# Patient Record
Sex: Female | Born: 1945 | Race: Black or African American | Hispanic: No | Smoking: Former smoker
Health system: Southern US, Community
[De-identification: ages and names within clinical notes are randomized; demographics above are authoritative.]

## PROBLEM LIST (undated history)

## (undated) DIAGNOSIS — R011 Cardiac murmur, unspecified: Secondary | ICD-10-CM

## (undated) DIAGNOSIS — C50919 Malignant neoplasm of unspecified site of unspecified female breast: Secondary | ICD-10-CM

## (undated) DIAGNOSIS — I82409 Acute embolism and thrombosis of unspecified deep veins of unspecified lower extremity: Secondary | ICD-10-CM

## (undated) DIAGNOSIS — K219 Gastro-esophageal reflux disease without esophagitis: Secondary | ICD-10-CM

## (undated) DIAGNOSIS — R51 Headache: Secondary | ICD-10-CM

## (undated) DIAGNOSIS — R6889 Other general symptoms and signs: Secondary | ICD-10-CM

## (undated) DIAGNOSIS — G4730 Sleep apnea, unspecified: Secondary | ICD-10-CM

## (undated) DIAGNOSIS — J189 Pneumonia, unspecified organism: Secondary | ICD-10-CM

## (undated) DIAGNOSIS — I509 Heart failure, unspecified: Secondary | ICD-10-CM

## (undated) DIAGNOSIS — I5020 Unspecified systolic (congestive) heart failure: Secondary | ICD-10-CM

## (undated) DIAGNOSIS — I10 Essential (primary) hypertension: Secondary | ICD-10-CM

## (undated) DIAGNOSIS — E669 Obesity, unspecified: Secondary | ICD-10-CM

## (undated) DIAGNOSIS — B353 Tinea pedis: Secondary | ICD-10-CM

## (undated) DIAGNOSIS — Z9221 Personal history of antineoplastic chemotherapy: Secondary | ICD-10-CM

## (undated) DIAGNOSIS — M2550 Pain in unspecified joint: Secondary | ICD-10-CM

## (undated) DIAGNOSIS — I5032 Chronic diastolic (congestive) heart failure: Secondary | ICD-10-CM

## (undated) DIAGNOSIS — T4145XA Adverse effect of unspecified anesthetic, initial encounter: Secondary | ICD-10-CM

## (undated) DIAGNOSIS — G629 Polyneuropathy, unspecified: Secondary | ICD-10-CM

## (undated) DIAGNOSIS — Z923 Personal history of irradiation: Secondary | ICD-10-CM

## (undated) DIAGNOSIS — R269 Unspecified abnormalities of gait and mobility: Secondary | ICD-10-CM

## (undated) DIAGNOSIS — R609 Edema, unspecified: Secondary | ICD-10-CM

## (undated) DIAGNOSIS — N189 Chronic kidney disease, unspecified: Secondary | ICD-10-CM

## (undated) DIAGNOSIS — H9190 Unspecified hearing loss, unspecified ear: Secondary | ICD-10-CM

## (undated) DIAGNOSIS — R519 Headache, unspecified: Secondary | ICD-10-CM

## (undated) HISTORY — PX: CATARACT EXTRACTION: SUR2

## (undated) HISTORY — DX: Acute embolism and thrombosis of unspecified deep veins of unspecified lower extremity: I82.409

## (undated) HISTORY — DX: Chronic kidney disease, unspecified: N18.9

## (undated) HISTORY — DX: Unspecified hearing loss, unspecified ear: H91.90

## (undated) HISTORY — DX: Essential (primary) hypertension: I10

## (undated) HISTORY — DX: Other general symptoms and signs: R68.89

## (undated) HISTORY — DX: Edema, unspecified: R60.9

## (undated) HISTORY — DX: Unspecified systolic (congestive) heart failure: I50.20

## (undated) HISTORY — DX: Tinea pedis: B35.3

## (undated) HISTORY — DX: Pain in unspecified joint: M25.50

## (undated) HISTORY — PX: HERNIA REPAIR: SHX51

## (undated) HISTORY — DX: Obesity, unspecified: E66.9

## (undated) HISTORY — DX: Sleep apnea, unspecified: G47.30

## (undated) HISTORY — DX: Polyneuropathy, unspecified: G62.9

## (undated) HISTORY — DX: Chronic diastolic (congestive) heart failure: I50.32

## (undated) HISTORY — DX: Malignant neoplasm of unspecified site of unspecified female breast: C50.919

## (undated) HISTORY — DX: Heart failure, unspecified: I50.9

## (undated) HISTORY — DX: Unspecified abnormalities of gait and mobility: R26.9

## (undated) HISTORY — PX: TUBAL LIGATION: SHX77

## (undated) HISTORY — DX: Gastro-esophageal reflux disease without esophagitis: K21.9

---
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
## 2004-09-08 DIAGNOSIS — C50919 Malignant neoplasm of unspecified site of unspecified female breast: Secondary | ICD-10-CM

## 2004-09-08 DIAGNOSIS — T8859XA Other complications of anesthesia, initial encounter: Secondary | ICD-10-CM

## 2004-09-08 DIAGNOSIS — Z9221 Personal history of antineoplastic chemotherapy: Secondary | ICD-10-CM

## 2004-09-08 DIAGNOSIS — Z923 Personal history of irradiation: Secondary | ICD-10-CM

## 2004-09-08 HISTORY — DX: Malignant neoplasm of unspecified site of unspecified female breast: C50.919

## 2004-09-08 HISTORY — DX: Personal history of antineoplastic chemotherapy: Z92.21

## 2004-09-08 HISTORY — DX: Other complications of anesthesia, initial encounter: T88.59XA

## 2004-09-08 HISTORY — PX: BREAST LUMPECTOMY: SHX2

## 2004-09-08 HISTORY — DX: Personal history of irradiation: Z92.3

## 2004-11-06 ENCOUNTER — Other Ambulatory Visit: Payer: Self-pay

## 2004-11-06 ENCOUNTER — Emergency Department: Payer: Self-pay | Admitting: Emergency Medicine

## 2004-12-25 ENCOUNTER — Ambulatory Visit: Payer: Self-pay | Admitting: Unknown Physician Specialty

## 2005-01-14 ENCOUNTER — Ambulatory Visit: Payer: Self-pay | Admitting: General Surgery

## 2005-01-31 ENCOUNTER — Ambulatory Visit: Payer: Self-pay | Admitting: General Surgery

## 2005-02-05 ENCOUNTER — Ambulatory Visit: Payer: Self-pay | Admitting: General Surgery

## 2005-02-21 ENCOUNTER — Ambulatory Visit: Payer: Self-pay | Admitting: Oncology

## 2005-02-25 ENCOUNTER — Other Ambulatory Visit: Payer: Self-pay

## 2005-02-28 ENCOUNTER — Ambulatory Visit: Payer: Self-pay | Admitting: General Surgery

## 2005-03-08 ENCOUNTER — Ambulatory Visit: Payer: Self-pay | Admitting: Oncology

## 2005-04-08 ENCOUNTER — Ambulatory Visit: Payer: Self-pay | Admitting: Oncology

## 2005-05-09 ENCOUNTER — Ambulatory Visit: Payer: Self-pay | Admitting: Oncology

## 2005-06-08 ENCOUNTER — Ambulatory Visit: Payer: Self-pay | Admitting: Oncology

## 2005-07-09 ENCOUNTER — Ambulatory Visit: Payer: Self-pay | Admitting: Oncology

## 2005-08-08 ENCOUNTER — Ambulatory Visit: Payer: Self-pay | Admitting: Oncology

## 2005-09-08 ENCOUNTER — Ambulatory Visit: Payer: Self-pay | Admitting: Oncology

## 2005-10-09 ENCOUNTER — Ambulatory Visit: Payer: Self-pay | Admitting: Oncology

## 2005-11-06 ENCOUNTER — Ambulatory Visit: Payer: Self-pay | Admitting: Oncology

## 2005-12-07 ENCOUNTER — Ambulatory Visit: Payer: Self-pay | Admitting: Oncology

## 2006-01-06 ENCOUNTER — Ambulatory Visit: Payer: Self-pay | Admitting: Oncology

## 2006-02-06 ENCOUNTER — Ambulatory Visit: Payer: Self-pay | Admitting: Oncology

## 2006-03-12 ENCOUNTER — Ambulatory Visit: Payer: Self-pay | Admitting: Oncology

## 2006-03-17 ENCOUNTER — Ambulatory Visit: Payer: Self-pay | Admitting: General Surgery

## 2006-04-01 ENCOUNTER — Ambulatory Visit: Payer: Self-pay | Admitting: Gastroenterology

## 2006-04-08 ENCOUNTER — Ambulatory Visit: Payer: Self-pay | Admitting: Oncology

## 2006-05-19 ENCOUNTER — Ambulatory Visit: Payer: Self-pay | Admitting: General Surgery

## 2006-05-19 ENCOUNTER — Other Ambulatory Visit: Payer: Self-pay

## 2006-05-25 ENCOUNTER — Ambulatory Visit: Payer: Self-pay | Admitting: General Surgery

## 2006-05-25 HISTORY — PX: PORT-A-CATH REMOVAL: SHX5289

## 2006-05-25 HISTORY — PX: HERNIA REPAIR: SHX51

## 2006-07-06 ENCOUNTER — Ambulatory Visit: Payer: Self-pay | Admitting: Oncology

## 2006-07-09 ENCOUNTER — Ambulatory Visit: Payer: Self-pay | Admitting: Oncology

## 2006-11-25 ENCOUNTER — Ambulatory Visit: Payer: Self-pay | Admitting: Oncology

## 2006-12-08 ENCOUNTER — Ambulatory Visit: Payer: Self-pay | Admitting: Internal Medicine

## 2006-12-08 ENCOUNTER — Ambulatory Visit: Payer: Self-pay | Admitting: Oncology

## 2007-01-07 ENCOUNTER — Ambulatory Visit: Payer: Self-pay | Admitting: Oncology

## 2007-01-07 ENCOUNTER — Ambulatory Visit: Payer: Self-pay | Admitting: Internal Medicine

## 2007-02-07 ENCOUNTER — Ambulatory Visit: Payer: Self-pay | Admitting: Internal Medicine

## 2007-02-07 ENCOUNTER — Ambulatory Visit: Payer: Self-pay | Admitting: Oncology

## 2007-03-09 ENCOUNTER — Ambulatory Visit: Payer: Self-pay | Admitting: Internal Medicine

## 2007-03-10 ENCOUNTER — Ambulatory Visit: Payer: Self-pay | Admitting: Oncology

## 2007-03-24 ENCOUNTER — Ambulatory Visit: Payer: Self-pay | Admitting: Family Medicine

## 2007-04-09 ENCOUNTER — Ambulatory Visit: Payer: Self-pay | Admitting: Internal Medicine

## 2007-04-09 ENCOUNTER — Ambulatory Visit: Payer: Self-pay | Admitting: Oncology

## 2007-05-10 ENCOUNTER — Ambulatory Visit: Payer: Self-pay | Admitting: Oncology

## 2007-06-04 ENCOUNTER — Ambulatory Visit: Payer: Self-pay | Admitting: Oncology

## 2007-06-09 ENCOUNTER — Ambulatory Visit: Payer: Self-pay | Admitting: Oncology

## 2007-09-09 ENCOUNTER — Ambulatory Visit: Payer: Self-pay | Admitting: Oncology

## 2007-10-07 ENCOUNTER — Ambulatory Visit: Payer: Self-pay | Admitting: Oncology

## 2007-10-10 ENCOUNTER — Ambulatory Visit: Payer: Self-pay | Admitting: Oncology

## 2007-12-08 ENCOUNTER — Ambulatory Visit: Payer: Self-pay | Admitting: Oncology

## 2007-12-30 ENCOUNTER — Ambulatory Visit: Payer: Self-pay | Admitting: Oncology

## 2008-01-07 ENCOUNTER — Ambulatory Visit: Payer: Self-pay | Admitting: Oncology

## 2008-03-08 ENCOUNTER — Ambulatory Visit: Payer: Self-pay | Admitting: Oncology

## 2008-03-30 ENCOUNTER — Ambulatory Visit: Payer: Self-pay | Admitting: Oncology

## 2008-04-08 ENCOUNTER — Ambulatory Visit: Payer: Self-pay | Admitting: Oncology

## 2008-06-15 ENCOUNTER — Ambulatory Visit: Payer: Self-pay | Admitting: Ophthalmology

## 2008-06-15 ENCOUNTER — Other Ambulatory Visit: Payer: Self-pay

## 2008-07-03 ENCOUNTER — Ambulatory Visit: Payer: Self-pay | Admitting: Ophthalmology

## 2008-10-19 ENCOUNTER — Ambulatory Visit: Payer: Self-pay | Admitting: Oncology

## 2008-11-06 ENCOUNTER — Ambulatory Visit: Payer: Self-pay | Admitting: Oncology

## 2008-11-06 ENCOUNTER — Ambulatory Visit: Payer: Self-pay

## 2008-11-20 ENCOUNTER — Inpatient Hospital Stay: Payer: Self-pay | Admitting: Internal Medicine

## 2009-04-09 ENCOUNTER — Ambulatory Visit: Payer: Self-pay | Admitting: Oncology

## 2009-04-19 ENCOUNTER — Ambulatory Visit: Payer: Self-pay | Admitting: Oncology

## 2009-05-09 ENCOUNTER — Ambulatory Visit: Payer: Self-pay | Admitting: Oncology

## 2010-01-06 ENCOUNTER — Ambulatory Visit: Payer: Self-pay | Admitting: Oncology

## 2010-01-23 ENCOUNTER — Ambulatory Visit: Payer: Self-pay | Admitting: Oncology

## 2010-02-06 ENCOUNTER — Ambulatory Visit: Payer: Self-pay | Admitting: Oncology

## 2010-05-30 ENCOUNTER — Ambulatory Visit: Payer: Self-pay | Admitting: Oncology

## 2010-08-06 ENCOUNTER — Encounter: Payer: Self-pay | Admitting: Family Medicine

## 2010-08-22 ENCOUNTER — Ambulatory Visit: Payer: Self-pay | Admitting: Oncology

## 2010-08-23 LAB — CANCER ANTIGEN 27.29: CA 27.29: 12.3 U/mL (ref 0.0–38.6)

## 2010-08-29 ENCOUNTER — Inpatient Hospital Stay: Payer: Self-pay | Admitting: *Deleted

## 2010-09-08 ENCOUNTER — Ambulatory Visit: Payer: Self-pay | Admitting: Oncology

## 2011-02-04 ENCOUNTER — Ambulatory Visit: Payer: Self-pay | Admitting: Oncology

## 2011-02-05 LAB — CANCER ANTIGEN 27.29: CA 27.29: 18 U/mL (ref 0.0–38.6)

## 2011-02-07 ENCOUNTER — Ambulatory Visit: Payer: Self-pay | Admitting: Oncology

## 2011-06-03 ENCOUNTER — Ambulatory Visit: Payer: Self-pay | Admitting: Oncology

## 2011-06-09 ENCOUNTER — Ambulatory Visit: Payer: Self-pay | Admitting: Oncology

## 2011-07-01 ENCOUNTER — Ambulatory Visit: Payer: Self-pay | Admitting: Cardiovascular Disease

## 2011-07-15 ENCOUNTER — Encounter: Payer: Self-pay | Admitting: Cardiovascular Disease

## 2011-07-15 ENCOUNTER — Other Ambulatory Visit: Payer: Medicare PPO | Admitting: *Deleted

## 2011-07-15 ENCOUNTER — Ambulatory Visit (INDEPENDENT_AMBULATORY_CARE_PROVIDER_SITE_OTHER): Payer: Medicare PPO | Admitting: Cardiovascular Disease

## 2011-07-15 DIAGNOSIS — R0602 Shortness of breath: Secondary | ICD-10-CM | POA: Insufficient documentation

## 2011-07-15 DIAGNOSIS — R609 Edema, unspecified: Secondary | ICD-10-CM | POA: Insufficient documentation

## 2011-07-15 DIAGNOSIS — I10 Essential (primary) hypertension: Secondary | ICD-10-CM

## 2011-07-15 DIAGNOSIS — I509 Heart failure, unspecified: Secondary | ICD-10-CM | POA: Insufficient documentation

## 2011-07-15 MED ORDER — ISOSORBIDE MONONITRATE ER 60 MG PO TB24: 60.0000 mg | ORAL_TABLET | Freq: Two times a day (BID) | ORAL | Status: DC

## 2011-07-15 MED ORDER — HYDRALAZINE HCL 100 MG PO TABS: 100.0000 mg | ORAL_TABLET | Freq: Four times a day (QID) | ORAL | Status: DC

## 2011-07-15 NOTE — Patient Instructions (Addendum)
Please increase the isosorbide to 60 mg twice a day (2 in the Am and PM of the 30 mg pills) Take only one of the isosorbide 60 mg twice a day  Please increase the hydralazine to 100 mg four times a day (breakfast, lunch and dinner, before bed)  Please call us if you have new issues that need to be addressed before your next appt.  The office will contact you for a follow up Appt. In 1 months

## 2011-07-16 DIAGNOSIS — I10 Essential (primary) hypertension: Secondary | ICD-10-CM | POA: Insufficient documentation

## 2011-07-16 NOTE — Progress Notes (Signed)
Patient ID: [name], female    DOB: Apr 01, 1946, 65 y.o.   MRN: [id number]  HPI Comments: [name] is a very pleasant 65 year old woman with morbid obesity, severe lower extremity edema, notes indicating a history of CHF, sleep apnea, pulmonary disease, moderately depressed ejection fraction with global hypokinesis, severe hypertension who presents by referral from Dr. Allena Katz for evaluation of CHF and hypertension.  [name] reports that she has difficulty lying flat. She has to use several pillows. Her leg edema has been severe tear she was recently started on Lasix, 80 mg in the morning and 40 mg in the afternoon. She has not noticed a tremendous difference in her lower extremity edema. She has not been tracking her blood pressure at home. She reports that her sister has even worsened lower extremity edema. She has had a history of pneumonia in the past. Also has history of many strokes. She reports that she was seen previously outside [facility] headache clinic though she does not remember the name  She is unable to walk her car secondary to shortness of breath.. She denies any cough, lightheadedness, no chest pain.   Lab work from earlier in March of this year shows creatinine 0.68, normal LFTs, BNP 420  EKG shows normal sinus rhythm with rate 80 beats per minute with frequent PVCs, nonspecific ST changes in V4 through V6, one and aVL   Outpatient Encounter Prescriptions as of 07/15/2011  Medication Sig Dispense Refill  . aspirin EC 81 MG tablet Take 81 mg by mouth daily.        . enalapril (VASOTEC) 20 MG tablet Take 20 mg by mouth daily.        . Fluticasone-Salmeterol (ADVAIR) 250-50 MCG/DOSE AEPB Inhale 1 puff into the lungs every 12 (twelve) hours.        . furosemide (LASIX) 40 MG tablet Take two in the am & one tablet in the pm       . hydrALAZINE (APRESOLINE) 100 MG tablet Take 1 tablet (100 mg total) by mouth 4 (four) times daily.  120 tablet  6  . potassium chloride SA  (K-DUR,KLOR-CON) 20 MEQ tablet Take 20 mEq by mouth daily.        .  hydrALAZINE (APRESOLINE) 50 MG tablet Take 50 mg by mouth 3 (three) times daily.        .  isosorbide mononitrate (IMDUR) 30 MG 24 hr tablet Take 30 mg by mouth daily.          Review of Systems  Constitutional: Negative.   HENT: Negative.   Eyes: Negative.   Respiratory: Positive for cough, choking and shortness of breath.   Cardiovascular: Positive for leg swelling.  Gastrointestinal: Negative.   Musculoskeletal: Negative.   Skin: Negative.   Neurological: Negative.   Hematological: Negative.   Psychiatric/Behavioral: Negative.   All other systems reviewed and are negative.    BP 160/100  Pulse 80  Ht 5\' 10"  (1.778 m)  Wt 313 lb (141.976 kg)  BMI 44.91 kg/m2  Physical Exam  Nursing note and vitals reviewed. Constitutional: She is oriented to person, place, and time. She appears well-developed and well-nourished.       Morbidly obese  HENT:  Head: Normocephalic.  Nose: Nose normal.  Mouth/Throat: Oropharynx is clear and moist.  Eyes: Conjunctivae are normal. Pupils are equal, round, and reactive to light.  Neck: Normal range of motion. Neck supple. JVD present.  Cardiovascular: Normal rate, regular rhythm, S1 normal, S2 normal and intact  distal pulses.  Exam reveals no gallop and no friction rub.   Murmur heard.  Crescendo systolic murmur is present with a grade of 2/6       2+ pitting edema to below the knees bilaterally  Pulmonary/Chest: Effort normal and breath sounds normal. No respiratory distress. She has no wheezes. She has no rales. She exhibits no tenderness.  Abdominal: Soft. Bowel sounds are normal. She exhibits no distension. There is no tenderness.  Musculoskeletal: Normal range of motion. She exhibits edema. She exhibits no tenderness.  Lymphadenopathy:    She has no cervical adenopathy.  Neurological: She is alert and oriented to person, place, and time. Coordination normal.  Skin:  Skin is warm and dry. No rash noted. No erythema.  Psychiatric: She has a normal mood and affect. Her behavior is normal. Judgment and thought content normal.         Assessment and Plan

## 2011-07-16 NOTE — Assessment & Plan Note (Addendum)
We will double her isosorbide to 30 mg b.i.d., increase her hydralazine to 100 mg q.i.d.. Continue the furosemide and ACE inhibitor. On her next visit, we will add low dose carvedilol.

## 2011-07-16 NOTE — Assessment & Plan Note (Signed)
Her shortness of breath is quite debilitating. This is likely multifactorial from systolic heart failure, severe hypertension, obesity and deconditioning

## 2011-07-16 NOTE — Assessment & Plan Note (Addendum)
Echocardiogram was done in the office today that showed severely depressed LV function appeared global. Ejection fraction estimated less than 25%. Left ventricle was dilated. Also with severely elevated right ventricular systolic pressures estimated at 70 mmHg.   We work on improving her blood pressure as she could have a prior myopathy secondary to severe chronic hypertension. We will need to continue ACE inhibitor and diuretic. I've instructed her on salt intake and fluid intake.  We will see her back in several weeks time for further medication titration.

## 2011-07-16 NOTE — Assessment & Plan Note (Signed)
Edema is likely secondary to severe pulmonary hypertension. We have talked about leg elevation, TED hose, Ace wrapping and diuresis.

## 2011-07-30 ENCOUNTER — Ambulatory Visit: Payer: Self-pay | Admitting: Oncology

## 2011-08-07 ENCOUNTER — Telehealth: Payer: Self-pay | Admitting: *Deleted

## 2011-08-07 NOTE — Telephone Encounter (Signed)
[facility] unable to reach pt after several phone call attempts and messages left. Pt has f/u with you again on 12/10, we can ask her if she still wants visits? This was for polypharmacy. Thanks.

## 2011-08-10 NOTE — Telephone Encounter (Signed)
Yes, lets follow up. thx

## 2011-08-18 ENCOUNTER — Ambulatory Visit: Payer: Medicare PPO | Admitting: Cardiovascular Disease

## 2011-09-08 ENCOUNTER — Ambulatory Visit: Payer: Medicare PPO | Admitting: Cardiovascular Disease

## 2011-09-19 ENCOUNTER — Ambulatory Visit: Payer: Medicare PPO | Admitting: Cardiovascular Disease

## 2011-10-17 ENCOUNTER — Ambulatory Visit: Payer: Self-pay | Admitting: Family Medicine

## 2011-11-11 ENCOUNTER — Ambulatory Visit: Payer: Medicare PPO | Admitting: Cardiovascular Disease

## 2011-12-01 ENCOUNTER — Ambulatory Visit: Payer: Self-pay | Admitting: Oncology

## 2011-12-01 LAB — CBC CANCER CENTER
Basophil %: 0.3 %
Eosinophil #: 0 x10 3/mm (ref 0.0–0.7)
HCT: 44.1 % (ref 35.0–47.0)
HGB: 14.6 g/dL (ref 12.0–16.0)
Lymphocyte %: 19.9 %
MCH: 28.2 pg (ref 26.0–34.0)
MCHC: 33 g/dL (ref 32.0–36.0)
Monocyte #: 0.5 x10 3/mm (ref 0.0–0.7)
Neutrophil #: 4.8 x10 3/mm (ref 1.4–6.5)
Neutrophil %: 71.7 %
Platelet: 275 x10 3/mm (ref 150–440)
WBC: 6.7 x10 3/mm (ref 3.6–11.0)

## 2011-12-01 LAB — COMPREHENSIVE METABOLIC PANEL
Albumin: 3.7 g/dL (ref 3.4–5.0)
Calcium, Total: 8.8 mg/dL (ref 8.5–10.1)
Co2: 28 mmol/L (ref 21–32)
Creatinine: 0.77 mg/dL (ref 0.60–1.30)
EGFR (African American): >60
Glucose: 91 mg/dL (ref 65–99)
Potassium: 3.9 mmol/L (ref 3.5–5.1)
Total Protein: 9.1 g/dL — ABNORMAL HIGH (ref 6.4–8.2)

## 2011-12-02 LAB — CANCER ANTIGEN 27.29: CA 27.29: 26.8 U/mL (ref 0.0–38.6)

## 2011-12-08 ENCOUNTER — Ambulatory Visit: Payer: Self-pay | Admitting: Oncology

## 2012-01-21 ENCOUNTER — Ambulatory Visit: Payer: Medicare PPO | Admitting: Cardiovascular Disease

## 2012-08-22 ENCOUNTER — Emergency Department: Payer: Self-pay | Admitting: Emergency Medicine

## 2012-08-22 LAB — CBC WITH DIFFERENTIAL/PLATELET
Basophil %: 1.5 %
Eosinophil #: 0 10*3/uL (ref 0.0–0.7)
Eosinophil %: 0.7 %
HCT: 39.3 % (ref 35.0–47.0)
Lymphocyte #: 1.4 10*3/uL (ref 1.0–3.6)
MCH: 28 pg (ref 26.0–34.0)
MCHC: 33 g/dL (ref 32.0–36.0)
MCV: 85 fL (ref 80–100)
Monocyte #: 0.5 x10 3/mm (ref 0.2–0.9)
Monocyte %: 7.5 %
Neutrophil #: 4.3 10*3/uL (ref 1.4–6.5)
Platelet: 235 10*3/uL (ref 150–440)
RBC: 4.62 10*6/uL (ref 3.80–5.20)

## 2012-08-22 LAB — COMPREHENSIVE METABOLIC PANEL
Anion Gap: 3 — ABNORMAL LOW (ref 7–16)
Bilirubin,Total: 0.5 mg/dL (ref 0.2–1.0)
Calcium, Total: 8.8 mg/dL (ref 8.5–10.1)
Chloride: 106 mmol/L (ref 98–107)
EGFR (African American): >60
Glucose: 101 mg/dL — ABNORMAL HIGH (ref 65–99)
Osmolality: 278 (ref 275–301)
Potassium: 3.9 mmol/L (ref 3.5–5.1)
SGOT(AST): 22 U/L (ref 15–37)

## 2012-11-29 ENCOUNTER — Ambulatory Visit: Payer: Self-pay | Admitting: Oncology

## 2012-12-06 ENCOUNTER — Other Ambulatory Visit: Payer: Self-pay | Admitting: Cardiovascular Disease

## 2012-12-06 NOTE — Telephone Encounter (Signed)
Refilled Hydralazine with no refills. Pt has not been seen since 2012 pt was scheduled for future appointment on 12/30/12.

## 2012-12-09 ENCOUNTER — Ambulatory Visit: Payer: Self-pay | Admitting: Oncology

## 2012-12-30 ENCOUNTER — Ambulatory Visit: Payer: Medicare PPO | Admitting: Cardiovascular Disease

## 2013-02-03 ENCOUNTER — Ambulatory Visit: Payer: Medicare PPO | Admitting: Cardiovascular Disease

## 2013-02-09 ENCOUNTER — Other Ambulatory Visit: Payer: Self-pay | Admitting: Cardiovascular Disease

## 2013-02-09 NOTE — Telephone Encounter (Signed)
[facility] patient needs to make a follow up appointment with Dr. Mariah Milling to refill this medication.

## 2013-03-12 ENCOUNTER — Other Ambulatory Visit: Payer: Self-pay | Admitting: Cardiovascular Disease

## 2013-03-14 ENCOUNTER — Other Ambulatory Visit: Payer: Self-pay | Admitting: *Deleted

## 2013-03-14 MED ORDER — HYDRALAZINE HCL 100 MG PO TABS: ORAL_TABLET | ORAL | Status: DC

## 2013-03-14 NOTE — Telephone Encounter (Signed)
Pt has not been seen since 2012 pt is aware that pt must be seen atleast once a year to continue refill. Pt has scheduled appointment for 03/24/13. Refilled Hydralazine no refills.

## 2013-03-24 ENCOUNTER — Ambulatory Visit: Payer: Medicare PPO | Admitting: Cardiovascular Disease

## 2013-04-04 ENCOUNTER — Ambulatory Visit: Payer: Medicare PPO | Admitting: Cardiovascular Disease

## 2013-04-20 ENCOUNTER — Encounter: Payer: Self-pay | Admitting: Cardiovascular Disease

## 2013-04-20 ENCOUNTER — Ambulatory Visit (INDEPENDENT_AMBULATORY_CARE_PROVIDER_SITE_OTHER): Payer: Medicare PPO | Admitting: Cardiovascular Disease

## 2013-04-20 VITALS — BP 180/82 | HR 70 | Ht 70.0 in | Wt 265.5 lb

## 2013-04-20 DIAGNOSIS — I10 Essential (primary) hypertension: Secondary | ICD-10-CM

## 2013-04-20 DIAGNOSIS — I5022 Chronic systolic (congestive) heart failure: Secondary | ICD-10-CM

## 2013-04-20 DIAGNOSIS — R0602 Shortness of breath: Secondary | ICD-10-CM

## 2013-04-20 DIAGNOSIS — R609 Edema, unspecified: Secondary | ICD-10-CM

## 2013-04-20 DIAGNOSIS — I509 Heart failure, unspecified: Secondary | ICD-10-CM

## 2013-04-20 MED ORDER — FUROSEMIDE 40 MG PO TABS: 80.0000 mg | ORAL_TABLET | Freq: Two times a day (BID) | ORAL | Status: DC

## 2013-04-20 MED ORDER — CLONIDINE HCL 0.1 MG PO TABS: 0.1000 mg | ORAL_TABLET | Freq: Two times a day (BID) | ORAL | Status: DC

## 2013-04-20 MED ORDER — HYDRALAZINE HCL 100 MG PO TABS: ORAL_TABLET | ORAL | Status: DC

## 2013-04-20 MED ORDER — CARVEDILOL 3.125 MG PO TABS: 3.1250 mg | ORAL_TABLET | Freq: Two times a day (BID) | ORAL | Status: DC

## 2013-04-20 MED ORDER — ENALAPRIL MALEATE 20 MG PO TABS: 20.0000 mg | ORAL_TABLET | Freq: Every day | ORAL | Status: DC

## 2013-04-20 MED ORDER — ISOSORBIDE MONONITRATE ER 60 MG PO TB24: 60.0000 mg | ORAL_TABLET | Freq: Two times a day (BID) | ORAL | Status: DC

## 2013-04-20 NOTE — Assessment & Plan Note (Signed)
Appears to be doing much better. No orthopnea or PND. We'll continue her on her current Lasix regimen 80 mg in the morning and 40 mg in the p.m. We'll check basic metabolic panel today. 

## 2013-04-20 NOTE — Progress Notes (Signed)
Patient ID: [name], female    DOB: [birth year], 67 y.o.   MRN: [id number]  HPI Comments: [name] is a very pleasant 67 year old woman with morbid obesity, severe lower extremity edema, systolic CHF, sleep apnea, pulmonary disease, moderately depressed ejection fraction with global hypokinesis, severe hypertension patient of Dr.  Allena Katz who presents for routine followup of CHF.  She's not been seen since November 2012. Since that time, she has remained on her medications as previously adjusted. Her weight is down approximately 50 pounds. She reports continued lower extremity edema, improved from the past. She does continue to have occasional skin breakdown,  weeping sores, worse after spending time on her feet she denies any significant shortness of breath. No orthopnea or PND. No recent blood work in the past 2 years. Legs are getting weaker. No recent falls.   She denies any cough, lightheadedness, no chest pain.   EKG shows normal sinus rhythm with rate 70 beats per minute with frequent PVCs, nonspecific ST changes in V4 through V6, one and aVL   Outpatient Encounter Prescriptions as of 04/20/2013  Medication Sig Dispense Refill  . aspirin EC 81 MG tablet Take 81 mg by mouth daily.        . enalapril (VASOTEC) 20 MG tablet Take 1 tablet (20 mg total) by mouth daily.  90 tablet  3  . Fluticasone-Salmeterol (ADVAIR) 250-50 MCG/DOSE AEPB Inhale 1 puff into the lungs every 12 (twelve) hours.        . furosemide (LASIX) 40 MG tablet Take 2 tablets (80 mg total) by mouth in the a.m., 40 mg in the p.m.   240 tablet  6  . hydrALAZINE (APRESOLINE) 100 MG tablet TAKE 1 TABLET (100 MG TOTAL) BY MOUTH 4 (FOUR) TIMES DAILY.  240 tablet  6  . isosorbide mononitrate (IMDUR) 60 MG 24 hr tablet Take 1 tablet (60 mg total) by mouth 2 (two) times daily.  180 tablet  3  . potassium chloride SA (K-DUR,KLOR-CON) 20 MEQ tablet Take 20 mEq by mouth daily.          Review of Systems  Constitutional:  Negative.   HENT: Negative.   Eyes: Negative.   Cardiovascular: Positive for leg swelling.  Gastrointestinal: Negative.   Musculoskeletal: Positive for gait problem.  Skin: Negative.   Neurological: Negative.   Psychiatric/Behavioral: Negative.   All other systems reviewed and are negative.    BP 180/82  Pulse 70  Ht 5\' 10"  (1.778 m)  Wt 265 lb 8 oz (120.43 kg)  BMI 38.1 kg/m2  Physical Exam  Nursing note and vitals reviewed. Constitutional: She is oriented to person, place, and time. She appears well-developed and well-nourished.  Morbidly obese  HENT:  Head: Normocephalic.  Nose: Nose normal.  Mouth/Throat: Oropharynx is clear and moist.  Eyes: Conjunctivae are normal. Pupils are equal, round, and reactive to light.  Neck: Normal range of motion. Neck supple.  Cardiovascular: Normal rate, regular rhythm, S1 normal, S2 normal and intact distal pulses.  Exam reveals no gallop and no friction rub.   Murmur heard.  Crescendo systolic murmur is present with a grade of 2/6  1+ pitting edema to below the knees bilaterally  Pulmonary/Chest: Effort normal and breath sounds normal. No respiratory distress. She has no wheezes. She has no rales. She exhibits no tenderness.  Abdominal: Soft. Bowel sounds are normal. She exhibits no distension. There is no tenderness.  Musculoskeletal: Normal range of motion. She exhibits edema. She exhibits no tenderness.  Lymphadenopathy:    She has no cervical adenopathy.  Neurological: She is alert and oriented to person, place, and time. Coordination normal.  Skin: Skin is warm and dry. No rash noted. No erythema.  Psychiatric: She has a normal mood and affect. Her behavior is normal. Judgment and thought content normal.    Assessment and Plan

## 2013-04-20 NOTE — Assessment & Plan Note (Deleted)
Appears to be doing much better. No orthopnea or PND. We'll continue her on her current Lasix regimen 80 mg in the morning and 40 mg in the p.m. We'll check basic metabolic panel today.

## 2013-04-20 NOTE — Assessment & Plan Note (Signed)
Shortness of breath has improved. Likely from weight loss, improved diuresis, improved heart failure regimen. Still with some shortness of breath likely from poor control blood pressure.

## 2013-04-20 NOTE — Assessment & Plan Note (Signed)
Blood pressure is very elevated today. We'll have Coreg 3.125 mg twice a day, clonidine 0.1 mg twice a day. Carvedilol could be slowly titrated upwards, avoiding bradycardia.

## 2013-04-20 NOTE — Patient Instructions (Addendum)
Blood pressure is elevated today Please start coreg 3.125 mg twice a day for extra beats and  Blood pressure Also start clonidine 0.1 mg twice a day for high blood pressure  For leg swelling, try ACE wrap, compression hose, leg elevation Ok to take extra lasix in the afternoon if needed for worsening leg swelling  Please call us if you have new issues that need to be addressed before your next appt.  Your physician wants you to follow-up in: 6 months.  You will receive a reminder letter in the mail two months in advance. If you don't receive a letter, please call our office to schedule the follow-up appointment.

## 2013-04-20 NOTE — Assessment & Plan Note (Signed)
Suspect there is a strong component of dependent edema. Unable to exclude edema from systolic CHF. We'll continue Lasix 80 mg in the morning, 40 mg in the p.m. Extra Lasix for weeping sores or worsening leg edema.

## 2013-04-21 LAB — BASIC METABOLIC PANEL
BUN/Creatinine Ratio: 25 (ref 11–26)
BUN: 13 mg/dL (ref 8–27)
Chloride: 105 mmol/L (ref 97–108)
Creatinine, Ser: 0.51 mg/dL — ABNORMAL LOW (ref 0.57–1.00)
GFR calc Af Amer: 115 mL/min/{1.73_m2} (ref >59)
GFR calc non Af Amer: 100 mL/min/{1.73_m2} (ref >59)
Glucose: 84 mg/dL (ref 65–99)

## 2013-04-26 ENCOUNTER — Telehealth: Payer: Self-pay

## 2013-04-26 NOTE — Telephone Encounter (Signed)
See lab result note in EPIC.

## 2013-04-26 NOTE — Telephone Encounter (Signed)
Pt would like lab results.  

## 2013-05-11 ENCOUNTER — Ambulatory Visit: Payer: Self-pay | Admitting: Oncology

## 2013-11-11 ENCOUNTER — Ambulatory Visit: Payer: Medicare PPO | Admitting: Cardiovascular Disease

## 2013-12-21 ENCOUNTER — Emergency Department: Payer: Self-pay | Admitting: Emergency Medicine

## 2014-03-21 ENCOUNTER — Inpatient Hospital Stay: Payer: Self-pay | Admitting: Internal Medicine

## 2014-03-21 LAB — BASIC METABOLIC PANEL
ANION GAP: 8 (ref 7–16)
BUN: 14 mg/dL (ref 7–18)
Calcium, Total: 8.8 mg/dL (ref 8.5–10.1)
Chloride: 104 mmol/L (ref 98–107)
Co2: 28 mmol/L (ref 21–32)
Creatinine: 0.96 mg/dL (ref 0.60–1.30)
EGFR (African American): >60
Glucose: 136 mg/dL — ABNORMAL HIGH (ref 65–99)
Osmolality: 282 (ref 275–301)
Potassium: 3.5 mmol/L (ref 3.5–5.1)
Sodium: 140 mmol/L (ref 136–145)

## 2014-03-21 LAB — CBC
HCT: 37.4 % (ref 35.0–47.0)
HGB: 11.9 g/dL — ABNORMAL LOW (ref 12.0–16.0)
MCH: 28.2 pg (ref 26.0–34.0)
MCHC: 31.9 g/dL — ABNORMAL LOW (ref 32.0–36.0)
MCV: 88 fL (ref 80–100)
PLATELETS: 246 10*3/uL (ref 150–440)
RBC: 4.24 10*6/uL (ref 3.80–5.20)
RDW: 14.9 % — AB (ref 11.5–14.5)
WBC: 6.9 10*3/uL (ref 3.6–11.0)

## 2014-03-21 LAB — TROPONIN I: TROPONIN-I: 0.06 ng/mL — AB

## 2014-03-21 LAB — CK-MB: CK-MB: 1.2 ng/mL (ref 0.5–3.6)

## 2014-03-22 DIAGNOSIS — I359 Nonrheumatic aortic valve disorder, unspecified: Secondary | ICD-10-CM

## 2014-03-22 DIAGNOSIS — R55 Syncope and collapse: Secondary | ICD-10-CM

## 2014-03-22 DIAGNOSIS — I209 Angina pectoris, unspecified: Secondary | ICD-10-CM

## 2014-03-22 DIAGNOSIS — I10 Essential (primary) hypertension: Secondary | ICD-10-CM

## 2014-03-22 DIAGNOSIS — I5022 Chronic systolic (congestive) heart failure: Secondary | ICD-10-CM

## 2014-03-22 LAB — URINALYSIS, COMPLETE
BILIRUBIN, UR: NEGATIVE
BLOOD: NEGATIVE
GLUCOSE, UR: NEGATIVE mg/dL (ref 0–75)
Ketone: NEGATIVE
Leukocyte Esterase: NEGATIVE
NITRITE: NEGATIVE
PH: 5 (ref 4.5–8.0)
PROTEIN: NEGATIVE
RBC,UR: <1 /HPF (ref 0–5)
SPECIFIC GRAVITY: 1.02 (ref 1.003–1.030)
Squamous Epithelial: NONE SEEN
WBC UR: 3 /HPF (ref 0–5)

## 2014-03-22 LAB — BASIC METABOLIC PANEL
Anion Gap: 4 — ABNORMAL LOW (ref 7–16)
BUN: 17 mg/dL (ref 7–18)
Calcium, Total: 8.2 mg/dL — ABNORMAL LOW (ref 8.5–10.1)
Chloride: 107 mmol/L (ref 98–107)
Co2: 32 mmol/L (ref 21–32)
Creatinine: 0.99 mg/dL (ref 0.60–1.30)
EGFR (African American): >60
EGFR (Non-African Amer.): 59 — ABNORMAL LOW
GLUCOSE: 88 mg/dL (ref 65–99)
Osmolality: 286 (ref 275–301)
POTASSIUM: 3.4 mmol/L — AB (ref 3.5–5.1)
SODIUM: 143 mmol/L (ref 136–145)

## 2014-03-22 LAB — CBC WITH DIFFERENTIAL/PLATELET
BASOS ABS: 0 10*3/uL (ref 0.0–0.1)
BASOS PCT: 0.5 %
EOS ABS: 0 10*3/uL (ref 0.0–0.7)
EOS PCT: 0.4 %
HCT: 32.8 % — AB (ref 35.0–47.0)
HGB: 10.6 g/dL — AB (ref 12.0–16.0)
Lymphocyte #: 1.1 10*3/uL (ref 1.0–3.6)
Lymphocyte %: 18.8 %
MCH: 28.5 pg (ref 26.0–34.0)
MCHC: 32.3 g/dL (ref 32.0–36.0)
MCV: 88 fL (ref 80–100)
MONOS PCT: 11.2 %
Monocyte #: 0.7 x10 3/mm (ref 0.2–0.9)
Neutrophil #: 4.2 10*3/uL (ref 1.4–6.5)
Neutrophil %: 69.1 %
Platelet: 214 10*3/uL (ref 150–440)
RBC: 3.72 10*6/uL — AB (ref 3.80–5.20)
RDW: 15 % — ABNORMAL HIGH (ref 11.5–14.5)
WBC: 6.1 10*3/uL (ref 3.6–11.0)

## 2014-03-22 LAB — PROTIME-INR
INR: 1.1
Prothrombin Time: 13.9 secs (ref 11.5–14.7)

## 2014-03-22 LAB — CK-MB
CK-MB: 0.5 ng/mL (ref 0.5–3.6)
CK-MB: 0.9 ng/mL (ref 0.5–3.6)

## 2014-03-22 LAB — MAGNESIUM: Magnesium: 1.9 mg/dL

## 2014-03-22 LAB — HEPARIN LEVEL (UNFRACTIONATED): Anti-Xa(Unfractionated): <0.1 IU/mL — ABNORMAL LOW (ref 0.30–0.70)

## 2014-03-22 LAB — TROPONIN I
Troponin-I: 0.09 ng/mL — ABNORMAL HIGH
Troponin-I: 0.06 ng/mL — ABNORMAL HIGH

## 2014-03-22 LAB — APTT: ACTIVATED PTT: 28.6 s (ref 23.6–35.9)

## 2014-03-22 LAB — PRO B NATRIURETIC PEPTIDE: B-TYPE NATIURETIC PEPTID: 2863 pg/mL — AB (ref 0–125)

## 2014-03-23 DIAGNOSIS — I5043 Acute on chronic combined systolic (congestive) and diastolic (congestive) heart failure: Secondary | ICD-10-CM

## 2014-03-23 DIAGNOSIS — I498 Other specified cardiac arrhythmias: Secondary | ICD-10-CM

## 2014-03-23 DIAGNOSIS — R079 Chest pain, unspecified: Secondary | ICD-10-CM

## 2014-03-23 LAB — PLATELET COUNT: Platelet: 252 10*3/uL (ref 150–440)

## 2014-03-23 LAB — HEMOGLOBIN: HGB: 11.8 g/dL — ABNORMAL LOW (ref 12.0–16.0)

## 2014-03-23 LAB — HEPARIN LEVEL (UNFRACTIONATED): Anti-Xa(Unfractionated): 0.51 IU/mL (ref 0.30–0.70)

## 2014-03-24 ENCOUNTER — Telehealth: Payer: Self-pay

## 2014-03-24 DIAGNOSIS — R001 Bradycardia, unspecified: Secondary | ICD-10-CM

## 2014-03-24 NOTE — Telephone Encounter (Signed)
Per [place], [place], pt recently d/c'd from [facility]. Placed order for 30-day monitor thru [employer] for bradycardia.

## 2014-03-27 ENCOUNTER — Telehealth: Payer: Self-pay

## 2014-03-27 NOTE — Telephone Encounter (Signed)
Patient contacted regarding discharge from [facility] on 03/27/14.  Patient understands to follow up with Christell Faith, PA on 03/31/14 at 2:15 at [facility]. Patient understands discharge instructions? yes Patient understands medications and regiment? yes Patient understands to bring all medications to this visit? yes

## 2014-03-31 ENCOUNTER — Encounter: Payer: Self-pay | Admitting: Physician Assistant

## 2014-03-31 ENCOUNTER — Encounter: Payer: Medicare PPO | Admitting: Physician Assistant

## 2014-03-31 DIAGNOSIS — I498 Other specified cardiac arrhythmias: Secondary | ICD-10-CM

## 2014-03-31 NOTE — Progress Notes (Signed)
This encounter was created in error - please disregard.

## 2014-04-12 ENCOUNTER — Encounter: Payer: Self-pay | Admitting: *Deleted

## 2014-04-12 ENCOUNTER — Encounter: Payer: Medicare PPO | Admitting: Cardiovascular Disease

## 2014-05-08 ENCOUNTER — Telehealth: Payer: Self-pay

## 2014-05-08 NOTE — Telephone Encounter (Signed)
Attempted to contact pt regarding results of 30 day event monitor:  "NSR w/ frequent PVCs"  Left message for pt to call back.

## 2014-05-11 NOTE — Telephone Encounter (Signed)
Left message for pt to call back  °

## 2014-05-12 NOTE — Telephone Encounter (Signed)
Left message on pt's vm w/ results.  Asked her to call back w/ any questions or concerns.

## 2014-05-17 ENCOUNTER — Other Ambulatory Visit: Payer: Self-pay

## 2014-05-17 ENCOUNTER — Ambulatory Visit (INDEPENDENT_AMBULATORY_CARE_PROVIDER_SITE_OTHER): Payer: Medicare PPO

## 2014-05-17 DIAGNOSIS — I498 Other specified cardiac arrhythmias: Secondary | ICD-10-CM

## 2014-05-17 DIAGNOSIS — R001 Bradycardia, unspecified: Secondary | ICD-10-CM

## 2014-07-18 ENCOUNTER — Other Ambulatory Visit: Payer: Self-pay | Admitting: Cardiovascular Disease

## 2014-12-14 ENCOUNTER — Other Ambulatory Visit: Payer: Self-pay | Admitting: Cardiovascular Disease

## 2014-12-30 NOTE — Consult Note (Signed)
General Aspect 69 y/o F with h/o chronic systolic CHF (echo 37/8588: EF 20-25%, mild concentric hypertrophy, diffuse HK, regional WMA cannot be excluded, grade 1 DD, mitral valve with mild regurg, LA mildly dilated), CAD, reported h/o MI per previous notes (no ischemic w/u previously), CVA, HTN, obesity, breast CA s/p lumpectomy, chemo and radiation many years presented to [facility] 03/21/2014 with presyncopal episode followed by substernal chest pain with associated diaphoresis lasting 1 hour. Found to have elevated troponin of 0.06->0.09->0.06. ____________________________________________   Present Illness 69 y/o F with the above complex history presented to [facility] on 03/21/2014 with presyncopal episode while standing up after using the restroom. She suddenly felt dizzy and lightheaded forcing her to sit back down on the toilet. State she blacked out, but did not hit her head. Upon coming to she developed substernal chest pain with associated diaphoresis lasting 1 hour before self resolving. No associated nausea or vomiting. She denied any associated numbness, tingling, or weakness. She presented to the [facility] ED and was found to have a troponin of 0.06->0.09->06. She was given 4 aspirin 81 mg in the ED. She has remained chest pain free since that initial episode. Remaining labs are significant of a pro BNP of 2863, K+ 3.4, and hgb 10.6.   At baseline she is SOB and has DOE. She must take breaks from ambulating just short distances. Weight is down 20 pounds over the past years. Chronic lower extremity edema. She denies ever having had an ischemic work up done in the past.   She was last seen by Dr. Rockey Situ in clinic August 2014, previously patient of [facility]. At that time it had been since November 2012 since she had been seen. She was still taking her chronic medications including: aspirin 81 mg, advair 250/50, lasix 40 mg bid, hydralazine 100 mg qid, imdur 60 mg bid, k-dur 20 meq daily. Weight was down approx 50 pounds.  Reported continued lower extremity edema, improved from the past.   She did present to [facility] ED back in 2011 and was evaluated for SOB with sats at 89. Results does not go that far back. Notes indicate for patient to have an echo and to be ruled out for an MI. Patient very clearly states that she has never had a heart cath for stress test done.  Walks with a cane.   Physical Exam:  GEN well developed, well nourished, no acute distress   HEENT hearing intact to voice   NECK supple   RESP normal resp effort  clear BS   CARD Murmur   Murmur Systolic  2/6   ABD denies tenderness  soft  normal BS   LYMPH negative neck   EXTR negative cyanosis/clubbing, positive edema, Trace bilateral non-pitting edema along to the mid shin.   SKIN normal to palpation   NEURO cranial nerves intact   PSYCH alert, A+O to time, place, person, good insight   Review of Systems:  General: No Complaints  Weight loss or gain  Fatigue  Weakness  Chronic weight loss, see HPI.   Skin: No Complaints   ENT: No Complaints   Eyes: No Complaints   Neck: No Complaints   Respiratory: Short of breath   Cardiovascular: Chest pain or discomfort  Tightness  Dyspnea  Edema  Dizziness and lightheadedness   Gastrointestinal: No Complaints   Genitourinary: No Complaints   Vascular: No Complaints   Musculoskeletal: No Complaints   Neurologic: Dizzness  Fainting  Headache   Hematologic: No Complaints  Endocrine: No Complaints   Psychiatric: No Complaints   Review of Systems: All other systems were reviewed and found to be negative   Medications/Allergies Reviewed Medications/Allergies reviewed   Family & Social History:  Family and Social History:  Family History Hypertension  Diabetes Mellitus  Cancer   Social History negative ETOH, negative Illicit drugs   + Tobacco Prior (greater than 1 year)  Smoked 1/3 ppd for 6 years. Quit 1995.   Place of Living Home     CHF:    CVA/Stroke:     breast CA left:    MI - Myocardial Infarct:    irregular heartbeat:    Sleep Apnea:    Hypertension:    Tubal Ligation:   Home Medications: Medication Instructions Status  potassium chloride 20 mEq oral tablet, extended release    po daily Active  Lasix 40 mg oral tablet    po twice daily Active  hydrALAZINE 50 mg oral tablet    po every 6 hours (4 times daily) Active  Imdur 30 mg oral tablet, extended release    po daily Active  Vasotec 20 mg oral tablet    po twice daily Active  Norvasc 10 mg oral tablet    po daily Active  Metoprolol Succinate ER 50 mg oral tablet, extended release    po twice daily Active  aspirin 81 mg oral tablet    po daily Active   Lab Results:  Routine Chem:  14-Jul-15 19:36   Result Comment TROPONIN - RESULTS VERIFIED BY REPEAT TESTING.  - ELEVATED RESULT CALLED TO BRYAN GRAY  - 2021 03/21/14 SAL.  - READ-BACK PROCESS PERFORMED.  Result(s) reported on 21 Mar 2014 at 08:29PM.  Glucose, Serum  136  BUN 14  Creatinine (comp) 0.96  Sodium, Serum 140  Potassium, Serum 3.5  Chloride, Serum 104  CO2, Serum 28  Calcium (Total), Serum 8.8  Anion Gap 8  Osmolality (calc) 282  eGFR (African American) >60  eGFR (Non-African American) >60 (eGFR values <38m/min/1.73 m2 may be an indication of chronic kidney disease (CKD). Calculated eGFR is useful in patients with stable renal function. The eGFR calculation will not be reliable in acutely ill patients when serum creatinine is changing rapidly. It is not useful in  patients on dialysis. The eGFR calculation may not be applicable to patients at the low and high extremes of body sizes, pregnant women, and vegetarians.)  15-Jul-15 04:03   Result Comment TROPONIN - RESULTS VERIFIED BY REPEAT TESTING.  - PREVIOUS CALL:03/21/2014 AT 2021..Marland KitchenMarland KitchenPL  Result(s) reported on 22 Mar 2014 at 04:51AM.  Glucose, Serum 88  BUN 17  Creatinine (comp) 0.99  Sodium, Serum 143  Potassium, Serum  3.4  Chloride, Serum 107   CO2, Serum 32  Calcium (Total), Serum  8.2  Anion Gap  4  Osmolality (calc) 286  eGFR (African American) >60  eGFR (Non-African American)  59 (eGFR values <678mmin/1.73 m2 may be an indication of chronic kidney disease (CKD). Calculated eGFR is useful in patients with stable renal function. The eGFR calculation will not be reliable in acutely ill patients when serum creatinine is changing rapidly. It is not useful in  patients on dialysis. The eGFR calculation may not be applicable to patients at the low and high extremes of body sizes, pregnant women, and vegetarians.)  Magnesium, Serum 1.9 (1.8-2.4 THERAPEUTIC RANGE: 4-7 mg/dL TOXIC: > 10 mg/dL  -----------------------)  Cardiac:  14-Jul-15 19:36   CPK-MB, Serum 1.2 (Result(s) reported on 21 Mar 2014  at 11:19PM.)  Troponin I  0.06 (0.00-0.05 0.05 ng/mL or less: NEGATIVE  Repeat testing in 3-6 hrs  if clinically indicated. >0.05 ng/mL: POTENTIAL  MYOCARDIAL INJURY. Repeat  testing in 3-6 hrs if  clinically indicated. NOTE: An increase or decrease  of 30% or more on serial  testing suggests a  clinically important change)  15-Jul-15 04:03   CPK-MB, Serum 0.5 (Result(s) reported on 22 Mar 2014 at 04:38AM.)  Troponin I  0.06 (0.00-0.05 0.05 ng/mL or less: NEGATIVE  Repeat testing in 3-6 hrs  if clinically indicated. >0.05 ng/mL: POTENTIAL  MYOCARDIAL INJURY. Repeat  testing in 3-6 hrs if  clinically indicated. NOTE: An increase or decrease  of 30% or more on serial  testing suggests a  clinically important change)  Routine Hem:  14-Jul-15 19:36   WBC (CBC) 6.9  RBC (CBC) 4.24  Hemoglobin (CBC)  11.9  Hematocrit (CBC) 37.4  Platelet Count (CBC) 246 (Result(s) reported on 21 Mar 2014 at 07:54PM.)  MCV 88  MCH 28.2  MCHC  31.9  RDW  14.9  15-Jul-15 04:03   WBC (CBC) 6.1  RBC (CBC)  3.72  Hemoglobin (CBC)  10.6  Hematocrit (CBC)  32.8  Platelet Count (CBC) 214  MCV 88  MCH 28.5  MCHC 32.3  RDW  15.0   Neutrophil % 69.1  Lymphocyte % 18.8  Monocyte % 11.2  Eosinophil % 0.4  Basophil % 0.5  Neutrophil # 4.2  Lymphocyte # 1.1  Monocyte # 0.7  Eosinophil # 0.0  Basophil # 0.0 (Result(s) reported on 22 Mar 2014 at 04:17AM.)   EKG:  EKG Interp. by me   Interpretation NSR, 79, TMI V4-V6, PVC   Radiology Results: XRay:    14-Jul-15 19:59, Chest PA and Lateral  Chest PA and Lateral   REASON FOR EXAM:    chest pain with syncope  COMMENTS:   May transport without cardiac monitor    PROCEDURE: DXR - DXR CHEST PA (OR AP) AND LATERAL  - Mar 21 2014  7:59PM     CLINICAL DATA:  Syncopal episode with chest pain    EXAM:  CHEST  2 VIEW    COMPARISON:  08/22/2010    FINDINGS:  Cardiac shadow is enlarged. Mild vascular congestion is seen. No  focal infiltrate or sizable effusion is noted. No acute bony  abnormality is seen.     IMPRESSION:  Mild CHF.      Electronically Signed    By: Inez Catalina M.D.    On: 03/21/2014 20:01         Verified By: Everlene Farrier, M.D.,  CT:    15-Jul-15 10:47, CT Head Without Contrast  CT Head Without Contrast   REASON FOR EXAM:    pre syncope  COMMENTS:       PROCEDURE: CT  - CT HEAD WITHOUT CONTRAST  - Mar 22 2014 10:47AM     CLINICAL DATA:  History of left breast carcinoma, syncopal episodes    EXAM:  CT HEAD WITHOUT CONTRAST    TECHNIQUE:  Contiguous axial images were obtained from the base of the skull  through the vertex without intravenous contrast.    COMPARISON:  08/22/2012  FINDINGS:  The bony calvarium is intact. No gross soft tissue abnormality is.  The ventricles are normal in size and configuration. No findings to  suggest acute hemorrhage, acute infarction or space-occupying mass  lesion are noted.     IMPRESSION:  No acute abnormality seen.  Electronically Signed    By: Inez Catalina M.D.    On: 03/22/2014 10:50       Verified By: Everlene Farrier, M.D.,    No Known Allergies:   Vital Signs/Nurse's  Notes: **Vital Signs.:   15-Jul-15 04:18  Vital Signs Type Routine  Temperature Temperature (F) 98.8  Celsius 37.1  Temperature Source oral  Pulse Pulse 61  Respirations Respirations 18  Systolic BP Systolic BP 004  Diastolic BP (mmHg) Diastolic BP (mmHg) 71  Mean BP 90  Pulse Ox % Pulse Ox % 98  Pulse Ox Activity Level  At rest  Oxygen Delivery Room Air/ 21 %  *Intake and Output.:   Shift 15-Jul-15 15:00  Grand Totals Intake:  240 Output:      Net:  240 24 Hr.:  240  Oral Intake      In:  240  Length of Stay Totals Intake:  480 Output:      Net:  480    Impression 69 y/o F with chronic systolic CHF (EF 59-97% by echo 07/2011), reported history of CAD/ MI, HTN, hypokalemia, obesity, and h/o breast cancer s/p lumpectomy, chemo, and radiation who presented to [facility] ED on 03/21/2014 with presyncope and generalized weakness followed by an episode of chest pain lasting 1 hour with associated diaphoresis found to have mildly elevated troponins in the ED (0.06->0.09->0.06).  1. Angina: It does not appear that she has ever had an ischemic evaluation to date. Given her decreased EF of 74-14% she certainly warrants this. She has been lost to follow up for extended periods of time as an outpatient and we may need to strike while we can. Will discuss with the patient about possible right and left heart cath on 03/23/2014. She received aspirin 324 mg in the ED. Will go ahead and start her on a heparin drip per pharmacy and stop DVT heparin dosing. Echo pending. See below.   2. Chronic systolic CHF: Echo 23/9532 indicates EF 20-25% with diffuse HK. Regional WMA cannot be excluded. Pro BNP elevated at 2863, no prior on record to compare. She does not appear to be greatly volume overloaded at this time. Do not want to over diurese. Will obtain echo today. Will continue to hold IV fluid. She is on lasix 40 mg bid at home. Currently lasix 20 mg IV q 12 hour push, stop after 4 doses. Creatinine 0.99.    --Case reviewed with Dr. Ellyn Hack since note placed. a)Echo reviewed with Dr. Ellyn Hack. EF seems to be improved up to 45-50% from prior of 20-25% in 2012.   b) Mild to moderate decreased global LV systolic function c) Impaired relaxation pattern of LV diastoic filling d) Moderate concentric LV hypertrophy e) Moderately increased LV septal thickness f) Moderately dilated right atrium g) Mild thickening of the anterior and posterior mitral valve leaflets h) Mild aortic valve stenosis i) PFO vs ASD: no evidence of shunt on bubble study   Plan --Given the above echo Dr. Ellyn Hack feels it is reasonable to continue her cardiac work up at this time with a [employer] 03/23/2014. He will be by to see her and place a note after clinic this afternoon.   3. Hypokalemia: K+ 3.4 upon admission. Being replaced. Monitor.   4. HTN: On acei, bb, hydralazine, imdur, and lasix.   Electronic Signatures for Addendum Section:  Leonie Man (MD) (Signed Addendum 15-Jul-15 17:39)  I have seen & evaluated the patient this PM after Mr. Idolina Primer., PA.  I  reviewed her echocardiogram prior to seeing her -- notable improvement of EF. She presented with post BM syncope.  No SSx to suggest arrythmia.  She has had signficant constipation & had a large BM followed by syncope & near syncope.  She is on a significant amount of BP meds as well.  This is consistent with post-cathartic syncope.  thankfully her EF has notably improved (but has does have diastolic dysfunction with elevated LV filling pressures) -- we can probably reduce her antihypertensive regimen to allow for mild permissive HTN which will make these "vasovagal" type episodes less pronounced. -- would cut back hydralazine to at least TID & consider decreasing CCB dose  The other concerning situation is the prolonged CP spell with mild Troponin elevation -- not sure if this is true ACS or simply diastolic HF related myonecrosis. -- with previously undiagnosed  etiology of cardiomyopathy, she warrants ischemic evaluation; agree with Lexiscan Myoview in AM.    Will f/u tomorrow.   Electronic Signatures: Rise Mu (PA-C)  (Signed 15-Jul-15 13:32)  Authored: General Aspect/Present Illness, History and Physical Exam, Review of System, Family & Social History, Past Medical History, Home Medications, Labs, EKG , Radiology, Allergies, Vital Signs/Nurse's Notes, Impression/Plan Leonie Man (MD)  (Signed 15-Jul-15 17:39)  Co-Signer: General Aspect/Present Illness, Home Medications, Allergies   Last Updated: 15-Jul-15 17:39 by Leonie Man (MD)

## 2014-12-30 NOTE — H&P (Signed)
PATIENT NAME:  [name], [name] MR#:  [id number] DATE OF BIRTH:  11/07/1945  DATE OF ADMISSION:  03/21/2014  REFERRING PHYSICIAN:  Dr. Conni Slipper.  PRIMARY CARE PHYSICIAN:  Dr. Lodema Hong.   CARDIOLOGY:  Dr. Rockey Situ.   CHIEF COMPLAINT:  Presyncope, dizziness, lightheadedness.   HISTORY OF PRESENT ILLNESS:  This is a 69 year old female with known history of hypertension, congestive heart failure, and CVA in the past, that presents with a presyncopal episode, the patient reports she has been feeling weak over the last two days, reports she went to the bathroom, when she stood up she felt dizzy, lightheaded, then reports she did sit back on the commode, where she reports she felt everything turning black and she thinks she did lose consciousness, but she denies any fall, reports she did hold to the rails in the bathroom where she did not fall, she denies any focal deficits, any tingling, any numbness, any stool incontinence, urine incontinence, any fever, any chills.  Reports baseline shortness of breath which remained stable, reports an episode of chest heaviness as well, which resolved without any intervention, upon presentation to ED, the patient was found not to be orthostatic, the patient had basic work-up done in the ED where her labs were only significant for mildly elevated troponin at 0.06, where she was given four baby aspirin, beside that she had no leukocytosis, was afebrile, hospitalist requested to admit the patient for further evaluation.   PAST MEDICAL HISTORY: 1.  History of obstructive sleep apnea, noncompliant with CPAP.  2.  History of hypertension.  3.  Congestive heart failure.  4.  Coronary artery disease.  5.  Morbid obesity.  6.  History of breast cancer, status post lumpectomy, radiation and chemotherapy many years ago.   PAST SURGICAL HISTORY:  1.  Herniorrhaphy.  2.  Lumpectomy.   SOCIAL HISTORY:  The patient lives at home.  No alcohol.  No tobacco.  No illicit  drug use.   FAMILY HISTORY:  Significant for hypertension and diabetes in the family.   ALLERGIES:  No known drug allergies.   HOME MEDICATIONS: 1.  Aspirin 81 mg oral daily.  2.  Metoprolol extended release 50 mg oral daily.  3.  Hydralazine 50 mg 4 times a day.  4.  Imdur 30 mg oral daily.  5.  Lasix 40 mg oral 2 times a day.  6.  Norvasc 10 mg oral daily.  7.  Potassium 20 mEq oral daily.  8.  Vasotec 20 mg oral 2 times a day.   REVIEW OF SYSTEMS: CONSTITUTIONAL:  The patient denies fever, chills.  Reports fatigue, weakness.  EYES:  Denies blurry vision, double vision, inflammation.  EARS, NOSE, THROAT:  Denies tinnitus, ear pain, hearing loss.  RESPIRATORY:  Denies cough, wheezing, hemoptysis.  Reports chronic shortness of breath.  CARDIOVASCULAR:  Reports one episode of chest heaviness resolved without intervention.  Denies any palpitation.  Reports presyncope and dizziness.  GASTROINTESTINAL:  Denies nausea, vomiting, diarrhea, constipation.  GENITOURINARY:  Denies dysuria, hematuria, renal colic.  ENDOCRINE:  Denies polyuria, polydipsia, heat or cold intolerance.  HEMATOLOGY:  Denies anemia, easy bruising, bleeding diathesis. INTEGUMENTARY:  Denies acne, rash or skin lesion.  MUSCULOSKELETAL:  Denies any gout, cramps, arthritis.  NEUROLOGIC:  Denies any focal deficits, tingling, numbness.  Reports episodes of dizziness, presyncope.  PSYCHIATRIC:  Denies any anxiety, insomnia, or depression.   PHYSICAL EXAMINATION: VITAL SIGNS:  Temperature 97.4, pulse 68, respiratory rate 20, pulse ox 98% on 2 liters nasal  cannula.  GENERAL:  Frail, elderly female, looks comfortable in bed, in no apparent distress.  HEENT:  Head atraumatic, normocephalic.  Pupils equal, reactive to light.  Pink conjunctivae.  Anicteric sclerae.  Moist oral mucosa.  NECK:  Supple.  No thyromegaly.  No JVD.  CHEST:  Good air entry bilaterally.  No wheezing, rales, or rhonchi.  Has bibasilar crackles.   CARDIOVASCULAR:  S1, S2 heard.  No rubs, murmur or gallops.  ABDOMEN:  Soft, nontender, nondistended.  Bowel sounds present.  EXTREMITIES:  +1 edema bilaterally.  No clubbing.  No cyanosis.  PSYCHIATRIC:  Appropriate affect.  Awake, alert x 3.  Intact judgment and insight.  NEUROLOGIC:  Cranial nerves grossly intact.  Motor five out of five.  No focal deficits.  SKIN:  Normal skin turgor.  Warm and dry.  MUSCULOSKELETAL:  No joint effusion or erythema.   PERTINENT LABORATORY DATA:  Glucose 136, BUN 14, creatinine 0.96, sodium 140, potassium 3.5, chloride 104, CO2 28.  Troponin 0.06.  White blood cells 6.9, hemoglobin 11.9, hematocrit 37.4, platelets 246.  EKG showing T wave inversion in the lateral leads, EKG showing a normal sinus rhythm at 79 beats per minute with T wave inversion in the lateral leads which appears to be new changes when compared to EKG from [facility] records in 2014.   ASSESSMENT AND PLAN: 1.  Presyncope, generalized weakness, the patient will be admitted for further evaluation.  We will obtain CT head, carotid ultrasound, and 2-D echocardiogram, we will cycle her cardiac enzymes, follow the trend.  We will check urinalysis.  2.  Elevated troponins.  The patient's troponins are borderline.  The patient was given 324 mg of aspirin, she has some new EKG changes with T wave inversion in the lateral leads, the patient currently denies any chest pain, we will continue to cycle her cardiac enzymes and follow the trend, and if there is significant elevation with the second one we will start her on anticoagulation.  We will consult cardiology, Dr. Rockey Situ.  3.  Congestive heart failure, the patient to be in mild congestive heart failure, we will hold on intravenous fluids.  We will continue with Lasix.  4.  Hypokalemia.  We will replace.  5.  Hypertension.  Blood pressure acceptable.  We will continue with home medication.  6.  Deep vein thrombosis prophylaxis.  SubQ heparin.  7.  CODE  STATUS:  FULL CODE.   Total time spent on admission and patient care 55 minutes.    ____________________________ Albertine Patricia, MD dse:ea D: 03/22/2014 00:13:58 ET T: 03/22/2014 00:46:12 ET JOB#: [id number]  cc: Albertine Patricia, MD, <Dictator> Shikita Vaillancourt Graciela Husbands MD ELECTRONICALLY SIGNED 03/22/2014 1:47

## 2014-12-30 NOTE — Discharge Summary (Signed)
PATIENT NAME:  [name], [name] MR#:  [id number] DATE OF BIRTH:  05/08/1946  DATE OF ADMISSION:  03/21/2014 DATE OF DISCHARGE:  03/24/2014  PRESENTING COMPLAINT: Dizziness.   DISCHARGE DIAGNOSES:  1. Nonischemic cardiomyopathy, ejection fraction around 35% to 40%.  2. Chronic systolic congestive heart failure.  3. Hypertension.  4. Bradycardia, transient, intermittent.  5. Morbid obesity.   CODE STATUS: Full code.   MEDICATIONS:  1. Aspirin 81 mg daily.  2. Vasotec 20 mg p.o. b.i.d.  3. Lasix 40 mg p.o. b.i.d.  4. K-Dur 20 mEq p.o. daily.  5. Hydralazine 50 mg t.i.d.  6. Carvedilol 3.125 b.i.d.  7. Imdur 30 mg extended release p.o. daily.  8. Tylenol 325 mg 2 tablets every 4 hours as needed.   The patient advised to stop taking metoprolol, Norvasc.   Home health, PT, RN.  FOLLOWUP:  1. Follow up with Dr. Fletcher Anon in 1 to 2 weeks.  2. Follow up with Dr. Denton Lank at [facility] in 1 to 2 weeks.  3. Event monitor to be arranged by Dr. Tyrell Antonio office.   CARDIOLOGY CONSULTATION: Dr. Rockey Situ, Dr. Fletcher Anon.   LABORATORY DATA: At discharge.  PROCEDURES: Myoview stress test, which showed no wall motion abnormality. EF around 34%, likely due to nonischemic cardiomyopathy. Hemoglobin is 11.8. UA negative for UTI. Ultrasound carotid Doppler essentially very minimal amount of bilateral atherosclerotic plaque right more than left.   CT of the head negative for acute stroke.   White count is 6.1, creatinine is 0.99. Troponin was 0.06, 0.09, 0.06.   BRIEF SUMMARY OF HOSPITAL COURSE: [name] is an obese 69 year old African American female with history of hypertension and chronic congestive heart failure comes in with:  1. Presyncope/generalized weakness. She was admitted, placed on the telemetry monitor, which showed intermittent bradycardia mainly during the nighttime. She stayed otherwise hemodynamically stable and remained asymptomatic from her bradycardia. Her ultrasound was  negative for so she was continued on her aspirin. Her metoprolol was changed to p.o. Coreg. CT head remained negative. She tolerated physical therapy. Recommended home PT which has been arranged.  2. Elevated troponin with syncope. Cardiology recommended IV heparin drip, which was discontinued once the patient remained stable. Myoview was essentially negative for any wall motion abnormality. Ejection fraction was around 35%, appears nonischemic cardiomyopathy. The patient did not appear to have congestive heart failure. Event monitor will be set up from Dr. Tyrell Antonio office.  3. Congestive heart failure, chronic, systolic. Continued Lasix. Not presently in heart failure. Sats were more than 92% on room air.  4. Asymptomatic bradycardia mainly during the nighttime. Event monitor to be set up by Dr. Tyrell Antonio office.  5. Hypertension. Continued her home medication, adjustment in dosage were made.   CODE STATUS: Full code.   The above was discussed with the patient and patient's daughter.   TIME SPENT: 40 minutes.   ____________________________ Hart Rochester Posey Pronto, MD sap:lt D: 03/25/2014 07:06:40 ET T: 03/25/2014 07:46:32 ET JOB#: [id number]  cc: Mael Delap A. Posey Pronto, MD, <Dictator> Minna Merritts, MD Mertie Clause Fletcher Anon, MD Sarah "Baltazar Apo, MD  Ilda Basset MD ELECTRONICALLY SIGNED 04/04/2014 15:42

## 2015-05-21 ENCOUNTER — Other Ambulatory Visit: Payer: Self-pay | Admitting: Family Medicine

## 2015-05-21 DIAGNOSIS — C50912 Malignant neoplasm of unspecified site of left female breast: Secondary | ICD-10-CM

## 2015-05-21 DIAGNOSIS — Z0000 Encounter for general adult medical examination without abnormal findings: Secondary | ICD-10-CM

## 2015-05-21 DIAGNOSIS — Z1231 Encounter for screening mammogram for malignant neoplasm of breast: Secondary | ICD-10-CM

## 2015-05-31 ENCOUNTER — Ambulatory Visit: Payer: Medicare PPO | Attending: Family Medicine

## 2016-02-11 ENCOUNTER — Ambulatory Visit
Admission: RE | Admit: 2016-02-11 | Discharge: 2016-02-11 | Disposition: A | Discharge status: Home / Self Care | Payer: Medicare PPO | Source: Ambulatory Visit | Attending: Family Medicine | Admitting: Family Medicine

## 2016-02-11 ENCOUNTER — Other Ambulatory Visit: Payer: Self-pay | Admitting: Family Medicine

## 2016-02-11 DIAGNOSIS — Z1231 Encounter for screening mammogram for malignant neoplasm of breast: Secondary | ICD-10-CM

## 2016-02-14 ENCOUNTER — Other Ambulatory Visit: Payer: Self-pay | Admitting: Family Medicine

## 2016-02-14 DIAGNOSIS — R928 Other abnormal and inconclusive findings on diagnostic imaging of breast: Secondary | ICD-10-CM

## 2016-02-15 ENCOUNTER — Ambulatory Visit
Admission: RE | Admit: 2016-02-15 | Discharge: 2016-02-15 | Disposition: A | Discharge status: Home / Self Care | Payer: Medicare PPO | Source: Ambulatory Visit | Attending: Family Medicine | Admitting: Family Medicine

## 2016-02-15 DIAGNOSIS — N63 Unspecified lump in breast: Secondary | ICD-10-CM | POA: Insufficient documentation

## 2016-02-15 DIAGNOSIS — R928 Other abnormal and inconclusive findings on diagnostic imaging of breast: Secondary | ICD-10-CM

## 2016-02-18 ENCOUNTER — Telehealth: Payer: Self-pay | Admitting: Cardiovascular Disease

## 2016-02-18 NOTE — Telephone Encounter (Signed)
S/w pt who confirms she has not seen another cardiologist since Dr. Rockey Situ Aug 2014. She is agreeable to OV for cardiac clearance. Pt has OV August 4, 2:20pm

## 2016-02-18 NOTE — Telephone Encounter (Signed)
Received request for clearance from [facility] for 04/21/16 colonoscopy/EGD w/Dr. Vira Agar. Pt last since in office 2014. Forwarded to scheduling for pt appt. Clearance form placed on Mandi's desk.

## 2016-02-19 ENCOUNTER — Telehealth: Payer: Self-pay | Admitting: Cardiovascular Disease

## 2016-02-19 ENCOUNTER — Other Ambulatory Visit: Payer: Self-pay | Admitting: Gastroenterology

## 2016-02-19 DIAGNOSIS — R1310 Dysphagia, unspecified: Secondary | ICD-10-CM

## 2016-02-19 NOTE — Telephone Encounter (Signed)
Notified Luria at [facility] by fax, [phone], of pt August appt w/Dr. Rockey Situ

## 2016-03-12 ENCOUNTER — Ambulatory Visit
Admission: RE | Admit: 2016-03-12 | Discharge: 2016-03-12 | Disposition: A | Discharge status: Home / Self Care | Payer: Medicare PPO | Source: Ambulatory Visit | Attending: Gastroenterology | Admitting: Gastroenterology

## 2016-03-12 DIAGNOSIS — K228 Other specified diseases of esophagus: Secondary | ICD-10-CM | POA: Diagnosis not present

## 2016-03-12 DIAGNOSIS — K449 Diaphragmatic hernia without obstruction or gangrene: Secondary | ICD-10-CM | POA: Diagnosis not present

## 2016-03-12 DIAGNOSIS — R1310 Dysphagia, unspecified: Secondary | ICD-10-CM | POA: Diagnosis present

## 2016-04-11 ENCOUNTER — Encounter: Payer: Self-pay | Admitting: *Deleted

## 2016-04-11 ENCOUNTER — Ambulatory Visit: Payer: Medicare PPO | Admitting: Cardiovascular Disease

## 2016-04-21 ENCOUNTER — Ambulatory Visit
Admission: RE | Admit: 2016-04-21 | Payer: Medicare PPO | Source: Ambulatory Visit | Admitting: Unknown Physician Specialty

## 2016-04-21 ENCOUNTER — Encounter: Admission: RE | Payer: Self-pay | Source: Ambulatory Visit

## 2016-04-21 SURGERY — COLONOSCOPY WITH PROPOFOL
Anesthesia: General

## 2016-05-16 ENCOUNTER — Ambulatory Visit: Payer: Medicare PPO | Admitting: Internal Medicine

## 2016-05-28 ENCOUNTER — Ambulatory Visit: Payer: Medicare PPO | Admitting: Internal Medicine

## 2016-06-27 ENCOUNTER — Encounter: Payer: Self-pay | Admitting: Cardiovascular Disease

## 2016-06-27 ENCOUNTER — Ambulatory Visit (INDEPENDENT_AMBULATORY_CARE_PROVIDER_SITE_OTHER): Payer: Medicare PPO | Admitting: Cardiovascular Disease

## 2016-06-27 VITALS — BP 186/92 | HR 56 | Ht 70.0 in | Wt 261.8 lb

## 2016-06-27 DIAGNOSIS — R600 Localized edema: Secondary | ICD-10-CM

## 2016-06-27 DIAGNOSIS — I5022 Chronic systolic (congestive) heart failure: Secondary | ICD-10-CM | POA: Diagnosis not present

## 2016-06-27 DIAGNOSIS — I509 Heart failure, unspecified: Secondary | ICD-10-CM

## 2016-06-27 DIAGNOSIS — R0602 Shortness of breath: Secondary | ICD-10-CM

## 2016-06-27 DIAGNOSIS — R32 Unspecified urinary incontinence: Secondary | ICD-10-CM | POA: Insufficient documentation

## 2016-06-27 DIAGNOSIS — I10 Essential (primary) hypertension: Secondary | ICD-10-CM

## 2016-06-27 MED ORDER — CLONIDINE HCL 0.2 MG PO TABS: 0.2000 mg | ORAL_TABLET | Freq: Two times a day (BID) | ORAL | 11 refills | Status: DC

## 2016-06-27 MED ORDER — HYDRALAZINE HCL 50 MG PO TABS: 50.0000 mg | ORAL_TABLET | Freq: Three times a day (TID) | ORAL | 11 refills | Status: DC

## 2016-06-27 MED ORDER — POTASSIUM CHLORIDE CRYS ER 20 MEQ PO TBCR: 20.0000 meq | EXTENDED_RELEASE_TABLET | Freq: Every day | ORAL | 6 refills | Status: DC

## 2016-06-27 NOTE — Patient Instructions (Addendum)
Medication Instructions:   Please restart hydralazine 50 mg three times a day Please restart clonidine one pill twice a day Stay on enalapril Stay on imdur/isosorbide one pill twice a day  Labwork:  No new labs needed  Testing/Procedures:  No further testing at this time   Follow-Up: It was a pleasure seeing you in the office today. Please call us if you have new issues that need to be addressed before your next appt.  [phone]  Your physician wants you to follow-up in:  3 to 4 week.    If you need a refill on your cardiac medications before your next appointment, please call your pharmacy.

## 2016-06-27 NOTE — Progress Notes (Signed)
Cardiology Office Note  Date:  06/27/2016   ID:  Billyjo Mccuskey, DOB [birth year], MRN [id number]  PCP:  Baltazar Apo, MD   Chief Complaint  Patient presents with  . other    C/o chest pain, sob and stomach problems. Pt needs cardiac clearance colonoscopy. Meds reviewed verbally with pt.    HPI:  Ms. Zogg is a very pleasant 70--year-old woman with morbid obesity, severe lower extremity edema, systolic CHF, sleep apnea, pulmonary disease, moderately depressed ejection fraction with global hypokinesis, severe hypertension,  patient of Dr.  Posey Pronto who presents For consultation for preoperative evaluation prior to EGD and colonoscopy and for central hypertension  Patient was last seen by myself 04/20/2013, prior to that seen November 2012 She presents as blood pressure is elevated, needs clearance for EGD and colonoscopy She brings her medications with her today Some Med confusion/noncompliance  Off hydralazine? 100 mg headache, sometimes takes 50 mg enalapril lower dose at 10 mg, was previously on 20 ng daily Off clonidine, ran out of script? Reports having no refill .Was previously on 0.1 mg twice a day Out of her potassium, reports she has no refill  Currently taking enalapril 10 mg daily, in her back today imdur 60 BID, in her back today Lasix 40 mg twice a day  Chronic leg swelling, tender to palpation Walking less, legs are getting weaker, gait instability Drove herself to the office today  CPAP broken, was supposed to get new machine to primary care, has not received this yet Not sleeping well without her machine  No recent lab work available She denies any cough, lightheadedness, no chest pain.   EKG shows normal sinus rhythm with rate 56 beats per minute, nonspecific ST changes in  one and aVL  Other past medical history reviewed On previous office visit in 2014, she reported having occasional skin breakdown,  weeping sores, worse after spending time on her feet she  denies any significant shortness of breath.    PMH:   has a past medical history of Abnormality of gait; Breast cancer ([place]); CHF (congestive heart failure) ([place]); Edema; GERD (gastroesophageal reflux disease); Hypertension; Hypokinesis; Malignant neoplasm of breast (female), unspecified site; Neuropathy ([place]); Obesity; Pain in joint, site unspecified; Sleep apnea; Tinea pedis; and Unspecified hearing loss.  PSH:    Past Surgical History:  Procedure Laterality Date  . BREAST LUMPECTOMY     left breast   . CATARACT EXTRACTION     left eye  . HERNIA REPAIR    . TUBAL LIGATION      Current Outpatient Prescriptions  Medication Sig Dispense Refill  . aspirin EC 81 MG tablet Take 81 mg by mouth daily.      . citalopram (CELEXA) 20 MG tablet Take 20 mg by mouth daily.    . enalapril (VASOTEC) 10 MG tablet Take 10 mg by mouth daily.    . furosemide (LASIX) 40 MG tablet Take 2 tablets (80 mg total) by mouth 2 (two) times daily. 240 tablet 6  . isosorbide mononitrate (IMDUR) 60 MG 24 hr tablet Take 1 tablet (60 mg total) by mouth 2 (two) times daily. 180 tablet 3  . omeprazole (PRILOSEC) 20 MG capsule Take 20 mg by mouth daily.    Marland Kitchen oxybutynin (DITROPAN) 5 MG tablet Take 5 mg by mouth 2 (two) times daily.    . potassium chloride SA (K-DUR,KLOR-CON) 20 MEQ tablet Take 1 tablet (20 mEq total) by mouth daily. 30 tablet 6  . sertraline (ZOLOFT) 50 MG tablet  Take 50 mg by mouth daily.    . cloNIDine (CATAPRES) 0.2 MG tablet Take 1 tablet (0.2 mg total) by mouth 2 (two) times daily. 60 tablet 11  . hydrALAZINE (APRESOLINE) 50 MG tablet Take 1 tablet (50 mg total) by mouth 3 (three) times daily. 90 tablet 11   No current facility-administered medications for this visit.      Allergies:   Review of patient's allergies indicates no known allergies.   Social History:  The patient  reports that she quit smoking about 15 years ago. Her smoking use included Cigarettes. She has a 30.00 pack-year smoking  history. She has never used smokeless tobacco. She reports that she does not drink alcohol or use drugs.   Family History:   family history includes Breast cancer in her sister; Hypertension in her brother.    Review of Systems: Review of Systems  Constitutional: Negative.   Respiratory: Positive for shortness of breath.   Cardiovascular: Positive for leg swelling.  Gastrointestinal: Negative.   Musculoskeletal: Negative.        Gait instability  Neurological: Negative.   Psychiatric/Behavioral: Negative.   All other systems reviewed and are negative.    PHYSICAL EXAM: VS:  BP (!) 186/92 (BP Location: Left Arm, Patient Position: Sitting, Cuff Size: Large)   Pulse (!) 56   Ht 5\' 10"  (1.778 m)   Wt 261 lb 12 oz (118.7 kg)   BMI 37.56 kg/m  , BMI Body mass index is 37.56 kg/m. GEN: Well nourished, well developed, in no acute distress, obese, presents in a wheelchair  HEENT: normal  Neck: no JVD, carotid bruits, or masses Cardiac: RRR; no murmurs, rubs, or gallops, trace edema bilaterally below the knees, tender to palpation Respiratory:  clear to auscultation bilaterally, expiratory wheezes, normal work of breathing GI: soft, nontender, nondistended, + BS MS: no deformity or atrophy  Skin: warm and dry, no rash Neuro:  Strength and sensation are intact Psych: euthymic mood, full affect    Recent Labs: No results found for requested labs within last 8760 hours.    Lipid Panel No results found for: CHOL, HDL, LDLCALC, TRIG    Wt Readings from Last 3 Encounters:  06/27/16 261 lb 12 oz (118.7 kg)  04/20/13 265 lb 8 oz (120.4 kg)  07/15/11 (!) 313 lb (142 kg)       ASSESSMENT AND PLAN:  Congestive heart failure, unspecified congestive heart failure chronicity, unspecified congestive heart failure type ([place]) - Plan: EKG 12-Lead  Localized edema Trace tender edema, likely secondary to venous insufficiency We'll avoid calcium channel blockers  Chronic systolic CHF  (congestive heart failure) ([place]) Reports her breathing is relatively stable, she has Been compliant with her Lasix 40 twice a day despite incontinence  Essential hypertension, malignant Poorly controlled, as seen on her last 2 visits Lots of medication confusion, missing several of her medications We have requested notes from primary care though they are closed today We will restart her clonidine but at 0.2 mg twice a day She'll need to be monitored closely for worsening bradycardia with this medication Also recommended she restart hydralazine 50 mg 3 times a day. She reports that higher doses give her a headache Encouraged her to continue Imdur 60 twice a day, enalapril 10 mg daily We will look into primary care notes to see if she can tolerate higher dose enalapril. We'll need to see lab work She does not have blood pressure cuff at home  Shortness of breath Reports symptoms are  stable, Would work on blood pressure management  Incontinence Feels she has a bladder problem Recommended she discuss this with primary care, may need referral to urology. Reports she was previously seen by urology [facility]  Preop colonoscopy Not ready at this time for colonoscopy given poorly controlled hypertension We'll see her back in close follow-up   Disposition:   F/U  1 month   Orders Placed This Encounter  Procedures  . EKG 12-Lead     Signed, Esmond Plants, M.D., Ph.D. 06/27/2016  [place], [place]

## 2016-07-06 ENCOUNTER — Emergency Department
Admission: EM | Admit: 2016-07-06 | Discharge: 2016-07-06 | Disposition: A | Discharge status: Home / Self Care | Payer: Medicare PPO | Attending: Emergency Medicine | Admitting: Emergency Medicine

## 2016-07-06 ENCOUNTER — Emergency Department: Payer: Medicare PPO

## 2016-07-06 ENCOUNTER — Encounter: Payer: Self-pay | Admitting: Emergency Medicine

## 2016-07-06 DIAGNOSIS — G8929 Other chronic pain: Secondary | ICD-10-CM | POA: Insufficient documentation

## 2016-07-06 DIAGNOSIS — Z853 Personal history of malignant neoplasm of breast: Secondary | ICD-10-CM | POA: Insufficient documentation

## 2016-07-06 DIAGNOSIS — Z79899 Other long term (current) drug therapy: Secondary | ICD-10-CM | POA: Insufficient documentation

## 2016-07-06 DIAGNOSIS — Z87891 Personal history of nicotine dependence: Secondary | ICD-10-CM | POA: Diagnosis not present

## 2016-07-06 DIAGNOSIS — R51 Headache: Secondary | ICD-10-CM | POA: Diagnosis present

## 2016-07-06 DIAGNOSIS — Z7982 Long term (current) use of aspirin: Secondary | ICD-10-CM | POA: Insufficient documentation

## 2016-07-06 DIAGNOSIS — R1084 Generalized abdominal pain: Secondary | ICD-10-CM | POA: Insufficient documentation

## 2016-07-06 DIAGNOSIS — R519 Headache, unspecified: Secondary | ICD-10-CM

## 2016-07-06 DIAGNOSIS — I110 Hypertensive heart disease with heart failure: Secondary | ICD-10-CM | POA: Diagnosis not present

## 2016-07-06 DIAGNOSIS — I5022 Chronic systolic (congestive) heart failure: Secondary | ICD-10-CM | POA: Insufficient documentation

## 2016-07-06 NOTE — ED Provider Notes (Signed)
[facility] Emergency Department Provider Note  ____________________________________________  Time seen: Approximately 4:30 PM  I have reviewed the triage vital signs and the nursing notes.   HISTORY  Chief Complaint Headache (x 3 months) and Abdominal Pain (x 3 months)    HPI [name] is a 70 y.o. female who complains of chronic intermittent abdominal pain and headache for at least the past 3 months. The patient states it's more like 4 or 5 months. She is awaiting a colonoscopy but has to get her blood pressure control with her cardiologist first. She started taking clonidine 2 times a day which seems to be helping. She did not take it this morning yet. Denies any new symptoms. No chest pain or shortness of breath. Headache is right-sided, right facial. Not worse with chewing. No vision changes. No neck pain or stiffness or fevers or chills. No numbness tingling or weakness. No trauma.     Past Medical History:  Diagnosis Date  . Abnormality of gait   . Breast cancer ([place])    LT LUMPECTOMY  . CHF (congestive heart failure) ([place])    1) echo 07/2011: EF 20-25%, mild concentric hypertrophy, diffuse HK, regional WMA cannot be excluded, grade 1 DD, mitral valve with mild regurg, LA mildly dilated). 2) EF 40-45%, mild AS,                    . Edema   . GERD (gastroesophageal reflux disease)   . Hypertension   . Hypokinesis    global, EF 35-45 % from Echo.  . Malignant neoplasm of breast (female), unspecified site    left  . Neuropathy ([place])   . Obesity   . Pain in joint, site unspecified   . Sleep apnea   . Tinea pedis   . Unspecified hearing loss    bilateral     Patient Active Problem List   Diagnosis Date Noted  . Urinary incontinence 06/27/2016  . Chronic systolic CHF (congestive heart failure) ([place]) 04/20/2013  . HTN (hypertension) 07/16/2011  . CHF (congestive heart failure) ([place]) 07/15/2011  . Shortness of breath 07/15/2011  .  Edema 07/15/2011     Past Surgical History:  Procedure Laterality Date  . BREAST LUMPECTOMY     left breast   . CATARACT EXTRACTION     left eye  . HERNIA REPAIR    . TUBAL LIGATION       Prior to Admission medications   Medication Sig Start Date End Date Taking? Authorizing Provider  aspirin EC 81 MG tablet Take 81 mg by mouth daily.      Historical Provider, MD  citalopram (CELEXA) 20 MG tablet Take 20 mg by mouth daily.    Historical Provider, MD  cloNIDine (CATAPRES) 0.2 MG tablet Take 1 tablet (0.2 mg total) by mouth 2 (two) times daily. 06/27/16   Minna Merritts, MD  enalapril (VASOTEC) 10 MG tablet Take 10 mg by mouth daily.    Historical Provider, MD  furosemide (LASIX) 40 MG tablet Take 2 tablets (80 mg total) by mouth 2 (two) times daily. 04/20/13   Minna Merritts, MD  hydrALAZINE (APRESOLINE) 50 MG tablet Take 1 tablet (50 mg total) by mouth 3 (three) times daily. 06/27/16 06/27/17  Minna Merritts, MD  isosorbide mononitrate (IMDUR) 60 MG 24 hr tablet Take 1 tablet (60 mg total) by mouth 2 (two) times daily. 04/20/13 06/27/16  Minna Merritts, MD  omeprazole (PRILOSEC) 20 MG capsule Take  20 mg by mouth daily.    Historical Provider, MD  oxybutynin (DITROPAN) 5 MG tablet Take 5 mg by mouth 2 (two) times daily.    Historical Provider, MD  potassium chloride SA (K-DUR,KLOR-CON) 20 MEQ tablet Take 1 tablet (20 mEq total) by mouth daily. 06/27/16   Minna Merritts, MD  sertraline (ZOLOFT) 50 MG tablet Take 50 mg by mouth daily.    Historical Provider, MD     Allergies Review of patient's allergies indicates no known allergies.   Family History  Problem Relation Age of Onset  . Hypertension Brother   . Breast cancer Sister     Social History Social History  Substance Use Topics  . Smoking status: Former Smoker    Packs/day: 1.00    Years: 30.00    Types: Cigarettes    Quit date: 07/14/2000  . Smokeless tobacco: Never Used  . Alcohol use No    Review of  Systems  Constitutional:   No fever or chills.  ENT:   No sore throat. No rhinorrhea. Cardiovascular:   No chest pain. Respiratory:   No dyspnea or cough. Gastrointestinal:   Positive for abdominal pain, without vomiting and diarrhea.  Genitourinary:   Negative for dysuria or difficulty urinating. Positive urinary incontinence with a history of bladder suspension Musculoskeletal:   Negative for focal pain or swelling Neurological:   Positive for headaches 10-point ROS otherwise negative.  ____________________________________________   PHYSICAL EXAM:  VITAL SIGNS: ED Triage Vitals  Enc Vitals Group     BP 07/06/16 1402 (!) 153/74     Pulse Rate 07/06/16 1402 73     Resp 07/06/16 1402 18     Temp 07/06/16 1402 98.2 F (36.8 C)     Temp Source 07/06/16 1402 Oral     SpO2 07/06/16 1402 96 %     Weight 07/06/16 1402 261 lb (118.4 kg)     Height 07/06/16 1402 5\' 10"  (1.778 m)     Head Circumference --      Peak Flow --      Pain Score 07/06/16 1416 8     Pain Loc --      Pain Edu? --      Excl. in [place]? --     Vital signs reviewed, nursing assessments reviewed.   Constitutional:   Alert and oriented. Well appearing and in no distress. Eyes:   No scleral icterus. No conjunctival pallor. PERRL. EOMI.  No nystagmus. ENT   Head:   Normocephalic and atraumatic.Temporal arteries nontender. Symmetric pulsations. No asymmetric findings of skin changes or lacrimation.   Nose:   No congestion/rhinnorhea. No septal hematoma   Mouth/Throat:   MMM, no pharyngeal erythema. No peritonsillar mass.    Neck:   No stridor. No SubQ emphysema. No meningismus. Hematological/Lymphatic/Immunilogical:   No cervical lymphadenopathy. Cardiovascular:   RRR. Symmetric bilateral radial and DP pulses.  No murmurs.  Respiratory:   Normal respiratory effort without tachypnea nor retractions. Breath sounds are clear and equal bilaterally. No wheezes/rales/rhonchi. Gastrointestinal:   SoftObese  abdomen with generalized tenderness. There are scars from old surgeries.. Non distended. There is no CVA tenderness.  No rebound, rigidity, or guarding. Genitourinary:   deferred Musculoskeletal:   Nontender with normal range of motion in all extremities. No joint effusions.  No lower extremity tenderness.  No edema. Neurologic:   Normal speech and language.  CN 2-10 normal. Motor grossly intact. No gross focal neurologic deficits are appreciated.  Skin:    Skin  is warm, dry and intact. No rash noted.  No petechiae, purpura, or bullae.  ____________________________________________    LABS (pertinent positives/negatives) (all labs ordered are listed, but only abnormal results are displayed) Labs Reviewed - No data to display ____________________________________________   EKG    ____________________________________________    RADIOLOGY  CT head unremarkable X-ray acute abdomen series unremarkable  ____________________________________________   PROCEDURES Procedures  ____________________________________________   INITIAL IMPRESSION / ASSESSMENT AND PLAN / ED COURSE  Pertinent labs & imaging results that were available during my care of the patient were reviewed by me and considered in my medical decision making (see chart for details).  Patient presents with chronic headache and abdominal pain. She is undergoing outpatient workup for these with her cardiologist and primary care and eventually gastroenterology. They do not appear to be any acute worrisome pathology at this time.Considering the patient's symptoms, medical history, and physical examination today, I have low suspicion for ischemic stroke, intracranial hemorrhage, meningitis, encephalitis, carotid or vertebral dissection, venous sinus thrombosis, MS, intracranial hypertension, glaucoma, CRAO, CRVO, or temporal arteritis. I have low suspicion for cholecystitis or biliary pathology, pancreatitis, perforation or  bowel obstruction, hernia, intra-abdominal abscess, AAA or dissection, volvulus or intussusception, mesenteric ischemia, or appendicitis.  However, because of the chronicity of the symptoms and the association with elevated blood pressure, I will get a x-ray of the abdomen to evaluate for obstruction and a CT of the head to evaluate for structural lesion. If these are unremarkable, think the patient is suitable for outpatient follow-up with her doctors and I did encourage her to continue taking her blood pressure medications for long-term management of hypertension and facilitate her evaluation of other issues. She is agreeable with this plan.     Clinical Course   ____________________________________________   FINAL CLINICAL IMPRESSION(S) / ED DIAGNOSES  Final diagnoses:  None  Chronic generalized abdominal pain Chronic right-sided headache     Portions of this note were generated with dragon dictation software. Dictation errors may occur despite best attempts at proofreading.    Carrie Mew, MD 07/06/16 [phone]

## 2016-07-06 NOTE — ED Notes (Signed)
Triage completed by this nurse - placed under medic's sign in.

## 2016-07-06 NOTE — ED Triage Notes (Addendum)
Was suppose to have a colonoscopy for this pain x 3 months. appt was in august - was cancelled d/t her high bp and that she needed to see her cardiologist. Clarnce Flock the cardiologist and her bp meds restarted 06/27/16. States knows she needs to reschedule the colonoscopy but wants to be seen for the headaches x 3 months. Was told by cardiologist that it is d/t her htn. Did not take tylenol or motrin

## 2016-07-06 NOTE — ED Notes (Signed)
Pt reports not taking b/p medicine this AM. When asked why she stated "I wanted to sit down and not have to pee because it makes me pee a lot" this RN educated patient on taking medicine twice a day and not skipping medicine

## 2016-07-06 NOTE — Discharge Instructions (Signed)
Your CT scan of the head and xrays of the abdomen were unremarkable today.  Continue taking all your medications and follow up with your doctor this week.

## 2016-07-22 ENCOUNTER — Ambulatory Visit (INDEPENDENT_AMBULATORY_CARE_PROVIDER_SITE_OTHER): Payer: Medicare PPO | Admitting: Cardiovascular Disease

## 2016-07-22 ENCOUNTER — Encounter: Payer: Self-pay | Admitting: Cardiovascular Disease

## 2016-07-22 VITALS — BP 160/88 | HR 67 | Ht 70.0 in | Wt 259.0 lb

## 2016-07-22 DIAGNOSIS — I159 Secondary hypertension, unspecified: Secondary | ICD-10-CM

## 2016-07-22 DIAGNOSIS — Z23 Encounter for immunization: Secondary | ICD-10-CM

## 2016-07-22 DIAGNOSIS — R0602 Shortness of breath: Secondary | ICD-10-CM

## 2016-07-22 DIAGNOSIS — I509 Heart failure, unspecified: Secondary | ICD-10-CM | POA: Diagnosis not present

## 2016-07-22 NOTE — Patient Instructions (Addendum)
Medication Instructions:   No medication changes made  We will  Schedule a visiting nurse for medication management, CHF, HTN control, medication adjustment to help Dr. Rockey Situ   Number for Dr. Marry Guan (orthopedics):  Labwork:  No new labs needed  Testing/Procedures:  No further testing at this time   I recommend watching educational videos on topics of interest to you at:       [email or web address].com  Enter code: [email or web address]    Follow-Up: It was a pleasure seeing you in the office today. Please call us if you have new issues that need to be addressed before your next appt.  [phone]  Your physician wants you to follow-up in: 6 months.  You will receive a reminder letter in the mail two months in advance. If you don't receive a letter, please call our office to schedule the follow-up appointment.  If you need a refill on your cardiac medications before your next appointment, please call your pharmacy.

## 2016-07-22 NOTE — Progress Notes (Signed)
Cardiology Office Note  Date:  07/22/2016   ID:  [name], DOB 09-May-1946, MRN [id number]  PCP:  [name] Apo, MD   Chief Complaint  Patient presents with  . other    3-4 week follow up. Pt. c/o shortness of breath and chest pain. Meds reviewed by the pt. verbally.     HPI:  [name] is a very pleasant 70--year-old woman with morbid obesity, severe lower extremity edema, systolic CHF, sleep apnea, pulmonary disease, moderately depressed ejection fraction with global hypokinesis, severe hypertension,  patient of Dr.  Posey [name] who presents For Follow-up of her hypertension  In follow-up today, she presents in a wheelchair She did not bring her medications with her today Reports that she feels okay, no new complaints apart from her chronic knee pain  In terms of her blood pressure medication regiment, She Takes clonidine once a day. Previously recommended to take this twice a day Takes isosorbide once a day. Previously recommended to take this twice a day Still with GERD in the evening, takes omeprazole 20 mg in the morning  Severe knee pain, has had cortisone Has not seen orthopedics in years Son in town, Daughter with health issues Reports that she does a pillbox but misses her medications frequently for example unclear if she took her blood pressure pills this morning  EKG on today's visit shows normal sinus rhythm with rate 67 bpm, T-wave abnormality in 1 and aVL  Other past medical history reviewed On last clinic visit was medication confusion  Chronic leg swelling, tender to palpation Walking less, legs are getting weaker, gait instability  CPAP broken, was supposed to get new machine to primary care, has not received this yet Not sleeping well without her machine  On previous office visit in 2014, she reported having occasional skin breakdown,  weeping sores, worse after spending time on her feet she denies any significant shortness of breath.    PMH:   has a  past medical history of Abnormality of gait; Breast cancer ([place]); CHF (congestive heart failure) ([place]); Edema; GERD (gastroesophageal reflux disease); Hypertension; Hypokinesis; Malignant neoplasm of breast (female), unspecified site; Neuropathy ([place]); Obesity; Pain in joint, site unspecified; Sleep apnea; Tinea pedis; and Unspecified hearing loss.  PSH:    Past Surgical History:  Procedure Laterality Date  . BREAST LUMPECTOMY     left breast   . CATARACT EXTRACTION     left eye  . HERNIA REPAIR    . TUBAL LIGATION      Current Outpatient Prescriptions  Medication Sig Dispense Refill  . aspirin EC 81 MG tablet Take 81 mg by mouth daily.      . citalopram (CELEXA) 20 MG tablet Take 20 mg by mouth daily.    . cloNIDine (CATAPRES) 0.2 MG tablet Take 1 tablet (0.2 mg total) by mouth 2 (two) times daily. 60 tablet 11  . enalapril (VASOTEC) 10 MG tablet Take 10 mg by mouth daily.    . furosemide (LASIX) 40 MG tablet Take 2 tablets (80 mg total) by mouth 2 (two) times daily. 240 tablet 6  . hydrALAZINE (APRESOLINE) 50 MG tablet Take 1 tablet (50 mg total) by mouth 3 (three) times daily. 90 tablet 11  . isosorbide mononitrate (IMDUR) 60 MG 24 hr tablet Take 1 tablet (60 mg total) by mouth 2 (two) times daily. 180 tablet 3  . omeprazole (PRILOSEC) 20 MG capsule Take 20 mg by mouth daily.    Marland Kitchen oxybutynin (DITROPAN) 5 MG tablet Take 5  mg by mouth 2 (two) times daily.    . potassium chloride SA (K-DUR,KLOR-CON) 20 MEQ tablet Take 1 tablet (20 mEq total) by mouth daily. 30 tablet 6  . sertraline (ZOLOFT) 50 MG tablet Take 50 mg by mouth daily.     No current facility-administered medications for this visit.      Allergies:   Patient has no known allergies.   Social History:  The patient  reports that she quit smoking about 16 years ago. Her smoking use included Cigarettes. She has a 30.00 pack-year smoking history. She has never used smokeless tobacco. She reports that she does not drink alcohol  or use drugs.   Family History:   family history includes Breast cancer in her sister; Hypertension in her brother.    Review of Systems: Review of Systems  Constitutional: Negative.   Respiratory: Positive for shortness of breath.   Cardiovascular: Positive for leg swelling.  Gastrointestinal: Negative.   Musculoskeletal: Negative.        Gait instability  Neurological: Negative.   Psychiatric/Behavioral: Negative.   All other systems reviewed and are negative.    PHYSICAL EXAM: VS:  BP (!) 160/88 (BP Location: Right Arm, Patient Position: Sitting, Cuff Size: Large)   Pulse 67   Ht 5\' 10"  (1.778 m)   Wt 259 lb (117.5 kg)   BMI 37.16 kg/m  , BMI Body mass index is 37.16 kg/m. GEN: Well nourished, well developed, in no acute distress, obese, presents in a wheelchair  HEENT: normal  Neck: no JVD, carotid bruits, or masses Cardiac: RRR; no murmurs, rubs, or gallops, trace edema bilaterally below the knees, tender to palpation Respiratory:  clear to auscultation bilaterally, expiratory wheezes, normal work of breathing GI: soft, nontender, nondistended, + BS MS: no deformity or atrophy  Skin: warm and dry, no rash Neuro:  Strength and sensation are intact Psych: euthymic mood, full affect    Recent Labs: No results found for requested labs within last 8760 hours.    Lipid Panel No results found for: CHOL, HDL, LDLCALC, TRIG    Wt Readings from Last 3 Encounters:  07/22/16 259 lb (117.5 kg)  07/06/16 261 lb (118.4 kg)  06/27/16 261 lb 12 oz (118.7 kg)       ASSESSMENT AND PLAN:  Congestive heart failure, unspecified congestive heart failure chronicity, unspecified congestive heart failure type ([place]) - Plan: EKG 12-Lead Appears euvolemic on today's visit Currently taking Lasix twice a day with potassium  secondary to venous insufficiency, Will avoid calcium channel blockers  Chronic systolic CHF (congestive heart failure) ([place]) Reports her breathing is  relatively stable,  No changes to her regimen  Essential hypertension, malignant She needs help at home, With her pillbox and medications. Significant medication noncompliance/confusion. Did not take medications today, hard to adjust her medications on her visits.  She is taking pills once a day when they are prescribed twice a day Visiting nurse has been requested persistence with medication compliance, CHF management  Shortness of breath Reports symptoms are stable,  Incontinence Recommended she discuss this with primary care, may need referral to urology.Seen  previously seen by urology [facility]   Total encounter time more than 25 minutes  Greater than 50% was spent in counseling and coordination of care with the patient  Disposition:   F/U  6 month   Orders Placed This Encounter  Procedures  . Flu Vaccine QUAD 36+ mos IM  . EKG 12-Lead     Signed, Esmond Plants, M.D.,  Ph.D. 07/22/2016  [place], [place]

## 2016-08-18 ENCOUNTER — Telehealth: Payer: Self-pay | Admitting: Cardiovascular Disease

## 2016-08-18 NOTE — Telephone Encounter (Signed)
Patient being admitted to [facility] and ahc wants to know if they can add physical Therapy and social work.  Please call to give verbal orders  Ditropan is also giving a med interaction alert with K+ that she is currently taking .    Please call Mendel Ryder [phone]

## 2016-08-18 NOTE — Telephone Encounter (Signed)
Spoke w/ Mendel Ryder.  She reports that pt went to the ED since her last ov and is having a hard time getting around.  Gave verbal order her to add PT and social work to pt's [facility] visits. She reports that pt has been on oxybutynin & K+ for some time, she just wanted to make [place] aware of interaction flag. Advised her that interactions flag when an rx is sent in and Dr. Rockey Situ is aware and has overridden this. She is appreciative and will call back w/ any further questions or concerns.

## 2016-08-26 ENCOUNTER — Telehealth: Payer: Self-pay | Admitting: *Deleted

## 2016-08-26 NOTE — Telephone Encounter (Signed)
Patient called wanting to make an appointment for follow up. She was a patient of Dr. Oliva Bustard, last seen in 2014 for right breast cancer. She had a mammogram 02/2016 which recommended follow up in one year. Her PCP ordered the mammogram. I advised her to contact her PCP for follow up and referral with [place] if needed. She verbalized understanding of the paln.

## 2016-10-02 ENCOUNTER — Telehealth: Payer: Self-pay | Admitting: Cardiovascular Disease

## 2016-10-02 NOTE — Telephone Encounter (Signed)
Left voicemail message that was returning call and that I would contact patient directly regarding these concerns.

## 2016-10-02 NOTE — Telephone Encounter (Signed)
Mr. [name] states that the cpap machine is not working correctly and there are some issues with it. He states that she has had lack of sleep and felt this may be causing some of these issues. Concerns that she is not eating proper diet as well. Physical therapist will go back on Monday and instructed him to call us back if blood pressures remain elevated. He thinks that patient would benefit from nursing referral and let him know that PCP may be willing to order that for blood pressure monitoring. He verbalized understanding of our conversation and had no further questions at this time.

## 2016-10-02 NOTE — Telephone Encounter (Signed)
PTA with [facility] called, states he was unable to treat pt yesterday due to elevated BP. States her readings both times he checked it was 202/108, and 10 min later 196/102. States pt was not in any distress. States pt has been complaining she does not sleep. States she is to be getting a new CPAP. Please call.

## 2016-10-02 NOTE — Telephone Encounter (Signed)
Spoke with patient about her elevated blood pressures and asked her if she was able to monitor and record some readings. She states that she does not have a machine or anything to do this and that she feels fine and is not having any problems other than shortness of breath which has been persistent. Let her know that I left message with [facility] with instructions to call back. She verbalized understanding of our conversation and had no further questions at this time.

## 2016-11-11 ENCOUNTER — Telehealth: Payer: Self-pay | Admitting: Cardiovascular Disease

## 2016-11-11 DIAGNOSIS — Z9989 Dependence on other enabling machines and devices: Principal | ICD-10-CM

## 2016-11-11 DIAGNOSIS — G4733 Obstructive sleep apnea (adult) (pediatric): Secondary | ICD-10-CM

## 2016-11-11 NOTE — Telephone Encounter (Signed)
Pt states Dr. Rockey Situ stated he would help her get a CPAP machine. Please call.

## 2016-11-11 NOTE — Telephone Encounter (Signed)
Spoke w/ pt.  Advised her that PCP or pulmonologist will need to order this for her.  Pt is coughing on the phone and states that she does not have a pulmonologist and would like an appt. Call transferred to scheduling to set this up.

## 2016-11-17 ENCOUNTER — Other Ambulatory Visit: Payer: Self-pay

## 2016-11-17 MED ORDER — HYDRALAZINE HCL 50 MG PO TABS: 50.0000 mg | ORAL_TABLET | Freq: Three times a day (TID) | ORAL | 0 refills | Status: DC

## 2016-11-17 MED ORDER — POTASSIUM CHLORIDE CRYS ER 20 MEQ PO TBCR: 20.0000 meq | EXTENDED_RELEASE_TABLET | Freq: Every day | ORAL | 0 refills | Status: DC

## 2016-11-18 ENCOUNTER — Other Ambulatory Visit: Payer: Self-pay

## 2016-11-18 ENCOUNTER — Other Ambulatory Visit: Payer: Self-pay | Admitting: *Deleted

## 2016-11-18 MED ORDER — HYDRALAZINE HCL 50 MG PO TABS: 50.0000 mg | ORAL_TABLET | Freq: Three times a day (TID) | ORAL | 3 refills | Status: DC

## 2016-11-18 MED ORDER — POTASSIUM CHLORIDE CRYS ER 20 MEQ PO TBCR: 20.0000 meq | EXTENDED_RELEASE_TABLET | Freq: Every day | ORAL | 1 refills | Status: DC

## 2016-11-18 NOTE — Telephone Encounter (Signed)
Requested Prescriptions   Signed Prescriptions Disp Refills  . potassium chloride SA (K-DUR,KLOR-CON) 20 MEQ tablet 90 tablet 1    Sig: Take 1 tablet (20 mEq total) by mouth daily.    Authorizing Provider: Minna Merritts    Ordering User: Janan Ridge

## 2016-12-22 ENCOUNTER — Ambulatory Visit: Payer: Medicare PPO | Admitting: Internal Medicine

## 2017-01-26 ENCOUNTER — Ambulatory Visit: Payer: Medicare PPO | Admitting: Internal Medicine

## 2017-01-31 ENCOUNTER — Emergency Department: Payer: Medicare PPO

## 2017-01-31 ENCOUNTER — Encounter: Payer: Self-pay | Admitting: Emergency Medicine

## 2017-01-31 ENCOUNTER — Emergency Department
Admission: EM | Admit: 2017-01-31 | Discharge: 2017-01-31 | Disposition: A | Discharge status: Home / Self Care | Payer: Medicare PPO | Attending: Emergency Medicine | Admitting: Emergency Medicine

## 2017-01-31 DIAGNOSIS — I5022 Chronic systolic (congestive) heart failure: Secondary | ICD-10-CM | POA: Insufficient documentation

## 2017-01-31 DIAGNOSIS — I110 Hypertensive heart disease with heart failure: Secondary | ICD-10-CM | POA: Insufficient documentation

## 2017-01-31 DIAGNOSIS — Z7982 Long term (current) use of aspirin: Secondary | ICD-10-CM | POA: Diagnosis not present

## 2017-01-31 DIAGNOSIS — Z7902 Long term (current) use of antithrombotics/antiplatelets: Secondary | ICD-10-CM | POA: Insufficient documentation

## 2017-01-31 DIAGNOSIS — Z853 Personal history of malignant neoplasm of breast: Secondary | ICD-10-CM | POA: Diagnosis not present

## 2017-01-31 DIAGNOSIS — Z79899 Other long term (current) drug therapy: Secondary | ICD-10-CM | POA: Diagnosis not present

## 2017-01-31 DIAGNOSIS — R0602 Shortness of breath: Secondary | ICD-10-CM

## 2017-01-31 DIAGNOSIS — Z87891 Personal history of nicotine dependence: Secondary | ICD-10-CM | POA: Diagnosis not present

## 2017-01-31 DIAGNOSIS — R0600 Dyspnea, unspecified: Secondary | ICD-10-CM | POA: Diagnosis not present

## 2017-01-31 LAB — BASIC METABOLIC PANEL
Anion gap: 8 (ref 5–15)
BUN: 18 mg/dL (ref 6–20)
CHLORIDE: 102 mmol/L (ref 101–111)
CO2: 31 mmol/L (ref 22–32)
Calcium: 8.9 mg/dL (ref 8.9–10.3)
Creatinine, Ser: 0.7 mg/dL (ref 0.44–1.00)
GFR calc Af Amer: >60 mL/min (ref >60)
GFR calc non Af Amer: >60 mL/min (ref >60)
GLUCOSE: 101 mg/dL — AB (ref 65–99)
Potassium: 3 mmol/L — ABNORMAL LOW (ref 3.5–5.1)
SODIUM: 141 mmol/L (ref 135–145)

## 2017-01-31 LAB — CBC
HEMATOCRIT: 36.1 % (ref 35.0–47.0)
Hemoglobin: 12.2 g/dL (ref 12.0–16.0)
MCH: 27.6 pg (ref 26.0–34.0)
MCHC: 33.7 g/dL (ref 32.0–36.0)
MCV: 82.1 fL (ref 80.0–100.0)
PLATELETS: 257 10*3/uL (ref 150–440)
RBC: 4.4 MIL/uL (ref 3.80–5.20)
RDW: 14.8 % — AB (ref 11.5–14.5)
WBC: 6.6 10*3/uL (ref 3.6–11.0)

## 2017-01-31 LAB — TROPONIN I: Troponin I: <0.03 ng/mL (ref <0.03)

## 2017-01-31 LAB — BRAIN NATRIURETIC PEPTIDE: B Natriuretic Peptide: 100 pg/mL (ref 0.0–100.0)

## 2017-01-31 LAB — FIBRIN DERIVATIVES D-DIMER (ARMC ONLY): Fibrin derivatives D-dimer (ARMC): 651.84 — ABNORMAL HIGH (ref 0.00–499.00)

## 2017-01-31 MED ORDER — IOPAMIDOL (ISOVUE-370) INJECTION 76%
75.0000 mL | Freq: Once | INTRAVENOUS | Status: AC | PRN
  Administered 2017-01-31: 75 mL via INTRAVENOUS

## 2017-01-31 MED ORDER — FUROSEMIDE 10 MG/ML IJ SOLN
80.0000 mg | Freq: Once | INTRAMUSCULAR | Status: AC
  Administered 2017-01-31: 80 mg via INTRAVENOUS
  Filled 2017-01-31: qty 8

## 2017-01-31 NOTE — ED Triage Notes (Signed)
Pt states she has been feeling more short of breath at rest and with exertion, states she has gain over 20 lbs in the past month as well.  Pt c/o pain to lower extremities and increased swelling. States she is having pain to her feet as well.

## 2017-01-31 NOTE — ED Provider Notes (Signed)
[facility] Emergency Department Provider Note   ____________________________________________   First MD Initiated Contact with Patient 01/31/17 1333     (approximate)  I have reviewed the triage vital signs and the nursing notes.   HISTORY  Chief Complaint Shortness of Breath and Leg Swelling   HPI [name] is a 71 y.o. female patient reports she is feeling more short of breath at rest and also with exertion. She is not having any chest pain. She says she's gained about 20 pounds in the last month. She insists she is taking her medicines especially her Lasix but pharmacy is not refill them for many months per our pharmacist. Patient says her legs are swelling a lot more and there somewhat red. She says the ache moderately. His been going on for about a month. Gradual worsening   Past Medical History:  Diagnosis Date  . Abnormality of gait   . Breast cancer ([place])    LT LUMPECTOMY  . CHF (congestive heart failure) ([place])    1) echo 07/2011: EF 20-25%, mild concentric hypertrophy, diffuse HK, regional WMA cannot be excluded, grade 1 DD, mitral valve with mild regurg, LA mildly dilated). 2) EF 40-45%, mild AS,                    . Edema   . GERD (gastroesophageal reflux disease)   . Hypertension   . Hypokinesis    global, EF 35-45 % from Echo.  . Malignant neoplasm of breast (female), unspecified site    left  . Neuropathy   . Obesity   . Pain in joint, site unspecified   . Sleep apnea   . Tinea pedis   . Unspecified hearing loss    bilateral    Patient Active Problem List   Diagnosis Date Noted  . Urinary incontinence 06/27/2016  . Chronic systolic CHF (congestive heart failure) ([place]) 04/20/2013  . HTN (hypertension) 07/16/2011  . CHF (congestive heart failure) ([place]) 07/15/2011  . Shortness of breath 07/15/2011  . Edema 07/15/2011    Past Surgical History:  Procedure Laterality Date  . BREAST LUMPECTOMY     left breast   .  CATARACT EXTRACTION     left eye  . HERNIA REPAIR    . TUBAL LIGATION      Prior to Admission medications   Medication Sig Start Date End Date Taking? Authorizing Provider  aspirin EC 81 MG tablet Take 81 mg by mouth daily.     Yes [provider]  cloNIDine (CATAPRES) 0.2 MG tablet Take 1 tablet (0.2 mg total) by mouth 2 (two) times daily. 06/27/16  Yes Minna Merritts, MD  enalapril (VASOTEC) 10 MG tablet Take 10 mg by mouth daily.   Yes [provider]  furosemide (LASIX) 40 MG tablet Take 2 tablets (80 mg total) by mouth 2 (two) times daily. 04/20/13  Yes Minna Merritts, MD  hydrALAZINE (APRESOLINE) 50 MG tablet Take 1 tablet (50 mg total) by mouth 3 (three) times daily. 11/18/16 11/18/17 Yes Gollan, Kathlene November, MD  omeprazole (PRILOSEC) 20 MG capsule Take 20 mg by mouth daily.   Yes [provider]  oxybutynin (DITROPAN) 5 MG tablet Take 5 mg by mouth 2 (two) times daily.   Yes [provider]  isosorbide mononitrate (IMDUR) 60 MG 24 hr tablet Take 1 tablet (60 mg total) by mouth 2 (two) times daily. 04/20/13 07/22/16  Minna Merritts, MD  potassium chloride SA (K-DUR,KLOR-CON) 20  MEQ tablet Take 1 tablet (20 mEq total) by mouth daily. Patient not taking: Reported on 01/31/2017 11/18/16   Minna Merritts, MD    Allergies Patient has no known allergies.  Family History  Problem Relation Age of Onset  . Hypertension Brother   . Breast cancer Sister     Social History Social History  Substance Use Topics  . Smoking status: Former Smoker    Packs/day: 1.00    Years: 30.00    Types: Cigarettes    Quit date: 07/14/2000  . Smokeless tobacco: Never Used  . Alcohol use No    Review of Systems  Constitutional: No fever/chills Eyes: No visual changes. ENT: No sore throat. Cardiovascular: Denies chest pain. Respiratory: shortness of breath. Gastrointestinal: No abdominal pain.  No nausea, no vomiting.  No diarrhea.  No  constipation. Genitourinary: Negative for dysuria. Musculoskeletal: Negative for back pain. Skin: Negative for rash. Neurological: Negative for headaches, focal weakness or numbness.   ____________________________________________   PHYSICAL EXAM:  VITAL SIGNS: ED Triage Vitals  Enc Vitals Group     BP 01/31/17 1301 (!) 141/63     Pulse Rate 01/31/17 1249 75     Resp 01/31/17 1249 20     Temp 01/31/17 1249 99 F (37.2 C)     Temp src --      SpO2 01/31/17 1249 97 %     Weight 01/31/17 1249 272 lb (123.4 kg)     Height 01/31/17 1249 5\' 10"  (1.778 m)     Head Circumference --      Peak Flow --      Pain Score 01/31/17 1249 8     Pain Loc --      Pain Edu? --      Excl. in [place]? --     Constitutional: Alert and oriented. Well appearing and in no acute distress. Eyes: Conjunctivae are normal.  Head: Atraumatic. Nose: No congestion/rhinnorhea. Mouth/Throat: Mucous membranes are moist.  Oropharynx non-erythematous. Neck: No stridor.  { Cardiovascular: Normal rate, regular rhythm. Grossly normal heart sounds.  Good peripheral circulation. Respiratory: Normal respiratory effort.  No retractions. Lungs CTAB. Gastrointestinal: Soft and nontender. No distention. No abdominal bruits. No CVA tenderness. Musculoskeletal: No lower extremity tenderness Bilateral edema, both legs have some erythema and slight warmth especially more warmth in the left arm and the right one from the just above the ankles to about 8 inches below the knees..  No joint effusions. Neurologic:  Normal speech and language. No gross focal neurologic deficits are appreciated. No gait instability. Skin:  Skin is warm, dry and intact.  Psychiatric: Mood and affect are normal. Speech and behavior are normal.  ____________________________________________   LABS (all labs ordered are listed, but only abnormal results are displayed)  Labs Reviewed  BASIC METABOLIC PANEL - Abnormal; Notable for the following:        Result Value   Potassium 3.0 (*)    Glucose, Bld 101 (*)    All other components within normal limits  CBC - Abnormal; Notable for the following:    RDW 14.8 (*)    All other components within normal limits  TROPONIN I  BRAIN NATRIURETIC PEPTIDE  FIBRIN DERIVATIVES D-DIMER ([facility] ONLY)   ____________________________________________  EKG  EKG read and interpreted by me shows normal sinus rhythm rate of 78 flipped T's in 1 and L flattening in V5 and 6 perhaps slightly worse in November of last year. ____________________________________________  RADIOLOGY Chest x-ray IMPRESSION: No active cardiopulmonary  disease. Patient does have cardiomegaly and to me looks like there is a little bit of cephalization of the vessels  ____________________________________________   PROCEDURES  Procedure(s) performed:  Procedures  Critical Care performed:  ____________________________________________   INITIAL IMPRESSION / ASSESSMENT AND PLAN / ED COURSE  Pertinent labs & imaging results that were available during my care of the patient were reviewed by me and considered in my medical decision making (see chart for details).  Dr. Corky Downs will follow-up T d-dimer and the ultrasound of the legs dispel was appropriate     ____________________________________________   FINAL CLINICAL IMPRESSION(S) / ED DIAGNOSES  Final diagnoses:  Dyspnea, unspecified type      NEW MEDICATIONS STARTED DURING THIS VISIT:  New Prescriptions   No medications on file     Note:  This document was prepared using Dragon voice recognition software and may include unintentional dictation errors.    Nena Polio, MD 01/31/17 [phone]

## 2017-01-31 NOTE — Discharge Instructions (Signed)
Please follow up with your doctor regarding the mass on your thyroid that was seen on your CT scan. Return to the ED if you feel your breathing is worsening. Please use your cpap if you can

## 2017-01-31 NOTE — ED Provider Notes (Signed)
I was asked to follow up on CT scan by Dr. Cinda Quest. Discussed CT results with the patient including thyroid mass that needs follow-up by her PCP, she will do so. She reports she feels significantly better and reports her breathing has improved greatly and she feels at her baseline and she would like to go home. I did offer admission but she refused. She states she will return immediately if any change in her condition. I feel this is reasonable, strict return precautions discussed.   Lavonia Drafts, MD 01/31/17 Darlin Drop

## 2017-01-31 NOTE — ED Notes (Signed)
Bedside commode in place.

## 2017-02-09 ENCOUNTER — Other Ambulatory Visit: Payer: Self-pay | Admitting: Family Medicine

## 2017-02-09 DIAGNOSIS — Z1231 Encounter for screening mammogram for malignant neoplasm of breast: Secondary | ICD-10-CM

## 2017-02-10 ENCOUNTER — Other Ambulatory Visit: Payer: Self-pay | Admitting: Family Medicine

## 2017-02-10 DIAGNOSIS — E079 Disorder of thyroid, unspecified: Secondary | ICD-10-CM

## 2017-02-16 ENCOUNTER — Ambulatory Visit: Payer: Medicare PPO

## 2017-02-23 ENCOUNTER — Ambulatory Visit: Payer: Medicare PPO | Admitting: Internal Medicine

## 2017-02-26 ENCOUNTER — Other Ambulatory Visit: Payer: Self-pay | Admitting: *Deleted

## 2017-02-27 ENCOUNTER — Ambulatory Visit (INDEPENDENT_AMBULATORY_CARE_PROVIDER_SITE_OTHER): Payer: Medicare PPO | Admitting: Internal Medicine

## 2017-02-27 ENCOUNTER — Encounter: Payer: Self-pay | Admitting: Internal Medicine

## 2017-02-27 VITALS — BP 177/90 | HR 72 | Resp 16 | Ht 70.0 in | Wt 271.0 lb

## 2017-02-27 DIAGNOSIS — G4719 Other hypersomnia: Secondary | ICD-10-CM | POA: Diagnosis not present

## 2017-02-27 NOTE — Progress Notes (Signed)
Name: [name] MRN: [id number] DOB: 08/02/1946     CONSULTATION DATE: 02/27/2017  REFERRING MD :  Dr. Rockey Situ   CHIEF COMPLAINT: Excessive daytime sleepiness    HISTORY OF PRESENT ILLNESS:   71 year old Afro-American female seen today for excessive daytime sleepiness Patient was diagnosed with sleep apnea approximately 10 years ago Patient has excessive daytime sleepiness for the last several years Patient gets tired very easily at the end of the day   Patient has been having extreme fatigue and tiredness, lack of energy Patient has been told that she has very  Loud snoring every night Patient has been told that she struggles to breathe at night and gasps for air  Patient has significant cardiomyopathy with ejection fraction of 20% Patient has lower extremity swelling No signs of infection at this time Patient has chronic shortness of breath and chronic dyspnea on exertion Patient denies cough patient denies wheezing  Patient quit tobacco abuse 1999 Patient smoked for approximately 10 years 2 packs per day  Patient has a history of breast cancer 15 years ago Patient has a history of severe CHF and sees Dr. Candis Musa with cardiology  No signs of infection at this time Patient showing signs of chronic CHF with lower extremity edema  No acute respirator distress at this time   PAST MEDICAL HISTORY :   has a past medical history of Abnormality of gait; Breast cancer ([place]); CHF (congestive heart failure) ([place]); Edema; GERD (gastroesophageal reflux disease); Hypertension; Hypokinesis; Malignant neoplasm of breast (female), unspecified site; Neuropathy; Obesity; Pain in joint, site unspecified; Sleep apnea; Tinea pedis; and Unspecified hearing loss.  has a past surgical history that includes Hernia repair; Breast lumpectomy; Tubal ligation; and Cataract extraction. Prior to Admission medications   Medication Sig Start Date End Date Taking? Authorizing Provider  aspirin EC  81 MG tablet Take 81 mg by mouth daily.     Yes [provider]  cloNIDine (CATAPRES) 0.2 MG tablet Take 1 tablet (0.2 mg total) by mouth 2 (two) times daily. 06/27/16  Yes Minna Merritts, MD  enalapril (VASOTEC) 10 MG tablet Take 10 mg by mouth daily.   Yes [provider]  furosemide (LASIX) 40 MG tablet Take 2 tablets (80 mg total) by mouth 2 (two) times daily. 04/20/13  Yes Minna Merritts, MD  hydrALAZINE (APRESOLINE) 50 MG tablet Take 1 tablet (50 mg total) by mouth 3 (three) times daily. 11/18/16 11/18/17 Yes Gollan, Kathlene November, MD  isosorbide mononitrate (IMDUR) 60 MG 24 hr tablet Take 1 tablet (60 mg total) by mouth 2 (two) times daily. 04/20/13 02/27/17 Yes Gollan, Kathlene November, MD  omeprazole (PRILOSEC) 20 MG capsule Take 20 mg by mouth daily.   Yes [provider]  oxybutynin (DITROPAN) 5 MG tablet Take 5 mg by mouth 2 (two) times daily.   Yes [provider]  pantoprazole (PROTONIX) 40 MG tablet Take 40 mg by mouth daily. 02/18/16  Yes [provider]  potassium chloride SA (K-DUR,KLOR-CON) 20 MEQ tablet Take 1 tablet (20 mEq total) by mouth daily. 11/18/16  Yes Gollan, Kathlene November, MD   No Known Allergies  FAMILY HISTORY:  family history includes Breast cancer in her sister; Hypertension in her brother. SOCIAL HISTORY:  reports that she quit smoking about 16 years ago. Her smoking use included Cigarettes. She has a 30.00 pack-year smoking history. She has never used smokeless tobacco. She reports that she does not drink alcohol or use drugs.  REVIEW OF SYSTEMS:  Constitutional: Negative for fever, chills, weight loss, + malaise/fatigue and diaphoresis.  HENT: Negative for hearing loss, ear pain, nosebleeds, congestion, sore throat, neck pain, tinnitus and ear discharge.   Eyes: Negative for blurred vision, double vision, photophobia, pain, discharge and redness.  Respiratory: Negative for cough, hemoptysis, sputum production, + shortness of  breath, wheezing and stridor.   Cardiovascular: Negative for chest pain, palpitations, orthopnea, claudication, leg swelling and PND.  Gastrointestinal: Negative for heartburn, nausea, vomiting, abdominal pain, diarrhea, constipation, blood in stool and melena.  Genitourinary: Negative for dysuria, urgency, frequency, hematuria and flank pain.  Musculoskeletal: Negative for myalgias, back pain, joint pain and falls.  Skin: Negative for itching and rash.  Neurological: Negative for dizziness, tingling, tremors, sensory change, speech change, focal weakness, seizures, loss of consciousness, weakness and headaches.  Endo/Heme/Allergies: Negative for environmental allergies and polydipsia. Does not bruise/bleed easily.  ALL OTHER ROS ARE NEGATIVE  BP (!) 177/90 (BP Location: Left Arm, Patient Position: Sitting, Cuff Size: Large)   Pulse 72   Resp 16   Ht 5\' 10"  (1.778 m)   Wt 271 lb (122.9 kg)   SpO2 95%   BMI 38.88 kg/m    Physical Examination:   GENERAL:NAD, no fevers, chills, no weakness no fatigue HEAD: Normocephalic, atraumatic.  EYES: Pupils equal, round, reactive to light. Extraocular muscles intact. No scleral icterus.  MOUTH: Moist mucosal membrane.   EAR, NOSE, THROAT: Clear without exudates. No external lesions.  NECK: Supple. No thyromegaly. No nodules. No JVD.  PULMONARY:CTA B/L no wheezes, no crackles, no rhonchi CARDIOVASCULAR: S1 and S2. Regular rate and rhythm. No murmurs, rubs, or gallops. + edema.  GASTROINTESTINAL: Soft, nontender, nondistended. No masses. Positive bowel sounds.  MUSCULOSKELETAL: No swelling, clubbing, or edema. Range of motion full in all extremities.  NEUROLOGIC: Cranial nerves II through XII are intact. No gross focal neurological deficits.  SKIN: No ulceration, lesions, rashes, or cyanosis. Skin warm and dry. Turgor intact.  PSYCHIATRIC: Mood, affect within normal limits. The patient is awake, alert and oriented x 3. Insight, judgment intact.     Chest x-ray 01/31/2017 I have Independently reviewed images of  CXR   on 02/27/2017 Interpretation: No opacities no effusions no active disease noted  Echocardiogram November 2012 Ejection fraction of 20%  ASSESSMENT / PLAN: 71 year old pleasant African-American female with a previous history and diagnosis of sleep apnea with excessive daytime sleepiness loud snoring and gasping for air in the setting of severe cardiomyopathy with EF of 20% along with morbid obesity and deconditioned state  #1 excessive daytime sleepiness with previous diagnosis of sleep apnea Patient will need split night study with bleed in oxygen as needed for further assessment and diagnosis Follow-up after test completed  #2 shortness of breath and dyspnea on exertion most likely related to severe cardiopathy The last EF noted on echocardiogram was 20% Continue Lasix as prescribed Continue to follow-up with cardiology as scheduled  #3 Obesity -recommend significant weight loss -recommend changing diet  #4 Deconditioned state -increased daily activity    Patient satisfied with Plan of action and management. All questions answered  Corrin Parker, M.D.  [facility]  Medical Director [place] Director [facility] Cardio-Pulmonary Department

## 2017-02-27 NOTE — Patient Instructions (Signed)
Patient will need sleep study to assess for sleep apnea Follow-up after test completed

## 2017-03-11 NOTE — Progress Notes (Signed)
Cardiology Office Note  Date:  03/12/2017   ID:  [name], DOB 22-Jul-1946, MRN [id number]  PCP:  [name] Lank, MD   Chief Complaint  Patient presents with  . other    6 month follow up. Pt. c/o shortness of breath with LE edema; pt. has not taken her fluid pill today. Meds reviewed by the pt. verbally.     HPI:  [name] is a very pleasant 71--year-old woman with  Medication confusion/Medication noncompliance morbid obesity,  severe lower extremity edema,  systolic CHF, ejection fraction 25%  (repeat echocardiogram pending) sleep apnea,  pulmonary disease,  severe hypertension,  likely secondary to medication noncompliance who presents For Follow-up of her hypertension, cardiomyopathy, edema  In follow-up today, she presents in a wheelchair She reports that she is compliant with her Lasix 40 mg twice a day Recently in the emergency room May 2018 for leg swelling and shortness of breath She has had persistent cough Now with a scale at home, reports weight is proximally 270 pounds chronic knee pain Blood pressure also elevated on today's visit  We've previously had increased doses of clonidine and isosorbide We have recommended she take these twice a day, she was only taking this once a day on her last clinic visit  she does a pillbox but misses her medications frequently  EKG on today's visit shows normal sinus rhythm with rate 52 bpm, T-wave abnormality in 1 and aVL. Heart rate slower on today's visit  Other past medical history reviewed Walking less, legs are getting weaker, gait instability  CPAP broken, was supposed to get new machine to primary care, has not received this yet Not sleeping well without her machine  On previous office visit in 2014, she reported having occasional skin breakdown,  weeping sores, worse after spending time on her feet she denies any significant shortness of breath.    PMH:   has a past medical history of Abnormality of gait;  Breast cancer ([place]); CHF (congestive heart failure) ([place]); Edema; GERD (gastroesophageal reflux disease); Hypertension; Hypokinesis; Malignant neoplasm of breast (female), unspecified site; Neuropathy; Obesity; Pain in joint, site unspecified; Sleep apnea; Tinea pedis; and Unspecified hearing loss.  PSH:    Past Surgical History:  Procedure Laterality Date  . BREAST LUMPECTOMY     left breast   . CATARACT EXTRACTION     left eye  . HERNIA REPAIR    . TUBAL LIGATION      Current Outpatient Prescriptions  Medication Sig Dispense Refill  . aspirin EC 81 MG tablet Take 81 mg by mouth daily.      . cloNIDine (CATAPRES) 0.2 MG tablet Take 1 tablet (0.2 mg total) by mouth 2 (two) times daily. 60 tablet 11  . enalapril (VASOTEC) 10 MG tablet Take 10 mg by mouth daily.    . furosemide (LASIX) 40 MG tablet Take 2 tablets (80 mg total) by mouth 2 (two) times daily. 240 tablet 6  . hydrALAZINE (APRESOLINE) 50 MG tablet Take 1 tablet (50 mg total) by mouth 3 (three) times daily. 90 tablet 3  . isosorbide mononitrate (IMDUR) 60 MG 24 hr tablet Take 1 tablet (60 mg total) by mouth 2 (two) times daily. 180 tablet 3  . omeprazole (PRILOSEC) 20 MG capsule Take 20 mg by mouth daily.    Marland Kitchen oxybutynin (DITROPAN) 5 MG tablet Take 5 mg by mouth 2 (two) times daily.    . pantoprazole (PROTONIX) 40 MG tablet Take 40 mg by mouth daily.    Marland Kitchen  potassium chloride SA (K-DUR,KLOR-CON) 20 MEQ tablet Take 1 tablet (20 mEq total) by mouth daily. 90 tablet 1   No current facility-administered medications for this visit.      Allergies:   Patient has no known allergies.   Social History:  The patient  reports that she quit smoking about 16 years ago. Her smoking use included Cigarettes. She has a 30.00 pack-year smoking history. She has never used smokeless tobacco. She reports that she does not drink alcohol or use drugs.   Family History:   family history includes Breast cancer in her sister; Hypertension in her  brother.    Review of Systems: Review of Systems  Constitutional: Negative.   Respiratory: Positive for shortness of breath.   Cardiovascular: Positive for leg swelling.  Gastrointestinal: Negative.   Musculoskeletal: Negative.        Gait instability  Neurological: Negative.   Psychiatric/Behavioral: Negative.   All other systems reviewed and are negative.    PHYSICAL EXAM: VS:  BP (!) 170/80 (BP Location: Right Arm, Patient Position: Sitting, Cuff Size: Large)   Pulse (!) 55   Ht 5\' 10"  (1.778 m)   Wt 268 lb (121.6 kg)   BMI 38.45 kg/m  , BMI Body mass index is 38.45 kg/m. GEN: Well nourished, well developed, in no acute distress, obese, presents in a wheelchair  HEENT: normal  Neck: no JVD, carotid bruits, or masses Cardiac: RRR; no murmurs, rubs, or gallops, 1+ edema bilaterally below the knees, tender to palpation Respiratory:  clear to auscultation bilaterally, expiratory wheezes, normal work of breathing GI: soft, nontender, nondistended, + BS MS: no deformity or atrophy  Skin: warm and dry, no rash Neuro:  Strength and sensation are intact Psych: euthymic mood, full affect    Recent Labs: 01/31/2017: B Natriuretic Peptide 100.0; BUN 18; Creatinine, Ser 0.70; Hemoglobin 12.2; Platelets 257; Potassium 3.0; Sodium 141    Lipid Panel No results found for: CHOL, HDL, LDLCALC, TRIG    Wt Readings from Last 3 Encounters:  03/12/17 268 lb (121.6 kg)  02/27/17 271 lb (122.9 kg)  01/31/17 272 lb (123.4 kg)       ASSESSMENT AND PLAN:  Congestive heart failure, unspecified congestive heart failure chronicity, unspecified congestive heart failure type ([place]) - Plan: EKG 12-Lead Significant leg swelling on today's visit Recent visit to the emergency room for shortness of breath, leg swelling Recommended she stop the Lasix, start torsemide 40 mg twice a day We will increase potassium up to 20 mEq twice a day Recommended we order visiting nurses to help with CHF  management and medication compliance  Chronic systolic CHF (congestive heart failure) ([place]) Changes as above with increased potassium, change to diuretic, Needs daily weights echocardiogram ordered to assess LV function me last was 6 years ago  Essential hypertension, malignant She needs help at home, With her pillbox and medications. Significant medication noncompliance/confusion. We will request visiting nurses  Shortness of breath worsening symptoms of shortness of breath, cough, leg edema consistent with acute on chronic systolic CHF   Incontinence chronic issue    Total encounter time more than 25 minutes  Greater than 50% was spent in counseling and coordination of care with the patient  Disposition:   F/U  1 month   Orders Placed This Encounter  Procedures  . EKG 12-Lead     Signed, Esmond Plants, M.D., Ph.D. 03/12/2017  [place], [place]

## 2017-03-12 ENCOUNTER — Ambulatory Visit (INDEPENDENT_AMBULATORY_CARE_PROVIDER_SITE_OTHER): Payer: Medicare PPO | Admitting: Cardiovascular Disease

## 2017-03-12 ENCOUNTER — Encounter: Payer: Self-pay | Admitting: Cardiovascular Disease

## 2017-03-12 VITALS — BP 170/80 | HR 55 | Ht 70.0 in | Wt 268.0 lb

## 2017-03-12 DIAGNOSIS — R0602 Shortness of breath: Secondary | ICD-10-CM | POA: Diagnosis not present

## 2017-03-12 DIAGNOSIS — I10 Essential (primary) hypertension: Secondary | ICD-10-CM

## 2017-03-12 DIAGNOSIS — I5022 Chronic systolic (congestive) heart failure: Secondary | ICD-10-CM

## 2017-03-12 DIAGNOSIS — R600 Localized edema: Secondary | ICD-10-CM | POA: Diagnosis not present

## 2017-03-12 MED ORDER — HYDRALAZINE HCL 50 MG PO TABS: 100.0000 mg | ORAL_TABLET | Freq: Three times a day (TID) | ORAL | 3 refills | Status: DC

## 2017-03-12 MED ORDER — POTASSIUM CHLORIDE CRYS ER 20 MEQ PO TBCR: 20.0000 meq | EXTENDED_RELEASE_TABLET | Freq: Two times a day (BID) | ORAL | 6 refills | Status: DC

## 2017-03-12 MED ORDER — TORSEMIDE 20 MG PO TABS: 40.0000 mg | ORAL_TABLET | Freq: Two times a day (BID) | ORAL | 6 refills | Status: DC

## 2017-03-12 NOTE — Patient Instructions (Addendum)
Medication Instructions:   Please stop the lasix Start torsemide 40 mg twice a day (for leg swelling)  Please increase the potassium up to 2 a day (for cramping)  Please Increase the hydralazine up to 100 mg three times a day For high blood pressure  Labwork:  No new labs needed  Testing/Procedures:  We will order an echocardiogram for cardiomyopathy, leg edema Echocardiography is a painless test that uses sound waves to create images of your heart. It provides your doctor with information about the size and shape of your heart and how well your heart's chambers and valves are working. This procedure takes approximately one hour. There are no restrictions for this procedure.  We have placed on order for home health to come out and evaluate you. You will receive a call from [facility].  Follow-Up: It was a pleasure seeing you in the office today. Please call us if you have new issues that need to be addressed before your next appt.  [phone]  Your physician wants you to follow-up in: 1 month.    If you need a refill on your cardiac medications before your next appointment, please call your pharmacy.    Echocardiogram An echocardiogram, or echocardiography, uses sound waves (ultrasound) to produce an image of your heart. The echocardiogram is simple, painless, obtained within a short period of time, and offers valuable information to your health care provider. The images from an echocardiogram can provide information such as:  Evidence of coronary artery disease (CAD).  Heart size.  Heart muscle function.  Heart valve function.  Aneurysm detection.  Evidence of a past heart attack.  Fluid buildup around the heart.  Heart muscle thickening.  Assess heart valve function.  Tell a health care provider about:  Any allergies you have.  All medicines you are taking, including vitamins, herbs, eye drops, creams, and over-the-counter medicines.  Any  problems you or family members have had with anesthetic medicines.  Any blood disorders you have.  Any surgeries you have had.  Any medical conditions you have.  Whether you are pregnant or may be pregnant. What happens before the procedure? No special preparation is needed. Eat and drink normally. What happens during the procedure?  In order to produce an image of your heart, gel will be applied to your chest and a wand-like tool (transducer) will be moved over your chest. The gel will help transmit the sound waves from the transducer. The sound waves will harmlessly bounce off your heart to allow the heart images to be captured in real-time motion. These images will then be recorded.  You may need an IV to receive a medicine that improves the quality of the pictures. What happens after the procedure? You may return to your normal schedule including diet, activities, and medicines, unless your health care provider tells you otherwise. This information is not intended to replace advice given to you by your health care provider. Make sure you discuss any questions you have with your health care provider. Document Released: 08/22/2000 Document Revised: 04/12/2016 Document Reviewed: 05/02/2013 Elsevier Interactive Patient Education  2017 [employer].

## 2017-03-16 ENCOUNTER — Telehealth: Payer: Self-pay | Admitting: Cardiovascular Disease

## 2017-03-16 NOTE — Telephone Encounter (Signed)
Spoke w/ [name]. Advised her that we will sign off of this for pt.  She will upload to [facility] website for review and electronic signature.

## 2017-03-16 NOTE — Telephone Encounter (Signed)
[name] from [facility] calling stating they admitted her on saturday for physical therapy   Would like to make sure we will sign the orders   Please advise.

## 2017-03-17 ENCOUNTER — Telehealth: Payer: Self-pay | Admitting: Cardiovascular Disease

## 2017-03-17 NOTE — Telephone Encounter (Signed)
Number of visits detailed should be fine

## 2017-03-17 NOTE — Telephone Encounter (Signed)
Dr Rockey Situ, are you agreeable to these home health orders?

## 2017-03-17 NOTE — Telephone Encounter (Signed)
Physical therapist from [place] calling stating he just saw patient for evaluation  He thinks she will benefit from it but will need it twice a week for two weeks and then once a week for one week With a total of 6 visits   He would like orders on this Please advise.

## 2017-03-17 NOTE — Telephone Encounter (Signed)
S/w Skeet Simmer, physical therapist, that Dr. Rockey Situ is agreeable to proposed PT schedule.

## 2017-03-19 ENCOUNTER — Ambulatory Visit
Admission: RE | Admit: 2017-03-19 | Discharge: 2017-03-19 | Disposition: A | Discharge status: Home / Self Care | Payer: Medicare PPO | Source: Ambulatory Visit | Attending: Family Medicine | Admitting: Family Medicine

## 2017-03-19 DIAGNOSIS — Z1231 Encounter for screening mammogram for malignant neoplasm of breast: Secondary | ICD-10-CM | POA: Insufficient documentation

## 2017-03-25 ENCOUNTER — Ambulatory Visit: Payer: Medicare PPO | Attending: Internal Medicine

## 2017-03-25 ENCOUNTER — Other Ambulatory Visit: Payer: Self-pay | Admitting: Cardiovascular Disease

## 2017-03-25 DIAGNOSIS — G4761 Periodic limb movement disorder: Secondary | ICD-10-CM | POA: Diagnosis not present

## 2017-03-25 DIAGNOSIS — R600 Localized edema: Secondary | ICD-10-CM

## 2017-03-25 DIAGNOSIS — G4733 Obstructive sleep apnea (adult) (pediatric): Secondary | ICD-10-CM | POA: Diagnosis not present

## 2017-03-25 DIAGNOSIS — I5022 Chronic systolic (congestive) heart failure: Secondary | ICD-10-CM

## 2017-03-25 DIAGNOSIS — I110 Hypertensive heart disease with heart failure: Secondary | ICD-10-CM | POA: Insufficient documentation

## 2017-03-25 DIAGNOSIS — R0602 Shortness of breath: Secondary | ICD-10-CM

## 2017-03-25 DIAGNOSIS — R400 Somnolence: Secondary | ICD-10-CM | POA: Diagnosis present

## 2017-03-27 ENCOUNTER — Telehealth: Payer: Self-pay | Admitting: Cardiovascular Disease

## 2017-03-27 NOTE — Telephone Encounter (Signed)
[facility] calling stating he just saw patient for final visit He states he doesn't think she needs anymore visit with nurse But has a few more visits with physical therapy BP was a little high 134/92  Weight down about 10 pounds, eating better.  She does say here and there she's having muscle cramps in both her legs They have been going on, even from when she was in hospital Patient did have a fall last night, she was getting into bed at sleep study clinic, she feel on the floor. They called EMS but she denies any injury from that  He states if we have any concerns or questions please feel free to call back

## 2017-03-27 NOTE — Telephone Encounter (Signed)
Evelena Peat RN just calling to make [place] aware that he is discharging from their care. Blood pressure was 134/92 and she has been having leg cramps to both legs since discharge from the hospital. He reports her compliance with medications has been very good and that her children are enforcing the diet restrictions that he recommended which has her weight down by 10 pounds. He states that she did fall last night when she went to the sleep study clinic and they had to call EMS to get her up from the floor. Overall he states that she has improved and doing better and that PT is going to continue for a few more visits. Let him know that I would pass this information over to Dr. Rockey Situ and to give [place] a call back if there are any further concerns.

## 2017-03-28 ENCOUNTER — Observation Stay (HOSPITAL_BASED_OUTPATIENT_CLINIC_OR_DEPARTMENT_OTHER)
Admit: 2017-03-28 | Discharge: 2017-03-28 | Disposition: A | Discharge status: Home / Self Care | Payer: Medicare PPO | Attending: Internal Medicine | Admitting: Internal Medicine

## 2017-03-28 ENCOUNTER — Observation Stay
Admission: EM | Admit: 2017-03-28 | Discharge: 2017-03-28 | Disposition: A | Discharge status: Home / Self Care | Payer: Medicare PPO | Attending: Internal Medicine | Admitting: Internal Medicine

## 2017-03-28 ENCOUNTER — Emergency Department: Payer: Medicare PPO

## 2017-03-28 ENCOUNTER — Observation Stay: Payer: Medicare PPO

## 2017-03-28 ENCOUNTER — Encounter: Payer: Self-pay | Admitting: Emergency Medicine

## 2017-03-28 DIAGNOSIS — H9193 Unspecified hearing loss, bilateral: Secondary | ICD-10-CM | POA: Diagnosis not present

## 2017-03-28 DIAGNOSIS — G629 Polyneuropathy, unspecified: Secondary | ICD-10-CM | POA: Insufficient documentation

## 2017-03-28 DIAGNOSIS — I110 Hypertensive heart disease with heart failure: Secondary | ICD-10-CM | POA: Insufficient documentation

## 2017-03-28 DIAGNOSIS — Z803 Family history of malignant neoplasm of breast: Secondary | ICD-10-CM | POA: Diagnosis not present

## 2017-03-28 DIAGNOSIS — Z79899 Other long term (current) drug therapy: Secondary | ICD-10-CM | POA: Insufficient documentation

## 2017-03-28 DIAGNOSIS — Z9851 Tubal ligation status: Secondary | ICD-10-CM | POA: Insufficient documentation

## 2017-03-28 DIAGNOSIS — Z853 Personal history of malignant neoplasm of breast: Secondary | ICD-10-CM | POA: Diagnosis not present

## 2017-03-28 DIAGNOSIS — Z9889 Other specified postprocedural states: Secondary | ICD-10-CM | POA: Insufficient documentation

## 2017-03-28 DIAGNOSIS — Z7982 Long term (current) use of aspirin: Secondary | ICD-10-CM | POA: Diagnosis not present

## 2017-03-28 DIAGNOSIS — G459 Transient cerebral ischemic attack, unspecified: Secondary | ICD-10-CM | POA: Diagnosis not present

## 2017-03-28 DIAGNOSIS — I5022 Chronic systolic (congestive) heart failure: Secondary | ICD-10-CM | POA: Diagnosis not present

## 2017-03-28 DIAGNOSIS — K219 Gastro-esophageal reflux disease without esophagitis: Secondary | ICD-10-CM | POA: Insufficient documentation

## 2017-03-28 DIAGNOSIS — E669 Obesity, unspecified: Secondary | ICD-10-CM | POA: Diagnosis not present

## 2017-03-28 DIAGNOSIS — Z6837 Body mass index (BMI) 37.0-37.9, adult: Secondary | ICD-10-CM | POA: Diagnosis not present

## 2017-03-28 DIAGNOSIS — Z87891 Personal history of nicotine dependence: Secondary | ICD-10-CM | POA: Diagnosis not present

## 2017-03-28 DIAGNOSIS — G4730 Sleep apnea, unspecified: Secondary | ICD-10-CM | POA: Diagnosis not present

## 2017-03-28 DIAGNOSIS — M2550 Pain in unspecified joint: Secondary | ICD-10-CM | POA: Diagnosis not present

## 2017-03-28 DIAGNOSIS — Z9842 Cataract extraction status, left eye: Secondary | ICD-10-CM | POA: Diagnosis not present

## 2017-03-28 DIAGNOSIS — I361 Nonrheumatic tricuspid (valve) insufficiency: Secondary | ICD-10-CM

## 2017-03-28 DIAGNOSIS — R079 Chest pain, unspecified: Secondary | ICD-10-CM | POA: Insufficient documentation

## 2017-03-28 DIAGNOSIS — R32 Unspecified urinary incontinence: Secondary | ICD-10-CM | POA: Diagnosis not present

## 2017-03-28 DIAGNOSIS — Z8249 Family history of ischemic heart disease and other diseases of the circulatory system: Secondary | ICD-10-CM | POA: Insufficient documentation

## 2017-03-28 LAB — COMPREHENSIVE METABOLIC PANEL
ALBUMIN: 3.7 g/dL (ref 3.5–5.0)
ALK PHOS: 55 U/L (ref 38–126)
ALT: 10 U/L — ABNORMAL LOW (ref 14–54)
ANION GAP: 7 (ref 5–15)
AST: 17 U/L (ref 15–41)
BILIRUBIN TOTAL: 0.6 mg/dL (ref 0.3–1.2)
BUN: 38 mg/dL — AB (ref 6–20)
CALCIUM: 8.9 mg/dL (ref 8.9–10.3)
CO2: 31 mmol/L (ref 22–32)
Chloride: 100 mmol/L — ABNORMAL LOW (ref 101–111)
Creatinine, Ser: 1.58 mg/dL — ABNORMAL HIGH (ref 0.44–1.00)
GFR calc Af Amer: 37 mL/min — ABNORMAL LOW (ref >60)
GFR, EST NON AFRICAN AMERICAN: 32 mL/min — AB (ref >60)
GLUCOSE: 123 mg/dL — AB (ref 65–99)
Potassium: 4.1 mmol/L (ref 3.5–5.1)
Sodium: 138 mmol/L (ref 135–145)
TOTAL PROTEIN: 7.5 g/dL (ref 6.5–8.1)

## 2017-03-28 LAB — CBC
HCT: 35.3 % (ref 35.0–47.0)
HEMATOCRIT: 36.9 % (ref 35.0–47.0)
HEMOGLOBIN: 12.5 g/dL (ref 12.0–16.0)
HEMOGLOBIN: 11.9 g/dL — AB (ref 12.0–16.0)
MCH: 27.8 pg (ref 26.0–34.0)
MCH: 27.9 pg (ref 26.0–34.0)
MCHC: 33.9 g/dL (ref 32.0–36.0)
MCHC: 33.6 g/dL (ref 32.0–36.0)
MCV: 82 fL (ref 80.0–100.0)
MCV: 83 fL (ref 80.0–100.0)
Platelets: 275 10*3/uL (ref 150–440)
Platelets: 238 10*3/uL (ref 150–440)
RBC: 4.5 MIL/uL (ref 3.80–5.20)
RBC: 4.25 MIL/uL (ref 3.80–5.20)
RDW: 14.4 % (ref 11.5–14.5)
RDW: 14.1 % (ref 11.5–14.5)
WBC: 9.2 10*3/uL (ref 3.6–11.0)
WBC: 7.2 10*3/uL (ref 3.6–11.0)

## 2017-03-28 LAB — DIFFERENTIAL
Basophils Absolute: 0.1 10*3/uL (ref 0–0.1)
Basophils Relative: 1 %
EOS ABS: 0.1 10*3/uL (ref 0–0.7)
EOS PCT: 1 %
LYMPHS ABS: 1.9 10*3/uL (ref 1.0–3.6)
LYMPHS PCT: 21 %
MONOS PCT: 12 %
Monocytes Absolute: 1.1 10*3/uL — ABNORMAL HIGH (ref 0.2–0.9)
NEUTROS PCT: 65 %
Neutro Abs: 6 10*3/uL (ref 1.4–6.5)

## 2017-03-28 LAB — ECHOCARDIOGRAM COMPLETE
HEIGHTINCHES: 69 in
WEIGHTICAEL: 4027.2 [oz_av]

## 2017-03-28 LAB — CREATININE, SERUM
CREATININE: 1.33 mg/dL — AB (ref 0.44–1.00)
GFR calc Af Amer: 45 mL/min — ABNORMAL LOW (ref >60)
GFR calc non Af Amer: 39 mL/min — ABNORMAL LOW (ref >60)

## 2017-03-28 LAB — APTT: aPTT: 25 seconds (ref 24–36)

## 2017-03-28 LAB — LIPID PANEL
CHOL/HDL RATIO: 3.4 ratio
CHOLESTEROL: 141 mg/dL (ref 0–200)
HDL: 42 mg/dL (ref >40)
LDL Cholesterol: 91 mg/dL (ref 0–99)
TRIGLYCERIDES: 40 mg/dL (ref <150)
VLDL: 8 mg/dL (ref 0–40)

## 2017-03-28 LAB — TROPONIN I: Troponin I: <0.03 ng/mL (ref <0.03)

## 2017-03-28 LAB — PROTIME-INR
INR: 1.07
Prothrombin Time: 13.9 seconds (ref 11.4–15.2)

## 2017-03-28 MED ORDER — ACETAMINOPHEN 325 MG PO TABS: 650.0000 mg | ORAL_TABLET | ORAL | Status: DC | PRN

## 2017-03-28 MED ORDER — SODIUM CHLORIDE 0.9% FLUSH
3.0000 mL | Freq: Two times a day (BID) | INTRAVENOUS | Status: DC
  Administered 2017-03-28: 3 mL via INTRAVENOUS

## 2017-03-28 MED ORDER — STROKE: EARLY STAGES OF RECOVERY BOOK
Freq: Once | Status: AC
  Administered 2017-03-28: 05:00:00

## 2017-03-28 MED ORDER — PANTOPRAZOLE SODIUM 40 MG PO TBEC
40.0000 mg | DELAYED_RELEASE_TABLET | Freq: Every day | ORAL | Status: DC
  Administered 2017-03-28: 40 mg via ORAL
  Filled 2017-03-28: qty 1

## 2017-03-28 MED ORDER — ACETAMINOPHEN 160 MG/5ML PO SOLN
650.0000 mg | ORAL | Status: DC | PRN
  Filled 2017-03-28: qty 20.3

## 2017-03-28 MED ORDER — SENNOSIDES-DOCUSATE SODIUM 8.6-50 MG PO TABS: 1.0000 | ORAL_TABLET | Freq: Every evening | ORAL | Status: DC | PRN

## 2017-03-28 MED ORDER — ACETAMINOPHEN 650 MG RE SUPP: 650.0000 mg | RECTAL | Status: DC | PRN

## 2017-03-28 MED ORDER — ENOXAPARIN SODIUM 40 MG/0.4ML ~~LOC~~ SOLN
40.0000 mg | SUBCUTANEOUS | Status: DC
  Administered 2017-03-28: 40 mg via SUBCUTANEOUS
  Filled 2017-03-28: qty 0.4

## 2017-03-28 MED ORDER — SODIUM CHLORIDE 0.9 % IV SOLN
INTRAVENOUS | Status: DC
  Administered 2017-03-28: 05:00:00 via INTRAVENOUS

## 2017-03-28 MED ORDER — ASPIRIN 300 MG RE SUPP: 300.0000 mg | Freq: Every day | RECTAL | Status: DC

## 2017-03-28 MED ORDER — ASPIRIN 325 MG PO TABS
325.0000 mg | ORAL_TABLET | Freq: Every day | ORAL | Status: DC
  Administered 2017-03-28: 325 mg via ORAL
  Filled 2017-03-28: qty 1

## 2017-03-28 MED ORDER — SODIUM CHLORIDE 0.9 % IV BOLUS (SEPSIS)
500.0000 mL | INTRAVENOUS | Status: AC
  Administered 2017-03-28: 500 mL via INTRAVENOUS

## 2017-03-28 MED ORDER — TORSEMIDE 20 MG PO TABS: 20.0000 mg | ORAL_TABLET | Freq: Every day | ORAL | 6 refills | Status: DC

## 2017-03-28 MED ORDER — OXYBUTYNIN CHLORIDE 5 MG PO TABS
5.0000 mg | ORAL_TABLET | Freq: Two times a day (BID) | ORAL | Status: DC
  Administered 2017-03-28: 5 mg via ORAL
  Filled 2017-03-28: qty 1

## 2017-03-28 MED ORDER — ISOSORBIDE MONONITRATE ER 30 MG PO TB24
60.0000 mg | ORAL_TABLET | Freq: Two times a day (BID) | ORAL | Status: DC
  Administered 2017-03-28: 60 mg via ORAL
  Filled 2017-03-28: qty 2

## 2017-03-28 NOTE — ED Notes (Signed)
Pt transport to 109 

## 2017-03-28 NOTE — H&P (Signed)
[facility] [place] NAME: [name]    MR#:  [id number]  DATE OF BIRTH:  Aug 20, 1946  DATE OF ADMISSION:  03/28/2017  PRIMARY CARE PHYSICIAN: Denton Lank, MD   REQUESTING/REFERRING PHYSICIAN:   CHIEF COMPLAINT:   Chief Complaint  Patient presents with  . Chest Pain  . Weakness    HISTORY OF PRESENT ILLNESS: [name]  is a 71 y.o. female with a known history of Breast cancer, congestive heart failure, GERD, hypertension, neuropathy, sleep apnea presented to the emergency room with chest discomfort and slurred speech. Patient had probable speaking and expressing words 6 hours prior to presentation to the emergency room. The speech improved after presentation to the emergency room .She also had some numbness in the lower extremities. No weakness in any part of the body. Also complains of chest pain which started yesterday sharp in nature 5 out of 10 on a scale of 1-10. Pain is located left-sided chest. Patient was evaluated in the emergency room she was out of the window period for thrombolytic therapy. First set of troponin was negative. CT head showed no acute intracranial abnormality. No history of prior stroke. Hospitalist service was consulted for further care of the patient.  PAST MEDICAL HISTORY:   Past Medical History:  Diagnosis Date  . Abnormality of gait   . Breast cancer ([place]) 2006   LT LUMPECTOMY  . CHF (congestive heart failure) ([place])    1) echo 07/2011: EF 20-25%, mild concentric hypertrophy, diffuse HK, regional WMA cannot be excluded, grade 1 DD, mitral valve with mild regurg, LA mildly dilated). 2) EF 40-45%, mild AS,                    . Edema   . GERD (gastroesophageal reflux disease)   . Hypertension   . Hypokinesis    global, EF 35-45 % from Echo.  . Malignant neoplasm of breast (female), unspecified site    left  . Neuropathy   . Obesity   . Pain in joint, site unspecified   . Sleep apnea   .  Tinea pedis   . Unspecified hearing loss    bilateral    PAST SURGICAL HISTORY: Past Surgical History:  Procedure Laterality Date  . BREAST LUMPECTOMY  2006   left breast   . CATARACT EXTRACTION     left eye  . HERNIA REPAIR    . TUBAL LIGATION      SOCIAL HISTORY:  Social History  Substance Use Topics  . Smoking status: Former Smoker    Packs/day: 1.00    Years: 30.00    Types: Cigarettes    Quit date: 07/14/2000  . Smokeless tobacco: Never Used  . Alcohol use No    FAMILY HISTORY:  Family History  Problem Relation Age of Onset  . Hypertension Brother   . Breast cancer Sister     DRUG ALLERGIES: No Known Allergies  REVIEW OF SYSTEMS:   CONSTITUTIONAL: No fever, fatigue or weakness.  EYES: No blurred or double vision.  EARS, NOSE, AND THROAT: No tinnitus or ear pain.  RESPIRATORY: No cough, shortness of breath, wheezing or hemoptysis.  CARDIOVASCULAR: No chest pain, orthopnea, edema.  GASTROINTESTINAL: No nausea, vomiting, diarrhea or abdominal pain.  GENITOURINARY: No dysuria, hematuria.  ENDOCRINE: No polyuria, nocturia,  HEMATOLOGY: No anemia, easy bruising or bleeding SKIN: No rash or lesion. MUSCULOSKELETAL: No joint pain or arthritis.   NEUROLOGIC: No tingling, weakness. Had slurred  speech Has numbness in lower extremities.  PSYCHIATRY: No anxiety or depression.   MEDICATIONS AT HOME:  Prior to Admission medications   Medication Sig Start Date End Date Taking? Authorizing Provider  aspirin EC 81 MG tablet Take 81 mg by mouth daily.     Yes [provider]  cloNIDine (CATAPRES) 0.2 MG tablet Take 1 tablet (0.2 mg total) by mouth 2 (two) times daily. 06/27/16  Yes Minna Merritts, MD  enalapril (VASOTEC) 10 MG tablet Take 10 mg by mouth daily.   Yes [provider]  hydrALAZINE (APRESOLINE) 50 MG tablet Take 2 tablets (100 mg total) by mouth 3 (three) times daily. 03/12/17 03/12/18 Yes Gollan, Kathlene November, MD  isosorbide mononitrate  (IMDUR) 60 MG 24 hr tablet Take 1 tablet (60 mg total) by mouth 2 (two) times daily. 04/20/13 03/28/17 Yes Gollan, Kathlene November, MD  omeprazole (PRILOSEC) 20 MG capsule Take 20 mg by mouth daily.   Yes [provider]  oxybutynin (DITROPAN) 5 MG tablet Take 5 mg by mouth 2 (two) times daily.   Yes [provider]  pantoprazole (PROTONIX) 40 MG tablet Take 40 mg by mouth daily. 02/18/16  Yes [provider]  potassium chloride SA (K-DUR,KLOR-CON) 20 MEQ tablet Take 1 tablet (20 mEq total) by mouth 2 (two) times daily. 03/12/17  Yes Minna Merritts, MD  torsemide (DEMADEX) 20 MG tablet Take 2 tablets (40 mg total) by mouth 2 (two) times daily. 03/12/17 07/10/17 Yes Gollan, Kathlene November, MD      PHYSICAL EXAMINATION:   VITAL SIGNS: Blood pressure 110/67, pulse 66, temperature 98 F (36.7 C), temperature source Oral, resp. rate 14, height 5\' 9"  (1.753 m), weight 113.4 kg (249 lb 14.4 oz), SpO2 98 %.  GENERAL:  71 y.o.-year-old patient lying in the bed with no acute distress.  EYES: Pupils equal, round, reactive to light and accommodation. No scleral icterus. Extraocular muscles intact.  HEENT: Head atraumatic, normocephalic. Oropharynx and nasopharynx clear.  NECK:  Supple, no jugular venous distention. No thyroid enlargement, no tenderness.  LUNGS: Normal breath sounds bilaterally, no wheezing, rales,rhonchi or crepitation. No use of accessory muscles of respiration.  CARDIOVASCULAR: S1, S2 normal. No murmurs, rubs, or gallops.  ABDOMEN: Soft, nontender, nondistended. Bowel sounds present. No organomegaly or mass.  EXTREMITIES: No pedal edema, cyanosis, or clubbing.  NEUROLOGIC: Cranial nerves II through XII are intact. Muscle strength 5/5 in all extremities. Sensation intact. Gait not checked.  PSYCHIATRIC: The patient is alert and oriented x 3.  SKIN: No obvious rash, lesion, or ulcer.   LABORATORY PANEL:   CBC  Recent Labs Lab 03/28/17 0112  WBC 9.2  HGB 12.5  HCT  36.9  PLT 275  MCV 82.0  MCH 27.8  MCHC 33.9  RDW 14.4  LYMPHSABS 1.9  MONOABS 1.1*  EOSABS 0.1  BASOSABS 0.1   ------------------------------------------------------------------------------------------------------------------  Chemistries   Recent Labs Lab 03/28/17 0112  NA 138  K 4.1  CL 100*  CO2 31  GLUCOSE 123*  BUN 38*  CREATININE 1.58*  CALCIUM 8.9  AST 17  ALT 10*  ALKPHOS 55  BILITOT 0.6   ------------------------------------------------------------------------------------------------------------------ estimated creatinine clearance is 43.9 mL/min (A) (by C-G formula based on SCr of 1.58 mg/dL (H)). ------------------------------------------------------------------------------------------------------------------ No results for input(s): TSH, T4TOTAL, T3FREE, THYROIDAB in the last 72 hours.  Invalid input(s): FREET3   Coagulation profile  Recent Labs Lab 03/28/17 0112  INR 1.07   ------------------------------------------------------------------------------------------------------------------- No results for input(s): DDIMER in the last 72  hours. -------------------------------------------------------------------------------------------------------------------  Cardiac Enzymes  Recent Labs Lab 03/28/17 0112  TROPONINI <0.03   ------------------------------------------------------------------------------------------------------------------ Invalid input(s): POCBNP  ---------------------------------------------------------------------------------------------------------------  Urinalysis    Component Value Date/Time   COLORURINE Yellow 03/22/2014 1620   APPEARANCEUR Clear 03/22/2014 1620   LABSPEC 1.020 03/22/2014 1620   PHURINE 5.0 03/22/2014 1620   GLUCOSEU Negative 03/22/2014 1620   HGBUR Negative 03/22/2014 1620   BILIRUBINUR Negative 03/22/2014 1620   KETONESUR Negative 03/22/2014 1620   PROTEINUR Negative 03/22/2014 1620   NITRITE  Negative 03/22/2014 1620   LEUKOCYTESUR Negative 03/22/2014 1620     RADIOLOGY: Ct Head Wo Contrast  Result Date: 03/28/2017 CLINICAL DATA:  Chest pain and weakness with difficulty speaking and swallowing. EXAM: CT HEAD WITHOUT CONTRAST TECHNIQUE: Contiguous axial images were obtained from the base of the skull through the vertex without intravenous contrast. COMPARISON:  07/06/2016 FINDINGS: Brain: No evidence of acute infarction, hemorrhage, hydrocephalus, extra-axial collection or mass lesion/mass effect. Minimal small vessel ischemic disease of periventricular white matter. Vascular: Bilateral carotid siphon calcifications. No hyperdense vessels. Skull: No calvarial fracture or focal lesions. Sinuses/Orbits: Secretions in the left sphenoid. No acute sinusitis. Clear mastoids. Intact orbits and globes. Other: None IMPRESSION: No acute intracranial abnormality. 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 Secretions again noted in the left sphenoid sinus. Electronically Signed   By: Ashley Royalty M.D.   On: 03/28/2017 02:20    EKG: Orders placed or performed during the hospital encounter of 03/28/17  . ED EKG  . ED EKG    IMPRESSION AND PLAN: 71 year old female patient with history of breast cancer, congestive heart failure, neuropathy, hypertension, GERD presented to the emergency room with slurred speech and chest discomfort. Admitting diagnosis 1. Transient ischemic attack 2. Chest pain 3. Hypertension 4. Congestive heart failure 5. History of breast cancer Treatment plan Admit patient to telemetry observation bed Aspirin 325 MG daily Cycle troponin Check echocardiogram Carotid ultrasound and MRI and MRA brain Neurology consultation Telemetry monitoring  All the records are reviewed and case discussed with ED provider. Management plans discussed with the patient, family and they are in agreement.  CODE STATUS:FULL  CODE Surrogate decision maker : Daughter Code Status History    This patient does not have a recorded code status. Please follow your organizational policy for patients in this situation.    Advance Directive Documentation     Most Recent Value  Type of Advance Directive  Healthcare Power of Attorney  Pre-existing out of facility DNR order (yellow form or pink MOST form)  -  "MOST" Form in Place?  -       TOTAL TIME TAKING CARE OF THIS PATIENT: 52 minutes.    Saundra Shelling M.D on 03/28/2017 at 3:37 AM  Between 7am to 6pm - Pager - [phone]  After 6pm go to [email or web address] - password [email or web address] [facility]  [facility]  Office  [phone]  CC: Primary care physician; Denton Lank, MD

## 2017-03-28 NOTE — Evaluation (Signed)
Occupational Therapy Evaluation Patient Details Name: [name] MRN: [id number] DOB: 07-27-1946 Today's Date: 03/28/2017    History of Present Illness 71 y.o. female presented to ED on 7/21 compaining of chest discomfort and slurred speech. PMHX significant for breast cancer, congestive heart failure, GERD, hypertension, neuropathy, and sleep apnea. Speech improved after presentation to the ED. Some numbness noted in the lower extremities, no weakness in any part of the body. Also complains of chest pain which started yesterday sharp in nature 5 out of 10 on a scale of 1-10. Pain is located left-sided chest. CT head showed no acute intracranial abnormality. No history of prior stroke. MRI negative for acute infarct.   Clinical Impression   Pt seen for brief OT evaluation this date, as pt needed to be taken down for echo testing prior to assessing functional mobility. Pt reports using rollator or SPC at baseline, living with daughter (who also uses a rollator after recent procedure) and grandaughter (granddaughter prepares meals and does cleaning tasks). Pt endorses 1 fall in past 12 months, typically able to drive, and enjoys books, puzzles, collecting. Pt presents with discomfort in both knees due to existing arthritis, occasional pain in R 5th toe that shoots up RLE, and denies any dizziness, double vision, or nausea. Pt generally weak bilaterally, R>L. Rapid alternating movement, finger to nose and sensation testing intact. Pt alert and oriented. Difficult to fully assess vision in functional context due to brief session, will continue to assess. Pt reports cataracts at baseline. Pt notes that she feels her speech is still slightly slurred, and not back to baseline. Difficult to determine discharge recommendations at this time due to unable to assess functional mobility at this time. Will continue to assess next session.     Follow Up Recommendations  Other (comment) (to assess next session)     Equipment Recommendations  Other (comment) (to assess next session)    Recommendations for Other Services       Precautions / Restrictions Precautions Precautions: Fall Restrictions Weight Bearing Restrictions: No      Mobility Bed Mobility               General bed mobility comments: deferred due to staff coming to take pt for echo testing  Transfers                 General transfer comment: deferred due to staff coming to take pt for echo testing    Balance Overall balance assessment:  (deferred due to staff coming to take pt for echo testing)                                         ADL either performed or assessed with clinical judgement   ADL Overall ADL's : Needs assistance/impaired Eating/Feeding: Bed level;Set up   Grooming: Bed level;Set up   Upper Body Bathing: Minimal assistance;Bed level   Lower Body Bathing: Bed level;Maximal assistance   Upper Body Dressing : Bed level;Minimal assistance   Lower Body Dressing: Bed level;Maximal assistance     Toilet Transfer Details (indicate cue type and reason): deferred due to staff coming to take pt for echo testing           General ADL Comments: pt generally max assist for LB ADL, R side slightly weaker than L     Vision Baseline Vision/History: Cataracts (pt notes she was told to get  inexpensive reading glasses from any store) Patient Visual Report: No change from baseline Vision Assessment?: Yes Eye Alignment: Within Functional Limits Ocular Range of Motion: Within Functional Limits Alignment/Gaze Preference: Within Defined Limits Tracking/Visual Pursuits: Able to track stimulus in all quads without difficulty Convergence: Impaired - to be further tested in functional context Visual Fields: No apparent deficits Additional Comments: continue to assess within functional context; visual testing disrupted by need to take pt to echo testing     Perception     Praxis       Pertinent Vitals/Pain Pain Assessment: Faces Faces Pain Scale: Hurts even more Pain Location: bilateral knees 2:2 arthritis (not new) Pain Descriptors / Indicators: Aching Pain Intervention(s): Limited activity within patient's tolerance;Monitored during session     Hand Dominance Right   Extremity/Trunk Assessment Upper Extremity Assessment Upper Extremity Assessment: Generalized weakness (ROM WFL bilat; LUE grossly 4-/5, RUE grossly 3+/5, R grip strength weaker than L; decreased speed with rapid alternativing movements, finger to nose testing normal, intact sensation)   Lower Extremity Assessment Lower Extremity Assessment: Defer to PT evaluation;Generalized weakness (at least 2+/5, RLE weaker than LLE; pt notes some neuropathy in R 5th toe that shoots up leg randomly)       Communication Communication Communication: Other (comment) (poor dentition, very slight slurred speech)   Cognition Arousal/Alertness: Awake/alert Behavior During Therapy: WFL for tasks assessed/performed Overall Cognitive Status: Within Functional Limits for tasks assessed                                 General Comments: alert and oriented, follows simple commands consistently   General Comments       Exercises     Shoulder Instructions      Home Living Family/patient expects to be discharged to:: Private residence Living Arrangements: Children;Other relatives (daughter and 41yo granddaughter) Available Help at Discharge: Family;Available 24 hours/day;Available PRN/intermittently (daughter uses rollator) Type of Home: Apartment (1st floor apt) Home Access: Level entry     Home Layout: One level     Bathroom Shower/Tub: Teacher, early years/pre: Standard Bathroom Accessibility: Yes How Accessible: Accessible via walker Home Equipment: [place] - 4 wheels;Cane - single point          Prior Functioning/Environment Level of Independence: Needs assistance  Gait  / Transfers Assistance Needed: pt ambulating with SPC or rollator at baseline  ADL's / Homemaking Assistance Needed: indep with basic self care; grandaughter assists with all cooking/cleaning tasks; endorses sometimes forgetting to take medications "but my daughter gives them to me."   Comments: endorses 1 fall in past 12 months        OT Problem List: Decreased strength;Decreased range of motion;Decreased activity tolerance;Decreased safety awareness;Impaired vision/perception      OT Treatment/Interventions: Self-care/ADL training;Therapeutic exercise;Therapeutic activities;Energy conservation;DME and/or AE instruction;Patient/family education;Visual/perceptual remediation/compensation    OT Goals(Current goals can be found in the care plan section) Acute Rehab OT Goals Patient Stated Goal: get stronger OT Goal Formulation: With patient Time For Goal Achievement: 04/11/17 Potential to Achieve Goals: Good  OT Frequency: Min 2X/week   Barriers to D/C:            Co-evaluation              AM-PAC PT "6 Clicks" Daily Activity     Outcome Measure Help from another person eating meals?: None Help from another person taking care of personal grooming?: None Help from another person  toileting, which includes using toliet, bedpan, or urinal?: A Lot Help from another person bathing (including washing, rinsing, drying)?: A Lot Help from another person to put on and taking off regular upper body clothing?: A Little Help from another person to put on and taking off regular lower body clothing?: A Lot 6 Click Score: 17   End of Session    Activity Tolerance: Patient tolerated treatment well Patient left: in bed;with call bell/phone within reach;with bed alarm set;with nursing/sitter in room (nursing in room to take pt for echo testing)  OT Visit Diagnosis: Other abnormalities of gait and mobility (R26.89);History of falling (Z91.81);Muscle weakness (generalized) (M62.81)                 Time: 3220-2542 OT Time Calculation (min): 16 min Charges:  OT General Charges $OT Visit: 1 Procedure OT Evaluation $OT Eval Low Complexity: 1 Procedure G-Codes: OT G-codes **NOT FOR INPATIENT CLASS** Functional Assessment Tool Used: AM-PAC 6 Clicks Daily Activity;Clinical judgement Functional Limitation: Self care Self Care Current Status (H0623): At least 40 percent but less than 60 percent impaired, limited or restricted Self Care Goal Status (J6283): At least 20 percent but less than 40 percent impaired, limited or restricted   Jeni Salles, MPH, MS, OTR/L ascom [phone] 03/28/17, 11:13 AM

## 2017-03-28 NOTE — Progress Notes (Signed)
OT Cancellation Note  Patient Details Name: [name] MRN: [id number] DOB: 06-17-1946   Cancelled Treatment:    Reason Eval/Treat Not Completed: Patient at procedure or test/ unavailable. Order received, chart reviewed. Pt out of room for testing at this time. Will re-attempt OT evaluation at later date/time as pt is available.  Jeni Salles, MPH, MS, OTR/L ascom [phone] 03/28/17, 8:43 AM

## 2017-03-28 NOTE — Evaluation (Signed)
Clinical/Bedside Swallow Evaluation Patient Details  Name: [name] MRN: [id number] Date of Birth: 12/28/1945  Today's Date: 03/28/2017 Time: SLP Start Time (ACUTE ONLY): 1225 SLP Stop Time (ACUTE ONLY): 1300 SLP Time Calculation (min) (ACUTE ONLY): 35 min  Past Medical History:  Past Medical History:  Diagnosis Date  . Abnormality of gait   . Breast cancer ([facility]) 2006   LT LUMPECTOMY  . CHF (congestive heart failure) ([facility])    1) echo 07/2011: EF 20-25%, mild concentric hypertrophy, diffuse HK, regional WMA cannot be excluded, grade 1 DD, mitral valve with mild regurg, LA mildly dilated). 2) EF 40-45%, mild AS,                    . Edema   . GERD (gastroesophageal reflux disease)   . Hypertension   . Hypokinesis    global, EF 35-45 % from Echo.  . Malignant neoplasm of breast (female), unspecified site    left  . Neuropathy   . Obesity   . Pain in joint, site unspecified   . Sleep apnea   . Tinea pedis   . Unspecified hearing loss    bilateral   Past Surgical History:  Past Surgical History:  Procedure Laterality Date  . BREAST LUMPECTOMY  2006   left breast   . CATARACT EXTRACTION     left eye  . HERNIA REPAIR    . TUBAL LIGATION     HPI:  [name]  is a 71 y.o. female with a known history of Breast cancer, congestive heart failure, GERD, hypertension, neuropathy, sleep apnea presented to the emergency room with chest discomfort and slurred speech. Patient had problems speaking and expressing words 6 hours prior to presentation to the emergency room. The speech improved after presentation to the emergency room .She also had some numbness in the lower extremities. No weakness in any part of the body. Also complains of chest pain which started yesterday sharp in nature 5 out of 10 on a scale of 1-10. Pain is located left-sided chest. Patient was evaluated in the emergency room she was out of the window period for thrombolytic therapy. First set of troponin was  negative. CT head showed no acute intracranial abnormality. No history of prior stroke.  Pt states that her speech is almost baseline but that she has been having difficulty swallowing.    Assessment / Plan / Recommendation Clinical Impression  This 71 y/o female presents with moderate oral dysphagia with solid consistencies. Pt given trials of thin, puree, mechanical soft and solid consistencies. Pt demsonstrated no overt s/s of aspiration w/any tested consistencies. Vocal quality remained clear throughout trials. Pt did c/o globus sensation with solid consistencies which decreased with a f/u sip of thin. Pt has a h/o GERD and noted decrased mastication of solids which could also contribute to a globus sensation. Pt demonstrated impaired mastication of solid consistency d/t pt is edentulous and needed f/u sip of thin to clear. Pt also given a trial of mechanical soft which she demonstrated improved mastication of, but still expectorated a small piece that she was unable to chew. Oral mech exam revealed oral motor abilities to be mostly [facility], although pt is edentulous. Pt is judged to be a mild-moderate risk of aspiration d/t oral dysphagia, however modifying diet will decrease aspiration risks. Recommend Dysphagia II diet w/thin liquids and aspiration precautions. Will f/u re: toleration of diet in 1-2 days.  SLP Visit Diagnosis: Dysphagia, oral phase (R13.11)    Aspiration  Risk  Mild aspiration risk    Diet Recommendation Dysphagia 2 (Fine chop);Thin liquid   Liquid Administration via: Cup Medication Administration: Whole meds with puree Supervision: Patient able to self feed Compensations: Minimize environmental distractions;Slow rate;Small sips/bites Postural Changes: Seated upright at 90 degrees;Remain upright for at least 30 minutes after po intake    Other  Recommendations Oral Care Recommendations: Oral care BID;Patient independent with oral care   Follow up Recommendations None       Frequency and Duration min 2x/week  1 week       Prognosis Prognosis for Safe Diet Advancement: Good      Swallow Study   General Date of Onset: 03/28/17 HPI: [name]  is a 71 y.o. female with a known history of Breast cancer, congestive heart failure, GERD, hypertension, neuropathy, sleep apnea presented to the emergency room with chest discomfort and slurred speech. Patient had problems speaking and expressing words 6 hours prior to presentation to the emergency room. The speech improved after presentation to the emergency room .She also had some numbness in the lower extremities. No weakness in any part of the body. Also complains of chest pain which started yesterday sharp in nature 5 out of 10 on a scale of 1-10. Pain is located left-sided chest. Patient was evaluated in the emergency room she was out of the window period for thrombolytic therapy. First set of troponin was negative. CT head showed no acute intracranial abnormality. No history of prior stroke.  Pt states that her speech is almost baseline but that she has been having difficulty swallowing.  Type of Study: Bedside Swallow Evaluation Previous Swallow Assessment: none reported Diet Prior to this Study: Regular;Thin liquids Temperature Spikes Noted: No Respiratory Status: Room air History of Recent Intubation: No Behavior/Cognition: Alert;Cooperative Oral Cavity Assessment: Within Functional Limits Oral Care Completed by SLP: No Oral Cavity - Dentition: Edentulous;Dentures, not available Vision: Functional for self-feeding Self-Feeding Abilities: Able to feed self Patient Positioning: Upright in bed Baseline Vocal Quality: Normal Volitional Swallow: Able to elicit    Oral/Motor/Sensory Function Overall Oral Motor/Sensory Function: Within functional limits   Ice Chips Ice chips: Not tested   Thin Liquid Thin Liquid: Within functional limits Presentation: Cup;Self Fed    Nectar Thick Nectar Thick Liquid: Not  tested   Honey Thick Honey Thick Liquid: Not tested   Puree Puree: Within functional limits Presentation: Spoon   Solid   GO   Solid: Impaired Oral Phase Impairments: Impaired mastication Oral Phase Functional Implications: Impaired mastication Pharyngeal Phase Impairments: Multiple swallows    Functional Assessment Tool Used: Clincial judgement Functional Limitations: Swallowing Swallow Current Status (W4315): At least 1 percent but less than 20 percent impaired, limited or restricted Swallow Goal Status [id number]): At least 1 percent but less than 20 percent impaired, limited or restricted   Dayton,Bonny Vanleeuwen 03/28/2017,1:31 PM

## 2017-03-28 NOTE — Consult Note (Signed)
Reason for Consult: slurry speech  Referring Physician: Dr. Anselm Jungling   CC: slurry speech.    HPI: [name] is an 71 y.o. female   with a known history of Breast cancer, congestive heart failure, GERD, hypertension, neuropathy, sleep apnea presented to the emergency room with chest discomfort and slurred speech. Pt has also been complaining of L chest pain which resolved.  States close to baseline right now.    Past Medical History:  Diagnosis Date  . Abnormality of gait   . Breast cancer ([place]) 2006   LT LUMPECTOMY  . CHF (congestive heart failure) ([place])    1) echo 07/2011: EF 20-25%, mild concentric hypertrophy, diffuse HK, regional WMA cannot be excluded, grade 1 DD, mitral valve with mild regurg, LA mildly dilated). 2) EF 40-45%, mild AS,                    . Edema   . GERD (gastroesophageal reflux disease)   . Hypertension   . Hypokinesis    global, EF 35-45 % from Echo.  . Malignant neoplasm of breast (female), unspecified site    left  . Neuropathy   . Obesity   . Pain in joint, site unspecified   . Sleep apnea   . Tinea pedis   . Unspecified hearing loss    bilateral    Past Surgical History:  Procedure Laterality Date  . BREAST LUMPECTOMY  2006   left breast   . CATARACT EXTRACTION     left eye  . HERNIA REPAIR    . TUBAL LIGATION      Family History  Problem Relation Age of Onset  . Hypertension Brother   . Breast cancer Sister     Social History:  reports that she quit smoking about 16 years ago. Her smoking use included Cigarettes. She has a 30.00 pack-year smoking history. She has never used smokeless tobacco. She reports that she does not drink alcohol or use drugs.  No Known Allergies  Medications: I have reviewed the patient's current medications.  ROS: History obtained from the patient  General ROS: negative for - chills, fatigue, fever, night sweats, weight gain or weight loss Psychological ROS: negative for - behavioral disorder,  hallucinations, memory difficulties, mood swings or suicidal ideation Ophthalmic ROS: negative for - blurry vision, double vision, eye pain or loss of vision ENT ROS: negative for - epistaxis, nasal discharge, oral lesions, sore throat, tinnitus or vertigo Allergy and Immunology ROS: negative for - hives or itchy/watery eyes Hematological and Lymphatic ROS: negative for - bleeding problems, bruising or swollen lymph nodes Endocrine ROS: negative for - galactorrhea, hair pattern changes, polydipsia/polyuria or temperature intolerance Respiratory ROS: negative for - cough, hemoptysis, shortness of breath or wheezing Cardiovascular ROS: negative for - chest pain, dyspnea on exertion, edema or irregular heartbeat Gastrointestinal ROS: negative for - abdominal pain, diarrhea, hematemesis, nausea/vomiting or stool incontinence Genito-Urinary ROS: negative for - dysuria, hematuria, incontinence or urinary frequency/urgency Musculoskeletal ROS: negative for - joint swelling or muscular weakness Neurological ROS: as noted in HPI Dermatological ROS: negative for rash and skin lesion changes  Physical Examination: Blood pressure (!) 120/51, pulse (!) 54, temperature 97.8 F (36.6 C), temperature source Oral, resp. rate 20, height 5\' 9"  (1.753 m), weight 114.2 kg (251 lb 11.2 oz), SpO2 100 %.  HEENT-  Normocephalic, no lesions, without obvious abnormality.  Normal external eye and conjunctiva.  Normal TM's bilaterally.  Normal auditory canals and external ears. Normal external nose,  mucus membranes and septum.  Normal pharynx. Cardiovascular- regular rate and rhythm, S1, S2 normal, no murmur, click, rub or gallop, pulses palpable throughout   Lungs- chest clear, no wheezing, rales, normal symmetric air entry, Heart exam - S1, S2 normal, no murmur, no gallop, rate regular Abdomen- soft, non-tender; bowel sounds normal; no masses,  no organomegaly Extremities- less then 2 second capillary refill Lymph-no  adenopathy palpable Musculoskeletal-no joint tenderness, deformity or swelling Skin-warm and dry, no hyperpigmentation, vitiligo, or suspicious lesions  Neurological Examination   Mental Status: Alert, oriented, thought content appropriate.  Speech fluent with some paraphrasing errors Cranial Nerves: II: Discs flat bilaterally; Visual fields grossly normal, pupils equal, round, reactive to light and accommodation III,IV, VI: ptosis not present, extra-ocular motions intact bilaterally V,VII: smile symmetric, facial light touch sensation normal bilaterally VIII: hearing normal bilaterally IX,X: gag reflex present XI: bilateral shoulder shrug XII: midline tongue extension Motor: Right : Upper extremity   4/5    Left:     Upper extremity   4/5  Lower extremity   3/5     Lower extremity   3/5 Tone and bulk:normal tone throughout; no atrophy noted Sensory: Pinprick and light touch intact throughout, bilaterally Deep Tendon Reflexes: 2+ and symmetric throughout Plantars: Right: downgoing   Left: downgoing Cerebellar: normal finger-to-nose, normal rapid alternating movements and normal heel-to-shin test Gait: not tested       Laboratory Studies:   Basic Metabolic Panel:  Recent Labs Lab 03/28/17 0112 03/28/17 0639  NA 138  --   K 4.1  --   CL 100*  --   CO2 31  --   GLUCOSE 123*  --   BUN 38*  --   CREATININE 1.58* 1.33*  CALCIUM 8.9  --     Liver Function Tests:  Recent Labs Lab 03/28/17 0112  AST 17  ALT 10*  ALKPHOS 55  BILITOT 0.6  PROT 7.5  ALBUMIN 3.7   No results for input(s): LIPASE, AMYLASE in the last 168 hours. No results for input(s): AMMONIA in the last 168 hours.  CBC:  Recent Labs Lab 03/28/17 0112 03/28/17 0639  WBC 9.2 7.2  NEUTROABS 6.0  --   HGB 12.5 11.9*  HCT 36.9 35.3  MCV 82.0 83.0  PLT 275 238    Cardiac Enzymes:  Recent Labs Lab 03/28/17 0112 03/28/17 0639  TROPONINI <0.03 <0.03    BNP: Invalid input(s):  POCBNP  CBG: No results for input(s): GLUCAP in the last 168 hours.  Microbiology: No results found for this or any previous visit.  Coagulation Studies:  Recent Labs  03/28/17 0112  LABPROT 13.9  INR 1.07    Urinalysis: No results for input(s): COLORURINE, LABSPEC, PHURINE, GLUCOSEU, HGBUR, BILIRUBINUR, KETONESUR, PROTEINUR, UROBILINOGEN, NITRITE, LEUKOCYTESUR in the last 168 hours.  Invalid input(s): APPERANCEUR  Lipid Panel:     Component Value Date/Time   CHOL 141 03/28/2017 0639   TRIG 40 03/28/2017 0639   HDL 42 03/28/2017 0639   CHOLHDL 3.4 03/28/2017 0639   VLDL 8 03/28/2017 0639   LDLCALC 91 03/28/2017 0639    HgbA1C: No results found for: HGBA1C  Urine Drug Screen:  No results found for: LABOPIA, COCAINSCRNUR, LABBENZ, AMPHETMU, THCU, LABBARB  Alcohol Level: No results for input(s): ETH in the last 168 hours.  Other results: EKG: normal EKG, normal sinus rhythm, unchanged from previous tracings.  Imaging: Ct Head Wo Contrast  Result Date: 03/28/2017 CLINICAL DATA:  Chest pain and weakness with difficulty speaking and  swallowing. EXAM: CT HEAD WITHOUT CONTRAST TECHNIQUE: Contiguous axial images were obtained from the base of the skull through the vertex without intravenous contrast. COMPARISON:  07/06/2016 FINDINGS: Brain: No evidence of acute infarction, hemorrhage, hydrocephalus, extra-axial collection or mass lesion/mass effect. Minimal small vessel ischemic disease of periventricular white matter. Vascular: Bilateral carotid siphon calcifications. No hyperdense vessels. Skull: No calvarial fracture or focal lesions. Sinuses/Orbits: Secretions in the left sphenoid. No acute sinusitis. Clear mastoids. Intact orbits and globes. Other: None IMPRESSION: No acute intracranial abnormality. 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 Secretions again noted in the left sphenoid sinus. Electronically Signed   By:  Ashley Royalty M.D.   On: 03/28/2017 02:20   Mr Brain Wo Contrast  Result Date: 03/28/2017 CLINICAL DATA:  TIA.  History breast cancer EXAM: MRI HEAD WITHOUT CONTRAST MRA HEAD WITHOUT CONTRAST TECHNIQUE: Multiplanar, multiecho pulse sequences of the brain and surrounding structures were obtained without intravenous contrast. Angiographic images of the head were obtained using MRA technique without contrast. COMPARISON:  CT head 03/28/2017 FINDINGS: MRI HEAD FINDINGS Brain: Cerebral volume and ventricle size normal. Negative for acute infarct. Mild chronic white matter changes bilaterally. Brainstem and cerebellum normal. Negative for hemorrhage or mass. Image quality degraded by patient motion. Vascular: Normal arterial flow void Skull and upper cervical spine: Negative Sinuses/Orbits: Air-fluid level in the sphenoid sinus. Remaining sinuses clear Other: None MRA HEAD FINDINGS Both vertebral arteries patent to the basilar without stenosis. Left PICA patent. Right PICA not visualized. AICA patent bilaterally. Superior cerebellar artery patent bilaterally. Irregularity of the posterior cerebral arteries bilaterally left greater than right due to atherosclerotic disease. Mild to moderate disease in the left posterior cerebral artery. Internal carotid artery patent bilaterally with mild atherosclerotic disease in the cavernous segment. Decreased signal in the left M1 segment which may be due to tortuosity versus atherosclerotic disease. This extends into the left middle cerebral artery branches. Right middle cerebral artery patent. Azygos variant of the A2 segment. Hypoplastic or stenotic right A1 segment. Negative for aneurysm IMPRESSION: Negative for acute infarct.  Mild chronic microvascular ischemia Mild irregularity of the posterior cerebral artery bilaterally and of the left M1 segment. Some of this may be due to vessel tortuosity versus atherosclerotic disease. No large vessel occlusion. Electronically Signed    By: Franchot Gallo M.D.   On: 03/28/2017 09:34   US Carotid Bilateral (at [facility] And Ap Only)  Result Date: 03/28/2017 CLINICAL DATA:  71 year old female with symptoms of transient ischemic attack EXAM: BILATERAL CAROTID DUPLEX ULTRASOUND TECHNIQUE: Pearline Cables scale imaging, color Doppler and duplex ultrasound were performed of bilateral carotid and vertebral arteries in the neck. COMPARISON:  Prior carotid duplex ultrasound 03/22/2014 FINDINGS: Criteria: Quantification of carotid stenosis is based on velocity parameters that correlate the residual internal carotid diameter with NASCET-based stenosis levels, using the diameter of the distal internal carotid lumen as the denominator for stenosis measurement. The following velocity measurements were obtained: RIGHT ICA:  71/11 cm/sec CCA:  025/85 cm/sec SYSTOLIC ICA/CCA RATIO:  0.6 DIASTOLIC ICA/CCA RATIO:  0.9 ECA:  114 cm/sec LEFT ICA:  114/14 cm/sec CCA:  277/82 cm/sec SYSTOLIC ICA/CCA RATIO:  0.9 DIASTOLIC ICA/CCA RATIO:  0.8 ECA:  82 cm/sec RIGHT CAROTID ARTERY: Trace heterogeneous atherosclerotic plaque in the carotid bifurcation. No evidence of internal carotid artery stenosis. RIGHT VERTEBRAL ARTERY:  Patent with normal antegrade flow. LEFT CAROTID ARTERY: Mild heterogeneous atherosclerotic plaque. No evidence of stenosis. The internal carotid artery is tortuous. LEFT VERTEBRAL ARTERY:  Patent with normal antegrade flow. IMPRESSION: Similar appearance of trace bilateral atherosclerotic plaque resulting in less than 50% diameter stenoses bilaterally compared to 03/22/2014. No interval progression. Vertebral arteries remain patent with antegrade flow. Signed, Criselda Peaches, MD [facility] Electronically Signed   By: Jacqulynn Cadet M.D.   On: 03/28/2017 09:29   Mr Jodene Nam Head/brain AV Cm  Result Date: 03/28/2017 CLINICAL DATA:  TIA.  History breast cancer EXAM: MRI HEAD WITHOUT CONTRAST MRA HEAD  WITHOUT CONTRAST TECHNIQUE: Multiplanar, multiecho pulse sequences of the brain and surrounding structures were obtained without intravenous contrast. Angiographic images of the head were obtained using MRA technique without contrast. COMPARISON:  CT head 03/28/2017 FINDINGS: MRI HEAD FINDINGS Brain: Cerebral volume and ventricle size normal. Negative for acute infarct. Mild chronic white matter changes bilaterally. Brainstem and cerebellum normal. Negative for hemorrhage or mass. Image quality degraded by patient motion. Vascular: Normal arterial flow void Skull and upper cervical spine: Negative Sinuses/Orbits: Air-fluid level in the sphenoid sinus. Remaining sinuses clear Other: None MRA HEAD FINDINGS Both vertebral arteries patent to the basilar without stenosis. Left PICA patent. Right PICA not visualized. AICA patent bilaterally. Superior cerebellar artery patent bilaterally. Irregularity of the posterior cerebral arteries bilaterally left greater than right due to atherosclerotic disease. Mild to moderate disease in the left posterior cerebral artery. Internal carotid artery patent bilaterally with mild atherosclerotic disease in the cavernous segment. Decreased signal in the left M1 segment which may be due to tortuosity versus atherosclerotic disease. This extends into the left middle cerebral artery branches. Right middle cerebral artery patent. Azygos variant of the A2 segment. Hypoplastic or stenotic right A1 segment. Negative for aneurysm IMPRESSION: Negative for acute infarct.  Mild chronic microvascular ischemia Mild irregularity of the posterior cerebral artery bilaterally and of the left M1 segment. Some of this may be due to vessel tortuosity versus atherosclerotic disease. No large vessel occlusion. Electronically Signed   By: Franchot Gallo M.D.   On: 03/28/2017 09:34     Assessment/Plan:  71 y.o. female   with a known history of Breast cancer, congestive heart failure, GERD, hypertension,  neuropathy, sleep apnea presented to the emergency room with chest discomfort and slurred speech. Pt has also been complaining of L chest pain which resolved.  States close to baseline right now.   - No abnormalities on MRI - anti platelet therapy - appreciate pt/ot - dc planning from Neuro stand point 03/28/2017, 12:12 PM

## 2017-03-28 NOTE — Discharge Summary (Addendum)
[facility] [place] NAME: [name]    MR#:  [id number]  DATE OF BIRTH:  [birth year]  DATE OF ADMISSION:  03/28/2017 ADMITTING PHYSICIAN: Saundra Shelling, MD  DATE OF DISCHARGE: 03/28/2017  PRIMARY CARE PHYSICIAN: Denton Lank, MD    ADMISSION DIAGNOSIS:  Transient cerebral ischemia, unspecified type [G45.9] Chest pain, unspecified type [R07.9]  DISCHARGE DIAGNOSIS:  Active Problems:   TIA (transient ischemic attack)    Chest pain.  SECONDARY DIAGNOSIS:   Past Medical History:  Diagnosis Date  . Abnormality of gait   . Breast cancer ([facility]) 2006   LT LUMPECTOMY  . CHF (congestive heart failure) ([facility])    1) echo 07/2011: EF 20-25%, mild concentric hypertrophy, diffuse HK, regional WMA cannot be excluded, grade 1 DD, mitral valve with mild regurg, LA mildly dilated). 2) EF 40-45%, mild AS,                    . Edema   . GERD (gastroesophageal reflux disease)   . Hypertension   . Hypokinesis    global, EF 35-45 % from Echo.  . Malignant neoplasm of breast (female), unspecified site    left  . Neuropathy   . Obesity   . Pain in joint, site unspecified   . Sleep apnea   . Tinea pedis   . Unspecified hearing loss    bilateral    HOSPITAL COURSE:   71 year old female patient with history of breast cancer, congestive heart failure, neuropathy, hypertension, GERD presented to the emergency room with slurred speech and chest discomfort.  1. Transient ischemic attack 2. Chest pain 3. Hypertension 4. Congestive heart failure 5. History of breast cancer Treatment plan Admitted patient to telemetry observation bed Aspirin 325 MG daily Cycle troponin- remained negative Checked echocardiogram done, no acute finding. Carotid ultrasound and MRI and MRA brain- without acute finding. Neurology consultation done, no new recommendations. Chest pain resolved. PT suggest SNF, pt have chronic numbness and weakness on legs,  walks with walker. I spoke to her daughter, she wants to take her home with PT.  DISCHARGE CONDITIONS:   Stable.  CONSULTS OBTAINED:  Treatment Team:  Leotis Pain, MD  DRUG ALLERGIES:  No Known Allergies  DISCHARGE MEDICATIONS:   Current Discharge Medication List    CONTINUE these medications which have CHANGED   Details  torsemide (DEMADEX) 20 MG tablet Take 1 tablet (20 mg total) by mouth daily. Qty: 120 tablet, Refills: 6      CONTINUE these medications which have NOT CHANGED   Details  aspirin EC 81 MG tablet Take 81 mg by mouth daily.      cloNIDine (CATAPRES) 0.2 MG tablet Take 1 tablet (0.2 mg total) by mouth 2 (two) times daily. Qty: 60 tablet, Refills: 11    isosorbide mononitrate (IMDUR) 60 MG 24 hr tablet Take 1 tablet (60 mg total) by mouth 2 (two) times daily. Qty: 180 tablet, Refills: 3    omeprazole (PRILOSEC) 20 MG capsule Take 20 mg by mouth daily.    oxybutynin (DITROPAN) 5 MG tablet Take 5 mg by mouth 2 (two) times daily.    potassium chloride SA (K-DUR,KLOR-CON) 20 MEQ tablet Take 1 tablet (20 mEq total) by mouth 2 (two) times daily. Qty: 60 tablet, Refills: 6      STOP taking these medications     enalapril (VASOTEC) 10 MG tablet      hydrALAZINE (APRESOLINE) 50 MG tablet  pantoprazole (PROTONIX) 40 MG tablet          DISCHARGE INSTRUCTIONS:    Follow with PMD in 1-2 weeks.  If you experience worsening of your admission symptoms, develop shortness of breath, life threatening emergency, suicidal or homicidal thoughts you must seek medical attention immediately by calling 911 or calling your MD immediately  if symptoms less severe.  You Must read complete instructions/literature along with all the possible adverse reactions/side effects for all the Medicines you take and that have been prescribed to you. Take any new Medicines after you have completely understood and accept all the possible adverse reactions/side effects.    Please note  You were cared for by a hospitalist during your hospital stay. If you have any questions about your discharge medications or the care you received while you were in the hospital after you are discharged, you can call the unit and asked to speak with the hospitalist on call if the hospitalist that took care of you is not available. Once you are discharged, your primary care physician will handle any further medical issues. Please note that NO REFILLS for any discharge medications will be authorized once you are discharged, as it is imperative that you return to your primary care physician (or establish a relationship with a primary care physician if you do not have one) for your aftercare needs so that they can reassess your need for medications and monitor your lab values.    Today   CHIEF COMPLAINT:   Chief Complaint  Patient presents with  . Chest Pain  . Weakness    HISTORY OF PRESENT ILLNESS:  [name]  is a 71 y.o. female with a known history of Breast cancer, congestive heart failure, GERD, hypertension, neuropathy, sleep apnea presented to the emergency room with chest discomfort and slurred speech. Patient had probable speaking and expressing words 6 hours prior to presentation to the emergency room. The speech improved after presentation to the emergency room .She also had some numbness in the lower extremities. No weakness in any part of the body. Also complains of chest pain which started yesterday sharp in nature 5 out of 10 on a scale of 1-10. Pain is located left-sided chest. Patient was evaluated in the emergency room she was out of the window period for thrombolytic therapy. First set of troponin was negative. CT head showed no acute intracranial abnormality. No history of prior stroke. Hospitalist service was consulted for further care of the patient.  VITAL SIGNS:  Blood pressure (!) 117/48, pulse 70, temperature 98.6 F (37 C), temperature source Oral,  resp. rate 20, height 5\' 9"  (1.753 m), weight 114.2 kg (251 lb 11.2 oz), SpO2 97 %.  I/O:    Intake/Output Summary (Last 24 hours) at 03/28/17 1624 Last data filed at 03/28/17 1403  Gross per 24 hour  Intake           321.83 ml  Output                0 ml  Net           321.83 ml    PHYSICAL EXAMINATION:   GENERAL:  71 y.o.-year-old patient lying in the bed with no acute distress.  EYES: Pupils equal, round, reactive to light and accommodation. No scleral icterus. Extraocular muscles intact.  HEENT: Head atraumatic, normocephalic. Oropharynx and nasopharynx clear.  NECK:  Supple, no jugular venous distention. No thyroid enlargement, no tenderness.  LUNGS: Normal breath sounds bilaterally, no wheezing,  rales,rhonchi or crepitation. No use of accessory muscles of respiration.  CARDIOVASCULAR: S1, S2 normal. No murmurs, rubs, or gallops.  ABDOMEN: Soft, nontender, nondistended. Bowel sounds present. No organomegaly or mass.  EXTREMITIES: No pedal edema, cyanosis, or clubbing.  NEUROLOGIC: Cranial nerves II through XII are intact. Muscle strength 5/5 in all extremities. Sensation intact. Gait not checked.  PSYCHIATRIC: The patient is alert and oriented x 3.  SKIN: No obvious rash, lesion, or ulcer.   DATA REVIEW:   CBC  Recent Labs Lab 03/28/17 0639  WBC 7.2  HGB 11.9*  HCT 35.3  PLT 238    Chemistries   Recent Labs Lab 03/28/17 0112 03/28/17 0639  NA 138  --   K 4.1  --   CL 100*  --   CO2 31  --   GLUCOSE 123*  --   BUN 38*  --   CREATININE 1.58* 1.33*  CALCIUM 8.9  --   AST 17  --   ALT 10*  --   ALKPHOS 55  --   BILITOT 0.6  --     Cardiac Enzymes  Recent Labs Lab 03/28/17 1345  TROPONINI <0.03    Microbiology Results  No results found for this or any previous visit.  RADIOLOGY:  Ct Head Wo Contrast  Result Date: 03/28/2017 CLINICAL DATA:  Chest pain and weakness with difficulty speaking and swallowing. EXAM: CT HEAD WITHOUT CONTRAST  TECHNIQUE: Contiguous axial images were obtained from the base of the skull through the vertex without intravenous contrast. COMPARISON:  07/06/2016 FINDINGS: Brain: No evidence of acute infarction, hemorrhage, hydrocephalus, extra-axial collection or mass lesion/mass effect. Minimal small vessel ischemic disease of periventricular white matter. Vascular: Bilateral carotid siphon calcifications. No hyperdense vessels. Skull: No calvarial fracture or focal lesions. Sinuses/Orbits: Secretions in the left sphenoid. No acute sinusitis. Clear mastoids. Intact orbits and globes. Other: None IMPRESSION: No acute intracranial abnormality. 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 Secretions again noted in the left sphenoid sinus. Electronically Signed   By: Ashley Royalty M.D.   On: 03/28/2017 02:20   Mr Brain Wo Contrast  Result Date: 03/28/2017 CLINICAL DATA:  TIA.  History breast cancer EXAM: MRI HEAD WITHOUT CONTRAST MRA HEAD WITHOUT CONTRAST TECHNIQUE: Multiplanar, multiecho pulse sequences of the brain and surrounding structures were obtained without intravenous contrast. Angiographic images of the head were obtained using MRA technique without contrast. COMPARISON:  CT head 03/28/2017 FINDINGS: MRI HEAD FINDINGS Brain: Cerebral volume and ventricle size normal. Negative for acute infarct. Mild chronic white matter changes bilaterally. Brainstem and cerebellum normal. Negative for hemorrhage or mass. Image quality degraded by patient motion. Vascular: Normal arterial flow void Skull and upper cervical spine: Negative Sinuses/Orbits: Air-fluid level in the sphenoid sinus. Remaining sinuses clear Other: None MRA HEAD FINDINGS Both vertebral arteries patent to the basilar without stenosis. Left PICA patent. Right PICA not visualized. AICA patent bilaterally. Superior cerebellar artery patent bilaterally. Irregularity of the posterior cerebral arteries  bilaterally left greater than right due to atherosclerotic disease. Mild to moderate disease in the left posterior cerebral artery. Internal carotid artery patent bilaterally with mild atherosclerotic disease in the cavernous segment. Decreased signal in the left M1 segment which may be due to tortuosity versus atherosclerotic disease. This extends into the left middle cerebral artery branches. Right middle cerebral artery patent. Azygos variant of the A2 segment. Hypoplastic or stenotic right A1 segment. Negative for aneurysm IMPRESSION: Negative for acute infarct.  Mild chronic microvascular ischemia  Mild irregularity of the posterior cerebral artery bilaterally and of the left M1 segment. Some of this may be due to vessel tortuosity versus atherosclerotic disease. No large vessel occlusion. Electronically Signed   By: Franchot Gallo M.D.   On: 03/28/2017 09:34   US Carotid Bilateral (at [facility] Only)  Result Date: 03/28/2017 CLINICAL DATA:  71 year old female with symptoms of transient ischemic attack EXAM: BILATERAL CAROTID DUPLEX ULTRASOUND TECHNIQUE: Pearline Cables scale imaging, color Doppler and duplex ultrasound were performed of bilateral carotid and vertebral arteries in the neck. COMPARISON:  Prior carotid duplex ultrasound 03/22/2014 FINDINGS: Criteria: Quantification of carotid stenosis is based on velocity parameters that correlate the residual internal carotid diameter with NASCET-based stenosis levels, using the diameter of the distal internal carotid lumen as the denominator for stenosis measurement. The following velocity measurements were obtained: RIGHT ICA:  71/11 cm/sec CCA:  782/95 cm/sec SYSTOLIC ICA/CCA RATIO:  0.6 DIASTOLIC ICA/CCA RATIO:  0.9 ECA:  114 cm/sec LEFT ICA:  114/14 cm/sec CCA:  621/30 cm/sec SYSTOLIC ICA/CCA RATIO:  0.9 DIASTOLIC ICA/CCA RATIO:  0.8 ECA:  82 cm/sec RIGHT CAROTID ARTERY: Trace heterogeneous atherosclerotic plaque in the carotid bifurcation. No evidence of  internal carotid artery stenosis. RIGHT VERTEBRAL ARTERY:  Patent with normal antegrade flow. LEFT CAROTID ARTERY: Mild heterogeneous atherosclerotic plaque. No evidence of stenosis. The internal carotid artery is tortuous. LEFT VERTEBRAL ARTERY:  Patent with normal antegrade flow. IMPRESSION: Similar appearance of trace bilateral atherosclerotic plaque resulting in less than 50% diameter stenoses bilaterally compared to 03/22/2014. No interval progression. Vertebral arteries remain patent with antegrade flow. Signed, Criselda Peaches, MD [facility] Electronically Signed   By: Jacqulynn Cadet M.D.   On: 03/28/2017 09:29   Mr Jodene Nam Head/brain QM Cm  Result Date: 03/28/2017 CLINICAL DATA:  TIA.  History breast cancer EXAM: MRI HEAD WITHOUT CONTRAST MRA HEAD WITHOUT CONTRAST TECHNIQUE: Multiplanar, multiecho pulse sequences of the brain and surrounding structures were obtained without intravenous contrast. Angiographic images of the head were obtained using MRA technique without contrast. COMPARISON:  CT head 03/28/2017 FINDINGS: MRI HEAD FINDINGS Brain: Cerebral volume and ventricle size normal. Negative for acute infarct. Mild chronic white matter changes bilaterally. Brainstem and cerebellum normal. Negative for hemorrhage or mass. Image quality degraded by patient motion. Vascular: Normal arterial flow void Skull and upper cervical spine: Negative Sinuses/Orbits: Air-fluid level in the sphenoid sinus. Remaining sinuses clear Other: None MRA HEAD FINDINGS Both vertebral arteries patent to the basilar without stenosis. Left PICA patent. Right PICA not visualized. AICA patent bilaterally. Superior cerebellar artery patent bilaterally. Irregularity of the posterior cerebral arteries bilaterally left greater than right due to atherosclerotic disease. Mild to moderate disease in the left posterior cerebral artery. Internal carotid artery patent  bilaterally with mild atherosclerotic disease in the cavernous segment. Decreased signal in the left M1 segment which may be due to tortuosity versus atherosclerotic disease. This extends into the left middle cerebral artery branches. Right middle cerebral artery patent. Azygos variant of the A2 segment. Hypoplastic or stenotic right A1 segment. Negative for aneurysm IMPRESSION: Negative for acute infarct.  Mild chronic microvascular ischemia Mild irregularity of the posterior cerebral artery bilaterally and of the left M1 segment. Some of this may be due to vessel tortuosity versus atherosclerotic disease. No large vessel occlusion. Electronically Signed   By: Franchot Gallo M.D.   On: 03/28/2017 09:34    EKG:   Orders placed or performed during the hospital encounter of 03/28/17  .  ED EKG  . ED EKG      Management plans discussed with the patient, family and they are in agreement.  CODE STATUS:     Code Status Orders        Start     Ordered   03/28/17 0418  Full code  Continuous     03/28/17 0418    Code Status History    Date Active Date Inactive Code Status Order ID Comments User Context   This patient has a current code status but no historical code status.    Advance Directive Documentation     Most Recent Value  Type of Advance Directive  Healthcare Power of Attorney  Pre-existing out of facility DNR order (yellow form or pink MOST form)  -  "MOST" Form in Place?  -      TOTAL TIME TAKING CARE OF THIS PATIENT: 35 minutes.    Vaughan Basta M.D on 03/28/2017 at 4:24 PM  Between 7am to 6pm - Pager - [phone]  After 6pm go to [email or web address] - password [email or web address] [facility]  Office  [phone]  CC: Primary care physician; Denton Lank, MD   Note: This dictation was prepared with Dragon dictation along with smaller phrase technology. Any transcriptional errors that result from this process are unintentional.

## 2017-03-28 NOTE — Progress Notes (Signed)
Stroke workup negative. Pt reports chronic right leg pain/weakness unchanged from baseline. Pt desires to go home with home PT services.

## 2017-03-28 NOTE — ED Notes (Signed)
MD Pyreddy at bedside.

## 2017-03-28 NOTE — Care Management Obs Status (Signed)
[facility] NOTIFICATION   Patient Details  Name: [name] MRN: [id number] Date of Birth: 1946-08-01   Medicare Observation Status Notification Given:  No [facility] letter not given per D/C within 24 hours)    Attallah Ontko A, RN 03/28/2017, 4:20 PM

## 2017-03-28 NOTE — ED Notes (Signed)
MD Pyreddy reported he did not want initial troponin drawn until 6 hours after last draw.  First troponin draw should therefore be at 0712. RN verified time with MD Pyreddy. MD ordered blood draw at 0712.

## 2017-03-28 NOTE — Progress Notes (Signed)
Discharge home with family/self care with [facility] referral. Pt transported to private vehicle in transport chair.

## 2017-03-28 NOTE — ED Triage Notes (Signed)
Patient presents to Emergency Department via  [facility] with complaints of CP and weakness with difficulty speaking and swallowing    CP: per EMS started 0040 with "spongy" tightness in chest, pt was given 4 x 81 ASA, and 2 x SL nitro spray, CP resolved after second spray  Weakness with difficulty speaking and swallowing noticed by daughter (per pt at approx 2030.    Pt rapid NIH reveals difficulty forming words and right side of smile slightly lower, symmetry at other facets

## 2017-03-28 NOTE — Progress Notes (Signed)
CH received an OR for PT wanting to have information on an AD. CH took information and explained it to PT. [place] prayed for PT. PT is being discharged today, [place] informed her to bring AD back and we can complete the process for her.   03/28/17 1630  Clinical Encounter Type  Visited With Patient and family together  Visit Type Initial  Referral From Nurse  Consult/Referral To Chaplain  Spiritual Encounters  Spiritual Needs Literature;Prayer

## 2017-03-28 NOTE — Progress Notes (Signed)
Red bracelet and yellow bracelet on.

## 2017-03-28 NOTE — Care Management Note (Signed)
Case Management Note  Patient Details  Name: [name] MRN: [id number] Date of Birth: [birth year]  Subjective/Objective:          A referral for home health PT was called to [facility].           Action/Plan:   Expected Discharge Date:  03/28/17               Expected Discharge Plan:     In-House Referral:     Discharge planning Services     Post Acute Care Choice:    Choice offered to:     DME Arranged:    DME Agency:     HH Arranged:    HH Agency:     Status of Service:     If discussed at [employer], dates discussed:    Additional Comments:  Cindra Austad A, RN 03/28/2017, 4:33 PM

## 2017-03-28 NOTE — Progress Notes (Signed)
SLP Cancellation Note  Patient Details Name: [name] MRN: [id number] DOB: 1946/07/15   Cancelled treatment:       Reason Eval/Treat Not Completed: Patient at procedure or test/unavailable Received orders and reviewed chart. Pt is currently out of the room for a test. Will re-attempt ST evaluation at a later time/date when pt is available.    [place],[name] 03/28/2017, 10:53 AM

## 2017-03-28 NOTE — Progress Notes (Signed)
Oral and written AVS instructions done with pt and son with stated understanding. Pt has spoken to case mgt for [facility] services. Desires to go home to self/care with [facility] services.

## 2017-03-28 NOTE — Progress Notes (Signed)
Physical Therapy Evaluation Patient Details Name: [name] MRN: [id number] DOB: 1946/05/05 Today's Date: 03/28/2017   History of Present Illness  Patient is a 71 y.o. female admitted on 27 JUL after experiencing increasing chest pain and slurred speech, diagnosed with TIA. PMH includes breast CA, CHF, GERD, HTN, neuropathy, and sleep apnea.  Clinical Impression  Patient is a pleasant female admitted for above listed reasons. Patient previously ambulating with SPC on R, or occasionally rollator. Patient admits to recently finishing HHPT on Friday but notes that she is still very limited by knee pain, R>L. On evaluation, patient demonstrates equal sensation in bilateral LEs; however, she is significantly weaker on her R LE which impacts her in all aspects of mobility. Patient ambulates at decreased cadence, showing decreased foot clearance, increased knee flexion, and increased trunk flexion. Patient admits to falls in past, secondary to impairments. Patient will benefit from skilled and progressive PT to improve muscle strength, ROM, balance, and functional mobility to prevent hospital readmissions in the future. Patient will benefit from f/u at [facility] upon discharge when medically ready.  Follow Up Recommendations SNF    Equipment Recommendations  Rolling walker with 5" wheels    Recommendations for Other Services       Precautions / Restrictions Precautions Precautions: Fall Restrictions Weight Bearing Restrictions: No      Mobility  Bed Mobility Overal bed mobility: Needs Assistance Bed Mobility: Supine to Sit;Sit to Supine     Supine to sit: Min assist Sit to supine: Min assist   General bed mobility comments: Patient moves from supine to sit and sit to supine with minimal assistance. Uses UEs to lift R LE.  Transfers Overall transfer level: Needs assistance Equipment used: Rolling walker (2 wheeled) Transfers: Sit to/from Stand Sit to Stand: Min assist          General transfer comment: Patient moves from sit to stand and stand to sit with decreased eccentric control. Keeps right knee flexed and demonstrates forward trunk lean.  Ambulation/Gait Ambulation/Gait assistance: Min assist Ambulation Distance (Feet): 30 Feet Assistive device: Rolling walker (2 wheeled)       General Gait Details: Patient ambulates 15' to bathroom and 15' back to bed, demonstrating decreased foot clearance on R, keeping R knee flexed, and trunk flexed.  Stairs            Wheelchair Mobility    Modified Rankin (Stroke Patients Only)       Balance Overall balance assessment: History of Falls;Needs assistance Sitting-balance support: Feet supported Sitting balance-Leahy Scale: Good     Standing balance support: Bilateral upper extremity supported Standing balance-Leahy Scale: Fair                               Pertinent Vitals/Pain Pain Assessment: Faces Faces Pain Scale: Hurts a little bit Pain Location: R hip Pain Descriptors / Indicators: Aching Pain Intervention(s): Limited activity within patient's tolerance;Monitored during session    Home Living Family/patient expects to be discharged to:: Private residence Living Arrangements: Children;Other relatives;Other (Comment) (Granddaughter) Available Help at Discharge: Family;Available 24 hours/day;Available PRN/intermittently Type of Home: Apartment Home Access: Level entry     Home Layout: One level Home Equipment: Walker - 4 wheels;Cane - single point      Prior Function Level of Independence: Needs assistance   Gait / Transfers Assistance Needed: Patient ambulating with SPC on R or rollator for short distances  ADL's / Homemaking Assistance Needed: Independent  with basic self care; granddaughter assists with all cooking/cleaning tasks  Comments: Patient utilizes power chair in store and doesn't transfer if feels like she can't. Admits to history of falling.     Hand  Dominance   Dominant Hand: Right    Extremity/Trunk Assessment   Upper Extremity Assessment Upper Extremity Assessment: Generalized weakness    Lower Extremity Assessment Lower Extremity Assessment: Generalized weakness;RLE deficits/detail RLE Deficits / Details: 3-/5 strength hip flexion, knee flexion; 2+/5 dorsiflexion/plantarflexion RLE: Unable to fully assess due to pain       Communication   Communication: Other (comment) (poor dentition, very slight slurred speech)  Cognition Arousal/Alertness: Lethargic Behavior During Therapy: WFL for tasks assessed/performed Overall Cognitive Status: Within Functional Limits for tasks assessed                                 General Comments: alert and oriented, follows simple commands consistently      General Comments      Exercises     Assessment/Plan    PT Assessment Patient needs continued PT services  PT Problem List Decreased range of motion;Decreased strength;Decreased activity tolerance;Decreased balance;Decreased mobility;Decreased knowledge of use of DME;Decreased safety awareness;Pain       PT Treatment Interventions DME instruction;Gait training;Functional mobility training;Therapeutic activities;Therapeutic exercise;Patient/family education;Balance training    PT Goals (Current goals can be found in the Care Plan section)  Acute Rehab PT Goals Patient Stated Goal: Get stronger PT Goal Formulation: With patient Time For Goal Achievement: 04/11/17 Potential to Achieve Goals: Good    Frequency Min 2X/week   Barriers to discharge Decreased caregiver support;Inaccessible home environment      Co-evaluation               AM-PAC PT "6 Clicks" Daily Activity  Outcome Measure Difficulty turning over in bed (including adjusting bedclothes, sheets and blankets)?: A Little Difficulty moving from lying on back to sitting on the side of the bed? : A Little Difficulty sitting down on and standing  up from a chair with arms (e.g., wheelchair, bedside commode, etc,.)?: A Lot Help needed moving to and from a bed to chair (including a wheelchair)?: A Lot Help needed walking in hospital room?: A Lot Help needed climbing 3-5 steps with a railing? : Total 6 Click Score: 13    End of Session Equipment Utilized During Treatment: Gait belt Activity Tolerance: Patient limited by fatigue;Patient limited by pain Patient left: in bed;with call bell/phone within reach;with bed alarm set   PT Visit Diagnosis: Muscle weakness (generalized) (M62.81);History of falling (Z91.81);Difficulty in walking, not elsewhere classified (R26.2);Pain Pain - Right/Left: Right Pain - part of body: Knee    Time: 1300-1330 PT Time Calculation (min) (ACUTE ONLY): 30 min   Charges:   PT Evaluation $PT Eval Low Complexity: 1 Procedure     PT G Codes:   PT G-Codes **NOT FOR INPATIENT CLASS** Functional Assessment Tool Used: AM-PAC 6 Clicks Basic Mobility;Clinical judgement Functional Limitation: Mobility: Walking and moving around Mobility: Walking and Moving Around Current Status (Q2297): At least 40 percent but less than 60 percent impaired, limited or restricted Mobility: Walking and Moving Around Goal Status [phone]): At least 20 percent but less than 40 percent impaired, limited or restricted      Dorice Lamas, PT, DPT 03/28/2017, 1:42 PM

## 2017-03-28 NOTE — ED Provider Notes (Signed)
[facility] Emergency Department Provider Note  ____________________________________________   First MD Initiated Contact with Patient 03/28/17 0122     (approximate)  I have reviewed the triage vital signs and the nursing notes.   HISTORY  Chief Complaint Chest Pain and Weakness    HPI [name] is a 71 y.o. female with extensive PMH as listed below who presents by EMS for evaluation of chest pain and difficulty speaking and finding words.  Reportedly the onset of these symptoms was acute and occurred approximately 5-5 and half hours prior to arrival.  States that she was sitting and not doing anything in particular when she felt some chest pressureand felt a right-sided headache.  She went outside to get some air and also felt some shortness of breath.  She states that she felt weird and her daughter checked on her and she was having difficulty finding words and speaking.  She also reports that it was difficult for her to swallow.  She states that all of these symptoms have improved but she still does not feel normal.  Her daughter noted to EMS that she had some right-sided facial droop, but that also seems to have improved.  She currently does not feel any of the chest pressure; it resolved after the second nitroglycerin spray by EMS.  She has received a full dose of aspirin (81 mg 4 tablets)  PTA by EMS.  Nothing in particular made the neuro symptoms feel better or worse but they did improve on their own.  She denies any focal weakness such as in her arms or legs but does state she feels weak all over  Denies recent illnesses.  She denies fever/chills, chest pain, shortness of breath, nausea or vomiting, abdominal pain.   Past Medical History:  Diagnosis Date  . Abnormality of gait   . Breast cancer ([place]) 2006   LT LUMPECTOMY  . CHF (congestive heart failure) ([place])    1) echo 07/2011: EF 20-25%, mild concentric hypertrophy, diffuse HK, regional WMA  cannot be excluded, grade 1 DD, mitral valve with mild regurg, LA mildly dilated). 2) EF 40-45%, mild AS,                    . Edema   . GERD (gastroesophageal reflux disease)   . Hypertension   . Hypokinesis    global, EF 35-45 % from Echo.  . Malignant neoplasm of breast (female), unspecified site    left  . Neuropathy   . Obesity   . Pain in joint, site unspecified   . Sleep apnea   . Tinea pedis   . Unspecified hearing loss    bilateral    Patient Active Problem List   Diagnosis Date Noted  . Urinary incontinence 06/27/2016  . Chronic systolic CHF (congestive heart failure) ([place]) 04/20/2013  . HTN (hypertension) 07/16/2011  . CHF (congestive heart failure) ([place]) 07/15/2011  . Shortness of breath 07/15/2011  . Edema 07/15/2011    Past Surgical History:  Procedure Laterality Date  . BREAST LUMPECTOMY  2006   left breast   . CATARACT EXTRACTION     left eye  . HERNIA REPAIR    . TUBAL LIGATION      Prior to Admission medications   Medication Sig Start Date End Date Taking? Authorizing Provider  aspirin EC 81 MG tablet Take 81 mg by mouth daily.     Yes [provider]  cloNIDine (CATAPRES) 0.2 MG tablet Take 1  tablet (0.2 mg total) by mouth 2 (two) times daily. 06/27/16  Yes Minna Merritts, MD  enalapril (VASOTEC) 10 MG tablet Take 10 mg by mouth daily.   Yes [provider]  hydrALAZINE (APRESOLINE) 50 MG tablet Take 2 tablets (100 mg total) by mouth 3 (three) times daily. 03/12/17 03/12/18 Yes Gollan, Kathlene November, MD  isosorbide mononitrate (IMDUR) 60 MG 24 hr tablet Take 1 tablet (60 mg total) by mouth 2 (two) times daily. 04/20/13 03/28/17 Yes Gollan, Kathlene November, MD  omeprazole (PRILOSEC) 20 MG capsule Take 20 mg by mouth daily.   Yes [provider]  oxybutynin (DITROPAN) 5 MG tablet Take 5 mg by mouth 2 (two) times daily.   Yes [provider]  pantoprazole (PROTONIX) 40 MG tablet Take 40 mg by mouth daily. 02/18/16  Yes [provider]  potassium chloride SA (K-DUR,KLOR-CON) 20 MEQ tablet Take 1 tablet (20 mEq total) by mouth 2 (two) times daily. 03/12/17  Yes Minna Merritts, MD  torsemide (DEMADEX) 20 MG tablet Take 2 tablets (40 mg total) by mouth 2 (two) times daily. 03/12/17 07/10/17 Yes Minna Merritts, MD    Allergies Patient has no known allergies.  Family History  Problem Relation Age of Onset  . Hypertension Brother   . Breast cancer Sister     Social History Social History  Substance Use Topics  . Smoking status: Former Smoker    Packs/day: 1.00    Years: 30.00    Types: Cigarettes    Quit date: 07/14/2000  . Smokeless tobacco: Never Used  . Alcohol use No    Review of Systems Constitutional: No fever/chills Eyes: No visual changes. ENT: No sore throat. Cardiovascular: + Chest pressure Respiratory: +shortness of breath. Gastrointestinal: No abdominal pain.  No nausea, no vomiting.  No diarrhea.  No constipation. Genitourinary: Negative for dysuria. Musculoskeletal: Negative for neck pain.  Negative for back pain. Integumentary: Negative for rash. Neurological: Generalized weakness with acute onset of difficulty finding words, speaking, and swallowing with probable facial droop.  All the symptoms have improved but the patient still feels weak.   ____________________________________________   PHYSICAL EXAM:  VITAL SIGNS: ED Triage Vitals  Enc Vitals Group     BP 03/28/17 0111 (!) 96/56     Pulse Rate 03/28/17 0111 81     Resp 03/28/17 0111 17     Temp 03/28/17 0111 97.7 F (36.5 C)     Temp Source 03/28/17 0111 Oral     SpO2 03/28/17 0102 97 %     Weight 03/28/17 0110 113.4 kg (249 lb 14.4 oz)     Height 03/28/17 0110 1.753 m (5\' 9" )     Head Circumference --      Peak Flow --      Pain Score --      Pain Loc --      Pain Edu? --      Excl. in [place]? --     Constitutional: Alert and oriented. No acute distress at the moment, nontoxic appearance Eyes: Conjunctivae  are normal. PERRL. EOMI. Head: Atraumatic. Nose: No congestion/rhinnorhea. Mouth/Throat: Edentulous.  Mucous membranes are moist. Neck: No stridor.  No meningeal signs.   Cardiovascular: Normal rate, regular rhythm. Good peripheral circulation. Grossly normal heart sounds. Respiratory: Normal respiratory effort.  No retractions. Lungs CTAB. Gastrointestinal: Soft and nontender. No distention.  Musculoskeletal: No lower extremity tenderness nor edema. No gross deformities of extremities. Neurologic:  See below for NIHSS.  Patient has  slightly asymmetrical smile with a slight droop on the right side.  No sensory deficits are appreciated.  She has normal grip strength bilaterally and normal major muscle strength in her arms and legs although her effort is minimal.  She is having some apparent word finding difficulties but no aphasia and no dysarthria although dysarthria was witnessed by her nurse initially upon arrival in the ED. Skin:  Skin is warm, dry and intact. No rash noted. Psychiatric: Mood and affect are normal. Speech and behavior are normal.  ____________________________________________   LABS (all labs ordered are listed, but only abnormal results are displayed)  Labs Reviewed  DIFFERENTIAL - Abnormal; Notable for the following:       Result Value   Monocytes Absolute 1.1 (*)    All other components within normal limits  COMPREHENSIVE METABOLIC PANEL - Abnormal; Notable for the following:    Chloride 100 (*)    Glucose, Bld 123 (*)    BUN 38 (*)    Creatinine, Ser 1.58 (*)    ALT 10 (*)    GFR calc non Af Amer 32 (*)    GFR calc Af Amer 37 (*)    All other components within normal limits  PROTIME-INR  APTT  CBC  TROPONIN I  CBG MONITORING, ED   ____________________________________________  EKG  ED ECG REPORT I, Tarhonda Hollenberg, the attending physician, personally viewed and interpreted this ECG.  Date: 03/28/2017 EKG Time: 01:08 Rate: 78 Rhythm: normal sinus  rhythm QRS Axis: RAD Intervals: normal w/ LVH ST/T Wave abnormalities: Non-specific ST segment / T-wave changes, but no evidence of acute ischemia.   ____________________________________________  RADIOLOGY   Ct Head Wo Contrast  Result Date: 03/28/2017 CLINICAL DATA:  Chest pain and weakness with difficulty speaking and swallowing. EXAM: CT HEAD WITHOUT CONTRAST TECHNIQUE: Contiguous axial images were obtained from the base of the skull through the vertex without intravenous contrast. COMPARISON:  07/06/2016 FINDINGS: Brain: No evidence of acute infarction, hemorrhage, hydrocephalus, extra-axial collection or mass lesion/mass effect. Minimal small vessel ischemic disease of periventricular white matter. Vascular: Bilateral carotid siphon calcifications. No hyperdense vessels. Skull: No calvarial fracture or focal lesions. Sinuses/Orbits: Secretions in the left sphenoid. No acute sinusitis. Clear mastoids. Intact orbits and globes. Other: None IMPRESSION: No acute intracranial abnormality. 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 Secretions again noted in the left sphenoid sinus. Electronically Signed   By: Ashley Royalty M.D.   On: 03/28/2017 02:20    ____________________________________________   PROCEDURES  Critical Care performed: No   Procedure(s) performed:   Procedures   ____________________________________________   INITIAL IMPRESSION / ASSESSMENT AND PLAN / ED COURSE  Pertinent labs & imaging results that were available during my care of the patient were reviewed by me and considered in my medical decision making (see chart for details).  NIH Stroke Scale  Interval: Baseline Time: 2:30 AM Person Administering Scale: Dyshaun Bonzo  Administer stroke scale items in the order listed. Record performance in each category after each subscale exam. Do not go back and change scores. Follow directions provided for  each exam technique. Scores should reflect what the patient does, not what the clinician thinks the patient can do. The clinician should record answers while administering the exam and work quickly. Except where indicated, the patient should not be coached (i.e., repeated requests to patient to make a special effort).   1a  Level of consciousness: 0=alert; keenly responsive  1b. LOC questions:  0=Performs  both tasks correctly  1c. LOC commands: 0=Performs both tasks correctly  2.  Best Gaze: 0=normal  3.  Visual: 0=No visual loss  4. Facial Palsy: 1=Minor paralysis (flattened nasolabial fold, asymmetric on smiling)  5a.  Motor left arm: 0=No drift, limb holds 90 (or 45) degrees for full 10 seconds  5b.  Motor right arm: 0=No drift, limb holds 90 (or 45) degrees for full 10 seconds  6a. motor left leg: 0=No drift, limb holds 90 (or 45) degrees for full 10 seconds  6b  Motor right leg:  0=No drift, limb holds 90 (or 45) degrees for full 10 seconds  7. Limb Ataxia: 0=Absent  8.  Sensory: 0=Normal; no sensory loss  9. Best Language:  1=Mild to moderate aphasia; some obvious loss of fluency or facility of comprehension without significant limitation on ideas expressed or form of expression.  10. Dysarthria: 0=Normal  11. Extinction and Inattention: 0=No abnormality  12. Distal motor function: 0=Normal   Total:   2   The patient is not a candidate for TPA given the onset of symptoms being outside the window as well as the patient's relatively rapid improvement and low NIH stroke scale.  The patient's history of present illness is concerning for the acuity with which she developed difficulty swallowing, word finding, dysarthria, and some facial droop.  The symptoms of improved even since she came to the emergency department with her triage nurse noting significant dysarthria that was not present when I spoke with her.  We are proceeding with stroke workup including swallow screen.  Her CT scan  without contrast did not show any acute abnormalities but I feel that she would benefit from inpatient stroke workup given her multiple risk factors and the probability she did have a TIA/CVA.  She also needs to have repeat enzymes performed for the chest pressure.  There is no evidence of ACS at this point although her symptoms may represent unstable angina.  She has received a full dose aspirin and I think cycling her enzymes now that she is chest pain-free is appropriate.  I will discuss all of this with the hospitalist.  The patient agrees with the plan and does not feel like she would be able to go home now regardless.   Clinical Course as of Mar 28 305  Sat Mar 28, 2017  0247 Paged hospitalist.  [CF]    Clinical Course User Index [CF] Hinda Kehr, MD    ____________________________________________  FINAL CLINICAL IMPRESSION(S) / ED DIAGNOSES  Final diagnoses:  Transient cerebral ischemia, unspecified type  Chest pain, unspecified type     MEDICATIONS GIVEN DURING THIS VISIT:  Medications  sodium chloride 0.9 % bolus 500 mL (not administered)     NEW OUTPATIENT MEDICATIONS STARTED DURING THIS VISIT:  New Prescriptions   No medications on file    Modified Medications   No medications on file    Discontinued Medications   No medications on file     Note:  This document was prepared using Dragon voice recognition software and may include unintentional dictation errors.    Hinda Kehr, MD 03/28/17 [phone]

## 2017-03-30 ENCOUNTER — Telehealth: Payer: Self-pay | Admitting: Cardiovascular Disease

## 2017-03-30 NOTE — Telephone Encounter (Signed)
Pt calling, she just recently was in the hospital and she had an echo done She is wondering if she still needs to come to her 7/26 echo appt, she needs to make transportaion plans if so Please give her a call and advise

## 2017-03-30 NOTE — Telephone Encounter (Signed)
She just had on ECHO on 03/28/17. No need to repeat, ok to cancel.

## 2017-04-02 ENCOUNTER — Other Ambulatory Visit: Payer: Medicare PPO

## 2017-04-03 DIAGNOSIS — G4733 Obstructive sleep apnea (adult) (pediatric): Secondary | ICD-10-CM | POA: Diagnosis not present

## 2017-04-03 LAB — HEMOGLOBIN A1C
HEMOGLOBIN A1C: 5.8 % — AB (ref 4.8–5.6)
Mean Plasma Glucose: 120 mg/dL

## 2017-04-06 ENCOUNTER — Telehealth: Payer: Self-pay | Admitting: *Deleted

## 2017-04-06 ENCOUNTER — Telehealth: Payer: Self-pay | Admitting: Cardiovascular Disease

## 2017-04-06 DIAGNOSIS — G4733 Obstructive sleep apnea (adult) (pediatric): Secondary | ICD-10-CM

## 2017-04-06 NOTE — Telephone Encounter (Signed)
Spoke with patient's daughter who is listed on [place]. Results of sleep study given. Orders for titration study entered. Nothing further needed at this time.

## 2017-04-06 NOTE — Telephone Encounter (Signed)
-----   Message from Laverle Hobby, MD sent at 04/03/2017  5:14 PM EDT ----- Regarding: sleep study result Positive for moderate OSA with AHI of 22. Recommend cpap titration study.

## 2017-04-06 NOTE — Telephone Encounter (Signed)
Lemuel calling from home health to report patient has refused PT mon-Friday b/c of leg pain.  He has one more visit ordered that he will attempt to do with patient and if needing more orders will call back to request.

## 2017-04-07 ENCOUNTER — Telehealth: Payer: Self-pay | Admitting: Cardiovascular Disease

## 2017-04-07 NOTE — Telephone Encounter (Signed)
[name] from [facility] left notes from h h . Please call to discuss. [phone]

## 2017-04-07 NOTE — Telephone Encounter (Signed)
Left message for Christie to call back.

## 2017-04-07 NOTE — Telephone Encounter (Signed)
Spoke w/ [name].  She wanted to review that pt met her goals and is being d/c'd.

## 2017-04-13 NOTE — Telephone Encounter (Signed)
Home Health is calling to let us know is has still been unable to contact patient about therapy.  While on the phone he saw patient in driveway of residence .  He will talk to patient and fu with office tomorrow.

## 2017-04-16 NOTE — Telephone Encounter (Signed)
Home health would now like orders to continue PT   Please call to discuss  Pt 1 x a wk for 1 week then 2 x a week for 2 weeks then 1 x week for 1 week

## 2017-04-16 NOTE — Telephone Encounter (Signed)
They were unable to reach patient for PT visits so they want to get further approval for [facility] PT visits. Spoke with physical therapist and he just wants verbal approval for further visits. Reviewed all medications and he states that they still have a couple that are still at the pharmacy. Gave him verbal approval since patient has not used up previous visits and they are the same number as previously ordered. He was appreciative for the call and had no further questions.

## 2017-04-19 DIAGNOSIS — R600 Localized edema: Secondary | ICD-10-CM | POA: Insufficient documentation

## 2017-04-19 DIAGNOSIS — I10 Essential (primary) hypertension: Secondary | ICD-10-CM | POA: Insufficient documentation

## 2017-04-19 DIAGNOSIS — I890 Lymphedema, not elsewhere classified: Secondary | ICD-10-CM | POA: Insufficient documentation

## 2017-04-19 DIAGNOSIS — G4733 Obstructive sleep apnea (adult) (pediatric): Secondary | ICD-10-CM | POA: Insufficient documentation

## 2017-04-19 NOTE — Telephone Encounter (Signed)
[place] for PT thx TG

## 2017-04-19 NOTE — Progress Notes (Signed)
Cardiology Office Note  Date:  04/21/2017   ID:  [name], DOB 05-12-1946, MRN [id number]  PCP:  Denton Lank, MD   Chief Complaint  Patient presents with  . other    1 month f/u no complaints today. Meds reviewed verbally with pt.    HPI:  [name] is a very pleasant 71--year-old woman with  Medication confusion/Medication noncompliance morbid obesity,  severe lower extremity edema,  systolic CHF, ejection fraction 25%  , Up to EF 50% to 55% in 03/2017 sleep apnea, scheduled for repeat testing. Previous machine broke pulmonary disease,  severe hypertension,  likely secondary to medication noncompliance who presents For Follow-up of her hypertension, cardiomyopathy, edema  Recent hospitalization July 2018 for TIA and chest pain Slurring, head hurt, H/A, felt hot Hospital records reviewed with the patient in detail She had MRI brain without acute finding Neurology consult with no clear recommendations Chest pain resolved without intervention Carotid ultrasound without significant stenosis SNF was recommended   presents in a wheelchair Weight is Down 18 pounds Doing exercise, with physical therapy, has several weeks left Only eating 2 meals a day Drinks ensure in the morning Compliant with her medications Previously on Lasix, now taking torsemide 20 mrem daily Reports that she decreased her fluid intake Leg swelling has been stable Denies having any significant cough Legs getting stronger but slowly  Lab work reviewed showing hemoglobin A1c 5.7, creatinine 0.65  EKG on today's visit shows normal sinus rhythm with rate 83 bpm, T-wave abnormality in 1 and aVL. PVCs  Other past medical history reviewed Walking less, legs are getting weaker, gait instability  CPAP broken, was supposed to get new machine to primary care, has not received this yet Not sleeping well without her machine  On previous office visit in 2014, she reported having occasional skin  breakdown,  weeping sores, worse after spending time on her feet she denies any significant shortness of breath.    PMH:   has a past medical history of Abnormality of gait; Breast cancer ([place]) (2006); CHF (congestive heart failure) ([place]); Edema; GERD (gastroesophageal reflux disease); Hypertension; Hypokinesis; Malignant neoplasm of breast (female), unspecified site; Neuropathy; Obesity; Pain in joint, site unspecified; Sleep apnea; Tinea pedis; and Unspecified hearing loss.  PSH:    Past Surgical History:  Procedure Laterality Date  . BREAST LUMPECTOMY  2006   left breast   . CATARACT EXTRACTION     left eye  . HERNIA REPAIR    . TUBAL LIGATION      Current Outpatient Prescriptions  Medication Sig Dispense Refill  . aspirin EC 81 MG tablet Take 81 mg by mouth daily.      . cloNIDine (CATAPRES) 0.2 MG tablet Take 1 tablet (0.2 mg total) by mouth 2 (two) times daily. 60 tablet 11  . isosorbide mononitrate (IMDUR) 60 MG 24 hr tablet Take 1 tablet (60 mg total) by mouth 2 (two) times daily. 180 tablet 3  . omeprazole (PRILOSEC) 20 MG capsule Take 20 mg by mouth daily.    Marland Kitchen oxybutynin (DITROPAN) 5 MG tablet Take 5 mg by mouth 2 (two) times daily.    . potassium chloride SA (K-DUR,KLOR-CON) 20 MEQ tablet Take 1 tablet (20 mEq total) by mouth 2 (two) times daily. 60 tablet 6  . torsemide (DEMADEX) 20 MG tablet Take 1 tablet (20 mg total) by mouth daily. 120 tablet 6   No current facility-administered medications for this visit.      Allergies:   Patient has  no known allergies.   Social History:  The patient  reports that she quit smoking about 16 years ago. Her smoking use included Cigarettes. She has a 30.00 pack-year smoking history. She has never used smokeless tobacco. She reports that she does not drink alcohol or use drugs.   Family History:   family history includes Breast cancer in her sister; Hypertension in her brother.    Review of Systems: Review of Systems   Constitutional: Negative.   Respiratory: Positive for shortness of breath.   Cardiovascular: Positive for leg swelling.  Gastrointestinal: Negative.   Musculoskeletal: Negative.        Gait instability  Neurological: Negative.   Psychiatric/Behavioral: Negative.   All other systems reviewed and are negative.    PHYSICAL EXAM: VS:  BP 111/72 (BP Location: Left Arm, Patient Position: Sitting, Cuff Size: Large)   Pulse 83   Ht 5\' 9"  (1.753 m)   Wt 250 lb (113.4 kg)   BMI 36.92 kg/m  , BMI Body mass index is 36.92 kg/m. GEN: Well nourished, well developed, in no acute distress, obese, presents in a wheelchair  HEENT: normal  Neck: no JVD, carotid bruits, or masses Cardiac: RRR; no murmurs, rubs, or gallops, 1+ edema bilaterally below the knees, tender to palpation Respiratory:  clear to auscultation bilaterally, expiratory wheezes, normal work of breathing GI: soft, nontender, nondistended, + BS MS: no deformity or atrophy  Skin: warm and dry, no rash Neuro:  Strength and sensation are intact Psych: euthymic mood, full affect    Recent Labs: 01/31/2017: B Natriuretic Peptide 100.0 03/28/2017: ALT 10; BUN 38; Creatinine, Ser 1.33; Hemoglobin 11.9; Platelets 238; Potassium 4.1; Sodium 138    Lipid Panel Lab Results  Component Value Date   CHOL 141 03/28/2017   HDL 42 03/28/2017   LDLCALC 91 03/28/2017   TRIG 40 03/28/2017      Wt Readings from Last 3 Encounters:  04/21/17 250 lb (113.4 kg)  03/28/17 251 lb 11.2 oz (114.2 kg)  03/12/17 268 lb (121.6 kg)       ASSESSMENT AND PLAN:  Congestive heart failure, unspecified congestive heart failure chronicity, unspecified congestive heart failure type (HCC) - Weight is down dramatically from previous clinic visit Recommended she continue on torsemide 20 g daily Appears to be more medication compliant, blood pressure stable Recommended extra torsemide for worsening leg swelling or weight gain  Chronic diastolic CHF  (congestive heart failure) (HCC) Normal LV function on recent echocardiogram Results discussed with her  Essential hypertension, malignant Has a pillbox, compliant with her medications  Shortness of breath worsening symptoms of shortness of breath, cough, leg edema consistent with acute on chronic systolic CHF   Incontinence chronic issue   Now drinking less, tolerating torsemide 20 mg daily   Total encounter time more than 25 minutes  Greater than 50% was spent in counseling and coordination of care with the patient  Disposition:   F/U  6 month   Orders Placed This Encounter  Procedures  . EKG 12-Lead     Signed, Esmond Plants, M.D., Ph.D. 04/21/2017  [place], [place]

## 2017-04-21 ENCOUNTER — Ambulatory Visit (INDEPENDENT_AMBULATORY_CARE_PROVIDER_SITE_OTHER): Payer: Medicare PPO | Admitting: Cardiovascular Disease

## 2017-04-21 ENCOUNTER — Encounter: Payer: Self-pay | Admitting: Cardiovascular Disease

## 2017-04-21 VITALS — BP 111/72 | HR 83 | Ht 69.0 in | Wt 250.0 lb

## 2017-04-21 DIAGNOSIS — R0602 Shortness of breath: Secondary | ICD-10-CM | POA: Diagnosis not present

## 2017-04-21 DIAGNOSIS — R600 Localized edema: Secondary | ICD-10-CM | POA: Diagnosis not present

## 2017-04-21 DIAGNOSIS — I10 Essential (primary) hypertension: Secondary | ICD-10-CM

## 2017-04-21 DIAGNOSIS — I5032 Chronic diastolic (congestive) heart failure: Secondary | ICD-10-CM

## 2017-04-21 DIAGNOSIS — G4733 Obstructive sleep apnea (adult) (pediatric): Secondary | ICD-10-CM

## 2017-04-21 NOTE — Patient Instructions (Signed)
Go to [email or web address] Buy some compression hose, knee high  Medication Instructions:   Please stay on one torsemide daily BUT take extra torsemide after lunch for fluid retention/ worsening leg swelling, or shortness of breath  Labwork:  No new labs needed  Testing/Procedures:  No further testing at this time   Follow-Up: It was a pleasure seeing you in the office today. Please call us if you have new issues that need to be addressed before your next appt.  [phone]  Your physician wants you to follow-up in: 6 months.  You will receive a reminder letter in the mail two months in advance. If you don't receive a letter, please call our office to schedule the follow-up appointment.  If you need a refill on your cardiac medications before your next appointment, please call your pharmacy.

## 2017-04-29 ENCOUNTER — Telehealth: Payer: Self-pay | Admitting: Cardiovascular Disease

## 2017-04-29 ENCOUNTER — Encounter: Payer: Self-pay | Admitting: Internal Medicine

## 2017-04-29 ENCOUNTER — Ambulatory Visit: Payer: Medicare PPO | Attending: Internal Medicine

## 2017-04-29 DIAGNOSIS — G4761 Periodic limb movement disorder: Secondary | ICD-10-CM | POA: Insufficient documentation

## 2017-04-29 DIAGNOSIS — G4733 Obstructive sleep apnea (adult) (pediatric): Secondary | ICD-10-CM | POA: Insufficient documentation

## 2017-04-29 DIAGNOSIS — G4710 Hypersomnia, unspecified: Secondary | ICD-10-CM | POA: Diagnosis present

## 2017-04-29 NOTE — Telephone Encounter (Signed)
Lamuel with [facility] calling to let us know he is with patient She is stating she is not wanting to do therapy today.  Would like to report a miss visit  due to not feeling well.  He is going to try again on Friday   Just FYI

## 2017-04-29 NOTE — Telephone Encounter (Signed)
Just a FYI: [facility] calling to let us know that patient did not want to do therapy today and will retry on Friday.

## 2017-04-30 ENCOUNTER — Telehealth: Payer: Self-pay | Admitting: *Deleted

## 2017-04-30 DIAGNOSIS — G4733 Obstructive sleep apnea (adult) (pediatric): Secondary | ICD-10-CM

## 2017-04-30 NOTE — Telephone Encounter (Signed)
-----   Message from Laverle Hobby, MD sent at 04/30/2017  4:11 PM EDT ----- Regarding: CPAP titration results.   AutoCPAP with pressures range of 10-14 cmH2O  .Resmed Airfit F10 Full Face, Medium

## 2017-05-01 NOTE — Telephone Encounter (Signed)
LMTCB

## 2017-05-14 ENCOUNTER — Encounter: Payer: Self-pay | Admitting: *Deleted

## 2017-05-14 NOTE — Telephone Encounter (Signed)
Another attempt made to contact patient with results of cpap. A letter will be mailed to patient to contact office.

## 2017-05-22 NOTE — Telephone Encounter (Signed)
Patient returning call.  Please call Monday when [place] office is open.

## 2017-05-25 NOTE — Telephone Encounter (Signed)
Pt informed of results. Order placed. Nothing further needed. 

## 2017-05-25 NOTE — Addendum Note (Signed)
Addended by: Oscar La R on: 05/25/2017 09:07 AM   Modules accepted: Orders

## 2017-06-05 ENCOUNTER — Ambulatory Visit: Payer: Medicare PPO | Admitting: Internal Medicine

## 2017-06-12 ENCOUNTER — Ambulatory Visit (INDEPENDENT_AMBULATORY_CARE_PROVIDER_SITE_OTHER): Payer: Medicare PPO | Admitting: Internal Medicine

## 2017-06-12 ENCOUNTER — Encounter: Payer: Self-pay | Admitting: Internal Medicine

## 2017-06-12 VITALS — BP 126/80 | HR 77 | Resp 16 | Ht 69.0 in | Wt 258.0 lb

## 2017-06-12 DIAGNOSIS — G4733 Obstructive sleep apnea (adult) (pediatric): Secondary | ICD-10-CM | POA: Diagnosis not present

## 2017-06-12 NOTE — Progress Notes (Signed)
Name: [name] MRN: [id number] DOB: 1946/01/29     CONSULTATION DATE: 06/12/2017  REFERRING MD :  Dr. Rockey Situ   CHIEF COMPLAINT: Excessive daytime sleepiness    PATIENT PROFILE 71 year old Afro-American female seen today for excessive daytime sleepiness Patient was diagnosed with sleep apnea approximately 10 years ago Patient has excessive daytime sleepiness for the last several years Patient gets tired very easily at the end of the day  Patient has been having extreme fatigue and tiredness, lack of energy Patient has been told that she has very  Loud snoring every night Patient has been told that she struggles to breathe at night and gasps for air  Patient has significant cardiomyopathy with ejection fraction of 20% Patient has lower extremity swelling No signs of infection at this time Patient has chronic shortness of breath and chronic dyspnea on exertion Patient denies cough patient denies wheezing  Patient quit tobacco abuse 1999 Patient smoked for approximately 10 years 2 packs per day  Patient has a history of breast cancer 15 years ago Patient has a history of severe CHF and sees Dr. Candis Musa with cardiology   HPI No signs of infection at this time Patient showing signs of chronic CHF with lower extremity edema  No acute respirator distress at this time Patient states that she need to pay $46 dollars to get CPAP machine Still with fatigue untreated OSA  CPAP titration studies and results discussed with patient Recommendation auto CPAP with a pressure range of 10-14 cm of water pressure  REVIEW OF SYSTEMS:   Constitutional: Negative for fever, chills, weight loss, + malaise/fatigue and diaphoresis.  HENT: Negative for hearing loss, ear pain, nosebleeds, congestion, sore throat, neck pain, tinnitus and ear discharge.   Eyes: Negative for blurred vision, double vision, photophobia, pain, discharge and redness.  Respiratory: Negative for cough, hemoptysis,  sputum production, + shortness of breath, wheezing and stridor.   Cardiovascular: Negative for chest pain, palpitations, orthopnea, claudication, leg swelling and PND.  ALL OTHER ROS ARE NEGATIVE  Resp 16   Ht 5\' 9"  (1.753 m)   Wt 258 lb (117 kg)   BMI 38.10 kg/m    Physical Examination:   GENERAL:NAD, no fevers, chills, no weakness no fatigue HEAD: Normocephalic, atraumatic.  EYES: Pupils equal, round, reactive to light. Extraocular muscles intact. No scleral icterus.  MOUTH: Moist mucosal membrane.   EAR, NOSE, THROAT: Clear without exudates. No external lesions.  NECK: Supple. No thyromegaly. No nodules. No JVD.  PULMONARY:CTA B/L no wheezes, no crackles, no rhonchi CARDIOVASCULAR: S1 and S2. Regular rate and rhythm. No murmurs, rubs, or gallops. + edema.  GASTROINTESTINAL: Soft, nontender, nondistended. No masses. Positive bowel sounds.  MUSCULOSKELETAL: No swelling, clubbing, or edema. Range of motion full in all extremities.  NEUROLOGIC: Cranial nerves II through XII are intact. No gross focal neurological deficits.  SKIN: No ulceration, lesions, rashes, or cyanosis. Skin warm and dry. Turgor intact.  PSYCHIATRIC: Mood, affect within normal limits. The patient is awake, alert and oriented x 3. Insight, judgment intact.    Chest x-ray 01/31/2017 I have Independently reviewed images of  CXR   on 06/12/2017 Interpretation: No opacities no effusions no active disease noted  Echocardiogram November 2012 Ejection fraction of 20%  ASSESSMENT / PLAN: 71 year old pleasant African-American female with a previous history and diagnosis of sleep apnea with excessive daytime sleepiness loud snoring and gasping for air in the setting of severe cardiomyopathy with EF of 20% along with morbid obesity and deconditioned state, patient  underwent CPAP titration and she will need auto CPAP with a pressure range of 10-14 cm of water pressure at this time patient cannot afford her CPAP therapy and I  have advised to obtain her CPAP therapy as soon as possible  #1 excessive daytime sleepiness with previous diagnosis of sleep apnea Now with dx of OSA Will need to Obtain autoCPAP therapy ASAP-follow up after therapy started  #2 shortness of breath and dyspnea on exertion most likely related to severe cardiopathy The last EF noted on echocardiogram was 20% Continue Lasix as prescribed Continue to follow-up with cardiology as scheduled  #3 Obesity -recommend significant weight loss -recommend changing diet  #4 Deconditioned state -increased daily activity    Patient satisfied with Plan of action and management. All questions answered  Corrin Parker, M.D.  [facility]  Medical Director [place] Director [facility] Cardio-Pulmonary Department

## 2017-06-12 NOTE — Patient Instructions (Signed)
Please follow up after CPAP has been started

## 2017-07-27 ENCOUNTER — Telehealth: Payer: Self-pay | Admitting: Cardiovascular Disease

## 2017-07-27 NOTE — Telephone Encounter (Signed)
Pt states, for her apartment complex, she needs a letter stating she is unable to climb steps.

## 2017-07-27 NOTE — Telephone Encounter (Signed)
Yes we can write a letter Would prefer her on the first floor

## 2017-07-27 NOTE — Telephone Encounter (Signed)
S/w patient. She is in the process of getting an apartment. In order to get a ground level apartment, she needs a letter from Dr Rockey Situ saying she cannot climb stairs due to her lower extremity swelling and shortness of breath.  Advised I would ask Dr Rockey Situ and call her if we are able to generate a letter for her. She was appreciative and verbalized understanding.

## 2017-07-28 ENCOUNTER — Encounter: Payer: Self-pay | Admitting: *Deleted

## 2017-07-28 NOTE — Telephone Encounter (Signed)
Patient called back. I informed her it was ok for [place] to generate a letter for her purposes. She was very Patent attorney. She stated she will come next Monday to pick the letter up here in the office. Letter generated and placed at front desk for patient to pick up.

## 2017-07-28 NOTE — Telephone Encounter (Signed)
No answer. Left message to call back.   

## 2017-09-08 HISTORY — PX: BREAST BIOPSY: SHX20

## 2017-10-20 ENCOUNTER — Ambulatory Visit: Payer: Medicare PPO | Admitting: Cardiovascular Disease

## 2017-11-20 ENCOUNTER — Encounter: Payer: Self-pay | Admitting: Cardiovascular Disease

## 2017-11-20 ENCOUNTER — Ambulatory Visit: Payer: Medicare HMO | Admitting: Cardiovascular Disease

## 2017-11-20 VITALS — BP 160/90 | HR 66 | Ht 69.0 in | Wt 274.5 lb

## 2017-11-20 DIAGNOSIS — I5033 Acute on chronic diastolic (congestive) heart failure: Secondary | ICD-10-CM | POA: Diagnosis not present

## 2017-11-20 DIAGNOSIS — R635 Abnormal weight gain: Secondary | ICD-10-CM | POA: Diagnosis not present

## 2017-11-20 DIAGNOSIS — I10 Essential (primary) hypertension: Secondary | ICD-10-CM

## 2017-11-20 DIAGNOSIS — Z9989 Dependence on other enabling machines and devices: Secondary | ICD-10-CM

## 2017-11-20 DIAGNOSIS — R0602 Shortness of breath: Secondary | ICD-10-CM

## 2017-11-20 DIAGNOSIS — I5032 Chronic diastolic (congestive) heart failure: Secondary | ICD-10-CM

## 2017-11-20 DIAGNOSIS — G4733 Obstructive sleep apnea (adult) (pediatric): Secondary | ICD-10-CM | POA: Diagnosis not present

## 2017-11-20 DIAGNOSIS — R600 Localized edema: Secondary | ICD-10-CM | POA: Diagnosis not present

## 2017-11-20 DIAGNOSIS — I872 Venous insufficiency (chronic) (peripheral): Secondary | ICD-10-CM

## 2017-11-20 HISTORY — DX: Acute on chronic diastolic (congestive) heart failure: I50.33

## 2017-11-20 MED ORDER — TORSEMIDE 20 MG PO TABS: 20.0000 mg | ORAL_TABLET | Freq: Two times a day (BID) | ORAL | 6 refills | Status: DC

## 2017-11-20 NOTE — Patient Instructions (Addendum)
Needs home PT for falls and leg weakness  Medication Instructions:   Please increase the torsemide up to 20 mg in the Am and PM (after lunch) Potassium twice a day  Labwork:  BMP today  Testing/Procedures:  No further testing at this time   Follow-Up: It was a pleasure seeing you in the office today. Please call us if you have new issues that need to be addressed before your next appt.  [phone]  Your physician wants you to follow-up in: 1 month.  You will receive a reminder letter in the mail two months in advance. If you don't receive a letter, please call our office to schedule the follow-up appointment.  If you need a refill on your cardiac medications before your next appointment, please call your pharmacy.  For educational health videos Log in to : [email or web address] Or : [email or web address], password : [email or web address]

## 2017-11-20 NOTE — Progress Notes (Signed)
Cardiology Office Note  Date:  11/20/2017   ID:  Daila Elbert, DOB [birth year], MRN [id number]  PCP:  Denton Lank, MD   Chief Complaint  Patient presents with  . other    6 month follow up. Meds reviewed by the pt. verbally. Pt. c/o LE edema, shortness of breath and chest pain.     HPI:  Ms. Tun is a very pleasant 72--year-old woman with  Medication confusion/Medication noncompliance No family to help, uses [employer] for transportation morbid obesity,  severe lower extremity edema,  systolic CHF, ejection fraction 25% in 2012  , Up to EF 50% to 55% in 03/2017 sleep apnea, scheduled for repeat testing. Previous machine broke pulmonary disease,  severe hypertension,  likely secondary to medication noncompliance who presents For follow-up of her hypertension, cardiomyopathy, edema  Last clinic visit August 2018 Weight up 24 pounds Does not know if she is taking torsemide Did not take meds today BP elevated on today's visit Eats out at [employer] once a month Takes the bus with a neighbor to go get her groceries, uses a scooter  [place] 08/31/2017, Other falls, legs are weak Last year worked with PT  Just picked up torsemide 2 days ago, per pharmacy Does not known her pills  She does not know why her legs are swollen, tight, shiny, weight up 24 pounds Denies excessive fluid intake   CPAP not working  owes money to [facility], "$41" Reports she should be getting a new machine after 5 years  No recent lab work Daughter would like her to change primary care as they never do lab work at [facility]   presents in a wheelchair, no family with her Previously doing exercises with PT, none recently, now getting weaker  EKG personally reviewed by myself on todays visit Shows normal sinus rhythm rate 66 bpm T wave abnormality 1 and aVL V5 and V6  Other past medical history reviewed hospitalization July 2018 for TIA and chest pain Slurring, head hurt, H/A, felt hot She had MRI  brain without acute finding Neurology consult with no clear recommendations Chest pain resolved without intervention Carotid ultrasound without significant stenosis SNF was recommended  Walking less, legs are getting weaker, gait instability  CPAP broken, was supposed to get new machine to primary care, has not received this yet Not sleeping well without her machine  On previous office visit in 2014, she reported having occasional skin breakdown,  weeping sores, worse after spending time on her feet she denies any significant shortness of breath.    PMH:   has a past medical history of Abnormality of gait, Breast cancer ([place]) (2006), CHF (congestive heart failure) ([place]), Edema, GERD (gastroesophageal reflux disease), Hypertension, Hypokinesis, Malignant neoplasm of breast (female), unspecified site, Neuropathy, Obesity, Pain in joint, site unspecified, Sleep apnea, Tinea pedis, and Unspecified hearing loss.  PSH:    Past Surgical History:  Procedure Laterality Date  . BREAST LUMPECTOMY  2006   left breast   . CATARACT EXTRACTION     left eye  . HERNIA REPAIR    . TUBAL LIGATION      Current Outpatient Medications  Medication Sig Dispense Refill  . aspirin EC 81 MG tablet Take 81 mg by mouth daily.      . cloNIDine (CATAPRES) 0.2 MG tablet Take 1 tablet (0.2 mg total) by mouth 2 (two) times daily. 60 tablet 11  . isosorbide mononitrate (IMDUR) 60 MG 24 hr tablet Take 1 tablet (60 mg total) by mouth  2 (two) times daily. 180 tablet 3  . omeprazole (PRILOSEC) 20 MG capsule Take 20 mg by mouth daily.    Marland Kitchen oxybutynin (DITROPAN) 5 MG tablet Take 5 mg by mouth 2 (two) times daily.    . potassium chloride SA (K-DUR,KLOR-CON) 20 MEQ tablet Take 1 tablet (20 mEq total) by mouth 2 (two) times daily. 60 tablet 6  . torsemide (DEMADEX) 20 MG tablet Take 1 tablet (20 mg total) by mouth daily. 120 tablet 6   No current facility-administered medications for this visit.      Allergies:    Patient has no known allergies.   Social History:  The patient  reports that she quit smoking about 17 years ago. Her smoking use included cigarettes. She has a 30.00 pack-year smoking history. she has never used smokeless tobacco. She reports that she does not drink alcohol or use drugs.   Family History:   family history includes Breast cancer in her sister; Hypertension in her brother.    Review of Systems: Review of Systems  Constitutional: Negative.        Weight gain  Respiratory: Positive for shortness of breath.   Cardiovascular: Positive for leg swelling.  Gastrointestinal: Negative.   Musculoskeletal: Positive for falls.       Gait instability  Neurological: Negative.   Psychiatric/Behavioral: Negative.   All other systems reviewed and are negative.    PHYSICAL EXAM: VS:  BP (!) 160/90 (BP Location: Right Arm, Patient Position: Sitting, Cuff Size: Large)   Pulse 66   Ht 5\' 9"  (1.753 m)   Wt 274 lb 8 oz (124.5 kg)   BMI 40.54 kg/m  , BMI Body mass index is 40.54 kg/m. Constitutional:  oriented to person, place, and time. No distress.  HENT:  Head: Normocephalic and atraumatic.  Eyes:  no discharge. No scleral icterus.  Neck: Normal range of motion. Neck supple. No JVD present.  Cardiovascular: Normal rate, regular rhythm, normal heart sounds and intact distal pulses. Exam reveals no gallop and no friction rub.  1-2+ tight pitting edema, very tender to palpation, to below the knee No murmur heard. Pulmonary/Chest: Effort normal and breath sounds normal. No stridor. No respiratory distress.  no wheezes.  no rales.  no tenderness.  Abdominal: Soft.  no distension.  no tenderness.  Musculoskeletal: Normal range of motion.  no  tenderness or deformity.  Neurological:  normal muscle tone. Coordination normal. No atrophy Skin: Skin is warm and dry. No rash noted. not diaphoretic.  Psychiatric:  normal mood and affect. behavior is normal. Thought content normal.     Recent Labs: 01/31/2017: B Natriuretic Peptide 100.0 03/28/2017: ALT 10; BUN 38; Creatinine, Ser 1.33; Hemoglobin 11.9; Platelets 238; Potassium 4.1; Sodium 138    Lipid Panel Lab Results  Component Value Date   CHOL 141 03/28/2017   HDL 42 03/28/2017   LDLCALC 91 03/28/2017   TRIG 40 03/28/2017      Wt Readings from Last 3 Encounters:  11/20/17 274 lb 8 oz (124.5 kg)  06/12/17 258 lb (117 kg)  04/21/17 250 lb (113.4 kg)       ASSESSMENT AND PLAN:  Congestive heart failure, unspecified congestive heart failure chronicity, unspecified congestive heart failure type (HCC) - Weight is up dramatically from August 2018 now 24 pounds Legs are tender, tight Likely has venous insufficiency Unable to exclude component of diastolic CHF Recommend to increase torsemide up to 20 twice daily home Continue potassium twice a day BMP ordered   Essential  hypertension, malignant Blood pressure elevated Did not take her medications today  Shortness of breath worsening symptoms of shortness of breath, leg edema weight gain 24 pounds consistent with acute on chronic diastolic CHF  Will increase torsemide as above, basic metabolic panel  Lower extremity edema Unable to wear compression hose, unable to reach her feet secondary to body habitus Unable to apply Ace wraps 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, hyperkeratosis possible fibrosis This is a chronic issue, we have previously recommended elevation, exercise with recommendation for compression stockings class I She has been unable to wear the compression stockings, unable to apply them herself and has no help at home , limited by body habitus  Despite conservative treatments in the past symptoms have persisted still with stage II lymphedema,  Incontinence chronic issue   Obstructive sleep apnea We have called [facility] to inquire about her broken CPAP Perhaps  charity services could be used to help find her broken machine  Leg weakness/falls Frequent falls at home, legs are getting weaker Lives alone, independent threatened by her deconditioning  Recommend we have PT evaluation in the home with therapy to decrease risk of falls and injury    Total encounter time more than 45 minutes  Greater than 50% was spent in counseling and coordination of care with the patient  Disposition:   F/U  1 month   No orders of the defined types were placed in this encounter.    Signed, Esmond Plants, M.D., Ph.D. 11/20/2017  [place], [place]

## 2017-11-21 LAB — BASIC METABOLIC PANEL
BUN/Creatinine Ratio: 20 (ref 12–28)
BUN: 14 mg/dL (ref 8–27)
CALCIUM: 9.2 mg/dL (ref 8.7–10.3)
CHLORIDE: 103 mmol/L (ref 96–106)
CO2: 29 mmol/L (ref 20–29)
Creatinine, Ser: 0.7 mg/dL (ref 0.57–1.00)
GFR calc Af Amer: 100 mL/min/{1.73_m2} (ref >59)
GFR calc non Af Amer: 87 mL/min/{1.73_m2} (ref >59)
GLUCOSE: 88 mg/dL (ref 65–99)
POTASSIUM: 3.7 mmol/L (ref 3.5–5.2)
SODIUM: 147 mmol/L — AB (ref 134–144)

## 2017-11-25 ENCOUNTER — Telehealth: Payer: Self-pay | Admitting: Cardiovascular Disease

## 2017-11-25 NOTE — Telephone Encounter (Signed)
Left voicemail message to call back. Just checking with patient regarding home health visits because they have not been able to reach patient to schedule.

## 2017-11-25 NOTE — Telephone Encounter (Signed)
Spoke with patient and reviewed lab results with her. Also made her aware that home health agency has been trying to reach her to schedule her first visit. She requested that I give them her cell number to reach her. She was appreciative for the call and had no further questions at this time. Message sent to [facility] making them aware of her number.

## 2017-12-19 NOTE — Progress Notes (Signed)
Cardiology Office Note  Date:  12/21/2017   ID:  Marili Vader, DOB Jul 18, 1946, MRN [id number]  PCP:  Denton Lank, MD   Chief Complaint  Patient presents with  . Other    1 month follow up. Patient c/o swelling in legs, SOB, and Chest pain. Meds reviewed verbally with patient.     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 Previous machine broke pulmonary disease,  severe hypertension,  likely secondary to medication noncompliance who presents For follow-up of her hypertension, cardiomyopathy, edema  No family with her on today's visit Recently seen last month for weight gain, leg swelling We had recommended lymphedema compression pumps, order was placed Recommended PT with Theodis Blaze We were going to look into assistance to help her get a CPAP  None of the above has happened in the past month Still sleeping poorly Has not received a phone call from the physical therapist  On today's visit, the transportation service [employer] dropped her off in wrong part of the hospital.  Had to walk a long way No pills taken this AM, not even her blood pressure pills Blood pressure running high Blood pressure was elevated on her last clinic visit as well  Reports having some abdominal cramping at times  Reports she is taking her torsemide twice a day We did confirm with pharmacy on her last clinic visit she does have her torsemide  Eats out at sals once a month Denies excessive fluid intake Takes the bus with a neighbor to go get her groceries, uses a scooter  [place] 08/31/2017, Other falls, legs are weak Last year worked with PT  Per my notes on my last clinic visit  CPAP not working  owes [facility] to [facility], "$41" Reports she should be getting a new machine after 5 years  EKG personally reviewed by myself on todays visit Shows normal sinus rhythm rate 68 bpm T wave abnormality 1 and aVL  APCs noted  Other past medical history reviewed hospitalization July 2018 for TIA and chest pain Slurring, head hurt, H/A, felt hot She had MRI brain without acute finding Neurology consult with no clear recommendations Chest pain resolved without intervention Carotid ultrasound without significant stenosis SNF was recommended  Walking less, legs are getting weaker, gait instability  CPAP broken, was supposed to get new machine to primary care, has not received this yet Not sleeping well without her machine  On previous office visit in 2014, she reported having occasional skin breakdown,  weeping sores, worse after spending time on her feet she denies any significant shortness of breath.    PMH:   has a past medical history of Abnormality of gait, Breast cancer ([place]) (2006), CHF (congestive heart failure) ([place]), Edema, GERD (gastroesophageal reflux disease), Hypertension, Hypokinesis, Malignant neoplasm of breast (female), unspecified site, Neuropathy, Obesity, Pain in joint, site unspecified, Sleep apnea, Tinea pedis, and Unspecified hearing loss.  PSH:    Past Surgical History:  Procedure Laterality Date  . BREAST LUMPECTOMY  2006   left breast   . CATARACT EXTRACTION     left eye  . HERNIA REPAIR    . TUBAL LIGATION      Current Outpatient Medications  Medication Sig Dispense Refill  .  aspirin EC 81 MG tablet Take 81 mg by mouth daily.      . cloNIDine (CATAPRES) 0.2 MG tablet Take 1 tablet (0.2 mg total) by mouth 2 (two) times daily. 60 tablet 11  . omeprazole (PRILOSEC) 20 MG capsule Take 20 mg by mouth daily.    Marland Kitchen oxybutynin (DITROPAN) 5 MG tablet Take 5 mg by mouth 2 (two) times daily.    . potassium chloride SA (K-DUR,KLOR-CON) 20 MEQ tablet Take 1 tablet (20 mEq total) by mouth  2 (two) times daily. 60 tablet 6  . torsemide (DEMADEX) 20 MG tablet Take 1 tablet (20 mg total) by mouth 2 (two) times daily. 60 tablet 6  . isosorbide mononitrate (IMDUR) 60 MG 24 hr tablet Take 1 tablet (60 mg total) by mouth 2 (two) times daily. 180 tablet 3   No current facility-administered medications for this visit.      Allergies:   Patient has no known allergies.   Social History:  The patient  reports that she quit smoking about 17 years ago. Her smoking use included cigarettes. She has a 30.00 pack-year smoking history. She has never used smokeless tobacco. She reports that she does not drink alcohol or use drugs.   Family History:   family history includes Breast cancer in her sister; Hypertension in her brother.    Review of Systems: Review of Systems  Constitutional: Positive for malaise/fatigue.       Weight gain  Respiratory: Positive for shortness of breath.   Cardiovascular: Positive for leg swelling.  Gastrointestinal: Negative.   Musculoskeletal: Positive for falls.       Gait instability  Neurological: Negative.   Psychiatric/Behavioral: Negative.   All other systems reviewed and are negative.    PHYSICAL EXAM: VS:  BP (!) 174/78 (BP Location: Left Arm, Patient Position: Sitting, Cuff Size: Normal)   Pulse 68   Ht 5\' 9"  (1.753 m)   Wt 274 lb (124.3 kg) Comment: Patient did not weight at office today very unsteady on legs  BMI 40.46 kg/m  , BMI Body mass index is 40.46 kg/m.  No significant change compared to prior office visit Constitutional:  oriented to person, place, and time. No distress.  HENT:  Head: Normocephalic and atraumatic.  Eyes:  no discharge. No scleral icterus.  Neck: Normal range of motion. Neck supple. No JVD present.  Cardiovascular: Normal rate, regular rhythm, normal heart sounds and intact distal pulses. Exam reveals no gallop and no friction rub.  1- tight pitting edema,  tender to palpation, mid shin down, slight improvement  compared to last month No murmur heard. Pulmonary/Chest: Effort normal and breath sounds normal. No stridor. No respiratory distress.  no wheezes.  no rales.  no tenderness.  Abdominal: Soft.  no distension.  no tenderness.  Musculoskeletal: Normal range of motion.  no  tenderness or deformity.  Neurological:  normal muscle tone. Coordination normal. No atrophy Skin: Skin is warm and dry. No rash noted. not diaphoretic.  Psychiatric:  normal mood and affect. behavior is normal. Thought content normal.    Recent Labs: 01/31/2017: B Natriuretic Peptide 100.0 03/28/2017: ALT 10; Hemoglobin 11.9; Platelets 238 11/20/2017: BUN 14; Creatinine, Ser 0.70; Potassium 3.7; Sodium 147    Lipid Panel Lab Results  Component Value Date   CHOL 141 03/28/2017   HDL 42 03/28/2017   LDLCALC 91 03/28/2017   TRIG 40 03/28/2017      Wt Readings from Last 3 Encounters:  12/21/17 274 lb (124.3 kg)  11/20/17 274 lb 8 oz (124.5 kg)  06/12/17 258 lb (117 kg)       ASSESSMENT AND PLAN:  Congestive heart failure, unspecified congestive heart failure chronicity, unspecified congestive heart failure type ([place]) - Weight is up dramatically from August 2018, 24 pounds Reports she is back on her torsemide but weight has not changed Leg still swollen, tight tender Likely has venous insufficiency Previous office visit we ordered lymphedema compression pumps, they have not arrived We will look into this again Recommended she stay on torsemide 20 twice daily  Continue potassium twice a day Recent BMP normal, potassium low normal  Essential hypertension, malignant Blood pressure elevated Did not take her medications today  Shortness of breath worsening symptoms of shortness of breath, leg edema weight gain 24 pounds consistent with acute on chronic diastolic CHF  Will increase torsemide as above, basic metabolic panel  Lower extremity edema Unable to wear compression hose, unable to reach her feet  secondary to body habitus Unable to apply Ace wraps 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, hyperkeratosis possible fibrosis This is a chronic issue, we have previously recommended elevation, exercise with recommendation for compression stockings class I She has been unable to wear the compression stockings, unable to apply them herself and has no help at home , limited by body habitus  Despite conservative treatments in the past symptoms have persisted still with stage II lymphedema, We will look into again ordering lymphedema compression pumps Minimal change compared to prior office visit 1 month ago, still swollen, tender  Incontinence chronic issue  Uncertain if she is missing doses of torsemide  Obstructive sleep apnea We previously called [facility] to inquire about her broken CPAP Perhaps charity services could be used to help find her broken machine Will require into her situation again, she still does not have CPAP  Leg weakness/falls We ordered PT last visit 1 month ago but she is not receiving any therapy We will look into this again  Long discussion with patient and nursing concerning all of the details above such as setting up PT, lymphedema compression pumps and getting her back on CPAP  Total encounter time more than 45 minutes  Greater than 50% was spent in counseling and coordination of care with the patient  Disposition:   F/U  6 month   Orders Placed This Encounter  Procedures  . EKG 12-Lead     Signed, Esmond Plants, M.D., Ph.D. 12/21/2017  [place], [place]

## 2017-12-21 ENCOUNTER — Ambulatory Visit (INDEPENDENT_AMBULATORY_CARE_PROVIDER_SITE_OTHER): Payer: Medicare HMO | Admitting: Cardiovascular Disease

## 2017-12-21 ENCOUNTER — Encounter: Payer: Self-pay | Admitting: Cardiovascular Disease

## 2017-12-21 VITALS — BP 174/78 | HR 68 | Ht 69.0 in | Wt 274.0 lb

## 2017-12-21 DIAGNOSIS — I10 Essential (primary) hypertension: Secondary | ICD-10-CM

## 2017-12-21 DIAGNOSIS — G459 Transient cerebral ischemic attack, unspecified: Secondary | ICD-10-CM | POA: Diagnosis not present

## 2017-12-21 DIAGNOSIS — I5022 Chronic systolic (congestive) heart failure: Secondary | ICD-10-CM

## 2017-12-21 NOTE — Patient Instructions (Addendum)
Can we call [facility] and see how much she owes She needs a new CPAP Old machine does not work well ( a message was sent to the Pulmonary nurses for Dr. Mortimer Fries to please call and follow up with this and touch base with you- you are due to see him back).  Can we send another message to [place], Needs home PT   Medication Instructions:   No medication changes made  Labwork:  No new labs needed  Testing/Procedures:  No further testing at this time   Follow-Up: It was a pleasure seeing you in the office today. Please call us if you have new issues that need to be addressed before your next appt.  [phone]  Your physician wants you to follow-up in: 6 months.  You will receive a reminder letter in the mail two months in advance. If you don't receive a letter, please call our office to schedule the follow-up appointment.  If you need a refill on your cardiac medications before your next appointment, please call your pharmacy.  For educational health videos Log in to : [email or web address] Or : [email or web address], password : [email or web address]

## 2018-01-13 ENCOUNTER — Other Ambulatory Visit: Payer: Self-pay | Admitting: Family Medicine

## 2018-01-13 DIAGNOSIS — Z1231 Encounter for screening mammogram for malignant neoplasm of breast: Secondary | ICD-10-CM

## 2018-01-19 ENCOUNTER — Encounter: Payer: Self-pay | Admitting: Internal Medicine

## 2018-01-19 ENCOUNTER — Ambulatory Visit: Payer: Medicare HMO | Admitting: Internal Medicine

## 2018-01-19 VITALS — BP 128/80 | HR 74 | Ht 69.0 in | Wt 270.0 lb

## 2018-01-19 DIAGNOSIS — G4733 Obstructive sleep apnea (adult) (pediatric): Secondary | ICD-10-CM

## 2018-01-19 NOTE — Patient Instructions (Signed)
Recommend obtaining Auto CPAP titration ASAP

## 2018-01-19 NOTE — Progress Notes (Signed)
Name: [name] MRN: [id number] DOB: 26-Nov-1945     CONSULTATION DATE: 01/19/2018 REFERRING MD :  Dr. Rockey Situ  CHIEF COMPLAINT: Excessive daytime sleepiness    PATIENT PROFILE 72 year old Afro-American female seen today for excessive daytime sleepiness Patient was diagnosed with sleep apnea approximately 10 years ago Patient has excessive daytime sleepiness for the last several years Patient gets tired very easily at the end of the day  Patient has been having extreme fatigue and tiredness, lack of energy Patient has been told that she has very  Loud snoring every night Patient has been told that she struggles to breathe at night and gasps for air  Patient has significant cardiomyopathy with ejection fraction of 20% Patient has lower extremity swelling No signs of infection at this time Patient has chronic shortness of breath and chronic dyspnea on exertion Patient denies cough patient denies wheezing  Patient quit tobacco abuse 1999 Patient smoked for approximately 10 years 2 packs per day  Patient has a history of breast cancer 15 years ago Patient has a history of severe CHF and sees Dr. Candis Musa with cardiology   HPI No signs of infection at this time Patient showing signs of chronic CHF with lower extremity edema She still has not received autocpap titration yet  I have advised her to do this ASAP This will help her CHF as well  No acute respirator distress at this time Patient states that she need to pay $47 dollars to get CPAP machine Still with fatigue untreated OSA  CPAP titration studies and results discussed with patient Recommendation auto CPAP with a pressure range of 10-14 cm of water pressure  REVIEW OF SYSTEMS:   Constitutional: Negative for fever, chills, weight loss, + malaise/fatigue and diaphoresis.  HENT: Negative for hearing loss, ear pain, nosebleeds, congestion, sore throat, neck pain, tinnitus and ear discharge.   Eyes: Negative for  blurred vision, double vision, photophobia, pain, discharge and redness.  Respiratory: Negative for cough, hemoptysis, sputum production, + shortness of breath, wheezing and stridor.   Cardiovascular: Negative for chest pain, palpitations, orthopnea, claudication, leg swelling and PND.  ALL OTHER ROS ARE NEGATIVE  Ht 5\' 9"  (1.753 m)   Wt 270 lb (122.5 kg)   BMI 39.87 kg/m  BP 128/80 (BP Location: Left Arm, Cuff Size: Normal)   Pulse 74   Ht 5\' 9"  (1.753 m)   Wt 270 lb (122.5 kg)   SpO2 98%   BMI 39.87 kg/m    Physical Examination:   GENERAL:NAD, no fevers, chills, no weakness no fatigue HEAD: Normocephalic, atraumatic.  EYES: Pupils equal, round, reactive to light. Extraocular muscles intact. No scleral icterus.  MOUTH: Moist mucosal membrane.   EAR, NOSE, THROAT: Clear without exudates. No external lesions.  NECK: Supple. No thyromegaly. No nodules. No JVD.  PULMONARY:CTA B/L no wheezes, no crackles, no rhonchi CARDIOVASCULAR: S1 and S2. Regular rate and rhythm. No murmurs, rubs, or gallops. + edema.  GASTROINTESTINAL: Soft, nontender, nondistended. No masses. Positive bowel sounds.  MUSCULOSKELETAL: No swelling, clubbing, or edema. Range of motion full in all extremities.  NEUROLOGIC: Cranial nerves II through XII are intact. No gross focal neurological deficits.  SKIN: No ulceration, lesions, rashes, or cyanosis. Skin warm and dry. Turgor intact.  PSYCHIATRIC: Mood, affect within normal limits. The patient is awake, alert and oriented x 3. Insight, judgment intact.    Chest x-ray 01/31/2017 I have Independently reviewed images of  CXR    Interpretation: No opacities no effusions no active disease  noted  Echocardiogram November 2012 Ejection fraction of 20%  ASSESSMENT / PLAN: 72 year old pleasant African-American female with a previous history and diagnosis of sleep apnea with excessive daytime sleepiness loud snoring and gasping for air in the setting of severe  cardiomyopathy with EF of 20% along with morbid obesity and deconditioned state, patient underwent CPAP titration and she will need auto CPAP with a pressure range of 10-14 cm of water pressure at this time patient cannot afford her CPAP therapy and I have advised to obtain her CPAP therapy as soon as possible  #1 excessive daytime sleepiness with previous diagnosis of sleep apnea Now with dx of OSA Will need to Obtain autoCPAP therapy ASAP-follow up after therapy started  #2 shortness of breath and dyspnea on exertion most likely related to severe cardiopathy The last EF noted on echocardiogram was 20% Continue Lasix as prescribed Continue to follow-up with cardiology as scheduled  #3 Obesity -recommend significant weight loss -recommend changing diet  #4 Deconditioned state -increased daily activity    Patient satisfied with Plan of action and management. All questions answered Follow up after titration study completed   Corrin Parker, M.D.  [facility]  Medical Director [place] Director [facility] Cardio-Pulmonary Department

## 2018-01-22 ENCOUNTER — Telehealth: Payer: Self-pay | Admitting: Internal Medicine

## 2018-01-22 NOTE — Telephone Encounter (Signed)
[facility] is unable to complete referral for the CPAP, due to pt issues, states pt is in collections.

## 2018-01-27 ENCOUNTER — Telehealth: Payer: Self-pay | Admitting: Cardiovascular Disease

## 2018-01-27 NOTE — Telephone Encounter (Signed)
Spoke with patient and recommended that she check with her PCP for that letter. She verbalized understanding with no further questions at this time.

## 2018-01-27 NOTE — Telephone Encounter (Signed)
Patient calling Patient is trying to get a handicap apartment where she can be on the bottom floor She would like to know if Dr. Rockey Situ can write a letter that she can submit with application stating what her condition is  Please call to discuss

## 2018-02-10 NOTE — Telephone Encounter (Signed)
Patient calling to let us know she has made a payment and is now able to obtain cpap.  Please contact [facility] on behalf of patient .

## 2018-02-11 NOTE — Telephone Encounter (Signed)
I just called [name] with [facility] even though the patient has now paid her bill the sleep study is more then 75 months old, almost 72 year old and the insurance requires that she have a new diagnostic sleep study.

## 2018-02-12 NOTE — Telephone Encounter (Signed)
Order has been sent to [facility] per [facility]

## 2018-02-12 NOTE — Telephone Encounter (Signed)
Send the order to [facility] please. Pt has never been set up with a CPAP. I have spoken with Maudie Mercury and she will check into it and make sure they can take her for her CPAP.

## 2018-02-15 ENCOUNTER — Telehealth: Payer: Self-pay | Admitting: Internal Medicine

## 2018-02-15 NOTE — Telephone Encounter (Signed)
Rec'd confirmation from [place] with [facility] that she has this order Dorann Ou, [name]        Got the order

## 2018-02-15 NOTE — Telephone Encounter (Signed)
Returned call to patient to make aware [employer] will be her new DME company. Awaiting order to be signed by MD, then we will fax back. Nothing further needed.

## 2018-02-15 NOTE — Telephone Encounter (Signed)
Pt calling stating she's now paid of her bill with [place] and would was told we would call her Home health nurse to let her know if patient would need another sleep study or not She is calling to get an update on this  Please call back

## 2018-04-02 ENCOUNTER — Ambulatory Visit
Admission: RE | Admit: 2018-04-02 | Discharge: 2018-04-02 | Disposition: A | Discharge status: Home / Self Care | Payer: Medicare HMO | Source: Ambulatory Visit | Attending: Family Medicine | Admitting: Family Medicine

## 2018-04-02 DIAGNOSIS — Z1231 Encounter for screening mammogram for malignant neoplasm of breast: Secondary | ICD-10-CM | POA: Insufficient documentation

## 2018-04-08 ENCOUNTER — Other Ambulatory Visit: Payer: Self-pay | Admitting: Family Medicine

## 2018-04-08 DIAGNOSIS — N6310 Unspecified lump in the right breast, unspecified quadrant: Secondary | ICD-10-CM

## 2018-04-08 DIAGNOSIS — R928 Other abnormal and inconclusive findings on diagnostic imaging of breast: Secondary | ICD-10-CM

## 2018-04-15 ENCOUNTER — Ambulatory Visit
Admission: RE | Admit: 2018-04-15 | Discharge: 2018-04-15 | Disposition: A | Discharge status: Home / Self Care | Payer: Medicare HMO | Source: Ambulatory Visit | Attending: Family Medicine | Admitting: Family Medicine

## 2018-04-15 DIAGNOSIS — N6310 Unspecified lump in the right breast, unspecified quadrant: Secondary | ICD-10-CM

## 2018-04-15 DIAGNOSIS — R928 Other abnormal and inconclusive findings on diagnostic imaging of breast: Secondary | ICD-10-CM

## 2018-04-20 ENCOUNTER — Ambulatory Visit
Admission: RE | Admit: 2018-04-20 | Discharge: 2018-04-20 | Disposition: A | Discharge status: Home / Self Care | Payer: 59 | Source: Ambulatory Visit | Attending: Family Medicine | Admitting: Family Medicine

## 2018-04-20 DIAGNOSIS — R928 Other abnormal and inconclusive findings on diagnostic imaging of breast: Secondary | ICD-10-CM | POA: Insufficient documentation

## 2018-04-20 DIAGNOSIS — N6310 Unspecified lump in the right breast, unspecified quadrant: Secondary | ICD-10-CM | POA: Diagnosis present

## 2018-04-22 ENCOUNTER — Other Ambulatory Visit: Payer: Self-pay | Admitting: Family Medicine

## 2018-04-22 DIAGNOSIS — N6310 Unspecified lump in the right breast, unspecified quadrant: Secondary | ICD-10-CM

## 2018-04-22 DIAGNOSIS — R928 Other abnormal and inconclusive findings on diagnostic imaging of breast: Secondary | ICD-10-CM

## 2018-04-28 ENCOUNTER — Ambulatory Visit
Admission: RE | Admit: 2018-04-28 | Discharge: 2018-04-28 | Disposition: A | Discharge status: Home / Self Care | Payer: Medicare HMO | Source: Ambulatory Visit | Attending: Family Medicine | Admitting: Family Medicine

## 2018-04-28 DIAGNOSIS — N6310 Unspecified lump in the right breast, unspecified quadrant: Secondary | ICD-10-CM | POA: Insufficient documentation

## 2018-04-28 DIAGNOSIS — R928 Other abnormal and inconclusive findings on diagnostic imaging of breast: Secondary | ICD-10-CM

## 2018-04-29 ENCOUNTER — Other Ambulatory Visit: Payer: Self-pay | Admitting: Anatomic Pathology & Clinical Pathology

## 2018-05-02 ENCOUNTER — Other Ambulatory Visit: Payer: Self-pay | Admitting: Anatomic Pathology & Clinical Pathology

## 2018-05-03 ENCOUNTER — Encounter: Payer: Self-pay | Admitting: *Deleted

## 2018-05-03 LAB — SURGICAL PATHOLOGY

## 2018-05-03 NOTE — Progress Notes (Signed)
  Oncology Nurse Navigator Documentation  Navigator Location: [facility] Onc (05/03/18 1600)   )Navigator Encounter Type: Telephone (05/03/18 1600) Telephone: Outgoing Call (05/03/18 1600) Abnormal Finding Date: 04/20/18 (05/03/18 1600) Confirmed Diagnosis Date: 04/29/18 (05/03/18 1600)                   Barriers/Navigation Needs: Coordination of Care (05/03/18 1600)   Interventions: Coordination of Care;Education (05/03/18 1600)                      Time Spent with Patient: 15 (05/03/18 1600)   Left patient a message to return my call.  I would like to assist with coordination of care for surgical and medical oncology consult.

## 2018-05-04 ENCOUNTER — Other Ambulatory Visit: Payer: Self-pay | Admitting: *Deleted

## 2018-05-04 DIAGNOSIS — C50911 Malignant neoplasm of unspecified site of right female breast: Secondary | ICD-10-CM

## 2018-05-05 ENCOUNTER — Encounter: Payer: Self-pay | Admitting: *Deleted

## 2018-05-05 NOTE — Progress Notes (Signed)
  Oncology Nurse Navigator Documentation      )                          Barriers/Navigation Needs: Coordination of Care (05/05/18 0900)   Interventions: Coordination of Care (05/05/18 0900)   Coordination of Care: Appts (05/05/18 0900)                  Time Spent with Patient: 30 (05/05/18 0900)   Spoke to patient in regards to her new diagnosis of invasive right breast cancer.  She has a history of left breast cancer 14 years ago.  She was followed by Dr. Oliva Bustard and requested to see him again.  Explained that he had retired, but we have some other really good physicians.  She also requested Dr. Bary Castilla, but does not want an appointment until after 05/11/18.  I have scheduled her to see Dr. Janese Banks on 05/13/18 @ 11:00 and Dr. Bary Castilla on 05/18/18 @ 4:30.  Explained I would give her her educational literature when she see's Dr. Janese Banks.  She is agreeable.  She is to call with any questions or needs.

## 2018-05-07 ENCOUNTER — Ambulatory Visit: Payer: 59 | Admitting: Oncology

## 2018-05-13 ENCOUNTER — Encounter: Payer: Self-pay | Admitting: *Deleted

## 2018-05-13 ENCOUNTER — Encounter: Payer: Self-pay | Admitting: Oncology

## 2018-05-13 ENCOUNTER — Other Ambulatory Visit: Payer: Self-pay

## 2018-05-13 ENCOUNTER — Other Ambulatory Visit: Payer: Self-pay | Admitting: *Deleted

## 2018-05-13 ENCOUNTER — Inpatient Hospital Stay: Payer: Medicare HMO | Attending: Oncology | Admitting: Oncology

## 2018-05-13 VITALS — BP 173/80 | HR 56 | Temp 96.4°F | Resp 18 | Ht 64.17 in | Wt 276.8 lb

## 2018-05-13 DIAGNOSIS — I10 Essential (primary) hypertension: Secondary | ICD-10-CM | POA: Insufficient documentation

## 2018-05-13 DIAGNOSIS — Z87891 Personal history of nicotine dependence: Secondary | ICD-10-CM | POA: Diagnosis not present

## 2018-05-13 DIAGNOSIS — Z170 Estrogen receptor positive status [ER+]: Secondary | ICD-10-CM | POA: Diagnosis not present

## 2018-05-13 DIAGNOSIS — Z803 Family history of malignant neoplasm of breast: Secondary | ICD-10-CM

## 2018-05-13 DIAGNOSIS — C50911 Malignant neoplasm of unspecified site of right female breast: Secondary | ICD-10-CM | POA: Insufficient documentation

## 2018-05-13 DIAGNOSIS — C50411 Malignant neoplasm of upper-outer quadrant of right female breast: Secondary | ICD-10-CM | POA: Diagnosis not present

## 2018-05-13 DIAGNOSIS — Z7189 Other specified counseling: Secondary | ICD-10-CM

## 2018-05-13 NOTE — Progress Notes (Signed)
  Oncology Nurse Navigator Documentation  Navigator Location: [facility] On[facility] (05/13/18 1200) Referral date to RadOnc/MedOnc: 05/13/18 (05/13/18 1200) )Navigator Encounter Type: Initial MedOnc (05/13/18 1200)         Genetic Counseling Date: 05/13/18 (05/13/18 1200) Genetic Counseling Type: Non-Urgent (05/13/18 1200)         Patient Visit Type: MedOnc (05/13/18 1200) Treatment Phase: Pre-Tx/Tx Discussion (05/13/18 1200) Barriers/Navigation Needs: Education (05/13/18 1200) Education: Newly Diagnosed Cancer Education (05/13/18 1200) Interventions: Education (05/13/18 1200)     Education Method: Verbal;Written (05/13/18 1200)                Time Spent with Patient: 60 (05/13/18 1200)   Met patient and her family member today during her initial medical oncology consult with Dr. Janese Banks.  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  States she had chemo and radiation therapy at that time.  New diagnosis of ER/PR + Her2- right breast cancer. Family history of breast cancer in her sister age 21-26.  Dr. Janese Banks has recommended genetic testing and the patient is agreeable.  Gave patient breast cancer educational literature, "My Breast Cancer Treatment Handbook" by Josephine Igo, RN.  Patient states she received a book years ago, but she lost it.  She is to call with any questions or needs.

## 2018-05-13 NOTE — Progress Notes (Signed)
Hematology/Oncology Consult note [facility], [place] [phone] [phone]  Patient Care Team: Donnie Coffin, MD as PCP - General (Family Medicine) Minna Merritts, MD as Consulting Physician (Cardiology)   Name of the patient: [name]  [id number]  06-Oct-1945    Reason for referral- right breast cancer   Referring physician- Dr. Clide Deutscher  Date of visit: 05/13/18   History of presenting illness-patient is a 72 year old female with a prior history of left breast cancer in 2006.  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  She has been in remission since then.  She has been getting periodic mammograms and the most recent mammogram and ultrasound in August 2019 showed 0.9 x 0.9 x 0.8 cm irregular spiculated mass in the 11 o'clock position of the right breast.  There was a concern for an abnormal intramammary lymph node as well.  No other abnormal axillary adenopathy noted.  Patient underwent ultrasound-guided biopsy of both the right breast mass as well as the axillary lymph node lymph node biopsy was negative for malignancy.  Biopsy of the right breast mass showed 6 mm, grade 1 ER PR positive and HER-2/neu negative tumor.  Patient is morbidly obese and has difficulty with ambulation.  She uses a cane or a walker to ambulate around the house.  She sees Dr. Rockey Situ for her cardiology issues.  Reports that her appetite is good and she denies any unintentional weight loss or any new aches or pains anywhere.  Family history significant for breast cancer in her sister who died from breast cancer in her 20s.  Patient does not remember getting genetic testing done.  ECOG PS- 2-3  Pain scale- 7  Review of systems- Review of Systems  Constitutional: Positive for malaise/fatigue. Negative for chills, fever and weight loss.  HENT: Negative for congestion, ear discharge and nosebleeds.   Eyes: Negative  for blurred vision.  Respiratory: Positive for shortness of breath. Negative for cough, hemoptysis, sputum production and wheezing.   Cardiovascular: Negative for chest pain, palpitations, orthopnea and claudication.  Gastrointestinal: Negative for abdominal pain, blood in stool, constipation, diarrhea, heartburn, melena, nausea and vomiting.  Genitourinary: Negative for dysuria, flank pain, frequency, hematuria and urgency.  Musculoskeletal: Negative for back pain, joint pain and myalgias.  Skin: Negative for rash.  Neurological: Negative for dizziness, tingling, focal weakness, seizures, weakness and headaches.       Difficulty with ambulation  Endo/Heme/Allergies: Does not bruise/bleed easily.  Psychiatric/Behavioral: Negative for depression and suicidal ideas. The patient does not have insomnia.     No Known Allergies  Patient Active Problem List   Diagnosis Date Noted  . Malignant neoplasm of right female breast ([place]) 05/13/2018  . Venous insufficiency of both lower extremities 11/20/2017  . Acute on chronic diastolic CHF (congestive heart failure) ([place]) 11/20/2017  . Weight gain 11/20/2017  . Localized edema 04/19/2017  . Benign essential HTN 04/19/2017  . OSA (obstructive sleep apnea) 04/19/2017  . Morbid obesity ([place]) 04/19/2017  . TIA (transient ischemic attack) 03/28/2017  . Urinary incontinence 06/27/2016  . Chronic systolic CHF (congestive heart failure) ([place]) 04/20/2013  . HTN (hypertension) 07/16/2011  . CHF (congestive heart failure) ([place]) 07/15/2011  . Shortness of breath 07/15/2011  . Edema 07/15/2011  . Breast cancer ([place]) 09/08/2004     Past Medical History:  Diagnosis Date  . Abnormality of gait   . Breast cancer ([place]) 2006   LT LUMPECTOMY  . CHF (  congestive heart failure) ([place])    1) echo 07/2011: EF 20-25%, mild concentric hypertrophy, diffuse HK, regional WMA cannot be excluded, grade 1 DD, mitral valve with mild regurg, LA mildly dilated). 2) EF  40-45%, mild AS,                    . Edema   . GERD (gastroesophageal reflux disease)   . Hypertension   . Hypokinesis    global, EF 35-45 % from Echo.  . Malignant neoplasm of breast (female), unspecified site    left  . Neuropathy   . Obesity   . Pain in joint, site unspecified   . Sleep apnea   . Tinea pedis   . Unspecified hearing loss    bilateral     Past Surgical History:  Procedure Laterality Date  . BREAST LUMPECTOMY  2006   left breast   . CATARACT EXTRACTION     left eye  . HERNIA REPAIR    . TUBAL LIGATION      Social History   Socioeconomic History  . Marital status: Divorced    Spouse name: Not on file  . Number of children: Not on file  . Years of education: Not on file  . Highest education level: Not on file  Occupational History  . Occupation: retired  Scientific laboratory technician  . Financial resource strain: Not on file  . Food insecurity:    Worry: Not on file    Inability: Not on file  . Transportation needs:    Medical: Not on file    Non-medical: Not on file  Tobacco Use  . Smoking status: Former Smoker    Packs/day: 0.50    Years: 30.00    Pack years: 15.00    Types: Cigarettes    Last attempt to quit: 07/14/2000    Years since quitting: 17.8  . Smokeless tobacco: Never Used  Substance and Sexual Activity  . Alcohol use: No  . Drug use: No  . Sexual activity: Never  Lifestyle  . Physical activity:    Days per week: Not on file    Minutes per session: Not on file  . Stress: Not on file  Relationships  . Social connections:    Talks on phone: Not on file    Gets together: Not on file    Attends religious service: Not on file    Active member of club or organization: Not on file    Attends meetings of clubs or organizations: Not on file    Relationship status: Not on file  . Intimate partner violence:    Fear of current or ex partner: Not on file    Emotionally abused: Not on file    Physically abused: Not on file    Forced sexual  activity: Not on file  Other Topics Concern  . Not on file  Social History Narrative  . Not on file     Family History  Problem Relation Age of Onset  . Hypertension Brother   . Coronary artery disease Brother   . Hypertension Mother   . Cataracts Mother   . Breast cancer Sister 51     Current Outpatient Medications:  .  aspirin EC 81 MG tablet, Take 81 mg by mouth daily.  , Disp: , Rfl:  .  cloNIDine (CATAPRES) 0.2 MG tablet, Take 1 tablet (0.2 mg total) by mouth 2 (two) times daily., Disp: 60 tablet, Rfl: 11 .  clotrimazole (LOTRIMIN) 1 % cream,  APPLY TO AFFECTED AREA TWICE DAILY FOR FUNGAL INFECTION, Disp: , Rfl: 1 .  omeprazole (PRILOSEC) 20 MG capsule, Take 20 mg by mouth daily., Disp: , Rfl:  .  oxybutynin (DITROPAN) 5 MG tablet, Take 5 mg by mouth 2 (two) times daily., Disp: , Rfl:  .  potassium chloride SA (K-DUR,KLOR-CON) 20 MEQ tablet, Take 1 tablet (20 mEq total) by mouth 2 (two) times daily., Disp: 60 tablet, Rfl: 6 .  isosorbide mononitrate (IMDUR) 60 MG 24 hr tablet, Take 1 tablet (60 mg total) by mouth 2 (two) times daily., Disp: 180 tablet, Rfl: 3 .  losartan (COZAAR) 25 MG tablet, Take 25 mg by mouth daily. , Disp: , Rfl:  .  torsemide (DEMADEX) 20 MG tablet, Take 1 tablet (20 mg total) by mouth 2 (two) times daily., Disp: 60 tablet, Rfl: 6   Physical exam:  Vitals:   05/13/18 1114  BP: (!) 173/80  Pulse: (!) 56  Resp: 18  Temp: (!) 96.4 F (35.8 C)  Weight: 276 lb 12.8 oz (125.6 kg)  Height: 5' 4.17" (1.63 m)   Physical Exam  Constitutional: She is oriented to person, place, and time.  She is obese. Sitting ina wheelchair. Had difficulty and needed assistance to sit up on examination table  HENT:  Head: Normocephalic and atraumatic.  Eyes: Pupils are equal, round, and reactive to light. EOM are normal.  Neck: Normal range of motion.  Cardiovascular: Normal rate, regular rhythm and normal heart sounds.  Pulmonary/Chest: Effort normal and breath sounds  normal.  Abdominal: Soft. Bowel sounds are normal.  Musculoskeletal: She exhibits edema (b/l +2).  Neurological: She is alert and oriented to person, place, and time.  Skin: Skin is warm and dry.   Breast exam was performed in seated and lying down position. Patient is status post left lumpectomy with a well-healed surgical scar. No evidence of any palpable masses. No evidence of axillary adenopathy. No evidence of any palpable masses or lumps in the right breast. No evidence of right axillary adenopathy     CMP Latest Ref Rng & Units 11/20/2017  Glucose 65 - 99 mg/dL 88  BUN 8 - 27 mg/dL 14  Creatinine 0.57 - 1.00 mg/dL 0.70  Sodium 134 - 144 mmol/L 147(H)  Potassium 3.5 - 5.2 mmol/L 3.7  Chloride 96 - 106 mmol/L 103  CO2 20 - 29 mmol/L 29  Calcium 8.7 - 10.3 mg/dL 9.2  Total Protein 6.5 - 8.1 g/dL -  Total Bilirubin 0.3 - 1.2 mg/dL -  Alkaline Phos 38 - 126 U/L -  AST 15 - 41 U/L -  ALT 14 - 54 U/L -   CBC Latest Ref Rng & Units 03/28/2017  WBC 3.6 - 11.0 K/uL 7.2  Hemoglobin 12.0 - 16.0 g/dL 11.9(L)  Hematocrit 35.0 - 47.0 % 35.3  Platelets 150 - 440 K/uL 238    No images are attached to the encounter.  US Breast Ltd Uni Right Inc Axilla  Result Date: 04/20/2018 CLINICAL DATA:  72 year old female recalled from a screening mammogram dated 03/19/2017 for a possible right breast mass. The patient is wheelchair bound with difficulty raising the right arm. EXAM: DIGITAL DIAGNOSTIC RIGHT MAMMOGRAM WITH CAD AND TOMO ULTRASOUND RIGHT BREAST COMPARISON:  Previous exam(s). ACR Breast Density Category b: There are scattered areas of fibroglandular density. FINDINGS: A mass in the upper outer right breast at posterior depth persists and demonstrates spiculated margins. Intramammary lymph nodes in the upper outer quadrant at middle and posterior depth  demonstrate fatty hila on tomosynthesis views and are mammographically stable compared to remote prior mammograms. Further evaluation with  ultrasound was performed. Mammographic images were processed with CAD. Targeted ultrasound is performed, showing an irregular, spiculated mass at the 11 o'clock position 7 cm from the nipple with a hyperechoic rim. It measures 0.9 x 0.9 x 0 point 8 cm. Note is made of internal vascularity. This correlates well with the mammographic finding. An intramammary lymph node at the 10:30 position 7 cm from the nipple demonstrates borderline diffuse cortical thickening and mild hilar effacement. Evaluation of the right axilla severely limited due to the patient's mobility limitations. Lymph nodes with borderline cortical of thickening are identified. IMPRESSION: 1. Suspicious right breast mass corresponding with the screening mammographic findings. Recommendation is for ultrasound-guided biopsy. 2. Indeterminate intramammary lymph node at the 10:30 position with borderline cortical thickening and hilar effacement. Recommendation is for ultrasound-guided biopsy. 3. Indeterminate right axillary lymph node. Ultrasound guided biopsy can be attempted at the discretion of the performing radiologist. However, this may be difficult due to the patient's mobility limitations. RECOMMENDATION: Ultrasound-guided biopsies of a right breast mass and right intramammary/axillary lymph nodes. I have discussed the findings and recommendations with the patient. Results were also provided in writing at the conclusion of the visit. If applicable, a reminder letter will be sent to the patient regarding the next appointment. BI-RADS CATEGORY  4: Suspicious. Electronically Signed   By: Kristopher Oppenheim M.D.   On: 04/20/2018 15:37   Mm Diag Breast Tomo Uni Right  Result Date: 04/20/2018 CLINICAL DATA:  72 year old female recalled from a screening mammogram dated 03/19/2017 for a possible right breast mass. The patient is wheelchair bound with difficulty raising the right arm. EXAM: DIGITAL DIAGNOSTIC RIGHT MAMMOGRAM WITH CAD AND TOMO ULTRASOUND  RIGHT BREAST COMPARISON:  Previous exam(s). ACR Breast Density Category b: There are scattered areas of fibroglandular density. FINDINGS: A mass in the upper outer right breast at posterior depth persists and demonstrates spiculated margins. Intramammary lymph nodes in the upper outer quadrant at middle and posterior depth demonstrate fatty hila on tomosynthesis views and are mammographically stable compared to remote prior mammograms. Further evaluation with ultrasound was performed. Mammographic images were processed with CAD. Targeted ultrasound is performed, showing an irregular, spiculated mass at the 11 o'clock position 7 cm from the nipple with a hyperechoic rim. It measures 0.9 x 0.9 x 0 point 8 cm. Note is made of internal vascularity. This correlates well with the mammographic finding. An intramammary lymph node at the 10:30 position 7 cm from the nipple demonstrates borderline diffuse cortical thickening and mild hilar effacement. Evaluation of the right axilla severely limited due to the patient's mobility limitations. Lymph nodes with borderline cortical of thickening are identified. IMPRESSION: 1. Suspicious right breast mass corresponding with the screening mammographic findings. Recommendation is for ultrasound-guided biopsy. 2. Indeterminate intramammary lymph node at the 10:30 position with borderline cortical thickening and hilar effacement. Recommendation is for ultrasound-guided biopsy. 3. Indeterminate right axillary lymph node. Ultrasound guided biopsy can be attempted at the discretion of the performing radiologist. However, this may be difficult due to the patient's mobility limitations. RECOMMENDATION: Ultrasound-guided biopsies of a right breast mass and right intramammary/axillary lymph nodes. I have discussed the findings and recommendations with the patient. Results were also provided in writing at the conclusion of the visit. If applicable, a reminder letter will be sent to the patient  regarding the next appointment. BI-RADS CATEGORY  4: Suspicious. Electronically Signed  By: Kristopher Oppenheim M.D.   On: 04/20/2018 15:37   Mm Clip Placement Right  Result Date: 04/28/2018 CLINICAL DATA:  Two ultrasound-guided biopsies of the right breast were performed today. EXAM: DIAGNOSTIC RIGHT MAMMOGRAM POST ULTRASOUND BIOPSY COMPARISON:  Previous exam(s). FINDINGS: Mammographic images were obtained following ultrasound guided biopsy of a right breast mass 11 o'clock position 7 cm from nipple and an intramammary lymph node at 10:30 position 7 cm from the nipple. A ribbon shaped biopsy clip is satisfactorily positioned within or immediately adjacent to the suspicious mass at 11 o'clock position. A heart shaped biopsy clip is satisfactorily positioned within an intramammary lymph node in the 10:30 position right breast. IMPRESSION: Satisfactory position of ribbon shaped (mass at 11:00) and heart shaped biopsy clip (intramammary lymph node at 10:30) as described above. Final Assessment: Post Procedure Mammograms for Marker Placement Electronically Signed   By: Curlene Dolphin M.D.   On: 04/28/2018 11:42   [place] Rt Breast Bx W Loc Dev 1st Lesion Img Bx Spec US Guide  Addendum Date: 05/05/2018   ADDENDUM REPORT: 05/05/2018 15:53 ADDENDUM: Pathology of the right breast biopsy, 11:00 o'clock 7 cm from nipple revealed A. BREAST, RIGHT, 11:00 7 CM FROM NIPPLE; ULTRASOUND-GUIDED CORE BIOPSY: INVASIVE MAMMARY CARCINOMA, NO SPECIAL TYPE. Size of invasive carcinoma: 6 mm in this sample. Histologic grade of invasive carcinoma: Grade 1. Ductal carcinoma in situ: Not identified. Lymphovascular invasion: Not identified. ER/PR/HER2: Immunohistochemistry will be performed on block A1, with reflex to [place] for HER2 2+. The results will be reported in an addendum. Pathology of the right breast biopsy, 10:30 o'clock 7 cm from nipple revealed B. BREAST, RIGHT, 10:30 7 CM FROM NIPPLE; ULTRASOUND-GUIDED CORE BIOPSY: INTRAMAMMARY LYMPH  NODE, NEGATIVE FOR MALIGNANCY. This was found to be concordant with Dr. Theodosia Blender impression and notes. Recommendation: Surgical and oncology referrals. At the patient's request, results and recommendations were relayed to the patient and her daughter Rojelio Brenner by phone by Dr. Enriqueta Shutter. The patient did well following the biopsy. Post biopsy instructions were reviewed with the patient and daughter and all of their questions were answered. Referrals will be arranged by the nurse navigators for [facility]. Jetta Lout, RRA notified the nurse navigators of the request for referrals. At the patient's request, a surgical referral was made with Dr. Bary Castilla for 05/18/18 and Dr. Janese Banks for 05/13/18 by Tanya Nones, RN, nurse navigator as the patient requested the referrals be made after 05/11/18. The patient has been notified of the referrals. Addendum by Jetta Lout, RRA on 05/05/18. Electronically Signed   By: Franki Cabot M.D.   On: 05/05/2018 15:53   Addendum Date: 04/28/2018   ADDENDUM REPORT: 04/28/2018 14:36 ADDENDUM: Today, the right axilla was assessed in preparation for possible axillary node biopsy. Cine clip ultrasound images are obtained, demonstrating prominent vessels surrounding the lymph node which was recommended for possible biopsy on recent diagnostic report of 04/20/2018. Given the proximity of prominent vessels to this lymph node, a right axillary lymph node biopsy was not performed. Electronically Signed   By: Curlene Dolphin M.D.   On: 04/28/2018 14:36   Result Date: 05/05/2018 CLINICAL DATA:  Ultrasound-guided core needle biopsy is recommended of a suspicious mass in the 11 o'clock position of the right breast 7 cm from the nipple. EXAM: ULTRASOUND GUIDED RIGHT BREAST CORE NEEDLE BIOPSY COMPARISON:  Previous exam(s). FINDINGS: I met with the patient and we discussed the procedure of ultrasound-guided biopsy, including benefits and alternatives. We discussed the high likelihood of  a  successful procedure. We discussed the risks of the procedure, including infection, bleeding, tissue injury, clip migration, and inadequate sampling. Informed written consent was given. The usual time-out protocol was performed immediately prior to the procedure. Lesion quadrant: Upper outer quadrant Using sterile technique and 1% Lidocaine as local anesthetic, under direct ultrasound visualization, a 14 gauge spring-loaded device was used to perform biopsy of 0.9 cm irregular mass at 11:30 position 7 cm from the nipple using a lateral approach. At the conclusion of the procedure a ribbon tissue marker clip was deployed into the biopsy cavity. Follow up 2 view mammogram was performed and dictated separately. IMPRESSION: Ultrasound guided biopsy of the right breast at 11 o'clock position. No apparent complications. Electronically Signed: By: Curlene Dolphin M.D. On: 04/28/2018 11:36   [place] Rt Breast Bx W Loc Dev Ea Add Lesion Img Bx Spec US Guide  Result Date: 04/28/2018 CLINICAL DATA:  Ultrasound-guided biopsy was recommended of an intramammary lymph node in the right breast at 10:30 position. EXAM: ULTRASOUND GUIDED RIGHT BREAST CORE NEEDLE BIOPSY COMPARISON:  Previous exam(s). FINDINGS: I met with the patient and we discussed the procedure of ultrasound-guided biopsy, including benefits and alternatives. We discussed the high likelihood of a successful procedure. We discussed the risks of the procedure, including infection, bleeding, tissue injury, clip migration, and inadequate sampling. Informed written consent was given. The usual time-out protocol was performed immediately prior to the procedure. Lesion quadrant: Upper outer quadrant Using sterile technique and 1% Lidocaine as local anesthetic, under direct ultrasound visualization, a 14 gauge spring-loaded device was used to perform biopsy of an intramammary lymph node with possible cortical thickening and hilar effacement at 10:30 position 7 cm from the  nipple using a lateral approach. At the conclusion of the procedure a heart shaped tissue marker clip was deployed into the biopsy cavity. Follow up 2 view mammogram was performed and dictated separately. IMPRESSION: Ultrasound guided biopsy of the right breast at 10:30 position 7 cm from the nipple. No apparent complications. Electronically Signed   By: Curlene Dolphin M.D.   On: 04/28/2018 11:37    Assessment and plan- Patient is a 72 y.o. female with prior history of triple negative left breast cancer in 2006 now presenting with new right breast primary stage I A c T1c c N0 c MX invasive mammary carcinoma, grade 1 ER PR positive and HER-2/neu negative  I discussed the results of the mammogram and pathology with the patient and her friend who is with her today.  Overall she had a 0.9 cm grade 1 ER PR positive HER-2/neu negative invasive mammary carcinoma noted in her right breast.  This is a second primary as her first breast cancer was 13 years ago and was a triple negative breast cancer grade 3 requiring chemotherapy and radiation treatment.  There was a suspicious intra-mammary lymph node noted on mammogram but biopsy did not reveal any malignancy.  At this time I would recommend that patient should see Dr. Bary Castilla for surgical consultation.  She is a poor chemotherapy candidate and will likely not require chemotherapy regardless given that her tumor is small, grade 1 and hormone positive which is a favorable biology.  I will see her back in about 2-1/2 weeks time tentatively after her surgery to discuss final pathology and hormone therapy.  I will obtain a baseline bone density scan in the interim.  I will also have her seen by Dr. Donella Stade at that time.  Treatment will be given with a  curative intent  Given that she has had 2 breast cancers as well as family history of breast cancer in her sister at the age of 84 she would qualify for genetic testing.  We will work on getting that approved by her  insurance at this time  Cancer Staging Malignant neoplasm of right female breast [facility]) Staging form: Breast, AJCC 8th Edition - Clinical stage from 05/13/2018: Stage IA (cT1b, cN0, cM0, G1, ER+, PR+, HER2-) - Signed by Sindy Guadeloupe, MD on 05/13/2018   Thank you for this kind referral and the opportunity to participate in the care of this patient   Visit Diagnosis 1. Malignant neoplasm of right female breast, unspecified estrogen receptor status, unspecified site of breast ([place])     Dr. Randa Evens, MD, MPH [facility] [phone] 05/13/2018  11:09 AM

## 2018-05-13 NOTE — Progress Notes (Signed)
Here  for new pt eval  

## 2018-05-14 ENCOUNTER — Encounter: Payer: Self-pay | Admitting: Radiology

## 2018-05-14 ENCOUNTER — Inpatient Hospital Stay: Payer: Medicare HMO

## 2018-05-14 ENCOUNTER — Telehealth: Payer: Self-pay | Admitting: Genetic Counselor

## 2018-05-14 NOTE — Telephone Encounter (Signed)
Dr. Janese Banks is referring Ms. Newhouse for genetic counseling due to a personal and family history of cancer.  Per her request, I called her daughter who wanted to schedule this telegenetics visit to be done by phone on Monday, 05/17/18 at 12pm.   Steele Berg, [place], [place] Genetic Counselor Phone: [phone]

## 2018-05-17 ENCOUNTER — Encounter: Payer: Self-pay | Admitting: Genetic Counselor

## 2018-05-17 ENCOUNTER — Telehealth: Payer: Self-pay | Admitting: Genetic Counselor

## 2018-05-17 NOTE — Telephone Encounter (Signed)
           Cancer Genetics            Telegenetics Initial Visit    Patient Name: [name] Patient DOB: 02/26/1946 Patient Age: 72 y.o. Phone Call Date: 05/17/2018  Referring Provider: Archana Rao, MD  Reason for Visit: Evaluate for hereditary susceptibility to cancer    Assessment and Plan:  . Ms. 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 Her mother died fairly young (age 57) and had only one sister.  . Testing is recommended to determine whether she has a pathogenic mutation that will impact her screening and risk-reduction for cancer. Results will also be very impactful to her son and daughter. A negative result will be generally reassuring.  . Ms. Whitmire already had her blood drawn for genetic testing when she saw Dr. Rao. It was sent to [facility] on 05/14/2018. The office ordered analysis of the 47 genes on [facility]'s Common Cancers panel (APC, ATM, AXIN2, BARD1, BMPR1A, BRCA1, BRCA2, BRIP1, CDH1, CDK4, CDKN2A, CHEK2, CTNNA1, DICER1, EPCAM, GREM1, HOXB13, KIT, MEN1, MLH1, MSH2, MSH3, MSH6, MUTYH, NBN, NF1, NTHL1, PALB2, PDGFRA, PMS2, POLD1, POLE, PTEN, RAD50, RAD51C, RAD51D, SDHA, SDHB, SDHC, SDHD, SMAD4, SMARCA4, STK11, TP53, TSC1, TSC2, VHL).  . Once the lab receives her specimen, results should be available in approximately 2-3 weeks, at which point we will contact her and address implications for her as well as address genetic testing for at-risk family members, if needed.    . Ms. Venezia already had her blood drawn wished to pursue genetic testing and a blood sample will be sent to [facility] for analysis. [facility]'s STAT breast panel was requested as it will impact surgical decisions. Results should be available in about 7-12 days. The 9 genes on this panel are ATM, BRCA1,  BRCA2, CDH1, CHEK2, PALB2, PTEN, STK11, TP53. Once this test is complete, analysis of additional genes on a larger hereditary cancer panel will proceed. She will be called after each result is obtained.   Dr. Finnegan was available for questions concerning this case. Total time spent by counseling by phone was approximately 25 minutes.   _____________________________________________________________________   History of Present Illness: Ms. Tiannah Meckel, a 72 y.o. female, was referred for genetic counseling to discuss the possibility of a hereditary predisposition to cancer and discuss whether genetic testing is warranted. This was a telegenetics visit via phone. She was joined by her daughter, Misty, for the duration of the call.  Ms. Fellner was recently diagnosed with right breast cancer at the age of 72. She will be meeting with Dr. Byrnette to discuss surgical options on 05/18/18. She has a history of left breast cancer at age 59 that was ER negative, PR negative, and HER2 negative. She had a lumpectomy and received radiation and chemotherapy.    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 Abnormality of gait   . Breast cancer (HCC) 2006   LT LUMPECTOMY  .   CHF (congestive heart failure) ([place])    1) echo 07/2011: EF 20-25%, mild concentric hypertrophy, diffuse HK, regional WMA cannot be excluded, grade 1 DD, mitral valve with mild regurg, LA mildly dilated). 2) EF 40-45%, mild AS,                    . Edema   . GERD (gastroesophageal reflux disease)   . Hypertension   . Hypokinesis    global, EF 35-45 % from Echo.  . Malignant neoplasm of breast (female),  unspecified site    left  . Neuropathy   . Obesity   . Pain in joint, site unspecified   . Sleep apnea   . Tinea pedis   . Unspecified hearing loss    bilateral    Past Surgical History:  Procedure Laterality Date  . BREAST LUMPECTOMY  2006   left breast   . CATARACT EXTRACTION     left eye  . HERNIA REPAIR    . TUBAL LIGATION      Family History: Significant diagnoses include the following:  Family History  Problem Relation Age of Onset  . Hypertension Mother        deceased 37  . Cataracts Mother   . Breast cancer Other 71       maternal half-sister; deceased 70  . Coronary artery disease Other     Additionally, Ms. Laperle has a son (age 55) and a daughter (age 21). She has no full siblings. She has 3 maternal half-sisters and a half-brother. She has no information about her father or any of his family members.  Ms. Jehle ancestry is African American. There is no known Jewish ancestry and no consanguinity.  Discussion: We reviewed the characteristics, features and inheritance patterns of hereditary cancer syndromes. We discussed her risk of harboring a mutation in the context of her personal and family history. We discussed that her unknown paternal family makes risk assessment challenging. We discussed the process of genetic testing, insurance coverage and implications of results: positive, negative and variant of uncertain significance (VUS).   Ms. Dewalt and her daughter's questions were answered to her satisfaction today and they are welcome to call with any additional questions or concerns. Thank you for the referral and allowing [place] to share in the care of your patient.    Steele Berg, MS, [place] Certified Genetic Counselor phone: [phone]

## 2018-05-18 ENCOUNTER — Ambulatory Visit (INDEPENDENT_AMBULATORY_CARE_PROVIDER_SITE_OTHER): Payer: Medicare HMO

## 2018-05-18 ENCOUNTER — Ambulatory Visit (INDEPENDENT_AMBULATORY_CARE_PROVIDER_SITE_OTHER): Payer: Medicare HMO | Admitting: General Surgery

## 2018-05-18 ENCOUNTER — Encounter: Payer: Self-pay | Admitting: General Surgery

## 2018-05-18 VITALS — BP 162/74 | HR 74 | Temp 97.5°F | Resp 18 | Ht 69.0 in | Wt 286.2 lb

## 2018-05-18 DIAGNOSIS — Z170 Estrogen receptor positive status [ER+]: Secondary | ICD-10-CM

## 2018-05-18 DIAGNOSIS — C50411 Malignant neoplasm of upper-outer quadrant of right female breast: Secondary | ICD-10-CM

## 2018-05-18 NOTE — Progress Notes (Signed)
Patient ID: [name], female   DOB: 1945/12/23, 72 y.o.   MRN: [id number]  Chief Complaint  Patient presents with  . New Patient (Initial Visit)    Invasive mammary carcinoma    HPI [name] is a 72 y.o. female. Here today for right breast invasive mammary carcinoma.The most recent mammogram was done on 04/02/2018 added vies on 04/20/2018 Right breast biopsy was on 04/28/2018.    Patient complains of nipple soreness, itching. No skin changes or dimpling. No nipple drainage. Lump found by yearly mammogram.  Patient doesn't perform regular self breast checks and gets regular mammograms done.  HPI  Past Medical History:  Diagnosis Date  . Abnormality of gait   . Breast cancer ([place]) 02/05/2005   3.5 cm invasive mammary carcinoma, T2, N0,; triple negative, whole breast radiation, cytoxan, taxotere chemotherapy.   . CHF (congestive heart failure) ([place])    1) echo 07/2011: EF 20-25%, mild concentric hypertrophy, diffuse HK, regional WMA cannot be excluded, grade 1 DD, mitral valve with mild regurg, LA mildly dilated). 2) EF 40-45%, mild AS,                    . Edema   . GERD (gastroesophageal reflux disease)   . Hypertension   . Hypokinesis    global, EF 35-45 % from Echo.  . Malignant neoplasm of breast (female), unspecified site    left  . Neuropathy   . Obesity   . Pain in joint, site unspecified   . Sleep apnea   . Tinea pedis   . Unspecified hearing loss    bilateral    Past Surgical History:  Procedure Laterality Date  . BREAST LUMPECTOMY  2006   left breast   . CATARACT EXTRACTION     left eye  . HERNIA REPAIR    . TUBAL LIGATION      Family History  Problem Relation Age of Onset  . Hypertension Mother        deceased 62  . Cataracts Mother   . Breast cancer Other 18       maternal half-sister; deceased 11  . Coronary artery disease Other     Social History Social History   Tobacco Use  . Smoking status: Former Smoker    Packs/day: 0.50     Years: 30.00    Pack years: 15.00    Types: Cigarettes    Last attempt to quit: 07/14/2000    Years since quitting: 17.8  . Smokeless tobacco: Never Used  Substance Use Topics  . Alcohol use: No  . Drug use: No    No Known Allergies  Current Outpatient Medications  Medication Sig Dispense Refill  . aspirin EC 81 MG tablet Take 81 mg by mouth daily.      . cloNIDine (CATAPRES) 0.2 MG tablet Take 1 tablet (0.2 mg total) by mouth 2 (two) times daily. 60 tablet 11  . clotrimazole (LOTRIMIN) 1 % cream APPLY TO AFFECTED AREA TWICE DAILY FOR FUNGAL INFECTION  1  . losartan (COZAAR) 25 MG tablet Take 25 mg by mouth daily.     Marland Kitchen omeprazole (PRILOSEC) 20 MG capsule Take 20 mg by mouth daily.    Marland Kitchen oxybutynin (DITROPAN) 5 MG tablet Take 5 mg by mouth 2 (two) times daily.    . potassium chloride SA (K-DUR,KLOR-CON) 20 MEQ tablet Take 1 tablet (20 mEq total) by mouth 2 (two) times daily. 60 tablet 6  . isosorbide mononitrate (IMDUR) 60 MG 24  hr tablet Take 1 tablet (60 mg total) by mouth 2 (two) times daily. 180 tablet 3  . torsemide (DEMADEX) 20 MG tablet Take 1 tablet (20 mg total) by mouth 2 (two) times daily. 60 tablet 6   No current facility-administered medications for this visit.     Review of Systems Review of Systems  Constitutional: Negative.   HENT: Positive for hearing loss.   Respiratory: Negative.   Cardiovascular: Negative.   Neurological: Negative.     Blood pressure (!) 162/74, pulse 74, temperature (!) 97.5 F (36.4 C), temperature source Temporal, resp. rate 18, height 5' 9"  (1.753 m), weight 286 lb 3.2 oz (129.8 kg), SpO2 96 %.  Physical Exam Physical Exam  Constitutional: She is oriented to person, place, and time. She appears well-developed and well-nourished.  Eyes: Conjunctivae are normal. No scleral icterus.  Neck: Normal range of motion.  Cardiovascular: Regular rhythm.  Murmur heard.  Systolic murmur is present with a grade of 2/6. Pulmonary/Chest: Effort  normal and breath sounds normal. She exhibits no mass and no tenderness. Right breast exhibits no inverted nipple, no mass, no nipple discharge, no skin change and no tenderness. Left breast exhibits no inverted nipple, no mass, no nipple discharge, no skin change and no tenderness.    Lymphadenopathy:    She has no cervical adenopathy.    She has no axillary adenopathy.       Right: No supraclavicular adenopathy present.       Left: No supraclavicular adenopathy present.  Neurological: She is alert and oriented to person, place, and time.  Skin: Skin is warm and dry.  Psychiatric: She has a normal mood and affect.    Data Reviewed Mammograms of July 2018 through August 2019 reviewed.  Diagnostic right breast mammogram of April 20, 2018 showed a spiculated mass.  9 mm on ultrasound, 11 o'clock position, 7 cm from the nipple.  Ultrasound-guided biopsy completed April 28, 2018:  A. BREAST, RIGHT, 11:00 7 CM FROM NIPPLE; ULTRASOUND-GUIDED CORE BIOPSY:  - INVASIVE MAMMARY CARCINOMA, NO SPECIAL TYPE.  Size of invasive carcinoma: 6 mm in this sample  Histologic grade of invasive carcinoma: Grade 1            Glandular/tubular differentiation score: 3            Nuclear pleomorphism score: 1            Mitotic rate score: 1            Total score: 5  Ductal carcinoma in situ: Not identified  Lymphovascular invasion: Not identified   BREAST BIOMARKER TESTS  Estrogen Receptor (ER) Status: POSITIVE  Percentage of cells with nuclear positivity: >90%  Average intensity of staining: Strong   Progesterone Receptor (PgR) Status: POSITIVE  Percentage of cells with nuclear positivity: >90%  Average intensity of staining: Strong   HER2 (by immunohistochemistry): NEGATIVE (score 1+)   B. BREAST, RIGHT, 10:30 7 CM FROM NIPPLE; ULTRASOUND-GUIDED CORE BIOPSY:  - INTRAMAMMARY LYMPH NODE, NEGATIVE FOR MALIGNANCY.   2006 pathology reviewed: 3.5 cm  triple negative tumor.  Cytoxan/Taxotere administered.  Test radiation.  Ultrasound examination of the right breast was completed to determine if preoperative wire localization would be required. In the right breast at the 10 o'clock position, 10 cm from the nipple a hypoechoic mass measuring 0.5 x 0.55 x 0.57 cm was noted with an enclosed clip.  This is just 1 cm below the skin surface.  Based on the superficial location and the  distance from the nipple (after reviewing post biopsy mammograms) I believe this represents the primary site.  The central lymph node was far deeper within the breast parenchyma.  BI-RADS-6  Assessment    Clinical stage I carcinoma of the right breast.  History of congestive failure with normal cardiac echo July 2018.  Cardiology evaluation appropriate.  Patient did well with breast conservation in 2006 and is interested in proceeding with same on the contralateral side.    Plan    The patient has significant mobility issues, but I think would do well with breast conservation.  As she uses a walker and requires significant upper body strength, mastectomy would be a less ideal choice.  We spent about 40 minutes reviewing options for management.  I anticipate her upcoming cardiac clearance will be okay, but it will be important to clear this based on her past history of congestive failure in 2012.   Cardiac Clearance Dr.Gollan. We will schedule your surgery after Clearance is received.  We will call Dr.Gollan for an appointment.      HPI, Physical Exam, Assessment and Plan have been scribed under the direction and in the presence of Robert Bellow, MD. Jonnie Finner, CMA I have completed the exam and reviewed the above documentation for accuracy and completeness.  I agree with the above.  Haematologist has been used and any errors in dictation or transcription are unintentional.  Hervey Ard, M.D., F.A.C.S.   Forest Gleason Byrnett 05/19/2018, 7:00  AM

## 2018-05-18 NOTE — Patient Instructions (Signed)
The patient is aware to call back for any questions or concerns.  

## 2018-05-18 NOTE — Progress Notes (Deleted)
Patient ID: [name], female   DOB: 1946-06-26, 72 y.o.   MRN: [id number]  Chief Complaint  Patient presents with  . Breast Cancer    HPI [name] is a 72 y.o. female who presents for a breast evaluation. The most recent mammogram was done on 04/02/2018 added vies on 04/20/2018 Right breast biopsy was on 04/28/2018.  Patient does perform regular self breast checks and gets regular mammograms done.    HPI  Past Medical History:  Diagnosis Date  . Abnormality of gait   . Breast cancer ([place]) 2006   LT LUMPECTOMY  . CHF (congestive heart failure) ([place])    1) echo 07/2011: EF 20-25%, mild concentric hypertrophy, diffuse HK, regional WMA cannot be excluded, grade 1 DD, mitral valve with mild regurg, LA mildly dilated). 2) EF 40-45%, mild AS,                    . Edema   . GERD (gastroesophageal reflux disease)   . Hypertension   . Hypokinesis    global, EF 35-45 % from Echo.  . Malignant neoplasm of breast (female), unspecified site    left  . Neuropathy   . Obesity   . Pain in joint, site unspecified   . Sleep apnea   . Tinea pedis   . Unspecified hearing loss    bilateral    Past Surgical History:  Procedure Laterality Date  . BREAST LUMPECTOMY  2006   left breast   . CATARACT EXTRACTION     left eye  . HERNIA REPAIR    . TUBAL LIGATION      Family History  Problem Relation Age of Onset  . Hypertension Mother        deceased 75  . Cataracts Mother   . Breast cancer Other 33       maternal half-sister; deceased 18  . Coronary artery disease Other     Social History Social History   Tobacco Use  . Smoking status: Former Smoker    Packs/day: 0.50    Years: 30.00    Pack years: 15.00    Types: Cigarettes    Last attempt to quit: 07/14/2000    Years since quitting: 17.8  . Smokeless tobacco: Never Used  Substance Use Topics  . Alcohol use: No  . Drug use: No    No Known Allergies  Current Outpatient Medications  Medication Sig Dispense  Refill  . aspirin EC 81 MG tablet Take 81 mg by mouth daily.      . cloNIDine (CATAPRES) 0.2 MG tablet Take 1 tablet (0.2 mg total) by mouth 2 (two) times daily. 60 tablet 11  . clotrimazole (LOTRIMIN) 1 % cream APPLY TO AFFECTED AREA TWICE DAILY FOR FUNGAL INFECTION  1  . losartan (COZAAR) 25 MG tablet Take 25 mg by mouth daily.     Marland Kitchen omeprazole (PRILOSEC) 20 MG capsule Take 20 mg by mouth daily.    Marland Kitchen oxybutynin (DITROPAN) 5 MG tablet Take 5 mg by mouth 2 (two) times daily.    . potassium chloride SA (K-DUR,KLOR-CON) 20 MEQ tablet Take 1 tablet (20 mEq total) by mouth 2 (two) times daily. 60 tablet 6  . isosorbide mononitrate (IMDUR) 60 MG 24 hr tablet Take 1 tablet (60 mg total) by mouth 2 (two) times daily. 180 tablet 3  . torsemide (DEMADEX) 20 MG tablet Take 1 tablet (20 mg total) by mouth 2 (two) times daily. 60 tablet 6   No current  facility-administered medications for this visit.     Review of Systems Review of Systems  Constitutional: Negative.   Respiratory: Negative.   Cardiovascular: Negative.     There were no vitals taken for this visit.  Physical Exam Physical Exam  Constitutional: She is oriented to person, place, and time. She appears well-developed and well-nourished.  Eyes: Conjunctivae are normal. No scleral icterus.  Neck: Neck supple.  Cardiovascular: Normal rate, regular rhythm and normal heart sounds.  Pulmonary/Chest: Effort normal and breath sounds normal. Right breast exhibits no inverted nipple, no mass, no nipple discharge, no skin change and no tenderness. Left breast exhibits no inverted nipple, no mass, no nipple discharge, no skin change and no tenderness.  Lymphadenopathy:    She has no cervical adenopathy.    She has no axillary adenopathy.  Neurological: She is alert and oriented to person, place, and time.  Skin: Skin is warm and dry.    Data Reviewed ***  Assessment    ***    Plan     Gaspar Cola 05/18/2018, 4:43 PM

## 2018-05-19 ENCOUNTER — Telehealth: Payer: Self-pay | Admitting: Cardiovascular Disease

## 2018-05-19 ENCOUNTER — Telehealth: Payer: Self-pay

## 2018-05-19 ENCOUNTER — Encounter: Payer: Self-pay | Admitting: General Surgery

## 2018-05-19 NOTE — Telephone Encounter (Signed)
Cardiac clearance faxed to Dr.Gollan at this time per Dr.Gollan.   Also sent referral to the above.

## 2018-05-19 NOTE — Telephone Encounter (Signed)
° °  [facility] Pre-operative Risk Assessment    Request for surgical clearance:  1. What type of surgery is being performed? Right Breast Lumpectomy   2. When is this surgery scheduled?   3. What type of clearance is required (medical clearance vs. Pharmacy clearance to hold med vs. Both)? Medical   4. Are there any medications that need to be held prior to surgery and how long?  5. Practice name and name of physician performing surgery? [facility] Dr Bary Castilla   6. What is your office phone number [phone]   7.   What is your office fax number [phone]   8.   Anesthesia type (None, local, MAC, general) ?     Attempted to also call patient to schedule follow up appointment Her VM was full and could not take calls  Will try again to schedule appointment

## 2018-05-20 ENCOUNTER — Encounter: Payer: Self-pay | Admitting: General Surgery

## 2018-05-20 ENCOUNTER — Telehealth: Payer: Self-pay

## 2018-05-20 NOTE — Telephone Encounter (Signed)
Scheduled

## 2018-05-20 NOTE — Telephone Encounter (Signed)
Checked on Cardiac clearance at this time. I noticed a note in her chart that a nurse from [facility] office had tried reaching the patient and was unable to leave a message.  I spoke with patient and asked her to return call to [facility] office to schedule an appointment, his number was provided to the patient and she stated she would call the office this morning.

## 2018-05-23 NOTE — Telephone Encounter (Signed)
I sent a message to byrnette Acceptable risk for surgery Needs to take her BP meds morning of procedure

## 2018-05-24 ENCOUNTER — Telehealth: Payer: Self-pay

## 2018-05-24 ENCOUNTER — Telehealth: Payer: Self-pay | Admitting: *Deleted

## 2018-05-24 DIAGNOSIS — C50411 Malignant neoplasm of upper-outer quadrant of right female breast: Secondary | ICD-10-CM

## 2018-05-24 DIAGNOSIS — Z170 Estrogen receptor positive status [ER+]: Principal | ICD-10-CM

## 2018-05-24 NOTE — Telephone Encounter (Signed)
Tried to reach patient on her cell phone this evening but no answer and not able to leave a message.   We can go ahead and arrange for right breast wide excision. Patient will not require a pre-op visit with Dr. Bary Castilla. History and physical will be updated the morning of procedure. It is okay for patient to continue an 81 mg aspirin once daily.   The patient will need to have an in-office Pre-admit appointment arranged.

## 2018-05-24 NOTE — Telephone Encounter (Signed)
Cardiac Clearance received from [facility] at this time. Per Dr.Gollan - Acceptable risk for surgery. Needs to take BP medication the morning of surgery.

## 2018-05-25 ENCOUNTER — Ambulatory Visit: Payer: Medicare HMO | Admitting: Nurse Practitioner

## 2018-05-25 DIAGNOSIS — R0989 Other specified symptoms and signs involving the circulatory and respiratory systems: Secondary | ICD-10-CM

## 2018-05-25 NOTE — Telephone Encounter (Signed)
Spoke with the patient and her daughter about scheduling surgery. She is scheduled for surgery at [facility] with Dr Bary Castilla on 06/14/18. She will pre admit at the hospital on 06/07/18. We will call the patient with her pre admit date and time and her surgery arrival time and location. She is aware of dates and instructions.

## 2018-05-26 ENCOUNTER — Encounter: Payer: Self-pay | Admitting: Nurse Practitioner

## 2018-05-27 ENCOUNTER — Telehealth: Payer: Self-pay

## 2018-05-27 ENCOUNTER — Other Ambulatory Visit: Payer: Self-pay | Admitting: General Surgery

## 2018-05-27 DIAGNOSIS — C50411 Malignant neoplasm of upper-outer quadrant of right female breast: Secondary | ICD-10-CM

## 2018-05-27 DIAGNOSIS — Z170 Estrogen receptor positive status [ER+]: Principal | ICD-10-CM

## 2018-05-27 NOTE — Telephone Encounter (Signed)
Call to patient to notify of surgery day arrival time and location and also for her pre admit date and time. Unable to leave a message due to no voicemail.   The patient is to report to the [facility] the morning of her surgery on 06/14/18 at 7:45 am. She will pre admit at the hospital on 06/07/18 at 9:45 am.

## 2018-05-28 NOTE — Telephone Encounter (Signed)
I spoke with the patient and notified her of pre admit date and time and surgery location and time. She may have her daughter call back later to verify this information.

## 2018-05-31 ENCOUNTER — Telehealth: Payer: Self-pay | Admitting: Genetic Counselor

## 2018-05-31 NOTE — Telephone Encounter (Signed)
Cancer Genetics             Telegenetics Results Disclosure   Patient Name: Aashna Matson Patient DOB: 24-Feb-1946 Patient Age: 72 y.o. Phone Call Date: 05/31/2018  Referring Provider: Randa Evens, MD    Ms. Ospina was called today to discuss genetic test results. Please see the Genetics telephone note from 05/17/2018 for a detailed discussion of her personal and family histories and the recommendations provided. The information below was discussed with her and her daughter, Rojelio Brenner.  Genetic Testing: At the time of Ms. Corum telegenetics visit, she decided to pursue genetic testing of multiple genes associated with hereditary susceptibility to cancer. Testing included sequencing and deletion/duplication analysis. Testing did not reveal a pathogenic mutation in any of the genes analyzed.  A copy of the genetic test report will be scanned into Epic under the Media tab.  Interpretation: These results suggest that Ms. Snader's cancer was most likely not due to an inherited predisposition. Most cancers happen by chance and this test, along with details of her family history, suggests that her cancer falls into this category.   Since the current test is not perfect, it is possible that there may be a gene mutation that current testing cannot detect, but that chance is small. It is possible that a different genetic factor, which has not yet been discovered or is not on this panel, is responsible for the cancer diagnoses in the family. Again, the likelihood of this is low. Lastly, it may be that there is a detectable mutation in her family that she did not inherit. No additional testing is recommended at this time for Ms. Odom.  Genes Analyzed: The genes analyzed were the 47 genes on [facility]'s Common Cancers panel (APC, ATM, AXIN2, BARD1, BMPR1A, BRCA1, BRCA2, BRIP1, CDH1, CDK4, CDKN2A, CHEK2, CTNNA1, DICER1, EPCAM, GREM1, HOXB13, KIT, MEN1, MLH1, MSH2, MSH3, MSH6,  MUTYH, NBN, NF1, NTHL1, PALB2, PDGFRA, PMS2, POLD1, POLE, PTEN, RAD50, RAD51C, RAD51D, SDHA, SDHB, SDHC, SDHD, SMAD4, SMARCA4, STK11, TP53, TSC1, TSC2, VHL).  Cancer Screening: Ms. Hackert is recommended to follow the cancer screening guidelines provided by her physicians.   Family Members: Family members are at some increased risk of developing cancer, over the general population risk, simply due to the family history. They are recommended to speak with their own providers about appropriate cancer screenings.   Any relative who had cancer at a young age or had a particularly rare cancer may also wish to pursue genetic testing. Genetic counselors can be located in other cities, by visiting the website of the [employer] (ArtistMovie.se) and Field seismologist for a Dietitian by zip code.     Lastly, cancer genetics is a rapidly advancing field and it is possible that new genetic tests will be appropriate for Ms. Kaffenberger in the future. Ms. Friedl is encouraged to remain in contact with Genetics on an annual basis so we can update her personal and family histories, and let her know of advances in cancer genetics that may benefit the family. Ms. Schlick questions were answered to her satisfaction today, and she knows she is welcome to call anytime with additional questions.    Steele Berg, MS, [place] Certified Genetic Counselor phone: [phone]

## 2018-06-02 ENCOUNTER — Ambulatory Visit
Admission: RE | Admit: 2018-06-02 | Discharge: 2018-06-02 | Disposition: A | Discharge status: Home / Self Care | Payer: Medicare HMO | Source: Ambulatory Visit | Attending: Oncology | Admitting: Oncology

## 2018-06-02 DIAGNOSIS — J449 Chronic obstructive pulmonary disease, unspecified: Secondary | ICD-10-CM | POA: Diagnosis not present

## 2018-06-02 DIAGNOSIS — Z13820 Encounter for screening for osteoporosis: Secondary | ICD-10-CM | POA: Insufficient documentation

## 2018-06-02 DIAGNOSIS — C50911 Malignant neoplasm of unspecified site of right female breast: Secondary | ICD-10-CM | POA: Diagnosis present

## 2018-06-02 DIAGNOSIS — Z780 Asymptomatic menopausal state: Secondary | ICD-10-CM | POA: Insufficient documentation

## 2018-06-03 ENCOUNTER — Inpatient Hospital Stay (HOSPITAL_BASED_OUTPATIENT_CLINIC_OR_DEPARTMENT_OTHER): Payer: Medicare HMO | Admitting: Oncology

## 2018-06-03 ENCOUNTER — Ambulatory Visit: Admission: RE | Admit: 2018-06-03 | Payer: Medicare HMO | Source: Ambulatory Visit | Admitting: Radiation Oncology

## 2018-06-03 ENCOUNTER — Encounter: Payer: Self-pay | Admitting: Oncology

## 2018-06-03 VITALS — BP 118/73 | HR 62 | Temp 97.7°F | Resp 18 | Ht 69.0 in | Wt 266.6 lb

## 2018-06-03 DIAGNOSIS — Z170 Estrogen receptor positive status [ER+]: Secondary | ICD-10-CM

## 2018-06-03 DIAGNOSIS — C50411 Malignant neoplasm of upper-outer quadrant of right female breast: Secondary | ICD-10-CM | POA: Diagnosis not present

## 2018-06-03 DIAGNOSIS — Z87891 Personal history of nicotine dependence: Secondary | ICD-10-CM | POA: Diagnosis not present

## 2018-06-03 DIAGNOSIS — I10 Essential (primary) hypertension: Secondary | ICD-10-CM | POA: Diagnosis not present

## 2018-06-03 DIAGNOSIS — Z803 Family history of malignant neoplasm of breast: Secondary | ICD-10-CM

## 2018-06-03 DIAGNOSIS — C50911 Malignant neoplasm of unspecified site of right female breast: Secondary | ICD-10-CM

## 2018-06-03 NOTE — Progress Notes (Signed)
Hematology/Oncology Consult note [facility]  [phone] [phone]  Patient Care Team: Donnie Coffin, MD as PCP - General (Family Medicine) Minna Merritts, MD as Consulting Physician (Cardiology)   Name of the patient: [name]  [id number]  July 09, 1946   Date of visit: 06/03/18  Diagnosis- stage I A c T1c c N0 c MX invasive mammary carcinoma, grade 1 ER PR positive and HER-2/neu negative. Surgery pending  Chief complaint/ Reason for visit- routine f/u of breast cancer  Heme/Onc history: patient is a 72 year old female with a prior history of left breast cancer in 2006.  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  She has been in remission since then.  She has been getting periodic mammograms and the most recent mammogram and ultrasound in August 2019 showed 0.9 x 0.9 x 0.8 cm irregular spiculated mass in the 11 o'clock position of the right breast.  There was a concern for an abnormal intramammary lymph node as well.  No other abnormal axillary adenopathy noted.  Patient underwent ultrasound-guided biopsy of both the right breast mass as well as the axillary lymph node lymph node biopsy was negative for malignancy.  Biopsy of the right breast mass showed 6 mm, grade 1 ER PR positive and HER-2/neu negative tumor.  Patient is morbidly obese and has difficulty with ambulation.  She uses a cane or a walker to ambulate around the house.  She sees Dr. Rockey Situ for her cardiology issues.  Reports that her appetite is good and she denies any unintentional weight loss or any new aches or pains anywhere.  Family history significant for breast cancer in her sister who died from breast cancer in her 64s.  Patient does not remember getting genetic testing done.   Interval history- no new changes since last visit. Feels fatigued and has chronic leg swelling  ECOG PS- 2 Pain scale- 4 Opioid  associated constipation- no  Review of systems- Review of Systems  Constitutional: Positive for malaise/fatigue. Negative for chills, fever and weight loss.  HENT: Negative for congestion, ear discharge and nosebleeds.   Eyes: Negative for blurred vision.  Respiratory: Negative for cough, hemoptysis, sputum production, shortness of breath and wheezing.   Cardiovascular: Positive for leg swelling. Negative for chest pain, palpitations, orthopnea and claudication.  Gastrointestinal: Negative for abdominal pain, blood in stool, constipation, diarrhea, heartburn, melena, nausea and vomiting.  Genitourinary: Negative for dysuria, flank pain, frequency, hematuria and urgency.  Musculoskeletal: Positive for back pain. Negative for joint pain and myalgias.  Skin: Negative for rash.  Neurological: Negative for dizziness, tingling, focal weakness, seizures, weakness and headaches.  Endo/Heme/Allergies: Does not bruise/bleed easily.  Psychiatric/Behavioral: Negative for depression and suicidal ideas. The patient does not have insomnia.       No Known Allergies   Past Medical History:  Diagnosis Date  . Abnormality of gait   . Breast cancer ([place]) 02/05/2005   3.5 cm invasive mammary carcinoma, T2, N0,; triple negative, whole breast radiation, cytoxan, taxotere chemotherapy.   . CHF (congestive heart failure) ([place])    March 28, 2017 echo: EF 50-55%; 1) echo 07/2011: EF 20-25%, mild concentric hypertrophy, diffuse HK, regional WMA cannot be excluded, grade 1 DD, mitral valve with mild regurg, LA mildly dilated). 2) EF 40-45%, mild AS,                    . Edema   . GERD (gastroesophageal  reflux disease)   . Hypertension   . Hypokinesis    global, EF 35-45 % from Echo.  . Malignant neoplasm of breast (female), unspecified site    left  . Neuropathy   . Obesity   . Pain in joint, site unspecified   . Sleep apnea   . Tinea pedis   . Unspecified hearing loss    bilateral     Past Surgical  History:  Procedure Laterality Date  . BREAST LUMPECTOMY  2006   left breast   . CATARACT EXTRACTION     left eye  . HERNIA REPAIR    . TUBAL LIGATION      Social History   Socioeconomic History  . Marital status: Divorced    Spouse name: Not on file  . Number of children: Not on file  . Years of education: Not on file  . Highest education level: Not on file  Occupational History  . Occupation: retired  Scientific laboratory technician  . Financial resource strain: Not on file  . Food insecurity:    Worry: Not on file    Inability: Not on file  . Transportation needs:    Medical: Not on file    Non-medical: Not on file  Tobacco Use  . Smoking status: Former Smoker    Packs/day: 0.50    Years: 30.00    Pack years: 15.00    Types: Cigarettes    Last attempt to quit: 07/14/2000    Years since quitting: 17.8  . Smokeless tobacco: Never Used  Substance and Sexual Activity  . Alcohol use: No  . Drug use: No  . Sexual activity: Never  Lifestyle  . Physical activity:    Days per week: Not on file    Minutes per session: Not on file  . Stress: Not on file  Relationships  . Social connections:    Talks on phone: Not on file    Gets together: Not on file    Attends religious service: Not on file    Active member of club or organization: Not on file    Attends meetings of clubs or organizations: Not on file    Relationship status: Not on file  . Intimate partner violence:    Fear of current or ex partner: Not on file    Emotionally abused: Not on file    Physically abused: Not on file    Forced sexual activity: Not on file  Other Topics Concern  . Not on file  Social History Narrative  . Not on file    Family History  Problem Relation Age of Onset  . Hypertension Mother        deceased 71  . Cataracts Mother   . Breast cancer Other 55       maternal half-sister; deceased 32  . Coronary artery disease Other      Current Outpatient Medications:  .  aspirin EC 81 MG tablet,  Take 81 mg by mouth daily.  , Disp: , Rfl:  .  clotrimazole (LOTRIMIN) 1 % cream, Apply 1 application topically 2 (two) times daily. , Disp: , Rfl: 1 .  isosorbide mononitrate (IMDUR) 60 MG 24 hr tablet, Take 1 tablet (60 mg total) by mouth 2 (two) times daily., Disp: 180 tablet, Rfl: 3 .  losartan (COZAAR) 25 MG tablet, Take 25 mg by mouth daily. , Disp: , Rfl:  .  omeprazole (PRILOSEC) 20 MG capsule, Take 20 mg by mouth daily., Disp: , Rfl:  .  oxybutynin (DITROPAN) 5 MG tablet, Take 5 mg by mouth 2 (two) times daily., Disp: , Rfl:  .  potassium chloride SA (K-DUR,KLOR-CON) 20 MEQ tablet, Take 1 tablet (20 mEq total) by mouth 2 (two) times daily., Disp: 60 tablet, Rfl: 6 .  torsemide (DEMADEX) 20 MG tablet, Take 1 tablet (20 mg total) by mouth 2 (two) times daily., Disp: 60 tablet, Rfl: 6 .  cloNIDine (CATAPRES) 0.2 MG tablet, Take 1 tablet (0.2 mg total) by mouth 2 (two) times daily. (Patient not taking: Reported on 06/02/2018), Disp: 60 tablet, Rfl: 11  Physical exam:  Vitals:   06/03/18 0959  BP: 118/73  Pulse: 62  Resp: 18  Temp: 97.7 F (36.5 C)  TempSrc: Oral  SpO2: 96%  Weight: 266 lb 9.6 oz (120.9 kg)  Height: 5' 9"  (1.753 m)   Physical Exam  Constitutional: She is oriented to person, place, and time.  Elderly obese woman in no acute distress  HENT:  Head: Normocephalic and atraumatic.  Eyes: Pupils are equal, round, and reactive to light. EOM are normal.  Neck: Normal range of motion.  Cardiovascular: Normal rate, regular rhythm and normal heart sounds.  Pulmonary/Chest: Effort normal and breath sounds normal.  Abdominal: Soft. Bowel sounds are normal.  Musculoskeletal: She exhibits edema.  Neurological: She is alert and oriented to person, place, and time.  Skin: Skin is warm and dry.     CMP Latest Ref Rng & Units 11/20/2017  Glucose 65 - 99 mg/dL 88  BUN 8 - 27 mg/dL 14  Creatinine 0.57 - 1.00 mg/dL 0.70  Sodium 134 - 144 mmol/L 147(H)  Potassium 3.5 - 5.2  mmol/L 3.7  Chloride 96 - 106 mmol/L 103  CO2 20 - 29 mmol/L 29  Calcium 8.7 - 10.3 mg/dL 9.2  Total Protein 6.5 - 8.1 g/dL -  Total Bilirubin 0.3 - 1.2 mg/dL -  Alkaline Phos 38 - 126 U/L -  AST 15 - 41 U/L -  ALT 14 - 54 U/L -   CBC Latest Ref Rng & Units 03/28/2017  WBC 3.6 - 11.0 K/uL 7.2  Hemoglobin 12.0 - 16.0 g/dL 11.9(L)  Hematocrit 35.0 - 47.0 % 35.3  Platelets 150 - 440 K/uL 238    No images are attached to the encounter.  Dg Bone Density  Result Date: 06/02/2018 EXAM: DUAL X-RAY ABSORPTIOMETRY (DXA) FOR BONE MINERAL DENSITY IMPRESSION: Dear Dr Janese Banks, Your patient Shirelle Tootle completed a BMD test on 06/02/2018 using the [employer] (analysis version: 14.10) manufactured by [employer]. The following summarizes the results of our evaluation. PATIENT BIOGRAPHICAL: Name: Nahlia, Hellmann Patient ID: [id number] Birth Date: 1946/06/08 Height: 69.0 in. Gender: Female Exam Date: 06/02/2018 Weight: 286.2 lbs. Indications: Advanced Age, Breast CA, COPD, History of Chemo, Postmenopausal Fractures: Treatments: Prilosec ASSESSMENT: The BMD measured at Femur Neck Left is 0.966 g/cm2 with a T-score of -0.5. This patient is considered normal according to [facility]) criteria. L-2 was excluded due to degenerative changes. Site Region Measured Measured WHO Young Adult BMD Date       Age      Classification T-score AP Spine L1-L4 (L2) 6/02/2018 72.5 Normal 0.2 1.212 g/cm2 DualFemur Neck Left 06/02/2018 72.5 Normal -0.5 0.966 g/cm2 DualFemur Total Mean 06/02/2018 72.5 Normal -0.1 1.001 g/cm2 World Health Organization [facility]) criteria for post-menopausal, Caucasian Women: Normal:       T-score at or above -1 SD Osteopenia:   T-score between -1 and -2.5 SD Osteoporosis: T-score at or  below -2.5 SD RECOMMENDATIONS: 1. All patients should optimize calcium and vitamin D intake. 2. Consider FDA-approved medical therapies in postmenopausal women and men aged 51 years and older,  based on the following: a. A hip or vertebral(clinical or morphometric) fracture b. T-score < -2.5 at the femoral neck or spine after appropriate evaluation to exclude secondary causes c. Low bone mass (T-score between -1.0 and -2.5 at the femoral neck or spine) and a 10-year probability of a hip fracture > 3% or a 10-year probability of a major osteoporosis-related fracture > 20% based on the [place]-adapted WHO algorithm d. Clinician judgment and/or patient preferences may indicate treatment for people with 10-year fracture probabilities above or below these levels FOLLOW-UP: Patients with diagnosis of osteoporosis or at high risk for fracture should have regular bone mineral density tests. For patients eligible for Medicare, routine testing is allowed once every 2 years. The testing frequency can be increased to one year for patients who have rapidly progressing disease, those who are receiving or discontinuing medical therapy to restore bone mass, or have additional risk factors. I have reviewed this report, and agree with the above findings. [facility] Radiology Electronically Signed   By: Lowella Grip III M.D.   On: 06/02/2018 14:52   US Breast Complete Uni Right Inc Axilla  Result Date: 05/19/2018 Ultrasound examination of the right breast was completed to determine if preoperative wire localization would be required. In the right breast at the 10 o'clock position, 10 cm from the nipple a hypoechoic mass measuring 0.5 x 0.55 x 0.57 cm was noted with an enclosed clip.  This is just 1 cm below the skin surface.  Based on the superficial location and the distance from the nipple (after reviewing post biopsy mammograms) I believe this represents the primary site.  The central lymph node was far deeper within the breast parenchyma.  BI-RADS-6    Assessment and plan- Patient is a 72 y.o. female with prior history of triple negative left breast cancer in 2006 now presenting with new right breast primary  stage I A c T1c c N0 c MX invasive mammary carcinoma, grade 1 ER PR positive and HER-2/neu negative. She is here to discuss bone density and genetic testing results  Genetic testing reveals no inherited mutations. She can therefore proceed for upfront lumpectomy and slnb which is scheduled for on 06/14/18. I will see her along with rad onc a week from her surgery to discuss adjuvant hormone therapy  Her baseline bone density scan is normal and she would be a good candidate for AI.    Visit Diagnosis No diagnosis found.   Dr. Randa Evens, MD, MPH [facility] at [facility] [phone] 06/03/2018 2:28 PM

## 2018-06-03 NOTE — Progress Notes (Signed)
Muscle spasms under bilateral breast / all the time

## 2018-06-06 ENCOUNTER — Other Ambulatory Visit: Payer: Self-pay | Admitting: General Surgery

## 2018-06-07 ENCOUNTER — Encounter
Admission: RE | Admit: 2018-06-07 | Discharge: 2018-06-07 | Disposition: A | Discharge status: Home / Self Care | Payer: Medicare HMO | Source: Ambulatory Visit | Attending: General Surgery | Admitting: General Surgery

## 2018-06-07 ENCOUNTER — Telehealth: Payer: Self-pay | Admitting: *Deleted

## 2018-06-07 ENCOUNTER — Other Ambulatory Visit: Payer: Self-pay

## 2018-06-07 DIAGNOSIS — Z01812 Encounter for preprocedural laboratory examination: Secondary | ICD-10-CM | POA: Insufficient documentation

## 2018-06-07 HISTORY — DX: Adverse effect of unspecified anesthetic, initial encounter: T41.45XA

## 2018-06-07 HISTORY — DX: Headache, unspecified: R51.9

## 2018-06-07 HISTORY — DX: Pneumonia, unspecified organism: J18.9

## 2018-06-07 HISTORY — DX: Cardiac murmur, unspecified: R01.1

## 2018-06-07 HISTORY — DX: Headache: R51

## 2018-06-07 LAB — BASIC METABOLIC PANEL
Anion gap: 9 (ref 5–15)
BUN: 27 mg/dL — AB (ref 8–23)
CO2: 29 mmol/L (ref 22–32)
CREATININE: 0.9 mg/dL (ref 0.44–1.00)
Calcium: 9.1 mg/dL (ref 8.9–10.3)
Chloride: 103 mmol/L (ref 98–111)
GFR calc Af Amer: >60 mL/min (ref >60)
Glucose, Bld: 105 mg/dL — ABNORMAL HIGH (ref 70–99)
Potassium: 5.1 mmol/L (ref 3.5–5.1)
SODIUM: 141 mmol/L (ref 135–145)

## 2018-06-07 LAB — CBC
HCT: 39.9 % (ref 35.0–47.0)
Hemoglobin: 13.2 g/dL (ref 12.0–16.0)
MCH: 27.9 pg (ref 26.0–34.0)
MCHC: 33 g/dL (ref 32.0–36.0)
MCV: 84.5 fL (ref 80.0–100.0)
PLATELETS: 258 10*3/uL (ref 150–440)
RBC: 4.72 MIL/uL (ref 3.80–5.20)
RDW: 15.1 % — ABNORMAL HIGH (ref 11.5–14.5)
WBC: 7.4 10*3/uL (ref 3.6–11.0)

## 2018-06-07 MED ORDER — LIDOCAINE-PRILOCAINE 2.5-2.5 % EX CREA: TOPICAL_CREAM | CUTANEOUS | 0 refills | Status: DC

## 2018-06-07 NOTE — Patient Instructions (Addendum)
  Your procedure is scheduled on: Monday June 14, 2018 @ 7:45 am. Report to [facility].  Remember: Instructions that are not followed completely may result in serious medical risk, up to and including death, or upon the discretion of your surgeon and anesthesiologist your surgery may need to be rescheduled.    _x___ 1. Do not eat food (including mints, candies, chewing gum) after midnight the night before your procedure. You may drink water up to 2 hours before you are scheduled to arrive at the hospital for your procedure.  Do not drink anything within 2 hours of your scheduled arrival to the hospital.    __x__ 2. No Alcohol for 24 hours before or after surgery.   __x__ 3. No Smoking or e-cigarettes for 24 prior to surgery.  Do not use any chewable tobacco products for at least 6 hour prior to surgery   __x__ 4. Notify your doctor if there is any change in your medical condition (cold, fever, infections).   __x__ 5. On the morning of surgery brush your teeth with toothpaste and water.  You may rinse your mouth with mouth wash if you wish.  Do not swallow any toothpaste or mouthwash.  Please read over the following fact sheets that you were given:   [facility] Preparing for Surgery and or MRSA Information    __x__ Use CHG Soap or sage wipes as directed on instruction sheet    Do not wear jewelry, make-up, hairpins, clips or nail polish.  Do not wear lotions, powders, deodorant, or perfumes.   Do not shave below the face/neck 48 hours prior to surgery.   Do not bring valuables to the hospital.    [facility] is not responsible for any belongings or valuables.               Contacts, dentures or bridgework may not be worn into surgery.  Leave your suitcase in the car. After surgery it may be brought to your room.  For patients admitted to the hospital, discharge time is determined by your treatment team.  For patients discharged on the day of surgery,  you will NOT be permitted to drive yourself home.   _x___ Take anti-hypertensive listed below, cardiac, seizure, asthma, anti-reflux and psychiatric medicines. These include:  1. Clonidine/Catapres  2. Isosorbide/Imdur  3. Omeprazole/Prilosec  4. Oxybutynin/Ditropan   Do NOT take your Losartan (Cozaar) to Torsemide (Demadex)  _x___ Bring C-Pap to the hospital.   _x___ Follow recommendations from Cardiologist, Pulmonologist or PCP regarding stopping Aspirin, Coumadin, Plavix ,Eliquis, Effient, or Pradaxa, and Pletal.  _x___ Stop Anti-inflammatories such as Advil, Aleve, Ibuprofen, Motrin, Naproxen, Naprosyn, Goodies powders or aspirin products. OK to take Tylenol and Celebrex.   _x___ NOW: Stop supplements until after surgery.  But may continue Vitamin D, Vitamin B, and multivitamin.

## 2018-06-07 NOTE — Telephone Encounter (Signed)
Message left for patient to call the office.   We need to see if patient has prescription for EMLA cream. If not, we need to instruct patient on how to use and verify which pharmacy patient wants prescription sent in to.

## 2018-06-07 NOTE — Telephone Encounter (Signed)
-----   Message from Robert Bellow, MD sent at 06/06/2018  4:40 PM EDT ----- Be sure she has RX for EMLA cream. Thanks.  ----- Message ----- From: Dominga Ferry, CMA Sent: 06/03/2018   4:02 PM EDT To: Robert Bellow, MD  Please complete orders on this patient. Caryl-Lyn has her scheduled for pre-admit on 06-07-18 at 9:45 am. Thanks.

## 2018-06-07 NOTE — Telephone Encounter (Signed)
Patient called the office back and verbalizes understanding of how to use EMLA cream day of surgery.   The patient states that a prescription has not been sent in yet but wishes for [place] to send this in to [facility] [place].   Prescription was sent today.   Patient instructed to call the office should she have further questions.

## 2018-06-09 ENCOUNTER — Telehealth: Payer: Self-pay

## 2018-06-09 NOTE — Telephone Encounter (Signed)
Claudi from [facility] office called stating Dr.Ali is requesting a peer to peer call with Dr.Byrnett regarding patients breat lesion.

## 2018-06-10 NOTE — Telephone Encounter (Signed)
Error

## 2018-06-14 ENCOUNTER — Other Ambulatory Visit: Payer: Self-pay

## 2018-06-14 ENCOUNTER — Ambulatory Visit: Payer: Medicare HMO | Admitting: Anesthesiology

## 2018-06-14 ENCOUNTER — Ambulatory Visit
Admission: RE | Admit: 2018-06-14 | Discharge: 2018-06-14 | Disposition: A | Discharge status: Home / Self Care | Payer: Medicare HMO | Source: Ambulatory Visit | Attending: General Surgery | Admitting: General Surgery

## 2018-06-14 ENCOUNTER — Encounter
Admission: RE | Disposition: A | Discharge status: Home / Self Care | Payer: Self-pay | Source: Ambulatory Visit | Attending: General Surgery

## 2018-06-14 ENCOUNTER — Encounter: Payer: Self-pay | Admitting: Certified Registered Nurse Anesthetist

## 2018-06-14 DIAGNOSIS — E669 Obesity, unspecified: Secondary | ICD-10-CM | POA: Diagnosis not present

## 2018-06-14 DIAGNOSIS — Z6839 Body mass index (BMI) 39.0-39.9, adult: Secondary | ICD-10-CM | POA: Diagnosis not present

## 2018-06-14 DIAGNOSIS — Z87891 Personal history of nicotine dependence: Secondary | ICD-10-CM | POA: Diagnosis not present

## 2018-06-14 DIAGNOSIS — Z170 Estrogen receptor positive status [ER+]: Principal | ICD-10-CM

## 2018-06-14 DIAGNOSIS — Z853 Personal history of malignant neoplasm of breast: Secondary | ICD-10-CM | POA: Insufficient documentation

## 2018-06-14 DIAGNOSIS — C50411 Malignant neoplasm of upper-outer quadrant of right female breast: Secondary | ICD-10-CM

## 2018-06-14 DIAGNOSIS — Z79899 Other long term (current) drug therapy: Secondary | ICD-10-CM | POA: Insufficient documentation

## 2018-06-14 DIAGNOSIS — I110 Hypertensive heart disease with heart failure: Secondary | ICD-10-CM | POA: Insufficient documentation

## 2018-06-14 DIAGNOSIS — Z7982 Long term (current) use of aspirin: Secondary | ICD-10-CM | POA: Diagnosis not present

## 2018-06-14 DIAGNOSIS — K219 Gastro-esophageal reflux disease without esophagitis: Secondary | ICD-10-CM | POA: Insufficient documentation

## 2018-06-14 DIAGNOSIS — C50919 Malignant neoplasm of unspecified site of unspecified female breast: Secondary | ICD-10-CM

## 2018-06-14 DIAGNOSIS — Z923 Personal history of irradiation: Secondary | ICD-10-CM | POA: Diagnosis not present

## 2018-06-14 DIAGNOSIS — I509 Heart failure, unspecified: Secondary | ICD-10-CM | POA: Diagnosis not present

## 2018-06-14 DIAGNOSIS — G4730 Sleep apnea, unspecified: Secondary | ICD-10-CM | POA: Diagnosis not present

## 2018-06-14 DIAGNOSIS — Z9221 Personal history of antineoplastic chemotherapy: Secondary | ICD-10-CM | POA: Insufficient documentation

## 2018-06-14 HISTORY — PX: PARTIAL MASTECTOMY WITH NEEDLE LOCALIZATION: SHX6008

## 2018-06-14 HISTORY — DX: Malignant neoplasm of unspecified site of unspecified female breast: C50.919

## 2018-06-14 HISTORY — PX: SENTINEL NODE BIOPSY: SHX6608

## 2018-06-14 LAB — URINE DRUG SCREEN, QUALITATIVE (ARMC ONLY)
Amphetamines, Ur Screen: NOT DETECTED
BARBITURATES, UR SCREEN: NOT DETECTED
BENZODIAZEPINE, UR SCRN: NOT DETECTED
COCAINE METABOLITE, UR ~~LOC~~: NOT DETECTED
Cannabinoid 50 Ng, Ur ~~LOC~~: NOT DETECTED
MDMA (Ecstasy)Ur Screen: NOT DETECTED
METHADONE SCREEN, URINE: NOT DETECTED
OPIATE, UR SCREEN: NOT DETECTED
Phencyclidine (PCP) Ur S: NOT DETECTED
Tricyclic, Ur Screen: NOT DETECTED

## 2018-06-14 SURGERY — PARTIAL MASTECTOMY WITH NEEDLE LOCALIZATION
Anesthesia: General | Site: Breast | Laterality: Right

## 2018-06-14 MED ORDER — METHYLENE BLUE 0.5 % INJ SOLN
INTRAVENOUS | Status: DC | PRN
  Administered 2018-06-14: 5 mL via SUBMUCOSAL

## 2018-06-14 MED ORDER — HYDROCODONE-ACETAMINOPHEN 5-325 MG PO TABS: 1.0000 | ORAL_TABLET | ORAL | 0 refills | Status: DC | PRN

## 2018-06-14 MED ORDER — ACETAMINOPHEN NICU IV SYRINGE 10 MG/ML
INTRAVENOUS | Status: AC
  Filled 2018-06-14: qty 1

## 2018-06-14 MED ORDER — ACETAMINOPHEN 10 MG/ML IV SOLN
INTRAVENOUS | Status: DC | PRN
  Administered 2018-06-14: 1000 mg via INTRAVENOUS

## 2018-06-14 MED ORDER — DEXAMETHASONE SODIUM PHOSPHATE 10 MG/ML IJ SOLN
INTRAMUSCULAR | Status: AC
  Filled 2018-06-14: qty 1

## 2018-06-14 MED ORDER — MIDAZOLAM HCL 2 MG/2ML IJ SOLN
INTRAMUSCULAR | Status: AC
  Filled 2018-06-14: qty 2

## 2018-06-14 MED ORDER — FENTANYL CITRATE (PF) 100 MCG/2ML IJ SOLN
INTRAMUSCULAR | Status: AC
  Administered 2018-06-14: 50 ug via INTRAVENOUS
  Filled 2018-06-14: qty 2

## 2018-06-14 MED ORDER — ONDANSETRON HCL 4 MG/2ML IJ SOLN
INTRAMUSCULAR | Status: AC
  Filled 2018-06-14: qty 2

## 2018-06-14 MED ORDER — PROPOFOL 10 MG/ML IV BOLUS
INTRAVENOUS | Status: AC
  Filled 2018-06-14: qty 20

## 2018-06-14 MED ORDER — OXYCODONE HCL 5 MG/5ML PO SOLN: 5.0000 mg | Freq: Once | ORAL | Status: DC | PRN

## 2018-06-14 MED ORDER — LIDOCAINE HCL (CARDIAC) PF 100 MG/5ML IV SOSY
PREFILLED_SYRINGE | INTRAVENOUS | Status: DC | PRN
  Administered 2018-06-14: 100 mg via INTRAVENOUS

## 2018-06-14 MED ORDER — ONDANSETRON HCL 4 MG/2ML IJ SOLN
INTRAMUSCULAR | Status: DC | PRN
  Administered 2018-06-14: 4 mg via INTRAVENOUS

## 2018-06-14 MED ORDER — LACTATED RINGERS IV SOLN
INTRAVENOUS | Status: DC
  Administered 2018-06-14: 09:00:00 via INTRAVENOUS

## 2018-06-14 MED ORDER — METHYLENE BLUE 0.5 % INJ SOLN
INTRAVENOUS | Status: AC
  Filled 2018-06-14: qty 10

## 2018-06-14 MED ORDER — FENTANYL CITRATE (PF) 100 MCG/2ML IJ SOLN
25.0000 ug | INTRAMUSCULAR | Status: DC | PRN
  Administered 2018-06-14: 50 ug via INTRAVENOUS
  Administered 2018-06-14 (×2): 25 ug via INTRAVENOUS

## 2018-06-14 MED ORDER — PROPOFOL 10 MG/ML IV BOLUS
INTRAVENOUS | Status: DC | PRN
  Administered 2018-06-14: 140 mg via INTRAVENOUS

## 2018-06-14 MED ORDER — MEPERIDINE HCL 50 MG/ML IJ SOLN: 6.2500 mg | INTRAMUSCULAR | Status: DC | PRN

## 2018-06-14 MED ORDER — FENTANYL CITRATE (PF) 100 MCG/2ML IJ SOLN
INTRAMUSCULAR | Status: DC | PRN
  Administered 2018-06-14 (×3): 25 ug via INTRAVENOUS
  Administered 2018-06-14 (×2): 50 ug via INTRAVENOUS
  Administered 2018-06-14 (×3): 25 ug via INTRAVENOUS

## 2018-06-14 MED ORDER — FENTANYL CITRATE (PF) 250 MCG/5ML IJ SOLN
INTRAMUSCULAR | Status: AC
  Filled 2018-06-14: qty 5

## 2018-06-14 MED ORDER — OXYCODONE HCL 5 MG PO TABS: 5.0000 mg | ORAL_TABLET | Freq: Once | ORAL | Status: DC | PRN

## 2018-06-14 MED ORDER — TECHNETIUM TC 99M SULFUR COLLOID FILTERED
1.0000 | Freq: Once | INTRAVENOUS | Status: AC | PRN
  Administered 2018-06-14: 0.885 via INTRADERMAL

## 2018-06-14 MED ORDER — LIDOCAINE HCL (PF) 2 % IJ SOLN
INTRAMUSCULAR | Status: AC
  Filled 2018-06-14: qty 10

## 2018-06-14 MED ORDER — PROMETHAZINE HCL 25 MG/ML IJ SOLN: 6.2500 mg | INTRAMUSCULAR | Status: DC | PRN

## 2018-06-14 MED ORDER — MIDAZOLAM HCL 2 MG/2ML IJ SOLN
INTRAMUSCULAR | Status: DC | PRN
  Administered 2018-06-14: 2 mg via INTRAVENOUS

## 2018-06-14 MED ORDER — DEXAMETHASONE SODIUM PHOSPHATE 10 MG/ML IJ SOLN
INTRAMUSCULAR | Status: DC | PRN
  Administered 2018-06-14: 10 mg via INTRAVENOUS

## 2018-06-14 MED ORDER — BUPIVACAINE-EPINEPHRINE (PF) 0.5% -1:200000 IJ SOLN
INTRAMUSCULAR | Status: DC | PRN
  Administered 2018-06-14: 20 mL

## 2018-06-14 MED ORDER — KETAMINE HCL 50 MG/ML IJ SOLN
INTRAMUSCULAR | Status: DC | PRN
  Administered 2018-06-14: 50 mg via INTRAMUSCULAR

## 2018-06-14 MED ORDER — BUPIVACAINE-EPINEPHRINE (PF) 0.5% -1:200000 IJ SOLN
INTRAMUSCULAR | Status: AC
  Filled 2018-06-14: qty 30

## 2018-06-14 SURGICAL SUPPLY — 63 items
APPLIER CLIP 11 MED OPEN (CLIP)
APPLIER CLIP 13 LRG OPEN (CLIP)
BINDER BREAST LRG (GAUZE/BANDAGES/DRESSINGS) IMPLANT
BINDER BREAST MEDIUM (GAUZE/BANDAGES/DRESSINGS) IMPLANT
BINDER BREAST XLRG (GAUZE/BANDAGES/DRESSINGS) IMPLANT
BINDER BREAST XXLRG (GAUZE/BANDAGES/DRESSINGS) ×3 IMPLANT
BLADE PHOTON ILLUMINATED (MISCELLANEOUS) IMPLANT
BLADE SURG 15 STRL SS SAFETY (BLADE) ×3 IMPLANT
BULB RESERV EVAC DRAIN JP 100C (MISCELLANEOUS) ×3 IMPLANT
CANISTER SUCT 1200ML W/VALVE (MISCELLANEOUS) ×3 IMPLANT
CHLORAPREP W/TINT 26ML (MISCELLANEOUS) ×3 IMPLANT
CLIP APPLIE 11 MED OPEN (CLIP) IMPLANT
CLIP APPLIE 13 LRG OPEN (CLIP) IMPLANT
CLOSURE WOUND 1/2 X4 (GAUZE/BANDAGES/DRESSINGS) ×2
CNTNR SPEC 2.5X3XGRAD LEK (MISCELLANEOUS) ×3
CONT SPEC 4OZ STER OR WHT (MISCELLANEOUS) ×6
CONTAINER SPEC 2.5X3XGRAD LEK (MISCELLANEOUS) ×3 IMPLANT
COVER PROBE FLX POLY STRL (MISCELLANEOUS) ×3 IMPLANT
DEVICE DUBIN SPECIMEN MAMMOGRA (MISCELLANEOUS) IMPLANT
DRAIN CHANNEL JP 15F RND 16 (MISCELLANEOUS) ×3 IMPLANT
DRAPE LAPAROTOMY TRNSV 106X77 (MISCELLANEOUS) ×3 IMPLANT
DRSG GAUZE FLUFF 36X18 (GAUZE/BANDAGES/DRESSINGS) ×3 IMPLANT
DRSG TELFA 3X8 NADH (GAUZE/BANDAGES/DRESSINGS) ×3 IMPLANT
ELECT CAUTERY BLADE TIP 2.5 (TIP) ×3
ELECT REM PT RETURN 9FT ADLT (ELECTROSURGICAL) ×3
ELECTRODE CAUTERY BLDE TIP 2.5 (TIP) ×1 IMPLANT
ELECTRODE REM PT RTRN 9FT ADLT (ELECTROSURGICAL) ×1 IMPLANT
GAUZE 4X4 16PLY RFD (DISPOSABLE) ×3 IMPLANT
GLOVE BIO SURGEON STRL SZ 6.5 (GLOVE) ×2 IMPLANT
GLOVE BIO SURGEON STRL SZ7.5 (GLOVE) ×3 IMPLANT
GLOVE BIO SURGEONS STRL SZ 6.5 (GLOVE) ×1
GLOVE INDICATOR 8.0 STRL GRN (GLOVE) ×3 IMPLANT
GOWN STRL REUS W/ TWL LRG LVL3 (GOWN DISPOSABLE) ×2 IMPLANT
GOWN STRL REUS W/TWL LRG LVL3 (GOWN DISPOSABLE) ×4
HANDLE YANKAUER SUCT BULB TIP (MISCELLANEOUS) ×3 IMPLANT
KIT TURNOVER KIT A (KITS) ×3 IMPLANT
LABEL OR SOLS (LABEL) ×3 IMPLANT
MARGIN MAP 10MM (MISCELLANEOUS) ×3 IMPLANT
NDL SAFETY ECLIPSE 18X1.5 (NEEDLE) ×1 IMPLANT
NEEDLE HYPO 18GX1.5 SHARP (NEEDLE) ×2
NEEDLE HYPO 22GX1.5 SAFETY (NEEDLE) ×3 IMPLANT
NEEDLE HYPO 25X1 1.5 SAFETY (NEEDLE) ×3 IMPLANT
PACK BASIN MINOR ARMC (MISCELLANEOUS) ×3 IMPLANT
PENCIL ELECTRO HAND CTR (MISCELLANEOUS) ×3 IMPLANT
PIN SAFETY STRL (MISCELLANEOUS) ×3 IMPLANT
RETRACTOR RING XSMALL (MISCELLANEOUS) ×1 IMPLANT
RTRCTR WOUND ALEXIS 13CM XS SH (MISCELLANEOUS) ×3
SHEARS FOC LG CVD HARMONIC 17C (MISCELLANEOUS) IMPLANT
SLEVE PROBE SENORX GAMMA FIND (MISCELLANEOUS) ×3 IMPLANT
SPONGE LAP 18X18 RF (DISPOSABLE) ×3 IMPLANT
STRIP CLOSURE SKIN 1/2X4 (GAUZE/BANDAGES/DRESSINGS) ×4 IMPLANT
SUT ETHILON 3-0 FS-10 30 BLK (SUTURE) ×3
SUT SILK 2 0 (SUTURE) ×2
SUT SILK 2-0 30XBRD TIE 12 (SUTURE) ×1 IMPLANT
SUT VIC AB 2-0 CT1 27 (SUTURE) ×6
SUT VIC AB 2-0 CT1 TAPERPNT 27 (SUTURE) ×3 IMPLANT
SUT VIC AB 3-0 SH 27 (SUTURE) ×2
SUT VIC AB 3-0 SH 27X BRD (SUTURE) ×1 IMPLANT
SUT VICRYL+ 3-0 144IN (SUTURE) ×3 IMPLANT
SUTURE EHLN 3-0 FS-10 30 BLK (SUTURE) ×1 IMPLANT
SWABSTK COMLB BENZOIN TINCTURE (MISCELLANEOUS) ×3 IMPLANT
SYR 10ML LL (SYRINGE) ×3 IMPLANT
TAPE TRANSPORE STRL 2 31045 (GAUZE/BANDAGES/DRESSINGS) ×6 IMPLANT

## 2018-06-14 NOTE — Anesthesia Postprocedure Evaluation (Signed)
Anesthesia Post Note  Patient: [name]  Procedure(s) Performed: PARTIAL MASTECTOMY WITH NEEDLE LOCALIZATION (Right Breast) SENTINEL NODE BIOPSY (Right Breast)  Patient location during evaluation: PACU Anesthesia Type: General Level of consciousness: awake and alert and oriented Pain management: pain level controlled Vital Signs Assessment: post-procedure vital signs reviewed and stable Respiratory status: spontaneous breathing, nonlabored ventilation and respiratory function stable Cardiovascular status: blood pressure returned to baseline and stable Postop Assessment: no signs of nausea or vomiting Anesthetic complications: no     Last Vitals:  Vitals:   06/14/18 1430 06/14/18 1445  BP: (!) 150/79 (!) 151/85  Pulse: 65 69  Resp: (!) 5 10  Temp:    SpO2: 94% 92%    Last Pain:  Vitals:   06/14/18 1445  TempSrc:   PainSc: 0-No pain                 Rayman Petrosian

## 2018-06-14 NOTE — Discharge Instructions (Signed)

## 2018-06-14 NOTE — Anesthesia Preprocedure Evaluation (Signed)
Anesthesia Evaluation  Patient identified by MRN, date of birth, ID band Patient awake    Reviewed: Allergy & Precautions, NPO status , Patient's Chart, lab work & pertinent test results  History of Anesthesia Complications (+) PROLONGED EMERGENCE and history of anesthetic complications  Airway Mallampati: III  TM Distance: >3 FB Neck ROM: Full    Dental  (+) Edentulous Upper, Edentulous Lower   Pulmonary sleep apnea and Continuous Positive Airway Pressure Ventilation , neg COPD, former smoker,    breath sounds clear to auscultation- rhonchi (-) wheezing      Cardiovascular hypertension, Pt. on medications +CHF (diastolic)  (-) CAD, (-) Past MI, (-) Cardiac Stents and (-) CABG  Rhythm:Regular Rate:Normal - Systolic murmurs and - Diastolic murmurs    Neuro/Psych  Headaches, TIAnegative psych ROS   GI/Hepatic Neg liver ROS, GERD  ,  Endo/Other  negative endocrine ROSneg diabetes  Renal/GU negative Renal ROS     Musculoskeletal negative musculoskeletal ROS (+)   Abdominal (+) + obese,   Peds  Hematology negative hematology ROS (+)   Anesthesia Other Findings Past Medical History: No date: Abnormality of gait 02/05/2005: Breast cancer ([facility])     Comment:  3.5 cm invasive mammary carcinoma, T2, N0,; triple               negative, whole breast radiation, cytoxan, taxotere               chemotherapy.  No date: CHF (congestive heart failure) ([place])     Comment:  March 28, 2017 echo: EF 50-55%; 1) echo 07/2011: EF               20-25%, mild concentric hypertrophy, diffuse HK, regional              WMA cannot be excluded, grade 1 DD, mitral valve with               mild regurg, LA mildly dilated). 2) EF 40-45%, mild AS,   7425: Complication of anesthesia     Comment:  pt states "hard to wake up" after L breast surgery  No date: Edema No date: GERD (gastroesophageal reflux disease) No date: Headache No date: Heart  murmur No date: Hypertension No date: Hypokinesis     Comment:  global, EF 35-45 % from Echo. No date: Malignant neoplasm of breast (female), unspecified site     Comment:  left No date: Neuropathy No date: Obesity No date: Pain in joint, site unspecified No date: Pneumonia No date: Sleep apnea No date: Tinea pedis No date: Unspecified hearing loss     Comment:  bilateral   Reproductive/Obstetrics                             Anesthesia Physical Anesthesia Plan  ASA: III  Anesthesia Plan: General   Post-op Pain Management:    Induction: Intravenous  PONV Risk Score and Plan: 2 and Ondansetron, Dexamethasone and Midazolam  Airway Management Planned: LMA  Additional Equipment:   Intra-op Plan:   Post-operative Plan:   Informed Consent: I have reviewed the patients History and Physical, chart, labs and discussed the procedure including the risks, benefits and alternatives for the proposed anesthesia with the patient or authorized representative who has indicated his/her understanding and acceptance.   Dental advisory given  Plan Discussed with: CRNA and Anesthesiologist  Anesthesia Plan Comments:         Anesthesia Quick Evaluation

## 2018-06-14 NOTE — Anesthesia Post-op Follow-up Note (Signed)
Anesthesia QCDR form completed.        

## 2018-06-14 NOTE — H&P (Signed)
No change in clinical history or exam Pain w/ SLN injection. She did not use EMLA cream as prescribed. For right breast wide excision and SLN biopsy.

## 2018-06-14 NOTE — Transfer of Care (Signed)
Immediate Anesthesia Transfer of Care Note  Patient: [name]  Procedure(s) Performed: PARTIAL MASTECTOMY WITH NEEDLE LOCALIZATION (Right Breast) SENTINEL NODE BIOPSY (Right Breast)  Patient Location: PACU  Anesthesia Type:General  Level of Consciousness: awake, alert , oriented and patient cooperative  Airway & Oxygen Therapy: Patient Spontanous Breathing and Patient connected to face mask oxygen  Post-op Assessment: Report given to RN and Post -op Vital signs reviewed and stable  Post vital signs: Reviewed and stable  Last Vitals:  Vitals Value Taken Time  BP 180/80 06/14/2018  1:29 PM  Temp    Pulse 77 06/14/2018  1:31 PM  Resp 18 06/14/2018  1:31 PM  SpO2 98 % 06/14/2018  1:31 PM  Vitals shown include unvalidated device data.  Last Pain:  Vitals:   06/14/18 0722  TempSrc: Temporal  PainSc: 0-No pain         Complications: No apparent anesthesia complications

## 2018-06-14 NOTE — OR Nursing (Signed)
Spoke with Mindy in [place], advises ok to discharge pt to home without MD visit to postop. [place]

## 2018-06-14 NOTE — Op Note (Signed)
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  Postoperative diagnosis: Same.  Operative procedure: Right breast wide excision with wire localization and ultrasound guidance, sentinel node biopsy.  Operating Surgeon: Hervey Ard, MD.  Anesthesia: General by LMA, Marcaine 0.5% with 1 to 200,000 units of epinephrine, 30 cc.  Clinical note: This 72 year old woman was recent identified with a new mammographic abnormality and biopsy showed evidence of invasive mammary carcinoma.  She desired to proceed to breast conservation as she had done in the contralateral side in 2006.  The patient was injected with technetium sulfur colloid prior to presentation of the operating theater.  As she had 2 biopsies near adjacent to each other and only one was malignant (the second to benign intramammary lymph node) she underwent wire localization.  Operative note: With the patient under adequate general anesthesia the area of the breast chest and axilla was cleansed with ChloraPrep and draped.  Ultrasound was used to identify the path of the localizing wire in the upper outer quadrant of the right breast.  Local anesthesia was infiltrated about 3 cm away from the planned incision.  A curvilinear incision from the 10 to 12 o'clock position was made carried out the skin and subcutaneous tissue with hemostasis achieved by electrocautery.  An [place] wound protector was placed to provide exposure.  The localizing wire was brought in of the wound.  A 3 x 3 x 4 cm block of tissue was excised and orientated.  Specimen radiograph showed the biopsy site within the center of the specimen.  Phone follow-up with pathology to include the information of the localizing wire was coming and superiorly was provided.  While the breast specimen was being processed attention was turned to the axilla.  The node seeker device showed the area of increased uptake at the very base of the  hairline.  Local anesthesia was infiltrated.  A transverse incision was made and carried down through approximately 4 cm of adipose tissue.  The axillary envelope was opened.  3 hot, non-blue nodes were identified the largest about 1.4 cm in diameter.  Counts ranged between 312 100.  After removal of these nodes counts were 0.  A small amount of axillary fat attached to the nodes that was dissected free was sent for routine histology.  Bleeding in the axilla was the most troublesome spot with 2 small arterial bleeders on the superior edge.  These were controlled with 3-0 Vicryl suture ligatures and 3-0 Vicryl ties.  After getting good hemostasis the axillary envelope was closed with interrupted 2-0 Vicryl figure-of-eight sutures.  The adipose layer was approximated in similar fashion.  The skin was then closed with a running 4-0 Vicryl subcuticular suture.  Attention was turned back to the wide excision site.  Pathology report showed the closest margin to be deep at about 4 mm.  This was felt adequate.  The deep tissue was approximated with interrupted 2-0 Vicryl sutures.  These adipose layer was then approximated in a similar fashion.  The skin was closed with a running 4-0 Vicryl subcuticular suture.  Benzoin Steri-Strips applied to both wounds.  Telfa, fluff gauze and a compressive wrap were applied.  The patient tolerated the procedure well and was taken to recovery room in stable condition.

## 2018-06-14 NOTE — Anesthesia Procedure Notes (Signed)
Procedure Name: LMA Insertion Date/Time: 06/14/2018 11:48 AM Performed by: Eben Burow, CRNA Pre-anesthesia Checklist: Patient identified, Emergency Drugs available, Suction available, Patient being monitored and Timeout performed Patient Re-evaluated:Patient Re-evaluated prior to induction Oxygen Delivery Method: Circle system utilized Preoxygenation: Pre-oxygenation with 100% oxygen Induction Type: IV induction LMA: LMA inserted LMA Size: 4.0 Number of attempts: 1 Placement Confirmation: positive ETCO2 and breath sounds checked- equal and bilateral Tube secured with: Tape Dental Injury: Teeth and Oropharynx as per pre-operative assessment

## 2018-06-16 LAB — SURGICAL PATHOLOGY

## 2018-06-17 ENCOUNTER — Other Ambulatory Visit: Payer: Self-pay

## 2018-06-18 ENCOUNTER — Telehealth: Payer: Self-pay

## 2018-06-18 NOTE — Telephone Encounter (Signed)
-----   Message from Robert Bellow, MD sent at 06/18/2018  9:53 AM EDT ----- Please notify the patient all the margins were clear.  Lymph glands were free of disease.  Follow-up as scheduled. ----- Message ----- From: Interface, Lab In Three Zero One Sent: 06/16/2018   3:01 PM EDT To: Robert Bellow, MD

## 2018-06-18 NOTE — Telephone Encounter (Signed)
Notified patient as instructed, patient pleased. Discussed follow-up appointments, patient agrees  

## 2018-06-22 ENCOUNTER — Ambulatory Visit (INDEPENDENT_AMBULATORY_CARE_PROVIDER_SITE_OTHER): Payer: Medicare HMO | Admitting: General Surgery

## 2018-06-22 ENCOUNTER — Other Ambulatory Visit: Payer: Self-pay

## 2018-06-22 ENCOUNTER — Encounter: Payer: Self-pay | Admitting: General Surgery

## 2018-06-22 ENCOUNTER — Ambulatory Visit: Payer: Self-pay

## 2018-06-22 VITALS — BP 161/79 | HR 70 | Temp 97.7°F | Resp 22 | Ht 69.0 in | Wt 282.2 lb

## 2018-06-22 DIAGNOSIS — Z170 Estrogen receptor positive status [ER+]: Principal | ICD-10-CM

## 2018-06-22 DIAGNOSIS — C50411 Malignant neoplasm of upper-outer quadrant of right female breast: Secondary | ICD-10-CM

## 2018-06-22 NOTE — Progress Notes (Signed)
Patient ID: [name], female   DOB: Jul 11, 1946, 72 y.o.   MRN: [id number]  Chief Complaint  Patient presents with  . Routine Post Op     post op-partial right mastectomy-06/14/18    HPI [name] is a 72 y.o. female is here today for post op-partial right mastectomy-06/14/18-[name]-1 wk po. Patient is here alone today states that she is having some discomfort.  HPI  Past Medical History:  Diagnosis Date  . Abnormality of gait   . Breast cancer ([place]) 02/05/2005   3.5 cm invasive mammary carcinoma, T2, N0,; triple negative, whole breast radiation, cytoxan, taxotere chemotherapy.   . CHF (congestive heart failure) ([place])    March 28, 2017 echo: EF 50-55%; 1) echo 07/2011: EF 20-25%, mild concentric hypertrophy, diffuse HK, regional WMA cannot be excluded, grade 1 DD, mitral valve with mild regurg, LA mildly dilated). 2) EF 40-45%, mild AS,                    . Complication of anesthesia 2006   pt states "hard to wake up" after L breast surgery   . Edema   . GERD (gastroesophageal reflux disease)   . Headache   . Heart murmur   . Hypertension   . Hypokinesis    global, EF 35-45 % from Echo.  . Malignant neoplasm of breast (female), unspecified site 06/14/2018   T1b, N0  . Neuropathy   . Obesity   . Pain in joint, site unspecified   . Pneumonia   . Sleep apnea   . Tinea pedis   . Unspecified hearing loss    bilateral    Past Surgical History:  Procedure Laterality Date  . BREAST LUMPECTOMY  2006   left breast   . CATARACT EXTRACTION     left eye  . HERNIA REPAIR    . PARTIAL MASTECTOMY WITH NEEDLE LOCALIZATION Right 06/14/2018   Procedure: PARTIAL MASTECTOMY WITH NEEDLE LOCALIZATION;  Surgeon: [name] Bellow, MD;  Location: [facility] ORS;  Service: General;  Laterality: Right;  . SENTINEL NODE BIOPSY Right 06/14/2018   Procedure: SENTINEL NODE BIOPSY;  Surgeon: [name] Bellow, MD;  Location: [facility] ORS;  Service: General;  Laterality: Right;  . TUBAL LIGATION       Family History  Problem Relation Age of Onset  . Hypertension Mother        deceased 26  . Cataracts Mother   . Breast cancer Other 52       maternal half-sister; deceased 110  . Coronary artery disease Other     Social History Social History   Tobacco Use  . Smoking status: Former Smoker    Packs/day: 0.50    Years: 30.00    Pack years: 15.00    Types: Cigarettes    Last attempt to quit: 07/14/2000    Years since quitting: 17.9  . Smokeless tobacco: Never Used  Substance Use Topics  . Alcohol use: No  . Drug use: Not Currently    Types: Marijuana    Comment: endorses prior marijuana use 1999    No Known Allergies  Current Outpatient Medications  Medication Sig Dispense Refill  . aspirin EC 81 MG tablet Take 81 mg by mouth daily.      . cloNIDine (CATAPRES) 0.2 MG tablet Take 1 tablet (0.2 mg total) by mouth 2 (two) times daily. 60 tablet 11  . clotrimazole (LOTRIMIN) 1 % cream Apply 1 application topically 2 (two) times daily.   1  .  HYDROcodone-acetaminophen (NORCO/VICODIN) 5-325 MG tablet Take 1 tablet by mouth every 4 (four) hours as needed for moderate pain. 20 tablet 0  . Incontinence Supply Disposable (BLADDER CONTROL PADS EX ABSORB) MISC     . Incontinence Supply Disposable (BLADDER CONTROL PADS EX ABSORB) MISC     . lidocaine-prilocaine (EMLA) cream Apply cream to areola one hour prior to procedure and cover with plastic wrap. 30 g 0  . losartan (COZAAR) 25 MG tablet Take 25 mg by mouth daily.     Marland Kitchen omeprazole (PRILOSEC) 20 MG capsule Take 20 mg by mouth daily.    Marland Kitchen oxybutynin (DITROPAN) 5 MG tablet Take 5 mg by mouth 2 (two) times daily.    . potassium chloride SA (K-DUR,KLOR-CON) 20 MEQ tablet Take 1 tablet (20 mEq total) by mouth 2 (two) times daily. 60 tablet 6  . isosorbide mononitrate (IMDUR) 60 MG 24 hr tablet Take 1 tablet (60 mg total) by mouth 2 (two) times daily. 180 tablet 3  . torsemide (DEMADEX) 20 MG tablet Take 1 tablet (20 mg total) by mouth  2 (two) times daily. 60 tablet 6   No current facility-administered medications for this visit.     Review of Systems Review of Systems  Constitutional: Negative.   Respiratory: Negative.   Cardiovascular: Negative.     Blood pressure (!) 161/79, pulse 70, temperature 97.7 F (36.5 C), temperature source Temporal, resp. rate (!) 22, height 5\' 9"  (1.753 m), weight 282 lb 3.2 oz (128 kg), SpO2 94 %.  Physical Exam Physical Exam  Constitutional: She is oriented to person, place, and time. She appears well-developed and well-nourished.  Pulmonary/Chest: Effort normal and breath sounds normal. Right breast exhibits tenderness. Right breast exhibits no inverted nipple, no mass, no nipple discharge and no skin change. Left breast exhibits no inverted nipple, no mass, no nipple discharge, no skin change and no tenderness. Breasts are symmetrical.    Lymphadenopathy:    She has no cervical adenopathy.  Neurological: She is alert and oriented to person, place, and time.  Skin: Skin is warm and dry.    Data Reviewed Ultrasound examination of the wide excision site was undertaken to determine if the patient would be a candidate for accelerated partial breast radiation.  A non-clinically palpable seroma cavity measuring 1.26 x 2.16 x 3.2 cm is noted.  Minimal distance the overlying skin is 1.13 cm.  Potential candidate for accelerated partial breast radiation.  Ultrasound examination of the axilla showed a seroma cavity measuring 2.1 x 2.6 x 2.7 cm.  The patient was amenable to aspiration.  Skin cleansing with alcohol followed by 1 cc of 1% plain Xylocaine.  A 20-gauge needle was used to decompress the area with approximately 50-70% reduction after 10 cc of clear serous fluid was aspirated.  The procedure was well-tolerated.  Assessment    Symptomatic axillary seroma, partial resolution with aspiration.    Plan Patient is to follow up with Radiation Oncology.  Potential candidate for  accelerated partial breast radiation.  Oncotype DX testing is being requested by medical oncology.  HPI, Physical Exam, Assessment and Plan have been scribed under the direction and in the presence of Hervey Ard, Md.  Eudelia Bunch R. Bobette Mo, CMA  I have completed the exam and reviewed the above documentation for accuracy and completeness.  I agree with the above.  Haematologist has been used and any errors in dictation or transcription are unintentional.  Hervey Ard, M.D., F.A.C.S.   [name] 06/22/2018, 7:48  PM   

## 2018-06-22 NOTE — Patient Instructions (Addendum)
Patient is to follow up with Radiation Oncology on 06/24/18.

## 2018-06-24 ENCOUNTER — Encounter: Payer: Self-pay | Admitting: Oncology

## 2018-06-24 ENCOUNTER — Telehealth: Payer: Self-pay | Admitting: *Deleted

## 2018-06-24 ENCOUNTER — Ambulatory Visit
Admission: RE | Admit: 2018-06-24 | Discharge: 2018-06-24 | Disposition: A | Discharge status: Home / Self Care | Payer: Medicare HMO | Source: Ambulatory Visit | Attending: Radiation Oncology | Admitting: Radiation Oncology

## 2018-06-24 ENCOUNTER — Inpatient Hospital Stay: Payer: Medicare HMO | Attending: Oncology | Admitting: Oncology

## 2018-06-24 VITALS — BP 126/83 | HR 70 | Temp 97.3°F | Resp 18 | Ht 69.0 in | Wt 284.0 lb

## 2018-06-24 DIAGNOSIS — Z7189 Other specified counseling: Secondary | ICD-10-CM

## 2018-06-24 DIAGNOSIS — C50411 Malignant neoplasm of upper-outer quadrant of right female breast: Secondary | ICD-10-CM

## 2018-06-24 DIAGNOSIS — Z170 Estrogen receptor positive status [ER+]: Principal | ICD-10-CM

## 2018-06-24 DIAGNOSIS — C50911 Malignant neoplasm of unspecified site of right female breast: Secondary | ICD-10-CM

## 2018-06-24 MED ORDER — CEFADROXIL 500 MG PO CAPS: 500.0000 mg | ORAL_CAPSULE | Freq: Two times a day (BID) | ORAL | 0 refills | Status: DC

## 2018-06-24 NOTE — Telephone Encounter (Signed)
Pt is mammosite candidate per Dr Baruch Gouty. Mammosite schedule. Placement  06-30-18  at [facility] at 3:00 Scan 07-02-18 Treat  28-31 and Nov 1 Aware the [facility] will be calling her for more details Aware of ATB and directions reviewed. Aware no showers and to wear her bra while mammosite in place. Call [facility] details [phone]

## 2018-06-24 NOTE — Telephone Encounter (Signed)
Notified Missy pt daughter, of dates, instructions reviewed aware of RX, agrees.

## 2018-06-24 NOTE — Progress Notes (Signed)
Fluid drainage from right breast 2 days ago but the fluid keep coming back pain level to right breast 8. The patient has also been coughing x

## 2018-06-24 NOTE — Consult Note (Signed)
NEW PATIENT EVALUATION  Name: [name]  MRN: [id number]  Date:   06/24/2018     DOB: 08-11-1946   This 72 y.o. female patient presents to the clinic for initial evaluation of stage I (T1 BN 0 M0) invasive mammary carcinoma the right breast.ER/PR positive HER-2/neu not overexpressed status post wide local excision and sentinel node biopsy  REFERRING PHYSICIAN: Aycock, Ngwe A, MD  CHIEF COMPLAINT: No chief complaint on file.   DIAGNOSIS: The encounter diagnosis was Malignant neoplasm of upper-outer quadrant of right breast in female, estrogen receptor positive ([place]).   PREVIOUS INVESTIGATIONS:  Mammogram and ultrasound reviewed Pathology reports reviewed Clinical notes [id number]  HPI: patient is a 72 year old female well known to our department having been treated to her left breast back in 2008.she was found to have an abnormal mammogram of the right breast showinga mass in the upper outer quadrant of the right breastwith spiculated margins. This was confirmed on ultrasound and she underwent targeted ultrasound of the spiculated mass 11:00 position 7 cm from the nipple which was positive for invasive mammary carcinoma.tumor was strongly ER/PR positive HER-2/neu not overexpressed. Inframammary lymph node was also biopsy was negative for malignancy.she went on to have a wide local excision and sentinel node biopsy. Tumor measured 7 mm with margins clear at 3 mm. Tumor was overall grade 2.7 lymph nodes including sentinel nodes were all negative for metastatic disease. She is seen today and is doing well. She's quite fatigued. She had an ultrasound performed by Dr. Tollie Pizza showing a seroma cavity suitable for MammoSite balloon placement. She specifically denies breast tenderness cough or bone pain.  PLANNED TREATMENT REGIMEN: accelerated partial breast radiation to right breast.  PAST MEDICAL HISTORY:  has a past medical history of Abnormality of gait, Breast cancer ([place])  (02/05/2005), CHF (congestive heart failure) ([place]), Complication of anesthesia (2006), Edema, GERD (gastroesophageal reflux disease), Headache, Heart murmur, Hypertension, Hypokinesis, Malignant neoplasm of breast (female), unspecified site (06/14/2018), Neuropathy, Obesity, Pain in joint, site unspecified, Pneumonia, Sleep apnea, Tinea pedis, and Unspecified hearing loss.    PAST SURGICAL HISTORY:  Past Surgical History:  Procedure Laterality Date  . BREAST LUMPECTOMY  2006   left breast   . CATARACT EXTRACTION     left eye  . HERNIA REPAIR    . PARTIAL MASTECTOMY WITH NEEDLE LOCALIZATION Right 06/14/2018   Procedure: PARTIAL MASTECTOMY WITH NEEDLE LOCALIZATION;  Surgeon: Robert Bellow, MD;  Location: [facility] ORS;  Service: General;  Laterality: Right;  . SENTINEL NODE BIOPSY Right 06/14/2018   Procedure: SENTINEL NODE BIOPSY;  Surgeon: Robert Bellow, MD;  Location: [facility] ORS;  Service: General;  Laterality: Right;  . TUBAL LIGATION      FAMILY HISTORY: family history includes Breast cancer (age of onset: 49) in her other; Cataracts in her mother; Coronary artery disease in her other; Hypertension in her mother.  SOCIAL HISTORY:  reports that she quit smoking about 17 years ago. Her smoking use included cigarettes. She has a 15.00 pack-year smoking history. She has never used smokeless tobacco. She reports that she has current or past drug history. Drug: Marijuana. She reports that she does not drink alcohol.  ALLERGIES: Patient has no known allergies.  MEDICATIONS:  Current Outpatient Medications  Medication Sig Dispense Refill  . aspirin EC 81 MG tablet Take 81 mg by mouth daily.      . cloNIDine (CATAPRES) 0.2 MG tablet Take 1 tablet (0.2 mg total) by mouth 2 (two) times daily. 60 tablet 11  .  clotrimazole (LOTRIMIN) 1 % cream Apply 1 application topically 2 (two) times daily.   1  . HYDROcodone-acetaminophen (NORCO/VICODIN) 5-325 MG tablet Take 1 tablet by mouth every 4 (four)  hours as needed for moderate pain. 20 tablet 0  . Incontinence Supply Disposable (BLADDER CONTROL PADS EX ABSORB) MISC     . Incontinence Supply Disposable (BLADDER CONTROL PADS EX ABSORB) MISC     . isosorbide mononitrate (IMDUR) 60 MG 24 hr tablet Take 1 tablet (60 mg total) by mouth 2 (two) times daily. 180 tablet 3  . lidocaine-prilocaine (EMLA) cream Apply cream to areola one hour prior to procedure and cover with plastic wrap. (Patient not taking: Reported on 06/24/2018) 30 g 0  . losartan (COZAAR) 25 MG tablet Take 25 mg by mouth daily.     Marland Kitchen omeprazole (PRILOSEC) 20 MG capsule Take 20 mg by mouth daily.    Marland Kitchen oxybutynin (DITROPAN) 5 MG tablet Take 5 mg by mouth 2 (two) times daily.    . potassium chloride SA (K-DUR,KLOR-CON) 20 MEQ tablet Take 1 tablet (20 mEq total) by mouth 2 (two) times daily. 60 tablet 6  . torsemide (DEMADEX) 20 MG tablet Take 1 tablet (20 mg total) by mouth 2 (two) times daily. 60 tablet 6   No current facility-administered medications for this encounter.     ECOG PERFORMANCE STATUS:  0 - Asymptomatic  REVIEW OF SYSTEMS:  Patient denies any weight loss, fatigue, weakness, fever, chills or night sweats. Patient denies any loss of vision, blurred vision. Patient denies any ringing  of the ears or hearing loss. No irregular heartbeat. Patient denies heart murmur or history of fainting. Patient denies any chest pain or pain radiating to her upper extremities. Patient denies any shortness of breath, difficulty breathing at night, cough or hemoptysis. Patient denies any swelling in the lower legs. Patient denies any nausea vomiting, vomiting of blood, or coffee ground material in the vomitus. Patient denies any stomach pain. Patient states has had normal bowel movements no significant constipation or diarrhea. Patient denies any dysuria, hematuria or significant nocturia. Patient denies any problems walking, swelling in the joints or loss of balance. Patient denies any skin  changes, loss of hair or loss of weight. Patient denies any excessive worrying or anxiety or significant depression. Patient denies any problems with insomnia. Patient denies excessive thirst, polyuria, polydipsia. Patient denies any swollen glands, patient denies easy bruising or easy bleeding. Patient denies any recent infections, allergies or URI. Patient "s visual fields have not changed significantly in recent time.    PHYSICAL EXAM: There were no vitals taken for this visit. Sedentary morbidly obese female in NAD. Right breast is wide local excision site as well as sentinel lymph node site both healing well no dominant mass or nodularity is noted in either breast in 2 positions examined. No axillary or supraclavicular adenopathy is identified.Well-developed well-nourished patient in NAD. HEENT reveals PERLA, EOMI, discs not visualized.  Oral cavity is clear. No oral mucosal lesions are identified. Neck is clear without evidence of cervical or supraclavicular adenopathy. Lungs are clear to A&P. Cardiac examination is essentially unremarkable with regular rate and rhythm without murmur rub or thrill. Abdomen is benign with no organomegaly or masses noted. Motor sensory and DTR levels are equal and symmetric in the upper and lower extremities. Cranial nerves II through XII are grossly intact. Proprioception is intact. No peripheral adenopathy or edema is identified. No motor or sensory levels are noted. Crude visual fields are within normal  range.  LABORATORY DATA: pathology reports reviewed    RADIOLOGY RESULTS:mammogram and ultrasound reviewed   IMPRESSION: stage I invasive mammary carcinoma of the right breast status post wide local excision and sentinel node biopsy in 72 year old female with previous left breast treatment back in 2008 for invasive mammary carcinoma  PLAN: this time I believe she would be a good candidate for accelerated partial breast radiation to her right breast. I discussed  the risks and benefits of treatment including placement of the balloon catheter as well as possibility of small postage stamp area of erythematous skin, fatigue, persistent thickness in the lumpectomy site all were described in detail to the patient and her daughter. They both seem to comprehend my treatment plan well. I have discussed the case with medical oncology MammaPrint will be ordered although I believe will be finished with her treatments by the time that his results are returned. I will coordinate with Dr. Barbette Hair office placement of MammoSite balloon and simulation and treatment planning for her accelerated partial breast radiation.patient also will be candidate for antiestrogen therapy after completion of radiation.  I would like to take this opportunity to thank you for allowing me to participate in the care of your patient.[name], MD

## 2018-06-26 NOTE — Progress Notes (Signed)
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  Chief complaint/ Reason for visit- discuss final pathology results and further management  Heme/Onc history: patient is a 72 year old female with a prior history of left breast cancer in 2006. 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 She has been in remission since then. She has been getting periodic mammograms and the most recent mammogram and ultrasound in August 2019 showed 0.9 x 0.9 x 0.8 cm irregular spiculated mass in the 11 o'clock position of the right breast. There was a concern for an abnormal intramammary lymph node as well. No other abnormal axillary adenopathy noted. Patient underwent ultrasound-guided biopsy of both the right breast mass as well as the axillary lymph node lymph node biopsy was negative for malignancy. Biopsy of the right breast mass showed 6 mm, grade 1 ER PR positive and HER-2/neu negative tumor.  Patient is morbidly obese and has difficulty with ambulation. She uses a cane or a walker to ambulate around the house. She sees Dr. Rockey Situ for her cardiology issues. Reports that her appetite is good and she denies any unintentional weight loss or any new aches or pains anywhere. Family history significant for breast cancer in her sister who died from breast cancer in her 48s. genetic testing was negative  Interval history- she reports pain at the lumpectomy site. She had a small seroma that was aspirated.    ECOG PS- 2 Pain scale- 4 Opioid  associated constipation- no  Review of systems- Review of Systems  Constitutional: Positive for malaise/fatigue. Negative for chills, fever and weight loss.  HENT: Negative for congestion, ear discharge and nosebleeds.   Eyes: Negative for blurred vision.  Respiratory: Negative for cough, hemoptysis, sputum production, shortness of breath and wheezing.   Cardiovascular: Positive for leg swelling. Negative for chest pain, palpitations, orthopnea and claudication.  Gastrointestinal: Negative for abdominal pain, blood in stool, constipation, diarrhea, heartburn, melena, nausea and vomiting.  Genitourinary: Negative for dysuria, flank pain, frequency, hematuria and urgency.  Musculoskeletal: Negative for back pain, joint pain and myalgias.  Skin: Negative for rash.  Neurological: Negative for dizziness, tingling, focal weakness, seizures, weakness and headaches.  Endo/Heme/Allergies: Does not bruise/bleed easily.  Psychiatric/Behavioral: Negative for depression and suicidal ideas. The patient does not have insomnia.       No Known Allergies   Past Medical History:  Diagnosis Date  . Abnormality of gait   . Breast cancer ([place]) 02/05/2005   3.5 cm invasive mammary carcinoma, T2, N0,; triple negative, whole breast radiation, cytoxan, taxotere chemotherapy.   . CHF (congestive heart failure) ([place])    March 28, 2017 echo: EF 50-55%; 1) echo 07/2011: EF 20-25%, mild concentric hypertrophy, diffuse HK, regional WMA cannot be excluded, grade 1 DD, mitral valve with mild regurg, LA mildly dilated). 2) EF 40-45%, mild AS,                    . Complication of anesthesia 2006   pt states "hard to wake up" after L breast surgery   . Edema   . GERD (gastroesophageal  reflux disease)   . Headache   . Heart murmur   . Hypertension   . Hypokinesis    global, EF 35-45 % from Echo.  . Malignant neoplasm of breast (female), unspecified site 06/14/2018   T1b, N0; ER/PR positive, HER-2 negative.  .  Neuropathy   . Obesity   . Pain in joint, site unspecified   . Pneumonia   . Sleep apnea   . Tinea pedis   . Unspecified hearing loss    bilateral     Past Surgical History:  Procedure Laterality Date  . BREAST LUMPECTOMY  2006   left breast   . CATARACT EXTRACTION     left eye  . HERNIA REPAIR    . PARTIAL MASTECTOMY WITH NEEDLE LOCALIZATION Right 06/14/2018   Procedure: PARTIAL MASTECTOMY WITH NEEDLE LOCALIZATION;  Surgeon: Robert Bellow, MD;  Location: [facility] ORS;  Service: General;  Laterality: Right;  . SENTINEL NODE BIOPSY Right 06/14/2018   Procedure: SENTINEL NODE BIOPSY;  Surgeon: Robert Bellow, MD;  Location: [facility] ORS;  Service: General;  Laterality: Right;  . TUBAL LIGATION      Social History   Socioeconomic History  . Marital status: Divorced    Spouse name: Not on file  . Number of children: Not on file  . Years of education: Not on file  . Highest education level: Not on file  Occupational History  . Occupation: retired  Scientific laboratory technician  . Financial resource strain: Not on file  . Food insecurity:    Worry: Not on file    Inability: Not on file  . Transportation needs:    Medical: Not on file    Non-medical: Not on file  Tobacco Use  . Smoking status: Former Smoker    Packs/day: 0.50    Years: 30.00    Pack years: 15.00    Types: Cigarettes    Last attempt to quit: 07/14/2000    Years since quitting: 17.9  . Smokeless tobacco: Never Used  Substance and Sexual Activity  . Alcohol use: No  . Drug use: Not Currently    Types: Marijuana    Comment: endorses prior marijuana use 1999  . Sexual activity: Never  Lifestyle  . Physical activity:    Days per week: Not on file    Minutes per session: Not on file  . Stress: Not on file  Relationships  . Social connections:    Talks on phone: Not on file    Gets together: Not on file    Attends religious service: Not on file    Active member of club or organization: Not on file    Attends  meetings of clubs or organizations: Not on file    Relationship status: Not on file  . Intimate partner violence:    Fear of current or ex partner: Not on file    Emotionally abused: Not on file    Physically abused: Not on file    Forced sexual activity: Not on file  Other Topics Concern  . Not on file  Social History Narrative  . Not on file    Family History  Problem Relation Age of Onset  . Hypertension Mother        deceased 7  . Cataracts Mother   . Breast cancer Other 19       maternal half-sister; deceased 41  . Coronary artery disease Other      Current Outpatient Medications:  .  aspirin EC 81 MG tablet,  Take 81 mg by mouth daily.  , Disp: , Rfl:  .  cloNIDine (CATAPRES) 0.2 MG tablet, Take 1 tablet (0.2 mg total) by mouth 2 (two) times daily., Disp: 60 tablet, Rfl: 11 .  HYDROcodone-acetaminophen (NORCO/VICODIN) 5-325 MG tablet, Take 1 tablet by mouth every 4 (four) hours as needed for moderate pain., Disp: 20 tablet, Rfl: 0 .  Incontinence Supply Disposable (BLADDER CONTROL PADS EX ABSORB) MISC, , Disp: , Rfl:  .  Incontinence Supply Disposable (BLADDER CONTROL PADS EX ABSORB) MISC, , Disp: , Rfl:  .  losartan (COZAAR) 25 MG tablet, Take 25 mg by mouth daily. , Disp: , Rfl:  .  omeprazole (PRILOSEC) 20 MG capsule, Take 20 mg by mouth daily., Disp: , Rfl:  .  oxybutynin (DITROPAN) 5 MG tablet, Take 5 mg by mouth 2 (two) times daily., Disp: , Rfl:  .  potassium chloride SA (K-DUR,KLOR-CON) 20 MEQ tablet, Take 1 tablet (20 mEq total) by mouth 2 (two) times daily., Disp: 60 tablet, Rfl: 6 .  torsemide (DEMADEX) 20 MG tablet, Take 1 tablet (20 mg total) by mouth 2 (two) times daily., Disp: 60 tablet, Rfl: 6 .  cefadroxil (DURICEF) 500 MG capsule, Take 1 capsule (500 mg total) by mouth 2 (two) times daily. Start this medication one hour before office procedure on 06-30-18, Disp: 20 capsule, Rfl: 0 .  clotrimazole (LOTRIMIN) 1 % cream, Apply 1 application topically 2 (two)  times daily. , Disp: , Rfl: 1 .  isosorbide mononitrate (IMDUR) 60 MG 24 hr tablet, Take 1 tablet (60 mg total) by mouth 2 (two) times daily., Disp: 180 tablet, Rfl: 3 .  lidocaine-prilocaine (EMLA) cream, Apply cream to areola one hour prior to procedure and cover with plastic wrap. (Patient not taking: Reported on 06/24/2018), Disp: 30 g, Rfl: 0  Physical exam:  Vitals:   06/24/18 1014  BP: 126/83  Pulse: 70  Resp: 18  Temp: (!) 97.3 F (36.3 C)  TempSrc: Tympanic  Weight: 284 lb (128.8 kg)  Height: 5' 9"  (1.753 m)   Physical Exam  Constitutional: She is oriented to person, place, and time.  Patient is obese. She ambulates with a wheelchair.   HENT:  Head: Normocephalic and atraumatic.  Eyes: Pupils are equal, round, and reactive to light. EOM are normal.  Neck: Normal range of motion.  Cardiovascular: Normal rate, regular rhythm and normal heart sounds.  Pulmonary/Chest: Effort normal and breath sounds normal.  Abdominal: Soft. Bowel sounds are normal.  Musculoskeletal: She exhibits edema.  Neurological: She is alert and oriented to person, place, and time.  Skin: Skin is warm and dry.   she is s/p left lumpectomy with healing surgical scar. No evidence of inflammation/ swelling noted around the incision or chest wall  CMP Latest Ref Rng & Units 06/07/2018  Glucose 70 - 99 mg/dL 105(H)  BUN 8 - 23 mg/dL 27(H)  Creatinine 0.44 - 1.00 mg/dL 0.90  Sodium 135 - 145 mmol/L 141  Potassium 3.5 - 5.1 mmol/L 5.1  Chloride 98 - 111 mmol/L 103  CO2 22 - 32 mmol/L 29  Calcium 8.9 - 10.3 mg/dL 9.1  Total Protein 6.5 - 8.1 g/dL -  Total Bilirubin 0.3 - 1.2 mg/dL -  Alkaline Phos 38 - 126 U/L -  AST 15 - 41 U/L -  ALT 14 - 54 U/L -   CBC Latest Ref Rng & Units 06/07/2018  WBC 3.6 - 11.0 K/uL 7.4  Hemoglobin 12.0 - 16.0 g/dL 13.2  Hematocrit  35.0 - 47.0 % 39.9  Platelets 150 - 440 K/uL 258    No images are attached to the encounter.  Nm Sentinel Node Injection  Result Date:  06/14/2018 CLINICAL DATA:  Right breast cancer. EXAM: NUCLEAR MEDICINE BREAST LYMPHOSCINTIGRAPHY TECHNIQUE: Intradermal injection of radiopharmaceutical was performed at the 12 o'clock, 3 o'clock, 6 o'clock, and 9 o'clock positions around the right nipple. The patient was then sent to the operating room where the sentinel node(s) were identified and removed by the surgeon. RADIOPHARMACEUTICALS:  Total of 1 mCi Millipore-filtered Technetium-25msulfur colloid, injected in four aliquots of 0.25 mCi each. IMPRESSION: Uncomplicated intradermal injection of a total of 1 mCi Technetium-954mulfur colloid for purposes of sentinel node identification. Electronically Signed   By: HeKathreen Devoid On: 06/14/2018 10:08   Mm Breast Surgical Specimen  Result Date: 06/14/2018 CLINICAL DATA:  Post right breast lumpectomy. EXAM: SPECIMEN RADIOGRAPH OF THE RIGHT BREAST COMPARISON:  Previous exam(s). FINDINGS: Status post excision of the right breast. The wire tip and biopsy marker clip are present and are marked for pathology. IMPRESSION: Specimen radiograph of the right breast. Electronically Signed   By: DoFidela Salisbury.D.   On: 06/14/2018 12:35   Dg Bone Density  Result Date: 06/02/2018 EXAM: DUAL X-RAY ABSORPTIOMETRY (DXA) FOR BONE MINERAL DENSITY IMPRESSION: Dear Dr RaJanese BanksYour patient BeJacqueleen Pulverompleted a BMD test on 06/02/2018 using the LuArabanalysis version: 14.10) manufactured by GEEM[employer]The following summarizes the results of our evaluation. PATIENT BIOGRAPHICAL: Name: JoPatriciann, Bechtatient ID: [id number] Date: 02November 24, 1947eight: 69.0 in. Gender: Female Exam Date: 06/02/2018 Weight: 286.2 lbs. Indications: Advanced Age, Breast CA, COPD, History of Chemo, Postmenopausal Fractures: Treatments: Prilosec ASSESSMENT: The BMD measured at Femur Neck Left is 0.966 g/cm2 with a T-score of -0.5. This patient is considered normal according to [facility] criteria. L-2 was excluded due to degenerative changes. Site Region Measured Measured WHO Young Adult BMD Date       Age      Classification T-score AP Spine L1-L4 (L2) 06/02/[age] 72.5 Normal 0.2 1.212 g/cm2 DualFemur Neck Left 06/02/2018 72.5 Normal -0.5 0.966 g/cm2 DualFemur Total Mean 06/02/2018 72.5 Normal -0.1 1.001 g/cm2 World Health Organization ([facility] LLCcriteria for post-menopausal, Caucasian Women: Normal:       T-score at or above -1 SD Osteopenia:   T-score between -1 and -2.5 SD Osteoporosis: T-score at or below -2.5 SD RECOMMENDATIONS: 1. All patients should optimize calcium and vitamin D intake. 2. Consider FDA-approved medical therapies in postmenopausal women and men aged 5068ears and older, based on the following: a. A hip or vertebral(clinical or morphometric) fracture b. T-score < -2.5 at the femoral neck or spine after appropriate evaluation to exclude secondary causes c. Low bone mass (T-score between -1.0 and -2.5 at the femoral neck or spine) and a 10-year probability of a hip fracture > 3% or a 10-year probability of a major osteoporosis-related fracture > 20% based on the [place]-adapted WHO algorithm d. Clinician judgment and/or patient preferences may indicate treatment for people with 10-year fracture probabilities above or below these levels FOLLOW-UP: Patients with diagnosis of osteoporosis or at high risk for fracture should have regular bone mineral density tests. For patients eligible for Medicare, routine testing is allowed once every 2 years. The testing frequency can be increased to one year for patients who have rapidly progressing disease, those who are receiving or discontinuing medical therapy to restore bone mass, or have additional  risk factors. I have reviewed this report, and agree with the above findings. [facility] Radiology Electronically Signed   By: Lowella Grip III M.D.   On: 06/02/2018 14:52   US Breast Complete Uni Right Inc Axilla  Result Date:  06/22/2018 Ultrasound examination of the wide excision site was undertaken to determine if the patient would be a candidate for accelerated partial breast radiation.  A non-clinically palpable seroma cavity measuring 1.26 x 2.16 x 3.2 cm is noted.  Minimal distance the overlying skin is 1.13 cm.  Potential candidate for accelerated partial breast radiation.  Ultrasound examination of the axilla showed a seroma cavity measuring 2.1 x 2.6 x 2.7 cm.  The patient was amenable to aspiration.  Skin cleansing with alcohol followed by 1 cc of 1% plain Xylocaine.  A 20-gauge needle was used to decompress the area with approximately 50-70% reduction after 10 cc of clear serous fluid was aspirated.  The procedure was well-tolerated.  Mm Right Plc Breast Loc Dev   1st Lesion  Inc Mammo Guide  Result Date: 06/14/2018 CLINICAL DATA:  Preoperative needle localization, prior to lumpectomy of right breast 11 o'clock mass. EXAM: NEEDLE LOCALIZATION OF THE RIGHT BREAST WITH MAMMO GUIDANCE COMPARISON:  Previous exams. FINDINGS: Patient presents for needle localization prior to right breast lumpectomy. I met with the patient and we discussed the procedure of needle localization including benefits and alternatives. We discussed the high likelihood of a successful procedure. We discussed the risks of the procedure, including infection, bleeding, tissue injury, and further surgery. Informed, written consent was given. The usual time-out protocol was performed immediately prior to the procedure. Using mammographic guidance, sterile technique, 1% lidocaine and a 5 modified Kopans needle, a ribbon shaped containing mass in the 11 o'clock right breast localized using superior approach. The images were marked for Dr. Bary Castilla. IMPRESSION: Needle localization right breast. No apparent complications. Electronically Signed   By: Fidela Salisbury M.D.   On: 06/14/2018 08:43     Assessment and plan- Patient is a 72 y.o. female with  invasive mammary carcinoma of the right breast Stage I A T1bN0M0 ER PR positive her 2 negative grade 2 s/p lumpectomy.   I discussed the results of pathology with the patient in detail. She had a 7 mm grade 2 invasive mammary carcinoma ER PR positive her 2 negative. Lumpectomy has achieved clear margins. Discussed oncotype, how results are interpreted. Since she is >50 if she had a low or intermediate score she would not need adjuvant chemotherapy. Chemotherapy would only be warranted for high risk oncotype score of greater than /= 26. I asked her if would accept chemotherapy if that were to be an option. She said she would. Her PS is 2 but we could consider TC chemotherapy for her if warranted. Oncotype testing will therefore be sent at this time  Patient has met with Rad onc today and they plan to do accelerated partial breast RT which she can potentially complete by the time oncotype is back.  I will see her back in 2 weeks time to discuss oncotype results and further management    Visit Diagnosis 1. Malignant neoplasm of right breast in female, estrogen receptor positive, unspecified site of breast ([place])   2. Goals of care, counseling/discussion      Dr. Randa Evens, MD, MPH [facility] at [address] [facility] [phone] 06/26/2018 7:49 AM

## 2018-06-30 ENCOUNTER — Other Ambulatory Visit: Payer: Self-pay

## 2018-06-30 ENCOUNTER — Ambulatory Visit (INDEPENDENT_AMBULATORY_CARE_PROVIDER_SITE_OTHER): Payer: Medicare HMO

## 2018-06-30 ENCOUNTER — Encounter: Payer: Self-pay | Admitting: General Surgery

## 2018-06-30 ENCOUNTER — Ambulatory Visit: Payer: Medicare HMO | Admitting: General Surgery

## 2018-06-30 VITALS — BP 146/84 | HR 80 | Temp 97.5°F | Resp 22 | Ht 69.0 in | Wt 284.0 lb

## 2018-06-30 DIAGNOSIS — Z170 Estrogen receptor positive status [ER+]: Secondary | ICD-10-CM | POA: Diagnosis not present

## 2018-06-30 DIAGNOSIS — C50411 Malignant neoplasm of upper-outer quadrant of right female breast: Secondary | ICD-10-CM

## 2018-06-30 NOTE — Progress Notes (Addendum)
Patient ID: [name], female   DOB: 07/22/1946, 72 y.o.   MRN: [id number]  Chief Complaint  Patient presents with  . Procedure    HPI [name] is a 72 y.o. female.  Here for right mammosite.   HPI  Past Medical History:  Diagnosis Date  . Abnormality of gait   . Breast cancer ([place]) 02/05/2005   3.5 cm invasive mammary carcinoma, T2, N0,; triple negative, whole breast radiation, cytoxan, taxotere chemotherapy.   . CHF (congestive heart failure) ([place])    March 28, 2017 echo: EF 50-55%; 1) echo 07/2011: EF 20-25%, mild concentric hypertrophy, diffuse HK, regional WMA cannot be excluded, grade 1 DD, mitral valve with mild regurg, LA mildly dilated). 2) EF 40-45%, mild AS,                    . Complication of anesthesia 2006   pt states "hard to wake up" after L breast surgery   . Edema   . GERD (gastroesophageal reflux disease)   . Headache   . Heart murmur   . Hypertension   . Hypokinesis    global, EF 35-45 % from Echo.  . Malignant neoplasm of breast (female), unspecified site 06/14/2018   T1b, N0; ER/PR positive, HER-2 negative.  . Neuropathy   . Obesity   . Pain in joint, site unspecified   . Pneumonia   . Sleep apnea   . Tinea pedis   . Unspecified hearing loss    bilateral    Past Surgical History:  Procedure Laterality Date  . BREAST LUMPECTOMY  2006   left breast   . CATARACT EXTRACTION     left eye  . HERNIA REPAIR    . PARTIAL MASTECTOMY WITH NEEDLE LOCALIZATION Right 06/14/2018   Procedure: PARTIAL MASTECTOMY WITH NEEDLE LOCALIZATION;  Surgeon: Robert Bellow, MD;  Location: [facility] ORS;  Service: General;  Laterality: Right;  . SENTINEL NODE BIOPSY Right 06/14/2018   Procedure: SENTINEL NODE BIOPSY;  Surgeon: Robert Bellow, MD;  Location: [facility] ORS;  Service: General;  Laterality: Right;  . TUBAL LIGATION      Family History  Problem Relation Age of Onset  . Hypertension Mother        deceased 82  . Cataracts Mother   . Breast  cancer Other 73       maternal half-sister; deceased 82  . Coronary artery disease Other     Social History Social History   Tobacco Use  . Smoking status: Former Smoker    Packs/day: 0.50    Years: 30.00    Pack years: 15.00    Types: Cigarettes    Last attempt to quit: 07/14/2000    Years since quitting: 17.9  . Smokeless tobacco: Never Used  Substance Use Topics  . Alcohol use: No  . Drug use: Not Currently    Types: Marijuana    Comment: endorses prior marijuana use 1999    No Known Allergies  Current Outpatient Medications  Medication Sig Dispense Refill  . aspirin EC 81 MG tablet Take 81 mg by mouth daily.      . cefadroxil (DURICEF) 500 MG capsule Take 1 capsule (500 mg total) by mouth 2 (two) times daily. Start this medication one hour before office procedure on 06-30-18 20 capsule 0  . cloNIDine (CATAPRES) 0.2 MG tablet Take 1 tablet (0.2 mg total) by mouth 2 (two) times daily. 60 tablet 11  . clotrimazole (LOTRIMIN) 1 % cream Apply 1 application  topically 2 (two) times daily.   1  . Incontinence Supply Disposable (BLADDER CONTROL PADS EX ABSORB) MISC     . Incontinence Supply Disposable (BLADDER CONTROL PADS EX ABSORB) MISC     . lidocaine-prilocaine (EMLA) cream Apply cream to areola one hour prior to procedure and cover with plastic wrap. 30 g 0  . losartan (COZAAR) 25 MG tablet Take 25 mg by mouth daily.     Marland Kitchen omeprazole (PRILOSEC) 20 MG capsule Take 20 mg by mouth daily.    Marland Kitchen oxybutynin (DITROPAN) 5 MG tablet Take 5 mg by mouth 2 (two) times daily.    . potassium chloride SA (K-DUR,KLOR-CON) 20 MEQ tablet Take 1 tablet (20 mEq total) by mouth 2 (two) times daily. 60 tablet 6  . isosorbide mononitrate (IMDUR) 60 MG 24 hr tablet Take 1 tablet (60 mg total) by mouth 2 (two) times daily. 180 tablet 3  . torsemide (DEMADEX) 20 MG tablet Take 1 tablet (20 mg total) by mouth 2 (two) times daily. 60 tablet 6   No current facility-administered medications for this visit.      Review of Systems Review of Systems  Constitutional: Positive for fatigue.  Respiratory: Positive for shortness of breath.   Cardiovascular: Negative.     Blood pressure (!) 146/84, pulse 80, temperature (!) 97.5 F (36.4 C), temperature source Skin, resp. rate (!) 22, height _0  (1.753 m), weight 284 lb (128.8 kg), SpO2 94 %.  Physical Exam Physical Exam  Constitutional: She is oriented to person, place, and time. She appears well-developed and well-nourished.  Pulmonary/Chest:    Neurological: She is alert and oriented to person, place, and time.  Skin: Skin is warm and dry.  Psychiatric: Her behavior is normal.    Data Reviewed Ultrasound examination again showed cavity within about 1.2 cm of the skin.  ChloraPrep was applied followed by 10 cc of 0.5% Xylocaine with 0.25% Marcaine with 1 to 200,000 units of epinephrine.  The area was recleansed with ChloraPrep and draped.  An 11-gauge blade was used to make the incision followed by the 8 mm trocar.  The cavity evaluation device was inserted.  As it inflated felt a single suture pop.  Subsequent ultrasound examination showed one area where the balloon came to within 0.68 cm of the skin.  An adequate spacing for safe treatment without skin damage.  Multiple attempts to reposition the balloon were unsuccessful.  The catheter was removed, benzoin and Steri-Strips followed by Telfa and Tegaderm dressing to the catheter site completed.  The patient was informed that whole breast radiation as she experienced in 2006 will be necessary.  Assessment    Unsuccessful placement of a MammoSite balloon.    Plan    Arrangements have been made for follow-up with Dr. Baruch Gouty on July 08, 2018 at 11 AM for whole breast simulation.    The patient will complete her present course of antibiotics, Duricef 500 mg p.o. twice daily based on the procedure and the evidence of skin thickening.  HPI, Physical Exam, Assessment and Plan have been  scribed under the direction and in the presence of Robert Bellow, MD. Karie Fetch, RN  I have completed the exam and reviewed the above documentation for accuracy and completeness.  I agree with the above.  Haematologist has been used and any errors in dictation or transcription are unintentional.  Hervey Ard, M.D., F.A.C.S.  [name] 06/30/2018, 3:30 PM

## 2018-06-30 NOTE — Patient Instructions (Addendum)
Appointment with Dr Baruch Gouty Oct 31 at 11:00 [place]. All previous appointments will be cancelled.  Continue to take all the antibiotics

## 2018-07-02 ENCOUNTER — Ambulatory Visit: Payer: Medicare HMO

## 2018-07-02 ENCOUNTER — Encounter: Payer: Self-pay | Admitting: Oncology

## 2018-07-05 ENCOUNTER — Ambulatory Visit: Payer: Medicare HMO

## 2018-07-06 ENCOUNTER — Ambulatory Visit: Payer: Medicare HMO

## 2018-07-07 ENCOUNTER — Ambulatory Visit: Payer: Medicare HMO

## 2018-07-08 ENCOUNTER — Ambulatory Visit: Payer: Medicare HMO

## 2018-07-08 ENCOUNTER — Ambulatory Visit
Admission: RE | Admit: 2018-07-08 | Discharge: 2018-07-08 | Disposition: A | Discharge status: Home / Self Care | Payer: Medicare HMO | Source: Ambulatory Visit | Attending: Radiation Oncology | Admitting: Radiation Oncology

## 2018-07-08 DIAGNOSIS — C50411 Malignant neoplasm of upper-outer quadrant of right female breast: Secondary | ICD-10-CM | POA: Diagnosis not present

## 2018-07-08 DIAGNOSIS — Z170 Estrogen receptor positive status [ER+]: Secondary | ICD-10-CM | POA: Insufficient documentation

## 2018-07-09 ENCOUNTER — Ambulatory Visit: Payer: Medicare HMO

## 2018-07-09 ENCOUNTER — Other Ambulatory Visit: Payer: Self-pay | Admitting: *Deleted

## 2018-07-09 DIAGNOSIS — C50411 Malignant neoplasm of upper-outer quadrant of right female breast: Secondary | ICD-10-CM

## 2018-07-09 DIAGNOSIS — Z170 Estrogen receptor positive status [ER+]: Secondary | ICD-10-CM | POA: Diagnosis not present

## 2018-07-13 ENCOUNTER — Encounter: Payer: Self-pay | Admitting: Oncology

## 2018-07-13 ENCOUNTER — Inpatient Hospital Stay: Payer: Medicare HMO | Attending: Oncology | Admitting: Oncology

## 2018-07-13 VITALS — BP 125/74 | HR 64 | Temp 97.9°F | Resp 18 | Ht 69.0 in | Wt 277.0 lb

## 2018-07-13 DIAGNOSIS — Z7982 Long term (current) use of aspirin: Secondary | ICD-10-CM | POA: Insufficient documentation

## 2018-07-13 DIAGNOSIS — Z170 Estrogen receptor positive status [ER+]: Secondary | ICD-10-CM | POA: Insufficient documentation

## 2018-07-13 DIAGNOSIS — Z79899 Other long term (current) drug therapy: Secondary | ICD-10-CM | POA: Diagnosis not present

## 2018-07-13 DIAGNOSIS — C50912 Malignant neoplasm of unspecified site of left female breast: Secondary | ICD-10-CM | POA: Diagnosis not present

## 2018-07-13 DIAGNOSIS — C50911 Malignant neoplasm of unspecified site of right female breast: Secondary | ICD-10-CM | POA: Insufficient documentation

## 2018-07-13 DIAGNOSIS — Z87891 Personal history of nicotine dependence: Secondary | ICD-10-CM | POA: Insufficient documentation

## 2018-07-13 DIAGNOSIS — I10 Essential (primary) hypertension: Secondary | ICD-10-CM | POA: Insufficient documentation

## 2018-07-13 DIAGNOSIS — Z7189 Other specified counseling: Secondary | ICD-10-CM

## 2018-07-14 NOTE — Progress Notes (Signed)
Radiation Oncology Follow up Note  Name: [name]   Date:   06/24/2018 MRN:  [id number] DOB: 1945/12/30    This 72 y.o. female Who had attempted placement of MammoSite balloon for etc. Partial breast radiation although could not be performed REFERRING PROVIDER: Donnie Coffin, MD  HPI: . patient is a 72 year old female well known to our department having been treated to her left breast back in 2008.she was found to have an abnormal mammogram of the right breast showinga mass in the upper outer quadrant of the right breastwith spiculated margins. This was confirmed on ultrasound and she underwent targeted ultrasound of the spiculated mass 11:00 position 7 cm from the nipple which was positive for invasive mammary carcinoma.tumor was strongly ER/PR positive HER-2/neu not overexpressed. Inframammary lymph node was also biopsy was negative for malignancy.she went on to have a wide local excision and sentinel node biopsy. Tumor measured 7 mm with margins clear at 3 mm. Tumor was overall grade 2.7 lymph nodes including sentinel nodes were all negative for metastatic disease. Patient had attempted placement of MammoSite balloon catheter although based on its close proximity to the skin surface could not be performed safely. She was referred back to [facility] where anticipation of whole breast radiation therapy was made.  COMPLICATIONS OF TREATMENT: none  FOLLOW UP COMPLIANCE: keeps appointments   PHYSICAL EXAM:  There were no vitals taken for this visit. Well-developed well-nourished patient in NAD. HEENT reveals PERLA, EOMI, discs not visualized.  Oral cavity is clear. No oral mucosal lesions are identified. Neck is clear without evidence of cervical or supraclavicular adenopathy. Lungs are clear to A&P. Cardiac examination is essentially unremarkable with regular rate and rhythm without murmur rub or thrill. Abdomen is benign with no organomegaly or masses noted. Motor sensory and DTR  levels are equal and symmetric in the upper and lower extremities. Cranial nerves II through XII are grossly intact. Proprioception is intact. No peripheral adenopathy or edema is identified. No motor or sensory levels are noted. Crude visual fields are within normal range.  RADIOLOGY RESULTS: no current films for review  PLAN: since the patient cannot have MammoSite balloon placed for accelerated partial breast radiation we have decided to go ahead with hypofractionated radiation therapy to her right breast. Will treat her over 4 weeks. See no reason for scar boost. Risks and benefits of external beam I per fraction whole breast radiation including skin reaction fatigue alteration of blood counts possible inclusion of superficial lung all were discussed with the patient. She seems to comprehend my treatment plan well. I have personally set up and ordered CT simulation for hyperfractionated course of treatment.  I would like to take this opportunity to thank you for allowing me to participate in the care of your patient.[name], MD

## 2018-07-15 ENCOUNTER — Ambulatory Visit
Admission: RE | Admit: 2018-07-15 | Discharge: 2018-07-15 | Disposition: A | Discharge status: Home / Self Care | Payer: Medicare HMO | Source: Ambulatory Visit | Attending: Radiation Oncology | Admitting: Radiation Oncology

## 2018-07-15 DIAGNOSIS — C50411 Malignant neoplasm of upper-outer quadrant of right female breast: Secondary | ICD-10-CM | POA: Diagnosis not present

## 2018-07-15 NOTE — Progress Notes (Signed)
Hematology/Oncology Consult note 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   Chief complaint/ Reason for visit- discuss oncotype results and further management  Heme/Onc history: patient is a 72 year old female with a prior history of left breast cancer in 2006. 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 She has been in remission since then. She has been getting periodic mammograms and the most recent mammogram and ultrasound in August 2019 showed 0.9 x 0.9 x 0.8 cm irregular spiculated mass in the 11 o'clock position of the right breast. There was a concern for an abnormal intramammary lymph node as well. No other abnormal axillary adenopathy noted. Patient underwent ultrasound-guided biopsy of both the right breast mass as well as the axillary lymph node lymph node biopsy was negative for malignancy. Biopsy of the right breast mass showed 6 mm, grade 1 ER PR positive and HER-2/neu negative tumor.  Patient is morbidly obese and has difficulty with ambulation. She uses a cane or a walker to ambulate around the house. She sees Dr. Rockey Situ for her cardiology issues. Reports that her appetite is good and she denies any unintentional weight loss or any new aches or pains anywhere. Family history significant for breast cancer in her sister who died from breast cancer in her 96s. genetic testing was negative  Final pathology showed 7 mm grade 2 invasive mammary carcinoma ER PR positive her 2 negative. Negative nodes. Clear margins  Oncotype score came as low  risk 8 and she does not require adjuvant chemotherapy  Interval history-initially plan was to do accelerated partial breast radiation but later on she was not deemed a candidate for the same and hence will be undergoing conventional radiation treatment which will be starting next week.  She feels at her baseline today.  She ambulates with a walker.  ECOG PS- 2 Pain scale- 0   Review of systems- Review of Systems  Constitutional: Positive for malaise/fatigue. Negative for chills, fever and weight loss.  HENT: Negative for congestion, ear discharge and nosebleeds.   Eyes: Negative for blurred vision.  Respiratory: Negative for cough, hemoptysis, sputum production, shortness of breath and wheezing.   Cardiovascular: Negative for chest pain, palpitations, orthopnea and claudication.  Gastrointestinal: Negative for abdominal pain, blood in stool, constipation, diarrhea, heartburn, melena, nausea and vomiting.  Genitourinary: Negative for dysuria, flank pain, frequency, hematuria and urgency.  Musculoskeletal: Positive for back pain. Negative for joint pain and myalgias.  Skin: Negative for rash.  Neurological: Negative for dizziness, tingling, focal weakness, seizures, weakness and headaches.  Endo/Heme/Allergies: Does not bruise/bleed easily.  Psychiatric/Behavioral: Negative for depression and suicidal ideas. The patient does not have insomnia.       No Known Allergies   Past Medical History:  Diagnosis Date  . Abnormality of gait   . Breast cancer ([place]) 02/05/2005   3.5 cm invasive mammary carcinoma, T2, N0,; triple negative, whole breast radiation, cytoxan, taxotere chemotherapy.   . CHF (congestive heart failure) ([place])    March 28, 2017 echo: EF 50-55%; 1) echo 07/2011: EF 20-25%, mild concentric hypertrophy, diffuse HK, regional WMA cannot be excluded,  grade 1 DD, mitral valve with mild regurg, LA mildly dilated). 2) EF 40-45%, mild AS,                    . Complication of  anesthesia 2006   pt states "hard to wake up" after L breast surgery   . Edema   . GERD (gastroesophageal reflux disease)   . Headache   . Heart murmur   . Hypertension   . Hypokinesis    global, EF 35-45 % from Echo.  . Malignant neoplasm of breast (female), unspecified site 06/14/2018   T1b, N0; ER/PR positive, HER-2 negative.  . Neuropathy   . Obesity   . Pain in joint, site unspecified   . Pneumonia   . Sleep apnea   . Tinea pedis   . Unspecified hearing loss    bilateral     Past Surgical History:  Procedure Laterality Date  . BREAST LUMPECTOMY  2006   left breast   . CATARACT EXTRACTION     left eye  . HERNIA REPAIR    . PARTIAL MASTECTOMY WITH NEEDLE LOCALIZATION Right 06/14/2018   Procedure: PARTIAL MASTECTOMY WITH NEEDLE LOCALIZATION;  Surgeon: Robert Bellow, MD;  Location: [facility] ORS;  Service: General;  Laterality: Right;  . SENTINEL NODE BIOPSY Right 06/14/2018   Procedure: SENTINEL NODE BIOPSY;  Surgeon: Robert Bellow, MD;  Location: [facility] ORS;  Service: General;  Laterality: Right;  . TUBAL LIGATION      Social History   Socioeconomic History  . Marital status: Divorced    Spouse name: Not on file  . Number of children: Not on file  . Years of education: Not on file  . Highest education level: Not on file  Occupational History  . Occupation: retired  Scientific laboratory technician  . Financial resource strain: Not on file  . Food insecurity:    Worry: Not on file    Inability: Not on file  . Transportation needs:    Medical: Not on file    Non-medical: Not on file  Tobacco Use  . Smoking status: Former Smoker    Packs/day: 0.50    Years: 30.00    Pack years: 15.00    Types: Cigarettes    Last attempt to quit: 07/14/2000    Years since quitting: 18.0  . Smokeless tobacco: Never Used  Substance and Sexual Activity  . Alcohol use: No  . Drug use: Not Currently    Types: Marijuana    Comment: endorses prior marijuana use 1999  . Sexual activity: Never    Lifestyle  . Physical activity:    Days per week: Not on file    Minutes per session: Not on file  . Stress: Not on file  Relationships  . Social connections:    Talks on phone: Not on file    Gets together: Not on file    Attends religious service: Not on file    Active member of club or organization: Not on file    Attends meetings of clubs or organizations: Not on file    Relationship status: Not on file  . Intimate partner violence:    Fear of current or ex partner: Not on file    Emotionally abused: Not on file    Physically abused: Not on file    Forced sexual activity: Not on file  Other Topics Concern  . Not on file  Social History Narrative  . Not on file    Family History  Problem Relation Age of Onset  . Hypertension Mother        deceased 42  . Cataracts Mother   . Breast cancer Other 18       maternal half-sister; deceased 5  . Coronary artery disease Other      Current Outpatient Medications:  .  aspirin EC 81 MG tablet, Take 81 mg by mouth daily.  , Disp: , Rfl:  .  cloNIDine (CATAPRES) 0.2 MG tablet, Take 1 tablet (0.2 mg total) by mouth 2 (two) times daily., Disp: 60 tablet, Rfl: 11 .  Incontinence Supply Disposable (BLADDER CONTROL PADS EX ABSORB) MISC, , Disp: , Rfl:  .  Incontinence Supply Disposable (BLADDER CONTROL PADS EX ABSORB) MISC, , Disp: , Rfl:  .  isosorbide mononitrate (IMDUR) 60 MG 24 hr tablet, Take 1 tablet (60 mg total) by mouth 2 (two) times daily., Disp: 180 tablet, Rfl: 3 .  losartan (COZAAR) 25 MG tablet, Take 25 mg by mouth daily. , Disp: , Rfl:  .  omeprazole (PRILOSEC) 20 MG capsule, Take 20 mg by mouth daily., Disp: , Rfl:  .  oxybutynin (DITROPAN) 5 MG tablet, Take 5 mg by mouth 2 (two) times daily., Disp: , Rfl:  .  potassium chloride SA (K-DUR,KLOR-CON) 20 MEQ tablet, Take 1 tablet (20 mEq total) by mouth 2 (two) times daily., Disp: 60 tablet, Rfl: 6 .  torsemide (DEMADEX) 20 MG tablet, Take 1 tablet (20 mg total) by mouth  2 (two) times daily., Disp: 60 tablet, Rfl: 6 .  cefadroxil (DURICEF) 500 MG capsule, Take 1 capsule (500 mg total) by mouth 2 (two) times daily. Start this medication one hour before office procedure on 06-30-18 (Patient not taking: Reported on 07/13/2018), Disp: 20 capsule, Rfl: 0 .  clotrimazole (LOTRIMIN) 1 % cream, Apply 1 application topically 2 (two) times daily. , Disp: , Rfl: 1 .  lidocaine-prilocaine (EMLA) cream, Apply cream to areola one hour prior to procedure and cover with plastic wrap. (Patient not taking: Reported on 07/13/2018), Disp: 30 g, Rfl: 0  Physical exam:  Vitals:   07/13/18 1423  BP: 125/74  Pulse: 64  Resp: 18  Temp: 97.9 F (36.6 C)  TempSrc: Tympanic  SpO2: 98%  Weight: 277 lb (125.6 kg)  Height: _0  (1.753 m)   Physical Exam  Constitutional: She is oriented to person, place, and time.  Patient is obese and walks with a stooped gait with the help of a walker  HENT:  Head: Normocephalic and atraumatic.  Eyes: Pupils are equal, round, and reactive to light. EOM are normal.  Neck: Normal range of motion.  Cardiovascular: Normal rate, regular rhythm and normal heart sounds.  Pulmonary/Chest: Effort normal and breath sounds normal.  Abdominal: Soft. Bowel sounds are normal.  Musculoskeletal: She exhibits edema (Bilateral +1).  Neurological: She is alert and oriented to person, place, and time.  Skin: Skin is warm and dry.     CMP Latest Ref Rng & Units 06/07/2018  Glucose 70 - 99 mg/dL 105(H)  BUN 8 - 23 mg/dL 27(H)  Creatinine 0.44 - 1.00 mg/dL 0.90  Sodium 135 - 145 mmol/L 141  Potassium 3.5 - 5.1 mmol/L 5.1  Chloride 98 - 111 mmol/L 103  CO2 22 - 32 mmol/L 29  Calcium 8.9 - 10.3 mg/dL 9.1  Total Protein 6.5 - 8.1 g/dL -  Total Bilirubin 0.3 - 1.2 mg/dL -  Alkaline Phos 38 - 126 U/L -  AST 15 - 41 U/L -  ALT 14 - 54 U/L -   CBC Latest Ref Rng & Units 06/07/2018  WBC 3.6 - 11.0 K/uL 7.4  Hemoglobin 12.0 - 16.0 g/dL 13.2  Hematocrit 35.0 -  47.0 % 39.9  Platelets 150 - 440 K/uL 258    No images are attached to the encounter.  Mm Breast Surgical Specimen  Result Date: 06/14/2018 CLINICAL DATA:  Post right breast lumpectomy. EXAM: SPECIMEN RADIOGRAPH OF THE RIGHT BREAST COMPARISON:  Previous exam(s). FINDINGS: Status post excision of the right breast. The wire tip and biopsy marker clip are present and are marked for pathology. IMPRESSION: Specimen radiograph of the right breast. Electronically Signed   By: Fidela Salisbury M.D.   On: 06/14/2018 12:35   US Breast Complete Uni Right Inc Axilla  Result Date: 06/30/2018 Ultrasound examination again showed cavity within about 1.2 cm of the skin.  ChloraPrep was applied followed by 10 cc of 0.5% Xylocaine with 0.25% Marcaine with 1 to 200,000 units of epinephrine.  The area was recleansed with ChloraPrep and draped.  An 11-gauge blade was used to make the incision followed by the 8 mm trocar.  The cavity evaluation device was inserted.  As it inflated felt a single suture pop.  Subsequent ultrasound examination showed one area where the balloon came to within 0.68 cm of the skin.  An adequate spacing for safe treatment without skin damage.  Multiple attempts to reposition the balloon were unsuccessful.  The catheter was removed, benzoin and Steri-Strips followed by Telfa and Tegaderm dressing to the catheter site completed.  US Breast Complete Uni Right Inc Axilla  Result Date: 06/22/2018 Ultrasound examination of the wide excision site was undertaken to determine if the patient would be a candidate for accelerated partial breast radiation.  A non-clinically palpable seroma cavity measuring 1.26 x 2.16 x 3.2 cm is noted.  Minimal distance the overlying skin is 1.13 cm.  Potential candidate for accelerated partial breast radiation.  Ultrasound examination of the axilla showed a seroma cavity measuring 2.1 x 2.6 x 2.7 cm.  The patient was amenable to aspiration.  Skin cleansing with  alcohol followed by 1 cc of 1% plain Xylocaine.  A 20-gauge needle was used to decompress the area with approximately 50-70% reduction after 10 cc of clear serous fluid was aspirated.  The procedure was well-tolerated.    Assessment and plan- Patient is a 72 y.o. female with invasive mammary carcinoma of the right breast Stage I A T1bN0M0 ER PR positive her 2 negative grade 2 s/p lumpectomy.  She is here to discuss Oncotype results and further management  Oncotype score came back as low risk with a score of 8.  She therefore does not require any adjuvant chemotherapy at this time.  Patient starts adjuvant radiation therapy in 2 days time which is likely to continue for 5 weeks.  I discussed that given her tumor is ER PR positive there would be a role for hormone therapy.  Her baseline bone density scan is normal.  I would recommend Arimidex 1 mg p.o. daily for 5 years along with calcium 1200 mg and vitamin D 800 international units.Given that her tumor was ER PR positive hormone therapy is indicated at this time.  I discussed the risks and benefits of Arimidex including all but not limited to fatigue, hypercholesterolemia, hot flashes, arthralgias and worsening bone health.  Patient will also need to be on calcium 1200 mg along with vitamin D 800 international units.  Written information about Arimidex given  to the patient. I would like her to finish radiation therapy and start hormone therapy thereafter. Patient verbalized understanding and agrees to proceed  We will tentatively plan to start her on Arimidex after New Year's and I will see her back 6 weeks thereafter with a CMP.  Treatment will be given with a curative intent    Visit Diagnosis 1. Goals of care, counseling/discussion   2. Malignant neoplasm of right female breast, unspecified estrogen receptor status, unspecified site of breast ([place])      Dr. Randa Evens, MD, MPH [facility] [phone] 07/15/2018 8:09 AM

## 2018-07-19 ENCOUNTER — Ambulatory Visit
Admission: RE | Admit: 2018-07-19 | Discharge: 2018-07-19 | Disposition: A | Discharge status: Home / Self Care | Payer: Medicare HMO | Source: Ambulatory Visit | Attending: Radiation Oncology | Admitting: Radiation Oncology

## 2018-07-19 DIAGNOSIS — C50411 Malignant neoplasm of upper-outer quadrant of right female breast: Secondary | ICD-10-CM | POA: Diagnosis not present

## 2018-07-20 ENCOUNTER — Ambulatory Visit: Payer: Medicare HMO

## 2018-07-20 ENCOUNTER — Ambulatory Visit
Admission: RE | Admit: 2018-07-20 | Discharge: 2018-07-20 | Disposition: A | Discharge status: Home / Self Care | Payer: Medicare HMO | Source: Ambulatory Visit | Attending: Radiation Oncology | Admitting: Radiation Oncology

## 2018-07-20 DIAGNOSIS — C50411 Malignant neoplasm of upper-outer quadrant of right female breast: Secondary | ICD-10-CM | POA: Diagnosis not present

## 2018-07-21 ENCOUNTER — Ambulatory Visit: Payer: Medicare HMO

## 2018-07-21 ENCOUNTER — Ambulatory Visit
Admission: RE | Admit: 2018-07-21 | Discharge: 2018-07-21 | Disposition: A | Discharge status: Home / Self Care | Payer: Medicare HMO | Source: Ambulatory Visit | Attending: Radiation Oncology | Admitting: Radiation Oncology

## 2018-07-21 DIAGNOSIS — C50411 Malignant neoplasm of upper-outer quadrant of right female breast: Secondary | ICD-10-CM | POA: Diagnosis not present

## 2018-07-22 ENCOUNTER — Ambulatory Visit: Payer: Medicare HMO

## 2018-07-23 ENCOUNTER — Ambulatory Visit: Payer: Medicare HMO

## 2018-07-23 ENCOUNTER — Ambulatory Visit
Admission: RE | Admit: 2018-07-23 | Discharge: 2018-07-23 | Disposition: A | Discharge status: Home / Self Care | Payer: Medicare HMO | Source: Ambulatory Visit | Attending: Radiation Oncology | Admitting: Radiation Oncology

## 2018-07-23 DIAGNOSIS — C50411 Malignant neoplasm of upper-outer quadrant of right female breast: Secondary | ICD-10-CM | POA: Diagnosis not present

## 2018-07-26 ENCOUNTER — Ambulatory Visit: Payer: Medicare HMO

## 2018-07-26 ENCOUNTER — Ambulatory Visit
Admission: RE | Admit: 2018-07-26 | Discharge: 2018-07-26 | Disposition: A | Discharge status: Home / Self Care | Payer: Medicare HMO | Source: Ambulatory Visit | Attending: Radiation Oncology | Admitting: Radiation Oncology

## 2018-07-26 DIAGNOSIS — C50411 Malignant neoplasm of upper-outer quadrant of right female breast: Secondary | ICD-10-CM | POA: Diagnosis not present

## 2018-07-27 ENCOUNTER — Ambulatory Visit: Payer: Medicare HMO

## 2018-07-27 ENCOUNTER — Ambulatory Visit
Admission: RE | Admit: 2018-07-27 | Discharge: 2018-07-27 | Disposition: A | Discharge status: Home / Self Care | Payer: Medicare HMO | Source: Ambulatory Visit | Attending: Radiation Oncology | Admitting: Radiation Oncology

## 2018-07-27 DIAGNOSIS — C50411 Malignant neoplasm of upper-outer quadrant of right female breast: Secondary | ICD-10-CM | POA: Diagnosis not present

## 2018-07-28 ENCOUNTER — Ambulatory Visit
Admission: RE | Admit: 2018-07-28 | Discharge: 2018-07-28 | Disposition: A | Discharge status: Home / Self Care | Payer: Medicare HMO | Source: Ambulatory Visit | Attending: Radiation Oncology | Admitting: Radiation Oncology

## 2018-07-28 ENCOUNTER — Ambulatory Visit: Payer: Medicare HMO

## 2018-07-28 DIAGNOSIS — C50411 Malignant neoplasm of upper-outer quadrant of right female breast: Secondary | ICD-10-CM | POA: Diagnosis not present

## 2018-07-29 ENCOUNTER — Encounter: Payer: Self-pay | Admitting: General Surgery

## 2018-07-29 ENCOUNTER — Ambulatory Visit: Payer: Medicare HMO

## 2018-07-29 ENCOUNTER — Other Ambulatory Visit: Payer: Self-pay

## 2018-07-29 ENCOUNTER — Ambulatory Visit (INDEPENDENT_AMBULATORY_CARE_PROVIDER_SITE_OTHER): Payer: Medicare HMO | Admitting: General Surgery

## 2018-07-29 ENCOUNTER — Ambulatory Visit
Admission: RE | Admit: 2018-07-29 | Discharge: 2018-07-29 | Disposition: A | Discharge status: Home / Self Care | Payer: Medicare HMO | Source: Ambulatory Visit | Attending: Radiation Oncology | Admitting: Radiation Oncology

## 2018-07-29 VITALS — BP 146/77 | HR 60 | Temp 97.2°F | Resp 18 | Ht 69.0 in | Wt 278.0 lb

## 2018-07-29 DIAGNOSIS — Z170 Estrogen receptor positive status [ER+]: Secondary | ICD-10-CM

## 2018-07-29 DIAGNOSIS — C50411 Malignant neoplasm of upper-outer quadrant of right female breast: Secondary | ICD-10-CM | POA: Diagnosis not present

## 2018-07-29 NOTE — Patient Instructions (Addendum)
Patient is to return to the office in 2 months to see Dr.Byrnett. She needs transportation paperwork completed she will have it mailed to the office for completion.  Call the office with any questions or concerns.

## 2018-07-29 NOTE — Progress Notes (Signed)
Patient ID: [name], female   DOB: [birth year], 72 y.o.   MRN: [id number]  Chief Complaint  Patient presents with  . Follow-up    Right mastectomy     HPI [name] is a 72 y.o. female.  Here today for follow up Right Mastectomy 06/14/18. Throbbing pain intermittently.  She states her breast felt heavy, lumpy a few days ago. Nipple itching, no drainage.No fever or chills. Drinks 4 ensures daily. Decreased appetite.  Symptoms developed since the initiation of radiation therapy. HPI  Past Medical History:  Diagnosis Date  . Abnormality of gait   . Breast cancer ([place]) 02/05/2005   3.5 cm invasive mammary carcinoma, T2, N0,; triple negative, whole breast radiation, cytoxan, taxotere chemotherapy.   . CHF (congestive heart failure) ([place])    March 28, 2017 echo: EF 50-55%; 1) echo 07/2011: EF 20-25%, mild concentric hypertrophy, diffuse HK, regional WMA cannot be excluded, grade 1 DD, mitral valve with mild regurg, LA mildly dilated). 2) EF 40-45%, mild AS,                    . Complication of anesthesia 2006   pt states "hard to wake up" after L breast surgery   . Edema   . GERD (gastroesophageal reflux disease)   . Headache   . Heart murmur   . Hypertension   . Hypokinesis    global, EF 35-45 % from Echo.  . Malignant neoplasm of breast (female), unspecified site 06/14/2018   T1b, N0; ER/PR positive, HER-2 negative.  . Neuropathy   . Obesity   . Pain in joint, site unspecified   . Pneumonia   . Sleep apnea   . Tinea pedis   . Unspecified hearing loss    bilateral    Past Surgical History:  Procedure Laterality Date  . BREAST LUMPECTOMY  2006   left breast   . CATARACT EXTRACTION     left eye  . HERNIA REPAIR    . PARTIAL MASTECTOMY WITH NEEDLE LOCALIZATION Right 06/14/2018   Procedure: PARTIAL MASTECTOMY WITH NEEDLE LOCALIZATION;  Surgeon: Robert Bellow, MD;  Location: [facility] ORS;  Service: General;  Laterality: Right;  . SENTINEL NODE BIOPSY Right 06/14/2018    Procedure: SENTINEL NODE BIOPSY;  Surgeon: Robert Bellow, MD;  Location: [facility] ORS;  Service: General;  Laterality: Right;  . TUBAL LIGATION      Family History  Problem Relation Age of Onset  . Hypertension Mother        deceased 47  . Cataracts Mother   . Breast cancer Other 38       maternal half-sister; deceased 75  . Coronary artery disease Other     Social History Social History   Tobacco Use  . Smoking status: Former Smoker    Packs/day: 0.50    Years: 30.00    Pack years: 15.00    Types: Cigarettes    Last attempt to quit: 07/14/2000    Years since quitting: 18.0  . Smokeless tobacco: Never Used  Substance Use Topics  . Alcohol use: No  . Drug use: Not Currently    Types: Marijuana    Comment: endorses prior marijuana use 1999    No Known Allergies  Current Outpatient Medications  Medication Sig Dispense Refill  . aspirin EC 81 MG tablet Take 81 mg by mouth daily.      . cefadroxil (DURICEF) 500 MG capsule Take 1 capsule (500 mg total) by mouth 2 (two) times daily.  Start this medication one hour before office procedure on 06-30-18 20 capsule 0  . cloNIDine (CATAPRES) 0.2 MG tablet Take 1 tablet (0.2 mg total) by mouth 2 (two) times daily. 60 tablet 11  . clotrimazole (LOTRIMIN) 1 % cream Apply 1 application topically 2 (two) times daily.   1  . Incontinence Supply Disposable (BLADDER CONTROL PADS EX ABSORB) MISC     . Incontinence Supply Disposable (BLADDER CONTROL PADS EX ABSORB) MISC     . lidocaine-prilocaine (EMLA) cream Apply cream to areola one hour prior to procedure and cover with plastic wrap. 30 g 0  . losartan (COZAAR) 25 MG tablet Take 25 mg by mouth daily.     Marland Kitchen omeprazole (PRILOSEC) 20 MG capsule Take 20 mg by mouth daily.    Marland Kitchen oxybutynin (DITROPAN) 5 MG tablet Take 5 mg by mouth 2 (two) times daily.    . potassium chloride SA (K-DUR,KLOR-CON) 20 MEQ tablet Take 1 tablet (20 mEq total) by mouth 2 (two) times daily. 60 tablet 6  . isosorbide  mononitrate (IMDUR) 60 MG 24 hr tablet Take 1 tablet (60 mg total) by mouth 2 (two) times daily. 180 tablet 3  . torsemide (DEMADEX) 20 MG tablet Take 1 tablet (20 mg total) by mouth 2 (two) times daily. 60 tablet 6   No current facility-administered medications for this visit.     Review of Systems Review of Systems  Constitutional: Negative.   Respiratory: Negative.   Skin: Negative.     Blood pressure (!) 146/77, pulse 60, temperature (!) 97.2 F (36.2 C), temperature source Temporal, resp. rate 18, height 5' 9"  (1.753 m), weight 278 lb (126.1 kg), SpO2 96 %.  Physical Exam Physical Exam  Constitutional: She is oriented to person, place, and time. She appears well-developed and well-nourished.  Eyes: Conjunctivae are normal. No scleral icterus.  Neck: Normal range of motion.  Cardiovascular: Normal rate, regular rhythm and normal heart sounds.  Pulmonary/Chest: Effort normal and breath sounds normal. Right breast exhibits no inverted nipple, no mass, no nipple discharge, no skin change and no tenderness. Left breast exhibits no inverted nipple, no mass, no nipple discharge, no skin change and no tenderness.    Lymphadenopathy:    She has no cervical adenopathy.  Neurological: She is alert and oriented to person, place, and time.  Skin: Skin is warm and dry.    Data Reviewed No new data.  Assessment    Doing well post wide excision.  Fair tolerance of radiation therapy.    Plan The patient was reassured that the fatigue she is experiencing will improve after radiation therapy is completed.  Patient is to return to the office in 2 months to see Dr.Leslee Suire. She needs transportation paperwork completed she will have it mailed to the office for completion. HPI, Physical Exam, Assessment and Plan have been scribed under the direction and in the presence of Hervey Ard, Md.  Eudelia Bunch R. Bobette Mo, CMA  HPI, Physical Exam, Assessment and Plan have been scribed under the  direction and in the presence of Robert Bellow, MD. Jonnie Finner, CMA  I have completed the exam and reviewed the above documentation for accuracy and completeness.  I agree with the above.  Haematologist has been used and any errors in dictation or transcription are unintentional.  Hervey Ard, M.D., F.A.C.S.  Forest Gleason Keyaria Lawson 07/29/2018, 9:41 PM

## 2018-07-30 ENCOUNTER — Ambulatory Visit
Admission: RE | Admit: 2018-07-30 | Discharge: 2018-07-30 | Disposition: A | Discharge status: Home / Self Care | Payer: Medicare HMO | Source: Ambulatory Visit | Attending: Radiation Oncology | Admitting: Radiation Oncology

## 2018-07-30 ENCOUNTER — Ambulatory Visit: Payer: Medicare HMO

## 2018-07-30 DIAGNOSIS — C50411 Malignant neoplasm of upper-outer quadrant of right female breast: Secondary | ICD-10-CM | POA: Diagnosis not present

## 2018-08-02 ENCOUNTER — Ambulatory Visit
Admission: RE | Admit: 2018-08-02 | Discharge: 2018-08-02 | Disposition: A | Discharge status: Home / Self Care | Payer: Medicare HMO | Source: Ambulatory Visit | Attending: Radiation Oncology | Admitting: Radiation Oncology

## 2018-08-02 ENCOUNTER — Ambulatory Visit: Payer: Medicare HMO

## 2018-08-02 DIAGNOSIS — C50411 Malignant neoplasm of upper-outer quadrant of right female breast: Secondary | ICD-10-CM | POA: Diagnosis not present

## 2018-08-03 ENCOUNTER — Ambulatory Visit
Admission: RE | Admit: 2018-08-03 | Discharge: 2018-08-03 | Disposition: A | Discharge status: Home / Self Care | Payer: Medicare HMO | Source: Ambulatory Visit | Attending: Radiation Oncology | Admitting: Radiation Oncology

## 2018-08-03 ENCOUNTER — Inpatient Hospital Stay: Payer: Medicare HMO

## 2018-08-03 ENCOUNTER — Ambulatory Visit: Payer: Medicare HMO

## 2018-08-03 DIAGNOSIS — C50411 Malignant neoplasm of upper-outer quadrant of right female breast: Secondary | ICD-10-CM | POA: Diagnosis not present

## 2018-08-04 ENCOUNTER — Ambulatory Visit: Payer: Medicare HMO

## 2018-08-04 ENCOUNTER — Ambulatory Visit
Admission: RE | Admit: 2018-08-04 | Discharge: 2018-08-04 | Disposition: A | Discharge status: Home / Self Care | Payer: Medicare HMO | Source: Ambulatory Visit | Attending: Radiation Oncology | Admitting: Radiation Oncology

## 2018-08-04 DIAGNOSIS — C50411 Malignant neoplasm of upper-outer quadrant of right female breast: Secondary | ICD-10-CM | POA: Diagnosis not present

## 2018-08-09 ENCOUNTER — Ambulatory Visit
Admission: RE | Admit: 2018-08-09 | Discharge: 2018-08-09 | Disposition: A | Discharge status: Home / Self Care | Payer: Medicare HMO | Source: Ambulatory Visit | Attending: Radiation Oncology | Admitting: Radiation Oncology

## 2018-08-09 ENCOUNTER — Ambulatory Visit: Payer: Medicare HMO

## 2018-08-09 DIAGNOSIS — C50411 Malignant neoplasm of upper-outer quadrant of right female breast: Secondary | ICD-10-CM | POA: Insufficient documentation

## 2018-08-09 DIAGNOSIS — Z170 Estrogen receptor positive status [ER+]: Secondary | ICD-10-CM | POA: Diagnosis not present

## 2018-08-10 ENCOUNTER — Ambulatory Visit
Admission: RE | Admit: 2018-08-10 | Discharge: 2018-08-10 | Disposition: A | Discharge status: Home / Self Care | Payer: Medicare HMO | Source: Ambulatory Visit | Attending: Radiation Oncology | Admitting: Radiation Oncology

## 2018-08-10 ENCOUNTER — Ambulatory Visit: Payer: Medicare HMO

## 2018-08-10 ENCOUNTER — Other Ambulatory Visit: Payer: Self-pay | Admitting: *Deleted

## 2018-08-10 DIAGNOSIS — C50411 Malignant neoplasm of upper-outer quadrant of right female breast: Secondary | ICD-10-CM | POA: Diagnosis not present

## 2018-08-11 ENCOUNTER — Other Ambulatory Visit: Payer: Self-pay | Admitting: *Deleted

## 2018-08-11 ENCOUNTER — Ambulatory Visit
Admission: RE | Admit: 2018-08-11 | Discharge: 2018-08-11 | Disposition: A | Discharge status: Home / Self Care | Payer: Medicare HMO | Source: Ambulatory Visit | Attending: Radiation Oncology | Admitting: Radiation Oncology

## 2018-08-11 ENCOUNTER — Ambulatory Visit: Payer: Medicare HMO

## 2018-08-11 DIAGNOSIS — C50411 Malignant neoplasm of upper-outer quadrant of right female breast: Secondary | ICD-10-CM | POA: Diagnosis not present

## 2018-08-11 MED ORDER — ANASTROZOLE 1 MG PO TABS: 1.0000 mg | ORAL_TABLET | Freq: Every day | ORAL | 1 refills | Status: DC

## 2018-08-12 ENCOUNTER — Ambulatory Visit
Admission: RE | Admit: 2018-08-12 | Discharge: 2018-08-12 | Disposition: A | Discharge status: Home / Self Care | Payer: Medicare HMO | Source: Ambulatory Visit | Attending: Radiation Oncology | Admitting: Radiation Oncology

## 2018-08-12 ENCOUNTER — Ambulatory Visit: Payer: Medicare HMO

## 2018-08-12 DIAGNOSIS — C50411 Malignant neoplasm of upper-outer quadrant of right female breast: Secondary | ICD-10-CM | POA: Diagnosis not present

## 2018-08-13 ENCOUNTER — Ambulatory Visit: Payer: Medicare HMO

## 2018-08-16 ENCOUNTER — Ambulatory Visit: Payer: Medicare HMO

## 2018-08-17 ENCOUNTER — Other Ambulatory Visit: Payer: Self-pay | Admitting: *Deleted

## 2018-08-17 ENCOUNTER — Ambulatory Visit: Payer: Medicare HMO

## 2018-08-17 ENCOUNTER — Inpatient Hospital Stay: Payer: Medicare HMO

## 2018-08-17 MED ORDER — SILVER SULFADIAZINE 1 % EX CREA: 1.0000 "application " | TOPICAL_CREAM | Freq: Two times a day (BID) | CUTANEOUS | 0 refills | Status: DC

## 2018-08-18 ENCOUNTER — Ambulatory Visit: Payer: Medicare HMO

## 2018-08-19 ENCOUNTER — Ambulatory Visit: Payer: Medicare HMO

## 2018-08-20 ENCOUNTER — Telehealth: Payer: Self-pay | Admitting: *Deleted

## 2018-08-20 ENCOUNTER — Other Ambulatory Visit: Payer: Self-pay | Admitting: *Deleted

## 2018-08-20 ENCOUNTER — Ambulatory Visit: Payer: Medicare HMO

## 2018-08-20 MED ORDER — CALCIUM 600+D PLUS MINERALS 600-400 MG-UNIT PO CHEW: 1.0000 | CHEWABLE_TABLET | Freq: Two times a day (BID) | ORAL | 6 refills | Status: DC

## 2018-08-20 NOTE — Telephone Encounter (Signed)
-----   Message from Daiva Huge, RN sent at 08/20/2018  1:58 PM EST ----- Regarding: Question [name] called wanting to know what vitamins she's supposed to take.  "She got her cancer pill, but doesn't know what vitamins".   I wasn't sure, I assumed calcium but told her you'd give her a call back.

## 2018-08-20 NOTE — Telephone Encounter (Signed)
Called patient on her cell phone and left her a message that the vitamin she needs to take his calcium 600 mg plus vitamin D 400 IU which is international units.  She can find them on the vitamin aisle in any store and it is a combination pill that includes calcium with vitamin D.  And what she gets those pills she would take 1 tablet twice a day.  And also left my phone number in case she had questions or concerns or needs more information

## 2018-08-23 ENCOUNTER — Ambulatory Visit: Payer: Medicare HMO

## 2018-08-24 ENCOUNTER — Ambulatory Visit: Payer: Medicare HMO

## 2018-08-25 ENCOUNTER — Ambulatory Visit: Payer: Medicare HMO

## 2018-08-26 ENCOUNTER — Ambulatory Visit: Payer: Medicare HMO

## 2018-08-27 ENCOUNTER — Ambulatory Visit: Payer: Medicare HMO

## 2018-08-30 ENCOUNTER — Ambulatory Visit: Payer: Medicare HMO

## 2018-08-31 ENCOUNTER — Ambulatory Visit: Payer: Medicare HMO

## 2018-09-02 ENCOUNTER — Ambulatory Visit: Payer: Medicare HMO

## 2018-09-03 ENCOUNTER — Ambulatory Visit: Payer: Medicare HMO

## 2018-09-05 ENCOUNTER — Other Ambulatory Visit: Payer: Self-pay | Admitting: Oncology

## 2018-09-06 ENCOUNTER — Ambulatory Visit: Payer: Medicare HMO

## 2018-09-07 ENCOUNTER — Ambulatory Visit: Payer: Medicare HMO

## 2018-09-09 ENCOUNTER — Ambulatory Visit: Payer: Medicare HMO

## 2018-09-10 ENCOUNTER — Ambulatory Visit: Payer: Medicare HMO

## 2018-09-15 ENCOUNTER — Other Ambulatory Visit: Payer: Self-pay

## 2018-09-15 ENCOUNTER — Encounter: Payer: Self-pay | Admitting: Radiation Oncology

## 2018-09-15 ENCOUNTER — Ambulatory Visit
Admission: RE | Admit: 2018-09-15 | Discharge: 2018-09-15 | Disposition: A | Discharge status: Home / Self Care | Payer: Medicare HMO | Source: Ambulatory Visit | Attending: Radiation Oncology | Admitting: Radiation Oncology

## 2018-09-15 DIAGNOSIS — Z853 Personal history of malignant neoplasm of breast: Secondary | ICD-10-CM | POA: Diagnosis not present

## 2018-09-15 DIAGNOSIS — Z923 Personal history of irradiation: Secondary | ICD-10-CM | POA: Insufficient documentation

## 2018-09-15 DIAGNOSIS — C50911 Malignant neoplasm of unspecified site of right female breast: Secondary | ICD-10-CM | POA: Diagnosis present

## 2018-09-15 DIAGNOSIS — C50411 Malignant neoplasm of upper-outer quadrant of right female breast: Secondary | ICD-10-CM

## 2018-09-15 DIAGNOSIS — Z170 Estrogen receptor positive status [ER+]: Secondary | ICD-10-CM | POA: Diagnosis not present

## 2018-09-15 DIAGNOSIS — Z79811 Long term (current) use of aromatase inhibitors: Secondary | ICD-10-CM | POA: Diagnosis not present

## 2018-09-15 NOTE — Progress Notes (Signed)
Radiation Oncology Follow up Note  Name: [name]   Date:   09/15/2018 MRN:  [id number] DOB: [birth year]    This 73 y.o. 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  REFERRING PROVIDER: Donnie Coffin, MD  HPI: patient is a 73 year old female now out 1 month having completed hypofractionated course of radiation therapy to her right breast for stage I ER/PR positive invasive mammary carcinoma. We tried MammoSite balloon placement although that cannot perform performed she underwent hypofractionated course of treatment. She also has a prior history of radiation therapy to her left breast again for stage I carcinoma seen today in routine follow-up she is doing well. She specifically denies breast tenderness cough or bone pain..she's currently on arimadex tolerate that well without side effect.  COMPLICATIONS OF TREATMENT: none  FOLLOW UP COMPLIANCE: keeps appointments   PHYSICAL EXAM:  There were no vitals taken for this visit. Lungs are clear to A&P cardiac examination essentially unremarkable with regular rate and rhythm. No dominant mass or nodularity is noted in either breast in 2 positions examined. Incision is well-healed. No axillary or supraclavicular adenopathy is appreciated. Cosmetic result is excellent.still some slight hyperpigmentation of the skin no areas skin breakdown noted. Well-developed well-nourished patient in NAD. HEENT reveals PERLA, EOMI, discs not visualized.  Oral cavity is clear. No oral mucosal lesions are identified. Neck is clear without evidence of cervical or supraclavicular adenopathy. Lungs are clear to A&P. Cardiac examination is essentially unremarkable with regular rate and rhythm without murmur rub or thrill. Abdomen is benign with no organomegaly or masses noted. Motor sensory and DTR  levels are equal and symmetric in the upper and lower extremities. Cranial nerves II through XII are grossly intact. Proprioception is intact. No peripheral adenopathy or edema is identified. No motor or sensory levels are noted. Crude visual fields are within normal range. RADIOLOGY RESULTS: no current films for review  PLAN: at the present time patient is doing well 1 month out from whole breast radiation to her right breast. I'm please were overall progress. I've asked to see her back in 4-5 months for follow-up. Patient is to call sooner with any concerns. She continues on arimadex without side effect.  I would like to take this opportunity to thank you for allowing me to participate in the care of your patient.[name], MD

## 2018-09-20 ENCOUNTER — Telehealth: Payer: Self-pay | Admitting: General Surgery

## 2018-09-20 NOTE — Telephone Encounter (Signed)
Spoke with the patients letting her know we would need to r/s her appointment with Dr. Bary Castilla on 09/28/2018. Patient stated she did not want to see another provider and would like to see Dr. Bary Castilla I did let the patient know we were not sure of a return date as of right now and would call her back to let her know.

## 2018-09-28 ENCOUNTER — Ambulatory Visit: Payer: Medicare HMO | Admitting: General Surgery

## 2018-10-05 ENCOUNTER — Ambulatory Visit (INDEPENDENT_AMBULATORY_CARE_PROVIDER_SITE_OTHER): Payer: Medicare HMO | Admitting: General Surgery

## 2018-10-05 ENCOUNTER — Other Ambulatory Visit: Payer: Self-pay

## 2018-10-05 ENCOUNTER — Encounter: Payer: Self-pay | Admitting: General Surgery

## 2018-10-05 VITALS — BP 165/84 | HR 72 | Temp 97.7°F | Ht 69.0 in | Wt 283.6 lb

## 2018-10-05 DIAGNOSIS — C50411 Malignant neoplasm of upper-outer quadrant of right female breast: Secondary | ICD-10-CM

## 2018-10-05 DIAGNOSIS — Z170 Estrogen receptor positive status [ER+]: Secondary | ICD-10-CM | POA: Diagnosis not present

## 2018-10-05 NOTE — Patient Instructions (Addendum)
The patient is aware to call back for any questions or concerns.  May try Claritin or Benadryl at bedtime for the itching  May use Viactiv Calcium Chews instead of tablet    Continue the Arimidex.  Follow up in April

## 2018-10-05 NOTE — Progress Notes (Signed)
Patient ID: [name], female   DOB: [birth year], 73 y.o.   MRN: [id number]  Chief Complaint  Patient presents with  . Follow-up    ne month f/u breast ca no mammos    HPI [name] is a 73 y.o. female.  who presents for her right breast cancer follow up and a breast evaluation.   No new breast issues. She states the right breast itches, occasionally, and she uses A & D ointment to the area. She completed whole breast radiation in December. Tolerating the Arimidex.   HPI  Past Medical History:  Diagnosis Date  . Abnormality of gait   . Breast cancer ([place]) 02/05/2005   3.5 cm invasive mammary carcinoma, T2, N0,; triple negative, whole breast radiation, cytoxan, taxotere chemotherapy.   . CHF (congestive heart failure) ([place])    March 28, 2017 echo: EF 50-55%; 1) echo 07/2011: EF 20-25%, mild concentric hypertrophy, diffuse HK, regional WMA cannot be excluded, grade 1 DD, mitral valve with mild regurg, LA mildly dilated). 2) EF 40-45%, mild AS,                    . Complication of anesthesia 2006   pt states "hard to wake up" after L breast surgery   . Edema   . GERD (gastroesophageal reflux disease)   . Headache   . Heart murmur   . Hypertension   . Hypokinesis    global, EF 35-45 % from Echo.  . Malignant neoplasm of breast (female), unspecified site 06/14/2018   T1b, N0; ER/PR positive, HER-2 negative.  . Neuropathy   . Obesity   . Pain in joint, site unspecified   . Pneumonia   . Sleep apnea   . Tinea pedis   . Unspecified hearing loss    bilateral    Past Surgical History:  Procedure Laterality Date  . BREAST LUMPECTOMY  2006   left breast   . CATARACT EXTRACTION     left eye  . HERNIA REPAIR    . PARTIAL MASTECTOMY WITH NEEDLE LOCALIZATION Right 06/14/2018   Procedure: PARTIAL MASTECTOMY WITH NEEDLE LOCALIZATION;  Surgeon: Robert Bellow, MD;  Location: [facility] ORS;  Service: General;  Laterality: Right;  . SENTINEL NODE BIOPSY Right 06/14/2018    Procedure: SENTINEL NODE BIOPSY;  Surgeon: Robert Bellow, MD;  Location: [facility] ORS;  Service: General;  Laterality: Right;  . TUBAL LIGATION      Family History  Problem Relation Age of Onset  . Hypertension Mother        deceased 32  . Cataracts Mother   . Breast cancer Other 32       maternal half-sister; deceased 65  . Coronary artery disease Other     Social History Social History   Tobacco Use  . Smoking status: Former Smoker    Packs/day: 0.50    Years: 30.00    Pack years: 15.00    Types: Cigarettes    Last attempt to quit: 07/14/2000    Years since quitting: 18.2  . Smokeless tobacco: Never Used  Substance Use Topics  . Alcohol use: No  . Drug use: Not Currently    Types: Marijuana    Comment: endorses prior marijuana use 1999    No Known Allergies  Current Outpatient Medications  Medication Sig Dispense Refill  . anastrozole (ARIMIDEX) 1 MG tablet TAKE 1 TABLET BY MOUTH EVERY DAY 30 tablet 1  . aspirin EC 81 MG tablet Take 81 mg by mouth  daily.      . Calcium Carbonate-Vit D-Min (CALCIUM 600+D PLUS MINERALS) 600-400 MG-UNIT CHEW Chew 1 tablet by mouth 2 (two) times daily. 60 each 6  . cloNIDine (CATAPRES) 0.2 MG tablet Take 1 tablet (0.2 mg total) by mouth 2 (two) times daily. 60 tablet 11  . clotrimazole (LOTRIMIN) 1 % cream Apply 1 application topically 2 (two) times daily.   1  . Incontinence Supply Disposable (BLADDER CONTROL PADS EX ABSORB) MISC     . Incontinence Supply Disposable (BLADDER CONTROL PADS EX ABSORB) MISC     . losartan (COZAAR) 25 MG tablet Take 25 mg by mouth daily.     Marland Kitchen omeprazole (PRILOSEC) 20 MG capsule Take 20 mg by mouth daily.    Marland Kitchen oxybutynin (DITROPAN) 5 MG tablet Take 5 mg by mouth 2 (two) times daily.    . potassium chloride SA (K-DUR,KLOR-CON) 20 MEQ tablet Take 1 tablet (20 mEq total) by mouth 2 (two) times daily. 60 tablet 6  . silver sulfADIAZINE (SILVADENE) 1 % cream Apply 1 application topically 2 (two) times daily. 50 g  0  . isosorbide mononitrate (IMDUR) 60 MG 24 hr tablet Take 1 tablet (60 mg total) by mouth 2 (two) times daily. 180 tablet 3  . lidocaine-prilocaine (EMLA) cream Apply cream to areola one hour prior to procedure and cover with plastic wrap. (Patient not taking: Reported on 10/05/2018) 30 g 0  . torsemide (DEMADEX) 20 MG tablet Take 1 tablet (20 mg total) by mouth 2 (two) times daily. 60 tablet 6   No current facility-administered medications for this visit.     Review of Systems Review of Systems  Constitutional: Negative.   Respiratory: Positive for cough.   Cardiovascular: Negative.     Blood pressure (!) 165/84, pulse 72, temperature 97.7 F (36.5 C), temperature source Temporal, height 5' 9" (1.753 m), weight 283 lb 9.6 oz (128.6 kg), SpO2 90 %.  Physical Exam Physical Exam Chest:       Data Reviewed September 15, 2018 radiation oncology notes reviewed.  Assessment    Doing well status post breast conservation surgery for a T1b N0 carcinoma.  ER/PR positive, HER-2 negative.  Presently being managed with a REM index by medical oncology.    Local skin discomfort secondary to radiation.  Plan    Follow-up examination monthly.  The patient was encouraged to make use of a trial of OTC Claritin or Benadryl for itching at night in the radiation field.  Potential sedating effects of Benadryl reviewed.  The patient was convinced that calcium supplements were causing foot swelling.  Unlikely, but she has been asked to make use of a trial of Viactiv calcium and vitamin D supplements to see if these are better tolerated.       HPI, Physical Exam, Assessment and Plan have been scribed under the direction and in the presence of Robert Bellow, MD. Karie Fetch, RN  I have completed the exam and reviewed the above documentation for accuracy and completeness.  I agree with the above.  Haematologist has been used and any errors in dictation or transcription are  unintentional.  Hervey Ard, M.D., F.A.C.S.   [name] [name] 10/05/2018, 2:23 PM

## 2018-10-17 IMAGING — MG MM DIGITAL SCREENING BILAT W/ TOMO W/ CAD
8 of 17 series · 8 of 37 positions shown · non-contrast
Comparison: Previous exam(s).

ACR Breast Density Category a: The breast tissue is almost entirely
fatty.

CLINICAL DATA: Screening.

EXAM:
2D DIGITAL SCREENING BILATERAL MAMMOGRAM WITH CAD AND ADJUNCT TOMO

[R CC (1 of 2)]
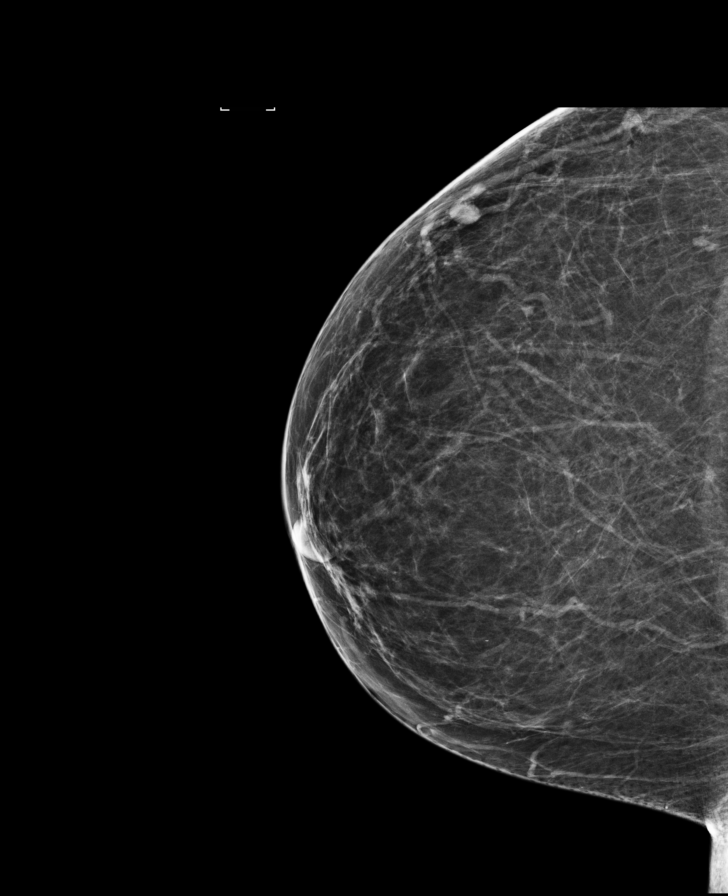

[R MLO (1 of 2)]
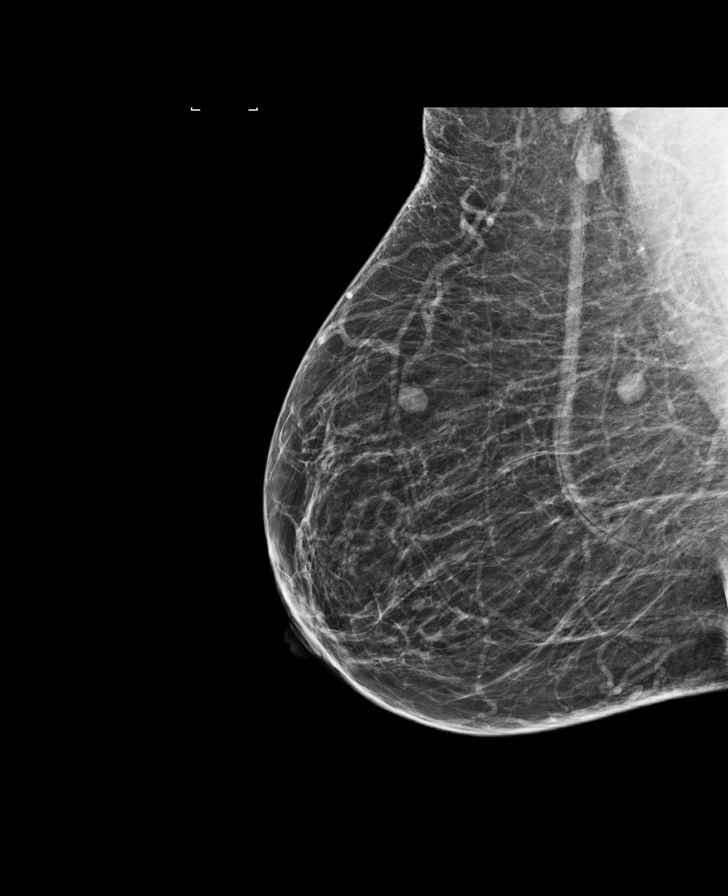

[L MLO (1 of 2)]
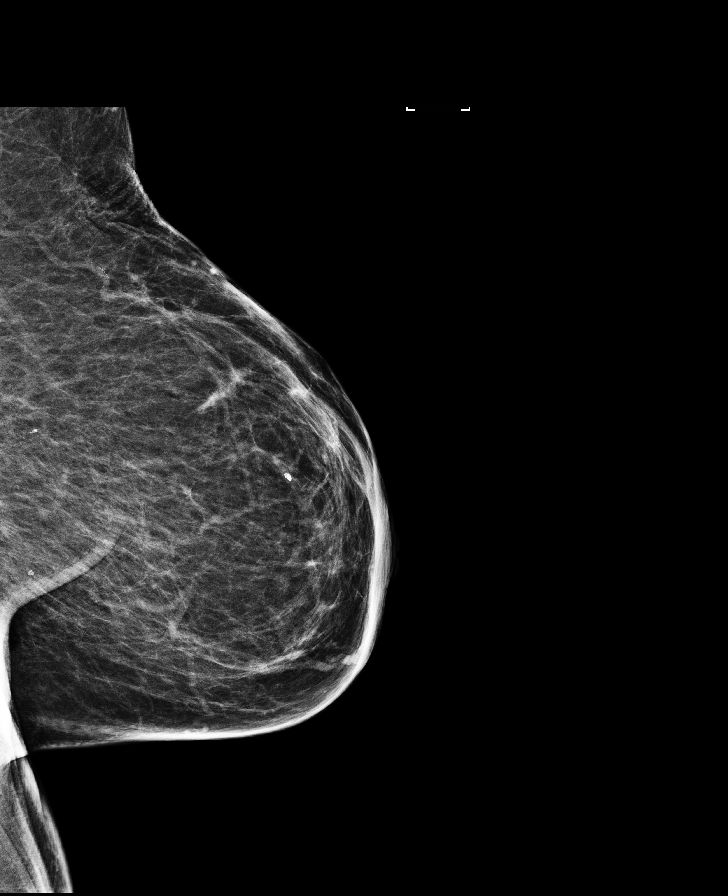

[R MLO (2 of 2)]
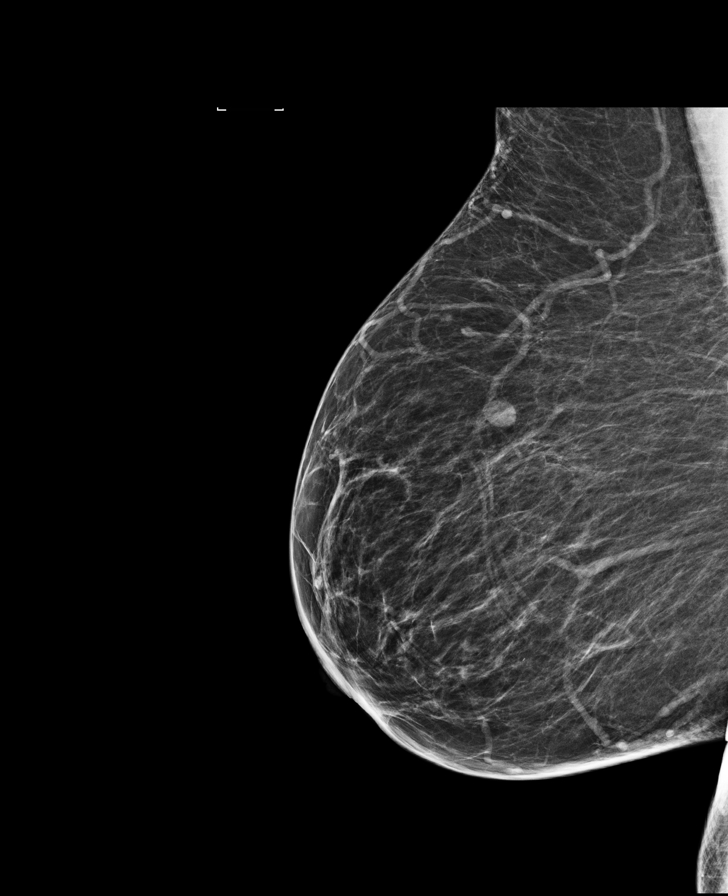

[L CC]
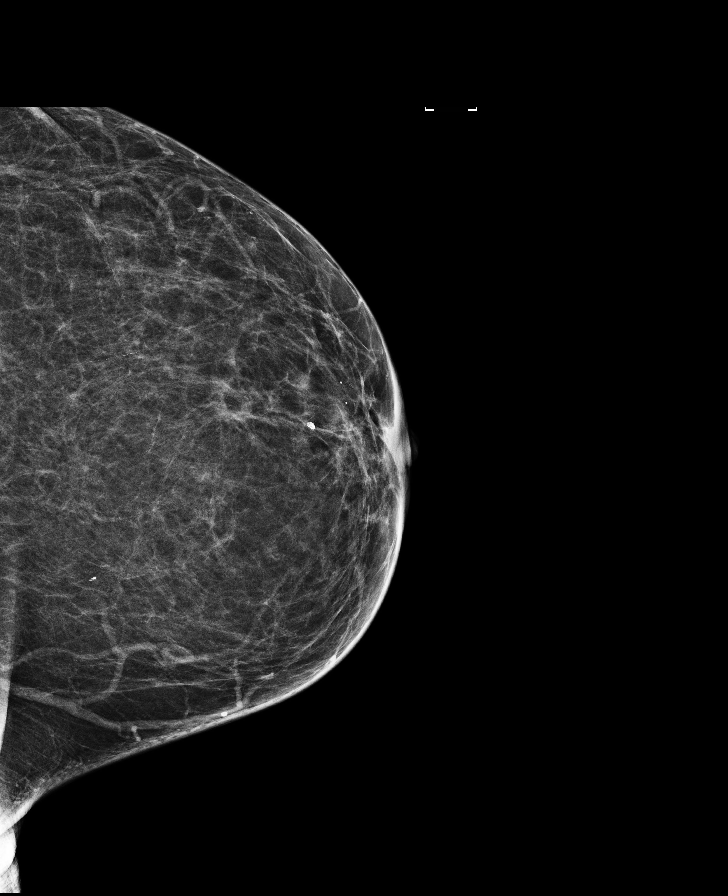

[R CC synth-2D]
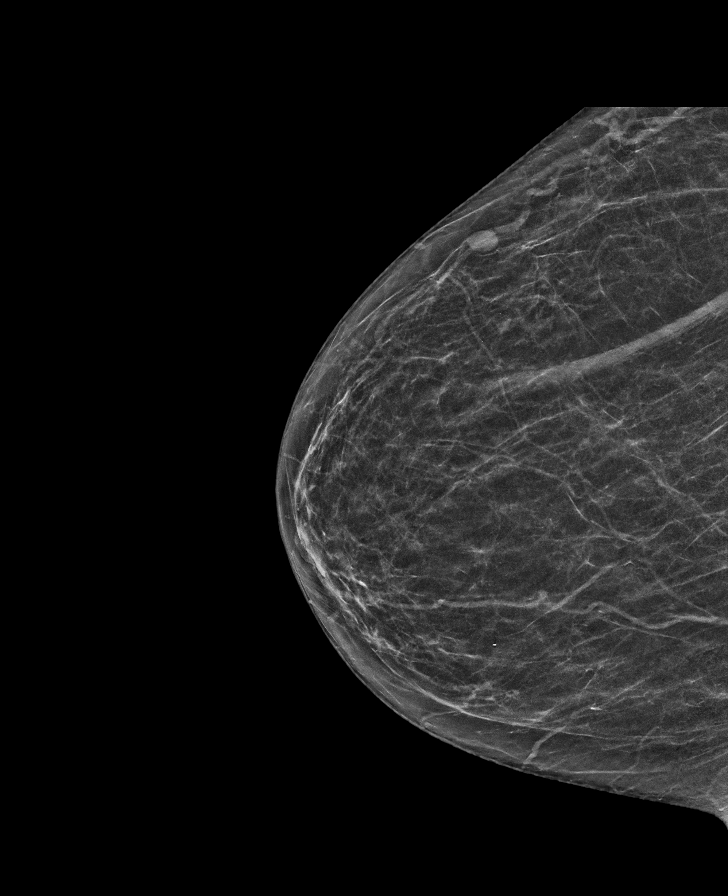

[L MLO (2 of 2)]
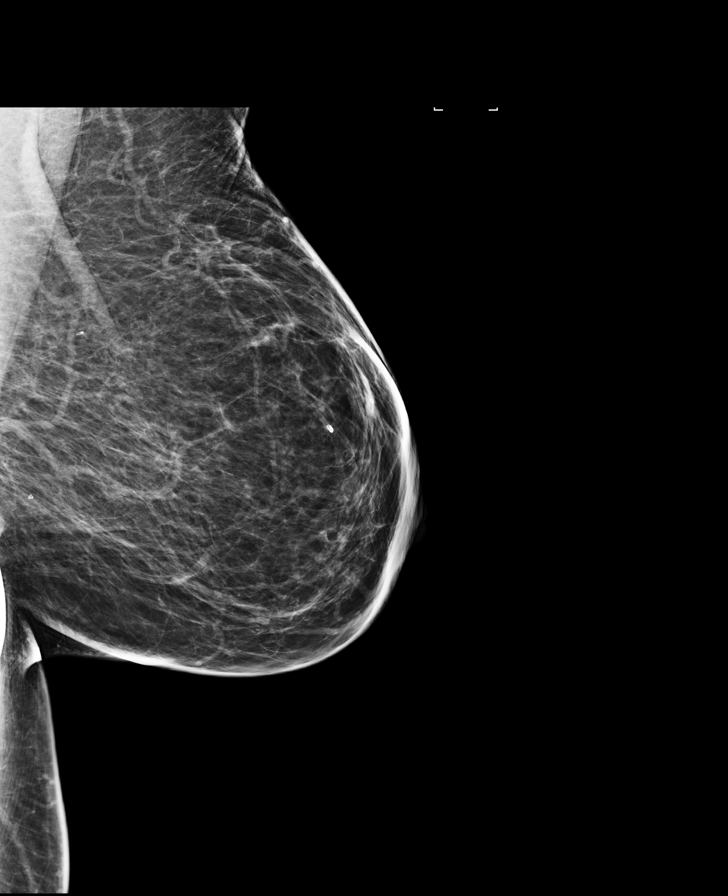

[R CC (2 of 2)]
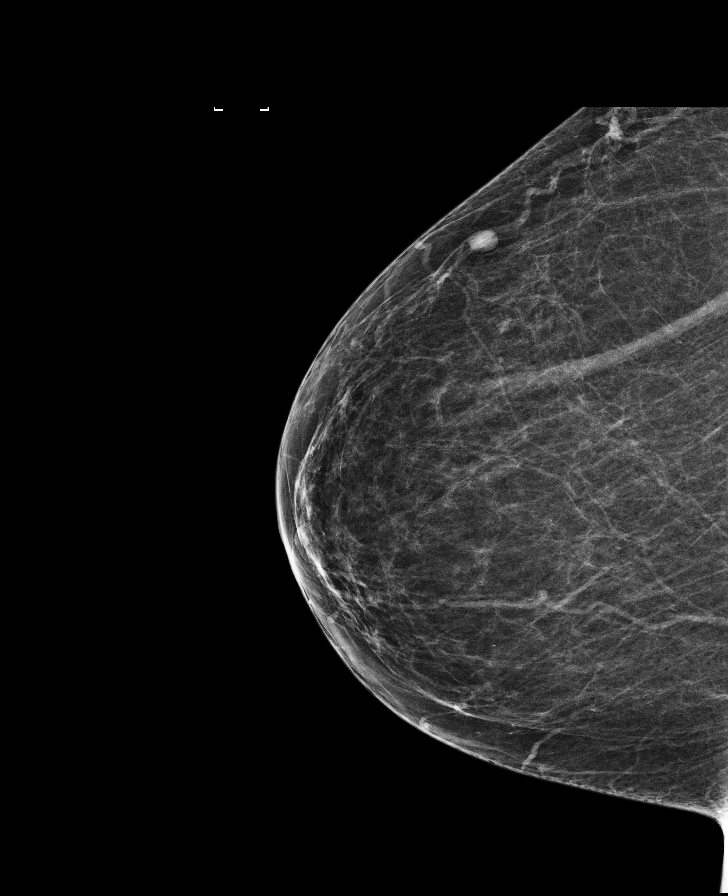

[8 of 37 positions shown; findings below may reference images not displayed]

FINDINGS: There are no findings suspicious for malignancy. Images were
processed with CAD.
IMPRESSION: No mammographic evidence of malignancy. A result letter of this
screening mammogram will be mailed directly to the patient.

RECOMMENDATION:
Screening mammogram in one year. (Code:1B-X-8P0)

BI-RADS CATEGORY  1: Negative.

## 2018-10-25 ENCOUNTER — Other Ambulatory Visit: Payer: Self-pay

## 2018-10-25 DIAGNOSIS — C50911 Malignant neoplasm of unspecified site of right female breast: Secondary | ICD-10-CM

## 2018-10-26 ENCOUNTER — Inpatient Hospital Stay (HOSPITAL_BASED_OUTPATIENT_CLINIC_OR_DEPARTMENT_OTHER): Payer: Medicare HMO | Admitting: Oncology

## 2018-10-26 ENCOUNTER — Inpatient Hospital Stay: Payer: Medicare HMO | Attending: Oncology

## 2018-10-26 VITALS — BP 138/83 | HR 61 | Resp 18 | Wt 278.1 lb

## 2018-10-26 DIAGNOSIS — I10 Essential (primary) hypertension: Secondary | ICD-10-CM

## 2018-10-26 DIAGNOSIS — C50911 Malignant neoplasm of unspecified site of right female breast: Secondary | ICD-10-CM | POA: Insufficient documentation

## 2018-10-26 DIAGNOSIS — C50411 Malignant neoplasm of upper-outer quadrant of right female breast: Secondary | ICD-10-CM

## 2018-10-26 DIAGNOSIS — Z923 Personal history of irradiation: Secondary | ICD-10-CM

## 2018-10-26 DIAGNOSIS — Z9221 Personal history of antineoplastic chemotherapy: Secondary | ICD-10-CM

## 2018-10-26 DIAGNOSIS — Z79899 Other long term (current) drug therapy: Secondary | ICD-10-CM

## 2018-10-26 DIAGNOSIS — Z79811 Long term (current) use of aromatase inhibitors: Secondary | ICD-10-CM | POA: Diagnosis not present

## 2018-10-26 DIAGNOSIS — Z87891 Personal history of nicotine dependence: Secondary | ICD-10-CM | POA: Diagnosis not present

## 2018-10-26 DIAGNOSIS — Z5181 Encounter for therapeutic drug level monitoring: Secondary | ICD-10-CM

## 2018-10-26 DIAGNOSIS — Z853 Personal history of malignant neoplasm of breast: Secondary | ICD-10-CM

## 2018-10-26 DIAGNOSIS — Z170 Estrogen receptor positive status [ER+]: Secondary | ICD-10-CM | POA: Diagnosis not present

## 2018-10-26 DIAGNOSIS — Z08 Encounter for follow-up examination after completed treatment for malignant neoplasm: Secondary | ICD-10-CM

## 2018-10-26 DIAGNOSIS — Z7982 Long term (current) use of aspirin: Secondary | ICD-10-CM | POA: Insufficient documentation

## 2018-10-26 LAB — COMPREHENSIVE METABOLIC PANEL
ALT: 14 U/L (ref 0–44)
AST: 18 U/L (ref 15–41)
Albumin: 3.7 g/dL (ref 3.5–5.0)
Alkaline Phosphatase: 58 U/L (ref 38–126)
Anion gap: 10 (ref 5–15)
BILIRUBIN TOTAL: 0.5 mg/dL (ref 0.3–1.2)
BUN: 25 mg/dL — AB (ref 8–23)
CO2: 29 mmol/L (ref 22–32)
Calcium: 9.1 mg/dL (ref 8.9–10.3)
Chloride: 101 mmol/L (ref 98–111)
Creatinine, Ser: 1 mg/dL (ref 0.44–1.00)
GFR, EST NON AFRICAN AMERICAN: 56 mL/min — AB (ref >60)
Glucose, Bld: 98 mg/dL (ref 70–99)
Potassium: 4.5 mmol/L (ref 3.5–5.1)
Sodium: 140 mmol/L (ref 135–145)
TOTAL PROTEIN: 8.3 g/dL — AB (ref 6.5–8.1)

## 2018-10-26 NOTE — Progress Notes (Signed)
Patient states she has a headache all the time.  Appetite is decreased.  Patient states she eats one meal a day.  Drinks Ensure.

## 2018-10-27 ENCOUNTER — Encounter: Payer: Self-pay | Admitting: Oncology

## 2018-10-27 NOTE — Progress Notes (Signed)
Hematology/Oncology Consult note [facility]  [phone] [phone]  Patient Care Team: Donnie Coffin, MD as PCP - General (Family Medicine) Minna Merritts, MD as Consulting Physician (Cardiology)   Name of the patient: [name]  [id number]  [birth year]   Date of visit: 10/27/18  Diagnosis- stage I AcT1c cN0 cMX invasive mammary carcinoma, grade 1 ER PR positive and HER-2/neu negative.  Chief complaint/ Reason for visit-follow-up of breast cancer on Arimidex  Heme/Onc history: : patient is a 73 year old female with a prior history of left breast cancer in 2006. 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 She has been in remission since then. She has been getting periodic mammograms and the most recent mammogram and ultrasound in August 2019 showed 0.9 x 0.9 x 0.8 cm irregular spiculated mass in the 11 o'clock position of the right breast. There was a concern for an abnormal intramammary lymph node as well. No other abnormal axillary adenopathy noted. Patient underwent ultrasound-guided biopsy of both the right breast mass as well as the axillary lymph node lymph node biopsy was negative for malignancy. Biopsy of the right breast mass showed 6 mm, grade 1 ER PR positive and HER-2/neu negative tumor.  Patient is morbidly obese and has difficulty with ambulation. She uses a cane or a walker to ambulate around the house. She sees Dr. Rockey Situ for her cardiology issues. Reports that her appetite is good and she denies any unintentional weight loss or any new aches or pains anywhere. Family history significant for breast cancer in her sister who died from breast cancer in her 54s. genetic testing was negative  Final pathology showed 7 mm grade 2 invasive mammary carcinoma ER PR positive her 2 negative. Negative nodes. Clear margins  Oncotype score came as low risk 8 and  she does not require adjuvant chemotherapy She completed adjuvant radiation and started Arimidex in December 2019  Interval history-tolerating Arimidex well without any significant hot flashes.  She has chronic back pain even before starting Arimidex her appetite is good and she denies any unintentional weight loss  ECOG PS- 2 Pain scale- 7  Review of systems- Review of Systems  Constitutional: Positive for malaise/fatigue. Negative for chills, fever and weight loss.  HENT: Negative for congestion, ear discharge and nosebleeds.   Eyes: Negative for blurred vision.  Respiratory: Negative for cough, hemoptysis, sputum production, shortness of breath and wheezing.   Cardiovascular: Negative for chest pain, palpitations, orthopnea and claudication.  Gastrointestinal: Negative for abdominal pain, blood in stool, constipation, diarrhea, heartburn, melena, nausea and vomiting.  Genitourinary: Negative for dysuria, flank pain, frequency, hematuria and urgency.  Musculoskeletal: Positive for back pain and joint pain. Negative for myalgias.  Skin: Negative for rash.  Neurological: Negative for dizziness, tingling, focal weakness, seizures, weakness and headaches.  Endo/Heme/Allergies: Does not bruise/bleed easily.  Psychiatric/Behavioral: Negative for depression and suicidal ideas. The patient does not have insomnia.      No Known Allergies   Past Medical History:  Diagnosis Date  . Abnormality of gait   . Breast cancer ([place]) 02/05/2005   3.5 cm invasive mammary carcinoma, T2, N0,; triple negative, whole breast radiation, cytoxan, taxotere chemotherapy.   . CHF (congestive heart failure) ([place])    March 28, 2017 echo: EF 50-55%; 1) echo 07/2011: EF 20-25%, mild concentric hypertrophy, diffuse HK, regional WMA cannot be excluded, grade 1 DD, mitral valve with mild regurg, LA mildly dilated). 2)  EF 40-45%, mild AS,                    . Complication of anesthesia 2006   pt states "hard to wake up"  after L breast surgery   . Edema   . GERD (gastroesophageal reflux disease)   . Headache   . Heart murmur   . Hypertension   . Hypokinesis    global, EF 35-45 % from Echo.  . Malignant neoplasm of breast (female), unspecified site 06/14/2018   T1b, N0; ER/PR positive, HER-2 negative.  . Neuropathy   . Obesity   . Pain in joint, site unspecified   . Pneumonia   . Sleep apnea   . Tinea pedis   . Unspecified hearing loss    bilateral     Past Surgical History:  Procedure Laterality Date  . BREAST LUMPECTOMY  2006   left breast   . CATARACT EXTRACTION     left eye  . HERNIA REPAIR    . PARTIAL MASTECTOMY WITH NEEDLE LOCALIZATION Right 06/14/2018   Procedure: PARTIAL MASTECTOMY WITH NEEDLE LOCALIZATION;  Surgeon: Robert Bellow, MD;  Location: [facility] ORS;  Service: General;  Laterality: Right;  . SENTINEL NODE BIOPSY Right 06/14/2018   Procedure: SENTINEL NODE BIOPSY;  Surgeon: Robert Bellow, MD;  Location: [facility] ORS;  Service: General;  Laterality: Right;  . TUBAL LIGATION      Social History   Socioeconomic History  . Marital status: Divorced    Spouse name: Not on file  . Number of children: Not on file  . Years of education: Not on file  . Highest education level: Not on file  Occupational History  . Occupation: retired  Scientific laboratory technician  . Financial resource strain: Not on file  . Food insecurity:    Worry: Not on file    Inability: Not on file  . Transportation needs:    Medical: Not on file    Non-medical: Not on file  Tobacco Use  . Smoking status: Former Smoker    Packs/day: 0.50    Years: 30.00    Pack years: 15.00    Types: Cigarettes    Last attempt to quit: 07/14/2000    Years since quitting: 18.2  . Smokeless tobacco: Never Used  Substance and Sexual Activity  . Alcohol use: No  . Drug use: Not Currently    Types: Marijuana    Comment: endorses prior marijuana use 1999  . Sexual activity: Never  Lifestyle  . Physical activity:    Days per  week: Not on file    Minutes per session: Not on file  . Stress: Not on file  Relationships  . Social connections:    Talks on phone: Not on file    Gets together: Not on file    Attends religious service: Not on file    Active member of club or organization: Not on file    Attends meetings of clubs or organizations: Not on file    Relationship status: Not on file  . Intimate partner violence:    Fear of current or ex partner: Not on file    Emotionally abused: Not on file    Physically abused: Not on file    Forced sexual activity: Not on file  Other Topics Concern  . Not on file  Social History Narrative  . Not on file    Family History  Problem Relation Age of Onset  . Hypertension Mother  deceased 73  . Cataracts Mother   . Breast cancer Other 57       maternal half-sister; deceased 3  . Coronary artery disease Other      Current Outpatient Medications:  .  anastrozole (ARIMIDEX) 1 MG tablet, TAKE 1 TABLET BY MOUTH EVERY DAY, Disp: 30 tablet, Rfl: 1 .  aspirin EC 81 MG tablet, Take 81 mg by mouth daily.  , Disp: , Rfl:  .  Calcium Carbonate-Vit D-Min (CALCIUM 600+D PLUS MINERALS) 600-400 MG-UNIT CHEW, Chew 1 tablet by mouth 2 (two) times daily., Disp: 60 each, Rfl: 6 .  cloNIDine (CATAPRES) 0.2 MG tablet, Take 1 tablet (0.2 mg total) by mouth 2 (two) times daily., Disp: 60 tablet, Rfl: 11 .  clotrimazole (LOTRIMIN) 1 % cream, Apply 1 application topically 2 (two) times daily. , Disp: , Rfl: 1 .  Incontinence Supply Disposable (BLADDER CONTROL PADS EX ABSORB) MISC, , Disp: , Rfl:  .  Incontinence Supply Disposable (BLADDER CONTROL PADS EX ABSORB) MISC, , Disp: , Rfl:  .  isosorbide mononitrate (IMDUR) 60 MG 24 hr tablet, Take 1 tablet (60 mg total) by mouth 2 (two) times daily., Disp: 180 tablet, Rfl: 3 .  lidocaine-prilocaine (EMLA) cream, Apply cream to areola one hour prior to procedure and cover with plastic wrap., Disp: 30 g, Rfl: 0 .  losartan (COZAAR) 25 MG  tablet, Take 25 mg by mouth daily. , Disp: , Rfl:  .  omeprazole (PRILOSEC) 20 MG capsule, Take 20 mg by mouth daily., Disp: , Rfl:  .  oxybutynin (DITROPAN) 5 MG tablet, Take 5 mg by mouth 2 (two) times daily., Disp: , Rfl:  .  potassium chloride SA (K-DUR,KLOR-CON) 20 MEQ tablet, Take 1 tablet (20 mEq total) by mouth 2 (two) times daily., Disp: 60 tablet, Rfl: 6 .  torsemide (DEMADEX) 20 MG tablet, Take 1 tablet (20 mg total) by mouth 2 (two) times daily., Disp: 60 tablet, Rfl: 6 .  silver sulfADIAZINE (SILVADENE) 1 % cream, Apply 1 application topically 2 (two) times daily. (Patient not taking: Reported on 10/26/2018), Disp: 50 g, Rfl: 0  Physical exam:  Vitals:   10/26/18 1112  BP: 138/83  Pulse: 61  Resp: 18  TempSrc: Tympanic  SpO2: 98%  Weight: 278 lb 1 oz (126.1 kg)   Physical Exam Constitutional:      Appearance: She is obese.     Comments: She ambulates with a wheelchair  HENT:     Head: Normocephalic and atraumatic.  Eyes:     Pupils: Pupils are equal, round, and reactive to light.  Neck:     Musculoskeletal: Normal range of motion.  Cardiovascular:     Rate and Rhythm: Normal rate and regular rhythm.     Heart sounds: Normal heart sounds.  Pulmonary:     Effort: Pulmonary effort is normal.     Breath sounds: Normal breath sounds.  Abdominal:     General: Bowel sounds are normal.     Palpations: Abdomen is soft.  Musculoskeletal:     Comments: Bilateral lower extremity edema  Skin:    General: Skin is warm and dry.  Neurological:     Mental Status: She is alert and oriented to person, place, and time.      CMP Latest Ref Rng & Units 10/26/2018  Glucose 70 - 99 mg/dL 98  BUN 8 - 23 mg/dL 25(H)  Creatinine 0.44 - 1.00 mg/dL 1.00  Sodium 135 - 145 mmol/L 140  Potassium 3.5 -  5.1 mmol/L 4.5  Chloride 98 - 111 mmol/L 101  CO2 22 - 32 mmol/L 29  Calcium 8.9 - 10.3 mg/dL 9.1  Total Protein 6.5 - 8.1 g/dL 8.3(H)  Total Bilirubin 0.3 - 1.2 mg/dL 0.5  Alkaline  Phos 38 - 126 U/L 58  AST 15 - 41 U/L 18  ALT 0 - 44 U/L 14   CBC Latest Ref Rng & Units 06/07/2018  WBC 3.6 - 11.0 K/uL 7.4  Hemoglobin 12.0 - 16.0 g/dL 13.2  Hematocrit 35.0 - 47.0 % 39.9  Platelets 150 - 440 K/uL 258    No images are attached to the encounter.  No results found.   Assessment and plan- Patient is a 73 y.o. female with invasive mammary carcinoma of the right breast Stage I A T1bN0M0 ER PR positive her 2 negative grade 2 s/p lumpectomy.  Oncotype score came back as low risk continue to require adjuvant chemotherapy.  She is status post adjuvant radiation treatment and currently on Arimidex  Overall she is tolerating Arimidex well she does have chronic back pain and joint pain which is unchanged and existed even prior to starting Arimidex.  She has not been taking her calcium and vitamin D as she was concerned empirically I doubt that the calcium would make her leg swelling worse and we have given her information on calcium vitamin D dosing which she will pick up over-the-counter.  I will see her back in 3 months time with no labs   Visit Diagnosis 1. Visit for monitoring Arimidex therapy   2. Encounter for follow-up surveillance of breast cancer      Dr. Randa Evens, MD, MPH [facility] [phone] 10/27/2018 11:08 AM

## 2018-11-02 ENCOUNTER — Encounter: Payer: Self-pay | Admitting: Oncology

## 2018-11-03 ENCOUNTER — Other Ambulatory Visit: Payer: Self-pay | Admitting: Oncology

## 2018-12-21 ENCOUNTER — Telehealth: Payer: Self-pay

## 2018-12-21 ENCOUNTER — Telehealth: Payer: Self-pay | Admitting: Cardiovascular Disease

## 2018-12-21 NOTE — Telephone Encounter (Signed)
Called patient from recall list.  No answer. [place] Patient is past due for 6 month follow up.  Need to schedule a telehealth visit.

## 2018-12-21 NOTE — Telephone Encounter (Signed)
Virtual Visit Pre-Appointment Phone Call  Steps For Call:  1. Confirm consent - "In the setting of the current Covid19 crisis, you are scheduled for a (phone or video) visit with your provider on (date) at (time).  Just as we do with many in-office visits, in order for you to participate in this visit, we must obtain consent.  If you'd like, I can send this to your mychart (if signed up) or email for you to review.  Otherwise, I can obtain your verbal consent now.  All virtual visits are billed to your insurance company just like a normal visit would be.  By agreeing to a virtual visit, we'd like you to understand that the technology does not allow for your provider to perform an examination, and thus may limit your provider's ability to fully assess your condition.  Finally, though the technology is pretty good, we cannot assure that it will always work on either your or our end, and in the setting of a video visit, we may have to convert it to a phone-only visit.  In either situation, we cannot ensure that we have a secure connection.  Are you willing to proceed?" STAFF: Did the patient verbally acknowledge consent to telehealth visit? Document YES/NO here: Yes  2. Confirm the BEST phone number to call the day of the visit by including in appointment notes  3. Give patient instructions for WebEx/MyChart download to smartphone as below or Doximity/Doxy.me if video visit (depending on what platform provider is using)  4. Advise patient to be prepared with their blood pressure, heart rate, weight, any heart rhythm information, their current medicines, and a piece of paper and pen handy for any instructions they may receive the day of their visit  5. Inform patient they will receive a phone call 15 minutes prior to their appointment time (may be from unknown caller ID) so they should be prepared to answer  6. Confirm that appointment type is correct in Epic appointment notes (VIDEO vs  PHONE)     TELEPHONE CALL NOTE  [name] has been deemed a candidate for a follow-up tele-health visit to limit community exposure during the Covid-19 pandemic. I spoke with the patient via phone to ensure availability of phone/video source, confirm preferred email & phone number, and discuss instructions and expectations.  I reminded [name] to be prepared with any vital sign and/or heart rhythm information that could potentially be obtained via home monitoring, at the time of her visit. I reminded [name] to expect a phone call at the time of her visit if her visit.  Ace Gins 12/21/2018 3:50 PM   INSTRUCTIONS FOR DOWNLOADING THE [place] APP TO SMARTPHONE  - If Apple, ask patient to go to [employer] and type in WebEx in the search bar. [place] [employer], the blue/green circle. If Android, go to [employer] and type in [employer] in the search bar. The app is free but as with any other app downloads, their phone may require them to verify saved payment information or Apple/Android password.  - The patient does NOT have to create an account. - On the day of the visit, the assist will walk the patient through joining the meeting with the meeting number/password.  INSTRUCTIONS FOR DOWNLOADING THE MYCHART APP TO SMARTPHONE  - The patient must first make sure to have activated MyChart and know their login information - If Apple, go to [employer] and type in MyChart in the search  bar and download the app. If Android, ask patient to go to [employer] and type in [place] in the search bar and download the app. The app is free but as with any other app downloads, their phone may require them to verify saved payment information or Apple/Android password.  - The patient will need to then log into the app with their MyChart username and password, and select [facility] as their healthcare provider to link the account. When it is time for your visit, go to  the MyChart app, find appointments, and click Begin Video Visit. Be sure to Select Allow for your device to access the Microphone and Camera for your visit. You will then be connected, and your provider will be with you shortly.  **If they have any issues connecting, or need assistance please contact MyChart service desk [phone])**  **If using a computer, in order to ensure the best quality for their visit they will need to use either of the following Internet Browsers: [employer], or Google Chrome**  IF USING DOXIMITY or DOXY.ME - The patient will receive a link just prior to their visit, either by text or email (to be determined day of appointment depending on if it's doxy.me or Doximity).     FULL LENGTH CONSENT FOR TELE-HEALTH VISIT   I hereby voluntarily request, consent and authorize [place] and its employed or contracted physicians, physician assistants, nurse practitioners or other licensed health care professionals (the Practitioner), to provide me with telemedicine health care services (the Services") as deemed necessary by the treating Practitioner. I acknowledge and consent to receive the Services by the Practitioner via telemedicine. I understand that the telemedicine visit will involve communicating with the Practitioner through live audiovisual communication technology and the disclosure of certain medical information by electronic transmission. I acknowledge that I have been given the opportunity to request an in-person assessment or other available alternative prior to the telemedicine visit and am voluntarily participating in the telemedicine visit.  I understand that I have the right to withhold or withdraw my consent to the use of telemedicine in the course of my care at any time, without affecting my right to future care or treatment, and that the Practitioner or I may terminate the telemedicine visit at any time. I understand that I have the right to  inspect all information obtained and/or recorded in the course of the telemedicine visit and may receive copies of available information for a reasonable fee.  I understand that some of the potential risks of receiving the Services via telemedicine include:   Delay or interruption in medical evaluation due to technological equipment failure or disruption;  Information transmitted may not be sufficient (e.g. poor resolution of images) to allow for appropriate medical decision making by the Practitioner; and/or   In rare instances, security protocols could fail, causing a breach of personal health information.  Furthermore, I acknowledge that it is my responsibility to provide information about my medical history, conditions and care that is complete and accurate to the best of my ability. I acknowledge that Practitioner's advice, recommendations, and/or decision may be based on factors not within their control, such as incomplete or inaccurate data provided by me or distortions of diagnostic images or specimens that may result from electronic transmissions. I understand that the practice of medicine is not an exact science and that Practitioner makes no warranties or guarantees regarding treatment outcomes. I acknowledge that I will receive a copy of this consent concurrently  upon execution via email to the email address I last provided but may also request a printed copy by calling the office of [facility].    I understand that my insurance will be billed for this visit.   I have read or had this consent read to me.  I understand the contents of this consent, which adequately explains the benefits and risks of the Services being provided via telemedicine.   I have been provided ample opportunity to ask questions regarding this consent and the Services and have had my questions answered to my satisfaction.  I give my informed consent for the services to be provided through the use of  telemedicine in my medical care  By participating in this telemedicine visit I agree to the above.

## 2018-12-26 NOTE — Progress Notes (Signed)
Virtual Visit via Telephone Note   This visit type was conducted due to national recommendations for restrictions regarding the COVID-19 Pandemic (e.g. social distancing) in an effort to limit this patient's exposure and mitigate transmission in our community.  Due to her co-morbid illnesses, this patient is at least at moderate risk for complications without adequate follow up.  This format is felt to be most appropriate for this patient at this time.  The patient did not have access to video technology/had technical difficulties with video requiring transitioning to audio format only (telephone).  All issues noted in this document were discussed and addressed.  No physical exam could be performed with this format.  Please refer to the patient's chart for her  consent to telehealth for [facility].   I connected with  [name] on 12/26/18 by a video enabled telemedicine application and verified that I am speaking with the correct person using two identifiers. I discussed the limitations of evaluation and management by telemedicine. The patient expressed understanding and agreed to proceed.   Evaluation Performed:  Follow-up visit  Date:  12/26/2018   ID:  [name], DOB November 10, 1945, MRN [id number]  Patient Location:  [address] [place] [address]   Provider location:   [facility], [place] office  PCP:  Donnie Coffin, MD  Cardiologist:  Patsy Baltimore   Chief Complaint: Cough, leg swelling    History of Present Illness:    [name] is a 73 y.o. female who presents via audio/video conferencing for a telehealth visit today.   The patient does not symptoms concerning for COVID-19 infection (fever, chills, cough, or new SHORTNESS OF BREATH).   Patient has a past medical history of Medication confusion/Medication noncompliance No family to help, uses [employer] for transportation morbid obesity,  severe lower extremity edema,   systolic CHF, ejection fraction 25% in 2012  , Up to EF 50% to 55% in 03/2017 sleep apnea, scheduled for repeat testing. Previous machine broke pulmonary disease,  severe hypertension,  likely secondary to medication noncompliance who presents For follow-up of her hypertension, cardiomyopathy, edema  Granddaughter, son, neighbor help with groceries Cut back on breads, sweets, Try to get her weight down  Has a CPAP but unable to use it  Received a new machine from [place] mask is too small (she said it was never fitted to her, just dropped off by the company) Has to pay off to get a new mask She has to pay this off slowly so currently machine is unusable Prior machine mask was broken or machine was broken and had to upgrade to new machine  Having muscle cramps, chronic issue Working with PT, leg weakness, no recent falls  Lots of coughing  torsemide twice a day Denies excessive fluid intake  Legs continue to be swollen Was never seen for lymphedema Does not have lymphedema compression pumps  Drinks a lot of water, family brought her bottle water   Other past medical history reviewed hospitalization July 2018 for TIA and chest pain Slurring, head hurt, H/A, felt hot She had MRI brain without acute finding Neurology consult with no clear recommendations Chest pain resolved without intervention Carotid ultrasound without significant stenosis SNF was recommended  Walking less, legs are getting weaker, gait instability  On previous office visit in 2014, she reported having occasional skin breakdown, weeping sores, worse after spending time on her feet she denies any significant shortness of breath.  Prior CV studies:   The following studies were reviewed today:    Past Medical History:  Diagnosis Date  . Abnormality of gait   . 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   . CHF (congestive heart failure) ([place])    March 28, 2017 echo: EF 50-55%; 1) echo 07/2011: EF 20-25%, mild concentric hypertrophy, diffuse HK, regional WMA cannot be excluded, grade 1 DD, mitral valve with mild regurg, LA mildly dilated). 2) EF 40-45%, mild AS,                    . Complication of anesthesia 2006   pt states "hard to wake up" after L breast surgery   . Edema   . GERD (gastroesophageal reflux disease)   . Headache   . Heart murmur   . Hypertension   . Hypokinesis    global, EF 35-45 % from Echo.  . Malignant neoplasm of breast (female), unspecified site 06/14/2018   T1b, N0; ER/PR positive, HER-2 negative.  . Neuropathy   . Obesity   . Pain in joint, site unspecified   . Pneumonia   . Sleep apnea   . Tinea pedis   . Unspecified hearing loss    bilateral   Past Surgical History:  Procedure Laterality Date  . BREAST LUMPECTOMY  2006   left breast   . CATARACT EXTRACTION     left eye  . HERNIA REPAIR    . PARTIAL MASTECTOMY WITH NEEDLE LOCALIZATION Right 06/14/2018   Procedure: PARTIAL MASTECTOMY WITH NEEDLE LOCALIZATION;  Surgeon: Robert Bellow, MD;  Location: [facility] ORS;  Service: General;  Laterality: Right;  . SENTINEL NODE BIOPSY Right 06/14/2018   Procedure: SENTINEL NODE BIOPSY;  Surgeon: Robert Bellow, MD;  Location: [facility] ORS;  Service: General;  Laterality: Right;  . TUBAL LIGATION       No outpatient medications have been marked as taking for the 12/27/18 encounter (Appointment) with Minna Merritts, MD.     Allergies:   Patient has no known allergies.   Social History   Tobacco Use  . Smoking status: Former Smoker    Packs/day: 0.50    Years: 30.00    Pack years: 15.00    Types: Cigarettes    Last attempt to quit: 07/14/2000    Years since quitting: 18.4  . Smokeless tobacco: Never Used  Substance Use Topics  . Alcohol use: No  . Drug use: Not Currently    Types: Marijuana    Comment: endorses prior marijuana use  1999     Current Outpatient Medications on File Prior to Visit  Medication Sig Dispense Refill  . anastrozole (ARIMIDEX) 1 MG tablet TAKE 1 TABLET BY MOUTH EVERY DAY 30 tablet 1  . aspirin EC 81 MG tablet Take 81 mg by mouth daily.      . Calcium Carbonate-Vit D-Min (CALCIUM 600+D PLUS MINERALS) 600-400 MG-UNIT CHEW Chew 1 tablet by mouth 2 (two) times daily. 60 each 6  . cloNIDine (CATAPRES) 0.2 MG tablet Take 1 tablet (0.2 mg total) by mouth 2 (two) times daily. 60 tablet 11  . clotrimazole (LOTRIMIN) 1 % cream Apply 1 application topically 2 (two) times daily.   1  . Incontinence Supply Disposable (BLADDER CONTROL PADS EX ABSORB) MISC     . Incontinence Supply Disposable (BLADDER CONTROL PADS EX ABSORB) MISC     . isosorbide mononitrate (IMDUR)  60 MG 24 hr tablet Take 1 tablet (60 mg total) by mouth 2 (two) times daily. 180 tablet 3  . lidocaine-prilocaine (EMLA) cream Apply cream to areola one hour prior to procedure and cover with plastic wrap. 30 g 0  . losartan (COZAAR) 25 MG tablet Take 25 mg by mouth daily.     Marland Kitchen omeprazole (PRILOSEC) 20 MG capsule Take 20 mg by mouth daily.    Marland Kitchen oxybutynin (DITROPAN) 5 MG tablet Take 5 mg by mouth 2 (two) times daily.    . potassium chloride SA (K-DUR,KLOR-CON) 20 MEQ tablet Take 1 tablet (20 mEq total) by mouth 2 (two) times daily. 60 tablet 6  . silver sulfADIAZINE (SILVADENE) 1 % cream Apply 1 application topically 2 (two) times daily. (Patient not taking: Reported on 10/26/2018) 50 g 0  . torsemide (DEMADEX) 20 MG tablet Take 1 tablet (20 mg total) by mouth 2 (two) times daily. 60 tablet 6   No current facility-administered medications on file prior to visit.      Family Hx: The patient's family history includes Breast cancer (age of onset: 7) in an other family member; Cataracts in her mother; Coronary artery disease in an other family member; Hypertension in her mother.  ROS:   Please see the history of present illness.    Review of  Systems  Constitutional: Negative.   Respiratory: Positive for cough and shortness of breath.   Cardiovascular: Positive for leg swelling.  Gastrointestinal: Negative.   Musculoskeletal: Negative.   Neurological: Negative.   Psychiatric/Behavioral: Negative.   All other systems reviewed and are negative.    Labs/Other Tests and Data Reviewed:    Recent Labs: 06/07/2018: Hemoglobin 13.2; Platelets 258 10/26/2018: ALT 14; BUN 25; Creatinine, Ser 1.00; Potassium 4.5; Sodium 140   Recent Lipid Panel Lab Results  Component Value Date/Time   CHOL 141 03/28/2017 06:39 AM   TRIG 40 03/28/2017 06:39 AM   HDL 42 03/28/2017 06:39 AM   CHOLHDL 3.4 03/28/2017 06:39 AM   LDLCALC 91 03/28/2017 06:39 AM    Wt Readings from Last 3 Encounters:  10/26/18 278 lb 1 oz (126.1 kg)  10/05/18 283 lb 9.6 oz (128.6 kg)  07/29/18 278 lb (126.1 kg)     Exam:    Vital Signs: Vital signs may also be detailed in the HPI There were no vitals taken for this visit.  Wt Readings from Last 3 Encounters:  10/26/18 278 lb 1 oz (126.1 kg)  10/05/18 283 lb 9.6 oz (128.6 kg)  07/29/18 278 lb (126.1 kg)   Temp Readings from Last 3 Encounters:  10/05/18 97.7 F (36.5 C) (Temporal)  07/29/18 (!) 97.2 F (36.2 C) (Temporal)   BP Readings from Last 3 Encounters:  10/26/18 138/83  10/05/18 (!) 165/84  07/29/18 (!) 146/77   Pulse Readings from Last 3 Encounters:  10/26/18 61  10/05/18 72  07/29/18 60     Well nourished, well developed female in no acute distress. Constitutional:  oriented to person, place, and time. No distress.     ASSESSMENT & PLAN:    Chronic systolic CHF (congestive heart failure) (HCC) Now with chronic diastolic CHF Recommend she stay on potassium daily torsemide 20 twice daily Suggested she cut back on her bottle water If she has weight gain, abdominal tightness worsening leg swelling suggested she try torsemide 40 twice daily for 1 day then back down to 20 twice daily   Morbid obesity ([place]) We have encouraged continued exercise, careful diet management in  an effort to lose weight.  SOB (shortness of breath) Chronic issue secondary to morbid obesity deconditioning, heart failure  Essential hypertension Blood pressure is well controlled on today's visit. No changes made to the medications.  OSA on CPAP Long discussion with her Suggested that she try to take the old mask offer previous CPAP inputted on her new machine Current mask on new machine does not fit her face  Lymphedema Will recommend she follow-up with vein and vascular Consideration of lymphedema compression pumps  COVID-19 Education: The signs and symptoms of COVID-19 were discussed with the patient and how to seek care for testing (follow up with PCP or arrange E-visit).  The importance of social distancing was discussed today.  Patient Risk:   After full review of this patients clinical status, I feel that they are at least moderate risk at this time.  Time:   Today, I have spent 25 minutes with the patient with telehealth technology discussing the cardiac and medical problems/diagnoses detailed above   10 min spent reviewing the chart prior to patient visit today   Medication Adjustments/Labs and Tests Ordered: Current medicines are reviewed at length with the patient today.  Concerns regarding medicines are outlined above.   Tests Ordered: No tests ordered   Medication Changes: No changes made   Disposition: Follow-up in 6 months   Signed, Ida Rogue, MD  12/26/2018 10:26 AM    [facility] [address] [place] [address], [place], [place] [address]

## 2018-12-27 ENCOUNTER — Telehealth (INDEPENDENT_AMBULATORY_CARE_PROVIDER_SITE_OTHER): Payer: Medicare HMO | Admitting: Cardiovascular Disease

## 2018-12-27 ENCOUNTER — Other Ambulatory Visit: Payer: Self-pay

## 2018-12-27 DIAGNOSIS — I10 Essential (primary) hypertension: Secondary | ICD-10-CM

## 2018-12-27 DIAGNOSIS — I5022 Chronic systolic (congestive) heart failure: Secondary | ICD-10-CM | POA: Diagnosis not present

## 2018-12-27 DIAGNOSIS — G4733 Obstructive sleep apnea (adult) (pediatric): Secondary | ICD-10-CM

## 2018-12-27 DIAGNOSIS — R0602 Shortness of breath: Secondary | ICD-10-CM

## 2018-12-27 DIAGNOSIS — I110 Hypertensive heart disease with heart failure: Secondary | ICD-10-CM | POA: Diagnosis not present

## 2018-12-27 DIAGNOSIS — Z9989 Dependence on other enabling machines and devices: Secondary | ICD-10-CM

## 2018-12-27 DIAGNOSIS — I872 Venous insufficiency (chronic) (peripheral): Secondary | ICD-10-CM

## 2018-12-27 MED ORDER — TORSEMIDE 20 MG PO TABS: 20.0000 mg | ORAL_TABLET | Freq: Two times a day (BID) | ORAL | 3 refills | Status: DC

## 2018-12-27 NOTE — Patient Instructions (Addendum)
Medication Instructions:  Your physician recommends that you continue on your current medications as directed. Please refer to the Current Medication list given to you today.  - torsemide has been refilled   If you need a refill on your cardiac medications before your next appointment, please call your pharmacy.    Lab work: No new labs needed   If you have labs (blood work) drawn today and your tests are completely normal, you will receive your results only by: Marland Kitchen MyChart Message (if you have MyChart) OR . A paper copy in the mail If you have any lab test that is abnormal or we need to change your treatment, we will call you to review the results.   Testing/Procedures: You have been referred to: Vein and Vascular ([facility]) for  lymphedema compression pumps (Once virus pandemic has resolved   Follow-Up: At [facility], you and your health needs are our priority.  As part of our continuing mission to provide you with exceptional heart care, we have created designated Provider Care Teams.  These Care Teams include your primary Cardiologist (physician) and Advanced Practice Providers (APPs -  Physician Assistants and Nurse Practitioners) who all work together to provide you with the care you need, when you need it.  . You will need a follow up appointment in 6 months   Please call our office 2 months in advance to schedule this appointment.  (Call in early August to schedule).  . Providers on your designated Care Team:   . Murray Hodgkins, NP . Christell Faith, PA-C . Marrianne Mood, PA-C  Any Other Special Instructions Will Be Listed Below (If Applicable).  For educational health videos Log in to : [email or web address] Or : [email or web address], password : [email or web address]

## 2018-12-30 ENCOUNTER — Other Ambulatory Visit: Payer: Self-pay | Admitting: Oncology

## 2019-01-04 ENCOUNTER — Ambulatory Visit: Payer: Medicare HMO | Admitting: General Surgery

## 2019-01-21 ENCOUNTER — Other Ambulatory Visit: Payer: Self-pay

## 2019-01-24 ENCOUNTER — Inpatient Hospital Stay: Payer: Medicare HMO | Attending: Oncology | Admitting: Oncology

## 2019-01-24 ENCOUNTER — Encounter: Payer: Self-pay | Admitting: Oncology

## 2019-01-24 DIAGNOSIS — C50911 Malignant neoplasm of unspecified site of right female breast: Secondary | ICD-10-CM

## 2019-01-24 DIAGNOSIS — Z08 Encounter for follow-up examination after completed treatment for malignant neoplasm: Secondary | ICD-10-CM

## 2019-01-24 DIAGNOSIS — Z853 Personal history of malignant neoplasm of breast: Secondary | ICD-10-CM

## 2019-01-24 DIAGNOSIS — Z79899 Other long term (current) drug therapy: Secondary | ICD-10-CM

## 2019-01-24 DIAGNOSIS — Z87891 Personal history of nicotine dependence: Secondary | ICD-10-CM

## 2019-01-24 DIAGNOSIS — Z79811 Long term (current) use of aromatase inhibitors: Secondary | ICD-10-CM

## 2019-01-24 DIAGNOSIS — Z170 Estrogen receptor positive status [ER+]: Secondary | ICD-10-CM

## 2019-01-24 DIAGNOSIS — Z5181 Encounter for therapeutic drug level monitoring: Secondary | ICD-10-CM

## 2019-01-24 DIAGNOSIS — Z7982 Long term (current) use of aspirin: Secondary | ICD-10-CM

## 2019-01-24 NOTE — Progress Notes (Signed)
I connected with [name] on 01/24/19 at 10:30 AM EDT by telephone visit and verified that I am speaking with the correct person using two identifiers.   I discussed the limitations, risks, security and privacy concerns of performing an evaluation and management service by telemedicine and the availability of in-person appointments. I also discussed with the patient that there may be a patient responsible charge related to this service. The patient expressed understanding and agreed to proceed.  Other persons participating in the visit and their role in the encounter:  none  Patient's location:  home Provider's location:  home  Chief Complaint: Routine follow-up of breast cancer on Arimidex  History of present illness: patient is a 73 year old female with a prior history of left breast cancer in 2006. 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 She has been in remission since then. She has been getting periodic mammograms and the most recent mammogram and ultrasound in August 2019 showed 0.9 x 0.9 x 0.8 cm irregular spiculated mass in the 11 o'clock position of the right breast. There was a concern for an abnormal intramammary lymph node as well. No other abnormal axillary adenopathy noted. Patient underwent ultrasound-guided biopsy of both the right breast mass as well as the axillary lymph node lymph node biopsy was negative for malignancy. Biopsy of the right breast mass showed 6 mm, grade 1 ER PR positive and HER-2/neu negative tumor.  Patient is morbidly obese and has difficulty with ambulation. She uses a cane or a walker to ambulate around the house. She sees Dr. Rockey Situ for her cardiology issues. Reports that her appetite is good and she denies any unintentional weight loss or any new aches or pains anywhere. Family history significant for breast cancer in her sister who died from breast cancer in her 6s. genetic  testing was negative  Final pathology showed7 mm grade 2 invasive mammary carcinoma ER PR positive her 2 negative. Negative nodes. Clear margins  Oncotype score came as low risk 8 and she does not require adjuvant chemotherapy She completed adjuvant radiation and started Arimidex in December 2019   Interval history she reports tolerating Arimidex well.  She has had chronic joint pain and reports that her right leg pain has been worse recently.  At baseline she ambulates with the help of a walker.  She has been using some over-the-counter muscle relaxers and IcyHot cream which has been helping.   Review of Systems  Constitutional: Positive for malaise/fatigue. Negative for chills, fever and weight loss.  HENT: Negative for congestion, ear discharge and nosebleeds.   Eyes: Negative for blurred vision.  Respiratory: Negative for cough, hemoptysis, sputum production, shortness of breath and wheezing.   Cardiovascular: Negative for chest pain, palpitations, orthopnea and claudication.  Gastrointestinal: Negative for abdominal pain, blood in stool, constipation, diarrhea, heartburn, melena, nausea and vomiting.  Genitourinary: Negative for dysuria, flank pain, frequency, hematuria and urgency.  Musculoskeletal: Negative for back pain, joint pain and myalgias.       Right leg pain  Skin: Negative for rash.  Neurological: Negative for dizziness, tingling, focal weakness, seizures, weakness and headaches.  Endo/Heme/Allergies: Does not bruise/bleed easily.  Psychiatric/Behavioral: Negative for depression and suicidal ideas. The patient does not have insomnia.     No Known Allergies  Past Medical History:  Diagnosis Date  . Abnormality of gait   . Breast cancer ([place]) 02/05/2005   3.5 cm invasive mammary carcinoma, T2, N0,; triple negative, whole breast  radiation, cytoxan, taxotere chemotherapy.   . CHF (congestive heart failure) ([place])    March 28, 2017 echo: EF 50-55%; 1) echo 07/2011: EF  20-25%, mild concentric hypertrophy, diffuse HK, regional WMA cannot be excluded, grade 1 DD, mitral valve with mild regurg, LA mildly dilated). 2) EF 40-45%, mild AS,                    . Complication of anesthesia 2006   pt states "hard to wake up" after L breast surgery   . Edema   . GERD (gastroesophageal reflux disease)   . Headache   . Heart murmur   . Hypertension   . Hypokinesis    global, EF 35-45 % from Echo.  . Malignant neoplasm of breast (female), unspecified site 06/14/2018   T1b, N0; ER/PR positive, HER-2 negative.  . Neuropathy   . Obesity   . Pain in joint, site unspecified   . Pneumonia   . Sleep apnea   . Tinea pedis   . Unspecified hearing loss    bilateral    Past Surgical History:  Procedure Laterality Date  . BREAST LUMPECTOMY  2006   left breast   . CATARACT EXTRACTION     left eye  . HERNIA REPAIR    . PARTIAL MASTECTOMY WITH NEEDLE LOCALIZATION Right 06/14/2018   Procedure: PARTIAL MASTECTOMY WITH NEEDLE LOCALIZATION;  Surgeon: Robert Bellow, MD;  Location: [facility] ORS;  Service: General;  Laterality: Right;  . SENTINEL NODE BIOPSY Right 06/14/2018   Procedure: SENTINEL NODE BIOPSY;  Surgeon: Robert Bellow, MD;  Location: [facility] ORS;  Service: General;  Laterality: Right;  . TUBAL LIGATION      Social History   Socioeconomic History  . Marital status: Divorced    Spouse name: Not on file  . Number of children: Not on file  . Years of education: Not on file  . Highest education level: Not on file  Occupational History  . Occupation: retired  Scientific laboratory technician  . Financial resource strain: Not on file  . Food insecurity:    Worry: Not on file    Inability: Not on file  . Transportation needs:    Medical: Not on file    Non-medical: Not on file  Tobacco Use  . Smoking status: Former Smoker    Packs/day: 0.50    Years: 30.00    Pack years: 15.00    Types: Cigarettes    Last attempt to quit: 07/14/2000    Years since quitting: 18.5  .  Smokeless tobacco: Never Used  Substance and Sexual Activity  . Alcohol use: No  . Drug use: Not Currently    Types: Marijuana    Comment: endorses prior marijuana use 1999  . Sexual activity: Never  Lifestyle  . Physical activity:    Days per week: Not on file    Minutes per session: Not on file  . Stress: Not on file  Relationships  . Social connections:    Talks on phone: Not on file    Gets together: Not on file    Attends religious service: Not on file    Active member of club or organization: Not on file    Attends meetings of clubs or organizations: Not on file    Relationship status: Not on file  . Intimate partner violence:    Fear of current or ex partner: Not on file    Emotionally abused: Not on file    Physically abused: Not  on file    Forced sexual activity: Not on file  Other Topics Concern  . Not on file  Social History Narrative  . Not on file    Family History  Problem Relation Age of Onset  . Hypertension Mother        deceased 62  . Cataracts Mother   . Breast cancer Other 76       maternal half-sister; deceased 68  . Coronary artery disease Other      Current Outpatient Medications:  .  anastrozole (ARIMIDEX) 1 MG tablet, TAKE 1 TABLET BY MOUTH EVERY DAY, Disp: 30 tablet, Rfl: 1 .  aspirin EC 81 MG tablet, Take 81 mg by mouth daily.  , Disp: , Rfl:  .  Calcium Carbonate-Vit D-Min (CALCIUM 600+D PLUS MINERALS) 600-400 MG-UNIT CHEW, Chew 1 tablet by mouth 2 (two) times daily., Disp: 60 each, Rfl: 6 .  cloNIDine (CATAPRES) 0.2 MG tablet, Take 1 tablet (0.2 mg total) by mouth 2 (two) times daily., Disp: 60 tablet, Rfl: 11 .  clotrimazole (LOTRIMIN) 1 % cream, Apply 1 application topically 2 (two) times daily. , Disp: , Rfl: 1 .  Incontinence Supply Disposable (BLADDER CONTROL PADS EX ABSORB) MISC, , Disp: , Rfl:  .  Incontinence Supply Disposable (BLADDER CONTROL PADS EX ABSORB) MISC, , Disp: , Rfl:  .  isosorbide mononitrate (IMDUR) 60 MG 24 hr  tablet, Take 1 tablet (60 mg total) by mouth 2 (two) times daily., Disp: 180 tablet, Rfl: 3 .  losartan (COZAAR) 25 MG tablet, Take 25 mg by mouth daily. , Disp: , Rfl:  .  omeprazole (PRILOSEC) 20 MG capsule, Take 20 mg by mouth daily., Disp: , Rfl:  .  oxybutynin (DITROPAN) 5 MG tablet, Take 5 mg by mouth 2 (two) times daily., Disp: , Rfl:  .  potassium chloride SA (K-DUR) 20 MEQ tablet, Take 1 tablet (20 meq) by mouth once daily, Disp: , Rfl:  .  silver sulfADIAZINE (SILVADENE) 1 % cream, Apply 1 application topically 2 (two) times daily., Disp: 50 g, Rfl: 0 .  torsemide (DEMADEX) 20 MG tablet, Take 1 tablet (20 mg total) by mouth 2 (two) times daily., Disp: 180 tablet, Rfl: 3  No results found.  No images are attached to the encounter.   CMP Latest Ref Rng & Units 10/26/2018  Glucose 70 - 99 mg/dL 98  BUN 8 - 23 mg/dL 25(H)  Creatinine 0.44 - 1.00 mg/dL 1.00  Sodium 135 - 145 mmol/L 140  Potassium 3.5 - 5.1 mmol/L 4.5  Chloride 98 - 111 mmol/L 101  CO2 22 - 32 mmol/L 29  Calcium 8.9 - 10.3 mg/dL 9.1  Total Protein 6.5 - 8.1 g/dL 8.3(H)  Total Bilirubin 0.3 - 1.2 mg/dL 0.5  Alkaline Phos 38 - 126 U/L 58  AST 15 - 41 U/L 18  ALT 0 - 44 U/L 14   CBC Latest Ref Rng & Units 06/07/2018  WBC 3.6 - 11.0 K/uL 7.4  Hemoglobin 12.0 - 16.0 g/dL 13.2  Hematocrit 35.0 - 47.0 % 39.9  Platelets 150 - 440 K/uL 258    Assessment and plan: Patient is a 73 year old female with a history of invasive mammary, of the right breast in 2018 [id number] ER PR positive HER-2/neu negative status post lumpectomy and adjuvant radiation therapy currently on Arimidex  Patient is tolerating Arimidex well overall.  She has had some chronic back pain and joint pain which is chronic even prior to starting Arimidex.  Clinically musculoskeletal and  do not suspect breast cancer recurrence at this time.  Follow-up instructions: I will see her back in 3 months in my clinic.  She will call us if her leg pain is getting  worse.  She will be due for a repeat mammogram in October 2020  I discussed the assessment and treatment plan with the patient. The patient was provided an opportunity to ask questions and all were answered. The patient agreed with the plan and demonstrated an understanding of the instructions.   The patient was advised to call back or seek an in-person evaluation if the symptoms worsen or if the condition fails to improve as anticipated.  I provided 16 minutes of non face-to-face telephone visit time during this encounter, and > 50% was spent counseling as documented under my assessment & plan.  Visit Diagnosis: 1. Encounter for follow-up surveillance of breast cancer   2. Visit for monitoring Arimidex therapy     Dr. Randa Evens, MD, MPH [facility] at [facility] Pager- [phone] 01/24/2019 11:03 AM

## 2019-01-24 NOTE — Progress Notes (Signed)
Pt having pain in leg that she had had for a while but it has got worse. She states that pain *8. She feels that it is hurting and burnsform inside. She called PCP and they gave her muscle relaxers and it did not work and her son got her  Icy hot cream and it helped a little. She has decreased appetite and she eats 2 meals a day and drinks ensure once a day. Her right breast nipple at times gets hard. She is having good Bowel movements-pt states her stomach bothers her at times

## 2019-01-30 ENCOUNTER — Other Ambulatory Visit: Payer: Self-pay | Admitting: Radiation Oncology

## 2019-02-01 ENCOUNTER — Encounter: Payer: Self-pay | Admitting: General Surgery

## 2019-02-01 ENCOUNTER — Other Ambulatory Visit: Payer: Self-pay

## 2019-02-01 ENCOUNTER — Ambulatory Visit (INDEPENDENT_AMBULATORY_CARE_PROVIDER_SITE_OTHER): Payer: Medicare HMO | Admitting: General Surgery

## 2019-02-01 VITALS — BP 158/82 | HR 61 | Temp 97.5°F | Ht 64.0 in | Wt 284.0 lb

## 2019-02-01 DIAGNOSIS — C50411 Malignant neoplasm of upper-outer quadrant of right female breast: Secondary | ICD-10-CM | POA: Diagnosis not present

## 2019-02-01 DIAGNOSIS — Z170 Estrogen receptor positive status [ER+]: Secondary | ICD-10-CM

## 2019-02-01 NOTE — Progress Notes (Addendum)
Patient ID: [name], female   DOB: [birth year], 73 y.o.   MRN: [id number]  Chief Complaint  Patient presents with  . Breast Cancer    HPI [name] is a 73 y.o. female here today for her follow up breast check up. Patient is short of breath, been going on for a while. Patient states she sleeps with 6 pillows at night to minimize shortness of breath. HPI  Past Medical History:  Diagnosis Date  . Abnormality of gait   . Breast cancer ([place]) 02/05/2005   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   . CHF (congestive heart failure) ([place])    March 28, 2017 echo: EF 50-55%; 1) echo 07/2011: EF 20-25%, mild concentric hypertrophy, diffuse HK, regional WMA cannot be excluded, grade 1 DD, mitral valve with mild regurg, LA mildly dilated). 2) EF 40-45%, mild AS,                    . Complication of anesthesia 2006   pt states "hard to wake up" after L breast surgery   . Edema   . GERD (gastroesophageal reflux disease)   . Headache   . Heart murmur   . Hypertension   . Hypokinesis    global, EF 35-45 % from Echo.  . Malignant neoplasm of breast (female), unspecified site 06/14/2018   RIGHT T1b, N0; ER/PR positive, HER-2 negative.  . Neuropathy   . Obesity   . Pain in joint, site unspecified   . Pneumonia   . Sleep apnea   . Tinea pedis   . Unspecified hearing loss    bilateral    Past Surgical History:  Procedure Laterality Date  . BREAST LUMPECTOMY  2006   left breast   . CATARACT EXTRACTION     left eye  . HERNIA REPAIR  05/25/2006   Incarcerated omentum in ventral hernia, Ventralight mesh, combined lap/ open with omental resection.   Marland Kitchen PARTIAL MASTECTOMY WITH NEEDLE LOCALIZATION Right 06/14/2018   Procedure: PARTIAL MASTECTOMY WITH NEEDLE LOCALIZATION;  Surgeon: Robert Bellow, MD;  Location: [facility] ORS;  Service: General;  Laterality: Right;  . SENTINEL NODE BIOPSY Right 06/14/2018    Procedure: SENTINEL NODE BIOPSY;  Surgeon: Robert Bellow, MD;  Location: [facility] ORS;  Service: General;  Laterality: Right;  . TUBAL LIGATION      Family History  Problem Relation Age of Onset  . Hypertension Mother        deceased 36  . Cataracts Mother   . Breast cancer Other 41       maternal half-sister; deceased 36  . Coronary artery disease Other     Social History Social History   Tobacco Use  . Smoking status: Former Smoker    Packs/day: 0.50    Years: 30.00    Pack years: 15.00    Types: Cigarettes    Last attempt to quit: 07/14/2000    Years since quitting: 18.5  . Smokeless tobacco: Never Used  Substance Use Topics  . Alcohol use: No  . Drug use: Not Currently    Types: Marijuana    Comment: endorses prior marijuana use 1999    No Known Allergies  Current Outpatient Medications  Medication Sig Dispense Refill  . anastrozole (ARIMIDEX) 1 MG tablet TAKE 1 TABLET BY MOUTH EVERY DAY 30 tablet 1  . aspirin EC 81 MG tablet Take 81 mg by mouth daily.      Marland Kitchen  Calcium Carbonate-Vit D-Min (CALCIUM 600+D PLUS MINERALS) 600-400 MG-UNIT CHEW Chew 1 tablet by mouth 2 (two) times daily. 60 each 6  . cloNIDine (CATAPRES) 0.2 MG tablet Take 1 tablet (0.2 mg total) by mouth 2 (two) times daily. 60 tablet 11  . clotrimazole (LOTRIMIN) 1 % cream Apply 1 application topically 2 (two) times daily.   1  . Incontinence Supply Disposable (BLADDER CONTROL PADS EX ABSORB) MISC     . Incontinence Supply Disposable (BLADDER CONTROL PADS EX ABSORB) MISC     . losartan (COZAAR) 25 MG tablet Take 25 mg by mouth daily.     Marland Kitchen omeprazole (PRILOSEC) 20 MG capsule Take 20 mg by mouth daily.    Marland Kitchen oxybutynin (DITROPAN) 5 MG tablet Take 5 mg by mouth 2 (two) times daily.    . potassium chloride SA (K-DUR) 20 MEQ tablet Take 1 tablet (20 meq) by mouth once daily    . silver sulfADIAZINE (SILVADENE) 1 % cream Apply 1 application topically 2 (two) times daily. 50 g 0  . torsemide (DEMADEX) 20 MG  tablet Take 1 tablet (20 mg total) by mouth 2 (two) times daily. 180 tablet 3  . isosorbide mononitrate (IMDUR) 60 MG 24 hr tablet Take 1 tablet (60 mg total) by mouth 2 (two) times daily. 180 tablet 3   No current facility-administered medications for this visit.     Review of Systems Review of Systems  Constitutional: Negative.   Respiratory: Negative.   Cardiovascular: Negative.     Blood pressure (!) 158/82, pulse 61, temperature (!) 97.5 F (36.4 C), temperature source Skin, height 5' 4"  (1.626 m), weight 284 lb (128.8 kg), SpO2 90 %.  Physical Exam Physical Exam Exam conducted with a chaperone present.  Constitutional:      Appearance: She is well-developed.  Eyes:     General: No scleral icterus.    Conjunctiva/sclera: Conjunctivae normal.  Neck:     Musculoskeletal: Normal range of motion.     Vascular: JVD (5 cm above clavicle in seated position. ) present.  Cardiovascular:     Rate and Rhythm: Normal rate and regular rhythm.     Heart sounds: Normal heart sounds.  Pulmonary:     Effort: Pulmonary effort is normal.     Breath sounds: Normal breath sounds.  Chest:     Breasts:        Right: No inverted nipple, mass, nipple discharge, skin change or tenderness.        Left: No inverted nipple, mass, nipple discharge, skin change or tenderness.    Musculoskeletal:     Right lower leg: 2+ Edema present.     Left lower leg: 2+ Edema present.  Lymphadenopathy:     Cervical: No cervical adenopathy.  Skin:    General: Skin is warm and dry.  Neurological:     Mental Status: She is alert and oriented to person, place, and time.     Data Reviewed December 27, 2018 telephone visit with Dr. Rockey Situ from cardiology reviewed.  Assessment No evidence of recurrent breast cancer.  Clinical exam and history suggestive of worsening  right sided heart failure.  Plan  The patient has been asked to return to the office in 5 months with a bilateral diagnostic mammogram.  A  message will be sent to Dr. Rockey Situ regarding today's clinical exam.  HPI, Physical Exam, Assessment and Plan have been scribed under the direction and in the presence of Hervey Ard, MD.  Gaspar Cola, CMA  I have completed the exam and reviewed the above documentation for accuracy and completeness.  I agree with the above.  Haematologist has been used and any errors in dictation or transcription are unintentional.  Hervey Ard, M.D., F.A.C.S.  Forest Gleason Jerron Niblack 02/02/2019, 7:10 AM

## 2019-02-01 NOTE — Patient Instructions (Signed)
The patient has been asked to return to the office in 5 months with a bilateral diagnostic mammogram.

## 2019-02-02 ENCOUNTER — Encounter: Payer: Self-pay | Admitting: General Surgery

## 2019-02-07 ENCOUNTER — Telehealth: Payer: Self-pay | Admitting: *Deleted

## 2019-02-07 NOTE — Telephone Encounter (Signed)
Left voicemail message to call back for follow up on how she is doing.

## 2019-02-07 NOTE — Telephone Encounter (Signed)
-----   Message from Minna Merritts, MD sent at 02/01/2019  9:36 PM EDT ----- Patient was seen by Dr. Bary Castilla  He noted CHF sx, Can we check with patient on her weight and symptoms She is supposed to take torsemide 20 BID Up to 40 BID for one day for symptoms concerning for CHF Thx TG

## 2019-02-08 NOTE — Telephone Encounter (Signed)
I spoke with the patient regarding her symptoms and Dr. Donivan Scull recommendations.  The patient states she mostly has muscle spasms at night. She is having some swelling to her lower extremities. Her weight is 283 lbs, which she thinks is a little high for her. Breathing is stable per her report.  She asked that I called her daughter, Governor Specking with Dr. Donivan Scull recommendations as she is managing her medications.  I spoke with Governor Specking and she confirms the patient is having lower extremity edema. However, Governor Specking advises that the patient is consuming a lot of salt and drinking a lot of sodas. She is trying to educate her against this, but states her brother will buy her mother whatever she wants from the grocery store against Ekmona's advise.  She will increase the patient's torsemide to 40 mg BID x 1 day, then resume 20 mg BID. She will continue to monitor the patient with her sodium intake and educate her on this and her fluid intake.

## 2019-02-13 ENCOUNTER — Other Ambulatory Visit: Payer: Self-pay | Admitting: Oncology

## 2019-02-14 ENCOUNTER — Encounter: Payer: Self-pay | Admitting: General Surgery

## 2019-03-28 ENCOUNTER — Ambulatory Visit
Admission: RE | Admit: 2019-03-28 | Discharge: 2019-03-28 | Disposition: A | Discharge status: Home / Self Care | Payer: Medicare HMO | Source: Ambulatory Visit | Attending: Radiation Oncology | Admitting: Radiation Oncology

## 2019-03-28 ENCOUNTER — Other Ambulatory Visit: Payer: Self-pay

## 2019-03-28 ENCOUNTER — Encounter: Payer: Self-pay | Admitting: Radiation Oncology

## 2019-03-28 VITALS — BP 116/69 | HR 63 | Temp 97.4°F | Wt 280.4 lb

## 2019-03-28 DIAGNOSIS — Z923 Personal history of irradiation: Secondary | ICD-10-CM | POA: Diagnosis not present

## 2019-03-28 DIAGNOSIS — Z170 Estrogen receptor positive status [ER+]: Secondary | ICD-10-CM | POA: Insufficient documentation

## 2019-03-28 DIAGNOSIS — C50411 Malignant neoplasm of upper-outer quadrant of right female breast: Secondary | ICD-10-CM | POA: Diagnosis present

## 2019-03-28 DIAGNOSIS — Z79811 Long term (current) use of aromatase inhibitors: Secondary | ICD-10-CM | POA: Diagnosis not present

## 2019-03-28 DIAGNOSIS — Z853 Personal history of malignant neoplasm of breast: Secondary | ICD-10-CM | POA: Diagnosis not present

## 2019-03-28 NOTE — Progress Notes (Signed)
Radiation Oncology Follow up Note  Name: [name]   Date:   03/28/2019 MRN:  [id number] DOB: [birth year]    This 73 y.o. female presents to the clinic today for 40-month follow-up status post hypofractionated radiation therapy to her right breast for stage I ER PR positive invasive mammary carcinoma.  REFERRING PROVIDER: Donnie Coffin, MD  HPI: Patient is a 73 year old female now about 6 months having completed hypofractionated course of radiation therapy to her right breast for stage I ER PR positive invasive mammary carcinoma.  She has had a prior history of left breast cancer with radiation to her left breast remotely.  Seen today in routine follow-up she is doing well she specifically denies breast tenderness cough or bone pain..  She is not had a recent mammogram.  She is currently on M arimadex tolerating that well without side effect.  COMPLICATIONS OF TREATMENT: none  FOLLOW UP COMPLIANCE: keeps appointments   PHYSICAL EXAM:  BP 116/69 (BP Location: Left Arm, Patient Position: Sitting)   Pulse 63   Temp (!) 97.4 F (36.3 C) (Tympanic)   Wt 280 lb 6.8 oz (127.2 kg)   BMI 48.13 kg/m  Lungs are clear to A&P cardiac examination essentially unremarkable with regular rate and rhythm. No dominant mass or nodularity is noted in either breast in 2 positions examined. Incision is well-healed. No axillary or supraclavicular adenopathy is appreciated. Cosmetic result is excellent.  Well-developed well-nourished patient in NAD. HEENT reveals PERLA, EOMI, discs not visualized.  Oral cavity is clear. No oral mucosal lesions are identified. Neck is clear without evidence of cervical or supraclavicular adenopathy. Lungs are clear to A&P. Cardiac examination is essentially unremarkable with regular rate and rhythm without murmur rub or thrill. Abdomen is benign with no organomegaly or masses noted. Motor sensory and DTR levels are equal and symmetric in the upper and lower extremities. Cranial  nerves II through XII are grossly intact. Proprioception is intact. No peripheral adenopathy or edema is identified. No motor or sensory levels are noted. Crude visual fields are within normal range.  RADIOLOGY RESULTS: No current films for review  PLAN: Present time patient is doing well with no evidence of disease 6 months out.  I am pleased with her overall progress.  I have asked to see her back in 6 months for follow-up and will start once a year follow-up visits.  She continues on arimadex.  She will be having her mammograms ordered by medical oncology.  Patient knows to call at anytime with any concerns.  I would like to take this opportunity to thank you for allowing me to participate in the care of your patient.[name] Filbert, MD

## 2019-03-29 ENCOUNTER — Encounter: Payer: Self-pay | Admitting: General Surgery

## 2019-03-31 ENCOUNTER — Other Ambulatory Visit: Payer: Self-pay | Admitting: Oncology

## 2019-04-26 ENCOUNTER — Telehealth: Payer: Self-pay | Admitting: *Deleted

## 2019-04-26 NOTE — Telephone Encounter (Signed)
Patient called in requesting help changing her healthcare POA documents. I spoke with Josh and as of right now that's not something that can be completed in clinic or the hospital due to lack of witness availability with Covid. He recommends patient pick up information at next clinic visit and have notary in the community such as the bank notarize documents. Left vm for patient.

## 2019-05-03 ENCOUNTER — Other Ambulatory Visit: Payer: Self-pay

## 2019-05-03 DIAGNOSIS — C50411 Malignant neoplasm of upper-outer quadrant of right female breast: Secondary | ICD-10-CM

## 2019-05-06 ENCOUNTER — Inpatient Hospital Stay: Payer: Medicare HMO | Attending: Oncology | Admitting: Oncology

## 2019-05-21 ENCOUNTER — Other Ambulatory Visit: Payer: Self-pay | Admitting: Oncology

## 2019-05-23 ENCOUNTER — Ambulatory Visit
Admission: RE | Admit: 2019-05-23 | Discharge: 2019-05-23 | Disposition: A | Discharge status: Home / Self Care | Payer: Medicare HMO | Source: Ambulatory Visit | Attending: Surgery | Admitting: Surgery

## 2019-05-23 DIAGNOSIS — Z170 Estrogen receptor positive status [ER+]: Secondary | ICD-10-CM

## 2019-05-23 DIAGNOSIS — C50411 Malignant neoplasm of upper-outer quadrant of right female breast: Secondary | ICD-10-CM

## 2019-05-31 ENCOUNTER — Other Ambulatory Visit: Payer: Self-pay | Admitting: Oncology

## 2019-06-01 ENCOUNTER — Ambulatory Visit: Payer: Medicare HMO | Admitting: Surgery

## 2019-06-14 ENCOUNTER — Ambulatory Visit: Payer: Medicare HMO | Admitting: Surgery

## 2019-06-21 ENCOUNTER — Encounter: Payer: Self-pay | Admitting: Surgery

## 2019-06-21 ENCOUNTER — Other Ambulatory Visit: Payer: Self-pay

## 2019-06-21 ENCOUNTER — Other Ambulatory Visit: Payer: Self-pay | Admitting: Oncology

## 2019-06-21 ENCOUNTER — Ambulatory Visit (INDEPENDENT_AMBULATORY_CARE_PROVIDER_SITE_OTHER): Payer: Medicare HMO | Admitting: Surgery

## 2019-06-21 VITALS — BP 122/79 | HR 79 | Temp 97.7°F | Ht 69.0 in | Wt 292.0 lb

## 2019-06-21 DIAGNOSIS — C50411 Malignant neoplasm of upper-outer quadrant of right female breast: Secondary | ICD-10-CM | POA: Diagnosis not present

## 2019-06-21 DIAGNOSIS — Z170 Estrogen receptor positive status [ER+]: Secondary | ICD-10-CM

## 2019-06-21 NOTE — Patient Instructions (Addendum)
You may apply a moisturizer lotion to the skin. Wear a supportive bra.    Follow up here in 6 months for your exam with Dr Hampton Abbot. We will send you a letter with your appointment at that time.   Continue self breast exams. Call office for any new breast issues or concerns.

## 2019-06-21 NOTE — Progress Notes (Signed)
06/21/2019  History of Present Illness: [name] is a 73 y.o. female with hx of bilateral breast cancer presenting for follow up.  She has history of left breast cancer, s/p lumpectomy and SLNBx in 01/2005.  For this she underwent chemotherapy and radiation.  She is also more recently with history of right breast cancer, s/p lumpectomy and SLNBx in 06/2018.  For this, she only required radiation.  She denies any new findings or symptoms.  She does have some pain and tightness at the right breast, with feeling of heavyness.  Otherwise denies any new masses, other skin changes, or nipple changes.  Past Medical History: Past Medical History:  Diagnosis Date  . Abnormality of gait   . Breast cancer ([place]) 02/05/2005   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   . Breast cancer ([place]) 2019   right breast  . CHF (congestive heart failure) ([place])    March 28, 2017 echo: EF 50-55%; 1) echo 07/2011: EF 20-25%, mild concentric hypertrophy, diffuse HK, regional WMA cannot be excluded, grade 1 DD, mitral valve with mild regurg, LA mildly dilated). 2) EF 40-45%, mild AS,                    . Complication of anesthesia 2006   pt states "hard to wake up" after L breast surgery   . Edema   . GERD (gastroesophageal reflux disease)   . Headache   . Heart murmur   . Hypertension   . Hypokinesis    global, EF 35-45 % from Echo.  . Malignant neoplasm of breast (female), unspecified site 06/14/2018   RIGHT T1b, N0; ER/PR positive, HER-2 negative.  . Neuropathy   . Obesity   . Pain in joint, site unspecified   . Personal history of chemotherapy 2006   left breast  . Personal history of radiation therapy 2019   right breast  . Personal history of radiation therapy 2006   left breast  . Pneumonia   . Sleep apnea   . Tinea pedis   . Unspecified hearing loss    bilateral     Past Surgical History: Past Surgical  History:  Procedure Laterality Date  . BREAST BIOPSY Right 2019   11 oc-INVASIVE MAMMARY CARCINOMA, NO SPECIAL TYPE  . BREAST BIOPSY Right 2019   1030 oc- neg  . BREAST LUMPECTOMY  2006   left breast   . CATARACT EXTRACTION     left eye  . HERNIA REPAIR  05/25/2006   Incarcerated omentum in ventral hernia, Ventralight mesh, combined lap/ open with omental resection.   Marland Kitchen PARTIAL MASTECTOMY WITH NEEDLE LOCALIZATION Right 06/14/2018   Procedure: PARTIAL MASTECTOMY WITH NEEDLE LOCALIZATION;  Surgeon: Robert Bellow, MD;  Location: [facility] ORS;  Service: General;  Laterality: Right;  . PORT-A-CATH REMOVAL  05/25/2006  . SENTINEL NODE BIOPSY Right 06/14/2018   Procedure: SENTINEL NODE BIOPSY;  Surgeon: Robert Bellow, MD;  Location: [facility] ORS;  Service: General;  Laterality: Right;  . TUBAL LIGATION      Home Medications: Prior to Admission medications   Medication Sig Start Date End Date Taking? Authorizing Provider  anastrozole (ARIMIDEX) 1 MG tablet TAKE 1 TABLET BY MOUTH EVERY DAY 05/22/19  Yes Sindy Guadeloupe, MD  aspirin EC 81 MG tablet Take 81 mg by mouth daily.     Yes [provider]  Calcium Carbonate-Vit D-Min (CALCIUM 600+D PLUS MINERALS) 600-400 MG-UNIT CHEW Chew  1 tablet by mouth 2 (two) times daily. 08/20/18  Yes Sindy Guadeloupe, MD  cloNIDine (CATAPRES) 0.2 MG tablet Take 1 tablet (0.2 mg total) by mouth 2 (two) times daily. 06/27/16  Yes Gollan, Kathlene November, MD  clotrimazole (LOTRIMIN) 1 % cream Apply 1 application topically 2 (two) times daily.  02/18/18  Yes [provider]  Incontinence Supply Disposable (BLADDER CONTROL PADS EX ABSORB) [place]  03/22/18  Yes [provider]  losartan (COZAAR) 25 MG tablet Take 25 mg by mouth daily.  05/11/18  Yes [provider]  omeprazole (PRILOSEC) 20 MG capsule Take 20 mg by mouth daily.   Yes [provider]  oxybutynin (DITROPAN) 5 MG tablet Take 5 mg by mouth 2 (two) times daily.   Yes [provider]  potassium chloride SA (K-DUR) 20 MEQ tablet Take 1 tablet (20 meq) by mouth once daily   Yes [provider]  silver sulfADIAZINE (SILVADENE) 1 % cream Apply 1 application topically 2 (two) times daily. 08/17/18  Yes Chrystal, Eulas Post, MD  tiZANidine (ZANAFLEX) 2 MG tablet 1 TAB BY MOUTH 3 TIMES DAILY AS NEEDED MUSCLE SPASM 02/22/19  Yes [provider]  triamcinolone cream (KENALOG) 0.1 %  03/21/19  Yes [provider]  [employer] CALCIUM 600 & VITAMIN D3 600-800 MG-UNIT TABS TAKE 1 TABLET BY MOUTH TWICE A DAY 06/21/19   Sindy Guadeloupe, MD  isosorbide mononitrate (IMDUR) 60 MG 24 hr tablet Take 1 tablet (60 mg total) by mouth 2 (two) times daily. 04/20/13 01/24/19  Minna Merritts, MD  torsemide (DEMADEX) 20 MG tablet Take 1 tablet (20 mg total) by mouth 2 (two) times daily. 12/27/18 04/26/19  Minna Merritts, MD    Allergies: No Known Allergies  Review of Systems: Review of Systems  Constitutional: Negative for chills and fever.  Respiratory: Negative for shortness of breath.   Cardiovascular: Negative for chest pain.  Gastrointestinal: Negative for abdominal pain, nausea and vomiting.  Skin: Negative for rash.    Physical Exam BP 122/79   Pulse 79   Temp 97.7 F (36.5 C)   Ht _0  (1.753 m)   Wt 292 lb (132.5 kg)   SpO2 94%   BMI 43.12 kg/m  CONSTITUTIONAL: No acute distress, but frail with walker. HEENT:  Normocephalic, atraumatic, extraocular motion intact. RESPIRATORY:  Lungs are clear, and breath sounds are equal bilaterally. Normal respiratory effort without pathologic use of accessory muscles. CARDIOVASCULAR: Heart is regular without murmurs, gallops, or rubs. BREAST:  Right breast s/p lumpectomy and with whole breast radiation changes to the skin.  However, no palpable masses, other skin changes, or nipple changes.  No right axillary or supraclavicular lymphadenopathy.  On the left, prior lumpectomy site well healed without any skin  changes, palpable masses, or nipple changes.  No left axillary or supraclavicular lymphadenopathy. GI: The abdomen is soft, non-distended, non-tender. PSYCH:  Alert and oriented to person, place and time. Affect is normal.  Labs/Imaging: Mammogram 05/23/19: FINDINGS: There are lumpectomy changes in the outer right breast and radiation changes of the right breast. Subtle lumpectomy scarring in the upper outer left breast. No mass suspicious microcalcification or nonsurgical distortion is identified in either breast to suggest malignancy.  Mammographic images were processed with CAD.  IMPRESSION: No evidence of malignancy bilaterally.  RECOMMENDATION: Diagnostic mammogram is suggested in 1 year. (Code:DM-B-01Y)  BI-RADS CATEGORY  2: Benign.   Assessment and Plan: This is a 73 y.o. female s/p left breast lumpectomy and  SLNBx in 2006 and right breast lumpectomy and SLNBx in 06/2018.  Patient healing well, without any evidence of recurrence or complications.  Her mammogram was reviewed and discussed with the patient.  She will follow up in 6 months for exam only.  Face-to-face time spent with the patient and care providers was 15 minutes, with more than 50% of the time spent counseling, educating, and coordinating care of the patient.     Melvyn Neth, [facility]

## 2019-06-22 ENCOUNTER — Encounter: Payer: Self-pay | Admitting: Emergency Medicine

## 2019-06-22 ENCOUNTER — Emergency Department: Payer: Medicare HMO

## 2019-06-22 ENCOUNTER — Other Ambulatory Visit: Payer: Self-pay

## 2019-06-22 ENCOUNTER — Emergency Department
Admission: EM | Admit: 2019-06-22 | Discharge: 2019-06-22 | Disposition: A | Discharge status: Home / Self Care | Payer: Medicare HMO | Attending: Emergency Medicine | Admitting: Emergency Medicine

## 2019-06-22 DIAGNOSIS — M25562 Pain in left knee: Secondary | ICD-10-CM | POA: Insufficient documentation

## 2019-06-22 DIAGNOSIS — M7989 Other specified soft tissue disorders: Secondary | ICD-10-CM

## 2019-06-22 DIAGNOSIS — I110 Hypertensive heart disease with heart failure: Secondary | ICD-10-CM | POA: Insufficient documentation

## 2019-06-22 DIAGNOSIS — G8929 Other chronic pain: Secondary | ICD-10-CM

## 2019-06-22 DIAGNOSIS — F17200 Nicotine dependence, unspecified, uncomplicated: Secondary | ICD-10-CM | POA: Insufficient documentation

## 2019-06-22 DIAGNOSIS — Z79899 Other long term (current) drug therapy: Secondary | ICD-10-CM | POA: Insufficient documentation

## 2019-06-22 DIAGNOSIS — I5022 Chronic systolic (congestive) heart failure: Secondary | ICD-10-CM | POA: Insufficient documentation

## 2019-06-22 DIAGNOSIS — Z853 Personal history of malignant neoplasm of breast: Secondary | ICD-10-CM | POA: Diagnosis not present

## 2019-06-22 DIAGNOSIS — M79605 Pain in left leg: Secondary | ICD-10-CM | POA: Diagnosis present

## 2019-06-22 DIAGNOSIS — M79662 Pain in left lower leg: Secondary | ICD-10-CM

## 2019-06-22 MED ORDER — DICLOFENAC SODIUM 1 % TD GEL: 4.0000 g | Freq: Four times a day (QID) | TRANSDERMAL | 0 refills | Status: DC

## 2019-06-22 NOTE — ED Triage Notes (Signed)
Pt here with pain in her left leg that started "a few months ago." Nurse from [place] looked at her leg today and told pt to come here for possible dvt. Denies hx of blood clot. BLE edema noted.

## 2019-06-22 NOTE — ED Notes (Signed)
Patient transported to Ultrasound 

## 2019-06-22 NOTE — ED Provider Notes (Signed)
[facility] Emergency Department Provider Note   ____________________________________________   First MD Initiated Contact with Patient 06/22/19 1532     (approximate)  I have reviewed the triage vital signs and the nursing notes.   HISTORY  Chief Complaint Leg Pain    HPI [name] is a 73 y.o. female history of congestive heart failure, heart murmur hypertension edema   Patient reports she comes for evaluation to make sure she does not have a blood clot in her left leg.  Her home health team evaluated her, and she reports they noticed her leg was a bit swollen on the left and recommended she make sure she did not have a blood clot.  She does report a history of leg swelling and chronic pain requiring injections for arthritis and no disks as she describes it in the left knee.  Denies any injury.  She reports the leg does not appear any more swollen or different than normal for her.  Denies any recent illness.  No redness or pain out of the ordinary left knee pain.  She has had falls in the past but none for about a month and uses a walker at baseline.  No numbness tingling weakness or cold or blue foot.  Past Medical History:  Diagnosis Date  . Abnormality of gait   . Breast cancer ([place]) 02/05/2005   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   . Breast cancer ([place]) 2019   right breast  . CHF (congestive heart failure) ([place])    March 28, 2017 echo: EF 50-55%; 1) echo 07/2011: EF 20-25%, mild concentric hypertrophy, diffuse HK, regional WMA cannot be excluded, grade 1 DD, mitral valve with mild regurg, LA mildly dilated). 2) EF 40-45%, mild AS,                    . Complication of anesthesia 2006   pt states "hard to wake up" after L breast surgery   . Edema   . GERD (gastroesophageal reflux disease)   . Headache   . Heart murmur   . Hypertension   . Hypokinesis    global, EF 35-45 % from Echo.  . Malignant neoplasm of breast (female), unspecified site 06/14/2018   RIGHT T1b, N0; ER/PR positive, HER-2 negative.  . Neuropathy   . Obesity   . Pain in joint, site unspecified   . Personal history of chemotherapy 2006   left breast  . Personal history of radiation therapy 2019   right breast  . Personal history of radiation therapy 2006   left breast  . Pneumonia   . Sleep apnea   . Tinea pedis   . Unspecified hearing loss    bilateral    Patient Active Problem List   Diagnosis Date Noted  . Malignant neoplasm of right female breast ([place]) 05/13/2018  . Venous insufficiency of both lower extremities 11/20/2017  . Acute on chronic diastolic CHF (congestive heart failure) ([place]) 11/20/2017  . Weight gain 11/20/2017  . Localized edema 04/19/2017  . Benign essential HTN 04/19/2017  . OSA (obstructive sleep apnea) 04/19/2017  . Morbid obesity ([place]) 04/19/2017  . TIA (transient ischemic attack) 03/28/2017  . Urinary incontinence 06/27/2016  . Chronic systolic CHF (congestive heart failure) ([place]) 04/20/2013  . HTN (hypertension) 07/16/2011  . CHF (congestive heart failure) ([place]) 07/15/2011  . Shortness of breath 07/15/2011  . Edema 07/15/2011  . Breast cancer ([place])  09/08/2004    Past Surgical History:  Procedure Laterality Date  . BREAST BIOPSY Right 2019   11 oc-INVASIVE MAMMARY CARCINOMA, NO SPECIAL TYPE  . BREAST BIOPSY Right 2019   1030 oc- neg  . BREAST LUMPECTOMY  2006   left breast   . CATARACT EXTRACTION     left eye  . HERNIA REPAIR  05/25/2006   Incarcerated omentum in ventral hernia, Ventralight mesh, combined lap/ open with omental resection.   Marland Kitchen PARTIAL MASTECTOMY WITH NEEDLE LOCALIZATION Right 06/14/2018   Procedure: PARTIAL MASTECTOMY WITH NEEDLE LOCALIZATION;  Surgeon: Robert Bellow, MD;  Location: [facility] ORS;  Service: General;  Laterality: Right;  . PORT-A-CATH REMOVAL  05/25/2006  . SENTINEL NODE BIOPSY Right  06/14/2018   Procedure: SENTINEL NODE BIOPSY;  Surgeon: Robert Bellow, MD;  Location: [facility] ORS;  Service: General;  Laterality: Right;  . TUBAL LIGATION      Prior to Admission medications   Medication Sig Start Date End Date Taking? Authorizing Provider  anastrozole (ARIMIDEX) 1 MG tablet TAKE 1 TABLET BY MOUTH EVERY DAY 05/22/19   Sindy Guadeloupe, MD  aspirin EC 81 MG tablet Take 81 mg by mouth daily.      [provider]  Calcium Carbonate-Vit D-Min (CALCIUM 600+D PLUS MINERALS) 600-400 MG-UNIT CHEW Chew 1 tablet by mouth 2 (two) times daily. 08/20/18   Sindy Guadeloupe, MD  cloNIDine (CATAPRES) 0.2 MG tablet Take 1 tablet (0.2 mg total) by mouth 2 (two) times daily. 06/27/16   Minna Merritts, MD  clotrimazole (LOTRIMIN) 1 % cream Apply 1 application topically 2 (two) times daily.  02/18/18   [provider]  [employer] CALCIUM 600 & VITAMIN D3 600-800 MG-UNIT TABS TAKE 1 TABLET BY MOUTH TWICE A DAY 06/21/19   Sindy Guadeloupe, MD  Incontinence Supply Disposable (BLADDER CONTROL PADS EX ABSORB) [place]  03/22/18   [provider]  isosorbide mononitrate (IMDUR) 60 MG 24 hr tablet Take 1 tablet (60 mg total) by mouth 2 (two) times daily. 04/20/13 01/24/19  Minna Merritts, MD  losartan (COZAAR) 25 MG tablet Take 25 mg by mouth daily.  05/11/18   [provider]  omeprazole (PRILOSEC) 20 MG capsule Take 20 mg by mouth daily.    [provider]  oxybutynin (DITROPAN) 5 MG tablet Take 5 mg by mouth 2 (two) times daily.    [provider]  potassium chloride SA (K-DUR) 20 MEQ tablet Take 1 tablet (20 meq) by mouth once daily    [provider]  silver sulfADIAZINE (SILVADENE) 1 % cream Apply 1 application topically 2 (two) times daily. 08/17/18   [name], MD  tiZANidine (ZANAFLEX) 2 MG tablet 1 TAB BY MOUTH 3 TIMES DAILY AS NEEDED MUSCLE SPASM 02/22/19   [provider]  torsemide (DEMADEX) 20 MG tablet Take 1 tablet (20 mg total)  by mouth 2 (two) times daily. 12/27/18 04/26/19  Minna Merritts, MD  triamcinolone cream (KENALOG) 0.1 %  03/21/19   [provider]    Allergies Patient has no known allergies.  Family History  Problem Relation Age of Onset  . Hypertension Mother        deceased 41  . Cataracts Mother   . Breast cancer Other 68       maternal half-sister; deceased 61  . Coronary artery disease Other     Social History Social History   Tobacco Use  . Smoking status: Former Smoker    Packs/day:  0.50    Years: 30.00    Pack years: 15.00    Types: Cigarettes    Quit date: 07/14/2000    Years since quitting: 18.9  . Smokeless tobacco: Never Used  Substance Use Topics  . Alcohol use: No  . Drug use: Not Currently    Types: Marijuana    Comment: endorses prior marijuana use 1999    Review of Systems Constitutional: No fever/chills or recent illness Eyes: No visual changes. ENT: No sore throat. Cardiovascular: Denies chest pain. Respiratory: Denies shortness of breath. Gastrointestinal: No abdominal pain.   Genitourinary: Negative for dysuria. Musculoskeletal: Negative for back pain. Skin: Negative for rash. Neurological: Negative for headaches, areas of focal weakness or numbness.    ____________________________________________   PHYSICAL EXAM:  VITAL SIGNS: ED Triage Vitals [06/22/19 1447]  Enc Vitals Group     BP 128/73     Pulse Rate 73     Resp 18     Temp 98.8 F (37.1 C)     Temp Source Oral     SpO2 98 %     Weight 292 lb (132.5 kg)     Height _0  (1.753 m)     Head Circumference      Peak Flow      Pain Score 9     Pain Loc      Pain Edu?      Excl. in [place]?     Constitutional: Alert and oriented. Well appearing and in no acute distress.  She is very pleasant, seated at the side of the bed. Eyes: Conjunctivae are normal. Head: Atraumatic. Nose: No congestion/rhinnorhea. Mouth/Throat: Mucous membranes are moist. Neck: No stridor.   Cardiovascular: Normal rate, regular rhythm. Grossly normal heart sounds.  Good peripheral circulation. Respiratory: Normal respiratory effort.  No retractions. Lungs CTAB.  Palpable and well perfused lower extremities bilateral with palpable dorsalis pedis pulses bilateral. Gastrointestinal: Soft and nontender. No distention.  Morbid obesity Musculoskeletal: Somewhat limited mobility of both lower extremities, no deficits noted with good use of muscle groups, but appears generally deconditioned.  She is morbidly obese and demonstrates 1-2+ pitting edema in the lower extremities bilateral, slightly worse in the left leg than the right.  No venous cords or congestion in either extremity.  Normal sensation lower extremities bilateral.  Good toe wiggle and use of the extremities except again she does appear deconditioned but without acute deficit Neurologic:  Normal speech and language. No gross focal neurologic deficits are appreciated.  Skin:  Skin is warm, dry and intact. No rash noted. Psychiatric: Mood and affect are normal. Speech and behavior are normal.  ____________________________________________   LABS (all labs ordered are listed, but only abnormal results are displayed)  Labs Reviewed - No data to display ____________________________________________  EKG   ____________________________________________  RADIOLOGY  US Venous Img Lower Unilateral Left  Result Date: 06/22/2019 CLINICAL DATA:  73 year old female with left lower extremity pain and swelling EXAM: LEFT LOWER EXTREMITY VENOUS DOPPLER ULTRASOUND TECHNIQUE: Gray-scale sonography with graded compression, as well as color Doppler and duplex ultrasound were performed to evaluate the lower extremity deep venous systems from the level of the common femoral vein and including the common femoral, femoral, profunda femoral, popliteal and calf veins including the posterior tibial, peroneal and gastrocnemius veins when visible. The  superficial great saphenous vein was also interrogated. Spectral Doppler was utilized to evaluate flow at rest and with distal augmentation maneuvers in the common femoral, femoral and popliteal veins. COMPARISON:  None. FINDINGS: Contralateral Common Femoral Vein: Respiratory phasicity is normal and symmetric with the symptomatic side. No evidence of thrombus. Normal compressibility. Common Femoral Vein: No evidence of thrombus. Normal compressibility, respiratory phasicity and response to augmentation. Saphenofemoral Junction: No evidence of thrombus. Normal compressibility and flow on color Doppler imaging. Profunda Femoral Vein: No evidence of thrombus. Normal compressibility and flow on color Doppler imaging. Femoral Vein: No evidence of thrombus. Normal compressibility, respiratory phasicity and response to augmentation. Popliteal Vein: No evidence of thrombus. Normal compressibility, respiratory phasicity and response to augmentation. Calf Veins: No evidence of thrombus. Normal compressibility and flow on color Doppler imaging. Superficial Great Saphenous Vein: No evidence of thrombus. Normal compressibility. Venous Reflux:  None. Other Findings:  None. IMPRESSION: No evidence of deep venous thrombosis. Electronically Signed   By: Jacqulynn Cadet M.D.   On: 06/22/2019 16:44     Ultrasound imaging reviewed, negative for DVT. ____________________________________________   PROCEDURES  Procedure(s) performed: None  Procedures  Critical Care performed: No  ____________________________________________   INITIAL IMPRESSION / ASSESSMENT AND PLAN / ED COURSE  Pertinent labs & imaging results that were available during my care of the patient were reviewed by me and considered in my medical decision making (see chart for details).   Patient denies acute change, but reports home health team recommended she come to be sure that she does not have a left leg DVT.  Clinical examination does not  reveal obvious DVT, but with her morbid obesity it certainly would be hard to detect.  He is does appear slightly edematous, but reports this is chronic and denies any acute infectious symptoms have no infectious or acute neurovascular abnormalities on examination except for what appears to be chronic venous stasis changes in both lower legs.  ----------------------------------------- 4:53 PM on 06/22/2019 -----------------------------------------  Ultrasound results reviewed with patient.  Return precautions and treatment recommendations and follow-up discussed with the patient who is agreeable with the plan.  Patient reports she was told to trial by her care team a Voltaren prescription, we discussed use and I will send prescription to pharmacy for her.       ____________________________________________   FINAL CLINICAL IMPRESSION(S) / ED DIAGNOSES  Final diagnoses:  Chronic pain of left knee        Note:  This document was prepared using Dragon voice recognition software and may include unintentional dictation errors       Delman Kitten, MD 06/22/19 1656

## 2019-07-01 ENCOUNTER — Telehealth: Payer: Self-pay | Admitting: Oncology

## 2019-07-01 NOTE — Telephone Encounter (Signed)
LM to try and R/S NS appt-ltg °

## 2019-07-05 ENCOUNTER — Other Ambulatory Visit: Payer: Self-pay | Admitting: Oncology

## 2019-07-23 ENCOUNTER — Other Ambulatory Visit: Payer: Self-pay

## 2019-07-23 ENCOUNTER — Encounter: Payer: Self-pay | Admitting: Emergency Medicine

## 2019-07-23 ENCOUNTER — Emergency Department
Admission: EM | Admit: 2019-07-23 | Discharge: 2019-07-23 | Disposition: A | Discharge status: Home / Self Care | Payer: Medicare HMO | Attending: Emergency Medicine | Admitting: Emergency Medicine

## 2019-07-23 DIAGNOSIS — Z5321 Procedure and treatment not carried out due to patient leaving prior to being seen by health care provider: Secondary | ICD-10-CM | POA: Insufficient documentation

## 2019-07-23 DIAGNOSIS — M79605 Pain in left leg: Secondary | ICD-10-CM | POA: Diagnosis present

## 2019-07-23 NOTE — ED Triage Notes (Signed)
Patient presents to the ED with left leg pain.  Patient states, "I was walking through my bedroom door and my leg gave out".  Patient states this has happened before.  Patient is able to stand and bear weight on leg but only for a few seconds.  Patient states she fell today but did not hit her head or lose consciouness.  Patient states she is having pain in her whole left leg, from her "toes all the way up".  Patient is alert and oriented x 4.

## 2019-07-23 NOTE — ED Triage Notes (Signed)
Pt is here with a fall and past history of falls. Chronic knee pain which pt states she doesn't want knee surgery and the shots don't help. Lives at home with daughter and receives p.t. but is currently unable to ambulate. Pt needs sw consult.

## 2019-07-23 NOTE — ED Notes (Signed)
Patient states her granddaughter is here and she is not waiting to be seen. Patient able to get in wheelchair and it taken to car by granddaughter who takes her home via private car.

## 2019-07-27 ENCOUNTER — Telehealth: Payer: Self-pay | Admitting: *Deleted

## 2019-07-27 NOTE — Telephone Encounter (Signed)
[facility] sent a secure chat that pt missed her appt and needs to be r/s due to she is running out of her AI for breast cancer.  I called pt. And asked if she can come this Friday and she can't. She has to give notice for transportation. I asked pt if she had enough pills to last til after thanksgiving and she said yes. She got 15 pills from surgery office and it has refill of 1 time also. I have made her an appt for 12/3 at 3 pm.pt agreeable. syv

## 2019-08-12 ENCOUNTER — Encounter: Payer: Self-pay | Admitting: Oncology

## 2019-08-12 ENCOUNTER — Other Ambulatory Visit: Payer: Self-pay

## 2019-08-12 ENCOUNTER — Inpatient Hospital Stay: Payer: Medicare HMO | Attending: Oncology | Admitting: Oncology

## 2019-08-12 ENCOUNTER — Ambulatory Visit: Payer: Medicare HMO | Admitting: Oncology

## 2019-08-12 VITALS — BP 147/63 | HR 58 | Temp 97.1°F | Resp 16 | Wt 289.0 lb

## 2019-08-12 DIAGNOSIS — Z08 Encounter for follow-up examination after completed treatment for malignant neoplasm: Secondary | ICD-10-CM | POA: Diagnosis not present

## 2019-08-12 DIAGNOSIS — Z5181 Encounter for therapeutic drug level monitoring: Secondary | ICD-10-CM

## 2019-08-12 DIAGNOSIS — Z170 Estrogen receptor positive status [ER+]: Secondary | ICD-10-CM | POA: Insufficient documentation

## 2019-08-12 DIAGNOSIS — C50411 Malignant neoplasm of upper-outer quadrant of right female breast: Secondary | ICD-10-CM | POA: Diagnosis present

## 2019-08-12 DIAGNOSIS — Z923 Personal history of irradiation: Secondary | ICD-10-CM | POA: Insufficient documentation

## 2019-08-12 DIAGNOSIS — Z79811 Long term (current) use of aromatase inhibitors: Secondary | ICD-10-CM | POA: Insufficient documentation

## 2019-08-12 DIAGNOSIS — Z853 Personal history of malignant neoplasm of breast: Secondary | ICD-10-CM | POA: Diagnosis not present

## 2019-08-12 DIAGNOSIS — I10 Essential (primary) hypertension: Secondary | ICD-10-CM | POA: Diagnosis not present

## 2019-08-12 NOTE — Progress Notes (Signed)
Patient stated that there are times that she gets a pinching sensation on her left breast that lasts seconds and it occurs maybe once a month. Patient denied pain, skin discoloration or nipple discharge.

## 2019-08-14 ENCOUNTER — Other Ambulatory Visit: Payer: Self-pay | Admitting: Oncology

## 2019-08-15 NOTE — Progress Notes (Signed)
Hematology/Oncology Consult note [facility]  Telephone:([phone] [phone]  Patient Care Team: Donnie Coffin, MD as PCP - General (Family Medicine) Minna Merritts, MD as Consulting Physician (Cardiology)   Name of the patient: [name]  [id number]  November 27, 1945   Date of visit: 08/15/19  Diagnosis-stage I right breast cancer  Chief complaint/ Reason for visit-routine follow-up of breast cancer on Arimidex  Heme/Onc history: patient is a 73 year old female with a prior history of left breast cancer in 2006. 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 She has been in remission since then. She has been getting periodic mammograms and the most recent mammogram and ultrasound in August 2019 showed 0.9 x 0.9 x 0.8 cm irregular spiculated mass in the 11 o'clock position of the right breast. There was a concern for an abnormal intramammary lymph node as well. No other abnormal axillary adenopathy noted. Patient underwent ultrasound-guided biopsy of both the right breast mass as well as the axillary lymph node lymph node biopsy was negative for malignancy. Biopsy of the right breast mass showed 6 mm, grade 1 ER PR positive and HER-2/neu negative tumor.  Patient is morbidly obese and has difficulty with ambulation. She uses a cane or a walker to ambulate around the house. She sees Dr. Rockey Situ for her cardiology issues. Reports that her appetite is good and she denies any unintentional weight loss or any new aches or pains anywhere. Family history significant for breast cancer in her sister who died from breast cancer in her 72s. genetic testing was negative  Final pathology showed7 mm grade 2 invasive mammary carcinoma ER PR positive her 2 negative. Negative nodes. Clear margins  Oncotype score came as low risk 8 and she does not require adjuvant chemotherapy She completed adjuvant  radiation and started Arimidex in December 2019    Interval history-patient reports having chronic right leg pain which makes it difficult for her to walk at times.  She is being evaluated by orthopedics for possible back injections.  ECOG PS- 1 Pain scale- 4 Opioid associated constipation- no  Review of systems- Review of Systems  Constitutional: Negative for chills, fever, malaise/fatigue and weight loss.  HENT: Negative for congestion, ear discharge and nosebleeds.   Eyes: Negative for blurred vision.  Respiratory: Negative for cough, hemoptysis, sputum production, shortness of breath and wheezing.   Cardiovascular: Negative for chest pain, palpitations, orthopnea and claudication.  Gastrointestinal: Negative for abdominal pain, blood in stool, constipation, diarrhea, heartburn, melena, nausea and vomiting.  Genitourinary: Negative for dysuria, flank pain, frequency, hematuria and urgency.  Musculoskeletal: Negative for back pain, joint pain and myalgias.       Right leg pain  Skin: Negative for rash.  Neurological: Negative for dizziness, tingling, focal weakness, seizures, weakness and headaches.  Endo/Heme/Allergies: Does not bruise/bleed easily.  Psychiatric/Behavioral: Negative for depression and suicidal ideas. The patient does not have insomnia.        No Known Allergies   Past Medical History:  Diagnosis Date  . Abnormality of gait   . Breast cancer ([place]) 02/05/2005   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   . Breast cancer ([place]) 2019   right breast  . CHF (congestive heart failure) ([place])    March 28, 2017 echo: EF 50-55%; 1) echo 07/2011: EF 20-25%, mild concentric hypertrophy, diffuse HK, regional WMA cannot be excluded, grade 1  DD, mitral valve with mild regurg, LA mildly dilated). 2) EF 40-45%, mild AS,                    . Complication of anesthesia 2006   pt states "hard to  wake up" after L breast surgery   . Edema   . GERD (gastroesophageal reflux disease)   . Headache   . Heart murmur   . Hypertension   . Hypokinesis    global, EF 35-45 % from Echo.  . Malignant neoplasm of breast (female), unspecified site 06/14/2018   RIGHT T1b, N0; ER/PR positive, HER-2 negative.  . Neuropathy   . Obesity   . Pain in joint, site unspecified   . Personal history of chemotherapy 2006   left breast  . Personal history of radiation therapy 2019   right breast  . Personal history of radiation therapy 2006   left breast  . Pneumonia   . Sleep apnea   . Tinea pedis   . Unspecified hearing loss    bilateral     Past Surgical History:  Procedure Laterality Date  . BREAST BIOPSY Right 2019   11 oc-INVASIVE MAMMARY CARCINOMA, NO SPECIAL TYPE  . BREAST BIOPSY Right 2019   1030 oc- neg  . BREAST LUMPECTOMY  2006   left breast   . CATARACT EXTRACTION     left eye  . HERNIA REPAIR  05/25/2006   Incarcerated omentum in ventral hernia, Ventralight mesh, combined lap/ open with omental resection.   Marland Kitchen PARTIAL MASTECTOMY WITH NEEDLE LOCALIZATION Right 06/14/2018   Procedure: PARTIAL MASTECTOMY WITH NEEDLE LOCALIZATION;  Surgeon: Robert Bellow, MD;  Location: [facility] ORS;  Service: General;  Laterality: Right;  . PORT-A-CATH REMOVAL  05/25/2006  . SENTINEL NODE BIOPSY Right 06/14/2018   Procedure: SENTINEL NODE BIOPSY;  Surgeon: Robert Bellow, MD;  Location: [facility] ORS;  Service: General;  Laterality: Right;  . TUBAL LIGATION      Social History   Socioeconomic History  . Marital status: Divorced    Spouse name: Not on file  . Number of children: Not on file  . Years of education: Not on file  . Highest education level: Not on file  Occupational History  . Occupation: retired  Scientific laboratory technician  . Financial resource strain: Not on file  . Food insecurity    Worry: Not on file    Inability: Not on file  . Transportation needs    Medical: Not on file     Non-medical: Not on file  Tobacco Use  . Smoking status: Former Smoker    Packs/day: 0.50    Years: 30.00    Pack years: 15.00    Types: Cigarettes    Quit date: 07/14/2000    Years since quitting: 19.0  . Smokeless tobacco: Never Used  Substance and Sexual Activity  . Alcohol use: No  . Drug use: Not Currently    Types: Marijuana    Comment: endorses prior marijuana use 1999  . Sexual activity: Never  Lifestyle  . Physical activity    Days per week: Not on file    Minutes per session: Not on file  . Stress: Not on file  Relationships  . Social Herbalist on phone: Not on file    Gets together: Not on file    Attends religious service: Not on file    Active member of club or organization: Not on file    Attends meetings of  clubs or organizations: Not on file    Relationship status: Not on file  . Intimate partner violence    Fear of current or ex partner: Not on file    Emotionally abused: Not on file    Physically abused: Not on file    Forced sexual activity: Not on file  Other Topics Concern  . Not on file  Social History Narrative  . Not on file    Family History  Problem Relation Age of Onset  . Hypertension Mother        deceased 3  . Cataracts Mother   . Breast cancer Other 36       maternal half-sister; deceased 52  . Coronary artery disease Other      Current Outpatient Medications:  .  aspirin EC 81 MG tablet, Take 81 mg by mouth daily.  , Disp: , Rfl:  .  Calcium Carbonate-Vit D-Min (CALCIUM 600+D PLUS MINERALS) 600-400 MG-UNIT CHEW, Chew 1 tablet by mouth 2 (two) times daily., Disp: 60 each, Rfl: 6 .  cloNIDine (CATAPRES) 0.2 MG tablet, Take 1 tablet (0.2 mg total) by mouth 2 (two) times daily., Disp: 60 tablet, Rfl: 11 .  clotrimazole (LOTRIMIN) 1 % cream, Apply 1 application topically 2 (two) times daily. , Disp: , Rfl: 1 .  [employer] CALCIUM 600 & VITAMIN D3 600-800 MG-UNIT TABS, TAKE 1 TABLET BY MOUTH TWICE A DAY, Disp: 180 tablet, Rfl: 2 .   diclofenac sodium (VOLTAREN) 1 % GEL, Apply 4 g topically 4 (four) times daily., Disp: 100 g, Rfl: 0 .  Incontinence Supply Disposable (BLADDER CONTROL PADS EX ABSORB) MISC, , Disp: , Rfl:  .  isosorbide mononitrate (IMDUR) 60 MG 24 hr tablet, Take 1 tablet (60 mg total) by mouth 2 (two) times daily., Disp: 180 tablet, Rfl: 3 .  loratadine (CLARITIN) 10 MG tablet, Take 10 mg by mouth daily., Disp: , Rfl:  .  losartan (COZAAR) 25 MG tablet, Take 25 mg by mouth daily. , Disp: , Rfl:  .  omeprazole (PRILOSEC) 20 MG capsule, Take 20 mg by mouth daily., Disp: , Rfl:  .  oxybutynin (DITROPAN) 5 MG tablet, Take 5 mg by mouth 2 (two) times daily., Disp: , Rfl:  .  potassium chloride SA (K-DUR) 20 MEQ tablet, Take 1 tablet (20 meq) by mouth once daily, Disp: , Rfl:  .  silver sulfADIAZINE (SILVADENE) 1 % cream, Apply 1 application topically 2 (two) times daily., Disp: 50 g, Rfl: 0 .  tiZANidine (ZANAFLEX) 2 MG tablet, 1 TAB BY MOUTH 3 TIMES DAILY AS NEEDED MUSCLE SPASM, Disp: , Rfl:  .  torsemide (DEMADEX) 20 MG tablet, Take 1 tablet (20 mg total) by mouth 2 (two) times daily., Disp: 180 tablet, Rfl: 3 .  triamcinolone cream (KENALOG) 0.1 %, , Disp: , Rfl:  .  anastrozole (ARIMIDEX) 1 MG tablet, TK 1 TAB BY MOUTH DAILY. PATIENT MUST MAKE AN APPT TO SEE DR RAO, Disp: 15 tablet, Rfl: 0  Physical exam:  Vitals:   08/12/19 1536  BP: (!) 147/63  Pulse: (!) 58  Resp: 16  Temp: (!) 97.1 F (36.2 C)  TempSrc: Tympanic  SpO2: 100%  Weight: 289 lb (131.1 kg)   Physical Exam Constitutional:      General: She is not in acute distress. HENT:     Head: Normocephalic and atraumatic.  Eyes:     Pupils: Pupils are equal, round, and reactive to light.  Neck:     Musculoskeletal: Normal  range of motion.  Cardiovascular:     Rate and Rhythm: Normal rate and regular rhythm.     Heart sounds: Normal heart sounds.  Pulmonary:     Effort: Pulmonary effort is normal.     Breath sounds: Normal breath sounds.   Abdominal:     General: Bowel sounds are normal.     Palpations: Abdomen is soft.  Skin:    General: Skin is warm and dry.  Neurological:     Mental Status: She is alert and oriented to person, place, and time.      CMP Latest Ref Rng & Units 10/26/2018  Glucose 70 - 99 mg/dL 98  BUN 8 - 23 mg/dL 25(H)  Creatinine 0.44 - 1.00 mg/dL 1.00  Sodium 135 - 145 mmol/L 140  Potassium 3.5 - 5.1 mmol/L 4.5  Chloride 98 - 111 mmol/L 101  CO2 22 - 32 mmol/L 29  Calcium 8.9 - 10.3 mg/dL 9.1  Total Protein 6.5 - 8.1 g/dL 8.3(H)  Total Bilirubin 0.3 - 1.2 mg/dL 0.5  Alkaline Phos 38 - 126 U/L 58  AST 15 - 41 U/L 18  ALT 0 - 44 U/L 14   CBC Latest Ref Rng & Units 06/07/2018  WBC 3.6 - 11.0 K/uL 7.4  Hemoglobin 12.0 - 16.0 g/dL 13.2  Hematocrit 35.0 - 47.0 % 39.9  Platelets 150 - 440 K/uL 258      Assessment and plan- Patient is a 73 y.o. female with a history of invasive mammary, of the right breast in 2018 [id number] ER PR positive HER-2/neu negative status post lumpectomy and adjuvant radiation.  She is currently on Arimidex and here for routine follow-up  Patient continues to tolerate Arimidex well without any significant side effects.  She is also taking calcium and vitamin D.  Breast exam could not be performed today as patient was unable to sit up on the examination table.  Her recent mammogram from September 2020 did not show any evidence of malignancy.  She will need another bone density scan in September 2021.  I will see her back in 6 months with a CBC with differential, CMP   Visit Diagnosis 1. Encounter for follow-up surveillance of breast cancer   2. Visit for monitoring Arimidex therapy      Dr. Randa Evens, MD, MPH [facility] [phone] 08/15/2019 2:07 PM

## 2019-09-03 ENCOUNTER — Other Ambulatory Visit: Payer: Self-pay | Admitting: Oncology

## 2019-09-30 ENCOUNTER — Encounter: Payer: Self-pay | Admitting: Radiation Oncology

## 2019-09-30 ENCOUNTER — Other Ambulatory Visit: Payer: Self-pay

## 2019-09-30 NOTE — Progress Notes (Signed)
Patient pre screened for office appointment, no questions or concerns today. Patient reminded of upcoming appointment time and date. 

## 2019-10-03 ENCOUNTER — Ambulatory Visit
Admission: RE | Admit: 2019-10-03 | Discharge: 2019-10-03 | Disposition: A | Discharge status: Home / Self Care | Payer: Medicare HMO | Source: Ambulatory Visit | Attending: Radiation Oncology | Admitting: Radiation Oncology

## 2019-10-05 ENCOUNTER — Other Ambulatory Visit: Payer: Self-pay | Admitting: Oncology

## 2019-10-13 ENCOUNTER — Encounter: Payer: Self-pay | Admitting: Radiation Oncology

## 2019-10-13 ENCOUNTER — Other Ambulatory Visit: Payer: Self-pay

## 2019-10-13 ENCOUNTER — Ambulatory Visit
Admission: RE | Admit: 2019-10-13 | Discharge: 2019-10-13 | Disposition: A | Discharge status: Home / Self Care | Payer: Medicare HMO | Source: Ambulatory Visit | Attending: Radiation Oncology | Admitting: Radiation Oncology

## 2019-10-13 VITALS — BP 140/75 | HR 57 | Resp 18

## 2019-10-13 DIAGNOSIS — Z923 Personal history of irradiation: Secondary | ICD-10-CM | POA: Insufficient documentation

## 2019-10-13 DIAGNOSIS — C50411 Malignant neoplasm of upper-outer quadrant of right female breast: Secondary | ICD-10-CM

## 2019-10-13 DIAGNOSIS — Z170 Estrogen receptor positive status [ER+]: Secondary | ICD-10-CM | POA: Diagnosis not present

## 2019-10-13 DIAGNOSIS — Z79811 Long term (current) use of aromatase inhibitors: Secondary | ICD-10-CM | POA: Insufficient documentation

## 2019-10-13 NOTE — Progress Notes (Signed)
Radiation Oncology Follow up Note  Name: [name]   Date:   10/13/2019 MRN:  [id number] DOB: May 02, 1946    This 74 y.o. female presents to the clinic today for 1 year follow-up status post hyper fraction radiation therapy to right breast for stage I ER/PR positive invasive mammary carcinoma.  REFERRING PROVIDER: Donnie Coffin, MD  HPI: Patient is a 74 year old female now at 1 year having completed whole breast radiation in a hypofractionated fashion to her right breast for stage I ER/PR positive invasive mammary carcinoma.  Seen today in routine follow-up she is doing well.  She specifically denies breast tenderness cough or bone pain..  She had mammograms last September which I have reviewed were BI-RADS 2 benign.  She is currently on Arimidex tolerating that well without side effect.  COMPLICATIONS OF TREATMENT: none  FOLLOW UP COMPLIANCE: keeps appointments   PHYSICAL EXAM:  BP 140/75 (BP Location: Left Arm, Patient Position: Sitting)   Pulse (!) 57   Resp 18  Lungs are clear to A&P cardiac examination essentially unremarkable with regular rate and rhythm. No dominant mass or nodularity is noted in either breast in 2 positions examined. Incision is well-healed. No axillary or supraclavicular adenopathy is appreciated. Cosmetic result is excellent.  Well-developed well-nourished patient in NAD. HEENT reveals PERLA, EOMI, discs not visualized.  Oral cavity is clear. No oral mucosal lesions are identified. Neck is clear without evidence of cervical or supraclavicular adenopathy. Lungs are clear to A&P. Cardiac examination is essentially unremarkable with regular rate and rhythm without murmur rub or thrill. Abdomen is benign with no organomegaly or masses noted. Motor sensory and DTR levels are equal and symmetric in the upper and lower extremities. Cranial nerves II through XII are grossly intact. Proprioception is intact. No peripheral adenopathy or edema is identified. No motor or  sensory levels are noted. Crude visual fields are within normal range.  RADIOLOGY RESULTS: Mammograms reviewed compatible with above-stated findings  PLAN: Present time she is now 1 year out with no evidence of disease.  I am pleased with her overall progress.  I have asked to see her back in 1 year for follow-up.  She continues on Arimidex without side effect.  Patient knows to call with any concerns.  I would like to take this opportunity to thank you for allowing me to participate in the care of your patient.[name], MD

## 2019-10-17 ENCOUNTER — Ambulatory Visit
Admission: RE | Admit: 2019-10-17 | Discharge: 2019-10-17 | Disposition: A | Discharge status: Home / Self Care | Payer: Medicare HMO | Source: Ambulatory Visit | Attending: Family Medicine | Admitting: Family Medicine

## 2019-10-17 ENCOUNTER — Other Ambulatory Visit: Payer: Self-pay

## 2019-10-17 ENCOUNTER — Other Ambulatory Visit: Payer: Self-pay | Admitting: Family Medicine

## 2019-10-17 DIAGNOSIS — R600 Localized edema: Secondary | ICD-10-CM

## 2019-10-20 ENCOUNTER — Ambulatory Visit: Payer: Medicare HMO

## 2019-11-13 NOTE — Progress Notes (Signed)
Date:  11/14/2019   ID:  [name], DOB 06/10/1946, MRN [id number]  Patient Location:  [address] [place] [address]   Provider location:   [facility], [place] office  PCP:  Donnie Coffin, MD  Cardiologist:  Patsy Baltimore   Chief Complaint  Patient presents with  . OTHER    6 month f/u c/o chest/leg pain and weight gain/fluid retention. Meds reviewed verbally with pt.    History of Present Illness:    [name] is a 74 y.o. female   Patient has a past medical history of Medication confusion/Medication noncompliance No family to help, uses [employer] for transportation morbid obesity,  severe lower extremity edema, dating back to 1914 systolic CHF, ejection fraction 25% in 2012  , Up to EF 50% to 55% in 03/2017 sleep apnea, scheduled for repeat testing. Previous machine broke pulmonary disease,  severe hypertension,  likely secondary to medication noncompliance who presents For follow-up of her hypertension, cardiomyopathy, edema  In follow-up today she presents in wheelchair Reports having some Muscle spasms in her legs , flank on left, chronic issue per previous notes Weight up 10 pounds per year Eats at [facility] brings home food for her, typically high carbohydrates No regular exercise program  Not using her CPAP Previously received a new machine from [place] mask is too small   Continues to take torsemide 20 twice daily Legs continue to be swollen, sits down, legs hanging down  Was never seen for lymphedema Does not have lymphedema compression pumps  EKG personally reviewed by myself on todays visit Shows sinus bradycardia rate 53 bpm nonspecific T wave abnormality 1, aVL, V6  Lab work reviewed creatinine 1.07 BUN 18 potassium 4.3 normal LFTs  Other past medical history reviewed hospitalization July 2018 for TIA and chest pain Slurring, head hurt, H/A, felt hot She had MRI brain without acute  finding Neurology consult with no clear recommendations Chest pain resolved without intervention Carotid ultrasound without significant stenosis SNF was recommended   Prior CV studies:   The following studies were reviewed today:    Past Medical History:  Diagnosis Date  . Abnormality of gait   . 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   . Breast cancer ([place]) 2019   right breast  . CHF (congestive heart failure) ([place])    March 28, 2017 echo: EF 50-55%; 1) echo 07/2011: EF 20-25%, mild concentric hypertrophy, diffuse HK, regional WMA cannot be excluded, grade 1 DD, mitral valve with mild regurg, LA mildly dilated). 2) EF 40-45%, mild AS,                    . Complication of anesthesia 2006   pt states "hard to wake up" after L breast surgery   . Edema   . GERD (gastroesophageal reflux disease)   . Headache   . Heart murmur   . Hypertension   . Hypokinesis    global, EF 35-45 % from Echo.  . Malignant neoplasm of breast (female), unspecified site 06/14/2018   RIGHT T1b, N0; ER/PR positive, HER-2 negative.  . Neuropathy   . Obesity   . Pain in joint, site unspecified   . Personal history of chemotherapy 2006   left breast  . Personal history of radiation therapy 2019   right breast  . Personal history of radiation  therapy 2006   left breast  . Pneumonia   . Sleep apnea   . Tinea pedis   . Unspecified hearing loss    bilateral   Past Surgical History:  Procedure Laterality Date  . BREAST BIOPSY Right 2019   11 oc-INVASIVE MAMMARY CARCINOMA, NO SPECIAL TYPE  . BREAST BIOPSY Right 2019   1030 oc- neg  . BREAST LUMPECTOMY  2006   left breast   . CATARACT EXTRACTION     left eye  . HERNIA REPAIR  05/25/2006   Incarcerated omentum in ventral hernia, Ventralight mesh, combined lap/ open with omental resection.   Marland Kitchen PARTIAL MASTECTOMY WITH NEEDLE  LOCALIZATION Right 06/14/2018   Procedure: PARTIAL MASTECTOMY WITH NEEDLE LOCALIZATION;  Surgeon: Robert Bellow, MD;  Location: [facility] ORS;  Service: General;  Laterality: Right;  . PORT-A-CATH REMOVAL  05/25/2006  . SENTINEL NODE BIOPSY Right 06/14/2018   Procedure: SENTINEL NODE BIOPSY;  Surgeon: Robert Bellow, MD;  Location: [facility] ORS;  Service: General;  Laterality: Right;  . TUBAL LIGATION       Current Meds  Medication Sig  . anastrozole (ARIMIDEX) 1 MG tablet TAKE 1 TABLET BY MOUTH DAILY. PATIENT MUST MAKE AN APPT TO SEE DR RAO  . aspirin EC 81 MG tablet Take 81 mg by mouth daily.    . Calcium Carbonate-Vit D-Min (CALCIUM 600+D PLUS MINERALS) 600-400 MG-UNIT CHEW Chew 1 tablet by mouth 2 (two) times daily.  . cloNIDine (CATAPRES) 0.2 MG tablet Take 1 tablet (0.2 mg total) by mouth 2 (two) times daily.  . clotrimazole (LOTRIMIN) 1 % cream Apply 1 application topically 2 (two) times daily.   . [employer] CALCIUM 600 & VITAMIN D3 600-800 MG-UNIT TABS TAKE 1 TABLET BY MOUTH TWICE A DAY  . diclofenac sodium (VOLTAREN) 1 % GEL Apply 4 g topically 4 (four) times daily.  . Incontinence Supply Disposable (BLADDER CONTROL PADS EX ABSORB) MISC   . isosorbide mononitrate (IMDUR) 60 MG 24 hr tablet Take 1 tablet (60 mg total) by mouth 2 (two) times daily.  Marland Kitchen loratadine (CLARITIN) 10 MG tablet Take 10 mg by mouth daily.  Marland Kitchen losartan (COZAAR) 25 MG tablet Take 25 mg by mouth daily.   Marland Kitchen omeprazole (PRILOSEC) 20 MG capsule Take 20 mg by mouth daily.  Marland Kitchen oxybutynin (DITROPAN) 5 MG tablet Take 5 mg by mouth 2 (two) times daily.  . potassium chloride SA (K-DUR) 20 MEQ tablet Take 1 tablet (20 meq) by mouth once daily  . silver sulfADIAZINE (SILVADENE) 1 % cream Apply 1 application topically 2 (two) times daily.  Marland Kitchen tiZANidine (ZANAFLEX) 2 MG tablet 1 TAB BY MOUTH 3 TIMES DAILY AS NEEDED MUSCLE SPASM  . torsemide (DEMADEX) 20 MG tablet Take 1 tablet (20 mg total) by mouth 2 (two) times daily.  Marland Kitchen  triamcinolone cream (KENALOG) 0.1 %      Allergies:   Patient has no known allergies.   Social History   Tobacco Use  . Smoking status: Former Smoker    Packs/day: 0.50    Years: 30.00    Pack years: 15.00    Types: Cigarettes    Quit date: 07/14/2000    Years since quitting: 19.3  . Smokeless tobacco: Never Used  Substance Use Topics  . Alcohol use: No  . Drug use: Not Currently    Types: Marijuana    Comment: endorses prior marijuana use 1999     Current Outpatient Medications on File Prior to Visit  Medication Sig Dispense  Refill  . anastrozole (ARIMIDEX) 1 MG tablet TAKE 1 TABLET BY MOUTH DAILY. PATIENT MUST MAKE AN APPT TO SEE DR RAO 90 tablet 0  . aspirin EC 81 MG tablet Take 81 mg by mouth daily.      . Calcium Carbonate-Vit D-Min (CALCIUM 600+D PLUS MINERALS) 600-400 MG-UNIT CHEW Chew 1 tablet by mouth 2 (two) times daily. 60 each 6  . cloNIDine (CATAPRES) 0.2 MG tablet Take 1 tablet (0.2 mg total) by mouth 2 (two) times daily. 60 tablet 11  . clotrimazole (LOTRIMIN) 1 % cream Apply 1 application topically 2 (two) times daily.   1  . CVS CALCIUM 600 & VITAMIN D3 600-800 MG-UNIT TABS TAKE 1 TABLET BY MOUTH TWICE A DAY 180 tablet 2  . diclofenac sodium (VOLTAREN) 1 % GEL Apply 4 g topically 4 (four) times daily. 100 g 0  . Incontinence Supply Disposable (BLADDER CONTROL PADS EX ABSORB) MISC     . isosorbide mononitrate (IMDUR) 60 MG 24 hr tablet Take 1 tablet (60 mg total) by mouth 2 (two) times daily. 180 tablet 3  . loratadine (CLARITIN) 10 MG tablet Take 10 mg by mouth daily.    Marland Kitchen losartan (COZAAR) 25 MG tablet Take 25 mg by mouth daily.     Marland Kitchen omeprazole (PRILOSEC) 20 MG capsule Take 20 mg by mouth daily.    Marland Kitchen oxybutynin (DITROPAN) 5 MG tablet Take 5 mg by mouth 2 (two) times daily.    . potassium chloride SA (K-DUR) 20 MEQ tablet Take 1 tablet (20 meq) by mouth once daily    . silver sulfADIAZINE (SILVADENE) 1 % cream Apply 1 application topically 2 (two) times daily.  50 g 0  . tiZANidine (ZANAFLEX) 2 MG tablet 1 TAB BY MOUTH 3 TIMES DAILY AS NEEDED MUSCLE SPASM    . torsemide (DEMADEX) 20 MG tablet Take 1 tablet (20 mg total) by mouth 2 (two) times daily. 180 tablet 3  . triamcinolone cream (KENALOG) 0.1 %     . [DISCONTINUED] anastrozole (ARIMIDEX) 1 MG tablet Take 1 tablet (1 mg total) by mouth daily. Patient MUST make an appointment to see Dr Janese Banks before prescription will be filled again 15 tablet 0   No current facility-administered medications on file prior to visit.     Family Hx: The patient's family history includes Breast cancer (age of onset: 63) in an other family member; Cataracts in her mother; Coronary artery disease in an other family member; Hypertension in her mother.  ROS:   Please see the history of present illness.    Review of Systems  Constitutional: Negative.   HENT: Negative.   Respiratory: Positive for shortness of breath.   Cardiovascular: Positive for leg swelling.  Gastrointestinal: Negative.   Musculoskeletal: Negative.   Neurological: Negative.   Psychiatric/Behavioral: Negative.   All other systems reviewed and are negative.    Labs/Other Tests and Data Reviewed:    Recent Labs: No results found for requested labs within last 8760 hours.   Recent Lipid Panel Lab Results  Component Value Date/Time   CHOL 141 03/28/2017 06:39 AM   TRIG 40 03/28/2017 06:39 AM   HDL 42 03/28/2017 06:39 AM   CHOLHDL 3.4 03/28/2017 06:39 AM   LDLCALC 91 03/28/2017 06:39 AM    Wt Readings from Last 3 Encounters:  11/14/19 (!) 300 lb 4 oz (136.2 kg)  08/12/19 289 lb (131.1 kg)  07/23/19 290 lb (131.5 kg)     Exam:    Vital Signs: Vital  signs may also be detailed in the HPI BP (!) 150/80 (BP Location: Right Arm, Patient Position: Sitting, Cuff Size: Large)   Pulse (!) 53   Ht 5' 9"  (1.753 m)   Wt (!) 300 lb 4 oz (136.2 kg)   SpO2 97%   BMI 44.34 kg/m   Wt Readings from Last 3 Encounters:  11/14/19 (!) 300 lb 4 oz  (136.2 kg)  08/12/19 289 lb (131.1 kg)  07/23/19 290 lb (131.5 kg)   Temp Readings from Last 3 Encounters:  08/12/19 (!) 97.1 F (36.2 C) (Tympanic)  07/23/19 98.4 F (36.9 C) (Oral)   BP Readings from Last 3 Encounters:  11/14/19 (!) 150/80  10/13/19 140/75  08/12/19 (!) 147/63   Pulse Readings from Last 3 Encounters:  11/14/19 (!) 53  10/13/19 (!) 57  08/12/19 (!) 58    Constitutional:  oriented to person, place, and time. No distress.  Obese, presenting in a wheelchair HENT:  Head: Grossly normal Eyes:  no discharge. No scleral icterus.  Neck: No JVD, no carotid bruits  Cardiovascular: Regular rate and rhythm, no murmurs appreciated 1+ lower extremity pitting edema to the mid shins bilaterally Pulmonary/Chest: Clear to auscultation bilaterally, no wheezes or rails Abdominal: Soft.  no distension.  no tenderness.  Musculoskeletal: Normal range of motion Neurological:  normal muscle tone. Coordination normal. No atrophy Skin: Skin warm and dry Psychiatric: normal affect, pleasant   ASSESSMENT & PLAN:    Chronic systolic CHF (congestive heart failure) (H[facility]) Now with chronic diastolic CHF Recommend she stay on torsemide 20 twice daily with potassium, Likely needs extra torsemide 40 twice daily as needed sparingly for worsening leg edema such as today This was discussed with her Recommend she moderate her fluid intake  Morbid obesity ([place]) Weight continues to run high, now 300 pounds Discussed with her, lots of fast food from family Recommend she avoid the fries, sodas  SOB (shortness of breath) Chronic issue secondary to morbid obesity,heart failure , deconditioning Recommend extra torsemide given her leg edema on today's visit  Essential hypertension Blood pressure elevated, suggested she monitor pressure at home, extra torsemide as detailed above  OSA on CPAP Current mask on new machine does not fit her face Has not been using it  Lymphedema We have  previously recommended follow-up with vein and vascular for lymphedema compression pumps  Bedbugs Checking nurse reports seeing bedbugs on her clothing   Total encounter time more than 25 minutes  Greater than 50% was spent in counseling and coordination of care with the patient   Disposition: Follow-up in 6 months   Signed, Ida Rogue, MD  11/14/2019 11:34 AM    [facility] [address], [place], [place] [address]

## 2019-11-14 ENCOUNTER — Encounter: Payer: Self-pay | Admitting: Cardiovascular Disease

## 2019-11-14 ENCOUNTER — Other Ambulatory Visit: Payer: Self-pay

## 2019-11-14 ENCOUNTER — Ambulatory Visit (INDEPENDENT_AMBULATORY_CARE_PROVIDER_SITE_OTHER): Payer: Medicare HMO | Admitting: Cardiovascular Disease

## 2019-11-14 VITALS — BP 150/80 | HR 53 | Ht 69.0 in | Wt 300.2 lb

## 2019-11-14 DIAGNOSIS — I5032 Chronic diastolic (congestive) heart failure: Secondary | ICD-10-CM

## 2019-11-14 DIAGNOSIS — R0602 Shortness of breath: Secondary | ICD-10-CM

## 2019-11-14 DIAGNOSIS — I10 Essential (primary) hypertension: Secondary | ICD-10-CM

## 2019-11-14 DIAGNOSIS — G459 Transient cerebral ischemic attack, unspecified: Secondary | ICD-10-CM | POA: Diagnosis not present

## 2019-11-14 NOTE — Patient Instructions (Signed)

## 2019-11-25 ENCOUNTER — Other Ambulatory Visit: Payer: Self-pay | Admitting: Oncology

## 2019-12-06 ENCOUNTER — Other Ambulatory Visit: Payer: Self-pay | Admitting: Cardiovascular Disease

## 2019-12-23 ENCOUNTER — Ambulatory Visit: Payer: Medicare HMO | Admitting: Surgery

## 2020-01-02 ENCOUNTER — Ambulatory Visit: Payer: Medicare HMO | Admitting: Surgery

## 2020-01-09 ENCOUNTER — Ambulatory Visit: Payer: Medicare HMO | Admitting: Surgery

## 2020-01-13 ENCOUNTER — Ambulatory Visit: Payer: Medicare HMO | Admitting: Surgery

## 2020-01-16 ENCOUNTER — Encounter: Payer: Self-pay | Admitting: Surgery

## 2020-01-16 ENCOUNTER — Ambulatory Visit (INDEPENDENT_AMBULATORY_CARE_PROVIDER_SITE_OTHER): Payer: Medicare HMO | Admitting: Surgery

## 2020-01-16 ENCOUNTER — Other Ambulatory Visit: Payer: Self-pay

## 2020-01-16 VITALS — BP 162/86 | HR 77 | Temp 98.1°F | Resp 15 | Ht 69.0 in | Wt 291.0 lb

## 2020-01-16 DIAGNOSIS — C50411 Malignant neoplasm of upper-outer quadrant of right female breast: Secondary | ICD-10-CM

## 2020-01-16 DIAGNOSIS — Z170 Estrogen receptor positive status [ER+]: Secondary | ICD-10-CM | POA: Diagnosis not present

## 2020-01-16 NOTE — Patient Instructions (Addendum)
Follow up here in 6 months with a bilateral diagnostic mammogram first.  We will schedule this for you and send you a letter with your appointment date and time.   May use heat to the sore muscle area several times a day for comfort. May also use muscle rub like Asper Cream to the area. Try to sit up to alleviate the stress on your muscles.   Continue self breast exams. Call office for any new breast issues or concerns.

## 2020-01-16 NOTE — Progress Notes (Signed)
01/16/2020  History of Present Illness: [name] is a 74 y.o. female status post right breast lumpectomy with sentinel lymph node biopsy in 06/2018 for right breast cancer.  She had radiation but did not require chemotherapy for this.  However she is also status post left breast lumpectomy and sentinel lymph node biopsy in 01/2005 for which she did require chemotherapy and radiation.  Patient presents today for follow-up.  She was last seen on 06/21/2019 and a 11-monthfollow-up was scheduled by she had to reschedule a few times.  Today she reports that she is doing well from the breast standpoint however she does feel soreness over the left pectoralis muscle.  Denies any palpable masses, skin changes, nipple changes.  Past Medical History: Past Medical History:  Diagnosis Date  . Abnormality of gait   . 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   . [place]   right breast  . CHF (congestive heart failure) ([place]    March 28, 2017 echo: EF 50-55%; 1) echo 07/2011: EF 20-25%, mild concentric hypertrophy, diffuse HK, regional WMA cannot be excluded, grade 1 DD, mitral valve with mild regurg, LA mildly dilated). 2) EF 40-45%, mild AS,                    . Complication of anesthesia 2006   pt states "hard to wake up" after L breast surgery   . Edema   . GERD (gastroesophageal reflux disease)   . Headache   . Heart murmur   . Hypertension   . Hypokinesis    global, EF 35-45 % from Echo.  . Malignant neoplasm of breast (female), unspecified site 06/14/2018   RIGHT T1b, N0; ER/PR positive, HER-2 negative.  . Neuropathy   . Obesity   . Pain in joint, site unspecified   . Personal history of chemotherapy 2006   left breast  . Personal history of radiation therapy 2019   right breast  . Personal history of radiation therapy 2006   left breast  . Pneumonia   .  Sleep apnea   . Tinea pedis   . Unspecified hearing loss    bilateral     Past Surgical History: Past Surgical History:  Procedure Laterality Date  . BREAST BIOPSY Right 2019   11 oc-INVASIVE MAMMARY CARCINOMA, NO SPECIAL TYPE  . BREAST BIOPSY Right 2019   1030 oc- neg  . BREAST LUMPECTOMY  2006   left breast   . CATARACT EXTRACTION     left eye  . HERNIA REPAIR  05/25/2006   Incarcerated omentum in ventral hernia, Ventralight mesh, combined lap/ open with omental resection.   .Marland KitchenPARTIAL MASTECTOMY WITH NEEDLE LOCALIZATION Right 06/14/2018   Procedure: PARTIAL MASTECTOMY WITH NEEDLE LOCALIZATION;  Surgeon: BRobert Bellow MD;  Location: [facility] ORS;  Service: General;  Laterality: Right;  . PORT-A-CATH REMOVAL  05/25/2006  . SENTINEL NODE BIOPSY Right 06/14/2018   Procedure: SENTINEL NODE BIOPSY;  Surgeon: BRobert Bellow MD;  Location: [facility] ORS;  Service: General;  Laterality: Right;  . TUBAL LIGATION      Home Medications: Prior to Admission medications   Medication Sig Start Date End Date Taking? Authorizing Provider  anastrozole (ARIMIDEX) 1 MG tablet TAKE 1 TABLET BY MOUTH DAILY. PATIENT MUST MAKE AN APPT TO SEE DR RAO 11/25/19  Yes RSindy Guadeloupe MD  aspirin EC 81 MG tablet  Take 81 mg by mouth daily.     Yes [provider]  cloNIDine (CATAPRES) 0.2 MG tablet Take 1 tablet (0.2 mg total) by mouth 2 (two) times daily. 06/27/16  Yes Gollan, Kathlene November, MD  clotrimazole (LOTRIMIN) 1 % cream Apply 1 application topically 2 (two) times daily.  02/18/18  Yes [provider]  [employer]S CALCIUM 600 & VITAMIN D3 600-800 MG-UNIT TABS TAKE 1 TABLET BY MOUTH TWICE A DAY 06/21/19  Yes Sindy Guadeloupe, MD  diclofenac sodium (VOLTAREN) 1 % GEL Apply 4 g topically 4 (four) times daily. 06/22/19  Yes Delman Kitten, MD  Incontinence Supply Disposable (BLADDER CONTROL PADS EX ABSORB) MISC  03/22/18  Yes [provider]  loratadine (CLARITIN) 10 MG tablet Take 10 mg by mouth  daily. 07/20/19  Yes [provider]  losartan (COZAAR) 25 MG tablet Take 25 mg by mouth daily.  05/11/18  Yes [provider]  omeprazole (PRILOSEC) 20 MG capsule Take 20 mg by mouth daily.   Yes [provider]  oxybutynin (DITROPAN) 5 MG tablet Take 5 mg by mouth 2 (two) times daily.   Yes [provider]  potassium chloride SA (K-DUR) 20 MEQ tablet Take 1 tablet (20 meq) by mouth once daily   Yes [provider]  silver sulfADIAZINE (SILVADENE) 1 % cream Apply 1 application topically 2 (two) times daily. 08/17/18  Yes Chrystal, Eulas Post, MD  tiZANidine (ZANAFLEX) 2 MG tablet 1 TAB BY MOUTH 3 TIMES DAILY AS NEEDED MUSCLE SPASM 02/22/19  Yes [provider]  torsemide (DEMADEX) 20 MG tablet TAKE 1 TABLET BY MOUTH TWICE A DAY 12/06/19  Yes Gollan, Kathlene November, MD  triamcinolone cream (KENALOG) 0.1 %  03/21/19  Yes [provider]  isosorbide mononitrate (IMDUR) 60 MG 24 hr tablet Take 1 tablet (60 mg total) by mouth 2 (two) times daily. 04/20/13 11/14/19  Minna Merritts, MD  anastrozole (ARIMIDEX) 1 MG tablet Take 1 tablet (1 mg total) by mouth daily. Patient MUST make an appointment to see Dr Janese Banks before prescription will be filled again 07/06/19   Sindy Guadeloupe, MD    Allergies: No Known Allergies  Review of Systems: Review of Systems  Constitutional: Negative for chills and fever.  Respiratory: Negative for shortness of breath.   Cardiovascular: Negative for chest pain.  Gastrointestinal: Negative for abdominal pain, nausea and vomiting.  Musculoskeletal: Positive for myalgias.  Skin: Negative for rash.    Physical Exam BP (!) 162/86   Pulse 77   Temp 98.1 F (36.7 C)   Resp 15   Ht _0  (1.753 m)   Wt 291 lb (132 kg)   SpO2 95%   BMI 42.97 kg/m  CONSTITUTIONAL: No acute distress HEENT:  Normocephalic, atraumatic, extraocular motion intact. RESPIRATORY:  Lungs are clear, and breath sounds are equal bilaterally. Normal  respiratory effort without pathologic use of accessory muscles. CARDIOVASCULAR: Heart is regular without murmurs, gallops, or rubs. BREAST: Right breast status post lumpectomy in the upper outer quadrant as well as sentinel lymph node biopsy.  Radiation changes to the right breast skin are still visible.  Incisions are well-healed.  There are no palpable masses, other skin changes, or nipple changes/drainage.  There is no right axillary lymphadenopathy.  Left breast is status post lumpectomy and sentinel lymph node biopsy.  Incisions are well-healed.  There are no palpable masses, skin changes, or nipple changes/drainage.  There is reproducible soreness at the corner of the pectoralis muscle as  it approaches the shoulder.  There are no palpable masses in this area. NEUROLOGIC:  Motor and sensation is grossly normal.  Cranial nerves are grossly intact. PSYCH:  Alert and oriented to person, place and time. Affect is normal.  Labs/Imaging: None recently  Assessment and Plan: This is a 74 y.o. female status post right breast lumpectomy and sentinel lymph node biopsy in 06/2018.  -Discussed with her again her mammography findings from September 2020 which did not show any suspicious findings.  Today's exam is reassuring and I think the soreness of the left pectoralis is more related to posture as the patient is very hunched over during the exam today.  Discussed with her about trying to sit up better with a straighter posture as well as using heating pads over the pectoralis muscle to help relax it. -Patient will follow-up towards the end of September with a repeat bilateral diagnostic mammogram and physical exam.  Face-to-face time spent with the patient and care providers was 15 minutes, with more than 50% of the time spent counseling, educating, and coordinating care of the patient.     Melvyn Neth, [facility]

## 2020-02-09 ENCOUNTER — Other Ambulatory Visit: Payer: Medicare HMO

## 2020-02-10 ENCOUNTER — Inpatient Hospital Stay: Payer: Medicare HMO | Attending: Oncology | Admitting: Oncology

## 2020-02-10 ENCOUNTER — Encounter: Payer: Self-pay | Admitting: Oncology

## 2020-02-10 ENCOUNTER — Other Ambulatory Visit: Payer: Self-pay

## 2020-02-10 ENCOUNTER — Inpatient Hospital Stay: Payer: Medicare HMO

## 2020-02-10 DIAGNOSIS — Z79899 Other long term (current) drug therapy: Secondary | ICD-10-CM | POA: Insufficient documentation

## 2020-02-10 DIAGNOSIS — C50911 Malignant neoplasm of unspecified site of right female breast: Secondary | ICD-10-CM | POA: Insufficient documentation

## 2020-02-10 DIAGNOSIS — Z5181 Encounter for therapeutic drug level monitoring: Secondary | ICD-10-CM | POA: Diagnosis not present

## 2020-02-10 DIAGNOSIS — I110 Hypertensive heart disease with heart failure: Secondary | ICD-10-CM | POA: Diagnosis not present

## 2020-02-10 DIAGNOSIS — Z853 Personal history of malignant neoplasm of breast: Secondary | ICD-10-CM | POA: Diagnosis not present

## 2020-02-10 DIAGNOSIS — Z08 Encounter for follow-up examination after completed treatment for malignant neoplasm: Secondary | ICD-10-CM

## 2020-02-10 DIAGNOSIS — Z87891 Personal history of nicotine dependence: Secondary | ICD-10-CM | POA: Diagnosis not present

## 2020-02-10 DIAGNOSIS — Z79811 Long term (current) use of aromatase inhibitors: Secondary | ICD-10-CM

## 2020-02-10 DIAGNOSIS — Z170 Estrogen receptor positive status [ER+]: Secondary | ICD-10-CM | POA: Insufficient documentation

## 2020-02-10 DIAGNOSIS — I509 Heart failure, unspecified: Secondary | ICD-10-CM | POA: Diagnosis not present

## 2020-02-10 DIAGNOSIS — Z923 Personal history of irradiation: Secondary | ICD-10-CM | POA: Insufficient documentation

## 2020-02-10 LAB — CBC WITH DIFFERENTIAL/PLATELET
Abs Immature Granulocytes: 0.04 10*3/uL (ref 0.00–0.07)
Basophils Absolute: 0 10*3/uL (ref 0.0–0.1)
Basophils Relative: 0 %
Eosinophils Absolute: 0.1 10*3/uL (ref 0.0–0.5)
Eosinophils Relative: 1 %
HCT: 37.7 % (ref 36.0–46.0)
Hemoglobin: 12.4 g/dL (ref 12.0–15.0)
Immature Granulocytes: 1 %
Lymphocytes Relative: 17 %
Lymphs Abs: 1.3 10*3/uL (ref 0.7–4.0)
MCH: 27.7 pg (ref 26.0–34.0)
MCHC: 32.9 g/dL (ref 30.0–36.0)
MCV: 84.3 fL (ref 80.0–100.0)
Monocytes Absolute: 0.6 10*3/uL (ref 0.1–1.0)
Monocytes Relative: 8 %
Neutro Abs: 5.4 10*3/uL (ref 1.7–7.7)
Neutrophils Relative %: 73 %
Platelets: 327 10*3/uL (ref 150–400)
RBC: 4.47 MIL/uL (ref 3.87–5.11)
RDW: 13.1 % (ref 11.5–15.5)
WBC: 7.4 10*3/uL (ref 4.0–10.5)
nRBC: 0 % (ref 0.0–0.2)

## 2020-02-10 LAB — COMPREHENSIVE METABOLIC PANEL
ALT: 12 U/L (ref 0–44)
AST: 17 U/L (ref 15–41)
Albumin: 3.8 g/dL (ref 3.5–5.0)
Alkaline Phosphatase: 63 U/L (ref 38–126)
Anion gap: 12 (ref 5–15)
BUN: 24 mg/dL — ABNORMAL HIGH (ref 8–23)
CO2: 27 mmol/L (ref 22–32)
Calcium: 9 mg/dL (ref 8.9–10.3)
Chloride: 100 mmol/L (ref 98–111)
Creatinine, Ser: 1.41 mg/dL — ABNORMAL HIGH (ref 0.44–1.00)
GFR calc Af Amer: 42 mL/min — ABNORMAL LOW (ref >60)
GFR calc non Af Amer: 37 mL/min — ABNORMAL LOW (ref >60)
Glucose, Bld: 114 mg/dL — ABNORMAL HIGH (ref 70–99)
Potassium: 4.4 mmol/L (ref 3.5–5.1)
Sodium: 139 mmol/L (ref 135–145)
Total Bilirubin: 0.6 mg/dL (ref 0.3–1.2)
Total Protein: 8.2 g/dL — ABNORMAL HIGH (ref 6.5–8.1)

## 2020-02-10 NOTE — Progress Notes (Signed)
Pt states she has a hole in her skin on left leg and went to PCP and they gave her a cream but it needs more attention than that. I will call PCP office and check. She did have issues in thepast and had to go to wound care.  She also states that she has pain in right side at upper belly and radiates to her pelvis and rest of abdomen. No breast concerns

## 2020-02-13 NOTE — Progress Notes (Signed)
Hematology/Oncology Consult note [facility]  [phone] [phone]  Patient Care Team: Donnie Coffin, MD as PCP - General (Family Medicine) Minna Merritts, MD as Consulting Physician (Cardiology)   Name of the patient: [name]  [id number]  07/07/1946   Date of visit: 02/13/20  Diagnosis-stage I right breast cancer  Chief complaint/ Reason for visit-routine follow-up of breast cancer  Heme/Onc history: patient is a 74 year old female with a prior history of left breast cancer in 2006. 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 She has been in remission since then. She has been getting periodic mammograms and the most recent mammogram and ultrasound in August 2019 showed 0.9 x 0.9 x 0.8 cm irregular spiculated mass in the 11 o'clock position of the right breast. There was a concern for an abnormal intramammary lymph node as well. No other abnormal axillary adenopathy noted. Patient underwent ultrasound-guided biopsy of both the right breast mass as well as the axillary lymph node lymph node biopsy was negative for malignancy. Biopsy of the right breast mass showed 6 mm, grade 1 ER PR positive and HER-2/neu negative tumor.  Patient is morbidly obese and has difficulty with ambulation. She uses a cane or a walker to ambulate around the house. She sees Dr. Rockey Situ for her cardiology issues. Reports that her appetite is good and she denies any unintentional weight loss or any new aches or pains anywhere. Family history significant for breast cancer in her sister who died from breast cancer in her 69s. genetic testing was negative  Final pathology showed7 mm grade 2 invasive mammary carcinoma ER PR positive her 2 negative. Negative nodes. Clear margins  Oncotype score came as low risk 8 and she does not require adjuvant chemotherapy She completed adjuvant radiation  and started Arimidex in December 2019   Interval history- she has chronic health issues including b/l LE swelling and ongoing fatigue. She has been complaint with arimidex  ECOG PS- 2 Pain scale- 0   Review of systems- Review of Systems  Constitutional: Positive for malaise/fatigue. Negative for chills, fever and weight loss.  HENT: Negative for congestion, ear discharge and nosebleeds.   Eyes: Negative for blurred vision.  Respiratory: Negative for cough, hemoptysis, sputum production, shortness of breath and wheezing.   Cardiovascular: Positive for leg swelling. Negative for chest pain, palpitations, orthopnea and claudication.  Gastrointestinal: Negative for abdominal pain, blood in stool, constipation, diarrhea, heartburn, melena, nausea and vomiting.  Genitourinary: Negative for dysuria, flank pain, frequency, hematuria and urgency.  Musculoskeletal: Negative for back pain, joint pain and myalgias.  Skin: Negative for rash.  Neurological: Negative for dizziness, tingling, focal weakness, seizures, weakness and headaches.  Endo/Heme/Allergies: Does not bruise/bleed easily.  Psychiatric/Behavioral: Negative for depression and suicidal ideas. The patient does not have insomnia.       No Known Allergies   Past Medical History:  Diagnosis Date  . Abnormality of gait   . Breast cancer ([place]) 02/05/2005   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   . Breast cancer ([place]) 2019   right breast  . CHF (congestive heart failure) ([place])    March 28, 2017 echo: EF 50-55%; 1) echo 07/2011: EF 20-25%, mild concentric hypertrophy, diffuse HK, regional WMA cannot be excluded, grade 1 DD, mitral valve with mild regurg, LA mildly dilated). 2) EF 40-45%, mild AS,                    .  Complication of anesthesia 2006   pt states "hard to wake up" after L breast surgery   . Edema   . GERD (gastroesophageal reflux  disease)   . Headache   . Heart murmur   . Hypertension   . Hypokinesis    global, EF 35-45 % from Echo.  . Malignant neoplasm of breast (female), unspecified site 06/14/2018   RIGHT T1b, N0; ER/PR positive, HER-2 negative.  . Neuropathy   . Obesity   . Pain in joint, site unspecified   . Personal history of chemotherapy 2006   left breast  . Personal history of radiation therapy 2019   right breast  . Personal history of radiation therapy 2006   left breast  . Pneumonia   . Sleep apnea   . Tinea pedis   . Unspecified hearing loss    bilateral     Past Surgical History:  Procedure Laterality Date  . BREAST BIOPSY Right 2019   11 oc-INVASIVE MAMMARY CARCINOMA, NO SPECIAL TYPE  . BREAST BIOPSY Right 2019   1030 oc- neg  . BREAST LUMPECTOMY  2006   left breast   . CATARACT EXTRACTION     left eye  . HERNIA REPAIR  05/25/2006   Incarcerated omentum in ventral hernia, Ventralight mesh, combined lap/ open with omental resection.   Marland Kitchen PARTIAL MASTECTOMY WITH NEEDLE LOCALIZATION Right 06/14/2018   Procedure: PARTIAL MASTECTOMY WITH NEEDLE LOCALIZATION;  Surgeon: Robert Bellow, MD;  Location: [facility] ORS;  Service: General;  Laterality: Right;  . PORT-A-CATH REMOVAL  05/25/2006  . SENTINEL NODE BIOPSY Right 06/14/2018   Procedure: SENTINEL NODE BIOPSY;  Surgeon: Robert Bellow, MD;  Location: [facility] ORS;  Service: General;  Laterality: Right;  . TUBAL LIGATION      Social History   Socioeconomic History  . Marital status: Divorced    Spouse name: Not on file  . Number of children: Not on file  . Years of education: Not on file  . Highest education level: Not on file  Occupational History  . Occupation: retired  Tobacco Use  . Smoking status: Former Smoker    Packs/day: 0.50    Years: 30.00    Pack years: 15.00    Types: Cigarettes    Quit date: 07/14/2000    Years since quitting: 19.5  . Smokeless tobacco: Never Used  Substance and Sexual Activity  . Alcohol  use: No  . Drug use: Not Currently    Types: Marijuana    Comment: endorses prior marijuana use 1999  . Sexual activity: Never  Other Topics Concern  . Not on file  Social History Narrative  . Not on file   Social Determinants of Health   Financial Resource Strain:   . Difficulty of Paying Living Expenses:   Food Insecurity:   . Worried About Charity fundraiser in the Last Year:   . Arboriculturist in the Last Year:   Transportation Needs:   . Film/video editor (Medical):   Marland Kitchen Lack of Transportation (Non-Medical):   Physical Activity:   . Days of Exercise per Week:   . Minutes of Exercise per Session:   Stress:   . Feeling of Stress :   Social Connections:   . Frequency of Communication with Friends and Family:   . Frequency of Social Gatherings with Friends and Family:   . Attends Religious Services:   . Active Member of Clubs or Organizations:   . Attends Archivist Meetings:   .  Marital Status:   Intimate Partner Violence:   . Fear of Current or Ex-Partner:   . Emotionally Abused:   Marland Kitchen Physically Abused:   . Sexually Abused:     Family History  Problem Relation Age of Onset  . Hypertension Mother        deceased 6  . Cataracts Mother   . Breast cancer Other 33       maternal half-sister; deceased 79  . Coronary artery disease Other      Current Outpatient Medications:  .  anastrozole (ARIMIDEX) 1 MG tablet, TAKE 1 TABLET BY MOUTH DAILY. PATIENT MUST MAKE AN APPT TO SEE DR Taitum Alms, Disp: 90 tablet, Rfl: 0 .  cloNIDine (CATAPRES) 0.2 MG tablet, Take 1 tablet (0.2 mg total) by mouth 2 (two) times daily., Disp: 60 tablet, Rfl: 11 .  clotrimazole (LOTRIMIN) 1 % cream, Apply 1 application topically 2 (two) times daily. , Disp: , Rfl: 1 .  [employer] CALCIUM 600 & VITAMIN D3 600-800 MG-UNIT TABS, TAKE 1 TABLET BY MOUTH TWICE A DAY, Disp: 180 tablet, Rfl: 2 .  diclofenac sodium (VOLTAREN) 1 % GEL, Apply 4 g topically 4 (four) times daily., Disp: 100 g, Rfl: 0 .   Incontinence Supply Disposable (BLADDER CONTROL PADS EX ABSORB) MISC, , Disp: , Rfl:  .  isosorbide mononitrate (IMDUR) 60 MG 24 hr tablet, Take 1 tablet (60 mg total) by mouth 2 (two) times daily., Disp: 180 tablet, Rfl: 3 .  loratadine (CLARITIN) 10 MG tablet, Take 10 mg by mouth daily., Disp: , Rfl:  .  losartan (COZAAR) 25 MG tablet, Take 25 mg by mouth daily. , Disp: , Rfl:  .  omeprazole (PRILOSEC) 20 MG capsule, Take 20 mg by mouth daily., Disp: , Rfl:  .  oxybutynin (DITROPAN) 5 MG tablet, Take 5 mg by mouth 2 (two) times daily., Disp: , Rfl:  .  potassium chloride SA (K-DUR) 20 MEQ tablet, Take 1 tablet (20 meq) by mouth once daily, Disp: , Rfl:  .  torsemide (DEMADEX) 20 MG tablet, TAKE 1 TABLET BY MOUTH TWICE A DAY, Disp: 180 tablet, Rfl: 3 .  triamcinolone cream (KENALOG) 0.1 %, Apply 1 application topically 2 (two) times daily. Back of legs, Disp: , Rfl:  .  aspirin EC 81 MG tablet, Take 81 mg by mouth daily.  , Disp: , Rfl:  .  tiZANidine (ZANAFLEX) 2 MG tablet, 1 TAB BY MOUTH 3 TIMES DAILY AS NEEDED MUSCLE SPASM, Disp: , Rfl:   Physical exam: There were no vitals filed for this visit. Physical Exam Constitutional:      Comments: Ambulates with a walker  Cardiovascular:     Rate and Rhythm: Normal rate and regular rhythm.     Heart sounds: Normal heart sounds.  Pulmonary:     Effort: Pulmonary effort is normal.     Breath sounds: Normal breath sounds.  Abdominal:     General: Bowel sounds are normal.     Palpations: Abdomen is soft.  Musculoskeletal:     Right lower leg: Edema present.     Left lower leg: Edema present.  Skin:    General: Skin is warm and dry.  Neurological:     Mental Status: She is alert and oriented to person, place, and time.      CMP Latest Ref Rng & Units 02/10/2020  Glucose 70 - 99 mg/dL 114(H)  BUN 8 - 23 mg/dL 24(H)  Creatinine 0.44 - 1.00 mg/dL 1.41(H)  Sodium 135 -  145 mmol/L 139  Potassium 3.5 - 5.1 mmol/L 4.4  Chloride 98 - 111 mmol/L  100  CO2 22 - 32 mmol/L 27  Calcium 8.9 - 10.3 mg/dL 9.0  Total Protein 6.5 - 8.1 g/dL 8.2(H)  Total Bilirubin 0.3 - 1.2 mg/dL 0.6  Alkaline Phos 38 - 126 U/L 63  AST 15 - 41 U/L 17  ALT 0 - 44 U/L 12   CBC Latest Ref Rng & Units 02/10/2020  WBC 4.0 - 10.5 K/uL 7.4  Hemoglobin 12.0 - 15.0 g/dL 12.4  Hematocrit 36.0 - 46.0 % 37.7  Platelets 150 - 400 K/uL 327     Assessment and plan- Patient is a 74 y.o. female with a history of invasive mammary, of the right breast in [id number] ER PR positive HER-2/neu negative status post lumpectomy and adjuvant radiation  Patient has multiple comorbidities but overall tolerating Arimidex well without any significant side effects.  I am still unable to perform a proper breast exam because she cannot sit up on the examination table.  Limited breast exam performed and did not reveal any palpable masses.  She will proceed with her diagnostic mammogram as well as bone density in September 2021.  Her baseline bone density scan was normal.    Visit Diagnosis 1. Encounter for follow-up surveillance of breast cancer   2. Visit for monitoring Arimidex therapy   3. High risk medication use      Dr. Randa Evens, MD, MPH [facility] [phone] 02/13/2020 3:07 PM

## 2020-02-15 ENCOUNTER — Other Ambulatory Visit: Payer: Self-pay | Admitting: Oncology

## 2020-03-06 ENCOUNTER — Encounter: Payer: Medicare HMO | Attending: Physician Assistant | Admitting: Physician Assistant

## 2020-03-06 ENCOUNTER — Other Ambulatory Visit: Payer: Self-pay

## 2020-03-06 DIAGNOSIS — I10 Essential (primary) hypertension: Secondary | ICD-10-CM | POA: Insufficient documentation

## 2020-03-06 DIAGNOSIS — M1990 Unspecified osteoarthritis, unspecified site: Secondary | ICD-10-CM | POA: Diagnosis not present

## 2020-03-06 DIAGNOSIS — L98499 Non-pressure chronic ulcer of skin of other sites with unspecified severity: Secondary | ICD-10-CM | POA: Insufficient documentation

## 2020-03-06 DIAGNOSIS — E11622 Type 2 diabetes mellitus with other skin ulcer: Secondary | ICD-10-CM | POA: Diagnosis not present

## 2020-03-06 DIAGNOSIS — E1140 Type 2 diabetes mellitus with diabetic neuropathy, unspecified: Secondary | ICD-10-CM | POA: Insufficient documentation

## 2020-03-07 NOTE — Progress Notes (Signed)
[name], [name] ([id number]) Visit Report for 03/06/2020 Allergy List Details Patient Name: [name], [name] Date of Service: 03/06/2020 12:45 PM Medical Record Number: [id number] Patient Account Number: [id number] Date of Birth/Sex: March 03, 1946 (74 y.o. F) Treating RN: Cornell Barman Primary Care Jonie Burdell: [name] Other Clinician: Referring Yenty Bloch: [name] Treating Gareth Fitzner/Extender: Melburn Hake, HOYT Weeks in Treatment: 0 Allergies Active Allergies No Known Drug Allergies Allergy Notes Electronic Signature(s) Signed: 03/06/2020 5:48:04 PM By: Gretta Cool, BSN, RN, CWS, Kim RN, BSN Entered By: Gretta Cool, BSN, RN, CWS, Kim on 03/06/2020 13:32:54 [name] ([id number]) -------------------------------------------------------------------------------- Arrival Information Details Patient Name: [name] Date of Service: 03/06/2020 12:45 PM Medical Record Number: [id number] Patient Account Number: [id number] Date of Birth/Sex: [birth year] (74 y.o. F) Treating RN: Cornell Barman Primary Care Oluwademilade Mckiver: [name] Other Clinician: Referring Eternity Dexter: [name] Treating Shirle Provencal/Extender: Melburn Hake, HOYT Weeks in Treatment: 0 Visit Information Patient Arrived: Walker Arrival Time: 13:13 Accompanied By: self Transfer Assistance: Manual Patient Identification Verified: Yes Secondary Verification Process Completed: Yes Electronic Signature(s) Signed: 03/06/2020 4:50:25 PM By: Lorine Bears RCP, RRT, CHT Entered By: Lorine Bears on 03/06/2020 13:14:15 [name] ([id number]) -------------------------------------------------------------------------------- Clinic Level of Care Assessment Details Patient Name: [name] Date of Service: 03/06/2020 12:45 PM Medical Record Number: [id number] Patient Account Number: [id number] Date of Birth/Sex: [birth year] (74 y.o. F) Treating RN: Cornell Barman Primary Care Miah Boye: [name] Other  Clinician: Referring Marquavion Venhuizen: [name] Treating Nollan Muldrow/Extender: Melburn Hake, HOYT Weeks in Treatment: 0 Clinic Level of Care Assessment Items TOOL 1 Quantity Score []  - Use when EandM and Procedure is performed on INITIAL visit 0 ASSESSMENTS - Nursing Assessment / Reassessment X - General Physical Exam (combine w/ comprehensive assessment (listed just below) when performed on new 1 20 pt. evals) X- 1 25 Comprehensive Assessment (HX, ROS, Risk Assessments, Wounds Hx, etc.) ASSESSMENTS - Wound and Skin Assessment / Reassessment []  - Dermatologic / Skin Assessment (not related to wound area) 0 ASSESSMENTS - Ostomy and/or Continence Assessment and Care []  - Incontinence Assessment and Management 0 []  - 0 Ostomy Care Assessment and Management (repouching, etc.) PROCESS - Coordination of Care X - Simple Patient / Family Education for ongoing care 1 15 []  - 0 Complex (extensive) Patient / Family Education for ongoing care X- 1 10 Staff obtains Programmer, systems, Records, Test Results / Process Orders []  - 0 Staff telephones HHA, Nursing Homes / Clarify orders / etc []  - 0 Routine Transfer to another Facility (non-emergent condition) []  - 0 Routine Hospital Admission (non-emergent condition) X- 1 15 New Admissions / Biomedical engineer / Ordering NPWT, Apligraf, etc. []  - 0 Emergency Hospital Admission (emergent condition) PROCESS - Special Needs []  - Pediatric / Minor Patient Management 0 []  - 0 Isolation Patient Management []  - 0 Hearing / Language / Visual special needs []  - 0 Assessment of Community assistance (transportation, D/C planning, etc.) []  - 0 Additional assistance / Altered mentation []  - 0 Support Surface(s) Assessment (bed, cushion, seat, etc.) INTERVENTIONS - Miscellaneous []  - External ear exam 0 []  - 0 Patient Transfer (multiple staff / Civil Service fast streamer / Similar devices) []  - 0 Simple Staple / Suture removal (25 or less) []  - 0 Complex Staple / Suture  removal (26 or more) []  - 0 Hypo/Hyperglycemic Management (do not check if billed separately) X- 1 15 Ankle / Brachial Index (ABI) - do not check if billed separately Has the patient been seen at the hospital within the last three years: Yes Total Score: 100 Level Of Care: New/Established -  Level 3 [name], [name] ([id number]) Electronic Signature(s) Signed: 03/06/2020 5:48:04 PM By: Gretta Cool, BSN, RN, CWS, Kim RN, BSN Entered By: Gretta Cool, BSN, RN, CWS, Kim on 03/06/2020 16:53:26 [name] ([id number]) -------------------------------------------------------------------------------- Encounter Discharge Information Details Patient Name: [name] Date of Service: 03/06/2020 12:45 PM Medical Record Number: [id number] Patient Account Number: [id number] Date of Birth/Sex: 1946-04-16 (74 y.o. F) Treating RN: Cornell Barman Primary Care Maelyn Berrey: [name] Other Clinician: Referring Delynn Pursley: [name] Treating Janeshia Ciliberto/Extender: Melburn Hake, HOYT Weeks in Treatment: 0 Encounter Discharge Information Items Post Procedure Vitals Discharge Condition: Stable Temperature (F): 98.2 Ambulatory Status: Walker Pulse (bpm): 53 Discharge Destination: Home Respiratory Rate (breaths/min): 18 Transportation: Private Auto Blood Pressure (mmHg): 135/45 Accompanied By: self Schedule Follow-up Appointment: Yes Clinical Summary of Care: Electronic Signature(s) Signed: 03/06/2020 4:56:54 PM By: Gretta Cool, BSN, RN, CWS, Kim RN, BSN Entered By: Gretta Cool, BSN, RN, CWS, Kim on 03/06/2020 16:56:54 [name] ([id number]) -------------------------------------------------------------------------------- Lower Extremity Assessment Details Patient Name: [name] Date of Service: 03/06/2020 12:45 PM Medical Record Number: [id number] Patient Account Number: [id number] Date of Birth/Sex: [birth year] (74 y.o. F) Treating RN: Cornell Barman Primary Care Passion Lavin: [name] Other  Clinician: Referring Blondine Hottel: [name] Treating Cinzia Devos/Extender: Melburn Hake, HOYT Weeks in Treatment: 0 Edema Assessment Assessed: [Left: No] [Right: No] [Left: Edema] [Right: :] Calf Left: Right: Point of Measurement: 34 cm From Medial Instep 43.8 cm cm Ankle Left: Right: Point of Measurement: 10 cm From Medial Instep 27.1 cm cm Vascular Assessment Pulses: Dorsalis Pedis Palpable: [Left:Yes] Posterior Tibial Palpable: [Left:Yes] Blood Pressure: Brachial: [Left:118] Ankle: [Left:Dorsalis Pedis: 142 1.20] Electronic Signature(s) Signed: 03/06/2020 5:48:04 PM By: Gretta Cool, BSN, RN, CWS, Kim RN, BSN Entered By: Gretta Cool, BSN, RN, CWS, Kim on 03/06/2020 13:52:09 [name] ([id number]) -------------------------------------------------------------------------------- Multi Wound Chart Details Patient Name: [name] Date of Service: 03/06/2020 12:45 PM Medical Record Number: [id number] Patient Account Number: [id number] Date of Birth/Sex: 01-Mar-1946 (74 y.o. F) Treating RN: Cornell Barman Primary Care Floreen Teegarden: [name] Other Clinician: Referring Reathel Turi: [name] Treating Arly Salminen/Extender: STONE III, HOYT Weeks in Treatment: 0 Vital Signs Height(in): 5 Pulse(bpm): 21 Weight(lbs): 263 Blood Pressure(mmHg): 135/45 Body Mass Index(BMI): 39 Temperature(F): 98.2 Respiratory Rate(breaths/min): 18 Photos: [N/A:N/A] Wound Location: Left, Posterior Lower Leg N/A N/A Wounding Event: Gradually Appeared N/A N/A Primary Etiology: Venous Leg Ulcer N/A N/A Comorbid History: Cataracts, Congestive Heart Failure, N/A N/A Hypertension, Type II Diabetes, Osteoarthritis, Neuropathy, Received Chemotherapy, Received Radiation Date Acquired: 03/06/2020 N/A N/A Weeks of Treatment: 0 N/A N/A Wound Status: Open N/A N/A Measurements L x W x D (cm) 1.3x1.2x0.1 N/A N/A Area (cm) : 1.225 N/A N/A Volume (cm) : 0.123 N/A N/A % Reduction in Area: 0.00% N/A N/A % Reduction  in Volume: 0.00% N/A N/A Classification: Full Thickness Without Exposed N/A N/A Support Structures Exudate Amount: Medium N/A N/A Exudate Type: Serous N/A N/A Exudate Color: amber N/A N/A Wound Margin: Flat and Intact N/A N/A Granulation Amount: None Present (0%) N/A N/A Necrotic Amount: Large (67-100%) N/A N/A Exposed Structures: Fat Layer (Subcutaneous Tissue) N/A N/A Exposed: Yes Fascia: No Tendon: No Muscle: No Joint: No Bone: No Epithelialization: None N/A N/A Debridement: Debridement - Excisional N/A N/A Pre-procedure Verification/Time 14:00 N/A N/A Out Taken: Pain Control: Lidocaine 4% Topical Solution N/A N/A Tissue Debrided: Subcutaneous, Slough N/A N/A Level: Skin/Subcutaneous Tissue N/A N/A Debridement Area (sq cm): 1.56 N/A N/A Instrument: Curette N/A N/A Bleeding: Moderate N/A N/A Hemostasis Achieved: Pressure N/A N/A Procedural Pain: 0 N/A N/A [name], [name] ([id number]) Post Procedural Pain: 0 N/A N/A  Debridement Treatment Procedure was tolerated well N/A N/A Response: Post Debridement 1.3x1.2x0.2 N/A N/A Measurements L x W x D (cm) Post Debridement Volume: 0.245 N/A N/A (cm) Procedures Performed: Debridement N/A N/A Treatment Notes Electronic Signature(s) Signed: 03/06/2020 4:53:00 PM By: Gretta Cool, BSN, RN, CWS, Kim RN, BSN Entered By: Gretta Cool, BSN, RN, CWS, Kim on 03/06/2020 16:53:00 [name] ([id number]) -------------------------------------------------------------------------------- Multi-Disciplinary Care Plan Details Patient Name: [name] Date of Service: 03/06/2020 12:45 PM Medical Record Number: [id number] Patient Account Number: [id number] Date of Birth/Sex: 1945-09-13 (74 y.o. F) Treating RN: Cornell Barman Primary Care Hendrik Donath: [name] Other Clinician: Referring Azarah Dacy: [name] Treating Leiana Rund/Extender: Melburn Hake, HOYT Weeks in Treatment: 0 Active Inactive Necrotic Tissue Nursing Diagnoses: Impaired tissue  integrity related to necrotic/devitalized tissue Knowledge deficit related to management of necrotic/devitalized tissue Goals: Necrotic/devitalized tissue will be minimized in the wound bed Date Initiated: 03/06/2020 Target Resolution Date: 04/05/2020 Goal Status: Active Patient/caregiver will verbalize understanding of reason and process for debridement of necrotic tissue Date Initiated: 03/06/2020 Target Resolution Date: 03/06/2020 Goal Status: Active Interventions: Assess patient pain level pre-, during and post procedure and prior to discharge Provide education on necrotic tissue and debridement process Treatment Activities: Apply topical anesthetic as ordered : 03/06/2020 Notes: Nutrition Nursing Diagnoses: Impaired glucose control: actual or potential Goals: Patient/caregiver verbalizes understanding of need to maintain therapeutic glucose control per primary care physician Date Initiated: 03/06/2020 Target Resolution Date: 04/05/2020 Goal Status: Active Interventions: Provide education on elevated blood sugars and impact on wound healing Treatment Activities: Patient referred to Primary Care Physician for further nutritional evaluation : 03/06/2020 Notes: Orientation to the Wound Care Program Nursing Diagnoses: Knowledge deficit related to the wound healing center program Goals: Patient/caregiver will verbalize understanding of the [place] Program Date Initiated: 03/06/2020 Target Resolution Date: 04/05/2020 Goal Status: Active Interventions: Provide education on orientation to the wound center Notes: [name], [name] ([id number]) Pain, Acute or Chronic Nursing Diagnoses: Pain Management - Non-cyclic Acute (Procedural) Pain, acute or chronic: actual or potential Goals: Patient will verbalize adequate pain control and receive pain control interventions during procedures as needed Date Initiated: 03/06/2020 Target Resolution Date: 04/05/2020 Goal Status:  Active Interventions: Reposition patient for comfort Notes: Venous Leg Ulcer Nursing Diagnoses: Potential for venous Insuffiency (use before diagnosis confirmed) Goals: Patient will maintain optimal edema control Date Initiated: 03/06/2020 Target Resolution Date: 04/05/2020 Goal Status: Active Interventions: Assess peripheral edema status every visit. Compression as ordered Treatment Activities: Therapeutic compression applied : 03/06/2020 Notes: Wound/Skin Impairment Nursing Diagnoses: Impaired tissue integrity Goals: Patient/caregiver will verbalize understanding of skin care regimen Date Initiated: 03/06/2020 Target Resolution Date: 04/05/2020 Goal Status: Active Ulcer/skin breakdown will have a volume reduction of 30% by week 4 Date Initiated: 03/06/2020 Target Resolution Date: 04/05/2020 Goal Status: Active Interventions: Assess ulceration(s) every visit Treatment Activities: Skin care regimen initiated : 03/06/2020 Notes: Electronic Signature(s) Signed: 03/06/2020 4:52:38 PM By: Gretta Cool, BSN, RN, CWS, Kim RN, BSN Entered By: Gretta Cool, BSN, RN, CWS, Kim on 03/06/2020 16:52:38 [name] ([id number]) -------------------------------------------------------------------------------- Pain Assessment Details Patient Name: [name] Date of Service: 03/06/2020 12:45 PM Medical Record Number: [id number] Patient Account Number: [id number] Date of Birth/Sex: [birth year] (74 y.o. F) Treating RN: Cornell Barman Primary Care Keandria Berrocal: [name] Other Clinician: Referring Nasiir Monts: [name] Treating Marsha Hillman/Extender: STONE III, HOYT Weeks in Treatment: 0 Active Problems Location of Pain Severity and Description of Pain Patient Has Paino Yes Site Locations Rate the pain. Current Pain Level: 9 Pain Management and Medication Current Pain Management: Electronic Signature(s) Signed:  03/06/2020 4:50:25 PM By: Paulla Fore, RRT, CHT Signed: 03/06/2020  5:48:04 PM By: Gretta Cool BSN, RN, CWS, Kim RN, BSN Entered By: Lorine Bears on 03/06/2020 13:14:30 [name] ([id number]) -------------------------------------------------------------------------------- Patient/Caregiver Education Details Patient Name: [name] Date of Service: 03/06/2020 12:45 PM Medical Record Number: [id number] Patient Account Number: [id number] Date of Birth/Gender: 1946-06-18 (74 y.o. F) Treating RN: Cornell Barman Primary Care Physician: [name] Other Clinician: Referring Physician: [name] Treating Physician/Extender: Sharalyn Ink in Treatment: 0 Education Assessment Education Provided To: Patient Education Topics Provided Elevated Blood Sugar/ Impact on Healing: Handouts: Elevated Blood Sugars: How Do They Affect Wound Healing, Other: Borderline DM Methods: Demonstration Welcome To [facility] [place]: Handouts: Welcome To [facility] [place] Methods: Explain/Verbal Wound Debridement: Handouts: Wound Debridement Methods: Explain/Verbal Electronic Signature(s) Signed: 03/06/2020 5:48:04 PM By: Gretta Cool, BSN, RN, CWS, Kim RN, BSN Entered By: Gretta Cool, BSN, RN, CWS, Kim on 03/06/2020 16:54:29 [name] ([id number]) -------------------------------------------------------------------------------- Wound Assessment Details Patient Name: [name] Date of Service: 03/06/2020 12:45 PM Medical Record Number: [id number] Patient Account Number: [id number] Date of Birth/Sex: Mar 24, 1946 (74 y.o. F) Treating RN: Cornell Barman Primary Care Nitin Mckowen: [name] Other Clinician: Referring Brittnei Jagiello: [name] Treating Manaal Mandala/Extender: STONE III, HOYT Weeks in Treatment: 0 Wound Status Wound Number: 1 Primary Venous Leg Ulcer Etiology: Wound Location: Left, Posterior Lower Leg Wound Open Wounding Event: Gradually Appeared Status: Date Acquired: 03/06/2020 Comorbid Cataracts, Congestive Heart Failure,  Hypertension, Type II Weeks Of Treatment: 0 History: Diabetes, Osteoarthritis, Neuropathy, Received Clustered Wound: No Chemotherapy, Received Radiation Photos Wound Measurements Length: (cm) 1.3 Width: (cm) 1.2 Depth: (cm) 0.1 Area: (cm) 1.225 Volume: (cm) 0.123 % Reduction in Area: 0% % Reduction in Volume: 0% Epithelialization: None Tunneling: No Undermining: No Wound Description Classification: Full Thickness Without Exposed Support Structu Wound Margin: Flat and Intact Exudate Amount: Medium Exudate Type: Serous Exudate Color: amber res Foul Odor After Cleansing: No Slough/Fibrino Yes Wound Bed Granulation Amount: None Present (0%) Exposed Structure Necrotic Amount: Large (67-100%) Fascia Exposed: No Necrotic Quality: Adherent Slough Fat Layer (Subcutaneous Tissue) Exposed: Yes Tendon Exposed: No Muscle Exposed: No Joint Exposed: No Bone Exposed: No Treatment Notes Wound #1 (Left, Posterior Lower Leg) 1. Cleansed with: Clean wound with Normal Saline 2. Anesthetic Topical Lidocaine 4% cream to wound bed prior to debridement [name], [name] ([id number]) 3. Peri-wound Care: Other peri-wound care (specify in notes) 4. Dressing Applied: Iodoflex 5. Secondary Dressing Applied ABD Pad 7. Secured with 4-Layer Compression System - Left Lower Extremity Notes TCA applied to leg for itching Electronic Signature(s) Signed: 03/06/2020 4:29:39 PM By: Sandre Kitty Signed: 03/06/2020 5:48:04 PM By: Gretta Cool, BSN, RN, CWS, Kim RN, BSN Entered By: Sandre Kitty on 03/06/2020 13:58:13 [name], [name] ([id number]) -------------------------------------------------------------------------------- [place] Details Patient Name: [name] Date of Service: 03/06/2020 12:45 PM Medical Record Number: [id number] Patient Account Number: [id number] Date of Birth/Sex: 02-21-1946 (74 y.o. F) Treating RN: Cornell Barman Primary Care Juston Goheen: [name] Other  Clinician: Referring Serenitee Fuertes: [name] Treating Sarha Bartelt/Extender: STONE III, HOYT Weeks in Treatment: 0 Vital Signs Time Taken: 13:10 Temperature (F): 98.2 Height (in): 69 Pulse (bpm): 53 Source: Stated Respiratory Rate (breaths/min): 18 Weight (lbs): 263 Blood Pressure (mmHg): 135/45 Source: Stated Reference Range: 80 - 120 mg / dl Body Mass Index (BMI): 38.8 Electronic Signature(s) Signed: 03/06/2020 4:50:25 PM By: Lorine Bears RCP, RRT, CHT Entered By: Lorine Bears on 03/06/2020 13:15:28

## 2020-03-07 NOTE — Progress Notes (Signed)
[name], [name] ([id number]) Visit Report for 03/06/2020 Abuse/Suicide Risk Screen Details Patient Name: [name], [name] Date of Service: 03/06/2020 12:45 PM Medical Record Number: [id number] Patient Account Number: [id number] Date of Birth/Sex: 1946-06-29 (74 y.o. F) Treating RN: Cornell Barman Primary Care Edem Tiegs: [name] Other Clinician: Referring Bani Gianfrancesco: [name] Treating Niambi Smoak/Extender: STONE III, HOYT Weeks in Treatment: 0 Abuse/Suicide Risk Screen Items Answer ABUSE RISK SCREEN: Has anyone close to you tried to hurt or harm you recentlyo No Do you feel uncomfortable with anyone in your familyo No Has anyone forced you do things that you didnot want to doo No Electronic Signature(s) Signed: 03/06/2020 5:48:04 PM By: Gretta Cool, BSN, RN, CWS, Kim RN, BSN Entered By: Gretta Cool, BSN, RN, CWS, Kim on 03/06/2020 13:40:46 [name] ([id number]) -------------------------------------------------------------------------------- Activities of Daily Living Details Patient Name: [name] Date of Service: 03/06/2020 12:45 PM Medical Record Number: [id number] Patient Account Number: [id number] Date of Birth/Sex: [birth year] (74 y.o. F) Treating RN: Cornell Barman Primary Care Jarely Juncaj: [name] Other Clinician: Referring Kabella Cassidy: [name] Treating Aniyia Rane/Extender: Melburn Hake, HOYT Weeks in Treatment: 0 Activities of Daily Living Items Answer Activities of Daily Living (Please select one for each item) Drive Automobile Not Able Take Medications Completely Able Use Telephone Completely Able Care for Appearance Completely Able Use Toilet Completely Able Bath / Shower Completely Able Dress Self Completely Able Feed Self Completely Able Walk Completely Able Get In / Out Bed Completely Able Housework Need Assistance Prepare Meals Completely [place] for Self Need Assistance Electronic Signature(s) Signed: 03/06/2020 5:48:04 PM By:  Gretta Cool, BSN, RN, CWS, Kim RN, BSN Entered By: Gretta Cool, BSN, RN, CWS, Kim on 03/06/2020 13:41:15 [name] ([id number]) -------------------------------------------------------------------------------- Education Screening Details Patient Name: [name] Date of Service: 03/06/2020 12:45 PM Medical Record Number: [id number] Patient Account Number: [id number] Date of Birth/Sex: [birth year] (74 y.o. F) Treating RN: Cornell Barman Primary Care Shandie Bertz: [name] Other Clinician: Referring Amiria Orrison: [name] Treating Tarrance Januszewski/Extender: Melburn Hake, HOYT Weeks in Treatment: 0 Primary Learner Assessed: Patient Learning Preferences/Education Level/Primary Language Learning Preference: Explanation, Demonstration Highest Education Level: High School Preferred Language: English Cognitive Barrier Language Barrier: No Translator Needed: No Memory Deficit: No Emotional Barrier: No Cultural/Religious Beliefs Affecting Medical Care: No Physical Barrier Impaired Vision: Yes Glasses Impaired Hearing: No Decreased Hand dexterity: No Knowledge/Comprehension Knowledge Level: High Comprehension Level: High Ability to understand written instructions: High Ability to understand verbal instructions: High Motivation Anxiety Level: Calm Cooperation: Cooperative Education Importance: Acknowledges Need Interest in Health Problems: Asks Questions Perception: Coherent Willingness to Engage in Self-Management High Activities: Readiness to Engage in Self-Management High Activities: Engineer, maintenance) Signed: 03/06/2020 5:48:04 PM By: Gretta Cool, BSN, RN, CWS, Kim RN, BSN Entered By: Gretta Cool, BSN, RN, CWS, Kim on 03/06/2020 13:41:40 [name] ([id number]) -------------------------------------------------------------------------------- Fall Risk Assessment Details Patient Name: [name] Date of Service: 03/06/2020 12:45 PM Medical Record Number: [id number] Patient Account  Number: [id number] Date of Birth/Sex: Oct 26, 1945 (74 y.o. F) Treating RN: Cornell Barman Primary Care Sahiba Granholm: [name] Other Clinician: Referring Claudius Mich: [name] Treating Veneda Kirksey/Extender: Melburn Hake, HOYT Weeks in Treatment: 0 Fall Risk Assessment Items Have you had 2 or more falls in the last 12 monthso 0 No Have you had any fall that resulted in injury in the last 12 monthso 0 No FALLS RISK SCREEN History of falling - immediate or within 3 months 0 No Secondary diagnosis (Do you have 2 or more medical diagnoseso) 0 No Ambulatory aid None/bed rest/wheelchair/nurse 0 No Crutches/cane/walker 15 Yes Furniture 0  No Intravenous therapy Access/Saline/Heparin Lock 0 No Gait/Transferring Normal/ bed rest/ wheelchair 0 No Weak (short steps with or without shuffle, stooped but able to lift head while walking, may 10 Yes seek support from furniture) Impaired (short steps with shuffle, may have difficulty arising from chair, head down, impaired 0 No balance) Mental Status Oriented to own ability 0 Yes Electronic Signature(s) Signed: 03/06/2020 5:48:04 PM By: Gretta Cool, BSN, RN, CWS, Kim RN, BSN Entered By: Gretta Cool, BSN, RN, CWS, Kim on 03/06/2020 13:41:59 [name] ([id number]) -------------------------------------------------------------------------------- Foot Assessment Details Patient Name: [name] Date of Service: 03/06/2020 12:45 PM Medical Record Number: [id number] Patient Account Number: [id number] Date of Birth/Sex: 06-30-1946 (74 y.o. F) Treating RN: Cornell Barman Primary Care Jene Oravec: [name] Other Clinician: Referring Braulio Kiedrowski: [name] Treating Demetre Monaco/Extender: Melburn Hake, HOYT Weeks in Treatment: 0 Foot Assessment Items Site Locations + = Sensation present, - = Sensation absent, C = Callus, U = Ulcer R = Redness, W = Warmth, M = Maceration, PU = Pre-ulcerative lesion F = Fissure, S = Swelling, D = Dryness Assessment Right: Left: Other  Deformity: No No Prior Foot Ulcer: No No Prior Amputation: No No Charcot Joint: No No Ambulatory Status: Ambulatory With Help Assistance Device: Walker GaitEnergy manager) Signed: 03/06/2020 5:48:04 PM By: Gretta Cool, BSN, RN, CWS, Kim RN, BSN Entered By: Gretta Cool, BSN, RN, CWS, Kim on 03/06/2020 13:43:40 [name] ([id number]) -------------------------------------------------------------------------------- Nutrition Risk Screening Details Patient Name: [name] Date of Service: 03/06/2020 12:45 PM Medical Record Number: [id number] Patient Account Number: [id number] Date of Birth/Sex: 23-Feb-1946 (74 y.o. F) Treating RN: Cornell Barman Primary Care Ashante Snelling: [name] Other Clinician: Referring Heloise Gordan: [name] Treating Jonathyn Carothers/Extender: STONE III, HOYT Weeks in Treatment: 0 Height (in): 69 Weight (lbs): 263 Body Mass Index (BMI): 38.8 Nutrition Risk Screening Items Score Screening NUTRITION RISK SCREEN: I have an illness or condition that made me change the kind and/or amount of food I eat 0 No I eat fewer than two meals per day 3 Yes I eat few fruits and vegetables, or milk products 0 No I have three or more drinks of beer, liquor or wine almost every day 0 No I have tooth or mouth problems that make it hard for me to eat 0 No I don't always have enough money to buy the food I need 0 No I eat alone most of the time 0 No I take three or more different prescribed or over-the-counter drugs a day 1 Yes Without wanting to, I have lost or gained 10 pounds in the last six months 0 No I am not always physically able to shop, cook and/or feed myself 0 No Nutrition Protocols Good Risk Protocol Provide education on elevated Moderate Risk Protocol 0 blood sugars and impact on wound healing, as applicable High Risk Proctocol Risk Level: Moderate Risk Score: 4 Electronic Signature(s) Signed: 03/06/2020 5:48:04 PM By: Gretta Cool, BSN, RN, CWS, Kim RN,  BSN Entered By: Gretta Cool, BSN, RN, CWS, Kim on 03/06/2020 13:42:36

## 2020-03-15 ENCOUNTER — Encounter: Payer: Medicare HMO | Attending: Physician Assistant | Admitting: Physician Assistant

## 2020-03-15 ENCOUNTER — Other Ambulatory Visit: Payer: Self-pay

## 2020-03-15 DIAGNOSIS — L97922 Non-pressure chronic ulcer of unspecified part of left lower leg with fat layer exposed: Secondary | ICD-10-CM | POA: Diagnosis not present

## 2020-03-15 DIAGNOSIS — Z853 Personal history of malignant neoplasm of breast: Secondary | ICD-10-CM | POA: Insufficient documentation

## 2020-03-15 DIAGNOSIS — L97822 Non-pressure chronic ulcer of other part of left lower leg with fat layer exposed: Secondary | ICD-10-CM | POA: Diagnosis present

## 2020-03-15 DIAGNOSIS — I5042 Chronic combined systolic (congestive) and diastolic (congestive) heart failure: Secondary | ICD-10-CM | POA: Insufficient documentation

## 2020-03-15 DIAGNOSIS — E11622 Type 2 diabetes mellitus with other skin ulcer: Secondary | ICD-10-CM | POA: Diagnosis not present

## 2020-03-15 DIAGNOSIS — M1990 Unspecified osteoarthritis, unspecified site: Secondary | ICD-10-CM | POA: Diagnosis not present

## 2020-03-15 DIAGNOSIS — I872 Venous insufficiency (chronic) (peripheral): Secondary | ICD-10-CM | POA: Diagnosis not present

## 2020-03-15 DIAGNOSIS — E1151 Type 2 diabetes mellitus with diabetic peripheral angiopathy without gangrene: Secondary | ICD-10-CM | POA: Diagnosis not present

## 2020-03-15 DIAGNOSIS — I110 Hypertensive heart disease with heart failure: Secondary | ICD-10-CM | POA: Insufficient documentation

## 2020-03-15 DIAGNOSIS — E1140 Type 2 diabetes mellitus with diabetic neuropathy, unspecified: Secondary | ICD-10-CM | POA: Insufficient documentation

## 2020-03-15 NOTE — Progress Notes (Addendum)
LAURNA, SHETLEY ([id number]) Visit Report for 03/15/2020 Chief Complaint Document Details Patient Name: [name], [name] Date of Service: 03/15/2020 3:15 PM Medical Record Number: [id number] Patient Account Number: [id number] Date of Birth/Sex: August 13, 1946 (74 y.o. F) Treating RN: Army Melia Primary Care Provider: [name] Other Clinician: Referring Provider: [name] Treating Provider/Extender: Melburn Hake, Samin Milke Weeks in Treatment: 1 Information Obtained from: Patient Chief Complaint Left LE Ulcer Electronic Signature(s) Signed: 03/15/2020 4:09:56 PM By: Worthy Keeler PA-C Entered By: Worthy Keeler on 03/15/2020 16:09:56 [name] ([id number]) -------------------------------------------------------------------------------- Debridement Details Patient Name: [name] Date of Service: 03/15/2020 3:15 PM Medical Record Number: [id number] Patient Account Number: [id number] Date of Birth/Sex: [birth year] (74 y.o. F) Treating RN: Army Melia Primary Care Provider: [name] Other Clinician: Referring Provider: [name] Treating Provider/Extender: STONE III, Analiz Tvedt Weeks in Treatment: 1 Debridement Performed for Wound #1 Left,Posterior Lower Leg Assessment: Performed By: Physician STONE III, Nishan Ovens E., PA-C Debridement Type: Debridement Severity of Tissue Pre Debridement: Fat layer exposed Level of Consciousness (Pre- Awake and Alert procedure): Pre-procedure Verification/Time Out Yes - 16:15 Taken: Start Time: 16:15 Pain Control: Other : 20% benzocaine Total Area Debrided (L x W): 1.5 (cm) x 1.4 (cm) = 2.1 (cm) Tissue and other material Viable, Non-Viable, Slough, Subcutaneous, Slough debrided: Level: Skin/Subcutaneous Tissue Debridement Description: Excisional Instrument: Curette Bleeding: Moderate Hemostasis Achieved: Pressure End Time: 16:20 Procedural Pain: 4 Post Procedural Pain: 1 Response to Treatment: Procedure was tolerated well Level  of Consciousness (Post- Awake and Alert procedure): Post Debridement Measurements of Total Wound Length: (cm) 1.5 Width: (cm) 1.4 Depth: (cm) 0.1 Volume: (cm) 0.165 Character of Wound/Ulcer Post Debridement: Improved Severity of Tissue Post Debridement: Fat layer exposed Post Procedure Diagnosis Same as Pre-procedure Electronic Signature(s) Signed: 03/15/2020 4:55:37 PM By: Worthy Keeler PA-C Signed: 03/21/2020 4:08:07 PM By: Army Melia Entered By: Worthy Keeler on 03/15/2020 16:20:44 [name] ([id number]) -------------------------------------------------------------------------------- HPI Details Patient Name: [name] Date of Service: 03/15/2020 3:15 PM Medical Record Number: [id number] Patient Account Number: [id number] Date of Birth/Sex: 1945-11-17 (74 y.o. F) Treating RN: Army Melia Primary Care Provider: [name] Other Clinician: Referring Provider: [name] Treating Provider/Extender: Melburn Hake, Tiombe Tomeo Weeks in Treatment: 1 History of Present Illness HPI Description: 03/15/2020 upon evaluation today patient presents today for initial evaluation here in our clinic concerning a wound that is on the left posterior lower extremity. Unfortunately this has been giving the patient some discomfort at this point which she notes has been affected her pretty much daily. The patient does have a history of venous insufficiency, hypertension, congestive heart failure, and a history of breast cancer. Fortunately there does not appear to be evidence of active infection at this time which is great news. No fevers, chills, nausea, vomiting, or diarrhea. Wound is not extremely large but does have some slough covering the surface of the wound. This is going require sharp debridement today. 03/15/2020 upon evaluation today patient appears to be doing fairly well in regard to her wound. She does have some slough noted on the surface of the wound currently but I do believe  the Iodoflex has been beneficial over the past week. There is no signs of active infection at this time which is great news and overall very pleased with the progress. No fevers, chills, nausea, vomiting, or diarrhea. Electronic Signature(s) Signed: 03/15/2020 4:33:47 PM By: Worthy Keeler PA-C Entered By: Worthy Keeler on 03/15/2020 16:33:47 [name] ([id number]) -------------------------------------------------------------------------------- Physical Exam Details Patient Name: [name] Date of  Service: 03/15/2020 3:15 PM Medical Record Number: [id number] Patient Account Number: [id number] Date of Birth/Sex: 12-Aug-1946 (74 y.o. F) Treating RN: Army Melia Primary Care Provider: [name] Other Clinician: Referring Provider: [name] Treating Provider/Extender: STONE III, [name] [name] Weeks in Treatment: 1 Constitutional Well-nourished and well-hydrated in no acute distress. Respiratory normal breathing without difficulty. Psychiatric this patient is able to make decisions and demonstrates good insight into disease process. Alert and Oriented x 3. pleasant and cooperative. Notes Upon inspection patient's wound bed actually showed signs of good granulation and overall I am extremely pleased with how this seems to be progressing once I removed the slough from the surface of the wound. She did have some pain with debridement but fortunately post debridement we were able to apply some benzocaine and she felt improved greatly. Overall the wound bed appears to be doing much better which is great news. Electronic Signature(s) Signed: 03/15/2020 4:34:13 PM By: Worthy Keeler PA-C Entered By: Worthy Keeler on 03/15/2020 16:34:12 [name] ([id number]) -------------------------------------------------------------------------------- Physician Orders Details Patient Name: [name] Date of Service: 03/15/2020 3:15 PM Medical Record Number: [id number] Patient  Account Number: [id number] Date of Birth/Sex: 01/27/1946 (74 y.o. F) Treating RN: Army Melia Primary Care Provider: [name] Other Clinician: Referring Provider: [name] Treating Provider/Extender: Melburn Hake, Valery Chance Weeks in Treatment: 1 Verbal / Phone Orders: No Diagnosis Coding ICD-10 Coding Code Description I87.2 Venous insufficiency (chronic) (peripheral) L97.822 Non-pressure chronic ulcer of other part of left lower leg with fat layer exposed I10 Essential (primary) hypertension I50.42 Chronic combined systolic (congestive) and diastolic (congestive) heart failure C50.919 Malignant neoplasm of unspecified site of unspecified female breast Wound Cleansing o Cleanse wound with mild soap and water Anesthetic (add to Medication List) Wound #1 Left,Posterior Lower Leg o Topical Lidocaine 4% cream applied to wound bed prior to debridement (In Clinic Only). Primary Wound Dressing Wound #1 Left,Posterior Lower Leg o Silver Collagen Secondary Dressing Wound #1 Left,Posterior Lower Leg o ABD pad Dressing Change Frequency Wound #1 Left,Posterior Lower Leg o Change dressing every week - in clinic and nurse visit as needed Follow-up Appointments Wound #1 Left,Posterior Lower Leg o Return Appointment in 2 weeks. - Nurse visit for wrap change in 1 week Edema Control Wound #1 Left,Posterior Lower Leg o 3 Layer Compression System - Left Lower Extremity - TCA to leg to help with itching o Elevate legs to the level of the heart and pump ankles as often as possible Additional Orders / Instructions Wound #1 Left,Posterior Lower Leg o Vitamin A; Vitamin C, Zinc o Increase protein intake. Electronic Signature(s) Signed: 03/15/2020 4:55:37 PM By: Worthy Keeler PA-C Entered By: Worthy Keeler on 03/15/2020 16:21:48 [name] ([id number]) -------------------------------------------------------------------------------- Problem List Details Patient Name:  [name] Date of Service: 03/15/2020 3:15 PM Medical Record Number: [id number] Patient Account Number: [id number] Date of Birth/Sex: 01-24-1946 (74 y.o. F) Treating RN: Army Melia Primary Care Provider: [name] Other Clinician: Referring Provider: [name] Treating Provider/Extender: Melburn Hake, Sheva Mcdougle Weeks in Treatment: 1 Active Problems ICD-10 Encounter Code Description Active Date MDM Diagnosis I87.2 Venous insufficiency (chronic) (peripheral) 03/06/2020 No Yes L97.822 Non-pressure chronic ulcer of other part of left lower leg with fat layer 03/06/2020 No Yes exposed [place] (primary) hypertension 03/06/2020 No Yes I50.42 Chronic combined systolic (congestive) and diastolic (congestive) heart 03/06/2020 No Yes failure C50.919 Malignant neoplasm of unspecified site of unspecified female breast 03/06/2020 No Yes Inactive Problems Resolved Problems Electronic Signature(s) Signed: 03/15/2020 4:09:51 PM By: Worthy Keeler  PA-C Entered By: Worthy Keeler on 03/15/2020 16:09:50 [name] ([id number]) -------------------------------------------------------------------------------- Progress Note Details Patient Name: MIDORI, DADO Date of Service: 03/15/2020 3:15 PM Medical Record Number: [id number] Patient Account Number: [id number] Date of Birth/Sex: July 10, 1946 (74 y.o. F) Treating RN: Army Melia Primary Care Provider: [name] Other Clinician: Referring Provider: [name] Treating Provider/Extender: Melburn Hake, Dezra Mandella Weeks in Treatment: 1 Subjective Chief Complaint Information obtained from Patient Left LE Ulcer History of Present Illness (HPI) 03/15/2020 upon evaluation today patient presents today for initial evaluation here in our clinic concerning a wound that is on the left posterior lower extremity. Unfortunately this has been giving the patient some discomfort at this point which she notes has been affected her pretty much daily. The  patient does have a history of venous insufficiency, hypertension, congestive heart failure, and a history of breast cancer. Fortunately there does not appear to be evidence of active infection at this time which is great news. No fevers, chills, nausea, vomiting, or diarrhea. Wound is not extremely large but does have some slough covering the surface of the wound. This is going require sharp debridement today. 03/15/2020 upon evaluation today patient appears to be doing fairly well in regard to her wound. She does have some slough noted on the surface of the wound currently but I do believe the Iodoflex has been beneficial over the past week. There is no signs of active infection at this time which is great news and overall very pleased with the progress. No fevers, chills, nausea, vomiting, or diarrhea. Objective Constitutional Well-nourished and well-hydrated in no acute distress. Vitals Time Taken: 4:05 PM, Height: 69 in, Weight: 263 lbs, BMI: 38.8, Temperature: 98.2 F, Pulse: 90 bpm, Respiratory Rate: 20 breaths/min, Blood Pressure: 123/91 mmHg. Respiratory normal breathing without difficulty. Psychiatric this patient is able to make decisions and demonstrates good insight into disease process. Alert and Oriented x 3. pleasant and cooperative. General Notes: Upon inspection patient's wound bed actually showed signs of good granulation and overall I am extremely pleased with how this seems to be progressing once I removed the slough from the surface of the wound. She did have some pain with debridement but fortunately post debridement we were able to apply some benzocaine and she felt improved greatly. Overall the wound bed appears to be doing much better which is great news. Integumentary (Hair, Skin) Wound #1 status is Open. Original cause of wound was Gradually Appeared. The wound is located on the Left,Posterior Lower Leg. The wound measures 1.5cm length x 1.4cm width x 0.1cm depth;  1.649cm^2 area and 0.165cm^3 volume. There is Fat Layer (Subcutaneous Tissue) Exposed exposed. There is a medium amount of serous drainage noted. The wound margin is flat and intact. There is no granulation within the wound bed. There is a large (67-100%) amount of necrotic tissue within the wound bed including Adherent Slough. Assessment Active Problems ICD-10 Venous insufficiency (chronic) (peripheral) Non-pressure chronic ulcer of other part of left lower leg with fat layer exposed Essential (primary) hypertension Chronic combined systolic (congestive) and diastolic (congestive) heart failure JEILANI, GRUPE ([id number]) Malignant neoplasm of unspecified site of unspecified female breast Procedures Wound #1 Pre-procedure diagnosis of Wound #1 is a Venous Leg Ulcer located on the Left,Posterior Lower Leg .Severity of Tissue Pre Debridement is: Fat layer exposed. There was a Excisional Skin/Subcutaneous Tissue Debridement with a total area of 2.1 sq cm performed by STONE III, Trystian Crisanto E., PA-C. With the following instrument(s): Curette to remove Viable and Non-Viable tissue/material. Material  removed includes Subcutaneous Tissue and Slough and after achieving pain control using Other (20% benzocaine). No specimens were taken. A time out was conducted at 16:15, prior to the start of the procedure. A Moderate amount of bleeding was controlled with Pressure. The procedure was tolerated well with a pain level of 4 throughout and a pain level of 1 following the procedure. Post Debridement Measurements: 1.5cm length x 1.4cm width x 0.1cm depth; 0.165cm^3 volume. Character of Wound/Ulcer Post Debridement is improved. Severity of Tissue Post Debridement is: Fat layer exposed. Post procedure Diagnosis Wound #1: Same as Pre-Procedure Plan Wound Cleansing: Cleanse wound with mild soap and water Anesthetic (add to Medication List): Wound #1 Left,Posterior Lower Leg: Topical Lidocaine 4% cream applied  to wound bed prior to debridement (In Clinic Only). Primary Wound Dressing: Wound #1 Left,Posterior Lower Leg: Silver Collagen Secondary Dressing: Wound #1 Left,Posterior Lower Leg: ABD pad Dressing Change Frequency: Wound #1 Left,Posterior Lower Leg: Change dressing every week - in clinic and nurse visit as needed Follow-up Appointments: Wound #1 Left,Posterior Lower Leg: Return Appointment in 2 weeks. - Nurse visit for wrap change in 1 week Edema Control: Wound #1 Left,Posterior Lower Leg: 3 Layer Compression System - Left Lower Extremity - TCA to leg to help with itching Elevate legs to the level of the heart and pump ankles as often as possible Additional Orders / Instructions: Wound #1 Left,Posterior Lower Leg: Vitamin A; Vitamin C, Zinc Increase protein intake. 1. I would suggest currently that we go ahead and initiate treatment with a continuation of the 3 layer compression wrap which I do feel like has been beneficial for the patient. 2. I am also can recommend that we go ahead and have the patient continue with the silver collagen to the wound bed which I think would be better than the Iodoflex at this point considering how well everything looks. 3. I am also going to suggest that we have her continue to be monitored for any signs of infection should have a nurse visit next week and I will see her following. We will see patient back for reevaluation in 1 week here in the clinic. If anything worsens or changes patient will contact our office for additional recommendations. Electronic Signature(s) Signed: 03/15/2020 4:36:03 PM By: Worthy Keeler PA-C Entered By: Worthy Keeler on 03/15/2020 16:36:02 [name] ([id number]) -------------------------------------------------------------------------------- SuperBill Details Patient Name: [name] Date of Service: 03/15/2020 Medical Record Number: [id number] Patient Account Number: [id number] Date of Birth/Sex:  10/12/1945 (74 y.o. F) Treating RN: Army Melia Primary Care Provider: [name] Other Clinician: Referring Provider: [name] Treating Provider/Extender: Melburn Hake, Quintavius Niebuhr Weeks in Treatment: 1 Diagnosis Coding ICD-10 Codes Code Description I87.2 Venous insufficiency (chronic) (peripheral) L97.822 Non-pressure chronic ulcer of other part of left lower leg with fat layer exposed I10 Essential (primary) hypertension I50.42 Chronic combined systolic (congestive) and diastolic (congestive) heart failure C50.919 Malignant neoplasm of unspecified site of unspecified female breast Facility Procedures CPT4 Code: [id number] Description: 11042 - DEB SUBQ TISSUE 20 SQ CM/< Modifier: Quantity: 1 CPT4 Code: Description: ICD-10 Diagnosis Description L97.822 Non-pressure chronic ulcer of other part of left lower leg with fat layer Modifier: exposed Quantity: Physician Procedures CPT4 Code: [id number] Description: 11042 - WC PHYS SUBQ TISS 20 SQ CM Modifier: Quantity: 1 CPT4 Code: Description: ICD-10 Diagnosis Description L97.822 Non-pressure chronic ulcer of other part of left lower leg with fat layer Modifier: exposed Quantity: Electronic Signature(s) Signed: 03/15/2020 4:40:25 PM By: Worthy Keeler PA-C Entered By: Joaquim Lai  IIIMargarita Grizzle on 03/15/2020 16:40:25

## 2020-03-19 ENCOUNTER — Other Ambulatory Visit: Payer: Self-pay | Admitting: Oncology

## 2020-03-20 ENCOUNTER — Other Ambulatory Visit: Payer: Self-pay

## 2020-03-21 NOTE — Progress Notes (Signed)
ADDYSIN, PORCO ([id number]) Visit Report for 03/15/2020 Arrival Information Details Patient Name: [name], [name] Date of Service: 03/15/2020 3:15 PM Medical Record Number: [id number] Patient Account Number: [id number] Date of Birth/Sex: 08-23-1946 (74 y.o. F) Treating RN: Army Melia Primary Care Waleska Buttery: [name] Other Clinician: Referring Isobel Eisenhuth: [name] Treating Vista Sawatzky/Extender: Melburn Hake, HOYT Weeks in Treatment: 1 Visit Information History Since Last Visit Added or deleted any medications: No Patient Arrived: Walker Any new allergies or adverse reactions: No Arrival Time: 16:05 Had a fall or experienced change in No Accompanied By: self activities of daily living that may affect Transfer Assistance: None risk of falls: Patient Identification Verified: Yes Signs or symptoms of abuse/neglect since last visito No Secondary Verification Process Completed: Yes Hospitalized since last visit: No Implantable device outside of the clinic excluding No cellular tissue based products placed in the center since last visit: Has Dressing in Place as Prescribed: Yes Has Compression in Place as Prescribed: Yes Pain Present Now: Yes Electronic Signature(s) Signed: 03/15/2020 4:42:13 PM By: Lorine Bears RCP, RRT, CHT Entered By: Lorine Bears on 03/15/2020 16:05:47 [name] ([id number]) -------------------------------------------------------------------------------- Compression Therapy Details Patient Name: [name] Date of Service: 03/15/2020 3:15 PM Medical Record Number: [id number] Patient Account Number: [id number] Date of Birth/Sex: October 12, 1945 (74 y.o. F) Treating RN: Cornell Barman Primary Care Lorelie Biermann: [name] Other Clinician: Referring Lorelei Heikkila: [name] Treating Kadar Chance/Extender: Melburn Hake, HOYT Weeks in Treatment: 1 Compression Therapy Performed for Wound Assessment: Wound #1 Left,Posterior Lower Leg Performed  By: Clinician Cornell Barman, RN Compression Type: Three Layer Pre Treatment ABI: 1.2 Post Procedure Diagnosis Same as Pre-procedure Electronic Signature(s) Signed: 03/15/2020 5:01:58 PM By: Gretta Cool, BSN, RN, CWS, Kim RN, BSN Entered By: Gretta Cool, BSN, RN, CWS, Kim on 03/15/2020 17:01:57 [name] ([id number]) -------------------------------------------------------------------------------- Encounter Discharge Information Details Patient Name: [name] Date of Service: 03/15/2020 3:15 PM Medical Record Number: [id number] Patient Account Number: [id number] Date of Birth/Sex: 1945-10-17 (74 y.o. F) Treating RN: Cornell Barman Primary Care Kanyia Heaslip: [name] Other Clinician: Referring Temesha Queener: [name] Treating Nishaan Stanke/Extender: Melburn Hake, HOYT Weeks in Treatment: 1 Encounter Discharge Information Items Post Procedure Vitals Discharge Condition: Stable Temperature (F): 98 Ambulatory Status: Ambulatory Pulse (bpm): 90 Discharge Destination: Home Respiratory Rate (breaths/min): 18 Transportation: Private Auto Blood Pressure (mmHg): 123/91 Accompanied By: self Schedule Follow-up Appointment: Yes Clinical Summary of Care: Electronic Signature(s) Signed: 03/15/2020 5:04:59 PM By: Gretta Cool, BSN, RN, CWS, Kim RN, BSN Entered By: Gretta Cool, BSN, RN, CWS, Kim on 03/15/2020 17:04:59 [name] ([id number]) -------------------------------------------------------------------------------- Lower Extremity Assessment Details Patient Name: [name] Date of Service: 03/15/2020 3:15 PM Medical Record Number: [id number] Patient Account Number: [id number] Date of Birth/Sex: 1945-12-03 (74 y.o. F) Treating RN: Army Melia Primary Care Joanette Silveria: [name] Other Clinician: Referring Galileo Colello: [name] Treating Cash Meadow/Extender: STONE III, HOYT Weeks in Treatment: 1 Edema Assessment Assessed: [Left: No] [Right: No] Edema: [Left: Ye] [Right: s] Calf Left: Right: Point of  Measurement: 34 cm From Medial Instep 43 cm cm Ankle Left: Right: Point of Measurement: 10 cm From Medial Instep 26 cm cm Vascular Assessment Pulses: Dorsalis Pedis Palpable: [Left:Yes] Posterior Tibial Palpable: [Left:Yes] Electronic Signature(s) Signed: 03/15/2020 4:55:37 PM By: Worthy Keeler PA-C Signed: 03/21/2020 4:08:07 PM By: Army Melia Entered By: Worthy Keeler on 03/15/2020 16:13:34 [name] ([id number]) -------------------------------------------------------------------------------- Multi Wound Chart Details Patient Name: [name] Date of Service: 03/15/2020 3:15 PM Medical Record Number: [id number] Patient Account Number: [id number] Date of Birth/Sex: 1946/05/29 (74 y.o. F) Treating RN: Cornell Barman Primary Care  Taisley Mordan: [name] Other Clinician: Referring Tiawana Forgy: [name] Treating Kyl Givler/Extender: STONE III, HOYT Weeks in Treatment: 1 Vital Signs Height(in): 69 Pulse(bpm): 90 Weight(lbs): 188 Blood Pressure(mmHg): 123/91 Body Mass Index(BMI): 39 Temperature(F): 98.2 Respiratory Rate(breaths/min): 20 Photos: [1:No Photos] [N/A:N/A] Wound Location: [1:Left, Posterior Lower Leg] [N/A:N/A] Wounding Event: [1:Gradually Appeared] [N/A:N/A] Primary Etiology: [1:Venous Leg Ulcer] [N/A:N/A] Comorbid History: [1:Cataracts, Congestive Heart Failure, N/A Hypertension, Type II Diabetes, Osteoarthritis, Neuropathy, Received Chemotherapy, Received Radiation] Date Acquired: [1:03/06/2020] [N/A:N/A] Weeks of Treatment: [1:1] [N/A:N/A] Wound Status: [1:Open] [N/A:N/A] Measurements L x W x D (cm) [1:1.5x1.4x0.1] [N/A:N/A] Area (cm) : [1:1.649] [N/A:N/A] Volume (cm) : [1:0.165] [N/A:N/A] % Reduction in Area: [1:-34.60%] [N/A:N/A] % Reduction in Volume: [1:-34.10%] [N/A:N/A] Classification: [1:Full Thickness Without Exposed Support Structures] [N/A:N/A] Exudate Amount: [1:Medium] [N/A:N/A] Exudate Type: [1:Serous] [N/A:N/A] Exudate Color:  [1:amber] [N/A:N/A] Wound Margin: [1:Flat and Intact] [N/A:N/A] Granulation Amount: [1:None Present (0%)] [N/A:N/A] Necrotic Amount: [1:Large (67-100%)] [N/A:N/A] Exposed Structures: [1:Fat Layer (Subcutaneous Tissue) Exposed: Yes Fascia: No Tendon: No Muscle: No Joint: No Bone: No] [N/A:N/A] Epithelialization: [1:None] [N/A:N/A] Debridement: [1:Debridement - Excisional] [N/A:N/A] Pre-procedure Verification/Time 16:15 [N/A:N/A] Out Taken: Pain Control: [1:Other] [N/A:N/A] Tissue Debrided: [1:Subcutaneous, Slough] [N/A:N/A] Level: [1:Skin/Subcutaneous Tissue] [N/A:N/A] Debridement Area (sq cm): [1:2.1] [N/A:N/A] Instrument: [1:Curette] [N/A:N/A] Bleeding: [1:Moderate] [N/A:N/A] Hemostasis Achieved: [1:Pressure] [N/A:N/A] Procedural Pain: [1:4] [N/A:N/A] Post Procedural Pain: [1:1] [N/A:N/A] Debridement Treatment [1:Procedure was tolerated well] [N/A:N/A] Response: Post Debridement [1:1.5x1.4x0.1] [N/A:N/A] Measurements L x W x D (cm) Post Debridement Volume: [1:0.165] [N/A:N/A] (cm) Procedures Performed: [1:Debridement] [N/A:N/A] Treatment Notes Electronic Signature(s) Signed: 03/15/2020 4:58:47 PM By: Gretta Cool, BSN, RN, CWS, Kim RN, BSN Entered By: Gretta Cool, BSN, RN, CWS, Kim on 03/15/2020 16:58:46 [name] ([id number]) -------------------------------------------------------------------------------- [place] Details Patient Name: [name] Date of Service: 03/15/2020 3:15 PM Medical Record Number: [id number] Patient Account Number: [id number] Date of Birth/Sex: [birth year] (74 y.o. F) Treating RN: Cornell Barman Primary Care Breeana Sawtelle: [name] Other Clinician: Referring Amyre Segundo: [name] Treating Reianna Batdorf/Extender: Melburn Hake, HOYT Weeks in Treatment: 1 Active Inactive Necrotic Tissue Nursing Diagnoses: Impaired tissue integrity related to necrotic/devitalized tissue Knowledge deficit related to management of necrotic/devitalized  tissue Goals: Necrotic/devitalized tissue will be minimized in the wound bed Date Initiated: 03/06/2020 Target Resolution Date: 04/05/2020 Goal Status: Active Patient/caregiver will verbalize understanding of reason and process for debridement of necrotic tissue Date Initiated: 03/06/2020 Target Resolution Date: 03/06/2020 Goal Status: Active Interventions: Assess patient pain level pre-, during and post procedure and prior to discharge Provide education on necrotic tissue and debridement process Treatment Activities: Apply topical anesthetic as ordered : 03/06/2020 Notes: Nutrition Nursing Diagnoses: Impaired glucose control: actual or potential Goals: Patient/caregiver verbalizes understanding of need to maintain therapeutic glucose control per primary care physician Date Initiated: 03/06/2020 Target Resolution Date: 04/05/2020 Goal Status: Active Interventions: Provide education on elevated blood sugars and impact on wound healing Treatment Activities: Patient referred to Primary Care Physician for further nutritional evaluation : 03/06/2020 Notes: Orientation to the Wound Care Program Nursing Diagnoses: Knowledge deficit related to the wound healing center program Goals: Patient/caregiver will verbalize understanding of the [facility] Date Initiated: 03/06/2020 Target Resolution Date: 04/05/2020 Goal Status: Active Interventions: Provide education on orientation to the wound center Notes: [name], [name] ([id number]) Pain, Acute or Chronic Nursing Diagnoses: Pain Management - Non-cyclic Acute (Procedural) Pain, acute or chronic: actual or potential Goals: Patient will verbalize adequate pain control and receive pain control interventions during procedures as needed Date Initiated: 03/06/2020 Target Resolution Date: 04/05/2020 Goal Status: Active Interventions: Reposition patient for  comfort Notes: Venous Leg Ulcer Nursing Diagnoses: Potential for  venous Insuffiency (use before diagnosis confirmed) Goals: Patient will maintain optimal edema control Date Initiated: 03/06/2020 Target Resolution Date: 04/05/2020 Goal Status: Active Interventions: Assess peripheral edema status every visit. Compression as ordered Treatment Activities: Therapeutic compression applied : 03/06/2020 Notes: Wound/Skin Impairment Nursing Diagnoses: Impaired tissue integrity Goals: Patient/caregiver will verbalize understanding of skin care regimen Date Initiated: 03/06/2020 Target Resolution Date: 04/05/2020 Goal Status: Active Ulcer/skin breakdown will have a volume reduction of 30% by week 4 Date Initiated: 03/06/2020 Target Resolution Date: 04/05/2020 Goal Status: Active Interventions: Assess ulceration(s) every visit Treatment Activities: Skin care regimen initiated : 03/06/2020 Notes: Electronic Signature(s) Signed: 03/15/2020 4:58:35 PM By: Gretta Cool, BSN, RN, CWS, Kim RN, BSN Previous Signature: 03/15/2020 4:31:09 PM Version By: Gretta Cool, BSN, RN, CWS, Kim RN, BSN Entered By: Gretta Cool, BSN, RN, CWS, Kim on 03/15/2020 16:58:34 [name] ([id number]) -------------------------------------------------------------------------------- Pain Assessment Details Patient Name: [name] Date of Service: 03/15/2020 3:15 PM Medical Record Number: [id number] Patient Account Number: [id number] Date of Birth/Sex: 1946-05-21 (74 y.o. F) Treating RN: Cornell Barman Primary Care Kilan Banfill: [name] Other Clinician: Referring Hanley Rispoli: [name] Treating Mackensey Bolte/Extender: Melburn Hake, HOYT Weeks in Treatment: 1 Active Problems Location of Pain Severity and Description of Pain Patient Has Paino Yes Site Locations Rate the pain. Current Pain Level: 8 Pain Management and Medication Current Pain Management: Notes Patient states it burns and stings and keeps her awake at night. Electronic Signature(s) Signed: 03/15/2020 4:30:58 PM By: Gretta Cool, BSN, RN, CWS, Kim  RN, BSN Entered By: Gretta Cool, BSN, RN, CWS, Kim on 03/15/2020 16:30:57 [name] ([id number]) -------------------------------------------------------------------------------- Patient/Caregiver Education Details Patient Name: [name] Date of Service: 03/15/2020 3:15 PM Medical Record Number: [id number] Patient Account Number: [id number] Date of Birth/Gender: 1946/06/15 (74 y.o. F) Treating RN: Cornell Barman Primary Care Physician: [name] Other Clinician: Referring Physician: [name] Treating Physician/Extender: Sharalyn Ink in Treatment: 1 Education Assessment Education Provided To: Patient Education Topics Provided Wound Debridement: Handouts: Wound Debridement Methods: Demonstration Responses: State content correctly Electronic Signature(s) Signed: 03/15/2020 5:43:28 PM By: Gretta Cool, BSN, RN, CWS, Kim RN, BSN Entered By: Gretta Cool, BSN, RN, CWS, Kim on 03/15/2020 17:02:27 [name] ([id number]) -------------------------------------------------------------------------------- Wound Assessment Details Patient Name: [name] Date of Service: 03/15/2020 3:15 PM Medical Record Number: [id number] Patient Account Number: [id number] Date of Birth/Sex: 09-07-1946 (74 y.o. F) Treating RN: Army Melia Primary Care Jolyssa Oplinger: [name] Other Clinician: Referring Devlyn Retter: [name] Treating Ashni Lonzo/Extender: STONE III, HOYT Weeks in Treatment: 1 Wound Status Wound Number: 1 Primary Venous Leg Ulcer Etiology: Wound Location: Left, Posterior Lower Leg Wound Open Wounding Event: Gradually Appeared Status: Date Acquired: 03/06/2020 Comorbid Cataracts, Congestive Heart Failure, Hypertension, Type II Weeks Of Treatment: 1 History: Diabetes, Osteoarthritis, Neuropathy, Received Clustered Wound: No Chemotherapy, Received Radiation Wound Measurements Length: (cm) 1.5 Width: (cm) 1.4 Depth: (cm) 0.1 Area: (cm) 1.649 Volume: (cm) 0.165 %  Reduction in Area: -34.6% % Reduction in Volume: -34.1% Epithelialization: None Wound Description Classification: Full Thickness Without Exposed Support Structu Wound Margin: Flat and Intact Exudate Amount: Medium Exudate Type: Serous Exudate Color: amber res Foul Odor After Cleansing: No Slough/Fibrino Yes Wound Bed Granulation Amount: None Present (0%) Exposed Structure Necrotic Amount: Large (67-100%) Fascia Exposed: No Necrotic Quality: Adherent Slough Fat Layer (Subcutaneous Tissue) Exposed: Yes Tendon Exposed: No Muscle Exposed: No Joint Exposed: No Bone Exposed: No Electronic Signature(s) Signed: 03/15/2020 4:55:37 PM By: Worthy Keeler PA-C Signed: 03/21/2020 4:08:07 PM By: Army Melia Entered By: Joaquim Lai  IIIMargarita Grizzle on 03/15/2020 16:12:00 [name], [name] ([id number]) -------------------------------------------------------------------------------- Vitals Details Patient Name: [name], [name] Date of Service: 03/15/2020 3:15 PM Medical Record Number: [id number] Patient Account Number: [id number] Date of Birth/Sex: [birth year] (74 y.o. F) Treating RN: Army Melia Primary Care Joanna Borawski: [name] Other Clinician: Referring Angeletta Goelz: [name] Treating Demita Tobia/Extender: STONE III, HOYT Weeks in Treatment: 1 Vital Signs Time Taken: 16:05 Temperature (F): 98.2 Height (in): 69 Pulse (bpm): 90 Weight (lbs): 263 Respiratory Rate (breaths/min): 20 Body Mass Index (BMI): 38.8 Blood Pressure (mmHg): 123/91 Reference Range: 80 - 120 mg / dl Electronic Signature(s) Signed: 03/15/2020 4:42:13 PM By: Lorine Bears RCP, RRT, CHT Entered By: Lorine Bears on 03/15/2020 16:06:56

## 2020-03-22 NOTE — Progress Notes (Signed)
[name], [name] ([id number]) Visit Report for 03/20/2020 Arrival Information Details Patient Name: [name], [name] Date of Service: 03/20/2020 1:30 PM Medical Record Number: [id number] Patient Account Number: [id number] Date of Birth/Sex: 16-Apr-1946 (74 y.o. F) Treating RN: Cornell Barman Primary Care Rodneisha Bonnet: [name] Other Clinician: Referring Mykiah Schmuck: [name] Treating Aaliah Jorgenson/Extender: Melburn Hake, HOYT Weeks in Treatment: 2 Visit Information History Since Last Visit Added or deleted any medications: No Patient Arrived: [name] Had a fall or experienced change in No Arrival Time: 13:00 activities of daily living that may affect Accompanied By: self risk of falls: Transfer Assistance: None Hospitalized since last visit: No Has Compression in Place as Prescribed: Yes Pain Present Now: Yes Electronic Signature(s) Signed: 03/21/2020 6:25:04 PM By: Gretta Cool, BSN, RN, CWS, Kim RN, BSN Entered By: Gretta Cool, BSN, RN, CWS, Kim on 03/20/2020 13:12:02 [name] ([id number]) -------------------------------------------------------------------------------- Compression Therapy Details Patient Name: [name] Date of Service: 03/20/2020 1:30 PM Medical Record Number: [id number] Patient Account Number: [id number] Date of Birth/Sex: November 04, 1945 (74 y.o. F) Treating RN: Cornell Barman Primary Care Lyrah Bradt: [name] Other Clinician: Referring Alfard Cochrane: [name] Treating Kivon Aprea/Extender: Melburn Hake, HOYT Weeks in Treatment: 2 Compression Therapy Performed for Wound Assessment: Wound #1 Left,Posterior Lower Leg Performed By: Clinician Cornell Barman, RN Compression Type: Three Layer Pre Treatment ABI: 1.2 Electronic Signature(s) Signed: 03/21/2020 6:25:04 PM By: Gretta Cool, BSN, RN, CWS, Kim RN, BSN Entered By: Gretta Cool, BSN, RN, CWS, Kim on 03/20/2020 13:38:04 [name] ([id number]) -------------------------------------------------------------------------------- Encounter  Discharge Information Details Patient Name: [name] Date of Service: 03/20/2020 1:30 PM Medical Record Number: [id number] Patient Account Number: [id number] Date of Birth/Sex: Sep 16, 1945 (74 y.o. F) Treating RN: Cornell Barman Primary Care Per Beagley: [name] Other Clinician: Referring Davarious Tumbleson: [name] Treating Amberley Hamler/Extender: Melburn Hake, HOYT Weeks in Treatment: 2 Encounter Discharge Information Items Discharge Condition: Stable Ambulatory Status: Walker Discharge Destination: Home Transportation: Other Accompanied By: self Schedule Follow-up Appointment: Yes Clinical Summary of Care: Electronic Signature(s) Signed: 03/21/2020 6:25:04 PM By: Gretta Cool, BSN, RN, CWS, Kim RN, BSN Entered By: Gretta Cool, BSN, RN, CWS, Kim on 03/20/2020 13:38:44 [name] ([id number]) -------------------------------------------------------------------------------- Pain Assessment Details Patient Name: [name] Date of Service: 03/20/2020 1:30 PM Medical Record Number: [id number] Patient Account Number: [id number] Date of Birth/Sex: [birth year] (74 y.o. F) Treating RN: Cornell Barman Primary Care Ceceilia Cephus: [name] Other Clinician: Referring Tykiera Raven: [name] Treating Kham Zuckerman/Extender: Melburn Hake, HOYT Weeks in Treatment: 2 Active Problems Location of Pain Severity and Description of Pain Patient Has Paino Yes Site Locations Pain Location: Pain in Ulcers With Dressing Change: Yes Rate the pain. Current Pain Level: 9 Character of Pain Describe the Pain: Burning, Throbbing Pain Management and Medication Current Pain Management: Rest: Yes Electronic Signature(s) Signed: 03/21/2020 6:25:04 PM By: Gretta Cool, BSN, RN, CWS, Kim RN, BSN Entered By: Gretta Cool, BSN, RN, CWS, Kim on 03/20/2020 13:11:54 [name] ([id number]) -------------------------------------------------------------------------------- Wound Assessment Details Patient Name: [name] Date of  Service: 03/20/2020 1:30 PM Medical Record Number: [id number] Patient Account Number: [id number] Date of Birth/Sex: 16-Jul-1946 (74 y.o. F) Treating RN: Cornell Barman Primary Care Darwyn Ponzo: [name] Other Clinician: Referring Leelynn Whetsel: [name] Treating Mariaelena Cade/Extender: STONE III, HOYT Weeks in Treatment: 2 Wound Status Wound Number: 1 Primary Venous Leg Ulcer Etiology: Wound Location: Left, Posterior Lower Leg Wound Open Wounding Event: Gradually Appeared Status: Date Acquired: 03/06/2020 Comorbid Cataracts, Congestive Heart Failure, Hypertension, Type Weeks Of Treatment: 2 History: II Diabetes, Osteoarthritis, Neuropathy, Received Clustered Wound: No Chemotherapy, Received Radiation Photos Wound Measurements Length: (cm) 1.5 Width: (cm) 1.5 Depth: (cm)  0.1 Area: (cm) 1.767 Volume: (cm) 0.177 % Reduction in Area: -44.2% % Reduction in Volume: -43.9% Epithelialization: None Tunneling: No Undermining: No Wound Description Classification: Full Thickness Without Exposed Support Structu Wound Margin: Flat and Intact Exudate Amount: Medium Exudate Type: Serous Exudate Color: amber res Foul Odor After Cleansing: No Slough/Fibrino Yes Wound Bed Granulation Amount: Small (1-33%) Exposed Structure Granulation Quality: Red Fascia Exposed: No Necrotic Amount: Medium (34-66%) Fat Layer (Subcutaneous Tissue) Exposed: Yes Necrotic Quality: Adherent Slough Tendon Exposed: No Muscle Exposed: No Joint Exposed: No Bone Exposed: No Treatment Notes Wound #1 (Left, Posterior Lower Leg) Notes Prisma, abd, 3 layer left with unna to [employer]) [name], [name] ([id number]) Signed: 03/21/2020 6:25:04 PM By: Gretta Cool, BSN, RN, CWS, Kim RN, BSN Entered By: Gretta Cool, BSN, RN, CWS, Kim on 03/20/2020 13:24:16

## 2020-03-26 NOTE — Progress Notes (Signed)
[name] ([id number]) Visit Report for 03/06/2020 Chief Complaint Document Details Patient Name: [name], [name] Date of Service: 03/06/2020 12:45 PM Medical Record Number: [id number] Patient Account Number: [id number] Date of Birth/Sex: 03/27/1946 (74 y.o. F) Treating RN: Cornell Barman Primary Care Provider: [name] Other Clinician: Referring Provider: [name] Treating Provider/Extender: Melburn Hake, Maryana Pittmon Weeks in Treatment: 0 Information Obtained from: Patient Chief Complaint Left LE Ulcer Electronic Signature(s) Signed: 03/06/2020 1:59:48 PM By: Worthy Keeler PA-C Entered By: Worthy Keeler on 03/06/2020 13:59:48 [name] ([id number]) -------------------------------------------------------------------------------- Debridement Details Patient Name: [name] Date of Service: 03/06/2020 12:45 PM Medical Record Number: [id number] Patient Account Number: [id number] Date of Birth/Sex: Sep 23, 1945 (74 y.o. F) Treating RN: Cornell Barman Primary Care Provider: [name] Other Clinician: Referring Provider: [name] Treating Provider/Extender: STONE III, Graesyn Schreifels Weeks in Treatment: 0 Debridement Performed for Wound #1 Left,Posterior Lower Leg Assessment: Performed By: Physician STONE III, Ravenna Legore E., PA-C Debridement Type: Debridement Severity of Tissue Pre Debridement: Fat layer exposed Level of Consciousness (Pre- Awake and Alert procedure): Pre-procedure Verification/Time Out Yes - 14:00 Taken: Start Time: 14:00 Pain Control: Lidocaine 4% Topical Solution Total Area Debrided (L x W): 1.3 (cm) x 1.2 (cm) = 1.56 (cm) Tissue and other material Viable, Non-Viable, Slough, Subcutaneous, Slough debrided: Level: Skin/Subcutaneous Tissue Debridement Description: Excisional Instrument: Curette Bleeding: Moderate Hemostasis Achieved: Pressure End Time: 14:05 Procedural Pain: 0 Post Procedural Pain: 0 Response to Treatment: Procedure was tolerated  well Level of Consciousness (Post- Awake and Alert procedure): Post Debridement Measurements of Total Wound Length: (cm) 1.3 Width: (cm) 1.2 Depth: (cm) 0.2 Volume: (cm) 0.245 Character of Wound/Ulcer Post Debridement: Improved Severity of Tissue Post Debridement: Fat layer exposed Post Procedure Diagnosis Same as Pre-procedure Electronic Signature(s) Signed: 03/06/2020 5:48:04 PM By: Gretta Cool, BSN, RN, CWS, Kim RN, BSN Signed: 03/26/2020 4:14:28 PM By: Worthy Keeler PA-C Entered By: Worthy Keeler on 03/06/2020 14:10:48 [name] ([id number]) -------------------------------------------------------------------------------- HPI Details Patient Name: [name] Date of Service: 03/06/2020 12:45 PM Medical Record Number: [id number] Patient Account Number: [id number] Date of Birth/Sex: [birth year] (74 y.o. F) Treating RN: Cornell Barman Primary Care Provider: [name] Other Clinician: Referring Provider: [name] Treating Provider/Extender: Melburn Hake, Arieonna Medine Weeks in Treatment: 0 History of Present Illness HPI Description: 03/15/2020 upon evaluation today patient presents today for initial evaluation here in our clinic concerning a wound that is on the left posterior lower extremity. Unfortunately this has been giving the patient some discomfort at this point which she notes has been affected her pretty much daily. The patient does have a history of venous insufficiency, hypertension, congestive heart failure, and a history of breast cancer. Fortunately there does not appear to be evidence of active infection at this time which is great news. No fevers, chills, nausea, vomiting, or diarrhea. Wound is not extremely large but does have some slough covering the surface of the wound. This is going require sharp debridement today. Electronic Signature(s) Signed: 03/15/2020 3:27:08 PM By: Worthy Keeler PA-C Entered By: Worthy Keeler on 03/15/2020 15:27:08 MALENY, CANDY  ([id number]) -------------------------------------------------------------------------------- Physical Exam Details Patient Name: [name] Date of Service: 03/06/2020 12:45 PM Medical Record Number: [id number] Patient Account Number: [id number] Date of Birth/Sex: [birth year] (74 y.o. F) Treating RN: Cornell Barman Primary Care Provider: [name] Other Clinician: Referring Provider: [name] Treating Provider/Extender: STONE III, Laraya Pestka Weeks in Treatment: 0 Constitutional sitting or standing blood pressure is within target range for patient.. pulse regular and within target range for patient.Marland Kitchen respirations regular,  non- labored and within target range for patient.Marland Kitchen temperature within target range for patient.. Well-nourished and well-hydrated in no acute distress. Eyes conjunctiva clear no eyelid edema noted. pupils equal round and reactive to light and accommodation. Ears, Nose, Mouth, and Throat no gross abnormality of ear auricles or external auditory canals. normal hearing noted during conversation. mucus membranes moist. Respiratory normal breathing without difficulty. Cardiovascular 2+ dorsalis pedis/posterior tibialis pulses. 1+ pitting edema of the bilateral lower extremities. Musculoskeletal normal gait and posture. no significant deformity or arthritic changes, no loss or range of motion, no clubbing. Psychiatric this patient is able to make decisions and demonstrates good insight into disease process. Alert and Oriented x 3. pleasant and cooperative. Notes Upon inspection patient's wound bed actually showed signs of some necrotic tissue on the surface of the wound which did require sharp debridement at this point. The patient actually tolerated the sharp debridement without complication post debridement the wound bed appears to be doing better which is excellent news. Electronic Signature(s) Signed: 03/15/2020 3:27:38 PM By: Worthy Keeler PA-C Entered By: Worthy Keeler on 03/15/2020 15:27:37 [name] ([id number]) -------------------------------------------------------------------------------- Physician Orders Details Patient Name: [name] Date of Service: 03/06/2020 12:45 PM Medical Record Number: [id number] Patient Account Number: [id number] Date of Birth/Sex: [birth year] (74 y.o. F) Treating RN: Cornell Barman Primary Care Provider: [name] Other Clinician: Referring Provider: [name] Treating Provider/Extender: Melburn Hake, Lynzy Rawles Weeks in Treatment: 0 Verbal / Phone Orders: No Diagnosis Coding ICD-10 Coding Code Description I87.2 Venous insufficiency (chronic) (peripheral) L97.822 Non-pressure chronic ulcer of other part of left lower leg with fat layer exposed I10 Essential (primary) hypertension I50.42 Chronic combined systolic (congestive) and diastolic (congestive) heart failure C50.919 Malignant neoplasm of unspecified site of unspecified female breast Wound Cleansing o Cleanse wound with mild soap and water Anesthetic (add to Medication List) Wound #1 Left,Posterior Lower Leg o Topical Lidocaine 4% cream applied to wound bed prior to debridement (In Clinic Only). Primary Wound Dressing Wound #1 Left,Posterior Lower Leg o Iodoflex Secondary Dressing Wound #1 Left,Posterior Lower Leg o ABD pad Dressing Change Frequency Wound #1 Left,Posterior Lower Leg o Change dressing every week - in clinic and nurse visit as needed Follow-up Appointments Wound #1 Left,Posterior Lower Leg o Return Appointment in 1 week. Edema Control Wound #1 Left,Posterior Lower Leg o 3 Layer Compression System - Left Lower Extremity - TCA to leg to help with itching o Elevate legs to the level of the heart and pump ankles as often as possible Additional Orders / Instructions Wound #1 Left,Posterior Lower Leg o Vitamin A; Vitamin C, Zinc o Increase protein intake. Electronic Signature(s) Signed: 03/26/2020 4:14:28 PM  By: Worthy Keeler PA-C Entered By: Worthy Keeler on 03/06/2020 14:09:31 [name] ([id number]) -------------------------------------------------------------------------------- Problem List Details Patient Name: [name] Date of Service: 03/06/2020 12:45 PM Medical Record Number: [id number] Patient Account Number: [id number] Date of Birth/Sex: April 06, 1946 (74 y.o. F) Treating RN: Cornell Barman Primary Care Provider: [name] Other Clinician: Referring Provider: [name] Treating Provider/Extender: Melburn Hake, Evamaria Detore Weeks in Treatment: 0 Active Problems ICD-10 Encounter Code Description Active Date MDM Diagnosis I87.2 Venous insufficiency (chronic) (peripheral) 03/06/2020 No Yes L97.822 Non-pressure chronic ulcer of other part of left lower leg with fat layer 03/06/2020 No Yes exposed I10 Essential (primary) hypertension 03/06/2020 No Yes I50.42 Chronic combined systolic (congestive) and diastolic (congestive) heart 03/06/2020 No Yes failure C50.919 Malignant neoplasm of unspecified site of unspecified female breast 03/06/2020 No Yes Inactive Problems Resolved Problems Electronic  Signature(s) Signed: 03/06/2020 1:59:31 PM By: Worthy Keeler PA-C Entered By: Worthy Keeler on 03/06/2020 13:59:31 [name] ([id number]) -------------------------------------------------------------------------------- Progress Note Details Patient Name: [name] Date of Service: 03/06/2020 12:45 PM Medical Record Number: [id number] Patient Account Number: [id number] Date of Birth/Sex: Jun 27, 1946 (74 y.o. F) Treating RN: Cornell Barman Primary Care Provider: [name] Other Clinician: Referring Provider: [name] Treating Provider/Extender: Melburn Hake, Moxon Messler Weeks in Treatment: 0 Subjective Chief Complaint Information obtained from Patient Left LE Ulcer History of Present Illness (HPI) 03/15/2020 upon evaluation today patient presents today for initial evaluation  here in our clinic concerning a wound that is on the left posterior lower extremity. Unfortunately this has been giving the patient some discomfort at this point which she notes has been affected her pretty much daily. The patient does have a history of venous insufficiency, hypertension, congestive heart failure, and a history of breast cancer. Fortunately there does not appear to be evidence of active infection at this time which is great news. No fevers, chills, nausea, vomiting, or diarrhea. Wound is not extremely large but does have some slough covering the surface of the wound. This is going require sharp debridement today. Patient History Allergies No Known Drug Allergies Family History Cancer - Siblings, No family history of Stroke. Social History Former smoker, Marital Status - Widowed, Alcohol Use - Never, Drug Use - No History, Caffeine Use - Never. Medical History Eyes Patient has history of Cataracts - left Cardiovascular Patient has history of Congestive Heart Failure, Hypertension Endocrine Patient has history of Type II Diabetes - Borderline 02/17/2020 A1c 6.3 Musculoskeletal Patient has history of Osteoarthritis Neurologic Patient has history of Neuropathy - hands Oncologic Patient has history of Received Chemotherapy - Chemo pills for right breast, Received Radiation Review of Systems (ROS) Eyes Complains or has symptoms of Glasses / Contacts. Denies complaints or symptoms of Vision Changes. Hematologic/Lymphatic Denies complaints or symptoms of Bleeding / Clotting Disorders, Human Immunodeficiency Virus. Respiratory Denies complaints or symptoms of Chronic or frequent coughs, Shortness of Breath. Cardiovascular Complains or has symptoms of LE edema. Denies complaints or symptoms of Chest pain. Gastrointestinal Denies complaints or symptoms of Frequent diarrhea, Nausea, Vomiting. Endocrine Denies complaints or symptoms of Hepatitis, Thyroid disease, Polydypsia  (Excessive Thirst). Genitourinary Complains or has symptoms of Incontinence/dribbling. Immunological Denies complaints or symptoms of Hives, Itching. Integumentary (Skin) Complains or has symptoms of Wounds. Musculoskeletal Denies complaints or symptoms of Muscle Pain, Muscle Weakness. Oncologic Breast Psychiatric Denies complaints or symptoms of Anxiety, Claustrophobia. Juntura, Latesa ([id number]) Objective Constitutional sitting or standing blood pressure is within target range for patient.. pulse regular and within target range for patient.Marland Kitchen respirations regular, non- labored and within target range for patient.Marland Kitchen temperature within target range for patient.. Well-nourished and well-hydrated in no acute distress. Vitals Time Taken: 1:10 PM, Height: 69 in, Source: Stated, Weight: 263 lbs, Source: Stated, BMI: 38.8, Temperature: 98.2 F, Pulse: 53 bpm, Respiratory Rate: 18 breaths/min, Blood Pressure: 135/45 mmHg. Eyes conjunctiva clear no eyelid edema noted. pupils equal round and reactive to light and accommodation. Ears, Nose, Mouth, and Throat no gross abnormality of ear auricles or external auditory canals. normal hearing noted during conversation. mucus membranes moist. Respiratory normal breathing without difficulty. Cardiovascular 2+ dorsalis pedis/posterior tibialis pulses. 1+ pitting edema of the bilateral lower extremities. Musculoskeletal normal gait and posture. no significant deformity or arthritic changes, no loss or range of motion, no clubbing. Psychiatric this patient is able to make decisions and demonstrates good insight into disease process.  Alert and Oriented x 3. pleasant and cooperative. General Notes: Upon inspection patient's wound bed actually showed signs of some necrotic tissue on the surface of the wound which did require sharp debridement at this point. The patient actually tolerated the sharp debridement without complication post debridement the  wound bed appears to be doing better which is excellent news. Integumentary (Hair, Skin) Wound #1 status is Open. Original cause of wound was Gradually Appeared. The wound is located on the Left,Posterior Lower Leg. The wound measures 1.3cm length x 1.2cm width x 0.1cm depth; 1.225cm^2 area and 0.123cm^3 volume. There is Fat Layer (Subcutaneous Tissue) Exposed exposed. There is no tunneling or undermining noted. There is a medium amount of serous drainage noted. The wound margin is flat and intact. There is no granulation within the wound bed. There is a large (67-100%) amount of necrotic tissue within the wound bed including Adherent Slough. Assessment Active Problems ICD-10 Venous insufficiency (chronic) (peripheral) Non-pressure chronic ulcer of other part of left lower leg with fat layer exposed Essential (primary) hypertension Chronic combined systolic (congestive) and diastolic (congestive) heart failure Malignant neoplasm of unspecified site of unspecified female breast Procedures Wound #1 Pre-procedure diagnosis of Wound #1 is a Venous Leg Ulcer located on the Left,Posterior Lower Leg .Severity of Tissue Pre Debridement is: Fat layer exposed. There was a Excisional Skin/Subcutaneous Tissue Debridement with a total area of 1.56 sq cm performed by STONE III, Dawanna Grauberger E., PA-C. With the following instrument(s): Curette to remove Viable and Non-Viable tissue/material. Material removed includes Subcutaneous Tissue and Slough and after achieving pain control using Lidocaine 4% Topical Solution. No specimens were taken. A time out was conducted at 14:00, [place], CLYDELL ([id number]) prior to the start of the procedure. A Moderate amount of bleeding was controlled with Pressure. The procedure was tolerated well with a pain level of 0 throughout and a pain level of 0 following the procedure. Post Debridement Measurements: 1.3cm length x 1.2cm width x 0.2cm depth; 0.245cm^3 volume. Character of  Wound/Ulcer Post Debridement is improved. Severity of Tissue Post Debridement is: Fat layer exposed. Post procedure Diagnosis Wound #1: Same as Pre-Procedure Plan Wound Cleansing: Cleanse wound with mild soap and water Anesthetic (add to Medication List): Wound #1 Left,Posterior Lower Leg: Topical Lidocaine 4% cream applied to wound bed prior to debridement (In Clinic Only). Primary Wound Dressing: Wound #1 Left,Posterior Lower Leg: Iodoflex Secondary Dressing: Wound #1 Left,Posterior Lower Leg: ABD pad Dressing Change Frequency: Wound #1 Left,Posterior Lower Leg: Change dressing every week - in clinic and nurse visit as needed Follow-up Appointments: Wound #1 Left,Posterior Lower Leg: Return Appointment in 1 week. Edema Control: Wound #1 Left,Posterior Lower Leg: 3 Layer Compression System - Left Lower Extremity - TCA to leg to help with itching Elevate legs to the level of the heart and pump ankles as often as possible Additional Orders / Instructions: Wound #1 Left,Posterior Lower Leg: Vitamin A; Vitamin C, Zinc Increase protein intake. 1. I would recommend currently that we go ahead and initiate treatment with a Iodoflex dressing to try to help clean up the wound I think that is good to be the ideal situation here 2. I am also can recommend that we actually initiate treatment with a 3 layer compression wrap to left lower extremity. We will use some triamcinolone to help with the itching on the leg hopefully this will be beneficial. 3. I am also can recommend that we have the patient elevate her legs is much as possible try to keep edema  under good control. Obviously I think this is still of utmost importance even with the rapid everything else. We will see patient back for reevaluation in 1 week here in the clinic. If anything worsens or changes patient will contact our office for additional recommendations. Electronic Signature(s) Signed: 03/15/2020 3:28:53 PM By: Worthy Keeler PA-C Entered By: Worthy Keeler on 03/15/2020 15:28:53 CLETIS, MUMA ([id number]) -------------------------------------------------------------------------------- ROS/PFSH Details Patient Name: [name] Date of Service: 03/06/2020 12:45 PM Medical Record Number: [id number] Patient Account Number: [id number] Date of Birth/Sex: 23-May-1946 (74 y.o. F) Treating RN: Cornell Barman Primary Care Provider: [name] Other Clinician: Referring Provider: [name] Treating Provider/Extender: STONE III, Nathin Saran Weeks in Treatment: 0 Eyes Complaints and Symptoms: Positive for: Glasses / Contacts Negative for: Vision Changes Medical History: Positive for: Cataracts - left Hematologic/Lymphatic Complaints and Symptoms: Negative for: Bleeding / Clotting Disorders; Human Immunodeficiency Virus Respiratory Complaints and Symptoms: Negative for: Chronic or frequent coughs; Shortness of Breath Cardiovascular Complaints and Symptoms: Positive for: LE edema Negative for: Chest pain Medical History: Positive for: Congestive Heart Failure; Hypertension Gastrointestinal Complaints and Symptoms: Negative for: Frequent diarrhea; Nausea; Vomiting Endocrine Complaints and Symptoms: Negative for: Hepatitis; Thyroid disease; Polydypsia (Excessive Thirst) Medical History: Positive for: Type II Diabetes - Borderline 02/17/2020 A1c 6.3 Genitourinary Complaints and Symptoms: Positive for: Incontinence/dribbling Immunological Complaints and Symptoms: Negative for: Hives; Itching Integumentary (Skin) Complaints and Symptoms: Positive for: Wounds Musculoskeletal EMILLY, LAVEY ([id number]) Complaints and Symptoms: Negative for: Muscle Pain; Muscle Weakness Medical History: Positive for: Osteoarthritis Psychiatric Complaints and Symptoms: Negative for: Anxiety; Claustrophobia Ear/Nose/Mouth/Throat Neurologic Medical History: Positive for: Neuropathy -  hands Oncologic Complaints and Symptoms: Review of System Notes: Breast Medical History: Positive for: Received Chemotherapy - Chemo pills for right breast; Received Radiation HBO Extended History Items Eyes: Cataracts Immunizations Pneumococcal Vaccine: Received Pneumococcal Vaccination: No Implantable Devices None Family and Social History Cancer: Yes - Siblings; Stroke: No; Former smoker; Marital Status - Widowed; Alcohol Use: Never; Drug Use: No History; Caffeine Use: Never Electronic Signature(s) Signed: 03/06/2020 5:48:04 PM By: Gretta Cool, BSN, RN, CWS, Kim RN, BSN Signed: 03/26/2020 4:14:28 PM By: Worthy Keeler PA-C Entered By: Gretta Cool BSN, RN, CWS, Kim on 03/06/2020 13:40:39 [name] ([id number]) -------------------------------------------------------------------------------- [place] Details Patient Name: [name] Date of Service: 03/06/2020 Medical Record Number: [id number] Patient Account Number: [id number] Date of Birth/Sex: 1946-04-03 (74 y.o. F) Treating RN: Cornell Barman Primary Care Provider: [name] Other Clinician: Referring Provider: [name] Treating Provider/Extender: Melburn Hake, Athalia Setterlund Weeks in Treatment: 0 Diagnosis Coding ICD-10 Codes Code Description I87.2 Venous insufficiency (chronic) (peripheral) L97.822 Non-pressure chronic ulcer of other part of left lower leg with fat layer exposed I10 Essential (primary) hypertension I50.42 Chronic combined systolic (congestive) and diastolic (congestive) heart failure C50.919 Malignant neoplasm of unspecified site of unspecified female breast Facility Procedures CPT4 Code: 24401027 Description: 99213 - WOUND CARE VISIT-LEV 3 EST PT Modifier: Quantity: 1 CPT4 Code: 25366440 Description: 34742 - DEB SUBQ TISSUE 20 SQ CM/< Modifier: Quantity: 1 CPT4 Code: Description: ICD-10 Diagnosis Description L97.822 Non-pressure chronic ulcer of other part of left lower leg with fat layer Modifier:  exposed Quantity: Physician Procedures CPT4 Code: 5956387 Description: WC PHYS LEVEL 3 o NEW PT Modifier: 25 Quantity: 1 CPT4 Code: Description: ICD-10 Diagnosis Description I87.2 Venous insufficiency (chronic) (peripheral) L97.822 Non-pressure chronic ulcer of other part of left lower leg with fat layer I10 Essential (primary) hypertension I50.42 Chronic combined systolic  (congestive) and diastolic (congestive) heart f Modifier: exposed ailure Quantity: CPT4 Code: 5643329  Description: 11042 - WC PHYS SUBQ TISS 20 SQ CM Modifier: Quantity: 1 CPT4 Code: Description: ICD-10 Diagnosis Description L97.822 Non-pressure chronic ulcer of other part of left lower leg with fat layer Modifier: exposed Quantity: Electronic Signature(s) Signed: 03/06/2020 5:21:49 PM By: Worthy Keeler PA-C Entered By: Worthy Keeler on 03/06/2020 17:21:49

## 2020-03-30 ENCOUNTER — Ambulatory Visit: Payer: Medicare HMO | Admitting: Physician Assistant

## 2020-04-02 ENCOUNTER — Other Ambulatory Visit: Payer: Self-pay

## 2020-04-02 ENCOUNTER — Encounter: Payer: Medicare HMO | Admitting: Physician Assistant

## 2020-04-02 DIAGNOSIS — E11622 Type 2 diabetes mellitus with other skin ulcer: Secondary | ICD-10-CM | POA: Diagnosis not present

## 2020-04-02 NOTE — Progress Notes (Addendum)
MYRTLE, HALLER ([id number]) Visit Report for 04/02/2020 Chief Complaint Document Details Patient Name: [name], [name] Date of Service: 04/02/2020 1:00 PM Medical Record Number: [id number] Patient Account Number: [id number] Date of Birth/Sex: 1946/07/19 (74 y.o. F) Treating RN: Cornell Barman Primary Care Provider: [name] Other Clinician: Referring Provider: [name] Treating Provider/Extender: Melburn Hake, Jayon Matton Weeks in Treatment: 3 Information Obtained from: Patient Chief Complaint Left LE Ulcer Electronic Signature(s) Signed: 04/02/2020 1:29:15 PM By: Worthy Keeler PA-C Entered By: Worthy Keeler on 04/02/2020 13:29:15 [name] ([id number]) -------------------------------------------------------------------------------- Debridement Details Patient Name: [name] Date of Service: 04/02/2020 1:00 PM Medical Record Number: [id number] Patient Account Number: [id number] Date of Birth/Sex: [birth year] (74 y.o. F) Treating RN: Cornell Barman Primary Care Provider: [name] Other Clinician: Referring Provider: [name] Treating Provider/Extender: Melburn Hake, Oscar Hank Weeks in Treatment: 3 Debridement Performed for Wound #1 Left,Posterior Lower Leg Assessment: Performed By: Physician STONE III, Cresencia Asmus E., PA-C Debridement Type: Debridement Severity of Tissue Pre Debridement: Fat layer exposed Level of Consciousness (Pre- Awake and Alert procedure): Pre-procedure Verification/Time Out Yes - 13:36 Taken: Pain Control: Lidocaine Total Area Debrided (L x W): 1.5 (cm) x 1.2 (cm) = 1.8 (cm) Tissue and other material Viable, Non-Viable, Slough, Subcutaneous, Slough debrided: Level: Skin/Subcutaneous Tissue Debridement Description: Excisional Instrument: Curette Bleeding: Minimum Hemostasis Achieved: Pressure Response to Treatment: Procedure was tolerated well Level of Consciousness (Post- Awake and Alert procedure): Post Debridement Measurements of Total  Wound Length: (cm) 1.5 Width: (cm) 1.2 Depth: (cm) 0.2 Volume: (cm) 0.283 Character of Wound/Ulcer Post Debridement: Stable Severity of Tissue Post Debridement: Fat layer exposed Post Procedure Diagnosis Same as Pre-procedure Electronic Signature(s) Signed: 04/02/2020 3:12:32 PM By: Worthy Keeler PA-C Signed: 04/02/2020 5:17:32 PM By: Gretta Cool, BSN, RN, CWS, Kim RN, BSN Entered By: Gretta Cool, BSN, RN, CWS, Kim on 04/02/2020 13:37:44 [name] ([id number]) -------------------------------------------------------------------------------- HPI Details Patient Name: [name] Date of Service: 04/02/2020 1:00 PM Medical Record Number: [id number] Patient Account Number: [id number] Date of Birth/Sex: 1945/09/12 (74 y.o. F) Treating RN: Cornell Barman Primary Care Provider: [name] Other Clinician: Referring Provider: [name] Treating Provider/Extender: Melburn Hake, Ramonte Mena Weeks in Treatment: 3 History of Present Illness HPI Description: 03/15/2020 upon evaluation today patient presents today for initial evaluation here in our clinic concerning a wound that is on the left posterior lower extremity. Unfortunately this has been giving the patient some discomfort at this point which she notes has been affected her pretty much daily. The patient does have a history of venous insufficiency, hypertension, congestive heart failure, and a history of breast cancer. Fortunately there does not appear to be evidence of active infection at this time which is great news. No fevers, chills, nausea, vomiting, or diarrhea. Wound is not extremely large but does have some slough covering the surface of the wound. This is going require sharp debridement today. 03/15/2020 upon evaluation today patient appears to be doing fairly well in regard to her wound. She does have some slough noted on the surface of the wound currently but I do believe the Iodoflex has been beneficial over the past week. There is no  signs of active infection at this time which is great news and overall very pleased with the progress. No fevers, chills, nausea, vomiting, or diarrhea. 04/02/2020 upon evaluation today patient appears to be doing a little better in regard to her wound size wise this is not tremendously smaller she does have some slough buildup on the surface of the wound. With that being said this  is going require some sharp debridement today to clear away some of the necrotic debris. Fortunately there is no evidence of active infection at this time. No fevers, chills, nausea, vomiting, or diarrhea. Electronic Signature(s) Signed: 04/02/2020 1:42:53 PM By: Worthy Keeler PA-C Entered By: Worthy Keeler on 04/02/2020 13:42:52 [name], [name] ([id number]) -------------------------------------------------------------------------------- Physical Exam Details Patient Name: [name] Date of Service: 04/02/2020 1:00 PM Medical Record Number: [id number] Patient Account Number: [id number] Date of Birth/Sex: August 18, 1946 (74 y.o. F) Treating RN: Cornell Barman Primary Care Provider: [name] Other Clinician: Referring Provider: [name] Treating Provider/Extender: STONE III, Avry Roedl Weeks in Treatment: 3 Constitutional Well-nourished and well-hydrated in no acute distress. Respiratory normal breathing without difficulty. Psychiatric this patient is able to make decisions and demonstrates good insight into disease process. Alert and Oriented x 3. pleasant and cooperative. Notes Upon inspection patient's wound bed actually showed signs of good granulation at this point fortunately there does not appear to be any evidence that this is in general worsening which is great news and overall I feel like the patient is tolerating the dressing changes without complication. Electronic Signature(s) Signed: 04/02/2020 1:43:32 PM By: Worthy Keeler PA-C Entered By: Worthy Keeler on 04/02/2020 13:43:31 [name] ([id number]) -------------------------------------------------------------------------------- Physician Orders Details Patient Name: [name] Date of Service: 04/02/2020 1:00 PM Medical Record Number: [id number] Patient Account Number: [id number] Date of Birth/Sex: 15-Dec-1945 (74 y.o. F) Treating RN: Cornell Barman Primary Care Provider: [name] Other Clinician: Referring Provider: [name] Treating Provider/Extender: Melburn Hake, Jerusalem Brownstein Weeks in Treatment: 3 Verbal / Phone Orders: No Diagnosis Coding ICD-10 Coding Code Description I87.2 Venous insufficiency (chronic) (peripheral) L97.822 Non-pressure chronic ulcer of other part of left lower leg with fat layer exposed I10 Essential (primary) hypertension I50.42 Chronic combined systolic (congestive) and diastolic (congestive) heart failure C50.919 Malignant neoplasm of unspecified site of unspecified female breast Wound Cleansing o Cleanse wound with mild soap and water Anesthetic (add to Medication List) Wound #1 Left,Posterior Lower Leg o Topical Lidocaine 4% cream applied to wound bed prior to debridement (In Clinic Only). Primary Wound Dressing Wound #1 Left,Posterior Lower Leg o Silver Collagen Secondary Dressing Wound #1 Left,Posterior Lower Leg o ABD pad Dressing Change Frequency Wound #1 Left,Posterior Lower Leg o Change dressing every week - in clinic and nurse visit as needed Follow-up Appointments Wound #1 Left,Posterior Lower Leg o Return Appointment in 1 week. Edema Control Wound #1 Left,Posterior Lower Leg o 3 Layer Compression System - Left Lower Extremity - TCA to leg to help with itching o Elevate legs to the level of the heart and pump ankles as often as possible Additional Orders / Instructions Wound #1 Left,Posterior Lower Leg o Vitamin A; Vitamin C, Zinc o Increase protein intake. Notes Gave measurements for stockings. Electronic Signature(s) Signed: 04/02/2020  3:12:32 PM By: Worthy Keeler PA-C Signed: 04/02/2020 5:17:32 PM By: Gretta Cool BSN, RN, CWS, Kim RN, BSN [place], [place] ([id number]) Entered By: Gretta Cool, BSN, RN, CWS, Kim on 04/02/2020 13:38:59 [name], [name] ([id number]) -------------------------------------------------------------------------------- Problem List Details Patient Name: [name] Date of Service: 04/02/2020 1:00 PM Medical Record Number: [id number] Patient Account Number: [id number] Date of Birth/Sex: 1945-12-10 (74 y.o. F) Treating RN: Cornell Barman Primary Care Provider: [name] Other Clinician: Referring Provider: [name] Treating Provider/Extender: Melburn Hake, Pranit Owensby Weeks in Treatment: 3 Active Problems ICD-10 Encounter Code Description Active Date MDM Diagnosis I87.2 Venous insufficiency (chronic) (peripheral) 03/06/2020 No Yes L97.822 Non-pressure chronic ulcer of other part of left lower leg with  fat layer 03/06/2020 No Yes exposed [place] (primary) hypertension 03/06/2020 No Yes I50.42 Chronic combined systolic (congestive) and diastolic (congestive) heart 03/06/2020 No Yes failure C50.919 Malignant neoplasm of unspecified site of unspecified female breast 03/06/2020 No Yes Inactive Problems Resolved Problems Electronic Signature(s) Signed: 04/02/2020 1:29:08 PM By: Worthy Keeler PA-C Entered By: Worthy Keeler on 04/02/2020 13:29:07 [name] ([id number]) -------------------------------------------------------------------------------- Progress Note Details Patient Name: [name] Date of Service: 04/02/2020 1:00 PM Medical Record Number: [id number] Patient Account Number: [id number] Date of Birth/Sex: Oct 30, 1945 (74 y.o. F) Treating RN: Cornell Barman Primary Care Provider: [name] Other Clinician: Referring Provider: [name] Treating Provider/Extender: Melburn Hake, Ladarion Munyon Weeks in Treatment: 3 Subjective Chief Complaint Information obtained from Patient Left LE  Ulcer History of Present Illness (HPI) 03/15/2020 upon evaluation today patient presents today for initial evaluation here in our clinic concerning a wound that is on the left posterior lower extremity. Unfortunately this has been giving the patient some discomfort at this point which she notes has been affected her pretty much daily. The patient does have a history of venous insufficiency, hypertension, congestive heart failure, and a history of breast cancer. Fortunately there does not appear to be evidence of active infection at this time which is great news. No fevers, chills, nausea, vomiting, or diarrhea. Wound is not extremely large but does have some slough covering the surface of the wound. This is going require sharp debridement today. 03/15/2020 upon evaluation today patient appears to be doing fairly well in regard to her wound. She does have some slough noted on the surface of the wound currently but I do believe the Iodoflex has been beneficial over the past week. There is no signs of active infection at this time which is great news and overall very pleased with the progress. No fevers, chills, nausea, vomiting, or diarrhea. 04/02/2020 upon evaluation today patient appears to be doing a little better in regard to her wound size wise this is not tremendously smaller she does have some slough buildup on the surface of the wound. With that being said this is going require some sharp debridement today to clear away some of the necrotic debris. Fortunately there is no evidence of active infection at this time. No fevers, chills, nausea, vomiting, or diarrhea. Objective Constitutional Well-nourished and well-hydrated in no acute distress. Vitals Time Taken: 1:15 AM, Height: 69 in, Weight: 263 lbs, BMI: 38.8, Temperature: 98.5 F, Pulse: 64 bpm, Respiratory Rate: 16 breaths/min, Blood Pressure: 138/55 mmHg. Respiratory normal breathing without difficulty. Psychiatric this patient is able to  make decisions and demonstrates good insight into disease process. Alert and Oriented x 3. pleasant and cooperative. General Notes: Upon inspection patient's wound bed actually showed signs of good granulation at this point fortunately there does not appear to be any evidence that this is in general worsening which is great news and overall I feel like the patient is tolerating the dressing changes without complication. Integumentary (Hair, Skin) Wound #1 status is Open. Original cause of wound was Gradually Appeared. The wound is located on the Left,Posterior Lower Leg. The wound measures 1.5cm length x 1.2cm width x 0.1cm depth; 1.414cm^2 area and 0.141cm^3 volume. There is Fat Layer (Subcutaneous Tissue) Exposed exposed. There is a medium amount of serous drainage noted. The wound margin is flat and intact. There is small (1-33%) red granulation within the wound bed. There is a medium (34-66%) amount of necrotic tissue within the wound bed including Adherent Slough. Assessment Active Problems ICD-10  Venous insufficiency (chronic) (peripheral) [name], [name] ([id number]) Non-pressure chronic ulcer of other part of left lower leg with fat layer exposed Essential (primary) hypertension Chronic combined systolic (congestive) and diastolic (congestive) heart failure Malignant neoplasm of unspecified site of unspecified female breast Procedures Wound #1 Pre-procedure diagnosis of Wound #1 is a Venous Leg Ulcer located on the Left,Posterior Lower Leg .Severity of Tissue Pre Debridement is: Fat layer exposed. There was a Excisional Skin/Subcutaneous Tissue Debridement with a total area of 1.8 sq cm performed by STONE III, Caius Silbernagel E., PA-C. With the following instrument(s): Curette to remove Viable and Non-Viable tissue/material. Material removed includes Subcutaneous Tissue and Slough and after achieving pain control using Lidocaine. No specimens were taken. A time out was conducted at 13:36, prior  to the start of the procedure. A Minimum amount of bleeding was controlled with Pressure. The procedure was tolerated well. Post Debridement Measurements: 1.5cm length x 1.2cm width x 0.2cm depth; 0.283cm^3 volume. Character of Wound/Ulcer Post Debridement is stable. Severity of Tissue Post Debridement is: Fat layer exposed. Post procedure Diagnosis Wound #1: Same as Pre-Procedure Pre-procedure diagnosis of Wound #1 is a Venous Leg Ulcer located on the Left,Posterior Lower Leg . There was a Three Layer Compression Therapy Procedure with a pre-treatment ABI of 1.2 by Cornell Barman, RN. Post procedure Diagnosis Wound #1: Same as Pre-Procedure Plan Wound Cleansing: Cleanse wound with mild soap and water Anesthetic (add to Medication List): Wound #1 Left,Posterior Lower Leg: Topical Lidocaine 4% cream applied to wound bed prior to debridement (In Clinic Only). Primary Wound Dressing: Wound #1 Left,Posterior Lower Leg: Silver Collagen Secondary Dressing: Wound #1 Left,Posterior Lower Leg: ABD pad Dressing Change Frequency: Wound #1 Left,Posterior Lower Leg: Change dressing every week - in clinic and nurse visit as needed Follow-up Appointments: Wound #1 Left,Posterior Lower Leg: Return Appointment in 1 week. Edema Control: Wound #1 Left,Posterior Lower Leg: 3 Layer Compression System - Left Lower Extremity - TCA to leg to help with itching Elevate legs to the level of the heart and pump ankles as often as possible Additional Orders / Instructions: Wound #1 Left,Posterior Lower Leg: Vitamin A; Vitamin C, Zinc Increase protein intake. General Notes: Gave measurements for stockings. 1. I am going to recommend at this point that we go ahead and initiate a continuation of the wound care measures as before with the silver collagen dressing I think that still a good option for the patient currently. 2. I am also can recommend at this point that we go ahead and continue with the 3 layer  compression wrap which I think is still going to be of utmost importance at this time. 3. I would also recommend that the patient continue to monitor for any signs of increased pain or worsening infection though I think right now the biggest issue is the swelling I did also suggest that she may need to talk to her primary care provider to see if there is anything else I can do to help out with her edema in the lower extremities. Possibly switching her Lasix to something else to bolster getting the fluid buildup down. We will see patient back for reevaluation in 1 week here in the clinic. If anything worsens or changes patient will contact our office for additional recommendations. [name], [name] ([id number]) Electronic Signature(s) Signed: 04/02/2020 1:45:30 PM By: Worthy Keeler PA-C Entered By: Worthy Keeler on 04/02/2020 13:45:30 [name] ([id number]) -------------------------------------------------------------------------------- SuperBill Details Patient Name: [name] Date of Service: 04/02/2020 Medical Record Number: [id number] Patient  Account Number: [id number] Date of Birth/Sex: 06-19-1946 (74 y.o. F) Treating RN: Cornell Barman Primary Care Provider: [name] Other Clinician: Referring Provider: [name] Treating Provider/Extender: Melburn Hake, Ginelle Bays Weeks in Treatment: 3 Diagnosis Coding ICD-10 Codes Code Description I87.2 Venous insufficiency (chronic) (peripheral) L97.822 Non-pressure chronic ulcer of other part of left lower leg with fat layer exposed I10 Essential (primary) hypertension I50.42 Chronic combined systolic (congestive) and diastolic (congestive) heart failure C50.919 Malignant neoplasm of unspecified site of unspecified female breast Facility Procedures CPT4 Code: [id number] Description: 48185 - DEB SUBQ TISSUE 20 SQ CM/< Modifier: Quantity: 1 CPT4 Code: Description: ICD-10 Diagnosis Description L97.822 Non-pressure chronic ulcer of  other part of left lower leg with fat layer Modifier: exposed Quantity: Physician Procedures CPT4 Code: [id number] Description: 26378 - WC PHYS SUBQ TISS 20 SQ CM Modifier: Quantity: 1 CPT4 Code: Description: ICD-10 Diagnosis Description L97.822 Non-pressure chronic ulcer of other part of left lower leg with fat layer Modifier: exposed Quantity: Electronic Signature(s) Signed: 04/02/2020 1:46:36 PM By: Worthy Keeler PA-C Entered By: Worthy Keeler on 04/02/2020 13:46:35

## 2020-04-03 NOTE — Progress Notes (Signed)
DANNIEL, GRENZ ([id number]) Visit Report for 04/02/2020 Arrival Information Details Patient Name: [name], [name] Date of Service: 04/02/2020 1:00 PM Medical Record Number: [id number] Patient Account Number: [id number] Date of Birth/Sex: 1946/05/30 (74 y.o. F) Treating RN: Cornell Barman Primary Care Alexxander Kurt: [name] Other Clinician: Referring British Moyd: [name] Treating Dorrie Cocuzza/Extender: Melburn Hake, HOYT Weeks in Treatment: 3 Visit Information History Since Last Visit Has Dressing in Place as Prescribed: Yes Patient Arrived: Ambulatory Has Compression in Place as Prescribed: Yes Arrival Time: 13:18 Pain Present Now: No Accompanied By: self Transfer Assistance: None Patient Identification Verified: Yes Secondary Verification Process Completed: Yes Patient Requires Transmission-Based Precautions: No Patient Has Alerts: No Electronic Signature(s) Signed: 04/03/2020 9:37:01 AM By: Darci Needle Entered By: Darci Needle on 04/02/2020 13:20:25 [name] ([id number]) -------------------------------------------------------------------------------- Compression Therapy Details Patient Name: [name] Date of Service: 04/02/2020 1:00 PM Medical Record Number: [id number] Patient Account Number: [id number] Date of Birth/Sex: 1946/01/12 (74 y.o. F) Treating RN: Cornell Barman Primary Care Taelar Gronewold: [name] Other Clinician: Referring Grover Woodfield: [name] Treating Nichoel Digiulio/Extender: Melburn Hake, HOYT Weeks in Treatment: 3 Compression Therapy Performed for Wound Assessment: Wound #1 Left,Posterior Lower Leg Performed By: Clinician Cornell Barman, RN Compression Type: Three Layer Pre Treatment ABI: 1.2 Post Procedure Diagnosis Same as Pre-procedure Electronic Signature(s) Signed: 04/02/2020 5:17:32 PM By: Gretta Cool, BSN, RN, CWS, Kim RN, BSN Entered By: Gretta Cool, BSN, RN, CWS, Kim on 04/02/2020 13:41:03 [name]  ([id number]) -------------------------------------------------------------------------------- Lower Extremity Assessment Details Patient Name: [name] Date of Service: 04/02/2020 1:00 PM Medical Record Number: [id number] Patient Account Number: [id number] Date of Birth/Sex: 30-Apr-1946 (74 y.o. F) Treating RN: Cornell Barman Primary Care Benjamim Harnish: [name] Other Clinician: Referring Shenell Rogalski: [name] Treating Bardia Wangerin/Extender: Melburn Hake, HOYT Weeks in Treatment: 3 Edema Assessment Assessed: [Left: No] [Right: No] [Left: Edema] [Right: :] Calf Left: Right: Point of Measurement: cm From Medial Instep 41 cm cm Ankle Left: Right: Point of Measurement: cm From Medial Instep 26 cm cm Vascular Assessment Pulses: Dorsalis Pedis Palpable: [Left:Yes] Posterior Tibial Palpable: [Left:Yes] Electronic Signature(s) Signed: 04/02/2020 5:17:32 PM By: Gretta Cool, BSN, RN, CWS, Kim RN, BSN Signed: 04/03/2020 9:37:01 AM By: Darci Needle Entered By: Darci Needle on 04/02/2020 13:25:42 [name] ([id number]) -------------------------------------------------------------------------------- Multi Wound Chart Details Patient Name: [name] Date of Service: 04/02/2020 1:00 PM Medical Record Number: [id number] Patient Account Number: [id number] Date of Birth/Sex: Jan 14, 1946 (74 y.o. F) Treating RN: Cornell Barman Primary Care Renalda Locklin: [name] Other Clinician: Referring Delona Clasby: [name] Treating Maryori Weide/Extender: STONE III, HOYT Weeks in Treatment: 3 Vital Signs Height(in): 69 Pulse(bpm): 81 Weight(lbs): 263 Blood Pressure(mmHg): 138/55 Body Mass Index(BMI): 39 Temperature(F): 98.5 Respiratory Rate(breaths/min): 16 Photos: [N/A:N/A] Wound Location: Left, Posterior Lower Leg N/A N/A Wounding Event: Gradually Appeared N/A N/A Primary Etiology: Venous Leg Ulcer N/A N/A Comorbid History: Cataracts, Congestive Heart Failure, N/A N/A Hypertension, Type II  Diabetes, Osteoarthritis, Neuropathy, Received Chemotherapy, Received Radiation Date Acquired: 03/06/2020 N/A N/A Weeks of Treatment: 3 N/A N/A Wound Status: Open N/A N/A Measurements L x W x D (cm) 1.5x1.2x0.1 N/A N/A Area (cm) : 1.414 N/A N/A Volume (cm) : 0.141 N/A N/A % Reduction in Area: -15.40% N/A N/A % Reduction in Volume: -14.60% N/A N/A Classification: Full Thickness Without Exposed N/A N/A Support Structures Exudate Amount: Medium N/A N/A Exudate Type: Serous N/A N/A Exudate Color: amber N/A N/A Wound Margin: Flat and Intact N/A N/A Granulation Amount: Small (1-33%) N/A N/A Granulation Quality: Red N/A N/A Necrotic Amount: Medium (34-66%) N/A N/A Exposed Structures: Fat Layer (Subcutaneous Tissue) N/A  N/A Exposed: Yes Fascia: No Tendon: No Muscle: No Joint: No Bone: No Epithelialization: None N/A N/A Treatment Notes Electronic Signature(s) Signed: 04/02/2020 5:17:32 PM By: Gretta Cool, BSN, RN, CWS, Kim RN, BSN Entered By: Gretta Cool, BSN, RN, CWS, Kim on 04/02/2020 13:36:14 IXEL, BOEHNING ([id number]) NICOLASA, MILBRATH ([id number]) -------------------------------------------------------------------------------- Multi-Disciplinary Care Plan Details Patient Name: [name] Date of Service: 04/02/2020 1:00 PM Medical Record Number: [id number] Patient Account Number: [id number] Date of Birth/Sex: [birth year] (74 y.o. F) Treating RN: Cornell Barman Primary Care Dariana Garbett: [name] Other Clinician: Referring Taheera Thomann: [name] Treating Jalisia Puchalski/Extender: Melburn Hake, HOYT Weeks in Treatment: 3 Active Inactive Necrotic Tissue Nursing Diagnoses: Impaired tissue integrity related to necrotic/devitalized tissue Knowledge deficit related to management of necrotic/devitalized tissue Goals: Necrotic/devitalized tissue will be minimized in the wound bed Date Initiated: 03/06/2020 Target Resolution Date: 04/05/2020 Goal Status: Active Patient/caregiver will verbalize  understanding of reason and process for debridement of necrotic tissue Date Initiated: 03/06/2020 Target Resolution Date: 03/06/2020 Goal Status: Active Interventions: Assess patient pain level pre-, during and post procedure and prior to discharge Provide education on necrotic tissue and debridement process Treatment Activities: Apply topical anesthetic as ordered : 03/06/2020 Notes: Nutrition Nursing Diagnoses: Impaired glucose control: actual or potential Goals: Patient/caregiver verbalizes understanding of need to maintain therapeutic glucose control per primary care physician Date Initiated: 03/06/2020 Target Resolution Date: 04/05/2020 Goal Status: Active Interventions: Provide education on elevated blood sugars and impact on wound healing Treatment Activities: Patient referred to Primary Care Physician for further nutritional evaluation : 03/06/2020 Notes: Orientation to the Wound Care Program Nursing Diagnoses: Knowledge deficit related to the wound healing center program Goals: Patient/caregiver will verbalize understanding of the [place] Program Date Initiated: 03/06/2020 Target Resolution Date: 04/05/2020 Goal Status: Active Interventions: Provide education on orientation to the wound center Notes: MILAN, CLARE ([id number]) Pain, Acute or Chronic Nursing Diagnoses: Pain Management - Non-cyclic Acute (Procedural) Pain, acute or chronic: actual or potential Goals: Patient will verbalize adequate pain control and receive pain control interventions during procedures as needed Date Initiated: 03/06/2020 Target Resolution Date: 04/05/2020 Goal Status: Active Interventions: Reposition patient for comfort Notes: Venous Leg Ulcer Nursing Diagnoses: Potential for venous Insuffiency (use before diagnosis confirmed) Goals: Patient will maintain optimal edema control Date Initiated: 03/06/2020 Target Resolution Date: 04/05/2020 Goal Status:  Active Interventions: Assess peripheral edema status every visit. Compression as ordered Treatment Activities: Therapeutic compression applied : 03/06/2020 Notes: Wound/Skin Impairment Nursing Diagnoses: Impaired tissue integrity Goals: Patient/caregiver will verbalize understanding of skin care regimen Date Initiated: 03/06/2020 Target Resolution Date: 04/05/2020 Goal Status: Active Ulcer/skin breakdown will have a volume reduction of 30% by week 4 Date Initiated: 03/06/2020 Target Resolution Date: 04/05/2020 Goal Status: Active Interventions: Assess ulceration(s) every visit Treatment Activities: Skin care regimen initiated : 03/06/2020 Notes: Electronic Signature(s) Signed: 04/02/2020 5:17:32 PM By: Gretta Cool, BSN, RN, CWS, Kim RN, BSN Entered By: Gretta Cool, BSN, RN, CWS, Kim on 04/02/2020 13:36:05 [name] ([id number]) -------------------------------------------------------------------------------- Pain Assessment Details Patient Name: [name] Date of Service: 04/02/2020 1:00 PM Medical Record Number: [id number] Patient Account Number: [id number] Date of Birth/Sex: 22-May-1946 (74 y.o. F) Treating RN: Cornell Barman Primary Care Andria Head: [name] Other Clinician: Referring Davie Sagona: [name] Treating Elber Galyean/Extender: Melburn Hake, HOYT Weeks in Treatment: 3 Active Problems Location of Pain Severity and Description of Pain Patient Has Paino No Site Locations With Dressing Change: No Pain Management and Medication Current Pain Management: Electronic Signature(s) Signed: 04/02/2020 5:17:32 PM By: Gretta Cool, BSN, RN, CWS, Kim RN, BSN Signed: 04/03/2020 9:37:01 AM By:  Darci Needle Entered By: Darci Needle on 04/02/2020 13:22:06 [name] ([id number]) -------------------------------------------------------------------------------- Patient/Caregiver Education Details Patient Name: [name] Date of Service: 04/02/2020 1:00 PM Medical Record  Number: [id number] Patient Account Number: [id number] Date of Birth/Gender: 1946-07-25 (74 y.o. F) Treating RN: Cornell Barman Primary Care Physician: [name] Other Clinician: Referring Physician: [name] Treating Physician/Extender: Sharalyn Ink in Treatment: 3 Education Assessment Education Provided To: Patient Education Topics Provided Wound Debridement: Handouts: Wound Debridement, Other: Pain reliever every 4 hours if needed (over the counter) Methods: Demonstration, Explain/Verbal Responses: State content correctly Electronic Signature(s) Signed: 04/02/2020 5:17:32 PM By: Gretta Cool, BSN, RN, CWS, Kim RN, BSN Entered By: Gretta Cool, BSN, RN, CWS, Kim on 04/02/2020 13:41:46 [name] ([id number]) -------------------------------------------------------------------------------- Wound Assessment Details Patient Name: [name] Date of Service: 04/02/2020 1:00 PM Medical Record Number: [id number] Patient Account Number: [id number] Date of Birth/Sex: [birth year] (74 y.o. F) Treating RN: Cornell Barman Primary Care Nizar Cutler: [name] Other Clinician: Referring Dominik Lauricella: [name] Treating Shaasia Odle/Extender: Melburn Hake, HOYT Weeks in Treatment: 3 Wound Status Wound Number: 1 Primary Venous Leg Ulcer Etiology: Wound Location: Left, Posterior Lower Leg Wound Open Wounding Event: Gradually Appeared Status: Date Acquired: 03/06/2020 Comorbid Cataracts, Congestive Heart Failure, Hypertension, Type II Weeks Of Treatment: 3 History: Diabetes, Osteoarthritis, Neuropathy, Received Clustered Wound: No Chemotherapy, Received Radiation Photos Wound Measurements Length: (cm) 1.5 Width: (cm) 1.2 Depth: (cm) 0.1 Area: (cm) 1.414 Volume: (cm) 0.141 % Reduction in Area: -15.4% % Reduction in Volume: -14.6% Epithelialization: None Wound Description Classification: Full Thickness Without Exposed Support Structu Wound Margin: Flat and Intact Exudate Amount:  Medium Exudate Type: Serous Exudate Color: amber res Foul Odor After Cleansing: No Slough/Fibrino Yes Wound Bed Granulation Amount: Small (1-33%) Exposed Structure Granulation Quality: Red Fascia Exposed: No Necrotic Amount: Medium (34-66%) Fat Layer (Subcutaneous Tissue) Exposed: Yes Necrotic Quality: Adherent Slough Tendon Exposed: No Muscle Exposed: No Joint Exposed: No Bone Exposed: No Electronic Signature(s) Signed: 04/02/2020 5:17:32 PM By: Gretta Cool, BSN, RN, CWS, Kim RN, BSN Signed: 04/03/2020 9:37:01 AM By: Darci Needle Entered By: Darci Needle on 04/02/2020 13:27:34 [name] ([id number]) -------------------------------------------------------------------------------- [place] Details Patient Name: [name] Date of Service: 04/02/2020 1:00 PM Medical Record Number: [id number] Patient Account Number: [id number] Date of Birth/Sex: Jun 22, 1946 (74 y.o. F) Treating RN: Cornell Barman Primary Care Anajulia Leyendecker: [name] Other Clinician: Referring Rachna Schonberger: [name] Treating Phinneas Shakoor/Extender: Melburn Hake, HOYT Weeks in Treatment: 3 Vital Signs Time Taken: 01:15 Temperature (F): 98.5 Height (in): 69 Pulse (bpm): 64 Weight (lbs): 263 Respiratory Rate (breaths/min): 16 Body Mass Index (BMI): 38.8 Blood Pressure (mmHg): 138/55 Reference Range: 80 - 120 mg / dl Electronic Signature(s) Signed: 04/03/2020 9:37:01 AM By: Darci Needle Entered By: Darci Needle on 04/02/2020 13:21:43

## 2020-04-10 ENCOUNTER — Other Ambulatory Visit: Payer: Self-pay

## 2020-04-10 ENCOUNTER — Encounter: Payer: Medicare HMO | Attending: Physician Assistant | Admitting: Physician Assistant

## 2020-04-10 DIAGNOSIS — I5042 Chronic combined systolic (congestive) and diastolic (congestive) heart failure: Secondary | ICD-10-CM | POA: Diagnosis not present

## 2020-04-10 DIAGNOSIS — L97822 Non-pressure chronic ulcer of other part of left lower leg with fat layer exposed: Secondary | ICD-10-CM | POA: Insufficient documentation

## 2020-04-10 DIAGNOSIS — I872 Venous insufficiency (chronic) (peripheral): Secondary | ICD-10-CM | POA: Insufficient documentation

## 2020-04-10 DIAGNOSIS — I110 Hypertensive heart disease with heart failure: Secondary | ICD-10-CM | POA: Insufficient documentation

## 2020-04-10 NOTE — Progress Notes (Addendum)
[name] ([id number]) Visit Report for 04/10/2020 Chief Complaint Document Details Patient Name: [name] Date of Service: 04/10/2020 1:00 PM Medical Record Number: [id number] Patient Account Number: [id number] Date of Birth/Sex: Jan 26, 1946 (74 y.o. F) Treating RN: Cornell Barman Primary Care Provider: [name] Other Clinician: Referring Provider: [name] Treating Provider/Extender: [name], [name] in Treatment: 5 Information Obtained from: Patient Chief Complaint Left LE Ulcer Electronic Signature(s) Signed: 04/10/2020 1:46:44 PM By: Worthy Keeler PA-C Entered By: Worthy Keeler on 04/10/2020 13:46:43 [name] ([id number]) -------------------------------------------------------------------------------- HPI Details Patient Name: [name] Date of Service: 04/10/2020 1:00 PM Medical Record Number: [id number] Patient Account Number: [id number] Date of Birth/Sex: May 04, 1946 (74 y.o. F) Treating RN: Cornell Barman Primary Care Provider: [name] Other Clinician: Referring Provider: [name] Treating Provider/Extender: [name], Nicolaos Mitrano [name] in Treatment: 5 History of Present Illness HPI Description: 03/15/2020 upon evaluation today patient presents today for initial evaluation here in our clinic concerning a wound that is on the left posterior lower extremity. Unfortunately this has been giving the patient some discomfort at this point which she notes has been affected her pretty much daily. The patient does have a history of venous insufficiency, hypertension, congestive heart failure, and a history of breast cancer. Fortunately there does not appear to be evidence of active infection at this time which is great news. No fevers, chills, nausea, vomiting, or diarrhea. Wound is not extremely large but does have some slough covering the surface of the wound. This is going require sharp debridement today. 03/15/2020 upon evaluation today patient appears  to be doing fairly well in regard to her wound. She does have some slough noted on the surface of the wound currently but I do believe the Iodoflex has been beneficial over the past week. There is no signs of active infection at this time which is great news and overall very pleased with the progress. No fevers, chills, nausea, vomiting, or diarrhea. 04/02/2020 upon evaluation today patient appears to be doing a little better in regard to her wound size wise this is not tremendously smaller she does have some slough buildup on the surface of the wound. With that being said this is going require some sharp debridement today to clear away some of the necrotic debris. Fortunately there is no evidence of active infection at this time. No fevers, chills, nausea, vomiting, or diarrhea. 04/10/2020 on evaluation today patient appears to be doing well with regard to her ulcer which is measuring somewhat smaller today. She actually has more granulation tissue noted which is also good news is no need for sharp debridement today. Fortunately there is no evidence of infection either which is also excellent. No fevers, chills, nausea, vomiting, or diarrhea. Electronic Signature(s) Signed: 04/10/2020 1:52:09 PM By: Worthy Keeler PA-C Entered By: Worthy Keeler on 04/10/2020 13:52:09 [name] ([id number]) -------------------------------------------------------------------------------- Physical Exam Details Patient Name: [name] Date of Service: 04/10/2020 1:00 PM Medical Record Number: [id number] Patient Account Number: [id number] Date of Birth/Sex: 10-Nov-1945 (74 y.o. F) Treating RN: Cornell Barman Primary Care Provider: [name] Other Clinician: Referring Provider: [name] Treating Provider/Extender: STONE III, Nataliee Shurtz [name] in Treatment: 5 Constitutional Well-nourished and well-hydrated in no acute distress. Respiratory normal breathing without difficulty. Psychiatric this patient is  able to make decisions and demonstrates good insight into disease process. Alert and Oriented x 3. pleasant and cooperative. Notes Upon inspection today patient's wound bed actually showed signs of good granulation at this time there does not appear to  be any evidence of active infection and overall very pleased in that regard. I do believe that the edema is still somewhat poorly controlled she does have an appointment however with her primary care provider on August 17 due to the extent of the swelling that she has been experiencing. Electronic Signature(s) Signed: 04/10/2020 1:54:18 PM By: Worthy Keeler PA-C Entered By: Worthy Keeler on 04/10/2020 13:54:17 [name] ([id number]) -------------------------------------------------------------------------------- Physician Orders Details Patient Name: [name] Date of Service: 04/10/2020 1:00 PM Medical Record Number: [id number] Patient Account Number: [id number] Date of Birth/Sex: 1945-10-01 (74 y.o. F) Treating RN: Grover Canavan Primary Care Provider: [name] Other Clinician: Referring Provider: [name] Treating Provider/Extender: [name], Nadie Fiumara [name] in Treatment: 5 Verbal / Phone Orders: No Diagnosis Coding ICD-10 Coding Code Description I87.2 Venous insufficiency (chronic) (peripheral) L97.822 Non-pressure chronic ulcer of other part of left lower leg with fat layer exposed I10 Essential (primary) hypertension I50.42 Chronic combined systolic (congestive) and diastolic (congestive) heart failure C50.919 Malignant neoplasm of unspecified site of unspecified female breast Wound Cleansing o Cleanse wound with mild soap and water Anesthetic (add to Medication List) Wound #1 Left,Posterior Lower Leg o Topical Lidocaine 4% cream applied to wound bed prior to debridement (In Clinic Only). Primary Wound Dressing Wound #1 Left,Posterior Lower Leg o Silver Collagen Secondary Dressing Wound #1  Left,Posterior Lower Leg o ABD pad Dressing Change Frequency Wound #1 Left,Posterior Lower Leg o Change dressing every week - in clinic and nurse visit as needed Follow-up Appointments Wound #1 Left,Posterior Lower Leg o Return Appointment in 1 week. Edema Control Wound #1 Left,Posterior Lower Leg o 3 Layer Compression System - Left Lower Extremity - TCA to leg to help with itching o Elevate legs to the level of the heart and pump ankles as often as possible Additional Orders / Instructions Wound #1 Left,Posterior Lower Leg o Vitamin A; Vitamin C, Zinc o Increase protein intake. Notes [facility] appointment Apr 24, 2020 Electronic Signature(s) Signed: 04/10/2020 5:29:55 PM By: Grover Canavan Signed: 04/10/2020 6:15:25 PM By: Waynard Reeds, Anadalay ([id number]) Entered By: Grover Canavan on 04/10/2020 13:51:30 DIMPLE, BASTYR ([id number]) -------------------------------------------------------------------------------- Problem List Details Patient Name: [name] Date of Service: 04/10/2020 1:00 PM Medical Record Number: [id number] Patient Account Number: [id number] Date of Birth/Sex: 06/19/1946 (74 y.o. F) Treating RN: Cornell Barman Primary Care Provider: [name] Other Clinician: Referring Provider: [name] Treating Provider/Extender: [name], Trentin Knappenberger [name] in Treatment: 5 Active Problems ICD-10 Encounter Code Description Active Date MDM Diagnosis I87.2 Venous insufficiency (chronic) (peripheral) 03/06/2020 No Yes L97.822 Non-pressure chronic ulcer of other part of left lower leg with fat layer 03/06/2020 No Yes exposed I10 Essential (primary) hypertension 03/06/2020 No Yes I50.42 Chronic combined systolic (congestive) and diastolic (congestive) heart 03/06/2020 No Yes failure C50.919 Malignant neoplasm of unspecified site of unspecified female breast 03/06/2020 No Yes Inactive Problems Resolved Problems Electronic  Signature(s) Signed: 04/10/2020 1:46:38 PM By: Worthy Keeler PA-C Entered By: Worthy Keeler on 04/10/2020 13:46:38 [name] ([id number]) -------------------------------------------------------------------------------- Progress Note Details Patient Name: [name] Date of Service: 04/10/2020 1:00 PM Medical Record Number: [id number] Patient Account Number: [id number] Date of Birth/Sex: 09-13-1945 (74 y.o. F) Treating RN: Cornell Barman Primary Care Provider: [name] Other Clinician: Referring Provider: [name] Treating Provider/Extender: [name], Evelyne Makepeace [name] in Treatment: 5 Subjective Chief Complaint Information obtained from Patient Left LE Ulcer History of Present Illness (HPI) 03/15/2020 upon evaluation today patient presents today for initial evaluation here in our clinic  concerning a wound that is on the left posterior lower extremity. Unfortunately this has been giving the patient some discomfort at this point which she notes has been affected her pretty much daily. The patient does have a history of venous insufficiency, hypertension, congestive heart failure, and a history of breast cancer. Fortunately there does not appear to be evidence of active infection at this time which is great news. No fevers, chills, nausea, vomiting, or diarrhea. Wound is not extremely large but does have some slough covering the surface of the wound. This is going require sharp debridement today. 03/15/2020 upon evaluation today patient appears to be doing fairly well in regard to her wound. She does have some slough noted on the surface of the wound currently but I do believe the Iodoflex has been beneficial over the past week. There is no signs of active infection at this time which is great news and overall very pleased with the progress. No fevers, chills, nausea, vomiting, or diarrhea. 04/02/2020 upon evaluation today patient appears to be doing a little better in regard to her  wound size wise this is not tremendously smaller she does have some slough buildup on the surface of the wound. With that being said this is going require some sharp debridement today to clear away some of the necrotic debris. Fortunately there is no evidence of active infection at this time. No fevers, chills, nausea, vomiting, or diarrhea. 04/10/2020 on evaluation today patient appears to be doing well with regard to her ulcer which is measuring somewhat smaller today. She actually has more granulation tissue noted which is also good news is no need for sharp debridement today. Fortunately there is no evidence of infection either which is also excellent. No fevers, chills, nausea, vomiting, or diarrhea. Objective Constitutional Well-nourished and well-hydrated in no acute distress. Vitals Time Taken: 1:12 PM, Height: 69 in, Weight: 263 lbs, BMI: 38.8, Temperature: 98.3 F, Pulse: 58 bpm, Respiratory Rate: 18 breaths/min, Blood Pressure: 119/89 mmHg. Respiratory normal breathing without difficulty. Psychiatric this patient is able to make decisions and demonstrates good insight into disease process. Alert and Oriented x 3. pleasant and cooperative. General Notes: Upon inspection today patient's wound bed actually showed signs of good granulation at this time there does not appear to be any evidence of active infection and overall very pleased in that regard. I do believe that the edema is still somewhat poorly controlled she does have an appointment however with her primary care provider on August 17 due to the extent of the swelling that she has been experiencing. Integumentary (Hair, Skin) Wound #1 status is Open. Original cause of wound was Gradually Appeared. The wound is located on the Left,Posterior Lower Leg. The wound measures 1.2cm length x 1.2cm width x 0.1cm depth; 1.131cm^2 area and 0.113cm^3 volume. There is Fat Layer (Subcutaneous Tissue) Exposed exposed. There is no tunneling or  undermining noted. There is a medium amount of serous drainage noted. The wound margin is flat and intact. There is medium (34-66%) red granulation within the wound bed. There is a small (1-33%) amount of necrotic tissue within the wound bed including Adherent Slough. BISMA, KLETT ([id number]) Assessment Active Problems ICD-10 Venous insufficiency (chronic) (peripheral) Non-pressure chronic ulcer of other part of left lower leg with fat layer exposed Essential (primary) hypertension Chronic combined systolic (congestive) and diastolic (congestive) heart failure Malignant neoplasm of unspecified site of unspecified female breast Procedures Wound #1 Pre-procedure diagnosis of Wound #1 is a Venous Leg Ulcer located on the  Left,Posterior Lower Leg . There was a Three Layer Compression Therapy Procedure with a pre-treatment ABI of 1.2 by Grover Canavan, RN. Post procedure Diagnosis Wound #1: Same as Pre-Procedure Plan Wound Cleansing: Cleanse wound with mild soap and water Anesthetic (add to Medication List): Wound #1 Left,Posterior Lower Leg: Topical Lidocaine 4% cream applied to wound bed prior to debridement (In Clinic Only). Primary Wound Dressing: Wound #1 Left,Posterior Lower Leg: Silver Collagen Secondary Dressing: Wound #1 Left,Posterior Lower Leg: ABD pad Dressing Change Frequency: Wound #1 Left,Posterior Lower Leg: Change dressing every week - in clinic and nurse visit as needed Follow-up Appointments: Wound #1 Left,Posterior Lower Leg: Return Appointment in 1 week. Edema Control: Wound #1 Left,Posterior Lower Leg: 3 Layer Compression System - Left Lower Extremity - TCA to leg to help with itching Elevate legs to the level of the heart and pump ankles as often as possible Additional Orders / Instructions: Wound #1 Left,Posterior Lower Leg: Vitamin A; Vitamin C, Zinc Increase protein intake. General Notes: [facility] appointment Apr 24, 2020 1. I would  recommend currently that we go ahead and initiate a continuation of treatment with the silver collagen dressing I do believe this is helping her. 2. I am also can recommend the patient continue with elevation of her legs as well as the 3 layer compression wrap to try to keep her edema under control for the time being. 3. She does have an appointment for follow-up with her primary care provider in April 24, 2020 in order to discuss management of her fluid and see if there is anything they can do to help her out. 4. I am also can recommend that the patient continue as much as possible and ambulate more she can walk I think this will help pump some of the edema out of her legs although obviously I think she also needs to elevate her legs when she is at rest. We will see patient back for reevaluation in 1 week here in the clinic. If anything worsens or changes patient will contact our office for additional recommendations. HILDRETH, ORSAK ([id number]) Electronic Signature(s) Signed: 04/10/2020 1:55:11 PM By: Worthy Keeler PA-C Entered By: Worthy Keeler on 04/10/2020 13:55:10 [name] ([id number]) -------------------------------------------------------------------------------- SuperBill Details Patient Name: [name] Date of Service: 04/10/2020 Medical Record Number: [id number] Patient Account Number: [id number] Date of Birth/Sex: 06/26/1946 (74 y.o. F) Treating RN: Cornell Barman Primary Care Provider: [name] Other Clinician: Referring Provider: [name] Treating Provider/Extender: [name], Damiah Mcdonald [name] in Treatment: 5 Diagnosis Coding ICD-10 Codes Code Description I87.2 Venous insufficiency (chronic) (peripheral) L97.822 Non-pressure chronic ulcer of other part of left lower leg with fat layer exposed I10 Essential (primary) hypertension I50.42 Chronic combined systolic (congestive) and diastolic (congestive) heart failure C50.919 Malignant neoplasm of unspecified  site of unspecified female breast Facility Procedures CPT4 Code: [id number] Description: (Facility Use Only) [phone] - [place] [facility] LT LEG Modifier: Quantity: 1 Physician Procedures CPT4 Code: [id number] Description: 01749 - WC PHYS LEVEL 3 - EST PT Modifier: Quantity: 1 CPT4 Code: Description: ICD-10 Diagnosis Description I87.2 Venous insufficiency (chronic) (peripheral) L97.822 Non-pressure chronic ulcer of other part of left lower leg with fat lay I10 Essential (primary) hypertension I50.42 Chronic combined systolic (congestive)  and diastolic (congestive) heart Modifier: er exposed failure Quantity: Electronic Signature(s) Signed: 04/10/2020 7:39:38 PM By: Gretta Cool, BSN, RN, CWS, Kim RN, BSN Previous Signature: 04/10/2020 1:57:09 PM Version By: Worthy Keeler PA-C Entered By: Gretta Cool, BSN, RN, CWS, Kim on 04/10/2020 19:39:38

## 2020-04-10 NOTE — Progress Notes (Signed)
LAAIBAH, WARTMAN ([id number]) Visit Report for 04/10/2020 Arrival Information Details Patient Name: [name], [name] Date of Service: 04/10/2020 1:00 PM Medical Record Number: [id number] Patient Account Number: [id number] Date of Birth/Sex: Dec 19, 1945 (74 y.o. F) Treating RN: Grover Canavan Primary Care Provider: [name] Other Clinician: Referring Provider: [name] Treating Provider/Extender: Melburn Hake, HOYT Weeks in Treatment: 5 Visit Information History Since Last Visit Added or deleted any medications: No Patient Arrived: [name] Had a fall or experienced change in Yes Arrival Time: 13:10 activities of daily living that may affect Accompanied By: self risk of falls: Transfer Assistance: Manual Hospitalized since last visit: No Patient Identification Verified: Yes Has Dressing in Place as Prescribed: Yes Secondary Verification Process Completed: Yes Pain Present Now: Yes Patient Requires Transmission-Based Precautions: No Patient Has Alerts: No Electronic Signature(s) Signed: 04/10/2020 5:29:55 PM By: Grover Canavan Entered By: Grover Canavan on 04/10/2020 13:29:16 [name] ([id number]) -------------------------------------------------------------------------------- Compression Therapy Details Patient Name: [name] Date of Service: 04/10/2020 1:00 PM Medical Record Number: [id number] Patient Account Number: [id number] Date of Birth/Sex: July 29, 1946 (74 y.o. F) Treating RN: Grover Canavan Primary Care Provider: [name] Other Clinician: Referring Provider: [name] Treating Provider/Extender: Melburn Hake, HOYT Weeks in Treatment: 5 Compression Therapy Performed for Wound Assessment: Wound #1 Left,Posterior Lower Leg Performed By: Clinician Grover Canavan, RN Compression Type: Three Layer Pre Treatment ABI: 1.2 Post Procedure Diagnosis Same as Pre-procedure Electronic Signature(s) Signed: 04/10/2020 5:29:55 PM By: Grover Canavan Entered By: Grover Canavan on 04/10/2020 13:50:24 [name] ([id number]) -------------------------------------------------------------------------------- Encounter Discharge Information Details Patient Name: [name] Date of Service: 04/10/2020 1:00 PM Medical Record Number: [id number] Patient Account Number: [id number] Date of Birth/Sex: 09-10-1945 (74 y.o. F) Treating RN: Grover Canavan Primary Care Provider: [name] Other Clinician: Referring Provider: [name] Treating Provider/Extender: Melburn Hake, HOYT Weeks in Treatment: 5 Encounter Discharge Information Items Discharge Condition: Stable Ambulatory Status: Ambulatory Discharge Destination: Home Transportation: Private Auto Accompanied By: self Schedule Follow-up Appointment: Yes Clinical Summary of Care: Electronic Signature(s) Signed: 04/10/2020 5:29:55 PM By: Grover Canavan Entered By: Grover Canavan on 04/10/2020 13:52:20 [name] ([id number]) -------------------------------------------------------------------------------- Lower Extremity Assessment Details Patient Name: [name] Date of Service: 04/10/2020 1:00 PM Medical Record Number: [id number] Patient Account Number: [id number] Date of Birth/Sex: 10-24-1945 (74 y.o. F) Treating RN: Grover Canavan Primary Care Provider: [name] Other Clinician: Referring Provider: [name] Treating Provider/Extender: STONE III, HOYT Weeks in Treatment: 5 Edema Assessment Assessed: [Left: Yes] [Right: No] Edema: [Left: Ye] [Right: s] Calf Left: Right: Point of Measurement: cm From Medial Instep 49 cm cm Ankle Left: Right: Point of Measurement: cm From Medial Instep 26 cm cm Vascular Assessment Pulses: Dorsalis Pedis Palpable: [Left:Yes] Posterior Tibial Palpable: [Left:Yes] Electronic Signature(s) Signed: 04/10/2020 5:29:55 PM By: Grover Canavan Entered By: Grover Canavan on 04/10/2020 13:35:57 [name] ([id number]) -------------------------------------------------------------------------------- Multi Wound Chart Details Patient Name: [name] Date of Service: 04/10/2020 1:00 PM Medical Record Number: [id number] Patient Account Number: [id number] Date of Birth/Sex: 10-Nov-1945 (74 y.o. F) Treating RN: Grover Canavan Primary Care Provider: [name] Other Clinician: Referring Provider: [name] Treating Provider/Extender: STONE III, HOYT Weeks in Treatment: 5 Vital Signs Height(in): 35 Pulse(bpm): 63 Weight(lbs): 585 Blood Pressure(mmHg): 119/89 Body Mass Index(BMI): 39 Temperature(F): 98.3 Respiratory Rate(breaths/min): 18 Photos: [N/A:N/A] Wound Location: Left, Posterior Lower Leg N/A N/A Wounding Event: Gradually Appeared N/A N/A Primary Etiology: Venous Leg Ulcer N/A N/A Comorbid History: Cataracts, Congestive Heart Failure, N/A N/A Hypertension, Type II Diabetes, Osteoarthritis, Neuropathy, Received Chemotherapy, Received Radiation Date Acquired: 03/06/2020 N/A  N/A Weeks of Treatment: 5 N/A N/A Wound Status: Open N/A N/A Measurements L x W x D (cm) 1.2x1.2x0.1 N/A N/A Area (cm) : 1.131 N/A N/A Volume (cm) : 0.113 N/A N/A % Reduction in Area: 7.70% N/A N/A % Reduction in Volume: 8.10% N/A N/A Classification: Full Thickness Without Exposed N/A N/A Support Structures Exudate Amount: Medium N/A N/A Exudate Type: Serous N/A N/A Exudate Color: amber N/A N/A Wound Margin: Flat and Intact N/A N/A Granulation Amount: Medium (34-66%) N/A N/A Granulation Quality: Red N/A N/A Necrotic Amount: Small (1-33%) N/A N/A Exposed Structures: Fat Layer (Subcutaneous Tissue) N/A N/A Exposed: Yes Fascia: No Tendon: No Muscle: No Joint: No Bone: No Epithelialization: Small (1-33%) N/A N/A Treatment Notes Electronic Signature(s) Signed: 04/10/2020 5:29:55 PM By: Grover Canavan Entered By: Grover Canavan on 04/10/2020 13:50:09 [name]  ([id number]) [name], [name] ([id number]) -------------------------------------------------------------------------------- Multi-Disciplinary Care Plan Details Patient Name: [name] Date of Service: 04/10/2020 1:00 PM Medical Record Number: [id number] Patient Account Number: [id number] Date of Birth/Sex: Aug 19, 1946 (74 y.o. F) Treating RN: Grover Canavan Primary Care Provider: [name] Other Clinician: Referring Provider: [name] Treating Provider/Extender: Melburn Hake, HOYT Weeks in Treatment: 5 Active Inactive Necrotic Tissue Nursing Diagnoses: Impaired tissue integrity related to necrotic/devitalized tissue Knowledge deficit related to management of necrotic/devitalized tissue Goals: Necrotic/devitalized tissue will be minimized in the wound bed Date Initiated: 03/06/2020 Target Resolution Date: 05/11/2020 Goal Status: Active Patient/caregiver will verbalize understanding of reason and process for debridement of necrotic tissue Date Initiated: 03/06/2020 Date Inactivated: 04/10/2020 Target Resolution Date: 03/06/2020 Goal Status: Met Interventions: Assess patient pain level pre-, during and post procedure and prior to discharge Provide education on necrotic tissue and debridement process Treatment Activities: Apply topical anesthetic as ordered : 03/06/2020 Notes: Pain, Acute or Chronic Nursing Diagnoses: Pain Management - Non-cyclic Acute (Procedural) Pain, acute or chronic: actual or potential Goals: Patient will verbalize adequate pain control and receive pain control interventions during procedures as needed Date Initiated: 03/06/2020 Target Resolution Date: 05/11/2020 Goal Status: Active Interventions: Reposition patient for comfort Notes: Venous Leg Ulcer Nursing Diagnoses: Potential for venous Insuffiency (use before diagnosis confirmed) Goals: Patient will maintain optimal edema control Date Initiated: 03/06/2020 Target Resolution Date: 05/11/2020 Goal  Status: Active Interventions: Assess peripheral edema status every visit. Compression as ordered Treatment Activities: [name], [name] ([id number]) Therapeutic compression applied : 03/06/2020 Notes: Electronic Signature(s) Signed: 04/10/2020 5:29:55 PM By: Grover Canavan Entered By: Grover Canavan on 04/10/2020 13:50:01 [name] ([id number]) -------------------------------------------------------------------------------- Pain Assessment Details Patient Name: [name] Date of Service: 04/10/2020 1:00 PM Medical Record Number: [id number] Patient Account Number: [id number] Date of Birth/Sex: August 24, 1946 (74 y.o. F) Treating RN: Grover Canavan Primary Care Provider: [name] Other Clinician: Referring Provider: [name] Treating Provider/Extender: Melburn Hake, HOYT Weeks in Treatment: 5 Active Problems Location of Pain Severity and Description of Pain Patient Has Paino Yes Site Locations Pain Location: Pain in Ulcers Duration of the Pain. Constant / Intermittento Constant Rate the pain. Current Pain Level: 8 Pain Management and Medication Current Pain Management: Electronic Signature(s) Signed: 04/10/2020 5:29:55 PM By: Grover Canavan Entered By: Grover Canavan on 04/10/2020 13:29:26 [name] ([id number]) -------------------------------------------------------------------------------- Patient/Caregiver Education Details Patient Name: [name] Date of Service: 04/10/2020 1:00 PM Medical Record Number: [id number] Patient Account Number: [id number] Date of Birth/Gender: 10/18/1945 (74 y.o. F) Treating RN: Grover Canavan Primary Care Physician: [name] Other Clinician: Referring Physician: [name] Treating Physician/Extender: Sharalyn Ink in Treatment: 5 Education Assessment Education Provided To: Patient Education Topics Provided Wound/Skin Impairment:  Handouts: Caring for Your Ulcer Methods: Demonstration,  Explain/Verbal Responses: State content correctly Electronic Signature(s) Signed: 04/10/2020 5:29:55 PM By: Grover Canavan Entered By: Grover Canavan on 04/10/2020 13:51:46 [name] ([id number]) -------------------------------------------------------------------------------- Wound Assessment Details Patient Name: [name] Date of Service: 04/10/2020 1:00 PM Medical Record Number: [id number] Patient Account Number: [id number] Date of Birth/Sex: Mar 07, 1946 (74 y.o. F) Treating RN: Grover Canavan Primary Care Provider: [name] Other Clinician: Referring Provider: [name] Treating Provider/Extender: Melburn Hake, HOYT Weeks in Treatment: 5 Wound Status Wound Number: 1 Primary Venous Leg Ulcer Etiology: Wound Location: Left, Posterior Lower Leg Wound Open Wounding Event: Gradually Appeared Status: Date Acquired: 03/06/2020 Comorbid Cataracts, Congestive Heart Failure, Hypertension, Type II Weeks Of Treatment: 5 History: Diabetes, Osteoarthritis, Neuropathy, Received Clustered Wound: No Chemotherapy, Received Radiation Photos Wound Measurements Length: (cm) 1.2 Width: (cm) 1.2 Depth: (cm) 0.1 Area: (cm) 1.131 Volume: (cm) 0.113 % Reduction in Area: 7.7% % Reduction in Volume: 8.1% Epithelialization: Small (1-33%) Tunneling: No Undermining: No Wound Description Classification: Full Thickness Without Exposed Support Structu Wound Margin: Flat and Intact Exudate Amount: Medium Exudate Type: Serous Exudate Color: amber res Foul Odor After Cleansing: No Slough/Fibrino Yes Wound Bed Granulation Amount: Medium (34-66%) Exposed Structure Granulation Quality: Red Fascia Exposed: No Necrotic Amount: Small (1-33%) Fat Layer (Subcutaneous Tissue) Exposed: Yes Necrotic Quality: Adherent Slough Tendon Exposed: No Muscle Exposed: No Joint Exposed: No Bone Exposed: No Treatment Notes Wound #1 (Left, Posterior Lower Leg) Notes Prisma, abd, 3 layer  left with unna to anchor Electronic Signature(s) [name], [name] ([id number]) Signed: 04/10/2020 5:29:55 PM By: Grover Canavan Entered By: Grover Canavan on 04/10/2020 13:33:07 [name] ([id number]) -------------------------------------------------------------------------------- [place] Details Patient Name: [name] Date of Service: 04/10/2020 1:00 PM Medical Record Number: [id number] Patient Account Number: [id number] Date of Birth/Sex: 24-Mar-1946 (74 y.o. F) Treating RN: Cornell Barman Primary Care Provider: [name] Other Clinician: Referring Provider: [name] Treating Provider/Extender: STONE III, HOYT Weeks in Treatment: 5 Vital Signs Time Taken: 13:12 Temperature (F): 98.3 Height (in): 69 Pulse (bpm): 58 Weight (lbs): 263 Respiratory Rate (breaths/min): 18 Body Mass Index (BMI): 38.8 Blood Pressure (mmHg): 119/89 Reference Range: 80 - 120 mg / dl Electronic Signature(s) Signed: 04/10/2020 5:29:55 PM By: Grover Canavan Entered By: Grover Canavan on 04/10/2020 13:29:20

## 2020-04-17 ENCOUNTER — Ambulatory Visit: Payer: Medicare HMO | Admitting: Physician Assistant

## 2020-04-23 ENCOUNTER — Ambulatory Visit: Payer: Medicare HMO | Admitting: Physician Assistant

## 2020-04-26 ENCOUNTER — Other Ambulatory Visit: Payer: Self-pay

## 2020-04-26 ENCOUNTER — Encounter: Payer: Medicare HMO | Admitting: Physician Assistant

## 2020-04-26 DIAGNOSIS — I872 Venous insufficiency (chronic) (peripheral): Secondary | ICD-10-CM | POA: Diagnosis not present

## 2020-04-26 NOTE — Progress Notes (Addendum)
[name], [name] ([id number]) Visit Report for 04/26/2020 Chief Complaint Document Details Patient Name: [name], [name] Date of Service: 04/26/2020 2:15 PM Medical Record Number: [id number] Patient Account Number: [id number] Date of Birth/Sex: 1945-10-22 (74 y.o. F) Treating RN: Army Melia Primary Care Provider: [name] Other Clinician: Referring Provider: [name] Treating Provider/Extender: Melburn Hake, Anayiah Howden Weeks in Treatment: 7 Information Obtained from: Patient Chief Complaint Left LE Ulcer Electronic Signature(s) Signed: 04/26/2020 2:50:24 PM By: Worthy Keeler PA-C Entered By: Worthy Keeler on 04/26/2020 14:50:23 [name] ([id number]) -------------------------------------------------------------------------------- Debridement Details Patient Name: [name] Date of Service: 04/26/2020 2:15 PM Medical Record Number: [id number] Patient Account Number: [id number] Date of Birth/Sex: [birth year] (74 y.o. F) Treating RN: Cornell Barman Primary Care Provider: [name] Other Clinician: Referring Provider: [name] Treating Provider/Extender: Melburn Hake, Cedrik Heindl Weeks in Treatment: 7 Debridement Performed for Wound #1 Left,Posterior Lower Leg Assessment: Performed By: Physician STONE III, Fredia Chittenden E., PA-C Debridement Type: Debridement Severity of Tissue Pre Debridement: Fat layer exposed Level of Consciousness (Pre- Awake and Alert procedure): Pre-procedure Verification/Time Out Yes - 14:59 Taken: Total Area Debrided (L x W): 1.1 (cm) x 0.9 (cm) = 0.99 (cm) Tissue and other material Viable, Non-Viable, Slough, Subcutaneous, Skin: Epidermis, Slough debrided: Level: Skin/Subcutaneous Tissue Debridement Description: Excisional Instrument: Curette Bleeding: Minimum Hemostasis Achieved: Pressure Response to Treatment: Procedure was tolerated well Level of Consciousness (Post- Awake and Alert procedure): Post Debridement Measurements of Total  Wound Length: (cm) 1.1 Width: (cm) 0.9 Depth: (cm) 0.2 Volume: (cm) 0.156 Character of Wound/Ulcer Post Debridement: Requires Further Debridement Severity of Tissue Post Debridement: Fat layer exposed Post Procedure Diagnosis Same as Pre-procedure Electronic Signature(s) Signed: 04/26/2020 5:00:57 PM By: Worthy Keeler PA-C Signed: 04/26/2020 6:39:29 PM By: Gretta Cool, BSN, RN, CWS, Kim RN, BSN Entered By: Gretta Cool, BSN, RN, CWS, Kim on 04/26/2020 15:00:20 [name] ([id number]) -------------------------------------------------------------------------------- HPI Details Patient Name: [name] Date of Service: 04/26/2020 2:15 PM Medical Record Number: [id number] Patient Account Number: [id number] Date of Birth/Sex: [birth year] (74 y.o. F) Treating RN: Army Melia Primary Care Provider: [name] Other Clinician: Referring Provider: [name] Treating Provider/Extender: Melburn Hake, Mckennon Zwart Weeks in Treatment: 7 History of Present Illness HPI Description: 03/15/2020 upon evaluation today patient presents today for initial evaluation here in our clinic concerning a wound that is on the left posterior lower extremity. Unfortunately this has been giving the patient some discomfort at this point which she notes has been affected her pretty much daily. The patient does have a history of venous insufficiency, hypertension, congestive heart failure, and a history of breast cancer. Fortunately there does not appear to be evidence of active infection at this time which is great news. No fevers, chills, nausea, vomiting, or diarrhea. Wound is not extremely large but does have some slough covering the surface of the wound. This is going require sharp debridement today. 03/15/2020 upon evaluation today patient appears to be doing fairly well in regard to her wound. She does have some slough noted on the surface of the wound currently but I do believe the Iodoflex has been beneficial over the  past week. There is no signs of active infection at this time which is great news and overall very pleased with the progress. No fevers, chills, nausea, vomiting, or diarrhea. 04/02/2020 upon evaluation today patient appears to be doing a little better in regard to her wound size wise this is not tremendously smaller she does have some slough buildup on the surface of the wound. With that being said  this is going require some sharp debridement today to clear away some of the necrotic debris. Fortunately there is no evidence of active infection at this time. No fevers, chills, nausea, vomiting, or diarrhea. 04/10/2020 on evaluation today patient appears to be doing well with regard to her ulcer which is measuring somewhat smaller today. She actually has more granulation tissue noted which is also good news is no need for sharp debridement today. Fortunately there is no evidence of infection either which is also excellent. No fevers, chills, nausea, vomiting, or diarrhea. 04/26/2020 on evaluation today patient's wound actually is showing signs of good improvement which is great news. There does not appear to be any evidence of active infection. I do believe she is tolerating the collagen at this point. Electronic Signature(s) Signed: 04/26/2020 3:05:43 PM By: Worthy Keeler PA-C Entered By: Worthy Keeler on 04/26/2020 15:05:43 [name] ([id number]) -------------------------------------------------------------------------------- Physical Exam Details Patient Name: [name] Date of Service: 04/26/2020 2:15 PM Medical Record Number: [id number] Patient Account Number: [id number] Date of Birth/Sex: 02-Sep-1946 (74 y.o. F) Treating RN: Army Melia Primary Care Provider: [name] Other Clinician: Referring Provider: [name] Treating Provider/Extender: STONE III, Bertine Schlottman Weeks in Treatment: 7 Constitutional Obese and well-hydrated in no acute distress. Respiratory normal breathing  without difficulty. Psychiatric this patient is able to make decisions and demonstrates good insight into disease process. Alert and Oriented x 3. pleasant and cooperative. Notes Patient's wound bed actually showed signs of again good granulation there was some slough noted that is can require some sharp debridement but in general she seems to be managing quite nicely. I am very pleased with the overall appearance of the wound bed otherwise. Post debridement this appears to be doing very well. Electronic Signature(s) Signed: 04/26/2020 3:06:01 PM By: Worthy Keeler PA-C Entered By: Worthy Keeler on 04/26/2020 15:06:00 [name] ([id number]) -------------------------------------------------------------------------------- Physician Orders Details Patient Name: [name] Date of Service: 04/26/2020 2:15 PM Medical Record Number: [id number] Patient Account Number: [id number] Date of Birth/Sex: [birth year] (74 y.o. F) Treating RN: Cornell Barman Primary Care Provider: [name] Other Clinician: Referring Provider: [name] Treating Provider/Extender: Melburn Hake, Alisen Marsiglia Weeks in Treatment: 7 Verbal / Phone Orders: No Diagnosis Coding ICD-10 Coding Code Description I87.2 Venous insufficiency (chronic) (peripheral) L97.822 Non-pressure chronic ulcer of other part of left lower leg with fat layer exposed I10 Essential (primary) hypertension I50.42 Chronic combined systolic (congestive) and diastolic (congestive) heart failure C50.919 Malignant neoplasm of unspecified site of unspecified female breast Wound Cleansing o Cleanse wound with mild soap and water Anesthetic (add to Medication List) Wound #1 Left,Posterior Lower Leg o Topical Lidocaine 4% cream applied to wound bed prior to debridement (In Clinic Only). Primary Wound Dressing Wound #1 Left,Posterior Lower Leg o Silver Collagen Secondary Dressing Wound #1 Left,Posterior Lower Leg o ABD pad Dressing Change  Frequency Wound #1 Left,Posterior Lower Leg o Change dressing every week - in clinic and nurse visit as needed Follow-up Appointments Wound #1 Left,Posterior Lower Leg o Return Appointment in 1 week. Edema Control Wound #1 Left,Posterior Lower Leg o 3 Layer Compression System - Left Lower Extremity - TCA to leg to help with itching o Elevate legs to the level of the heart and pump ankles as often as possible Additional Orders / Instructions Wound #1 Left,Posterior Lower Leg o Vitamin A; Vitamin C, Zinc o Increase protein intake. Electronic Signature(s) Signed: 04/26/2020 5:00:57 PM By: Worthy Keeler PA-C Signed: 04/26/2020 6:39:29 PM By: Gretta Cool, BSN, RN, CWS, Kim  RN, BSN Entered By: Gretta Cool, BSN, RN, CWS, Kim on 04/26/2020 15:01:07 [name] ([id number]) -------------------------------------------------------------------------------- Problem List Details Patient Name: [name] Date of Service: 04/26/2020 2:15 PM Medical Record Number: [id number] Patient Account Number: [id number] Date of Birth/Sex: April 03, 1946 (74 y.o. F) Treating RN: Army Melia Primary Care Provider: [name] Other Clinician: Referring Provider: [name] Treating Provider/Extender: Melburn Hake, Karl Knarr Weeks in Treatment: 7 Active Problems ICD-10 Encounter Code Description Active Date MDM Diagnosis I87.2 Venous insufficiency (chronic) (peripheral) 03/06/2020 No Yes L97.822 Non-pressure chronic ulcer of other part of left lower leg with fat layer 03/06/2020 No Yes exposed I10 Essential (primary) hypertension 03/06/2020 No Yes I50.42 Chronic combined systolic (congestive) and diastolic (congestive) heart 03/06/2020 No Yes failure C50.919 Malignant neoplasm of unspecified site of unspecified female breast 03/06/2020 No Yes Inactive Problems Resolved Problems Electronic Signature(s) Signed: 04/26/2020 2:50:17 PM By: Worthy Keeler PA-C Entered By: Worthy Keeler on 04/26/2020  14:50:17 [name] ([id number]) -------------------------------------------------------------------------------- Progress Note Details Patient Name: [name] Date of Service: 04/26/2020 2:15 PM Medical Record Number: [id number] Patient Account Number: [id number] Date of Birth/Sex: 01-Mar-1946 (74 y.o. F) Treating RN: Army Melia Primary Care Provider: [name] Other Clinician: Referring Provider: [name] Treating Provider/Extender: Melburn Hake, Adalyn Pennock Weeks in Treatment: 7 Subjective Chief Complaint Information obtained from Patient Left LE Ulcer History of Present Illness (HPI) 03/15/2020 upon evaluation today patient presents today for initial evaluation here in our clinic concerning a wound that is on the left posterior lower extremity. Unfortunately this has been giving the patient some discomfort at this point which she notes has been affected her pretty much daily. The patient does have a history of venous insufficiency, hypertension, congestive heart failure, and a history of breast cancer. Fortunately there does not appear to be evidence of active infection at this time which is great news. No fevers, chills, nausea, vomiting, or diarrhea. Wound is not extremely large but does have some slough covering the surface of the wound. This is going require sharp debridement today. 03/15/2020 upon evaluation today patient appears to be doing fairly well in regard to her wound. She does have some slough noted on the surface of the wound currently but I do believe the Iodoflex has been beneficial over the past week. There is no signs of active infection at this time which is great news and overall very pleased with the progress. No fevers, chills, nausea, vomiting, or diarrhea. 04/02/2020 upon evaluation today patient appears to be doing a little better in regard to her wound size wise this is not tremendously smaller she does have some slough buildup on the surface of the  wound. With that being said this is going require some sharp debridement today to clear away some of the necrotic debris. Fortunately there is no evidence of active infection at this time. No fevers, chills, nausea, vomiting, or diarrhea. 04/10/2020 on evaluation today patient appears to be doing well with regard to her ulcer which is measuring somewhat smaller today. She actually has more granulation tissue noted which is also good news is no need for sharp debridement today. Fortunately there is no evidence of infection either which is also excellent. No fevers, chills, nausea, vomiting, or diarrhea. 04/26/2020 on evaluation today patient's wound actually is showing signs of good improvement which is great news. There does not appear to be any evidence of active infection. I do believe she is tolerating the collagen at this point. Objective Constitutional Obese and well-hydrated in no acute distress. Vitals Time  Taken: 2:00 AM, Height: 69 in, Weight: 263 lbs, BMI: 38.8, Temperature: 98.8 F, Pulse: 64 bpm, Respiratory Rate: 20 breaths/min, Blood Pressure: 144/71 mmHg. Respiratory normal breathing without difficulty. Psychiatric this patient is able to make decisions and demonstrates good insight into disease process. Alert and Oriented x 3. pleasant and cooperative. General Notes: Patient's wound bed actually showed signs of again good granulation there was some slough noted that is can require some sharp debridement but in general she seems to be managing quite nicely. I am very pleased with the overall appearance of the wound bed otherwise. Post debridement this appears to be doing very well. Integumentary (Hair, Skin) Wound #1 status is Open. Original cause of wound was Gradually Appeared. The wound is located on the Left,Posterior Lower Leg. The wound measures 1.1cm length x 0.9cm width x 0.1cm depth; 0.778cm^2 area and 0.078cm^3 volume. There is Fat Layer (Subcutaneous Tissue) Exposed  exposed. There is a medium amount of serous drainage noted. The wound margin is flat and intact. There is medium (34-66%) red granulation within the wound bed. There is a small (1-33%) amount of necrotic tissue within the wound bed including Adherent Slough. [name], [name] ([id number]) Assessment Active Problems ICD-10 Venous insufficiency (chronic) (peripheral) Non-pressure chronic ulcer of other part of left lower leg with fat layer exposed Essential (primary) hypertension Chronic combined systolic (congestive) and diastolic (congestive) heart failure Malignant neoplasm of unspecified site of unspecified female breast Procedures Wound #1 Pre-procedure diagnosis of Wound #1 is a Venous Leg Ulcer located on the Left,Posterior Lower Leg .Severity of Tissue Pre Debridement is: Fat layer exposed. There was a Excisional Skin/Subcutaneous Tissue Debridement with a total area of 0.99 sq cm performed by STONE III, Jodye Scali E., PA-C. With the following instrument(s): Curette to remove Viable and Non-Viable tissue/material. Material removed includes Subcutaneous Tissue, Slough, and Skin: Epidermis. No specimens were taken. A time out was conducted at 14:59, prior to the start of the procedure. A Minimum amount of bleeding was controlled with Pressure. The procedure was tolerated well. Post Debridement Measurements: 1.1cm length x 0.9cm width x 0.2cm depth; 0.156cm^3 volume. Character of Wound/Ulcer Post Debridement requires further debridement. Severity of Tissue Post Debridement is: Fat layer exposed. Post procedure Diagnosis Wound #1: Same as Pre-Procedure Plan Wound Cleansing: Cleanse wound with mild soap and water Anesthetic (add to Medication List): Wound #1 Left,Posterior Lower Leg: Topical Lidocaine 4% cream applied to wound bed prior to debridement (In Clinic Only). Primary Wound Dressing: Wound #1 Left,Posterior Lower Leg: Silver Collagen Secondary Dressing: Wound #1 Left,Posterior Lower  Leg: ABD pad Dressing Change Frequency: Wound #1 Left,Posterior Lower Leg: Change dressing every week - in clinic and nurse visit as needed Follow-up Appointments: Wound #1 Left,Posterior Lower Leg: Return Appointment in 1 week. Edema Control: Wound #1 Left,Posterior Lower Leg: 3 Layer Compression System - Left Lower Extremity - TCA to leg to help with itching Elevate legs to the level of the heart and pump ankles as often as possible Additional Orders / Instructions: Wound #1 Left,Posterior Lower Leg: Vitamin A; Vitamin C, Zinc Increase protein intake. 1. I would recommend since she is continuing to show signs of improvement that we continue with the collagen dressing at this time I think that still an optimal way to go. Each time I see her she is measuring smaller over the past 4-5 visits. 2. I am also can recommend at this time that we will continue with a 3 layer compression wrap that seems to be keeping the edema under  good control. We will see patient back for reevaluation in 1 week here in the clinic. If anything worsens or changes patient will contact our office for additional recommendations. Electronic Signature(s) [name], [name] ([id number]) Signed: 04/26/2020 3:06:35 PM By: Worthy Keeler PA-C Entered By: Worthy Keeler on 04/26/2020 15:06:34 [name] ([id number]) -------------------------------------------------------------------------------- SuperBill Details Patient Name: [name] Date of Service: 04/26/2020 Medical Record Number: [id number] Patient Account Number: [id number] Date of Birth/Sex: [birth year] (74 y.o. F) Treating RN: Army Melia Primary Care Provider: [name] Other Clinician: Referring Provider: [name] Treating Provider/Extender: Melburn Hake, Jahara Dail Weeks in Treatment: 7 Diagnosis Coding ICD-10 Codes Code Description I87.2 Venous insufficiency (chronic) (peripheral) L97.822 Non-pressure chronic ulcer of other part of left  lower leg with fat layer exposed I10 Essential (primary) hypertension I50.42 Chronic combined systolic (congestive) and diastolic (congestive) heart failure C50.919 Malignant neoplasm of unspecified site of unspecified female breast Facility Procedures CPT4 Code: [id number] Description: 59292 - DEB SUBQ TISSUE 20 SQ CM/< Modifier: Quantity: 1 CPT4 Code: Description: ICD-10 Diagnosis Description L97.822 Non-pressure chronic ulcer of other part of left lower leg with fat layer Modifier: exposed Quantity: Physician Procedures CPT4 Code: [id number] Description: 81771 - WC PHYS SUBQ TISS 20 SQ CM Modifier: Quantity: 1 CPT4 Code: Description: ICD-10 Diagnosis Description L97.822 Non-pressure chronic ulcer of other part of left lower leg with fat layer Modifier: exposed Quantity: Electronic Signature(s) Signed: 04/26/2020 3:06:51 PM By: Worthy Keeler PA-C Entered By: Worthy Keeler on 04/26/2020 15:06:51

## 2020-04-27 NOTE — Progress Notes (Signed)
YVONNA, BRUN ([id number]) Visit Report for 04/26/2020 Arrival Information Details Patient Name: [name], [name] Date of Service: 04/26/2020 2:15 PM Medical Record Number: [id number] Patient Account Number: [id number] Date of Birth/Sex: [birth year] (74 y.o. F) Treating RN: Cornell Barman Primary Care Clayson Riling: [name] Other Clinician: Referring Armya Westerhoff: [name] Treating Amanpreet Delmont/Extender: Melburn Hake, HOYT Weeks in Treatment: 7 Visit Information History Since Last Visit All ordered tests and consults were completed: No Patient Arrived: Gilford Rile Added or deleted any medications: No Arrival Time: 14:33 Any new allergies or adverse reactions: No Accompanied By: self Had a fall or experienced change in No Transfer Assistance: None activities of daily living that may affect Patient Identification Verified: Yes risk of falls: Secondary Verification Process Completed: Yes Signs or symptoms of abuse/neglect since last visito No Patient Requires Transmission-Based Precautions: No Hospitalized since last visit: No Patient Has Alerts: No Implantable device outside of the clinic excluding No cellular tissue based products placed in the center since last visit: Has Dressing in Place as Prescribed: Yes Has Compression in Place as Prescribed: Yes Pain Present Now: Yes Notes staes leg hurts all the time. Minimal relief with OTCs. Uses "Tiger balm" for short term pain relief. Electronic Signature(s) Signed: 04/27/2020 8:16:00 AM By: Gretta Cool, BSN, RN, CWS, Kim RN, BSN Previous Signature: 04/27/2020 8:00:18 AM Version By: Darci Needle Entered By: Gretta Cool BSN, RN, CWS, Kim on 04/27/2020 08:16:00 [name] ([id number]) -------------------------------------------------------------------------------- Encounter Discharge Information Details Patient Name: [name] Date of Service: 04/26/2020 2:15 PM Medical Record Number: [id number] Patient Account Number: [id number] Date of  Birth/Sex: 1946-05-22 (74 y.o. F) Treating RN: Cornell Barman Primary Care Marily Konczal: [name] Other Clinician: Referring Mckay Brandt: [name] Treating Vaishnav Demartin/Extender: Melburn Hake, HOYT Weeks in Treatment: 7 Encounter Discharge Information Items Post Procedure Vitals Discharge Condition: Stable Temperature (F): 98.8 Ambulatory Status: Wheelchair Pulse (bpm): 64 Discharge Destination: Home Respiratory Rate (breaths/min): 20 Transportation: Private Auto Blood Pressure (mmHg): 144/71 Accompanied By: self Schedule Follow-up Appointment: No Clinical Summary of Care: Electronic Signature(s) Signed: 04/26/2020 6:39:29 PM By: Gretta Cool, BSN, RN, CWS, Kim RN, BSN Entered By: Gretta Cool, BSN, RN, CWS, Kim on 04/26/2020 15:03:12 [name] ([id number]) -------------------------------------------------------------------------------- Lower Extremity Assessment Details Patient Name: [name] Date of Service: 04/26/2020 2:15 PM Medical Record Number: [id number] Patient Account Number: [id number] Date of Birth/Sex: [birth year] (74 y.o. F) Treating RN: Cornell Barman Primary Care Dreshawn Hendershott: [name] Other Clinician: Referring Verdia Bolt: [name] Treating Kaedon Fanelli/Extender: Melburn Hake, HOYT Weeks in Treatment: 7 Edema Assessment Assessed: [Left: Yes] [Right: No] Edema: [Left: Ye] [Right: s] Calf Left: Right: Point of Measurement: 32 cm From Medial Instep 47 cm cm Ankle Left: Right: Point of Measurement: 12 cm From Medial Instep 26.5 cm cm Vascular Assessment Pulses: Dorsalis Pedis Palpable: [Left:Yes] Posterior Tibial Palpable: [Left:Yes] Electronic Signature(s) Signed: 04/27/2020 8:16:44 AM By: Gretta Cool, BSN, RN, CWS, Kim RN, BSN Previous Signature: 04/27/2020 8:00:18 AM Version By: Darci Needle Entered By: Gretta Cool BSN, RN, CWS, Kim on 04/27/2020 08:16:44 [name] ([id number]) -------------------------------------------------------------------------------- Multi Wound  Chart Details Patient Name: [name] Date of Service: 04/26/2020 2:15 PM Medical Record Number: [id number] Patient Account Number: [id number] Date of Birth/Sex: October 16, 1945 (74 y.o. F) Treating RN: Cornell Barman Primary Care Kinzleigh Kandler: [name] Other Clinician: Referring Flora Parks: [name] Treating Aniya Jolicoeur/Extender: STONE III, HOYT Weeks in Treatment: 7 Vital Signs Height(in): 69 Pulse(bpm): 64 Weight(lbs): 263 Blood Pressure(mmHg): 144/71 Body Mass Index(BMI): 39 Temperature(F): 98.8 Respiratory Rate(breaths/min): 20 Photos: [N/A:N/A] Wound Location: Left, Posterior Lower Leg N/A N/A Wounding Event: Gradually Appeared N/A N/A Primary Etiology:  Venous Leg Ulcer N/A N/A Comorbid History: Cataracts, Congestive Heart Failure, N/A N/A Hypertension, Type II Diabetes, Osteoarthritis, Neuropathy, Received Chemotherapy, Received Radiation Date Acquired: 03/06/2020 N/A N/A Weeks of Treatment: 7 N/A N/A Wound Status: Open N/A N/A Measurements L x W x D (cm) 1.1x0.9x0.1 N/A N/A Area (cm) : 0.778 N/A N/A Volume (cm) : 0.078 N/A N/A % Reduction in Area: 36.50% N/A N/A % Reduction in Volume: 36.60% N/A N/A Classification: Full Thickness Without Exposed N/A N/A Support Structures Exudate Amount: Medium N/A N/A Exudate Type: Serous N/A N/A Exudate Color: amber N/A N/A Wound Margin: Flat and Intact N/A N/A Granulation Amount: Medium (34-66%) N/A N/A Granulation Quality: Red N/A N/A Necrotic Amount: Small (1-33%) N/A N/A Exposed Structures: Fat Layer (Subcutaneous Tissue) N/A N/A Exposed: Yes Fascia: No Tendon: No Muscle: No Joint: No Bone: No Epithelialization: Small (1-33%) N/A N/A Treatment Notes Electronic Signature(s) Signed: 04/26/2020 6:39:29 PM By: Gretta Cool, BSN, RN, CWS, Kim RN, BSN Entered By: Gretta Cool, BSN, RN, CWS, Kim on 04/26/2020 14:58:32 [name], [name] ([id number]) [name], [name]  ([id number]) -------------------------------------------------------------------------------- Multi-Disciplinary Care Plan Details Patient Name: [name] Date of Service: 04/26/2020 2:15 PM Medical Record Number: [id number] Patient Account Number: [id number] Date of Birth/Sex: [birth year] (74 y.o. F) Treating RN: Cornell Barman Primary Care Katoria Yetman: [name] Other Clinician: Referring Jaysen Wey: [name] Treating Shakendra Griffeth/Extender: Melburn Hake, HOYT Weeks in Treatment: 7 Active Inactive Necrotic Tissue Nursing Diagnoses: Impaired tissue integrity related to necrotic/devitalized tissue Knowledge deficit related to management of necrotic/devitalized tissue Goals: Necrotic/devitalized tissue will be minimized in the wound bed Date Initiated: 03/06/2020 Target Resolution Date: 05/11/2020 Goal Status: Active Patient/caregiver will verbalize understanding of reason and process for debridement of necrotic tissue Date Initiated: 03/06/2020 Date Inactivated: 04/10/2020 Target Resolution Date: 03/06/2020 Goal Status: Met Interventions: Assess patient pain level pre-, during and post procedure and prior to discharge Provide education on necrotic tissue and debridement process Treatment Activities: Apply topical anesthetic as ordered : 03/06/2020 Notes: Pain, Acute or Chronic Nursing Diagnoses: Pain Management - Non-cyclic Acute (Procedural) Pain, acute or chronic: actual or potential Goals: Patient will verbalize adequate pain control and receive pain control interventions during procedures as needed Date Initiated: 03/06/2020 Target Resolution Date: 05/11/2020 Goal Status: Active Interventions: Reposition patient for comfort Notes: Venous Leg Ulcer Nursing Diagnoses: Potential for venous Insuffiency (use before diagnosis confirmed) Goals: Patient will maintain optimal edema control Date Initiated: 03/06/2020 Target Resolution Date: 05/11/2020 Goal Status:  Active Interventions: Assess peripheral edema status every visit. Compression as ordered Treatment Activities: [name], [name] ([id number]) Therapeutic compression applied : 03/06/2020 Notes: Electronic Signature(s) Signed: 04/26/2020 6:39:29 PM By: Gretta Cool, BSN, RN, CWS, Kim RN, BSN Entered By: Gretta Cool, BSN, RN, CWS, Kim on 04/26/2020 14:58:22 [name] ([id number]) -------------------------------------------------------------------------------- Pain Assessment Details Patient Name: [name] Date of Service: 04/26/2020 2:15 PM Medical Record Number: [id number] Patient Account Number: [id number] Date of Birth/Sex: May 30, 1946 (74 y.o. F) Treating RN: Cornell Barman Primary Care Zaiah Eckerson: [name] Other Clinician: Referring Tycho Cheramie: [name] Treating Ajay Strubel/Extender: Melburn Hake, HOYT Weeks in Treatment: 7 Active Problems Location of Pain Severity and Description of Pain Patient Has Paino Yes Site Locations Pain Location: Pain in Ulcers With Dressing Change: Yes Duration of the Pain. Constant / Intermittento Constant Rate the pain. Current Pain Level: 10 Worst Pain Level: 10 Least Pain Level: 5 Tolerable Pain Level: 8 Character of Pain Describe the Pain: Burning, Throbbing Pain Management and Medication Current Pain Management: Medication: Yes Rest: Yes How does your wound impact your activities of daily livingo Sleep: Yes  Notes states pain is 10 today and constant. Affects her sleep. short term relief with OTC pain meds and uses "tiger balm." Electronic Signature(s) Signed: 04/27/2020 8:16:16 AM By: Gretta Cool, BSN, RN, CWS, Kim RN, BSN Previous Signature: 04/27/2020 8:00:18 AM Version By: Darci Needle Entered By: Gretta Cool BSN, RN, CWS, Kim on 04/27/2020 08:16:16 [name] ([id number]) -------------------------------------------------------------------------------- Patient/Caregiver Education Details Patient Name: [name] Date of Service:  04/26/2020 2:15 PM Medical Record Number: [id number] Patient Account Number: [id number] Date of Birth/Gender: 1946-05-06 (74 y.o. F) Treating RN: Cornell Barman Primary Care Physician: [name] Other Clinician: Referring Physician: [name] Treating Physician/Extender: Sharalyn Ink in Treatment: 7 Education Assessment Education Provided To: Patient Education Topics Provided Venous: Handouts: Controlling Swelling with Multilayered Compression Wraps Methods: Demonstration Responses: State content correctly Electronic Signature(s) Signed: 04/26/2020 6:39:29 PM By: Gretta Cool, BSN, RN, CWS, Kim RN, BSN Entered By: Gretta Cool, BSN, RN, CWS, Kim on 04/26/2020 15:02:18 [name] ([id number]) -------------------------------------------------------------------------------- Wound Assessment Details Patient Name: [name] Date of Service: 04/26/2020 2:15 PM Medical Record Number: [id number] Patient Account Number: [id number] Date of Birth/Sex: 11/02/1945 (74 y.o. F) Treating RN: Cornell Barman Primary Care Makira Holleman: [name] Other Clinician: Referring Keni Elison: [name] Treating Marik Sedore/Extender: Melburn Hake, HOYT Weeks in Treatment: 7 Wound Status Wound Number: 1 Primary Venous Leg Ulcer Etiology: Wound Location: Left, Posterior Lower Leg Wound Open Wounding Event: Gradually Appeared Status: Date Acquired: 03/06/2020 Comorbid Cataracts, Congestive Heart Failure, Hypertension, Type II Weeks Of Treatment: 7 History: Diabetes, Osteoarthritis, Neuropathy, Received Clustered Wound: No Chemotherapy, Received Radiation Photos Wound Measurements Length: (cm) 1.1 Width: (cm) 0.9 Depth: (cm) 0.1 Area: (cm) 0.778 Volume: (cm) 0.078 % Reduction in Area: 36.5% % Reduction in Volume: 36.6% Epithelialization: Small (1-33%) Wound Description Classification: Full Thickness Without Exposed Support Structu Wound Margin: Flat and Intact Exudate Amount: Medium Exudate Type:  Serous Exudate Color: amber res Foul Odor After Cleansing: No Slough/Fibrino Yes Wound Bed Granulation Amount: Medium (34-66%) Exposed Structure Granulation Quality: Red Fascia Exposed: No Necrotic Amount: Small (1-33%) Fat Layer (Subcutaneous Tissue) Exposed: Yes Necrotic Quality: Adherent Slough Tendon Exposed: No Muscle Exposed: No Joint Exposed: No Bone Exposed: No Treatment Notes Wound #1 (Left, Posterior Lower Leg) Notes Prisma, abd, 3 layer left with unna to anchor Electronic Signature(s) [name], [name] ([id number]) Signed: 04/27/2020 8:16:27 AM By: Gretta Cool, BSN, RN, CWS, Kim RN, BSN Previous Signature: 04/27/2020 8:00:18 AM Version By: Darci Needle Entered By: Gretta Cool BSN, RN, CWS, Kim on 04/27/2020 08:16:27 [name] ([id number]) -------------------------------------------------------------------------------- [place] Details Patient Name: [name] Date of Service: 04/26/2020 2:15 PM Medical Record Number: [id number] Patient Account Number: [id number] Date of Birth/Sex: Jul 25, 1946 (74 y.o. F) Treating RN: Cornell Barman Primary Care Nevea Spiewak: [name] Other Clinician: Referring Arnesia Vincelette: [name] Treating Salley Boxley/Extender: STONE III, HOYT Weeks in Treatment: 7 Vital Signs Time Taken: 02:00 Temperature (F): 98.8 Height (in): 69 Pulse (bpm): 64 Weight (lbs): 263 Respiratory Rate (breaths/min): 20 Body Mass Index (BMI): 38.8 Blood Pressure (mmHg): 144/71 Reference Range: 80 - 120 mg / dl Electronic Signature(s) Signed: 04/27/2020 8:16:07 AM By: Gretta Cool, BSN, RN, CWS, Kim RN, BSN Previous Signature: 04/27/2020 8:00:18 AM Version By: Darci Needle Entered By: Gretta Cool, BSN, RN, CWS, Kim on 04/27/2020 08:16:07

## 2020-05-03 ENCOUNTER — Encounter: Payer: Medicare HMO | Admitting: Physician Assistant

## 2020-05-03 DIAGNOSIS — I872 Venous insufficiency (chronic) (peripheral): Secondary | ICD-10-CM | POA: Diagnosis not present

## 2020-05-03 NOTE — Progress Notes (Addendum)
TYE, JUAREZ ([id number]) Visit Report for 05/03/2020 Chief Complaint Document Details Patient Name: [name] Date of Service: 05/03/2020 3:30 PM Medical Record Number: [id number] Patient Account Number: [id number] Date of Birth/Sex: November 03, 1945 (74 y.o. F) Treating RN: Cornell Barman Primary Care Provider: [name] Other Clinician: Referring Provider: [name] Treating Provider/Extender: Melburn Hake, Cherrish Vitali Weeks in Treatment: 8 Information Obtained from: Patient Chief Complaint Left LE Ulcer Electronic Signature(s) Signed: 05/03/2020 3:39:43 PM By: Worthy Keeler PA-C Entered By: Worthy Keeler on 05/03/2020 15:39:43 [name] ([id number]) -------------------------------------------------------------------------------- Debridement Details Patient Name: [name] Date of Service: 05/03/2020 3:30 PM Medical Record Number: [id number] Patient Account Number: [id number] Date of Birth/Sex: 1946-07-14 (74 y.o. F) Treating RN: Grover Canavan Primary Care Provider: [name] Other Clinician: Referring Provider: [name] Treating Provider/Extender: Melburn Hake, Jaquel Coomer Weeks in Treatment: 8 Debridement Performed for Wound #1 Left,Posterior Lower Leg Assessment: Performed By: Physician STONE III, Tyechia Allmendinger E., PA-C Debridement Type: Debridement Severity of Tissue Pre Debridement: Fat layer exposed Level of Consciousness (Pre- Awake and Alert procedure): Pre-procedure Verification/Time Out Yes - 16:15 Taken: Start Time: 16:16 Pain Control: Lidocaine Total Area Debrided (L x W): 1 (cm) x 1 (cm) = 1 (cm) Tissue and other material Slough, Subcutaneous, Slough debrided: Level: Skin/Subcutaneous Tissue Debridement Description: Excisional Instrument: Curette Bleeding: Minimum Hemostasis Achieved: Pressure Procedural Pain: 0 Post Procedural Pain: 0 Response to Treatment: Procedure was tolerated well Level of Consciousness (Post- Awake and  Alert procedure): Post Debridement Measurements of Total Wound Length: (cm) 1 Width: (cm) 1 Depth: (cm) 0.2 Volume: (cm) 0.157 Character of Wound/Ulcer Post Debridement: Improved Severity of Tissue Post Debridement: Fat layer exposed Post Procedure Diagnosis Same as Pre-procedure Electronic Signature(s) Signed: 05/03/2020 4:54:16 PM By: Grover Canavan Signed: 05/03/2020 5:28:41 PM By: Worthy Keeler PA-C Entered By: Grover Canavan on 05/03/2020 16:18:12 [name] ([id number]) -------------------------------------------------------------------------------- HPI Details Patient Name: [name] Date of Service: 05/03/2020 3:30 PM Medical Record Number: [id number] Patient Account Number: [id number] Date of Birth/Sex: 12-06-1945 (74 y.o. F) Treating RN: Cornell Barman Primary Care Provider: [name] Other Clinician: Referring Provider: [name] Treating Provider/Extender: Melburn Hake, Ethelean Colla Weeks in Treatment: 8 History of Present Illness HPI Description: 03/15/2020 upon evaluation today patient presents today for initial evaluation here in our clinic concerning a wound that is on the left posterior lower extremity. Unfortunately this has been giving the patient some discomfort at this point which she notes has been affected her pretty much daily. The patient does have a history of venous insufficiency, hypertension, congestive heart failure, and a history of breast cancer. Fortunately there does not appear to be evidence of active infection at this time which is great news. No fevers, chills, nausea, vomiting, or diarrhea. Wound is not extremely large but does have some slough covering the surface of the wound. This is going require sharp debridement today. 03/15/2020 upon evaluation today patient appears to be doing fairly well in regard to her wound. She does have some slough noted on the surface of the wound currently but I do believe the Iodoflex has been beneficial  over the past week. There is no signs of active infection at this time which is great news and overall very pleased with the progress. No fevers, chills, nausea, vomiting, or diarrhea. 04/02/2020 upon evaluation today patient appears to be doing a little better in regard to her wound size wise this is not tremendously smaller she does have some slough buildup on the surface of the wound. With that being said this  is going require some sharp debridement today to clear away some of the necrotic debris. Fortunately there is no evidence of active infection at this time. No fevers, chills, nausea, vomiting, or diarrhea. 04/10/2020 on evaluation today patient appears to be doing well with regard to her ulcer which is measuring somewhat smaller today. She actually has more granulation tissue noted which is also good news is no need for sharp debridement today. Fortunately there is no evidence of infection either which is also excellent. No fevers, chills, nausea, vomiting, or diarrhea. 04/26/2020 on evaluation today patient's wound actually is showing signs of good improvement which is great news. There does not appear to be any evidence of active infection. I do believe she is tolerating the collagen at this point. 05/03/2020 upon evaluation today patient's wound actually showed signs of good granulation at this time there does not appear to be any evidence of active infection which is great news and overall very pleased with where things stand. With that being said I do believe that the patient is can require some sharp debridement today but fortunately nothing too significant. Electronic Signature(s) Signed: 05/03/2020 4:59:53 PM By: Worthy Keeler PA-C Entered By: Worthy Keeler on 05/03/2020 16:59:53 [name], [name] ([id number]) -------------------------------------------------------------------------------- Physical Exam Details Patient Name: [name] Date of Service: 05/03/2020 3:30  PM Medical Record Number: [id number] Patient Account Number: [id number] Date of Birth/Sex: Apr 20, 1946 (74 y.o. F) Treating RN: Cornell Barman Primary Care Provider: [name] Other Clinician: Referring Provider: [name] Treating Provider/Extender: Melburn Hake, Natasia Sanko Weeks in Treatment: 8 Notes Patient's wound bed showed signs again of minimal slough buildup on the surface of the wound I did perform sharp debridement to remove some of the dry skin around the edge as well as slough down to good subcutaneous tissue which he tolerated without complication post debridement the wound bed appears to be doing much better which is great news. Electronic Signature(s) Signed: 05/03/2020 5:00:15 PM By: Worthy Keeler PA-C Entered By: Worthy Keeler on 05/03/2020 17:00:15 [name] ([id number]) -------------------------------------------------------------------------------- Physician Orders Details Patient Name: [name] Date of Service: 05/03/2020 3:30 PM Medical Record Number: [id number] Patient Account Number: [id number] Date of Birth/Sex: 06-20-1946 (74 y.o. F) Treating RN: Grover Canavan Primary Care Provider: [name] Other Clinician: Referring Provider: [name] Treating Provider/Extender: Melburn Hake, Aneliese Beaudry Weeks in Treatment: 8 Verbal / Phone Orders: No Diagnosis Coding ICD-10 Coding Code Description I87.2 Venous insufficiency (chronic) (peripheral) L97.822 Non-pressure chronic ulcer of other part of left lower leg with fat layer exposed I10 Essential (primary) hypertension I50.42 Chronic combined systolic (congestive) and diastolic (congestive) heart failure C50.919 Malignant neoplasm of unspecified site of unspecified female breast Wound Cleansing o Cleanse wound with mild soap and water Anesthetic (add to Medication List) Wound #1 Left,Posterior Lower Leg o Topical Lidocaine 4% cream applied to wound bed prior to debridement (In Clinic Only). Primary  Wound Dressing Wound #1 Left,Posterior Lower Leg o Silver Collagen Secondary Dressing Wound #1 Left,Posterior Lower Leg o ABD pad Dressing Change Frequency Wound #1 Left,Posterior Lower Leg o Change dressing every week - in clinic and nurse visit as needed Follow-up Appointments Wound #1 Left,Posterior Lower Leg o Return Appointment in 1 week. Edema Control Wound #1 Left,Posterior Lower Leg o 3 Layer Compression System - Left Lower Extremity - TCA to leg to help with itching o Elevate legs to the level of the heart and pump ankles as often as possible Additional Orders / Instructions Wound #1 Left,Posterior Lower Leg o Vitamin  A; Vitamin C, Zinc o Increase protein intake. Electronic Signature(s) Signed: 05/03/2020 4:54:16 PM By: Grover Canavan Signed: 05/03/2020 5:28:41 PM By: Worthy Keeler PA-C Entered By: Grover Canavan on 05/03/2020 16:18:56 [name] ([id number]) -------------------------------------------------------------------------------- Problem List Details Patient Name: [name] Date of Service: 05/03/2020 3:30 PM Medical Record Number: [id number] Patient Account Number: [id number] Date of Birth/Sex: 1945-12-09 (74 y.o. F) Treating RN: Cornell Barman Primary Care Provider: [name] Other Clinician: Referring Provider: [name] Treating Provider/Extender: Melburn Hake, Latausha Flamm Weeks in Treatment: 8 Active Problems ICD-10 Encounter Code Description Active Date MDM Diagnosis I87.2 Venous insufficiency (chronic) (peripheral) 03/06/2020 No Yes L97.822 Non-pressure chronic ulcer of other part of left lower leg with fat layer 03/06/2020 No Yes exposed I10 Essential (primary) hypertension 03/06/2020 No Yes I50.42 Chronic combined systolic (congestive) and diastolic (congestive) heart 03/06/2020 No Yes failure C50.919 Malignant neoplasm of unspecified site of unspecified female breast 03/06/2020 No Yes Inactive Problems Resolved  Problems Electronic Signature(s) Signed: 05/03/2020 3:39:34 PM By: Worthy Keeler PA-C Entered By: Worthy Keeler on 05/03/2020 15:39:34 [name] ([id number]) -------------------------------------------------------------------------------- Progress Note Details Patient Name: [name] Date of Service: 05/03/2020 3:30 PM Medical Record Number: [id number] Patient Account Number: [id number] Date of Birth/Sex: [birth year] (74 y.o. F) Treating RN: Cornell Barman Primary Care Provider: [name] Other Clinician: Referring Provider: [name] Treating Provider/Extender: Melburn Hake, Haedyn Ancrum Weeks in Treatment: 8 Subjective Chief Complaint Information obtained from Patient Left LE Ulcer History of Present Illness (HPI) 03/15/2020 upon evaluation today patient presents today for initial evaluation here in our clinic concerning a wound that is on the left posterior lower extremity. Unfortunately this has been giving the patient some discomfort at this point which she notes has been affected her pretty much daily. The patient does have a history of venous insufficiency, hypertension, congestive heart failure, and a history of breast cancer. Fortunately there does not appear to be evidence of active infection at this time which is great news. No fevers, chills, nausea, vomiting, or diarrhea. Wound is not extremely large but does have some slough covering the surface of the wound. This is going require sharp debridement today. 03/15/2020 upon evaluation today patient appears to be doing fairly well in regard to her wound. She does have some slough noted on the surface of the wound currently but I do believe the Iodoflex has been beneficial over the past week. There is no signs of active infection at this time which is great news and overall very pleased with the progress. No fevers, chills, nausea, vomiting, or diarrhea. 04/02/2020 upon evaluation today patient appears to be doing a little  better in regard to her wound size wise this is not tremendously smaller she does have some slough buildup on the surface of the wound. With that being said this is going require some sharp debridement today to clear away some of the necrotic debris. Fortunately there is no evidence of active infection at this time. No fevers, chills, nausea, vomiting, or diarrhea. 04/10/2020 on evaluation today patient appears to be doing well with regard to her ulcer which is measuring somewhat smaller today. She actually has more granulation tissue noted which is also good news is no need for sharp debridement today. Fortunately there is no evidence of infection either which is also excellent. No fevers, chills, nausea, vomiting, or diarrhea. 04/26/2020 on evaluation today patient's wound actually is showing signs of good improvement which is great news. There does not appear to be any evidence of active infection. I  do believe she is tolerating the collagen at this point. 05/03/2020 upon evaluation today patient's wound actually showed signs of good granulation at this time there does not appear to be any evidence of active infection which is great news and overall very pleased with where things stand. With that being said I do believe that the patient is can require some sharp debridement today but fortunately nothing too significant. Objective Constitutional Vitals Time Taken: 3:45 PM, Height: 69 in, Weight: 263 lbs, BMI: 38.8, Temperature: 98.5 F, Pulse: 71 bpm, Respiratory Rate: 16 breaths/min, Blood Pressure: 149/67 mmHg. Integumentary (Hair, Skin) Wound #1 status is Open. Original cause of wound was Gradually Appeared. The wound is located on the Left,Posterior Lower Leg. The wound measures 1cm length x 1cm width x 0.1cm depth; 0.785cm^2 area and 0.079cm^3 volume. There is Fat Layer (Subcutaneous Tissue) exposed. There is no tunneling or undermining noted. There is a large amount of serous drainage noted.  The wound margin is flat and intact. There is medium (34-66%) red granulation within the wound bed. There is a medium (34-66%) amount of necrotic tissue within the wound bed including Adherent Slough. Assessment Active Problems ICD-10 Venous insufficiency (chronic) (peripheral) Non-pressure chronic ulcer of other part of left lower leg with fat layer exposed [name], [name] ([id number]) Essential (primary) hypertension Chronic combined systolic (congestive) and diastolic (congestive) heart failure Malignant neoplasm of unspecified site of unspecified female breast Procedures Wound #1 Pre-procedure diagnosis of Wound #1 is a Venous Leg Ulcer located on the Left,Posterior Lower Leg .Severity of Tissue Pre Debridement is: Fat layer exposed. There was a Excisional Skin/Subcutaneous Tissue Debridement with a total area of 1 sq cm performed by STONE III, Viviane Semidey E., PA-C. With the following instrument(s): Curette Material removed includes Subcutaneous Tissue and Slough and after achieving pain control using Lidocaine. A time out was conducted at 16:15, prior to the start of the procedure. A Minimum amount of bleeding was controlled with Pressure. The procedure was tolerated well with a pain level of 0 throughout and a pain level of 0 following the procedure. Post Debridement Measurements: 1cm length x 1cm width x 0.2cm depth; 0.157cm^3 volume. Character of Wound/Ulcer Post Debridement is improved. Severity of Tissue Post Debridement is: Fat layer exposed. Post procedure Diagnosis Wound #1: Same as Pre-Procedure Plan Wound Cleansing: Cleanse wound with mild soap and water Anesthetic (add to Medication List): Wound #1 Left,Posterior Lower Leg: Topical Lidocaine 4% cream applied to wound bed prior to debridement (In Clinic Only). Primary Wound Dressing: Wound #1 Left,Posterior Lower Leg: Silver Collagen Secondary Dressing: Wound #1 Left,Posterior Lower Leg: ABD pad Dressing Change  Frequency: Wound #1 Left,Posterior Lower Leg: Change dressing every week - in clinic and nurse visit as needed Follow-up Appointments: Wound #1 Left,Posterior Lower Leg: Return Appointment in 1 week. Edema Control: Wound #1 Left,Posterior Lower Leg: 3 Layer Compression System - Left Lower Extremity - TCA to leg to help with itching Elevate legs to the level of the heart and pump ankles as often as possible Additional Orders / Instructions: Wound #1 Left,Posterior Lower Leg: Vitamin A; Vitamin C, Zinc Increase protein intake. 1. I am going to recommend that we continue with the silver collagen dressing I think that seems to be doing a great job. 2. I am also can recommend that we continue with the ABD pad to cover as well as a 3 layer compression wrap that seems to be doing an excellent job for her. 3. I would also recommend the patient continue to elevate  her legs is much as possible to try to help keep edema under good control. We will see patient back for reevaluation in 1 week here in the clinic. If anything worsens or changes patient will contact our office for additional recommendations. Electronic Signature(s) Signed: 05/03/2020 5:01:16 PM By: Worthy Keeler PA-C Entered By: Worthy Keeler on 05/03/2020 17:01:16 [name] ([id number]) -------------------------------------------------------------------------------- SuperBill Details Patient Name: [name] Date of Service: 05/03/2020 Medical Record Number: [id number] Patient Account Number: [id number] Date of Birth/Sex: 1946-06-11 (74 y.o. F) Treating RN: Cornell Barman Primary Care Provider: [name] Other Clinician: Referring Provider: [name] Treating Provider/Extender: Melburn Hake, Cari Burgo Weeks in Treatment: 8 Diagnosis Coding ICD-10 Codes Code Description I87.2 Venous insufficiency (chronic) (peripheral) L97.822 Non-pressure chronic ulcer of other part of left lower leg with fat layer exposed I10  Essential (primary) hypertension I50.42 Chronic combined systolic (congestive) and diastolic (congestive) heart failure C50.919 Malignant neoplasm of unspecified site of unspecified female breast Facility Procedures CPT4 Code: [id number] Description: 02637 - DEB SUBQ TISSUE 20 SQ CM/< Modifier: Quantity: 1 CPT4 Code: Description: ICD-10 Diagnosis Description L97.822 Non-pressure chronic ulcer of other part of left lower leg with fat layer Modifier: exposed Quantity: Physician Procedures CPT4 Code: [id number] Description: 77412 - WC PHYS SUBQ TISS 20 SQ CM Modifier: Quantity: 1 CPT4 Code: Description: ICD-10 Diagnosis Description L97.822 Non-pressure chronic ulcer of other part of left lower leg with fat layer Modifier: exposed Quantity: Electronic Signature(s) Signed: 05/03/2020 5:01:40 PM By: Worthy Keeler PA-C Entered By: Worthy Keeler on 05/03/2020 17:01:40

## 2020-05-03 NOTE — Progress Notes (Addendum)
[name], [name] ([id number]) Visit Report for 05/03/2020 Arrival Information Details Patient Name: [name], [name] Date of Service: 05/03/2020 3:30 PM Medical Record Number: [id number] Patient Account Number: [id number] Date of Birth/Sex: February 16, 1946 (74 y.o. F) Treating RN: Grover Canavan Primary Care Oliva Montecalvo: [name] Other Clinician: Referring Haifa Hatton: [name] Treating Guneet Delpino/Extender: Melburn Hake, HOYT Weeks in Treatment: 8 Visit Information History Since Last Visit Added or deleted any medications: No Patient Arrived: Wheel Chair Had a fall or experienced change in No Arrival Time: 15:44 activities of daily living that may affect Accompanied By: friend risk of falls: Transfer Assistance: Manual Hospitalized since last visit: No Patient Requires Transmission-Based Precautions: No Has Compression in Place as Prescribed: Yes Patient Has Alerts: No Pain Present Now: Yes Electronic Signature(s) Signed: 05/03/2020 4:54:16 PM By: Grover Canavan Entered By: Grover Canavan on 05/03/2020 16:14:14 [name] ([id number]) -------------------------------------------------------------------------------- Encounter Discharge Information Details Patient Name: [name] Date of Service: 05/03/2020 3:30 PM Medical Record Number: [id number] Patient Account Number: [id number] Date of Birth/Sex: 13-May-1946 (74 y.o. F) Treating RN: Grover Canavan Primary Care [place] Perra: [name] Other Clinician: Referring Nyala Kirchner: [name] Treating Ritu Gagliardo/Extender: Melburn Hake, HOYT Weeks in Treatment: 8 Encounter Discharge Information Items Post Procedure Vitals Discharge Condition: Stable Temperature (F): 98.5 Ambulatory Status: Wheelchair Pulse (bpm): 71 Discharge Destination: Home Respiratory Rate (breaths/min): 16 Transportation: Private Auto Blood Pressure (mmHg): 149/67 Accompanied By: friend Schedule Follow-up Appointment: Yes Clinical Summary of  Care: Electronic Signature(s) Signed: 05/03/2020 4:54:16 PM By: Grover Canavan Entered By: Grover Canavan on 05/03/2020 16:20:57 [name] ([id number]) -------------------------------------------------------------------------------- Lower Extremity Assessment Details Patient Name: [name] Date of Service: 05/03/2020 3:30 PM Medical Record Number: [id number] Patient Account Number: [id number] Date of Birth/Sex: Jan 18, 1946 (74 y.o. F) Treating RN: Grover Canavan Primary Care Charly Holcomb: [name] Other Clinician: Referring Blanchard Willhite: [name] Treating Efrat Zuidema/Extender: Melburn Hake, HOYT Weeks in Treatment: 8 Edema Assessment Assessed: [Left: Yes] [Right: No] [Left: Edema] [Right: :] Calf Left: Right: Point of Measurement: 32 cm From Medial Instep 41 cm cm Ankle Left: Right: Point of Measurement: 12 cm From Medial Instep 25 cm cm Vascular Assessment Pulses: Dorsalis Pedis Palpable: [Left:Yes] Posterior Tibial Palpable: [Left:Yes] Electronic Signature(s) Signed: 05/03/2020 4:54:16 PM By: Grover Canavan Entered By: Grover Canavan on 05/03/2020 15:55:18 [name] ([id number]) -------------------------------------------------------------------------------- Multi Wound Chart Details Patient Name: [name] Date of Service: 05/03/2020 3:30 PM Medical Record Number: [id number] Patient Account Number: [id number] Date of Birth/Sex: [birth year] (74 y.o. F) Treating RN: Grover Canavan Primary Care Eunice Winecoff: [name] Other Clinician: Referring Shirely Toren: [name] Treating Aneliese Beaudry/Extender: STONE III, HOYT Weeks in Treatment: 8 Vital Signs Height(in): 69 Pulse(bpm): 11 Weight(lbs): 263 Blood Pressure(mmHg): 149/67 Body Mass Index(BMI): 39 Temperature(F): 98.5 Respiratory Rate(breaths/min): 16 Photos: [N/A:N/A] Wound Location: Left, Posterior Lower Leg N/A N/A Wounding Event: Gradually Appeared N/A N/A Primary Etiology: Venous  Leg Ulcer N/A N/A Comorbid History: Cataracts, Congestive Heart Failure, N/A N/A Hypertension, Type II Diabetes, Osteoarthritis, Neuropathy, Received Chemotherapy, Received Radiation Date Acquired: 03/06/2020 N/A N/A Weeks of Treatment: 8 N/A N/A Wound Status: Open N/A N/A Measurements L x W x D (cm) 1x1x0.1 N/A N/A Area (cm) : 0.785 N/A N/A Volume (cm) : 0.079 N/A N/A % Reduction in Area: 35.90% N/A N/A % Reduction in Volume: 35.80% N/A N/A Classification: Full Thickness Without Exposed N/A N/A Support Structures Exudate Amount: Large N/A N/A Exudate Type: Serous N/A N/A Exudate Color: amber N/A N/A Wound Margin: Flat and Intact N/A N/A Granulation Amount: Medium (34-66%) N/A N/A Granulation Quality: Red N/A N/A Necrotic Amount: Medium (  34-66%) N/A N/A Exposed Structures: Fat Layer (Subcutaneous Tissue): N/A N/A Yes Fascia: No Tendon: No Muscle: No Joint: No Bone: No Epithelialization: Small (1-33%) N/A N/A Treatment Notes Electronic Signature(s) Signed: 05/03/2020 4:54:16 PM By: Grover Canavan Entered By: Grover Canavan on 05/03/2020 16:14:41 [name], [name] ([id number]) [name], [name] ([id number]) -------------------------------------------------------------------------------- Multi-Disciplinary Care Plan Details Patient Name: [name] Date of Service: 05/03/2020 3:30 PM Medical Record Number: [id number] Patient Account Number: [id number] Date of Birth/Sex: 16-Sep-1945 (74 y.o. F) Treating RN: Grover Canavan Primary Care Vishruth Seoane: [name] Other Clinician: Referring Trenia Tennyson: [name] Treating Stormy Sabol/Extender: Melburn Hake, HOYT Weeks in Treatment: 8 Active Inactive Necrotic Tissue Nursing Diagnoses: Impaired tissue integrity related to necrotic/devitalized tissue Knowledge deficit related to management of necrotic/devitalized tissue Goals: Necrotic/devitalized tissue will be minimized in the wound bed Date Initiated: 03/06/2020 Target  Resolution Date: 05/11/2020 Goal Status: Active Patient/caregiver will verbalize understanding of reason and process for debridement of necrotic tissue Date Initiated: 03/06/2020 Date Inactivated: 04/10/2020 Target Resolution Date: 03/06/2020 Goal Status: Met Interventions: Assess patient pain level pre-, during and post procedure and prior to discharge Provide education on necrotic tissue and debridement process Treatment Activities: Apply topical anesthetic as ordered : 03/06/2020 Notes: Pain, Acute or Chronic Nursing Diagnoses: Pain Management - Non-cyclic Acute (Procedural) Pain, acute or chronic: actual or potential Goals: Patient will verbalize adequate pain control and receive pain control interventions during procedures as needed Date Initiated: 03/06/2020 Target Resolution Date: 05/11/2020 Goal Status: Active Interventions: Reposition patient for comfort Notes: Venous Leg Ulcer Nursing Diagnoses: Potential for venous Insuffiency (use before diagnosis confirmed) Goals: Patient will maintain optimal edema control Date Initiated: 03/06/2020 Target Resolution Date: 05/11/2020 Goal Status: Active Interventions: Assess peripheral edema status every visit. Compression as ordered Treatment Activities: [name], [name] ([id number]) Therapeutic compression applied : 03/06/2020 Notes: Electronic Signature(s) Signed: 05/03/2020 4:54:16 PM By: Grover Canavan Entered By: Grover Canavan on 05/03/2020 16:14:27 [name], [name] ([id number]) -------------------------------------------------------------------------------- Pain Assessment Details Patient Name: [name] Date of Service: 05/03/2020 3:30 PM Medical Record Number: [id number] Patient Account Number: [id number] Date of Birth/Sex: 23-Sep-1945 (74 y.o. F) Treating RN: Grover Canavan Primary Care Marna Weniger: [name] Other Clinician: Referring Daleon Willinger: [name] Treating Oluwatomisin Hustead/Extender: Melburn Hake, HOYT Weeks in  Treatment: 8 Active Problems Location of Pain Severity and Description of Pain Patient Has Paino Yes Site Locations Duration of the Pain. Constant / Intermittento Constant Rate the pain. Current Pain Level: 10 Pain Management and Medication Current Pain Management: Electronic Signature(s) Signed: 05/03/2020 4:54:16 PM By: Grover Canavan Entered By: Grover Canavan on 05/03/2020 15:46:29 [name] ([id number]) -------------------------------------------------------------------------------- Patient/Caregiver Education Details Patient Name: [name] Date of Service: 05/03/2020 3:30 PM Medical Record Number: [id number] Patient Account Number: [id number] Date of Birth/Gender: [birth year] (74 y.o. F) Treating RN: Grover Canavan Primary Care Physician: [name] Other Clinician: Referring Physician: [name] Treating Physician/Extender: Sharalyn Ink in Treatment: 8 Education Assessment Education Provided To: Patient Education Topics Provided Wound/Skin Impairment: Handouts: Caring for Your Ulcer Methods: Explain/Verbal Responses: State content correctly Electronic Signature(s) Signed: 05/03/2020 4:54:16 PM By: Grover Canavan Entered By: Grover Canavan on 05/03/2020 16:19:20 [name] ([id number]) -------------------------------------------------------------------------------- Wound Assessment Details Patient Name: [name] Date of Service: 05/03/2020 3:30 PM Medical Record Number: [id number] Patient Account Number: [id number] Date of Birth/Sex: [birth year] (74 y.o. F) Treating RN: Grover Canavan Primary Care Derreon Consalvo: [name] Other Clinician: Referring Ajani Schnieders: [name] Treating Nyala Kirchner/Extender: STONE III, HOYT Weeks in Treatment: 8 Wound Status Wound Number: 1 Primary Venous Leg Ulcer Etiology: Wound Location: Left, Posterior Lower Leg  Wound Open Wounding Event: Gradually Appeared Status: Date Acquired:  03/06/2020 Comorbid Cataracts, Congestive Heart Failure, Hypertension, Type II Weeks Of Treatment: 8 History: Diabetes, Osteoarthritis, Neuropathy, Received Clustered Wound: No Chemotherapy, Received Radiation Photos Wound Measurements Length: (cm) 1 Width: (cm) 1 Depth: (cm) 0.1 Area: (cm) 0.785 Volume: (cm) 0.079 % Reduction in Area: 35.9% % Reduction in Volume: 35.8% Epithelialization: Small (1-33%) Tunneling: No Undermining: No Wound Description Classification: Full Thickness Without Exposed Support Structu Wound Margin: Flat and Intact Exudate Amount: Large Exudate Type: Serous Exudate Color: amber res Foul Odor After Cleansing: No Slough/Fibrino Yes Wound Bed Granulation Amount: Medium (34-66%) Exposed Structure Granulation Quality: Red Fascia Exposed: No Necrotic Amount: Medium (34-66%) Fat Layer (Subcutaneous Tissue) Exposed: Yes Necrotic Quality: Adherent Slough Tendon Exposed: No Muscle Exposed: No Joint Exposed: No Bone Exposed: No Treatment Notes Wound #1 (Left, Posterior Lower Leg) Notes Prisma, abd, 3 layer left with unna to anchor Electronic Signature(s) [name], [name] ([id number]) Signed: 05/03/2020 4:54:16 PM By: Grover Canavan Entered By: Grover Canavan on 05/03/2020 15:53:54 [name] ([id number]) -------------------------------------------------------------------------------- [place] Details Patient Name: [name] Date of Service: 05/03/2020 3:30 PM Medical Record Number: [id number] Patient Account Number: [id number] Date of Birth/Sex: Nov 29, 1945 (74 y.o. F) Treating RN: Grover Canavan Primary Care Kijana Cromie: [name] Other Clinician: Referring Charmaine Placido: [name] Treating Dacoda Spallone/Extender: Melburn Hake, HOYT Weeks in Treatment: 8 Vital Signs Time Taken: 15:45 Temperature (F): 98.5 Height (in): 69 Pulse (bpm): 71 Weight (lbs): 263 Respiratory Rate (breaths/min): 16 Body Mass Index (BMI): 38.8 Blood Pressure  (mmHg): 149/67 Reference Range: 80 - 120 mg / dl Electronic Signature(s) Signed: 05/03/2020 4:54:16 PM By: Grover Canavan Entered By: Grover Canavan on 05/03/2020 15:46:12

## 2020-05-08 NOTE — Progress Notes (Signed)
Date:  05/09/2020   ID:  [name], DOB Dec 04, 1945, MRN [id number]  Patient Location:  [name] [address]   Provider location:   [facility], [place] office  PCP:  Donnie Coffin, MD  Cardiologist:  Patsy Baltimore   Chief Complaint  Patient presents with  . office visit    6 month F/U; Meds verbally reviewed with patient.    History of Present Illness:    [name] is a 74 y.o. female past medical history of Medication confusion/Medication noncompliance No family to help, uses [employer] for transportation morbid obesity,  severe lower extremity edema, dating back to 5631 systolic CHF, ejection fraction 25% in 2012  , Up to EF 50% to 55% in 03/2017 sleep apnea, scheduled for repeat testing. Previous machine broke pulmonary disease,  severe hypertension,  likely secondary to medication noncompliance who presents For follow-up of her hypertension, cardiomyopathy, edema  Lots of ice tea, high fluid intake Taking torsemide 40 daily Weight down 40 pounds in the past year, previously up to 300 pounds now up to 60 Doing exercise in the Am Cut out bread, Cut out [employer]  presents in wheelchair Still with difficulty walking  Reports having some Blistering on legs, followed by wound clinic Has leg wraps on the left Was never seen for lymphedema, previous referral to vascular Does not have lymphedema compression pumps She is very interested as she is feeling more debilitated from her leg swelling and now with wounds and leg wraps followed in the wound clinic  Not using her CPAP Previously received a new machine from [facility] mask  too small   Lab work reviewed Renal function high end of the range concerning for prerenal state  EKG personally reviewed by myself on todays visit Shows sinus bradycardia rate 56 bpm nonspecific T wave abnormality 1, aVL, V6   Other past medical history reviewed hospitalization July 2018 for  TIA and chest pain Slurring, head hurt, H/A, felt hot She had MRI brain without acute finding Neurology consult with no clear recommendations Chest pain resolved without intervention Carotid ultrasound without significant stenosis SNF was recommended   Prior CV studies:   The following studies were reviewed today:    Past Medical History:  Diagnosis Date  . Abnormality of gait   . 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   . Breast cancer ([place]) 2019   right breast  . CHF (congestive heart failure) ([place])    March 28, 2017 echo: EF 50-55%; 1) echo 07/2011: EF 20-25%, mild concentric hypertrophy, diffuse HK, regional WMA cannot be excluded, grade 1 DD, mitral valve with mild regurg, LA mildly dilated). 2) EF 40-45%, mild AS,                    . Complication of anesthesia 2006   pt states "hard to wake up" after L breast surgery   . Edema   . GERD (gastroesophageal reflux disease)   . Headache   . Heart murmur   . Hypertension   . Hypokinesis    global, EF 35-45 % from Echo.  . Malignant neoplasm of breast (female), unspecified site 06/14/2018   RIGHT T1b, N0; ER/PR positive, HER-2 negative.  . Neuropathy   . Obesity   . Pain in joint, site unspecified   . Personal history of chemotherapy 2006  left breast  . Personal history of radiation therapy 2019   right breast  . Personal history of radiation therapy 2006   left breast  . Pneumonia   . Sleep apnea   . Tinea pedis   . Unspecified hearing loss    bilateral   Past Surgical History:  Procedure Laterality Date  . BREAST BIOPSY Right 2019   11 oc-INVASIVE MAMMARY CARCINOMA, NO SPECIAL TYPE  . BREAST BIOPSY Right 2019   1030 oc- neg  . BREAST LUMPECTOMY  2006   left breast   . CATARACT EXTRACTION     left eye  . HERNIA REPAIR  05/25/2006   Incarcerated omentum in ventral hernia, Ventralight mesh,  combined lap/ open with omental resection.   . PARTIAL MASTECTOMY WITH NEEDLE LOCALIZATION Right 06/14/2018   Procedure: PARTIAL MASTECTOMY WITH NEEDLE LOCALIZATION;  Surgeon: Byrnett, Jeffrey W, MD;  Location: [facility] ORS;  Service: General;  Laterality: Right;  . PORT-A-CATH REMOVAL  05/25/2006  . SENTINEL NODE BIOPSY Right 06/14/2018   Procedure: SENTINEL NODE BIOPSY;  Surgeon: Byrnett, Jeffrey W, MD;  Location: [facility] ORS;  Service: General;  Laterality: Right;  . TUBAL LIGATION       Current Meds  Medication Sig  . anastrozole (ARIMIDEX) 1 MG tablet TAKE 1 TABLET BY MOUTH DAILY. PATIENT MUST MAKE AN APPT TO SEE DR RAO  . aspirin EC 81 MG tablet Take 81 mg by mouth daily.    . cloNIDine (CATAPRES) 0.2 MG tablet Take 1 tablet (0.2 mg total) by mouth 2 (two) times daily.  . clotrimazole (LOTRIMIN) 1 % cream Apply 1 application topically 2 (two) times daily.   . [employer] CALCIUM 600 & VITAMIN D3 600-800 MG-UNIT TABS TAKE 1 TABLET BY MOUTH TWICE A DAY  . diclofenac Sodium (VOLTAREN) 1 % GEL Apply 1 application topically as needed.  . Incontinence Supply Disposable (BLADDER CONTROL PADS EX ABSORB) MISC   . loratadine (CLARITIN) 10 MG tablet Take 10 mg by mouth daily.  . losartan (COZAAR) 25 MG tablet Take 25 mg by mouth daily.   . methocarbamol (ROBAXIN) 500 MG tablet Take 500 mg by mouth 2 (two) times daily as needed.  . omeprazole (PRILOSEC) 20 MG capsule Take 20 mg by mouth daily.  . oxybutynin (DITROPAN) 5 MG tablet Take 5 mg by mouth 2 (two) times daily.  . potassium chloride SA (K-DUR) 20 MEQ tablet Take 1 tablet (20 meq) by mouth once daily  . tiZANidine (ZANAFLEX) 2 MG tablet 1 TAB BY MOUTH 3 TIMES DAILY AS NEEDED MUSCLE SPASM  . torsemide (DEMADEX) 20 MG tablet TAKE 1 TABLET BY MOUTH TWICE A DAY  . triamcinolone cream (KENALOG) 0.1 % Apply 1 application topically 2 (two) times daily. Back of legs     Allergies:   Patient has no known allergies.   Social History   Tobacco Use  . Smoking  status: Former Smoker    Packs/day: 0.50    Years: 30.00    Pack years: 15.00    Types: Cigarettes    Quit date: 07/14/2000    Years since quitting: 19.8  . Smokeless tobacco: Never Used  Vaping Use  . Vaping Use: Never used  Substance Use Topics  . Alcohol use: No  . Drug use: Not Currently    Types: Marijuana    Comment: endorses prior marijuana use 1999     Family Hx: The patient's family history includes Breast cancer (age of onset: 27) in an other family member; Cataracts in   her mother; Coronary artery disease in an other family member; Hypertension in her mother.  ROS:   Please see the history of present illness.    Review of Systems  Constitutional: Negative.   HENT: Negative.   Respiratory: Positive for shortness of breath.   Cardiovascular: Positive for leg swelling.  Gastrointestinal: Negative.   Musculoskeletal: Negative.   Neurological: Negative.   Psychiatric/Behavioral: Negative.   All other systems reviewed and are negative.    Labs/Other Tests and Data Reviewed:    Recent Labs: 02/10/2020: ALT 12; BUN 24; Creatinine, Ser 1.41; Hemoglobin 12.4; Platelets 327; Potassium 4.4; Sodium 139   Recent Lipid Panel Lab Results  Component Value Date/Time   CHOL 141 03/28/2017 06:39 AM   TRIG 40 03/28/2017 06:39 AM   HDL 42 03/28/2017 06:39 AM   CHOLHDL 3.4 03/28/2017 06:39 AM   LDLCALC 91 03/28/2017 06:39 AM    Wt Readings from Last 3 Encounters:  05/09/20 261 lb (118.4 kg)  01/16/20 291 lb (132 kg)  11/14/19 (!) 300 lb 4 oz (136.2 kg)     Exam:    Vital Signs: Vital signs may also be detailed in the HPI BP 120/78 (BP Location: Right Arm, Patient Position: Sitting, Cuff Size: Large)   Pulse (!) 56   Ht 5' 9" (1.753 m)   Wt 261 lb (118.4 kg) Comment: Unable to stand on scale-wt reported by pt  SpO2 98%   BMI 38.54 kg/m   Constitutional:  oriented to person, place, and time. No distress.  Presents in a wheelchair HENT:  Head: Grossly normal Eyes:   no discharge. No scleral icterus.  Neck: No JVD, no carotid bruits  Cardiovascular: Regular rate and rhythm, no murmurs appreciated 1+ pitting edema bilaterally, left leg in a wrap up to the knee Pulmonary/Chest: Clear to auscultation bilaterally, no wheezes or rails Abdominal: Soft.  no distension.  no tenderness.  Musculoskeletal: Normal range of motion Neurological:  normal muscle tone. Coordination normal. No atrophy Skin: Skin warm and dry Psychiatric: normal affect, pleasant   ASSESSMENT & PLAN:    Chronic diastolic CHF Given renal function high end of the range, continue torsemide 40 daily, we will not escalate dosing Leg swelling likely component of lymphedema Sits for much of the day, has right wraps in place on the left, followed by the wound clinic Recommend leg elevation, She is unable to place TED hose Recommend referral to vein and vascular for consideration of lymphedema compression pumps  Morbid obesity (HCC) Prior weight 300 pounds, she is more active, does exercise in the morning, changed her diet, weight up 260 pounds She reports that she would like to get down to 200 pounds Losing several pounds per month at this rate  SOB (shortness of breath) Deconditioned, Recommend regular walking program  Essential hypertension Blood pressure elevated, suggested she monitor pressure at home, extra torsemide as detailed above  OSA on CPAP Current mask on new machine does not fit her face Has not been using it May need referral to pulmonary  Lymphedema Referral placed to vein and vascular for consideration of lymphedema compression pumps     Total encounter time more than 25 minutes  Greater than 50% was spent in counseling and coordination of care with the patient   Signed, Timothy Gollan, MD  05/09/2020 10:34 AM    [facility] [address], [place], [place] [address]     

## 2020-05-09 ENCOUNTER — Ambulatory Visit (INDEPENDENT_AMBULATORY_CARE_PROVIDER_SITE_OTHER): Payer: Medicare HMO | Admitting: Cardiovascular Disease

## 2020-05-09 ENCOUNTER — Encounter: Payer: Self-pay | Admitting: Cardiovascular Disease

## 2020-05-09 ENCOUNTER — Other Ambulatory Visit: Payer: Self-pay

## 2020-05-09 VITALS — BP 120/78 | HR 56 | Ht 69.0 in | Wt 261.0 lb

## 2020-05-09 DIAGNOSIS — I890 Lymphedema, not elsewhere classified: Secondary | ICD-10-CM

## 2020-05-09 DIAGNOSIS — G459 Transient cerebral ischemic attack, unspecified: Secondary | ICD-10-CM

## 2020-05-09 DIAGNOSIS — I5032 Chronic diastolic (congestive) heart failure: Secondary | ICD-10-CM | POA: Diagnosis not present

## 2020-05-09 DIAGNOSIS — I10 Essential (primary) hypertension: Secondary | ICD-10-CM

## 2020-05-09 DIAGNOSIS — M7989 Other specified soft tissue disorders: Secondary | ICD-10-CM

## 2020-05-09 DIAGNOSIS — R0602 Shortness of breath: Secondary | ICD-10-CM

## 2020-05-09 NOTE — Patient Instructions (Addendum)
Medication Instructions:  No changes  If you need a refill on your cardiac medications before your next appointment, please call your pharmacy.    Lab work: No new labs needed   If you have labs (blood work) drawn today and your tests are completely normal, you will receive your results only by: Marland Kitchen MyChart Message (if you have MyChart) OR . A paper copy in the mail If you have any lab test that is abnormal or we need to change your treatment, we will call you to review the results.   Testing/Procedures: - You have been referred to [facility],( Dr. Lucky Cowboy Ronalee Belts) for leg swelling, lymphedema,  lymphedema compression pumps - their office should reach out to you directly to schedule - if you do not hear anything from them in the next 2 weeks, please call to let us know and we will follow up on this for you    Follow-Up: At [facility], you and your health needs are our priority.  As part of our continuing mission to provide you with exceptional heart care, we have created designated Provider Care Teams.  These Care Teams include your primary Cardiologist (physician) and Advanced Practice Providers (APPs -  Physician Assistants and Nurse Practitioners) who all work together to provide you with the care you need, when you need it.  . You will need a follow up appointment in 6 months  . Providers on your designated Care Team:   . Murray Hodgkins, NP . Christell Faith, PA-C . Marrianne Mood, PA-C  Any Other Special Instructions Will Be Listed Below (If Applicable).  COVID-19 Vaccine Information can be found at: [email or web address].uk For questions related to vaccine distribution or appointments, please email [email or web address] or call [phone].

## 2020-05-11 ENCOUNTER — Other Ambulatory Visit: Payer: Self-pay

## 2020-05-11 ENCOUNTER — Encounter: Payer: Medicare HMO | Attending: Physician Assistant | Admitting: Physician Assistant

## 2020-05-11 DIAGNOSIS — I5042 Chronic combined systolic (congestive) and diastolic (congestive) heart failure: Secondary | ICD-10-CM | POA: Diagnosis not present

## 2020-05-11 DIAGNOSIS — Z853 Personal history of malignant neoplasm of breast: Secondary | ICD-10-CM | POA: Diagnosis not present

## 2020-05-11 DIAGNOSIS — I110 Hypertensive heart disease with heart failure: Secondary | ICD-10-CM | POA: Diagnosis not present

## 2020-05-11 DIAGNOSIS — L97822 Non-pressure chronic ulcer of other part of left lower leg with fat layer exposed: Secondary | ICD-10-CM | POA: Insufficient documentation

## 2020-05-11 DIAGNOSIS — I872 Venous insufficiency (chronic) (peripheral): Secondary | ICD-10-CM | POA: Insufficient documentation

## 2020-05-11 NOTE — Progress Notes (Addendum)
ARDEL, [name] ([id number]) Visit Report for 05/11/2020 Chief Complaint Document Details Patient Name: [name], [name] Date of Service: 05/11/2020 2:30 PM Medical Record Number: [id number] Patient Account Number: [id number] Date of Birth/Sex: 08/05/1946 (74 y.o. F) Treating RN: Grover Canavan Primary Care Provider: [name] Other Clinician: Referring Provider: [name] Treating Provider/Extender: Melburn Hake, Alric Geise Weeks in Treatment: 9 Information Obtained from: Patient Chief Complaint Left LE Ulcer Electronic Signature(s) Signed: 05/11/2020 2:57:03 PM By: Worthy Keeler PA-C Entered By: Worthy Keeler on 05/11/2020 14:57:03 [name] ([id number]) -------------------------------------------------------------------------------- HPI Details Patient Name: [name] Date of Service: 05/11/2020 2:30 PM Medical Record Number: [id number] Patient Account Number: [id number] Date of Birth/Sex: 03-02-1946 (74 y.o. F) Treating RN: Grover Canavan Primary Care Provider: [name] Other Clinician: Referring Provider: [name] Treating Provider/Extender: Melburn Hake, Lajuana Patchell Weeks in Treatment: 9 History of Present Illness HPI Description: 03/15/2020 upon evaluation today patient presents today for initial evaluation here in our clinic concerning a wound that is on the left posterior lower extremity. Unfortunately this has been giving the patient some discomfort at this point which she notes has been affected her pretty much daily. The patient does have a history of venous insufficiency, hypertension, congestive heart failure, and a history of breast cancer. Fortunately there does not appear to be evidence of active infection at this time which is great news. No fevers, chills, nausea, vomiting, or diarrhea. Wound is not extremely large but does have some slough covering the surface of the wound. This is going require sharp debridement today. 03/15/2020 upon evaluation today  patient appears to be doing fairly well in regard to her wound. She does have some slough noted on the surface of the wound currently but I do believe the Iodoflex has been beneficial over the past week. There is no signs of active infection at this time which is great news and overall very pleased with the progress. No fevers, chills, nausea, vomiting, or diarrhea. 04/02/2020 upon evaluation today patient appears to be doing a little better in regard to her wound size wise this is not tremendously smaller she does have some slough buildup on the surface of the wound. With that being said this is going require some sharp debridement today to clear away some of the necrotic debris. Fortunately there is no evidence of active infection at this time. No fevers, chills, nausea, vomiting, or diarrhea. 04/10/2020 on evaluation today patient appears to be doing well with regard to her ulcer which is measuring somewhat smaller today. She actually has more granulation tissue noted which is also good news is no need for sharp debridement today. Fortunately there is no evidence of infection either which is also excellent. No fevers, chills, nausea, vomiting, or diarrhea. 04/26/2020 on evaluation today patient's wound actually is showing signs of good improvement which is great news. There does not appear to be any evidence of active infection. I do believe she is tolerating the collagen at this point. 05/03/2020 upon evaluation today patient's wound actually showed signs of good granulation at this time there does not appear to be any evidence of active infection which is great news and overall very pleased with where things stand. With that being said I do believe that the patient is can require some sharp debridement today but fortunately nothing too significant. 05/11/20 on evaluation today patient appears to be doing well in regard to her leg ulcer. She's been tolerating the dressing changes without complication.  Fortunately there is no signs of active infection at  this time. Overall I'm very pleased with where things stand. Electronic Signature(s) Signed: 05/12/2020 10:53:30 AM By: Worthy Keeler PA-C Entered By: Worthy Keeler on 05/12/2020 10:53:29 [name], [name] ([id number]) -------------------------------------------------------------------------------- Physical Exam Details Patient Name: [name] Date of Service: 05/11/2020 2:30 PM Medical Record Number: [id number] Patient Account Number: [id number] Date of Birth/Sex: 09-26-1945 (74 y.o. F) Treating RN: Grover Canavan Primary Care Provider: [name] Other Clinician: Referring Provider: [name] Treating Provider/Extender: STONE III, Lieutenant Abarca Weeks in Treatment: 9 Constitutional Obese and well-hydrated in no acute distress. Respiratory normal breathing without difficulty. Psychiatric this patient is able to make decisions and demonstrates good insight into disease process. Alert and Oriented x 3. pleasant and cooperative. Notes Upon inspection patient's wound that action showed signs of good granulation with minimal slough buildup of Zabel to mechanically debride this away no sharp debridement was necessary today. Electronic Signature(s) Signed: 05/12/2020 10:54:32 AM By: Worthy Keeler PA-C Entered By: Worthy Keeler on 05/12/2020 10:54:31 [name] ([id number]) -------------------------------------------------------------------------------- Physician Orders Details Patient Name: [name] Date of Service: 05/11/2020 2:30 PM Medical Record Number: [id number] Patient Account Number: [id number] Date of Birth/Sex: 11/12/1945 (74 y.o. F) Treating RN: Grover Canavan Primary Care Provider: [name] Other Clinician: Referring Provider: [name] Treating Provider/Extender: Melburn Hake, Kaisyn Millea Weeks in Treatment: 9 Verbal / Phone Orders: No Diagnosis Coding ICD-10 Coding Code Description I87.2 Venous  insufficiency (chronic) (peripheral) L97.822 Non-pressure chronic ulcer of other part of left lower leg with fat layer exposed I10 Essential (primary) hypertension I50.42 Chronic combined systolic (congestive) and diastolic (congestive) heart failure C50.919 Malignant neoplasm of unspecified site of unspecified female breast Wound Cleansing o Cleanse wound with mild soap and water Anesthetic (add to Medication List) Wound #1 Left,Posterior Lower Leg o Topical Lidocaine 4% cream applied to wound bed prior to debridement (In Clinic Only). Primary Wound Dressing Wound #1 Left,Posterior Lower Leg o Silver Collagen Secondary Dressing Wound #1 Left,Posterior Lower Leg o ABD pad Dressing Change Frequency Wound #1 Left,Posterior Lower Leg o Other: - tuesday and friday (HH to change on Tuesdays) Follow-up Appointments Wound #1 Left,Posterior Lower Leg o Return Appointment in 1 week. Edema Control Wound #1 Left,Posterior Lower Leg o 3 Layer Compression System - Left Lower Extremity - TCA to leg to help with itching o Elevate legs to the level of the heart and pump ankles as often as possible Additional Orders / Instructions Wound #1 Left,Posterior Lower Leg o Vitamin A; Vitamin C, Zinc o Increase protein intake. Home Health Wound #1 [place] for [place] Nurse may visit PRN to address patientos wound care needs. o FACE TO FACE ENCOUNTER: MEDICARE and MEDICAID PATIENTS: I certify that this patient is under my care and that I had a face-to-face encounter that meets the physician face-to-face encounter requirements with this patient on this date. The encounter with the patient was in whole or in part for the following MEDICAL CONDITION: (primary reason for [place]) MEDICAL NECESSITY: I certify, that based on my findings, NURSING services are a medically necessary home [place], LEANN  ([id number]) health service. HOME BOUND STATUS: I certify that my clinical findings support that this patient is homebound (i.e., Due to illness or injury, pt requires aid of supportive devices such as crutches, cane, wheelchairs, walkers, the use of special transportation or the assistance of another person to leave their place of residence. There is a normal inability to leave the home and doing so requires considerable  and taxing effort. Other absences are for medical reasons / religious services and are infrequent or of short duration when for other reasons). o If current dressing causes regression in wound condition, may D/C ordered dressing product/s and apply Normal Saline Moist Dressing daily until next [place] / Other MD appointment. [place] of regression in wound condition at [phone]. o Please direct any NON-WOUND related issues/requests for orders to patient's Primary Care Physician Electronic Signature(s) Signed: 05/11/2020 5:11:36 PM By: Grover Canavan Signed: 05/12/2020 11:00:59 AM By: Worthy Keeler PA-C Entered By: Grover Canavan on 05/11/2020 16:03:23 CAROLL, WEINHEIMER ([id number]) -------------------------------------------------------------------------------- Problem List Details Patient Name: [name] Date of Service: 05/11/2020 2:30 PM Medical Record Number: [id number] Patient Account Number: [id number] Date of Birth/Sex: 26-Sep-1945 (74 y.o. F) Treating RN: Grover Canavan Primary Care Provider: [name] Other Clinician: Referring Provider: [name] Treating Provider/Extender: Melburn Hake, Tylea Hise Weeks in Treatment: 9 Active Problems ICD-10 Encounter Code Description Active Date MDM Diagnosis I87.2 Venous insufficiency (chronic) (peripheral) 03/06/2020 No Yes L97.822 Non-pressure chronic ulcer of other part of left lower leg with fat layer 03/06/2020 No Yes exposed [place] (primary) hypertension 03/06/2020 No  Yes I50.42 Chronic combined systolic (congestive) and diastolic (congestive) heart 03/06/2020 No Yes failure C50.919 Malignant neoplasm of unspecified site of unspecified female breast 03/06/2020 No Yes Inactive Problems Resolved Problems Electronic Signature(s) Signed: 05/11/2020 2:56:57 PM By: Worthy Keeler PA-C Entered By: Worthy Keeler on 05/11/2020 14:56:57 [name] ([id number]) -------------------------------------------------------------------------------- Progress Note Details Patient Name: [name] Date of Service: 05/11/2020 2:30 PM Medical Record Number: [id number] Patient Account Number: [id number] Date of Birth/Sex: 1946/02/23 (74 y.o. F) Treating RN: Grover Canavan Primary Care Provider: [name] Other Clinician: Referring Provider: [name] Treating Provider/Extender: Melburn Hake, Sherrye Puga Weeks in Treatment: 9 Subjective Chief Complaint Information obtained from Patient Left LE Ulcer History of Present Illness (HPI) 03/15/2020 upon evaluation today patient presents today for initial evaluation here in our clinic concerning a wound that is on the left posterior lower extremity. Unfortunately this has been giving the patient some discomfort at this point which she notes has been affected her pretty much daily. The patient does have a history of venous insufficiency, hypertension, congestive heart failure, and a history of breast cancer. Fortunately there does not appear to be evidence of active infection at this time which is great news. No fevers, chills, nausea, vomiting, or diarrhea. Wound is not extremely large but does have some slough covering the surface of the wound. This is going require sharp debridement today. 03/15/2020 upon evaluation today patient appears to be doing fairly well in regard to her wound. She does have some slough noted on the surface of the wound currently but I do believe the Iodoflex has been beneficial over the past week.  There is no signs of active infection at this time which is great news and overall very pleased with the progress. No fevers, chills, nausea, vomiting, or diarrhea. 04/02/2020 upon evaluation today patient appears to be doing a little better in regard to her wound size wise this is not tremendously smaller she does have some slough buildup on the surface of the wound. With that being said this is going require some sharp debridement today to clear away some of the necrotic debris. Fortunately there is no evidence of active infection at this time. No fevers, chills, nausea, vomiting, or diarrhea. 04/10/2020 on evaluation today patient appears to be doing well with regard to her ulcer which is measuring  somewhat smaller today. She actually has more granulation tissue noted which is also good news is no need for sharp debridement today. Fortunately there is no evidence of infection either which is also excellent. No fevers, chills, nausea, vomiting, or diarrhea. 04/26/2020 on evaluation today patient's wound actually is showing signs of good improvement which is great news. There does not appear to be any evidence of active infection. I do believe she is tolerating the collagen at this point. 05/03/2020 upon evaluation today patient's wound actually showed signs of good granulation at this time there does not appear to be any evidence of active infection which is great news and overall very pleased with where things stand. With that being said I do believe that the patient is can require some sharp debridement today but fortunately nothing too significant. 05/11/20 on evaluation today patient appears to be doing well in regard to her leg ulcer. She's been tolerating the dressing changes without complication. Fortunately there is no signs of active infection at this time. Overall I'm very pleased with where things stand. Objective Constitutional Obese and well-hydrated in no acute distress. Vitals Time Taken:  3:05 PM, Height: 69 in, Weight: 263 lbs, BMI: 38.8, Temperature: 98.3 F, Pulse: 66 bpm, Respiratory Rate: 18 breaths/min, Blood Pressure: 142/50 mmHg. Respiratory normal breathing without difficulty. Psychiatric this patient is able to make decisions and demonstrates good insight into disease process. Alert and Oriented x 3. pleasant and cooperative. General Notes: Upon inspection patient's wound that action showed signs of good granulation with minimal slough buildup of Zabel to mechanically debride this away no sharp debridement was necessary today. Integumentary (Hair, Skin) Wound #1 status is Open. Original cause of wound was Gradually Appeared. The wound is located on the Left,Posterior Lower Leg. The wound measures 1cm length x 0.9cm width x 0.1cm depth; 0.707cm^2 area and 0.071cm^3 volume. There is Fat Layer (Subcutaneous Tissue) exposed. [place], Michelyn ([id number]) There is a large amount of serous drainage noted. The wound margin is flat and intact. There is medium (34-66%) red granulation within the wound bed. There is a medium (34-66%) amount of necrotic tissue within the wound bed including Adherent Slough. Assessment Active Problems ICD-10 Venous insufficiency (chronic) (peripheral) Non-pressure chronic ulcer of other part of left lower leg with fat layer exposed Essential (primary) hypertension Chronic combined systolic (congestive) and diastolic (congestive) heart failure Malignant neoplasm of unspecified site of unspecified female breast Procedures Wound #1 Pre-procedure diagnosis of Wound #1 is a Venous Leg Ulcer located on the Left,Posterior Lower Leg . There was a Three Layer Compression Therapy Procedure by Grover Canavan, RN. Post procedure Diagnosis Wound #1: Same as Pre-Procedure Plan Wound Cleansing: Cleanse wound with mild soap and water Anesthetic (add to Medication List): Wound #1 Left,Posterior Lower Leg: Topical Lidocaine 4% cream applied to wound  bed prior to debridement (In Clinic Only). Primary Wound Dressing: Wound #1 Left,Posterior Lower Leg: Silver Collagen Secondary Dressing: Wound #1 Left,Posterior Lower Leg: ABD pad Dressing Change Frequency: Wound #1 Left,Posterior Lower Leg: Other: - tuesday and friday (HH to change on Tuesdays) Follow-up Appointments: Wound #1 Left,Posterior Lower Leg: Return Appointment in 1 week. Edema Control: Wound #1 Left,Posterior Lower Leg: 3 Layer Compression System - Left Lower Extremity - TCA to leg to help with itching Elevate legs to the level of the heart and pump ankles as often as possible Additional Orders / Instructions: Wound #1 Left,Posterior Lower Leg: Vitamin A; Vitamin C, Zinc Increase protein intake. Home Health: Wound #1 Left,Posterior Lower Leg: Initiate  Home Health for [place] Nurse may visit PRN to address patient s wound care needs. FACE TO FACE ENCOUNTER: MEDICARE and MEDICAID PATIENTS: I certify that this patient is under my care and that I had a face-to-face encounter that meets the physician face-to-face encounter requirements with this patient on this date. The encounter with the patient was in whole or in part for the following MEDICAL CONDITION: (primary reason for [place]) MEDICAL NECESSITY: I certify, that based on my findings, NURSING services are a medically necessary home health service. HOME BOUND STATUS: I certify that my clinical findings support that this patient is homebound (i.e., Due to illness or injury, pt requires aid of supportive devices such as crutches, cane, wheelchairs, walkers, the use of special transportation or the assistance of another person to leave their place of residence. There is a normal inability to leave the home and doing so requires considerable and taxing effort. Other absences are for medical reasons / religious services and are infrequent or of short duration when for other reasons). If current  dressing causes regression in wound condition, may D/C ordered dressing product/s and apply Normal Saline Moist Dressing daily until MELANI, BRISBANE ([id number]) next [place] / Other MD appointment. [place] of regression in wound condition at [phone]. Please direct any NON-WOUND related issues/requests for orders to patient's Primary Care Physician 1. Him and I suggest that we continueCurrently with a silver collagen I think best the best way to go. 2. I'm also the recommended AVD pad over top of this followed by a 3 layer compression wrap to the left leg to keep edema under control. 3. Home health will continue to come out to change her dressings as well on a semi-regular basis. That is if we can obtain a home help for her. I think that would be helpful but will have to give this a shot. Next We will see the patient for follow-up visit in one week at this point. If we're able to get home health may extend this out we will see how things go. Electronic Signature(s) Signed: 05/12/2020 10:56:12 AM By: Worthy Keeler PA-C Entered By: Worthy Keeler on 05/12/2020 10:56:12 [name] ([id number]) -------------------------------------------------------------------------------- SuperBill Details Patient Name: [name] Date of Service: 05/11/2020 Medical Record Number: [id number] Patient Account Number: [id number] Date of Birth/Sex: Apr 09, 1946 (74 y.o. F) Treating RN: Grover Canavan Primary Care Provider: [name] Other Clinician: Referring Provider: [name] Treating Provider/Extender: Melburn Hake, Orhan Mayorga Weeks in Treatment: 9 Diagnosis Coding ICD-10 Codes Code Description I87.2 Venous insufficiency (chronic) (peripheral) L97.822 Non-pressure chronic ulcer of other part of left lower leg with fat layer exposed I10 Essential (primary) hypertension I50.42 Chronic combined systolic (congestive) and diastolic (congestive) heart  failure C50.919 Malignant neoplasm of unspecified site of unspecified female breast Facility Procedures CPT4 Code: [id number] Description: (Facility Use Only) [phone] - [place] LWR LT LEG Modifier: Quantity: 1 Physician Procedures CPT4 Code: [id number] Description: 87564 - WC PHYS LEVEL 3 - EST PT Modifier: Quantity: 1 CPT4 Code: Description: ICD-10 Diagnosis Description I87.2 Venous insufficiency (chronic) (peripheral) L97.822 Non-pressure chronic ulcer of other part of left lower leg with fat lay I10 Essential (primary) hypertension I50.42 Chronic combined systolic (congestive)  and diastolic (congestive) heart Modifier: er exposed failure Quantity: Electronic Signature(s) Signed: 05/12/2020 10:57:30 AM By: Worthy Keeler PA-C Previous Signature: 05/11/2020 5:11:36 PM Version By: Grover Canavan Entered By: Worthy Keeler on 05/12/2020 10:57:29

## 2020-05-11 NOTE — Progress Notes (Addendum)
DREAMA, KUNA ([id number]) Visit Report for 05/11/2020 Arrival Information Details Patient Name: [name], [name] Date of Service: 05/11/2020 2:30 PM Medical Record Number: [id number] Patient Account Number: [id number] Date of Birth/Sex: February 17, 1946 (75 y.o. F) Treating RN: Grover Canavan Primary Care Agnes Brightbill: [name] Other Clinician: Referring Croix Presley: [name] Treating Braxtin Bamba/Extender: Melburn Hake, HOYT Weeks in Treatment: 9 Visit Information History Since Last Visit Added or deleted any medications: No Patient Arrived: Wheel Chair Any new allergies or adverse reactions: No Arrival Time: 15:08 Had a fall or experienced change in No Accompanied By: self activities of daily living that may affect Transfer Assistance: None risk of falls: Patient Identification Verified: Yes Signs or symptoms of abuse/neglect since last visito No Secondary Verification Process Completed: Yes Hospitalized since last visit: No Patient Requires Transmission-Based Precautions: No Implantable device outside of the clinic excluding No Patient Has Alerts: No cellular tissue based products placed in the center since last visit: Has Dressing in Place as Prescribed: Yes Has Compression in Place as Prescribed: Yes Pain Present Now: Yes Electronic Signature(s) Signed: 05/11/2020 4:18:21 PM By: Lorine Bears RCP, RRT, CHT Entered By: Lorine Bears on 05/11/2020 15:09:05 [name] ([id number]) -------------------------------------------------------------------------------- Compression Therapy Details Patient Name: [name] Date of Service: 05/11/2020 2:30 PM Medical Record Number: [id number] Patient Account Number: [id number] Date of Birth/Sex: Feb 01, 1946 (74 y.o. F) Treating RN: Grover Canavan Primary Care Lilie Vezina: [name] Other Clinician: Referring Nhi Butrum: [name] Treating Avanni Turnbaugh/Extender: Melburn Hake, HOYT Weeks in Treatment:  9 Compression Therapy Performed for Wound Assessment: Wound #1 Left,Posterior Lower Leg Performed By: Clinician Grover Canavan, RN Compression Type: Three Layer Post Procedure Diagnosis Same as Pre-procedure Electronic Signature(s) Signed: 05/11/2020 5:11:36 PM By: Grover Canavan Entered By: Grover Canavan on 05/11/2020 15:38:57 [name], [name] ([id number]) -------------------------------------------------------------------------------- Encounter Discharge Information Details Patient Name: [name] Date of Service: 05/11/2020 2:30 PM Medical Record Number: [id number] Patient Account Number: [id number] Date of Birth/Sex: December 21, 1945 (74 y.o. F) Treating RN: Grover Canavan Primary Care Kimmarie Pascale: [name] Other Clinician: Referring Cavion Faiola: [name] Treating Tulip Meharg/Extender: Melburn Hake, HOYT Weeks in Treatment: 9 Encounter Discharge Information Items Discharge Condition: Stable Ambulatory Status: Wheelchair Discharge Destination: Home Transportation: Private Auto Accompanied By: daughter Schedule Follow-up Appointment: Yes Clinical Summary of Care: Electronic Signature(s) Signed: 05/11/2020 5:11:36 PM By: Grover Canavan Entered By: Grover Canavan on 05/11/2020 15:44:03 [name] ([id number]) -------------------------------------------------------------------------------- Lower Extremity Assessment Details Patient Name: [name] Date of Service: 05/11/2020 2:30 PM Medical Record Number: [id number] Patient Account Number: [id number] Date of Birth/Sex: 05-Feb-1946 (74 y.o. F) Treating RN: Grover Canavan Primary Care Hildagard Sobecki: [name] Other Clinician: Referring Konnie Noffsinger: [name] Treating Kieanna Rollo/Extender: STONE III, HOYT Weeks in Treatment: 9 Edema Assessment Assessed: [Left: Yes] [Right: No] Edema: [Left: Ye] [Right: s] Calf Left: Right: Point of Measurement: 32 cm From Medial Instep 39.5 cm cm Ankle Left: Right: Point of  Measurement: 12 cm From Medial Instep 26 cm cm Vascular Assessment Pulses: Dorsalis Pedis Palpable: [Left:Yes] Posterior Tibial Palpable: [Left:Yes] Electronic Signature(s) Signed: 05/11/2020 5:11:36 PM By: Grover Canavan Signed: 05/11/2020 5:24:12 PM By: Darci Needle Entered By: Darci Needle on 05/11/2020 15:20:26 [name] ([id number]) -------------------------------------------------------------------------------- Multi Wound Chart Details Patient Name: [name] Date of Service: 05/11/2020 2:30 PM Medical Record Number: [id number] Patient Account Number: [id number] Date of Birth/Sex: [birth year] (74 y.o. F) Treating RN: Grover Canavan Primary Care Dontarious Schaum: [name] Other Clinician: Referring Hodari Chuba: [name] Treating Payal Stanforth/Extender: STONE III, HOYT Weeks in Treatment: 9 Vital Signs Height(in): 69 Pulse(bpm): 66 Weight(lbs): 597 Blood Pressure(mmHg): 142/50 Body  Mass Index(BMI): 39 Temperature(F): 98.3 Respiratory Rate(breaths/min): 18 Photos: [N/A:N/A] Wound Location: Left, Posterior Lower Leg N/A N/A Wounding Event: Gradually Appeared N/A N/A Primary Etiology: Venous Leg Ulcer N/A N/A Comorbid History: Cataracts, Congestive Heart Failure, N/A N/A Hypertension, Type II Diabetes, Osteoarthritis, Neuropathy, Received Chemotherapy, Received Radiation Date Acquired: 03/06/2020 N/A N/A Weeks of Treatment: 9 N/A N/A Wound Status: Open N/A N/A Measurements L x W x D (cm) 1x0.9x0.1 N/A N/A Area (cm) : 0.707 N/A N/A Volume (cm) : 0.071 N/A N/A % Reduction in Area: 42.30% N/A N/A % Reduction in Volume: 42.30% N/A N/A Classification: Full Thickness Without Exposed N/A N/A Support Structures Exudate Amount: Large N/A N/A Exudate Type: Serous N/A N/A Exudate Color: amber N/A N/A Wound Margin: Flat and Intact N/A N/A Granulation Amount: Medium (34-66%) N/A N/A Granulation Quality: Red N/A N/A Necrotic Amount: Medium (34-66%) N/A  N/A Exposed Structures: Fat Layer (Subcutaneous Tissue): N/A N/A Yes Fascia: No Tendon: No Muscle: No Joint: No Bone: No Epithelialization: Small (1-33%) N/A N/A Treatment Notes Electronic Signature(s) Signed: 05/11/2020 5:11:36 PM By: Grover Canavan Entered By: Grover Canavan on 05/11/2020 15:37:27 [name], [name] ([id number]) [name], [name] ([id number]) -------------------------------------------------------------------------------- Multi-Disciplinary Care Plan Details Patient Name: [name] Date of Service: 05/11/2020 2:30 PM Medical Record Number: [id number] Patient Account Number: [id number] Date of Birth/Sex: 1946-07-07 (74 y.o. F) Treating RN: Grover Canavan Primary Care Tamarion Haymond: [name] Other Clinician: Referring Lael Wetherbee: [name] Treating Seema Blum/Extender: Melburn Hake, HOYT Weeks in Treatment: 9 Active Inactive Necrotic Tissue Nursing Diagnoses: Impaired tissue integrity related to necrotic/devitalized tissue Knowledge deficit related to management of necrotic/devitalized tissue Goals: Necrotic/devitalized tissue will be minimized in the wound bed Date Initiated: 03/06/2020 Target Resolution Date: 05/11/2020 Goal Status: Active Patient/caregiver will verbalize understanding of reason and process for debridement of necrotic tissue Date Initiated: 03/06/2020 Date Inactivated: 04/10/2020 Target Resolution Date: 03/06/2020 Goal Status: Met Interventions: Assess patient pain level pre-, during and post procedure and prior to discharge Provide education on necrotic tissue and debridement process Treatment Activities: Apply topical anesthetic as ordered : 03/06/2020 Notes: Pain, Acute or Chronic Nursing Diagnoses: Pain Management - Non-cyclic Acute (Procedural) Pain, acute or chronic: actual or potential Goals: Patient will verbalize adequate pain control and receive pain control interventions during procedures as needed Date Initiated:  03/06/2020 Target Resolution Date: 05/11/2020 Goal Status: Active Interventions: Reposition patient for comfort Notes: Venous Leg Ulcer Nursing Diagnoses: Potential for venous Insuffiency (use before diagnosis confirmed) Goals: Patient will maintain optimal edema control Date Initiated: 03/06/2020 Target Resolution Date: 05/11/2020 Goal Status: Active Interventions: Assess peripheral edema status every visit. Compression as ordered Treatment Activities: [name], [name] ([id number]) Therapeutic compression applied : 03/06/2020 Notes: Electronic Signature(s) Signed: 05/11/2020 5:11:36 PM By: Grover Canavan Entered By: Grover Canavan on 05/11/2020 15:37:20 [name], [name] ([id number]) -------------------------------------------------------------------------------- Pain Assessment Details Patient Name: [name] Date of Service: 05/11/2020 2:30 PM Medical Record Number: [id number] Patient Account Number: [id number] Date of Birth/Sex: 1945-11-21 (74 y.o. F) Treating RN: Grover Canavan Primary Care Mayuri Staples: [name] Other Clinician: Referring Chelsa Stout: [name] Treating Ad Guttman/Extender: Melburn Hake, HOYT Weeks in Treatment: 9 Active Problems Location of Pain Severity and Description of Pain Patient Has Paino No Site Locations With Dressing Change: No Pain Management and Medication Current Pain Management: Electronic Signature(s) Signed: 05/11/2020 5:11:36 PM By: Grover Canavan Signed: 05/11/2020 5:24:12 PM By: Darci Needle Entered By: Darci Needle on 05/11/2020 15:20:15 [name] ([id number]) -------------------------------------------------------------------------------- Patient/Caregiver Education Details Patient Name: [name] Date of Service: 05/11/2020 2:30 PM Medical Record Number: [id number] Patient Account Number:  [id number] Date of Birth/Gender: July 18, 1946 (74 y.o. F) Treating RN: Grover Canavan Primary Care Physician: [name] Other Clinician: Referring Physician: [name] Treating Physician/Extender: Sharalyn Ink in Treatment: 9 Education Assessment Education Provided To: Patient Education Topics Provided Wound/Skin Impairment: Handouts: Caring for Your Ulcer Methods: Explain/Verbal Responses: State content correctly Electronic Signature(s) Signed: 05/11/2020 5:11:36 PM By: Grover Canavan Entered By: Grover Canavan on 05/11/2020 15:43:12 [name] ([id number]) -------------------------------------------------------------------------------- Wound Assessment Details Patient Name: [name] Date of Service: 05/11/2020 2:30 PM Medical Record Number: [id number] Patient Account Number: [id number] Date of Birth/Sex: 1946-04-14 (74 y.o. F) Treating RN: Grover Canavan Primary Care Davante Gerke: [name] Other Clinician: Referring Lucee Brissett: [name] Treating Emilija Bohman/Extender: Melburn Hake, HOYT Weeks in Treatment: 9 Wound Status Wound Number: 1 Primary Venous Leg Ulcer Etiology: Wound Location: Left, Posterior Lower Leg Wound Open Wounding Event: Gradually Appeared Status: Date Acquired: 03/06/2020 Comorbid Cataracts, Congestive Heart Failure, Hypertension, Type II Weeks Of Treatment: 9 History: Diabetes, Osteoarthritis, Neuropathy, Received Clustered Wound: No Chemotherapy, Received Radiation Photos Wound Measurements Length: (cm) 1 Width: (cm) 0.9 Depth: (cm) 0.1 Area: (cm) 0.707 Volume: (cm) 0.071 % Reduction in Area: 42.3% % Reduction in Volume: 42.3% Epithelialization: Small (1-33%) Wound Description Classification: Full Thickness Without Exposed Support Structu Wound Margin: Flat and Intact Exudate Amount: Large Exudate Type: Serous Exudate Color: amber res Foul Odor After Cleansing: No Slough/Fibrino Yes Wound Bed Granulation Amount: Medium (34-66%) Exposed Structure Granulation Quality: Red Fascia Exposed: No Necrotic Amount: Medium  (34-66%) Fat Layer (Subcutaneous Tissue) Exposed: Yes Necrotic Quality: Adherent Slough Tendon Exposed: No Muscle Exposed: No Joint Exposed: No Bone Exposed: No Treatment Notes Wound #1 (Left, Posterior Lower Leg) Notes Prisma, abd, 3 layer left with unna to anchor Electronic Signature(s) [name], [name] ([id number]) Signed: 05/11/2020 5:11:36 PM By: Grover Canavan Signed: 05/11/2020 5:24:12 PM By: Darci Needle Entered By: Darci Needle on 05/11/2020 15:19:21 [name] ([id number]) -------------------------------------------------------------------------------- [name] Details Patient Name: [name] Date of Service: 05/11/2020 2:30 PM Medical Record Number: [id number] Patient Account Number: [id number] Date of Birth/Sex: [birth year] (74 y.o. F) Treating RN: Grover Canavan Primary Care Cinda Hara: [name] Other Clinician: Referring Mio Schellinger: [name] Treating Kaeson Kleinert/Extender: STONE III, HOYT Weeks in Treatment: 9 Vital Signs Time Taken: 15:05 Temperature (F): 98.3 Height (in): 69 Pulse (bpm): 66 Weight (lbs): 263 Respiratory Rate (breaths/min): 18 Body Mass Index (BMI): 38.8 Blood Pressure (mmHg): 142/50 Reference Range: 80 - 120 mg / dl Electronic Signature(s) Signed: 05/11/2020 4:18:21 PM By: Lorine Bears RCP, RRT, CHT Entered By: Lorine Bears on 05/11/2020 15:09:45

## 2020-05-17 ENCOUNTER — Other Ambulatory Visit: Payer: Self-pay | Admitting: Oncology

## 2020-05-18 ENCOUNTER — Other Ambulatory Visit: Payer: Self-pay

## 2020-05-18 ENCOUNTER — Encounter: Payer: Medicare HMO | Admitting: Physician Assistant

## 2020-05-18 DIAGNOSIS — I872 Venous insufficiency (chronic) (peripheral): Secondary | ICD-10-CM | POA: Diagnosis not present

## 2020-05-18 NOTE — Progress Notes (Addendum)
FRENCHIE, PRIBYL ([id number]) Visit Report for 05/18/2020 Chief Complaint Document Details Patient Name: [name], [name] Date of Service: 05/18/2020 10:30 AM Medical Record Number: [id number] Patient Account Number: [id number] Date of Birth/Sex: 1946/06/23 (74 y.o. F) Treating RN: Cornell Barman Primary Care Provider: [name] Other Clinician: Referring Provider: [name] Treating Provider/Extender: Melburn Hake, Jaskirat Schwieger Weeks in Treatment: 10 Information Obtained from: Patient Chief Complaint Left LE Ulcer Electronic Signature(s) Signed: 05/18/2020 11:05:22 AM By: Worthy Keeler PA-C Entered By: Worthy Keeler on 05/18/2020 11:05:22 [name] ([id number]) -------------------------------------------------------------------------------- Debridement Details Patient Name: [name] Date of Service: 05/18/2020 10:30 AM Medical Record Number: [id number] Patient Account Number: [id number] Date of Birth/Sex: [birth year] (74 y.o. F) Treating RN: Grover Canavan Primary Care Provider: [name] Other Clinician: Referring Provider: [name] Treating Provider/Extender: Melburn Hake, Lamaj Metoyer Weeks in Treatment: 10 Debridement Performed for Wound #1 Left,Posterior Lower Leg Assessment: Performed By: Physician STONE III, March Steyer E., PA-C Debridement Type: Debridement Severity of Tissue Pre Debridement: Fat layer exposed Level of Consciousness (Pre- Awake and Alert procedure): Pre-procedure Verification/Time Out Yes - 11:29 Taken: Start Time: 11:30 Pain Control: Lidocaine Total Area Debrided (L x W): 0.5 (cm) x 0.5 (cm) = 0.25 (cm) Tissue and other material Viable, Non-Viable, Slough, Subcutaneous, Slough debrided: Level: Skin/Subcutaneous Tissue Debridement Description: Excisional Instrument: Curette Bleeding: Minimum Hemostasis Achieved: Pressure End Time: 11:31 Procedural Pain: 0 Post Procedural Pain: 0 Response to Treatment: Procedure was tolerated well Level of  Consciousness (Post- Awake and Alert procedure): Post Debridement Measurements of Total Wound Length: (cm) 0.5 Width: (cm) 0.5 Depth: (cm) 0.2 Volume: (cm) 0.039 Character of Wound/Ulcer Post Debridement: Improved Severity of Tissue Post Debridement: Fat layer exposed Post Procedure Diagnosis Same as Pre-procedure Electronic Signature(s) Signed: 05/18/2020 3:44:42 PM By: Grover Canavan Signed: 05/18/2020 4:38:18 PM By: Worthy Keeler PA-C Entered By: Grover Canavan on 05/18/2020 11:32:55 [name] ([id number]) -------------------------------------------------------------------------------- HPI Details Patient Name: [name] Date of Service: 05/18/2020 10:30 AM Medical Record Number: [id number] Patient Account Number: [id number] Date of Birth/Sex: [birth year] (74 y.o. F) Treating RN: Cornell Barman Primary Care Provider: [name] Other Clinician: Referring Provider: [name] Treating Provider/Extender: Melburn Hake, Ryota Treece Weeks in Treatment: 10 History of Present Illness HPI Description: 03/15/2020 upon evaluation today patient presents today for initial evaluation here in our clinic concerning a wound that is on the left posterior lower extremity. Unfortunately this has been giving the patient some discomfort at this point which she notes has been affected her pretty much daily. The patient does have a history of venous insufficiency, hypertension, congestive heart failure, and a history of breast cancer. Fortunately there does not appear to be evidence of active infection at this time which is great news. No fevers, chills, nausea, vomiting, or diarrhea. Wound is not extremely large but does have some slough covering the surface of the wound. This is going require sharp debridement today. 03/15/2020 upon evaluation today patient appears to be doing fairly well in regard to her wound. She does have some slough noted on the surface of the wound currently but I do  believe the Iodoflex has been beneficial over the past week. There is no signs of active infection at this time which is great news and overall very pleased with the progress. No fevers, chills, nausea, vomiting, or diarrhea. 04/02/2020 upon evaluation today patient appears to be doing a little better in regard to her wound size wise this is not tremendously smaller she does have some slough buildup on the surface of the wound.  With that being said this is going require some sharp debridement today to clear away some of the necrotic debris. Fortunately there is no evidence of active infection at this time. No fevers, chills, nausea, vomiting, or diarrhea. 04/10/2020 on evaluation today patient appears to be doing well with regard to her ulcer which is measuring somewhat smaller today. She actually has more granulation tissue noted which is also good news is no need for sharp debridement today. Fortunately there is no evidence of infection either which is also excellent. No fevers, chills, nausea, vomiting, or diarrhea. 04/26/2020 on evaluation today patient's wound actually is showing signs of good improvement which is great news. There does not appear to be any evidence of active infection. I do believe she is tolerating the collagen at this point. 05/03/2020 upon evaluation today patient's wound actually showed signs of good granulation at this time there does not appear to be any evidence of active infection which is great news and overall very pleased with where things stand. With that being said I do believe that the patient is can require some sharp debridement today but fortunately nothing too significant. 05/11/20 on evaluation today patient appears to be doing well in regard to her leg ulcer. She's been tolerating the dressing changes without complication. Fortunately there is no signs of active infection at this time. Overall I'm very pleased with where things stand. 05/18/2020 upon evaluation today  patient's wound is showing signs of improvement is very dry and I think the collagen for the most part just dried to the wound bed. I am going to clean this away with sharp debridement in order to get this under control in my opinion. Electronic Signature(s) Signed: 05/18/2020 2:44:46 PM By: Worthy Keeler PA-C Entered By: Worthy Keeler on 05/18/2020 14:44:46 [name] ([id number]) -------------------------------------------------------------------------------- Physical Exam Details Patient Name: [name] Date of Service: 05/18/2020 10:30 AM Medical Record Number: [id number] Patient Account Number: [id number] Date of Birth/Sex: 1946/08/18 (74 y.o. F) Treating RN: Cornell Barman Primary Care Provider: [name] Other Clinician: Referring Provider: [name] Treating Provider/Extender: STONE III, Clayton Jarmon Weeks in Treatment: 10 Constitutional Well-nourished and well-hydrated in no acute distress. Respiratory normal breathing without difficulty. Psychiatric this patient is able to make decisions and demonstrates good insight into disease process. Alert and Oriented x 3. pleasant and cooperative. Notes Patient's wound bed did require sharp debridement I clear away the dry dressing as well as necrotic material on the surface of the wound down to what appeared to be good subcutaneous tissue and the patient actually had a lot of new epithelial tissue as well. I really think tissues and a small amount of oil emulsion dressing followed by an alginate just to catch anything the drains would be the ideal thing to do at this point based on what I am seeing. Electronic Signature(s) Signed: 05/18/2020 2:45:12 PM By: Worthy Keeler PA-C Entered By: Worthy Keeler on 05/18/2020 14:45:12 [name] ([id number]) -------------------------------------------------------------------------------- Physician Orders Details Patient Name: [name] Date of Service: 05/18/2020 10:30  AM Medical Record Number: [id number] Patient Account Number: [id number] Date of Birth/Sex: [birth year] (74 y.o. 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 Primary Wound Dressing Wound #1 Left,Posterior Lower Leg o Silver Collagen o Other: - oil emulsion, then Scell Secondary Dressing Wound #1 Left,Posterior Lower Leg o ABD pad Dressing Change Frequency Wound #1 Left,Posterior Lower Leg o Change dressing every week Follow-up Appointments Wound #1 Left,Posterior Lower Leg o Return Appointment in 1 week. Edema Control Wound #1 Left,Posterior Lower Leg o 3 Layer Compression System - Left Lower Extremity - TCA to leg to help with itching o Elevate legs to the level of the heart and pump ankles as often as possible Additional Orders / Instructions Wound #1 Left,Posterior Lower Leg o Vitamin A; Vitamin C, Zinc o Increase protein intake. Electronic Signature(s) Signed: 05/18/2020 3:44:42 PM By: Grover Canavan Signed: 05/18/2020 4:38:18 PM By: Worthy Keeler PA-C Entered By: Grover Canavan on 05/18/2020 11:30:37 [name]  ([id number]) -------------------------------------------------------------------------------- Problem List Details Patient Name: [name] Date of Service: 05/18/2020 10:30 AM Medical Record Number: [id number] Patient Account Number: [id number] Date of Birth/Sex: 06-14-1946 (74 y.o. F) Treating RN: Cornell Barman Primary Care Provider: [name] Other Clinician: Referring Provider: [name] Treating Provider/Extender: Melburn Hake, Hobson Lax Weeks in Treatment: 10 Active Problems ICD-10 Encounter Code Description Active Date MDM Diagnosis I87.2 Venous insufficiency (chronic) (peripheral) 03/06/2020 No Yes L97.822 Non-pressure chronic ulcer of other part of left lower leg with fat layer 03/06/2020 No Yes exposed I10 Essential (primary) hypertension 03/06/2020 No Yes I50.42 Chronic combined systolic (congestive) and diastolic (congestive) heart 03/06/2020 No Yes failure C50.919 Malignant neoplasm of unspecified site of unspecified female breast 03/06/2020 No Yes Inactive Problems Resolved Problems Electronic Signature(s) Signed: 05/18/2020 11:05:15 AM By: Worthy Keeler PA-C Entered By: Worthy Keeler on 05/18/2020 11:05:15 [name] ([id number]) -------------------------------------------------------------------------------- Progress Note Details Patient Name: [name] Date of Service: 05/18/2020 10:30 AM Medical Record Number: [id number] Patient Account Number: [id number] Date of Birth/Sex: [birth year] (74 y.o. F) Treating RN: Cornell Barman Primary Care Provider: [name] Other Clinician: Referring Provider: [name] Treating Provider/Extender: Melburn Hake, Rajni Holsworth Weeks in Treatment: 10 Subjective Chief Complaint Information obtained from Patient Left LE Ulcer History of Present Illness (HPI) 03/15/2020 upon evaluation today patient presents today for initial evaluation here in our clinic concerning a wound that is on the left posterior lower extremity.  Unfortunately this has been giving the patient some discomfort at this point which she notes has been affected her pretty much daily. The patient does have a history of venous insufficiency, hypertension, congestive heart failure, and a history of breast cancer. Fortunately there does not appear to be evidence of active infection at this time which is great news. No fevers, chills, nausea, vomiting, or diarrhea. Wound is not extremely large but does have some slough covering the surface of the wound. This is going require sharp debridement today. 03/15/2020 upon evaluation today patient appears to be doing fairly well in regard to her wound. She does have some slough noted on the surface of the wound currently but I do believe the Iodoflex has been beneficial over the past week. There is no signs of active infection at this time which is great news and overall very pleased with the progress. No fevers, chills, nausea, vomiting, or diarrhea. 04/02/2020 upon evaluation today patient appears  to be doing a little better in regard to her wound size wise this is not tremendously smaller she does have some slough buildup on the surface of the wound. With that being said this is going require some sharp debridement today to clear away some of the necrotic debris. Fortunately there is no evidence of active infection at this time. No fevers, chills, nausea, vomiting, or diarrhea. 04/10/2020 on evaluation today patient appears to be doing well with regard to her ulcer which is measuring somewhat smaller today. She actually has more granulation tissue noted which is also good news is no need for sharp debridement today. Fortunately there is no evidence of infection either which is also excellent. No fevers, chills, nausea, vomiting, or diarrhea. 04/26/2020 on evaluation today patient's wound actually is showing signs of good improvement which is great news. There does not appear to be any evidence of active infection. I  do believe she is tolerating the collagen at this point. 05/03/2020 upon evaluation today patient's wound actually showed signs of good granulation at this time there does not appear to be any evidence of active infection which is great news and overall very pleased with where things stand. With that being said I do believe that the patient is can require some sharp debridement today but fortunately nothing too significant. 05/11/20 on evaluation today patient appears to be doing well in regard to her leg ulcer. She's been tolerating the dressing changes without complication. Fortunately there is no signs of active infection at this time. Overall I'm very pleased with where things stand. 05/18/2020 upon evaluation today patient's wound is showing signs of improvement is very dry and I think the collagen for the most part just dried to the wound bed. I am going to clean this away with sharp debridement in order to get this under control in my opinion. Objective Constitutional Well-nourished and well-hydrated in no acute distress. Vitals Time Taken: 11:05 AM, Height: 69 in, Weight: 263 lbs, BMI: 38.8, Temperature: 97.8 F, Pulse: 61 bpm, Respiratory Rate: 20 breaths/min, Blood Pressure: 132/66 mmHg. Respiratory normal breathing without difficulty. Psychiatric this patient is able to make decisions and demonstrates good insight into disease process. Alert and Oriented x 3. pleasant and cooperative. General Notes: Patient's wound bed did require sharp debridement I clear away the dry dressing as well as necrotic material on the surface of the wound down to what appeared to be good subcutaneous tissue and the patient actually had a lot of new epithelial tissue as well. I really think tissues and a small amount of oil emulsion dressing followed by an alginate just to catch anything the drains would be the ideal thing to do at this LUCILIA, YANNI ([id number]) point based on what I am  seeing. Integumentary (Hair, Skin) Wound #1 status is Open. Original cause of wound was Gradually Appeared. The wound is located on the Left,Posterior Lower Leg. The wound measures 0.5cm length x 0.5cm width x 0.1cm depth; 0.196cm^2 area and 0.02cm^3 volume. There is Fat Layer (Subcutaneous Tissue) exposed. There is a large amount of serous drainage noted. The wound margin is flat and intact. There is medium (34-66%) red granulation within the wound bed. There is a medium (34-66%) amount of necrotic tissue within the wound bed including Adherent Slough. Assessment Active Problems ICD-10 Venous insufficiency (chronic) (peripheral) Non-pressure chronic ulcer of other part of left lower leg with fat layer exposed Essential (primary) hypertension Chronic combined systolic (congestive) and diastolic (congestive) heart failure Malignant neoplasm of unspecified  site of unspecified female breast Procedures Wound #1 Pre-procedure diagnosis of Wound #1 is a Venous Leg Ulcer located on the Left,Posterior Lower Leg .Severity of Tissue Pre Debridement is: Fat layer exposed. There was a Excisional Skin/Subcutaneous Tissue Debridement with a total area of 0.25 sq cm performed by STONE III, Harish Bram E., PA-C. With the following instrument(s): Curette to remove Viable and Non-Viable tissue/material. Material removed includes Subcutaneous Tissue and Slough and after achieving pain control using Lidocaine. No specimens were taken. A time out was conducted at 11:29, prior to the start of the procedure. A Minimum amount of bleeding was controlled with Pressure. The procedure was tolerated well with a pain level of 0 throughout and a pain level of 0 following the procedure. Post Debridement Measurements: 0.5cm length x 0.5cm width x 0.2cm depth; 0.039cm^3 volume. Character of Wound/Ulcer Post Debridement is improved. Severity of Tissue Post Debridement is: Fat layer exposed. Post procedure Diagnosis Wound #1: Same as  Pre-Procedure Pre-procedure diagnosis of Wound #1 is a Venous Leg Ulcer located on the Left,Posterior Lower Leg . There was a Three Layer Compression Therapy Procedure by Grover Canavan, RN. Post procedure Diagnosis Wound #1: Same as Pre-Procedure Plan Wound Cleansing: Cleanse wound with mild soap and water Anesthetic (add to Medication List): Wound #1 Left,Posterior Lower Leg: Topical Lidocaine 4% cream applied to wound bed prior to debridement (In Clinic Only). Primary Wound Dressing: Wound #1 Left,Posterior Lower Leg: Silver Collagen Other: - oil emulsion, then Scell Secondary Dressing: Wound #1 Left,Posterior Lower Leg: ABD pad Dressing Change Frequency: Wound #1 Left,Posterior Lower Leg: Change dressing every week Follow-up Appointments: Wound #1 Left,Posterior Lower Leg: Return Appointment in 1 week. Edema Control: Wound #1 Left,Posterior Lower Leg: 3 Layer Compression System - Left Lower Extremity - TCA to leg to help with itching Elevate legs to the level of the heart and pump ankles as often as possible Additional Orders / Instructions: Wound #1 Left,Posterior Lower Leg: LONA, SIX ([id number]) Vitamin A; Vitamin C, Zinc Increase protein intake. 1. I would recommend currently that we go ahead and initiate treatment with a oil emulsion dressing followed by silver alginate over top. 2. We will also use an ABD pad and the 3 layer wrap to provide appropriate compression done well without at this point. 3. I would also recommend the patient continue to elevate her legs is much as possible try to keep edema under good control. We will see patient back for reevaluation in 1 week here in the clinic. If anything worsens or changes patient will contact our office for additional recommendations. Electronic Signature(s) Signed: 05/18/2020 2:45:46 PM By: Worthy Keeler PA-C Entered By: Worthy Keeler on 05/18/2020 14:45:45 [name]  ([id number]) -------------------------------------------------------------------------------- SuperBill Details Patient Name: [name] Date of Service: 05/18/2020 Medical Record Number: [id number] Patient Account Number: [id number] Date of Birth/Sex: [birth year] (74 y.o. F) Treating RN: Cornell Barman Primary Care Provider: [name] Other Clinician: Referring Provider: [name] Treating Provider/Extender: Melburn Hake, Cayley Pester Weeks in Treatment: 10 Diagnosis Coding ICD-10 Codes Code Description I87.2 Venous insufficiency (chronic) (peripheral) L97.822 Non-pressure chronic ulcer of other part of left lower leg with fat layer exposed I10 Essential (primary) hypertension I50.42 Chronic combined systolic (congestive) and diastolic (congestive) heart failure C50.919 Malignant neoplasm of unspecified site of unspecified female breast Facility Procedures CPT4 Code: 28413244 Description: 01027 - DEB SUBQ TISSUE 20 SQ CM/< Modifier: Quantity: 1 CPT4 Code: Description: ICD-10 Diagnosis Description L97.822 Non-pressure chronic ulcer of other part of left lower leg with fat layer  Modifier: exposed Quantity: Physician Procedures CPT4 Code: 2919166 Description: 06004 - WC PHYS SUBQ TISS 20 SQ CM Modifier: Quantity: 1 CPT4 Code: Description: ICD-10 Diagnosis Description L97.822 Non-pressure chronic ulcer of other part of left lower leg with fat layer Modifier: exposed Quantity: Electronic Signature(s) Signed: 05/18/2020 2:45:58 PM By: Worthy Keeler PA-C Entered By: Worthy Keeler on 05/18/2020 14:45:57

## 2020-05-22 NOTE — Progress Notes (Signed)
[name], [name] ([id number]) Visit Report for 05/18/2020 Arrival Information Details Patient Name: [name], [name] Date of Service: 05/18/2020 10:30 AM Medical Record Number: [id number] Patient Account Number: [id number] Date of Birth/Sex: 1945/10/16 (74 y.o. F) Treating RN: Cornell Barman Primary Care Kiyoshi Schaab: [name] Other Clinician: Referring Eldrige Pitkin: [name] Treating Arlana Canizales/Extender: Melburn Hake, HOYT Weeks in Treatment: 10 Visit Information History Since Last Visit Added or deleted any medications: No Patient Arrived: Wheel Chair Any new allergies or adverse reactions: No Arrival Time: 11:02 Had a fall or experienced change in No Accompanied By: self activities of daily living that may affect Transfer Assistance: Manual risk of falls: Patient Identification Verified: Yes Signs or symptoms of abuse/neglect since last visito No Secondary Verification Process Completed: Yes Hospitalized since last visit: No Patient Requires Transmission-Based Precautions: No Implantable device outside of the clinic excluding No Patient Has Alerts: No cellular tissue based products placed in the center since last visit: Has Dressing in Place as Prescribed: Yes Has Compression in Place as Prescribed: Yes Pain Present Now: Yes Electronic Signature(s) Signed: 05/18/2020 3:00:51 PM By: Lorine Bears RCP, RRT, CHT Entered By: Lorine Bears on 05/18/2020 11:03:29 [name] ([id number]) -------------------------------------------------------------------------------- Compression Therapy Details Patient Name: [name] Date of Service: 05/18/2020 10:30 AM Medical Record Number: [id number] Patient Account Number: [id number] Date of Birth/Sex: 1946-07-02 (74 y.o. F) Treating RN: Grover Canavan Primary Care Tericka Devincenzi: [name] Other Clinician: Referring Bergen Melle: [name] Treating Monique Hefty/Extender: Melburn Hake, HOYT Weeks in Treatment:  10 Compression Therapy Performed for Wound Assessment: Wound #1 Left,Posterior Lower Leg Performed By: Clinician Grover Canavan, RN Compression Type: Three Layer Post Procedure Diagnosis Same as Pre-procedure Electronic Signature(s) Signed: 05/18/2020 3:44:42 PM By: Grover Canavan Entered By: Grover Canavan on 05/18/2020 11:25:30 [name] ([id number]) -------------------------------------------------------------------------------- Encounter Discharge Information Details Patient Name: [name] Date of Service: 05/18/2020 10:30 AM Medical Record Number: [id number] Patient Account Number: [id number] Date of Birth/Sex: [birth year] (74 y.o. F) Treating RN: Grover Canavan Primary Care Windle Huebert: [name] Other Clinician: Referring Diyan Dave: [name] Treating Aspasia Rude/Extender: Melburn Hake, HOYT Weeks in Treatment: 10 Encounter Discharge Information Items Post Procedure Vitals Discharge Condition: Stable Temperature (F): 97.8 Ambulatory Status: Ambulatory Pulse (bpm): 61 Discharge Destination: Home Respiratory Rate (breaths/min): 20 Transportation: Other Blood Pressure (mmHg): 132/66 Accompanied By: self Schedule Follow-up Appointment: Yes Clinical Summary of Care: Electronic Signature(s) Signed: 05/18/2020 3:44:42 PM By: Grover Canavan Entered By: Grover Canavan on 05/18/2020 11:34:06 [name] ([id number]) -------------------------------------------------------------------------------- Lower Extremity Assessment Details Patient Name: [name] Date of Service: 05/18/2020 10:30 AM Medical Record Number: [id number] Patient Account Number: [id number] Date of Birth/Sex: 07/15/1946 (74 y.o. F) Treating RN: Cornell Barman Primary Care Marae Cottrell: [name] Other Clinician: Referring Bernece Gall: [name] Treating Taegen Delker/Extender: STONE III, HOYT Weeks in Treatment: 10 Edema Assessment Assessed: [Left: Yes] [Right: No] Edema: [Left: Ye]  [Right: s] Calf Left: Right: Point of Measurement: 32 cm From Medial Instep 38 cm cm Ankle Left: Right: Point of Measurement: 12 cm From Medial Instep 25.5 cm cm Vascular Assessment Pulses: Dorsalis Pedis Palpable: [Left:Yes] Posterior Tibial Palpable: [Left:Yes] Electronic Signature(s) Signed: 05/18/2020 11:55:23 AM By: Darci Needle Signed: 05/22/2020 4:28:55 PM By: Gretta Cool, BSN, RN, CWS, Kim RN, BSN Entered By: Darci Needle on 05/18/2020 11:18:10 [name] ([id number]) -------------------------------------------------------------------------------- Multi Wound Chart Details Patient Name: [name] Date of Service: 05/18/2020 10:30 AM Medical Record Number: [id number] Patient Account Number: [id number] Date of Birth/Sex: [birth year] (74 y.o. F) Treating RN: Grover Canavan Primary Care Fatimah Sundquist: [name] Other Clinician: Referring Rainy Rothman: [name]  Treating Mostafa Yuan/Extender: STONE III, HOYT Weeks in Treatment: 10 Vital Signs Height(in): 69 Pulse(bpm): 61 Weight(lbs): 263 Blood Pressure(mmHg): 132/66 Body Mass Index(BMI): 39 Temperature(F): 97.8 Respiratory Rate(breaths/min): 20 Photos: [N/A:N/A] Wound Location: Left, Posterior Lower Leg N/A N/A Wounding Event: Gradually Appeared N/A N/A Primary Etiology: Venous Leg Ulcer N/A N/A Comorbid History: Cataracts, Congestive Heart Failure, N/A N/A Hypertension, Type II Diabetes, Osteoarthritis, Neuropathy, Received Chemotherapy, Received Radiation Date Acquired: 03/06/2020 N/A N/A Weeks of Treatment: 10 N/A N/A Wound Status: Open N/A N/A Measurements L x W x D (cm) 0.5x0.5x0.1 N/A N/A Area (cm) : 0.196 N/A N/A Volume (cm) : 0.02 N/A N/A % Reduction in Area: 84.00% N/A N/A % Reduction in Volume: 83.70% N/A N/A Classification: Full Thickness Without Exposed N/A N/A Support Structures Exudate Amount: Large N/A N/A Exudate Type: Serous N/A N/A Exudate Color: amber N/A N/A Wound Margin:  Flat and Intact N/A N/A Granulation Amount: Medium (34-66%) N/A N/A Granulation Quality: Red N/A N/A Necrotic Amount: Medium (34-66%) N/A N/A Exposed Structures: Fat Layer (Subcutaneous Tissue): N/A N/A Yes Fascia: No Tendon: No Muscle: No Joint: No Bone: No Epithelialization: Small (1-33%) N/A N/A Treatment Notes Electronic Signature(s) Signed: 05/18/2020 3:44:42 PM By: Grover Canavan Entered By: Grover Canavan on 05/18/2020 11:23:40 [name] ([id number]) [name], [name] ([id number]) -------------------------------------------------------------------------------- Multi-Disciplinary Care Plan Details Patient Name: [name] Date of Service: 05/18/2020 10:30 AM Medical Record Number: [id number] Patient Account Number: [id number] Date of Birth/Sex: 16-Oct-1945 (74 y.o. F) Treating RN: Grover Canavan Primary Care Ikaika Showers: [name] Other Clinician: Referring Analayah Brooke: [name] Treating Lakeria Starkman/Extender: Melburn Hake, HOYT Weeks in Treatment: 10 Active Inactive Necrotic Tissue Nursing Diagnoses: Impaired tissue integrity related to necrotic/devitalized tissue Knowledge deficit related to management of necrotic/devitalized tissue Goals: Necrotic/devitalized tissue will be minimized in the wound bed Date Initiated: 03/06/2020 Target Resolution Date: 05/11/2020 Goal Status: Active Patient/caregiver will verbalize understanding of reason and process for debridement of necrotic tissue Date Initiated: 03/06/2020 Date Inactivated: 04/10/2020 Target Resolution Date: 03/06/2020 Goal Status: Met Interventions: Assess patient pain level pre-, during and post procedure and prior to discharge Provide education on necrotic tissue and debridement process Treatment Activities: Apply topical anesthetic as ordered : 03/06/2020 Notes: Pain, Acute or Chronic Nursing Diagnoses: Pain Management - Non-cyclic Acute (Procedural) Pain, acute or chronic: actual or  potential Goals: Patient will verbalize adequate pain control and receive pain control interventions during procedures as needed Date Initiated: 03/06/2020 Target Resolution Date: 05/11/2020 Goal Status: Active Interventions: Reposition patient for comfort Notes: Venous Leg Ulcer Nursing Diagnoses: Potential for venous Insuffiency (use before diagnosis confirmed) Goals: Patient will maintain optimal edema control Date Initiated: 03/06/2020 Target Resolution Date: 05/11/2020 Goal Status: Active Interventions: Assess peripheral edema status every visit. Compression as ordered Treatment Activities: [name], [name] ([id number]) Therapeutic compression applied : 03/06/2020 Notes: Electronic Signature(s) Signed: 05/18/2020 3:44:42 PM By: Grover Canavan Entered By: Grover Canavan on 05/18/2020 11:23:33 [name], [name] ([id number]) -------------------------------------------------------------------------------- Pain Assessment Details Patient Name: [name] Date of Service: 05/18/2020 10:30 AM Medical Record Number: [id number] Patient Account Number: [id number] Date of Birth/Sex: [birth year] (74 y.o. F) Treating RN: Cornell Barman Primary Care Gabriell Daigneault: [name] Other Clinician: Referring Pantera Winterrowd: [name] Treating Laurella Tull/Extender: Melburn Hake, HOYT Weeks in Treatment: 10 Active Problems Location of Pain Severity and Description of Pain Patient Has Paino Yes Site Locations Pain Location: Pain in Ulcers With Dressing Change: No Duration of the Pain. Constant / Intermittento Intermittent Rate the pain. Current Pain Level: 9 Worst Pain Level: 9 Least Pain Level: 0 Tolerable Pain Level: 3 Character  of Pain Describe the Pain: Aching, Throbbing Pain Management and Medication Current Pain Management: How does your wound impact your activities of daily livingo Sleep: Yes Notes Takes Excedrin for pain. Electronic Signature(s) Signed: 05/18/2020 11:55:23 AM By:  Darci Needle Signed: 05/22/2020 4:28:55 PM By: Gretta Cool, BSN, RN, CWS, Kim RN, BSN Entered By: Darci Needle on 05/18/2020 11:15:49 [name] ([id number]) -------------------------------------------------------------------------------- Patient/Caregiver Education Details Patient Name: [name] Date of Service: 05/18/2020 10:30 AM Medical Record Number: [id number] Patient Account Number: [id number] Date of Birth/Gender: 30-Sep-1945 (74 y.o. F) Treating RN: Grover Canavan Primary Care Physician: [name] Other Clinician: Referring Physician: [name] Treating Physician/Extender: Sharalyn Ink in Treatment: 10 Education Assessment Education Provided To: Patient Education Topics Provided Wound/Skin Impairment: Handouts: Caring for Your Ulcer Methods: Explain/Verbal Responses: State content correctly Electronic Signature(s) Signed: 05/18/2020 3:44:42 PM By: Grover Canavan Entered By: Grover Canavan on 05/18/2020 11:24:34 [name] ([id number]) -------------------------------------------------------------------------------- Wound Assessment Details Patient Name: [name] Date of Service: 05/18/2020 10:30 AM Medical Record Number: [id number] Patient Account Number: [id number] Date of Birth/Sex: 24-Aug-1946 (74 y.o. F) Treating RN: Cornell Barman Primary Care Damisha Wolff: [name] Other Clinician: Referring Tamra Koos: [name] Treating Bertin Inabinet/Extender: STONE III, HOYT Weeks in Treatment: 10 Wound Status Wound Number: 1 Primary Venous Leg Ulcer Etiology: Wound Location: Left, Posterior Lower Leg Wound Open Wounding Event: Gradually Appeared Status: Date Acquired: 03/06/2020 Comorbid Cataracts, Congestive Heart Failure, Hypertension, Type II Weeks Of Treatment: 10 History: Diabetes, Osteoarthritis, Neuropathy, Received Clustered Wound: No Chemotherapy, Received Radiation Photos Wound Measurements Length: (cm) 0.5 Width: (cm)  0.5 Depth: (cm) 0.1 Area: (cm) 0.196 Volume: (cm) 0.02 % Reduction in Area: 84% % Reduction in Volume: 83.7% Epithelialization: Small (1-33%) Wound Description Classification: Full Thickness Without Exposed Support Structures Wound Margin: Flat and Intact Exudate Amount: Large Exudate Type: Serous Exudate Color: amber Foul Odor After Cleansing: No Slough/Fibrino Yes Wound Bed Granulation Amount: Medium (34-66%) Exposed Structure Granulation Quality: Red Fascia Exposed: No Necrotic Amount: Medium (34-66%) Fat Layer (Subcutaneous Tissue) Exposed: Yes Necrotic Quality: Adherent Slough Tendon Exposed: No Muscle Exposed: No Joint Exposed: No Bone Exposed: No Treatment Notes Wound #1 (Left, Posterior Lower Leg) Notes tca cream to periwound area, scell, abd, 3 layer left with unna to anchor Electronic Signature(s) [name], [name] ([id number]) Signed: 05/18/2020 11:55:23 AM By: Darci Needle Signed: 05/22/2020 4:28:55 PM By: Gretta Cool, BSN, RN, CWS, Kim RN, BSN Entered By: Darci Needle on 05/18/2020 11:17:07 [name] ([id number]) -------------------------------------------------------------------------------- [place] Details Patient Name: [name] Date of Service: 05/18/2020 10:30 AM Medical Record Number: [id number] Patient Account Number: [id number] Date of Birth/Sex: [birth year] (74 y.o. F) Treating RN: Cornell Barman Primary Care Abrea Henle: [name] Other Clinician: Referring Cavion Faiola: [name] Treating Wilene Pharo/Extender: STONE III, HOYT Weeks in Treatment: 10 Vital Signs Time Taken: 11:05 Temperature (F): 97.8 Height (in): 69 Pulse (bpm): 61 Weight (lbs): 263 Respiratory Rate (breaths/min): 20 Body Mass Index (BMI): 38.8 Blood Pressure (mmHg): 132/66 Reference Range: 80 - 120 mg / dl Electronic Signature(s) Signed: 05/18/2020 3:00:51 PM By: Lorine Bears RCP, RRT, CHT Entered By: Lorine Bears on 05/18/2020  11:06:12

## 2020-05-23 ENCOUNTER — Ambulatory Visit
Admission: RE | Admit: 2020-05-23 | Discharge: 2020-05-23 | Disposition: A | Discharge status: Home / Self Care | Payer: Medicare HMO | Source: Ambulatory Visit | Attending: Oncology | Admitting: Oncology

## 2020-05-23 ENCOUNTER — Other Ambulatory Visit: Payer: Self-pay

## 2020-05-23 DIAGNOSIS — Z79811 Long term (current) use of aromatase inhibitors: Secondary | ICD-10-CM

## 2020-05-23 DIAGNOSIS — Z853 Personal history of malignant neoplasm of breast: Secondary | ICD-10-CM | POA: Insufficient documentation

## 2020-05-23 DIAGNOSIS — Z79899 Other long term (current) drug therapy: Secondary | ICD-10-CM | POA: Diagnosis present

## 2020-05-23 DIAGNOSIS — Z08 Encounter for follow-up examination after completed treatment for malignant neoplasm: Secondary | ICD-10-CM | POA: Insufficient documentation

## 2020-05-23 DIAGNOSIS — Z5181 Encounter for therapeutic drug level monitoring: Secondary | ICD-10-CM

## 2020-05-25 ENCOUNTER — Encounter: Payer: Medicare HMO | Admitting: Physician Assistant

## 2020-05-25 ENCOUNTER — Other Ambulatory Visit: Payer: Self-pay

## 2020-05-25 DIAGNOSIS — I872 Venous insufficiency (chronic) (peripheral): Secondary | ICD-10-CM | POA: Diagnosis not present

## 2020-05-25 NOTE — Progress Notes (Addendum)
[name] ([id number]) Visit Report for 05/25/2020 Chief Complaint Document Details Patient Name: [name], [name] Date of Service: 05/25/2020 3:00 PM Medical Record Number: [id number] Patient Account Number: [id number] Date of Birth/Sex: 12-07-1945 (74 y.o. F) Treating RN: Grover Canavan Primary Care Provider: [name] Other Clinician: Referring Provider: [name] Treating Provider/Extender: Melburn Hake, Lynora Dymond Weeks in Treatment: 11 Information Obtained from: Patient Chief Complaint Left LE Ulcer Electronic Signature(s) Signed: 05/25/2020 3:40:26 PM By: Worthy Keeler PA-C Entered By: Worthy Keeler on 05/25/2020 15:40:26 [name] ([id number]) -------------------------------------------------------------------------------- HPI Details Patient Name: [name] Date of Service: 05/25/2020 3:00 PM Medical Record Number: [id number] Patient Account Number: [id number] Date of Birth/Sex: 1945/09/24 (74 y.o. F) Treating RN: Grover Canavan Primary Care Provider: [name] Other Clinician: Referring Provider: [name] Treating Provider/Extender: Melburn Hake, Elric Tirado Weeks in Treatment: 11 History of Present Illness HPI Description: 03/15/2020 upon evaluation today patient presents today for initial evaluation here in our clinic concerning a wound that is on the left posterior lower extremity. Unfortunately this has been giving the patient some discomfort at this point which she notes has been affected her pretty much daily. The patient does have a history of venous insufficiency, hypertension, congestive heart failure, and a history of breast cancer. Fortunately there does not appear to be evidence of active infection at this time which is great news. No fevers, chills, nausea, vomiting, or diarrhea. Wound is not extremely large but does have some slough covering the surface of the wound. This is going require sharp debridement today. 03/15/2020 upon evaluation  today patient appears to be doing fairly well in regard to her wound. She does have some slough noted on the surface of the wound currently but I do believe the Iodoflex has been beneficial over the past week. There is no signs of active infection at this time which is great news and overall very pleased with the progress. No fevers, chills, nausea, vomiting, or diarrhea. 04/02/2020 upon evaluation today patient appears to be doing a little better in regard to her wound size wise this is not tremendously smaller she does have some slough buildup on the surface of the wound. With that being said this is going require some sharp debridement today to clear away some of the necrotic debris. Fortunately there is no evidence of active infection at this time. No fevers, chills, nausea, vomiting, or diarrhea. 04/10/2020 on evaluation today patient appears to be doing well with regard to her ulcer which is measuring somewhat smaller today. She actually has more granulation tissue noted which is also good news is no need for sharp debridement today. Fortunately there is no evidence of infection either which is also excellent. No fevers, chills, nausea, vomiting, or diarrhea. 04/26/2020 on evaluation today patient's wound actually is showing signs of good improvement which is great news. There does not appear to be any evidence of active infection. I do believe she is tolerating the collagen at this point. 05/03/2020 upon evaluation today patient's wound actually showed signs of good granulation at this time there does not appear to be any evidence of active infection which is great news and overall very pleased with where things stand. With that being said I do believe that the patient is can require some sharp debridement today but fortunately nothing too significant. 05/11/20 on evaluation today patient appears to be doing well in regard to her leg ulcer. She's been tolerating the dressing changes  without complication. Fortunately there is no signs of active infection at  this time. Overall I'm very pleased with where things stand. 05/18/2020 upon evaluation today patient's wound is showing signs of improvement is very dry and I think the collagen for the most part just dried to the wound bed. I am going to clean this away with sharp debridement in order to get this under control in my opinion. 05/25/2020 upon evaluation today patient actually appears to be doing well in regard to her leg ulcer. In fact upon inspection it appears she could potentially be completely healed but that is not guaranteed based on what we are seeing. There does not appear to be any signs of active infection which is great news. Electronic Signature(s) Signed: 05/25/2020 6:01:20 PM By: Worthy Keeler PA-C Entered By: Worthy Keeler on 05/25/2020 18:01:19 RAVEN, FURNAS ([id number]) -------------------------------------------------------------------------------- Physical Exam Details Patient Name: [name] Date of Service: 05/25/2020 3:00 PM Medical Record Number: [id number] Patient Account Number: [id number] Date of Birth/Sex: 01-04-1946 (74 y.o. F) Treating RN: Grover Canavan Primary Care Provider: [name] Other Clinician: Referring Provider: [name] Treating Provider/Extender: STONE III, Hanaan Gancarz Weeks in Treatment: 11 Constitutional Obese and well-hydrated in no acute distress. Respiratory normal breathing without difficulty. Psychiatric this patient is able to make decisions and demonstrates good insight into disease process. Alert and Oriented x 3. pleasant and cooperative. Notes Upon inspection patient's wound bed actually showed signs of good epithelization in fact this may be completely closed but fortunately there is no signs of active infection. I do believe we likely need to give her 1 more week with a wrap in order to ensure that this completely closes and the patient is in  agreement with that plan. Electronic Signature(s) Signed: 05/25/2020 6:02:05 PM By: Worthy Keeler PA-C Entered By: Worthy Keeler on 05/25/2020 18:02:05 [name] ([id number]) -------------------------------------------------------------------------------- Physician Orders Details Patient Name: [name] Date of Service: 05/25/2020 3:00 PM Medical Record Number: [id number] Patient Account Number: [id number] Date of Birth/Sex: [birth year] (74 y.o. F) Treating RN: Grover Canavan Primary Care Provider: [name] Other Clinician: Referring Provider: [name] Treating Provider/Extender: Melburn Hake, Eusebia Grulke Weeks in Treatment: 11 Verbal / Phone Orders: No Diagnosis Coding ICD-10 Coding Code Description I87.2 Venous insufficiency (chronic) (peripheral) L97.822 Non-pressure chronic ulcer of other part of left lower leg with fat layer exposed I10 Essential (primary) hypertension I50.42 Chronic combined systolic (congestive) and diastolic (congestive) heart failure C50.919 Malignant neoplasm of unspecified site of unspecified female breast Wound Cleansing o Cleanse wound with mild soap and water Anesthetic (add to Medication List) Wound #1 Left,Posterior Lower Leg o Topical Lidocaine 4% cream applied to wound bed prior to debridement (In Clinic Only). Secondary Dressing Wound #1 Left,Posterior Lower Leg o ABD pad Dressing Change Frequency Wound #1 Left,Posterior Lower Leg o Change dressing every week Follow-up Appointments Wound #1 Left,Posterior Lower Leg o Return Appointment in 1 week. Edema Control Wound #1 Left,Posterior Lower Leg o 3 Layer Compression System - Left Lower Extremity - TCA to leg to help with itching o Elevate legs to the level of the heart and pump ankles as often as possible Additional Orders / Instructions Wound #1 Left,Posterior Lower Leg o Vitamin A; Vitamin C, Zinc o Increase protein intake. Electronic  Signature(s) Signed: 05/25/2020 4:35:13 PM By: Grover Canavan Signed: 05/25/2020 6:21:17 PM By: Worthy Keeler PA-C Entered By: Grover Canavan on 05/25/2020 15:49:43 [name] ([id number]) -------------------------------------------------------------------------------- Problem List Details Patient Name: [name] Date of Service: 05/25/2020 3:00 PM Medical Record Number: [id number] Patient Account Number: [id number] Date of Birth/Sex:  1945-09-14 (74 y.o. F) Treating RN: Grover Canavan Primary Care Provider: [name] Other Clinician: Referring Provider: [name] Treating Provider/Extender: Melburn Hake, Eliezer Khawaja Weeks in Treatment: 11 Active Problems ICD-10 Encounter Code Description Active Date MDM Diagnosis I87.2 Venous insufficiency (chronic) (peripheral) 03/06/2020 No Yes L97.822 Non-pressure chronic ulcer of other part of left lower leg with fat layer 03/06/2020 No Yes exposed [place] (primary) hypertension 03/06/2020 No Yes I50.42 Chronic combined systolic (congestive) and diastolic (congestive) heart 03/06/2020 No Yes failure C50.919 Malignant neoplasm of unspecified site of unspecified female breast 03/06/2020 No Yes Inactive Problems Resolved Problems Electronic Signature(s) Signed: 05/25/2020 3:38:00 PM By: Worthy Keeler PA-C Entered By: Worthy Keeler on 05/25/2020 15:38:00 [name] ([id number]) -------------------------------------------------------------------------------- Progress Note Details Patient Name: [name] Date of Service: 05/25/2020 3:00 PM Medical Record Number: [id number] Patient Account Number: [id number] Date of Birth/Sex: 01/06/1946 (74 y.o. F) Treating RN: Grover Canavan Primary Care Provider: [name] Other Clinician: Referring Provider: [name] Treating Provider/Extender: Melburn Hake, Zaylyn Bergdoll Weeks in Treatment: 11 Subjective Chief Complaint Information obtained from Patient Left LE Ulcer History  of Present Illness (HPI) 03/15/2020 upon evaluation today patient presents today for initial evaluation here in our clinic concerning a wound that is on the left posterior lower extremity. Unfortunately this has been giving the patient some discomfort at this point which she notes has been affected her pretty much daily. The patient does have a history of venous insufficiency, hypertension, congestive heart failure, and a history of breast cancer. Fortunately there does not appear to be evidence of active infection at this time which is great news. No fevers, chills, nausea, vomiting, or diarrhea. Wound is not extremely large but does have some slough covering the surface of the wound. This is going require sharp debridement today. 03/15/2020 upon evaluation today patient appears to be doing fairly well in regard to her wound. She does have some slough noted on the surface of the wound currently but I do believe the Iodoflex has been beneficial over the past week. There is no signs of active infection at this time which is great news and overall very pleased with the progress. No fevers, chills, nausea, vomiting, or diarrhea. 04/02/2020 upon evaluation today patient appears to be doing a little better in regard to her wound size wise this is not tremendously smaller she does have some slough buildup on the surface of the wound. With that being said this is going require some sharp debridement today to clear away some of the necrotic debris. Fortunately there is no evidence of active infection at this time. No fevers, chills, nausea, vomiting, or diarrhea. 04/10/2020 on evaluation today patient appears to be doing well with regard to her ulcer which is measuring somewhat smaller today. She actually has more granulation tissue noted which is also good news is no need for sharp debridement today. Fortunately there is no evidence of infection either which is also excellent. No fevers, chills, nausea, vomiting, or  diarrhea. 04/26/2020 on evaluation today patient's wound actually is showing signs of good improvement which is great news. There does not appear to be any evidence of active infection. I do believe she is tolerating the collagen at this point. 05/03/2020 upon evaluation today patient's wound actually showed signs of good granulation at this time there does not appear to be any evidence of active infection which is great news and overall very pleased with where things stand. With that being said I do believe that the patient is can require  some sharp debridement today but fortunately nothing too significant. 05/11/20 on evaluation today patient appears to be doing well in regard to her leg ulcer. She's been tolerating the dressing changes without complication. Fortunately there is no signs of active infection at this time. Overall I'm very pleased with where things stand. 05/18/2020 upon evaluation today patient's wound is showing signs of improvement is very dry and I think the collagen for the most part just dried to the wound bed. I am going to clean this away with sharp debridement in order to get this under control in my opinion. 05/25/2020 upon evaluation today patient actually appears to be doing well in regard to her leg ulcer. In fact upon inspection it appears she could potentially be completely healed but that is not guaranteed based on what we are seeing. There does not appear to be any signs of active infection which is great news. Objective Constitutional Obese and well-hydrated in no acute distress. Vitals Time Taken: 3:30 PM, Height: 69 in, Weight: 263 lbs, BMI: 38.8, Temperature: 98.2 F, Pulse: 60 bpm, Respiratory Rate: 18 breaths/min, Blood Pressure: 152/64 mmHg. Respiratory normal breathing without difficulty. Psychiatric this patient is able to make decisions and demonstrates good insight into disease process. Alert and Oriented x 3. pleasant and cooperative. PAYSEN, GOZA  ([id number]) General Notes: Upon inspection patient's wound bed actually showed signs of good epithelization in fact this may be completely closed but fortunately there is no signs of active infection. I do believe we likely need to give her 1 more week with a wrap in order to ensure that this completely closes and the patient is in agreement with that plan. Integumentary (Hair, Skin) Wound #1 status is Open. Original cause of wound was Gradually Appeared. The wound is located on the Left,Posterior Lower Leg. The wound measures 0.4cm length x 0.4cm width x 0.1cm depth; 0.126cm^2 area and 0.013cm^3 volume. There is Fat Layer (Subcutaneous Tissue) exposed. There is a large amount of serous drainage noted. The wound margin is flat and intact. There is medium (34-66%) red granulation within the wound bed. There is a medium (34-66%) amount of necrotic tissue within the wound bed including Adherent Slough. Assessment Active Problems ICD-10 Venous insufficiency (chronic) (peripheral) Non-pressure chronic ulcer of other part of left lower leg with fat layer exposed Essential (primary) hypertension Chronic combined systolic (congestive) and diastolic (congestive) heart failure Malignant neoplasm of unspecified site of unspecified female breast Procedures Wound #1 Pre-procedure diagnosis of Wound #1 is a Venous Leg Ulcer located on the Left,Posterior Lower Leg . There was a Three Layer Compression Therapy Procedure by Grover Canavan, RN. Post procedure Diagnosis Wound #1: Same as Pre-Procedure Plan Wound Cleansing: Cleanse wound with mild soap and water Anesthetic (add to Medication List): Wound #1 Left,Posterior Lower Leg: Topical Lidocaine 4% cream applied to wound bed prior to debridement (In Clinic Only). Secondary Dressing: Wound #1 Left,Posterior Lower Leg: ABD pad Dressing Change Frequency: Wound #1 Left,Posterior Lower Leg: Change dressing every week Follow-up Appointments: Wound #1  Left,Posterior Lower Leg: Return Appointment in 1 week. Edema Control: Wound #1 Left,Posterior Lower Leg: 3 Layer Compression System - Left Lower Extremity - TCA to leg to help with itching Elevate legs to the level of the heart and pump ankles as often as possible Additional Orders / Instructions: Wound #1 Left,Posterior Lower Leg: Vitamin A; Vitamin C, Zinc Increase protein intake. [place] suggest currently that we continue with the compression wrap and the patient agrees that this is the  best option hopefully next week will be her discharge date. 2. We will put the ABD pad over the area just in case there is any drainage followed by a 3 layer compression wrap and triamcinolone to help with the itching. ERNIE, SAGRERO ([id number]) We will see patient back for reevaluation in 1 week here in the clinic. If anything worsens or changes patient will contact our office for additional recommendations. Electronic Signature(s) Signed: 05/25/2020 6:02:29 PM By: Worthy Keeler PA-C Entered By: Worthy Keeler on 05/25/2020 18:02:29 [name] ([id number]) -------------------------------------------------------------------------------- SuperBill Details Patient Name: [name] Date of Service: 05/25/2020 Medical Record Number: [id number] Patient Account Number: [id number] Date of Birth/Sex: Dec 17, 1945 (74 y.o. F) Treating RN: Grover Canavan Primary Care Provider: [name] Other Clinician: Referring Provider: [name] Treating Provider/Extender: Melburn Hake, Rosalio Catterton Weeks in Treatment: 11 Diagnosis Coding ICD-10 Codes Code Description I87.2 Venous insufficiency (chronic) (peripheral) L97.822 Non-pressure chronic ulcer of other part of left lower leg with fat layer exposed I10 Essential (primary) hypertension I50.42 Chronic combined systolic (congestive) and diastolic (congestive) heart failure C50.919 Malignant neoplasm of unspecified site of unspecified female  breast Facility Procedures CPT4 Code: 09470962 Description: (Facility Use Only) [id number] - [place] LWR LT LEG Modifier: Quantity: 1 Physician Procedures CPT4 Code: 7654650 Description: [id number] - WC PHYS LEVEL 3 - EST PT Modifier: Quantity: 1 CPT4 Code: Description: ICD-10 Diagnosis Description I87.2 Venous insufficiency (chronic) (peripheral) L97.822 Non-pressure chronic ulcer of other part of left lower leg with fat lay [place] (primary) hypertension Modifier: er exposed Quantity: Electronic Signature(s) Signed: 05/25/2020 6:02:48 PM By: Worthy Keeler PA-C Previous Signature: 05/25/2020 4:35:13 PM Version By: Grover Canavan Entered By: Worthy Keeler on 05/25/2020 18:02:47

## 2020-05-25 NOTE — Progress Notes (Signed)
[name], [name] ([id number]) Visit Report for 05/25/2020 Arrival Information Details Patient Name: [name], [name] Date of Service: 05/25/2020 3:00 PM Medical Record Number: [id number] Patient Account Number: [id number] Date of Birth/Sex: [birth year] (74 y.o. F) Treating RN: Grover Canavan Primary Care Yurianna Tusing: [name] Other Clinician: Referring Neeva Trew: [name] Treating Chavis Tessler/Extender: Melburn Hake, HOYT Weeks in Treatment: 11 Visit Information History Since Last Visit Added or deleted any medications: No Patient Arrived: Wheel Chair Any new allergies or adverse reactions: No Arrival Time: 15:29 Had a fall or experienced change in No Accompanied By: self activities of daily living that may affect Transfer Assistance: None risk of falls: Patient Identification Verified: Yes Signs or symptoms of abuse/neglect since last visito No Secondary Verification Process Completed: Yes Hospitalized since last visit: No Patient Requires Transmission-Based Precautions: No Implantable device outside of the clinic excluding No Patient Has Alerts: No cellular tissue based products placed in the center since last visit: Has Dressing in Place as Prescribed: Yes Has Compression in Place as Prescribed: Yes Pain Present Now: No Electronic Signature(s) Signed: 05/25/2020 4:13:12 PM By: Lorine Bears RCP, RRT, CHT Entered By: Lorine Bears on 05/25/2020 15:30:36 [name] ([id number]) -------------------------------------------------------------------------------- Compression Therapy Details Patient Name: [name] Date of Service: 05/25/2020 3:00 PM Medical Record Number: [id number] Patient Account Number: [id number] Date of Birth/Sex: 09-05-1946 (74 y.o. F) Treating RN: Grover Canavan Primary Care Skarleth Delmonico: [name] Other Clinician: Referring Yanilen Adamik: [name] Treating Miguelangel Korn/Extender: Melburn Hake, HOYT Weeks in Treatment:  11 Compression Therapy Performed for Wound Assessment: Wound #1 Left,Posterior Lower Leg Performed By: Clinician Grover Canavan, RN Compression Type: Three Layer Post Procedure Diagnosis Same as Pre-procedure Electronic Signature(s) Signed: 05/25/2020 4:35:13 PM By: Grover Canavan Entered By: Grover Canavan on 05/25/2020 15:48:16 [name] ([id number]) -------------------------------------------------------------------------------- Encounter Discharge Information Details Patient Name: [name] Date of Service: 05/25/2020 3:00 PM Medical Record Number: [id number] Patient Account Number: [id number] Date of Birth/Sex: [birth year] (74 y.o. F) Treating RN: Grover Canavan Primary Care Cori Henningsen: [name] Other Clinician: Referring Abbagayle Zaragoza: [name] Treating Heydi Swango/Extender: Melburn Hake, HOYT Weeks in Treatment: 11 Encounter Discharge Information Items Discharge Condition: Stable Ambulatory Status: Wheelchair Discharge Destination: Home Transportation: Private Auto Accompanied By: self Schedule Follow-up Appointment: Yes Clinical Summary of Care: Electronic Signature(s) Signed: 05/25/2020 4:35:13 PM By: Grover Canavan Entered By: Grover Canavan on 05/25/2020 15:50:55 [name] ([id number]) -------------------------------------------------------------------------------- Lower Extremity Assessment Details Patient Name: [name] Date of Service: 05/25/2020 3:00 PM Medical Record Number: [id number] Patient Account Number: [id number] Date of Birth/Sex: 03/11/1946 (74 y.o. F) Treating RN: Grover Canavan Primary Care Royce Sciara: [name] Other Clinician: Referring Dalonte Hardage: [name] Treating Ezrael Sam/Extender: STONE III, HOYT Weeks in Treatment: 11 Edema Assessment Assessed: [Left: Yes] [Right: No] Edema: [Left: Ye] [Right: s] Calf Left: Right: Point of Measurement: 32 cm From Medial Instep 35.5 cm cm Ankle Left: Right: Point of  Measurement: 12 cm From Medial Instep 26 cm cm Vascular Assessment Pulses: Dorsalis Pedis Palpable: [Left:Yes] Posterior Tibial Palpable: [Left:Yes] Electronic Signature(s) Signed: 05/25/2020 4:13:25 PM By: Darci Needle Signed: 05/25/2020 4:35:13 PM By: Grover Canavan Entered By: Darci Needle on 05/25/2020 15:43:24 [name] ([id number]) -------------------------------------------------------------------------------- Multi Wound Chart Details Patient Name: [name] Date of Service: 05/25/2020 3:00 PM Medical Record Number: [id number] Patient Account Number: [id number] Date of Birth/Sex: 06/24/1946 (74 y.o. F) Treating RN: Grover Canavan Primary Care Kongmeng Santoro: [name] Other Clinician: Referring Drishti Pepperman: [name] Treating Kamen Hanken/Extender: STONE III, HOYT Weeks in Treatment: 11 Vital Signs Height(in): 69 Pulse(bpm): 60 Weight(lbs): 638 Blood Pressure(mmHg): 152/64 Body  Mass Index(BMI): 39 Temperature(F): 98.2 Respiratory Rate(breaths/min): 18 Photos: [N/A:N/A] Wound Location: Left, Posterior Lower Leg N/A N/A Wounding Event: Gradually Appeared N/A N/A Primary Etiology: Venous Leg Ulcer N/A N/A Comorbid History: Cataracts, Congestive Heart Failure, N/A N/A Hypertension, Type II Diabetes, Osteoarthritis, Neuropathy, Received Chemotherapy, Received Radiation Date Acquired: 03/06/2020 N/A N/A Weeks of Treatment: 11 N/A N/A Wound Status: Open N/A N/A Measurements L x W x D (cm) 0.4x0.4x0.1 N/A N/A Area (cm) : 0.126 N/A N/A Volume (cm) : 0.013 N/A N/A % Reduction in Area: 89.70% N/A N/A % Reduction in Volume: 89.40% N/A N/A Classification: Full Thickness Without Exposed N/A N/A Support Structures Exudate Amount: Large N/A N/A Exudate Type: Serous N/A N/A Exudate Color: amber N/A N/A Wound Margin: Flat and Intact N/A N/A Granulation Amount: Medium (34-66%) N/A N/A Granulation Quality: Red N/A N/A Necrotic Amount: Medium (34-66%) N/A  N/A Exposed Structures: Fat Layer (Subcutaneous Tissue): N/A N/A Yes Fascia: No Tendon: No Muscle: No Joint: No Bone: No Epithelialization: Small (1-33%) N/A N/A Treatment Notes Electronic Signature(s) Signed: 05/25/2020 4:35:13 PM By: Grover Canavan Entered By: Grover Canavan on 05/25/2020 15:47:57 [name] ([id number]) [name], [name] ([id number]) -------------------------------------------------------------------------------- Multi-Disciplinary Care Plan Details Patient Name: [name] Date of Service: 05/25/2020 3:00 PM Medical Record Number: [id number] Patient Account Number: [id number] Date of Birth/Sex: 02/13/1946 (74 y.o. F) Treating RN: Grover Canavan Primary Care Alvino Lechuga: [name] Other Clinician: Referring Emmelia Holdsworth: [name] Treating Rhyli Depaula/Extender: Melburn Hake, HOYT Weeks in Treatment: 11 Active Inactive Necrotic Tissue Nursing Diagnoses: Impaired tissue integrity related to necrotic/devitalized tissue Knowledge deficit related to management of necrotic/devitalized tissue Goals: Necrotic/devitalized tissue will be minimized in the wound bed Date Initiated: 03/06/2020 Target Resolution Date: 05/11/2020 Goal Status: Active Patient/caregiver will verbalize understanding of reason and process for debridement of necrotic tissue Date Initiated: 03/06/2020 Date Inactivated: 04/10/2020 Target Resolution Date: 03/06/2020 Goal Status: Met Interventions: Assess patient pain level pre-, during and post procedure and prior to discharge Provide education on necrotic tissue and debridement process Treatment Activities: Apply topical anesthetic as ordered : 03/06/2020 Notes: Pain, Acute or Chronic Nursing Diagnoses: Pain Management - Non-cyclic Acute (Procedural) Pain, acute or chronic: actual or potential Goals: Patient will verbalize adequate pain control and receive pain control interventions during procedures as needed Date Initiated:  03/06/2020 Target Resolution Date: 05/11/2020 Goal Status: Active Interventions: Reposition patient for comfort Notes: Venous Leg Ulcer Nursing Diagnoses: Potential for venous Insuffiency (use before diagnosis confirmed) Goals: Patient will maintain optimal edema control Date Initiated: 03/06/2020 Target Resolution Date: 05/11/2020 Goal Status: Active Interventions: Assess peripheral edema status every visit. Compression as ordered Treatment Activities: [name], [name] ([id number]) Therapeutic compression applied : 03/06/2020 Notes: Electronic Signature(s) Signed: 05/25/2020 4:35:13 PM By: Grover Canavan Entered By: Grover Canavan on 05/25/2020 15:47:51 [name] ([id number]) -------------------------------------------------------------------------------- Pain Assessment Details Patient Name: [name] Date of Service: 05/25/2020 3:00 PM Medical Record Number: [id number] Patient Account Number: [id number] Date of Birth/Sex: 26-Mar-1946 (74 y.o. F) Treating RN: Grover Canavan Primary Care Nevin Grizzle: [name] Other Clinician: Referring Slade Pierpoint: [name] Treating Linh Hedberg/Extender: Melburn Hake, HOYT Weeks in Treatment: 11 Active Problems Location of Pain Severity and Description of Pain Patient Has Paino No Site Locations With Dressing Change: No Pain Management and Medication Current Pain Management: Electronic Signature(s) Signed: 05/25/2020 4:13:25 PM By: Darci Needle Signed: 05/25/2020 4:35:13 PM By: Grover Canavan Entered By: Darci Needle on 05/25/2020 15:40:58 [name] ([id number]) -------------------------------------------------------------------------------- Patient/Caregiver Education Details Patient Name: [name] Date of Service: 05/25/2020 3:00 PM Medical Record Number: [id number] Patient Account Number:  [id number] Date of Birth/Gender: 07/27/1946 (74 y.o. F) Treating RN: Grover Canavan Primary Care Physician:  [name] Other Clinician: Referring Physician: [name] Treating Physician/Extender: Sharalyn Ink in Treatment: 11 Education Assessment Education Provided To: Patient Education Topics Provided Wound/Skin Impairment: Handouts: Caring for Your Ulcer Methods: Explain/Verbal Responses: State content correctly Electronic Signature(s) Signed: 05/25/2020 4:35:13 PM By: Grover Canavan Entered By: Grover Canavan on 05/25/2020 15:47:41 [name] ([id number]) -------------------------------------------------------------------------------- Wound Assessment Details Patient Name: [name] Date of Service: 05/25/2020 3:00 PM Medical Record Number: [id number] Patient Account Number: [id number] Date of Birth/Sex: [birth year] (74 y.o. F) Treating RN: Grover Canavan Primary Care Roberta Angell: [name] Other Clinician: Referring Rosebud Koenen: [name] Treating Callin Ashe/Extender: Melburn Hake, HOYT Weeks in Treatment: 11 Wound Status Wound Number: 1 Primary Venous Leg Ulcer Etiology: Wound Location: Left, Posterior Lower Leg Wound Open Wounding Event: Gradually Appeared Status: Date Acquired: 03/06/2020 Comorbid Cataracts, Congestive Heart Failure, Hypertension, Type II Weeks Of Treatment: 11 History: Diabetes, Osteoarthritis, Neuropathy, Received Clustered Wound: No Chemotherapy, Received Radiation Photos Wound Measurements Length: (cm) 0.4 Width: (cm) 0.4 Depth: (cm) 0.1 Area: (cm) 0.126 Volume: (cm) 0.013 % Reduction in Area: 89.7% % Reduction in Volume: 89.4% Epithelialization: Small (1-33%) Wound Description Classification: Full Thickness Without Exposed Support Structu Wound Margin: Flat and Intact Exudate Amount: Large Exudate Type: Serous Exudate Color: amber res Foul Odor After Cleansing: No Slough/Fibrino Yes Wound Bed Granulation Amount: Medium (34-66%) Exposed Structure Granulation Quality: Red Fascia Exposed: No Necrotic Amount:  Medium (34-66%) Fat Layer (Subcutaneous Tissue) Exposed: Yes Necrotic Quality: Adherent Slough Tendon Exposed: No Muscle Exposed: No Joint Exposed: No Bone Exposed: No Treatment Notes Wound #1 (Left, Posterior Lower Leg) Notes tca cream to periwound area, abd, 3 layer left with unna to anchor Electronic Signature(s) [name], [name] ([id number]) Signed: 05/25/2020 4:13:25 PM By: Darci Needle Signed: 05/25/2020 4:35:13 PM By: Grover Canavan Entered By: Darci Needle on 05/25/2020 15:42:28 [name] ([id number]) -------------------------------------------------------------------------------- [place] Details Patient Name: [name] Date of Service: 05/25/2020 3:00 PM Medical Record Number: [id number] Patient Account Number: [id number] Date of Birth/Sex: [birth year] (74 y.o. F) Treating RN: Grover Canavan Primary Care Evanthia Maund: [name] Other Clinician: Referring Connar Keating: [name] Treating Gergory Biello/Extender: STONE III, HOYT Weeks in Treatment: 11 Vital Signs Time Taken: 15:30 Temperature (F): 98.2 Height (in): 69 Pulse (bpm): 60 Weight (lbs): 263 Respiratory Rate (breaths/min): 18 Body Mass Index (BMI): 38.8 Blood Pressure (mmHg): 152/64 Reference Range: 80 - 120 mg / dl Electronic Signature(s) Signed: 05/25/2020 4:13:12 PM By: Lorine Bears RCP, RRT, CHT Entered By: Lorine Bears on 05/25/2020 15:34:07

## 2020-05-28 ENCOUNTER — Other Ambulatory Visit: Payer: Self-pay

## 2020-05-28 ENCOUNTER — Encounter (INDEPENDENT_AMBULATORY_CARE_PROVIDER_SITE_OTHER): Payer: Self-pay | Admitting: Vascular Surgery

## 2020-05-28 ENCOUNTER — Ambulatory Visit (INDEPENDENT_AMBULATORY_CARE_PROVIDER_SITE_OTHER): Payer: Medicare HMO | Admitting: Vascular Surgery

## 2020-05-28 VITALS — BP 110/69 | HR 70 | Ht 69.0 in | Wt 261.0 lb

## 2020-05-28 DIAGNOSIS — I890 Lymphedema, not elsewhere classified: Secondary | ICD-10-CM

## 2020-05-28 DIAGNOSIS — I872 Venous insufficiency (chronic) (peripheral): Secondary | ICD-10-CM | POA: Diagnosis not present

## 2020-05-28 DIAGNOSIS — I739 Peripheral vascular disease, unspecified: Secondary | ICD-10-CM | POA: Diagnosis not present

## 2020-05-28 DIAGNOSIS — I10 Essential (primary) hypertension: Secondary | ICD-10-CM

## 2020-05-28 DIAGNOSIS — I509 Heart failure, unspecified: Secondary | ICD-10-CM

## 2020-05-30 ENCOUNTER — Encounter (INDEPENDENT_AMBULATORY_CARE_PROVIDER_SITE_OTHER): Payer: Self-pay | Admitting: Vascular Surgery

## 2020-05-30 DIAGNOSIS — I739 Peripheral vascular disease, unspecified: Secondary | ICD-10-CM | POA: Insufficient documentation

## 2020-05-30 NOTE — Progress Notes (Signed)
MRN : [id number]  [name] is a 74 y.o. (1946/07/22) female who presents with chief complaint of  Chief Complaint  Patient presents with  . New Patient (Initial Visit)    lyemphedema  .  History of Present Illness:   Patient is seen for evaluation of leg pain and leg swelling. The patient first noticed the swelling remotely. The swelling is associated with pain and discoloration. The pain and swelling worsens with prolonged dependency and improves with elevation. The pain is unrelated to activity.  The patient notes that in the morning the legs are significantly improved but they steadily worsened throughout the course of the day. The patient also notes a steady worsening of the discoloration in the ankle and shin area.   The patient denies claudication symptoms.  The patient denies symptoms consistent with rest pain.  The patient denies and extensive history of DJD and LS spine disease.  The patient has no had any past angiography, interventions or vascular surgery.  Elevation makes the leg symptoms better, dependency makes them much worse. There is no history of ulcerations. The patient denies any recent changes in medications.  The patient has not been wearing graduated compression.  The patient denies a history of DVT or PE. There is no prior history of phlebitis. There is no history of primary lymphedema.  No history of malignancies. No history of trauma or groin or pelvic surgery. There is no history of radiation treatment to the groin or pelvis  The patient denies amaurosis fugax or recent TIA symptoms. There are no recent neurological changes noted. The patient denies recent episodes of angina or shortness of breath  Current Meds  Medication Sig  . anastrozole (ARIMIDEX) 1 MG tablet TAKE 1 TABLET BY MOUTH DAILY. PATIENT MUST MAKE AN APPT TO SEE DR RAO  . aspirin EC 81 MG tablet Take 81 mg by mouth daily.    . cloNIDine (CATAPRES) 0.2 MG tablet Take 1 tablet (0.2  mg total) by mouth 2 (two) times daily.  . clotrimazole (LOTRIMIN) 1 % cream Apply 1 application topically 2 (two) times daily.   . [employer] CALCIUM 600 & VITAMIN D3 600-800 MG-UNIT TABS TAKE 1 TABLET BY MOUTH TWICE A DAY  . diclofenac sodium (VOLTAREN) 1 % GEL Apply 4 g topically 4 (four) times daily.  . diclofenac Sodium (VOLTAREN) 1 % GEL Apply 1 application topically as needed.  . Incontinence Supply Disposable (BLADDER CONTROL PADS EX ABSORB) MISC   . loratadine (CLARITIN) 10 MG tablet Take 10 mg by mouth daily.  Marland Kitchen losartan (COZAAR) 25 MG tablet Take 25 mg by mouth daily.   . methocarbamol (ROBAXIN) 500 MG tablet Take 500 mg by mouth 2 (two) times daily as needed.  Marland Kitchen omeprazole (PRILOSEC) 20 MG capsule Take 20 mg by mouth daily.  Marland Kitchen oxybutynin (DITROPAN) 5 MG tablet Take 5 mg by mouth 2 (two) times daily.  . potassium chloride SA (K-DUR) 20 MEQ tablet Take 1 tablet (20 meq) by mouth once daily  . tiZANidine (ZANAFLEX) 2 MG tablet 1 TAB BY MOUTH 3 TIMES DAILY AS NEEDED MUSCLE SPASM  . torsemide (DEMADEX) 20 MG tablet TAKE 1 TABLET BY MOUTH TWICE A DAY  . triamcinolone cream (KENALOG) 0.1 % Apply 1 application topically 2 (two) times daily. Back of legs    Past Medical History:  Diagnosis Date  . Abnormality of gait   . Breast cancer ([place]) 02/05/2005   LEFT 3.5 cm invasive mammary carcinoma, T2, N0,; triple negative,  whole breast radiation, cytoxan, taxotere chemotherapy.   . Breast cancer ([place]) 2019   right breast  . CHF (congestive heart failure) ([place])    March 28, 2017 echo: EF 50-55%; 1) echo 07/2011: EF 20-25%, mild concentric hypertrophy, diffuse HK, regional WMA cannot be excluded, grade 1 DD, mitral valve with mild regurg, LA mildly dilated). 2) EF 40-45%, mild AS,                    . Complication of anesthesia 2006   pt states "hard to wake up" after L breast surgery   . Edema   . GERD (gastroesophageal reflux disease)   . Headache   . Heart murmur   . Hypertension   .  Hypokinesis    global, EF 35-45 % from Echo.  . Malignant neoplasm of breast (female), unspecified site 06/14/2018   RIGHT T1b, N0; ER/PR positive, HER-2 negative.  . Neuropathy   . Obesity   . Pain in joint, site unspecified   . Personal history of chemotherapy 2006   left breast  . Personal history of radiation therapy 2019   right breast  . Personal history of radiation therapy 2006   left breast  . Pneumonia   . Sleep apnea   . Tinea pedis   . Unspecified hearing loss    bilateral    Past Surgical History:  Procedure Laterality Date  . BREAST BIOPSY Right 2019   11 oc-INVASIVE MAMMARY CARCINOMA, NO SPECIAL TYPE  . BREAST BIOPSY Right 2019   1030 oc- neg  . BREAST LUMPECTOMY  2006   left breast   . CATARACT EXTRACTION     left eye  . HERNIA REPAIR  05/25/2006   Incarcerated omentum in ventral hernia, Ventralight mesh, combined lap/ open with omental resection.   Marland Kitchen PARTIAL MASTECTOMY WITH NEEDLE LOCALIZATION Right 06/14/2018   Procedure: PARTIAL MASTECTOMY WITH NEEDLE LOCALIZATION;  Surgeon: Robert Bellow, MD;  Location: [facility] ORS;  Service: General;  Laterality: Right;  . PORT-A-CATH REMOVAL  05/25/2006  . SENTINEL NODE BIOPSY Right 06/14/2018   Procedure: SENTINEL NODE BIOPSY;  Surgeon: Robert Bellow, MD;  Location: [facility] ORS;  Service: General;  Laterality: Right;  . TUBAL LIGATION      Social History Social History   Tobacco Use  . Smoking status: Former Smoker    Packs/day: 0.50    Years: 30.00    Pack years: 15.00    Types: Cigarettes    Quit date: 07/14/2000    Years since quitting: 19.8  . Smokeless tobacco: Never Used  Vaping Use  . Vaping Use: Never used  Substance Use Topics  . Alcohol use: No  . Drug use: Not Currently    Types: Marijuana    Comment: endorses prior marijuana use 1999    Family History Family History  Problem Relation Age of Onset  . Hypertension Mother        deceased 62  . Cataracts Mother   . Breast cancer Other  21       maternal half-sister; deceased 36  . Coronary artery disease Other   No family history of bleeding/clotting disorders, porphyria or autoimmune disease   No Known Allergies   REVIEW OF SYSTEMS (Negative unless checked)  Constitutional: _0 Weight loss  _1 Fever  _2 Chills Cardiac: _3 Chest pain   _4 Chest pressure   _5 Palpitations   _6 Shortness of breath when laying flat   _7 Shortness of breath with exertion. Vascular:  _8 Pain in legs with walking   [  x]Pain in legs at rest  _0 History of DVT   _1 Phlebitis   _2 Swelling in legs   _3 Varicose veins   _4 Non-healing ulcers Pulmonary:   _5 Uses home oxygen   _6 Productive cough   _7 Hemoptysis   _8 Wheeze  _9 COPD   _10 Asthma Neurologic:  _11 Dizziness   _12 Seizures   _13 History of stroke   _14 History of TIA  _15 Aphasia   _16 Vissual changes   _17 Weakness or numbness in arm   _18 Weakness or numbness in leg Musculoskeletal:   _19 Joint swelling   _20 Joint pain   _21 Low back pain Hematologic:  _22 Easy bruising  _23 Easy bleeding   _24 Hypercoagulable state   _25 Anemic Gastrointestinal:  _26 Diarrhea   _27 Vomiting  _28 Gastroesophageal reflux/heartburn   _29 Difficulty swallowing. Genitourinary:  _30 Chronic kidney disease   _31 Difficult urination  _32 Frequent urination   _33 Blood in urine Skin:  _34 Rashes   _35 Ulcers  Psychological:  _36 History of anxiety   _37  History of major depression.  Physical Examination  Vitals:   05/28/20 1405  BP: 110/69  Pulse: 70  Weight: 261 lb (118.4 kg)  Height: _38  (1.753 m)   Body mass index is 38.54 kg/m. Gen: WD/WN, NAD Head: /AT, No temporalis wasting.  Ear/Nose/Throat: Hearing grossly intact, nares w/o erythema or drainage, poor dentition Eyes: PER, EOMI, sclera nonicteric.  Neck: Supple, no masses.  No bruit or JVD.  Pulmonary:  Good air movement, clear to auscultation bilaterally, no use of accessory muscles.  Cardiac: RRR, normal S1, S2, no Murmurs. Vascular: scattered varicosities present bilaterally.  Moderate venous stasis  changes to the legs bilaterally.  3-4+ soft pitting edema bilaterally Vessel Right Left  Radial Palpable Palpable  PT Not Palpable Not Palpable  DP Not Palpable Not Palpable  Gastrointestinal: soft, non-distended. No guarding/no peritoneal signs.  Musculoskeletal: M/S 5/5 throughout.  No deformity or atrophy.  Neurologic: CN 2-12 intact. Pain and light touch intact in extremities.  Symmetrical.  Speech is fluent. Motor exam as listed above. Psychiatric: Judgment intact, Mood & affect appropriate for pt's clinical situation. Dermatologic: Venous rashes no ulcers noted.  No changes consistent with cellulitis. Lymph : Mild to moderate lichenification and skin changes of chronic lymphedema.  CBC Lab Results  Component Value Date   WBC 7.4 02/10/2020   HGB 12.4 02/10/2020   HCT 37.7 02/10/2020   MCV 84.3 02/10/2020   PLT 327 02/10/2020    BMET    Component Value Date/Time   NA 139 02/10/2020 1016   NA 147 (H) 11/20/2017 1424   NA 143 03/22/2014 0403   K 4.4 02/10/2020 1016   K 3.4 (L) 03/22/2014 0403   CL 100 02/10/2020 1016   CL 107 03/22/2014 0403   CO2 27 02/10/2020 1016   CO2 32 03/22/2014 0403   GLUCOSE 114 (H) 02/10/2020 1016   GLUCOSE 88 03/22/2014 0403   BUN 24 (H) 02/10/2020 1016   BUN 14 11/20/2017 1424   BUN 17 03/22/2014 0403   CREATININE 1.41 (H) 02/10/2020 1016   CREATININE 0.99 03/22/2014 0403   CALCIUM 9.0 02/10/2020 1016   CALCIUM 8.2 (L) 03/22/2014 0403   GFRNONAA 37 (L) 02/10/2020 1016   GFRNONAA 59 (L) 03/22/2014 0403   GFRAA 42 (L) 02/10/2020 1016   GFRAA >60 03/22/2014 0403   CrCl cannot be calculated (Patient's most recent lab result is older than the maximum 21 days allowed.).  COAG Lab Results  Component Value Date   INR 1.07 03/28/2017   INR 1.1 03/22/2014    Radiology DG Bone Density  Result Date: 05/23/2020  EXAM: DUAL X-RAY ABSORPTIOMETRY (DXA) FOR BONE MINERAL DENSITY IMPRESSION: Your patient Sherah Lund completed a BMD test on  05/23/2020 using the [place] (software version: 14.10) manufactured by [employer]. The following summarizes the results of our evaluation. Technologist:VLM PATIENT BIOGRAPHICAL: Name: Hailie, Searight Patient ID: [id number] Birth Date: 12-12-1945 Height: 69.0 in. Gender: Female Exam Date: 05/23/2020 Weight: 261.0 lbs. Indications: History of Chemo, Postmenopausal, Breast CA, Advanced Age, COPD Fractures: Treatments: Arimidex, Calcium DENSITOMETRY RESULTS: Site         Region     Measured Date Measured Age WHO Classification Young Adult T-score BMD         %Change vs. Previous Significant Change (*) AP Spine L1-L4 05/23/[age].5 Normal 0.2 1.221 g/cm2 - - DualFemur Neck Right 05/23/2020 74.5 Osteopenia -1.9 0.777 g/cm2 -29.7% Yes DualFemur Neck Right 06/02/2018 72.5 Normal 0.5 1.106 g/cm2 - - DualFemur Total Mean 05/23/2020 74.5 Normal -1.0 0.878 g/cm2 -12.3% Yes DualFemur Total Mean 06/02/2018 72.5 Normal -0.1 1.001 g/cm2 - - Left Forearm Radius 33% 05/23/2020 74.5 Normal 2.1 1.063 g/cm2 -1.8% - Left Forearm Radius 33% 06/02/2018 72.5 Normal 2.4 1.083 g/cm2 - - ASSESSMENT: The BMD measured at Femur Neck Right is 0.777 g/cm2 with a T-score of -1.9. This patient is considered osteopenic according to [facility]) criteria. Compared with prior study, there has been no significant change in the spine. Compared with prior study, there has been no significant change in the total hip. The scan quality was limited due to patient mobility. World Pharmacologist [facility]) criteria for post-menopausal, Caucasian Women: Normal:                   T-score at or above -1 SD Osteopenia/low bone mass: T-score between -1 and -2.5 SD Osteoporosis:             T-score at or below -2.5 SD RECOMMENDATIONS: 1. All patients should optimize calcium and vitamin D intake. 2. Consider FDA-approved medical therapies in postmenopausal women and men aged 15 years and older, based on the following: a.  A hip or vertebral(clinical or morphometric) fracture b. T-score < -2.5 at the femoral neck or spine after appropriate evaluation to exclude secondary causes c. Low bone mass (T-score between -1.0 and -2.5 at the femoral neck or spine) and a 10-year probability of a hip fracture > 3% or a 10-year probability of a major osteoporosis-related fracture > 20% based on the [place]-adapted WHO algorithm 3. Clinician judgment and/or patient preferences may indicate treatment for people with 10-year fracture probabilities above or below these levels FOLLOW-UP: People with diagnosed cases of osteoporosis or at high risk for fracture should have regular bone mineral density tests. For patients eligible for Medicare, routine testing is allowed once every 2 years. The testing frequency can be increased to one year for patients who have rapidly progressing disease, those who are receiving or discontinuing medical therapy to restore bone mass, or have additional risk factors. I have reviewed this report, and agree with the above findings. [facility] Dear Weston Anna RAO, Your patient Venita Seng completed a FRAX assessment on 05/23/2020 using the [place] (analysis version: 14.10) manufactured by [employer]. The following summarizes the results of our evaluation. PATIENT BIOGRAPHICAL: Name: Neetu, Carrozza Patient ID: [id number] Birth Date: [birth year] Height:    69.0 in. Gender:     Female    [age]  Weight:    261.0 lbs. Ethnicity:  Black                            Exam Date: 05/23/2020 FRAX* RESULTS:  (version: 3.5) 10-year Probability of Fracture1 Major Osteoporotic Fracture2 Hip Fracture 5.0% 1.1% Population: [place] (Black) Risk Factors: None Based on Femur (Right) Neck BMD 1 -The 10-year probability of fracture may be lower than reported if the patient has received treatment. 2 -Major Osteoporotic Fracture: Clinical Spine, Forearm, Hip or Shoulder *FRAX is a Materials engineer of the  [employer] for Metabolic Bone Disease, a Ruth (WHO) [facility]. ASSESSMENT: The probability of a major osteoporotic fracture is 5.0%within the next ten years. The probability of a hip fracture is 1.1% within the next ten years. . Electronically Signed   By: Rolm Baptise M.D.   On: 05/23/2020 11:54   MM DIAG BREAST TOMO BILATERAL  Result Date: 05/23/2020 CLINICAL DATA:  History of a RIGHT lumpectomy in 2019 and LEFT lumpectomy in 2006. Patient could not safely be positioned to do magnification views of the lumpectomy bed EXAM: DIGITAL DIAGNOSTIC BILATERAL MAMMOGRAM WITH TOMO AND CAD COMPARISON:  Previous exam(s). ACR Breast Density Category b: There are scattered areas of fibroglandular density. FINDINGS: There is density and architectural distortion within the RIGHT breast, consistent with postsurgical changes. These are stable in comparison to prior. There is density and architectural distortion within the LEFT breast, consistent with postsurgical changes. These are stable in comparison to prior. No new suspicious finding bilaterally. Mammographic images were processed with CAD. IMPRESSION: No mammographic evidence of malignancy. RECOMMENDATION: Diagnostic mammogram is suggested in 1 year. (Code:DM-B-01Y) I have discussed the findings and recommendations with the patient. If applicable, a reminder letter will be sent to the patient regarding the next appointment. BI-RADS CATEGORY  2: Benign. Electronically Signed   By: Valentino Saxon MD   On: 05/23/2020 11:23      Assessment/Plan 1. Lymphedema I have had a long discussion with the patient regarding swelling and why it  causes symptoms.  Patient will begin wearing graduated compression stockings class 1 (20-30 mmHg) on a daily basis a prescription was given. The patient will  beginning wearing the stockings first thing in the morning and removing them in the evening. The patient is  instructed specifically not to sleep in the stockings.   In addition, behavioral modification will be initiated.  This will include frequent elevation, use of over the counter pain medications and exercise such as walking.  I have reviewed systemic causes for chronic edema such as liver, kidney and cardiac etiologies.  The patient denies problems with these organ systems.    Consideration for a lymph pump will also be made based upon the effectiveness of conservative therapy.  This would help to improve the edema control and prevent sequela such as ulcers and infections   Patient should undergo duplex ultrasound of the venous system to ensure that DVT or reflux is not present.  The patient will follow-up with me after the ultrasound.   - VAS [place] LOWER EXTREMITY VENOUS REFLUX; Future  2. Venous insufficiency of both lower extremities I have had a long discussion with the patient regarding swelling and why it  causes symptoms.  Patient will begin wearing graduated compression stockings class 1 (20-30 mmHg) on a daily basis a prescription was given. The patient will  beginning wearing the stockings first thing in the morning  and removing them in the evening. The patient is instructed specifically not to sleep in the stockings.   In addition, behavioral modification will be initiated.  This will include frequent elevation, use of over the counter pain medications and exercise such as walking.  I have reviewed systemic causes for chronic edema such as liver, kidney and cardiac etiologies.  The patient denies problems with these organ systems.    Consideration for a lymph pump will also be made based upon the effectiveness of conservative therapy.  This would help to improve the edema control and prevent sequela such as ulcers and infections   Patient should undergo duplex ultrasound of the venous system to ensure that DVT or reflux is not present.  The patient will follow-up with me after the  ultrasound.   - VAS [place] LOWER EXTREMITY VENOUS REFLUX; Future  3. PAD (peripheral artery disease) ([place]) Recommend:  Patient should undergo ABI's of the lower extremity because of the patient's lower extremity symptoms.  The patient states they are having increased pain and a marked decrease in the distance that they can walk.  The risks and benefits as well as the alternatives were discussed in detail with the patient.  All questions were answered.  Patient agrees to proceed and understands this could be a prelude to angiography and intervention.  The patient will follow up with me in the office to review the studies.   - VAS [place] ABI WITH/WO TBI; Future  4. Essential hypertension Continue antihypertensive medications as already ordered, these medications have been reviewed and there are no changes at this time.   5. Congestive heart failure, unspecified HF chronicity, unspecified heart failure type ([place]) Continue cardiac and antihypertensive medications as already ordered and reviewed, no changes at this time.  Continue statin as ordered and reviewed, no changes at this time  Nitrates PRN for chest pain     Hortencia Pilar, MD  05/30/2020 11:28 AM

## 2020-05-31 ENCOUNTER — Encounter: Payer: Medicare HMO | Admitting: Physician Assistant

## 2020-05-31 ENCOUNTER — Telehealth (INDEPENDENT_AMBULATORY_CARE_PROVIDER_SITE_OTHER): Payer: Self-pay

## 2020-05-31 ENCOUNTER — Other Ambulatory Visit: Payer: Self-pay

## 2020-05-31 DIAGNOSIS — I872 Venous insufficiency (chronic) (peripheral): Secondary | ICD-10-CM | POA: Diagnosis not present

## 2020-05-31 NOTE — Telephone Encounter (Signed)
I returned call to pt from message left on the nurses line and made her aware of appointment on 9/27.

## 2020-05-31 NOTE — Progress Notes (Addendum)
XIA, STOHR ([id number]) Visit Report for 05/31/2020 Chief Complaint Document Details Patient Name: [name], [name] Date of Service: 05/31/2020 12:30 PM Medical Record Number: [id number] Patient Account Number: [id number] Date of Birth/Sex: 01/31/1946 (74 y.o. F) Treating RN: Cornell Barman Primary Care Provider: [name] Other Clinician: Referring Provider: [name] Treating Provider/Extender: Melburn Hake, Naasir Carreira Weeks in Treatment: 12 Information Obtained from: Patient Chief Complaint Left LE Ulcer Electronic Signature(s) Signed: 05/31/2020 1:35:23 PM By: Worthy Keeler PA-C Entered By: Worthy Keeler on 05/31/2020 13:35:23 [name] ([id number]) -------------------------------------------------------------------------------- HPI Details Patient Name: [name] Date of Service: 05/31/2020 12:30 PM Medical Record Number: [id number] Patient Account Number: [id number] Date of Birth/Sex: 13-Dec-1945 (74 y.o. F) Treating RN: Cornell Barman Primary Care Provider: [name] Other Clinician: Referring Provider: [name] Treating Provider/Extender: Melburn Hake, Brylan Seubert Weeks in Treatment: 12 History of Present Illness HPI Description: 03/15/2020 upon evaluation today patient presents today for initial evaluation here in our clinic concerning a wound that is on the left posterior lower extremity. Unfortunately this has been giving the patient some discomfort at this point which she notes has been affected her pretty much daily. The patient does have a history of venous insufficiency, hypertension, congestive heart failure, and a history of breast cancer. Fortunately there does not appear to be evidence of active infection at this time which is great news. No fevers, chills, nausea, vomiting, or diarrhea. Wound is not extremely large but does have some slough covering the surface of the wound. This is going require sharp debridement today. 03/15/2020 upon evaluation today patient  appears to be doing fairly well in regard to her wound. She does have some slough noted on the surface of the wound currently but I do believe the Iodoflex has been beneficial over the past week. There is no signs of active infection at this time which is great news and overall very pleased with the progress. No fevers, chills, nausea, vomiting, or diarrhea. 04/02/2020 upon evaluation today patient appears to be doing a little better in regard to her wound size wise this is not tremendously smaller she does have some slough buildup on the surface of the wound. With that being said this is going require some sharp debridement today to clear away some of the necrotic debris. Fortunately there is no evidence of active infection at this time. No fevers, chills, nausea, vomiting, or diarrhea. 04/10/2020 on evaluation today patient appears to be doing well with regard to her ulcer which is measuring somewhat smaller today. She actually has more granulation tissue noted which is also good news is no need for sharp debridement today. Fortunately there is no evidence of infection either which is also excellent. No fevers, chills, nausea, vomiting, or diarrhea. 04/26/2020 on evaluation today patient's wound actually is showing signs of good improvement which is great news. There does not appear to be any evidence of active infection. I do believe she is tolerating the collagen at this point. 05/03/2020 upon evaluation today patient's wound actually showed signs of good granulation at this time there does not appear to be any evidence of active infection which is great news and overall very pleased with where things stand. With that being said I do believe that the patient is can require some sharp debridement today but fortunately nothing too significant. 05/11/20 on evaluation today patient appears to be doing well in regard to her leg ulcer. She's been tolerating the dressing changes without complication.  Fortunately there is no signs of active infection at  this time. Overall I'm very pleased with where things stand. 05/18/2020 upon evaluation today patient's wound is showing signs of improvement is very dry and I think the collagen for the most part just dried to the wound bed. I am going to clean this away with sharp debridement in order to get this under control in my opinion. 05/25/2020 upon evaluation today patient actually appears to be doing well in regard to her leg ulcer. In fact upon inspection it appears she could potentially be completely healed but that is not guaranteed based on what we are seeing. There does not appear to be any signs of active infection which is great news. 05/31/2020 on evaluation today patient appears to be doing well with regard to her wounds. She in fact appears to be almost completely healed on the left leg there is just a very small opening remaining point 1 Electronic Signature(s) Signed: 05/31/2020 5:19:29 PM By: Worthy Keeler PA-C Entered By: Worthy Keeler on 05/31/2020 17:19:29 [name], [name] ([id number]) -------------------------------------------------------------------------------- Physical Exam Details Patient Name: [name] Date of Service: 05/31/2020 12:30 PM Medical Record Number: [id number] Patient Account Number: [id number] Date of Birth/Sex: 10/19/1945 (74 y.o. F) Treating RN: Cornell Barman Primary Care Provider: [name] Other Clinician: Referring Provider: [name] Treating Provider/Extender: STONE III, Camdon Saetern Weeks in Treatment: 12 Constitutional Obese and well-hydrated in no acute distress. Respiratory normal breathing without difficulty. Psychiatric this patient is able to make decisions and demonstrates good insight into disease process. Alert and Oriented x 3. pleasant and cooperative. Notes Upon inspection patient's wound bed actually showed signs of good epithelization I barely see anything that is still remaining  open on the lateral leg on the left I think she will probably be ready for discharge next week with some type of compression stockings. I think a Velcro compression wraps would be of benefit as I do not believe she can get just a standard stocking on. Electronic Signature(s) Signed: 05/31/2020 5:19:51 PM By: Worthy Keeler PA-C Entered By: Worthy Keeler on 05/31/2020 17:19:51 [name] ([id number]) -------------------------------------------------------------------------------- Physician Orders Details Patient Name: [name] Date of Service: 05/31/2020 12:30 PM Medical Record Number: [id number] Patient Account Number: [id number] Date of Birth/Sex: [birth year] (74 y.o. F) Treating RN: Grover Canavan Primary Care Provider: [name] Other Clinician: Referring Provider: [name] Treating Provider/Extender: Melburn Hake, Tyrian Peart Weeks in Treatment: 12 Verbal / Phone Orders: No Diagnosis Coding ICD-10 Coding Code Description I87.2 Venous insufficiency (chronic) (peripheral) L97.822 Non-pressure chronic ulcer of other part of left lower leg with fat layer exposed I10 Essential (primary) hypertension I50.42 Chronic combined systolic (congestive) and diastolic (congestive) heart failure C50.919 Malignant neoplasm of unspecified site of unspecified female breast Wound Cleansing o Cleanse wound with mild soap and water Anesthetic (add to Medication List) Wound #1 Left,Posterior Lower Leg o Topical Lidocaine 4% cream applied to wound bed prior to debridement (In Clinic Only). Secondary Dressing Wound #1 Left,Posterior Lower Leg o ABD pad Dressing Change Frequency Wound #1 Left,Posterior Lower Leg o Change dressing every week Follow-up Appointments Wound #1 Left,Posterior Lower Leg o Return Appointment in 1 week. Edema Control Wound #1 Left,Posterior Lower Leg o 3 Layer Compression System - Left Lower Extremity - TCA to leg to help with itching o Elevate  legs to the level of the heart and pump ankles as often as possible o Other: - Tubi F to right leg Additional Orders / Instructions Wound #1 Left,Posterior Lower Leg o Vitamin A; Vitamin C, Zinc o Increase protein  intake. Electronic Signature(s) Signed: 05/31/2020 5:09:50 PM By: Grover Canavan Signed: 05/31/2020 5:29:43 PM By: Worthy Keeler PA-C Entered By: Grover Canavan on 05/31/2020 13:47:13 [name] ([id number]) -------------------------------------------------------------------------------- Problem List Details Patient Name: [name] Date of Service: 05/31/2020 12:30 PM Medical Record Number: [id number] Patient Account Number: [id number] Date of Birth/Sex: Oct 29, 1945 (74 y.o. F) Treating RN: Cornell Barman Primary Care Provider: [name] Other Clinician: Referring Provider: [name] Treating Provider/Extender: Melburn Hake, Obed Samek Weeks in Treatment: 12 Active Problems ICD-10 Encounter Code Description Active Date MDM Diagnosis I87.2 Venous insufficiency (chronic) (peripheral) 03/06/2020 No Yes L97.822 Non-pressure chronic ulcer of other part of left lower leg with fat layer 03/06/2020 No Yes exposed I10 Essential (primary) hypertension 03/06/2020 No Yes I50.42 Chronic combined systolic (congestive) and diastolic (congestive) heart 03/06/2020 No Yes failure C50.919 Malignant neoplasm of unspecified site of unspecified female breast 03/06/2020 No Yes Inactive Problems Resolved Problems Electronic Signature(s) Signed: 05/31/2020 1:35:17 PM By: Worthy Keeler PA-C Entered By: Worthy Keeler on 05/31/2020 13:35:16 [name] ([id number]) -------------------------------------------------------------------------------- Progress Note Details Patient Name: [name] Date of Service: 05/31/2020 12:30 PM Medical Record Number: [id number] Patient Account Number: [id number] Date of Birth/Sex: August 12, 1946 (74 y.o. F) Treating RN: Cornell Barman Primary  Care Provider: [name] Other Clinician: Referring Provider: [name] Treating Provider/Extender: Melburn Hake, Rayn Enderson Weeks in Treatment: 12 Subjective Chief Complaint Information obtained from Patient Left LE Ulcer History of Present Illness (HPI) 03/15/2020 upon evaluation today patient presents today for initial evaluation here in our clinic concerning a wound that is on the left posterior lower extremity. Unfortunately this has been giving the patient some discomfort at this point which she notes has been affected her pretty much daily. The patient does have a history of venous insufficiency, hypertension, congestive heart failure, and a history of breast cancer. Fortunately there does not appear to be evidence of active infection at this time which is great news. No fevers, chills, nausea, vomiting, or diarrhea. Wound is not extremely large but does have some slough covering the surface of the wound. This is going require sharp debridement today. 03/15/2020 upon evaluation today patient appears to be doing fairly well in regard to her wound. She does have some slough noted on the surface of the wound currently but I do believe the Iodoflex has been beneficial over the past week. There is no signs of active infection at this time which is great news and overall very pleased with the progress. No fevers, chills, nausea, vomiting, or diarrhea. 04/02/2020 upon evaluation today patient appears to be doing a little better in regard to her wound size wise this is not tremendously smaller she does have some slough buildup on the surface of the wound. With that being said this is going require some sharp debridement today to clear away some of the necrotic debris. Fortunately there is no evidence of active infection at this time. No fevers, chills, nausea, vomiting, or diarrhea. 04/10/2020 on evaluation today patient appears to be doing well with regard to her ulcer which is measuring somewhat smaller  today. She actually has more granulation tissue noted which is also good news is no need for sharp debridement today. Fortunately there is no evidence of infection either which is also excellent. No fevers, chills, nausea, vomiting, or diarrhea. 04/26/2020 on evaluation today patient's wound actually is showing signs of good improvement which is great news. There does not appear to be any evidence of active infection. I do believe she is tolerating the collagen  at this point. 05/03/2020 upon evaluation today patient's wound actually showed signs of good granulation at this time there does not appear to be any evidence of active infection which is great news and overall very pleased with where things stand. With that being said I do believe that the patient is can require some sharp debridement today but fortunately nothing too significant. 05/11/20 on evaluation today patient appears to be doing well in regard to her leg ulcer. She's been tolerating the dressing changes without complication. Fortunately there is no signs of active infection at this time. Overall I'm very pleased with where things stand. 05/18/2020 upon evaluation today patient's wound is showing signs of improvement is very dry and I think the collagen for the most part just dried to the wound bed. I am going to clean this away with sharp debridement in order to get this under control in my opinion. 05/25/2020 upon evaluation today patient actually appears to be doing well in regard to her leg ulcer. In fact upon inspection it appears she could potentially be completely healed but that is not guaranteed based on what we are seeing. There does not appear to be any signs of active infection which is great news. 05/31/2020 on evaluation today patient appears to be doing well with regard to her wounds. She in fact appears to be almost completely healed on the left leg there is just a very small opening remaining point  1 Objective Constitutional Obese and well-hydrated in no acute distress. Vitals Time Taken: 1:10 AM, Height: 69 in, Weight: 263 lbs, BMI: 38.8, Temperature: 98 F, Pulse: 67 bpm, Respiratory Rate: 24 breaths/min, Blood Pressure: 143/64 mmHg. Respiratory normal breathing without difficulty. [name], [name] ([id number]) Psychiatric this patient is able to make decisions and demonstrates good insight into disease process. Alert and Oriented x 3. pleasant and cooperative. General Notes: Upon inspection patient's wound bed actually showed signs of good epithelization I barely see anything that is still remaining open on the lateral leg on the left I think she will probably be ready for discharge next week with some type of compression stockings. I think a Velcro compression wraps would be of benefit as I do not believe she can get just a standard stocking on. Integumentary (Hair, Skin) Wound #1 status is Open. Original cause of wound was Gradually Appeared. The wound is located on the Left,Posterior Lower Leg. The wound measures 0.3cm length x 0.3cm width x 0.2cm depth; 0.071cm^2 area and 0.014cm^3 volume. There is Fat Layer (Subcutaneous Tissue) exposed. There is a large amount of serous drainage noted. The wound margin is flat and intact. There is medium (34-66%) red granulation within the wound bed. There is a medium (34-66%) amount of necrotic tissue within the wound bed including Adherent Slough. Assessment Active Problems ICD-10 Venous insufficiency (chronic) (peripheral) Non-pressure chronic ulcer of other part of left lower leg with fat layer exposed Essential (primary) hypertension Chronic combined systolic (congestive) and diastolic (congestive) heart failure Malignant neoplasm of unspecified site of unspecified female breast Procedures Wound #1 Pre-procedure diagnosis of Wound #1 is a Venous Leg Ulcer located on the Left,Posterior Lower Leg . There was a Three Layer  Compression Therapy Procedure by Grover Canavan, RN. Post procedure Diagnosis Wound #1: Same as Pre-Procedure Plan Wound Cleansing: Cleanse wound with mild soap and water Anesthetic (add to Medication List): Wound #1 Left,Posterior Lower Leg: Topical Lidocaine 4% cream applied to wound bed prior to debridement (In Clinic Only). Secondary Dressing: Wound #1 Left,Posterior Lower Leg: ABD  pad Dressing Change Frequency: Wound #1 Left,Posterior Lower Leg: Change dressing every week Follow-up Appointments: Wound #1 Left,Posterior Lower Leg: Return Appointment in 1 week. Edema Control: Wound #1 Left,Posterior Lower Leg: 3 Layer Compression System - Left Lower Extremity - TCA to leg to help with itching Elevate legs to the level of the heart and pump ankles as often as possible Other: - Tubi F to right leg Additional Orders / Instructions: Wound #1 Left,Posterior Lower Leg: Vitamin A; Vitamin C, Zinc Increase protein intake. [name], [name] ([id number]) 1. I would recommend currently that we go ahead and initiate treatment with the continuation of the compression wraps which does seem to be doing well for the patient today. 2. I am also can recommend at this time that we continue with the ABD pad to catch anything that may drain but really I think it is fairly healed as far as the left leg is concerned. 3. The wrap we use will be a 3 layer compression wrap to the left and Tubigrip to the right leg. 3. We can order Velcro compression wraps for the patient as well. We will see patient back for reevaluation in 1 week here in the clinic. If anything worsens or changes patient will contact our office for additional recommendations. Electronic Signature(s) Signed: 05/31/2020 5:20:45 PM By: Worthy Keeler PA-C Entered By: Worthy Keeler on 05/31/2020 17:20:44 [name] ([id number]) -------------------------------------------------------------------------------- SuperBill  Details Patient Name: [name] Date of Service: 05/31/2020 Medical Record Number: [id number] Patient Account Number: [id number] Date of Birth/Sex: Nov 22, 1945 (74 y.o. F) Treating RN: Grover Canavan Primary Care Provider: [name] Other Clinician: Referring Provider: [name] Treating Provider/Extender: Melburn Hake, Cana Mignano Weeks in Treatment: 12 Diagnosis Coding ICD-10 Codes Code Description I87.2 Venous insufficiency (chronic) (peripheral) L97.822 Non-pressure chronic ulcer of other part of left lower leg with fat layer exposed I10 Essential (primary) hypertension I50.42 Chronic combined systolic (congestive) and diastolic (congestive) heart failure C50.919 Malignant neoplasm of unspecified site of unspecified female breast Facility Procedures CPT4 Code: [id number] Description: (Facility Use Only) [id number] - [facility] LWR LT LEG Modifier: Quantity: 1 Physician Procedures CPT4 Code: 5643329 Description: 51884 - WC PHYS LEVEL 3 - EST PT Modifier: Quantity: 1 CPT4 Code: Description: ICD-10 Diagnosis Description I87.2 Venous insufficiency (chronic) (peripheral) L97.822 Non-pressure chronic ulcer of other part of left lower leg with fat lay I10 Essential (primary) hypertension I50.42 Chronic combined systolic (congestive)  and diastolic (congestive) heart Modifier: er exposed failure Quantity: Electronic Signature(s) Signed: 05/31/2020 5:21:01 PM By: Worthy Keeler PA-C Previous Signature: 05/31/2020 5:09:50 PM Version By: Grover Canavan Entered By: Worthy Keeler on 05/31/2020 17:21:01

## 2020-06-01 NOTE — Progress Notes (Signed)
MAEBY, VANKLEECK ([id number]) Visit Report for 05/31/2020 Arrival Information Details Patient Name: [name], [name] Date of Service: 05/31/2020 12:30 PM Medical Record Number: [id number] Patient Account Number: [id number] Date of Birth/Sex: 1946/08/31 (74 y.o. F) Treating RN: Grover Canavan Primary Care Renesme Kerrigan: [name] Other Clinician: Referring Kanon Novosel: [name] Treating Vinal Rosengrant/Extender: Melburn Hake, HOYT Weeks in Treatment: 12 Visit Information History Since Last Visit All ordered tests and consults were completed: No Patient Arrived: Wheel Chair Added or deleted any medications: No Arrival Time: 13:26 Any new allergies or adverse reactions: No Accompanied By: grand daughter Had a fall or experienced change in No Transfer Assistance: EasyPivot Patient activities of daily living that may affect Lift risk of falls: Patient Identification Verified: Yes Signs or symptoms of abuse/neglect since last visito No Secondary Verification Process Completed: Yes Hospitalized since last visit: No Patient Requires Transmission-Based No Implantable device outside of the clinic excluding No Precautions: cellular tissue based products placed in the center Patient Has Alerts: No since last visit: Pain Present Now: No Electronic Signature(s) Signed: 05/31/2020 5:16:53 PM By: Lorine Bears RCP, RRT, CHT Previous Signature: 05/31/2020 1:58:13 PM Version By: Darci Needle Entered By: Lorine Bears on 05/31/2020 16:45:14 [name] ([id number]) -------------------------------------------------------------------------------- Compression Therapy Details Patient Name: [name] Date of Service: 05/31/2020 12:30 PM Medical Record Number: [id number] Patient Account Number: [id number] Date of Birth/Sex: [birth year] (74 y.o. F) Treating RN: Grover Canavan Primary Care Reshanda Lewey: [name] Other Clinician: Referring Meridee Branum: [name] Treating Makesha Belitz/Extender: Melburn Hake, HOYT Weeks in Treatment: 12 Compression Therapy Performed for Wound Assessment: Wound #1 Left,Posterior Lower Leg Performed By: Clinician Grover Canavan, RN Compression Type: Three Layer Post Procedure Diagnosis Same as Pre-procedure Electronic Signature(s) Signed: 05/31/2020 5:09:50 PM By: Grover Canavan Entered By: Grover Canavan on 05/31/2020 13:45:12 SADI, ARAVE ([id number]) -------------------------------------------------------------------------------- Encounter Discharge Information Details Patient Name: [name] Date of Service: 05/31/2020 12:30 PM Medical Record Number: [id number] Patient Account Number: [id number] Date of Birth/Sex: 1945/09/25 (74 y.o. F) Treating RN: Grover Canavan Primary Care Elajah Kunsman: [name] Other Clinician: Referring Adrijana Haros: [name] Treating Dalana Pfahler/Extender: Melburn Hake, HOYT Weeks in Treatment: 12 Encounter Discharge Information Items Discharge Condition: Stable Ambulatory Status: Wheelchair Discharge Destination: Home Transportation: Private Auto Accompanied By: self Schedule Follow-up Appointment: Yes Clinical Summary of Care: Electronic Signature(s) Signed: 05/31/2020 5:09:50 PM By: Grover Canavan Entered By: Grover Canavan on 05/31/2020 13:48:02 [name] ([id number]) -------------------------------------------------------------------------------- Lower Extremity Assessment Details Patient Name: [name] Date of Service: 05/31/2020 12:30 PM Medical Record Number: [id number] Patient Account Number: [id number] Date of Birth/Sex: [birth year] (74 y.o. F) Treating RN: Grover Canavan Primary Care Stevey Stapleton: [name] Other Clinician: Referring Garielle Mroz: [name] Treating Amaris Garrette/Extender: Melburn Hake, HOYT Weeks in Treatment: 12 Edema Assessment Assessed: [Left: Yes] [Right: Yes] Edema: [Left: Yes] [Right: Yes] Calf Left: Right: Point of  Measurement: 32 cm From Medial Instep 34 cm 34.2 cm Ankle Left: Right: Point of Measurement: 12 cm From Medial Instep 26 cm 26.5 cm Vascular Assessment Pulses: Dorsalis Pedis Palpable: [Left:Yes] [Right:Yes] Posterior Tibial Palpable: [Left:Yes] [Right:Yes] Electronic Signature(s) Signed: 05/31/2020 5:09:50 PM By: Grover Canavan Signed: 05/31/2020 5:16:53 PM By: Lorine Bears RCP, RRT, CHT Previous Signature: 05/31/2020 1:58:13 PM Version By: Darci Needle Entered By: Lorine Bears on 05/31/2020 16:45:54 [name] ([id number]) -------------------------------------------------------------------------------- Multi Wound Chart Details Patient Name: [name] Date of Service: 05/31/2020 12:30 PM Medical Record Number: [id number] Patient Account Number: [id number] Date of Birth/Sex: [birth year] (74 y.o. F) Treating RN: Grover Canavan Primary Care Coraline Talwar: [name] Other Clinician: Referring  Kynley Metzger: [name] Treating Urijah Arko/Extender: STONE III, HOYT Weeks in Treatment: 12 Vital Signs Height(in): 69 Pulse(bpm): 67 Weight(lbs): 010 Blood Pressure(mmHg): 143/64 Body Mass Index(BMI): 39 Temperature(F): 98 Respiratory Rate(breaths/min): 24 Photos: [N/A:N/A] Wound Location: Left, Posterior Lower Leg N/A N/A Wounding Event: Gradually Appeared N/A N/A Primary Etiology: Venous Leg Ulcer N/A N/A Comorbid History: Cataracts, Congestive Heart Failure, N/A N/A Hypertension, Type II Diabetes, Osteoarthritis, Neuropathy, Received Chemotherapy, Received Radiation Date Acquired: 03/06/2020 N/A N/A Weeks of Treatment: 12 N/A N/A Wound Status: Open N/A N/A Measurements L x W x D (cm) 0.3x0.3x0.2 N/A N/A Area (cm) : 0.071 N/A N/A Volume (cm) : 0.014 N/A N/A % Reduction in Area: 94.20% N/A N/A % Reduction in Volume: 88.60% N/A N/A Classification: Full Thickness Without Exposed N/A N/A Support Structures Exudate Amount: Large N/A  N/A Exudate Type: Serous N/A N/A Exudate Color: amber N/A N/A Wound Margin: Flat and Intact N/A N/A Granulation Amount: Medium (34-66%) N/A N/A Granulation Quality: Red N/A N/A Necrotic Amount: Medium (34-66%) N/A N/A Exposed Structures: Fat Layer (Subcutaneous Tissue): N/A N/A Yes Fascia: No Tendon: No Muscle: No Joint: No Bone: No Epithelialization: Small (1-33%) N/A N/A Treatment Notes Electronic Signature(s) Signed: 05/31/2020 5:09:50 PM By: Grover Canavan Entered By: Grover Canavan on 05/31/2020 13:43:40 [name] ([id number]) FATUMATA, KASHANI ([id number]) -------------------------------------------------------------------------------- Multi-Disciplinary Care Plan Details Patient Name: [name] Date of Service: 05/31/2020 12:30 PM Medical Record Number: [id number] Patient Account Number: [id number] Date of Birth/Sex: 1946-08-27 (74 y.o. F) Treating RN: Grover Canavan Primary Care Labarron Durnin: [name] Other Clinician: Referring Stormey Wilborn: [name] Treating Lavoris Sparling/Extender: Melburn Hake, HOYT Weeks in Treatment: 12 Active Inactive Necrotic Tissue Nursing Diagnoses: Impaired tissue integrity related to necrotic/devitalized tissue Knowledge deficit related to management of necrotic/devitalized tissue Goals: Necrotic/devitalized tissue will be minimized in the wound bed Date Initiated: 03/06/2020 Target Resolution Date: 05/11/2020 Goal Status: Active Patient/caregiver will verbalize understanding of reason and process for debridement of necrotic tissue Date Initiated: 03/06/2020 Date Inactivated: 04/10/2020 Target Resolution Date: 03/06/2020 Goal Status: Met Interventions: Assess patient pain level pre-, during and post procedure and prior to discharge Provide education on necrotic tissue and debridement process Treatment Activities: Apply topical anesthetic as ordered : 03/06/2020 Notes: Pain, Acute or Chronic Nursing Diagnoses: Pain Management -  Non-cyclic Acute (Procedural) Pain, acute or chronic: actual or potential Goals: Patient will verbalize adequate pain control and receive pain control interventions during procedures as needed Date Initiated: 03/06/2020 Target Resolution Date: 05/11/2020 Goal Status: Active Interventions: Reposition patient for comfort Notes: Venous Leg Ulcer Nursing Diagnoses: Potential for venous Insuffiency (use before diagnosis confirmed) Goals: Patient will maintain optimal edema control Date Initiated: 03/06/2020 Target Resolution Date: 05/11/2020 Goal Status: Active Interventions: Assess peripheral edema status every visit. Compression as ordered Treatment Activities: SHAQUANTA, HARKLESS ([id number]) Therapeutic compression applied : 03/06/2020 Notes: Electronic Signature(s) Signed: 05/31/2020 5:09:50 PM By: Grover Canavan Entered By: Grover Canavan on 05/31/2020 13:43:31 [name] ([id number]) -------------------------------------------------------------------------------- Pain Assessment Details Patient Name: [name] Date of Service: 05/31/2020 12:30 PM Medical Record Number: [id number] Patient Account Number: [id number] Date of Birth/Sex: 10-17-1945 (74 y.o. F) Treating RN: Grover Canavan Primary Care Oreatha Fabry: [name] Other Clinician: Referring Syble Picco: [name] Treating Tymeka Privette/Extender: Melburn Hake, HOYT Weeks in Treatment: 12 Active Problems Location of Pain Severity and Description of Pain Patient Has Paino No Site Locations With Dressing Change: No Pain Management and Medication Current Pain Management: Electronic Signature(s) Signed: 05/31/2020 5:09:50 PM By: Grover Canavan Signed: 05/31/2020 5:16:53 PM By: Lorine Bears RCP, RRT, CHT Previous Signature: 05/31/2020  1:58:13 PM Version By: Darci Needle Entered By: Lorine Bears on 05/31/2020 16:45:38 [name]  ([id number]) -------------------------------------------------------------------------------- Patient/Caregiver Education Details Patient Name: [name] Date of Service: 05/31/2020 12:30 PM Medical Record Number: [id number] Patient Account Number: [id number] Date of Birth/Gender: 03-Nov-1945 (74 y.o. F) Treating RN: Grover Canavan Primary Care Physician: [name] Other Clinician: Referring Physician: [name] Treating Physician/Extender: Sharalyn Ink in Treatment: 12 Education Assessment Education Provided To: Patient Education Topics Provided Wound/Skin Impairment: Methods: Explain/Verbal Responses: State content correctly Electronic Signature(s) Signed: 05/31/2020 5:09:50 PM By: Grover Canavan Entered By: Grover Canavan on 05/31/2020 13:43:56 [name] ([id number]) -------------------------------------------------------------------------------- Wound Assessment Details Patient Name: [name] Date of Service: 05/31/2020 12:30 PM Medical Record Number: [id number] Patient Account Number: [id number] Date of Birth/Sex: 01-Nov-1945 (74 y.o. F) Treating RN: Grover Canavan Primary Care Jack Bolio: [name] Other Clinician: Referring Giovanie Lefebre: [name] Treating Asherah Lavoy/Extender: Melburn Hake, HOYT Weeks in Treatment: 12 Wound Status Wound Number: 1 Primary Venous Leg Ulcer Etiology: Wound Location: Left, Posterior Lower Leg Wound Open Wounding Event: Gradually Appeared Status: Date Acquired: 03/06/2020 Comorbid Cataracts, Congestive Heart Failure, Hypertension, Type II Weeks Of Treatment: 12 History: Diabetes, Osteoarthritis, Neuropathy, Received Clustered Wound: No Chemotherapy, Received Radiation Photos Wound Measurements Length: (cm) 0.3 Width: (cm) 0.3 Depth: (cm) 0.2 Area: (cm) 0.071 Volume: (cm) 0.014 % Reduction in Area: 94.2% % Reduction in Volume: 88.6% Epithelialization: Small (1-33%) Wound  Description Classification: Full Thickness Without Exposed Support Structu Wound Margin: Flat and Intact Exudate Amount: Large Exudate Type: Serous Exudate Color: amber res Foul Odor After Cleansing: No Slough/Fibrino Yes Wound Bed Granulation Amount: Medium (34-66%) Exposed Structure Granulation Quality: Red Fascia Exposed: No Necrotic Amount: Medium (34-66%) Fat Layer (Subcutaneous Tissue) Exposed: Yes Necrotic Quality: Adherent Slough Tendon Exposed: No Muscle Exposed: No Joint Exposed: No Bone Exposed: No Treatment Notes Wound #1 (Left, Posterior Lower Leg) Notes tca cream to periwound area, abd, 3 layer left with unna to anchor Electronic Signature(s) ALICEN, DONALSON ([id number]) Signed: 05/31/2020 5:09:50 PM By: Grover Canavan Signed: 05/31/2020 5:16:53 PM By: Lorine Bears RCP, RRT, CHT Previous Signature: 05/31/2020 1:58:13 PM Version By: Darci Needle Entered By: Lorine Bears on 05/31/2020 16:46:13 [name] ([id number]) -------------------------------------------------------------------------------- Vitals Details Patient Name: [name] Date of Service: 05/31/2020 12:30 PM Medical Record Number: [id number] Patient Account Number: [id number] Date of Birth/Sex: 01-01-1946 (74 y.o. F) Treating RN: Grover Canavan Primary Care Oneda Duffett: [name] Other Clinician: Referring Abenezer Odonell: [name] Treating Saroya Riccobono/Extender: Melburn Hake, HOYT Weeks in Treatment: 12 Vital Signs Time Taken: 01:10 Temperature (F): 98 Height (in): 69 Pulse (bpm): 67 Weight (lbs): 263 Respiratory Rate (breaths/min): 24 Body Mass Index (BMI): 38.8 Blood Pressure (mmHg): 143/64 Reference Range: 80 - 120 mg / dl Electronic Signature(s) Signed: 05/31/2020 5:16:53 PM By: Lorine Bears RCP, RRT, CHT Previous Signature: 05/31/2020 1:58:13 PM Version By: Darci Needle Entered By: Lorine Bears on 05/31/2020  16:45:24

## 2020-06-04 ENCOUNTER — Other Ambulatory Visit: Payer: Self-pay

## 2020-06-04 ENCOUNTER — Ambulatory Visit (INDEPENDENT_AMBULATORY_CARE_PROVIDER_SITE_OTHER): Payer: Medicare HMO | Admitting: Nurse Practitioner

## 2020-06-04 DIAGNOSIS — I890 Lymphedema, not elsewhere classified: Secondary | ICD-10-CM

## 2020-06-04 NOTE — Progress Notes (Signed)
History of Present Illness  There is no documented history at this time  Assessments & Plan   There are no diagnoses linked to this encounter.    Additional instructions  Subjective:  Patient presents with venous ulcer of the Bilateral lower extremity.    Procedure:  3 layer unna wrap was placed Bilateral lower extremity.   Plan:   Follow up in one week.  

## 2020-06-05 ENCOUNTER — Encounter (INDEPENDENT_AMBULATORY_CARE_PROVIDER_SITE_OTHER): Payer: Self-pay | Admitting: Nurse Practitioner

## 2020-06-08 ENCOUNTER — Other Ambulatory Visit: Payer: Self-pay

## 2020-06-08 ENCOUNTER — Encounter: Payer: Medicare HMO | Attending: Physician Assistant | Admitting: Physician Assistant

## 2020-06-08 DIAGNOSIS — I5042 Chronic combined systolic (congestive) and diastolic (congestive) heart failure: Secondary | ICD-10-CM | POA: Insufficient documentation

## 2020-06-08 DIAGNOSIS — L97829 Non-pressure chronic ulcer of other part of left lower leg with unspecified severity: Secondary | ICD-10-CM | POA: Diagnosis present

## 2020-06-08 DIAGNOSIS — Z6841 Body Mass Index (BMI) 40.0 and over, adult: Secondary | ICD-10-CM | POA: Diagnosis not present

## 2020-06-08 DIAGNOSIS — L97822 Non-pressure chronic ulcer of other part of left lower leg with fat layer exposed: Secondary | ICD-10-CM | POA: Insufficient documentation

## 2020-06-08 DIAGNOSIS — I872 Venous insufficiency (chronic) (peripheral): Secondary | ICD-10-CM | POA: Insufficient documentation

## 2020-06-08 DIAGNOSIS — I110 Hypertensive heart disease with heart failure: Secondary | ICD-10-CM | POA: Diagnosis not present

## 2020-06-08 DIAGNOSIS — E669 Obesity, unspecified: Secondary | ICD-10-CM | POA: Insufficient documentation

## 2020-06-08 DIAGNOSIS — Z853 Personal history of malignant neoplasm of breast: Secondary | ICD-10-CM | POA: Insufficient documentation

## 2020-06-09 NOTE — Progress Notes (Signed)
[name], [name] ([id number]) Visit Report for 06/08/2020 Chief Complaint Document Details Patient Name: SATINA, JERRELL Date of Service: 06/08/2020 2:30 PM Medical Record Number: [id number] Patient Account Number: [id number] Date of Birth/Sex: Nov 16, 1945 (74 y.o. F) Treating RN: Grover Canavan Primary Care Provider: [name] Other Clinician: Referring Provider: [name] Treating Provider/Extender: Worthy Keeler Weeks in Treatment: 13 Information Obtained from: Patient Chief Complaint Left LE Ulcer Electronic Signature(s) Signed: 06/08/2020 5:30:29 PM By: Worthy Keeler PA-C Entered By: Worthy Keeler on 06/08/2020 17:30:29 [name] ([id number]) -------------------------------------------------------------------------------- HPI Details Patient Name: [name] Date of Service: 06/08/2020 2:30 PM Medical Record Number: [id number] Patient Account Number: [id number] Date of Birth/Sex: [birth year] (74 y.o. F) Treating RN: Grover Canavan Primary Care Provider: [name] Other Clinician: Referring Provider: [name] Treating Provider/Extender: Melburn Hake, Nikeya Maxim Weeks in Treatment: 13 History of Present Illness HPI Description: 03/15/2020 upon evaluation today patient presents today for initial evaluation here in our clinic concerning a wound that is on the left posterior lower extremity. Unfortunately this has been giving the patient some discomfort at this point which she notes has been affected her pretty much daily. The patient does have a history of venous insufficiency, hypertension, congestive heart failure, and a history of breast cancer. Fortunately there does not appear to be evidence of active infection at this time which is great news. No fevers, chills, nausea, vomiting, or diarrhea. Wound is not extremely large but does have some slough covering the surface of the wound. This is going require sharp debridement today. 03/15/2020 upon evaluation  today patient appears to be doing fairly well in regard to her wound. She does have some slough noted on the surface of the wound currently but I do believe the Iodoflex has been beneficial over the past week. There is no signs of active infection at this time which is great news and overall very pleased with the progress. No fevers, chills, nausea, vomiting, or diarrhea. 04/02/2020 upon evaluation today patient appears to be doing a little better in regard to her wound size wise this is not tremendously smaller she does have some slough buildup on the surface of the wound. With that being said this is going require some sharp debridement today to clear away some of the necrotic debris. Fortunately there is no evidence of active infection at this time. No fevers, chills, nausea, vomiting, or diarrhea. 04/10/2020 on evaluation today patient appears to be doing well with regard to her ulcer which is measuring somewhat smaller today. She actually has more granulation tissue noted which is also good news is no need for sharp debridement today. Fortunately there is no evidence of infection either which is also excellent. No fevers, chills, nausea, vomiting, or diarrhea. 04/26/2020 on evaluation today patient's wound actually is showing signs of good improvement which is great news. There does not appear to be any evidence of active infection. I do believe she is tolerating the collagen at this point. 05/03/2020 upon evaluation today patient's wound actually showed signs of good granulation at this time there does not appear to be any evidence of active infection which is great news and overall very pleased with where things stand. With that being said I do believe that the patient is can require some sharp debridement today but fortunately nothing too significant. 05/11/20 on evaluation today patient appears to be doing well in regard to her leg ulcer. She's been tolerating the dressing changes  without complication. Fortunately there is no signs of active infection at  this time. Overall I'm very pleased with where things stand. 05/18/2020 upon evaluation today patient's wound is showing signs of improvement is very dry and I think the collagen for the most part just dried to the wound bed. I am going to clean this away with sharp debridement in order to get this under control in my opinion. 05/25/2020 upon evaluation today patient actually appears to be doing well in regard to her leg ulcer. In fact upon inspection it appears she could potentially be completely healed but that is not guaranteed based on what we are seeing. There does not appear to be any signs of active infection which is great news. 05/31/2020 on evaluation today patient appears to be doing well with regard to her wounds. She in fact appears to be almost completely healed on the left leg there is just a very small opening remaining point 1 06/08/2020 upon evaluation today patient appears to be doing excellent in regard to her leg ulcer on the left in fact it appears to be that she is completely healed as of today. I see no signs of active infection at this time which is great news. No fevers, chills, nausea, vomiting, or diarrhea. Electronic Signature(s) Signed: 06/08/2020 5:30:37 PM By: Worthy Keeler PA-C Entered By: Worthy Keeler on 06/08/2020 17:30:37 [name] ([id number]) -------------------------------------------------------------------------------- Physical Exam Details Patient Name: [name] Date of Service: 06/08/2020 2:30 PM Medical Record Number: [id number] Patient Account Number: [id number] Date of Birth/Sex: 07-Nov-1945 (74 y.o. F) Treating RN: Grover Canavan Primary Care Provider: [name] Other Clinician: Referring Provider: [name] Treating Provider/Extender: Melburn Hake, Abbey Veith Weeks in Treatment: 13 Constitutional Obese and well-hydrated in no acute  distress. Respiratory normal breathing without difficulty. Psychiatric this patient is able to make decisions and demonstrates good insight into disease process. Alert and Oriented x 3. pleasant and cooperative. Notes Upon inspection patient's wound actually showed signs of good granulation and epithelization at this time in fact I do not see anything that seems to be remaining open which is great news. Overall I think she is ready for discharge she does need to have ongoing compression we have ordered Velcro compression wraps but she is going to need to get these sent to her that means she is going to pay for it unfortunately her insurance would not. Electronic Signature(s) Signed: 06/08/2020 5:36:54 PM By: Worthy Keeler PA-C Previous Signature: 06/08/2020 5:31:09 PM Version By: Worthy Keeler PA-C Entered By: Worthy Keeler on 06/08/2020 17:36:54 [name] ([id number]) -------------------------------------------------------------------------------- Physician Orders Details Patient Name: [name] Date of Service: 06/08/2020 2:30 PM Medical Record Number: [id number] Patient Account Number: [id number] Date of Birth/Sex: 1946-02-14 (74 y.o. F) Treating RN: Grover Canavan Primary Care Provider: [name] Other Clinician: Referring Provider: [name] Treating Provider/Extender: Melburn Hake, Lenox Bink Weeks in Treatment: 13 Verbal / Phone Orders: No Diagnosis Coding Discharge From [facility] o Discharge from [place] Notes double tubi F each leg. get velco wraps for both legs Electronic Signature(s) Signed: 06/08/2020 4:08:44 PM By: Grover Canavan Signed: 06/08/2020 6:04:02 PM By: Worthy Keeler PA-C Entered By: Grover Canavan on 06/08/2020 15:02:18 [name] ([id number]) -------------------------------------------------------------------------------- Problem List Details Patient Name: [name] Date of Service: 06/08/2020 2:30  PM Medical Record Number: [id number] Patient Account Number: [id number] Date of Birth/Sex: 01-Sep-1946 (74 y.o. F) Treating RN: Grover Canavan Primary Care Provider: [name] Other Clinician: Referring Provider: [name] Treating Provider/Extender: Melburn Hake, Dedra Matsuo Weeks in Treatment: 13 Active Problems ICD-10 Encounter Code  Description Active Date MDM Diagnosis I87.2 Venous insufficiency (chronic) (peripheral) 03/06/2020 No Yes L97.822 Non-pressure chronic ulcer of other part of left lower leg with fat layer 03/06/2020 No Yes exposed I10 Essential (primary) hypertension 03/06/2020 No Yes I50.42 Chronic combined systolic (congestive) and diastolic (congestive) heart 03/06/2020 No Yes failure C50.919 Malignant neoplasm of unspecified site of unspecified female breast 03/06/2020 No Yes Inactive Problems Resolved Problems Electronic Signature(s) Signed: 06/08/2020 5:28:12 PM By: Worthy Keeler PA-C Entered By: Worthy Keeler on 06/08/2020 17:28:11 [name] ([id number]) -------------------------------------------------------------------------------- Progress Note Details Patient Name: [name] Date of Service: 06/08/2020 2:30 PM Medical Record Number: [id number] Patient Account Number: [id number] Date of Birth/Sex: August 17, 1946 (74 y.o. F) Treating RN: Grover Canavan Primary Care Provider: [name] Other Clinician: Referring Provider: [name] Treating Provider/Extender: Melburn Hake, Kainan Patty Weeks in Treatment: 13 Subjective Chief Complaint Information obtained from Patient Left LE Ulcer History of Present Illness (HPI) 03/15/2020 upon evaluation today patient presents today for initial evaluation here in our clinic concerning a wound that is on the left posterior lower extremity. Unfortunately this has been giving the patient some discomfort at this point which she notes has been affected her pretty much daily. The patient does have a history of venous  insufficiency, hypertension, congestive heart failure, and a history of breast cancer. Fortunately there does not appear to be evidence of active infection at this time which is great news. No fevers, chills, nausea, vomiting, or diarrhea. Wound is not extremely large but does have some slough covering the surface of the wound. This is going require sharp debridement today. 03/15/2020 upon evaluation today patient appears to be doing fairly well in regard to her wound. She does have some slough noted on the surface of the wound currently but I do believe the Iodoflex has been beneficial over the past week. There is no signs of active infection at this time which is great news and overall very pleased with the progress. No fevers, chills, nausea, vomiting, or diarrhea. 04/02/2020 upon evaluation today patient appears to be doing a little better in regard to her wound size wise this is not tremendously smaller she does have some slough buildup on the surface of the wound. With that being said this is going require some sharp debridement today to clear away some of the necrotic debris. Fortunately there is no evidence of active infection at this time. No fevers, chills, nausea, vomiting, or diarrhea. 04/10/2020 on evaluation today patient appears to be doing well with regard to her ulcer which is measuring somewhat smaller today. She actually has more granulation tissue noted which is also good news is no need for sharp debridement today. Fortunately there is no evidence of infection either which is also excellent. No fevers, chills, nausea, vomiting, or diarrhea. 04/26/2020 on evaluation today patient's wound actually is showing signs of good improvement which is great news. There does not appear to be any evidence of active infection. I do believe she is tolerating the collagen at this point. 05/03/2020 upon evaluation today patient's wound actually showed signs of good granulation at this time there does not  appear to be any evidence of active infection which is great news and overall very pleased with where things stand. With that being said I do believe that the patient is can require some sharp debridement today but fortunately nothing too significant. 05/11/20 on evaluation today patient appears to be doing well in regard to her leg ulcer. She's been tolerating the dressing changes without complication.  Fortunately there is no signs of active infection at this time. Overall I'm very pleased with where things stand. 05/18/2020 upon evaluation today patient's wound is showing signs of improvement is very dry and I think the collagen for the most part just dried to the wound bed. I am going to clean this away with sharp debridement in order to get this under control in my opinion. 05/25/2020 upon evaluation today patient actually appears to be doing well in regard to her leg ulcer. In fact upon inspection it appears she could potentially be completely healed but that is not guaranteed based on what we are seeing. There does not appear to be any signs of active infection which is great news. 05/31/2020 on evaluation today patient appears to be doing well with regard to her wounds. She in fact appears to be almost completely healed on the left leg there is just a very small opening remaining point 1 06/08/2020 upon evaluation today patient appears to be doing excellent in regard to her leg ulcer on the left in fact it appears to be that she is completely healed as of today. I see no signs of active infection at this time which is great news. No fevers, chills, nausea, vomiting, or diarrhea. Objective Constitutional Obese and well-hydrated in no acute distress. Vitals Time Taken: 2:37 PM, Height: 69 in, Weight: 263 lbs, BMI: 38.8, Temperature: 98.5 F, Pulse: 60 bpm, Respiratory Rate: 18 breaths/min, Blood Pressure: 149/73 mmHg. [place], [place] ([id number]) Respiratory normal breathing without  difficulty. Psychiatric this patient is able to make decisions and demonstrates good insight into disease process. Alert and Oriented x 3. pleasant and cooperative. General Notes: Upon inspection patient's wound actually showed signs of good granulation and epithelization at this time in fact I do not see anything that seems to be remaining open which is great news. Overall I think she is ready for discharge she does need to have ongoing compression we have ordered Velcro compression wraps but she is going to need to get these sent to her that means she is going to pay for it unfortunately her insurance would not. Integumentary (Hair, Skin) Wound #1 status is Healed - Epithelialized. Original cause of wound was Gradually Appeared. The wound is located on the Left,Posterior Lower Leg. The wound measures 0cm length x 0cm width x 0cm depth; 0cm^2 area and 0cm^3 volume. There is Fat Layer (Subcutaneous Tissue) exposed. There is a large amount of serous drainage noted. The wound margin is flat and intact. There is medium (34-66%) red granulation within the wound bed. There is a medium (34-66%) amount of necrotic tissue within the wound bed including Adherent Slough. Assessment Active Problems ICD-10 Venous insufficiency (chronic) (peripheral) Non-pressure chronic ulcer of other part of left lower leg with fat layer exposed Essential (primary) hypertension Chronic combined systolic (congestive) and diastolic (congestive) heart failure Malignant neoplasm of unspecified site of unspecified female breast Plan Discharge From [facility]: Discharge from [place] [facility] Notes: double tubi F each leg. get velco wraps for both legs 1. Would recommend that we discontinue wound care services at this point as the patient no longer seems to have anything open at this time. Therefore I think she is ready for discharge she just needs a compression for use at home and again for now we will get a bit  Tubigrip on her but she really needs to get her Velcro compression wraps ASAP. 2. Also recommend the patient needs to elevate her legs much as possible  to keep edema under control. I think that still of utmost importance. At this point we will see the patient back for follow-up visit as needed. Electronic Signature(s) Signed: 06/08/2020 5:41:00 PM By: Worthy Keeler PA-C Entered By: Worthy Keeler on 06/08/2020 17:41:00 [name] ([id number]) -------------------------------------------------------------------------------- SuperBill Details Patient Name: [name] Date of Service: 06/08/2020 Medical Record Number: [id number] Patient Account Number: [id number] Date of Birth/Sex: September 02, 1946 (74 y.o. F) Treating RN: Grover Canavan Primary Care Provider: [name] Other Clinician: Referring Provider: [name] Treating Provider/Extender: Melburn Hake, Winnona Wargo Weeks in Treatment: 13 Diagnosis Coding ICD-10 Codes Code Description I87.2 Venous insufficiency (chronic) (peripheral) L97.822 Non-pressure chronic ulcer of other part of left lower leg with fat layer exposed I10 Essential (primary) hypertension I50.42 Chronic combined systolic (congestive) and diastolic (congestive) heart failure C50.919 Malignant neoplasm of unspecified site of unspecified female breast Physician Procedures CPT4 Code: [id number] Description: 99213 - WC PHYS LEVEL 3 - EST PT Modifier: Quantity: 1 CPT4 Code: Description: ICD-10 Diagnosis Description I87.2 Venous insufficiency (chronic) (peripheral) L97.822 Non-pressure chronic ulcer of other part of left lower leg with fat lay I10 Essential (primary) hypertension I50.42 Chronic combined systolic (congestive)  and diastolic (congestive) heart Modifier: er exposed failure Quantity: Electronic Signature(s) Signed: 06/08/2020 5:48:10 PM By: Worthy Keeler PA-C Entered By: Worthy Keeler on 06/08/2020 17:48:10

## 2020-06-11 ENCOUNTER — Encounter (INDEPENDENT_AMBULATORY_CARE_PROVIDER_SITE_OTHER): Payer: Medicare HMO

## 2020-06-11 ENCOUNTER — Telehealth (INDEPENDENT_AMBULATORY_CARE_PROVIDER_SITE_OTHER): Payer: Self-pay

## 2020-06-11 NOTE — Telephone Encounter (Signed)
[name] with [facility] home health left a message wanted to make Dr Delana Meyer aware that the patient and daughter requested for in home nursing care to start on October 12. The nurse did ask for earlier dates but the daughter wanted to get all the doctor appointments out the way before in home health started.

## 2020-06-12 ENCOUNTER — Other Ambulatory Visit: Payer: Self-pay

## 2020-06-12 ENCOUNTER — Ambulatory Visit (INDEPENDENT_AMBULATORY_CARE_PROVIDER_SITE_OTHER): Payer: Medicare HMO | Admitting: Nurse Practitioner

## 2020-06-12 ENCOUNTER — Encounter (INDEPENDENT_AMBULATORY_CARE_PROVIDER_SITE_OTHER): Payer: Self-pay | Admitting: Nurse Practitioner

## 2020-06-12 DIAGNOSIS — I890 Lymphedema, not elsewhere classified: Secondary | ICD-10-CM | POA: Diagnosis not present

## 2020-06-12 NOTE — Progress Notes (Signed)
History of Present Illness  There is no documented history at this time  Assessments & Plan   There are no diagnoses linked to this encounter.    Additional instructions  Subjective:  Patient presents with venous ulcer of the Bilateral lower extremity.    Procedure:  3 layer unna wrap was placed Bilateral lower extremity.   Plan:   Follow up in one week.  

## 2020-06-18 ENCOUNTER — Encounter: Payer: Self-pay | Admitting: Surgery

## 2020-06-18 ENCOUNTER — Ambulatory Visit (INDEPENDENT_AMBULATORY_CARE_PROVIDER_SITE_OTHER): Payer: Medicare HMO | Admitting: Surgery

## 2020-06-18 ENCOUNTER — Other Ambulatory Visit: Payer: Self-pay

## 2020-06-18 ENCOUNTER — Encounter (INDEPENDENT_AMBULATORY_CARE_PROVIDER_SITE_OTHER): Payer: Medicare HMO

## 2020-06-18 VITALS — BP 140/80 | HR 89 | Temp 98.0°F

## 2020-06-18 DIAGNOSIS — C50411 Malignant neoplasm of upper-outer quadrant of right female breast: Secondary | ICD-10-CM

## 2020-06-18 DIAGNOSIS — Z170 Estrogen receptor positive status [ER+]: Secondary | ICD-10-CM | POA: Diagnosis not present

## 2020-06-18 NOTE — Progress Notes (Signed)
06/18/2020  History of Present Illness: [name] is a 74 y.o. female with a history of left breast cancer s/p lumpectomy and SLNBx in 2006.  She's also more recently s/p right breast lumpectomy and SLNBx in 06/2018 for right breast cancer.  She completed adjuvant radiation and is currently on Arimidex.  She had a mammogram on 05/23/20 which showed stable bilateral surgical changes with no new findings.  She denies any issues with either breast.  However, she has been dealing with a leg ulcer that's not healing and had Unna boots placed recently.  Past Medical History: Past Medical History:  Diagnosis Date  . Abnormality of gait   . 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   . Breast cancer ([place]) 2019   right breast  . CHF (congestive heart failure) ([place])    March 28, 2017 echo: EF 50-55%; 1) echo 07/2011: EF 20-25%, mild concentric hypertrophy, diffuse HK, regional WMA cannot be excluded, grade 1 DD, mitral valve with mild regurg, LA mildly dilated). 2) EF 40-45%, mild AS,                    . Complication of anesthesia 2006   pt states "hard to wake up" after L breast surgery   . Edema   . GERD (gastroesophageal reflux disease)   . Headache   . Heart murmur   . Hypertension   . Hypokinesis    global, EF 35-45 % from Echo.  . Malignant neoplasm of breast (female), unspecified site 06/14/2018   RIGHT T1b, N0; ER/PR positive, HER-2 negative.  . Neuropathy   . Obesity   . Pain in joint, site unspecified   . Personal history of chemotherapy 2006   left breast  . Personal history of radiation therapy 2019   right breast  . Personal history of radiation therapy 2006   left breast  . Pneumonia   . Sleep apnea   . Tinea pedis   . Unspecified hearing loss    bilateral     Past Surgical History: Past Surgical History:  Procedure Laterality Date  . BREAST  BIOPSY Right 2019   11 oc-INVASIVE MAMMARY CARCINOMA, NO SPECIAL TYPE  . BREAST BIOPSY Right 2019   1030 oc- neg  . BREAST LUMPECTOMY  2006   left breast   . CATARACT EXTRACTION     left eye  . HERNIA REPAIR  05/25/2006   Incarcerated omentum in ventral hernia, Ventralight mesh, combined lap/ open with omental resection.   Marland Kitchen PARTIAL MASTECTOMY WITH NEEDLE LOCALIZATION Right 06/14/2018   Procedure: PARTIAL MASTECTOMY WITH NEEDLE LOCALIZATION;  Surgeon: Robert Bellow, MD;  Location: [facility] ORS;  Service: General;  Laterality: Right;  . PORT-A-CATH REMOVAL  05/25/2006  . SENTINEL NODE BIOPSY Right 06/14/2018   Procedure: SENTINEL NODE BIOPSY;  Surgeon: Robert Bellow, MD;  Location: [facility] ORS;  Service: General;  Laterality: Right;  . TUBAL LIGATION      Home Medications: Prior to Admission medications   Medication Sig Start Date End Date Taking? Authorizing Provider  anastrozole (ARIMIDEX) 1 MG tablet TAKE 1 TABLET BY MOUTH DAILY. PATIENT MUST MAKE AN APPT TO SEE DR RAO 05/17/20  Yes Sindy Guadeloupe, MD  aspirin EC 81 MG tablet Take 81 mg by mouth daily.     Yes [provider]  cloNIDine (CATAPRES) 0.2 MG tablet Take 1 tablet (0.2 mg total) by  mouth 2 (two) times daily. 06/27/16  Yes Gollan, Kathlene November, MD  clotrimazole (LOTRIMIN) 1 % cream Apply 1 application topically 2 (two) times daily.  02/18/18  Yes [provider]  [employer] CALCIUM 600 & VITAMIN D3 600-800 MG-UNIT TABS TAKE 1 TABLET BY MOUTH TWICE A DAY 06/21/19  Yes Sindy Guadeloupe, MD  diclofenac sodium (VOLTAREN) 1 % GEL Apply 4 g topically 4 (four) times daily. 06/22/19  Yes Delman Kitten, MD  diclofenac Sodium (VOLTAREN) 1 % GEL Apply 1 application topically as needed. 04/27/20  Yes [provider]  Incontinence Supply Disposable (BLADDER CONTROL PADS EX ABSORB) [place]  03/22/18  Yes [provider]  loratadine (CLARITIN) 10 MG tablet Take 10 mg by mouth daily. 07/20/19  Yes [provider]   losartan (COZAAR) 25 MG tablet Take 25 mg by mouth daily.  05/11/18  Yes [provider]  methocarbamol (ROBAXIN) 500 MG tablet Take 500 mg by mouth 2 (two) times daily as needed. 05/02/20  Yes [provider]  omeprazole (PRILOSEC) 20 MG capsule Take 20 mg by mouth daily.   Yes [provider]  oxybutynin (DITROPAN) 5 MG tablet Take 5 mg by mouth 2 (two) times daily.   Yes [provider]  potassium chloride SA (K-DUR) 20 MEQ tablet Take 1 tablet (20 meq) by mouth once daily   Yes [provider]  tiZANidine (ZANAFLEX) 2 MG tablet 1 TAB BY MOUTH 3 TIMES DAILY AS NEEDED MUSCLE SPASM 02/22/19  Yes [provider]  torsemide (DEMADEX) 20 MG tablet TAKE 1 TABLET BY MOUTH TWICE A DAY 12/06/19  Yes Gollan, Kathlene November, MD  triamcinolone cream (KENALOG) 0.1 % Apply 1 application topically 2 (two) times daily. Back of legs 03/21/19  Yes [provider]  isosorbide mononitrate (IMDUR) 60 MG 24 hr tablet Take 1 tablet (60 mg total) by mouth 2 (two) times daily. 04/20/13 02/10/20  Minna Merritts, MD  anastrozole (ARIMIDEX) 1 MG tablet Take 1 tablet (1 mg total) by mouth daily. Patient MUST make an appointment to see Dr Janese Banks before prescription will be filled again 07/06/19   Sindy Guadeloupe, MD    Allergies: No Known Allergies  Review of Systems: Review of Systems  Constitutional: Negative for chills and fever.  Respiratory: Negative for shortness of breath.   Cardiovascular: Negative for chest pain.  Gastrointestinal: Negative for abdominal pain, nausea and vomiting.  Skin:       Leg ulcer    Physical Exam BP 140/80   Pulse 89   Temp 98 F (36.7 C)  CONSTITUTIONAL: No acute distress HEENT:  Normocephalic, atraumatic, extraocular motion intact. RESPIRATORY:  Lungs are clear, and breath sounds are equal bilaterally. Normal respiratory effort without pathologic use of accessory muscles. CARDIOVASCULAR: Heart is regular without murmurs,  gallops, or rubs. BREAST:  Left breast s/p lumpectomy.  Scar well healed.  No palpable new masses, skin changes, or nipple changes.  Left axilla s/p SLNBx, with scar well healed.  No left axillary or supraclavicular lymphadenopathy.  Right breast s/p lumpectomy with scar well healed.  No palpable new masses, skin changes, or nipple changes.  Right axilla s/p SLNBx with scar well healed.  No right axillary or supraclavicular lymphadenopathy. NEUROLOGIC:  Motor and sensation is grossly normal.  Cranial nerves are grossly intact. PSYCH:  Alert and oriented to person, place and time. Affect is normal.  Labs/Imaging: Mammogram 05/23/20: FINDINGS: There is density and architectural distortion within the RIGHT breast, consistent with postsurgical  changes. These are stable in comparison to prior. There is density and architectural distortion within the LEFT breast, consistent with postsurgical changes. These are stable in comparison to prior. No new suspicious finding bilaterally.  Mammographic images were processed with CAD.  IMPRESSION: No mammographic evidence of malignancy.  RECOMMENDATION: Diagnostic mammogram is suggested in 1 year.  Assessment and Plan: This is a 74 y.o. female s/p left breast lumpectomy in 2006 and right breast lumpectomy in 06/2018.   --Discussed with the patient that she's now two years out from her last surgery, and doing well from breast standpoint.  Mammogram was negative for new findings and her exam is reassuring.   --Follow up 1 year with bilateral diagnostic mammogram.  Face-to-face time spent with the patient and care providers was 15 minutes, with more than 50% of the time spent counseling, educating, and coordinating care of the patient.     Melvyn Neth, [facility]

## 2020-06-18 NOTE — Patient Instructions (Addendum)
The patient has been asked to return to the office in one year with a bilateral diagnostic mammogram. Continue self breast exams. Call office for any new breast issues or concerns.    Breast Self-Awareness Breast self-awareness is knowing how your breasts look and feel. Doing breast self-awareness is important. It allows you to catch a breast problem early while it is still small and can be treated. All women should do breast self-awareness, including women who have had breast implants. Tell your doctor if you notice a change in your breasts. What you need:  A mirror.  A well-lit room. How to do a breast self-exam A breast self-exam is one way to learn what is normal for your breasts and to check for changes. To do a breast self-exam: Look for changes  1. Take off all the clothes above your waist. 2. Stand in front of a mirror in a room with good lighting. 3. Put your hands on your hips. 4. Push your hands down. 5. Look at your breasts and nipples in the mirror to see if one breast or nipple looks different from the other. Check to see if: ? The shape of one breast is different. ? The size of one breast is different. ? There are wrinkles, dips, and bumps in one breast and not the other. 6. Look at each breast for changes in the skin, such as: ? Redness. ? Scaly areas. 7. Look for changes in your nipples, such as: ? Liquid around the nipples. ? Bleeding. ? Dimpling. ? Redness. ? A change in where the nipples are. Feel for changes  1. Lie on your back on the floor. 2. Feel each breast. To do this, follow these steps: ? Pick a breast to feel. ? Put the arm closest to that breast above your head. ? Use your other arm to feel the nipple area of your breast. Feel the area with the pads of your three middle fingers by making small circles with your fingers. For the first circle, press lightly. For the second circle, press harder. For the third circle, press even harder. ? Keep making  circles with your fingers at the different pressures as you move down your breast. Stop when you feel your ribs. ? Move your fingers a little toward the center of your body. ? Start making circles with your fingers again, this time going up until you reach your collarbone. ? Keep making up-and-down circles until you reach your armpit. Remember to keep using the three pressures. ? Feel the other breast in the same way. 3. Sit or stand in the tub or shower. 4. With soapy water on your skin, feel each breast the same way you did in step 2 when you were lying on the floor. Write down what you find Writing down what you find can help you remember what to tell your doctor. Write down:  What is normal for each breast.  Any changes you find in each breast, including: ? The kind of changes you find. ? Whether you have pain. ? Size and location of any lumps.  When you last had your menstrual period. General tips  Check your breasts every month.  If you are breastfeeding, the best time to check your breasts is after you feed your baby or after you use a breast pump.  If you get menstrual periods, the best time to check your breasts is 5-7 days after your menstrual period is over.  With time, you will become comfortable  with the self-exam, and you will begin to know if there are changes in your breasts. Contact a doctor if you:  See a change in the shape or size of your breasts or nipples.  See a change in the skin of your breast or nipples, such as red or scaly skin.  Have fluid coming from your nipples that is not normal.  Find a lump or thick area that was not there before.  Have pain in your breasts.  Have any concerns about your breast health. Summary  Breast self-awareness includes looking for changes in your breasts, as well as feeling for changes within your breasts.  Breast self-awareness should be done in front of a mirror in a well-lit room.  You should check your breasts  every month. If you get menstrual periods, the best time to check your breasts is 5-7 days after your menstrual period is over.  Let your doctor know of any changes you see in your breasts, including changes in size, changes on the skin, pain or tenderness, or fluid from your nipples that is not normal. This information is not intended to replace advice given to you by your health care provider. Make sure you discuss any questions you have with your health care provider. Document Revised: 04/13/2018 Document Reviewed: 04/13/2018 Elsevier Patient Education  [place].

## 2020-06-20 ENCOUNTER — Telehealth (INDEPENDENT_AMBULATORY_CARE_PROVIDER_SITE_OTHER): Payer: Self-pay

## 2020-06-20 NOTE — Telephone Encounter (Signed)
Pt  Called and left a message on the nurses line saying home health came out but was unable to wrap her legs because they needed they orders I returned the pt's call after speaking with the clinical lead and made her aware that we will fax over the orders.

## 2020-06-25 ENCOUNTER — Telehealth (INDEPENDENT_AMBULATORY_CARE_PROVIDER_SITE_OTHER): Payer: Self-pay

## 2020-06-25 ENCOUNTER — Other Ambulatory Visit: Payer: Self-pay

## 2020-06-25 ENCOUNTER — Ambulatory Visit (INDEPENDENT_AMBULATORY_CARE_PROVIDER_SITE_OTHER): Payer: Medicare HMO

## 2020-06-25 ENCOUNTER — Encounter (INDEPENDENT_AMBULATORY_CARE_PROVIDER_SITE_OTHER): Payer: Self-pay | Admitting: Nurse Practitioner

## 2020-06-25 ENCOUNTER — Ambulatory Visit (INDEPENDENT_AMBULATORY_CARE_PROVIDER_SITE_OTHER): Payer: Medicare HMO | Admitting: Nurse Practitioner

## 2020-06-25 VITALS — BP 198/97 | HR 57 | Ht 69.0 in | Wt 250.0 lb

## 2020-06-25 DIAGNOSIS — I890 Lymphedema, not elsewhere classified: Secondary | ICD-10-CM

## 2020-06-25 DIAGNOSIS — I872 Venous insufficiency (chronic) (peripheral): Secondary | ICD-10-CM

## 2020-06-25 DIAGNOSIS — I739 Peripheral vascular disease, unspecified: Secondary | ICD-10-CM | POA: Diagnosis not present

## 2020-06-25 DIAGNOSIS — I10 Essential (primary) hypertension: Secondary | ICD-10-CM | POA: Diagnosis not present

## 2020-06-25 NOTE — Telephone Encounter (Signed)
[name] with [facility] left a voicemail stating that the patient requested for home health to start on Wednesday due to having appointments on Monday and Tuesday.

## 2020-07-01 ENCOUNTER — Encounter (INDEPENDENT_AMBULATORY_CARE_PROVIDER_SITE_OTHER): Payer: Self-pay | Admitting: Nurse Practitioner

## 2020-07-01 NOTE — Progress Notes (Signed)
Subjective:    Patient ID: [name], female    DOB: 30-Dec-1945, 74 y.o.   MRN: [id number] Chief Complaint  Patient presents with  . Follow-up    4 week bil unna boot check an U/S    The patient returns to the office for followup evaluation regarding leg swelling.  The swelling has improved quite a bit and the pain associated with swelling has decreased substantially.  Previous wounds and ulcerations have healed at this time.  The patient was previously on oral wraps and tolerated these well.  Since the previous visit the patient has been wearing graduated compression stockings and has noted  improvement in the lymphedema. The patient has been using compression routinely morning until night.  The patient also states elevation during the day and exercise is being done too.   Today noninvasive studies show noncompressible ABIs bilaterally however she has strong triphasic tibial artery waveforms with good toe waveforms bilaterally.  The patient also underwent a bilateral lower extremity venous reflux study which revealed no evidence of DVT or superficial thrombophlebitis.  No evidence of deep venous insufficiency or superficial venous reflux seen bilaterally.   Review of Systems     Objective:   Physical Exam  BP (!) 198/97   Pulse (!) 57   Ht 5' 9"  (1.753 m)   Wt 250 lb (113.4 kg)   BMI 36.92 kg/m   Past Medical History:  Diagnosis Date  . Abnormality of gait   . Breast cancer ([place]) 02/05/2005   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   . Breast cancer ([place]) 2019   right breast  . CHF (congestive heart failure) ([place])    March 28, 2017 echo: EF 50-55%; 1) echo 07/2011: EF 20-25%, mild concentric hypertrophy, diffuse HK, regional WMA cannot be excluded, grade 1 DD, mitral valve with mild regurg, LA mildly dilated). 2) EF 40-45%, mild AS,                    . Complication of anesthesia 2006    pt states "hard to wake up" after L breast surgery   . Edema   . GERD (gastroesophageal reflux disease)   . Headache   . Heart murmur   . Hypertension   . Hypokinesis    global, EF 35-45 % from Echo.  . Malignant neoplasm of breast (female), unspecified site 06/14/2018   RIGHT T1b, N0; ER/PR positive, HER-2 negative.  . Neuropathy   . Obesity   . Pain in joint, site unspecified   . Personal history of chemotherapy 2006   left breast  . Personal history of radiation therapy 2019   right breast  . Personal history of radiation therapy 2006   left breast  . Pneumonia   . Sleep apnea   . Tinea pedis   . Unspecified hearing loss    bilateral    Social History   Socioeconomic History  . Marital status: Divorced    Spouse name: Not on file  . Number of children: Not on file  . Years of education: Not on file  . Highest education level: Not on file  Occupational History  . Occupation: retired  Tobacco Use  . Smoking status: Former Smoker    Packs/day: 0.50    Years: 30.00    Pack years: 15.00    Types: Cigarettes    Quit date: 07/14/2000    Years since quitting: 19.9  .  Smokeless tobacco: Never Used  Vaping Use  . Vaping Use: Never used  Substance and Sexual Activity  . Alcohol use: No  . Drug use: Not Currently    Types: Marijuana    Comment: endorses prior marijuana use 1999  . Sexual activity: Never  Other Topics Concern  . Not on file  Social History Narrative  . Not on file   Social Determinants of Health   Financial Resource Strain:   . Difficulty of Paying Living Expenses: Not on file  Food Insecurity:   . Worried About Charity fundraiser in the Last Year: Not on file  . Ran Out of Food in the Last Year: Not on file  Transportation Needs:   . Lack of Transportation (Medical): Not on file  . Lack of Transportation (Non-Medical): Not on file  Physical Activity:   . Days of Exercise per Week: Not on file  . Minutes of Exercise per Session: Not on file   Stress:   . Feeling of Stress : Not on file  Social Connections:   . Frequency of Communication with Friends and Family: Not on file  . Frequency of Social Gatherings with Friends and Family: Not on file  . Attends Religious Services: Not on file  . Active Member of Clubs or Organizations: Not on file  . Attends Archivist Meetings: Not on file  . Marital Status: Not on file  Intimate Partner Violence:   . Fear of Current or Ex-Partner: Not on file  . Emotionally Abused: Not on file  . Physically Abused: Not on file  . Sexually Abused: Not on file    Past Surgical History:  Procedure Laterality Date  . BREAST BIOPSY Right 2019   11 oc-INVASIVE MAMMARY CARCINOMA, NO SPECIAL TYPE  . BREAST BIOPSY Right 2019   1030 oc- neg  . BREAST LUMPECTOMY  2006   left breast   . CATARACT EXTRACTION     left eye  . HERNIA REPAIR  05/25/2006   Incarcerated omentum in ventral hernia, Ventralight mesh, combined lap/ open with omental resection.   Marland Kitchen PARTIAL MASTECTOMY WITH NEEDLE LOCALIZATION Right 06/14/2018   Procedure: PARTIAL MASTECTOMY WITH NEEDLE LOCALIZATION;  Surgeon: Robert Bellow, MD;  Location: [facility] ORS;  Service: General;  Laterality: Right;  . PORT-A-CATH REMOVAL  05/25/2006  . SENTINEL NODE BIOPSY Right 06/14/2018   Procedure: SENTINEL NODE BIOPSY;  Surgeon: Robert Bellow, MD;  Location: [facility] ORS;  Service: General;  Laterality: Right;  . TUBAL LIGATION      Family History  Problem Relation Age of Onset  . Hypertension Mother        deceased 32  . Cataracts Mother   . Breast cancer Other 12       maternal half-sister; deceased 65  . Coronary artery disease Other     No Known Allergies     Assessment & Plan:   1. Lymphedema  No surgery or intervention at this point in time.    I have reviewed my discussion with the patient regarding lymphedema and why it  causes symptoms.  Patient will continue wearing graduated compression stockings class 1 (20-30  mmHg) on a daily basis a prescription was given. The patient is reminded to put the stockings on first thing in the morning and removing them in the evening. The patient is instructed specifically not to sleep in the stockings.   In addition, behavioral modification throughout the day will be continued.  This will include frequent  elevation (such as in a recliner), use of over the counter pain medications as needed and exercise such as walking.  I have reviewed systemic causes for chronic edema such as liver, kidney and cardiac etiologies and there does not appear to be any significant changes in these organ systems over the past year.  The patient is under the impression that these organ systems are all stable and unchanged.     The patient will follow-up with me in one year   2. Benign essential HTN Continue antihypertensive medications as already ordered, these medications have been reviewed and there are no changes at this time.    Current Outpatient Medications on File Prior to Visit  Medication Sig Dispense Refill  . anastrozole (ARIMIDEX) 1 MG tablet TAKE 1 TABLET BY MOUTH DAILY. PATIENT MUST MAKE AN APPT TO SEE DR RAO 90 tablet 0  . aspirin EC 81 MG tablet Take 81 mg by mouth daily.      . cloNIDine (CATAPRES) 0.2 MG tablet Take 1 tablet (0.2 mg total) by mouth 2 (two) times daily. 60 tablet 11  . clotrimazole (LOTRIMIN) 1 % cream Apply 1 application topically 2 (two) times daily.   1  . [facility] CALCIUM 600 & VITAMIN D3 600-800 MG-UNIT TABS TAKE 1 TABLET BY MOUTH TWICE A DAY 180 tablet 2  . diclofenac sodium (VOLTAREN) 1 % GEL Apply 4 g topically 4 (four) times daily. 100 g 0  . diclofenac Sodium (VOLTAREN) 1 % GEL Apply 1 application topically as needed.    . Incontinence Supply Disposable (BLADDER CONTROL PADS EX ABSORB) [place]     . loratadine (CLARITIN) 10 MG tablet Take 10 mg by mouth daily.    Marland Kitchen losartan (COZAAR) 25 MG tablet Take 25 mg by mouth daily.     . methocarbamol (ROBAXIN)  500 MG tablet Take 500 mg by mouth 2 (two) times daily as needed.    Marland Kitchen omeprazole (PRILOSEC) 20 MG capsule Take 20 mg by mouth daily.    Marland Kitchen oxybutynin (DITROPAN) 5 MG tablet Take 5 mg by mouth 2 (two) times daily.    . potassium chloride SA (K-DUR) 20 MEQ tablet Take 1 tablet (20 meq) by mouth once daily    . tiZANidine (ZANAFLEX) 2 MG tablet 1 TAB BY MOUTH 3 TIMES DAILY AS NEEDED MUSCLE SPASM    . torsemide (DEMADEX) 20 MG tablet TAKE 1 TABLET BY MOUTH TWICE A DAY 180 tablet 3  . triamcinolone cream (KENALOG) 0.1 % Apply 1 application topically 2 (two) times daily. Back of legs    . isosorbide mononitrate (IMDUR) 60 MG 24 hr tablet Take 1 tablet (60 mg total) by mouth 2 (two) times daily. 180 tablet 3  . [DISCONTINUED] anastrozole (ARIMIDEX) 1 MG tablet Take 1 tablet (1 mg total) by mouth daily. Patient MUST make an appointment to see Dr Janese Banks before prescription will be filled again 15 tablet 0   No current facility-administered medications on file prior to visit.    There are no Patient Instructions on file for this visit. No follow-ups on file.   Kris Hartmann, NP

## 2020-08-09 ENCOUNTER — Inpatient Hospital Stay: Payer: Medicare HMO | Attending: Oncology | Admitting: Oncology

## 2020-08-09 ENCOUNTER — Encounter: Payer: Self-pay | Admitting: Oncology

## 2020-08-09 ENCOUNTER — Other Ambulatory Visit: Payer: Self-pay

## 2020-08-09 VITALS — BP 127/73 | HR 55 | Temp 98.3°F | Resp 16 | Ht 69.0 in | Wt 253.7 lb

## 2020-08-09 DIAGNOSIS — Z79811 Long term (current) use of aromatase inhibitors: Secondary | ICD-10-CM | POA: Diagnosis not present

## 2020-08-09 DIAGNOSIS — Z5181 Encounter for therapeutic drug level monitoring: Secondary | ICD-10-CM | POA: Diagnosis not present

## 2020-08-09 DIAGNOSIS — Z87891 Personal history of nicotine dependence: Secondary | ICD-10-CM | POA: Insufficient documentation

## 2020-08-09 DIAGNOSIS — Z08 Encounter for follow-up examination after completed treatment for malignant neoplasm: Secondary | ICD-10-CM

## 2020-08-09 DIAGNOSIS — Z9221 Personal history of antineoplastic chemotherapy: Secondary | ICD-10-CM | POA: Insufficient documentation

## 2020-08-09 DIAGNOSIS — Z79899 Other long term (current) drug therapy: Secondary | ICD-10-CM | POA: Diagnosis not present

## 2020-08-09 DIAGNOSIS — Z923 Personal history of irradiation: Secondary | ICD-10-CM | POA: Insufficient documentation

## 2020-08-09 DIAGNOSIS — Z853 Personal history of malignant neoplasm of breast: Secondary | ICD-10-CM

## 2020-08-09 DIAGNOSIS — C50411 Malignant neoplasm of upper-outer quadrant of right female breast: Secondary | ICD-10-CM | POA: Insufficient documentation

## 2020-08-09 NOTE — Progress Notes (Signed)
Pt said she woke up one day and felt a lump on her chest bone in center of chest but near her throat area. She also felt for few days that her collar bone was sticking up more than usual on right side. Her eating is not as good. She does not have a good appetite and her family does not let her eat pig products of any kind because it is not good for her.she does feel depressed but it is not new and she has tried zoloft from her PCP and it did not make her feel good- made her feeling worse and does not want another med for it. She says the depression comes and goes

## 2020-08-14 ENCOUNTER — Encounter: Payer: Self-pay | Admitting: Oncology

## 2020-08-14 NOTE — Progress Notes (Signed)
Hematology/Oncology Consult note [facility]  [phone] [phone]  Patient Care Team: Donnie Coffin, MD as PCP - General (Family Medicine) Minna Merritts, MD as Consulting Physician (Cardiology)   Name of the patient: [name]  [id number]  12-Oct-1945   Date of visit: 08/14/20  Diagnosis- stage I right breast cancer  Chief complaint/ Reason for visit-routine follow-up of breast cancer  Heme/Onc history:  patient is a 74 year old female with a prior history of left breast cancer in 2006. 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 She has been in remission since then. She has been getting periodic mammograms and the most recent mammogram and ultrasound in August 2019 showed 0.9 x 0.9 x 0.8 cm irregular spiculated mass in the 11 o'clock position of the right breast. There was a concern for an abnormal intramammary lymph node as well. No other abnormal axillary adenopathy noted. Patient underwent ultrasound-guided biopsy of both the right breast mass as well as the axillary lymph node lymph node biopsy was negative for malignancy. Biopsy of the right breast mass showed 6 mm, grade 1 ER PR positive and HER-2/neu negative tumor.  Patient is morbidly obese and has difficulty with ambulation. She uses a cane or a walker to ambulate around the house. She sees Dr. Rockey Situ for her cardiology issues. Reports that her appetite is good and she denies any unintentional weight loss or any new aches or pains anywhere. Family history significant for breast cancer in her sister who died from breast cancer in her 50s. genetic testing was negative  Final pathology showed7 mm grade 2 invasive mammary carcinoma ER PR positive her 2 negative. Negative nodes. Clear margins  Oncotype score came as low risk 8 and she does not require adjuvant chemotherapy She completed adjuvant radiation  and started Arimidex in December 2019  Interval history-she remains compliant with Arimidex. She continues to struggle with chronic health issues as well as leg swelling and impaired ambulation. She uses a rolling walker to ambulate  ECOG PS- 2 Pain scale- 3  Review of systems- Review of Systems  Constitutional: Positive for malaise/fatigue. Negative for chills, fever and weight loss.  HENT: Negative for congestion, ear discharge and nosebleeds.   Eyes: Negative for blurred vision.  Respiratory: Negative for cough, hemoptysis, sputum production, shortness of breath and wheezing.   Cardiovascular: Positive for leg swelling. Negative for chest pain, palpitations, orthopnea and claudication.  Gastrointestinal: Negative for abdominal pain, blood in stool, constipation, diarrhea, heartburn, melena, nausea and vomiting.  Genitourinary: Negative for dysuria, flank pain, frequency, hematuria and urgency.  Musculoskeletal: Negative for back pain, joint pain and myalgias.  Skin: Negative for rash.  Neurological: Negative for dizziness, tingling, focal weakness, seizures, weakness and headaches.  Endo/Heme/Allergies: Does not bruise/bleed easily.  Psychiatric/Behavioral: Negative for depression and suicidal ideas. The patient does not have insomnia.       No Known Allergies   Past Medical History:  Diagnosis Date  . Abnormality of gait   . Breast cancer ([place]) 02/05/2005   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   . Breast cancer ([place]) 2019   right breast  . CHF (congestive heart failure) ([place])    March 28, 2017 echo: EF 50-55%; 1) echo 07/2011: EF 20-25%, mild concentric hypertrophy, diffuse HK, regional WMA cannot be excluded, grade 1 DD, mitral valve with mild regurg, LA mildly dilated). 2)  EF 40-45%, mild AS,                    . Complication of anesthesia 2006   pt states "hard to wake up" after L breast  surgery   . Edema   . GERD (gastroesophageal reflux disease)   . Headache   . Heart murmur   . Hypertension   . Hypokinesis    global, EF 35-45 % from Echo.  . Malignant neoplasm of breast (female), unspecified site 06/14/2018   RIGHT T1b, N0; ER/PR positive, HER-2 negative.  . Neuropathy   . Obesity   . Pain in joint, site unspecified   . Personal history of chemotherapy 2006   left breast  . Personal history of radiation therapy 2019   right breast  . Personal history of radiation therapy 2006   left breast  . Pneumonia   . Sleep apnea   . Tinea pedis   . Unspecified hearing loss    bilateral     Past Surgical History:  Procedure Laterality Date  . BREAST BIOPSY Right 2019   11 oc-INVASIVE MAMMARY CARCINOMA, NO SPECIAL TYPE  . BREAST BIOPSY Right 2019   1030 oc- neg  . BREAST LUMPECTOMY  2006   left breast   . CATARACT EXTRACTION     left eye  . HERNIA REPAIR  05/25/2006   Incarcerated omentum in ventral hernia, Ventralight mesh, combined lap/ open with omental resection.   Marland Kitchen PARTIAL MASTECTOMY WITH NEEDLE LOCALIZATION Right 06/14/2018   Procedure: PARTIAL MASTECTOMY WITH NEEDLE LOCALIZATION;  Surgeon: Robert Bellow, MD;  Location: [facility] ORS;  Service: General;  Laterality: Right;  . PORT-A-CATH REMOVAL  05/25/2006  . SENTINEL NODE BIOPSY Right 06/14/2018   Procedure: SENTINEL NODE BIOPSY;  Surgeon: Robert Bellow, MD;  Location: [facility] ORS;  Service: General;  Laterality: Right;  . TUBAL LIGATION      Social History   Socioeconomic History  . Marital status: Divorced    Spouse name: Not on file  . Number of children: Not on file  . Years of education: Not on file  . Highest education level: Not on file  Occupational History  . Occupation: retired  Tobacco Use  . Smoking status: Former Smoker    Packs/day: 0.50    Years: 30.00    Pack years: 15.00    Types: Cigarettes    Quit date: 07/14/2000    Years since quitting: 20.0  . Smokeless tobacco:  Never Used  Vaping Use  . Vaping Use: Never used  Substance and Sexual Activity  . Alcohol use: No  . Drug use: Not Currently    Types: Marijuana    Comment: endorses prior marijuana use 1999  . Sexual activity: Never  Other Topics Concern  . Not on file  Social History Narrative  . Not on file   Social Determinants of Health   Financial Resource Strain:   . Difficulty of Paying Living Expenses: Not on file  Food Insecurity:   . Worried About Charity fundraiser in the Last Year: Not on file  . Ran Out of Food in the Last Year: Not on file  Transportation Needs:   . Lack of Transportation (Medical): Not on file  . Lack of Transportation (Non-Medical): Not on file  Physical Activity:   . Days of Exercise per Week: Not on file  . Minutes of Exercise per Session: Not on file  Stress:   . Feeling of Stress : Not  on file  Social Connections:   . Frequency of Communication with Friends and Family: Not on file  . Frequency of Social Gatherings with Friends and Family: Not on file  . Attends Religious Services: Not on file  . Active Member of Clubs or Organizations: Not on file  . Attends Archivist Meetings: Not on file  . Marital Status: Not on file  Intimate Partner Violence:   . Fear of Current or Ex-Partner: Not on file  . Emotionally Abused: Not on file  . Physically Abused: Not on file  . Sexually Abused: Not on file    Family History  Problem Relation Age of Onset  . Hypertension Mother        deceased 29  . Cataracts Mother   . Breast cancer Other 55       maternal half-sister; deceased 70  . Coronary artery disease Other      Current Outpatient Medications:  .  anastrozole (ARIMIDEX) 1 MG tablet, TAKE 1 TABLET BY MOUTH DAILY. PATIENT MUST MAKE AN APPT TO SEE DR Blimy Napoleon, Disp: 90 tablet, Rfl: 0 .  aspirin EC 81 MG tablet, Take 81 mg by mouth daily.  , Disp: , Rfl:  .  cloNIDine (CATAPRES) 0.2 MG tablet, Take 1 tablet (0.2 mg total) by mouth 2 (two)  times daily., Disp: 60 tablet, Rfl: 11 .  clotrimazole (LOTRIMIN) 1 % cream, Apply 1 application topically 2 (two) times daily. , Disp: , Rfl: 1 .  [employer] CALCIUM 600 & VITAMIN D3 600-800 MG-UNIT TABS, TAKE 1 TABLET BY MOUTH TWICE A DAY, Disp: 180 tablet, Rfl: 2 .  diclofenac Sodium (VOLTAREN) 1 % GEL, Apply 1 application topically as needed., Disp: , Rfl:  .  Incontinence Supply Disposable (BLADDER CONTROL PADS EX ABSORB) MISC, Apply 1 Dose topically as needed. , Disp: , Rfl:  .  isosorbide mononitrate (IMDUR) 60 MG 24 hr tablet, Take 1 tablet (60 mg total) by mouth 2 (two) times daily., Disp: 180 tablet, Rfl: 3 .  loratadine (CLARITIN) 10 MG tablet, Take 10 mg by mouth daily., Disp: , Rfl:  .  losartan (COZAAR) 25 MG tablet, Take 25 mg by mouth daily. , Disp: , Rfl:  .  methocarbamol (ROBAXIN) 500 MG tablet, Take 500 mg by mouth 2 (two) times daily as needed., Disp: , Rfl:  .  omeprazole (PRILOSEC) 20 MG capsule, Take 20 mg by mouth daily., Disp: , Rfl:  .  oxybutynin (DITROPAN) 5 MG tablet, Take 5 mg by mouth 2 (two) times daily., Disp: , Rfl:  .  potassium chloride SA (K-DUR) 20 MEQ tablet, Take 1 tablet (20 meq) by mouth once daily, Disp: , Rfl:  .  torsemide (DEMADEX) 20 MG tablet, TAKE 1 TABLET BY MOUTH TWICE A DAY, Disp: 180 tablet, Rfl: 3 .  triamcinolone cream (KENALOG) 0.1 %, Apply 1 application topically 2 (two) times daily. Back of legs, Disp: , Rfl:   Physical exam:  Vitals:   08/09/20 1100  BP: 127/73  Pulse: (!) 55  Resp: 16  Temp: 98.3 F (36.8 C)  TempSrc: Oral  Weight: 253 lb 11.2 oz (115.1 kg)  Height: 5' 9"  (1.753 m)   Physical Exam Constitutional:      General: She is not in acute distress.    Comments: Ambulates with a walker  Cardiovascular:     Rate and Rhythm: Normal rate and regular rhythm.     Heart sounds: Normal heart sounds.  Pulmonary:     Effort:  Pulmonary effort is normal.     Breath sounds: Normal breath sounds.  Abdominal:     General: Bowel  sounds are normal.     Palpations: Abdomen is soft.  Skin:    General: Skin is warm and dry.  Neurological:     Mental Status: She is alert and oriented to person, place, and time.   Breast exam is limited as patient cannot sit up on the examination table. Exam performed in sitting position. Overtly no palpable masses in either breast there are no palpable bilateral axillary adenopathy  CMP Latest Ref Rng & Units 02/10/2020  Glucose 70 - 99 mg/dL 114(H)  BUN 8 - 23 mg/dL 24(H)  Creatinine 0.44 - 1.00 mg/dL 1.41(H)  Sodium 135 - 145 mmol/L 139  Potassium 3.5 - 5.1 mmol/L 4.4  Chloride 98 - 111 mmol/L 100  CO2 22 - 32 mmol/L 27  Calcium 8.9 - 10.3 mg/dL 9.0  Total Protein 6.5 - 8.1 g/dL 8.2(H)  Total Bilirubin 0.3 - 1.2 mg/dL 0.6  Alkaline Phos 38 - 126 U/L 63  AST 15 - 41 U/L 17  ALT 0 - 44 U/L 12   CBC Latest Ref Rng & Units 02/10/2020  WBC 4.0 - 10.5 K/uL 7.4  Hemoglobin 12.0 - 15.0 g/dL 12.4  Hematocrit 36 - 46 % 37.7  Platelets 150 - 400 K/uL 327     Assessment and plan- Patient is a 74 y.o. female with a history of invasive mammary, of the right breast in [id number] ER PR positive HER-2/neu negative status post lumpectomy and adjuvant radiation. This is a routine follow-up visit for breast cancer  Clinically patient is doing well with no concerning signs and symptoms of recurrence based on today's exam. September 2021 mammogram was unremarkable and her bone density is normal as well. She will continue to take Arimidex along with calcium and vitamin D for 5 years and I will see her back in 6 months with CBC with differential and CMP   Visit Diagnosis 1. High risk medication use   2. Encounter for follow-up surveillance of breast cancer   3. Visit for monitoring Arimidex therapy      Dr. Randa Evens, MD, MPH [facility] [phone] 08/14/2020 8:50 AM

## 2020-08-22 ENCOUNTER — Other Ambulatory Visit: Payer: Self-pay | Admitting: Oncology

## 2020-09-24 ENCOUNTER — Ambulatory Visit (INDEPENDENT_AMBULATORY_CARE_PROVIDER_SITE_OTHER): Payer: Medicare HMO | Admitting: Nurse Practitioner

## 2020-10-08 ENCOUNTER — Other Ambulatory Visit: Payer: Self-pay | Admitting: Oncology

## 2020-10-11 ENCOUNTER — Encounter: Payer: Self-pay | Admitting: Radiation Oncology

## 2020-10-11 ENCOUNTER — Ambulatory Visit
Admission: RE | Admit: 2020-10-11 | Discharge: 2020-10-11 | Disposition: A | Discharge status: Home / Self Care | Payer: Medicare HMO | Source: Ambulatory Visit | Attending: Radiation Oncology | Admitting: Radiation Oncology

## 2020-10-11 VITALS — BP 95/63 | HR 66 | Temp 98.7°F | Resp 20

## 2020-10-11 DIAGNOSIS — Z170 Estrogen receptor positive status [ER+]: Secondary | ICD-10-CM | POA: Insufficient documentation

## 2020-10-11 DIAGNOSIS — Z79811 Long term (current) use of aromatase inhibitors: Secondary | ICD-10-CM | POA: Insufficient documentation

## 2020-10-11 DIAGNOSIS — C50411 Malignant neoplasm of upper-outer quadrant of right female breast: Secondary | ICD-10-CM | POA: Diagnosis not present

## 2020-10-11 DIAGNOSIS — Z923 Personal history of irradiation: Secondary | ICD-10-CM | POA: Insufficient documentation

## 2020-10-11 NOTE — Progress Notes (Signed)
Radiation Oncology Follow up Note  Name: [name]   Date:   10/11/2020 MRN:  [id number] DOB: Feb 17, 1946    This 75 y.o. female presents to the clinic today for 2-year follow-up status post hypofractionated radiation therapy to her right breast for stage I ER/PR positive invasive mammary carcinoma.  REFERRING PROVIDER: Donnie Coffin, MD  HPI: Patient is a 75 year old female now out 2 years having completed hypofractionated radiation therapy to her right breast for stage I ER/PR positive invasive mammary carcinoma seen today in routine follow-up she is doing well from a breast standpoint she specifically denies breast tenderness cough or bone pain..  She is currently on Arimidex tolerant well without side effect.  She had mammograms back in September which I have reviewed were BI-RADS 2 benign.  COMPLICATIONS OF TREATMENT: none  FOLLOW UP COMPLIANCE: keeps appointments   PHYSICAL EXAM:  BP 95/63   Pulse 66   Temp 98.7 F (37.1 C)   Resp 20   SpO2 100%  Lungs are clear to A&P cardiac examination essentially unremarkable with regular rate and rhythm. No dominant mass or nodularity is noted in either breast in 2 positions examined. Incision is well-healed. No axillary or supraclavicular adenopathy is appreciated. Cosmetic result is excellent.  Well-developed well-nourished patient in NAD. HEENT reveals PERLA, EOMI, discs not visualized.  Oral cavity is clear. No oral mucosal lesions are identified. Neck is clear without evidence of cervical or supraclavicular adenopathy. Lungs are clear to A&P. Cardiac examination is essentially unremarkable with regular rate and rhythm without murmur rub or thrill. Abdomen is benign with no organomegaly or masses noted. Motor sensory and DTR levels are equal and symmetric in the upper and lower extremities. Cranial nerves II through XII are grossly intact. Proprioception is intact. No peripheral adenopathy or edema is identified. No motor or sensory levels  are noted. Crude visual fields are within normal range.  RADIOLOGY RESULTS: Mammograms reviewed compatible with above-stated findings  PLAN: Present time patient is now out 2 years with no evidence of disease.  I am pleased with her overall progress.  I have asked to see her back in 1 year for follow-up.  She continues close follow-up care with medical oncology.  Patient knows to call with any concerns.  I would like to take this opportunity to thank you for allowing me to participate in the care of your patient.[name], MD

## 2020-10-29 ENCOUNTER — Other Ambulatory Visit: Payer: Self-pay | Admitting: Oncology

## 2020-11-05 NOTE — Progress Notes (Signed)
Date:  11/06/2020   ID:  [name], DOB 1946/01/08, MRN [id number]  Patient Location:  [address] [place] [address]   Provider location:   [facility], [place] office  PCP:  Donnie Coffin, MD  Cardiologist:  Patsy Baltimore   Chief Complaint  Patient presents with  . Other    6 month f/u pt would like to discuss mobility. Meds reviewed verbally with pt.    History of Present Illness:    [name] is a 75 y.o. female past medical history of Medication confusion/Medication noncompliance No family to help, uses [employer] for transportation morbid obesity,  severe lower extremity edema, dating back to 8341 systolic CHF, ejection fraction 25% in 2012  , Up to EF 50% to 55% in 03/2017 sleep apnea, scheduled for repeat testing. Previous machine broke pulmonary disease,  severe hypertension,  likely secondary to medication noncompliance who presents For follow-up of her hypertension, cardiomyopathy, edema  LOV 05/2020  Taking torsemide 40 daily  presents in wheelchair  difficulty walking Weight down, still with significant leg swelling, lymph Unable to put compression hose on  Labs 10/2020 reviewed CR 1.4, BUN 28 CBC normal  Prior  Blistering on legs, previously followed by wound clinic previous leg wraps  Was never seen for lymphedema,  Does not have pumps  Not using her CPAP Previously received a new machine from [employer] mask  too small   EKG personally reviewed by myself on todays visit Shows sinus bradycardia rate 55 bpm nonspecific T wave abnormality   Other past medical history reviewed hospitalization July 2018 for TIA and chest pain Slurring, head hurt, H/A, felt hot She had MRI brain without acute finding Neurology consult with no clear recommendations Chest pain resolved without intervention Carotid ultrasound without significant stenosis SNF was recommended   Prior CV studies:   The following studies were  reviewed today:    Past Medical History:  Diagnosis Date  . Abnormality of gait   . Breast cancer ([place]) 02/05/2005   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   . Breast cancer ([place]) 2019   right breast  . CHF (congestive heart failure) ([place])    March 28, 2017 echo: EF 50-55%; 1) echo 07/2011: EF 20-25%, mild concentric hypertrophy, diffuse HK, regional WMA cannot be excluded, grade 1 DD, mitral valve with mild regurg, LA mildly dilated). 2) EF 40-45%, mild AS,                    . Complication of anesthesia 2006   pt states "hard to wake up" after L breast surgery   . Edema   . GERD (gastroesophageal reflux disease)   . Headache   . Heart murmur   . Hypertension   . Hypokinesis    global, EF 35-45 % from Echo.  . Malignant neoplasm of breast (female), unspecified site 06/14/2018   RIGHT T1b, N0; ER/PR positive, HER-2 negative.  . Neuropathy   . Obesity   . Pain in joint, site unspecified   . Personal history of chemotherapy 2006   left breast  . Personal history of radiation therapy 2019   right breast  . Personal history of radiation therapy 2006   left breast  . Pneumonia   . Sleep apnea   . Tinea pedis   . Unspecified hearing loss    bilateral   Past Surgical  History:  Procedure Laterality Date  . BREAST BIOPSY Right 2019   11 oc-INVASIVE MAMMARY CARCINOMA, NO SPECIAL TYPE  . BREAST BIOPSY Right 2019   1030 oc- neg  . BREAST LUMPECTOMY  2006   left breast   . CATARACT EXTRACTION     left eye  . HERNIA REPAIR  05/25/2006   Incarcerated omentum in ventral hernia, Ventralight mesh, combined lap/ open with omental resection.   Marland Kitchen PARTIAL MASTECTOMY WITH NEEDLE LOCALIZATION Right 06/14/2018   Procedure: PARTIAL MASTECTOMY WITH NEEDLE LOCALIZATION;  Surgeon: Earline Mayotte, MD;  Location: [facility] ORS;  Service: General;  Laterality: Right;  . PORT-A-CATH REMOVAL  05/25/2006  . SENTINEL  NODE BIOPSY Right 06/14/2018   Procedure: SENTINEL NODE BIOPSY;  Surgeon: Earline Mayotte, MD;  Location: [facility] ORS;  Service: General;  Laterality: Right;  . TUBAL LIGATION       Current Meds  Medication Sig  . anastrozole (ARIMIDEX) 1 MG tablet TAKE 1 TABLET BY MOUTH DAILY. PATIENT MUST MAKE AN APPT TO SEE DR RAO  . aspirin EC 81 MG tablet Take 81 mg by mouth daily.  . cloNIDine (CATAPRES) 0.2 MG tablet Take 1 tablet (0.2 mg total) by mouth 2 (two) times daily.  . clotrimazole (LOTRIMIN) 1 % cream Apply 1 application topically 2 (two) times daily.   . [employer]S CALCIUM 600 & VITAMIN D3 600-800 MG-UNIT TABS TAKE 1 TABLET BY MOUTH TWICE A DAY  . diclofenac Sodium (VOLTAREN) 1 % GEL Apply 1 application topically as needed.  . Incontinence Supply Disposable (BLADDER CONTROL PADS EX ABSORB) MISC Apply 1 Dose topically as needed.   . isosorbide mononitrate (IMDUR) 60 MG 24 hr tablet Take 1 tablet (60 mg total) by mouth 2 (two) times daily.  Marland Kitchen loratadine (CLARITIN) 10 MG tablet Take 10 mg by mouth daily.  Marland Kitchen losartan (COZAAR) 25 MG tablet Take 25 mg by mouth daily.   . methocarbamol (ROBAXIN) 500 MG tablet Take 500 mg by mouth 2 (two) times daily as needed.  Marland Kitchen omeprazole (PRILOSEC) 20 MG capsule Take 20 mg by mouth daily.  Marland Kitchen oxybutynin (DITROPAN) 5 MG tablet Take 5 mg by mouth 2 (two) times daily.  . potassium chloride SA (K-DUR) 20 MEQ tablet Take 1 tablet (20 meq) by mouth once daily  . torsemide (DEMADEX) 20 MG tablet TAKE 1 TABLET BY MOUTH TWICE A DAY  . triamcinolone cream (KENALOG) 0.1 % Apply 1 application topically 2 (two) times daily. Back of legs     Allergies:   Patient has no known allergies.   Social History   Tobacco Use  . Smoking status: Former Smoker    Packs/day: 0.50    Years: 30.00    Pack years: 15.00    Types: Cigarettes    Quit date: 07/14/2000    Years since quitting: 20.3  . Smokeless tobacco: Never Used  Vaping Use  . Vaping Use: Never used  Substance Use Topics   . Alcohol use: No  . Drug use: Not Currently    Types: Marijuana    Comment: endorses prior marijuana use 1999     Family Hx: The patient's family history includes Breast cancer (age of onset: 29) in an other family member; Cataracts in her mother; Coronary artery disease in an other family member; Hypertension in her mother.  ROS:   Please see the history of present illness.    Review of Systems  Constitutional: Negative.   HENT: Negative.   Respiratory: Negative.   Cardiovascular:  Positive for leg swelling.  Gastrointestinal: Negative.   Musculoskeletal: Negative.   Neurological: Negative.   Psychiatric/Behavioral: Negative.   All other systems reviewed and are negative.    Labs/Other Tests and Data Reviewed:    Recent Labs: 02/10/2020: ALT 12; BUN 24; Creatinine, Ser 1.41; Hemoglobin 12.4; Platelets 327; Potassium 4.4; Sodium 139   Recent Lipid Panel Lab Results  Component Value Date/Time   CHOL 141 03/28/2017 06:39 AM   TRIG 40 03/28/2017 06:39 AM   HDL 42 03/28/2017 06:39 AM   CHOLHDL 3.4 03/28/2017 06:39 AM   LDLCALC 91 03/28/2017 06:39 AM    Wt Readings from Last 3 Encounters:  11/06/20 225 lb (102.1 kg)  08/09/20 253 lb 11.2 oz (115.1 kg)  06/25/20 250 lb (113.4 kg)     Exam:    Vital Signs: Vital signs may also be detailed in the HPI BP 118/60 (BP Location: Right Arm, Patient Position: Sitting, Cuff Size: Normal)   Pulse (!) 55   Ht _0  (1.753 m)   Wt 225 lb (102.1 kg)   SpO2 98%   BMI 33.23 kg/m   Constitutional:  oriented to person, place, and time. No distress. In wheelchair HENT:  Head: Grossly normal Eyes:  no discharge. No scleral icterus.  Neck: No JVD, no carotid bruits  Cardiovascular: Regular rate and rhythm, no murmurs appreciated Pulmonary/Chest: Clear to auscultation bilaterally, no wheezes or rails Abdominal: Soft.  no distension.  no tenderness.  Musculoskeletal: Normal range of motion Neurological:  normal muscle tone.  Coordination normal. No atrophy Skin: Skin warm and dry Psychiatric: normal affect, pleasant   ASSESSMENT & PLAN:    Chronic diastolic CHF continue torsemide 40 daily, Euvolemic With lymphedema Unable to place TEDS or  wraps Previously  followed by the wound clinic Recommend leg elevation, Recommend referral to vein and vascular for consideration of lymphedema compression pumps  Morbid obesity (HCC) weight from 300 to 225 today Dramatic improvement Legs swelling still severe Goal 200 pounds  SOB (shortness of breath) Improved, stay on torsemide  Essential hypertension Blood pressure is well controlled on today's visit. No changes made to the medications.  OSA on CPAP Current mask on new machine does not fit her face Has not been using it  Lymphedema Referral placed to vein and vascular for consideration of lymphedema compression pumps     Total encounter time more than 25 minutes  Greater than 50% was spent in counseling and coordination of care with the patient   Signed, Ida Rogue, MD  11/06/2020 3:45 PM    [place] Office [address], [place], [place] [address]

## 2020-11-06 ENCOUNTER — Ambulatory Visit (INDEPENDENT_AMBULATORY_CARE_PROVIDER_SITE_OTHER): Payer: Medicare HMO | Admitting: Cardiovascular Disease

## 2020-11-06 ENCOUNTER — Encounter: Payer: Self-pay | Admitting: Cardiovascular Disease

## 2020-11-06 ENCOUNTER — Other Ambulatory Visit: Payer: Self-pay

## 2020-11-06 VITALS — BP 118/60 | HR 55 | Ht 69.0 in | Wt 225.0 lb

## 2020-11-06 DIAGNOSIS — I5032 Chronic diastolic (congestive) heart failure: Secondary | ICD-10-CM

## 2020-11-06 DIAGNOSIS — R0602 Shortness of breath: Secondary | ICD-10-CM | POA: Diagnosis not present

## 2020-11-06 DIAGNOSIS — I890 Lymphedema, not elsewhere classified: Secondary | ICD-10-CM

## 2020-11-06 DIAGNOSIS — I10 Essential (primary) hypertension: Secondary | ICD-10-CM

## 2020-11-06 DIAGNOSIS — G459 Transient cerebral ischemic attack, unspecified: Secondary | ICD-10-CM

## 2020-11-06 NOTE — Patient Instructions (Addendum)
Referral to vein and vasculat has been sent in, expect a call from Dr. Bunnie Domino office (lymphedema pumps for vascular)  Medication Instructions:  No changes  Lab work: No new labs needed  Testing/Procedures: No new testing needed   Follow-Up:  . You will need a follow up appointment in 6 months, APP ok  . Providers on your designated Care Team:   . Murray Hodgkins, NP . Christell Faith, PA-C . Marrianne Mood, PA-C  .  COVID-19 Vaccine Information can be found at: ShippingScam.co.uk For questions related to vaccine distribution or appointments, please email [email or web address] .[email or web address] or call [phone].

## 2020-11-09 ENCOUNTER — Other Ambulatory Visit: Payer: Medicare HMO

## 2020-11-12 ENCOUNTER — Other Ambulatory Visit: Admission: RE | Admit: 2020-11-12 | Payer: Medicare HMO | Source: Ambulatory Visit

## 2020-11-12 ENCOUNTER — Other Ambulatory Visit: Payer: Medicare HMO

## 2020-11-14 ENCOUNTER — Encounter: Admission: RE | Payer: Self-pay | Source: Ambulatory Visit

## 2020-11-14 ENCOUNTER — Ambulatory Visit: Admission: RE | Admit: 2020-11-14 | Payer: Medicare HMO | Source: Ambulatory Visit | Admitting: Internal Medicine

## 2020-11-14 SURGERY — ESOPHAGOGASTRODUODENOSCOPY (EGD) WITH PROPOFOL
Anesthesia: General

## 2020-12-07 ENCOUNTER — Encounter (INDEPENDENT_AMBULATORY_CARE_PROVIDER_SITE_OTHER): Payer: Medicare HMO | Admitting: Vascular Surgery

## 2020-12-11 ENCOUNTER — Encounter (INDEPENDENT_AMBULATORY_CARE_PROVIDER_SITE_OTHER): Payer: Self-pay | Admitting: Vascular Surgery

## 2020-12-11 ENCOUNTER — Ambulatory Visit (INDEPENDENT_AMBULATORY_CARE_PROVIDER_SITE_OTHER): Payer: Medicare HMO | Admitting: Vascular Surgery

## 2020-12-11 ENCOUNTER — Other Ambulatory Visit: Payer: Self-pay

## 2020-12-11 VITALS — BP 139/77 | HR 61 | Ht 69.0 in | Wt 225.0 lb

## 2020-12-11 DIAGNOSIS — L97221 Non-pressure chronic ulcer of left calf limited to breakdown of skin: Secondary | ICD-10-CM

## 2020-12-11 DIAGNOSIS — I10 Essential (primary) hypertension: Secondary | ICD-10-CM

## 2020-12-11 DIAGNOSIS — I890 Lymphedema, not elsewhere classified: Secondary | ICD-10-CM

## 2020-12-11 HISTORY — DX: Non-pressure chronic ulcer of left calf limited to breakdown of skin: L97.221

## 2020-12-11 NOTE — Assessment & Plan Note (Signed)
The patient has an ulceration on the left lower leg from severe swelling.  She has previously had a negative reflux study and normal ABIs.  Were going to wrap her in a 3 layer Unna boot today.  This will need to be changed weekly.  We will reassess this in 4 weeks to see if she can come out of Unna boots and going to compression socks.

## 2020-12-11 NOTE — Progress Notes (Signed)
Patient ID: [name], female   DOB: Aug 02, 1946, 75 y.o.   MRN: [id number]  Chief Complaint  Patient presents with  . Follow-up    [place]. Lymphedema of both lower extremites    HPI [name] is a 75 y.o. female.  I am asked to see the patient by Dr. Rockey Situ for evaluation of worsening leg swelling and now ulceration of the left lower extremity.  She was seen in our office last fall and was scheduled to come back later this year for follow-up.  She originally had improvement in her leg swelling after her visit here, but over the past couple of months it has gotten worse particularly in the left leg.  Over the past several weeks, she has developed severe pain and now weeping from the wound on the lateral aspect of the left lower leg.  No fevers or chills.  No chest pain or shortness of breath.  The right leg swelling is relatively stable and moderate.  She did not get the lymphedema pump we had discussed last fall.  The area is very painful.  She says it just burns and stings most of the day.     Past Medical History:  Diagnosis Date  . Abnormality of gait   . 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   . Breast cancer ([place]) 2019   right breast  . CHF (congestive heart failure) ([place])    March 28, 2017 echo: EF 50-55%; 1) echo 07/2011: EF 20-25%, mild concentric hypertrophy, diffuse HK, regional WMA cannot be excluded, grade 1 DD, mitral valve with mild regurg, LA mildly dilated). 2) EF 40-45%, mild AS,                    . Complication of anesthesia 2006   pt states "hard to wake up" after L breast surgery   . Edema   . GERD (gastroesophageal reflux disease)   . Headache   . Heart murmur   . Hypertension   . Hypokinesis    global, EF 35-45 % from Echo.  . Malignant neoplasm of breast (female), unspecified site 06/14/2018   RIGHT T1b, N0; ER/PR positive, HER-2  negative.  . Neuropathy   . Obesity   . Pain in joint, site unspecified   . Personal history of chemotherapy 2006   left breast  . Personal history of radiation therapy 2019   right breast  . Personal history of radiation therapy 2006   left breast  . Pneumonia   . Sleep apnea   . Tinea pedis   . Unspecified hearing loss    bilateral    Past Surgical History:  Procedure Laterality Date  . BREAST BIOPSY Right 2019   11 oc-INVASIVE MAMMARY CARCINOMA, NO SPECIAL TYPE  . BREAST BIOPSY Right 2019   1030 oc- neg  . BREAST LUMPECTOMY  2006   left breast   . CATARACT EXTRACTION     left eye  . HERNIA REPAIR  05/25/2006   Incarcerated omentum in ventral hernia, Ventralight mesh, combined lap/ open with omental resection.   Marland Kitchen PARTIAL MASTECTOMY WITH NEEDLE LOCALIZATION Right 06/14/2018   Procedure: PARTIAL MASTECTOMY WITH NEEDLE LOCALIZATION;  Surgeon: Robert Bellow, MD;  Location: [facility] ORS;  Service: General;  Laterality: Right;  . PORT-A-CATH REMOVAL  05/25/2006  . SENTINEL NODE BIOPSY Right 06/14/2018   Procedure: SENTINEL NODE BIOPSY;  Surgeon: Bary Castilla,  Forest Gleason, MD;  Location: [facility] ORS;  Service: General;  Laterality: Right;  . TUBAL LIGATION       Family History  Problem Relation Age of Onset  . Hypertension Mother        deceased 38  . Cataracts Mother   . Breast cancer Other 53       maternal half-sister; deceased 14  . Coronary artery disease Other      Social History   Tobacco Use  . Smoking status: Former Smoker    Packs/day: 0.50    Years: 30.00    Pack years: 15.00    Types: Cigarettes    Quit date: 07/14/2000    Years since quitting: 20.4  . Smokeless tobacco: Never Used  Vaping Use  . Vaping Use: Never used  Substance Use Topics  . Alcohol use: No  . Drug use: Not Currently    Types: Marijuana    Comment: endorses prior marijuana use 1999     No Known Allergies  Current Outpatient Medications  Medication Sig Dispense Refill  .  anastrozole (ARIMIDEX) 1 MG tablet TAKE 1 TABLET BY MOUTH DAILY. PATIENT MUST MAKE AN APPT TO SEE DR RAO 90 tablet 0  . aspirin EC 81 MG tablet Take 81 mg by mouth daily.    . cloNIDine (CATAPRES) 0.2 MG tablet Take 1 tablet (0.2 mg total) by mouth 2 (two) times daily. 60 tablet 11  . clotrimazole (LOTRIMIN) 1 % cream Apply 1 application topically 2 (two) times daily.   1  . [employer] CALCIUM 600 & VITAMIN D3 600-800 MG-UNIT TABS TAKE 1 TABLET BY MOUTH TWICE A DAY 180 tablet 2  . diclofenac Sodium (VOLTAREN) 1 % GEL Apply 1 application topically as needed.    . Incontinence Supply Disposable (BLADDER CONTROL PADS EX ABSORB) MISC Apply 1 Dose topically as needed.     . loratadine (CLARITIN) 10 MG tablet Take 10 mg by mouth daily.    Marland Kitchen losartan (COZAAR) 25 MG tablet Take 25 mg by mouth daily.     . methocarbamol (ROBAXIN) 500 MG tablet Take 500 mg by mouth 2 (two) times daily as needed.    Marland Kitchen omeprazole (PRILOSEC) 20 MG capsule Take 20 mg by mouth daily.    Marland Kitchen oxybutynin (DITROPAN) 5 MG tablet Take 5 mg by mouth 2 (two) times daily.    . potassium chloride SA (K-DUR) 20 MEQ tablet Take 1 tablet (20 meq) by mouth once daily    . torsemide (DEMADEX) 20 MG tablet TAKE 1 TABLET BY MOUTH TWICE A DAY 180 tablet 3  . triamcinolone cream (KENALOG) 0.1 % Apply 1 application topically 2 (two) times daily. Back of legs    . isosorbide mononitrate (IMDUR) 60 MG 24 hr tablet Take 1 tablet (60 mg total) by mouth 2 (two) times daily. 180 tablet 3   No current facility-administered medications for this visit.      REVIEW OF SYSTEMS (Negative unless checked)  Constitutional: _0 Weight loss  _1 Fever  _2 Chills Cardiac: _3 Chest pain   _4 Chest pressure   _5 Palpitations   _6 Shortness of breath when laying flat   _7 Shortness of breath at rest   _8 Shortness of breath with exertion. Vascular:  _9 Pain in legs with walking   _10 Pain in legs at rest   _11 Pain in legs when laying flat   _12 Claudication   _13 Pain in feet when  walking  _14 Pain in feet at rest  _15 Pain in feet when laying flat   _16 History of DVT   _17   Phlebitis   _0 Swelling in legs   _1 Varicose veins   _2 Non-healing ulcers Pulmonary:   _3 Uses home oxygen   _4 Productive cough   _5 Hemoptysis   _6 Wheeze  _7 COPD   _8 Asthma Neurologic:  _9 Dizziness  _10 Blackouts   _11 Seizures   _12 History of stroke   _13 History of TIA  _14 Aphasia   _15 Temporary blindness   _16 Dysphagia   _17 Weakness or numbness in arms   _18 Weakness or numbness in legs Musculoskeletal:  _19 Arthritis   _20 Joint swelling   _21 Joint pain   _22 Low back pain Hematologic:  _23 Easy bruising  _24 Easy bleeding   _25 Hypercoagulable state   _26 Anemic  _27 Hepatitis Gastrointestinal:  _28 Blood in stool   _29 Vomiting blood  _30 Gastroesophageal reflux/heartburn   _31 Abdominal pain Genitourinary:  _32 Chronic kidney disease   _33 Difficult urination  _34 Frequent urination  _35 Burning with urination   _36 Hematuria Skin:  _37 Rashes   _38 Ulcers   _39 Wounds Psychological:  _40 History of anxiety   _41  History of major depression.    Physical Exam BP 139/77   Pulse 61   Ht _42  (1.753 m)   Wt 225 lb (102.1 kg)   BMI 33.23 kg/m  Gen:  WD/WN, NAD Head: [place]/AT, No temporalis wasting.  Ear/Nose/Throat: Hearing grossly intact, nares w/o erythema or drainage, oropharynx w/o Erythema/Exudate Eyes: Conjunctiva clear, sclera non-icteric  Neck: trachea midline.  No JVD.  Pulmonary:  Good air movement, respirations not labored, no use of accessory muscles  Cardiac: RRR, no JVD Vascular:  Vessel Right Left  Radial Palpable Palpable                          DP  1+  not palpable  PT  not palpable  not palpable    Musculoskeletal: M/S 5/5 throughout.  Extremities without ischemic changes.  No deformity or atrophy.  In a wheelchair.  1-2+ right lower extremity edema.  3+ left lower extremity edema. Neurologic: Sensation grossly intact in extremities.  Symmetrical.  Speech is fluent. Motor exam as listed above. Psychiatric: Judgment  intact, Mood & affect appropriate for pt's clinical situation. Dermatologic: Quarter sized ulceration on the left lower lateral leg without erythema and with some serous drainage.    Radiology No results found.  Labs No results found for this or any previous visit (from the past 2160 hour(s)).  Assessment/Plan:  Lower limb ulcer, calf, left, limited to breakdown of skin ([place]) The patient has an ulceration on the left lower leg from severe swelling.  She has previously had a negative reflux study and normal ABIs.  Were going to wrap her in a 3 layer Unna boot today.  This will need to be changed weekly.  We will reassess this in 4 weeks to see if she can come out of Unna boots and going to compression socks.  HTN (hypertension) blood pressure control important in reducing the progression of atherosclerotic disease. On appropriate oral medications.   Lymphedema The patient has at least stage II lymphedema and may have now progressed to stage III lymphedema.  Were going to put her in an Unna boot on the left leg now to get the skin breakdown under control and hopefully improve her swelling.  She would clearly benefit from a lymphedema pump as an adjuvant therapy for her severe and refractory swelling.      Leotis Pain 12/11/2020, 1:39 PM   This note was created with Dragon medical transcription system.  Any errors from dictation are unintentional.

## 2020-12-11 NOTE — Assessment & Plan Note (Signed)
blood pressure control important in reducing the progression of atherosclerotic disease. On appropriate oral medications.  

## 2020-12-11 NOTE — Assessment & Plan Note (Signed)
The patient has at least stage II lymphedema and may have now progressed to stage III lymphedema.  Were going to put her in an Unna boot on the left leg now to get the skin breakdown under control and hopefully improve her swelling.  She would clearly benefit from a lymphedema pump as an adjuvant therapy for her severe and refractory swelling.

## 2020-12-14 ENCOUNTER — Other Ambulatory Visit: Payer: Self-pay | Admitting: Cardiovascular Disease

## 2020-12-18 ENCOUNTER — Ambulatory Visit (INDEPENDENT_AMBULATORY_CARE_PROVIDER_SITE_OTHER): Payer: Medicare HMO | Admitting: Vascular Surgery

## 2020-12-18 ENCOUNTER — Other Ambulatory Visit: Payer: Self-pay

## 2020-12-18 ENCOUNTER — Encounter (INDEPENDENT_AMBULATORY_CARE_PROVIDER_SITE_OTHER): Payer: Self-pay

## 2020-12-18 VITALS — BP 126/72 | HR 63 | Resp 16

## 2020-12-18 DIAGNOSIS — I890 Lymphedema, not elsewhere classified: Secondary | ICD-10-CM

## 2020-12-18 DIAGNOSIS — I872 Venous insufficiency (chronic) (peripheral): Secondary | ICD-10-CM | POA: Diagnosis not present

## 2020-12-18 DIAGNOSIS — L97221 Non-pressure chronic ulcer of left calf limited to breakdown of skin: Secondary | ICD-10-CM

## 2020-12-18 NOTE — Progress Notes (Signed)
History of Present Illness  There is no documented history at this time  Assessments & Plan   1. Venous insufficiency of both lower extremities   2. Lymphedema      Additional instructions  Subjective:  Patient presents with venous ulcer of the Left lower extremity.    Procedure:  3 layer unna wrap was placed Left lower extremity.   Plan:   Follow up in one week.

## 2020-12-25 ENCOUNTER — Ambulatory Visit (INDEPENDENT_AMBULATORY_CARE_PROVIDER_SITE_OTHER): Payer: Medicare HMO | Admitting: Nurse Practitioner

## 2020-12-25 ENCOUNTER — Other Ambulatory Visit: Payer: Self-pay

## 2020-12-25 DIAGNOSIS — L97221 Non-pressure chronic ulcer of left calf limited to breakdown of skin: Secondary | ICD-10-CM | POA: Diagnosis not present

## 2020-12-25 NOTE — Progress Notes (Signed)
History of Present Illness  There is no documented history at this time  Assessments & Plan   There are no diagnoses linked to this encounter.    Additional instructions  Subjective:  Patient presents with venous ulcer of the Left lower extremity.    Procedure:  3 layer unna wrap was placed Left lower extremity.   Plan:   Follow up in one week. Pt refused to have BIL wraps done do to previous pain in Rt leg

## 2020-12-26 ENCOUNTER — Encounter (INDEPENDENT_AMBULATORY_CARE_PROVIDER_SITE_OTHER): Payer: Self-pay | Admitting: Nurse Practitioner

## 2021-01-01 ENCOUNTER — Encounter (INDEPENDENT_AMBULATORY_CARE_PROVIDER_SITE_OTHER): Payer: Self-pay | Admitting: Nurse Practitioner

## 2021-01-01 ENCOUNTER — Ambulatory Visit (INDEPENDENT_AMBULATORY_CARE_PROVIDER_SITE_OTHER): Payer: Medicare HMO | Admitting: Nurse Practitioner

## 2021-01-01 ENCOUNTER — Other Ambulatory Visit: Payer: Self-pay

## 2021-01-01 VITALS — BP 118/68 | HR 68 | Ht 69.0 in | Wt 244.0 lb

## 2021-01-01 DIAGNOSIS — M79605 Pain in left leg: Secondary | ICD-10-CM

## 2021-01-01 DIAGNOSIS — I890 Lymphedema, not elsewhere classified: Secondary | ICD-10-CM

## 2021-01-01 DIAGNOSIS — L97221 Non-pressure chronic ulcer of left calf limited to breakdown of skin: Secondary | ICD-10-CM

## 2021-01-01 DIAGNOSIS — I10 Essential (primary) hypertension: Secondary | ICD-10-CM | POA: Diagnosis not present
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
## 2021-01-08 ENCOUNTER — Other Ambulatory Visit: Payer: Self-pay

## 2021-01-08 ENCOUNTER — Ambulatory Visit (INDEPENDENT_AMBULATORY_CARE_PROVIDER_SITE_OTHER): Payer: Medicare HMO | Admitting: Nurse Practitioner

## 2021-01-08 DIAGNOSIS — L97221 Non-pressure chronic ulcer of left calf limited to breakdown of skin: Secondary | ICD-10-CM

## 2021-01-08 NOTE — Progress Notes (Signed)
History of Present Illness  There is no documented history at this time  Assessments & Plan   There are no diagnoses linked to this encounter.    Additional instructions  Subjective:  Patient presents with venous ulcer of the Left lower extremity.    Procedure:  3 layer unna wrap was placed Left lower extremity.   Plan:   Follow up in one week.  

## 2021-01-13 ENCOUNTER — Encounter (INDEPENDENT_AMBULATORY_CARE_PROVIDER_SITE_OTHER): Payer: Self-pay | Admitting: Nurse Practitioner

## 2021-01-15 ENCOUNTER — Ambulatory Visit (INDEPENDENT_AMBULATORY_CARE_PROVIDER_SITE_OTHER): Payer: Medicare HMO | Admitting: Nurse Practitioner

## 2021-01-15 ENCOUNTER — Other Ambulatory Visit: Payer: Self-pay

## 2021-01-15 ENCOUNTER — Encounter (INDEPENDENT_AMBULATORY_CARE_PROVIDER_SITE_OTHER): Payer: Self-pay

## 2021-01-15 VITALS — BP 133/80 | HR 64 | Resp 16

## 2021-01-15 DIAGNOSIS — L97221 Non-pressure chronic ulcer of left calf limited to breakdown of skin: Secondary | ICD-10-CM

## 2021-01-15 NOTE — Progress Notes (Signed)
History of Present Illness  There is no documented history at this time  Assessments & Plan   There are no diagnoses linked to this encounter.    Additional instructions  Subjective:  Patient presents with venous ulcer of the Left lower extremity.    Procedure:  3 layer unna wrap was placed Left lower extremity.   Plan:   Follow up in one week.  Aquacel was applied to left lower extremity wound

## 2021-01-16 ENCOUNTER — Encounter: Payer: Medicare HMO | Attending: Internal Medicine | Admitting: Internal Medicine

## 2021-01-16 DIAGNOSIS — G629 Polyneuropathy, unspecified: Secondary | ICD-10-CM | POA: Diagnosis not present

## 2021-01-16 DIAGNOSIS — Z853 Personal history of malignant neoplasm of breast: Secondary | ICD-10-CM | POA: Insufficient documentation

## 2021-01-16 DIAGNOSIS — I110 Hypertensive heart disease with heart failure: Secondary | ICD-10-CM | POA: Diagnosis not present

## 2021-01-16 DIAGNOSIS — I87332 Chronic venous hypertension (idiopathic) with ulcer and inflammation of left lower extremity: Secondary | ICD-10-CM | POA: Diagnosis not present

## 2021-01-16 DIAGNOSIS — L97828 Non-pressure chronic ulcer of other part of left lower leg with other specified severity: Secondary | ICD-10-CM | POA: Diagnosis not present

## 2021-01-16 DIAGNOSIS — I509 Heart failure, unspecified: Secondary | ICD-10-CM | POA: Diagnosis not present

## 2021-01-16 DIAGNOSIS — I890 Lymphedema, not elsewhere classified: Secondary | ICD-10-CM | POA: Insufficient documentation

## 2021-01-18 ENCOUNTER — Telehealth (INDEPENDENT_AMBULATORY_CARE_PROVIDER_SITE_OTHER): Payer: Self-pay

## 2021-01-18 ENCOUNTER — Other Ambulatory Visit: Payer: Self-pay | Admitting: Oncology

## 2021-01-18 NOTE — Telephone Encounter (Signed)
Patient has being coming in the office for unna wraps and has started receiving care with [facility] wound care. I spoke with Eulogio Ditch NP and she recommended for the patient to continue care with wound care. When the patient wound has completely healed she can follow up with our office for her leg swelling. Patient upcoming appointments has been cancel and patient was made aware.

## 2021-01-18 NOTE — Progress Notes (Signed)
SHLANDA, CHOJNACKI ([id number]) Visit Report for 01/16/2021 Allergy List Details Patient Name: [name], [name] Date of Service: 01/16/2021 2:30 PM Medical Record Number: [id number] Patient Account Number: [id number] Date of Birth/Sex: 1945-11-11 (75 y.o. Female) Treating RN: Dolan Amen Primary Care Breane Grunwald: [name] Other Clinician: Referring Sophia Cubero: [name] Treating Tahisha Hakim/Extender: Ricard Dillon Weeks in Treatment: 0 Allergies Active Allergies No Known Drug Allergies Allergy Notes Electronic Signature(s) Signed: 01/17/2021 5:06:40 PM By: Georges Mouse, Minus Breeding RN Entered By: Georges Mouse, Minus Breeding on 01/16/2021 14:49:25 [name] ([id number]) -------------------------------------------------------------------------------- Arrival Information Details Patient Name: [name] Date of Service: 01/16/2021 2:30 PM Medical Record Number: [id number] Patient Account Number: [id number] Date of Birth/Sex: 08/07/1946 (75 y.o. Female) Treating RN: Dolan Amen Primary Care Makih Stefanko: [name] Other Clinician: Referring Millie Forde: [name] Treating Teja Costen/Extender: Tito Dine in Treatment: 0 Visit Information Patient Arrived: Walker Arrival Time: 14:44 Accompanied By: self Transfer Assistance: None Patient Identification Verified: Yes Secondary Verification Process Completed: Yes Patient Has Alerts: Yes Patient Alerts: NOT DIABETIC History Since Last Visit Has Dressing in Place as Prescribed: Yes Electronic Signature(s) Signed: 01/17/2021 5:06:40 PM By: Georges Mouse, Minus Breeding RN Entered By: Georges Mouse, Minus Breeding on 01/16/2021 15:14:44 [name] ([id number]) -------------------------------------------------------------------------------- Clinic Level of Care Assessment Details Patient Name: [name] Date of Service: 01/16/2021 2:30 PM Medical Record Number: [id number] Patient Account Number: [id number] Date of  Birth/Sex: 23-Feb-1946 (75 y.o. Female) Treating RN: Cornell Barman Primary Care Feven Alderfer: [name] Other Clinician: Referring Faithann Natal: [name] Treating Acelyn Basham/Extender: Tito Dine in Treatment: 0 Clinic Level of Care Assessment Items TOOL 1 Quantity Score []  - Use when EandM and Procedure is performed on INITIAL visit 0 ASSESSMENTS - Nursing Assessment / Reassessment X - General Physical Exam (combine w/ comprehensive assessment (listed just below) when performed on new 1 20 pt. evals) X- 1 25 Comprehensive Assessment (HX, ROS, Risk Assessments, Wounds Hx, etc.) ASSESSMENTS - Wound and Skin Assessment / Reassessment []  - Dermatologic / Skin Assessment (not related to wound area) 0 ASSESSMENTS - Ostomy and/or Continence Assessment and Care []  - Incontinence Assessment and Management 0 []  - 0 Ostomy Care Assessment and Management (repouching, etc.) PROCESS - Coordination of Care X - Simple Patient / Family Education for ongoing care 1 15 []  - 0 Complex (extensive) Patient / Family Education for ongoing care X- 1 10 Staff obtains Consents, Records, Test Results / Process Orders []  - 0 Staff telephones HHA, Nursing Homes / Clarify orders / etc []  - 0 Routine Transfer to another Facility (non-emergent condition) []  - 0 Routine Hospital Admission (non-emergent condition) X- 1 15 New Admissions / Biomedical engineer / Ordering NPWT, Apligraf, etc. []  - 0 Emergency Hospital Admission (emergent condition) PROCESS - Special Needs []  - Pediatric / Minor Patient Management 0 []  - 0 Isolation Patient Management []  - 0 Hearing / Language / Visual special needs []  - 0 Assessment of Community assistance (transportation, D/C planning, etc.) []  - 0 Additional assistance / Altered mentation []  - 0 Support Surface(s) Assessment (bed, cushion, seat, etc.) INTERVENTIONS - Miscellaneous []  - External ear exam 0 []  - 0 Patient Transfer (multiple staff / Civil Service fast streamer  / Similar devices) []  - 0 Simple Staple / Suture removal (25 or less) []  - 0 Complex Staple / Suture removal (26 or more) []  - 0 Hypo/Hyperglycemic Management (do not check if billed separately) []  - 0 Ankle / Brachial Index (ABI) - do not check if billed separately Has the patient been seen at the hospital within  the last three years: Yes Total Score: 85 Level Of Care: New/Established - Level 3 ESMA, KILTS ([id number]) Electronic Signature(s) Signed: 01/16/2021 6:06:01 PM By: Gretta Cool, BSN, RN, CWS, Kim RN, BSN Entered By: Gretta Cool, BSN, RN, CWS, Kim on 01/16/2021 15:35:09 [name] ([id number]) -------------------------------------------------------------------------------- Encounter Discharge Information Details Patient Name: [name] Date of Service: 01/16/2021 2:30 PM Medical Record Number: [id number] Patient Account Number: [id number] Date of Birth/Sex: 09-09-1945 (75 y.o. Female) Treating RN: Cornell Barman Primary Care Antoine Vandermeulen: [name] Other Clinician: Referring Ocean Schildt: [name] Treating Luismario Coston/Extender: Tito Dine in Treatment: 0 Encounter Discharge Information Items Post Procedure Vitals Discharge Condition: Stable Temperature (F): 98.3 Ambulatory Status: Walker Pulse (bpm): 56 Discharge Destination: Home Respiratory Rate (breaths/min): 18 Transportation: Other Blood Pressure (mmHg): 115/70 Accompanied By: self Schedule Follow-up Appointment: Yes Clinical Summary of Care: Electronic Signature(s) Signed: 01/16/2021 6:06:01 PM By: Gretta Cool, BSN, RN, CWS, Kim RN, BSN Entered By: Gretta Cool, BSN, RN, CWS, Kim on 01/16/2021 15:55:22 [name] ([id number]) -------------------------------------------------------------------------------- Lower Extremity Assessment Details Patient Name: [name] Date of Service: 01/16/2021 2:30 PM Medical Record Number: [id number] Patient Account Number: [id number] Date of Birth/Sex: Jun 25, 1946  (75 y.o. Female) Treating RN: Dolan Amen Primary Care Rhianon Zabawa: [name] Other Clinician: Referring Karagan Lehr: [name] Treating Milessa Hogan/Extender: Ricard Dillon Weeks in Treatment: 0 Edema Assessment Assessed: [Left: Yes] [Right: Yes] Edema: [Left: Yes] [Right: Yes] Calf Left: Right: Point of Measurement: 31 cm From Medial Instep 37.2 cm 41 cm Ankle Left: Right: Point of Measurement: 10 cm From Medial Instep 26.5 cm 27.7 cm Knee To Floor Left: Right: From Medial Instep 39 cm 39 cm Vascular Assessment Pulses: Dorsalis Pedis Palpable: [Left:Yes] [Right:Yes] Doppler Audible: [Left:Yes] [Right:Yes] Posterior Tibial Palpable: [Left:No Yes] [Right:No Yes] Electronic Signature(s) Signed: 01/17/2021 5:06:40 PM By: Georges Mouse, Minus Breeding RN Entered By: Georges Mouse, Minus Breeding on 01/16/2021 15:13:27 [name] ([id number]) -------------------------------------------------------------------------------- Multi Wound Chart Details Patient Name: [name] Date of Service: 01/16/2021 2:30 PM Medical Record Number: [id number] Patient Account Number: [id number] Date of Birth/Sex: 1946/04/08 (75 y.o. Female) Treating RN: Cornell Barman Primary Care Bunnie Rehberg: [name] Other Clinician: Referring Oletta Buehring: [name] Treating Verdie Wilms/Extender: Tito Dine in Treatment: 0 Vital Signs Height(in): 69 Pulse(bpm): 1 Weight(lbs): 244 Blood Pressure(mmHg): 115/70 Body Mass Index(BMI): 36 Temperature(F): 98.3 Respiratory Rate(breaths/min): 18 Photos: [N/A:N/A] Wound Location: Left, Lateral Lower Leg N/A N/A Wounding Event: Gradually Appeared N/A N/A Primary Etiology: Venous Leg Ulcer N/A N/A Comorbid History: Cataracts, Lymphedema, Sleep N/A N/A Apnea, Congestive Heart Failure, Hypertension, Osteoarthritis, Neuropathy, Received Chemotherapy, Received Radiation Date Acquired: 09/08/2020 N/A N/A Weeks of Treatment: 0 N/A N/A Wound Status: Open N/A  N/A Measurements L x W x D (cm) 2x3x0.1 N/A N/A Area (cm) : 4.712 N/A N/A Volume (cm) : 0.471 N/A N/A Classification: Full Thickness Without Exposed N/A N/A Support Structures Exudate Amount: Medium N/A N/A Exudate Type: Serosanguineous N/A N/A Exudate Color: red, brown N/A N/A Granulation Amount: Small (1-33%) N/A N/A Granulation Quality: Pink N/A N/A Necrotic Amount: Large (67-100%) N/A N/A Exposed Structures: Fat Layer (Subcutaneous Tissue): N/A N/A Yes Fascia: No Tendon: No Muscle: No Joint: No Bone: No Epithelialization: None N/A N/A Debridement: Debridement - Excisional N/A N/A Pre-procedure Verification/Time 15:31 N/A N/A Out Taken: Tissue Debrided: Subcutaneous, Slough N/A N/A Level: Skin/Subcutaneous Tissue N/A N/A Debridement Area (sq cm): 6 N/A N/A Instrument: Curette N/A N/A Bleeding: Minimum N/A N/A Hemostasis Achieved: Pressure N/A N/A Debridement Treatment Procedure was tolerated well N/A N/A Response: Post Debridement 3x2x0.2 N/A N/A Measurements L x W  x D (cm) MARINA, BOERNER ([id number]) Post Debridement Volume: 0.942 N/A N/A (cm) Procedures Performed: Debridement N/A N/A Treatment Notes Electronic Signature(s) Signed: 01/16/2021 4:52:48 PM By: Linton Ham MD Entered By: Linton Ham on 01/16/2021 15:44:17 [name] ([id number]) -------------------------------------------------------------------------------- Multi-Disciplinary Care Plan Details Patient Name: [name] Date of Service: 01/16/2021 2:30 PM Medical Record Number: [id number] Patient Account Number: [id number] Date of Birth/Sex: May 11, 1946 (75 y.o. Female) Treating RN: Cornell Barman Primary Care Kavari Parrillo: [name] Other Clinician: Referring Persephonie Hegwood: [name] Treating Kel Senn/Extender: Tito Dine in Treatment: 0 Active Inactive Medication Nursing Diagnoses: Knowledge deficit related to medication safety: actual or  potential Goals: Patient/caregiver will demonstrate understanding of all current medications Date Initiated: 01/16/2021 Target Resolution Date: 01/16/2021 Goal Status: Active Patient/caregiver will demonstrate understanding of new oral/IV medications prescribed at the [facility] (topical prescriptions are covered under the skin breakdown problem) Date Initiated: 01/16/2021 Target Resolution Date: 01/16/2021 Goal Status: Active Interventions: Assess for medication contraindications each visit where new medications are prescribed Notes: Orientation to the Wound Care Program Nursing Diagnoses: Knowledge deficit related to the wound healing center program Goals: Patient/caregiver will verbalize understanding of the [place] Date Initiated: 01/16/2021 Target Resolution Date: 01/16/2021 Goal Status: Active Interventions: Provide education on orientation to the wound center Notes: Venous Leg Ulcer Nursing Diagnoses: Actual venous Insuffiency (use after diagnosis is confirmed) Goals: Patient will maintain optimal edema control Date Initiated: 01/16/2021 Target Resolution Date: 01/30/2021 Goal Status: Active Patient/caregiver will verbalize understanding of disease process and disease management Date Initiated: 01/16/2021 Target Resolution Date: 01/30/2021 Goal Status: Active Verify adequate tissue perfusion prior to therapeutic compression application Date Initiated: 01/16/2021 Target Resolution Date: 01/16/2021 Goal Status: Active Interventions: Assess peripheral edema status every visit. Treatment Activities: Therapeutic compression applied : 01/16/2021 TATUM, CORL ([id number]) Notes: Wound/Skin Impairment Nursing Diagnoses: Impaired tissue integrity Goals: Patient/caregiver will verbalize understanding of skin care regimen Date Initiated: 01/16/2021 Target Resolution Date: 01/16/2021 Goal Status: Active Ulcer/skin breakdown will have a volume reduction of 30% by  week 4 Date Initiated: 01/16/2021 Target Resolution Date: 02/16/2021 Goal Status: Active Ulcer/skin breakdown will have a volume reduction of 50% by week 8 Date Initiated: 01/16/2021 Target Resolution Date: 03/18/2021 Goal Status: Active Interventions: Assess ulceration(s) every visit Treatment Activities: Topical wound management initiated : 01/16/2021 Notes: Electronic Signature(s) Signed: 01/16/2021 6:06:01 PM By: Gretta Cool, BSN, RN, CWS, Kim RN, BSN Entered By: Gretta Cool, BSN, RN, CWS, Kim on 01/16/2021 15:31:07 [name] ([id number]) -------------------------------------------------------------------------------- Non-Wound Condition Assessment Details Patient Name: [name] Date of Service: 01/16/2021 2:30 PM Medical Record Number: [id number] Patient Account Number: [id number] Date of Birth/Sex: 1946-06-05 (75 y.o. Female) Treating RN: Cornell Barman Primary Care Sharmel Ballantine: [name] Other Clinician: Referring Adrain Butrick: [name] Treating Connie Hilgert/Extender: Ricard Dillon Weeks in Treatment: 0 Non-Wound Condition: Condition: Lymphedema Location: Leg Side: Bilateral Electronic Signature(s) Signed: 01/16/2021 6:06:01 PM By: Gretta Cool, BSN, RN, CWS, Kim RN, BSN Entered By: Gretta Cool, BSN, RN, CWS, Kim on 01/16/2021 15:38:36 [name] ([id number]) -------------------------------------------------------------------------------- Pain Assessment Details Patient Name: [name] Date of Service: 01/16/2021 2:30 PM Medical Record Number: [id number] Patient Account Number: [id number] Date of Birth/Sex: 07-03-1946 (75 y.o. Female) Treating RN: Dolan Amen Primary Care Ivey Nembhard: [name] Other Clinician: Referring Coriana Angello: [name] Treating Lanaiya Lantry/Extender: Tito Dine in Treatment: 0 Active Problems Location of Pain Severity and Description of Pain Patient Has Paino Yes Site Locations Pain Location: Pain in Ulcers Duration of the  Pain. Constant / Intermittento Constant Rate the pain. Current Pain Level: 9 Pain Management  and Medication Current Pain Management: Electronic Signature(s) Signed: 01/17/2021 5:06:40 PM By: Georges Mouse, Minus Breeding RN Entered By: Georges Mouse, Minus Breeding on 01/16/2021 14:45:07 [name] ([id number]) -------------------------------------------------------------------------------- Patient/Caregiver Education Details Patient Name: [name] Date of Service: 01/16/2021 2:30 PM Medical Record Number: [id number] Patient Account Number: [id number] Date of Birth/Gender: 1946-02-11 (75 y.o. Female) Treating RN: Cornell Barman Primary Care Physician: [name] Other Clinician: Referring Physician: [name] Treating Physician/Extender: Tito Dine in Treatment: 0 Education Assessment Education Provided To: Patient Education Topics Provided Venous: Handouts: Controlling Swelling with Multilayered Compression Wraps Methods: Demonstration, Explain/Verbal Responses: State content correctly Welcome To [facility] [place]: Handouts: Welcome To [facility] Methods: Demonstration, Explain/Verbal Responses: State content correctly Wound Debridement: Handouts: Wound Debridement Methods: Demonstration, Explain/Verbal Responses: State content correctly Wound/Skin Impairment: Handouts: Caring for Your Ulcer Methods: Demonstration, Explain/Verbal Responses: State content correctly Electronic Signature(s) Signed: 01/16/2021 6:06:01 PM By: Gretta Cool, BSN, RN, CWS, Kim RN, BSN Entered By: Gretta Cool, BSN, RN, CWS, Kim on 01/16/2021 15:36:10 [name] ([id number]) -------------------------------------------------------------------------------- Wound Assessment Details Patient Name: [name] Date of Service: 01/16/2021 2:30 PM Medical Record Number: [id number] Patient Account Number: [id number] Date of Birth/Sex: 1945/09/24 (75 y.o. Female) Treating RN:  Dolan Amen Primary Care Maryjayne Kleven: [name] Other Clinician: Referring Hazelgrace Bonham: [name] Treating Tilda Samudio/Extender: Ricard Dillon Weeks in Treatment: 0 Wound Status Wound Number: 2 Primary Venous Leg Ulcer Etiology: Wound Location: Left, Lateral Lower Leg Wound Open Wounding Event: Gradually Appeared Status: Date Acquired: 09/08/2020 Comorbid Cataracts, Lymphedema, Sleep Apnea, Congestive Heart Weeks Of Treatment: 0 History: Failure, Hypertension, Osteoarthritis, Neuropathy, Clustered Wound: No Received Chemotherapy, Received Radiation Photos Wound Measurements Length: (cm) 2 Width: (cm) 3 Depth: (cm) 0.1 Area: (cm) 4.712 Volume: (cm) 0.471 % Reduction in Area: % Reduction in Volume: Epithelialization: None Tunneling: No Undermining: No Wound Description Classification: Full Thickness Without Exposed Support Structu Exudate Amount: Medium Exudate Type: Serosanguineous Exudate Color: red, brown res Foul Odor After Cleansing: No Slough/Fibrino Yes Wound Bed Granulation Amount: Small (1-33%) Exposed Structure Granulation Quality: Pink Fascia Exposed: No Necrotic Amount: Large (67-100%) Fat Layer (Subcutaneous Tissue) Exposed: Yes Necrotic Quality: Adherent Slough Tendon Exposed: No Muscle Exposed: No Joint Exposed: No Bone Exposed: No Treatment Notes Wound #2 (Lower Leg) Wound Laterality: Left, Lateral Cleanser Peri-Wound Care Topical Primary Dressing SHAINDEL, SWEETEN ([id number]) IODOFLEX 0.9% Cadexomer Iodine Pad Discharge Instruction: Apply Iodoflex to wound bed only as directed. Secondary Dressing Xtrasorb Medium 4x5 (in/in) Discharge Instruction: Apply to wound as directed. Do not cut. Secured With Compression Wrap Profore Lite LF 3 Multilayer Compression Bandaging System Discharge Instruction: Apply 3 multi-layer wrap as prescribed. Compression Stockings Add-Ons Electronic Signature(s) Signed: 01/17/2021 5:06:40 PM By: Georges Mouse, Minus Breeding RN Entered By: Georges Mouse, Minus Breeding on 01/16/2021 15:09:03 [name] ([id number]) -------------------------------------------------------------------------------- Vitals Details Patient Name: [name] Date of Service: 01/16/2021 2:30 PM Medical Record Number: [id number] Patient Account Number: [id number] Date of Birth/Sex: 10-18-1945 (75 y.o. Female) Treating RN: Dolan Amen Primary Care Kylee Nardozzi: [name] Other Clinician: Referring Val Farnam: [name] Treating Dawn Convery/Extender: Tito Dine in Treatment: 0 Vital Signs Time Taken: 14:45 Temperature (F): 98.3 Height (in): 69 Pulse (bpm): 56 Source: Stated Respiratory Rate (breaths/min): 18 Weight (lbs): 244 Blood Pressure (mmHg): 115/70 Source: Stated Reference Range: 80 - 120 mg / dl Body Mass Index (BMI): 36 Notes pt unsteady, unable to obtain wt/ht in office d/t this Electronic Signature(s) Signed: 01/17/2021 5:06:40 PM By: Georges Mouse, Minus Breeding RN Entered By: Georges Mouse, Minus Breeding on 01/16/2021 14:49:13

## 2021-01-18 NOTE — Progress Notes (Signed)
[name], [name] ([id number]) Visit Report for 01/16/2021 Abuse/Suicide Risk Screen Details Patient Name: [name], [name] Date of Service: 01/16/2021 2:30 PM Medical Record Number: [id number] Patient Account Number: [id number] Date of Birth/Sex: Dec 20, 1945 (75 y.o. Female) Treating RN: [name] Primary Care [name]: [name] Other Clinician: Referring [name]: [name] Treating [name]/Extender: [name] Weeks in Treatment: 0 Abuse/Suicide Risk Screen Items Answer ABUSE RISK SCREEN: Has anyone close to you tried to hurt or harm you recentlyo No Do you feel uncomfortable with anyone in your familyo No Has anyone forced you do things that you didnot want to doo No Electronic Signature(s) Signed: 01/17/2021 5:06:40 PM By: [name], [name] Breeding RN Entered By: [name], [name] on 01/16/2021 14:54:45 [name] ([id number]) -------------------------------------------------------------------------------- Activities of Daily Living Details Patient Name: [name] Date of Service: 01/16/2021 2:30 PM Medical Record Number: [id number] Patient Account Number: [id number] Date of Birth/Sex: Apr 10, 1946 (75 y.o. Female) Treating RN: [name] Primary Care [name]: [name] Other Clinician: Referring [name]: [name] Treating [name]/Extender: [name] in Treatment: 0 Activities of Daily Living Items Answer Activities of Daily Living (Please select one for each item) Drive Automobile Not Able Take Medications Completely Able Use Telephone Completely Able Care for Appearance Not Able Use Toilet Not Able Bath / Shower Need Assistance Dress Self Need Assistance Feed Self Completely Able Walk Need Assistance Get In / Out Bed Need Assistance Housework Need Assistance Prepare Meals Need Assistance Handle Money Need Assistance Shop for Self Need Assistance Electronic Signature(s) Signed: 01/17/2021 5:06:40 PM By:  [name], [name] Breeding RN Entered By: [name], [name] on 01/16/2021 14:55:32 [name] ([id number]) -------------------------------------------------------------------------------- Education Screening Details Patient Name: [name] Date of Service: 01/16/2021 2:30 PM Medical Record Number: [id number] Patient Account Number: [id number] Date of Birth/Sex: May 16, 1946 (75 y.o. Female) Treating RN: [name] Primary Care [name]: [name] Other Clinician: Referring [name]: [name] Treating [name]/Extender: [name] in Treatment: 0 Primary Learner Assessed: Patient Learning Preferences/Education Level/Primary Language Learning Preference: Explanation, Demonstration Highest Education Level: High School Preferred Language: English Cognitive Barrier Language Barrier: No Translator Needed: No Memory Deficit: No Emotional Barrier: No Cultural/Religious Beliefs Affecting Medical Care: No Physical Barrier Impaired Vision: Yes Glasses Impaired Hearing: No Decreased Hand dexterity: No Knowledge/Comprehension Knowledge Level: Medium Comprehension Level: Medium Ability to understand written instructions: Medium Ability to understand verbal instructions: Medium Motivation Anxiety Level: Calm Cooperation: Cooperative Education Importance: Acknowledges Need Interest in Health Problems: Asks Questions Perception: Coherent Willingness to Engage in Self-Management High Activities: Readiness to Engage in Self-Management High Activities: Electronic Signature(s) Signed: 01/17/2021 5:06:40 PM By: [name], [name] Breeding RN Entered By: [name], [name] on 01/16/2021 14:56:03 [name] ([id number]) -------------------------------------------------------------------------------- Fall Risk Assessment Details Patient Name: [name] Date of Service: 01/16/2021 2:30 PM Medical Record Number: [id number] Patient Account  Number: [id number] Date of Birth/Sex: [birth year] (75 y.o. Female) Treating RN: [name] Primary Care [name]: [name] Other Clinician: Referring [name]: [name] Treating [name]/Extender: [name] in Treatment: 0 Fall Risk Assessment Items Have you had 2 or more falls in the last 12 monthso 0 Yes Have you had any fall that resulted in injury in the last 12 monthso 0 Yes FALLS RISK SCREEN History of falling - immediate or within 3 months 25 Yes Secondary diagnosis (Do you have 2 or more medical diagnoseso) 15 Yes Ambulatory aid None/bed rest/wheelchair/nurse 0 No Crutches/cane/walker 15 Yes Furniture 0 No Intravenous therapy Access/Saline/Heparin Lock 0 No Gait/Transferring Normal/ bed rest/ wheelchair 0 No Weak (  short steps with or without shuffle, stooped but able to lift head while walking, may 10 Yes seek support from furniture) Impaired (short steps with shuffle, may have difficulty arising from chair, head down, impaired 0 No balance) Mental Status Oriented to own ability 0 Yes Electronic Signature(s) Signed: 01/17/2021 5:06:40 PM By: [name], [name] Breeding RN Entered By: [name], Kenia on 01/16/2021 14:56:17 [name] ([id number]) -------------------------------------------------------------------------------- Foot Assessment Details Patient Name: [name] Date of Service: 01/16/2021 2:30 PM Medical Record Number: [id number] Patient Account Number: [id number] Date of Birth/Sex: April 21, 1946 (75 y.o. Female) Treating RN: [name] Primary Care [name]: [name] Other Clinician: Referring [name]: [name] Treating [name]/Extender: [name] in Treatment: 0 Foot Assessment Items Site Locations + = Sensation present, - = Sensation absent, C = Callus, U = Ulcer R = Redness, W = Warmth, M = Maceration, PU = Pre-ulcerative lesion F = Fissure, S = Swelling, D = Dryness Assessment Right:  Left: Other Deformity: No No Prior Foot Ulcer: No No Prior Amputation: No No Charcot Joint: No No Ambulatory Status: Ambulatory With Help Assistance Device: Walker Gait: Administrator, arts) Signed: 01/17/2021 5:06:40 PM By: [name], [name] Breeding RN Entered By: [name], [name] on 01/16/2021 15:05:24 [name] ([id number]) -------------------------------------------------------------------------------- Nutrition Risk Screening Details Patient Name: [name] Date of Service: 01/16/2021 2:30 PM Medical Record Number: [id number] Patient Account Number: [id number] Date of Birth/Sex: 07-19-1946 (75 y.o. Female) Treating RN: [name] Primary Care Ilamae Geng: [name] Other Clinician: Referring Hessie Varone: [name] Treating Dilynn Munroe/Extender: [name] Weeks in Treatment: 0 Height (in): 69 Weight (lbs): 244 Body Mass Index (BMI): 36 Nutrition Risk Screening Items Score Screening NUTRITION RISK SCREEN: I have an illness or condition that made me change the kind and/or amount of food I eat 2 Yes I eat fewer than two meals per day 0 No I eat few fruits and vegetables, or milk products 0 No I have three or more drinks of beer, liquor or wine almost every day 0 No I have tooth or mouth problems that make it hard for me to eat 0 No I don't always have enough money to buy the food I need 0 No I eat alone most of the time 0 No I take three or more different prescribed or over-the-counter drugs a day 1 Yes Without wanting to, I have lost or gained 10 pounds in the last six months 0 No I am not always physically able to shop, cook and/or feed myself 0 No Nutrition Protocols Good Risk Protocol Moderate Risk Protocol 0 Provide education on nutrition High Risk Proctocol Risk Level: Moderate Risk Score: 3 Electronic Signature(s) Signed: 01/17/2021 5:06:40 PM By: [name], [name] Breeding RN Entered By: [name], [name] on 01/16/2021  14:56:51

## 2021-01-18 NOTE — Progress Notes (Signed)
[name], [name] ([id number]) Visit Report for 01/16/2021 Debridement Details Patient Name: [name], [name] Date of Service: 01/16/2021 2:30 PM Medical Record Number: [id number] Patient Account Number: [id number] Date of Birth/Sex: 08-05-1946 (75 y.o. Female) Treating RN: Cornell Barman Primary Care Provider: [name] Other Clinician: Referring Provider: [name] Treating Provider/Extender: Tito Dine in Treatment: 0 Debridement Performed for Wound #2 Left,Lateral Lower Leg Assessment: Performed By: Physician Ricard Dillon, MD Debridement Type: Debridement Severity of Tissue Pre Debridement: Fat layer exposed Level of Consciousness (Pre- Awake and Alert procedure): Pre-procedure Verification/Time Out Yes - 15:31 Taken: Total Area Debrided (L x W): 2 (cm) x 3 (cm) = 6 (cm) Tissue and other material Viable, Non-Viable, Slough, Subcutaneous, Slough debrided: Level: Skin/Subcutaneous Tissue Debridement Description: Excisional Instrument: Curette Bleeding: Minimum Hemostasis Achieved: Pressure Response to Treatment: Procedure was tolerated well Level of Consciousness (Post- Awake and Alert procedure): Post Debridement Measurements of Total Wound Length: (cm) 3 Width: (cm) 2 Depth: (cm) 0.2 Volume: (cm) 0.942 Character of Wound/Ulcer Post Debridement: Stable Severity of Tissue Post Debridement: Fat layer exposed Post Procedure Diagnosis Same as Pre-procedure Electronic Signature(s) Signed: 01/16/2021 4:52:48 PM By: Linton Ham MD Signed: 01/16/2021 6:06:01 PM By: Gretta Cool, BSN, RN, CWS, Kim RN, BSN Entered By: Linton Ham on 01/16/2021 15:44:28 [name] ([id number]) -------------------------------------------------------------------------------- HPI Details Patient Name: [name] Date of Service: 01/16/2021 2:30 PM Medical Record Number: [id number] Patient Account Number: [id number] Date of Birth/Sex: 04-03-1946 (75 y.o.  Female) Treating RN: Cornell Barman Primary Care Provider: [name] Other Clinician: Referring Provider: [name] Treating Provider/Extender: Tito Dine in Treatment: 0 History of Present Illness HPI Description: 03/15/2020 upon evaluation today patient presents today for initial evaluation here in our clinic concerning a wound that is on the left posterior lower extremity. Unfortunately this has been giving the patient some discomfort at this point which she notes has been affected her pretty much daily. The patient does have a history of venous insufficiency, hypertension, congestive heart failure, and a history of breast cancer. Fortunately there does not appear to be evidence of active infection at this time which is great news. No fevers, chills, nausea, vomiting, or diarrhea. Wound is not extremely large but does have some slough covering the surface of the wound. This is going require sharp debridement today. 03/15/2020 upon evaluation today patient appears to be doing fairly well in regard to her wound. She does have some slough noted on the surface of the wound currently but I do believe the Iodoflex has been beneficial over the past week. There is no signs of active infection at this time which is great news and overall very pleased with the progress. No fevers, chills, nausea, vomiting, or diarrhea. 04/02/2020 upon evaluation today patient appears to be doing a little better in regard to her wound size wise this is not tremendously smaller she does have some slough buildup on the surface of the wound. With that being said this is going require some sharp debridement today to clear away some of the necrotic debris. Fortunately there is no evidence of active infection at this time. No fevers, chills, nausea, vomiting, or diarrhea. 04/10/2020 on evaluation today patient appears to be doing well with regard to her ulcer which is measuring somewhat smaller today. She actually has  more granulation tissue noted which is also good news is no need for sharp debridement today. Fortunately there is no evidence of infection either which is also excellent. No fevers, chills, nausea, vomiting, or diarrhea. 04/26/2020  on evaluation today patient's wound actually is showing signs of good improvement which is great news. There does not appear to be any evidence of active infection. I do believe she is tolerating the collagen at this point. 05/03/2020 upon evaluation today patient's wound actually showed signs of good granulation at this time there does not appear to be any evidence of active infection which is great news and overall very pleased with where things stand. With that being said I do believe that the patient is can require some sharp debridement today but fortunately nothing too significant. 05/11/20 on evaluation today patient appears to be doing well in regard to her leg ulcer. She's been tolerating the dressing changes without complication. Fortunately there is no signs of active infection at this time. Overall I'm very pleased with where things stand. 05/18/2020 upon evaluation today patient's wound is showing signs of improvement is very dry and I think the collagen for the most part just dried to the wound bed. I am going to clean this away with sharp debridement in order to get this under control in my opinion. 05/25/2020 upon evaluation today patient actually appears to be doing well in regard to her leg ulcer. In fact upon inspection it appears she could potentially be completely healed but that is not guaranteed based on what we are seeing. There does not appear to be any signs of active infection which is great news. 05/31/2020 on evaluation today patient appears to be doing well with regard to her wounds. She in fact appears to be almost completely healed on the left leg there is just a very small opening remaining point 1 06/08/2020 upon evaluation today patient appears  to be doing excellent in regard to her leg ulcer on the left in fact it appears to be that she is completely healed as of today. I see no signs of active infection at this time which is great news. No fevers, chills, nausea, vomiting, or diarrhea. READMISSION 01/16/2021 Patient that we have had in this clinic with a wound on the left posterior calf in the summer 2021 extending into October. She was felt to have chronic venous insufficiency. Per the patient's description she was discharged with what sounds like external compression stockings although she could not afford the "$75 per leg". She therefore did not get anything. Apparently her wound that she has currently is been in existence since January. She has been followed at vein and vascular with Unna boots and I think calcium alginate. She had ABIs done on 06/25/2020 that showed an noncompressible ABI on the right leg and the left leg but triphasic waveforms with good great toe pressures and a TBI of 0.90 on the right and 0.91 on the left Surprisingly the patient also had venous reflux studies that were really unremarkable. This included no evidence of DVTs or SVTs but there was no evidence of deep venous insufficiency or superficial venous insufficiency in the greater saphenous or short saphenous veins bilaterally. I would wonder about more central venous issues or perhaps this is all lymphedema Electronic Signature(s) Signed: 01/16/2021 4:52:48 PM By: Linton Ham MD Entered By: Linton Ham on 01/16/2021 15:58:37 [name] ([id number]) -------------------------------------------------------------------------------- Physical Exam Details Patient Name: [name] Date of Service: 01/16/2021 2:30 PM Medical Record Number: [id number] Patient Account Number: [id number] Date of Birth/Sex: 05-19-1946 (75 y.o. Female) Treating RN: Cornell Barman Primary Care Provider: [name] Other Clinician: Referring Provider: [name] Treating Provider/Extender: Ricard Dillon Weeks in Treatment: 0 Constitutional  Sitting or standing Blood Pressure is within target range for patient.. Pulse regular and within target range for patient.Marland Kitchen Respirations regular, non- labored and within target range.. Temperature is normal and within the target range for the patient.Marland Kitchen appears in no distress. Respiratory Respiratory effort is easy and symmetric bilaterally. Rate is normal at rest and on room air.. Cardiovascular Pedal pulses are palpable. Severe bilateral edema.. Notes Wound exam; the area in question is on the left lateral lower leg under illumination tightly adherent fibrinous debris which I debrided with a #3 curette. Unfortunately this did not come easily off and she is going to require further debridements both in terms of the dressings we choose and probably ongoing mechanical debridements. She also did not have very good edema control although her edema was slightly better on the left than the right Electronic Signature(s) Signed: 01/16/2021 4:52:48 PM By: Linton Ham MD Entered By: Linton Ham on 01/16/2021 16:00:01 [name] ([id number]) -------------------------------------------------------------------------------- Physician Orders Details Patient Name: [name] Date of Service: 01/16/2021 2:30 PM Medical Record Number: [id number] Patient Account Number: [id number] Date of Birth/Sex: May 03, 1946 (75 y.o. Female) Treating RN: Cornell Barman Primary Care Provider: [name] Other Clinician: Referring Provider: [name] Treating Provider/Extender: Tito Dine in Treatment: 0 Verbal / Phone Orders: No Diagnosis Coding Follow-up Appointments Wound #2 Left,Lateral Lower Leg o Return Appointment in 1 week. Bathing/ Shower/ Hygiene Wound #2 Left,Lateral Lower Leg o May shower with wound dressing protected with water repellent cover or cast protector. Anesthetic (Use  'Patient Medications' Section for Anesthetic Order Entry) Wound #2 Left,Lateral Lower Leg o Lidocaine applied to wound bed Edema Control - Lymphedema / Segmental Compressive Device / Other Bilateral Lower Extremities o Optional: One layer of unna paste to top of compression wrap (to act as an anchor). o 3 Layer Compression System for Lymphedema. - right o Elevate, Exercise Daily and Avoid Standing for Long Periods of Time. o Elevate legs to the level of the heart and pump ankles as often as possible o Elevate leg(s) parallel to the floor when sitting. o Compression Pump: Use compression pump on left lower extremity for 60 minutes, twice daily. - You may pump over wraps o Compression Pump: Use compression pump on right lower extremity for 60 minutes, twice daily. - You may pump over wraps Additional Orders / Instructions Wound #2 Left,Lateral Lower Leg o Follow Nutritious Diet and Increase Protein Intake - Increase protein intake. Wound Treatment Wound #2 - Lower Leg Wound Laterality: Left, Lateral Primary Dressing: IODOFLEX 0.9% Cadexomer Iodine Pad Discharge Instructions: Apply Iodoflex to wound bed only as directed. Secondary Dressing: Xtrasorb Medium 4x5 (in/in) Discharge Instructions: Apply to wound as directed. Do not cut. Compression Wrap: Profore Lite LF 3 Multilayer Compression Bandaging System Discharge Instructions: Apply 3 multi-layer wrap as prescribed. Electronic Signature(s) Signed: 01/16/2021 4:52:48 PM By: Linton Ham MD Signed: 01/16/2021 6:06:01 PM By: Gretta Cool, BSN, RN, CWS, Kim RN, BSN Entered By: Gretta Cool, BSN, RN, CWS, Kim on 01/16/2021 15:40:50 [name], [name] ([id number]) -------------------------------------------------------------------------------- Problem List Details Patient Name: [name] Date of Service: 01/16/2021 2:30 PM Medical Record Number: [id number] Patient Account Number: [id number] Date of Birth/Sex: [birth year] (75 y.o.  Female) Treating RN: Cornell Barman Primary Care Provider: [name] Other Clinician: Referring Provider: [name] Treating Provider/Extender: Tito Dine in Treatment: 0 Active Problems ICD-10 Encounter Code Description Active Date MDM Diagnosis I89.0 Lymphedema, not elsewhere classified 01/16/2021 No Yes I87.332 Chronic venous hypertension (idiopathic) with ulcer and inflammation of 01/16/2021 No Yes  left lower extremity L97.828 Non-pressure chronic ulcer of other part of left lower leg with other 01/16/2021 No Yes specified severity Inactive Problems Resolved Problems Electronic Signature(s) Signed: 01/16/2021 4:52:48 PM By: Linton Ham MD Entered By: Linton Ham on 01/16/2021 15:48:14 [name] ([id number]) -------------------------------------------------------------------------------- Progress Note Details Patient Name: [name] Date of Service: 01/16/2021 2:30 PM Medical Record Number: [id number] Patient Account Number: [id number] Date of Birth/Sex: 1945/12/24 (75 y.o. Female) Treating RN: Cornell Barman Primary Care Provider: [name] Other Clinician: Referring Provider: [name] Treating Provider/Extender: Ricard Dillon Weeks in Treatment: 0 Subjective History of Present Illness (HPI) 03/15/2020 upon evaluation today patient presents today for initial evaluation here in our clinic concerning a wound that is on the left posterior lower extremity. Unfortunately this has been giving the patient some discomfort at this point which she notes has been affected her pretty much daily. The patient does have a history of venous insufficiency, hypertension, congestive heart failure, and a history of breast cancer. Fortunately there does not appear to be evidence of active infection at this time which is great news. No fevers, chills, nausea, vomiting, or diarrhea. Wound is not extremely large but does have some slough covering the surface of  the wound. This is going require sharp debridement today. 03/15/2020 upon evaluation today patient appears to be doing fairly well in regard to her wound. She does have some slough noted on the surface of the wound currently but I do believe the Iodoflex has been beneficial over the past week. There is no signs of active infection at this time which is great news and overall very pleased with the progress. No fevers, chills, nausea, vomiting, or diarrhea. 04/02/2020 upon evaluation today patient appears to be doing a little better in regard to her wound size wise this is not tremendously smaller she does have some slough buildup on the surface of the wound. With that being said this is going require some sharp debridement today to clear away some of the necrotic debris. Fortunately there is no evidence of active infection at this time. No fevers, chills, nausea, vomiting, or diarrhea. 04/10/2020 on evaluation today patient appears to be doing well with regard to her ulcer which is measuring somewhat smaller today. She actually has more granulation tissue noted which is also good news is no need for sharp debridement today. Fortunately there is no evidence of infection either which is also excellent. No fevers, chills, nausea, vomiting, or diarrhea. 04/26/2020 on evaluation today patient's wound actually is showing signs of good improvement which is great news. There does not appear to be any evidence of active infection. I do believe she is tolerating the collagen at this point. 05/03/2020 upon evaluation today patient's wound actually showed signs of good granulation at this time there does not appear to be any evidence of active infection which is great news and overall very pleased with where things stand. With that being said I do believe that the patient is can require some sharp debridement today but fortunately nothing too significant. 05/11/20 on evaluation today patient appears to be doing well in  regard to her leg ulcer. She's been tolerating the dressing changes without complication. Fortunately there is no signs of active infection at this time. Overall I'm very pleased with where things stand. 05/18/2020 upon evaluation today patient's wound is showing signs of improvement is very dry and I think the collagen for the most part just dried to the wound bed. I am going to clean this  away with sharp debridement in order to get this under control in my opinion. 05/25/2020 upon evaluation today patient actually appears to be doing well in regard to her leg ulcer. In fact upon inspection it appears she could potentially be completely healed but that is not guaranteed based on what we are seeing. There does not appear to be any signs of active infection which is great news. 05/31/2020 on evaluation today patient appears to be doing well with regard to her wounds. She in fact appears to be almost completely healed on the left leg there is just a very small opening remaining point 1 06/08/2020 upon evaluation today patient appears to be doing excellent in regard to her leg ulcer on the left in fact it appears to be that she is completely healed as of today. I see no signs of active infection at this time which is great news. No fevers, chills, nausea, vomiting, or diarrhea. READMISSION 01/16/2021 Patient that we have had in this clinic with a wound on the left posterior calf in the summer 2021 extending into October. She was felt to have chronic venous insufficiency. Per the patient's description she was discharged with what sounds like external compression stockings although she could not afford the "$75 per leg". She therefore did not get anything. Apparently her wound that she has currently is been in existence since January. She has been followed at vein and vascular with Unna boots and I think calcium alginate. She had ABIs done on 06/25/2020 that showed an noncompressible ABI on the right leg and  the left leg but triphasic waveforms with good great toe pressures and a TBI of 0.90 on the right and 0.91 on the left Surprisingly the patient also had venous reflux studies that were really unremarkable. This included no evidence of DVTs or SVTs but there was no evidence of deep venous insufficiency or superficial venous insufficiency in the greater saphenous or short saphenous veins bilaterally. I would wonder about more central venous issues or perhaps this is all lymphedema Patient History Information obtained from Patient. Allergies No Known Drug Allergies Family History Cancer - Siblings, ZIHAN, GARST ([id number]) No family history of Stroke. Social History Former smoker, Marital Status - Widowed, Alcohol Use - Never, Drug Use - No History, Caffeine Use - Never. Medical History Eyes Patient has history of Cataracts - left Denies history of Glaucoma, Optic Neuritis Ear/Nose/Mouth/Throat Denies history of Chronic sinus problems/congestion, Middle ear problems Hematologic/Lymphatic Patient has history of Lymphedema Denies history of Anemia, Hemophilia, Human Immunodeficiency Virus, Sickle Cell Disease Respiratory Patient has history of Sleep Apnea - CPAP Denies history of Aspiration, Asthma, Chronic Obstructive Pulmonary Disease (COPD), Pneumothorax, Tuberculosis Cardiovascular Patient has history of Congestive Heart Failure, Hypertension Denies history of Angina, Arrhythmia, Coronary Artery Disease, Deep Vein Thrombosis, Hypotension, Myocardial Infarction, Peripheral Arterial Disease, Peripheral Venous Disease, Phlebitis, Vasculitis Gastrointestinal Denies history of Cirrhosis , Colitis, Crohn s, Hepatitis A, Hepatitis B, Hepatitis C Endocrine Denies history of Type I Diabetes, Type II Diabetes Genitourinary Denies history of End Stage Renal Disease Immunological Denies history of Lupus Erythematosus, Raynaud s, Scleroderma Integumentary (Skin) Denies history of  History of Burn, History of pressure wounds Musculoskeletal Patient has history of Osteoarthritis Denies history of Gout, Rheumatoid Arthritis, Osteomyelitis Neurologic Patient has history of Neuropathy - hands Denies history of Dementia, Quadriplegia, Paraplegia, Seizure Disorder Oncologic Patient has history of Received Chemotherapy - Chemo pills for right breast, Received Radiation Psychiatric Denies history of Anorexia/bulimia, Confinement Anxiety Review of Systems (ROS) Constitutional  Symptoms (General Health) Complains or has symptoms of Fatigue. Denies complaints or symptoms of Fever, Chills, Marked Weight Change. Eyes Denies complaints or symptoms of Dry Eyes, Vision Changes, Glasses / Contacts. Ear/Nose/Mouth/Throat Denies complaints or symptoms of Difficult clearing ears, Sinusitis. Hematologic/Lymphatic Denies complaints or symptoms of Bleeding / Clotting Disorders, Human Immunodeficiency Virus. Respiratory Denies complaints or symptoms of Chronic or frequent coughs, Shortness of Breath. Cardiovascular Complains or has symptoms of LE edema. Denies complaints or symptoms of Chest pain. Gastrointestinal Denies complaints or symptoms of Frequent diarrhea, Nausea, Vomiting. Endocrine Denies complaints or symptoms of Hepatitis, Thyroid disease, Polydypsia (Excessive Thirst). Genitourinary Complains or has symptoms of Incontinence/dribbling. Denies complaints or symptoms of Kidney failure/ Dialysis. Immunological Denies complaints or symptoms of Hives, Itching. Integumentary (Skin) Complains or has symptoms of Wounds, Swelling. Denies complaints or symptoms of Bleeding or bruising tendency, Breakdown. Musculoskeletal Complains or has symptoms of Muscle Weakness. Denies complaints or symptoms of Muscle Pain. Neurologic Denies complaints or symptoms of Numbness/parasthesias, Focal/Weakness. Psychiatric Denies complaints or symptoms of Anxiety, Claustrophobia. [name],  [name] ([id number]) Objective Constitutional Sitting or standing Blood Pressure is within target range for patient.. Pulse regular and within target range for patient.Marland Kitchen Respirations regular, non- labored and within target range.. Temperature is normal and within the target range for the patient.Marland Kitchen appears in no distress. Vitals Time Taken: 2:45 PM, Height: 69 in, Source: Stated, Weight: 244 lbs, Source: Stated, BMI: 36, Temperature: 98.3 F, Pulse: 56 bpm, Respiratory Rate: 18 breaths/min, Blood Pressure: 115/70 mmHg. General Notes: pt unsteady, unable to obtain wt/ht in office d/t this Respiratory Respiratory effort is easy and symmetric bilaterally. Rate is normal at rest and on room air.. Cardiovascular Pedal pulses are palpable. Severe bilateral edema.. General Notes: Wound exam; the area in question is on the left lateral lower leg under illumination tightly adherent fibrinous debris which I debrided with a #3 curette. Unfortunately this did not come easily off and she is going to require further debridements both in terms of the dressings we choose and probably ongoing mechanical debridements. She also did not have very good edema control although her edema was slightly better on the left than the right Integumentary (Hair, Skin) Wound #2 status is Open. Original cause of wound was Gradually Appeared. The date acquired was: 09/08/2020. The wound is located on the Left,Lateral Lower Leg. The wound measures 2cm length x 3cm width x 0.1cm depth; 4.712cm^2 area and 0.471cm^3 volume. There is Fat Layer (Subcutaneous Tissue) exposed. There is no tunneling or undermining noted. There is a medium amount of serosanguineous drainage noted. There is small (1-33%) pink granulation within the wound bed. There is a large (67-100%) amount of necrotic tissue within the wound bed including Adherent Slough. 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 There was a Excisional Skin/Subcutaneous Tissue Debridement with a total area of 6 sq cm performed by Ricard Dillon, MD. With the following instrument(s): Curette to remove Viable and Non-Viable tissue/material. Material removed includes Subcutaneous Tissue and Slough and. No specimens were taken. A time out was conducted at 15:31, prior to the start of the procedure. A Minimum amount of bleeding was controlled with Pressure. The procedure was tolerated well.  Post Debridement Measurements: 3cm length x 2cm width x 0.2cm depth; 0.942cm^3 volume. Character of Wound/Ulcer Post Debridement is stable. Severity of Tissue Post Debridement is: Fat layer exposed. Post procedure Diagnosis Wound #2: Same as Pre-Procedure Plan Follow-up Appointments: Wound #2 Left,Lateral Lower Leg: [name], [name] ([id number]) Return Appointment in 1 week. Bathing/ Shower/ Hygiene: Wound #2 Left,Lateral Lower Leg: May shower with wound dressing protected with water repellent cover or cast protector. Anesthetic (Use 'Patient Medications' Section for Anesthetic Order Entry): Wound #2 Left,Lateral Lower Leg: Lidocaine applied to wound bed Edema Control - Lymphedema / Segmental Compressive Device / Other: Optional: One layer of unna paste to top of compression wrap (to act as an anchor). 3 Layer Compression System for Lymphedema. - right Elevate, Exercise Daily and Avoid Standing for Long Periods of Time. Elevate legs to the level of the heart and pump ankles as often as possible Elevate leg(s) parallel to the floor when sitting. Compression Pump: Use compression pump on left  lower extremity for 60 minutes, twice daily. - You may pump over wraps Compression Pump: Use compression pump on right lower extremity for 60 minutes, twice daily. - You may pump over wraps Additional Orders / Instructions: Wound #2 Left,Lateral Lower Leg: Follow Nutritious Diet and Increase Protein Intake - Increase protein intake. WOUND #2: - Lower Leg Wound Laterality: Left, Lateral Primary Dressing: IODOFLEX 0.9% Cadexomer Iodine Pad Discharge Instructions: Apply Iodoflex to wound bed only as directed. Secondary Dressing: Xtrasorb Medium 4x5 (in/in) Discharge Instructions: Apply to wound as directed. Do not cut. Compression Wrap: Profore Lite LF 3 Multilayer Compression Bandaging System Discharge Instructions: Apply 3 multi-layer wrap as prescribed. 1. We put Iodoflex on the wounds and put her in 3 layer compression on the left. I think she could tolerate 4-layer compression 2. Because the amount of edema in the right leg we put that in compression as well 3. I think she could tolerate 4-layer compression based on her arterial studies she does not have an arterial issue and pressure in her great toes is really quite high 4. The patient could not afford what ever external compression garments we ordered last time we will have to keep that in mind going forward Electronic Signature(s) Signed: 01/16/2021 4:52:48 PM By: Linton Ham MD Entered By: Linton Ham on 01/16/2021 16:01:05 [name] ([id number]) -------------------------------------------------------------------------------- ROS/PFSH Details Patient Name: [name] Date of Service: 01/16/2021 2:30 PM Medical Record Number: [id number] Patient Account Number: [id number] Date of Birth/Sex: 1945/12/30 (75 y.o. Female) Treating RN: Dolan Amen Primary Care Provider: [name] Other Clinician: Referring Provider: [name] Treating Provider/Extender: Tito Dine in Treatment: 0 Information  Obtained From Patient Constitutional Symptoms (General Health) Complaints and Symptoms: Positive for: Fatigue Negative for: Fever; Chills; Marked Weight Change Eyes Complaints and Symptoms: Negative for: Dry Eyes; Vision Changes; Glasses / Contacts Medical History: Positive for: Cataracts - left Negative for: Glaucoma; Optic Neuritis Ear/Nose/Mouth/Throat Complaints and Symptoms: Negative for: Difficult clearing ears; Sinusitis Medical History: Negative for: Chronic sinus problems/congestion; Middle ear problems Hematologic/Lymphatic Complaints and Symptoms: Negative for: Bleeding / Clotting Disorders; Human Immunodeficiency Virus Medical History: Positive for: Lymphedema Negative for: Anemia; Hemophilia; Human Immunodeficiency Virus; Sickle Cell Disease Respiratory Complaints and Symptoms: Negative for: Chronic or frequent coughs; Shortness of Breath Medical History: Positive for: Sleep Apnea - CPAP Negative for: Aspiration; Asthma; Chronic Obstructive Pulmonary Disease (COPD); Pneumothorax; Tuberculosis Cardiovascular Complaints and Symptoms: Positive for: LE edema Negative for: Chest pain Medical History: Positive for: Congestive Heart Failure; Hypertension Negative for: Angina;  Arrhythmia; Coronary Artery Disease; Deep Vein Thrombosis; Hypotension; Myocardial Infarction; Peripheral Arterial Disease; Peripheral Venous Disease; Phlebitis; Vasculitis Gastrointestinal Complaints and Symptoms: Negative for: Frequent diarrhea; Nausea; Vomiting Medical HistoryCASSIDEY, [name] ([id number]) Negative for: Cirrhosis ; Colitis; Crohnos; Hepatitis A; Hepatitis B; Hepatitis C Endocrine Complaints and Symptoms: Negative for: Hepatitis; Thyroid disease; Polydypsia (Excessive Thirst) Medical History: Negative for: Type I Diabetes; Type II Diabetes Genitourinary Complaints and Symptoms: Positive for: Incontinence/dribbling Negative for: Kidney failure/ Dialysis Medical  History: Negative for: End Stage Renal Disease Immunological Complaints and Symptoms: Negative for: Hives; Itching Medical History: Negative for: Lupus Erythematosus; Raynaudos; Scleroderma Integumentary (Skin) Complaints and Symptoms: Positive for: Wounds; Swelling Negative for: Bleeding or bruising tendency; Breakdown Medical History: Negative for: History of Burn; History of pressure wounds Musculoskeletal Complaints and Symptoms: Positive for: Muscle Weakness Negative for: Muscle Pain Medical History: Positive for: Osteoarthritis Negative for: Gout; Rheumatoid Arthritis; Osteomyelitis Neurologic Complaints and Symptoms: Negative for: Numbness/parasthesias; Focal/Weakness Medical History: Positive for: Neuropathy - hands Negative for: Dementia; Quadriplegia; Paraplegia; Seizure Disorder Psychiatric Complaints and Symptoms: Negative for: Anxiety; Claustrophobia Medical History: Negative for: Anorexia/bulimia; Confinement Anxiety Oncologic Medical History: Positive for: Received Chemotherapy - Chemo pills for right breast; Received Radiation HBO Extended History Items [name], [name] ([id number]) Eyes: Cataracts Immunizations Pneumococcal Vaccine: Received Pneumococcal Vaccination: No Implantable Devices None Family and Social History Cancer: Yes - Siblings; Stroke: No; Former smoker; Marital Status - Widowed; Alcohol Use: Never; Drug Use: No History; Caffeine Use: Never Electronic Signature(s) Signed: 01/16/2021 4:52:48 PM By: Linton Ham MD Signed: 01/17/2021 5:06:40 PM By: Georges Mouse, Minus Breeding RN Entered By: Georges Mouse, Minus Breeding on 01/16/2021 14:54:40 [name] ([id number]) -------------------------------------------------------------------------------- [place] Details Patient Name: [name] Date of Service: 01/16/2021 Medical Record Number: [id number] Patient Account Number: [id number] Date of Birth/Sex: [birth year] (75 y.o.  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 Facility Procedures CPT4 Code: [id number] Description: [id number] - WOUND CARE VISIT-LEV 3 EST PT Modifier: Quantity: 1 CPT4 Code: [id number] Description: [id number] - DEB SUBQ TISSUE 20 SQ CM/< Modifier: Quantity: 1 CPT4 Code: Description: ICD-10 Diagnosis Description L97.828 Non-pressure chronic ulcer of other part of left lower leg with other speci Modifier: fied severity Quantity: Physician Procedures CPT4 Code Description: [id number] - WC PHYS LEVEL 4 - EST PT Modifier: 25 Quantity: 1 CPT4 Code Description: ICD-10 Diagnosis Description I87.332 Chronic venous hypertension (idiopathic) with ulcer and inflammation of lef I89.0 Lymphedema, not elsewhere classified Modifier: t lower extremity Quantity: CPT4 Code Description: [id number] - WC PHYS SUBQ TISS 20 SQ CM Modifier: Quantity: 1 CPT4 Code Description: ICD-10 Diagnosis Description L97.828 Non-pressure chronic ulcer of other part of left lower leg with other speci Modifier: fied severity Quantity: Electronic Signature(s) Signed: 01/16/2021 4:52:48 PM By: Linton Ham MD Entered By: Linton Ham on 01/16/2021 16:01:40

## 2021-01-20 ENCOUNTER — Encounter (INDEPENDENT_AMBULATORY_CARE_PROVIDER_SITE_OTHER): Payer: Self-pay | Admitting: Nurse Practitioner

## 2021-01-22 ENCOUNTER — Encounter (INDEPENDENT_AMBULATORY_CARE_PROVIDER_SITE_OTHER): Payer: Medicare HMO

## 2021-01-25 ENCOUNTER — Other Ambulatory Visit: Payer: Self-pay

## 2021-01-25 ENCOUNTER — Encounter: Payer: Medicare HMO | Admitting: Physician Assistant

## 2021-01-25 DIAGNOSIS — L97828 Non-pressure chronic ulcer of other part of left lower leg with other specified severity: Secondary | ICD-10-CM | POA: Diagnosis not present

## 2021-01-25 NOTE — Progress Notes (Addendum)
[name], MARCHESE ([id number]) Visit Report for 01/25/2021 Arrival Information Details Patient Name: [name], [name] Date of Service: 01/25/2021 1:30 PM Medical Record Number: [id number] Patient Account Number: [id number] Date of Birth/Sex: 29-Nov-1945 (75 y.o. F) Treating RN: Carlene Coria Primary Care Elka Satterfield: [name] Other Clinician: Referring Josephus Harriger: [name] Treating Jeury Mcnab/Extender: Skipper Cliche in Treatment: 1 Visit Information History Since Last Visit Added or deleted any medications: No Patient Arrived: Wheel Chair Had a fall or experienced change in No Arrival Time: 13:44 activities of daily living that may affect Accompanied By: self risk of falls: Transfer Assistance: None Hospitalized since last visit: No Patient Identification Verified: Yes Pain Present Now: No Secondary Verification Process Completed: Yes Patient Has Alerts: Yes Patient Alerts: NOT DIABETIC Electronic Signature(s) Signed: 01/31/2021 4:37:07 PM By: Jeanine Luz Entered By: Jeanine Luz on 01/25/2021 13:44:17 [name] ([id number]) -------------------------------------------------------------------------------- Clinic Level of Care Assessment Details Patient Name: [name] Date of Service: 01/25/2021 1:30 PM Medical Record Number: [id number] Patient Account Number: [id number] Date of Birth/Sex: [birth year] (75 y.o. F) Treating RN: Carlene Coria Primary Care Eduard Penkala: [name] Other Clinician: Referring Kimani Hovis: [name] Treating Kenji Mapel/Extender: Skipper Cliche in Treatment: 1 Clinic Level of Care Assessment Items TOOL 1 Quantity Score []  - Use when EandM and Procedure is performed on INITIAL visit 0 ASSESSMENTS - Nursing Assessment / Reassessment []  - General Physical Exam (combine w/ comprehensive assessment (listed just below) when performed on new 0 pt. evals) []  - 0 Comprehensive Assessment (HX, ROS, Risk Assessments, Wounds Hx,  etc.) ASSESSMENTS - Wound and Skin Assessment / Reassessment []  - Dermatologic / Skin Assessment (not related to wound area) 0 ASSESSMENTS - Ostomy and/or Continence Assessment and Care []  - Incontinence Assessment and Management 0 []  - 0 Ostomy Care Assessment and Management (repouching, etc.) PROCESS - Coordination of Care []  - Simple Patient / Family Education for ongoing care 0 []  - 0 Complex (extensive) Patient / Family Education for ongoing care []  - 0 Staff obtains Programmer, systems, Records, Test Results / Process Orders []  - 0 Staff telephones HHA, Nursing Homes / Clarify orders / etc []  - 0 Routine Transfer to another Facility (non-emergent condition) []  - 0 Routine Hospital Admission (non-emergent condition) []  - 0 New Admissions / Biomedical engineer / Ordering NPWT, Apligraf, etc. []  - 0 Emergency Hospital Admission (emergent condition) PROCESS - Special Needs []  - Pediatric / Minor Patient Management 0 []  - 0 Isolation Patient Management []  - 0 Hearing / Language / Visual special needs []  - 0 Assessment of Community assistance (transportation, D/C planning, etc.) []  - 0 Additional assistance / Altered mentation []  - 0 Support Surface(s) Assessment (bed, cushion, seat, etc.) INTERVENTIONS - Miscellaneous []  - External ear exam 0 []  - 0 Patient Transfer (multiple staff / Civil Service fast streamer / Similar devices) []  - 0 Simple Staple / Suture removal (25 or less) []  - 0 Complex Staple / Suture removal (26 or more) []  - 0 Hypo/Hyperglycemic Management (do not check if billed separately) []  - 0 Ankle / Brachial Index (ABI) - do not check if billed separately Has the patient been seen at the hospital within the last three years: Yes Total Score: 0 Level Of Care: ____ [name] ([id number]) Electronic Signature(s) Signed: 01/28/2021 2:18:56 PM By: Carlene Coria RN Entered By: Carlene Coria on 01/25/2021 14:08:50 [name]  ([id number]) -------------------------------------------------------------------------------- Encounter Discharge Information Details Patient Name: [name] Date of Service: 01/25/2021 1:30 PM Medical Record Number: [id number] Patient Account Number: [id number] Date of Birth/Sex: 1946-06-21 (75 y.o.  F) Treating RN: Donnamarie Poag Primary Care Rosetta Rupnow: [name] Other Clinician: Referring Meryle Pugmire: [name] Treating Ilhan Madan/Extender: Skipper Cliche in Treatment: 1 Encounter Discharge Information Items Post Procedure Vitals Discharge Condition: Stable Temperature (F): 99.1 Ambulatory Status: Wheelchair Pulse (bpm): 71 Discharge Destination: Home Respiratory Rate (breaths/min): 18 Transportation: Other Blood Pressure (mmHg): 152/80 Accompanied By: self Schedule Follow-up Appointment: Yes Clinical Summary of Care: Electronic Signature(s) Signed: 01/25/2021 3:58:37 PM By: Donnamarie Poag Entered By: Donnamarie Poag on 01/25/2021 14:24:44 [name] ([id number]) -------------------------------------------------------------------------------- Lower Extremity Assessment Details Patient Name: [name] Date of Service: 01/25/2021 1:30 PM Medical Record Number: [id number] Patient Account Number: [id number] Date of Birth/Sex: [birth year] (75 y.o. F) Treating RN: Carlene Coria Primary Care Zephyr Ridley: [name] Other Clinician: Referring Lennard Capek: [name] Treating Noela Brothers/Extender: Skipper Cliche in Treatment: 1 Edema Assessment Assessed: [Left: No] [Right: No] [Left: Edema] [Right: :] Calf Left: Right: Point of Measurement: From Medial Instep 40 cm 43.5 cm Ankle Left: Right: Point of Measurement: From Medial Instep 29 cm 26 cm Vascular Assessment Pulses: Dorsalis Pedis Palpable: [Left:Yes] [Right:Yes] Electronic Signature(s) Signed: 01/28/2021 2:18:56 PM By: Carlene Coria RN Signed: 01/31/2021 4:37:07 PM By: Jeanine Luz Entered By: Jeanine Luz  on 01/25/2021 13:53:18 [name] ([id number]) -------------------------------------------------------------------------------- Multi Wound Chart Details Patient Name: [name] Date of Service: 01/25/2021 1:30 PM Medical Record Number: [id number] Patient Account Number: [id number] Date of Birth/Sex: 1946-02-02 (75 y.o. F) Treating RN: Carlene Coria Primary Care Kaileigh Viswanathan: [name] Other Clinician: Referring Cathi Hazan: [name] Treating Soua Caltagirone/Extender: Skipper Cliche in Treatment: 1 Vital Signs Height(in): 69 Pulse(bpm): 71 Weight(lbs): 244 Blood Pressure(mmHg): 152/80 Body Mass Index(BMI): 36 Temperature(F): 99.1 Respiratory Rate(breaths/min): 18 Photos: [N/A:N/A] Wound Location: Left, Lateral Lower Leg N/A N/A Wounding Event: Gradually Appeared N/A N/A Primary Etiology: Venous Leg Ulcer N/A N/A Comorbid History: Cataracts, Lymphedema, Sleep N/A N/A Apnea, Congestive Heart Failure, Hypertension, Osteoarthritis, Neuropathy, Received Chemotherapy, Received Radiation Date Acquired: 09/08/2020 N/A N/A Weeks of Treatment: 1 N/A N/A Wound Status: Open N/A N/A Measurements L x W x D (cm) 2x3.5x0.1 N/A N/A Area (cm) : 5.498 N/A N/A Volume (cm) : 0.55 N/A N/A % Reduction in Area: -16.70% N/A N/A % Reduction in Volume: -16.80% N/A N/A Classification: Full Thickness Without Exposed N/A N/A Support Structures Exudate Amount: Medium N/A N/A Exudate Type: Serosanguineous N/A N/A Exudate Color: red, brown N/A N/A Granulation Amount: Small (1-33%) N/A N/A Granulation Quality: Pink N/A N/A Necrotic Amount: Large (67-100%) N/A N/A Exposed Structures: Fat Layer (Subcutaneous Tissue): N/A N/A Yes Fascia: No Tendon: No Muscle: No Joint: No Bone: No Epithelialization: None N/A N/A Treatment Notes Electronic Signature(s) Signed: 01/28/2021 2:18:56 PM By: Carlene Coria RN Entered By: Carlene Coria on 01/25/2021 14:05:11 [name]  ([id number]) -------------------------------------------------------------------------------- Multi-Disciplinary Care Plan Details Patient Name: [name] Date of Service: 01/25/2021 1:30 PM Medical Record Number: [id number] Patient Account Number: [id number] Date of Birth/Sex: 08-28-1946 (75 y.o. F) Treating RN: Carlene Coria Primary Care Levan Aloia: [name] Other Clinician: Referring Sohil Timko: [name] Treating Suheily Birks/Extender: Skipper Cliche in Treatment: 1 Active Inactive Medication Nursing Diagnoses: Knowledge deficit related to medication safety: actual or potential Goals: Patient/caregiver will demonstrate understanding of all current medications Date Initiated: 01/16/2021 Target Resolution Date: 01/16/2021 Goal Status: Active Patient/caregiver will demonstrate understanding of new oral/IV medications prescribed at the [facility] (topical prescriptions are covered under the skin breakdown problem) Date Initiated: 01/16/2021 Target Resolution Date: 01/16/2021 Goal Status: Active Interventions: Assess for medication contraindications each visit where new medications are prescribed Notes: Orientation to the Wound  Care Program Nursing Diagnoses: Knowledge deficit related to the wound healing center program Goals: Patient/caregiver will verbalize understanding of the [place] Date Initiated: 01/16/2021 Target Resolution Date: 01/16/2021 Goal Status: Active Interventions: Provide education on orientation to the wound center Notes: Venous Leg Ulcer Nursing Diagnoses: Actual venous Insuffiency (use after diagnosis is confirmed) Goals: Patient will maintain optimal edema control Date Initiated: 01/16/2021 Target Resolution Date: 01/30/2021 Goal Status: Active Patient/caregiver will verbalize understanding of disease process and disease management Date Initiated: 01/16/2021 Target Resolution Date: 01/30/2021 Goal Status: Active Verify adequate tissue  perfusion prior to therapeutic compression application Date Initiated: 01/16/2021 Date Inactivated: 01/25/2021 Target Resolution Date: 01/16/2021 Goal Status: Met Interventions: Assess peripheral edema status every visit. Treatment Activities: Therapeutic compression applied : 01/16/2021 TALINA, PLEITEZ ([id number]) Notes: Wound/Skin Impairment Nursing Diagnoses: Impaired tissue integrity Goals: Patient/caregiver will verbalize understanding of skin care regimen Date Initiated: 01/16/2021 Target Resolution Date: 02/16/2021 Goal Status: Active Ulcer/skin breakdown will have a volume reduction of 30% by week 4 Date Initiated: 01/16/2021 Target Resolution Date: 02/16/2021 Goal Status: Active Ulcer/skin breakdown will have a volume reduction of 50% by week 8 Date Initiated: 01/16/2021 Target Resolution Date: 03/18/2021 Goal Status: Active Interventions: Assess ulceration(s) every visit Treatment Activities: Topical wound management initiated : 01/16/2021 Notes: Electronic Signature(s) Signed: 01/28/2021 2:18:56 PM By: Carlene Coria RN Entered By: Carlene Coria on 01/25/2021 14:05:02 [name] ([id number]) -------------------------------------------------------------------------------- Pain Assessment Details Patient Name: [name] Date of Service: 01/25/2021 1:30 PM Medical Record Number: [id number] Patient Account Number: [id number] Date of Birth/Sex: 22-Jul-1946 (75 y.o. F) Treating RN: Carlene Coria Primary Care Alexavier Tsutsui: [name] Other Clinician: Referring Sheranda Seabrooks: [name] Treating Caiden Arteaga/Extender: Skipper Cliche in Treatment: 1 Active Problems Location of Pain Severity and Description of Pain Patient Has Paino No Site Locations Rate the pain. Current Pain Level: 0 Pain Management and Medication Current Pain Management: Electronic Signature(s) Signed: 01/28/2021 2:18:56 PM By: Carlene Coria RN Signed: 01/31/2021 4:37:07 PM By: Jeanine Luz Entered By: Jeanine Luz on 01/25/2021 13:44:44 [name] ([id number]) -------------------------------------------------------------------------------- Patient/Caregiver Education Details Patient Name: [name] Date of Service: 01/25/2021 1:30 PM Medical Record Number: [id number] Patient Account Number: [id number] Date of Birth/Gender: May 03, 1946 (75 y.o. F) Treating RN: Carlene Coria Primary Care Physician: [name] Other Clinician: Referring Physician: [name] Treating Physician/Extender: Skipper Cliche in Treatment: 1 Education Assessment Education Provided To: Patient Education Topics Provided Welcome To [facility] [place]: Methods: Explain/Verbal Responses: State content correctly Electronic Signature(s) Signed: 01/28/2021 2:18:56 PM By: Carlene Coria RN Entered By: Carlene Coria on 01/25/2021 14:09:19 [name] ([id number]) -------------------------------------------------------------------------------- Wound Assessment Details Patient Name: [name] Date of Service: 01/25/2021 1:30 PM Medical Record Number: [id number] Patient Account Number: [id number] Date of Birth/Sex: 09/30/1945 (75 y.o. F) Treating RN: Carlene Coria Primary Care Tuwanda Vokes: [name] Other Clinician: Referring Mandy Fitzwater: [name] Treating Sharmaine Bain/Extender: Skipper Cliche in Treatment: 1 Wound Status Wound Number: 2 Primary Venous Leg Ulcer Etiology: Wound Location: Left, Lateral Lower Leg Wound Open Wounding Event: Gradually Appeared Status: Date Acquired: 09/08/2020 Comorbid Cataracts, Lymphedema, Sleep Apnea, Congestive Heart Weeks Of Treatment: 1 History: Failure, Hypertension, Osteoarthritis, Neuropathy, Clustered Wound: No Received Chemotherapy, Received Radiation Photos Wound Measurements Length: (cm) 2 Width: (cm) 3.5 Depth: (cm) 0.1 Area: (cm) 5.498 Volume: (cm) 0.55 % Reduction in Area: -16.7% % Reduction in Volume:  -16.8% Epithelialization: None Tunneling: No Undermining: No Wound Description Classification: Full Thickness Without Exposed Support Structu Exudate Amount: Medium Exudate Type: Serosanguineous Exudate Color: red, brown res Foul Odor  After Cleansing: No Slough/Fibrino Yes Wound Bed Granulation Amount: Small (1-33%) Exposed Structure Granulation Quality: Pink Fascia Exposed: No Necrotic Amount: Large (67-100%) Fat Layer (Subcutaneous Tissue) Exposed: Yes Necrotic Quality: Adherent Slough Tendon Exposed: No Muscle Exposed: No Joint Exposed: No Bone Exposed: No Treatment Notes Wound #2 (Lower Leg) Wound Laterality: Left, Lateral Cleanser Peri-Wound Care Topical Primary Dressing Horine, Rosamund ([id number]) Prisma 4.34 (in) Discharge Instruction: Moisten w/normal saline or sterile water; Cover wound as directed. Do not remove from wound bed. Secondary Dressing Xtrasorb Medium 4x5 (in/in) Discharge Instruction: Apply to wound as directed. Do not cut. Secured With Compression Wrap Profore Lite LF 3 Multilayer Compression Bandaging System Discharge Instruction: Apply 3 multi-layer wrap as prescribed. Compression Stockings Add-Ons Electronic Signature(s) Signed: 01/28/2021 2:18:56 PM By: Carlene Coria RN Signed: 01/31/2021 4:37:07 PM By: Jeanine Luz Entered By: Jeanine Luz on 01/25/2021 13:50:11 [name] ([id number]) -------------------------------------------------------------------------------- Vitals Details Patient Name: [name] Date of Service: 01/25/2021 1:30 PM Medical Record Number: [id number] Patient Account Number: [id number] Date of Birth/Sex: 1945-11-20 (75 y.o. F) Treating RN: Carlene Coria Primary Care Judyth Demarais: [name] Other Clinician: Referring Savvy Peeters: [name] Treating Collin Rengel/Extender: Skipper Cliche in Treatment: 1 Vital Signs Time Taken: 13:44 Temperature (F): 99.1 Height (in): 69 Pulse (bpm): 71 Weight  (lbs): 244 Respiratory Rate (breaths/min): 18 Body Mass Index (BMI): 36 Blood Pressure (mmHg): 152/80 Reference Range: 80 - 120 mg / dl Electronic Signature(s) Signed: 01/31/2021 4:37:07 PM By: Jeanine Luz Entered By: Jeanine Luz on 01/25/2021 13:44:37

## 2021-01-25 NOTE — Progress Notes (Addendum)
[name], [name] ([id number]) Visit Report for 01/25/2021 Chief Complaint Document Details Patient Name: [name], [name] Date of Service: 01/25/2021 1:30 PM Medical Record Number: [id number] Patient Account Number: [id number] Date of Birth/Sex: [birth year] (75 y.o. F) Treating RN: Carlene Coria Primary Care Provider: [name] Other Clinician: Referring Provider: [name] Treating Provider/Extender: Skipper Cliche in Treatment: 1 Information Obtained from: Patient Chief Complaint Left LE Ulcer Electronic Signature(s) Signed: 01/25/2021 1:42:27 PM By: Worthy Keeler PA-C Entered By: Worthy Keeler on 01/25/2021 13:42:27 [name] ([id number]) -------------------------------------------------------------------------------- Debridement Details Patient Name: [name] Date of Service: 01/25/2021 1:30 PM Medical Record Number: [id number] Patient Account Number: [id number] Date of Birth/Sex: 1946/02/07 (75 y.o. F) Treating RN: Carlene Coria Primary Care Provider: [name] Other Clinician: Referring Provider: [name] Treating Provider/Extender: Skipper Cliche in Treatment: 1 Debridement Performed for Wound #2 Left,Lateral Lower Leg Assessment: Performed By: Physician Tommie Sams., PA-C Debridement Type: Debridement Severity of Tissue Pre Debridement: Fat layer exposed Level of Consciousness (Pre- Awake and Alert procedure): Pre-procedure Verification/Time Out Yes - 14:05 Taken: Start Time: 14:05 Pain Control: Lidocaine 4% Topical Solution Total Area Debrided (L x W): 2 (cm) x 3.5 (cm) = 7 (cm) Tissue and other material Viable, Non-Viable, Slough, Subcutaneous, Skin: Dermis , Skin: Epidermis, Slough debrided: Level: Skin/Subcutaneous Tissue Debridement Description: Excisional Instrument: Curette Bleeding: Minimum Hemostasis Achieved: Pressure End Time: 14:06 Procedural Pain: 0 Post Procedural Pain: 0 Response to Treatment: Procedure was  tolerated well Level of Consciousness (Post- Awake and Alert procedure): Post Debridement Measurements of Total Wound Length: (cm) 2 Width: (cm) 3.5 Depth: (cm) 0.1 Volume: (cm) 0.55 Character of Wound/Ulcer Post Debridement: Improved Severity of Tissue Post Debridement: Fat layer exposed Post Procedure Diagnosis Same as Pre-procedure Electronic Signature(s) Signed: 01/25/2021 4:32:16 PM By: Worthy Keeler PA-C Signed: 01/28/2021 2:18:56 PM By: Carlene Coria RN Entered By: Carlene Coria on 01/25/2021 14:06:32 [name] ([id number]) -------------------------------------------------------------------------------- HPI Details Patient Name: [name] Date of Service: 01/25/2021 1:30 PM Medical Record Number: [id number] Patient Account Number: [id number] Date of Birth/Sex: 1946/05/29 (75 y.o. F) Treating RN: Carlene Coria Primary Care Provider: [name] Other Clinician: Referring Provider: [name] Treating Provider/Extender: Skipper Cliche in Treatment: 1 History of Present Illness HPI Description: 03/15/2020 upon evaluation today patient presents today for initial evaluation here in our clinic concerning a wound that is on the left posterior lower extremity. Unfortunately this has been giving the patient some discomfort at this point which she notes has been affected her pretty much daily. The patient does have a history of venous insufficiency, hypertension, congestive heart failure, and a history of breast cancer. Fortunately there does not appear to be evidence of active infection at this time which is great news. No fevers, chills, nausea, vomiting, or diarrhea. Wound is not extremely large but does have some slough covering the surface of the wound. This is going require sharp debridement today. 03/15/2020 upon evaluation today patient appears to be doing fairly well in regard to her wound. She does have some slough noted on the surface of the wound currently  but I do believe the Iodoflex has been beneficial over the past week. There is no signs of active infection at this time which is great news and overall very pleased with the progress. No fevers, chills, nausea, vomiting, or diarrhea. 04/02/2020 upon evaluation today patient appears to be doing a little better in regard to her wound size wise this is not tremendously smaller she does have some slough buildup on  the surface of the wound. With that being said this is going require some sharp debridement today to clear away some of the necrotic debris. Fortunately there is no evidence of active infection at this time. No fevers, chills, nausea, vomiting, or diarrhea. 04/10/2020 on evaluation today patient appears to be doing well with regard to her ulcer which is measuring somewhat smaller today. She actually has more granulation tissue noted which is also good news is no need for sharp debridement today. Fortunately there is no evidence of infection either which is also excellent. No fevers, chills, nausea, vomiting, or diarrhea. 04/26/2020 on evaluation today patient's wound actually is showing signs of good improvement which is great news. There does not appear to be any evidence of active infection. I do believe she is tolerating the collagen at this point. 05/03/2020 upon evaluation today patient's wound actually showed signs of good granulation at this time there does not appear to be any evidence of active infection which is great news and overall very pleased with where things stand. With that being said I do believe that the patient is can require some sharp debridement today but fortunately nothing too significant. 05/11/20 on evaluation today patient appears to be doing well in regard to her leg ulcer. She's been tolerating the dressing changes without complication. Fortunately there is no signs of active infection at this time. Overall I'm very pleased with where things stand. 05/18/2020 upon  evaluation today patient's wound is showing signs of improvement is very dry and I think the collagen for the most part just dried to the wound bed. I am going to clean this away with sharp debridement in order to get this under control in my opinion. 05/25/2020 upon evaluation today patient actually appears to be doing well in regard to her leg ulcer. In fact upon inspection it appears she could potentially be completely healed but that is not guaranteed based on what we are seeing. There does not appear to be any signs of active infection which is great news. 05/31/2020 on evaluation today patient appears to be doing well with regard to her wounds. She in fact appears to be almost completely healed on the left leg there is just a very small opening remaining point 1 06/08/2020 upon evaluation today patient appears to be doing excellent in regard to her leg ulcer on the left in fact it appears to be that she is completely healed as of today. I see no signs of active infection at this time which is great news. No fevers, chills, nausea, vomiting, or diarrhea. READMISSION 01/16/2021 Patient that we have had in this clinic with a wound on the left posterior calf in the summer 2021 extending into October. She was felt to have chronic venous insufficiency. Per the patient's description she was discharged with what sounds like external compression stockings although she could not afford the "$75 per leg". She therefore did not get anything. Apparently her wound that she has currently is been in existence since January. She has been followed at vein and vascular with Unna boots and I think calcium alginate. She had ABIs done on 06/25/2020 that showed an noncompressible ABI on the right leg and the left leg but triphasic waveforms with good great toe pressures and a TBI of 0.90 on the right and 0.91 on the left Surprisingly the patient also had venous reflux studies that were really unremarkable. This included  no evidence of DVTs or SVTs but there was no evidence of deep  venous insufficiency or superficial venous insufficiency in the greater saphenous or short saphenous veins bilaterally. I would wonder about more central venous issues or perhaps this is all lymphedema 01/25/2021 upon evaluation today patient appears to be doing decently well and guard to her wound. The good news is the Iodoflex that is job she saw Dr. Dellia Nims last week for readmission and to be honest I think that this has done extremely well over that week. I do believe that she can tolerate the sharp debridement today which will be good. I think that cleaning off the wound will likely allow [place] to be able to use a different type of dressing I do not think the Iodoflex will even be necessary based on how clean the wound looks. Fortunately there is no sign of active infection at this time which is great news. No fevers, chills, nausea, vomiting, or diarrhea. Electronic Signature(s) ERMINIA, MCNEW ([id number]) Signed: 01/25/2021 2:12:50 PM By: Worthy Keeler PA-C Entered By: Worthy Keeler on 01/25/2021 14:12:50 KIERYN, BURTIS ([id number]) -------------------------------------------------------------------------------- Physical Exam Details Patient Name: [name] Date of Service: 01/25/2021 1:30 PM Medical Record Number: [id number] Patient Account Number: [id number] Date of Birth/Sex: [birth year] (75 y.o. F) Treating RN: Carlene Coria Primary Care Provider: [name] Other Clinician: Referring Provider: [name] Treating Provider/Extender: Skipper Cliche in Treatment: 1 Constitutional Well-nourished and well-hydrated in no acute distress. Respiratory normal breathing without difficulty. Psychiatric this patient is able to make decisions and demonstrates good insight into disease process. Alert and Oriented x 3. pleasant and cooperative. Notes Patient's wound bed required some sharp debridement to clear away  necrotic debris from the surface of the wound she tolerated that today without complication postdebridement the wound bed appears to be doing excellent the surface is nice and clean I do not see any need for Iodoflex to be perfectly honest I think that she would probably do quite well with collagen based on what I am seeing. She is extremely swollen she does have a history of congestive heart failure. I did encourage her to get in touch with her cardiologist. She also did get her lymphedema pumps. Electronic Signature(s) Signed: 01/25/2021 2:13:27 PM By: Worthy Keeler PA-C Entered By: Worthy Keeler on 01/25/2021 14:13:27 [name] ([id number]) -------------------------------------------------------------------------------- Physician Orders Details Patient Name: [name] Date of Service: 01/25/2021 1:30 PM Medical Record Number: [id number] Patient Account Number: [id number] Date of Birth/Sex: Jan 21, 1946 (75 y.o. F) Treating RN: Carlene Coria Primary Care Provider: [name] Other Clinician: Referring Provider: [name] Treating Provider/Extender: Skipper Cliche in Treatment: 1 Verbal / Phone Orders: No Diagnosis Coding ICD-10 Coding Code Description I89.0 Lymphedema, not elsewhere classified I87.332 Chronic venous hypertension (idiopathic) with ulcer and inflammation of left lower extremity L97.828 Non-pressure chronic ulcer of other part of left lower leg with other specified severity Follow-up Appointments Wound #2 Left,Lateral Lower Leg o Return Appointment in 1 week. Bathing/ Shower/ Hygiene Wound #2 Left,Lateral Lower Leg o May shower with wound dressing protected with water repellent cover or cast protector. Anesthetic (Use 'Patient Medications' Section for Anesthetic Order Entry) Wound #2 Left,Lateral Lower Leg o Lidocaine applied to wound bed Edema Control - Lymphedema / Segmental Compressive Device / Other Bilateral Lower Extremities o  Optional: One layer of unna paste to top of compression wrap (to act as an anchor). o 3 Layer Compression System for Lymphedema. - right o Elevate, Exercise Daily and Avoid Standing for Long Periods of Time. o Elevate legs to the level of the heart  and pump ankles as often as possible o Elevate leg(s) parallel to the floor when sitting. o Compression Pump: Use compression pump on left lower extremity for 60 minutes, twice daily. - You may pump over wraps o Compression Pump: Use compression pump on right lower extremity for 60 minutes, twice daily. - You may pump over wraps Additional Orders / Instructions Wound #2 Left,Lateral Lower Leg o Follow Nutritious Diet and Increase Protein Intake - Increase protein intake. Wound Treatment Wound #2 - Lower Leg Wound Laterality: Left, Lateral Primary Dressing: Prisma 4.34 (in) Discharge Instructions: Moisten w/normal saline or sterile water; Cover wound as directed. Do not remove from wound bed. Secondary Dressing: Xtrasorb Medium 4x5 (in/in) Discharge Instructions: Apply to wound as directed. Do not cut. Compression Wrap: Profore Lite LF 3 Multilayer Compression Bandaging System Discharge Instructions: Apply 3 multi-layer wrap as prescribed. Electronic Signature(s) Signed: 01/25/2021 4:32:16 PM By: Worthy Keeler PA-C Signed: 01/28/2021 2:18:56 PM By: Carlene Coria RN Entered By: Carlene Coria on 01/25/2021 14:07:48 [name] ([id number]) -------------------------------------------------------------------------------- Problem List Details Patient Name: [name] Date of Service: 01/25/2021 1:30 PM Medical Record Number: [id number] Patient Account Number: [id number] Date of Birth/Sex: [birth year] (75 y.o. F) Treating RN: Carlene Coria Primary Care Provider: [name] Other Clinician: Referring Provider: [name] Treating Provider/Extender: Skipper Cliche in Treatment: 1 Active Problems ICD-10 Encounter Code  Description Active Date MDM Diagnosis I89.0 Lymphedema, not elsewhere classified 01/16/2021 No Yes I87.332 Chronic venous hypertension (idiopathic) with ulcer and inflammation of 01/16/2021 No Yes left lower extremity L97.828 Non-pressure chronic ulcer of other part of left lower leg with other 01/16/2021 No Yes specified severity Inactive Problems Resolved Problems Electronic Signature(s) Signed: 01/25/2021 1:42:22 PM By: Worthy Keeler PA-C Entered By: Worthy Keeler on 01/25/2021 13:42:22 [name] ([id number]) -------------------------------------------------------------------------------- Progress Note Details Patient Name: [name] Date of Service: 01/25/2021 1:30 PM Medical Record Number: [id number] Patient Account Number: [id number] Date of Birth/Sex: 1945-11-25 (75 y.o. F) Treating RN: Carlene Coria Primary Care Provider: [name] Other Clinician: Referring Provider: [name] Treating Provider/Extender: Skipper Cliche in Treatment: 1 Subjective Chief Complaint Information obtained from Patient Left LE Ulcer History of Present Illness (HPI) 03/15/2020 upon evaluation today patient presents today for initial evaluation here in our clinic concerning a wound that is on the left posterior lower extremity. Unfortunately this has been giving the patient some discomfort at this point which she notes has been affected her pretty much daily. The patient does have a history of venous insufficiency, hypertension, congestive heart failure, and a history of breast cancer. Fortunately there does not appear to be evidence of active infection at this time which is great news. No fevers, chills, nausea, vomiting, or diarrhea. Wound is not extremely large but does have some slough covering the surface of the wound. This is going require sharp debridement today. 03/15/2020 upon evaluation today patient appears to be doing fairly well in regard to her wound. She does have some  slough noted on the surface of the wound currently but I do believe the Iodoflex has been beneficial over the past week. There is no signs of active infection at this time which is great news and overall very pleased with the progress. No fevers, chills, nausea, vomiting, or diarrhea. 04/02/2020 upon evaluation today patient appears to be doing a little better in regard to her wound size wise this is not tremendously smaller she does have some slough buildup on the surface of the wound. With that being said this  is going require some sharp debridement today to clear away some of the necrotic debris. Fortunately there is no evidence of active infection at this time. No fevers, chills, nausea, vomiting, or diarrhea. 04/10/2020 on evaluation today patient appears to be doing well with regard to her ulcer which is measuring somewhat smaller today. She actually has more granulation tissue noted which is also good news is no need for sharp debridement today. Fortunately there is no evidence of infection either which is also excellent. No fevers, chills, nausea, vomiting, or diarrhea. 04/26/2020 on evaluation today patient's wound actually is showing signs of good improvement which is great news. There does not appear to be any evidence of active infection. I do believe she is tolerating the collagen at this point. 05/03/2020 upon evaluation today patient's wound actually showed signs of good granulation at this time there does not appear to be any evidence of active infection which is great news and overall very pleased with where things stand. With that being said I do believe that the patient is can require some sharp debridement today but fortunately nothing too significant. 05/11/20 on evaluation today patient appears to be doing well in regard to her leg ulcer. She's been tolerating the dressing changes without complication. Fortunately there is no signs of active infection at this time. Overall I'm very  pleased with where things stand. 05/18/2020 upon evaluation today patient's wound is showing signs of improvement is very dry and I think the collagen for the most part just dried to the wound bed. I am going to clean this away with sharp debridement in order to get this under control in my opinion. 05/25/2020 upon evaluation today patient actually appears to be doing well in regard to her leg ulcer. In fact upon inspection it appears she could potentially be completely healed but that is not guaranteed based on what we are seeing. There does not appear to be any signs of active infection which is great news. 05/31/2020 on evaluation today patient appears to be doing well with regard to her wounds. She in fact appears to be almost completely healed on the left leg there is just a very small opening remaining point 1 06/08/2020 upon evaluation today patient appears to be doing excellent in regard to her leg ulcer on the left in fact it appears to be that she is completely healed as of today. I see no signs of active infection at this time which is great news. No fevers, chills, nausea, vomiting, or diarrhea. READMISSION 01/16/2021 Patient that we have had in this clinic with a wound on the left posterior calf in the summer 2021 extending into October. She was felt to have chronic venous insufficiency. Per the patient's description she was discharged with what sounds like external compression stockings although she could not afford the "$75 per leg". She therefore did not get anything. Apparently her wound that she has currently is been in existence since January. She has been followed at vein and vascular with Unna boots and I think calcium alginate. She had ABIs done on 06/25/2020 that showed an noncompressible ABI on the right leg and the left leg but triphasic waveforms with good great toe pressures and a TBI of 0.90 on the right and 0.91 on the left Surprisingly the patient also had venous reflux  studies that were really unremarkable. This included no evidence of DVTs or SVTs but there was no evidence of deep venous insufficiency or superficial venous insufficiency in the greater saphenous  or short saphenous veins bilaterally. I would wonder about more central venous issues or perhaps this is all lymphedema 01/25/2021 upon evaluation today patient appears to be doing decently well and guard to her wound. The good news is the Iodoflex that is job she saw Dr. Dellia Nims last week for readmission and to be honest I think that this has done extremely well over that week. I do believe that she can tolerate the sharp debridement today which will be good. I think that cleaning off the wound will likely allow [place] to be able to use a different type of dressing I do not think the Iodoflex will even be necessary based on how clean the wound looks. Fortunately there is no sign of active infection at this time which is great news. No fevers, chills, nausea, vomiting, or diarrhea. RAELENA, BLANDO ([id number]) Objective Constitutional Well-nourished and well-hydrated in no acute distress. Vitals Time Taken: 1:44 PM, Height: 69 in, Weight: 244 lbs, BMI: 36, Temperature: 99.1 F, Pulse: 71 bpm, Respiratory Rate: 18 breaths/min, Blood Pressure: 152/80 mmHg. Respiratory normal breathing without difficulty. Psychiatric this patient is able to make decisions and demonstrates good insight into disease process. Alert and Oriented x 3. pleasant and cooperative. General Notes: Patient's wound bed required some sharp debridement to clear away necrotic debris from the surface of the wound she tolerated that today without complication postdebridement the wound bed appears to be doing excellent the surface is nice and clean I do not see any need for Iodoflex to be perfectly honest I think that she would probably do quite well with collagen based on what I am seeing. She is extremely swollen she does have a history of  congestive heart failure. I did encourage her to get in touch with her cardiologist. She also did get her lymphedema pumps. Integumentary (Hair, Skin) Wound #2 status is Open. Original cause of wound was Gradually Appeared. The date acquired was: 09/08/2020. The wound has been in treatment 1 weeks. The wound is located on the Left,Lateral Lower Leg. The wound measures 2cm length x 3.5cm width x 0.1cm depth; 5.498cm^2 area and 0.55cm^3 volume. There is Fat Layer (Subcutaneous Tissue) exposed. There is no tunneling or undermining noted. There is a medium amount of serosanguineous drainage noted. There is small (1-33%) pink granulation within the wound bed. There is a large (67-100%) amount of necrotic tissue within the wound bed including Adherent Slough. 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 There was a Excisional Skin/Subcutaneous Tissue Debridement with a total area of 7 sq cm performed by Tommie Sams., PA-C. With the following instrument(s): Curette to remove Viable and Non-Viable tissue/material. Material removed includes Subcutaneous Tissue, Slough, Skin: Dermis, and Skin: Epidermis after achieving pain control using Lidocaine 4% Topical Solution. No specimens were taken. A time out was conducted at 14:05, prior to the start of the procedure. A Minimum amount of bleeding was controlled with Pressure. The procedure was tolerated well with a pain level of 0 throughout and a pain level of 0 following the procedure. Post Debridement Measurements: 2cm length x 3.5cm width x 0.1cm depth; 0.55cm^3  volume. Character of Wound/Ulcer Post Debridement is improved. Severity of Tissue Post  Debridement is: Fat layer exposed. Post procedure Diagnosis Wound #2: Same as Pre-Procedure Plan REYNALDO, VOLTAIRE ([id number]) Follow-up Appointments: Wound #2 Left,Lateral Lower Leg: Return Appointment in 1 week. Bathing/ Shower/ Hygiene: Wound #2 Left,Lateral Lower Leg: May shower with wound dressing protected with water repellent cover or cast protector. Anesthetic (Use 'Patient Medications' Section for Anesthetic Order Entry): Wound #2 Left,Lateral Lower Leg: Lidocaine applied to wound bed Edema Control - Lymphedema / Segmental Compressive Device / Other: Optional: One layer of unna paste to top of compression wrap (to act as an anchor). 3 Layer Compression System for Lymphedema. - right Elevate, Exercise Daily and Avoid Standing for Long Periods of Time. Elevate legs to the level of the heart and pump ankles as often as possible Elevate leg(s) parallel to the floor when sitting. Compression Pump: Use compression pump on left lower extremity for 60 minutes, twice daily. - You may pump over wraps Compression Pump: Use compression pump on right lower extremity for 60 minutes, twice daily. - You may pump over wraps Additional Orders / Instructions: Wound #2 Left,Lateral Lower Leg: Follow Nutritious Diet and Increase Protein Intake - Increase protein intake. WOUND #2: - Lower Leg Wound Laterality: Left, Lateral Primary Dressing: Prisma 4.34 (in) Discharge Instructions: Moisten w/normal saline or sterile water; Cover wound as directed. Do not remove from wound bed. Secondary Dressing: Xtrasorb Medium 4x5 (in/in) Discharge Instructions: Apply to wound as directed. Do not cut. Compression Wrap: Profore Lite LF 3 Multilayer Compression Bandaging System Discharge Instructions: Apply 3 multi-layer wrap as prescribed. 1. I am going to recommend currently that we go ahead and switch over to the silver  collagen I think based on the cleanliness of the wound I do not believe the Iodoflex is necessary any longer. 2. Also can recommend that we have the patient go ahead and continue with a 3 layer compression wrap for the leg. I think think this is doing a great job. 3. I am going to suggest as well that we have the patient continue with lymphedema pumps. She did get those that is also on hopefully they will be helpful for her as well. We will see patient back for reevaluation in 1 week here in the clinic. If anything worsens or changes patient will contact our office for additional recommendations. Electronic Signature(s) Signed: 01/25/2021 2:14:06 PM By: Worthy Keeler PA-C Entered By: Worthy Keeler on 01/25/2021 14:14:06 [name] ([id number]) -------------------------------------------------------------------------------- SuperBill Details Patient Name: [name] Date of Service: 01/25/2021 Medical Record Number: [id number] Patient Account Number: [id number] Date of Birth/Sex: 1946/08/21 (75 y.o. 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 Facility Procedures CPT4 Code: [id number] Description: 11042 - DEB SUBQ TISSUE 20 SQ CM/< Modifier: Quantity: 1 CPT4 Code: Description: ICD-10 Diagnosis Description L97.828 Non-pressure chronic ulcer of other part of left lower leg with other spec Modifier: ified severity Quantity: Physician Procedures CPT4 Code: [id number] Description: 11042 - WC PHYS SUBQ TISS 20 SQ CM Modifier: Quantity: 1 CPT4  Code: Description: ICD-10 Diagnosis Description L97.828 Non-pressure chronic ulcer of other part of left lower leg with other spec Modifier: ified severity Quantity: Electronic Signature(s) Signed: 01/25/2021 2:14:19 PM By: Worthy Keeler PA-C Entered By:  Worthy Keeler on 01/25/2021 14:14:19

## 2021-01-29 ENCOUNTER — Ambulatory Visit (INDEPENDENT_AMBULATORY_CARE_PROVIDER_SITE_OTHER): Payer: Medicare HMO | Admitting: Nurse Practitioner

## 2021-02-01 ENCOUNTER — Ambulatory Visit: Payer: Medicare HMO | Admitting: Internal Medicine

## 2021-02-05 ENCOUNTER — Other Ambulatory Visit: Payer: Self-pay

## 2021-02-05 DIAGNOSIS — L97828 Non-pressure chronic ulcer of other part of left lower leg with other specified severity: Secondary | ICD-10-CM | POA: Diagnosis not present

## 2021-02-05 NOTE — Progress Notes (Signed)
[name], [name] ([id number]) Visit Report for 02/05/2021 Arrival Information Details Patient Name: [name] Date of Service: 02/05/2021 12:30 PM Medical Record Number: [id number] Patient Account Number: [id number] Date of Birth/Sex: Nov 19, 1945 (75 y.o. F) Treating RN: Dolan Amen Primary Care Emiyah Spraggins: [name] Other Clinician: Referring Chavie Kolinski: [name] Treating Milton Sagona/Extender: Skipper Cliche in Treatment: 2 Visit Information History Since Last Visit Pain Present Now: Yes Patient Arrived: Walker Arrival Time: 13:00 Accompanied By: self Transfer Assistance: None Patient Identification Verified: Yes Secondary Verification Process Completed: Yes Patient Has Alerts: Yes Patient Alerts: NOT DIABETIC Electronic Signature(s) Signed: 02/05/2021 4:43:03 PM By: Georges Mouse, Minus Breeding RN Entered By: Georges Mouse, Minus Breeding on 02/05/2021 13:01:11 [name] ([id number]) -------------------------------------------------------------------------------- Clinic Level of Care Assessment Details Patient Name: [name] Date of Service: 02/05/2021 12:30 PM Medical Record Number: [id number] Patient Account Number: [id number] Date of Birth/Sex: Apr 23, 1946 (75 y.o. F) Treating RN: Dolan Amen Primary Care Caisen Mangas: [name] Other Clinician: Referring Kaion Tisdale: [name] Treating Heaven Wandell/Extender: Skipper Cliche in Treatment: 2 Clinic Level of Care Assessment Items TOOL 1 Quantity Score []  - Use when EandM and Procedure is performed on INITIAL visit 0 ASSESSMENTS - Nursing Assessment / Reassessment []  - General Physical Exam (combine w/ comprehensive assessment (listed just below) when performed on new 0 pt. evals) []  - 0 Comprehensive Assessment (HX, ROS, Risk Assessments, Wounds Hx, etc.) ASSESSMENTS - Wound and Skin Assessment / Reassessment []  - Dermatologic / Skin Assessment (not related to wound area) 0 ASSESSMENTS - Ostomy and/or  Continence Assessment and Care []  - Incontinence Assessment and Management 0 []  - 0 Ostomy Care Assessment and Management (repouching, etc.) PROCESS - Coordination of Care []  - Simple Patient / Family Education for ongoing care 0 []  - 0 Complex (extensive) Patient / Family Education for ongoing care []  - 0 Staff obtains Programmer, systems, Records, Test Results / Process Orders []  - 0 Staff telephones HHA, Nursing Homes / Clarify orders / etc []  - 0 Routine Transfer to another Facility (non-emergent condition) []  - 0 Routine Hospital Admission (non-emergent condition) []  - 0 New Admissions / Biomedical engineer / Ordering NPWT, Apligraf, etc. []  - 0 Emergency Hospital Admission (emergent condition) PROCESS - Special Needs []  - Pediatric / Minor Patient Management 0 []  - 0 Isolation Patient Management []  - 0 Hearing / Language / Visual special needs []  - 0 Assessment of Community assistance (transportation, D/C planning, etc.) []  - 0 Additional assistance / Altered mentation []  - 0 Support Surface(s) Assessment (bed, cushion, seat, etc.) INTERVENTIONS - Miscellaneous []  - External ear exam 0 []  - 0 Patient Transfer (multiple staff / Civil Service fast streamer / Similar devices) []  - 0 Simple Staple / Suture removal (25 or less) []  - 0 Complex Staple / Suture removal (26 or more) []  - 0 Hypo/Hyperglycemic Management (do not check if billed separately) []  - 0 Ankle / Brachial Index (ABI) - do not check if billed separately Has the patient been seen at the hospital within the last three years: Yes Total Score: 0 Level Of Care: ____ [name] ([id number]) Electronic Signature(s) Signed: 02/05/2021 4:43:03 PM By: Georges Mouse, Minus Breeding RN Entered By: Georges Mouse, Minus Breeding on 02/05/2021 13:16:03 [name] ([id number]) -------------------------------------------------------------------------------- Compression Therapy Details Patient Name: [name] Date of Service:  02/05/2021 12:30 PM Medical Record Number: [id number] Patient Account Number: [id number] Date of Birth/Sex: Apr 10, 1946 (75 y.o. F) Treating RN: Dolan Amen Primary Care Austyn Seier: [name] Other Clinician: Referring Freida Nebel: [name] Treating Jaydan Chretien/Extender: Skipper Cliche in Treatment: 2 Compression Therapy  Performed for Wound Assessment: Wound #2 Left,Lateral Lower Leg Performed By: Clinician Dolan Amen, RN Compression Type: Three Layer Electronic Signature(s) Signed: 02/05/2021 4:43:03 PM By: Georges Mouse, Minus Breeding RN Entered By: Georges Mouse, Minus Breeding on 02/05/2021 13:15:30 [name] ([id number]) -------------------------------------------------------------------------------- Encounter Discharge Information Details Patient Name: [name] Date of Service: 02/05/2021 12:30 PM Medical Record Number: [id number] Patient Account Number: [id number] Date of Birth/Sex: Mar 30, 1946 (75 y.o. F) Treating RN: Dolan Amen Primary Care Aerianna Losey: [name] Other Clinician: Referring Yailen Zemaitis: [name] Treating Oluwasemilore Bahl/Extender: Skipper Cliche in Treatment: 2 Encounter Discharge Information Items Discharge Condition: Stable Ambulatory Status: Walker Discharge Destination: Home Transportation: Private Auto Accompanied By: self Schedule Follow-up Appointment: Yes Clinical Summary of Care: Electronic Signature(s) Signed: 02/05/2021 4:43:03 PM By: Georges Mouse, Minus Breeding RN Entered By: Georges Mouse, Minus Breeding on 02/05/2021 13:15:57 [name] ([id number]) -------------------------------------------------------------------------------- Wound Assessment Details Patient Name: [name] Date of Service: 02/05/2021 12:30 PM Medical Record Number: [id number] Patient Account Number: [id number] Date of Birth/Sex: 22-Oct-1945 (75 y.o. F) Treating RN: Dolan Amen Primary Care Kimberlyann Hollar: [name] Other Clinician: Referring Kristopher Attwood: [name] Treating Theoden Mauch/Extender: Skipper Cliche in Treatment: 2 Wound Status Wound Number: 2 Primary Etiology: Venous Leg Ulcer Wound Location: Left, Lateral Lower Leg Wound Status: Open Wounding Event: Gradually Appeared Date Acquired: 09/08/2020 Weeks Of Treatment: 2 Clustered Wound: No Wound Measurements Length: (cm) 2 Width: (cm) 3.5 Depth: (cm) 0.1 Area: (cm) 5.498 Volume: (cm) 0.55 % Reduction in Area: -16.7% % Reduction in Volume: -16.8% Wound Description Classification: Full Thickness Without Exposed Support Structure s Treatment Notes Wound #2 (Lower Leg) Wound Laterality: Left, Lateral Cleanser Peri-Wound Care Topical Primary Dressing Silvercel Small 2x2 (in/in) Discharge Instruction: Apply Silvercel Small 2x2 (in/in) as instructed Secondary Dressing Xtrasorb Medium 4x5 (in/in) Discharge Instruction: Apply to wound as directed. Do not cut. Secured With Compression Wrap Profore Lite LF 3 Multilayer Compression Bandaging System Discharge Instruction: Apply 3 multi-layer wrap as prescribed. Compression Stockings Add-Ons Electronic Signature(s) Signed: 02/05/2021 4:43:03 PM By: Georges Mouse, Minus Breeding RN Entered By: Georges Mouse, Minus Breeding on 02/05/2021 13:01:28

## 2021-02-07 ENCOUNTER — Ambulatory Visit: Payer: Medicare HMO | Admitting: Oncology

## 2021-02-07 ENCOUNTER — Other Ambulatory Visit: Payer: Medicare HMO

## 2021-02-12 ENCOUNTER — Other Ambulatory Visit: Payer: Self-pay

## 2021-02-12 ENCOUNTER — Encounter: Payer: Medicare HMO | Attending: Internal Medicine | Admitting: Physician Assistant

## 2021-02-12 DIAGNOSIS — L97828 Non-pressure chronic ulcer of other part of left lower leg with other specified severity: Secondary | ICD-10-CM | POA: Diagnosis present

## 2021-02-12 DIAGNOSIS — Z853 Personal history of malignant neoplasm of breast: Secondary | ICD-10-CM | POA: Diagnosis not present

## 2021-02-12 DIAGNOSIS — I87332 Chronic venous hypertension (idiopathic) with ulcer and inflammation of left lower extremity: Secondary | ICD-10-CM | POA: Insufficient documentation

## 2021-02-12 DIAGNOSIS — I509 Heart failure, unspecified: Secondary | ICD-10-CM | POA: Diagnosis not present

## 2021-02-12 DIAGNOSIS — G629 Polyneuropathy, unspecified: Secondary | ICD-10-CM | POA: Diagnosis not present

## 2021-02-12 DIAGNOSIS — I890 Lymphedema, not elsewhere classified: Secondary | ICD-10-CM | POA: Diagnosis not present

## 2021-02-12 DIAGNOSIS — I110 Hypertensive heart disease with heart failure: Secondary | ICD-10-CM | POA: Diagnosis not present

## 2021-02-12 NOTE — Progress Notes (Signed)
[name], WITTING ([id number]) Visit Report for 02/12/2021 Arrival Information Details Patient Name: [name], [name] Date of Service: 02/12/2021 2:00 PM Medical Record Number: [id number] Patient Account Number: [id number] Date of Birth/Sex: 1945-12-28 (75 y.o. F) Treating RN: Carlene Coria Primary Care Carney Saxton: [name] Other Clinician: Referring Treyden Hakim: [name] Treating Gervis Gaba/Extender: Skipper Cliche in Treatment: 3 Visit Information History Since Last Visit All ordered tests and consults were completed: No Patient Arrived: Gilford Rile Added or deleted any medications: No Arrival Time: 14:01 Any new allergies or adverse reactions: No Accompanied By: self Had a fall or experienced change in No Transfer Assistance: None activities of daily living that may affect Patient Identification Verified: Yes risk of falls: Secondary Verification Process Completed: Yes Signs or symptoms of abuse/neglect since last visito No Patient Requires Transmission-Based Precautions: No Hospitalized since last visit: No Patient Has Alerts: Yes Implantable device outside of the clinic excluding No Patient Alerts: NOT DIABETIC cellular tissue based products placed in the center since last visit: Has Dressing in Place as Prescribed: Yes Has Compression in Place as Prescribed: Yes Pain Present Now: No Electronic Signature(s) Signed: 02/12/2021 4:33:14 PM By: Carlene Coria RN Entered By: Carlene Coria on 02/12/2021 14:06:29 [name] ([id number]) -------------------------------------------------------------------------------- Clinic Level of Care Assessment Details Patient Name: [name] Date of Service: 02/12/2021 2:00 PM Medical Record Number: [id number] Patient Account Number: [id number] Date of Birth/Sex: [birth year] (75 y.o. F) Treating RN: Dolan Amen Primary Care Abigial Newville: [name] Other Clinician: Referring Delpha Perko: [name] Treating Emmalou Hunger/Extender: Skipper Cliche in Treatment: 3 Clinic Level of Care Assessment Items TOOL 1 Quantity Score _0  - Use when EandM and Procedure is performed on INITIAL visit 0 ASSESSMENTS - Nursing Assessment / Reassessment _1  - General Physical Exam (combine w/ comprehensive assessment (listed just below) when performed on new 0 pt. evals) _2  - 0 Comprehensive Assessment (HX, ROS, Risk Assessments, Wounds Hx, etc.) ASSESSMENTS - Wound and Skin Assessment / Reassessment _3  - Dermatologic / Skin Assessment (not related to wound area) 0 ASSESSMENTS - Ostomy and/or Continence Assessment and Care _4  - Incontinence Assessment and Management 0 _5  - 0 Ostomy Care Assessment and Management (repouching, etc.) PROCESS - Coordination of Care _6  - Simple Patient / Family Education for ongoing care 0 _7  - 0 Complex (extensive) Patient / Family Education for ongoing care _8  - 0 Staff obtains Programmer, systems, Records, Test Results / Process Orders _9  - 0 Staff telephones HHA, Nursing Homes / Clarify orders / etc _10  - 0 Routine Transfer to another Facility (non-emergent condition) _11  - 0 Routine Hospital Admission (non-emergent condition) _12  - 0 New Admissions / Biomedical engineer / Ordering NPWT, Apligraf, etc. _13  - 0 Emergency Hospital Admission (emergent condition) PROCESS - Special Needs _14  - Pediatric / Minor Patient Management 0 _15  - 0 Isolation Patient Management _16  - 0 Hearing / Language / Visual special needs _17  - 0 Assessment of Community assistance (transportation, D/C planning, etc.) _18  - 0 Additional assistance / Altered mentation _19  - 0 Support Surface(s) Assessment (bed, cushion, seat, etc.) INTERVENTIONS - Miscellaneous _20  - External ear exam 0 _21  - 0 Patient Transfer (multiple staff / Civil Service fast streamer / Similar devices) _22  - 0 Simple Staple / Suture removal (25 or less) _23  - 0 Complex Staple / Suture removal (26 or more) _24  - 0 Hypo/Hyperglycemic Management (do not check if billed  separately) _25  - 0 Ankle / Brachial Index (ABI) - do not check if billed separately Has the patient been seen at the hospital within the last  three years: Yes Total Score: 0 Level Of Care: ____ [name] ([id number]) Electronic Signature(s) Signed: 02/12/2021 4:19:01 PM By: Georges Mouse, Minus Breeding RN Entered By: Georges Mouse, Minus Breeding on 02/12/2021 14:41:45 [name] ([id number]) -------------------------------------------------------------------------------- Compression Therapy Details Patient Name: [name] Date of Service: 02/12/2021 2:00 PM Medical Record Number: [id number] Patient Account Number: [id number] Date of Birth/Sex: 20-Jul-1946 (75 y.o. F) Treating RN: Dolan Amen Primary Care Annisha Baar: [name] Other Clinician: Referring Nikie [name]: [name] Treating Laylaa Guevarra/Extender: Skipper Cliche in Treatment: 3 Compression Therapy Performed for Wound Assessment: Wound #2 Left,Lateral Lower Leg Performed By: Clinician Dolan Amen, RN Compression Type: Three Layer Post Procedure Diagnosis Same as Pre-procedure Notes pt tolerating wrap well Electronic Signature(s) Signed: 02/12/2021 4:19:01 PM By: Georges Mouse, Minus Breeding RN Entered By: Georges Mouse, Minus Breeding on 02/12/2021 14:39:44 [name] ([id number]) -------------------------------------------------------------------------------- Encounter Discharge Information Details Patient Name: [name] Date of Service: 02/12/2021 2:00 PM Medical Record Number: [id number] Patient Account Number: [id number] Date of Birth/Sex: 11-14-1945 (75 y.o. F) Treating RN: Donnamarie Poag Primary Care Memory Heinrichs: [name] Other Clinician: Referring Teandra Harlan: [name] Treating Erika Hussar/Extender: Skipper Cliche in Treatment: 3 Encounter Discharge Information Items Discharge Condition: Stable Ambulatory Status: Walker Discharge Destination: Home Transportation: Private Auto Accompanied By:  self Schedule Follow-up Appointment: Yes Clinical Summary of Care: Electronic Signature(s) Signed: 02/12/2021 4:32:41 PM By: Donnamarie Poag Entered By: Donnamarie Poag on 02/12/2021 14:53:33 [name] ([id number]) -------------------------------------------------------------------------------- Lower Extremity Assessment Details Patient Name: [name] Date of Service: 02/12/2021 2:00 PM Medical Record Number: [id number] Patient Account Number: [id number] Date of Birth/Sex: 1946-07-24 (75 y.o. F) Treating RN: Carlene Coria Primary Care Murl Zogg: [name] Other Clinician: Referring Kelli Egolf: [name] Treating Jany Buckwalter/Extender: Skipper Cliche in Treatment: 3 Edema Assessment Assessed: [Left: No] [Right: No] Edema: [Left: Ye] [Right: s] Calf Left: Right: Point of Measurement: 45 cm From Medial Instep 47 cm Ankle Left: Right: Point of Measurement: 10 cm From Medial Instep 29 cm Vascular Assessment Pulses: Dorsalis Pedis Palpable: [Left:Yes] Electronic Signature(s) Signed: 02/12/2021 4:33:14 PM By: Carlene Coria RN Entered By: Carlene Coria on 02/12/2021 14:16:28 [name] ([id number]) -------------------------------------------------------------------------------- Multi Wound Chart Details Patient Name: [name] Date of Service: 02/12/2021 2:00 PM Medical Record Number: [id number] Patient Account Number: [id number] Date of Birth/Sex: 05/27/1946 (75 y.o. F) Treating RN: Dolan Amen Primary Care Ranyia Witting: [name] Other Clinician: Referring Ritaj Dullea: [name] Treating Zenovia Justman/Extender: Skipper Cliche in Treatment: 3 Vital Signs Height(in): 69 Pulse(bpm): 64 Weight(lbs): 244 Blood Pressure(mmHg): 144/69 Body Mass Index(BMI): 36 Temperature(F): 98.6 Respiratory Rate(breaths/min): 18 Photos: [N/A:N/A] Wound Location: Left, Lateral Lower Leg N/A N/A Wounding Event: Gradually Appeared N/A N/A Primary Etiology: Venous Leg Ulcer N/A  N/A Comorbid History: Cataracts, Lymphedema, Sleep N/A N/A Apnea, Congestive Heart Failure, Hypertension, Osteoarthritis, Neuropathy, Received Chemotherapy, Received Radiation Date Acquired: 09/08/2020 N/A N/A Weeks of Treatment: 3 N/A N/A Wound Status: Open N/A N/A Measurements L x W x D (cm) 3x4.2x1 N/A N/A Area (cm) : 9.896 N/A N/A Volume (cm) : 9.896 N/A N/A % Reduction in Area: -110.00% N/A N/A % Reduction in Volume: -2001.10% N/A N/A Classification: Full Thickness Without Exposed N/A N/A Support Structures Exudate Amount: Medium N/A N/A Exudate Type: Serosanguineous N/A N/A Exudate Color: red, brown N/A N/A Granulation Amount: Small (1-33%) N/A N/A Granulation Quality: Red N/A N/A Necrotic Amount: Large (67-100%) N/A N/A Exposed Structures: Fat Layer (Subcutaneous Tissue): N/A N/A Yes Fascia: No Tendon: No Muscle: No Joint: No Bone: No Epithelialization: Medium (34-66%) N/A N/A Treatment Notes Electronic Signature(s) Signed: 02/12/2021 4:19:01 PM  By: Georges Mouse, Minus Breeding RN Entered By: Georges Mouse, Minus Breeding on 02/12/2021 14:38:44 [name] ([id number]) -------------------------------------------------------------------------------- Multi-Disciplinary Care Plan Details Patient Name: [name] Date of Service: 02/12/2021 2:00 PM Medical Record Number: [id number] Patient Account Number: [id number] Date of Birth/Sex: 1946/08/31 (75 y.o. F) Treating RN: Dolan Amen Primary Care Hason Ofarrell: [name] Other Clinician: Referring Chlora Mcbain: [name] Treating Mykala Mccready/Extender: Skipper Cliche in Treatment: 3 Active Inactive Medication Nursing Diagnoses: Knowledge deficit related to medication safety: actual or potential Goals: Patient/caregiver will demonstrate understanding of all current medications Date Initiated: 01/16/2021 Target Resolution Date: 01/16/2021 Goal Status: Active Patient/caregiver will demonstrate understanding of new oral/IV  medications prescribed at the [facility] (topical prescriptions are covered under the skin breakdown problem) Date Initiated: 01/16/2021 Target Resolution Date: 01/16/2021 Goal Status: Active Interventions: Assess for medication contraindications each visit where new medications are prescribed Notes: Venous Leg Ulcer Nursing Diagnoses: Actual venous Insuffiency (use after diagnosis is confirmed) Goals: Patient will maintain optimal edema control Date Initiated: 01/16/2021 Target Resolution Date: 01/30/2021 Goal Status: Active Patient/caregiver will verbalize understanding of disease process and disease management Date Initiated: 01/16/2021 Target Resolution Date: 01/30/2021 Goal Status: Active Verify adequate tissue perfusion prior to therapeutic compression application Date Initiated: 01/16/2021 Date Inactivated: 01/25/2021 Target Resolution Date: 01/16/2021 Goal Status: Met Interventions: Assess peripheral edema status every visit. Treatment Activities: Therapeutic compression applied : 01/16/2021 Notes: Wound/Skin Impairment Nursing Diagnoses: Impaired tissue integrity Goals: Patient/caregiver will verbalize understanding of skin care regimen Date Initiated: 01/16/2021 Target Resolution Date: 02/16/2021 Goal Status: Active Ulcer/skin breakdown will have a volume reduction of 30% by week 4 Date Initiated: 01/16/2021 Target Resolution Date: 02/16/2021 [name], [name] ([id number]) Goal Status: Active Ulcer/skin breakdown will have a volume reduction of 50% by week 8 Date Initiated: 01/16/2021 Target Resolution Date: 03/18/2021 Goal Status: Active Interventions: Assess ulceration(s) every visit Treatment Activities: Topical wound management initiated : 01/16/2021 Notes: Electronic Signature(s) Signed: 02/12/2021 4:19:01 PM By: Georges Mouse, Minus Breeding RN Entered By: Georges Mouse, Minus Breeding on 02/12/2021 14:38:07 [name]  ([id number]) -------------------------------------------------------------------------------- Pain Assessment Details Patient Name: [name] Date of Service: 02/12/2021 2:00 PM Medical Record Number: [id number] Patient Account Number: [id number] Date of Birth/Sex: 1945-10-10 (75 y.o. F) Treating RN: Carlene Coria Primary Care Shylyn Younce: [name] Other Clinician: Referring Chike Farrington: [name] Treating Lyrique Hakim/Extender: Skipper Cliche in Treatment: 3 Active Problems Location of Pain Severity and Description of Pain Patient Has Paino No Site Locations Pain Management and Medication Current Pain Management: Electronic Signature(s) Signed: 02/12/2021 4:33:14 PM By: Carlene Coria RN Entered By: Carlene Coria on 02/12/2021 14:07:04 [name] ([id number]) -------------------------------------------------------------------------------- Patient/Caregiver Education Details Patient Name: [name] Date of Service: 02/12/2021 2:00 PM Medical Record Number: [id number] Patient Account Number: [id number] Date of Birth/Gender: 03/22/1946 (75 y.o. F) Treating RN: Dolan Amen Primary Care Physician: [name] Other Clinician: Referring Physician: [name] Treating Physician/Extender: Skipper Cliche in Treatment: 3 Education Assessment Education Provided To: Patient Education Topics Provided Wound/Skin Impairment: Methods: Explain/Verbal Responses: State content correctly Electronic Signature(s) Signed: 02/12/2021 4:19:01 PM By: Georges Mouse, Minus Breeding RN Entered By: Georges Mouse, Minus Breeding on 02/12/2021 14:42:03 [name] ([id number]) -------------------------------------------------------------------------------- Wound Assessment Details Patient Name: [name] Date of Service: 02/12/2021 2:00 PM Medical Record Number: [id number] Patient Account Number: [id number] Date of Birth/Sex: [birth year] (75 y.o. F) Treating RN: Carlene Coria Primary Care  Spike Desilets: [name] Other Clinician: Referring Quantavius Humm: [name] Treating Laurina Fischl/Extender: Skipper Cliche in Treatment: 3 Wound Status Wound Number: 2 Primary Venous Leg Ulcer Etiology: Wound Location: Left, Lateral Lower Leg Wound  Open Wounding Event: Gradually Appeared Status: Date Acquired: 09/08/2020 Comorbid Cataracts, Lymphedema, Sleep Apnea, Congestive Heart Weeks Of Treatment: 3 History: Failure, Hypertension, Osteoarthritis, Neuropathy, Clustered Wound: No Received Chemotherapy, Received Radiation Photos Wound Measurements Length: (cm) 3 Width: (cm) 4.2 Depth: (cm) 1 Area: (cm) 9.896 Volume: (cm) 9.896 % Reduction in Area: -110% % Reduction in Volume: -2001.1% Epithelialization: Medium (34-66%) Tunneling: No Undermining: No Wound Description Classification: Full Thickness Without Exposed Support Structu Exudate Amount: Medium Exudate Type: Serosanguineous Exudate Color: red, brown res Foul Odor After Cleansing: No Slough/Fibrino Yes Wound Bed Granulation Amount: Small (1-33%) Exposed Structure Granulation Quality: Red Fascia Exposed: No Necrotic Amount: Large (67-100%) Fat Layer (Subcutaneous Tissue) Exposed: Yes Necrotic Quality: Adherent Slough Tendon Exposed: No Muscle Exposed: No Joint Exposed: No Bone Exposed: No Treatment Notes Wound #2 (Lower Leg) Wound Laterality: Left, Lateral Cleanser Soap and Water Discharge Instruction: Gently cleanse wound with antibacterial soap, rinse and pat dry prior to dressing wounds Peri-Wound Care [place], Jaiona ([id number]) Topical Triamcinolone Acetonide Cream, 0.1%, 15 (g) tube Discharge Instruction: Apply as directed by Jedi Catalfamo. Primary Dressing Silvercel Small 2x2 (in/in) Discharge Instruction: Apply Silvercel Small 2x2 (in/in) as instructed Secondary Dressing Xtrasorb Medium 4x5 (in/in) Discharge Instruction: Apply to wound as directed. Do not cut. Secured With Compression  Wrap Profore Lite LF 3 Multilayer Compression Bandaging System Discharge Instruction: Apply 3 multi-layer wrap as prescribed. Compression Stockings Add-Ons Electronic Signature(s) Signed: 02/12/2021 4:33:14 PM By: Carlene Coria RN Entered By: Carlene Coria on 02/12/2021 14:15:12 [name] ([id number]) -------------------------------------------------------------------------------- Vitals Details Patient Name: [name] Date of Service: 02/12/2021 2:00 PM Medical Record Number: [id number] Patient Account Number: [id number] Date of Birth/Sex: 09/22/1945 (75 y.o. F) Treating RN: Carlene Coria Primary Care Jalayla Chrismer: [name] Other Clinician: Referring Novak Stgermaine: [name] Treating Azai Gaffin/Extender: Skipper Cliche in Treatment: 3 Vital Signs Time Taken: 14:06 Temperature (F): 98.6 Height (in): 69 Pulse (bpm): 59 Weight (lbs): 244 Respiratory Rate (breaths/min): 18 Body Mass Index (BMI): 36 Blood Pressure (mmHg): 144/69 Reference Range: 80 - 120 mg / dl Electronic Signature(s) Signed: 02/12/2021 4:33:14 PM By: Carlene Coria RN Entered By: Carlene Coria on 02/12/2021 14:06:53

## 2021-02-12 NOTE — Progress Notes (Addendum)
[name] ([id number]) Visit Report for 02/12/2021 Chief Complaint Document Details Patient Name: [name], [name] Date of Service: 02/12/2021 2:00 PM Medical Record Number: [id number] Patient Account Number: [id number] Date of Birth/Sex: [birth year] (75 y.o. F) Treating RN: Dolan Amen Primary Care Provider: [name] Other Clinician: Referring Provider: [name] Treating Provider/Extender: Skipper Cliche in Treatment: 3 Information Obtained from: Patient Chief Complaint Left LE Ulcer Electronic Signature(s) Signed: 02/12/2021 2:22:21 PM By: Worthy Keeler PA-C Entered By: Worthy Keeler on 02/12/2021 14:22:21 [name] ([id number]) -------------------------------------------------------------------------------- HPI Details Patient Name: [name] Date of Service: 02/12/2021 2:00 PM Medical Record Number: [id number] Patient Account Number: [id number] Date of Birth/Sex: 05-16-1946 (75 y.o. F) Treating RN: Dolan Amen Primary Care Provider: [name] Other Clinician: Referring Provider: [name] Treating Provider/Extender: Skipper Cliche in Treatment: 3 History of Present Illness HPI Description: 03/15/2020 upon evaluation today patient presents today for initial evaluation here in our clinic concerning a wound that is on the left posterior lower extremity. Unfortunately this has been giving the patient some discomfort at this point which she notes has been affected her pretty much daily. The patient does have a history of venous insufficiency, hypertension, congestive heart failure, and a history of breast cancer. Fortunately there does not appear to be evidence of active infection at this time which is great news. No fevers, chills, nausea, vomiting, or diarrhea. Wound is not extremely large but does have some slough covering the surface of the wound. This is going require sharp debridement today. 03/15/2020 upon evaluation today patient appears  to be doing fairly well in regard to her wound. She does have some slough noted on the surface of the wound currently but I do believe the Iodoflex has been beneficial over the past week. There is no signs of active infection at this time which is great news and overall very pleased with the progress. No fevers, chills, nausea, vomiting, or diarrhea. 04/02/2020 upon evaluation today patient appears to be doing a little better in regard to her wound size wise this is not tremendously smaller she does have some slough buildup on the surface of the wound. With that being said this is going require some sharp debridement today to clear away some of the necrotic debris. Fortunately there is no evidence of active infection at this time. No fevers, chills, nausea, vomiting, or diarrhea. 04/10/2020 on evaluation today patient appears to be doing well with regard to her ulcer which is measuring somewhat smaller today. She actually has more granulation tissue noted which is also good news is no need for sharp debridement today. Fortunately there is no evidence of infection either which is also excellent. No fevers, chills, nausea, vomiting, or diarrhea. 04/26/2020 on evaluation today patient's wound actually is showing signs of good improvement which is great news. There does not appear to be any evidence of active infection. I do believe she is tolerating the collagen at this point. 05/03/2020 upon evaluation today patient's wound actually showed signs of good granulation at this time there does not appear to be any evidence of active infection which is great news and overall very pleased with where things stand. With that being said I do believe that the patient is can require some sharp debridement today but fortunately nothing too significant. 05/11/20 on evaluation today patient appears to be doing well in regard to her leg ulcer. She's been tolerating the dressing changes without complication. Fortunately there  is no signs of active infection at this time.  Overall I'm very pleased with where things stand. 05/18/2020 upon evaluation today patient's wound is showing signs of improvement is very dry and I think the collagen for the most part just dried to the wound bed. I am going to clean this away with sharp debridement in order to get this under control in my opinion. 05/25/2020 upon evaluation today patient actually appears to be doing well in regard to her leg ulcer. In fact upon inspection it appears she could potentially be completely healed but that is not guaranteed based on what we are seeing. There does not appear to be any signs of active infection which is great news. 05/31/2020 on evaluation today patient appears to be doing well with regard to her wounds. She in fact appears to be almost completely healed on the left leg there is just a very small opening remaining point 1 06/08/2020 upon evaluation today patient appears to be doing excellent in regard to her leg ulcer on the left in fact it appears to be that she is completely healed as of today. I see no signs of active infection at this time which is great news. No fevers, chills, nausea, vomiting, or diarrhea. READMISSION 01/16/2021 Patient that we have had in this clinic with a wound on the left posterior calf in the summer 2021 extending into October. She was felt to have chronic venous insufficiency. Per the patient's description she was discharged with what sounds like external compression stockings although she could not afford the "$75 per leg". She therefore did not get anything. Apparently her wound that she has currently is been in existence since January. She has been followed at vein and vascular with Unna boots and I think calcium alginate. She had ABIs done on 06/25/2020 that showed an noncompressible ABI on the right leg and the left leg but triphasic waveforms with good great toe pressures and a TBI of 0.90 on the right and 0.91  on the left Surprisingly the patient also had venous reflux studies that were really unremarkable. This included no evidence of DVTs or SVTs but there was no evidence of deep venous insufficiency or superficial venous insufficiency in the greater saphenous or short saphenous veins bilaterally. I would wonder about more central venous issues or perhaps this is all lymphedema 01/25/2021 upon evaluation today patient appears to be doing decently well and guard to her wound. The good news is the Iodoflex that is job she saw Dr. Dellia Nims last week for readmission and to be honest I think that this has done extremely well over that week. I do believe that she can tolerate the sharp debridement today which will be good. I think that cleaning off the wound will likely allow [place] to be able to use a different type of dressing I do not think the Iodoflex will even be necessary based on how clean the wound looks. Fortunately there is no sign of active infection at this time which is great news. No fevers, chills, nausea, vomiting, or diarrhea. 02/12/2021 upon evaluation today patient appears to be doing well with regard to her leg ulcer. We did switch to alginate when she came for nurse visit I think is doing much better for her. Fortunately there does not appear to be any signs of active infection at this time which is great news. No fevers, chills, nausea, vomiting, or diarrhea. ADELFA, LOZITO ([id number]) Electronic Signature(s) Signed: 02/12/2021 2:49:58 PM By: Worthy Keeler PA-C Entered By: Worthy Keeler on 02/12/2021 14:49:58 Wynetta Emery, Cheryel (  [id number]) -------------------------------------------------------------------------------- Physical Exam Details Patient Name: ARISSA, FAGIN Date of Service: 02/12/2021 2:00 PM Medical Record Number: [id number] Patient Account Number: [id number] Date of Birth/Sex: 10/28/1945 (75 y.o. F) Treating RN: Dolan Amen Primary Care Provider: [name] Other  Clinician: Referring Provider: [name] Treating Provider/Extender: Skipper Cliche in Treatment: 3 Constitutional Obese and well-hydrated in no acute distress. Respiratory normal breathing without difficulty. Psychiatric this patient is able to make decisions and demonstrates good insight into disease process. Alert and Oriented x 3. pleasant and cooperative. Notes Upon inspection patient's wound bed actually showed signs of good granulation epithelization at this point there was some slough noted mechanically abraded with saline and gauze no sharp debridement necessary at this point. Electronic Signature(s) Signed: 02/12/2021 2:50:35 PM By: Worthy Keeler PA-C Entered By: Worthy Keeler on 02/12/2021 14:50:35 [name] ([id number]) -------------------------------------------------------------------------------- Physician Orders Details Patient Name: [name] Date of Service: 02/12/2021 2:00 PM Medical Record Number: [id number] Patient Account Number: [id number] Date of Birth/Sex: June 27, 1946 (75 y.o. F) Treating RN: Dolan Amen Primary Care Provider: [name] Other Clinician: Referring Provider: [name] Treating Provider/Extender: Skipper Cliche in Treatment: 3 Verbal / Phone Orders: No Diagnosis Coding ICD-10 Coding Code Description I89.0 Lymphedema, not elsewhere classified I87.332 Chronic venous hypertension (idiopathic) with ulcer and inflammation of left lower extremity L97.828 Non-pressure chronic ulcer of other part of left lower leg with other specified severity Follow-up Appointments o Return Appointment in 1 week. Bathing/ Shower/ Hygiene o May shower with wound dressing protected with water repellent cover or cast protector. Anesthetic (Use 'Patient Medications' Section for Anesthetic Order Entry) o Lidocaine applied to wound bed Edema Control - Lymphedema / Segmental Compressive Device / Other Left Lower Extremity o  Optional: One layer of unna paste to top of compression wrap (to act as an anchor). o Elevate, Exercise Daily and Avoid Standing for Long Periods of Time. o Elevate legs to the level of the heart and pump ankles as often as possible o Elevate leg(s) parallel to the floor when sitting. o Compression Pump: Use compression pump on left lower extremity for 60 minutes, twice daily. - You may pump over wraps o Compression Pump: Use compression pump on right lower extremity for 60 minutes, twice daily. - You may pump over wraps Additional Orders / Instructions Wound #2 Left,Lateral Lower Leg o Follow Nutritious Diet and Increase Protein Intake - Increase protein intake. Wound Treatment Wound #2 - Lower Leg Wound Laterality: Left, Lateral Cleanser: Soap and Water 1 x Per Week/30 Days Discharge Instructions: Gently cleanse wound with antibacterial soap, rinse and pat dry prior to dressing wounds Topical: Triamcinolone Acetonide Cream, 0.1%, 15 (g) tube 1 x Per Week/30 Days Discharge Instructions: Apply as directed by provider. Primary Dressing: Silvercel Small 2x2 (in/in) 1 x Per Week/30 Days Discharge Instructions: Apply Silvercel Small 2x2 (in/in) as instructed Secondary Dressing: Xtrasorb Medium 4x5 (in/in) 1 x Per Week/30 Days Discharge Instructions: Apply to wound as directed. Do not cut. Compression Wrap: Profore Lite LF 3 Multilayer Compression Bandaging System 1 x Per Week/30 Days Discharge Instructions: Apply 3 multi-layer wrap as prescribed. Electronic Signature(s) Signed: 02/12/2021 4:19:01 PM By: Georges Mouse, Minus Breeding RN Signed: 02/12/2021 6:18:59 PM By: Worthy Keeler PA-C Entered By: Georges Mouse, Minus Breeding on 02/12/2021 14:41:40 [name] ([id number]) -------------------------------------------------------------------------------- Problem List Details Patient Name: [name] Date of Service: 02/12/2021 2:00 PM Medical Record Number: [id number] Patient Account  Number: [id number] Date of Birth/Sex: [birth year] (75 y.o. F) Treating RN: Dolan Amen Primary Care  Provider: [name] Other Clinician: Referring Provider: [name] Treating Provider/Extender: Skipper Cliche in Treatment: 3 Active Problems ICD-10 Encounter Code Description Active Date MDM Diagnosis I89.0 Lymphedema, not elsewhere classified 01/16/2021 No Yes I87.332 Chronic venous hypertension (idiopathic) with ulcer and inflammation of 01/16/2021 No Yes left lower extremity L97.828 Non-pressure chronic ulcer of other part of left lower leg with other 01/16/2021 No Yes specified severity Inactive Problems Resolved Problems Electronic Signature(s) Signed: 02/12/2021 2:22:04 PM By: Worthy Keeler PA-C Entered By: Worthy Keeler on 02/12/2021 14:22:03 [name] ([id number]) -------------------------------------------------------------------------------- Progress Note Details Patient Name: [name] Date of Service: 02/12/2021 2:00 PM Medical Record Number: [id number] Patient Account Number: [id number] Date of Birth/Sex: 1946/07/28 (75 y.o. F) Treating RN: Dolan Amen Primary Care Provider: [name] Other Clinician: Referring Provider: [name] Treating Provider/Extender: Skipper Cliche in Treatment: 3 Subjective Chief Complaint Information obtained from Patient Left LE Ulcer History of Present Illness (HPI) 03/15/2020 upon evaluation today patient presents today for initial evaluation here in our clinic concerning a wound that is on the left posterior lower extremity. Unfortunately this has been giving the patient some discomfort at this point which she notes has been affected her pretty much daily. The patient does have a history of venous insufficiency, hypertension, congestive heart failure, and a history of breast cancer. Fortunately there does not appear to be evidence of active infection at this time which is great news. No fevers, chills,  nausea, vomiting, or diarrhea. Wound is not extremely large but does have some slough covering the surface of the wound. This is going require sharp debridement today. 03/15/2020 upon evaluation today patient appears to be doing fairly well in regard to her wound. She does have some slough noted on the surface of the wound currently but I do believe the Iodoflex has been beneficial over the past week. There is no signs of active infection at this time which is great news and overall very pleased with the progress. No fevers, chills, nausea, vomiting, or diarrhea. 04/02/2020 upon evaluation today patient appears to be doing a little better in regard to her wound size wise this is not tremendously smaller she does have some slough buildup on the surface of the wound. With that being said this is going require some sharp debridement today to clear away some of the necrotic debris. Fortunately there is no evidence of active infection at this time. No fevers, chills, nausea, vomiting, or diarrhea. 04/10/2020 on evaluation today patient appears to be doing well with regard to her ulcer which is measuring somewhat smaller today. She actually has more granulation tissue noted which is also good news is no need for sharp debridement today. Fortunately there is no evidence of infection either which is also excellent. No fevers, chills, nausea, vomiting, or diarrhea. 04/26/2020 on evaluation today patient's wound actually is showing signs of good improvement which is great news. There does not appear to be any evidence of active infection. I do believe she is tolerating the collagen at this point. 05/03/2020 upon evaluation today patient's wound actually showed signs of good granulation at this time there does not appear to be any evidence of active infection which is great news and overall very pleased with where things stand. With that being said I do believe that the patient is can require some sharp debridement  today but fortunately nothing too significant. 05/11/20 on evaluation today patient appears to be doing well in regard to her leg ulcer. She's been tolerating the  dressing changes without complication. Fortunately there is no signs of active infection at this time. Overall I'm very pleased with where things stand. 05/18/2020 upon evaluation today patient's wound is showing signs of improvement is very dry and I think the collagen for the most part just dried to the wound bed. I am going to clean this away with sharp debridement in order to get this under control in my opinion. 05/25/2020 upon evaluation today patient actually appears to be doing well in regard to her leg ulcer. In fact upon inspection it appears she could potentially be completely healed but that is not guaranteed based on what we are seeing. There does not appear to be any signs of active infection which is great news. 05/31/2020 on evaluation today patient appears to be doing well with regard to her wounds. She in fact appears to be almost completely healed on the left leg there is just a very small opening remaining point 1 06/08/2020 upon evaluation today patient appears to be doing excellent in regard to her leg ulcer on the left in fact it appears to be that she is completely healed as of today. I see no signs of active infection at this time which is great news. No fevers, chills, nausea, vomiting, or diarrhea. READMISSION 01/16/2021 Patient that we have had in this clinic with a wound on the left posterior calf in the summer 2021 extending into October. She was felt to have chronic venous insufficiency. Per the patient's description she was discharged with what sounds like external compression stockings although she could not afford the "$75 per leg". She therefore did not get anything. Apparently her wound that she has currently is been in existence since January. She has been followed at vein and vascular with Unna boots and I  think calcium alginate. She had ABIs done on 06/25/2020 that showed an noncompressible ABI on the right leg and the left leg but triphasic waveforms with good great toe pressures and a TBI of 0.90 on the right and 0.91 on the left Surprisingly the patient also had venous reflux studies that were really unremarkable. This included no evidence of DVTs or SVTs but there was no evidence of deep venous insufficiency or superficial venous insufficiency in the greater saphenous or short saphenous veins bilaterally. I would wonder about more central venous issues or perhaps this is all lymphedema 01/25/2021 upon evaluation today patient appears to be doing decently well and guard to her wound. The good news is the Iodoflex that is job she saw Dr. Dellia Nims last week for readmission and to be honest I think that this has done extremely well over that week. I do believe that she can tolerate the sharp debridement today which will be good. I think that cleaning off the wound will likely allow [place] to be able to use a different type of dressing I do not think the Iodoflex will even be necessary based on how clean the wound looks. Fortunately there is no sign of active infection at this time which is great news. No fevers, chills, nausea, vomiting, or diarrhea. PAULYNE, MOOTY ([id number]) 02/12/2021 upon evaluation today patient appears to be doing well with regard to her leg ulcer. We did switch to alginate when she came for nurse visit I think is doing much better for her. Fortunately there does not appear to be any signs of active infection at this time which is great news. No fevers, chills, nausea, vomiting, or diarrhea. Objective Constitutional Obese and well-hydrated in  no acute distress. Vitals Time Taken: 2:06 PM, Height: 69 in, Weight: 244 lbs, BMI: 36, Temperature: 98.6 F, Pulse: 59 bpm, Respiratory Rate: 18 breaths/min, Blood Pressure: 144/69 mmHg. Respiratory normal breathing without  difficulty. Psychiatric this patient is able to make decisions and demonstrates good insight into disease process. Alert and Oriented x 3. pleasant and cooperative. General Notes: Upon inspection patient's wound bed actually showed signs of good granulation epithelization at this point there was some slough noted mechanically abraded with saline and gauze no sharp debridement necessary at this point. Integumentary (Hair, Skin) Wound #2 status is Open. Original cause of wound was Gradually Appeared. The date acquired was: 09/08/2020. The wound has been in treatment 3 weeks. The wound is located on the Left,Lateral Lower Leg. The wound measures 3cm length x 4.2cm width x 1cm depth; 9.896cm^2 area and 9.896cm^3 volume. There is Fat Layer (Subcutaneous Tissue) exposed. There is no tunneling or undermining noted. There is a medium amount of serosanguineous drainage noted. There is small (1-33%) red granulation within the wound bed. There is a large (67-100%) amount of necrotic tissue within the wound bed including Adherent Slough. 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 There was a Three Layer Compression Therapy Procedure by Dolan Amen, RN. Post procedure Diagnosis Wound #2: Same as Pre-Procedure Notes: pt tolerating wrap well. Plan Follow-up Appointments: Return Appointment in 1 week. Bathing/ Shower/ Hygiene: May shower with wound dressing protected with water repellent cover or cast protector. JELISA, Redmond ([id number]) Anesthetic (Use 'Patient Medications' Section for Anesthetic Order Entry): Lidocaine applied to wound bed Edema Control - Lymphedema /  Segmental Compressive Device / Other: Optional: One layer of unna paste to top of compression wrap (to act as an anchor). Elevate, Exercise Daily and Avoid Standing for Long Periods of Time. Elevate legs to the level of the heart and pump ankles as often as possible Elevate leg(s) parallel to the floor when sitting. Compression Pump: Use compression pump on left lower extremity for 60 minutes, twice daily. - You may pump over wraps Compression Pump: Use compression pump on right lower extremity for 60 minutes, twice daily. - You may pump over wraps Additional Orders / Instructions: Wound #2 Left,Lateral Lower Leg: Follow Nutritious Diet and Increase Protein Intake - Increase protein intake. WOUND #2: - Lower Leg Wound Laterality: Left, Lateral Cleanser: Soap and Water 1 x Per Week/30 Days Discharge Instructions: Gently cleanse wound with antibacterial soap, rinse and pat dry prior to dressing wounds Topical: Triamcinolone Acetonide Cream, 0.1%, 15 (g) tube 1 x Per Week/30 Days Discharge Instructions: Apply as directed by provider. Primary Dressing: Silvercel Small 2x2 (in/in) 1 x Per Week/30 Days Discharge Instructions: Apply Silvercel Small 2x2 (in/in) as instructed Secondary Dressing: Xtrasorb Medium 4x5 (in/in) 1 x Per Week/30 Days Discharge Instructions: Apply to wound as directed. Do not cut. Compression Wrap: Profore Lite LF 3 Multilayer Compression Bandaging System 1 x Per Week/30 Days Discharge Instructions: Apply 3 multi-layer wrap as prescribed. 1. Would recommend currently that we going continue with wound care measures as before and the patient is in agreement with the plan. This includes the use of the silver alginate dressing which I think is still the best way to go currently. 2. I  am also can recommend at this point that we have the patient continue to monitor for any signs of worsening infection. This would include increased pain. Overall I do not see any signs of this  currently. 3. I am also can recommend that we continue to use a 3 layer compression wrap to help control edema I think that is doing quite well for the patient at this point. We will see patient back for reevaluation in 1 week here in the clinic. If anything worsens or changes patient will contact our office for additional recommendations. Electronic Signature(s) Signed: 02/12/2021 2:55:35 PM By: Worthy Keeler PA-C Entered By: Worthy Keeler on 02/12/2021 14:55:35 [name] ([id number]) -------------------------------------------------------------------------------- SuperBill Details Patient Name: [name] Date of Service: 02/12/2021 Medical Record Number: [id number] Patient Account Number: [id number] Date of Birth/Sex: 1946/02/11 (75 y.o. 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 Facility Procedures CPT4 Code: [id number] Description: (Facility Use Only) [phone] - [place] [facility] Modifier: Quantity: 1 Physician Procedures CPT4 Code Description: [id number] - WC PHYS LEVEL 3 - EST PT Modifier: Quantity: 1 CPT4 Code Description: ICD-10 Diagnosis Description I89.0 Lymphedema, not elsewhere classified I87.332 Chronic venous hypertension (idiopathic) with ulcer and inflammation of le L97.828 Non-pressure chronic ulcer of other part of left lower leg with  other spec Modifier: ft lower extremity ified severity Quantity: Electronic Signature(s) Signed: 02/12/2021 2:55:48 PM By: Worthy Keeler PA-C Entered By: Worthy Keeler on 02/12/2021  14:55:47

## 2021-02-18 ENCOUNTER — Other Ambulatory Visit: Payer: Self-pay

## 2021-02-18 ENCOUNTER — Encounter: Payer: Medicare HMO | Admitting: Physician Assistant

## 2021-02-18 DIAGNOSIS — L97828 Non-pressure chronic ulcer of other part of left lower leg with other specified severity: Secondary | ICD-10-CM | POA: Diagnosis not present

## 2021-02-18 NOTE — Progress Notes (Addendum)
[name], [name] ([id number]) Visit Report for 02/18/2021 Chief Complaint Document Details Patient Name: [name], [name] Date of Service: 02/18/2021 2:00 PM Medical Record Number: [id number] Patient Account Number: [id number] Date of Birth/Sex: 21-Jul-1946 (75 y.o. F) Treating RN: Donnamarie Poag Primary Care Provider: [name] Other Clinician: Referring Provider: [name] Treating Provider/Extender: Skipper Cliche in Treatment: 4 Information Obtained from: Patient Chief Complaint Left LE Ulcer Electronic Signature(s) Signed: 02/18/2021 2:51:41 PM By: Worthy Keeler PA-C Entered By: Worthy Keeler on 02/18/2021 14:51:41 [name] ([id number]) -------------------------------------------------------------------------------- HPI Details Patient Name: [name] Date of Service: 02/18/2021 2:00 PM Medical Record Number: [id number] Patient Account Number: [id number] Date of Birth/Sex: 08-11-1946 (75 y.o. F) Treating RN: Donnamarie Poag Primary Care Provider: [name] Other Clinician: Referring Provider: [name] Treating Provider/Extender: Skipper Cliche in Treatment: 4 History of Present Illness HPI Description: 03/15/2020 upon evaluation today patient presents today for initial evaluation here in our clinic concerning a wound that is on the left posterior lower extremity. Unfortunately this has been giving the patient some discomfort at this point which she notes has been affected her pretty much daily. The patient does have a history of venous insufficiency, hypertension, congestive heart failure, and a history of breast cancer. Fortunately there does not appear to be evidence of active infection at this time which is great news. No fevers, chills, nausea, vomiting, or diarrhea. Wound is not extremely large but does have some slough covering the surface of the wound. This is going require sharp debridement today. 03/15/2020 upon evaluation today patient appears  to be doing fairly well in regard to her wound. She does have some slough noted on the surface of the wound currently but I do believe the Iodoflex has been beneficial over the past week. There is no signs of active infection at this time which is great news and overall very pleased with the progress. No fevers, chills, nausea, vomiting, or diarrhea. 04/02/2020 upon evaluation today patient appears to be doing a little better in regard to her wound size wise this is not tremendously smaller she does have some slough buildup on the surface of the wound. With that being said this is going require some sharp debridement today to clear away some of the necrotic debris. Fortunately there is no evidence of active infection at this time. No fevers, chills, nausea, vomiting, or diarrhea. 04/10/2020 on evaluation today patient appears to be doing well with regard to her ulcer which is measuring somewhat smaller today. She actually has more granulation tissue noted which is also good news is no need for sharp debridement today. Fortunately there is no evidence of infection either which is also excellent. No fevers, chills, nausea, vomiting, or diarrhea. 04/26/2020 on evaluation today patient's wound actually is showing signs of good improvement which is great news. There does not appear to be any evidence of active infection. I do believe she is tolerating the collagen at this point. 05/03/2020 upon evaluation today patient's wound actually showed signs of good granulation at this time there does not appear to be any evidence of active infection which is great news and overall very pleased with where things stand. With that being said I do believe that the patient is can require some sharp debridement today but fortunately nothing too significant. 05/11/20 on evaluation today patient appears to be doing well in regard to her leg ulcer. She's been tolerating the dressing changes without complication. Fortunately there  is no signs of active infection at this time.  Overall I'm very pleased with where things stand. 05/18/2020 upon evaluation today patient's wound is showing signs of improvement is very dry and I think the collagen for the most part just dried to the wound bed. I am going to clean this away with sharp debridement in order to get this under control in my opinion. 05/25/2020 upon evaluation today patient actually appears to be doing well in regard to her leg ulcer. In fact upon inspection it appears she could potentially be completely healed but that is not guaranteed based on what we are seeing. There does not appear to be any signs of active infection which is great news. 05/31/2020 on evaluation today patient appears to be doing well with regard to her wounds. She in fact appears to be almost completely healed on the left leg there is just a very small opening remaining point 1 06/08/2020 upon evaluation today patient appears to be doing excellent in regard to her leg ulcer on the left in fact it appears to be that she is completely healed as of today. I see no signs of active infection at this time which is great news. No fevers, chills, nausea, vomiting, or diarrhea. READMISSION 01/16/2021 Patient that we have had in this clinic with a wound on the left posterior calf in the summer 2021 extending into October. She was felt to have chronic venous insufficiency. Per the patient's description she was discharged with what sounds like external compression stockings although she could not afford the "$75 per leg". She therefore did not get anything. Apparently her wound that she has currently is been in existence since January. She has been followed at vein and vascular with Unna boots and I think calcium alginate. She had ABIs done on 06/25/2020 that showed an noncompressible ABI on the right leg and the left leg but triphasic waveforms with good great toe pressures and a TBI of 0.90 on the right and 0.91  on the left Surprisingly the patient also had venous reflux studies that were really unremarkable. This included no evidence of DVTs or SVTs but there was no evidence of deep venous insufficiency or superficial venous insufficiency in the greater saphenous or short saphenous veins bilaterally. I would wonder about more central venous issues or perhaps this is all lymphedema 01/25/2021 upon evaluation today patient appears to be doing decently well and guard to her wound. The good news is the Iodoflex that is job she saw Dr. Dellia Nims last week for readmission and to be honest I think that this has done extremely well over that week. I do believe that she can tolerate the sharp debridement today which will be good. I think that cleaning off the wound will likely allow [place] to be able to use a different type of dressing I do not think the Iodoflex will even be necessary based on how clean the wound looks. Fortunately there is no sign of active infection at this time which is great news. No fevers, chills, nausea, vomiting, or diarrhea. 02/12/2021 upon evaluation today patient appears to be doing well with regard to her leg ulcer. We did switch to alginate when she came for nurse visit I think is doing much better for her. Fortunately there does not appear to be any signs of active infection at this time which is great news. No fevers, chills, nausea, vomiting, or diarrhea. ANALISIA, KINGSFORD ([id number]) 02/18/2021 upon evaluation today patient's wound is actually showing signs of good improvement. I am very pleased with where things stand  currently. I do not see any signs of active infection which is great news and overall I feel like she is making good progress. The patient likewise is happy that things are doing so well and that this is measuring somewhat smaller. Electronic Signature(s) Signed: 02/18/2021 5:39:10 PM By: Worthy Keeler PA-C Entered By: Worthy Keeler on 02/18/2021 17:39:09 FAELYNN, WYNDER ([id number]) -------------------------------------------------------------------------------- Physical Exam Details Patient Name: [name] Date of Service: 02/18/2021 2:00 PM Medical Record Number: [id number] Patient Account Number: [id number] Date of Birth/Sex: 11-21-1945 (75 y.o. F) Treating RN: Donnamarie Poag Primary Care Provider: [name] Other Clinician: Referring Provider: [name] Treating Provider/Extender: Skipper Cliche in Treatment: 4 Constitutional Well-nourished and well-hydrated in no acute distress. Respiratory normal breathing without difficulty. Psychiatric this patient is able to make decisions and demonstrates good insight into disease process. Alert and Oriented x 3. pleasant and cooperative. Notes Upon inspection patient's wound did not require any sharp debridement I cleared this away with saline and gauze as far as the necrotic debris on the surface of the wound was concerned she tolerated that without complication postdebridement wound bed appears to be doing much better. Electronic Signature(s) Signed: 02/18/2021 5:39:35 PM By: Worthy Keeler PA-C Entered By: Worthy Keeler on 02/18/2021 17:39:34 [name] ([id number]) -------------------------------------------------------------------------------- Physician Orders Details Patient Name: [name] Date of Service: 02/18/2021 2:00 PM Medical Record Number: [id number] Patient Account Number: [id number] Date of Birth/Sex: June 19, 1946 (75 y.o. F) Treating RN: Donnamarie Poag Primary Care Provider: [name] Other Clinician: Referring Provider: [name] Treating Provider/Extender: Skipper Cliche in Treatment: 4 Verbal / Phone Orders: No Diagnosis Coding ICD-10 Coding Code Description I89.0 Lymphedema, not elsewhere classified I87.332 Chronic venous hypertension (idiopathic) with ulcer and inflammation of left lower extremity L97.828 Non-pressure chronic ulcer of  other part of left lower leg with other specified severity Follow-up Appointments o Return Appointment in 1 week. Bathing/ Shower/ Hygiene o May shower with wound dressing protected with water repellent cover or cast protector. Anesthetic (Use 'Patient Medications' Section for Anesthetic Order Entry) o Lidocaine applied to wound bed Edema Control - Lymphedema / Segmental Compressive Device / Other Left Lower Extremity o Optional: One layer of unna paste to top of compression wrap (to act as an anchor). o Elevate, Exercise Daily and Avoid Standing for Long Periods of Time. o Elevate legs to the level of the heart and pump ankles as often as possible o Elevate leg(s) parallel to the floor when sitting. o Compression Pump: Use compression pump on left lower extremity for 60 minutes, twice daily. - You may pump over wraps o Compression Pump: Use compression pump on right lower extremity for 60 minutes, twice daily. - You may pump over wraps Additional Orders / Instructions Wound #2 Left,Lateral Lower Leg o Follow Nutritious Diet and Increase Protein Intake - Increase protein intake. Wound Treatment Wound #2 - Lower Leg Wound Laterality: Left, Lateral Cleanser: Soap and Water 1 x Per Week/30 Days Discharge Instructions: Gently cleanse wound with antibacterial soap, rinse and pat dry prior to dressing wounds Topical: Triamcinolone Acetonide Cream, 0.1%, 15 (g) tube 1 x Per Week/30 Days Discharge Instructions: Apply as directed by provider. Primary Dressing: Silvercel Small 2x2 (in/in) 1 x Per Week/30 Days Discharge Instructions: Apply Silvercel Small 2x2 (in/in) as instructed Secondary Dressing: Xtrasorb Medium 4x5 (in/in) 1 x Per Week/30 Days Discharge Instructions: Apply to wound as directed. Do not cut. Compression Wrap: Profore Lite LF 3 Multilayer Compression Bandaging System 1 x Per Week/30  Days Discharge Instructions: Apply 3 multi-layer wrap as  prescribed. Electronic Signature(s) Signed: 02/18/2021 4:11:38 PM By: Donnamarie Poag Signed: 02/18/2021 5:46:17 PM By: Worthy Keeler PA-C Entered By: Donnamarie Poag on 02/18/2021 14:55:13 [name] ([id number]) -------------------------------------------------------------------------------- Problem List Details Patient Name: [name] Date of Service: 02/18/2021 2:00 PM Medical Record Number: [id number] Patient Account Number: [id number] Date of Birth/Sex: 04-04-1946 (75 y.o. F) Treating RN: Donnamarie Poag Primary Care Provider: [name] Other Clinician: Referring Provider: [name] Treating Provider/Extender: Skipper Cliche in Treatment: 4 Active Problems ICD-10 Encounter Code Description Active Date MDM Diagnosis I89.0 Lymphedema, not elsewhere classified 01/16/2021 No Yes I87.332 Chronic venous hypertension (idiopathic) with ulcer and inflammation of 01/16/2021 No Yes left lower extremity L97.828 Non-pressure chronic ulcer of other part of left lower leg with other 01/16/2021 No Yes specified severity Inactive Problems Resolved Problems Electronic Signature(s) Signed: 02/18/2021 2:51:34 PM By: Worthy Keeler PA-C Entered By: Worthy Keeler on 02/18/2021 14:51:33 [name] ([id number]) -------------------------------------------------------------------------------- Progress Note Details Patient Name: [name] Date of Service: 02/18/2021 2:00 PM Medical Record Number: [id number] Patient Account Number: [id number] Date of Birth/Sex: Feb 05, 1946 (75 y.o. F) Treating RN: Donnamarie Poag Primary Care Provider: [name] Other Clinician: Referring Provider: [name] Treating Provider/Extender: Skipper Cliche in Treatment: 4 Subjective Chief Complaint Information obtained from Patient Left LE Ulcer History of Present Illness (HPI) 03/15/2020 upon evaluation today patient presents today for initial evaluation here in our clinic concerning a wound  that is on the left posterior lower extremity. Unfortunately this has been giving the patient some discomfort at this point which she notes has been affected her pretty much daily. The patient does have a history of venous insufficiency, hypertension, congestive heart failure, and a history of breast cancer. Fortunately there does not appear to be evidence of active infection at this time which is great news. No fevers, chills, nausea, vomiting, or diarrhea. Wound is not extremely large but does have some slough covering the surface of the wound. This is going require sharp debridement today. 03/15/2020 upon evaluation today patient appears to be doing fairly well in regard to her wound. She does have some slough noted on the surface of the wound currently but I do believe the Iodoflex has been beneficial over the past week. There is no signs of active infection at this time which is great news and overall very pleased with the progress. No fevers, chills, nausea, vomiting, or diarrhea. 04/02/2020 upon evaluation today patient appears to be doing a little better in regard to her wound size wise this is not tremendously smaller she does have some slough buildup on the surface of the wound. With that being said this is going require some sharp debridement today to clear away some of the necrotic debris. Fortunately there is no evidence of active infection at this time. No fevers, chills, nausea, vomiting, or diarrhea. 04/10/2020 on evaluation today patient appears to be doing well with regard to her ulcer which is measuring somewhat smaller today. She actually has more granulation tissue noted which is also good news is no need for sharp debridement today. Fortunately there is no evidence of infection either which is also excellent. No fevers, chills, nausea, vomiting, or diarrhea. 04/26/2020 on evaluation today patient's wound actually is showing signs of good improvement which is great news. There does not  appear to be any evidence of active infection. I do believe she is tolerating the collagen at this point. 05/03/2020 upon evaluation today patient's wound actually  showed signs of good granulation at this time there does not appear to be any evidence of active infection which is great news and overall very pleased with where things stand. With that being said I do believe that the patient is can require some sharp debridement today but fortunately nothing too significant. 05/11/20 on evaluation today patient appears to be doing well in regard to her leg ulcer. She's been tolerating the dressing changes without complication. Fortunately there is no signs of active infection at this time. Overall I'm very pleased with where things stand. 05/18/2020 upon evaluation today patient's wound is showing signs of improvement is very dry and I think the collagen for the most part just dried to the wound bed. I am going to clean this away with sharp debridement in order to get this under control in my opinion. 05/25/2020 upon evaluation today patient actually appears to be doing well in regard to her leg ulcer. In fact upon inspection it appears she could potentially be completely healed but that is not guaranteed based on what we are seeing. There does not appear to be any signs of active infection which is great news. 05/31/2020 on evaluation today patient appears to be doing well with regard to her wounds. She in fact appears to be almost completely healed on the left leg there is just a very small opening remaining point 1 06/08/2020 upon evaluation today patient appears to be doing excellent in regard to her leg ulcer on the left in fact it appears to be that she is completely healed as of today. I see no signs of active infection at this time which is great news. No fevers, chills, nausea, vomiting, or diarrhea. READMISSION 01/16/2021 Patient that we have had in this clinic with a wound on the left posterior  calf in the summer 2021 extending into October. She was felt to have chronic venous insufficiency. Per the patient's description she was discharged with what sounds like external compression stockings although she could not afford the "$75 per leg". She therefore did not get anything. Apparently her wound that she has currently is been in existence since January. She has been followed at vein and vascular with Unna boots and I think calcium alginate. She had ABIs done on 06/25/2020 that showed an noncompressible ABI on the right leg and the left leg but triphasic waveforms with good great toe pressures and a TBI of 0.90 on the right and 0.91 on the left Surprisingly the patient also had venous reflux studies that were really unremarkable. This included no evidence of DVTs or SVTs but there was no evidence of deep venous insufficiency or superficial venous insufficiency in the greater saphenous or short saphenous veins bilaterally. I would wonder about more central venous issues or perhaps this is all lymphedema 01/25/2021 upon evaluation today patient appears to be doing decently well and guard to her wound. The good news is the Iodoflex that is job she saw Dr. Dellia Nims last week for readmission and to be honest I think that this has done extremely well over that week. I do believe that she can tolerate the sharp debridement today which will be good. I think that cleaning off the wound will likely allow [place] to be able to use a different type of dressing I do not think the Iodoflex will even be necessary based on how clean the wound looks. Fortunately there is no sign of active infection at this time which is great news. No fevers, chills, nausea,  vomiting, or diarrhea. MARISAL, SWAREY ([id number]) 02/12/2021 upon evaluation today patient appears to be doing well with regard to her leg ulcer. We did switch to alginate when she came for nurse visit I think is doing much better for her. Fortunately there  does not appear to be any signs of active infection at this time which is great news. No fevers, chills, nausea, vomiting, or diarrhea. 02/18/2021 upon evaluation today patient's wound is actually showing signs of good improvement. I am very pleased with where things stand currently. I do not see any signs of active infection which is great news and overall I feel like she is making good progress. The patient likewise is happy that things are doing so well and that this is measuring somewhat smaller. Objective Constitutional Well-nourished and well-hydrated in no acute distress. Vitals Time Taken: 2:33 PM, Height: 69 in, Weight: 244 lbs, BMI: 36, Temperature: 99.9 F, Pulse: 65 bpm, Respiratory Rate: 18 breaths/min, Blood Pressure: 144/77 mmHg. General Notes: Temp 97.9 on retake at 1450 Respiratory normal breathing without difficulty. Psychiatric this patient is able to make decisions and demonstrates good insight into disease process. Alert and Oriented x 3. pleasant and cooperative. General Notes: Upon inspection patient's wound did not require any sharp debridement I cleared this away with saline and gauze as far as the necrotic debris on the surface of the wound was concerned she tolerated that without complication postdebridement wound bed appears to be doing much better. Integumentary (Hair, Skin) Wound #2 status is Open. Original cause of wound was Gradually Appeared. The date acquired was: 09/08/2020. The wound has been in treatment 4 weeks. The wound is located on the Left,Lateral Lower Leg. The wound measures 2.1cm length x 4cm width x 0.2cm depth; 6.597cm^2 area and 1.319cm^3 volume. There is Fat Layer (Subcutaneous Tissue) exposed. There is no tunneling or undermining noted. There is a large amount of serosanguineous drainage noted. There is medium (34-66%) granulation within the wound bed. There is a medium (34-66%) amount of necrotic tissue within the wound bed including Adherent  Slough. 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 There was a Three Layer Compression Therapy Procedure by Donnamarie Poag, RN. Post procedure Diagnosis Wound #2: Same as Pre-Procedure SHEQUITA, PEPLINSKI ([id number]) Plan Follow-up Appointments: Return Appointment in 1 week. Bathing/ Shower/ Hygiene: May shower with wound dressing protected with water repellent cover or cast protector. Anesthetic (Use 'Patient Medications' Section for Anesthetic Order Entry): Lidocaine applied to wound bed Edema Control - Lymphedema / Segmental Compressive Device / Other: Optional: One layer of unna paste to top of compression wrap (to act as an anchor). Elevate, Exercise Daily and Avoid Standing for Long Periods of Time. Elevate legs to the level of the heart and pump ankles as often as possible Elevate leg(s) parallel to the floor when sitting. Compression Pump: Use compression pump on left lower extremity for 60 minutes, twice daily. - You may pump over wraps Compression Pump: Use compression pump on right lower extremity for 60 minutes, twice daily. - You may pump over wraps Additional Orders / Instructions: Wound #2 Left,Lateral Lower Leg: Follow Nutritious Diet and Increase Protein Intake - Increase protein intake. WOUND #2: - Lower Leg Wound Laterality: Left, Lateral Cleanser: Soap and  Water 1 x Per Week/30 Days Discharge Instructions: Gently cleanse wound with antibacterial soap, rinse and pat dry prior to dressing wounds Topical: Triamcinolone Acetonide Cream, 0.1%, 15 (g) tube 1 x Per Week/30 Days Discharge Instructions: Apply as directed by provider. Primary Dressing:  Silvercel Small 2x2 (in/in) 1 x Per Week/30 Days Discharge Instructions: Apply Silvercel Small 2x2 (in/in) as instructed Secondary Dressing: Xtrasorb Medium 4x5 (in/in) 1 x Per Week/30 Days Discharge Instructions: Apply to wound as directed. Do not cut. Compression Wrap: Profore Lite LF 3 Multilayer Compression Bandaging System 1 x Per Week/30 Days Discharge Instructions: Apply 3 multi-layer wrap as prescribed. 1. Would recommend currently that we go ahead and continue with the wound care measures as before and the patient is in agreement the plan this includes the use of the triamcinolone around the leg followed by silver cell. 2. We will cover this with XtraSorb and then a 3 layer compression wrap. 3. She should continue to elevate her legs much as possible to try to help with edema control. We will see patient back for reevaluation in 1 week here in the clinic. If anything worsens or changes patient will contact our office for additional recommendations. Electronic Signature(s) Signed: 02/18/2021 5:40:04 PM By: Worthy Keeler PA-C Entered By: Worthy Keeler on 02/18/2021 17:40:03 [name] ([id number]) -------------------------------------------------------------------------------- SuperBill Details Patient Name: [name] Date of Service: 02/18/2021 Medical Record Number: [id number] Patient Account Number: [id number] Date of Birth/Sex: 1946-01-10 (75 y.o. 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 Facility Procedures CPT4 Code: 01093235 Description:  (Facility Use Only) [phone] - [facility] LT LEG Modifier: Quantity: 1 Physician Procedures CPT4 Code Description: [id number] - WC PHYS LEVEL 3 - EST PT Modifier: Quantity: 1 CPT4 Code Description: ICD-10 Diagnosis Description I89.0 Lymphedema, not elsewhere classified I87.332 Chronic venous hypertension (idiopathic) with ulcer and inflammation of le L97.828 Non-pressure chronic ulcer of other part of left lower leg with  other spec Modifier: ft lower extremity ified severity Quantity: Electronic Signature(s) Signed: 02/18/2021 5:40:16 PM By: Worthy Keeler PA-C Previous Signature: 02/18/2021 4:11:38 PM Version By: Donnamarie Poag Entered By: Worthy Keeler on 02/18/2021 17:40:16

## 2021-02-21 NOTE — Progress Notes (Signed)
[name], [name] ([id number]) Visit Report for 02/18/2021 Arrival Information Details Patient Name: [name], [name] Date of Service: 02/18/2021 2:00 PM Medical Record Number: [id number] Patient Account Number: [id number] Date of Birth/Sex: 1946-04-02 (75 y.o. F) Treating RN: Donnamarie Poag Primary Care Maliha Outten: [name] Other Clinician: Referring T[place]ia Harral: [name] Treating Shelda Truby/Extender: Skipper Cliche in Treatment: 4 Visit Information History Since Last Visit Added or deleted any medications: Yes Patient Arrived: Wheel Chair Had a fall or experienced change in Yes Arrival Time: 14:32 activities of daily living that may affect Accompanied By: self risk of falls: Transfer Assistance: None Hospitalized since last visit: No Patient Identification Verified: Yes Pain Present Now: No Secondary Verification Process Completed: Yes Patient Requires Transmission-Based Precautions: No Patient Has Alerts: Yes Patient Alerts: NOT DIABETIC Electronic Signature(s) Signed: 02/21/2021 3:50:18 PM By: Jeanine Luz Entered By: Jeanine Luz on 02/18/2021 14:34:43 [name] ([id number]) -------------------------------------------------------------------------------- Clinic Level of Care Assessment Details Patient Name: [name] Date of Service: 02/18/2021 2:00 PM Medical Record Number: [id number] Patient Account Number: [id number] Date of Birth/Sex: 1946-03-01 (75 y.o. F) Treating RN: Donnamarie Poag Primary Care Jenniferlynn Saad: [name] Other Clinician: Referring Dion Sibal: [name] Treating Lenus Trauger/Extender: Skipper Cliche in Treatment: 4 Clinic Level of Care Assessment Items TOOL 1 Quantity Score []  - Use when EandM and Procedure is performed on INITIAL visit 0 ASSESSMENTS - Nursing Assessment / Reassessment []  - General Physical Exam (combine w/ comprehensive assessment (listed just below) when performed on new 0 pt. evals) []  - 0 Comprehensive  Assessment (HX, ROS, Risk Assessments, Wounds Hx, etc.) ASSESSMENTS - Wound and Skin Assessment / Reassessment []  - Dermatologic / Skin Assessment (not related to wound area) 0 ASSESSMENTS - Ostomy and/or Continence Assessment and Care []  - Incontinence Assessment and Management 0 []  - 0 Ostomy Care Assessment and Management (repouching, etc.) PROCESS - Coordination of Care []  - Simple Patient / Family Education for ongoing care 0 []  - 0 Complex (extensive) Patient / Family Education for ongoing care []  - 0 Staff obtains Programmer, systems, Records, Test Results / Process Orders []  - 0 Staff telephones HHA, Nursing Homes / Clarify orders / etc []  - 0 Routine Transfer to another Facility (non-emergent condition) []  - 0 Routine Hospital Admission (non-emergent condition) []  - 0 New Admissions / Biomedical engineer / Ordering NPWT, Apligraf, etc. []  - 0 Emergency Hospital Admission (emergent condition) PROCESS - Special Needs []  - Pediatric / Minor Patient Management 0 []  - 0 Isolation Patient Management []  - 0 Hearing / Language / Visual special needs []  - 0 Assessment of Community assistance (transportation, D/C planning, etc.) []  - 0 Additional assistance / Altered mentation []  - 0 Support Surface(s) Assessment (bed, cushion, seat, etc.) INTERVENTIONS - Miscellaneous []  - External ear exam 0 []  - 0 Patient Transfer (multiple staff / Civil Service fast streamer / Similar devices) []  - 0 Simple Staple / Suture removal (25 or less) []  - 0 Complex Staple / Suture removal (26 or more) []  - 0 Hypo/Hyperglycemic Management (do not check if billed separately) []  - 0 Ankle / Brachial Index (ABI) - do not check if billed separately Has the patient been seen at the hospital within the last three years: Yes Total Score: 0 Level Of Care: ____ [name] ([id number]) Electronic Signature(s) Signed: 02/18/2021 4:11:38 PM By: Donnamarie Poag Entered By: Donnamarie Poag on 02/18/2021 14:55:38 [name] ([id number]) -------------------------------------------------------------------------------- Compression Therapy Details Patient Name: [name] Date of Service: 02/18/2021 2:00 PM Medical Record Number: [id number] Patient Account Number: [id number] Date of Birth/Sex:  1946-07-24 (75 y.o. F) Treating RN: Donnamarie Poag Primary Care Ashwika Freels: [name] Other Clinician: Referring Jarrod Mcenery: [name] Treating Matisse Salais/Extender: Skipper Cliche in Treatment: 4 Compression Therapy Performed for Wound Assessment: Wound #2 Left,Lateral Lower Leg Performed By: Clinician Donnamarie Poag, RN Compression Type: Three Layer Post Procedure Diagnosis Same as Pre-procedure Electronic Signature(s) Signed: 02/18/2021 4:11:38 PM By: Donnamarie Poag Entered By: Donnamarie Poag on 02/18/2021 14:55:30 [name] ([id number]) -------------------------------------------------------------------------------- Encounter Discharge Information Details Patient Name: [name] Date of Service: 02/18/2021 2:00 PM Medical Record Number: [id number] Patient Account Number: [id number] Date of Birth/Sex: 07-24-1946 (75 y.o. F) Treating RN: Carlene Coria Primary Care Westyn Keatley: [name] Other Clinician: Referring Catharine Kettlewell: [name] Treating Herman Mell/Extender: Skipper Cliche in Treatment: 4 Encounter Discharge Information Items Discharge Condition: Stable Ambulatory Status: Wheelchair Discharge Destination: Home Transportation: Private Auto Accompanied By: self Schedule Follow-up Appointment: Yes Clinical Summary of Care: Patient Declined Electronic Signature(s) Signed: 02/20/2021 3:34:38 PM By: Carlene Coria RN Entered By: Carlene Coria on 02/18/2021 15:15:15 [name] ([id number]) -------------------------------------------------------------------------------- Lower Extremity Assessment Details Patient Name: [name] Date of Service: 02/18/2021 2:00 PM Medical Record  Number: [id number] Patient Account Number: [id number] Date of Birth/Sex: [birth year] (75 y.o. F) Treating RN: Donnamarie Poag Primary Care Sharonda Llamas: [name] Other Clinician: Referring Mareena Cavan: [name] Treating Keven Soucy/Extender: Skipper Cliche in Treatment: 4 Edema Assessment Assessed: [Left: Yes] [Right: No] Edema: [Left: Ye] [Right: s] Calf Left: Right: Point of Measurement: 35 cm From Medial Instep 45.4 cm Ankle Left: Right: Point of Measurement: 10 cm From Medial Instep 26 cm Vascular Assessment Pulses: Dorsalis Pedis Palpable: [Left:Yes] Electronic Signature(s) Signed: 02/18/2021 4:11:38 PM By: Donnamarie Poag Signed: 02/21/2021 3:50:18 PM By: Jeanine Luz Entered By: Jeanine Luz on 02/18/2021 14:47:33 [name] ([id number]) -------------------------------------------------------------------------------- Multi Wound Chart Details Patient Name: [name] Date of Service: 02/18/2021 2:00 PM Medical Record Number: [id number] Patient Account Number: [id number] Date of Birth/Sex: [birth year] (75 y.o. F) Treating RN: Donnamarie Poag Primary Care Jaeanna Mccomber: [name] Other Clinician: Referring Chareese Sergent: [name] Treating Kanoa Phillippi/Extender: Skipper Cliche in Treatment: 4 Vital Signs Height(in): 69 Pulse(bpm): 65 Weight(lbs): 244 Blood Pressure(mmHg): 144/77 Body Mass Index(BMI): 36 Temperature(F): 99.9 Respiratory Rate(breaths/min): 18 Photos: [N/A:N/A] Wound Location: Left, Lateral Lower Leg N/A N/A Wounding Event: Gradually Appeared N/A N/A Primary Etiology: Venous Leg Ulcer N/A N/A Comorbid History: Cataracts, Lymphedema, Sleep N/A N/A Apnea, Congestive Heart Failure, Hypertension, Osteoarthritis, Neuropathy, Received Chemotherapy, Received Radiation Date Acquired: 09/08/2020 N/A N/A Weeks of Treatment: 4 N/A N/A Wound Status: Open N/A N/A Measurements L x W x D (cm) 2.1x4x0.2 N/A N/A Area (cm) : 6.597 N/A N/A Volume (cm) : 1.319 N/A  N/A % Reduction in Area: -40.00% N/A N/A % Reduction in Volume: -180.00% N/A N/A Classification: Full Thickness Without Exposed N/A N/A Support Structures Exudate Amount: Large N/A N/A Exudate Type: Serosanguineous N/A N/A Exudate Color: red, brown N/A N/A Granulation Amount: Medium (34-66%) N/A N/A Necrotic Amount: Medium (34-66%) N/A N/A Exposed Structures: Fat Layer (Subcutaneous Tissue): N/A N/A Yes Fascia: No Tendon: No Muscle: No Joint: No Bone: No Epithelialization: Medium (34-66%) N/A N/A Treatment Notes Electronic Signature(s) Signed: 02/18/2021 4:11:38 PM By: Donnamarie Poag Entered By: Donnamarie Poag on 02/18/2021 14:51:50 [name] ([id number]) -------------------------------------------------------------------------------- [place] Details Patient Name: [name] Date of Service: 02/18/2021 2:00 PM Medical Record Number: [id number] Patient Account Number: [id number] Date of Birth/Sex: [birth year] (75 y.o. F) Treating RN: Donnamarie Poag Primary Care Neela Zecca: [name] Other Clinician: Referring Erico Stan: [name] Treating Melony Tenpas/Extender: Skipper Cliche in  Treatment: 4 Active Inactive Wound/Skin Impairment Nursing Diagnoses: Impaired tissue integrity Goals: Patient/caregiver will verbalize understanding of skin care regimen Date Initiated: 01/16/2021 Date Inactivated: 02/18/2021 Target Resolution Date: 02/16/2021 Goal Status: Met Ulcer/skin breakdown will have a volume reduction of 30% by week 4 Date Initiated: 01/16/2021 Date Inactivated: 02/18/2021 Target Resolution Date: 02/16/2021 Goal Status: Met Ulcer/skin breakdown will have a volume reduction of 50% by week 8 Date Initiated: 01/16/2021 Target Resolution Date: 03/18/2021 Goal Status: Active Interventions: Assess ulceration(s) every visit Treatment Activities: Topical wound management initiated : 01/16/2021 Notes: Electronic Signature(s) Signed: 02/18/2021 4:11:38 PM  By: Donnamarie Poag Entered By: Donnamarie Poag on 02/18/2021 14:52:21 [name] ([id number]) -------------------------------------------------------------------------------- Pain Assessment Details Patient Name: [name] Date of Service: 02/18/2021 2:00 PM Medical Record Number: [id number] Patient Account Number: [id number] Date of Birth/Sex: 1945/11/10 (75 y.o. F) Treating RN: Donnamarie Poag Primary Care Keonta Alsip: [name] Other Clinician: Referring Brownie Nehme: [name] Treating Darya Bigler/Extender: Skipper Cliche in Treatment: 4 Active Problems Location of Pain Severity and Description of Pain Patient Has Paino No Site Locations Rate the pain. Current Pain Level: 0 Pain Management and Medication Current Pain Management: Electronic Signature(s) Signed: 02/18/2021 4:11:38 PM By: Donnamarie Poag Signed: 02/21/2021 3:50:18 PM By: Jeanine Luz Entered By: Jeanine Luz on 02/18/2021 14:36:50 [name] ([id number]) -------------------------------------------------------------------------------- Patient/Caregiver Education Details Patient Name: [name] Date of Service: 02/18/2021 2:00 PM Medical Record Number: [id number] Patient Account Number: [id number] Date of Birth/Gender: May 16, 1946 (75 y.o. F) Treating RN: Donnamarie Poag Primary Care Physician: [name] Other Clinician: Referring Physician: [name] Treating Physician/Extender: Skipper Cliche in Treatment: 4 Education Assessment Education Provided To: Patient Education Topics Provided Nutrition: Venous: Wound/Skin Impairment: Electronic Signature(s) Signed: 02/18/2021 4:11:38 PM By: Donnamarie Poag Entered By: Donnamarie Poag on 02/18/2021 14:53:31 [name] ([id number]) -------------------------------------------------------------------------------- Wound Assessment Details Patient Name: [name] Date of Service: 02/18/2021 2:00 PM Medical Record Number: [id number] Patient  Account Number: [id number] Date of Birth/Sex: 1945-10-14 (75 y.o. F) Treating RN: Donnamarie Poag Primary Care Kyomi Hector: [name] Other Clinician: Referring Heavenleigh Petruzzi: [name] Treating Davonda Ausley/Extender: Skipper Cliche in Treatment: 4 Wound Status Wound Number: 2 Primary Venous Leg Ulcer Etiology: Wound Location: Left, Lateral Lower Leg Wound Open Wounding Event: Gradually Appeared Status: Date Acquired: 09/08/2020 Comorbid Cataracts, Lymphedema, Sleep Apnea, Congestive Heart Weeks Of Treatment: 4 History: Failure, Hypertension, Osteoarthritis, Neuropathy, Clustered Wound: No Received Chemotherapy, Received Radiation Photos Wound Measurements Length: (cm) 2.1 Width: (cm) 4 Depth: (cm) 0.2 Area: (cm) 6.597 Volume: (cm) 1.319 % Reduction in Area: -40% % Reduction in Volume: -180% Epithelialization: Medium (34-66%) Tunneling: No Undermining: No Wound Description Classification: Full Thickness Without Exposed Support Structu Exudate Amount: Large Exudate Type: Serosanguineous Exudate Color: red, brown res Foul Odor After Cleansing: No Slough/Fibrino Yes Wound Bed Granulation Amount: Medium (34-66%) Exposed Structure Necrotic Amount: Medium (34-66%) Fascia Exposed: No Necrotic Quality: Adherent Slough Fat Layer (Subcutaneous Tissue) Exposed: Yes Tendon Exposed: No Muscle Exposed: No Joint Exposed: No Bone Exposed: No Treatment Notes Wound #2 (Lower Leg) Wound Laterality: Left, Lateral Cleanser Soap and Water Discharge Instruction: Gently cleanse wound with antibacterial soap, rinse and pat dry prior to dressing wounds Peri-Wound Care [place], Octivia ([id number]) Topical Triamcinolone Acetonide Cream, 0.1%, 15 (g) tube Discharge Instruction: Apply as directed by Jens Siems. Primary Dressing Silvercel Small 2x2 (in/in) Discharge Instruction: Apply Silvercel Small 2x2 (in/in) as instructed Secondary Dressing Xtrasorb Medium 4x5 (in/in) Discharge  Instruction: Apply to wound as directed. Do not cut. Secured With Compression Wrap Profore Lite LF 3 Multilayer Compression Bandaging  System Discharge Instruction: Apply 3 multi-layer wrap as prescribed. Compression Stockings Add-Ons Electronic Signature(s) Signed: 02/18/2021 4:11:38 PM By: Donnamarie Poag Signed: 02/21/2021 3:50:18 PM By: Jeanine Luz Entered By: Jeanine Luz on 02/18/2021 14:45:28 [name] ([id number]) -------------------------------------------------------------------------------- Vitals Details Patient Name: [name] Date of Service: 02/18/2021 2:00 PM Medical Record Number: [id number] Patient Account Number: [id number] Date of Birth/Sex: 11-17-1945 (75 y.o. F) Treating RN: Donnamarie Poag Primary Care Cobi Delph: [name] Other Clinician: Referring Brenae Lasecki: [name] Treating Tamina Cyphers/Extender: Skipper Cliche in Treatment: 4 Vital Signs Time Taken: 14:33 Temperature (F): 99.9 Height (in): 69 Pulse (bpm): 65 Weight (lbs): 244 Respiratory Rate (breaths/min): 18 Body Mass Index (BMI): 36 Blood Pressure (mmHg): 144/77 Reference Range: 80 - 120 mg / dl Notes Temp 97.9 on retake at 1450 Electronic Signature(s) Signed: 02/18/2021 4:11:38 PM By: Donnamarie Poag Entered ByDonnamarie Poag on 02/18/2021 14:52:52

## 2021-02-22 ENCOUNTER — Inpatient Hospital Stay: Payer: Medicare HMO | Attending: Oncology

## 2021-02-22 ENCOUNTER — Encounter: Payer: Self-pay | Admitting: Oncology

## 2021-02-22 ENCOUNTER — Inpatient Hospital Stay (HOSPITAL_BASED_OUTPATIENT_CLINIC_OR_DEPARTMENT_OTHER): Payer: Medicare HMO | Admitting: Oncology

## 2021-02-22 VITALS — BP 146/77 | HR 54 | Temp 97.0°F

## 2021-02-22 DIAGNOSIS — Z83518 Family history of other specified eye disorder: Secondary | ICD-10-CM | POA: Diagnosis not present

## 2021-02-22 DIAGNOSIS — Z8249 Family history of ischemic heart disease and other diseases of the circulatory system: Secondary | ICD-10-CM | POA: Insufficient documentation

## 2021-02-22 DIAGNOSIS — C50411 Malignant neoplasm of upper-outer quadrant of right female breast: Secondary | ICD-10-CM | POA: Insufficient documentation

## 2021-02-22 DIAGNOSIS — C50911 Malignant neoplasm of unspecified site of right female breast: Secondary | ICD-10-CM | POA: Diagnosis not present

## 2021-02-22 DIAGNOSIS — Z1231 Encounter for screening mammogram for malignant neoplasm of breast: Secondary | ICD-10-CM | POA: Diagnosis not present

## 2021-02-22 DIAGNOSIS — Z803 Family history of malignant neoplasm of breast: Secondary | ICD-10-CM | POA: Insufficient documentation

## 2021-02-22 DIAGNOSIS — F1290 Cannabis use, unspecified, uncomplicated: Secondary | ICD-10-CM | POA: Diagnosis not present

## 2021-02-22 DIAGNOSIS — Z9221 Personal history of antineoplastic chemotherapy: Secondary | ICD-10-CM | POA: Insufficient documentation

## 2021-02-22 DIAGNOSIS — Z79899 Other long term (current) drug therapy: Secondary | ICD-10-CM | POA: Diagnosis not present

## 2021-02-22 DIAGNOSIS — R5383 Other fatigue: Secondary | ICD-10-CM | POA: Insufficient documentation

## 2021-02-22 DIAGNOSIS — Z79811 Long term (current) use of aromatase inhibitors: Secondary | ICD-10-CM | POA: Diagnosis not present

## 2021-02-22 DIAGNOSIS — Z171 Estrogen receptor negative status [ER-]: Secondary | ICD-10-CM | POA: Insufficient documentation

## 2021-02-22 DIAGNOSIS — Z923 Personal history of irradiation: Secondary | ICD-10-CM | POA: Diagnosis not present

## 2021-02-22 DIAGNOSIS — M7989 Other specified soft tissue disorders: Secondary | ICD-10-CM | POA: Insufficient documentation

## 2021-02-22 DIAGNOSIS — L97929 Non-pressure chronic ulcer of unspecified part of left lower leg with unspecified severity: Secondary | ICD-10-CM | POA: Diagnosis not present

## 2021-02-22 DIAGNOSIS — I872 Venous insufficiency (chronic) (peripheral): Secondary | ICD-10-CM | POA: Diagnosis not present

## 2021-02-22 DIAGNOSIS — I10 Essential (primary) hypertension: Secondary | ICD-10-CM | POA: Insufficient documentation

## 2021-02-22 LAB — COMPREHENSIVE METABOLIC PANEL
ALT: 15 U/L (ref 0–44)
AST: 18 U/L (ref 15–41)
Albumin: 3.7 g/dL (ref 3.5–5.0)
Alkaline Phosphatase: 55 U/L (ref 38–126)
Anion gap: 9 (ref 5–15)
BUN: 29 mg/dL — ABNORMAL HIGH (ref 8–23)
CO2: 29 mmol/L (ref 22–32)
Calcium: 9 mg/dL (ref 8.9–10.3)
Chloride: 101 mmol/L (ref 98–111)
Creatinine, Ser: 1.2 mg/dL — ABNORMAL HIGH (ref 0.44–1.00)
GFR, Estimated: 47 mL/min — ABNORMAL LOW (ref >60)
Glucose, Bld: 90 mg/dL (ref 70–99)
Potassium: 4 mmol/L (ref 3.5–5.1)
Sodium: 139 mmol/L (ref 135–145)
Total Bilirubin: 0.6 mg/dL (ref 0.3–1.2)
Total Protein: 7.7 g/dL (ref 6.5–8.1)

## 2021-02-22 LAB — CBC WITH DIFFERENTIAL/PLATELET
Abs Immature Granulocytes: 0.01 10*3/uL (ref 0.00–0.07)
Basophils Absolute: 0 10*3/uL (ref 0.0–0.1)
Basophils Relative: 1 %
Eosinophils Absolute: 0.2 10*3/uL (ref 0.0–0.5)
Eosinophils Relative: 3 %
HCT: 35.2 % — ABNORMAL LOW (ref 36.0–46.0)
Hemoglobin: 11.2 g/dL — ABNORMAL LOW (ref 12.0–15.0)
Immature Granulocytes: 0 %
Lymphocytes Relative: 23 %
Lymphs Abs: 1.3 10*3/uL (ref 0.7–4.0)
MCH: 27.9 pg (ref 26.0–34.0)
MCHC: 31.8 g/dL (ref 30.0–36.0)
MCV: 87.8 fL (ref 80.0–100.0)
Monocytes Absolute: 0.5 10*3/uL (ref 0.1–1.0)
Monocytes Relative: 8 %
Neutro Abs: 3.7 10*3/uL (ref 1.7–7.7)
Neutrophils Relative %: 65 %
Platelets: 273 10*3/uL (ref 150–400)
RBC: 4.01 MIL/uL (ref 3.87–5.11)
RDW: 13.2 % (ref 11.5–15.5)
WBC: 5.7 10*3/uL (ref 4.0–10.5)
nRBC: 0 % (ref 0.0–0.2)

## 2021-02-22 NOTE — Progress Notes (Signed)
Patient here today for follow up regarding breast cancer. Patient reports she is going to wound care center for wound to left lower leg.

## 2021-02-22 NOTE — Progress Notes (Signed)
Hematology/Oncology Consult note [facility]  [phone] [phone]  Patient Care Team: Donnie Coffin, MD as PCP - General (Family Medicine) Minna Merritts, MD as Consulting Physician (Cardiology)   Name of the patient: [name]  [id number]  October 21, 1945   Date of visit: 02/26/21  Diagnosis- stage I right breast cancer  Chief complaint/ Reason for visit-routine follow-up of breast cancer  Heme/Onc history:  patient is a 75 year old female with a prior history of left breast cancer in 2006.  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  She has been in remission since then.  She has been getting periodic mammograms and the most recent mammogram and ultrasound in August 2019 showed 0.9 x 0.9 x 0.8 cm irregular spiculated mass in the 11 o'clock position of the right breast.  There was a concern for an abnormal intramammary lymph node as well.  No other abnormal axillary adenopathy noted.  Patient underwent ultrasound-guided biopsy of both the right breast mass as well as the axillary lymph node lymph node biopsy was negative for malignancy.  Biopsy of the right breast mass showed 6 mm, grade 1 ER PR positive and HER-2/neu negative tumor.   Patient is morbidly obese and has difficulty with ambulation.  She uses a cane or a walker to ambulate around the house.  She sees Dr. Rockey Situ for her cardiology issues.  Reports that her appetite is good and she denies any unintentional weight loss or any new aches or pains anywhere.  Family history significant for breast cancer in her sister who died from breast cancer in her 46s.  genetic testing was negative   Final pathology showed 7 mm grade 2 invasive mammary carcinoma ER PR positive her 2 negative. Negative nodes. Clear margins   Oncotype score came as low risk 8 and she does not require adjuvant chemotherapy She completed adjuvant radiation  and started Arimidex in December 2019   Interval history-she remains compliant with Arimidex. She lives with her daughter and granddaughters and is hoping to move out on her own. She has chronic lower extremity swelling and venous insufficiency and sees wound care weekly for an ulcer on her left leg. Has concerns of possible injury to her right leg. She has una boots at home that she wears inconsistently.   ECOG PS- 2 Pain scale- 3  Review of systems- Review of Systems  Constitutional:  Positive for malaise/fatigue. Negative for chills, fever and weight loss.  HENT:  Negative for congestion, ear discharge and nosebleeds.   Eyes:  Negative for blurred vision.  Respiratory:  Negative for cough, hemoptysis, sputum production, shortness of breath and wheezing.   Cardiovascular:  Positive for leg swelling. Negative for chest pain, palpitations, orthopnea and claudication.  Gastrointestinal:  Negative for abdominal pain, blood in stool, constipation, diarrhea, heartburn, melena, nausea and vomiting.  Genitourinary:  Negative for dysuria, flank pain, frequency, hematuria and urgency.  Musculoskeletal:  Negative for back pain, joint pain and myalgias.  Skin:  Negative for rash.       Leg leg wrap and DTI to right leg   Neurological:  Negative for dizziness, tingling, focal weakness, seizures, weakness and headaches.  Endo/Heme/Allergies:  Does not bruise/bleed easily.  Psychiatric/Behavioral:  Negative for depression and suicidal ideas. The patient does not have insomnia.      No Known Allergies   Past Medical History:  Diagnosis Date   Abnormality of gait  Breast cancer ([place]) 02/05/2005   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    Breast cancer ([place]) 2019   right breast   CHF (congestive heart failure) ([place])    March 28, 2017 echo: EF 50-55%; 1) echo 07/2011: EF 20-25%, mild concentric hypertrophy, diffuse  HK, regional WMA cannot be excluded, grade 1 DD, mitral valve with mild regurg, LA mildly dilated). 2) EF 40-45%, mild AS,                     Complication of anesthesia 2006   pt states "hard to wake up" after L breast surgery    Edema    GERD (gastroesophageal reflux disease)    Headache    Heart murmur    Hypertension    Hypokinesis    global, EF 35-45 % from Echo.   Malignant neoplasm of breast (female), unspecified site 06/14/2018   RIGHT T1b, N0; ER/PR positive, HER-2 negative.   Neuropathy    Obesity    Pain in joint, site unspecified    Personal history of chemotherapy 2006   left breast   Personal history of radiation therapy 2019   right breast   Personal history of radiation therapy 2006   left breast   Pneumonia    Sleep apnea    Tinea pedis    Unspecified hearing loss    bilateral     Past Surgical History:  Procedure Laterality Date   BREAST BIOPSY Right 2019   11 oc-INVASIVE MAMMARY CARCINOMA, NO SPECIAL TYPE   BREAST BIOPSY Right 2019   1030 oc- neg   BREAST LUMPECTOMY  2006   left breast    CATARACT EXTRACTION     left eye   HERNIA REPAIR  05/25/2006   Incarcerated omentum in ventral hernia, Ventralight mesh, combined lap/ open with omental resection.    PARTIAL MASTECTOMY WITH NEEDLE LOCALIZATION Right 06/14/2018   Procedure: PARTIAL MASTECTOMY WITH NEEDLE LOCALIZATION;  Surgeon: Robert Bellow, MD;  Location: [facility] ORS;  Service: General;  Laterality: Right;   PORT-A-CATH REMOVAL  05/25/2006   SENTINEL NODE BIOPSY Right 06/14/2018   Procedure: SENTINEL NODE BIOPSY;  Surgeon: Robert Bellow, MD;  Location: [facility] ORS;  Service: General;  Laterality: Right;   TUBAL LIGATION      Social History   Socioeconomic History   Marital status: Divorced    Spouse name: Not on file   Number of children: Not on file   Years of education: Not on file   Highest education level: Not on file  Occupational History   Occupation: retired  Tobacco Use    Smoking status: Former    Packs/day: 0.50    Years: 30.00    Pack years: 15.00    Types: Cigarettes    Quit date: 07/14/2000    Years since quitting: 20.6   Smokeless tobacco: Never  Vaping Use   Vaping Use: Never used  Substance and Sexual Activity   Alcohol use: No   Drug use: Not Currently    Types: Marijuana    Comment: endorses prior marijuana use 1999   Sexual activity: Never  Other Topics Concern   Not on file  Social History Narrative   Not on file   Social Determinants of Health   Financial Resource Strain: Not on file  Food Insecurity: Not on file  Transportation Needs: Not on file  Physical Activity: Not on file  Stress: Not on  file  Social Connections: Not on file  Intimate Partner Violence: Not on file    Family History  Problem Relation Age of Onset   Hypertension Mother        deceased 54   Cataracts Mother    Breast cancer Other 61       maternal half-sister; deceased 67   Coronary artery disease Other      Current Outpatient Medications:    anastrozole (ARIMIDEX) 1 MG tablet, TAKE 1 TABLET BY MOUTH DAILY. PATIENT MUST MAKE AN APPT TO SEE DR RAO, Disp: 90 tablet, Rfl: 0   aspirin EC 81 MG tablet, Take 81 mg by mouth daily., Disp: , Rfl:    cloNIDine (CATAPRES) 0.2 MG tablet, Take 1 tablet (0.2 mg total) by mouth 2 (two) times daily., Disp: 60 tablet, Rfl: 11   clotrimazole (LOTRIMIN) 1 % cream, Apply 1 application topically 2 (two) times daily. , Disp: , Rfl: 1   [employer] CALCIUM 600 & VITAMIN D3 600-800 MG-UNIT TABS, TAKE 1 TABLET BY MOUTH TWICE A DAY, Disp: 180 tablet, Rfl: 2   diclofenac Sodium (VOLTAREN) 1 % GEL, Apply 1 application topically as needed., Disp: , Rfl:    Incontinence Supply Disposable (BLADDER CONTROL PADS EX ABSORB) MISC, Apply 1 Dose topically as needed. , Disp: , Rfl:    loratadine (CLARITIN) 10 MG tablet, Take 10 mg by mouth daily., Disp: , Rfl:    losartan (COZAAR) 25 MG tablet, Take 25 mg by mouth daily. , Disp: , Rfl:     methocarbamol (ROBAXIN) 500 MG tablet, Take 500 mg by mouth 2 (two) times daily as needed., Disp: , Rfl:    omeprazole (PRILOSEC) 20 MG capsule, Take 20 mg by mouth daily., Disp: , Rfl:    oxybutynin (DITROPAN) 5 MG tablet, Take 5 mg by mouth 2 (two) times daily., Disp: , Rfl:    potassium chloride SA (K-DUR) 20 MEQ tablet, Take 1 tablet (20 meq) by mouth once daily, Disp: , Rfl:    torsemide (DEMADEX) 20 MG tablet, TAKE 1 TABLET BY MOUTH TWICE A DAY, Disp: 180 tablet, Rfl: 1   triamcinolone cream (KENALOG) 0.1 %, Apply 1 application topically 2 (two) times daily. Back of legs, Disp: , Rfl:    isosorbide mononitrate (IMDUR) 60 MG 24 hr tablet, Take 1 tablet (60 mg total) by mouth 2 (two) times daily., Disp: 180 tablet, Rfl: 3  Physical exam:  Vitals:   02/22/21 1025 02/22/21 1029  BP:  (!) 146/77  Pulse:  (!) 54  Temp: (!) 97 F (36.1 C)   TempSrc: Tympanic    Physical Exam Constitutional:      General: She is not in acute distress.    Comments: Ambulates with a walker  Cardiovascular:     Rate and Rhythm: Normal rate and regular rhythm.     Heart sounds: Normal heart sounds.  Pulmonary:     Effort: Pulmonary effort is normal.     Breath sounds: Normal breath sounds.  Abdominal:     General: Bowel sounds are normal.     Palpations: Abdomen is soft.  Skin:    General: Skin is warm and dry.  Neurological:     Mental Status: She is alert and oriented to person, place, and time.   CMP Latest Ref Rng & Units 02/22/2021  Glucose 70 - 99 mg/dL 90  BUN 8 - 23 mg/dL 29(H)  Creatinine 0.44 - 1.00 mg/dL 1.20(H)  Sodium 135 - 145 mmol/L 139  Potassium  3.5 - 5.1 mmol/L 4.0  Chloride 98 - 111 mmol/L 101  CO2 22 - 32 mmol/L 29  Calcium 8.9 - 10.3 mg/dL 9.0  Total Protein 6.5 - 8.1 g/dL 7.7  Total Bilirubin 0.3 - 1.2 mg/dL 0.6  Alkaline Phos 38 - 126 U/L 55  AST 15 - 41 U/L 18  ALT 0 - 44 U/L 15   CBC Latest Ref Rng & Units 02/22/2021  WBC 4.0 - 10.5 K/uL 5.7  Hemoglobin 12.0 - 15.0  g/dL 11.2(L)  Hematocrit 36.0 - 46.0 % 35.2(L)  Platelets 150 - 400 K/uL 273     Assessment and plan- Patient is a 75 y.o. female with a history of invasive mammary, of the right breast in 2018 [id number] ER PR positive HER-2/neu negative status post lumpectomy and adjuvant radiation. This is a routine follow-up visit for breast cancer.   Clinically patient is doing well with no concerning signs and symptoms of recurrence based on today's exam. September 2021 mammogram was unremarkable and her bone density is normal as well. She will continue to take Arimidex along with calcium and vitamin D for 5 years.   She will rtc in 6 months to see Dr. Janese Banks with repeat mammogram in September. Repeat bone density in September 2023.   Right leg ulcer- followed by wound care weekly. Has Una boots that she wears inconsistently.    Visit Diagnosis 1. Malignant neoplasm of right female breast, unspecified estrogen receptor status, unspecified site of breast ([place])   2. Screening mammogram, encounter for    Greater than 50% was spent in counseling and coordination of care with this patient including but not limited to discussion of the relevant topics above (See A&P) including, but not limited to diagnosis and management of acute and chronic medical conditions.   Faythe Casa, NP 02/26/2021 2:06 PM

## 2021-02-25 ENCOUNTER — Other Ambulatory Visit: Payer: Self-pay

## 2021-02-25 ENCOUNTER — Encounter: Payer: Medicare HMO | Admitting: Physician Assistant

## 2021-02-25 DIAGNOSIS — L97828 Non-pressure chronic ulcer of other part of left lower leg with other specified severity: Secondary | ICD-10-CM | POA: Diagnosis not present

## 2021-02-25 NOTE — Progress Notes (Addendum)
BRIARROSE, SHOR ([id number]) Visit Report for 02/25/2021 Chief Complaint Document Details Patient Name: [name], [name] Date of Service: 02/25/2021 2:00 PM Medical Record Number: [id number] Patient Account Number: [id number] Date of Birth/Sex: 04/20/1946 (75 y.o. F) Treating RN: Donnamarie Poag Primary Care Provider: [name] Other Clinician: Referring Provider: [name] Treating Provider/Extender: Skipper Cliche in Treatment: 5 Information Obtained from: Patient Chief Complaint Left LE Ulcer Electronic Signature(s) Signed: 02/25/2021 2:28:26 PM By: Worthy Keeler PA-C Entered By: Worthy Keeler on 02/25/2021 14:28:26 [name] ([id number]) -------------------------------------------------------------------------------- HPI Details Patient Name: [name] Date of Service: 02/25/2021 2:00 PM Medical Record Number: [id number] Patient Account Number: [id number] Date of Birth/Sex: July 01, 1946 (75 y.o. F) Treating RN: Donnamarie Poag Primary Care Provider: [name] Other Clinician: Referring Provider: [name] Treating Provider/Extender: Skipper Cliche in Treatment: 5 History of Present Illness HPI Description: 03/15/2020 upon evaluation today patient presents today for initial evaluation here in our clinic concerning a wound that is on the left posterior lower extremity. Unfortunately this has been giving the patient some discomfort at this point which she notes has been affected her pretty much daily. The patient does have a history of venous insufficiency, hypertension, congestive heart failure, and a history of breast cancer. Fortunately there does not appear to be evidence of active infection at this time which is great news. No fevers, chills, nausea, vomiting, or diarrhea. Wound is not extremely large but does have some slough covering the surface of the wound. This is going require sharp debridement today. 03/15/2020 upon evaluation today patient appears  to be doing fairly well in regard to her wound. She does have some slough noted on the surface of the wound currently but I do believe the Iodoflex has been beneficial over the past week. There is no signs of active infection at this time which is great news and overall very pleased with the progress. No fevers, chills, nausea, vomiting, or diarrhea. 04/02/2020 upon evaluation today patient appears to be doing a little better in regard to her wound size wise this is not tremendously smaller she does have some slough buildup on the surface of the wound. With that being said this is going require some sharp debridement today to clear away some of the necrotic debris. Fortunately there is no evidence of active infection at this time. No fevers, chills, nausea, vomiting, or diarrhea. 04/10/2020 on evaluation today patient appears to be doing well with regard to her ulcer which is measuring somewhat smaller today. She actually has more granulation tissue noted which is also good news is no need for sharp debridement today. Fortunately there is no evidence of infection either which is also excellent. No fevers, chills, nausea, vomiting, or diarrhea. 04/26/2020 on evaluation today patient's wound actually is showing signs of good improvement which is great news. There does not appear to be any evidence of active infection. I do believe she is tolerating the collagen at this point. 05/03/2020 upon evaluation today patient's wound actually showed signs of good granulation at this time there does not appear to be any evidence of active infection which is great news and overall very pleased with where things stand. With that being said I do believe that the patient is can require some sharp debridement today but fortunately nothing too significant. 05/11/20 on evaluation today patient appears to be doing well in regard to her leg ulcer. She's been tolerating the dressing changes without complication. Fortunately there  is no signs of active infection at this time.  Overall I'm very pleased with where things stand. 05/18/2020 upon evaluation today patient's wound is showing signs of improvement is very dry and I think the collagen for the most part just dried to the wound bed. I am going to clean this away with sharp debridement in order to get this under control in my opinion. 05/25/2020 upon evaluation today patient actually appears to be doing well in regard to her leg ulcer. In fact upon inspection it appears she could potentially be completely healed but that is not guaranteed based on what we are seeing. There does not appear to be any signs of active infection which is great news. 05/31/2020 on evaluation today patient appears to be doing well with regard to her wounds. She in fact appears to be almost completely healed on the left leg there is just a very small opening remaining point 1 06/08/2020 upon evaluation today patient appears to be doing excellent in regard to her leg ulcer on the left in fact it appears to be that she is completely healed as of today. I see no signs of active infection at this time which is great news. No fevers, chills, nausea, vomiting, or diarrhea. READMISSION 01/16/2021 Patient that we have had in this clinic with a wound on the left posterior calf in the summer 2021 extending into October. She was felt to have chronic venous insufficiency. Per the patient's description she was discharged with what sounds like external compression stockings although she could not afford the "$75 per leg". She therefore did not get anything. Apparently her wound that she has currently is been in existence since January. She has been followed at vein and vascular with Unna boots and I think calcium alginate. She had ABIs done on 06/25/2020 that showed an noncompressible ABI on the right leg and the left leg but triphasic waveforms with good great toe pressures and a TBI of 0.90 on the right and 0.91  on the left Surprisingly the patient also had venous reflux studies that were really unremarkable. This included no evidence of DVTs or SVTs but there was no evidence of deep venous insufficiency or superficial venous insufficiency in the greater saphenous or short saphenous veins bilaterally. I would wonder about more central venous issues or perhaps this is all lymphedema 01/25/2021 upon evaluation today patient appears to be doing decently well and guard to her wound. The good news is the Iodoflex that is job she saw Dr. Dellia Nims last week for readmission and to be honest I think that this has done extremely well over that week. I do believe that she can tolerate the sharp debridement today which will be good. I think that cleaning off the wound will likely allow [place] to be able to use a different type of dressing I do not think the Iodoflex will even be necessary based on how clean the wound looks. Fortunately there is no sign of active infection at this time which is great news. No fevers, chills, nausea, vomiting, or diarrhea. 02/12/2021 upon evaluation today patient appears to be doing well with regard to her leg ulcer. We did switch to alginate when she came for nurse visit I think is doing much better for her. Fortunately there does not appear to be any signs of active infection at this time which is great news. No fevers, chills, nausea, vomiting, or diarrhea. [name], [name] ([id number]) 02/18/2021 upon evaluation today patient's wound is actually showing signs of good improvement. I am very pleased with where things stand  currently. I do not see any signs of active infection which is great news and overall I feel like she is making good progress. The patient likewise is happy that things are doing so well and that this is measuring somewhat smaller. 02/25/2021 upon evaluation today patient actually appears to be doing quite well in regard to her wounds. She has been tolerating the  dressing changes without complication. Fortunately there does not appear to be any signs of active infection which is great news and overall I am extremely pleased with where things stand. No fevers, chills, nausea, vomiting, or diarrhea. She does have a lot of edema I really do think she needs compression socks to be worn in order to prevent things from causing additional issues for her currently. Especially on the right leg she should be wearing compression socks all the time to be honest. Electronic Signature(s) Signed: 02/25/2021 2:49:50 PM By: Worthy Keeler PA-C Previous Signature: 02/25/2021 2:49:22 PM Version By: Worthy Keeler PA-C Entered By: Worthy Keeler on 02/25/2021 14:49:49 [name], [name] ([id number]) -------------------------------------------------------------------------------- Physical Exam Details Patient Name: [name] Date of Service: 02/25/2021 2:00 PM Medical Record Number: [id number] Patient Account Number: [id number] Date of Birth/Sex: Nov 28, 1945 (75 y.o. F) Treating RN: Donnamarie Poag Primary Care Provider: [name] Other Clinician: Referring Provider: [name] Treating Provider/Extender: Skipper Cliche in Treatment: 5 Constitutional Obese and well-hydrated in no acute distress. Respiratory normal breathing without difficulty. Psychiatric this patient is able to make decisions and demonstrates good insight into disease process. Alert and Oriented x 3. pleasant and cooperative. Notes With regard to the patient's wounds on her left leg I do feel like she has a small opening but fortunately nothing to significant here this is great news. With regard to her right leg this is just swollen there is no open wound although she did want me to have a look at this currently. Nonetheless I think that the biggest issue here simply is just that she needs compression socks in order to help keep the edema under control. I discussed this with her and  hopefully she will be able to get some compression socks to get things started in that regard. I gave her the information for elastic therapy Electronic Signature(s) Signed: 02/25/2021 2:54:06 PM By: Worthy Keeler PA-C Entered By: Worthy Keeler on 02/25/2021 14:54:06 [name] ([id number]) -------------------------------------------------------------------------------- Physician Orders Details Patient Name: [name] Date of Service: 02/25/2021 2:00 PM Medical Record Number: [id number] Patient Account Number: [id number] Date of Birth/Sex: [birth year] (75 y.o. F) Treating RN: Donnamarie Poag Primary Care Provider: [name] Other Clinician: Referring Provider: [name] Treating Provider/Extender: Skipper Cliche in Treatment: 5 Verbal / Phone Orders: No Diagnosis Coding ICD-10 Coding Code Description I89.0 Lymphedema, not elsewhere classified I87.332 Chronic venous hypertension (idiopathic) with ulcer and inflammation of left lower extremity L97.828 Non-pressure chronic ulcer of other part of left lower leg with other specified severity Follow-up Appointments o Return Appointment in 1 week. Bathing/ Shower/ Hygiene o May shower with wound dressing protected with water repellent cover or cast protector. Edema Control - Lymphedema / Segmental Compressive Device / Other Left Lower Extremity o Optional: One layer of unna paste to top of compression wrap (to act as an anchor). o Tubigrip single layer applied. - RIGHT LEG tubi grip applied "E" until you get your compression sock use this and it's hand washable o Patient to wear own compression stockings. Remove compression stockings every night before going to bed and put on  every morning when getting up. - on RIGHT LEG call Elastic Therapy for compression sock 15-22 mm/Hg, measurements and phone number on blue sheet o Elevate, Exercise Daily and Avoid Standing for Long Periods of Time. o Elevate legs to  the level of the heart and pump ankles as often as possible o Elevate leg(s) parallel to the floor when sitting. o Compression Pump: Use compression pump on left lower extremity for 60 minutes, twice daily. - You may pump over wraps o Compression Pump: Use compression pump on right lower extremity for 60 minutes, twice daily. - You may pump over wraps Additional Orders / Instructions Wound #2 Left,Lateral Lower Leg o Follow Nutritious Diet and Increase Protein Intake - Increase protein intake. Wound Treatment Wound #2 - Lower Leg Wound Laterality: Left, Lateral Cleanser: Soap and Water 1 x Per Week/30 Days Discharge Instructions: Gently cleanse wound with antibacterial soap, rinse and pat dry prior to dressing wounds Topical: Triamcinolone Acetonide Cream, 0.1%, 15 (g) tube 1 x Per Week/30 Days Discharge Instructions: Apply as directed by provider. Primary Dressing: Silvercel Small 2x2 (in/in) 1 x Per Week/30 Days Discharge Instructions: Apply Silvercel Small 2x2 (in/in) as instructed Secondary Dressing: Xtrasorb Medium 4x5 (in/in) 1 x Per Week/30 Days Discharge Instructions: Apply to wound as directed. Do not cut. Compression Wrap: Profore Lite LF 3 Multilayer Compression Bandaging System 1 x Per Week/30 Days Discharge Instructions: Apply 3 multi-layer wrap as prescribed. Electronic Signature(s) Signed: 02/25/2021 3:06:19 PM By: Donnamarie Poag Signed: 02/25/2021 6:32:22 PM By: Worthy Keeler PA-C Entered By: Donnamarie Poag on 02/25/2021 14:44:44 [name] ([id number]) -------------------------------------------------------------------------------- Problem List Details Patient Name: [name] Date of Service: 02/25/2021 2:00 PM Medical Record Number: [id number] Patient Account Number: [id number] Date of Birth/Sex: 10-07-1945 (75 y.o. F) Treating RN: Donnamarie Poag Primary Care Provider: [name] Other Clinician: Referring Provider: [name] Treating  Provider/Extender: Skipper Cliche in Treatment: 5 Active Problems ICD-10 Encounter Code Description Active Date MDM Diagnosis I89.0 Lymphedema, not elsewhere classified 01/16/2021 No Yes I87.332 Chronic venous hypertension (idiopathic) with ulcer and inflammation of 01/16/2021 No Yes left lower extremity L97.828 Non-pressure chronic ulcer of other part of left lower leg with other 01/16/2021 No Yes specified severity Inactive Problems Resolved Problems Electronic Signature(s) Signed: 02/25/2021 2:28:20 PM By: Worthy Keeler PA-C Entered By: Worthy Keeler on 02/25/2021 14:28:20 [name] ([id number]) -------------------------------------------------------------------------------- Progress Note Details Patient Name: [name] Date of Service: 02/25/2021 2:00 PM Medical Record Number: [id number] Patient Account Number: [id number] Date of Birth/Sex: Oct 10, 1945 (75 y.o. F) Treating RN: Donnamarie Poag Primary Care Provider: [name] Other Clinician: Referring Provider: [name] Treating Provider/Extender: Skipper Cliche in Treatment: 5 Subjective Chief Complaint Information obtained from Patient Left LE Ulcer History of Present Illness (HPI) 03/15/2020 upon evaluation today patient presents today for initial evaluation here in our clinic concerning a wound that is on the left posterior lower extremity. Unfortunately this has been giving the patient some discomfort at this point which she notes has been affected her pretty much daily. The patient does have a history of venous insufficiency, hypertension, congestive heart failure, and a history of breast cancer. Fortunately there does not appear to be evidence of active infection at this time which is great news. No fevers, chills, nausea, vomiting, or diarrhea. Wound is not extremely large but does have some slough covering the surface of the wound. This is going require sharp debridement today. 03/15/2020 upon  evaluation today patient appears to be doing fairly well in regard to  her wound. She does have some slough noted on the surface of the wound currently but I do believe the Iodoflex has been beneficial over the past week. There is no signs of active infection at this time which is great news and overall very pleased with the progress. No fevers, chills, nausea, vomiting, or diarrhea. 04/02/2020 upon evaluation today patient appears to be doing a little better in regard to her wound size wise this is not tremendously smaller she does have some slough buildup on the surface of the wound. With that being said this is going require some sharp debridement today to clear away some of the necrotic debris. Fortunately there is no evidence of active infection at this time. No fevers, chills, nausea, vomiting, or diarrhea. 04/10/2020 on evaluation today patient appears to be doing well with regard to her ulcer which is measuring somewhat smaller today. She actually has more granulation tissue noted which is also good news is no need for sharp debridement today. Fortunately there is no evidence of infection either which is also excellent. No fevers, chills, nausea, vomiting, or diarrhea. 04/26/2020 on evaluation today patient's wound actually is showing signs of good improvement which is great news. There does not appear to be any evidence of active infection. I do believe she is tolerating the collagen at this point. 05/03/2020 upon evaluation today patient's wound actually showed signs of good granulation at this time there does not appear to be any evidence of active infection which is great news and overall very pleased with where things stand. With that being said I do believe that the patient is can require some sharp debridement today but fortunately nothing too significant. 05/11/20 on evaluation today patient appears to be doing well in regard to her leg ulcer. She's been tolerating the dressing changes  without complication. Fortunately there is no signs of active infection at this time. Overall I'm very pleased with where things stand. 05/18/2020 upon evaluation today patient's wound is showing signs of improvement is very dry and I think the collagen for the most part just dried to the wound bed. I am going to clean this away with sharp debridement in order to get this under control in my opinion. 05/25/2020 upon evaluation today patient actually appears to be doing well in regard to her leg ulcer. In fact upon inspection it appears she could potentially be completely healed but that is not guaranteed based on what we are seeing. There does not appear to be any signs of active infection which is great news. 05/31/2020 on evaluation today patient appears to be doing well with regard to her wounds. She in fact appears to be almost completely healed on the left leg there is just a very small opening remaining point 1 06/08/2020 upon evaluation today patient appears to be doing excellent in regard to her leg ulcer on the left in fact it appears to be that she is completely healed as of today. I see no signs of active infection at this time which is great news. No fevers, chills, nausea, vomiting, or diarrhea. READMISSION 01/16/2021 Patient that we have had in this clinic with a wound on the left posterior calf in the summer 2021 extending into October. She was felt to have chronic venous insufficiency. Per the patient's description she was discharged with what sounds like external compression stockings although she could not afford the "$75 per leg". She therefore did not get anything. Apparently her wound that she has currently is been in  existence since January. She has been followed at vein and vascular with Unna boots and I think calcium alginate. She had ABIs done on 06/25/2020 that showed an noncompressible ABI on the right leg and the left leg but triphasic waveforms with good great toe pressures  and a TBI of 0.90 on the right and 0.91 on the left Surprisingly the patient also had venous reflux studies that were really unremarkable. This included no evidence of DVTs or SVTs but there was no evidence of deep venous insufficiency or superficial venous insufficiency in the greater saphenous or short saphenous veins bilaterally. I would wonder about more central venous issues or perhaps this is all lymphedema 01/25/2021 upon evaluation today patient appears to be doing decently well and guard to her wound. The good news is the Iodoflex that is job she saw Dr. Dellia Nims last week for readmission and to be honest I think that this has done extremely well over that week. I do believe that she can tolerate the sharp debridement today which will be good. I think that cleaning off the wound will likely allow [place] to be able to use a different type of dressing I do not think the Iodoflex will even be necessary based on how clean the wound looks. Fortunately there is no sign of active infection at this time which is great news. No fevers, chills, nausea, vomiting, or diarrhea. [name], [name] ([id number]) 02/12/2021 upon evaluation today patient appears to be doing well with regard to her leg ulcer. We did switch to alginate when she came for nurse visit I think is doing much better for her. Fortunately there does not appear to be any signs of active infection at this time which is great news. No fevers, chills, nausea, vomiting, or diarrhea. 02/18/2021 upon evaluation today patient's wound is actually showing signs of good improvement. I am very pleased with where things stand currently. I do not see any signs of active infection which is great news and overall I feel like she is making good progress. The patient likewise is happy that things are doing so well and that this is measuring somewhat smaller. 02/25/2021 upon evaluation today patient actually appears to be doing quite well in regard to her wounds.  She has been tolerating the dressing changes without complication. Fortunately there does not appear to be any signs of active infection which is great news and overall I am extremely pleased with where things stand. No fevers, chills, nausea, vomiting, or diarrhea. She does have a lot of edema I really do think she needs compression socks to be worn in order to prevent things from causing additional issues for her currently. Especially on the right leg she should be wearing compression socks all the time to be honest. Objective Constitutional Obese and well-hydrated in no acute distress. Vitals Time Taken: 2:23 PM, Height: 69 in, Weight: 244 lbs, BMI: 36, Temperature: 98.2 F, Pulse: 65 bpm, Respiratory Rate: 18 breaths/min, Blood Pressure: 185/84 mmHg. Respiratory normal breathing without difficulty. Psychiatric this patient is able to make decisions and demonstrates good insight into disease process. Alert and Oriented x 3. pleasant and cooperative. General Notes: With regard to the patient's wounds on her left leg I do feel like she has a small opening but fortunately nothing to significant here this is great news. With regard to her right leg this is just swollen there is no open wound although she did want me to have a look at this currently. Nonetheless I think that the  biggest issue here simply is just that she needs compression socks in order to help keep the edema under control. I discussed this with her and hopefully she will be able to get some compression socks to get things started in that regard. I gave her the information for elastic therapy Integumentary (Hair, Skin) Wound #2 status is Open. Original cause of wound was Gradually Appeared. The date acquired was: 09/08/2020. The wound has been in treatment 5 weeks. The wound is located on the Left,Lateral Lower Leg. The wound measures 2.7cm length x 3.7cm width x 0.2cm depth; 7.846cm^2 area and 1.569cm^3 volume. There is Fat Layer  (Subcutaneous Tissue) exposed. There is no tunneling or undermining noted. There is a medium amount of serosanguineous drainage noted. There is medium (34-66%) red granulation within the wound bed. There is a medium (34-66%) amount of necrotic tissue within the wound bed including Adherent Slough. 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 There was a Three Layer Compression Therapy Procedure by Donnamarie Poag, RN. Post procedure Diagnosis Wound #2: Same as Pre-Procedure [name], [name] ([id number]) Plan Follow-up Appointments: Return Appointment in 1 week. Bathing/ Shower/ Hygiene: May shower with wound dressing protected with water repellent cover or cast protector. Edema Control - Lymphedema / Segmental Compressive Device / Other: Optional: One layer of unna paste to top of compression wrap (to act as an anchor). Tubigrip single layer applied. - RIGHT LEG tubi grip applied "E" until you get your compression sock use this and it's hand washable Patient to wear own compression stockings. Remove compression stockings every night before going to bed and put on every morning when getting up. - on RIGHT LEG call El[facility] Therapy for compression sock 15-22 mm/Hg, measurements and phone number on blue sheet Elevate, Exercise Daily and Avoid Standing for Long Periods of Time. Elevate legs to the level of the heart and pump ankles as often as possible Elevate leg(s) parallel to the floor when sitting. Compression Pump: Use compression pump on left lower extremity for 60 minutes, twice daily. - You may pump over wraps Compression Pump: Use compression pump on right lower  extremity for 60 minutes, twice daily. - You may pump over wraps Additional Orders / Instructions: Wound #2 Left,Lateral Lower Leg: Follow Nutritious Diet and Increase Protein Intake - Increase protein intake. WOUND #2: - Lower Leg Wound Laterality: Left, Lateral Cleanser: Soap and Water 1 x Per Week/30 Days Discharge Instructions: Gently cleanse wound with antibacterial soap, rinse and pat dry prior to dressing wounds Topical: Triamcinolone Acetonide Cream, 0.1%, 15 (g) tube 1 x Per Week/30 Days Discharge Instructions: Apply as directed by provider. Primary Dressing: Silvercel Small 2x2 (in/in) 1 x Per Week/30 Days Discharge Instructions: Apply Silvercel Small 2x2 (in/in) as instructed Secondary Dressing: Xtrasorb Medium 4x5 (in/in) 1 x Per Week/30 Days Discharge Instructions: Apply to wound as directed. Do not cut. Compression Wrap: Profore Lite LF 3 Multilayer Compression Bandaging System 1 x Per Week/30 Days Discharge Instructions: Apply 3 multi-layer wrap as prescribed. 1. Would recommend at this time that we have the patient go ahead and continue with the compression wrap on the left leg I think this is still the best way to go. 2. I am also can recommend that we  have the patient continue to monitor for any signs of worsening infection. This includes the use of the silver cell and XtraSorb to try to help with controlling the drainage. I also believe that the 3 layer compression wrap is doing a good job in this regard. 3. I am can go ahead and have the patient proceed with getting some compression wraps from elastic therapy I think this would be a good option for her. We will see patient back for reevaluation in 1 week here in the clinic. If anything worsens or changes patient will contact our office for additional recommendations. Electronic Signature(s) Signed: 02/25/2021 2:58:16 PM By: Worthy Keeler PA-C Entered By: Worthy Keeler on 02/25/2021 14:58:16 [name]  ([id number]) -------------------------------------------------------------------------------- SuperBill Details Patient Name: [name] Date of Service: 02/25/2021 Medical Record Number: [id number] Patient Account Number: [id number] Date of Birth/Sex: [birth year] (75 y.o. 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 Facility Procedures CPT4 Code: [id number] Description: (Facility Use Only) [phone] - [place] [facility]G Modifier: Quantity: 1 Physician Procedures CPT4 Code Description: [id number] - WC PHYS LEVEL 3 - EST PT Modifier: Quantity: 1 CPT4 Code Description: ICD-10 Diagnosis Description I89.0 Lymphedema, not elsewhere classified I87.332 Chronic venous hypertension (idiopathic) with ulcer and inflammation of le L97.828 Non-pressure chronic ulcer of other part of left lower leg with  other spec Modifier: ft lower extremity ified severity Quantity: Electronic Signature(s) Signed: 02/25/2021 2:58:32 PM By: Worthy Keeler PA-C Entered By: Worthy Keeler on 02/25/2021 14:58:32

## 2021-02-25 NOTE — Progress Notes (Signed)
JILDA, KRESS ([id number]) Visit Report for 02/25/2021 Arrival Information Details Patient Name: [name], [name] Date of Service: 02/25/2021 2:00 PM Medical Record Number: [id number] Patient Account Number: [id number] Date of Birth/Sex: August 26, 1946 (75 y.o. F) Treating RN: Dolan Amen Primary Care Stasha Naraine: [name] Other Clinician: Referring Damika Harmon: [name] Treating Yulissa Needham/Extender: Skipper Cliche in Treatment: 5 Visit Information History Since Last Visit Has Compression in Place as Prescribed: Yes Patient Arrived: Walker Pain Present Now: Yes Arrival Time: 14:22 Accompanied By: self Transfer Assistance: None Patient Identification Verified: Yes Secondary Verification Process Completed: Yes Patient Requires Transmission-Based Precautions: No Patient Has Alerts: Yes Patient Alerts: NOT DIABETIC Electronic Signature(s) Signed: 02/25/2021 4:29:35 PM By: Georges Mouse, Minus Breeding RN Entered By: Georges Mouse, Minus Breeding on 02/25/2021 14:23:04 [name] ([id number]) -------------------------------------------------------------------------------- Clinic Level of Care Assessment Details Patient Name: [name] Date of Service: 02/25/2021 2:00 PM Medical Record Number: [id number] Patient Account Number: [id number] Date of Birth/Sex: 04-21-1946 (75 y.o. F) Treating RN: Donnamarie Poag Primary Care Janat Tabbert: [name] Other Clinician: Referring Tyesha Joffe: [name] Treating Roselinda Bahena/Extender: Skipper Cliche in Treatment: 5 Clinic Level of Care Assessment Items TOOL 1 Quantity Score []  - Use when EandM and Procedure is performed on INITIAL visit 0 ASSESSMENTS - Nursing Assessment / Reassessment []  - General Physical Exam (combine w/ comprehensive assessment (listed just below) when performed on new 0 pt. evals) []  - 0 Comprehensive Assessment (HX, ROS, Risk Assessments, Wounds Hx, etc.) ASSESSMENTS - Wound and Skin Assessment / Reassessment []  -  Dermatologic / Skin Assessment (not related to wound area) 0 ASSESSMENTS - Ostomy and/or Continence Assessment and Care []  - Incontinence Assessment and Management 0 []  - 0 Ostomy Care Assessment and Management (repouching, etc.) PROCESS - Coordination of Care []  - Simple Patient / Family Education for ongoing care 0 []  - 0 Complex (extensive) Patient / Family Education for ongoing care []  - 0 Staff obtains Programmer, systems, Records, Test Results / Process Orders []  - 0 Staff telephones HHA, Nursing Homes / Clarify orders / etc []  - 0 Routine Transfer to another Facility (non-emergent condition) []  - 0 Routine Hospital Admission (non-emergent condition) []  - 0 New Admissions / Biomedical engineer / Ordering NPWT, Apligraf, etc. []  - 0 Emergency Hospital Admission (emergent condition) PROCESS - Special Needs []  - Pediatric / Minor Patient Management 0 []  - 0 Isolation Patient Management []  - 0 Hearing / Language / Visual special needs []  - 0 Assessment of Community assistance (transportation, D/C planning, etc.) []  - 0 Additional assistance / Altered mentation []  - 0 Support Surface(s) Assessment (bed, cushion, seat, etc.) INTERVENTIONS - Miscellaneous []  - External ear exam 0 []  - 0 Patient Transfer (multiple staff / Civil Service fast streamer / Similar devices) []  - 0 Simple Staple / Suture removal (25 or less) []  - 0 Complex Staple / Suture removal (26 or more) []  - 0 Hypo/Hyperglycemic Management (do not check if billed separately) []  - 0 Ankle / Brachial Index (ABI) - do not check if billed separately Has the patient been seen at the hospital within the last three years: Yes Total Score: 0 Level Of Care: ____ [name] ([id number]) Electronic Signature(s) Signed: 02/25/2021 3:06:19 PM By: Donnamarie Poag Entered By: Donnamarie Poag on 02/25/2021 14:44:51 [name] ([id number]) -------------------------------------------------------------------------------- Compression  Therapy Details Patient Name: [name] Date of Service: 02/25/2021 2:00 PM Medical Record Number: [id number] Patient Account Number: [id number] Date of Birth/Sex: 12-24-1945 (75 y.o. F) Treating RN: Donnamarie Poag Primary Care Tranesha Lessner: [name] Other Clinician: Referring Guenevere Roorda: [name] Treating  Jamason Peckham/Extender: [name] Weeks in Treatment: 5 Compression Therapy Performed for Wound Assessment: Wound #2 Left,Lateral Lower Leg Performed By: Clinician Donnamarie Poag, RN Compression Type: Three Layer Post Procedure Diagnosis Same as Pre-procedure Electronic Signature(s) Signed: 02/25/2021 3:06:19 PM By: Donnamarie Poag Entered By: Donnamarie Poag on 02/25/2021 14:42:41 [name] ([id number]) -------------------------------------------------------------------------------- Encounter Discharge Information Details Patient Name: [name] Date of Service: 02/25/2021 2:00 PM Medical Record Number: [id number] Patient Account Number: [id number] Date of Birth/Sex: Aug 25, 1946 (75 y.o. F) Treating RN: Donnamarie Poag Primary Care Shawnette Augello: [name] Other Clinician: Referring Jayanna Kroeger: [name] Treating Finbar Nippert/Extender: Skipper Cliche in Treatment: 5 Encounter Discharge Information Items Discharge Condition: Stable Ambulatory Status: Walker Discharge Destination: Home Transportation: Other Accompanied By: self Schedule Follow-up Appointment: Yes Clinical Summary of Care: Electronic Signature(s) Signed: 02/25/2021 3:06:19 PM By: Donnamarie Poag Entered By: Donnamarie Poag on 02/25/2021 14:58:53 [name] ([id number]) -------------------------------------------------------------------------------- Lower Extremity Assessment Details Patient Name: [name] Date of Service: 02/25/2021 2:00 PM Medical Record Number: [id number] Patient Account Number: [id number] Date of Birth/Sex: April 04, 1946 (75 y.o. F) Treating RN: Dolan Amen Primary Care Dhillon Comun[place]:  [name] Other Clinician: Referring Kailana Benninger: [name] Treating Shivank Pinedo/Extender: Skipper Cliche in Treatment: 5 Edema Assessment Assessed: [Left: Yes] [Right: Yes] Edema: [Left: Yes] [Right: Yes] Calf Left: Right: Point of Measurement: 27 cm From Medial Instep 35.5 cm 44.5 cm Ankle Left: Right: Point of Measurement: 10 cm From Medial Instep 26.2 cm 28.5 cm Knee To Floor Left: Right: From Medial Instep 38 cm Vascular Assessment Pulses: Dorsalis Pedis Palpable: [Left:Yes] Electronic Signature(s) Signed: 02/25/2021 3:06:19 PM By: Donnamarie Poag Signed: 02/25/2021 4:29:35 PM By: Georges Mouse, Minus Breeding RN Entered By: Donnamarie Poag on 02/25/2021 14:46:29 [name] ([id number]) -------------------------------------------------------------------------------- Multi Wound Chart Details Patient Name: [name] Date of Service: 02/25/2021 2:00 PM Medical Record Number: [id number] Patient Account Number: [id number] Date of Birth/Sex: 09-19-1945 (75 y.o. F) Treating RN: Donnamarie Poag Primary Care Tenzin Edelman: [name] Other Clinician: Referring Donasia Wimes: [name] Treating Khamani Fairley/Extender: Skipper Cliche in Treatment: 5 Vital Signs Height(in): 69 Pulse(bpm): 33 Weight(lbs): 244 Blood Pressure(mmHg): 185/84 Body Mass Index(BMI): 36 Temperature(F): 98.2 Respiratory Rate(breaths/min): 18 Photos: [N/A:N/A] Wound Location: Left, Lateral Lower Leg N/A N/A Wounding Event: Gradually Appeared N/A N/A Primary Etiology: Venous Leg Ulcer N/A N/A Comorbid History: Cataracts, Lymphedema, Sleep N/A N/A Apnea, Congestive Heart Failure, Hypertension, Osteoarthritis, Neuropathy, Received Chemotherapy, Received Radiation Date Acquired: 09/08/2020 N/A N/A Weeks of Treatment: 5 N/A N/A Wound Status: Open N/A N/A Measurements L x W x D (cm) 2.7x3.7x0.2 N/A N/A Area (cm) : 7.846 N/A N/A Volume (cm) : 1.569 N/A N/A % Reduction in Area: -66.50% N/A N/A % Reduction in  Volume: -233.10% N/A N/A Classification: Full Thickness Without Exposed N/A N/A Support Structures Exudate Amount: Medium N/A N/A Exudate Type: Serosanguineous N/A N/A Exudate Color: red, brown N/A N/A Granulation Amount: Medium (34-66%) N/A N/A Granulation Quality: Red N/A N/A Necrotic Amount: Medium (34-66%) N/A N/A Exposed Structures: Fat Layer (Subcutaneous Tissue): N/A N/A Yes Fascia: No Tendon: No Muscle: No Joint: No Bone: No Epithelialization: Medium (34-66%) N/A N/A Treatment Notes Electronic Signature(s) Signed: 02/25/2021 3:06:19 PM By: Donnamarie Poag Entered By: Donnamarie Poag on 02/25/2021 14:41:48 [name] ([id number]) -------------------------------------------------------------------------------- [place] Details Patient Name: [name] Date of Service: 02/25/2021 2:00 PM Medical Record Number: [id number] Patient Account Number: [id number] Date of Birth/Sex: 07-22-1946 (75 y.o. F) Treating RN: Donnamarie Poag Primary Care Taevion Sikora: [name] Other Clinician: Referring Daquann Merriott: [name] Treating Luka Stohr/Extender: Skipper Cliche in Treatment: 5 Active  Inactive Wound/Skin Impairment Nursing Diagnoses: Impaired tissue integrity Goals: Patient/caregiver will verbalize understanding of skin care regimen Date Initiated: 01/16/2021 Date Inactivated: 02/18/2021 Target Resolution Date: 02/16/2021 Goal Status: Met Ulcer/skin breakdown will have a volume reduction of 30% by week 4 Date Initiated: 01/16/2021 Date Inactivated: 02/18/2021 Target Resolution Date: 02/16/2021 Goal Status: Met Ulcer/skin breakdown will have a volume reduction of 50% by week 8 Date Initiated: 01/16/2021 Target Resolution Date: 03/18/2021 Goal Status: Active Interventions: Assess ulceration(s) every visit Treatment Activities: Topical wound management initiated : 01/16/2021 Notes: Electronic Signature(s) Signed: 02/25/2021 3:06:19 PM By: Donnamarie Poag Entered By: Donnamarie Poag on 02/25/2021 14:40:54 [name] ([id number]) -------------------------------------------------------------------------------- Pain Assessment Details Patient Name: [name] Date of Service: 02/25/2021 2:00 PM Medical Record Number: [id number] Patient Account Number: [id number] Date of Birth/Sex: 18-Feb-1946 (75 y.o. F) Treating RN: Dolan Amen Primary Care Naketa Daddario: [name] Other Clinician: Referring Cypher Paule: [name] Treating Therman Hughlett/Extender: Skipper Cliche in Treatment: 5 Active Problems Location of Pain Severity and Description of Pain Patient Has Paino No Site Locations Rate the pain. Current Pain Level: 0 Pain Management and Medication Current Pain Management: Electronic Signature(s) Signed: 02/25/2021 4:29:35 PM By: Georges Mouse, Minus Breeding RN Entered By: Georges Mouse, Minus Breeding on 02/25/2021 14:25:44 [name] ([id number]) -------------------------------------------------------------------------------- Patient/Caregiver Education Details Patient Name: [name] Date of Service: 02/25/2021 2:00 PM Medical Record Number: [id number] Patient Account Number: [id number] Date of Birth/Gender: July 07, 1946 (75 y.o. F) Treating RN: Donnamarie Poag Primary Care Physician: [name] Other Clinician: Referring Physician: [name] Treating Physician/Extender: Skipper Cliche in Treatment: 5 Education Assessment Education Provided To: Patient Education Topics Provided Basic Hygiene: Venous: Methods: Explain/Verbal Responses: State content correctly Wound/Skin Impairment: Electronic Signature(s) Signed: 02/25/2021 3:06:19 PM By: Donnamarie Poag Entered By: Donnamarie Poag on 02/25/2021 14:45:16 [name] ([id number]) -------------------------------------------------------------------------------- Wound Assessment Details Patient Name: [name] Date of Service: 02/25/2021 2:00 PM Medical Record  Number: [id number] Patient Account Number: [id number] Date of Birth/Sex: 1946/04/05 (75 y.o. F) Treating RN: Dolan Amen Primary Care Darly Massi: [name] Other Clinician: Referring Lilyth Lawyer: [name] Treating Al Gagen/Extender: Skipper Cliche in Treatment: 5 Wound Status Wound Number: 2 Primary Venous Leg Ulcer Etiology: Wound Location: Left, Lateral Lower Leg Wound Open Wounding Event: Gradually Appeared Status: Date Acquired: 09/08/2020 Comorbid Cataracts, Lymphedema, Sleep Apnea, Congestive Heart Weeks Of Treatment: 5 History: Failure, Hypertension, Osteoarthritis, Neuropathy, Clustered Wound: No Received Chemotherapy, Received Radiation Photos Wound Measurements Length: (cm) 2.7 Width: (cm) 3.7 Depth: (cm) 0.2 Area: (cm) 7.846 Volume: (cm) 1.569 % Reduction in Area: -66.5% % Reduction in Volume: -233.1% Epithelialization: Medium (34-66%) Tunneling: No Undermining: No Wound Description Classification: Full Thickness Without Exposed Support Structu Exudate Amount: Medium Exudate Type: Serosanguineous Exudate Color: red, brown res Foul Odor After Cleansing: No Slough/Fibrino Yes Wound Bed Granulation Amount: Medium (34-66%) Exposed Structure Granulation Quality: Red Fascia Exposed: No Necrotic Amount: Medium (34-66%) Fat Layer (Subcutaneous Tissue) Exposed: Yes Necrotic Quality: Adherent Slough Tendon Exposed: No Muscle Exposed: No Joint Exposed: No Bone Exposed: No Treatment Notes Wound #2 (Lower Leg) Wound Laterality: Left, Lateral Cleanser Soap and Water Discharge Instruction: Gently cleanse wound with antibacterial soap, rinse and pat dry prior to dressing wounds Peri-Wound Care [place], Juliyah ([id number]) Topical Triamcinolone Acetonide Cream, 0.1%, 15 (g) tube Discharge Instruction: Apply as directed by Perel Hauschild. Primary Dressing Silvercel Small 2x2 (in/in) Discharge Instruction: Apply Silvercel Small 2x2 (in/in) as  instructed Secondary Dressing Xtrasorb Medium 4x5 (in/in) Discharge Instruction: Apply to wound as directed. Do not cut. Secured With Compression Wrap Profore Lite LF 3  Multilayer Compression Bandaging System Discharge Instruction: Apply 3 multi-layer wrap as prescribed. Compression Stockings Add-Ons Electronic Signature(s) Signed: 02/25/2021 4:29:35 PM By: Georges Mouse, Minus Breeding RN Entered By: Georges Mouse, Minus Breeding on 02/25/2021 14:31:42 [name] ([id number]) -------------------------------------------------------------------------------- [place] Details Patient Name: [name] Date of Service: 02/25/2021 2:00 PM Medical Record Number: [id number] Patient Account Number: [id number] Date of Birth/Sex: [birth year] (75 y.o. F) Treating RN: Dolan Amen Primary Care Hicks Feick: [name] Other Clinician: Referring Carri Spillers: [name] Treating Shakeya Kerkman/Extender: Skipper Cliche in Treatment: 5 Vital Signs Time Taken: 14:23 Temperature (F): 98.2 Height (in): 69 Pulse (bpm): 65 Weight (lbs): 244 Respiratory Rate (breaths/min): 18 Body Mass Index (BMI): 36 Blood Pressure (mmHg): 185/84 Reference Range: 80 - 120 mg / dl Electronic Signature(s) Signed: 02/25/2021 4:29:35 PM By: Georges Mouse, Minus Breeding RN Entered By: Georges Mouse, Minus Breeding on 02/25/2021 14:25:32

## 2021-03-04 ENCOUNTER — Encounter: Payer: Medicare HMO | Admitting: Physician Assistant

## 2021-03-04 ENCOUNTER — Other Ambulatory Visit: Payer: Self-pay

## 2021-03-04 DIAGNOSIS — L97828 Non-pressure chronic ulcer of other part of left lower leg with other specified severity: Secondary | ICD-10-CM | POA: Diagnosis not present

## 2021-03-04 NOTE — Progress Notes (Addendum)
[name], [name] ([id number]) Visit Report for 03/04/2021 Chief Complaint Document Details Patient Name: [name], [name] Date of Service: 03/04/2021 2:00 PM Medical Record Number: [id number] Patient Account Number: [id number] Date of Birth/Sex: 03/22/1946 (75 y.o. F) Treating RN: Primary Care Provider: [name] Other Clinician: Referring Provider: [name] Treating Provider/Extender: Skipper Cliche in Treatment: 6 Information Obtained from: Patient Chief Complaint Left LE Ulcer Electronic Signature(s) Signed: 03/04/2021 2:25:13 PM By: Worthy Keeler PA-C Entered By: Worthy Keeler on 03/04/2021 14:25:13 [name] ([id number]) -------------------------------------------------------------------------------- HPI Details Patient Name: [name] Date of Service: 03/04/2021 2:00 PM Medical Record Number: [id number] Patient Account Number: [id number] Date of Birth/Sex: Mar 17, 1946 (75 y.o. F) Treating RN: Primary Care Provider: [name] Other Clinician: Referring Provider: [name] Treating Provider/Extender: Skipper Cliche in Treatment: 6 History of Present Illness HPI Description: 03/15/2020 upon evaluation today patient presents today for initial evaluation here in our clinic concerning a wound that is on the left posterior lower extremity. Unfortunately this has been giving the patient some discomfort at this point which she notes has been affected her pretty much daily. The patient does have a history of venous insufficiency, hypertension, congestive heart failure, and a history of breast cancer. Fortunately there does not appear to be evidence of active infection at this time which is great news. No fevers, chills, nausea, vomiting, or diarrhea. Wound is not extremely large but does have some slough covering the surface of the wound. This is going require sharp debridement today. 03/15/2020 upon evaluation today patient appears to be doing fairly well in  regard to her wound. She does have some slough noted on the surface of the wound currently but I do believe the Iodoflex has been beneficial over the past week. There is no signs of active infection at this time which is great news and overall very pleased with the progress. No fevers, chills, nausea, vomiting, or diarrhea. 04/02/2020 upon evaluation today patient appears to be doing a little better in regard to her wound size wise this is not tremendously smaller she does have some slough buildup on the surface of the wound. With that being said this is going require some sharp debridement today to clear away some of the necrotic debris. Fortunately there is no evidence of active infection at this time. No fevers, chills, nausea, vomiting, or diarrhea. 04/10/2020 on evaluation today patient appears to be doing well with regard to her ulcer which is measuring somewhat smaller today. She actually has more granulation tissue noted which is also good news is no need for sharp debridement today. Fortunately there is no evidence of infection either which is also excellent. No fevers, chills, nausea, vomiting, or diarrhea. 04/26/2020 on evaluation today patient's wound actually is showing signs of good improvement which is great news. There does not appear to be any evidence of active infection. I do believe she is tolerating the collagen at this point. 05/03/2020 upon evaluation today patient's wound actually showed signs of good granulation at this time there does not appear to be any evidence of active infection which is great news and overall very pleased with where things stand. With that being said I do believe that the patient is can require some sharp debridement today but fortunately nothing too significant. 05/11/20 on evaluation today patient appears to be doing well in regard to her leg ulcer. She's been tolerating the dressing changes without complication. Fortunately there is no signs of active  infection at this time. Overall I'm very pleased  with where things stand. 05/18/2020 upon evaluation today patient's wound is showing signs of improvement is very dry and I think the collagen for the most part just dried to the wound bed. I am going to clean this away with sharp debridement in order to get this under control in my opinion. 05/25/2020 upon evaluation today patient actually appears to be doing well in regard to her leg ulcer. In fact upon inspection it appears she could potentially be completely healed but that is not guaranteed based on what we are seeing. There does not appear to be any signs of active infection which is great news. 05/31/2020 on evaluation today patient appears to be doing well with regard to her wounds. She in fact appears to be almost completely healed on the left leg there is just a very small opening remaining point 1 06/08/2020 upon evaluation today patient appears to be doing excellent in regard to her leg ulcer on the left in fact it appears to be that she is completely healed as of today. I see no signs of active infection at this time which is great news. No fevers, chills, nausea, vomiting, or diarrhea. READMISSION 01/16/2021 Patient that we have had in this clinic with a wound on the left posterior calf in the summer 2021 extending into October. She was felt to have chronic venous insufficiency. Per the patient's description she was discharged with what sounds like external compression stockings although she could not afford the "$75 per leg". She therefore did not get anything. Apparently her wound that she has currently is been in existence since January. She has been followed at vein and vascular with Unna boots and I think calcium alginate. She had ABIs done on 06/25/2020 that showed an noncompressible ABI on the right leg and the left leg but triphasic waveforms with good great toe pressures and a TBI of 0.90 on the right and 0.91 on the  left Surprisingly the patient also had venous reflux studies that were really unremarkable. This included no evidence of DVTs or SVTs but there was no evidence of deep venous insufficiency or superficial venous insufficiency in the greater saphenous or short saphenous veins bilaterally. I would wonder about more central venous issues or perhaps this is all lymphedema 01/25/2021 upon evaluation today patient appears to be doing decently well and guard to her wound. The good news is the Iodoflex that is job she saw Dr. Dellia Nims last week for readmission and to be honest I think that this has done extremely well over that week. I do believe that she can tolerate the sharp debridement today which will be good. I think that cleaning off the wound will likely allow [place] to be able to use a different type of dressing I do not think the Iodoflex will even be necessary based on how clean the wound looks. Fortunately there is no sign of active infection at this time which is great news. No fevers, chills, nausea, vomiting, or diarrhea. 02/12/2021 upon evaluation today patient appears to be doing well with regard to her leg ulcer. We did switch to alginate when she came for nurse visit I think is doing much better for her. Fortunately there does not appear to be any signs of active infection at this time which is great news. No fevers, chills, nausea, vomiting, or diarrhea. SATSUKI, ZILLMER ([id number]) 02/18/2021 upon evaluation today patient's wound is actually showing signs of good improvement. I am very pleased with where things stand currently. I do not  see any signs of active infection which is great news and overall I feel like she is making good progress. The patient likewise is happy that things are doing so well and that this is measuring somewhat smaller. 02/25/2021 upon evaluation today patient actually appears to be doing quite well in regard to her wounds. She has been tolerating the dressing changes  without complication. Fortunately there does not appear to be any signs of active infection which is great news and overall I am extremely pleased with where things stand. No fevers, chills, nausea, vomiting, or diarrhea. She does have a lot of edema I really do think she needs compression socks to be worn in order to prevent things from causing additional issues for her currently. Especially on the right leg she should be wearing compression socks all the time to be honest. 03/04/2021 upon evaluation today patient's leg ulcer actually appears to be doing pretty well which is great news. There does not appear to be any significant change but overall the appearance is better even though the size is not necessarily reflecting a great improvement. Fortunately there does not appear to be any signs of active infection. No fevers, chills, nausea, vomiting, or diarrhea. Electronic Signature(s) Signed: 03/04/2021 5:37:39 PM By: Worthy Keeler PA-C Entered By: Worthy Keeler on 03/04/2021 17:37:39 TRICA, USERY ([id number]) -------------------------------------------------------------------------------- Physical Exam Details Patient Name: [name] Date of Service: 03/04/2021 2:00 PM Medical Record Number: [id number] Patient Account Number: [id number] Date of Birth/Sex: May 16, 1946 (75 y.o. F) Treating RN: Primary Care Provider: [name] Other Clinician: Referring Provider: [name] Treating Provider/Extender: Skipper Cliche in Treatment: 6 Constitutional Well-nourished and well-hydrated in no acute distress. Respiratory normal breathing without difficulty. Psychiatric this patient is able to make decisions and demonstrates good insight into disease process. Alert and Oriented x 3. pleasant and cooperative. Notes Upon inspection patient's wound bed showed signs of good granulation epithelization at this point. There does not appear to be any evidence of active infection which is  great news and overall very pleased with where we stand today. No fevers, chills, nausea, vomiting, or diarrhea. Electronic Signature(s) Signed: 03/04/2021 5:39:01 PM By: Worthy Keeler PA-C Entered By: Worthy Keeler on 03/04/2021 17:39:01 [name] ([id number]) -------------------------------------------------------------------------------- Physician Orders Details Patient Name: [name] Date of Service: 03/04/2021 2:00 PM Medical Record Number: [id number] Patient Account Number: [id number] Date of Birth/Sex: Dec 06, 1945 (75 y.o. F) Treating RN: Donnamarie Poag Primary Care Provider: [name] Other Clinician: Referring Provider: [name] Treating Provider/Extender: Skipper Cliche in Treatment: 6 Verbal / Phone Orders: No Diagnosis Coding ICD-10 Coding Code Description I89.0 Lymphedema, not elsewhere classified I87.332 Chronic venous hypertension (idiopathic) with ulcer and inflammation of left lower extremity L97.828 Non-pressure chronic ulcer of other part of left lower leg with other specified severity Follow-up Appointments o Return Appointment in 1 week. Bathing/ Shower/ Hygiene o May shower with wound dressing protected with water repellent cover or cast protector. Edema Control - Lymphedema / Segmental Compressive Device / Other Left Lower Extremity o Optional: One layer of unna paste to top of compression wrap (to act as an anchor). o Tubigrip single layer applied. - RIGHT LEG tubi grip applied "E" until you get your compression sock use this and it's hand washable o Patient to wear own compression stockings. Remove compression stockings every night before going to bed and put on every morning when getting up. - on RIGHT LEG call [employer] for compression sock 15-22 mm/Hg, measurements and phone number  on blue sheet [employer] on [employer] in [place] also has the compression socks o Elevate, Exercise Daily and Avoid Standing  for Long Periods of Time. o Elevate legs to the level of the heart and pump ankles as often as possible o Elevate leg(s) parallel to the floor when sitting. o Compression Pump: Use compression pump on left lower extremity for 60 minutes, twice daily. - You may pump over wraps o Compression Pump: Use compression pump on right lower extremity for 60 minutes, twice daily. - You may pump over wraps Additional Orders / Instructions Wound #2 Left,Lateral Lower Leg o Follow Nutritious Diet and Increase Protein Intake - Increase protein intake. Wound Treatment Wound #2 - Lower Leg Wound Laterality: Left, Lateral Cleanser: Soap and Water 1 x Per Week/30 Days Discharge Instructions: Gently cleanse wound with antibacterial soap, rinse and pat dry prior to dressing wounds Topical: Triamcinolone Acetonide Cream, 0.1%, 15 (g) tube 1 x Per Week/30 Days Discharge Instructions: Apply as directed by provider. Primary Dressing: Silvercel Small 2x2 (in/in) 1 x Per Week/30 Days Discharge Instructions: Apply Silvercel Small 2x2 (in/in) as instructed Secondary Dressing: Xtrasorb Medium 4x5 (in/in) 1 x Per Week/30 Days Discharge Instructions: Apply to wound as directed. Do not cut. Compression Wrap: Profore Lite LF 3 Multilayer Compression Bandaging System 1 x Per Week/30 Days Discharge Instructions: Apply 3 multi-layer wrap as prescribed. Electronic Signature(s) Signed: 03/04/2021 4:19:43 PM By: Donnamarie Poag Signed: 03/04/2021 5:58:21 PM By: Worthy Keeler PA-C Entered By: Donnamarie Poag on 03/04/2021 14:28:40 CHIANA, WAMSER ([id number]) CARYNN, FELLING ([id number]) -------------------------------------------------------------------------------- Problem List Details Patient Name: [name] Date of Service: 03/04/2021 2:00 PM Medical Record Number: [id number] Patient Account Number: [id number] Date of Birth/Sex: [birth year] (75 y.o. F) Treating RN: Primary Care Provider: [name] Other  Clinician: Referring Provider: [name] Treating Provider/Extender: Skipper Cliche in Treatment: 6 Active Problems ICD-10 Encounter Code Description Active Date MDM Diagnosis I89.0 Lymphedema, not elsewhere classified 01/16/2021 No Yes I87.332 Chronic venous hypertension (idiopathic) with ulcer and inflammation of 01/16/2021 No Yes left lower extremity L97.828 Non-pressure chronic ulcer of other part of left lower leg with other 01/16/2021 No Yes specified severity Inactive Problems Resolved Problems Electronic Signature(s) Signed: 03/04/2021 2:07:44 PM By: Worthy Keeler PA-C Entered By: Worthy Keeler on 03/04/2021 14:07:43 [name] ([id number]) -------------------------------------------------------------------------------- Progress Note Details Patient Name: [name] Date of Service: 03/04/2021 2:00 PM Medical Record Number: [id number] Patient Account Number: [id number] Date of Birth/Sex: 06-27-1946 (75 y.o. F) Treating RN: Primary Care Provider: [name] Other Clinician: Referring Provider: [name] Treating Provider/Extender: Skipper Cliche in Treatment: 6 Subjective Chief Complaint Information obtained from Patient Left LE Ulcer History of Present Illness (HPI) 03/15/2020 upon evaluation today patient presents today for initial evaluation here in our clinic concerning a wound that is on the left posterior lower extremity. Unfortunately this has been giving the patient some discomfort at this point which she notes has been affected her pretty much daily. The patient does have a history of venous insufficiency, hypertension, congestive heart failure, and a history of breast cancer. Fortunately there does not appear to be evidence of active infection at this time which is great news. No fevers, chills, nausea, vomiting, or diarrhea. Wound is not extremely large but does have some slough covering the surface of the wound. This is going require  sharp debridement today. 03/15/2020 upon evaluation today patient appears to be doing fairly well in regard to her wound. She does have some slough noted on the  surface of the wound currently but I do believe the Iodoflex has been beneficial over the past week. There is no signs of active infection at this time which is great news and overall very pleased with the progress. No fevers, chills, nausea, vomiting, or diarrhea. 04/02/2020 upon evaluation today patient appears to be doing a little better in regard to her wound size wise this is not tremendously smaller she does have some slough buildup on the surface of the wound. With that being said this is going require some sharp debridement today to clear away some of the necrotic debris. Fortunately there is no evidence of active infection at this time. No fevers, chills, nausea, vomiting, or diarrhea. 04/10/2020 on evaluation today patient appears to be doing well with regard to her ulcer which is measuring somewhat smaller today. She actually has more granulation tissue noted which is also good news is no need for sharp debridement today. Fortunately there is no evidence of infection either which is also excellent. No fevers, chills, nausea, vomiting, or diarrhea. 04/26/2020 on evaluation today patient's wound actually is showing signs of good improvement which is great news. There does not appear to be any evidence of active infection. I do believe she is tolerating the collagen at this point. 05/03/2020 upon evaluation today patient's wound actually showed signs of good granulation at this time there does not appear to be any evidence of active infection which is great news and overall very pleased with where things stand. With that being said I do believe that the patient is can require some sharp debridement today but fortunately nothing too significant. 05/11/20 on evaluation today patient appears to be doing well in regard to her leg ulcer. She's been  tolerating the dressing changes without complication. Fortunately there is no signs of active infection at this time. Overall I'm very pleased with where things stand. 05/18/2020 upon evaluation today patient's wound is showing signs of improvement is very dry and I think the collagen for the most part just dried to the wound bed. I am going to clean this away with sharp debridement in order to get this under control in my opinion. 05/25/2020 upon evaluation today patient actually appears to be doing well in regard to her leg ulcer. In fact upon inspection it appears she could potentially be completely healed but that is not guaranteed based on what we are seeing. There does not appear to be any signs of active infection which is great news. 05/31/2020 on evaluation today patient appears to be doing well with regard to her wounds. She in fact appears to be almost completely healed on the left leg there is just a very small opening remaining point 1 06/08/2020 upon evaluation today patient appears to be doing excellent in regard to her leg ulcer on the left in fact it appears to be that she is completely healed as of today. I see no signs of active infection at this time which is great news. No fevers, chills, nausea, vomiting, or diarrhea. READMISSION 01/16/2021 Patient that we have had in this clinic with a wound on the left posterior calf in the summer 2021 extending into October. She was felt to have chronic venous insufficiency. Per the patient's description she was discharged with what sounds like external compression stockings although she could not afford the "$75 per leg". She therefore did not get anything. Apparently her wound that she has currently is been in existence since January. She has been followed at vein and  vascular with Unna boots and I think calcium alginate. She had ABIs done on 06/25/2020 that showed an noncompressible ABI on the right leg and the left leg but triphasic waveforms  with good great toe pressures and a TBI of 0.90 on the right and 0.91 on the left Surprisingly the patient also had venous reflux studies that were really unremarkable. This included no evidence of DVTs or SVTs but there was no evidence of deep venous insufficiency or superficial venous insufficiency in the greater saphenous or short saphenous veins bilaterally. I would wonder about more central venous issues or perhaps this is all lymphedema 01/25/2021 upon evaluation today patient appears to be doing decently well and guard to her wound. The good news is the Iodoflex that is job she saw Dr. Dellia Nims last week for readmission and to be honest I think that this has done extremely well over that week. I do believe that she can tolerate the sharp debridement today which will be good. I think that cleaning off the wound will likely allow [place] to be able to use a different type of dressing I do not think the Iodoflex will even be necessary based on how clean the wound looks. Fortunately there is no sign of active infection at this time which is great news. No fevers, chills, nausea, vomiting, or diarrhea. FRANCINA, BEERY ([id number]) 02/12/2021 upon evaluation today patient appears to be doing well with regard to her leg ulcer. We did switch to alginate when she came for nurse visit I think is doing much better for her. Fortunately there does not appear to be any signs of active infection at this time which is great news. No fevers, chills, nausea, vomiting, or diarrhea. 02/18/2021 upon evaluation today patient's wound is actually showing signs of good improvement. I am very pleased with where things stand currently. I do not see any signs of active infection which is great news and overall I feel like she is making good progress. The patient likewise is happy that things are doing so well and that this is measuring somewhat smaller. 02/25/2021 upon evaluation today patient actually appears to be doing quite  well in regard to her wounds. She has been tolerating the dressing changes without complication. Fortunately there does not appear to be any signs of active infection which is great news and overall I am extremely pleased with where things stand. No fevers, chills, nausea, vomiting, or diarrhea. She does have a lot of edema I really do think she needs compression socks to be worn in order to prevent things from causing additional issues for her currently. Especially on the right leg she should be wearing compression socks all the time to be honest. 03/04/2021 upon evaluation today patient's leg ulcer actually appears to be doing pretty well which is great news. There does not appear to be any significant change but overall the appearance is better even though the size is not necessarily reflecting a great improvement. Fortunately there does not appear to be any signs of active infection. No fevers, chills, nausea, vomiting, or diarrhea. Objective Constitutional Well-nourished and well-hydrated in no acute distress. Vitals Time Taken: 2:03 PM, Height: 69 in, Weight: 244 lbs, BMI: 36, Temperature: 99.6 F, Pulse: 71 bpm, Respiratory Rate: 18 breaths/min, Blood Pressure: 154/78 mmHg. Respiratory normal breathing without difficulty. Psychiatric this patient is able to make decisions and demonstrates good insight into disease process. Alert and Oriented x 3. pleasant and cooperative. General Notes: Upon inspection patient's wound bed showed signs of  good granulation epithelization at this point. There does not appear to be any evidence of active infection which is great news and overall very pleased with where we stand today. No fevers, chills, nausea, vomiting, or diarrhea. Integumentary (Hair, Skin) Wound #2 status is Open. Original cause of wound was Gradually Appeared. The date acquired was: 09/08/2020. The wound has been in treatment 6 weeks. The wound is located on the Left,Lateral Lower Leg.  The wound measures 3cm length x 3.4cm width x 0.2cm depth; 8.011cm^2 area and 1.602cm^3 volume. There is Fat Layer (Subcutaneous Tissue) exposed. There is no tunneling or undermining noted. There is a medium amount of serosanguineous drainage noted. There is medium (34-66%) red granulation within the wound bed. There is a medium (34-66%) amount of necrotic tissue within the wound bed including Adherent Slough. 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 There was a Three Layer Compression Therapy JAYSA, KISE ([id number]) Procedure by Donnamarie Poag, RN. Post procedure Diagnosis Wound #2: Same as Pre-Procedure Plan Follow-up Appointments: Return Appointment in 1 week. Bathing/ Shower/ Hygiene: May shower with wound dressing protected with water repellent cover or cast protector. Edema Control - Lymphedema / Segmental Compressive Device / Other: Optional: One layer of unna paste to top of compression wrap (to act as an anchor). Tubigrip single layer applied. - RIGHT LEG tubi grip applied "E" until you get your compression sock use this and it's hand washable Patient to wear own compression stockings. Remove compression stockings every night before going to bed and put on every morning when getting up. - on RIGHT LEG call [employer] for compression sock 15-22 mm/Hg, measurements and phone number on blue sheet [employer] on [address] in [place] also has the compression socks Elevate, Exercise Daily and Avoid Standing for Long Periods of Time. Elevate legs to the level of the heart and pump ankles as often as possible Elevate leg(s) parallel to the floor  when sitting. Compression Pump: Use compression pump on left lower extremity for 60 minutes, twice daily. - You may pump over wraps Compression Pump: Use compression pump on right lower extremity for 60 minutes, twice daily. - You may pump over wraps Additional Orders / Instructions: Wound #2 Left,Lateral Lower Leg: Follow Nutritious Diet and Increase Protein Intake - Increase protein intake. WOUND #2: - Lower Leg Wound Laterality: Left, Lateral Cleanser: Soap and Water 1 x Per Week/30 Days Discharge Instructions: Gently cleanse wound with antibacterial soap, rinse and pat dry prior to dressing wounds Topical: Triamcinolone Acetonide Cream, 0.1%, 15 (g) tube 1 x Per Week/30 Days Discharge Instructions: Apply as directed by provider. Primary Dressing: Silvercel Small 2x2 (in/in) 1 x Per Week/30 Days Discharge Instructions: Apply Silvercel Small 2x2 (in/in) as instructed Secondary Dressing: Xtrasorb Medium 4x5 (in/in) 1 x Per Week/30 Days Discharge Instructions: Apply to wound as directed. Do not cut. Compression Wrap: Profore Lite LF 3 Multilayer Compression Bandaging System 1 x Per Week/30 Days Discharge Instructions: Apply 3 multi-layer wrap as prescribed. 1. I would recommend that we going continue with wound care measures as before and the patient is in agreement with the plan. This includes the use of the silver alginate dressing which I think has done a good job. 2. We will  continue with XtraSorb. 3. We will also continue with the 3 layer compression wrap which I think is doing an excellent job as well. We will see patient back for reevaluation in 1 week here in the clinic. If anything worsens or changes patient will contact our office for additional recommendations. Electronic Signature(s) Signed: 03/04/2021 5:39:21 PM By: Worthy Keeler PA-C Entered By: Worthy Keeler on 03/04/2021 17:39:21 [name]  ([id number]) -------------------------------------------------------------------------------- SuperBill Details Patient Name: [name] Date of Service: 03/04/2021 Medical Record Number: [id number] Patient Account Number: [id number] Date of Birth/Sex: 1946/01/30 (75 y.o. 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 Facility Procedures CPT4 Code: [id number] Description: (Facility Use Only) [phone] - [place] LWR LT LEG Modifier: Quantity: 1 Physician Procedures CPT4 Code Description: [id number] - WC PHYS LEVEL 3 - EST PT Modifier: Quantity: 1 CPT4 Code Description: ICD-10 Diagnosis Description I89.0 Lymphedema, not elsewhere classified I87.332 Chronic venous hypertension (idiopathic) with ulcer and inflammation of le L97.828 Non-pressure chronic ulcer of other part of left lower leg with  other spec Modifier: ft lower extremity ified severity Quantity: Electronic Signature(s) Signed: 03/04/2021 5:39:46 PM By: Worthy Keeler PA-C Previous Signature: 03/04/2021 4:19:43 PM Version By: Donnamarie Poag Entered By: Worthy Keeler on 03/04/2021 17:39:46

## 2021-03-05 NOTE — Progress Notes (Signed)
CLOVA, MORLOCK ([id number]) Visit Report for 03/04/2021 Arrival Information Details Patient Name: [name] Date of Service: 03/04/2021 2:00 PM Medical Record Number: [id number] Patient Account Number: [id number] Date of Birth/Sex: [birth year] (75 y.o. F) Treating RN: Dolan Amen Primary Care Madyn Ivins: [name] Other Clinician: Referring Cadyn Fann: [name] Treating Angeliyah Kirkey/Extender: Skipper Cliche in Treatment: 6 Visit Information History Since Last Visit Pain Present Now: Yes Patient Arrived: Walker Arrival Time: 14:02 Accompanied By: self Transfer Assistance: None Patient Identification Verified: Yes Secondary Verification Process Completed: Yes Patient Requires Transmission-Based Precautions: No Patient Has Alerts: Yes Patient Alerts: NOT DIABETIC Electronic Signature(s) Signed: 03/04/2021 4:30:50 PM By: Georges Mouse, Minus Breeding RN Entered By: Georges Mouse, Minus Breeding on 03/04/2021 14:03:02 [name] ([id number]) -------------------------------------------------------------------------------- Clinic Level of Care Assessment Details Patient Name: [name] Date of Service: 03/04/2021 2:00 PM Medical Record Number: [id number] Patient Account Number: [id number] Date of Birth/Sex: November 02, 1945 (75 y.o. F) Treating RN: Donnamarie Poag Primary Care Mazal Ebey: [name] Other Clinician: Referring Daltyn Degroat: [name] Treating Makaria Poarch/Extender: Skipper Cliche in Treatment: 6 Clinic Level of Care Assessment Items TOOL 1 Quantity Score []  - Use when EandM and Procedure is performed on INITIAL visit 0 ASSESSMENTS - Nursing Assessment / Reassessment []  - General Physical Exam (combine w/ comprehensive assessment (listed just below) when performed on new 0 pt. evals) []  - 0 Comprehensive Assessment (HX, ROS, Risk Assessments, Wounds Hx, etc.) ASSESSMENTS - Wound and Skin Assessment / Reassessment []  - Dermatologic / Skin Assessment (not related to  wound area) 0 ASSESSMENTS - Ostomy and/or Continence Assessment and Care []  - Incontinence Assessment and Management 0 []  - 0 Ostomy Care Assessment and Management (repouching, etc.) PROCESS - Coordination of Care []  - Simple Patient / Family Education for ongoing care 0 []  - 0 Complex (extensive) Patient / Family Education for ongoing care []  - 0 Staff obtains Programmer, systems, Records, Test Results / Process Orders []  - 0 Staff telephones HHA, Nursing Homes / Clarify orders / etc []  - 0 Routine Transfer to another Facility (non-emergent condition) []  - 0 Routine Hospital Admission (non-emergent condition) []  - 0 New Admissions / Biomedical engineer / Ordering NPWT, Apligraf, etc. []  - 0 Emergency Hospital Admission (emergent condition) PROCESS - Special Needs []  - Pediatric / Minor Patient Management 0 []  - 0 Isolation Patient Management []  - 0 Hearing / Language / Visual special needs []  - 0 Assessment of Community assistance (transportation, D/C planning, etc.) []  - 0 Additional assistance / Altered mentation []  - 0 Support Surface(s) Assessment (bed, cushion, seat, etc.) INTERVENTIONS - Miscellaneous []  - External ear exam 0 []  - 0 Patient Transfer (multiple staff / Civil Service fast streamer / Similar devices) []  - 0 Simple Staple / Suture removal (25 or less) []  - 0 Complex Staple / Suture removal (26 or more) []  - 0 Hypo/Hyperglycemic Management (do not check if billed separately) []  - 0 Ankle / Brachial Index (ABI) - do not check if billed separately Has the patient been seen at the hospital within the last three years: Yes Total Score: 0 Level Of Care: ____ [name] ([id number]) Electronic Signature(s) Signed: 03/04/2021 4:19:43 PM By: Donnamarie Poag Entered By: Donnamarie Poag on 03/04/2021 14:28:47 [name] ([id number]) -------------------------------------------------------------------------------- Compression Therapy Details Patient Name: [name] Date of Service: 03/04/2021 2:00 PM Medical Record Number: [id number] Patient Account Number: [id number] Date of Birth/Sex: 1946/08/11 (75 y.o. F) Treating RN: Donnamarie Poag Primary Care Khale Nigh: [name] Other Clinician: Referring Dillen Belmontes: [name] Treating Aneri Slagel/Extender: Skipper Cliche in Treatment: 6  Compression Therapy Performed for Wound Assessment: Wound #2 Left,Lateral Lower Leg Performed By: Clinician Donnamarie Poag, RN Compression Type: Three Layer Post Procedure Diagnosis Same as Pre-procedure Electronic Signature(s) Signed: 03/04/2021 4:19:43 PM By: Donnamarie Poag Entered By: Donnamarie Poag on 03/04/2021 14:27:29 [name] ([id number]) -------------------------------------------------------------------------------- Encounter Discharge Information Details Patient Name: [name] Date of Service: 03/04/2021 2:00 PM Medical Record Number: [id number] Patient Account Number: [id number] Date of Birth/Sex: 1945/09/25 (74 y.o. F) Treating RN: Carlene Coria Primary Care Shirly Bartosiewicz: [name] Other Clinician: Referring Suezette Lafave: [name] Treating Kylan Veach/Extender: Skipper Cliche in Treatment: 6 Encounter Discharge Information Items Discharge Condition: Stable Ambulatory Status: Walker Discharge Destination: Home Transportation: Private Auto Accompanied By: self Schedule Follow-up Appointment: Yes Clinical Summary of Care: Patient Declined Electronic Signature(s) Signed: 03/05/2021 8:03:47 AM By: Carlene Coria RN Entered By: Carlene Coria on 03/04/2021 14:43:57 [name] ([id number]) -------------------------------------------------------------------------------- Lower Extremity Assessment Details Patient Name: [name] Date of Service: 03/04/2021 2:00 PM Medical Record Number: [id number] Patient Account Number: [id number] Date of Birth/Sex: 13-Jan-1946 (75 y.o. F) Treating RN: Dolan Amen Primary Care Wally Behan: [name]  Other Clinician: Referring Quaron Delacruz: [name] Treating Besnik Febus/Extender: Skipper Cliche in Treatment: 6 Edema Assessment Assessed: [Left: Yes] [Right: No] Edema: [Left: Ye] [Right: s] Calf Left: Right: Point of Measurement: 27 cm From Medial Instep 36 cm Ankle Left: Right: Point of Measurement: 10 cm From Medial Instep 26.3 cm Vascular Assessment Pulses: Dorsalis Pedis Palpable: [Left:Yes] Electronic Signature(s) Signed: 03/04/2021 4:30:50 PM By: Georges Mouse, Minus Breeding RN Entered By: Georges Mouse, Minus Breeding on 03/04/2021 14:11:38 [name] ([id number]) -------------------------------------------------------------------------------- Multi Wound Chart Details Patient Name: [name] Date of Service: 03/04/2021 2:00 PM Medical Record Number: [id number] Patient Account Number: [id number] Date of Birth/Sex: [birth year] (75 y.o. F) Treating RN: Donnamarie Poag Primary Care Noam Karaffa: [name] Other Clinician: Referring Kincaid Tiger: [name] Treating Sahian Kerney/Extender: Skipper Cliche in Treatment: 6 Vital Signs Height(in): 69 Pulse(bpm): 83 Weight(lbs): 244 Blood Pressure(mmHg): 154/78 Body Mass Index(BMI): 36 Temperature(F): 99.6 Respiratory Rate(breaths/min): 18 Photos: [N/A:N/A] Wound Location: Left, Lateral Lower Leg N/A N/A Wounding Event: Gradually Appeared N/A N/A Primary Etiology: Venous Leg Ulcer N/A N/A Comorbid History: Cataracts, Lymphedema, Sleep N/A N/A Apnea, Congestive Heart Failure, Hypertension, Osteoarthritis, Neuropathy, Received Chemotherapy, Received Radiation Date Acquired: 09/08/2020 N/A N/A Weeks of Treatment: 6 N/A N/A Wound Status: Open N/A N/A Measurements L x W x D (cm) 3x3.4x0.2 N/A N/A Area (cm) : 8.011 N/A N/A Volume (cm) : 1.602 N/A N/A % Reduction in Area: -70.00% N/A N/A % Reduction in Volume: -240.10% N/A N/A Classification: Full Thickness Without Exposed N/A N/A Support Structures Exudate Amount: Medium N/A  N/A Exudate Type: Serosanguineous N/A N/A Exudate Color: red, brown N/A N/A Granulation Amount: Medium (34-66%) N/A N/A Granulation Quality: Red N/A N/A Necrotic Amount: Medium (34-66%) N/A N/A Exposed Structures: Fat Layer (Subcutaneous Tissue): N/A N/A Yes Fascia: No Tendon: No Muscle: No Joint: No Bone: No Epithelialization: Medium (34-66%) N/A N/A Treatment Notes Electronic Signature(s) Signed: 03/04/2021 4:19:43 PM By: Donnamarie Poag Entered By: Donnamarie Poag on 03/04/2021 14:26:11 [name] ([id number]) -------------------------------------------------------------------------------- [place] Details Patient Name: [name] Date of Service: 03/04/2021 2:00 PM Medical Record Number: [id number] Patient Account Number: [id number] Date of Birth/Sex: 04/05/1946 (75 y.o. F) Treating RN: Donnamarie Poag Primary Care Clark Cuff: [name] Other Clinician: Referring Donnita Farina: [name] Treating Gaelyn Tukes/Extender: Skipper Cliche in Treatment: 6 Active Inactive Wound/Skin Impairment Nursing Diagnoses: Impaired tissue integrity Goals: Patient/caregiver will verbalize understanding of skin care regimen Date Initiated: 01/16/2021 Date Inactivated: 02/18/2021  Target Resolution Date: 02/16/2021 Goal Status: Met Ulcer/skin breakdown will have a volume reduction of 30% by week 4 Date Initiated: 01/16/2021 Date Inactivated: 02/18/2021 Target Resolution Date: 02/16/2021 Goal Status: Met Ulcer/skin breakdown will have a volume reduction of 50% by week 8 Date Initiated: 01/16/2021 Target Resolution Date: 03/18/2021 Goal Status: Active Interventions: Assess ulceration(s) every visit Treatment Activities: Topical wound management initiated : 01/16/2021 Notes: Electronic Signature(s) Signed: 03/04/2021 4:19:43 PM By: Donnamarie Poag Entered By: Donnamarie Poag on 03/04/2021 14:26:02 [name]  ([id number]) -------------------------------------------------------------------------------- Pain Assessment Details Patient Name: [name] Date of Service: 03/04/2021 2:00 PM Medical Record Number: [id number] Patient Account Number: [id number] Date of Birth/Sex: [birth year] (75 y.o. F) Treating RN: Dolan Amen Primary Care Chalet Kerwin: [name] Other Clinician: Referring Loyd Marhefka: [name] Treating Brando Taves/Extender: Skipper Cliche in Treatment: 6 Active Problems Location of Pain Severity and Description of Pain Patient Has Paino Yes Site Locations Pain Location: Pain in Ulcers Rate the pain. Current Pain Level: 8 Pain Management and Medication Current Pain Management: Electronic Signature(s) Signed: 03/04/2021 4:30:50 PM By: Georges Mouse, Minus Breeding RN Entered By: Georges Mouse, Minus Breeding on 03/04/2021 14:04:39 [name] ([id number]) -------------------------------------------------------------------------------- Patient/Caregiver Education Details Patient Name: [name] Date of Service: 03/04/2021 2:00 PM Medical Record Number: [id number] Patient Account Number: [id number] Date of Birth/Gender: Jan 24, 1946 (75 y.o. F) Treating RN: Donnamarie Poag Primary Care Physician: [name] Other Clinician: Referring Physician: [name] Treating Physician/Extender: Skipper Cliche in Treatment: 6 Education Assessment Education Provided To: Patient Education Topics Provided Basic Hygiene: Wound/Skin Impairment: Electronic Signature(s) Signed: 03/04/2021 4:19:43 PM By: Donnamarie Poag Entered By: Donnamarie Poag on 03/04/2021 14:29:11 [name] ([id number]) -------------------------------------------------------------------------------- Wound Assessment Details Patient Name: [name] Date of Service: 03/04/2021 2:00 PM Medical Record Number: [id number] Patient Account Number: [id number] Date of Birth/Sex: 06/28/1946 (75 y.o. F) Treating RN:  Dolan Amen Primary Care Nakiesha Rumsey: [name] Other Clinician: Referring Keigan Girten: [name] Treating Solace Wendorff/Extender: Skipper Cliche in Treatment: 6 Wound Status Wound Number: 2 Primary Venous Leg Ulcer Etiology: Wound Location: Left, Lateral Lower Leg Wound Open Wounding Event: Gradually Appeared Status: Date Acquired: 09/08/2020 Comorbid Cataracts, Lymphedema, Sleep Apnea, Congestive Heart Weeks Of Treatment: 6 History: Failure, Hypertension, Osteoarthritis, Neuropathy, Clustered Wound: No Received Chemotherapy, Received Radiation Photos Wound Measurements Length: (cm) 3 Width: (cm) 3.4 Depth: (cm) 0.2 Area: (cm) 8.011 Volume: (cm) 1.602 % Reduction in Area: -70% % Reduction in Volume: -240.1% Epithelialization: Medium (34-66%) Tunneling: No Undermining: No Wound Description Classification: Full Thickness Without Exposed Support Structu Exudate Amount: Medium Exudate Type: Serosanguineous Exudate Color: red, brown res Foul Odor After Cleansing: No Slough/Fibrino Yes Wound Bed Granulation Amount: Medium (34-66%) Exposed Structure Granulation Quality: Red Fascia Exposed: No Necrotic Amount: Medium (34-66%) Fat Layer (Subcutaneous Tissue) Exposed: Yes Necrotic Quality: Adherent Slough Tendon Exposed: No Muscle Exposed: No Joint Exposed: No Bone Exposed: No Treatment Notes Wound #2 (Lower Leg) Wound Laterality: Left, Lateral Cleanser Soap and Water Discharge Instruction: Gently cleanse wound with antibacterial soap, rinse and pat dry prior to dressing wounds Peri-Wound Care Nicholls, Hattie ([id number]) Topical Triamcinolone Acetonide Cream, 0.1%, 15 (g) tube Discharge Instruction: Apply as directed by Bonnie Overdorf. Primary Dressing Silvercel Small 2x2 (in/in) Discharge Instruction: Apply Silvercel Small 2x2 (in/in) as instructed Secondary Dressing Xtrasorb Medium 4x5 (in/in) Discharge Instruction: Apply to wound as directed. Do not cut. Secured  With Compression Wrap Profore Lite LF 3 Multilayer Compression Bandaging System Discharge Instruction: Apply 3 multi-layer wrap as prescribed. Compression Stockings Add-Ons Electronic Signature(s) Signed: 03/04/2021 4:30:50 PM By: Georges Mouse, Minus Breeding  RN Entered By: Georges Mouse, Minus Breeding on 03/04/2021 14:09:48 [name] ([id number]) -------------------------------------------------------------------------------- [place] Details Patient Name: [name] Date of Service: 03/04/2021 2:00 PM Medical Record Number: [id number] Patient Account Number: [id number] Date of Birth/Sex: 1946-03-14 (75 y.o. F) Treating RN: Dolan Amen Primary Care Jade Burright: [name] Other Clinician: Referring Luian Schumpert: [name] Treating Fitzgerald Dunne/Extender: Skipper Cliche in Treatment: 6 Vital Signs Time Taken: 14:03 Temperature (F): 99.6 Height (in): 69 Pulse (bpm): 71 Weight (lbs): 244 Respiratory Rate (breaths/min): 18 Body Mass Index (BMI): 36 Blood Pressure (mmHg): 154/78 Reference Range: 80 - 120 mg / dl Electronic Signature(s) Signed: 03/04/2021 4:30:50 PM By: Georges Mouse, Minus Breeding RN Entered By: Georges Mouse, Minus Breeding on 03/04/2021 14:04:24

## 2021-03-12 ENCOUNTER — Encounter: Payer: Medicare Other | Attending: Physician Assistant | Admitting: Physician Assistant

## 2021-03-12 ENCOUNTER — Other Ambulatory Visit: Payer: Self-pay

## 2021-03-12 DIAGNOSIS — L97822 Non-pressure chronic ulcer of other part of left lower leg with fat layer exposed: Secondary | ICD-10-CM | POA: Diagnosis not present

## 2021-03-12 DIAGNOSIS — Z853 Personal history of malignant neoplasm of breast: Secondary | ICD-10-CM | POA: Diagnosis not present

## 2021-03-12 DIAGNOSIS — I890 Lymphedema, not elsewhere classified: Secondary | ICD-10-CM | POA: Diagnosis present

## 2021-03-12 DIAGNOSIS — I872 Venous insufficiency (chronic) (peripheral): Secondary | ICD-10-CM | POA: Insufficient documentation

## 2021-03-12 NOTE — Progress Notes (Addendum)
[name], [name] ([id number]) Visit Report for 03/12/2021 Arrival Information Details Patient Name: [name], [name] Date of Service: 03/12/2021 2:30 PM Medical Record Number: [id number] Patient Account Number: [id number] Date of Birth/Sex: September 05, 1946 (75 y.o. F) Treating RN: Carlene Coria Primary Care Shine Mikes: [name] Other Clinician: Referring Gurbani Figge: [name] Treating Ramar Nobrega/Extender: Skipper Cliche in Treatment: 7 Visit Information History Since Last Visit All ordered tests and consults were completed: No Patient Arrived: Gilford Rile Added or deleted any medications: No Arrival Time: 14:55 Any new allergies or adverse reactions: No Accompanied By: self Had a fall or experienced change in No Transfer Assistance: None activities of daily living that may affect Patient Identification Verified: Yes risk of falls: Secondary Verification Process Completed: Yes Signs or symptoms of abuse/neglect since last visito No Patient Requires Transmission-Based Precautions: No Hospitalized since last visit: No Patient Has Alerts: Yes Implantable device outside of the clinic excluding No Patient Alerts: NOT DIABETIC cellular tissue based products placed in the center since last visit: Has Dressing in Place as Prescribed: Yes Has Compression in Place as Prescribed: Yes Pain Present Now: No Electronic Signature(s) Signed: 03/12/2021 5:06:20 PM By: Carlene Coria RN Entered By: Carlene Coria on 03/12/2021 14:58:53 [name] ([id number]) -------------------------------------------------------------------------------- Clinic Level of Care Assessment Details Patient Name: [name] Date of Service: 03/12/2021 2:30 PM Medical Record Number: [id number] Patient Account Number: [id number] Date of Birth/Sex: 05/06/1946 (75 y.o. F) Treating RN: Carlene Coria Primary Care Tawan Corkern: [name] Other Clinician: Referring Jirah Rider: [name] Treating Aidyn Sportsman/Extender: Skipper Cliche in Treatment: 7 Clinic Level of Care Assessment Items TOOL 1 Quantity Score []  - Use when EandM and Procedure is performed on INITIAL visit 0 ASSESSMENTS - Nursing Assessment / Reassessment []  - General Physical Exam (combine w/ comprehensive assessment (listed just below) when performed on new 0 pt. evals) []  - 0 Comprehensive Assessment (HX, ROS, Risk Assessments, Wounds Hx, etc.) ASSESSMENTS - Wound and Skin Assessment / Reassessment []  - Dermatologic / Skin Assessment (not related to wound area) 0 ASSESSMENTS - Ostomy and/or Continence Assessment and Care []  - Incontinence Assessment and Management 0 []  - 0 Ostomy Care Assessment and Management (repouching, etc.) PROCESS - Coordination of Care []  - Simple Patient / Family Education for ongoing care 0 []  - 0 Complex (extensive) Patient / Family Education for ongoing care []  - 0 Staff obtains Programmer, systems, Records, Test Results / Process Orders []  - 0 Staff telephones HHA, Nursing Homes / Clarify orders / etc []  - 0 Routine Transfer to another Facility (non-emergent condition) []  - 0 Routine Hospital Admission (non-emergent condition) []  - 0 New Admissions / Biomedical engineer / Ordering NPWT, Apligraf, etc. []  - 0 Emergency Hospital Admission (emergent condition) PROCESS - Special Needs []  - Pediatric / Minor Patient Management 0 []  - 0 Isolation Patient Management []  - 0 Hearing / Language / Visual special needs []  - 0 Assessment of Community assistance (transportation, D/C planning, etc.) []  - 0 Additional assistance / Altered mentation []  - 0 Support Surface(s) Assessment (bed, cushion, seat, etc.) INTERVENTIONS - Miscellaneous []  - External ear exam 0 []  - 0 Patient Transfer (multiple staff / Civil Service fast streamer / Similar devices) []  - 0 Simple Staple / Suture removal (25 or less) []  - 0 Complex Staple / Suture removal (26 or more) []  - 0 Hypo/Hyperglycemic Management (do not check if billed  separately) []  - 0 Ankle / Brachial Index (ABI) - do not check if billed separately Has the patient been seen at the hospital within the last  three years: Yes Total Score: 0 Level Of Care: ____ [name] ([id number]) Electronic Signature(s) Signed: 03/12/2021 5:06:20 PM By: Carlene Coria RN Entered By: Carlene Coria on 03/12/2021 15:13:54 [name] ([id number]) -------------------------------------------------------------------------------- Compression Therapy Details Patient Name: [name] Date of Service: 03/12/2021 2:30 PM Medical Record Number: [id number] Patient Account Number: [id number] Date of Birth/Sex: 25-Feb-1946 (75 y.o. F) Treating RN: Carlene Coria Primary Care Tecia Cinnamon: [name] Other Clinician: Referring Mahmud Keithly: [name] Treating Cleone Hulick/Extender: Skipper Cliche in Treatment: 7 Compression Therapy Performed for Wound Assessment: Wound #2 Left,Lateral Lower Leg Performed By: Clinician Carlene Coria, RN Compression Type: Three Layer Post Procedure Diagnosis Same as Pre-procedure Electronic Signature(s) Signed: 03/12/2021 5:06:20 PM By: Carlene Coria RN Entered By: Carlene Coria on 03/12/2021 15:12:36 [name] ([id number]) -------------------------------------------------------------------------------- Encounter Discharge Information Details Patient Name: [name] Date of Service: 03/12/2021 2:30 PM Medical Record Number: [id number] Patient Account Number: [id number] Date of Birth/Sex: August 02, 1946 (75 y.o. F) Treating RN: Carlene Coria Primary Care Shakeema Lippman: [name] Other Clinician: Referring Elisheva Fallas: [name] Treating Tezra Mahr/Extender: Skipper Cliche in Treatment: 7 Encounter Discharge Information Items Discharge Condition: Stable Ambulatory Status: Walker Discharge Destination: Home Transportation: Private Auto Accompanied By: self Schedule Follow-up Appointment: Yes Clinical Summary of Care: Patient  Declined Electronic Signature(s) Signed: 03/12/2021 5:06:20 PM By: Carlene Coria RN Entered By: Carlene Coria on 03/12/2021 15:24:19 [name] ([id number]) -------------------------------------------------------------------------------- Lower Extremity Assessment Details Patient Name: [name] Date of Service: 03/12/2021 2:30 PM Medical Record Number: [id number] Patient Account Number: [id number] Date of Birth/Sex: 07/22/1946 (75 y.o. F) Treating RN: Carlene Coria Primary Care Gelsey Amyx: [name] Other Clinician: Referring Lithzy Bernard: [name] Treating Draydon Clairmont/Extender: Skipper Cliche in Treatment: 7 Edema Assessment Assessed: [Left: No] [Right: No] Edema: [Left: Ye] [Right: s] Calf Left: Right: Point of Measurement: 27 cm From Medial Instep 38 cm Ankle Left: Right: Point of Measurement: 10 cm From Medial Instep 26 cm Vascular Assessment Pulses: Dorsalis Pedis Palpable: [Left:Yes] Electronic Signature(s) Signed: 03/12/2021 5:06:20 PM By: Carlene Coria RN Entered By: Carlene Coria on 03/12/2021 15:04:14 [name] ([id number]) -------------------------------------------------------------------------------- Multi Wound Chart Details Patient Name: [name] Date of Service: 03/12/2021 2:30 PM Medical Record Number: [id number] Patient Account Number: [id number] Date of Birth/Sex: 1945/10/26 (75 y.o. F) Treating RN: Carlene Coria Primary Care Shandria Clinch: [name] Other Clinician: Referring Chauncy Mangiaracina: [name] Treating Adisa Litt/Extender: Skipper Cliche in Treatment: 7 Vital Signs Height(in): 32 Pulse(bpm): 75 Weight(lbs): 244 Blood Pressure(mmHg): 130/77 Body Mass Index(BMI): 36 Temperature(F): 98.9 Respiratory Rate(breaths/min): 18 Photos: [N/A:N/A] Wound Location: Left, Lateral Lower Leg N/A N/A Wounding Event: Gradually Appeared N/A N/A Primary Etiology: Venous Leg Ulcer N/A N/A Comorbid History: Cataracts, Lymphedema, Sleep N/A  N/A Apnea, Congestive Heart Failure, Hypertension, Osteoarthritis, Neuropathy, Received Chemotherapy, Received Radiation Date Acquired: 09/08/2020 N/A N/A Weeks of Treatment: 7 N/A N/A Wound Status: Open N/A N/A Measurements L x W x D (cm) 3x3x0.2 N/A N/A Area (cm) : 7.069 N/A N/A Volume (cm) : 1.414 N/A N/A % Reduction in Area: -50.00% N/A N/A % Reduction in Volume: -200.20% N/A N/A Classification: Full Thickness Without Exposed N/A N/A Support Structures Exudate Amount: Medium N/A N/A Exudate Type: Serosanguineous N/A N/A Exudate Color: red, brown N/A N/A Granulation Amount: Medium (34-66%) N/A N/A Granulation Quality: Red N/A N/A Necrotic Amount: Medium (34-66%) N/A N/A Exposed Structures: Fat Layer (Subcutaneous Tissue): N/A N/A Yes Fascia: No Tendon: No Muscle: No Joint: No Bone: No Epithelialization: Medium (34-66%) N/A N/A Treatment Notes Electronic Signature(s) Signed: 03/12/2021 5:06:20 PM By: Carlene Coria RN Entered By:  Carlene Coria on 03/12/2021 15:12:16 GRACEANN, BOILEAU ([id number]) -------------------------------------------------------------------------------- Multi-Disciplinary Care Plan Details Patient Name: LENE, MCKAY Date of Service: 03/12/2021 2:30 PM Medical Record Number: [id number] Patient Account Number: [id number] Date of Birth/Sex: 08/12/1946 (75 y.o. F) Treating RN: Carlene Coria Primary Care Cina Klumpp: [name] Other Clinician: Referring Sanvi Ehler: [name] Treating Dhiren Azimi/Extender: Skipper Cliche in Treatment: 7 Active Inactive Wound/Skin Impairment Nursing Diagnoses: Impaired tissue integrity Goals: Patient/caregiver will verbalize understanding of skin care regimen Date Initiated: 01/16/2021 Date Inactivated: 02/18/2021 Target Resolution Date: 02/16/2021 Goal Status: Met Ulcer/skin breakdown will have a volume reduction of 30% by week 4 Date Initiated: 01/16/2021 Date Inactivated: 02/18/2021 Target Resolution Date:  02/16/2021 Goal Status: Met Ulcer/skin breakdown will have a volume reduction of 50% by week 8 Date Initiated: 01/16/2021 Target Resolution Date: 03/18/2021 Goal Status: Active Interventions: Assess ulceration(s) every visit Treatment Activities: Topical wound management initiated : 01/16/2021 Notes: Electronic Signature(s) Signed: 03/12/2021 5:06:20 PM By: Carlene Coria RN Entered By: Carlene Coria on 03/12/2021 15:12:05 [name] ([id number]) -------------------------------------------------------------------------------- Pain Assessment Details Patient Name: [name] Date of Service: 03/12/2021 2:30 PM Medical Record Number: [id number] Patient Account Number: [id number] Date of Birth/Sex: 1946-01-04 (75 y.o. F) Treating RN: Carlene Coria Primary Care Kurstin Dimarzo: [name] Other Clinician: Referring Ariam Mol: [name] Treating Vidal Lampkins/Extender: Skipper Cliche in Treatment: 7 Active Problems Location of Pain Severity and Description of Pain Patient Has Paino No Site Locations Pain Management and Medication Current Pain Management: Electronic Signature(s) Signed: 03/12/2021 5:06:20 PM By: Carlene Coria RN Entered By: Carlene Coria on 03/12/2021 14:59:21 [name] ([id number]) -------------------------------------------------------------------------------- Patient/Caregiver Education Details Patient Name: [name] Date of Service: 03/12/2021 2:30 PM Medical Record Number: [id number] Patient Account Number: [id number] Date of Birth/Gender: 09-22-1945 (75 y.o. F) Treating RN: Carlene Coria Primary Care Physician: [name] Other Clinician: Referring Physician: [name] Treating Physician/Extender: Skipper Cliche in Treatment: 7 Education Assessment Education Provided To: Patient Education Topics Provided Wound/Skin Impairment: Methods: Explain/Verbal Responses: State content correctly Electronic Signature(s) Signed: 03/12/2021 5:06:20 PM  By: Carlene Coria RN Entered By: Carlene Coria on 03/12/2021 15:14:19 [name] ([id number]) -------------------------------------------------------------------------------- Wound Assessment Details Patient Name: [name] Date of Service: 03/12/2021 2:30 PM Medical Record Number: [id number] Patient Account Number: [id number] Date of Birth/Sex: 02-28-1946 (75 y.o. F) Treating RN: Carlene Coria Primary Care Trishia Cuthrell: [name] Other Clinician: Referring Mae Cianci: [name] Treating Loray Akard/Extender: Skipper Cliche in Treatment: 7 Wound Status Wound Number: 2 Primary Venous Leg Ulcer Etiology: Wound Location: Left, Lateral Lower Leg Wound Open Wounding Event: Gradually Appeared Status: Date Acquired: 09/08/2020 Comorbid Cataracts, Lymphedema, Sleep Apnea, Congestive Heart Weeks Of Treatment: 7 History: Failure, Hypertension, Osteoarthritis, Neuropathy, Clustered Wound: No Received Chemotherapy, Received Radiation Photos Wound Measurements Length: (cm) 3 Width: (cm) 3 Depth: (cm) 0.2 Area: (cm) 7.069 Volume: (cm) 1.414 % Reduction in Area: -50% % Reduction in Volume: -200.2% Epithelialization: Medium (34-66%) Tunneling: No Undermining: No Wound Description Classification: Full Thickness Without Exposed Support Structu Exudate Amount: Medium Exudate Type: Serosanguineous Exudate Color: red, brown res Foul Odor After Cleansing: No Slough/Fibrino Yes Wound Bed Granulation Amount: Medium (34-66%) Exposed Structure Granulation Quality: Red Fascia Exposed: No Necrotic Amount: Medium (34-66%) Fat Layer (Subcutaneous Tissue) Exposed: Yes Necrotic Quality: Adherent Slough Tendon Exposed: No Muscle Exposed: No Joint Exposed: No Bone Exposed: No Treatment Notes Wound #2 (Lower Leg) Wound Laterality: Left, Lateral Cleanser Soap and Water Discharge Instruction: Gently cleanse wound with antibacterial soap, rinse and pat dry prior to dressing  wounds Peri-Wound Care [place], [place] ([id number]) Topical Triamcinolone  Acetonide Cream, 0.1%, 15 (g) tube Discharge Instruction: Apply as directed by Wadie Mattie. Primary Dressing Silvercel Small 2x2 (in/in) Discharge Instruction: Apply Silvercel Small 2x2 (in/in) as instructed Secondary Dressing Xtrasorb Medium 4x5 (in/in) Discharge Instruction: Apply to wound as directed. Do not cut. Secured With Compression Wrap Profore Lite LF 3 Multilayer Compression Bandaging System Discharge Instruction: Apply 3 multi-layer wrap as prescribed. Compression Stockings Add-Ons Electronic Signature(s) Signed: 03/12/2021 5:06:20 PM By: Carlene Coria RN Entered By: Carlene Coria on 03/12/2021 15:03:33 [name] ([id number]) -------------------------------------------------------------------------------- [place] Details Patient Name: [name] Date of Service: 03/12/2021 2:30 PM Medical Record Number: [id number] Patient Account Number: [id number] Date of Birth/Sex: 1945-10-04 (75 y.o. F) Treating RN: Carlene Coria Primary Care Miyo Aina: [name] Other Clinician: Referring Dawson Albers: [name] Treating Lailyn Appelbaum/Extender: Skipper Cliche in Treatment: 7 Vital Signs Time Taken: 14:58 Temperature (F): 98.9 Height (in): 69 Pulse (bpm): 66 Weight (lbs): 244 Respiratory Rate (breaths/min): 18 Body Mass Index (BMI): 36 Blood Pressure (mmHg): 130/77 Reference Range: 80 - 120 mg / dl Electronic Signature(s) Signed: 03/12/2021 5:06:20 PM By: Carlene Coria RN Entered By: Carlene Coria on 03/12/2021 14:59:15

## 2021-03-12 NOTE — Progress Notes (Addendum)
[name], [name] ([id number]) Visit Report for 03/12/2021 Chief Complaint Document Details Patient Name: [name], [name] Date of Service: 03/12/2021 2:30 PM Medical Record Number: [id number] Patient Account Number: [id number] Date of Birth/Sex: 1946-03-02 (75 y.o. F) Treating RN: Carlene Coria Primary Care Provider: [name] Other Clinician: Referring Provider: [name] Treating Provider/Extender: Skipper Cliche in Treatment: 7 Information Obtained from: Patient Chief Complaint Left LE Ulcer Electronic Signature(s) Signed: 03/12/2021 2:52:22 PM By: Worthy Keeler PA-C Entered By: Worthy Keeler on 03/12/2021 14:52:22 [name] ([id number]) -------------------------------------------------------------------------------- HPI Details Patient Name: [name] Date of Service: 03/12/2021 2:30 PM Medical Record Number: [id number] Patient Account Number: [id number] Date of Birth/Sex: 09/05/1946 (75 y.o. F) Treating RN: Carlene Coria Primary Care Provider: [name] Other Clinician: Referring Provider: [name] Treating Provider/Extender: Skipper Cliche in Treatment: 7 History of Present Illness HPI Description: 03/15/2020 upon evaluation today patient presents today for initial evaluation here in our clinic concerning a wound that is on the left posterior lower extremity. Unfortunately this has been giving the patient some discomfort at this point which she notes has been affected her pretty much daily. The patient does have a history of venous insufficiency, hypertension, congestive heart failure, and a history of breast cancer. Fortunately there does not appear to be evidence of active infection at this time which is great news. No fevers, chills, nausea, vomiting, or diarrhea. Wound is not extremely large but does have some slough covering the surface of the wound. This is going require sharp debridement today. 03/15/2020 upon evaluation today patient appears to  be doing fairly well in regard to her wound. She does have some slough noted on the surface of the wound currently but I do believe the Iodoflex has been beneficial over the past week. There is no signs of active infection at this time which is great news and overall very pleased with the progress. No fevers, chills, nausea, vomiting, or diarrhea. 04/02/2020 upon evaluation today patient appears to be doing a little better in regard to her wound size wise this is not tremendously smaller she does have some slough buildup on the surface of the wound. With that being said this is going require some sharp debridement today to clear away some of the necrotic debris. Fortunately there is no evidence of active infection at this time. No fevers, chills, nausea, vomiting, or diarrhea. 04/10/2020 on evaluation today patient appears to be doing well with regard to her ulcer which is measuring somewhat smaller today. She actually has more granulation tissue noted which is also good news is no need for sharp debridement today. Fortunately there is no evidence of infection either which is also excellent. No fevers, chills, nausea, vomiting, or diarrhea. 04/26/2020 on evaluation today patient's wound actually is showing signs of good improvement which is great news. There does not appear to be any evidence of active infection. I do believe she is tolerating the collagen at this point. 05/03/2020 upon evaluation today patient's wound actually showed signs of good granulation at this time there does not appear to be any evidence of active infection which is great news and overall very pleased with where things stand. With that being said I do believe that the patient is can require some sharp debridement today but fortunately nothing too significant. 05/11/20 on evaluation today patient appears to be doing well in regard to her leg ulcer. She's been tolerating the dressing changes without complication. Fortunately there is  no signs of active infection at this time.  Overall I'm very pleased with where things stand. 05/18/2020 upon evaluation today patient's wound is showing signs of improvement is very dry and I think the collagen for the most part just dried to the wound bed. I am going to clean this away with sharp debridement in order to get this under control in my opinion. 05/25/2020 upon evaluation today patient actually appears to be doing well in regard to her leg ulcer. In fact upon inspection it appears she could potentially be completely healed but that is not guaranteed based on what we are seeing. There does not appear to be any signs of active infection which is great news. 05/31/2020 on evaluation today patient appears to be doing well with regard to her wounds. She in fact appears to be almost completely healed on the left leg there is just a very small opening remaining point 1 06/08/2020 upon evaluation today patient appears to be doing excellent in regard to her leg ulcer on the left in fact it appears to be that she is completely healed as of today. I see no signs of active infection at this time which is great news. No fevers, chills, nausea, vomiting, or diarrhea. READMISSION 01/16/2021 Patient that we have had in this clinic with a wound on the left posterior calf in the summer 2021 extending into October. She was felt to have chronic venous insufficiency. Per the patient's description she was discharged with what sounds like external compression stockings although she could not afford the "$75 per leg". She therefore did not get anything. Apparently her wound that she has currently is been in existence since January. She has been followed at vein and vascular with Unna boots and I think calcium alginate. She had ABIs done on 06/25/2020 that showed an noncompressible ABI on the right leg and the left leg but triphasic waveforms with good great toe pressures and a TBI of 0.90 on the right and 0.91 on  the left Surprisingly the patient also had venous reflux studies that were really unremarkable. This included no evidence of DVTs or SVTs but there was no evidence of deep venous insufficiency or superficial venous insufficiency in the greater saphenous or short saphenous veins bilaterally. I would wonder about more central venous issues or perhaps this is all lymphedema 01/25/2021 upon evaluation today patient appears to be doing decently well and guard to her wound. The good news is the Iodoflex that is job she saw Dr. Dellia Nims last week for readmission and to be honest I think that this has done extremely well over that week. I do believe that she can tolerate the sharp debridement today which will be good. I think that cleaning off the wound will likely allow [place] to be able to use a different type of dressing I do not think the Iodoflex will even be necessary based on how clean the wound looks. Fortunately there is no sign of active infection at this time which is great news. No fevers, chills, nausea, vomiting, or diarrhea. 02/12/2021 upon evaluation today patient appears to be doing well with regard to her leg ulcer. We did switch to alginate when she came for nurse visit I think is doing much better for her. Fortunately there does not appear to be any signs of active infection at this time which is great news. No fevers, chills, nausea, vomiting, or diarrhea. MARLEAN, MORTELL ([id number]) 02/18/2021 upon evaluation today patient's wound is actually showing signs of good improvement. I am very pleased with where things stand  currently. I do not see any signs of active infection which is great news and overall I feel like she is making good progress. The patient likewise is happy that things are doing so well and that this is measuring somewhat smaller. 02/25/2021 upon evaluation today patient actually appears to be doing quite well in regard to her wounds. She has been tolerating the dressing changes  without complication. Fortunately there does not appear to be any signs of active infection which is great news and overall I am extremely pleased with where things stand. No fevers, chills, nausea, vomiting, or diarrhea. She does have a lot of edema I really do think she needs compression socks to be worn in order to prevent things from causing additional issues for her currently. Especially on the right leg she should be wearing compression socks all the time to be honest. 03/04/2021 upon evaluation today patient's leg ulcer actually appears to be doing pretty well which is great news. There does not appear to be any significant change but overall the appearance is better even though the size is not necessarily reflecting a great improvement. Fortunately there does not appear to be any signs of active infection. No fevers, chills, nausea, vomiting, or diarrhea. 03/12/2021 upon evaluation today patient appears to be doing well with regard to her wounds. She has been tolerating the dressing changes without complication. The good news is that the wound on her left lateral leg is doing much better and is showing signs of good improvement. Overall her swelling is controlled well as well. She does need some compression socks she plans to go Jefferey socks next week. Electronic Signature(s) Signed: 03/12/2021 3:15:49 PM By: Worthy Keeler PA-C Entered By: Worthy Keeler on 03/12/2021 15:15:49 [name] ([id number]) -------------------------------------------------------------------------------- Physical Exam Details Patient Name: [name] Date of Service: 03/12/2021 2:30 PM Medical Record Number: [id number] Patient Account Number: [id number] Date of Birth/Sex: [birth year] (75 y.o. F) Treating RN: Carlene Coria Primary Care Provider: [name] Other Clinician: Referring Provider: [name] Treating Provider/Extender: Skipper Cliche in Treatment: 7 Constitutional Obese and  well-hydrated in no acute distress. Respiratory normal breathing without difficulty. Psychiatric this patient is able to make decisions and demonstrates good insight into disease process. Alert and Oriented x 3. pleasant and cooperative. Notes Upon inspection patient's wound bed actually showed signs of good granulation and epithelization at this point. Fortunately there does not appear to be any signs of active infection which is great and overall I am extremely pleased with where things stand today. Electronic Signature(s) Signed: 03/12/2021 3:25:04 PM By: Worthy Keeler PA-C Entered By: Worthy Keeler on 03/12/2021 15:25:04 [name] ([id number]) -------------------------------------------------------------------------------- Physician Orders Details Patient Name: [name] Date of Service: 03/12/2021 2:30 PM Medical Record Number: [id number] Patient Account Number: [id number] Date of Birth/Sex: 03-17-1946 (75 y.o. 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 Bathing/ Shower/ Hygiene o May shower with wound dressing protected with water repellent cover or cast protector. Edema Control - Lymphedema / Segmental Compressive Device / Other Left Lower Extremity o Optional: One layer of unna paste to top  of compression wrap (to act as an anchor). o Tubigrip single layer applied. - RIGHT LEG tubi grip applied "E" until you get your compression sock use this and it's hand  washable o Patient to wear own compression stockings. Remove compression stockings every night before going to bed and put on every morning when getting up. - on RIGHT LEG call [employer] for compression sock 15-22 mm/Hg, measurements and phone number on blue sheet [employer] on [employer] in [place] also has the compression socks o Elevate, Exercise Daily and Avoid Standing for Long Periods of Time. o Elevate legs to the level of the heart and pump ankles as often as possible o Elevate leg(s) parallel to the floor when sitting. o Compression Pump: Use compression pump on left lower extremity for 60 minutes, twice daily. - You may pump over wraps o Compression Pump: Use compression pump on right lower extremity for 60 minutes, twice daily. - You may pump over wraps Additional Orders / Instructions Wound #2 Left,Lateral Lower Leg o Follow Nutritious Diet and Increase Protein Intake - Increase protein intake. Wound Treatment Wound #2 - Lower Leg Wound Laterality: Left, Lateral Cleanser: Soap and Water 1 x Per Week/30 Days Discharge Instructions: Gently cleanse wound with antibacterial soap, rinse and pat dry prior to dressing wounds Topical: Triamcinolone Acetonide Cream, 0.1%, 15 (g) tube 1 x Per Week/30 Days Discharge Instructions: Apply as directed by provider. Primary Dressing: Silvercel Small 2x2 (in/in) 1 x Per Week/30 Days Discharge Instructions: Apply Silvercel Small 2x2 (in/in) as instructed Secondary Dressing: Xtrasorb Medium 4x5 (in/in) 1 x Per Week/30 Days Discharge Instructions: Apply to wound as directed. Do not cut. Compression Wrap: Profore Lite LF 3 Multilayer Compression Bandaging System 1 x Per Week/30 Days Discharge Instructions: Apply 3 multi-layer wrap as prescribed. Electronic Signature(s) Signed: 03/12/2021 4:57:45 PM By: Worthy Keeler PA-C Signed: 03/12/2021 5:06:20 PM By: Carlene Coria RN Entered By: Carlene Coria on 03/12/2021  15:13:42 KAMAIYA, ANTILLA ([id number]) SHAKERRIA, PARRAN ([id number]) -------------------------------------------------------------------------------- Problem List Details Patient Name: [name] Date of Service: 03/12/2021 2:30 PM Medical Record Number: [id number] Patient Account Number: [id number] Date of Birth/Sex: October 28, 1945 (75 y.o. F) Treating RN: Carlene Coria Primary Care Provider: [name] Other Clinician: Referring Provider: [name] Treating Provider/Extender: Skipper Cliche in Treatment: 7 Active Problems ICD-10 Encounter Code Description Active Date MDM Diagnosis I89.0 Lymphedema, not elsewhere classified 01/16/2021 No Yes I87.332 Chronic venous hypertension (idiopathic) with ulcer and inflammation of 01/16/2021 No Yes left lower extremity L97.828 Non-pressure chronic ulcer of other part of left lower leg with other 01/16/2021 No Yes specified severity Inactive Problems Resolved Problems Electronic Signature(s) Signed: 03/12/2021 2:33:54 PM By: Worthy Keeler PA-C Entered By: Worthy Keeler on 03/12/2021 14:33:54 [name] ([id number]) -------------------------------------------------------------------------------- Progress Note Details Patient Name: [name] Date of Service: 03/12/2021 2:30 PM Medical Record Number: [id number] Patient Account Number: [id number] Date of Birth/Sex: 30-May-1946 (75 y.o. F) Treating RN: Carlene Coria Primary Care Provider: [name] Other Clinician: Referring Provider: [name] Treating Provider/Extender: Skipper Cliche in Treatment: 7 Subjective Chief Complaint Information obtained from Patient Left LE Ulcer History of Present Illness (HPI) 03/15/2020 upon evaluation today patient presents today for initial evaluation here in our clinic concerning a wound that is on the left posterior lower extremity. Unfortunately this has been giving the patient some discomfort at this point which she notes has  been affected her pretty much daily. The patient does have a history of venous insufficiency, hypertension, congestive heart failure, and  a history of breast cancer. Fortunately there does not appear to be evidence of active infection at this time which is great news. No fevers, chills, nausea, vomiting, or diarrhea. Wound is not extremely large but does have some slough covering the surface of the wound. This is going require sharp debridement today. 03/15/2020 upon evaluation today patient appears to be doing fairly well in regard to her wound. She does have some slough noted on the surface of the wound currently but I do believe the Iodoflex has been beneficial over the past week. There is no signs of active infection at this time which is great news and overall very pleased with the progress. No fevers, chills, nausea, vomiting, or diarrhea. 04/02/2020 upon evaluation today patient appears to be doing a little better in regard to her wound size wise this is not tremendously smaller she does have some slough buildup on the surface of the wound. With that being said this is going require some sharp debridement today to clear away some of the necrotic debris. Fortunately there is no evidence of active infection at this time. No fevers, chills, nausea, vomiting, or diarrhea. 04/10/2020 on evaluation today patient appears to be doing well with regard to her ulcer which is measuring somewhat smaller today. She actually has more granulation tissue noted which is also good news is no need for sharp debridement today. Fortunately there is no evidence of infection either which is also excellent. No fevers, chills, nausea, vomiting, or diarrhea. 04/26/2020 on evaluation today patient's wound actually is showing signs of good improvement which is great news. There does not appear to be any evidence of active infection. I do believe she is tolerating the collagen at this point. 05/03/2020 upon evaluation today  patient's wound actually showed signs of good granulation at this time there does not appear to be any evidence of active infection which is great news and overall very pleased with where things stand. With that being said I do believe that the patient is can require some sharp debridement today but fortunately nothing too significant. 05/11/20 on evaluation today patient appears to be doing well in regard to her leg ulcer. She's been tolerating the dressing changes without complication. Fortunately there is no signs of active infection at this time. Overall I'm very pleased with where things stand. 05/18/2020 upon evaluation today patient's wound is showing signs of improvement is very dry and I think the collagen for the most part just dried to the wound bed. I am going to clean this away with sharp debridement in order to get this under control in my opinion. 05/25/2020 upon evaluation today patient actually appears to be doing well in regard to her leg ulcer. In fact upon inspection it appears she could potentially be completely healed but that is not guaranteed based on what we are seeing. There does not appear to be any signs of active infection which is great news. 05/31/2020 on evaluation today patient appears to be doing well with regard to her wounds. She in fact appears to be almost completely healed on the left leg there is just a very small opening remaining point 1 06/08/2020 upon evaluation today patient appears to be doing excellent in regard to her leg ulcer on the left in fact it appears to be that she is completely healed as of today. I see no signs of active infection at this time which is great news. No fevers, chills, nausea, vomiting, or diarrhea. READMISSION 01/16/2021 Patient that we  have had in this clinic with a wound on the left posterior calf in the summer 2021 extending into October. She was felt to have chronic venous insufficiency. Per the patient's description she was  discharged with what sounds like external compression stockings although she could not afford the "$75 per leg". She therefore did not get anything. Apparently her wound that she has currently is been in existence since January. She has been followed at vein and vascular with Unna boots and I think calcium alginate. She had ABIs done on 06/25/2020 that showed an noncompressible ABI on the right leg and the left leg but triphasic waveforms with good great toe pressures and a TBI of 0.90 on the right and 0.91 on the left Surprisingly the patient also had venous reflux studies that were really unremarkable. This included no evidence of DVTs or SVTs but there was no evidence of deep venous insufficiency or superficial venous insufficiency in the greater saphenous or short saphenous veins bilaterally. I would wonder about more central venous issues or perhaps this is all lymphedema 01/25/2021 upon evaluation today patient appears to be doing decently well and guard to her wound. The good news is the Iodoflex that is job she saw Dr. Dellia Nims last week for readmission and to be honest I think that this has done extremely well over that week. I do believe that she can tolerate the sharp debridement today which will be good. I think that cleaning off the wound will likely allow [place] to be able to use a different type of dressing I do not think the Iodoflex will even be necessary based on how clean the wound looks. Fortunately there is no sign of active infection at this time which is great news. No fevers, chills, nausea, vomiting, or diarrhea. BOBBE, QUILTER ([id number]) 02/12/2021 upon evaluation today patient appears to be doing well with regard to her leg ulcer. We did switch to alginate when she came for nurse visit I think is doing much better for her. Fortunately there does not appear to be any signs of active infection at this time which is great news. No fevers, chills, nausea, vomiting, or  diarrhea. 02/18/2021 upon evaluation today patient's wound is actually showing signs of good improvement. I am very pleased with where things stand currently. I do not see any signs of active infection which is great news and overall I feel like she is making good progress. The patient likewise is happy that things are doing so well and that this is measuring somewhat smaller. 02/25/2021 upon evaluation today patient actually appears to be doing quite well in regard to her wounds. She has been tolerating the dressing changes without complication. Fortunately there does not appear to be any signs of active infection which is great news and overall I am extremely pleased with where things stand. No fevers, chills, nausea, vomiting, or diarrhea. She does have a lot of edema I really do think she needs compression socks to be worn in order to prevent things from causing additional issues for her currently. Especially on the right leg she should be wearing compression socks all the time to be honest. 03/04/2021 upon evaluation today patient's leg ulcer actually appears to be doing pretty well which is great news. There does not appear to be any significant change but overall the appearance is better even though the size is not necessarily reflecting a great improvement. Fortunately there does not appear to be any signs of active infection. No fevers, chills, nausea,  vomiting, or diarrhea. 03/12/2021 upon evaluation today patient appears to be doing well with regard to her wounds. She has been tolerating the dressing changes without complication. The good news is that the wound on her left lateral leg is doing much better and is showing signs of good improvement. Overall her swelling is controlled well as well. She does need some compression socks she plans to go Jefferey socks next week. Objective Constitutional Obese and well-hydrated in no acute distress. Vitals Time Taken: 2:58 PM, Height: 69 in, Weight:  244 lbs, BMI: 36, Temperature: 98.9 F, Pulse: 66 bpm, Respiratory Rate: 18 breaths/min, Blood Pressure: 130/77 mmHg. Respiratory normal breathing without difficulty. Psychiatric this patient is able to make decisions and demonstrates good insight into disease process. Alert and Oriented x 3. pleasant and cooperative. General Notes: Upon inspection patient's wound bed actually showed signs of good granulation and epithelization at this point. Fortunately there does not appear to be any signs of active infection which is great and overall I am extremely pleased with where things stand today. Integumentary (Hair, Skin) Wound #2 status is Open. Original cause of wound was Gradually Appeared. The date acquired was: 09/08/2020. The wound has been in treatment 7 weeks. The wound is located on the Left,Lateral Lower Leg. The wound measures 3cm length x 3cm width x 0.2cm depth; 7.069cm^2 area and 1.414cm^3 volume. There is Fat Layer (Subcutaneous Tissue) exposed. There is no tunneling or undermining noted. There is a medium amount of serosanguineous drainage noted. There is medium (34-66%) red granulation within the wound bed. There is a medium (34-66%) amount of necrotic tissue within the wound bed including Adherent Slough. 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 There was a Three Layer Compression Therapy Procedure by Carlene Coria, RN. Post procedure Diagnosis Wound #2: Same as Pre-Procedure Plan Follow-up Appointments: Return Appointment in 1 week. Bathing/ Shower/ Hygiene: May shower with wound dressing protected with water repellent  cover or cast protector. Edema Control - Lymphedema / Segmental Compressive Device / Other: Optional: One layer of unna paste to top of compression wrap (to act as an anchor). Tubigrip single layer applied. - RIGHT LEG tubi grip applied "E" until you get your compression sock use this and it's hand washable Patient to wear own compression stockings. Remove compression stockings every night before going to bed and put on every morning when getting up. - on RIGHT LEG call [employer] for compression sock 15-22 mm/Hg, measurements and phone number on blue sheet [employer] on [employer] in [place] also has the compression socks Elevate, Exercise Daily and Avoid Standing for Long Periods of Time. Elevate legs to the level of the heart and pump ankles as often as possible Elevate leg(s) parallel to the floor when sitting. Compression Pump: Use compression pump on left lower extremity for 60 minutes, twice daily. - You may pump over wraps Compression Pump: Use compression pump on right lower extremity for 60 minutes, twice daily. - You may pump over wraps Additional Orders / Instructions: Wound #2 Left,Lateral Lower Leg: Follow Nutritious Diet and Increase Protein Intake - Increase protein intake. WOUND #2: - Lower Leg Wound Laterality: Left, Lateral Cleanser: Soap and Water 1 x Per Week/30 Days  Discharge Instructions: Gently cleanse wound with antibacterial soap, rinse and pat dry prior to dressing wounds Topical: Triamcinolone Acetonide Cream, 0.1%, 15 (g) tube 1 x Per Week/30 Days Discharge Instructions: Apply as directed by provider. Primary Dressing: Silvercel Small 2x2 (in/in) 1 x Per Week/30 Days Discharge Instructions: Apply Silvercel Small 2x2 (in/in) as instructed Secondary Dressing: Xtrasorb Medium 4x5 (in/in) 1 x Per Week/30 Days Discharge Instructions: Apply to wound as directed. Do not cut. Compression Wrap: Profore Lite LF 3 Multilayer Compression Bandaging System 1 x  Per Week/30 Days Discharge Instructions: Apply 3 multi-layer wrap as prescribed. 1. Would recommend currently that we go ahead and continue with the wound care measures as before using the triamcinolone followed by the silver alginate dressing which I think is doing a great job. 2. I am also going to continue with XtraSorb. 3. We will continue with 3 layer compression wrap that seems to be doing well for her. We will see patient back for reevaluation in 1 week here in the clinic. If anything worsens or changes patient will contact our office for additional recommendations. Electronic Signature(s) Signed: 03/12/2021 3:25:53 PM By: Worthy Keeler PA-C Entered By: Worthy Keeler on 03/12/2021 15:25:52 [name] ([id number]) -------------------------------------------------------------------------------- SuperBill Details Patient Name: [name] Date of Service: 03/12/2021 Medical Record Number: [id number] Patient Account Number: [id number] Date of Birth/Sex: 28-May-1946 (75 y.o. F) Treating RN: Carlene Coria Primary Care Provider: [name] Other Clinician: Referring Provider: [name] Treating Provider/Extender: Skipper Cliche in Treatment: 7 Diagnosis Coding ICD-10 Codes Code Description I89.0 Lymphedema, not elsewhere classified I87.332 Chronic venous hypertension (idiopathic) with ulcer and inflammation of left lower extremity L97.828 Non-pressure chronic ulcer of other part of left lower leg with other specified severity Facility Procedures CPT4 Code: [id number] Description: (Facility Use Only) [phone] - [facility] LWR LT LEG Modifier: Quantity: 1 Physician Procedures CPT4 Code Description: [id number] - WC PHYS LEVEL 3 - EST PT Modifier: Quantity: 1 CPT4 Code Description: ICD-10 Diagnosis Description I89.0 Lymphedema, not elsewhere classified I87.332 Chronic venous hypertension (idiopathic) with ulcer and inflammation of le L97.828 Non-pressure  chronic ulcer of other part of left lower leg with  other spec Modifier: ft lower extremity ified severity Quantity: Electronic Signature(s) Signed: 03/12/2021 3:26:06 PM By: Worthy Keeler PA-C Entered By: Worthy Keeler on 03/12/2021 15:26:05

## 2021-03-18 ENCOUNTER — Other Ambulatory Visit: Payer: Self-pay

## 2021-03-18 ENCOUNTER — Encounter: Payer: Medicare Other | Admitting: Physician Assistant

## 2021-03-18 DIAGNOSIS — I890 Lymphedema, not elsewhere classified: Secondary | ICD-10-CM | POA: Diagnosis not present

## 2021-03-18 NOTE — Progress Notes (Addendum)
[name], [name] ([id number]) Visit Report for 03/18/2021 Arrival Information Details Patient Name: [name], [name] Date of Service: 03/18/2021 2:15 PM Medical Record Number: [id number] Patient Account Number: [id number] Date of Birth/Sex: Jan 17, 1946 (75 y.o. F) Treating RN: Carlene Coria Primary Care Glennis Borger: [name] Other Clinician: Referring Hafsah Hendler: [name] Treating Jasani [name]/Extender: Skipper Cliche in Treatment: 8 Visit Information History Since Last Visit All ordered tests and consults were completed: No Patient Arrived: Kasandra Knudsen Added or deleted any medications: No Arrival Time: 14:17 Any new allergies or adverse reactions: No Accompanied By: self Had a fall or experienced change in No Transfer Assistance: None activities of daily living that may affect Patient Identification Verified: Yes risk of falls: Secondary Verification Process Completed: Yes Signs or symptoms of abuse/neglect since last visito No Patient Requires Transmission-Based Precautions: No Hospitalized since last visit: No Patient Has Alerts: Yes Implantable device outside of the clinic excluding No Patient Alerts: NOT DIABETIC cellular tissue based products placed in the center since last visit: Has Dressing in Place as Prescribed: Yes Has Compression in Place as Prescribed: Yes Pain Present Now: No Electronic Signature(s) Signed: 03/18/2021 7:11:39 PM By: Carlene Coria RN Entered By: Carlene Coria on 03/18/2021 14:22:53 [name] ([id number]) -------------------------------------------------------------------------------- Clinic Level of Care Assessment Details Patient Name: [name] Date of Service: 03/18/2021 2:15 PM Medical Record Number: [id number] Patient Account Number: [id number] Date of Birth/Sex: 1946/07/31 (75 y.o. F) Treating RN: Carlene Coria Primary Care Ardenia Stiner: [name] Other Clinician: Referring Kamani Lewter: [name] Treating Aliveah Gallant/Extender: Skipper Cliche in Treatment: 8 Clinic Level of Care Assessment Items TOOL 1 Quantity Score _0  - Use when EandM and Procedure is performed on INITIAL visit 0 ASSESSMENTS - Nursing Assessment / Reassessment _1  - General Physical Exam (combine w/ comprehensive assessment (listed just below) when performed on new 0 pt. evals) _2  - 0 Comprehensive Assessment (HX, ROS, Risk Assessments, Wounds Hx, etc.) ASSESSMENTS - Wound and Skin Assessment / Reassessment _3  - Dermatologic / Skin Assessment (not related to wound area) 0 ASSESSMENTS - Ostomy and/or Continence Assessment and Care _4  - Incontinence Assessment and Management 0 _5  - 0 Ostomy Care Assessment and Management (repouching, etc.) PROCESS - Coordination of Care _6  - Simple Patient / Family Education for ongoing care 0 _7  - 0 Complex (extensive) Patient / Family Education for ongoing care _8  - 0 Staff obtains Programmer, systems, Records, Test Results / Process Orders _9  - 0 Staff telephones HHA, Nursing Homes / Clarify orders / etc _10  - 0 Routine Transfer to another Facility (non-emergent condition) _11  - 0 Routine Hospital Admission (non-emergent condition) _12  - 0 New Admissions / Biomedical engineer / Ordering NPWT, Apligraf, etc. _13  - 0 Emergency Hospital Admission (emergent condition) PROCESS - Special Needs _14  - Pediatric / Minor Patient Management 0 _15  - 0 Isolation Patient Management _16  - 0 Hearing / Language / Visual special needs _17  - 0 Assessment of Community assistance (transportation, D/C planning, etc.) _18  - 0 Additional assistance / Altered mentation _19  - 0 Support Surface(s) Assessment (bed, cushion, seat, etc.) INTERVENTIONS - Miscellaneous _20  - External ear exam 0 _21  - 0 Patient Transfer (multiple staff / Civil Service fast streamer / Similar devices) _22  - 0 Simple Staple / Suture removal (25 or less) _23  - 0 Complex Staple / Suture removal (26 or more) _24  - 0 Hypo/Hyperglycemic Management (do not check if billed  separately) _25  - 0 Ankle / Brachial Index (ABI) - do not check if billed separately Has the patient been seen at the hospital within the last  three years: Yes Total Score: 0 Level Of Care: ____ [name] ([id number]) Electronic Signature(s) Signed: 03/18/2021 7:11:39 PM By: Carlene Coria RN Entered By: Carlene Coria on 03/18/2021 14:42:03 [name] ([id number]) -------------------------------------------------------------------------------- Compression Therapy Details Patient Name: [name] Date of Service: 03/18/2021 2:15 PM Medical Record Number: [id number] Patient Account Number: [id number] Date of Birth/Sex: May 01, 1946 (75 y.o. F) Treating RN: Carlene Coria Primary Care Treanna Dumler: [name] Other Clinician: Referring Dorina Ribaudo: [name] Treating Anjelique Makar/Extender: Skipper Cliche in Treatment: 8 Compression Therapy Performed for Wound Assessment: Wound #2 Left,Lateral Lower Leg Performed By: Clinician Carlene Coria, RN Compression Type: Three Layer Post Procedure Diagnosis Same as Pre-procedure Electronic Signature(s) Signed: 03/18/2021 7:11:39 PM By: Carlene Coria RN Entered By: Carlene Coria on 03/18/2021 14:41:16 [name] ([id number]) -------------------------------------------------------------------------------- Encounter Discharge Information Details Patient Name: [name] Date of Service: 03/18/2021 2:15 PM Medical Record Number: [id number] Patient Account Number: [id number] Date of Birth/Sex: 1946-02-10 (75 y.o. F) Treating RN: Carlene Coria Primary Care Zola Runion: [name] Other Clinician: Referring Tierre Gerard: [name] Treating Kyriaki Moder/Extender: Skipper Cliche in Treatment: 8 Encounter Discharge Information Items Post Procedure Vitals Discharge Condition: Stable Temperature (F): 98.7 Ambulatory Status: Walker Pulse (bpm): 67 Discharge Destination: Home Respiratory Rate (breaths/min): 18 Transportation: Private  Auto Blood Pressure (mmHg): 168/78 Accompanied By: self Schedule Follow-up Appointment: Yes Clinical Summary of Care: Patient Declined Electronic Signature(s) Signed: 03/18/2021 7:11:39 PM By: Carlene Coria RN Entered By: Carlene Coria on 03/18/2021 14:50:50 [name] ([id number]) -------------------------------------------------------------------------------- Lower Extremity Assessment Details Patient Name: [name] Date of Service: 03/18/2021 2:15 PM Medical Record Number: [id number] Patient Account Number: [id number] Date of Birth/Sex: March 10, 1946 (75 y.o. F) Treating RN: Carlene Coria Primary Care Mithran Strike: [name] Other Clinician: Referring Trinika Cortese: [name] Treating Landy Dunnavant/Extender: Skipper Cliche in Treatment: 8 Edema Assessment Assessed: [Left: No] [Right: No] Edema: [Left: Ye] [Right: s] Calf Left: Right: Point of Measurement: 27 cm From Medial Instep 37 cm Ankle Left: Right: Point of Measurement: 10 cm From Medial Instep 25 cm Vascular Assessment Pulses: Dorsalis Pedis Palpable: [Left:Yes] Electronic Signature(s) Signed: 03/18/2021 7:11:39 PM By: Carlene Coria RN Entered By: Carlene Coria on 03/18/2021 14:31:04 [name] ([id number]) -------------------------------------------------------------------------------- Multi Wound Chart Details Patient Name: [name] Date of Service: 03/18/2021 2:15 PM Medical Record Number: [id number] Patient Account Number: [id number] Date of Birth/Sex: 1946/05/19 (75 y.o. F) Treating RN: Carlene Coria Primary Care Harel Repetto: [name] Other Clinician: Referring Elisama Thissen: [name] Treating Lota Leamer/Extender: Skipper Cliche in Treatment: 8 Vital Signs Height(in): 69 Pulse(bpm): 14 Weight(lbs): 244 Blood Pressure(mmHg): 168/78 Body Mass Index(BMI): 36 Temperature(F): 98.7 Respiratory Rate(breaths/min): 18 Photos: [N/A:N/A] Wound Location: Left, Lateral Lower Leg N/A N/A Wounding  Event: Gradually Appeared N/A N/A Primary Etiology: Venous Leg Ulcer N/A N/A Comorbid History: Cataracts, Lymphedema, Sleep N/A N/A Apnea, Congestive Heart Failure, Hypertension, Osteoarthritis, Neuropathy, Received Chemotherapy, Received Radiation Date Acquired: 09/08/2020 N/A N/A Weeks of Treatment: 8 N/A N/A Wound Status: Open N/A N/A Measurements L x W x D (cm) 2.5x3x0.2 N/A N/A Area (cm) : 5.89 N/A N/A Volume (cm) : 1.178 N/A N/A % Reduction in Area: -25.00% N/A N/A % Reduction in Volume: -150.10% N/A N/A Classification: Full Thickness Without Exposed N/A N/A Support Structures Exudate Amount: Medium N/A N/A Exudate Type: Serosanguineous N/A N/A Exudate Color: red, brown N/A N/A Granulation Amount: Medium (34-66%) N/A N/A Granulation Quality: Red N/A N/A Necrotic Amount: Medium (34-66%) N/A N/A Exposed Structures: Fat Layer (Subcutaneous Tissue): N/A N/A Yes Fascia: No Tendon: No Muscle: No Joint: No Bone: No Epithelialization: Medium (  34-66%) N/A N/A Treatment Notes Electronic Signature(s) Signed: 03/18/2021 7:11:39 PM By: Carlene Coria RN Entered By: Carlene Coria on 03/18/2021 14:39:37 [name] ([id number]) -------------------------------------------------------------------------------- [place] Details Patient Name: [name] Date of Service: 03/18/2021 2:15 PM Medical Record Number: [id number] Patient Account Number: [id number] Date of Birth/Sex: Jan 25, 1946 (75 y.o. F) Treating RN: Carlene Coria Primary Care Nairi Oswald: [name] Other Clinician: Referring Magnus Crescenzo: [name] Treating Cailah Reach/Extender: Skipper Cliche in Treatment: 8 Active Inactive Wound/Skin Impairment Nursing Diagnoses: Impaired tissue integrity Goals: Patient/caregiver will verbalize understanding of skin care regimen Date Initiated: 01/16/2021 Date Inactivated: 02/18/2021 Target Resolution Date: 02/16/2021 Goal Status: Met Ulcer/skin breakdown  will have a volume reduction of 30% by week 4 Date Initiated: 01/16/2021 Date Inactivated: 02/18/2021 Target Resolution Date: 02/16/2021 Goal Status: Met Ulcer/skin breakdown will have a volume reduction of 50% by week 8 Date Initiated: 01/16/2021 Target Resolution Date: 03/18/2021 Goal Status: Active Interventions: Assess ulceration(s) every visit Treatment Activities: Topical wound management initiated : 01/16/2021 Notes: Electronic Signature(s) Signed: 03/18/2021 7:11:39 PM By: Carlene Coria RN Entered By: Carlene Coria on 03/18/2021 14:39:18 [name] ([id number]) -------------------------------------------------------------------------------- Pain Assessment Details Patient Name: [name] Date of Service: 03/18/2021 2:15 PM Medical Record Number: [id number] Patient Account Number: [id number] Date of Birth/Sex: 11-May-1946 (75 y.o. F) Treating RN: Carlene Coria Primary Care Lancelot Alyea: [name] Other Clinician: Referring Truxton Stupka: [name] Treating Jacqueline Spofford/Extender: Skipper Cliche in Treatment: 8 Active Problems Location of Pain Severity and Description of Pain Patient Has Paino No Site Locations Pain Management and Medication Current Pain Management: Electronic Signature(s) Signed: 03/18/2021 7:11:39 PM By: Carlene Coria RN Entered By: Carlene Coria on 03/18/2021 14:23:27 [name] ([id number]) -------------------------------------------------------------------------------- Patient/Caregiver Education Details Patient Name: [name] Date of Service: 03/18/2021 2:15 PM Medical Record Number: [id number] Patient Account Number: [id number] Date of Birth/Gender: October 29, 1945 (75 y.o. F) Treating RN: Carlene Coria Primary Care Physician: [name] Other Clinician: Referring Physician: [name] Treating Physician/Extender: Skipper Cliche in Treatment: 8 Education Assessment Education Provided To: Patient Education Topics  Provided Wound/Skin Impairment: Methods: Explain/Verbal Responses: State content correctly Electronic Signature(s) Signed: 03/18/2021 7:11:39 PM By: Carlene Coria RN Entered By: Carlene Coria on 03/18/2021 14:42:18 [name] ([id number]) -------------------------------------------------------------------------------- Wound Assessment Details Patient Name: [name] Date of Service: 03/18/2021 2:15 PM Medical Record Number: [id number] Patient Account Number: [id number] Date of Birth/Sex: 1945-10-28 (75 y.o. F) Treating RN: Carlene Coria Primary Care Makyia Erxleben: [name] Other Clinician: Referring Tasha Jindra: [name] Treating Elesa Garman/Extender: Skipper Cliche in Treatment: 8 Wound Status Wound Number: 2 Primary Venous Leg Ulcer Etiology: Wound Location: Left, Lateral Lower Leg Wound Open Wounding Event: Gradually Appeared Status: Date Acquired: 09/08/2020 Comorbid Cataracts, Lymphedema, Sleep Apnea, Congestive Heart Weeks Of Treatment: 8 History: Failure, Hypertension, Osteoarthritis, Neuropathy, Clustered Wound: No Received Chemotherapy, Received Radiation Photos Wound Measurements Length: (cm) 2.5 Width: (cm) 3 Depth: (cm) 0.2 Area: (cm) 5.89 Volume: (cm) 1.178 % Reduction in Area: -25% % Reduction in Volume: -150.1% Epithelialization: Medium (34-66%) Tunneling: No Undermining: No Wound Description Classification: Full Thickness Without Exposed Support Structu Exudate Amount: Medium Exudate Type: Serosanguineous Exudate Color: red, brown res Foul Odor After Cleansing: No Slough/Fibrino Yes Wound Bed Granulation Amount: Medium (34-66%) Exposed Structure Granulation Quality: Red Fascia Exposed: No Necrotic Amount: Medium (34-66%) Fat Layer (Subcutaneous Tissue) Exposed: Yes Necrotic Quality: Adherent Slough Tendon Exposed: No Muscle Exposed: No Joint Exposed: No Bone Exposed: No Treatment Notes Wound #2 (Lower Leg) Wound Laterality: Left,  Lateral Cleanser Soap and Water Discharge Instruction: Gently cleanse wound with  antibacterial soap, rinse and pat dry prior to dressing wounds Peri-Wound Care AMALI, [facility] ([id number]) Topical Triamcinolone Acetonide Cream, 0.1%, 15 (g) tube Discharge Instruction: Apply as directed by Petros Ahart. Primary Dressing Silvercel Small 2x2 (in/in) Discharge Instruction: Apply Silvercel Small 2x2 (in/in) as instructed Secondary Dressing Xtrasorb Medium 4x5 (in/in) Discharge Instruction: Apply to wound as directed. Do not cut. Secured With Compression Wrap Profore Lite LF 3 Multilayer Compression Bandaging System Discharge Instruction: Apply 3 multi-layer wrap as prescribed. Compression Stockings Add-Ons Electronic Signature(s) Signed: 03/18/2021 7:11:39 PM By: Carlene Coria RN Entered By: Carlene Coria on 03/18/2021 14:30:15 [name] ([id number]) -------------------------------------------------------------------------------- Vitals Details Patient Name: [name] Date of Service: 03/18/2021 2:15 PM Medical Record Number: [id number] Patient Account Number: [id number] Date of Birth/Sex: 1946-08-02 (75 y.o. F) Treating RN: Carlene Coria Primary Care Dorsie Sethi: [name] Other Clinician: Referring Cordale Manera: [name] Treating Kaydyn Sayas/Extender: Skipper Cliche in Treatment: 8 Vital Signs Time Taken: 14:22 Temperature (F): 98.7 Height (in): 69 Pulse (bpm): 67 Weight (lbs): 244 Respiratory Rate (breaths/min): 18 Body Mass Index (BMI): 36 Blood Pressure (mmHg): 168/78 Reference Range: 80 - 120 mg / dl Electronic Signature(s) Signed: 03/18/2021 7:11:39 PM By: Carlene Coria RN Entered By: Carlene Coria on 03/18/2021 14:23:15

## 2021-03-18 NOTE — Progress Notes (Addendum)
DAILYN, [name] ([id number]) Visit Report for 03/18/2021 Chief Complaint Document Details Patient Name: [name], [name] Date of Service: 03/18/2021 2:15 PM Medical Record Number: [id number] Patient Account Number: [id number] Date of Birth/Sex: July 27, 1946 (75 y.o. F) Treating RN: Primary Care Provider: [name] Other Clinician: Referring Provider: [name] Treating Provider/Extender: Skipper Cliche in Treatment: 8 Information Obtained from: Patient Chief Complaint Left LE Ulcer Electronic Signature(s) Signed: 03/18/2021 2:16:39 PM By: Worthy Keeler PA-C Entered By: Worthy Keeler on 03/18/2021 14:16:39 [name] ([id number]) -------------------------------------------------------------------------------- Debridement Details Patient Name: [name] Date of Service: 03/18/2021 2:15 PM Medical Record Number: [id number] Patient Account Number: [id number] Date of Birth/Sex: 13-Apr-1946 (75 y.o. F) Treating RN: Carlene Coria Primary Care Provider: [name] Other Clinician: Referring Provider: [name] Treating Provider/Extender: Skipper Cliche in Treatment: 8 Debridement Performed for Wound #2 Left,Lateral Lower Leg Assessment: Performed By: Physician Tommie Sams., PA-C Debridement Type: Debridement Severity of Tissue Pre Debridement: Fat layer exposed Level of Consciousness (Pre- Awake and Alert procedure): Pre-procedure Verification/Time Out Yes - 14:39 Taken: Start Time: 14:39 Pain Control: Lidocaine 4% Topical Solution Total Area Debrided (L x W): 2.5 (cm) x 3 (cm) = 7.5 (cm) Tissue and other material Viable, Non-Viable, Slough, Subcutaneous, Skin: Dermis , Skin: Epidermis, Biofilm, Slough debrided: Level: Skin/Subcutaneous Tissue Debridement Description: Excisional Instrument: Curette Bleeding: Minimum Hemostasis Achieved: Pressure End Time: 14:41 Procedural Pain: 0 Post Procedural Pain: 0 Response to Treatment: Procedure was  tolerated well Level of Consciousness (Post- Awake and Alert procedure): Post Debridement Measurements of Total Wound Length: (cm) 2.5 Width: (cm) 3 Depth: (cm) 0.2 Volume: (cm) 1.178 Character of Wound/Ulcer Post Debridement: Improved Severity of Tissue Post Debridement: Fat layer exposed Post Procedure Diagnosis Same as Pre-procedure Electronic Signature(s) Signed: 03/18/2021 3:56:57 PM By: Worthy Keeler PA-C Signed: 03/18/2021 7:11:39 PM By: Carlene Coria RN Entered By: Carlene Coria on 03/18/2021 14:40:50 [name] ([id number]) -------------------------------------------------------------------------------- HPI Details Patient Name: [name] Date of Service: 03/18/2021 2:15 PM Medical Record Number: [id number] Patient Account Number: [id number] Date of Birth/Sex: December 28, 1945 (75 y.o. F) Treating RN: Primary Care Provider: [name] Other Clinician: Referring Provider: [name] Treating Provider/Extender: Skipper Cliche in Treatment: 8 History of Present Illness HPI Description: 03/15/2020 upon evaluation today patient presents today for initial evaluation here in our clinic concerning a wound that is on the left posterior lower extremity. Unfortunately this has been giving the patient some discomfort at this point which she notes has been affected her pretty much [name]. The patient does have a history of venous insufficiency, hypertension, congestive heart failure, and a history of breast cancer. Fortunately there does not appear to be evidence of active infection at this time which is great news. No fevers, chills, nausea, vomiting, or diarrhea. Wound is not extremely large but does have some slough covering the surface of the wound. This is going require sharp debridement today. 03/15/2020 upon evaluation today patient appears to be doing fairly well in regard to her wound. She does have some slough noted on the surface of the wound currently but I do  believe the Iodoflex has been beneficial over the past week. There is no signs of active infection at this time which is great news and overall very pleased with the progress. No fevers, chills, nausea, vomiting, or diarrhea. 04/02/2020 upon evaluation today patient appears to be doing a little better in regard to her wound size wise this is not tremendously smaller she does have some slough buildup on the surface of  the wound. With that being said this is going require some sharp debridement today to clear away some of the necrotic debris. Fortunately there is no evidence of active infection at this time. No fevers, chills, nausea, vomiting, or diarrhea. 04/10/2020 on evaluation today patient appears to be doing well with regard to her ulcer which is measuring somewhat smaller today. She actually has more granulation tissue noted which is also good news is no need for sharp debridement today. Fortunately there is no evidence of infection either which is also excellent. No fevers, chills, nausea, vomiting, or diarrhea. 04/26/2020 on evaluation today patient's wound actually is showing signs of good improvement which is great news. There does not appear to be any evidence of active infection. I do believe she is tolerating the collagen at this point. 05/03/2020 upon evaluation today patient's wound actually showed signs of good granulation at this time there does not appear to be any evidence of active infection which is great news and overall very pleased with where things stand. With that being said I do believe that the patient is can require some sharp debridement today but fortunately nothing too significant. 05/11/20 on evaluation today patient appears to be doing well in regard to her leg ulcer. She's been tolerating the dressing changes without complication. Fortunately there is no signs of active infection at this time. Overall I'm very pleased with where things stand. 05/18/2020 upon evaluation today  patient's wound is showing signs of improvement is very dry and I think the collagen for the most part just dried to the wound bed. I am going to clean this away with sharp debridement in order to get this under control in my opinion. 05/25/2020 upon evaluation today patient actually appears to be doing well in regard to her leg ulcer. In fact upon inspection it appears she could potentially be completely healed but that is not guaranteed based on what we are seeing. There does not appear to be any signs of active infection which is great news. 05/31/2020 on evaluation today patient appears to be doing well with regard to her wounds. She in fact appears to be almost completely healed on the left leg there is just a very small opening remaining point 1 06/08/2020 upon evaluation today patient appears to be doing excellent in regard to her leg ulcer on the left in fact it appears to be that she is completely healed as of today. I see no signs of active infection at this time which is great news. No fevers, chills, nausea, vomiting, or diarrhea. READMISSION 01/16/2021 Patient that we have had in this clinic with a wound on the left posterior calf in the summer 2021 extending into October. She was felt to have chronic venous insufficiency. Per the patient's description she was discharged with what sounds like external compression stockings although she could not afford the "$75 per leg". She therefore did not get anything. Apparently her wound that she has currently is been in existence since January. She has been followed at vein and vascular with Unna boots and I think calcium alginate. She had ABIs done on 06/25/2020 that showed an noncompressible ABI on the right leg and the left leg but triphasic waveforms with good great toe pressures and a TBI of 0.90 on the right and 0.91 on the left Surprisingly the patient also had venous reflux studies that were really unremarkable. This included no evidence of  DVTs or SVTs but there was no evidence of deep venous insufficiency or  superficial venous insufficiency in the greater saphenous or short saphenous veins bilaterally. I would wonder about more central venous issues or perhaps this is all lymphedema 01/25/2021 upon evaluation today patient appears to be doing decently well and guard to her wound. The good news is the Iodoflex that is job she saw Dr. Dellia Nims last week for readmission and to be honest I think that this has done extremely well over that week. I do believe that she can tolerate the sharp debridement today which will be good. I think that cleaning off the wound will likely allow [place] to be able to use a different type of dressing I do not think the Iodoflex will even be necessary based on how clean the wound looks. Fortunately there is no sign of active infection at this time which is great news. No fevers, chills, nausea, vomiting, or diarrhea. 02/12/2021 upon evaluation today patient appears to be doing well with regard to her leg ulcer. We did switch to alginate when she came for nurse visit I think is doing much better for her. Fortunately there does not appear to be any signs of active infection at this time which is great news. No fevers, chills, nausea, vomiting, or diarrhea. TAMAIYA, BUMP ([id number]) 02/18/2021 upon evaluation today patient's wound is actually showing signs of good improvement. I am very pleased with where things stand currently. I do not see any signs of active infection which is great news and overall I feel like she is making good progress. The patient likewise is happy that things are doing so well and that this is measuring somewhat smaller. 02/25/2021 upon evaluation today patient actually appears to be doing quite well in regard to her wounds. She has been tolerating the dressing changes without complication. Fortunately there does not appear to be any signs of active infection which is great news and overall I  am extremely pleased with where things stand. No fevers, chills, nausea, vomiting, or diarrhea. She does have a lot of edema I really do think she needs compression socks to be worn in order to prevent things from causing additional issues for her currently. Especially on the right leg she should be wearing compression socks all the time to be honest. 03/04/2021 upon evaluation today patient's leg ulcer actually appears to be doing pretty well which is great news. There does not appear to be any significant change but overall the appearance is better even though the size is not necessarily reflecting a great improvement. Fortunately there does not appear to be any signs of active infection. No fevers, chills, nausea, vomiting, or diarrhea. 03/12/2021 upon evaluation today patient appears to be doing well with regard to her wounds. She has been tolerating the dressing changes without complication. The good news is that the wound on her left lateral leg is doing much better and is showing signs of good improvement. Overall her swelling is controlled well as well. She does need some compression socks she plans to go Jefferey socks next week. 03/18/2021 on evaluation today patient's wound actually is showing signs of good improvement. She does have a little bit of slough this can require some sharp debridement today. Electronic Signature(s) Signed: 03/18/2021 2:43:15 PM By: Worthy Keeler PA-C Entered By: Worthy Keeler on 03/18/2021 14:43:15 [name] ([id number]) -------------------------------------------------------------------------------- Physical Exam Details Patient Name: [name] Date of Service: 03/18/2021 2:15 PM Medical Record Number: [id number] Patient Account Number: [id number] Date of Birth/Sex: February 21, 1946 (75 y.o. F) Treating RN: Primary Care Provider: Clide Deutscher,  Ngwe Other Clinician: Referring Provider: [name] Treating Provider/Extender: Skipper Cliche in  Treatment: 8 Constitutional Well-nourished and well-hydrated in no acute distress. Respiratory normal breathing without difficulty. Psychiatric this patient is able to make decisions and demonstrates good insight into disease process. Alert and Oriented x 3. pleasant and cooperative. Notes I did perform sharp debridement clear away some of the slough on the surface of the wound and she tolerated that today without complication. Post debridement wound bed appears to be doing significantly better. Patient's wound bed showed signs of good granulation epithelization at this point. I do not see any evidence of infection. Electronic Signature(s) Signed: 03/18/2021 2:43:44 PM By: Worthy Keeler PA-C Entered By: Worthy Keeler on 03/18/2021 14:43:44 [name] ([id number]) -------------------------------------------------------------------------------- Physician Orders Details Patient Name: [name] Date of Service: 03/18/2021 2:15 PM Medical Record Number: [id number] Patient Account Number: [id number] Date of Birth/Sex: 10-03-1945 (75 y.o. 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 Bathing/ Shower/ Hygiene o May shower with wound dressing protected with water repellent cover or cast protector. Edema Control - Lymphedema / Segmental Compressive Device / Other Left Lower Extremity o Optional: One layer of unna paste to top of compression wrap (to act as an anchor). o  Tubigrip single layer applied. - RIGHT LEG tubi grip applied "E" until you get your compression sock use this and it's hand washable o Patient to wear own compression stockings. Remove compression stockings every night before going to bed and put on every morning when getting up. - on RIGHT LEG call [facility] for compression sock 15-22 mm/Hg, measurements and phone number on blue sheet [employer] on [employer] in [place] also has the compression socks o Elevate, Exercise [name] and Avoid Standing for Long Periods of Time. o Elevate legs to the level of the heart and pump ankles as often as possible o Elevate leg(s) parallel to the floor when sitting. o Compression Pump: Use compression pump on left lower extremity for 60 minutes, twice [name]. - You may pump over wraps o Compression Pump: Use compression pump on right lower extremity for 60 minutes, twice [name]. - You may pump over wraps Additional Orders / Instructions Wound #2 Left,Lateral Lower Leg o Follow Nutritious Diet and Increase Protein Intake - Increase protein intake. Wound Treatment Wound #2 - Lower Leg Wound Laterality: Left, Lateral Cleanser: Soap and Water 1 x Per Week/30 Days Discharge Instructions: Gently cleanse wound with antibacterial soap, rinse and pat dry prior to dressing wounds Topical: Triamcinolone Acetonide Cream, 0.1%, 15 (g) tube 1 x Per Week/30 Days Discharge Instructions: Apply as directed by provider. Primary Dressing: Silvercel Small 2x2 (in/in) 1 x Per Week/30 Days Discharge Instructions: Apply Silvercel Small 2x2 (in/in) as instructed Secondary Dressing: Xtrasorb Medium 4x5 (in/in) 1 x Per Week/30 Days Discharge Instructions: Apply to wound as directed. Do not cut. Compression Wrap: Profore Lite LF 3 Multilayer Compression Bandaging System 1 x Per Week/30 Days Discharge Instructions: Apply 3 multi-layer wrap as prescribed. Electronic Signature(s) Signed: 03/18/2021 3:56:57 PM  By: Worthy Keeler PA-C Signed: 03/18/2021 7:11:39 PM By: Carlene Coria RN  Entered By: Carlene Coria on 03/18/2021 14:41:55 [name] ([id number]) Wynetta Emery, [name] ([id number]) -------------------------------------------------------------------------------- Problem List Details Patient Name: [name] Date of Service: 03/18/2021 2:15 PM Medical Record Number: [id number] Patient Account Number: [id number] Date of Birth/Sex: 1945-10-03 (75 y.o. F) Treating RN: Primary Care Provider: [name] Other Clinician: Referring Provider: [name] Treating Provider/Extender: Skipper Cliche in Treatment: 8 Active Problems ICD-10 Encounter Code Description Active Date MDM Diagnosis I89.0 Lymphedema, not elsewhere classified 01/16/2021 No Yes I87.332 Chronic venous hypertension (idiopathic) with ulcer and inflammation of 01/16/2021 No Yes left lower extremity L97.828 Non-pressure chronic ulcer of other part of left lower leg with other 01/16/2021 No Yes specified severity Inactive Problems Resolved Problems Electronic Signature(s) Signed: 03/18/2021 2:16:34 PM By: Worthy Keeler PA-C Entered By: Worthy Keeler on 03/18/2021 14:16:34 [name] ([id number]) -------------------------------------------------------------------------------- Progress Note Details Patient Name: [name] Date of Service: 03/18/2021 2:15 PM Medical Record Number: [id number] Patient Account Number: [id number] Date of Birth/Sex: [birth year] (75 y.o. F) Treating RN: Primary Care Provider: [name] Other Clinician: Referring Provider: [name] Treating Provider/Extender: Skipper Cliche in Treatment: 8 Subjective Chief Complaint Information obtained from Patient Left LE Ulcer History of Present Illness (HPI) 03/15/2020 upon evaluation today patient presents today for initial evaluation here in our clinic concerning a wound that is on the left posterior lower extremity.  Unfortunately this has been giving the patient some discomfort at this point which she notes has been affected her pretty much [name]. The patient does have a history of venous insufficiency, hypertension, congestive heart failure, and a history of breast cancer. Fortunately there does not appear to be evidence of active infection at this time which is great news. No fevers, chills, nausea, vomiting, or diarrhea. Wound is not extremely large but does have some slough covering the surface of the wound. This is going require sharp debridement today. 03/15/2020 upon evaluation today patient appears to be doing fairly well in regard to her wound. She does have some slough noted on the surface of the wound currently but I do believe the Iodoflex has been beneficial over the past week. There is no signs of active infection at this time which is great news and overall very pleased with the progress. No fevers, chills, nausea, vomiting, or diarrhea. 04/02/2020 upon evaluation today patient appears to be doing a little better in regard to her wound size wise this is not tremendously smaller she does have some slough buildup on the surface of the wound. With that being said this is going require some sharp debridement today to clear away some of the necrotic debris. Fortunately there is no evidence of active infection at this time. No fevers, chills, nausea, vomiting, or diarrhea. 04/10/2020 on evaluation today patient appears to be doing well with regard to her ulcer which is measuring somewhat smaller today. She actually has more granulation tissue noted which is also good news is no need for sharp debridement today. Fortunately there is no evidence of infection either which is also excellent. No fevers, chills, nausea, vomiting, or diarrhea. 04/26/2020 on evaluation today patient's wound actually is showing signs of good improvement which is great news. There does not appear to be any evidence of active infection. I  do believe she is tolerating the collagen at this point. 05/03/2020 upon evaluation today patient's wound actually showed signs of good granulation at this time there does not appear to be any evidence of active infection which is great news and overall very pleased with where  things stand. With that being said I do believe that the patient is can require some sharp debridement today but fortunately nothing too significant. 05/11/20 on evaluation today patient appears to be doing well in regard to her leg ulcer. She's been tolerating the dressing changes without complication. Fortunately there is no signs of active infection at this time. Overall I'm very pleased with where things stand. 05/18/2020 upon evaluation today patient's wound is showing signs of improvement is very dry and I think the collagen for the most part just dried to the wound bed. I am going to clean this away with sharp debridement in order to get this under control in my opinion. 05/25/2020 upon evaluation today patient actually appears to be doing well in regard to her leg ulcer. In fact upon inspection it appears she could potentially be completely healed but that is not guaranteed based on what we are seeing. There does not appear to be any signs of active infection which is great news. 05/31/2020 on evaluation today patient appears to be doing well with regard to her wounds. She in fact appears to be almost completely healed on the left leg there is just a very small opening remaining point 1 06/08/2020 upon evaluation today patient appears to be doing excellent in regard to her leg ulcer on the left in fact it appears to be that she is completely healed as of today. I see no signs of active infection at this time which is great news. No fevers, chills, nausea, vomiting, or diarrhea. READMISSION 01/16/2021 Patient that we have had in this clinic with a wound on the left posterior calf in the summer 2021 extending into October. She  was felt to have chronic venous insufficiency. Per the patient's description she was discharged with what sounds like external compression stockings although she could not afford the "$75 per leg". She therefore did not get anything. Apparently her wound that she has currently is been in existence since January. She has been followed at vein and vascular with Unna boots and I think calcium alginate. She had ABIs done on 06/25/2020 that showed an noncompressible ABI on the right leg and the left leg but triphasic waveforms with good great toe pressures and a TBI of 0.90 on the right and 0.91 on the left Surprisingly the patient also had venous reflux studies that were really unremarkable. This included no evidence of DVTs or SVTs but there was no evidence of deep venous insufficiency or superficial venous insufficiency in the greater saphenous or short saphenous veins bilaterally. I would wonder about more central venous issues or perhaps this is all lymphedema 01/25/2021 upon evaluation today patient appears to be doing decently well and guard to her wound. The good news is the Iodoflex that is job she saw Dr. Dellia Nims last week for readmission and to be honest I think that this has done extremely well over that week. I do believe that she can tolerate the sharp debridement today which will be good. I think that cleaning off the wound will likely allow [place] to be able to use a different type of dressing I do not think the Iodoflex will even be necessary based on how clean the wound looks. Fortunately there is no sign of active infection at this time which is great news. No fevers, chills, nausea, vomiting, or diarrhea. RIANA, TESSMER ([id number]) 02/12/2021 upon evaluation today patient appears to be doing well with regard to her leg ulcer. We did switch to alginate when she  came for nurse visit I think is doing much better for her. Fortunately there does not appear to be any signs of active infection at  this time which is great news. No fevers, chills, nausea, vomiting, or diarrhea. 02/18/2021 upon evaluation today patient's wound is actually showing signs of good improvement. I am very pleased with where things stand currently. I do not see any signs of active infection which is great news and overall I feel like she is making good progress. The patient likewise is happy that things are doing so well and that this is measuring somewhat smaller. 02/25/2021 upon evaluation today patient actually appears to be doing quite well in regard to her wounds. She has been tolerating the dressing changes without complication. Fortunately there does not appear to be any signs of active infection which is great news and overall I am extremely pleased with where things stand. No fevers, chills, nausea, vomiting, or diarrhea. She does have a lot of edema I really do think she needs compression socks to be worn in order to prevent things from causing additional issues for her currently. Especially on the right leg she should be wearing compression socks all the time to be honest. 03/04/2021 upon evaluation today patient's leg ulcer actually appears to be doing pretty well which is great news. There does not appear to be any significant change but overall the appearance is better even though the size is not necessarily reflecting a great improvement. Fortunately there does not appear to be any signs of active infection. No fevers, chills, nausea, vomiting, or diarrhea. 03/12/2021 upon evaluation today patient appears to be doing well with regard to her wounds. She has been tolerating the dressing changes without complication. The good news is that the wound on her left lateral leg is doing much better and is showing signs of good improvement. Overall her swelling is controlled well as well. She does need some compression socks she plans to go Jefferey socks next week. 03/18/2021 on evaluation today patient's wound  actually is showing signs of good improvement. She does have a little bit of slough this can require some sharp debridement today. Objective Constitutional Well-nourished and well-hydrated in no acute distress. Vitals Time Taken: 2:22 PM, Height: 69 in, Weight: 244 lbs, BMI: 36, Temperature: 98.7 F, Pulse: 67 bpm, Respiratory Rate: 18 breaths/min, Blood Pressure: 168/78 mmHg. Respiratory normal breathing without difficulty. Psychiatric this patient is able to make decisions and demonstrates good insight into disease process. Alert and Oriented x 3. pleasant and cooperative. General Notes: I did perform sharp debridement clear away some of the slough on the surface of the wound and she tolerated that today without complication. Post debridement wound bed appears to be doing significantly better. Patient's wound bed showed signs of good granulation epithelization at this point. I do not see any evidence of infection. Integumentary (Hair, Skin) Wound #2 status is Open. Original cause of wound was Gradually Appeared. The date acquired was: 09/08/2020. The wound has been in treatment 8 weeks. The wound is located on the Left,Lateral Lower Leg. The wound measures 2.5cm length x 3cm width x 0.2cm depth; 5.89cm^2 area and 1.178cm^3 volume. There is Fat Layer (Subcutaneous Tissue) exposed. There is no tunneling or undermining noted. There is a medium amount of serosanguineous drainage noted. There is medium (34-66%) red granulation within the wound bed. There is a medium (34-66%) amount of necrotic tissue within the wound bed including Adherent Slough. 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 There was a Excisional Skin/Subcutaneous Tissue Debridement with a total area of 7.5 sq cm performed by Tommie Sams., PA-C. With the following instrument(s): Curette to remove Viable and Non-Viable tissue/material. Material removed includes Subcutaneous Tissue, Slough, Skin: Dermis, Skin: Epidermis, and Biofilm after achieving pain control using Lidocaine 4% Topical Solution. No specimens were taken. A time out was conducted at 14:39, prior to the start of the procedure. A Minimum amount of bleeding was controlled with Pressure. The procedure was tolerated well with a pain level of 0 throughout and a pain level of 0 following the procedure. Post Debridement Measurements: 2.5cm length x 3cm width x 0.2cm depth; 1.178cm^3 volume. Character of Wound/Ulcer Post Debridement is improved. Severity of Tissue Post Debridement is: Fat layer exposed. Post procedure Diagnosis Wound #2: Same as Pre-Procedure Pre-procedure diagnosis of Wound #2 is a Venous Leg Ulcer located on the Left,Lateral Lower Leg . There was a Three Layer Compression Therapy Procedure by Carlene Coria, RN. Post procedure Diagnosis Wound #2: Same as Pre-Procedure Plan Follow-up Appointments: Return Appointment in 1 week. Bathing/ Shower/ Hygiene: May shower with wound dressing protected with water repellent cover or cast protector. Edema Control - Lymphedema / Segmental Compressive Device / Other: Optional: One layer of unna paste to top of compression wrap (to act as an anchor). Tubigrip single layer applied. - RIGHT LEG tubi grip applied "E" until you get your compression sock use this and it's hand washable Patient to wear own compression stockings. Remove compression stockings every night before going to bed and put on every morning when getting up. - on RIGHT LEG call [facility] for compression sock 15-22 mm/Hg,  measurements and phone number on blue sheet [employer] on [employer] in [place] also has the compression socks Elevate, Exercise [name] and Avoid Standing for Long Periods of Time. Elevate legs to the level of the heart and pump ankles as often as possible Elevate leg(s) parallel to the floor when sitting. Compression Pump: Use compression pump on left lower extremity for 60 minutes, twice [name]. - You may pump over wraps Compression Pump: Use compression pump on right lower extremity for 60 minutes, twice [name]. - You may pump over wraps Additional Orders / Instructions: Wound #2 Left,Lateral Lower Leg: Follow Nutritious Diet and Increase Protein Intake - Increase protein intake. WOUND #2: - Lower Leg Wound Laterality: Left, Lateral Cleanser: Soap and Water 1 x Per Week/30 Days Discharge Instructions: Gently cleanse wound with antibacterial soap, rinse and pat dry prior to dressing wounds Topical: Triamcinolone Acetonide Cream, 0.1%, 15 (g) tube 1 x Per Week/30 Days Discharge Instructions: Apply as directed by provider. Primary Dressing: Silvercel Small 2x2 (in/in) 1 x Per Week/30 Days Discharge Instructions: Apply Silvercel Small 2x2 (in/in) as instructed Secondary Dressing: Xtrasorb Medium 4x5 (in/in) 1 x Per Week/30 Days Discharge Instructions: Apply to wound as directed. Do not cut. Compression Wrap: Profore Lite LF 3 Multilayer Compression Bandaging System 1 x Per Week/30 Days Discharge Instructions: Apply 3 multi-layer wrap as prescribed. 1. Would recommend that we going continue with wound care measures as before and the patient is in agreement with the plan. I do not see any evidence  of active infection at this time which is great and overall I think that the triamcinolone followed by silver alginate has done well for her. 2. I am also can recommend to continue with XtraSorb which I feel like is doing the job as well. With. Also continue with the 3 layer wrap. We will see  patient back for reevaluation in 1 week here in the clinic. If anything worsens or changes patient will contact our office for additional recommendations. Electronic Signature(s) Signed: 03/18/2021 2:44:20 PM By: Worthy Keeler PA-C Entered By: Worthy Keeler on 03/18/2021 14:44:19 [name] ([id number]) -------------------------------------------------------------------------------- SuperBill Details Patient Name: [name] Date of Service: 03/18/2021 Medical Record Number: [id number] Patient Account Number: [id number] Date of Birth/Sex: 1945-11-16 (75 y.o. F) Treating RN: Primary Care Provider: [name] Other Clinician: Referring Provider: [name] Treating Provider/Extender: Skipper Cliche in Treatment: 8 Diagnosis Coding ICD-10 Codes Code Description I89.0 Lymphedema, not elsewhere classified I87.332 Chronic venous hypertension (idiopathic) with ulcer and inflammation of left lower extremity L97.828 Non-pressure chronic ulcer of other part of left lower leg with other specified severity Facility Procedures CPT4 Code: 03491791 Description: 50569 - DEB SUBQ TISSUE 20 SQ CM/< Modifier: Quantity: 1 CPT4 Code: Description: ICD-10 Diagnosis Description L97.828 Non-pressure chronic ulcer of other part of left lower leg with other spec Modifier: ified severity Quantity: Physician Procedures CPT4 Code: 7948016 Description: 55374 - WC PHYS SUBQ TISS 20 SQ CM Modifier: Quantity: 1 CPT4 Code: Description: ICD-10 Diagnosis Description L97.828 Non-pressure chronic ulcer of other part of left lower leg with other spec Modifier: ified severity Quantity: Electronic Signature(s) Signed: 03/18/2021 2:44:28 PM By: Worthy Keeler PA-C Entered By: Worthy Keeler on 03/18/2021 14:44:27

## 2021-03-21 ENCOUNTER — Other Ambulatory Visit: Payer: Self-pay | Admitting: Oncology

## 2021-03-25 ENCOUNTER — Other Ambulatory Visit: Payer: Self-pay

## 2021-03-25 ENCOUNTER — Encounter: Payer: Medicare Other | Admitting: Physician Assistant

## 2021-03-25 DIAGNOSIS — I890 Lymphedema, not elsewhere classified: Secondary | ICD-10-CM | POA: Diagnosis not present

## 2021-03-25 NOTE — Progress Notes (Signed)
DUCHESS, ARMENDAREZ ([id number]) Visit Report for 03/25/2021 Arrival Information Details Patient Name: [name], [name] Date of Service: 03/25/2021 10:00 AM Medical Record Number: [id number] Patient Account Number: [id number] Date of Birth/Sex: 1946/07/06 (75 y.o. F) Treating RN: Carlene Coria Primary Care Jaymason Ledesma: [name] Other Clinician: Referring Yuepheng Schaller: [name] Treating Cledith Kamiya/Extender: Skipper Cliche in Treatment: 9 Visit Information History Since Last Visit All ordered tests and consults were completed: No Patient Arrived: [name] Added or deleted any medications: No Arrival Time: 09:58 Any new allergies or adverse reactions: No Accompanied By: self Had a fall or experienced change in No Transfer Assistance: None activities of daily living that may affect Patient Identification Verified: Yes risk of falls: Secondary Verification Process Completed: Yes Signs or symptoms of abuse/neglect since last visito No Patient Requires Transmission-Based Precautions: No Hospitalized since last visit: No Patient Has Alerts: Yes Implantable device outside of the clinic excluding No Patient Alerts: NOT DIABETIC cellular tissue based products placed in the center since last visit: Has Dressing in Place as Prescribed: Yes Has Compression in Place as Prescribed: Yes Pain Present Now: No Electronic Signature(s) Signed: 03/25/2021 2:11:15 PM By: Carlene Coria RN Entered By: Carlene Coria on 03/25/2021 10:02:11 [name] ([id number]) -------------------------------------------------------------------------------- Clinic Level of Care Assessment Details Patient Name: [name] Date of Service: 03/25/2021 10:00 AM Medical Record Number: [id number] Patient Account Number: [id number] Date of Birth/Sex: Feb 15, 1946 (75 y.o. F) Treating RN: Carlene Coria Primary Care Nautica Hotz: [name] Other Clinician: Referring Frankye Schwegel: [name] Treating Jaken Fregia/Extender: Skipper Cliche in Treatment: 9 Clinic Level of Care Assessment Items TOOL 1 Quantity Score []  - Use when EandM and Procedure is performed on INITIAL visit 0 ASSESSMENTS - Nursing Assessment / Reassessment []  - General Physical Exam (combine w/ comprehensive assessment (listed just below) when performed on new 0 pt. evals) []  - 0 Comprehensive Assessment (HX, ROS, Risk Assessments, Wounds Hx, etc.) ASSESSMENTS - Wound and Skin Assessment / Reassessment []  - Dermatologic / Skin Assessment (not related to wound area) 0 ASSESSMENTS - Ostomy and/or Continence Assessment and Care []  - Incontinence Assessment and Management 0 []  - 0 Ostomy Care Assessment and Management (repouching, etc.) PROCESS - Coordination of Care []  - Simple Patient / Family Education for ongoing care 0 []  - 0 Complex (extensive) Patient / Family Education for ongoing care []  - 0 Staff obtains Programmer, systems, Records, Test Results / Process Orders []  - 0 Staff telephones HHA, Nursing Homes / Clarify orders / etc []  - 0 Routine Transfer to another Facility (non-emergent condition) []  - 0 Routine Hospital Admission (non-emergent condition) []  - 0 New Admissions / Biomedical engineer / Ordering NPWT, Apligraf, etc. []  - 0 Emergency Hospital Admission (emergent condition) PROCESS - Special Needs []  - Pediatric / Minor Patient Management 0 []  - 0 Isolation Patient Management []  - 0 Hearing / Language / Visual special needs []  - 0 Assessment of Community assistance (transportation, D/C planning, etc.) []  - 0 Additional assistance / Altered mentation []  - 0 Support Surface(s) Assessment (bed, cushion, seat, etc.) INTERVENTIONS - Miscellaneous []  - External ear exam 0 []  - 0 Patient Transfer (multiple staff / Civil Service fast streamer / Similar devices) []  - 0 Simple Staple / Suture removal (25 or less) []  - 0 Complex Staple / Suture removal (26 or more) []  - 0 Hypo/Hyperglycemic Management (do not check if billed  separately) []  - 0 Ankle / Brachial Index (ABI) - do not check if billed separately Has the patient been seen at the hospital within the last  three years: Yes Total Score: 0 Level Of Care: ____ [name] ([id number]) Electronic Signature(s) Signed: 03/25/2021 2:11:15 PM By: Carlene Coria RN Entered By: Carlene Coria on 03/25/2021 10:28:01 [name] ([id number]) -------------------------------------------------------------------------------- Compression Therapy Details Patient Name: [name] Date of Service: 03/25/2021 10:00 AM Medical Record Number: [id number] Patient Account Number: [id number] Date of Birth/Sex: December 25, 1945 (75 y.o. F) Treating RN: Carlene Coria Primary Care Janneth Krasner: [name] Other Clinician: Referring Antwuan Eckley: [name] Treating Champayne Kocian/Extender: Skipper Cliche in Treatment: 9 Compression Therapy Performed for Wound Assessment: Wound #2 Left,Lateral Lower Leg Performed By: Clinician Carlene Coria, RN Compression Type: Three Layer Post Procedure Diagnosis Same as Pre-procedure Electronic Signature(s) Signed: 03/25/2021 2:11:15 PM By: Carlene Coria RN Entered By: Carlene Coria on 03/25/2021 10:27:29 [name] ([id number]) -------------------------------------------------------------------------------- Encounter Discharge Information Details Patient Name: [name] Date of Service: 03/25/2021 10:00 AM Medical Record Number: [id number] Patient Account Number: [id number] Date of Birth/Sex: [birth year] (75 y.o. F) Treating RN: Carlene Coria Primary Care Chriss Redel: [name] Other Clinician: Referring Rube Sanchez: [name] Treating Drako Maese/Extender: Skipper Cliche in Treatment: 9 Encounter Discharge Information Items Discharge Condition: Stable Ambulatory Status: Walker Discharge Destination: Home Transportation: Private Auto Accompanied By: self Schedule Follow-up Appointment: Yes Clinical Summary of Care: Patient  Declined Electronic Signature(s) Signed: 03/25/2021 2:11:15 PM By: Carlene Coria RN Entered By: Carlene Coria on 03/25/2021 10:37:49 [name] ([id number]) -------------------------------------------------------------------------------- Lower Extremity Assessment Details Patient Name: [name] Date of Service: 03/25/2021 10:00 AM Medical Record Number: [id number] Patient Account Number: [id number] Date of Birth/Sex: 1946-03-17 (75 y.o. F) Treating RN: Carlene Coria Primary Care Izea Livolsi: [name] Other Clinician: Referring Luwana Butrick: [name] Treating Kimberley Speece/Extender: Skipper Cliche in Treatment: 9 Edema Assessment Assessed: [Left: No] [Right: No] Edema: [Left: Ye] [Right: s] Calf Left: Right: Point of Measurement: 27 cm From Medial Instep 37 cm Ankle Left: Right: Point of Measurement: 10 cm From Medial Instep 25 cm Vascular Assessment Pulses: Dorsalis Pedis Palpable: [Left:Yes] Electronic Signature(s) Signed: 03/25/2021 2:11:15 PM By: Carlene Coria RN Entered By: Carlene Coria on 03/25/2021 10:11:09 [name] ([id number]) -------------------------------------------------------------------------------- Multi Wound Chart Details Patient Name: [name] Date of Service: 03/25/2021 10:00 AM Medical Record Number: [id number] Patient Account Number: [id number] Date of Birth/Sex: [birth year] (75 y.o. F) Treating RN: Carlene Coria Primary Care Zade Falkner: [name] Other Clinician: Referring Shariq Puig: [name] Treating Mikell Kazlauskas/Extender: Skipper Cliche in Treatment: 9 Vital Signs Height(in): 69 Pulse(bpm): 67 Weight(lbs): 244 Blood Pressure(mmHg): 131/71 Body Mass Index(BMI): 36 Temperature(F): 98.3 Respiratory Rate(breaths/min): 18 Photos: [N/A:N/A] Wound Location: Left, Lateral Lower Leg N/A N/A Wounding Event: Gradually Appeared N/A N/A Primary Etiology: Venous Leg Ulcer N/A N/A Comorbid History: Cataracts, Lymphedema, Sleep  N/A N/A Apnea, Congestive Heart Failure, Hypertension, Osteoarthritis, Neuropathy, Received Chemotherapy, Received Radiation Date Acquired: 09/08/2020 N/A N/A Weeks of Treatment: 9 N/A N/A Wound Status: Open N/A N/A Measurements L x W x D (cm) 3x3.5x0.2 N/A N/A Area (cm) : 8.247 N/A N/A Volume (cm) : 1.649 N/A N/A % Reduction in Area: -75.00% N/A N/A % Reduction in Volume: -250.10% N/A N/A Classification: Full Thickness Without Exposed N/A N/A Support Structures Exudate Amount: Medium N/A N/A Exudate Type: Serosanguineous N/A N/A Exudate Color: red, brown N/A N/A Granulation Amount: Medium (34-66%) N/A N/A Granulation Quality: Red N/A N/A Necrotic Amount: Medium (34-66%) N/A N/A Exposed Structures: Fat Layer (Subcutaneous Tissue): N/A N/A Yes Fascia: No Tendon: No Muscle: No Joint: No Bone: No Epithelialization: Medium (34-66%) N/A N/A Treatment Notes Electronic Signature(s) Signed: 03/25/2021 2:11:15 PM By: Carlene Coria RN Entered By:  Carlene Coria on 03/25/2021 10:27:03 [name], [name] ([id number]) -------------------------------------------------------------------------------- [place] Details Patient Name: [name], [name] Date of Service: 03/25/2021 10:00 AM Medical Record Number: [id number] Patient Account Number: [id number] Date of Birth/Sex: [birth year] (75 y.o. F) Treating RN: Carlene Coria Primary Care Terrius Gentile: [name] Other Clinician: Referring Orlyn Odonoghue: [name] Treating Mariadelcarmen Corella/Extender: Skipper Cliche in Treatment: 9 Active Inactive Wound/Skin Impairment Nursing Diagnoses: Impaired tissue integrity Goals: Patient/caregiver will verbalize understanding of skin care regimen Date Initiated: 01/16/2021 Date Inactivated: 02/18/2021 Target Resolution Date: 02/16/2021 Goal Status: Met Ulcer/skin breakdown will have a volume reduction of 30% by week 4 Date Initiated: 01/16/2021 Date Inactivated: 02/18/2021 Target Resolution  Date: 02/16/2021 Goal Status: Met Ulcer/skin breakdown will have a volume reduction of 50% by week 8 Date Initiated: 01/16/2021 Target Resolution Date: 03/18/2021 Goal Status: Active Interventions: Assess ulceration(s) every visit Treatment Activities: Topical wound management initiated : 01/16/2021 Notes: Electronic Signature(s) Signed: 03/25/2021 2:11:15 PM By: Carlene Coria RN Entered By: Carlene Coria on 03/25/2021 10:26:48 [name] ([id number]) -------------------------------------------------------------------------------- Pain Assessment Details Patient Name: [name] Date of Service: 03/25/2021 10:00 AM Medical Record Number: [id number] Patient Account Number: [id number] Date of Birth/Sex: 1945-11-30 (75 y.o. F) Treating RN: Carlene Coria Primary Care Thatcher Doberstein: [name] Other Clinician: Referring Kenyonna Micek: [name] Treating Shaquanda Graves/Extender: Skipper Cliche in Treatment: 9 Active Problems Location of Pain Severity and Description of Pain Patient Has Paino No Site Locations Pain Management and Medication Current Pain Management: Electronic Signature(s) Signed: 03/25/2021 2:11:15 PM By: Carlene Coria RN Entered By: Carlene Coria on 03/25/2021 10:02:43 [name] ([id number]) -------------------------------------------------------------------------------- Patient/Caregiver Education Details Patient Name: [name] Date of Service: 03/25/2021 10:00 AM Medical Record Number: [id number] Patient Account Number: [id number] Date of Birth/Gender: 06-13-1946 (75 y.o. F) Treating RN: Carlene Coria Primary Care Physician: [name] Other Clinician: Referring Physician: [name] Treating Physician/Extender: Skipper Cliche in Treatment: 9 Education Assessment Education Provided To: Patient Education Topics Provided Wound/Skin Impairment: Methods: Explain/Verbal Responses: State content correctly Electronic Signature(s) Signed:  03/25/2021 2:11:15 PM By: Carlene Coria RN Entered By: Carlene Coria on 03/25/2021 10:28:36 [name] ([id number]) -------------------------------------------------------------------------------- Wound Assessment Details Patient Name: [name] Date of Service: 03/25/2021 10:00 AM Medical Record Number: [id number] Patient Account Number: [id number] Date of Birth/Sex: 07-07-1946 (75 y.o. F) Treating RN: Carlene Coria Primary Care Oneida Mckamey: [name] Other Clinician: Referring Ariyanah Aguado: [name] Treating Deona Novitski/Extender: Skipper Cliche in Treatment: 9 Wound Status Wound Number: 2 Primary Venous Leg Ulcer Etiology: Wound Location: Left, Lateral Lower Leg Wound Open Wounding Event: Gradually Appeared Status: Date Acquired: 09/08/2020 Comorbid Cataracts, Lymphedema, Sleep Apnea, Congestive Heart Weeks Of Treatment: 9 History: Failure, Hypertension, Osteoarthritis, Neuropathy, Clustered Wound: No Received Chemotherapy, Received Radiation Photos Wound Measurements Length: (cm) 3 Width: (cm) 3.5 Depth: (cm) 0.2 Area: (cm) 8.247 Volume: (cm) 1.649 % Reduction in Area: -75% % Reduction in Volume: -250.1% Epithelialization: Medium (34-66%) Tunneling: No Undermining: No Wound Description Classification: Full Thickness Without Exposed Support Structu Exudate Amount: Medium Exudate Type: Serosanguineous Exudate Color: red, brown res Foul Odor After Cleansing: No Slough/Fibrino Yes Wound Bed Granulation Amount: Medium (34-66%) Exposed Structure Granulation Quality: Red Fascia Exposed: No Necrotic Amount: Medium (34-66%) Fat Layer (Subcutaneous Tissue) Exposed: Yes Necrotic Quality: Adherent Slough Tendon Exposed: No Muscle Exposed: No Joint Exposed: No Bone Exposed: No Treatment Notes Wound #2 (Lower Leg) Wound Laterality: Left, Lateral Cleanser Soap and Water Discharge Instruction: Gently cleanse wound with antibacterial soap, rinse and pat dry prior  to dressing wounds Peri-Wound Care [place], Katasha ([id number]) Topical Triamcinolone  Acetonide Cream, 0.1%, 15 (g) tube Discharge Instruction: Apply as directed by Dalessandro Baldyga. Primary Dressing Silvercel Small 2x2 (in/in) Discharge Instruction: Apply Silvercel Small 2x2 (in/in) as instructed Secondary Dressing Xtrasorb Medium 4x5 (in/in) Discharge Instruction: Apply to wound as directed. Do not cut. Secured With Compression Wrap Profore Lite LF 3 Multilayer Compression Bandaging System Discharge Instruction: Apply 3 multi-layer wrap as prescribed. Compression Stockings Add-Ons Electronic Signature(s) Signed: 03/25/2021 2:11:15 PM By: Carlene Coria RN Entered By: Carlene Coria on 03/25/2021 10:10:12 [name] ([id number]) -------------------------------------------------------------------------------- Vitals Details Patient Name: [name] Date of Service: 03/25/2021 10:00 AM Medical Record Number: [id number] Patient Account Number: [id number] Date of Birth/Sex: February 19, 1946 (75 y.o. F) Treating RN: Carlene Coria Primary Care Jaidynn Balster: [name] Other Clinician: Referring Mingo Siegert: [name] Treating Crystallee Werden/Extender: Skipper Cliche in Treatment: 9 Vital Signs Time Taken: 10:02 Temperature (F): 98.3 Height (in): 69 Pulse (bpm): 65 Weight (lbs): 244 Respiratory Rate (breaths/min): 18 Body Mass Index (BMI): 36 Blood Pressure (mmHg): 131/71 Reference Range: 80 - 120 mg / dl Electronic Signature(s) Signed: 03/25/2021 2:11:15 PM By: Carlene Coria RN Entered By: Carlene Coria on 03/25/2021 10:02:30

## 2021-03-25 NOTE — Progress Notes (Addendum)
[name], [name] ([id number]) Visit Report for 03/25/2021 Chief Complaint Document Details Patient Name: [name], [name] Date of Service: 03/25/2021 10:00 AM Medical Record Number: [id number] Patient Account Number: [id number] Date of Birth/Sex: Aug 02, 1946 (75 y.o. F) Treating RN: [name] Primary Care Provider: [name] Other Clinician: Referring Provider: [name] Treating Provider/Extender: Skipper Cliche in Treatment: 9 Information Obtained from: Patient Chief Complaint Left LE Ulcer Electronic Signature(s) Signed: 03/25/2021 10:12:18 AM By: Worthy Keeler PA-C Entered By: Worthy Keeler on 03/25/2021 10:12:18 [name] ([id number]) -------------------------------------------------------------------------------- HPI Details Patient Name: [name] Date of Service: 03/25/2021 10:00 AM Medical Record Number: [id number] Patient Account Number: [id number] Date of Birth/Sex: [birth year] (75 y.o. F) Treating RN: [name] Primary Care Provider: [name] Other Clinician: Referring Provider: [name] Treating Provider/Extender: Skipper Cliche in Treatment: 9 History of Present Illness HPI Description: 03/15/2020 upon evaluation today patient presents today for initial evaluation here in our clinic concerning a wound that is on the left posterior lower extremity. Unfortunately this has been giving the patient some discomfort at this point which she notes has been affected her pretty much daily. The patient does have a history of venous insufficiency, hypertension, congestive heart failure, and a history of breast cancer. Fortunately there does not appear to be evidence of active infection at this time which is great news. No fevers, chills, nausea, vomiting, or diarrhea. Wound is not extremely large but does have some slough covering the surface of the wound. This is going require sharp debridement today. 03/15/2020 upon evaluation today patient  appears to be doing fairly well in regard to her wound. She does have some slough noted on the surface of the wound currently but I do believe the Iodoflex has been beneficial over the past week. There is no signs of active infection at this time which is great news and overall very pleased with the progress. No fevers, chills, nausea, vomiting, or diarrhea. 04/02/2020 upon evaluation today patient appears to be doing a little better in regard to her wound size wise this is not tremendously smaller she does have some slough buildup on the surface of the wound. With that being said this is going require some sharp debridement today to clear away some of the necrotic debris. Fortunately there is no evidence of active infection at this time. No fevers, chills, nausea, vomiting, or diarrhea. 04/10/2020 on evaluation today patient appears to be doing well with regard to her ulcer which is measuring somewhat smaller today. She actually has more granulation tissue noted which is also good news is no need for sharp debridement today. Fortunately there is no evidence of infection either which is also excellent. No fevers, chills, nausea, vomiting, or diarrhea. 04/26/2020 on evaluation today patient's wound actually is showing signs of good improvement which is great news. There does not appear to be any evidence of active infection. I do believe she is tolerating the collagen at this point. 05/03/2020 upon evaluation today patient's wound actually showed signs of good granulation at this time there does not appear to be any evidence of active infection which is great news and overall very pleased with where things stand. With that being said I do believe that the patient is can require some sharp debridement today but fortunately nothing too significant. 05/11/20 on evaluation today patient appears to be doing well in regard to her leg ulcer. She's been tolerating the dressing changes without complication.  Fortunately there is no signs of active infection at this time.  Overall I'm very pleased with where things stand. 05/18/2020 upon evaluation today patient's wound is showing signs of improvement is very dry and I think the collagen for the most part just dried to the wound bed. I am going to clean this away with sharp debridement in order to get this under control in my opinion. 05/25/2020 upon evaluation today patient actually appears to be doing well in regard to her leg ulcer. In fact upon inspection it appears she could potentially be completely healed but that is not guaranteed based on what we are seeing. There does not appear to be any signs of active infection which is great news. 05/31/2020 on evaluation today patient appears to be doing well with regard to her wounds. She in fact appears to be almost completely healed on the left leg there is just a very small opening remaining point 1 06/08/2020 upon evaluation today patient appears to be doing excellent in regard to her leg ulcer on the left in fact it appears to be that she is completely healed as of today. I see no signs of active infection at this time which is great news. No fevers, chills, nausea, vomiting, or diarrhea. READMISSION 01/16/2021 Patient that we have had in this clinic with a wound on the left posterior calf in the summer 2021 extending into October. She was felt to have chronic venous insufficiency. Per the patient's description she was discharged with what sounds like external compression stockings although she could not afford the "$75 per leg". She therefore did not get anything. Apparently her wound that she has currently is been in existence since January. She has been followed at vein and vascular with Unna boots and I think calcium alginate. She had ABIs done on 06/25/2020 that showed an noncompressible ABI on the right leg and the left leg but triphasic waveforms with good great toe pressures and a TBI of 0.90 on the  right and 0.91 on the left Surprisingly the patient also had venous reflux studies that were really unremarkable. This included no evidence of DVTs or SVTs but there was no evidence of deep venous insufficiency or superficial venous insufficiency in the greater saphenous or short saphenous veins bilaterally. I would wonder about more central venous issues or perhaps this is all lymphedema 01/25/2021 upon evaluation today patient appears to be doing decently well and guard to her wound. The good news is the Iodoflex that is job she saw Dr. Dellia Nims last week for readmission and to be honest I think that this has done extremely well over that week. I do believe that she can tolerate the sharp debridement today which will be good. I think that cleaning off the wound will likely allow [place] to be able to use a different type of dressing I do not think the Iodoflex will even be necessary based on how clean the wound looks. Fortunately there is no sign of active infection at this time which is great news. No fevers, chills, nausea, vomiting, or diarrhea. 02/12/2021 upon evaluation today patient appears to be doing well with regard to her leg ulcer. We did switch to alginate when she came for nurse visit I think is doing much better for her. Fortunately there does not appear to be any signs of active infection at this time which is great news. No fevers, chills, nausea, vomiting, or diarrhea. [name], [name] ([id number]) 02/18/2021 upon evaluation today patient's wound is actually showing signs of good improvement. I am very pleased with where things stand  currently. I do not see any signs of active infection which is great news and overall I feel like she is making good progress. The patient likewise is happy that things are doing so well and that this is measuring somewhat smaller. 02/25/2021 upon evaluation today patient actually appears to be doing quite well in regard to her wounds. She has been tolerating the  dressing changes without complication. Fortunately there does not appear to be any signs of active infection which is great news and overall I am extremely pleased with where things stand. No fevers, chills, nausea, vomiting, or diarrhea. She does have a lot of edema I really do think she needs compression socks to be worn in order to prevent things from causing additional issues for her currently. Especially on the right leg she should be wearing compression socks all the time to be honest. 03/04/2021 upon evaluation today patient's leg ulcer actually appears to be doing pretty well which is great news. There does not appear to be any significant change but overall the appearance is better even though the size is not necessarily reflecting a great improvement. Fortunately there does not appear to be any signs of active infection. No fevers, chills, nausea, vomiting, or diarrhea. 03/12/2021 upon evaluation today patient appears to be doing well with regard to her wounds. She has been tolerating the dressing changes without complication. The good news is that the wound on her left lateral leg is doing much better and is showing signs of good improvement. Overall her swelling is controlled well as well. She does need some compression socks she plans to go Jefferey socks next week. 03/18/2021 on evaluation today patient's wound actually is showing signs of good improvement. She does have a little bit of slough this can require some sharp debridement today. 03/25/2021 upon evaluation today patient appears to be doing well with regard to her wound on the left lateral leg. She has been tolerating the dressing changes without complication. Fortunately there does not appear to be any signs of active infection at this time. No fever chills no Electronic Signature(s) Signed: 03/25/2021 1:29:40 PM By: Worthy Keeler PA-C Entered By: Worthy Keeler on 03/25/2021 13:29:40 [name]  ([id number]) -------------------------------------------------------------------------------- Physical Exam Details Patient Name: [name] Date of Service: 03/25/2021 10:00 AM Medical Record Number: [id number] Patient Account Number: [id number] Date of Birth/Sex: September 11, 1945 (75 y.o. F) Treating RN: [name] Primary Care Provider: [name] Other Clinician: Referring Provider: [name] Treating Provider/Extender: Skipper Cliche in Treatment: 9 Constitutional Obese and well-hydrated in no acute distress. Respiratory normal breathing without difficulty. Psychiatric this patient is able to make decisions and demonstrates good insight into disease process. Alert and Oriented x 3. pleasant and cooperative. Notes Upon inspection patient's wound bed actually showed signs of good granulation epithelization at this point. There does not appear to be any evidence of infection I think that she is making some good progress here. I do believe that this is going slowly but nonetheless seems to be headed in the correct direction. Electronic Signature(s) Signed: 03/25/2021 1:32:36 PM By: Worthy Keeler PA-C Entered By: Worthy Keeler on 03/25/2021 13:32:36 [name] ([id number]) -------------------------------------------------------------------------------- Physician Orders Details Patient Name: [name] Date of Service: 03/25/2021 10:00 AM Medical Record Number: [id number] Patient Account Number: [id number] Date of Birth/Sex: 11-01-1945 (75 y.o. 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 Bathing/ Shower/ Hygiene o May shower with wound dressing protected with water repellent cover or cast protector. Edema Control - Lymphedema / Segmental Compressive Device / Other Left Lower Extremity o Optional: One layer of unna paste to top of compression wrap (to act as an anchor). o Tubigrip single layer applied. - RIGHT LEG tubi grip applied "E" until you get your compression sock use this and it's hand washable o Patient to wear own compression stockings. Remove compression stockings every night before going to bed and put on every morning when getting up. - on RIGHT LEG call [facility] for compression sock 15-22 mm/Hg, measurements and phone number on blue sheet [employer] on [employer] in [place] also has the compression socks o Elevate, Exercise Daily and Avoid Standing for Long Periods of Time. o Elevate legs to the level of the heart and pump ankles as often as possible o Elevate leg(s) parallel to the floor when sitting. o Compression Pump: Use compression pump on left lower extremity for 60 minutes, twice daily. - You may pump over wraps o Compression Pump: Use compression pump on right lower extremity for 60 minutes, twice daily. - You may pump over wraps Additional Orders / Instructions Wound #2 Left,Lateral Lower Leg o Follow Nutritious Diet and Increase Protein Intake - Increase protein intake. Wound Treatment Wound #2 - Lower Leg Wound Laterality: Left, Lateral Cleanser: Soap and Water 1 x Per Week/30 Days Discharge Instructions: Gently cleanse wound with antibacterial soap, rinse and pat dry prior to dressing wounds Topical: Triamcinolone Acetonide Cream, 0.1%, 15 (g) tube 1 x Per Week/30 Days Discharge Instructions: Apply as directed by provider. Primary Dressing: Silvercel Small 2x2 (in/in) 1 x Per  Week/30 Days Discharge Instructions: Apply Silvercel Small 2x2 (in/in) as instructed Secondary Dressing: Xtrasorb Medium 4x5 (in/in) 1 x Per Week/30 Days Discharge Instructions: Apply to wound as directed. Do not cut. Compression Wrap: Profore Lite LF 3 Multilayer Compression Bandaging System 1 x Per Week/30 Days Discharge Instructions: Apply 3 multi-layer wrap as prescribed. Electronic Signature(s) Signed: 03/25/2021 2:11:15 PM By: [name] Coria RN Signed: 03/25/2021 5:02:09 PM By: Worthy Keeler PA-C Entered By: [name] on 03/25/2021 10:27:53 [name], [name] ([id number]) [name], [name] ([id number]) -------------------------------------------------------------------------------- Problem List Details Patient Name: [name] Date of Service: 03/25/2021 10:00 AM Medical Record Number: [id number] Patient Account Number: [id number] Date of Birth/Sex: Oct 02, 1945 (75 y.o. F) Treating RN: [name] Primary Care Provider: [name] Other Clinician: Referring Provider: [name] Treating Provider/Extender: Skipper Cliche in Treatment: 9 Active Problems ICD-10 Encounter Code Description Active Date MDM Diagnosis I89.0 Lymphedema, not elsewhere classified 01/16/2021 No Yes I87.332 Chronic venous hypertension (idiopathic) with ulcer and inflammation of 01/16/2021 No Yes left lower extremity L97.828 Non-pressure chronic ulcer of other part of left lower leg with other 01/16/2021 No Yes specified severity Inactive Problems Resolved Problems Electronic Signature(s) Signed: 03/25/2021 10:12:13 AM By: Worthy Keeler PA-C Entered By: Worthy Keeler on 03/25/2021 10:12:12 [name] ([id number]) -------------------------------------------------------------------------------- Progress Note Details Patient Name: [name] Date of Service: 03/25/2021 10:00 AM Medical Record Number: [id number] Patient Account Number: [id number] Date of Birth/Sex: Jun 07, 1946 (75  y.o. F) Treating RN: [name] Primary Care Provider: [name] Other Clinician: Referring  Provider: [name] Treating Provider/Extender: Skipper Cliche in Treatment: 9 Subjective Chief Complaint Information obtained from Patient Left LE Ulcer History of Present Illness (HPI) 03/15/2020 upon evaluation today patient presents today for initial evaluation here in our clinic concerning a wound that is on the left posterior lower extremity. Unfortunately this has been giving the patient some discomfort at this point which she notes has been affected her pretty much daily. The patient does have a history of venous insufficiency, hypertension, congestive heart failure, and a history of breast cancer. Fortunately there does not appear to be evidence of active infection at this time which is great news. No fevers, chills, nausea, vomiting, or diarrhea. Wound is not extremely large but does have some slough covering the surface of the wound. This is going require sharp debridement today. 03/15/2020 upon evaluation today patient appears to be doing fairly well in regard to her wound. She does have some slough noted on the surface of the wound currently but I do believe the Iodoflex has been beneficial over the past week. There is no signs of active infection at this time which is great news and overall very pleased with the progress. No fevers, chills, nausea, vomiting, or diarrhea. 04/02/2020 upon evaluation today patient appears to be doing a little better in regard to her wound size wise this is not tremendously smaller she does have some slough buildup on the surface of the wound. With that being said this is going require some sharp debridement today to clear away some of the necrotic debris. Fortunately there is no evidence of active infection at this time. No fevers, chills, nausea, vomiting, or diarrhea. 04/10/2020 on evaluation today patient appears to be doing well with regard to her  ulcer which is measuring somewhat smaller today. She actually has more granulation tissue noted which is also good news is no need for sharp debridement today. Fortunately there is no evidence of infection either which is also excellent. No fevers, chills, nausea, vomiting, or diarrhea. 04/26/2020 on evaluation today patient's wound actually is showing signs of good improvement which is great news. There does not appear to be any evidence of active infection. I do believe she is tolerating the collagen at this point. 05/03/2020 upon evaluation today patient's wound actually showed signs of good granulation at this time there does not appear to be any evidence of active infection which is great news and overall very pleased with where things stand. With that being said I do believe that the patient is can require some sharp debridement today but fortunately nothing too significant. 05/11/20 on evaluation today patient appears to be doing well in regard to her leg ulcer. She's been tolerating the dressing changes without complication. Fortunately there is no signs of active infection at this time. Overall I'm very pleased with where things stand. 05/18/2020 upon evaluation today patient's wound is showing signs of improvement is very dry and I think the collagen for the most part just dried to the wound bed. I am going to clean this away with sharp debridement in order to get this under control in my opinion. 05/25/2020 upon evaluation today patient actually appears to be doing well in regard to her leg ulcer. In fact upon inspection it appears she could potentially be completely healed but that is not guaranteed based on what we are seeing. There does not appear to be any signs of active infection which is great news. 05/31/2020 on evaluation today patient appears to be doing well  with regard to her wounds. She in fact appears to be almost completely healed on the left leg there is just a very small opening  remaining point 1 06/08/2020 upon evaluation today patient appears to be doing excellent in regard to her leg ulcer on the left in fact it appears to be that she is completely healed as of today. I see no signs of active infection at this time which is great news. No fevers, chills, nausea, vomiting, or diarrhea. READMISSION 01/16/2021 Patient that we have had in this clinic with a wound on the left posterior calf in the summer 2021 extending into October. She was felt to have chronic venous insufficiency. Per the patient's description she was discharged with what sounds like external compression stockings although she could not afford the "$75 per leg". She therefore did not get anything. Apparently her wound that she has currently is been in existence since January. She has been followed at vein and vascular with Unna boots and I think calcium alginate. She had ABIs done on 06/25/2020 that showed an noncompressible ABI on the right leg and the left leg but triphasic waveforms with good great toe pressures and a TBI of 0.90 on the right and 0.91 on the left Surprisingly the patient also had venous reflux studies that were really unremarkable. This included no evidence of DVTs or SVTs but there was no evidence of deep venous insufficiency or superficial venous insufficiency in the greater saphenous or short saphenous veins bilaterally. I would wonder about more central venous issues or perhaps this is all lymphedema 01/25/2021 upon evaluation today patient appears to be doing decently well and guard to her wound. The good news is the Iodoflex that is job she saw Dr. Dellia Nims last week for readmission and to be honest I think that this has done extremely well over that week. I do believe that she can tolerate the sharp debridement today which will be good. I think that cleaning off the wound will likely allow [place] to be able to use a different type of dressing I do not think the Iodoflex will even be  necessary based on how clean the wound looks. Fortunately there is no sign of active infection at this time which is great news. No fevers, chills, nausea, vomiting, or diarrhea. [name], [name] ([id number]) 02/12/2021 upon evaluation today patient appears to be doing well with regard to her leg ulcer. We did switch to alginate when she came for nurse visit I think is doing much better for her. Fortunately there does not appear to be any signs of active infection at this time which is great news. No fevers, chills, nausea, vomiting, or diarrhea. 02/18/2021 upon evaluation today patient's wound is actually showing signs of good improvement. I am very pleased with where things stand currently. I do not see any signs of active infection which is great news and overall I feel like she is making good progress. The patient likewise is happy that things are doing so well and that this is measuring somewhat smaller. 02/25/2021 upon evaluation today patient actually appears to be doing quite well in regard to her wounds. She has been tolerating the dressing changes without complication. Fortunately there does not appear to be any signs of active infection which is great news and overall I am extremely pleased with where things stand. No fevers, chills, nausea, vomiting, or diarrhea. She does have a lot of edema I really do think she needs compression socks to be worn in order  to prevent things from causing additional issues for her currently. Especially on the right leg she should be wearing compression socks all the time to be honest. 03/04/2021 upon evaluation today patient's leg ulcer actually appears to be doing pretty well which is great news. There does not appear to be any significant change but overall the appearance is better even though the size is not necessarily reflecting a great improvement. Fortunately there does not appear to be any signs of active infection. No fevers, chills, nausea, vomiting,  or diarrhea. 03/12/2021 upon evaluation today patient appears to be doing well with regard to her wounds. She has been tolerating the dressing changes without complication. The good news is that the wound on her left lateral leg is doing much better and is showing signs of good improvement. Overall her swelling is controlled well as well. She does need some compression socks she plans to go Jefferey socks next week. 03/18/2021 on evaluation today patient's wound actually is showing signs of good improvement. She does have a little bit of slough this can require some sharp debridement today. 03/25/2021 upon evaluation today patient appears to be doing well with regard to her wound on the left lateral leg. She has been tolerating the dressing changes without complication. Fortunately there does not appear to be any signs of active infection at this time. No fever chills no Objective Constitutional Obese and well-hydrated in no acute distress. Vitals Time Taken: 10:02 AM, Height: 69 in, Weight: 244 lbs, BMI: 36, Temperature: 98.3 F, Pulse: 65 bpm, Respiratory Rate: 18 breaths/min, Blood Pressure: 131/71 mmHg. Respiratory normal breathing without difficulty. Psychiatric this patient is able to make decisions and demonstrates good insight into disease process. Alert and Oriented x 3. pleasant and cooperative. General Notes: Upon inspection patient's wound bed actually showed signs of good granulation epithelization at this point. There does not appear to be any evidence of infection I think that she is making some good progress here. I do believe that this is going slowly but nonetheless seems to be headed in the correct direction. Integumentary (Hair, Skin) Wound #2 status is Open. Original cause of wound was Gradually Appeared. The date acquired was: 09/08/2020. The wound has been in treatment 9 weeks. The wound is located on the Left,Lateral Lower Leg. The wound measures 3cm length x 3.5cm width x  0.2cm depth; 8.247cm^2 area and 1.649cm^3 volume. There is Fat Layer (Subcutaneous Tissue) exposed. There is no tunneling or undermining noted. There is a medium amount of serosanguineous drainage noted. There is medium (34-66%) red granulation within the wound bed. There is a medium (34-66%) amount of necrotic tissue within the wound bed including Adherent Slough. 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 There was a Three Layer Compression Therapy Procedure by [name] Coria, RN. Post procedure Diagnosis Wound #2: Same as Pre-Procedure Plan Follow-up Appointments: Return Appointment in 1 week. Bathing/ Shower/ Hygiene: May shower with wound dressing protected with water repellent cover or cast protector. Edema Control - Lymphedema / Segmental Compressive Device / Other: Optional: One layer of unna paste to top of compression wrap (to act as an anchor). Tubigrip single layer applied. - RIGHT LEG tubi grip applied "E" until you get your  compression sock use this and it's hand washable Patient to wear own compression stockings. Remove compression stockings every night before going to bed and put on every morning when getting up. - on RIGHT LEG call [employer] for compression sock 15-22 mm/Hg, measurements and phone number on blue sheet [employer] on [employer] in [place] also has the compression socks Elevate, Exercise Daily and Avoid Standing for Long Periods of Time. Elevate legs to the level of the heart and pump ankles as often as possible Elevate leg(s) parallel to the floor when sitting. Compression Pump: Use  compression pump on left lower extremity for 60 minutes, twice daily. - You may pump over wraps Compression Pump: Use compression pump on right lower extremity for 60 minutes, twice daily. - You may pump over wraps Additional Orders / Instructions: Wound #2 Left,Lateral Lower Leg: Follow Nutritious Diet and Increase Protein Intake - Increase protein intake. WOUND #2: - Lower Leg Wound Laterality: Left, Lateral Cleanser: Soap and Water 1 x Per Week/30 Days Discharge Instructions: Gently cleanse wound with antibacterial soap, rinse and pat dry prior to dressing wounds Topical: Triamcinolone Acetonide Cream, 0.1%, 15 (g) tube 1 x Per Week/30 Days Discharge Instructions: Apply as directed by provider. Primary Dressing: Silvercel Small 2x2 (in/in) 1 x Per Week/30 Days Discharge Instructions: Apply Silvercel Small 2x2 (in/in) as instructed Secondary Dressing: Xtrasorb Medium 4x5 (in/in) 1 x Per Week/30 Days Discharge Instructions: Apply to wound as directed. Do not cut. Compression Wrap: Profore Lite LF 3 Multilayer Compression Bandaging System 1 x Per Week/30 Days Discharge Instructions: Apply 3 multi-layer wrap as prescribed. 1. Would recommend currently that we going continue with the wound care measures as before and the patient is in agreement with plan. This includes the use of the silver alginate dressing followed by Lauraine Rinne. 2. I am also going to recommend we continue with 3 layer compression wrap. 3. I am also can recommend that we have the patient continue to elevate her legs much as possible to try to keep edema under good control. We will see patient back for reevaluation in 1 week here in the clinic. If anything worsens or changes patient will contact our office for additional recommendations. Electronic Signature(s) Signed: 03/25/2021 1:33:19 PM By: Worthy Keeler PA-C Entered By: Worthy Keeler on 03/25/2021 13:33:19 [name]  ([id number]) -------------------------------------------------------------------------------- SuperBill Details Patient Name: [name] Date of Service: 03/25/2021 Medical Record Number: [id number] Patient Account Number: [id number] Date of Birth/Sex: August 12, 1946 (75 y.o. F) Treating RN: [name] Primary Care Provider: [name] Other Clinician: Referring Provider: [name] Treating Provider/Extender: Skipper Cliche in Treatment: 9 Diagnosis Coding ICD-10 Codes Code Description I89.0 Lymphedema, not elsewhere classified I87.332 Chronic venous hypertension (idiopathic) with ulcer and inflammation of left lower extremity L97.828 Non-pressure chronic ulcer of other part of left lower leg with other specified severity Facility Procedures CPT4 Code: [id number] Description: (Facility Use Only) [phone] - [place] LWR LT LEG Modifier: Quantity: 1 Physician Procedures CPT4 Code Description: [id number] - WC PHYS LEVEL 3 - EST PT Modifier: Quantity: 1 CPT4 Code Description: ICD-10 Diagnosis Description I89.0 Lymphedema, not elsewhere classified I87.332 Chronic venous hypertension (idiopathic) with ulcer and inflammation of le L97.828 Non-pressure chronic ulcer of other part of left lower leg with  other spec Modifier: ft lower extremity ified severity Quantity: Electronic Signature(s) Signed: 03/25/2021 1:34:15 PM By: Worthy Keeler PA-C Entered By: Worthy Keeler on 03/25/2021 13:34:14

## 2021-04-01 ENCOUNTER — Other Ambulatory Visit: Payer: Self-pay

## 2021-04-01 ENCOUNTER — Encounter: Payer: Medicare Other | Admitting: Physician Assistant

## 2021-04-01 DIAGNOSIS — I890 Lymphedema, not elsewhere classified: Secondary | ICD-10-CM | POA: Diagnosis not present

## 2021-04-01 NOTE — Progress Notes (Addendum)
ANAB, GUILFOYLE ([id number]) Visit Report for 04/01/2021 Chief Complaint Document Details Patient Name: [name], [name] Date of Service: 04/01/2021 3:30 PM Medical Record Number: [id number] Patient Account Number: [id number] Date of Birth/Sex: 1946/03/05 (75 y.o. F) Treating RN: Carlene Coria Primary Care Provider: [name] Other Clinician: Referring Provider: [name] Treating Provider/Extender: Skipper Cliche in Treatment: 10 Information Obtained from: Patient Chief Complaint Left LE Ulcer Electronic Signature(s) Signed: 04/01/2021 3:42:37 PM By: Worthy Keeler PA-C Entered By: Worthy Keeler on 04/01/2021 15:42:37 [name] ([id number]) -------------------------------------------------------------------------------- HPI Details Patient Name: [name] Date of Service: 04/01/2021 3:30 PM Medical Record Number: [id number] Patient Account Number: [id number] Date of Birth/Sex: 10-Mar-1946 (75 y.o. F) Treating RN: Carlene Coria Primary Care Provider: [name] Other Clinician: Referring Provider: [name] Treating Provider/Extender: Skipper Cliche in Treatment: 10 History of Present Illness HPI Description: 03/15/2020 upon evaluation today patient presents today for initial evaluation here in our clinic concerning a wound that is on the left posterior lower extremity. Unfortunately this has been giving the patient some discomfort at this point which she notes has been affected her pretty much daily. The patient does have a history of venous insufficiency, hypertension, congestive heart failure, and a history of breast cancer. Fortunately there does not appear to be evidence of active infection at this time which is great news. No fevers, chills, nausea, vomiting, or diarrhea. Wound is not extremely large but does have some slough covering the surface of the wound. This is going require sharp debridement today. 03/15/2020 upon evaluation today patient  appears to be doing fairly well in regard to her wound. She does have some slough noted on the surface of the wound currently but I do believe the Iodoflex has been beneficial over the past week. There is no signs of active infection at this time which is great news and overall very pleased with the progress. No fevers, chills, nausea, vomiting, or diarrhea. 04/02/2020 upon evaluation today patient appears to be doing a little better in regard to her wound size wise this is not tremendously smaller she does have some slough buildup on the surface of the wound. With that being said this is going require some sharp debridement today to clear away some of the necrotic debris. Fortunately there is no evidence of active infection at this time. No fevers, chills, nausea, vomiting, or diarrhea. 04/10/2020 on evaluation today patient appears to be doing well with regard to her ulcer which is measuring somewhat smaller today. She actually has more granulation tissue noted which is also good news is no need for sharp debridement today. Fortunately there is no evidence of infection either which is also excellent. No fevers, chills, nausea, vomiting, or diarrhea. 04/26/2020 on evaluation today patient's wound actually is showing signs of good improvement which is great news. There does not appear to be any evidence of active infection. I do believe she is tolerating the collagen at this point. 05/03/2020 upon evaluation today patient's wound actually showed signs of good granulation at this time there does not appear to be any evidence of active infection which is great news and overall very pleased with where things stand. With that being said I do believe that the patient is can require some sharp debridement today but fortunately nothing too significant. 05/11/20 on evaluation today patient appears to be doing well in regard to her leg ulcer. She's been tolerating the dressing changes without complication.  Fortunately there is no signs of active infection at this time.  Overall I'm very pleased with where things stand. 05/18/2020 upon evaluation today patient's wound is showing signs of improvement is very dry and I think the collagen for the most part just dried to the wound bed. I am going to clean this away with sharp debridement in order to get this under control in my opinion. 05/25/2020 upon evaluation today patient actually appears to be doing well in regard to her leg ulcer. In fact upon inspection it appears she could potentially be completely healed but that is not guaranteed based on what we are seeing. There does not appear to be any signs of active infection which is great news. 05/31/2020 on evaluation today patient appears to be doing well with regard to her wounds. She in fact appears to be almost completely healed on the left leg there is just a very small opening remaining point 1 06/08/2020 upon evaluation today patient appears to be doing excellent in regard to her leg ulcer on the left in fact it appears to be that she is completely healed as of today. I see no signs of active infection at this time which is great news. No fevers, chills, nausea, vomiting, or diarrhea. READMISSION 01/16/2021 Patient that we have had in this clinic with a wound on the left posterior calf in the summer 2021 extending into October. She was felt to have chronic venous insufficiency. Per the patient's description she was discharged with what sounds like external compression stockings although she could not afford the "$75 per leg". She therefore did not get anything. Apparently her wound that she has currently is been in existence since January. She has been followed at vein and vascular with Unna boots and I think calcium alginate. She had ABIs done on 06/25/2020 that showed an noncompressible ABI on the right leg and the left leg but triphasic waveforms with good great toe pressures and a TBI of 0.90 on the  right and 0.91 on the left Surprisingly the patient also had venous reflux studies that were really unremarkable. This included no evidence of DVTs or SVTs but there was no evidence of deep venous insufficiency or superficial venous insufficiency in the greater saphenous or short saphenous veins bilaterally. I would wonder about more central venous issues or perhaps this is all lymphedema 01/25/2021 upon evaluation today patient appears to be doing decently well and guard to her wound. The good news is the Iodoflex that is job she saw Dr. Dellia Nims last week for readmission and to be honest I think that this has done extremely well over that week. I do believe that she can tolerate the sharp debridement today which will be good. I think that cleaning off the wound will likely allow [place] to be able to use a different type of dressing I do not think the Iodoflex will even be necessary based on how clean the wound looks. Fortunately there is no sign of active infection at this time which is great news. No fevers, chills, nausea, vomiting, or diarrhea. 02/12/2021 upon evaluation today patient appears to be doing well with regard to her leg ulcer. We did switch to alginate when she came for nurse visit I think is doing much better for her. Fortunately there does not appear to be any signs of active infection at this time which is great news. No fevers, chills, nausea, vomiting, or diarrhea. [name], [name] ([id number]) 02/18/2021 upon evaluation today patient's wound is actually showing signs of good improvement. I am very pleased with where things stand  currently. I do not see any signs of active infection which is great news and overall I feel like she is making good progress. The patient likewise is happy that things are doing so well and that this is measuring somewhat smaller. 02/25/2021 upon evaluation today patient actually appears to be doing quite well in regard to her wounds. She has been tolerating the  dressing changes without complication. Fortunately there does not appear to be any signs of active infection which is great news and overall I am extremely pleased with where things stand. No fevers, chills, nausea, vomiting, or diarrhea. She does have a lot of edema I really do think she needs compression socks to be worn in order to prevent things from causing additional issues for her currently. Especially on the right leg she should be wearing compression socks all the time to be honest. 03/04/2021 upon evaluation today patient's leg ulcer actually appears to be doing pretty well which is great news. There does not appear to be any significant change but overall the appearance is better even though the size is not necessarily reflecting a great improvement. Fortunately there does not appear to be any signs of active infection. No fevers, chills, nausea, vomiting, or diarrhea. 03/12/2021 upon evaluation today patient appears to be doing well with regard to her wounds. She has been tolerating the dressing changes without complication. The good news is that the wound on her left lateral leg is doing much better and is showing signs of good improvement. Overall her swelling is controlled well as well. She does need some compression socks she plans to go Jefferey socks next week. 03/18/2021 on evaluation today patient's wound actually is showing signs of good improvement. She does have a little bit of slough this can require some sharp debridement today. 03/25/2021 upon evaluation today patient appears to be doing well with regard to her wound on the left lateral leg. She has been tolerating the dressing changes without complication. Fortunately there does not appear to be any signs of active infection at this time. No fever chills no 04/01/2021 upon evaluation today patient's wound is showing signs of improvement and overall very pleased with where things stand at this point. There is no evidence of active  infection which is great news as well and in general I think that she is making great progress. Electronic Signature(s) Signed: 04/01/2021 3:58:54 PM By: Worthy Keeler PA-C Entered By: Worthy Keeler on 04/01/2021 15:58:54 [name], [name] ([id number]) -------------------------------------------------------------------------------- Physical Exam Details Patient Name: [name] Date of Service: 04/01/2021 3:30 PM Medical Record Number: [id number] Patient Account Number: [id number] Date of Birth/Sex: 11/18/1945 (75 y.o. F) Treating RN: Carlene Coria Primary Care Provider: [name] Other Clinician: Referring Provider: [name] Treating Provider/Extender: Skipper Cliche in Treatment: 10 Constitutional Well-nourished and well-hydrated in no acute distress. Respiratory normal breathing without difficulty. Psychiatric this patient is able to make decisions and demonstrates good insight into disease process. Alert and Oriented x 3. pleasant and cooperative. Notes Patient's wound bed showed signs of good granulation epithelization at this point. I do not see any evidence of active infection which is great please with where things stand at this point. No fevers, chills, nausea, vomiting, or diarrhea. Electronic Signature(s) Signed: 04/01/2021 3:59:24 PM By: Worthy Keeler PA-C Entered By: Worthy Keeler on 04/01/2021 15:59:24 [name], [name] ([id number]) -------------------------------------------------------------------------------- Physician Orders Details Patient Name: [name] Date of Service: 04/01/2021 3:30 PM Medical Record Number: [id number] Patient Account Number: [id number] Date of  Birth/Sex: 1946-06-27 (75 y.o. 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 Bathing/ Shower/ Hygiene o May shower with wound dressing protected with water repellent cover or cast protector. Edema Control - Lymphedema / Segmental Compressive Device / Other Left Lower Extremity o Optional: One layer of unna paste to top of compression wrap (to act as an anchor). o Tubigrip single layer applied. - RIGHT LEG tubi grip applied "E" until you get your compression sock use this and it's hand washable o Patient to wear own compression stockings. Remove compression stockings every night before going to bed and put on every morning when getting up. - on RIGHT LEG call [employer] for compression sock 15-22 mm/Hg, measurements and phone number on blue sheet [employer] on [employer] in [place] also has the compression socks o Elevate, Exercise Daily and Avoid Standing for Long Periods of Time. o Elevate legs to the level of the heart and pump ankles as often as possible o Elevate leg(s) parallel to the floor when sitting. o Compression Pump: Use compression pump on left lower extremity for 60 minutes, twice daily. - You may pump over wraps o Compression Pump: Use compression pump on right lower extremity for 60 minutes, twice daily. - You may pump over wraps Additional Orders / Instructions Wound #2 Left,Lateral Lower Leg o Follow Nutritious Diet and Increase Protein Intake - Increase protein intake. Wound Treatment Wound #2 - Lower Leg Wound Laterality: Left, Lateral Cleanser: Soap and Water 1 x Per Week/30 Days Discharge Instructions: Gently cleanse wound with antibacterial soap, rinse and pat dry prior to dressing wounds Topical:  Triamcinolone Acetonide Cream, 0.1%, 15 (g) tube 1 x Per Week/30 Days Discharge Instructions: Apply as directed by provider. Primary Dressing: Silvercel Small 2x2 (in/in) 1 x Per Week/30 Days Discharge Instructions: Apply Silvercel Small 2x2 (in/in) as instructed Secondary Dressing: Xtrasorb Medium 4x5 (in/in) 1 x Per Week/30 Days Discharge Instructions: Apply to wound as directed. Do not cut. Compression Wrap: Profore Lite LF 3 Multilayer Compression Bandaging System 1 x Per Week/30 Days Discharge Instructions: Apply 3 multi-layer wrap as prescribed. Electronic Signature(s) Signed: 04/01/2021 4:44:39 PM By: Worthy Keeler PA-C Signed: 04/05/2021 4:32:50 PM By: Carlene Coria RN Entered By: Carlene Coria on 04/01/2021 15:55:59 [name], [name] ([id number]) [name], [name] ([id number]) -------------------------------------------------------------------------------- Problem List Details Patient Name: [name] Date of Service: 04/01/2021 3:30 PM Medical Record Number: [id number] Patient Account Number: [id number] Date of Birth/Sex: February 08, 1946 (75 y.o. F) Treating RN: Carlene Coria Primary Care Provider: [name] Other Clinician: Referring Provider: [name] Treating Provider/Extender: Skipper Cliche in Treatment: 10 Active Problems ICD-10 Encounter Code Description Active Date MDM Diagnosis I89.0 Lymphedema, not elsewhere classified 01/16/2021 No Yes I87.332 Chronic venous hypertension (idiopathic) with ulcer and inflammation of 01/16/2021 No Yes left lower extremity L97.828 Non-pressure chronic ulcer of other part of left lower leg with other 01/16/2021 No Yes specified severity Inactive Problems Resolved Problems Electronic Signature(s) Signed: 04/01/2021 3:42:28 PM By: Worthy Keeler PA-C Entered By: Worthy Keeler on 04/01/2021 15:42:27 [name] ([id number]) -------------------------------------------------------------------------------- Progress Note  Details Patient Name: [name] Date  of Service: 04/01/2021 3:30 PM Medical Record Number: [id number] Patient Account Number: [id number] Date of Birth/Sex: 06/19/1946 (75 y.o. F) Treating RN: Carlene Coria Primary Care Provider: [name] Other Clinician: Referring Provider: [name] Treating Provider/Extender: Skipper Cliche in Treatment: 10 Subjective Chief Complaint Information obtained from Patient Left LE Ulcer History of Present Illness (HPI) 03/15/2020 upon evaluation today patient presents today for initial evaluation here in our clinic concerning a wound that is on the left posterior lower extremity. Unfortunately this has been giving the patient some discomfort at this point which she notes has been affected her pretty much daily. The patient does have a history of venous insufficiency, hypertension, congestive heart failure, and a history of breast cancer. Fortunately there does not appear to be evidence of active infection at this time which is great news. No fevers, chills, nausea, vomiting, or diarrhea. Wound is not extremely large but does have some slough covering the surface of the wound. This is going require sharp debridement today. 03/15/2020 upon evaluation today patient appears to be doing fairly well in regard to her wound. She does have some slough noted on the surface of the wound currently but I do believe the Iodoflex has been beneficial over the past week. There is no signs of active infection at this time which is great news and overall very pleased with the progress. No fevers, chills, nausea, vomiting, or diarrhea. 04/02/2020 upon evaluation today patient appears to be doing a little better in regard to her wound size wise this is not tremendously smaller she does have some slough buildup on the surface of the wound. With that being said this is going require some sharp debridement today to clear away some of the necrotic debris. Fortunately there is  no evidence of active infection at this time. No fevers, chills, nausea, vomiting, or diarrhea. 04/10/2020 on evaluation today patient appears to be doing well with regard to her ulcer which is measuring somewhat smaller today. She actually has more granulation tissue noted which is also good news is no need for sharp debridement today. Fortunately there is no evidence of infection either which is also excellent. No fevers, chills, nausea, vomiting, or diarrhea. 04/26/2020 on evaluation today patient's wound actually is showing signs of good improvement which is great news. There does not appear to be any evidence of active infection. I do believe she is tolerating the collagen at this point. 05/03/2020 upon evaluation today patient's wound actually showed signs of good granulation at this time there does not appear to be any evidence of active infection which is great news and overall very pleased with where things stand. With that being said I do believe that the patient is can require some sharp debridement today but fortunately nothing too significant. 05/11/20 on evaluation today patient appears to be doing well in regard to her leg ulcer. She's been tolerating the dressing changes without complication. Fortunately there is no signs of active infection at this time. Overall I'm very pleased with where things stand. 05/18/2020 upon evaluation today patient's wound is showing signs of improvement is very dry and I think the collagen for the most part just dried to the wound bed. I am going to clean this away with sharp debridement in order to get this under control in my opinion. 05/25/2020 upon evaluation today patient actually appears to be doing well in regard to her leg ulcer. In fact upon inspection it appears she could potentially be completely healed but that is not  guaranteed based on what we are seeing. There does not appear to be any signs of active infection which is great news. 05/31/2020 on  evaluation today patient appears to be doing well with regard to her wounds. She in fact appears to be almost completely healed on the left leg there is just a very small opening remaining point 1 06/08/2020 upon evaluation today patient appears to be doing excellent in regard to her leg ulcer on the left in fact it appears to be that she is completely healed as of today. I see no signs of active infection at this time which is great news. No fevers, chills, nausea, vomiting, or diarrhea. READMISSION 01/16/2021 Patient that we have had in this clinic with a wound on the left posterior calf in the summer 2021 extending into October. She was felt to have chronic venous insufficiency. Per the patient's description she was discharged with what sounds like external compression stockings although she could not afford the "$75 per leg". She therefore did not get anything. Apparently her wound that she has currently is been in existence since January. She has been followed at vein and vascular with Unna boots and I think calcium alginate. She had ABIs done on 06/25/2020 that showed an noncompressible ABI on the right leg and the left leg but triphasic waveforms with good great toe pressures and a TBI of 0.90 on the right and 0.91 on the left Surprisingly the patient also had venous reflux studies that were really unremarkable. This included no evidence of DVTs or SVTs but there was no evidence of deep venous insufficiency or superficial venous insufficiency in the greater saphenous or short saphenous veins bilaterally. I would wonder about more central venous issues or perhaps this is all lymphedema 01/25/2021 upon evaluation today patient appears to be doing decently well and guard to her wound. The good news is the Iodoflex that is job she saw Dr. Dellia Nims last week for readmission and to be honest I think that this has done extremely well over that week. I do believe that she can tolerate the sharp  debridement today which will be good. I think that cleaning off the wound will likely allow [place] to be able to use a different type of dressing I do not think the Iodoflex will even be necessary based on how clean the wound looks. Fortunately there is no sign of active infection at this time which is great news. No fevers, chills, nausea, vomiting, or diarrhea. [name], [name] ([id number]) 02/12/2021 upon evaluation today patient appears to be doing well with regard to her leg ulcer. We did switch to alginate when she came for nurse visit I think is doing much better for her. Fortunately there does not appear to be any signs of active infection at this time which is great news. No fevers, chills, nausea, vomiting, or diarrhea. 02/18/2021 upon evaluation today patient's wound is actually showing signs of good improvement. I am very pleased with where things stand currently. I do not see any signs of active infection which is great news and overall I feel like she is making good progress. The patient likewise is happy that things are doing so well and that this is measuring somewhat smaller. 02/25/2021 upon evaluation today patient actually appears to be doing quite well in regard to her wounds. She has been tolerating the dressing changes without complication. Fortunately there does not appear to be any signs of active infection which is great news and overall I am extremely  pleased with where things stand. No fevers, chills, nausea, vomiting, or diarrhea. She does have a lot of edema I really do think she needs compression socks to be worn in order to prevent things from causing additional issues for her currently. Especially on the right leg she should be wearing compression socks all the time to be honest. 03/04/2021 upon evaluation today patient's leg ulcer actually appears to be doing pretty well which is great news. There does not appear to be any significant change but overall the appearance is  better even though the size is not necessarily reflecting a great improvement. Fortunately there does not appear to be any signs of active infection. No fevers, chills, nausea, vomiting, or diarrhea. 03/12/2021 upon evaluation today patient appears to be doing well with regard to her wounds. She has been tolerating the dressing changes without complication. The good news is that the wound on her left lateral leg is doing much better and is showing signs of good improvement. Overall her swelling is controlled well as well. She does need some compression socks she plans to go Jefferey socks next week. 03/18/2021 on evaluation today patient's wound actually is showing signs of good improvement. She does have a little bit of slough this can require some sharp debridement today. 03/25/2021 upon evaluation today patient appears to be doing well with regard to her wound on the left lateral leg. She has been tolerating the dressing changes without complication. Fortunately there does not appear to be any signs of active infection at this time. No fever chills no 04/01/2021 upon evaluation today patient's wound is showing signs of improvement and overall very pleased with where things stand at this point. There is no evidence of active infection which is great news as well and in general I think that she is making great progress. Objective Constitutional Well-nourished and well-hydrated in no acute distress. Vitals Time Taken: 3:37 PM, Height: 69 in, Weight: 244 lbs, BMI: 36, Temperature: 98.8 F, Pulse: 76 bpm, Respiratory Rate: 18 breaths/min, Blood Pressure: 127/70 mmHg. Respiratory normal breathing without difficulty. Psychiatric this patient is able to make decisions and demonstrates good insight into disease process. Alert and Oriented x 3. pleasant and cooperative. General Notes: Patient's wound bed showed signs of good granulation epithelization at this point. I do not see any evidence of active  infection which is great please with where things stand at this point. No fevers, chills, nausea, vomiting, or diarrhea. Integumentary (Hair, Skin) Wound #2 status is Open. Original cause of wound was Gradually Appeared. The date acquired was: 09/08/2020. The wound has been in treatment 10 weeks. The wound is located on the Left,Lateral Lower Leg. The wound measures 3cm length x 3cm width x 0.2cm depth; 7.069cm^2 area and 1.414cm^3 volume. There is Fat Layer (Subcutaneous Tissue) exposed. There is no tunneling or undermining noted. There is a medium amount of serosanguineous drainage noted. There is medium (34-66%) red granulation within the wound bed. There is a medium (34-66%) amount of necrotic tissue within the wound bed including Adherent Slough. 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 There was a Three Layer Compression Therapy Procedure by Carlene Coria, RN. Post procedure Diagnosis Wound #2: Same as Pre-Procedure Plan  Follow-up Appointments: Return Appointment in 1 week. Bathing/ Shower/ Hygiene: May shower with wound dressing protected with water repellent cover or cast protector. Edema Control - Lymphedema / Segmental Compressive Device / Other: Optional: One layer of unna paste to top of compression wrap (to act as an anchor). Tubigrip single layer applied. - RIGHT LEG tubi grip applied "E" until you get your compression sock use this and it's hand washable Patient to wear own compression stockings. Remove compression stockings every night before going to bed and put on every morning when getting up. - on RIGHT LEG call  [employer] for compression sock 15-22 mm/Hg, measurements and phone number on blue sheet [employer] on [employer] in [place] also has the compression socks Elevate, Exercise Daily and Avoid Standing for Long Periods of Time. Elevate legs to the level of the heart and pump ankles as often as possible Elevate leg(s) parallel to the floor when sitting. Compression Pump: Use compression pump on left lower extremity for 60 minutes, twice daily. - You may pump over wraps Compression Pump: Use compression pump on right lower extremity for 60 minutes, twice daily. - You may pump over wraps Additional Orders / Instructions: Wound #2 Left,Lateral Lower Leg: Follow Nutritious Diet and Increase Protein Intake - Increase protein intake. WOUND #2: - Lower Leg Wound Laterality: Left, Lateral Cleanser: Soap and Water 1 x Per Week/30 Days Discharge Instructions: Gently cleanse wound with antibacterial soap, rinse and pat dry prior to dressing wounds Topical: Triamcinolone Acetonide Cream, 0.1%, 15 (g) tube 1 x Per Week/30 Days Discharge Instructions: Apply as directed by provider. Primary Dressing: Silvercel Small 2x2 (in/in) 1 x Per Week/30 Days Discharge Instructions: Apply Silvercel Small 2x2 (in/in) as instructed Secondary Dressing: Xtrasorb Medium 4x5 (in/in) 1 x Per Week/30 Days Discharge Instructions: Apply to wound as directed. Do not cut. Compression Wrap: Profore Lite LF 3 Multilayer Compression Bandaging System 1 x Per Week/30 Days Discharge Instructions: Apply 3 multi-layer wrap as prescribed. 1. I am going to recommend that we go ahead and continue with the wound care measures as before and the patient is in agreement with the plan. This includes the use of the silver alginate dressing which I think is doing a great job. This includes the use of silver cell followed by XtraSorb. Also using a 3 layer compression wrap. 2. I am also can recommend the patient should continue to use  her lymphedema pumps. She is to try to keep her edema under control is much as possible to help this heal as quickly as possible. 3. I am also can recommend that she continue to monitor for anything such as increased pain which may indicate infection right now I have not seen anything that seems to be an issue in that regard. We will see patient back for reevaluation in 1 week here in the clinic. If anything worsens or changes patient will contact our office for additional recommendations. Electronic Signature(s) Signed: 04/01/2021 4:00:22 PM By: Worthy Keeler PA-C Entered By: Worthy Keeler on 04/01/2021 16:00:22 [name], [name] ([id number]) -------------------------------------------------------------------------------- SuperBill Details Patient Name: [name] Date of Service: 04/01/2021 Medical Record Number: [id number] Patient Account Number: [id number] Date of Birth/Sex: June 14, 1946 (75 y.o. F) Treating RN: Carlene Coria Primary Care Provider: [name] Other Clinician: Referring Provider: [name] Treating Provider/Extender: Skipper Cliche in Treatment: 10 Diagnosis Coding ICD-10 Codes Code Description I89.0 Lymphedema, not elsewhere classified I87.332 Chronic venous hypertension (idiopathic) with ulcer and inflammation of left lower extremity L97.828 Non-pressure  chronic ulcer of other part of left lower leg with other specified severity Facility Procedures CPT4 Code: [id number] Description: (Facility Use Only) [phone] - [place] LWR LT LEG Modifier: Quantity: 1 Physician Procedures CPT4 Code Description: [id number] - WC PHYS LEVEL 3 - EST PT Modifier: Quantity: 1 CPT4 Code Description: ICD-10 Diagnosis Description I89.0 Lymphedema, not elsewhere classified I87.332 Chronic venous hypertension (idiopathic) with ulcer and inflammation of le L97.828 Non-pressure chronic ulcer of other part of left lower leg with  other spec Modifier: ft lower  extremity ified severity Quantity: Electronic Signature(s) Signed: 04/01/2021 4:44:39 PM By: Worthy Keeler PA-C Signed: 04/05/2021 4:32:50 PM By: Carlene Coria RN Previous Signature: 04/01/2021 4:02:21 PM Version By: Worthy Keeler PA-C Entered By: Carlene Coria on 04/01/2021 16:06:06

## 2021-04-06 NOTE — Progress Notes (Signed)
ZIANNA, DERCOLE ([id number]) Visit Report for 04/01/2021 Arrival Information Details Patient Name: [name], [name] Date of Service: 04/01/2021 3:30 PM Medical Record Number: [id number] Patient Account Number: [id number] Date of Birth/Sex: December 04, 1945 (75 y.o. F) Treating RN: Carlene Coria Primary Care Gurnie Duris: [name] Other Clinician: Referring Dashay Giesler: [name] Treating Talin Rozeboom/Extender: Skipper Cliche in Treatment: 10 Visit Information History Since Last Visit All ordered tests and consults were completed: No Patient Arrived: Ambulatory Added or deleted any medications: No Arrival Time: 15:20 Any new allergies or adverse reactions: No Accompanied By: self Had a fall or experienced change in No Transfer Assistance: None activities of daily living that may affect Patient Identification Verified: Yes risk of falls: Secondary Verification Process Completed: Yes Signs or symptoms of abuse/neglect since last visito No Patient Requires Transmission-Based Precautions: No Hospitalized since last visit: No Patient Has Alerts: Yes Implantable device outside of the clinic excluding No Patient Alerts: NOT DIABETIC cellular tissue based products placed in the center since last visit: Has Dressing in Place as Prescribed: Yes Has Compression in Place as Prescribed: Yes Pain Present Now: Yes Electronic Signature(s) Signed: 04/05/2021 4:32:50 PM By: Carlene Coria RN Entered By: Carlene Coria on 04/01/2021 15:27:50 [name] ([id number]) -------------------------------------------------------------------------------- Clinic Level of Care Assessment Details Patient Name: [name] Date of Service: 04/01/2021 3:30 PM Medical Record Number: [id number] Patient Account Number: [id number] Date of Birth/Sex: 09-Nov-1945 (75 y.o. F) Treating RN: Carlene Coria Primary Care Daesha Insco: [name] Other Clinician: Referring Clarene Curran: [name] Treating Emrys Mceachron/Extender:  Skipper Cliche in Treatment: 10 Clinic Level of Care Assessment Items TOOL 1 Quantity Score []  - Use when EandM and Procedure is performed on INITIAL visit 0 ASSESSMENTS - Nursing Assessment / Reassessment []  - General Physical Exam (combine w/ comprehensive assessment (listed just below) when performed on new 0 pt. evals) []  - 0 Comprehensive Assessment (HX, ROS, Risk Assessments, Wounds Hx, etc.) ASSESSMENTS - Wound and Skin Assessment / Reassessment []  - Dermatologic / Skin Assessment (not related to wound area) 0 ASSESSMENTS - Ostomy and/or Continence Assessment and Care []  - Incontinence Assessment and Management 0 []  - 0 Ostomy Care Assessment and Management (repouching, etc.) PROCESS - Coordination of Care []  - Simple Patient / Family Education for ongoing care 0 []  - 0 Complex (extensive) Patient / Family Education for ongoing care []  - 0 Staff obtains Programmer, systems, Records, Test Results / Process Orders []  - 0 Staff telephones HHA, Nursing Homes / Clarify orders / etc []  - 0 Routine Transfer to another Facility (non-emergent condition) []  - 0 Routine Hospital Admission (non-emergent condition) []  - 0 New Admissions / Biomedical engineer / Ordering NPWT, Apligraf, etc. []  - 0 Emergency Hospital Admission (emergent condition) PROCESS - Special Needs []  - Pediatric / Minor Patient Management 0 []  - 0 Isolation Patient Management []  - 0 Hearing / Language / Visual special needs []  - 0 Assessment of Community assistance (transportation, D/C planning, etc.) []  - 0 Additional assistance / Altered mentation []  - 0 Support Surface(s) Assessment (bed, cushion, seat, etc.) INTERVENTIONS - Miscellaneous []  - External ear exam 0 []  - 0 Patient Transfer (multiple staff / Civil Service fast streamer / Similar devices) []  - 0 Simple Staple / Suture removal (25 or less) []  - 0 Complex Staple / Suture removal (26 or more) []  - 0 Hypo/Hyperglycemic Management (do not check if billed  separately) []  - 0 Ankle / Brachial Index (ABI) - do not check if billed separately Has the patient been seen at the hospital within the last  three years: Yes Total Score: 0 Level Of Care: ____ [name] ([id number]) Electronic Signature(s) Signed: 04/05/2021 4:32:50 PM By: Carlene Coria RN Entered By: Carlene Coria on 04/01/2021 16:05:43 [name] ([id number]) -------------------------------------------------------------------------------- Compression Therapy Details Patient Name: [name] Date of Service: 04/01/2021 3:30 PM Medical Record Number: [id number] Patient Account Number: [id number] Date of Birth/Sex: [birth year] (75 y.o. F) Treating RN: Carlene Coria Primary Care Diania Co: [name] Other Clinician: Referring Elivia Robotham: [name] Treating Antwian Santaana/Extender: Skipper Cliche in Treatment: 10 Compression Therapy Performed for Wound Assessment: Wound #2 Left,Lateral Lower Leg Performed By: Clinician Carlene Coria, RN Compression Type: Three Layer Post Procedure Diagnosis Same as Pre-procedure Electronic Signature(s) Signed: 04/05/2021 4:32:50 PM By: Carlene Coria RN Entered By: Carlene Coria on 04/01/2021 15:55:36 [name] ([id number]) -------------------------------------------------------------------------------- Encounter Discharge Information Details Patient Name: [name] Date of Service: 04/01/2021 3:30 PM Medical Record Number: [id number] Patient Account Number: [id number] Date of Birth/Sex: August 31, 1946 (75 y.o. F) Treating RN: Carlene Coria Primary Care Nyzir Dubois: [name] Other Clinician: Referring Audi Wettstein: [name] Treating Anavi Branscum/Extender: Skipper Cliche in Treatment: 10 Encounter Discharge Information Items Discharge Condition: Stable Ambulatory Status: Walker Discharge Destination: Home Transportation: Private Auto Accompanied By: self Schedule Follow-up Appointment: Yes Clinical Summary of Care: Patient  Declined Electronic Signature(s) Signed: 04/05/2021 4:32:50 PM By: Carlene Coria RN Entered By: Carlene Coria on 04/01/2021 16:07:08 [name] ([id number]) -------------------------------------------------------------------------------- Lower Extremity Assessment Details Patient Name: [name] Date of Service: 04/01/2021 3:30 PM Medical Record Number: [id number] Patient Account Number: [id number] Date of Birth/Sex: December 03, 1945 (75 y.o. F) Treating RN: Carlene Coria Primary Care Kayton Dunaj: [name] Other Clinician: Referring Tarra Pence: [name] Treating Chelsey Kimberley/Extender: Skipper Cliche in Treatment: 10 Edema Assessment Assessed: [Left: No] [Right: No] Edema: [Left: Ye] [Right: s] Calf Left: Right: Point of Measurement: 27 cm From Medial Instep 40 cm Ankle Left: Right: Point of Measurement: 10 cm From Medial Instep 25 cm Vascular Assessment Pulses: Dorsalis Pedis Palpable: [Left:Yes] Electronic Signature(s) Signed: 04/05/2021 4:32:50 PM By: Carlene Coria RN Entered By: Carlene Coria on 04/01/2021 15:40:01 [name] ([id number]) -------------------------------------------------------------------------------- Multi Wound Chart Details Patient Name: [name] Date of Service: 04/01/2021 3:30 PM Medical Record Number: [id number] Patient Account Number: [id number] Date of Birth/Sex: [birth year] (75 y.o. F) Treating RN: Carlene Coria Primary Care Tyeshia Cornforth: [name] Other Clinician: Referring Aidan Moten: [name] Treating Alyshia Kernan/Extender: Skipper Cliche in Treatment: 10 Vital Signs Height(in): 63 Pulse(bpm): 69 Weight(lbs): 244 Blood Pressure(mmHg): 127/70 Body Mass Index(BMI): 36 Temperature(F): 98.8 Respiratory Rate(breaths/min): 18 Photos: [N/A:N/A] Wound Location: Left, Lateral Lower Leg N/A N/A Wounding Event: Gradually Appeared N/A N/A Primary Etiology: Venous Leg Ulcer N/A N/A Comorbid History: Cataracts, Lymphedema, Sleep  N/A N/A Apnea, Congestive Heart Failure, Hypertension, Osteoarthritis, Neuropathy, Received Chemotherapy, Received Radiation Date Acquired: 09/08/2020 N/A N/A Weeks of Treatment: 10 N/A N/A Wound Status: Open N/A N/A Measurements L x W x D (cm) 3x3x0.2 N/A N/A Area (cm) : 7.069 N/A N/A Volume (cm) : 1.414 N/A N/A % Reduction in Area: -50.00% N/A N/A % Reduction in Volume: -200.20% N/A N/A Classification: Full Thickness Without Exposed N/A N/A Support Structures Exudate Amount: Medium N/A N/A Exudate Type: Serosanguineous N/A N/A Exudate Color: red, brown N/A N/A Granulation Amount: Medium (34-66%) N/A N/A Granulation Quality: Red N/A N/A Necrotic Amount: Medium (34-66%) N/A N/A Exposed Structures: Fat Layer (Subcutaneous Tissue): N/A N/A Yes Fascia: No Tendon: No Muscle: No Joint: No Bone: No Epithelialization: Medium (34-66%) N/A N/A Treatment Notes Electronic Signature(s) Signed: 04/05/2021 4:32:50 PM By: Carlene Coria RN Entered By:  Carlene Coria on 04/01/2021 15:55:04 CORI, JUSTUS ([id number]) -------------------------------------------------------------------------------- [place] Details Patient Name: AMIJAH, TIMOTHY Date of Service: 04/01/2021 3:30 PM Medical Record Number: [id number] Patient Account Number: [id number] Date of Birth/Sex: 09-21-1945 (75 y.o. F) Treating RN: Carlene Coria Primary Care Wilmarie Sparlin: [name] Other Clinician: Referring Tilda Samudio: [name] Treating Doralene Glanz/Extender: Skipper Cliche in Treatment: 10 Active Inactive Wound/Skin Impairment Nursing Diagnoses: Impaired tissue integrity Goals: Patient/caregiver will verbalize understanding of skin care regimen Date Initiated: 01/16/2021 Date Inactivated: 02/18/2021 Target Resolution Date: 02/16/2021 Goal Status: Met Ulcer/skin breakdown will have a volume reduction of 30% by week 4 Date Initiated: 01/16/2021 Date Inactivated: 02/18/2021 Target Resolution Date:  02/16/2021 Goal Status: Met Ulcer/skin breakdown will have a volume reduction of 50% by week 8 Date Initiated: 01/16/2021 Date Inactivated: 04/01/2021 Target Resolution Date: 03/18/2021 Goal Status: Unmet Unmet Reason: comorbities Ulcer/skin breakdown will have a volume reduction of 80% by week 12 Date Initiated: 04/01/2021 Target Resolution Date: 04/18/2021 Goal Status: Active Ulcer/skin breakdown will heal within 14 weeks Date Initiated: 04/01/2021 Target Resolution Date: 05/19/2021 Goal Status: Active Interventions: Assess ulceration(s) every visit Treatment Activities: Topical wound management initiated : 01/16/2021 Notes: Electronic Signature(s) Signed: 04/05/2021 4:32:50 PM By: Carlene Coria RN Entered By: Carlene Coria on 04/01/2021 15:54:45 [name] ([id number]) -------------------------------------------------------------------------------- Pain Assessment Details Patient Name: [name] Date of Service: 04/01/2021 3:30 PM Medical Record Number: [id number] Patient Account Number: [id number] Date of Birth/Sex: Oct 23, 1945 (75 y.o. F) Treating RN: Carlene Coria Primary Care Yesenia Fontenette: [name] Other Clinician: Referring Keltin Baird: [name] Treating Coreon Simkins/Extender: Skipper Cliche in Treatment: 10 Active Problems Location of Pain Severity and Description of Pain Patient Has Paino Yes Site Locations With Dressing Change: No Duration of the Pain. Constant / Intermittento Intermittent How Long Does it Lasto Hours: Minutes: 15 Rate the pain. Current Pain Level: 5 Worst Pain Level: 9 Least Pain Level: 0 Tolerable Pain Level: 5 Character of Pain Describe the Pain: Aching Pain Management and Medication Current Pain Management: Medication: Yes Cold Application: No Rest: Yes Massage: No Activity: No T.E.N.S.: No Heat Application: No Leg drop or elevation: No Is the Current Pain Management Adequate: Inadequate How does your wound impact your  activities of daily livingo Sleep: No Bathing: No Appetite: No Relationship With Others: No Bladder Continence: No Emotions: No Bowel Continence: No Work: No Toileting: No Drive: No Dressing: No Hobbies: No Electronic Signature(s) Signed: 04/05/2021 4:32:50 PM By: Carlene Coria RN Entered By: Carlene Coria on 04/01/2021 15:32:44 [name] ([id number]) -------------------------------------------------------------------------------- Patient/Caregiver Education Details Patient Name: [name] Date of Service: 04/01/2021 3:30 PM Medical Record Number: [id number] Patient Account Number: [id number] Date of Birth/Gender: [birth year] (75 y.o. F) Treating RN: Carlene Coria Primary Care Physician: [name] Other Clinician: Referring Physician: [name] Treating Physician/Extender: Skipper Cliche in Treatment: 10 Education Assessment Education Provided To: Patient Education Topics Provided Wound/Skin Impairment: Methods: Explain/Verbal Responses: State content correctly Electronic Signature(s) Signed: 04/05/2021 4:32:50 PM By: Carlene Coria RN Entered By: Carlene Coria on 04/01/2021 16:06:17 [name] ([id number]) -------------------------------------------------------------------------------- Wound Assessment Details Patient Name: [name] Date of Service: 04/01/2021 3:30 PM Medical Record Number: [id number] Patient Account Number: [id number] Date of Birth/Sex: [birth year] (75 y.o. F) Treating RN: Carlene Coria Primary Care Emry Tobin: [name] Other Clinician: Referring Jaeleigh Monaco: [name] Treating Dajae Kizer/Extender: Skipper Cliche in Treatment: 10 Wound Status Wound Number: 2 Primary Venous Leg Ulcer Etiology: Wound Location: Left, Lateral Lower Leg Wound Open Wounding Event: Gradually Appeared Status: Date Acquired: 09/08/2020 Comorbid Cataracts, Lymphedema, Sleep Apnea, Congestive  Heart Weeks Of Treatment: 10 History: Failure,  Hypertension, Osteoarthritis, Neuropathy, Clustered Wound: No Received Chemotherapy, Received Radiation Photos Wound Measurements Length: (cm) 3 Width: (cm) 3 Depth: (cm) 0.2 Area: (cm) 7.069 Volume: (cm) 1.414 % Reduction in Area: -50% % Reduction in Volume: -200.2% Epithelialization: Medium (34-66%) Tunneling: No Undermining: No Wound Description Classification: Full Thickness Without Exposed Support Structu Exudate Amount: Medium Exudate Type: Serosanguineous Exudate Color: red, brown res Foul Odor After Cleansing: No Slough/Fibrino Yes Wound Bed Granulation Amount: Medium (34-66%) Exposed Structure Granulation Quality: Red Fascia Exposed: No Necrotic Amount: Medium (34-66%) Fat Layer (Subcutaneous Tissue) Exposed: Yes Necrotic Quality: Adherent Slough Tendon Exposed: No Muscle Exposed: No Joint Exposed: No Bone Exposed: No Treatment Notes Wound #2 (Lower Leg) Wound Laterality: Left, Lateral Cleanser Soap and Water Discharge Instruction: Gently cleanse wound with antibacterial soap, rinse and pat dry prior to dressing wounds Peri-Wound Care [place], Rabecca ([id number]) Topical Triamcinolone Acetonide Cream, 0.1%, 15 (g) tube Discharge Instruction: Apply as directed by [place]. Primary Dressing Silvercel Small 2x2 (in/in) Discharge Instruction: Apply Silvercel Small 2x2 (in/in) as instructed Secondary Dressing Xtrasorb Medium 4x5 (in/in) Discharge Instruction: Apply to wound as directed. Do not cut. Secured With Compression Wrap Profore Lite LF 3 Multilayer Compression Bandaging System Discharge Instruction: Apply 3 multi-layer wrap as prescribed. Compression Stockings Add-Ons Electronic Signature(s) Signed: 04/05/2021 4:32:50 PM By: Carlene Coria RN Entered By: Carlene Coria on 04/01/2021 15:39:22 HENRINE, HAYTER ([id number]) -------------------------------------------------------------------------------- Vitals Details Patient Name: [name] Date of Service: 04/01/2021 3:30 PM Medical Record Number: [id number] Patient Account Number: [id number] Date of Birth/Sex: [birth year] (75 y.o. F) Treating RN: Carlene Coria Primary Care Lanissa Cashen: [name] Other Clinician: Referring [place]: [name] Treating Jamar Casagrande/Extender: Skipper Cliche in Treatment: 10 Vital Signs Time Taken: 15:37 Temperature (F): 98.8 Height (in): 69 Pulse (bpm): 76 Weight (lbs): 244 Respiratory Rate (breaths/min): 18 Body Mass Index (BMI): 36 Blood Pressure (mmHg): 127/70 Reference Range: 80 - 120 mg / dl Electronic Signature(s) Signed: 04/05/2021 4:32:50 PM By: Carlene Coria RN Entered By: Carlene Coria on 04/01/2021 15:30:36

## 2021-04-09 ENCOUNTER — Other Ambulatory Visit: Payer: Self-pay

## 2021-04-09 ENCOUNTER — Encounter: Payer: Medicare Other | Attending: Physician Assistant | Admitting: Physician Assistant

## 2021-04-09 DIAGNOSIS — E669 Obesity, unspecified: Secondary | ICD-10-CM | POA: Insufficient documentation

## 2021-04-09 DIAGNOSIS — I509 Heart failure, unspecified: Secondary | ICD-10-CM | POA: Diagnosis not present

## 2021-04-09 DIAGNOSIS — Z6836 Body mass index (BMI) 36.0-36.9, adult: Secondary | ICD-10-CM | POA: Insufficient documentation

## 2021-04-09 DIAGNOSIS — I890 Lymphedema, not elsewhere classified: Secondary | ICD-10-CM | POA: Insufficient documentation

## 2021-04-09 DIAGNOSIS — I872 Venous insufficiency (chronic) (peripheral): Secondary | ICD-10-CM | POA: Diagnosis not present

## 2021-04-09 DIAGNOSIS — I87332 Chronic venous hypertension (idiopathic) with ulcer and inflammation of left lower extremity: Secondary | ICD-10-CM | POA: Diagnosis not present

## 2021-04-09 DIAGNOSIS — G629 Polyneuropathy, unspecified: Secondary | ICD-10-CM | POA: Insufficient documentation

## 2021-04-09 DIAGNOSIS — I110 Hypertensive heart disease with heart failure: Secondary | ICD-10-CM | POA: Insufficient documentation

## 2021-04-09 DIAGNOSIS — L97828 Non-pressure chronic ulcer of other part of left lower leg with other specified severity: Secondary | ICD-10-CM | POA: Insufficient documentation

## 2021-04-09 DIAGNOSIS — Z9221 Personal history of antineoplastic chemotherapy: Secondary | ICD-10-CM | POA: Insufficient documentation

## 2021-04-09 DIAGNOSIS — Z853 Personal history of malignant neoplasm of breast: Secondary | ICD-10-CM | POA: Diagnosis not present

## 2021-04-09 DIAGNOSIS — G4730 Sleep apnea, unspecified: Secondary | ICD-10-CM | POA: Insufficient documentation

## 2021-04-09 DIAGNOSIS — Z923 Personal history of irradiation: Secondary | ICD-10-CM | POA: Insufficient documentation

## 2021-04-09 NOTE — Progress Notes (Addendum)
[name], [name] ([id number]) Visit Report for 04/09/2021 Arrival Information Details Patient Name: [name], [name] Date of Service: 04/09/2021 10:15 AM Medical Record Number: [id number] Patient Account Number: [id number] Date of Birth/Sex: Apr 23, 1946 (75 y.o. F) Treating RN: Carlene Coria Primary Care Dunya Meiners: [name] Other Clinician: Referring Laurene Melendrez: [name] Treating Coden Franchi/Extender: Skipper Cliche in Treatment: 11 Visit Information History Since Last Visit All ordered tests and consults were completed: No Patient Arrived: [name] Added or deleted any medications: No Arrival Time: 10:26 Any new allergies or adverse reactions: No Accompanied By: self Had a fall or experienced change in No Transfer Assistance: None activities of daily living that may affect Patient Identification Verified: Yes risk of falls: Secondary Verification Process Completed: Yes Signs or symptoms of abuse/neglect since last visito No Patient Requires Transmission-Based Precautions: No Hospitalized since last visit: No Patient Has Alerts: Yes Implantable device outside of the clinic excluding No Patient Alerts: NOT DIABETIC cellular tissue based products placed in the center since last visit: Has Dressing in Place as Prescribed: Yes Has Compression in Place as Prescribed: Yes Pain Present Now: No Electronic Signature(s) Signed: 04/10/2021 2:51:00 PM By: Carlene Coria RN Entered By: Carlene Coria on 04/09/2021 10:44:28 [name] ([id number]) -------------------------------------------------------------------------------- Clinic Level of Care Assessment Details Patient Name: [name] Date of Service: 04/09/2021 10:15 AM Medical Record Number: [id number] Patient Account Number: [id number] Date of Birth/Sex: 1946-03-25 (75 y.o. F) Treating RN: Carlene Coria Primary Care Benoit Meech: [name] Other Clinician: Referring Leopoldo Mazzie: [name] Treating Talullah Abate/Extender: Skipper Cliche in Treatment: 11 Clinic Level of Care Assessment Items TOOL 1 Quantity Score []  - Use when EandM and Procedure is performed on INITIAL visit 0 ASSESSMENTS - Nursing Assessment / Reassessment []  - General Physical Exam (combine w/ comprehensive assessment (listed just below) when performed on new 0 pt. evals) []  - 0 Comprehensive Assessment (HX, ROS, Risk Assessments, Wounds Hx, etc.) ASSESSMENTS - Wound and Skin Assessment / Reassessment []  - Dermatologic / Skin Assessment (not related to wound area) 0 ASSESSMENTS - Ostomy and/or Continence Assessment and Care []  - Incontinence Assessment and Management 0 []  - 0 Ostomy Care Assessment and Management (repouching, etc.) PROCESS - Coordination of Care []  - Simple Patient / Family Education for ongoing care 0 []  - 0 Complex (extensive) Patient / Family Education for ongoing care []  - 0 Staff obtains Programmer, systems, Records, Test Results / Process Orders []  - 0 Staff telephones HHA, Nursing Homes / Clarify orders / etc []  - 0 Routine Transfer to another Facility (non-emergent condition) []  - 0 Routine Hospital Admission (non-emergent condition) []  - 0 New Admissions / Biomedical engineer / Ordering NPWT, Apligraf, etc. []  - 0 Emergency Hospital Admission (emergent condition) PROCESS - Special Needs []  - Pediatric / Minor Patient Management 0 []  - 0 Isolation Patient Management []  - 0 Hearing / Language / Visual special needs []  - 0 Assessment of Community assistance (transportation, D/C planning, etc.) []  - 0 Additional assistance / Altered mentation []  - 0 Support Surface(s) Assessment (bed, cushion, seat, etc.) INTERVENTIONS - Miscellaneous []  - External ear exam 0 []  - 0 Patient Transfer (multiple staff / Civil Service fast streamer / Similar devices) []  - 0 Simple Staple / Suture removal (25 or less) []  - 0 Complex Staple / Suture removal (26 or more) []  - 0 Hypo/Hyperglycemic Management (do not check if billed  separately) []  - 0 Ankle / Brachial Index (ABI) - do not check if billed separately Has the patient been seen at the hospital within the last  three years: Yes Total Score: 0 Level Of Care: ____ [name] ([id number]) Electronic Signature(s) Signed: 04/10/2021 2:51:00 PM By: Carlene Coria RN Entered By: Carlene Coria on 04/09/2021 11:07:55 [name] ([id number]) -------------------------------------------------------------------------------- Compression Therapy Details Patient Name: [name] Date of Service: 04/09/2021 10:15 AM Medical Record Number: [id number] Patient Account Number: [id number] Date of Birth/Sex: 12-31-1945 (75 y.o. F) Treating RN: Carlene Coria Primary Care Falyn Rubel: [name] Other Clinician: Referring Fayette Gasner: [name] Treating Shawntay Prest/Extender: Skipper Cliche in Treatment: 11 Compression Therapy Performed for Wound Assessment: Wound #2 Left,Lateral Lower Leg Performed By: Clinician Carlene Coria, RN Compression Type: Three Layer Post Procedure Diagnosis Same as Pre-procedure Electronic Signature(s) Signed: 04/10/2021 2:51:00 PM By: Carlene Coria RN Entered By: Carlene Coria on 04/09/2021 11:07:04 [name] ([id number]) -------------------------------------------------------------------------------- Encounter Discharge Information Details Patient Name: [name] Date of Service: 04/09/2021 10:15 AM Medical Record Number: [id number] Patient Account Number: [id number] Date of Birth/Sex: [birth year] (75 y.o. F) Treating RN: Carlene Coria Primary Care Latrina Guttman: [name] Other Clinician: Referring Pria Klosinski: [name] Treating Jyair Kiraly/Extender: Skipper Cliche in Treatment: 11 Encounter Discharge Information Items Discharge Condition: Stable Ambulatory Status: Walker Discharge Destination: Home Transportation: Private Auto Accompanied By: self Schedule Follow-up Appointment: Yes Clinical Summary of Care: Patient  Declined Electronic Signature(s) Signed: 04/10/2021 2:51:00 PM By: Carlene Coria RN Entered By: Carlene Coria on 04/09/2021 11:08:58 [name] ([id number]) -------------------------------------------------------------------------------- Lower Extremity Assessment Details Patient Name: [name] Date of Service: 04/09/2021 10:15 AM Medical Record Number: [id number] Patient Account Number: [id number] Date of Birth/Sex: [birth year] (75 y.o. F) Treating RN: Carlene Coria Primary Care Chistina Roston: [name] Other Clinician: Referring Venus Ruhe: [name] Treating Nozomi Mettler/Extender: Skipper Cliche in Treatment: 11 Edema Assessment Assessed: [Left: No] [Right: No] Edema: [Left: Ye] [Right: s] Calf Left: Right: Point of Measurement: 27 cm From Medial Instep 36 cm Ankle Left: Right: Point of Measurement: 10 cm From Medial Instep 25 cm Vascular Assessment Pulses: Dorsalis Pedis Palpable: [Left:Yes] Electronic Signature(s) Signed: 04/10/2021 2:51:00 PM By: Carlene Coria RN Entered By: Carlene Coria on 04/09/2021 10:46:19 [name] ([id number]) -------------------------------------------------------------------------------- Multi Wound Chart Details Patient Name: [name] Date of Service: 04/09/2021 10:15 AM Medical Record Number: [id number] Patient Account Number: [id number] Date of Birth/Sex: 04/15/1946 (75 y.o. F) Treating RN: Carlene Coria Primary Care Kirin Brandenburger: [name] Other Clinician: Referring Reagen Goates: [name] Treating Jaylyn Booher/Extender: Skipper Cliche in Treatment: 11 Vital Signs Height(in): 63 Pulse(bpm): 98 Weight(lbs): 244 Blood Pressure(mmHg): 134/74 Body Mass Index(BMI): 36 Temperature(F): 98.3 Respiratory Rate(breaths/min): 18 Photos: [N/A:N/A] Wound Location: Left, Lateral Lower Leg N/A N/A Wounding Event: Gradually Appeared N/A N/A Primary Etiology: Venous Leg Ulcer N/A N/A Comorbid History: Cataracts, Lymphedema, Sleep N/A  N/A Apnea, Congestive Heart Failure, Hypertension, Osteoarthritis, Neuropathy, Received Chemotherapy, Received Radiation Date Acquired: 09/08/2020 N/A N/A Weeks of Treatment: 11 N/A N/A Wound Status: Open N/A N/A Measurements L x W x D (cm) 2.5x3x0.2 N/A N/A Area (cm) : 5.89 N/A N/A Volume (cm) : 1.178 N/A N/A % Reduction in Area: -25.00% N/A N/A % Reduction in Volume: -150.10% N/A N/A Classification: Full Thickness Without Exposed N/A N/A Support Structures Exudate Amount: Medium N/A N/A Exudate Type: Serosanguineous N/A N/A Exudate Color: red, brown N/A N/A Granulation Amount: Medium (34-66%) N/A N/A Granulation Quality: Red N/A N/A Necrotic Amount: Medium (34-66%) N/A N/A Exposed Structures: Fat Layer (Subcutaneous Tissue): N/A N/A Yes Fascia: No Tendon: No Muscle: No Joint: No Bone: No Epithelialization: Medium (34-66%) N/A N/A Treatment Notes Electronic Signature(s) Signed: 04/10/2021 2:51:00 PM By: Carlene Coria RN Entered By:  Carlene Coria on 04/09/2021 11:06:44 [name], [name] ([id number]) -------------------------------------------------------------------------------- Multi-Disciplinary Care Plan Details Patient Name: [name], [name] Date of Service: 04/09/2021 10:15 AM Medical Record Number: [id number] Patient Account Number: [id number] Date of Birth/Sex: Apr 16, 1946 (75 y.o. F) Treating RN: Carlene Coria Primary Care Cruzita Lipa: [name] Other Clinician: Referring Galen Russman: [name] Treating Loel Betancur/Extender: Skipper Cliche in Treatment: 11 Active Inactive Wound/Skin Impairment Nursing Diagnoses: Impaired tissue integrity Goals: Patient/caregiver will verbalize understanding of skin care regimen Date Initiated: 01/16/2021 Date Inactivated: 02/18/2021 Target Resolution Date: 02/16/2021 Goal Status: Met Ulcer/skin breakdown will have a volume reduction of 30% by week 4 Date Initiated: 01/16/2021 Date Inactivated: 02/18/2021 Target Resolution Date:  02/16/2021 Goal Status: Met Ulcer/skin breakdown will have a volume reduction of 50% by week 8 Date Initiated: 01/16/2021 Date Inactivated: 04/01/2021 Target Resolution Date: 03/18/2021 Goal Status: Unmet Unmet Reason: comorbities Ulcer/skin breakdown will have a volume reduction of 80% by week 12 Date Initiated: 04/01/2021 Target Resolution Date: 04/18/2021 Goal Status: Active Ulcer/skin breakdown will heal within 14 weeks Date Initiated: 04/01/2021 Target Resolution Date: 05/19/2021 Goal Status: Active Interventions: Assess ulceration(s) every visit Treatment Activities: Topical wound management initiated : 01/16/2021 Notes: Electronic Signature(s) Signed: 04/10/2021 2:51:00 PM By: Carlene Coria RN Entered By: Carlene Coria on 04/09/2021 11:06:35 [name] ([id number]) -------------------------------------------------------------------------------- Pain Assessment Details Patient Name: [name] Date of Service: 04/09/2021 10:15 AM Medical Record Number: [id number] Patient Account Number: [id number] Date of Birth/Sex: 06/16/1946 (75 y.o. F) Treating RN: Carlene Coria Primary Care Numan Zylstra: [name] Other Clinician: Referring Marlayna Bannister: [name] Treating Aliveah Gallant/Extender: Skipper Cliche in Treatment: 11 Active Problems Location of Pain Severity and Description of Pain Patient Has Paino No Site Locations Pain Management and Medication Current Pain Management: Electronic Signature(s) Signed: 04/10/2021 2:51:00 PM By: Carlene Coria RN Entered By: Carlene Coria on 04/09/2021 10:44:49 [name] ([id number]) -------------------------------------------------------------------------------- Patient/Caregiver Education Details Patient Name: [name] Date of Service: 04/09/2021 10:15 AM Medical Record Number: [id number] Patient Account Number: [id number] Date of Birth/Gender: Jun 30, 1946 (75 y.o. F) Treating RN: Carlene Coria Primary Care Physician: [name] Other Clinician: Referring Physician: [name] Treating Physician/Extender: Skipper Cliche in Treatment: 11 Education Assessment Education Provided To: Patient Education Topics Provided Wound/Skin Impairment: Methods: Explain/Verbal Responses: State content correctly Electronic Signature(s) Signed: 04/10/2021 2:51:00 PM By: Carlene Coria RN Entered By: Carlene Coria on 04/09/2021 11:08:18 [name] ([id number]) -------------------------------------------------------------------------------- Wound Assessment Details Patient Name: [name] Date of Service: 04/09/2021 10:15 AM Medical Record Number: [id number] Patient Account Number: [id number] Date of Birth/Sex: 01-22-1946 (75 y.o. F) Treating RN: Carlene Coria Primary Care Yazlynn Birkeland: [name] Other Clinician: Referring Carsten Carstarphen: [name] Treating Makylah Bossard/Extender: Skipper Cliche in Treatment: 11 Wound Status Wound Number: 2 Primary Venous Leg Ulcer Etiology: Wound Location: Left, Lateral Lower Leg Wound Open Wounding Event: Gradually Appeared Status: Date Acquired: 09/08/2020 Comorbid Cataracts, Lymphedema, Sleep Apnea, Congestive Heart Weeks Of Treatment: 11 History: Failure, Hypertension, Osteoarthritis, Neuropathy, Clustered Wound: No Received Chemotherapy, Received Radiation Photos Wound Measurements Length: (cm) 2.5 Width: (cm) 3 Depth: (cm) 0.2 Area: (cm) 5.89 Volume: (cm) 1.178 % Reduction in Area: -25% % Reduction in Volume: -150.1% Epithelialization: Medium (34-66%) Tunneling: No Undermining: No Wound Description Classification: Full Thickness Without Exposed Support Structu Exudate Amount: Medium Exudate Type: Serosanguineous Exudate Color: red, brown res Foul Odor After Cleansing: No Slough/Fibrino Yes Wound Bed Granulation Amount: Medium (34-66%) Exposed Structure Granulation Quality: Red Fascia Exposed: No Necrotic Amount: Medium (34-66%) Fat Layer (Subcutaneous  Tissue) Exposed: Yes Necrotic Quality: Adherent Slough Tendon Exposed: No Muscle  Exposed: No Joint Exposed: No Bone Exposed: No Treatment Notes Wound #2 (Lower Leg) Wound Laterality: Left, Lateral Cleanser Soap and Water Discharge Instruction: Gently cleanse wound with antibacterial soap, rinse and pat dry prior to dressing wounds Peri-Wound Care [name], [name] ([id number]) Topical Triamcinolone Acetonide Cream, 0.1%, 15 (g) tube Discharge Instruction: Apply as directed by Camyra Vaeth. Primary Dressing Silvercel Small 2x2 (in/in) Discharge Instruction: Apply Silvercel Small 2x2 (in/in) as instructed Secondary Dressing Xtrasorb Medium 4x5 (in/in) Discharge Instruction: Apply to wound as directed. Do not cut. Secured With Compression Wrap Profore Lite LF 3 Multilayer Compression Bandaging System Discharge Instruction: Apply 3 multi-layer wrap as prescribed. Compression Stockings Add-Ons Electronic Signature(s) Signed: 04/10/2021 2:51:00 PM By: Carlene Coria RN Entered By: Carlene Coria on 04/09/2021 10:45:35 [name] ([id number]) -------------------------------------------------------------------------------- Vitals Details Patient Name: [name] Date of Service: 04/09/2021 10:15 AM Medical Record Number: [id number] Patient Account Number: [id number] Date of Birth/Sex: Feb 21, 1946 (75 y.o. F) Treating RN: Carlene Coria Primary Care Danthony Kendrix: [name] Other Clinician: Referring Darletta Noblett: [name] Treating Aria Pickrell/Extender: Skipper Cliche in Treatment: 11 Vital Signs Time Taken: 10:44 Temperature (F): 98.3 Height (in): 69 Pulse (bpm): 62 Weight (lbs): 244 Respiratory Rate (breaths/min): 18 Body Mass Index (BMI): 36 Blood Pressure (mmHg): 134/74 Reference Range: 80 - 120 mg / dl Electronic Signature(s) Signed: 04/10/2021 2:51:00 PM By: Carlene Coria RN Entered By: Carlene Coria on 04/09/2021 10:44:43

## 2021-04-09 NOTE — Progress Notes (Addendum)
[name], [name] ([id number]) Visit Report for 04/09/2021 Chief Complaint Document Details Patient Name: [name], [name] Date of Service: 04/09/2021 10:15 AM Medical Record Number: [id number] Patient Account Number: [id number] Date of Birth/Sex: 01/15/1946 (75 y.o. F) Treating RN: Carlene Coria Primary Care Provider: [name] Other Clinician: Referring Provider: [name] Treating Provider/Extender: Skipper Cliche in Treatment: 11 Information Obtained from: Patient Chief Complaint Left LE Ulcer Electronic Signature(s) Signed: 04/09/2021 10:38:18 AM By: Worthy Keeler PA-C Entered By: Worthy Keeler on 04/09/2021 10:38:18 [name] ([id number]) -------------------------------------------------------------------------------- HPI Details Patient Name: [name] Date of Service: 04/09/2021 10:15 AM Medical Record Number: [id number] Patient Account Number: [id number] Date of Birth/Sex: Dec 15, 1945 (75 y.o. F) Treating RN: Carlene Coria Primary Care Provider: [name] Other Clinician: Referring Provider: [name] Treating Provider/Extender: Skipper Cliche in Treatment: 11 History of Present Illness HPI Description: 03/15/2020 upon evaluation today patient presents today for initial evaluation here in our clinic concerning a wound that is on the left posterior lower extremity. Unfortunately this has been giving the patient some discomfort at this point which she notes has been affected her pretty much daily. The patient does have a history of venous insufficiency, hypertension, congestive heart failure, and a history of breast cancer. Fortunately there does not appear to be evidence of active infection at this time which is great news. No fevers, chills, nausea, vomiting, or diarrhea. Wound is not extremely large but does have some slough covering the surface of the wound. This is going require sharp debridement today. 03/15/2020 upon evaluation today patient  appears to be doing fairly well in regard to her wound. She does have some slough noted on the surface of the wound currently but I do believe the Iodoflex has been beneficial over the past week. There is no signs of active infection at this time which is great news and overall very pleased with the progress. No fevers, chills, nausea, vomiting, or diarrhea. 04/02/2020 upon evaluation today patient appears to be doing a little better in regard to her wound size wise this is not tremendously smaller she does have some slough buildup on the surface of the wound. With that being said this is going require some sharp debridement today to clear away some of the necrotic debris. Fortunately there is no evidence of active infection at this time. No fevers, chills, nausea, vomiting, or diarrhea. 04/10/2020 on evaluation today patient appears to be doing well with regard to her ulcer which is measuring somewhat smaller today. She actually has more granulation tissue noted which is also good news is no need for sharp debridement today. Fortunately there is no evidence of infection either which is also excellent. No fevers, chills, nausea, vomiting, or diarrhea. 04/26/2020 on evaluation today patient's wound actually is showing signs of good improvement which is great news. There does not appear to be any evidence of active infection. I do believe she is tolerating the collagen at this point. 05/03/2020 upon evaluation today patient's wound actually showed signs of good granulation at this time there does not appear to be any evidence of active infection which is great news and overall very pleased with where things stand. With that being said I do believe that the patient is can require some sharp debridement today but fortunately nothing too significant. 05/11/20 on evaluation today patient appears to be doing well in regard to her leg ulcer. She's been tolerating the dressing changes without complication.  Fortunately there is no signs of active infection at this time.  Overall I'm very pleased with where things stand. 05/18/2020 upon evaluation today patient's wound is showing signs of improvement is very dry and I think the collagen for the most part just dried to the wound bed. I am going to clean this away with sharp debridement in order to get this under control in my opinion. 05/25/2020 upon evaluation today patient actually appears to be doing well in regard to her leg ulcer. In fact upon inspection it appears she could potentially be completely healed but that is not guaranteed based on what we are seeing. There does not appear to be any signs of active infection which is great news. 05/31/2020 on evaluation today patient appears to be doing well with regard to her wounds. She in fact appears to be almost completely healed on the left leg there is just a very small opening remaining point 1 06/08/2020 upon evaluation today patient appears to be doing excellent in regard to her leg ulcer on the left in fact it appears to be that she is completely healed as of today. I see no signs of active infection at this time which is great news. No fevers, chills, nausea, vomiting, or diarrhea. READMISSION 01/16/2021 Patient that we have had in this clinic with a wound on the left posterior calf in the summer 2021 extending into October. She was felt to have chronic venous insufficiency. Per the patient's description she was discharged with what sounds like external compression stockings although she could not afford the "$75 per leg". She therefore did not get anything. Apparently her wound that she has currently is been in existence since January. She has been followed at vein and vascular with Unna boots and I think calcium alginate. She had ABIs done on 06/25/2020 that showed an noncompressible ABI on the right leg and the left leg but triphasic waveforms with good great toe pressures and a TBI of 0.90 on the  right and 0.91 on the left Surprisingly the patient also had venous reflux studies that were really unremarkable. This included no evidence of DVTs or SVTs but there was no evidence of deep venous insufficiency or superficial venous insufficiency in the greater saphenous or short saphenous veins bilaterally. I would wonder about more central venous issues or perhaps this is all lymphedema 01/25/2021 upon evaluation today patient appears to be doing decently well and guard to her wound. The good news is the Iodoflex that is job she saw Dr. Dellia Nims last week for readmission and to be honest I think that this has done extremely well over that week. I do believe that she can tolerate the sharp debridement today which will be good. I think that cleaning off the wound will likely allow [place] to be able to use a different type of dressing I do not think the Iodoflex will even be necessary based on how clean the wound looks. Fortunately there is no sign of active infection at this time which is great news. No fevers, chills, nausea, vomiting, or diarrhea. 02/12/2021 upon evaluation today patient appears to be doing well with regard to her leg ulcer. We did switch to alginate when she came for nurse visit I think is doing much better for her. Fortunately there does not appear to be any signs of active infection at this time which is great news. No fevers, chills, nausea, vomiting, or diarrhea. [name], [name] ([id number]) 02/18/2021 upon evaluation today patient's wound is actually showing signs of good improvement. I am very pleased with where things stand  currently. I do not see any signs of active infection which is great news and overall I feel like she is making good progress. The patient likewise is happy that things are doing so well and that this is measuring somewhat smaller. 02/25/2021 upon evaluation today patient actually appears to be doing quite well in regard to her wounds. She has been tolerating the  dressing changes without complication. Fortunately there does not appear to be any signs of active infection which is great news and overall I am extremely pleased with where things stand. No fevers, chills, nausea, vomiting, or diarrhea. She does have a lot of edema I really do think she needs compression socks to be worn in order to prevent things from causing additional issues for her currently. Especially on the right leg she should be wearing compression socks all the time to be honest. 03/04/2021 upon evaluation today patient's leg ulcer actually appears to be doing pretty well which is great news. There does not appear to be any significant change but overall the appearance is better even though the size is not necessarily reflecting a great improvement. Fortunately there does not appear to be any signs of active infection. No fevers, chills, nausea, vomiting, or diarrhea. 03/12/2021 upon evaluation today patient appears to be doing well with regard to her wounds. She has been tolerating the dressing changes without complication. The good news is that the wound on her left lateral leg is doing much better and is showing signs of good improvement. Overall her swelling is controlled well as well. She does need some compression socks she plans to go Jefferey socks next week. 03/18/2021 on evaluation today patient's wound actually is showing signs of good improvement. She does have a little bit of slough this can require some sharp debridement today. 03/25/2021 upon evaluation today patient appears to be doing well with regard to her wound on the left lateral leg. She has been tolerating the dressing changes without complication. Fortunately there does not appear to be any signs of active infection at this time. No fever chills no 04/01/2021 upon evaluation today patient's wound is showing signs of improvement and overall very pleased with where things stand at this point. There is no evidence of active  infection which is great news as well and in general I think that she is making great progress. 04/09/2021 upon evaluation today patient appears to be doing well with regard to her ankle ulcer. She is making good progress currently which is great news. There does not appear to be any signs of infection also excellent news. In general I am extremely pleased with where things stand today. No fevers, chills, nausea, vomiting, or diarrhea. Electronic Signature(s) Signed: 04/09/2021 6:11:36 PM By: Worthy Keeler PA-C Entered By: Worthy Keeler on 04/09/2021 18:11:36 [name] ([id number]) -------------------------------------------------------------------------------- Physical Exam Details Patient Name: [name] Date of Service: 04/09/2021 10:15 AM Medical Record Number: [id number] Patient Account Number: [id number] Date of Birth/Sex: [birth year] (75 y.o. F) Treating RN: Carlene Coria Primary Care Provider: [name] Other Clinician: Referring Provider: [name] Treating Provider/Extender: Skipper Cliche in Treatment: 11 Constitutional Obese and well-hydrated in no acute distress. Respiratory normal breathing without difficulty. Psychiatric this patient is able to make decisions and demonstrates good insight into disease process. Alert and Oriented x 3. pleasant and cooperative. Notes Upon inspection patient's wound bed actually showed signs of good granulation epithelization at this point. There does not appear to be any signs of active infection which is  great news and overall I am extremely pleased with where we stand today. No fevers, chills, nausea, vomiting, or diarrhea. Electronic Signature(s) Signed: 04/09/2021 6:12:17 PM By: Worthy Keeler PA-C Entered By: Worthy Keeler on 04/09/2021 18:12:17 [name] ([id number]) -------------------------------------------------------------------------------- Physician Orders Details Patient Name: [name] Date of Service: 04/09/2021 10:15 AM Medical Record Number: [id number] Patient Account Number: [id number] Date of Birth/Sex: Jan 04, 1946 (75 y.o. 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 Bathing/ Shower/ Hygiene o May shower with wound dressing protected with water repellent cover or cast protector. Edema Control - Lymphedema / Segmental Compressive Device / Other Left Lower Extremity o Optional: One layer of unna paste to top of compression wrap (to act as an anchor). o Tubigrip single layer applied. - RIGHT LEG tubi grip applied "E" until you get your compression sock use this and it's hand washable o Patient to wear own compression stockings. Remove compression stockings every night before going to bed and put on every morning when getting up. - on RIGHT LEG call [employer] for compression sock 15-22 mm/Hg, measurements and phone number on blue sheet [employer] on [employer] in [place] also has the compression socks o Elevate, Exercise Daily and Avoid Standing for Long Periods of Time. o Elevate legs to the level of the heart and pump ankles as often as possible o Elevate leg(s) parallel to the floor when sitting. o Compression Pump: Use compression pump on left lower extremity for 60 minutes, twice daily. - You may pump over wraps o Compression Pump: Use compression pump on right lower extremity for 60 minutes, twice daily. - You may pump over  wraps Additional Orders / Instructions Wound #2 Left,Lateral Lower Leg o Follow Nutritious Diet and Increase Protein Intake - Increase protein intake. Wound Treatment Wound #2 - Lower Leg Wound Laterality: Left, Lateral Cleanser: Soap and Water 1 x Per Week/30 Days Discharge Instructions: Gently cleanse wound with antibacterial soap, rinse and pat dry prior to dressing wounds Topical: Triamcinolone Acetonide Cream, 0.1%, 15 (g) tube 1 x Per Week/30 Days Discharge Instructions: Apply as directed by provider. Primary Dressing: Silvercel Small 2x2 (in/in) 1 x Per Week/30 Days Discharge Instructions: Apply Silvercel Small 2x2 (in/in) as instructed Secondary Dressing: Xtrasorb Medium 4x5 (in/in) 1 x Per Week/30 Days Discharge Instructions: Apply to wound as directed. Do not cut. Compression Wrap: Profore Lite LF 3 Multilayer Compression Bandaging System 1 x Per Week/30 Days Discharge Instructions: Apply 3 multi-layer wrap as prescribed. Electronic Signature(s) Signed: 04/09/2021 6:20:17 PM By: Worthy Keeler PA-C Signed: 04/10/2021 2:51:00 PM By: Carlene Coria RN Entered By: Carlene Coria on 04/09/2021 11:07:40 [name], [name] ([id number]) [name], [name] ([id number]) -------------------------------------------------------------------------------- Problem List Details Patient Name: [name] Date of Service: 04/09/2021 10:15 AM Medical Record Number: [id number] Patient Account Number: [id number] Date of Birth/Sex: [birth year] (75 y.o. F) Treating RN: Carlene Coria Primary Care Provider: [name] Other Clinician: Referring Provider: [name] Treating Provider/Extender: Skipper Cliche in Treatment: 11 Active Problems ICD-10 Encounter Code Description Active Date MDM Diagnosis I89.0 Lymphedema, not elsewhere classified 01/16/2021 No Yes I87.332 Chronic venous hypertension (  idiopathic) with ulcer and inflammation of 01/16/2021 No Yes left lower extremity L97.828 Non-pressure  chronic ulcer of other part of left lower leg with other 01/16/2021 No Yes specified severity Inactive Problems Resolved Problems Electronic Signature(s) Signed: 04/09/2021 10:38:09 AM By: Worthy Keeler PA-C Entered By: Worthy Keeler on 04/09/2021 10:38:09 [name] ([id number]) -------------------------------------------------------------------------------- Progress Note Details Patient Name: [name] Date of Service: 04/09/2021 10:15 AM Medical Record Number: [id number] Patient Account Number: [id number] Date of Birth/Sex: 24-Nov-1945 (75 y.o. F) Treating RN: Carlene Coria Primary Care Provider: [name] Other Clinician: Referring Provider: [name] Treating Provider/Extender: Skipper Cliche in Treatment: 11 Subjective Chief Complaint Information obtained from Patient Left LE Ulcer History of Present Illness (HPI) 03/15/2020 upon evaluation today patient presents today for initial evaluation here in our clinic concerning a wound that is on the left posterior lower extremity. Unfortunately this has been giving the patient some discomfort at this point which she notes has been affected her pretty much daily. The patient does have a history of venous insufficiency, hypertension, congestive heart failure, and a history of breast cancer. Fortunately there does not appear to be evidence of active infection at this time which is great news. No fevers, chills, nausea, vomiting, or diarrhea. Wound is not extremely large but does have some slough covering the surface of the wound. This is going require sharp debridement today. 03/15/2020 upon evaluation today patient appears to be doing fairly well in regard to her wound. She does have some slough noted on the surface of the wound currently but I do believe the Iodoflex has been beneficial over the past week. There is no signs of active infection at this time which is great news and overall very pleased with the progress. No  fevers, chills, nausea, vomiting, or diarrhea. 04/02/2020 upon evaluation today patient appears to be doing a little better in regard to her wound size wise this is not tremendously smaller she does have some slough buildup on the surface of the wound. With that being said this is going require some sharp debridement today to clear away some of the necrotic debris. Fortunately there is no evidence of active infection at this time. No fevers, chills, nausea, vomiting, or diarrhea. 04/10/2020 on evaluation today patient appears to be doing well with regard to her ulcer which is measuring somewhat smaller today. She actually has more granulation tissue noted which is also good news is no need for sharp debridement today. Fortunately there is no evidence of infection either which is also excellent. No fevers, chills, nausea, vomiting, or diarrhea. 04/26/2020 on evaluation today patient's wound actually is showing signs of good improvement which is great news. There does not appear to be any evidence of active infection. I do believe she is tolerating the collagen at this point. 05/03/2020 upon evaluation today patient's wound actually showed signs of good granulation at this time there does not appear to be any evidence of active infection which is great news and overall very pleased with where things stand. With that being said I do believe that the patient is can require some sharp debridement today but fortunately nothing too significant. 05/11/20 on evaluation today patient appears to be doing well in regard to her leg ulcer. She's been tolerating the dressing changes without complication. Fortunately there is no signs of active infection at this time. Overall I'm very pleased with where things stand. 05/18/2020 upon evaluation today patient's wound is showing signs of improvement is very dry and I  think the collagen for the most part just dried to the wound bed. I am going to clean this away with sharp  debridement in order to get this under control in my opinion. 05/25/2020 upon evaluation today patient actually appears to be doing well in regard to her leg ulcer. In fact upon inspection it appears she could potentially be completely healed but that is not guaranteed based on what we are seeing. There does not appear to be any signs of active infection which is great news. 05/31/2020 on evaluation today patient appears to be doing well with regard to her wounds. She in fact appears to be almost completely healed on the left leg there is just a very small opening remaining point 1 06/08/2020 upon evaluation today patient appears to be doing excellent in regard to her leg ulcer on the left in fact it appears to be that she is completely healed as of today. I see no signs of active infection at this time which is great news. No fevers, chills, nausea, vomiting, or diarrhea. READMISSION 01/16/2021 Patient that we have had in this clinic with a wound on the left posterior calf in the summer 2021 extending into October. She was felt to have chronic venous insufficiency. Per the patient's description she was discharged with what sounds like external compression stockings although she could not afford the "$75 per leg". She therefore did not get anything. Apparently her wound that she has currently is been in existence since January. She has been followed at vein and vascular with Unna boots and I think calcium alginate. She had ABIs done on 06/25/2020 that showed an noncompressible ABI on the right leg and the left leg but triphasic waveforms with good great toe pressures and a TBI of 0.90 on the right and 0.91 on the left Surprisingly the patient also had venous reflux studies that were really unremarkable. This included no evidence of DVTs or SVTs but there was no evidence of deep venous insufficiency or superficial venous insufficiency in the greater saphenous or short saphenous veins bilaterally. I  would wonder about more central venous issues or perhaps this is all lymphedema 01/25/2021 upon evaluation today patient appears to be doing decently well and guard to her wound. The good news is the Iodoflex that is job she saw Dr. Dellia Nims last week for readmission and to be honest I think that this has done extremely well over that week. I do believe that she can tolerate the sharp debridement today which will be good. I think that cleaning off the wound will likely allow [place] to be able to use a different type of dressing I do not think the Iodoflex will even be necessary based on how clean the wound looks. Fortunately there is no sign of active infection at this time which is great news. No fevers, chills, nausea, vomiting, or diarrhea. [name], [name] ([id number]) 02/12/2021 upon evaluation today patient appears to be doing well with regard to her leg ulcer. We did switch to alginate when she came for nurse visit I think is doing much better for her. Fortunately there does not appear to be any signs of active infection at this time which is great news. No fevers, chills, nausea, vomiting, or diarrhea. 02/18/2021 upon evaluation today patient's wound is actually showing signs of good improvement. I am very pleased with where things stand currently. I do not see any signs of active infection which is great news and overall I feel like she is making good  progress. The patient likewise is happy that things are doing so well and that this is measuring somewhat smaller. 02/25/2021 upon evaluation today patient actually appears to be doing quite well in regard to her wounds. She has been tolerating the dressing changes without complication. Fortunately there does not appear to be any signs of active infection which is great news and overall I am extremely pleased with where things stand. No fevers, chills, nausea, vomiting, or diarrhea. She does have a lot of edema I really do think she needs compression  socks to be worn in order to prevent things from causing additional issues for her currently. Especially on the right leg she should be wearing compression socks all the time to be honest. 03/04/2021 upon evaluation today patient's leg ulcer actually appears to be doing pretty well which is great news. There does not appear to be any significant change but overall the appearance is better even though the size is not necessarily reflecting a great improvement. Fortunately there does not appear to be any signs of active infection. No fevers, chills, nausea, vomiting, or diarrhea. 03/12/2021 upon evaluation today patient appears to be doing well with regard to her wounds. She has been tolerating the dressing changes without complication. The good news is that the wound on her left lateral leg is doing much better and is showing signs of good improvement. Overall her swelling is controlled well as well. She does need some compression socks she plans to go Jefferey socks next week. 03/18/2021 on evaluation today patient's wound actually is showing signs of good improvement. She does have a little bit of slough this can require some sharp debridement today. 03/25/2021 upon evaluation today patient appears to be doing well with regard to her wound on the left lateral leg. She has been tolerating the dressing changes without complication. Fortunately there does not appear to be any signs of active infection at this time. No fever chills no 04/01/2021 upon evaluation today patient's wound is showing signs of improvement and overall very pleased with where things stand at this point. There is no evidence of active infection which is great news as well and in general I think that she is making great progress. 04/09/2021 upon evaluation today patient appears to be doing well with regard to her ankle ulcer. She is making good progress currently which is great news. There does not appear to be any signs of infection also  excellent news. In general I am extremely pleased with where things stand today. No fevers, chills, nausea, vomiting, or diarrhea. Objective Constitutional Obese and well-hydrated in no acute distress. Vitals Time Taken: 10:44 AM, Height: 69 in, Weight: 244 lbs, BMI: 36, Temperature: 98.3 F, Pulse: 62 bpm, Respiratory Rate: 18 breaths/min, Blood Pressure: 134/74 mmHg. Respiratory normal breathing without difficulty. Psychiatric this patient is able to make decisions and demonstrates good insight into disease process. Alert and Oriented x 3. pleasant and cooperative. General Notes: Upon inspection patient's wound bed actually showed signs of good granulation epithelization at this point. There does not appear to be any signs of active infection which is great news and overall I am extremely pleased with where we stand today. No fevers, chills, nausea, vomiting, or diarrhea. Integumentary (Hair, Skin) Wound #2 status is Open. Original cause of wound was Gradually Appeared. The date acquired was: 09/08/2020. The wound has been in treatment 11 weeks. The wound is located on the Left,Lateral Lower Leg. The wound measures 2.5cm length x 3cm width x 0.2cm depth;  5.89cm^2 area and 1.178cm^3 volume. There is Fat Layer (Subcutaneous Tissue) exposed. There is no tunneling or undermining noted. There is a medium amount of serosanguineous drainage noted. There is medium (34-66%) red granulation within the wound bed. There is a medium (34-66%) amount of necrotic tissue within the wound bed including Adherent Slough. 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 There was a Three Layer Compression Therapy Procedure by Carlene Coria, RN. Post procedure Diagnosis Wound #2: Same as Pre-Procedure Plan Follow-up Appointments: Return Appointment in 1 week. Bathing/ Shower/ Hygiene: May shower with wound dressing protected with water repellent cover or cast protector. Edema Control - Lymphedema / Segmental Compressive Device / Other: Optional: One layer of unna paste to top of compression wrap (to act as an anchor). Tubigrip single layer applied. - RIGHT LEG tubi grip applied "E" until you get your compression sock use this and it's hand washable Patient to wear own compression stockings. Remove compression stockings every night before going to bed and put on every morning when getting up. - on RIGHT LEG call [facility] for compression sock 15-22 mm/Hg, measurements and phone number on blue sheet [employer] on [employer] in [place] also has the compression socks Elevate, Exercise Daily and Avoid Standing for Long Periods of Time. Elevate legs to the level of the heart and pump ankles as often as possible Elevate leg(s) parallel to the floor when sitting. Compression Pump: Use compression pump on left lower extremity for 60 minutes, twice daily. - You may pump over wraps Compression Pump: Use compression pump on right lower extremity for 60 minutes, twice daily. - You may pump over wraps Additional Orders / Instructions: Wound #2 Left,Lateral Lower Leg: Follow Nutritious Diet and Increase Protein Intake - Increase protein intake. WOUND #2: - Lower Leg Wound Laterality: Left, Lateral Cleanser: Soap and Water 1 x Per Week/30 Days Discharge Instructions: Gently cleanse wound with antibacterial soap, rinse and pat dry prior to dressing wounds Topical: Triamcinolone Acetonide Cream, 0.1%, 15 (g) tube 1 x Per Week/30 Days Discharge Instructions: Apply as directed by provider. Primary Dressing: Silvercel Small 2x2 (in/in) 1 x Per  Week/30 Days Discharge Instructions: Apply Silvercel Small 2x2 (in/in) as instructed Secondary Dressing: Xtrasorb Medium 4x5 (in/in) 1 x Per Week/30 Days Discharge Instructions: Apply to wound as directed. Do not cut. Compression Wrap: Profore Lite LF 3 Multilayer Compression Bandaging System 1 x Per Week/30 Days Discharge Instructions: Apply 3 multi-layer wrap as prescribed. 1. Would recommend that we going continue with wound care measures as before as the patient does seem to be doing well this is taking its time but nonetheless does seem to be showing signs of improvement each time I see her I am pleased in that regard. 2. I am also can recommend the patient continue with the triamcinolone to the leg followed by silver cell and XtraSorb as the specific directions. 3. I am also can recommend the 3 layer compression wrap be continued. We will see patient back for reevaluation in 1 week here in the clinic. If anything worsens or changes patient will contact our office for additional recommendations. Electronic Signature(s) Signed: 04/09/2021 6:12:46 PM By: Worthy Keeler PA-C Entered By: Melburn Hake,  Toney Difatta on 04/09/2021 18:12:46 [name], [name] ([id number]) -------------------------------------------------------------------------------- SuperBill Details Patient Name: [name], [name] Date of Service: 04/09/2021 Medical Record Number: [id number] Patient Account Number: [id number] Date of Birth/Sex: 12/01/1945 (75 y.o. F) Treating RN: Carlene Coria Primary Care Provider: [name] Other Clinician: Referring Provider: [name] Treating Provider/Extender: Skipper Cliche in Treatment: 11 Diagnosis Coding ICD-10 Codes Code Description I89.0 Lymphedema, not elsewhere classified I87.332 Chronic venous hypertension (idiopathic) with ulcer and inflammation of left lower extremity L97.828 Non-pressure chronic ulcer of other part of left lower leg with other specified severity Facility  Procedures CPT4 Code: [id number] Description: (Facility Use Only) [phone] - [place] LWR LT LEG Modifier: Quantity: 1 Physician Procedures CPT4 Code Description: [id number] - WC PHYS LEVEL 3 - EST PT Modifier: Quantity: 1 CPT4 Code Description: ICD-10 Diagnosis Description I89.0 Lymphedema, not elsewhere classified I87.332 Chronic venous hypertension (idiopathic) with ulcer and inflammation of le L97.828 Non-pressure chronic ulcer of other part of left lower leg with  other spec Modifier: ft lower extremity ified severity Quantity: Electronic Signature(s) Signed: 04/09/2021 6:13:14 PM By: Worthy Keeler PA-C Entered By: Worthy Keeler on 04/09/2021 18:13:14

## 2021-04-16 ENCOUNTER — Other Ambulatory Visit: Payer: Self-pay

## 2021-04-16 DIAGNOSIS — I87332 Chronic venous hypertension (idiopathic) with ulcer and inflammation of left lower extremity: Secondary | ICD-10-CM | POA: Diagnosis not present

## 2021-04-16 NOTE — Progress Notes (Signed)
[name], [name] ([id number]) Visit Report for 04/16/2021 Arrival Information Details Patient Name: [name], [name] Date of Service: 04/16/2021 11:30 AM Medical Record Number: [id number] Patient Account Number: [id number] Date of Birth/Sex: 1946/01/14 (75 y.o. F) Treating RN: Carlene Coria Primary Care Mikaeel Petrow: [name] Other Clinician: Referring Zenaya Ulatowski: [name] Treating Ason Heslin/Extender: Skipper Cliche in Treatment: 12 Visit Information History Since Last Visit All ordered tests and consults were completed: No Patient Arrived: Wheel Chair Added or deleted any medications: No Arrival Time: 11:54 Any new allergies or adverse reactions: No Accompanied By: self Had a fall or experienced change in No Transfer Assistance: None activities of daily living that may affect Patient Identification Verified: Yes risk of falls: Secondary Verification Process Completed: Yes Signs or symptoms of abuse/neglect since last visito No Patient Requires Transmission-Based Precautions: No Hospitalized since last visit: No Patient Has Alerts: Yes Implantable device outside of the clinic excluding No Patient Alerts: NOT DIABETIC cellular tissue based products placed in the center since last visit: Has Dressing in Place as Prescribed: Yes Has Compression in Place as Prescribed: Yes Pain Present Now: No Electronic Signature(s) Signed: 04/16/2021 11:55:00 AM By: Carlene Coria RN Entered By: Carlene Coria on 04/16/2021 11:55:00 [name] ([id number]) -------------------------------------------------------------------------------- Clinic Level of Care Assessment Details Patient Name: [name] Date of Service: 04/16/2021 11:30 AM Medical Record Number: [id number] Patient Account Number: [id number] Date of Birth/Sex: 1946-01-05 (75 y.o. F) Treating RN: Carlene Coria Primary Care Aidynn Polendo: [name] Other Clinician: Referring Laketra Bowdish: [name] Treating Bary Limbach/Extender:  Skipper Cliche in Treatment: 12 Clinic Level of Care Assessment Items TOOL 1 Quantity Score '[]'$  - Use when EandM and Procedure is performed on INITIAL visit 0 ASSESSMENTS - Nursing Assessment / Reassessment '[]'$  - General Physical Exam (combine w/ comprehensive assessment (listed just below) when performed on new 0 pt. evals) '[]'$  - 0 Comprehensive Assessment (HX, ROS, Risk Assessments, Wounds Hx, etc.) ASSESSMENTS - Wound and Skin Assessment / Reassessment '[]'$  - Dermatologic / Skin Assessment (not related to wound area) 0 ASSESSMENTS - Ostomy and/or Continence Assessment and Care '[]'$  - Incontinence Assessment and Management 0 '[]'$  - 0 Ostomy Care Assessment and Management (repouching, etc.) PROCESS - Coordination of Care '[]'$  - Simple Patient / Family Education for ongoing care 0 '[]'$  - 0 Complex (extensive) Patient / Family Education for ongoing care '[]'$  - 0 Staff obtains Programmer, systems, Records, Test Results / Process Orders '[]'$  - 0 Staff telephones HHA, Nursing Homes / Clarify orders / etc '[]'$  - 0 Routine Transfer to another Facility (non-emergent condition) '[]'$  - 0 Routine Hospital Admission (non-emergent condition) '[]'$  - 0 New Admissions / Biomedical engineer / Ordering NPWT, Apligraf, etc. '[]'$  - 0 Emergency Hospital Admission (emergent condition) PROCESS - Special Needs '[]'$  - Pediatric / Minor Patient Management 0 '[]'$  - 0 Isolation Patient Management '[]'$  - 0 Hearing / Language / Visual special needs '[]'$  - 0 Assessment of Community assistance (transportation, D/C planning, etc.) '[]'$  - 0 Additional assistance / Altered mentation '[]'$  - 0 Support Surface(s) Assessment (bed, cushion, seat, etc.) INTERVENTIONS - Miscellaneous '[]'$  - External ear exam 0 '[]'$  - 0 Patient Transfer (multiple staff / Civil Service fast streamer / Similar devices) '[]'$  - 0 Simple Staple / Suture removal (25 or less) '[]'$  - 0 Complex Staple / Suture removal (26 or more) '[]'$  - 0 Hypo/Hyperglycemic Management (do not check if billed  separately) '[]'$  - 0 Ankle / Brachial Index (ABI) - do not check if billed separately Has the patient been seen at the hospital within the  last three years: Yes Total Score: 0 Level Of Care: ____ [name] ([id number]) Electronic Signature(s) Unsigned Entered By: Carlene Coria on 04/16/2021 11:56:34 Signature(s): Date(s): [name] ([id number]) -------------------------------------------------------------------------------- Compression Therapy Details Patient Name: JAYLEENE, THORSON Date of Service: 04/16/2021 11:30 AM Medical Record Number: [id number] Patient Account Number: [id number] Date of Birth/Sex: 09/27/1945 (75 y.o. F) Treating RN: Carlene Coria Primary Care Yarisbel Miranda: [name] Other Clinician: Referring Andreka Stucki: [name] Treating Lakeasha Petion/Extender: Skipper Cliche in Treatment: 12 Compression Therapy Performed for Wound Assessment: Wound #2 Left,Lateral Lower Leg Performed By: Clinician Carlene Coria, RN Compression Type: Three Layer Electronic Signature(s) Signed: 04/16/2021 11:55:41 AM By: Carlene Coria RN Entered By: Carlene Coria on 04/16/2021 11:55:40 [name] ([id number]) -------------------------------------------------------------------------------- Encounter Discharge Information Details Patient Name: [name] Date of Service: 04/16/2021 11:30 AM Medical Record Number: [id number] Patient Account Number: [id number] Date of Birth/Sex: [birth year] (75 y.o. F) Treating RN: Carlene Coria Primary Care Rella Egelston: [name] Other Clinician: Referring Renleigh Ouellet: [name] Treating Lavita Pontius/Extender: Skipper Cliche in Treatment: 12 Encounter Discharge Information Items Discharge Condition: Stable Ambulatory Status: Wheelchair Discharge Destination: Home Transportation: Private Auto Accompanied By: self Schedule Follow-up Appointment: Yes Clinical Summary of Care: Patient Declined Electronic Signature(s) Signed: 04/16/2021 11:56:23  AM By: Carlene Coria RN Entered By: Carlene Coria on 04/16/2021 11:56:23 [name] ([id number]) -------------------------------------------------------------------------------- Wound Assessment Details Patient Name: [name] Date of Service: 04/16/2021 11:30 AM Medical Record Number: [id number] Patient Account Number: [id number] Date of Birth/Sex: 17-Sep-1945 (75 y.o. F) Treating RN: Carlene Coria Primary Care Donnis Pecha: [name] Other Clinician: Referring Denvil Canning: [name] Treating Lang Zingg/Extender: Skipper Cliche in Treatment: 12 Wound Status Wound Number: 2 Primary Venous Leg Ulcer Etiology: Wound Location: Left, Lateral Lower Leg Wound Open Wounding Event: Gradually Appeared Status: Date Acquired: 09/08/2020 Comorbid Cataracts, Lymphedema, Sleep Apnea, Congestive Heart Weeks Of Treatment: 12 History: Failure, Hypertension, Osteoarthritis, Neuropathy, Clustered Wound: No Received Chemotherapy, Received Radiation Wound Measurements Length: (cm) 2.6 Width: (cm) 3 Depth: (cm) 0.2 Area: (cm) 6.126 Volume: (cm) 1.225 % Reduction in Area: -30% % Reduction in Volume: -160.1% Epithelialization: Medium (34-66%) Tunneling: No Undermining: No Wound Description Classification: Full Thickness Without Exposed Support Structu Exudate Amount: Medium Exudate Type: Serosanguineous Exudate Color: red, brown res Foul Odor After Cleansing: No Slough/Fibrino Yes Wound Bed Granulation Amount: Medium (34-66%) Exposed Structure Granulation Quality: Red Fascia Exposed: No Necrotic Amount: Medium (34-66%) Fat Layer (Subcutaneous Tissue) Exposed: Yes Necrotic Quality: Adherent Slough Tendon Exposed: No Muscle Exposed: No Joint Exposed: No Bone Exposed: No Treatment Notes Wound #2 (Lower Leg) Wound Laterality: Left, Lateral Cleanser Soap and Water Discharge Instruction: Gently cleanse wound with antibacterial soap, rinse and pat dry prior to dressing  wounds Peri-Wound Care Topical Triamcinolone Acetonide Cream, 0.1%, 15 (g) tube Discharge Instruction: Apply as directed by Keya Wynes. Primary Dressing Silvercel Small 2x2 (in/in) Discharge Instruction: Apply Silvercel Small 2x2 (in/in) as instructed Secondary Dressing Xtrasorb Medium 4x5 (in/in) Discharge Instruction: Apply to wound as directed. Do not cut. Secured With [place], [name] ([id number]) Compression Wrap Profore Lite LF 3 Multilayer Compression Bandaging System Discharge Instruction: Apply 3 multi-layer wrap as prescribed. Compression Stockings Add-Ons Electronic Signature(s) Signed: 04/16/2021 11:55:21 AM By: Carlene Coria RN Entered By: Carlene Coria on 04/16/2021 11:55:21

## 2021-04-23 ENCOUNTER — Other Ambulatory Visit: Payer: Self-pay

## 2021-04-23 ENCOUNTER — Encounter: Payer: Medicare Other | Admitting: Physician Assistant

## 2021-04-23 DIAGNOSIS — I87332 Chronic venous hypertension (idiopathic) with ulcer and inflammation of left lower extremity: Secondary | ICD-10-CM | POA: Diagnosis not present

## 2021-04-23 NOTE — Progress Notes (Signed)
NUSRAT, ENCARNACION ([id number]) Visit Report for 04/23/2021 Arrival Information Details Patient Name: SHAKERIA, [name] Date of Service: 04/23/2021 12:30 PM Medical Record Number: [id number] Patient Account Number: [id number] Date of Birth/Sex: 03/23/1946 (75 y.o. F) Treating RN: Carlene Coria Primary Care Ayomide Purdy: [name] Other Clinician: Referring Damond Borchers: [name] Treating Jodeen Mclin/Extender: Skipper Cliche in Treatment: 13 Visit Information History Since Last Visit All ordered tests and consults were completed: No Patient Arrived: Wheel Chair Added or deleted any medications: No Arrival Time: 12:37 Any new allergies or adverse reactions: No Accompanied By: self Had a fall or experienced change in No Transfer Assistance: None activities of daily living that may affect Patient Identification Verified: Yes risk of falls: Secondary Verification Process Completed: Yes Signs or symptoms of abuse/neglect since last visito No Patient Requires Transmission-Based Precautions: No Hospitalized since last visit: No Patient Has Alerts: Yes Implantable device outside of the clinic excluding No Patient Alerts: NOT DIABETIC cellular tissue based products placed in the center since last visit: Has Dressing in Place as Prescribed: Yes Has Compression in Place as Prescribed: Yes Pain Present Now: No Electronic Signature(s) Signed: 04/23/2021 5:27:33 PM By: Carlene Coria RN Entered By: Carlene Coria on 04/23/2021 12:42:39 [name] ([id number]) -------------------------------------------------------------------------------- Clinic Level of Care Assessment Details Patient Name: [name] Date of Service: 04/23/2021 12:30 PM Medical Record Number: [id number] Patient Account Number: [id number] Date of Birth/Sex: 12/02/1945 (75 y.o. F) Treating RN: Carlene Coria Primary Care Giara Mcgaughey: [name] Other Clinician: Referring Presly Steinruck: [name] Treating Greysin Medlen/Extender:  Skipper Cliche in Treatment: 13 Clinic Level of Care Assessment Items TOOL 1 Quantity Score _0  - Use when EandM and Procedure is performed on INITIAL visit 0 ASSESSMENTS - Nursing Assessment / Reassessment _1  - General Physical Exam (combine w/ comprehensive assessment (listed just below) when performed on new 0 pt. evals) _2  - 0 Comprehensive Assessment (HX, ROS, Risk Assessments, Wounds Hx, etc.) ASSESSMENTS - Wound and Skin Assessment / Reassessment _3  - Dermatologic / Skin Assessment (not related to wound area) 0 ASSESSMENTS - Ostomy and/or Continence Assessment and Care _4  - Incontinence Assessment and Management 0 _5  - 0 Ostomy Care Assessment and Management (repouching, etc.) PROCESS - Coordination of Care _6  - Simple Patient / Family Education for ongoing care 0 _7  - 0 Complex (extensive) Patient / Family Education for ongoing care _8  - 0 Staff obtains Programmer, systems, Records, Test Results / Process Orders _9  - 0 Staff telephones HHA, Nursing Homes / Clarify orders / etc _10  - 0 Routine Transfer to another Facility (non-emergent condition) _11  - 0 Routine Hospital Admission (non-emergent condition) _12  - 0 New Admissions / Biomedical engineer / Ordering NPWT, Apligraf, etc. _13  - 0 Emergency Hospital Admission (emergent condition) PROCESS - Special Needs _14  - Pediatric / Minor Patient Management 0 _15  - 0 Isolation Patient Management _16  - 0 Hearing / Language / Visual special needs _17  - 0 Assessment of Community assistance (transportation, D/C planning, etc.) _18  - 0 Additional assistance / Altered mentation _19  - 0 Support Surface(s) Assessment (bed, cushion, seat, etc.) INTERVENTIONS - Miscellaneous _20  - External ear exam 0 _21  - 0 Patient Transfer (multiple staff / Civil Service fast streamer / Similar devices) _22  - 0 Simple Staple / Suture removal (25 or less) _23  - 0 Complex Staple / Suture removal (26 or more) _24  - 0 Hypo/Hyperglycemic Management (do not check if billed  separately) _25  - 0 Ankle / Brachial Index (ABI) - do not check if billed separately Has the patient been seen at the hospital within the  last three years: Yes Total Score: 0 Level Of Care: ____ [name] ([id number]) Electronic Signature(s) Signed: 04/23/2021 5:27:33 PM By: Carlene Coria RN Entered By: Carlene Coria on 04/23/2021 13:07:25 [name] ([id number]) -------------------------------------------------------------------------------- Compression Therapy Details Patient Name: [name] Date of Service: 04/23/2021 12:30 PM Medical Record Number: [id number] Patient Account Number: [id number] Date of Birth/Sex: 1946/07/14 (75 y.o. F) Treating RN: Carlene Coria Primary Care Reeve Turnley: [name] Other Clinician: Referring Levon Penning: [name] Treating Lael Wetherbee/Extender: Skipper Cliche in Treatment: 13 Compression Therapy Performed for Wound Assessment: Wound #2 Left,Lateral Lower Leg Performed By: Clinician Carlene Coria, RN Compression Type: Three Layer Post Procedure Diagnosis Same as Pre-procedure Electronic Signature(s) Signed: 04/23/2021 5:27:33 PM By: Carlene Coria RN Entered By: Carlene Coria on 04/23/2021 13:19:52 [name] ([id number]) -------------------------------------------------------------------------------- Encounter Discharge Information Details Patient Name: [name] Date of Service: 04/23/2021 12:30 PM Medical Record Number: [id number] Patient Account Number: [id number] Date of Birth/Sex: 1946-07-28 (75 y.o. F) Treating RN: Carlene Coria Primary Care Gwenda Heiner: [name] Other Clinician: Referring Nayquan Evinger: [name] Treating [place] Pettengill/Extender: Skipper Cliche in Treatment: 13 Encounter Discharge Information Items Post Procedure Vitals Discharge Condition: Stable Temperature (F): 98.3 Ambulatory Status: Wheelchair Pulse (bpm): 62 Discharge Destination: Home Respiratory Rate (breaths/min): 18 Transportation:  Private Auto Blood Pressure (mmHg): 134/74 Accompanied By: self Schedule Follow-up Appointment: Yes Clinical Summary of Care: Patient Declined Electronic Signature(s) Signed: 04/23/2021 5:27:33 PM By: Carlene Coria RN Entered By: Carlene Coria on 04/23/2021 13:19:27 [name] ([id number]) -------------------------------------------------------------------------------- Lower Extremity Assessment Details Patient Name: [name] Date of Service: 04/23/2021 12:30 PM Medical Record Number: [id number] Patient Account Number: [id number] Date of Birth/Sex: 1946-01-27 (75 y.o. F) Treating RN: Carlene Coria Primary Care Rowan Blaker: [name] Other Clinician: Referring Liron Eissler: [name] Treating Shandra Szymborski/Extender: Skipper Cliche in Treatment: 13 Edema Assessment Assessed: [Left: No] [Right: No] Edema: [Left: Ye] [Right: s] Calf Left: Right: Point of Measurement: 27 cm From Medial Instep 38 cm Ankle Left: Right: Point of Measurement: 10 cm From Medial Instep 25 cm Vascular Assessment Pulses: Dorsalis Pedis Palpable: [Left:Yes] Electronic Signature(s) Signed: 04/23/2021 5:27:33 PM By: Carlene Coria RN Entered By: Carlene Coria on 04/23/2021 12:49:12 [name] ([id number]) -------------------------------------------------------------------------------- Multi Wound Chart Details Patient Name: [name] Date of Service: 04/23/2021 12:30 PM Medical Record Number: [id number] Patient Account Number: [id number] Date of Birth/Sex: [birth year] (75 y.o. F) Treating RN: Carlene Coria Primary Care Brookie Wayment: [name] Other Clinician: Referring Carime Dinkel: [name] Treating Annalea Alguire/Extender: Skipper Cliche in Treatment: 13 Vital Signs Height(in): 69 Pulse(bpm): 80 Weight(lbs): 244 Blood Pressure(mmHg): 130/80 Body Mass Index(BMI): 36 Temperature(F): 98.2 Respiratory Rate(breaths/min): 18 Photos: [N/A:N/A] Wound Location: Left, Lateral Lower Leg N/A  N/A Wounding Event: Gradually Appeared N/A N/A Primary Etiology: Venous Leg Ulcer N/A N/A Comorbid History: Cataracts, Lymphedema, Sleep N/A N/A Apnea, Congestive Heart Failure, Hypertension, Osteoarthritis, Neuropathy, Received Chemotherapy, Received Radiation Date Acquired: 09/08/2020 N/A N/A Weeks of Treatment: 13 N/A N/A Wound Status: Open N/A N/A Measurements L x W x D (cm) 2.3x3x0.2 N/A N/A Area (cm) : 5.419 N/A N/A Volume (cm) : 1.084 N/A N/A % Reduction in Area: -15.00% N/A N/A % Reduction in Volume: -130.10% N/A N/A Classification: Full Thickness Without Exposed N/A N/A Support Structures Exudate Amount: Medium N/A N/A Exudate Type: Serosanguineous N/A N/A Exudate Color: red, brown N/A N/A Granulation Amount: Medium (34-66%) N/A N/A Granulation Quality: Red N/A N/A Necrotic Amount: Medium (34-66%) N/A N/A Exposed Structures: Fat Layer (Subcutaneous Tissue): N/A N/A Yes Fascia: No Tendon: No Muscle: No Joint: No Bone: No Epithelialization:  Medium (34-66%) N/A N/A Treatment Notes Electronic Signature(s) Signed: 04/23/2021 5:27:33 PM By: Carlene Coria RN Entered By: Carlene Coria on 04/23/2021 12:50:37 [name] ([id number]) -------------------------------------------------------------------------------- Multi-Disciplinary Care Plan Details Patient Name: [name] Date of Service: 04/23/2021 12:30 PM Medical Record Number: [id number] Patient Account Number: [id number] Date of Birth/Sex: 07/08/1946 (75 y.o. F) Treating RN: Carlene Coria Primary Care Eliz Nigg: [name] Other Clinician: Referring Adena Sima: [name] Treating Phyillis Dascoli/Extender: Skipper Cliche in Treatment: 13 Active Inactive Wound/Skin Impairment Nursing Diagnoses: Impaired tissue integrity Goals: Patient/caregiver will verbalize understanding of skin care regimen Date Initiated: 01/16/2021 Date Inactivated: 02/18/2021 Target Resolution Date: 02/16/2021 Goal Status:  Met Ulcer/skin breakdown will have a volume reduction of 30% by week 4 Date Initiated: 01/16/2021 Date Inactivated: 02/18/2021 Target Resolution Date: 02/16/2021 Goal Status: Met Ulcer/skin breakdown will have a volume reduction of 50% by week 8 Date Initiated: 01/16/2021 Date Inactivated: 04/01/2021 Target Resolution Date: 03/18/2021 Goal Status: Unmet Unmet Reason: comorbities Ulcer/skin breakdown will have a volume reduction of 80% by week 12 Date Initiated: 04/01/2021 Date Inactivated: 04/23/2021 Target Resolution Date: 04/18/2021 Goal Status: Unmet Unmet Reason: comorbities Ulcer/skin breakdown will heal within 14 weeks Date Initiated: 04/01/2021 Target Resolution Date: 05/19/2021 Goal Status: Active Interventions: Assess ulceration(s) every visit Treatment Activities: Topical wound management initiated : 01/16/2021 Notes: Electronic Signature(s) Signed: 04/23/2021 5:27:33 PM By: Carlene Coria RN Entered By: Carlene Coria on 04/23/2021 12:50:25 [name] ([id number]) -------------------------------------------------------------------------------- Pain Assessment Details Patient Name: [name] Date of Service: 04/23/2021 12:30 PM Medical Record Number: [id number] Patient Account Number: [id number] Date of Birth/Sex: 1946-06-25 (75 y.o. F) Treating RN: Carlene Coria Primary Care Natasha Paulson: [name] Other Clinician: Referring Jassica Zazueta: [name] Treating Darrion Macaulay/Extender: Skipper Cliche in Treatment: 13 Active Problems Location of Pain Severity and Description of Pain Patient Has Paino No Site Locations Pain Management and Medication Current Pain Management: Electronic Signature(s) Signed: 04/23/2021 5:27:33 PM By: Carlene Coria RN Entered By: Carlene Coria on 04/23/2021 12:43:22 [name] ([id number]) -------------------------------------------------------------------------------- Patient/Caregiver Education Details Patient Name: [name] Date of Service: 04/23/2021 12:30 PM Medical Record Number: [id number] Patient Account Number: [id number] Date of Birth/Gender: [birth year] (75 y.o. F) Treating RN: Carlene Coria Primary Care Physician: [name] Other Clinician: Referring Physician: [name] Treating Physician/Extender: Skipper Cliche in Treatment: 13 Education Assessment Education Provided To: Patient Education Topics Provided Wound/Skin Impairment: Methods: Explain/Verbal Responses: State content correctly Electronic Signature(s) Signed: 04/23/2021 5:27:33 PM By: Carlene Coria RN Entered By: Carlene Coria on 04/23/2021 13:18:41 [name] ([id number]) -------------------------------------------------------------------------------- Wound Assessment Details Patient Name: [name] Date of Service: 04/23/2021 12:30 PM Medical Record Number: [id number] Patient Account Number: [id number] Date of Birth/Sex: [birth year] (75 y.o. F) Treating RN: Carlene Coria Primary Care Drey Shaff: [name] Other Clinician: Referring Marla Pouliot: [name] Treating Burnadette Baskett/Extender: Skipper Cliche in Treatment: 13 Wound Status Wound Number: 2 Primary Venous Leg Ulcer Etiology: Wound Location: Left, Lateral Lower Leg Wound Open Wounding Event: Gradually Appeared Status: Date Acquired: 09/08/2020 Comorbid Cataracts, Lymphedema, Sleep Apnea, Congestive Heart Weeks Of Treatment: 13 History: Failure, Hypertension, Osteoarthritis, Neuropathy, Clustered Wound: No Received Chemotherapy, Received Radiation Photos Wound Measurements Length: (cm) 2.3 Width: (cm) 3 Depth: (cm) 0.2 Area: (cm) 5.419 Volume: (cm) 1.084 % Reduction in Area: -15% % Reduction in Volume: -130.1% Epithelialization: Medium (34-66%) Tunneling: No Undermining: No Wound Description Classification: Full Thickness Without Exposed Support Structu Exudate Amount: Medium Exudate Type: Serosanguineous Exudate Color: red,  brown res Foul Odor After Cleansing: No Slough/Fibrino Yes Wound Bed Granulation Amount: Medium (34-66%) Exposed Structure  Granulation Quality: Red Fascia Exposed: No Necrotic Amount: Medium (34-66%) Fat Layer (Subcutaneous Tissue) Exposed: Yes Necrotic Quality: Adherent Slough Tendon Exposed: No Muscle Exposed: No Joint Exposed: No Bone Exposed: No Treatment Notes Wound #2 (Lower Leg) Wound Laterality: Left, Lateral Cleanser Soap and Water Discharge Instruction: Gently cleanse wound with antibacterial soap, rinse and pat dry prior to dressing wounds Peri-Wound Care VONNETTA, AKEY ([id number]) Topical Triamcinolone Acetonide Cream, 0.1%, 15 (g) tube Discharge Instruction: Apply as directed by Alexxus Sobh. Primary Dressing Silvercel Small 2x2 (in/in) Discharge Instruction: Apply Silvercel Small 2x2 (in/in) as instructed Secondary Dressing Xtrasorb Medium 4x5 (in/in) Discharge Instruction: Apply to wound as directed. Do not cut. Secured With Compression Wrap Profore Lite LF 3 Multilayer Compression Bandaging System Discharge Instruction: Apply 3 multi-layer wrap as prescribed. Compression Stockings Add-Ons Electronic Signature(s) Signed: 04/23/2021 5:27:33 PM By: Carlene Coria RN Entered By: Carlene Coria on 04/23/2021 12:48:29 [name] ([id number]) -------------------------------------------------------------------------------- [place] Details Patient Name: [name] Date of Service: 04/23/2021 12:30 PM Medical Record Number: [id number] Patient Account Number: [id number] Date of Birth/Sex: [birth year] (75 y.o. F) Treating RN: Carlene Coria Primary Care Bruna Dills: [name] Other Clinician: Referring Jaymison Luber: [name] Treating Tijuan Dantes/Extender: Skipper Cliche in Treatment: 13 Vital Signs Time Taken: 12:42 Temperature (F): 98.2 Height (in): 69 Pulse (bpm): 80 Weight (lbs): 244 Respiratory Rate (breaths/min): 18 Body Mass Index (BMI): 36 Blood  Pressure (mmHg): 130/80 Reference Range: 80 - 120 mg / dl Electronic Signature(s) Signed: 04/23/2021 5:27:33 PM By: Carlene Coria RN Entered By: Carlene Coria on 04/23/2021 12:43:13

## 2021-04-23 NOTE — Progress Notes (Addendum)
[name], [name] ([id number]) Visit Report for 04/23/2021 Chief Complaint Document Details Patient Name: [name], [name] Date of Service: 04/23/2021 12:30 PM Medical Record Number: [id number] Patient Account Number: [id number] Date of Birth/Sex: 10/20/1945 (75 y.o. F) Treating RN: Carlene Coria Primary Care Provider: [name] Other Clinician: Referring Provider: [name] Treating Provider/Extender: Skipper Cliche in Treatment: 13 Information Obtained from: Patient Chief Complaint Left LE Ulcer Electronic Signature(s) Signed: 04/23/2021 12:51:38 PM By: Worthy Keeler PA-C Entered By: Worthy Keeler on 04/23/2021 12:51:38 [name] ([id number]) -------------------------------------------------------------------------------- Debridement Details Patient Name: [name] Date of Service: 04/23/2021 12:30 PM Medical Record Number: [id number] Patient Account Number: [id number] Date of Birth/Sex: 22-Oct-1945 (75 y.o. F) Treating RN: Carlene Coria Primary Care Provider: [name] Other Clinician: Referring Provider: [name] Treating Provider/Extender: Skipper Cliche in Treatment: 13 Debridement Performed for Wound #2 Left,Lateral Lower Leg Assessment: Performed By: Physician Tommie Sams., PA-C Debridement Type: Debridement Severity of Tissue Pre Debridement: Fat layer exposed Level of Consciousness (Pre- Awake and Alert procedure): Pre-procedure Verification/Time Out Yes - 13:04 Taken: Start Time: 13:04 Pain Control: Lidocaine 4% Topical Solution Total Area Debrided (L x W): 2.3 (cm) x 3 (cm) = 6.9 (cm) Tissue and other material Viable, Non-Viable, Slough, Subcutaneous, Skin: Dermis , Skin: Epidermis, Slough debrided: Level: Skin/Subcutaneous Tissue Debridement Description: Excisional Instrument: Curette Bleeding: Moderate Hemostasis Achieved: Pressure End Time: 13:06 Procedural Pain: 0 Post Procedural Pain: 0 Response to Treatment:  Procedure was tolerated well Level of Consciousness (Post- Awake and Alert procedure): Post Debridement Measurements of Total Wound Length: (cm) 2.3 Width: (cm) 3 Depth: (cm) 0.2 Volume: (cm) 1.084 Character of Wound/Ulcer Post Debridement: Improved Severity of Tissue Post Debridement: Fat layer exposed Post Procedure Diagnosis Same as Pre-procedure Electronic Signature(s) Signed: 04/23/2021 5:27:33 PM By: Carlene Coria RN Signed: 04/25/2021 4:48:07 PM By: Worthy Keeler PA-C Entered By: Carlene Coria on 04/23/2021 13:07:14 [name] ([id number]) -------------------------------------------------------------------------------- HPI Details Patient Name: [name] Date of Service: 04/23/2021 12:30 PM Medical Record Number: [id number] Patient Account Number: [id number] Date of Birth/Sex: [birth year] (75 y.o. F) Treating RN: Carlene Coria Primary Care Provider: [name] Other Clinician: Referring Provider: [name] Treating Provider/Extender: Skipper Cliche in Treatment: 13 History of Present Illness HPI Description: 03/15/2020 upon evaluation today patient presents today for initial evaluation here in our clinic concerning a wound that is on the left posterior lower extremity. Unfortunately this has been giving the patient some discomfort at this point which she notes has been affected her pretty much daily. The patient does have a history of venous insufficiency, hypertension, congestive heart failure, and a history of breast cancer. Fortunately there does not appear to be evidence of active infection at this time which is great news. No fevers, chills, nausea, vomiting, or diarrhea. Wound is not extremely large but does have some slough covering the surface of the wound. This is going require sharp debridement today. 03/15/2020 upon evaluation today patient appears to be doing fairly well in regard to her wound. She does have some slough noted on the surface of the  wound currently but I do believe the Iodoflex has been beneficial over the past week. There is no signs of active infection at this time which is great news and overall very pleased with the progress. No fevers, chills, nausea, vomiting, or diarrhea. 04/02/2020 upon evaluation today patient appears to be doing a little better in regard to her wound size wise this is not tremendously smaller she does have some slough buildup on  the surface of the wound. With that being said this is going require some sharp debridement today to clear away some of the necrotic debris. Fortunately there is no evidence of active infection at this time. No fevers, chills, nausea, vomiting, or diarrhea. 04/10/2020 on evaluation today patient appears to be doing well with regard to her ulcer which is measuring somewhat smaller today. She actually has more granulation tissue noted which is also good news is no need for sharp debridement today. Fortunately there is no evidence of infection either which is also excellent. No fevers, chills, nausea, vomiting, or diarrhea. 04/26/2020 on evaluation today patient's wound actually is showing signs of good improvement which is great news. There does not appear to be any evidence of active infection. I do believe she is tolerating the collagen at this point. 05/03/2020 upon evaluation today patient's wound actually showed signs of good granulation at this time there does not appear to be any evidence of active infection which is great news and overall very pleased with where things stand. With that being said I do believe that the patient is can require some sharp debridement today but fortunately nothing too significant. 05/11/20 on evaluation today patient appears to be doing well in regard to her leg ulcer. She's been tolerating the dressing changes without complication. Fortunately there is no signs of active infection at this time. Overall I'm very pleased with where things  stand. 05/18/2020 upon evaluation today patient's wound is showing signs of improvement is very dry and I think the collagen for the most part just dried to the wound bed. I am going to clean this away with sharp debridement in order to get this under control in my opinion. 05/25/2020 upon evaluation today patient actually appears to be doing well in regard to her leg ulcer. In fact upon inspection it appears she could potentially be completely healed but that is not guaranteed based on what we are seeing. There does not appear to be any signs of active infection which is great news. 05/31/2020 on evaluation today patient appears to be doing well with regard to her wounds. She in fact appears to be almost completely healed on the left leg there is just a very small opening remaining point 1 06/08/2020 upon evaluation today patient appears to be doing excellent in regard to her leg ulcer on the left in fact it appears to be that she is completely healed as of today. I see no signs of active infection at this time which is great news. No fevers, chills, nausea, vomiting, or diarrhea. READMISSION 01/16/2021 Patient that we have had in this clinic with a wound on the left posterior calf in the summer 2021 extending into October. She was felt to have chronic venous insufficiency. Per the patient's description she was discharged with what sounds like external compression stockings although she could not afford the "$75 per leg". She therefore did not get anything. Apparently her wound that she has currently is been in existence since January. She has been followed at vein and vascular with Unna boots and I think calcium alginate. She had ABIs done on 06/25/2020 that showed an noncompressible ABI on the right leg and the left leg but triphasic waveforms with good great toe pressures and a TBI of 0.90 on the right and 0.91 on the left Surprisingly the patient also had venous reflux studies that were really  unremarkable. This included no evidence of DVTs or SVTs but there was no evidence of deep  venous insufficiency or superficial venous insufficiency in the greater saphenous or short saphenous veins bilaterally. I would wonder about more central venous issues or perhaps this is all lymphedema 01/25/2021 upon evaluation today patient appears to be doing decently well and guard to her wound. The good news is the Iodoflex that is job she saw Dr. Dellia Nims last week for readmission and to be honest I think that this has done extremely well over that week. I do believe that she can tolerate the sharp debridement today which will be good. I think that cleaning off the wound will likely allow [place] to be able to use a different type of dressing I do not think the Iodoflex will even be necessary based on how clean the wound looks. Fortunately there is no sign of active infection at this time which is great news. No fevers, chills, nausea, vomiting, or diarrhea. 02/12/2021 upon evaluation today patient appears to be doing well with regard to her leg ulcer. We did switch to alginate when she came for nurse visit I think is doing much better for her. Fortunately there does not appear to be any signs of active infection at this time which is great news. No fevers, chills, nausea, vomiting, or diarrhea. [name], [name] ([id number]) 02/18/2021 upon evaluation today patient's wound is actually showing signs of good improvement. I am very pleased with where things stand currently. I do not see any signs of active infection which is great news and overall I feel like she is making good progress. The patient likewise is happy that things are doing so well and that this is measuring somewhat smaller. 02/25/2021 upon evaluation today patient actually appears to be doing quite well in regard to her wounds. She has been tolerating the dressing changes without complication. Fortunately there does not appear to be any signs of active  infection which is great news and overall I am extremely pleased with where things stand. No fevers, chills, nausea, vomiting, or diarrhea. She does have a lot of edema I really do think she needs compression socks to be worn in order to prevent things from causing additional issues for her currently. Especially on the right leg she should be wearing compression socks all the time to be honest. 03/04/2021 upon evaluation today patient's leg ulcer actually appears to be doing pretty well which is great news. There does not appear to be any significant change but overall the appearance is better even though the size is not necessarily reflecting a great improvement. Fortunately there does not appear to be any signs of active infection. No fevers, chills, nausea, vomiting, or diarrhea. 03/12/2021 upon evaluation today patient appears to be doing well with regard to her wounds. She has been tolerating the dressing changes without complication. The good news is that the wound on her left lateral leg is doing much better and is showing signs of good improvement. Overall her swelling is controlled well as well. She does need some compression socks she plans to go Jefferey socks next week. 03/18/2021 on evaluation today patient's wound actually is showing signs of good improvement. She does have a little bit of slough this can require some sharp debridement today. 03/25/2021 upon evaluation today patient appears to be doing well with regard to her wound on the left lateral leg. She has been tolerating the dressing changes without complication. Fortunately there does not appear to be any signs of active infection at this time. No fever chills no 04/01/2021 upon evaluation today patient's wound  is showing signs of improvement and overall very pleased with where things stand at this point. There is no evidence of active infection which is great news as well and in general I think that she is making great  progress. 04/09/2021 upon evaluation today patient appears to be doing well with regard to her ankle ulcer. She is making good progress currently which is great news. There does not appear to be any signs of infection also excellent news. In general I am extremely pleased with where things stand today. No fevers, chills, nausea, vomiting, or diarrhea. 04/23/2021 upon inspection today patient appears to be doing quite well with regard to her leg ulcer. She has been tolerating the dressing changes without complication. She is can require some sharp debridement today but overall seems to be doing excellent. Electronic Signature(s) Signed: 04/23/2021 5:26:24 PM By: Worthy Keeler PA-C Entered By: Worthy Keeler on 04/23/2021 17:26:24 [name], [name] ([id number]) -------------------------------------------------------------------------------- Physical Exam Details Patient Name: [name] Date of Service: 04/23/2021 12:30 PM Medical Record Number: [id number] Patient Account Number: [id number] Date of Birth/Sex: 09-13-1945 (75 y.o. F) Treating RN: Carlene Coria Primary Care Provider: [name] Other Clinician: Referring Provider: [name] Treating Provider/Extender: Skipper Cliche in Treatment: 70 Constitutional Well-nourished and well-hydrated in no acute distress. Respiratory normal breathing without difficulty. Psychiatric this patient is able to make decisions and demonstrates good insight into disease process. Alert and Oriented x 3. pleasant and cooperative. Notes Patient's wound bed did require sharp debridement clear away some of the necrotic debris on the surface of the wound. With that being said she tolerated this debridement today without complication and postdebridement wound bed appears to be doing significantly better which is great news. The wound overall is smaller in size. Electronic Signature(s) Signed: 04/23/2021 5:26:44 PM By: Worthy Keeler PA-C Entered  By: Worthy Keeler on 04/23/2021 17:26:44 [name] ([id number]) -------------------------------------------------------------------------------- Physician Orders Details Patient Name: [name] Date of Service: 04/23/2021 12:30 PM Medical Record Number: [id number] Patient Account Number: [id number] Date of Birth/Sex: 10/07/1945 (75 y.o. F) Treating RN: Carlene Coria Primary Care Provider: [name] Other Clinician: Referring Provider: [name] Treating Provider/Extender: Skipper Cliche in Treatment: 27 Verbal / Phone Orders: No Diagnosis Coding ICD-10 Coding Code Description I89.0 Lymphedema, not elsewhere classified I87.332 Chronic venous hypertension (idiopathic) with ulcer and inflammation of left lower extremity L97.828 Non-pressure chronic ulcer of other part of left lower leg with other specified severity Follow-up Appointments o Return Appointment in 1 week. Bathing/ Shower/ Hygiene o May shower with wound dressing protected with water repellent cover or cast protector. Edema Control - Lymphedema / Segmental Compressive Device / Other Left Lower Extremity o Optional: One layer of unna paste to top of compression wrap (to act as an anchor). o Tubigrip single layer applied. - RIGHT LEG tubi grip applied "E" until you get your compression sock use this and it's hand washable o Patient to wear own compression stockings. Remove compression stockings every night before going to bed and put on every morning when getting up. - on RIGHT LEG call [facility] for compression sock 15-22 mm/Hg, measurements and phone number on blue sheet [employer] on [employer] in [place] also has the compression socks o Elevate, Exercise Daily and Avoid Standing for Long Periods of Time. o Elevate legs to the level of the heart and pump ankles as often as possible o Elevate leg(s) parallel to the floor when sitting. o Compression Pump: Use  compression pump on  left lower extremity for 60 minutes, twice daily. - You may pump over wraps o Compression Pump: Use compression pump on right lower extremity for 60 minutes, twice daily. - You may pump over wraps Additional Orders / Instructions Wound #2 Left,Lateral Lower Leg o Follow Nutritious Diet and Increase Protein Intake - Increase protein intake. Wound Treatment Wound #2 - Lower Leg Wound Laterality: Left, Lateral Cleanser: Soap and Water 1 x Per Week/30 Days Discharge Instructions: Gently cleanse wound with antibacterial soap, rinse and pat dry prior to dressing wounds Topical: Triamcinolone Acetonide Cream, 0.1%, 15 (g) tube 1 x Per Week/30 Days Discharge Instructions: Apply as directed by provider. Primary Dressing: Silvercel Small 2x2 (in/in) 1 x Per Week/30 Days Discharge Instructions: Apply Silvercel Small 2x2 (in/in) as instructed Secondary Dressing: Xtrasorb Medium 4x5 (in/in) 1 x Per Week/30 Days Discharge Instructions: Apply to wound as directed. Do not cut. Compression Wrap: Profore Lite LF 3 Multilayer Compression Bandaging System 1 x Per Week/30 Days Discharge Instructions: Apply 3 multi-layer wrap as prescribed. Electronic Signature(s) Signed: 04/23/2021 5:27:33 PM By: Carlene Coria RN Signed: 04/25/2021 4:48:07 PM By: Worthy Keeler PA-C Entered By: Carlene Coria on 04/23/2021 13:05:42 [name], [name] ([id number]) [name], [name] ([id number]) -------------------------------------------------------------------------------- Problem List Details Patient Name: [name] Date of Service: 04/23/2021 12:30 PM Medical Record Number: [id number] Patient Account Number: [id number] Date of Birth/Sex: 05/22/1946 (75 y.o. F) Treating RN: Carlene Coria Primary Care Provider: [name] Other Clinician: Referring Provider: [name] Treating Provider/Extender: Skipper Cliche in Treatment: 13 Active Problems ICD-10 Encounter Code Description Active Date  MDM Diagnosis I89.0 Lymphedema, not elsewhere classified 01/16/2021 No Yes I87.332 Chronic venous hypertension (idiopathic) with ulcer and inflammation of 01/16/2021 No Yes left lower extremity L97.828 Non-pressure chronic ulcer of other part of left lower leg with other 01/16/2021 No Yes specified severity Inactive Problems Resolved Problems Electronic Signature(s) Signed: 04/23/2021 12:51:23 PM By: Worthy Keeler PA-C Entered By: Worthy Keeler on 04/23/2021 12:51:22 [name] ([id number]) -------------------------------------------------------------------------------- Progress Note Details Patient Name: [name] Date of Service: 04/23/2021 12:30 PM Medical Record Number: [id number] Patient Account Number: [id number] Date of Birth/Sex: 08/14/1946 (75 y.o. F) Treating RN: Carlene Coria Primary Care Provider: [name] Other Clinician: Referring Provider: [name] Treating Provider/Extender: Skipper Cliche in Treatment: 13 Subjective Chief Complaint Information obtained from Patient Left LE Ulcer History of Present Illness (HPI) 03/15/2020 upon evaluation today patient presents today for initial evaluation here in our clinic concerning a wound that is on the left posterior lower extremity. Unfortunately this has been giving the patient some discomfort at this point which she notes has been affected her pretty much daily. The patient does have a history of venous insufficiency, hypertension, congestive heart failure, and a history of breast cancer. Fortunately there does not appear to be evidence of active infection at this time which is great news. No fevers, chills, nausea, vomiting, or diarrhea. Wound is not extremely large but does have some slough covering the surface of the wound. This is going require sharp debridement today. 03/15/2020 upon evaluation today patient appears to be doing fairly well in regard to her wound. She does have some slough noted on the  surface of the wound currently but I do believe the Iodoflex has been beneficial over the past week. There is no signs of active infection at this time which is great news and overall very pleased with the progress. No fevers, chills, nausea, vomiting, or diarrhea. 04/02/2020 upon evaluation today patient appears to be doing  a little better in regard to her wound size wise this is not tremendously smaller she does have some slough buildup on the surface of the wound. With that being said this is going require some sharp debridement today to clear away some of the necrotic debris. Fortunately there is no evidence of active infection at this time. No fevers, chills, nausea, vomiting, or diarrhea. 04/10/2020 on evaluation today patient appears to be doing well with regard to her ulcer which is measuring somewhat smaller today. She actually has more granulation tissue noted which is also good news is no need for sharp debridement today. Fortunately there is no evidence of infection either which is also excellent. No fevers, chills, nausea, vomiting, or diarrhea. 04/26/2020 on evaluation today patient's wound actually is showing signs of good improvement which is great news. There does not appear to be any evidence of active infection. I do believe she is tolerating the collagen at this point. 05/03/2020 upon evaluation today patient's wound actually showed signs of good granulation at this time there does not appear to be any evidence of active infection which is great news and overall very pleased with where things stand. With that being said I do believe that the patient is can require some sharp debridement today but fortunately nothing too significant. 05/11/20 on evaluation today patient appears to be doing well in regard to her leg ulcer. She's been tolerating the dressing changes without complication. Fortunately there is no signs of active infection at this time. Overall I'm very pleased with where  things stand. 05/18/2020 upon evaluation today patient's wound is showing signs of improvement is very dry and I think the collagen for the most part just dried to the wound bed. I am going to clean this away with sharp debridement in order to get this under control in my opinion. 05/25/2020 upon evaluation today patient actually appears to be doing well in regard to her leg ulcer. In fact upon inspection it appears she could potentially be completely healed but that is not guaranteed based on what we are seeing. There does not appear to be any signs of active infection which is great news. 05/31/2020 on evaluation today patient appears to be doing well with regard to her wounds. She in fact appears to be almost completely healed on the left leg there is just a very small opening remaining point 1 06/08/2020 upon evaluation today patient appears to be doing excellent in regard to her leg ulcer on the left in fact it appears to be that she is completely healed as of today. I see no signs of active infection at this time which is great news. No fevers, chills, nausea, vomiting, or diarrhea. READMISSION 01/16/2021 Patient that we have had in this clinic with a wound on the left posterior calf in the summer 2021 extending into October. She was felt to have chronic venous insufficiency. Per the patient's description she was discharged with what sounds like external compression stockings although she could not afford the "$75 per leg". She therefore did not get anything. Apparently her wound that she has currently is been in existence since January. She has been followed at vein and vascular with Unna boots and I think calcium alginate. She had ABIs done on 06/25/2020 that showed an noncompressible ABI on the right leg and the left leg but triphasic waveforms with good great toe pressures and a TBI of 0.90 on the right and 0.91 on the left Surprisingly the patient also had venous  reflux studies that were  really unremarkable. This included no evidence of DVTs or SVTs but there was no evidence of deep venous insufficiency or superficial venous insufficiency in the greater saphenous or short saphenous veins bilaterally. I would wonder about more central venous issues or perhaps this is all lymphedema 01/25/2021 upon evaluation today patient appears to be doing decently well and guard to her wound. The good news is the Iodoflex that is job she saw Dr. Dellia Nims last week for readmission and to be honest I think that this has done extremely well over that week. I do believe that she can tolerate the sharp debridement today which will be good. I think that cleaning off the wound will likely allow [place] to be able to use a different type of dressing I do not think the Iodoflex will even be necessary based on how clean the wound looks. Fortunately there is no sign of active infection at this time which is great news. No fevers, chills, nausea, vomiting, or diarrhea. [name], [name] ([id number]) 02/12/2021 upon evaluation today patient appears to be doing well with regard to her leg ulcer. We did switch to alginate when she came for nurse visit I think is doing much better for her. Fortunately there does not appear to be any signs of active infection at this time which is great news. No fevers, chills, nausea, vomiting, or diarrhea. 02/18/2021 upon evaluation today patient's wound is actually showing signs of good improvement. I am very pleased with where things stand currently. I do not see any signs of active infection which is great news and overall I feel like she is making good progress. The patient likewise is happy that things are doing so well and that this is measuring somewhat smaller. 02/25/2021 upon evaluation today patient actually appears to be doing quite well in regard to her wounds. She has been tolerating the dressing changes without complication. Fortunately there does not appear to be any signs of  active infection which is great news and overall I am extremely pleased with where things stand. No fevers, chills, nausea, vomiting, or diarrhea. She does have a lot of edema I really do think she needs compression socks to be worn in order to prevent things from causing additional issues for her currently. Especially on the right leg she should be wearing compression socks all the time to be honest. 03/04/2021 upon evaluation today patient's leg ulcer actually appears to be doing pretty well which is great news. There does not appear to be any significant change but overall the appearance is better even though the size is not necessarily reflecting a great improvement. Fortunately there does not appear to be any signs of active infection. No fevers, chills, nausea, vomiting, or diarrhea. 03/12/2021 upon evaluation today patient appears to be doing well with regard to her wounds. She has been tolerating the dressing changes without complication. The good news is that the wound on her left lateral leg is doing much better and is showing signs of good improvement. Overall her swelling is controlled well as well. She does need some compression socks she plans to go Jefferey socks next week. 03/18/2021 on evaluation today patient's wound actually is showing signs of good improvement. She does have a little bit of slough this can require some sharp debridement today. 03/25/2021 upon evaluation today patient appears to be doing well with regard to her wound on the left lateral leg. She has been tolerating the dressing changes without complication. Fortunately there does  not appear to be any signs of active infection at this time. No fever chills no 04/01/2021 upon evaluation today patient's wound is showing signs of improvement and overall very pleased with where things stand at this point. There is no evidence of active infection which is great news as well and in general I think that she is making great  progress. 04/09/2021 upon evaluation today patient appears to be doing well with regard to her ankle ulcer. She is making good progress currently which is great news. There does not appear to be any signs of infection also excellent news. In general I am extremely pleased with where things stand today. No fevers, chills, nausea, vomiting, or diarrhea. 04/23/2021 upon inspection today patient appears to be doing quite well with regard to her leg ulcer. She has been tolerating the dressing changes without complication. She is can require some sharp debridement today but overall seems to be doing excellent. Objective Constitutional Well-nourished and well-hydrated in no acute distress. Vitals Time Taken: 12:42 PM, Height: 69 in, Weight: 244 lbs, BMI: 36, Temperature: 98.2 F, Pulse: 80 bpm, Respiratory Rate: 18 breaths/min, Blood Pressure: 130/80 mmHg. Respiratory normal breathing without difficulty. Psychiatric this patient is able to make decisions and demonstrates good insight into disease process. Alert and Oriented x 3. pleasant and cooperative. General Notes: Patient's wound bed did require sharp debridement clear away some of the necrotic debris on the surface of the wound. With that being said she tolerated this debridement today without complication and postdebridement wound bed appears to be doing significantly better which is great news. The wound overall is smaller in size. Integumentary (Hair, Skin) Wound #2 status is Open. Original cause of wound was Gradually Appeared. The date acquired was: 09/08/2020. The wound has been in treatment 13 weeks. The wound is located on the Left,Lateral Lower Leg. The wound measures 2.3cm length x 3cm width x 0.2cm depth; 5.419cm^2 area and 1.084cm^3 volume. There is Fat Layer (Subcutaneous Tissue) exposed. There is no tunneling or undermining noted. There is a medium amount of serosanguineous drainage noted. There is medium (34-66%) red granulation  within the wound bed. There is a medium (34-66%) amount of necrotic tissue within the wound bed including Adherent Slough. 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 There was a Excisional Skin/Subcutaneous Tissue Debridement with a total area of 6.9 sq cm performed by Tommie Sams., PA-C. With the following instrument(s): Curette to remove Viable and Non-Viable tissue/material. Material removed includes Subcutaneous Tissue, Slough, Skin: Dermis, and Skin: Epidermis after achieving pain control using Lidocaine 4% Topical Solution. No specimens were taken. A time out was conducted at 13:04, prior to the start of the procedure. A Moderate amount of bleeding was controlled with Pressure. The procedure was tolerated well with a pain level of 0 throughout and a pain level of 0 following the procedure. Post Debridement Measurements: 2.3cm length x 3cm width x 0.2cm depth; 1.084cm^3 volume. Character of Wound/Ulcer Post Debridement is improved. Severity of Tissue Post Debridement is: Fat layer exposed. Post procedure Diagnosis Wound #2: Same as Pre-Procedure Pre-procedure diagnosis of Wound #2 is a Venous Leg Ulcer located on the Left,Lateral Lower Leg .  There was a Three Layer Compression Therapy Procedure by Carlene Coria, RN. Post procedure Diagnosis Wound #2: Same as Pre-Procedure Plan Follow-up Appointments: Return Appointment in 1 week. Bathing/ Shower/ Hygiene: May shower with wound dressing protected with water repellent cover or cast protector. Edema Control - Lymphedema /  Segmental Compressive Device / Other: Optional: One layer of unna paste to top of compression wrap (to act as an anchor). Tubigrip single layer applied. - RIGHT LEG tubi grip applied "E" until you get your compression sock use this and it's hand washable Patient to wear own compression stockings. Remove compression stockings every night before going to bed and put on every morning when getting up. - on RIGHT LEG call [employer] for compression sock 15-22 mm/Hg, measurements and phone number on blue sheet [employer] on [employer] in [place] also has the compression socks Elevate, Exercise Daily and Avoid Standing for Long Periods of Time. Elevate legs to the level of the heart and pump ankles as often as possible Elevate leg(s) parallel to the floor when sitting. Compression Pump: Use compression pump on left lower extremity for 60 minutes, twice daily. - You may pump over wraps Compression Pump: Use compression pump on right lower extremity for 60 minutes, twice daily. - You may pump over wraps Additional Orders / Instructions: Wound #2 Left,Lateral Lower Leg: Follow Nutritious Diet and Increase Protein Intake - Increase protein intake. WOUND #2: - Lower Leg Wound Laterality: Left, Lateral Cleanser: Soap and Water 1 x Per Week/30 Days Discharge Instructions: Gently cleanse wound with antibacterial soap, rinse and pat dry prior to dressing wounds Topical: Triamcinolone Acetonide Cream, 0.1%, 15 (g) tube 1 x Per Week/30 Days Discharge Instructions: Apply as directed by provider. Primary Dressing: Silvercel Small 2x2 (in/in) 1 x Per Week/30 Days Discharge Instructions: Apply Silvercel Small 2x2 (in/in) as instructed Secondary Dressing: Xtrasorb Medium 4x5 (in/in) 1 x Per Week/30 Days Discharge Instructions: Apply to wound as directed. Do not cut. Compression Wrap: Profore Lite LF 3 Multilayer Compression Bandaging System 1 x Per Week/30 Days Discharge Instructions: Apply 3  multi-layer wrap as prescribed. 1. Would recommend currently that we actually go ahead and continue with the wound care measures as before and the patient is in agreement with that plan. This includes the use of the silver cell followed by XtraSorb. 2. I am also can recommend that we continue with the 3 layer compression wrap. 3. I am also can recommend the patient continue to elevate her legs as much as possible to help with edema control. [name], [name] ([id number]) We will see patient back for reevaluation in 1 week here in the clinic. If anything worsens or changes patient will contact our office for additional recommendations. Electronic Signature(s) Signed: 04/23/2021 5:27:25 PM By: Worthy Keeler PA-C Entered By: Worthy Keeler on 04/23/2021 17:27:25 [name] ([id number]) -------------------------------------------------------------------------------- SuperBill Details Patient Name: [name] Date of Service: 04/23/2021 Medical Record Number: [id number] Patient Account Number: [id number] Date of Birth/Sex: 09-27-1945 (75 y.o. F) Treating RN: Carlene Coria Primary Care Provider: [name] Other Clinician: Referring Provider: [name] Treating Provider/Extender: Skipper Cliche in Treatment: 13 Diagnosis Coding ICD-10 Codes Code Description I89.0 Lymphedema, not elsewhere classified I87.332 Chronic venous hypertension (idiopathic) with ulcer and inflammation of left lower extremity L97.828 Non-pressure chronic ulcer of other part of left lower leg with other specified severity Facility Procedures CPT4 Code: [id number] Description: 11042 - DEB SUBQ TISSUE 20 SQ CM/< Modifier: Quantity: 1 CPT4 Code: Description: ICD-10 Diagnosis Description L97.828  Non-pressure chronic ulcer of other part of left lower leg with other spec Modifier: ified severity Quantity: Physician Procedures CPT4 Code: [id number] Description: 11042 - WC PHYS SUBQ TISS 20 SQ  CM Modifier: Quantity: 1 CPT4 Code: Description: ICD-10 Diagnosis Description L97.828 Non-pressure chronic ulcer of other part of left lower leg with other spec Modifier: ified severity Quantity: Electronic Signature(s) Signed: 04/23/2021 5:28:10 PM By: Worthy Keeler PA-C Entered By: Worthy Keeler on 04/23/2021 17:28:10

## 2021-04-29 ENCOUNTER — Other Ambulatory Visit: Payer: Self-pay

## 2021-04-29 DIAGNOSIS — I87332 Chronic venous hypertension (idiopathic) with ulcer and inflammation of left lower extremity: Secondary | ICD-10-CM | POA: Diagnosis not present

## 2021-05-01 NOTE — Progress Notes (Signed)
GOLDIA, BOECK ([id number]) Visit Report for 04/29/2021 Arrival Information Details Patient Name: [name], [name] Date of Service: 04/29/2021 2:15 PM Medical Record Number: [id number] Patient Account Number: [id number] Date of Birth/Sex: 12/18/1945 (75 y.o. F) Treating RN: Carlene Coria Primary Care Misk Galentine: [name] Other Clinician: Referring Rayquon Uselman: [name] Treating Shawonda Kerce/Extender: Skipper Cliche in Treatment: 14 Visit Information History Since Last Visit All ordered tests and consults were completed: No Patient Arrived: Wheel Chair Added or deleted any medications: No Arrival Time: 15:01 Any new allergies or adverse reactions: No Accompanied By: self Had a fall or experienced change in No Transfer Assistance: None activities of daily living that may affect Patient Identification Verified: Yes risk of falls: Secondary Verification Process Completed: Yes Signs or symptoms of abuse/neglect since last visito No Patient Requires Transmission-Based Precautions: No Hospitalized since last visit: No Patient Has Alerts: Yes Implantable device outside of the clinic excluding No Patient Alerts: NOT DIABETIC cellular tissue based products placed in the center since last visit: Has Dressing in Place as Prescribed: Yes Has Compression in Place as Prescribed: Yes Pain Present Now: No Electronic Signature(s) Signed: 05/01/2021 9:51:49 AM By: Carlene Coria RN Entered By: Carlene Coria on 04/29/2021 15:02:04 [name] ([id number]) -------------------------------------------------------------------------------- Clinic Level of Care Assessment Details Patient Name: [name] Date of Service: 04/29/2021 2:15 PM Medical Record Number: [id number] Patient Account Number: [id number] Date of Birth/Sex: 03-30-1946 (75 y.o. F) Treating RN: Carlene Coria Primary Care Riyansh Gerstner: [name] Other Clinician: Referring Wardell Pokorski: [name] Treating Yousif Edelson/Extender:  Skipper Cliche in Treatment: 14 Clinic Level of Care Assessment Items TOOL 1 Quantity Score '[]'$  - Use when EandM and Procedure is performed on INITIAL visit 0 ASSESSMENTS - Nursing Assessment / Reassessment '[]'$  - General Physical Exam (combine w/ comprehensive assessment (listed just below) when performed on new 0 pt. evals) '[]'$  - 0 Comprehensive Assessment (HX, ROS, Risk Assessments, Wounds Hx, etc.) ASSESSMENTS - Wound and Skin Assessment / Reassessment '[]'$  - Dermatologic / Skin Assessment (not related to wound area) 0 ASSESSMENTS - Ostomy and/or Continence Assessment and Care '[]'$  - Incontinence Assessment and Management 0 '[]'$  - 0 Ostomy Care Assessment and Management (repouching, etc.) PROCESS - Coordination of Care '[]'$  - Simple Patient / Family Education for ongoing care 0 '[]'$  - 0 Complex (extensive) Patient / Family Education for ongoing care '[]'$  - 0 Staff obtains Programmer, systems, Records, Test Results / Process Orders '[]'$  - 0 Staff telephones HHA, Nursing Homes / Clarify orders / etc '[]'$  - 0 Routine Transfer to another Facility (non-emergent condition) '[]'$  - 0 Routine Hospital Admission (non-emergent condition) '[]'$  - 0 New Admissions / Biomedical engineer / Ordering NPWT, Apligraf, etc. '[]'$  - 0 Emergency Hospital Admission (emergent condition) PROCESS - Special Needs '[]'$  - Pediatric / Minor Patient Management 0 '[]'$  - 0 Isolation Patient Management '[]'$  - 0 Hearing / Language / Visual special needs '[]'$  - 0 Assessment of Community assistance (transportation, D/C planning, etc.) '[]'$  - 0 Additional assistance / Altered mentation '[]'$  - 0 Support Surface(s) Assessment (bed, cushion, seat, etc.) INTERVENTIONS - Miscellaneous '[]'$  - External ear exam 0 '[]'$  - 0 Patient Transfer (multiple staff / Civil Service fast streamer / Similar devices) '[]'$  - 0 Simple Staple / Suture removal (25 or less) '[]'$  - 0 Complex Staple / Suture removal (26 or more) '[]'$  - 0 Hypo/Hyperglycemic Management (do not check if billed  separately) '[]'$  - 0 Ankle / Brachial Index (ABI) - do not check if billed separately Has the patient been seen at the hospital within the  last three years: Yes Total Score: 0 Level Of Care: ____ [name] ([id number]) Electronic Signature(s) Signed: 05/01/2021 9:51:49 AM By: Carlene Coria RN Entered By: Carlene Coria on 04/29/2021 15:03:25 [name] ([id number]) -------------------------------------------------------------------------------- Compression Therapy Details Patient Name: [name] Date of Service: 04/29/2021 2:15 PM Medical Record Number: [id number] Patient Account Number: [id number] Date of Birth/Sex: [birth year] (75 y.o. F) Treating RN: Carlene Coria Primary Care Tiberius Loftus: [name] Other Clinician: Referring Tajee Savant: [name] Treating Jervon Ream/Extender: Skipper Cliche in Treatment: 14 Compression Therapy Performed for Wound Assessment: Wound #2 Left,Lateral Lower Leg Performed By: Clinician Carlene Coria, RN Compression Type: Three Layer Electronic Signature(s) Signed: 05/01/2021 9:51:49 AM By: Carlene Coria RN Entered By: Carlene Coria on 04/29/2021 15:02:50 [name] ([id number]) -------------------------------------------------------------------------------- Encounter Discharge Information Details Patient Name: [name] Date of Service: 04/29/2021 2:15 PM Medical Record Number: [id number] Patient Account Number: [id number] Date of Birth/Sex: April 09, 1946 (75 y.o. F) Treating RN: Carlene Coria Primary Care Kolette Vey: [name] Other Clinician: Referring Hildegard Hlavac: [name] Treating Nazifa Trinka/Extender: Skipper Cliche in Treatment: 14 Encounter Discharge Information Items Discharge Condition: Stable Ambulatory Status: Wheelchair Discharge Destination: Home Transportation: Private Auto Accompanied By: self Schedule Follow-up Appointment: Yes Clinical Summary of Care: Patient Declined Electronic Signature(s) Signed:  05/01/2021 9:51:49 AM By: Carlene Coria RN Entered By: Carlene Coria on 04/29/2021 15:03:18 [name] ([id number]) -------------------------------------------------------------------------------- Wound Assessment Details Patient Name: [name] Date of Service: 04/29/2021 2:15 PM Medical Record Number: [id number] Patient Account Number: [id number] Date of Birth/Sex: 1946-02-20 (75 y.o. F) Treating RN: Carlene Coria Primary Care Kenshawn Maciolek: [name] Other Clinician: Referring Jaymes Revels: [name] Treating Sugar Vanzandt/Extender: Skipper Cliche in Treatment: 14 Wound Status Wound Number: 2 Primary Venous Leg Ulcer Etiology: Wound Location: Left, Lateral Lower Leg Wound Open Wounding Event: Gradually Appeared Status: Date Acquired: 09/08/2020 Comorbid Cataracts, Lymphedema, Sleep Apnea, Congestive Heart Weeks Of Treatment: 14 History: Failure, Hypertension, Osteoarthritis, Neuropathy, Clustered Wound: No Received Chemotherapy, Received Radiation Wound Measurements Length: (cm) 2.3 Width: (cm) 3 Depth: (cm) 0.2 Area: (cm) 5.419 Volume: (cm) 1.084 % Reduction in Area: -15% % Reduction in Volume: -130.1% Epithelialization: Medium (34-66%) Tunneling: No Undermining: No Wound Description Classification: Full Thickness Without Exposed Support Structu Exudate Amount: Medium Exudate Type: Serosanguineous Exudate Color: red, brown res Foul Odor After Cleansing: No Slough/Fibrino Yes Wound Bed Granulation Amount: Medium (34-66%) Exposed Structure Granulation Quality: Red Fascia Exposed: No Necrotic Amount: Medium (34-66%) Fat Layer (Subcutaneous Tissue) Exposed: Yes Necrotic Quality: Adherent Slough Tendon Exposed: No Muscle Exposed: No Joint Exposed: No Bone Exposed: No Treatment Notes Wound #2 (Lower Leg) Wound Laterality: Left, Lateral Cleanser Soap and Water Discharge Instruction: Gently cleanse wound with antibacterial soap, rinse and pat dry prior to  dressing wounds Peri-Wound Care Topical Triamcinolone Acetonide Cream, 0.1%, 15 (g) tube Discharge Instruction: Apply as directed by Phi Avans. Primary Dressing Silvercel Small 2x2 (in/in) Discharge Instruction: Apply Silvercel Small 2x2 (in/in) as instructed Secondary Dressing Xtrasorb Medium 4x5 (in/in) Discharge Instruction: Apply to wound as directed. Do not cut. Secured With [place], [name] ([id number]) Compression Wrap Profore Lite LF 3 Multilayer Compression Bandaging System Discharge Instruction: Apply 3 multi-layer wrap as prescribed. Compression Stockings Add-Ons Electronic Signature(s) Signed: 05/01/2021 9:51:49 AM By: Carlene Coria RN Entered By: Carlene Coria on 04/29/2021 15:02:21

## 2021-05-02 ENCOUNTER — Ambulatory Visit: Payer: Medicare Other | Admitting: Physician Assistant

## 2021-05-06 ENCOUNTER — Telehealth: Payer: Self-pay | Admitting: Cardiovascular Disease

## 2021-05-06 ENCOUNTER — Encounter: Payer: Medicare Other | Admitting: Physician Assistant

## 2021-05-06 ENCOUNTER — Other Ambulatory Visit: Payer: Self-pay

## 2021-05-06 DIAGNOSIS — I87332 Chronic venous hypertension (idiopathic) with ulcer and inflammation of left lower extremity: Secondary | ICD-10-CM | POA: Diagnosis not present

## 2021-05-06 NOTE — Telephone Encounter (Signed)
Pt c/o swelling: STAT is pt has developed SOB within 24 hours  If swelling, where is the swelling located? Bilateral feet/toes up to bilateral knees  How much weight have you gained and in what time span? 20 in 2 months  Have you gained 3 pounds in a day or 5 pounds in a week? Not sure  Do you have a log of your daily weights (if so, list)? 240 to 290  Are you currently taking a fluid pill? yes  Are you currently SOB? Someitmes, but not all the time  Have you traveled recently? no

## 2021-05-06 NOTE — Progress Notes (Addendum)
VENISA, ESTUPINAN ([id number]) Visit Report for 05/06/2021 Chief Complaint Document Details Patient Name: ADUT, AEGERTER Date of Service: 05/06/2021 12:30 PM Medical Record Number: [id number] Patient Account Number: [id number] Date of Birth/Sex: 25-Sep-1945 (75 y.o. F) Treating RN: Carlene Coria Primary Care Provider: [name] Other Clinician: Referring Provider: [name] Treating Provider/Extender: Skipper Cliche in Treatment: 15 Information Obtained from: Patient Chief Complaint Left LE Ulcer Electronic Signature(s) Signed: 05/06/2021 12:53:48 PM By: Worthy Keeler PA-C Entered By: Worthy Keeler on 05/06/2021 12:53:48 [name] ([id number]) -------------------------------------------------------------------------------- HPI Details Patient Name: [name] Date of Service: 05/06/2021 12:30 PM Medical Record Number: [id number] Patient Account Number: [id number] Date of Birth/Sex: 10-May-1946 (75 y.o. F) Treating RN: Carlene Coria Primary Care Provider: [name] Other Clinician: Referring Provider: [name] Treating Provider/Extender: Skipper Cliche in Treatment: 15 History of Present Illness HPI Description: 03/15/2020 upon evaluation today patient presents today for initial evaluation here in our clinic concerning a wound that is on the left posterior lower extremity. Unfortunately this has been giving the patient some discomfort at this point which she notes has been affected her pretty much daily. The patient does have a history of venous insufficiency, hypertension, congestive heart failure, and a history of breast cancer. Fortunately there does not appear to be evidence of active infection at this time which is great news. No fevers, chills, nausea, vomiting, or diarrhea. Wound is not extremely large but does have some slough covering the surface of the wound. This is going require sharp debridement today. 03/15/2020 upon evaluation today patient  appears to be doing fairly well in regard to her wound. She does have some slough noted on the surface of the wound currently but I do believe the Iodoflex has been beneficial over the past week. There is no signs of active infection at this time which is great news and overall very pleased with the progress. No fevers, chills, nausea, vomiting, or diarrhea. 04/02/2020 upon evaluation today patient appears to be doing a little better in regard to her wound size wise this is not tremendously smaller she does have some slough buildup on the surface of the wound. With that being said this is going require some sharp debridement today to clear away some of the necrotic debris. Fortunately there is no evidence of active infection at this time. No fevers, chills, nausea, vomiting, or diarrhea. 04/10/2020 on evaluation today patient appears to be doing well with regard to her ulcer which is measuring somewhat smaller today. She actually has more granulation tissue noted which is also good news is no need for sharp debridement today. Fortunately there is no evidence of infection either which is also excellent. No fevers, chills, nausea, vomiting, or diarrhea. 04/26/2020 on evaluation today patient's wound actually is showing signs of good improvement which is great news. There does not appear to be any evidence of active infection. I do believe she is tolerating the collagen at this point. 05/03/2020 upon evaluation today patient's wound actually showed signs of good granulation at this time there does not appear to be any evidence of active infection which is great news and overall very pleased with where things stand. With that being said I do believe that the patient is can require some sharp debridement today but fortunately nothing too significant. 05/11/20 on evaluation today patient appears to be doing well in regard to her leg ulcer. She's been tolerating the dressing changes without complication.  Fortunately there is no signs of active infection at this time.  Overall I'm very pleased with where things stand. 05/18/2020 upon evaluation today patient's wound is showing signs of improvement is very dry and I think the collagen for the most part just dried to the wound bed. I am going to clean this away with sharp debridement in order to get this under control in my opinion. 05/25/2020 upon evaluation today patient actually appears to be doing well in regard to her leg ulcer. In fact upon inspection it appears she could potentially be completely healed but that is not guaranteed based on what we are seeing. There does not appear to be any signs of active infection which is great news. 05/31/2020 on evaluation today patient appears to be doing well with regard to her wounds. She in fact appears to be almost completely healed on the left leg there is just a very small opening remaining point 1 06/08/2020 upon evaluation today patient appears to be doing excellent in regard to her leg ulcer on the left in fact it appears to be that she is completely healed as of today. I see no signs of active infection at this time which is great news. No fevers, chills, nausea, vomiting, or diarrhea. READMISSION 01/16/2021 Patient that we have had in this clinic with a wound on the left posterior calf in the summer 2021 extending into October. She was felt to have chronic venous insufficiency. Per the patient's description she was discharged with what sounds like external compression stockings although she could not afford the "$75 per leg". She therefore did not get anything. Apparently her wound that she has currently is been in existence since January. She has been followed at vein and vascular with Unna boots and I think calcium alginate. She had ABIs done on 06/25/2020 that showed an noncompressible ABI on the right leg and the left leg but triphasic waveforms with good great toe pressures and a TBI of 0.90 on the  right and 0.91 on the left Surprisingly the patient also had venous reflux studies that were really unremarkable. This included no evidence of DVTs or SVTs but there was no evidence of deep venous insufficiency or superficial venous insufficiency in the greater saphenous or short saphenous veins bilaterally. I would wonder about more central venous issues or perhaps this is all lymphedema 01/25/2021 upon evaluation today patient appears to be doing decently well and guard to her wound. The good news is the Iodoflex that is job she saw Dr. Dellia Nims last week for readmission and to be honest I think that this has done extremely well over that week. I do believe that she can tolerate the sharp debridement today which will be good. I think that cleaning off the wound will likely allow [place] to be able to use a different type of dressing I do not think the Iodoflex will even be necessary based on how clean the wound looks. Fortunately there is no sign of active infection at this time which is great news. No fevers, chills, nausea, vomiting, or diarrhea. 02/12/2021 upon evaluation today patient appears to be doing well with regard to her leg ulcer. We did switch to alginate when she came for nurse visit I think is doing much better for her. Fortunately there does not appear to be any signs of active infection at this time which is great news. No fevers, chills, nausea, vomiting, or diarrhea. CENA, BRUHN ([id number]) 02/18/2021 upon evaluation today patient's wound is actually showing signs of good improvement. I am very pleased with where things stand  currently. I do not see any signs of active infection which is great news and overall I feel like she is making good progress. The patient likewise is happy that things are doing so well and that this is measuring somewhat smaller. 02/25/2021 upon evaluation today patient actually appears to be doing quite well in regard to her wounds. She has been tolerating the  dressing changes without complication. Fortunately there does not appear to be any signs of active infection which is great news and overall I am extremely pleased with where things stand. No fevers, chills, nausea, vomiting, or diarrhea. She does have a lot of edema I really do think she needs compression socks to be worn in order to prevent things from causing additional issues for her currently. Especially on the right leg she should be wearing compression socks all the time to be honest. 03/04/2021 upon evaluation today patient's leg ulcer actually appears to be doing pretty well which is great news. There does not appear to be any significant change but overall the appearance is better even though the size is not necessarily reflecting a great improvement. Fortunately there does not appear to be any signs of active infection. No fevers, chills, nausea, vomiting, or diarrhea. 03/12/2021 upon evaluation today patient appears to be doing well with regard to her wounds. She has been tolerating the dressing changes without complication. The good news is that the wound on her left lateral leg is doing much better and is showing signs of good improvement. Overall her swelling is controlled well as well. She does need some compression socks she plans to go Jefferey socks next week. 03/18/2021 on evaluation today patient's wound actually is showing signs of good improvement. She does have a little bit of slough this can require some sharp debridement today. 03/25/2021 upon evaluation today patient appears to be doing well with regard to her wound on the left lateral leg. She has been tolerating the dressing changes without complication. Fortunately there does not appear to be any signs of active infection at this time. No fever chills no 04/01/2021 upon evaluation today patient's wound is showing signs of improvement and overall very pleased with where things stand at this point. There is no evidence of active  infection which is great news as well and in general I think that she is making great progress. 04/09/2021 upon evaluation today patient appears to be doing well with regard to her ankle ulcer. She is making good progress currently which is great news. There does not appear to be any signs of infection also excellent news. In general I am extremely pleased with where things stand today. No fevers, chills, nausea, vomiting, or diarrhea. 04/23/2021 upon inspection today patient appears to be doing quite well with regard to her leg ulcer. She has been tolerating the dressing changes without complication. She is can require some sharp debridement today but overall seems to be doing excellent. 05/06/2021 upon evaluation today patient unfortunately appears to be doing poorly overall in regard to her swelling. She is actually significantly more swollen than last week despite the fact that her wound is doing better this is not a good trend. I think that she probably needs to see her cardiologist ASAP and I discussed that with her today. This includes the fact that honestly I think she probably is getting need an increase in her diuretic or something to try to clear away some of the excess fluid that she is experiencing at the moment. She is  not been doing her pumps even twice a day but again right now more concerned that if she were to do the pump she would fluid overload her lungs causing other issues as far as her breathing is concerned. Obviously I think she needs to contact them and move up the appointment for the 12th of something sooner 2 weeks is a bit too long to wait with how swollen she has. Electronic Signature(s) Signed: 05/06/2021 1:22:19 PM By: Worthy Keeler PA-C Entered By: Worthy Keeler on 05/06/2021 13:22:19 MILIKA, SHERIDAN ([id number]) -------------------------------------------------------------------------------- Physical Exam Details Patient Name: [name] Date of Service:  05/06/2021 12:30 PM Medical Record Number: [id number] Patient Account Number: [id number] Date of Birth/Sex: 1945/09/27 (75 y.o. F) Treating RN: Carlene Coria Primary Care Provider: [name] Other Clinician: Referring Provider: [name] Treating Provider/Extender: Skipper Cliche in Treatment: 15 Constitutional Obese and well-hydrated in no acute distress. Respiratory normal breathing without difficulty. Psychiatric this patient is able to make decisions and demonstrates good insight into disease process. Alert and Oriented x 3. pleasant and cooperative. Notes Upon inspection patient's wound again showed signs of improvement this is good but nonetheless the swelling is tremendous compared to what it was previous. I do believe that she needs to contact her cardiologist ASAP. Electronic Signature(s) Signed: 05/06/2021 1:22:34 PM By: Worthy Keeler PA-C Entered By: Worthy Keeler on 05/06/2021 13:22:34 RICHARDEAN, AYDIN ([id number]) -------------------------------------------------------------------------------- Physician Orders Details Patient Name: [name] Date of Service: 05/06/2021 12:30 PM Medical Record Number: [id number] Patient Account Number: [id number] Date of Birth/Sex: August 04, 1946 (75 y.o. F) Treating RN: Carlene Coria Primary Care Provider: [name] Other Clinician: Referring Provider: [name] Treating Provider/Extender: Skipper Cliche in Treatment: 15 Verbal / Phone Orders: No Diagnosis Coding ICD-10 Coding Code Description I89.0 Lymphedema, not elsewhere classified I87.332 Chronic venous hypertension (idiopathic) with ulcer and inflammation of left lower extremity L97.828 Non-pressure chronic ulcer of other part of left lower leg with other specified severity Follow-up Appointments o Return Appointment in 1 week. Bathing/ Shower/ Hygiene o May shower with wound dressing protected with water repellent cover or cast protector. Edema  Control - Lymphedema / Segmental Compressive Device / Other Left Lower Extremity o Optional: One layer of unna paste to top of compression wrap (to act as an anchor). o Tubigrip single layer applied. - RIGHT LEG tubi grip applied "E" until you get your compression sock use this and it's hand washable o Patient to wear own compression stockings. Remove compression stockings every night before going to bed and put on every morning when getting up. - on RIGHT LEG call [facility] [facility] for compression sock 15-22 mm/Hg, measurements and phone number on blue sheet [employer] on [employer] in [place] also has the compression socks o Elevate, Exercise Daily and Avoid Standing for Long Periods of Time. o Elevate legs to the level of the heart and pump ankles as often as possible o Elevate leg(s) parallel to the floor when sitting. o Compression Pump: Use compression pump on left lower extremity for 60 minutes, twice daily. - You may pump over wraps o Compression Pump: Use compression pump on right lower extremity for 60 minutes, twice daily. - You may pump over wraps Additional Orders / Instructions Wound #2 Left,Lateral Lower Leg o Follow Nutritious Diet and Increase Protein Intake - Increase protein intake. Wound Treatment Wound #2 - Lower Leg Wound Laterality: Left, Lateral Cleanser: Soap and Water 1 x Per Week/30 Days Discharge Instructions: Gently cleanse wound with antibacterial soap,  rinse and pat dry prior to dressing wounds Topical: Triamcinolone Acetonide Cream, 0.1%, 15 (g) tube 1 x Per Week/30 Days Discharge Instructions: Apply as directed by provider. Primary Dressing: Silvercel Small 2x2 (in/in) 1 x Per Week/30 Days Discharge Instructions: Apply Silvercel Small 2x2 (in/in) as instructed Secondary Dressing: Xtrasorb Medium 4x5 (in/in) 1 x Per Week/30 Days Discharge Instructions: Apply to wound as directed. Do not cut. Compression Wrap: Profore Lite LF 3  Multilayer Compression Bandaging System 1 x Per Week/30 Days Discharge Instructions: Apply 3 multi-layer wrap as prescribed. Electronic Signature(s) Signed: 05/06/2021 1:47:40 PM By: Carlene Coria RN Signed: 05/06/2021 5:05:16 PM By: Worthy Keeler PA-C Entered By: Carlene Coria on 05/06/2021 13:18:23 JEANNE, HARSHMAN ([id number]) LAKESA, HARKLESS ([id number]) -------------------------------------------------------------------------------- Problem List Details Patient Name: [name] Date of Service: 05/06/2021 12:30 PM Medical Record Number: [id number] Patient Account Number: [id number] Date of Birth/Sex: October 28, 1945 (75 y.o. F) Treating RN: Carlene Coria Primary Care Provider: [name] Other Clinician: Referring Provider: [name] Treating Provider/Extender: Skipper Cliche in Treatment: 15 Active Problems ICD-10 Encounter Code Description Active Date MDM Diagnosis I89.0 Lymphedema, not elsewhere classified 01/16/2021 No Yes I87.332 Chronic venous hypertension (idiopathic) with ulcer and inflammation of 01/16/2021 No Yes left lower extremity L97.828 Non-pressure chronic ulcer of other part of left lower leg with other 01/16/2021 No Yes specified severity Inactive Problems Resolved Problems Electronic Signature(s) Signed: 05/06/2021 12:53:42 PM By: Worthy Keeler PA-C Entered By: Worthy Keeler on 05/06/2021 12:53:42 [name] ([id number]) -------------------------------------------------------------------------------- Progress Note Details Patient Name: [name] Date of Service: 05/06/2021 12:30 PM Medical Record Number: [id number] Patient Account Number: [id number] Date of Birth/Sex: 1946-02-16 (75 y.o. F) Treating RN: Carlene Coria Primary Care Provider: [name] Other Clinician: Referring Provider: [name] Treating Provider/Extender: Skipper Cliche in Treatment: 15 Subjective Chief Complaint Information obtained from Patient Left  LE Ulcer History of Present Illness (HPI) 03/15/2020 upon evaluation today patient presents today for initial evaluation here in our clinic concerning a wound that is on the left posterior lower extremity. Unfortunately this has been giving the patient some discomfort at this point which she notes has been affected her pretty much daily. The patient does have a history of venous insufficiency, hypertension, congestive heart failure, and a history of breast cancer. Fortunately there does not appear to be evidence of active infection at this time which is great news. No fevers, chills, nausea, vomiting, or diarrhea. Wound is not extremely large but does have some slough covering the surface of the wound. This is going require sharp debridement today. 03/15/2020 upon evaluation today patient appears to be doing fairly well in regard to her wound. She does have some slough noted on the surface of the wound currently but I do believe the Iodoflex has been beneficial over the past week. There is no signs of active infection at this time which is great news and overall very pleased with the progress. No fevers, chills, nausea, vomiting, or diarrhea. 04/02/2020 upon evaluation today patient appears to be doing a little better in regard to her wound size wise this is not tremendously smaller she does have some slough buildup on the surface of the wound. With that being said this is going require some sharp debridement today to clear away some of the necrotic debris. Fortunately there is no evidence of active infection at this time. No fevers, chills, nausea, vomiting, or diarrhea. 04/10/2020 on evaluation today patient appears to be doing well with regard to her ulcer which is measuring somewhat  smaller today. She actually has more granulation tissue noted which is also good news is no need for sharp debridement today. Fortunately there is no evidence of infection either which is also excellent. No fevers, chills,  nausea, vomiting, or diarrhea. 04/26/2020 on evaluation today patient's wound actually is showing signs of good improvement which is great news. There does not appear to be any evidence of active infection. I do believe she is tolerating the collagen at this point. 05/03/2020 upon evaluation today patient's wound actually showed signs of good granulation at this time there does not appear to be any evidence of active infection which is great news and overall very pleased with where things stand. With that being said I do believe that the patient is can require some sharp debridement today but fortunately nothing too significant. 05/11/20 on evaluation today patient appears to be doing well in regard to her leg ulcer. She's been tolerating the dressing changes without complication. Fortunately there is no signs of active infection at this time. Overall I'm very pleased with where things stand. 05/18/2020 upon evaluation today patient's wound is showing signs of improvement is very dry and I think the collagen for the most part just dried to the wound bed. I am going to clean this away with sharp debridement in order to get this under control in my opinion. 05/25/2020 upon evaluation today patient actually appears to be doing well in regard to her leg ulcer. In fact upon inspection it appears she could potentially be completely healed but that is not guaranteed based on what we are seeing. There does not appear to be any signs of active infection which is great news. 05/31/2020 on evaluation today patient appears to be doing well with regard to her wounds. She in fact appears to be almost completely healed on the left leg there is just a very small opening remaining point 1 06/08/2020 upon evaluation today patient appears to be doing excellent in regard to her leg ulcer on the left in fact it appears to be that she is completely healed as of today. I see no signs of active infection at this time which is great  news. No fevers, chills, nausea, vomiting, or diarrhea. READMISSION 01/16/2021 Patient that we have had in this clinic with a wound on the left posterior calf in the summer 2021 extending into October. She was felt to have chronic venous insufficiency. Per the patient's description she was discharged with what sounds like external compression stockings although she could not afford the "$75 per leg". She therefore did not get anything. Apparently her wound that she has currently is been in existence since January. She has been followed at vein and vascular with Unna boots and I think calcium alginate. She had ABIs done on 06/25/2020 that showed an noncompressible ABI on the right leg and the left leg but triphasic waveforms with good great toe pressures and a TBI of 0.90 on the right and 0.91 on the left Surprisingly the patient also had venous reflux studies that were really unremarkable. This included no evidence of DVTs or SVTs but there was no evidence of deep venous insufficiency or superficial venous insufficiency in the greater saphenous or short saphenous veins bilaterally. I would wonder about more central venous issues or perhaps this is all lymphedema 01/25/2021 upon evaluation today patient appears to be doing decently well and guard to her wound. The good news is the Iodoflex that is job she saw Dr. Dellia Nims last week for readmission  and to be honest I think that this has done extremely well over that week. I do believe that she can tolerate the sharp debridement today which will be good. I think that cleaning off the wound will likely allow [place] to be able to use a different type of dressing I do not think the Iodoflex will even be necessary based on how clean the wound looks. Fortunately there is no sign of active infection at this time which is great news. No fevers, chills, nausea, vomiting, or diarrhea. GLENDORIS, KLICK ([id number]) 02/12/2021 upon evaluation today patient appears to be  doing well with regard to her leg ulcer. We did switch to alginate when she came for nurse visit I think is doing much better for her. Fortunately there does not appear to be any signs of active infection at this time which is great news. No fevers, chills, nausea, vomiting, or diarrhea. 02/18/2021 upon evaluation today patient's wound is actually showing signs of good improvement. I am very pleased with where things stand currently. I do not see any signs of active infection which is great news and overall I feel like she is making good progress. The patient likewise is happy that things are doing so well and that this is measuring somewhat smaller. 02/25/2021 upon evaluation today patient actually appears to be doing quite well in regard to her wounds. She has been tolerating the dressing changes without complication. Fortunately there does not appear to be any signs of active infection which is great news and overall I am extremely pleased with where things stand. No fevers, chills, nausea, vomiting, or diarrhea. She does have a lot of edema I really do think she needs compression socks to be worn in order to prevent things from causing additional issues for her currently. Especially on the right leg she should be wearing compression socks all the time to be honest. 03/04/2021 upon evaluation today patient's leg ulcer actually appears to be doing pretty well which is great news. There does not appear to be any significant change but overall the appearance is better even though the size is not necessarily reflecting a great improvement. Fortunately there does not appear to be any signs of active infection. No fevers, chills, nausea, vomiting, or diarrhea. 03/12/2021 upon evaluation today patient appears to be doing well with regard to her wounds. She has been tolerating the dressing changes without complication. The good news is that the wound on her left lateral leg is doing much better and is showing  signs of good improvement. Overall her swelling is controlled well as well. She does need some compression socks she plans to go Jefferey socks next week. 03/18/2021 on evaluation today patient's wound actually is showing signs of good improvement. She does have a little bit of slough this can require some sharp debridement today. 03/25/2021 upon evaluation today patient appears to be doing well with regard to her wound on the left lateral leg. She has been tolerating the dressing changes without complication. Fortunately there does not appear to be any signs of active infection at this time. No fever chills no 04/01/2021 upon evaluation today patient's wound is showing signs of improvement and overall very pleased with where things stand at this point. There is no evidence of active infection which is great news as well and in general I think that she is making great progress. 04/09/2021 upon evaluation today patient appears to be doing well with regard to her ankle ulcer. She is making good progress  currently which is great news. There does not appear to be any signs of infection also excellent news. In general I am extremely pleased with where things stand today. No fevers, chills, nausea, vomiting, or diarrhea. 04/23/2021 upon inspection today patient appears to be doing quite well with regard to her leg ulcer. She has been tolerating the dressing changes without complication. She is can require some sharp debridement today but overall seems to be doing excellent. 05/06/2021 upon evaluation today patient unfortunately appears to be doing poorly overall in regard to her swelling. She is actually significantly more swollen than last week despite the fact that her wound is doing better this is not a good trend. I think that she probably needs to see her cardiologist ASAP and I discussed that with her today. This includes the fact that honestly I think she probably is getting need an increase in  her diuretic or something to try to clear away some of the excess fluid that she is experiencing at the moment. She is not been doing her pumps even twice a day but again right now more concerned that if she were to do the pump she would fluid overload her lungs causing other issues as far as her breathing is concerned. Obviously I think she needs to contact them and move up the appointment for the 12th of something sooner 2 weeks is a bit too long to wait with how swollen she has. Objective Constitutional Obese and well-hydrated in no acute distress. Vitals Time Taken: 12:46 PM, Height: 69 in, Weight: 244 lbs, BMI: 36, Temperature: 98.5 F, Pulse: 71 bpm, Respiratory Rate: 18 breaths/min, Blood Pressure: 129/72 mmHg. Respiratory normal breathing without difficulty. Psychiatric this patient is able to make decisions and demonstrates good insight into disease process. Alert and Oriented x 3. pleasant and cooperative. General Notes: Upon inspection patient's wound again showed signs of improvement this is good but nonetheless the swelling is tremendous compared to what it was previous. I do believe that she needs to contact her cardiologist ASAP. Integumentary (Hair, Skin) Wound #2 status is Open. Original cause of wound was Gradually Appeared. The date acquired was: 09/08/2020. The wound has been in treatment 15 weeks. The wound is located on the Left,Lateral Lower Leg. The wound measures 2.3cm length x 1cm width x 0.1cm depth; 1.806cm^2 area and 0.181cm^3 volume. There is Fat Layer (Subcutaneous Tissue) exposed. There is no tunneling or undermining noted. There is a medium Holstein, Beatriz ([id number]) amount of serosanguineous drainage noted. There is medium (34-66%) red granulation within the wound bed. There is a medium (34-66%) amount of necrotic tissue within the wound bed including Adherent Slough. 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 There was a Three Layer Compression Therapy Procedure by Carlene Coria, RN. Post procedure Diagnosis Wound #2: Same as Pre-Procedure Plan Follow-up Appointments: Return Appointment in 1 week. Bathing/ Shower/ Hygiene: May shower with wound dressing protected with water repellent cover or cast protector. Edema Control - Lymphedema / Segmental Compressive Device / Other: Optional: One layer of unna paste to top of compression wrap (to act as an anchor). Tubigrip single layer applied. - RIGHT LEG tubi grip applied "E" until you get your compression sock use this and it's  hand washable Patient to wear own compression stockings. Remove compression stockings every night before going to bed and put on every morning when getting up. - on RIGHT LEG call [facility] for compression sock 15-22 mm/Hg, measurements and phone number on blue sheet [employer] on [employer] in [place] also has the compression socks Elevate, Exercise Daily and Avoid Standing for Long Periods of Time. Elevate legs to the level of the heart and pump ankles as often as possible Elevate leg(s) parallel to the floor when sitting. Compression Pump: Use compression pump on left lower extremity for 60 minutes, twice daily. - You may pump over wraps Compression Pump: Use compression pump on right lower extremity for 60 minutes, twice daily. - You may pump over wraps Additional Orders / Instructions: Wound #2 Left,Lateral Lower Leg: Follow Nutritious Diet and Increase Protein Intake - Increase protein intake. WOUND #2: - Lower Leg Wound Laterality: Left, Lateral Cleanser: Soap and Water 1 x Per Week/30 Days Discharge Instructions: Gently  cleanse wound with antibacterial soap, rinse and pat dry prior to dressing wounds Topical: Triamcinolone Acetonide Cream, 0.1%, 15 (g) tube 1 x Per Week/30 Days Discharge Instructions: Apply as directed by provider. Primary Dressing: Silvercel Small 2x2 (in/in) 1 x Per Week/30 Days Discharge Instructions: Apply Silvercel Small 2x2 (in/in) as instructed Secondary Dressing: Xtrasorb Medium 4x5 (in/in) 1 x Per Week/30 Days Discharge Instructions: Apply to wound as directed. Do not cut. Compression Wrap: Profore Lite LF 3 Multilayer Compression Bandaging System 1 x Per Week/30 Days Discharge Instructions: Apply 3 multi-layer wrap as prescribed. 1. I am going to recommend at this time that the patient contact cardiology and she actually has her paper with her number on it that she showed me today she is going to call them as soon as she leaves here today. Subsequently I think that she probably is going to need an increase in her fluid pills to try to get some of this excess fluid away. 2. I am also can recommend that we have the patient continue with the silver cell for the leg this still seems to be doing well and I am not can increase the compression we can leave it at the 3 layer I do not want him to go up to 4 and then again because a volume overload in the lungs which could be a bigger issue for her as well. SHAWNN, ROTTMAN ([id number]) We will see patient back for reevaluation in 1 week here in the clinic. If anything worsens or changes patient will contact our office for additional recommendations. Electronic Signature(s) Signed: 05/06/2021 1:23:12 PM By: Worthy Keeler PA-C Entered By: Worthy Keeler on 05/06/2021 13:23:12 JALEIGH, PROVAN ([id number]) -------------------------------------------------------------------------------- SuperBill Details Patient Name: [name] Date of Service: 05/06/2021 Medical Record Number: [id number] Patient Account Number: [id number] Date of  Birth/Sex: 1946/03/02 (75 y.o. F) Treating RN: Carlene Coria Primary Care Provider: [name] Other Clinician: Referring Provider: [name] Treating Provider/Extender: Skipper Cliche in Treatment: 15 Diagnosis Coding ICD-10 Codes Code Description I89.0 Lymphedema, not elsewhere classified I87.332 Chronic venous hypertension (idiopathic) with ulcer and inflammation of left lower extremity L97.828 Non-pressure chronic ulcer of other part of left lower leg with other specified severity Facility Procedures CPT4 Code: [id number] Description: (Facility Use Only) [id number] - APPLY MULTLAY COMPRS LWR LT LEG Modifier: Quantity: 1 Physician Procedures CPT4 Code Description: [id number] - WC PHYS LEVEL 4 - EST PT Modifier: Quantity: 1 CPT4 Code Description: ICD-10 Diagnosis Description I89.0 Lymphedema, not elsewhere classified  [id number] Chronic venous hypertension (idiopathic) with ulcer and inflammation of le L97.828 Non-pressure chronic ulcer of other part of left lower leg with  other spec Modifier: ft lower extremity ified severity Quantity: Electronic Signature(s) Signed: 05/06/2021 1:23:21 PM By: Worthy Keeler PA-C Entered By: Worthy Keeler on 05/06/2021 13:23:20

## 2021-05-06 NOTE — Progress Notes (Signed)
[name], [name] ([id number]) Visit Report for 05/06/2021 Arrival Information Details Patient Name: [name], [name] Date of Service: 05/06/2021 12:30 PM Medical Record Number: [id number] Patient Account Number: [id number] Date of Birth/Sex: [birth year] (75 y.o. F) Treating RN: Carlene Coria Primary Care Jaycee Pelzer: [name] Other Clinician: Referring Maddox Hlavaty: [name] Treating Beverlyn Mcginness/Extender: Skipper Cliche in Treatment: 15 Visit Information History Since Last Visit All ordered tests and consults were completed: No Patient Arrived: Ambulatory Added or deleted any medications: No Arrival Time: 12:45 Any new allergies or adverse reactions: No Accompanied By: self Had a fall or experienced change in No Transfer Assistance: None activities of daily living that may affect Patient Identification Verified: Yes risk of falls: Secondary Verification Process Completed: Yes Signs or symptoms of abuse/neglect since last visito No Patient Requires Transmission-Based Precautions: No Hospitalized since last visit: No Patient Has Alerts: Yes Implantable device outside of the clinic excluding No Patient Alerts: NOT DIABETIC cellular tissue based products placed in the center since last visit: Has Dressing in Place as Prescribed: Yes Has Compression in Place as Prescribed: Yes Pain Present Now: No Electronic Signature(s) Signed: 05/06/2021 1:47:40 PM By: Carlene Coria RN Entered By: Carlene Coria on 05/06/2021 12:46:17 [name] ([id number]) -------------------------------------------------------------------------------- Clinic Level of Care Assessment Details Patient Name: [name] Date of Service: 05/06/2021 12:30 PM Medical Record Number: [id number] Patient Account Number: [id number] Date of Birth/Sex: 04-26-1946 (75 y.o. F) Treating RN: Carlene Coria Primary Care Chael Urenda: [name] Other Clinician: Referring Javoris Star: [name] Treating Jakori Burkett/Extender:  Skipper Cliche in Treatment: 15 Clinic Level of Care Assessment Items TOOL 1 Quantity Score _0  - Use when EandM and Procedure is performed on INITIAL visit 0 ASSESSMENTS - Nursing Assessment / Reassessment _1  - General Physical Exam (combine w/ comprehensive assessment (listed just below) when performed on new 0 pt. evals) _2  - 0 Comprehensive Assessment (HX, ROS, Risk Assessments, Wounds Hx, etc.) ASSESSMENTS - Wound and Skin Assessment / Reassessment _3  - Dermatologic / Skin Assessment (not related to wound area) 0 ASSESSMENTS - Ostomy and/or Continence Assessment and Care _4  - Incontinence Assessment and Management 0 _5  - 0 Ostomy Care Assessment and Management (repouching, etc.) PROCESS - Coordination of Care _6  - Simple Patient / Family Education for ongoing care 0 _7  - 0 Complex (extensive) Patient / Family Education for ongoing care _8  - 0 Staff obtains Programmer, systems, Records, Test Results / Process Orders _9  - 0 Staff telephones HHA, Nursing Homes / Clarify orders / etc _10  - 0 Routine Transfer to another Facility (non-emergent condition) _11  - 0 Routine Hospital Admission (non-emergent condition) _12  - 0 New Admissions / Biomedical engineer / Ordering NPWT, Apligraf, etc. _13  - 0 Emergency Hospital Admission (emergent condition) PROCESS - Special Needs _14  - Pediatric / Minor Patient Management 0 _15  - 0 Isolation Patient Management _16  - 0 Hearing / Language / Visual special needs _17  - 0 Assessment of Community assistance (transportation, D/C planning, etc.) _18  - 0 Additional assistance / Altered mentation _19  - 0 Support Surface(s) Assessment (bed, cushion, seat, etc.) INTERVENTIONS - Miscellaneous _20  - External ear exam 0 _21  - 0 Patient Transfer (multiple staff / Civil Service fast streamer / Similar devices) _22  - 0 Simple Staple / Suture removal (25 or less) _23  - 0 Complex Staple / Suture removal (26 or more) _24  - 0 Hypo/Hyperglycemic Management (do not check if billed  separately) _25  - 0 Ankle / Brachial Index (ABI) - do not check if billed separately Has the patient been seen at the hospital within the last  three years: Yes Total Score: 0 Level Of Care: ____ [name] ([id number]) Electronic Signature(s) Signed: 05/06/2021 1:47:40 PM By: Carlene Coria RN Entered By: Carlene Coria on 05/06/2021 13:18:53 [name] ([id number]) -------------------------------------------------------------------------------- Compression Therapy Details Patient Name: [name] Date of Service: 05/06/2021 12:30 PM Medical Record Number: [id number] Patient Account Number: [id number] Date of Birth/Sex: 09-14-1945 (75 y.o. F) Treating RN: Carlene Coria Primary Care Amandajo Gonder: [name] Other Clinician: Referring Ladarion Munyon: [name] Treating Lamiah Marmol/Extender: Skipper Cliche in Treatment: 15 Compression Therapy Performed for Wound Assessment: Wound #2 Left,Lateral Lower Leg Performed By: Clinician Carlene Coria, RN Compression Type: Three Layer Post Procedure Diagnosis Same as Pre-procedure Electronic Signature(s) Signed: 05/06/2021 1:47:40 PM By: Carlene Coria RN Entered By: Carlene Coria on 05/06/2021 13:15:18 [name] ([id number]) -------------------------------------------------------------------------------- Encounter Discharge Information Details Patient Name: [name] Date of Service: 05/06/2021 12:30 PM Medical Record Number: [id number] Patient Account Number: [id number] Date of Birth/Sex: Apr 20, 1946 (75 y.o. F) Treating RN: Carlene Coria Primary Care Kenzley Ke: [name] Other Clinician: Referring Audie Stayer: [name] Treating Naylea Wigington/Extender: Skipper Cliche in Treatment: 15 Encounter Discharge Information Items Discharge Condition: Stable Ambulatory Status: Walker Discharge Destination: Home Transportation: Private Auto Accompanied By: self Schedule Follow-up Appointment: Yes Clinical Summary of Care:  Patient Declined Electronic Signature(s) Signed: 05/06/2021 1:47:40 PM By: Carlene Coria RN Entered By: Carlene Coria on 05/06/2021 13:19:57 [name] ([id number]) -------------------------------------------------------------------------------- Lower Extremity Assessment Details Patient Name: [name] Date of Service: 05/06/2021 12:30 PM Medical Record Number: [id number] Patient Account Number: [id number] Date of Birth/Sex: 05/17/1946 (75 y.o. F) Treating RN: Carlene Coria Primary Care Sione Baumgarten: [name] Other Clinician: Referring Suezette Lafave: [name] Treating Raylie Maddison/Extender: Skipper Cliche in Treatment: 15 Edema Assessment Assessed: [Left: No] [Right: No] Edema: [Left: Ye] [Right: s] Calf Left: Right: Point of Measurement: 27 cm From Medial Instep 43 cm Ankle Left: Right: Point of Measurement: 10 cm From Medial Instep 26 cm Vascular Assessment Pulses: Dorsalis Pedis Palpable: [Left:Yes] Electronic Signature(s) Signed: 05/06/2021 1:47:40 PM By: Carlene Coria RN Entered By: Carlene Coria on 05/06/2021 12:55:27 [name] ([id number]) -------------------------------------------------------------------------------- Multi Wound Chart Details Patient Name: [name] Date of Service: 05/06/2021 12:30 PM Medical Record Number: [id number] Patient Account Number: [id number] Date of Birth/Sex: [birth year] (75 y.o. F) Treating RN: Carlene Coria Primary Care Morell Mears: [name] Other Clinician: Referring Debra Colon: [name] Treating Voula Waln/Extender: Skipper Cliche in Treatment: 15 Vital Signs Height(in): 69 Pulse(bpm): 18 Weight(lbs): 244 Blood Pressure(mmHg): 129/72 Body Mass Index(BMI): 36 Temperature(F): 98.5 Respiratory Rate(breaths/min): 18 Photos: [N/A:N/A] Wound Location: Left, Lateral Lower Leg N/A N/A Wounding Event: Gradually Appeared N/A N/A Primary Etiology: Venous Leg Ulcer N/A N/A Comorbid History: Cataracts,  Lymphedema, Sleep N/A N/A Apnea, Congestive Heart Failure, Hypertension, Osteoarthritis, Neuropathy, Received Chemotherapy, Received Radiation Date Acquired: 09/08/2020 N/A N/A Weeks of Treatment: 15 N/A N/A Wound Status: Open N/A N/A Measurements L x W x D (cm) 2.3x1x0.1 N/A N/A Area (cm) : 1.806 N/A N/A Volume (cm) : 0.181 N/A N/A % Reduction in Area: 61.70% N/A N/A % Reduction in Volume: 61.60% N/A N/A Classification: Full Thickness Without Exposed N/A N/A Support Structures Exudate Amount: Medium N/A N/A Exudate Type: Serosanguineous N/A N/A Exudate Color: red, brown N/A N/A Granulation Amount: Medium (34-66%) N/A N/A Granulation Quality: Red N/A N/A Necrotic Amount: Medium (34-66%) N/A N/A Exposed Structures: Fat Layer (Subcutaneous Tissue): N/A N/A Yes Fascia: No Tendon: No Muscle: No Joint: No Bone: No Epithelialization: Medium (34-66%) N/A N/A Treatment Notes Electronic Signature(s) Signed: 05/06/2021 1:47:40 PM By: Carlene Coria RN Entered By:  Carlene Coria on 05/06/2021 13:14:56 [name], [name] ([id number]) -------------------------------------------------------------------------------- Multi-Disciplinary Care Plan Details Patient Name: [name], [name] Date of Service: 05/06/2021 12:30 PM Medical Record Number: [id number] Patient Account Number: [id number] Date of Birth/Sex: 17-May-1946 (75 y.o. F) Treating RN: Carlene Coria Primary Care Embry Huss: [name] Other Clinician: Referring Jaley Yan: [name] Treating Oakes Mccready/Extender: Skipper Cliche in Treatment: 15 Active Inactive Wound/Skin Impairment Nursing Diagnoses: Impaired tissue integrity Goals: Patient/caregiver will verbalize understanding of skin care regimen Date Initiated: 01/16/2021 Date Inactivated: 02/18/2021 Target Resolution Date: 02/16/2021 Goal Status: Met Ulcer/skin breakdown will have a volume reduction of 30% by week 4 Date Initiated: 01/16/2021 Date Inactivated:  02/18/2021 Target Resolution Date: 02/16/2021 Goal Status: Met Ulcer/skin breakdown will have a volume reduction of 50% by week 8 Date Initiated: 01/16/2021 Date Inactivated: 04/01/2021 Target Resolution Date: 03/18/2021 Goal Status: Unmet Unmet Reason: comorbities Ulcer/skin breakdown will have a volume reduction of 80% by week 12 Date Initiated: 04/01/2021 Date Inactivated: 04/23/2021 Target Resolution Date: 04/18/2021 Goal Status: Unmet Unmet Reason: comorbities Ulcer/skin breakdown will heal within 14 weeks Date Initiated: 04/01/2021 Target Resolution Date: 05/19/2021 Goal Status: Active Interventions: Assess ulceration(s) every visit Treatment Activities: Topical wound management initiated : 01/16/2021 Notes: Electronic Signature(s) Signed: 05/06/2021 1:47:40 PM By: Carlene Coria RN Entered By: Carlene Coria on 05/06/2021 13:14:46 [name] ([id number]) -------------------------------------------------------------------------------- Pain Assessment Details Patient Name: [name] Date of Service: 05/06/2021 12:30 PM Medical Record Number: [id number] Patient Account Number: [id number] Date of Birth/Sex: 11/23/1945 (75 y.o. F) Treating RN: Carlene Coria Primary Care Leira Regino: [name] Other Clinician: Referring Parlee Amescua: [name] Treating Gianluca Chhim/Extender: Skipper Cliche in Treatment: 15 Active Problems Location of Pain Severity and Description of Pain Patient Has Paino No Site Locations Pain Management and Medication Current Pain Management: Electronic Signature(s) Signed: 05/06/2021 1:47:40 PM By: Carlene Coria RN Entered By: Carlene Coria on 05/06/2021 12:46:51 [name] ([id number]) -------------------------------------------------------------------------------- Patient/Caregiver Education Details Patient Name: [name] Date of Service: 05/06/2021 12:30 PM Medical Record Number: [id number] Patient Account Number: [id number] Date of  Birth/Gender: 06-23-1946 (75 y.o. F) Treating RN: Carlene Coria Primary Care Physician: [name] Other Clinician: Referring Physician: [name] Treating Physician/Extender: Skipper Cliche in Treatment: 15 Education Assessment Education Provided To: Patient Education Topics Provided Wound/Skin Impairment: Methods: Explain/Verbal Responses: State content correctly Electronic Signature(s) Signed: 05/06/2021 1:47:40 PM By: Carlene Coria RN Entered By: Carlene Coria on 05/06/2021 13:19:18 [name] ([id number]) -------------------------------------------------------------------------------- Wound Assessment Details Patient Name: [name] Date of Service: 05/06/2021 12:30 PM Medical Record Number: [id number] Patient Account Number: [id number] Date of Birth/Sex: Jul 11, 1946 (75 y.o. F) Treating RN: Carlene Coria Primary Care Hussien Greenblatt: [name] Other Clinician: Referring Bridgid Printz: [name] Treating Jowan Skillin/Extender: Skipper Cliche in Treatment: 15 Wound Status Wound Number: 2 Primary Venous Leg Ulcer Etiology: Wound Location: Left, Lateral Lower Leg Wound Open Wounding Event: Gradually Appeared Status: Date Acquired: 09/08/2020 Comorbid Cataracts, Lymphedema, Sleep Apnea, Congestive Heart Weeks Of Treatment: 15 History: Failure, Hypertension, Osteoarthritis, Neuropathy, Clustered Wound: No Received Chemotherapy, Received Radiation Photos Wound Measurements Length: (cm) 2.3 Width: (cm) 1 Depth: (cm) 0.1 Area: (cm) 1.806 Volume: (cm) 0.181 % Reduction in Area: 61.7% % Reduction in Volume: 61.6% Epithelialization: Medium (34-66%) Tunneling: No Undermining: No Wound Description Classification: Full Thickness Without Exposed Support Structu Exudate Amount: Medium Exudate Type: Serosanguineous Exudate Color: red, brown res Foul Odor After Cleansing: No Slough/Fibrino Yes Wound Bed Granulation Amount: Medium (34-66%) Exposed  Structure Granulation Quality: Red Fascia Exposed: No Necrotic Amount: Medium (34-66%) Fat Layer (Subcutaneous Tissue) Exposed: Yes Necrotic Quality:  Adherent Slough Tendon Exposed: No Muscle Exposed: No Joint Exposed: No Bone Exposed: No Treatment Notes Wound #2 (Lower Leg) Wound Laterality: Left, Lateral Cleanser Soap and Water Discharge Instruction: Gently cleanse wound with antibacterial soap, rinse and pat dry prior to dressing wounds Peri-Wound Care ANDILYNN, [name] ([id number]) Topical Triamcinolone Acetonide Cream, 0.1%, 15 (g) tube Discharge Instruction: Apply as directed by Anahla Bevis. Primary Dressing Silvercel Small 2x2 (in/in) Discharge Instruction: Apply Silvercel Small 2x2 (in/in) as instructed Secondary Dressing Xtrasorb Medium 4x5 (in/in) Discharge Instruction: Apply to wound as directed. Do not cut. Secured With Compression Wrap Profore Lite LF 3 Multilayer Compression Bandaging System Discharge Instruction: Apply 3 multi-layer wrap as prescribed. Compression Stockings Add-Ons Electronic Signature(s) Signed: 05/06/2021 1:47:40 PM By: Carlene Coria RN Entered By: Carlene Coria on 05/06/2021 12:54:43 [name] ([id number]) -------------------------------------------------------------------------------- Vitals Details Patient Name: [name] Date of Service: 05/06/2021 12:30 PM Medical Record Number: [id number] Patient Account Number: [id number] Date of Birth/Sex: 01-Mar-1946 (75 y.o. F) Treating RN: Carlene Coria Primary Care Haelyn Forgey: [name] Other Clinician: Referring Dyesha Henault: [name] Treating Jachob Mcclean/Extender: Skipper Cliche in Treatment: 15 Vital Signs Time Taken: 12:46 Temperature (F): 98.5 Height (in): 69 Pulse (bpm): 71 Weight (lbs): 244 Respiratory Rate (breaths/min): 18 Body Mass Index (BMI): 36 Blood Pressure (mmHg): 129/72 Reference Range: 80 - 120 mg / dl Electronic Signature(s) Signed: 05/06/2021 1:47:40 PM  By: Carlene Coria RN Entered By: Carlene Coria on 05/06/2021 12:46:45

## 2021-05-06 NOTE — Telephone Encounter (Signed)
Attempted to reach pt via phone, unable to make contact, LDMTCB (DPR approved).  Advised on weight gain of 20 lbs in 2 months to cut out all salt/sodium intake, no can or precess foods, okay to take extra torsemide PRN for noted swelling/edema around ankles, lower extremities.   Weight self daily (dry weight)  Pt has appt 9/12 at 3 pm, strongly advised to keep appt for evaluation and medication changes.   Advised to call back for further concerns/recommendations.

## 2021-05-08 NOTE — Telephone Encounter (Signed)
Attempt x2 to reach back out to pt, called pt's home number on file No answer, unable to make contact LMTCB for advice.

## 2021-05-10 ENCOUNTER — Encounter: Payer: Self-pay | Admitting: Cardiovascular Disease

## 2021-05-10 ENCOUNTER — Ambulatory Visit (INDEPENDENT_AMBULATORY_CARE_PROVIDER_SITE_OTHER): Payer: Medicare Other | Admitting: Cardiovascular Disease

## 2021-05-10 ENCOUNTER — Other Ambulatory Visit: Payer: Self-pay

## 2021-05-10 VITALS — BP 150/80 | HR 55 | Ht 69.0 in | Wt 240.0 lb

## 2021-05-10 DIAGNOSIS — R0602 Shortness of breath: Secondary | ICD-10-CM | POA: Diagnosis not present

## 2021-05-10 DIAGNOSIS — G459 Transient cerebral ischemic attack, unspecified: Secondary | ICD-10-CM

## 2021-05-10 DIAGNOSIS — I10 Essential (primary) hypertension: Secondary | ICD-10-CM

## 2021-05-10 DIAGNOSIS — I5032 Chronic diastolic (congestive) heart failure: Secondary | ICD-10-CM | POA: Diagnosis not present

## 2021-05-10 MED ORDER — TORSEMIDE 40 MG PO TABS: 40.0000 mg | ORAL_TABLET | Freq: Two times a day (BID) | ORAL | 3 refills | Status: DC

## 2021-05-10 NOTE — Progress Notes (Signed)
Date:  05/10/2021   ID:  [name], DOB 11-03-1945, MRN [id number]  Patient Location:  [place] [id number]   Provider location:   [facility], [place] office  PCP:  Donnie Coffin, MD  Cardiologist:  Patsy Baltimore   Chief Complaint  Patient presents with   6 month follow up     Patient c/o LE edema. Medications reviewed by the patient verbally.     History of Present Illness:    [name] is a 75 y.o. female past medical history of Medication confusion/Medication noncompliance No family to help, uses [employer] for transportation morbid obesity,  severe lower extremity edema, dating back to 1324 systolic CHF, ejection fraction 25% in 2012  , Up to EF 50% to 55% in 03/2017 sleep apnea, scheduled for repeat testing. Previous machine broke pulmonary disease,  severe hypertension,  likely secondary to medication noncompliance who presents For follow-up of her hypertension, cardiomyopathy, edema   On today's visit, Weight up 15 pounds Forgets torsemide in the p.m. High fluid intake, legs are massively swollen Reports she has been back to the wound clinic for wound that is nonhealing left leg, Unna wrap in place  Has lymphedema pumps, not using them on a regular basis Unable to put compression hose on  Labs 10/2020 reviewed CR 1.4, BUN 28 CBC normal  On prior clinic visit, Not using her CPAP Previously received a new machine from [employer] mask  too small   EKG personally reviewed by myself on todays visit Shows sinus bradycardia rate 55 bpm nonspecific T wave abnormality    Other past medical history reviewed hospitalization July 2018 for TIA and chest pain Slurring, head hurt, H/A, felt hot She had MRI brain without acute finding Neurology consult with no clear recommendations Chest pain resolved without intervention Carotid ultrasound without significant stenosis SNF was recommended    Past Medical History:   Diagnosis Date   Abnormality of gait    Breast cancer ([place]) 02/05/2005   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    Breast cancer ([place]) 2019   right breast   CHF (congestive heart failure) ([place])    March 28, 2017 echo: EF 50-55%; 1) echo 07/2011: EF 20-25%, mild concentric hypertrophy, diffuse HK, regional WMA cannot be excluded, grade 1 DD, mitral valve with mild regurg, LA mildly dilated). 2) EF 40-45%, mild AS,                     Complication of anesthesia 2006   pt states "hard to wake up" after L breast surgery    Edema    GERD (gastroesophageal reflux disease)    Headache    Heart murmur    Hypertension    Hypokinesis    global, EF 35-45 % from Echo.   Malignant neoplasm of breast (female), unspecified site 06/14/2018   RIGHT T1b, N0; ER/PR positive, HER-2 negative.   Neuropathy    Obesity    Pain in joint, site unspecified    Personal history of chemotherapy 2006   left breast   Personal history of radiation therapy 2019   right breast   Personal history of radiation therapy 2006   left breast   Pneumonia    Sleep apnea    Tinea pedis    Unspecified hearing loss    bilateral   Past Surgical History:  Procedure Laterality Date  BREAST BIOPSY Right 2019   11 oc-INVASIVE MAMMARY CARCINOMA, NO SPECIAL TYPE   BREAST BIOPSY Right 2019   1030 oc- neg   BREAST LUMPECTOMY  2006   left breast    CATARACT EXTRACTION     left eye   HERNIA REPAIR  05/25/2006   Incarcerated omentum in ventral hernia, Ventralight mesh, combined lap/ open with omental resection.    PARTIAL MASTECTOMY WITH NEEDLE LOCALIZATION Right 06/14/2018   Procedure: PARTIAL MASTECTOMY WITH NEEDLE LOCALIZATION;  Surgeon: Robert Bellow, MD;  Location: [facility] ORS;  Service: General;  Laterality: Right;   PORT-A-CATH REMOVAL  05/25/2006   SENTINEL NODE BIOPSY Right 06/14/2018   Procedure: SENTINEL NODE BIOPSY;  Surgeon:  Robert Bellow, MD;  Location: [facility] ORS;  Service: General;  Laterality: Right;   TUBAL LIGATION       Current Meds  Medication Sig   anastrozole (ARIMIDEX) 1 MG tablet TAKE 1 TABLET BY MOUTH DAILY. PATIENT MUST MAKE AN APPT TO SEE DR RAO   aspirin EC 81 MG tablet Take 81 mg by mouth daily.   cloNIDine (CATAPRES) 0.2 MG tablet Take 1 tablet (0.2 mg total) by mouth 2 (two) times daily.   clotrimazole (LOTRIMIN) 1 % cream Apply 1 application topically 2 (two) times daily.    [employer] CALCIUM 600 & VITAMIN D3 600-800 MG-UNIT TABS TAKE 1 TABLET BY MOUTH TWICE A DAY   diclofenac Sodium (VOLTAREN) 1 % GEL Apply 1 application topically as needed.   Incontinence Supply Disposable (BLADDER CONTROL PADS EX ABSORB) MISC Apply 1 Dose topically as needed.    isosorbide mononitrate (IMDUR) 60 MG 24 hr tablet Take 1 tablet (60 mg total) by mouth 2 (two) times daily.   loratadine (CLARITIN) 10 MG tablet Take 10 mg by mouth daily.   losartan (COZAAR) 25 MG tablet Take 25 mg by mouth daily.    methocarbamol (ROBAXIN) 500 MG tablet Take 500 mg by mouth 2 (two) times daily as needed.   MIRALAX 17 GM/SCOOP powder SMARTSIG:2 Scoopful By Mouth Daily PRN   omeprazole (PRILOSEC) 40 MG capsule Take 40 mg by mouth daily.   oxybutynin (DITROPAN) 5 MG tablet Take 5 mg by mouth 2 (two) times daily.   potassium chloride SA (K-DUR) 20 MEQ tablet Take 1 tablet (20 meq) by mouth once daily   torsemide (DEMADEX) 20 MG tablet TAKE 1 TABLET BY MOUTH TWICE A DAY   triamcinolone cream (KENALOG) 0.1 % Apply 1 application topically 2 (two) times daily. Back of legs     Allergies:   Patient has no known allergies.   Social History   Tobacco Use   Smoking status: Former    Packs/day: 0.50    Years: 30.00    Pack years: 15.00    Types: Cigarettes    Quit date: 07/14/2000    Years since quitting: 20.8   Smokeless tobacco: Never  Vaping Use   Vaping Use: Never used  Substance Use Topics   Alcohol use: No   Drug use: Not  Currently    Types: Marijuana    Comment: endorses prior marijuana use 1999     Family Hx: The patient's family history includes Breast cancer (age of onset: 91) in an other family member; Cataracts in her mother; Coronary artery disease in an other family member; Hypertension in her mother.  ROS:   Please see the history of present illness.    Review of Systems  Constitutional: Negative.   HENT: Negative.    Respiratory:  Negative.    Cardiovascular:  Positive for leg swelling.  Gastrointestinal: Negative.   Musculoskeletal: Negative.   Neurological: Negative.   Psychiatric/Behavioral: Negative.    All other systems reviewed and are negative.   Labs/Other Tests and Data Reviewed:    Recent Labs: 02/22/2021: ALT 15; BUN 29; Creatinine, Ser 1.20; Hemoglobin 11.2; Platelets 273; Potassium 4.0; Sodium 139   Recent Lipid Panel Lab Results  Component Value Date/Time   CHOL 141 03/28/2017 06:39 AM   TRIG 40 03/28/2017 06:39 AM   HDL 42 03/28/2017 06:39 AM   CHOLHDL 3.4 03/28/2017 06:39 AM   LDLCALC 91 03/28/2017 06:39 AM    Wt Readings from Last 3 Encounters:  05/10/21 240 lb (108.9 kg)  01/01/21 244 lb (110.7 kg)  12/11/20 225 lb (102.1 kg)     Exam:    Vital Signs: Vital signs may also be detailed in the HPI BP (!) 150/80 (BP Location: Left Arm, Patient Position: Sitting, Cuff Size: Large)   Pulse (!) 55   Ht _0  (1.753 m)   Wt 240 lb (108.9 kg)   SpO2 96%   BMI 35.44 kg/m   Constitutional:  oriented to person, place, and time. No distress.  HENT:  Head: Grossly normal Eyes:  no discharge. No scleral icterus.  Neck: No JVD, no carotid bruits  Cardiovascular: Regular rate and rhythm, no murmurs appreciated 2+ pitting shiny edema lower extremities Pulmonary/Chest: Clear to auscultation bilaterally, no wheezes or rails Abdominal: Soft.  no distension.  no tenderness.  Musculoskeletal: Normal range of motion Neurological:  normal muscle tone. Coordination normal.  No atrophy Skin: Skin warm and dry Psychiatric: normal affect, pleasant   ASSESSMENT & PLAN:    Chronic diastolic CHF Weight up 15 pounds from prior clinic visit now 240 Suspect some medication noncompliance, sitting with legs down, high fluid intake Noncompliant with her lymphedema compression pumps Unable to place TEDS or  wraps Previously  followed by the wound clinic Has Unna wrap in place on the left Recommend she increase torsemide up to 40 twice daily until weight 225 At 225 pounds we will go back to torsemide 40 daily  Morbid obesity ([place]) Unable to exercise, recommended calorie restriction, aggressive management of her lymphedema  SOB (shortness of breath) On torsemide as above  Essential hypertension Blood pressure is well controlled on today's visit. No changes made to the medications.  OSA on CPAP Current mask on new machine does not fit her face History of noncompliance  Lymphedema Stressed compliance with her lymphedema compression pumps    Total encounter time more than 25 minutes  Greater than 50% was spent in counseling and coordination of care with the patient   Signed, Ida Rogue, MD  05/10/2021 4:38 PM    [place] Office [address], [place], [place] [address]

## 2021-05-10 NOTE — Telephone Encounter (Signed)
Pt  OV today with Dr. Rockey Situ Pt was able to verify her number on file is correct as we have tried to reach out to her after her call in  Per OV notes  Lympherdema pumps twice a day, first thing in Am and last thing in the day  Medication Instructions:   torsemide  40 mg in the AM and 40 mg after lunch For when weight is 240 lbs or greater  40 mg daily  when you weight is 225 or below  Cut back on the sweet tea/water  We will see pt back on 06/12/2021 for 1 month f/u with Dr. Rockey Situ

## 2021-05-10 NOTE — Patient Instructions (Addendum)
Lympherdema pumps twice a day, first thing in Am and last thing in the day  Medication Instructions:   torsemide  40 mg in the AM and 40 mg after lunch For when weight is 240 lbs or greater  40 mg daily  when you weight is 225 or below  Cut back on the sweet tea/water  If you need a refill on your cardiac medications before your next appointment, please call your pharmacy.   Lab work: No new labs needed  Testing/Procedures: No new testing needed   Follow-Up: At [facility], you and your health needs are our priority.  As part of our continuing mission to provide you with exceptional heart care, we have created designated Provider Care Teams.  These Care Teams include your primary Cardiologist (physician) and Advanced Practice Providers (APPs -  Physician Assistants and Nurse Practitioners) who all work together to provide you with the care you need, when you need it.  You will need a follow up appointment in 1 month  Providers on your designated Care Team:   Murray Hodgkins, NP Christell Faith, PA-C Marrianne Mood, PA-C [place], [place]  COVID-19 Vaccine Information can be found at: [email or web address] For questions related to vaccine distribution or appointments, please email [email or web address] or call [phone].

## 2021-05-14 ENCOUNTER — Encounter: Payer: Medicare Other | Attending: Physician Assistant | Admitting: Physician Assistant

## 2021-05-14 ENCOUNTER — Other Ambulatory Visit: Payer: Self-pay

## 2021-05-14 DIAGNOSIS — I87332 Chronic venous hypertension (idiopathic) with ulcer and inflammation of left lower extremity: Secondary | ICD-10-CM | POA: Insufficient documentation

## 2021-05-14 DIAGNOSIS — L97828 Non-pressure chronic ulcer of other part of left lower leg with other specified severity: Secondary | ICD-10-CM | POA: Insufficient documentation

## 2021-05-14 DIAGNOSIS — I890 Lymphedema, not elsewhere classified: Secondary | ICD-10-CM | POA: Diagnosis not present

## 2021-05-14 DIAGNOSIS — L97829 Non-pressure chronic ulcer of other part of left lower leg with unspecified severity: Secondary | ICD-10-CM | POA: Diagnosis present

## 2021-05-14 NOTE — Progress Notes (Addendum)
DESTENI, HEINBAUGH ([id number]) Visit Report for 05/14/2021 Chief Complaint Document Details Patient Name: [name], [name] Date of Service: 05/14/2021 12:30 PM Medical Record Number: [id number] Patient Account Number: [id number] Date of Birth/Sex: Aug 05, 1946 (75 y.o. F) Treating RN: Carlene Coria Primary Care Provider: [name] Other Clinician: Referring Provider: [name] Treating Provider/Extender: Skipper Cliche in Treatment: 16 Information Obtained from: Patient Chief Complaint Left LE Ulcer Electronic Signature(s) Signed: 05/14/2021 12:55:49 PM By: Worthy Keeler PA-C Entered By: Worthy Keeler on 05/14/2021 12:55:49 [name] ([id number]) -------------------------------------------------------------------------------- HPI Details Patient Name: [name] Date of Service: 05/14/2021 12:30 PM Medical Record Number: [id number] Patient Account Number: [id number] Date of Birth/Sex: 12-17-1945 (75 y.o. F) Treating RN: Carlene Coria Primary Care Provider: [name] Other Clinician: Referring Provider: [name] Treating Provider/Extender: Skipper Cliche in Treatment: 16 History of Present Illness HPI Description: 03/15/2020 upon evaluation today patient presents today for initial evaluation here in our clinic concerning a wound that is on the left posterior lower extremity. Unfortunately this has been giving the patient some discomfort at this point which she notes has been affected her pretty much daily. The patient does have a history of venous insufficiency, hypertension, congestive heart failure, and a history of breast cancer. Fortunately there does not appear to be evidence of active infection at this time which is great news. No fevers, chills, nausea, vomiting, or diarrhea. Wound is not extremely large but does have some slough covering the surface of the wound. This is going require sharp debridement today. 03/15/2020 upon evaluation today patient  appears to be doing fairly well in regard to her wound. She does have some slough noted on the surface of the wound currently but I do believe the Iodoflex has been beneficial over the past week. There is no signs of active infection at this time which is great news and overall very pleased with the progress. No fevers, chills, nausea, vomiting, or diarrhea. 04/02/2020 upon evaluation today patient appears to be doing a little better in regard to her wound size wise this is not tremendously smaller she does have some slough buildup on the surface of the wound. With that being said this is going require some sharp debridement today to clear away some of the necrotic debris. Fortunately there is no evidence of active infection at this time. No fevers, chills, nausea, vomiting, or diarrhea. 04/10/2020 on evaluation today patient appears to be doing well with regard to her ulcer which is measuring somewhat smaller today. She actually has more granulation tissue noted which is also good news is no need for sharp debridement today. Fortunately there is no evidence of infection either which is also excellent. No fevers, chills, nausea, vomiting, or diarrhea. 04/26/2020 on evaluation today patient's wound actually is showing signs of good improvement which is great news. There does not appear to be any evidence of active infection. I do believe she is tolerating the collagen at this point. 05/03/2020 upon evaluation today patient's wound actually showed signs of good granulation at this time there does not appear to be any evidence of active infection which is great news and overall very pleased with where things stand. With that being said I do believe that the patient is can require some sharp debridement today but fortunately nothing too significant. 05/11/20 on evaluation today patient appears to be doing well in regard to her leg ulcer. She's been tolerating the dressing changes without complication.  Fortunately there is no signs of active infection at this time.  Overall I'm very pleased with where things stand. 05/18/2020 upon evaluation today patient's wound is showing signs of improvement is very dry and I think the collagen for the most part just dried to the wound bed. I am going to clean this away with sharp debridement in order to get this under control in my opinion. 05/25/2020 upon evaluation today patient actually appears to be doing well in regard to her leg ulcer. In fact upon inspection it appears she could potentially be completely healed but that is not guaranteed based on what we are seeing. There does not appear to be any signs of active infection which is great news. 05/31/2020 on evaluation today patient appears to be doing well with regard to her wounds. She in fact appears to be almost completely healed on the left leg there is just a very small opening remaining point 1 06/08/2020 upon evaluation today patient appears to be doing excellent in regard to her leg ulcer on the left in fact it appears to be that she is completely healed as of today. I see no signs of active infection at this time which is great news. No fevers, chills, nausea, vomiting, or diarrhea. READMISSION 01/16/2021 Patient that we have had in this clinic with a wound on the left posterior calf in the summer 2021 extending into October. She was felt to have chronic venous insufficiency. Per the patient's description she was discharged with what sounds like external compression stockings although she could not afford the "$75 per leg". She therefore did not get anything. Apparently her wound that she has currently is been in existence since January. She has been followed at vein and vascular with Unna boots and I think calcium alginate. She had ABIs done on 06/25/2020 that showed an noncompressible ABI on the right leg and the left leg but triphasic waveforms with good great toe pressures and a TBI of 0.90 on the  right and 0.91 on the left Surprisingly the patient also had venous reflux studies that were really unremarkable. This included no evidence of DVTs or SVTs but there was no evidence of deep venous insufficiency or superficial venous insufficiency in the greater saphenous or short saphenous veins bilaterally. I would wonder about more central venous issues or perhaps this is all lymphedema 01/25/2021 upon evaluation today patient appears to be doing decently well and guard to her wound. The good news is the Iodoflex that is job she saw Dr. Dellia Nims last week for readmission and to be honest I think that this has done extremely well over that week. I do believe that she can tolerate the sharp debridement today which will be good. I think that cleaning off the wound will likely allow [place] to be able to use a different type of dressing I do not think the Iodoflex will even be necessary based on how clean the wound looks. Fortunately there is no sign of active infection at this time which is great news. No fevers, chills, nausea, vomiting, or diarrhea. 02/12/2021 upon evaluation today patient appears to be doing well with regard to her leg ulcer. We did switch to alginate when she came for nurse visit I think is doing much better for her. Fortunately there does not appear to be any signs of active infection at this time which is great news. No fevers, chills, nausea, vomiting, or diarrhea. [name], [name] ([id number]) 02/18/2021 upon evaluation today patient's wound is actually showing signs of good improvement. I am very pleased with where things stand  currently. I do not see any signs of active infection which is great news and overall I feel like she is making good progress. The patient likewise is happy that things are doing so well and that this is measuring somewhat smaller. 02/25/2021 upon evaluation today patient actually appears to be doing quite well in regard to her wounds. She has been tolerating the  dressing changes without complication. Fortunately there does not appear to be any signs of active infection which is great news and overall I am extremely pleased with where things stand. No fevers, chills, nausea, vomiting, or diarrhea. She does have a lot of edema I really do think she needs compression socks to be worn in order to prevent things from causing additional issues for her currently. Especially on the right leg she should be wearing compression socks all the time to be honest. 03/04/2021 upon evaluation today patient's leg ulcer actually appears to be doing pretty well which is great news. There does not appear to be any significant change but overall the appearance is better even though the size is not necessarily reflecting a great improvement. Fortunately there does not appear to be any signs of active infection. No fevers, chills, nausea, vomiting, or diarrhea. 03/12/2021 upon evaluation today patient appears to be doing well with regard to her wounds. She has been tolerating the dressing changes without complication. The good news is that the wound on her left lateral leg is doing much better and is showing signs of good improvement. Overall her swelling is controlled well as well. She does need some compression socks she plans to go Jefferey socks next week. 03/18/2021 on evaluation today patient's wound actually is showing signs of good improvement. She does have a little bit of slough this can require some sharp debridement today. 03/25/2021 upon evaluation today patient appears to be doing well with regard to her wound on the left lateral leg. She has been tolerating the dressing changes without complication. Fortunately there does not appear to be any signs of active infection at this time. No fever chills no 04/01/2021 upon evaluation today patient's wound is showing signs of improvement and overall very pleased with where things stand at this point. There is no evidence of active  infection which is great news as well and in general I think that she is making great progress. 04/09/2021 upon evaluation today patient appears to be doing well with regard to her ankle ulcer. She is making good progress currently which is great news. There does not appear to be any signs of infection also excellent news. In general I am extremely pleased with where things stand today. No fevers, chills, nausea, vomiting, or diarrhea. 04/23/2021 upon inspection today patient appears to be doing quite well with regard to her leg ulcer. She has been tolerating the dressing changes without complication. She is can require some sharp debridement today but overall seems to be doing excellent. 05/06/2021 upon evaluation today patient unfortunately appears to be doing poorly overall in regard to her swelling. She is actually significantly more swollen than last week despite the fact that her wound is doing better this is not a good trend. I think that she probably needs to see her cardiologist ASAP and I discussed that with her today. This includes the fact that honestly I think she probably is getting need an increase in her diuretic or something to try to clear away some of the excess fluid that she is experiencing at the moment. She is  not been doing her pumps even twice a day but again right now more concerned that if she were to do the pump she would fluid overload her lungs causing other issues as far as her breathing is concerned. Obviously I think she needs to contact them and move up the appointment for the 12th of something sooner 2 weeks is a bit too long to wait with how swollen she has. 05/14/2021 upon evaluation today patient's wound actually showing signs of being a little bit smaller compared to previous. She has been making good progress however. Fortunately there does not appear to be any evidence of active infection which is great. Overall I am extremely pleased in that regard. Nonetheless I am  still concerned that she is having quite a bit of issue as far as the swelling is concerned she has been placed on fluid restriction as well as an increase in the furosemide by her doctor. We will see how things progress. Electronic Signature(s) Signed: 05/14/2021 5:14:19 PM By: Worthy Keeler PA-C Entered By: Worthy Keeler on 05/14/2021 17:14:19 [name] ([id number]) -------------------------------------------------------------------------------- Physical Exam Details Patient Name: [name] Date of Service: 05/14/2021 12:30 PM Medical Record Number: [id number] Patient Account Number: [id number] Date of Birth/Sex: 1946-08-17 (74 y.o. F) Treating RN: Carlene Coria Primary Care Provider: [name] Other Clinician: Referring Provider: [name] Treating Provider/Extender: Skipper Cliche in Treatment: 75 Constitutional Well-nourished and well-hydrated in no acute distress. Respiratory normal breathing without difficulty. Psychiatric this patient is able to make decisions and demonstrates good insight into disease process. Alert and Oriented x 3. pleasant and cooperative. Notes Upon inspection patient's wound bed actually showed signs again of good granulation epithelization no sharp debridement was necessary to a tremendous degree although I did have to clear away some of the necrotic debris today she tolerated that utilizing the saline and gauze alone again no sharp debridement was necessary. Electronic Signature(s) Signed: 05/14/2021 5:14:52 PM By: Worthy Keeler PA-C Entered By: Worthy Keeler on 05/14/2021 17:14:52 [name] ([id number]) -------------------------------------------------------------------------------- Physician Orders Details Patient Name: [name] Date of Service: 05/14/2021 12:30 PM Medical Record Number: [id number] Patient Account Number: [id number] Date of Birth/Sex: 20-Oct-1945 (75 y.o. F) Treating RN: Carlene Coria Primary  Care Provider: [name] Other Clinician: Referring Provider: [name] Treating Provider/Extender: Skipper Cliche in Treatment: 16 Verbal / Phone Orders: No Diagnosis Coding ICD-10 Coding Code Description I89.0 Lymphedema, not elsewhere classified I87.332 Chronic venous hypertension (idiopathic) with ulcer and inflammation of left lower extremity L97.828 Non-pressure chronic ulcer of other part of left lower leg with other specified severity Follow-up Appointments o Return Appointment in 1 week. Bathing/ Shower/ Hygiene o May shower with wound dressing protected with water repellent cover or cast protector. Edema Control - Lymphedema / Segmental Compressive Device / Other Left Lower Extremity o Optional: One layer of unna paste to top of compression wrap (to act as an anchor). o Tubigrip single layer applied. - RIGHT LEG tubi grip applied "E" until you get your compression sock use this and it's hand washable o Patient to wear own compression stockings. Remove compression stockings every night before going to bed and put on every morning when getting up. - on RIGHT LEG call [facility] for compression sock 15-22 mm/Hg, measurements and phone number on blue sheet [employer] on [employer] in [place] also has the compression socks o Elevate, Exercise Daily and Avoid Standing for Long Periods of Time. o Elevate legs to the level of the  heart and pump ankles as often as possible o Elevate leg(s) parallel to the floor when sitting. o Compression Pump: Use compression pump on left lower extremity for 60 minutes, twice daily. - You may pump over wraps o Compression Pump: Use compression pump on right lower extremity for 60 minutes, twice daily. - You may pump over wraps Additional Orders / Instructions Wound #2 Left,Lateral Lower Leg o Follow Nutritious Diet and Increase Protein Intake - Increase protein intake. Wound Treatment Wound #2 - Lower  Leg Wound Laterality: Left, Lateral Cleanser: Soap and Water 1 x Per Week/30 Days Discharge Instructions: Gently cleanse wound with antibacterial soap, rinse and pat dry prior to dressing wounds Topical: Triamcinolone Acetonide Cream, 0.1%, 15 (g) tube 1 x Per Week/30 Days Discharge Instructions: Apply as directed by provider. Primary Dressing: Silvercel Small 2x2 (in/in) 1 x Per Week/30 Days Discharge Instructions: Apply Silvercel Small 2x2 (in/in) as instructed Secondary Dressing: Xtrasorb Medium 4x5 (in/in) 1 x Per Week/30 Days Discharge Instructions: Apply to wound as directed. Do not cut. Compression Wrap: Profore Lite LF 3 Multilayer Compression Bandaging System 1 x Per Week/30 Days Discharge Instructions: Apply 3 multi-layer wrap as prescribed. Electronic Signature(s) Signed: 05/17/2021 11:43:44 AM By: Worthy Keeler PA-C Signed: 05/22/2021 7:54:52 AM By: Carlene Coria RN Entered By: Carlene Coria on 05/14/2021 13:05:49 [name], [name] ([id number]) [name], [name] ([id number]) -------------------------------------------------------------------------------- Problem List Details Patient Name: [name] Date of Service: 05/14/2021 12:30 PM Medical Record Number: [id number] Patient Account Number: [id number] Date of Birth/Sex: September 19, 1945 (75 y.o. F) Treating RN: Carlene Coria Primary Care Provider: [name] Other Clinician: Referring Provider: [name] Treating Provider/Extender: Skipper Cliche in Treatment: 16 Active Problems ICD-10 Encounter Code Description Active Date MDM Diagnosis I89.0 Lymphedema, not elsewhere classified 01/16/2021 No Yes I87.332 Chronic venous hypertension (idiopathic) with ulcer and inflammation of 01/16/2021 No Yes left lower extremity L97.828 Non-pressure chronic ulcer of other part of left lower leg with other 01/16/2021 No Yes specified severity Inactive Problems Resolved Problems Electronic Signature(s) Signed: 05/14/2021 12:55:16 PM  By: Worthy Keeler PA-C Entered By: Worthy Keeler on 05/14/2021 12:55:15 [name] ([id number]) -------------------------------------------------------------------------------- Progress Note Details Patient Name: [name] Date of Service: 05/14/2021 12:30 PM Medical Record Number: [id number] Patient Account Number: [id number] Date of Birth/Sex: Mar 05, 1946 (75 y.o. F) Treating RN: Carlene Coria Primary Care Provider: [name] Other Clinician: Referring Provider: [name] Treating Provider/Extender: Skipper Cliche in Treatment: 16 Subjective Chief Complaint Information obtained from Patient Left LE Ulcer History of Present Illness (HPI) 03/15/2020 upon evaluation today patient presents today for initial evaluation here in our clinic concerning a wound that is on the left posterior lower extremity. Unfortunately this has been giving the patient some discomfort at this point which she notes has been affected her pretty much daily. The patient does have a history of venous insufficiency, hypertension, congestive heart failure, and a history of breast cancer. Fortunately there does not appear to be evidence of active infection at this time which is great news. No fevers, chills, nausea, vomiting, or diarrhea. Wound is not extremely large but does have some slough covering the surface of the wound. This is going require sharp debridement today. 03/15/2020 upon evaluation today patient appears to be doing fairly well in regard to her wound. She does have some slough noted on the surface of the wound currently but I do believe the Iodoflex has been beneficial over the past week. There is no signs of active infection at this time which is great  news and overall very pleased with the progress. No fevers, chills, nausea, vomiting, or diarrhea. 04/02/2020 upon evaluation today patient appears to be doing a little better in regard to her wound size wise this is not tremendously  smaller she does have some slough buildup on the surface of the wound. With that being said this is going require some sharp debridement today to clear away some of the necrotic debris. Fortunately there is no evidence of active infection at this time. No fevers, chills, nausea, vomiting, or diarrhea. 04/10/2020 on evaluation today patient appears to be doing well with regard to her ulcer which is measuring somewhat smaller today. She actually has more granulation tissue noted which is also good news is no need for sharp debridement today. Fortunately there is no evidence of infection either which is also excellent. No fevers, chills, nausea, vomiting, or diarrhea. 04/26/2020 on evaluation today patient's wound actually is showing signs of good improvement which is great news. There does not appear to be any evidence of active infection. I do believe she is tolerating the collagen at this point. 05/03/2020 upon evaluation today patient's wound actually showed signs of good granulation at this time there does not appear to be any evidence of active infection which is great news and overall very pleased with where things stand. With that being said I do believe that the patient is can require some sharp debridement today but fortunately nothing too significant. 05/11/20 on evaluation today patient appears to be doing well in regard to her leg ulcer. She's been tolerating the dressing changes without complication. Fortunately there is no signs of active infection at this time. Overall I'm very pleased with where things stand. 05/18/2020 upon evaluation today patient's wound is showing signs of improvement is very dry and I think the collagen for the most part just dried to the wound bed. I am going to clean this away with sharp debridement in order to get this under control in my opinion. 05/25/2020 upon evaluation today patient actually appears to be doing well in regard to her leg ulcer. In fact upon  inspection it appears she could potentially be completely healed but that is not guaranteed based on what we are seeing. There does not appear to be any signs of active infection which is great news. 05/31/2020 on evaluation today patient appears to be doing well with regard to her wounds. She in fact appears to be almost completely healed on the left leg there is just a very small opening remaining point 1 06/08/2020 upon evaluation today patient appears to be doing excellent in regard to her leg ulcer on the left in fact it appears to be that she is completely healed as of today. I see no signs of active infection at this time which is great news. No fevers, chills, nausea, vomiting, or diarrhea. READMISSION 01/16/2021 Patient that we have had in this clinic with a wound on the left posterior calf in the summer 2021 extending into October. She was felt to have chronic venous insufficiency. Per the patient's description she was discharged with what sounds like external compression stockings although she could not afford the "$75 per leg". She therefore did not get anything. Apparently her wound that she has currently is been in existence since January. She has been followed at vein and vascular with Unna boots and I think calcium alginate. She had ABIs done on 06/25/2020 that showed an noncompressible ABI on the right leg and the left leg but triphasic  waveforms with good great toe pressures and a TBI of 0.90 on the right and 0.91 on the left Surprisingly the patient also had venous reflux studies that were really unremarkable. This included no evidence of DVTs or SVTs but there was no evidence of deep venous insufficiency or superficial venous insufficiency in the greater saphenous or short saphenous veins bilaterally. I would wonder about more central venous issues or perhaps this is all lymphedema 01/25/2021 upon evaluation today patient appears to be doing decently well and guard to her wound. The  good news is the Iodoflex that is job she saw Dr. Dellia Nims last week for readmission and to be honest I think that this has done extremely well over that week. I do believe that she can tolerate the sharp debridement today which will be good. I think that cleaning off the wound will likely allow [place] to be able to use a different type of dressing I do not think the Iodoflex will even be necessary based on how clean the wound looks. Fortunately there is no sign of active infection at this time which is great news. No fevers, chills, nausea, vomiting, or diarrhea. [name], [name] ([id number]) 02/12/2021 upon evaluation today patient appears to be doing well with regard to her leg ulcer. We did switch to alginate when she came for nurse visit I think is doing much better for her. Fortunately there does not appear to be any signs of active infection at this time which is great news. No fevers, chills, nausea, vomiting, or diarrhea. 02/18/2021 upon evaluation today patient's wound is actually showing signs of good improvement. I am very pleased with where things stand currently. I do not see any signs of active infection which is great news and overall I feel like she is making good progress. The patient likewise is happy that things are doing so well and that this is measuring somewhat smaller. 02/25/2021 upon evaluation today patient actually appears to be doing quite well in regard to her wounds. She has been tolerating the dressing changes without complication. Fortunately there does not appear to be any signs of active infection which is great news and overall I am extremely pleased with where things stand. No fevers, chills, nausea, vomiting, or diarrhea. She does have a lot of edema I really do think she needs compression socks to be worn in order to prevent things from causing additional issues for her currently. Especially on the right leg she should be wearing compression socks all the time to be  honest. 03/04/2021 upon evaluation today patient's leg ulcer actually appears to be doing pretty well which is great news. There does not appear to be any significant change but overall the appearance is better even though the size is not necessarily reflecting a great improvement. Fortunately there does not appear to be any signs of active infection. No fevers, chills, nausea, vomiting, or diarrhea. 03/12/2021 upon evaluation today patient appears to be doing well with regard to her wounds. She has been tolerating the dressing changes without complication. The good news is that the wound on her left lateral leg is doing much better and is showing signs of good improvement. Overall her swelling is controlled well as well. She does need some compression socks she plans to go Jefferey socks next week. 03/18/2021 on evaluation today patient's wound actually is showing signs of good improvement. She does have a little bit of slough this can require some sharp debridement today. 03/25/2021 upon evaluation today patient appears to  be doing well with regard to her wound on the left lateral leg. She has been tolerating the dressing changes without complication. Fortunately there does not appear to be any signs of active infection at this time. No fever chills no 04/01/2021 upon evaluation today patient's wound is showing signs of improvement and overall very pleased with where things stand at this point. There is no evidence of active infection which is great news as well and in general I think that she is making great progress. 04/09/2021 upon evaluation today patient appears to be doing well with regard to her ankle ulcer. She is making good progress currently which is great news. There does not appear to be any signs of infection also excellent news. In general I am extremely pleased with where things stand today. No fevers, chills, nausea, vomiting, or diarrhea. 04/23/2021 upon inspection today patient appears to  be doing quite well with regard to her leg ulcer. She has been tolerating the dressing changes without complication. She is can require some sharp debridement today but overall seems to be doing excellent. 05/06/2021 upon evaluation today patient unfortunately appears to be doing poorly overall in regard to her swelling. She is actually significantly more swollen than last week despite the fact that her wound is doing better this is not a good trend. I think that she probably needs to see her cardiologist ASAP and I discussed that with her today. This includes the fact that honestly I think she probably is getting need an increase in her diuretic or something to try to clear away some of the excess fluid that she is experiencing at the moment. She is not been doing her pumps even twice a day but again right now more concerned that if she were to do the pump she would fluid overload her lungs causing other issues as far as her breathing is concerned. Obviously I think she needs to contact them and move up the appointment for the 12th of something sooner 2 weeks is a bit too long to wait with how swollen she has. 05/14/2021 upon evaluation today patient's wound actually showing signs of being a little bit smaller compared to previous. She has been making good progress however. Fortunately there does not appear to be any evidence of active infection which is great. Overall I am extremely pleased in that regard. Nonetheless I am still concerned that she is having quite a bit of issue as far as the swelling is concerned she has been placed on fluid restriction as well as an increase in the furosemide by her doctor. We will see how things progress. Objective Constitutional Well-nourished and well-hydrated in no acute distress. Vitals Time Taken: 12:48 PM, Height: 69 in, Weight: 244 lbs, BMI: 36, Temperature: 98.4 F, Pulse: 61 bpm, Respiratory Rate: 18 breaths/min, Blood Pressure: 152/75  mmHg. Respiratory normal breathing without difficulty. Psychiatric this patient is able to make decisions and demonstrates good insight into disease process. Alert and Oriented x 3. pleasant and cooperative. General Notes: Upon inspection patient's wound bed actually showed signs again of good granulation epithelization no sharp debridement was necessary to a tremendous degree although I did have to clear away some of the necrotic debris today she tolerated that utilizing the saline and [name], [name] ([id number]) gauze alone again no sharp debridement was necessary. Integumentary (Hair, Skin) Wound #2 status is Open. Original cause of wound was Gradually Appeared. The date acquired was: 09/08/2020. The wound has been in treatment 16 weeks. The wound is  located on the Left,Lateral Lower Leg. The wound measures 2.2cm length x 1cm width x 0.1cm depth; 1.728cm^2 area and 0.173cm^3 volume. There is Fat Layer (Subcutaneous Tissue) exposed. There is no tunneling or undermining noted. There is a medium amount of serosanguineous drainage noted. There is medium (34-66%) red granulation within the wound bed. There is a medium (34-66%) amount of necrotic tissue within the wound bed including Adherent Slough. 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 There was a Three Layer Compression Therapy Procedure by Carlene Coria, RN. Post procedure Diagnosis Wound #2: Same as Pre-Procedure Plan Follow-up Appointments: Return Appointment in 1 week. Bathing/ Shower/ Hygiene: May shower with wound dressing protected with water repellent cover or cast protector. Edema Control - Lymphedema  / Segmental Compressive Device / Other: Optional: One layer of unna paste to top of compression wrap (to act as an anchor). Tubigrip single layer applied. - RIGHT LEG tubi grip applied "E" until you get your compression sock use this and it's hand washable Patient to wear own compression stockings. Remove compression stockings every night before going to bed and put on every morning when getting up. - on RIGHT LEG call [facility] for compression sock 15-22 mm/Hg, measurements and phone number on blue sheet [employer] on [employer] in [place] also has the compression socks Elevate, Exercise Daily and Avoid Standing for Long Periods of Time. Elevate legs to the level of the heart and pump ankles as often as possible Elevate leg(s) parallel to the floor when sitting. Compression Pump: Use compression pump on left lower extremity for 60 minutes, twice daily. - You may pump over wraps Compression Pump: Use compression pump on right lower extremity for 60 minutes, twice daily. - You may pump over wraps Additional Orders / Instructions: Wound #2 Left,Lateral Lower Leg: Follow Nutritious Diet and Increase Protein Intake - Increase protein intake. WOUND #2: - Lower Leg Wound Laterality: Left, Lateral Cleanser: Soap and Water 1 x Per Week/30 Days Discharge Instructions: Gently cleanse wound with antibacterial soap, rinse and pat dry prior to dressing wounds Topical: Triamcinolone Acetonide Cream, 0.1%, 15 (g) tube 1 x Per Week/30 Days Discharge Instructions: Apply as directed by provider. Primary Dressing: Silvercel Small 2x2 (in/in) 1 x Per Week/30 Days Discharge Instructions: Apply Silvercel Small 2x2 (in/in) as instructed Secondary Dressing: Xtrasorb Medium 4x5 (in/in) 1 x Per Week/30 Days Discharge Instructions: Apply to wound as directed. Do not cut. Compression Wrap: Profore Lite LF 3 Multilayer Compression Bandaging System 1 x Per Week/30 Days Discharge Instructions: Apply 3  multi-layer wrap as prescribed. 1. Would recommend currently that we going continue with the wound care measures as before and the patient is in agreement with the plan this includes the use of the silver alginate dressing which I think is doing a good job. [place], Dhiya ([id number]) 2. We will use XtraSorb and/or Zetuvit to cover this. 3. We will continue with the 3 layer compression wrap for the time being. We will see patient back for reevaluation in 1 week here in the clinic. If anything worsens or changes patient will contact our office for additional recommendations. Electronic Signature(s) Signed: 05/14/2021 5:15:18 PM By: Worthy Keeler PA-C Entered By: Worthy Keeler on 05/14/2021 17:15:18  [name], [name] ([id number]) -------------------------------------------------------------------------------- [facility] Details Patient Name: [name], [name] Date of Service: 05/14/2021 Medical Record Number: [id number] Patient Account Number: [id number] Date of Birth/Sex: [birth year] (75 y.o. 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 L97.828 Non-pressure chronic ulcer of other part of left lower leg with other specified severity Facility Procedures CPT4 Code: [id number] Description: (Facility Use Only) [id number] - [facility] LWR LT LEG Modifier: Quantity: 1 Physician Procedures CPT4 Code Description: [id number] - WC PHYS LEVEL 4 - EST PT Modifier: Quantity: 1 CPT4 Code Description: ICD-10 Diagnosis Description I89.0 Lymphedema, not elsewhere classified I87.332 Chronic venous hypertension (idiopathic) with ulcer and inflammation of le L97.828  Non-pressure chronic ulcer of other part of left lower leg with  other spec Modifier: ft lower extremity ified severity Quantity: Electronic Signature(s) Signed: 05/14/2021 5:16:14 PM By: Worthy Keeler PA-C Entered By: Worthy Keeler on 05/14/2021 [place]

## 2021-05-20 ENCOUNTER — Ambulatory Visit: Payer: Medicare HMO | Admitting: Cardiovascular Disease

## 2021-05-21 ENCOUNTER — Other Ambulatory Visit: Payer: Self-pay

## 2021-05-21 ENCOUNTER — Encounter: Payer: Medicare Other | Admitting: Physician Assistant

## 2021-05-21 DIAGNOSIS — I87332 Chronic venous hypertension (idiopathic) with ulcer and inflammation of left lower extremity: Secondary | ICD-10-CM | POA: Diagnosis not present

## 2021-05-21 NOTE — Progress Notes (Addendum)
[name], [name] ([id number]) Visit Report for 05/21/2021 Chief Complaint Document Details Patient Name: [name] Date of Service: 05/21/2021 12:30 PM Medical Record Number: [id number] Patient Account Number: [id number] Date of Birth/Sex: November 03, 1945 (75 y.o. F) Treating RN: Carlene Coria Primary Care Provider: [name] Other Clinician: Referring Provider: [name] Treating Provider/Extender: Skipper Cliche in Treatment: 17 Information Obtained from: Patient Chief Complaint Left LE Ulcer Electronic Signature(s) Signed: 05/21/2021 12:58:11 PM By: Worthy Keeler PA-C Entered By: Worthy Keeler on 05/21/2021 12:58:11 [name] ([id number]) -------------------------------------------------------------------------------- HPI Details Patient Name: [name] Date of Service: 05/21/2021 12:30 PM Medical Record Number: [id number] Patient Account Number: [id number] Date of Birth/Sex: December 07, 1945 (75 y.o. F) Treating RN: Carlene Coria Primary Care Provider: [name] Other Clinician: Referring Provider: [name] Treating Provider/Extender: Skipper Cliche in Treatment: 17 History of Present Illness HPI Description: 03/15/2020 upon evaluation today patient presents today for initial evaluation here in our clinic concerning a wound that is on the left posterior lower extremity. Unfortunately this has been giving the patient some discomfort at this point which she notes has been affected her pretty much daily. The patient does have a history of venous insufficiency, hypertension, congestive heart failure, and a history of breast cancer. Fortunately there does not appear to be evidence of active infection at this time which is great news. No fevers, chills, nausea, vomiting, or diarrhea. Wound is not extremely large but does have some slough covering the surface of the wound. This is going require sharp debridement today. 03/15/2020 upon evaluation today patient  appears to be doing fairly well in regard to her wound. She does have some slough noted on the surface of the wound currently but I do believe the Iodoflex has been beneficial over the past week. There is no signs of active infection at this time which is great news and overall very pleased with the progress. No fevers, chills, nausea, vomiting, or diarrhea. 04/02/2020 upon evaluation today patient appears to be doing a little better in regard to her wound size wise this is not tremendously smaller she does have some slough buildup on the surface of the wound. With that being said this is going require some sharp debridement today to clear away some of the necrotic debris. Fortunately there is no evidence of active infection at this time. No fevers, chills, nausea, vomiting, or diarrhea. 04/10/2020 on evaluation today patient appears to be doing well with regard to her ulcer which is measuring somewhat smaller today. She actually has more granulation tissue noted which is also good news is no need for sharp debridement today. Fortunately there is no evidence of infection either which is also excellent. No fevers, chills, nausea, vomiting, or diarrhea. 04/26/2020 on evaluation today patient's wound actually is showing signs of good improvement which is great news. There does not appear to be any evidence of active infection. I do believe she is tolerating the collagen at this point. 05/03/2020 upon evaluation today patient's wound actually showed signs of good granulation at this time there does not appear to be any evidence of active infection which is great news and overall very pleased with where things stand. With that being said I do believe that the patient is can require some sharp debridement today but fortunately nothing too significant. 05/11/20 on evaluation today patient appears to be doing well in regard to her leg ulcer. She's been tolerating the dressing changes without complication.  Fortunately there is no signs of active infection at this time.  Overall I'm very pleased with where things stand. 05/18/2020 upon evaluation today patient's wound is showing signs of improvement is very dry and I think the collagen for the most part just dried to the wound bed. I am going to clean this away with sharp debridement in order to get this under control in my opinion. 05/25/2020 upon evaluation today patient actually appears to be doing well in regard to her leg ulcer. In fact upon inspection it appears she could potentially be completely healed but that is not guaranteed based on what we are seeing. There does not appear to be any signs of active infection which is great news. 05/31/2020 on evaluation today patient appears to be doing well with regard to her wounds. She in fact appears to be almost completely healed on the left leg there is just a very small opening remaining point 1 06/08/2020 upon evaluation today patient appears to be doing excellent in regard to her leg ulcer on the left in fact it appears to be that she is completely healed as of today. I see no signs of active infection at this time which is great news. No fevers, chills, nausea, vomiting, or diarrhea. READMISSION 01/16/2021 Patient that we have had in this clinic with a wound on the left posterior calf in the summer 2021 extending into October. She was felt to have chronic venous insufficiency. Per the patient's description she was discharged with what sounds like external compression stockings although she could not afford the "$75 per leg". She therefore did not get anything. Apparently her wound that she has currently is been in existence since January. She has been followed at vein and vascular with Unna boots and I think calcium alginate. She had ABIs done on 06/25/2020 that showed an noncompressible ABI on the right leg and the left leg but triphasic waveforms with good great toe pressures and a TBI of 0.90 on the  right and 0.91 on the left Surprisingly the patient also had venous reflux studies that were really unremarkable. This included no evidence of DVTs or SVTs but there was no evidence of deep venous insufficiency or superficial venous insufficiency in the greater saphenous or short saphenous veins bilaterally. I would wonder about more central venous issues or perhaps this is all lymphedema 01/25/2021 upon evaluation today patient appears to be doing decently well and guard to her wound. The good news is the Iodoflex that is job she saw Dr. Dellia Nims last week for readmission and to be honest I think that this has done extremely well over that week. I do believe that she can tolerate the sharp debridement today which will be good. I think that cleaning off the wound will likely allow [place] to be able to use a different type of dressing I do not think the Iodoflex will even be necessary based on how clean the wound looks. Fortunately there is no sign of active infection at this time which is great news. No fevers, chills, nausea, vomiting, or diarrhea. 02/12/2021 upon evaluation today patient appears to be doing well with regard to her leg ulcer. We did switch to alginate when she came for nurse visit I think is doing much better for her. Fortunately there does not appear to be any signs of active infection at this time which is great news. No fevers, chills, nausea, vomiting, or diarrhea. [name], [name] ([id number]) 02/18/2021 upon evaluation today patient's wound is actually showing signs of good improvement. I am very pleased with where things stand  currently. I do not see any signs of active infection which is great news and overall I feel like she is making good progress. The patient likewise is happy that things are doing so well and that this is measuring somewhat smaller. 02/25/2021 upon evaluation today patient actually appears to be doing quite well in regard to her wounds. She has been tolerating the  dressing changes without complication. Fortunately there does not appear to be any signs of active infection which is great news and overall I am extremely pleased with where things stand. No fevers, chills, nausea, vomiting, or diarrhea. She does have a lot of edema I really do think she needs compression socks to be worn in order to prevent things from causing additional issues for her currently. Especially on the right leg she should be wearing compression socks all the time to be honest. 03/04/2021 upon evaluation today patient's leg ulcer actually appears to be doing pretty well which is great news. There does not appear to be any significant change but overall the appearance is better even though the size is not necessarily reflecting a great improvement. Fortunately there does not appear to be any signs of active infection. No fevers, chills, nausea, vomiting, or diarrhea. 03/12/2021 upon evaluation today patient appears to be doing well with regard to her wounds. She has been tolerating the dressing changes without complication. The good news is that the wound on her left lateral leg is doing much better and is showing signs of good improvement. Overall her swelling is controlled well as well. She does need some compression socks she plans to go Jefferey socks next week. 03/18/2021 on evaluation today patient's wound actually is showing signs of good improvement. She does have a little bit of slough this can require some sharp debridement today. 03/25/2021 upon evaluation today patient appears to be doing well with regard to her wound on the left lateral leg. She has been tolerating the dressing changes without complication. Fortunately there does not appear to be any signs of active infection at this time. No fever chills no 04/01/2021 upon evaluation today patient's wound is showing signs of improvement and overall very pleased with where things stand at this point. There is no evidence of active  infection which is great news as well and in general I think that she is making great progress. 04/09/2021 upon evaluation today patient appears to be doing well with regard to her ankle ulcer. She is making good progress currently which is great news. There does not appear to be any signs of infection also excellent news. In general I am extremely pleased with where things stand today. No fevers, chills, nausea, vomiting, or diarrhea. 04/23/2021 upon inspection today patient appears to be doing quite well with regard to her leg ulcer. She has been tolerating the dressing changes without complication. She is can require some sharp debridement today but overall seems to be doing excellent. 05/06/2021 upon evaluation today patient unfortunately appears to be doing poorly overall in regard to her swelling. She is actually significantly more swollen than last week despite the fact that her wound is doing better this is not a good trend. I think that she probably needs to see her cardiologist ASAP and I discussed that with her today. This includes the fact that honestly I think she probably is getting need an increase in her diuretic or something to try to clear away some of the excess fluid that she is experiencing at the moment. She is  not been doing her pumps even twice a day but again right now more concerned that if she were to do the pump she would fluid overload her lungs causing other issues as far as her breathing is concerned. Obviously I think she needs to contact them and move up the appointment for the 12th of something sooner 2 weeks is a bit too long to wait with how swollen she has. 05/14/2021 upon evaluation today patient's wound actually showing signs of being a little bit smaller compared to previous. She has been making good progress however. Fortunately there does not appear to be any evidence of active infection which is great. Overall I am extremely pleased in that regard. Nonetheless I am  still concerned that she is having quite a bit of issue as far as the swelling is concerned she has been placed on fluid restriction as well as an increase in the furosemide by her doctor. We will see how things progress. 05/21/2021 upon evaluation today patient appears to be doing well with regard to her wound in general. She has been tolerating the dressing changes without complication. With that being said she has been using silver cell for a while and while this was showing signs of improvement it is now gotten to the point where this has slowed down quite significantly. I do believe that switching to a different dressing may be beneficial for her today. Electronic Signature(s) Signed: 05/21/2021 1:24:53 PM By: Worthy Keeler PA-C Entered By: Worthy Keeler on 05/21/2021 13:24:53 [name], [name] ([id number]) -------------------------------------------------------------------------------- Physical Exam Details Patient Name: [name] Date of Service: 05/21/2021 12:30 PM Medical Record Number: [id number] Patient Account Number: [id number] Date of Birth/Sex: 12-13-1945 (75 y.o. F) Treating RN: Carlene Coria Primary Care Provider: [name] Other Clinician: Referring Provider: [name] Treating Provider/Extender: Skipper Cliche in Treatment: 40 Constitutional Well-nourished and well-hydrated in no acute distress. Respiratory normal breathing without difficulty. Psychiatric this patient is able to make decisions and demonstrates good insight into disease process. Alert and Oriented x 3. pleasant and cooperative. Notes Upon inspection patient's wound bed actually showed signs of good granulation and epithelization at this point. Fortunately there does not appear to be any evidence of infection which is great systemically and locally upon inspection I do not see infection signs and symptoms. Nonetheless I do believe that the patient would benefit from switching up the  dressing I Minna try Hydrofera Blue. Electronic Signature(s) Signed: 05/21/2021 1:25:28 PM By: Worthy Keeler PA-C Entered By: Worthy Keeler on 05/21/2021 13:25:28 [name], [name] ([id number]) -------------------------------------------------------------------------------- Physician Orders Details Patient Name: [name] Date of Service: 05/21/2021 12:30 PM Medical Record Number: [id number] Patient Account Number: [id number] Date of Birth/Sex: 1946-03-05 (75 y.o. F) Treating RN: Carlene Coria Primary Care Provider: [name] Other Clinician: Referring Provider: [name] Treating Provider/Extender: Skipper Cliche in Treatment: 17 Verbal / Phone Orders: No Diagnosis Coding ICD-10 Coding Code Description I89.0 Lymphedema, not elsewhere classified I87.332 Chronic venous hypertension (idiopathic) with ulcer and inflammation of left lower extremity L97.828 Non-pressure chronic ulcer of other part of left lower leg with other specified severity Follow-up Appointments o Return Appointment in 1 week. Bathing/ Shower/ Hygiene o May shower with wound dressing protected with water repellent cover or cast protector. Edema Control - Lymphedema / Segmental Compressive Device / Other Left Lower Extremity o Optional: One layer of unna paste to top of compression wrap (to act as an anchor). o Tubigrip single layer applied. - RIGHT LEG tubi grip applied "  E" until you get your compression sock use this and it's hand washable o Patient to wear own compression stockings. Remove compression stockings every night before going to bed and put on every morning when getting up. - on RIGHT LEG call [employer] for compression sock 15-22 mm/Hg, measurements and phone number on blue sheet [employer] on [employer] in [place] also has the compression socks o Elevate, Exercise Daily and Avoid Standing for Long Periods of Time. o Elevate legs to the level of the heart  and pump ankles as often as possible o Elevate leg(s) parallel to the floor when sitting. o Compression Pump: Use compression pump on left lower extremity for 60 minutes, twice daily. - You may pump over wraps o Compression Pump: Use compression pump on right lower extremity for 60 minutes, twice daily. - You may pump over wraps Additional Orders / Instructions Wound #2 Left,Lateral Lower Leg o Follow Nutritious Diet and Increase Protein Intake - Increase protein intake. Wound Treatment Wound #2 - Lower Leg Wound Laterality: Left, Lateral Cleanser: Soap and Water 1 x Per Week/30 Days Discharge Instructions: Gently cleanse wound with antibacterial soap, rinse and pat dry prior to dressing wounds Primary Dressing: Hydrofera Blue Ready Transfer Foam, 2.5x2.5 (in/in) 1 x Per Week/30 Days Discharge Instructions: Apply Hydrofera Blue Ready to wound bed as directed Secondary Dressing: ABD Pad 5x9 (in/in) 1 x Per Week/30 Days Discharge Instructions: Cover with ABD pad Compression Wrap: Profore Lite LF 3 Multilayer Compression Bandaging System 1 x Per Week/30 Days Discharge Instructions: Apply 3 multi-layer wrap as prescribed. Electronic Signature(s) Signed: 05/21/2021 9:56:09 PM By: Worthy Keeler PA-C Signed: 05/22/2021 7:54:12 AM By: Carlene Coria RN Entered By: Carlene Coria on 05/21/2021 13:04:43 [name] ([id number]) -------------------------------------------------------------------------------- Problem List Details Patient Name: [name] Date of Service: 05/21/2021 12:30 PM Medical Record Number: [id number] Patient Account Number: [id number] Date of Birth/Sex: 1945-11-07 (75 y.o. F) Treating RN: Carlene Coria Primary Care Provider: [name] Other Clinician: Referring Provider: [name] Treating Provider/Extender: Skipper Cliche in Treatment: 17 Active Problems ICD-10 Encounter Code Description Active Date MDM Diagnosis I89.0 Lymphedema, not elsewhere  classified 01/16/2021 No Yes I87.332 Chronic venous hypertension (idiopathic) with ulcer and inflammation of 01/16/2021 No Yes left lower extremity L97.828 Non-pressure chronic ulcer of other part of left lower leg with other 01/16/2021 No Yes specified severity Inactive Problems Resolved Problems Electronic Signature(s) Signed: 05/21/2021 12:57:51 PM By: Worthy Keeler PA-C Entered By: Worthy Keeler on 05/21/2021 12:57:51 [name] ([id number]) -------------------------------------------------------------------------------- Progress Note Details Patient Name: [name] Date of Service: 05/21/2021 12:30 PM Medical Record Number: [id number] Patient Account Number: [id number] Date of Birth/Sex: 12-15-1945 (75 y.o. F) Treating RN: Carlene Coria Primary Care Provider: [name] Other Clinician: Referring Provider: [name] Treating Provider/Extender: Skipper Cliche in Treatment: 17 Subjective Chief Complaint Information obtained from Patient Left LE Ulcer History of Present Illness (HPI) 03/15/2020 upon evaluation today patient presents today for initial evaluation here in our clinic concerning a wound that is on the left posterior lower extremity. Unfortunately this has been giving the patient some discomfort at this point which she notes has been affected her pretty much daily. The patient does have a history of venous insufficiency, hypertension, congestive heart failure, and a history of breast cancer. Fortunately there does not appear to be evidence of active infection at this time which is great news. No fevers, chills, nausea, vomiting, or diarrhea. Wound is not extremely large but does have some slough covering the  surface of the wound. This is going require sharp debridement today. 03/15/2020 upon evaluation today patient appears to be doing fairly well in regard to her wound. She does have some slough noted on the surface of the wound currently but I do believe  the Iodoflex has been beneficial over the past week. There is no signs of active infection at this time which is great news and overall very pleased with the progress. No fevers, chills, nausea, vomiting, or diarrhea. 04/02/2020 upon evaluation today patient appears to be doing a little better in regard to her wound size wise this is not tremendously smaller she does have some slough buildup on the surface of the wound. With that being said this is going require some sharp debridement today to clear away some of the necrotic debris. Fortunately there is no evidence of active infection at this time. No fevers, chills, nausea, vomiting, or diarrhea. 04/10/2020 on evaluation today patient appears to be doing well with regard to her ulcer which is measuring somewhat smaller today. She actually has more granulation tissue noted which is also good news is no need for sharp debridement today. Fortunately there is no evidence of infection either which is also excellent. No fevers, chills, nausea, vomiting, or diarrhea. 04/26/2020 on evaluation today patient's wound actually is showing signs of good improvement which is great news. There does not appear to be any evidence of active infection. I do believe she is tolerating the collagen at this point. 05/03/2020 upon evaluation today patient's wound actually showed signs of good granulation at this time there does not appear to be any evidence of active infection which is great news and overall very pleased with where things stand. With that being said I do believe that the patient is can require some sharp debridement today but fortunately nothing too significant. 05/11/20 on evaluation today patient appears to be doing well in regard to her leg ulcer. She's been tolerating the dressing changes without complication. Fortunately there is no signs of active infection at this time. Overall I'm very pleased with where things stand. 05/18/2020 upon evaluation today  patient's wound is showing signs of improvement is very dry and I think the collagen for the most part just dried to the wound bed. I am going to clean this away with sharp debridement in order to get this under control in my opinion. 05/25/2020 upon evaluation today patient actually appears to be doing well in regard to her leg ulcer. In fact upon inspection it appears she could potentially be completely healed but that is not guaranteed based on what we are seeing. There does not appear to be any signs of active infection which is great news. 05/31/2020 on evaluation today patient appears to be doing well with regard to her wounds. She in fact appears to be almost completely healed on the left leg there is just a very small opening remaining point 1 06/08/2020 upon evaluation today patient appears to be doing excellent in regard to her leg ulcer on the left in fact it appears to be that she is completely healed as of today. I see no signs of active infection at this time which is great news. No fevers, chills, nausea, vomiting, or diarrhea. READMISSION 01/16/2021 Patient that we have had in this clinic with a wound on the left posterior calf in the summer 2021 extending into October. She was felt to have chronic venous insufficiency. Per the patient's description she was discharged with what sounds like external compression  stockings although she could not afford the "$75 per leg". She therefore did not get anything. Apparently her wound that she has currently is been in existence since January. She has been followed at vein and vascular with Unna boots and I think calcium alginate. She had ABIs done on 06/25/2020 that showed an noncompressible ABI on the right leg and the left leg but triphasic waveforms with good great toe pressures and a TBI of 0.90 on the right and 0.91 on the left Surprisingly the patient also had venous reflux studies that were really unremarkable. This included no evidence of  DVTs or SVTs but there was no evidence of deep venous insufficiency or superficial venous insufficiency in the greater saphenous or short saphenous veins bilaterally. I would wonder about more central venous issues or perhaps this is all lymphedema 01/25/2021 upon evaluation today patient appears to be doing decently well and guard to her wound. The good news is the Iodoflex that is job she saw Dr. Dellia Nims last week for readmission and to be honest I think that this has done extremely well over that week. I do believe that she can tolerate the sharp debridement today which will be good. I think that cleaning off the wound will likely allow [place] to be able to use a different type of dressing I do not think the Iodoflex will even be necessary based on how clean the wound looks. Fortunately there is no sign of active infection at this time which is great news. No fevers, chills, nausea, vomiting, or diarrhea. [name], [name] ([id number]) 02/12/2021 upon evaluation today patient appears to be doing well with regard to her leg ulcer. We did switch to alginate when she came for nurse visit I think is doing much better for her. Fortunately there does not appear to be any signs of active infection at this time which is great news. No fevers, chills, nausea, vomiting, or diarrhea. 02/18/2021 upon evaluation today patient's wound is actually showing signs of good improvement. I am very pleased with where things stand currently. I do not see any signs of active infection which is great news and overall I feel like she is making good progress. The patient likewise is happy that things are doing so well and that this is measuring somewhat smaller. 02/25/2021 upon evaluation today patient actually appears to be doing quite well in regard to her wounds. She has been tolerating the dressing changes without complication. Fortunately there does not appear to be any signs of active infection which is great news and overall I  am extremely pleased with where things stand. No fevers, chills, nausea, vomiting, or diarrhea. She does have a lot of edema I really do think she needs compression socks to be worn in order to prevent things from causing additional issues for her currently. Especially on the right leg she should be wearing compression socks all the time to be honest. 03/04/2021 upon evaluation today patient's leg ulcer actually appears to be doing pretty well which is great news. There does not appear to be any significant change but overall the appearance is better even though the size is not necessarily reflecting a great improvement. Fortunately there does not appear to be any signs of active infection. No fevers, chills, nausea, vomiting, or diarrhea. 03/12/2021 upon evaluation today patient appears to be doing well with regard to her wounds. She has been tolerating the dressing changes without complication. The good news is that the wound on her left lateral leg is doing  much better and is showing signs of good improvement. Overall her swelling is controlled well as well. She does need some compression socks she plans to go Jefferey socks next week. 03/18/2021 on evaluation today patient's wound actually is showing signs of good improvement. She does have a little bit of slough this can require some sharp debridement today. 03/25/2021 upon evaluation today patient appears to be doing well with regard to her wound on the left lateral leg. She has been tolerating the dressing changes without complication. Fortunately there does not appear to be any signs of active infection at this time. No fever chills no 04/01/2021 upon evaluation today patient's wound is showing signs of improvement and overall very pleased with where things stand at this point. There is no evidence of active infection which is great news as well and in general I think that she is making great progress. 04/09/2021 upon evaluation today patient appears  to be doing well with regard to her ankle ulcer. She is making good progress currently which is great news. There does not appear to be any signs of infection also excellent news. In general I am extremely pleased with where things stand today. No fevers, chills, nausea, vomiting, or diarrhea. 04/23/2021 upon inspection today patient appears to be doing quite well with regard to her leg ulcer. She has been tolerating the dressing changes without complication. She is can require some sharp debridement today but overall seems to be doing excellent. 05/06/2021 upon evaluation today patient unfortunately appears to be doing poorly overall in regard to her swelling. She is actually significantly more swollen than last week despite the fact that her wound is doing better this is not a good trend. I think that she probably needs to see her cardiologist ASAP and I discussed that with her today. This includes the fact that honestly I think she probably is getting need an increase in her diuretic or something to try to clear away some of the excess fluid that she is experiencing at the moment. She is not been doing her pumps even twice a day but again right now more concerned that if she were to do the pump she would fluid overload her lungs causing other issues as far as her breathing is concerned. Obviously I think she needs to contact them and move up the appointment for the 12th of something sooner 2 weeks is a bit too long to wait with how swollen she has. 05/14/2021 upon evaluation today patient's wound actually showing signs of being a little bit smaller compared to previous. She has been making good progress however. Fortunately there does not appear to be any evidence of active infection which is great. Overall I am extremely pleased in that regard. Nonetheless I am still concerned that she is having quite a bit of issue as far as the swelling is concerned she has been placed on fluid restriction as well  as an increase in the furosemide by her doctor. We will see how things progress. 05/21/2021 upon evaluation today patient appears to be doing well with regard to her wound in general. She has been tolerating the dressing changes without complication. With that being said she has been using silver cell for a while and while this was showing signs of improvement it is now gotten to the point where this has slowed down quite significantly. I do believe that switching to a different dressing may be beneficial for her today. Objective Constitutional Well-nourished and well-hydrated in no acute  distress. Vitals Time Taken: 12:46 PM, Height: 69 in, Weight: 244 lbs, BMI: 36, Temperature: 98.3 F, Pulse: 54 bpm, Respiratory Rate: 18 breaths/min, Blood Pressure: 148/67 mmHg. Respiratory normal breathing without difficulty. Psychiatric [name], [name] ([id number]) this patient is able to make decisions and demonstrates good insight into disease process. Alert and Oriented x 3. pleasant and cooperative. General Notes: Upon inspection patient's wound bed actually showed signs of good granulation and epithelization at this point. Fortunately there does not appear to be any evidence of infection which is great systemically and locally upon inspection I do not see infection signs and symptoms. Nonetheless I do believe that the patient would benefit from switching up the dressing I Minna try Hydrofera Blue. Integumentary (Hair, Skin) Wound #2 status is Open. Original cause of wound was Gradually Appeared. The date acquired was: 09/08/2020. The wound has been in treatment 17 weeks. The wound is located on the Left,Lateral Lower Leg. The wound measures 2.5cm length x 0.8cm width x 0.1cm depth; 1.571cm^2 area and 0.157cm^3 volume. There is Fat Layer (Subcutaneous Tissue) exposed. There is no tunneling or undermining noted. There is a medium amount of serosanguineous drainage noted. There is medium (34-66%) red  granulation within the wound bed. There is a medium (34-66%) amount of necrotic tissue within the wound bed including Adherent Slough. 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 There was a Three Layer Compression Therapy Procedure by Carlene Coria, RN. Post procedure Diagnosis Wound #2: Same as Pre-Procedure Plan Follow-up Appointments: Return Appointment in 1 week. Bathing/ Shower/ Hygiene: May shower with wound dressing protected with water repellent cover or cast protector. Edema Control - Lymphedema / Segmental Compressive Device / Other: Optional: One layer of unna paste to top of compression wrap (to act as an anchor). Tubigrip single layer applied. - RIGHT LEG tubi grip applied "E" until you get your compression sock use this and it's hand washable Patient to wear own compression stockings. Remove compression stockings every night before going to bed and put on every morning when getting up. - on RIGHT LEG call [facility] [facility] for compression sock 15-22 mm/Hg, measurements and phone number on blue sheet [employer] on [employer] in [place] also has the compression socks Elevate, Exercise Daily and Avoid Standing for Long Periods of Time. Elevate legs to the level of the heart and pump ankles as often as possible Elevate leg(s) parallel to the floor when sitting. Compression Pump: Use compression pump on left lower extremity for 60 minutes, twice daily. - You may pump over wraps Compression Pump: Use compression pump on right lower extremity for 60 minutes, twice daily. - You may pump over wraps Additional Orders / Instructions: Wound #2 Left,Lateral  Lower Leg: Follow Nutritious Diet and Increase Protein Intake - Increase protein intake. WOUND #2: - Lower Leg Wound Laterality: Left, Lateral Cleanser: Soap and Water 1 x Per Week/30 Days Discharge Instructions: Gently cleanse wound with antibacterial soap, rinse and pat dry prior to dressing wounds Primary Dressing: Hydrofera Blue Ready Transfer Foam, 2.5x2.5 (in/in) 1 x Per Week/30 Days Discharge Instructions: Apply Hydrofera Blue Ready to wound bed as directed Secondary Dressing: ABD Pad 5x9 (in/in) 1 x Per Week/30 Days Discharge Instructions: Cover with ABD pad Compression Wrap:  Profore Lite LF 3 Multilayer Compression Bandaging System 1 x Per Week/30 Days Discharge Instructions: Apply 3 multi-layer wrap as prescribed. [name], [name] ([id number]) 1. Would recommend currently that we going continue with the wound care measures as before and the patient is in agreement the plan this includes the use of the compression wrapping we have been doing a 3 layer compression wrap. 2. I am also going to recommend that we start the patient with the [facility] dressing which I think will be a better way to go. Hopefully this will show some signs of improvement over the next week. We will see patient back for reevaluation in 1 week here in the clinic. If anything worsens or changes patient will contact our office for additional recommendations. Electronic Signature(s) Signed: 05/21/2021 1:26:06 PM By: Worthy Keeler PA-C Entered By: Worthy Keeler on 05/21/2021 13:26:06 [name] ([id number]) -------------------------------------------------------------------------------- SuperBill Details Patient Name: [name] Date of Service: 05/21/2021 Medical Record Number: [id number] Patient Account Number: [id number] Date of Birth/Sex: Jun 08, 1946 (75 y.o. F) Treating RN: Carlene Coria Primary Care Provider: [name] Other Clinician: Referring Provider: [name] Treating  Provider/Extender: Skipper Cliche in Treatment: 17 Diagnosis Coding ICD-10 Codes Code Description I89.0 Lymphedema, not elsewhere classified I87.332 Chronic venous hypertension (idiopathic) with ulcer and inflammation of left lower extremity L97.828 Non-pressure chronic ulcer of other part of left lower leg with other specified severity Facility Procedures CPT4 Code: [id number] Description: (Facility Use Only) [id number] - [facility] LWR LT LEG Modifier: Quantity: 1 Physician Procedures CPT4 Code Description: [id number] - WC PHYS LEVEL 3 - EST PT Modifier: Quantity: 1 CPT4 Code Description: ICD-10 Diagnosis Description I89.0 Lymphedema, not elsewhere classified I87.332 Chronic venous hypertension (idiopathic) with ulcer and inflammation of le L97.828 Non-pressure chronic ulcer of other part of left lower leg with  other spec Modifier: ft lower extremity ified severity Quantity: Electronic Signature(s) Signed: 05/21/2021 1:26:17 PM By: Worthy Keeler PA-C Entered By: Worthy Keeler on 05/21/2021 13:26:16

## 2021-05-22 NOTE — Progress Notes (Signed)
[name], [name] ([id number]) Visit Report for 05/14/2021 Arrival Information Details Patient Name: MOSSIE, GILDER Date of Service: 05/14/2021 12:30 PM Medical Record Number: [id number] Patient Account Number: [id number] Date of Birth/Sex: 1946/07/30 (75 y.o. F) Treating RN: Carlene Coria Primary Care Pina Sirianni: [name] Other Clinician: Referring Bobby Barton: [name] Treating Kendall Justo/Extender: Skipper Cliche in Treatment: 16 Visit Information History Since Last Visit All ordered tests and consults were completed: No Patient Arrived: Wheel Chair Added or deleted any medications: No Arrival Time: 12:42 Any new allergies or adverse reactions: No Accompanied By: self Had a fall or experienced change in No Transfer Assistance: None activities of daily living that may affect Patient Identification Verified: Yes risk of falls: Secondary Verification Process Completed: Yes Signs or symptoms of abuse/neglect since last visito No Patient Requires Transmission-Based Precautions: No Hospitalized since last visit: No Patient Has Alerts: Yes Implantable device outside of the clinic excluding No Patient Alerts: NOT DIABETIC cellular tissue based products placed in the center since last visit: Has Dressing in Place as Prescribed: Yes Has Compression in Place as Prescribed: Yes Pain Present Now: No Electronic Signature(s) Signed: 05/22/2021 7:54:52 AM By: Carlene Coria RN Entered By: Carlene Coria on 05/14/2021 12:48:19 [name] ([id number]) -------------------------------------------------------------------------------- Clinic Level of Care Assessment Details Patient Name: [name] Date of Service: 05/14/2021 12:30 PM Medical Record Number: [id number] Patient Account Number: [id number] Date of Birth/Sex: [birth year] (75 y.o. F) Treating RN: Carlene Coria Primary Care Godwin Tedesco: [name] Other Clinician: Referring Permelia Bamba: [name] Treating Zanae Kuehnle/Extender:  Skipper Cliche in Treatment: 16 Clinic Level of Care Assessment Items TOOL 1 Quantity Score _0  - Use when EandM and Procedure is performed on INITIAL visit 0 ASSESSMENTS - Nursing Assessment / Reassessment _1  - General Physical Exam (combine w/ comprehensive assessment (listed just below) when performed on new 0 pt. evals) _2  - 0 Comprehensive Assessment (HX, ROS, Risk Assessments, Wounds Hx, etc.) ASSESSMENTS - Wound and Skin Assessment / Reassessment _3  - Dermatologic / Skin Assessment (not related to wound area) 0 ASSESSMENTS - Ostomy and/or Continence Assessment and Care _4  - Incontinence Assessment and Management 0 _5  - 0 Ostomy Care Assessment and Management (repouching, etc.) PROCESS - Coordination of Care _6  - Simple Patient / Family Education for ongoing care 0 _7  - 0 Complex (extensive) Patient / Family Education for ongoing care _8  - 0 Staff obtains Programmer, systems, Records, Test Results / Process Orders _9  - 0 Staff telephones HHA, Nursing Homes / Clarify orders / etc _10  - 0 Routine Transfer to another Facility (non-emergent condition) _11  - 0 Routine Hospital Admission (non-emergent condition) _12  - 0 New Admissions / Biomedical engineer / Ordering NPWT, Apligraf, etc. _13  - 0 Emergency Hospital Admission (emergent condition) PROCESS - Special Needs _14  - Pediatric / Minor Patient Management 0 _15  - 0 Isolation Patient Management _16  - 0 Hearing / Language / Visual special needs _17  - 0 Assessment of Community assistance (transportation, D/C planning, etc.) _18  - 0 Additional assistance / Altered mentation _19  - 0 Support Surface(s) Assessment (bed, cushion, seat, etc.) INTERVENTIONS - Miscellaneous _20  - External ear exam 0 _21  - 0 Patient Transfer (multiple staff / Civil Service fast streamer / Similar devices) _22  - 0 Simple Staple / Suture removal (25 or less) _23  - 0 Complex Staple / Suture removal (26 or more) _24  - 0 Hypo/Hyperglycemic Management (do not check if billed  separately) _25  - 0 Ankle / Brachial Index (ABI) - do not check if billed separately Has the patient been seen at the hospital within the  last three years: Yes Total Score: 0 Level Of Care: ____ [name] ([id number]) Electronic Signature(s) Signed: 05/22/2021 7:54:52 AM By: Carlene Coria RN Entered By: Carlene Coria on 05/14/2021 13:15:39 [name] ([id number]) -------------------------------------------------------------------------------- Compression Therapy Details Patient Name: [name] Date of Service: 05/14/2021 12:30 PM Medical Record Number: [id number] Patient Account Number: [id number] Date of Birth/Sex: October 22, 1945 (75 y.o. F) Treating RN: Carlene Coria Primary Care Lenn Volker: [name] Other Clinician: Referring Harold Mattes: [name] Treating Jerrald Doverspike/Extender: Skipper Cliche in Treatment: 16 Compression Therapy Performed for Wound Assessment: Wound #2 Left,Lateral Lower Leg Performed By: Clinician Carlene Coria, RN Compression Type: Three Layer Post Procedure Diagnosis Same as Pre-procedure Electronic Signature(s) Signed: 05/22/2021 7:54:52 AM By: Carlene Coria RN Entered By: Carlene Coria on 05/14/2021 13:03:43 [name] ([id number]) -------------------------------------------------------------------------------- Encounter Discharge Information Details Patient Name: [name] Date of Service: 05/14/2021 12:30 PM Medical Record Number: [id number] Patient Account Number: [id number] Date of Birth/Sex: 11-29-1945 (75 y.o. F) Treating RN: Carlene Coria Primary Care Oyinkansola Truax: [name] Other Clinician: Referring Danel Requena: [name] Treating Porchea Charrier/Extender: Skipper Cliche in Treatment: 16 Encounter Discharge Information Items Discharge Condition: Stable Ambulatory Status: Wheelchair Discharge Destination: Home Transportation: Private Auto Accompanied By: self Schedule Follow-up Appointment: Yes Clinical Summary of Care:  Patient Declined Electronic Signature(s) Signed: 05/22/2021 7:54:52 AM By: Carlene Coria RN Entered By: Carlene Coria on 05/14/2021 13:16:41 [name] ([id number]) -------------------------------------------------------------------------------- Lower Extremity Assessment Details Patient Name: [name] Date of Service: 05/14/2021 12:30 PM Medical Record Number: [id number] Patient Account Number: [id number] Date of Birth/Sex: 1946/02/08 (75 y.o. F) Treating RN: Carlene Coria Primary Care Henritta Mutz: [name] Other Clinician: Referring Oluwafemi Villella: [name] Treating Modesta Sammons/Extender: Skipper Cliche in Treatment: 16 Edema Assessment Assessed: [Left: No] [Right: No] Edema: [Left: Ye] [Right: s] Calf Left: Right: Point of Measurement: 27 cm From Medial Instep 39 cm Ankle Left: Right: Point of Measurement: 10 cm From Medial Instep 27 cm Vascular Assessment Pulses: Dorsalis Pedis Palpable: [Left:Yes] Electronic Signature(s) Signed: 05/22/2021 7:54:52 AM By: Carlene Coria RN Entered By: Carlene Coria on 05/14/2021 12:56:54 [name] ([id number]) -------------------------------------------------------------------------------- Multi Wound Chart Details Patient Name: [name] Date of Service: 05/14/2021 12:30 PM Medical Record Number: [id number] Patient Account Number: [id number] Date of Birth/Sex: 04-17-1946 (75 y.o. F) Treating RN: Carlene Coria Primary Care Eleaner Dibartolo: [name] Other Clinician: Referring Cason Dabney: [name] Treating Kitzia Camus/Extender: Skipper Cliche in Treatment: 16 Vital Signs Height(in): 69 Pulse(bpm): 77 Weight(lbs): 244 Blood Pressure(mmHg): 152/75 Body Mass Index(BMI): 36 Temperature(F): 98.4 Respiratory Rate(breaths/min): 18 Photos: [N/A:N/A] Wound Location: Left, Lateral Lower Leg N/A N/A Wounding Event: Gradually Appeared N/A N/A Primary Etiology: Venous Leg Ulcer N/A N/A Comorbid History: Cataracts, Lymphedema,  Sleep N/A N/A Apnea, Congestive Heart Failure, Hypertension, Osteoarthritis, Neuropathy, Received Chemotherapy, Received Radiation Date Acquired: 09/08/2020 N/A N/A Weeks of Treatment: 16 N/A N/A Wound Status: Open N/A N/A Measurements L x W x D (cm) 2.2x1x0.1 N/A N/A Area (cm) : 1.728 N/A N/A Volume (cm) : 0.173 N/A N/A % Reduction in Area: 63.30% N/A N/A % Reduction in Volume: 63.30% N/A N/A Classification: Full Thickness Without Exposed N/A N/A Support Structures Exudate Amount: Medium N/A N/A Exudate Type: Serosanguineous N/A N/A Exudate Color: red, brown N/A N/A Granulation Amount: Medium (34-66%) N/A N/A Granulation Quality: Red N/A N/A Necrotic Amount: Medium (34-66%) N/A N/A Exposed Structures: Fat Layer (Subcutaneous Tissue): N/A N/A Yes Fascia: No Tendon: No Muscle: No Joint: No Bone: No Epithelialization: Medium (34-66%) N/A N/A Treatment Notes Electronic Signature(s) Signed: 05/22/2021 7:54:52 AM By: Carlene Coria RN Entered  ByCarlene Coria on 05/14/2021 13:03:07 [name] ([id number]) -------------------------------------------------------------------------------- Multi-Disciplinary Care Plan Details Patient Name: [name], [name] Date of Service: 05/14/2021 12:30 PM Medical Record Number: [id number] Patient Account Number: [id number] Date of Birth/Sex: Jan 06, 1946 (75 y.o. F) Treating RN: Carlene Coria Primary Care Tidus Upchurch: [name] Other Clinician: Referring Emersyn Wyss: [name] Treating Deyani Hegarty/Extender: Skipper Cliche in Treatment: 16 Active Inactive Wound/Skin Impairment Nursing Diagnoses: Impaired tissue integrity Goals: Patient/caregiver will verbalize understanding of skin care regimen Date Initiated: 01/16/2021 Date Inactivated: 02/18/2021 Target Resolution Date: 02/16/2021 Goal Status: Met Ulcer/skin breakdown will have a volume reduction of 30% by week 4 Date Initiated: 01/16/2021 Date Inactivated: 02/18/2021 Target Resolution  Date: 02/16/2021 Goal Status: Met Ulcer/skin breakdown will have a volume reduction of 50% by week 8 Date Initiated: 01/16/2021 Date Inactivated: 04/01/2021 Target Resolution Date: 03/18/2021 Goal Status: Unmet Unmet Reason: comorbities Ulcer/skin breakdown will have a volume reduction of 80% by week 12 Date Initiated: 04/01/2021 Date Inactivated: 04/23/2021 Target Resolution Date: 04/18/2021 Goal Status: Unmet Unmet Reason: comorbities Ulcer/skin breakdown will heal within 14 weeks Date Initiated: 04/01/2021 Target Resolution Date: 05/19/2021 Goal Status: Active Interventions: Assess ulceration(s) every visit Treatment Activities: Topical wound management initiated : 01/16/2021 Notes: Electronic Signature(s) Signed: 05/22/2021 7:54:52 AM By: Carlene Coria RN Entered By: Carlene Coria on 05/14/2021 13:02:50 [name] ([id number]) -------------------------------------------------------------------------------- Pain Assessment Details Patient Name: [name] Date of Service: 05/14/2021 12:30 PM Medical Record Number: [id number] Patient Account Number: [id number] Date of Birth/Sex: [birth year] (75 y.o. F) Treating RN: Carlene Coria Primary Care Jule Whitsel: [name] Other Clinician: Referring Camara Renstrom: [name] Treating Soren Pigman/Extender: Skipper Cliche in Treatment: 16 Active Problems Location of Pain Severity and Description of Pain Patient Has Paino No Site Locations Pain Management and Medication Current Pain Management: Electronic Signature(s) Signed: 05/22/2021 7:54:52 AM By: Carlene Coria RN Entered By: Carlene Coria on 05/14/2021 12:48:48 [name] ([id number]) -------------------------------------------------------------------------------- Patient/Caregiver Education Details Patient Name: [name] Date of Service: 05/14/2021 12:30 PM Medical Record Number: [id number] Patient Account Number: [id number] Date of Birth/Gender: 13-Feb-1946 (75 y.o.  F) Treating RN: Carlene Coria Primary Care Physician: [name] Other Clinician: Referring Physician: [name] Treating Physician/Extender: Skipper Cliche in Treatment: 16 Education Assessment Education Provided To: Patient Education Topics Provided Wound/Skin Impairment: Methods: Explain/Verbal Responses: State content correctly Electronic Signature(s) Signed: 05/22/2021 7:54:52 AM By: Carlene Coria RN Entered By: Carlene Coria on 05/14/2021 13:15:58 [name] ([id number]) -------------------------------------------------------------------------------- Wound Assessment Details Patient Name: [name] Date of Service: 05/14/2021 12:30 PM Medical Record Number: [id number] Patient Account Number: [id number] Date of Birth/Sex: Oct 13, 1945 (75 y.o. F) Treating RN: Carlene Coria Primary Care Storie Heffern: [name] Other Clinician: Referring Yee Gangi: [name] Treating Sinaya Minogue/Extender: Skipper Cliche in Treatment: 16 Wound Status Wound Number: 2 Primary Venous Leg Ulcer Etiology: Wound Location: Left, Lateral Lower Leg Wound Open Wounding Event: Gradually Appeared Status: Date Acquired: 09/08/2020 Comorbid Cataracts, Lymphedema, Sleep Apnea, Congestive Heart Weeks Of Treatment: 16 History: Failure, Hypertension, Osteoarthritis, Neuropathy, Clustered Wound: No Received Chemotherapy, Received Radiation Photos Wound Measurements Length: (cm) 2.2 Width: (cm) 1 Depth: (cm) 0.1 Area: (cm) 1.728 Volume: (cm) 0.173 % Reduction in Area: 63.3% % Reduction in Volume: 63.3% Epithelialization: Medium (34-66%) Tunneling: No Undermining: No Wound Description Classification: Full Thickness Without Exposed Support Structu Exudate Amount: Medium Exudate Type: Serosanguineous Exudate Color: red, brown res Foul Odor After Cleansing: No Slough/Fibrino Yes Wound Bed Granulation Amount: Medium (34-66%) Exposed Structure Granulation Quality: Red Fascia  Exposed: No Necrotic Amount: Medium (34-66%) Fat Layer (Subcutaneous Tissue) Exposed: Yes Necrotic  Quality: Adherent Slough Tendon Exposed: No Muscle Exposed: No Joint Exposed: No Bone Exposed: No Electronic Signature(s) Signed: 05/22/2021 7:54:52 AM By: Carlene Coria RN Entered By: Carlene Coria on 05/14/2021 12:56:10 [name] ([id number]) -------------------------------------------------------------------------------- [place] Details Patient Name: [name] Date of Service: 05/14/2021 12:30 PM Medical Record Number: [id number] Patient Account Number: [id number] Date of Birth/Sex: 1946-06-02 (75 y.o. F) Treating RN: Carlene Coria Primary Care Joevanni Roddey: [name] Other Clinician: Referring Saryiah Bencosme: [name] Treating Serenitee Fuertes/Extender: Skipper Cliche in Treatment: 16 Vital Signs Time Taken: 12:48 Temperature (F): 98.4 Height (in): 69 Pulse (bpm): 61 Weight (lbs): 244 Respiratory Rate (breaths/min): 18 Body Mass Index (BMI): 36 Blood Pressure (mmHg): 152/75 Reference Range: 80 - 120 mg / dl Electronic Signature(s) Signed: 05/22/2021 7:54:52 AM By: Carlene Coria RN Entered By: Carlene Coria on 05/14/2021 12:48:37

## 2021-05-22 NOTE — Progress Notes (Signed)
[name], [name] ([id number]) Visit Report for 05/21/2021 Arrival Information Details Patient Name: [name] Date of Service: 05/21/2021 12:30 PM Medical Record Number: [id number] Patient Account Number: [id number] Date of Birth/Sex: Aug 24, 1946 (75 y.o. F) Treating RN: Carlene Coria Primary Care Kamryn Gauthier: [name] Other Clinician: Referring Regina Ganci: [name] Treating Jakayden Cancio/Extender: Skipper Cliche in Treatment: 72 Visit Information History Since Last Visit All ordered tests and consults were completed: No Patient Arrived: Wheel Chair Added or deleted any medications: No Arrival Time: 12:39 Any new allergies or adverse reactions: No Accompanied By: self Had a fall or experienced change in No Transfer Assistance: None activities of daily living that may affect Patient Identification Verified: Yes risk of falls: Secondary Verification Process Completed: Yes Signs or symptoms of abuse/neglect since last visito No Patient Requires Transmission-Based Precautions: No Hospitalized since last visit: No Patient Has Alerts: Yes Implantable device outside of the clinic excluding No Patient Alerts: NOT DIABETIC cellular tissue based products placed in the center since last visit: Has Dressing in Place as Prescribed: Yes Has Compression in Place as Prescribed: Yes Pain Present Now: No Electronic Signature(s) Signed: 05/22/2021 7:54:12 AM By: Carlene Coria RN Entered By: Carlene Coria on 05/21/2021 12:46:17 [name] ([id number]) -------------------------------------------------------------------------------- Clinic Level of Care Assessment Details Patient Name: [name] Date of Service: 05/21/2021 12:30 PM Medical Record Number: [id number] Patient Account Number: [id number] Date of Birth/Sex: April 16, 1946 (75 y.o. F) Treating RN: Carlene Coria Primary Care Aleksey Newbern: [name] Other Clinician: Referring Talitha Dicarlo: [name] Treating Terel Bann/Extender:  Skipper Cliche in Treatment: 17 Clinic Level of Care Assessment Items TOOL 1 Quantity Score []  - Use when EandM and Procedure is performed on INITIAL visit 0 ASSESSMENTS - Nursing Assessment / Reassessment []  - General Physical Exam (combine w/ comprehensive assessment (listed just below) when performed on new 0 pt. evals) []  - 0 Comprehensive Assessment (HX, ROS, Risk Assessments, Wounds Hx, etc.) ASSESSMENTS - Wound and Skin Assessment / Reassessment []  - Dermatologic / Skin Assessment (not related to wound area) 0 ASSESSMENTS - Ostomy and/or Continence Assessment and Care []  - Incontinence Assessment and Management 0 []  - 0 Ostomy Care Assessment and Management (repouching, etc.) PROCESS - Coordination of Care []  - Simple Patient / Family Education for ongoing care 0 []  - 0 Complex (extensive) Patient / Family Education for ongoing care []  - 0 Staff obtains Programmer, systems, Records, Test Results / Process Orders []  - 0 Staff telephones HHA, Nursing Homes / Clarify orders / etc []  - 0 Routine Transfer to another Facility (non-emergent condition) []  - 0 Routine Hospital Admission (non-emergent condition) []  - 0 New Admissions / Biomedical engineer / Ordering NPWT, Apligraf, etc. []  - 0 Emergency Hospital Admission (emergent condition) PROCESS - Special Needs []  - Pediatric / Minor Patient Management 0 []  - 0 Isolation Patient Management []  - 0 Hearing / Language / Visual special needs []  - 0 Assessment of Community assistance (transportation, D/C planning, etc.) []  - 0 Additional assistance / Altered mentation []  - 0 Support Surface(s) Assessment (bed, cushion, seat, etc.) INTERVENTIONS - Miscellaneous []  - External ear exam 0 []  - 0 Patient Transfer (multiple staff / Civil Service fast streamer / Similar devices) []  - 0 Simple Staple / Suture removal (25 or less) []  - 0 Complex Staple / Suture removal (26 or more) []  - 0 Hypo/Hyperglycemic Management (do not check if billed  separately) []  - 0 Ankle / Brachial Index (ABI) - do not check if billed separately Has the patient been seen at the hospital within the  last three years: Yes Total Score: 0 Level Of Care: ____ [name] ([id number]) Electronic Signature(s) Signed: 05/22/2021 7:54:12 AM By: Carlene Coria RN Entered By: Carlene Coria on 05/21/2021 13:20:03 [name] ([id number]) -------------------------------------------------------------------------------- Compression Therapy Details Patient Name: [name] Date of Service: 05/21/2021 12:30 PM Medical Record Number: [id number] Patient Account Number: [id number] Date of Birth/Sex: 1946-01-03 (75 y.o. F) Treating RN: Carlene Coria Primary Care Lenward Able: [name] Other Clinician: Referring Kace Hartje: [name] Treating Evanell Redlich/Extender: Skipper Cliche in Treatment: 17 Compression Therapy Performed for Wound Assessment: Wound #2 Left,Lateral Lower Leg Performed By: Clinician Carlene Coria, RN Compression Type: Three Layer Post Procedure Diagnosis Same as Pre-procedure Electronic Signature(s) Signed: 05/22/2021 7:54:12 AM By: Carlene Coria RN Entered By: Carlene Coria on 05/21/2021 13:03:57 [name] ([id number]) -------------------------------------------------------------------------------- Lower Extremity Assessment Details Patient Name: [name] Date of Service: 05/21/2021 12:30 PM Medical Record Number: [id number] Patient Account Number: [id number] Date of Birth/Sex: 06/20/1946 (75 y.o. F) Treating RN: Carlene Coria Primary Care Morghan Kester: [name] Other Clinician: Referring Rosmarie Esquibel: [name] Treating Selita Staiger/Extender: Skipper Cliche in Treatment: 17 Edema Assessment Assessed: [Left: No] [Right: No] Edema: [Left: Ye] [Right: s] Calf Left: Right: Point of Measurement: 27 cm From Medial Instep 41 cm Ankle Left: Right: Point of Measurement: 10 cm From Medial Instep 25 cm Vascular  Assessment Pulses: Dorsalis Pedis Palpable: [Left:Yes] Electronic Signature(s) Signed: 05/22/2021 7:54:12 AM By: Carlene Coria RN Entered By: Carlene Coria on 05/21/2021 12:54:20 [name] ([id number]) -------------------------------------------------------------------------------- Multi Wound Chart Details Patient Name: [name] Date of Service: 05/21/2021 12:30 PM Medical Record Number: [id number] Patient Account Number: [id number] Date of Birth/Sex: 11-14-1945 (75 y.o. F) Treating RN: Carlene Coria Primary Care Margareth Kanner: [name] Other Clinician: Referring Shenequa Howse: [name] Treating Kavin Weckwerth/Extender: Skipper Cliche in Treatment: 17 Vital Signs Height(in): 69 Pulse(bpm): 54 Weight(lbs): 244 Blood Pressure(mmHg): 148/67 Body Mass Index(BMI): 36 Temperature(F): 98.3 Respiratory Rate(breaths/min): 18 Photos: [N/A:N/A] Wound Location: Left, Lateral Lower Leg N/A N/A Wounding Event: Gradually Appeared N/A N/A Primary Etiology: Venous Leg Ulcer N/A N/A Comorbid History: Cataracts, Lymphedema, Sleep N/A N/A Apnea, Congestive Heart Failure, Hypertension, Osteoarthritis, Neuropathy, Received Chemotherapy, Received Radiation Date Acquired: 09/08/2020 N/A N/A Weeks of Treatment: 17 N/A N/A Wound Status: Open N/A N/A Measurements L x W x D (cm) 2.5x0.8x0.1 N/A N/A Area (cm) : 1.571 N/A N/A Volume (cm) : 0.157 N/A N/A % Reduction in Area: 66.70% N/A N/A % Reduction in Volume: 66.70% N/A N/A Classification: Full Thickness Without Exposed N/A N/A Support Structures Exudate Amount: Medium N/A N/A Exudate Type: Serosanguineous N/A N/A Exudate Color: red, brown N/A N/A Granulation Amount: Medium (34-66%) N/A N/A Granulation Quality: Red N/A N/A Necrotic Amount: Medium (34-66%) N/A N/A Exposed Structures: Fat Layer (Subcutaneous Tissue): N/A N/A Yes Fascia: No Tendon: No Muscle: No Joint: No Bone: No Epithelialization: Medium (34-66%) N/A  N/A Treatment Notes Electronic Signature(s) Signed: 05/22/2021 7:54:12 AM By: Carlene Coria RN Entered By: Carlene Coria on 05/21/2021 13:00:54 [name] ([id number]) -------------------------------------------------------------------------------- Multi-Disciplinary Care Plan Details Patient Name: [name] Date of Service: 05/21/2021 12:30 PM Medical Record Number: [id number] Patient Account Number: [id number] Date of Birth/Sex: December 24, 1945 (75 y.o. F) Treating RN: Carlene Coria Primary Care Nikko Goldwire: [name] Other Clinician: Referring Julita Ozbun: [name] Treating Robyne Matar/Extender: Skipper Cliche in Treatment: 17 Active Inactive Wound/Skin Impairment Nursing Diagnoses: Impaired tissue integrity Goals: Patient/caregiver will verbalize understanding of skin care regimen Date Initiated: 01/16/2021 Date Inactivated: 02/18/2021 Target Resolution Date: 02/16/2021 Goal Status: Met Ulcer/skin breakdown will have a volume reduction of 30%  by week 4 Date Initiated: 01/16/2021 Date Inactivated: 02/18/2021 Target Resolution Date: 02/16/2021 Goal Status: Met Ulcer/skin breakdown will have a volume reduction of 50% by week 8 Date Initiated: 01/16/2021 Date Inactivated: 04/01/2021 Target Resolution Date: 03/18/2021 Goal Status: Unmet Unmet Reason: comorbities Ulcer/skin breakdown will have a volume reduction of 80% by week 12 Date Initiated: 04/01/2021 Date Inactivated: 04/23/2021 Target Resolution Date: 04/18/2021 Goal Status: Unmet Unmet Reason: comorbities Ulcer/skin breakdown will heal within 14 weeks Date Initiated: 04/01/2021 Target Resolution Date: 05/19/2021 Goal Status: Active Interventions: Assess ulceration(s) every visit Treatment Activities: Topical wound management initiated : 01/16/2021 Notes: Electronic Signature(s) Signed: 05/22/2021 7:54:12 AM By: Carlene Coria RN Entered By: Carlene Coria on 05/21/2021 13:00:31 [name]  ([id number]) -------------------------------------------------------------------------------- Pain Assessment Details Patient Name: [name] Date of Service: 05/21/2021 12:30 PM Medical Record Number: [id number] Patient Account Number: [id number] Date of Birth/Sex: [birth year] (75 y.o. F) Treating RN: Carlene Coria Primary Care Zoee Heeney: [name] Other Clinician: Referring Ariea Rochin: [name] Treating Trishna Cwik/Extender: Skipper Cliche in Treatment: 17 Active Problems Location of Pain Severity and Description of Pain Patient Has Paino No Site Locations Pain Management and Medication Current Pain Management: Electronic Signature(s) Signed: 05/22/2021 7:54:12 AM By: Carlene Coria RN Entered By: Carlene Coria on 05/21/2021 12:46:40 [name] ([id number]) -------------------------------------------------------------------------------- Patient/Caregiver Education Details Patient Name: [name] Date of Service: 05/21/2021 12:30 PM Medical Record Number: [id number] Patient Account Number: [id number] Date of Birth/Gender: [birth year] (75 y.o. F) Treating RN: Carlene Coria Primary Care Physician: [name] Other Clinician: Referring Physician: [name] Treating Physician/Extender: Skipper Cliche in Treatment: 17 Education Assessment Education Provided To: Patient Education Topics Provided Wound/Skin Impairment: Responses: State content correctly Electronic Signature(s) Signed: 05/22/2021 7:54:12 AM By: Carlene Coria RN Entered By: Carlene Coria on 05/21/2021 13:20:38 [name] ([id number]) -------------------------------------------------------------------------------- Wound Assessment Details Patient Name: [name] Date of Service: 05/21/2021 12:30 PM Medical Record Number: [id number] Patient Account Number: [id number] Date of Birth/Sex: 03/10/1946 (75 y.o. F) Treating RN: Carlene Coria Primary Care Merrell Borsuk: [name] Other  Clinician: Referring Maelie Chriswell: [name] Treating Letishia Elliott/Extender: Skipper Cliche in Treatment: 17 Wound Status Wound Number: 2 Primary Venous Leg Ulcer Etiology: Wound Location: Left, Lateral Lower Leg Wound Open Wounding Event: Gradually Appeared Status: Date Acquired: 09/08/2020 Comorbid Cataracts, Lymphedema, Sleep Apnea, Congestive Heart Weeks Of Treatment: 17 History: Failure, Hypertension, Osteoarthritis, Neuropathy, Clustered Wound: No Received Chemotherapy, Received Radiation Photos Wound Measurements Length: (cm) 2.5 Width: (cm) 0.8 Depth: (cm) 0.1 Area: (cm) 1.571 Volume: (cm) 0.157 % Reduction in Area: 66.7% % Reduction in Volume: 66.7% Epithelialization: Medium (34-66%) Tunneling: No Undermining: No Wound Description Classification: Full Thickness Without Exposed Support Structu Exudate Amount: Medium Exudate Type: Serosanguineous Exudate Color: red, brown res Foul Odor After Cleansing: No Slough/Fibrino Yes Wound Bed Granulation Amount: Medium (34-66%) Exposed Structure Granulation Quality: Red Fascia Exposed: No Necrotic Amount: Medium (34-66%) Fat Layer (Subcutaneous Tissue) Exposed: Yes Necrotic Quality: Adherent Slough Tendon Exposed: No Muscle Exposed: No Joint Exposed: No Bone Exposed: No Electronic Signature(s) Signed: 05/22/2021 7:54:12 AM By: Carlene Coria RN Entered By: Carlene Coria on 05/21/2021 12:53:15 [name] ([id number]) -------------------------------------------------------------------------------- Vitals Details Patient Name: [name] Date of Service: 05/21/2021 12:30 PM Medical Record Number: [id number] Patient Account Number: [id number] Date of Birth/Sex: 1945/12/24 (75 y.o. F) Treating RN: Carlene Coria Primary Care Pryce Folts: [name] Other Clinician: Referring Erasto Sleight: [name] Treating Demetric Dunnaway/Extender: Skipper Cliche in Treatment: 17 Vital Signs Time Taken: 12:46 Temperature (F):  98.3 Height (in): 69 Pulse (bpm): 54 Weight (lbs): 244 Respiratory  Rate (breaths/min): 18 Body Mass Index (BMI): 36 Blood Pressure (mmHg): 148/67 Reference Range: 80 - 120 mg / dl Electronic Signature(s) Signed: 05/22/2021 7:54:12 AM By: Carlene Coria RN Entered By: Carlene Coria on 05/21/2021 12:46:33

## 2021-05-24 ENCOUNTER — Other Ambulatory Visit: Payer: Self-pay

## 2021-05-24 ENCOUNTER — Ambulatory Visit
Admission: RE | Admit: 2021-05-24 | Discharge: 2021-05-24 | Disposition: A | Discharge status: Home / Self Care | Payer: Medicare Other | Source: Ambulatory Visit | Attending: Oncology | Admitting: Oncology

## 2021-05-24 DIAGNOSIS — Z1231 Encounter for screening mammogram for malignant neoplasm of breast: Secondary | ICD-10-CM | POA: Diagnosis not present

## 2021-05-28 ENCOUNTER — Encounter: Payer: Medicare Other | Admitting: Physician Assistant

## 2021-05-28 ENCOUNTER — Other Ambulatory Visit: Payer: Self-pay

## 2021-05-28 DIAGNOSIS — I87332 Chronic venous hypertension (idiopathic) with ulcer and inflammation of left lower extremity: Secondary | ICD-10-CM | POA: Diagnosis not present

## 2021-05-28 NOTE — Progress Notes (Addendum)
[name], [name] ([id number]) Visit Report for 05/28/2021 Chief Complaint Document Details Patient Name: [name], [name] Date of Service: 05/28/2021 1:15 PM Medical Record Number: [id number] Patient Account Number: [id number] Date of Birth/Sex: [birth year] (75 y.o. F) Treating RN: Carlene Coria Primary Care Provider: [name] Other Clinician: Referring Provider: [name] Treating Provider/Extender: Skipper Cliche in Treatment: 18 Information Obtained from: Patient Chief Complaint Left LE Ulcer Electronic Signature(s) Signed: 05/28/2021 1:35:52 PM By: Worthy Keeler PA-C Entered By: Worthy Keeler on 05/28/2021 13:35:52 [name] ([id number]) -------------------------------------------------------------------------------- HPI Details Patient Name: [name] Date of Service: 05/28/2021 1:15 PM Medical Record Number: [id number] Patient Account Number: [id number] Date of Birth/Sex: 23-Mar-1946 (75 y.o. F) Treating RN: Carlene Coria Primary Care Provider: [name] Other Clinician: Referring Provider: [name] Treating Provider/Extender: Skipper Cliche in Treatment: 18 History of Present Illness HPI Description: 03/15/2020 upon evaluation today patient presents today for initial evaluation here in our clinic concerning a wound that is on the left posterior lower extremity. Unfortunately this has been giving the patient some discomfort at this point which she notes has been affected her pretty much daily. The patient does have a history of venous insufficiency, hypertension, congestive heart failure, and a history of breast cancer. Fortunately there does not appear to be evidence of active infection at this time which is great news. No fevers, chills, nausea, vomiting, or diarrhea. Wound is not extremely large but does have some slough covering the surface of the wound. This is going require sharp debridement today. 03/15/2020 upon evaluation today patient  appears to be doing fairly well in regard to her wound. She does have some slough noted on the surface of the wound currently but I do believe the Iodoflex has been beneficial over the past week. There is no signs of active infection at this time which is great news and overall very pleased with the progress. No fevers, chills, nausea, vomiting, or diarrhea. 04/02/2020 upon evaluation today patient appears to be doing a little better in regard to her wound size wise this is not tremendously smaller she does have some slough buildup on the surface of the wound. With that being said this is going require some sharp debridement today to clear away some of the necrotic debris. Fortunately there is no evidence of active infection at this time. No fevers, chills, nausea, vomiting, or diarrhea. 04/10/2020 on evaluation today patient appears to be doing well with regard to her ulcer which is measuring somewhat smaller today. She actually has more granulation tissue noted which is also good news is no need for sharp debridement today. Fortunately there is no evidence of infection either which is also excellent. No fevers, chills, nausea, vomiting, or diarrhea. 04/26/2020 on evaluation today patient's wound actually is showing signs of good improvement which is great news. There does not appear to be any evidence of active infection. I do believe she is tolerating the collagen at this point. 05/03/2020 upon evaluation today patient's wound actually showed signs of good granulation at this time there does not appear to be any evidence of active infection which is great news and overall very pleased with where things stand. With that being said I do believe that the patient is can require some sharp debridement today but fortunately nothing too significant. 05/11/20 on evaluation today patient appears to be doing well in regard to her leg ulcer. She's been tolerating the dressing changes without complication.  Fortunately there is no signs of active infection at this time.  Overall I'm very pleased with where things stand. 05/18/2020 upon evaluation today patient's wound is showing signs of improvement is very dry and I think the collagen for the most part just dried to the wound bed. I am going to clean this away with sharp debridement in order to get this under control in my opinion. 05/25/2020 upon evaluation today patient actually appears to be doing well in regard to her leg ulcer. In fact upon inspection it appears she could potentially be completely healed but that is not guaranteed based on what we are seeing. There does not appear to be any signs of active infection which is great news. 05/31/2020 on evaluation today patient appears to be doing well with regard to her wounds. She in fact appears to be almost completely healed on the left leg there is just a very small opening remaining point 1 06/08/2020 upon evaluation today patient appears to be doing excellent in regard to her leg ulcer on the left in fact it appears to be that she is completely healed as of today. I see no signs of active infection at this time which is great news. No fevers, chills, nausea, vomiting, or diarrhea. READMISSION 01/16/2021 Patient that we have had in this clinic with a wound on the left posterior calf in the summer 2021 extending into October. She was felt to have chronic venous insufficiency. Per the patient's description she was discharged with what sounds like external compression stockings although she could not afford the "$75 per leg". She therefore did not get anything. Apparently her wound that she has currently is been in existence since January. She has been followed at vein and vascular with Unna boots and I think calcium alginate. She had ABIs done on 06/25/2020 that showed an noncompressible ABI on the right leg and the left leg but triphasic waveforms with good great toe pressures and a TBI of 0.90 on the  right and 0.91 on the left Surprisingly the patient also had venous reflux studies that were really unremarkable. This included no evidence of DVTs or SVTs but there was no evidence of deep venous insufficiency or superficial venous insufficiency in the greater saphenous or short saphenous veins bilaterally. I would wonder about more central venous issues or perhaps this is all lymphedema 01/25/2021 upon evaluation today patient appears to be doing decently well and guard to her wound. The good news is the Iodoflex that is job she saw Dr. Dellia Nims last week for readmission and to be honest I think that this has done extremely well over that week. I do believe that she can tolerate the sharp debridement today which will be good. I think that cleaning off the wound will likely allow [place] to be able to use a different type of dressing I do not think the Iodoflex will even be necessary based on how clean the wound looks. Fortunately there is no sign of active infection at this time which is great news. No fevers, chills, nausea, vomiting, or diarrhea. 02/12/2021 upon evaluation today patient appears to be doing well with regard to her leg ulcer. We did switch to alginate when she came for nurse visit I think is doing much better for her. Fortunately there does not appear to be any signs of active infection at this time which is great news. No fevers, chills, nausea, vomiting, or diarrhea. CENA, BRUHN ([id number]) 02/18/2021 upon evaluation today patient's wound is actually showing signs of good improvement. I am very pleased with where things stand  currently. I do not see any signs of active infection which is great news and overall I feel like she is making good progress. The patient likewise is happy that things are doing so well and that this is measuring somewhat smaller. 02/25/2021 upon evaluation today patient actually appears to be doing quite well in regard to her wounds. She has been tolerating the  dressing changes without complication. Fortunately there does not appear to be any signs of active infection which is great news and overall I am extremely pleased with where things stand. No fevers, chills, nausea, vomiting, or diarrhea. She does have a lot of edema I really do think she needs compression socks to be worn in order to prevent things from causing additional issues for her currently. Especially on the right leg she should be wearing compression socks all the time to be honest. 03/04/2021 upon evaluation today patient's leg ulcer actually appears to be doing pretty well which is great news. There does not appear to be any significant change but overall the appearance is better even though the size is not necessarily reflecting a great improvement. Fortunately there does not appear to be any signs of active infection. No fevers, chills, nausea, vomiting, or diarrhea. 03/12/2021 upon evaluation today patient appears to be doing well with regard to her wounds. She has been tolerating the dressing changes without complication. The good news is that the wound on her left lateral leg is doing much better and is showing signs of good improvement. Overall her swelling is controlled well as well. She does need some compression socks she plans to go Jefferey socks next week. 03/18/2021 on evaluation today patient's wound actually is showing signs of good improvement. She does have a little bit of slough this can require some sharp debridement today. 03/25/2021 upon evaluation today patient appears to be doing well with regard to her wound on the left lateral leg. She has been tolerating the dressing changes without complication. Fortunately there does not appear to be any signs of active infection at this time. No fever chills no 04/01/2021 upon evaluation today patient's wound is showing signs of improvement and overall very pleased with where things stand at this point. There is no evidence of active  infection which is great news as well and in general I think that she is making great progress. 04/09/2021 upon evaluation today patient appears to be doing well with regard to her ankle ulcer. She is making good progress currently which is great news. There does not appear to be any signs of infection also excellent news. In general I am extremely pleased with where things stand today. No fevers, chills, nausea, vomiting, or diarrhea. 04/23/2021 upon inspection today patient appears to be doing quite well with regard to her leg ulcer. She has been tolerating the dressing changes without complication. She is can require some sharp debridement today but overall seems to be doing excellent. 05/06/2021 upon evaluation today patient unfortunately appears to be doing poorly overall in regard to her swelling. She is actually significantly more swollen than last week despite the fact that her wound is doing better this is not a good trend. I think that she probably needs to see her cardiologist ASAP and I discussed that with her today. This includes the fact that honestly I think she probably is getting need an increase in her diuretic or something to try to clear away some of the excess fluid that she is experiencing at the moment. She is  not been doing her pumps even twice a day but again right now more concerned that if she were to do the pump she would fluid overload her lungs causing other issues as far as her breathing is concerned. Obviously I think she needs to contact them and move up the appointment for the 12th of something sooner 2 weeks is a bit too long to wait with how swollen she has. 05/14/2021 upon evaluation today patient's wound actually showing signs of being a little bit smaller compared to previous. She has been making good progress however. Fortunately there does not appear to be any evidence of active infection which is great. Overall I am extremely pleased in that regard. Nonetheless I am  still concerned that she is having quite a bit of issue as far as the swelling is concerned she has been placed on fluid restriction as well as an increase in the furosemide by her doctor. We will see how things progress. 05/21/2021 upon evaluation today patient appears to be doing well with regard to her wound in general. She has been tolerating the dressing changes without complication. With that being said she has been using silver cell for a while and while this was showing signs of improvement it is now gotten to the point where this has slowed down quite significantly. I do believe that switching to a different dressing may be beneficial for her today. 05/28/2021 upon evaluation today patient actually appears to be doing quite well in regard to her wound. In fact there is really not any need for sharp debridement today based on what I see. The Hydrofera Blue is doing great and overall I think that she is managing quite nicely with the compression wrapping. Her edema is still down quite a bit with the wrapping in place. Electronic Signature(s) Signed: 05/28/2021 1:49:01 PM By: Worthy Keeler PA-C Entered By: Worthy Keeler on 05/28/2021 13:49:00 DARYLE, BOYINGTON ([id number]) -------------------------------------------------------------------------------- Physical Exam Details Patient Name: [name] Date of Service: 05/28/2021 1:15 PM Medical Record Number: [id number] Patient Account Number: [id number] Date of Birth/Sex: [birth year] (75 y.o. F) Treating RN: Carlene Coria Primary Care Provider: [name] Other Clinician: Referring Provider: [name] Treating Provider/Extender: Skipper Cliche in Treatment: 63 Constitutional Well-nourished and well-hydrated in no acute distress. Respiratory normal breathing without difficulty. Psychiatric this patient is able to make decisions and demonstrates good insight into disease process. Alert and Oriented x 3. pleasant and  cooperative. Notes Upon inspection patient's wound bed showed signs of good granulation and epithelization at this point. There was really no significant slough buildup noted over the surface of the wound there is a lot of good granulation and overall I feel like that the patient is doing quite well. Electronic Signature(s) Signed: 05/28/2021 1:49:15 PM By: Worthy Keeler PA-C Entered By: Worthy Keeler on 05/28/2021 13:49:15 [name] ([id number]) -------------------------------------------------------------------------------- Physician Orders Details Patient Name: [name] Date of Service: 05/28/2021 1:15 PM Medical Record Number: [id number] Patient Account Number: [id number] Date of Birth/Sex: [birth year] (75 y.o. 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 Bathing/ Shower/ Hygiene o May  shower with wound dressing protected with water repellent cover or cast protector. Edema Control - Lymphedema / Segmental Compressive Device / Other Left Lower Extremity o Optional: One layer of unna paste to top of compression wrap (to act as an anchor). o Tubigrip single layer applied. - RIGHT LEG tubi grip applied "E" until you get your compression sock use this and it's hand washable o Patient to wear own compression stockings. Remove compression stockings every night before going to bed and put on every morning when getting up. - on RIGHT LEG call [facility] for  compression sock 15-22 mm/Hg, measurements and phone number on blue sheet [employer] on [employer] in [place] also has the compression socks o Elevate, Exercise Daily and Avoid Standing for Long Periods of Time. o Elevate legs to the level of the heart and pump ankles as often as possible o Elevate leg(s) parallel to the floor when sitting. o Compression Pump: Use compression pump on left lower extremity for 60 minutes, twice daily. - You may pump over wraps o Compression Pump: Use compression pump on right lower extremity for 60 minutes, twice daily. - You may pump over wraps Additional Orders / Instructions Wound #2 Left,Lateral Lower Leg o Follow Nutritious Diet and Increase Protein Intake - Increase protein intake. Wound Treatment Wound #2 - Lower Leg Wound Laterality: Left, Lateral Cleanser: Soap and Water 1 x Per Week/30 Days Discharge Instructions: Gently cleanse wound with antibacterial soap, rinse and pat dry prior to dressing wounds Primary Dressing: Hydrofera Blue Ready Transfer Foam, 2.5x2.5 (in/in) 1 x Per Week/30 Days Discharge Instructions: Apply Hydrofera Blue Ready to wound bed as directed Secondary Dressing: ABD Pad 5x9 (in/in) 1 x Per Week/30 Days Discharge Instructions: Cover with ABD pad Compression Wrap: Profore Lite LF 3 Multilayer Compression Bandaging System 1 x Per Week/30 Days Discharge Instructions: Apply 3 multi-layer wrap as prescribed. Electronic Signature(s) Signed: 05/28/2021 6:22:10 PM By: Worthy Keeler PA-C Signed: 05/30/2021 1:17:18 PM By: Carlene Coria RN Entered By: Carlene Coria on 05/28/2021 13:41:28 [name] ([id number]) -------------------------------------------------------------------------------- Problem List Details Patient Name: [name] Date of Service: 05/28/2021 1:15 PM Medical Record Number: [id number] Patient Account Number: [id number] Date of Birth/Sex: 1945/09/25 (75 y.o. F) Treating RN: Carlene Coria Primary Care Provider: [name] Other Clinician: Referring Provider: [name] Treating Provider/Extender: Skipper Cliche in Treatment: 18 Active Problems ICD-10 Encounter Code Description Active Date MDM Diagnosis I89.0 Lymphedema, not elsewhere classified 01/16/2021 No Yes I87.332 Chronic venous hypertension (idiopathic) with ulcer and inflammation of 01/16/2021 No Yes left lower extremity L97.828 Non-pressure chronic ulcer of other part of left lower leg with other 01/16/2021 No Yes specified severity Inactive Problems Resolved Problems Electronic Signature(s) Signed: 05/28/2021 1:35:46 PM By: Worthy Keeler PA-C Entered By: Worthy Keeler on 05/28/2021 13:35:46 [name] ([id number]) -------------------------------------------------------------------------------- Progress Note Details Patient Name: [name] Date of Service: 05/28/2021 1:15 PM Medical Record Number: [id number] Patient Account Number: [id number] Date of Birth/Sex: Jan 22, 1946 (75 y.o. F) Treating RN: Carlene Coria Primary Care Provider: [name] Other Clinician: Referring Provider: [name] Treating Provider/Extender: Skipper Cliche in Treatment: 18 Subjective Chief Complaint Information obtained from Patient Left LE Ulcer History of Present Illness (HPI) 03/15/2020 upon evaluation today patient presents today for initial evaluation here in our clinic concerning a wound that is on the left posterior lower extremity. Unfortunately this has been giving the patient some discomfort at this point which she notes has been affected her pretty much daily. The patient does  have a history of venous insufficiency, hypertension, congestive heart failure, and a history of breast cancer. Fortunately there does not appear to be evidence of active infection at this time which is great news. No fevers, chills, nausea, vomiting, or diarrhea. Wound is not extremely large but does have some  slough covering the surface of the wound. This is going require sharp debridement today. 03/15/2020 upon evaluation today patient appears to be doing fairly well in regard to her wound. She does have some slough noted on the surface of the wound currently but I do believe the Iodoflex has been beneficial over the past week. There is no signs of active infection at this time which is great news and overall very pleased with the progress. No fevers, chills, nausea, vomiting, or diarrhea. 04/02/2020 upon evaluation today patient appears to be doing a little better in regard to her wound size wise this is not tremendously smaller she does have some slough buildup on the surface of the wound. With that being said this is going require some sharp debridement today to clear away some of the necrotic debris. Fortunately there is no evidence of active infection at this time. No fevers, chills, nausea, vomiting, or diarrhea. 04/10/2020 on evaluation today patient appears to be doing well with regard to her ulcer which is measuring somewhat smaller today. She actually has more granulation tissue noted which is also good news is no need for sharp debridement today. Fortunately there is no evidence of infection either which is also excellent. No fevers, chills, nausea, vomiting, or diarrhea. 04/26/2020 on evaluation today patient's wound actually is showing signs of good improvement which is great news. There does not appear to be any evidence of active infection. I do believe she is tolerating the collagen at this point. 05/03/2020 upon evaluation today patient's wound actually showed signs of good granulation at this time there does not appear to be any evidence of active infection which is great news and overall very pleased with where things stand. With that being said I do believe that the patient is can require some sharp debridement today but fortunately nothing too significant. 05/11/20 on evaluation today patient  appears to be doing well in regard to her leg ulcer. She's been tolerating the dressing changes without complication. Fortunately there is no signs of active infection at this time. Overall I'm very pleased with where things stand. 05/18/2020 upon evaluation today patient's wound is showing signs of improvement is very dry and I think the collagen for the most part just dried to the wound bed. I am going to clean this away with sharp debridement in order to get this under control in my opinion. 05/25/2020 upon evaluation today patient actually appears to be doing well in regard to her leg ulcer. In fact upon inspection it appears she could potentially be completely healed but that is not guaranteed based on what we are seeing. There does not appear to be any signs of active infection which is great news. 05/31/2020 on evaluation today patient appears to be doing well with regard to her wounds. She in fact appears to be almost completely healed on the left leg there is just a very small opening remaining point 1 06/08/2020 upon evaluation today patient appears to be doing excellent in regard to her leg ulcer on the left in fact it appears to be that she is completely healed as of today. I see no signs of active infection at this time which is great news.  No fevers, chills, nausea, vomiting, or diarrhea. READMISSION 01/16/2021 Patient that we have had in this clinic with a wound on the left posterior calf in the summer 2021 extending into October. She was felt to have chronic venous insufficiency. Per the patient's description she was discharged with what sounds like external compression stockings although she could not afford the "$75 per leg". She therefore did not get anything. Apparently her wound that she has currently is been in existence since January. She has been followed at vein and vascular with Unna boots and I think calcium alginate. She had ABIs done on 06/25/2020 that showed an  noncompressible ABI on the right leg and the left leg but triphasic waveforms with good great toe pressures and a TBI of 0.90 on the right and 0.91 on the left Surprisingly the patient also had venous reflux studies that were really unremarkable. This included no evidence of DVTs or SVTs but there was no evidence of deep venous insufficiency or superficial venous insufficiency in the greater saphenous or short saphenous veins bilaterally. I would wonder about more central venous issues or perhaps this is all lymphedema 01/25/2021 upon evaluation today patient appears to be doing decently well and guard to her wound. The good news is the Iodoflex that is job she saw Dr. Dellia Nims last week for readmission and to be honest I think that this has done extremely well over that week. I do believe that she can tolerate the sharp debridement today which will be good. I think that cleaning off the wound will likely allow [place] to be able to use a different type of dressing I do not think the Iodoflex will even be necessary based on how clean the wound looks. Fortunately there is no sign of active infection at this time which is great news. No fevers, chills, nausea, vomiting, or diarrhea. MELANIA, KIRKS ([id number]) 02/12/2021 upon evaluation today patient appears to be doing well with regard to her leg ulcer. We did switch to alginate when she came for nurse visit I think is doing much better for her. Fortunately there does not appear to be any signs of active infection at this time which is great news. No fevers, chills, nausea, vomiting, or diarrhea. 02/18/2021 upon evaluation today patient's wound is actually showing signs of good improvement. I am very pleased with where things stand currently. I do not see any signs of active infection which is great news and overall I feel like she is making good progress. The patient likewise is happy that things are doing so well and that this is measuring somewhat  smaller. 02/25/2021 upon evaluation today patient actually appears to be doing quite well in regard to her wounds. She has been tolerating the dressing changes without complication. Fortunately there does not appear to be any signs of active infection which is great news and overall I am extremely pleased with where things stand. No fevers, chills, nausea, vomiting, or diarrhea. She does have a lot of edema I really do think she needs compression socks to be worn in order to prevent things from causing additional issues for her currently. Especially on the right leg she should be wearing compression socks all the time to be honest. 03/04/2021 upon evaluation today patient's leg ulcer actually appears to be doing pretty well which is great news. There does not appear to be any significant change but overall the appearance is better even though the size is not necessarily reflecting a great improvement. Fortunately there does not  appear to be any signs of active infection. No fevers, chills, nausea, vomiting, or diarrhea. 03/12/2021 upon evaluation today patient appears to be doing well with regard to her wounds. She has been tolerating the dressing changes without complication. The good news is that the wound on her left lateral leg is doing much better and is showing signs of good improvement. Overall her swelling is controlled well as well. She does need some compression socks she plans to go Jefferey socks next week. 03/18/2021 on evaluation today patient's wound actually is showing signs of good improvement. She does have a little bit of slough this can require some sharp debridement today. 03/25/2021 upon evaluation today patient appears to be doing well with regard to her wound on the left lateral leg. She has been tolerating the dressing changes without complication. Fortunately there does not appear to be any signs of active infection at this time. No fever chills no 04/01/2021 upon evaluation today  patient's wound is showing signs of improvement and overall very pleased with where things stand at this point. There is no evidence of active infection which is great news as well and in general I think that she is making great progress. 04/09/2021 upon evaluation today patient appears to be doing well with regard to her ankle ulcer. She is making good progress currently which is great news. There does not appear to be any signs of infection also excellent news. In general I am extremely pleased with where things stand today. No fevers, chills, nausea, vomiting, or diarrhea. 04/23/2021 upon inspection today patient appears to be doing quite well with regard to her leg ulcer. She has been tolerating the dressing changes without complication. She is can require some sharp debridement today but overall seems to be doing excellent. 05/06/2021 upon evaluation today patient unfortunately appears to be doing poorly overall in regard to her swelling. She is actually significantly more swollen than last week despite the fact that her wound is doing better this is not a good trend. I think that she probably needs to see her cardiologist ASAP and I discussed that with her today. This includes the fact that honestly I think she probably is getting need an increase in her diuretic or something to try to clear away some of the excess fluid that she is experiencing at the moment. She is not been doing her pumps even twice a day but again right now more concerned that if she were to do the pump she would fluid overload her lungs causing other issues as far as her breathing is concerned. Obviously I think she needs to contact them and move up the appointment for the 12th of something sooner 2 weeks is a bit too long to wait with how swollen she has. 05/14/2021 upon evaluation today patient's wound actually showing signs of being a little bit smaller compared to previous. She has been making good progress however.  Fortunately there does not appear to be any evidence of active infection which is great. Overall I am extremely pleased in that regard. Nonetheless I am still concerned that she is having quite a bit of issue as far as the swelling is concerned she has been placed on fluid restriction as well as an increase in the furosemide by her doctor. We will see how things progress. 05/21/2021 upon evaluation today patient appears to be doing well with regard to her wound in general. She has been tolerating the dressing changes without complication. With that being said she  has been using silver cell for a while and while this was showing signs of improvement it is now gotten to the point where this has slowed down quite significantly. I do believe that switching to a different dressing may be beneficial for her today. 05/28/2021 upon evaluation today patient actually appears to be doing quite well in regard to her wound. In fact there is really not any need for sharp debridement today based on what I see. The Hydrofera Blue is doing great and overall I think that she is managing quite nicely with the compression wrapping. Her edema is still down quite a bit with the wrapping in place. Objective Constitutional Well-nourished and well-hydrated in no acute distress. Vitals Time Taken: 3:23 PM, Height: 69 in, Weight: 244 lbs, BMI: 36, Temperature: 98.7 F, Pulse: 64 bpm, Respiratory Rate: 18 breaths/min, Blood Pressure: 122/56 mmHg. [place], [place] ([id number]) Respiratory normal breathing without difficulty. Psychiatric this patient is able to make decisions and demonstrates good insight into disease process. Alert and Oriented x 3. pleasant and cooperative. General Notes: Upon inspection patient's wound bed showed signs of good granulation and epithelization at this point. There was really no significant slough buildup noted over the surface of the wound there is a lot of good granulation and overall I feel  like that the patient is doing quite well. Integumentary (Hair, Skin) Wound #2 status is Open. Original cause of wound was Gradually Appeared. The date acquired was: 09/08/2020. The wound has been in treatment 18 weeks. The wound is located on the Left,Lateral Lower Leg. The wound measures 2.3cm length x 0.8cm width x 0.1cm depth; 1.445cm^2 area and 0.145cm^3 volume. There is Fat Layer (Subcutaneous Tissue) exposed. There is no tunneling or undermining noted. There is a medium amount of serosanguineous drainage noted. There is large (67-100%) red granulation within the wound bed. There is a small (1-33%) amount of necrotic tissue within the wound bed including Adherent Slough. 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 There was a Three Layer Compression Therapy Procedure by Carlene Coria, RN. Post procedure Diagnosis Wound #2: Same as Pre-Procedure Plan Follow-up Appointments: Return Appointment in 1 week. Bathing/ Shower/ Hygiene: May shower with wound dressing protected with water repellent cover or cast protector. Edema Control - Lymphedema / Segmental Compressive Device / Other: Optional: One layer of unna paste to top of compression wrap (to act as an anchor). Tubigrip single layer applied. - RIGHT LEG tubi grip applied "E" until you get your compression sock use this and it's hand washable Patient to wear own compression stockings. Remove compression stockings every night before going to bed and put on every morning when getting up. - on RIGHT LEG call [facility] [facility] for compression sock 15-22 mm/Hg, measurements and phone number on blue sheet [employer] on  [employer] in [place] also has the compression socks Elevate, Exercise Daily and Avoid Standing for Long Periods of Time. Elevate legs to the level of the heart and pump ankles as often as possible Elevate leg(s) parallel to the floor when sitting. Compression Pump: Use compression pump on left lower extremity for 60 minutes, twice daily. - You may pump over wraps Compression Pump: Use compression pump on right lower extremity for 60 minutes,  twice daily. - You may pump over wraps Additional Orders / Instructions: Wound #2 Left,Lateral Lower Leg: Follow Nutritious Diet and Increase Protein Intake - Increase protein intake. WOUND #2: - Lower Leg Wound Laterality: Left, Lateral Cleanser: Soap and Water 1 x Per Week/30 Days Discharge Instructions: Gently cleanse wound with antibacterial soap, rinse and pat dry prior to dressing wounds Primary Dressing: Hydrofera Blue Ready Transfer Foam, 2.5x2.5 (in/in) 1 x Per Week/30 Days Discharge Instructions: Apply Hydrofera Blue Ready to wound bed as directed Secondary Dressing: ABD Pad 5x9 (in/in) 1 x Per Week/30 Days Discharge Instructions: Cover with ABD pad TALEYA, WHITCHER ([id number]) Compression Wrap: Profore Lite LF 3 Multilayer Compression Bandaging System 1 x Per Week/30 Days Discharge Instructions: Apply 3 multi-layer wrap as prescribed. 1. Would recommend currently that we go ahead and continue with the wound care measures as before and the patient is in agreement with plan. This includes the use of the [facility] which I think is still doing excellent currently. 2. I am also going to recommend that we have the patient continue with the 3 layer compression wrap which is doing a great job keeping the edema under good control. We will see patient back for reevaluation in 1 week here in the clinic. If anything worsens or changes patient will contact our office for additional recommendations. This will be a nurse visit next week and then  I will see her in 2 weeks for provider visit. Electronic Signature(s) Signed: 05/28/2021 1:50:00 PM By: Worthy Keeler PA-C Entered By: Worthy Keeler on 05/28/2021 13:50:00 [name] ([id number]) -------------------------------------------------------------------------------- SuperBill Details Patient Name: [name] Date of Service: 05/28/2021 Medical Record Number: [id number] Patient Account Number: [id number] Date of Birth/Sex: 03/23/1946 (75 y.o. F) Treating RN: Carlene Coria Primary Care Provider: [name] Other Clinician: Referring Provider: [name] Treating Provider/Extender: Skipper Cliche in Treatment: 18 Diagnosis Coding ICD-10 Codes Code Description I89.0 Lymphedema, not elsewhere classified I87.332 Chronic venous hypertension (idiopathic) with ulcer and inflammation of left lower extremity L97.828 Non-pressure chronic ulcer of other part of left lower leg with other specified severity Facility Procedures CPT4 Code: [id number] Description: (Facility Use Only) 29581LT - [place] LWR LT LEG Modifier: Quantity: 1 Physician Procedures CPT4 Code Description: [id number] - WC PHYS LEVEL 3 - EST PT Modifier: Quantity: 1 CPT4 Code Description: ICD-10 Diagnosis Description I89.0 Lymphedema, not elsewhere classified I87.332 Chronic venous hypertension (idiopathic) with ulcer and inflammation of le L97.828 Non-pressure chronic ulcer of other part of left lower leg with  other spec Modifier: ft lower extremity ified severity Quantity: Electronic Signature(s) Signed: 05/28/2021 1:50:09 PM By: Worthy Keeler PA-C Entered By: Worthy Keeler on 05/28/2021 13:50:09

## 2021-05-30 NOTE — Progress Notes (Signed)
ZORA, GLENDENNING ([id number]) Visit Report for 05/28/2021 Arrival Information Details Patient Name: [name], [name] Date of Service: 05/28/2021 1:15 PM Medical Record Number: [id number] Patient Account Number: [id number] Date of Birth/Sex: [birth year] (75 y.o. F) Treating RN: Carlene Coria Primary Care Darnel Mchan: [name] Other Clinician: Referring Alyvia Derk: [name] Treating Cosmo Tetreault/Extender: Skipper Cliche in Treatment: 18 Visit Information History Since Last Visit All ordered tests and consults were completed: No Patient Arrived: Wheel Chair Added or deleted any medications: No Arrival Time: 13:22 Any new allergies or adverse reactions: No Accompanied By: self Had a fall or experienced change in No Transfer Assistance: Manual activities of daily living that may affect Patient Identification Verified: Yes risk of falls: Secondary Verification Process Completed: Yes Signs or symptoms of abuse/neglect since last visito No Patient Requires Transmission-Based Precautions: No Hospitalized since last visit: No Patient Has Alerts: Yes Implantable device outside of the clinic excluding No Patient Alerts: NOT DIABETIC cellular tissue based products placed in the center since last visit: Has Dressing in Place as Prescribed: Yes Has Compression in Place as Prescribed: Yes Pain Present Now: No Electronic Signature(s) Signed: 05/30/2021 1:17:18 PM By: Carlene Coria RN Entered By: Carlene Coria on 05/28/2021 13:23:01 [name] ([id number]) -------------------------------------------------------------------------------- Clinic Level of Care Assessment Details Patient Name: [name] Date of Service: 05/28/2021 1:15 PM Medical Record Number: [id number] Patient Account Number: [id number] Date of Birth/Sex: 1945-10-18 (75 y.o. F) Treating RN: Carlene Coria Primary Care Giavonni Cizek: [name] Other Clinician: Referring Curtez Brallier: [name] Treating Carlyle Achenbach/Extender:  Skipper Cliche in Treatment: 18 Clinic Level of Care Assessment Items TOOL 1 Quantity Score [] - Use when EandM and Procedure is performed on INITIAL visit 0 ASSESSMENTS - Nursing Assessment / Reassessment [] - General Physical Exam (combine w/ comprehensive assessment (listed just below) when performed on new 0 pt. evals) [] - 0 Comprehensive Assessment (HX, ROS, Risk Assessments, Wounds Hx, etc.) ASSESSMENTS - Wound and Skin Assessment / Reassessment [] - Dermatologic / Skin Assessment (not related to wound area) 0 ASSESSMENTS - Ostomy and/or Continence Assessment and Care [] - Incontinence Assessment and Management 0 [] - 0 Ostomy Care Assessment and Management (repouching, etc.) PROCESS - Coordination of Care [] - Simple Patient / Family Education for ongoing care 0 [] - 0 Complex (extensive) Patient / Family Education for ongoing care [] - 0 Staff obtains Programmer, systems, Records, Test Results / Process Orders [] - 0 Staff telephones HHA, Nursing Homes / Clarify orders / etc [] - 0 Routine Transfer to another Facility (non-emergent condition) [] - 0 Routine Hospital Admission (non-emergent condition) [] - 0 New Admissions / Biomedical engineer / Ordering NPWT, Apligraf, etc. [] - 0 Emergency Hospital Admission (emergent condition) PROCESS - Special Needs [] - Pediatric / Minor Patient Management 0 [] - 0 Isolation Patient Management [] - 0 Hearing / Language / Visual special needs [] - 0 Assessment of Community assistance (transportation, D/C planning, etc.) [] - 0 Additional assistance / Altered mentation [] - 0 Support Surface(s) Assessment (bed, cushion, seat, etc.) INTERVENTIONS - Miscellaneous [] - External ear exam 0 [] - 0 Patient Transfer (multiple staff / Civil Service fast streamer / Similar devices) [] - 0 Simple Staple / Suture removal (25 or less) [] - 0 Complex Staple / Suture removal (26 or more) [] - 0 Hypo/Hyperglycemic Management (do not check if billed  separately) [] - 0 Ankle / Brachial Index (ABI) - do not check if billed separately Has the patient been seen at the hospital within the  last three years: Yes Total Score: 0 Level Of Care: ____ [name] ([id number]) Electronic Signature(s) Signed: 05/30/2021 1:17:18 PM By: Carlene Coria RN Entered By: Carlene Coria on 05/28/2021 13:41:41 [name] ([id number]) -------------------------------------------------------------------------------- Compression Therapy Details Patient Name: [name] Date of Service: 05/28/2021 1:15 PM Medical Record Number: [id number] Patient Account Number: [id number] Date of Birth/Sex: February 14, 1946 (75 y.o. F) Treating RN: Carlene Coria Primary Care Shermar Friedland: [name] Other Clinician: Referring Alexi Geibel: [name] Treating Shawonda Kerce/Extender: Skipper Cliche in Treatment: 18 Compression Therapy Performed for Wound Assessment: Wound #2 Left,Lateral Lower Leg Performed By: Clinician Carlene Coria, RN Compression Type: Three Layer Post Procedure Diagnosis Same as Pre-procedure Electronic Signature(s) Signed: 05/30/2021 1:17:18 PM By: Carlene Coria RN Entered By: Carlene Coria on 05/28/2021 13:40:53 [name] ([id number]) -------------------------------------------------------------------------------- Encounter Discharge Information Details Patient Name: [name] Date of Service: 05/28/2021 1:15 PM Medical Record Number: [id number] Patient Account Number: [id number] Date of Birth/Sex: 05-07-1946 (75 y.o. F) Treating RN: Carlene Coria Primary Care Hakim Minniefield: [name] Other Clinician: Referring Shelonda Saxe: [name] Treating Ruven Corradi/Extender: Skipper Cliche in Treatment: 18 Encounter Discharge Information Items Discharge Condition: Stable Ambulatory Status: Wheelchair Discharge Destination: Home Transportation: Private Auto Accompanied By: self Schedule Follow-up Appointment: Yes Clinical Summary of Care:  Patient Declined Electronic Signature(s) Signed: 05/30/2021 1:17:18 PM By: Carlene Coria RN Entered By: Carlene Coria on 05/28/2021 13:55:31 [name] ([id number]) -------------------------------------------------------------------------------- Lower Extremity Assessment Details Patient Name: [name] Date of Service: 05/28/2021 1:15 PM Medical Record Number: [id number] Patient Account Number: [id number] Date of Birth/Sex: 1946/05/17 (75 y.o. F) Treating RN: Carlene Coria Primary Care Ozan Maclay: [name] Other Clinician: Referring Tennie Grussing: [name] Treating Palma Buster/Extender: Skipper Cliche in Treatment: 18 Edema Assessment Assessed: [Left: No] [Right: No] Edema: [Left: Ye] [Right: s] Calf Left: Right: Point of Measurement: 27 cm From Medial Instep 39 cm Ankle Left: Right: Point of Measurement: 10 cm From Medial Instep 26 cm Vascular Assessment Pulses: Dorsalis Pedis Palpable: [Left:Yes] Electronic Signature(s) Signed: 05/30/2021 1:17:18 PM By: Carlene Coria RN Entered By: Carlene Coria on 05/28/2021 13:33:01 [name] ([id number]) -------------------------------------------------------------------------------- Multi Wound Chart Details Patient Name: [name] Date of Service: 05/28/2021 1:15 PM Medical Record Number: [id number] Patient Account Number: [id number] Date of Birth/Sex: May 31, 1946 (75 y.o. F) Treating RN: Carlene Coria Primary Care Lively Haberman: [name] Other Clinician: Referring Horton Ellithorpe: [name] Treating Alvita Fana/Extender: Skipper Cliche in Treatment: 18 Vital Signs Height(in): 66 Pulse(bpm): 61 Weight(lbs): 244 Blood Pressure(mmHg): 122/56 Body Mass Index(BMI): 36 Temperature(F): 98.7 Respiratory Rate(breaths/min): 18 Photos: [N/A:N/A] Wound Location: Left, Lateral Lower Leg N/A N/A Wounding Event: Gradually Appeared N/A N/A Primary Etiology: Venous Leg Ulcer N/A N/A Comorbid History: Cataracts, Lymphedema,  Sleep N/A N/A Apnea, Congestive Heart Failure, Hypertension, Osteoarthritis, Neuropathy, Received Chemotherapy, Received Radiation Date Acquired: 09/08/2020 N/A N/A Weeks of Treatment: 18 N/A N/A Wound Status: Open N/A N/A Measurements L x W x D (cm) 2.3x0.8x0.1 N/A N/A Area (cm) : 1.445 N/A N/A Volume (cm) : 0.145 N/A N/A % Reduction in Area: 69.30% N/A N/A % Reduction in Volume: 69.20% N/A N/A Classification: Full Thickness Without Exposed N/A N/A Support Structures Exudate Amount: Medium N/A N/A Exudate Type: Serosanguineous N/A N/A Exudate Color: red, brown N/A N/A Granulation Amount: Large (67-100%) N/A N/A Granulation Quality: Red N/A N/A Necrotic Amount: Small (1-33%) N/A N/A Exposed Structures: Fat Layer (Subcutaneous Tissue): N/A N/A Yes Fascia: No Tendon: No Muscle: No Joint: No Bone: No Epithelialization: Medium (34-66%) N/A N/A Treatment Notes Electronic Signature(s) Signed: 05/30/2021 1:17:18 PM By: Carlene Coria RN Entered  ByCarlene Coria on 05/28/2021 13:37:23 [name] ([id number]) -------------------------------------------------------------------------------- Multi-Disciplinary Care Plan Details Patient Name: VEENA, STURGESS Date of Service: 05/28/2021 1:15 PM Medical Record Number: [id number] Patient Account Number: [id number] Date of Birth/Sex: July 13, 1946 (75 y.o. F) Treating RN: Carlene Coria Primary Care Provider: [name] Other Clinician: Referring Provider: [name] Treating Provider/Extender: Skipper Cliche in Treatment: 18 Active Inactive Wound/Skin Impairment Nursing Diagnoses: Impaired tissue integrity Goals: Patient/caregiver will verbalize understanding of skin care regimen Date Initiated: 01/16/2021 Date Inactivated: 02/18/2021 Target Resolution Date: 02/16/2021 Goal Status: Met Ulcer/skin breakdown will have a volume reduction of 30% by week 4 Date Initiated: 01/16/2021 Date Inactivated: 02/18/2021 Target Resolution  Date: 02/16/2021 Goal Status: Met Ulcer/skin breakdown will have a volume reduction of 50% by week 8 Date Initiated: 01/16/2021 Date Inactivated: 04/01/2021 Target Resolution Date: 03/18/2021 Goal Status: Unmet Unmet Reason: comorbities Ulcer/skin breakdown will have a volume reduction of 80% by week 12 Date Initiated: 04/01/2021 Date Inactivated: 04/23/2021 Target Resolution Date: 04/18/2021 Goal Status: Unmet Unmet Reason: comorbities Ulcer/skin breakdown will heal within 14 weeks Date Initiated: 04/01/2021 Target Resolution Date: 05/19/2021 Goal Status: Active Interventions: Assess ulceration(s) every visit Treatment Activities: Topical wound management initiated : 01/16/2021 Notes: Electronic Signature(s) Signed: 05/30/2021 1:17:18 PM By: Carlene Coria RN Entered By: Carlene Coria on 05/28/2021 13:37:12 [name] ([id number]) -------------------------------------------------------------------------------- Pain Assessment Details Patient Name: [name] Date of Service: 05/28/2021 1:15 PM Medical Record Number: [id number] Patient Account Number: [id number] Date of Birth/Sex: 1946/06/17 (75 y.o. F) Treating RN: Carlene Coria Primary Care Provider: [name] Other Clinician: Referring Provider: [name] Treating Provider/Extender: Skipper Cliche in Treatment: 18 Active Problems Location of Pain Severity and Description of Pain Patient Has Paino No Site Locations Pain Management and Medication Current Pain Management: Electronic Signature(s) Signed: 05/30/2021 1:17:18 PM By: Carlene Coria RN Entered By: Carlene Coria on 05/28/2021 13:23:53 [name] ([id number]) -------------------------------------------------------------------------------- Patient/Caregiver Education Details Patient Name: [name] Date of Service: 05/28/2021 1:15 PM Medical Record Number: [id number] Patient Account Number: [id number] Date of Birth/Gender: 04-Oct-1945 (75 y.o.  F) Treating RN: Carlene Coria Primary Care Physician: [name] Other Clinician: Referring Physician: [name] Treating Physician/Extender: Skipper Cliche in Treatment: 18 Education Assessment Education Provided To: Patient Education Topics Provided Wound/Skin Impairment: Methods: Explain/Verbal Responses: State content correctly Electronic Signature(s) Signed: 05/30/2021 1:17:18 PM By: Carlene Coria RN Entered By: Carlene Coria on 05/28/2021 13:42:30 [name] ([id number]) -------------------------------------------------------------------------------- Wound Assessment Details Patient Name: [name] Date of Service: 05/28/2021 1:15 PM Medical Record Number: [id number] Patient Account Number: [id number] Date of Birth/Sex: 11-25-1945 (75 y.o. F) Treating RN: Carlene Coria Primary Care Provider: [name] Other Clinician: Referring Provider: [name] Treating Provider/Extender: Skipper Cliche in Treatment: 18 Wound Status Wound Number: 2 Primary Venous Leg Ulcer Etiology: Wound Location: Left, Lateral Lower Leg Wound Open Wounding Event: Gradually Appeared Status: Date Acquired: 09/08/2020 Comorbid Cataracts, Lymphedema, Sleep Apnea, Congestive Heart Weeks Of Treatment: 18 History: Failure, Hypertension, Osteoarthritis, Neuropathy, Clustered Wound: No Received Chemotherapy, Received Radiation Photos Wound Measurements Length: (cm) 2.3 Width: (cm) 0.8 Depth: (cm) 0.1 Area: (cm) 1.445 Volume: (cm) 0.145 % Reduction in Area: 69.3% % Reduction in Volume: 69.2% Epithelialization: Medium (34-66%) Tunneling: No Undermining: No Wound Description Classification: Full Thickness Without Exposed Support Structu Exudate Amount: Medium Exudate Type: Serosanguineous Exudate Color: red, brown res Foul Odor After Cleansing: No Slough/Fibrino Yes Wound Bed Granulation Amount: Large (67-100%) Exposed Structure Granulation Quality: Red Fascia  Exposed: No Necrotic Amount: Small (1-33%) Fat Layer (Subcutaneous Tissue) Exposed: Yes Necrotic  Quality: Adherent Slough Tendon Exposed: No Muscle Exposed: No Joint Exposed: No Bone Exposed: No Treatment Notes Wound #2 (Lower Leg) Wound Laterality: Left, Lateral Cleanser Soap and Water Discharge Instruction: Gently cleanse wound with antibacterial soap, rinse and pat dry prior to dressing wounds Peri-Wound Care RENLEY, GUTMAN ([id number]) Topical Primary Dressing Hydrofera Blue Ready Transfer Foam, 2.5x2.5 (in/in) Discharge Instruction: Apply Hydrofera Blue Ready to wound bed as directed Secondary Dressing ABD Pad 5x9 (in/in) Discharge Instruction: Cover with ABD pad Secured With Compression Wrap Profore Lite LF 3 Multilayer Compression [place] Discharge Instruction: Apply 3 multi-layer wrap as prescribed. Compression Stockings Add-Ons Electronic Signature(s) Signed: 05/30/2021 1:17:18 PM By: Carlene Coria RN Entered By: Carlene Coria on 05/28/2021 13:32:10 [name] ([id number]) -------------------------------------------------------------------------------- Vitals Details Patient Name: [name] Date of Service: 05/28/2021 1:15 PM Medical Record Number: [id number] Patient Account Number: [id number] Date of Birth/Sex: 09/04/1946 (75 y.o. F) Treating RN: Carlene Coria Primary Care Provider: [name] Other Clinician: Referring Provider: [name] Treating Provider/Extender: Skipper Cliche in Treatment: 18 Vital Signs Time Taken: 15:23 Temperature (F): 98.7 Height (in): 69 Pulse (bpm): 64 Weight (lbs): 244 Respiratory Rate (breaths/min): 18 Body Mass Index (BMI): 36 Blood Pressure (mmHg): 122/56 Reference Range: 80 - 120 mg / dl Electronic Signature(s) Signed: 05/30/2021 1:17:18 PM By: Carlene Coria RN Entered By: Carlene Coria on 05/28/2021 13:23:40

## 2021-06-03 ENCOUNTER — Ambulatory Visit: Payer: Medicare Other | Admitting: Internal Medicine

## 2021-06-07 ENCOUNTER — Other Ambulatory Visit: Payer: Self-pay

## 2021-06-07 DIAGNOSIS — I87332 Chronic venous hypertension (idiopathic) with ulcer and inflammation of left lower extremity: Secondary | ICD-10-CM | POA: Diagnosis not present

## 2021-06-07 NOTE — Progress Notes (Signed)
VASHON, ARCH ([id number]) Visit Report for 06/07/2021 Arrival Information Details Patient Name: [name], [name] Date of Service: 06/07/2021 11:30 AM Medical Record Number: [id number] Patient Account Number: [id number] Date of Birth/Sex: 08/25/1946 (75 y.o. F) Treating RN: Dolan Amen Primary Care Laneice Meneely: [name] Other Clinician: Referring Devota Viruet: [name] Treating Darielle Hancher/Extender: Skipper Cliche in Treatment: 20 Visit Information History Since Last Visit Pain Present Now: No Patient Arrived: Walker Arrival Time: 12:00 Accompanied By: self Transfer Assistance: Manual Patient Identification Verified: Yes Secondary Verification Process Completed: Yes Patient Requires Transmission-Based Precautions: No Patient Has Alerts: Yes Patient Alerts: NOT DIABETIC Electronic Signature(s) Signed: 06/07/2021 1:33:06 PM By: Dolan Amen RN Entered By: Dolan Amen on 06/07/2021 12:35:25 [name] ([id number]) -------------------------------------------------------------------------------- Clinic Level of Care Assessment Details Patient Name: [name] Date of Service: 06/07/2021 11:30 AM Medical Record Number: [id number] Patient Account Number: [id number] Date of Birth/Sex: 1945/09/10 (75 y.o. F) Treating RN: Dolan Amen Primary Care [place] Huberty: [name] Other Clinician: Referring Sehaj Kolden: [name] Treating Laverda Stribling/Extender: Skipper Cliche in Treatment: 20 Clinic Level of Care Assessment Items TOOL 1 Quantity Score []  - Use when EandM and Procedure is performed on INITIAL visit 0 ASSESSMENTS - Nursing Assessment / Reassessment []  - General Physical Exam (combine w/ comprehensive assessment (listed just below) when performed on new 0 pt. evals) []  - 0 Comprehensive Assessment (HX, ROS, Risk Assessments, Wounds Hx, etc.) ASSESSMENTS - Wound and Skin Assessment / Reassessment []  - Dermatologic / Skin Assessment (not related to wound  area) 0 ASSESSMENTS - Ostomy and/or Continence Assessment and Care []  - Incontinence Assessment and Management 0 []  - 0 Ostomy Care Assessment and Management (repouching, etc.) PROCESS - Coordination of Care []  - Simple Patient / Family Education for ongoing care 0 []  - 0 Complex (extensive) Patient / Family Education for ongoing care []  - 0 Staff obtains Programmer, systems, Records, Test Results / Process Orders []  - 0 Staff telephones HHA, Nursing Homes / Clarify orders / etc []  - 0 Routine Transfer to another Facility (non-emergent condition) []  - 0 Routine Hospital Admission (non-emergent condition) []  - 0 New Admissions / Biomedical engineer / Ordering NPWT, Apligraf, etc. []  - 0 Emergency Hospital Admission (emergent condition) PROCESS - Special Needs []  - Pediatric / Minor Patient Management 0 []  - 0 Isolation Patient Management []  - 0 Hearing / Language / Visual special needs []  - 0 Assessment of Community assistance (transportation, D/C planning, etc.) []  - 0 Additional assistance / Altered mentation []  - 0 Support Surface(s) Assessment (bed, cushion, seat, etc.) INTERVENTIONS - Miscellaneous []  - External ear exam 0 []  - 0 Patient Transfer (multiple staff / Civil Service fast streamer / Similar devices) []  - 0 Simple Staple / Suture removal (25 or less) []  - 0 Complex Staple / Suture removal (26 or more) []  - 0 Hypo/Hyperglycemic Management (do not check if billed separately) []  - 0 Ankle / Brachial Index (ABI) - do not check if billed separately Has the patient been seen at the hospital within the last three years: Yes Total Score: 0 Level Of Care: ____ [name] ([id number]) Electronic Signature(s) Signed: 06/07/2021 1:33:06 PM By: Dolan Amen RN Entered By: Dolan Amen on 06/07/2021 12:36:21 [name] ([id number]) -------------------------------------------------------------------------------- Compression Therapy Details Patient Name: [name] Date of Service: 06/07/2021 11:30 AM Medical Record Number: [id number] Patient Account Number: [id number] Date of Birth/Sex: [birth year] (75 y.o. F) Treating RN: Dolan Amen Primary Care Kaamil Morefield: [name] Other Clinician: Referring Haeden Hudock: [name] Treating Eusebia Grulke/Extender: Skipper Cliche  in Treatment: 20 Compression Therapy Performed for Wound Assessment: Wound #2 Left,Lateral Lower Leg Performed By: Cora Daniels, RN Compression Type: Three Layer Notes pt tolerating wrap Electronic Signature(s) Signed: 06/07/2021 1:33:06 PM By: Dolan Amen RN Entered By: Dolan Amen on 06/07/2021 12:35:54 [name] ([id number]) -------------------------------------------------------------------------------- Encounter Discharge Information Details Patient Name: [name] Date of Service: 06/07/2021 11:30 AM Medical Record Number: [id number] Patient Account Number: [id number] Date of Birth/Sex: 26-Apr-1946 (75 y.o. F) Treating RN: Dolan Amen Primary Care Aliscia Clayton: [name] Other Clinician: Referring Georgi Tuel: [name] Treating Darriana Deboy/Extender: Skipper Cliche in Treatment: 20 Encounter Discharge Information Items Discharge Condition: Stable Ambulatory Status: Walker Discharge Destination: Home Transportation: Private Auto Accompanied By: self Schedule Follow-up Appointment: Yes Clinical Summary of Care: Electronic Signature(s) Signed: 06/07/2021 1:33:06 PM By: Dolan Amen RN Entered By: Dolan Amen on 06/07/2021 12:36:15 [name] ([id number]) -------------------------------------------------------------------------------- Wound Assessment Details Patient Name: [name] Date of Service: 06/07/2021 11:30 AM Medical Record Number: [id number] Patient Account Number: [id number] Date of Birth/Sex: 04/27/1946 (75 y.o. F) Treating RN: Dolan Amen Primary Care Kanon Colunga: [name] Other  Clinician: Referring Talasia Saulter: [name] Treating Walida Cajas/Extender: Skipper Cliche in Treatment: 20 Wound Status Wound Number: 2 Primary Etiology: Venous Leg Ulcer Wound Location: Left, Lateral Lower Leg Wound Status: Open Wounding Event: Gradually Appeared Date Acquired: 09/08/2020 Weeks Of Treatment: 20 Clustered Wound: No Wound Measurements Length: (cm) 2.3 Width: (cm) 0.8 Depth: (cm) 0.1 Area: (cm) 1.445 Volume: (cm) 0.145 % Reduction in Area: 69.3% % Reduction in Volume: 69.2% Wound Description Classification: Full Thickness Without Exposed Support Structu Exudate Amount: Medium Exudate Type: Serosanguineous Exudate Color: red, brown res Treatment Notes Wound #2 (Lower Leg) Wound Laterality: Left, Lateral Cleanser Soap and Water Discharge Instruction: Gently cleanse wound with antibacterial soap, rinse and pat dry prior to dressing wounds Peri-Wound Care Topical Primary Dressing Hydrofera Blue Ready Transfer Foam, 2.5x2.5 (in/in) Discharge Instruction: Apply Hydrofera Blue Ready to wound bed as directed Secondary Dressing ABD Pad 5x9 (in/in) Discharge Instruction: Cover with ABD pad Secured With Compression Wrap Profore Lite LF 3 Multilayer Compression Bandaging System Discharge Instruction: Apply 3 multi-layer wrap as prescribed. Compression Stockings Add-Ons Electronic Signature(s) Signed: 06/07/2021 1:33:06 PM By: Dolan Amen RN Entered By: Dolan Amen on 06/07/2021 12:35:34

## 2021-06-10 ENCOUNTER — Encounter: Payer: Self-pay | Admitting: Surgery

## 2021-06-10 ENCOUNTER — Ambulatory Visit (INDEPENDENT_AMBULATORY_CARE_PROVIDER_SITE_OTHER): Payer: Medicare Other | Admitting: Surgery

## 2021-06-10 ENCOUNTER — Other Ambulatory Visit: Payer: Self-pay

## 2021-06-10 VITALS — BP 174/81 | HR 55 | Temp 98.4°F | Ht 69.0 in

## 2021-06-10 DIAGNOSIS — Z170 Estrogen receptor positive status [ER+]: Secondary | ICD-10-CM

## 2021-06-10 DIAGNOSIS — Z09 Encounter for follow-up examination after completed treatment for conditions other than malignant neoplasm: Secondary | ICD-10-CM

## 2021-06-10 DIAGNOSIS — C50411 Malignant neoplasm of upper-outer quadrant of right female breast: Secondary | ICD-10-CM | POA: Diagnosis not present

## 2021-06-10 NOTE — Progress Notes (Signed)
06/10/2021  History of Present Illness: [name] is a 76 y.o. female with a history of left breast cancer s/p lumpectomy and SLNBx in 2006.  She's also more recently s/p right breast lumpectomy and SLNBx in 06/2018 for right breast cancer.  She completed adjuvant radiation and is currently on Arimidex.  She has been stable and doing well.  She had a mammogram on 05/24/21 which I have personally viewed.  There are no suspicious findings on either breast, and only postsurgical changes.  She denies any issues with either breast, except some right upper breast intermittent soreness.  Denies any constant pain, worsening pain, masses, skin/nipple changes.  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    Breast cancer ([place]) 2019   right breast   CHF (congestive heart failure) ([place])    March 28, 2017 echo: EF 50-55%; 1) echo 07/2011: EF 20-25%, mild concentric hypertrophy, diffuse HK, regional WMA cannot be excluded, grade 1 DD, mitral valve with mild regurg, LA mildly dilated). 2) EF 40-45%, mild AS,                     Complication of anesthesia 2006   pt states "hard to wake up" after L breast surgery    Edema    GERD (gastroesophageal reflux disease)    Headache    Heart murmur    Hypertension    Hypokinesis    global, EF 35-45 % from Echo.   Malignant neoplasm of breast (female), unspecified site 06/14/2018   RIGHT T1b, N0; ER/PR positive, HER-2 negative.   Neuropathy    Obesity    Pain in joint, site unspecified    Personal history of chemotherapy 2006   left breast   Personal history of radiation therapy 2019   right breast   Personal history of radiation therapy 2006   left breast   Pneumonia    Sleep apnea    Tinea pedis    Unspecified hearing loss    bilateral     Past  Surgical History: Past Surgical History:  Procedure Laterality Date   BREAST BIOPSY Right 2019   11 oc-INVASIVE MAMMARY CARCINOMA, NO SPECIAL TYPE   BREAST BIOPSY Right 2019   1030 oc- neg   BREAST LUMPECTOMY  2006   left breast    CATARACT EXTRACTION     left eye   HERNIA REPAIR  05/25/2006   Incarcerated omentum in ventral hernia, Ventralight mesh, combined lap/ open with omental resection.    PARTIAL MASTECTOMY WITH NEEDLE LOCALIZATION Right 06/14/2018   Procedure: PARTIAL MASTECTOMY WITH NEEDLE LOCALIZATION;  Surgeon: Robert Bellow, MD;  Location: [facility] ORS;  Service: General;  Laterality: Right;   PORT-A-CATH REMOVAL  05/25/2006   SENTINEL NODE BIOPSY Right 06/14/2018   Procedure: SENTINEL NODE BIOPSY;  Surgeon: Robert Bellow, MD;  Location: [facility] ORS;  Service: General;  Laterality: Right;   TUBAL LIGATION      Home Medications: Prior to Admission medications   Medication Sig Start Date End Date Taking? Authorizing Provider  anastrozole (ARIMIDEX) 1 MG tablet TAKE 1 TABLET BY MOUTH DAILY. PATIENT MUST MAKE AN APPT TO SEE DR RAO 05/17/20  Yes Sindy Guadeloupe, MD  aspirin EC 81 MG tablet Take 81 mg by mouth daily.     Yes [provider]  cloNIDine (CATAPRES) 0.2 MG tablet Take 1 tablet (0.2 mg total) by mouth 2 (two) times daily. 06/27/16  Yes Gollan, Kathlene November, MD  clotrimazole (LOTRIMIN) 1 % cream Apply 1 application topically 2 (two) times daily.  02/18/18  Yes [provider]  [employer] CALCIUM 600 & VITAMIN D3 600-800 MG-UNIT TABS TAKE 1 TABLET BY MOUTH TWICE A DAY 06/21/19  Yes Sindy Guadeloupe, MD  diclofenac sodium (VOLTAREN) 1 % GEL Apply 4 g topically 4 (four) times daily. 06/22/19  Yes Delman Kitten, MD  diclofenac Sodium (VOLTAREN) 1 % GEL Apply 1 application topically as needed. 04/27/20  Yes [provider]  Incontinence Supply Disposable (BLADDER CONTROL PADS EX ABSORB) [place]  03/22/18  Yes [provider]  loratadine (CLARITIN) 10 MG  tablet Take 10 mg by mouth daily. 07/20/19  Yes [provider]  losartan (COZAAR) 25 MG tablet Take 25 mg by mouth daily.  05/11/18  Yes [provider]  methocarbamol (ROBAXIN) 500 MG tablet Take 500 mg by mouth 2 (two) times daily as needed. 05/02/20  Yes [provider]  omeprazole (PRILOSEC) 20 MG capsule Take 20 mg by mouth daily.   Yes [provider]  oxybutynin (DITROPAN) 5 MG tablet Take 5 mg by mouth 2 (two) times daily.   Yes [provider]  potassium chloride SA (K-DUR) 20 MEQ tablet Take 1 tablet (20 meq) by mouth once daily   Yes [provider]  tiZANidine (ZANAFLEX) 2 MG tablet 1 TAB BY MOUTH 3 TIMES DAILY AS NEEDED MUSCLE SPASM 02/22/19  Yes [provider]  torsemide (DEMADEX) 20 MG tablet TAKE 1 TABLET BY MOUTH TWICE A DAY 12/06/19  Yes Gollan, Kathlene November, MD  triamcinolone cream (KENALOG) 0.1 % Apply 1 application topically 2 (two) times daily. Back of legs 03/21/19  Yes [provider]  isosorbide mononitrate (IMDUR) 60 MG 24 hr tablet Take 1 tablet (60 mg total) by mouth 2 (two) times daily. 04/20/13 02/10/20  Minna Merritts, MD  anastrozole (ARIMIDEX) 1 MG tablet Take 1 tablet (1 mg total) by mouth daily. Patient MUST make an appointment to see Dr Janese Banks before prescription will be filled again 07/06/19   Sindy Guadeloupe, MD    Allergies: No Known Allergies  Review of Systems: Review of Systems  Constitutional:  Negative for chills and fever.  Respiratory:  Negative for shortness of breath.   Cardiovascular:  Negative for chest pain.  Gastrointestinal:  Negative for abdominal pain, nausea and vomiting.   Physical Exam BP (!) 174/81   Pulse (!) 55   Temp 98.4 F (36.9 C)   Ht 5' 9"  (1.753 m)   SpO2 95%   BMI 35.44 kg/m  CONSTITUTIONAL: No acute distress HEENT:  Normocephalic, atraumatic, extraocular motion intact. RESPIRATORY:  Normal respiratory effort without pathologic use of accessory  muscles. CARDIOVASCULAR: Regular rhythm and rate. BREAST:  Left breast s/p lumpectomy.  Scar well healed.  No palpable new masses, skin changes, or nipple changes.  Left axilla s/p SLNBx, with scar well healed.  No left axillary or supraclavicular lymphadenopathy.  Right breast s/p lumpectomy with scar well healed.  No palpable new masses, skin changes, or nipple changes.  No tenderness to palpation.  Right axilla s/p SLNBx with scar well healed.  No right axillary or supraclavicular lymphadenopathy. NEUROLOGIC:  Motor and sensation is grossly normal.  Cranial nerves are grossly intact. PSYCH:  Alert and oriented to person, place and time. Affect is normal.  Labs/Imaging: Mammogram 05/24/21: FINDINGS: There are no findings suspicious for malignancy. There is density and architectural distortion within the bilateral breasts, consistent with postsurgical changes. These are stable in comparison to prior. No suspicious mass, distortion, or microcalcifications are identified to suggest presence of malignancy.   IMPRESSION: No mammographic evidence of malignancy. A result letter of this screening mammogram will be mailed directly to the patient.   RECOMMENDATION: Screening mammogram in one year. (Code:SM-B-01Y)   BI-RADS CATEGORY  2: Benign.  Assessment and Plan: This is a 75 y.o. female s/p left breast lumpectomy in 2006 and right breast lumpectomy in 06/2018.   --Discussed with the patient that she's now three years out from her last surgery, and doing well from breast standpoint.  Mammogram was negative for new findings and her exam is reassuring.  Discussed with her that the soreness in the right breast can be related to radiation changes or scar changes, vs possibly MSK related since she's on a wheelchair.  If this gets worse or does not improve, she should contact us for further evaluation. --Otherwise follow up 1 year with bilateral mammogram.  Face-to-face time spent with the patient  and care providers was 25 minutes, with more than 50% of the time spent counseling, educating, and coordinating care of the patient.     Melvyn Neth, [facility]

## 2021-06-10 NOTE — Patient Instructions (Addendum)
Patient will be asked to return to the office in one year with a bilateral screening mammogram.  Continue self breast exams. Call office for any new breast issues or concerns.  

## 2021-06-11 ENCOUNTER — Encounter: Payer: Medicare Other | Attending: Physician Assistant | Admitting: Physician Assistant

## 2021-06-11 DIAGNOSIS — Z853 Personal history of malignant neoplasm of breast: Secondary | ICD-10-CM | POA: Diagnosis not present

## 2021-06-11 DIAGNOSIS — I509 Heart failure, unspecified: Secondary | ICD-10-CM | POA: Diagnosis not present

## 2021-06-11 DIAGNOSIS — I87332 Chronic venous hypertension (idiopathic) with ulcer and inflammation of left lower extremity: Secondary | ICD-10-CM | POA: Insufficient documentation

## 2021-06-11 DIAGNOSIS — L97828 Non-pressure chronic ulcer of other part of left lower leg with other specified severity: Secondary | ICD-10-CM | POA: Insufficient documentation

## 2021-06-11 DIAGNOSIS — I890 Lymphedema, not elsewhere classified: Secondary | ICD-10-CM | POA: Insufficient documentation

## 2021-06-11 DIAGNOSIS — I110 Hypertensive heart disease with heart failure: Secondary | ICD-10-CM | POA: Insufficient documentation

## 2021-06-12 ENCOUNTER — Other Ambulatory Visit: Payer: Self-pay

## 2021-06-12 ENCOUNTER — Ambulatory Visit (INDEPENDENT_AMBULATORY_CARE_PROVIDER_SITE_OTHER): Payer: Medicare Other | Admitting: Cardiovascular Disease

## 2021-06-12 ENCOUNTER — Encounter: Payer: Self-pay | Admitting: Cardiovascular Disease

## 2021-06-12 VITALS — BP 128/60 | HR 62 | Ht 69.0 in | Wt 254.1 lb

## 2021-06-12 DIAGNOSIS — G459 Transient cerebral ischemic attack, unspecified: Secondary | ICD-10-CM

## 2021-06-12 DIAGNOSIS — R0602 Shortness of breath: Secondary | ICD-10-CM | POA: Diagnosis not present

## 2021-06-12 DIAGNOSIS — I5032 Chronic diastolic (congestive) heart failure: Secondary | ICD-10-CM | POA: Diagnosis not present

## 2021-06-12 DIAGNOSIS — I890 Lymphedema, not elsewhere classified: Secondary | ICD-10-CM

## 2021-06-12 DIAGNOSIS — I10 Essential (primary) hypertension: Secondary | ICD-10-CM | POA: Diagnosis not present

## 2021-06-12 NOTE — Patient Instructions (Addendum)
Medication Instructions:   As you sleep in the day Torsemide  Take torsemide (40 mg) when you wake up (6 pm)  Take another torsemide (40 mg) at midnight  Cut back on fluids (coffee, ice tea, soda, water) Try popcicle, ice chips, gum, hard candy  Weight daily, dry weight.  First thing when you wake up after first void.   If you need a refill on your cardiac medications before your next appointment, please call your pharmacy.    Lab work: No new labs needed  Testing/Procedures: No new testing needed  Follow-Up: At [facility], you and your health needs are our priority.  As part of our continuing mission to provide you with exceptional heart care, we have created designated Provider Care Teams.  These Care Teams include your primary Cardiologist (physician) and Advanced Practice Providers (APPs -  Physician Assistants and Nurse Practitioners) who all work together to provide you with the care you need, when you need it.  You will need a follow up appointment in 2-3 weeks  Providers on your designated Care Team:   Murray Hodgkins, NP Christell Faith, PA-C Marrianne Mood, PA-C [place], [place]  COVID-19 Vaccine Information can be found at: [email or web address].uk For questions related to vaccine distribution or appointments, please email [email or web address] or call [phone].

## 2021-06-12 NOTE — Progress Notes (Signed)
Date:  06/12/2021   ID:  [name], DOB [birth year], MRN [id number]  Patient Location:  [place] [address]   Provider location:   [facility] office  PCP:  Donnie Coffin, MD  Cardiologist:  Patsy Baltimore   Chief Complaint  Patient presents with   Other    1 month f/u c/o edema legs and feet. Meds reviewed verbally with pt.    History of Present Illness:    [name] is a 74 y.o. female past medical history of Medication confusion/Medication noncompliance No family to help, uses [employer] for transportation morbid obesity,  severe lower extremity edema, dating back to 4920 systolic CHF, ejection fraction 25% in 2012  , Up to EF 50% to 55% in 03/2017 sleep apnea, scheduled for repeat testing. Previous machine broke pulmonary disease,  severe hypertension,  likely secondary to medication noncompliance who presents For follow-up of her hypertension, cardiomyopathy, edema   Last office visit May 10, 2021 On that visit, weight up 15 pounds Forgets torsemide in the p.m. High fluid intake, legs are massively swollen Reports she has been back to the wound clinic for wound that is nonhealing left leg, Unna wrap in place  Has lymphedema pumps, not using them on a regular basis Unable to put compression hose on  June 22 lab work Creatinine 1.2 BUN 29 potassium 4.0 Hemoglobin 11.2 down from 12.4  On last clinic visit we recommended torsemide up to 40 twice daily until weight 225 At 225 pounds we will go back to torsemide 40 daily  On today's visit reports that her weight did go down to 225 with fluid restriction and taking her torsemide " I do not know what happened" Recorded weight now dramatically higher  Reports that she sleeps all day, wakes up at 6 PM  drinks coffee overnight, as well as sodas At least 5 cups of coffee, other beverages Unclear if she is taking her torsemide  Falling asleep in the chair on  her visit today as this is normally when she sleeps Nurses estimate weight 250 or higher Legs feel tight, extending up into her thighs  Not using her CPAP Previously received a new machine from [employer] mask  too small   EKG personally reviewed by myself on todays visit Shows sinus bradycardia rate 55 bpm nonspecific T wave abnormality    Other past medical history reviewed hospitalization July 2018 for TIA and chest pain Slurring, head hurt, H/A, felt hot She had MRI brain without acute finding Neurology consult with no clear recommendations Chest pain resolved without intervention Carotid ultrasound without significant stenosis SNF was recommended    Past Medical History:  Diagnosis Date   Abnormality of gait    Breast cancer ([place]) 02/05/2005   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    Breast cancer ([place]) 2019   right breast   CHF (congestive heart failure) ([place])    March 28, 2017 echo: EF 50-55%; 1) echo 07/2011: EF 20-25%, mild concentric hypertrophy, diffuse HK, regional WMA cannot be excluded, grade 1 DD, mitral valve with mild regurg, LA mildly dilated). 2) EF 40-45%, mild AS,                     Complication of anesthesia 2006   pt states "hard to wake up" after L breast surgery    Edema    GERD (gastroesophageal  reflux disease)    Headache    Heart murmur    Hypertension    Hypokinesis    global, EF 35-45 % from Echo.   Malignant neoplasm of breast (female), unspecified site 06/14/2018   RIGHT T1b, N0; ER/PR positive, HER-2 negative.   Neuropathy    Obesity    Pain in joint, site unspecified    Personal history of chemotherapy 2006   left breast   Personal history of radiation therapy 2019   right breast   Personal history of radiation therapy 2006   left breast   Pneumonia    Sleep apnea    Tinea pedis    Unspecified hearing loss    bilateral   Past Surgical History:   Procedure Laterality Date   BREAST BIOPSY Right 2019   11 oc-INVASIVE MAMMARY CARCINOMA, NO SPECIAL TYPE   BREAST BIOPSY Right 2019   1030 oc- neg   BREAST LUMPECTOMY  2006   left breast    CATARACT EXTRACTION     left eye   HERNIA REPAIR  05/25/2006   Incarcerated omentum in ventral hernia, Ventralight mesh, combined lap/ open with omental resection.    PARTIAL MASTECTOMY WITH NEEDLE LOCALIZATION Right 06/14/2018   Procedure: PARTIAL MASTECTOMY WITH NEEDLE LOCALIZATION;  Surgeon: Robert Bellow, MD;  Location: [facility] ORS;  Service: General;  Laterality: Right;   PORT-A-CATH REMOVAL  05/25/2006   SENTINEL NODE BIOPSY Right 06/14/2018   Procedure: SENTINEL NODE BIOPSY;  Surgeon: Robert Bellow, MD;  Location: [facility] ORS;  Service: General;  Laterality: Right;   TUBAL LIGATION       Current Meds  Medication Sig   anastrozole (ARIMIDEX) 1 MG tablet TAKE 1 TABLET BY MOUTH DAILY. PATIENT MUST MAKE AN APPT TO SEE DR RAO   aspirin EC 81 MG tablet Take 81 mg by mouth daily.   cloNIDine (CATAPRES) 0.2 MG tablet Take 1 tablet (0.2 mg total) by mouth 2 (two) times daily.   clotrimazole (LOTRIMIN) 1 % cream Apply 1 application topically 2 (two) times daily.    [employer]S CALCIUM 600 & VITAMIN D3 600-800 MG-UNIT TABS TAKE 1 TABLET BY MOUTH TWICE A DAY   diclofenac Sodium (VOLTAREN) 1 % GEL Apply 1 application topically as needed.   Incontinence Supply Disposable (BLADDER CONTROL PADS EX ABSORB) [place]ISC Apply 1 Dose topically as needed.    isosorbide mononitrate (IMDUR) 60 MG 24 hr tablet Take 1 tablet (60 mg total) by mouth 2 (two) times daily.   loratadine (CLARITIN) 10 MG tablet Take 10 mg by mouth daily.   losartan (COZAAR) 25 MG tablet Take 25 mg by mouth daily.    methocarbamol (ROBAXIN) 500 MG tablet Take 500 mg by mouth 2 (two) times daily as needed.   MIRALAX 17 GM/SCOOP powder SMARTSIG:2 Scoopful By Mouth Daily PRN   omeprazole (PRILOSEC) 40 MG capsule Take 40 mg by mouth daily.   oxybutynin  (DITROPAN) 5 MG tablet Take 5 mg by mouth 2 (two) times daily.   potassium chloride SA (K-DUR) 20 MEQ tablet Take 1 tablet (20 meq) by mouth once daily   Torsemide 40 MG TABS Take 40 mg by mouth 2 (two) times daily. Take in the am and after lunch for when weight is 240 lbs or greater. Take 40 mg daily when your weight is 225 or less   triamcinolone cream (KENALOG) 0.1 % Apply 1 application topically 2 (two) times daily. Back of legs     Allergies:   Patient has no  known allergies.   Social History   Tobacco Use   Smoking status: Former    Packs/day: 0.50    Years: 30.00    Pack years: 15.00    Types: Cigarettes    Quit date: 07/14/2000    Years since quitting: 20.9   Smokeless tobacco: Never  Vaping Use   Vaping Use: Never used  Substance Use Topics   Alcohol use: No   Drug use: Not Currently    Types: Marijuana    Comment: endorses prior marijuana use 1999     Family Hx: The patient's family history includes Breast cancer (age of onset: 55) in an other family member; Cataracts in her mother; Coronary artery disease in an other family member; Hypertension in her mother.  ROS:   Please see the history of present illness.    Review of Systems  Constitutional: Negative.   HENT: Negative.    Respiratory: Negative.    Cardiovascular:  Positive for leg swelling.  Gastrointestinal: Negative.   Musculoskeletal: Negative.   Neurological: Negative.   Psychiatric/Behavioral: Negative.    All other systems reviewed and are negative.   Labs/Other Tests and Data Reviewed:    Recent Labs: 02/22/2021: ALT 15; BUN 29; Creatinine, Ser 1.20; Hemoglobin 11.2; Platelets 273; Potassium 4.0; Sodium 139   Recent Lipid Panel Lab Results  Component Value Date/Time   CHOL 141 03/28/2017 06:39 AM   TRIG 40 03/28/2017 06:39 AM   HDL 42 03/28/2017 06:39 AM   CHOLHDL 3.4 03/28/2017 06:39 AM   LDLCALC 91 03/28/2017 06:39 AM    Wt Readings from Last 3 Encounters:  06/12/21 254 lb 2 oz (115.3  kg)  05/10/21 240 lb (108.9 kg)  01/01/21 244 lb (110.7 kg)     Exam:    Vital Signs: Vital signs may also be detailed in the HPI BP 128/60 (BP Location: Right Arm, Patient Position: Sitting, Cuff Size: Large)   Pulse 62   Ht _0  (1.753 m)   Wt 254 lb 2 oz (115.3 kg)   SpO2 97%   BMI 37.53 kg/m   Constitutional:  oriented to person, place, and time. No distress.  HENT:  Head: Grossly normal Eyes:  no discharge. No scleral icterus.  Neck: No JVD, no carotid bruits  Cardiovascular: Regular rate and rhythm, no murmurs appreciated 2+ pitting shiny edema lower extremities, into the thighs Pulmonary/Chest: Clear to auscultation bilaterally, no wheezes or rails Abdominal: Soft.  no distension.  no tenderness.  Musculoskeletal: Normal range of motion Neurological:  normal muscle tone. Coordination normal. No atrophy Skin: Skin warm and dry Psychiatric: normal affect, pleasant   ASSESSMENT & PLAN:    Chronic diastolic CHF Weight up 15 pounds from prior clinic visit now 240 High fluid intake, medication noncompliance, sleeping all day Chronic issue though very pleasant Noncompliant with her lymphedema compression pumps Unable to place TEDS or  wraps -Stressed importance on cutting back on her fluids especially coffee, iced tea -Recommended other strategies such as hard candies, ice chips, gum -Also stressed the importance of taking her torsemide 40 mg at 6 PM when she wakes up, and 40 mg at midnight -Talked about congestive heart failure, -Ideally needs to be put in the hospital, she is adamant she does not want to go -Reports that she has transportation with a bus.   -High risk of hospital admission given noncompliance, weight gain, worsening CHF symptoms.  Again the above was discussed with her May need to add metolazone though she is noncompliant  with her torsemide so likely little benefit  Morbid obesity ([place]) Unable to exercise, symptoms worse in the setting of CHF, leg  edema  SOB (shortness of breath) Stressed importance of taking her medications as above  Essential hypertension Recommended low-salt diet, torsemide as above  OSA on CPAP Current mask on new machine does not fit her face History of noncompliance  Lymphedema Stressed compliance with her lymphedema compression pumps    Total encounter time more than 25 minutes  Greater than 50% was spent in counseling and coordination of care with the patient   Signed, Ida Rogue, MD  06/12/2021 2:09 PM    [place] [facility] [id number], [place], [place] [address]

## 2021-06-14 NOTE — Progress Notes (Signed)
AAHNA, ROSSA ([id number]) Visit Report for 06/11/2021 Arrival Information Details Patient Name: [name], [name] Date of Service: 06/11/2021 12:30 PM Medical Record Number: [id number] Patient Account Number: [id number] Date of Birth/Sex: [birth year] (75 y.o. F) Treating RN: Carlene Coria Primary Care Steen Bisig: [name] Other Clinician: Referring Minola Guin: [name] Treating Azlyn Wingler/Extender: Skipper Cliche in Treatment: 20 Visit Information History Since Last Visit All ordered tests and consults were completed: No Patient Arrived: Wheel Chair Added or deleted any medications: No Arrival Time: 12:36 Any new allergies or adverse reactions: No Accompanied By: self Had a fall or experienced change in No Transfer Assistance: None activities of daily living that may affect Patient Identification Verified: Yes risk of falls: Secondary Verification Process Completed: Yes Signs or symptoms of abuse/neglect since last visito No Patient Requires Transmission-Based Precautions: No Hospitalized since last visit: No Patient Has Alerts: Yes Implantable device outside of the clinic excluding No Patient Alerts: NOT DIABETIC cellular tissue based products placed in the center since last visit: Has Dressing in Place as Prescribed: Yes Has Compression in Place as Prescribed: Yes Pain Present Now: No Electronic Signature(s) Signed: 06/14/2021 12:57:29 PM By: Carlene Coria RN Entered By: Carlene Coria on 06/11/2021 12:45:42 [name] ([id number]) -------------------------------------------------------------------------------- Clinic Level of Care Assessment Details Patient Name: [name] Date of Service: 06/11/2021 12:30 PM Medical Record Number: [id number] Patient Account Number: [id number] Date of Birth/Sex: Sep 21, 1945 (75 y.o. F) Treating RN: Carlene Coria Primary Care Philopater Mucha: [name] Other Clinician: Referring Symphani Eckstrom: [name] Treating Sayla Golonka/Extender:  Skipper Cliche in Treatment: 20 Clinic Level of Care Assessment Items TOOL 1 Quantity Score []  - Use when EandM and Procedure is performed on INITIAL visit 0 ASSESSMENTS - Nursing Assessment / Reassessment []  - General Physical Exam (combine w/ comprehensive assessment (listed just below) when performed on new 0 pt. evals) []  - 0 Comprehensive Assessment (HX, ROS, Risk Assessments, Wounds Hx, etc.) ASSESSMENTS - Wound and Skin Assessment / Reassessment []  - Dermatologic / Skin Assessment (not related to wound area) 0 ASSESSMENTS - Ostomy and/or Continence Assessment and Care []  - Incontinence Assessment and Management 0 []  - 0 Ostomy Care Assessment and Management (repouching, etc.) PROCESS - Coordination of Care []  - Simple Patient / Family Education for ongoing care 0 []  - 0 Complex (extensive) Patient / Family Education for ongoing care []  - 0 Staff obtains Programmer, systems, Records, Test Results / Process Orders []  - 0 Staff telephones HHA, Nursing Homes / Clarify orders / etc []  - 0 Routine Transfer to another Facility (non-emergent condition) []  - 0 Routine Hospital Admission (non-emergent condition) []  - 0 New Admissions / Biomedical engineer / Ordering NPWT, Apligraf, etc. []  - 0 Emergency Hospital Admission (emergent condition) PROCESS - Special Needs []  - Pediatric / Minor Patient Management 0 []  - 0 Isolation Patient Management []  - 0 Hearing / Language / Visual special needs []  - 0 Assessment of Community assistance (transportation, D/C planning, etc.) []  - 0 Additional assistance / Altered mentation []  - 0 Support Surface(s) Assessment (bed, cushion, seat, etc.) INTERVENTIONS - Miscellaneous []  - External ear exam 0 []  - 0 Patient Transfer (multiple staff / Civil Service fast streamer / Similar devices) []  - 0 Simple Staple / Suture removal (25 or less) []  - 0 Complex Staple / Suture removal (26 or more) []  - 0 Hypo/Hyperglycemic Management (do not check if billed  separately) []  - 0 Ankle / Brachial Index (ABI) - do not check if billed separately Has the patient been seen at the hospital within the  last three years: Yes Total Score: 0 Level Of Care: ____ [name] ([id number]) Electronic Signature(s) Signed: 06/14/2021 12:57:29 PM By: Carlene Coria RN Entered By: Carlene Coria on 06/11/2021 12:57:47 [name] ([id number]) -------------------------------------------------------------------------------- Lower Extremity Assessment Details Patient Name: [name] Date of Service: 06/11/2021 12:30 PM Medical Record Number: [id number] Patient Account Number: [id number] Date of Birth/Sex: 10/02/1945 (74 y.o. F) Treating RN: Carlene Coria Primary Care Thamar Holik: [name] Other Clinician: Referring Dominyk Law: [name] Treating Cortasia Screws/Extender: Skipper Cliche in Treatment: 20 Edema Assessment Assessed: [Left: No] [Right: No] [Left: Edema] [Right: :] Calf Left: Right: Point of Measurement: 27 cm From Medial Instep 39 cm Ankle Left: Right: Point of Measurement: 10 cm From Medial Instep 26 cm Vascular Assessment Pulses: Dorsalis Pedis Palpable: [Left:Yes] Electronic Signature(s) Signed: 06/14/2021 12:57:29 PM By: Carlene Coria RN Entered By: Carlene Coria on 06/11/2021 12:48:12 [name] ([id number]) -------------------------------------------------------------------------------- Multi Wound Chart Details Patient Name: [name] Date of Service: 06/11/2021 12:30 PM Medical Record Number: [id number] Patient Account Number: [id number] Date of Birth/Sex: 27-Dec-1945 (75 y.o. F) Treating RN: Carlene Coria Primary Care Romie Tay: [name] Other Clinician: Referring Omid Deardorff: [name] Treating Chantille Navarrete/Extender: Skipper Cliche in Treatment: 20 Vital Signs Height(in): 69 Pulse(bpm): 64 Weight(lbs): 244 Blood Pressure(mmHg): 122/56 Body Mass Index(BMI): 36 Temperature(F): 98.7 Respiratory  Rate(breaths/min): 18 Photos: [N/A:N/A] Wound Location: Left, Lateral Lower Leg N/A N/A Wounding Event: Gradually Appeared N/A N/A Primary Etiology: Venous Leg Ulcer N/A N/A Comorbid History: Cataracts, Lymphedema, Sleep N/A N/A Apnea, Congestive Heart Failure, Hypertension, Osteoarthritis, Neuropathy, Received Chemotherapy, Received Radiation Date Acquired: 09/08/2020 N/A N/A Weeks of Treatment: 20 N/A N/A Wound Status: Open N/A N/A Measurements L x W x D (cm) 2x0.7x0.1 N/A N/A Area (cm) : 1.1 N/A N/A Volume (cm) : 0.11 N/A N/A % Reduction in Area: 76.70% N/A N/A % Reduction in Volume: 76.60% N/A N/A Classification: Full Thickness Without Exposed N/A N/A Support Structures Exudate Amount: Medium N/A N/A Exudate Type: Serosanguineous N/A N/A Exudate Color: red, brown N/A N/A Granulation Amount: Large (67-100%) N/A N/A Granulation Quality: Red N/A N/A Necrotic Amount: None Present (0%) N/A N/A Exposed Structures: Fat Layer (Subcutaneous Tissue): N/A N/A Yes Fascia: No Tendon: No Muscle: No Joint: No Bone: No Epithelialization: Small (1-33%) N/A N/A Treatment Notes Electronic Signature(s) Signed: 06/14/2021 12:57:29 PM By: Carlene Coria RN Entered By: Carlene Coria on 06/11/2021 12:55:35 [name] ([id number]) -------------------------------------------------------------------------------- Multi-Disciplinary Care Plan Details Patient Name: [name] Date of Service: 06/11/2021 12:30 PM Medical Record Number: [id number] Patient Account Number: [id number] Date of Birth/Sex: 07-02-1946 (75 y.o. F) Treating RN: Carlene Coria Primary Care Brodi Nery: [name] Other Clinician: Referring Gwendolyne Welford: [name] Treating Karrisa Didio/Extender: Skipper Cliche in Treatment: 20 Active Inactive Wound/Skin Impairment Nursing Diagnoses: Impaired tissue integrity Goals: Patient/caregiver will verbalize understanding of skin care regimen Date Initiated: 01/16/2021 Date  Inactivated: 02/18/2021 Target Resolution Date: 02/16/2021 Goal Status: Met Ulcer/skin breakdown will have a volume reduction of 30% by week 4 Date Initiated: 01/16/2021 Date Inactivated: 02/18/2021 Target Resolution Date: 02/16/2021 Goal Status: Met Ulcer/skin breakdown will have a volume reduction of 50% by week 8 Date Initiated: 01/16/2021 Date Inactivated: 04/01/2021 Target Resolution Date: 03/18/2021 Goal Status: Unmet Unmet Reason: comorbities Ulcer/skin breakdown will have a volume reduction of 80% by week 12 Date Initiated: 04/01/2021 Date Inactivated: 04/23/2021 Target Resolution Date: 04/18/2021 Goal Status: Unmet Unmet Reason: comorbities Ulcer/skin breakdown will heal within 14 weeks Date Initiated: 04/01/2021 Target Resolution Date: 05/19/2021 Goal Status: Active Interventions: Assess ulceration(s) every visit Treatment Activities: Topical wound management  initiated : 01/16/2021 Notes: Electronic Signature(s) Signed: 06/14/2021 12:57:29 PM By: Carlene Coria RN Entered By: Carlene Coria on 06/11/2021 12:55:20 [name] ([id number]) -------------------------------------------------------------------------------- Pain Assessment Details Patient Name: [name] Date of Service: 06/11/2021 12:30 PM Medical Record Number: [id number] Patient Account Number: [id number] Date of Birth/Sex: 1946/03/05 (75 y.o. F) Treating RN: Carlene Coria Primary Care Lashell Moffitt: [name] Other Clinician: Referring Chayce Robbins: [name] Treating Latrese Carolan/Extender: Skipper Cliche in Treatment: 20 Active Problems Location of Pain Severity and Description of Pain Patient Has Paino No Site Locations Pain Management and Medication Current Pain Management: Electronic Signature(s) Signed: 06/14/2021 12:57:29 PM By: Carlene Coria RN Entered By: Carlene Coria on 06/11/2021 12:46:24 [name]  ([id number]) -------------------------------------------------------------------------------- Patient/Caregiver Education Details Patient Name: [name] Date of Service: 06/11/2021 12:30 PM Medical Record Number: [id number] Patient Account Number: [id number] Date of Birth/Gender: July 08, 1946 (75 y.o. F) Treating RN: Carlene Coria Primary Care Physician: [name] Other Clinician: Referring Physician: [name] Treating Physician/Extender: Skipper Cliche in Treatment: 20 Education Assessment Education Provided To: Patient Education Topics Provided Wound/Skin Impairment: Methods: Explain/Verbal Responses: State content correctly Electronic Signature(s) Signed: 06/14/2021 12:57:29 PM By: Carlene Coria RN Entered By: Carlene Coria on 06/11/2021 12:58:05 [name] ([id number]) -------------------------------------------------------------------------------- Wound Assessment Details Patient Name: [name] Date of Service: 06/11/2021 12:30 PM Medical Record Number: [id number] Patient Account Number: [id number] Date of Birth/Sex: 1946/03/02 (75 y.o. F) Treating RN: Carlene Coria Primary Care Helena Sardo: [name] Other Clinician: Referring Keniyah Gelinas: [name] Treating Nargis Abrams/Extender: Skipper Cliche in Treatment: 20 Wound Status Wound Number: 2 Primary Venous Leg Ulcer Etiology: Wound Location: Left, Lateral Lower Leg Wound Open Wounding Event: Gradually Appeared Status: Date Acquired: 09/08/2020 Comorbid Cataracts, Lymphedema, Sleep Apnea, Congestive Heart Weeks Of Treatment: 20 History: Failure, Hypertension, Osteoarthritis, Neuropathy, Clustered Wound: No Received Chemotherapy, Received Radiation Photos Wound Measurements Length: (cm) 2 Width: (cm) 0.7 Depth: (cm) 0.1 Area: (cm) 1.1 Volume: (cm) 0.11 % Reduction in Area: 76.7% % Reduction in Volume: 76.6% Epithelialization: Small (1-33%) Tunneling: No Undermining: No Wound  Description Classification: Full Thickness Without Exposed Support Structu Exudate Amount: Medium Exudate Type: Serosanguineous Exudate Color: red, brown res Wound Bed Granulation Amount: Large (67-100%) Exposed Structure Granulation Quality: Red Fascia Exposed: No Necrotic Amount: None Present (0%) Fat Layer (Subcutaneous Tissue) Exposed: Yes Tendon Exposed: No Muscle Exposed: No Joint Exposed: No Bone Exposed: No Electronic Signature(s) Signed: 06/14/2021 12:57:29 PM By: Carlene Coria RN Entered By: Carlene Coria on 06/11/2021 12:47:34 [name] ([id number]) -------------------------------------------------------------------------------- Vitals Details Patient Name: [name] Date of Service: 06/11/2021 12:30 PM Medical Record Number: [id number] Patient Account Number: [id number] Date of Birth/Sex: Jul 27, 1946 (75 y.o. F) Treating RN: Carlene Coria Primary Care Buddy Loeffelholz: [name] Other Clinician: Referring Odus Clasby: [name] Treating Deaire Mcwhirter/Extender: Skipper Cliche in Treatment: 20 Vital Signs Time Taken: 12:45 Temperature (F): 98.7 Height (in): 69 Pulse (bpm): 64 Weight (lbs): 244 Respiratory Rate (breaths/min): 18 Body Mass Index (BMI): 36 Blood Pressure (mmHg): 122/56 Reference Range: 80 - 120 mg / dl Electronic Signature(s) Signed: 06/14/2021 12:57:29 PM By: Carlene Coria RN Entered By: Carlene Coria on 06/11/2021 12:46:11

## 2021-06-14 NOTE — Progress Notes (Signed)
TORRA, PALA ([id number]) Visit Report for 06/11/2021 Chief Complaint Document Details Patient Name: [name], [name] Date of Service: 06/11/2021 12:30 PM Medical Record Number: [id number] Patient Account Number: [id number] Date of Birth/Sex: [birth year] (75 y.o. F) Treating RN: Carlene Coria Primary Care Provider: [name] Other Clinician: Referring Provider: [name] Treating Provider/Extender: Skipper Cliche in Treatment: 20 Information Obtained from: Patient Chief Complaint Left LE Ulcer Electronic Signature(s) Signed: 06/11/2021 1:25:58 PM By: Worthy Keeler PA-C Entered By: Worthy Keeler on 06/11/2021 13:25:57 [name] ([id number]) -------------------------------------------------------------------------------- Debridement Details Patient Name: [name] Date of Service: 06/11/2021 12:30 PM Medical Record Number: [id number] Patient Account Number: [id number] Date of Birth/Sex: June 09, 1946 (75 y.o. F) Treating RN: Carlene Coria Primary Care Provider: [name] Other Clinician: Referring Provider: [name] Treating Provider/Extender: Skipper Cliche in Treatment: 20 Debridement Performed for Wound #2 Left,Lateral Lower Leg Assessment: Performed By: Physician Tommie Sams., PA-C Debridement Type: Debridement Severity of Tissue Pre Debridement: Fat layer exposed Level of Consciousness (Pre- Awake and Alert procedure): Pre-procedure Verification/Time Out Yes - 12:52 Taken: Start Time: 12:52 Pain Control: Lidocaine 4% Topical Solution Total Area Debrided (L x W): 2 (cm) x 0.7 (cm) = 1.4 (cm) Tissue and other material Viable, Non-Viable, Slough, Subcutaneous, Skin: Dermis , Skin: Epidermis, Slough debrided: Level: Skin/Subcutaneous Tissue Debridement Description: Excisional Instrument: Curette Bleeding: Minimum Hemostasis Achieved: Pressure End Time: 12:56 Procedural Pain: 0 Post Procedural Pain: 0 Response to Treatment:  Procedure was tolerated well Level of Consciousness (Post- Awake and Alert procedure): Post Debridement Measurements of Total Wound Length: (cm) 2 Width: (cm) 0.7 Depth: (cm) 0.1 Volume: (cm) 0.11 Character of Wound/Ulcer Post Debridement: Improved Severity of Tissue Post Debridement: Fat layer exposed Post Procedure Diagnosis Same as Pre-procedure Electronic Signature(s) Signed: 06/11/2021 5:40:02 PM By: Worthy Keeler PA-C Signed: 06/14/2021 12:57:29 PM By: Carlene Coria RN Entered By: Carlene Coria on 06/11/2021 12:56:46 [name] ([id number]) -------------------------------------------------------------------------------- HPI Details Patient Name: [name] Date of Service: 06/11/2021 12:30 PM Medical Record Number: [id number] Patient Account Number: [id number] Date of Birth/Sex: [birth year] (75 y.o. F) Treating RN: Carlene Coria Primary Care Provider: [name] Other Clinician: Referring Provider: [name] Treating Provider/Extender: Skipper Cliche in Treatment: 20 History of Present Illness HPI Description: 03/15/2020 upon evaluation today patient presents today for initial evaluation here in our clinic concerning a wound that is on the left posterior lower extremity. Unfortunately this has been giving the patient some discomfort at this point which she notes has been affected her pretty much daily. The patient does have a history of venous insufficiency, hypertension, congestive heart failure, and a history of breast cancer. Fortunately there does not appear to be evidence of active infection at this time which is great news. No fevers, chills, nausea, vomiting, or diarrhea. Wound is not extremely large but does have some slough covering the surface of the wound. This is going require sharp debridement today. 03/15/2020 upon evaluation today patient appears to be doing fairly well in regard to her wound. She does have some slough noted on the surface of the  wound currently but I do believe the Iodoflex has been beneficial over the past week. There is no signs of active infection at this time which is great news and overall very pleased with the progress. No fevers, chills, nausea, vomiting, or diarrhea. 04/02/2020 upon evaluation today patient appears to be doing a little better in regard to her wound size wise this is not tremendously smaller she does have some slough buildup on  the surface of the wound. With that being said this is going require some sharp debridement today to clear away some of the necrotic debris. Fortunately there is no evidence of active infection at this time. No fevers, chills, nausea, vomiting, or diarrhea. 04/10/2020 on evaluation today patient appears to be doing well with regard to her ulcer which is measuring somewhat smaller today. She actually has more granulation tissue noted which is also good news is no need for sharp debridement today. Fortunately there is no evidence of infection either which is also excellent. No fevers, chills, nausea, vomiting, or diarrhea. 04/26/2020 on evaluation today patient's wound actually is showing signs of good improvement which is great news. There does not appear to be any evidence of active infection. I do believe she is tolerating the collagen at this point. 05/03/2020 upon evaluation today patient's wound actually showed signs of good granulation at this time there does not appear to be any evidence of active infection which is great news and overall very pleased with where things stand. With that being said I do believe that the patient is can require some sharp debridement today but fortunately nothing too significant. 05/11/20 on evaluation today patient appears to be doing well in regard to her leg ulcer. She's been tolerating the dressing changes without complication. Fortunately there is no signs of active infection at this time. Overall I'm very pleased with where things  stand. 05/18/2020 upon evaluation today patient's wound is showing signs of improvement is very dry and I think the collagen for the most part just dried to the wound bed. I am going to clean this away with sharp debridement in order to get this under control in my opinion. 05/25/2020 upon evaluation today patient actually appears to be doing well in regard to her leg ulcer. In fact upon inspection it appears she could potentially be completely healed but that is not guaranteed based on what we are seeing. There does not appear to be any signs of active infection which is great news. 05/31/2020 on evaluation today patient appears to be doing well with regard to her wounds. She in fact appears to be almost completely healed on the left leg there is just a very small opening remaining point 1 06/08/2020 upon evaluation today patient appears to be doing excellent in regard to her leg ulcer on the left in fact it appears to be that she is completely healed as of today. I see no signs of active infection at this time which is great news. No fevers, chills, nausea, vomiting, or diarrhea. READMISSION 01/16/2021 Patient that we have had in this clinic with a wound on the left posterior calf in the summer 2021 extending into October. She was felt to have chronic venous insufficiency. Per the patient's description she was discharged with what sounds like external compression stockings although she could not afford the "$75 per leg". She therefore did not get anything. Apparently her wound that she has currently is been in existence since January. She has been followed at vein and vascular with Unna boots and I think calcium alginate. She had ABIs done on 06/25/2020 that showed an noncompressible ABI on the right leg and the left leg but triphasic waveforms with good great toe pressures and a TBI of 0.90 on the right and 0.91 on the left Surprisingly the patient also had venous reflux studies that were really  unremarkable. This included no evidence of DVTs or SVTs but there was no evidence of deep  venous insufficiency or superficial venous insufficiency in the greater saphenous or short saphenous veins bilaterally. I would wonder about more central venous issues or perhaps this is all lymphedema 01/25/2021 upon evaluation today patient appears to be doing decently well and guard to her wound. The good news is the Iodoflex that is job she saw Dr. Dellia Nims last week for readmission and to be honest I think that this has done extremely well over that week. I do believe that she can tolerate the sharp debridement today which will be good. I think that cleaning off the wound will likely allow [place] to be able to use a different type of dressing I do not think the Iodoflex will even be necessary based on how clean the wound looks. Fortunately there is no sign of active infection at this time which is great news. No fevers, chills, nausea, vomiting, or diarrhea. 02/12/2021 upon evaluation today patient appears to be doing well with regard to her leg ulcer. We did switch to alginate when she came for nurse visit I think is doing much better for her. Fortunately there does not appear to be any signs of active infection at this time which is great news. No fevers, chills, nausea, vomiting, or diarrhea. [name], [name] ([id number]) 02/18/2021 upon evaluation today patient's wound is actually showing signs of good improvement. I am very pleased with where things stand currently. I do not see any signs of active infection which is great news and overall I feel like she is making good progress. The patient likewise is happy that things are doing so well and that this is measuring somewhat smaller. 02/25/2021 upon evaluation today patient actually appears to be doing quite well in regard to her wounds. She has been tolerating the dressing changes without complication. Fortunately there does not appear to be any signs of active  infection which is great news and overall I am extremely pleased with where things stand. No fevers, chills, nausea, vomiting, or diarrhea. She does have a lot of edema I really do think she needs compression socks to be worn in order to prevent things from causing additional issues for her currently. Especially on the right leg she should be wearing compression socks all the time to be honest. 03/04/2021 upon evaluation today patient's leg ulcer actually appears to be doing pretty well which is great news. There does not appear to be any significant change but overall the appearance is better even though the size is not necessarily reflecting a great improvement. Fortunately there does not appear to be any signs of active infection. No fevers, chills, nausea, vomiting, or diarrhea. 03/12/2021 upon evaluation today patient appears to be doing well with regard to her wounds. She has been tolerating the dressing changes without complication. The good news is that the wound on her left lateral leg is doing much better and is showing signs of good improvement. Overall her swelling is controlled well as well. She does need some compression socks she plans to go Jefferey socks next week. 03/18/2021 on evaluation today patient's wound actually is showing signs of good improvement. She does have a little bit of slough this can require some sharp debridement today. 03/25/2021 upon evaluation today patient appears to be doing well with regard to her wound on the left lateral leg. She has been tolerating the dressing changes without complication. Fortunately there does not appear to be any signs of active infection at this time. No fever chills no 04/01/2021 upon evaluation today patient's wound  is showing signs of improvement and overall very pleased with where things stand at this point. There is no evidence of active infection which is great news as well and in general I think that she is making great  progress. 04/09/2021 upon evaluation today patient appears to be doing well with regard to her ankle ulcer. She is making good progress currently which is great news. There does not appear to be any signs of infection also excellent news. In general I am extremely pleased with where things stand today. No fevers, chills, nausea, vomiting, or diarrhea. 04/23/2021 upon inspection today patient appears to be doing quite well with regard to her leg ulcer. She has been tolerating the dressing changes without complication. She is can require some sharp debridement today but overall seems to be doing excellent. 05/06/2021 upon evaluation today patient unfortunately appears to be doing poorly overall in regard to her swelling. She is actually significantly more swollen than last week despite the fact that her wound is doing better this is not a good trend. I think that she probably needs to see her cardiologist ASAP and I discussed that with her today. This includes the fact that honestly I think she probably is getting need an increase in her diuretic or something to try to clear away some of the excess fluid that she is experiencing at the moment. She is not been doing her pumps even twice a day but again right now more concerned that if she were to do the pump she would fluid overload her lungs causing other issues as far as her breathing is concerned. Obviously I think she needs to contact them and move up the appointment for the 12th of something sooner 2 weeks is a bit too long to wait with how swollen she has. 05/14/2021 upon evaluation today patient's wound actually showing signs of being a little bit smaller compared to previous. She has been making good progress however. Fortunately there does not appear to be any evidence of active infection which is great. Overall I am extremely pleased in that regard. Nonetheless I am still concerned that she is having quite a bit of issue as far as the swelling is  concerned she has been placed on fluid restriction as well as an increase in the furosemide by her doctor. We will see how things progress. 05/21/2021 upon evaluation today patient appears to be doing well with regard to her wound in general. She has been tolerating the dressing changes without complication. With that being said she has been using silver cell for a while and while this was showing signs of improvement it is now gotten to the point where this has slowed down quite significantly. I do believe that switching to a different dressing may be beneficial for her today. 05/28/2021 upon evaluation today patient actually appears to be doing quite well in regard to her wound. In fact there is really not any need for sharp debridement today based on what I see. The Hydrofera Blue is doing great and overall I think that she is managing quite nicely with the compression wrapping. Her edema is still down quite a bit with the wrapping in place. 06/11/2021 upon evaluation today patient appears to be doing well with regard to her leg ulcer. She has been tolerating the dressing changes without complication. Fortunately there does not appear to be any signs of active infection at this time which is great news. No fevers, chills, nausea, vomiting, or diarrhea. She is going  require some sharp debridement today. Electronic Signature(s) Signed: 06/11/2021 1:26:04 PM By: Worthy Keeler PA-C Entered By: Worthy Keeler on 06/11/2021 13:26:03 [name], [name] ([id number]) -------------------------------------------------------------------------------- Physical Exam Details Patient Name: [name] Date of Service: 06/11/2021 12:30 PM Medical Record Number: [id number] Patient Account Number: [id number] Date of Birth/Sex: 09-05-1946 (75 y.o. F) Treating RN: Carlene Coria Primary Care Provider: [name] Other Clinician: Referring Provider: [name] Treating Provider/Extender: Skipper Cliche in  Treatment: 12 Constitutional Well-nourished and well-hydrated in no acute distress. Respiratory normal breathing without difficulty. Psychiatric this patient is able to make decisions and demonstrates good insight into disease process. Alert and Oriented x 3. pleasant and cooperative. Notes Upon inspection patient's wound bed showed signs of good granulation epithelization at this point. Fortunately there does not appear to be any evidence of active infection which is great news and overall I am extremely pleased with where we stand today. No fevers, chills, nausea, vomiting, or diarrhea. Electronic Signature(s) Signed: 06/11/2021 1:26:20 PM By: Worthy Keeler PA-C Entered By: Worthy Keeler on 06/11/2021 13:26:20 [name] ([id number]) -------------------------------------------------------------------------------- Physician Orders Details Patient Name: [name] Date of Service: 06/11/2021 12:30 PM Medical Record Number: [id number] Patient Account Number: [id number] Date of Birth/Sex: 03/31/1946 (75 y.o. F) Treating RN: Carlene Coria Primary Care Provider: [name] Other Clinician: Referring Provider: [name] Treating Provider/Extender: Skipper Cliche in Treatment: 20 Verbal / Phone Orders: No Diagnosis Coding Follow-up Appointments o Return Appointment in 1 week. Bathing/ Shower/ Hygiene o May shower with wound dressing protected with water repellent cover or cast protector. Edema Control - Lymphedema / Segmental Compressive Device / Other Left Lower Extremity o Optional: One layer of unna paste to top of compression wrap (to act as an anchor). o Tubigrip single layer applied. - RIGHT LEG tubi grip applied "E" until you get your compression sock use this and it's hand washable o Patient to wear own compression stockings. Remove compression stockings every night before going to bed and put on every morning when getting up. - on RIGHT LEG call  [employer] for compression sock 15-22 mm/Hg, measurements and phone number on blue sheet [employer] on [address] in [place] also has the compression socks o Elevate, Exercise Daily and Avoid Standing for Long Periods of Time. o Elevate legs to the level of the heart and pump ankles as often as possible o Elevate leg(s) parallel to the floor when sitting. o Compression Pump: Use compression pump on left lower extremity for 60 minutes, twice daily. - You may pump over wraps o Compression Pump: Use compression pump on right lower extremity for 60 minutes, twice daily. - You may pump over wraps Additional Orders / Instructions Wound #2 Left,Lateral Lower Leg o Follow Nutritious Diet and Increase Protein Intake - Increase protein intake. Wound Treatment Wound #2 - Lower Leg Wound Laterality: Left, Lateral Cleanser: Soap and Water 1 x Per Week/30 Days Discharge Instructions: Gently cleanse wound with antibacterial soap, rinse and pat dry prior to dressing wounds Primary Dressing: Hydrofera Blue Ready Transfer Foam, 2.5x2.5 (in/in) 1 x Per Week/30 Days Discharge Instructions: Apply Hydrofera Blue Ready to wound bed as directed Secondary Dressing: ABD Pad 5x9 (in/in) 1 x Per Week/30 Days Discharge Instructions: Cover with ABD pad Compression Wrap: Profore Lite LF 3 Multilayer Compression Bandaging System 1 x Per Week/30 Days Discharge Instructions: Apply 3 multi-layer wrap as prescribed. Electronic Signature(s) Signed: 06/11/2021 5:40:02 PM By: Worthy Keeler PA-C Signed: 06/14/2021 12:57:29 PM By: Carlene Coria  RN Entered By: Carlene Coria on 06/11/2021 12:57:37 [name] ([id number]) -------------------------------------------------------------------------------- Problem List Details Patient Name: [name], [name] Date of Service: 06/11/2021 12:30 PM Medical Record Number: [id number] Patient Account Number: [id number] Date of Birth/Sex: 1945/11/24 (75 y.o.  F) Treating RN: Carlene Coria Primary Care Provider: [name] Other Clinician: Referring Provider: [name] Treating Provider/Extender: Skipper Cliche in Treatment: 20 Active Problems ICD-10 Encounter Code Description Active Date MDM Diagnosis I89.0 Lymphedema, not elsewhere classified 01/16/2021 No Yes I87.332 Chronic venous hypertension (idiopathic) with ulcer and inflammation of 01/16/2021 No Yes left lower extremity L97.828 Non-pressure chronic ulcer of other part of left lower leg with other 01/16/2021 No Yes specified severity Inactive Problems Resolved Problems Electronic Signature(s) Signed: 06/11/2021 1:25:47 PM By: Worthy Keeler PA-C Entered By: Worthy Keeler on 06/11/2021 13:25:47 [name] ([id number]) -------------------------------------------------------------------------------- Progress Note Details Patient Name: [name] Date of Service: 06/11/2021 12:30 PM Medical Record Number: [id number] Patient Account Number: [id number] Date of Birth/Sex: 01/15/1946 (75 y.o. F) Treating RN: Carlene Coria Primary Care Provider: [name] Other Clinician: Referring Provider: [name] Treating Provider/Extender: Skipper Cliche in Treatment: 20 Subjective Chief Complaint Information obtained from Patient Left LE Ulcer History of Present Illness (HPI) 03/15/2020 upon evaluation today patient presents today for initial evaluation here in our clinic concerning a wound that is on the left posterior lower extremity. Unfortunately this has been giving the patient some discomfort at this point which she notes has been affected her pretty much daily. The patient does have a history of venous insufficiency, hypertension, congestive heart failure, and a history of breast cancer. Fortunately there does not appear to be evidence of active infection at this time which is great news. No fevers, chills, nausea, vomiting, or diarrhea. Wound is not extremely  large but does have some slough covering the surface of the wound. This is going require sharp debridement today. 03/15/2020 upon evaluation today patient appears to be doing fairly well in regard to her wound. She does have some slough noted on the surface of the wound currently but I do believe the Iodoflex has been beneficial over the past week. There is no signs of active infection at this time which is great news and overall very pleased with the progress. No fevers, chills, nausea, vomiting, or diarrhea. 04/02/2020 upon evaluation today patient appears to be doing a little better in regard to her wound size wise this is not tremendously smaller she does have some slough buildup on the surface of the wound. With that being said this is going require some sharp debridement today to clear away some of the necrotic debris. Fortunately there is no evidence of active infection at this time. No fevers, chills, nausea, vomiting, or diarrhea. 04/10/2020 on evaluation today patient appears to be doing well with regard to her ulcer which is measuring somewhat smaller today. She actually has more granulation tissue noted which is also good news is no need for sharp debridement today. Fortunately there is no evidence of infection either which is also excellent. No fevers, chills, nausea, vomiting, or diarrhea. 04/26/2020 on evaluation today patient's wound actually is showing signs of good improvement which is great news. There does not appear to be any evidence of active infection. I do believe she is tolerating the collagen at this point. 05/03/2020 upon evaluation today patient's wound actually showed signs of good granulation at this time there does not appear to be any evidence of active infection which is great news and overall very pleased  with where things stand. With that being said I do believe that the patient is can require some sharp debridement today but fortunately nothing too significant. 05/11/20 on  evaluation today patient appears to be doing well in regard to her leg ulcer. She's been tolerating the dressing changes without complication. Fortunately there is no signs of active infection at this time. Overall I'm very pleased with where things stand. 05/18/2020 upon evaluation today patient's wound is showing signs of improvement is very dry and I think the collagen for the most part just dried to the wound bed. I am going to clean this away with sharp debridement in order to get this under control in my opinion. 05/25/2020 upon evaluation today patient actually appears to be doing well in regard to her leg ulcer. In fact upon inspection it appears she could potentially be completely healed but that is not guaranteed based on what we are seeing. There does not appear to be any signs of active infection which is great news. 05/31/2020 on evaluation today patient appears to be doing well with regard to her wounds. She in fact appears to be almost completely healed on the left leg there is just a very small opening remaining point 1 06/08/2020 upon evaluation today patient appears to be doing excellent in regard to her leg ulcer on the left in fact it appears to be that she is completely healed as of today. I see no signs of active infection at this time which is great news. No fevers, chills, nausea, vomiting, or diarrhea. READMISSION 01/16/2021 Patient that we have had in this clinic with a wound on the left posterior calf in the summer 2021 extending into October. She was felt to have chronic venous insufficiency. Per the patient's description she was discharged with what sounds like external compression stockings although she could not afford the "$75 per leg". She therefore did not get anything. Apparently her wound that she has currently is been in existence since January. She has been followed at vein and vascular with Unna boots and I think calcium alginate. She had ABIs done on 06/25/2020 that  showed an noncompressible ABI on the right leg and the left leg but triphasic waveforms with good great toe pressures and a TBI of 0.90 on the right and 0.91 on the left Surprisingly the patient also had venous reflux studies that were really unremarkable. This included no evidence of DVTs or SVTs but there was no evidence of deep venous insufficiency or superficial venous insufficiency in the greater saphenous or short saphenous veins bilaterally. I would wonder about more central venous issues or perhaps this is all lymphedema 01/25/2021 upon evaluation today patient appears to be doing decently well and guard to her wound. The good news is the Iodoflex that is job she saw Dr. Dellia Nims last week for readmission and to be honest I think that this has done extremely well over that week. I do believe that she can tolerate the sharp debridement today which will be good. I think that cleaning off the wound will likely allow [place] to be able to use a different type of dressing I do not think the Iodoflex will even be necessary based on how clean the wound looks. Fortunately there is no sign of active infection at this time which is great news. No fevers, chills, nausea, vomiting, or diarrhea. [name], [name] ([id number]) 02/12/2021 upon evaluation today patient appears to be doing well with regard to her leg ulcer. We did switch to  alginate when she came for nurse visit I think is doing much better for her. Fortunately there does not appear to be any signs of active infection at this time which is great news. No fevers, chills, nausea, vomiting, or diarrhea. 02/18/2021 upon evaluation today patient's wound is actually showing signs of good improvement. I am very pleased with where things stand currently. I do not see any signs of active infection which is great news and overall I feel like she is making good progress. The patient likewise is happy that things are doing so well and that this is measuring  somewhat smaller. 02/25/2021 upon evaluation today patient actually appears to be doing quite well in regard to her wounds. She has been tolerating the dressing changes without complication. Fortunately there does not appear to be any signs of active infection which is great news and overall I am extremely pleased with where things stand. No fevers, chills, nausea, vomiting, or diarrhea. She does have a lot of edema I really do think she needs compression socks to be worn in order to prevent things from causing additional issues for her currently. Especially on the right leg she should be wearing compression socks all the time to be honest. 03/04/2021 upon evaluation today patient's leg ulcer actually appears to be doing pretty well which is great news. There does not appear to be any significant change but overall the appearance is better even though the size is not necessarily reflecting a great improvement. Fortunately there does not appear to be any signs of active infection. No fevers, chills, nausea, vomiting, or diarrhea. 03/12/2021 upon evaluation today patient appears to be doing well with regard to her wounds. She has been tolerating the dressing changes without complication. The good news is that the wound on her left lateral leg is doing much better and is showing signs of good improvement. Overall her swelling is controlled well as well. She does need some compression socks she plans to go Jefferey socks next week. 03/18/2021 on evaluation today patient's wound actually is showing signs of good improvement. She does have a little bit of slough this can require some sharp debridement today. 03/25/2021 upon evaluation today patient appears to be doing well with regard to her wound on the left lateral leg. She has been tolerating the dressing changes without complication. Fortunately there does not appear to be any signs of active infection at this time. No fever chills no 04/01/2021 upon  evaluation today patient's wound is showing signs of improvement and overall very pleased with where things stand at this point. There is no evidence of active infection which is great news as well and in general I think that she is making great progress. 04/09/2021 upon evaluation today patient appears to be doing well with regard to her ankle ulcer. She is making good progress currently which is great news. There does not appear to be any signs of infection also excellent news. In general I am extremely pleased with where things stand today. No fevers, chills, nausea, vomiting, or diarrhea. 04/23/2021 upon inspection today patient appears to be doing quite well with regard to her leg ulcer. She has been tolerating the dressing changes without complication. She is can require some sharp debridement today but overall seems to be doing excellent. 05/06/2021 upon evaluation today patient unfortunately appears to be doing poorly overall in regard to her swelling. She is actually significantly more swollen than last week despite the fact that her wound is doing better this  is not a good trend. I think that she probably needs to see her cardiologist ASAP and I discussed that with her today. This includes the fact that honestly I think she probably is getting need an increase in her diuretic or something to try to clear away some of the excess fluid that she is experiencing at the moment. She is not been doing her pumps even twice a day but again right now more concerned that if she were to do the pump she would fluid overload her lungs causing other issues as far as her breathing is concerned. Obviously I think she needs to contact them and move up the appointment for the 12th of something sooner 2 weeks is a bit too long to wait with how swollen she has. 05/14/2021 upon evaluation today patient's wound actually showing signs of being a little bit smaller compared to previous. She has been making good progress  however. Fortunately there does not appear to be any evidence of active infection which is great. Overall I am extremely pleased in that regard. Nonetheless I am still concerned that she is having quite a bit of issue as far as the swelling is concerned she has been placed on fluid restriction as well as an increase in the furosemide by her doctor. We will see how things progress. 05/21/2021 upon evaluation today patient appears to be doing well with regard to her wound in general. She has been tolerating the dressing changes without complication. With that being said she has been using silver cell for a while and while this was showing signs of improvement it is now gotten to the point where this has slowed down quite significantly. I do believe that switching to a different dressing may be beneficial for her today. 05/28/2021 upon evaluation today patient actually appears to be doing quite well in regard to her wound. In fact there is really not any need for sharp debridement today based on what I see. The Hydrofera Blue is doing great and overall I think that she is managing quite nicely with the compression wrapping. Her edema is still down quite a bit with the wrapping in place. 06/11/2021 upon evaluation today patient appears to be doing well with regard to her leg ulcer. She has been tolerating the dressing changes without complication. Fortunately there does not appear to be any signs of active infection at this time which is great news. No fevers, chills, nausea, vomiting, or diarrhea. She is going require some sharp debridement today. Objective Constitutional Well-nourished and well-hydrated in no acute distress. [place], Hollyn ([id number]) Vitals Time Taken: 12:45 PM, Height: 69 in, Weight: 244 lbs, BMI: 36, Temperature: 98.7 F, Pulse: 64 bpm, Respiratory Rate: 18 breaths/min, Blood Pressure: 122/56 mmHg. Respiratory normal breathing without difficulty. Psychiatric this patient is  able to make decisions and demonstrates good insight into disease process. Alert and Oriented x 3. pleasant and cooperative. General Notes: Upon inspection patient's wound bed showed signs of good granulation epithelization at this point. Fortunately there does not appear to be any evidence of active infection which is great news and overall I am extremely pleased with where we stand today. No fevers, chills, nausea, vomiting, or diarrhea. Integumentary (Hair, Skin) Wound #2 status is Open. Original cause of wound was Gradually Appeared. The date acquired was: 09/08/2020. The wound has been in treatment 20 weeks. The wound is located on the Left,Lateral Lower Leg. The wound measures 2cm length x 0.7cm width x 0.1cm depth; 1.1cm^2 area and 0.11cm^3  volume. There is Fat Layer (Subcutaneous Tissue) exposed. There is no tunneling or undermining noted. There is a medium amount of serosanguineous drainage noted. There is large (67-100%) red granulation within the wound bed. There is no necrotic tissue within the wound bed. 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 There was a Excisional Skin/Subcutaneous Tissue Debridement with a total area of 1.4 sq cm performed by Tommie Sams., PA-C. With the following instrument(s): Curette to remove Viable and Non-Viable tissue/material. Material removed includes Subcutaneous Tissue, Slough, Skin: Dermis, and Skin: Epidermis after achieving pain control using Lidocaine 4% Topical Solution. No specimens were taken. A time out was conducted at 12:52, prior to the start of the procedure.  A Minimum amount of bleeding was controlled with Pressure. The procedure was tolerated well with a pain level of 0 throughout and a pain level of 0 following the procedure. Post Debridement Measurements: 2cm length x 0.7cm width x 0.1cm depth; 0.11cm^3 volume. Character of Wound/Ulcer Post Debridement is improved. Severity of Tissue Post Debridement is: Fat layer exposed. Post procedure Diagnosis Wound #2: Same as Pre-Procedure Plan Follow-up Appointments: Return Appointment in 1 week. Bathing/ Shower/ Hygiene: May shower with wound dressing protected with water repellent cover or cast protector. Edema Control - Lymphedema / Segmental Compressive Device / Other: Optional: One layer of unna paste to top of compression wrap (to act as an anchor). Tubigrip single layer applied. - RIGHT LEG tubi grip applied "E" until you get your compression sock use this and it's hand washable Patient to wear own compression stockings. Remove compression stockings every night before going to bed and put on every morning when getting up. - on RIGHT LEG call [facility] for compression sock 15-22 mm/Hg, measurements and phone number on blue sheet [employer] on [employer] in [place] also has the compression socks Elevate, Exercise Daily and Avoid Standing for Long Periods of Time. Elevate legs to the level of the heart and pump ankles as often as possible Elevate leg(s) parallel to the floor when sitting. Compression Pump: Use compression pump on left lower extremity for 60 minutes, twice daily. - You may pump over wraps Compression Pump: Use compression pump on right lower extremity for 60 minutes, twice daily. - You may pump over wraps Additional Orders / Instructions: ESSIE, LAGUNES ([id number]) Wound #2 Left,Lateral Lower Leg: Follow Nutritious Diet and Increase Protein Intake - Increase protein intake. WOUND #2: - Lower Leg Wound Laterality: Left, Lateral Cleanser: Soap and Water 1 x Per  Week/30 Days Discharge Instructions: Gently cleanse wound with antibacterial soap, rinse and pat dry prior to dressing wounds Primary Dressing: Hydrofera Blue Ready Transfer Foam, 2.5x2.5 (in/in) 1 x Per Week/30 Days Discharge Instructions: Apply Hydrofera Blue Ready to wound bed as directed Secondary Dressing: ABD Pad 5x9 (in/in) 1 x Per Week/30 Days Discharge Instructions: Cover with ABD pad Compression Wrap: Profore Lite LF 3 Multilayer Compression Bandaging System 1 x Per Week/30 Days Discharge Instructions: Apply 3 multi-layer wrap as prescribed. 1. Would recommend that we going continue with wound care measures as before and the patient is in agreement with the plan. This includes the use of the [facility]. I do think  this is doing a great job and to be honest the patient's wound is measuring a little bit smaller today as well. Hopefully this will continue to show signs of good improvement. 2. With regard to the dressing again followed by the [facility] we will then put on a continuation of 3 layer compression wrap which is helping to control the edema as well. We will see patient back for reevaluation in 1 week here in the clinic. If anything worsens or changes patient will contact our office for additional recommendations. Electronic Signature(s) Signed: 06/11/2021 1:26:57 PM By: Worthy Keeler PA-C Entered By: Worthy Keeler on 06/11/2021 13:26:57 [name] ([id number]) -------------------------------------------------------------------------------- SuperBill Details Patient Name: [name] Date of Service: 06/11/2021 Medical Record Number: [id number] Patient Account Number: [id number] Date of Birth/Sex: 16-Feb-1946 (75 y.o. F) Treating RN: Carlene Coria Primary Care Provider: [name] Other Clinician: Referring Provider: [name] Treating Provider/Extender: Skipper Cliche in Treatment: 20 Diagnosis Coding ICD-10 Codes Code Description I89.0  Lymphedema, not elsewhere classified I87.332 Chronic venous hypertension (idiopathic) with ulcer and inflammation of left lower extremity L97.828 Non-pressure chronic ulcer of other part of left lower leg with other specified severity Facility Procedures CPT4 Code: [id number] Description: 03491 - DEB SUBQ TISSUE 20 SQ CM/< Modifier: Quantity: 1 CPT4 Code: Description: ICD-10 Diagnosis Description L97.828 Non-pressure chronic ulcer of other part of left lower leg with other spec Modifier: ified severity Quantity: Physician Procedures CPT4 Code: 7915056 Description: 97948 - WC PHYS SUBQ TISS 20 SQ CM Modifier: Quantity: 1 CPT4 Code: Description: ICD-10 Diagnosis Description L97.828 Non-pressure chronic ulcer of other part of left lower leg with other spec Modifier: ified severity Quantity: Electronic Signature(s) Signed: 06/11/2021 1:27:08 PM By: Worthy Keeler PA-C Entered By: Worthy Keeler on 06/11/2021 13:27:07

## 2021-06-18 ENCOUNTER — Encounter: Payer: Medicare Other | Admitting: Internal Medicine

## 2021-06-18 ENCOUNTER — Other Ambulatory Visit: Payer: Self-pay

## 2021-06-18 DIAGNOSIS — I87332 Chronic venous hypertension (idiopathic) with ulcer and inflammation of left lower extremity: Secondary | ICD-10-CM | POA: Diagnosis not present

## 2021-06-20 NOTE — Progress Notes (Signed)
AKEMI, OVERHOLSER ([id number]) Visit Report for 06/18/2021 Arrival Information Details Patient Name: [name], [name] Date of Service: 06/18/2021 1:15 PM Medical Record Number: [id number] Patient Account Number: [id number] Date of Birth/Sex: 1946-07-12 (75 y.o. F) Treating RN: Carlene Coria Primary Care Naksh Radi: [name] Other Clinician: Referring Harriet Bollen: [name] Treating Jahlon Baines/Extender: Tito Dine in Treatment: 21 Visit Information History Since Last Visit All ordered tests and consults were completed: No Patient Arrived: Ambulatory Added or deleted any medications: No Arrival Time: 13:19 Any new allergies or adverse reactions: No Accompanied By: self Had a fall or experienced change in No Transfer Assistance: None activities of daily living that may affect Patient Requires Transmission-Based Precautions: No risk of falls: Patient Has Alerts: Yes Signs or symptoms of abuse/neglect since last visito No Patient Alerts: NOT DIABETIC Hospitalized since last visit: No Implantable device outside of the clinic excluding No cellular tissue based products placed in the center since last visit: Has Dressing in Place as Prescribed: Yes Has Compression in Place as Prescribed: Yes Pain Present Now: No Electronic Signature(s) Signed: 06/20/2021 1:01:44 PM By: Carlene Coria RN Entered By: Carlene Coria on 06/18/2021 13:32:14 [name] ([id number]) -------------------------------------------------------------------------------- Clinic Level of Care Assessment Details Patient Name: [name] Date of Service: 06/18/2021 1:15 PM Medical Record Number: [id number] Patient Account Number: [id number] Date of Birth/Sex: 22-Jan-1946 (75 y.o. F) Treating RN: Carlene Coria Primary Care Luane Rochon: [name] Other Clinician: Referring Edelmira Gallogly: [name] Treating Annalisia Ingber/Extender: Tito Dine in Treatment: 21 Clinic Level of Care Assessment  Items TOOL 1 Quantity Score _0  - Use when EandM and Procedure is performed on INITIAL visit 0 ASSESSMENTS - Nursing Assessment / Reassessment _1  - General Physical Exam (combine w/ comprehensive assessment (listed just below) when performed on new 0 pt. evals) _2  - 0 Comprehensive Assessment (HX, ROS, Risk Assessments, Wounds Hx, etc.) ASSESSMENTS - Wound and Skin Assessment / Reassessment _3  - Dermatologic / Skin Assessment (not related to wound area) 0 ASSESSMENTS - Ostomy and/or Continence Assessment and Care _4  - Incontinence Assessment and Management 0 _5  - 0 Ostomy Care Assessment and Management (repouching, etc.) PROCESS - Coordination of Care _6  - Simple Patient / Family Education for ongoing care 0 _7  - 0 Complex (extensive) Patient / Family Education for ongoing care _8  - 0 Staff obtains Programmer, systems, Records, Test Results / Process Orders _9  - 0 Staff telephones HHA, Nursing Homes / Clarify orders / etc _10  - 0 Routine Transfer to another Facility (non-emergent condition) _11  - 0 Routine Hospital Admission (non-emergent condition) _12  - 0 New Admissions / Biomedical engineer / Ordering NPWT, Apligraf, etc. _13  - 0 Emergency Hospital Admission (emergent condition) PROCESS - Special Needs _14  - Pediatric / Minor Patient Management 0 _15  - 0 Isolation Patient Management _16  - 0 Hearing / Language / Visual special needs _17  - 0 Assessment of Community assistance (transportation, D/C planning, etc.) _18  - 0 Additional assistance / Altered mentation _19  - 0 Support Surface(s) Assessment (bed, cushion, seat, etc.) INTERVENTIONS - Miscellaneous _20  - External ear exam 0 _21  - 0 Patient Transfer (multiple staff / Civil Service fast streamer / Similar devices) _22  - 0 Simple Staple / Suture removal (25 or less) _23  - 0 Complex Staple / Suture removal (26 or more) _24  - 0 Hypo/Hyperglycemic Management (do not check if billed separately) _25  - 0 Ankle / Brachial Index (ABI) - do not check if  billed separately Has the patient been seen at the hospital within the last three years: Yes Total Score: 0 Level  Of Care: ____ [name] ([id number]) Electronic Signature(s) Signed: 06/20/2021 1:01:44 PM By: Carlene Coria RN Entered By: Carlene Coria on 06/18/2021 13:50:40 [name] ([id number]) -------------------------------------------------------------------------------- Compression Therapy Details Patient Name: [name] Date of Service: 06/18/2021 1:15 PM Medical Record Number: [id number] Patient Account Number: [id number] Date of Birth/Sex: 10-Dec-1945 (75 y.o. F) Treating RN: Carlene Coria Primary Care Marcquis Ridlon: [name] Other Clinician: Referring Willie Loy: [name] Treating Lizzete Gough/Extender: Tito Dine in Treatment: 21 Compression Therapy Performed for Wound Assessment: Wound #2 Left,Lateral Lower Leg Performed By: Clinician Carlene Coria, RN Compression Type: Three Layer Post Procedure Diagnosis Same as Pre-procedure Electronic Signature(s) Signed: 06/20/2021 1:01:44 PM By: Carlene Coria RN Entered By: Carlene Coria on 06/18/2021 13:50:11 [name] ([id number]) -------------------------------------------------------------------------------- Encounter Discharge Information Details Patient Name: [name] Date of Service: 06/18/2021 1:15 PM Medical Record Number: [id number] Patient Account Number: [id number] Date of Birth/Sex: [birth year] (75 y.o. F) Treating RN: Carlene Coria Primary Care Amore Ackman: [name] Other Clinician: Referring Meeka Cartelli: [name] Treating Kellye Mizner/Extender: Tito Dine in Treatment: 21 Encounter Discharge Information Items Discharge Condition: Stable Ambulatory Status: Wheelchair Discharge Destination: Home Transportation: Private Auto Accompanied By: self Schedule Follow-up Appointment: Yes Clinical Summary of Care: Patient Declined Electronic Signature(s) Signed:  06/20/2021 1:01:44 PM By: Carlene Coria RN Entered By: Carlene Coria on 06/18/2021 13:51:44 [name] ([id number]) -------------------------------------------------------------------------------- Lower Extremity Assessment Details Patient Name: [name] Date of Service: 06/18/2021 1:15 PM Medical Record Number: [id number] Patient Account Number: [id number] Date of Birth/Sex: 14-May-1946 (75 y.o. F) Treating RN: Carlene Coria Primary Care Janasia Coverdale: [name] Other Clinician: Referring Latish Toutant: [name] Treating Haidy Kackley/Extender: Tito Dine in Treatment: 21 Edema Assessment Assessed: [Left: No] [Right: No] [Left: Edema] [Right: :] Calf Left: Right: Point of Measurement: 27 cm From Medial Instep 39 cm Ankle Left: Right: Point of Measurement: 10 cm From Medial Instep 26 cm Vascular Assessment Pulses: Dorsalis Pedis Palpable: [Left:Yes] Electronic Signature(s) Signed: 06/20/2021 1:01:44 PM By: Carlene Coria RN Entered By: Carlene Coria on 06/18/2021 13:34:10 [name] ([id number]) -------------------------------------------------------------------------------- Multi Wound Chart Details Patient Name: [name] Date of Service: 06/18/2021 1:15 PM Medical Record Number: [id number] Patient Account Number: [id number] Date of Birth/Sex: [birth year] (75 y.o. F) Treating RN: Carlene Coria Primary Care Alegandro Macnaughton: [name] Other Clinician: Referring Sariya Trickey: [name] Treating Akyla Vavrek/Extender: Tito Dine in Treatment: 21 Vital Signs Height(in): 69 Pulse(bpm): 12 Weight(lbs): 244 Blood Pressure(mmHg): 148/80 Body Mass Index(BMI): 36 Temperature(F): 98.2 Respiratory Rate(breaths/min): 18 Photos: [N/A:N/A] Wound Location: Left, Lateral Lower Leg N/A N/A Wounding Event: Gradually Appeared N/A N/A Primary Etiology: Venous Leg Ulcer N/A N/A Comorbid History: Cataracts, Lymphedema, Sleep N/A N/A Apnea, Congestive Heart  Failure, Hypertension, Osteoarthritis, Neuropathy, Received Chemotherapy, Received Radiation Date Acquired: 09/08/2020 N/A N/A Weeks of Treatment: 21 N/A N/A Wound Status: Open N/A N/A Measurements L x W x D (cm) 1.5x1.7x0.1 N/A N/A Area (cm) : 2.003 N/A N/A Volume (cm) : 0.2 N/A N/A % Reduction in Area: 57.50% N/A N/A % Reduction in Volume: 57.50% N/A N/A Classification: Full Thickness Without Exposed N/A N/A Support Structures Exudate Amount: Medium N/A N/A Exudate Type: Serosanguineous N/A N/A Exudate Color: red, brown N/A N/A Granulation Amount: Large (67-100%) N/A N/A Granulation Quality: Red N/A N/A Necrotic Amount: None Present (0%) N/A N/A Exposed Structures: Fat Layer (Subcutaneous Tissue): N/A N/A Yes Fascia: No Tendon: No Muscle: No Joint: No Bone: No Epithelialization: Small (1-33%) N/A N/A Treatment Notes Electronic Signature(s) Signed: 06/20/2021 1:01:44 PM By: Carlene Coria RN Entered By: Carlene Coria on  06/18/2021 13:49:56 MADDUX, FIRST ([id number]) -------------------------------------------------------------------------------- Multi-Disciplinary Care Plan Details Patient Name: KEREN, ALVERIO Date of Service: 06/18/2021 1:15 PM Medical Record Number: [id number] Patient Account Number: [id number] Date of Birth/Sex: 01/06/1946 (75 y.o. F) Treating RN: Carlene Coria Primary Care Jeanie Mccard: [name] Other Clinician: Referring Marques Ericson: [name] Treating Earnie Rockhold/Extender: Tito Dine in Treatment: 21 Active Inactive Wound/Skin Impairment Nursing Diagnoses: Impaired tissue integrity Goals: Patient/caregiver will verbalize understanding of skin care regimen Date Initiated: 01/16/2021 Date Inactivated: 02/18/2021 Target Resolution Date: 02/16/2021 Goal Status: Met Ulcer/skin breakdown will have a volume reduction of 30% by week 4 Date Initiated: 01/16/2021 Date Inactivated: 02/18/2021 Target Resolution Date: 02/16/2021 Goal Status:  Met Ulcer/skin breakdown will have a volume reduction of 50% by week 8 Date Initiated: 01/16/2021 Date Inactivated: 04/01/2021 Target Resolution Date: 03/18/2021 Goal Status: Unmet Unmet Reason: comorbities Ulcer/skin breakdown will have a volume reduction of 80% by week 12 Date Initiated: 04/01/2021 Date Inactivated: 04/23/2021 Target Resolution Date: 04/18/2021 Goal Status: Unmet Unmet Reason: comorbities Ulcer/skin breakdown will heal within 14 weeks Date Initiated: 04/01/2021 Target Resolution Date: 05/19/2021 Goal Status: Active Interventions: Assess ulceration(s) every visit Treatment Activities: Topical wound management initiated : 01/16/2021 Notes: Electronic Signature(s) Signed: 06/20/2021 1:01:44 PM By: Carlene Coria RN Entered By: Carlene Coria on 06/18/2021 13:49:44 [name] ([id number]) -------------------------------------------------------------------------------- Pain Assessment Details Patient Name: [name] Date of Service: 06/18/2021 1:15 PM Medical Record Number: [id number] Patient Account Number: [id number] Date of Birth/Sex: [birth year] (75 y.o. F) Treating RN: Carlene Coria Primary Care Destenee Guerry: [name] Other Clinician: Referring Teyona Nichelson: [name] Treating Fawaz Borquez/Extender: Tito Dine in Treatment: 21 Active Problems Location of Pain Severity and Description of Pain Patient Has Paino No Site Locations Pain Management and Medication Current Pain Management: Electronic Signature(s) Signed: 06/20/2021 1:01:44 PM By: Carlene Coria RN Entered By: Carlene Coria on 06/18/2021 13:32:46 [name] ([id number]) -------------------------------------------------------------------------------- Patient/Caregiver Education Details Patient Name: [name] Date of Service: 06/18/2021 1:15 PM Medical Record Number: [id number] Patient Account Number: [id number] Date of Birth/Gender: 1945/10/24 (75 y.o. F) Treating RN: Carlene Coria Primary Care Physician: [name] Other Clinician: Referring Physician: [name] Treating Physician/Extender: Tito Dine in Treatment: 21 Education Assessment Education Provided To: Patient Education Topics Provided Wound/Skin Impairment: Methods: Explain/Verbal Responses: State content correctly Electronic Signature(s) Signed: 06/20/2021 1:01:44 PM By: Carlene Coria RN Entered By: Carlene Coria on 06/18/2021 13:51:06 [name] ([id number]) -------------------------------------------------------------------------------- Wound Assessment Details Patient Name: [name] Date of Service: 06/18/2021 1:15 PM Medical Record Number: [id number] Patient Account Number: [id number] Date of Birth/Sex: 1946-01-12 (75 y.o. F) Treating RN: Carlene Coria Primary Care Kery Haltiwanger: [name] Other Clinician: Referring Kareem Aul: [name] Treating Aneli Zara/Extender: Tito Dine in Treatment: 21 Wound Status Wound Number: 2 Primary Venous Leg Ulcer Etiology: Wound Location: Left, Lateral Lower Leg Wound Open Wounding Event: Gradually Appeared Status: Date Acquired: 09/08/2020 Comorbid Cataracts, Lymphedema, Sleep Apnea, Congestive Heart Weeks Of Treatment: 21 History: Failure, Hypertension, Osteoarthritis, Neuropathy, Clustered Wound: No Received Chemotherapy, Received Radiation Photos Wound Measurements Length: (cm) 1.5 Width: (cm) 1.7 Depth: (cm) 0.1 Area: (cm) 2.003 Volume: (cm) 0.2 % Reduction in Area: 57.5% % Reduction in Volume: 57.5% Epithelialization: Small (1-33%) Tunneling: No Undermining: No Wound Description Classification: Full Thickness Without Exposed Support Structu Exudate Amount: Medium Exudate Type: Serosanguineous Exudate Color: red, brown res Wound Bed Granulation Amount: Large (67-100%) Exposed Structure Granulation Quality: Red Fascia Exposed: No Necrotic Amount: None Present (0%) Fat Layer  (Subcutaneous Tissue) Exposed: Yes Tendon Exposed: No Muscle Exposed: No Joint  Exposed: No Bone Exposed: No Treatment Notes Wound #2 (Lower Leg) Wound Laterality: Left, Lateral Cleanser Soap and Water Discharge Instruction: Gently cleanse wound with antibacterial soap, rinse and pat dry prior to dressing wounds Peri-Wound Care JASMINE, MCBETH ([id number]) Topical Primary Dressing Hydrofera Blue Ready Transfer Foam, 2.5x2.5 (in/in) Discharge Instruction: Apply Hydrofera Blue Ready to wound bed as directed Secondary Dressing ABD Pad 5x9 (in/in) Discharge Instruction: Cover with ABD pad Secured With Compression Wrap Profore Lite LF 3 Multilayer Compression [place] Discharge Instruction: Apply 3 multi-layer wrap as prescribed. Compression Stockings Add-Ons Electronic Signature(s) Signed: 06/20/2021 1:01:44 PM By: Carlene Coria RN Entered By: Carlene Coria on 06/18/2021 13:33:46 [name] ([id number]) -------------------------------------------------------------------------------- Vitals Details Patient Name: [name] Date of Service: 06/18/2021 1:15 PM Medical Record Number: [id number] Patient Account Number: [id number] Date of Birth/Sex: 1945-11-29 (75 y.o. F) Treating RN: Carlene Coria Primary Care Tino Ronan: [name] Other Clinician: Referring Shariece Viveiros: [name] Treating Dionysios Massman/Extender: Tito Dine in Treatment: 21 Vital Signs Time Taken: 13:32 Temperature (F): 98.2 Height (in): 69 Pulse (bpm): 58 Weight (lbs): 244 Respiratory Rate (breaths/min): 18 Body Mass Index (BMI): 36 Blood Pressure (mmHg): 148/80 Reference Range: 80 - 120 mg / dl Electronic Signature(s) Signed: 06/20/2021 1:01:44 PM By: Carlene Coria RN Entered By: Carlene Coria on 06/18/2021 13:32:39

## 2021-06-20 NOTE — Progress Notes (Signed)
ROSE, HEGNER ([id number]) Visit Report for 06/18/2021 HPI Details Patient Name: [name], [name] Date of Service: 06/18/2021 1:15 PM Medical Record Number: [id number] Patient Account Number: [id number] Date of Birth/Sex: [birth year] (75 y.o. F) Treating RN: Carlene Coria Primary Care Provider: [name] Other Clinician: Referring Provider: [name] Treating Provider/Extender: Tito Dine in Treatment: 21 History of Present Illness HPI Description: 03/15/2020 upon evaluation today patient presents today for initial evaluation here in our clinic concerning a wound that is on the left posterior lower extremity. Unfortunately this has been giving the patient some discomfort at this point which she notes has been affected her pretty much daily. The patient does have a history of venous insufficiency, hypertension, congestive heart failure, and a history of breast cancer. Fortunately there does not appear to be evidence of active infection at this time which is great news. No fevers, chills, nausea, vomiting, or diarrhea. Wound is not extremely large but does have some slough covering the surface of the wound. This is going require sharp debridement today. 03/15/2020 upon evaluation today patient appears to be doing fairly well in regard to her wound. She does have some slough noted on the surface of the wound currently but I do believe the Iodoflex has been beneficial over the past week. There is no signs of active infection at this time which is great news and overall very pleased with the progress. No fevers, chills, nausea, vomiting, or diarrhea. 04/02/2020 upon evaluation today patient appears to be doing a little better in regard to her wound size wise this is not tremendously smaller she does have some slough buildup on the surface of the wound. With that being said this is going require some sharp debridement today to clear away some of the necrotic debris. Fortunately there  is no evidence of active infection at this time. No fevers, chills, nausea, vomiting, or diarrhea. 04/10/2020 on evaluation today patient appears to be doing well with regard to her ulcer which is measuring somewhat smaller today. She actually has more granulation tissue noted which is also good news is no need for sharp debridement today. Fortunately there is no evidence of infection either which is also excellent. No fevers, chills, nausea, vomiting, or diarrhea. 04/26/2020 on evaluation today patient's wound actually is showing signs of good improvement which is great news. There does not appear to be any evidence of active infection. I do believe she is tolerating the collagen at this point. 05/03/2020 upon evaluation today patient's wound actually showed signs of good granulation at this time there does not appear to be any evidence of active infection which is great news and overall very pleased with where things stand. With that being said I do believe that the patient is can require some sharp debridement today but fortunately nothing too significant. 05/11/20 on evaluation today patient appears to be doing well in regard to her leg ulcer. She's been tolerating the dressing changes without complication. Fortunately there is no signs of active infection at this time. Overall I'm very pleased with where things stand. 05/18/2020 upon evaluation today patient's wound is showing signs of improvement is very dry and I think the collagen for the most part just dried to the wound bed. I am going to clean this away with sharp debridement in order to get this under control in my opinion. 05/25/2020 upon evaluation today patient actually appears to be doing well in regard to her leg ulcer. In fact upon inspection it appears she could potentially be  completely healed but that is not guaranteed based on what we are seeing. There does not appear to be any signs of active infection which is great news. 05/31/2020 on  evaluation today patient appears to be doing well with regard to her wounds. She in fact appears to be almost completely healed on the left leg there is just a very small opening remaining point 1 06/08/2020 upon evaluation today patient appears to be doing excellent in regard to her leg ulcer on the left in fact it appears to be that she is completely healed as of today. I see no signs of active infection at this time which is great news. No fevers, chills, nausea, vomiting, or diarrhea. READMISSION 01/16/2021 Patient that we have had in this clinic with a wound on the left posterior calf in the summer 2021 extending into October. She was felt to have chronic venous insufficiency. Per the patient's description she was discharged with what sounds like external compression stockings although she could not afford the "$75 per leg". She therefore did not get anything. Apparently her wound that she has currently is been in existence since January. She has been followed at vein and vascular with Unna boots and I think calcium alginate. She had ABIs done on 06/25/2020 that showed an noncompressible ABI on the right leg and the left leg but triphasic waveforms with good great toe pressures and a TBI of 0.90 on the right and 0.91 on the left Surprisingly the patient also had venous reflux studies that were really unremarkable. This included no evidence of DVTs or SVTs but there was no evidence of deep venous insufficiency or superficial venous insufficiency in the greater saphenous or short saphenous veins bilaterally. I would wonder about more central venous issues or perhaps this is all lymphedema 01/25/2021 upon evaluation today patient appears to be doing decently well and guard to her wound. The good news is the Iodoflex that is job she saw Dr. Dellia Nims last week for readmission and to be honest I think that this has done extremely well over that week. I do believe that she can tolerate the sharp  debridement today which will be good. I think that cleaning off the wound will likely allow [place] to be able to use a different type of dressing I do not think the Iodoflex will even be necessary based on how clean the wound looks. Fortunately there is no sign of active infection at this time which is great news. No fevers, chills, nausea, vomiting, or diarrhea. [name], [name] ([id number]) 02/12/2021 upon evaluation today patient appears to be doing well with regard to her leg ulcer. We did switch to alginate when she came for nurse visit I think is doing much better for her. Fortunately there does not appear to be any signs of active infection at this time which is great news. No fevers, chills, nausea, vomiting, or diarrhea. 02/18/2021 upon evaluation today patient's wound is actually showing signs of good improvement. I am very pleased with where things stand currently. I do not see any signs of active infection which is great news and overall I feel like she is making good progress. The patient likewise is happy that things are doing so well and that this is measuring somewhat smaller. 02/25/2021 upon evaluation today patient actually appears to be doing quite well in regard to her wounds. She has been tolerating the dressing changes without complication. Fortunately there does not appear to be any signs of active infection which is great  news and overall I am extremely pleased with where things stand. No fevers, chills, nausea, vomiting, or diarrhea. She does have a lot of edema I really do think she needs compression socks to be worn in order to prevent things from causing additional issues for her currently. Especially on the right leg she should be wearing compression socks all the time to be honest. 03/04/2021 upon evaluation today patient's leg ulcer actually appears to be doing pretty well which is great news. There does not appear to be any significant change but overall the appearance is  better even though the size is not necessarily reflecting a great improvement. Fortunately there does not appear to be any signs of active infection. No fevers, chills, nausea, vomiting, or diarrhea. 03/12/2021 upon evaluation today patient appears to be doing well with regard to her wounds. She has been tolerating the dressing changes without complication. The good news is that the wound on her left lateral leg is doing much better and is showing signs of good improvement. Overall her swelling is controlled well as well. She does need some compression socks she plans to go Jefferey socks next week. 03/18/2021 on evaluation today patient's wound actually is showing signs of good improvement. She does have a little bit of slough this can require some sharp debridement today. 03/25/2021 upon evaluation today patient appears to be doing well with regard to her wound on the left lateral leg. She has been tolerating the dressing changes without complication. Fortunately there does not appear to be any signs of active infection at this time. No fever chills no 04/01/2021 upon evaluation today patient's wound is showing signs of improvement and overall very pleased with where things stand at this point. There is no evidence of active infection which is great news as well and in general I think that she is making great progress. 04/09/2021 upon evaluation today patient appears to be doing well with regard to her ankle ulcer. She is making good progress currently which is great news. There does not appear to be any signs of infection also excellent news. In general I am extremely pleased with where things stand today. No fevers, chills, nausea, vomiting, or diarrhea. 04/23/2021 upon inspection today patient appears to be doing quite well with regard to her leg ulcer. She has been tolerating the dressing changes without complication. She is can require some sharp debridement today but overall seems to be doing  excellent. 05/06/2021 upon evaluation today patient unfortunately appears to be doing poorly overall in regard to her swelling. She is actually significantly more swollen than last week despite the fact that her wound is doing better this is not a good trend. I think that she probably needs to see her cardiologist ASAP and I discussed that with her today. This includes the fact that honestly I think she probably is getting need an increase in her diuretic or something to try to clear away some of the excess fluid that she is experiencing at the moment. She is not been doing her pumps even twice a day but again right now more concerned that if she were to do the pump she would fluid overload her lungs causing other issues as far as her breathing is concerned. Obviously I think she needs to contact them and move up the appointment for the 12th of something sooner 2 weeks is a bit too long to wait with how swollen she has. 05/14/2021 upon evaluation today patient's wound actually showing signs of being  a little bit smaller compared to previous. She has been making good progress however. Fortunately there does not appear to be any evidence of active infection which is great. Overall I am extremely pleased in that regard. Nonetheless I am still concerned that she is having quite a bit of issue as far as the swelling is concerned she has been placed on fluid restriction as well as an increase in the furosemide by her doctor. We will see how things progress. 05/21/2021 upon evaluation today patient appears to be doing well with regard to her wound in general. She has been tolerating the dressing changes without complication. With that being said she has been using silver cell for a while and while this was showing signs of improvement it is now gotten to the point where this has slowed down quite significantly. I do believe that switching to a different dressing may be beneficial for her today. 05/28/2021 upon  evaluation today patient actually appears to be doing quite well in regard to her wound. In fact there is really not any need for sharp debridement today based on what I see. The Hydrofera Blue is doing great and overall I think that she is managing quite nicely with the compression wrapping. Her edema is still down quite a bit with the wrapping in place. 06/11/2021 upon evaluation today patient appears to be doing well with regard to her leg ulcer. She has been tolerating the dressing changes without complication. Fortunately there does not appear to be any signs of active infection at this time which is great news. No fevers, chills, nausea, vomiting, or diarrhea. She is going require some sharp debridement today. 10/11; left leg ulcer much better looking and with improved surface area. She is using Hydrofera Blue under 3 layer compression. She does not have any stocking on the right leg which in itself has very significant nonpitting edema Electronic Signature(s) Signed: 06/18/2021 3:54:48 PM By: Linton Ham MD Entered By: Linton Ham on 06/18/2021 13:45:06 [name] ([id number]) -------------------------------------------------------------------------------- Physical Exam Details Patient Name: [name] Date of Service: 06/18/2021 1:15 PM Medical Record Number: [id number] Patient Account Number: [id number] Date of Birth/Sex: 1946-04-29 (75 y.o. F) Treating RN: Carlene Coria Primary Care Provider: [name] Other Clinician: Referring Provider: [name] Treating Provider/Extender: Tito Dine in Treatment: 21 Constitutional Patient is hypertensive.. Pulse regular and within target range for patient.Marland Kitchen Respirations regular, non-labored and within target range.. Temperature is normal and within the target range for the patient.Marland Kitchen appears in no distress. Respiratory Respiratory effort is easy and symmetric bilaterally. Rate is normal at rest and on room  air.. Notes Wound exam; the patient's wound bed looks really healthy. Nice granulation. No debridement was required. No evidence of surrounding infection. The edema control on the left seems adequate although she has some nonpitting edema more proximally towards the knee this does not seem to be affecting her healing. On the right side she has marked nonpitting edema. Electronic Signature(s) Signed: 06/18/2021 3:54:48 PM By: Linton Ham MD Entered By: Linton Ham on 06/18/2021 13:46:36 [name] ([id number]) -------------------------------------------------------------------------------- Physician Orders Details Patient Name: [name] Date of Service: 06/18/2021 1:15 PM Medical Record Number: [id number] Patient Account Number: [id number] Date of Birth/Sex: 11-07-1945 (75 y.o. F) Treating RN: Carlene Coria Primary Care Provider: [name] Other Clinician: Referring Provider: [name] Treating Provider/Extender: Tito Dine in Treatment: 51 Verbal / Phone Orders: No Diagnosis Coding ICD-10 Coding Code Description I89.0 Lymphedema, not elsewhere classified I87.332 Chronic venous hypertension (  idiopathic) with ulcer and inflammation of left lower extremity L97.828 Non-pressure chronic ulcer of other part of left lower leg with other specified severity Follow-up Appointments o Return Appointment in 1 week. Bathing/ Shower/ Hygiene o May shower with wound dressing protected with water repellent cover or cast protector. Edema Control - Lymphedema / Segmental Compressive Device / Other Left Lower Extremity o Optional: One layer of unna paste to top of compression wrap (to act as an anchor). o Tubigrip single layer applied. - RIGHT LEG tubi grip applied "E" until you get your compression sock use this and it's hand washable o Patient to wear own compression stockings. Remove compression stockings every night before going to bed and put  on every morning when getting up. - on RIGHT LEG call [facility] for compression sock 15-22 mm/Hg, measurements and phone number on blue sheet [employer] on [employer] in [place] also has the compression socks o Elevate, Exercise Daily and Avoid Standing for Long Periods of Time. o Elevate legs to the level of the heart and pump ankles as often as possible o Elevate leg(s) parallel to the floor when sitting. o Compression Pump: Use compression pump on left lower extremity for 60 minutes, twice daily. - You may pump over wraps o Compression Pump: Use compression pump on right lower extremity for 60 minutes, twice daily. - You may pump over wraps Additional Orders / Instructions Wound #2 Left,Lateral Lower Leg o Follow Nutritious Diet and Increase Protein Intake - Increase protein intake. Wound Treatment Wound #2 - Lower Leg Wound Laterality: Left, Lateral Cleanser: Soap and Water 1 x Per Week/30 Days Discharge Instructions: Gently cleanse wound with antibacterial soap, rinse and pat dry prior to dressing wounds Primary Dressing: Hydrofera Blue Ready Transfer Foam, 2.5x2.5 (in/in) 1 x Per Week/30 Days Discharge Instructions: Apply Hydrofera Blue Ready to wound bed as directed Secondary Dressing: ABD Pad 5x9 (in/in) 1 x Per Week/30 Days Discharge Instructions: Cover with ABD pad Compression Wrap: Profore Lite LF 3 Multilayer Compression Bandaging System 1 x Per Week/30 Days Discharge Instructions: Apply 3 multi-layer wrap as prescribed. Electronic Signature(s) Signed: 06/18/2021 3:54:48 PM By: Linton Ham MD Signed: 06/20/2021 1:01:44 PM By: Carlene Coria RN Entered By: Carlene Coria on 06/18/2021 13:50:33 [name] ([id number]) -------------------------------------------------------------------------------- Problem List Details Patient Name: [name] Date of Service: 06/18/2021 1:15 PM Medical Record Number: [id number] Patient Account Number:  [id number] Date of Birth/Sex: 11/30/1945 (75 y.o. F) Treating RN: Carlene Coria Primary Care Provider: [name] Other Clinician: Referring Provider: [name] Treating Provider/Extender: Tito Dine in Treatment: 21 Active Problems ICD-10 Encounter Code Description Active Date MDM Diagnosis I89.0 Lymphedema, not elsewhere classified 01/16/2021 No Yes I87.332 Chronic venous hypertension (idiopathic) with ulcer and inflammation of 01/16/2021 No Yes left lower extremity L97.828 Non-pressure chronic ulcer of other part of left lower leg with other 01/16/2021 No Yes specified severity Inactive Problems Resolved Problems Electronic Signature(s) Signed: 06/18/2021 3:54:48 PM By: Linton Ham MD Entered By: Linton Ham on 06/18/2021 13:44:21 [name] ([id number]) -------------------------------------------------------------------------------- Progress Note Details Patient Name: [name] Date of Service: 06/18/2021 1:15 PM Medical Record Number: [id number] Patient Account Number: [id number] Date of Birth/Sex: 1945-11-30 (75 y.o. F) Treating RN: Carlene Coria Primary Care Provider: [name] Other Clinician: Referring Provider: [name] Treating Provider/Extender: Tito Dine in Treatment: 21 Subjective History of Present Illness (HPI) 03/15/2020 upon evaluation today patient presents today for initial evaluation here in our clinic concerning a wound that is on the left posterior  lower extremity. Unfortunately this has been giving the patient some discomfort at this point which she notes has been affected her pretty much daily. The patient does have a history of venous insufficiency, hypertension, congestive heart failure, and a history of breast cancer. Fortunately there does not appear to be evidence of active infection at this time which is great news. No fevers, chills, nausea, vomiting, or diarrhea. Wound is not extremely large but  does have some slough covering the surface of the wound. This is going require sharp debridement today. 03/15/2020 upon evaluation today patient appears to be doing fairly well in regard to her wound. She does have some slough noted on the surface of the wound currently but I do believe the Iodoflex has been beneficial over the past week. There is no signs of active infection at this time which is great news and overall very pleased with the progress. No fevers, chills, nausea, vomiting, or diarrhea. 04/02/2020 upon evaluation today patient appears to be doing a little better in regard to her wound size wise this is not tremendously smaller she does have some slough buildup on the surface of the wound. With that being said this is going require some sharp debridement today to clear away some of the necrotic debris. Fortunately there is no evidence of active infection at this time. No fevers, chills, nausea, vomiting, or diarrhea. 04/10/2020 on evaluation today patient appears to be doing well with regard to her ulcer which is measuring somewhat smaller today. She actually has more granulation tissue noted which is also good news is no need for sharp debridement today. Fortunately there is no evidence of infection either which is also excellent. No fevers, chills, nausea, vomiting, or diarrhea. 04/26/2020 on evaluation today patient's wound actually is showing signs of good improvement which is great news. There does not appear to be any evidence of active infection. I do believe she is tolerating the collagen at this point. 05/03/2020 upon evaluation today patient's wound actually showed signs of good granulation at this time there does not appear to be any evidence of active infection which is great news and overall very pleased with where things stand. With that being said I do believe that the patient is can require some sharp debridement today but fortunately nothing too significant. 05/11/20 on evaluation  today patient appears to be doing well in regard to her leg ulcer. She's been tolerating the dressing changes without complication. Fortunately there is no signs of active infection at this time. Overall I'm very pleased with where things stand. 05/18/2020 upon evaluation today patient's wound is showing signs of improvement is very dry and I think the collagen for the most part just dried to the wound bed. I am going to clean this away with sharp debridement in order to get this under control in my opinion. 05/25/2020 upon evaluation today patient actually appears to be doing well in regard to her leg ulcer. In fact upon inspection it appears she could potentially be completely healed but that is not guaranteed based on what we are seeing. There does not appear to be any signs of active infection which is great news. 05/31/2020 on evaluation today patient appears to be doing well with regard to her wounds. She in fact appears to be almost completely healed on the left leg there is just a very small opening remaining point 1 06/08/2020 upon evaluation today patient appears to be doing excellent in regard to her leg ulcer on the left in  fact it appears to be that she is completely healed as of today. I see no signs of active infection at this time which is great news. No fevers, chills, nausea, vomiting, or diarrhea. READMISSION 01/16/2021 Patient that we have had in this clinic with a wound on the left posterior calf in the summer 2021 extending into October. She was felt to have chronic venous insufficiency. Per the patient's description she was discharged with what sounds like external compression stockings although she could not afford the "$75 per leg". She therefore did not get anything. Apparently her wound that she has currently is been in existence since January. She has been followed at vein and vascular with Unna boots and I think calcium alginate. She had ABIs done on 06/25/2020 that showed an  noncompressible ABI on the right leg and the left leg but triphasic waveforms with good great toe pressures and a TBI of 0.90 on the right and 0.91 on the left Surprisingly the patient also had venous reflux studies that were really unremarkable. This included no evidence of DVTs or SVTs but there was no evidence of deep venous insufficiency or superficial venous insufficiency in the greater saphenous or short saphenous veins bilaterally. I would wonder about more central venous issues or perhaps this is all lymphedema 01/25/2021 upon evaluation today patient appears to be doing decently well and guard to her wound. The good news is the Iodoflex that is job she saw Dr. Dellia Nims last week for readmission and to be honest I think that this has done extremely well over that week. I do believe that she can tolerate the sharp debridement today which will be good. I think that cleaning off the wound will likely allow [place] to be able to use a different type of dressing I do not think the Iodoflex will even be necessary based on how clean the wound looks. Fortunately there is no sign of active infection at this time which is great news. No fevers, chills, nausea, vomiting, or diarrhea. 02/12/2021 upon evaluation today patient appears to be doing well with regard to her leg ulcer. We did switch to alginate when she came for nurse visit I think is doing much better for her. Fortunately there does not appear to be any signs of active infection at this time which is great news. No fevers, chills, nausea, vomiting, or diarrhea. [name], [name] ([id number]) 02/18/2021 upon evaluation today patient's wound is actually showing signs of good improvement. I am very pleased with where things stand currently. I do not see any signs of active infection which is great news and overall I feel like she is making good progress. The patient likewise is happy that things are doing so well and that this is measuring somewhat  smaller. 02/25/2021 upon evaluation today patient actually appears to be doing quite well in regard to her wounds. She has been tolerating the dressing changes without complication. Fortunately there does not appear to be any signs of active infection which is great news and overall I am extremely pleased with where things stand. No fevers, chills, nausea, vomiting, or diarrhea. She does have a lot of edema I really do think she needs compression socks to be worn in order to prevent things from causing additional issues for her currently. Especially on the right leg she should be wearing compression socks all the time to be honest. 03/04/2021 upon evaluation today patient's leg ulcer actually appears to be doing pretty well which is great news. There does not  appear to be any significant change but overall the appearance is better even though the size is not necessarily reflecting a great improvement. Fortunately there does not appear to be any signs of active infection. No fevers, chills, nausea, vomiting, or diarrhea. 03/12/2021 upon evaluation today patient appears to be doing well with regard to her wounds. She has been tolerating the dressing changes without complication. The good news is that the wound on her left lateral leg is doing much better and is showing signs of good improvement. Overall her swelling is controlled well as well. She does need some compression socks she plans to go Jefferey socks next week. 03/18/2021 on evaluation today patient's wound actually is showing signs of good improvement. She does have a little bit of slough this can require some sharp debridement today. 03/25/2021 upon evaluation today patient appears to be doing well with regard to her wound on the left lateral leg. She has been tolerating the dressing changes without complication. Fortunately there does not appear to be any signs of active infection at this time. No fever chills no 04/01/2021 upon evaluation today  patient's wound is showing signs of improvement and overall very pleased with where things stand at this point. There is no evidence of active infection which is great news as well and in general I think that she is making great progress. 04/09/2021 upon evaluation today patient appears to be doing well with regard to her ankle ulcer. She is making good progress currently which is great news. There does not appear to be any signs of infection also excellent news. In general I am extremely pleased with where things stand today. No fevers, chills, nausea, vomiting, or diarrhea. 04/23/2021 upon inspection today patient appears to be doing quite well with regard to her leg ulcer. She has been tolerating the dressing changes without complication. She is can require some sharp debridement today but overall seems to be doing excellent. 05/06/2021 upon evaluation today patient unfortunately appears to be doing poorly overall in regard to her swelling. She is actually significantly more swollen than last week despite the fact that her wound is doing better this is not a good trend. I think that she probably needs to see her cardiologist ASAP and I discussed that with her today. This includes the fact that honestly I think she probably is getting need an increase in her diuretic or something to try to clear away some of the excess fluid that she is experiencing at the moment. She is not been doing her pumps even twice a day but again right now more concerned that if she were to do the pump she would fluid overload her lungs causing other issues as far as her breathing is concerned. Obviously I think she needs to contact them and move up the appointment for the 12th of something sooner 2 weeks is a bit too long to wait with how swollen she has. 05/14/2021 upon evaluation today patient's wound actually showing signs of being a little bit smaller compared to previous. She has been making good progress however.  Fortunately there does not appear to be any evidence of active infection which is great. Overall I am extremely pleased in that regard. Nonetheless I am still concerned that she is having quite a bit of issue as far as the swelling is concerned she has been placed on fluid restriction as well as an increase in the furosemide by her doctor. We will see how things progress. 05/21/2021 upon evaluation today  patient appears to be doing well with regard to her wound in general. She has been tolerating the dressing changes without complication. With that being said she has been using silver cell for a while and while this was showing signs of improvement it is now gotten to the point where this has slowed down quite significantly. I do believe that switching to a different dressing may be beneficial for her today. 05/28/2021 upon evaluation today patient actually appears to be doing quite well in regard to her wound. In fact there is really not any need for sharp debridement today based on what I see. The Hydrofera Blue is doing great and overall I think that she is managing quite nicely with the compression wrapping. Her edema is still down quite a bit with the wrapping in place. 06/11/2021 upon evaluation today patient appears to be doing well with regard to her leg ulcer. She has been tolerating the dressing changes without complication. Fortunately there does not appear to be any signs of active infection at this time which is great news. No fevers, chills, nausea, vomiting, or diarrhea. She is going require some sharp debridement today. 10/11; left leg ulcer much better looking and with improved surface area. She is using Hydrofera Blue under 3 layer compression. She does not have any stocking on the right leg which in itself has very significant nonpitting edema Objective Constitutional Patient is hypertensive.. Pulse regular and within target range for patient.Marland Kitchen Respirations regular, non-labored and  within target range.. Temperature is normal and within the target range for the patient.Marland Kitchen appears in no distress. Vitals Time Taken: 1:32 PM, Height: 69 in, Weight: 244 lbs, BMI: 36, Temperature: 98.2 F, Pulse: 58 bpm, Respiratory Rate: 18 breaths/min, Blood Pressure: 148/80 mmHg. [place], [name] ([id number]) Respiratory Respiratory effort is easy and symmetric bilaterally. Rate is normal at rest and on room air.. General Notes: Wound exam; the patient's wound bed looks really healthy. Nice granulation. No debridement was required. No evidence of surrounding infection. The edema control on the left seems adequate although she has some nonpitting edema more proximally towards the knee this does not seem to be affecting her healing. On the right side she has marked nonpitting edema. Integumentary (Hair, Skin) Wound #2 status is Open. Original cause of wound was Gradually Appeared. The date acquired was: 09/08/2020. The wound has been in treatment 21 weeks. The wound is located on the Left,Lateral Lower Leg. The wound measures 1.5cm length x 1.7cm width x 0.1cm depth; 2.003cm^2 area and 0.2cm^3 volume. There is Fat Layer (Subcutaneous Tissue) exposed. There is no tunneling or undermining noted. There is a medium amount of serosanguineous drainage noted. There is large (67-100%) red granulation within the wound bed. There is no necrotic tissue within the wound bed. 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 No reason to change the dressing here which includes Hydrofera Blue under 3 layer compression with an ABD 2. I spent some time talking to the patient about stockings. Our intake nurse that she is supposed to have 1 she did not have 1 on the right today and looking at her leg  event she does say she probably has not had 1 on for a while. I think this is preulcerative edema she has now. Electronic Signature(s) Signed: 06/18/2021 3:54:48 PM By: Linton Ham MD Entered By: Dellia Nims,  Oksana Deberry on 06/18/2021 13:47:27 JEWELLE, WHITNER ([id number]) -------------------------------------------------------------------------------- SuperBill Details Patient Name: [name], [name] Date of Service: 06/18/2021 Medical Record Number: [id number] Patient Account Number: [id number] Date of Birth/Sex: [birth year] (74 y.o. F) Treating RN: Carlene Coria Primary Care Provider: [name] Other Clinician: Referring Provider: [name] Treating Provider/Extender: Tito Dine in Treatment: 21 Diagnosis Coding ICD-10 Codes Code Description I89.0 Lymphedema, not elsewhere classified I87.332 Chronic venous hypertension (idiopathic) with ulcer and inflammation of left lower extremity L97.828 Non-pressure chronic ulcer of other part of left lower leg with other specified severity Facility Procedures CPT4 Code: 03888280 Description: (Facility Use Only) 29581LT - [place] Modifier: Quantity: 1 Electronic Signature(s) Signed: 06/18/2021 3:54:48 PM By: Linton Ham MD Signed: 06/20/2021 1:01:44 PM By: Carlene Coria RN Entered By: Carlene Coria on 06/18/2021 13:50:54

## 2021-06-25 ENCOUNTER — Other Ambulatory Visit: Payer: Self-pay

## 2021-06-25 ENCOUNTER — Encounter: Payer: Medicare Other | Admitting: Physician Assistant

## 2021-06-25 DIAGNOSIS — I87332 Chronic venous hypertension (idiopathic) with ulcer and inflammation of left lower extremity: Secondary | ICD-10-CM | POA: Diagnosis not present

## 2021-06-25 NOTE — Progress Notes (Signed)
[name], [name] ([id number]) Visit Report for 06/25/2021 Chief Complaint Document Details Patient Name: [name], [name] Date of Service: 06/25/2021 1:15 PM Medical Record Number: [id number] Patient Account Number: [id number] Date of Birth/Sex: 1946/02/07 (75 y.o. F) Treating RN: Carlene Coria Primary Care Provider: [name] Other Clinician: Referring Provider: [name] Treating Provider/Extender: Skipper Cliche in Treatment: 22 Information Obtained from: Patient Chief Complaint Left LE Ulcer Electronic Signature(s) Signed: 06/25/2021 1:30:40 PM By: Worthy Keeler PA-C Entered By: Worthy Keeler on 06/25/2021 13:30:39 [name] ([id number]) -------------------------------------------------------------------------------- Physician Orders Details Patient Name: [name] Date of Service: 06/25/2021 1:15 PM Medical Record Number: [id number] Patient Account Number: [id number] Date of Birth/Sex: [birth year] (75 y.o. F) Treating RN: Carlene Coria Primary Care Provider: [name] Other Clinician: Referring Provider: [name] Treating Provider/Extender: Skipper Cliche in Treatment: 22 Verbal / Phone Orders: No Diagnosis Coding ICD-10 Coding Code Description I89.0 Lymphedema, not elsewhere classified I87.332 Chronic venous hypertension (idiopathic) with ulcer and inflammation of left lower extremity L97.828 Non-pressure chronic ulcer of other part of left lower leg with other specified severity Follow-up Appointments o Return Appointment in 1 week. Bathing/ Shower/ Hygiene o May shower with wound dressing protected with water repellent cover or cast protector. Edema Control - Lymphedema / Segmental Compressive Device / Other Left Lower Extremity o Optional: One layer of unna paste to top of compression wrap (to act as an anchor). o Tubigrip single layer applied. - RIGHT LEG tubi grip applied "E" until you get your compression sock use this  and it's hand washable o Patient to wear own compression stockings. Remove compression stockings every night before going to bed and put on every morning when getting up. - on RIGHT LEG call [employer] for compression sock 15-22 mm/Hg, measurements and phone number on blue sheet [employer] on [address] in [place] also has the compression socks o Elevate, Exercise Daily and Avoid Standing for Long Periods of Time. o Elevate legs to the level of the heart and pump ankles as often as possible o Elevate leg(s) parallel to the floor when sitting. o Compression Pump: Use compression pump on left lower extremity for 60 minutes, twice daily. - You may pump over wraps o Compression Pump: Use compression pump on right lower extremity for 60 minutes, twice daily. - You may pump over wraps Additional Orders / Instructions Wound #2 Left,Lateral Lower Leg o Follow Nutritious Diet and Increase Protein Intake - Increase protein intake. Wound Treatment Wound #2 - Lower Leg Wound Laterality: Left, Lateral Cleanser: Soap and Water 1 x Per Week/30 Days Discharge Instructions: Gently cleanse wound with antibacterial soap, rinse and pat dry prior to dressing wounds Primary Dressing: Hydrofera Blue Ready Transfer Foam, 2.5x2.5 (in/in) 1 x Per Week/30 Days Discharge Instructions: Apply Hydrofera Blue Ready to wound bed as directed Secondary Dressing: ABD Pad 5x9 (in/in) 1 x Per Week/30 Days Discharge Instructions: Cover with ABD pad Compression Wrap: Profore Lite LF 3 Multilayer Compression Bandaging System 1 x Per Week/30 Days Discharge Instructions: Apply 3 multi-layer wrap as prescribed. Electronic Signature(s) Signed: 06/25/2021 1:52:23 PM By: Carlene Coria RN Entered By: Carlene Coria on 06/25/2021 13:52:22 [name] ([id number]) -------------------------------------------------------------------------------- Problem List Details Patient Name: [name] Date of  Service: 06/25/2021 1:15 PM Medical Record Number: [id number] Patient Account Number: [id number] Date of Birth/Sex: 09/04/1946 (75 y.o. F) Treating RN: Carlene Coria Primary Care Provider: [name] Other Clinician: Referring Provider: [name] Treating Provider/Extender: Skipper Cliche in Treatment: 22 Active Problems ICD-10 Encounter Code  Description Active Date MDM Diagnosis I89.0 Lymphedema, not elsewhere classified 01/16/2021 No Yes I87.332 Chronic venous hypertension (idiopathic) with ulcer and inflammation of 01/16/2021 No Yes left lower extremity L97.828 Non-pressure chronic ulcer of other part of left lower leg with other 01/16/2021 No Yes specified severity Inactive Problems Resolved Problems Electronic Signature(s) Signed: 06/25/2021 1:30:30 PM By: Worthy Keeler PA-C Entered By: Worthy Keeler on 06/25/2021 13:30:30 [name] ([id number]) -------------------------------------------------------------------------------- SuperBill Details Patient Name: [name] Date of Service: 06/25/2021 Medical Record Number: [id number] Patient Account Number: [id number] Date of Birth/Sex: 09/29/1945 (75 y.o. F) Treating RN: Carlene Coria Primary Care Provider: [name] Other Clinician: Referring Provider: [name] Treating Provider/Extender: Skipper Cliche in Treatment: 22 Diagnosis Coding ICD-10 Codes Code Description I89.0 Lymphedema, not elsewhere classified I87.332 Chronic venous hypertension (idiopathic) with ulcer and inflammation of left lower extremity L97.828 Non-pressure chronic ulcer of other part of left lower leg with other specified severity Facility Procedures CPT4 Code: 92426834 Description: (Facility Use Only) 29581LT - [place] Modifier: Quantity: 1 Electronic Signature(s) Signed: 06/25/2021 1:52:43 PM By: Carlene Coria RN Entered By: Carlene Coria on 06/25/2021 13:52:43

## 2021-07-02 ENCOUNTER — Encounter: Payer: Medicare Other | Admitting: Physician Assistant

## 2021-07-02 ENCOUNTER — Other Ambulatory Visit: Payer: Self-pay

## 2021-07-02 DIAGNOSIS — I87332 Chronic venous hypertension (idiopathic) with ulcer and inflammation of left lower extremity: Secondary | ICD-10-CM | POA: Diagnosis not present

## 2021-07-02 NOTE — Progress Notes (Addendum)
[name], [name] ([id number]) Visit Report for 07/02/2021 Chief Complaint Document Details Patient Name: [name], [name] Date of Service: 07/02/2021 1:15 PM Medical Record Number: [id number] Patient Account Number: [id number] Date of Birth/Sex: 02-18-1946 (75 y.o. F) Treating RN: Carlene Coria Primary Care Provider: [name] Other Clinician: Referring Provider: [name] Treating Provider/Extender: Skipper Cliche in Treatment: 23 Information Obtained from: Patient Chief Complaint Left LE Ulcer Electronic Signature(s) Signed: 07/02/2021 1:50:30 PM By: Worthy Keeler PA-C Entered By: Worthy Keeler on 07/02/2021 13:50:30 [name] ([id number]) -------------------------------------------------------------------------------- HPI Details Patient Name: [name] Date of Service: 07/02/2021 1:15 PM Medical Record Number: [id number] Patient Account Number: [id number] Date of Birth/Sex: 02/18/1946 (75 y.o. F) Treating RN: Carlene Coria Primary Care Provider: [name] Other Clinician: Referring Provider: [name] Treating Provider/Extender: Skipper Cliche in Treatment: 23 History of Present Illness HPI Description: 03/15/2020 upon evaluation today patient presents today for initial evaluation here in our clinic concerning a wound that is on the left posterior lower extremity. Unfortunately this has been giving the patient some discomfort at this point which she notes has been affected her pretty much daily. The patient does have a history of venous insufficiency, hypertension, congestive heart failure, and a history of breast cancer. Fortunately there does not appear to be evidence of active infection at this time which is great news. No fevers, chills, nausea, vomiting, or diarrhea. Wound is not extremely large but does have some slough covering the surface of the wound. This is going require sharp debridement today. 03/15/2020 upon evaluation today patient  appears to be doing fairly well in regard to her wound. She does have some slough noted on the surface of the wound currently but I do believe the Iodoflex has been beneficial over the past week. There is no signs of active infection at this time which is great news and overall very pleased with the progress. No fevers, chills, nausea, vomiting, or diarrhea. 04/02/2020 upon evaluation today patient appears to be doing a little better in regard to her wound size wise this is not tremendously smaller she does have some slough buildup on the surface of the wound. With that being said this is going require some sharp debridement today to clear away some of the necrotic debris. Fortunately there is no evidence of active infection at this time. No fevers, chills, nausea, vomiting, or diarrhea. 04/10/2020 on evaluation today patient appears to be doing well with regard to her ulcer which is measuring somewhat smaller today. She actually has more granulation tissue noted which is also good news is no need for sharp debridement today. Fortunately there is no evidence of infection either which is also excellent. No fevers, chills, nausea, vomiting, or diarrhea. 04/26/2020 on evaluation today patient's wound actually is showing signs of good improvement which is great news. There does not appear to be any evidence of active infection. I do believe she is tolerating the collagen at this point. 05/03/2020 upon evaluation today patient's wound actually showed signs of good granulation at this time there does not appear to be any evidence of active infection which is great news and overall very pleased with where things stand. With that being said I do believe that the patient is can require some sharp debridement today but fortunately nothing too significant. 05/11/20 on evaluation today patient appears to be doing well in regard to her leg ulcer. She's been tolerating the dressing changes without complication.  Fortunately there is no signs of active infection at this time.  Overall I'm very pleased with where things stand. 05/18/2020 upon evaluation today patient's wound is showing signs of improvement is very dry and I think the collagen for the most part just dried to the wound bed. I am going to clean this away with sharp debridement in order to get this under control in my opinion. 05/25/2020 upon evaluation today patient actually appears to be doing well in regard to her leg ulcer. In fact upon inspection it appears she could potentially be completely healed but that is not guaranteed based on what we are seeing. There does not appear to be any signs of active infection which is great news. 05/31/2020 on evaluation today patient appears to be doing well with regard to her wounds. She in fact appears to be almost completely healed on the left leg there is just a very small opening remaining point 1 06/08/2020 upon evaluation today patient appears to be doing excellent in regard to her leg ulcer on the left in fact it appears to be that she is completely healed as of today. I see no signs of active infection at this time which is great news. No fevers, chills, nausea, vomiting, or diarrhea. READMISSION 01/16/2021 Patient that we have had in this clinic with a wound on the left posterior calf in the summer 2021 extending into October. She was felt to have chronic venous insufficiency. Per the patient's description she was discharged with what sounds like external compression stockings although she could not afford the "$75 per leg". She therefore did not get anything. Apparently her wound that she has currently is been in existence since January. She has been followed at vein and vascular with Unna boots and I think calcium alginate. She had ABIs done on 06/25/2020 that showed an noncompressible ABI on the right leg and the left leg but triphasic waveforms with good great toe pressures and a TBI of 0.90 on the  right and 0.91 on the left Surprisingly the patient also had venous reflux studies that were really unremarkable. This included no evidence of DVTs or SVTs but there was no evidence of deep venous insufficiency or superficial venous insufficiency in the greater saphenous or short saphenous veins bilaterally. I would wonder about more central venous issues or perhaps this is all lymphedema 01/25/2021 upon evaluation today patient appears to be doing decently well and guard to her wound. The good news is the Iodoflex that is job she saw Dr. Dellia Nims last week for readmission and to be honest I think that this has done extremely well over that week. I do believe that she can tolerate the sharp debridement today which will be good. I think that cleaning off the wound will likely allow [place] to be able to use a different type of dressing I do not think the Iodoflex will even be necessary based on how clean the wound looks. Fortunately there is no sign of active infection at this time which is great news. No fevers, chills, nausea, vomiting, or diarrhea. 02/12/2021 upon evaluation today patient appears to be doing well with regard to her leg ulcer. We did switch to alginate when she came for nurse visit I think is doing much better for her. Fortunately there does not appear to be any signs of active infection at this time which is great news. No fevers, chills, nausea, vomiting, or diarrhea. [name], [name] ([id number]) 02/18/2021 upon evaluation today patient's wound is actually showing signs of good improvement. I am very pleased with where things stand  currently. I do not see any signs of active infection which is great news and overall I feel like she is making good progress. The patient likewise is happy that things are doing so well and that this is measuring somewhat smaller. 02/25/2021 upon evaluation today patient actually appears to be doing quite well in regard to her wounds. She has been tolerating the  dressing changes without complication. Fortunately there does not appear to be any signs of active infection which is great news and overall I am extremely pleased with where things stand. No fevers, chills, nausea, vomiting, or diarrhea. She does have a lot of edema I really do think she needs compression socks to be worn in order to prevent things from causing additional issues for her currently. Especially on the right leg she should be wearing compression socks all the time to be honest. 03/04/2021 upon evaluation today patient's leg ulcer actually appears to be doing pretty well which is great news. There does not appear to be any significant change but overall the appearance is better even though the size is not necessarily reflecting a great improvement. Fortunately there does not appear to be any signs of active infection. No fevers, chills, nausea, vomiting, or diarrhea. 03/12/2021 upon evaluation today patient appears to be doing well with regard to her wounds. She has been tolerating the dressing changes without complication. The good news is that the wound on her left lateral leg is doing much better and is showing signs of good improvement. Overall her swelling is controlled well as well. She does need some compression socks she plans to go Jefferey socks next week. 03/18/2021 on evaluation today patient's wound actually is showing signs of good improvement. She does have a little bit of slough this can require some sharp debridement today. 03/25/2021 upon evaluation today patient appears to be doing well with regard to her wound on the left lateral leg. She has been tolerating the dressing changes without complication. Fortunately there does not appear to be any signs of active infection at this time. No fever chills no 04/01/2021 upon evaluation today patient's wound is showing signs of improvement and overall very pleased with where things stand at this point. There is no evidence of active  infection which is great news as well and in general I think that she is making great progress. 04/09/2021 upon evaluation today patient appears to be doing well with regard to her ankle ulcer. She is making good progress currently which is great news. There does not appear to be any signs of infection also excellent news. In general I am extremely pleased with where things stand today. No fevers, chills, nausea, vomiting, or diarrhea. 04/23/2021 upon inspection today patient appears to be doing quite well with regard to her leg ulcer. She has been tolerating the dressing changes without complication. She is can require some sharp debridement today but overall seems to be doing excellent. 05/06/2021 upon evaluation today patient unfortunately appears to be doing poorly overall in regard to her swelling. She is actually significantly more swollen than last week despite the fact that her wound is doing better this is not a good trend. I think that she probably needs to see her cardiologist ASAP and I discussed that with her today. This includes the fact that honestly I think she probably is getting need an increase in her diuretic or something to try to clear away some of the excess fluid that she is experiencing at the moment. She is  not been doing her pumps even twice a day but again right now more concerned that if she were to do the pump she would fluid overload her lungs causing other issues as far as her breathing is concerned. Obviously I think she needs to contact them and move up the appointment for the 12th of something sooner 2 weeks is a bit too long to wait with how swollen she has. 05/14/2021 upon evaluation today patient's wound actually showing signs of being a little bit smaller compared to previous. She has been making good progress however. Fortunately there does not appear to be any evidence of active infection which is great. Overall I am extremely pleased in that regard. Nonetheless I am  still concerned that she is having quite a bit of issue as far as the swelling is concerned she has been placed on fluid restriction as well as an increase in the furosemide by her doctor. We will see how things progress. 05/21/2021 upon evaluation today patient appears to be doing well with regard to her wound in general. She has been tolerating the dressing changes without complication. With that being said she has been using silver cell for a while and while this was showing signs of improvement it is now gotten to the point where this has slowed down quite significantly. I do believe that switching to a different dressing may be beneficial for her today. 05/28/2021 upon evaluation today patient actually appears to be doing quite well in regard to her wound. In fact there is really not any need for sharp debridement today based on what I see. The Hydrofera Blue is doing great and overall I think that she is managing quite nicely with the compression wrapping. Her edema is still down quite a bit with the wrapping in place. 06/11/2021 upon evaluation today patient appears to be doing well with regard to her leg ulcer. She has been tolerating the dressing changes without complication. Fortunately there does not appear to be any signs of active infection at this time which is great news. No fevers, chills, nausea, vomiting, or diarrhea. She is going require some sharp debridement today. 10/11; left leg ulcer much better looking and with improved surface area. She is using Hydrofera Blue under 3 layer compression. She does not have any stocking on the right leg which in itself has very significant nonpitting edema 06/25/2021 upon evaluation today patient appears to be doing well with regard to the wound on her left leg. She has been tolerating the dressing changes without complication and overall I am extremely pleased with where things stand today. I do not see any evidence of infection which is great  news. 07/02/2021 upon evaluation today patient's wound actually showing signs of excellent improvement and actually very pleased with where we stand today and the overall appearance. Fortunately there does not appear to be any signs of active infection. No fevers, chills, nausea, vomiting, or diarrhea. Electronic Signature(s) Signed: 07/02/2021 2:21:31 PM By: Worthy Keeler PA-C Entered By: Worthy Keeler on 07/02/2021 14:21:30 [name], [name] ([id number]) -------------------------------------------------------------------------------- Physical Exam Details Patient Name: [name] Date of Service: 07/02/2021 1:15 PM Medical Record Number: [id number] Patient Account Number: [id number] Date of Birth/Sex: [birth year] (75 y.o. F) Treating RN: Carlene Coria Primary Care Provider: [name] Other Clinician: Referring Provider: [name] Treating Provider/Extender: Skipper Cliche in Treatment: 34 Constitutional Well-nourished and well-hydrated in no acute distress. Respiratory normal breathing without difficulty. Psychiatric this patient is able to make decisions and demonstrates  good insight into disease process. Alert and Oriented x 3. pleasant and cooperative. Notes Upon inspection patient's wound bed showed signs of good granulation and epithelization at this point. Fortunately there does not appear to be any evidence of active infection systemically which is great news and overall I am extremely pleased with where we stand. Electronic Signature(s) Signed: 07/02/2021 2:21:48 PM By: Worthy Keeler PA-C Entered By: Worthy Keeler on 07/02/2021 14:21:48 [name] ([id number]) -------------------------------------------------------------------------------- Physician Orders Details Patient Name: [name] Date of Service: 07/02/2021 1:15 PM Medical Record Number: [id number] Patient Account Number: [id number] Date of Birth/Sex: 1946/05/27 (75 y.o.  F) Treating RN: Carlene Coria Primary Care Provider: [name] Other Clinician: Referring Provider: [name] Treating Provider/Extender: Skipper Cliche in Treatment: 23 Verbal / Phone Orders: No Diagnosis Coding ICD-10 Coding Code Description I89.0 Lymphedema, not elsewhere classified I87.332 Chronic venous hypertension (idiopathic) with ulcer and inflammation of left lower extremity L97.828 Non-pressure chronic ulcer of other part of left lower leg with other specified severity Follow-up Appointments o Return Appointment in 1 week. Bathing/ Shower/ Hygiene o May shower with wound dressing protected with water repellent cover or cast protector. Edema Control - Lymphedema / Segmental Compressive Device / Other Left Lower Extremity o Optional: One layer of unna paste to top of compression wrap (to act as an anchor). o Tubigrip single layer applied. - RIGHT LEG tubi grip applied "E" until you get your compression sock use this and it's hand washable o Patient to wear own compression stockings. Remove compression stockings every night before going to bed and put on every morning when getting up. - on RIGHT LEG call [facility] for compression sock 15-22 mm/Hg, measurements and phone number on blue sheet [employer] on [employer] in [place] also has the compression socks o Elevate, Exercise Daily and Avoid Standing for Long Periods of Time. o Elevate legs to the level of the heart and pump ankles as often as possible o Elevate leg(s) parallel to the floor when sitting. o Compression Pump: Use compression pump on left lower extremity for 60 minutes, twice daily. - You may pump over wraps o Compression Pump: Use compression pump on right lower extremity for 60 minutes, twice daily. - You may pump over wraps Additional Orders / Instructions Wound #2 Left,Lateral Lower Leg o Follow Nutritious Diet and Increase Protein Intake - Increase protein  intake. Wound Treatment Wound #2 - Lower Leg Wound Laterality: Left, Lateral Cleanser: Soap and Water 1 x Per Week/30 Days Discharge Instructions: Gently cleanse wound with antibacterial soap, rinse and pat dry prior to dressing wounds Primary Dressing: Hydrofera Blue Ready Transfer Foam, 2.5x2.5 (in/in) 1 x Per Week/30 Days Discharge Instructions: Apply Hydrofera Blue Ready to wound bed as directed Secondary Dressing: ABD Pad 5x9 (in/in) 1 x Per Week/30 Days Discharge Instructions: Cover with ABD pad Compression Wrap: Profore Lite LF 3 Multilayer Compression Bandaging System 1 x Per Week/30 Days Discharge Instructions: Apply 3 multi-layer wrap as prescribed. Electronic Signature(s) Signed: 07/02/2021 4:18:25 PM By: Carlene Coria RN Signed: 07/03/2021 4:54:49 PM By: Worthy Keeler PA-C Entered By: Carlene Coria on 07/02/2021 14:17:59 [name] ([id number]) -------------------------------------------------------------------------------- Problem List Details Patient Name: [name] Date of Service: 07/02/2021 1:15 PM Medical Record Number: [id number] Patient Account Number: [id number] Date of Birth/Sex: 1946/06/28 (75 y.o. F) Treating RN: Carlene Coria Primary Care Provider: [name] Other Clinician: Referring Provider: [name] Treating Provider/Extender: Skipper Cliche in Treatment: 23 Active Problems ICD-10 Encounter Code Description Active Date  MDM Diagnosis I89.0 Lymphedema, not elsewhere classified 01/16/2021 No Yes I87.332 Chronic venous hypertension (idiopathic) with ulcer and inflammation of 01/16/2021 No Yes left lower extremity L97.828 Non-pressure chronic ulcer of other part of left lower leg with other 01/16/2021 No Yes specified severity Inactive Problems Resolved Problems Electronic Signature(s) Signed: 07/02/2021 1:50:24 PM By: Worthy Keeler PA-C Entered By: Worthy Keeler on 07/02/2021 13:50:23 [name]  ([id number]) -------------------------------------------------------------------------------- Progress Note Details Patient Name: [name] Date of Service: 07/02/2021 1:15 PM Medical Record Number: [id number] Patient Account Number: [id number] Date of Birth/Sex: 12/23/1945 (75 y.o. F) Treating RN: Carlene Coria Primary Care Provider: [name] Other Clinician: Referring Provider: [name] Treating Provider/Extender: Skipper Cliche in Treatment: 23 Subjective Chief Complaint Information obtained from Patient Left LE Ulcer History of Present Illness (HPI) 03/15/2020 upon evaluation today patient presents today for initial evaluation here in our clinic concerning a wound that is on the left posterior lower extremity. Unfortunately this has been giving the patient some discomfort at this point which she notes has been affected her pretty much daily. The patient does have a history of venous insufficiency, hypertension, congestive heart failure, and a history of breast cancer. Fortunately there does not appear to be evidence of active infection at this time which is great news. No fevers, chills, nausea, vomiting, or diarrhea. Wound is not extremely large but does have some slough covering the surface of the wound. This is going require sharp debridement today. 03/15/2020 upon evaluation today patient appears to be doing fairly well in regard to her wound. She does have some slough noted on the surface of the wound currently but I do believe the Iodoflex has been beneficial over the past week. There is no signs of active infection at this time which is great news and overall very pleased with the progress. No fevers, chills, nausea, vomiting, or diarrhea. 04/02/2020 upon evaluation today patient appears to be doing a little better in regard to her wound size wise this is not tremendously smaller she does have some slough buildup on the surface of the wound. With that being said this is  going require some sharp debridement today to clear away some of the necrotic debris. Fortunately there is no evidence of active infection at this time. No fevers, chills, nausea, vomiting, or diarrhea. 04/10/2020 on evaluation today patient appears to be doing well with regard to her ulcer which is measuring somewhat smaller today. She actually has more granulation tissue noted which is also good news is no need for sharp debridement today. Fortunately there is no evidence of infection either which is also excellent. No fevers, chills, nausea, vomiting, or diarrhea. 04/26/2020 on evaluation today patient's wound actually is showing signs of good improvement which is great news. There does not appear to be any evidence of active infection. I do believe she is tolerating the collagen at this point. 05/03/2020 upon evaluation today patient's wound actually showed signs of good granulation at this time there does not appear to be any evidence of active infection which is great news and overall very pleased with where things stand. With that being said I do believe that the patient is can require some sharp debridement today but fortunately nothing too significant. 05/11/20 on evaluation today patient appears to be doing well in regard to her leg ulcer. She's been tolerating the dressing changes without complication. Fortunately there is no signs of active infection at this time. Overall I'm very pleased with where things stand. 05/18/2020 upon  evaluation today patient's wound is showing signs of improvement is very dry and I think the collagen for the most part just dried to the wound bed. I am going to clean this away with sharp debridement in order to get this under control in my opinion. 05/25/2020 upon evaluation today patient actually appears to be doing well in regard to her leg ulcer. In fact upon inspection it appears she could potentially be completely healed but that is not guaranteed based on what we  are seeing. There does not appear to be any signs of active infection which is great news. 05/31/2020 on evaluation today patient appears to be doing well with regard to her wounds. She in fact appears to be almost completely healed on the left leg there is just a very small opening remaining point 1 06/08/2020 upon evaluation today patient appears to be doing excellent in regard to her leg ulcer on the left in fact it appears to be that she is completely healed as of today. I see no signs of active infection at this time which is great news. No fevers, chills, nausea, vomiting, or diarrhea. READMISSION 01/16/2021 Patient that we have had in this clinic with a wound on the left posterior calf in the summer 2021 extending into October. She was felt to have chronic venous insufficiency. Per the patient's description she was discharged with what sounds like external compression stockings although she could not afford the "$75 per leg". She therefore did not get anything. Apparently her wound that she has currently is been in existence since January. She has been followed at vein [facility] vascular with Unna boots and I think calcium alginate. She had ABIs done on 06/25/2020 that showed an noncompressible ABI on the right leg and the left leg but triphasic waveforms with good great toe pressures and a TBI of 0.90 on the right and 0.91 on the left Surprisingly the patient also had venous reflux studies that were really unremarkable. This included no evidence of DVTs or SVTs but there was no evidence of deep venous insufficiency or superficial venous insufficiency in the greater saphenous or short saphenous veins bilaterally. I would wonder about more central venous issues or perhaps this is all lymphedema 01/25/2021 upon evaluation today patient appears to be doing decently well and guard to her wound. The good news is the Iodoflex that is job she saw Dr. Dellia Nims last week for readmission and to be honest I think  that this has done extremely well over that week. I do believe that she can tolerate the sharp debridement today which will be good. I think that cleaning off the wound will likely allow [place] to be able to use a different type of dressing I do not think the Iodoflex will even be necessary based on how clean the wound looks. Fortunately there is no sign of active infection at this time which is great news. No fevers, chills, nausea, vomiting, or diarrhea. [name], [name] ([id number]) 02/12/2021 upon evaluation today patient appears to be doing well with regard to her leg ulcer. We did switch to alginate when she came for nurse visit I think is doing much better for her. Fortunately there does not appear to be any signs of active infection at this time which is great news. No fevers, chills, nausea, vomiting, or diarrhea. 02/18/2021 upon evaluation today patient's wound is actually showing signs of good improvement. I am very pleased with where things stand currently. I do not see any signs of active  infection which is great news and overall I feel like she is making good progress. The patient likewise is happy that things are doing so well and that this is measuring somewhat smaller. 02/25/2021 upon evaluation today patient actually appears to be doing quite well in regard to her wounds. She has been tolerating the dressing changes without complication. Fortunately there does not appear to be any signs of active infection which is great news and overall I am extremely pleased with where things stand. No fevers, chills, nausea, vomiting, or diarrhea. She does have a lot of edema I really do think she needs compression socks to be worn in order to prevent things from causing additional issues for her currently. Especially on the right leg she should be wearing compression socks all the time to be honest. 03/04/2021 upon evaluation today patient's leg ulcer actually appears to be doing pretty well which is  great news. There does not appear to be any significant change but overall the appearance is better even though the size is not necessarily reflecting a great improvement. Fortunately there does not appear to be any signs of active infection. No fevers, chills, nausea, vomiting, or diarrhea. 03/12/2021 upon evaluation today patient appears to be doing well with regard to her wounds. She has been tolerating the dressing changes without complication. The good news is that the wound on her left lateral leg is doing much better and is showing signs of good improvement. Overall her swelling is controlled well as well. She does need some compression socks she plans to go Jefferey socks next week. 03/18/2021 on evaluation today patient's wound actually is showing signs of good improvement. She does have a little bit of slough this can require some sharp debridement today. 03/25/2021 upon evaluation today patient appears to be doing well with regard to her wound on the left lateral leg. She has been tolerating the dressing changes without complication. Fortunately there does not appear to be any signs of active infection at this time. No fever chills no 04/01/2021 upon evaluation today patient's wound is showing signs of improvement and overall very pleased with where things stand at this point. There is no evidence of active infection which is great news as well and in general I think that she is making great progress. 04/09/2021 upon evaluation today patient appears to be doing well with regard to her ankle ulcer. She is making good progress currently which is great news. There does not appear to be any signs of infection also excellent news. In general I am extremely pleased with where things stand today. No fevers, chills, nausea, vomiting, or diarrhea. 04/23/2021 upon inspection today patient appears to be doing quite well with regard to her leg ulcer. She has been tolerating the dressing changes without  complication. She is can require some sharp debridement today but overall seems to be doing excellent. 05/06/2021 upon evaluation today patient unfortunately appears to be doing poorly overall in regard to her swelling. She is actually significantly more swollen than last week despite the fact that her wound is doing better this is not a good trend. I think that she probably needs to see her cardiologist ASAP and I discussed that with her today. This includes the fact that honestly I think she probably is getting need an increase in her diuretic or something to try to clear away some of the excess fluid that she is experiencing at the moment. She is not been doing her pumps even twice a day  but again right now more concerned that if she were to do the pump she would fluid overload her lungs causing other issues as far as her breathing is concerned. Obviously I think she needs to contact them and move up the appointment for the 12th of something sooner 2 weeks is a bit too long to wait with how swollen she has. 05/14/2021 upon evaluation today patient's wound actually showing signs of being a little bit smaller compared to previous. She has been making good progress however. Fortunately there does not appear to be any evidence of active infection which is great. Overall I am extremely pleased in that regard. Nonetheless I am still concerned that she is having quite a bit of issue as far as the swelling is concerned she has been placed on fluid restriction as well as an increase in the furosemide by her doctor. We will see how things progress. 05/21/2021 upon evaluation today patient appears to be doing well with regard to her wound in general. She has been tolerating the dressing changes without complication. With that being said she has been using silver cell for a while and while this was showing signs of improvement it is now gotten to the point where this has slowed down quite significantly. I do  believe that switching to a different dressing may be beneficial for her today. 05/28/2021 upon evaluation today patient actually appears to be doing quite well in regard to her wound. In fact there is really not any need for sharp debridement today based on what I see. The Hydrofera Blue is doing great and overall I think that she is managing quite nicely with the compression wrapping. Her edema is still down quite a bit with the wrapping in place. 06/11/2021 upon evaluation today patient appears to be doing well with regard to her leg ulcer. She has been tolerating the dressing changes without complication. Fortunately there does not appear to be any signs of active infection at this time which is great news. No fevers, chills, nausea, vomiting, or diarrhea. She is going require some sharp debridement today. 10/11; left leg ulcer much better looking and with improved surface area. She is using Hydrofera Blue under 3 layer compression. She does not have any stocking on the right leg which in itself has very significant nonpitting edema 06/25/2021 upon evaluation today patient appears to be doing well with regard to the wound on her left leg. She has been tolerating the dressing changes without complication and overall I am extremely pleased with where things stand today. I do not see any evidence of infection which is great news. 07/02/2021 upon evaluation today patient's wound actually showing signs of excellent improvement and actually very pleased with where we stand today and the overall appearance. Fortunately there does not appear to be any signs of active infection. No fevers, chills, nausea, vomiting, or diarrhea. [name], [name] ([id number]) Objective Constitutional Well-nourished and well-hydrated in no acute distress. Vitals Time Taken: 1:47 PM, Height: 69 in, Weight: 244 lbs, BMI: 36, Temperature: 98.6 F, Pulse: 66 bpm, Respiratory Rate: 16 breaths/min, Blood Pressure: 171/79  mmHg. Respiratory normal breathing without difficulty. Psychiatric this patient is able to make decisions and demonstrates good insight into disease process. Alert and Oriented x 3. pleasant and cooperative. General Notes: Upon inspection patient's wound bed showed signs of good granulation and epithelization at this point. Fortunately there does not appear to be any evidence of active infection systemically which is great news and overall I am extremely  pleased with where we stand. Integumentary (Hair, Skin) Wound #2 status is Open. Original cause of wound was Gradually Appeared. The date acquired was: 09/08/2020. The wound has been in treatment 23 weeks. The wound is located on the Left,Lateral Lower Leg. The wound measures 1.5cm length x 0.7cm width x 0.1cm depth; 0.825cm^2 area and 0.082cm^3 volume. There is Fat Layer (Subcutaneous Tissue) exposed. There is no tunneling or undermining noted. There is a medium amount of serosanguineous drainage noted. There is large (67-100%) red granulation within the wound bed. There is no necrotic tissue within the wound bed. 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 There was a Three Layer Compression Therapy Procedure by Carlene Coria, RN. Post procedure Diagnosis Wound #2: Same as Pre-Procedure Plan Follow-up Appointments: Return Appointment in 1 week. Bathing/ Shower/ Hygiene: May shower with wound dressing protected with water repellent cover or cast protector. Edema Control - Lymphedema / Segmental Compressive Device / Other: Optional: One layer of unna paste to top of compression wrap (to act as an anchor). Tubigrip  single layer applied. - RIGHT LEG tubi grip applied "E" until you get your compression sock use this and it's hand washable Patient to wear own compression stockings. Remove compression stockings every night before going to bed and put on every morning when getting up. - on RIGHT LEG call [facility] for compression sock 15-22 mm/Hg, measurements and phone number on blue sheet [employer] on [employer] in [place] also has the compression socks Elevate, Exercise Daily and Avoid Standing for Long Periods of Time. Elevate legs to the level of the heart and pump ankles as often as possible Elevate leg(s) parallel to the floor when sitting. Compression Pump: Use compression pump on left lower extremity for 60 minutes, twice daily. - You may pump over wraps Atkins, Clarann ([id number]) Compression Pump: Use compression pump on right lower extremity for 60 minutes, twice daily. - You may pump over wraps Additional Orders / Instructions: Wound #2 Left,Lateral Lower Leg: Follow Nutritious Diet and Increase Protein Intake - Increase protein intake. WOUND #2: - Lower Leg Wound Laterality: Left, Lateral Cleanser: Soap and Water 1 x Per Week/30 Days Discharge Instructions: Gently cleanse wound with antibacterial soap, rinse and pat dry prior to dressing wounds Primary Dressing: Hydrofera Blue Ready Transfer Foam, 2.5x2.5 (in/in) 1 x Per Week/30 Days Discharge Instructions: Apply Hydrofera Blue Ready to wound bed as directed Secondary Dressing: ABD Pad 5x9 (in/in) 1 x Per Week/30 Days Discharge Instructions: Cover with ABD pad Compression Wrap: Profore Lite LF 3 Multilayer Compression Bandaging System 1 x Per Week/30 Days Discharge Instructions: Apply 3 multi-layer wrap as prescribed. 1. Would recommend currently that we continue with wound care measures as before no sharp debridement was necessary and overall I think that we are headed in the right direction from the standpoint of the  dressings. We are using Hydrofera Blue. 2. I am also can recommend that we go ahead and continue with the 3 layer compression wrap to the left leg which is doing a good job keeping edema under good control. We will see patient back for reevaluation in 1 week here in the clinic. If anything worsens or changes patient will contact our office for additional recommendations.  Electronic Signature(s) Signed: 07/02/2021 2:22:16 PM By: Worthy Keeler PA-C Entered By: Worthy Keeler on 07/02/2021 14:22:15 [name] ([id number]) -------------------------------------------------------------------------------- SuperBill Details Patient Name: [name] Date of Service: 07/02/2021 Medical Record Number: [id number] Patient Account Number: [id number] Date of Birth/Sex: 07-Oct-1945 (75 y.o. F) Treating RN: Carlene Coria Primary Care Provider: [name] Other Clinician: Referring Provider: [name] Treating Provider/Extender: Skipper Cliche in Treatment: 23 Diagnosis Coding ICD-10 Codes Code Description I89.0 Lymphedema, not elsewhere classified I87.332 Chronic venous hypertension (idiopathic) with ulcer and inflammation of left lower extremity L97.828 Non-pressure chronic ulcer of other part of left lower leg with other specified severity Facility Procedures CPT4 Code: [id number] Description: (Facility Use Only) 29581LT - [place] LWR LT LEG Modifier: Quantity: 1 Physician Procedures CPT4 Code Description: [id number] - WC PHYS LEVEL 3 - EST PT Modifier: Quantity: 1 CPT4 Code Description: ICD-10 Diagnosis Description I89.0 Lymphedema, not elsewhere classified I87.332 Chronic venous hypertension (idiopathic) with ulcer and inflammation of le L97.828 Non-pressure chronic ulcer of other part of left lower leg with  other spec Modifier: ft lower extremity ified severity Quantity: Electronic Signature(s) Signed: 07/02/2021 2:22:27 PM By: Worthy Keeler  PA-C Entered By: Worthy Keeler on 07/02/2021 14:22:27

## 2021-07-02 NOTE — Progress Notes (Signed)
PADME, ARRIAGA ([id number]) Visit Report for 07/02/2021 Arrival Information Details Patient Name: [name], [name] Date of Service: 07/02/2021 1:15 PM Medical Record Number: [id number] Patient Account Number: [id number] Date of Birth/Sex: 12-Jul-1946 (75 y.o. F) Treating RN: Carlene Coria Primary Care Woodrow Drab: [name] Other Clinician: Referring Ambree Frances: [name] Treating Olanda Boughner/Extender: Skipper Cliche in Treatment: 23 Visit Information History Since Last Visit All ordered tests and consults were completed: No Patient Arrived: Wheel Chair Added or deleted any medications: No Arrival Time: 13:40 Any new allergies or adverse reactions: No Accompanied By: self Had a fall or experienced change in No Transfer Assistance: None activities of daily living that may affect Patient Identification Verified: Yes risk of falls: Secondary Verification Process Completed: Yes Signs or symptoms of abuse/neglect since last visito No Patient Requires Transmission-Based Precautions: No Hospitalized since last visit: No Patient Has Alerts: Yes Implantable device outside of the clinic excluding No Patient Alerts: NOT DIABETIC cellular tissue based products placed in the center since last visit: Has Dressing in Place as Prescribed: Yes Has Compression in Place as Prescribed: Yes Pain Present Now: No Electronic Signature(s) Signed: 07/02/2021 4:18:25 PM By: Carlene Coria RN Entered By: Carlene Coria on 07/02/2021 13:46:42 [name] ([id number]) -------------------------------------------------------------------------------- Clinic Level of Care Assessment Details Patient Name: [name] Date of Service: 07/02/2021 1:15 PM Medical Record Number: [id number] Patient Account Number: [id number] Date of Birth/Sex: Mar 15, 1946 (75 y.o. F) Treating RN: Carlene Coria Primary Care Anglia Blakley: [name] Other Clinician: Referring Chancellor Vanderloop: [name] Treating  Karalynn Cottone/Extender: Skipper Cliche in Treatment: 23 Clinic Level of Care Assessment Items TOOL 4 Quantity Score []  - Use when only an EandM is performed on FOLLOW-UP visit 0 ASSESSMENTS - Nursing Assessment / Reassessment []  - Reassessment of Co-morbidities (includes updates in patient status) 0 []  - 0 Reassessment of Adherence to Treatment Plan ASSESSMENTS - Wound and Skin Assessment / Reassessment []  - Simple Wound Assessment / Reassessment - one wound 0 []  - 0 Complex Wound Assessment / Reassessment - multiple wounds []  - 0 Dermatologic / Skin Assessment (not related to wound area) ASSESSMENTS - Focused Assessment []  - Circumferential Edema Measurements - multi extremities 0 []  - 0 Nutritional Assessment / Counseling / Intervention []  - 0 Lower Extremity Assessment (monofilament, tuning fork, pulses) []  - 0 Peripheral Arterial Disease Assessment (using hand held doppler) ASSESSMENTS - Ostomy and/or Continence Assessment and Care []  - Incontinence Assessment and Management 0 []  - 0 Ostomy Care Assessment and Management (repouching, etc.) PROCESS - Coordination of Care []  - Simple Patient / Family Education for ongoing care 0 []  - 0 Complex (extensive) Patient / Family Education for ongoing care []  - 0 Staff obtains Programmer, systems, Records, Test Results / Process Orders []  - 0 Staff telephones HHA, Nursing Homes / Clarify orders / etc []  - 0 Routine Transfer to another Facility (non-emergent condition) []  - 0 Routine Hospital Admission (non-emergent condition) []  - 0 New Admissions / Biomedical engineer / Ordering NPWT, Apligraf, etc. []  - 0 Emergency Hospital Admission (emergent condition) []  - 0 Simple Discharge Coordination []  - 0 Complex (extensive) Discharge Coordination PROCESS - Special Needs []  - Pediatric / Minor Patient Management 0 []  - 0 Isolation Patient Management []  - 0 Hearing / Language / Visual special needs []  - 0 Assessment of Community  assistance (transportation, D/C planning, etc.) []  - 0 Additional assistance / Altered mentation []  - 0 Support Surface(s) Assessment (bed, cushion, seat, etc.) INTERVENTIONS - Wound Cleansing / Measurement Pant, Whitni ([id number]) []  - 0 Simple  Wound Cleansing - one wound []  - 0 Complex Wound Cleansing - multiple wounds []  - 0 Wound Imaging (photographs - any number of wounds) []  - 0 Wound Tracing (instead of photographs) []  - 0 Simple Wound Measurement - one wound []  - 0 Complex Wound Measurement - multiple wounds INTERVENTIONS - Wound Dressings []  - Small Wound Dressing one or multiple wounds 0 []  - 0 Medium Wound Dressing one or multiple wounds []  - 0 Large Wound Dressing one or multiple wounds []  - 0 Application of Medications - topical []  - 0 Application of Medications - injection INTERVENTIONS - Miscellaneous []  - External ear exam 0 []  - 0 Specimen Collection (cultures, biopsies, blood, body fluids, etc.) []  - 0 Specimen(s) / Culture(s) sent or taken to Lab for analysis []  - 0 Patient Transfer (multiple staff / Civil Service fast streamer / Similar devices) []  - 0 Simple Staple / Suture removal (25 or less) []  - 0 Complex Staple / Suture removal (26 or more) []  - 0 Hypo / Hyperglycemic Management (close monitor of Blood Glucose) []  - 0 Ankle / Brachial Index (ABI) - do not check if billed separately []  - 0 Vital Signs Has the patient been seen at the hospital within the last three years: Yes Total Score: 0 Level Of Care: ____ Electronic Signature(s) Signed: 07/02/2021 4:18:25 PM By: Carlene Coria RN Entered By: Carlene Coria on 07/02/2021 14:18:33 [name] ([id number]) -------------------------------------------------------------------------------- Compression Therapy Details Patient Name: [name] Date of Service: 07/02/2021 1:15 PM Medical Record Number: [id number] Patient Account Number: [id number] Date of Birth/Sex: 05/01/1946 (75 y.o.  F) Treating RN: Carlene Coria Primary Care Arieana Somoza: [name] Other Clinician: Referring Juanantonio Stolar: [name] Treating Josefine Fuhr/Extender: Skipper Cliche in Treatment: 23 Compression Therapy Performed for Wound Assessment: Wound #2 Left,Lateral Lower Leg Performed By: Clinician Carlene Coria, RN Compression Type: Three Layer Post Procedure Diagnosis Same as Pre-procedure Electronic Signature(s) Signed: 07/02/2021 4:18:25 PM By: Carlene Coria RN Entered By: Carlene Coria on 07/02/2021 14:17:18 [name] ([id number]) -------------------------------------------------------------------------------- Encounter Discharge Information Details Patient Name: [name] Date of Service: 07/02/2021 1:15 PM Medical Record Number: [id number] Patient Account Number: [id number] Date of Birth/Sex: 19-Mar-1946 (75 y.o. F) Treating RN: Carlene Coria Primary Care Candra Wegner: [name] Other Clinician: Referring Toniya Rozar: [name] Treating Porshea Janowski/Extender: Skipper Cliche in Treatment: 23 Encounter Discharge Information Items Discharge Condition: Stable Ambulatory Status: Wheelchair Discharge Destination: Home Transportation: Private Auto Accompanied By: self Schedule Follow-up Appointment: Yes Clinical Summary of Care: Patient Declined Electronic Signature(s) Signed: 07/02/2021 4:18:25 PM By: Carlene Coria RN Entered By: Carlene Coria on 07/02/2021 14:20:29 [name] ([id number]) -------------------------------------------------------------------------------- Lower Extremity Assessment Details Patient Name: [name] Date of Service: 07/02/2021 1:15 PM Medical Record Number: [id number] Patient Account Number: [id number] Date of Birth/Sex: 26-Jun-1946 (75 y.o. F) Treating RN: Carlene Coria Primary Care Jiovanny Burdell: [name] Other Clinician: Referring Alexandra Lipps: [name] Treating Anaiya Wisinski/Extender: Skipper Cliche in Treatment: 23 Edema  Assessment Assessed: [Left: No] [Right: No] Edema: [Left: Ye] [Right: s] Calf Left: Right: Point of Measurement: 27 cm From Medial Instep 37 cm Ankle Left: Right: Point of Measurement: 10 cm From Medial Instep 24 cm Vascular Assessment Pulses: Dorsalis Pedis Palpable: [Left:Yes] Electronic Signature(s) Signed: 07/02/2021 4:18:25 PM By: Carlene Coria RN Entered By: Carlene Coria on 07/02/2021 13:55:45 [name] ([id number]) -------------------------------------------------------------------------------- Multi Wound Chart Details Patient Name: [name] Date of Service: 07/02/2021 1:15 PM Medical Record Number: [id number] Patient Account Number: [id number] Date of Birth/Sex: [birth year] (75 y.o. F) Treating RN: Carlene Coria Primary Care Snow Peoples:  [name] Other Clinician: Referring Sanchez Hemmer: [name] Treating Cianna Kasparian/Extender: Skipper Cliche in Treatment: 23 Vital Signs Height(in): 69 Pulse(bpm): 66 Weight(lbs): 244 Blood Pressure(mmHg): 171/79 Body Mass Index(BMI): 36 Temperature(F): 98.6 Respiratory Rate(breaths/min): 16 Photos: [N/A:N/A] Wound Location: Left, Lateral Lower Leg N/A N/A Wounding Event: Gradually Appeared N/A N/A Primary Etiology: Venous Leg Ulcer N/A N/A Comorbid History: Cataracts, Lymphedema, Sleep N/A N/A Apnea, Congestive Heart Failure, Hypertension, Osteoarthritis, Neuropathy, Received Chemotherapy, Received Radiation Date Acquired: 09/08/2020 N/A N/A Weeks of Treatment: 23 N/A N/A Wound Status: Open N/A N/A Measurements L x W x D (cm) 1.5x0.7x0.1 N/A N/A Area (cm) : 0.825 N/A N/A Volume (cm) : 0.082 N/A N/A % Reduction in Area: 82.50% N/A N/A % Reduction in Volume: 82.60% N/A N/A Classification: Full Thickness Without Exposed N/A N/A Support Structures Exudate Amount: Medium N/A N/A Exudate Type: Serosanguineous N/A N/A Exudate Color: red, brown N/A N/A Granulation Amount: Large (67-100%) N/A N/A Granulation  Quality: Red N/A N/A Necrotic Amount: None Present (0%) N/A N/A Exposed Structures: Fat Layer (Subcutaneous Tissue): N/A N/A Yes Fascia: No Tendon: No Muscle: No Joint: No Bone: No Epithelialization: Small (1-33%) N/A N/A Treatment Notes Electronic Signature(s) Signed: 07/02/2021 4:18:25 PM By: Carlene Coria RN Entered By: Carlene Coria on 07/02/2021 14:16:57 [name] ([id number]) -------------------------------------------------------------------------------- Multi-Disciplinary Care Plan Details Patient Name: [name] Date of Service: 07/02/2021 1:15 PM Medical Record Number: [id number] Patient Account Number: [id number] Date of Birth/Sex: 1945/10/04 (75 y.o. F) Treating RN: Carlene Coria Primary Care Bralynn Velador: [name] Other Clinician: Referring Tara Wich: [name] Treating Tyland Klemens/Extender: Skipper Cliche in Treatment: 23 Active Inactive Wound/Skin Impairment Nursing Diagnoses: Impaired tissue integrity Goals: Patient/caregiver will verbalize understanding of skin care regimen Date Initiated: 01/16/2021 Date Inactivated: 02/18/2021 Target Resolution Date: 02/16/2021 Goal Status: Met Ulcer/skin breakdown will have a volume reduction of 30% by week 4 Date Initiated: 01/16/2021 Date Inactivated: 02/18/2021 Target Resolution Date: 02/16/2021 Goal Status: Met Ulcer/skin breakdown will have a volume reduction of 50% by week 8 Date Initiated: 01/16/2021 Date Inactivated: 04/01/2021 Target Resolution Date: 03/18/2021 Goal Status: Unmet Unmet Reason: comorbities Ulcer/skin breakdown will have a volume reduction of 80% by week 12 Date Initiated: 04/01/2021 Date Inactivated: 04/23/2021 Target Resolution Date: 04/18/2021 Goal Status: Unmet Unmet Reason: comorbities Ulcer/skin breakdown will heal within 14 weeks Date Initiated: 04/01/2021 Target Resolution Date: 05/19/2021 Goal Status: Active Interventions: Assess ulceration(s) every visit Treatment  Activities: Topical wound management initiated : 01/16/2021 Notes: Electronic Signature(s) Signed: 07/02/2021 4:18:25 PM By: Carlene Coria RN Entered By: Carlene Coria on 07/02/2021 14:16:22 [name] ([id number]) -------------------------------------------------------------------------------- Pain Assessment Details Patient Name: [name] Date of Service: 07/02/2021 1:15 PM Medical Record Number: [id number] Patient Account Number: [id number] Date of Birth/Sex: [birth year] (75 y.o. F) Treating RN: Carlene Coria Primary Care Dyron Kawano: [name] Other Clinician: Referring Violetta Lavalle: [name] Treating Sharmeka Palmisano/Extender: Skipper Cliche in Treatment: 23 Active Problems Location of Pain Severity and Description of Pain Patient Has Paino No Site Locations Pain Management and Medication Current Pain Management: Electronic Signature(s) Signed: 07/02/2021 4:18:25 PM By: Carlene Coria RN Entered By: Carlene Coria on 07/02/2021 13:48:15 [name] ([id number]) -------------------------------------------------------------------------------- Patient/Caregiver Education Details Patient Name: [name] Date of Service: 07/02/2021 1:15 PM Medical Record Number: [id number] Patient Account Number: [id number] Date of Birth/Gender: 1945/11/23 (75 y.o. F) Treating RN: Carlene Coria Primary Care Physician: [name] Other Clinician: Referring Physician: [name] Treating Physician/Extender: Skipper Cliche in Treatment: 33 Education Assessment Education Provided To: Patient Education Topics Provided Wound/Skin Impairment: Methods: Explain/Verbal Responses: State content correctly  Electronic Signature(s) Signed: 07/02/2021 4:18:25 PM By: Carlene Coria RN Entered By: Carlene Coria on 07/02/2021 14:19:43 [name] ([id number]) -------------------------------------------------------------------------------- Wound Assessment Details Patient Name:  [name] Date of Service: 07/02/2021 1:15 PM Medical Record Number: [id number] Patient Account Number: [id number] Date of Birth/Sex: [birth year] (75 y.o. F) Treating RN: Carlene Coria Primary Care Redith Drach: [name] Other Clinician: Referring Dalyn Becker: [name] Treating Maloni Musleh/Extender: Skipper Cliche in Treatment: 23 Wound Status Wound Number: 2 Primary Venous Leg Ulcer Etiology: Wound Location: Left, Lateral Lower Leg Wound Open Wounding Event: Gradually Appeared Status: Date Acquired: 09/08/2020 Comorbid Cataracts, Lymphedema, Sleep Apnea, Congestive Heart Weeks Of Treatment: 23 History: Failure, Hypertension, Osteoarthritis, Neuropathy, Clustered Wound: No Received Chemotherapy, Received Radiation Photos Wound Measurements Length: (cm) 1.5 Width: (cm) 0.7 Depth: (cm) 0.1 Area: (cm) 0.825 Volume: (cm) 0.082 % Reduction in Area: 82.5% % Reduction in Volume: 82.6% Epithelialization: Small (1-33%) Tunneling: No Undermining: No Wound Description Classification: Full Thickness Without Exposed Support Structu Exudate Amount: Medium Exudate Type: Serosanguineous Exudate Color: red, brown res Wound Bed Granulation Amount: Large (67-100%) Exposed Structure Granulation Quality: Red Fascia Exposed: No Necrotic Amount: None Present (0%) Fat Layer (Subcutaneous Tissue) Exposed: Yes Tendon Exposed: No Muscle Exposed: No Joint Exposed: No Bone Exposed: No Treatment Notes Wound #2 (Lower Leg) Wound Laterality: Left, Lateral Cleanser Soap and Water Discharge Instruction: Gently cleanse wound with antibacterial soap, rinse and pat dry prior to dressing wounds Peri-Wound Care KIEANA, LIVESAY ([id number]) Topical Primary Dressing Hydrofera Blue Ready Transfer Foam, 2.5x2.5 (in/in) Discharge Instruction: Apply Hydrofera Blue Ready to wound bed as directed Secondary Dressing ABD Pad 5x9 (in/in) Discharge Instruction: Cover with ABD pad Secured  With Compression Wrap Profore Lite LF 3 Multilayer Compression Bandaging System Discharge Instruction: Apply 3 multi-layer wrap as prescribed. Compression Stockings Add-Ons Electronic Signature(s) Signed: 07/02/2021 4:18:25 PM By: Carlene Coria RN Entered By: Carlene Coria on 07/02/2021 13:54:25 [name] ([id number]) -------------------------------------------------------------------------------- Vitals Details Patient Name: [name] Date of Service: 07/02/2021 1:15 PM Medical Record Number: [id number] Patient Account Number: [id number] Date of Birth/Sex: 1946/03/16 (75 y.o. F) Treating RN: Carlene Coria Primary Care Burma Ketcher: [name] Other Clinician: Referring Siddh Vandeventer: [name] Treating Neyla Gauntt/Extender: Skipper Cliche in Treatment: 23 Vital Signs Time Taken: 13:47 Temperature (F): 98.6 Height (in): 69 Pulse (bpm): 66 Weight (lbs): 244 Respiratory Rate (breaths/min): 16 Body Mass Index (BMI): 36 Blood Pressure (mmHg): 171/79 Reference Range: 80 - 120 mg / dl Electronic Signature(s) Signed: 07/02/2021 4:18:25 PM By: Carlene Coria RN Entered By: Carlene Coria on 07/02/2021 13:48:04

## 2021-07-04 ENCOUNTER — Other Ambulatory Visit: Payer: Self-pay

## 2021-07-04 ENCOUNTER — Ambulatory Visit (INDEPENDENT_AMBULATORY_CARE_PROVIDER_SITE_OTHER): Payer: Medicare Other | Admitting: Nurse Practitioner

## 2021-07-04 ENCOUNTER — Encounter: Payer: Self-pay | Admitting: Nurse Practitioner

## 2021-07-04 VITALS — BP 140/58 | HR 67 | Ht 69.0 in | Wt 243.5 lb

## 2021-07-04 DIAGNOSIS — I5022 Chronic systolic (congestive) heart failure: Secondary | ICD-10-CM | POA: Diagnosis not present

## 2021-07-04 DIAGNOSIS — I5032 Chronic diastolic (congestive) heart failure: Secondary | ICD-10-CM | POA: Diagnosis not present

## 2021-07-04 DIAGNOSIS — I10 Essential (primary) hypertension: Secondary | ICD-10-CM | POA: Diagnosis not present

## 2021-07-04 DIAGNOSIS — N183 Chronic kidney disease, stage 3 unspecified: Secondary | ICD-10-CM

## 2021-07-04 NOTE — Patient Instructions (Signed)
Medication Instructions:   Your physician recommends that you continue on your current medications as directed. Please refer to the Current Medication list given to you today.  *If you need a refill on your cardiac medications before your next appointment, please call your pharmacy*   Lab Work:  Your physician recommends that you have lab work today - BMP  If you have labs (blood work) drawn today and your tests are completely normal, you will receive your results only by: MyChart Message (if you have MyChart) OR A paper copy in the mail If you have any lab test that is abnormal or we need to change your treatment, we will call you to review the results.   Testing/Procedures:  None Ordered  Follow-Up: At [facility], you and your health needs are our priority.  As part of our continuing mission to provide you with exceptional heart care, we have created designated Provider Care Teams.  These Care Teams include your primary Cardiologist (physician) and Advanced Practice Providers (APPs -  Physician Assistants and Nurse Practitioners) who all work together to provide you with the care you need, when you need it.  We recommend signing up for the patient portal called "MyChart".  Sign up information is provided on this After Visit Summary.  MyChart is used to connect with patients for Virtual Visits (Telemedicine).  Patients are able to view lab/test results, encounter notes, upcoming appointments, etc.  Non-urgent messages can be sent to your provider as well.   To learn more about what you can do with MyChart, go to [email or web address].    Your next appointment:   1 month(s)  The format for your next appointment:   In Person  Provider:   You may see Ida Rogue or Murray Hodgkins, NP

## 2021-07-04 NOTE — Progress Notes (Signed)
Office Visit    Patient Name: [name] Date of Encounter: 07/04/2021  Primary Care Provider:  Donnie Coffin, MD Primary Cardiologist:  Ida Rogue, MD  Chief Complaint    75 y/o ? 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  Past Medical History    Past Medical History:  Diagnosis Date   Abnormality of gait    Breast cancer ([place]) 02/05/2005   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    Breast cancer ([place]) 2019   right breast   CHF (congestive heart failure) ([place])    March 28, 2017 echo: EF 50-55%; 1) echo 07/2011: EF 20-25%, mild concentric hypertrophy, diffuse HK, regional WMA cannot be excluded, grade 1 DD, mitral valve with mild regurg, LA mildly dilated). 2) EF 40-45%, mild AS,                     Complication of anesthesia 2006   pt states "hard to wake up" after L breast surgery    Edema    GERD (gastroesophageal reflux disease)    Headache    Heart murmur    Hypertension    Hypokinesis    global, EF 35-45 % from Echo.   Malignant neoplasm of breast (female), unspecified site 06/14/2018   RIGHT T1b, N0; ER/PR positive, HER-2 negative.   Neuropathy    Obesity    Pain in joint, site unspecified    Personal history of chemotherapy 2006   left breast   Personal history of radiation therapy 2019   right breast   Personal history of radiation therapy 2006   left breast   Pneumonia    Sleep apnea    Tinea pedis    Unspecified hearing loss    bilateral   Past Surgical History:  Procedure Laterality Date   BREAST BIOPSY Right 2019   11 oc-INVASIVE MAMMARY CARCINOMA, NO SPECIAL TYPE   BREAST BIOPSY Right 2019   1030 oc- neg   BREAST LUMPECTOMY  2006   left breast    CATARACT EXTRACTION     left eye   HERNIA REPAIR   05/25/2006   Incarcerated omentum in ventral hernia, Ventralight mesh, combined lap/ open with omental resection.    PARTIAL MASTECTOMY WITH NEEDLE LOCALIZATION Right 06/14/2018   Procedure: PARTIAL MASTECTOMY WITH NEEDLE LOCALIZATION;  Surgeon: Robert Bellow, MD;  Location: [facility] ORS;  Service: General;  Laterality: Right;   PORT-A-CATH REMOVAL  05/25/2006   SENTINEL NODE BIOPSY Right 06/14/2018   Procedure: SENTINEL NODE BIOPSY;  Surgeon: Robert Bellow, MD;  Location: [facility] OR[facility];  Service: General;  Laterality: Right;   TUBAL LIGATION      Allergies  No Known Allergies  History of Present Illness    75 y/o ? 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  She previously had systolic dysfunction with an EF of 25% in 2012 but repeat echo in July 2018 showed an EF of 50 to 55%.  [name] was seen in clinic on May 10, 2021 at which time her weight was up 15 pounds.  It was noted that she was often forgetting to take her p.m. dose of torsemide, and also not using her  compression hose/pumps.  With improved compliance, she did have some weight loss by clinic visit October 5, though it was felt that she would benefit from IV diuresis, which she declined.  Since then, she notes good medication compliance and stating that she is only missed 2 doses of torsemide in the past 3 weeks.  Her weight is down 11 pounds since her last visit.  She has been seen by wound care and her left leg is wrapped.  She has noted significant improvement in bilateral lower extremity edema.  She also notes compliance with compression pumps, now using twice a day for 1 hour.  She does not experience dyspnea on exertion but is also very sedentary.  She uses a scooter to get around her home in the setting of gait instability.  She denies chest pain, palpitations, PND, orthopnea, dizziness, syncope, or early  satiety.  Home Medications    Current Outpatient Medications  Medication Sig Dispense Refill   anastrozole (ARIMIDEX) 1 MG tablet TAKE 1 TABLET BY MOUTH DAILY. PATIENT MUST MAKE AN APPT TO SEE DR RAO 90 tablet 0   aspirin EC 81 MG tablet Take 81 mg by mouth daily.     cloNIDine (CATAPRES) 0.2 MG tablet Take 1 tablet (0.2 mg total) by mouth 2 (two) times daily. 60 tablet 11   clotrimazole (LOTRIMIN) 1 % cream Apply 1 application topically 2 (two) times daily.   1   [employer] CALCIUM 600 & VITAMIN D3 600-800 MG-UNIT TABS TAKE 1 TABLET BY MOUTH TWICE A DAY 180 tablet 2   diclofenac Sodium (VOLTAREN) 1 % GEL Apply 1 application topically as needed.     Incontinence Supply Disposable (BLADDER CONTROL PADS EX ABSORB) MISC Apply 1 Dose topically as needed.      isosorbide mononitrate (IMDUR) 60 MG 24 hr tablet Take 1 tablet (60 mg total) by mouth 2 (two) times daily. 180 tablet 3   loratadine (CLARITIN) 10 MG tablet Take 10 mg by mouth daily.     losartan (COZAAR) 25 MG tablet Take 25 mg by mouth daily.      methocarbamol (ROBAXIN) 500 MG tablet Take 500 mg by mouth 2 (two) times daily as needed.     MIRALAX 17 GM/SCOOP powder SMARTSIG:2 Scoopful By Mouth Daily PRN     omeprazole (PRILOSEC) 40 MG capsule Take 40 mg by mouth daily.     oxybutynin (DITROPAN) 5 MG tablet Take 5 mg by mouth 2 (two) times daily.     potassium chloride SA (K-DUR) 20 MEQ tablet Take 1 tablet (20 meq) by mouth once daily     Torsemide 40 MG TABS Take 40 mg by mouth 2 (two) times daily. Take in the am and after lunch for when weight is 240 lbs or greater. Take 40 mg daily when your weight is 225 or less 180 tablet 3   triamcinolone cream (KENALOG) 0.1 % Apply 1 application topically 2 (two) times daily. Back of legs     No current facility-administered medications for this visit.     Review of Systems    11 pound weight loss with improvement in lower extremity swelling.  She is an active and in that setting, denies chest  pain, dyspnea, palpitations, PND, orthopnea, dizziness, syncope, or early satiety.  All other systems reviewed and are otherwise negative except as noted above.  Physical Exam    VS:  BP (!) 140/58 (BP Location: Left Arm, Patient Position: Sitting, Cuff Size: Large)   Pulse 67     Ht 5' 9" (1.753 m)   Wt 243 lb 8 oz (110.5 kg)   SpO2 98%   BMI 35.96 kg/m  , BMI Body mass index is 35.96 kg/m.     GEN: Obese, in no acute distress. HEENT: normal. Neck: Supple, no JVD, carotid bruits, or masses. Cardiac: RRR, 2/6 systolic murmur heard throughout, no rubs or gallops. No clubbing, cyanosis.  1+ bilateral lower extremity edema.  The left lower leg is wrapped.  Radials 2+ and equal bilaterally.  Respiratory:  Respirations regular and unlabored, clear to auscultation bilaterally. GI: Obese, soft, nontender, nondistended, BS + x 4. MS: no deformity or atrophy. Skin: warm and dry, no rash. Neuro:  Strength and sensation are intact. Psych: Normal affect.  Accessory Clinical Findings    Lab Results  Component Value Date   WBC 5.7 02/22/2021   HGB 11.2 (L) 02/22/2021   HCT 35.2 (L) 02/22/2021   MCV 87.8 02/22/2021   PLT 273 02/22/2021   Lab Results  Component Value Date   CREATININE 1.20 (H) 02/22/2021   BUN 29 (H) 02/22/2021   NA 139 02/22/2021   K 4.0 02/22/2021   CL 101 02/22/2021   CO2 29 02/22/2021   Lab Results  Component Value Date   ALT 15 02/22/2021   AST 18 02/22/2021   ALKPHOS 55 02/22/2021   BILITOT 0.6 02/22/2021   Lab Results  Component Value Date   CHOL 141 03/28/2017   HDL 42 03/28/2017   LDLCALC 91 03/28/2017   TRIG 40 03/28/2017   CHOLHDL 3.4 03/28/2017    Lab Results  Component Value Date   HGBA1C 5.8 (H) 03/28/2017    Assessment & Plan    1.  Chronic heart failure with preserved ejection fraction: EF 50 to 55% by echo in July 2018.  Recently evaluated in the setting of worsening lower extremity edema, weight gain, and noncompliance.  With  compliance with diuretic therapy and compression pumps, which she is now using twice a day x1 hour, she has had 11 pound weight loss since her last visit and significant improvement in lower extremity swelling.  She is also been seen by wound clinic in her left lower extremity is wrapped.  I am going to follow-up a basic metabolic panel today.  Otherwise, she will continue torsemide 40 mg twice daily and compression pumps.  Her blood pressure is elevated currently however, she notes that she did not take her losartan this morning and plans to do so when she gets home.  2.  Essential hypertension: As above, blood pressure elevated currently but she did not take her losartan this morning.  In addition to losartan, she takes clonidine, isosorbide, and torsemide.  3.  Stage II-III chronic kidney disease: GFR was 42 in June.  Greater than 60 prior to that.  We will follow-up basic metabolic panel today given ongoing diuresis and weight loss.  4.  Disposition: Follow-up basic metabolic panel today.  Follow-up in clinic in 1 month or sooner if necessary.   Murray Hodgkins, NP 07/04/2021, 5:55 PM

## 2021-07-05 LAB — BASIC METABOLIC PANEL
BUN/Creatinine Ratio: 23 (ref 12–28)
BUN: 27 mg/dL (ref 8–27)
CO2: 25 mmol/L (ref 20–29)
Calcium: 9.1 mg/dL (ref 8.7–10.3)
Chloride: 103 mmol/L (ref 96–106)
Creatinine, Ser: 1.16 mg/dL — ABNORMAL HIGH (ref 0.57–1.00)
Glucose: 205 mg/dL — ABNORMAL HIGH (ref 70–99)
Potassium: 3.8 mmol/L (ref 3.5–5.2)
Sodium: 142 mmol/L (ref 134–144)
eGFR: 49 mL/min/{1.73_m2} — ABNORMAL LOW (ref >59)

## 2021-07-08 ENCOUNTER — Other Ambulatory Visit: Payer: Self-pay | Admitting: Oncology

## 2021-07-09 ENCOUNTER — Other Ambulatory Visit: Payer: Self-pay

## 2021-07-09 ENCOUNTER — Encounter: Payer: Medicare Other | Attending: Physician Assistant | Admitting: Physician Assistant

## 2021-07-09 DIAGNOSIS — I87332 Chronic venous hypertension (idiopathic) with ulcer and inflammation of left lower extremity: Secondary | ICD-10-CM | POA: Insufficient documentation

## 2021-07-09 DIAGNOSIS — I890 Lymphedema, not elsewhere classified: Secondary | ICD-10-CM | POA: Diagnosis not present

## 2021-07-09 DIAGNOSIS — L97828 Non-pressure chronic ulcer of other part of left lower leg with other specified severity: Secondary | ICD-10-CM | POA: Insufficient documentation

## 2021-07-09 NOTE — Progress Notes (Addendum)
[name] ([id number]) Visit Report for 07/09/2021 Chief Complaint Document Details Patient Name: [name] Date of Service: 07/09/2021 2:15 PM Medical Record Number: [id number] Patient Account Number: [id number] Date of Birth/Sex: [birth year] (75 y.o. F) Treating RN: Carlene Coria Primary Care Provider: [name] Other Clinician: Referring Provider: [name] Treating Provider/Extender: Skipper Cliche in Treatment: 24 Information Obtained from: Patient Chief Complaint Left LE Ulcer Electronic Signature(s) Signed: 07/09/2021 2:56:34 PM By: Worthy Keeler PA-C Entered By: Worthy Keeler on 07/09/2021 14:56:33 [name] ([id number]) -------------------------------------------------------------------------------- HPI Details Patient Name: [name] Date of Service: 07/09/2021 2:15 PM Medical Record Number: [id number] Patient Account Number: [id number] Date of Birth/Sex: 1945/10/10 (75 y.o. F) Treating RN: Carlene Coria Primary Care Provider: [name] Other Clinician: Referring Provider: [name] Treating Provider/Extender: Skipper Cliche in Treatment: 24 History of Present Illness HPI Description: 03/15/2020 upon evaluation today patient presents today for initial evaluation here in our clinic concerning a wound that is on the left posterior lower extremity. Unfortunately this has been giving the patient some discomfort at this point which she notes has been affected her pretty much daily. The patient does have a history of venous insufficiency, hypertension, congestive heart failure, and a history of breast cancer. Fortunately there does not appear to be evidence of active infection at this time which is great news. No fevers, chills, nausea, vomiting, or diarrhea. Wound is not extremely large but does have some slough covering the surface of the wound. This is going require sharp debridement today. 03/15/2020 upon evaluation today patient  appears to be doing fairly well in regard to her wound. She does have some slough noted on the surface of the wound currently but I do believe the Iodoflex has been beneficial over the past week. There is no signs of active infection at this time which is great news and overall very pleased with the progress. No fevers, chills, nausea, vomiting, or diarrhea. 04/02/2020 upon evaluation today patient appears to be doing a little better in regard to her wound size wise this is not tremendously smaller she does have some slough buildup on the surface of the wound. With that being said this is going require some sharp debridement today to clear away some of the necrotic debris. Fortunately there is no evidence of active infection at this time. No fevers, chills, nausea, vomiting, or diarrhea. 04/10/2020 on evaluation today patient appears to be doing well with regard to her ulcer which is measuring somewhat smaller today. She actually has more granulation tissue noted which is also good news is no need for sharp debridement today. Fortunately there is no evidence of infection either which is also excellent. No fevers, chills, nausea, vomiting, or diarrhea. 04/26/2020 on evaluation today patient's wound actually is showing signs of good improvement which is great news. There does not appear to be any evidence of active infection. I do believe she is tolerating the collagen at this point. 05/03/2020 upon evaluation today patient's wound actually showed signs of good granulation at this time there does not appear to be any evidence of active infection which is great news and overall very pleased with where things stand. With that being said I do believe that the patient is can require some sharp debridement today but fortunately nothing too significant. 05/11/20 on evaluation today patient appears to be doing well in regard to her leg ulcer. She's been tolerating the dressing changes without complication.  Fortunately there is no signs of active infection at this time.  Overall I'm very pleased with where things stand. 05/18/2020 upon evaluation today patient's wound is showing signs of improvement is very dry and I think the collagen for the most part just dried to the wound bed. I am going to clean this away with sharp debridement in order to get this under control in my opinion. 05/25/2020 upon evaluation today patient actually appears to be doing well in regard to her leg ulcer. In fact upon inspection it appears she could potentially be completely healed but that is not guaranteed based on what we are seeing. There does not appear to be any signs of active infection which is great news. 05/31/2020 on evaluation today patient appears to be doing well with regard to her wounds. She in fact appears to be almost completely healed on the left leg there is just a very small opening remaining point 1 06/08/2020 upon evaluation today patient appears to be doing excellent in regard to her leg ulcer on the left in fact it appears to be that she is completely healed as of today. I see no signs of active infection at this time which is great news. No fevers, chills, nausea, vomiting, or diarrhea. READMISSION 01/16/2021 Patient that we have had in this clinic with a wound on the left posterior calf in the summer 2021 extending into October. She was felt to have chronic venous insufficiency. Per the patient's description she was discharged with what sounds like external compression stockings although she could not afford the "$75 per leg". She therefore did not get anything. Apparently her wound that she has currently is been in existence since January. She has been followed at vein and vascular with Unna boots and I think calcium alginate. She had ABIs done on 06/25/2020 that showed an noncompressible ABI on the right leg and the left leg but triphasic waveforms with good great toe pressures and a TBI of 0.90 on the  right and 0.91 on the left Surprisingly the patient also had venous reflux studies that were really unremarkable. This included no evidence of DVTs or SVTs but there was no evidence of deep venous insufficiency or superficial venous insufficiency in the greater saphenous or short saphenous veins bilaterally. I would wonder about more central venous issues or perhaps this is all lymphedema 01/25/2021 upon evaluation today patient appears to be doing decently well and guard to her wound. The good news is the Iodoflex that is job she saw Dr. Dellia Nims last week for readmission and to be honest I think that this has done extremely well over that week. I do believe that she can tolerate the sharp debridement today which will be good. I think that cleaning off the wound will likely allow [place] to be able to use a different type of dressing I do not think the Iodoflex will even be necessary based on how clean the wound looks. Fortunately there is no sign of active infection at this time which is great news. No fevers, chills, nausea, vomiting, or diarrhea. 02/12/2021 upon evaluation today patient appears to be doing well with regard to her leg ulcer. We did switch to alginate when she came for nurse visit I think is doing much better for her. Fortunately there does not appear to be any signs of active infection at this time which is great news. No fevers, chills, nausea, vomiting, or diarrhea. CENA, BRUHN ([id number]) 02/18/2021 upon evaluation today patient's wound is actually showing signs of good improvement. I am very pleased with where things stand  currently. I do not see any signs of active infection which is great news and overall I feel like she is making good progress. The patient likewise is happy that things are doing so well and that this is measuring somewhat smaller. 02/25/2021 upon evaluation today patient actually appears to be doing quite well in regard to her wounds. She has been tolerating the  dressing changes without complication. Fortunately there does not appear to be any signs of active infection which is great news and overall I am extremely pleased with where things stand. No fevers, chills, nausea, vomiting, or diarrhea. She does have a lot of edema I really do think she needs compression socks to be worn in order to prevent things from causing additional issues for her currently. Especially on the right leg she should be wearing compression socks all the time to be honest. 03/04/2021 upon evaluation today patient's leg ulcer actually appears to be doing pretty well which is great news. There does not appear to be any significant change but overall the appearance is better even though the size is not necessarily reflecting a great improvement. Fortunately there does not appear to be any signs of active infection. No fevers, chills, nausea, vomiting, or diarrhea. 03/12/2021 upon evaluation today patient appears to be doing well with regard to her wounds. She has been tolerating the dressing changes without complication. The good news is that the wound on her left lateral leg is doing much better and is showing signs of good improvement. Overall her swelling is controlled well as well. She does need some compression socks she plans to go Jefferey socks next week. 03/18/2021 on evaluation today patient's wound actually is showing signs of good improvement. She does have a little bit of slough this can require some sharp debridement today. 03/25/2021 upon evaluation today patient appears to be doing well with regard to her wound on the left lateral leg. She has been tolerating the dressing changes without complication. Fortunately there does not appear to be any signs of active infection at this time. No fever chills no 04/01/2021 upon evaluation today patient's wound is showing signs of improvement and overall very pleased with where things stand at this point. There is no evidence of active  infection which is great news as well and in general I think that she is making great progress. 04/09/2021 upon evaluation today patient appears to be doing well with regard to her ankle ulcer. She is making good progress currently which is great news. There does not appear to be any signs of infection also excellent news. In general I am extremely pleased with where things stand today. No fevers, chills, nausea, vomiting, or diarrhea. 04/23/2021 upon inspection today patient appears to be doing quite well with regard to her leg ulcer. She has been tolerating the dressing changes without complication. She is can require some sharp debridement today but overall seems to be doing excellent. 05/06/2021 upon evaluation today patient unfortunately appears to be doing poorly overall in regard to her swelling. She is actually significantly more swollen than last week despite the fact that her wound is doing better this is not a good trend. I think that she probably needs to see her cardiologist ASAP and I discussed that with her today. This includes the fact that honestly I think she probably is getting need an increase in her diuretic or something to try to clear away some of the excess fluid that she is experiencing at the moment. She is  not been doing her pumps even twice a day but again right now more concerned that if she were to do the pump she would fluid overload her lungs causing other issues as far as her breathing is concerned. Obviously I think she needs to contact them and move up the appointment for the 12th of something sooner 2 weeks is a bit too long to wait with how swollen she has. 05/14/2021 upon evaluation today patient's wound actually showing signs of being a little bit smaller compared to previous. She has been making good progress however. Fortunately there does not appear to be any evidence of active infection which is great. Overall I am extremely pleased in that regard. Nonetheless I am  still concerned that she is having quite a bit of issue as far as the swelling is concerned she has been placed on fluid restriction as well as an increase in the furosemide by her doctor. We will see how things progress. 05/21/2021 upon evaluation today patient appears to be doing well with regard to her wound in general. She has been tolerating the dressing changes without complication. With that being said she has been using silver cell for a while and while this was showing signs of improvement it is now gotten to the point where this has slowed down quite significantly. I do believe that switching to a different dressing may be beneficial for her today. 05/28/2021 upon evaluation today patient actually appears to be doing quite well in regard to her wound. In fact there is really not any need for sharp debridement today based on what I see. The Hydrofera Blue is doing great and overall I think that she is managing quite nicely with the compression wrapping. Her edema is still down quite a bit with the wrapping in place. 06/11/2021 upon evaluation today patient appears to be doing well with regard to her leg ulcer. She has been tolerating the dressing changes without complication. Fortunately there does not appear to be any signs of active infection at this time which is great news. No fevers, chills, nausea, vomiting, or diarrhea. She is going require some sharp debridement today. 10/11; left leg ulcer much better looking and with improved surface area. She is using Hydrofera Blue under 3 layer compression. She does not have any stocking on the right leg which in itself has very significant nonpitting edema 06/25/2021 upon evaluation today patient appears to be doing well with regard to the wound on her left leg. She has been tolerating the dressing changes without complication and overall I am extremely pleased with where things stand today. I do not see any evidence of infection which is great  news. 07/02/2021 upon evaluation today patient's wound actually showing signs of excellent improvement and actually very pleased with where we stand today and the overall appearance. Fortunately there does not appear to be any signs of active infection. No fevers, chills, nausea, vomiting, or diarrhea. 07/09/2021 upon evaluation today patient appears to be doing better in regard to her wound this is measuring smaller and she is headed in the appropriate direction based on what I am seeing currently. I do not see any evidence of active infection systemically which is great news. Electronic Signature(s) Signed: 07/09/2021 3:31:26 PM By: Worthy Keeler PA-C Entered By: Worthy Keeler on 07/09/2021 15:31:25 ROBBIE, NANGLE ([id number]) CHANYA, CHRISLEY ([id number]) -------------------------------------------------------------------------------- Physical Exam Details Patient Name: [name] Date of Service: 07/09/2021 2:15 PM Medical Record Number: [id number] Patient Account Number: [id number] Date of  Birth/Sex: 07-Jul-1946 (75 y.o. F) Treating RN: Carlene Coria Primary Care Provider: [name] Other Clinician: Referring Provider: [name] Treating Provider/Extender: Skipper Cliche in Treatment: 5 Constitutional Well-nourished and well-hydrated in no acute distress. Respiratory normal breathing without difficulty. Psychiatric this patient is able to make decisions and demonstrates good insight into disease process. Alert and Oriented x 3. pleasant and cooperative. Notes Upon inspection patient's wound bed actually showed signs of good granulation and epithelization at this point. Fortunately there does not appear to be any evidence of active infection systemically which is great news and overall I am extremely pleased with where we stand today. Electronic Signature(s) Signed: 07/09/2021 3:31:51 PM By: Worthy Keeler PA-C Entered By: Worthy Keeler on 07/09/2021  15:31:51 [name] ([id number]) -------------------------------------------------------------------------------- Physician Orders Details Patient Name: [name] Date of Service: 07/09/2021 2:15 PM Medical Record Number: [id number] Patient Account Number: [id number] Date of Birth/Sex: February 14, 1946 (75 y.o. F) Treating RN: Carlene Coria Primary Care Provider: [name] Other Clinician: Referring Provider: [name] Treating Provider/Extender: Skipper Cliche in Treatment: 24 Verbal / Phone Orders: No Diagnosis Coding ICD-10 Coding Code Description I89.0 Lymphedema, not elsewhere classified I87.332 Chronic venous hypertension (idiopathic) with ulcer and inflammation of left lower extremity L97.828 Non-pressure chronic ulcer of other part of left lower leg with other specified severity Follow-up Appointments o Return Appointment in 1 week. Bathing/ Shower/ Hygiene o May shower with wound dressing protected with water repellent cover or cast protector. Edema Control - Lymphedema / Segmental Compressive Device / Other Left Lower Extremity o Optional: One layer of unna paste to top of compression wrap (to act as an anchor). o Tubigrip single layer applied. - RIGHT LEG tubi grip applied "E" until you get your compression sock use this and it's hand washable o Patient to wear own compression stockings. Remove compression stockings every night before going to bed and put on every morning when getting up. - on RIGHT LEG call [facility] for compression sock 15-22 mm/Hg, measurements and phone number on blue sheet [employer] on [employer] in [place] also has the compression socks o Elevate, Exercise Daily and Avoid Standing for Long Periods of Time. o Elevate legs to the level of the heart and pump ankles as often as possible o Elevate leg(s) parallel to the floor when sitting. o Compression Pump: Use compression pump on left lower extremity  for 60 minutes, twice daily. - You may pump over wraps o Compression Pump: Use compression pump on right lower extremity for 60 minutes, twice daily. - You may pump over wraps Additional Orders / Instructions Wound #2 Left,Lateral Lower Leg o Follow Nutritious Diet and Increase Protein Intake - Increase protein intake. Wound Treatment Wound #2 - Lower Leg Wound Laterality: Left, Lateral Cleanser: Soap and Water 1 x Per Week/30 Days Discharge Instructions: Gently cleanse wound with antibacterial soap, rinse and pat dry prior to dressing wounds Primary Dressing: Hydrofera Blue Ready Transfer Foam, 2.5x2.5 (in/in) 1 x Per Week/30 Days Discharge Instructions: Apply Hydrofera Blue Ready to wound bed as directed Secondary Dressing: ABD Pad 5x9 (in/in) 1 x Per Week/30 Days Discharge Instructions: Cover with ABD pad Compression Wrap: Profore Lite LF 3 Multilayer Compression Bandaging System 1 x Per Week/30 Days Discharge Instructions: Apply 3 multi-layer wrap as prescribed. Electronic Signature(s) Signed: 07/10/2021 6:58:05 PM By: Worthy Keeler PA-C Signed: 07/11/2021 5:07:02 PM By: Carlene Coria RN Entered By: Carlene Coria on 07/09/2021 15:00:55 [name] ([id number]) -------------------------------------------------------------------------------- Problem List Details Patient Name: Nila, Winker  Chariti Date of Service: 07/09/2021 2:15 PM Medical Record Number: [id number] Patient Account Number: [id number] Date of Birth/Sex: [birth year] (75 y.o. F) Treating RN: Carlene Coria Primary Care Provider: [name] Other Clinician: Referring Provider: [name] Treating Provider/Extender: Skipper Cliche in Treatment: 24 Active Problems ICD-10 Encounter Code Description Active Date MDM Diagnosis I89.0 Lymphedema, not elsewhere classified 01/16/2021 No Yes I87.332 Chronic venous hypertension (idiopathic) with ulcer and inflammation of 01/16/2021 No Yes left lower extremity L97.828  Non-pressure chronic ulcer of other part of left lower leg with other 01/16/2021 No Yes specified severity Inactive Problems Resolved Problems Electronic Signature(s) Signed: 07/09/2021 2:53:00 PM By: Worthy Keeler PA-C Entered By: Worthy Keeler on 07/09/2021 14:52:59 [name] ([id number]) -------------------------------------------------------------------------------- Progress Note Details Patient Name: [name] Date of Service: 07/09/2021 2:15 PM Medical Record Number: [id number] Patient Account Number: [id number] Date of Birth/Sex: [birth year] (75 y.o. F) Treating RN: Carlene Coria Primary Care Provider: [name] Other Clinician: Referring Provider: [name] Treating Provider/Extender: Skipper Cliche in Treatment: 24 Subjective Chief Complaint Information obtained from Patient Left LE Ulcer History of Present Illness (HPI) 03/15/2020 upon evaluation today patient presents today for initial evaluation here in our clinic concerning a wound that is on the left posterior lower extremity. Unfortunately this has been giving the patient some discomfort at this point which she notes has been affected her pretty much daily. The patient does have a history of venous insufficiency, hypertension, congestive heart failure, and a history of breast cancer. Fortunately there does not appear to be evidence of active infection at this time which is great news. No fevers, chills, nausea, vomiting, or diarrhea. Wound is not extremely large but does have some slough covering the surface of the wound. This is going require sharp debridement today. 03/15/2020 upon evaluation today patient appears to be doing fairly well in regard to her wound. She does have some slough noted on the surface of the wound currently but I do believe the Iodoflex has been beneficial over the past week. There is no signs of active infection at this time which is great news and overall very pleased with the  progress. No fevers, chills, nausea, vomiting, or diarrhea. 04/02/2020 upon evaluation today patient appears to be doing a little better in regard to her wound size wise this is not tremendously smaller she does have some slough buildup on the surface of the wound. With that being said this is going require some sharp debridement today to clear away some of the necrotic debris. Fortunately there is no evidence of active infection at this time. No fevers, chills, nausea, vomiting, or diarrhea. 04/10/2020 on evaluation today patient appears to be doing well with regard to her ulcer which is measuring somewhat smaller today. She actually has more granulation tissue noted which is also good news is no need for sharp debridement today. Fortunately there is no evidence of infection either which is also excellent. No fevers, chills, nausea, vomiting, or diarrhea. 04/26/2020 on evaluation today patient's wound actually is showing signs of good improvement which is great news. There does not appear to be any evidence of active infection. I do believe she is tolerating the collagen at this point. 05/03/2020 upon evaluation today patient's wound actually showed signs of good granulation at this time there does not appear to be any evidence of active infection which is great news and overall very pleased with where things stand. With that being said I do believe that the patient is can require some  sharp debridement today but fortunately nothing too significant. 05/11/20 on evaluation today patient appears to be doing well in regard to her leg ulcer. She's been tolerating the dressing changes without complication. Fortunately there is no signs of active infection at this time. Overall I'm very pleased with where things stand. 05/18/2020 upon evaluation today patient's wound is showing signs of improvement is very dry and I think the collagen for the most part just dried to the wound bed. I am going to clean this away  with sharp debridement in order to get this under control in my opinion. 05/25/2020 upon evaluation today patient actually appears to be doing well in regard to her leg ulcer. In fact upon inspection it appears she could potentially be completely healed but that is not guaranteed based on what we are seeing. There does not appear to be any signs of active infection which is great news. 05/31/2020 on evaluation today patient appears to be doing well with regard to her wounds. She in fact appears to be almost completely healed on the left leg there is just a very small opening remaining point 1 06/08/2020 upon evaluation today patient appears to be doing excellent in regard to her leg ulcer on the left in fact it appears to be that she is completely healed as of today. I see no signs of active infection at this time which is great news. No fevers, chills, nausea, vomiting, or diarrhea. READMISSION 01/16/2021 Patient that we have had in this clinic with a wound on the left posterior calf in the summer 2021 extending into October. She was felt to have chronic venous insufficiency. Per the patient's description she was discharged with what sounds like external compression stockings although she could not afford the "$75 per leg". She therefore did not get anything. Apparently her wound that she has currently is been in existence since January. She has been followed at vein and vascular with Unna boots and I think calcium alginate. She had ABIs done on 06/25/2020 that showed an noncompressible ABI on the right leg and the left leg but triphasic waveforms with good great toe pressures and a TBI of 0.90 on the right and 0.91 on the left Surprisingly the patient also had venous reflux studies that were really unremarkable. This included no evidence of DVTs or SVTs but there was no evidence of deep venous insufficiency or superficial venous insufficiency in the greater saphenous or short saphenous veins  bilaterally. I would wonder about more central venous issues or perhaps this is all lymphedema 01/25/2021 upon evaluation today patient appears to be doing decently well and guard to her wound. The good news is the Iodoflex that is job she saw Dr. Dellia Nims last week for readmission and to be honest I think that this has done extremely well over that week. I do believe that she can tolerate the sharp debridement today which will be good. I think that cleaning off the wound will likely allow [place] to be able to use a different type of dressing I do not think the Iodoflex will even be necessary based on how clean the wound looks. Fortunately there is no sign of active infection at this time which is great news. No fevers, chills, nausea, vomiting, or diarrhea. HANLEY, [place] ([id number]) 02/12/2021 upon evaluation today patient appears to be doing well with regard to her leg ulcer. We did switch to alginate when she came for nurse visit I think is doing much better for her. Fortunately there does  not appear to be any signs of active infection at this time which is great news. No fevers, chills, nausea, vomiting, or diarrhea. 02/18/2021 upon evaluation today patient's wound is actually showing signs of good improvement. I am very pleased with where things stand currently. I do not see any signs of active infection which is great news and overall I feel like she is making good progress. The patient likewise is happy that things are doing so well and that this is measuring somewhat smaller. 02/25/2021 upon evaluation today patient actually appears to be doing quite well in regard to her wounds. She has been tolerating the dressing changes without complication. Fortunately there does not appear to be any signs of active infection which is great news and overall I am extremely pleased with where things stand. No fevers, chills, nausea, vomiting, or diarrhea. She does have a lot of edema I really do think she needs  compression socks to be worn in order to prevent things from causing additional issues for her currently. Especially on the right leg she should be wearing compression socks all the time to be honest. 03/04/2021 upon evaluation today patient's leg ulcer actually appears to be doing pretty well which is great news. There does not appear to be any significant change but overall the appearance is better even though the size is not necessarily reflecting a great improvement. Fortunately there does not appear to be any signs of active infection. No fevers, chills, nausea, vomiting, or diarrhea. 03/12/2021 upon evaluation today patient appears to be doing well with regard to her wounds. She has been tolerating the dressing changes without complication. The good news is that the wound on her left lateral leg is doing much better and is showing signs of good improvement. Overall her swelling is controlled well as well. She does need some compression socks she plans to go Jefferey socks next week. 03/18/2021 on evaluation today patient's wound actually is showing signs of good improvement. She does have a little bit of slough this can require some sharp debridement today. 03/25/2021 upon evaluation today patient appears to be doing well with regard to her wound on the left lateral leg. She has been tolerating the dressing changes without complication. Fortunately there does not appear to be any signs of active infection at this time. No fever chills no 04/01/2021 upon evaluation today patient's wound is showing signs of improvement and overall very pleased with where things stand at this point. There is no evidence of active infection which is great news as well and in general I think that she is making great progress. 04/09/2021 upon evaluation today patient appears to be doing well with regard to her ankle ulcer. She is making good progress currently which is great news. There does not appear to be any signs of  infection also excellent news. In general I am extremely pleased with where things stand today. No fevers, chills, nausea, vomiting, or diarrhea. 04/23/2021 upon inspection today patient appears to be doing quite well with regard to her leg ulcer. She has been tolerating the dressing changes without complication. She is can require some sharp debridement today but overall seems to be doing excellent. 05/06/2021 upon evaluation today patient unfortunately appears to be doing poorly overall in regard to her swelling. She is actually significantly more swollen than last week despite the fact that her wound is doing better this is not a good trend. I think that she probably needs to see her cardiologist ASAP and I  discussed that with her today. This includes the fact that honestly I think she probably is getting need an increase in her diuretic or something to try to clear away some of the excess fluid that she is experiencing at the moment. She is not been doing her pumps even twice a day but again right now more concerned that if she were to do the pump she would fluid overload her lungs causing other issues as far as her breathing is concerned. Obviously I think she needs to contact them and move up the appointment for the 12th of something sooner 2 weeks is a bit too long to wait with how swollen she has. 05/14/2021 upon evaluation today patient's wound actually showing signs of being a little bit smaller compared to previous. She has been making good progress however. Fortunately there does not appear to be any evidence of active infection which is great. Overall I am extremely pleased in that regard. Nonetheless I am still concerned that she is having quite a bit of issue as far as the swelling is concerned she has been placed on fluid restriction as well as an increase in the furosemide by her doctor. We will see how things progress. 05/21/2021 upon evaluation today patient appears to be doing well with  regard to her wound in general. She has been tolerating the dressing changes without complication. With that being said she has been using silver cell for a while and while this was showing signs of improvement it is now gotten to the point where this has slowed down quite significantly. I do believe that switching to a different dressing may be beneficial for her today. 05/28/2021 upon evaluation today patient actually appears to be doing quite well in regard to her wound. In fact there is really not any need for sharp debridement today based on what I see. The Hydrofera Blue is doing great and overall I think that she is managing quite nicely with the compression wrapping. Her edema is still down quite a bit with the wrapping in place. 06/11/2021 upon evaluation today patient appears to be doing well with regard to her leg ulcer. She has been tolerating the dressing changes without complication. Fortunately there does not appear to be any signs of active infection at this time which is great news. No fevers, chills, nausea, vomiting, or diarrhea. She is going require some sharp debridement today. 10/11; left leg ulcer much better looking and with improved surface area. She is using Hydrofera Blue under 3 layer compression. She does not have any stocking on the right leg which in itself has very significant nonpitting edema 06/25/2021 upon evaluation today patient appears to be doing well with regard to the wound on her left leg. She has been tolerating the dressing changes without complication and overall I am extremely pleased with where things stand today. I do not see any evidence of infection which is great news. 07/02/2021 upon evaluation today patient's wound actually showing signs of excellent improvement and actually very pleased with where we stand today and the overall appearance. Fortunately there does not appear to be any signs of active infection. No fevers, chills, nausea, vomiting, or  diarrhea. 07/09/2021 upon evaluation today patient appears to be doing better in regard to her wound this is measuring smaller and she is headed in the appropriate direction based on what I am seeing currently. I do not see any evidence of active infection systemically which is great news. [place], [place] ([id number]) Objective Constitutional  Well-nourished and well-hydrated in no acute distress. Vitals Time Taken: 2:51 PM, Height: 69 in, Weight: 244 lbs, BMI: 36, Temperature: 99.5 F, Pulse: 69 bpm, Respiratory Rate: 18 breaths/min, Blood Pressure: 156/79 mmHg. Respiratory normal breathing without difficulty. Psychiatric this patient is able to make decisions and demonstrates good insight into disease process. Alert and Oriented x 3. pleasant and cooperative. General Notes: Upon inspection patient's wound bed actually showed signs of good granulation and epithelization at this point. Fortunately there does not appear to be any evidence of active infection systemically which is great news and overall I am extremely pleased with where we stand today. Integumentary (Hair, Skin) Wound #2 status is Open. Original cause of wound was Gradually Appeared. The date acquired was: 09/08/2020. The wound has been in treatment 24 weeks. The wound is located on the Left,Lateral Lower Leg. The wound measures 1.5cm length x 0.5cm width x 0.1cm depth; 0.589cm^2 area and 0.059cm^3 volume. There is Fat Layer (Subcutaneous Tissue) exposed. There is no tunneling or undermining noted. There is a medium amount of serosanguineous drainage noted. There is large (67-100%) red granulation within the wound bed. There is no necrotic tissue within the wound bed. 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 There was a Three Layer Compression Therapy Procedure by Carlene Coria, RN. Post procedure Diagnosis Wound #2: Same as Pre-Procedure Plan Follow-up Appointments: Return Appointment in 1 week. Bathing/ Shower/ Hygiene: May shower with wound dressing protected with water repellent cover or cast protector. Edema Control - Lymphedema / Segmental Compressive Device / Other: Optional: One layer of unna paste to top of compression wrap (to act as an anchor). Tubigrip single layer applied. - RIGHT LEG tubi grip applied "E" until you get your compression sock use this and it's hand washable Patient to wear own compression stockings. Remove compression stockings every night before going to bed and put on every morning when getting up. - on RIGHT LEG call [facility] for compression sock 15-22 mm/Hg, measurements and phone number on blue sheet [employer] on [address] [employer] in [place] also has the compression socks [place], [place] ([id number]) Elevate, Exercise Daily and Avoid Standing for Long Periods of Time. Elevate legs to the level of the heart and pump ankles as often as possible Elevate leg(s) parallel to the floor when sitting. Compression Pump: Use compression pump on left lower extremity for 60 minutes, twice daily. - You may pump over wraps Compression Pump: Use compression pump on right lower extremity for 60 minutes, twice daily. - You may pump over wraps Additional Orders / Instructions: Wound #2 Left,Lateral Lower Leg: Follow Nutritious Diet and Increase Protein Intake - Increase protein intake. WOUND #2: - Lower Leg Wound Laterality: Left, Lateral Cleanser: Soap and Water 1 x Per Week/30 Days Discharge Instructions: Gently cleanse wound with antibacterial soap, rinse and pat dry prior to dressing wounds Primary Dressing: Hydrofera Blue Ready  Transfer Foam, 2.5x2.5 (in/in) 1 x Per Week/30 Days Discharge Instructions: Apply Hydrofera Blue Ready to wound bed as directed Secondary Dressing: ABD Pad 5x9 (in/in) 1 x Per Week/30 Days Discharge Instructions: Cover with ABD pad Compression Wrap: Profore Lite LF 3 Multilayer Compression Bandaging System 1 x Per Week/30 Days Discharge Instructions: Apply 3 multi-layer wrap  as prescribed. 1. Would recommend currently that we going continue with the wound care measures as before and the patient is in agreement with the plan. This includes the use of the [facility] dressing which I think is doing a good job for her. 2. I am also can recommend that we have the patient continue with the compression wrapping. I do think this is doing a great job on this left leg. We will get a keep things moving along in that regard. 3. I am also can recommend that we continue to utilize something for compression on the right leg I think Tubigrip would be ideal we will see how things go. We will see patient back for reevaluation in 1 week here in the clinic. If anything worsens or changes patient will contact our office for additional recommendations. Electronic Signature(s) Signed: 07/09/2021 3:32:28 PM By: Worthy Keeler PA-C Entered By: Worthy Keeler on 07/09/2021 15:32:28 [name] ([id number]) -------------------------------------------------------------------------------- SuperBill Details Patient Name: [name] Date of Service: 07/09/2021 Medical Record Number: [id number] Patient Account Number: [id number] Date of Birth/Sex: [birth year] (75 y.o. F) Treating RN: Carlene Coria Primary Care Provider: [name] Other Clinician: Referring Provider: [name] Treating Provider/Extender: Skipper Cliche in Treatment: 24 Diagnosis Coding ICD-10 Codes Code Description I89.0 Lymphedema, not elsewhere classified I87.332 Chronic venous hypertension (idiopathic) with ulcer and  inflammation of left lower extremity L97.828 Non-pressure chronic ulcer of other part of left lower leg with other specified severity Facility Procedures CPT4 Code: [id number] Description: (Facility Use Only) [phone] - [place] LWR LT LEG Modifier: Quantity: 1 Physician Procedures CPT4 Code Description: [id number] - WC PHYS LEVEL 4 - EST PT Modifier: Quantity: 1 CPT4 Code Description: ICD-10 Diagnosis Description I89.0 Lymphedema, not elsewhere classified I87.332 Chronic venous hypertension (idiopathic) with ulcer and inflammation of le L97.828 Non-pressure chronic ulcer of other part of left lower leg with  other spec Modifier: ft lower extremity ified severity Quantity: Electronic Signature(s) Signed: 07/09/2021 3:32:54 PM By: Worthy Keeler PA-C Entered By: Worthy Keeler on 07/09/2021 15:32:53

## 2021-07-12 NOTE — Progress Notes (Signed)
COURTLYNN, HOLLOMAN ([id number]) Visit Report for 07/09/2021 Arrival Information Details Patient Name: [name], [name] Date of Service: 07/09/2021 2:15 PM Medical Record Number: [id number] Patient Account Number: [id number] Date of Birth/Sex: [birth year] (75 y.o. F) Treating RN: Carlene Coria Primary Care Isobella Ascher: [name] Other Clinician: Referring Jenisis Harmsen: [name] Treating Jaslyne Beeck/Extender: Skipper Cliche in Treatment: 24 Visit Information History Since Last Visit All ordered tests and consults were completed: No Patient Arrived: Wheel Chair Added or deleted any medications: No Arrival Time: 14:38 Any new allergies or adverse reactions: No Accompanied By: self Had a fall or experienced change in No Transfer Assistance: None activities of daily living that may affect Patient Identification Verified: Yes risk of falls: Secondary Verification Process Completed: Yes Signs or symptoms of abuse/neglect since last visito No Patient Requires Transmission-Based Precautions: No Hospitalized since last visit: No Patient Has Alerts: Yes Implantable device outside of the clinic excluding No Patient Alerts: NOT DIABETIC cellular tissue based products placed in the center since last visit: Has Dressing in Place as Prescribed: Yes Pain Present Now: No Electronic Signature(s) Signed: 07/11/2021 5:07:02 PM By: Carlene Coria RN Entered By: Carlene Coria on 07/09/2021 14:51:51 [name] ([id number]) -------------------------------------------------------------------------------- Clinic Level of Care Assessment Details Patient Name: [name] Date of Service: 07/09/2021 2:15 PM Medical Record Number: [id number] Patient Account Number: [id number] Date of Birth/Sex: 17-Nov-1945 (75 y.o. F) Treating RN: Carlene Coria Primary Care Orazio Weller: [name] Other Clinician: Referring Mohsin Crum: [name] Treating Pearla Mckinny/Extender: Skipper Cliche in Treatment: 24 Clinic  Level of Care Assessment Items TOOL 1 Quantity Score []  - Use when EandM and Procedure is performed on INITIAL visit 0 ASSESSMENTS - Nursing Assessment / Reassessment []  - General Physical Exam (combine w/ comprehensive assessment (listed just below) when performed on new 0 pt. evals) []  - 0 Comprehensive Assessment (HX, ROS, Risk Assessments, Wounds Hx, etc.) ASSESSMENTS - Wound and Skin Assessment / Reassessment []  - Dermatologic / Skin Assessment (not related to wound area) 0 ASSESSMENTS - Ostomy and/or Continence Assessment and Care []  - Incontinence Assessment and Management 0 []  - 0 Ostomy Care Assessment and Management (repouching, etc.) PROCESS - Coordination of Care []  - Simple Patient / Family Education for ongoing care 0 []  - 0 Complex (extensive) Patient / Family Education for ongoing care []  - 0 Staff obtains Programmer, systems, Records, Test Results / Process Orders []  - 0 Staff telephones HHA, Nursing Homes / Clarify orders / etc []  - 0 Routine Transfer to another Facility (non-emergent condition) []  - 0 Routine Hospital Admission (non-emergent condition) []  - 0 New Admissions / Biomedical engineer / Ordering NPWT, Apligraf, etc. []  - 0 Emergency Hospital Admission (emergent condition) PROCESS - Special Needs []  - Pediatric / Minor Patient Management 0 []  - 0 Isolation Patient Management []  - 0 Hearing / Language / Visual special needs []  - 0 Assessment of Community assistance (transportation, D/C planning, etc.) []  - 0 Additional assistance / Altered mentation []  - 0 Support Surface(s) Assessment (bed, cushion, seat, etc.) INTERVENTIONS - Miscellaneous []  - External ear exam 0 []  - 0 Patient Transfer (multiple staff / Civil Service fast streamer / Similar devices) []  - 0 Simple Staple / Suture removal (25 or less) []  - 0 Complex Staple / Suture removal (26 or more) []  - 0 Hypo/Hyperglycemic Management (do not check if billed separately) []  - 0 Ankle / Brachial Index  (ABI) - do not check if billed separately Has the patient been seen at the hospital within the last three years: Yes Total Score: 0  Level Of Care: ____ [name] ([id number]) Electronic Signature(s) Signed: 07/11/2021 5:07:02 PM By: Carlene Coria RN Entered By: Carlene Coria on 07/09/2021 15:01:17 [name] ([id number]) -------------------------------------------------------------------------------- Compression Therapy Details Patient Name: [name] Date of Service: 07/09/2021 2:15 PM Medical Record Number: [id number] Patient Account Number: [id number] Date of Birth/Sex: 1946-06-06 (75 y.o. F) Treating RN: Carlene Coria Primary Care Vanice Rappa: [name] Other Clinician: Referring Nikolaus Pienta: [name] Treating Danijela Vessey/Extender: Skipper Cliche in Treatment: 24 Compression Therapy Performed for Wound Assessment: Wound #2 Left,Lateral Lower Leg Performed By: Clinician Carlene Coria, RN Compression Type: Three Layer Post Procedure Diagnosis Same as Pre-procedure Electronic Signature(s) Signed: 07/11/2021 5:07:02 PM By: Carlene Coria RN Entered By: Carlene Coria on 07/09/2021 14:59:52 [name] ([id number]) -------------------------------------------------------------------------------- Encounter Discharge Information Details Patient Name: [name] Date of Service: 07/09/2021 2:15 PM Medical Record Number: [id number] Patient Account Number: [id number] Date of Birth/Sex: 1946/08/19 (75 y.o. F) Treating RN: Carlene Coria Primary Care Nya Monds: [name] Other Clinician: Referring Maylee Bare: [name] Treating Nkechi Linehan/Extender: Skipper Cliche in Treatment: 24 Encounter Discharge Information Items Discharge Condition: Stable Ambulatory Status: Ambulatory Discharge Destination: Home Transportation: Private Auto Accompanied By: self Schedule Follow-up Appointment: Yes Clinical Summary of Care: Patient Declined Electronic  Signature(s) Signed: 07/11/2021 5:07:02 PM By: Carlene Coria RN Entered By: Carlene Coria on 07/09/2021 15:03:09 [name] ([id number]) -------------------------------------------------------------------------------- Lower Extremity Assessment Details Patient Name: [name] Date of Service: 07/09/2021 2:15 PM Medical Record Number: [id number] Patient Account Number: [id number] Date of Birth/Sex: 11-02-1945 (75 y.o. F) Treating RN: Carlene Coria Primary Care Micholas Drumwright: [name] Other Clinician: Referring Tou Hayner: [name] Treating Yovanni Frenette/Extender: Skipper Cliche in Treatment: 24 Edema Assessment Assessed: [Left: No] [Right: No] Edema: [Left: Ye] [Right: s] Calf Left: Right: Point of Measurement: 27 cm From Medial Instep 37 cm Ankle Left: Right: Point of Measurement: 10 cm From Medial Instep 24 cm Vascular Assessment Pulses: Dorsalis Pedis Palpable: [Left:Yes] Electronic Signature(s) Signed: 07/11/2021 5:07:02 PM By: Carlene Coria RN Entered By: Carlene Coria on 07/09/2021 14:54:47 [name] ([id number]) -------------------------------------------------------------------------------- Multi Wound Chart Details Patient Name: [name] Date of Service: 07/09/2021 2:15 PM Medical Record Number: [id number] Patient Account Number: [id number] Date of Birth/Sex: 08/13/1946 (75 y.o. F) Treating RN: Carlene Coria Primary Care Volanda Mangine: [name] Other Clinician: Referring Hieu Herms: [name] Treating Dorri Ozturk/Extender: Skipper Cliche in Treatment: 24 Vital Signs Height(in): 69 Pulse(bpm): 24 Weight(lbs): 244 Blood Pressure(mmHg): 156/79 Body Mass Index(BMI): 36 Temperature(F): 99.5 Respiratory Rate(breaths/min): 18 Photos: [N/A:N/A] Wound Location: Left, Lateral Lower Leg N/A N/A Wounding Event: Gradually Appeared N/A N/A Primary Etiology: Venous Leg Ulcer N/A N/A Comorbid History: Cataracts, Lymphedema, Sleep N/A N/A Apnea,  Congestive Heart Failure, Hypertension, Osteoarthritis, Neuropathy, Received Chemotherapy, Received Radiation Date Acquired: 09/08/2020 N/A N/A Weeks of Treatment: 24 N/A N/A Wound Status: Open N/A N/A Measurements L x W x D (cm) 1.5x0.5x0.1 N/A N/A Area (cm) : 0.589 N/A N/A Volume (cm) : 0.059 N/A N/A % Reduction in Area: 87.50% N/A N/A % Reduction in Volume: 87.50% N/A N/A Classification: Full Thickness Without Exposed N/A N/A Support Structures Exudate Amount: Medium N/A N/A Exudate Type: Serosanguineous N/A N/A Exudate Color: red, brown N/A N/A Granulation Amount: Large (67-100%) N/A N/A Granulation Quality: Red N/A N/A Necrotic Amount: None Present (0%) N/A N/A Exposed Structures: Fat Layer (Subcutaneous Tissue): N/A N/A Yes Fascia: No Tendon: No Muscle: No Joint: No Bone: No Epithelialization: Small (1-33%) N/A N/A Treatment Notes Electronic Signature(s) Signed: 07/11/2021 5:07:02 PM By: Carlene Coria RN Entered By: Carlene Coria on 07/09/2021 14:59:26  [name], [name] ([id number]) -------------------------------------------------------------------------------- Multi-Disciplinary Care Plan Details Patient Name: [name], [name] Date of Service: 07/09/2021 2:15 PM Medical Record Number: [id number] Patient Account Number: [id number] Date of Birth/Sex: 1946/05/28 (75 y.o. F) Treating RN: Carlene Coria Primary Care Addysin Porco: [name] Other Clinician: Referring Shishir Krantz: [name] Treating Kenita Bines/Extender: Skipper Cliche in Treatment: 24 Active Inactive Wound/Skin Impairment Nursing Diagnoses: Impaired tissue integrity Goals: Patient/caregiver will verbalize understanding of skin care regimen Date Initiated: 01/16/2021 Date Inactivated: 02/18/2021 Target Resolution Date: 02/16/2021 Goal Status: Met Ulcer/skin breakdown will have a volume reduction of 30% by week 4 Date Initiated: 01/16/2021 Date Inactivated: 02/18/2021 Target Resolution Date:  02/16/2021 Goal Status: Met Ulcer/skin breakdown will have a volume reduction of 50% by week 8 Date Initiated: 01/16/2021 Date Inactivated: 04/01/2021 Target Resolution Date: 03/18/2021 Goal Status: Unmet Unmet Reason: comorbities Ulcer/skin breakdown will have a volume reduction of 80% by week 12 Date Initiated: 04/01/2021 Date Inactivated: 04/23/2021 Target Resolution Date: 04/18/2021 Goal Status: Unmet Unmet Reason: comorbities Ulcer/skin breakdown will heal within 14 weeks Date Initiated: 04/01/2021 Target Resolution Date: 05/19/2021 Goal Status: Active Interventions: Assess ulceration(s) every visit Treatment Activities: Topical wound management initiated : 01/16/2021 Notes: Electronic Signature(s) Signed: 07/11/2021 5:07:02 PM By: Carlene Coria RN Entered By: Carlene Coria on 07/09/2021 14:59:03 [name] ([id number]) -------------------------------------------------------------------------------- Pain Assessment Details Patient Name: [name] Date of Service: 07/09/2021 2:15 PM Medical Record Number: [id number] Patient Account Number: [id number] Date of Birth/Sex: [birth year] (75 y.o. F) Treating RN: Carlene Coria Primary Care Marketa Midkiff: [name] Other Clinician: Referring Jowana Thumma: [name] Treating Jasmia Angst/Extender: Skipper Cliche in Treatment: 24 Active Problems Location of Pain Severity and Description of Pain Patient Has Paino No Site Locations Pain Management and Medication Current Pain Management: Electronic Signature(s) Signed: 07/11/2021 5:07:02 PM By: Carlene Coria RN Entered By: Carlene Coria on 07/09/2021 14:52:36 [name] ([id number]) -------------------------------------------------------------------------------- Patient/Caregiver Education Details Patient Name: [name] Date of Service: 07/09/2021 2:15 PM Medical Record Number: [id number] Patient Account Number: [id number] Date of Birth/Gender: 1946-08-28 (75 y.o.  F) Treating RN: Carlene Coria Primary Care Physician: [name] Other Clinician: Referring Physician: [name] Treating Physician/Extender: Skipper Cliche in Treatment: 24 Education Assessment Education Provided To: Patient Education Topics Provided Wound/Skin Impairment: Methods: Explain/Verbal Responses: State content correctly Electronic Signature(s) Signed: 07/11/2021 5:07:02 PM By: Carlene Coria RN Entered By: Carlene Coria on 07/09/2021 15:01:58 [name] ([id number]) -------------------------------------------------------------------------------- Wound Assessment Details Patient Name: [name] Date of Service: 07/09/2021 2:15 PM Medical Record Number: [id number] Patient Account Number: [id number] Date of Birth/Sex: 1945/12/11 (75 y.o. F) Treating RN: Carlene Coria Primary Care Olevia Westervelt: [name] Other Clinician: Referring Kadra Kohan: [name] Treating Cecely Rengel/Extender: Skipper Cliche in Treatment: 24 Wound Status Wound Number: 2 Primary Venous Leg Ulcer Etiology: Wound Location: Left, Lateral Lower Leg Wound Open Wounding Event: Gradually Appeared Status: Date Acquired: 09/08/2020 Comorbid Cataracts, Lymphedema, Sleep Apnea, Congestive Heart Weeks Of Treatment: 24 History: Failure, Hypertension, Osteoarthritis, Neuropathy, Clustered Wound: No Received Chemotherapy, Received Radiation Photos Wound Measurements Length: (cm) 1.5 Width: (cm) 0.5 Depth: (cm) 0.1 Area: (cm) 0.589 Volume: (cm) 0.059 % Reduction in Area: 87.5% % Reduction in Volume: 87.5% Epithelialization: Small (1-33%) Tunneling: No Undermining: No Wound Description Classification: Full Thickness Without Exposed Support Structu Exudate Amount: Medium Exudate Type: Serosanguineous Exudate Color: red, brown res Wound Bed Granulation Amount: Large (67-100%) Exposed Structure Granulation Quality: Red Fascia Exposed: No Necrotic Amount: None Present (0%) Fat  Layer (Subcutaneous Tissue) Exposed: Yes Tendon Exposed: No Muscle Exposed: No Joint Exposed: No Bone Exposed: No Treatment  Notes Wound #2 (Lower Leg) Wound Laterality: Left, Lateral Cleanser Soap and Water Discharge Instruction: Gently cleanse wound with antibacterial soap, rinse and pat dry prior to dressing wounds Peri-Wound Care [name], [name] ([id number]) Topical Primary Dressing Hydrofera Blue Ready Transfer Foam, 2.5x2.5 (in/in) Discharge Instruction: Apply Hydrofera Blue Ready to wound bed as directed Secondary Dressing ABD Pad 5x9 (in/in) Discharge Instruction: Cover with ABD pad Secured With Compression Wrap Profore Lite LF 3 Multilayer Compression Bandaging System Discharge Instruction: Apply 3 multi-layer wrap as prescribed. Compression Stockings Add-Ons Electronic Signature(s) Signed: 07/11/2021 5:07:02 PM By: Carlene Coria RN Entered By: Carlene Coria on 07/09/2021 14:53:49 [name] ([id number]) -------------------------------------------------------------------------------- [place] Details Patient Name: [name] Date of Service: 07/09/2021 2:15 PM Medical Record Number: [id number] Patient Account Number: [id number] Date of Birth/Sex: [birth year] (75 y.o. F) Treating RN: Carlene Coria Primary Care Freda Jaquith: [name] Other Clinician: Referring Willmar Stockinger: [name] Treating Clanton Emanuelson/Extender: Skipper Cliche in Treatment: 24 Vital Signs Time Taken: 14:51 Temperature (F): 99.5 Height (in): 69 Pulse (bpm): 69 Weight (lbs): 244 Respiratory Rate (breaths/min): 18 Body Mass Index (BMI): 36 Blood Pressure (mmHg): 156/79 Reference Range: 80 - 120 mg / dl Electronic Signature(s) Signed: 07/11/2021 5:07:02 PM By: Carlene Coria RN Entered By: Carlene Coria on 07/09/2021 14:52:23

## 2021-07-16 ENCOUNTER — Other Ambulatory Visit: Payer: Self-pay

## 2021-07-16 ENCOUNTER — Encounter: Payer: Medicare Other | Admitting: Physician Assistant

## 2021-07-16 DIAGNOSIS — I890 Lymphedema, not elsewhere classified: Secondary | ICD-10-CM | POA: Diagnosis not present

## 2021-07-16 NOTE — Progress Notes (Addendum)
ANNISTON, NELLUMS ([id number]) Visit Report for 07/16/2021 Chief Complaint Document Details Patient Name: [name], [name] Date of Service: 07/16/2021 1:15 PM Medical Record Number: [id number] Patient Account Number: [id number] Date of Birth/Sex: September 25, 1945 (75 y.o. F) Treating RN: Carlene Coria Primary Care Provider: [name] Other Clinician: Referring Provider: [name] Treating Provider/Extender: Skipper Cliche in Treatment: 25 Information Obtained from: Patient Chief Complaint Left LE Ulcer Electronic Signature(s) Signed: 07/16/2021 1:22:18 PM By: Worthy Keeler PA-C Entered By: Worthy Keeler on 07/16/2021 13:22:17 [name] ([id number]) -------------------------------------------------------------------------------- HPI Details Patient Name: [name] Date of Service: 07/16/2021 1:15 PM Medical Record Number: [id number] Patient Account Number: [id number] Date of Birth/Sex: 22-Nov-1945 (75 y.o. F) Treating RN: Carlene Coria Primary Care Provider: [name] Other Clinician: Referring Provider: [name] Treating Provider/Extender: Skipper Cliche in Treatment: 25 History of Present Illness HPI Description: 03/15/2020 upon evaluation today patient presents today for initial evaluation here in our clinic concerning a wound that is on the left posterior lower extremity. Unfortunately this has been giving the patient some discomfort at this point which she notes has been affected her pretty much daily. The patient does have a history of venous insufficiency, hypertension, congestive heart failure, and a history of breast cancer. Fortunately there does not appear to be evidence of active infection at this time which is great news. No fevers, chills, nausea, vomiting, or diarrhea. Wound is not extremely large but does have some slough covering the surface of the wound. This is going require sharp debridement today. 03/15/2020 upon evaluation today patient  appears to be doing fairly well in regard to her wound. She does have some slough noted on the surface of the wound currently but I do believe the Iodoflex has been beneficial over the past week. There is no signs of active infection at this time which is great news and overall very pleased with the progress. No fevers, chills, nausea, vomiting, or diarrhea. 04/02/2020 upon evaluation today patient appears to be doing a little better in regard to her wound size wise this is not tremendously smaller she does have some slough buildup on the surface of the wound. With that being said this is going require some sharp debridement today to clear away some of the necrotic debris. Fortunately there is no evidence of active infection at this time. No fevers, chills, nausea, vomiting, or diarrhea. 04/10/2020 on evaluation today patient appears to be doing well with regard to her ulcer which is measuring somewhat smaller today. She actually has more granulation tissue noted which is also good news is no need for sharp debridement today. Fortunately there is no evidence of infection either which is also excellent. No fevers, chills, nausea, vomiting, or diarrhea. 04/26/2020 on evaluation today patient's wound actually is showing signs of good improvement which is great news. There does not appear to be any evidence of active infection. I do believe she is tolerating the collagen at this point. 05/03/2020 upon evaluation today patient's wound actually showed signs of good granulation at this time there does not appear to be any evidence of active infection which is great news and overall very pleased with where things stand. With that being said I do believe that the patient is can require some sharp debridement today but fortunately nothing too significant. 05/11/20 on evaluation today patient appears to be doing well in regard to her leg ulcer. She's been tolerating the dressing changes without complication.  Fortunately there is no signs of active infection at this time.  Overall I'm very pleased with where things stand. 05/18/2020 upon evaluation today patient's wound is showing signs of improvement is very dry and I think the collagen for the most part just dried to the wound bed. I am going to clean this away with sharp debridement in order to get this under control in my opinion. 05/25/2020 upon evaluation today patient actually appears to be doing well in regard to her leg ulcer. In fact upon inspection it appears she could potentially be completely healed but that is not guaranteed based on what we are seeing. There does not appear to be any signs of active infection which is great news. 05/31/2020 on evaluation today patient appears to be doing well with regard to her wounds. She in fact appears to be almost completely healed on the left leg there is just a very small opening remaining point 1 06/08/2020 upon evaluation today patient appears to be doing excellent in regard to her leg ulcer on the left in fact it appears to be that she is completely healed as of today. I see no signs of active infection at this time which is great news. No fevers, chills, nausea, vomiting, or diarrhea. READMISSION 01/16/2021 Patient that we have had in this clinic with a wound on the left posterior calf in the summer 2021 extending into October. She was felt to have chronic venous insufficiency. Per the patient's description she was discharged with what sounds like external compression stockings although she could not afford the "$75 per leg". She therefore did not get anything. Apparently her wound that she has currently is been in existence since January. She has been followed at vein and vascular with Unna boots and I think calcium alginate. She had ABIs done on 06/25/2020 that showed an noncompressible ABI on the right leg and the left leg but triphasic waveforms with good great toe pressures and a TBI of 0.90 on the  right and 0.91 on the left Surprisingly the patient also had venous reflux studies that were really unremarkable. This included no evidence of DVTs or SVTs but there was no evidence of deep venous insufficiency or superficial venous insufficiency in the greater saphenous or short saphenous veins bilaterally. I would wonder about more central venous issues or perhaps this is all lymphedema 01/25/2021 upon evaluation today patient appears to be doing decently well and guard to her wound. The good news is the Iodoflex that is job she saw Dr. Dellia Nims last week for readmission and to be honest I think that this has done extremely well over that week. I do believe that she can tolerate the sharp debridement today which will be good. I think that cleaning off the wound will likely allow [place] to be able to use a different type of dressing I do not think the Iodoflex will even be necessary based on how clean the wound looks. Fortunately there is no sign of active infection at this time which is great news. No fevers, chills, nausea, vomiting, or diarrhea. 02/12/2021 upon evaluation today patient appears to be doing well with regard to her leg ulcer. We did switch to alginate when she came for nurse visit I think is doing much better for her. Fortunately there does not appear to be any signs of active infection at this time which is great news. No fevers, chills, nausea, vomiting, or diarrhea. [name], [name] ([id number]) 02/18/2021 upon evaluation today patient's wound is actually showing signs of good improvement. I am very pleased with where things stand  currently. I do not see any signs of active infection which is great news and overall I feel like she is making good progress. The patient likewise is happy that things are doing so well and that this is measuring somewhat smaller. 02/25/2021 upon evaluation today patient actually appears to be doing quite well in regard to her wounds. She has been tolerating the  dressing changes without complication. Fortunately there does not appear to be any signs of active infection which is great news and overall I am extremely pleased with where things stand. No fevers, chills, nausea, vomiting, or diarrhea. She does have a lot of edema I really do think she needs compression socks to be worn in order to prevent things from causing additional issues for her currently. Especially on the right leg she should be wearing compression socks all the time to be honest. 03/04/2021 upon evaluation today patient's leg ulcer actually appears to be doing pretty well which is great news. There does not appear to be any significant change but overall the appearance is better even though the size is not necessarily reflecting a great improvement. Fortunately there does not appear to be any signs of active infection. No fevers, chills, nausea, vomiting, or diarrhea. 03/12/2021 upon evaluation today patient appears to be doing well with regard to her wounds. She has been tolerating the dressing changes without complication. The good news is that the wound on her left lateral leg is doing much better and is showing signs of good improvement. Overall her swelling is controlled well as well. She does need some compression socks she plans to go Jefferey socks next week. 03/18/2021 on evaluation today patient's wound actually is showing signs of good improvement. She does have a little bit of slough this can require some sharp debridement today. 03/25/2021 upon evaluation today patient appears to be doing well with regard to her wound on the left lateral leg. She has been tolerating the dressing changes without complication. Fortunately there does not appear to be any signs of active infection at this time. No fever chills no 04/01/2021 upon evaluation today patient's wound is showing signs of improvement and overall very pleased with where things stand at this point. There is no evidence of active  infection which is great news as well and in general I think that she is making great progress. 04/09/2021 upon evaluation today patient appears to be doing well with regard to her ankle ulcer. She is making good progress currently which is great news. There does not appear to be any signs of infection also excellent news. In general I am extremely pleased with where things stand today. No fevers, chills, nausea, vomiting, or diarrhea. 04/23/2021 upon inspection today patient appears to be doing quite well with regard to her leg ulcer. She has been tolerating the dressing changes without complication. She is can require some sharp debridement today but overall seems to be doing excellent. 05/06/2021 upon evaluation today patient unfortunately appears to be doing poorly overall in regard to her swelling. She is actually significantly more swollen than last week despite the fact that her wound is doing better this is not a good trend. I think that she probably needs to see her cardiologist ASAP and I discussed that with her today. This includes the fact that honestly I think she probably is getting need an increase in her diuretic or something to try to clear away some of the excess fluid that she is experiencing at the moment. She is  not been doing her pumps even twice a day but again right now more concerned that if she were to do the pump she would fluid overload her lungs causing other issues as far as her breathing is concerned. Obviously I think she needs to contact them and move up the appointment for the 12th of something sooner 2 weeks is a bit too long to wait with how swollen she has. 05/14/2021 upon evaluation today patient's wound actually showing signs of being a little bit smaller compared to previous. She has been making good progress however. Fortunately there does not appear to be any evidence of active infection which is great. Overall I am extremely pleased in that regard. Nonetheless I am  still concerned that she is having quite a bit of issue as far as the swelling is concerned she has been placed on fluid restriction as well as an increase in the furosemide by her doctor. We will see how things progress. 05/21/2021 upon evaluation today patient appears to be doing well with regard to her wound in general. She has been tolerating the dressing changes without complication. With that being said she has been using silver cell for a while and while this was showing signs of improvement it is now gotten to the point where this has slowed down quite significantly. I do believe that switching to a different dressing may be beneficial for her today. 05/28/2021 upon evaluation today patient actually appears to be doing quite well in regard to her wound. In fact there is really not any need for sharp debridement today based on what I see. The Hydrofera Blue is doing great and overall I think that she is managing quite nicely with the compression wrapping. Her edema is still down quite a bit with the wrapping in place. 06/11/2021 upon evaluation today patient appears to be doing well with regard to her leg ulcer. She has been tolerating the dressing changes without complication. Fortunately there does not appear to be any signs of active infection at this time which is great news. No fevers, chills, nausea, vomiting, or diarrhea. She is going require some sharp debridement today. 10/11; left leg ulcer much better looking and with improved surface area. She is using Hydrofera Blue under 3 layer compression. She does not have any stocking on the right leg which in itself has very significant nonpitting edema 06/25/2021 upon evaluation today patient appears to be doing well with regard to the wound on her left leg. She has been tolerating the dressing changes without complication and overall I am extremely pleased with where things stand today. I do not see any evidence of infection which is great  news. 07/02/2021 upon evaluation today patient's wound actually showing signs of excellent improvement and actually very pleased with where we stand today and the overall appearance. Fortunately there does not appear to be any signs of active infection. No fevers, chills, nausea, vomiting, or diarrhea. 07/09/2021 upon evaluation today patient appears to be doing better in regard to her wound this is measuring smaller and she is headed in the appropriate direction based on what I am seeing currently. I do not see any evidence of active infection systemically which is great news. 07/16/2021 upon evaluation today patient appears to be doing pretty well currently in regard to her leg ulcer. This is measuring smaller and looking much better this is great news. Fortunately there does not appear to be any evidence of active infection systemically which is great news as well.  Electronic Signature(s) CHAMPAYNE, KOCIAN ([id number]) Signed: 07/16/2021 2:26:03 PM By: Worthy Keeler PA-C Entered By: Worthy Keeler on 07/16/2021 14:26:02 [name], [name] ([id number]) -------------------------------------------------------------------------------- Physical Exam Details Patient Name: [name] Date of Service: 07/16/2021 1:15 PM Medical Record Number: [id number] Patient Account Number: [id number] Date of Birth/Sex: 05/28/1946 (75 y.o. F) Treating RN: Carlene Coria Primary Care Provider: [name] Other Clinician: Referring Provider: [name] Treating Provider/Extender: Skipper Cliche in Treatment: 35 Constitutional Well-nourished and well-hydrated in no acute distress. Respiratory normal breathing without difficulty. Psychiatric this patient is able to make decisions and demonstrates good insight into disease process. Alert and Oriented x 3. pleasant and cooperative. Notes Upon inspection patient's wound bed showed signs of good granulation and epithelization at this point. Fortunately  there does not appear to be any evidence of active infection systemically which is great news and overall I am extremely pleased with where we stand today. No fevers, chills, nausea, vomiting, or diarrhea. Electronic Signature(s) Signed: 07/16/2021 2:26:26 PM By: Worthy Keeler PA-C Entered By: Worthy Keeler on 07/16/2021 14:26:26 [name] ([id number]) -------------------------------------------------------------------------------- Physician Orders Details Patient Name: [name] Date of Service: 07/16/2021 1:15 PM Medical Record Number: [id number] Patient Account Number: [id number] Date of Birth/Sex: [birth year] (75 y.o. 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 Bathing/ Shower/ Hygiene o May shower with wound dressing protected with water repellent cover or cast protector. Edema Control - Lymphedema / Segmental Compressive Device / Other Left Lower Extremity o Optional: One layer of unna paste to top of compression wrap (to act as an anchor). o Tubigrip single layer applied. - RIGHT LEG tubi grip applied "E" until you get your compression sock use this and it's hand washable o Patient to wear own compression stockings. Remove compression stockings every night before going to bed and put on every morning when getting up. - on RIGHT LEG call [facility] [facility] for compression sock 15-22 mm/Hg, measurements and phone number on blue sheet [employer] on [name] in [place] also has the compression socks o Elevate, Exercise Daily and Avoid Standing for Long Periods of Time. o Elevate legs to the level of the heart and pump ankles as often as possible o Elevate leg(s) parallel to the floor when sitting. o Compression Pump: Use compression pump on left lower extremity for 60 minutes, twice daily. - You may pump over wraps o Compression Pump: Use compression pump on right lower extremity for 60 minutes, twice daily. - You may pump over wraps Additional Orders / Instructions Wound #2 Left,Lateral Lower Leg o Follow Nutritious Diet and Increase Protein Intake - Increase protein intake. Wound Treatment Wound #2 - Lower Leg Wound Laterality: Left, Lateral Cleanser: Soap and Water 1 x Per Week/30 Days Discharge Instructions: Gently cleanse wound with antibacterial soap, rinse and pat dry prior to dressing wounds Primary Dressing: Hydrofera Blue Ready Transfer Foam, 2.5x2.5 (in/in) 1 x Per Week/30 Days Discharge Instructions: Apply Hydrofera Blue Ready to wound bed as directed Secondary Dressing: ABD Pad 5x9 (in/in) 1 x Per Week/30 Days Discharge Instructions: Cover with ABD pad Compression Wrap: Profore Lite  LF 3 Multilayer Compression Bandaging System 1 x Per Week/30 Days Discharge Instructions: Apply 3 multi-layer wrap as prescribed. Electronic Signature(s) Signed: 07/16/2021 6:00:47 PM By: Worthy Keeler PA-C Signed: 07/19/2021 3:56:47 PM By: Carlene Coria RN Entered By: Carlene Coria on 07/16/2021 14:06:13 [name] ([id number]) -------------------------------------------------------------------------------- Problem List Details Patient Name: [name] Date of Service: 07/16/2021 1:15 PM Medical Record Number: [id number] Patient Account Number: [id number] Date of Birth/Sex: 1946/01/26 (75 y.o. F) Treating RN: Carlene Coria Primary Care Provider: [name] Other Clinician: Referring Provider: [name] Treating  Provider/Extender: Skipper Cliche in Treatment: 25 Active Problems ICD-10 Encounter Code Description Active Date MDM Diagnosis I89.0 Lymphedema, not elsewhere classified 01/16/2021 No Yes I87.332 Chronic venous hypertension (idiopathic) with ulcer and inflammation of 01/16/2021 No Yes left lower extremity L97.828 Non-pressure chronic ulcer of other part of left lower leg with other 01/16/2021 No Yes specified severity Inactive Problems Resolved Problems Electronic Signature(s) Signed: 07/16/2021 1:21:37 PM By: Worthy Keeler PA-C Entered By: Worthy Keeler on 07/16/2021 13:21:37 [name] ([id number]) -------------------------------------------------------------------------------- Progress Note Details Patient Name: [name] Date of Service: 07/16/2021 1:15 PM Medical Record Number: [id number] Patient Account Number: [id number] Date of Birth/Sex: 08-Oct-1945 (75 y.o. F) Treating RN: Carlene Coria Primary Care Provider: [name] Other Clinician: Referring Provider: [name] Treating Provider/Extender: Skipper Cliche in Treatment: 25 Subjective Chief Complaint Information obtained from Patient Left LE Ulcer History of Present Illness (HPI) 03/15/2020 upon evaluation today patient presents today for initial evaluation here in our clinic concerning a wound that is on the left posterior lower extremity. Unfortunately this has been giving the patient some discomfort at this point which she notes has been affected her pretty much daily. The patient does have a history of venous insufficiency, hypertension, congestive heart failure, and a history of breast cancer. Fortunately there does not appear to be evidence of active infection at this time which is great news. No fevers, chills, nausea, vomiting, or diarrhea. Wound is not extremely large but does have some slough covering the surface of the wound. This is going require sharp debridement today. 03/15/2020 upon  evaluation today patient appears to be doing fairly well in regard to her wound. She does have some slough noted on the surface of the wound currently but I do believe the Iodoflex has been beneficial over the past week. There is no signs of active infection at this time which is great news and overall very pleased with the progress. No fevers, chills, nausea, vomiting, or diarrhea. 04/02/2020 upon evaluation today patient appears to be doing a little better in regard to her wound size wise this is not tremendously smaller she does have some slough buildup on the surface of the wound. With that being said this is going require some sharp debridement today to clear away some of the necrotic debris. Fortunately there is no evidence of active infection at this time. No fevers, chills, nausea, vomiting, or diarrhea. 04/10/2020 on evaluation today patient appears to be doing well with regard to her ulcer which is measuring somewhat smaller today. She actually has more granulation tissue noted which is also good news is no need for sharp debridement today. Fortunately there is no evidence of infection either which is also excellent. No fevers, chills, nausea, vomiting, or diarrhea. 04/26/2020 on evaluation today patient's wound actually is showing signs of good improvement which is great news. There does not appear to be any evidence of active infection. I do believe she is tolerating the collagen  at this point. 05/03/2020 upon evaluation today patient's wound actually showed signs of good granulation at this time there does not appear to be any evidence of active infection which is great news and overall very pleased with where things stand. With that being said I do believe that the patient is can require some sharp debridement today but fortunately nothing too significant. 05/11/20 on evaluation today patient appears to be doing well in regard to her leg ulcer. She's been tolerating the dressing changes  without complication. Fortunately there is no signs of active infection at this time. Overall I'm very pleased with where things stand. 05/18/2020 upon evaluation today patient's wound is showing signs of improvement is very dry and I think the collagen for the most part just dried to the wound bed. I am going to clean this away with sharp debridement in order to get this under control in my opinion. 05/25/2020 upon evaluation today patient actually appears to be doing well in regard to her leg ulcer. In fact upon inspection it appears she could potentially be completely healed but that is not guaranteed based on what we are seeing. There does not appear to be any signs of active infection which is great news. 05/31/2020 on evaluation today patient appears to be doing well with regard to her wounds. She in fact appears to be almost completely healed on the left leg there is just a very small opening remaining point 1 06/08/2020 upon evaluation today patient appears to be doing excellent in regard to her leg ulcer on the left in fact it appears to be that she is completely healed as of today. I see no signs of active infection at this time which is great news. No fevers, chills, nausea, vomiting, or diarrhea. READMISSION 01/16/2021 Patient that we have had in this clinic with a wound on the left posterior calf in the summer 2021 extending into October. She was felt to have chronic venous insufficiency. Per the patient's description she was discharged with what sounds like external compression stockings although she could not afford the "$75 per leg". She therefore did not get anything. Apparently her wound that she has currently is been in existence since January. She has been followed at vein and vascular with Unna boots and I think calcium alginate. She had ABIs done on 06/25/2020 that showed an noncompressible ABI on the right leg and the left leg but triphasic waveforms with good great toe pressures  and a TBI of 0.90 on the right and 0.91 on the left Surprisingly the patient also had venous reflux studies that were really unremarkable. This included no evidence of DVTs or SVTs but there was no evidence of deep venous insufficiency or superficial venous insufficiency in the greater saphenous or short saphenous veins bilaterally. I would wonder about more central venous issues or perhaps this is all lymphedema 01/25/2021 upon evaluation today patient appears to be doing decently well and guard to her wound. The good news is the Iodoflex that is job she saw Dr. Dellia Nims last week for readmission and to be honest I think that this has done extremely well over that week. I do believe that she can tolerate the sharp debridement today which will be good. I think that cleaning off the wound will likely allow [place] to be able to use a different type of dressing I do not think the Iodoflex will even be necessary based on how clean the wound looks. Fortunately there is no sign of active infection  at this time which is great news. No fevers, chills, nausea, vomiting, or diarrhea. [name], [name] ([id number]) 02/12/2021 upon evaluation today patient appears to be doing well with regard to her leg ulcer. We did switch to alginate when she came for nurse visit I think is doing much better for her. Fortunately there does not appear to be any signs of active infection at this time which is great news. No fevers, chills, nausea, vomiting, or diarrhea. 02/18/2021 upon evaluation today patient's wound is actually showing signs of good improvement. I am very pleased with where things stand currently. I do not see any signs of active infection which is great news and overall I feel like she is making good progress. The patient likewise is happy that things are doing so well and that this is measuring somewhat smaller. 02/25/2021 upon evaluation today patient actually appears to be doing quite well in regard to her wounds.  She has been tolerating the dressing changes without complication. Fortunately there does not appear to be any signs of active infection which is great news and overall I am extremely pleased with where things stand. No fevers, chills, nausea, vomiting, or diarrhea. She does have a lot of edema I really do think she needs compression socks to be worn in order to prevent things from causing additional issues for her currently. Especially on the right leg she should be wearing compression socks all the time to be honest. 03/04/2021 upon evaluation today patient's leg ulcer actually appears to be doing pretty well which is great news. There does not appear to be any significant change but overall the appearance is better even though the size is not necessarily reflecting a great improvement. Fortunately there does not appear to be any signs of active infection. No fevers, chills, nausea, vomiting, or diarrhea. 03/12/2021 upon evaluation today patient appears to be doing well with regard to her wounds. She has been tolerating the dressing changes without complication. The good news is that the wound on her left lateral leg is doing much better and is showing signs of good improvement. Overall her swelling is controlled well as well. She does need some compression socks she plans to go Jefferey socks next week. 03/18/2021 on evaluation today patient's wound actually is showing signs of good improvement. She does have a little bit of slough this can require some sharp debridement today. 03/25/2021 upon evaluation today patient appears to be doing well with regard to her wound on the left lateral leg. She has been tolerating the dressing changes without complication. Fortunately there does not appear to be any signs of active infection at this time. No fever chills no 04/01/2021 upon evaluation today patient's wound is showing signs of improvement and overall very pleased with where things stand at this  point. There is no evidence of active infection which is great news as well and in general I think that she is making great progress. 04/09/2021 upon evaluation today patient appears to be doing well with regard to her ankle ulcer. She is making good progress currently which is great news. There does not appear to be any signs of infection also excellent news. In general I am extremely pleased with where things stand today. No fevers, chills, nausea, vomiting, or diarrhea. 04/23/2021 upon inspection today patient appears to be doing quite well with regard to her leg ulcer. She has been tolerating the dressing changes without complication. She is can require some sharp debridement today but overall seems to be doing  excellent. 05/06/2021 upon evaluation today patient unfortunately appears to be doing poorly overall in regard to her swelling. She is actually significantly more swollen than last week despite the fact that her wound is doing better this is not a good trend. I think that she probably needs to see her cardiologist ASAP and I discussed that with her today. This includes the fact that honestly I think she probably is getting need an increase in her diuretic or something to try to clear away some of the excess fluid that she is experiencing at the moment. She is not been doing her pumps even twice a day but again right now more concerned that if she were to do the pump she would fluid overload her lungs causing other issues as far as her breathing is concerned. Obviously I think she needs to contact them and move up the appointment for the 12th of something sooner 2 weeks is a bit too long to wait with how swollen she has. 05/14/2021 upon evaluation today patient's wound actually showing signs of being a little bit smaller compared to previous. She has been making good progress however. Fortunately there does not appear to be any evidence of active infection which is great. Overall I am extremely  pleased in that regard. Nonetheless I am still concerned that she is having quite a bit of issue as far as the swelling is concerned she has been placed on fluid restriction as well as an increase in the furosemide by her doctor. We will see how things progress. 05/21/2021 upon evaluation today patient appears to be doing well with regard to her wound in general. She has been tolerating the dressing changes without complication. With that being said she has been using silver cell for a while and while this was showing signs of improvement it is now gotten to the point where this has slowed down quite significantly. I do believe that switching to a different dressing may be beneficial for her today. 05/28/2021 upon evaluation today patient actually appears to be doing quite well in regard to her wound. In fact there is really not any need for sharp debridement today based on what I see. The Hydrofera Blue is doing great and overall I think that she is managing quite nicely with the compression wrapping. Her edema is still down quite a bit with the wrapping in place. 06/11/2021 upon evaluation today patient appears to be doing well with regard to her leg ulcer. She has been tolerating the dressing changes without complication. Fortunately there does not appear to be any signs of active infection at this time which is great news. No fevers, chills, nausea, vomiting, or diarrhea. She is going require some sharp debridement today. 10/11; left leg ulcer much better looking and with improved surface area. She is using Hydrofera Blue under 3 layer compression. She does not have any stocking on the right leg which in itself has very significant nonpitting edema 06/25/2021 upon evaluation today patient appears to be doing well with regard to the wound on her left leg. She has been tolerating the dressing changes without complication and overall I am extremely pleased with where things stand today. I do not see  any evidence of infection which is great news. 07/02/2021 upon evaluation today patient's wound actually showing signs of excellent improvement and actually very pleased with where we stand today and the overall appearance. Fortunately there does not appear to be any signs of active infection. No fevers, chills, nausea, vomiting,  or diarrhea. 07/09/2021 upon evaluation today patient appears to be doing better in regard to her wound this is measuring smaller and she is headed in the appropriate direction based on what I am seeing currently. I do not see any evidence of active infection systemically which is great news. 07/16/2021 upon evaluation today patient appears to be doing pretty well currently in regard to her leg ulcer. This is measuring smaller and looking much better this is great news. Fortunately there does not appear to be any evidence of active infection systemically which is great news [name], [name] ([id number]) as well. Objective Constitutional Well-nourished and well-hydrated in no acute distress. Vitals Time Taken: 1:58 PM, Height: 69 in, Weight: 244 lbs, BMI: 36, Temperature: 98.5 F, Pulse: 64 bpm, Respiratory Rate: 18 breaths/min, Blood Pressure: 157/73 mmHg. Respiratory normal breathing without difficulty. Psychiatric this patient is able to make decisions and demonstrates good insight into disease process. Alert and Oriented x 3. pleasant and cooperative. General Notes: Upon inspection patient's wound bed showed signs of good granulation and epithelization at this point. Fortunately there does not appear to be any evidence of active infection systemically which is great news and overall I am extremely pleased with where we stand today. No fevers, chills, nausea, vomiting, or diarrhea. Integumentary (Hair, Skin) Wound #2 status is Open. Original cause of wound was Gradually Appeared. The date acquired was: 09/08/2020. The wound has been in treatment 25 weeks. The wound  is located on the Left,Lateral Lower Leg. The wound measures 1.5cm length x 0.5cm width x 0.1cm depth; 0.589cm^2 area and 0.059cm^3 volume. There is Fat Layer (Subcutaneous Tissue) exposed. There is no tunneling or undermining noted. There is a medium amount of serosanguineous drainage noted. There is large (67-100%) red granulation within the wound bed. There is no necrotic tissue within the wound bed. 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 There was a Three Layer Compression Therapy Procedure by Carlene Coria, RN. Post procedure Diagnosis Wound #2: Same as Pre-Procedure Plan Follow-up Appointments: Return Appointment in 1 week. Bathing/ Shower/ Hygiene: May shower with wound dressing protected with water repellent cover or cast protector. Edema Control - Lymphedema / Segmental Compressive Device / Other: Optional: One layer of unna paste to top of compression wrap (to act as an anchor). [name], [name] ([id number]) Tubigrip single layer applied. - RIGHT LEG tubi grip applied "E" until you get your compression sock use this and it's hand washable Patient to wear own compression stockings. Remove compression stockings every night before going to bed and put on every morning when getting up. - on RIGHT LEG call [facility] for compression sock 15-22 mm/Hg, measurements and phone number on blue sheet [employer] on [employer] in [place] also has the compression socks Elevate, Exercise Daily and Avoid Standing for Long Periods of Time. Elevate legs to the level of the heart and pump ankles as often as possible Elevate leg(s) parallel to the floor when  sitting. Compression Pump: Use compression pump on left lower extremity for 60 minutes, twice daily. - You may pump over wraps Compression Pump: Use compression pump on right lower extremity for 60 minutes, twice daily. - You may pump over wraps Additional Orders / Instructions: Wound #2 Left,Lateral  Lower Leg: Follow Nutritious Diet and Increase Protein Intake - Increase protein intake. WOUND #2: - Lower Leg Wound Laterality: Left, Lateral Cleanser: Soap and Water 1 x Per Week/30 Days Discharge Instructions: Gently cleanse wound with antibacterial soap, rinse and pat dry prior to dressing wounds Primary Dressing: Hydrofera Blue Ready Transfer Foam, 2.5x2.5 (in/in) 1 x Per Week/30 Days Discharge Instructions: Apply Hydrofera Blue Ready to wound bed as directed Secondary Dressing: ABD Pad 5x9 (in/in) 1 x Per Week/30 Days Discharge Instructions: Cover with ABD pad Compression Wrap: Profore Lite LF 3 Multilayer Compression Bandaging System 1 x Per Week/30 Days Discharge Instructions: Apply 3 multi-layer wrap as prescribed. 1. Would recommend that we going continue with the wound care measures as before and the patient is in agreement with plan. This includes the use of the [facility] dressing which I think is doing a good job. 2. I am also can recommend that we continue with a 3 layer compression wrap which I think is also doing excellent as far as helping with edema control. We will see patient back for reevaluation in 1 week here in the clinic. If anything worsens or changes patient will contact our office for additional recommendations. Electronic Signature(s) Signed: 07/16/2021 2:26:53 PM By: Worthy Keeler PA-C Entered By: Worthy Keeler on 07/16/2021 14:26:52 [name] ([id number]) -------------------------------------------------------------------------------- SuperBill Details Patient Name: [name] Date of Service: 07/16/2021 Medical Record Number:  [id number] Patient Account Number: [id number] Date of Birth/Sex: Nov 10, 1945 (75 y.o. F) Treating RN: Carlene Coria Primary Care Provider: [name] Other Clinician: Referring Provider: [name] Treating Provider/Extender: Skipper Cliche in Treatment: 25 Diagnosis Coding ICD-10 Codes Code Description I89.0 Lymphedema, not elsewhere classified I87.332 Chronic venous hypertension (idiopathic) with ulcer and inflammation of left lower extremity L97.828 Non-pressure chronic ulcer of other part of left lower leg with other specified severity Facility Procedures CPT4 Code: [id number] Description: (Facility Use Only) [phone] - [facility] LWR LT LEG Modifier: Quantity: 1 Physician Procedures CPT4 Code Description: [id number] - WC PHYS LEVEL 3 - EST PT Modifier: Quantity: 1 CPT4 Code Description: ICD-10 Diagnosis Description I89.0 Lymphedema, not elsewhere classified I87.332 Chronic venous hypertension (idiopathic) with ulcer and inflammation of le L97.828 Non-pressure chronic ulcer of other part of left lower leg with  other spec Modifier: ft lower extremity ified severity Quantity: Electronic Signature(s) Signed: 07/16/2021 2:27:08 PM By: Worthy Keeler PA-C Entered By: Worthy Keeler on 07/16/2021 14:27:07

## 2021-07-16 NOTE — Progress Notes (Addendum)
[place], DODGEN ([id number]) Visit Report for 07/16/2021 Arrival Information Details Patient Name: [name], [name] Date of Service: 07/16/2021 1:15 PM Medical Record Number: [id number] Patient Account Number: [id number] Date of Birth/Sex: 1946/08/21 (75 y.o. F) Treating RN: Carlene Coria Primary Care Ayesha Markwell: [name] Other Clinician: Referring Jolon Degante: [name] Treating Yanis Juma/Extender: Skipper Cliche in Treatment: 25 Visit Information History Since Last Visit All ordered tests and consults were completed: No Patient Arrived: Wheel Chair Added or deleted any medications: No Arrival Time: 13:42 Any new allergies or adverse reactions: No Accompanied By: self Had a fall or experienced change in No Transfer Assistance: None activities of daily living that may affect Patient Identification Verified: Yes risk of falls: Secondary Verification Process Completed: Yes Signs or symptoms of abuse/neglect since last visito No Patient Requires Transmission-Based Precautions: No Hospitalized since last visit: No Patient Has Alerts: Yes Implantable device outside of the clinic excluding No Patient Alerts: NOT DIABETIC cellular tissue based products placed in the center since last visit: Has Dressing in Place as Prescribed: Yes Has Compression in Place as Prescribed: Yes Pain Present Now: No Electronic Signature(s) Signed: 07/19/2021 3:56:47 PM By: Carlene Coria RN Entered By: Carlene Coria on 07/16/2021 13:58:54 [name] ([id number]) -------------------------------------------------------------------------------- Clinic Level of Care Assessment Details Patient Name: [name] Date of Service: 07/16/2021 1:15 PM Medical Record Number: [id number] Patient Account Number: [id number] Date of Birth/Sex: 14-Sep-1945 (75 y.o. F) Treating RN: Carlene Coria Primary Care Crissie Aloi: [name] Other Clinician: Referring Javon Hupfer: [name] Treating Eward Rutigliano/Extender:  Skipper Cliche in Treatment: 25 Clinic Level of Care Assessment Items TOOL 1 Quantity Score []  - Use when EandM and Procedure is performed on INITIAL visit 0 ASSESSMENTS - Nursing Assessment / Reassessment []  - General Physical Exam (combine w/ comprehensive assessment (listed just below) when performed on new 0 pt. evals) []  - 0 Comprehensive Assessment (HX, ROS, Risk Assessments, Wounds Hx, etc.) ASSESSMENTS - Wound and Skin Assessment / Reassessment []  - Dermatologic / Skin Assessment (not related to wound area) 0 ASSESSMENTS - Ostomy and/or Continence Assessment and Care []  - Incontinence Assessment and Management 0 []  - 0 Ostomy Care Assessment and Management (repouching, etc.) PROCESS - Coordination of Care []  - Simple Patient / Family Education for ongoing care 0 []  - 0 Complex (extensive) Patient / Family Education for ongoing care []  - 0 Staff obtains Programmer, systems, Records, Test Results / Process Orders []  - 0 Staff telephones HHA, Nursing Homes / Clarify orders / etc []  - 0 Routine Transfer to another Facility (non-emergent condition) []  - 0 Routine Hospital Admission (non-emergent condition) []  - 0 New Admissions / Biomedical engineer / Ordering NPWT, Apligraf, etc. []  - 0 Emergency Hospital Admission (emergent condition) PROCESS - Special Needs []  - Pediatric / Minor Patient Management 0 []  - 0 Isolation Patient Management []  - 0 Hearing / Language / Visual special needs []  - 0 Assessment of Community assistance (transportation, D/C planning, etc.) []  - 0 Additional assistance / Altered mentation []  - 0 Support Surface(s) Assessment (bed, cushion, seat, etc.) INTERVENTIONS - Miscellaneous []  - External ear exam 0 []  - 0 Patient Transfer (multiple staff / Civil Service fast streamer / Similar devices) []  - 0 Simple Staple / Suture removal (25 or less) []  - 0 Complex Staple / Suture removal (26 or more) []  - 0 Hypo/Hyperglycemic Management (do not check if billed  separately) []  - 0 Ankle / Brachial Index (ABI) - do not check if billed separately Has the patient been seen at the hospital within the  last three years: Yes Total Score: 0 Level Of Care: ____ [name] ([id number]) Electronic Signature(s) Signed: 07/19/2021 3:56:47 PM By: Carlene Coria RN Entered By: Carlene Coria on 07/16/2021 14:17:55 [name] ([id number]) -------------------------------------------------------------------------------- Compression Therapy Details Patient Name: [name] Date of Service: 07/16/2021 1:15 PM Medical Record Number: [id number] Patient Account Number: [id number] Date of Birth/Sex: 07/29/1946 (75 y.o. F) Treating RN: Carlene Coria Primary Care Madie Cahn: [name] Other Clinician: Referring Jatavius Ellenwood: [name] Treating Faiza Bansal/Extender: Skipper Cliche in Treatment: 25 Compression Therapy Performed for Wound Assessment: Wound #2 Left,Lateral Lower Leg Performed By: Clinician Carlene Coria, RN Compression Type: Three Layer Post Procedure Diagnosis Same as Pre-procedure Electronic Signature(s) Signed: 07/19/2021 3:56:47 PM By: Carlene Coria RN Entered By: Carlene Coria on 07/16/2021 14:05:22 [name] ([id number]) -------------------------------------------------------------------------------- Encounter Discharge Information Details Patient Name: [name] Date of Service: 07/16/2021 1:15 PM Medical Record Number: [id number] Patient Account Number: [id number] Date of Birth/Sex: 03-14-1946 (75 y.o. F) Treating RN: Carlene Coria Primary Care Kelce Bouton: [name] Other Clinician: Referring Reagan Behlke: [name] Treating Paisyn Guercio/Extender: Skipper Cliche in Treatment: 25 Encounter Discharge Information Items Discharge Condition: Stable Ambulatory Status: Wheelchair Discharge Destination: Home Transportation: Private Auto Accompanied By: self Schedule Follow-up Appointment: Yes Clinical Summary of Care:  Patient Declined Electronic Signature(s) Signed: 07/19/2021 3:56:47 PM By: Carlene Coria RN Entered By: Carlene Coria on 07/16/2021 14:19:10 [name] ([id number]) -------------------------------------------------------------------------------- Lower Extremity Assessment Details Patient Name: [name] Date of Service: 07/16/2021 1:15 PM Medical Record Number: [id number] Patient Account Number: [id number] Date of Birth/Sex: 10/05/1945 (75 y.o. F) Treating RN: Carlene Coria Primary Care Antwanette Wesche: [name] Other Clinician: Referring Adeana Grilliot: [name] Treating Seven Dollens/Extender: Skipper Cliche in Treatment: 25 Edema Assessment Assessed: [Left: No] [Right: No] Edema: [Left: Ye] [Right: s] Calf Left: Right: Point of Measurement: 27 cm From Medial Instep 36 cm Ankle Left: Right: Point of Measurement: 10 cm From Medial Instep 25 cm Vascular Assessment Pulses: Dorsalis Pedis Palpable: [Left:Yes] Electronic Signature(s) Signed: 07/19/2021 3:56:47 PM By: Carlene Coria RN Entered By: Carlene Coria on 07/16/2021 14:01:26 [name] ([id number]) -------------------------------------------------------------------------------- Multi Wound Chart Details Patient Name: [name] Date of Service: 07/16/2021 1:15 PM Medical Record Number: [id number] Patient Account Number: [id number] Date of Birth/Sex: 01-01-1946 (75 y.o. F) Treating RN: Carlene Coria Primary Care Gaius Ishaq: [name] Other Clinician: Referring Darionna Banke: [name] Treating Keiosha Cancro/Extender: Skipper Cliche in Treatment: 25 Vital Signs Height(in): 69 Pulse(bpm): 67 Weight(lbs): 244 Blood Pressure(mmHg): 157/73 Body Mass Index(BMI): 36 Temperature(F): 98.5 Respiratory Rate(breaths/min): 18 Photos: [N/A:N/A] Wound Location: Left, Lateral Lower Leg N/A N/A Wounding Event: Gradually Appeared N/A N/A Primary Etiology: Venous Leg Ulcer N/A N/A Comorbid History: Cataracts,  Lymphedema, Sleep N/A N/A Apnea, Congestive Heart Failure, Hypertension, Osteoarthritis, Neuropathy, Received Chemotherapy, Received Radiation Date Acquired: 09/08/2020 N/A N/A Weeks of Treatment: 25 N/A N/A Wound Status: Open N/A N/A Measurements L x W x D (cm) 1.5x0.5x0.1 N/A N/A Area (cm) : 0.589 N/A N/A Volume (cm) : 0.059 N/A N/A % Reduction in Area: 87.50% N/A N/A % Reduction in Volume: 87.50% N/A N/A Classification: Full Thickness Without Exposed N/A N/A Support Structures Exudate Amount: Medium N/A N/A Exudate Type: Serosanguineous N/A N/A Exudate Color: red, brown N/A N/A Granulation Amount: Large (67-100%) N/A N/A Granulation Quality: Red N/A N/A Necrotic Amount: None Present (0%) N/A N/A Exposed Structures: Fat Layer (Subcutaneous Tissue): N/A N/A Yes Fascia: No Tendon: No Muscle: No Joint: No Bone: No Epithelialization: Small (1-33%) N/A N/A Treatment Notes Electronic Signature(s) Signed: 07/19/2021 3:56:47 PM By: Carlene Coria RN  Entered By: Carlene Coria on 07/16/2021 14:04:46 [name] ([id number]) -------------------------------------------------------------------------------- Multi-Disciplinary Care Plan Details Patient Name: TIFFENY, MINCHEW Date of Service: 07/16/2021 1:15 PM Medical Record Number: [id number] Patient Account Number: [id number] Date of Birth/Sex: [birth year] (75 y.o. F) Treating RN: Carlene Coria Primary Care Sedra Morfin: [name] Other Clinician: Referring Sander Remedios: [name] Treating Lylia Karn/Extender: Skipper Cliche in Treatment: 25 Active Inactive Wound/Skin Impairment Nursing Diagnoses: Impaired tissue integrity Goals: Patient/caregiver will verbalize understanding of skin care regimen Date Initiated: 01/16/2021 Date Inactivated: 02/18/2021 Target Resolution Date: 02/16/2021 Goal Status: Met Ulcer/skin breakdown will have a volume reduction of 30% by week 4 Date Initiated: 01/16/2021 Date Inactivated:  02/18/2021 Target Resolution Date: 02/16/2021 Goal Status: Met Ulcer/skin breakdown will have a volume reduction of 50% by week 8 Date Initiated: 01/16/2021 Date Inactivated: 04/01/2021 Target Resolution Date: 03/18/2021 Goal Status: Unmet Unmet Reason: comorbities Ulcer/skin breakdown will have a volume reduction of 80% by week 12 Date Initiated: 04/01/2021 Date Inactivated: 04/23/2021 Target Resolution Date: 04/18/2021 Goal Status: Unmet Unmet Reason: comorbities Ulcer/skin breakdown will heal within 14 weeks Date Initiated: 04/01/2021 Target Resolution Date: 05/19/2021 Goal Status: Active Interventions: Assess ulceration(s) every visit Treatment Activities: Topical wound management initiated : 01/16/2021 Notes: Electronic Signature(s) Signed: 07/19/2021 3:56:47 PM By: Carlene Coria RN Entered By: Carlene Coria on 07/16/2021 14:04:30 [name] ([id number]) -------------------------------------------------------------------------------- Pain Assessment Details Patient Name: [name] Date of Service: 07/16/2021 1:15 PM Medical Record Number: [id number] Patient Account Number: [id number] Date of Birth/Sex: 03/04/1946 (75 y.o. F) Treating RN: Carlene Coria Primary Care Analayah Brooke: [name] Other Clinician: Referring Aarian Cleaver: [name] Treating Treyshon Buchanon/Extender: Skipper Cliche in Treatment: 25 Active Problems Location of Pain Severity and Description of Pain Patient Has Paino No Site Locations Pain Management and Medication Current Pain Management: Electronic Signature(s) Signed: 07/19/2021 3:56:47 PM By: Carlene Coria RN Entered By: Carlene Coria on 07/16/2021 13:59:31 [name] ([id number]) -------------------------------------------------------------------------------- Patient/Caregiver Education Details Patient Name: [name] Date of Service: 07/16/2021 1:15 PM Medical Record Number: [id number] Patient Account Number: [id number] Date of  Birth/Gender: Mar 30, 1946 (75 y.o. F) Treating RN: Carlene Coria Primary Care Physician: [name] Other Clinician: Referring Physician: [name] Treating Physician/Extender: Skipper Cliche in Treatment: 25 Education Assessment Education Provided To: Patient Education Topics Provided Wound/Skin Impairment: Methods: Explain/Verbal Responses: State content correctly Electronic Signature(s) Signed: 07/19/2021 3:56:47 PM By: Carlene Coria RN Entered By: Carlene Coria on 07/16/2021 14:18:19 [name] ([id number]) -------------------------------------------------------------------------------- Wound Assessment Details Patient Name: [name] Date of Service: 07/16/2021 1:15 PM Medical Record Number: [id number] Patient Account Number: [id number] Date of Birth/Sex: 1946/03/10 (75 y.o. F) Treating RN: Carlene Coria Primary Care Obediah Welles: [name] Other Clinician: Referring Kamuela Magos: [name] Treating La Shehan/Extender: Skipper Cliche in Treatment: 25 Wound Status Wound Number: 2 Primary Venous Leg Ulcer Etiology: Wound Location: Left, Lateral Lower Leg Wound Open Wounding Event: Gradually Appeared Status: Date Acquired: 09/08/2020 Comorbid Cataracts, Lymphedema, Sleep Apnea, Congestive Heart Weeks Of Treatment: 25 History: Failure, Hypertension, Osteoarthritis, Neuropathy, Clustered Wound: No Received Chemotherapy, Received Radiation Photos Wound Measurements Length: (cm) 1.5 Width: (cm) 0.5 Depth: (cm) 0.1 Area: (cm) 0.589 Volume: (cm) 0.059 % Reduction in Area: 87.5% % Reduction in Volume: 87.5% Epithelialization: Small (1-33%) Tunneling: No Undermining: No Wound Description Classification: Full Thickness Without Exposed Support Structu Exudate Amount: Medium Exudate Type: Serosanguineous Exudate Color: red, brown res Foul Odor After Cleansing: No Slough/Fibrino No Wound Bed Granulation Amount: Large (67-100%) Exposed  Structure Granulation Quality: Red Fascia Exposed: No Necrotic Amount: None Present (0%) Fat Layer (Subcutaneous Tissue) Exposed:  Yes Tendon Exposed: No Muscle Exposed: No Joint Exposed: No Bone Exposed: No Treatment Notes Wound #2 (Lower Leg) Wound Laterality: Left, Lateral Cleanser Soap and Water Discharge Instruction: Gently cleanse wound with antibacterial soap, rinse and pat dry prior to dressing wounds Peri-Wound Care ALAYNAH, SCHUTTER ([id number]) Topical Primary Dressing Hydrofera Blue Ready Transfer Foam, 2.5x2.5 (in/in) Discharge Instruction: Apply Hydrofera Blue Ready to wound bed as directed Secondary Dressing ABD Pad 5x9 (in/in) Discharge Instruction: Cover with ABD pad Secured With Compression Wrap Profore Lite LF 3 Multilayer Compression [place] Discharge Instruction: Apply 3 multi-layer wrap as prescribed. Compression Stockings Add-Ons Electronic Signature(s) Signed: 07/19/2021 3:56:47 PM By: Carlene Coria RN Entered By: Carlene Coria on 07/16/2021 14:00:26 [name] ([id number]) -------------------------------------------------------------------------------- Vitals Details Patient Name: [name] Date of Service: 07/16/2021 1:15 PM Medical Record Number: [id number] Patient Account Number: [id number] Date of Birth/Sex: 06-12-1946 (75 y.o. F) Treating RN: Carlene Coria Primary Care Kolbie Lepkowski: [name] Other Clinician: Referring Darlen Gledhill: [name] Treating Nilson Tabora/Extender: Skipper Cliche in Treatment: 25 Vital Signs Time Taken: 13:58 Temperature (F): 98.5 Height (in): 69 Pulse (bpm): 64 Weight (lbs): 244 Respiratory Rate (breaths/min): 18 Body Mass Index (BMI): 36 Blood Pressure (mmHg): 157/73 Reference Range: 80 - 120 mg / dl Electronic Signature(s) Signed: 07/19/2021 3:56:47 PM By: Carlene Coria RN Entered By: Carlene Coria on 07/16/2021 13:59:15

## 2021-07-23 ENCOUNTER — Encounter: Payer: Medicare Other | Admitting: Physician Assistant

## 2021-07-23 ENCOUNTER — Other Ambulatory Visit: Payer: Self-pay

## 2021-07-23 DIAGNOSIS — I890 Lymphedema, not elsewhere classified: Secondary | ICD-10-CM | POA: Diagnosis not present

## 2021-07-23 NOTE — Progress Notes (Addendum)
CHARLE, MCLAURIN ([id number]) Visit Report for 07/23/2021 Chief Complaint Document Details Patient Name: [name], [name] Date of Service: 07/23/2021 1:15 PM Medical Record Number: [id number] Patient Account Number: [id number] Date of Birth/Sex: 04/26/1946 (75 y.o. F) Treating RN: Carlene Coria Primary Care Provider: [name] Other Clinician: Referring Provider: [name] Treating Provider/Extender: Skipper Cliche in Treatment: 26 Information Obtained from: Patient Chief Complaint Left LE Ulcer Electronic Signature(s) Signed: 07/23/2021 1:16:58 PM By: Worthy Keeler PA-C Entered By: Worthy Keeler on 07/23/2021 13:16:57 [name] ([id number]) -------------------------------------------------------------------------------- HPI Details Patient Name: [name] Date of Service: 07/23/2021 1:15 PM Medical Record Number: [id number] Patient Account Number: [id number] Date of Birth/Sex: 1946-07-25 (75 y.o. F) Treating RN: Carlene Coria Primary Care Provider: [name] Other Clinician: Referring Provider: [name] Treating Provider/Extender: Skipper Cliche in Treatment: 26 History of Present Illness HPI Description: 03/15/2020 upon evaluation today patient presents today for initial evaluation here in our clinic concerning a wound that is on the left posterior lower extremity. Unfortunately this has been giving the patient some discomfort at this point which she notes has been affected her pretty much daily. The patient does have a history of venous insufficiency, hypertension, congestive heart failure, and a history of breast cancer. Fortunately there does not appear to be evidence of active infection at this time which is great news. No fevers, chills, nausea, vomiting, or diarrhea. Wound is not extremely large but does have some slough covering the surface of the wound. This is going require sharp debridement today. 03/15/2020 upon evaluation today patient  appears to be doing fairly well in regard to her wound. She does have some slough noted on the surface of the wound currently but I do believe the Iodoflex has been beneficial over the past week. There is no signs of active infection at this time which is great news and overall very pleased with the progress. No fevers, chills, nausea, vomiting, or diarrhea. 04/02/2020 upon evaluation today patient appears to be doing a little better in regard to her wound size wise this is not tremendously smaller she does have some slough buildup on the surface of the wound. With that being said this is going require some sharp debridement today to clear away some of the necrotic debris. Fortunately there is no evidence of active infection at this time. No fevers, chills, nausea, vomiting, or diarrhea. 04/10/2020 on evaluation today patient appears to be doing well with regard to her ulcer which is measuring somewhat smaller today. She actually has more granulation tissue noted which is also good news is no need for sharp debridement today. Fortunately there is no evidence of infection either which is also excellent. No fevers, chills, nausea, vomiting, or diarrhea. 04/26/2020 on evaluation today patient's wound actually is showing signs of good improvement which is great news. There does not appear to be any evidence of active infection. I do believe she is tolerating the collagen at this point. 05/03/2020 upon evaluation today patient's wound actually showed signs of good granulation at this time there does not appear to be any evidence of active infection which is great news and overall very pleased with where things stand. With that being said I do believe that the patient is can require some sharp debridement today but fortunately nothing too significant. 05/11/20 on evaluation today patient appears to be doing well in regard to her leg ulcer. She's been tolerating the dressing changes without complication.  Fortunately there is no signs of active infection at this time.  Overall I'm very pleased with where things stand. 05/18/2020 upon evaluation today patient's wound is showing signs of improvement is very dry and I think the collagen for the most part just dried to the wound bed. I am going to clean this away with sharp debridement in order to get this under control in my opinion. 05/25/2020 upon evaluation today patient actually appears to be doing well in regard to her leg ulcer. In fact upon inspection it appears she could potentially be completely healed but that is not guaranteed based on what we are seeing. There does not appear to be any signs of active infection which is great news. 05/31/2020 on evaluation today patient appears to be doing well with regard to her wounds. She in fact appears to be almost completely healed on the left leg there is just a very small opening remaining point 1 06/08/2020 upon evaluation today patient appears to be doing excellent in regard to her leg ulcer on the left in fact it appears to be that she is completely healed as of today. I see no signs of active infection at this time which is great news. No fevers, chills, nausea, vomiting, or diarrhea. READMISSION 01/16/2021 Patient that we have had in this clinic with a wound on the left posterior calf in the summer 2021 extending into October. She was felt to have chronic venous insufficiency. Per the patient's description she was discharged with what sounds like external compression stockings although she could not afford the "$75 per leg". She therefore did not get anything. Apparently her wound that she has currently is been in existence since January. She has been followed at vein and vascular with Unna boots and I think calcium alginate. She had ABIs done on 06/25/2020 that showed an noncompressible ABI on the right leg and the left leg but triphasic waveforms with good great toe pressures and a TBI of 0.90 on the  right and 0.91 on the left Surprisingly the patient also had venous reflux studies that were really unremarkable. This included no evidence of DVTs or SVTs but there was no evidence of deep venous insufficiency or superficial venous insufficiency in the greater saphenous or short saphenous veins bilaterally. I would wonder about more central venous issues or perhaps this is all lymphedema 01/25/2021 upon evaluation today patient appears to be doing decently well and guard to her wound. The good news is the Iodoflex that is job she saw Dr. Dellia Nims last week for readmission and to be honest I think that this has done extremely well over that week. I do believe that she can tolerate the sharp debridement today which will be good. I think that cleaning off the wound will likely allow [place] to be able to use a different type of dressing I do not think the Iodoflex will even be necessary based on how clean the wound looks. Fortunately there is no sign of active infection at this time which is great news. No fevers, chills, nausea, vomiting, or diarrhea. 02/12/2021 upon evaluation today patient appears to be doing well with regard to her leg ulcer. We did switch to alginate when she came for nurse visit I think is doing much better for her. Fortunately there does not appear to be any signs of active infection at this time which is great news. No fevers, chills, nausea, vomiting, or diarrhea. CENA, BRUHN ([id number]) 02/18/2021 upon evaluation today patient's wound is actually showing signs of good improvement. I am very pleased with where things stand  currently. I do not see any signs of active infection which is great news and overall I feel like she is making good progress. The patient likewise is happy that things are doing so well and that this is measuring somewhat smaller. 02/25/2021 upon evaluation today patient actually appears to be doing quite well in regard to her wounds. She has been tolerating the  dressing changes without complication. Fortunately there does not appear to be any signs of active infection which is great news and overall I am extremely pleased with where things stand. No fevers, chills, nausea, vomiting, or diarrhea. She does have a lot of edema I really do think she needs compression socks to be worn in order to prevent things from causing additional issues for her currently. Especially on the right leg she should be wearing compression socks all the time to be honest. 03/04/2021 upon evaluation today patient's leg ulcer actually appears to be doing pretty well which is great news. There does not appear to be any significant change but overall the appearance is better even though the size is not necessarily reflecting a great improvement. Fortunately there does not appear to be any signs of active infection. No fevers, chills, nausea, vomiting, or diarrhea. 03/12/2021 upon evaluation today patient appears to be doing well with regard to her wounds. She has been tolerating the dressing changes without complication. The good news is that the wound on her left lateral leg is doing much better and is showing signs of good improvement. Overall her swelling is controlled well as well. She does need some compression socks she plans to go Jefferey socks next week. 03/18/2021 on evaluation today patient's wound actually is showing signs of good improvement. She does have a little bit of slough this can require some sharp debridement today. 03/25/2021 upon evaluation today patient appears to be doing well with regard to her wound on the left lateral leg. She has been tolerating the dressing changes without complication. Fortunately there does not appear to be any signs of active infection at this time. No fever chills no 04/01/2021 upon evaluation today patient's wound is showing signs of improvement and overall very pleased with where things stand at this point. There is no evidence of active  infection which is great news as well and in general I think that she is making great progress. 04/09/2021 upon evaluation today patient appears to be doing well with regard to her ankle ulcer. She is making good progress currently which is great news. There does not appear to be any signs of infection also excellent news. In general I am extremely pleased with where things stand today. No fevers, chills, nausea, vomiting, or diarrhea. 04/23/2021 upon inspection today patient appears to be doing quite well with regard to her leg ulcer. She has been tolerating the dressing changes without complication. She is can require some sharp debridement today but overall seems to be doing excellent. 05/06/2021 upon evaluation today patient unfortunately appears to be doing poorly overall in regard to her swelling. She is actually significantly more swollen than last week despite the fact that her wound is doing better this is not a good trend. I think that she probably needs to see her cardiologist ASAP and I discussed that with her today. This includes the fact that honestly I think she probably is getting need an increase in her diuretic or something to try to clear away some of the excess fluid that she is experiencing at the moment. She is  not been doing her pumps even twice a day but again right now more concerned that if she were to do the pump she would fluid overload her lungs causing other issues as far as her breathing is concerned. Obviously I think she needs to contact them and move up the appointment for the 12th of something sooner 2 weeks is a bit too long to wait with how swollen she has. 05/14/2021 upon evaluation today patient's wound actually showing signs of being a little bit smaller compared to previous. She has been making good progress however. Fortunately there does not appear to be any evidence of active infection which is great. Overall I am extremely pleased in that regard. Nonetheless I am  still concerned that she is having quite a bit of issue as far as the swelling is concerned she has been placed on fluid restriction as well as an increase in the furosemide by her doctor. We will see how things progress. 05/21/2021 upon evaluation today patient appears to be doing well with regard to her wound in general. She has been tolerating the dressing changes without complication. With that being said she has been using silver cell for a while and while this was showing signs of improvement it is now gotten to the point where this has slowed down quite significantly. I do believe that switching to a different dressing may be beneficial for her today. 05/28/2021 upon evaluation today patient actually appears to be doing quite well in regard to her wound. In fact there is really not any need for sharp debridement today based on what I see. The Hydrofera Blue is doing great and overall I think that she is managing quite nicely with the compression wrapping. Her edema is still down quite a bit with the wrapping in place. 06/11/2021 upon evaluation today patient appears to be doing well with regard to her leg ulcer. She has been tolerating the dressing changes without complication. Fortunately there does not appear to be any signs of active infection at this time which is great news. No fevers, chills, nausea, vomiting, or diarrhea. She is going require some sharp debridement today. 10/11; left leg ulcer much better looking and with improved surface area. She is using Hydrofera Blue under 3 layer compression. She does not have any stocking on the right leg which in itself has very significant nonpitting edema 06/25/2021 upon evaluation today patient appears to be doing well with regard to the wound on her left leg. She has been tolerating the dressing changes without complication and overall I am extremely pleased with where things stand today. I do not see any evidence of infection which is great  news. 07/02/2021 upon evaluation today patient's wound actually showing signs of excellent improvement and actually very pleased with where we stand today and the overall appearance. Fortunately there does not appear to be any signs of active infection. No fevers, chills, nausea, vomiting, or diarrhea. 07/09/2021 upon evaluation today patient appears to be doing better in regard to her wound this is measuring smaller and she is headed in the appropriate direction based on what I am seeing currently. I do not see any evidence of active infection systemically which is great news. 07/16/2021 upon evaluation today patient appears to be doing pretty well currently in regard to her leg ulcer. This is measuring smaller and looking much better this is great news. Fortunately there does not appear to be any evidence of active infection systemically which is great news as well.  07/23/2021 upon evaluation today patient's wound is actually showing signs of good improvement this is definitely measuring smaller. I do not Sylvester, Claudie ([id number]) see any signs of infection which is great news and overall very pleased in that regard. Overall I think that she is doing well with the [facility]. Electronic Signature(s) Signed: 07/23/2021 2:12:20 PM By: Worthy Keeler PA-C Entered By: Worthy Keeler on 07/23/2021 14:12:19 MELROSE, KEARSE ([id number]) -------------------------------------------------------------------------------- Physical Exam Details Patient Name: [name] Date of Service: 07/23/2021 1:15 PM Medical Record Number: [id number] Patient Account Number: [id number] Date of Birth/Sex: 03-Dec-1945 (75 y.o. F) Treating RN: Carlene Coria Primary Care Provider: [name] Other Clinician: Referring Provider: [name] Treating Provider/Extender: Skipper Cliche in Treatment: 26 Constitutional Obese and well-hydrated in no acute distress. Respiratory normal breathing without  difficulty. Cardiovascular 1+ dorsalis pedis/posterior tibialis pulses. 2+ pitting edema of the bilateral lower extremities. Psychiatric this patient is able to make decisions and demonstrates good insight into disease process. Alert and Oriented x 3. pleasant and cooperative. Notes Patient's wound bed did not require any sharp debridement and actually appears to be quite healthy and I do not see any signs of worsening overall I think that she is managing nicely and moving in the right direction the compression wrap is doing a great job keeping her edema under control unfortunately the edema on the right leg is not doing nearly as well. She does not wear any compression socks we told her previously and given her information where she can order these however she does not seem to be putting much into try to get this actually done. Electronic Signature(s) Signed: 07/23/2021 2:13:00 PM By: Worthy Keeler PA-C Entered By: Worthy Keeler on 07/23/2021 14:12:59 [name] ([id number]) -------------------------------------------------------------------------------- Physician Orders Details Patient Name: [name] Date of Service: 07/23/2021 1:15 PM Medical Record Number: [id number] Patient Account Number: [id number] Date of Birth/Sex: Apr 15, 1946 (75 y.o. F) Treating RN: Carlene Coria Primary Care Provider: [name] Other Clinician: Referring Provider: [name] Treating Provider/Extender: Skipper Cliche in Treatment: 26 Verbal / Phone Orders: No Diagnosis Coding ICD-10 Coding Code Description I89.0 Lymphedema, not elsewhere classified I87.332 Chronic venous hypertension (idiopathic) with ulcer and inflammation of left lower extremity L97.828 Non-pressure chronic ulcer of other part of left lower leg with other specified severity Follow-up Appointments o Return Appointment in 1 week. Bathing/ Shower/ Hygiene o May shower with wound dressing protected with water  repellent cover or cast protector. Edema Control - Lymphedema / Segmental Compressive Device / Other Left Lower Extremity o Optional: One layer of unna paste to top of compression wrap (to act as an anchor). o Tubigrip single layer applied. - RIGHT LEG tubi grip applied "E" until you get your compression sock use this and it's hand washable o Patient to wear own compression stockings. Remove compression stockings every night before going to bed and put on every morning when getting up. - on RIGHT LEG call [employer] for compression sock 15-22 mm/Hg, measurements and phone number on blue sheet [employer] on [employer] in [place] also has the compression socks o Elevate, Exercise Daily and Avoid Standing for Long Periods of Time. o Elevate legs to the level of the heart and pump ankles as often as possible o Elevate leg(s) parallel to the floor when sitting. o Compression Pump: Use compression pump on left lower extremity for 60 minutes, twice daily. - You may pump over wraps o Compression Pump: Use compression pump on right lower extremity  for 60 minutes, twice daily. - You may pump over wraps Additional Orders / Instructions Wound #2 Left,Lateral Lower Leg o Follow Nutritious Diet and Increase Protein Intake - Increase protein intake. Wound Treatment Wound #2 - Lower Leg Wound Laterality: Left, Lateral Cleanser: Soap and Water 1 x Per Week/30 Days Discharge Instructions: Gently cleanse wound with antibacterial soap, rinse and pat dry prior to dressing wounds Primary Dressing: Hydrofera Blue Ready Transfer Foam, 2.5x2.5 (in/in) 1 x Per Week/30 Days Discharge Instructions: Apply Hydrofera Blue Ready to wound bed as directed Secondary Dressing: ABD Pad 5x9 (in/in) 1 x Per Week/30 Days Discharge Instructions: Cover with ABD pad Compression Wrap: Profore Lite LF 3 Multilayer Compression Bandaging System 1 x Per Week/30 Days Discharge Instructions: Apply 3  multi-layer wrap as prescribed. Electronic Signature(s) Signed: 07/23/2021 4:51:13 PM By: Worthy Keeler PA-C Signed: 07/26/2021 10:13:49 AM By: Carlene Coria RN Entered By: Carlene Coria on 07/23/2021 14:07:30 [name] ([id number]) -------------------------------------------------------------------------------- Problem List Details Patient Name: [name] Date of Service: 07/23/2021 1:15 PM Medical Record Number: [id number] Patient Account Number: [id number] Date of Birth/Sex: 12-Jun-1946 (75 y.o. F) Treating RN: Carlene Coria Primary Care Provider: [name] Other Clinician: Referring Provider: [name] Treating Provider/Extender: Skipper Cliche in Treatment: 26 Active Problems ICD-10 Encounter Code Description Active Date MDM Diagnosis I89.0 Lymphedema, not elsewhere classified 01/16/2021 No Yes I87.332 Chronic venous hypertension (idiopathic) with ulcer and inflammation of 01/16/2021 No Yes left lower extremity L97.828 Non-pressure chronic ulcer of other part of left lower leg with other 01/16/2021 No Yes specified severity Inactive Problems Resolved Problems Electronic Signature(s) Signed: 07/23/2021 1:15:34 PM By: Worthy Keeler PA-C Entered By: Worthy Keeler on 07/23/2021 13:15:34 [name] ([id number]) -------------------------------------------------------------------------------- Progress Note Details Patient Name: [name] Date of Service: 07/23/2021 1:15 PM Medical Record Number: [id number] Patient Account Number: [id number] Date of Birth/Sex: [birth year] (75 y.o. F) Treating RN: Carlene Coria Primary Care Provider: [name] Other Clinician: Referring Provider: [name] Treating Provider/Extender: Skipper Cliche in Treatment: 26 Subjective Chief Complaint Information obtained from Patient Left LE Ulcer History of Present Illness (HPI) 03/15/2020 upon evaluation today patient presents today for initial evaluation  here in our clinic concerning a wound that is on the left posterior lower extremity. Unfortunately this has been giving the patient some discomfort at this point which she notes has been affected her pretty much daily. The patient does have a history of venous insufficiency, hypertension, congestive heart failure, and a history of breast cancer. Fortunately there does not appear to be evidence of active infection at this time which is great news. No fevers, chills, nausea, vomiting, or diarrhea. Wound is not extremely large but does have some slough covering the surface of the wound. This is going require sharp debridement today. 03/15/2020 upon evaluation today patient appears to be doing fairly well in regard to her wound. She does have some slough noted on the surface of the wound currently but I do believe the Iodoflex has been beneficial over the past week. There is no signs of active infection at this time which is great news and overall very pleased with the progress. No fevers, chills, nausea, vomiting, or diarrhea. 04/02/2020 upon evaluation today patient appears to be doing a little better in regard to her wound size wise this is not tremendously smaller she does have some slough buildup on the surface of the wound. With that being said this is going require some sharp debridement today to clear away some of the necrotic  debris. Fortunately there is no evidence of active infection at this time. No fevers, chills, nausea, vomiting, or diarrhea. 04/10/2020 on evaluation today patient appears to be doing well with regard to her ulcer which is measuring somewhat smaller today. She actually has more granulation tissue noted which is also good news is no need for sharp debridement today. Fortunately there is no evidence of infection either which is also excellent. No fevers, chills, nausea, vomiting, or diarrhea. 04/26/2020 on evaluation today patient's wound actually is showing signs of good improvement  which is great news. There does not appear to be any evidence of active infection. I do believe she is tolerating the collagen at this point. 05/03/2020 upon evaluation today patient's wound actually showed signs of good granulation at this time there does not appear to be any evidence of active infection which is great news and overall very pleased with where things stand. With that being said I do believe that the patient is can require some sharp debridement today but fortunately nothing too significant. 05/11/20 on evaluation today patient appears to be doing well in regard to her leg ulcer. She's been tolerating the dressing changes without complication. Fortunately there is no signs of active infection at this time. Overall I'm very pleased with where things stand. 05/18/2020 upon evaluation today patient's wound is showing signs of improvement is very dry and I think the collagen for the most part just dried to the wound bed. I am going to clean this away with sharp debridement in order to get this under control in my opinion. 05/25/2020 upon evaluation today patient actually appears to be doing well in regard to her leg ulcer. In fact upon inspection it appears she could potentially be completely healed but that is not guaranteed based on what we are seeing. There does not appear to be any signs of active infection which is great news. 05/31/2020 on evaluation today patient appears to be doing well with regard to her wounds. She in fact appears to be almost completely healed on the left leg there is just a very small opening remaining point 1 06/08/2020 upon evaluation today patient appears to be doing excellent in regard to her leg ulcer on the left in fact it appears to be that she is completely healed as of today. I see no signs of active infection at this time which is great news. No fevers, chills, nausea, vomiting, or diarrhea. READMISSION 01/16/2021 Patient that we have had in this clinic  with a wound on the left posterior calf in the summer 2021 extending into October. She was felt to have chronic venous insufficiency. Per the patient's description she was discharged with what sounds like external compression stockings although she could not afford the "$75 per leg". She therefore did not get anything. Apparently her wound that she has currently is been in existence since January. She has been followed at vein and vascular with Unna boots and I think calcium alginate. She had ABIs done on 06/25/2020 that showed an noncompressible ABI on the right leg and the left leg but triphasic waveforms with good great toe pressures and a TBI of 0.90 on the right and 0.91 on the left Surprisingly the patient also had venous reflux studies that were really unremarkable. This included no evidence of DVTs or SVTs but there was no evidence of deep venous insufficiency or superficial venous insufficiency in the greater saphenous or short saphenous veins bilaterally. I would wonder about more central venous issues or  perhaps this is all lymphedema 01/25/2021 upon evaluation today patient appears to be doing decently well and guard to her wound. The good news is the Iodoflex that is job she saw Dr. Dellia Nims last week for readmission and to be honest I think that this has done extremely well over that week. I do believe that she can tolerate the sharp debridement today which will be good. I think that cleaning off the wound will likely allow [place] to be able to use a different type of dressing I do not think the Iodoflex will even be necessary based on how clean the wound looks. Fortunately there is no sign of active infection at this time which is great news. No fevers, chills, nausea, vomiting, or diarrhea. LARIYAH, SHETTERLY ([id number]) 02/12/2021 upon evaluation today patient appears to be doing well with regard to her leg ulcer. We did switch to alginate when she came for nurse visit I think is doing much  better for her. Fortunately there does not appear to be any signs of active infection at this time which is great news. No fevers, chills, nausea, vomiting, or diarrhea. 02/18/2021 upon evaluation today patient's wound is actually showing signs of good improvement. I am very pleased with where things stand currently. I do not see any signs of active infection which is great news and overall I feel like she is making good progress. The patient likewise is happy that things are doing so well and that this is measuring somewhat smaller. 02/25/2021 upon evaluation today patient actually appears to be doing quite well in regard to her wounds. She has been tolerating the dressing changes without complication. Fortunately there does not appear to be any signs of active infection which is great news and overall I am extremely pleased with where things stand. No fevers, chills, nausea, vomiting, or diarrhea. She does have a lot of edema I really do think she needs compression socks to be worn in order to prevent things from causing additional issues for her currently. Especially on the right leg she should be wearing compression socks all the time to be honest. 03/04/2021 upon evaluation today patient's leg ulcer actually appears to be doing pretty well which is great news. There does not appear to be any significant change but overall the appearance is better even though the size is not necessarily reflecting a great improvement. Fortunately there does not appear to be any signs of active infection. No fevers, chills, nausea, vomiting, or diarrhea. 03/12/2021 upon evaluation today patient appears to be doing well with regard to her wounds. She has been tolerating the dressing changes without complication. The good news is that the wound on her left lateral leg is doing much better and is showing signs of good improvement. Overall her swelling is controlled well as well. She does need some compression socks she  plans to go Jefferey socks next week. 03/18/2021 on evaluation today patient's wound actually is showing signs of good improvement. She does have a little bit of slough this can require some sharp debridement today. 03/25/2021 upon evaluation today patient appears to be doing well with regard to her wound on the left lateral leg. She has been tolerating the dressing changes without complication. Fortunately there does not appear to be any signs of active infection at this time. No fever chills no 04/01/2021 upon evaluation today patient's wound is showing signs of improvement and overall very pleased with where things stand at this point. There is no evidence of active infection  which is great news as well and in general I think that she is making great progress. 04/09/2021 upon evaluation today patient appears to be doing well with regard to her ankle ulcer. She is making good progress currently which is great news. There does not appear to be any signs of infection also excellent news. In general I am extremely pleased with where things stand today. No fevers, chills, nausea, vomiting, or diarrhea. 04/23/2021 upon inspection today patient appears to be doing quite well with regard to her leg ulcer. She has been tolerating the dressing changes without complication. She is can require some sharp debridement today but overall seems to be doing excellent. 05/06/2021 upon evaluation today patient unfortunately appears to be doing poorly overall in regard to her swelling. She is actually significantly more swollen than last week despite the fact that her wound is doing better this is not a good trend. I think that she probably needs to see her cardiologist ASAP and I discussed that with her today. This includes the fact that honestly I think she probably is getting need an increase in her diuretic or something to try to clear away some of the excess fluid that she is experiencing at the moment. She is not been  doing her pumps even twice a day but again right now more concerned that if she were to do the pump she would fluid overload her lungs causing other issues as far as her breathing is concerned. Obviously I think she needs to contact them and move up the appointment for the 12th of something sooner 2 weeks is a bit too long to wait with how swollen she has. 05/14/2021 upon evaluation today patient's wound actually showing signs of being a little bit smaller compared to previous. She has been making good progress however. Fortunately there does not appear to be any evidence of active infection which is great. Overall I am extremely pleased in that regard. Nonetheless I am still concerned that she is having quite a bit of issue as far as the swelling is concerned she has been placed on fluid restriction as well as an increase in the furosemide by her doctor. We will see how things progress. 05/21/2021 upon evaluation today patient appears to be doing well with regard to her wound in general. She has been tolerating the dressing changes without complication. With that being said she has been using silver cell for a while and while this was showing signs of improvement it is now gotten to the point where this has slowed down quite significantly. I do believe that switching to a different dressing may be beneficial for her today. 05/28/2021 upon evaluation today patient actually appears to be doing quite well in regard to her wound. In fact there is really not any need for sharp debridement today based on what I see. The Hydrofera Blue is doing great and overall I think that she is managing quite nicely with the compression wrapping. Her edema is still down quite a bit with the wrapping in place. 06/11/2021 upon evaluation today patient appears to be doing well with regard to her leg ulcer. She has been tolerating the dressing changes without complication. Fortunately there does not appear to be any signs of  active infection at this time which is great news. No fevers, chills, nausea, vomiting, or diarrhea. She is going require some sharp debridement today. 10/11; left leg ulcer much better looking and with improved surface area. She is using Hydrofera Blue under  3 layer compression. She does not have any stocking on the right leg which in itself has very significant nonpitting edema 06/25/2021 upon evaluation today patient appears to be doing well with regard to the wound on her left leg. She has been tolerating the dressing changes without complication and overall I am extremely pleased with where things stand today. I do not see any evidence of infection which is great news. 07/02/2021 upon evaluation today patient's wound actually showing signs of excellent improvement and actually very pleased with where we stand today and the overall appearance. Fortunately there does not appear to be any signs of active infection. No fevers, chills, nausea, vomiting, or diarrhea. 07/09/2021 upon evaluation today patient appears to be doing better in regard to her wound this is measuring smaller and she is headed in the appropriate direction based on what I am seeing currently. I do not see any evidence of active infection systemically which is great news. 07/16/2021 upon evaluation today patient appears to be doing pretty well currently in regard to her leg ulcer. This is measuring smaller and looking much better this is great news. Fortunately there does not appear to be any evidence of active infection systemically which is great news NAASIA, WEILBACHER ([id number]) as well. 07/23/2021 upon evaluation today patient's wound is actually showing signs of good improvement this is definitely measuring smaller. I do not see any signs of infection which is great news and overall very pleased in that regard. Overall I think that she is doing well with the [facility]. Objective Constitutional Obese and well-hydrated  in no acute distress. Vitals Time Taken: 1:49 PM, Height: 69 in, Weight: 244 lbs, BMI: 36, Temperature: 98.2 F, Pulse: 65 bpm, Respiratory Rate: 18 breaths/min, Blood Pressure: 163/77 mmHg. Respiratory normal breathing without difficulty. Cardiovascular 1+ dorsalis pedis/posterior tibialis pulses. 2+ pitting edema of the bilateral lower extremities. Psychiatric this patient is able to make decisions and demonstrates good insight into disease process. Alert and Oriented x 3. pleasant and cooperative. General Notes: Patient's wound bed did not require any sharp debridement and actually appears to be quite healthy and I do not see any signs of worsening overall I think that she is managing nicely and moving in the right direction the compression wrap is doing a great job keeping her edema under control unfortunately the edema on the right leg is not doing nearly as well. She does not wear any compression socks we told her previously and given her information where she can order these however she does not seem to be putting much into try to get this actually done. Integumentary (Hair, Skin) Wound #2 status is Open. Original cause of wound was Gradually Appeared. The date acquired was: 09/08/2020. The wound has been in treatment 26 weeks. The wound is located on the Left,Lateral Lower Leg. The wound measures 1.5cm length x 0.4cm width x 0.1cm depth; 0.471cm^2 area and 0.047cm^3 volume. There is Fat Layer (Subcutaneous Tissue) exposed. There is no tunneling or undermining noted. There is a medium amount of serosanguineous drainage noted. There is large (67-100%) red granulation within the wound bed. There is no necrotic tissue within the wound bed. 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 There was a  Three Layer Compression Therapy Procedure by Carlene Coria, RN. Post procedure Diagnosis Wound #2: Same as Pre-Procedure TYFFANY, WALDROP ([id number]) Plan Follow-up Appointments: Return Appointment in 1 week. Bathing/ Shower/ Hygiene: May shower with wound dressing protected with water repellent cover or cast protector. Edema Control - Lymphedema / Segmental Compressive Device / Other: Optional: One layer of unna paste to top of compression wrap (to act as an anchor). Tubigrip single layer applied. - RIGHT LEG tubi grip applied "E" until you get your compression sock use this and it's hand washable Patient to wear own compression stockings. Remove compression stockings every night before going to bed and put on every morning when getting up. - on RIGHT LEG call [facility] for compression sock 15-22 mm/Hg, measurements and phone number on blue sheet [employer] on [employer] in [place] also has the compression socks Elevate, Exercise Daily and Avoid Standing for Long Periods of Time. Elevate legs to the level of the heart and pump ankles as often as possible Elevate leg(s) parallel to the floor when sitting. Compression Pump: Use compression pump on left lower extremity for 60 minutes, twice daily. - You may pump over wraps Compression Pump: Use compression pump on right lower extremity for 60 minutes, twice daily. - You may pump over wraps Additional Orders / Instructions: Wound #2 Left,Lateral Lower Leg: Follow Nutritious Diet and Increase Protein Intake - Increase protein intake. WOUND #2: - Lower Leg Wound Laterality: Left, Lateral Cleanser: Soap and Water 1 x Per Week/30 Days Discharge Instructions: Gently cleanse wound with antibacterial soap, rinse and pat dry prior to dressing wounds Primary Dressing: Hydrofera Blue Ready  Transfer Foam, 2.5x2.5 (in/in) 1 x Per Week/30 Days Discharge Instructions: Apply Hydrofera Blue Ready to wound bed as directed Secondary Dressing: ABD Pad 5x9 (in/in) 1 x Per Week/30 Days Discharge Instructions: Cover with ABD pad Compression Wrap: Profore Lite LF 3 Multilayer Compression Bandaging System 1 x Per Week/30 Days Discharge Instructions: Apply 3 multi-layer wrap as prescribed. 1. I would recommend that we go ahead and continue with the wound care measures as before and the patient is in agreement with the plan. This includes the use of the [facility] which I think is doing a great job at this point her wound is doing quite nicely. 2. I am also can recommend that we continue with a 3 layer compression wrap which is keeping her edema under good control. We will see patient back for reevaluation in 1 week here in the clinic. If anything worsens or changes patient will contact our office for additional recommendations. Electronic Signature(s) Signed: 07/23/2021 2:13:23 PM By: Worthy Keeler PA-C Entered By: Worthy Keeler on 07/23/2021 14:13:23 [name] ([id number]) -------------------------------------------------------------------------------- SuperBill Details Patient Name: [name] Date of Service: 07/23/2021 Medical Record Number: [id number] Patient Account Number: [id number] Date of Birth/Sex: April 02, 1946 (75 y.o. F) Treating RN: Carlene Coria Primary Care Provider: [name] Other Clinician: Referring Provider: [name] Treating Provider/Extender: Skipper Cliche in Treatment: 26 Diagnosis Coding ICD-10 Codes Code Description I89.0 Lymphedema, not elsewhere classified I87.332 Chronic venous hypertension (idiopathic) with ulcer and inflammation of left lower extremity L97.828 Non-pressure chronic ulcer of other part of left lower leg with other specified severity Facility Procedures CPT4 Code: [id number] Description: (Facility Use Only)  [id number] - APPLY MULTLAY COMPRS LWR LT LEG Modifier: Quantity: 1 Physician Procedures CPT4 Code Description: 4401027 [id number] - WC PHYS LEVEL 3 - EST PT Modifier: Quantity: 1 CPT4 Code Description: ICD-10 Diagnosis Description I89.0 Lymphedema, not  elsewhere classified I87.332 Chronic venous hypertension (idiopathic) with ulcer and inflammation of le L97.828 Non-pressure chronic ulcer of other part of left lower leg with  other spec Modifier: ft lower extremity ified severity Quantity: Electronic Signature(s) Signed: 07/23/2021 2:13:45 PM By: Worthy Keeler PA-C Entered By: Worthy Keeler on 07/23/2021 14:13:45

## 2021-07-23 NOTE — Progress Notes (Addendum)
ILANNA, DEIHL ([id number]) Visit Report for 07/23/2021 Arrival Information Details Patient Name: [name], [name] Date of Service: 07/23/2021 1:15 PM Medical Record Number: [id number] Patient Account Number: [id number] Date of Birth/Sex: 1945/11/01 (75 y.o. F) Treating RN: Carlene Coria Primary Care Ruby Logiudice: [name] Other Clinician: Referring Lerry Cordrey: [name] Treating Jsaon Yoo/Extender: Skipper Cliche in Treatment: 26 Visit Information History Since Last Visit All ordered tests and consults were completed: No Patient Arrived: Wheel Chair Added or deleted any medications: No Arrival Time: 13:41 Any new allergies or adverse reactions: No Accompanied By: self Had a fall or experienced change in No Transfer Assistance: None activities of daily living that may affect Patient Identification Verified: Yes risk of falls: Secondary Verification Process Completed: Yes Signs or symptoms of abuse/neglect since last visito No Patient Requires Transmission-Based Precautions: No Hospitalized since last visit: No Patient Has Alerts: Yes Implantable device outside of the clinic excluding No Patient Alerts: NOT DIABETIC cellular tissue based products placed in the center since last visit: Has Dressing in Place as Prescribed: Yes Has Compression in Place as Prescribed: Yes Pain Present Now: No Electronic Signature(s) Signed: 07/26/2021 10:13:49 AM By: Carlene Coria RN Entered By: Carlene Coria on 07/23/2021 13:49:05 [name] ([id number]) -------------------------------------------------------------------------------- Clinic Level of Care Assessment Details Patient Name: [name] Date of Service: 07/23/2021 1:15 PM Medical Record Number: [id number] Patient Account Number: [id number] Date of Birth/Sex: August 24, 1946 (75 y.o. F) Treating RN: Carlene Coria Primary Care Mykel Sponaugle: [name] Other Clinician: Referring Vylet Maffia: [name] Treating  Wilbur Labuda/Extender: Skipper Cliche in Treatment: 26 Clinic Level of Care Assessment Items TOOL 1 Quantity Score _0  - Use when EandM and Procedure is performed on INITIAL visit 0 ASSESSMENTS - Nursing Assessment / Reassessment _1  - General Physical Exam (combine w/ comprehensive assessment (listed just below) when performed on new 0 pt. evals) _2  - 0 Comprehensive Assessment (HX, ROS, Risk Assessments, Wounds Hx, etc.) ASSESSMENTS - Wound and Skin Assessment / Reassessment _3  - Dermatologic / Skin Assessment (not related to wound area) 0 ASSESSMENTS - Ostomy and/or Continence Assessment and Care _4  - Incontinence Assessment and Management 0 _5  - 0 Ostomy Care Assessment and Management (repouching, etc.) PROCESS - Coordination of Care _6  - Simple Patient / Family Education for ongoing care 0 _7  - 0 Complex (extensive) Patient / Family Education for ongoing care _8  - 0 Staff obtains Programmer, systems, Records, Test Results / Process Orders _9  - 0 Staff telephones HHA, Nursing Homes / Clarify orders / etc _10  - 0 Routine Transfer to another Facility (non-emergent condition) _11  - 0 Routine Hospital Admission (non-emergent condition) _12  - 0 New Admissions / Biomedical engineer / Ordering NPWT, Apligraf, etc. _13  - 0 Emergency Hospital Admission (emergent condition) PROCESS - Special Needs _14  - Pediatric / Minor Patient Management 0 _15  - 0 Isolation Patient Management _16  - 0 Hearing / Language / Visual special needs _17  - 0 Assessment of Community assistance (transportation, D/C planning, etc.) _18  - 0 Additional assistance / Altered mentation _19  - 0 Support Surface(s) Assessment (bed, cushion, seat, etc.) INTERVENTIONS - Miscellaneous _20  - External ear exam 0 _21  - 0 Patient Transfer (multiple staff / Civil Service fast streamer / Similar devices) _22  - 0 Simple Staple / Suture removal (25 or less) _23  - 0 Complex Staple / Suture removal (26 or more) _24  - 0 Hypo/Hyperglycemic Management (do not  check if billed separately) _25  - 0 Ankle / Brachial Index (ABI) - do not check if billed separately Has the patient been seen at the hospital within the  last three years: Yes Total Score: 0 Level Of Care: ____ [name] ([id number]) Electronic Signature(s) Signed: 07/26/2021 10:13:49 AM By: Carlene Coria RN Entered By: Carlene Coria on 07/23/2021 14:09:51 [name] ([id number]) -------------------------------------------------------------------------------- Compression Therapy Details Patient Name: [name] Date of Service: 07/23/2021 1:15 PM Medical Record Number: [id number] Patient Account Number: [id number] Date of Birth/Sex: 1945-10-09 (75 y.o. F) Treating RN: Carlene Coria Primary Care Jewelle Whitner: [name] Other Clinician: Referring Shakena Callari: [name] Treating [place] Merida/Extender: Skipper Cliche in Treatment: 26 Compression Therapy Performed for Wound Assessment: Wound #2 Left,Lateral Lower Leg Performed By: Clinician Carlene Coria, RN Compression Type: Three Layer Post Procedure Diagnosis Same as Pre-procedure Electronic Signature(s) Signed: 07/26/2021 10:13:49 AM By: Carlene Coria RN Entered By: Carlene Coria on 07/23/2021 14:02:51 [name] ([id number]) -------------------------------------------------------------------------------- Encounter Discharge Information Details Patient Name: [name] Date of Service: 07/23/2021 1:15 PM Medical Record Number: [id number] Patient Account Number: [id number] Date of Birth/Sex: May 25, 1946 (75 y.o. F) Treating RN: Carlene Coria Primary Care Alyssandra Hulsebus: [name] Other Clinician: Referring Nicodemus Denk: [name] Treating Kyriana Yankee/Extender: Skipper Cliche in Treatment: 26 Encounter Discharge Information Items Discharge Condition: Stable Ambulatory Status: Wheelchair Discharge Destination: Home Transportation: Private Auto Accompanied By: self Schedule Follow-up Appointment: Yes Clinical  Summary of Care: Patient Declined Electronic Signature(s) Signed: 07/26/2021 10:13:49 AM By: Carlene Coria RN Entered By: Carlene Coria on 07/23/2021 14:12:30 [name] ([id number]) -------------------------------------------------------------------------------- Lower Extremity Assessment Details Patient Name: [name] Date of Service: 07/23/2021 1:15 PM Medical Record Number: [id number] Patient Account Number: [id number] Date of Birth/Sex: 03-28-1946 (75 y.o. F) Treating RN: Carlene Coria Primary Care [name]: [name] Other Clinician: Referring Maccoy Haubner: [name] Treating Dallys Nowakowski/Extender: Skipper Cliche in Treatment: 26 Edema Assessment Assessed: [Left: No] [Right: No] Edema: [Left: Ye] [Right: s] Calf Left: Right: Point of Measurement: 27 cm From Medial Instep 37 cm Ankle Left: Right: Point of Measurement: 10 cm From Medial Instep 27 cm Vascular Assessment Pulses: Dorsalis Pedis Palpable: [Left:Yes] Electronic Signature(s) Signed: 07/26/2021 10:13:49 AM By: Carlene Coria RN Entered By: Carlene Coria on 07/23/2021 13:57:27 [name] ([id number]) -------------------------------------------------------------------------------- Multi Wound Chart Details Patient Name: [name] Date of Service: 07/23/2021 1:15 PM Medical Record Number: [id number] Patient Account Number: [id number] Date of Birth/Sex: [birth year] (75 y.o. F) Treating RN: Carlene Coria Primary Care Gavrielle Streck: [name] Other Clinician: Referring Clara Herbison: [name] Treating Sacora Hawbaker/Extender: Skipper Cliche in Treatment: 26 Vital Signs Height(in): 69 Pulse(bpm): 65 Weight(lbs): 244 Blood Pressure(mmHg): 163/77 Body Mass Index(BMI): 36 Temperature(F): 98.2 Respiratory Rate(breaths/min): 18 Photos: [N/A:N/A] Wound Location: Left, Lateral Lower Leg N/A N/A Wounding Event: Gradually Appeared N/A N/A Primary Etiology: Venous Leg Ulcer N/A N/A Comorbid History:  Cataracts, Lymphedema, Sleep N/A N/A Apnea, Congestive Heart Failure, Hypertension, Osteoarthritis, Neuropathy, Received Chemotherapy, Received Radiation Date Acquired: 09/08/2020 N/A N/A Weeks of Treatment: 26 N/A N/A Wound Status: Open N/A N/A Measurements L x W x D (cm) 1.5x0.4x0.1 N/A N/A Area (cm) : 0.471 N/A N/A Volume (cm) : 0.047 N/A N/A % Reduction in Area: 90.00% N/A N/A % Reduction in Volume: 90.00% N/A N/A Classification: Full Thickness Without Exposed N/A N/A Support Structures Exudate Amount: Medium N/A N/A Exudate Type: Serosanguineous N/A N/A Exudate Color: red, brown N/A N/A Granulation Amount: Large (67-100%) N/A N/A Granulation Quality: Red N/A N/A Necrotic Amount: None Present (0%) N/A N/A Exposed Structures: Fat Layer (Subcutaneous Tissue): N/A N/A Yes Fascia: No Tendon: No Muscle: No Joint: No Bone: No Epithelialization: Small (1-33%) N/A N/A Treatment Notes Electronic Signature(s) Signed: 07/26/2021 10:13:49 AM By: Carlene Coria RN  Entered By: Carlene Coria on 07/23/2021 14:01:53 [name] ([id number]) -------------------------------------------------------------------------------- Multi-Disciplinary Care Plan Details Patient Name: [name], [name] Date of Service: 07/23/2021 1:15 PM Medical Record Number: [id number] Patient Account Number: [id number] Date of Birth/Sex: [birth year] (75 y.o. F) Treating RN: Carlene Coria Primary Care Daveda Larock: [name] Other Clinician: Referring Lema Heinkel: [name] Treating Ashaunti Treptow/Extender: Skipper Cliche in Treatment: 26 Active Inactive Wound/Skin Impairment Nursing Diagnoses: Impaired tissue integrity Goals: Patient/caregiver will verbalize understanding of skin care regimen Date Initiated: 01/16/2021 Date Inactivated: 02/18/2021 Target Resolution Date: 02/16/2021 Goal Status: Met Ulcer/skin breakdown will have a volume reduction of 30% by week 4 Date Initiated: 01/16/2021 Date Inactivated:  02/18/2021 Target Resolution Date: 02/16/2021 Goal Status: Met Ulcer/skin breakdown will have a volume reduction of 50% by week 8 Date Initiated: 01/16/2021 Date Inactivated: 04/01/2021 Target Resolution Date: 03/18/2021 Goal Status: Unmet Unmet Reason: comorbities Ulcer/skin breakdown will have a volume reduction of 80% by week 12 Date Initiated: 04/01/2021 Date Inactivated: 04/23/2021 Target Resolution Date: 04/18/2021 Goal Status: Unmet Unmet Reason: comorbities Ulcer/skin breakdown will heal within 14 weeks Date Initiated: 04/01/2021 Target Resolution Date: 05/19/2021 Goal Status: Active Interventions: Assess ulceration(s) every visit Treatment Activities: Topical wound management initiated : 01/16/2021 Notes: Electronic Signature(s) Signed: 07/26/2021 10:13:49 AM By: Carlene Coria RN Entered By: Carlene Coria on 07/23/2021 14:01:13 [name] ([id number]) -------------------------------------------------------------------------------- Pain Assessment Details Patient Name: [name] Date of Service: 07/23/2021 1:15 PM Medical Record Number: [id number] Patient Account Number: [id number] Date of Birth/Sex: 1945/11/24 (75 y.o. F) Treating RN: Carlene Coria Primary Care Marlene Pfluger: [name] Other Clinician: Referring Harolyn Cocker: [name] Treating Yuliet Needs/Extender: Skipper Cliche in Treatment: 26 Active Problems Location of Pain Severity and Description of Pain Patient Has Paino No Site Locations Pain Management and Medication Current Pain Management: Electronic Signature(s) Signed: 07/26/2021 10:13:49 AM By: Carlene Coria RN Entered By: Carlene Coria on 07/23/2021 13:49:54 [name] ([id number]) -------------------------------------------------------------------------------- Patient/Caregiver Education Details Patient Name: [name] Date of Service: 07/23/2021 1:15 PM Medical Record Number: [id number] Patient Account Number: [id number] Date of  Birth/Gender: Jan 20, 1946 (75 y.o. F) Treating RN: Carlene Coria Primary Care Physician: [name] Other Clinician: Referring Physician: [name] Treating Physician/Extender: Skipper Cliche in Treatment: 60 Education Assessment Education Provided To: Patient Education Topics Provided Wound/Skin Impairment: Methods: Explain/Verbal Responses: State content correctly Electronic Signature(s) Signed: 07/26/2021 10:13:49 AM By: Carlene Coria RN Entered By: Carlene Coria on 07/23/2021 14:10:23 [name] ([id number]) -------------------------------------------------------------------------------- Wound Assessment Details Patient Name: [name] Date of Service: 07/23/2021 1:15 PM Medical Record Number: [id number] Patient Account Number: [id number] Date of Birth/Sex: 1946-03-25 (75 y.o. F) Treating RN: Carlene Coria Primary Care Burnice Oestreicher: [name] Other Clinician: Referring Jacklyn Branan: [name] Treating Naraya Stoneberg/Extender: Skipper Cliche in Treatment: 26 Wound Status Wound Number: 2 Primary Venous Leg Ulcer Etiology: Wound Location: Left, Lateral Lower Leg Wound Open Wounding Event: Gradually Appeared Status: Date Acquired: 09/08/2020 Comorbid Cataracts, Lymphedema, Sleep Apnea, Congestive Heart Weeks Of Treatment: 26 History: Failure, Hypertension, Osteoarthritis, Neuropathy, Clustered Wound: No Received Chemotherapy, Received Radiation Photos Wound Measurements Length: (cm) 1.5 Width: (cm) 0.4 Depth: (cm) 0.1 Area: (cm) 0.471 Volume: (cm) 0.047 % Reduction in Area: 90% % Reduction in Volume: 90% Epithelialization: Small (1-33%) Tunneling: No Undermining: No Wound Description Classification: Full Thickness Without Exposed Support Structu Exudate Amount: Medium Exudate Type: Serosanguineous Exudate Color: red, brown res Foul Odor After Cleansing: No Slough/Fibrino No Wound Bed Granulation Amount: Large (67-100%) Exposed  Structure Granulation Quality: Red Fascia Exposed: No Necrotic Amount: None Present (0%) Fat Layer (Subcutaneous Tissue) Exposed:  Yes Tendon Exposed: No Muscle Exposed: No Joint Exposed: No Bone Exposed: No Treatment Notes Wound #2 (Lower Leg) Wound Laterality: Left, Lateral Cleanser Soap and Water Discharge Instruction: Gently cleanse wound with antibacterial soap, rinse and pat dry prior to dressing wounds Peri-Wound Care EVELENA, MASCI ([id number]) Topical Primary Dressing Hydrofera Blue Ready Transfer Foam, 2.5x2.5 (in/in) Discharge Instruction: Apply Hydrofera Blue Ready to wound bed as directed Secondary Dressing ABD Pad 5x9 (in/in) Discharge Instruction: Cover with ABD pad Secured With Compression Wrap Profore Lite LF 3 Multilayer Compression Reynolds Discharge Instruction: Apply 3 multi-layer wrap as prescribed. Compression Stockings Add-Ons Electronic Signature(s) Signed: 07/26/2021 10:13:49 AM By: Carlene Coria RN Entered By: Carlene Coria on 07/23/2021 13:56:35 [name] ([id number]) -------------------------------------------------------------------------------- Vitals Details Patient Name: [name] Date of Service: 07/23/2021 1:15 PM Medical Record Number: [id number] Patient Account Number: [id number] Date of Birth/Sex: 1946-08-19 (75 y.o. F) Treating RN: Carlene Coria Primary Care Dorcus Riga: [name] Other Clinician: Referring Annise Boran: [name] Treating Ashaad Gaertner/Extender: Skipper Cliche in Treatment: 26 Vital Signs Time Taken: 13:49 Temperature (F): 98.2 Height (in): 69 Pulse (bpm): 65 Weight (lbs): 244 Respiratory Rate (breaths/min): 18 Body Mass Index (BMI): 36 Blood Pressure (mmHg): 163/77 Reference Range: 80 - 120 mg / dl Electronic Signature(s) Signed: 07/26/2021 10:13:49 AM By: Carlene Coria RN Entered By: Carlene Coria on 07/23/2021 13:49:39

## 2021-07-30 ENCOUNTER — Encounter: Payer: Medicare Other | Admitting: Physician Assistant

## 2021-07-30 ENCOUNTER — Other Ambulatory Visit: Payer: Self-pay

## 2021-07-30 DIAGNOSIS — I890 Lymphedema, not elsewhere classified: Secondary | ICD-10-CM | POA: Diagnosis not present

## 2021-07-30 NOTE — Progress Notes (Addendum)
[name], [name] ([id number]) Visit Report for 07/30/2021 Chief Complaint Document Details Patient Name: [name], [name] Date of Service: 07/30/2021 1:15 PM Medical Record Number: [id number] Patient Account Number: [id number] Date of Birth/Sex: 12-10-1945 (75 y.o. F) Treating RN: Carlene Coria Primary Care Provider: [name] Other Clinician: Referring Provider: [name] Treating Provider/Extender: Skipper Cliche in Treatment: 27 Information Obtained from: Patient Chief Complaint Left LE Ulcer Electronic Signature(s) Signed: 07/30/2021 1:41:12 PM By: Worthy Keeler PA-C Entered By: Worthy Keeler on 07/30/2021 13:41:12 [name] ([id number]) -------------------------------------------------------------------------------- Debridement Details Patient Name: [name] Date of Service: 07/30/2021 1:15 PM Medical Record Number: [id number] Patient Account Number: [id number] Date of Birth/Sex: September 02, 1946 (75 y.o. F) Treating RN: Carlene Coria Primary Care Provider: [name] Other Clinician: Referring Provider: [name] Treating Provider/Extender: Skipper Cliche in Treatment: 27 Debridement Performed for Wound #2 Left,Lateral Lower Leg Assessment: Performed By: Physician Tommie Sams., PA-C Debridement Type: Debridement Severity of Tissue Pre Debridement: Fat layer exposed Level of Consciousness (Pre- Awake and Alert procedure): Pre-procedure Verification/Time Out Yes - 13:55 Taken: Start Time: 13:55 Total Area Debrided (L x W): 0.6 (cm) x 0.4 (cm) = 0.24 (cm) Tissue and other material Viable, Non-Viable, Slough, Subcutaneous, Skin: Dermis , Skin: Epidermis, Slough debrided: Level: Skin/Subcutaneous Tissue Debridement Description: Excisional Instrument: Curette Bleeding: Minimum Hemostasis Achieved: Pressure End Time: 14:03 Procedural Pain: 0 Post Procedural Pain: 0 Response to Treatment: Procedure was tolerated well Level of  Consciousness (Post- Awake and Alert procedure): Post Debridement Measurements of Total Wound Length: (cm) 0.6 Width: (cm) 0.4 Depth: (cm) 0.1 Volume: (cm) 0.019 Character of Wound/Ulcer Post Debridement: Improved Severity of Tissue Post Debridement: Fat layer exposed Post Procedure Diagnosis Same as Pre-procedure Electronic Signature(s) Signed: 07/30/2021 5:02:50 PM By: Worthy Keeler PA-C Signed: 08/14/2021 1:44:34 PM By: Carlene Coria RN Entered By: Carlene Coria on 07/30/2021 14:02:22 [name] ([id number]) -------------------------------------------------------------------------------- HPI Details Patient Name: [name] Date of Service: 07/30/2021 1:15 PM Medical Record Number: [id number] Patient Account Number: [id number] Date of Birth/Sex: 07/12/1946 (75 y.o. F) Treating RN: Carlene Coria Primary Care Provider: [name] Other Clinician: Referring Provider: [name] Treating Provider/Extender: Skipper Cliche in Treatment: 27 History of Present Illness HPI Description: 03/15/2020 upon evaluation today patient presents today for initial evaluation here in our clinic concerning a wound that is on the left posterior lower extremity. Unfortunately this has been giving the patient some discomfort at this point which she notes has been affected her pretty much daily. The patient does have a history of venous insufficiency, hypertension, congestive heart failure, and a history of breast cancer. Fortunately there does not appear to be evidence of active infection at this time which is great news. No fevers, chills, nausea, vomiting, or diarrhea. Wound is not extremely large but does have some slough covering the surface of the wound. This is going require sharp debridement today. 03/15/2020 upon evaluation today patient appears to be doing fairly well in regard to her wound. She does have some slough noted on the surface of the wound currently but I do believe the  Iodoflex has been beneficial over the past week. There is no signs of active infection at this time which is great news and overall very pleased with the progress. No fevers, chills, nausea, vomiting, or diarrhea. 04/02/2020 upon evaluation today patient appears to be doing a little better in regard to her wound size wise this is not tremendously smaller she does have some slough buildup on the surface of the wound. With  that being said this is going require some sharp debridement today to clear away some of the necrotic debris. Fortunately there is no evidence of active infection at this time. No fevers, chills, nausea, vomiting, or diarrhea. 04/10/2020 on evaluation today patient appears to be doing well with regard to her ulcer which is measuring somewhat smaller today. She actually has more granulation tissue noted which is also good news is no need for sharp debridement today. Fortunately there is no evidence of infection either which is also excellent. No fevers, chills, nausea, vomiting, or diarrhea. 04/26/2020 on evaluation today patient's wound actually is showing signs of good improvement which is great news. There does not appear to be any evidence of active infection. I do believe she is tolerating the collagen at this point. 05/03/2020 upon evaluation today patient's wound actually showed signs of good granulation at this time there does not appear to be any evidence of active infection which is great news and overall very pleased with where things stand. With that being said I do believe that the patient is can require some sharp debridement today but fortunately nothing too significant. 05/11/20 on evaluation today patient appears to be doing well in regard to her leg ulcer. She's been tolerating the dressing changes without complication. Fortunately there is no signs of active infection at this time. Overall I'm very pleased with where things stand. 05/18/2020 upon evaluation today patient's  wound is showing signs of improvement is very dry and I think the collagen for the most part just dried to the wound bed. I am going to clean this away with sharp debridement in order to get this under control in my opinion. 05/25/2020 upon evaluation today patient actually appears to be doing well in regard to her leg ulcer. In fact upon inspection it appears she could potentially be completely healed but that is not guaranteed based on what we are seeing. There does not appear to be any signs of active infection which is great news. 05/31/2020 on evaluation today patient appears to be doing well with regard to her wounds. She in fact appears to be almost completely healed on the left leg there is just a very small opening remaining point 1 06/08/2020 upon evaluation today patient appears to be doing excellent in regard to her leg ulcer on the left in fact it appears to be that she is completely healed as of today. I see no signs of active infection at this time which is great news. No fevers, chills, nausea, vomiting, or diarrhea. READMISSION 01/16/2021 Patient that we have had in this clinic with a wound on the left posterior calf in the summer 2021 extending into October. She was felt to have chronic venous insufficiency. Per the patient's description she was discharged with what sounds like external compression stockings although she could not afford the "$75 per leg". She therefore did not get anything. Apparently her wound that she has currently is been in existence since January. She has been followed at vein and vascular with Unna boots and I think calcium alginate. She had ABIs done on 06/25/2020 that showed an noncompressible ABI on the right leg and the left leg but triphasic waveforms with good great toe pressures and a TBI of 0.90 on the right and 0.91 on the left Surprisingly the patient also had venous reflux studies that were really unremarkable. This included no evidence of DVTs or  SVTs but there was no evidence of deep venous insufficiency or superficial venous insufficiency  in the greater saphenous or short saphenous veins bilaterally. I would wonder about more central venous issues or perhaps this is all lymphedema 01/25/2021 upon evaluation today patient appears to be doing decently well and guard to her wound. The good news is the Iodoflex that is job she saw Dr. Dellia Nims last week for readmission and to be honest I think that this has done extremely well over that week. I do believe that she can tolerate the sharp debridement today which will be good. I think that cleaning off the wound will likely allow [place] to be able to use a different type of dressing I do not think the Iodoflex will even be necessary based on how clean the wound looks. Fortunately there is no sign of active infection at this time which is great news. No fevers, chills, nausea, vomiting, or diarrhea. 02/12/2021 upon evaluation today patient appears to be doing well with regard to her leg ulcer. We did switch to alginate when she came for nurse visit I think is doing much better for her. Fortunately there does not appear to be any signs of active infection at this time which is great news. No fevers, chills, nausea, vomiting, or diarrhea. ANTIA, RAHAL ([id number]) 02/18/2021 upon evaluation today patient's wound is actually showing signs of good improvement. I am very pleased with where things stand currently. I do not see any signs of active infection which is great news and overall I feel like she is making good progress. The patient likewise is happy that things are doing so well and that this is measuring somewhat smaller. 02/25/2021 upon evaluation today patient actually appears to be doing quite well in regard to her wounds. She has been tolerating the dressing changes without complication. Fortunately there does not appear to be any signs of active infection which is great news and overall I  am extremely pleased with where things stand. No fevers, chills, nausea, vomiting, or diarrhea. She does have a lot of edema I really do think she needs compression socks to be worn in order to prevent things from causing additional issues for her currently. Especially on the right leg she should be wearing compression socks all the time to be honest. 03/04/2021 upon evaluation today patient's leg ulcer actually appears to be doing pretty well which is great news. There does not appear to be any significant change but overall the appearance is better even though the size is not necessarily reflecting a great improvement. Fortunately there does not appear to be any signs of active infection. No fevers, chills, nausea, vomiting, or diarrhea. 03/12/2021 upon evaluation today patient appears to be doing well with regard to her wounds. She has been tolerating the dressing changes without complication. The good news is that the wound on her left lateral leg is doing much better and is showing signs of good improvement. Overall her swelling is controlled well as well. She does need some compression socks she plans to go Jefferey socks next week. 03/18/2021 on evaluation today patient's wound actually is showing signs of good improvement. She does have a little bit of slough this can require some sharp debridement today. 03/25/2021 upon evaluation today patient appears to be doing well with regard to her wound on the left lateral leg. She has been tolerating the dressing changes without complication. Fortunately there does not appear to be any signs of active infection at this time. No fever chills no 04/01/2021 upon evaluation today patient's wound is showing signs of improvement and  overall very pleased with where things stand at this point. There is no evidence of active infection which is great news as well and in general I think that she is making great progress. 04/09/2021 upon evaluation today patient appears  to be doing well with regard to her ankle ulcer. She is making good progress currently which is great news. There does not appear to be any signs of infection also excellent news. In general I am extremely pleased with where things stand today. No fevers, chills, nausea, vomiting, or diarrhea. 04/23/2021 upon inspection today patient appears to be doing quite well with regard to her leg ulcer. She has been tolerating the dressing changes without complication. She is can require some sharp debridement today but overall seems to be doing excellent. 05/06/2021 upon evaluation today patient unfortunately appears to be doing poorly overall in regard to her swelling. She is actually significantly more swollen than last week despite the fact that her wound is doing better this is not a good trend. I think that she probably needs to see her cardiologist ASAP and I discussed that with her today. This includes the fact that honestly I think she probably is getting need an increase in her diuretic or something to try to clear away some of the excess fluid that she is experiencing at the moment. She is not been doing her pumps even twice a day but again right now more concerned that if she were to do the pump she would fluid overload her lungs causing other issues as far as her breathing is concerned. Obviously I think she needs to contact them and move up the appointment for the 12th of something sooner 2 weeks is a bit too long to wait with how swollen she has. 05/14/2021 upon evaluation today patient's wound actually showing signs of being a little bit smaller compared to previous. She has been making good progress however. Fortunately there does not appear to be any evidence of active infection which is great. Overall I am extremely pleased in that regard. Nonetheless I am still concerned that she is having quite a bit of issue as far as the swelling is concerned she has been placed on fluid restriction as well  as an increase in the furosemide by her doctor. We will see how things progress. 05/21/2021 upon evaluation today patient appears to be doing well with regard to her wound in general. She has been tolerating the dressing changes without complication. With that being said she has been using silver cell for a while and while this was showing signs of improvement it is now gotten to the point where this has slowed down quite significantly. I do believe that switching to a different dressing may be beneficial for her today. 05/28/2021 upon evaluation today patient actually appears to be doing quite well in regard to her wound. In fact there is really not any need for sharp debridement today based on what I see. The Hydrofera Blue is doing great and overall I think that she is managing quite nicely with the compression wrapping. Her edema is still down quite a bit with the wrapping in place. 06/11/2021 upon evaluation today patient appears to be doing well with regard to her leg ulcer. She has been tolerating the dressing changes without complication. Fortunately there does not appear to be any signs of active infection at this time which is great news. No fevers, chills, nausea, vomiting, or diarrhea. She is going require some sharp debridement today. 10/11;  left leg ulcer much better looking and with improved surface area. She is using Hydrofera Blue under 3 layer compression. She does not have any stocking on the right leg which in itself has very significant nonpitting edema 06/25/2021 upon evaluation today patient appears to be doing well with regard to the wound on her left leg. She has been tolerating the dressing changes without complication and overall I am extremely pleased with where things stand today. I do not see any evidence of infection which is great news. 07/02/2021 upon evaluation today patient's wound actually showing signs of excellent improvement and actually very pleased with where  we stand today and the overall appearance. Fortunately there does not appear to be any signs of active infection. No fevers, chills, nausea, vomiting, or diarrhea. 07/09/2021 upon evaluation today patient appears to be doing better in regard to her wound this is measuring smaller and she is headed in the appropriate direction based on what I am seeing currently. I do not see any evidence of active infection systemically which is great news. 07/16/2021 upon evaluation today patient appears to be doing pretty well currently in regard to her leg ulcer. This is measuring smaller and looking much better this is great news. Fortunately there does not appear to be any evidence of active infection systemically which is great news as well. 07/23/2021 upon evaluation today patient's wound is actually showing signs of good improvement this is definitely measuring smaller. I do not see any signs of infection which is great news and overall very pleased in that regard. Overall I think that she is doing well with the Kamelia Lampkins, [place] ([id number]) [place]. 07/30/2020 upon evaluation today patient appears to be doing well in regard to her wound. She has been tolerating the dressing changes without complication think the Hydrofera Blue at this point is actually causing her to dry out a lot as far as the wound bed is concerned. Subsequently I think that we need to try to see what we can do to improve this. Fortunately there does not appear to be any evidence of active infection at this time which is great news. Electronic Signature(s) Signed: 07/30/2021 4:48:28 PM By: Worthy Keeler PA-C Entered By: Worthy Keeler on 07/30/2021 16:48:28 ASTELLA, DESIR ([id number]) -------------------------------------------------------------------------------- Physical Exam Details Patient Name: [name] Date of Service: 07/30/2021 1:15 PM Medical Record Number: [id number] Patient Account Number:  [id number] Date of Birth/Sex: [birth year] (75 y.o. F) Treating RN: Carlene Coria Primary Care Provider: [name] Other Clinician: Referring Provider: [name] Treating Provider/Extender: Skipper Cliche in Treatment: 4 Constitutional Well-nourished and well-hydrated in no acute distress. Respiratory normal breathing without difficulty. Psychiatric this patient is able to make decisions and demonstrates good insight into disease process. Alert and Oriented x 3. pleasant and cooperative. Notes Upon inspection patient's wound bed showed signs of good granulation and epithelization at this point. Overall I am extremely happy with the fact that the wound is getting somewhat smaller although I think we need to try something all different I would recommend that we use collagen at this point and discontinue the [facility]. Electronic Signature(s) Signed: 07/30/2021 4:48:49 PM By: Worthy Keeler PA-C Entered By: Worthy Keeler on 07/30/2021 16:48:48 ASHEY, TRAMONTANA ([id number]) -------------------------------------------------------------------------------- Physician Orders Details Patient Name: [name] Date of Service: 07/30/2021 1:15 PM Medical Record Number: [id number] Patient Account Number: [id number] Date of Birth/Sex: Oct 13, 1945 (75 y.o. 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 Bathing/ Shower/ Hygiene o May shower with wound dressing protected with  water repellent cover or cast protector. Edema Control - Lymphedema / Segmental Compressive Device / Other Left Lower Extremity o Optional: One layer of unna paste to top of compression wrap (to act as an anchor). o Tubigrip single layer applied. - RIGHT LEG tubi grip applied "E" until you get your compression sock use this and it's hand washable o Patient to wear own compression stockings. Remove compression stockings every night before going to bed and put on every morning when getting up. - on RIGHT LEG call [employer] for compression sock 15-22 mm/Hg, measurements and phone number on blue sheet [employer] on [employer] in [place] also has the compression socks o Elevate, Exercise Daily and Avoid Standing for Long Periods of Time. o Elevate legs to the level of the heart and pump ankles as often as possible o Elevate leg(s) parallel to the floor when sitting. o Compression Pump: Use compression pump on left lower extremity for 60 minutes, twice daily. - You may pump over wraps o Compression Pump: Use compression pump on right lower extremity for 60 minutes, twice daily. - You may pump over wraps Additional Orders / Instructions Wound #2 Left,Lateral Lower Leg o Follow Nutritious Diet and Increase Protein Intake - Increase protein intake. Wound Treatment Wound #2 - Lower Leg Wound Laterality: Left, Lateral Cleanser: Soap and Water 1 x Per Week/30 Days Discharge Instructions: Gently cleanse wound with antibacterial soap, rinse and pat dry prior to dressing wounds Primary Dressing: Prisma 4.34 (in) 1 x Per Week/30 Days Discharge Instructions: Moisten with hydrogel Secondary Dressing: ABD Pad 5x9 (in/in) 1 x Per Week/30 Days Discharge Instructions: Cover with ABD pad Compression Wrap: Profore Lite LF 3 Multilayer Compression Bandaging System 1 x Per Week/30 Days Discharge Instructions: Apply 3 multi-layer wrap as prescribed. Electronic Signature(s) Signed:  07/30/2021 5:02:50 PM By: Worthy Keeler PA-C Signed: 08/14/2021 1:44:34 PM By: Carlene Coria RN Entered By: Carlene Coria on 07/30/2021 14:03:49 [name] ([id number]) -------------------------------------------------------------------------------- Problem List Details Patient Name: [name] Date of Service: 07/30/2021 1:15 PM Medical Record Number: [id number] Patient Account Number: [id number] Date of Birth/Sex: June 29, 1946 (75 y.o. F) Treating RN: Carlene Coria Primary Care Provider: [name] Other Clinician: Referring Provider: [name] Treating Provider/Extender: Skipper Cliche in Treatment: 27 Active Problems ICD-10 Encounter Code Description Active Date MDM Diagnosis I89.0 Lymphedema, not elsewhere classified 01/16/2021 No Yes I87.332 Chronic venous hypertension (idiopathic) with ulcer and inflammation of 01/16/2021 No Yes left lower extremity L97.828 Non-pressure chronic ulcer of other part of left lower leg with other 01/16/2021 No Yes specified severity Inactive Problems Resolved Problems Electronic Signature(s) Signed: 07/30/2021 4:47:39 PM By: Worthy Keeler PA-C Previous Signature: 07/30/2021 1:41:04 PM Version By: Worthy Keeler PA-C Entered By: Worthy Keeler on 07/30/2021 16:47:38 [name] ([id number]) -------------------------------------------------------------------------------- Progress Note Details Patient Name: [name] Date of Service: 07/30/2021 1:15 PM Medical Record Number: [id number] Patient Account Number: [id number] Date of Birth/Sex: 12/22/1945 (75 y.o. F) Treating RN: Carlene Coria Primary Care Provider: [name] Other Clinician: Referring Provider: [name] Treating Provider/Extender: Skipper Cliche in Treatment:  27 Subjective Chief Complaint Information obtained from Patient Left LE Ulcer History of Present Illness (HPI) 03/15/2020 upon evaluation today patient presents today for initial  evaluation here in our clinic concerning a wound that is on the left posterior lower extremity. Unfortunately this has been giving the patient some discomfort at this point which she notes has been affected her pretty much daily. The patient does have a history of venous insufficiency, hypertension, congestive heart failure, and a history of breast cancer. Fortunately there does not appear to be evidence of active infection at this time which is great news. No fevers, chills, nausea, vomiting, or diarrhea. Wound is not extremely large but does have some slough covering the surface of the wound. This is going require sharp debridement today. 03/15/2020 upon evaluation today patient appears to be doing fairly well in regard to her wound. She does have some slough noted on the surface of the wound currently but I do believe the Iodoflex has been beneficial over the past week. There is no signs of active infection at this time which is great news and overall very pleased with the progress. No fevers, chills, nausea, vomiting, or diarrhea. 04/02/2020 upon evaluation today patient appears to be doing a little better in regard to her wound size wise this is not tremendously smaller she does have some slough buildup on the surface of the wound. With that being said this is going require some sharp debridement today to clear away some of the necrotic debris. Fortunately there is no evidence of active infection at this time. No fevers, chills, nausea, vomiting, or diarrhea. 04/10/2020 on evaluation today patient appears to be doing well with regard to her ulcer which is measuring somewhat smaller today. She actually has more granulation tissue noted which is also good news is no need for sharp debridement today. Fortunately there is no evidence of infection either which is also excellent. No fevers, chills, nausea, vomiting, or diarrhea. 04/26/2020 on evaluation today patient's wound actually is showing signs of good  improvement which is great news. There does not appear to be any evidence of active infection. I do believe she is tolerating the collagen at this point. 05/03/2020 upon evaluation today patient's wound actually showed signs of good granulation at this time there does not appear to be any evidence of active infection which is great news and overall very pleased with where things stand. With that being said I do believe that the patient is can require some sharp debridement today but fortunately nothing too significant. 05/11/20 on evaluation today patient appears to be doing well in regard to her leg ulcer. She's been tolerating the dressing changes without complication. Fortunately there is no signs of active infection at this time. Overall I'm very pleased with where things stand. 05/18/2020 upon evaluation today patient's wound is showing signs of improvement is very dry and I think the collagen for the most part just dried to the wound bed. I am going to clean this away with sharp debridement in order to get this under control in my opinion. 05/25/2020 upon evaluation today patient actually appears to be doing well in regard to her leg ulcer. In fact upon inspection it appears she could potentially be completely healed but that is not guaranteed based on what we are seeing. There does not appear to be any signs of active infection which is great news. 05/31/2020 on evaluation today patient appears to be doing well with regard to her wounds. She in fact appears  to be almost completely healed on the left leg there is just a very small opening remaining point 1 06/08/2020 upon evaluation today patient appears to be doing excellent in regard to her leg ulcer on the left in fact it appears to be that she is completely healed as of today. I see no signs of active infection at this time which is great news. No fevers, chills, nausea, vomiting, or diarrhea. READMISSION 01/16/2021 Patient that we have had in  this clinic with a wound on the left posterior calf in the summer 2021 extending into October. She was felt to have chronic venous insufficiency. Per the patient's description she was discharged with what sounds like external compression stockings although she could not afford the "$75 per leg". She therefore did not get anything. Apparently her wound that she has currently is been in existence since January. She has been followed at vein and vascular with Unna boots and I think calcium alginate. She had ABIs done on 06/25/2020 that showed an noncompressible ABI on the right leg and the left leg but triphasic waveforms with good great toe pressures and a TBI of 0.90 on the right and 0.91 on the left Surprisingly the patient also had venous reflux studies that were really unremarkable. This included no evidence of DVTs or SVTs but there was no evidence of deep venous insufficiency or superficial venous insufficiency in the greater saphenous or short saphenous veins bilaterally. I would wonder about more central venous issues or perhaps this is all lymphedema 01/25/2021 upon evaluation today patient appears to be doing decently well and guard to her wound. The good news is the Iodoflex that is job she saw Dr. Dellia Nims last week for readmission and to be honest I think that this has done extremely well over that week. I do believe that she can tolerate the sharp debridement today which will be good. I think that cleaning off the wound will likely allow [place] to be able to use a different type of dressing I do not think the Iodoflex will even be necessary based on how clean the wound looks. Fortunately there is no sign of active infection at this time which is great news. No fevers, chills, nausea, vomiting, or diarrhea. LEILANNI, HALVORSON ([id number]) 02/12/2021 upon evaluation today patient appears to be doing well with regard to her leg ulcer. We did switch to alginate when she came for nurse visit I think is  doing much better for her. Fortunately there does not appear to be any signs of active infection at this time which is great news. No fevers, chills, nausea, vomiting, or diarrhea. 02/18/2021 upon evaluation today patient's wound is actually showing signs of good improvement. I am very pleased with where things stand currently. I do not see any signs of active infection which is great news and overall I feel like she is making good progress. The patient likewise is happy that things are doing so well and that this is measuring somewhat smaller. 02/25/2021 upon evaluation today patient actually appears to be doing quite well in regard to her wounds. She has been tolerating the dressing changes without complication. Fortunately there does not appear to be any signs of active infection which is great news and overall I am extremely pleased with where things stand. No fevers, chills, nausea, vomiting, or diarrhea. She does have a lot of edema I really do think she needs compression socks to be worn in order to prevent things from causing additional issues for her  currently. Especially on the right leg she should be wearing compression socks all the time to be honest. 03/04/2021 upon evaluation today patient's leg ulcer actually appears to be doing pretty well which is great news. There does not appear to be any significant change but overall the appearance is better even though the size is not necessarily reflecting a great improvement. Fortunately there does not appear to be any signs of active infection. No fevers, chills, nausea, vomiting, or diarrhea. 03/12/2021 upon evaluation today patient appears to be doing well with regard to her wounds. She has been tolerating the dressing changes without complication. The good news is that the wound on her left lateral leg is doing much better and is showing signs of good improvement. Overall her swelling is controlled well as well. She does need some compression  socks she plans to go Jefferey socks next week. 03/18/2021 on evaluation today patient's wound actually is showing signs of good improvement. She does have a little bit of slough this can require some sharp debridement today. 03/25/2021 upon evaluation today patient appears to be doing well with regard to her wound on the left lateral leg. She has been tolerating the dressing changes without complication. Fortunately there does not appear to be any signs of active infection at this time. No fever chills no 04/01/2021 upon evaluation today patient's wound is showing signs of improvement and overall very pleased with where things stand at this point. There is no evidence of active infection which is great news as well and in general I think that she is making great progress. 04/09/2021 upon evaluation today patient appears to be doing well with regard to her ankle ulcer. She is making good progress currently which is great news. There does not appear to be any signs of infection also excellent news. In general I am extremely pleased with where things stand today. No fevers, chills, nausea, vomiting, or diarrhea. 04/23/2021 upon inspection today patient appears to be doing quite well with regard to her leg ulcer. She has been tolerating the dressing changes without complication. She is can require some sharp debridement today but overall seems to be doing excellent. 05/06/2021 upon evaluation today patient unfortunately appears to be doing poorly overall in regard to her swelling. She is actually significantly more swollen than last week despite the fact that her wound is doing better this is not a good trend. I think that she probably needs to see her cardiologist ASAP and I discussed that with her today. This includes the fact that honestly I think she probably is getting need an increase in her diuretic or something to try to clear away some of the excess fluid that she is experiencing at the moment. She is  not been doing her pumps even twice a day but again right now more concerned that if she were to do the pump she would fluid overload her lungs causing other issues as far as her breathing is concerned. Obviously I think she needs to contact them and move up the appointment for the 12th of something sooner 2 weeks is a bit too long to wait with how swollen she has. 05/14/2021 upon evaluation today patient's wound actually showing signs of being a little bit smaller compared to previous. She has been making good progress however. Fortunately there does not appear to be any evidence of active infection which is great. Overall I am extremely pleased in that regard. Nonetheless I am still concerned that she is having quite  a bit of issue as far as the swelling is concerned she has been placed on fluid restriction as well as an increase in the furosemide by her doctor. We will see how things progress. 05/21/2021 upon evaluation today patient appears to be doing well with regard to her wound in general. She has been tolerating the dressing changes without complication. With that being said she has been using silver cell for a while and while this was showing signs of improvement it is now gotten to the point where this has slowed down quite significantly. I do believe that switching to a different dressing may be beneficial for her today. 05/28/2021 upon evaluation today patient actually appears to be doing quite well in regard to her wound. In fact there is really not any need for sharp debridement today based on what I see. The Hydrofera Blue is doing great and overall I think that she is managing quite nicely with the compression wrapping. Her edema is still down quite a bit with the wrapping in place. 06/11/2021 upon evaluation today patient appears to be doing well with regard to her leg ulcer. She has been tolerating the dressing changes without complication. Fortunately there does not appear to be any  signs of active infection at this time which is great news. No fevers, chills, nausea, vomiting, or diarrhea. She is going require some sharp debridement today. 10/11; left leg ulcer much better looking and with improved surface area. She is using Hydrofera Blue under 3 layer compression. She does not have any stocking on the right leg which in itself has very significant nonpitting edema 06/25/2021 upon evaluation today patient appears to be doing well with regard to the wound on her left leg. She has been tolerating the dressing changes without complication and overall I am extremely pleased with where things stand today. I do not see any evidence of infection which is great news. 07/02/2021 upon evaluation today patient's wound actually showing signs of excellent improvement and actually very pleased with where we stand today and the overall appearance. Fortunately there does not appear to be any signs of active infection. No fevers, chills, nausea, vomiting, or diarrhea. 07/09/2021 upon evaluation today patient appears to be doing better in regard to her wound this is measuring smaller and she is headed in the appropriate direction based on what I am seeing currently. I do not see any evidence of active infection systemically which is great news. 07/16/2021 upon evaluation today patient appears to be doing pretty well currently in regard to her leg ulcer. This is measuring smaller and looking much better this is great news. Fortunately there does not appear to be any evidence of active infection systemically which is great news DOSSIE, OCANAS ([id number]) as well. 07/23/2021 upon evaluation today patient's wound is actually showing signs of good improvement this is definitely measuring smaller. I do not see any signs of infection which is great news and overall very pleased in that regard. Overall I think that she is doing well with the [facility]. 07/30/2020 upon evaluation today patient  appears to be doing well in regard to her wound. She has been tolerating the dressing changes without complication think the Hydrofera Blue at this point is actually causing her to dry out a lot as far as the wound bed is concerned. Subsequently I think that we need to try to see what we can do to improve this. Fortunately there does not appear to be any evidence of active infection at  this time which is great news. Objective Constitutional Well-nourished and well-hydrated in no acute distress. Vitals Time Taken: 1:40 PM, Height: 69 in, Weight: 244 lbs, BMI: 36, Temperature: 98.4 F, Pulse: 67 bpm, Respiratory Rate: 18 breaths/min, Blood Pressure: 172/99 mmHg. Respiratory normal breathing without difficulty. Psychiatric this patient is able to make decisions and demonstrates good insight into disease process. Alert and Oriented x 3. pleasant and cooperative. General Notes: Upon inspection patient's wound bed showed signs of good granulation and epithelization at this point. Overall I am extremely happy with the fact that the wound is getting somewhat smaller although I think we need to try something all different I would recommend that we use collagen at this point and discontinue the [facility] Integumentary (Hair, Skin) Wound #2 status is Open. Original cause of wound was Gradually Appeared. The date acquired was: 09/08/2020. The wound has been in treatment 27 weeks. The wound is located on the Left,Lateral Lower Leg. The wound measures 0.6cm length x 0.4cm width x 0.1cm depth; 0.188cm^2 area and 0.019cm^3 volume. There is Fat Layer (Subcutaneous Tissue) exposed. There is no tunneling or undermining noted. There is a medium amount of serosanguineous drainage noted. There is large (67-100%) red granulation within the wound bed. There is no necrotic tissue within the wound bed. Assessment Active Problems ICD-10 Lymphedema, not elsewhere classified Chronic venous hypertension  (idiopathic) with ulcer and inflammation of left lower extremity Non-pressure chronic ulcer of other part of left lower leg with other specified severity Procedures Wound #2 Pre-procedure diagnosis of Wound #2 is a Venous Leg Ulcer located on the Left,Lateral Lower Leg .Severity of Tissue Pre Debridement is: Fat layer exposed. There was a Excisional Skin/Subcutaneous Tissue Debridement with a total area of 0.24 sq cm performed by Tommie Sams., PA-C. With the following instrument(s): Curette to remove Viable and Non-Viable tissue/material. Material removed includes Subcutaneous Tissue, Slough, Skin: Dermis, and Skin: Epidermis. No specimens were taken. A time out was conducted at 13:55, prior to the start of the procedure. A Minimum amount of bleeding was controlled with Pressure. The procedure was tolerated well with a pain level of 0 throughout and a pain level of 0 following the procedure. Post Debridement Measurements: 0.6cm length x 0.4cm width x 0.1cm depth; 0.019cm^3 volume. Character of Wound/Ulcer Post Debridement is improved. Severity of Tissue Post Debridement is: Fat layer exposed. Post procedure Diagnosis Wound #2: Same as Pre-Procedure KALIS, FRIESE ([id number]) Plan Follow-up Appointments: Return Appointment in 1 week. Bathing/ Shower/ Hygiene: May shower with wound dressing protected with water repellent cover or cast protector. Edema Control - Lymphedema / Segmental Compressive Device / Other: Optional: One layer of unna paste to top of compression wrap (to act as an anchor). Tubigrip single layer applied. - RIGHT LEG tubi grip applied "E" until you get your compression sock use this and it's hand washable Patient to wear own compression stockings. Remove compression stockings every night before going to bed and put on every morning when getting up. - on RIGHT LEG call [facility] for compression sock 15-22 mm/Hg, measurements and phone number on blue sheet  [employer] on [employer] in [place] also has the compression socks Elevate, Exercise Daily and Avoid Standing for Long Periods of Time. Elevate legs to the level of the heart and pump ankles as often as possible Elevate leg(s) parallel to the floor when sitting. Compression Pump: Use compression pump on left lower extremity for 60 minutes, twice daily. - You may pump over wraps Compression Pump:  Use compression pump on right lower extremity for 60 minutes, twice daily. - You may pump over wraps Additional Orders / Instructions: Wound #2 Left,Lateral Lower Leg: Follow Nutritious Diet and Increase Protein Intake - Increase protein intake. WOUND #2: - Lower Leg Wound Laterality: Left, Lateral Cleanser: Soap and Water 1 x Per Week/30 Days Discharge Instructions: Gently cleanse wound with antibacterial soap, rinse and pat dry prior to dressing wounds Primary Dressing: Prisma 4.34 (in) 1 x Per Week/30 Days Discharge Instructions: Moisten with hydrogel Secondary Dressing: ABD Pad 5x9 (in/in) 1 x Per Week/30 Days Discharge Instructions: Cover with ABD pad Compression Wrap: Profore Lite LF 3 Multilayer Compression Bandaging System 1 x Per Week/30 Days Discharge Instructions: Apply 3 multi-layer wrap as prescribed. 1. Would recommend currently that we go ahead and initiate treatment with the silver collagen and stop the [facility] we will see how that does over the next week we will use hydrogel to moisten it with. 2. I am also can recommend that we use a 3 layer compression wrap after applying ABD pad cover. 3. I am also can recommend the patient should continue to elevate her leg is much as possible to help with edema control. We will see patient back for reevaluation in 1 week here in the clinic. If anything worsens or changes patient will contact our office for additional recommendations. Electronic Signature(s) Signed: 07/30/2021 4:49:16 PM By: Worthy Keeler PA-C Entered By:  Worthy Keeler on 07/30/2021 16:49:16 [name] ([id number]) -------------------------------------------------------------------------------- SuperBill Details Patient Name: [name] Date of Service: 07/30/2021 Medical Record Number: [id number] Patient Account Number: [id number] Date of Birth/Sex: 10-22-1945 (75 y.o. F) Treating RN: Carlene Coria Primary Care Provider: [name] Other Clinician: Referring Provider: [name] Treating Provider/Extender: Skipper Cliche in Treatment: 27 Diagnosis Coding ICD-10 Codes Code Description I89.0 Lymphedema, not elsewhere classified I87.332 Chronic venous hypertension (idiopathic) with ulcer and inflammation of left lower extremity L97.828 Non-pressure chronic ulcer of other part of left lower leg with other specified severity Facility Procedures CPT4 Code: 32202542 Description: 70623 - DEB SUBQ TISSUE 20 SQ CM/< Modifier: Quantity: 1 CPT4 Code: Description: ICD-10 Diagnosis Description L97.828 Non-pressure chronic ulcer of other part of left lower leg with other spec Modifier: ified severity Quantity: Physician Procedures CPT4 Code: 7628315 Description: 17616 - WC PHYS SUBQ TISS 20 SQ CM Modifier: Quantity: 1 CPT4 Code: Description: ICD-10 Diagnosis Description L97.828 Non-pressure chronic ulcer of other part of left lower leg with other spec Modifier: ified severity Quantity: Electronic Signature(s) Signed: 07/30/2021 4:49:29 PM By: Worthy Keeler PA-C Previous Signature: 07/30/2021 4:12:56 PM Version By: Carlene Coria RN Entered By: Worthy Keeler on 07/30/2021 16:49:28

## 2021-08-05 ENCOUNTER — Ambulatory Visit (INDEPENDENT_AMBULATORY_CARE_PROVIDER_SITE_OTHER): Payer: Medicare Other | Admitting: Cardiovascular Disease

## 2021-08-05 ENCOUNTER — Encounter: Payer: Self-pay | Admitting: Cardiovascular Disease

## 2021-08-05 ENCOUNTER — Other Ambulatory Visit: Payer: Self-pay | Admitting: Cardiovascular Disease

## 2021-08-05 ENCOUNTER — Other Ambulatory Visit: Payer: Self-pay

## 2021-08-05 VITALS — BP 120/60 | HR 64 | Ht 65.0 in | Wt 245.0 lb

## 2021-08-05 DIAGNOSIS — N183 Chronic kidney disease, stage 3 unspecified: Secondary | ICD-10-CM

## 2021-08-05 DIAGNOSIS — I10 Essential (primary) hypertension: Secondary | ICD-10-CM

## 2021-08-05 DIAGNOSIS — I5032 Chronic diastolic (congestive) heart failure: Secondary | ICD-10-CM

## 2021-08-05 DIAGNOSIS — I890 Lymphedema, not elsewhere classified: Secondary | ICD-10-CM

## 2021-08-05 DIAGNOSIS — R0602 Shortness of breath: Secondary | ICD-10-CM

## 2021-08-05 DIAGNOSIS — I739 Peripheral vascular disease, unspecified: Secondary | ICD-10-CM

## 2021-08-05 MED ORDER — POTASSIUM CHLORIDE CRYS ER 20 MEQ PO TBCR: 20.0000 meq | EXTENDED_RELEASE_TABLET | Freq: Two times a day (BID) | ORAL | 3 refills | Status: DC

## 2021-08-05 MED ORDER — TORSEMIDE 40 MG PO TABS: 40.0000 mg | ORAL_TABLET | Freq: Two times a day (BID) | ORAL | 3 refills | Status: DC

## 2021-08-05 MED ORDER — CLONIDINE HCL 0.2 MG PO TABS: 0.2000 mg | ORAL_TABLET | Freq: Two times a day (BID) | ORAL | 4 refills | Status: DC

## 2021-08-05 MED ORDER — LOSARTAN POTASSIUM 25 MG PO TABS: 25.0000 mg | ORAL_TABLET | Freq: Every day | ORAL | 3 refills | Status: DC

## 2021-08-05 NOTE — Progress Notes (Signed)
Date:  08/05/2021   ID:  [name], DOB 29-Mar-1946, MRN [id number]  Patient Location:  [place] [address]   Provider location:   [facility], [place] office  PCP:  Donnie Coffin, MD  Cardiologist:  Patsy [place]   Chief Complaint  Patient presents with   1 month follow up     "Doing well." Medications reviewed by the patient verbally.     History of Present Illness:    [name] is a 75 y.o. female past medical history of Medication confusion/Medication noncompliance No family to help, uses [employer] for transportation morbid obesity,  severe lower extremity edema, dating back to 6606 systolic CHF, ejection fraction 25% in 2012  , Up to EF 50% to 55% in 03/2017 sleep apnea, scheduled for repeat testing. Previous machine broke pulmonary disease,  severe hypertension,  likely secondary to medication noncompliance who presents For follow-up of her hypertension, cardiomyopathy, edema  LOV 10/22, No family with her on today's visit Weight down 10 pounds, 254 pounds down to 245 pounds On torsemide 40 BID Cut back on fluids  Presents with wraps in place on the left Still with significant bilateral leg swelling  Main complaint, Having migraines  Neuropathy in toes, Toes with pain, throb By her account, sounds like she sleeps with her toes/legs down, possibly recliner  EKG personally reviewed by myself on todays visit  nsr rate 64, LAD  Other past medical history reviewed  June 22 lab work Creatinine 1.2 BUN 29 potassium 4.0 Hemoglobin 11.2 down from 12.4  Not using her CPAP Previously received a new machine from [place] mask  too small   hospitalization July 2018 for TIA and chest pain Slurring, head hurt, H/A, felt hot She had MRI brain without acute finding Neurology consult with no clear recommendations Chest pain resolved without intervention Carotid ultrasound without significant stenosis SNF was  recommended    Past Medical History:  Diagnosis Date   Abnormality of gait    Breast cancer ([place]) 02/05/2005   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    Breast cancer ([place]) 2019   right breast   CHF (congestive heart failure) ([place])    March 28, 2017 echo: EF 50-55%; 1) echo 07/2011: EF 20-25%, mild concentric hypertrophy, diffuse HK, regional WMA cannot be excluded, grade 1 DD, mitral valve with mild regurg, LA mildly dilated). 2) EF 40-45%, mild AS,                     Complication of anesthesia 2006   pt states "hard to wake up" after L breast surgery    Edema    GERD (gastroesophageal reflux disease)    Headache    Heart murmur    Hypertension    Hypokinesis    global, EF 35-45 % from Echo.   Malignant neoplasm of breast (female), unspecified site 06/14/2018   RIGHT T1b, N0; ER/PR positive, HER-2 negative.   Neuropathy    Obesity    Pain in joint, site unspecified    Personal history of chemotherapy 2006   left breast   Personal history of radiation therapy 2019   right breast   Personal history of radiation therapy 2006   left breast   Pneumonia    Sleep apnea    Tinea pedis    Unspecified hearing loss    bilateral   Past Surgical History:  Procedure Laterality Date   BREAST BIOPSY Right 2019   11 oc-INVASIVE MAMMARY CARCINOMA, NO SPECIAL TYPE   BREAST BIOPSY Right 2019   1030 oc- neg   BREAST LUMPECTOMY  2006   left breast    CATARACT EXTRACTION     left eye   HERNIA REPAIR  05/25/2006   Incarcerated omentum in ventral hernia, Ventralight mesh, combined lap/ open with omental resection.    PARTIAL MASTECTOMY WITH NEEDLE LOCALIZATION Right 06/14/2018   Procedure: PARTIAL MASTECTOMY WITH NEEDLE LOCALIZATION;  Surgeon: Robert Bellow, MD;  Location: [facility] ORS;  Service: General;  Laterality: Right;   PORT-A-CATH REMOVAL  05/25/2006   SENTINEL NODE BIOPSY Right 06/14/2018    Procedure: SENTINEL NODE BIOPSY;  Surgeon: Robert Bellow, MD;  Location: [facility] ORS;  Service: General;  Laterality: Right;   TUBAL LIGATION       Current Meds  Medication Sig   anastrozole (ARIMIDEX) 1 MG tablet TAKE 1 TABLET BY MOUTH DAILY. PATIENT MUST MAKE AN APPT TO SEE DR RAO   aspirin EC 81 MG tablet Take 81 mg by mouth daily.   cloNIDine (CATAPRES) 0.2 MG tablet Take 1 tablet (0.2 mg total) by mouth 2 (two) times daily.   clotrimazole (LOTRIMIN) 1 % cream Apply 1 application topically 2 (two) times daily.    [employer] CALCIUM 600 & VITAMIN D3 600-800 MG-UNIT TABS TAKE 1 TABLET BY MOUTH TWICE A DAY   diclofenac Sodium (VOLTAREN) 1 % GEL Apply 1 application topically as needed.   Incontinence Supply Disposable (BLADDER CONTROL PADS EX ABSORB) MISC Apply 1 Dose topically as needed.    isosorbide mononitrate (IMDUR) 60 MG 24 hr tablet Take 1 tablet (60 mg total) by mouth 2 (two) times daily.   loratadine (CLARITIN) 10 MG tablet Take 10 mg by mouth daily.   losartan (COZAAR) 25 MG tablet Take 25 mg by mouth daily.    methocarbamol (ROBAXIN) 500 MG tablet Take 500 mg by mouth 2 (two) times daily as needed.   MIRALAX 17 GM/SCOOP powder SMARTSIG:2 Scoopful By Mouth Daily PRN   omeprazole (PRILOSEC) 40 MG capsule Take 40 mg by mouth daily.   oxybutynin (DITROPAN) 5 MG tablet Take 5 mg by mouth 2 (two) times daily.   potassium chloride SA (K-DUR) 20 MEQ tablet Take 1 tablet (20 meq) by mouth once daily   Torsemide 40 MG TABS Take 40 mg by mouth 2 (two) times daily. Take in the am and after lunch for when weight is 240 lbs or greater. Take 40 mg daily when your weight is 225 or less   triamcinolone cream (KENALOG) 0.1 % Apply 1 application topically 2 (two) times daily. Back of legs     Allergies:   Patient has no known allergies.   Social History   Tobacco Use   Smoking status: Former    Packs/day: 0.50    Years: 30.00    Pack years: 15.00    Types: Cigarettes    Quit date: 07/14/2000     Years since quitting: 21.0   Smokeless tobacco: Never  Vaping Use   Vaping Use: Never used  Substance Use Topics   Alcohol use: No   Drug use: Not Currently    Types: Marijuana    Comment: endorses prior marijuana use 1999     Family Hx: The patient's family history includes Breast cancer (age of onset: 24) in an other family member; Cataracts in her mother; Coronary artery disease in an other family member; Hypertension  in her mother.  ROS:   Please see the history of present illness.    Review of Systems  Constitutional: Negative.   HENT: Negative.    Respiratory: Negative.    Cardiovascular:  Positive for leg swelling.  Gastrointestinal: Negative.   Musculoskeletal: Negative.   Neurological: Negative.   Psychiatric/Behavioral: Negative.    All other systems reviewed and are negative.   Labs/Other Tests and Data Reviewed:    Recent Labs: 02/22/2021: ALT 15; Hemoglobin 11.2; Platelets 273 07/04/2021: BUN 27; Creatinine, Ser 1.16; Potassium 3.8; Sodium 142   Recent Lipid Panel Lab Results  Component Value Date/Time   CHOL 141 03/28/2017 06:39 AM   TRIG 40 03/28/2017 06:39 AM   HDL 42 03/28/2017 06:39 AM   CHOLHDL 3.4 03/28/2017 06:39 AM   LDLCALC 91 03/28/2017 06:39 AM    Wt Readings from Last 3 Encounters:  08/05/21 245 lb (111.1 kg)  07/04/21 243 lb 8 oz (110.5 kg)  06/12/21 254 lb 2 oz (115.3 kg)     Exam:    Vital Signs: Vital signs may also be detailed in the HPI BP 120/60 (BP Location: Left Arm, Patient Position: Sitting, Cuff Size: Large)   Pulse 64   Ht _0  (1.651 m)   Wt 245 lb (111.1 kg)   SpO2 96%   BMI 40.77 kg/m   Constitutional:  oriented to person, place, and time. No distress.  HENT:  Head: Grossly normal Eyes:  no discharge. No scleral icterus.  Neck: No JVD, no carotid bruits  Cardiovascular: Regular rate and rhythm, no murmurs appreciated 2+ pitting lower extremity edema Pulmonary/Chest: Clear to auscultation bilaterally, no  wheezes or rails Abdominal: Soft.  no distension.  no tenderness.  Musculoskeletal: Normal range of motion Neurological:  normal muscle tone. Coordination normal. No atrophy Skin: Skin warm and dry Psychiatric: normal affect, pleasant  ASSESSMENT & PLAN:    Chronic diastolic CHF Weight down 9 to 10 pounds from prior clinic visit 1 month ago Recommend she continue torsemide 40 twice daily, fluid restriction if she is doing Likely has additional 20 to 30 pounds of diuresis to go, goal 225 She has made progress by cutting back on her sodas, sweet tea, coffee and taking her torsemide  Morbid obesity (HCC) Unable to exercise, recommended low carbohydrates, medication compliance  SOB (shortness of breath) Stressed importance of taking her medications as above  Essential hypertension Blood pressure is well controlled on today's visit. No changes made to the medications.  OSA on CPAP Current mask on new machine does not fit her face History of noncompliance  Lymphedema Stressed compliance with her lymphedema compression pumps Leg elevation, torsemide 40 twice daily, fluid restriction    Total encounter time more than 25 minutes  Greater than 50% was spent in counseling and coordination of care with the patient   Signed, Ida Rogue, MD  08/05/2021 3:47 PM    [place] [facility] [address], [place], [place] [address]

## 2021-08-05 NOTE — Patient Instructions (Addendum)
[facility] [place], [place]  [address]  Phone: [phone] Phone: [phone]  Medication Instructions:  Please START Potassium 20 meq am and pm  If you need a refill on your cardiac medications before your next appointment, please call your pharmacy.   Lab work: No new labs needed  Testing/Procedures: No new testing needed  Follow-Up: At [facility], you and your health needs are our priority.  As part of our continuing mission to provide you with exceptional heart care, we have created designated Provider Care Teams.  These Care Teams include your primary Cardiologist (physician) and Advanced Practice Providers (APPs -  Physician Assistants and Nurse Practitioners) who all work together to provide you with the care you need, when you need it.  You will need a follow up appointment in  3 months, APP ok  Providers on your designated Care Team:   Murray Hodgkins, NP Christell Faith, PA-C Cadence Kathlen Mody, [place]  COVID-19 Vaccine Information can be found at: [email or web address] For questions related to vaccine distribution or appointments, please email [email or web address] or call [phone].

## 2021-08-06 ENCOUNTER — Encounter: Payer: Medicare Other | Admitting: Physician Assistant

## 2021-08-06 DIAGNOSIS — I890 Lymphedema, not elsewhere classified: Secondary | ICD-10-CM | POA: Diagnosis not present

## 2021-08-06 NOTE — Progress Notes (Addendum)
CLARECE, DRZEWIECKI ([id number]) Visit Report for 08/06/2021 Chief Complaint Document Details Patient Name: [name], [name] Date of Service: 08/06/2021 1:15 PM Medical Record Number: [id number] Patient Account Number: [id number] Date of Birth/Sex: 1946/02/04 (75 y.o. F) Treating RN: Carlene Coria Primary Care Provider: [name] Other Clinician: Referring Provider: [name] Treating Provider/Extender: Skipper Cliche in Treatment: 28 Information Obtained from: Patient Chief Complaint Left LE Ulcer Electronic Signature(s) Signed: 08/06/2021 1:26:15 PM By: Worthy Keeler PA-C Entered By: Worthy Keeler on 08/06/2021 13:26:14 [name] ([id number]) -------------------------------------------------------------------------------- HPI Details Patient Name: [name] Date of Service: 08/06/2021 1:15 PM Medical Record Number: [id number] Patient Account Number: [id number] Date of Birth/Sex: [birth year] (75 y.o. F) Treating RN: Carlene Coria Primary Care Provider: [name] Other Clinician: Referring Provider: [name] Treating Provider/Extender: Skipper Cliche in Treatment: 28 History of Present Illness HPI Description: 03/15/2020 upon evaluation today patient presents today for initial evaluation here in our clinic concerning a wound that is on the left posterior lower extremity. Unfortunately this has been giving the patient some discomfort at this point which she notes has been affected her pretty much daily. The patient does have a history of venous insufficiency, hypertension, congestive heart failure, and a history of breast cancer. Fortunately there does not appear to be evidence of active infection at this time which is great news. No fevers, chills, nausea, vomiting, or diarrhea. Wound is not extremely large but does have some slough covering the surface of the wound. This is going require sharp debridement today. 03/15/2020 upon evaluation today patient  appears to be doing fairly well in regard to her wound. She does have some slough noted on the surface of the wound currently but I do believe the Iodoflex has been beneficial over the past week. There is no signs of active infection at this time which is great news and overall very pleased with the progress. No fevers, chills, nausea, vomiting, or diarrhea. 04/02/2020 upon evaluation today patient appears to be doing a little better in regard to her wound size wise this is not tremendously smaller she does have some slough buildup on the surface of the wound. With that being said this is going require some sharp debridement today to clear away some of the necrotic debris. Fortunately there is no evidence of active infection at this time. No fevers, chills, nausea, vomiting, or diarrhea. 04/10/2020 on evaluation today patient appears to be doing well with regard to her ulcer which is measuring somewhat smaller today. She actually has more granulation tissue noted which is also good news is no need for sharp debridement today. Fortunately there is no evidence of infection either which is also excellent. No fevers, chills, nausea, vomiting, or diarrhea. 04/26/2020 on evaluation today patient's wound actually is showing signs of good improvement which is great news. There does not appear to be any evidence of active infection. I do believe she is tolerating the collagen at this point. 05/03/2020 upon evaluation today patient's wound actually showed signs of good granulation at this time there does not appear to be any evidence of active infection which is great news and overall very pleased with where things stand. With that being said I do believe that the patient is can require some sharp debridement today but fortunately nothing too significant. 05/11/20 on evaluation today patient appears to be doing well in regard to her leg ulcer. She's been tolerating the dressing changes without complication.  Fortunately there is no signs of active infection at this time.  Overall I'm very pleased with where things stand. 05/18/2020 upon evaluation today patient's wound is showing signs of improvement is very dry and I think the collagen for the most part just dried to the wound bed. I am going to clean this away with sharp debridement in order to get this under control in my opinion. 05/25/2020 upon evaluation today patient actually appears to be doing well in regard to her leg ulcer. In fact upon inspection it appears she could potentially be completely healed but that is not guaranteed based on what we are seeing. There does not appear to be any signs of active infection which is great news. 05/31/2020 on evaluation today patient appears to be doing well with regard to her wounds. She in fact appears to be almost completely healed on the left leg there is just a very small opening remaining point 1 06/08/2020 upon evaluation today patient appears to be doing excellent in regard to her leg ulcer on the left in fact it appears to be that she is completely healed as of today. I see no signs of active infection at this time which is great news. No fevers, chills, nausea, vomiting, or diarrhea. READMISSION 01/16/2021 Patient that we have had in this clinic with a wound on the left posterior calf in the summer 2021 extending into October. She was felt to have chronic venous insufficiency. Per the patient's description she was discharged with what sounds like external compression stockings although she could not afford the "$75 per leg". She therefore did not get anything. Apparently her wound that she has currently is been in existence since January. She has been followed at vein and vascular with Unna boots and I think calcium alginate. She had ABIs done on 06/25/2020 that showed an noncompressible ABI on the right leg and the left leg but triphasic waveforms with good great toe pressures and a TBI of 0.90 on the  right and 0.91 on the left Surprisingly the patient also had venous reflux studies that were really unremarkable. This included no evidence of DVTs or SVTs but there was no evidence of deep venous insufficiency or superficial venous insufficiency in the greater saphenous or short saphenous veins bilaterally. I would wonder about more central venous issues or perhaps this is all lymphedema 01/25/2021 upon evaluation today patient appears to be doing decently well and guard to her wound. The good news is the Iodoflex that is job she saw Dr. Dellia Nims last week for readmission and to be honest I think that this has done extremely well over that week. I do believe that she can tolerate the sharp debridement today which will be good. I think that cleaning off the wound will likely allow [place] to be able to use a different type of dressing I do not think the Iodoflex will even be necessary based on how clean the wound looks. Fortunately there is no sign of active infection at this time which is great news. No fevers, chills, nausea, vomiting, or diarrhea. 02/12/2021 upon evaluation today patient appears to be doing well with regard to her leg ulcer. We did switch to alginate when she came for nurse visit I think is doing much better for her. Fortunately there does not appear to be any signs of active infection at this time which is great news. No fevers, chills, nausea, vomiting, or diarrhea. [name], [name] ([id number]) 02/18/2021 upon evaluation today patient's wound is actually showing signs of good improvement. I am very pleased with where things stand  currently. I do not see any signs of active infection which is great news and overall I feel like she is making good progress. The patient likewise is happy that things are doing so well and that this is measuring somewhat smaller. 02/25/2021 upon evaluation today patient actually appears to be doing quite well in regard to her wounds. She has been tolerating the  dressing changes without complication. Fortunately there does not appear to be any signs of active infection which is great news and overall I am extremely pleased with where things stand. No fevers, chills, nausea, vomiting, or diarrhea. She does have a lot of edema I really do think she needs compression socks to be worn in order to prevent things from causing additional issues for her currently. Especially on the right leg she should be wearing compression socks all the time to be honest. 03/04/2021 upon evaluation today patient's leg ulcer actually appears to be doing pretty well which is great news. There does not appear to be any significant change but overall the appearance is better even though the size is not necessarily reflecting a great improvement. Fortunately there does not appear to be any signs of active infection. No fevers, chills, nausea, vomiting, or diarrhea. 03/12/2021 upon evaluation today patient appears to be doing well with regard to her wounds. She has been tolerating the dressing changes without complication. The good news is that the wound on her left lateral leg is doing much better and is showing signs of good improvement. Overall her swelling is controlled well as well. She does need some compression socks she plans to go Jefferey socks next week. 03/18/2021 on evaluation today patient's wound actually is showing signs of good improvement. She does have a little bit of slough this can require some sharp debridement today. 03/25/2021 upon evaluation today patient appears to be doing well with regard to her wound on the left lateral leg. She has been tolerating the dressing changes without complication. Fortunately there does not appear to be any signs of active infection at this time. No fever chills no 04/01/2021 upon evaluation today patient's wound is showing signs of improvement and overall very pleased with where things stand at this point. There is no evidence of active  infection which is great news as well and in general I think that she is making great progress. 04/09/2021 upon evaluation today patient appears to be doing well with regard to her ankle ulcer. She is making good progress currently which is great news. There does not appear to be any signs of infection also excellent news. In general I am extremely pleased with where things stand today. No fevers, chills, nausea, vomiting, or diarrhea. 04/23/2021 upon inspection today patient appears to be doing quite well with regard to her leg ulcer. She has been tolerating the dressing changes without complication. She is can require some sharp debridement today but overall seems to be doing excellent. 05/06/2021 upon evaluation today patient unfortunately appears to be doing poorly overall in regard to her swelling. She is actually significantly more swollen than last week despite the fact that her wound is doing better this is not a good trend. I think that she probably needs to see her cardiologist ASAP and I discussed that with her today. This includes the fact that honestly I think she probably is getting need an increase in her diuretic or something to try to clear away some of the excess fluid that she is experiencing at the moment. She is  not been doing her pumps even twice a day but again right now more concerned that if she were to do the pump she would fluid overload her lungs causing other issues as far as her breathing is concerned. Obviously I think she needs to contact them and move up the appointment for the 12th of something sooner 2 weeks is a bit too long to wait with how swollen she has. 05/14/2021 upon evaluation today patient's wound actually showing signs of being a little bit smaller compared to previous. She has been making good progress however. Fortunately there does not appear to be any evidence of active infection which is great. Overall I am extremely pleased in that regard. Nonetheless I am  still concerned that she is having quite a bit of issue as far as the swelling is concerned she has been placed on fluid restriction as well as an increase in the furosemide by her doctor. We will see how things progress. 05/21/2021 upon evaluation today patient appears to be doing well with regard to her wound in general. She has been tolerating the dressing changes without complication. With that being said she has been using silver cell for a while and while this was showing signs of improvement it is now gotten to the point where this has slowed down quite significantly. I do believe that switching to a different dressing may be beneficial for her today. 05/28/2021 upon evaluation today patient actually appears to be doing quite well in regard to her wound. In fact there is really not any need for sharp debridement today based on what I see. The Hydrofera Blue is doing great and overall I think that she is managing quite nicely with the compression wrapping. Her edema is still down quite a bit with the wrapping in place. 06/11/2021 upon evaluation today patient appears to be doing well with regard to her leg ulcer. She has been tolerating the dressing changes without complication. Fortunately there does not appear to be any signs of active infection at this time which is great news. No fevers, chills, nausea, vomiting, or diarrhea. She is going require some sharp debridement today. 10/11; left leg ulcer much better looking and with improved surface area. She is using Hydrofera Blue under 3 layer compression. She does not have any stocking on the right leg which in itself has very significant nonpitting edema 06/25/2021 upon evaluation today patient appears to be doing well with regard to the wound on her left leg. She has been tolerating the dressing changes without complication and overall I am extremely pleased with where things stand today. I do not see any evidence of infection which is great  news. 07/02/2021 upon evaluation today patient's wound actually showing signs of excellent improvement and actually very pleased with where we stand today and the overall appearance. Fortunately there does not appear to be any signs of active infection. No fevers, chills, nausea, vomiting, or diarrhea. 07/09/2021 upon evaluation today patient appears to be doing better in regard to her wound this is measuring smaller and she is headed in the appropriate direction based on what I am seeing currently. I do not see any evidence of active infection systemically which is great news. 07/16/2021 upon evaluation today patient appears to be doing pretty well currently in regard to her leg ulcer. This is measuring smaller and looking much better this is great news. Fortunately there does not appear to be any evidence of active infection systemically which is great news as well.  07/23/2021 upon evaluation today patient's wound is actually showing signs of good improvement this is definitely measuring smaller. I do not see any signs of infection which is great news and overall very pleased in that regard. Overall I think that she is doing well with the Kateria Cutrona, [place] ([id number]) [place]. 07/30/2020 upon evaluation today patient appears to be doing well in regard to her wound. She has been tolerating the dressing changes without complication think the Hydrofera Blue at this point is actually causing her to dry out a lot as far as the wound bed is concerned. Subsequently I think that we need to try to see what we can do to improve this. Fortunately there does not appear to be any evidence of active infection at this time which is great news. 08/06/2021 upon evaluation today patient appears to be doing well with regards to her wound. Is not measuring significantly smaller initial inspection but upon closer inspection she has a lot of new skin growth across the central portion of the wound. I think will  get very close to complete resolution. Electronic Signature(s) Signed: 08/06/2021 1:53:50 PM By: Worthy Keeler PA-C Entered By: Worthy Keeler on 08/06/2021 13:53:50 [name], [name] ([id number]) -------------------------------------------------------------------------------- Physical Exam Details Patient Name: [name] Date of Service: 08/06/2021 1:15 PM Medical Record Number: [id number] Patient Account Number: [id number] Date of Birth/Sex: [birth year] (75 y.o. F) Treating RN: Carlene Coria Primary Care Provider: [name] Other Clinician: Referring Provider: [name] Treating Provider/Extender: Skipper Cliche in Treatment: 28 Constitutional Obese and well-hydrated in no acute distress. Respiratory normal breathing without difficulty. Psychiatric this patient is able to make decisions and demonstrates good insight into disease process. Alert and Oriented x 3. pleasant and cooperative. Notes Upon inspection patient's wound bed showed signs of fairly good granulation epithelization at this point. Fortunately there does not appear to be any signs of active infection locally nor systemically at this point. I think again that were very close to complete resolution though there is still some work to do as far as getting things done here. Electronic Signature(s) Signed: 08/06/2021 1:54:13 PM By: Worthy Keeler PA-C Entered By: Worthy Keeler on 08/06/2021 13:54:12 [name] ([id number]) -------------------------------------------------------------------------------- Physician Orders Details Patient Name: [name] Date of Service: 08/06/2021 1:15 PM Medical Record Number: [id number] Patient Account Number: [id number] Date of Birth/Sex: [birth year] (75 y.o. F) Treating RN: Carlene Coria Primary Care Provider: [name] Other Clinician: Referring Provider: [name] Treating Provider/Extender: Skipper Cliche in Treatment: 28 Verbal / Phone  Orders: No Diagnosis Coding ICD-10 Coding Code Description I89.0 Lymphedema, not elsewhere classified I87.332 Chronic venous hypertension (idiopathic) with ulcer and inflammation of left lower extremity L97.828 Non-pressure chronic ulcer of other part of left lower leg with other specified severity Follow-up Appointments o Return Appointment in 1 week. Bathing/ Shower/ Hygiene o May shower with wound dressing protected with water repellent cover or cast protector. Edema Control - Lymphedema / Segmental Compressive Device / Other Left Lower Extremity o Optional: One layer of unna paste to top of compression wrap (to act as an anchor). o Tubigrip single layer applied. - RIGHT LEG tubi grip applied "E" until you get your compression sock use this and it's hand washable o Patient to wear own compression stockings. Remove compression stockings every night before going to bed and put on every morning when getting up. - on RIGHT LEG call [facility] for compression sock 15-22 mm/Hg, measurements and phone number on blue sheet [employer] on  [employer] in [place] also has the compression socks o Elevate, Exercise Daily and Avoid Standing for Long Periods of Time. o Elevate legs to the level of the heart and pump ankles as often as possible o Elevate leg(s) parallel to the floor when sitting. o Compression Pump: Use compression pump on left lower extremity for 60 minutes, twice daily. - You may pump over wraps o Compression Pump: Use compression pump on right lower extremity for 60 minutes, twice daily. - You may pump over wraps Additional Orders / Instructions Wound #2 Left,Lateral Lower Leg o Follow Nutritious Diet and Increase Protein Intake - Increase protein intake. Wound Treatment Wound #2 - Lower Leg Wound Laterality: Left, Lateral Cleanser: Soap and Water 1 x Per Week/30 Days Discharge Instructions: Gently cleanse wound with antibacterial soap, rinse  and pat dry prior to dressing wounds Primary Dressing: Hydrofera Blue Ready Transfer Foam, 2.5x2.5 (in/in) 1 x Per Week/30 Days Discharge Instructions: Apply Hydrofera Blue Ready to wound bed as directed Secondary Dressing: ABD Pad 5x9 (in/in) 1 x Per Week/30 Days Discharge Instructions: Cover with ABD pad Compression Wrap: Profore Lite LF 3 Multilayer Compression Bandaging System 1 x Per Week/30 Days Discharge Instructions: Apply 3 multi-layer wrap as prescribed. Electronic Signature(s) Signed: 08/06/2021 4:57:43 PM By: Worthy Keeler PA-C Signed: 08/07/2021 2:51:09 PM By: Carlene Coria RN Entered By: Carlene Coria on 08/06/2021 16:40:07 [name] ([id number]) -------------------------------------------------------------------------------- Problem List Details Patient Name: [name] Date of Service: 08/06/2021 1:15 PM Medical Record Number: [id number] Patient Account Number: [id number] Date of Birth/Sex: [birth year] (75 y.o. F) Treating RN: Carlene Coria Primary Care Provider: [name] Other Clinician: Referring Provider: [name] Treating Provider/Extender: Skipper Cliche in Treatment: 28 Active Problems ICD-10 Encounter Code Description Active Date MDM Diagnosis I89.0 Lymphedema, not elsewhere classified 01/16/2021 No Yes I87.332 Chronic venous hypertension (idiopathic) with ulcer and inflammation of 01/16/2021 No Yes left lower extremity L97.828 Non-pressure chronic ulcer of other part of left lower leg with other 01/16/2021 No Yes specified severity Inactive Problems Resolved Problems Electronic Signature(s) Signed: 08/06/2021 1:25:59 PM By: Worthy Keeler PA-C Entered By: Worthy Keeler on 08/06/2021 13:25:59 [name] ([id number]) -------------------------------------------------------------------------------- Progress Note Details Patient Name: [name] Date of Service: 08/06/2021 1:15 PM Medical Record Number: [id number] Patient  Account Number: [id number] Date of Birth/Sex: [birth year] (75 y.o. F) Treating RN: Carlene Coria Primary Care Provider: [name] Other Clinician: Referring Provider: [name] Treating Provider/Extender: Skipper Cliche in Treatment: 28 Subjective Chief Complaint Information obtained from Patient Left LE Ulcer History of Present Illness (HPI) 03/15/2020 upon evaluation today patient presents today for initial evaluation here in our clinic concerning a wound that is on the left posterior lower extremity. Unfortunately this has been giving the patient some discomfort at this point which she notes has been affected her pretty much daily. The patient does have a history of venous insufficiency, hypertension, congestive heart failure, and a history of breast cancer. Fortunately there does not appear to be evidence of active infection at this time which is great news. No fevers, chills, nausea, vomiting, or diarrhea. Wound is not extremely large but does have some slough covering the surface of the wound. This is going require sharp debridement today. 03/15/2020 upon evaluation today patient appears to be doing fairly well in regard to her wound. She does have some slough noted on the surface of the wound currently but I do believe the Iodoflex has been beneficial over the past week. There is no signs of active  infection at this time which is great news and overall very pleased with the progress. No fevers, chills, nausea, vomiting, or diarrhea. 04/02/2020 upon evaluation today patient appears to be doing a little better in regard to her wound size wise this is not tremendously smaller she does have some slough buildup on the surface of the wound. With that being said this is going require some sharp debridement today to clear away some of the necrotic debris. Fortunately there is no evidence of active infection at this time. No fevers, chills, nausea, vomiting, or diarrhea. 04/10/2020 on evaluation  today patient appears to be doing well with regard to her ulcer which is measuring somewhat smaller today. She actually has more granulation tissue noted which is also good news is no need for sharp debridement today. Fortunately there is no evidence of infection either which is also excellent. No fevers, chills, nausea, vomiting, or diarrhea. 04/26/2020 on evaluation today patient's wound actually is showing signs of good improvement which is great news. There does not appear to be any evidence of active infection. I do believe she is tolerating the collagen at this point. 05/03/2020 upon evaluation today patient's wound actually showed signs of good granulation at this time there does not appear to be any evidence of active infection which is great news and overall very pleased with where things stand. With that being said I do believe that the patient is can require some sharp debridement today but fortunately nothing too significant. 05/11/20 on evaluation today patient appears to be doing well in regard to her leg ulcer. She's been tolerating the dressing changes without complication. Fortunately there is no signs of active infection at this time. Overall I'm very pleased with where things stand. 05/18/2020 upon evaluation today patient's wound is showing signs of improvement is very dry and I think the collagen for the most part just dried to the wound bed. I am going to clean this away with sharp debridement in order to get this under control in my opinion. 05/25/2020 upon evaluation today patient actually appears to be doing well in regard to her leg ulcer. In fact upon inspection it appears she could potentially be completely healed but that is not guaranteed based on what we are seeing. There does not appear to be any signs of active infection which is great news. 05/31/2020 on evaluation today patient appears to be doing well with regard to her wounds. She in fact appears to be almost completely  healed on the left leg there is just a very small opening remaining point 1 06/08/2020 upon evaluation today patient appears to be doing excellent in regard to her leg ulcer on the left in fact it appears to be that she is completely healed as of today. I see no signs of active infection at this time which is great news. No fevers, chills, nausea, vomiting, or diarrhea. READMISSION 01/16/2021 Patient that we have had in this clinic with a wound on the left posterior calf in the summer 2021 extending into October. She was felt to have chronic venous insufficiency. Per the patient's description she was discharged with what sounds like external compression stockings although she could not afford the "$75 per leg". She therefore did not get anything. Apparently her wound that she has currently is been in existence since January. She has been followed at vein and vascular with Unna boots and I think calcium alginate. She had ABIs done on 06/25/2020 that showed an noncompressible ABI on the right  leg and the left leg but triphasic waveforms with good great toe pressures and a TBI of 0.90 on the right and 0.91 on the left Surprisingly the patient also had venous reflux studies that were really unremarkable. This included no evidence of DVTs or SVTs but there was no evidence of deep venous insufficiency or superficial venous insufficiency in the greater saphenous or short saphenous veins bilaterally. I would wonder about more central venous issues or perhaps this is all lymphedema 01/25/2021 upon evaluation today patient appears to be doing decently well and guard to her wound. The good news is the Iodoflex that is job she saw Dr. Dellia Nims last week for readmission and to be honest I think that this has done extremely well over that week. I do believe that she can tolerate the sharp debridement today which will be good. I think that cleaning off the wound will likely allow [place] to be able to use a different  type of dressing I do not think the Iodoflex will even be necessary based on how clean the wound looks. Fortunately there is no sign of active infection at this time which is great news. No fevers, chills, nausea, vomiting, or diarrhea. [name], [name] ([id number]) 02/12/2021 upon evaluation today patient appears to be doing well with regard to her leg ulcer. We did switch to alginate when she came for nurse visit I think is doing much better for her. Fortunately there does not appear to be any signs of active infection at this time which is great news. No fevers, chills, nausea, vomiting, or diarrhea. 02/18/2021 upon evaluation today patient's wound is actually showing signs of good improvement. I am very pleased with where things stand currently. I do not see any signs of active infection which is great news and overall I feel like she is making good progress. The patient likewise is happy that things are doing so well and that this is measuring somewhat smaller. 02/25/2021 upon evaluation today patient actually appears to be doing quite well in regard to her wounds. She has been tolerating the dressing changes without complication. Fortunately there does not appear to be any signs of active infection which is great news and overall I am extremely pleased with where things stand. No fevers, chills, nausea, vomiting, or diarrhea. She does have a lot of edema I really do think she needs compression socks to be worn in order to prevent things from causing additional issues for her currently. Especially on the right leg she should be wearing compression socks all the time to be honest. 03/04/2021 upon evaluation today patient's leg ulcer actually appears to be doing pretty well which is great news. There does not appear to be any significant change but overall the appearance is better even though the size is not necessarily reflecting a great improvement. Fortunately there does not appear to be any signs  of active infection. No fevers, chills, nausea, vomiting, or diarrhea. 03/12/2021 upon evaluation today patient appears to be doing well with regard to her wounds. She has been tolerating the dressing changes without complication. The good news is that the wound on her left lateral leg is doing much better and is showing signs of good improvement. Overall her swelling is controlled well as well. She does need some compression socks she plans to go Jefferey socks next week. 03/18/2021 on evaluation today patient's wound actually is showing signs of good improvement. She does have a little bit of slough this can require some sharp debridement today.  03/25/2021 upon evaluation today patient appears to be doing well with regard to her wound on the left lateral leg. She has been tolerating the dressing changes without complication. Fortunately there does not appear to be any signs of active infection at this time. No fever chills no 04/01/2021 upon evaluation today patient's wound is showing signs of improvement and overall very pleased with where things stand at this point. There is no evidence of active infection which is great news as well and in general I think that she is making great progress. 04/09/2021 upon evaluation today patient appears to be doing well with regard to her ankle ulcer. She is making good progress currently which is great news. There does not appear to be any signs of infection also excellent news. In general I am extremely pleased with where things stand today. No fevers, chills, nausea, vomiting, or diarrhea. 04/23/2021 upon inspection today patient appears to be doing quite well with regard to her leg ulcer. She has been tolerating the dressing changes without complication. She is can require some sharp debridement today but overall seems to be doing excellent. 05/06/2021 upon evaluation today patient unfortunately appears to be doing poorly overall in regard to her swelling. She is  actually significantly more swollen than last week despite the fact that her wound is doing better this is not a good trend. I think that she probably needs to see her cardiologist ASAP and I discussed that with her today. This includes the fact that honestly I think she probably is getting need an increase in her diuretic or something to try to clear away some of the excess fluid that she is experiencing at the moment. She is not been doing her pumps even twice a day but again right now more concerned that if she were to do the pump she would fluid overload her lungs causing other issues as far as her breathing is concerned. Obviously I think she needs to contact them and move up the appointment for the 12th of something sooner 2 weeks is a bit too long to wait with how swollen she has. 05/14/2021 upon evaluation today patient's wound actually showing signs of being a little bit smaller compared to previous. She has been making good progress however. Fortunately there does not appear to be any evidence of active infection which is great. Overall I am extremely pleased in that regard. Nonetheless I am still concerned that she is having quite a bit of issue as far as the swelling is concerned she has been placed on fluid restriction as well as an increase in the furosemide by her doctor. We will see how things progress. 05/21/2021 upon evaluation today patient appears to be doing well with regard to her wound in general. She has been tolerating the dressing changes without complication. With that being said she has been using silver cell for a while and while this was showing signs of improvement it is now gotten to the point where this has slowed down quite significantly. I do believe that switching to a different dressing may be beneficial for her today. 05/28/2021 upon evaluation today patient actually appears to be doing quite well in regard to her wound. In fact there is really not any need for sharp  debridement today based on what I see. The Hydrofera Blue is doing great and overall I think that she is managing quite nicely with the compression wrapping. Her edema is still down quite a bit with the wrapping in place.  06/11/2021 upon evaluation today patient appears to be doing well with regard to her leg ulcer. She has been tolerating the dressing changes without complication. Fortunately there does not appear to be any signs of active infection at this time which is great news. No fevers, chills, nausea, vomiting, or diarrhea. She is going require some sharp debridement today. 10/11; left leg ulcer much better looking and with improved surface area. She is using Hydrofera Blue under 3 layer compression. She does not have any stocking on the right leg which in itself has very significant nonpitting edema 06/25/2021 upon evaluation today patient appears to be doing well with regard to the wound on her left leg. She has been tolerating the dressing changes without complication and overall I am extremely pleased with where things stand today. I do not see any evidence of infection which is great news. 07/02/2021 upon evaluation today patient's wound actually showing signs of excellent improvement and actually very pleased with where we stand today and the overall appearance. Fortunately there does not appear to be any signs of active infection. No fevers, chills, nausea, vomiting, or diarrhea. 07/09/2021 upon evaluation today patient appears to be doing better in regard to her wound this is measuring smaller and she is headed in the appropriate direction based on what I am seeing currently. I do not see any evidence of active infection systemically which is great news. 07/16/2021 upon evaluation today patient appears to be doing pretty well currently in regard to her leg ulcer. This is measuring smaller and looking much better this is great news. Fortunately there does not appear to be any evidence of  active infection systemically which is great news [name], [name] ([id number]) as well. 07/23/2021 upon evaluation today patient's wound is actually showing signs of good improvement this is definitely measuring smaller. I do not see any signs of infection which is great news and overall very pleased in that regard. Overall I think that she is doing well with the [facility]. 07/30/2020 upon evaluation today patient appears to be doing well in regard to her wound. She has been tolerating the dressing changes without complication think the Hydrofera Blue at this point is actually causing her to dry out a lot as far as the wound bed is concerned. Subsequently I think that we need to try to see what we can do to improve this. Fortunately there does not appear to be any evidence of active infection at this time which is great news. 08/06/2021 upon evaluation today patient appears to be doing well with regards to her wound. Is not measuring significantly smaller initial inspection but upon closer inspection she has a lot of new skin growth across the central portion of the wound. I think will get very close to complete resolution. Objective Constitutional Obese and well-hydrated in no acute distress. Vitals Time Taken: 1:23 PM, Height: 69 in, Weight: 244 lbs, BMI: 36, Temperature: 98.2 F, Pulse: 81 bpm, Respiratory Rate: 18 breaths/min, Blood Pressure: 175/82 mmHg. Respiratory normal breathing without difficulty. Psychiatric this patient is able to make decisions and demonstrates good insight into disease process. Alert and Oriented x 3. pleasant and cooperative. General Notes: Upon inspection patient's wound bed showed signs of fairly good granulation epithelization at this point. Fortunately there does not appear to be any signs of active infection locally nor systemically at this point. I think again that were very close to complete resolution though there is still some work to do as far  as getting things  done here. Integumentary (Hair, Skin) Wound #2 status is Open. Original cause of wound was Gradually Appeared. The date acquired was: 09/08/2020. The wound has been in treatment 28 weeks. The wound is located on the Left,Lateral Lower Leg. The wound measures 0.6cm length x 0.4cm width x 0.1cm depth; 0.188cm^2 area and 0.019cm^3 volume. There is Fat Layer (Subcutaneous Tissue) exposed. There is no tunneling or undermining noted. There is a medium amount of serosanguineous drainage noted. There is large (67-100%) red granulation within the wound bed. There is no necrotic tissue within the wound bed. 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 There was a Three Layer Compression Therapy Procedure by Carlene Coria, RN. Post procedure Diagnosis Wound #2: Same as Pre-Procedure [name], [name] ([id number]) Plan Follow-up Appointments: Return Appointment in 1 week. Bathing/ Shower/ Hygiene: May shower with wound dressing protected with water repellent cover or cast protector. Edema Control - Lymphedema / Segmental Compressive Device / Other: Optional: One layer of unna paste to top of compression wrap (to act as an anchor). Tubigrip single layer applied. - RIGHT LEG tubi grip applied "E" until you get your compression sock use this and it's hand washable Patient to wear own compression stockings. Remove compression stockings every night before going to bed and put on every morning when getting up. - on RIGHT LEG call [facility] for compression sock 15-22 mm/Hg, measurements and phone number on blue sheet [employer] on [employer] in [place] also  has the compression socks Elevate, Exercise Daily and Avoid Standing for Long Periods of Time. Elevate legs to the level of the heart and pump ankles as often as possible Elevate leg(s) parallel to the floor when sitting. Compression Pump: Use compression pump on left lower extremity for 60 minutes, twice daily. - You may pump over wraps Compression Pump: Use compression pump on right lower extremity for 60 minutes, twice daily. - You may pump over wraps Additional Orders / Instructions: Wound #2 Left,Lateral Lower Leg: Follow Nutritious Diet and Increase Protein Intake - Increase protein intake. WOUND #2: - Lower Leg Wound Laterality: Left, Lateral Cleanser: Soap and Water 1 x Per Week/30 Days Discharge Instructions: Gently cleanse wound with antibacterial soap, rinse and pat dry prior to dressing wounds Primary Dressing: Prisma 4.34 (in) 1 x Per Week/30 Days Discharge Instructions: Moisten with hydrogel Secondary Dressing: ABD Pad 5x9 (in/in) 1 x Per Week/30 Days Discharge Instructions: Cover with ABD pad Compression Wrap: Profore Lite LF 3 Multilayer Compression Bandaging System 1 x Per Week/30 Days Discharge Instructions: Apply 3 multi-layer wrap as prescribed. 1. Would recommend him going to continue with the wound care measures as before and the patient is in agreement the plan. This includes the use of the [facility] dressing followed by the ABD pad to cover. 2. I am also can recommend that we have the patient continue with a 3 layer compression wrap which I think is doing a good job currently. We will see patient back for reevaluation in 1 week here in the clinic. If anything worsens or changes patient will contact our office for additional recommendations. Electronic Signature(s) Signed: 08/06/2021 2:05:36 PM By: Worthy Keeler PA-C Previous Signature: 08/06/2021 1:54:43 PM Version By: Worthy Keeler PA-C Entered  By: Worthy Keeler on 08/06/2021 14:05:36 [name]  ([id number]) -------------------------------------------------------------------------------- SuperBill Details Patient Name: [name] Date of Service: 08/06/2021 Medical Record Number: [id number] Patient Account Number: [id number] Date of Birth/Sex: 09/02/1946 (75 y.o. F) Treating RN: Carlene Coria Primary Care Provider: [name] Other Clinician: Referring Provider: [name] Treating Provider/Extender: Skipper Cliche in Treatment: 28 Diagnosis Coding ICD-10 Codes Code Description I89.0 Lymphedema, not elsewhere classified I87.332 Chronic venous hypertension (idiopathic) with ulcer and inflammation of left lower extremity L97.828 Non-pressure chronic ulcer of other part of left lower leg with other specified severity Facility Procedures CPT4 Code: [id number] Description: (Facility Use Only) 29581LT - [place] LWR LT LEG Modifier: Quantity: 1 Physician Procedures CPT4 Code Description: [id number] - WC PHYS LEVEL 3 - EST PT Modifier: Quantity: 1 CPT4 Code Description: ICD-10 Diagnosis Description I89.0 Lymphedema, not elsewhere classified I87.332 Chronic venous hypertension (idiopathic) with ulcer and inflammation of le L97.828 Non-pressure chronic ulcer of other part of left lower leg with  other spec Modifier: ft lower extremity ified severity Quantity: Electronic Signature(s) Signed: 08/06/2021 1:57:21 PM By: Worthy Keeler PA-C Entered By: Worthy Keeler on 08/06/2021 13:57:21

## 2021-08-07 NOTE — Progress Notes (Signed)
[name], [name] ([id number]) Visit Report for 08/06/2021 Arrival Information Details Patient Name: [name], [name] Date of Service: 08/06/2021 1:15 PM Medical Record Number: [id number] Patient Account Number: [id number] Date of Birth/Sex: [birth year] (75 y.o. F) Treating RN: Carlene Coria Primary Care Calbert Hulsebus: [name] Other Clinician: Referring Barbarita Hutmacher: [name] Treating Malayja Freund/Extender: Skipper Cliche in Treatment: 28 Visit Information History Since Last Visit All ordered tests and consults were completed: No Patient Arrived: Wheel Chair Added or deleted any medications: No Arrival Time: 13:16 Any new allergies or adverse reactions: No Accompanied By: self Had a fall or experienced change in No Transfer Assistance: None activities of daily living that may affect Patient Identification Verified: Yes risk of falls: Secondary Verification Process Completed: Yes Signs or symptoms of abuse/neglect since last visito No Patient Requires Transmission-Based Precautions: No Hospitalized since last visit: No Patient Has Alerts: Yes Implantable device outside of the clinic excluding No Patient Alerts: NOT DIABETIC cellular tissue based products placed in the center since last visit: Has Dressing in Place as Prescribed: Yes Has Compression in Place as Prescribed: Yes Pain Present Now: No Electronic Signature(s) Signed: 08/07/2021 2:51:09 PM By: Carlene Coria RN Entered By: Carlene Coria on 08/06/2021 13:22:58 [name] ([id number]) -------------------------------------------------------------------------------- Clinic Level of Care Assessment Details Patient Name: [name] Date of Service: 08/06/2021 1:15 PM Medical Record Number: [id number] Patient Account Number: [id number] Date of Birth/Sex: [birth year] (75 y.o. F) Treating RN: Carlene Coria Primary Care Jemiah Cuadra: [name] Other Clinician: Referring Cozy Veale: [name] Treating  Shabrea Weldin/Extender: Skipper Cliche in Treatment: 28 Clinic Level of Care Assessment Items TOOL 4 Quantity Score []  - Use when only an EandM is performed on FOLLOW-UP visit 0 ASSESSMENTS - Nursing Assessment / Reassessment []  - Reassessment of Co-morbidities (includes updates in patient status) 0 []  - 0 Reassessment of Adherence to Treatment Plan ASSESSMENTS - Wound and Skin Assessment / Reassessment []  - Simple Wound Assessment / Reassessment - one wound 0 []  - 0 Complex Wound Assessment / Reassessment - multiple wounds []  - 0 Dermatologic / Skin Assessment (not related to wound area) ASSESSMENTS - Focused Assessment []  - Circumferential Edema Measurements - multi extremities 0 []  - 0 Nutritional Assessment / Counseling / Intervention []  - 0 Lower Extremity Assessment (monofilament, tuning fork, pulses) []  - 0 Peripheral Arterial Disease Assessment (using hand held doppler) ASSESSMENTS - Ostomy and/or Continence Assessment and Care []  - Incontinence Assessment and Management 0 []  - 0 Ostomy Care Assessment and Management (repouching, etc.) PROCESS - Coordination of Care []  - Simple Patient / Family Education for ongoing care 0 []  - 0 Complex (extensive) Patient / Family Education for ongoing care []  - 0 Staff obtains Programmer, systems, Records, Test Results / Process Orders []  - 0 Staff telephones HHA, Nursing Homes / Clarify orders / etc []  - 0 Routine Transfer to another Facility (non-emergent condition) []  - 0 Routine Hospital Admission (non-emergent condition) []  - 0 New Admissions / Biomedical engineer / Ordering NPWT, Apligraf, etc. []  - 0 Emergency Hospital Admission (emergent condition) []  - 0 Simple Discharge Coordination []  - 0 Complex (extensive) Discharge Coordination PROCESS - Special Needs []  - Pediatric / Minor Patient Management 0 []  - 0 Isolation Patient Management []  - 0 Hearing / Language / Visual special needs []  - 0 Assessment of Community  assistance (transportation, D/C planning, etc.) []  - 0 Additional assistance / Altered mentation []  - 0 Support Surface(s) Assessment (bed, cushion, seat, etc.) INTERVENTIONS - Wound Cleansing / Measurement [name], [name] ([id number]) []  - 0 Simple  Wound Cleansing - one wound []  - 0 Complex Wound Cleansing - multiple wounds []  - 0 Wound Imaging (photographs - any number of wounds) []  - 0 Wound Tracing (instead of photographs) []  - 0 Simple Wound Measurement - one wound []  - 0 Complex Wound Measurement - multiple wounds INTERVENTIONS - Wound Dressings []  - Small Wound Dressing one or multiple wounds 0 []  - 0 Medium Wound Dressing one or multiple wounds []  - 0 Large Wound Dressing one or multiple wounds []  - 0 Application of Medications - topical []  - 0 Application of Medications - injection INTERVENTIONS - Miscellaneous []  - External ear exam 0 []  - 0 Specimen Collection (cultures, biopsies, blood, body fluids, etc.) []  - 0 Specimen(s) / Culture(s) sent or taken to Lab for analysis []  - 0 Patient Transfer (multiple staff / Civil Service fast streamer / Similar devices) []  - 0 Simple Staple / Suture removal (25 or less) []  - 0 Complex Staple / Suture removal (26 or more) []  - 0 Hypo / Hyperglycemic Management (close monitor of Blood Glucose) []  - 0 Ankle / Brachial Index (ABI) - do not check if billed separately []  - 0 Vital Signs Has the patient been seen at the hospital within the last three years: Yes Total Score: 0 Level Of Care: ____ Electronic Signature(s) Signed: 08/07/2021 2:51:09 PM By: Carlene Coria RN Entered By: Carlene Coria on 08/06/2021 13:44:02 [name] ([id number]) -------------------------------------------------------------------------------- Compression Therapy Details Patient Name: [name] Date of Service: 08/06/2021 1:15 PM Medical Record Number: [id number] Patient Account Number: [id number] Date of Birth/Sex: 10/15/1945 (75 y.o.  F) Treating RN: Carlene Coria Primary Care Talajah Slimp: [name] Other Clinician: Referring Darelle Kings: [name] Treating Candace Ramus/Extender: Skipper Cliche in Treatment: 28 Compression Therapy Performed for Wound Assessment: Wound #2 Left,Lateral Lower Leg Performed By: Clinician Carlene Coria, RN Compression Type: Three Layer Post Procedure Diagnosis Same as Pre-procedure Electronic Signature(s) Signed: 08/07/2021 2:51:09 PM By: Carlene Coria RN Entered By: Carlene Coria on 08/06/2021 13:42:22 [name], [name] ([id number]) -------------------------------------------------------------------------------- Encounter Discharge Information Details Patient Name: [name] Date of Service: 08/06/2021 1:15 PM Medical Record Number: [id number] Patient Account Number: [id number] Date of Birth/Sex: 11/06/1945 (75 y.o. F) Treating RN: Carlene Coria Primary Care Aliveah Gallant: [name] Other Clinician: Referring Dezeray Puccio: [name] Treating Gaby Harney/Extender: Skipper Cliche in Treatment: 28 Encounter Discharge Information Items Discharge Condition: Stable Ambulatory Status: Ambulatory Discharge Destination: Home Transportation: Private Auto Accompanied By: self Schedule Follow-up Appointment: Yes Clinical Summary of Care: Patient Declined Electronic Signature(s) Signed: 08/07/2021 2:51:09 PM By: Carlene Coria RN Entered By: Carlene Coria on 08/06/2021 13:45:38 [name] ([id number]) -------------------------------------------------------------------------------- Lower Extremity Assessment Details Patient Name: [name] Date of Service: 08/06/2021 1:15 PM Medical Record Number: [id number] Patient Account Number: [id number] Date of Birth/Sex: 05-Feb-1946 (75 y.o. F) Treating RN: Carlene Coria Primary Care Duke Weisensel: [name] Other Clinician: Referring Emmarose Klinke: [name] Treating Shanisha Lech/Extender: Skipper Cliche in Treatment: 28 Edema  Assessment Assessed: [Left: No] [Right: No] Edema: [Left: Ye] [Right: s] Calf Left: Right: Point of Measurement: 27 cm From Medial Instep 39 cm Ankle Left: Right: Point of Measurement: 10 cm From Medial Instep 25 cm Vascular Assessment Pulses: Dorsalis Pedis Palpable: [Left:Yes] Electronic Signature(s) Signed: 08/07/2021 2:51:09 PM By: Carlene Coria RN Entered By: Carlene Coria on 08/06/2021 13:31:45 [name] ([id number]) -------------------------------------------------------------------------------- Multi Wound Chart Details Patient Name: [name] Date of Service: 08/06/2021 1:15 PM Medical Record Number: [id number] Patient Account Number: [id number] Date of Birth/Sex: 12-19-1945 (75 y.o. F) Treating RN: Carlene Coria Primary Care Jonathon Tan:  [name] Other Clinician: Referring Genell Thede: [name] Treating Jeris Roser/Extender: Skipper Cliche in Treatment: 28 Vital Signs Height(in): 69 Pulse(bpm): 70 Weight(lbs): 244 Blood Pressure(mmHg): 175/82 Body Mass Index(BMI): 36 Temperature(F): 98.2 Respiratory Rate(breaths/min): 18 Photos: [N/A:N/A] Wound Location: Left, Lateral Lower Leg N/A N/A Wounding Event: Gradually Appeared N/A N/A Primary Etiology: Venous Leg Ulcer N/A N/A Comorbid History: Cataracts, Lymphedema, Sleep N/A N/A Apnea, Congestive Heart Failure, Hypertension, Osteoarthritis, Neuropathy, Received Chemotherapy, Received Radiation Date Acquired: 09/08/2020 N/A N/A Weeks of Treatment: 28 N/A N/A Wound Status: Open N/A N/A Measurements L x W x D (cm) 0.6x0.4x0.1 N/A N/A Area (cm) : 0.188 N/A N/A Volume (cm) : 0.019 N/A N/A % Reduction in Area: 96.00% N/A N/A % Reduction in Volume: 96.00% N/A N/A Classification: Full Thickness Without Exposed N/A N/A Support Structures Exudate Amount: Medium N/A N/A Exudate Type: Serosanguineous N/A N/A Exudate Color: red, brown N/A N/A Granulation Amount: Large (67-100%) N/A N/A Granulation  Quality: Red N/A N/A Necrotic Amount: None Present (0%) N/A N/A Exposed Structures: Fat Layer (Subcutaneous Tissue): N/A N/A Yes Fascia: No Tendon: No Muscle: No Joint: No Bone: No Epithelialization: Small (1-33%) N/A N/A Treatment Notes Electronic Signature(s) Signed: 08/07/2021 2:51:09 PM By: Carlene Coria RN Entered By: Carlene Coria on 08/06/2021 13:41:52 [name] ([id number]) -------------------------------------------------------------------------------- Multi-Disciplinary Care Plan Details Patient Name: [name] Date of Service: 08/06/2021 1:15 PM Medical Record Number: [id number] Patient Account Number: [id number] Date of Birth/Sex: 04-18-1946 (75 y.o. F) Treating RN: Carlene Coria Primary Care Rozella Servello: [name] Other Clinician: Referring Rosalita Carey: [name] Treating Enijah Furr/Extender: Skipper Cliche in Treatment: 28 Active Inactive Wound/Skin Impairment Nursing Diagnoses: Impaired tissue integrity Goals: Patient/caregiver will verbalize understanding of skin care regimen Date Initiated: 01/16/2021 Date Inactivated: 02/18/2021 Target Resolution Date: 02/16/2021 Goal Status: Met Ulcer/skin breakdown will have a volume reduction of 30% by week 4 Date Initiated: 01/16/2021 Date Inactivated: 02/18/2021 Target Resolution Date: 02/16/2021 Goal Status: Met Ulcer/skin breakdown will have a volume reduction of 50% by week 8 Date Initiated: 01/16/2021 Date Inactivated: 04/01/2021 Target Resolution Date: 03/18/2021 Goal Status: Unmet Unmet Reason: comorbities Ulcer/skin breakdown will have a volume reduction of 80% by week 12 Date Initiated: 04/01/2021 Date Inactivated: 04/23/2021 Target Resolution Date: 04/18/2021 Goal Status: Unmet Unmet Reason: comorbities Ulcer/skin breakdown will heal within 14 weeks Date Initiated: 04/01/2021 Target Resolution Date: 05/19/2021 Goal Status: Active Interventions: Assess ulceration(s) every visit Treatment  Activities: Topical wound management initiated : 01/16/2021 Notes: Electronic Signature(s) Signed: 08/07/2021 2:51:09 PM By: Carlene Coria RN Entered By: Carlene Coria on 08/06/2021 13:41:34 [name] ([id number]) -------------------------------------------------------------------------------- Pain Assessment Details Patient Name: [name] Date of Service: 08/06/2021 1:15 PM Medical Record Number: [id number] Patient Account Number: [id number] Date of Birth/Sex: [birth year] (75 y.o. F) Treating RN: Carlene Coria Primary Care Alonzo Loving: [name] Other Clinician: Referring Donzell Coller: [name] Treating Rochelle Nephew/Extender: Skipper Cliche in Treatment: 28 Active Problems Location of Pain Severity and Description of Pain Patient Has Paino No Site Locations Pain Management and Medication Current Pain Management: Electronic Signature(s) Signed: 08/07/2021 2:51:09 PM By: Carlene Coria RN Entered By: Carlene Coria on 08/06/2021 13:23:44 [name] ([id number]) -------------------------------------------------------------------------------- Patient/Caregiver Education Details Patient Name: [name] Date of Service: 08/06/2021 1:15 PM Medical Record Number: [id number] Patient Account Number: [id number] Date of Birth/Gender: 1945-10-29 (75 y.o. F) Treating RN: Carlene Coria Primary Care Physician: [name] Other Clinician: Referring Physician: [name] Treating Physician/Extender: Skipper Cliche in Treatment: 80 Education Assessment Education Provided To: Patient Education Topics Provided Wound/Skin Impairment: Methods: Explain/Verbal Responses: State content correctly  Electronic Signature(s) Signed: 08/07/2021 2:51:09 PM By: Carlene Coria RN Entered By: Carlene Coria on 08/06/2021 13:44:29 [name] ([id number]) -------------------------------------------------------------------------------- Wound Assessment Details Patient Name:  [name] Date of Service: 08/06/2021 1:15 PM Medical Record Number: [id number] Patient Account Number: [id number] Date of Birth/Sex: 1946/02/14 (75 y.o. F) Treating RN: Carlene Coria Primary Care Stacia Feazell: [name] Other Clinician: Referring Mikela Senn: [name] Treating Ayham Word/Extender: Skipper Cliche in Treatment: 28 Wound Status Wound Number: 2 Primary Venous Leg Ulcer Etiology: Wound Location: Left, Lateral Lower Leg Wound Open Wounding Event: Gradually Appeared Status: Date Acquired: 09/08/2020 Comorbid Cataracts, Lymphedema, Sleep Apnea, Congestive Heart Weeks Of Treatment: 28 History: Failure, Hypertension, Osteoarthritis, Neuropathy, Clustered Wound: No Received Chemotherapy, Received Radiation Photos Wound Measurements Length: (cm) 0.6 Width: (cm) 0.4 Depth: (cm) 0.1 Area: (cm) 0.188 Volume: (cm) 0.019 % Reduction in Area: 96% % Reduction in Volume: 96% Epithelialization: Small (1-33%) Tunneling: No Undermining: No Wound Description Classification: Full Thickness Without Exposed Support Structu Exudate Amount: Medium Exudate Type: Serosanguineous Exudate Color: red, brown res Foul Odor After Cleansing: No Slough/Fibrino No Wound Bed Granulation Amount: Large (67-100%) Exposed Structure Granulation Quality: Red Fascia Exposed: No Necrotic Amount: None Present (0%) Fat Layer (Subcutaneous Tissue) Exposed: Yes Tendon Exposed: No Muscle Exposed: No Joint Exposed: No Bone Exposed: No Treatment Notes Wound #2 (Lower Leg) Wound Laterality: Left, Lateral Cleanser Soap and Water Discharge Instruction: Gently cleanse wound with antibacterial soap, rinse and pat dry prior to dressing wounds Peri-Wound Care [name], [name] ([id number]) Topical Primary Dressing Prisma 4.34 (in) Discharge Instruction: Moisten with hydrogel Secondary Dressing ABD Pad 5x9 (in/in) Discharge Instruction: Cover with ABD pad Secured With Compression  Wrap Profore Lite LF 3 Multilayer Compression Bandaging System Discharge Instruction: Apply 3 multi-layer wrap as prescribed. Compression Stockings Add-Ons Electronic Signature(s) Signed: 08/07/2021 2:51:09 PM By: Carlene Coria RN Entered By: Carlene Coria on 08/06/2021 13:30:45 [name] ([id number]) -------------------------------------------------------------------------------- [place] Details Patient Name: [name] Date of Service: 08/06/2021 1:15 PM Medical Record Number: [id number] Patient Account Number: [id number] Date of Birth/Sex: 18-Jun-1946 (75 y.o. F) Treating RN: Carlene Coria Primary Care Olin Gurski: [name] Other Clinician: Referring Ireland Virrueta: [name] Treating Darrion Macaulay/Extender: Skipper Cliche in Treatment: 28 Vital Signs Time Taken: 13:23 Temperature (F): 98.2 Height (in): 69 Pulse (bpm): 81 Weight (lbs): 244 Respiratory Rate (breaths/min): 18 Body Mass Index (BMI): 36 Blood Pressure (mmHg): 175/82 Reference Range: 80 - 120 mg / dl Electronic Signature(s) Signed: 08/07/2021 2:51:09 PM By: Carlene Coria RN Entered By: Carlene Coria on 08/06/2021 13:23:22

## 2021-08-09 ENCOUNTER — Telehealth: Payer: Self-pay | Admitting: Oncology

## 2021-08-09 NOTE — Telephone Encounter (Signed)
Pt called in to see if she could get a earlier appt. She is having pain in both of her breast. She has appt on 12-16. Please give her a call at [phone]

## 2021-08-12 ENCOUNTER — Telehealth: Payer: Self-pay | Admitting: *Deleted

## 2021-08-12 NOTE — Telephone Encounter (Signed)
Patient called reporting that she has been having problems with her left breast for more than 3 weeks now. Her breast is "heavy and aching" and her nipple is not right. She states that she is on "a pill" for her right sided breast cancer.  She states her appointment is not until 12/16 and wants a earlier appointment. She did not get the voice mail left for her 12/2 regarding the appointment being moved up to 12/6 and states she uses transportation that needs a 3 day notice to pick her up. I spoke with Willeen Cass who will get her [place] Popper transportation so that she can keep her appointment tomorrow and she will call the daughters number which is where I contacted the patient.

## 2021-08-13 ENCOUNTER — Telehealth: Payer: Self-pay | Admitting: Oncology

## 2021-08-13 ENCOUNTER — Inpatient Hospital Stay: Payer: Medicare Other

## 2021-08-13 ENCOUNTER — Other Ambulatory Visit: Payer: Medicare Other

## 2021-08-13 ENCOUNTER — Encounter: Payer: Medicare Other | Attending: Physician Assistant | Admitting: Physician Assistant

## 2021-08-13 ENCOUNTER — Ambulatory Visit: Payer: Medicare Other | Admitting: Nurse Practitioner

## 2021-08-13 ENCOUNTER — Other Ambulatory Visit: Payer: Self-pay

## 2021-08-13 ENCOUNTER — Inpatient Hospital Stay: Payer: Medicare Other | Admitting: Hospice and Palliative Medicine

## 2021-08-13 DIAGNOSIS — L97828 Non-pressure chronic ulcer of other part of left lower leg with other specified severity: Secondary | ICD-10-CM | POA: Diagnosis not present

## 2021-08-13 DIAGNOSIS — I110 Hypertensive heart disease with heart failure: Secondary | ICD-10-CM | POA: Insufficient documentation

## 2021-08-13 DIAGNOSIS — I509 Heart failure, unspecified: Secondary | ICD-10-CM | POA: Insufficient documentation

## 2021-08-13 DIAGNOSIS — I890 Lymphedema, not elsewhere classified: Secondary | ICD-10-CM | POA: Diagnosis not present

## 2021-08-13 DIAGNOSIS — I87332 Chronic venous hypertension (idiopathic) with ulcer and inflammation of left lower extremity: Secondary | ICD-10-CM | POA: Insufficient documentation

## 2021-08-13 DIAGNOSIS — Z853 Personal history of malignant neoplasm of breast: Secondary | ICD-10-CM | POA: Diagnosis not present

## 2021-08-13 NOTE — Progress Notes (Addendum)
[name], [name] ([id number]) Visit Report for 08/13/2021 Chief Complaint Document Details Patient Name: [name], [name] Date of Service: 08/13/2021 2:00 PM Medical Record Number: [id number] Patient Account Number: [id number] Date of Birth/Sex: 04-09-1946 (75 y.o. F) Treating RN: Carlene Coria Primary Care Provider: [name] Other Clinician: Referring Provider: [name] Treating Provider/Extender: Skipper Cliche in Treatment: 29 Information Obtained from: Patient Chief Complaint Left LE Ulcer Electronic Signature(s) Signed: 08/13/2021 2:18:46 PM By: Worthy Keeler PA-C Entered By: Worthy Keeler on 08/13/2021 14:18:45 [name] ([id number]) -------------------------------------------------------------------------------- HPI Details Patient Name: [name] Date of Service: 08/13/2021 2:00 PM Medical Record Number: [id number] Patient Account Number: [id number] Date of Birth/Sex: [birth year] (75 y.o. F) Treating RN: Carlene Coria Primary Care Provider: [name] Other Clinician: Referring Provider: [name] Treating Provider/Extender: Skipper Cliche in Treatment: 29 History of Present Illness HPI Description: 03/15/2020 upon evaluation today patient presents today for initial evaluation here in our clinic concerning a wound that is on the left posterior lower extremity. Unfortunately this has been giving the patient some discomfort at this point which she notes has been affected her pretty much daily. The patient does have a history of venous insufficiency, hypertension, congestive heart failure, and a history of breast cancer. Fortunately there does not appear to be evidence of active infection at this time which is great news. No fevers, chills, nausea, vomiting, or diarrhea. Wound is not extremely large but does have some slough covering the surface of the wound. This is going require sharp debridement today. 03/15/2020 upon evaluation today patient  appears to be doing fairly well in regard to her wound. She does have some slough noted on the surface of the wound currently but I do believe the Iodoflex has been beneficial over the past week. There is no signs of active infection at this time which is great news and overall very pleased with the progress. No fevers, chills, nausea, vomiting, or diarrhea. 04/02/2020 upon evaluation today patient appears to be doing a little better in regard to her wound size wise this is not tremendously smaller she does have some slough buildup on the surface of the wound. With that being said this is going require some sharp debridement today to clear away some of the necrotic debris. Fortunately there is no evidence of active infection at this time. No fevers, chills, nausea, vomiting, or diarrhea. 04/10/2020 on evaluation today patient appears to be doing well with regard to her ulcer which is measuring somewhat smaller today. She actually has more granulation tissue noted which is also good news is no need for sharp debridement today. Fortunately there is no evidence of infection either which is also excellent. No fevers, chills, nausea, vomiting, or diarrhea. 04/26/2020 on evaluation today patient's wound actually is showing signs of good improvement which is great news. There does not appear to be any evidence of active infection. I do believe she is tolerating the collagen at this point. 05/03/2020 upon evaluation today patient's wound actually showed signs of good granulation at this time there does not appear to be any evidence of active infection which is great news and overall very pleased with where things stand. With that being said I do believe that the patient is can require some sharp debridement today but fortunately nothing too significant. 05/11/20 on evaluation today patient appears to be doing well in regard to her leg ulcer. She's been tolerating the dressing changes without complication.  Fortunately there is no signs of active infection at this time.  Overall I'm very pleased with where things stand. 05/18/2020 upon evaluation today patient's wound is showing signs of improvement is very dry and I think the collagen for the most part just dried to the wound bed. I am going to clean this away with sharp debridement in order to get this under control in my opinion. 05/25/2020 upon evaluation today patient actually appears to be doing well in regard to her leg ulcer. In fact upon inspection it appears she could potentially be completely healed but that is not guaranteed based on what we are seeing. There does not appear to be any signs of active infection which is great news. 05/31/2020 on evaluation today patient appears to be doing well with regard to her wounds. She in fact appears to be almost completely healed on the left leg there is just a very small opening remaining point 1 06/08/2020 upon evaluation today patient appears to be doing excellent in regard to her leg ulcer on the left in fact it appears to be that she is completely healed as of today. I see no signs of active infection at this time which is great news. No fevers, chills, nausea, vomiting, or diarrhea. READMISSION 01/16/2021 Patient that we have had in this clinic with a wound on the left posterior calf in the summer 2021 extending into October. She was felt to have chronic venous insufficiency. Per the patient's description she was discharged with what sounds like external compression stockings although she could not afford the "$75 per leg". She therefore did not get anything. Apparently her wound that she has currently is been in existence since January. She has been followed at vein and vascular with Unna boots and I think calcium alginate. She had ABIs done on 06/25/2020 that showed an noncompressible ABI on the right leg and the left leg but triphasic waveforms with good great toe pressures and a TBI of 0.90 on the  right and 0.91 on the left Surprisingly the patient also had venous reflux studies that were really unremarkable. This included no evidence of DVTs or SVTs but there was no evidence of deep venous insufficiency or superficial venous insufficiency in the greater saphenous or short saphenous veins bilaterally. I would wonder about more central venous issues or perhaps this is all lymphedema 01/25/2021 upon evaluation today patient appears to be doing decently well and guard to her wound. The good news is the Iodoflex that is job she saw Dr. Dellia Nims last week for readmission and to be honest I think that this has done extremely well over that week. I do believe that she can tolerate the sharp debridement today which will be good. I think that cleaning off the wound will likely allow [place] to be able to use a different type of dressing I do not think the Iodoflex will even be necessary based on how clean the wound looks. Fortunately there is no sign of active infection at this time which is great news. No fevers, chills, nausea, vomiting, or diarrhea. 02/12/2021 upon evaluation today patient appears to be doing well with regard to her leg ulcer. We did switch to alginate when she came for nurse visit I think is doing much better for her. Fortunately there does not appear to be any signs of active infection at this time which is great news. No fevers, chills, nausea, vomiting, or diarrhea. CENA, BRUHN ([id number]) 02/18/2021 upon evaluation today patient's wound is actually showing signs of good improvement. I am very pleased with where things stand  currently. I do not see any signs of active infection which is great news and overall I feel like she is making good progress. The patient likewise is happy that things are doing so well and that this is measuring somewhat smaller. 02/25/2021 upon evaluation today patient actually appears to be doing quite well in regard to her wounds. She has been tolerating the  dressing changes without complication. Fortunately there does not appear to be any signs of active infection which is great news and overall I am extremely pleased with where things stand. No fevers, chills, nausea, vomiting, or diarrhea. She does have a lot of edema I really do think she needs compression socks to be worn in order to prevent things from causing additional issues for her currently. Especially on the right leg she should be wearing compression socks all the time to be honest. 03/04/2021 upon evaluation today patient's leg ulcer actually appears to be doing pretty well which is great news. There does not appear to be any significant change but overall the appearance is better even though the size is not necessarily reflecting a great improvement. Fortunately there does not appear to be any signs of active infection. No fevers, chills, nausea, vomiting, or diarrhea. 03/12/2021 upon evaluation today patient appears to be doing well with regard to her wounds. She has been tolerating the dressing changes without complication. The good news is that the wound on her left lateral leg is doing much better and is showing signs of good improvement. Overall her swelling is controlled well as well. She does need some compression socks she plans to go Jefferey socks next week. 03/18/2021 on evaluation today patient's wound actually is showing signs of good improvement. She does have a little bit of slough this can require some sharp debridement today. 03/25/2021 upon evaluation today patient appears to be doing well with regard to her wound on the left lateral leg. She has been tolerating the dressing changes without complication. Fortunately there does not appear to be any signs of active infection at this time. No fever chills no 04/01/2021 upon evaluation today patient's wound is showing signs of improvement and overall very pleased with where things stand at this point. There is no evidence of active  infection which is great news as well and in general I think that she is making great progress. 04/09/2021 upon evaluation today patient appears to be doing well with regard to her ankle ulcer. She is making good progress currently which is great news. There does not appear to be any signs of infection also excellent news. In general I am extremely pleased with where things stand today. No fevers, chills, nausea, vomiting, or diarrhea. 04/23/2021 upon inspection today patient appears to be doing quite well with regard to her leg ulcer. She has been tolerating the dressing changes without complication. She is can require some sharp debridement today but overall seems to be doing excellent. 05/06/2021 upon evaluation today patient unfortunately appears to be doing poorly overall in regard to her swelling. She is actually significantly more swollen than last week despite the fact that her wound is doing better this is not a good trend. I think that she probably needs to see her cardiologist ASAP and I discussed that with her today. This includes the fact that honestly I think she probably is getting need an increase in her diuretic or something to try to clear away some of the excess fluid that she is experiencing at the moment. She is  not been doing her pumps even twice a day but again right now more concerned that if she were to do the pump she would fluid overload her lungs causing other issues as far as her breathing is concerned. Obviously I think she needs to contact them and move up the appointment for the 12th of something sooner 2 weeks is a bit too long to wait with how swollen she has. 05/14/2021 upon evaluation today patient's wound actually showing signs of being a little bit smaller compared to previous. She has been making good progress however. Fortunately there does not appear to be any evidence of active infection which is great. Overall I am extremely pleased in that regard. Nonetheless I am  still concerned that she is having quite a bit of issue as far as the swelling is concerned she has been placed on fluid restriction as well as an increase in the furosemide by her doctor. We will see how things progress. 05/21/2021 upon evaluation today patient appears to be doing well with regard to her wound in general. She has been tolerating the dressing changes without complication. With that being said she has been using silver cell for a while and while this was showing signs of improvement it is now gotten to the point where this has slowed down quite significantly. I do believe that switching to a different dressing may be beneficial for her today. 05/28/2021 upon evaluation today patient actually appears to be doing quite well in regard to her wound. In fact there is really not any need for sharp debridement today based on what I see. The Hydrofera Blue is doing great and overall I think that she is managing quite nicely with the compression wrapping. Her edema is still down quite a bit with the wrapping in place. 06/11/2021 upon evaluation today patient appears to be doing well with regard to her leg ulcer. She has been tolerating the dressing changes without complication. Fortunately there does not appear to be any signs of active infection at this time which is great news. No fevers, chills, nausea, vomiting, or diarrhea. She is going require some sharp debridement today. 10/11; left leg ulcer much better looking and with improved surface area. She is using Hydrofera Blue under 3 layer compression. She does not have any stocking on the right leg which in itself has very significant nonpitting edema 06/25/2021 upon evaluation today patient appears to be doing well with regard to the wound on her left leg. She has been tolerating the dressing changes without complication and overall I am extremely pleased with where things stand today. I do not see any evidence of infection which is great  news. 07/02/2021 upon evaluation today patient's wound actually showing signs of excellent improvement and actually very pleased with where we stand today and the overall appearance. Fortunately there does not appear to be any signs of active infection. No fevers, chills, nausea, vomiting, or diarrhea. 07/09/2021 upon evaluation today patient appears to be doing better in regard to her wound this is measuring smaller and she is headed in the appropriate direction based on what I am seeing currently. I do not see any evidence of active infection systemically which is great news. 07/16/2021 upon evaluation today patient appears to be doing pretty well currently in regard to her leg ulcer. This is measuring smaller and looking much better this is great news. Fortunately there does not appear to be any evidence of active infection systemically which is great news as well.  07/23/2021 upon evaluation today patient's wound is actually showing signs of good improvement this is definitely measuring smaller. I do not see any signs of infection which is great news and overall very pleased in that regard. Overall I think that she is doing well with the Telly Broberg, Bryant ([id number]) [place]. 07/30/2020 upon evaluation today patient appears to be doing well in regard to her wound. She has been tolerating the dressing changes without complication think the Hydrofera Blue at this point is actually causing her to dry out a lot as far as the wound bed is concerned. Subsequently I think that we need to try to see what we can do to improve this. Fortunately there does not appear to be any evidence of active infection at this time which is great news. 08/06/2021 upon evaluation today patient appears to be doing well with regards to her wound. Is not measuring significantly smaller initial inspection but upon closer inspection she has a lot of new skin growth across the central portion of the wound. I think will  get very close to complete resolution. 08/13/2021 upon evaluation today patient appears to be doing well with regard to her wound. In fact this appears to be I believe healed although there is still some question as to whether it is completely so. I am concerned about the fact that to be honest she still has some evidence of dry skin that could be just trapping something underneath I still want to debride anything as I am afraid of causing some damage to the good skin for that reason I Georgina Peer probably monitor for 1 more week before completely closing everything out. Electronic Signature(s) Signed: 08/13/2021 2:34:57 PM By: Worthy Keeler PA-C Entered By: Worthy Keeler on 08/13/2021 14:34:57 [name] ([id number]) -------------------------------------------------------------------------------- Physical Exam Details Patient Name: [name] Date of Service: 08/13/2021 2:00 PM Medical Record Number: [id number] Patient Account Number: [id number] Date of Birth/Sex: [birth year] (75 y.o. F) Treating RN: Carlene Coria Primary Care Provider: [name] Other Clinician: Referring Provider: [name] Treating Provider/Extender: Skipper Cliche in Treatment: 84 Constitutional Well-nourished and well-hydrated in no acute distress. Respiratory normal breathing without difficulty. Psychiatric this patient is able to make decisions and demonstrates good insight into disease process. Alert and Oriented x 3. pleasant and cooperative. Notes Upon inspection patient's wound bed actually showed signs of good granulation and epithelization at this point. Fortunately there is no signs of active infection locally nor systemically at this point. Overall I am extremely happy with where we stand currently. Electronic Signature(s) Signed: 08/13/2021 2:35:18 PM By: Worthy Keeler PA-C Entered By: Worthy Keeler on 08/13/2021 14:35:18 [name]  ([id number]) -------------------------------------------------------------------------------- Physician Orders Details Patient Name: [name] Date of Service: 08/13/2021 2:00 PM Medical Record Number: [id number] Patient Account Number: [id number] Date of Birth/Sex: 1945-09-12 (75 y.o. F) Treating RN: Carlene Coria Primary Care Provider: [name] Other Clinician: Referring Provider: [name] Treating Provider/Extender: Skipper Cliche in Treatment: 29 Verbal / Phone Orders: No Diagnosis Coding ICD-10 Coding Code Description I89.0 Lymphedema, not elsewhere classified I87.332 Chronic venous hypertension (idiopathic) with ulcer and inflammation of left lower extremity L97.828 Non-pressure chronic ulcer of other part of left lower leg with other specified severity Follow-up Appointments o Return Appointment in 1 week. Bathing/ Shower/ Hygiene o May shower with wound dressing protected with water repellent cover or cast protector. Edema Control - Lymphedema / Segmental Compressive Device / Other Left Lower Extremity o Optional: One layer of unna paste to top  of compression wrap (to act as an anchor). o Tubigrip single layer applied. - RIGHT LEG tubi grip applied "E" until you get your compression sock use this and it's hand washable o Patient to wear own compression stockings. Remove compression stockings every night before going to bed and put on every morning when getting up. - on RIGHT LEG call [facility] [facility] for compression sock 15-22 mm/Hg, measurements and phone number on blue sheet [employer] on [employer] in [place] also has the compression socks o Elevate, Exercise Daily and Avoid Standing for Long Periods of Time. o Elevate legs to the level of the heart and pump ankles as often as possible o Elevate leg(s) parallel to the floor when sitting. o Compression Pump: Use compression pump on left lower extremity for 60 minutes, twice daily. -  You may pump over wraps o Compression Pump: Use compression pump on right lower extremity for 60 minutes, twice daily. - You may pump over wraps Additional Orders / Instructions Wound #2 Left,Lateral Lower Leg o Follow Nutritious Diet and Increase Protein Intake - Increase protein intake. Wound Treatment Wound #2 - Lower Leg Wound Laterality: Left, Lateral Cleanser: Soap and Water 1 x Per Week/30 Days Discharge Instructions: Gently cleanse wound with antibacterial soap, rinse and pat dry prior to dressing wounds Primary Dressing: Hydrofera Blue Ready Transfer Foam, 2.5x2.5 (in/in) 1 x Per Week/30 Days Discharge Instructions: Apply Hydrofera Blue Ready to wound bed as directed Secondary Dressing: ABD Pad 5x9 (in/in) 1 x Per Week/30 Days Discharge Instructions: Cover with ABD pad Compression Wrap: Profore Lite LF 3 Multilayer Compression Bandaging System 1 x Per Week/30 Days Discharge Instructions: Apply 3 multi-layer wrap as prescribed. Electronic Signature(s) Signed: 08/13/2021 3:44:10 PM By: Carlene Coria RN Signed: 08/13/2021 4:19:41 PM By: Worthy Keeler PA-C Entered By: Carlene Coria on 08/13/2021 14:24:07 [name] ([id number]) -------------------------------------------------------------------------------- Problem List Details Patient Name: [name] Date of Service: 08/13/2021 2:00 PM Medical Record Number: [id number] Patient Account Number: [id number] Date of Birth/Sex: 03/15/1946 (75 y.o. F) Treating RN: Carlene Coria Primary Care Provider: [name] Other Clinician: Referring Provider: [name] Treating Provider/Extender: Skipper Cliche in Treatment: 29 Active Problems ICD-10 Encounter Code Description Active Date MDM Diagnosis I89.0 Lymphedema, not elsewhere classified 01/16/2021 No Yes I87.332 Chronic venous hypertension (idiopathic) with ulcer and inflammation of 01/16/2021 No Yes left lower extremity L97.828 Non-pressure chronic ulcer of other  part of left lower leg with other 01/16/2021 No Yes specified severity Inactive Problems Resolved Problems Electronic Signature(s) Signed: 08/13/2021 2:18:42 PM By: Worthy Keeler PA-C Entered By: Worthy Keeler on 08/13/2021 14:18:42 [name] ([id number]) -------------------------------------------------------------------------------- Progress Note Details Patient Name: [name] Date of Service: 08/13/2021 2:00 PM Medical Record Number: [id number] Patient Account Number: [id number] Date of Birth/Sex: Feb 22, 1946 (75 y.o. F) Treating RN: Carlene Coria Primary Care Provider: [name] Other Clinician: Referring Provider: [name] Treating Provider/Extender: Skipper Cliche in Treatment: 29 Subjective Chief Complaint Information obtained from Patient Left LE Ulcer History of Present Illness (HPI) 03/15/2020 upon evaluation today patient presents today for initial evaluation here in our clinic concerning a wound that is on the left posterior lower extremity. Unfortunately this has been giving the patient some discomfort at this point which she notes has been affected her pretty much daily. The patient does have a history of venous insufficiency, hypertension, congestive heart failure, and a history of breast cancer. Fortunately there does not appear to be evidence of active infection at this time which is great news.  No fevers, chills, nausea, vomiting, or diarrhea. Wound is not extremely large but does have some slough covering the surface of the wound. This is going require sharp debridement today. 03/15/2020 upon evaluation today patient appears to be doing fairly well in regard to her wound. She does have some slough noted on the surface of the wound currently but I do believe the Iodoflex has been beneficial over the past week. There is no signs of active infection at this time which is great news and overall very pleased with the progress. No fevers, chills, nausea,  vomiting, or diarrhea. 04/02/2020 upon evaluation today patient appears to be doing a little better in regard to her wound size wise this is not tremendously smaller she does have some slough buildup on the surface of the wound. With that being said this is going require some sharp debridement today to clear away some of the necrotic debris. Fortunately there is no evidence of active infection at this time. No fevers, chills, nausea, vomiting, or diarrhea. 04/10/2020 on evaluation today patient appears to be doing well with regard to her ulcer which is measuring somewhat smaller today. She actually has more granulation tissue noted which is also good news is no need for sharp debridement today. Fortunately there is no evidence of infection either which is also excellent. No fevers, chills, nausea, vomiting, or diarrhea. 04/26/2020 on evaluation today patient's wound actually is showing signs of good improvement which is great news. There does not appear to be any evidence of active infection. I do believe she is tolerating the collagen at this point. 05/03/2020 upon evaluation today patient's wound actually showed signs of good granulation at this time there does not appear to be any evidence of active infection which is great news and overall very pleased with where things stand. With that being said I do believe that the patient is can require some sharp debridement today but fortunately nothing too significant. 05/11/20 on evaluation today patient appears to be doing well in regard to her leg ulcer. She's been tolerating the dressing changes without complication. Fortunately there is no signs of active infection at this time. Overall I'm very pleased with where things stand. 05/18/2020 upon evaluation today patient's wound is showing signs of improvement is very dry and I think the collagen for the most part just dried to the wound bed. I am going to clean this away with sharp debridement in order to get  this under control in my opinion. 05/25/2020 upon evaluation today patient actually appears to be doing well in regard to her leg ulcer. In fact upon inspection it appears she could potentially be completely healed but that is not guaranteed based on what we are seeing. There does not appear to be any signs of active infection which is great news. 05/31/2020 on evaluation today patient appears to be doing well with regard to her wounds. She in fact appears to be almost completely healed on the left leg there is just a very small opening remaining point 1 06/08/2020 upon evaluation today patient appears to be doing excellent in regard to her leg ulcer on the left in fact it appears to be that she is completely healed as of today. I see no signs of active infection at this time which is great news. No fevers, chills, nausea, vomiting, or diarrhea. READMISSION 01/16/2021 Patient that we have had in this clinic with a wound on the left posterior calf in the summer 2021 extending into October. She was  felt to have chronic venous insufficiency. Per the patient's description she was discharged with what sounds like external compression stockings although she could not afford the "$75 per leg". She therefore did not get anything. Apparently her wound that she has currently is been in existence since January. She has been followed at vein and vascular with Unna boots and I think calcium alginate. She had ABIs done on 06/25/2020 that showed an noncompressible ABI on the right leg and the left leg but triphasic waveforms with good great toe pressures and a TBI of 0.90 on the right and 0.91 on the left Surprisingly the patient also had venous reflux studies that were really unremarkable. This included no evidence of DVTs or SVTs but there was no evidence of deep venous insufficiency or superficial venous insufficiency in the greater saphenous or short saphenous veins bilaterally. I would wonder about more central  venous issues or perhaps this is all lymphedema 01/25/2021 upon evaluation today patient appears to be doing decently well and guard to her wound. The good news is the Iodoflex that is job she saw Dr. Dellia Nims last week for readmission and to be honest I think that this has done extremely well over that week. I do believe that she can tolerate the sharp debridement today which will be good. I think that cleaning off the wound will likely allow [place] to be able to use a different type of dressing I do not think the Iodoflex will even be necessary based on how clean the wound looks. Fortunately there is no sign of active infection at this time which is great news. No fevers, chills, nausea, vomiting, or diarrhea. HALCYON, HECK ([id number]) 02/12/2021 upon evaluation today patient appears to be doing well with regard to her leg ulcer. We did switch to alginate when she came for nurse visit I think is doing much better for her. Fortunately there does not appear to be any signs of active infection at this time which is great news. No fevers, chills, nausea, vomiting, or diarrhea. 02/18/2021 upon evaluation today patient's wound is actually showing signs of good improvement. I am very pleased with where things stand currently. I do not see any signs of active infection which is great news and overall I feel like she is making good progress. The patient likewise is happy that things are doing so well and that this is measuring somewhat smaller. 02/25/2021 upon evaluation today patient actually appears to be doing quite well in regard to her wounds. She has been tolerating the dressing changes without complication. Fortunately there does not appear to be any signs of active infection which is great news and overall I am extremely pleased with where things stand. No fevers, chills, nausea, vomiting, or diarrhea. She does have a lot of edema I really do think she needs compression socks to be worn in order to prevent  things from causing additional issues for her currently. Especially on the right leg she should be wearing compression socks all the time to be honest. 03/04/2021 upon evaluation today patient's leg ulcer actually appears to be doing pretty well which is great news. There does not appear to be any significant change but overall the appearance is better even though the size is not necessarily reflecting a great improvement. Fortunately there does not appear to be any signs of active infection. No fevers, chills, nausea, vomiting, or diarrhea. 03/12/2021 upon evaluation today patient appears to be doing well with regard to her wounds. She has been tolerating  the dressing changes without complication. The good news is that the wound on her left lateral leg is doing much better and is showing signs of good improvement. Overall her swelling is controlled well as well. She does need some compression socks she plans to go Jefferey socks next week. 03/18/2021 on evaluation today patient's wound actually is showing signs of good improvement. She does have a little bit of slough this can require some sharp debridement today. 03/25/2021 upon evaluation today patient appears to be doing well with regard to her wound on the left lateral leg. She has been tolerating the dressing changes without complication. Fortunately there does not appear to be any signs of active infection at this time. No fever chills no 04/01/2021 upon evaluation today patient's wound is showing signs of improvement and overall very pleased with where things stand at this point. There is no evidence of active infection which is great news as well and in general I think that she is making great progress. 04/09/2021 upon evaluation today patient appears to be doing well with regard to her ankle ulcer. She is making good progress currently which is great news. There does not appear to be any signs of infection also excellent news. In general I am  extremely pleased with where things stand today. No fevers, chills, nausea, vomiting, or diarrhea. 04/23/2021 upon inspection today patient appears to be doing quite well with regard to her leg ulcer. She has been tolerating the dressing changes without complication. She is can require some sharp debridement today but overall seems to be doing excellent. 05/06/2021 upon evaluation today patient unfortunately appears to be doing poorly overall in regard to her swelling. She is actually significantly more swollen than last week despite the fact that her wound is doing better this is not a good trend. I think that she probably needs to see her cardiologist ASAP and I discussed that with her today. This includes the fact that honestly I think she probably is getting need an increase in her diuretic or something to try to clear away some of the excess fluid that she is experiencing at the moment. She is not been doing her pumps even twice a day but again right now more concerned that if she were to do the pump she would fluid overload her lungs causing other issues as far as her breathing is concerned. Obviously I think she needs to contact them and move up the appointment for the 12th of something sooner 2 weeks is a bit too long to wait with how swollen she has. 05/14/2021 upon evaluation today patient's wound actually showing signs of being a little bit smaller compared to previous. She has been making good progress however. Fortunately there does not appear to be any evidence of active infection which is great. Overall I am extremely pleased in that regard. Nonetheless I am still concerned that she is having quite a bit of issue as far as the swelling is concerned she has been placed on fluid restriction as well as an increase in the furosemide by her doctor. We will see how things progress. 05/21/2021 upon evaluation today patient appears to be doing well with regard to her wound in general. She has been  tolerating the dressing changes without complication. With that being said she has been using silver cell for a while and while this was showing signs of improvement it is now gotten to the point where this has slowed down quite significantly. I do believe that  switching to a different dressing may be beneficial for her today. 05/28/2021 upon evaluation today patient actually appears to be doing quite well in regard to her wound. In fact there is really not any need for sharp debridement today based on what I see. The Hydrofera Blue is doing great and overall I think that she is managing quite nicely with the compression wrapping. Her edema is still down quite a bit with the wrapping in place. 06/11/2021 upon evaluation today patient appears to be doing well with regard to her leg ulcer. She has been tolerating the dressing changes without complication. Fortunately there does not appear to be any signs of active infection at this time which is great news. No fevers, chills, nausea, vomiting, or diarrhea. She is going require some sharp debridement today. 10/11; left leg ulcer much better looking and with improved surface area. She is using Hydrofera Blue under 3 layer compression. She does not have any stocking on the right leg which in itself has very significant nonpitting edema 06/25/2021 upon evaluation today patient appears to be doing well with regard to the wound on her left leg. She has been tolerating the dressing changes without complication and overall I am extremely pleased with where things stand today. I do not see any evidence of infection which is great news. 07/02/2021 upon evaluation today patient's wound actually showing signs of excellent improvement and actually very pleased with where we stand today and the overall appearance. Fortunately there does not appear to be any signs of active infection. No fevers, chills, nausea, vomiting, or diarrhea. 07/09/2021 upon evaluation today  patient appears to be doing better in regard to her wound this is measuring smaller and she is headed in the appropriate direction based on what I am seeing currently. I do not see any evidence of active infection systemically which is great news. 07/16/2021 upon evaluation today patient appears to be doing pretty well currently in regard to her leg ulcer. This is measuring smaller and looking much better this is great news. Fortunately there does not appear to be any evidence of active infection systemically which is great news KODIE, PICK ([id number]) as well. 07/23/2021 upon evaluation today patient's wound is actually showing signs of good improvement this is definitely measuring smaller. I do not see any signs of infection which is great news and overall very pleased in that regard. Overall I think that she is doing well with the [facility]. 07/30/2020 upon evaluation today patient appears to be doing well in regard to her wound. She has been tolerating the dressing changes without complication think the Hydrofera Blue at this point is actually causing her to dry out a lot as far as the wound bed is concerned. Subsequently I think that we need to try to see what we can do to improve this. Fortunately there does not appear to be any evidence of active infection at this time which is great news. 08/06/2021 upon evaluation today patient appears to be doing well with regards to her wound. Is not measuring significantly smaller initial inspection but upon closer inspection she has a lot of new skin growth across the central portion of the wound. I think will get very close to complete resolution. 08/13/2021 upon evaluation today patient appears to be doing well with regard to her wound. In fact this appears to be I believe healed although there is still some question as to whether it is completely so. I am concerned about the fact that to  be honest she still has some evidence of dry skin  that could be just trapping something underneath I still want to debride anything as I am afraid of causing some damage to the good skin for that reason I Georgina Peer probably monitor for 1 more week before completely closing everything out. Objective Constitutional Well-nourished and well-hydrated in no acute distress. Vitals Time Taken: 2:08 PM, Height: 69 in, Weight: 244 lbs, BMI: 36, Temperature: 97.6 F, Pulse: 63 bpm, Respiratory Rate: 18 breaths/min, Blood Pressure: 152/71 mmHg. Respiratory normal breathing without difficulty. Psychiatric this patient is able to make decisions and demonstrates good insight into disease process. Alert and Oriented x 3. pleasant and cooperative. General Notes: Upon inspection patient's wound bed actually showed signs of good granulation and epithelization at this point. Fortunately there is no signs of active infection locally nor systemically at this point. Overall I am extremely happy with where we stand currently. Integumentary (Hair, Skin) Wound #2 status is Open. Original cause of wound was Gradually Appeared. The date acquired was: 09/08/2020. The wound has been in treatment 29 weeks. The wound is located on the Left,Lateral Lower Leg. The wound measures 0.1cm length x 0.1cm width x 0.1cm depth; 0.008cm^2 area and 0.001cm^3 volume. There is Fat Layer (Subcutaneous Tissue) exposed. There is no tunneling or undermining noted. There is a none present amount of drainage noted. There is large (67-100%) granulation within the wound bed. There is no necrotic tissue within the wound bed. 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 There was  a Three Layer Compression Therapy Procedure by Carlene Coria, RN. Anderson, Rielynn ([id number]) Post procedure Diagnosis Wound #2: Same as Pre-Procedure Plan Follow-up Appointments: Return Appointment in 1 week. Bathing/ Shower/ Hygiene: May shower with wound dressing protected with water repellent cover or cast protector. Edema Control - Lymphedema / Segmental Compressive Device / Other: Optional: One layer of unna paste to top of compression wrap (to act as an anchor). Tubigrip single layer applied. - RIGHT LEG tubi grip applied "E" until you get your compression sock use this and it's hand washable Patient to wear own compression stockings. Remove compression stockings every night before going to bed and put on every morning when getting up. - on RIGHT LEG call [facility] for compression sock 15-22 mm/Hg, measurements and phone number on blue sheet [employer] on [employer] in [place] also has the compression socks Elevate, Exercise Daily and Avoid Standing for Long Periods of Time. Elevate legs to the level of the heart and pump ankles as often as possible Elevate leg(s) parallel to the floor when sitting. Compression Pump: Use compression pump on left lower extremity for 60 minutes, twice daily. - You may pump over wraps Compression Pump: Use compression pump on right lower extremity for 60 minutes, twice daily. - You may pump over wraps Additional Orders / Instructions: Wound #2 Left,Lateral Lower Leg: Follow Nutritious Diet and Increase Protein Intake - Increase protein intake. WOUND #2: - Lower Leg Wound Laterality: Left, Lateral Cleanser: Soap and Water 1 x Per Week/30 Days Discharge Instructions: Gently cleanse wound with antibacterial soap, rinse and pat dry prior to dressing wounds Primary Dressing: Hydrofera Blue Ready Transfer Foam, 2.5x2.5 (in/in) 1 x Per Week/30 Days Discharge  Instructions: Apply Hydrofera Blue Ready to wound bed as directed Secondary  Dressing: ABD Pad 5x9 (in/in) 1 x Per Week/30 Days Discharge Instructions: Cover with ABD pad Compression Wrap: Profore Lite LF 3 Multilayer Compression Bandaging System 1 x Per Week/30 Days Discharge Instructions: Apply 3 multi-layer wrap as prescribed. 1. We could potentially be completely healed but I want to give this just a little bit more time and wrap especially since I am not sure she can have anything at all to use for compression when she gets out of here. Subsequently she unfortunately does not have anyone to really help her at home which has been to be the biggest issue. 2. I am also can recommend at this time that we have the patient continue to monitor for any signs of worsening or infection if anything changes she should let me know as soon as possible. We will continue with the compression wrap for this week and if she still closed as of next week we will discharge her Foley at that time. We will see patient back for reevaluation in 1 week here in the clinic. If anything worsens or changes patient will contact our office for additional recommendations. Electronic Signature(s) Signed: 08/13/2021 2:36:09 PM By: Worthy Keeler PA-C Entered By: Worthy Keeler on 08/13/2021 14:36:09 [name] ([id number]) -------------------------------------------------------------------------------- SuperBill Details Patient Name: [name] Date of Service: 08/13/2021 Medical Record Number: [id number] Patient Account Number: [id number] Date of Birth/Sex: [birth year] (75 y.o. F) Treating RN: Carlene Coria Primary Care Provider: [name] Other Clinician: Referring Provider: [name] Treating Provider/Extender: Skipper Cliche in Treatment: 29 Diagnosis Coding ICD-10 Codes Code Description I89.0 Lymphedema, not elsewhere classified I87.332 Chronic venous hypertension (idiopathic) with ulcer and inflammation of left lower extremity L97.828 Non-pressure chronic ulcer of  other part of left lower leg with other specified severity Facility Procedures CPT4 Code: [id number] Description: (Facility Use Only) 29581LT - [place] LWR LT LEG Modifier: Quantity: 1 Physician Procedures CPT4 Code Description: [id number] - WC PHYS LEVEL 3 - EST PT Modifier: Quantity: 1 CPT4 Code Description: ICD-10 Diagnosis Description I89.0 Lymphedema, not elsewhere classified I87.332 Chronic venous hypertension (idiopathic) with ulcer and inflammation of le L97.828 Non-pressure chronic ulcer of other part of left lower leg with  other spec Modifier: ft lower extremity ified severity Quantity: Electronic Signature(s) Signed: 08/13/2021 2:36:33 PM By: Worthy Keeler PA-C Entered By: Worthy Keeler on 08/13/2021 14:36:32

## 2021-08-13 NOTE — Progress Notes (Signed)
[name], [name] ([id number]) Visit Report for 08/13/2021 Arrival Information Details Patient Name: [name] Date of Service: 08/13/2021 2:00 PM Medical Record Number: [id number] Patient Account Number: [id number] Date of Birth/Sex: 1946-07-20 (75 y.o. F) Treating RN: Carlene Coria Primary Care Najib Colmenares: [name] Other Clinician: Referring Talton Delpriore: [name] Treating Kerensa Nicklas/Extender: Skipper Cliche in Treatment: 29 Visit Information History Since Last Visit All ordered tests and consults were completed: No Patient Arrived: Wheel Chair Added or deleted any medications: No Arrival Time: 14:01 Any new allergies or adverse reactions: No Accompanied By: self Had a fall or experienced change in No Transfer Assistance: None activities of daily living that may affect Patient Identification Verified: Yes risk of falls: Secondary Verification Process Completed: Yes Signs or symptoms of abuse/neglect since last visito No Patient Requires Transmission-Based Precautions: No Hospitalized since last visit: No Patient Has Alerts: Yes Implantable device outside of the clinic excluding No Patient Alerts: NOT DIABETIC cellular tissue based products placed in the center since last visit: Has Dressing in Place as Prescribed: Yes Has Compression in Place as Prescribed: Yes Pain Present Now: No Electronic Signature(s) Signed: 08/13/2021 3:44:10 PM By: Carlene Coria RN Entered By: Carlene Coria on 08/13/2021 14:08:14 [name] ([id number]) -------------------------------------------------------------------------------- Clinic Level of Care Assessment Details Patient Name: [name] Date of Service: 08/13/2021 2:00 PM Medical Record Number: [id number] Patient Account Number: [id number] Date of Birth/Sex: Aug 12, 1946 (75 y.o. F) Treating RN: Carlene Coria Primary Care Lakera Viall: [name] Other Clinician: Referring Frans Valente: [name] Treating Denni France/Extender:  Skipper Cliche in Treatment: 29 Clinic Level of Care Assessment Items TOOL 4 Quantity Score []  - Use when only an EandM is performed on FOLLOW-UP visit 0 ASSESSMENTS - Nursing Assessment / Reassessment []  - Reassessment of Co-morbidities (includes updates in patient status) 0 []  - 0 Reassessment of Adherence to Treatment Plan ASSESSMENTS - Wound and Skin Assessment / Reassessment []  - Simple Wound Assessment / Reassessment - one wound 0 []  - 0 Complex Wound Assessment / Reassessment - multiple wounds []  - 0 Dermatologic / Skin Assessment (not related to wound area) ASSESSMENTS - Focused Assessment []  - Circumferential Edema Measurements - multi extremities 0 []  - 0 Nutritional Assessment / Counseling / Intervention []  - 0 Lower Extremity Assessment (monofilament, tuning fork, pulses) []  - 0 Peripheral Arterial Disease Assessment (using hand held doppler) ASSESSMENTS - Ostomy and/or Continence Assessment and Care []  - Incontinence Assessment and Management 0 []  - 0 Ostomy Care Assessment and Management (repouching, etc.) PROCESS - Coordination of Care []  - Simple Patient / Family Education for ongoing care 0 []  - 0 Complex (extensive) Patient / Family Education for ongoing care []  - 0 Staff obtains Programmer, systems, Records, Test Results / Process Orders []  - 0 Staff telephones HHA, Nursing Homes / Clarify orders / etc []  - 0 Routine Transfer to another Facility (non-emergent condition) []  - 0 Routine Hospital Admission (non-emergent condition) []  - 0 New Admissions / Biomedical engineer / Ordering NPWT, Apligraf, etc. []  - 0 Emergency Hospital Admission (emergent condition) []  - 0 Simple Discharge Coordination []  - 0 Complex (extensive) Discharge Coordination PROCESS - Special Needs []  - Pediatric / Minor Patient Management 0 []  - 0 Isolation Patient Management []  - 0 Hearing / Language / Visual special needs []  - 0 Assessment of Community assistance  (transportation, D/C planning, etc.) []  - 0 Additional assistance / Altered mentation []  - 0 Support Surface(s) Assessment (bed, cushion, seat, etc.) INTERVENTIONS - Wound Cleansing / Measurement [name] ([id number]) []  - 0 Simple  Wound Cleansing - one wound []  - 0 Complex Wound Cleansing - multiple wounds []  - 0 Wound Imaging (photographs - any number of wounds) []  - 0 Wound Tracing (instead of photographs) []  - 0 Simple Wound Measurement - one wound []  - 0 Complex Wound Measurement - multiple wounds INTERVENTIONS - Wound Dressings []  - Small Wound Dressing one or multiple wounds 0 []  - 0 Medium Wound Dressing one or multiple wounds []  - 0 Large Wound Dressing one or multiple wounds []  - 0 Application of Medications - topical []  - 0 Application of Medications - injection INTERVENTIONS - Miscellaneous []  - External ear exam 0 []  - 0 Specimen Collection (cultures, biopsies, blood, body fluids, etc.) []  - 0 Specimen(s) / Culture(s) sent or taken to Lab for analysis []  - 0 Patient Transfer (multiple staff / Civil Service fast streamer / Similar devices) []  - 0 Simple Staple / Suture removal (25 or less) []  - 0 Complex Staple / Suture removal (26 or more) []  - 0 Hypo / Hyperglycemic Management (close monitor of Blood Glucose) []  - 0 Ankle / Brachial Index (ABI) - do not check if billed separately []  - 0 Vital Signs Has the patient been seen at the hospital within the last three years: Yes Total Score: 0 Level Of Care: ____ Electronic Signature(s) Signed: 08/13/2021 3:44:10 PM By: Carlene Coria RN Entered By: Carlene Coria on 08/13/2021 14:24:46 [name] ([id number]) -------------------------------------------------------------------------------- Compression Therapy Details Patient Name: [name] Date of Service: 08/13/2021 2:00 PM Medical Record Number: [id number] Patient Account Number: [id number] Date of Birth/Sex: 1946-04-04 (75 y.o. F) Treating RN: Carlene Coria Primary Care Suhey Radford: [name] Other Clinician: Referring Clester Chlebowski: [name] Treating Neko Mcgeehan/Extender: Skipper Cliche in Treatment: 29 Compression Therapy Performed for Wound Assessment: Wound #2 Left,Lateral Lower Leg Performed By: Clinician Carlene Coria, RN Compression Type: Three Layer Post Procedure Diagnosis Same as Pre-procedure Electronic Signature(s) Signed: 08/13/2021 3:44:10 PM By: Carlene Coria RN Entered By: Carlene Coria on 08/13/2021 14:24:31 [name] ([id number]) -------------------------------------------------------------------------------- Encounter Discharge Information Details Patient Name: [name] Date of Service: 08/13/2021 2:00 PM Medical Record Number: [id number] Patient Account Number: [id number] Date of Birth/Sex: 1946-04-16 (75 y.o. F) Treating RN: Carlene Coria Primary Care Nyjae Hodge: [name] Other Clinician: Referring Lucila Klecka: [name] Treating Gowri Suchan/Extender: Skipper Cliche in Treatment: 29 Encounter Discharge Information Items Discharge Condition: Stable Ambulatory Status: Wheelchair Discharge Destination: Home Transportation: Private Auto Accompanied By: self Schedule Follow-up Appointment: Yes Clinical Summary of Care: Patient Declined Electronic Signature(s) Signed: 08/13/2021 3:44:10 PM By: Carlene Coria RN Entered By: Carlene Coria on 08/13/2021 14:26:15 [name] ([id number]) -------------------------------------------------------------------------------- Lower Extremity Assessment Details Patient Name: [name] Date of Service: 08/13/2021 2:00 PM Medical Record Number: [id number] Patient Account Number: [id number] Date of Birth/Sex: [birth year] (75 y.o. F) Treating RN: Carlene Coria Primary Care Doyne Ellinger: [name] Other Clinician: Referring Angel Weedon: [name] Treating Annalaya Wile/Extender: Skipper Cliche in Treatment: 29 Edema Assessment Assessed: [Left: No] [Right:  No] Edema: [Left: Ye] [Right: s] Calf Left: Right: Point of Measurement: 27 cm From Medial Instep 39 cm Ankle Left: Right: Point of Measurement: 10 cm From Medial Instep 25 cm Vascular Assessment Pulses: Dorsalis Pedis Palpable: [Left:Yes] Electronic Signature(s) Signed: 08/13/2021 3:44:10 PM By: Carlene Coria RN Entered By: Carlene Coria on 08/13/2021 14:14:29 [name] ([id number]) -------------------------------------------------------------------------------- Multi Wound Chart Details Patient Name: [name] Date of Service: 08/13/2021 2:00 PM Medical Record Number: [id number] Patient Account Number: [id number] Date of Birth/Sex: [birth year] (75 y.o. F) Treating RN: Carlene Coria Primary Care Steffani Dionisio:  [name] Other Clinician: Referring Tran Arzuaga: [name] Treating Tramaine Snell/Extender: Skipper Cliche in Treatment: 29 Vital Signs Height(in): 69 Pulse(bpm): 63 Weight(lbs): 244 Blood Pressure(mmHg): 152/71 Body Mass Index(BMI): 36 Temperature(F): 97.6 Respiratory Rate(breaths/min): 18 Photos: [N/A:N/A] Wound Location: Left, Lateral Lower Leg N/A N/A Wounding Event: Gradually Appeared N/A N/A Primary Etiology: Venous Leg Ulcer N/A N/A Comorbid History: Cataracts, Lymphedema, Sleep N/A N/A Apnea, Congestive Heart Failure, Hypertension, Osteoarthritis, Neuropathy, Received Chemotherapy, Received Radiation Date Acquired: 09/08/2020 N/A N/A Weeks of Treatment: 29 N/A N/A Wound Status: Open N/A N/A Measurements L x W x D (cm) 0x0x0 N/A N/A Area (cm) : 0 N/A N/A Volume (cm) : 0 N/A N/A % Reduction in Area: 100.00% N/A N/A % Reduction in Volume: 100.00% N/A N/A Classification: Full Thickness Without Exposed N/A N/A Support Structures Exudate Amount: None Present N/A N/A Granulation Amount: None Present (0%) N/A N/A Necrotic Amount: None Present (0%) N/A N/A Exposed Structures: Fascia: No N/A N/A Fat Layer (Subcutaneous Tissue): No Tendon:  No Muscle: No Joint: No Bone: No Epithelialization: None N/A N/A Treatment Notes Electronic Signature(s) Signed: 08/13/2021 3:44:10 PM By: Carlene Coria RN Entered By: Carlene Coria on 08/13/2021 14:20:06 [name] ([id number]) -------------------------------------------------------------------------------- Multi-Disciplinary Care Plan Details Patient Name: [name] Date of Service: 08/13/2021 2:00 PM Medical Record Number: [id number] Patient Account Number: [id number] Date of Birth/Sex: [birth year] (75 y.o. F) Treating RN: Carlene Coria Primary Care Glee Lashomb: [name] Other Clinician: Referring Meiya Wisler: [name] Treating Farhan Jean/Extender: Skipper Cliche in Treatment: 29 Active Inactive Electronic Signature(s) Signed: 08/13/2021 3:44:10 PM By: Carlene Coria RN Entered By: Carlene Coria on 08/13/2021 14:19:55 [name] ([id number]) -------------------------------------------------------------------------------- Pain Assessment Details Patient Name: [name] Date of Service: 08/13/2021 2:00 PM Medical Record Number: [id number] Patient Account Number: [id number] Date of Birth/Sex: 1946-02-09 (75 y.o. F) Treating RN: Carlene Coria Primary Care Dalonte Hardage: [name] Other Clinician: Referring Harel Repetto: [name] Treating Saul Fabiano/Extender: Skipper Cliche in Treatment: 29 Active Problems Location of Pain Severity and Description of Pain Patient Has Paino No Site Locations Pain Management and Medication Current Pain Management: Electronic Signature(s) Signed: 08/13/2021 3:44:10 PM By: Carlene Coria RN Entered By: Carlene Coria on 08/13/2021 14:09:16 [name] ([id number]) -------------------------------------------------------------------------------- Patient/Caregiver Education Details Patient Name: [name] Date of Service: 08/13/2021 2:00 PM Medical Record Number: [id number] Patient Account Number: [id number] Date of  Birth/Gender: 1946-07-06 (75 y.o. F) Treating RN: Carlene Coria Primary Care Physician: [name] Other Clinician: Referring Physician: [name] Treating Physician/Extender: Skipper Cliche in Treatment: 2 Education Assessment Education Provided To: Patient Education Topics Provided Wound/Skin Impairment: Methods: Explain/Verbal Responses: State content correctly Electronic Signature(s) Signed: 08/13/2021 3:44:10 PM By: Carlene Coria RN Entered By: Carlene Coria on 08/13/2021 14:25:18 [name] ([id number]) -------------------------------------------------------------------------------- Wound Assessment Details Patient Name: [name] Date of Service: 08/13/2021 2:00 PM Medical Record Number: [id number] Patient Account Number: [id number] Date of Birth/Sex: Mar 21, 1946 (75 y.o. F) Treating RN: Carlene Coria Primary Care Zafir Schauer: [name] Other Clinician: Referring Estephan Gallardo: [name] Treating Raymie Trani/Extender: Skipper Cliche in Treatment: 29 Wound Status Wound Number: 2 Primary Venous Leg Ulcer Etiology: Wound Location: Left, Lateral Lower Leg Wound Open Wounding Event: Gradually Appeared Status: Date Acquired: 09/08/2020 Comorbid Cataracts, Lymphedema, Sleep Apnea, Congestive Heart Weeks Of Treatment: 29 History: Failure, Hypertension, Osteoarthritis, Neuropathy, Clustered Wound: No Received Chemotherapy, Received Radiation Photos Wound Measurements Length: (cm) 0.1 Width: (cm) 0.1 Depth: (cm) 0.1 Area: (cm) 0.008 Volume: (cm) 0.001 % Reduction in Area: 99.8% % Reduction in Volume: 99.8% Epithelialization: Large (67-100%) Tunneling: No Undermining: No  Wound Description Classification: Full Thickness Without Exposed Support Structures Exudate Amount: None Present Foul Odor After Cleansing: No Slough/Fibrino No Wound Bed Granulation Amount: Large (67-100%) Exposed Structure Necrotic Amount: None Present (0%) Fascia Exposed: No Fat  Layer (Subcutaneous Tissue) Exposed: Yes Tendon Exposed: No Muscle Exposed: No Joint Exposed: No Bone Exposed: No Treatment Notes Wound #2 (Lower Leg) Wound Laterality: Left, Lateral Cleanser Soap and Water Discharge Instruction: Gently cleanse wound with antibacterial soap, rinse and pat dry prior to dressing wounds Peri-Wound Care Topical [name], [name] ([id number]) Primary Dressing Hydrofera Blue Ready Transfer Foam, 2.5x2.5 (in/in) Discharge Instruction: Apply Hydrofera Blue Ready to wound bed as directed Secondary Dressing ABD Pad 5x9 (in/in) Discharge Instruction: Cover with ABD pad Secured With Compression Wrap Profore Lite LF 3 Multilayer Compression Bandaging System Discharge Instruction: Apply 3 multi-layer wrap as prescribed. Compression Stockings Add-Ons Electronic Signature(s) Signed: 08/13/2021 3:44:10 PM By: Carlene Coria RN Entered By: Carlene Coria on 08/13/2021 14:22:27 [name] ([id number]) -------------------------------------------------------------------------------- [place] Details Patient Name: [name] Date of Service: 08/13/2021 2:00 PM Medical Record Number: [id number] Patient Account Number: [id number] Date of Birth/Sex: 04-Dec-1945 (75 y.o. F) Treating RN: Carlene Coria Primary Care Matraca Hunkins: [name] Other Clinician: Referring Cordarryl Monrreal: [name] Treating Noretta Frier/Extender: Skipper Cliche in Treatment: 29 Vital Signs Time Taken: 14:08 Temperature (F): 97.6 Height (in): 69 Pulse (bpm): 63 Weight (lbs): 244 Respiratory Rate (breaths/min): 18 Body Mass Index (BMI): 36 Blood Pressure (mmHg): 152/71 Reference Range: 80 - 120 mg / dl Electronic Signature(s) Signed: 08/13/2021 3:44:10 PM By: Carlene Coria RN Entered By: Carlene Coria on 08/13/2021 14:08:37

## 2021-08-13 NOTE — Telephone Encounter (Signed)
Patient was a No-Show this morning for her [employer] transportation. I had confirmed pick up time with her yesterday on 12/5. Left a VM with patient (she stated that she can reached at her daughter's phone # ) and left her my direct ext.

## 2021-08-14 ENCOUNTER — Inpatient Hospital Stay: Payer: Medicare Other

## 2021-08-14 ENCOUNTER — Inpatient Hospital Stay: Payer: Medicare Other | Admitting: Hospice and Palliative Medicine

## 2021-08-14 NOTE — Progress Notes (Signed)
ONETA, SIGMAN ([id number]) Visit Report for 07/30/2021 Arrival Information Details Patient Name: [name], [name] Date of Service: 07/30/2021 1:15 PM Medical Record Number: [id number] Patient Account Number: [id number] Date of Birth/Sex: 12-26-1945 (75 y.o. F) Treating RN: Carlene Coria Primary Care Yamen Castrogiovanni: [name] Other Clinician: Referring Emalyn Schou: [name] Treating Mahli Glahn/Extender: Skipper Cliche in Treatment: 27 Visit Information History Since Last Visit All ordered tests and consults were completed: No Patient Arrived: Wheel Chair Added or deleted any medications: No Arrival Time: 13:33 Any new allergies or adverse reactions: No Accompanied By: self Had a fall or experienced change in No Transfer Assistance: None activities of daily living that may affect Patient Identification Verified: Yes risk of falls: Secondary Verification Process Completed: Yes Signs or symptoms of abuse/neglect since last visito No Patient Requires Transmission-Based Precautions: No Hospitalized since last visit: No Patient Has Alerts: Yes Implantable device outside of the clinic excluding No Patient Alerts: NOT DIABETIC cellular tissue based products placed in the center since last visit: Has Dressing in Place as Prescribed: Yes Has Compression in Place as Prescribed: Yes Pain Present Now: No Electronic Signature(s) Signed: 08/14/2021 1:44:34 PM By: Carlene Coria RN Entered By: Carlene Coria on 07/30/2021 13:39:56 [name] ([id number]) -------------------------------------------------------------------------------- Clinic Level of Care Assessment Details Patient Name: [name] Date of Service: 07/30/2021 1:15 PM Medical Record Number: [id number] Patient Account Number: [id number] Date of Birth/Sex: 1946-01-13 (75 y.o. F) Treating RN: Carlene Coria Primary Care Prem Coykendall: [name] Other Clinician: Referring Jersee Winiarski: [name] Treating Kelli Egolf/Extender:  Skipper Cliche in Treatment: 27 Clinic Level of Care Assessment Items TOOL 1 Quantity Score [] - Use when EandM and Procedure is performed on INITIAL visit 0 ASSESSMENTS - Nursing Assessment / Reassessment [] - General Physical Exam (combine w/ comprehensive assessment (listed just below) when performed on new 0 pt. evals) [] - 0 Comprehensive Assessment (HX, ROS, Risk Assessments, Wounds Hx, etc.) ASSESSMENTS - Wound and Skin Assessment / Reassessment [] - Dermatologic / Skin Assessment (not related to wound area) 0 ASSESSMENTS - Ostomy and/or Continence Assessment and Care [] - Incontinence Assessment and Management 0 [] - 0 Ostomy Care Assessment and Management (repouching, etc.) PROCESS - Coordination of Care [] - Simple Patient / Family Education for ongoing care 0 [] - 0 Complex (extensive) Patient / Family Education for ongoing care [] - 0 Staff obtains Programmer, systems, Records, Test Results / Process Orders [] - 0 Staff telephones HHA, Nursing Homes / Clarify orders / etc [] - 0 Routine Transfer to another Facility (non-emergent condition) [] - 0 Routine Hospital Admission (non-emergent condition) [] - 0 New Admissions / Biomedical engineer / Ordering NPWT, Apligraf, etc. [] - 0 Emergency Hospital Admission (emergent condition) PROCESS - Special Needs [] - Pediatric / Minor Patient Management 0 [] - 0 Isolation Patient Management [] - 0 Hearing / Language / Visual special needs [] - 0 Assessment of Community assistance (transportation, D/C planning, etc.) [] - 0 Additional assistance / Altered mentation [] - 0 Support Surface(s) Assessment (bed, cushion, seat, etc.) INTERVENTIONS - Miscellaneous [] - External ear exam 0 [] - 0 Patient Transfer (multiple staff / Civil Service fast streamer / Similar devices) [] - 0 Simple Staple / Suture removal (25 or less) [] - 0 Complex Staple / Suture removal (26 or more) [] - 0 Hypo/Hyperglycemic Management (do not check if billed  separately) [] - 0 Ankle / Brachial Index (ABI) - do not check if billed separately Has the patient been seen at the hospital within the  last three years: Yes Total Score: 0 Level Of Care: ____ [name] ([id number]) Electronic Signature(s) Signed: 08/14/2021 1:44:34 PM By: Carlene Coria RN Entered By: Carlene Coria on 07/30/2021 14:04:14 [name] ([id number]) -------------------------------------------------------------------------------- Encounter Discharge Information Details Patient Name: [name] Date of Service: 07/30/2021 1:15 PM Medical Record Number: [id number] Patient Account Number: [id number] Date of Birth/Sex: Apr 08, 1946 (75 y.o. F) Treating RN: Carlene Coria Primary Care Ahlayah Tarkowski: [name] Other Clinician: Referring Nader Boys: [name] Treating Dayne Dekay/Extender: Skipper Cliche in Treatment: 27 Encounter Discharge Information Items Post Procedure Vitals Discharge Condition: Stable Temperature (F): 98.4 Ambulatory Status: Wheelchair Pulse (bpm): 67 Discharge Destination: Home Respiratory Rate (breaths/min): 18 Transportation: Private Auto Blood Pressure (mmHg): 172/99 Accompanied By: self Schedule Follow-up Appointment: Yes Clinical Summary of Care: Patient Declined Electronic Signature(s) Signed: 08/14/2021 1:44:34 PM By: Carlene Coria RN Entered By: Carlene Coria on 07/30/2021 14:15:49 [name] ([id number]) -------------------------------------------------------------------------------- Lower Extremity Assessment Details Patient Name: [name] Date of Service: 07/30/2021 1:15 PM Medical Record Number: [id number] Patient Account Number: [id number] Date of Birth/Sex: 06-22-1946 (75 y.o. F) Treating RN: Carlene Coria Primary Care Shad Ledvina: [name] Other Clinician: Referring Adylynn Hertenstein: [name] Treating Tamula Morrical/Extender: Skipper Cliche in Treatment: 27 Edema Assessment Assessed: [Left: No] [Right:  No] Edema: [Left: Ye] [Right: s] Calf Left: Right: Point of Measurement: 27 cm From Medial Instep 37 cm Ankle Left: Right: Point of Measurement: 10 cm From Medial Instep 27 cm Vascular Assessment Pulses: Dorsalis Pedis Palpable: [Left:Yes] Electronic Signature(s) Signed: 08/14/2021 1:44:34 PM By: Carlene Coria RN Entered By: Carlene Coria on 07/30/2021 13:47:34 [name] ([id number]) -------------------------------------------------------------------------------- Multi Wound Chart Details Patient Name: [name] Date of Service: 07/30/2021 1:15 PM Medical Record Number: [id number] Patient Account Number: [id number] Date of Birth/Sex: 01-15-1946 (75 y.o. F) Treating RN: Carlene Coria Primary Care Miriah Maruyama: [name] Other Clinician: Referring Dell Hurtubise: [name] Treating Sacha Topor/Extender: Skipper Cliche in Treatment: 27 Vital Signs Height(in): 69 Pulse(bpm): 61 Weight(lbs): 244 Blood Pressure(mmHg): 172/99 Body Mass Index(BMI): 36 Temperature(F): 98.4 Respiratory Rate(breaths/min): 18 Photos: [N/A:N/A] Wound Location: Left, Lateral Lower Leg N/A N/A Wounding Event: Gradually Appeared N/A N/A Primary Etiology: Venous Leg Ulcer N/A N/A Comorbid History: Cataracts, Lymphedema, Sleep N/A N/A Apnea, Congestive Heart Failure, Hypertension, Osteoarthritis, Neuropathy, Received Chemotherapy, Received Radiation Date Acquired: 09/08/2020 N/A N/A Weeks of Treatment: 27 N/A N/A Wound Status: Open N/A N/A Measurements L x W x D (cm) 0.6x0.4x0.1 N/A N/A Area (cm) : 0.188 N/A N/A Volume (cm) : 0.019 N/A N/A % Reduction in Area: 96.00% N/A N/A % Reduction in Volume: 96.00% N/A N/A Classification: Full Thickness Without Exposed N/A N/A Support Structures Exudate Amount: Medium N/A N/A Exudate Type: Serosanguineous N/A N/A Exudate Color: red, brown N/A N/A Granulation Amount: Large (67-100%) N/A N/A Granulation Quality: Red N/A N/A Necrotic Amount: None  Present (0%) N/A N/A Exposed Structures: Fat Layer (Subcutaneous Tissue): N/A N/A Yes Fascia: No Tendon: No Muscle: No Joint: No Bone: No Epithelialization: Small (1-33%) N/A N/A Treatment Notes Electronic Signature(s) Signed: 08/14/2021 1:44:34 PM By: Carlene Coria RN Entered By: Carlene Coria on 07/30/2021 14:01:04 [name] ([id number]) -------------------------------------------------------------------------------- Multi-Disciplinary Care Plan Details Patient Name: [name] Date of Service: 07/30/2021 1:15 PM Medical Record Number: [id number] Patient Account Number: [id number] Date of Birth/Sex: [birth year] (75 y.o. F) Treating RN: Carlene Coria Primary Care Pawel Soules: [name] Other Clinician: Referring Kenzly Rogoff: [name] Treating Brittie Whisnant/Extender: Skipper Cliche in Treatment: 27 Active Inactive Wound/Skin Impairment Nursing Diagnoses: Impaired tissue integrity Goals: Patient/caregiver will verbalize understanding of skin care regimen Date  Initiated: 01/16/2021 Date Inactivated: 02/18/2021 Target Resolution Date: 02/16/2021 Goal Status: Met Ulcer/skin breakdown will have a volume reduction of 30% by week 4 Date Initiated: 01/16/2021 Date Inactivated: 02/18/2021 Target Resolution Date: 02/16/2021 Goal Status: Met Ulcer/skin breakdown will have a volume reduction of 50% by week 8 Date Initiated: 01/16/2021 Date Inactivated: 04/01/2021 Target Resolution Date: 03/18/2021 Goal Status: Unmet Unmet Reason: comorbities Ulcer/skin breakdown will have a volume reduction of 80% by week 12 Date Initiated: 04/01/2021 Date Inactivated: 04/23/2021 Target Resolution Date: 04/18/2021 Goal Status: Unmet Unmet Reason: comorbities Ulcer/skin breakdown will heal within 14 weeks Date Initiated: 04/01/2021 Target Resolution Date: 05/19/2021 Goal Status: Active Interventions: Assess ulceration(s) every visit Treatment Activities: Topical wound management initiated :  01/16/2021 Notes: Electronic Signature(s) Signed: 08/14/2021 1:44:34 PM By: Carlene Coria RN Entered By: Carlene Coria on 07/30/2021 14:00:50 [name] ([id number]) -------------------------------------------------------------------------------- Pain Assessment Details Patient Name: [name] Date of Service: 07/30/2021 1:15 PM Medical Record Number: [id number] Patient Account Number: [id number] Date of Birth/Sex: 1946/08/24 (75 y.o. F) Treating RN: Carlene Coria Primary Care Hajime Asfaw: [name] Other Clinician: Referring Omare Bilotta: [name] Treating Tyce Delcid/Extender: Skipper Cliche in Treatment: 27 Active Problems Location of Pain Severity and Description of Pain Patient Has Paino No Site Locations Pain Management and Medication Current Pain Management: Electronic Signature(s) Signed: 08/14/2021 1:44:34 PM By: Carlene Coria RN Entered By: Carlene Coria on 07/30/2021 13:40:28 [name] ([id number]) -------------------------------------------------------------------------------- Patient/Caregiver Education Details Patient Name: [name] Date of Service: 07/30/2021 1:15 PM Medical Record Number: [id number] Patient Account Number: [id number] Date of Birth/Gender: 10/22/1945 (75 y.o. F) Treating RN: Carlene Coria Primary Care Physician: [name] Other Clinician: Referring Physician: [name] Treating Physician/Extender: Skipper Cliche in Treatment: 24 Education Assessment Education Provided To: Patient Education Topics Provided Wound/Skin Impairment: Methods: Explain/Verbal Responses: State content correctly Electronic Signature(s) Signed: 08/14/2021 1:44:34 PM By: Carlene Coria RN Entered By: Carlene Coria on 07/30/2021 14:14:59 [name] ([id number]) -------------------------------------------------------------------------------- Wound Assessment Details Patient Name: [name] Date of Service: 07/30/2021 1:15  PM Medical Record Number: [id number] Patient Account Number: [id number] Date of Birth/Sex: 12/17/1945 (75 y.o. F) Treating RN: Carlene Coria Primary Care Burman Bruington: [name] Other Clinician: Referring Tyra Gural: [name] Treating Darinda [place]/Extender: Skipper Cliche in Treatment: 27 Wound Status Wound Number: 2 Primary Venous Leg Ulcer Etiology: Wound Location: Left, Lateral Lower Leg Wound Open Wounding Event: Gradually Appeared Status: Date Acquired: 09/08/2020 Comorbid Cataracts, Lymphedema, Sleep Apnea, Congestive Heart Weeks Of Treatment: 27 History: Failure, Hypertension, Osteoarthritis, Neuropathy, Clustered Wound: No Received Chemotherapy, Received Radiation Photos Wound Measurements Length: (cm) 0.6 Width: (cm) 0.4 Depth: (cm) 0.1 Area: (cm) 0.188 Volume: (cm) 0.019 % Reduction in Area: 96% % Reduction in Volume: 96% Epithelialization: Small (1-33%) Tunneling: No Undermining: No Wound Description Classification: Full Thickness Without Exposed Support Structu Exudate Amount: Medium Exudate Type: Serosanguineous Exudate Color: red, brown res Foul Odor After Cleansing: No Slough/Fibrino No Wound Bed Granulation Amount: Large (67-100%) Exposed Structure Granulation Quality: Red Fascia Exposed: No Necrotic Amount: None Present (0%) Fat Layer (Subcutaneous Tissue) Exposed: Yes Tendon Exposed: No Muscle Exposed: No Joint Exposed: No Bone Exposed: No Electronic Signature(s) Signed: 08/14/2021 1:44:34 PM By: Carlene Coria RN Entered By: Carlene Coria on 07/30/2021 13:47:01 [name] ([id number]) -------------------------------------------------------------------------------- Vitals Details Patient Name: [name] Date of Service: 07/30/2021 1:15 PM Medical Record Number: [id number] Patient Account Number: [id number] Date of Birth/Sex: 05/25/1946 (75 y.o. F) Treating RN: Carlene Coria Primary Care Margaret Staggs: [name] Other  Clinician: Referring Wilkins Elpers: [name] Treating Dorthy Hustead/Extender: Skipper Cliche in  Treatment: 27 Vital Signs Time Taken: 13:40 Temperature (F): 98.4 Height (in): 69 Pulse (bpm): 67 Weight (lbs): 244 Respiratory Rate (breaths/min): 18 Body Mass Index (BMI): 36 Blood Pressure (mmHg): 172/99 Reference Range: 80 - 120 mg / dl Electronic Signature(s) Signed: 08/14/2021 1:44:34 PM By: Carlene Coria RN Entered By: Carlene Coria on 07/30/2021 13:40:20

## 2021-08-16 ENCOUNTER — Other Ambulatory Visit: Payer: Medicare Other

## 2021-08-16 ENCOUNTER — Ambulatory Visit: Payer: Medicare Other | Admitting: Nurse Practitioner

## 2021-08-16 ENCOUNTER — Inpatient Hospital Stay: Payer: Medicare Other | Admitting: Hospice and Palliative Medicine

## 2021-08-16 ENCOUNTER — Inpatient Hospital Stay: Payer: Medicare Other

## 2021-08-19 ENCOUNTER — Encounter: Payer: Medicare Other | Admitting: Hospice and Palliative Medicine

## 2021-08-19 ENCOUNTER — Telehealth: Payer: Self-pay | Admitting: Oncology

## 2021-08-19 ENCOUNTER — Other Ambulatory Visit: Payer: Medicare Other

## 2021-08-19 NOTE — Telephone Encounter (Signed)
Her original appointment was on 12/16 and then it was moved up several times because we had various transportation issues. On Friday I confirmed with her and her daughter that her new appointment was on 12/16 giving transportation 3+ days to arrange.   Gabriel Cirri, she is set up correct? Thank you for your help.

## 2021-08-19 NOTE — Telephone Encounter (Signed)
Pt called in about her appt. She thinks that it is on 12-16 and not 12-14. Someone cancelled and she says she didn't about it. She can't get a ride for 12-14.She is in a wheelchair. Please give her a call back at [phone]

## 2021-08-20 ENCOUNTER — Other Ambulatory Visit: Payer: Self-pay

## 2021-08-20 ENCOUNTER — Encounter: Payer: Medicare Other | Admitting: Physician Assistant

## 2021-08-20 DIAGNOSIS — I87332 Chronic venous hypertension (idiopathic) with ulcer and inflammation of left lower extremity: Secondary | ICD-10-CM | POA: Diagnosis not present

## 2021-08-20 NOTE — Progress Notes (Addendum)
[name], [name] ([id number]) Visit Report for 08/20/2021 Arrival Information Details Patient Name: [name] Date of Service: 08/20/2021 2:00 PM Medical Record Number: [id number] Patient Account Number: [id number] Date of Birth/Sex: [birth year] (75 y.o. F) Treating RN: Carlene Coria Primary Care Hadrian Yarbrough: [name] Other Clinician: Referring Bay Jarquin: [name] Treating Ralphael Southgate/Extender: Skipper Cliche in Treatment: 57 Visit Information History Since Last Visit All ordered tests and consults were completed: No Patient Arrived: Wheel Chair Added or deleted any medications: No Arrival Time: 13:58 Any new allergies or adverse reactions: No Accompanied By: self Had a fall or experienced change in No Transfer Assistance: None activities of daily living that may affect Patient Identification Verified: Yes risk of falls: Secondary Verification Process Completed: Yes Signs or symptoms of abuse/neglect since last visito No Patient Requires Transmission-Based Precautions: No Hospitalized since last visit: No Patient Has Alerts: Yes Implantable device outside of the clinic excluding No Patient Alerts: NOT DIABETIC cellular tissue based products placed in the center since last visit: Has Dressing in Place as Prescribed: Yes Has Compression in Place as Prescribed: Yes Pain Present Now: No Electronic Signature(s) Signed: 08/21/2021 4:05:54 PM By: Carlene Coria RN Entered By: Carlene Coria on 08/20/2021 13:59:46 [name] ([id number]) -------------------------------------------------------------------------------- Clinic Level of Care Assessment Details Patient Name: [name] Date of Service: 08/20/2021 2:00 PM Medical Record Number: [id number] Patient Account Number: [id number] Date of Birth/Sex: Jun 18, 1946 (75 y.o. F) Treating RN: Carlene Coria Primary Care Bellarose Burtt: [name] Other Clinician: Referring Iran Rowe: [name] Treating  Tayveon Lombardo/Extender: Skipper Cliche in Treatment: 30 Clinic Level of Care Assessment Items TOOL 4 Quantity Score X - Use when only an EandM is performed on FOLLOW-UP visit 1 0 ASSESSMENTS - Nursing Assessment / Reassessment X - Reassessment of Co-morbidities (includes updates in patient status) 1 10 X- 1 5 Reassessment of Adherence to Treatment Plan ASSESSMENTS - Wound and Skin Assessment / Reassessment X - Simple Wound Assessment / Reassessment - one wound 1 5 []  - 0 Complex Wound Assessment / Reassessment - multiple wounds []  - 0 Dermatologic / Skin Assessment (not related to wound area) ASSESSMENTS - Focused Assessment []  - Circumferential Edema Measurements - multi extremities 0 []  - 0 Nutritional Assessment / Counseling / Intervention []  - 0 Lower Extremity Assessment (monofilament, tuning fork, pulses) []  - 0 Peripheral Arterial Disease Assessment (using hand held doppler) ASSESSMENTS - Ostomy and/or Continence Assessment and Care []  - Incontinence Assessment and Management 0 []  - 0 Ostomy Care Assessment and Management (repouching, etc.) PROCESS - Coordination of Care X - Simple Patient / Family Education for ongoing care 1 15 []  - 0 Complex (extensive) Patient / Family Education for ongoing care []  - 0 Staff obtains Programmer, systems, Records, Test Results / Process Orders []  - 0 Staff telephones HHA, Nursing Homes / Clarify orders / etc []  - 0 Routine Transfer to another Facility (non-emergent condition) []  - 0 Routine Hospital Admission (non-emergent condition) []  - 0 New Admissions / Biomedical engineer / Ordering NPWT, Apligraf, etc. []  - 0 Emergency Hospital Admission (emergent condition) X- 1 10 Simple Discharge Coordination []  - 0 Complex (extensive) Discharge Coordination PROCESS - Special Needs []  - Pediatric / Minor Patient Management 0 []  - 0 Isolation Patient Management []  - 0 Hearing / Language / Visual special needs []  - 0 Assessment of  Community assistance (transportation, D/C planning, etc.) []  - 0 Additional assistance / Altered mentation []  - 0 Support Surface(s) Assessment (bed, cushion, seat, etc.) INTERVENTIONS - Wound Cleansing / Measurement [name], [name] ([id number])  X- 1 5 Simple Wound Cleansing - one wound []  - 0 Complex Wound Cleansing - multiple wounds []  - 0 Wound Imaging (photographs - any number of wounds) []  - 0 Wound Tracing (instead of photographs) X- 1 5 Simple Wound Measurement - one wound []  - 0 Complex Wound Measurement - multiple wounds INTERVENTIONS - Wound Dressings []  - Small Wound Dressing one or multiple wounds 0 []  - 0 Medium Wound Dressing one or multiple wounds []  - 0 Large Wound Dressing one or multiple wounds []  - 0 Application of Medications - topical []  - 0 Application of Medications - injection INTERVENTIONS - Miscellaneous []  - External ear exam 0 []  - 0 Specimen Collection (cultures, biopsies, blood, body fluids, etc.) []  - 0 Specimen(s) / Culture(s) sent or taken to Lab for analysis []  - 0 Patient Transfer (multiple staff / Civil Service fast streamer / Similar devices) []  - 0 Simple Staple / Suture removal (25 or less) []  - 0 Complex Staple / Suture removal (26 or more) []  - 0 Hypo / Hyperglycemic Management (close monitor of Blood Glucose) []  - 0 Ankle / Brachial Index (ABI) - do not check if billed separately X- 1 5 Vital Signs Has the patient been seen at the hospital within the last three years: Yes Total Score: 60 Level Of Care: New/Established - Level 2 Electronic Signature(s) Signed: 08/21/2021 4:05:54 PM By: Carlene Coria RN Entered By: Carlene Coria on 08/20/2021 15:17:24 [name] ([id number]) -------------------------------------------------------------------------------- Encounter Discharge Information Details Patient Name: [name] Date of Service: 08/20/2021 2:00 PM Medical Record Number: [id number] Patient Account Number:  [id number] Date of Birth/Sex: 22-Jul-1946 (75 y.o. F) Treating RN: Carlene Coria Primary Care Aniket Paye: [name] Other Clinician: Referring Nazar Kuan: [name] Treating Arie Powell/Extender: Skipper Cliche in Treatment: 30 Encounter Discharge Information Items Discharge Condition: Stable Ambulatory Status: Wheelchair Discharge Destination: Home Transportation: Private Auto Accompanied By: self Schedule Follow-up Appointment: Yes Clinical Summary of Care: Patient Declined Electronic Signature(s) Signed: 08/20/2021 3:19:08 PM By: Carlene Coria RN Entered By: Carlene Coria on 08/20/2021 15:19:08 [name] ([id number]) -------------------------------------------------------------------------------- Lower Extremity Assessment Details Patient Name: [name] Date of Service: 08/20/2021 2:00 PM Medical Record Number: [id number] Patient Account Number: [id number] Date of Birth/Sex: 1946/03/13 (75 y.o. F) Treating RN: Carlene Coria Primary Care Sherril Heyward: [name] Other Clinician: Referring Keanna Tugwell: [name] Treating Nazareth Norenberg/Extender: Skipper Cliche in Treatment: 30 Edema Assessment Assessed: [Left: No] [Right: No] Edema: [Left: Ye] [Right: s] Calf Left: Right: Point of Measurement: 27 cm From Medial Instep 39 cm Ankle Left: Right: Point of Measurement: 10 cm From Medial Instep 25 cm Vascular Assessment Pulses: Dorsalis Pedis Palpable: [Left:Yes] Electronic Signature(s) Signed: 08/21/2021 4:05:54 PM By: Carlene Coria RN Entered By: Carlene Coria on 08/20/2021 14:05:38 [name] ([id number]) -------------------------------------------------------------------------------- Multi Wound Chart Details Patient Name: [name] Date of Service: 08/20/2021 2:00 PM Medical Record Number: [id number] Patient Account Number: [id number] Date of Birth/Sex: 1946-04-27 (75 y.o. F) Treating RN: Carlene Coria Primary Care Wyona Neils: [name] Other  Clinician: Referring Eleanora Guinyard: [name] Treating Astor Gentle/Extender: Skipper Cliche in Treatment: 30 Vital Signs Height(in): 69 Pulse(bpm): 28 Weight(lbs): 244 Blood Pressure(mmHg): 206/99 Body Mass Index(BMI): 36 Temperature(F): 98.2 Respiratory Rate(breaths/min): 18 Photos: [2:No Photos] [N/A:N/A] Wound Location: [2:Left, Lateral Lower Leg] [N/A:N/A] Wounding Event: [2:Gradually Appeared] [N/A:N/A] Primary Etiology: [2:Venous Leg Ulcer] [N/A:N/A] Comorbid History: [2:Cataracts, Lymphedema, Sleep Apnea, Congestive Heart Failure, Hypertension, Osteoarthritis, Neuropathy, Received Chemotherapy, Received Radiation] [N/A:N/A] Date Acquired: [2:09/08/2020] [N/A:N/A] Weeks of Treatment: [2:30] [N/A:N/A] Wound Status: [2:Open] [N/A:N/A] Measurements L x W  x D (cm) [2:0x0x0] [N/A:N/A] Area (cm) : [2:0] [N/A:N/A] Volume (cm) : [2:0] [N/A:N/A] % Reduction in Area: [2:100.00%] [N/A:N/A] % Reduction in Volume: [2:100.00%] [N/A:N/A] Classification: [2:Full Thickness Without Exposed Support Structures] [N/A:N/A] Exudate Amount: [2:None Present] [N/A:N/A] Granulation Amount: [2:None Present (0%)] [N/A:N/A] Necrotic Amount: [2:None Present (0%)] [N/A:N/A] Exposed Structures: [2:Fascia: No Fat Layer (Subcutaneous Tissue): No Tendon: No Muscle: No Joint: No Bone: No Large (67-100%)] [N/A:N/A N/A] Treatment Notes Electronic Signature(s) Signed: 08/21/2021 4:05:54 PM By: Carlene Coria RN Entered By: Carlene Coria on 08/20/2021 14:46:58 [name] ([id number]) -------------------------------------------------------------------------------- [place] Details Patient Name: [name] Date of Service: 08/20/2021 2:00 PM Medical Record Number: [id number] Patient Account Number: [id number] Date of Birth/Sex: Jun 28, 1946 (75 y.o. F) Treating RN: Carlene Coria Primary Care Biran Mayberry: [name] Other Clinician: Referring Siyona Coto: [name] Treating  Draven Laine/Extender: Skipper Cliche in Treatment: 30 Active Inactive Electronic Signature(s) Signed: 08/21/2021 4:05:54 PM By: Carlene Coria RN Entered By: Carlene Coria on 08/20/2021 14:46:44 [name] ([id number]) -------------------------------------------------------------------------------- Pain Assessment Details Patient Name: [name] Date of Service: 08/20/2021 2:00 PM Medical Record Number: [id number] Patient Account Number: [id number] Date of Birth/Sex: [birth year] (75 y.o. F) Treating RN: Carlene Coria Primary Care Abijah Roussel: [name] Other Clinician: Referring Obie Kallenbach: [name] Treating Zay Yeargan/Extender: Skipper Cliche in Treatment: 30 Active Problems Location of Pain Severity and Description of Pain Patient Has Paino No Site Locations Pain Management and Medication Current Pain Management: Electronic Signature(s) Signed: 08/21/2021 4:05:54 PM By: Carlene Coria RN Entered By: Carlene Coria on 08/20/2021 14:04:18 [name] ([id number]) -------------------------------------------------------------------------------- Patient/Caregiver Education Details Patient Name: [name] Date of Service: 08/20/2021 2:00 PM Medical Record Number: [id number] Patient Account Number: [id number] Date of Birth/Gender: 1945/11/11 (75 y.o. F) Treating RN: Carlene Coria Primary Care Physician: [name] Other Clinician: Referring Physician: [name] Treating Physician/Extender: Skipper Cliche in Treatment: 30 Education Assessment Education Provided To: Patient Education Topics Provided Wound/Skin Impairment: Methods: Explain/Verbal Responses: State content correctly Electronic Signature(s) Signed: 08/21/2021 4:05:54 PM By: Carlene Coria RN Entered By: Carlene Coria on 08/20/2021 15:18:11 [name] ([id number]) -------------------------------------------------------------------------------- Wound Assessment Details Patient Name:  [name] Date of Service: 08/20/2021 2:00 PM Medical Record Number: [id number] Patient Account Number: [id number] Date of Birth/Sex: 09-Aug-1946 (75 y.o. F) Treating RN: Carlene Coria Primary Care Doree Kuehne: [name] Other Clinician: Referring Darrel Baroni: [name] Treating [place] Mcclure/Extender: Skipper Cliche in Treatment: 30 Wound Status Wound Number: 2 Primary Venous Leg Ulcer Etiology: Wound Location: Left, Lateral Lower Leg Wound Open Wounding Event: Gradually Appeared Status: Date Acquired: 09/08/2020 Comorbid Cataracts, Lymphedema, Sleep Apnea, Congestive Heart Weeks Of Treatment: 30 History: Failure, Hypertension, Osteoarthritis, Neuropathy, Clustered Wound: No Received Chemotherapy, Received Radiation Wound Measurements Length: (cm) Width: (cm) Depth: (cm) Area: (cm) Volume: (cm) 0 % Reduction in Area: 100% 0 % Reduction in Volume: 100% 0 Epithelialization: Large (67-100%) 0 Tunneling: No 0 Undermining: No Wound Description Classification: Full Thickness Without Exposed Support Structure Exudate Amount: None Present s Foul Odor After Cleansing: No Slough/Fibrino No Wound Bed Granulation Amount: None Present (0%) Exposed Structure Necrotic Amount: None Present (0%) Fascia Exposed: No Fat Layer (Subcutaneous Tissue) Exposed: No Tendon Exposed: No Muscle Exposed: No Joint Exposed: No Bone Exposed: No Treatment Notes Wound #2 (Lower Leg) Wound Laterality: Left, Lateral Cleanser Peri-Wound Care Topical Primary Dressing Secondary Dressing Secured With Compression Wrap Compression Stockings Add-Ons Electronic Signature(s) Signed: 08/21/2021 4:05:54 PM By: Carlene Coria RN Entered By: Carlene Coria on 08/20/2021 14:05:13 [name] ([id number]) -------------------------------------------------------------------------------- Vitals Details Patient Name: [name] Date  of Service: 08/20/2021 2:00 PM Medical Record Number:  [id number] Patient Account Number: [id number] Date of Birth/Sex: [birth year] (75 y.o. F) Treating RN: Carlene Coria Primary Care [place] Antolin: [name] Other Clinician: Referring Kamyah Wilhelmsen: [name] Treating Markeya Mincy/Extender: Skipper Cliche in Treatment: 30 Vital Signs Time Taken: 15:55 Temperature (F): 98.2 Height (in): 69 Pulse (bpm): 89 Weight (lbs): 244 Respiratory Rate (breaths/min): 18 Body Mass Index (BMI): 36 Blood Pressure (mmHg): 206/99 Reference Range: 80 - 120 mg / dl Electronic Signature(s) Signed: 08/21/2021 4:05:54 PM By: Carlene Coria RN Entered By: Carlene Coria on 08/20/2021 14:04:12

## 2021-08-20 NOTE — Progress Notes (Addendum)
[name], [name] ([id number]) Visit Report for 08/20/2021 Chief Complaint Document Details Patient Name: [name], [name] Date of Service: 08/20/2021 2:00 PM Medical Record Number: [id number] Patient Account Number: [id number] Date of Birth/Sex: 12-18-1945 (75 y.o. F) Treating RN: [name] Primary Care Provider: Tomasa [name] Other Clinician: Referring Provider: Tomasa [name] Treating Provider/Extender: [name] in Treatment: 30 Information Obtained from: Patient Chief Complaint Left LE Ulcer Electronic Signature(s) Signed: 08/20/2021 1:57:52 PM By: [name] Keeler PA-C Entered By: [name] on 08/20/2021 13:57:51 [name] ([id number]) -------------------------------------------------------------------------------- HPI Details Patient Name: [name] Date of Service: 08/20/2021 2:00 PM Medical Record Number: [id number] Patient Account Number: [id number] Date of Birth/Sex: [birth year] (75 y.o. F) Treating RN: [name] Primary Care Provider: Tomasa [name] Other Clinician: Referring Provider: Tomasa [name] Treating Provider/Extender: [name] in Treatment: 30 History of Present Illness HPI Description: 03/15/2020 upon evaluation today patient presents today for initial evaluation here in our clinic concerning a wound that is on the left posterior lower extremity. Unfortunately this has been giving the patient some discomfort at this point which she notes has been affected her pretty much daily. The patient does have a history of venous insufficiency, hypertension, congestive heart failure, and a history of breast cancer. Fortunately there does not appear to be evidence of active infection at this time which is great news. No fevers, chills, nausea, vomiting, or diarrhea. Wound is not extremely large but does have some slough covering the surface of the wound. This is going require sharp debridement today. 03/15/2020 upon evaluation today patient  appears to be doing fairly well in regard to her wound. She does have some slough noted on the surface of the wound currently but I do believe the Iodoflex has been beneficial over the past week. There is no signs of active infection at this time which is great news and overall very pleased with the progress. No fevers, chills, nausea, vomiting, or diarrhea. 04/02/2020 upon evaluation today patient appears to be doing a little better in regard to her wound size wise this is not tremendously smaller she does have some slough buildup on the surface of the wound. With that being said this is going require some sharp debridement today to clear away some of the necrotic debris. Fortunately there is no evidence of active infection at this time. No fevers, chills, nausea, vomiting, or diarrhea. 04/10/2020 on evaluation today patient appears to be doing well with regard to her ulcer which is measuring somewhat smaller today. She actually has more granulation tissue noted which is also good news is no need for sharp debridement today. Fortunately there is no evidence of infection either which is also excellent. No fevers, chills, nausea, vomiting, or diarrhea. 04/26/2020 on evaluation today patient's wound actually is showing signs of good improvement which is great news. There does not appear to be any evidence of active infection. I do believe she is tolerating the collagen at this point. 05/03/2020 upon evaluation today patient's wound actually showed signs of good granulation at this time there does not appear to be any evidence of active infection which is great news and overall very pleased with where things stand. With that being said I do believe that the patient is can require some sharp debridement today but fortunately nothing too significant. 05/11/20 on evaluation today patient appears to be doing well in regard to her leg ulcer. She's been tolerating the dressing changes without complication.  Fortunately there is no signs of active infection at this time.  Overall I'm very pleased with where things stand. 05/18/2020 upon evaluation today patient's wound is showing signs of improvement is very dry and I think the collagen for the most part just dried to the wound bed. I am going to clean this away with sharp debridement in order to get this under control in my opinion. 05/25/2020 upon evaluation today patient actually appears to be doing well in regard to her leg ulcer. In fact upon inspection it appears she could potentially be completely healed but that is not guaranteed based on what we are seeing. There does not appear to be any signs of active infection which is great news. 05/31/2020 on evaluation today patient appears to be doing well with regard to her wounds. She in fact appears to be almost completely healed on the left leg there is just a very small opening remaining point 1 06/08/2020 upon evaluation today patient appears to be doing excellent in regard to her leg ulcer on the left in fact it appears to be that she is completely healed as of today. I see no signs of active infection at this time which is great news. No fevers, chills, nausea, vomiting, or diarrhea. READMISSION 01/16/2021 Patient that we have had in this clinic with a wound on the left posterior calf in the summer 2021 extending into October. She was felt to have chronic venous insufficiency. Per the patient's description she was discharged with what sounds like external compression stockings although she could not afford the "$75 per leg". She therefore did not get anything. Apparently her wound that she has currently is been in existence since January. She has been followed at vein and vascular with Unna boots and I think calcium alginate. She had ABIs done on 06/25/2020 that showed an noncompressible ABI on the right leg and the left leg but triphasic waveforms with good great toe pressures and a TBI of 0.90 on the  right and 0.91 on the left Surprisingly the patient also had venous reflux studies that were really unremarkable. This included no evidence of DVTs or SVTs but there was no evidence of deep venous insufficiency or superficial venous insufficiency in the greater saphenous or short saphenous veins bilaterally. I would wonder about more central venous issues or perhaps this is all lymphedema 01/25/2021 upon evaluation today patient appears to be doing decently well and guard to her wound. The good news is the Iodoflex that is job she saw Dr. Dellia Nims last week for readmission and to be honest I think that this has done extremely well over that week. I do believe that she can tolerate the sharp debridement today which will be good. I think that cleaning off the wound will likely allow [place] to be able to use a different type of dressing I do not think the Iodoflex will even be necessary based on how clean the wound looks. Fortunately there is no sign of active infection at this time which is great news. No fevers, chills, nausea, vomiting, or diarrhea. 02/12/2021 upon evaluation today patient appears to be doing well with regard to her leg ulcer. We did switch to alginate when she came for nurse visit I think is doing much better for her. Fortunately there does not appear to be any signs of active infection at this time which is great news. No fevers, chills, nausea, vomiting, or diarrhea. [name], [name] ([id number]) 02/18/2021 upon evaluation today patient's wound is actually showing signs of good improvement. I am very pleased with where things stand  currently. I do not see any signs of active infection which is great news and overall I feel like she is making good progress. The patient likewise is happy that things are doing so well and that this is measuring somewhat smaller. 02/25/2021 upon evaluation today patient actually appears to be doing quite well in regard to her wounds. She has been tolerating the  dressing changes without complication. Fortunately there does not appear to be any signs of active infection which is great news and overall I am extremely pleased with where things stand. No fevers, chills, nausea, vomiting, or diarrhea. She does have a lot of edema I really do think she needs compression socks to be worn in order to prevent things from causing additional issues for her currently. Especially on the right leg she should be wearing compression socks all the time to be honest. 03/04/2021 upon evaluation today patient's leg ulcer actually appears to be doing pretty well which is great news. There does not appear to be any significant change but overall the appearance is better even though the size is not necessarily reflecting a great improvement. Fortunately there does not appear to be any signs of active infection. No fevers, chills, nausea, vomiting, or diarrhea. 03/12/2021 upon evaluation today patient appears to be doing well with regard to her wounds. She has been tolerating the dressing changes without complication. The good news is that the wound on her left lateral leg is doing much better and is showing signs of good improvement. Overall her swelling is controlled well as well. She does need some compression socks she plans to go Jefferey socks next week. 03/18/2021 on evaluation today patient's wound actually is showing signs of good improvement. She does have a little bit of slough this can require some sharp debridement today. 03/25/2021 upon evaluation today patient appears to be doing well with regard to her wound on the left lateral leg. She has been tolerating the dressing changes without complication. Fortunately there does not appear to be any signs of active infection at this time. No fever chills no 04/01/2021 upon evaluation today patient's wound is showing signs of improvement and overall very pleased with where things stand at this point. There is no evidence of active  infection which is great news as well and in general I think that she is making great progress. 04/09/2021 upon evaluation today patient appears to be doing well with regard to her ankle ulcer. She is making good progress currently which is great news. There does not appear to be any signs of infection also excellent news. In general I am extremely pleased with where things stand today. No fevers, chills, nausea, vomiting, or diarrhea. 04/23/2021 upon inspection today patient appears to be doing quite well with regard to her leg ulcer. She has been tolerating the dressing changes without complication. She is can require some sharp debridement today but overall seems to be doing excellent. 05/06/2021 upon evaluation today patient unfortunately appears to be doing poorly overall in regard to her swelling. She is actually significantly more swollen than last week despite the fact that her wound is doing better this is not a good trend. I think that she probably needs to see her cardiologist ASAP and I discussed that with her today. This includes the fact that honestly I think she probably is getting need an increase in her diuretic or something to try to clear away some of the excess fluid that she is experiencing at the moment. She is  not been doing her pumps even twice a day but again right now more concerned that if she were to do the pump she would fluid overload her lungs causing other issues as far as her breathing is concerned. Obviously I think she needs to contact them and move up the appointment for the 12th of something sooner 2 weeks is a bit too long to wait with how swollen she has. 05/14/2021 upon evaluation today patient's wound actually showing signs of being a little bit smaller compared to previous. She has been making good progress however. Fortunately there does not appear to be any evidence of active infection which is great. Overall I am extremely pleased in that regard. Nonetheless I am  still concerned that she is having quite a bit of issue as far as the swelling is concerned she has been placed on fluid restriction as well as an increase in the furosemide by her doctor. We will see how things progress. 05/21/2021 upon evaluation today patient appears to be doing well with regard to her wound in general. She has been tolerating the dressing changes without complication. With that being said she has been using silver cell for a while and while this was showing signs of improvement it is now gotten to the point where this has slowed down quite significantly. I do believe that switching to a different dressing may be beneficial for her today. 05/28/2021 upon evaluation today patient actually appears to be doing quite well in regard to her wound. In fact there is really not any need for sharp debridement today based on what I see. The Hydrofera Blue is doing great and overall I think that she is managing quite nicely with the compression wrapping. Her edema is still down quite a bit with the wrapping in place. 06/11/2021 upon evaluation today patient appears to be doing well with regard to her leg ulcer. She has been tolerating the dressing changes without complication. Fortunately there does not appear to be any signs of active infection at this time which is great news. No fevers, chills, nausea, vomiting, or diarrhea. She is going require some sharp debridement today. 10/11; left leg ulcer much better looking and with improved surface area. She is using Hydrofera Blue under 3 layer compression. She does not have any stocking on the right leg which in itself has very significant nonpitting edema 06/25/2021 upon evaluation today patient appears to be doing well with regard to the wound on her left leg. She has been tolerating the dressing changes without complication and overall I am extremely pleased with where things stand today. I do not see any evidence of infection which is great  news. 07/02/2021 upon evaluation today patient's wound actually showing signs of excellent improvement and actually very pleased with where we stand today and the overall appearance. Fortunately there does not appear to be any signs of active infection. No fevers, chills, nausea, vomiting, or diarrhea. 07/09/2021 upon evaluation today patient appears to be doing better in regard to her wound this is measuring smaller and she is headed in the appropriate direction based on what I am seeing currently. I do not see any evidence of active infection systemically which is great news. 07/16/2021 upon evaluation today patient appears to be doing pretty well currently in regard to her leg ulcer. This is measuring smaller and looking much better this is great news. Fortunately there does not appear to be any evidence of active infection systemically which is great news as well.  07/23/2021 upon evaluation today patient's wound is actually showing signs of good improvement this is definitely measuring smaller. I do not see any signs of infection which is great news and overall very pleased in that regard. Overall I think that she is doing well with the Liat Mayol, [place] ([id number]) [place]. 07/30/2020 upon evaluation today patient appears to be doing well in regard to her wound. She has been tolerating the dressing changes without complication think the Hydrofera Blue at this point is actually causing her to dry out a lot as far as the wound bed is concerned. Subsequently I think that we need to try to see what we can do to improve this. Fortunately there does not appear to be any evidence of active infection at this time which is great news. 08/06/2021 upon evaluation today patient appears to be doing well with regards to her wound. Is not measuring significantly smaller initial inspection but upon closer inspection she has a lot of new skin growth across the central portion of the wound. I think will  get very close to complete resolution. 08/13/2021 upon evaluation today patient appears to be doing well with regard to her wound. In fact this appears to be I believe healed although there is still some question as to whether it is completely so. I am concerned about the fact that to be honest she still has some evidence of dry skin that could be just trapping something underneath I still want to debride anything as I am afraid of causing some damage to the good skin for that reason I Georgina Peer probably monitor for 1 more week before completely closing everything out. 08/20/2021 upon evaluation today patient actually appears to be doing excellent. In fact she appears to be completely healed. This was the case last week as well we just wanted to make sure nothing reopened since she does not really have an ability to put on compression socks this is good to be something we need to make sure was well-healed. Nonetheless I think we have achieved that goal as of today. Electronic Signature(s) Signed: 08/20/2021 4:41:07 PM By: [name] Keeler PA-C Entered By: [name] on 08/20/2021 16:41:07 [name] ([id number]) -------------------------------------------------------------------------------- Physical Exam Details Patient Name: [name] Date of Service: 08/20/2021 2:00 PM Medical Record Number: [id number] Patient Account Number: [id number] Date of Birth/Sex: Oct 26, 1945 (75 y.o. F) Treating RN: [name] Primary Care Provider: Tomasa [name] Other Clinician: Referring Provider: Tomasa [name] Treating Provider/Extender: [name] in Treatment: 60 Constitutional Well-nourished and well-hydrated in no acute distress. Respiratory normal breathing without difficulty. Psychiatric this patient is able to make decisions and demonstrates good insight into disease process. Alert and Oriented x 3. pleasant and cooperative. Notes Upon inspection patient's wound bed actually  showed signs of good granulation and epithelization at this point. Fortunately I do not see any evidence of opening which is great and overall I am extremely pleased in that regard. No fevers, chills, nausea, vomiting, or diarrhea. Electronic Signature(s) Signed: 08/20/2021 4:41:32 PM By: [name] Keeler PA-C Entered By: [name] on 08/20/2021 16:41:32 [name] ([id number]) -------------------------------------------------------------------------------- Physician Orders Details Patient Name: [name] Date of Service: 08/20/2021 2:00 PM Medical Record Number: [id number] Patient Account Number: [id number] Date of Birth/Sex: [birth year] (75 y.o. 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 Put garments on first thing when you wake up and remove them before bed. o Moisturize legs daily after removing compression garments. o Elevate, Exercise Daily and Avoid Standing for Long Periods of Time. Wound Treatment Electronic Signature(s) Signed: 08/20/2021 3:16:13 PM By: [name] Coria RN Signed: 08/20/2021 5:37:18 PM By: [name] Keeler PA-C Entered By: [name] on 08/20/2021 15:16:12 [name] ([id number]) -------------------------------------------------------------------------------- Problem List Details Patient Name: [name] Date of Service: 08/20/2021 2:00 PM Medical Record Number:  [id number] Patient Account Number: [id number] Date of Birth/Sex: [birth year] (75 y.o. F) Treating RN: [name] Primary Care Provider: Tomasa [name] Other Clinician: Referring Provider: Tomasa [name] Treating Provider/Extender: [name] in Treatment: 30 Active Problems ICD-10 Encounter Code Description Active Date MDM Diagnosis I89.0 Lymphedema, not elsewhere classified 01/16/2021 No Yes I87.332 Chronic venous hypertension (idiopathic) with ulcer and inflammation of 01/16/2021 No Yes left lower extremity L97.828 Non-pressure chronic ulcer of other part of left lower leg with other 01/16/2021 No Yes specified severity Inactive Problems Resolved Problems Electronic Signature(s) Signed: 08/20/2021 3:18:20 PM By: [name] Coria RN Signed: 08/20/2021 5:37:18 PM By: [name] Keeler PA-C Previous Signature: 08/20/2021 1:57:48 PM Version By: [name] Keeler PA-C Entered By: [name] on 08/20/2021 15:18:20 [name] ([id number]) -------------------------------------------------------------------------------- Progress Note Details Patient Name: [name] Date of Service: 08/20/2021 2:00 PM Medical Record Number: [id number] Patient Account Number: [id number] Date of Birth/Sex: [birth year] (75 y.o. F) Treating RN: [name] Primary Care Provider: Tomasa [name] Other Clinician: Referring Provider: Tomasa [name] Treating Provider/Extender: [name] in Treatment: 30 Subjective Chief Complaint Information obtained from Patient Left LE Ulcer History of Present Illness (HPI) 03/15/2020 upon evaluation today patient presents today for initial evaluation here in our clinic concerning a wound that is on the left posterior lower extremity. Unfortunately this has been giving the patient some discomfort at this point which she notes has been affected her pretty much daily. The patient does have a history of venous insufficiency, hypertension, congestive heart failure, and  a history of breast cancer. Fortunately there does not appear to be evidence of active infection at this time which is great news. No fevers, chills, nausea, vomiting, or diarrhea. Wound is not extremely large but does have some slough covering the surface of the wound. This is going require sharp debridement today. 03/15/2020 upon evaluation today patient appears to be doing fairly well in regard to her wound. She does have some slough noted on the surface of the wound currently but I do believe the Iodoflex has been beneficial over the past week. There is no signs of active infection at this time which is great news and overall very pleased with the progress. No fevers, chills, nausea, vomiting, or diarrhea. 04/02/2020 upon evaluation today patient appears to be doing a little better in regard to her wound size wise this is not tremendously smaller she does have some slough buildup on the surface of the wound. With that being said this is going require some sharp debridement today to clear away some of the necrotic debris. Fortunately there is no evidence of active infection at this time. No fevers, chills, nausea, vomiting, or diarrhea. 04/10/2020 on evaluation today patient appears to be doing well with regard to her  ulcer which is measuring somewhat smaller today. She actually has more granulation tissue noted which is also good news is no need for sharp debridement today. Fortunately there is no evidence of infection either which is also excellent. No fevers, chills, nausea, vomiting, or diarrhea. 04/26/2020 on evaluation today patient's wound actually is showing signs of good improvement which is great news. There does not appear to be any evidence of active infection. I do believe she is tolerating the collagen at this point. 05/03/2020 upon evaluation today patient's wound actually showed signs of good granulation at this time there does not appear to be any evidence of active infection which is  great news and overall very pleased with where things stand. With that being said I do believe that the patient is can require some sharp debridement today but fortunately nothing too significant. 05/11/20 on evaluation today patient appears to be doing well in regard to her leg ulcer. She's been tolerating the dressing changes without complication. Fortunately there is no signs of active infection at this time. Overall I'm very pleased with where things stand. 05/18/2020 upon evaluation today patient's wound is showing signs of improvement is very dry and I think the collagen for the most part just dried to the wound bed. I am going to clean this away with sharp debridement in order to get this under control in my opinion. 05/25/2020 upon evaluation today patient actually appears to be doing well in regard to her leg ulcer. In fact upon inspection it appears she could potentially be completely healed but that is not guaranteed based on what we are seeing. There does not appear to be any signs of active infection which is great news. 05/31/2020 on evaluation today patient appears to be doing well with regard to her wounds. She in fact appears to be almost completely healed on the left leg there is just a very small opening remaining point 1 06/08/2020 upon evaluation today patient appears to be doing excellent in regard to her leg ulcer on the left in fact it appears to be that she is completely healed as of today. I see no signs of active infection at this time which is great news. No fevers, chills, nausea, vomiting, or diarrhea. READMISSION 01/16/2021 Patient that we have had in this clinic with a wound on the left posterior calf in the summer 2021 extending into October. She was felt to have chronic venous insufficiency. Per the patient's description she was discharged with what sounds like external compression stockings although she could not afford the "$75 per leg". She therefore did not get  anything. Apparently her wound that she has currently is been in existence since January. She has been followed at vein and vascular with Unna boots and I think calcium alginate. She had ABIs done on 06/25/2020 that showed an noncompressible ABI on the right leg and the left leg but triphasic waveforms with good great toe pressures and a TBI of 0.90 on the right and 0.91 on the left Surprisingly the patient also had venous reflux studies that were really unremarkable. This included no evidence of DVTs or SVTs but there was no evidence of deep venous insufficiency or superficial venous insufficiency in the greater saphenous or short saphenous veins bilaterally. I would wonder about more central venous issues or perhaps this is all lymphedema 01/25/2021 upon evaluation today patient appears to be doing decently well and guard to her wound. The good news is the Iodoflex that is job she saw Dr.  Dellia Nims last week for readmission and to be honest I think that this has done extremely well over that week. I do believe that she can tolerate the sharp debridement today which will be good. I think that cleaning off the wound will likely allow [place] to be able to use a different type of dressing I do not think the Iodoflex will even be necessary based on how clean the wound looks. Fortunately there is no sign of active infection at this time which is great news. No fevers, chills, nausea, vomiting, or diarrhea. [name], [name] ([id number]) 02/12/2021 upon evaluation today patient appears to be doing well with regard to her leg ulcer. We did switch to alginate when she came for nurse visit I think is doing much better for her. Fortunately there does not appear to be any signs of active infection at this time which is great news. No fevers, chills, nausea, vomiting, or diarrhea. 02/18/2021 upon evaluation today patient's wound is actually showing signs of good improvement. I am very pleased with where things  stand currently. I do not see any signs of active infection which is great news and overall I feel like she is making good progress. The patient likewise is happy that things are doing so well and that this is measuring somewhat smaller. 02/25/2021 upon evaluation today patient actually appears to be doing quite well in regard to her wounds. She has been tolerating the dressing changes without complication. Fortunately there does not appear to be any signs of active infection which is great news and overall I am extremely pleased with where things stand. No fevers, chills, nausea, vomiting, or diarrhea. She does have a lot of edema I really do think she needs compression socks to be worn in order to prevent things from causing additional issues for her currently. Especially on the right leg she should be wearing compression socks all the time to be honest. 03/04/2021 upon evaluation today patient's leg ulcer actually appears to be doing pretty well which is great news. There does not appear to be any significant change but overall the appearance is better even though the size is not necessarily reflecting a great improvement. Fortunately there does not appear to be any signs of active infection. No fevers, chills, nausea, vomiting, or diarrhea. 03/12/2021 upon evaluation today patient appears to be doing well with regard to her wounds. She has been tolerating the dressing changes without complication. The good news is that the wound on her left lateral leg is doing much better and is showing signs of good improvement. Overall her swelling is controlled well as well. She does need some compression socks she plans to go Jefferey socks next week. 03/18/2021 on evaluation today patient's wound actually is showing signs of good improvement. She does have a little bit of slough this can require some sharp debridement today. 03/25/2021 upon evaluation today patient appears to be doing well with regard to her  wound on the left lateral leg. She has been tolerating the dressing changes without complication. Fortunately there does not appear to be any signs of active infection at this time. No fever chills no 04/01/2021 upon evaluation today patient's wound is showing signs of improvement and overall very pleased with where things stand at this point. There is no evidence of active infection which is great news as well and in general I think that she is making great progress. 04/09/2021 upon evaluation today patient appears to be doing well with regard to her ankle ulcer.  She is making good progress currently which is great news. There does not appear to be any signs of infection also excellent news. In general I am extremely pleased with where things stand today. No fevers, chills, nausea, vomiting, or diarrhea. 04/23/2021 upon inspection today patient appears to be doing quite well with regard to her leg ulcer. She has been tolerating the dressing changes without complication. She is can require some sharp debridement today but overall seems to be doing excellent. 05/06/2021 upon evaluation today patient unfortunately appears to be doing poorly overall in regard to her swelling. She is actually significantly more swollen than last week despite the fact that her wound is doing better this is not a good trend. I think that she probably needs to see her cardiologist ASAP and I discussed that with her today. This includes the fact that honestly I think she probably is getting need an increase in her diuretic or something to try to clear away some of the excess fluid that she is experiencing at the moment. She is not been doing her pumps even twice a day but again right now more concerned that if she were to do the pump she would fluid overload her lungs causing other issues as far as her breathing is concerned. Obviously I think she needs to contact them and move up the appointment for the 12th of something sooner 2  weeks is a bit too long to wait with how swollen she has. 05/14/2021 upon evaluation today patient's wound actually showing signs of being a little bit smaller compared to previous. She has been making good progress however. Fortunately there does not appear to be any evidence of active infection which is great. Overall I am extremely pleased in that regard. Nonetheless I am still concerned that she is having quite a bit of issue as far as the swelling is concerned she has been placed on fluid restriction as well as an increase in the furosemide by her doctor. We will see how things progress. 05/21/2021 upon evaluation today patient appears to be doing well with regard to her wound in general. She has been tolerating the dressing changes without complication. With that being said she has been using silver cell for a while and while this was showing signs of improvement it is now gotten to the point where this has slowed down quite significantly. I do believe that switching to a different dressing may be beneficial for her today. 05/28/2021 upon evaluation today patient actually appears to be doing quite well in regard to her wound. In fact there is really not any need for sharp debridement today based on what I see. The Hydrofera Blue is doing great and overall I think that she is managing quite nicely with the compression wrapping. Her edema is still down quite a bit with the wrapping in place. 06/11/2021 upon evaluation today patient appears to be doing well with regard to her leg ulcer. She has been tolerating the dressing changes without complication. Fortunately there does not appear to be any signs of active infection at this time which is great news. No fevers, chills, nausea, vomiting, or diarrhea. She is going require some sharp debridement today. 10/11; left leg ulcer much better looking and with improved surface area. She is using Hydrofera Blue under 3 layer compression. She does not have any  stocking on the right leg which in itself has very significant nonpitting edema 06/25/2021 upon evaluation today patient appears to be doing well with regard  to the wound on her left leg. She has been tolerating the dressing changes without complication and overall I am extremely pleased with where things stand today. I do not see any evidence of infection which is great news. 07/02/2021 upon evaluation today patient's wound actually showing signs of excellent improvement and actually very pleased with where we stand today and the overall appearance. Fortunately there does not appear to be any signs of active infection. No fevers, chills, nausea, vomiting, or diarrhea. 07/09/2021 upon evaluation today patient appears to be doing better in regard to her wound this is measuring smaller and she is headed in the appropriate direction based on what I am seeing currently. I do not see any evidence of active infection systemically which is great news. 07/16/2021 upon evaluation today patient appears to be doing pretty well currently in regard to her leg ulcer. This is measuring smaller and looking much better this is great news. Fortunately there does not appear to be any evidence of active infection systemically which is great news [name], [name] ([id number]) as well. 07/23/2021 upon evaluation today patient's wound is actually showing signs of good improvement this is definitely measuring smaller. I do not see any signs of infection which is great news and overall very pleased in that regard. Overall I think that she is doing well with the [facility]. 07/30/2020 upon evaluation today patient appears to be doing well in regard to her wound. She has been tolerating the dressing changes without complication think the Hydrofera Blue at this point is actually causing her to dry out a lot as far as the wound bed is concerned. Subsequently I think that we need to try to see what we can do to improve this.  Fortunately there does not appear to be any evidence of active infection at this time which is great news. 08/06/2021 upon evaluation today patient appears to be doing well with regards to her wound. Is not measuring significantly smaller initial inspection but upon closer inspection she has a lot of new skin growth across the central portion of the wound. I think will get very close to complete resolution. 08/13/2021 upon evaluation today patient appears to be doing well with regard to her wound. In fact this appears to be I believe healed although there is still some question as to whether it is completely so. I am concerned about the fact that to be honest she still has some evidence of dry skin that could be just trapping something underneath I still want to debride anything as I am afraid of causing some damage to the good skin for that reason I Georgina Peer probably monitor for 1 more week before completely closing everything out. 08/20/2021 upon evaluation today patient actually appears to be doing excellent. In fact she appears to be completely healed. This was the case last week as well we just wanted to make sure nothing reopened since she does not really have an ability to put on compression socks this is good to be something we need to make sure was well-healed. Nonetheless I think we have achieved that goal as of today. Objective Constitutional Well-nourished and well-hydrated in no acute distress. Vitals Time Taken: 3:55 PM, Height: 69 in, Weight: 244 lbs, BMI: 36, Temperature: 98.2 F, Pulse: 89 bpm, Respiratory Rate: 18 breaths/min, Blood Pressure: 206/99 mmHg. Respiratory normal breathing without difficulty. Psychiatric this patient is able to make decisions and demonstrates good insight into disease process. Alert and Oriented x 3. pleasant and cooperative. General  Notes: Upon inspection patient's wound bed actually showed signs of good granulation and epithelization at this point.  Fortunately I do not see any evidence of opening which is great and overall I am extremely pleased in that regard. No fevers, chills, nausea, vomiting, or diarrhea. Integumentary (Hair, Skin) Wound #2 status is Open. Original cause of wound was Gradually Appeared. The date acquired was: 09/08/2020. The wound has been in treatment 30 weeks. The wound is located on the Left,Lateral Lower Leg. The wound measures 0cm length x 0cm width x 0cm depth; 0cm^2 area and 0cm^3 volume. There is no tunneling or undermining noted. There is a none present amount of drainage noted. There is no granulation within the wound bed. There is no necrotic tissue within the wound bed. 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 Put garments on first thing when you wake up and remove them before bed. Moisturize legs daily after removing compression garments. Elevate, Exercise Daily and Avoid Standing for Long Periods of Time. 1. I am going to recommend currently that we going to continue with the wound care measures as before and the patient is in agreement the plan. This includes the use of elevating her legs much as possible unfortunately she just cannot wear compression socks she has nobody to help her get him on which is quite unfortunate I am not sure what else to do in that regard. 2. I am also can recommend if anything changes that she contact the office and let me know as soon as possible especially if she starts to develop any weeping or drainage. We will see her back for follow-up visit as needed. Electronic Signature(s) Signed: 08/20/2021 4:41:59 PM By:  [name] Keeler PA-C Entered By: [name] on 08/20/2021 16:41:59 [name] ([id number]) -------------------------------------------------------------------------------- SuperBill Details Patient Name: [name] Date of Service: 08/20/2021 Medical Record Number: [id number] Patient Account Number: [id number] Date of Birth/Sex: Jun 22, 1946 (75 y.o. F) Treating RN: [name] Primary Care Provider: Tomasa [name] Other Clinician: Referring Provider: Tomasa [name] Treating Provider/Extender: [name] in Treatment: 30 Diagnosis Coding ICD-10 Codes Code Description I89.0 Lymphedema, not elsewhere classified I87.332 Chronic venous hypertension (idiopathic) with ulcer and inflammation of left lower extremity L97.828 Non-pressure chronic ulcer of other part of left lower leg with other specified severity Facility Procedures CPT4 Code: [id number] Description: [id number] - WOUND CARE VISIT-LEV 2 EST PT Modifier: Quantity: 1 Physician Procedures CPT4 Code Description: [id number] - WC PHYS LEVEL 3 - EST PT Modifier: Quantity: 1 CPT4 Code Description: ICD-10 Diagnosis Description I89.0 Lymphedema, not elsewhere classified I87.332 Chronic venous hypertension (idiopathic) with ulcer and inflammation of le L97.828 Non-pressure chronic ulcer of other part of left lower leg with  other spec Modifier: ft lower extremity ified severity Quantity: Electronic Signature(s) Signed: 08/20/2021 4:42:40 PM By: [name] Keeler PA-C Previous Signature: 08/20/2021 3:17:31 PM Version By: [name] Coria RN Entered By: [name] on 08/20/2021 16:42:40

## 2021-08-21 ENCOUNTER — Inpatient Hospital Stay (HOSPITAL_BASED_OUTPATIENT_CLINIC_OR_DEPARTMENT_OTHER): Payer: Medicare Other | Admitting: Hospice and Palliative Medicine

## 2021-08-21 ENCOUNTER — Other Ambulatory Visit: Payer: Self-pay

## 2021-08-21 ENCOUNTER — Inpatient Hospital Stay: Payer: Medicare Other | Attending: Hospice and Palliative Medicine

## 2021-08-21 VITALS — BP 178/82 | HR 58 | Temp 97.9°F | Resp 18

## 2021-08-21 DIAGNOSIS — Z79811 Long term (current) use of aromatase inhibitors: Secondary | ICD-10-CM | POA: Diagnosis not present

## 2021-08-21 DIAGNOSIS — C50911 Malignant neoplasm of unspecified site of right female breast: Secondary | ICD-10-CM | POA: Diagnosis not present

## 2021-08-21 DIAGNOSIS — N63 Unspecified lump in unspecified breast: Secondary | ICD-10-CM | POA: Diagnosis not present

## 2021-08-21 DIAGNOSIS — Z171 Estrogen receptor negative status [ER-]: Secondary | ICD-10-CM | POA: Insufficient documentation

## 2021-08-21 LAB — CBC WITH DIFFERENTIAL/PLATELET
Abs Immature Granulocytes: 0.03 10*3/uL (ref 0.00–0.07)
Basophils Absolute: 0 10*3/uL (ref 0.0–0.1)
Basophils Relative: 0 %
Eosinophils Absolute: 0.1 10*3/uL (ref 0.0–0.5)
Eosinophils Relative: 1 %
HCT: 35 % — ABNORMAL LOW (ref 36.0–46.0)
Hemoglobin: 11.1 g/dL — ABNORMAL LOW (ref 12.0–15.0)
Immature Granulocytes: 0 %
Lymphocytes Relative: 15 %
Lymphs Abs: 1.2 10*3/uL (ref 0.7–4.0)
MCH: 27.6 pg (ref 26.0–34.0)
MCHC: 31.7 g/dL (ref 30.0–36.0)
MCV: 87.1 fL (ref 80.0–100.0)
Monocytes Absolute: 0.8 10*3/uL (ref 0.1–1.0)
Monocytes Relative: 10 %
Neutro Abs: 6 10*3/uL (ref 1.7–7.7)
Neutrophils Relative %: 74 %
Platelets: 271 10*3/uL (ref 150–400)
RBC: 4.02 MIL/uL (ref 3.87–5.11)
RDW: 13.2 % (ref 11.5–15.5)
WBC: 8.1 10*3/uL (ref 4.0–10.5)
nRBC: 0 % (ref 0.0–0.2)

## 2021-08-21 LAB — COMPREHENSIVE METABOLIC PANEL
ALT: 12 U/L (ref 0–44)
AST: 20 U/L (ref 15–41)
Albumin: 4 g/dL (ref 3.5–5.0)
Alkaline Phosphatase: 65 U/L (ref 38–126)
Anion gap: 10 (ref 5–15)
BUN: 30 mg/dL — ABNORMAL HIGH (ref 8–23)
CO2: 27 mmol/L (ref 22–32)
Calcium: 9.3 mg/dL (ref 8.9–10.3)
Chloride: 103 mmol/L (ref 98–111)
Creatinine, Ser: 1.1 mg/dL — ABNORMAL HIGH (ref 0.44–1.00)
GFR, Estimated: 52 mL/min — ABNORMAL LOW (ref >60)
Glucose, Bld: 90 mg/dL (ref 70–99)
Potassium: 4 mmol/L (ref 3.5–5.1)
Sodium: 140 mmol/L (ref 135–145)
Total Bilirubin: 0.7 mg/dL (ref 0.3–1.2)
Total Protein: 8.1 g/dL (ref 6.5–8.1)

## 2021-08-21 NOTE — Progress Notes (Signed)
Pt reports that right breast has been itching and appears more swollen than the left. Reports symptoms started a little over a week ago. Reports mild pain to the area. Denies nausea, vomiting, or fever.

## 2021-08-21 NOTE — Progress Notes (Signed)
Symptom Management [place] at [facility] Telephone:([phone]) [phone] [phone]) [phone]  Patient Care Team: Donnie Coffin, MD as PCP - General (Family Medicine) Minna Merritts, MD as Consulting Physician (Cardiology)   Name of the patient: [name]  [id number]  [birth year]   Date of visit: 08/21/21  Reason for Consult:  [name] is a 74 year old woman with multiple medical problems including a history of left breast cancer which was initially diagnosed in 2006 for which she underwent lumpectomy.  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  She has since been in remission.  Patient started Arimidex in December 2019 with reported compliance.  Patient is followed by Dr. Janese Banks but last saw Rulon Abide, NP on 02/22/2021.  At that time, patient appeared to be doing reasonably well without signs or symptoms of recurrence.  Subsequent mammogram in September 2022 was unremarkable with radiological recommendation for follow-up in 1 year.  Patient last saw Dr. Hampton Abbot on 06/10/2021 with report of intermittent right upper breast soreness but appeared to be doing well otherwise.  Dr. Hampton Abbot explained to her that soreness could be related to residual changes from radiation or scarring or could be musculoskeletal as she is in a wheelchair.  Patient was advised to follow-up with Dr. Hampton Abbot if the pain worsened or did not improve.  Patient requested [facility] visit for evaluation of breast pain.  Patient reports roughly 1 week of right breast discomfort at lumpectomy site.  She denies redness but feels like the breast is slightly more swollen.  Symptoms started with some nipple itching but that has resolved.  She denies fever or chills.  No changes or concerns to left breast.  No dimpling or nipple drainage/discharge reported.  No fever or  chills.  Denies any neurologic complaints. Denies recent fevers or illnesses. Denies any easy bleeding or bruising. Reports good appetite and denies weight loss. Denies chest pain. Denies any nausea, vomiting, constipation, or diarrhea. Denies urinary complaints. Patient offers no further specific complaints today.    PAST MEDICAL HISTORY: Past Medical History:  Diagnosis Date   Abnormality of gait    Breast cancer ([place]) 02/05/2005   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    Breast cancer ([place]) 2019   right breast   CHF (congestive heart failure) ([place])    March 28, 2017 echo: EF 50-55%; 1) echo 07/2011: EF 20-25%, mild concentric hypertrophy, diffuse HK, regional WMA cannot be excluded, grade 1 DD, mitral valve with mild regurg, LA mildly dilated). 2) EF 40-45%, mild AS,                     Complication of anesthesia 2006   pt states "hard to wake up" after L breast surgery    Edema    GERD (gastroesophageal reflux disease)    Headache    Heart murmur    Hypertension    Hypokinesis    global, EF 35-45 % from Echo.   Malignant neoplasm of breast (female), unspecified site 06/14/2018   RIGHT T1b, N0; ER/PR positive, HER-2 negative.   Neuropathy    Obesity    Pain in joint, site unspecified    Personal history of chemotherapy 2006   left breast   Personal history of radiation therapy 2019   right breast   Personal history  of radiation therapy 2006   left breast   Pneumonia    Sleep apnea    Tinea pedis    Unspecified hearing loss    bilateral    PAST SURGICAL HISTORY:  Past Surgical History:  Procedure Laterality Date   BREAST BIOPSY Right 2019   11 oc-INVASIVE MAMMARY CARCINOMA, NO SPECIAL TYPE   BREAST BIOPSY Right 2019   1030 oc- neg   BREAST LUMPECTOMY  2006   left breast    CATARACT EXTRACTION     left eye   HERNIA REPAIR  05/25/2006   Incarcerated omentum in ventral hernia,  Ventralight mesh, combined lap/ open with omental resection.    PARTIAL MASTECTOMY WITH NEEDLE LOCALIZATION Right 06/14/2018   Procedure: PARTIAL MASTECTOMY WITH NEEDLE LOCALIZATION;  Surgeon: Robert Bellow, MD;  Location: [facility] ORS;  Service: General;  Laterality: Right;   PORT-A-CATH REMOVAL  05/25/2006   SENTINEL NODE BIOPSY Right 06/14/2018   Procedure: SENTINEL NODE BIOPSY;  Surgeon: Robert Bellow, MD;  Location: [facility] ORS;  Service: General;  Laterality: Right;   TUBAL LIGATION      HEMATOLOGY/ONCOLOGY HISTORY:  Oncology History  Malignant neoplasm of right female breast ([place])  05/13/2018 Initial Diagnosis   Malignant neoplasm of right female breast ([place])   05/13/2018 Cancer Staging   Staging form: Breast, AJCC 8th Edition - Clinical stage from 05/13/2018: Stage IA (cT1b, cN0, cM0, G1, ER+, PR+, HER2-) - Signed by Sindy Guadeloupe, MD on 05/13/2018    Breast cancer ([place])  09/08/2004 Initial Diagnosis   Breast cancer ([place])   06/24/2018 Cancer Staging   Staging form: Breast, AJCC 6th Edition - Pathologic stage from 06/24/2018: Stage I (T1b, N0, M0) - Signed by Sindy Guadeloupe, MD on 06/26/2018      ALLERGIES:  has No Known Allergies.  MEDICATIONS:  Current Outpatient Medications  Medication Sig Dispense Refill   anastrozole (ARIMIDEX) 1 MG tablet TAKE 1 TABLET BY MOUTH DAILY. PATIENT MUST MAKE AN APPT TO SEE DR RAO 90 tablet 0   aspirin EC 81 MG tablet Take 81 mg by mouth daily.     cloNIDine (CATAPRES) 0.2 MG tablet Take 1 tablet (0.2 mg total) by mouth 2 (two) times daily. 180 tablet 4   clotrimazole (LOTRIMIN) 1 % cream Apply 1 application topically 2 (two) times daily.   1   [employer] CALCIUM 600 & VITAMIN D3 600-800 MG-UNIT TABS TAKE 1 TABLET BY MOUTH TWICE A DAY 180 tablet 2   diclofenac Sodium (VOLTAREN) 1 % GEL Apply 1 application topically as needed.     Incontinence Supply Disposable (BLADDER CONTROL PADS EX ABSORB) MISC Apply 1 Dose topically as needed.      isosorbide  mononitrate (IMDUR) 60 MG 24 hr tablet Take 1 tablet (60 mg total) by mouth 2 (two) times daily. 180 tablet 3   loratadine (CLARITIN) 10 MG tablet Take 10 mg by mouth daily.     losartan (COZAAR) 25 MG tablet Take 1 tablet (25 mg total) by mouth daily. 90 tablet 3   methocarbamol (ROBAXIN) 500 MG tablet Take 500 mg by mouth 2 (two) times daily as needed.     MIRALAX 17 GM/SCOOP powder SMARTSIG:2 Scoopful By Mouth Daily PRN     omeprazole (PRILOSEC) 40 MG capsule Take 40 mg by mouth daily.     oxybutynin (DITROPAN) 5 MG tablet Take 5 mg by mouth 2 (two) times daily.     potassium chloride SA (KLOR-CON) 20 MEQ tablet Take 1 tablet (  20 mEq total) by mouth 2 (two) times daily. 180 tablet 3   Torsemide 40 MG TABS Take 40 mg by mouth 2 (two) times daily. 270 tablet 3   triamcinolone cream (KENALOG) 0.1 % Apply 1 application topically 2 (two) times daily. Back of legs     No current facility-administered medications for this visit.    VITAL SIGNS: There were no vitals taken for this visit. There were no vitals filed for this visit.  Estimated body mass index is 40.77 kg/m as calculated from the following:   Height as of 08/05/21: 5' 5" (1.651 m).   Weight as of 08/05/21: 245 lb (111.1 kg).  LABS: CBC:    Component Value Date/Time   WBC 5.7 02/22/2021 0954   HGB 11.2 (L) 02/22/2021 0954   HGB 11.8 (L) 03/23/2014 0518   HCT 35.2 (L) 02/22/2021 0954   HCT 32.8 (L) 03/22/2014 0403   PLT 273 02/22/2021 0954   PLT 252 03/23/2014 0518   MCV 87.8 02/22/2021 0954   MCV 88 03/22/2014 0403   NEUTROABS 3.7 02/22/2021 0954   NEUTROABS 4.2 03/22/2014 0403   LYMPHSABS 1.3 02/22/2021 0954   LYMPHSABS 1.1 03/22/2014 0403   MONOABS 0.5 02/22/2021 0954   MONOABS 0.7 03/22/2014 0403   EOSABS 0.2 02/22/2021 0954   EOSABS 0.0 03/22/2014 0403   BASOSABS 0.0 02/22/2021 0954   BASOSABS 0.0 03/22/2014 0403   Comprehensive Metabolic Panel:    Component Value Date/Time   NA 142 07/04/2021 1503   NA  143 03/22/2014 0403   K 3.8 07/04/2021 1503   K 3.4 (L) 03/22/2014 0403   CL 103 07/04/2021 1503   CL 107 03/22/2014 0403   CO2 25 07/04/2021 1503   CO2 32 03/22/2014 0403   BUN 27 07/04/2021 1503   BUN 17 03/22/2014 0403   CREATININE 1.16 (H) 07/04/2021 1503   CREATININE 0.99 03/22/2014 0403   GLUCOSE 205 (H) 07/04/2021 1503   GLUCOSE 90 02/22/2021 0954   GLUCOSE 88 03/22/2014 0403   CALCIUM 9.1 07/04/2021 1503   CALCIUM 8.2 (L) 03/22/2014 0403   AST 18 02/22/2021 0954   AST 22 08/22/2012 1145   ALT 15 02/22/2021 0954   ALT 17 08/22/2012 1145   ALKPHOS 55 02/22/2021 0954   ALKPHOS 71 08/22/2012 1145   BILITOT 0.6 02/22/2021 0954   BILITOT 0.5 08/22/2012 1145   PROT 7.7 02/22/2021 0954   PROT 8.3 (H) 08/22/2012 1145   ALBUMIN 3.7 02/22/2021 0954   ALBUMIN 3.6 08/22/2012 1145    RADIOGRAPHIC STUDIES: No results found.  PERFORMANCE STATUS (ECOG) : 3 - Symptomatic, >50% confined to bed  Review of Systems Unless otherwise noted, a complete review of systems is negative.  Physical Exam General: NAD, obese, in wheelchair Pulmonary: Unlabored Extremities: no edema, no joint deformities Skin: no rashes Neurological: Weakness but otherwise nonfocal Breast: Right breast appears larger than left but without erythema, dimpling, wounds, or nipple inversion/drainage.  No masses palpated in either breast although exam was limited as patient was unable to get on the exam table and exam was performed in the wheelchair.  Patient did have tenderness to palpate over the previous lumpectomy scar  Exam was chaperoned Benjie Karvonen, Therapist, sports.     Assessment and Plan- Patient is a 75 y.o. female with multiple medical problems including a history of left breast cancer which was initially diagnosed in 2006 for which she underwent lumpectomy.  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  She has since been in remission.  Patient started Arimidex in December 2019 with reported compliance.  Patient presents to [facility] for evaluation of right breast pain   R. Breast Swelling/pain -pain seems to be localized to lumpectomy scar.  I do not see redness and breast does not appear warm.  Will hold on antibiotics unless needed for worsening symptoms. I suspect swelling could be a component of lymphedema.  Will obtain ultrasound/diagnostic mammogram for further evaluation.  I will also reach out to Dr. Hampton Abbot.  We will also refer patient to [facility] for lymphedema education. Patient says she has Norco at home to take as needed for pain.   RTC in 1-2 weeks for NP follow up or sooner if symptoms worsening or persist without improvement   Patient expressed understanding and was in agreement with this plan. She also understands that She can call clinic at any time with any questions, concerns, or complaints.   Thank you for allowing me to participate in the care of this very pleasant patient.   Time Total: 15 minutes  Visit consisted of counseling and education dealing with the complex and emotionally intense issues of symptom management in the setting of serious illness.Greater than 50%  of this time was spent counseling and coordinating care related to the above assessment and plan.  Signed by: Altha Harm, PhD, NP-C

## 2021-08-23 ENCOUNTER — Ambulatory Visit: Payer: Self-pay | Admitting: Nurse Practitioner

## 2021-08-23 ENCOUNTER — Other Ambulatory Visit: Payer: Medicare HMO

## 2021-08-27 ENCOUNTER — Encounter: Payer: Medicare Other | Admitting: Physician Assistant

## 2021-08-29 ENCOUNTER — Telehealth: Payer: Self-pay | Admitting: Oncology

## 2021-08-29 ENCOUNTER — Other Ambulatory Visit: Payer: Medicare Other

## 2021-08-29 ENCOUNTER — Inpatient Hospital Stay: Admission: RE | Admit: 2021-08-29 | Payer: Medicare Other | Source: Ambulatory Visit

## 2021-08-29 NOTE — Telephone Encounter (Signed)
Spoke with patient's daughter about patient's missed mammogram today. Patient stated that the transportation set up for her did not work for her wheelchair and she was unable to get in the car. Her daughter requested that we reschedule the mammogram and daughter will arrange for her to be there.

## 2021-08-31 ENCOUNTER — Other Ambulatory Visit: Payer: Self-pay | Admitting: Oncology

## 2021-09-03 ENCOUNTER — Encounter: Payer: Medicare Other | Admitting: Internal Medicine

## 2021-09-03 ENCOUNTER — Other Ambulatory Visit: Payer: Self-pay

## 2021-09-03 ENCOUNTER — Telehealth: Payer: Self-pay | Admitting: Oncology

## 2021-09-03 DIAGNOSIS — I87332 Chronic venous hypertension (idiopathic) with ulcer and inflammation of left lower extremity: Secondary | ICD-10-CM | POA: Diagnosis not present

## 2021-09-03 NOTE — Telephone Encounter (Signed)
Attempt made to reach patient on her Mobile--unable to leave a message. Left a VM on her daughter's phone about the rescheduled appointment for her mammogram and to see the NP. Patient was unable to get this done last week as she could not transfer out of her wheelchair. We have contacted [facility] to confirm that she is scheduled as a wheelchair patient in the future. Requested patient call back to confirm.

## 2021-09-04 ENCOUNTER — Telehealth: Payer: Self-pay | Admitting: Oncology

## 2021-09-04 NOTE — Telephone Encounter (Signed)
Left VM with patient with information on her rescheduled Mammogram. Wheelchair transportation as been arranged with [facility].

## 2021-09-05 ENCOUNTER — Inpatient Hospital Stay: Payer: Medicare Other | Admitting: Nurse Practitioner

## 2021-09-05 ENCOUNTER — Telehealth: Payer: Self-pay | Admitting: Oncology

## 2021-09-05 ENCOUNTER — Inpatient Hospital Stay: Payer: Medicare Other

## 2021-09-05 NOTE — Telephone Encounter (Signed)
Attempt made to reach patient. Left VM with direct extension.

## 2021-09-06 ENCOUNTER — Telehealth: Payer: Self-pay | Admitting: Nurse Practitioner

## 2021-09-06 NOTE — Progress Notes (Signed)
VYLET, MAFFIA ([id number]) Visit Report for 09/03/2021 Arrival Information Details Patient Name: SYRIANA, CROSLIN Date of Service: 09/03/2021 2:00 PM Medical Record Number: [id number] Patient Account Number: [id number] Date of Birth/Sex: Dec 21, 1945 (75 y.o. F) Treating RN: Carlene Coria Primary Care Camila Maita: [name] Other Clinician: Referring Yissel Habermehl: [name] Treating Fiana Gladu/Extender: Tito Dine in Treatment: 66 Visit Information History Since Last Visit All ordered tests and consults were completed: No Patient Arrived: Wheel Chair Added or deleted any medications: No Arrival Time: 14:24 Any new allergies or adverse reactions: No Accompanied By: self Had a fall or experienced change in No Transfer Assistance: None activities of daily living that may affect Patient Identification Verified: Yes risk of falls: Secondary Verification Process Completed: Yes Signs or symptoms of abuse/neglect since last visito No Patient Requires Transmission-Based Precautions: No Hospitalized since last visit: No Patient Has Alerts: Yes Implantable device outside of the clinic excluding No Patient Alerts: NOT DIABETIC cellular tissue based products placed in the center since last visit: Has Dressing in Place as Prescribed: Yes Pain Present Now: No Electronic Signature(s) Signed: 09/06/2021 4:17:41 PM By: Carlene Coria RN Entered By: Carlene Coria on 09/03/2021 14:24:57 [name] ([id number]) -------------------------------------------------------------------------------- Clinic Level of Care Assessment Details Patient Name: [name] Date of Service: 09/03/2021 2:00 PM Medical Record Number: [id number] Patient Account Number: [id number] Date of Birth/Sex: 12-16-1945 (75 y.o. F) Treating RN: Carlene Coria Primary Care Zarin Knupp: [name] Other Clinician: Referring Vincent Streater: [name] Treating Ayda Tancredi/Extender: Tito Dine in  Treatment: 32 Clinic Level of Care Assessment Items TOOL 4 Quantity Score []  - Use when only an EandM is performed on FOLLOW-UP visit 0 ASSESSMENTS - Nursing Assessment / Reassessment []  - Reassessment of Co-morbidities (includes updates in patient status) 0 []  - 0 Reassessment of Adherence to Treatment Plan ASSESSMENTS - Wound and Skin Assessment / Reassessment []  - Simple Wound Assessment / Reassessment - one wound 0 []  - 0 Complex Wound Assessment / Reassessment - multiple wounds []  - 0 Dermatologic / Skin Assessment (not related to wound area) ASSESSMENTS - Focused Assessment []  - Circumferential Edema Measurements - multi extremities 0 []  - 0 Nutritional Assessment / Counseling / Intervention []  - 0 Lower Extremity Assessment (monofilament, tuning fork, pulses) []  - 0 Peripheral Arterial Disease Assessment (using hand held doppler) ASSESSMENTS - Ostomy and/or Continence Assessment and Care []  - Incontinence Assessment and Management 0 []  - 0 Ostomy Care Assessment and Management (repouching, etc.) PROCESS - Coordination of Care []  - Simple Patient / Family Education for ongoing care 0 []  - 0 Complex (extensive) Patient / Family Education for ongoing care []  - 0 Staff obtains Programmer, systems, Records, Test Results / Process Orders []  - 0 Staff telephones HHA, Nursing Homes / Clarify orders / etc []  - 0 Routine Transfer to another Facility (non-emergent condition) []  - 0 Routine Hospital Admission (non-emergent condition) []  - 0 New Admissions / Biomedical engineer / Ordering NPWT, Apligraf, etc. []  - 0 Emergency Hospital Admission (emergent condition) []  - 0 Simple Discharge Coordination []  - 0 Complex (extensive) Discharge Coordination PROCESS - Special Needs []  - Pediatric / Minor Patient Management 0 []  - 0 Isolation Patient Management []  - 0 Hearing / Language / Visual special needs []  - 0 Assessment of Community assistance (transportation, D/C planning,  etc.) []  - 0 Additional assistance / Altered mentation []  - 0 Support Surface(s) Assessment (bed, cushion, seat, etc.) INTERVENTIONS - Wound Cleansing / Measurement Doggett, Jecenia ([id number]) []  - 0 Simple Wound Cleansing - one wound []  -  0 Complex Wound Cleansing - multiple wounds []  - 0 Wound Imaging (photographs - any number of wounds) []  - 0 Wound Tracing (instead of photographs) []  - 0 Simple Wound Measurement - one wound []  - 0 Complex Wound Measurement - multiple wounds INTERVENTIONS - Wound Dressings []  - Small Wound Dressing one or multiple wounds 0 []  - 0 Medium Wound Dressing one or multiple wounds []  - 0 Large Wound Dressing one or multiple wounds []  - 0 Application of Medications - topical []  - 0 Application of Medications - injection INTERVENTIONS - Miscellaneous []  - External ear exam 0 []  - 0 Specimen Collection (cultures, biopsies, blood, body fluids, etc.) []  - 0 Specimen(s) / Culture(s) sent or taken to Lab for analysis []  - 0 Patient Transfer (multiple staff / Civil Service fast streamer / Similar devices) []  - 0 Simple Staple / Suture removal (25 or less) []  - 0 Complex Staple / Suture removal (26 or more) []  - 0 Hypo / Hyperglycemic Management (close monitor of Blood Glucose) []  - 0 Ankle / Brachial Index (ABI) - do not check if billed separately []  - 0 Vital Signs Has the patient been seen at the hospital within the last three years: Yes Total Score: 0 Level Of Care: ____ Electronic Signature(s) Signed: 09/06/2021 4:17:41 PM By: Carlene Coria RN Entered By: Carlene Coria on 09/03/2021 14:58:56 [name] ([id number]) -------------------------------------------------------------------------------- Compression Therapy Details Patient Name: [name] Date of Service: 09/03/2021 2:00 PM Medical Record Number: [id number] Patient Account Number: [id number] Date of Birth/Sex: May 30, 1946 (75 y.o. F) Treating RN: Carlene Coria Primary Care  Aireonna Bauer: [name] Other Clinician: Referring Levante Simones: [name] Treating Thurmond Hildebran/Extender: Tito Dine in Treatment: 32 Compression Therapy Performed for Wound Assessment: Wound #3 Left Lower Leg Performed By: Clinician Carlene Coria, RN Compression Type: Three Layer Post Procedure Diagnosis Same as Pre-procedure Electronic Signature(s) Signed: 09/06/2021 4:17:41 PM By: Carlene Coria RN Entered By: Carlene Coria on 09/03/2021 14:49:43 [name] ([id number]) -------------------------------------------------------------------------------- Lower Extremity Assessment Details Patient Name: [name] Date of Service: 09/03/2021 2:00 PM Medical Record Number: [id number] Patient Account Number: [id number] Date of Birth/Sex: 06/24/1946 (75 y.o. F) Treating RN: Carlene Coria Primary Care Othell Jaime: [name] Other Clinician: Referring Syrus Nakama: [name] Treating Analea Muller/Extender: Tito Dine in Treatment: 32 Edema Assessment Assessed: [Left: No] [Right: No] Edema: [Left: Ye] [Right: s] Calf Left: Right: Point of Measurement: 27 cm From Medial Instep 50 cm Ankle Left: Right: Point of Measurement: 10 cm From Medial Instep 28 cm Vascular Assessment Pulses: Dorsalis Pedis Palpable: [Left:Yes] Electronic Signature(s) Signed: 09/06/2021 4:17:41 PM By: Carlene Coria RN Entered By: Carlene Coria on 09/03/2021 14:33:48 [name] ([id number]) -------------------------------------------------------------------------------- Multi Wound Chart Details Patient Name: [name] Date of Service: 09/03/2021 2:00 PM Medical Record Number: [id number] Patient Account Number: [id number] Date of Birth/Sex: 09-23-1945 (75 y.o. F) Treating RN: Carlene Coria Primary Care Birl Lobello: [name] Other Clinician: Referring Lev Cervone: [name] Treating Ayodele Sangalang/Extender: Tito Dine in Treatment: 32 Vital Signs Height(in):  69 Pulse(bpm): 75 Weight(lbs): 244 Blood Pressure(mmHg): 133/86 Body Mass Index(BMI): 36 Temperature(F): 98.4 Respiratory Rate(breaths/min): 18 Photos: [2:No Photos] [N/A:N/A] Wound Location: Left, Lateral Lower Leg Left Lower Leg N/A Wounding Event: Gradually Appeared Gradually Appeared N/A Primary Etiology: Venous Leg Ulcer Lymphedema N/A Comorbid History: N/A Cataracts, Lymphedema, Sleep N/A Apnea, Congestive Heart Failure, Hypertension, Osteoarthritis, Neuropathy, Received Chemotherapy, Received Radiation Date Acquired: 09/08/2020 08/15/2021 N/A Weeks of Treatment: 32 0 N/A Wound Status: Healed - Epithelialized Open N/A Measurements L x W x D (  cm) 0x0x0 9x8.5x0.1 N/A Area (cm) : 0 60.083 N/A Volume (cm) : 0 6.008 N/A Classification: Full Thickness Without Exposed Full Thickness Without Exposed N/A Support Structures Support Structures Exudate Amount: None Present Medium N/A Exudate Type: N/A Serosanguineous N/A Exudate Color: N/A red, brown N/A Granulation Amount: N/A Medium (34-66%) N/A Necrotic Amount: N/A Medium (34-66%) N/A Epithelialization: N/A None N/A Treatment Notes Electronic Signature(s) Signed: 09/06/2021 4:17:41 PM By: Carlene Coria RN Entered By: Carlene Coria on 09/03/2021 14:49:17 [name] ([id number]) -------------------------------------------------------------------------------- Multi-Disciplinary Care Plan Details Patient Name: [name] Date of Service: 09/03/2021 2:00 PM Medical Record Number: [id number] Patient Account Number: [id number] Date of Birth/Sex: April 07, 1946 (75 y.o. F) Treating RN: Carlene Coria Primary Care Ilyse Tremain: [name] Other Clinician: Referring Catalina Salasar: [name] Treating Carlicia Leavens/Extender: Tito Dine in Treatment: 32 Active Inactive Electronic Signature(s) Signed: 09/06/2021 4:17:41 PM By: Carlene Coria RN Entered By: Carlene Coria on 09/03/2021 14:49:04 [name]  ([id number]) -------------------------------------------------------------------------------- Pain Assessment Details Patient Name: [name] Date of Service: 09/03/2021 2:00 PM Medical Record Number: [id number] Patient Account Number: [id number] Date of Birth/Sex: 1945/10/01 (75 y.o. F) Treating RN: Carlene Coria Primary Care Madisun Hargrove: [name] Other Clinician: Referring Maripat Borba: [name] Treating Decarla Siemen/Extender: Tito Dine in Treatment: 32 Active Problems Location of Pain Severity and Description of Pain Patient Has Paino No Site Locations Pain Management and Medication Current Pain Management: Electronic Signature(s) Signed: 09/06/2021 4:17:41 PM By: Carlene Coria RN Entered By: Carlene Coria on 09/03/2021 14:28:19 [name] ([id number]) -------------------------------------------------------------------------------- Patient/Caregiver Education Details Patient Name: [name] Date of Service: 09/03/2021 2:00 PM Medical Record Number: [id number] Patient Account Number: [id number] Date of Birth/Gender: [birth year] (75 y.o. F) Treating RN: Carlene Coria Primary Care Physician: [name] Other Clinician: Referring Physician: [name] Treating Physician/Extender: Tito Dine in Treatment: 74 Education Assessment Education Provided To: Patient Education Topics Provided Wound/Skin Impairment: Methods: Explain/Verbal Responses: State content correctly Electronic Signature(s) Signed: 09/06/2021 4:17:41 PM By: Carlene Coria RN Entered By: Carlene Coria on 09/03/2021 14:59:17 [name] ([id number]) -------------------------------------------------------------------------------- Wound Assessment Details Patient Name: [name] Date of Service: 09/03/2021 2:00 PM Medical Record Number: [id number] Patient Account Number: [id number] Date of Birth/Sex: 1946/04/19 (75 y.o. F) Treating RN: Carlene Coria Primary  Care Alexandar Weisenberger: [name] Other Clinician: Referring Keyuana Wank: [name] Treating Tracy Gerken/Extender: Tito Dine in Treatment: 32 Wound Status Wound Number: 2 Primary Etiology: Venous Leg Ulcer Wound Location: Left, Lateral Lower Leg Wound Status: Healed - Epithelialized Wounding Event: Gradually Appeared Date Acquired: 09/08/2020 Weeks Of Treatment: 32 Clustered Wound: No Wound Measurements Length: (cm) Width: (cm) Depth: (cm) Area: (cm) Volume: (cm) 0 % Reduction in Area: 0 % Reduction in Volume: 0 0 0 Wound Description Classification: Full Thickness Without Exposed Support Structure Exudate Amount: None Present s Electronic Signature(s) Signed: 09/06/2021 4:17:41 PM By: Carlene Coria RN Entered By: Carlene Coria on 09/03/2021 14:28:36 [name] ([id number]) -------------------------------------------------------------------------------- Wound Assessment Details Patient Name: [name] Date of Service: 09/03/2021 2:00 PM Medical Record Number: [id number] Patient Account Number: [id number] Date of Birth/Sex: 1946-04-20 (75 y.o. F) Treating RN: Carlene Coria Primary Care Cass Edinger: [name] Other Clinician: Referring Emberlie Gotcher: [name] Treating Rhiana Morash/Extender: Tito Dine in Treatment: 32 Wound Status Wound Number: 3 Primary Lymphedema Etiology: Wound Location: Left Lower Leg Wound Open Wounding Event: Gradually Appeared Status: Date Acquired: 08/15/2021 Comorbid Cataracts, Lymphedema, Sleep Apnea, Congestive Heart Weeks Of Treatment: 0 History: Failure, Hypertension, Osteoarthritis, Neuropathy, Clustered Wound: No Received Chemotherapy, Received Radiation Photos Wound Measurements Length: (cm)  9 Width: (cm) 8.5 Depth: (cm) 0.1 Area: (cm) 60.083 Volume: (cm) 6.008 % Reduction in Area: % Reduction in Volume: Epithelialization: None Tunneling: No Undermining: No Wound Description Classification: Full  Thickness Without Exposed Support Structu Exudate Amount: Medium Exudate Type: Serosanguineous Exudate Color: red, brown res Foul Odor After Cleansing: No Slough/Fibrino No Wound Bed Granulation Amount: Medium (34-66%) Exposed Structure Necrotic Amount: Medium (34-66%) Fascia Exposed: No Fat Layer (Subcutaneous Tissue) Exposed: Yes Tendon Exposed: No Muscle Exposed: No Joint Exposed: No Bone Exposed: No Electronic Signature(s) Signed: 09/06/2021 4:17:41 PM By: Carlene Coria RN Entered By: Carlene Coria on 09/03/2021 14:33:03 [name] ([id number]) -------------------------------------------------------------------------------- Vitals Details Patient Name: [name] Date of Service: 09/03/2021 2:00 PM Medical Record Number: [id number] Patient Account Number: [id number] Date of Birth/Sex: Jan 31, 1946 (75 y.o. F) Treating RN: Carlene Coria Primary Care Lukas Pelcher: [name] Other Clinician: Referring Amely Voorheis: [name] Treating Jhalil Silvera/Extender: Tito Dine in Treatment: 32 Vital Signs Time Taken: 14:25 Temperature (F): 98.4 Height (in): 69 Pulse (bpm): 78 Weight (lbs): 244 Respiratory Rate (breaths/min): 18 Body Mass Index (BMI): 36 Blood Pressure (mmHg): 133/86 Reference Range: 80 - 120 mg / dl Electronic Signature(s) Signed: 09/06/2021 4:17:41 PM By: Carlene Coria RN Entered By: Carlene Coria on 09/03/2021 14:28:11

## 2021-09-06 NOTE — Progress Notes (Signed)
[name], [name] ([id number]) Visit Report for 09/03/2021 HPI Details Patient Name: [name], [name] Date of Service: 09/03/2021 2:00 PM Medical Record Number: [id number] Patient Account Number: [id number] Date of Birth/Sex: 1946-05-02 (75 y.o. F) Treating RN: Carlene Coria Primary Care Provider: [name] Other Clinician: Referring Provider: [name] Treating Provider/Extender: Tito Dine in Treatment: 32 History of Present Illness HPI Description: 03/15/2020 upon evaluation today patient presents today for initial evaluation here in our clinic concerning a wound that is on the left posterior lower extremity. Unfortunately this has been giving the patient some discomfort at this point which she notes has been affected her pretty much daily. The patient does have a history of venous insufficiency, hypertension, congestive heart failure, and a history of breast cancer. Fortunately there does not appear to be evidence of active infection at this time which is great news. No fevers, chills, nausea, vomiting, or diarrhea. Wound is not extremely large but does have some slough covering the surface of the wound. This is going require sharp debridement today. 03/15/2020 upon evaluation today patient appears to be doing fairly well in regard to her wound. She does have some slough noted on the surface of the wound currently but I do believe the Iodoflex has been beneficial over the past week. There is no signs of active infection at this time which is great news and overall very pleased with the progress. No fevers, chills, nausea, vomiting, or diarrhea. 04/02/2020 upon evaluation today patient appears to be doing a little better in regard to her wound size wise this is not tremendously smaller she does have some slough buildup on the surface of the wound. With that being said this is going require some sharp debridement today to clear away some of the necrotic debris. Fortunately there  is no evidence of active infection at this time. No fevers, chills, nausea, vomiting, or diarrhea. 04/10/2020 on evaluation today patient appears to be doing well with regard to her ulcer which is measuring somewhat smaller today. She actually has more granulation tissue noted which is also good news is no need for sharp debridement today. Fortunately there is no evidence of infection either which is also excellent. No fevers, chills, nausea, vomiting, or diarrhea. 04/26/2020 on evaluation today patient's wound actually is showing signs of good improvement which is great news. There does not appear to be any evidence of active infection. I do believe she is tolerating the collagen at this point. 05/03/2020 upon evaluation today patient's wound actually showed signs of good granulation at this time there does not appear to be any evidence of active infection which is great news and overall very pleased with where things stand. With that being said I do believe that the patient is can require some sharp debridement today but fortunately nothing too significant. 05/11/20 on evaluation today patient appears to be doing well in regard to her leg ulcer. She's been tolerating the dressing changes without complication. Fortunately there is no signs of active infection at this time. Overall I'm very pleased with where things stand. 05/18/2020 upon evaluation today patient's wound is showing signs of improvement is very dry and I think the collagen for the most part just dried to the wound bed. I am going to clean this away with sharp debridement in order to get this under control in my opinion. 05/25/2020 upon evaluation today patient actually appears to be doing well in regard to her leg ulcer. In fact upon inspection it appears she could potentially be  completely healed but that is not guaranteed based on what we are seeing. There does not appear to be any signs of active infection which is great news. 05/31/2020 on  evaluation today patient appears to be doing well with regard to her wounds. She in fact appears to be almost completely healed on the left leg there is just a very small opening remaining point 1 06/08/2020 upon evaluation today patient appears to be doing excellent in regard to her leg ulcer on the left in fact it appears to be that she is completely healed as of today. I see no signs of active infection at this time which is great news. No fevers, chills, nausea, vomiting, or diarrhea. READMISSION 01/16/2021 Patient that we have had in this clinic with a wound on the left posterior calf in the summer 2021 extending into October. She was felt to have chronic venous insufficiency. Per the patient's description she was discharged with what sounds like external compression stockings although she could not afford the "$75 per leg". She therefore did not get anything. Apparently her wound that she has currently is been in existence since January. She has been followed at vein and vascular with Unna boots and I think calcium alginate. She had ABIs done on 06/25/2020 that showed an noncompressible ABI on the right leg and the left leg but triphasic waveforms with good great toe pressures and a TBI of 0.90 on the right and 0.91 on the left Surprisingly the patient also had venous reflux studies that were really unremarkable. This included no evidence of DVTs or SVTs but there was no evidence of deep venous insufficiency or superficial venous insufficiency in the greater saphenous or short saphenous veins bilaterally. I would wonder about more central venous issues or perhaps this is all lymphedema 01/25/2021 upon evaluation today patient appears to be doing decently well and guard to her wound. The good news is the Iodoflex that is job she saw Dr. Dellia Nims last week for readmission and to be honest I think that this has done extremely well over that week. I do believe that she can tolerate the sharp  debridement today which will be good. I think that cleaning off the wound will likely allow [place] to be able to use a different type of dressing I do not think the Iodoflex will even be necessary based on how clean the wound looks. Fortunately there is no sign of active infection at this time which is great news. No fevers, chills, nausea, vomiting, or diarrhea. ZAKARIAH, DEJARNETTE ([id number]) 02/12/2021 upon evaluation today patient appears to be doing well with regard to her leg ulcer. We did switch to alginate when she came for nurse visit I think is doing much better for her. Fortunately there does not appear to be any signs of active infection at this time which is great news. No fevers, chills, nausea, vomiting, or diarrhea. 02/18/2021 upon evaluation today patient's wound is actually showing signs of good improvement. I am very pleased with where things stand currently. I do not see any signs of active infection which is great news and overall I feel like she is making good progress. The patient likewise is happy that things are doing so well and that this is measuring somewhat smaller. 02/25/2021 upon evaluation today patient actually appears to be doing quite well in regard to her wounds. She has been tolerating the dressing changes without complication. Fortunately there does not appear to be any signs of active infection which is great  news and overall I am extremely pleased with where things stand. No fevers, chills, nausea, vomiting, or diarrhea. She does have a lot of edema I really do think she needs compression socks to be worn in order to prevent things from causing additional issues for her currently. Especially on the right leg she should be wearing compression socks all the time to be honest. 03/04/2021 upon evaluation today patient's leg ulcer actually appears to be doing pretty well which is great news. There does not appear to be any significant change but overall the appearance is  better even though the size is not necessarily reflecting a great improvement. Fortunately there does not appear to be any signs of active infection. No fevers, chills, nausea, vomiting, or diarrhea. 03/12/2021 upon evaluation today patient appears to be doing well with regard to her wounds. She has been tolerating the dressing changes without complication. The good news is that the wound on her left lateral leg is doing much better and is showing signs of good improvement. Overall her swelling is controlled well as well. She does need some compression socks she plans to go Jefferey socks next week. 03/18/2021 on evaluation today patient's wound actually is showing signs of good improvement. She does have a little bit of slough this can require some sharp debridement today. 03/25/2021 upon evaluation today patient appears to be doing well with regard to her wound on the left lateral leg. She has been tolerating the dressing changes without complication. Fortunately there does not appear to be any signs of active infection at this time. No fever chills no 04/01/2021 upon evaluation today patient's wound is showing signs of improvement and overall very pleased with where things stand at this point. There is no evidence of active infection which is great news as well and in general I think that she is making great progress. 04/09/2021 upon evaluation today patient appears to be doing well with regard to her ankle ulcer. She is making good progress currently which is great news. There does not appear to be any signs of infection also excellent news. In general I am extremely pleased with where things stand today. No fevers, chills, nausea, vomiting, or diarrhea. 04/23/2021 upon inspection today patient appears to be doing quite well with regard to her leg ulcer. She has been tolerating the dressing changes without complication. She is can require some sharp debridement today but overall seems to be doing  excellent. 05/06/2021 upon evaluation today patient unfortunately appears to be doing poorly overall in regard to her swelling. She is actually significantly more swollen than last week despite the fact that her wound is doing better this is not a good trend. I think that she probably needs to see her cardiologist ASAP and I discussed that with her today. This includes the fact that honestly I think she probably is getting need an increase in her diuretic or something to try to clear away some of the excess fluid that she is experiencing at the moment. She is not been doing her pumps even twice a day but again right now more concerned that if she were to do the pump she would fluid overload her lungs causing other issues as far as her breathing is concerned. Obviously I think she needs to contact them and move up the appointment for the 12th of something sooner 2 weeks is a bit too long to wait with how swollen she has. 05/14/2021 upon evaluation today patient's wound actually showing signs of being  a little bit smaller compared to previous. She has been making good progress however. Fortunately there does not appear to be any evidence of active infection which is great. Overall I am extremely pleased in that regard. Nonetheless I am still concerned that she is having quite a bit of issue as far as the swelling is concerned she has been placed on fluid restriction as well as an increase in the furosemide by her doctor. We will see how things progress. 05/21/2021 upon evaluation today patient appears to be doing well with regard to her wound in general. She has been tolerating the dressing changes without complication. With that being said she has been using silver cell for a while and while this was showing signs of improvement it is now gotten to the point where this has slowed down quite significantly. I do believe that switching to a different dressing may be beneficial for her today. 05/28/2021 upon  evaluation today patient actually appears to be doing quite well in regard to her wound. In fact there is really not any need for sharp debridement today based on what I see. The Hydrofera Blue is doing great and overall I think that she is managing quite nicely with the compression wrapping. Her edema is still down quite a bit with the wrapping in place. 06/11/2021 upon evaluation today patient appears to be doing well with regard to her leg ulcer. She has been tolerating the dressing changes without complication. Fortunately there does not appear to be any signs of active infection at this time which is great news. No fevers, chills, nausea, vomiting, or diarrhea. She is going require some sharp debridement today. 10/11; left leg ulcer much better looking and with improved surface area. She is using Hydrofera Blue under 3 layer compression. She does not have any stocking on the right leg which in itself has very significant nonpitting edema 06/25/2021 upon evaluation today patient appears to be doing well with regard to the wound on her left leg. She has been tolerating the dressing changes without complication and overall I am extremely pleased with where things stand today. I do not see any evidence of infection which is great news. 07/02/2021 upon evaluation today patient's wound actually showing signs of excellent improvement and actually very pleased with where we stand today and the overall appearance. Fortunately there does not appear to be any signs of active infection. No fevers, chills, nausea, vomiting, or diarrhea. 07/09/2021 upon evaluation today patient appears to be doing better in regard to her wound this is measuring smaller and she is headed in the appropriate direction based on what I am seeing currently. I do not see any evidence of active infection systemically which is great news. 07/16/2021 upon evaluation today patient appears to be doing pretty well currently in regard to her  leg ulcer. This is measuring smaller and looking much better this is great news. Fortunately there does not appear to be any evidence of active infection systemically which is great news ISOLDE, SKAFF ([id number]) as well. 07/23/2021 upon evaluation today patient's wound is actually showing signs of good improvement this is definitely measuring smaller. I do not see any signs of infection which is great news and overall very pleased in that regard. Overall I think that she is doing well with the [facility]. 07/30/2020 upon evaluation today patient appears to be doing well in regard to her wound. She has been tolerating the dressing changes without complication think the Hydrofera Blue at this point  is actually causing her to dry out a lot as far as the wound bed is concerned. Subsequently I think that we need to try to see what we can do to improve this. Fortunately there does not appear to be any evidence of active infection at this time which is great news. 08/06/2021 upon evaluation today patient appears to be doing well with regards to her wound. Is not measuring significantly smaller initial inspection but upon closer inspection she has a lot of new skin growth across the central portion of the wound. I think will get very close to complete resolution. 08/13/2021 upon evaluation today patient appears to be doing well with regard to her wound. In fact this appears to be I believe healed although there is still some question as to whether it is completely so. I am concerned about the fact that to be honest she still has some evidence of dry skin that could be just trapping something underneath I still want to debride anything as I am afraid of causing some damage to the good skin for that reason I Georgina Peer probably monitor for 1 more week before completely closing everything out. 08/20/2021 upon evaluation today patient actually appears to be doing excellent. In fact she appears to be  completely healed. This was the case last week as well we just wanted to make sure nothing reopened since she does not really have an ability to put on compression socks this is good to be something we need to make sure was well-healed. Nonetheless I think we have achieved that goal as of today. 12/27; this is a patient we discharged 2 weeks ago with wounds on her left lower leg laterally. She was supposed to get stockings and really did not get them. She developed blistering and reopening probably sometime late last week. She has 3 areas in the same area as previously. The mid calf dimensions on the left went from 39 to 51 cm today. She also has very significant edema on the right leg but as far as I am aware has not had wounds in this area Electronic Signature(s) Signed: 09/03/2021 4:21:37 PM By: Linton Ham MD Entered By: Linton Ham on 09/03/2021 15:20:03 [name] ([id number]) -------------------------------------------------------------------------------- Physical Exam Details Patient Name: [name] Date of Service: 09/03/2021 2:00 PM Medical Record Number: [id number] Patient Account Number: [id number] Date of Birth/Sex: 11/03/1945 (75 y.o. F) Treating RN: Carlene Coria Primary Care Provider: [name] Other Clinician: Referring Provider: [name] Treating Provider/Extender: Tito Dine in Treatment: 32 Constitutional Sitting or standing Blood Pressure is within target range for patient.. Pulse regular and within target range for patient.Marland Kitchen Respirations regular, non- labored and within target range.. Temperature is normal and within the target range for the patient.Marland Kitchen appears in no distress. Respiratory Respiratory effort is easy and symmetric bilaterally. Rate is normal at rest and on room air.. Cardiovascular Wound exam; the patient has 3 separate open areas all of them looking like denuded blisters. There is no evidence of infection. Slough  on the surface of these washed off with saline and gauze. Uncontrolled edema. No evidence of a DVT soft midsystolic murmur JVP not elevated no evidence of CHF. Edema present in both extremities.. Notes Wound exam; 3 superficial open areas the largest is anterior in the smaller ones are posterior. Completely uncontrolled nonpitting edema. No evidence of cellulitis and I do not believe there is any evidence of a DVT this is largely nonpitting no warmth no tenderness Electronic Signature(s) Signed: 09/03/2021  4:21:37 PM By: Linton Ham MD Entered By: Linton Ham on 09/03/2021 15:22:41 [name] ([id number]) -------------------------------------------------------------------------------- Physician Orders Details Patient Name: [name] Date of Service: 09/03/2021 2:00 PM Medical Record Number: [id number] Patient Account Number: [id number] Date of Birth/Sex: 22-Feb-1946 (75 y.o. F) Treating RN: Carlene Coria Primary Care Provider: [name] Other Clinician: Referring Provider: [name] Treating Provider/Extender: Tito Dine in Treatment: 50 Verbal / Phone Orders: No Diagnosis Coding Follow-up Appointments o Return Appointment in 1 week. Bathing/ Shower/ Hygiene o May shower with wound dressing protected with water repellent cover or cast protector. Edema Control - Lymphedema / Segmental Compressive Device / Other Left Lower Extremity o Optional: One layer of unna paste to top of compression wrap (to act as an anchor). o Patient to wear own compression stockings. Remove compression stockings every night before going to bed and put on every morning when getting up. - on RIGHT LEG call [employer] for compression sock 15-22 mm/Hg, measurements and phone number on blue sheet [employer] on [address] in [place] also has the compression socks o Elevate, Exercise Daily and Avoid Standing for Long Periods of Time. o Elevate legs  to the level of the heart and pump ankles as often as possible o Elevate leg(s) parallel to the floor when sitting. o Compression Pump: Use compression pump on left lower extremity for 60 minutes, twice daily. - You may pump over wraps o Compression Pump: Use compression pump on right lower extremity for 60 minutes, twice daily. - You may pump over wraps Wound Treatment Wound #3 - Lower Leg Wound Laterality: Left Primary Dressing: Hydrofera Blue Ready Transfer Foam, 4x5 (in/in) 1 x Per Week/30 Days Discharge Instructions: Apply Hydrofera Blue Ready to wound bed as directed Secondary Dressing: ABD Pad 5x9 (in/in) 1 x Per Week/30 Days Discharge Instructions: Cover with ABD pad Compression Wrap: Profore Lite LF 3 Multilayer Compression Bandaging System 1 x Per Week/30 Days Discharge Instructions: Apply 3 multi-layer wrap as prescribed. Electronic Signature(s) Signed: 09/03/2021 4:21:37 PM By: Linton Ham MD Signed: 09/06/2021 4:17:41 PM By: Carlene Coria RN Entered By: Carlene Coria on 09/03/2021 14:51:06 [name] ([id number]) -------------------------------------------------------------------------------- Problem List Details Patient Name: [name] Date of Service: 09/03/2021 2:00 PM Medical Record Number: [id number] Patient Account Number: [id number] Date of Birth/Sex: 1946-08-31 (75 y.o. F) Treating RN: Carlene Coria Primary Care Provider: [name] Other Clinician: Referring Provider: [name] Treating Provider/Extender: Tito Dine in Treatment: 32 Active Problems ICD-10 Encounter Code Description Active Date MDM Diagnosis I89.0 Lymphedema, not elsewhere classified 01/16/2021 No Yes I87.332 Chronic venous hypertension (idiopathic) with ulcer and inflammation of 01/16/2021 No Yes left lower extremity L97.828 Non-pressure chronic ulcer of other part of left lower leg with other 01/16/2021 No Yes specified severity Inactive Problems Resolved  Problems Electronic Signature(s) Signed: 09/03/2021 4:21:37 PM By: Linton Ham MD Entered By: Linton Ham on 09/03/2021 15:18:53 [name] ([id number]) -------------------------------------------------------------------------------- Progress Note Details Patient Name: [name] Date of Service: 09/03/2021 2:00 PM Medical Record Number: [id number] Patient Account Number: [id number] Date of Birth/Sex: 1945-09-30 (75 y.o. F) Treating RN: Carlene Coria Primary Care Provider: [name] Other Clinician: Referring Provider: [name] Treating Provider/Extender: Tito Dine in Treatment: 32 Subjective History of Present Illness (HPI) 03/15/2020 upon evaluation today patient presents today for initial evaluation here in our clinic concerning a wound that is on the left posterior lower extremity. Unfortunately this has been giving the patient some discomfort at this point which she notes has been  affected her pretty much daily. The patient does have a history of venous insufficiency, hypertension, congestive heart failure, and a history of breast cancer. Fortunately there does not appear to be evidence of active infection at this time which is great news. No fevers, chills, nausea, vomiting, or diarrhea. Wound is not extremely large but does have some slough covering the surface of the wound. This is going require sharp debridement today. 03/15/2020 upon evaluation today patient appears to be doing fairly well in regard to her wound. She does have some slough noted on the surface of the wound currently but I do believe the Iodoflex has been beneficial over the past week. There is no signs of active infection at this time which is great news and overall very pleased with the progress. No fevers, chills, nausea, vomiting, or diarrhea. 04/02/2020 upon evaluation today patient appears to be doing a little better in regard to her wound size wise this is not tremendously  smaller she does have some slough buildup on the surface of the wound. With that being said this is going require some sharp debridement today to clear away some of the necrotic debris. Fortunately there is no evidence of active infection at this time. No fevers, chills, nausea, vomiting, or diarrhea. 04/10/2020 on evaluation today patient appears to be doing well with regard to her ulcer which is measuring somewhat smaller today. She actually has more granulation tissue noted which is also good news is no need for sharp debridement today. Fortunately there is no evidence of infection either which is also excellent. No fevers, chills, nausea, vomiting, or diarrhea. 04/26/2020 on evaluation today patient's wound actually is showing signs of good improvement which is great news. There does not appear to be any evidence of active infection. I do believe she is tolerating the collagen at this point. 05/03/2020 upon evaluation today patient's wound actually showed signs of good granulation at this time there does not appear to be any evidence of active infection which is great news and overall very pleased with where things stand. With that being said I do believe that the patient is can require some sharp debridement today but fortunately nothing too significant. 05/11/20 on evaluation today patient appears to be doing well in regard to her leg ulcer. She's been tolerating the dressing changes without complication. Fortunately there is no signs of active infection at this time. Overall I'm very pleased with where things stand. 05/18/2020 upon evaluation today patient's wound is showing signs of improvement is very dry and I think the collagen for the most part just dried to the wound bed. I am going to clean this away with sharp debridement in order to get this under control in my opinion. 05/25/2020 upon evaluation today patient actually appears to be doing well in regard to her leg ulcer. In fact upon  inspection it appears she could potentially be completely healed but that is not guaranteed based on what we are seeing. There does not appear to be any signs of active infection which is great news. 05/31/2020 on evaluation today patient appears to be doing well with regard to her wounds. She in fact appears to be almost completely healed on the left leg there is just a very small opening remaining point 1 06/08/2020 upon evaluation today patient appears to be doing excellent in regard to her leg ulcer on the left in fact it appears to be that she is completely healed as of today. I see no signs of active  infection at this time which is great news. No fevers, chills, nausea, vomiting, or diarrhea. READMISSION 01/16/2021 Patient that we have had in this clinic with a wound on the left posterior calf in the summer 2021 extending into October. She was felt to have chronic venous insufficiency. Per the patient's description she was discharged with what sounds like external compression stockings although she could not afford the "$75 per leg". She therefore did not get anything. Apparently her wound that she has currently is been in existence since January. She has been followed at vein and vascular with Unna boots and I think calcium alginate. She had ABIs done on 06/25/2020 that showed an noncompressible ABI on the right leg and the left leg but triphasic waveforms with good great toe pressures and a TBI of 0.90 on the right and 0.91 on the left Surprisingly the patient also had venous reflux studies that were really unremarkable. This included no evidence of DVTs or SVTs but there was no evidence of deep venous insufficiency or superficial venous insufficiency in the greater saphenous or short saphenous veins bilaterally. I would wonder about more central venous issues or perhaps this is all lymphedema 01/25/2021 upon evaluation today patient appears to be doing decently well and guard to her wound. The  good news is the Iodoflex that is job she saw Dr. Dellia Nims last week for readmission and to be honest I think that this has done extremely well over that week. I do believe that she can tolerate the sharp debridement today which will be good. I think that cleaning off the wound will likely allow [place] to be able to use a different type of dressing I do not think the Iodoflex will even be necessary based on how clean the wound looks. Fortunately there is no sign of active infection at this time which is great news. No fevers, chills, nausea, vomiting, or diarrhea. 02/12/2021 upon evaluation today patient appears to be doing well with regard to her leg ulcer. We did switch to alginate when she came for nurse visit I think is doing much better for her. Fortunately there does not appear to be any signs of active infection at this time which is great news. No fevers, chills, nausea, vomiting, or diarrhea. CYNDEE, GIAMMARCO ([id number]) 02/18/2021 upon evaluation today patient's wound is actually showing signs of good improvement. I am very pleased with where things stand currently. I do not see any signs of active infection which is great news and overall I feel like she is making good progress. The patient likewise is happy that things are doing so well and that this is measuring somewhat smaller. 02/25/2021 upon evaluation today patient actually appears to be doing quite well in regard to her wounds. She has been tolerating the dressing changes without complication. Fortunately there does not appear to be any signs of active infection which is great news and overall I am extremely pleased with where things stand. No fevers, chills, nausea, vomiting, or diarrhea. She does have a lot of edema I really do think she needs compression socks to be worn in order to prevent things from causing additional issues for her currently. Especially on the right leg she should be wearing compression socks all the time to be  honest. 03/04/2021 upon evaluation today patient's leg ulcer actually appears to be doing pretty well which is great news. There does not appear to be any significant change but overall the appearance is better even though the size is not necessarily  reflecting a great improvement. Fortunately there does not appear to be any signs of active infection. No fevers, chills, nausea, vomiting, or diarrhea. 03/12/2021 upon evaluation today patient appears to be doing well with regard to her wounds. She has been tolerating the dressing changes without complication. The good news is that the wound on her left lateral leg is doing much better and is showing signs of good improvement. Overall her swelling is controlled well as well. She does need some compression socks she plans to go Jefferey socks next week. 03/18/2021 on evaluation today patient's wound actually is showing signs of good improvement. She does have a little bit of slough this can require some sharp debridement today. 03/25/2021 upon evaluation today patient appears to be doing well with regard to her wound on the left lateral leg. She has been tolerating the dressing changes without complication. Fortunately there does not appear to be any signs of active infection at this time. No fever chills no 04/01/2021 upon evaluation today patient's wound is showing signs of improvement and overall very pleased with where things stand at this point. There is no evidence of active infection which is great news as well and in general I think that she is making great progress. 04/09/2021 upon evaluation today patient appears to be doing well with regard to her ankle ulcer. She is making good progress currently which is great news. There does not appear to be any signs of infection also excellent news. In general I am extremely pleased with where things stand today. No fevers, chills, nausea, vomiting, or diarrhea. 04/23/2021 upon inspection today patient appears to  be doing quite well with regard to her leg ulcer. She has been tolerating the dressing changes without complication. She is can require some sharp debridement today but overall seems to be doing excellent. 05/06/2021 upon evaluation today patient unfortunately appears to be doing poorly overall in regard to her swelling. She is actually significantly more swollen than last week despite the fact that her wound is doing better this is not a good trend. I think that she probably needs to see her cardiologist ASAP and I discussed that with her today. This includes the fact that honestly I think she probably is getting need an increase in her diuretic or something to try to clear away some of the excess fluid that she is experiencing at the moment. She is not been doing her pumps even twice a day but again right now more concerned that if she were to do the pump she would fluid overload her lungs causing other issues as far as her breathing is concerned. Obviously I think she needs to contact them and move up the appointment for the 12th of something sooner 2 weeks is a bit too long to wait with how swollen she has. 05/14/2021 upon evaluation today patient's wound actually showing signs of being a little bit smaller compared to previous. She has been making good progress however. Fortunately there does not appear to be any evidence of active infection which is great. Overall I am extremely pleased in that regard. Nonetheless I am still concerned that she is having quite a bit of issue as far as the swelling is concerned she has been placed on fluid restriction as well as an increase in the furosemide by her doctor. We will see how things progress. 05/21/2021 upon evaluation today patient appears to be doing well with regard to her wound in general. She has been tolerating the dressing changes  without complication. With that being said she has been using silver cell for a while and while this was showing signs  of improvement it is now gotten to the point where this has slowed down quite significantly. I do believe that switching to a different dressing may be beneficial for her today. 05/28/2021 upon evaluation today patient actually appears to be doing quite well in regard to her wound. In fact there is really not any need for sharp debridement today based on what I see. The Hydrofera Blue is doing great and overall I think that she is managing quite nicely with the compression wrapping. Her edema is still down quite a bit with the wrapping in place. 06/11/2021 upon evaluation today patient appears to be doing well with regard to her leg ulcer. She has been tolerating the dressing changes without complication. Fortunately there does not appear to be any signs of active infection at this time which is great news. No fevers, chills, nausea, vomiting, or diarrhea. She is going require some sharp debridement today. 10/11; left leg ulcer much better looking and with improved surface area. She is using Hydrofera Blue under 3 layer compression. She does not have any stocking on the right leg which in itself has very significant nonpitting edema 06/25/2021 upon evaluation today patient appears to be doing well with regard to the wound on her left leg. She has been tolerating the dressing changes without complication and overall I am extremely pleased with where things stand today. I do not see any evidence of infection which is great news. 07/02/2021 upon evaluation today patient's wound actually showing signs of excellent improvement and actually very pleased with where we stand today and the overall appearance. Fortunately there does not appear to be any signs of active infection. No fevers, chills, nausea, vomiting, or diarrhea. 07/09/2021 upon evaluation today patient appears to be doing better in regard to her wound this is measuring smaller and she is headed in the appropriate direction based on what I am  seeing currently. I do not see any evidence of active infection systemically which is great news. 07/16/2021 upon evaluation today patient appears to be doing pretty well currently in regard to her leg ulcer. This is measuring smaller and looking much better this is great news. Fortunately there does not appear to be any evidence of active infection systemically which is great news as well. 07/23/2021 upon evaluation today patient's wound is actually showing signs of good improvement this is definitely measuring smaller. I do not see any signs of infection which is great news and overall very pleased in that regard. Overall I think that she is doing well with the [facility]. SACORA, HAWBAKER ([id number]) 07/30/2020 upon evaluation today patient appears to be doing well in regard to her wound. She has been tolerating the dressing changes without complication think the Hydrofera Blue at this point is actually causing her to dry out a lot as far as the wound bed is concerned. Subsequently I think that we need to try to see what we can do to improve this. Fortunately there does not appear to be any evidence of active infection at this time which is great news. 08/06/2021 upon evaluation today patient appears to be doing well with regards to her wound. Is not measuring significantly smaller initial inspection but upon closer inspection she has a lot of new skin growth across the central portion of the wound. I think will get very close to complete resolution. 08/13/2021 upon evaluation  today patient appears to be doing well with regard to her wound. In fact this appears to be I believe healed although there is still some question as to whether it is completely so. I am concerned about the fact that to be honest she still has some evidence of dry skin that could be just trapping something underneath I still want to debride anything as I am afraid of causing some damage to the good skin for that reason  I Georgina Peer probably monitor for 1 more week before completely closing everything out. 08/20/2021 upon evaluation today patient actually appears to be doing excellent. In fact she appears to be completely healed. This was the case last week as well we just wanted to make sure nothing reopened since she does not really have an ability to put on compression socks this is good to be something we need to make sure was well-healed. Nonetheless I think we have achieved that goal as of today. 12/27; this is a patient we discharged 2 weeks ago with wounds on her left lower leg laterally. She was supposed to get stockings and really did not get them. She developed blistering and reopening probably sometime late last week. She has 3 areas in the same area as previously. The mid calf dimensions on the left went from 39 to 51 cm today. She also has very significant edema on the right leg but as far as I am aware has not had wounds in this area Objective Constitutional Sitting or standing Blood Pressure is within target range for patient.. Pulse regular and within target range for patient.Marland Kitchen Respirations regular, non- labored and within target range.. Temperature is normal and within the target range for the patient.Marland Kitchen appears in no distress. Vitals Time Taken: 2:25 PM, Height: 69 in, Weight: 244 lbs, BMI: 36, Temperature: 98.4 F, Pulse: 78 bpm, Respiratory Rate: 18 breaths/min, Blood Pressure: 133/86 mmHg. Respiratory Respiratory effort is easy and symmetric bilaterally. Rate is normal at rest and on room air.. Cardiovascular Wound exam; the patient has 3 separate open areas all of them looking like denuded blisters. There is no evidence of infection. Slough on the surface of these washed off with saline and gauze. Uncontrolled edema. No evidence of a DVT soft midsystolic murmur JVP not elevated no evidence of CHF. Edema present in both extremities.. General Notes: Wound exam; 3 superficial open areas the  largest is anterior in the smaller ones are posterior. Completely uncontrolled nonpitting edema. No evidence of cellulitis and I do not believe there is any evidence of a DVT this is largely nonpitting no warmth no tenderness Integumentary (Hair, Skin) Wound #2 status is Healed - Epithelialized. Original cause of wound was Gradually Appeared. The date acquired was: 09/08/2020. The wound has been in treatment 32 weeks. The wound is located on the Left,Lateral Lower Leg. The wound measures 0cm length x 0cm width x 0cm depth; 0cm^2 area and 0cm^3 volume. There is a none present amount of drainage noted. Wound #3 status is Open. Original cause of wound was Gradually Appeared. The date acquired was: 08/15/2021. The wound is located on the Left Lower Leg. The wound measures 9cm length x 8.5cm width x 0.1cm depth; 60.083cm^2 area and 6.008cm^3 volume. There is Fat Layer (Subcutaneous Tissue) exposed. There is no tunneling or undermining noted. There is a medium amount of serosanguineous drainage noted. There is medium (34-66%) granulation within the wound bed. There is a medium (34-66%) amount of necrotic tissue within the wound bed. 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 There was a Three Layer Compression Therapy Procedure by Carlene Coria, RN. Post procedure Diagnosis Wound #3: Same as Pre-Procedure Plan Follow-up Appointments: Return Appointment in 1 week. Bathing/ Shower/ Hygiene: May shower with wound dressing protected with water repellent cover or cast protector. Edema Control - Lymphedema / Segmental Compressive Device / Other: Optional: One layer  of unna paste to top of compression wrap (to act as an anchor). Patient to wear own compression stockings. Remove compression stockings every night before going to bed and put on every morning when getting up. - on RIGHT LEG call [facility] for compression sock 15-22 mm/Hg, measurements and phone number on blue sheet [employer] on [employer] in [place] also has the compression socks Elevate, Exercise Daily and Avoid Standing for Long Periods of Time. Elevate legs to the level of the heart and pump ankles as often as possible Elevate leg(s) parallel to the floor when sitting. Compression Pump: Use compression pump on left lower extremity for 60 minutes, twice daily. - You may pump over wraps Compression Pump: Use compression pump on right lower extremity for 60 minutes, twice daily. - You may pump over wraps WOUND #3: - Lower Leg Wound Laterality: Left Primary Dressing: Hydrofera Blue Ready Transfer Foam, 4x5 (in/in) 1 x Per Week/30 Days Discharge Instructions: Apply Hydrofera Blue Ready to wound bed as directed Secondary Dressing: ABD Pad 5x9 (in/in) 1 x Per Week/30 Days Discharge Instructions: Cover with ABD pad Compression Wrap: Profore Lite LF 3 Multilayer Compression Bandaging System 1 x Per Week/30 Days Discharge Instructions: Apply 3 multi-layer wrap as prescribed. 1. I think this is all uncontrolled edema from noncompliance with stockings 2. I am doubtful there is anything acute here. 3. We gave her the number for elastic therapy in [place] but truthfully I am not certain 85/63 stockings will suffice here 4. Although she does not have wounds on the right leg this may need to be wrapped in order to accommodate a stocking. I did not start this today however Electronic Signature(s) Signed: 09/03/2021 4:21:37 PM By: Linton Ham MD Entered By: Linton Ham on 09/03/2021 15:24:09 [name]  ([id number]) -------------------------------------------------------------------------------- [place] Details Patient Name: [name] Date of Service: 09/03/2021 Medical Record Number: [id number] Patient Account Number: [id number] Date of Birth/Sex: Oct 21, 1945 (75 y.o. F) Treating RN: Carlene Coria Primary Care Provider: [name] Other Clinician: Referring Provider: [name] Treating Provider/Extender: Tito Dine in Treatment: 32 Diagnosis Coding ICD-10 Codes Code Description I89.0 Lymphedema, not elsewhere classified I87.332 Chronic venous hypertension (idiopathic) with ulcer and inflammation of left lower extremity L97.828 Non-pressure chronic ulcer of other part of left lower leg with other specified severity Facility Procedures CPT4 Code: [id number] Description: (Facility Use Only) [id number] - [place] LWR LT LEG Modifier: Quantity: 1 Physician Procedures CPT4 Code Description: [id number] - WC PHYS LEVEL 4 - EST PT Modifier: Quantity: 1 CPT4 Code Description: ICD-10 Diagnosis Description I87.332 Chronic venous hypertension (idiopathic) with ulcer and inflammation of le I89.0 Lymphedema, not elsewhere classified L97.828 Non-pressure chronic ulcer of other part of left lower leg with  other spec Modifier: ft lower extremity ified severity Quantity: Electronic Signature(s) Signed: 09/03/2021 4:21:37 PM By: Linton Ham MD Entered  By: Linton Ham on 09/03/2021 15:24:34

## 2021-09-06 NOTE — Telephone Encounter (Signed)
Pt called to reschedule her appt for 12-29. Call back at [phone]

## 2021-09-11 ENCOUNTER — Encounter: Payer: Medicare Other | Attending: Internal Medicine | Admitting: Internal Medicine

## 2021-09-11 ENCOUNTER — Other Ambulatory Visit: Payer: Self-pay

## 2021-09-11 DIAGNOSIS — I87332 Chronic venous hypertension (idiopathic) with ulcer and inflammation of left lower extremity: Secondary | ICD-10-CM | POA: Insufficient documentation

## 2021-09-11 DIAGNOSIS — L97828 Non-pressure chronic ulcer of other part of left lower leg with other specified severity: Secondary | ICD-10-CM | POA: Diagnosis not present

## 2021-09-11 DIAGNOSIS — I890 Lymphedema, not elsewhere classified: Secondary | ICD-10-CM | POA: Diagnosis present

## 2021-09-11 NOTE — Progress Notes (Signed)
TULA, SCHRYVER ([id number]) Visit Report for 09/11/2021 Arrival Information Details Patient Name: [name], [name] Date of Service: 09/11/2021 2:15 PM Medical Record Number: [id number] Patient Account Number: [id number] Date of Birth/Sex: Nov 24, 1945 (76 y.o. F) Treating RN: Levora Dredge Primary Care Aimar Borghi: [name] Other Clinician: Referring Vitoria Conyer: [name] Treating Bridey Brookover/Extender: Yaakov Guthrie in Treatment: 32 Visit Information History Since Last Visit Added or deleted any medications: No Patient Arrived: Wheel Chair Any new allergies or adverse reactions: No Arrival Time: 14:18 Had a fall or experienced change in No Accompanied By: self activities of daily living that may affect Transfer Assistance: EasyPivot Patient risk of falls: Lift Hospitalized since last visit: No Patient Identification Verified: Yes Has Dressing in Place as Prescribed: Yes Secondary Verification Process Completed: Yes Has Compression in Place as Prescribed: Yes Patient Requires Transmission-Based No Pain Present Now: No Precautions: Patient Has Alerts: Yes Patient Alerts: NOT DIABETIC Electronic Signature(s) Signed: 09/11/2021 4:31:39 PM By: Levora Dredge Entered By: Levora Dredge on 09/11/2021 14:25:57 [name] ([id number]) -------------------------------------------------------------------------------- Encounter Discharge Information Details Patient Name: [name] Date of Service: 09/11/2021 2:15 PM Medical Record Number: [id number] Patient Account Number: [id number] Date of Birth/Sex: [birth year] (75 y.o. F) Treating RN: Levora Dredge Primary Care Cerina Leary: [name] Other Clinician: Referring Jazzmine Kleiman: [name] Treating Loui Massenburg/Extender: Yaakov Guthrie in Treatment: 8 Encounter Discharge Information Items Post Procedure Vitals Discharge Condition: Stable Temperature (F): 97.7 Ambulatory Status: Wheelchair Pulse (bpm):  54 Discharge Destination: Home Respiratory Rate (breaths/min): 18 Transportation: Other Blood Pressure (mmHg): 156/78 Accompanied By: self Schedule Follow-up Appointment: Yes Clinical Summary of Care: Electronic Signature(s) Signed: 09/11/2021 3:50:55 PM By: Levora Dredge Entered By: Levora Dredge on 09/11/2021 15:50:54 [name] ([id number]) -------------------------------------------------------------------------------- Lower Extremity Assessment Details Patient Name: [name] Date of Service: 09/11/2021 2:15 PM Medical Record Number: [id number] Patient Account Number: [id number] Date of Birth/Sex: May 10, 1946 (76 y.o. F) Treating RN: Levora Dredge Primary Care Meryl Hubers: [name] Other Clinician: Referring Maryjean Corpening: [name] Treating Heman Que/Extender: Yaakov Guthrie in Treatment: 34 Edema Assessment Assessed: [Left: No] [Right: No] [Left: Edema] [Right: :] Calf Left: Right: Point of Measurement: 27 cm From Medial Instep 43 cm Ankle Left: Right: Point of Measurement: 10 cm From Medial Instep 26 cm Vascular Assessment Pulses: Dorsalis Pedis Palpable: [Left:Yes] Posterior Tibial Palpable: [Left:Yes] Notes left foot pulses palpable but weak Electronic Signature(s) Signed: 09/11/2021 4:31:39 PM By: Levora Dredge Entered By: Levora Dredge on 09/11/2021 14:42:12 [name] ([id number]) -------------------------------------------------------------------------------- Multi Wound Chart Details Patient Name: [name] Date of Service: 09/11/2021 2:15 PM Medical Record Number: [id number] Patient Account Number: [id number] Date of Birth/Sex: [birth year] (75 y.o. F) Treating RN: Levora Dredge Primary Care Kaiyon Hynes: [name] Other Clinician: Referring Moishe Schellenberg: [name] Treating Krystin Keeven/Extender: Yaakov Guthrie in Treatment: 34 Vital Signs Height(in): 69 Pulse(bpm): 82 Weight(lbs): 244 Blood Pressure(mmHg):  156/78 Body Mass Index(BMI): 36 Temperature(F): 97.7 Respiratory Rate(breaths/min): 18 Photos: [N/A:N/A] Wound Location: Left Lower Leg N/A N/A Wounding Event: Gradually Appeared N/A N/A Primary Etiology: Lymphedema N/A N/A Comorbid History: Cataracts, Lymphedema, Sleep N/A N/A Apnea, Congestive Heart Failure, Hypertension, Osteoarthritis, Neuropathy, Received Chemotherapy, Received Radiation Date Acquired: 08/15/2021 N/A N/A Weeks of Treatment: 1 N/A N/A Wound Status: Open N/A N/A Measurements L x W x D (cm) 6.8x8.1x0.1 N/A N/A Area (cm) : 43.26 N/A N/A Volume (cm) : 4.326 N/A N/A % Reduction in Area: 28.00% N/A N/A % Reduction in Volume: 28.00% N/A N/A Classification: Full Thickness Without Exposed N/A N/A Support Structures Exudate Amount: Medium N/A N/A Exudate Type: Serosanguineous N/A N/A  Exudate Color: red, brown N/A N/A Granulation Amount: Medium (34-66%) N/A N/A Granulation Quality: Red, Pink, Hyper-granulation N/A N/A Necrotic Amount: Medium (34-66%) N/A N/A Exposed Structures: Fat Layer (Subcutaneous Tissue): N/A N/A Yes Fascia: No Tendon: No Muscle: No Joint: No Bone: No Epithelialization: None N/A N/A Assessment Notes: surrounding skin appears N/A N/A macerated. Treatment Notes Electronic Signature(s) Signed: 09/11/2021 4:31:39 PM By: Levora Dredge Entered By: Levora Dredge on 09/11/2021 15:18:00 [name], [name] ([id number]) [name], [name] ([id number]) -------------------------------------------------------------------------------- Multi-Disciplinary Care Plan Details Patient Name: [name] Date of Service: 09/11/2021 2:15 PM Medical Record Number: [id number] Patient Account Number: [id number] Date of Birth/Sex: 1945-10-08 (76 y.o. F) Treating RN: Levora Dredge Primary Care Ryah Cribb: [name] Other Clinician: Referring Zyria Fiscus: [name] Treating Muslima Toppins/Extender: Yaakov Guthrie in Treatment: 31 Active  Inactive Electronic Signature(s) Signed: 09/11/2021 4:31:39 PM By: Levora Dredge Entered By: Levora Dredge on 09/11/2021 15:17:43 [name] ([id number]) -------------------------------------------------------------------------------- Pain Assessment Details Patient Name: [name] Date of Service: 09/11/2021 2:15 PM Medical Record Number: [id number] Patient Account Number: [id number] Date of Birth/Sex: Jun 03, 1946 (75 y.o. F) Treating RN: Levora Dredge Primary Care Zalen Sequeira: [name] Other Clinician: Referring Felesha Moncrieffe: [name] Treating Daryll Spisak/Extender: Yaakov Guthrie in Treatment: 34 Active Problems Location of Pain Severity and Description of Pain Patient Has Paino No Site Locations Rate the pain. Current Pain Level: 0 Pain Management and Medication Current Pain Management: Electronic Signature(s) Signed: 09/11/2021 4:31:39 PM By: Levora Dredge Entered By: Levora Dredge on 09/11/2021 14:26:30 [name] ([id number]) -------------------------------------------------------------------------------- Patient/Caregiver Education Details Patient Name: [name] Date of Service: 09/11/2021 2:15 PM Medical Record Number: [id number] Patient Account Number: [id number] Date of Birth/Gender: 1946/04/07 (76 y.o. F) Treating RN: Levora Dredge Primary Care Physician: [name] Other Clinician: Referring Physician: [name] Treating Physician/Extender: Yaakov Guthrie in Treatment: 48 Education Assessment Education Provided To: Patient Education Topics Provided Wound/Skin Impairment: Handouts: Caring for Your Ulcer Methods: Explain/Verbal Responses: State content correctly Electronic Signature(s) Signed: 09/11/2021 4:31:39 PM By: Levora Dredge Entered By: Levora Dredge on 09/11/2021 15:49:41 [name] ([id number]) -------------------------------------------------------------------------------- Wound Assessment  Details Patient Name: [name] Date of Service: 09/11/2021 2:15 PM Medical Record Number: [id number] Patient Account Number: [id number] Date of Birth/Sex: [birth year] (75 y.o. F) Treating RN: Levora Dredge Primary Care Khaylee Mcevoy: [name] Other Clinician: Referring Kelby Lotspeich: [name] Treating Orlan Aversa/Extender: Yaakov Guthrie in Treatment: 34 Wound Status Wound Number: 3 Primary Lymphedema Etiology: Wound Location: Left Lower Leg Wound Open Wounding Event: Gradually Appeared Status: Date Acquired: 08/15/2021 Comorbid Cataracts, Lymphedema, Sleep Apnea, Congestive Heart Weeks Of Treatment: 1 History: Failure, Hypertension, Osteoarthritis, Neuropathy, Clustered Wound: No Received Chemotherapy, Received Radiation Photos Wound Measurements Length: (cm) 6.8 Width: (cm) 8.1 Depth: (cm) 0.1 Area: (cm) 43.26 Volume: (cm) 4.326 % Reduction in Area: 28% % Reduction in Volume: 28% Epithelialization: None Tunneling: No Undermining: No Wound Description Classification: Full Thickness Without Exposed Support Structu Exudate Amount: Medium Exudate Type: Serosanguineous Exudate Color: red, brown res Foul Odor After Cleansing: No Slough/Fibrino Yes Wound Bed Granulation Amount: Medium (34-66%) Exposed Structure Granulation Quality: Red, Pink, Hyper-granulation Fascia Exposed: No Necrotic Amount: Medium (34-66%) Fat Layer (Subcutaneous Tissue) Exposed: Yes Necrotic Quality: Adherent Slough Tendon Exposed: No Muscle Exposed: No Joint Exposed: No Bone Exposed: No Assessment Notes surrounding skin appears macerated. Treatment Notes Wound #3 (Lower Leg) Wound Laterality: Left Cleanser Peri-Wound Care [name], [name] ([id number]) Topical Primary Dressing Hydrofera Blue Ready Transfer Foam, 4x5 (in/in) Discharge Instruction: Apply Hydrofera Blue Ready to wound bed as directed Secondary Dressing ABD Pad 5x9 (in/in) Discharge  Instruction: Cover with ABD  pad Secured With Compression Wrap Profore Lite LF 3 Multilayer Compression Bandaging System Discharge Instruction: Apply 3 multi-layer wrap as prescribed. Compression Stockings Add-Ons Electronic Signature(s) Signed: 09/11/2021 4:31:39 PM By: Levora Dredge Entered By: Levora Dredge on 09/11/2021 14:39:17 [name] ([id number]) -------------------------------------------------------------------------------- [place] Details Patient Name: [name] Date of Service: 09/11/2021 2:15 PM Medical Record Number: [id number] Patient Account Number: [id number] Date of Birth/Sex: Feb 17, 1946 (76 y.o. F) Treating RN: Levora Dredge Primary Care Willian Donson: [name] Other Clinician: Referring Tziporah Knoke: [name] Treating Cannie Muckle/Extender: Yaakov Guthrie in Treatment: 34 Vital Signs Time Taken: 14:25 Temperature (F): 97.7 Height (in): 69 Pulse (bpm): 54 Weight (lbs): 244 Respiratory Rate (breaths/min): 18 Body Mass Index (BMI): 36 Blood Pressure (mmHg): 156/78 Reference Range: 80 - 120 mg / dl Electronic Signature(s) Signed: 09/11/2021 4:31:39 PM By: Levora Dredge Entered By: Levora Dredge on 09/11/2021 14:26:19

## 2021-09-11 NOTE — Progress Notes (Signed)
ASTRID, VIDES ([id number]) Visit Report for 09/11/2021 Chief Complaint Document Details Patient Name: [name], [name] Date of Service: 09/11/2021 2:15 PM Medical Record Number: [id number] Patient Account Number: [id number] Date of Birth/Sex: 17-Jan-1946 (76 y.o. F) Treating RN: Levora Dredge Primary Care Provider: [name] Other Clinician: Referring Provider: [name] Treating Provider/Extender: Yaakov Guthrie in Treatment: 60 Information Obtained from: Patient Chief Complaint Left LE Ulcer Electronic Signature(s) Signed: 09/11/2021 3:59:43 PM By: Kalman Shan DO Entered By: Kalman Shan on 09/11/2021 15:56:11 [name] ([id number]) -------------------------------------------------------------------------------- Debridement Details Patient Name: [name] Date of Service: 09/11/2021 2:15 PM Medical Record Number: [id number] Patient Account Number: [id number] Date of Birth/Sex: 1946/02/16 (75 y.o. F) Treating RN: Levora Dredge Primary Care Provider: [name] Other Clinician: Referring Provider: [name] Treating Provider/Extender: Yaakov Guthrie in Treatment: 34 Debridement Performed for Wound #3 Left Lower Leg Assessment: Performed By: Physician Kalman Shan, MD Debridement Type: Debridement Level of Consciousness (Pre- Awake and Alert procedure): Pre-procedure Verification/Time Out Yes - 15:18 Taken: Total Area Debrided (L x W): 6.8 (cm) x 8.1 (cm) = 55.08 (cm) Tissue and other material Viable, Non-Viable, Slough, Subcutaneous, Slough debrided: Level: Skin/Subcutaneous Tissue Debridement Description: Excisional Instrument: Curette Bleeding: Minimum Hemostasis Achieved: Pressure Response to Treatment: Procedure was tolerated well Level of Consciousness (Post- Awake and Alert procedure): Post Debridement Measurements of Total Wound Length: (cm) 6.8 Width: (cm) 8.1 Depth: (cm) 0.1 Volume: (cm)  4.326 Character of Wound/Ulcer Post Debridement: Stable Post Procedure Diagnosis Same as Pre-procedure Electronic Signature(s) Signed: 09/11/2021 3:59:43 PM By: Kalman Shan DO Signed: 09/11/2021 4:31:39 PM By: Levora Dredge Entered By: Levora Dredge on 09/11/2021 15:19:04 [name] ([id number]) -------------------------------------------------------------------------------- HPI Details Patient Name: [name] Date of Service: 09/11/2021 2:15 PM Medical Record Number: [id number] Patient Account Number: [id number] Date of Birth/Sex: 1946-08-03 (76 y.o. F) Treating RN: Levora Dredge Primary Care Provider: [name] Other Clinician: Referring Provider: [name] Treating Provider/Extender: Yaakov Guthrie in Treatment: 57 History of Present Illness HPI Description: 03/15/2020 upon evaluation today patient presents today for initial evaluation here in our clinic concerning a wound that is on the left posterior lower extremity. Unfortunately this has been giving the patient some discomfort at this point which she notes has been affected her pretty much daily. The patient does have a history of venous insufficiency, hypertension, congestive heart failure, and a history of breast cancer. Fortunately there does not appear to be evidence of active infection at this time which is great news. No fevers, chills, nausea, vomiting, or diarrhea. Wound is not extremely large but does have some slough covering the surface of the wound. This is going require sharp debridement today. 03/15/2020 upon evaluation today patient appears to be doing fairly well in regard to her wound. She does have some slough noted on the surface of the wound currently but I do believe the Iodoflex has been beneficial over the past week. There is no signs of active infection at this time which is great news and overall very pleased with the progress. No fevers, chills, nausea, vomiting, or  diarrhea. 04/02/2020 upon evaluation today patient appears to be doing a little better in regard to her wound size wise this is not tremendously smaller she does have some slough buildup on the surface of the wound. With that being said this is going require some sharp debridement today to clear away some of the necrotic debris. Fortunately there is no evidence of active infection at this time. No fevers, chills, nausea, vomiting, or diarrhea. 04/10/2020 on  evaluation today patient appears to be doing well with regard to her ulcer which is measuring somewhat smaller today. She actually has more granulation tissue noted which is also good news is no need for sharp debridement today. Fortunately there is no evidence of infection either which is also excellent. No fevers, chills, nausea, vomiting, or diarrhea. 04/26/2020 on evaluation today patient's wound actually is showing signs of good improvement which is great news. There does not appear to be any evidence of active infection. I do believe she is tolerating the collagen at this point. 05/03/2020 upon evaluation today patient's wound actually showed signs of good granulation at this time there does not appear to be any evidence of active infection which is great news and overall very pleased with where things stand. With that being said I do believe that the patient is can require some sharp debridement today but fortunately nothing too significant. 05/11/20 on evaluation today patient appears to be doing well in regard to her leg ulcer. She's been tolerating the dressing changes without complication. Fortunately there is no signs of active infection at this time. Overall I'm very pleased with where things stand. 05/18/2020 upon evaluation today patient's wound is showing signs of improvement is very dry and I think the collagen for the most part just dried to the wound bed. I am going to clean this away with sharp debridement in order to get this under  control in my opinion. 05/25/2020 upon evaluation today patient actually appears to be doing well in regard to her leg ulcer. In fact upon inspection it appears she could potentially be completely healed but that is not guaranteed based on what we are seeing. There does not appear to be any signs of active infection which is great news. 05/31/2020 on evaluation today patient appears to be doing well with regard to her wounds. She in fact appears to be almost completely healed on the left leg there is just a very small opening remaining point 1 06/08/2020 upon evaluation today patient appears to be doing excellent in regard to her leg ulcer on the left in fact it appears to be that she is completely healed as of today. I see no signs of active infection at this time which is great news. No fevers, chills, nausea, vomiting, or diarrhea. READMISSION 01/16/2021 Patient that we have had in this clinic with a wound on the left posterior calf in the summer 2021 extending into October. She was felt to have chronic venous insufficiency. Per the patient's description she was discharged with what sounds like external compression stockings although she could not afford the "$75 per leg". She therefore did not get anything. Apparently her wound that she has currently is been in existence since January. She has been followed at vein and vascular with Unna boots and I think calcium alginate. She had ABIs done on 06/25/2020 that showed an noncompressible ABI on the right leg and the left leg but triphasic waveforms with good great toe pressures and a TBI of 0.90 on the right and 0.91 on the left Surprisingly the patient also had venous reflux studies that were really unremarkable. This included no evidence of DVTs or SVTs but there was no evidence of deep venous insufficiency or superficial venous insufficiency in the greater saphenous or short saphenous veins bilaterally. I would wonder about more central venous  issues or perhaps this is all lymphedema 01/25/2021 upon evaluation today patient appears to be doing decently well and guard to her wound.  The good news is the Iodoflex that is job she saw Dr. Dellia Nims last week for readmission and to be honest I think that this has done extremely well over that week. I do believe that she can tolerate the sharp debridement today which will be good. I think that cleaning off the wound will likely allow [place] to be able to use a different type of dressing I do not think the Iodoflex will even be necessary based on how clean the wound looks. Fortunately there is no sign of active infection at this time which is great news. No fevers, chills, nausea, vomiting, or diarrhea. 02/12/2021 upon evaluation today patient appears to be doing well with regard to her leg ulcer. We did switch to alginate when she came for nurse visit I think is doing much better for her. Fortunately there does not appear to be any signs of active infection at this time which is great news. No fevers, chills, nausea, vomiting, or diarrhea. [name], [name] ([id number]) 02/18/2021 upon evaluation today patient's wound is actually showing signs of good improvement. I am very pleased with where things stand currently. I do not see any signs of active infection which is great news and overall I feel like she is making good progress. The patient likewise is happy that things are doing so well and that this is measuring somewhat smaller. 02/25/2021 upon evaluation today patient actually appears to be doing quite well in regard to her wounds. She has been tolerating the dressing changes without complication. Fortunately there does not appear to be any signs of active infection which is great news and overall I am extremely pleased with where things stand. No fevers, chills, nausea, vomiting, or diarrhea. She does have a lot of edema I really do think she needs compression socks to be worn in order to prevent things  from causing additional issues for her currently. Especially on the right leg she should be wearing compression socks all the time to be honest. 03/04/2021 upon evaluation today patient's leg ulcer actually appears to be doing pretty well which is great news. There does not appear to be any significant change but overall the appearance is better even though the size is not necessarily reflecting a great improvement. Fortunately there does not appear to be any signs of active infection. No fevers, chills, nausea, vomiting, or diarrhea. 03/12/2021 upon evaluation today patient appears to be doing well with regard to her wounds. She has been tolerating the dressing changes without complication. The good news is that the wound on her left lateral leg is doing much better and is showing signs of good improvement. Overall her swelling is controlled well as well. She does need some compression socks she plans to go Jefferey socks next week. 03/18/2021 on evaluation today patient's wound actually is showing signs of good improvement. She does have a little bit of slough this can require some sharp debridement today. 03/25/2021 upon evaluation today patient appears to be doing well with regard to her wound on the left lateral leg. She has been tolerating the dressing changes without complication. Fortunately there does not appear to be any signs of active infection at this time. No fever chills no 04/01/2021 upon evaluation today patient's wound is showing signs of improvement and overall very pleased with where things stand at this point. There is no evidence of active infection which is great news as well and in general I think that she is making great progress. 04/09/2021 upon evaluation today patient  appears to be doing well with regard to her ankle ulcer. She is making good progress currently which is great news. There does not appear to be any signs of infection also excellent news. In general I am extremely  pleased with where things stand today. No fevers, chills, nausea, vomiting, or diarrhea. 04/23/2021 upon inspection today patient appears to be doing quite well with regard to her leg ulcer. She has been tolerating the dressing changes without complication. She is can require some sharp debridement today but overall seems to be doing excellent. 05/06/2021 upon evaluation today patient unfortunately appears to be doing poorly overall in regard to her swelling. She is actually significantly more swollen than last week despite the fact that her wound is doing better this is not a good trend. I think that she probably needs to see her cardiologist ASAP and I discussed that with her today. This includes the fact that honestly I think she probably is getting need an increase in her diuretic or something to try to clear away some of the excess fluid that she is experiencing at the moment. She is not been doing her pumps even twice a day but again right now more concerned that if she were to do the pump she would fluid overload her lungs causing other issues as far as her breathing is concerned. Obviously I think she needs to contact them and move up the appointment for the 12th of something sooner 2 weeks is a bit too long to wait with how swollen she has. 05/14/2021 upon evaluation today patient's wound actually showing signs of being a little bit smaller compared to previous. She has been making good progress however. Fortunately there does not appear to be any evidence of active infection which is great. Overall I am extremely pleased in that regard. Nonetheless I am still concerned that she is having quite a bit of issue as far as the swelling is concerned she has been placed on fluid restriction as well as an increase in the furosemide by her doctor. We will see how things progress. 05/21/2021 upon evaluation today patient appears to be doing well with regard to her wound in general. She has been tolerating  the dressing changes without complication. With that being said she has been using silver cell for a while and while this was showing signs of improvement it is now gotten to the point where this has slowed down quite significantly. I do believe that switching to a different dressing may be beneficial for her today. 05/28/2021 upon evaluation today patient actually appears to be doing quite well in regard to her wound. In fact there is really not any need for sharp debridement today based on what I see. The Hydrofera Blue is doing great and overall I think that she is managing quite nicely with the compression wrapping. Her edema is still down quite a bit with the wrapping in place. 06/11/2021 upon evaluation today patient appears to be doing well with regard to her leg ulcer. She has been tolerating the dressing changes without complication. Fortunately there does not appear to be any signs of active infection at this time which is great news. No fevers, chills, nausea, vomiting, or diarrhea. She is going require some sharp debridement today. 10/11; left leg ulcer much better looking and with improved surface area. She is using Hydrofera Blue under 3 layer compression. She does not have any stocking on the right leg which in itself has very significant nonpitting edema 06/25/2021  upon evaluation today patient appears to be doing well with regard to the wound on her left leg. She has been tolerating the dressing changes without complication and overall I am extremely pleased with where things stand today. I do not see any evidence of infection which is great news. 07/02/2021 upon evaluation today patient's wound actually showing signs of excellent improvement and actually very pleased with where we stand today and the overall appearance. Fortunately there does not appear to be any signs of active infection. No fevers, chills, nausea, vomiting, or diarrhea. 07/09/2021 upon evaluation today patient  appears to be doing better in regard to her wound this is measuring smaller and she is headed in the appropriate direction based on what I am seeing currently. I do not see any evidence of active infection systemically which is great news. 07/16/2021 upon evaluation today patient appears to be doing pretty well currently in regard to her leg ulcer. This is measuring smaller and looking much better this is great news. Fortunately there does not appear to be any evidence of active infection systemically which is great news as well. 07/23/2021 upon evaluation today patient's wound is actually showing signs of good improvement this is definitely measuring smaller. I do not see any signs of infection which is great news and overall very pleased in that regard. Overall I think that she is doing well with the [name], [name] ([id number]) [place]. 07/30/2020 upon evaluation today patient appears to be doing well in regard to her wound. She has been tolerating the dressing changes without complication think the Hydrofera Blue at this point is actually causing her to dry out a lot as far as the wound bed is concerned. Subsequently I think that we need to try to see what we can do to improve this. Fortunately there does not appear to be any evidence of active infection at this time which is great news. 08/06/2021 upon evaluation today patient appears to be doing well with regards to her wound. Is not measuring significantly smaller initial inspection but upon closer inspection she has a lot of new skin growth across the central portion of the wound. I think will get very close to complete resolution. 08/13/2021 upon evaluation today patient appears to be doing well with regard to her wound. In fact this appears to be I believe healed although there is still some question as to whether it is completely so. I am concerned about the fact that to be honest she still has some evidence of dry skin that could  be just trapping something underneath I still want to debride anything as I am afraid of causing some damage to the good skin for that reason I [name] probably monitor for 1 more week before completely closing everything out. 08/20/2021 upon evaluation today patient actually appears to be doing excellent. In fact she appears to be completely healed. This was the case last week as well we just wanted to make sure nothing reopened since she does not really have an ability to put on compression socks this is good to be something we need to make sure was well-healed. Nonetheless I think we have achieved that goal as of today. 12/27; this is a patient we discharged 2 weeks ago with wounds on her left lower leg laterally. She was supposed to get stockings and really did not get them. She developed blistering and reopening probably sometime late last week. She has 3 areas in the same area as previously. The mid calf  dimensions on the left went from 39 to 51 cm today. She also has very significant edema on the right leg but as far as I am aware has not had wounds in this area 1/4; patient presents for follow-up. She has tolerated the compression wrap well. She has no issues or complaints today. Electronic Signature(s) Signed: 09/11/2021 3:59:43 PM By: Kalman Shan DO Entered By: Kalman Shan on 09/11/2021 15:56:49 [name] ([id number]) -------------------------------------------------------------------------------- Physical Exam Details Patient Name: [name] Date of Service: 09/11/2021 2:15 PM Medical Record Number: [id number] Patient Account Number: [id number] Date of Birth/Sex: Oct 03, 1945 (76 y.o. F) Treating RN: Levora Dredge Primary Care Provider: [name] Other Clinician: Referring Provider: [name] Treating Provider/Extender: Yaakov Guthrie in Treatment: 34 Constitutional . Cardiovascular . Psychiatric . Notes Left lower extremity: Multiple open  wounds with nonviable tissue and granulation tissue present. No evidence of surrounding infection. Nonpitting edema. Electronic Signature(s) Signed: 09/11/2021 3:59:43 PM By: Kalman Shan DO Entered By: Kalman Shan on 09/11/2021 15:57:52 [name] ([id number]) -------------------------------------------------------------------------------- Physician Orders Details Patient Name: [name] Date of Service: 09/11/2021 2:15 PM Medical Record Number: [id number] Patient Account Number: [id number] Date of Birth/Sex: 10/22/1945 (75 y.o. F) Treating RN: Levora Dredge Primary Care Provider: [name] Other Clinician: Referring Provider: [name] Treating Provider/Extender: Yaakov Guthrie in Treatment: 62 Verbal / Phone Orders: No Diagnosis Coding Follow-up Appointments o Return Appointment in 1 week. Bathing/ Shower/ Hygiene o May shower with wound dressing protected with water repellent cover or cast protector. Edema Control - Lymphedema / Segmental Compressive Device / Other Left Lower Extremity o Optional: One layer of unna paste to top of compression wrap (to act as an anchor). o Patient to wear own compression stockings. Remove compression stockings every night before going to bed and put on every morning when getting up. - on RIGHT LEG call [facility] for compression sock 15-22 mm/Hg, measurements and phone number on blue sheet [employer] on [address] in [place] also has the compression socks o Elevate, Exercise Daily and Avoid Standing for Long Periods of Time. o Elevate legs to the level of the heart and pump ankles as often as possible o Elevate leg(s) parallel to the floor when sitting. o Compression Pump: Use compression pump on left lower extremity for 60 minutes, twice daily. - You may pump over wraps o Compression Pump: Use compression pump on right lower extremity for 60 minutes, twice daily. - You may pump  over wraps Wound Treatment Wound #3 - Lower Leg Wound Laterality: Left Primary Dressing: Hydrofera Blue Ready Transfer Foam, 4x5 (in/in) 1 x Per Week/30 Days Discharge Instructions: Apply Hydrofera Blue Ready to wound bed as directed Secondary Dressing: ABD Pad 5x9 (in/in) 1 x Per Week/30 Days Discharge Instructions: Cover with ABD pad Compression Wrap: Profore Lite LF 3 Multilayer Compression Bandaging System 1 x Per Week/30 Days Discharge Instructions: Apply 3 multi-layer wrap as prescribed. Electronic Signature(s) Signed: 09/11/2021 3:59:43 PM By: Kalman Shan DO Entered By: Kalman Shan on 09/11/2021 15:59:23 [name] ([id number]) -------------------------------------------------------------------------------- Problem List Details Patient Name: [name] Date of Service: 09/11/2021 2:15 PM Medical Record Number: [id number] Patient Account Number: [id number] Date of Birth/Sex: 01/08/1946 (76 y.o. F) Treating RN: Levora Dredge Primary Care Provider: [name] Other Clinician: Referring Provider: [name] Treating Provider/Extender: Yaakov Guthrie in Treatment: 34 Active Problems ICD-10 Encounter Code Description Active Date MDM Diagnosis I89.0 Lymphedema, not elsewhere classified 01/16/2021 No Yes I87.332 Chronic venous hypertension (idiopathic) with ulcer and inflammation of 01/16/2021 No  Yes left lower extremity L97.828 Non-pressure chronic ulcer of other part of left lower leg with other 01/16/2021 No Yes specified severity Inactive Problems Resolved Problems Electronic Signature(s) Signed: 09/11/2021 3:59:43 PM By: Kalman Shan DO Entered By: Kalman Shan on 09/11/2021 15:56:04 [name] ([id number]) -------------------------------------------------------------------------------- Progress Note Details Patient Name: [name] Date of Service: 09/11/2021 2:15 PM Medical Record Number: [id number] Patient Account Number:  [id number] Date of Birth/Sex: November 13, 1945 (76 y.o. F) Treating RN: Levora Dredge Primary Care Provider: [name] Other Clinician: Referring Provider: [name] Treating Provider/Extender: Yaakov Guthrie in Treatment: 34 Subjective Chief Complaint Information obtained from Patient Left LE Ulcer History of Present Illness (HPI) 03/15/2020 upon evaluation today patient presents today for initial evaluation here in our clinic concerning a wound that is on the left posterior lower extremity. Unfortunately this has been giving the patient some discomfort at this point which she notes has been affected her pretty much daily. The patient does have a history of venous insufficiency, hypertension, congestive heart failure, and a history of breast cancer. Fortunately there does not appear to be evidence of active infection at this time which is great news. No fevers, chills, nausea, vomiting, or diarrhea. Wound is not extremely large but does have some slough covering the surface of the wound. This is going require sharp debridement today. 03/15/2020 upon evaluation today patient appears to be doing fairly well in regard to her wound. She does have some slough noted on the surface of the wound currently but I do believe the Iodoflex has been beneficial over the past week. There is no signs of active infection at this time which is great news and overall very pleased with the progress. No fevers, chills, nausea, vomiting, or diarrhea. 04/02/2020 upon evaluation today patient appears to be doing a little better in regard to her wound size wise this is not tremendously smaller she does have some slough buildup on the surface of the wound. With that being said this is going require some sharp debridement today to clear away some of the necrotic debris. Fortunately there is no evidence of active infection at this time. No fevers, chills, nausea, vomiting, or diarrhea. 04/10/2020 on evaluation today  patient appears to be doing well with regard to her ulcer which is measuring somewhat smaller today. She actually has more granulation tissue noted which is also good news is no need for sharp debridement today. Fortunately there is no evidence of infection either which is also excellent. No fevers, chills, nausea, vomiting, or diarrhea. 04/26/2020 on evaluation today patient's wound actually is showing signs of good improvement which is great news. There does not appear to be any evidence of active infection. I do believe she is tolerating the collagen at this point. 05/03/2020 upon evaluation today patient's wound actually showed signs of good granulation at this time there does not appear to be any evidence of active infection which is great news and overall very pleased with where things stand. With that being said I do believe that the patient is can require some sharp debridement today but fortunately nothing too significant. 05/11/20 on evaluation today patient appears to be doing well in regard to her leg ulcer. She's been tolerating the dressing changes without complication. Fortunately there is no signs of active infection at this time. Overall I'm very pleased with where things stand. 05/18/2020 upon evaluation today patient's wound is showing signs of improvement is very dry and I think the collagen for the most part just dried to  the wound bed. I am going to clean this away with sharp debridement in order to get this under control in my opinion. 05/25/2020 upon evaluation today patient actually appears to be doing well in regard to her leg ulcer. In fact upon inspection it appears she could potentially be completely healed but that is not guaranteed based on what we are seeing. There does not appear to be any signs of active infection which is great news. 05/31/2020 on evaluation today patient appears to be doing well with regard to her wounds. She in fact appears to be almost completely healed  on the left leg there is just a very small opening remaining point 1 06/08/2020 upon evaluation today patient appears to be doing excellent in regard to her leg ulcer on the left in fact it appears to be that she is completely healed as of today. I see no signs of active infection at this time which is great news. No fevers, chills, nausea, vomiting, or diarrhea. READMISSION 01/16/2021 Patient that we have had in this clinic with a wound on the left posterior calf in the summer 2021 extending into October. She was felt to have chronic venous insufficiency. Per the patient's description she was discharged with what sounds like external compression stockings although she could not afford the "$75 per leg". She therefore did not get anything. Apparently her wound that she has currently is been in existence since January. She has been followed at vein and vascular with Unna boots and I think calcium alginate. She had ABIs done on 06/25/2020 that showed an noncompressible ABI on the right leg and the left leg but triphasic waveforms with good great toe pressures and a TBI of 0.90 on the right and 0.91 on the left Surprisingly the patient also had venous reflux studies that were really unremarkable. This included no evidence of DVTs or SVTs but there was no evidence of deep venous insufficiency or superficial venous insufficiency in the greater saphenous or short saphenous veins bilaterally. I would wonder about more central venous issues or perhaps this is all lymphedema 01/25/2021 upon evaluation today patient appears to be doing decently well and guard to her wound. The good news is the Iodoflex that is job she saw Dr. Dellia Nims last week for readmission and to be honest I think that this has done extremely well over that week. I do believe that she can tolerate the sharp debridement today which will be good. I think that cleaning off the wound will likely allow [place] to be able to use a different type of  dressing I do not think the Iodoflex will even be necessary based on how clean the wound looks. Fortunately there is no sign of active infection at this time which is great news. No fevers, chills, nausea, vomiting, or diarrhea. [name], [name] ([id number]) 02/12/2021 upon evaluation today patient appears to be doing well with regard to her leg ulcer. We did switch to alginate when she came for nurse visit I think is doing much better for her. Fortunately there does not appear to be any signs of active infection at this time which is great news. No fevers, chills, nausea, vomiting, or diarrhea. 02/18/2021 upon evaluation today patient's wound is actually showing signs of good improvement. I am very pleased with where things stand currently. I do not see any signs of active infection which is great news and overall I feel like she is making good progress. The patient likewise is happy that things are doing  so well and that this is measuring somewhat smaller. 02/25/2021 upon evaluation today patient actually appears to be doing quite well in regard to her wounds. She has been tolerating the dressing changes without complication. Fortunately there does not appear to be any signs of active infection which is great news and overall I am extremely pleased with where things stand. No fevers, chills, nausea, vomiting, or diarrhea. She does have a lot of edema I really do think she needs compression socks to be worn in order to prevent things from causing additional issues for her currently. Especially on the right leg she should be wearing compression socks all the time to be honest. 03/04/2021 upon evaluation today patient's leg ulcer actually appears to be doing pretty well which is great news. There does not appear to be any significant change but overall the appearance is better even though the size is not necessarily reflecting a great improvement. Fortunately there does not appear to be any signs of active  infection. No fevers, chills, nausea, vomiting, or diarrhea. 03/12/2021 upon evaluation today patient appears to be doing well with regard to her wounds. She has been tolerating the dressing changes without complication. The good news is that the wound on her left lateral leg is doing much better and is showing signs of good improvement. Overall her swelling is controlled well as well. She does need some compression socks she plans to go Jefferey socks next week. 03/18/2021 on evaluation today patient's wound actually is showing signs of good improvement. She does have a little bit of slough this can require some sharp debridement today. 03/25/2021 upon evaluation today patient appears to be doing well with regard to her wound on the left lateral leg. She has been tolerating the dressing changes without complication. Fortunately there does not appear to be any signs of active infection at this time. No fever chills no 04/01/2021 upon evaluation today patient's wound is showing signs of improvement and overall very pleased with where things stand at this point. There is no evidence of active infection which is great news as well and in general I think that she is making great progress. 04/09/2021 upon evaluation today patient appears to be doing well with regard to her ankle ulcer. She is making good progress currently which is great news. There does not appear to be any signs of infection also excellent news. In general I am extremely pleased with where things stand today. No fevers, chills, nausea, vomiting, or diarrhea. 04/23/2021 upon inspection today patient appears to be doing quite well with regard to her leg ulcer. She has been tolerating the dressing changes without complication. She is can require some sharp debridement today but overall seems to be doing excellent. 05/06/2021 upon evaluation today patient unfortunately appears to be doing poorly overall in regard to her swelling. She is actually  significantly more swollen than last week despite the fact that her wound is doing better this is not a good trend. I think that she probably needs to see her cardiologist ASAP and I discussed that with her today. This includes the fact that honestly I think she probably is getting need an increase in her diuretic or something to try to clear away some of the excess fluid that she is experiencing at the moment. She is not been doing her pumps even twice a day but again right now more concerned that if she were to do the pump she would fluid overload her lungs causing other issues as  far as her breathing is concerned. Obviously I think she needs to contact them and move up the appointment for the 12th of something sooner 2 weeks is a bit too long to wait with how swollen she has. 05/14/2021 upon evaluation today patient's wound actually showing signs of being a little bit smaller compared to previous. She has been making good progress however. Fortunately there does not appear to be any evidence of active infection which is great. Overall I am extremely pleased in that regard. Nonetheless I am still concerned that she is having quite a bit of issue as far as the swelling is concerned she has been placed on fluid restriction as well as an increase in the furosemide by her doctor. We will see how things progress. 05/21/2021 upon evaluation today patient appears to be doing well with regard to her wound in general. She has been tolerating the dressing changes without complication. With that being said she has been using silver cell for a while and while this was showing signs of improvement it is now gotten to the point where this has slowed down quite significantly. I do believe that switching to a different dressing may be beneficial for her today. 05/28/2021 upon evaluation today patient actually appears to be doing quite well in regard to her wound. In fact there is really not any need for sharp  debridement today based on what I see. The Hydrofera Blue is doing great and overall I think that she is managing quite nicely with the compression wrapping. Her edema is still down quite a bit with the wrapping in place. 06/11/2021 upon evaluation today patient appears to be doing well with regard to her leg ulcer. She has been tolerating the dressing changes without complication. Fortunately there does not appear to be any signs of active infection at this time which is great news. No fevers, chills, nausea, vomiting, or diarrhea. She is going require some sharp debridement today. 10/11; left leg ulcer much better looking and with improved surface area. She is using Hydrofera Blue under 3 layer compression. She does not have any stocking on the right leg which in itself has very significant nonpitting edema 06/25/2021 upon evaluation today patient appears to be doing well with regard to the wound on her left leg. She has been tolerating the dressing changes without complication and overall I am extremely pleased with where things stand today. I do not see any evidence of infection which is great news. 07/02/2021 upon evaluation today patient's wound actually showing signs of excellent improvement and actually very pleased with where we stand today and the overall appearance. Fortunately there does not appear to be any signs of active infection. No fevers, chills, nausea, vomiting, or diarrhea. 07/09/2021 upon evaluation today patient appears to be doing better in regard to her wound this is measuring smaller and she is headed in the appropriate direction based on what I am seeing currently. I do not see any evidence of active infection systemically which is great news. 07/16/2021 upon evaluation today patient appears to be doing pretty well currently in regard to her leg ulcer. This is measuring smaller and looking much better this is great news. Fortunately there does not appear to be any evidence of  active infection systemically which is great news [name], [name] ([id number]) as well. 07/23/2021 upon evaluation today patient's wound is actually showing signs of good improvement this is definitely measuring smaller. I do not see any signs of infection which is great news  and overall very pleased in that regard. Overall I think that she is doing well with the [facility]. 07/30/2020 upon evaluation today patient appears to be doing well in regard to her wound. She has been tolerating the dressing changes without complication think the Hydrofera Blue at this point is actually causing her to dry out a lot as far as the wound bed is concerned. Subsequently I think that we need to try to see what we can do to improve this. Fortunately there does not appear to be any evidence of active infection at this time which is great news. 08/06/2021 upon evaluation today patient appears to be doing well with regards to her wound. Is not measuring significantly smaller initial inspection but upon closer inspection she has a lot of new skin growth across the central portion of the wound. I think will get very close to complete resolution. 08/13/2021 upon evaluation today patient appears to be doing well with regard to her wound. In fact this appears to be I believe healed although there is still some question as to whether it is completely so. I am concerned about the fact that to be honest she still has some evidence of dry skin that could be just trapping something underneath I still want to debride anything as I am afraid of causing some damage to the good skin for that reason I [name] probably monitor for 1 more week before completely closing everything out. 08/20/2021 upon evaluation today patient actually appears to be doing excellent. In fact she appears to be completely healed. This was the case last week as well we just wanted to make sure nothing reopened since she does not really have an ability to  put on compression socks this is good to be something we need to make sure was well-healed. Nonetheless I think we have achieved that goal as of today. 12/27; this is a patient we discharged 2 weeks ago with wounds on her left lower leg laterally. She was supposed to get stockings and really did not get them. She developed blistering and reopening probably sometime late last week. She has 3 areas in the same area as previously. The mid calf dimensions on the left went from 39 to 51 cm today. She also has very significant edema on the right leg but as far as I am aware has not had wounds in this area 1/4; patient presents for follow-up. She has tolerated the compression wrap well. She has no issues or complaints today. Objective Constitutional Vitals Time Taken: 2:25 PM, Height: 69 in, Weight: 244 lbs, BMI: 36, Temperature: 97.7 F, Pulse: 54 bpm, Respiratory Rate: 18 breaths/min, Blood Pressure: 156/78 mmHg. General Notes: Left lower extremity: Multiple open wounds with nonviable tissue and granulation tissue present. No evidence of surrounding infection. Nonpitting edema. Integumentary (Hair, Skin) Wound #3 status is Open. Original cause of wound was Gradually Appeared. The date acquired was: 08/15/2021. The wound has been in treatment 1 weeks. The wound is located on the Left Lower Leg. The wound measures 6.8cm length x 8.1cm width x 0.1cm depth; 43.26cm^2 area and 4.326cm^3 volume. There is Fat Layer (Subcutaneous Tissue) exposed. There is no tunneling or undermining noted. There is a medium amount of serosanguineous drainage noted. There is medium (34-66%) red, pink, hyper - granulation within the wound bed. There is a medium (34-66%) amount of necrotic tissue within the wound bed including Adherent Slough. General Notes: surrounding skin appears macerated. 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 I debrided nonviable tissue. No signs of infection on exam. I recommended continuing Hydrofera Blue under 3 layer compression. Follow-up in 1 week Procedures Wound #3 Pre-procedure diagnosis of Wound #3 is a Lymphedema located on the Left Lower Leg . There was a Excisional Skin/Subcutaneous Tissue Debridement with a total area of 55.08 sq cm performed by Kalman Shan, MD. With the following instrument(s): Curette to remove Viable and Non-Viable tissue/material. Material removed includes Subcutaneous Tissue and Slough and. No specimens were taken. A time out was conducted at 15:18, prior to the start of the procedure. A Minimum amount of bleeding was controlled with Pressure. The procedure was tolerated well. Post Debridement Measurements: 6.8cm length x 8.1cm width x 0.1cm depth; 4.326cm^3 volume. Character of Wound/Ulcer Post Debridement is stable. Post procedure Diagnosis Wound #3: Same as Pre-Procedure Plan Follow-up Appointments: Return Appointment in 1 week. Bathing/ Shower/ Hygiene: May shower with wound dressing protected with water repellent cover or cast protector. Edema Control - Lymphedema / Segmental Compressive Device / Other: Optional: One layer of unna paste to top of compression wrap (to act as an anchor). Patient to wear own compression stockings. Remove compression stockings every night before going to bed and put on every morning when getting up. - on RIGHT LEG call [facility] for compression sock 15-22 mm/Hg, measurements and phone number on blue sheet [employer] on [employer] in [place] also has the compression socks Elevate, Exercise Daily and Avoid Standing for Long Periods of Time. Elevate legs to the level of the heart and  pump ankles as often as possible Elevate leg(s) parallel to the floor when sitting. Compression Pump: Use compression pump on left lower extremity for 60 minutes, twice daily. - You may pump over wraps Compression Pump: Use compression pump on right lower extremity for 60 minutes, twice daily. - You may pump over wraps WOUND #3: - Lower Leg Wound Laterality: Left Primary Dressing: Hydrofera Blue Ready Transfer Foam, 4x5 (in/in) 1 x Per Week/30 Days Discharge Instructions: Apply Hydrofera Blue Ready to wound bed as directed Secondary Dressing: ABD Pad 5x9 (in/in) 1 x Per Week/30 Days Discharge Instructions: Cover with ABD pad Compression Wrap: Profore Lite LF 3 Multilayer Compression Bandaging System 1 x Per Week/30 Days Discharge Instructions: Apply 3 multi-layer wrap as prescribed. 1. In office sharp debridement 2. Hydrofera Blue under 3 layer compression Electronic Signature(s) Signed: 09/11/2021 3:59:43 PM By: Kalman Shan DO Entered By: Kalman Shan on 09/11/2021 15:58:36 [name] ([id number]) -------------------------------------------------------------------------------- [place] Details Patient Name: [name] Date of Service: 09/11/2021 Medical Record Number: [id number] Patient Account Number: [id number] Date of Birth/Sex: November 18, 1945 (76 y.o. F) Treating RN: Levora Dredge Primary Care Provider: [name] Other Clinician: Referring Provider: [name] Treating Provider/Extender: Yaakov Guthrie in Treatment: 34 Diagnosis Coding ICD-10 Codes Code Description I89.0 Lymphedema, not elsewhere classified I87.332 Chronic venous hypertension (idiopathic) with ulcer and inflammation of left lower extremity L97.828 Non-pressure chronic ulcer of other part of left lower leg with other specified severity Facility Procedures CPT4 Code Description: 78242353 11042 - DEB SUBQ TISSUE 20 SQ CM/< Modifier: Quantity: 1 CPT4 Code Description: ICD-10  Diagnosis Description I87.332 Chronic venous hypertension (idiopathic) with ulcer and inflammation of le L97.828 Non-pressure chronic ulcer of other part of left lower leg with other  spec Modifier: ft lower extremity ified severity Quantity: CPT4 Code Description: [id number] 11045 - DEB SUBQ TISS EA ADDL 20CM Modifier: Quantity: 2 CPT4 Code Description: ICD-10 Diagnosis Description I87.332 Chronic venous hypertension (idiopathic) with ulcer and inflammation of le L97.828 Non-pressure chronic ulcer of other part of left lower leg with other spec Modifier: ft lower extremity ified severity Quantity: Physician Procedures CPT4 Code Description: [id number] 11042 - WC PHYS SUBQ TISS 20 SQ CM Modifier: Quantity: 1 CPT4 Code Description: ICD-10 Diagnosis Description I87.332 Chronic venous hypertension (idiopathic) with ulcer and inflammation of le L97.828 Non-pressure chronic ulcer of other part of left lower leg with other spec Modifier: ft lower extremity ified severity Quantity: CPT4 Code Description: [id number] 11045 - WC PHYS SUBQ TISS EA ADDL 20 CM Modifier: Quantity: 2 CPT4 Code Description: ICD-10 Diagnosis Description I87.332 Chronic venous hypertension (idiopathic) with ulcer and inflammation of le L97.828 Non-pressure chronic ulcer of other part of left lower leg with other spec Modifier: ft lower extremity ified severity Quantity: Electronic Signature(s) Signed: 09/11/2021 3:59:43 PM By: Kalman Shan DO Entered By: Kalman Shan on 09/11/2021 15:59:00

## 2021-09-16 ENCOUNTER — Inpatient Hospital Stay: Admission: RE | Admit: 2021-09-16 | Payer: Medicare Other | Source: Ambulatory Visit

## 2021-09-16 ENCOUNTER — Other Ambulatory Visit: Payer: Medicare Other

## 2021-09-17 ENCOUNTER — Other Ambulatory Visit: Payer: Self-pay

## 2021-09-17 ENCOUNTER — Encounter: Payer: Medicare Other | Admitting: Physician Assistant

## 2021-09-17 DIAGNOSIS — I890 Lymphedema, not elsewhere classified: Secondary | ICD-10-CM | POA: Diagnosis not present

## 2021-09-17 NOTE — Progress Notes (Addendum)
LANIESHA, DAS ([id number]) Visit Report for 09/17/2021 Chief Complaint Document Details Patient Name: [name], [name] Date of Service: 09/17/2021 2:45 PM Medical Record Number: [id number] Patient Account Number: [id number] Date of Birth/Sex: [birth year] (76 y.o. F) Treating RN: Carlene Coria Primary Care Provider: [name] Other Clinician: Referring Provider: [name] Treating Provider/Extender: Skipper Cliche in Treatment: 34 Information Obtained from: Patient Chief Complaint Left LE Ulcer Electronic Signature(s) Signed: 09/17/2021 3:10:41 PM By: Worthy Keeler PA-C Entered By: Worthy Keeler on 09/17/2021 15:10:40 [name] ([id number]) -------------------------------------------------------------------------------- HPI Details Patient Name: [name] Date of Service: 09/17/2021 2:45 PM Medical Record Number: [id number] Patient Account Number: [id number] Date of Birth/Sex: 08/19/1946 (76 y.o. F) Treating RN: Carlene Coria Primary Care Provider: [name] Other Clinician: Referring Provider: [name] Treating Provider/Extender: Skipper Cliche in Treatment: 34 History of Present Illness HPI Description: 03/15/2020 upon evaluation today patient presents today for initial evaluation here in our clinic concerning a wound that is on the left posterior lower extremity. Unfortunately this has been giving the patient some discomfort at this point which she notes has been affected her pretty much daily. The patient does have a history of venous insufficiency, hypertension, congestive heart failure, and a history of breast cancer. Fortunately there does not appear to be evidence of active infection at this time which is great news. No fevers, chills, nausea, vomiting, or diarrhea. Wound is not extremely large but does have some slough covering the surface of the wound. This is going require sharp debridement today. 03/15/2020 upon evaluation today patient  appears to be doing fairly well in regard to her wound. She does have some slough noted on the surface of the wound currently but I do believe the Iodoflex has been beneficial over the past week. There is no signs of active infection at this time which is great news and overall very pleased with the progress. No fevers, chills, nausea, vomiting, or diarrhea. 04/02/2020 upon evaluation today patient appears to be doing a little better in regard to her wound size wise this is not tremendously smaller she does have some slough buildup on the surface of the wound. With that being said this is going require some sharp debridement today to clear away some of the necrotic debris. Fortunately there is no evidence of active infection at this time. No fevers, chills, nausea, vomiting, or diarrhea. 04/10/2020 on evaluation today patient appears to be doing well with regard to her ulcer which is measuring somewhat smaller today. She actually has more granulation tissue noted which is also good news is no need for sharp debridement today. Fortunately there is no evidence of infection either which is also excellent. No fevers, chills, nausea, vomiting, or diarrhea. 04/26/2020 on evaluation today patient's wound actually is showing signs of good improvement which is great news. There does not appear to be any evidence of active infection. I do believe she is tolerating the collagen at this point. 05/03/2020 upon evaluation today patient's wound actually showed signs of good granulation at this time there does not appear to be any evidence of active infection which is great news and overall very pleased with where things stand. With that being said I do believe that the patient is can require some sharp debridement today but fortunately nothing too significant. 05/11/20 on evaluation today patient appears to be doing well in regard to her leg ulcer. She's been tolerating the dressing changes without complication.  Fortunately there is no signs of active infection at this time.  Overall I'm very pleased with where things stand. 05/18/2020 upon evaluation today patient's wound is showing signs of improvement is very dry and I think the collagen for the most part just dried to the wound bed. I am going to clean this away with sharp debridement in order to get this under control in my opinion. 05/25/2020 upon evaluation today patient actually appears to be doing well in regard to her leg ulcer. In fact upon inspection it appears she could potentially be completely healed but that is not guaranteed based on what we are seeing. There does not appear to be any signs of active infection which is great news. 05/31/2020 on evaluation today patient appears to be doing well with regard to her wounds. She in fact appears to be almost completely healed on the left leg there is just a very small opening remaining point 1 06/08/2020 upon evaluation today patient appears to be doing excellent in regard to her leg ulcer on the left in fact it appears to be that she is completely healed as of today. I see no signs of active infection at this time which is great news. No fevers, chills, nausea, vomiting, or diarrhea. READMISSION 01/16/2021 Patient that we have had in this clinic with a wound on the left posterior calf in the summer 2021 extending into October. She was felt to have chronic venous insufficiency. Per the patient's description she was discharged with what sounds like external compression stockings although she could not afford the "$75 per leg". She therefore did not get anything. Apparently her wound that she has currently is been in existence since January. She has been followed at vein and vascular with Unna boots and I think calcium alginate. She had ABIs done on 06/25/2020 that showed an noncompressible ABI on the right leg and the left leg but triphasic waveforms with good great toe pressures and a TBI of 0.90 on the  right and 0.91 on the left Surprisingly the patient also had venous reflux studies that were really unremarkable. This included no evidence of DVTs or SVTs but there was no evidence of deep venous insufficiency or superficial venous insufficiency in the greater saphenous or short saphenous veins bilaterally. I would wonder about more central venous issues or perhaps this is all lymphedema 01/25/2021 upon evaluation today patient appears to be doing decently well and guard to her wound. The good news is the Iodoflex that is job she saw Dr. Dellia Nims last week for readmission and to be honest I think that this has done extremely well over that week. I do believe that she can tolerate the sharp debridement today which will be good. I think that cleaning off the wound will likely allow [place] to be able to use a different type of dressing I do not think the Iodoflex will even be necessary based on how clean the wound looks. Fortunately there is no sign of active infection at this time which is great news. No fevers, chills, nausea, vomiting, or diarrhea. 02/12/2021 upon evaluation today patient appears to be doing well with regard to her leg ulcer. We did switch to alginate when she came for nurse visit I think is doing much better for her. Fortunately there does not appear to be any signs of active infection at this time which is great news. No fevers, chills, nausea, vomiting, or diarrhea. KADASIA, KASSING ([id number]) 02/18/2021 upon evaluation today patient's wound is actually showing signs of good improvement. I am very pleased with where things stand  currently. I do not see any signs of active infection which is great news and overall I feel like she is making good progress. The patient likewise is happy that things are doing so well and that this is measuring somewhat smaller. 02/25/2021 upon evaluation today patient actually appears to be doing quite well in regard to her wounds. She has been tolerating the  dressing changes without complication. Fortunately there does not appear to be any signs of active infection which is great news and overall I am extremely pleased with where things stand. No fevers, chills, nausea, vomiting, or diarrhea. She does have a lot of edema I really do think she needs compression socks to be worn in order to prevent things from causing additional issues for her currently. Especially on the right leg she should be wearing compression socks all the time to be honest. 03/04/2021 upon evaluation today patient's leg ulcer actually appears to be doing pretty well which is great news. There does not appear to be any significant change but overall the appearance is better even though the size is not necessarily reflecting a great improvement. Fortunately there does not appear to be any signs of active infection. No fevers, chills, nausea, vomiting, or diarrhea. 03/12/2021 upon evaluation today patient appears to be doing well with regard to her wounds. She has been tolerating the dressing changes without complication. The good news is that the wound on her left lateral leg is doing much better and is showing signs of good improvement. Overall her swelling is controlled well as well. She does need some compression socks she plans to go Jefferey socks next week. 03/18/2021 on evaluation today patient's wound actually is showing signs of good improvement. She does have a little bit of slough this can require some sharp debridement today. 03/25/2021 upon evaluation today patient appears to be doing well with regard to her wound on the left lateral leg. She has been tolerating the dressing changes without complication. Fortunately there does not appear to be any signs of active infection at this time. No fever chills no 04/01/2021 upon evaluation today patient's wound is showing signs of improvement and overall very pleased with where things stand at this point. There is no evidence of active  infection which is great news as well and in general I think that she is making great progress. 04/09/2021 upon evaluation today patient appears to be doing well with regard to her ankle ulcer. She is making good progress currently which is great news. There does not appear to be any signs of infection also excellent news. In general I am extremely pleased with where things stand today. No fevers, chills, nausea, vomiting, or diarrhea. 04/23/2021 upon inspection today patient appears to be doing quite well with regard to her leg ulcer. She has been tolerating the dressing changes without complication. She is can require some sharp debridement today but overall seems to be doing excellent. 05/06/2021 upon evaluation today patient unfortunately appears to be doing poorly overall in regard to her swelling. She is actually significantly more swollen than last week despite the fact that her wound is doing better this is not a good trend. I think that she probably needs to see her cardiologist ASAP and I discussed that with her today. This includes the fact that honestly I think she probably is getting need an increase in her diuretic or something to try to clear away some of the excess fluid that she is experiencing at the moment. She is  not been doing her pumps even twice a day but again right now more concerned that if she were to do the pump she would fluid overload her lungs causing other issues as far as her breathing is concerned. Obviously I think she needs to contact them and move up the appointment for the 12th of something sooner 2 weeks is a bit too long to wait with how swollen she has. 05/14/2021 upon evaluation today patient's wound actually showing signs of being a little bit smaller compared to previous. She has been making good progress however. Fortunately there does not appear to be any evidence of active infection which is great. Overall I am extremely pleased in that regard. Nonetheless I am  still concerned that she is having quite a bit of issue as far as the swelling is concerned she has been placed on fluid restriction as well as an increase in the furosemide by her doctor. We will see how things progress. 05/21/2021 upon evaluation today patient appears to be doing well with regard to her wound in general. She has been tolerating the dressing changes without complication. With that being said she has been using silver cell for a while and while this was showing signs of improvement it is now gotten to the point where this has slowed down quite significantly. I do believe that switching to a different dressing may be beneficial for her today. 05/28/2021 upon evaluation today patient actually appears to be doing quite well in regard to her wound. In fact there is really not any need for sharp debridement today based on what I see. The Hydrofera Blue is doing great and overall I think that she is managing quite nicely with the compression wrapping. Her edema is still down quite a bit with the wrapping in place. 06/11/2021 upon evaluation today patient appears to be doing well with regard to her leg ulcer. She has been tolerating the dressing changes without complication. Fortunately there does not appear to be any signs of active infection at this time which is great news. No fevers, chills, nausea, vomiting, or diarrhea. She is going require some sharp debridement today. 10/11; left leg ulcer much better looking and with improved surface area. She is using Hydrofera Blue under 3 layer compression. She does not have any stocking on the right leg which in itself has very significant nonpitting edema 06/25/2021 upon evaluation today patient appears to be doing well with regard to the wound on her left leg. She has been tolerating the dressing changes without complication and overall I am extremely pleased with where things stand today. I do not see any evidence of infection which is great  news. 07/02/2021 upon evaluation today patient's wound actually showing signs of excellent improvement and actually very pleased with where we stand today and the overall appearance. Fortunately there does not appear to be any signs of active infection. No fevers, chills, nausea, vomiting, or diarrhea. 07/09/2021 upon evaluation today patient appears to be doing better in regard to her wound this is measuring smaller and she is headed in the appropriate direction based on what I am seeing currently. I do not see any evidence of active infection systemically which is great news. 07/16/2021 upon evaluation today patient appears to be doing pretty well currently in regard to her leg ulcer. This is measuring smaller and looking much better this is great news. Fortunately there does not appear to be any evidence of active infection systemically which is great news as well.  07/23/2021 upon evaluation today patient's wound is actually showing signs of good improvement this is definitely measuring smaller. I do not see any signs of infection which is great news and overall very pleased in that regard. Overall I think that she is doing well with the Laconda Basich, [place] ([id number]) [place]. 07/30/2020 upon evaluation today patient appears to be doing well in regard to her wound. She has been tolerating the dressing changes without complication think the Hydrofera Blue at this point is actually causing her to dry out a lot as far as the wound bed is concerned. Subsequently I think that we need to try to see what we can do to improve this. Fortunately there does not appear to be any evidence of active infection at this time which is great news. 08/06/2021 upon evaluation today patient appears to be doing well with regards to her wound. Is not measuring significantly smaller initial inspection but upon closer inspection she has a lot of new skin growth across the central portion of the wound. I think will  get very close to complete resolution. 08/13/2021 upon evaluation today patient appears to be doing well with regard to her wound. In fact this appears to be I believe healed although there is still some question as to whether it is completely so. I am concerned about the fact that to be honest she still has some evidence of dry skin that could be just trapping something underneath I still want to debride anything as I am afraid of causing some damage to the good skin for that reason I Georgina Peer probably monitor for 1 more week before completely closing everything out. 08/20/2021 upon evaluation today patient actually appears to be doing excellent. In fact she appears to be completely healed. This was the case last week as well we just wanted to make sure nothing reopened since she does not really have an ability to put on compression socks this is good to be something we need to make sure was well-healed. Nonetheless I think we have achieved that goal as of today. 12/27; this is a patient we discharged 2 weeks ago with wounds on her left lower leg laterally. She was supposed to get stockings and really did not get them. She developed blistering and reopening probably sometime late last week. She has 3 areas in the same area as previously. The mid calf dimensions on the left went from 39 to 51 cm today. She also has very significant edema on the right leg but as far as I am aware has not had wounds in this area 1/4; patient presents for follow-up. She has tolerated the compression wrap well. She has no issues or complaints today. 09/17/2021 upon evaluation today patient appears to be doing well with regard to her wounds. She has been tolerating the dressing changes without complication. Fortunately there does not appear to be any signs of active infection at this time. No fevers, chills, nausea, vomiting, or diarrhea. Electronic Signature(s) Signed: 09/17/2021 3:23:26 PM By: Worthy Keeler PA-C Entered  By: Worthy Keeler on 09/17/2021 15:23:25 JESSAMYN, WATTERSON ([id number]) -------------------------------------------------------------------------------- Physical Exam Details Patient Name: [name] Date of Service: 09/17/2021 2:45 PM Medical Record Number: [id number] Patient Account Number: [id number] Date of Birth/Sex: Oct 26, 1945 (76 y.o. F) Treating RN: Carlene Coria Primary Care Provider: [name] Other Clinician: Referring Provider: [name] Treating Provider/Extender: Skipper Cliche in Treatment: 42 Constitutional Well-nourished and well-hydrated in no acute distress. Respiratory normal breathing without difficulty. Psychiatric this patient is  able to make decisions and demonstrates good insight into disease process. Alert and Oriented x 3. pleasant and cooperative. Notes Patient's wound bed is showing signs of draining quite a bit I do believe we want a switch from a ABD pad to using a Zetuvit. However I do feel like the Hydrofera Blue is a good option here plan to continue with that currently. Electronic Signature(s) Signed: 09/17/2021 3:23:43 PM By: Worthy Keeler PA-C Entered By: Worthy Keeler on 09/17/2021 15:23:43 [name] ([id number]) -------------------------------------------------------------------------------- Physician Orders Details Patient Name: [name] Date of Service: 09/17/2021 2:45 PM Medical Record Number: [id number] Patient Account Number: [id number] Date of Birth/Sex: [birth year] (76 y.o. F) Treating RN: Carlene Coria Primary Care Provider: [name] Other Clinician: Referring Provider: [name] Treating Provider/Extender: Skipper Cliche in Treatment: 66 Verbal / Phone Orders: No Diagnosis Coding ICD-10 Coding Code Description I89.0 Lymphedema, not elsewhere classified I87.332 Chronic venous hypertension (idiopathic) with ulcer and inflammation of left lower extremity L97.828 Non-pressure chronic ulcer of  other part of left lower leg with other specified severity Follow-up Appointments o Return Appointment in 1 week. Bathing/ Shower/ Hygiene o May shower with wound dressing protected with water repellent cover or cast protector. Edema Control - Lymphedema / Segmental Compressive Device / Other Left Lower Extremity o Optional: One layer of unna paste to top of compression wrap (to act as an anchor). o Patient to wear own compression stockings. Remove compression stockings every night before going to bed and put on every morning when getting up. - on RIGHT LEG call [facility] for compression sock 15-22 mm/Hg, measurements and phone number on blue sheet [employer] on [employer] in [place] also has the compression socks o Elevate, Exercise Daily and Avoid Standing for Long Periods of Time. o Elevate legs to the level of the heart and pump ankles as often as possible o Elevate leg(s) parallel to the floor when sitting. o Compression Pump: Use compression pump on left lower extremity for 60 minutes, twice daily. - You may pump over wraps o Compression Pump: Use compression pump on right lower extremity for 60 minutes, twice daily. - You may pump over wraps Wound Treatment Wound #3 - Lower Leg Wound Laterality: Left Primary Dressing: Hydrofera Blue Ready Transfer Foam, 4x5 (in/in) 1 x Per Week/30 Days Discharge Instructions: Apply Hydrofera Blue Ready to wound bed as directed Secondary Dressing: ABD Pad 5x9 (in/in) 1 x Per Week/30 Days Discharge Instructions: Cover with ABD pad Compression Wrap: Profore Lite LF 3 Multilayer Compression Bandaging System 1 x Per Week/30 Days Discharge Instructions: Apply 3 multi-layer wrap as prescribed. Electronic Signature(s) Signed: 09/17/2021 4:16:57 PM By: Carlene Coria RN Signed: 09/18/2021 12:22:51 PM By: Worthy Keeler PA-C Entered By: Carlene Coria on 09/17/2021 16:16:57 [name]  ([id number]) -------------------------------------------------------------------------------- Problem List Details Patient Name: [name] Date of Service: 09/17/2021 2:45 PM Medical Record Number: [id number] Patient Account Number: [id number] Date of Birth/Sex: Apr 23, 1946 (76 y.o. F) Treating RN: Carlene Coria Primary Care Provider: [name] Other Clinician: Referring Provider: [name] Treating Provider/Extender: Skipper Cliche in Treatment: 34 Active Problems ICD-10 Encounter Code Description Active Date MDM Diagnosis I89.0 Lymphedema, not elsewhere classified 01/16/2021 No Yes I87.332 Chronic venous hypertension (idiopathic) with ulcer and inflammation of 01/16/2021 No Yes left lower extremity L97.828 Non-pressure chronic ulcer of other part of left lower leg with other 01/16/2021 No Yes specified severity Inactive Problems Resolved Problems Electronic Signature(s) Signed: 09/17/2021 3:10:36 PM By: Worthy Keeler PA-C Entered By: Joaquim Lai  IIIMargarita Grizzle on 09/17/2021 15:10:36 CORRISSA, MARTELLO ([id number]) -------------------------------------------------------------------------------- Progress Note Details Patient Name: [name], [name] Date of Service: 09/17/2021 2:45 PM Medical Record Number: [id number] Patient Account Number: [id number] Date of Birth/Sex: [birth year] (76 y.o. F) Treating RN: Carlene Coria Primary Care Provider: [name] Other Clinician: Referring Provider: [name] Treating Provider/Extender: Skipper Cliche in Treatment: 34 Subjective Chief Complaint Information obtained from Patient Left LE Ulcer History of Present Illness (HPI) 03/15/2020 upon evaluation today patient presents today for initial evaluation here in our clinic concerning a wound that is on the left posterior lower extremity. Unfortunately this has been giving the patient some discomfort at this point which she notes has been affected her pretty much daily. The patient does  have a history of venous insufficiency, hypertension, congestive heart failure, and a history of breast cancer. Fortunately there does not appear to be evidence of active infection at this time which is great news. No fevers, chills, nausea, vomiting, or diarrhea. Wound is not extremely large but does have some slough covering the surface of the wound. This is going require sharp debridement today. 03/15/2020 upon evaluation today patient appears to be doing fairly well in regard to her wound. She does have some slough noted on the surface of the wound currently but I do believe the Iodoflex has been beneficial over the past week. There is no signs of active infection at this time which is great news and overall very pleased with the progress. No fevers, chills, nausea, vomiting, or diarrhea. 04/02/2020 upon evaluation today patient appears to be doing a little better in regard to her wound size wise this is not tremendously smaller she does have some slough buildup on the surface of the wound. With that being said this is going require some sharp debridement today to clear away some of the necrotic debris. Fortunately there is no evidence of active infection at this time. No fevers, chills, nausea, vomiting, or diarrhea. 04/10/2020 on evaluation today patient appears to be doing well with regard to her ulcer which is measuring somewhat smaller today. She actually has more granulation tissue noted which is also good news is no need for sharp debridement today. Fortunately there is no evidence of infection either which is also excellent. No fevers, chills, nausea, vomiting, or diarrhea. 04/26/2020 on evaluation today patient's wound actually is showing signs of good improvement which is great news. There does not appear to be any evidence of active infection. I do believe she is tolerating the collagen at this point. 05/03/2020 upon evaluation today patient's wound actually showed signs of good granulation at  this time there does not appear to be any evidence of active infection which is great news and overall very pleased with where things stand. With that being said I do believe that the patient is can require some sharp debridement today but fortunately nothing too significant. 05/11/20 on evaluation today patient appears to be doing well in regard to her leg ulcer. She's been tolerating the dressing changes without complication. Fortunately there is no signs of active infection at this time. Overall I'm very pleased with where things stand. 05/18/2020 upon evaluation today patient's wound is showing signs of improvement is very dry and I think the collagen for the most part just dried to the wound bed. I am going to clean this away with sharp debridement in order to get this under control in my opinion. 05/25/2020 upon evaluation today patient actually appears to be doing well in regard to her  leg ulcer. In fact upon inspection it appears she could potentially be completely healed but that is not guaranteed based on what we are seeing. There does not appear to be any signs of active infection which is great news. 05/31/2020 on evaluation today patient appears to be doing well with regard to her wounds. She in fact appears to be almost completely healed on the left leg there is just a very small opening remaining point 1 06/08/2020 upon evaluation today patient appears to be doing excellent in regard to her leg ulcer on the left in fact it appears to be that she is completely healed as of today. I see no signs of active infection at this time which is great news. No fevers, chills, nausea, vomiting, or diarrhea. READMISSION 01/16/2021 Patient that we have had in this clinic with a wound on the left posterior calf in the summer 2021 extending into October. She was felt to have chronic venous insufficiency. Per the patient's description she was discharged with what sounds like external compression stockings  although she could not afford the "$75 per leg". She therefore did not get anything. Apparently her wound that she has currently is been in existence since January. She has been followed at vein and vascular with Unna boots and I think calcium alginate. She had ABIs done on 06/25/2020 that showed an noncompressible ABI on the right leg and the left leg but triphasic waveforms with good great toe pressures and a TBI of 0.90 on the right and 0.91 on the left Surprisingly the patient also had venous reflux studies that were really unremarkable. This included no evidence of DVTs or SVTs but there was no evidence of deep venous insufficiency or superficial venous insufficiency in the greater saphenous or short saphenous veins bilaterally. I would wonder about more central venous issues or perhaps this is all lymphedema 01/25/2021 upon evaluation today patient appears to be doing decently well and guard to her wound. The good news is the Iodoflex that is job she saw Dr. Dellia Nims last week for readmission and to be honest I think that this has done extremely well over that week. I do believe that she can tolerate the sharp debridement today which will be good. I think that cleaning off the wound will likely allow [place] to be able to use a different type of dressing I do not think the Iodoflex will even be necessary based on how clean the wound looks. Fortunately there is no sign of active infection at this time which is great news. No fevers, chills, nausea, vomiting, or diarrhea. [name], [name] ([id number]) 02/12/2021 upon evaluation today patient appears to be doing well with regard to her leg ulcer. We did switch to alginate when she came for nurse visit I think is doing much better for her. Fortunately there does not appear to be any signs of active infection at this time which is great news. No fevers, chills, nausea, vomiting, or diarrhea. 02/18/2021 upon evaluation today patient's wound is actually showing  signs of good improvement. I am very pleased with where things stand currently. I do not see any signs of active infection which is great news and overall I feel like she is making good progress. The patient likewise is happy that things are doing so well and that this is measuring somewhat smaller. 02/25/2021 upon evaluation today patient actually appears to be doing quite well in regard to her wounds. She has been tolerating the dressing changes without complication. Fortunately there does  not appear to be any signs of active infection which is great news and overall I am extremely pleased with where things stand. No fevers, chills, nausea, vomiting, or diarrhea. She does have a lot of edema I really do think she needs compression socks to be worn in order to prevent things from causing additional issues for her currently. Especially on the right leg she should be wearing compression socks all the time to be honest. 03/04/2021 upon evaluation today patient's leg ulcer actually appears to be doing pretty well which is great news. There does not appear to be any significant change but overall the appearance is better even though the size is not necessarily reflecting a great improvement. Fortunately there does not appear to be any signs of active infection. No fevers, chills, nausea, vomiting, or diarrhea. 03/12/2021 upon evaluation today patient appears to be doing well with regard to her wounds. She has been tolerating the dressing changes without complication. The good news is that the wound on her left lateral leg is doing much better and is showing signs of good improvement. Overall her swelling is controlled well as well. She does need some compression socks she plans to go Jefferey socks next week. 03/18/2021 on evaluation today patient's wound actually is showing signs of good improvement. She does have a little bit of slough this can require some sharp debridement today. 03/25/2021 upon evaluation  today patient appears to be doing well with regard to her wound on the left lateral leg. She has been tolerating the dressing changes without complication. Fortunately there does not appear to be any signs of active infection at this time. No fever chills no 04/01/2021 upon evaluation today patient's wound is showing signs of improvement and overall very pleased with where things stand at this point. There is no evidence of active infection which is great news as well and in general I think that she is making great progress. 04/09/2021 upon evaluation today patient appears to be doing well with regard to her ankle ulcer. She is making good progress currently which is great news. There does not appear to be any signs of infection also excellent news. In general I am extremely pleased with where things stand today. No fevers, chills, nausea, vomiting, or diarrhea. 04/23/2021 upon inspection today patient appears to be doing quite well with regard to her leg ulcer. She has been tolerating the dressing changes without complication. She is can require some sharp debridement today but overall seems to be doing excellent. 05/06/2021 upon evaluation today patient unfortunately appears to be doing poorly overall in regard to her swelling. She is actually significantly more swollen than last week despite the fact that her wound is doing better this is not a good trend. I think that she probably needs to see her cardiologist ASAP and I discussed that with her today. This includes the fact that honestly I think she probably is getting need an increase in her diuretic or something to try to clear away some of the excess fluid that she is experiencing at the moment. She is not been doing her pumps even twice a day but again right now more concerned that if she were to do the pump she would fluid overload her lungs causing other issues as far as her breathing is concerned. Obviously I think she needs to contact them and  move up the appointment for the 12th of something sooner 2 weeks is a bit too long to wait with how swollen she  has. 05/14/2021 upon evaluation today patient's wound actually showing signs of being a little bit smaller compared to previous. She has been making good progress however. Fortunately there does not appear to be any evidence of active infection which is great. Overall I am extremely pleased in that regard. Nonetheless I am still concerned that she is having quite a bit of issue as far as the swelling is concerned she has been placed on fluid restriction as well as an increase in the furosemide by her doctor. We will see how things progress. 05/21/2021 upon evaluation today patient appears to be doing well with regard to her wound in general. She has been tolerating the dressing changes without complication. With that being said she has been using silver cell for a while and while this was showing signs of improvement it is now gotten to the point where this has slowed down quite significantly. I do believe that switching to a different dressing may be beneficial for her today. 05/28/2021 upon evaluation today patient actually appears to be doing quite well in regard to her wound. In fact there is really not any need for sharp debridement today based on what I see. The Hydrofera Blue is doing great and overall I think that she is managing quite nicely with the compression wrapping. Her edema is still down quite a bit with the wrapping in place. 06/11/2021 upon evaluation today patient appears to be doing well with regard to her leg ulcer. She has been tolerating the dressing changes without complication. Fortunately there does not appear to be any signs of active infection at this time which is great news. No fevers, chills, nausea, vomiting, or diarrhea. She is going require some sharp debridement today. 10/11; left leg ulcer much better looking and with improved surface area. She is using  Hydrofera Blue under 3 layer compression. She does not have any stocking on the right leg which in itself has very significant nonpitting edema 06/25/2021 upon evaluation today patient appears to be doing well with regard to the wound on her left leg. She has been tolerating the dressing changes without complication and overall I am extremely pleased with where things stand today. I do not see any evidence of infection which is great news. 07/02/2021 upon evaluation today patient's wound actually showing signs of excellent improvement and actually very pleased with where we stand today and the overall appearance. Fortunately there does not appear to be any signs of active infection. No fevers, chills, nausea, vomiting, or diarrhea. 07/09/2021 upon evaluation today patient appears to be doing better in regard to her wound this is measuring smaller and she is headed in the appropriate direction based on what I am seeing currently. I do not see any evidence of active infection systemically which is great news. 07/16/2021 upon evaluation today patient appears to be doing pretty well currently in regard to her leg ulcer. This is measuring smaller and looking much better this is great news. Fortunately there does not appear to be any evidence of active infection systemically which is great news [name], [name] ([id number]) as well. 07/23/2021 upon evaluation today patient's wound is actually showing signs of good improvement this is definitely measuring smaller. I do not see any signs of infection which is great news and overall very pleased in that regard. Overall I think that she is doing well with the [facility]. 07/30/2020 upon evaluation today patient appears to be doing well in regard to her wound. She has been tolerating the  dressing changes without complication think the Hydrofera Blue at this point is actually causing her to dry out a lot as far as the wound bed is concerned. Subsequently  I think that we need to try to see what we can do to improve this. Fortunately there does not appear to be any evidence of active infection at this time which is great news. 08/06/2021 upon evaluation today patient appears to be doing well with regards to her wound. Is not measuring significantly smaller initial inspection but upon closer inspection she has a lot of new skin growth across the central portion of the wound. I think will get very close to complete resolution. 08/13/2021 upon evaluation today patient appears to be doing well with regard to her wound. In fact this appears to be I believe healed although there is still some question as to whether it is completely so. I am concerned about the fact that to be honest she still has some evidence of dry skin that could be just trapping something underneath I still want to debride anything as I am afraid of causing some damage to the good skin for that reason I Georgina Peer probably monitor for 1 more week before completely closing everything out. 08/20/2021 upon evaluation today patient actually appears to be doing excellent. In fact she appears to be completely healed. This was the case last week as well we just wanted to make sure nothing reopened since she does not really have an ability to put on compression socks this is good to be something we need to make sure was well-healed. Nonetheless I think we have achieved that goal as of today. 12/27; this is a patient we discharged 2 weeks ago with wounds on her left lower leg laterally. She was supposed to get stockings and really did not get them. She developed blistering and reopening probably sometime late last week. She has 3 areas in the same area as previously. The mid calf dimensions on the left went from 39 to 51 cm today. She also has very significant edema on the right leg but as far as I am aware has not had wounds in this area 1/4; patient presents for follow-up. She has tolerated the  compression wrap well. She has no issues or complaints today. 09/17/2021 upon evaluation today patient appears to be doing well with regard to her wounds. She has been tolerating the dressing changes without complication. Fortunately there does not appear to be any signs of active infection at this time. No fevers, chills, nausea, vomiting, or diarrhea. Objective Constitutional Well-nourished and well-hydrated in no acute distress. Vitals Time Taken: 3:08 PM, Height: 69 in, Weight: 244 lbs, BMI: 36, Temperature: 98.2 F, Pulse: 66 bpm, Respiratory Rate: 18 breaths/min, Blood Pressure: 149/75 mmHg. Respiratory normal breathing without difficulty. Psychiatric this patient is able to make decisions and demonstrates good insight into disease process. Alert and Oriented x 3. pleasant and cooperative. General Notes: Patient's wound bed is showing signs of draining quite a bit I do believe we want a switch from a ABD pad to using a Zetuvit. However I do feel like the Hydrofera Blue is a good option here plan to continue with that currently. Integumentary (Hair, Skin) Wound #3 status is Open. Original cause of wound was Gradually Appeared. The date acquired was: 08/15/2021. The wound has been in treatment 2 weeks. The wound is located on the Left Lower Leg. The wound measures 8cm length x 8cm width x 0.1cm depth; 50.265cm^2 area and 5.027cm^3  volume. There is Fat Layer (Subcutaneous Tissue) exposed. There is no tunneling or undermining noted. There is a medium amount of serosanguineous drainage noted. There is medium (34-66%) red, pink, hyper - granulation within the wound bed. There is a medium (34-66%) amount of necrotic tissue within the wound bed including Adherent Slough. 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 There was a Three Layer Compression Therapy Procedure by Carlene Coria, RN. Post procedure Diagnosis Wound #3: Same as Pre-Procedure Plan 1. I [name] continue with the [facility] followed by Zetuvit to cover. 2. I am also can recommend that we go ahead and continue with the compression wrapping. Working to be utilizing the 3 layer compression wrap which previously did well for her. If that does not seem to be doing the job we can always switch to a 4-layer compression wrap at that point. We will see patient back for reevaluation in 1 week here in the clinic. If anything worsens or changes patient will contact our office for additional recommendations. Electronic Signature(s) Signed: 09/17/2021 3:24:33 PM By: Worthy Keeler PA-C Entered By: Worthy Keeler on 09/17/2021 15:24:33 [name] ([id number]) -------------------------------------------------------------------------------- SuperBill Details Patient Name: [name] Date of Service: 09/17/2021 Medical Record Number: [id number] Patient Account Number: [id number] Date of Birth/Sex: [birth year] (76 y.o. F) Treating RN: Carlene Coria Primary Care Provider: [name] Other Clinician: Referring Provider: [name] Treating Provider/Extender: Skipper Cliche in Treatment: 34 Diagnosis Coding ICD-10 Codes Code Description I89.0 Lymphedema, not elsewhere classified I87.332 Chronic venous hypertension (idiopathic) with ulcer and inflammation of left lower extremity L97.828 Non-pressure chronic ulcer of other part of left lower leg with other specified severity Facility Procedures CPT4 Code: [id number] Description: (Facility Use Only) [id number] - Blanket LWR LT LEG Modifier: Quantity: 1 Physician Procedures CPT4 Code Description: [id number] -  WC PHYS LEVEL 3 - EST PT Modifier: Quantity: 1 CPT4 Code Description: ICD-10 Diagnosis Description I89.0 Lymphedema, not elsewhere classified I87.332 Chronic venous hypertension (idiopathic) with ulcer and inflammation of le L97.828 Non-pressure chronic ulcer of other part of left lower leg with  other spec Modifier: ft lower extremity ified severity Quantity: Electronic Signature(s) Signed: 09/17/2021 4:17:33 PM By: Carlene Coria RN Signed: 09/18/2021 12:22:51 PM By: Worthy Keeler PA-C Previous Signature: 09/17/2021 3:25:02 PM Version By: Worthy Keeler PA-C Entered By: Carlene Coria on 09/17/2021 16:17:32

## 2021-09-18 ENCOUNTER — Ambulatory Visit: Payer: Medicare Other | Attending: Hospice and Palliative Medicine | Admitting: Occupational Therapy

## 2021-09-18 ENCOUNTER — Telehealth: Payer: Self-pay | Admitting: Oncology

## 2021-09-18 NOTE — Telephone Encounter (Signed)
Attempt made to reach patient in regards to appointment on 1/12 to see the NP. Patient rescheduled her mammogram so this appointment with the NP needs to be moved as well. No answer and unable to leave a VM--jcs

## 2021-09-19 ENCOUNTER — Inpatient Hospital Stay: Payer: Medicare Other | Attending: Oncology | Admitting: Nurse Practitioner

## 2021-09-19 NOTE — Progress Notes (Signed)
[name], [name] ([id number]) Visit Report for 09/17/2021 Arrival Information Details Patient Name: [name], [name] Date of Service: 09/17/2021 2:45 PM Medical Record Number: [id number] Patient Account Number: [id number] Date of Birth/Sex: 24-Jan-1946 (76 y.o. F) Treating RN: Carlene Coria Primary Care Breyson Kelm: [name] Other Clinician: Referring Veeda Virgo: [name] Treating Keefe Zawistowski/Extender: Skipper Cliche in Treatment: 37 Visit Information History Since Last Visit All ordered tests and consults were completed: No Patient Arrived: Wheel Chair Added or deleted any medications: No Arrival Time: 15:07 Any new allergies or adverse reactions: No Accompanied By: self Had a fall or experienced change in No Transfer Assistance: None activities of daily living that may affect Patient Identification Verified: Yes risk of falls: Secondary Verification Process Completed: Yes Signs or symptoms of abuse/neglect since last visito No Patient Requires Transmission-Based Precautions: No Hospitalized since last visit: No Patient Has Alerts: Yes Implantable device outside of the clinic excluding No Patient Alerts: NOT DIABETIC cellular tissue based products placed in the center since last visit: Has Dressing in Place as Prescribed: Yes Has Compression in Place as Prescribed: Yes Pain Present Now: No Electronic Signature(s) Signed: 09/18/2021 6:47:24 PM By: Carlene Coria RN Entered By: Carlene Coria on 09/17/2021 15:08:11 [name] ([id number]) -------------------------------------------------------------------------------- Clinic Level of Care Assessment Details Patient Name: [name] Date of Service: 09/17/2021 2:45 PM Medical Record Number: [id number] Patient Account Number: [id number] Date of Birth/Sex: 29-Jun-1946 (76 y.o. F) Treating RN: Carlene Coria Primary Care Brain Honeycutt: [name] Other Clinician: Referring Seferino Oscar: [name] Treating Cyera Balboni/Extender:  Skipper Cliche in Treatment: 34 Clinic Level of Care Assessment Items TOOL 1 Quantity Score []  - Use when EandM and Procedure is performed on INITIAL visit 0 ASSESSMENTS - Nursing Assessment / Reassessment []  - General Physical Exam (combine w/ comprehensive assessment (listed just below) when performed on new 0 pt. evals) []  - 0 Comprehensive Assessment (HX, ROS, Risk Assessments, Wounds Hx, etc.) ASSESSMENTS - Wound and Skin Assessment / Reassessment []  - Dermatologic / Skin Assessment (not related to wound area) 0 ASSESSMENTS - Ostomy and/or Continence Assessment and Care []  - Incontinence Assessment and Management 0 []  - 0 Ostomy Care Assessment and Management (repouching, etc.) PROCESS - Coordination of Care []  - Simple Patient / Family Education for ongoing care 0 []  - 0 Complex (extensive) Patient / Family Education for ongoing care []  - 0 Staff obtains Programmer, systems, Records, Test Results / Process Orders []  - 0 Staff telephones HHA, Nursing Homes / Clarify orders / etc []  - 0 Routine Transfer to another Facility (non-emergent condition) []  - 0 Routine Hospital Admission (non-emergent condition) []  - 0 New Admissions / Biomedical engineer / Ordering NPWT, Apligraf, etc. []  - 0 Emergency Hospital Admission (emergent condition) PROCESS - Special Needs []  - Pediatric / Minor Patient Management 0 []  - 0 Isolation Patient Management []  - 0 Hearing / Language / Visual special needs []  - 0 Assessment of Community assistance (transportation, D/C planning, etc.) []  - 0 Additional assistance / Altered mentation []  - 0 Support Surface(s) Assessment (bed, cushion, seat, etc.) INTERVENTIONS - Miscellaneous []  - External ear exam 0 []  - 0 Patient Transfer (multiple staff / Civil Service fast streamer / Similar devices) []  - 0 Simple Staple / Suture removal (25 or less) []  - 0 Complex Staple / Suture removal (26 or more) []  - 0 Hypo/Hyperglycemic Management (do not check if billed  separately) []  - 0 Ankle / Brachial Index (ABI) - do not check if billed separately Has the patient been seen at the hospital within the  last three years: Yes Total Score: 0 Level Of Care: ____ [name] ([id number]) Electronic Signature(s) Signed: 09/18/2021 6:47:24 PM By: Carlene Coria RN Entered By: Carlene Coria on 09/17/2021 16:17:18 [name] ([id number]) -------------------------------------------------------------------------------- Compression Therapy Details Patient Name: [name] Date of Service: 09/17/2021 2:45 PM Medical Record Number: [id number] Patient Account Number: [id number] Date of Birth/Sex: 27-Aug-1946 (75 y.o. F) Treating RN: Carlene Coria Primary Care Izza Bickle: [name] Other Clinician: Referring Matisse Salais: [name] Treating Tvisha Schwoerer/Extender: Skipper Cliche in Treatment: 34 Compression Therapy Performed for Wound Assessment: Wound #3 Left Lower Leg Performed By: Clinician Carlene Coria, RN Compression Type: Three Layer Post Procedure Diagnosis Same as Pre-procedure Electronic Signature(s) Signed: 09/18/2021 6:47:24 PM By: Carlene Coria RN Entered By: Carlene Coria on 09/17/2021 15:20:03 [name] ([id number]) -------------------------------------------------------------------------------- Encounter Discharge Information Details Patient Name: [name] Date of Service: 09/17/2021 2:45 PM Medical Record Number: [id number] Patient Account Number: [id number] Date of Birth/Sex: [birth year] (76 y.o. F) Treating RN: Carlene Coria Primary Care Makayla Lanter: [name] Other Clinician: Referring Gavriel Holzhauer: [name] Treating Railey Glad/Extender: Skipper Cliche in Treatment: 57 Encounter Discharge Information Items Discharge Condition: Stable Ambulatory Status: Wheelchair Discharge Destination: Home Transportation: Private Auto Accompanied By: self Schedule Follow-up Appointment: Yes Clinical Summary of Care: Patient  Declined Electronic Signature(s) Signed: 09/17/2021 4:18:44 PM By: Carlene Coria RN Entered By: Carlene Coria on 09/17/2021 16:18:44 [name] ([id number]) -------------------------------------------------------------------------------- Lower Extremity Assessment Details Patient Name: [name] Date of Service: 09/17/2021 2:45 PM Medical Record Number: [id number] Patient Account Number: [id number] Date of Birth/Sex: 18-Sep-1945 (76 y.o. F) Treating RN: Carlene Coria Primary Care Almee Pelphrey: [name] Other Clinician: Referring Thomasenia Dowse: [name] Treating Madalen Gavin/Extender: Skipper Cliche in Treatment: 34 Edema Assessment Assessed: [Left: No] [Right: No] Edema: [Left: Ye] [Right: s] Calf Left: Right: Point of Measurement: 27 cm From Medial Instep 48 cm Ankle Left: Right: Point of Measurement: 10 cm From Medial Instep 26 cm Vascular Assessment Pulses: Dorsalis Pedis Palpable: [Left:Yes] Electronic Signature(s) Signed: 09/18/2021 6:47:24 PM By: Carlene Coria RN Entered By: Carlene Coria on 09/17/2021 15:14:01 [name] ([id number]) -------------------------------------------------------------------------------- Multi Wound Chart Details Patient Name: [name] Date of Service: 09/17/2021 2:45 PM Medical Record Number: [id number] Patient Account Number: [id number] Date of Birth/Sex: Dec 01, 1945 (75 y.o. F) Treating RN: Carlene Coria Primary Care Jaymarion Trombly: [name] Other Clinician: Referring Richa Shor: [name] Treating Mahogony Gilchrest/Extender: Skipper Cliche in Treatment: 34 Vital Signs Height(in): 69 Pulse(bpm): 76 Weight(lbs): 244 Blood Pressure(mmHg): 149/75 Body Mass Index(BMI): 36 Temperature(F): 98.2 Respiratory Rate(breaths/min): 18 Photos: [N/A:N/A] Wound Location: Left Lower Leg N/A N/A Wounding Event: Gradually Appeared N/A N/A Primary Etiology: Lymphedema N/A N/A Comorbid History: Cataracts, Lymphedema, Sleep N/A N/A Apnea,  Congestive Heart Failure, Hypertension, Osteoarthritis, Neuropathy, Received Chemotherapy, Received Radiation Date Acquired: 08/15/2021 N/A N/A Weeks of Treatment: 2 N/A N/A Wound Status: Open N/A N/A Measurements L x W x D (cm) 8x8x0.1 N/A N/A Area (cm) : 50.265 N/A N/A Volume (cm) : 5.027 N/A N/A % Reduction in Area: 16.30% N/A N/A % Reduction in Volume: 16.30% N/A N/A Classification: Full Thickness Without Exposed N/A N/A Support Structures Exudate Amount: Medium N/A N/A Exudate Type: Serosanguineous N/A N/A Exudate Color: red, brown N/A N/A Granulation Amount: Medium (34-66%) N/A N/A Granulation Quality: Red, Pink, Hyper-granulation N/A N/A Necrotic Amount: Medium (34-66%) N/A N/A Exposed Structures: Fat Layer (Subcutaneous Tissue): N/A N/A Yes Fascia: No Tendon: No Muscle: No Joint: No Bone: No Epithelialization: None N/A N/A Treatment Notes Electronic Signature(s) Signed: 09/18/2021 6:47:24 PM By: Carlene Coria RN Entered By: Dolores Lory  Carrie on 09/17/2021 15:19:28 [name], [name] ([id number]) -------------------------------------------------------------------------------- Multi-Disciplinary Care Plan Details Patient Name: [name], [name] Date of Service: 09/17/2021 2:45 PM Medical Record Number: [id number] Patient Account Number: [id number] Date of Birth/Sex: 12-24-1945 (76 y.o. F) Treating RN: Carlene Coria Primary Care Jaliya Siegmann: [name] Other Clinician: Referring Kimmora Risenhoover: [name] Treating Noah Lembke/Extender: Skipper Cliche in Treatment: 70 Active Inactive Electronic Signature(s) Signed: 09/18/2021 6:47:24 PM By: Carlene Coria RN Entered By: Carlene Coria on 09/17/2021 15:19:16 [name] ([id number]) -------------------------------------------------------------------------------- Pain Assessment Details Patient Name: [name] Date of Service: 09/17/2021 2:45 PM Medical Record Number: [id number] Patient Account Number: [id number] Date of  Birth/Sex: 1945-12-27 (76 y.o. F) Treating RN: Carlene Coria Primary Care Rushton Early: [name] Other Clinician: Referring Jasai Sorg: [name] Treating Bradin Mcadory/Extender: Skipper Cliche in Treatment: 34 Active Problems Location of Pain Severity and Description of Pain Patient Has Paino No Site Locations Pain Management and Medication Current Pain Management: Electronic Signature(s) Signed: 09/18/2021 6:47:24 PM By: Carlene Coria RN Entered By: Carlene Coria on 09/17/2021 15:09:07 [name] ([id number]) -------------------------------------------------------------------------------- Patient/Caregiver Education Details Patient Name: [name] Date of Service: 09/17/2021 2:45 PM Medical Record Number: [id number] Patient Account Number: [id number] Date of Birth/Gender: 1946-02-19 (75 y.o. F) Treating RN: Carlene Coria Primary Care Physician: [name] Other Clinician: Referring Physician: [name] Treating Physician/Extender: Skipper Cliche in Treatment: 48 Education Assessment Education Provided To: Patient Education Topics Provided Wound/Skin Impairment: Methods: Explain/Verbal Responses: State content correctly Electronic Signature(s) Signed: 09/18/2021 6:47:24 PM By: Carlene Coria RN Entered By: Carlene Coria on 09/17/2021 16:17:43 [name] ([id number]) -------------------------------------------------------------------------------- Wound Assessment Details Patient Name: [name] Date of Service: 09/17/2021 2:45 PM Medical Record Number: [id number] Patient Account Number: [id number] Date of Birth/Sex: 01/06/1946 (76 y.o. F) Treating RN: Carlene Coria Primary Care Dewarren Ledbetter: [name] Other Clinician: Referring Brynlee Pennywell: [name] Treating Jessel Gettinger/Extender: Skipper Cliche in Treatment: 34 Wound Status Wound Number: 3 Primary Lymphedema Etiology: Wound Location: Left Lower Leg Wound Open Wounding Event: Gradually  Appeared Status: Date Acquired: 08/15/2021 Comorbid Cataracts, Lymphedema, Sleep Apnea, Congestive Heart Weeks Of Treatment: 2 History: Failure, Hypertension, Osteoarthritis, Neuropathy, Clustered Wound: No Received Chemotherapy, Received Radiation Photos Wound Measurements Length: (cm) 8 Width: (cm) 8 Depth: (cm) 0.1 Area: (cm) 50.265 Volume: (cm) 5.027 % Reduction in Area: 16.3% % Reduction in Volume: 16.3% Epithelialization: None Tunneling: No Undermining: No Wound Description Classification: Full Thickness Without Exposed Support Structu Exudate Amount: Medium Exudate Type: Serosanguineous Exudate Color: red, brown res Foul Odor After Cleansing: No Slough/Fibrino Yes Wound Bed Granulation Amount: Medium (34-66%) Exposed Structure Granulation Quality: Red, Pink, Hyper-granulation Fascia Exposed: No Necrotic Amount: Medium (34-66%) Fat Layer (Subcutaneous Tissue) Exposed: Yes Necrotic Quality: Adherent Slough Tendon Exposed: No Muscle Exposed: No Joint Exposed: No Bone Exposed: No Treatment Notes Wound #3 (Lower Leg) Wound Laterality: Left Cleanser Peri-Wound Care Topical Primary Dressing [name], [name] ([id number]) Hydrofera Blue Ready Transfer Foam, 4x5 (in/in) Discharge Instruction: Apply Hydrofera Blue Ready to wound bed as directed Secondary Dressing ABD Pad 5x9 (in/in) Discharge Instruction: Cover with ABD pad Secured With Compression Wrap Profore Lite LF 3 Multilayer Compression Bandaging System Discharge Instruction: Apply 3 multi-layer wrap as prescribed. Compression Stockings Add-Ons Electronic Signature(s) Signed: 09/18/2021 6:47:24 PM By: Carlene Coria RN Entered By: Carlene Coria on 09/17/2021 15:13:12 [name] ([id number]) -------------------------------------------------------------------------------- Vitals Details Patient Name: [name] Date of Service: 09/17/2021 2:45 PM Medical Record Number: [id number] Patient  Account Number: [id number] Date of Birth/Sex: 03-03-1946 (75 y.o. F) Treating RN: Carlene Coria Primary Care Jakhari Space: [name] Other Clinician:  Referring Cailen Mihalik: [name] Treating Shevy Yaney/Extender: Skipper Cliche in Treatment: 34 Vital Signs Time Taken: 15:08 Temperature (F): 98.2 Height (in): 69 Pulse (bpm): 66 Weight (lbs): 244 Respiratory Rate (breaths/min): 18 Body Mass Index (BMI): 36 Blood Pressure (mmHg): 149/75 Reference Range: 80 - 120 mg / dl Electronic Signature(s) Signed: 09/18/2021 6:47:24 PM By: Carlene Coria RN Entered By: Carlene Coria on 09/17/2021 15:08:51

## 2021-09-24 ENCOUNTER — Other Ambulatory Visit: Payer: Self-pay

## 2021-09-24 DIAGNOSIS — I890 Lymphedema, not elsewhere classified: Secondary | ICD-10-CM | POA: Diagnosis not present

## 2021-09-24 NOTE — Progress Notes (Signed)
[name], [name] ([id number]) Visit Report for 09/24/2021 Arrival Information Details Patient Name: [name], [name] Date of Service: 09/24/2021 2:45 PM Medical Record Number: [id number] Patient Account Number: [id number] Date of Birth/Sex: October 15, 1945 (76 y.o. F) Treating RN: Carlene Coria Primary Care Tabita Corbo: [name] Other Clinician: Referring Kaylor Maiers: [name] Treating Judy Goodenow/Extender: Skipper Cliche in Treatment: 29 Visit Information History Since Last Visit All ordered tests and consults were completed: No Patient Arrived: Wheel Chair Added or deleted any medications: No Arrival Time: 14:50 Any new allergies or adverse reactions: No Accompanied By: self Had a fall or experienced change in No Transfer Assistance: None activities of daily living that may affect Patient Identification Verified: Yes risk of falls: Secondary Verification Process Completed: Yes Signs or symptoms of abuse/neglect since last visito No Patient Requires Transmission-Based Precautions: No Hospitalized since last visit: No Patient Has Alerts: Yes Implantable device outside of the clinic excluding No Patient Alerts: NOT DIABETIC cellular tissue based products placed in the center since last visit: Has Dressing in Place as Prescribed: Yes Pain Present Now: No Electronic Signature(s) Signed: 09/24/2021 3:09:13 PM By: Carlene Coria RN Entered By: Carlene Coria on 09/24/2021 15:09:12 [name] ([id number]) -------------------------------------------------------------------------------- Clinic Level of Care Assessment Details Patient Name: [name] Date of Service: 09/24/2021 2:45 PM Medical Record Number: [id number] Patient Account Number: [id number] Date of Birth/Sex: 10/20/1945 (76 y.o. F) Treating RN: Carlene Coria Primary Care Jayleigh Notarianni: [name] Other Clinician: Referring Jene Oravec: [name] Treating Albeiro Trompeter/Extender: Skipper Cliche in Treatment: 35 Clinic  Level of Care Assessment Items TOOL 1 Quantity Score []  - Use when EandM and Procedure is performed on INITIAL visit 0 ASSESSMENTS - Nursing Assessment / Reassessment []  - General Physical Exam (combine w/ comprehensive assessment (listed just below) when performed on new 0 pt. evals) []  - 0 Comprehensive Assessment (HX, ROS, Risk Assessments, Wounds Hx, etc.) ASSESSMENTS - Wound and Skin Assessment / Reassessment []  - Dermatologic / Skin Assessment (not related to wound area) 0 ASSESSMENTS - Ostomy and/or Continence Assessment and Care []  - Incontinence Assessment and Management 0 []  - 0 Ostomy Care Assessment and Management (repouching, etc.) PROCESS - Coordination of Care []  - Simple Patient / Family Education for ongoing care 0 []  - 0 Complex (extensive) Patient / Family Education for ongoing care []  - 0 Staff obtains Programmer, systems, Records, Test Results / Process Orders []  - 0 Staff telephones HHA, Nursing Homes / Clarify orders / etc []  - 0 Routine Transfer to another Facility (non-emergent condition) []  - 0 Routine Hospital Admission (non-emergent condition) []  - 0 New Admissions / Biomedical engineer / Ordering NPWT, Apligraf, etc. []  - 0 Emergency Hospital Admission (emergent condition) PROCESS - Special Needs []  - Pediatric / Minor Patient Management 0 []  - 0 Isolation Patient Management []  - 0 Hearing / Language / Visual special needs []  - 0 Assessment of Community assistance (transportation, D/C planning, etc.) []  - 0 Additional assistance / Altered mentation []  - 0 Support Surface(s) Assessment (bed, cushion, seat, etc.) INTERVENTIONS - Miscellaneous []  - External ear exam 0 []  - 0 Patient Transfer (multiple staff / Civil Service fast streamer / Similar devices) []  - 0 Simple Staple / Suture removal (25 or less) []  - 0 Complex Staple / Suture removal (26 or more) []  - 0 Hypo/Hyperglycemic Management (do not check if billed separately) []  - 0 Ankle / Brachial Index  (ABI) - do not check if billed separately Has the patient been seen at the hospital within the last three years: Yes Total Score: 0  Level Of Care: ____ [name] ([id number]) Electronic Signature(s) Signed: 09/24/2021 3:42:59 PM By: Carlene Coria RN Entered By: Carlene Coria on 09/24/2021 15:11:05 [name] ([id number]) -------------------------------------------------------------------------------- Compression Therapy Details Patient Name: [name] Date of Service: 09/24/2021 2:45 PM Medical Record Number: [id number] Patient Account Number: [id number] Date of Birth/Sex: February 24, 1946 (76 y.o. F) Treating RN: Carlene Coria Primary Care Trequan Marsolek: [name] Other Clinician: Referring Shevon Sian: [name] Treating Jobeth Pangilinan/Extender: Skipper Cliche in Treatment: 35 Compression Therapy Performed for Wound Assessment: Wound #3 Left Lower Leg Performed By: Clinician Carlene Coria, RN Compression Type: Three Layer Electronic Signature(s) Signed: 09/24/2021 3:11:38 PM By: Carlene Coria RN Entered By: Carlene Coria on 09/24/2021 15:11:38 [name] ([id number]) -------------------------------------------------------------------------------- Encounter Discharge Information Details Patient Name: [name] Date of Service: 09/24/2021 2:45 PM Medical Record Number: [id number] Patient Account Number: [id number] Date of Birth/Sex: 14-Sep-1945 (76 y.o. F) Treating RN: Carlene Coria Primary Care Luba Matzen: [name] Other Clinician: Referring Jisella Ashenfelter: [name] Treating Kerly Rigsbee/Extender: Skipper Cliche in Treatment: 19 Encounter Discharge Information Items Discharge Condition: Stable Ambulatory Status: Wheelchair Discharge Destination: Home Transportation: Private Auto Accompanied By: self Schedule Follow-up Appointment: Yes Clinical Summary of Care: Patient Declined Electronic Signature(s) Signed: 09/24/2021 3:10:49 PM By: Carlene Coria RN Entered By:  Carlene Coria on 09/24/2021 15:10:48 [name] ([id number]) -------------------------------------------------------------------------------- Wound Assessment Details Patient Name: [name] Date of Service: 09/24/2021 2:45 PM Medical Record Number: [id number] Patient Account Number: [id number] Date of Birth/Sex: 1946-06-27 (75 y.o. F) Treating RN: Carlene Coria Primary Care Aalivia Mcgraw: [name] Other Clinician: Referring Oliviarose Punch: [name] Treating Donnovan Stamour/Extender: Skipper Cliche in Treatment: 35 Wound Status Wound Number: 3 Primary Lymphedema Etiology: Wound Location: Left Lower Leg Wound Open Wounding Event: Gradually Appeared Status: Date Acquired: 08/15/2021 Comorbid Cataracts, Lymphedema, Sleep Apnea, Congestive Heart Weeks Of Treatment: 3 History: Failure, Hypertension, Osteoarthritis, Neuropathy, Clustered Wound: No Received Chemotherapy, Received Radiation Wound Measurements Length: (cm) 8 Width: (cm) 8 Depth: (cm) 0.1 Area: (cm) 50.265 Volume: (cm) 5.027 % Reduction in Area: 16.3% % Reduction in Volume: 16.3% Epithelialization: None Tunneling: No Undermining: No Wound Description Classification: Full Thickness Without Exposed Support Structu Exudate Amount: Medium Exudate Type: Serosanguineous Exudate Color: red, brown res Foul Odor After Cleansing: No Slough/Fibrino Yes Wound Bed Granulation Amount: Medium (34-66%) Exposed Structure Granulation Quality: Red, Pink, Hyper-granulation Fascia Exposed: No Necrotic Amount: Medium (34-66%) Fat Layer (Subcutaneous Tissue) Exposed: Yes Necrotic Quality: Adherent Slough Tendon Exposed: No Muscle Exposed: No Joint Exposed: No Bone Exposed: No Treatment Notes Wound #3 (Lower Leg) Wound Laterality: Left Cleanser Peri-Wound Care Topical Primary Dressing Hydrofera Blue Ready Transfer Foam, 4x5 (in/in) Discharge Instruction: Apply Hydrofera Blue Ready to wound bed as directed Secondary  Dressing ABD Pad 5x9 (in/in) Discharge Instruction: Cover with ABD pad Secured With Compression Wrap Profore Lite LF 3 Multilayer Compression Bandaging System Discharge Instruction: Apply 3 multi-layer wrap as prescribed. Compression Stockings [place], [place] ([id number]) Add-Ons Electronic Signature(s) Signed: 09/24/2021 3:09:40 PM By: Carlene Coria RN Entered By: Carlene Coria on 09/24/2021 15:09:40

## 2021-09-25 NOTE — Progress Notes (Signed)
RUTHANNE, MCNEISH ([id number]) Visit Report for 09/24/2021 Physician Orders Details Patient Name: [name], [name] Date of Service: 09/24/2021 2:45 PM Medical Record Number: [id number] Patient Account Number: [id number] Date of Birth/Sex: [birth year] (76 y.o. F) Treating RN: Carlene Coria Primary Care Provider: [name] Other Clinician: Referring Provider: [name] Treating Provider/Extender: Skipper Cliche in Treatment: 56 Verbal / Phone Orders: No Diagnosis Coding Follow-up Appointments o Return Appointment in 1 week. Bathing/ Shower/ Hygiene o May shower with wound dressing protected with water repellent cover or cast protector. Edema Control - Lymphedema / Segmental Compressive Device / Other Left Lower Extremity o Optional: One layer of unna paste to top of compression wrap (to act as an anchor). o Patient to wear own compression stockings. Remove compression stockings every night before going to bed and put on every morning when getting up. - on RIGHT LEG call [employer] for compression sock 15-22 mm/Hg, measurements and phone number on blue sheet [employer] on [employer] in [place] also has the compression socks o Elevate, Exercise Daily and Avoid Standing for Long Periods of Time. o Elevate legs to the level of the heart and pump ankles as often as possible o Elevate leg(s) parallel to the floor when sitting. o Compression Pump: Use compression pump on left lower extremity for 60 minutes, twice daily. - You may pump over wraps o Compression Pump: Use compression pump on right lower extremity for 60 minutes, twice daily. - You may pump over wraps Wound Treatment Wound #3 - Lower Leg Wound Laterality: Left Primary Dressing: Hydrofera Blue Ready Transfer Foam, 4x5 (in/in) 1 x Per Week/30 Days Discharge Instructions: Apply Hydrofera Blue Ready to wound bed as directed Secondary Dressing: ABD Pad 5x9 (in/in) 1 x Per Week/30 Days Discharge  Instructions: Cover with ABD pad Compression Wrap: Profore Lite LF 3 Multilayer Compression Bandaging System 1 x Per Week/30 Days Discharge Instructions: Apply 3 multi-layer wrap as prescribed. Electronic Signature(s) Signed: 09/24/2021 3:10:19 PM By: Carlene Coria RN Signed: 09/25/2021 4:48:19 PM By: Worthy Keeler PA-C Entered By: Carlene Coria on 09/24/2021 15:10:19 [name] ([id number]) -------------------------------------------------------------------------------- SuperBill Details Patient Name: [name] Date of Service: 09/24/2021 Medical Record Number: [id number] Patient Account Number: [id number] Date of Birth/Sex: [birth year] (76 y.o. 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

## 2021-09-26 ENCOUNTER — Other Ambulatory Visit: Payer: Self-pay

## 2021-09-26 ENCOUNTER — Ambulatory Visit
Admission: RE | Admit: 2021-09-26 | Discharge: 2021-09-26 | Disposition: A | Discharge status: Home / Self Care | Payer: Medicare Other | Source: Ambulatory Visit | Attending: Hospice and Palliative Medicine | Admitting: Hospice and Palliative Medicine

## 2021-09-26 DIAGNOSIS — M79621 Pain in right upper arm: Secondary | ICD-10-CM | POA: Diagnosis present

## 2021-09-26 DIAGNOSIS — C50911 Malignant neoplasm of unspecified site of right female breast: Secondary | ICD-10-CM

## 2021-09-26 DIAGNOSIS — Z853 Personal history of malignant neoplasm of breast: Secondary | ICD-10-CM | POA: Diagnosis not present

## 2021-09-26 DIAGNOSIS — N644 Mastodynia: Secondary | ICD-10-CM | POA: Insufficient documentation

## 2021-10-01 ENCOUNTER — Encounter: Payer: Medicare Other | Admitting: Physician Assistant

## 2021-10-01 ENCOUNTER — Other Ambulatory Visit: Payer: Self-pay

## 2021-10-01 DIAGNOSIS — I890 Lymphedema, not elsewhere classified: Secondary | ICD-10-CM | POA: Diagnosis not present

## 2021-10-01 NOTE — Progress Notes (Addendum)
[name], [name] ([id number]) Visit Report for 10/01/2021 Chief Complaint Document Details Patient Name: [name] Date of Service: 10/01/2021 2:45 PM Medical Record Number: [id number] Patient Account Number: [id number] Date of Birth/Sex: Jun 21, 1946 (76 y.o. F) Treating RN: Carlene Coria Primary Care Provider: [name] Other Clinician: Referring Provider: [name] Treating Provider/Extender: Skipper Cliche in Treatment: 36 Information Obtained from: Patient Chief Complaint Left LE Ulcer Electronic Signature(s) Signed: 10/01/2021 3:14:45 PM By: Worthy Keeler PA-C Entered By: Worthy Keeler on 10/01/2021 15:14:45 [name] ([id number]) -------------------------------------------------------------------------------- HPI Details Patient Name: [name] Date of Service: 10/01/2021 2:45 PM Medical Record Number: [id number] Patient Account Number: [id number] Date of Birth/Sex: 1945/12/25 (76 y.o. F) Treating RN: Carlene Coria Primary Care Provider: [name] Other Clinician: Referring Provider: [name] Treating Provider/Extender: Skipper Cliche in Treatment: 36 History of Present Illness HPI Description: 03/15/2020 upon evaluation today patient presents today for initial evaluation here in our clinic concerning a wound that is on the left posterior lower extremity. Unfortunately this has been giving the patient some discomfort at this point which she notes has been affected her pretty much daily. The patient does have a history of venous insufficiency, hypertension, congestive heart failure, and a history of breast cancer. Fortunately there does not appear to be evidence of active infection at this time which is great news. No fevers, chills, nausea, vomiting, or diarrhea. Wound is not extremely large but does have some slough covering the surface of the wound. This is going require sharp debridement today. 03/15/2020 upon evaluation today patient  appears to be doing fairly well in regard to her wound. She does have some slough noted on the surface of the wound currently but I do believe the Iodoflex has been beneficial over the past week. There is no signs of active infection at this time which is great news and overall very pleased with the progress. No fevers, chills, nausea, vomiting, or diarrhea. 04/02/2020 upon evaluation today patient appears to be doing a little better in regard to her wound size wise this is not tremendously smaller she does have some slough buildup on the surface of the wound. With that being said this is going require some sharp debridement today to clear away some of the necrotic debris. Fortunately there is no evidence of active infection at this time. No fevers, chills, nausea, vomiting, or diarrhea. 04/10/2020 on evaluation today patient appears to be doing well with regard to her ulcer which is measuring somewhat smaller today. She actually has more granulation tissue noted which is also good news is no need for sharp debridement today. Fortunately there is no evidence of infection either which is also excellent. No fevers, chills, nausea, vomiting, or diarrhea. 04/26/2020 on evaluation today patient's wound actually is showing signs of good improvement which is great news. There does not appear to be any evidence of active infection. I do believe she is tolerating the collagen at this point. 05/03/2020 upon evaluation today patient's wound actually showed signs of good granulation at this time there does not appear to be any evidence of active infection which is great news and overall very pleased with where things stand. With that being said I do believe that the patient is can require some sharp debridement today but fortunately nothing too significant. 05/11/20 on evaluation today patient appears to be doing well in regard to her leg ulcer. She's been tolerating the dressing changes without complication.  Fortunately there is no signs of active infection at this time.  Overall I'm very pleased with where things stand. 05/18/2020 upon evaluation today patient's wound is showing signs of improvement is very dry and I think the collagen for the most part just dried to the wound bed. I am going to clean this away with sharp debridement in order to get this under control in my opinion. 05/25/2020 upon evaluation today patient actually appears to be doing well in regard to her leg ulcer. In fact upon inspection it appears she could potentially be completely healed but that is not guaranteed based on what we are seeing. There does not appear to be any signs of active infection which is great news. 05/31/2020 on evaluation today patient appears to be doing well with regard to her wounds. She in fact appears to be almost completely healed on the left leg there is just a very small opening remaining point 1 06/08/2020 upon evaluation today patient appears to be doing excellent in regard to her leg ulcer on the left in fact it appears to be that she is completely healed as of today. I see no signs of active infection at this time which is great news. No fevers, chills, nausea, vomiting, or diarrhea. READMISSION 01/16/2021 Patient that we have had in this clinic with a wound on the left posterior calf in the summer 2021 extending into October. She was felt to have chronic venous insufficiency. Per the patient's description she was discharged with what sounds like external compression stockings although she could not afford the "$75 per leg". She therefore did not get anything. Apparently her wound that she has currently is been in existence since January. She has been followed at vein and vascular with Unna boots and I think calcium alginate. She had ABIs done on 06/25/2020 that showed an noncompressible ABI on the right leg and the left leg but triphasic waveforms with good great toe pressures and a TBI of 0.90 on the  right and 0.91 on the left Surprisingly the patient also had venous reflux studies that were really unremarkable. This included no evidence of DVTs or SVTs but there was no evidence of deep venous insufficiency or superficial venous insufficiency in the greater saphenous or short saphenous veins bilaterally. I would wonder about more central venous issues or perhaps this is all lymphedema 01/25/2021 upon evaluation today patient appears to be doing decently well and guard to her wound. The good news is the Iodoflex that is job she saw Dr. Dellia Nims last week for readmission and to be honest I think that this has done extremely well over that week. I do believe that she can tolerate the sharp debridement today which will be good. I think that cleaning off the wound will likely allow [place] to be able to use a different type of dressing I do not think the Iodoflex will even be necessary based on how clean the wound looks. Fortunately there is no sign of active infection at this time which is great news. No fevers, chills, nausea, vomiting, or diarrhea. 02/12/2021 upon evaluation today patient appears to be doing well with regard to her leg ulcer. We did switch to alginate when she came for nurse visit I think is doing much better for her. Fortunately there does not appear to be any signs of active infection at this time which is great news. No fevers, chills, nausea, vomiting, or diarrhea. [name], [name] ([id number]) 02/18/2021 upon evaluation today patient's wound is actually showing signs of good improvement. I am very pleased with where things stand  currently. I do not see any signs of active infection which is great news and overall I feel like she is making good progress. The patient likewise is happy that things are doing so well and that this is measuring somewhat smaller. 02/25/2021 upon evaluation today patient actually appears to be doing quite well in regard to her wounds. She has been tolerating the  dressing changes without complication. Fortunately there does not appear to be any signs of active infection which is great news and overall I am extremely pleased with where things stand. No fevers, chills, nausea, vomiting, or diarrhea. She does have a lot of edema I really do think she needs compression socks to be worn in order to prevent things from causing additional issues for her currently. Especially on the right leg she should be wearing compression socks all the time to be honest. 03/04/2021 upon evaluation today patient's leg ulcer actually appears to be doing pretty well which is great news. There does not appear to be any significant change but overall the appearance is better even though the size is not necessarily reflecting a great improvement. Fortunately there does not appear to be any signs of active infection. No fevers, chills, nausea, vomiting, or diarrhea. 03/12/2021 upon evaluation today patient appears to be doing well with regard to her wounds. She has been tolerating the dressing changes without complication. The good news is that the wound on her left lateral leg is doing much better and is showing signs of good improvement. Overall her swelling is controlled well as well. She does need some compression socks she plans to go Jefferey socks next week. 03/18/2021 on evaluation today patient's wound actually is showing signs of good improvement. She does have a little bit of slough this can require some sharp debridement today. 03/25/2021 upon evaluation today patient appears to be doing well with regard to her wound on the left lateral leg. She has been tolerating the dressing changes without complication. Fortunately there does not appear to be any signs of active infection at this time. No fever chills no 04/01/2021 upon evaluation today patient's wound is showing signs of improvement and overall very pleased with where things stand at this point. There is no evidence of active  infection which is great news as well and in general I think that she is making great progress. 04/09/2021 upon evaluation today patient appears to be doing well with regard to her ankle ulcer. She is making good progress currently which is great news. There does not appear to be any signs of infection also excellent news. In general I am extremely pleased with where things stand today. No fevers, chills, nausea, vomiting, or diarrhea. 04/23/2021 upon inspection today patient appears to be doing quite well with regard to her leg ulcer. She has been tolerating the dressing changes without complication. She is can require some sharp debridement today but overall seems to be doing excellent. 05/06/2021 upon evaluation today patient unfortunately appears to be doing poorly overall in regard to her swelling. She is actually significantly more swollen than last week despite the fact that her wound is doing better this is not a good trend. I think that she probably needs to see her cardiologist ASAP and I discussed that with her today. This includes the fact that honestly I think she probably is getting need an increase in her diuretic or something to try to clear away some of the excess fluid that she is experiencing at the moment. She is  not been doing her pumps even twice a day but again right now more concerned that if she were to do the pump she would fluid overload her lungs causing other issues as far as her breathing is concerned. Obviously I think she needs to contact them and move up the appointment for the 12th of something sooner 2 weeks is a bit too long to wait with how swollen she has. 05/14/2021 upon evaluation today patient's wound actually showing signs of being a little bit smaller compared to previous. She has been making good progress however. Fortunately there does not appear to be any evidence of active infection which is great. Overall I am extremely pleased in that regard. Nonetheless I am  still concerned that she is having quite a bit of issue as far as the swelling is concerned she has been placed on fluid restriction as well as an increase in the furosemide by her doctor. We will see how things progress. 05/21/2021 upon evaluation today patient appears to be doing well with regard to her wound in general. She has been tolerating the dressing changes without complication. With that being said she has been using silver cell for a while and while this was showing signs of improvement it is now gotten to the point where this has slowed down quite significantly. I do believe that switching to a different dressing may be beneficial for her today. 05/28/2021 upon evaluation today patient actually appears to be doing quite well in regard to her wound. In fact there is really not any need for sharp debridement today based on what I see. The Hydrofera Blue is doing great and overall I think that she is managing quite nicely with the compression wrapping. Her edema is still down quite a bit with the wrapping in place. 06/11/2021 upon evaluation today patient appears to be doing well with regard to her leg ulcer. She has been tolerating the dressing changes without complication. Fortunately there does not appear to be any signs of active infection at this time which is great news. No fevers, chills, nausea, vomiting, or diarrhea. She is going require some sharp debridement today. 10/11; left leg ulcer much better looking and with improved surface area. She is using Hydrofera Blue under 3 layer compression. She does not have any stocking on the right leg which in itself has very significant nonpitting edema 06/25/2021 upon evaluation today patient appears to be doing well with regard to the wound on her left leg. She has been tolerating the dressing changes without complication and overall I am extremely pleased with where things stand today. I do not see any evidence of infection which is great  news. 07/02/2021 upon evaluation today patient's wound actually showing signs of excellent improvement and actually very pleased with where we stand today and the overall appearance. Fortunately there does not appear to be any signs of active infection. No fevers, chills, nausea, vomiting, or diarrhea. 07/09/2021 upon evaluation today patient appears to be doing better in regard to her wound this is measuring smaller and she is headed in the appropriate direction based on what I am seeing currently. I do not see any evidence of active infection systemically which is great news. 07/16/2021 upon evaluation today patient appears to be doing pretty well currently in regard to her leg ulcer. This is measuring smaller and looking much better this is great news. Fortunately there does not appear to be any evidence of active infection systemically which is great news as well.  07/23/2021 upon evaluation today patient's wound is actually showing signs of good improvement this is definitely measuring smaller. I do not see any signs of infection which is great news and overall very pleased in that regard. Overall I think that she is doing well with the Jamara Vary, [place] ([id number]) [place]. 07/30/2020 upon evaluation today patient appears to be doing well in regard to her wound. She has been tolerating the dressing changes without complication think the Hydrofera Blue at this point is actually causing her to dry out a lot as far as the wound bed is concerned. Subsequently I think that we need to try to see what we can do to improve this. Fortunately there does not appear to be any evidence of active infection at this time which is great news. 08/06/2021 upon evaluation today patient appears to be doing well with regards to her wound. Is not measuring significantly smaller initial inspection but upon closer inspection she has a lot of new skin growth across the central portion of the wound. I think will  get very close to complete resolution. 08/13/2021 upon evaluation today patient appears to be doing well with regard to her wound. In fact this appears to be I believe healed although there is still some question as to whether it is completely so. I am concerned about the fact that to be honest she still has some evidence of dry skin that could be just trapping something underneath I still want to debride anything as I am afraid of causing some damage to the good skin for that reason I Georgina Peer probably monitor for 1 more week before completely closing everything out. 08/20/2021 upon evaluation today patient actually appears to be doing excellent. In fact she appears to be completely healed. This was the case last week as well we just wanted to make sure nothing reopened since she does not really have an ability to put on compression socks this is good to be something we need to make sure was well-healed. Nonetheless I think we have achieved that goal as of today. 12/27; this is a patient we discharged 2 weeks ago with wounds on her left lower leg laterally. She was supposed to get stockings and really did not get them. She developed blistering and reopening probably sometime late last week. She has 3 areas in the same area as previously. The mid calf dimensions on the left went from 39 to 51 cm today. She also has very significant edema on the right leg but as far as I am aware has not had wounds in this area 1/4; patient presents for follow-up. She has tolerated the compression wrap well. She has no issues or complaints today. 09/17/2021 upon evaluation today patient appears to be doing well with regard to her wounds. She has been tolerating the dressing changes without complication. Fortunately there does not appear to be any signs of active infection at this time. No fevers, chills, nausea, vomiting, or diarrhea. 10/01/2021 upon evaluation today patient appears to be doing well with regard to her  wound this is actually measuring better and looking smaller and I am very pleased in that regard. Fortunately I do not see any evidence of active infection locally nor systemically at this point. No fevers, chills, nausea, vomiting, or diarrhea. Electronic Signature(s) Signed: 10/01/2021 5:47:56 PM By: Worthy Keeler PA-C Entered By: Worthy Keeler on 10/01/2021 17:47:55 [name] ([id number]) -------------------------------------------------------------------------------- Physical Exam Details Patient Name: [name] Date of Service: 10/01/2021 2:45 PM Medical  Record Number: [id number] Patient Account Number: [id number] Date of Birth/Sex: 18-Apr-1946 (76 y.o. F) Treating RN: Carlene Coria Primary Care Provider: [name] Other Clinician: Referring Provider: [name] Treating Provider/Extender: Skipper Cliche in Treatment: 36 Constitutional Obese and well-hydrated in no acute distress. Respiratory normal breathing without difficulty. Psychiatric this patient is able to make decisions and demonstrates good insight into disease process. Alert and Oriented x 3. pleasant and cooperative. Notes Upon inspection patient's wound bed actually showed signs of good granulation and epithelization at this time. Overall I feel like she is actually making excellent progress and I think that she is slowly progressing towards getting this healed. The biggest issue is that unfortunately she does not have anybody to help her and is not able to put compression socks on at home therefore even once we get her healed I fear that we will just get a continue to have this ongoing cycle back-and-forth unfortunately. Electronic Signature(s) Signed: 10/01/2021 5:48:24 PM By: Worthy Keeler PA-C Entered By: Worthy Keeler on 10/01/2021 17:48:24 [name] ([id number]) -------------------------------------------------------------------------------- Physician Orders Details Patient  Name: [name] Date of Service: 10/01/2021 2:45 PM Medical Record Number: [id number] Patient Account Number: [id number] Date of Birth/Sex: [birth year] (76 y.o. F) Treating RN: Carlene Coria Primary Care Provider: [name] Other Clinician: Referring Provider: [name] Treating Provider/Extender: Skipper Cliche in Treatment: 70 Verbal / Phone Orders: No Diagnosis Coding ICD-10 Coding Code Description I89.0 Lymphedema, not elsewhere classified I87.332 Chronic venous hypertension (idiopathic) with ulcer and inflammation of left lower extremity L97.828 Non-pressure chronic ulcer of other part of left lower leg with other specified severity Follow-up Appointments o Return Appointment in 1 week. Bathing/ Shower/ Hygiene o May shower with wound dressing protected with water repellent cover or cast protector. Edema Control - Lymphedema / Segmental Compressive Device / Other Left Lower Extremity o Optional: One layer of unna paste to top of compression wrap (to act as an anchor). o Patient to wear own compression stockings. Remove compression stockings every night before going to bed and put on every morning when getting up. - on RIGHT LEG call [facility] for compression sock 15-22 mm/Hg, measurements and phone number on blue sheet [employer] on [employer] in [place] also has the compression socks o Elevate, Exercise Daily and Avoid Standing for Long Periods of Time. o Elevate legs to the level of the heart and pump ankles as often as possible o Elevate leg(s) parallel to the floor when sitting. o Compression Pump: Use compression pump on left lower extremity for 60 minutes, twice daily. - You may pump over wraps o Compression Pump: Use compression pump on right lower extremity for 60 minutes, twice daily. - You may pump over wraps Wound Treatment Wound #3 - Lower Leg Wound Laterality: Left Primary Dressing: Hydrofera Blue Ready Transfer Foam,  4x5 (in/in) 1 x Per Week/30 Days Discharge Instructions: Apply Hydrofera Blue Ready to wound bed as directed Secondary Dressing: ABD Pad 5x9 (in/in) 1 x Per Week/30 Days Discharge Instructions: Cover with ABD pad Compression Wrap: Profore Lite LF 3 Multilayer Compression Bandaging System 1 x Per Week/30 Days Discharge Instructions: Apply 3 multi-layer wrap as prescribed. Electronic Signature(s) Signed: 10/01/2021 6:21:10 PM By: Worthy Keeler PA-C Signed: 10/02/2021 4:29:00 PM By: Carlene Coria RN Entered By: Carlene Coria on 10/01/2021 15:30:03 [name] ([id number]) -------------------------------------------------------------------------------- Problem List Details Patient Name: [name] Date of Service: 10/01/2021 2:45 PM Medical Record Number: [id number] Patient Account Number: [id number] Date of Birth/Sex: 07-16-1946 (75  y.o. F) Treating RN: Carlene Coria Primary Care Provider: [name] Other Clinician: Referring Provider: [name] Treating Provider/Extender: Skipper Cliche in Treatment: 36 Active Problems ICD-10 Encounter Code Description Active Date MDM Diagnosis I89.0 Lymphedema, not elsewhere classified 01/16/2021 No Yes I87.332 Chronic venous hypertension (idiopathic) with ulcer and inflammation of 01/16/2021 No Yes left lower extremity L97.828 Non-pressure chronic ulcer of other part of left lower leg with other 01/16/2021 No Yes specified severity Inactive Problems Resolved Problems Electronic Signature(s) Signed: 10/01/2021 3:14:40 PM By: Worthy Keeler PA-C Entered By: Worthy Keeler on 10/01/2021 15:14:39 [name] ([id number]) -------------------------------------------------------------------------------- Progress Note Details Patient Name: [name] Date of Service: 10/01/2021 2:45 PM Medical Record Number: [id number] Patient Account Number: [id number] Date of Birth/Sex: 05-08-1946 (76 y.o. F) Treating RN: Carlene Coria Primary Care Provider: [name] Other Clinician: Referring Provider: [name] Treating Provider/Extender: Skipper Cliche in Treatment: 36 Subjective Chief Complaint Information obtained from Patient Left LE Ulcer History of Present Illness (HPI) 03/15/2020 upon evaluation today patient presents today for initial evaluation here in our clinic concerning a wound that is on the left posterior lower extremity. Unfortunately this has been giving the patient some discomfort at this point which she notes has been affected her pretty much daily. The patient does have a history of venous insufficiency, hypertension, congestive heart failure, and a history of breast cancer. Fortunately there does not appear to be evidence of active infection at this time which is great news. No fevers, chills, nausea, vomiting, or diarrhea. Wound is not extremely large but does have some slough covering the surface of the wound. This is going require sharp debridement today. 03/15/2020 upon evaluation today patient appears to be doing fairly well in regard to her wound. She does have some slough noted on the surface of the wound currently but I do believe the Iodoflex has been beneficial over the past week. There is no signs of active infection at this time which is great news and overall very pleased with the progress. No fevers, chills, nausea, vomiting, or diarrhea. 04/02/2020 upon evaluation today patient appears to be doing a little better in regard to her wound size wise this is not tremendously smaller she does have some slough buildup on the surface of the wound. With that being said this is going require some sharp debridement today to clear away some of the necrotic debris. Fortunately there is no evidence of active infection at this time. No fevers, chills, nausea, vomiting, or diarrhea. 04/10/2020 on evaluation today patient appears to be doing well with regard to her ulcer which is measuring  somewhat smaller today. She actually has more granulation tissue noted which is also good news is no need for sharp debridement today. Fortunately there is no evidence of infection either which is also excellent. No fevers, chills, nausea, vomiting, or diarrhea. 04/26/2020 on evaluation today patient's wound actually is showing signs of good improvement which is great news. There does not appear to be any evidence of active infection. I do believe she is tolerating the collagen at this point. 05/03/2020 upon evaluation today patient's wound actually showed signs of good granulation at this time there does not appear to be any evidence of active infection which is great news and overall very pleased with where things stand. With that being said I do believe that the patient is can require some sharp debridement today but fortunately nothing too significant. 05/11/20 on evaluation today patient appears to be doing well in regard  to her leg ulcer. She's been tolerating the dressing changes without complication. Fortunately there is no signs of active infection at this time. Overall I'm very pleased with where things stand. 05/18/2020 upon evaluation today patient's wound is showing signs of improvement is very dry and I think the collagen for the most part just dried to the wound bed. I am going to clean this away with sharp debridement in order to get this under control in my opinion. 05/25/2020 upon evaluation today patient actually appears to be doing well in regard to her leg ulcer. In fact upon inspection it appears she could potentially be completely healed but that is not guaranteed based on what we are seeing. There does not appear to be any signs of active infection which is great news. 05/31/2020 on evaluation today patient appears to be doing well with regard to her wounds. She in fact appears to be almost completely healed on the left leg there is just a very small opening remaining point  1 06/08/2020 upon evaluation today patient appears to be doing excellent in regard to her leg ulcer on the left in fact it appears to be that she is completely healed as of today. I see no signs of active infection at this time which is great news. No fevers, chills, nausea, vomiting, or diarrhea. READMISSION 01/16/2021 Patient that we have had in this clinic with a wound on the left posterior calf in the summer 2021 extending into October. She was felt to have chronic venous insufficiency. Per the patient's description she was discharged with what sounds like external compression stockings although she could not afford the "$75 per leg". She therefore did not get anything. Apparently her wound that she has currently is been in existence since January. She has been followed at vein and vascular with Unna boots and I think calcium alginate. She had ABIs done on 06/25/2020 that showed an noncompressible ABI on the right leg and the left leg but triphasic waveforms with good great toe pressures and a TBI of 0.90 on the right and 0.91 on the left Surprisingly the patient also had venous reflux studies that were really unremarkable. This included no evidence of DVTs or SVTs but there was no evidence of deep venous insufficiency or superficial venous insufficiency in the greater saphenous or short saphenous veins bilaterally. I would wonder about more central venous issues or perhaps this is all lymphedema 01/25/2021 upon evaluation today patient appears to be doing decently well and guard to her wound. The good news is the Iodoflex that is job she saw Dr. Dellia Nims last week for readmission and to be honest I think that this has done extremely well over that week. I do believe that she can tolerate the sharp debridement today which will be good. I think that cleaning off the wound will likely allow [place] to be able to use a different type of dressing I do not think the Iodoflex will even be necessary based on how  clean the wound looks. Fortunately there is no sign of active infection at this time which is great news. No fevers, chills, nausea, vomiting, or diarrhea. [name], [name] ([id number]) 02/12/2021 upon evaluation today patient appears to be doing well with regard to her leg ulcer. We did switch to alginate when she came for nurse visit I think is doing much better for her. Fortunately there does not appear to be any signs of active infection at this time which is great news. No fevers, chills, nausea,  vomiting, or diarrhea. 02/18/2021 upon evaluation today patient's wound is actually showing signs of good improvement. I am very pleased with where things stand currently. I do not see any signs of active infection which is great news and overall I feel like she is making good progress. The patient likewise is happy that things are doing so well and that this is measuring somewhat smaller. 02/25/2021 upon evaluation today patient actually appears to be doing quite well in regard to her wounds. She has been tolerating the dressing changes without complication. Fortunately there does not appear to be any signs of active infection which is great news and overall I am extremely pleased with where things stand. No fevers, chills, nausea, vomiting, or diarrhea. She does have a lot of edema I really do think she needs compression socks to be worn in order to prevent things from causing additional issues for her currently. Especially on the right leg she should be wearing compression socks all the time to be honest. 03/04/2021 upon evaluation today patient's leg ulcer actually appears to be doing pretty well which is great news. There does not appear to be any significant change but overall the appearance is better even though the size is not necessarily reflecting a great improvement. Fortunately there does not appear to be any signs of active infection. No fevers, chills, nausea, vomiting, or diarrhea. 03/12/2021  upon evaluation today patient appears to be doing well with regard to her wounds. She has been tolerating the dressing changes without complication. The good news is that the wound on her left lateral leg is doing much better and is showing signs of good improvement. Overall her swelling is controlled well as well. She does need some compression socks she plans to go Jefferey socks next week. 03/18/2021 on evaluation today patient's wound actually is showing signs of good improvement. She does have a little bit of slough this can require some sharp debridement today. 03/25/2021 upon evaluation today patient appears to be doing well with regard to her wound on the left lateral leg. She has been tolerating the dressing changes without complication. Fortunately there does not appear to be any signs of active infection at this time. No fever chills no 04/01/2021 upon evaluation today patient's wound is showing signs of improvement and overall very pleased with where things stand at this point. There is no evidence of active infection which is great news as well and in general I think that she is making great progress. 04/09/2021 upon evaluation today patient appears to be doing well with regard to her ankle ulcer. She is making good progress currently which is great news. There does not appear to be any signs of infection also excellent news. In general I am extremely pleased with where things stand today. No fevers, chills, nausea, vomiting, or diarrhea. 04/23/2021 upon inspection today patient appears to be doing quite well with regard to her leg ulcer. She has been tolerating the dressing changes without complication. She is can require some sharp debridement today but overall seems to be doing excellent. 05/06/2021 upon evaluation today patient unfortunately appears to be doing poorly overall in regard to her swelling. She is actually significantly more swollen than last week despite the fact that her wound  is doing better this is not a good trend. I think that she probably needs to see her cardiologist ASAP and I discussed that with her today. This includes the fact that honestly I think she probably is getting need an increase  in her diuretic or something to try to clear away some of the excess fluid that she is experiencing at the moment. She is not been doing her pumps even twice a day but again right now more concerned that if she were to do the pump she would fluid overload her lungs causing other issues as far as her breathing is concerned. Obviously I think she needs to contact them and move up the appointment for the 12th of something sooner 2 weeks is a bit too long to wait with how swollen she has. 05/14/2021 upon evaluation today patient's wound actually showing signs of being a little bit smaller compared to previous. She has been making good progress however. Fortunately there does not appear to be any evidence of active infection which is great. Overall I am extremely pleased in that regard. Nonetheless I am still concerned that she is having quite a bit of issue as far as the swelling is concerned she has been placed on fluid restriction as well as an increase in the furosemide by her doctor. We will see how things progress. 05/21/2021 upon evaluation today patient appears to be doing well with regard to her wound in general. She has been tolerating the dressing changes without complication. With that being said she has been using silver cell for a while and while this was showing signs of improvement it is now gotten to the point where this has slowed down quite significantly. I do believe that switching to a different dressing may be beneficial for her today. 05/28/2021 upon evaluation today patient actually appears to be doing quite well in regard to her wound. In fact there is really not any need for sharp debridement today based on what I see. The Hydrofera Blue is doing great and  overall I think that she is managing quite nicely with the compression wrapping. Her edema is still down quite a bit with the wrapping in place. 06/11/2021 upon evaluation today patient appears to be doing well with regard to her leg ulcer. She has been tolerating the dressing changes without complication. Fortunately there does not appear to be any signs of active infection at this time which is great news. No fevers, chills, nausea, vomiting, or diarrhea. She is going require some sharp debridement today. 10/11; left leg ulcer much better looking and with improved surface area. She is using Hydrofera Blue under 3 layer compression. She does not have any stocking on the right leg which in itself has very significant nonpitting edema 06/25/2021 upon evaluation today patient appears to be doing well with regard to the wound on her left leg. She has been tolerating the dressing changes without complication and overall I am extremely pleased with where things stand today. I do not see any evidence of infection which is great news. 07/02/2021 upon evaluation today patient's wound actually showing signs of excellent improvement and actually very pleased with where we stand today and the overall appearance. Fortunately there does not appear to be any signs of active infection. No fevers, chills, nausea, vomiting, or diarrhea. 07/09/2021 upon evaluation today patient appears to be doing better in regard to her wound this is measuring smaller and she is headed in the appropriate direction based on what I am seeing currently. I do not see any evidence of active infection systemically which is great news. 07/16/2021 upon evaluation today patient appears to be doing pretty well currently in regard to her leg ulcer. This is measuring smaller and looking much better  this is great news. Fortunately there does not appear to be any evidence of active infection systemically which is great news [name], [name]  ([id number]) as well. 07/23/2021 upon evaluation today patient's wound is actually showing signs of good improvement this is definitely measuring smaller. I do not see any signs of infection which is great news and overall very pleased in that regard. Overall I think that she is doing well with the [facility]. 07/30/2020 upon evaluation today patient appears to be doing well in regard to her wound. She has been tolerating the dressing changes without complication think the Hydrofera Blue at this point is actually causing her to dry out a lot as far as the wound bed is concerned. Subsequently I think that we need to try to see what we can do to improve this. Fortunately there does not appear to be any evidence of active infection at this time which is great news. 08/06/2021 upon evaluation today patient appears to be doing well with regards to her wound. Is not measuring significantly smaller initial inspection but upon closer inspection she has a lot of new skin growth across the central portion of the wound. I think will get very close to complete resolution. 08/13/2021 upon evaluation today patient appears to be doing well with regard to her wound. In fact this appears to be I believe healed although there is still some question as to whether it is completely so. I am concerned about the fact that to be honest she still has some evidence of dry skin that could be just trapping something underneath I still want to debride anything as I am afraid of causing some damage to the good skin for that reason I Georgina Peer probably monitor for 1 more week before completely closing everything out. 08/20/2021 upon evaluation today patient actually appears to be doing excellent. In fact she appears to be completely healed. This was the case last week as well we just wanted to make sure nothing reopened since she does not really have an ability to put on compression socks this is good to be something we need to  make sure was well-healed. Nonetheless I think we have achieved that goal as of today. 12/27; this is a patient we discharged 2 weeks ago with wounds on her left lower leg laterally. She was supposed to get stockings and really did not get them. She developed blistering and reopening probably sometime late last week. She has 3 areas in the same area as previously. The mid calf dimensions on the left went from 39 to 51 cm today. She also has very significant edema on the right leg but as far as I am aware has not had wounds in this area 1/4; patient presents for follow-up. She has tolerated the compression wrap well. She has no issues or complaints today. 09/17/2021 upon evaluation today patient appears to be doing well with regard to her wounds. She has been tolerating the dressing changes without complication. Fortunately there does not appear to be any signs of active infection at this time. No fevers, chills, nausea, vomiting, or diarrhea. 10/01/2021 upon evaluation today patient appears to be doing well with regard to her wound this is actually measuring better and looking smaller and I am very pleased in that regard. Fortunately I do not see any evidence of active infection locally nor systemically at this point. No fevers, chills, nausea, vomiting, or diarrhea. Objective Constitutional Obese and well-hydrated in no acute distress. Vitals Time Taken: 3:04 PM,  Height: 69 in, Weight: 244 lbs, BMI: 36, Temperature: 97.5 F, Pulse: 57 bpm, Respiratory Rate: 18 breaths/min, Blood Pressure: 157/76 mmHg. Respiratory normal breathing without difficulty. Psychiatric this patient is able to make decisions and demonstrates good insight into disease process. Alert and Oriented x 3. pleasant and cooperative. General Notes: Upon inspection patient's wound bed actually showed signs of good granulation and epithelization at this time. Overall I feel like she is actually making excellent progress and I  think that she is slowly progressing towards getting this healed. The biggest issue is that unfortunately she does not have anybody to help her and is not able to put compression socks on at home therefore even once we get her healed I fear that we will just get a continue to have this ongoing cycle back-and-forth unfortunately. Integumentary (Hair, Skin) Wound #3 status is Open. Original cause of wound was Gradually Appeared. The date acquired was: 08/15/2021. The wound has been in treatment 4 weeks. The wound is located on the Left Lower Leg. The wound measures 7cm length x 7cm width x 0.1cm depth; 38.485cm^2 area and 3.848cm^3 volume. There is Fat Layer (Subcutaneous Tissue) exposed. There is no tunneling or undermining noted. There is a medium amount of serosanguineous drainage noted. There is medium (34-66%) red, pink, hyper - granulation within the wound bed. There is a medium (34-66%) amount of necrotic tissue within the wound bed including Adherent Slough. 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 There was a Three Layer Compression Therapy Procedure by Carlene Coria, RN. Post procedure Diagnosis Wound #3: Same as Pre-Procedure Plan Follow-up Appointments: Return Appointment in 1 week. Bathing/ Shower/ Hygiene: May shower with wound dressing protected with water repellent cover or cast protector. Edema Control - Lymphedema / Segmental Compressive Device / Other: Optional: One layer of unna paste to top of compression wrap (to act as an anchor). Patient to wear own compression stockings. Remove compression stockings every night before going to  bed and put on every morning when getting up. - on RIGHT LEG call [facility] for compression sock 15-22 mm/Hg, measurements and phone number on blue sheet [employer] on [employer] in [place] also has the compression socks Elevate, Exercise Daily and Avoid Standing for Long Periods of Time. Elevate legs to the level of the heart and pump ankles as often as possible Elevate leg(s) parallel to the floor when sitting. Compression Pump: Use compression pump on left lower extremity for 60 minutes, twice daily. - You may pump over wraps Compression Pump: Use compression pump on right lower extremity for 60 minutes, twice daily. - You may pump over wraps WOUND #3: - Lower Leg Wound Laterality: Left Primary Dressing: Hydrofera Blue Ready Transfer Foam, 4x5 (in/in) 1 x Per Week/30 Days Discharge Instructions: Apply Hydrofera Blue Ready to wound bed as directed Secondary Dressing: ABD Pad 5x9 (in/in) 1 x Per Week/30 Days Discharge Instructions: Cover with ABD pad Compression Wrap: Profore Lite LF 3 Multilayer Compression Bandaging System 1 x Per Week/30 Days Discharge Instructions: Apply 3 multi-layer wrap as prescribed. 1. Would recommend currently that we going to continue with the wound care measures as before with regard to the dressing which is a Hydrofera Blue dressing at this  time. 2. We will continue with ABD pad to cover. 3. We will continue with 3 layer compression wrap. We will see patient back for reevaluation in 1 week here in the clinic. If anything worsens or changes patient will contact our office for additional recommendations. Electronic Signature(s) Signed: 10/01/2021 5:48:50 PM By: Worthy Keeler PA-C Entered By: Worthy Keeler on 10/01/2021 17:48:50 [name] ([id number]) -------------------------------------------------------------------------------- SuperBill Details Patient Name: [name] Date of Service: 10/01/2021 Medical Record Number:  [id number] Patient Account Number: [id number] Date of Birth/Sex: October 02, 1945 (76 y.o. F) Treating RN: Carlene Coria Primary Care Provider: [name] Other Clinician: Referring Provider: [name] Treating Provider/Extender: Skipper Cliche in Treatment: 36 Diagnosis Coding ICD-10 Codes Code Description I89.0 Lymphedema, not elsewhere classified I87.332 Chronic venous hypertension (idiopathic) with ulcer and inflammation of left lower extremity L97.828 Non-pressure chronic ulcer of other part of left lower leg with other specified severity Facility Procedures CPT4 Code: [id number] Description: (Facility Use Only) [id number] - [place] LWR LT LEG Modifier: Quantity: 1 Physician Procedures CPT4 Code Description: [id number] - WC PHYS LEVEL 4 - EST PT Modifier: Quantity: 1 CPT4 Code Description: ICD-10 Diagnosis Description I89.0 Lymphedema, not elsewhere classified I87.332 Chronic venous hypertension (idiopathic) with ulcer and inflammation of le L97.828 Non-pressure chronic ulcer of other part of left lower leg with  other spec Modifier: ft lower extremity ified severity Quantity: Electronic Signature(s) Signed: 10/01/2021 5:50:26 PM By: Worthy Keeler PA-C Entered By: Worthy Keeler on 10/01/2021 17:50:26

## 2021-10-02 NOTE — Progress Notes (Signed)
ALYLA, PIETILA ([id number]) Visit Report for 10/01/2021 Arrival Information Details Patient Name: [name], [name] Date of Service: 10/01/2021 2:45 PM Medical Record Number: [id number] Patient Account Number: [id number] Date of Birth/Sex: February 20, 1946 (76 y.o. F) Treating RN: Carlene Coria Primary Care Ghazal Pevey: [name] Other Clinician: Referring Jamespaul Secrist: [name] Treating Shahd Occhipinti/Extender: Skipper Cliche in Treatment: 36 Visit Information History Since Last Visit All ordered tests and consults were completed: No Patient Arrived: Wheel Chair Added or deleted any medications: No Arrival Time: 14:57 Any new allergies or adverse reactions: No Accompanied By: self Had a fall or experienced change in No Transfer Assistance: None activities of daily living that may affect Patient Identification Verified: Yes risk of falls: Secondary Verification Process Completed: Yes Signs or symptoms of abuse/neglect since last visito No Patient Requires Transmission-Based Precautions: No Hospitalized since last visit: No Patient Has Alerts: Yes Implantable device outside of the clinic excluding No Patient Alerts: NOT DIABETIC cellular tissue based products placed in the center since last visit: Has Dressing in Place as Prescribed: Yes Has Compression in Place as Prescribed: Yes Pain Present Now: No Electronic Signature(s) Signed: 10/02/2021 4:29:00 PM By: Carlene Coria RN Entered By: Carlene Coria on 10/01/2021 15:04:43 [name] ([id number]) -------------------------------------------------------------------------------- Clinic Level of Care Assessment Details Patient Name: [name] Date of Service: 10/01/2021 2:45 PM Medical Record Number: [id number] Patient Account Number: [id number] Date of Birth/Sex: 10/26/1945 (76 y.o. F) Treating RN: Carlene Coria Primary Care Treazure Nery: [name] Other Clinician: Referring Marsalis Beaulieu: [name] Treating Daryel Kenneth/Extender:  Skipper Cliche in Treatment: 36 Clinic Level of Care Assessment Items TOOL 4 Quantity Score []  - Use when only an EandM is performed on FOLLOW-UP visit 0 ASSESSMENTS - Nursing Assessment / Reassessment []  - Reassessment of Co-morbidities (includes updates in patient status) 0 []  - 0 Reassessment of Adherence to Treatment Plan ASSESSMENTS - Wound and Skin Assessment / Reassessment []  - Simple Wound Assessment / Reassessment - one wound 0 []  - 0 Complex Wound Assessment / Reassessment - multiple wounds []  - 0 Dermatologic / Skin Assessment (not related to wound area) ASSESSMENTS - Focused Assessment []  - Circumferential Edema Measurements - multi extremities 0 []  - 0 Nutritional Assessment / Counseling / Intervention []  - 0 Lower Extremity Assessment (monofilament, tuning fork, pulses) []  - 0 Peripheral Arterial Disease Assessment (using hand held doppler) ASSESSMENTS - Ostomy and/or Continence Assessment and Care []  - Incontinence Assessment and Management 0 []  - 0 Ostomy Care Assessment and Management (repouching, etc.) PROCESS - Coordination of Care []  - Simple Patient / Family Education for ongoing care 0 []  - 0 Complex (extensive) Patient / Family Education for ongoing care []  - 0 Staff obtains Programmer, systems, Records, Test Results / Process Orders []  - 0 Staff telephones HHA, Nursing Homes / Clarify orders / etc []  - 0 Routine Transfer to another Facility (non-emergent condition) []  - 0 Routine Hospital Admission (non-emergent condition) []  - 0 New Admissions / Biomedical engineer / Ordering NPWT, Apligraf, etc. []  - 0 Emergency Hospital Admission (emergent condition) []  - 0 Simple Discharge Coordination []  - 0 Complex (extensive) Discharge Coordination PROCESS - Special Needs []  - Pediatric / Minor Patient Management 0 []  - 0 Isolation Patient Management []  - 0 Hearing / Language / Visual special needs []  - 0 Assessment of Community assistance  (transportation, D/C planning, etc.) []  - 0 Additional assistance / Altered mentation []  - 0 Support Surface(s) Assessment (bed, cushion, seat, etc.) INTERVENTIONS - Wound Cleansing / Measurement Breden, Adriene ([id number]) []  - 0 Simple  Wound Cleansing - one wound []  - 0 Complex Wound Cleansing - multiple wounds []  - 0 Wound Imaging (photographs - any number of wounds) []  - 0 Wound Tracing (instead of photographs) []  - 0 Simple Wound Measurement - one wound []  - 0 Complex Wound Measurement - multiple wounds INTERVENTIONS - Wound Dressings []  - Small Wound Dressing one or multiple wounds 0 []  - 0 Medium Wound Dressing one or multiple wounds []  - 0 Large Wound Dressing one or multiple wounds []  - 0 Application of Medications - topical []  - 0 Application of Medications - injection INTERVENTIONS - Miscellaneous []  - External ear exam 0 []  - 0 Specimen Collection (cultures, biopsies, blood, body fluids, etc.) []  - 0 Specimen(s) / Culture(s) sent or taken to Lab for analysis []  - 0 Patient Transfer (multiple staff / Civil Service fast streamer / Similar devices) []  - 0 Simple Staple / Suture removal (25 or less) []  - 0 Complex Staple / Suture removal (26 or more) []  - 0 Hypo / Hyperglycemic Management (close monitor of Blood Glucose) []  - 0 Ankle / Brachial Index (ABI) - do not check if billed separately []  - 0 Vital Signs Has the patient been seen at the hospital within the last three years: Yes Total Score: 0 Level Of Care: ____ Electronic Signature(s) Signed: 10/02/2021 4:29:00 PM By: Carlene Coria RN Entered By: Carlene Coria on 10/01/2021 15:30:25 [name] ([id number]) -------------------------------------------------------------------------------- Compression Therapy Details Patient Name: [name] Date of Service: 10/01/2021 2:45 PM Medical Record Number: [id number] Patient Account Number: [id number] Date of Birth/Sex: 20-Feb-1946 (76 y.o. F) Treating RN: Carlene Coria Primary Care Murvin Gift: [name] Other Clinician: Referring Shareece Bultman: [name] Treating Landen Breeland/Extender: Skipper Cliche in Treatment: 36 Compression Therapy Performed for Wound Assessment: Wound #3 Left Lower Leg Performed By: Clinician Carlene Coria, RN Compression Type: Three Layer Post Procedure Diagnosis Same as Pre-procedure Electronic Signature(s) Signed: 10/02/2021 4:29:00 PM By: Carlene Coria RN Entered By: Carlene Coria on 10/01/2021 15:25:46 [name] ([id number]) -------------------------------------------------------------------------------- Encounter Discharge Information Details Patient Name: [name] Date of Service: 10/01/2021 2:45 PM Medical Record Number: [id number] Patient Account Number: [id number] Date of Birth/Sex: 11/14/1945 (76 y.o. F) Treating RN: Carlene Coria Primary Care Osiris Odriscoll: [name] Other Clinician: Referring Baylee Campus: [name] Treating Gaye Scorza/Extender: Skipper Cliche in Treatment: 36 Encounter Discharge Information Items Discharge Condition: Stable Ambulatory Status: Wheelchair Discharge Destination: Home Transportation: Private Auto Accompanied By: self Schedule Follow-up Appointment: Yes Clinical Summary of Care: Patient Declined Electronic Signature(s) Signed: 10/02/2021 4:29:00 PM By: Carlene Coria RN Entered By: Carlene Coria on 10/01/2021 15:31:51 [name] ([id number]) -------------------------------------------------------------------------------- Lower Extremity Assessment Details Patient Name: [name] Date of Service: 10/01/2021 2:45 PM Medical Record Number: [id number] Patient Account Number: [id number] Date of Birth/Sex: 05/15/1946 (76 y.o. F) Treating RN: Carlene Coria Primary Care Wesam Gearhart: [name] Other Clinician: Referring Cristiana Yochim: [name] Treating Amin Fornwalt/Extender: Skipper Cliche in Treatment: 36 Edema Assessment Assessed: [Left: No] [Right:  No] Edema: [Left: Ye] [Right: s] Calf Left: Right: Point of Measurement: 27 cm From Medial Instep 45 cm Ankle Left: Right: Point of Measurement: 10 cm From Medial Instep 26 cm Vascular Assessment Pulses: Dorsalis Pedis Palpable: [Left:Yes] Electronic Signature(s) Signed: 10/02/2021 4:29:00 PM By: Carlene Coria RN Entered By: Carlene Coria on 10/01/2021 15:12:36 [name] ([id number]) -------------------------------------------------------------------------------- Multi Wound Chart Details Patient Name: [name] Date of Service: 10/01/2021 2:45 PM Medical Record Number: [id number] Patient Account Number: [id number] Date of Birth/Sex: 16-Jun-1946 (75 y.o. F) Treating RN: Carlene Coria Primary Care Mayre Bury:  [name] Other Clinician: Referring Davius Goudeau: [name] Treating Mikiyah Glasner/Extender: Skipper Cliche in Treatment: 36 Vital Signs Height(in): 69 Pulse(bpm): 52 Weight(lbs): 244 Blood Pressure(mmHg): 157/76 Body Mass Index(BMI): 36 Temperature(F): 97.5 Respiratory Rate(breaths/min): 18 Photos: [N/A:N/A] Wound Location: Left Lower Leg N/A N/A Wounding Event: Gradually Appeared N/A N/A Primary Etiology: Lymphedema N/A N/A Comorbid History: Cataracts, Lymphedema, Sleep N/A N/A Apnea, Congestive Heart Failure, Hypertension, Osteoarthritis, Neuropathy, Received Chemotherapy, Received Radiation Date Acquired: 08/15/2021 N/A N/A Weeks of Treatment: 4 N/A N/A Wound Status: Open N/A N/A Wound Recurrence: No N/A N/A Measurements L x W x D (cm) 7x7x0.1 N/A N/A Area (cm) : 38.485 N/A N/A Volume (cm) : 3.848 N/A N/A % Reduction in Area: 35.90% N/A N/A % Reduction in Volume: 36.00% N/A N/A Classification: Full Thickness Without Exposed N/A N/A Support Structures Exudate Amount: Medium N/A N/A Exudate Type: Serosanguineous N/A N/A Exudate Color: red, brown N/A N/A Granulation Amount: Medium (34-66%) N/A N/A Granulation Quality: Red, Pink,  Hyper-granulation N/A N/A Necrotic Amount: Medium (34-66%) N/A N/A Exposed Structures: Fat Layer (Subcutaneous Tissue): N/A N/A Yes Fascia: No Tendon: No Muscle: No Joint: No Bone: No Epithelialization: None N/A N/A Treatment Notes Electronic Signature(s) Signed: 10/02/2021 4:29:00 PM By: Carlene Coria RN Entered By: Carlene Coria on 10/01/2021 15:25:20 JASMYNN, PFALZGRAF ([id number]) VANESS, JELINSKI ([id number]) -------------------------------------------------------------------------------- Multi-Disciplinary Care Plan Details Patient Name: [name] Date of Service: 10/01/2021 2:45 PM Medical Record Number: [id number] Patient Account Number: [id number] Date of Birth/Sex: 20-Dec-1945 (76 y.o. F) Treating RN: Carlene Coria Primary Care Nyzier Boivin: [name] Other Clinician: Referring Jovian Lembcke: [name] Treating Naviyah Schaffert/Extender: Skipper Cliche in Treatment: 60 Active Inactive Electronic Signature(s) Signed: 10/02/2021 4:29:00 PM By: Carlene Coria RN Entered By: Carlene Coria on 10/01/2021 15:24:44 [name] ([id number]) -------------------------------------------------------------------------------- Pain Assessment Details Patient Name: [name] Date of Service: 10/01/2021 2:45 PM Medical Record Number: [id number] Patient Account Number: [id number] Date of Birth/Sex: 1946/04/16 (76 y.o. F) Treating RN: Carlene Coria Primary Care Kesley Gaffey: [name] Other Clinician: Referring Dagmawi Venable: [name] Treating Demitrious Mccannon/Extender: Skipper Cliche in Treatment: 36 Active Problems Location of Pain Severity and Description of Pain Patient Has Paino No Site Locations Pain Management and Medication Current Pain Management: Electronic Signature(s) Signed: 10/02/2021 4:29:00 PM By: Carlene Coria RN Entered By: Carlene Coria on 10/01/2021 15:05:14 [name]  ([id number]) -------------------------------------------------------------------------------- Patient/Caregiver Education Details Patient Name: [name] Date of Service: 10/01/2021 2:45 PM Medical Record Number: [id number] Patient Account Number: [id number] Date of Birth/Gender: Jun 21, 1946 (75 y.o. F) Treating RN: Carlene Coria Primary Care Physician: [name] Other Clinician: Referring Physician: [name] Treating Physician/Extender: Skipper Cliche in Treatment: 11 Education Assessment Education Provided To: Patient Education Topics Provided Wound/Skin Impairment: Methods: Explain/Verbal Responses: State content correctly Electronic Signature(s) Signed: 10/02/2021 4:29:00 PM By: Carlene Coria RN Entered By: Carlene Coria on 10/01/2021 15:30:59 [name] ([id number]) -------------------------------------------------------------------------------- Wound Assessment Details Patient Name: [name] Date of Service: 10/01/2021 2:45 PM Medical Record Number: [id number] Patient Account Number: [id number] Date of Birth/Sex: Feb 22, 1946 (76 y.o. F) Treating RN: Carlene Coria Primary Care Nazariah Cadet: [name] Other Clinician: Referring Chera Slivka: [name] Treating Aleyah Balik/Extender: Skipper Cliche in Treatment: 36 Wound Status Wound Number: 3 Primary Lymphedema Etiology: Wound Location: Left Lower Leg Wound Open Wounding Event: Gradually Appeared Status: Date Acquired: 08/15/2021 Comorbid Cataracts, Lymphedema, Sleep Apnea, Congestive Heart Weeks Of Treatment: 4 History: Failure, Hypertension, Osteoarthritis, Neuropathy, Clustered Wound: No Received Chemotherapy, Received Radiation Photos Wound Measurements Length: (cm) 7 Width: (cm) 7 Depth: (cm) 0.1 Area: (cm) 38.485 Volume: (cm) 3.848 %  Reduction in Area: 35.9% % Reduction in Volume: 36% Epithelialization: None Tunneling: No Undermining: No Wound Description Classification: Full  Thickness Without Exposed Support Structu Exudate Amount: Medium Exudate Type: Serosanguineous Exudate Color: red, brown res Foul Odor After Cleansing: No Slough/Fibrino Yes Wound Bed Granulation Amount: Medium (34-66%) Exposed Structure Granulation Quality: Red, Pink, Hyper-granulation Fascia Exposed: No Necrotic Amount: Medium (34-66%) Fat Layer (Subcutaneous Tissue) Exposed: Yes Necrotic Quality: Adherent Slough Tendon Exposed: No Muscle Exposed: No Joint Exposed: No Bone Exposed: No Treatment Notes Wound #3 (Lower Leg) Wound Laterality: Left Cleanser Peri-Wound Care Topical Primary Dressing HAYAT, [place] ([id number]) Hydrofera Blue Ready Transfer Foam, 4x5 (in/in) Discharge Instruction: Apply Hydrofera Blue Ready to wound bed as directed Secondary Dressing ABD Pad 5x9 (in/in) Discharge Instruction: Cover with ABD pad Secured With Compression Wrap Profore Lite LF 3 Multilayer Compression Bandaging System Discharge Instruction: Apply 3 multi-layer wrap as prescribed. Compression Stockings Add-Ons Electronic Signature(s) Signed: 10/02/2021 4:29:00 PM By: Carlene Coria RN Entered By: Carlene Coria on 10/01/2021 15:11:23 [name] ([id number]) -------------------------------------------------------------------------------- Spelter Details Patient Name: [name] Date of Service: 10/01/2021 2:45 PM Medical Record Number: [id number] Patient Account Number: [id number] Date of Birth/Sex: [birth year] (75 y.o. F) Treating RN: Carlene Coria Primary Care Phylliss Strege: [name] Other Clinician: Referring Sameera Betton: [name] Treating Iza Preston/Extender: Skipper Cliche in Treatment: 36 Vital Signs Time Taken: 15:04 Temperature (F): 97.5 Height (in): 69 Pulse (bpm): 57 Weight (lbs): 244 Respiratory Rate (breaths/min): 18 Body Mass Index (BMI): 36 Blood Pressure (mmHg): 157/76 Reference Range: 80 - 120 mg / dl Electronic Signature(s) Signed: 10/02/2021  4:29:00 PM By: Carlene Coria RN Entered By: Carlene Coria on 10/01/2021 15:05:02

## 2021-10-04 ENCOUNTER — Other Ambulatory Visit: Payer: Self-pay | Admitting: Oncology

## 2021-10-08 ENCOUNTER — Other Ambulatory Visit: Payer: Self-pay

## 2021-10-08 ENCOUNTER — Encounter: Payer: Medicare Other | Admitting: Physician Assistant

## 2021-10-08 DIAGNOSIS — I890 Lymphedema, not elsewhere classified: Secondary | ICD-10-CM | POA: Diagnosis not present

## 2021-10-08 NOTE — Progress Notes (Addendum)
SHAKORA, NORDQUIST ([id number]) Visit Report for 10/08/2021 Chief Complaint Document Details Patient Name: [name], [name] Date of Service: 10/08/2021 2:45 PM Medical Record Number: [id number] Patient Account Number: [id number] Date of Birth/Sex: 04-02-1946 (76 y.o. F) Treating RN: Carlene Coria Primary Care Provider: [name] Other Clinician: Referring Provider: [name] Treating Provider/Extender: Skipper Cliche in Treatment: 37 Information Obtained from: Patient Chief Complaint Left LE Ulcer Electronic Signature(s) Signed: 10/08/2021 2:54:43 PM By: Worthy Keeler PA-C Entered By: Worthy Keeler on 10/08/2021 14:54:43 [name] ([id number]) -------------------------------------------------------------------------------- HPI Details Patient Name: [name] Date of Service: 10/08/2021 2:45 PM Medical Record Number: [id number] Patient Account Number: [id number] Date of Birth/Sex: December 08, 1945 (75 y.o. F) Treating RN: Carlene Coria Primary Care Provider: [name] Other Clinician: Referring Provider: [name] Treating Provider/Extender: Skipper Cliche in Treatment: 37 History of Present Illness HPI Description: 03/15/2020 upon evaluation today patient presents today for initial evaluation here in our clinic concerning a wound that is on the left posterior lower extremity. Unfortunately this has been giving the patient some discomfort at this point which she notes has been affected her pretty much daily. The patient does have a history of venous insufficiency, hypertension, congestive heart failure, and a history of breast cancer. Fortunately there does not appear to be evidence of active infection at this time which is great news. No fevers, chills, nausea, vomiting, or diarrhea. Wound is not extremely large but does have some slough covering the surface of the wound. This is going require sharp debridement today. 03/15/2020 upon evaluation today patient  appears to be doing fairly well in regard to her wound. She does have some slough noted on the surface of the wound currently but I do believe the Iodoflex has been beneficial over the past week. There is no signs of active infection at this time which is great news and overall very pleased with the progress. No fevers, chills, nausea, vomiting, or diarrhea. 04/02/2020 upon evaluation today patient appears to be doing a little better in regard to her wound size wise this is not tremendously smaller she does have some slough buildup on the surface of the wound. With that being said this is going require some sharp debridement today to clear away some of the necrotic debris. Fortunately there is no evidence of active infection at this time. No fevers, chills, nausea, vomiting, or diarrhea. 04/10/2020 on evaluation today patient appears to be doing well with regard to her ulcer which is measuring somewhat smaller today. She actually has more granulation tissue noted which is also good news is no need for sharp debridement today. Fortunately there is no evidence of infection either which is also excellent. No fevers, chills, nausea, vomiting, or diarrhea. 04/26/2020 on evaluation today patient's wound actually is showing signs of good improvement which is great news. There does not appear to be any evidence of active infection. I do believe she is tolerating the collagen at this point. 05/03/2020 upon evaluation today patient's wound actually showed signs of good granulation at this time there does not appear to be any evidence of active infection which is great news and overall very pleased with where things stand. With that being said I do believe that the patient is can require some sharp debridement today but fortunately nothing too significant. 05/11/20 on evaluation today patient appears to be doing well in regard to her leg ulcer. She's been tolerating the dressing changes without complication.  Fortunately there is no signs of active infection at this time.  Overall I'm very pleased with where things stand. 05/18/2020 upon evaluation today patient's wound is showing signs of improvement is very dry and I think the collagen for the most part just dried to the wound bed. I am going to clean this away with sharp debridement in order to get this under control in my opinion. 05/25/2020 upon evaluation today patient actually appears to be doing well in regard to her leg ulcer. In fact upon inspection it appears she could potentially be completely healed but that is not guaranteed based on what we are seeing. There does not appear to be any signs of active infection which is great news. 05/31/2020 on evaluation today patient appears to be doing well with regard to her wounds. She in fact appears to be almost completely healed on the left leg there is just a very small opening remaining point 1 06/08/2020 upon evaluation today patient appears to be doing excellent in regard to her leg ulcer on the left in fact it appears to be that she is completely healed as of today. I see no signs of active infection at this time which is great news. No fevers, chills, nausea, vomiting, or diarrhea. READMISSION 01/16/2021 Patient that we have had in this clinic with a wound on the left posterior calf in the summer 2021 extending into October. She was felt to have chronic venous insufficiency. Per the patient's description she was discharged with what sounds like external compression stockings although she could not afford the "$75 per leg". She therefore did not get anything. Apparently her wound that she has currently is been in existence since January. She has been followed at vein and vascular with Unna boots and I think calcium alginate. She had ABIs done on 06/25/2020 that showed an noncompressible ABI on the right leg and the left leg but triphasic waveforms with good great toe pressures and a TBI of 0.90 on the  right and 0.91 on the left Surprisingly the patient also had venous reflux studies that were really unremarkable. This included no evidence of DVTs or SVTs but there was no evidence of deep venous insufficiency or superficial venous insufficiency in the greater saphenous or short saphenous veins bilaterally. I would wonder about more central venous issues or perhaps this is all lymphedema 01/25/2021 upon evaluation today patient appears to be doing decently well and guard to her wound. The good news is the Iodoflex that is job she saw Dr. Dellia Nims last week for readmission and to be honest I think that this has done extremely well over that week. I do believe that she can tolerate the sharp debridement today which will be good. I think that cleaning off the wound will likely allow [place] to be able to use a different type of dressing I do not think the Iodoflex will even be necessary based on how clean the wound looks. Fortunately there is no sign of active infection at this time which is great news. No fevers, chills, nausea, vomiting, or diarrhea. 02/12/2021 upon evaluation today patient appears to be doing well with regard to her leg ulcer. We did switch to alginate when she came for nurse visit I think is doing much better for her. Fortunately there does not appear to be any signs of active infection at this time which is great news. No fevers, chills, nausea, vomiting, or diarrhea. [name], [name] ([id number]) 02/18/2021 upon evaluation today patient's wound is actually showing signs of good improvement. I am very pleased with where things stand  currently. I do not see any signs of active infection which is great news and overall I feel like she is making good progress. The patient likewise is happy that things are doing so well and that this is measuring somewhat smaller. 02/25/2021 upon evaluation today patient actually appears to be doing quite well in regard to her wounds. She has been tolerating the  dressing changes without complication. Fortunately there does not appear to be any signs of active infection which is great news and overall I am extremely pleased with where things stand. No fevers, chills, nausea, vomiting, or diarrhea. She does have a lot of edema I really do think she needs compression socks to be worn in order to prevent things from causing additional issues for her currently. Especially on the right leg she should be wearing compression socks all the time to be honest. 03/04/2021 upon evaluation today patient's leg ulcer actually appears to be doing pretty well which is great news. There does not appear to be any significant change but overall the appearance is better even though the size is not necessarily reflecting a great improvement. Fortunately there does not appear to be any signs of active infection. No fevers, chills, nausea, vomiting, or diarrhea. 03/12/2021 upon evaluation today patient appears to be doing well with regard to her wounds. She has been tolerating the dressing changes without complication. The good news is that the wound on her left lateral leg is doing much better and is showing signs of good improvement. Overall her swelling is controlled well as well. She does need some compression socks she plans to go Jefferey socks next week. 03/18/2021 on evaluation today patient's wound actually is showing signs of good improvement. She does have a little bit of slough this can require some sharp debridement today. 03/25/2021 upon evaluation today patient appears to be doing well with regard to her wound on the left lateral leg. She has been tolerating the dressing changes without complication. Fortunately there does not appear to be any signs of active infection at this time. No fever chills no 04/01/2021 upon evaluation today patient's wound is showing signs of improvement and overall very pleased with where things stand at this point. There is no evidence of active  infection which is great news as well and in general I think that she is making great progress. 04/09/2021 upon evaluation today patient appears to be doing well with regard to her ankle ulcer. She is making good progress currently which is great news. There does not appear to be any signs of infection also excellent news. In general I am extremely pleased with where things stand today. No fevers, chills, nausea, vomiting, or diarrhea. 04/23/2021 upon inspection today patient appears to be doing quite well with regard to her leg ulcer. She has been tolerating the dressing changes without complication. She is can require some sharp debridement today but overall seems to be doing excellent. 05/06/2021 upon evaluation today patient unfortunately appears to be doing poorly overall in regard to her swelling. She is actually significantly more swollen than last week despite the fact that her wound is doing better this is not a good trend. I think that she probably needs to see her cardiologist ASAP and I discussed that with her today. This includes the fact that honestly I think she probably is getting need an increase in her diuretic or something to try to clear away some of the excess fluid that she is experiencing at the moment. She is  not been doing her pumps even twice a day but again right now more concerned that if she were to do the pump she would fluid overload her lungs causing other issues as far as her breathing is concerned. Obviously I think she needs to contact them and move up the appointment for the 12th of something sooner 2 weeks is a bit too long to wait with how swollen she has. 05/14/2021 upon evaluation today patient's wound actually showing signs of being a little bit smaller compared to previous. She has been making good progress however. Fortunately there does not appear to be any evidence of active infection which is great. Overall I am extremely pleased in that regard. Nonetheless I am  still concerned that she is having quite a bit of issue as far as the swelling is concerned she has been placed on fluid restriction as well as an increase in the furosemide by her doctor. We will see how things progress. 05/21/2021 upon evaluation today patient appears to be doing well with regard to her wound in general. She has been tolerating the dressing changes without complication. With that being said she has been using silver cell for a while and while this was showing signs of improvement it is now gotten to the point where this has slowed down quite significantly. I do believe that switching to a different dressing may be beneficial for her today. 05/28/2021 upon evaluation today patient actually appears to be doing quite well in regard to her wound. In fact there is really not any need for sharp debridement today based on what I see. The Hydrofera Blue is doing great and overall I think that she is managing quite nicely with the compression wrapping. Her edema is still down quite a bit with the wrapping in place. 06/11/2021 upon evaluation today patient appears to be doing well with regard to her leg ulcer. She has been tolerating the dressing changes without complication. Fortunately there does not appear to be any signs of active infection at this time which is great news. No fevers, chills, nausea, vomiting, or diarrhea. She is going require some sharp debridement today. 10/11; left leg ulcer much better looking and with improved surface area. She is using Hydrofera Blue under 3 layer compression. She does not have any stocking on the right leg which in itself has very significant nonpitting edema 06/25/2021 upon evaluation today patient appears to be doing well with regard to the wound on her left leg. She has been tolerating the dressing changes without complication and overall I am extremely pleased with where things stand today. I do not see any evidence of infection which is great  news. 07/02/2021 upon evaluation today patient's wound actually showing signs of excellent improvement and actually very pleased with where we stand today and the overall appearance. Fortunately there does not appear to be any signs of active infection. No fevers, chills, nausea, vomiting, or diarrhea. 07/09/2021 upon evaluation today patient appears to be doing better in regard to her wound this is measuring smaller and she is headed in the appropriate direction based on what I am seeing currently. I do not see any evidence of active infection systemically which is great news. 07/16/2021 upon evaluation today patient appears to be doing pretty well currently in regard to her leg ulcer. This is measuring smaller and looking much better this is great news. Fortunately there does not appear to be any evidence of active infection systemically which is great news as well.  07/23/2021 upon evaluation today patient's wound is actually showing signs of good improvement this is definitely measuring smaller. I do not see any signs of infection which is great news and overall very pleased in that regard. Overall I think that she is doing well with the Jude Linck, [place] ([id number]) Peters. 07/30/2020 upon evaluation today patient appears to be doing well in regard to her wound. She has been tolerating the dressing changes without complication think the Hydrofera Blue at this point is actually causing her to dry out a lot as far as the wound bed is concerned. Subsequently I think that we need to try to see what we can do to improve this. Fortunately there does not appear to be any evidence of active infection at this time which is great news. 08/06/2021 upon evaluation today patient appears to be doing well with regards to her wound. Is not measuring significantly smaller initial inspection but upon closer inspection she has a lot of new skin growth across the central portion of the wound. I think will  get very close to complete resolution. 08/13/2021 upon evaluation today patient appears to be doing well with regard to her wound. In fact this appears to be I believe healed although there is still some question as to whether it is completely so. I am concerned about the fact that to be honest she still has some evidence of dry skin that could be just trapping something underneath I still want to debride anything as I am afraid of causing some damage to the good skin for that reason I Georgina Peer probably monitor for 1 more week before completely closing everything out. 08/20/2021 upon evaluation today patient actually appears to be doing excellent. In fact she appears to be completely healed. This was the case last week as well we just wanted to make sure nothing reopened since she does not really have an ability to put on compression socks this is good to be something we need to make sure was well-healed. Nonetheless I think we have achieved that goal as of today. 12/27; this is a patient we discharged 2 weeks ago with wounds on her left lower leg laterally. She was supposed to get stockings and really did not get them. She developed blistering and reopening probably sometime late last week. She has 3 areas in the same area as previously. The mid calf dimensions on the left went from 39 to 51 cm today. She also has very significant edema on the right leg but as far as I am aware has not had wounds in this area 1/4; patient presents for follow-up. She has tolerated the compression wrap well. She has no issues or complaints today. 09/17/2021 upon evaluation today patient appears to be doing well with regard to her wounds. She has been tolerating the dressing changes without complication. Fortunately there does not appear to be any signs of active infection at this time. No fevers, chills, nausea, vomiting, or diarrhea. 10/01/2021 upon evaluation today patient appears to be doing well with regard to her  wound this is actually measuring better and looking smaller and I am very pleased in that regard. Fortunately I do not see any evidence of active infection locally nor systemically at this point. No fevers, chills, nausea, vomiting, or diarrhea. 10/08/2021 upon evaluation patient's wounds are actually showing some signs of improvement measuring a little bit smaller and looking a little bit better. Overall I do not think there is any need for sharp debridement today which  is good news. No fevers, chills, nausea, vomiting, or diarrhea. Electronic Signature(s) Signed: 10/08/2021 4:38:22 PM By: Worthy Keeler PA-C Entered By: Worthy Keeler on 10/08/2021 16:38:22 [name], [name] ([id number]) -------------------------------------------------------------------------------- Physical Exam Details Patient Name: [name] Date of Service: 10/08/2021 2:45 PM Medical Record Number: [id number] Patient Account Number: [id number] Date of Birth/Sex: 1946/07/28 (76 y.o. F) Treating RN: Carlene Coria Primary Care Provider: [name] Other Clinician: Referring Provider: [name] Treating Provider/Extender: Skipper Cliche in Treatment: 46 Constitutional Well-nourished and well-hydrated in no acute distress. Respiratory normal breathing without difficulty. Psychiatric this patient is able to make decisions and demonstrates good insight into disease process. Alert and Oriented x 3. pleasant and cooperative. Notes Upon inspection patient's wound bed showed evidence of good granulation and epithelization at this point. Overall I think that we are headed in the right direction and I think that she is doing much better to she tells me it itches a bit there is really no significant pain both of which are good signs. Electronic Signature(s) Signed: 10/08/2021 4:38:44 PM By: Worthy Keeler PA-C Entered By: Worthy Keeler on 10/08/2021 16:38:43 [name]  ([id number]) -------------------------------------------------------------------------------- Physician Orders Details Patient Name: [name] Date of Service: 10/08/2021 2:45 PM Medical Record Number: [id number] Patient Account Number: [id number] Date of Birth/Sex: [birth year] (76 y.o. F) Treating RN: Carlene Coria Primary Care Provider: [name] Other Clinician: Referring Provider: [name] Treating Provider/Extender: Skipper Cliche in Treatment: 37 Verbal / Phone Orders: No Diagnosis Coding ICD-10 Coding Code Description I89.0 Lymphedema, not elsewhere classified I87.332 Chronic venous hypertension (idiopathic) with ulcer and inflammation of left lower extremity L97.828 Non-pressure chronic ulcer of other part of left lower leg with other specified severity Follow-up Appointments o Return Appointment in 1 week. Bathing/ Shower/ Hygiene o May shower with wound dressing protected with water repellent cover or cast protector. Edema Control - Lymphedema / Segmental Compressive Device / Other Left Lower Extremity o Optional: One layer of unna paste to top of compression wrap (to act as an anchor). o Patient to wear own compression stockings. Remove compression stockings every night before going to bed and put on every morning when getting up. - on RIGHT LEG call [facility] for compression sock 15-22 mm/Hg, measurements and phone number on blue sheet [employer] on [employer] in [place] also has the compression socks o Elevate, Exercise Daily and Avoid Standing for Long Periods of Time. o Elevate legs to the level of the heart and pump ankles as often as possible o Elevate leg(s) parallel to the floor when sitting. o Compression Pump: Use compression pump on left lower extremity for 60 minutes, twice daily. - You may pump over wraps o Compression Pump: Use compression pump on right lower extremity for 60 minutes, twice daily. - You may pump  over wraps Wound Treatment Wound #3 - Lower Leg Wound Laterality: Left Primary Dressing: Hydrofera Blue Ready Transfer Foam, 4x5 (in/in) 1 x Per Week/30 Days Discharge Instructions: Apply Hydrofera Blue Ready to wound bed as directed Secondary Dressing: ABD Pad 5x9 (in/in) 1 x Per Week/30 Days Discharge Instructions: Cover with ABD pad Compression Wrap: Profore Lite LF 3 Multilayer Compression Bandaging System 1 x Per Week/30 Days Discharge Instructions: Apply 3 multi-layer wrap as prescribed. Electronic Signature(s) Signed: 10/08/2021 5:15:34 PM By: Worthy Keeler PA-C Signed: 10/09/2021 1:16:38 PM By: Carlene Coria RN Entered By: Carlene Coria on 10/08/2021 15:25:24 [name] ([id number]) -------------------------------------------------------------------------------- Problem List Details Patient Name: [name] Date of Service:  10/08/2021 2:45 PM Medical Record Number: [id number] Patient Account Number: [id number] Date of Birth/Sex: 1945-10-31 (76 y.o. F) Treating RN: Carlene Coria Primary Care Provider: [name] Other Clinician: Referring Provider: [name] Treating Provider/Extender: Skipper Cliche in Treatment: 37 Active Problems ICD-10 Encounter Code Description Active Date MDM Diagnosis I89.0 Lymphedema, not elsewhere classified 01/16/2021 No Yes I87.332 Chronic venous hypertension (idiopathic) with ulcer and inflammation of 01/16/2021 No Yes left lower extremity L97.828 Non-pressure chronic ulcer of other part of left lower leg with other 01/16/2021 No Yes specified severity Inactive Problems Resolved Problems Electronic Signature(s) Signed: 10/08/2021 2:54:39 PM By: Worthy Keeler PA-C Entered By: Worthy Keeler on 10/08/2021 14:54:38 [name] ([id number]) -------------------------------------------------------------------------------- Progress Note Details Patient Name: [name] Date of Service: 10/08/2021 2:45 PM Medical Record  Number: [id number] Patient Account Number: [id number] Date of Birth/Sex: Dec 14, 1945 (75 y.o. F) Treating RN: Carlene Coria Primary Care Provider: [name] Other Clinician: Referring Provider: [name] Treating Provider/Extender: Skipper Cliche in Treatment: 37 Subjective Chief Complaint Information obtained from Patient Left LE Ulcer History of Present Illness (HPI) 03/15/2020 upon evaluation today patient presents today for initial evaluation here in our clinic concerning a wound that is on the left posterior lower extremity. Unfortunately this has been giving the patient some discomfort at this point which she notes has been affected her pretty much daily. The patient does have a history of venous insufficiency, hypertension, congestive heart failure, and a history of breast cancer. Fortunately there does not appear to be evidence of active infection at this time which is great news. No fevers, chills, nausea, vomiting, or diarrhea. Wound is not extremely large but does have some slough covering the surface of the wound. This is going require sharp debridement today. 03/15/2020 upon evaluation today patient appears to be doing fairly well in regard to her wound. She does have some slough noted on the surface of the wound currently but I do believe the Iodoflex has been beneficial over the past week. There is no signs of active infection at this time which is great news and overall very pleased with the progress. No fevers, chills, nausea, vomiting, or diarrhea. 04/02/2020 upon evaluation today patient appears to be doing a little better in regard to her wound size wise this is not tremendously smaller she does have some slough buildup on the surface of the wound. With that being said this is going require some sharp debridement today to clear away some of the necrotic debris. Fortunately there is no evidence of active infection at this time. No fevers, chills, nausea, vomiting, or  diarrhea. 04/10/2020 on evaluation today patient appears to be doing well with regard to her ulcer which is measuring somewhat smaller today. She actually has more granulation tissue noted which is also good news is no need for sharp debridement today. Fortunately there is no evidence of infection either which is also excellent. No fevers, chills, nausea, vomiting, or diarrhea. 04/26/2020 on evaluation today patient's wound actually is showing signs of good improvement which is great news. There does not appear to be any evidence of active infection. I do believe she is tolerating the collagen at this point. 05/03/2020 upon evaluation today patient's wound actually showed signs of good granulation at this time there does not appear to be any evidence of active infection which is great news and overall very pleased with where things stand. With that being said I do believe that the patient is can require some sharp debridement today but  fortunately nothing too significant. 05/11/20 on evaluation today patient appears to be doing well in regard to her leg ulcer. She's been tolerating the dressing changes without complication. Fortunately there is no signs of active infection at this time. Overall I'm very pleased with where things stand. 05/18/2020 upon evaluation today patient's wound is showing signs of improvement is very dry and I think the collagen for the most part just dried to the wound bed. I am going to clean this away with sharp debridement in order to get this under control in my opinion. 05/25/2020 upon evaluation today patient actually appears to be doing well in regard to her leg ulcer. In fact upon inspection it appears she could potentially be completely healed but that is not guaranteed based on what we are seeing. There does not appear to be any signs of active infection which is great news. 05/31/2020 on evaluation today patient appears to be doing well with regard to her wounds. She in fact  appears to be almost completely healed on the left leg there is just a very small opening remaining point 1 06/08/2020 upon evaluation today patient appears to be doing excellent in regard to her leg ulcer on the left in fact it appears to be that she is completely healed as of today. I see no signs of active infection at this time which is great news. No fevers, chills, nausea, vomiting, or diarrhea. READMISSION 01/16/2021 Patient that we have had in this clinic with a wound on the left posterior calf in the summer 2021 extending into October. She was felt to have chronic venous insufficiency. Per the patient's description she was discharged with what sounds like external compression stockings although she could not afford the "$75 per leg". She therefore did not get anything. Apparently her wound that she has currently is been in existence since January. She has been followed at vein and vascular with Unna boots and I think calcium alginate. She had ABIs done on 06/25/2020 that showed an noncompressible ABI on the right leg and the left leg but triphasic waveforms with good great toe pressures and a TBI of 0.90 on the right and 0.91 on the left Surprisingly the patient also had venous reflux studies that were really unremarkable. This included no evidence of DVTs or SVTs but there was no evidence of deep venous insufficiency or superficial venous insufficiency in the greater saphenous or short saphenous veins bilaterally. I would wonder about more central venous issues or perhaps this is all lymphedema 01/25/2021 upon evaluation today patient appears to be doing decently well and guard to her wound. The good news is the Iodoflex that is job she saw Dr. Dellia Nims last week for readmission and to be honest I think that this has done extremely well over that week. I do believe that she can tolerate the sharp debridement today which will be good. I think that cleaning off the wound will likely allow [place] to  be able to use a different type of dressing I do not think the Iodoflex will even be necessary based on how clean the wound looks. Fortunately there is no sign of active infection at this time which is great news. No fevers, chills, nausea, vomiting, or diarrhea. [name], [name] ([id number]) 02/12/2021 upon evaluation today patient appears to be doing well with regard to her leg ulcer. We did switch to alginate when she came for nurse visit I think is doing much better for her. Fortunately there does not appear to be  any signs of active infection at this time which is great news. No fevers, chills, nausea, vomiting, or diarrhea. 02/18/2021 upon evaluation today patient's wound is actually showing signs of good improvement. I am very pleased with where things stand currently. I do not see any signs of active infection which is great news and overall I feel like she is making good progress. The patient likewise is happy that things are doing so well and that this is measuring somewhat smaller. 02/25/2021 upon evaluation today patient actually appears to be doing quite well in regard to her wounds. She has been tolerating the dressing changes without complication. Fortunately there does not appear to be any signs of active infection which is great news and overall I am extremely pleased with where things stand. No fevers, chills, nausea, vomiting, or diarrhea. She does have a lot of edema I really do think she needs compression socks to be worn in order to prevent things from causing additional issues for her currently. Especially on the right leg she should be wearing compression socks all the time to be honest. 03/04/2021 upon evaluation today patient's leg ulcer actually appears to be doing pretty well which is great news. There does not appear to be any significant change but overall the appearance is better even though the size is not necessarily reflecting a great improvement. Fortunately there does  not appear to be any signs of active infection. No fevers, chills, nausea, vomiting, or diarrhea. 03/12/2021 upon evaluation today patient appears to be doing well with regard to her wounds. She has been tolerating the dressing changes without complication. The good news is that the wound on her left lateral leg is doing much better and is showing signs of good improvement. Overall her swelling is controlled well as well. She does need some compression socks she plans to go Jefferey socks next week. 03/18/2021 on evaluation today patient's wound actually is showing signs of good improvement. She does have a little bit of slough this can require some sharp debridement today. 03/25/2021 upon evaluation today patient appears to be doing well with regard to her wound on the left lateral leg. She has been tolerating the dressing changes without complication. Fortunately there does not appear to be any signs of active infection at this time. No fever chills no 04/01/2021 upon evaluation today patient's wound is showing signs of improvement and overall very pleased with where things stand at this point. There is no evidence of active infection which is great news as well and in general I think that she is making great progress. 04/09/2021 upon evaluation today patient appears to be doing well with regard to her ankle ulcer. She is making good progress currently which is great news. There does not appear to be any signs of infection also excellent news. In general I am extremely pleased with where things stand today. No fevers, chills, nausea, vomiting, or diarrhea. 04/23/2021 upon inspection today patient appears to be doing quite well with regard to her leg ulcer. She has been tolerating the dressing changes without complication. She is can require some sharp debridement today but overall seems to be doing excellent. 05/06/2021 upon evaluation today patient unfortunately appears to be doing poorly overall in regard  to her swelling. She is actually significantly more swollen than last week despite the fact that her wound is doing better this is not a good trend. I think that she probably needs to see her cardiologist ASAP and I discussed that with her  today. This includes the fact that honestly I think she probably is getting need an increase in her diuretic or something to try to clear away some of the excess fluid that she is experiencing at the moment. She is not been doing her pumps even twice a day but again right now more concerned that if she were to do the pump she would fluid overload her lungs causing other issues as far as her breathing is concerned. Obviously I think she needs to contact them and move up the appointment for the 12th of something sooner 2 weeks is a bit too long to wait with how swollen she has. 05/14/2021 upon evaluation today patient's wound actually showing signs of being a little bit smaller compared to previous. She has been making good progress however. Fortunately there does not appear to be any evidence of active infection which is great. Overall I am extremely pleased in that regard. Nonetheless I am still concerned that she is having quite a bit of issue as far as the swelling is concerned she has been placed on fluid restriction as well as an increase in the furosemide by her doctor. We will see how things progress. 05/21/2021 upon evaluation today patient appears to be doing well with regard to her wound in general. She has been tolerating the dressing changes without complication. With that being said she has been using silver cell for a while and while this was showing signs of improvement it is now gotten to the point where this has slowed down quite significantly. I do believe that switching to a different dressing may be beneficial for her today. 05/28/2021 upon evaluation today patient actually appears to be doing quite well in regard to her wound. In fact there is really  not any need for sharp debridement today based on what I see. The Hydrofera Blue is doing great and overall I think that she is managing quite nicely with the compression wrapping. Her edema is still down quite a bit with the wrapping in place. 06/11/2021 upon evaluation today patient appears to be doing well with regard to her leg ulcer. She has been tolerating the dressing changes without complication. Fortunately there does not appear to be any signs of active infection at this time which is great news. No fevers, chills, nausea, vomiting, or diarrhea. She is going require some sharp debridement today. 10/11; left leg ulcer much better looking and with improved surface area. She is using Hydrofera Blue under 3 layer compression. She does not have any stocking on the right leg which in itself has very significant nonpitting edema 06/25/2021 upon evaluation today patient appears to be doing well with regard to the wound on her left leg. She has been tolerating the dressing changes without complication and overall I am extremely pleased with where things stand today. I do not see any evidence of infection which is great news. 07/02/2021 upon evaluation today patient's wound actually showing signs of excellent improvement and actually very pleased with where we stand today and the overall appearance. Fortunately there does not appear to be any signs of active infection. No fevers, chills, nausea, vomiting, or diarrhea. 07/09/2021 upon evaluation today patient appears to be doing better in regard to her wound this is measuring smaller and she is headed in the appropriate direction based on what I am seeing currently. I do not see any evidence of active infection systemically which is great news. 07/16/2021 upon evaluation today patient appears to be doing pretty  well currently in regard to her leg ulcer. This is measuring smaller and looking much better this is great news. Fortunately there does not  appear to be any evidence of active infection systemically which is great news [name], [name] ([id number]) as well. 07/23/2021 upon evaluation today patient's wound is actually showing signs of good improvement this is definitely measuring smaller. I do not see any signs of infection which is great news and overall very pleased in that regard. Overall I think that she is doing well with the [facility]. 07/30/2020 upon evaluation today patient appears to be doing well in regard to her wound. She has been tolerating the dressing changes without complication think the Hydrofera Blue at this point is actually causing her to dry out a lot as far as the wound bed is concerned. Subsequently I think that we need to try to see what we can do to improve this. Fortunately there does not appear to be any evidence of active infection at this time which is great news. 08/06/2021 upon evaluation today patient appears to be doing well with regards to her wound. Is not measuring significantly smaller initial inspection but upon closer inspection she has a lot of new skin growth across the central portion of the wound. I think will get very close to complete resolution. 08/13/2021 upon evaluation today patient appears to be doing well with regard to her wound. In fact this appears to be I believe healed although there is still some question as to whether it is completely so. I am concerned about the fact that to be honest she still has some evidence of dry skin that could be just trapping something underneath I still want to debride anything as I am afraid of causing some damage to the good skin for that reason I Georgina Peer probably monitor for 1 more week before completely closing everything out. 08/20/2021 upon evaluation today patient actually appears to be doing excellent. In fact she appears to be completely healed. This was the case last week as well we just wanted to make sure nothing reopened since she does  not really have an ability to put on compression socks this is good to be something we need to make sure was well-healed. Nonetheless I think we have achieved that goal as of today. 12/27; this is a patient we discharged 2 weeks ago with wounds on her left lower leg laterally. She was supposed to get stockings and really did not get them. She developed blistering and reopening probably sometime late last week. She has 3 areas in the same area as previously. The mid calf dimensions on the left went from 39 to 51 cm today. She also has very significant edema on the right leg but as far as I am aware has not had wounds in this area 1/4; patient presents for follow-up. She has tolerated the compression wrap well. She has no issues or complaints today. 09/17/2021 upon evaluation today patient appears to be doing well with regard to her wounds. She has been tolerating the dressing changes without complication. Fortunately there does not appear to be any signs of active infection at this time. No fevers, chills, nausea, vomiting, or diarrhea. 10/01/2021 upon evaluation today patient appears to be doing well with regard to her wound this is actually measuring better and looking smaller and I am very pleased in that regard. Fortunately I do not see any evidence of active infection locally nor systemically at this point. No fevers, chills, nausea, vomiting,  or diarrhea. 10/08/2021 upon evaluation patient's wounds are actually showing some signs of improvement measuring a little bit smaller and looking a little bit better. Overall I do not think there is any need for sharp debridement today which is good news. No fevers, chills, nausea, vomiting, or diarrhea. Objective Constitutional Well-nourished and well-hydrated in no acute distress. Vitals Time Taken: 3:00 PM, Height: 69 in, Weight: 244 lbs, BMI: 36, Temperature: 97.8 F, Pulse: 69 bpm, Respiratory Rate: 18 breaths/min, Blood Pressure: 174/74  mmHg. Respiratory normal breathing without difficulty. Psychiatric this patient is able to make decisions and demonstrates good insight into disease process. Alert and Oriented x 3. pleasant and cooperative. General Notes: Upon inspection patient's wound bed showed evidence of good granulation and epithelization at this point. Overall I think that we are headed in the right direction and I think that she is doing much better to she tells me it itches a bit there is really no significant pain both of which are good signs. Integumentary (Hair, Skin) Wound #3 status is Open. Original cause of wound was Gradually Appeared. The date acquired was: 08/15/2021. The wound has been in treatment 5 weeks. The wound is located on the Left Lower Leg. The wound measures 7cm length x 7cm width x 0.1cm depth; 38.485cm^2 area and 3.848cm^3 volume. There is Fat Layer (Subcutaneous Tissue) exposed. There is no tunneling or undermining noted. There is a medium amount of serosanguineous drainage noted. There is medium (34-66%) red, pink, hyper - granulation within the wound bed. There is a medium (34-66%) amount of necrotic tissue within the wound bed including Adherent Slough. 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 There was a Three Layer Compression Therapy Procedure by Carlene Coria, RN. Post procedure Diagnosis Wound #3: Same as Pre-Procedure Plan Follow-up Appointments: Return Appointment in 1 week. Bathing/ Shower/ Hygiene: May shower with wound dressing protected with water repellent cover or cast protector. Edema Control - Lymphedema / Segmental Compressive  Device / Other: Optional: One layer of unna paste to top of compression wrap (to act as an anchor). Patient to wear own compression stockings. Remove compression stockings every night before going to bed and put on every morning when getting up. - on RIGHT LEG call [facility] for compression sock 15-22 mm/Hg, measurements and phone number on blue sheet [employer] on [employer] in [place] also has the compression socks Elevate, Exercise Daily and Avoid Standing for Long Periods of Time. Elevate legs to the level of the heart and pump ankles as often as possible Elevate leg(s) parallel to the floor when sitting. Compression Pump: Use compression pump on left lower extremity for 60 minutes, twice daily. - You may pump over wraps Compression Pump: Use compression pump on right lower extremity for 60 minutes, twice daily. - You may pump over wraps WOUND #3: - Lower Leg Wound Laterality: Left Primary Dressing: Hydrofera Blue Ready Transfer Foam, 4x5 (in/in) 1 x Per Week/30 Days Discharge Instructions: Apply Hydrofera Blue Ready to wound bed as directed Secondary Dressing: ABD Pad 5x9 (in/in) 1 x Per Week/30 Days Discharge Instructions: Cover with ABD pad Compression Wrap: Profore Lite LF 3 Multilayer Compression Bandaging System 1 x Per Week/30 Days Discharge Instructions: Apply 3 multi-layer  wrap as prescribed. 1. Would recommend that we continue currently with the wound care measures as before specifically with the [facility] which I think is doing a great job. 2. Also can recommend that we continue with an ABD pad to cover. 3. We will also continue with the 3 layer compression wrap which is doing a good job of keeping edema control. We will see patient back for reevaluation in 1 week here in the clinic. If anything worsens or changes patient will contact our office for additional recommendations. Electronic Signature(s) Signed: 10/08/2021 4:39:09 PM By: Worthy Keeler  PA-C Entered By: Worthy Keeler on 10/08/2021 16:39:08 [name] ([id number]) -------------------------------------------------------------------------------- SuperBill Details Patient Name: [name] Date of Service: 10/08/2021 Medical Record Number: [id number] Patient Account Number: [id number] Date of Birth/Sex: [birth year] (76 y.o. F) Treating RN: Carlene Coria Primary Care Provider: [name] Other Clinician: Referring Provider: [name] Treating Provider/Extender: Skipper Cliche in Treatment: 37 Diagnosis Coding ICD-10 Codes Code Description I89.0 Lymphedema, not elsewhere classified I87.332 Chronic venous hypertension (idiopathic) with ulcer and inflammation of left lower extremity L97.828 Non-pressure chronic ulcer of other part of left lower leg with other specified severity Facility Procedures CPT4 Code: [id number] Description: (Facility Use Only) [id number] - [place] LWR LT LEG Modifier: Quantity: 1 Physician Procedures CPT4 Code Description: [id number] - WC PHYS LEVEL 3 - EST PT Modifier: Quantity: 1 CPT4 Code Description: ICD-10 Diagnosis Description I89.0 Lymphedema, not elsewhere classified L97.828 Non-pressure chronic ulcer of other part of left lower leg with other spec I87.332 Chronic venous hypertension (idiopathic) with ulcer and inflammation  of le Modifier: ified severity ft lower extremity Quantity: Electronic Signature(s) Signed: 10/08/2021 4:47:33 PM By: Worthy Keeler PA-C Entered By: Worthy Keeler on 10/08/2021 16:47:33

## 2021-10-08 NOTE — Progress Notes (Addendum)
[name] ([id number]) Visit Report for 10/08/2021 Arrival Information Details Patient Name: [name] Date of Service: 10/08/2021 2:45 PM Medical Record Number: [id number] Patient Account Number: [id number] Date of Birth/Sex: Oct 17, 1945 (76 y.o. F) Treating RN: Carlene Coria Primary Care Liset Mcmonigle: [name] Other Clinician: Referring Erial Fikes: [name] Treating Myrian Botello/Extender: Skipper Cliche in Treatment: 73 Visit Information History Since Last Visit All ordered tests and consults were completed: No Patient Arrived: Wheel Chair Added or deleted any medications: No Arrival Time: 14:56 Any new allergies or adverse reactions: No Accompanied By: self Had a fall or experienced change in No Transfer Assistance: None activities of daily living that may affect Patient Identification Verified: Yes risk of falls: Secondary Verification Process Completed: Yes Signs or symptoms of abuse/neglect since last visito No Patient Requires Transmission-Based Precautions: No Hospitalized since last visit: No Patient Has Alerts: Yes Implantable device outside of the clinic excluding No Patient Alerts: NOT DIABETIC cellular tissue based products placed in the center since last visit: Has Dressing in Place as Prescribed: Yes Has Compression in Place as Prescribed: Yes Pain Present Now: No Electronic Signature(s) Signed: 10/09/2021 1:16:38 PM By: Carlene Coria RN Entered By: Carlene Coria on 10/08/2021 15:01:26 [name] ([id number]) -------------------------------------------------------------------------------- Clinic Level of Care Assessment Details Patient Name: [name] Date of Service: 10/08/2021 2:45 PM Medical Record Number: [id number] Patient Account Number: [id number] Date of Birth/Sex: [birth year] (76 y.o. F) Treating RN: Carlene Coria Primary Care Kelis Plasse: [name] Other Clinician: Referring Eleonor Ocon: [name] Treating Mataeo Ingwersen/Extender:  Skipper Cliche in Treatment: 37 Clinic Level of Care Assessment Items TOOL 4 Quantity Score []  - Use when only an EandM is performed on FOLLOW-UP visit 0 ASSESSMENTS - Nursing Assessment / Reassessment []  - Reassessment of Co-morbidities (includes updates in patient status) 0 []  - 0 Reassessment of Adherence to Treatment Plan ASSESSMENTS - Wound and Skin Assessment / Reassessment []  - Simple Wound Assessment / Reassessment - one wound 0 []  - 0 Complex Wound Assessment / Reassessment - multiple wounds []  - 0 Dermatologic / Skin Assessment (not related to wound area) ASSESSMENTS - Focused Assessment []  - Circumferential Edema Measurements - multi extremities 0 []  - 0 Nutritional Assessment / Counseling / Intervention []  - 0 Lower Extremity Assessment (monofilament, tuning fork, pulses) []  - 0 Peripheral Arterial Disease Assessment (using hand held doppler) ASSESSMENTS - Ostomy and/or Continence Assessment and Care []  - Incontinence Assessment and Management 0 []  - 0 Ostomy Care Assessment and Management (repouching, etc.) PROCESS - Coordination of Care []  - Simple Patient / Family Education for ongoing care 0 []  - 0 Complex (extensive) Patient / Family Education for ongoing care []  - 0 Staff obtains Programmer, systems, Records, Test Results / Process Orders []  - 0 Staff telephones HHA, Nursing Homes / Clarify orders / etc []  - 0 Routine Transfer to another Facility (non-emergent condition) []  - 0 Routine Hospital Admission (non-emergent condition) []  - 0 New Admissions / Biomedical engineer / Ordering NPWT, Apligraf, etc. []  - 0 Emergency Hospital Admission (emergent condition) []  - 0 Simple Discharge Coordination []  - 0 Complex (extensive) Discharge Coordination PROCESS - Special Needs []  - Pediatric / Minor Patient Management 0 []  - 0 Isolation Patient Management []  - 0 Hearing / Language / Visual special needs []  - 0 Assessment of Community assistance  (transportation, D/C planning, etc.) []  - 0 Additional assistance / Altered mentation []  - 0 Support Surface(s) Assessment (bed, cushion, seat, etc.) INTERVENTIONS - Wound Cleansing / Measurement [name] ([id number]) []  - 0 Simple  Wound Cleansing - one wound []  - 0 Complex Wound Cleansing - multiple wounds []  - 0 Wound Imaging (photographs - any number of wounds) []  - 0 Wound Tracing (instead of photographs) []  - 0 Simple Wound Measurement - one wound []  - 0 Complex Wound Measurement - multiple wounds INTERVENTIONS - Wound Dressings []  - Small Wound Dressing one or multiple wounds 0 []  - 0 Medium Wound Dressing one or multiple wounds []  - 0 Large Wound Dressing one or multiple wounds []  - 0 Application of Medications - topical []  - 0 Application of Medications - injection INTERVENTIONS - Miscellaneous []  - External ear exam 0 []  - 0 Specimen Collection (cultures, biopsies, blood, body fluids, etc.) []  - 0 Specimen(s) / Culture(s) sent or taken to Lab for analysis []  - 0 Patient Transfer (multiple staff / Civil Service fast streamer / Similar devices) []  - 0 Simple Staple / Suture removal (25 or less) []  - 0 Complex Staple / Suture removal (26 or more) []  - 0 Hypo / Hyperglycemic Management (close monitor of Blood Glucose) []  - 0 Ankle / Brachial Index (ABI) - do not check if billed separately []  - 0 Vital Signs Has the patient been seen at the hospital within the last three years: Yes Total Score: 0 Level Of Care: ____ Electronic Signature(s) Signed: 10/09/2021 1:16:38 PM By: Carlene Coria RN Entered By: Carlene Coria on 10/08/2021 15:25:51 [name] ([id number]) -------------------------------------------------------------------------------- Compression Therapy Details Patient Name: [name] Date of Service: 10/08/2021 2:45 PM Medical Record Number: [id number] Patient Account Number: [id number] Date of Birth/Sex: [birth year] (76 y.o. F) Treating RN: Carlene Coria Primary Care Traci Gafford: [name] Other Clinician: Referring Arin Peral: [name] Treating Degrace Arizola/Extender: Skipper Cliche in Treatment: 37 Compression Therapy Performed for Wound Assessment: Wound #3 Left Lower Leg Performed By: Clinician Carlene Coria, RN Compression Type: Three Layer Post Procedure Diagnosis Same as Pre-procedure Electronic Signature(s) Signed: 10/09/2021 1:16:38 PM By: Carlene Coria RN Entered By: Carlene Coria on 10/08/2021 15:24:21 [name] ([id number]) -------------------------------------------------------------------------------- Encounter Discharge Information Details Patient Name: [name] Date of Service: 10/08/2021 2:45 PM Medical Record Number: [id number] Patient Account Number: [id number] Date of Birth/Sex: 07-Mar-1946 (76 y.o. F) Treating RN: Carlene Coria Primary Care Clotilde Loth: [name] Other Clinician: Referring Gerardo Caiazzo: [name] Treating Zahriah Roes/Extender: Skipper Cliche in Treatment: 37 Encounter Discharge Information Items Discharge Condition: Stable Ambulatory Status: Wheelchair Discharge Destination: Home Transportation: Private Auto Accompanied By: self Schedule Follow-up Appointment: Yes Clinical Summary of Care: Patient Declined Electronic Signature(s) Signed: 10/09/2021 1:16:38 PM By: Carlene Coria RN Entered By: Carlene Coria on 10/08/2021 15:27:52 [name] ([id number]) -------------------------------------------------------------------------------- Lower Extremity Assessment Details Patient Name: [name] Date of Service: 10/08/2021 2:45 PM Medical Record Number: [id number] Patient Account Number: [id number] Date of Birth/Sex: 1946/07/14 (76 y.o. F) Treating RN: Carlene Coria Primary Care Temeka Pore: [name] Other Clinician: Referring Kinya Meine: [name] Treating Dequincy Born/Extender: Skipper Cliche in Treatment: 37 Edema Assessment Assessed: [Left: No] [Right: No] Edema:  [Left: Ye] [Right: s] Calf Left: Right: Point of Measurement: 27 cm From Medial Instep 45 cm Ankle Left: Right: Point of Measurement: 10 cm From Medial Instep 26 cm Vascular Assessment Pulses: Dorsalis Pedis Palpable: [Left:Yes] Electronic Signature(s) Signed: 10/09/2021 1:16:38 PM By: Carlene Coria RN Entered By: Carlene Coria on 10/08/2021 15:08:39 [name] ([id number]) -------------------------------------------------------------------------------- Multi Wound Chart Details Patient Name: [name] Date of Service: 10/08/2021 2:45 PM Medical Record Number: [id number] Patient Account Number: [id number] Date of Birth/Sex: Feb 25, 1946 (75 y.o. F) Treating RN: Carlene Coria Primary Care Amoni Morales:  [name] Other Clinician: Referring Shawnna Pancake: [name] Treating Tabari Volkert/Extender: Skipper Cliche in Treatment: 37 Vital Signs Height(in): 69 Pulse(bpm): 84 Weight(lbs): 244 Blood Pressure(mmHg): 174/74 Body Mass Index(BMI): 36 Temperature(F): 97.8 Respiratory Rate(breaths/min): 18 Photos: [N/A:N/A] Wound Location: Left Lower Leg N/A N/A Wounding Event: Gradually Appeared N/A N/A Primary Etiology: Lymphedema N/A N/A Comorbid History: Cataracts, Lymphedema, Sleep N/A N/A Apnea, Congestive Heart Failure, Hypertension, Osteoarthritis, Neuropathy, Received Chemotherapy, Received Radiation Date Acquired: 08/15/2021 N/A N/A Weeks of Treatment: 5 N/A N/A Wound Status: Open N/A N/A Wound Recurrence: No N/A N/A Measurements L x W x D (cm) 7x7x0.1 N/A N/A Area (cm) : 38.485 N/A N/A Volume (cm) : 3.848 N/A N/A % Reduction in Area: 35.90% N/A N/A % Reduction in Volume: 36.00% N/A N/A Classification: Full Thickness Without Exposed N/A N/A Support Structures Exudate Amount: Medium N/A N/A Exudate Type: Serosanguineous N/A N/A Exudate Color: red, brown N/A N/A Granulation Amount: Medium (34-66%) N/A N/A Granulation Quality: Red, Pink, Hyper-granulation N/A  N/A Necrotic Amount: Medium (34-66%) N/A N/A Exposed Structures: Fat Layer (Subcutaneous Tissue): N/A N/A Yes Fascia: No Tendon: No Muscle: No Joint: No Bone: No Epithelialization: None N/A N/A Treatment Notes Electronic Signature(s) Signed: 10/09/2021 1:16:38 PM By: Carlene Coria RN Entered By: Carlene Coria on 10/08/2021 15:23:17 [name], [name] ([id number]) [name], [name] ([id number]) -------------------------------------------------------------------------------- Multi-Disciplinary Care Plan Details Patient Name: [name] Date of Service: 10/08/2021 2:45 PM Medical Record Number: [id number] Patient Account Number: [id number] Date of Birth/Sex: Jan 22, 1946 (76 y.o. F) Treating RN: Carlene Coria Primary Care Valleri Hendricksen: [name] Other Clinician: Referring Gisell Buehrle: [name] Treating Donia Yokum/Extender: Skipper Cliche in Treatment: 37 Active Inactive Electronic Signature(s) Signed: 10/09/2021 1:16:38 PM By: Carlene Coria RN Entered By: Carlene Coria on 10/08/2021 15:23:06 [name] ([id number]) -------------------------------------------------------------------------------- Pain Assessment Details Patient Name: [name] Date of Service: 10/08/2021 2:45 PM Medical Record Number: [id number] Patient Account Number: [id number] Date of Birth/Sex: 1945-10-17 (76 y.o. F) Treating RN: Carlene Coria Primary Care Stevie Charter: [name] Other Clinician: Referring Shalik Sanfilippo: [name] Treating Dalbert Stillings/Extender: Skipper Cliche in Treatment: 37 Active Problems Location of Pain Severity and Description of Pain Patient Has Paino No Site Locations Pain Management and Medication Current Pain Management: Electronic Signature(s) Signed: 10/09/2021 1:16:38 PM By: Carlene Coria RN Entered By: Carlene Coria on 10/08/2021 15:01:59 [name] ([id number]) -------------------------------------------------------------------------------- Patient/Caregiver  Education Details Patient Name: [name] Date of Service: 10/08/2021 2:45 PM Medical Record Number: [id number] Patient Account Number: [id number] Date of Birth/Gender: [birth year] (75 y.o. F) Treating RN: Carlene Coria Primary Care Physician: [name] Other Clinician: Referring Physician: [name] Treating Physician/Extender: Skipper Cliche in Treatment: 37 Education Assessment Education Provided To: Patient Education Topics Provided Wound/Skin Impairment: Methods: Explain/Verbal Responses: State content correctly Electronic Signature(s) Signed: 10/09/2021 1:16:38 PM By: Carlene Coria RN Entered By: Carlene Coria on 10/08/2021 15:26:43 [name] ([id number]) -------------------------------------------------------------------------------- Wound Assessment Details Patient Name: [name] Date of Service: 10/08/2021 2:45 PM Medical Record Number: [id number] Patient Account Number: [id number] Date of Birth/Sex: [birth year] (76 y.o. F) Treating RN: Carlene Coria Primary Care Malika Demario: [name] Other Clinician: Referring Jamelia Varano: [name] Treating Rusell Meneely/Extender: Skipper Cliche in Treatment: 37 Wound Status Wound Number: 3 Primary Lymphedema Etiology: Wound Location: Left Lower Leg Wound Open Wounding Event: Gradually Appeared Status: Date Acquired: 08/15/2021 Comorbid Cataracts, Lymphedema, Sleep Apnea, Congestive Heart Weeks Of Treatment: 5 History: Failure, Hypertension, Osteoarthritis, Neuropathy, Clustered Wound: No Received Chemotherapy, Received Radiation Photos Wound Measurements Length: (cm) 7 Width: (cm) 7 Depth: (cm) 0.1 Area: (cm) 38.485 Volume: (cm) 3.848 %  Reduction in Area: 35.9% % Reduction in Volume: 36% Epithelialization: None Tunneling: No Undermining: No Wound Description Classification: Full Thickness Without Exposed Support Structu Exudate Amount: Medium Exudate Type: Serosanguineous Exudate Color: red,  brown res Foul Odor After Cleansing: No Slough/Fibrino Yes Wound Bed Granulation Amount: Medium (34-66%) Exposed Structure Granulation Quality: Red, Pink, Hyper-granulation Fascia Exposed: No Necrotic Amount: Medium (34-66%) Fat Layer (Subcutaneous Tissue) Exposed: Yes Necrotic Quality: Adherent Slough Tendon Exposed: No Muscle Exposed: No Joint Exposed: No Bone Exposed: No Treatment Notes Wound #3 (Lower Leg) Wound Laterality: Left Cleanser Peri-Wound Care Topical Primary Dressing [name], [name] ([id number]) Hydrofera Blue Ready Transfer Foam, 4x5 (in/in) Discharge Instruction: Apply Hydrofera Blue Ready to wound bed as directed Secondary Dressing ABD Pad 5x9 (in/in) Discharge Instruction: Cover with ABD pad Secured With Compression Wrap Profore Lite LF 3 Multilayer Compression Bandaging System Discharge Instruction: Apply 3 multi-layer wrap as prescribed. Compression Stockings Add-Ons Electronic Signature(s) Signed: 10/09/2021 1:16:38 PM By: Carlene Coria RN Entered By: Carlene Coria on 10/08/2021 15:08:07 [name] ([id number]) -------------------------------------------------------------------------------- Vitals Details Patient Name: [name] Date of Service: 10/08/2021 2:45 PM Medical Record Number: [id number] Patient Account Number: [id number] Date of Birth/Sex: Aug 02, 1946 (76 y.o. F) Treating RN: Carlene Coria Primary Care Trevonne Nyland: [name] Other Clinician: Referring Tashanda Fuhrer: [name] Treating Michell Giuliano/Extender: Skipper Cliche in Treatment: 37 Vital Signs Time Taken: 15:00 Temperature (F): 97.8 Height (in): 69 Pulse (bpm): 69 Weight (lbs): 244 Respiratory Rate (breaths/min): 18 Body Mass Index (BMI): 36 Blood Pressure (mmHg): 174/74 Reference Range: 80 - 120 mg / dl Electronic Signature(s) Signed: 10/09/2021 1:16:38 PM By: Carlene Coria RN Entered By: Carlene Coria on 10/08/2021 15:01:48

## 2021-10-14 ENCOUNTER — Encounter: Payer: Medicare Other | Attending: Physician Assistant | Admitting: Physician Assistant

## 2021-10-14 ENCOUNTER — Other Ambulatory Visit: Payer: Self-pay

## 2021-10-14 DIAGNOSIS — I509 Heart failure, unspecified: Secondary | ICD-10-CM | POA: Insufficient documentation

## 2021-10-14 DIAGNOSIS — L97822 Non-pressure chronic ulcer of other part of left lower leg with fat layer exposed: Secondary | ICD-10-CM | POA: Insufficient documentation

## 2021-10-14 DIAGNOSIS — I872 Venous insufficiency (chronic) (peripheral): Secondary | ICD-10-CM | POA: Insufficient documentation

## 2021-10-14 DIAGNOSIS — I890 Lymphedema, not elsewhere classified: Secondary | ICD-10-CM | POA: Insufficient documentation

## 2021-10-14 DIAGNOSIS — I87332 Chronic venous hypertension (idiopathic) with ulcer and inflammation of left lower extremity: Secondary | ICD-10-CM | POA: Insufficient documentation

## 2021-10-14 DIAGNOSIS — I110 Hypertensive heart disease with heart failure: Secondary | ICD-10-CM | POA: Diagnosis not present

## 2021-10-14 NOTE — Progress Notes (Addendum)
ERMEL, VERNE ([id number]) Visit Report for 10/14/2021 Chief Complaint Document Details Patient Name: [name], [name] Date of Service: 10/14/2021 11:30 AM Medical Record Number: [id number] Patient Account Number: [id number] Date of Birth/Sex: [birth year] (76 y.o. F) Treating RN: Donnamarie Poag Primary Care Provider: [name] Other Clinician: Referring Provider: [name] Treating Provider/Extender: Skipper Cliche in Treatment: 38 Information Obtained from: Patient Chief Complaint Left LE Ulcer Electronic Signature(s) Signed: 10/14/2021 11:15:31 AM By: Worthy Keeler PA-C Entered By: Worthy Keeler on 10/14/2021 11:15:31 [name] ([id number]) -------------------------------------------------------------------------------- HPI Details Patient Name: [name] Date of Service: 10/14/2021 11:30 AM Medical Record Number: [id number] Patient Account Number: [id number] Date of Birth/Sex: 26-Mar-1946 (75 y.o. F) Treating RN: Donnamarie Poag Primary Care Provider: [name] Other Clinician: Referring Provider: [name] Treating Provider/Extender: Skipper Cliche in Treatment: 38 History of Present Illness HPI Description: 03/15/2020 upon evaluation today patient presents today for initial evaluation here in our clinic concerning a wound that is on the left posterior lower extremity. Unfortunately this has been giving the patient some discomfort at this point which she notes has been affected her pretty much daily. The patient does have a history of venous insufficiency, hypertension, congestive heart failure, and a history of breast cancer. Fortunately there does not appear to be evidence of active infection at this time which is great news. No fevers, chills, nausea, vomiting, or diarrhea. Wound is not extremely large but does have some slough covering the surface of the wound. This is going require sharp debridement today. 03/15/2020 upon evaluation today patient appears  to be doing fairly well in regard to her wound. She does have some slough noted on the surface of the wound currently but I do believe the Iodoflex has been beneficial over the past week. There is no signs of active infection at this time which is great news and overall very pleased with the progress. No fevers, chills, nausea, vomiting, or diarrhea. 04/02/2020 upon evaluation today patient appears to be doing a little better in regard to her wound size wise this is not tremendously smaller she does have some slough buildup on the surface of the wound. With that being said this is going require some sharp debridement today to clear away some of the necrotic debris. Fortunately there is no evidence of active infection at this time. No fevers, chills, nausea, vomiting, or diarrhea. 04/10/2020 on evaluation today patient appears to be doing well with regard to her ulcer which is measuring somewhat smaller today. She actually has more granulation tissue noted which is also good news is no need for sharp debridement today. Fortunately there is no evidence of infection either which is also excellent. No fevers, chills, nausea, vomiting, or diarrhea. 04/26/2020 on evaluation today patient's wound actually is showing signs of good improvement which is great news. There does not appear to be any evidence of active infection. I do believe she is tolerating the collagen at this point. 05/03/2020 upon evaluation today patient's wound actually showed signs of good granulation at this time there does not appear to be any evidence of active infection which is great news and overall very pleased with where things stand. With that being said I do believe that the patient is can require some sharp debridement today but fortunately nothing too significant. 05/11/20 on evaluation today patient appears to be doing well in regard to her leg ulcer. She's been tolerating the dressing changes without complication. Fortunately there  is no signs of active infection at this time.  Overall I'm very pleased with where things stand. 05/18/2020 upon evaluation today patient's wound is showing signs of improvement is very dry and I think the collagen for the most part just dried to the wound bed. I am going to clean this away with sharp debridement in order to get this under control in my opinion. 05/25/2020 upon evaluation today patient actually appears to be doing well in regard to her leg ulcer. In fact upon inspection it appears she could potentially be completely healed but that is not guaranteed based on what we are seeing. There does not appear to be any signs of active infection which is great news. 05/31/2020 on evaluation today patient appears to be doing well with regard to her wounds. She in fact appears to be almost completely healed on the left leg there is just a very small opening remaining point 1 06/08/2020 upon evaluation today patient appears to be doing excellent in regard to her leg ulcer on the left in fact it appears to be that she is completely healed as of today. I see no signs of active infection at this time which is great news. No fevers, chills, nausea, vomiting, or diarrhea. READMISSION 01/16/2021 Patient that we have had in this clinic with a wound on the left posterior calf in the summer 2021 extending into October. She was felt to have chronic venous insufficiency. Per the patient's description she was discharged with what sounds like external compression stockings although she could not afford the "$75 per leg". She therefore did not get anything. Apparently her wound that she has currently is been in existence since January. She has been followed at vein and vascular with Unna boots and I think calcium alginate. She had ABIs done on 06/25/2020 that showed an noncompressible ABI on the right leg and the left leg but triphasic waveforms with good great toe pressures and a TBI of 0.90 on the right and 0.91  on the left Surprisingly the patient also had venous reflux studies that were really unremarkable. This included no evidence of DVTs or SVTs but there was no evidence of deep venous insufficiency or superficial venous insufficiency in the greater saphenous or short saphenous veins bilaterally. I would wonder about more central venous issues or perhaps this is all lymphedema 01/25/2021 upon evaluation today patient appears to be doing decently well and guard to her wound. The good news is the Iodoflex that is job she saw Dr. Dellia Nims last week for readmission and to be honest I think that this has done extremely well over that week. I do believe that she can tolerate the sharp debridement today which will be good. I think that cleaning off the wound will likely allow [place] to be able to use a different type of dressing I do not think the Iodoflex will even be necessary based on how clean the wound looks. Fortunately there is no sign of active infection at this time which is great news. No fevers, chills, nausea, vomiting, or diarrhea. 02/12/2021 upon evaluation today patient appears to be doing well with regard to her leg ulcer. We did switch to alginate when she came for nurse visit I think is doing much better for her. Fortunately there does not appear to be any signs of active infection at this time which is great news. No fevers, chills, nausea, vomiting, or diarrhea. [name], [name] ([id number]) 02/18/2021 upon evaluation today patient's wound is actually showing signs of good improvement. I am very pleased with where things stand  currently. I do not see any signs of active infection which is great news and overall I feel like she is making good progress. The patient likewise is happy that things are doing so well and that this is measuring somewhat smaller. 02/25/2021 upon evaluation today patient actually appears to be doing quite well in regard to her wounds. She has been tolerating the  dressing changes without complication. Fortunately there does not appear to be any signs of active infection which is great news and overall I am extremely pleased with where things stand. No fevers, chills, nausea, vomiting, or diarrhea. She does have a lot of edema I really do think she needs compression socks to be worn in order to prevent things from causing additional issues for her currently. Especially on the right leg she should be wearing compression socks all the time to be honest. 03/04/2021 upon evaluation today patient's leg ulcer actually appears to be doing pretty well which is great news. There does not appear to be any significant change but overall the appearance is better even though the size is not necessarily reflecting a great improvement. Fortunately there does not appear to be any signs of active infection. No fevers, chills, nausea, vomiting, or diarrhea. 03/12/2021 upon evaluation today patient appears to be doing well with regard to her wounds. She has been tolerating the dressing changes without complication. The good news is that the wound on her left lateral leg is doing much better and is showing signs of good improvement. Overall her swelling is controlled well as well. She does need some compression socks she plans to go Jefferey socks next week. 03/18/2021 on evaluation today patient's wound actually is showing signs of good improvement. She does have a little bit of slough this can require some sharp debridement today. 03/25/2021 upon evaluation today patient appears to be doing well with regard to her wound on the left lateral leg. She has been tolerating the dressing changes without complication. Fortunately there does not appear to be any signs of active infection at this time. No fever chills no 04/01/2021 upon evaluation today patient's wound is showing signs of improvement and overall very pleased with where things stand at this point. There is no evidence of active  infection which is great news as well and in general I think that she is making great progress. 04/09/2021 upon evaluation today patient appears to be doing well with regard to her ankle ulcer. She is making good progress currently which is great news. There does not appear to be any signs of infection also excellent news. In general I am extremely pleased with where things stand today. No fevers, chills, nausea, vomiting, or diarrhea. 04/23/2021 upon inspection today patient appears to be doing quite well with regard to her leg ulcer. She has been tolerating the dressing changes without complication. She is can require some sharp debridement today but overall seems to be doing excellent. 05/06/2021 upon evaluation today patient unfortunately appears to be doing poorly overall in regard to her swelling. She is actually significantly more swollen than last week despite the fact that her wound is doing better this is not a good trend. I think that she probably needs to see her cardiologist ASAP and I discussed that with her today. This includes the fact that honestly I think she probably is getting need an increase in her diuretic or something to try to clear away some of the excess fluid that she is experiencing at the moment. She is  not been doing her pumps even twice a day but again right now more concerned that if she were to do the pump she would fluid overload her lungs causing other issues as far as her breathing is concerned. Obviously I think she needs to contact them and move up the appointment for the 12th of something sooner 2 weeks is a bit too long to wait with how swollen she has. 05/14/2021 upon evaluation today patient's wound actually showing signs of being a little bit smaller compared to previous. She has been making good progress however. Fortunately there does not appear to be any evidence of active infection which is great. Overall I am extremely pleased in that regard. Nonetheless I am  still concerned that she is having quite a bit of issue as far as the swelling is concerned she has been placed on fluid restriction as well as an increase in the furosemide by her doctor. We will see how things progress. 05/21/2021 upon evaluation today patient appears to be doing well with regard to her wound in general. She has been tolerating the dressing changes without complication. With that being said she has been using silver cell for a while and while this was showing signs of improvement it is now gotten to the point where this has slowed down quite significantly. I do believe that switching to a different dressing may be beneficial for her today. 05/28/2021 upon evaluation today patient actually appears to be doing quite well in regard to her wound. In fact there is really not any need for sharp debridement today based on what I see. The Hydrofera Blue is doing great and overall I think that she is managing quite nicely with the compression wrapping. Her edema is still down quite a bit with the wrapping in place. 06/11/2021 upon evaluation today patient appears to be doing well with regard to her leg ulcer. She has been tolerating the dressing changes without complication. Fortunately there does not appear to be any signs of active infection at this time which is great news. No fevers, chills, nausea, vomiting, or diarrhea. She is going require some sharp debridement today. 10/11; left leg ulcer much better looking and with improved surface area. She is using Hydrofera Blue under 3 layer compression. She does not have any stocking on the right leg which in itself has very significant nonpitting edema 06/25/2021 upon evaluation today patient appears to be doing well with regard to the wound on her left leg. She has been tolerating the dressing changes without complication and overall I am extremely pleased with where things stand today. I do not see any evidence of infection which is great  news. 07/02/2021 upon evaluation today patient's wound actually showing signs of excellent improvement and actually very pleased with where we stand today and the overall appearance. Fortunately there does not appear to be any signs of active infection. No fevers, chills, nausea, vomiting, or diarrhea. 07/09/2021 upon evaluation today patient appears to be doing better in regard to her wound this is measuring smaller and she is headed in the appropriate direction based on what I am seeing currently. I do not see any evidence of active infection systemically which is great news. 07/16/2021 upon evaluation today patient appears to be doing pretty well currently in regard to her leg ulcer. This is measuring smaller and looking much better this is great news. Fortunately there does not appear to be any evidence of active infection systemically which is great news as well.  07/23/2021 upon evaluation today patient's wound is actually showing signs of good improvement this is definitely measuring smaller. I do not see any signs of infection which is great news and overall very pleased in that regard. Overall I think that she is doing well with the [name], [name] ([id number]) [name]. 07/30/2020 upon evaluation today patient appears to be doing well in regard to her wound. She has been tolerating the dressing changes without complication think the Hydrofera Blue at this point is actually causing her to dry out a lot as far as the wound bed is concerned. Subsequently I think that we need to try to see what we can do to improve this. Fortunately there does not appear to be any evidence of active infection at this time which is great news. 08/06/2021 upon evaluation today patient appears to be doing well with regards to her wound. Is not measuring significantly smaller initial inspection but upon closer inspection she has a lot of new skin growth across the central portion of the wound. I think will  get very close to complete resolution. 08/13/2021 upon evaluation today patient appears to be doing well with regard to her wound. In fact this appears to be I believe healed although there is still some question as to whether it is completely so. I am concerned about the fact that to be honest she still has some evidence of dry skin that could be just trapping something underneath I still want to debride anything as I am afraid of causing some damage to the good skin for that reason I Georgina Peer probably monitor for 1 more week before completely closing everything out. 08/20/2021 upon evaluation today patient actually appears to be doing excellent. In fact she appears to be completely healed. This was the case last week as well we just wanted to make sure nothing reopened since she does not really have an ability to put on compression socks this is good to be something we need to make sure was well-healed. Nonetheless I think we have achieved that goal as of today. 12/27; this is a patient we discharged 2 weeks ago with wounds on her left lower leg laterally. She was supposed to get stockings and really did not get them. She developed blistering and reopening probably sometime late last week. She has 3 areas in the same area as previously. The mid calf dimensions on the left went from 39 to 51 cm today. She also has very significant edema on the right leg but as far as I am aware has not had wounds in this area 1/4; patient presents for follow-up. She has tolerated the compression wrap well. She has no issues or complaints today. 09/17/2021 upon evaluation today patient appears to be doing well with regard to her wounds. She has been tolerating the dressing changes without complication. Fortunately there does not appear to be any signs of active infection at this time. No fevers, chills, nausea, vomiting, or diarrhea. 10/01/2021 upon evaluation today patient appears to be doing well with regard to her  wound this is actually measuring better and looking smaller and I am very pleased in that regard. Fortunately I do not see any evidence of active infection locally nor systemically at this point. No fevers, chills, nausea, vomiting, or diarrhea. 10/08/2021 upon evaluation patient's wounds are actually showing some signs of improvement measuring a little bit smaller and looking a little bit better. Overall I do not think there is any need for sharp debridement today which  is good news. No fevers, chills, nausea, vomiting, or diarrhea. 10/14/2021 upon evaluation today patient appears to be doing well with regard to her wound. She has been tolerating the dressing changes without complication and overall I am extremely pleased with where things stand today. There is a little bit of film on the surface of the wounds but this was carefully cleaned away with just saline and gauze she really did not want me to do any sharp debridement today. Electronic Signature(s) Signed: 10/14/2021 11:29:24 AM By: Worthy Keeler PA-C Entered By: Worthy Keeler on 10/14/2021 11:29:24 [name], [name] ([id number]) -------------------------------------------------------------------------------- Physical Exam Details Patient Name: [name] Date of Service: 10/14/2021 11:30 AM Medical Record Number: [id number] Patient Account Number: [id number] Date of Birth/Sex: 11/17/1945 (76 y.o. F) Treating RN: Donnamarie Poag Primary Care Provider: [name] Other Clinician: Referring Provider: [name] Treating Provider/Extender: Skipper Cliche in Treatment: 38 Constitutional Obese and well-hydrated in no acute distress. Respiratory normal breathing without difficulty. Psychiatric this patient is able to make decisions and demonstrates good insight into disease process. Alert and Oriented x 3. pleasant and cooperative. Notes Upon inspection patient's wound bed actually showed signs of good granulation epithelization  at this point. Again the little bit of filaments on the surface of the wound wiped off with saline and gauze no sharp debridement was performed per patient request. She was concerned about the discomfort here. Overall the wounds that appear to be fairly clean postdebridement. Electronic Signature(s) Signed: 10/14/2021 11:30:21 AM By: Worthy Keeler PA-C Entered By: Worthy Keeler on 10/14/2021 11:30:21 [name], [name] ([id number]) -------------------------------------------------------------------------------- Physician Orders Details Patient Name: [name] Date of Service: 10/14/2021 11:30 AM Medical Record Number: [id number] Patient Account Number: [id number] Date of Birth/Sex: 1946-05-17 (76 y.o. F) Treating RN: Donnamarie Poag Primary Care Provider: [name] Other Clinician: Referring Provider: [name] Treating Provider/Extender: Skipper Cliche in Treatment: 75 Verbal / Phone Orders: No Diagnosis Coding ICD-10 Coding Code Description I89.0 Lymphedema, not elsewhere classified I87.332 Chronic venous hypertension (idiopathic) with ulcer and inflammation of left lower extremity L97.828 Non-pressure chronic ulcer of other part of left lower leg with other specified severity Follow-up Appointments o Return Appointment in 1 week. o Nurse Visit as needed Bathing/ Shower/ Hygiene o May shower with wound dressing protected with water repellent cover or cast protector. o No tub bath. Anesthetic (Use 'Patient Medications' Section for Anesthetic Order Entry) o Lidocaine applied to wound bed Edema Control - Lymphedema / Segmental Compressive Device / Other Left Lower Extremity o Optional: One layer of unna paste to top of compression wrap (to act as an anchor). o Patient to wear own compression stockings. Remove compression stockings every night before going to bed and put on every morning when getting up. - on RIGHT LEG call [employer] for compression  sock 15-22 mm/Hg, measurements and phone number on blue sheet [employer] on [employer] in [place] also has the compression socks o Elevate, Exercise Daily and Avoid Standing for Long Periods of Time. o Elevate legs to the level of the heart and pump ankles as often as possible o Elevate leg(s) parallel to the floor when sitting. o Compression Pump: Use compression pump on left lower extremity for 60 minutes, twice daily. - You may pump over wraps o Compression Pump: Use compression pump on right lower extremity for 60 minutes, twice daily. - You may pump over wraps Additional Orders / Instructions o Follow Nutritious Diet and Increase Protein Intake Wound Treatment Wound #3 -  Lower Leg Wound Laterality: Left Cleanser: Soap and Water 1 x Per Week/30 Days Discharge Instructions: Gently cleanse wound with antibacterial soap, rinse and pat dry prior to dressing wounds Cleanser: Wound Cleanser 1 x Per Week/30 Days Discharge Instructions: Wash your hands with soap and water. Remove old dressing, discard into plastic bag and place into trash. Cleanse the wound with Wound Cleanser prior to applying a clean dressing using gauze sponges, not tissues or cotton balls. Do not scrub or use excessive force. Pat dry using gauze sponges, not tissue or cotton balls. Primary Dressing: Hydrofera Blue Ready Transfer Foam, 4x5 (in/in) 1 x Per Week/30 Days Discharge Instructions: Apply Hydrofera Blue Ready to wound bed as directed Secondary Dressing: ABD Pad 5x9 (in/in) 1 x Per Week/30 Days Discharge Instructions: Cover with ABD pad Compression Wrap: Profore Lite LF 3 Multilayer Compression Bandaging System 1 x Per Week/30 Days Discharge Instructions: Apply 3 multi-layer wrap as prescribed. Electronic Signature(s) [place], [place] ([id number]) Signed: 10/14/2021 5:00:48 PM By: Donnamarie Poag Signed: 10/15/2021 10:02:31 AM By: Worthy Keeler PA-C Entered By: Donnamarie Poag on 10/14/2021  11:20:07 [name], [name] ([id number]) -------------------------------------------------------------------------------- Problem List Details Patient Name: [name] Date of Service: 10/14/2021 11:30 AM Medical Record Number: [id number] Patient Account Number: [id number] Date of Birth/Sex: 1945-12-14 (75 y.o. F) Treating RN: Donnamarie Poag Primary Care Provider: [name] Other Clinician: Referring Provider: [name] Treating Provider/Extender: Skipper Cliche in Treatment: 38 Active Problems ICD-10 Encounter Code Description Active Date MDM Diagnosis I89.0 Lymphedema, not elsewhere classified 01/16/2021 No Yes I87.332 Chronic venous hypertension (idiopathic) with ulcer and inflammation of 01/16/2021 No Yes left lower extremity L97.828 Non-pressure chronic ulcer of other part of left lower leg with other 01/16/2021 No Yes specified severity Inactive Problems Resolved Problems Electronic Signature(s) Signed: 10/14/2021 11:15:20 AM By: Worthy Keeler PA-C Entered By: Worthy Keeler on 10/14/2021 11:15:20 [name] ([id number]) -------------------------------------------------------------------------------- Progress Note Details Patient Name: [name] Date of Service: 10/14/2021 11:30 AM Medical Record Number: [id number] Patient Account Number: [id number] Date of Birth/Sex: 03/15/1946 (75 y.o. F) Treating RN: Donnamarie Poag Primary Care Provider: [name] Other Clinician: Referring Provider: [name] Treating Provider/Extender: Skipper Cliche in Treatment: 38 Subjective Chief Complaint Information obtained from Patient Left LE Ulcer History of Present Illness (HPI) 03/15/2020 upon evaluation today patient presents today for initial evaluation here in our clinic concerning a wound that is on the left posterior lower extremity. Unfortunately this has been giving the patient some discomfort at this point which she notes has been affected her pretty  much daily. The patient does have a history of venous insufficiency, hypertension, congestive heart failure, and a history of breast cancer. Fortunately there does not appear to be evidence of active infection at this time which is great news. No fevers, chills, nausea, vomiting, or diarrhea. Wound is not extremely large but does have some slough covering the surface of the wound. This is going require sharp debridement today. 03/15/2020 upon evaluation today patient appears to be doing fairly well in regard to her wound. She does have some slough noted on the surface of the wound currently but I do believe the Iodoflex has been beneficial over the past week. There is no signs of active infection at this time which is great news and overall very pleased with the progress. No fevers, chills, nausea, vomiting, or diarrhea. 04/02/2020 upon evaluation today patient appears to be doing a little better in regard to her wound size wise this is not tremendously smaller she does  have some slough buildup on the surface of the wound. With that being said this is going require some sharp debridement today to clear away some of the necrotic debris. Fortunately there is no evidence of active infection at this time. No fevers, chills, nausea, vomiting, or diarrhea. 04/10/2020 on evaluation today patient appears to be doing well with regard to her ulcer which is measuring somewhat smaller today. She actually has more granulation tissue noted which is also good news is no need for sharp debridement today. Fortunately there is no evidence of infection either which is also excellent. No fevers, chills, nausea, vomiting, or diarrhea. 04/26/2020 on evaluation today patient's wound actually is showing signs of good improvement which is great news. There does not appear to be any evidence of active infection. I do believe she is tolerating the collagen at this point. 05/03/2020 upon evaluation today patient's wound actually showed  signs of good granulation at this time there does not appear to be any evidence of active infection which is great news and overall very pleased with where things stand. With that being said I do believe that the patient is can require some sharp debridement today but fortunately nothing too significant. 05/11/20 on evaluation today patient appears to be doing well in regard to her leg ulcer. She's been tolerating the dressing changes without complication. Fortunately there is no signs of active infection at this time. Overall I'm very pleased with where things stand. 05/18/2020 upon evaluation today patient's wound is showing signs of improvement is very dry and I think the collagen for the most part just dried to the wound bed. I am going to clean this away with sharp debridement in order to get this under control in my opinion. 05/25/2020 upon evaluation today patient actually appears to be doing well in regard to her leg ulcer. In fact upon inspection it appears she could potentially be completely healed but that is not guaranteed based on what we are seeing. There does not appear to be any signs of active infection which is great news. 05/31/2020 on evaluation today patient appears to be doing well with regard to her wounds. She in fact appears to be almost completely healed on the left leg there is just a very small opening remaining point 1 06/08/2020 upon evaluation today patient appears to be doing excellent in regard to her leg ulcer on the left in fact it appears to be that she is completely healed as of today. I see no signs of active infection at this time which is great news. No fevers, chills, nausea, vomiting, or diarrhea. READMISSION 01/16/2021 Patient that we have had in this clinic with a wound on the left posterior calf in the summer 2021 extending into October. She was felt to have chronic venous insufficiency. Per the patient's description she was discharged with what sounds like  external compression stockings although she could not afford the "$75 per leg". She therefore did not get anything. Apparently her wound that she has currently is been in existence since January. She has been followed at vein and vascular with Unna boots and I think calcium alginate. She had ABIs done on 06/25/2020 that showed an noncompressible ABI on the right leg and the left leg but triphasic waveforms with good great toe pressures and a TBI of 0.90 on the right and 0.91 on the left Surprisingly the patient also had venous reflux studies that were really unremarkable. This included no evidence of DVTs or SVTs but there  was no evidence of deep venous insufficiency or superficial venous insufficiency in the greater saphenous or short saphenous veins bilaterally. I would wonder about more central venous issues or perhaps this is all lymphedema 01/25/2021 upon evaluation today patient appears to be doing decently well and guard to her wound. The good news is the Iodoflex that is job she saw Dr. Dellia Nims last week for readmission and to be honest I think that this has done extremely well over that week. I do believe that she can tolerate the sharp debridement today which will be good. I think that cleaning off the wound will likely allow [place] to be able to use a different type of dressing I do not think the Iodoflex will even be necessary based on how clean the wound looks. Fortunately there is no sign of active infection at this time which is great news. No fevers, chills, nausea, vomiting, or diarrhea. [name], [name] ([id number]) 02/12/2021 upon evaluation today patient appears to be doing well with regard to her leg ulcer. We did switch to alginate when she came for nurse visit I think is doing much better for her. Fortunately there does not appear to be any signs of active infection at this time which is great news. No fevers, chills, nausea, vomiting, or diarrhea. 02/18/2021 upon evaluation today  patient's wound is actually showing signs of good improvement. I am very pleased with where things stand currently. I do not see any signs of active infection which is great news and overall I feel like she is making good progress. The patient likewise is happy that things are doing so well and that this is measuring somewhat smaller. 02/25/2021 upon evaluation today patient actually appears to be doing quite well in regard to her wounds. She has been tolerating the dressing changes without complication. Fortunately there does not appear to be any signs of active infection which is great news and overall I am extremely pleased with where things stand. No fevers, chills, nausea, vomiting, or diarrhea. She does have a lot of edema I really do think she needs compression socks to be worn in order to prevent things from causing additional issues for her currently. Especially on the right leg she should be wearing compression socks all the time to be honest. 03/04/2021 upon evaluation today patient's leg ulcer actually appears to be doing pretty well which is great news. There does not appear to be any significant change but overall the appearance is better even though the size is not necessarily reflecting a great improvement. Fortunately there does not appear to be any signs of active infection. No fevers, chills, nausea, vomiting, or diarrhea. 03/12/2021 upon evaluation today patient appears to be doing well with regard to her wounds. She has been tolerating the dressing changes without complication. The good news is that the wound on her left lateral leg is doing much better and is showing signs of good improvement. Overall her swelling is controlled well as well. She does need some compression socks she plans to go Jefferey socks next week. 03/18/2021 on evaluation today patient's wound actually is showing signs of good improvement. She does have a little bit of slough this can require some sharp  debridement today. 03/25/2021 upon evaluation today patient appears to be doing well with regard to her wound on the left lateral leg. She has been tolerating the dressing changes without complication. Fortunately there does not appear to be any signs of active infection at this time. No fever chills no  04/01/2021 upon evaluation today patient's wound is showing signs of improvement and overall very pleased with where things stand at this point. There is no evidence of active infection which is great news as well and in general I think that she is making great progress. 04/09/2021 upon evaluation today patient appears to be doing well with regard to her ankle ulcer. She is making good progress currently which is great news. There does not appear to be any signs of infection also excellent news. In general I am extremely pleased with where things stand today. No fevers, chills, nausea, vomiting, or diarrhea. 04/23/2021 upon inspection today patient appears to be doing quite well with regard to her leg ulcer. She has been tolerating the dressing changes without complication. She is can require some sharp debridement today but overall seems to be doing excellent. 05/06/2021 upon evaluation today patient unfortunately appears to be doing poorly overall in regard to her swelling. She is actually significantly more swollen than last week despite the fact that her wound is doing better this is not a good trend. I think that she probably needs to see her cardiologist ASAP and I discussed that with her today. This includes the fact that honestly I think she probably is getting need an increase in her diuretic or something to try to clear away some of the excess fluid that she is experiencing at the moment. She is not been doing her pumps even twice a day but again right now more concerned that if she were to do the pump she would fluid overload her lungs causing other issues as far as her breathing is concerned.  Obviously I think she needs to contact them and move up the appointment for the 12th of something sooner 2 weeks is a bit too long to wait with how swollen she has. 05/14/2021 upon evaluation today patient's wound actually showing signs of being a little bit smaller compared to previous. She has been making good progress however. Fortunately there does not appear to be any evidence of active infection which is great. Overall I am extremely pleased in that regard. Nonetheless I am still concerned that she is having quite a bit of issue as far as the swelling is concerned she has been placed on fluid restriction as well as an increase in the furosemide by her doctor. We will see how things progress. 05/21/2021 upon evaluation today patient appears to be doing well with regard to her wound in general. She has been tolerating the dressing changes without complication. With that being said she has been using silver cell for a while and while this was showing signs of improvement it is now gotten to the point where this has slowed down quite significantly. I do believe that switching to a different dressing may be beneficial for her today. 05/28/2021 upon evaluation today patient actually appears to be doing quite well in regard to her wound. In fact there is really not any need for sharp debridement today based on what I see. The Hydrofera Blue is doing great and overall I think that she is managing quite nicely with the compression wrapping. Her edema is still down quite a bit with the wrapping in place. 06/11/2021 upon evaluation today patient appears to be doing well with regard to her leg ulcer. She has been tolerating the dressing changes without complication. Fortunately there does not appear to be any signs of active infection at this time which is great news. No fevers, chills, nausea, vomiting,  or diarrhea. She is going require some sharp debridement today. 10/11; left leg ulcer much better looking  and with improved surface area. She is using Hydrofera Blue under 3 layer compression. She does not have any stocking on the right leg which in itself has very significant nonpitting edema 06/25/2021 upon evaluation today patient appears to be doing well with regard to the wound on her left leg. She has been tolerating the dressing changes without complication and overall I am extremely pleased with where things stand today. I do not see any evidence of infection which is great news. 07/02/2021 upon evaluation today patient's wound actually showing signs of excellent improvement and actually very pleased with where we stand today and the overall appearance. Fortunately there does not appear to be any signs of active infection. No fevers, chills, nausea, vomiting, or diarrhea. 07/09/2021 upon evaluation today patient appears to be doing better in regard to her wound this is measuring smaller and she is headed in the appropriate direction based on what I am seeing currently. I do not see any evidence of active infection systemically which is great news. 07/16/2021 upon evaluation today patient appears to be doing pretty well currently in regard to her leg ulcer. This is measuring smaller and looking much better this is great news. Fortunately there does not appear to be any evidence of active infection systemically which is great news [name], [name] ([id number]) as well. 07/23/2021 upon evaluation today patient's wound is actually showing signs of good improvement this is definitely measuring smaller. I do not see any signs of infection which is great news and overall very pleased in that regard. Overall I think that she is doing well with the [name]. 07/30/2020 upon evaluation today patient appears to be doing well in regard to her wound. She has been tolerating the dressing changes without complication think the Hydrofera Blue at this point is actually causing her to dry out a lot as far as  the wound bed is concerned. Subsequently I think that we need to try to see what we can do to improve this. Fortunately there does not appear to be any evidence of active infection at this time which is great news. 08/06/2021 upon evaluation today patient appears to be doing well with regards to her wound. Is not measuring significantly smaller initial inspection but upon closer inspection she has a lot of new skin growth across the central portion of the wound. I think will get very close to complete resolution. 08/13/2021 upon evaluation today patient appears to be doing well with regard to her wound. In fact this appears to be I believe healed although there is still some question as to whether it is completely so. I am concerned about the fact that to be honest she still has some evidence of dry skin that could be just trapping something underneath I still want to debride anything as I am afraid of causing some damage to the good skin for that reason I Georgina Peer probably monitor for 1 more week before completely closing everything out. 08/20/2021 upon evaluation today patient actually appears to be doing excellent. In fact she appears to be completely healed. This was the case last week as well we just wanted to make sure nothing reopened since she does not really have an ability to put on compression socks this is good to be something we need to make sure was well-healed. Nonetheless I think we have achieved that goal as of today. 12/27; this is  a patient we discharged 2 weeks ago with wounds on her left lower leg laterally. She was supposed to get stockings and really did not get them. She developed blistering and reopening probably sometime late last week. She has 3 areas in the same area as previously. The mid calf dimensions on the left went from 39 to 51 cm today. She also has very significant edema on the right leg but as far as I am aware has not had wounds in this area 1/4; patient presents  for follow-up. She has tolerated the compression wrap well. She has no issues or complaints today. 09/17/2021 upon evaluation today patient appears to be doing well with regard to her wounds. She has been tolerating the dressing changes without complication. Fortunately there does not appear to be any signs of active infection at this time. No fevers, chills, nausea, vomiting, or diarrhea. 10/01/2021 upon evaluation today patient appears to be doing well with regard to her wound this is actually measuring better and looking smaller and I am very pleased in that regard. Fortunately I do not see any evidence of active infection locally nor systemically at this point. No fevers, chills, nausea, vomiting, or diarrhea. 10/08/2021 upon evaluation patient's wounds are actually showing some signs of improvement measuring a little bit smaller and looking a little bit better. Overall I do not think there is any need for sharp debridement today which is good news. No fevers, chills, nausea, vomiting, or diarrhea. 10/14/2021 upon evaluation today patient appears to be doing well with regard to her wound. She has been tolerating the dressing changes without complication and overall I am extremely pleased with where things stand today. There is a little bit of film on the surface of the wounds but this was carefully cleaned away with just saline and gauze she really did not want me to do any sharp debridement today. Objective Constitutional Obese and well-hydrated in no acute distress. Vitals Time Taken: 11:02 AM, Height: 69 in, Weight: 244 lbs, BMI: 36, Temperature: 98.2 F, Pulse: 66 bpm, Respiratory Rate: 16 breaths/min, Blood Pressure: 118/74 mmHg. Respiratory normal breathing without difficulty. Psychiatric this patient is able to make decisions and demonstrates good insight into disease process. Alert and Oriented x 3. pleasant and cooperative. General Notes: Upon inspection patient's wound bed actually  showed signs of good granulation epithelization at this point. Again the little bit of filaments on the surface of the wound wiped off with saline and gauze no sharp debridement was performed per patient request. She was concerned about the discomfort here. Overall the wounds that appear to be fairly clean postdebridement. Integumentary (Hair, Skin) Wound #3 status is Open. Original cause of wound was Gradually Appeared. The date acquired was: 08/15/2021. The wound has been in treatment 5 weeks. The wound is located on the Left Lower Leg. The wound measures 6cm length x 6cm width x 0.1cm depth; 28.274cm^2 area and [name], [name] ([id number]) 2.827cm^3 volume. There is Fat Layer (Subcutaneous Tissue) exposed. There is no tunneling or undermining noted. There is a medium amount of serosanguineous drainage noted. There is large (67-100%) red, hyper - granulation within the wound bed. There is a small (1-33%) amount of necrotic tissue within the wound bed including Adherent Slough. 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 There was a Three Layer Compression Therapy Procedure by Donnamarie Poag, RN. Post procedure Diagnosis Wound #3: Same as Pre-Procedure Plan Follow-up Appointments: Return Appointment in 1 week. Nurse Visit as needed Bathing/ Shower/ Hygiene: May shower with wound dressing protected with water repellent cover or cast protector. No tub bath. Anesthetic (Use 'Patient Medications' Section for Anesthetic Order Entry): Lidocaine applied to wound bed Edema Control - Lymphedema / Segmental Compressive Device / Other: Optional: One layer of unna paste to top of compression  wrap (to act as an anchor). Patient to wear own compression stockings. Remove compression stockings every night before going to bed and put on every morning when getting up. - on RIGHT LEG call [facility] for compression sock 15-22 mm/Hg, measurements and phone number on blue sheet [employer] on [employer] in [place] also has the compression socks Elevate, Exercise Daily and Avoid Standing for Long Periods of Time. Elevate legs to the level of the heart and pump ankles as often as possible Elevate leg(s) parallel to the floor when sitting. Compression Pump: Use compression pump on left lower extremity for 60 minutes, twice daily. - You may pump over wraps Compression Pump: Use compression pump on right lower extremity for 60 minutes, twice daily. - You may pump over wraps Additional Orders / Instructions: Follow Nutritious Diet and Increase Protein Intake WOUND #3: - Lower Leg Wound Laterality: Left Cleanser: Soap and Water 1 x Per Week/30 Days Discharge Instructions: Gently cleanse wound with antibacterial soap, rinse and pat dry prior to dressing wounds Cleanser: Wound Cleanser 1 x Per Week/30 Days Discharge Instructions: Wash your hands with soap and water. Remove old dressing, discard into plastic bag and place into trash. Cleanse the wound with Wound Cleanser prior to applying a clean dressing using gauze sponges, not tissues or cotton balls. Do not scrub or use excessive force. Pat dry using gauze sponges, not tissue or cotton balls. Primary Dressing: Hydrofera Blue Ready Transfer Foam, 4x5 (in/in) 1 x Per Week/30 Days Discharge Instructions: Apply Hydrofera Blue Ready to wound bed as directed Secondary Dressing: ABD Pad 5x9 (in/in) 1 x Per Week/30 Days Discharge Instructions: Cover with ABD pad Compression Wrap: Profore Lite LF 3 Multilayer Compression Bandaging System 1 x Per Week/30 Days Discharge Instructions: Apply 3 multi-layer wrap as prescribed. 1. Would  recommend currently that we go ahead and continue with the wound care measures as before and the patient is in agreement with plan. This includes the use of the [facility] dressing which I think is doing a good job. [employer], [name] ([id number]) 2. Also, recommend we have the patient continue with the ABD pad to cover followed by a 3 layer compression wrap. 3. I would also suggest the patient continue to elevate her leg is much as possible she needs to try to keep the edema under good control. We will see patient back for reevaluation in 1 week here in the clinic. If anything worsens or changes patient will contact our office for additional recommendations. Electronic Signature(s) Signed: 10/14/2021 11:31:10 AM By: Worthy Keeler PA-C Entered By: Worthy Keeler on 10/14/2021 11:31:10 [name] ([id number]) -------------------------------------------------------------------------------- SuperBill Details Patient Name: [name] Date of Service: 10/14/2021 Medical Record Number: [id number] Patient Account Number: [id number] Date of Birth/Sex: [birth year] (76 y.o. F) Treating RN: Donnamarie Poag Primary Care Provider: [name] Other Clinician: Referring Provider: [name] Treating Provider/Extender: Skipper Cliche in Treatment: 38 Diagnosis Coding ICD-10 Codes Code Description I89.0  Lymphedema, not elsewhere classified I87.332 Chronic venous hypertension (idiopathic) with ulcer and inflammation of left lower extremity L97.828 Non-pressure chronic ulcer of other part of left lower leg with other specified severity Facility Procedures CPT4 Code: 62703500 Description: (Facility Use Only) [phone] - [place] [facility] LWR LT LEG Modifier: Quantity: 1 Physician Procedures CPT4 Code Description: [id number] [id number] - WC PHYS LEVEL 3 - EST PT Modifier: Quantity: 1 CPT4 Code Description: ICD-10 Diagnosis Description I89.0 Lymphedema, not elsewhere classified 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

## 2021-10-14 NOTE — Progress Notes (Signed)
[name], [name] ([id number]) Visit Report for 10/14/2021 Arrival Information Details Patient Name: [name], [name] Date of Service: 10/14/2021 11:30 AM Medical Record Number: [id number] Patient Account Number: [id number] Date of Birth/Sex: 11-Nov-1945 (76 y.o. F) Treating RN: Donnamarie Poag Primary Care Deston Bilyeu: [name] Other Clinician: Referring Renise Gillies: [name] Treating Garrick Midgley/Extender: Skipper Cliche in Treatment: 38 Visit Information History Since Last Visit Added or deleted any medications: No Patient Arrived: Wheel Chair Had a fall or experienced change in No Arrival Time: 11:00 activities of daily living that may affect Accompanied By: self risk of falls: Transfer Assistance: [employer] Patient Hospitalized since last visit: No Lift Has Dressing in Place as Prescribed: Yes Patient Identification Verified: Yes Has Compression in Place as Prescribed: Yes Secondary Verification Process Completed: Yes Pain Present Now: Yes Patient Requires Transmission-Based No Precautions: Patient Has Alerts: Yes Patient Alerts: NOT DIABETIC Electronic Signature(s) Signed: 10/14/2021 5:00:48 PM By: Donnamarie Poag Entered By: Donnamarie Poag on 10/14/2021 11:03:35 [name] ([id number]) -------------------------------------------------------------------------------- Compression Therapy Details Patient Name: [name] Date of Service: 10/14/2021 11:30 AM Medical Record Number: [id number] Patient Account Number: [id number] Date of Birth/Sex: Jul 31, 1946 (75 y.o. F) Treating RN: Donnamarie Poag Primary Care Naven Giambalvo: [name] Other Clinician: Referring Kaeleb Emond: [name] Treating Manha Amato/Extender: Skipper Cliche in Treatment: 38 Compression Therapy Performed for Wound Assessment: Wound #3 Left Lower Leg Performed By: Clinician Donnamarie Poag, RN Compression Type: Three Layer Post Procedure Diagnosis Same as Pre-procedure Electronic Signature(s) Signed: 10/14/2021  5:00:48 PM By: Donnamarie Poag Entered By: Donnamarie Poag on 10/14/2021 11:18:47 [name] ([id number]) -------------------------------------------------------------------------------- Encounter Discharge Information Details Patient Name: [name] Date of Service: 10/14/2021 11:30 AM Medical Record Number: [id number] Patient Account Number: [id number] Date of Birth/Sex: May 05, 1946 (76 y.o. F) Treating RN: Donnamarie Poag Primary Care Joshwa Hemric: [name] Other Clinician: Referring Dresean Beckel: [name] Treating Macel Yearsley/Extender: Skipper Cliche in Treatment: 62 Encounter Discharge Information Items Discharge Condition: Stable Ambulatory Status: Wheelchair Discharge Destination: Home Transportation: Other Accompanied By: self Schedule Follow-up Appointment: Yes Clinical Summary of Care: Electronic Signature(s) Signed: 10/14/2021 5:00:48 PM By: Donnamarie Poag Entered By: Donnamarie Poag on 10/14/2021 11:24:01 [name] ([id number]) -------------------------------------------------------------------------------- Lower Extremity Assessment Details Patient Name: [name] Date of Service: 10/14/2021 11:30 AM Medical Record Number: [id number] Patient Account Number: [id number] Date of Birth/Sex: [birth year] (75 y.o. F) Treating RN: Donnamarie Poag Primary Care Angell Pincock: [name] Other Clinician: Referring Dante Roudebush: [name] Treating Kemper Heupel/Extender: Skipper Cliche in Treatment: 38 Edema Assessment Assessed: [Left: Yes] [Right: No] [Left: Edema] [Right: :] Calf Left: Right: Point of Measurement: 31 cm From Medial Instep 37 cm Ankle Left: Right: Point of Measurement: 10 cm From Medial Instep 25 cm Vascular Assessment Pulses: Dorsalis Pedis Palpable: [Left:Yes] Electronic Signature(s) Signed: 10/14/2021 5:00:48 PM By: Donnamarie Poag Entered By: Donnamarie Poag on 10/14/2021 11:09:41 [name]  ([id number]) -------------------------------------------------------------------------------- Multi Wound Chart Details Patient Name: [name] Date of Service: 10/14/2021 11:30 AM Medical Record Number: [id number] Patient Account Number: [id number] Date of Birth/Sex: 26-Oct-1945 (75 y.o. F) Treating RN: Donnamarie Poag Primary Care Varnell Orvis: [name] Other Clinician: Referring Caleen Taaffe: [name] Treating Maili Shutters/Extender: Skipper Cliche in Treatment: 38 Vital Signs Height(in): 69 Pulse(bpm): 10 Weight(lbs): 244 Blood Pressure(mmHg): 118/74 Body Mass Index(BMI): 36 Temperature(F): 98.2 Respiratory Rate(breaths/min): 16 Photos: [N/A:N/A] Wound Location: Left Lower Leg N/A N/A Wounding Event: Gradually Appeared N/A N/A Primary Etiology: Lymphedema N/A N/A Comorbid History: Cataracts, Lymphedema, Sleep N/A N/A Apnea, Congestive Heart Failure, Hypertension, Osteoarthritis, Neuropathy, Received Chemotherapy, Received Radiation Date Acquired: 08/15/2021 N/A N/A Weeks of Treatment:  5 N/A N/A Wound Status: Open N/A N/A Wound Recurrence: No N/A N/A Measurements L x W x D (cm) 6x6x0.1 N/A N/A Area (cm) : 28.274 N/A N/A Volume (cm) : 2.827 N/A N/A % Reduction in Area: 52.90% N/A N/A % Reduction in Volume: 52.90% N/A N/A Classification: Full Thickness Without Exposed N/A N/A Support Structures Exudate Amount: Medium N/A N/A Exudate Type: Serosanguineous N/A N/A Exudate Color: red, brown N/A N/A Granulation Amount: Large (67-100%) N/A N/A Granulation Quality: Red, Hyper-granulation N/A N/A Necrotic Amount: Small (1-33%) N/A N/A Exposed Structures: Fat Layer (Subcutaneous Tissue): N/A N/A Yes Fascia: No Tendon: No Muscle: No Joint: No Bone: No Epithelialization: None N/A N/A Treatment Notes Electronic Signature(s) Signed: 10/14/2021 5:00:48 PM By: Donnamarie Poag Entered By: Donnamarie Poag on 10/14/2021 11:10:00 [name] ([id number]) Wynetta Emery, [name]  ([id number]) -------------------------------------------------------------------------------- Multi-Disciplinary Care Plan Details Patient Name: [name] Date of Service: 10/14/2021 11:30 AM Medical Record Number: [id number] Patient Account Number: [id number] Date of Birth/Sex: 1946/01/23 (76 y.o. F) Treating RN: Donnamarie Poag Primary Care Nishita Isaacks: [name] Other Clinician: Referring Bassheva Flury: [name] Treating Ricco Dershem/Extender: Skipper Cliche in Treatment: 19 Active Inactive Electronic Signature(s) Signed: 10/14/2021 5:00:48 PM By: Donnamarie Poag Entered By: Donnamarie Poag on 10/14/2021 11:09:49 [name] ([id number]) -------------------------------------------------------------------------------- Pain Assessment Details Patient Name: [name] Date of Service: 10/14/2021 11:30 AM Medical Record Number: [id number] Patient Account Number: [id number] Date of Birth/Sex: 11/19/1945 (75 y.o. F) Treating RN: Donnamarie Poag Primary Care Leaira Fullam: [name] Other Clinician: Referring Agripina Guyette: [name] Treating Keysean Savino/Extender: Skipper Cliche in Treatment: 38 Active Problems Location of Pain Severity and Description of Pain Patient Has Paino Yes Site Locations Pain Location: Generalized Pain Rate the pain. Current Pain Level: 7 Pain Management and Medication Current Pain Management: Electronic Signature(s) Signed: 10/14/2021 5:00:48 PM By: Donnamarie Poag Entered By: Donnamarie Poag on 10/14/2021 11:07:08 [name] ([id number]) -------------------------------------------------------------------------------- Patient/Caregiver Education Details Patient Name: [name] Date of Service: 10/14/2021 11:30 AM Medical Record Number: [id number] Patient Account Number: [id number] Date of Birth/Gender: [birth year] (76 y.o. F) Treating RN: Donnamarie Poag Primary Care Physician: [name] Other Clinician: Referring Physician: [name] Treating  Physician/Extender: Skipper Cliche in Treatment: 38 Education Assessment Education Provided To: Patient Education Topics Provided Wound/Skin Impairment: Engineer, maintenance) Signed: 10/14/2021 5:00:48 PM By: Donnamarie Poag Entered By: Donnamarie Poag on 10/14/2021 11:21:27 [name] ([id number]) -------------------------------------------------------------------------------- Wound Assessment Details Patient Name: [name] Date of Service: 10/14/2021 11:30 AM Medical Record Number: [id number] Patient Account Number: [id number] Date of Birth/Sex: Jan 24, 1946 (76 y.o. F) Treating RN: Donnamarie Poag Primary Care Margrit Minner: [name] Other Clinician: Referring Ifeanyi Mickelson: [name] Treating Clovis Mankins/Extender: Skipper Cliche in Treatment: 38 Wound Status Wound Number: 3 Primary Lymphedema Etiology: Wound Location: Left Lower Leg Wound Open Wounding Event: Gradually Appeared Status: Date Acquired: 08/15/2021 Comorbid Cataracts, Lymphedema, Sleep Apnea, Congestive Heart Weeks Of Treatment: 5 History: Failure, Hypertension, Osteoarthritis, Neuropathy, Clustered Wound: No Received Chemotherapy, Received Radiation Photos Wound Measurements Length: (cm) 6 Width: (cm) 6 Depth: (cm) 0.1 Area: (cm) 28.274 Volume: (cm) 2.827 % Reduction in Area: 52.9% % Reduction in Volume: 52.9% Epithelialization: None Tunneling: No Undermining: No Wound Description Classification: Full Thickness Without Exposed Support Structu Exudate Amount: Medium Exudate Type: Serosanguineous Exudate Color: red, brown res Foul Odor After Cleansing: No Slough/Fibrino Yes Wound Bed Granulation Amount: Large (67-100%) Exposed Structure Granulation Quality: Red, Hyper-granulation Fascia Exposed: No Necrotic Amount: Small (1-33%) Fat Layer (Subcutaneous Tissue) Exposed: Yes Necrotic Quality: Adherent Slough Tendon Exposed: No Muscle Exposed: No Joint Exposed: No Bone  Exposed: No Treatment  Notes Wound #3 (Lower Leg) Wound Laterality: Left Cleanser Soap and Water Discharge Instruction: Gently cleanse wound with antibacterial soap, rinse and pat dry prior to dressing wounds Wound Cleanser JOHNNY, LATU ([id number]) Discharge Instruction: Wash your hands with soap and water. Remove old dressing, discard into plastic bag and place into trash. Cleanse the wound with Wound Cleanser prior to applying a clean dressing using gauze sponges, not tissues or cotton balls. Do not scrub or use excessive force. Pat dry using gauze sponges, not tissue or cotton balls. Peri-Wound Care Topical Primary Dressing Hydrofera Blue Ready Transfer Foam, 4x5 (in/in) Discharge Instruction: Apply Hydrofera Blue Ready to wound bed as directed Secondary Dressing ABD Pad 5x9 (in/in) Discharge Instruction: Cover with ABD pad Secured With Compression Wrap Profore Lite LF 3 Multilayer Compression Powers Discharge Instruction: Apply 3 multi-layer wrap as prescribed. Compression Stockings Add-Ons Electronic Signature(s) Signed: 10/14/2021 5:00:48 PM By: Donnamarie Poag Entered By: Donnamarie Poag on 10/14/2021 11:09:01 [name] ([id number]) -------------------------------------------------------------------------------- Petronila Details Patient Name: [name] Date of Service: 10/14/2021 11:30 AM Medical Record Number: [id number] Patient Account Number: [id number] Date of Birth/Sex: 01/02/1946 (76 y.o. F) Treating RN: Donnamarie Poag Primary Care Alonzo Loving: [name] Other Clinician: Referring Dmitriy Gair: [name] Treating Chrisma Hurlock/Extender: Skipper Cliche in Treatment: 38 Vital Signs Time Taken: 11:02 Temperature (F): 98.2 Height (in): 69 Pulse (bpm): 66 Weight (lbs): 244 Respiratory Rate (breaths/min): 16 Body Mass Index (BMI): 36 Blood Pressure (mmHg): 118/74 Reference Range: 80 - 120 mg / dl Electronic Signature(s) Signed: 10/14/2021 5:00:48 PM By: Donnamarie Poag Entered  ByDonnamarie Poag on 10/14/2021 11:03:56

## 2021-10-17 ENCOUNTER — Ambulatory Visit: Payer: Medicare Other | Admitting: Radiation Oncology

## 2021-10-22 ENCOUNTER — Other Ambulatory Visit: Payer: Self-pay

## 2021-10-22 DIAGNOSIS — I890 Lymphedema, not elsewhere classified: Secondary | ICD-10-CM | POA: Diagnosis not present

## 2021-10-22 NOTE — Progress Notes (Signed)
[name], [name] ([id number]) Visit Report for 10/22/2021 Arrival Information Details Patient Name: [name] Date of Service: 10/22/2021 11:30 AM Medical Record Number: [id number] Patient Account Number: [id number] Date of Birth/Sex: [birth year] (76 y.o. F) Treating RN: Carlene Coria Primary Care Himmat Enberg: [name] Other Clinician: Referring Ajdin Macke: [name] Treating Hiroshi Krummel/Extender: Skipper Cliche in Treatment: 39 Visit Information History Since Last Visit All ordered tests and consults were completed: No Patient Arrived: Ambulatory Added or deleted any medications: No Arrival Time: 11:30 Any new allergies or adverse reactions: No Accompanied By: self Had a fall or experienced change in No Transfer Assistance: None activities of daily living that may affect Patient Identification Verified: Yes risk of falls: Secondary Verification Process Completed: Yes Signs or symptoms of abuse/neglect since last visito No Patient Requires Transmission-Based Precautions: No Hospitalized since last visit: No Patient Has Alerts: Yes Implantable device outside of the clinic excluding No Patient Alerts: NOT DIABETIC cellular tissue based products placed in the center since last visit: Has Dressing in Place as Prescribed: Yes Pain Present Now: No Electronic Signature(s) Signed: 10/22/2021 11:56:01 AM By: Carlene Coria RN Entered By: Carlene Coria on 10/22/2021 11:56:01 [name] ([id number]) -------------------------------------------------------------------------------- Clinic Level of Care Assessment Details Patient Name: [name] Date of Service: 10/22/2021 11:30 AM Medical Record Number: [id number] Patient Account Number: [id number] Date of Birth/Sex: 1946/08/23 (76 y.o. F) Treating RN: Carlene Coria Primary Care Jesika Men: [name] Other Clinician: Referring Wang Granada: [name] Treating Cornella Emmer/Extender: Skipper Cliche in Treatment: 39 Clinic  Level of Care Assessment Items TOOL 4 Quantity Score []  - Use when only an EandM is performed on FOLLOW-UP visit 0 ASSESSMENTS - Nursing Assessment / Reassessment []  - Reassessment of Co-morbidities (includes updates in patient status) 0 []  - 0 Reassessment of Adherence to Treatment Plan ASSESSMENTS - Wound and Skin Assessment / Reassessment []  - Simple Wound Assessment / Reassessment - one wound 0 []  - 0 Complex Wound Assessment / Reassessment - multiple wounds []  - 0 Dermatologic / Skin Assessment (not related to wound area) ASSESSMENTS - Focused Assessment []  - Circumferential Edema Measurements - multi extremities 0 []  - 0 Nutritional Assessment / Counseling / Intervention []  - 0 Lower Extremity Assessment (monofilament, tuning fork, pulses) []  - 0 Peripheral Arterial Disease Assessment (using hand held doppler) ASSESSMENTS - Ostomy and/or Continence Assessment and Care []  - Incontinence Assessment and Management 0 []  - 0 Ostomy Care Assessment and Management (repouching, etc.) PROCESS - Coordination of Care []  - Simple Patient / Family Education for ongoing care 0 []  - 0 Complex (extensive) Patient / Family Education for ongoing care []  - 0 Staff obtains Programmer, systems, Records, Test Results / Process Orders []  - 0 Staff telephones HHA, Nursing Homes / Clarify orders / etc []  - 0 Routine Transfer to another Facility (non-emergent condition) []  - 0 Routine Hospital Admission (non-emergent condition) []  - 0 New Admissions / Biomedical engineer / Ordering NPWT, Apligraf, etc. []  - 0 Emergency Hospital Admission (emergent condition) []  - 0 Simple Discharge Coordination []  - 0 Complex (extensive) Discharge Coordination PROCESS - Special Needs []  - Pediatric / Minor Patient Management 0 []  - 0 Isolation Patient Management []  - 0 Hearing / Language / Visual special needs []  - 0 Assessment of Community assistance (transportation, D/C planning, etc.) []  - 0 Additional  assistance / Altered mentation []  - 0 Support Surface(s) Assessment (bed, cushion, seat, etc.) INTERVENTIONS - Wound Cleansing / Measurement Top, Marielena ([id number]) []  - 0 Simple Wound Cleansing - one wound []  - 0  Complex Wound Cleansing - multiple wounds []  - 0 Wound Imaging (photographs - any number of wounds) []  - 0 Wound Tracing (instead of photographs) []  - 0 Simple Wound Measurement - one wound []  - 0 Complex Wound Measurement - multiple wounds INTERVENTIONS - Wound Dressings []  - Small Wound Dressing one or multiple wounds 0 []  - 0 Medium Wound Dressing one or multiple wounds []  - 0 Large Wound Dressing one or multiple wounds []  - 0 Application of Medications - topical []  - 0 Application of Medications - injection INTERVENTIONS - Miscellaneous []  - External ear exam 0 []  - 0 Specimen Collection (cultures, biopsies, blood, body fluids, etc.) []  - 0 Specimen(s) / Culture(s) sent or taken to Lab for analysis []  - 0 Patient Transfer (multiple staff / Civil Service fast streamer / Similar devices) []  - 0 Simple Staple / Suture removal (25 or less) []  - 0 Complex Staple / Suture removal (26 or more) []  - 0 Hypo / Hyperglycemic Management (close monitor of Blood Glucose) []  - 0 Ankle / Brachial Index (ABI) - do not check if billed separately []  - 0 Vital Signs Has the patient been seen at the hospital within the last three years: Yes Total Score: 0 Level Of Care: ____ Electronic Signature(s) Unsigned Entered ByCarlene Coria on 10/22/2021 11:58:34 Signature(s): Date(s): [name] ([id number]) -------------------------------------------------------------------------------- Compression Therapy Details Patient Name: [name], [name] Date of Service: 10/22/2021 11:30 AM Medical Record Number: [id number] Patient Account Number: [id number] Date of Birth/Sex: 07/13/1946 (76 y.o. F) Treating RN: Carlene Coria Primary Care Melony Tenpas: [name] Other  Clinician: Referring Mischell Branford: [name] Treating Helia Haese/Extender: Skipper Cliche in Treatment: 39 Compression Therapy Performed for Wound Assessment: Wound #3 Left Lower Leg Performed By: Clinician Carlene Coria, RN Compression Type: Three Layer Electronic Signature(s) Signed: 10/22/2021 11:57:15 AM By: Carlene Coria RN Entered By: Carlene Coria on 10/22/2021 11:57:14 [name] ([id number]) -------------------------------------------------------------------------------- Encounter Discharge Information Details Patient Name: [name] Date of Service: 10/22/2021 11:30 AM Medical Record Number: [id number] Patient Account Number: [id number] Date of Birth/Sex: 11-15-1945 (75 y.o. F) Treating RN: Carlene Coria Primary Care Deisy Ozbun: [name] Other Clinician: Referring Foday Cone: [name] Treating Loyal Rudy/Extender: Skipper Cliche in Treatment: 39 Encounter Discharge Information Items Discharge Condition: Stable Ambulatory Status: Ambulatory Discharge Destination: Home Transportation: Private Auto Accompanied By: self Schedule Follow-up Appointment: Yes Clinical Summary of Care: Patient Declined Electronic Signature(s) Signed: 10/22/2021 11:58:20 AM By: Carlene Coria RN Entered By: Carlene Coria on 10/22/2021 11:58:20 [name] ([id number]) -------------------------------------------------------------------------------- Wound Assessment Details Patient Name: [name] Date of Service: 10/22/2021 11:30 AM Medical Record Number: [id number] Patient Account Number: [id number] Date of Birth/Sex: 02-08-1946 (75 y.o. F) Treating RN: Carlene Coria Primary Care Tajai Ihde: [name] Other Clinician: Referring Derian Dimalanta: [name] Treating Niranjan Rufener/Extender: Skipper Cliche in Treatment: 39 Wound Status Wound Number: 3 Primary Lymphedema Etiology: Wound Location: Left Lower Leg Wound Open Wounding Event: Gradually Appeared Status: Date  Acquired: 08/15/2021 Comorbid Cataracts, Lymphedema, Sleep Apnea, Congestive Heart Weeks Of Treatment: 7 History: Failure, Hypertension, Osteoarthritis, Neuropathy, Clustered Wound: No Received Chemotherapy, Received Radiation Wound Measurements Length: (cm) 5 Width: (cm) 6 Depth: (cm) 0.1 Area: (cm) 23.562 Volume: (cm) 2.356 % Reduction in Area: 60.8% % Reduction in Volume: 60.8% Epithelialization: None Tunneling: No Undermining: No Wound Description Classification: Full Thickness Without Exposed Support Structu Exudate Amount: Medium Exudate Type: Serosanguineous Exudate Color: red, brown res Foul Odor After Cleansing: No Slough/Fibrino Yes Wound Bed Granulation Amount: Large (67-100%) Exposed Structure Granulation Quality: Red, Hyper-granulation Fascia Exposed: No Necrotic  Amount: Small (1-33%) Fat Layer (Subcutaneous Tissue) Exposed: Yes Necrotic Quality: Adherent Slough Tendon Exposed: No Muscle Exposed: No Joint Exposed: No Bone Exposed: No Treatment Notes Wound #3 (Lower Leg) Wound Laterality: Left Cleanser Soap and Water Discharge Instruction: Gently cleanse wound with antibacterial soap, rinse and pat dry prior to dressing wounds Wound Cleanser Discharge Instruction: Wash your hands with soap and water. Remove old dressing, discard into plastic bag and place into trash. Cleanse the wound with Wound Cleanser prior to applying a clean dressing using gauze sponges, not tissues or cotton balls. Do not scrub or use excessive force. Pat dry using gauze sponges, not tissue or cotton balls. Peri-Wound Care Topical Primary Dressing Hydrofera Blue Ready Transfer Foam, 4x5 (in/in) Discharge Instruction: Apply Hydrofera Blue Ready to wound bed as directed Secondary Dressing ABD Pad 5x9 (in/in) Discharge Instruction: Cover with ABD pad [name], [name] ([id number]) Secured With Compression Wrap Profore Lite LF 3 Multilayer Compression Bandaging System Discharge  Instruction: Apply 3 multi-layer wrap as prescribed. Compression Stockings Add-Ons Electronic Signature(s) Signed: 10/22/2021 11:56:47 AM By: Carlene Coria RN Entered By: Carlene Coria on 10/22/2021 11:56:46

## 2021-10-22 NOTE — Progress Notes (Signed)
[name], [name] ([id number]) Visit Report for 10/22/2021 Physician Orders Details Patient Name: [name], [name] Date of Service: 10/22/2021 11:30 AM Medical Record Number: [id number] Patient Account Number: [id number] Date of Birth/Sex: May 19, 1946 (76 y.o. F) Treating RN: Carlene Coria Primary Care Provider: [name] Other Clinician: Referring Provider: [name] Treating Provider/Extender: Skipper Cliche in Treatment: 25 Verbal / Phone Orders: No Diagnosis Coding Follow-up Appointments o Return Appointment in 1 week. o Nurse Visit as needed Bathing/ Shower/ Hygiene o May shower with wound dressing protected with water repellent cover or cast protector. o No tub bath. Anesthetic (Use 'Patient Medications' Section for Anesthetic Order Entry) o Lidocaine applied to wound bed Edema Control - Lymphedema / Segmental Compressive Device / Other Left Lower Extremity o Optional: One layer of unna paste to top of compression wrap (to act as an anchor). o Patient to wear own compression stockings. Remove compression stockings every night before going to bed and put on every morning when getting up. - on RIGHT LEG call [employer] for compression sock 15-22 mm/Hg, measurements and phone number on blue sheet [employer] on [employer] in [place] also has the compression socks o Elevate, Exercise Daily and Avoid Standing for Long Periods of Time. o Elevate legs to the level of the heart and pump ankles as often as possible o Elevate leg(s) parallel to the floor when sitting. o Compression Pump: Use compression pump on left lower extremity for 60 minutes, twice daily. - You may pump over wraps o Compression Pump: Use compression pump on right lower extremity for 60 minutes, twice daily. - You may pump over wraps Additional Orders / Instructions o Follow Nutritious Diet and Increase Protein Intake Wound Treatment Wound #3 - Lower Leg Wound  Laterality: Left Cleanser: Soap and Water 1 x Per Week/30 Days Discharge Instructions: Gently cleanse wound with antibacterial soap, rinse and pat dry prior to dressing wounds Cleanser: Wound Cleanser 1 x Per Week/30 Days Discharge Instructions: Wash your hands with soap and water. Remove old dressing, discard into plastic bag and place into trash. Cleanse the wound with Wound Cleanser prior to applying a clean dressing using gauze sponges, not tissues or cotton balls. Do not scrub or use excessive force. Pat dry using gauze sponges, not tissue or cotton balls. Primary Dressing: Hydrofera Blue Ready Transfer Foam, 4x5 (in/in) 1 x Per Week/30 Days Discharge Instructions: Apply Hydrofera Blue Ready to wound bed as directed Secondary Dressing: ABD Pad 5x9 (in/in) 1 x Per Week/30 Days Discharge Instructions: Cover with ABD pad Compression Wrap: Profore Lite LF 3 Multilayer Compression Bandaging System 1 x Per Week/30 Days Discharge Instructions: Apply 3 multi-layer wrap as prescribed. Electronic Signature(s) Signed: 10/22/2021 11:57:46 AM By: Carlene Coria RN Signed: 10/22/2021 2:51:33 PM By: Worthy Keeler PA-C Entered By: Carlene Coria on 10/22/2021 11:57:45 [name], [name] ([id number]) [name], [name] ([id number]) -------------------------------------------------------------------------------- SuperBill Details Patient Name: [name] Date of Service: 10/22/2021 Medical Record Number: [id number] Patient Account Number: [id number] Date of Birth/Sex: 1946-01-18 (75 y.o. 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

## 2021-10-29 ENCOUNTER — Encounter: Payer: Medicare Other | Admitting: Physician Assistant

## 2021-10-29 ENCOUNTER — Other Ambulatory Visit: Payer: Self-pay

## 2021-10-29 DIAGNOSIS — I890 Lymphedema, not elsewhere classified: Secondary | ICD-10-CM | POA: Diagnosis not present

## 2021-10-29 NOTE — Progress Notes (Addendum)
[name], [name] ([id number]) Visit Report for 10/29/2021 Chief Complaint Document Details Patient Name: [name], [name] Date of Service: 10/29/2021 2:45 PM Medical Record Number: [id number] Patient Account Number: [id number] Date of Birth/Sex: 03/17/1946 (76 y.o. F) Treating RN: Carlene Coria Primary Care Provider: [name] Other Clinician: Referring Provider: [name] Treating Provider/Extender: Skipper Cliche in Treatment: 40 Information Obtained from: Patient Chief Complaint Left LE Ulcer Electronic Signature(s) Signed: 10/29/2021 2:57:37 PM By: Worthy Keeler PA-C Entered By: Worthy Keeler on 10/29/2021 14:57:36 [name] ([id number]) -------------------------------------------------------------------------------- Debridement Details Patient Name: [name] Date of Service: 10/29/2021 2:45 PM Medical Record Number: [id number] Patient Account Number: [id number] Date of Birth/Sex: 1946-01-24 (76 y.o. F) Treating RN: Carlene Coria Primary Care Provider: [name] Other Clinician: Referring Provider: [name] Treating Provider/Extender: Skipper Cliche in Treatment: 40 Debridement Performed for Wound #4 Right,Lateral Lower Leg Assessment: Performed By: Physician Tommie Sams., PA-C Debridement Type: Chemical/Enzymatic/Mechanical Agent Used: Severity of Tissue Pre Debridement: Fat layer exposed Level of Consciousness (Pre- Awake and Alert procedure): Pre-procedure Verification/Time Out Yes - 15:15 Taken: Start Time: 15:15 Instrument: Other : gauze and saline Bleeding: Minimum Hemostasis Achieved: Pressure End Time: 15:19 Procedural Pain: 0 Post Procedural Pain: 0 Response to Treatment: Procedure was tolerated well Level of Consciousness (Post- Awake and Alert procedure): Post Debridement Measurements of Total Wound Length: (cm) 1 Width: (cm) 1.5 Depth: (cm) 0.1 Volume: (cm) 0.118 Character of Wound/Ulcer Post Debridement:  Improved Severity of Tissue Post Debridement: Fat layer exposed Post Procedure Diagnosis Same as Pre-procedure Electronic Signature(s) Signed: 10/29/2021 4:50:19 PM By: Worthy Keeler PA-C Signed: 10/30/2021 3:39:51 PM By: Carlene Coria RN Entered By: Carlene Coria on 10/29/2021 15:19:46 [name] ([id number]) -------------------------------------------------------------------------------- HPI Details Patient Name: [name] Date of Service: 10/29/2021 2:45 PM Medical Record Number: [id number] Patient Account Number: [id number] Date of Birth/Sex: Feb 04, 1946 (76 y.o. F) Treating RN: Carlene Coria Primary Care Provider: [name] Other Clinician: Referring Provider: [name] Treating Provider/Extender: Skipper Cliche in Treatment: 40 History of Present Illness HPI Description: 03/15/2020 upon evaluation today patient presents today for initial evaluation here in our clinic concerning a wound that is on the left posterior lower extremity. Unfortunately this has been giving the patient some discomfort at this point which she notes has been affected her pretty much daily. The patient does have a history of venous insufficiency, hypertension, congestive heart failure, and a history of breast cancer. Fortunately there does not appear to be evidence of active infection at this time which is great news. No fevers, chills, nausea, vomiting, or diarrhea. Wound is not extremely large but does have some slough covering the surface of the wound. This is going require sharp debridement today. 03/15/2020 upon evaluation today patient appears to be doing fairly well in regard to her wound. She does have some slough noted on the surface of the wound currently but I do believe the Iodoflex has been beneficial over the past week. There is no signs of active infection at this time which is great news and overall very pleased with the progress. No fevers, chills, nausea, vomiting, or  diarrhea. 04/02/2020 upon evaluation today patient appears to be doing a little better in regard to her wound size wise this is not tremendously smaller she does have some slough buildup on the surface of the wound. With that being said this is going require some sharp debridement today to clear away some of the necrotic debris. Fortunately there is no evidence of active infection at this  time. No fevers, chills, nausea, vomiting, or diarrhea. 04/10/2020 on evaluation today patient appears to be doing well with regard to her ulcer which is measuring somewhat smaller today. She actually has more granulation tissue noted which is also good news is no need for sharp debridement today. Fortunately there is no evidence of infection either which is also excellent. No fevers, chills, nausea, vomiting, or diarrhea. 04/26/2020 on evaluation today patient's wound actually is showing signs of good improvement which is great news. There does not appear to be any evidence of active infection. I do believe she is tolerating the collagen at this point. 05/03/2020 upon evaluation today patient's wound actually showed signs of good granulation at this time there does not appear to be any evidence of active infection which is great news and overall very pleased with where things stand. With that being said I do believe that the patient is can require some sharp debridement today but fortunately nothing too significant. 05/11/20 on evaluation today patient appears to be doing well in regard to her leg ulcer. She's been tolerating the dressing changes without complication. Fortunately there is no signs of active infection at this time. Overall I'm very pleased with where things stand. 05/18/2020 upon evaluation today patient's wound is showing signs of improvement is very dry and I think the collagen for the most part just dried to the wound bed. I am going to clean this away with sharp debridement in order to get this under  control in my opinion. 05/25/2020 upon evaluation today patient actually appears to be doing well in regard to her leg ulcer. In fact upon inspection it appears she could potentially be completely healed but that is not guaranteed based on what we are seeing. There does not appear to be any signs of active infection which is great news. 05/31/2020 on evaluation today patient appears to be doing well with regard to her wounds. She in fact appears to be almost completely healed on the left leg there is just a very small opening remaining point 1 06/08/2020 upon evaluation today patient appears to be doing excellent in regard to her leg ulcer on the left in fact it appears to be that she is completely healed as of today. I see no signs of active infection at this time which is great news. No fevers, chills, nausea, vomiting, or diarrhea. READMISSION 01/16/2021 Patient that we have had in this clinic with a wound on the left posterior calf in the summer 2021 extending into October. She was felt to have chronic venous insufficiency. Per the patient's description she was discharged with what sounds like external compression stockings although she could not afford the "$75 per leg". She therefore did not get anything. Apparently her wound that she has currently is been in existence since January. She has been followed at vein and vascular with Unna boots and I think calcium alginate. She had ABIs done on 06/25/2020 that showed an noncompressible ABI on the right leg and the left leg but triphasic waveforms with good great toe pressures and a TBI of 0.90 on the right and 0.91 on the left Surprisingly the patient also had venous reflux studies that were really unremarkable. This included no evidence of DVTs or SVTs but there was no evidence of deep venous insufficiency or superficial venous insufficiency in the greater saphenous or short saphenous veins bilaterally. I would wonder about more central venous  issues or perhaps this is all lymphedema 01/25/2021 upon evaluation today patient appears  to be doing decently well and guard to her wound. The good news is the Iodoflex that is job she saw Dr. Dellia Nims last week for readmission and to be honest I think that this has done extremely well over that week. I do believe that she can tolerate the sharp debridement today which will be good. I think that cleaning off the wound will likely allow [place] to be able to use a different type of dressing I do not think the Iodoflex will even be necessary based on how clean the wound looks. Fortunately there is no sign of active infection at this time which is great news. No fevers, chills, nausea, vomiting, or diarrhea. 02/12/2021 upon evaluation today patient appears to be doing well with regard to her leg ulcer. We did switch to alginate when she came for nurse visit I think is doing much better for her. Fortunately there does not appear to be any signs of active infection at this time which is great news. No fevers, chills, nausea, vomiting, or diarrhea. CLOIE, WOODEN ([id number]) 02/18/2021 upon evaluation today patient's wound is actually showing signs of good improvement. I am very pleased with where things stand currently. I do not see any signs of active infection which is great news and overall I feel like she is making good progress. The patient likewise is happy that things are doing so well and that this is measuring somewhat smaller. 02/25/2021 upon evaluation today patient actually appears to be doing quite well in regard to her wounds. She has been tolerating the dressing changes without complication. Fortunately there does not appear to be any signs of active infection which is great news and overall I am extremely pleased with where things stand. No fevers, chills, nausea, vomiting, or diarrhea. She does have a lot of edema I really do think she needs compression socks to be worn in order to prevent things  from causing additional issues for her currently. Especially on the right leg she should be wearing compression socks all the time to be honest. 03/04/2021 upon evaluation today patient's leg ulcer actually appears to be doing pretty well which is great news. There does not appear to be any significant change but overall the appearance is better even though the size is not necessarily reflecting a great improvement. Fortunately there does not appear to be any signs of active infection. No fevers, chills, nausea, vomiting, or diarrhea. 03/12/2021 upon evaluation today patient appears to be doing well with regard to her wounds. She has been tolerating the dressing changes without complication. The good news is that the wound on her left lateral leg is doing much better and is showing signs of good improvement. Overall her swelling is controlled well as well. She does need some compression socks she plans to go Jefferey socks next week. 03/18/2021 on evaluation today patient's wound actually is showing signs of good improvement. She does have a little bit of slough this can require some sharp debridement today. 03/25/2021 upon evaluation today patient appears to be doing well with regard to her wound on the left lateral leg. She has been tolerating the dressing changes without complication. Fortunately there does not appear to be any signs of active infection at this time. No fever chills no 04/01/2021 upon evaluation today patient's wound is showing signs of improvement and overall very pleased with where things stand at this point. There is no evidence of active infection which is great news as well and in general I think that  she is making great progress. 04/09/2021 upon evaluation today patient appears to be doing well with regard to her ankle ulcer. She is making good progress currently which is great news. There does not appear to be any signs of infection also excellent news. In general I am extremely  pleased with where things stand today. No fevers, chills, nausea, vomiting, or diarrhea. 04/23/2021 upon inspection today patient appears to be doing quite well with regard to her leg ulcer. She has been tolerating the dressing changes without complication. She is can require some sharp debridement today but overall seems to be doing excellent. 05/06/2021 upon evaluation today patient unfortunately appears to be doing poorly overall in regard to her swelling. She is actually significantly more swollen than last week despite the fact that her wound is doing better this is not a good trend. I think that she probably needs to see her cardiologist ASAP and I discussed that with her today. This includes the fact that honestly I think she probably is getting need an increase in her diuretic or something to try to clear away some of the excess fluid that she is experiencing at the moment. She is not been doing her pumps even twice a day but again right now more concerned that if she were to do the pump she would fluid overload her lungs causing other issues as far as her breathing is concerned. Obviously I think she needs to contact them and move up the appointment for the 12th of something sooner 2 weeks is a bit too long to wait with how swollen she has. 05/14/2021 upon evaluation today patient's wound actually showing signs of being a little bit smaller compared to previous. She has been making good progress however. Fortunately there does not appear to be any evidence of active infection which is great. Overall I am extremely pleased in that regard. Nonetheless I am still concerned that she is having quite a bit of issue as far as the swelling is concerned she has been placed on fluid restriction as well as an increase in the furosemide by her doctor. We will see how things progress. 05/21/2021 upon evaluation today patient appears to be doing well with regard to her wound in general. She has been tolerating  the dressing changes without complication. With that being said she has been using silver cell for a while and while this was showing signs of improvement it is now gotten to the point where this has slowed down quite significantly. I do believe that switching to a different dressing may be beneficial for her today. 05/28/2021 upon evaluation today patient actually appears to be doing quite well in regard to her wound. In fact there is really not any need for sharp debridement today based on what I see. The Hydrofera Blue is doing great and overall I think that she is managing quite nicely with the compression wrapping. Her edema is still down quite a bit with the wrapping in place. 06/11/2021 upon evaluation today patient appears to be doing well with regard to her leg ulcer. She has been tolerating the dressing changes without complication. Fortunately there does not appear to be any signs of active infection at this time which is great news. No fevers, chills, nausea, vomiting, or diarrhea. She is going require some sharp debridement today. 10/11; left leg ulcer much better looking and with improved surface area. She is using Hydrofera Blue under 3 layer compression. She does not have any stocking on the right  leg which in itself has very significant nonpitting edema 06/25/2021 upon evaluation today patient appears to be doing well with regard to the wound on her left leg. She has been tolerating the dressing changes without complication and overall I am extremely pleased with where things stand today. I do not see any evidence of infection which is great news. 07/02/2021 upon evaluation today patient's wound actually showing signs of excellent improvement and actually very pleased with where we stand today and the overall appearance. Fortunately there does not appear to be any signs of active infection. No fevers, chills, nausea, vomiting, or diarrhea. 07/09/2021 upon evaluation today patient  appears to be doing better in regard to her wound this is measuring smaller and she is headed in the appropriate direction based on what I am seeing currently. I do not see any evidence of active infection systemically which is great news. 07/16/2021 upon evaluation today patient appears to be doing pretty well currently in regard to her leg ulcer. This is measuring smaller and looking much better this is great news. Fortunately there does not appear to be any evidence of active infection systemically which is great news as well. 07/23/2021 upon evaluation today patient's wound is actually showing signs of good improvement this is definitely measuring smaller. I do not see any signs of infection which is great news and overall very pleased in that regard. Overall I think that she is doing well with the Anjanae Woehrle, Kenney ([id number]) [place]. 07/30/2020 upon evaluation today patient appears to be doing well in regard to her wound. She has been tolerating the dressing changes without complication think the Hydrofera Blue at this point is actually causing her to dry out a lot as far as the wound bed is concerned. Subsequently I think that we need to try to see what we can do to improve this. Fortunately there does not appear to be any evidence of active infection at this time which is great news. 08/06/2021 upon evaluation today patient appears to be doing well with regards to her wound. Is not measuring significantly smaller initial inspection but upon closer inspection she has a lot of new skin growth across the central portion of the wound. I think will get very close to complete resolution. 08/13/2021 upon evaluation today patient appears to be doing well with regard to her wound. In fact this appears to be I believe healed although there is still some question as to whether it is completely so. I am concerned about the fact that to be honest she still has some evidence of dry skin that could  be just trapping something underneath I still want to debride anything as I am afraid of causing some damage to the good skin for that reason I Georgina Peer probably monitor for 1 more week before completely closing everything out. 08/20/2021 upon evaluation today patient actually appears to be doing excellent. In fact she appears to be completely healed. This was the case last week as well we just wanted to make sure nothing reopened since she does not really have an ability to put on compression socks this is good to be something we need to make sure was well-healed. Nonetheless I think we have achieved that goal as of today. 12/27; this is a patient we discharged 2 weeks ago with wounds on her left lower leg laterally. She was supposed to get stockings and really did not get them. She developed blistering and reopening probably sometime late last week. She has 3  areas in the same area as previously. The mid calf dimensions on the left went from 39 to 51 cm today. She also has very significant edema on the right leg but as far as I am aware has not had wounds in this area 1/4; patient presents for follow-up. She has tolerated the compression wrap well. She has no issues or complaints today. 09/17/2021 upon evaluation today patient appears to be doing well with regard to her wounds. She has been tolerating the dressing changes without complication. Fortunately there does not appear to be any signs of active infection at this time. No fevers, chills, nausea, vomiting, or diarrhea. 10/01/2021 upon evaluation today patient appears to be doing well with regard to her wound this is actually measuring better and looking smaller and I am very pleased in that regard. Fortunately I do not see any evidence of active infection locally nor systemically at this point. No fevers, chills, nausea, vomiting, or diarrhea. 10/08/2021 upon evaluation patient's wounds are actually showing some signs of improvement measuring a  little bit smaller and looking a little bit better. Overall I do not think there is any need for sharp debridement today which is good news. No fevers, chills, nausea, vomiting, or diarrhea. 10/14/2021 upon evaluation today patient appears to be doing well with regard to her wound. She has been tolerating the dressing changes without complication and overall I am extremely pleased with where things stand today. There is a little bit of film on the surface of the wounds but this was carefully cleaned away with just saline and gauze she really did not want me to do any sharp debridement today. 10/29/2021 upon evaluation today patient appears to be doing quite well in regard to her wounds. She is actually showing signs of excellent improvement which is great news and overall I feel like we are on the right track at this time. She does have a wound on both legs and she does need something compression wise when she heals for that reason we will get a go and see about ordering her bilateral juxta lite compression wraps. Electronic Signature(s) Signed: 10/29/2021 4:18:24 PM By: Worthy Keeler PA-C Entered By: Worthy Keeler on 10/29/2021 16:18:24 ALIVIA, CIMINO ([id number]) -------------------------------------------------------------------------------- Physical Exam Details Patient Name: [name] Date of Service: 10/29/2021 2:45 PM Medical Record Number: [id number] Patient Account Number: [id number] Date of Birth/Sex: 02-18-1946 (76 y.o. F) Treating RN: Carlene Coria Primary Care Provider: [name] Other Clinician: Referring Provider: [name] Treating Provider/Extender: Skipper Cliche in Treatment: 76 Constitutional Obese and well-hydrated in no acute distress. Respiratory normal breathing without difficulty. Psychiatric this patient is able to make decisions and demonstrates good insight into disease process. Alert and Oriented x 3. pleasant and cooperative. Notes Upon  inspection patient's wound bed actually showed signs of good granulation and epithelization at this point. Fortunately I do not see any evidence of active infection locally or systemically which is great and overall I feel like we are on the right track in that regard. Nonetheless I do believe that she would benefit from the silver alginate dressing at this time. Electronic Signature(s) Signed: 10/29/2021 4:18:42 PM By: Worthy Keeler PA-C Entered By: Worthy Keeler on 10/29/2021 16:18:42 [name] ([id number]) -------------------------------------------------------------------------------- Physician Orders Details Patient Name: [name] Date of Service: 10/29/2021 2:45 PM Medical Record Number: [id number] Patient Account Number: [id number] Date of Birth/Sex: February 06, 1946 (76 y.o. F) Treating RN: Carlene Coria Primary Care Provider: [name] Other Clinician: Referring Provider: Clide Deutscher,  Ngwe Treating Provider/Extender: Skipper Cliche in Treatment: 66 Verbal / Phone Orders: No Diagnosis Coding ICD-10 Coding Code Description I89.0 Lymphedema, not elsewhere classified I87.332 Chronic venous hypertension (idiopathic) with ulcer and inflammation of left lower extremity L97.828 Non-pressure chronic ulcer of other part of left lower leg with other specified severity Follow-up Appointments o Return Appointment in 1 week. o Nurse Visit as needed Bathing/ Shower/ Hygiene o May shower with wound dressing protected with water repellent cover or cast protector. o No tub bath. Anesthetic (Use 'Patient Medications' Section for Anesthetic Order Entry) o Lidocaine applied to wound bed Edema Control - Lymphedema / Segmental Compressive Device / Other Left Lower Extremity o Optional: One layer of unna paste to top of compression wrap (to act as an anchor). o Elevate, Exercise Daily and Avoid Standing for Long Periods of Time. o Elevate legs to the level of the heart  and pump ankles as often as possible o Elevate leg(s) parallel to the floor when sitting. o Compression Pump: Use compression pump on left lower extremity for 60 minutes, twice daily. - You may pump over wraps o Compression Pump: Use compression pump on right lower extremity for 60 minutes, twice daily. - You may pump over wraps Additional Orders / Instructions o Follow Nutritious Diet and Increase Protein Intake Wound Treatment Wound #3 - Lower Leg Wound Laterality: Left Cleanser: Soap and Water 1 x Per Week/30 Days Discharge Instructions: Gently cleanse wound with antibacterial soap, rinse and pat dry prior to dressing wounds Cleanser: Wound Cleanser 1 x Per Week/30 Days Discharge Instructions: Wash your hands with soap and water. Remove old dressing, discard into plastic bag and place into trash. Cleanse the wound with Wound Cleanser prior to applying a clean dressing using gauze sponges, not tissues or cotton balls. Do not scrub or use excessive force. Pat dry using gauze sponges, not tissue or cotton balls. Primary Dressing: Hydrofera Blue Ready Transfer Foam, 4x5 (in/in) 1 x Per Week/30 Days Discharge Instructions: Apply Hydrofera Blue Ready to wound bed as directed Secondary Dressing: ABD Pad 5x9 (in/in) 1 x Per Week/30 Days Discharge Instructions: Cover with ABD pad Compression Wrap: Profore Lite LF 3 Multilayer Compression Bandaging System 1 x Per Week/30 Days Discharge Instructions: Apply 3 multi-layer wrap as prescribed. Compression Stockings: Circaid Juxta Lite Compression Wrap (DME) Left Leg Compression Amount: 30-40 mmHG Discharge Instructions: Apply Circaid Juxta Lite Compression Wrap as directed Wound #4 - Lower Leg Wound Laterality: Right, Lateral Cleanser: Soap and Water 1 x Per Week/30 Days GREDMARIE, DELANGE ([id number]) Discharge Instructions: Gently cleanse wound with antibacterial soap, rinse and pat dry prior to dressing wounds Cleanser: Wound Cleanser 1 x Per  Week/30 Days Discharge Instructions: Wash your hands with soap and water. Remove old dressing, discard into plastic bag and place into trash. Cleanse the wound with Wound Cleanser prior to applying a clean dressing using gauze sponges, not tissues or cotton balls. Do not scrub or use excessive force. Pat dry using gauze sponges, not tissue or cotton balls. Primary Dressing: Hydrofera Blue Ready Transfer Foam, 4x5 (in/in) 1 x Per Week/30 Days Discharge Instructions: Apply Hydrofera Blue Ready to wound bed as directed Secondary Dressing: ABD Pad 5x9 (in/in) 1 x Per Week/30 Days Discharge Instructions: Cover with ABD pad Compression Wrap: Profore Lite LF 3 Multilayer Compression Bandaging System 1 x Per Week/30 Days Discharge Instructions: Apply 3 multi-layer wrap as prescribed. Compression Stockings: Circaid Juxta Lite Compression Wrap Right Leg Compression Amount: 20-30 mmHG Discharge Instructions: Apply Circaid Juxta Lite  Compression Wrap as directed Electronic Signature(s) Signed: 10/30/2021 3:39:51 PM By: Carlene Coria RN Signed: 11/01/2021 4:05:24 PM By: Worthy Keeler PA-C Previous Signature: 10/29/2021 4:50:19 PM Version By: Worthy Keeler PA-C Entered By: Carlene Coria on 10/30/2021 11:56:43 [name] ([id number]) -------------------------------------------------------------------------------- Problem List Details Patient Name: [name] Date of Service: 10/29/2021 2:45 PM Medical Record Number: [id number] Patient Account Number: [id number] Date of Birth/Sex: 05/31/1946 (76 y.o. F) Treating RN: Carlene Coria Primary Care Provider: [name] Other Clinician: Referring Provider: [name] Treating Provider/Extender: Skipper Cliche in Treatment: 40 Active Problems ICD-10 Encounter Code Description Active Date MDM Diagnosis I89.0 Lymphedema, not elsewhere classified 01/16/2021 No Yes I87.332 Chronic venous hypertension (idiopathic) with ulcer and inflammation of  01/16/2021 No Yes left lower extremity L97.828 Non-pressure chronic ulcer of other part of left lower leg with other 01/16/2021 No Yes specified severity Inactive Problems Resolved Problems Electronic Signature(s) Signed: 10/29/2021 2:57:32 PM By: Worthy Keeler PA-C Entered By: Worthy Keeler on 10/29/2021 14:57:31 [name] ([id number]) -------------------------------------------------------------------------------- Progress Note Details Patient Name: [name] Date of Service: 10/29/2021 2:45 PM Medical Record Number: [id number] Patient Account Number: [id number] Date of Birth/Sex: 10-02-1945 (76 y.o. F) Treating RN: Carlene Coria Primary Care Provider: [name] Other Clinician: Referring Provider: [name] Treating Provider/Extender: Skipper Cliche in Treatment: 40 Subjective Chief Complaint Information obtained from Patient Left LE Ulcer History of Present Illness (HPI) 03/15/2020 upon evaluation today patient presents today for initial evaluation here in our clinic concerning a wound that is on the left posterior lower extremity. Unfortunately this has been giving the patient some discomfort at this point which she notes has been affected her pretty much daily. The patient does have a history of venous insufficiency, hypertension, congestive heart failure, and a history of breast cancer. Fortunately there does not appear to be evidence of active infection at this time which is great news. No fevers, chills, nausea, vomiting, or diarrhea. Wound is not extremely large but does have some slough covering the surface of the wound. This is going require sharp debridement today. 03/15/2020 upon evaluation today patient appears to be doing fairly well in regard to her wound. She does have some slough noted on the surface of the wound currently but I do believe the Iodoflex has been beneficial over the past week. There is no signs of active infection at this time  which is great news and overall very pleased with the progress. No fevers, chills, nausea, vomiting, or diarrhea. 04/02/2020 upon evaluation today patient appears to be doing a little better in regard to her wound size wise this is not tremendously smaller she does have some slough buildup on the surface of the wound. With that being said this is going require some sharp debridement today to clear away some of the necrotic debris. Fortunately there is no evidence of active infection at this time. No fevers, chills, nausea, vomiting, or diarrhea. 04/10/2020 on evaluation today patient appears to be doing well with regard to her ulcer which is measuring somewhat smaller today. She actually has more granulation tissue noted which is also good news is no need for sharp debridement today. Fortunately there is no evidence of infection either which is also excellent. No fevers, chills, nausea, vomiting, or diarrhea. 04/26/2020 on evaluation today patient's wound actually is showing signs of good improvement which is great news. There does not appear to be any evidence of active infection. I do believe she is tolerating the collagen at this point. 05/03/2020  upon evaluation today patient's wound actually showed signs of good granulation at this time there does not appear to be any evidence of active infection which is great news and overall very pleased with where things stand. With that being said I do believe that the patient is can require some sharp debridement today but fortunately nothing too significant. 05/11/20 on evaluation today patient appears to be doing well in regard to her leg ulcer. She's been tolerating the dressing changes without complication. Fortunately there is no signs of active infection at this time. Overall I'm very pleased with where things stand. 05/18/2020 upon evaluation today patient's wound is showing signs of improvement is very dry and I think the collagen for the most part  just dried to the wound bed. I am going to clean this away with sharp debridement in order to get this under control in my opinion. 05/25/2020 upon evaluation today patient actually appears to be doing well in regard to her leg ulcer. In fact upon inspection it appears she could potentially be completely healed but that is not guaranteed based on what we are seeing. There does not appear to be any signs of active infection which is great news. 05/31/2020 on evaluation today patient appears to be doing well with regard to her wounds. She in fact appears to be almost completely healed on the left leg there is just a very small opening remaining point 1 06/08/2020 upon evaluation today patient appears to be doing excellent in regard to her leg ulcer on the left in fact it appears to be that she is completely healed as of today. I see no signs of active infection at this time which is great news. No fevers, chills, nausea, vomiting, or diarrhea. READMISSION 01/16/2021 Patient that we have had in this clinic with a wound on the left posterior calf in the summer 2021 extending into October. She was felt to have chronic venous insufficiency. Per the patient's description she was discharged with what sounds like external compression stockings although she could not afford the "$75 per leg". She therefore did not get anything. Apparently her wound that she has currently is been in existence since January. She has been followed at vein and vascular with Unna boots and I think calcium alginate. She had ABIs done on 06/25/2020 that showed an noncompressible ABI on the right leg and the left leg but triphasic waveforms with good great toe pressures and a TBI of 0.90 on the right and 0.91 on the left Surprisingly the patient also had venous reflux studies that were really unremarkable. This included no evidence of DVTs or SVTs but there was no evidence of deep venous insufficiency or superficial venous insufficiency  in the greater saphenous or short saphenous veins bilaterally. I would wonder about more central venous issues or perhaps this is all lymphedema 01/25/2021 upon evaluation today patient appears to be doing decently well and guard to her wound. The good news is the Iodoflex that is job she saw Dr. Dellia Nims last week for readmission and to be honest I think that this has done extremely well over that week. I do believe that she can tolerate the sharp debridement today which will be good. I think that cleaning off the wound will likely allow [place] to be able to use a different type of dressing I do not think the Iodoflex will even be necessary based on how clean the wound looks. Fortunately there is no sign of active infection at this time which  is great news. No fevers, chills, nausea, vomiting, or diarrhea. JACARI, KIRSTEN ([id number]) 02/12/2021 upon evaluation today patient appears to be doing well with regard to her leg ulcer. We did switch to alginate when she came for nurse visit I think is doing much better for her. Fortunately there does not appear to be any signs of active infection at this time which is great news. No fevers, chills, nausea, vomiting, or diarrhea. 02/18/2021 upon evaluation today patient's wound is actually showing signs of good improvement. I am very pleased with where things stand currently. I do not see any signs of active infection which is great news and overall I feel like she is making good progress. The patient likewise is happy that things are doing so well and that this is measuring somewhat smaller. 02/25/2021 upon evaluation today patient actually appears to be doing quite well in regard to her wounds. She has been tolerating the dressing changes without complication. Fortunately there does not appear to be any signs of active infection which is great news and overall I am extremely pleased with where things stand. No fevers, chills, nausea, vomiting, or diarrhea. She does  have a lot of edema I really do think she needs compression socks to be worn in order to prevent things from causing additional issues for her currently. Especially on the right leg she should be wearing compression socks all the time to be honest. 03/04/2021 upon evaluation today patient's leg ulcer actually appears to be doing pretty well which is great news. There does not appear to be any significant change but overall the appearance is better even though the size is not necessarily reflecting a great improvement. Fortunately there does not appear to be any signs of active infection. No fevers, chills, nausea, vomiting, or diarrhea. 03/12/2021 upon evaluation today patient appears to be doing well with regard to her wounds. She has been tolerating the dressing changes without complication. The good news is that the wound on her left lateral leg is doing much better and is showing signs of good improvement. Overall her swelling is controlled well as well. She does need some compression socks she plans to go Jefferey socks next week. 03/18/2021 on evaluation today patient's wound actually is showing signs of good improvement. She does have a little bit of slough this can require some sharp debridement today. 03/25/2021 upon evaluation today patient appears to be doing well with regard to her wound on the left lateral leg. She has been tolerating the dressing changes without complication. Fortunately there does not appear to be any signs of active infection at this time. No fever chills no 04/01/2021 upon evaluation today patient's wound is showing signs of improvement and overall very pleased with where things stand at this point. There is no evidence of active infection which is great news as well and in general I think that she is making great progress. 04/09/2021 upon evaluation today patient appears to be doing well with regard to her ankle ulcer. She is making good progress currently which is great  news. There does not appear to be any signs of infection also excellent news. In general I am extremely pleased with where things stand today. No fevers, chills, nausea, vomiting, or diarrhea. 04/23/2021 upon inspection today patient appears to be doing quite well with regard to her leg ulcer. She has been tolerating the dressing changes without complication. She is can require some sharp debridement today but overall seems to be doing excellent. 05/06/2021 upon evaluation  today patient unfortunately appears to be doing poorly overall in regard to her swelling. She is actually significantly more swollen than last week despite the fact that her wound is doing better this is not a good trend. I think that she probably needs to see her cardiologist ASAP and I discussed that with her today. This includes the fact that honestly I think she probably is getting need an increase in her diuretic or something to try to clear away some of the excess fluid that she is experiencing at the moment. She is not been doing her pumps even twice a day but again right now more concerned that if she were to do the pump she would fluid overload her lungs causing other issues as far as her breathing is concerned. Obviously I think she needs to contact them and move up the appointment for the 12th of something sooner 2 weeks is a bit too long to wait with how swollen she has. 05/14/2021 upon evaluation today patient's wound actually showing signs of being a little bit smaller compared to previous. She has been making good progress however. Fortunately there does not appear to be any evidence of active infection which is great. Overall I am extremely pleased in that regard. Nonetheless I am still concerned that she is having quite a bit of issue as far as the swelling is concerned she has been placed on fluid restriction as well as an increase in the furosemide by her doctor. We will see how things progress. 05/21/2021 upon  evaluation today patient appears to be doing well with regard to her wound in general. She has been tolerating the dressing changes without complication. With that being said she has been using silver cell for a while and while this was showing signs of improvement it is now gotten to the point where this has slowed down quite significantly. I do believe that switching to a different dressing may be beneficial for her today. 05/28/2021 upon evaluation today patient actually appears to be doing quite well in regard to her wound. In fact there is really not any need for sharp debridement today based on what I see. The Hydrofera Blue is doing great and overall I think that she is managing quite nicely with the compression wrapping. Her edema is still down quite a bit with the wrapping in place. 06/11/2021 upon evaluation today patient appears to be doing well with regard to her leg ulcer. She has been tolerating the dressing changes without complication. Fortunately there does not appear to be any signs of active infection at this time which is great news. No fevers, chills, nausea, vomiting, or diarrhea. She is going require some sharp debridement today. 10/11; left leg ulcer much better looking and with improved surface area. She is using Hydrofera Blue under 3 layer compression. She does not have any stocking on the right leg which in itself has very significant nonpitting edema 06/25/2021 upon evaluation today patient appears to be doing well with regard to the wound on her left leg. She has been tolerating the dressing changes without complication and overall I am extremely pleased with where things stand today. I do not see any evidence of infection which is great news. 07/02/2021 upon evaluation today patient's wound actually showing signs of excellent improvement and actually very pleased with where we stand today and the overall appearance. Fortunately there does not appear to be any signs of  active infection. No fevers, chills, nausea, vomiting, or diarrhea. 07/09/2021 upon  evaluation today patient appears to be doing better in regard to her wound this is measuring smaller and she is headed in the appropriate direction based on what I am seeing currently. I do not see any evidence of active infection systemically which is great news. 07/16/2021 upon evaluation today patient appears to be doing pretty well currently in regard to her leg ulcer. This is measuring smaller and looking much better this is great news. Fortunately there does not appear to be any evidence of active infection systemically which is great news KATORA, FINI ([id number]) as well. 07/23/2021 upon evaluation today patient's wound is actually showing signs of good improvement this is definitely measuring smaller. I do not see any signs of infection which is great news and overall very pleased in that regard. Overall I think that she is doing well with the [facility]. 07/30/2020 upon evaluation today patient appears to be doing well in regard to her wound. She has been tolerating the dressing changes without complication think the Hydrofera Blue at this point is actually causing her to dry out a lot as far as the wound bed is concerned. Subsequently I think that we need to try to see what we can do to improve this. Fortunately there does not appear to be any evidence of active infection at this time which is great news. 08/06/2021 upon evaluation today patient appears to be doing well with regards to her wound. Is not measuring significantly smaller initial inspection but upon closer inspection she has a lot of new skin growth across the central portion of the wound. I think will get very close to complete resolution. 08/13/2021 upon evaluation today patient appears to be doing well with regard to her wound. In fact this appears to be I believe healed although there is still some question as to whether it is  completely so. I am concerned about the fact that to be honest she still has some evidence of dry skin that could be just trapping something underneath I still want to debride anything as I am afraid of causing some damage to the good skin for that reason I Georgina Peer probably monitor for 1 more week before completely closing everything out. 08/20/2021 upon evaluation today patient actually appears to be doing excellent. In fact she appears to be completely healed. This was the case last week as well we just wanted to make sure nothing reopened since she does not really have an ability to put on compression socks this is good to be something we need to make sure was well-healed. Nonetheless I think we have achieved that goal as of today. 12/27; this is a patient we discharged 2 weeks ago with wounds on her left lower leg laterally. She was supposed to get stockings and really did not get them. She developed blistering and reopening probably sometime late last week. She has 3 areas in the same area as previously. The mid calf dimensions on the left went from 39 to 51 cm today. She also has very significant edema on the right leg but as far as I am aware has not had wounds in this area 1/4; patient presents for follow-up. She has tolerated the compression wrap well. She has no issues or complaints today. 09/17/2021 upon evaluation today patient appears to be doing well with regard to her wounds. She has been tolerating the dressing changes without complication. Fortunately there does not appear to be any signs of active infection at this time. No fevers, chills, nausea,  vomiting, or diarrhea. 10/01/2021 upon evaluation today patient appears to be doing well with regard to her wound this is actually measuring better and looking smaller and I am very pleased in that regard. Fortunately I do not see any evidence of active infection locally nor systemically at this point. No fevers, chills, nausea, vomiting, or  diarrhea. 10/08/2021 upon evaluation patient's wounds are actually showing some signs of improvement measuring a little bit smaller and looking a little bit better. Overall I do not think there is any need for sharp debridement today which is good news. No fevers, chills, nausea, vomiting, or diarrhea. 10/14/2021 upon evaluation today patient appears to be doing well with regard to her wound. She has been tolerating the dressing changes without complication and overall I am extremely pleased with where things stand today. There is a little bit of film on the surface of the wounds but this was carefully cleaned away with just saline and gauze she really did not want me to do any sharp debridement today. 10/29/2021 upon evaluation today patient appears to be doing quite well in regard to her wounds. She is actually showing signs of excellent improvement which is great news and overall I feel like we are on the right track at this time. She does have a wound on both legs and she does need something compression wise when she heals for that reason we will get a go and see about ordering her bilateral juxta lite compression wraps. Objective Constitutional Obese and well-hydrated in no acute distress. Vitals Time Taken: 2:47 PM, Height: 69 in, Weight: 244 lbs, BMI: 36, Temperature: 98.3 F, Pulse: 65 bpm, Respiratory Rate: 18 breaths/min, Blood Pressure: 199/79 mmHg. Respiratory normal breathing without difficulty. Psychiatric this patient is able to make decisions and demonstrates good insight into disease process. Alert and Oriented x 3. pleasant and cooperative. General Notes: Upon inspection patient's wound bed actually showed signs of good granulation and epithelization at this point. Fortunately I do not see any evidence of active infection locally or systemically which is great and overall I feel like we are on the right track in that regard. TASHINA, CREDIT ([id number]) Nonetheless I do believe  that she would benefit from the silver alginate dressing at this time. Integumentary (Hair, Skin) Wound #3 status is Open. Original cause of wound was Gradually Appeared. The date acquired was: 08/15/2021. The wound has been in treatment 8 weeks. The wound is located on the Left Lower Leg. The wound measures 6cm length x 3cm width x 0.1cm depth; 14.137cm^2 area and 1.414cm^3 volume. There is Fat Layer (Subcutaneous Tissue) exposed. There is no tunneling or undermining noted. There is a medium amount of serosanguineous drainage noted. There is large (67-100%) red, hyper - granulation within the wound bed. There is a small (1-33%) amount of necrotic tissue within the wound bed including Adherent Slough. Wound #4 status is Open. Original cause of wound was Gradually Appeared. The date acquired was: 10/27/2021. The wound is located on the Right,Lateral Lower Leg. The wound measures 1cm length x 1.5cm width x 0.1cm depth; 1.178cm^2 area and 0.118cm^3 volume. There is Fat Layer (Subcutaneous Tissue) exposed. There is no tunneling or undermining noted. There is a medium amount of serosanguineous drainage noted. There is medium (34-66%) red granulation within the wound bed. There is a medium (34-66%) amount of necrotic tissue within the wound bed including Adherent Slough. Assessment Active Problems ICD-10 Lymphedema, not elsewhere classified Chronic venous hypertension (idiopathic) with ulcer and inflammation of left lower  extremity Non-pressure chronic ulcer of other part of left lower leg with other specified severity Procedures Wound #4 Pre-procedure diagnosis of Wound #4 is a Venous Leg Ulcer located on the Right,Lateral Lower Leg .Severity of Tissue Pre Debridement is: Fat layer exposed. There was a Chemical/Enzymatic/Mechanical debridement performed by Tommie Sams., PA-C. With the following instrument(s): gauze and saline. A time out was conducted at 15:15, prior to the start of the procedure. A  Minimum amount of bleeding was controlled with Pressure. The procedure was tolerated well with a pain level of 0 throughout and a pain level of 0 following the procedure. Post Debridement Measurements: 1cm length x 1.5cm width x 0.1cm depth; 0.118cm^3 volume. Character of Wound/Ulcer Post Debridement is improved. Severity of Tissue Post Debridement is: Fat layer exposed. Post procedure Diagnosis Wound #4: Same as Pre-Procedure Pre-procedure diagnosis of Wound #4 is a Venous Leg Ulcer located on the Right,Lateral Lower Leg . There was a Three Layer Compression Therapy Procedure by Carlene Coria, RN. Post procedure Diagnosis Wound #4: Same as Pre-Procedure Wound #3 Pre-procedure diagnosis of Wound #3 is a Lymphedema located on the Left Lower Leg . There was a Three Layer Compression Therapy Procedure by Carlene Coria, RN. Post procedure Diagnosis Wound #3: Same as Pre-Procedure Plan Follow-up Appointments: Return Appointment in 1 week. Nurse Visit as needed Bathing/ Shower/ Hygiene: May shower with wound dressing protected with water repellent cover or cast protector. No tub bath. Anesthetic (Use 'Patient Medications' Section for Anesthetic Order Entry): Lidocaine applied to wound bed Edema Control - Lymphedema / Segmental Compressive Device / Other: Optional: One layer of unna paste to top of compression wrap (to act as an anchor). Elevate, Exercise Daily and Avoid Standing for Long Periods of Time. Elevate legs to the level of the heart and pump ankles as often as possible Elevate leg(s) parallel to the floor when sitting. Compression Pump: Use compression pump on left lower extremity for 60 minutes, twice daily. - You may pump over wraps [place], Ryley ([id number]) Compression Pump: Use compression pump on right lower extremity for 60 minutes, twice daily. - You may pump over wraps Additional Orders / Instructions: Follow Nutritious Diet and Increase Protein Intake WOUND #3: - Lower  Leg Wound Laterality: Left Cleanser: Soap and Water 1 x Per Week/30 Days Discharge Instructions: Gently cleanse wound with antibacterial soap, rinse and pat dry prior to dressing wounds Cleanser: Wound Cleanser 1 x Per Week/30 Days Discharge Instructions: Wash your hands with soap and water. Remove old dressing, discard into plastic bag and place into trash. Cleanse the wound with Wound Cleanser prior to applying a clean dressing using gauze sponges, not tissues or cotton balls. Do not scrub or use excessive force. Pat dry using gauze sponges, not tissue or cotton balls. Primary Dressing: Hydrofera Blue Ready Transfer Foam, 4x5 (in/in) 1 x Per Week/30 Days Discharge Instructions: Apply Hydrofera Blue Ready to wound bed as directed Secondary Dressing: ABD Pad 5x9 (in/in) 1 x Per Week/30 Days Discharge Instructions: Cover with ABD pad Compression Wrap: Profore Lite LF 3 Multilayer Compression Bandaging System 1 x Per Week/30 Days Discharge Instructions: Apply 3 multi-layer wrap as prescribed. Compression Stockings: Circaid Juxta Lite Compression Wrap (DME) Compression Amount: 30-40 mmHg (left) Discharge Instructions: Apply Circaid Juxta Lite Compression Wrap as directed WOUND #4: - Lower Leg Wound Laterality: Right, Lateral Cleanser: Soap and Water 1 x Per Week/30 Days Discharge Instructions: Gently cleanse wound with antibacterial soap, rinse and pat dry prior to dressing wounds Cleanser: Wound Cleanser  1 x Per Week/30 Days Discharge Instructions: Wash your hands with soap and water. Remove old dressing, discard into plastic bag and place into trash. Cleanse the wound with Wound Cleanser prior to applying a clean dressing using gauze sponges, not tissues or cotton balls. Do not scrub or use excessive force. Pat dry using gauze sponges, not tissue or cotton balls. Primary Dressing: Hydrofera Blue Ready Transfer Foam, 4x5 (in/in) 1 x Per Week/30 Days Discharge Instructions: Apply Hydrofera  Blue Ready to wound bed as directed Secondary Dressing: ABD Pad 5x9 (in/in) 1 x Per Week/30 Days Discharge Instructions: Cover with ABD pad Compression Wrap: Profore Lite LF 3 Multilayer Compression Bandaging System 1 x Per Week/30 Days Discharge Instructions: Apply 3 multi-layer wrap as prescribed. Compression Stockings: [place] Multi Layer (toes-in) (DME) Compression Amount: 30-40 mmHg (right) Discharge Instructions: Apply to lower extremity as directed. 1. Would recommend that we going continue with the wound care measures as before and the patient is in agreement with plan. This includes the use of the Hydrofera Blue dressing to all wound locations. 2. Also can recommend that we have the patient continue with the 3 layer compression wrap will actually do this bilaterally. 3. I am also can recommend that she continue to elevate her legs much as possible to help with edema control. 4. We are going to go ahead and see about ordering her bilateral Velcro compression wraps, juxta lite. We will see patient back for reevaluation in 1 week here in the clinic. If anything worsens or changes patient will contact our office for additional recommendations. Electronic Signature(s) Signed: 10/29/2021 4:19:37 PM By: Worthy Keeler PA-C Entered By: Worthy Keeler on 10/29/2021 16:19:37 [name] ([id number]) -------------------------------------------------------------------------------- SuperBill Details Patient Name: [name] Date of Service: 10/29/2021 Medical Record Number: [id number] Patient Account Number: [id number] Date of Birth/Sex: September 10, 1945 (76 y.o. 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 Physician Procedures CPT4 Code Description: [id number] - WC PHYS LEVEL 4 - EST PT Modifier: Quantity: 1 CPT4 Code Description: ICD-10 Diagnosis Description I89.0 Lymphedema, not elsewhere classified I87.332 Chronic venous hypertension (idiopathic) with ulcer and inflammation of le L97.828 Non-pressure chronic ulcer of other part of left lower leg with  other spec Modifier: ft lower extremity ified severity Quantity: Electronic Signature(s) Signed: 10/29/2021 4:20:05 PM By: Worthy Keeler PA-C Previous Signature: 10/29/2021 4:02:21 PM Version By: Carlene Coria RN Entered By: Worthy Keeler on 10/29/2021 16:20:05

## 2021-10-30 ENCOUNTER — Other Ambulatory Visit: Payer: Self-pay | Admitting: Oncology

## 2021-10-30 NOTE — Progress Notes (Signed)
FLORESTINE, CARMICAL ([id number]) Visit Report for 10/29/2021 Arrival Information Details Patient Name: [name], [name] Date of Service: 10/29/2021 2:45 PM Medical Record Number: [id number] Patient Account Number: [id number] Date of Birth/Sex: 04-24-1946 (76 y.o. F) Treating RN: Carlene Coria Primary Care Lamya Lausch: [name] Other Clinician: Referring Rosalina Dingwall: [name] Treating Carnell Casamento/Extender: Skipper Cliche in Treatment: 66 Visit Information History Since Last Visit All ordered tests and consults were completed: No Patient Arrived: Wheel Chair Added or deleted any medications: No Arrival Time: 14:44 Any new allergies or adverse reactions: No Accompanied By: self Had a fall or experienced change in No Transfer Assistance: None activities of daily living that may affect Patient Identification Verified: Yes risk of falls: Secondary Verification Process Completed: Yes Signs or symptoms of abuse/neglect since last visito No Patient Requires Transmission-Based Precautions: No Hospitalized since last visit: No Patient Has Alerts: Yes Implantable device outside of the clinic excluding No Patient Alerts: NOT DIABETIC cellular tissue based products placed in the center since last visit: Has Dressing in Place as Prescribed: Yes Pain Present Now: No Electronic Signature(s) Signed: 10/30/2021 3:39:51 PM By: Carlene Coria RN Entered By: Carlene Coria on 10/29/2021 14:47:12 [name] ([id number]) -------------------------------------------------------------------------------- Clinic Level of Care Assessment Details Patient Name: [name] Date of Service: 10/29/2021 2:45 PM Medical Record Number: [id number] Patient Account Number: [id number] Date of Birth/Sex: [birth year] (76 y.o. F) Treating RN: Carlene Coria Primary Care Ojas Coone: [name] Other Clinician: Referring Lavon Horn: [name] Treating Ayme Short/Extender: Skipper Cliche in Treatment: 40 Clinic  Level of Care Assessment Items TOOL 4 Quantity Score []  - Use when only an EandM is performed on FOLLOW-UP visit 0 ASSESSMENTS - Nursing Assessment / Reassessment []  - Reassessment of Co-morbidities (includes updates in patient status) 0 []  - 0 Reassessment of Adherence to Treatment Plan ASSESSMENTS - Wound and Skin Assessment / Reassessment []  - Simple Wound Assessment / Reassessment - one wound 0 []  - 0 Complex Wound Assessment / Reassessment - multiple wounds []  - 0 Dermatologic / Skin Assessment (not related to wound area) ASSESSMENTS - Focused Assessment []  - Circumferential Edema Measurements - multi extremities 0 []  - 0 Nutritional Assessment / Counseling / Intervention []  - 0 Lower Extremity Assessment (monofilament, tuning fork, pulses) []  - 0 Peripheral Arterial Disease Assessment (using hand held doppler) ASSESSMENTS - Ostomy and/or Continence Assessment and Care []  - Incontinence Assessment and Management 0 []  - 0 Ostomy Care Assessment and Management (repouching, etc.) PROCESS - Coordination of Care []  - Simple Patient / Family Education for ongoing care 0 []  - 0 Complex (extensive) Patient / Family Education for ongoing care []  - 0 Staff obtains Programmer, systems, Records, Test Results / Process Orders []  - 0 Staff telephones HHA, Nursing Homes / Clarify orders / etc []  - 0 Routine Transfer to another Facility (non-emergent condition) []  - 0 Routine Hospital Admission (non-emergent condition) []  - 0 New Admissions / Biomedical engineer / Ordering NPWT, Apligraf, etc. []  - 0 Emergency Hospital Admission (emergent condition) []  - 0 Simple Discharge Coordination []  - 0 Complex (extensive) Discharge Coordination PROCESS - Special Needs []  - Pediatric / Minor Patient Management 0 []  - 0 Isolation Patient Management []  - 0 Hearing / Language / Visual special needs []  - 0 Assessment of Community assistance (transportation, D/C planning, etc.) []  - 0 Additional  assistance / Altered mentation []  - 0 Support Surface(s) Assessment (bed, cushion, seat, etc.) INTERVENTIONS - Wound Cleansing / Measurement [name], [name] ([id number]) []  - 0 Simple Wound Cleansing - one wound []  -  0 Complex Wound Cleansing - multiple wounds []  - 0 Wound Imaging (photographs - any number of wounds) []  - 0 Wound Tracing (instead of photographs) []  - 0 Simple Wound Measurement - one wound []  - 0 Complex Wound Measurement - multiple wounds INTERVENTIONS - Wound Dressings []  - Small Wound Dressing one or multiple wounds 0 []  - 0 Medium Wound Dressing one or multiple wounds []  - 0 Large Wound Dressing one or multiple wounds []  - 0 Application of Medications - topical []  - 0 Application of Medications - injection INTERVENTIONS - Miscellaneous []  - External ear exam 0 []  - 0 Specimen Collection (cultures, biopsies, blood, body fluids, etc.) []  - 0 Specimen(s) / Culture(s) sent or taken to Lab for analysis []  - 0 Patient Transfer (multiple staff / Civil Service fast streamer / Similar devices) []  - 0 Simple Staple / Suture removal (25 or less) []  - 0 Complex Staple / Suture removal (26 or more) []  - 0 Hypo / Hyperglycemic Management (close monitor of Blood Glucose) []  - 0 Ankle / Brachial Index (ABI) - do not check if billed separately []  - 0 Vital Signs Has the patient been seen at the hospital within the last three years: Yes Total Score: 0 Level Of Care: ____ Electronic Signature(s) Signed: 10/30/2021 3:39:51 PM By: Carlene Coria RN Entered By: Carlene Coria on 10/29/2021 16:01:47 [name] ([id number]) -------------------------------------------------------------------------------- Compression Therapy Details Patient Name: [name] Date of Service: 10/29/2021 2:45 PM Medical Record Number: [id number] Patient Account Number: [id number] Date of Birth/Sex: 1946-08-16 (76 y.o. F) Treating RN: Carlene Coria Primary Care Jef Futch: [name] Other  Clinician: Referring Avery Eustice: [name] Treating Saint Hank/Extender: Skipper Cliche in Treatment: 40 Compression Therapy Performed for Wound Assessment: Wound #3 Left Lower Leg Performed By: Clinician Carlene Coria, RN Compression Type: Three Layer Post Procedure Diagnosis Same as Pre-procedure Electronic Signature(s) Signed: 10/29/2021 3:56:47 PM By: Carlene Coria RN Entered By: Carlene Coria on 10/29/2021 15:56:46 [name] ([id number]) -------------------------------------------------------------------------------- Compression Therapy Details Patient Name: [name] Date of Service: 10/29/2021 2:45 PM Medical Record Number: [id number] Patient Account Number: [id number] Date of Birth/Sex: October 24, 1945 (76 y.o. F) Treating RN: Carlene Coria Primary Care Emmaus Brandi: [name] Other Clinician: Referring Valentin Benney: [name] Treating Duanne Duchesne/Extender: Skipper Cliche in Treatment: 40 Compression Therapy Performed for Wound Assessment: Wound #4 Right,Lateral Lower Leg Performed By: Clinician Carlene Coria, RN Compression Type: Three Layer Post Procedure Diagnosis Same as Pre-procedure Electronic Signature(s) Signed: 10/29/2021 3:57:07 PM By: Carlene Coria RN Entered By: Carlene Coria on 10/29/2021 15:57:07 [name] ([id number]) -------------------------------------------------------------------------------- Encounter Discharge Information Details Patient Name: [name] Date of Service: 10/29/2021 2:45 PM Medical Record Number: [id number] Patient Account Number: [id number] Date of Birth/Sex: 1946-02-16 (76 y.o. F) Treating RN: Carlene Coria Primary Care Donnella Morford: [name] Other Clinician: Referring Antjuan Rothe: [name] Treating Mareta Chesnut/Extender: Skipper Cliche in Treatment: 18 Encounter Discharge Information Items Post Procedure Vitals Discharge Condition: Stable Temperature (F): 98.3 Ambulatory Status: Wheelchair Pulse (bpm):  65 Discharge Destination: Home Respiratory Rate (breaths/min): 18 Transportation: Private Auto Blood Pressure (mmHg): 199/78 Accompanied By: self Schedule Follow-up Appointment: Yes Clinical Summary of Care: Patient Declined Electronic Signature(s) Signed: 10/29/2021 4:05:57 PM By: Carlene Coria RN Entered By: Carlene Coria on 10/29/2021 16:05:57 [name] ([id number]) -------------------------------------------------------------------------------- Lower Extremity Assessment Details Patient Name: [name] Date of Service: 10/29/2021 2:45 PM Medical Record Number: [id number] Patient Account Number: [id number] Date of Birth/Sex: [birth year] (76 y.o. F) Treating RN: Carlene Coria Primary Care Starr Engel: [name] Other Clinician: Referring Emmanuelle Coxe: [name] Treating  Cyanne Delmar/Extender: [name] Weeks in Treatment: 40 Edema Assessment Assessed: [Left: No] [Right: Yes] Edema: [Left: Ye] [Right: s] Calf Left: Right: Point of Measurement: 31 cm From Medial Instep 37 cm 42 cm Ankle Left: Right: Point of Measurement: 10 cm From Medial Instep 25 cm 25 cm Knee To Floor Left: Right: From Medial Instep 42 cm 42 cm Vascular Assessment Pulses: Dorsalis Pedis Palpable: [Left:Yes] [Right:Yes] Electronic Signature(s) Signed: 10/30/2021 3:39:51 PM By: Carlene Coria RN Entered By: Carlene Coria on 10/29/2021 15:17:27 [name] ([id number]) -------------------------------------------------------------------------------- Multi Wound Chart Details Patient Name: [name] Date of Service: 10/29/2021 2:45 PM Medical Record Number: [id number] Patient Account Number: [id number] Date of Birth/Sex: 1946-02-21 (76 y.o. F) Treating RN: Carlene Coria Primary Care Natausha Jungwirth: [name] Other Clinician: Referring Carlene Bickley: [name] Treating Crimson Beer/Extender: Skipper Cliche in Treatment: 40 Vital Signs Height(in): 69 Pulse(bpm): 52 Weight(lbs): 244 Blood  Pressure(mmHg): 199/79 Body Mass Index(BMI): 36 Temperature(F): 98.3 Respiratory Rate(breaths/min): 18 Photos: [N/A:N/A] Wound Location: Left Lower Leg Right, Lateral Lower Leg N/A Wounding Event: Gradually Appeared Gradually Appeared N/A Primary Etiology: Lymphedema Venous Leg Ulcer N/A Comorbid History: Cataracts, Lymphedema, Sleep Cataracts, Lymphedema, Sleep N/A Apnea, Congestive Heart Failure, Apnea, Congestive Heart Failure, Hypertension, Osteoarthritis, Hypertension, Osteoarthritis, Neuropathy, Received Neuropathy, Received Chemotherapy, Received Radiation Chemotherapy, Received Radiation Date Acquired: 08/15/2021 10/27/2021 N/A Weeks of Treatment: 8 0 N/A Wound Status: Open Open N/A Wound Recurrence: No No N/A Measurements L x W x D (cm) 6x3x0.1 1x1.5x0.1 N/A Area (cm) : 14.137 1.178 N/A Volume (cm) : 1.414 0.118 N/A % Reduction in Area: 76.50% N/A N/A % Reduction in Volume: 76.50% N/A N/A Classification: Full Thickness Without Exposed Full Thickness Without Exposed N/A Support Structures Support Structures Exudate Amount: Medium Medium N/A Exudate Type: Serosanguineous Serosanguineous N/A Exudate Color: red, brown red, brown N/A Granulation Amount: Large (67-100%) Medium (34-66%) N/A Granulation Quality: Red, Hyper-granulation Red N/A Necrotic Amount: Small (1-33%) Medium (34-66%) N/A Exposed Structures: Fat Layer (Subcutaneous Tissue): Fat Layer (Subcutaneous Tissue): N/A Yes Yes Fascia: No Fascia: No Tendon: No Tendon: No Muscle: No Muscle: No Joint: No Joint: No Bone: No Bone: No Epithelialization: None None N/A Treatment Notes Electronic Signature(s) Signed: 10/30/2021 3:39:51 PM By: Carlene Coria RN Entered By: Carlene Coria on 10/29/2021 15:18:26 [name], [name] ([id number]) [name], [name] ([id number]) -------------------------------------------------------------------------------- Multi-Disciplinary Care Plan Details Patient Name: [name] Date of Service: 10/29/2021 2:45 PM Medical Record Number: [id number] Patient Account Number: [id number] Date of Birth/Sex: 09/16/1945 (76 y.o. F) Treating RN: Carlene Coria Primary Care Harvey Matlack: [name] Other Clinician: Referring Coila Wardell: [name] Treating Imane Burrough/Extender: Skipper Cliche in Treatment: 40 Active Inactive Electronic Signature(s) Signed: 10/30/2021 3:39:51 PM By: Carlene Coria RN Entered By: Carlene Coria on 10/29/2021 15:17:58 [name] ([id number]) -------------------------------------------------------------------------------- Pain Assessment Details Patient Name: [name] Date of Service: 10/29/2021 2:45 PM Medical Record Number: [id number] Patient Account Number: [id number] Date of Birth/Sex: 1946/06/07 (76 y.o. F) Treating RN: Carlene Coria Primary Care Kaarin Pardy: [name] Other Clinician: Referring Rafael Quesada: [name] Treating Evelene Roussin/Extender: Skipper Cliche in Treatment: 40 Active Problems Location of Pain Severity and Description of Pain Patient Has Paino No Site Locations Pain Management and Medication Current Pain Management: Electronic Signature(s) Signed: 10/30/2021 3:39:51 PM By: Carlene Coria RN Entered By: Carlene Coria on 10/29/2021 14:47:42 [name] ([id number]) -------------------------------------------------------------------------------- Patient/Caregiver Education Details Patient Name: [name] Date of Service: 10/29/2021 2:45 PM Medical Record Number: [id number] Patient Account Number: [id number] Date of Birth/Gender: 27-May-1946 (76 y.o. F) Treating RN: Carlene Coria Primary Care Physician: [name] Other  Clinician: Referring Physician: [name] Treating Physician/Extender: Skipper Cliche in Treatment: 26 Education Assessment Education Provided To: Patient Education Topics Provided Wound/Skin Impairment: Methods: Explain/Verbal Responses: State content  correctly Electronic Signature(s) Signed: 10/30/2021 3:39:51 PM By: Carlene Coria RN Entered By: Carlene Coria on 10/29/2021 16:02:34 [name] ([id number]) -------------------------------------------------------------------------------- Wound Assessment Details Patient Name: [name] Date of Service: 10/29/2021 2:45 PM Medical Record Number: [id number] Patient Account Number: [id number] Date of Birth/Sex: 1946/02/03 (76 y.o. F) Treating RN: Carlene Coria Primary Care Cambry Spampinato: [name] Other Clinician: Referring Boyce Keltner: [name] Treating Miraya Cudney/Extender: Skipper Cliche in Treatment: 40 Wound Status Wound Number: 3 Primary Lymphedema Etiology: Wound Location: Left Lower Leg Wound Open Wounding Event: Gradually Appeared Status: Date Acquired: 08/15/2021 Comorbid Cataracts, Lymphedema, Sleep Apnea, Congestive Heart Weeks Of Treatment: 8 History: Failure, Hypertension, Osteoarthritis, Neuropathy, Clustered Wound: No Received Chemotherapy, Received Radiation Photos Wound Measurements Length: (cm) 6 Width: (cm) 3 Depth: (cm) 0.1 Area: (cm) 14.137 Volume: (cm) 1.414 % Reduction in Area: 76.5% % Reduction in Volume: 76.5% Epithelialization: None Tunneling: No Undermining: No Wound Description Classification: Full Thickness Without Exposed Support Structu Exudate Amount: Medium Exudate Type: Serosanguineous Exudate Color: red, brown res Foul Odor After Cleansing: No Slough/Fibrino Yes Wound Bed Granulation Amount: Large (67-100%) Exposed Structure Granulation Quality: Red, Hyper-granulation Fascia Exposed: No Necrotic Amount: Small (1-33%) Fat Layer (Subcutaneous Tissue) Exposed: Yes Necrotic Quality: Adherent Slough Tendon Exposed: No Muscle Exposed: No Joint Exposed: No Bone Exposed: No Treatment Notes Wound #3 (Lower Leg) Wound Laterality: Left Cleanser Soap and Water Discharge Instruction: Gently cleanse wound with antibacterial  soap, rinse and pat dry prior to dressing wounds Wound Cleanser [name], [name] ([id number]) Discharge Instruction: Wash your hands with soap and water. Remove old dressing, discard into plastic bag and place into trash. Cleanse the wound with Wound Cleanser prior to applying a clean dressing using gauze sponges, not tissues or cotton balls. Do not scrub or use excessive force. Pat dry using gauze sponges, not tissue or cotton balls. Peri-Wound Care Topical Primary Dressing Hydrofera Blue Ready Transfer Foam, 4x5 (in/in) Discharge Instruction: Apply Hydrofera Blue Ready to wound bed as directed Secondary Dressing ABD Pad 5x9 (in/in) Discharge Instruction: Cover with ABD pad Secured With Compression Wrap Profore Lite LF 3 Multilayer Compression [place] Discharge Instruction: Apply 3 multi-layer wrap as prescribed. Compression Stockings Circaid Juxta Lite Compression Wrap Quantity: 1 Left Leg Compression Amount: 30-40 mmHg Discharge Instruction: Apply Circaid Juxta Lite Compression Wrap as directed Add-Ons Electronic Signature(s) Signed: 10/30/2021 3:39:51 PM By: Carlene Coria RN Entered By: Carlene Coria on 10/29/2021 14:57:38 [name] ([id number]) -------------------------------------------------------------------------------- Wound Assessment Details Patient Name: [name] Date of Service: 10/29/2021 2:45 PM Medical Record Number: [id number] Patient Account Number: [id number] Date of Birth/Sex: 25-Sep-1945 (76 y.o. F) Treating RN: Carlene Coria Primary Care Maylee Bare: [name] Other Clinician: Referring Danaija Eskridge: [name] Treating Chuckie Mccathern/Extender: Skipper Cliche in Treatment: 40 Wound Status Wound Number: 4 Primary Venous Leg Ulcer Etiology: Wound Location: Right, Lateral Lower Leg Wound Open Wounding Event: Gradually Appeared Status: Date Acquired: 10/27/2021 Comorbid Cataracts, Lymphedema, Sleep Apnea, Congestive Heart Weeks Of  Treatment: 0 History: Failure, Hypertension, Osteoarthritis, Neuropathy, Clustered Wound: No Received Chemotherapy, Received Radiation Photos Wound Measurements Length: (cm) 1 Width: (cm) 1.5 Depth: (cm) 0.1 Area: (cm) 1.178 Volume: (cm) 0.118 % Reduction in Area: % Reduction in Volume: Epithelialization: None Tunneling: No Undermining: No Wound Description Classification: Full Thickness Without Exposed Support Structu Exudate Amount: Medium Exudate Type: Serosanguineous Exudate Color: red, brown res Foul Odor  After Cleansing: No Slough/Fibrino Yes Wound Bed Granulation Amount: Medium (34-66%) Exposed Structure Granulation Quality: Red Fascia Exposed: No Necrotic Amount: Medium (34-66%) Fat Layer (Subcutaneous Tissue) Exposed: Yes Necrotic Quality: Adherent Slough Tendon Exposed: No Muscle Exposed: No Joint Exposed: No Bone Exposed: No Treatment Notes Wound #4 (Lower Leg) Wound Laterality: Right, Lateral Cleanser Soap and Water Discharge Instruction: Gently cleanse wound with antibacterial soap, rinse and pat dry prior to dressing wounds Wound Cleanser [name], [name] ([id number]) Discharge Instruction: Wash your hands with soap and water. Remove old dressing, discard into plastic bag and place into trash. Cleanse the wound with Wound Cleanser prior to applying a clean dressing using gauze sponges, not tissues or cotton balls. Do not scrub or use excessive force. Pat dry using gauze sponges, not tissue or cotton balls. Peri-Wound Care Topical Primary Dressing Hydrofera Blue Ready Transfer Foam, 4x5 (in/in) Discharge Instruction: Apply Hydrofera Blue Ready to wound bed as directed Secondary Dressing ABD Pad 5x9 (in/in) Discharge Instruction: Cover with ABD pad Secured With Compression Wrap Profore Lite LF 3 Multilayer Compression [place] Discharge Instruction: Apply 3 multi-layer wrap as prescribed. Compression Stockings [place] Multi Layer  (toes-in) Quantity: 1 Right Leg Compression Amount: 30-40 mmHg Discharge Instruction: Apply to lower extremity as directed. Add-Ons Electronic Signature(s) Signed: 10/30/2021 3:39:51 PM By: Carlene Coria RN Entered By: Carlene Coria on 10/29/2021 15:00:05 [name] ([id number]) -------------------------------------------------------------------------------- Union Details Patient Name: [name] Date of Service: 10/29/2021 2:45 PM Medical Record Number: [id number] Patient Account Number: [id number] Date of Birth/Sex: 08/09/1946 (76 y.o. F) Treating RN: Carlene Coria Primary Care Selena Swaminathan: [name] Other Clinician: Referring Norvella Loscalzo: [name] Treating Pacer Dorn/Extender: Skipper Cliche in Treatment: 40 Vital Signs Time Taken: 14:47 Temperature (F): 98.3 Height (in): 69 Pulse (bpm): 65 Weight (lbs): 244 Respiratory Rate (breaths/min): 18 Body Mass Index (BMI): 36 Blood Pressure (mmHg): 199/79 Reference Range: 80 - 120 mg / dl Electronic Signature(s) Signed: 10/30/2021 3:39:51 PM By: Carlene Coria RN Entered By: Carlene Coria on 10/29/2021 14:47:31

## 2021-11-05 ENCOUNTER — Other Ambulatory Visit: Payer: Self-pay

## 2021-11-05 ENCOUNTER — Encounter: Payer: Medicare Other | Admitting: Physician Assistant

## 2021-11-05 DIAGNOSIS — I890 Lymphedema, not elsewhere classified: Secondary | ICD-10-CM | POA: Diagnosis not present

## 2021-11-05 NOTE — Progress Notes (Signed)
CLOVER, FEEHAN ([id number]) Visit Report for 11/05/2021 Arrival Information Details Patient Name: [name], [name] Date of Service: 11/05/2021 2:45 PM Medical Record Number: [id number] Patient Account Number: [id number] Date of Birth/Sex: [birth year] (76 y.o. F) Treating RN: Carlene Coria Primary Care Treonna Klee: [name] Other Clinician: Referring Atiba Kimberlin: [name] Treating Naria Abbey/Extender: Skipper Cliche in Treatment: 66 Visit Information History Since Last Visit All ordered tests and consults were completed: No Patient Arrived: Wheel Chair Added or deleted any medications: No Arrival Time: 14:40 Any new allergies or adverse reactions: No Accompanied By: self Had a fall or experienced change in No Transfer Assistance: None activities of daily living that may affect Patient Identification Verified: Yes risk of falls: Secondary Verification Process Completed: Yes Signs or symptoms of abuse/neglect since last visito No Patient Requires Transmission-Based Precautions: No Hospitalized since last visit: No Patient Has Alerts: Yes Implantable device outside of the clinic excluding No Patient Alerts: NOT DIABETIC cellular tissue based products placed in the center since last visit: Has Dressing in Place as Prescribed: Yes Has Compression in Place as Prescribed: Yes Pain Present Now: No Electronic Signature(s) Signed: 11/05/2021 4:37:51 PM By: Carlene Coria RN Entered By: Carlene Coria on 11/05/2021 14:49:16 [name] ([id number]) -------------------------------------------------------------------------------- Clinic Level of Care Assessment Details Patient Name: [name] Date of Service: 11/05/2021 2:45 PM Medical Record Number: [id number] Patient Account Number: [id number] Date of Birth/Sex: 1946/02/13 (76 y.o. F) Treating RN: Carlene Coria Primary Care Dacota Devall: [name] Other Clinician: Referring Ketty Bitton: [name] Treating Arieon Scalzo/Extender:  Skipper Cliche in Treatment: 41 Clinic Level of Care Assessment Items TOOL 4 Quantity Score []  - Use when only an EandM is performed on FOLLOW-UP visit 0 ASSESSMENTS - Nursing Assessment / Reassessment []  - Reassessment of Co-morbidities (includes updates in patient status) 0 []  - 0 Reassessment of Adherence to Treatment Plan ASSESSMENTS - Wound and Skin Assessment / Reassessment []  - Simple Wound Assessment / Reassessment - one wound 0 []  - 0 Complex Wound Assessment / Reassessment - multiple wounds []  - 0 Dermatologic / Skin Assessment (not related to wound area) ASSESSMENTS - Focused Assessment []  - Circumferential Edema Measurements - multi extremities 0 []  - 0 Nutritional Assessment / Counseling / Intervention []  - 0 Lower Extremity Assessment (monofilament, tuning fork, pulses) []  - 0 Peripheral Arterial Disease Assessment (using hand held doppler) ASSESSMENTS - Ostomy and/or Continence Assessment and Care []  - Incontinence Assessment and Management 0 []  - 0 Ostomy Care Assessment and Management (repouching, etc.) PROCESS - Coordination of Care []  - Simple Patient / Family Education for ongoing care 0 []  - 0 Complex (extensive) Patient / Family Education for ongoing care []  - 0 Staff obtains Programmer, systems, Records, Test Results / Process Orders []  - 0 Staff telephones HHA, Nursing Homes / Clarify orders / etc []  - 0 Routine Transfer to another Facility (non-emergent condition) []  - 0 Routine Hospital Admission (non-emergent condition) []  - 0 New Admissions / Biomedical engineer / Ordering NPWT, Apligraf, etc. []  - 0 Emergency Hospital Admission (emergent condition) []  - 0 Simple Discharge Coordination []  - 0 Complex (extensive) Discharge Coordination PROCESS - Special Needs []  - Pediatric / Minor Patient Management 0 []  - 0 Isolation Patient Management []  - 0 Hearing / Language / Visual special needs []  - 0 Assessment of Community assistance  (transportation, D/C planning, etc.) []  - 0 Additional assistance / Altered mentation []  - 0 Support Surface(s) Assessment (bed, cushion, seat, etc.) INTERVENTIONS - Wound Cleansing / Measurement [name], [name] ([id number]) []  - 0 Simple  Wound Cleansing - one wound []  - 0 Complex Wound Cleansing - multiple wounds []  - 0 Wound Imaging (photographs - any number of wounds) []  - 0 Wound Tracing (instead of photographs) []  - 0 Simple Wound Measurement - one wound []  - 0 Complex Wound Measurement - multiple wounds INTERVENTIONS - Wound Dressings []  - Small Wound Dressing one or multiple wounds 0 []  - 0 Medium Wound Dressing one or multiple wounds []  - 0 Large Wound Dressing one or multiple wounds []  - 0 Application of Medications - topical []  - 0 Application of Medications - injection INTERVENTIONS - Miscellaneous []  - External ear exam 0 []  - 0 Specimen Collection (cultures, biopsies, blood, body fluids, etc.) []  - 0 Specimen(s) / Culture(s) sent or taken to Lab for analysis []  - 0 Patient Transfer (multiple staff / Civil Service fast streamer / Similar devices) []  - 0 Simple Staple / Suture removal (25 or less) []  - 0 Complex Staple / Suture removal (26 or more) []  - 0 Hypo / Hyperglycemic Management (close monitor of Blood Glucose) []  - 0 Ankle / Brachial Index (ABI) - do not check if billed separately []  - 0 Vital Signs Has the patient been seen at the hospital within the last three years: Yes Total Score: 0 Level Of Care: ____ Electronic Signature(s) Signed: 11/05/2021 4:37:51 PM By: Carlene Coria RN Entered By: Carlene Coria on 11/05/2021 15:10:13 [name] ([id number]) -------------------------------------------------------------------------------- Compression Therapy Details Patient Name: [name] Date of Service: 11/05/2021 2:45 PM Medical Record Number: [id number] Patient Account Number: [id number] Date of Birth/Sex: 13-Apr-1946 (76 y.o. F) Treating RN: Carlene Coria Primary Care Maizee Reinhold: [name] Other Clinician: Referring Zanylah Hardie: [name] Treating Khandi Kernes/Extender: Skipper Cliche in Treatment: 41 Compression Therapy Performed for Wound Assessment: Wound #3 Left Lower Leg Performed By: Clinician Carlene Coria, RN Compression Type: Three Layer Post Procedure Diagnosis Same as Pre-procedure Electronic Signature(s) Signed: 11/05/2021 4:37:51 PM By: Carlene Coria RN Entered By: Carlene Coria on 11/05/2021 15:09:14 [name] ([id number]) -------------------------------------------------------------------------------- Encounter Discharge Information Details Patient Name: [name] Date of Service: 11/05/2021 2:45 PM Medical Record Number: [id number] Patient Account Number: [id number] Date of Birth/Sex: 01-12-1946 (76 y.o. F) Treating RN: Carlene Coria Primary Care Jiro Kiester: [name] Other Clinician: Referring Jackolyn Geron: [name] Treating Delorse Shane/Extender: Skipper Cliche in Treatment: 63 Encounter Discharge Information Items Discharge Condition: Stable Ambulatory Status: Ambulatory Discharge Destination: Home Transportation: Private Auto Accompanied By: self Schedule Follow-up Appointment: Yes Clinical Summary of Care: Patient Declined Electronic Signature(s) Signed: 11/05/2021 4:37:51 PM By: Carlene Coria RN Entered By: Carlene Coria on 11/05/2021 15:11:35 [name] ([id number]) -------------------------------------------------------------------------------- Lower Extremity Assessment Details Patient Name: [name] Date of Service: 11/05/2021 2:45 PM Medical Record Number: [id number] Patient Account Number: [id number] Date of Birth/Sex: 06-13-1946 (76 y.o. F) Treating RN: Carlene Coria Primary Care Torrie Lafavor: [name] Other Clinician: Referring Jarry Manon: [name] Treating Manya Balash/Extender: Skipper Cliche in Treatment: 41 Edema Assessment Assessed: [Left: No] [Right:  No] Edema: [Left: Yes] [Right: Yes] Calf Left: Right: Point of Measurement: 31 cm From Medial Instep 36 cm 39 cm Ankle Left: Right: Point of Measurement: 10 cm From Medial Instep 24 cm 25 cm Vascular Assessment Pulses: Dorsalis Pedis Palpable: [Left:Yes] [Right:Yes] Electronic Signature(s) Signed: 11/05/2021 4:37:51 PM By: Carlene Coria RN Entered By: Carlene Coria on 11/05/2021 15:00:56 [name] ([id number]) -------------------------------------------------------------------------------- Multi Wound Chart Details Patient Name: [name] Date of Service: 11/05/2021 2:45 PM Medical Record Number: [id number] Patient Account Number: [id number] Date of Birth/Sex: 05-08-1946 (76 y.o. F) Treating RN:  Carlene Coria Primary Care Tylar Merendino: [name] Other Clinician: Referring Eniola Cerullo: [name] Treating Elim Peale/Extender: Skipper Cliche in Treatment: 41 Vital Signs Height(in): 55 Pulse(bpm): 32 Weight(lbs): 244 Blood Pressure(mmHg): 168/90 Body Mass Index(BMI): 36 Temperature(F): 98.7 Respiratory Rate(breaths/min): 18 Photos: [N/A:N/A] Wound Location: Left Lower Leg Right, Lateral Lower Leg N/A Wounding Event: Gradually Appeared Gradually Appeared N/A Primary Etiology: Lymphedema Venous Leg Ulcer N/A Comorbid History: Cataracts, Lymphedema, Sleep Cataracts, Lymphedema, Sleep N/A Apnea, Congestive Heart Failure, Apnea, Congestive Heart Failure, Hypertension, Osteoarthritis, Hypertension, Osteoarthritis, Neuropathy, Received Neuropathy, Received Chemotherapy, Received Radiation Chemotherapy, Received Radiation Date Acquired: 08/15/2021 10/27/2021 N/A Weeks of Treatment: 9 1 N/A Wound Status: Open Open N/A Wound Recurrence: No No N/A Measurements L x W x D (cm) 6x3x0.1 2.5x2.5x0.1 N/A Area (cm) : 14.137 4.909 N/A Volume (cm) : 1.414 0.491 N/A % Reduction in Area: 76.50% -316.70% N/A % Reduction in Volume: 76.50% -316.10% N/A Classification: Full Thickness  Without Exposed Full Thickness Without Exposed N/A Support Structures Support Structures Exudate Amount: Medium Medium N/A Exudate Type: Serosanguineous Serosanguineous N/A Exudate Color: red, brown red, brown N/A Granulation Amount: Large (67-100%) Medium (34-66%) N/A Granulation Quality: Red, Hyper-granulation Red N/A Necrotic Amount: Small (1-33%) Medium (34-66%) N/A Exposed Structures: Fat Layer (Subcutaneous Tissue): Fat Layer (Subcutaneous Tissue): N/A Yes Yes Fascia: No Fascia: No Tendon: No Tendon: No Muscle: No Muscle: No Joint: No Joint: No Bone: No Bone: No Epithelialization: None None N/A Treatment Notes Electronic Signature(s) Signed: 11/05/2021 4:37:51 PM By: Carlene Coria RN Entered By: Carlene Coria on 11/05/2021 15:01:24 [name], [name] ([id number]) [name], [name] ([id number]) -------------------------------------------------------------------------------- Multi-Disciplinary Care Plan Details Patient Name: [name] Date of Service: 11/05/2021 2:45 PM Medical Record Number: [id number] Patient Account Number: [id number] Date of Birth/Sex: [birth year] (76 y.o. F) Treating RN: Carlene Coria Primary Care Lavada Langsam: [name] Other Clinician: Referring Denys Labree: [name] Treating Marly Schuld/Extender: Skipper Cliche in Treatment: 40 Active Inactive Electronic Signature(s) Signed: 11/05/2021 4:37:51 PM By: Carlene Coria RN Entered By: Carlene Coria on 11/05/2021 15:01:06 [name] ([id number]) -------------------------------------------------------------------------------- Pain Assessment Details Patient Name: [name] Date of Service: 11/05/2021 2:45 PM Medical Record Number: [id number] Patient Account Number: [id number] Date of Birth/Sex: 1946-03-05 (76 y.o. F) Treating RN: Carlene Coria Primary Care Dua Mehler: [name] Other Clinician: Referring Ismael Treptow: [name] Treating Larrisha Babineau/Extender: Skipper Cliche in Treatment:  41 Active Problems Location of Pain Severity and Description of Pain Patient Has Paino No Site Locations Pain Management and Medication Current Pain Management: Electronic Signature(s) Signed: 11/05/2021 4:37:51 PM By: Carlene Coria RN Entered By: Carlene Coria on 11/05/2021 14:49:54 [name] ([id number]) -------------------------------------------------------------------------------- Patient/Caregiver Education Details Patient Name: [name] Date of Service: 11/05/2021 2:45 PM Medical Record Number: [id number] Patient Account Number: [id number] Date of Birth/Gender: 02-Sep-1946 (76 y.o. F) Treating RN: Carlene Coria Primary Care Physician: [name] Other Clinician: Referring Physician: [name] Treating Physician/Extender: Skipper Cliche in Treatment: 96 Education Assessment Education Provided To: Patient Education Topics Provided Wound/Skin Impairment: Methods: Explain/Verbal Responses: State content correctly Electronic Signature(s) Signed: 11/05/2021 4:37:51 PM By: Carlene Coria RN Entered By: Carlene Coria on 11/05/2021 15:10:50 [name] ([id number]) -------------------------------------------------------------------------------- Wound Assessment Details Patient Name: [name] Date of Service: 11/05/2021 2:45 PM Medical Record Number: [id number] Patient Account Number: [id number] Date of Birth/Sex: [birth year] (76 y.o. F) Treating RN: Carlene Coria Primary Care Yenni Carra: [name] Other Clinician: Referring Anaih Brander: [name] Treating Arienna Benegas/Extender: Skipper Cliche in Treatment: 41 Wound Status Wound Number: 3 Primary Lymphedema Etiology: Wound Location: Left Lower Leg Wound Open Wounding Event: Gradually Appeared  Status: Date Acquired: 08/15/2021 Comorbid Cataracts, Lymphedema, Sleep Apnea, Congestive Heart Weeks Of Treatment: 9 History: Failure, Hypertension, Osteoarthritis, Neuropathy, Clustered Wound:  No Received Chemotherapy, Received Radiation Photos Wound Measurements Length: (cm) 6 Width: (cm) 3 Depth: (cm) 0.1 Area: (cm) 14.137 Volume: (cm) 1.414 % Reduction in Area: 76.5% % Reduction in Volume: 76.5% Epithelialization: None Tunneling: No Undermining: No Wound Description Classification: Full Thickness Without Exposed Support Structu Exudate Amount: Medium Exudate Type: Serosanguineous Exudate Color: red, brown res Foul Odor After Cleansing: No Slough/Fibrino Yes Wound Bed Granulation Amount: Large (67-100%) Exposed Structure Granulation Quality: Red, Hyper-granulation Fascia Exposed: No Necrotic Amount: Small (1-33%) Fat Layer (Subcutaneous Tissue) Exposed: Yes Necrotic Quality: Adherent Slough Tendon Exposed: No Muscle Exposed: No Joint Exposed: No Bone Exposed: No Treatment Notes Wound #3 (Lower Leg) Wound Laterality: Left Cleanser Soap and Water Discharge Instruction: Gently cleanse wound with antibacterial soap, rinse and pat dry prior to dressing wounds Wound Cleanser [name], [name] ([id number]) Discharge Instruction: Wash your hands with soap and water. Remove old dressing, discard into plastic bag and place into trash. Cleanse the wound with Wound Cleanser prior to applying a clean dressing using gauze sponges, not tissues or cotton balls. Do not scrub or use excessive force. Pat dry using gauze sponges, not tissue or cotton balls. Peri-Wound Care Topical Primary Dressing Hydrofera Blue Ready Transfer Foam, 4x5 (in/in) Discharge Instruction: Apply Hydrofera Blue Ready to wound bed as directed Secondary Dressing ABD Pad 5x9 (in/in) Discharge Instruction: Cover with ABD pad Secured With Compression Wrap Profore Lite LF 3 Multilayer Compression [place] Discharge Instruction: Apply 3 multi-layer wrap as prescribed. Compression Stockings Circaid Juxta Lite Compression Wrap Quantity: 1 Left Leg Compression Amount: 30-40 mmHg Discharge  Instruction: Apply Circaid Juxta Lite Compression Wrap as directed Add-Ons Electronic Signature(s) Signed: 11/05/2021 4:37:51 PM By: Carlene Coria RN Entered By: Carlene Coria on 11/05/2021 14:59:17 [name] ([id number]) -------------------------------------------------------------------------------- Wound Assessment Details Patient Name: [name] Date of Service: 11/05/2021 2:45 PM Medical Record Number: [id number] Patient Account Number: [id number] Date of Birth/Sex: [birth year] (76 y.o. F) Treating RN: Carlene Coria Primary Care Vashawn Ekstein: [name] Other Clinician: Referring Larrell Rapozo: [name] Treating Darriel Utter/Extender: Skipper Cliche in Treatment: 41 Wound Status Wound Number: 4 Primary Venous Leg Ulcer Etiology: Wound Location: Right, Lateral Lower Leg Wound Open Wounding Event: Gradually Appeared Status: Date Acquired: 10/27/2021 Comorbid Cataracts, Lymphedema, Sleep Apnea, Congestive Heart Weeks Of Treatment: 1 History: Failure, Hypertension, Osteoarthritis, Neuropathy, Clustered Wound: No Received Chemotherapy, Received Radiation Photos Wound Measurements Length: (cm) 2.5 Width: (cm) 2.5 Depth: (cm) 0.1 Area: (cm) 4.909 Volume: (cm) 0.491 % Reduction in Area: -316.7% % Reduction in Volume: -316.1% Epithelialization: None Tunneling: No Undermining: No Wound Description Classification: Full Thickness Without Exposed Support Structures Exudate Amount: Medium Exudate Type: Serosanguineous Exudate Color: red, brown Foul Odor After Cleansing: No Slough/Fibrino Yes Wound Bed Granulation Amount: Medium (34-66%) Exposed Structure Granulation Quality: Red Fascia Exposed: No Necrotic Amount: Medium (34-66%) Fat Layer (Subcutaneous Tissue) Exposed: Yes Necrotic Quality: Adherent Slough Tendon Exposed: No Muscle Exposed: No Joint Exposed: No Bone Exposed: No Treatment Notes Wound #4 (Lower Leg) Wound Laterality: Right,  Lateral Cleanser Soap and Water Discharge Instruction: Gently cleanse wound with antibacterial soap, rinse and pat dry prior to dressing wounds Wound Cleanser [name], [name] ([id number]) Discharge Instruction: Wash your hands with soap and water. Remove old dressing, discard into plastic bag and place into trash. Cleanse the wound with Wound Cleanser prior to applying a clean dressing using gauze sponges, not tissues or cotton balls.  Do not scrub or use excessive force. Pat dry using gauze sponges, not tissue or cotton balls. Peri-Wound Care Topical Primary Dressing Hydrofera Blue Ready Transfer Foam, 4x5 (in/in) Discharge Instruction: Apply Hydrofera Blue Ready to wound bed as directed Secondary Dressing ABD Pad 5x9 (in/in) Discharge Instruction: Cover with ABD pad Secured With Compression Wrap Profore Lite LF 3 Multilayer Compression [place] Discharge Instruction: Apply 3 multi-layer wrap as prescribed. Compression Stockings Circaid Juxta Lite Compression Wrap Quantity: 1 Right Leg Compression Amount: 20-30 mmHg Discharge Instruction: Apply Circaid Juxta Lite Compression Wrap as directed Add-Ons Electronic Signature(s) Signed: 11/05/2021 4:37:51 PM By: Carlene Coria RN Entered By: Carlene Coria on 11/05/2021 14:59:45 [name] ([id number]) -------------------------------------------------------------------------------- [place] Details Patient Name: [name] Date of Service: 11/05/2021 2:45 PM Medical Record Number: [id number] Patient Account Number: [id number] Date of Birth/Sex: 04-03-1946 (76 y.o. F) Treating RN: Carlene Coria Primary Care Izumi Mixon: [name] Other Clinician: Referring Maaliyah Adolph: [name] Treating Brystol Wasilewski/Extender: Skipper Cliche in Treatment: 41 Vital Signs Time Taken: 14:49 Temperature (F): 98.7 Height (in): 69 Pulse (bpm): 67 Weight (lbs): 244 Respiratory Rate (breaths/min): 18 Body Mass Index (BMI): 36 Blood  Pressure (mmHg): 168/90 Reference Range: 80 - 120 mg / dl Electronic Signature(s) Signed: 11/05/2021 4:37:51 PM By: Carlene Coria RN Entered By: Carlene Coria on 11/05/2021 14:49:38

## 2021-11-05 NOTE — Progress Notes (Addendum)
[name] ([id number]) Visit Report for 11/05/2021 Chief Complaint Document Details Patient Name: [name] Date of Service: 11/05/2021 2:45 PM Medical Record Number: [id number] Patient Account Number: [id number] Date of Birth/Sex: 05-01-1946 (76 y.o. F) Treating RN: Carlene Coria Primary Care Provider: [name] Other Clinician: Referring Provider: [name] Treating Provider/Extender: Skipper Cliche in Treatment: 3 Information Obtained from: Patient Chief Complaint Left LE Ulcer Electronic Signature(s) Signed: 11/05/2021 2:52:51 PM By: Worthy Keeler PA-C Entered By: Worthy Keeler on 11/05/2021 14:52:51 [name] ([id number]) -------------------------------------------------------------------------------- HPI Details Patient Name: [name] Date of Service: 11/05/2021 2:45 PM Medical Record Number: [id number] Patient Account Number: [id number] Date of Birth/Sex: Mar 22, 1946 (76 y.o. F) Treating RN: Carlene Coria Primary Care Provider: [name] Other Clinician: Referring Provider: [name] Treating Provider/Extender: Skipper Cliche in Treatment: 39 History of Present Illness HPI Description: 03/15/2020 upon evaluation today patient presents today for initial evaluation here in our clinic concerning a wound that is on the left posterior lower extremity. Unfortunately this has been giving the patient some discomfort at this point which she notes has been affected her pretty much daily. The patient does have a history of venous insufficiency, hypertension, congestive heart failure, and a history of breast cancer. Fortunately there does not appear to be evidence of active infection at this time which is great news. No fevers, chills, nausea, vomiting, or diarrhea. Wound is not extremely large but does have some slough covering the surface of the wound. This is going require sharp debridement today. 03/15/2020 upon evaluation today patient  appears to be doing fairly well in regard to her wound. She does have some slough noted on the surface of the wound currently but I do believe the Iodoflex has been beneficial over the past week. There is no signs of active infection at this time which is great news and overall very pleased with the progress. No fevers, chills, nausea, vomiting, or diarrhea. 04/02/2020 upon evaluation today patient appears to be doing a little better in regard to her wound size wise this is not tremendously smaller she does have some slough buildup on the surface of the wound. With that being said this is going require some sharp debridement today to clear away some of the necrotic debris. Fortunately there is no evidence of active infection at this time. No fevers, chills, nausea, vomiting, or diarrhea. 04/10/2020 on evaluation today patient appears to be doing well with regard to her ulcer which is measuring somewhat smaller today. She actually has more granulation tissue noted which is also good news is no need for sharp debridement today. Fortunately there is no evidence of infection either which is also excellent. No fevers, chills, nausea, vomiting, or diarrhea. 04/26/2020 on evaluation today patient's wound actually is showing signs of good improvement which is great news. There does not appear to be any evidence of active infection. I do believe she is tolerating the collagen at this point. 05/03/2020 upon evaluation today patient's wound actually showed signs of good granulation at this time there does not appear to be any evidence of active infection which is great news and overall very pleased with where things stand. With that being said I do believe that the patient is can require some sharp debridement today but fortunately nothing too significant. 05/11/20 on evaluation today patient appears to be doing well in regard to her leg ulcer. She's been tolerating the dressing changes without complication.  Fortunately there is no signs of active infection at this time.  Overall I'm very pleased with where things stand. 05/18/2020 upon evaluation today patient's wound is showing signs of improvement is very dry and I think the collagen for the most part just dried to the wound bed. I am going to clean this away with sharp debridement in order to get this under control in my opinion. 05/25/2020 upon evaluation today patient actually appears to be doing well in regard to her leg ulcer. In fact upon inspection it appears she could potentially be completely healed but that is not guaranteed based on what we are seeing. There does not appear to be any signs of active infection which is great news. 05/31/2020 on evaluation today patient appears to be doing well with regard to her wounds. She in fact appears to be almost completely healed on the left leg there is just a very small opening remaining point 1 06/08/2020 upon evaluation today patient appears to be doing excellent in regard to her leg ulcer on the left in fact it appears to be that she is completely healed as of today. I see no signs of active infection at this time which is great news. No fevers, chills, nausea, vomiting, or diarrhea. READMISSION 01/16/2021 Patient that we have had in this clinic with a wound on the left posterior calf in the summer 2021 extending into October. She was felt to have chronic venous insufficiency. Per the patient's description she was discharged with what sounds like external compression stockings although she could not afford the "$75 per leg". She therefore did not get anything. Apparently her wound that she has currently is been in existence since January. She has been followed at vein and vascular with Unna boots and I think calcium alginate. She had ABIs done on 06/25/2020 that showed an noncompressible ABI on the right leg and the left leg but triphasic waveforms with good great toe pressures and a TBI of 0.90 on the  right and 0.91 on the left Surprisingly the patient also had venous reflux studies that were really unremarkable. This included no evidence of DVTs or SVTs but there was no evidence of deep venous insufficiency or superficial venous insufficiency in the greater saphenous or short saphenous veins bilaterally. I would wonder about more central venous issues or perhaps this is all lymphedema 01/25/2021 upon evaluation today patient appears to be doing decently well and guard to her wound. The good news is the Iodoflex that is job she saw Dr. Dellia Nims last week for readmission and to be honest I think that this has done extremely well over that week. I do believe that she can tolerate the sharp debridement today which will be good. I think that cleaning off the wound will likely allow [place] to be able to use a different type of dressing I do not think the Iodoflex will even be necessary based on how clean the wound looks. Fortunately there is no sign of active infection at this time which is great news. No fevers, chills, nausea, vomiting, or diarrhea. 02/12/2021 upon evaluation today patient appears to be doing well with regard to her leg ulcer. We did switch to alginate when she came for nurse visit I think is doing much better for her. Fortunately there does not appear to be any signs of active infection at this time which is great news. No fevers, chills, nausea, vomiting, or diarrhea. [name], [name] ([id number]) 02/18/2021 upon evaluation today patient's wound is actually showing signs of good improvement. I am very pleased with where things stand  currently. I do not see any signs of active infection which is great news and overall I feel like she is making good progress. The patient likewise is happy that things are doing so well and that this is measuring somewhat smaller. 02/25/2021 upon evaluation today patient actually appears to be doing quite well in regard to her wounds. She has been tolerating the  dressing changes without complication. Fortunately there does not appear to be any signs of active infection which is great news and overall I am extremely pleased with where things stand. No fevers, chills, nausea, vomiting, or diarrhea. She does have a lot of edema I really do think she needs compression socks to be worn in order to prevent things from causing additional issues for her currently. Especially on the right leg she should be wearing compression socks all the time to be honest. 03/04/2021 upon evaluation today patient's leg ulcer actually appears to be doing pretty well which is great news. There does not appear to be any significant change but overall the appearance is better even though the size is not necessarily reflecting a great improvement. Fortunately there does not appear to be any signs of active infection. No fevers, chills, nausea, vomiting, or diarrhea. 03/12/2021 upon evaluation today patient appears to be doing well with regard to her wounds. She has been tolerating the dressing changes without complication. The good news is that the wound on her left lateral leg is doing much better and is showing signs of good improvement. Overall her swelling is controlled well as well. She does need some compression socks she plans to go Jefferey socks next week. 03/18/2021 on evaluation today patient's wound actually is showing signs of good improvement. She does have a little bit of slough this can require some sharp debridement today. 03/25/2021 upon evaluation today patient appears to be doing well with regard to her wound on the left lateral leg. She has been tolerating the dressing changes without complication. Fortunately there does not appear to be any signs of active infection at this time. No fever chills no 04/01/2021 upon evaluation today patient's wound is showing signs of improvement and overall very pleased with where things stand at this point. There is no evidence of active  infection which is great news as well and in general I think that she is making great progress. 04/09/2021 upon evaluation today patient appears to be doing well with regard to her ankle ulcer. She is making good progress currently which is great news. There does not appear to be any signs of infection also excellent news. In general I am extremely pleased with where things stand today. No fevers, chills, nausea, vomiting, or diarrhea. 04/23/2021 upon inspection today patient appears to be doing quite well with regard to her leg ulcer. She has been tolerating the dressing changes without complication. She is can require some sharp debridement today but overall seems to be doing excellent. 05/06/2021 upon evaluation today patient unfortunately appears to be doing poorly overall in regard to her swelling. She is actually significantly more swollen than last week despite the fact that her wound is doing better this is not a good trend. I think that she probably needs to see her cardiologist ASAP and I discussed that with her today. This includes the fact that honestly I think she probably is getting need an increase in her diuretic or something to try to clear away some of the excess fluid that she is experiencing at the moment. She is  not been doing her pumps even twice a day but again right now more concerned that if she were to do the pump she would fluid overload her lungs causing other issues as far as her breathing is concerned. Obviously I think she needs to contact them and move up the appointment for the 12th of something sooner 2 weeks is a bit too long to wait with how swollen she has. 05/14/2021 upon evaluation today patient's wound actually showing signs of being a little bit smaller compared to previous. She has been making good progress however. Fortunately there does not appear to be any evidence of active infection which is great. Overall I am extremely pleased in that regard. Nonetheless I am  still concerned that she is having quite a bit of issue as far as the swelling is concerned she has been placed on fluid restriction as well as an increase in the furosemide by her doctor. We will see how things progress. 05/21/2021 upon evaluation today patient appears to be doing well with regard to her wound in general. She has been tolerating the dressing changes without complication. With that being said she has been using silver cell for a while and while this was showing signs of improvement it is now gotten to the point where this has slowed down quite significantly. I do believe that switching to a different dressing may be beneficial for her today. 05/28/2021 upon evaluation today patient actually appears to be doing quite well in regard to her wound. In fact there is really not any need for sharp debridement today based on what I see. The Hydrofera Blue is doing great and overall I think that she is managing quite nicely with the compression wrapping. Her edema is still down quite a bit with the wrapping in place. 06/11/2021 upon evaluation today patient appears to be doing well with regard to her leg ulcer. She has been tolerating the dressing changes without complication. Fortunately there does not appear to be any signs of active infection at this time which is great news. No fevers, chills, nausea, vomiting, or diarrhea. She is going require some sharp debridement today. 10/11; left leg ulcer much better looking and with improved surface area. She is using Hydrofera Blue under 3 layer compression. She does not have any stocking on the right leg which in itself has very significant nonpitting edema 06/25/2021 upon evaluation today patient appears to be doing well with regard to the wound on her left leg. She has been tolerating the dressing changes without complication and overall I am extremely pleased with where things stand today. I do not see any evidence of infection which is great  news. 07/02/2021 upon evaluation today patient's wound actually showing signs of excellent improvement and actually very pleased with where we stand today and the overall appearance. Fortunately there does not appear to be any signs of active infection. No fevers, chills, nausea, vomiting, or diarrhea. 07/09/2021 upon evaluation today patient appears to be doing better in regard to her wound this is measuring smaller and she is headed in the appropriate direction based on what I am seeing currently. I do not see any evidence of active infection systemically which is great news. 07/16/2021 upon evaluation today patient appears to be doing pretty well currently in regard to her leg ulcer. This is measuring smaller and looking much better this is great news. Fortunately there does not appear to be any evidence of active infection systemically which is great news as well.  07/23/2021 upon evaluation today patient's wound is actually showing signs of good improvement this is definitely measuring smaller. I do not see any signs of infection which is great news and overall very pleased in that regard. Overall I think that she is doing well with the Briell Paulette, [place] ([id number]) [place]. 07/30/2020 upon evaluation today patient appears to be doing well in regard to her wound. She has been tolerating the dressing changes without complication think the Hydrofera Blue at this point is actually causing her to dry out a lot as far as the wound bed is concerned. Subsequently I think that we need to try to see what we can do to improve this. Fortunately there does not appear to be any evidence of active infection at this time which is great news. 08/06/2021 upon evaluation today patient appears to be doing well with regards to her wound. Is not measuring significantly smaller initial inspection but upon closer inspection she has a lot of new skin growth across the central portion of the wound. I think will  get very close to complete resolution. 08/13/2021 upon evaluation today patient appears to be doing well with regard to her wound. In fact this appears to be I believe healed although there is still some question as to whether it is completely so. I am concerned about the fact that to be honest she still has some evidence of dry skin that could be just trapping something underneath I still want to debride anything as I am afraid of causing some damage to the good skin for that reason I Georgina Peer probably monitor for 1 more week before completely closing everything out. 08/20/2021 upon evaluation today patient actually appears to be doing excellent. In fact she appears to be completely healed. This was the case last week as well we just wanted to make sure nothing reopened since she does not really have an ability to put on compression socks this is good to be something we need to make sure was well-healed. Nonetheless I think we have achieved that goal as of today. 12/27; this is a patient we discharged 2 weeks ago with wounds on her left lower leg laterally. She was supposed to get stockings and really did not get them. She developed blistering and reopening probably sometime late last week. She has 3 areas in the same area as previously. The mid calf dimensions on the left went from 39 to 51 cm today. She also has very significant edema on the right leg but as far as I am aware has not had wounds in this area 1/4; patient presents for follow-up. She has tolerated the compression wrap well. She has no issues or complaints today. 09/17/2021 upon evaluation today patient appears to be doing well with regard to her wounds. She has been tolerating the dressing changes without complication. Fortunately there does not appear to be any signs of active infection at this time. No fevers, chills, nausea, vomiting, or diarrhea. 10/01/2021 upon evaluation today patient appears to be doing well with regard to her  wound this is actually measuring better and looking smaller and I am very pleased in that regard. Fortunately I do not see any evidence of active infection locally nor systemically at this point. No fevers, chills, nausea, vomiting, or diarrhea. 10/08/2021 upon evaluation patient's wounds are actually showing some signs of improvement measuring a little bit smaller and looking a little bit better. Overall I do not think there is any need for sharp debridement today which  is good news. No fevers, chills, nausea, vomiting, or diarrhea. 10/14/2021 upon evaluation today patient appears to be doing well with regard to her wound. She has been tolerating the dressing changes without complication and overall I am extremely pleased with where things stand today. There is a little bit of film on the surface of the wounds but this was carefully cleaned away with just saline and gauze she really did not want me to do any sharp debridement today. 10/29/2021 upon evaluation today patient appears to be doing quite well in regard to her wounds. She is actually showing signs of excellent improvement which is great news and overall I feel like we are on the right track at this time. She does have a wound on both legs and she does need something compression wise when she heals for that reason we will get a go and see about ordering her bilateral juxta lite compression wraps. 11/05/2021 upon evaluation today patient appears to be doing well with regard to her wound. She has been tolerating the dressing changes without complication. Fortunately I do not see any evidence of active infection locally nor systemically at this time which is great news. No fevers, chills, nausea, vomiting, or diarrhea. Electronic Signature(s) Signed: 11/05/2021 3:11:16 PM By: Worthy Keeler PA-C Entered By: Worthy Keeler on 11/05/2021 15:11:16 JAYCELYNN, KNICKERBOCKER  ([id number]) -------------------------------------------------------------------------------- Physical Exam Details Patient Name: [name] Date of Service: 11/05/2021 2:45 PM Medical Record Number: [id number] Patient Account Number: [id number] Date of Birth/Sex: 23-Apr-1946 (76 y.o. F) Treating RN: Carlene Coria Primary Care Provider: [name] Other Clinician: Referring Provider: [name] Treating Provider/Extender: Skipper Cliche in Treatment: 54 Constitutional Obese and well-hydrated in no acute distress. Respiratory normal breathing without difficulty. Psychiatric this patient is able to make decisions and demonstrates good insight into disease process. Alert and Oriented x 3. pleasant and cooperative. Notes Upon inspection patient's wound bed showed evidence of good granulation and epithelization at this point. Fortunately I do not see any evidence of active infection locally or systemically which is great I do think the wounds on both legs are showing signs of excellent improvement and overall I am extremely pleased in this regard. Electronic Signature(s) Signed: 11/05/2021 3:11:37 PM By: Worthy Keeler PA-C Entered By: Worthy Keeler on 11/05/2021 15:11:37 [name] ([id number]) -------------------------------------------------------------------------------- Physician Orders Details Patient Name: [name] Date of Service: 11/05/2021 2:45 PM Medical Record Number: [id number] Patient Account Number: [id number] Date of Birth/Sex: [birth year] (76 y.o. F) Treating RN: Carlene Coria Primary Care Provider: [name] Other Clinician: Referring Provider: [name] Treating Provider/Extender: Skipper Cliche in Treatment: 7 Verbal / Phone Orders: No Diagnosis Coding ICD-10 Coding Code Description I89.0 Lymphedema, not elsewhere classified I87.332 Chronic venous hypertension (idiopathic) with ulcer and inflammation of left lower  extremity L97.828 Non-pressure chronic ulcer of other part of left lower leg with other specified severity Follow-up Appointments o Return Appointment in 1 week. o Nurse Visit as needed Bathing/ Shower/ Hygiene o May shower with wound dressing protected with water repellent cover or cast protector. o No tub bath. Anesthetic (Use 'Patient Medications' Section for Anesthetic Order Entry) o Lidocaine applied to wound bed Edema Control - Lymphedema / Segmental Compressive Device / Other Left Lower Extremity o Optional: One layer of unna paste to top of compression wrap (to act as an anchor). o Elevate, Exercise Daily and Avoid Standing for Long Periods of Time. o Elevate legs to the level of the heart and pump ankles as often  as possible o Elevate leg(s) parallel to the floor when sitting. o Compression Pump: Use compression pump on left lower extremity for 60 minutes, twice daily. - You may pump over wraps o Compression Pump: Use compression pump on right lower extremity for 60 minutes, twice daily. - You may pump over wraps Additional Orders / Instructions o Follow Nutritious Diet and Increase Protein Intake Wound Treatment Wound #3 - Lower Leg Wound Laterality: Left Cleanser: Soap and Water 1 x Per Week/30 Days Discharge Instructions: Gently cleanse wound with antibacterial soap, rinse and pat dry prior to dressing wounds Cleanser: Wound Cleanser 1 x Per Week/30 Days Discharge Instructions: Wash your hands with soap and water. Remove old dressing, discard into plastic bag and place into trash. Cleanse the wound with Wound Cleanser prior to applying a clean dressing using gauze sponges, not tissues or cotton balls. Do not scrub or use excessive force. Pat dry using gauze sponges, not tissue or cotton balls. Primary Dressing: Hydrofera Blue Ready Transfer Foam, 4x5 (in/in) 1 x Per Week/30 Days Discharge Instructions: Apply Hydrofera Blue Ready to wound bed as  directed Secondary Dressing: ABD Pad 5x9 (in/in) 1 x Per Week/30 Days Discharge Instructions: Cover with ABD pad Compression Wrap: Profore Lite LF 3 Multilayer Compression Bandaging System 1 x Per Week/30 Days Discharge Instructions: Apply 3 multi-layer wrap as prescribed. Compression Stockings: Circaid Juxta Lite Compression Wrap Left Leg Compression Amount: 30-40 mmHG Discharge Instructions: Apply Circaid Juxta Lite Compression Wrap as directed Wound #4 - Lower Leg Wound Laterality: Right, Lateral Cleanser: Soap and Water 1 x Per Week/30 Days [name], [name] ([id number]) Discharge Instructions: Gently cleanse wound with antibacterial soap, rinse and pat dry prior to dressing wounds Cleanser: Wound Cleanser 1 x Per Week/30 Days Discharge Instructions: Wash your hands with soap and water. Remove old dressing, discard into plastic bag and place into trash. Cleanse the wound with Wound Cleanser prior to applying a clean dressing using gauze sponges, not tissues or cotton balls. Do not scrub or use excessive force. Pat dry using gauze sponges, not tissue or cotton balls. Primary Dressing: Hydrofera Blue Ready Transfer Foam, 4x5 (in/in) 1 x Per Week/30 Days Discharge Instructions: Apply Hydrofera Blue Ready to wound bed as directed Secondary Dressing: ABD Pad 5x9 (in/in) 1 x Per Week/30 Days Discharge Instructions: Cover with ABD pad Compression Wrap: Profore Lite LF 3 Multilayer Compression Bandaging System 1 x Per Week/30 Days Discharge Instructions: Apply 3 multi-layer wrap as prescribed. Compression Stockings: Circaid Juxta Lite Compression Wrap Right Leg Compression Amount: 20-30 mmHG Discharge Instructions: Apply Circaid Juxta Lite Compression Wrap as directed Electronic Signature(s) Signed: 11/05/2021 4:37:47 PM By: Worthy Keeler PA-C Signed: 11/05/2021 4:37:51 PM By: Carlene Coria RN Entered By: Carlene Coria on 11/05/2021 15:08:50 [name]  ([id number]) -------------------------------------------------------------------------------- Problem List Details Patient Name: [name] Date of Service: 11/05/2021 2:45 PM Medical Record Number: [id number] Patient Account Number: [id number] Date of Birth/Sex: Jan 05, 1946 (76 y.o. F) Treating RN: Carlene Coria Primary Care Provider: [name] Other Clinician: Referring Provider: [name] Treating Provider/Extender: Skipper Cliche in Treatment: 41 Active Problems ICD-10 Encounter Code Description Active Date MDM Diagnosis I89.0 Lymphedema, not elsewhere classified 01/16/2021 No Yes I87.332 Chronic venous hypertension (idiopathic) with ulcer and inflammation of 01/16/2021 No Yes left lower extremity L97.828 Non-pressure chronic ulcer of other part of left lower leg with other 01/16/2021 No Yes specified severity Inactive Problems Resolved Problems Electronic Signature(s) Signed: 11/05/2021 2:52:48 PM By: Worthy Keeler PA-C Entered By: Worthy Keeler on 11/05/2021  14:52:48 YESENNIA, HIROTA ([id number]) -------------------------------------------------------------------------------- Progress Note Details Patient Name: [name], [name] Date of Service: 11/05/2021 2:45 PM Medical Record Number: [id number] Patient Account Number: [id number] Date of Birth/Sex: [birth year] (76 y.o. F) Treating RN: Carlene Coria Primary Care Provider: [name] Other Clinician: Referring Provider: [name] Treating Provider/Extender: Skipper Cliche in Treatment: 41 Subjective Chief Complaint Information obtained from Patient Left LE Ulcer History of Present Illness (HPI) 03/15/2020 upon evaluation today patient presents today for initial evaluation here in our clinic concerning a wound that is on the left posterior lower extremity. Unfortunately this has been giving the patient some discomfort at this point which she notes has been affected her pretty much daily. The patient does  have a history of venous insufficiency, hypertension, congestive heart failure, and a history of breast cancer. Fortunately there does not appear to be evidence of active infection at this time which is great news. No fevers, chills, nausea, vomiting, or diarrhea. Wound is not extremely large but does have some slough covering the surface of the wound. This is going require sharp debridement today. 03/15/2020 upon evaluation today patient appears to be doing fairly well in regard to her wound. She does have some slough noted on the surface of the wound currently but I do believe the Iodoflex has been beneficial over the past week. There is no signs of active infection at this time which is great news and overall very pleased with the progress. No fevers, chills, nausea, vomiting, or diarrhea. 04/02/2020 upon evaluation today patient appears to be doing a little better in regard to her wound size wise this is not tremendously smaller she does have some slough buildup on the surface of the wound. With that being said this is going require some sharp debridement today to clear away some of the necrotic debris. Fortunately there is no evidence of active infection at this time. No fevers, chills, nausea, vomiting, or diarrhea. 04/10/2020 on evaluation today patient appears to be doing well with regard to her ulcer which is measuring somewhat smaller today. She actually has more granulation tissue noted which is also good news is no need for sharp debridement today. Fortunately there is no evidence of infection either which is also excellent. No fevers, chills, nausea, vomiting, or diarrhea. 04/26/2020 on evaluation today patient's wound actually is showing signs of good improvement which is great news. There does not appear to be any evidence of active infection. I do believe she is tolerating the collagen at this point. 05/03/2020 upon evaluation today patient's wound actually showed signs of good granulation at  this time there does not appear to be any evidence of active infection which is great news and overall very pleased with where things stand. With that being said I do believe that the patient is can require some sharp debridement today but fortunately nothing too significant. 05/11/20 on evaluation today patient appears to be doing well in regard to her leg ulcer. She's been tolerating the dressing changes without complication. Fortunately there is no signs of active infection at this time. Overall I'm very pleased with where things stand. 05/18/2020 upon evaluation today patient's wound is showing signs of improvement is very dry and I think the collagen for the most part just dried to the wound bed. I am going to clean this away with sharp debridement in order to get this under control in my opinion. 05/25/2020 upon evaluation today patient actually appears to be doing well in regard to her leg ulcer. In fact  upon inspection it appears she could potentially be completely healed but that is not guaranteed based on what we are seeing. There does not appear to be any signs of active infection which is great news. 05/31/2020 on evaluation today patient appears to be doing well with regard to her wounds. She in fact appears to be almost completely healed on the left leg there is just a very small opening remaining point 1 06/08/2020 upon evaluation today patient appears to be doing excellent in regard to her leg ulcer on the left in fact it appears to be that she is completely healed as of today. I see no signs of active infection at this time which is great news. No fevers, chills, nausea, vomiting, or diarrhea. READMISSION 01/16/2021 Patient that we have had in this clinic with a wound on the left posterior calf in the summer 2021 extending into October. She was felt to have chronic venous insufficiency. Per the patient's description she was discharged with what sounds like external compression stockings  although she could not afford the "$75 per leg". She therefore did not get anything. Apparently her wound that she has currently is been in existence since January. She has been followed at vein and vascular with Unna boots and I think calcium alginate. She had ABIs done on 06/25/2020 that showed an noncompressible ABI on the right leg and the left leg but triphasic waveforms with good great toe pressures and a TBI of 0.90 on the right and 0.91 on the left Surprisingly the patient also had venous reflux studies that were really unremarkable. This included no evidence of DVTs or SVTs but there was no evidence of deep venous insufficiency or superficial venous insufficiency in the greater saphenous or short saphenous veins bilaterally. I would wonder about more central venous issues or perhaps this is all lymphedema 01/25/2021 upon evaluation today patient appears to be doing decently well and guard to her wound. The good news is the Iodoflex that is job she saw Dr. Dellia Nims last week for readmission and to be honest I think that this has done extremely well over that week. I do believe that she can tolerate the sharp debridement today which will be good. I think that cleaning off the wound will likely allow [place] to be able to use a different type of dressing I do not think the Iodoflex will even be necessary based on how clean the wound looks. Fortunately there is no sign of active infection at this time which is great news. No fevers, chills, nausea, vomiting, or diarrhea. [name], [name] ([id number]) 02/12/2021 upon evaluation today patient appears to be doing well with regard to her leg ulcer. We did switch to alginate when she came for nurse visit I think is doing much better for her. Fortunately there does not appear to be any signs of active infection at this time which is great news. No fevers, chills, nausea, vomiting, or diarrhea. 02/18/2021 upon evaluation today patient's wound is actually showing  signs of good improvement. I am very pleased with where things stand currently. I do not see any signs of active infection which is great news and overall I feel like she is making good progress. The patient likewise is happy that things are doing so well and that this is measuring somewhat smaller. 02/25/2021 upon evaluation today patient actually appears to be doing quite well in regard to her wounds. She has been tolerating the dressing changes without complication. Fortunately there does not appear to be  any signs of active infection which is great news and overall I am extremely pleased with where things stand. No fevers, chills, nausea, vomiting, or diarrhea. She does have a lot of edema I really do think she needs compression socks to be worn in order to prevent things from causing additional issues for her currently. Especially on the right leg she should be wearing compression socks all the time to be honest. 03/04/2021 upon evaluation today patient's leg ulcer actually appears to be doing pretty well which is great news. There does not appear to be any significant change but overall the appearance is better even though the size is not necessarily reflecting a great improvement. Fortunately there does not appear to be any signs of active infection. No fevers, chills, nausea, vomiting, or diarrhea. 03/12/2021 upon evaluation today patient appears to be doing well with regard to her wounds. She has been tolerating the dressing changes without complication. The good news is that the wound on her left lateral leg is doing much better and is showing signs of good improvement. Overall her swelling is controlled well as well. She does need some compression socks she plans to go Jefferey socks next week. 03/18/2021 on evaluation today patient's wound actually is showing signs of good improvement. She does have a little bit of slough this can require some sharp debridement today. 03/25/2021 upon evaluation  today patient appears to be doing well with regard to her wound on the left lateral leg. She has been tolerating the dressing changes without complication. Fortunately there does not appear to be any signs of active infection at this time. No fever chills no 04/01/2021 upon evaluation today patient's wound is showing signs of improvement and overall very pleased with where things stand at this point. There is no evidence of active infection which is great news as well and in general I think that she is making great progress. 04/09/2021 upon evaluation today patient appears to be doing well with regard to her ankle ulcer. She is making good progress currently which is great news. There does not appear to be any signs of infection also excellent news. In general I am extremely pleased with where things stand today. No fevers, chills, nausea, vomiting, or diarrhea. 04/23/2021 upon inspection today patient appears to be doing quite well with regard to her leg ulcer. She has been tolerating the dressing changes without complication. She is can require some sharp debridement today but overall seems to be doing excellent. 05/06/2021 upon evaluation today patient unfortunately appears to be doing poorly overall in regard to her swelling. She is actually significantly more swollen than last week despite the fact that her wound is doing better this is not a good trend. I think that she probably needs to see her cardiologist ASAP and I discussed that with her today. This includes the fact that honestly I think she probably is getting need an increase in her diuretic or something to try to clear away some of the excess fluid that she is experiencing at the moment. She is not been doing her pumps even twice a day but again right now more concerned that if she were to do the pump she would fluid overload her lungs causing other issues as far as her breathing is concerned. Obviously I think she needs to contact them and  move up the appointment for the 12th of something sooner 2 weeks is a bit too long to wait with how swollen she has. 05/14/2021 upon evaluation  today patient's wound actually showing signs of being a little bit smaller compared to previous. She has been making good progress however. Fortunately there does not appear to be any evidence of active infection which is great. Overall I am extremely pleased in that regard. Nonetheless I am still concerned that she is having quite a bit of issue as far as the swelling is concerned she has been placed on fluid restriction as well as an increase in the furosemide by her doctor. We will see how things progress. 05/21/2021 upon evaluation today patient appears to be doing well with regard to her wound in general. She has been tolerating the dressing changes without complication. With that being said she has been using silver cell for a while and while this was showing signs of improvement it is now gotten to the point where this has slowed down quite significantly. I do believe that switching to a different dressing may be beneficial for her today. 05/28/2021 upon evaluation today patient actually appears to be doing quite well in regard to her wound. In fact there is really not any need for sharp debridement today based on what I see. The Hydrofera Blue is doing great and overall I think that she is managing quite nicely with the compression wrapping. Her edema is still down quite a bit with the wrapping in place. 06/11/2021 upon evaluation today patient appears to be doing well with regard to her leg ulcer. She has been tolerating the dressing changes without complication. Fortunately there does not appear to be any signs of active infection at this time which is great news. No fevers, chills, nausea, vomiting, or diarrhea. She is going require some sharp debridement today. 10/11; left leg ulcer much better looking and with improved surface area. She is using  Hydrofera Blue under 3 layer compression. She does not have any stocking on the right leg which in itself has very significant nonpitting edema 06/25/2021 upon evaluation today patient appears to be doing well with regard to the wound on her left leg. She has been tolerating the dressing changes without complication and overall I am extremely pleased with where things stand today. I do not see any evidence of infection which is great news. 07/02/2021 upon evaluation today patient's wound actually showing signs of excellent improvement and actually very pleased with where we stand today and the overall appearance. Fortunately there does not appear to be any signs of active infection. No fevers, chills, nausea, vomiting, or diarrhea. 07/09/2021 upon evaluation today patient appears to be doing better in regard to her wound this is measuring smaller and she is headed in the appropriate direction based on what I am seeing currently. I do not see any evidence of active infection systemically which is great news. 07/16/2021 upon evaluation today patient appears to be doing pretty well currently in regard to her leg ulcer. This is measuring smaller and looking much better this is great news. Fortunately there does not appear to be any evidence of active infection systemically which is great news [name], [name] ([id number]) as well. 07/23/2021 upon evaluation today patient's wound is actually showing signs of good improvement this is definitely measuring smaller. I do not see any signs of infection which is great news and overall very pleased in that regard. Overall I think that she is doing well with the [facility]. 07/30/2020 upon evaluation today patient appears to be doing well in regard to her wound. She has been tolerating the dressing changes without complication  think the Hydrofera Blue at this point is actually causing her to dry out a lot as far as the wound bed is concerned. Subsequently  I think that we need to try to see what we can do to improve this. Fortunately there does not appear to be any evidence of active infection at this time which is great news. 08/06/2021 upon evaluation today patient appears to be doing well with regards to her wound. Is not measuring significantly smaller initial inspection but upon closer inspection she has a lot of new skin growth across the central portion of the wound. I think will get very close to complete resolution. 08/13/2021 upon evaluation today patient appears to be doing well with regard to her wound. In fact this appears to be I believe healed although there is still some question as to whether it is completely so. I am concerned about the fact that to be honest she still has some evidence of dry skin that could be just trapping something underneath I still want to debride anything as I am afraid of causing some damage to the good skin for that reason I Georgina Peer probably monitor for 1 more week before completely closing everything out. 08/20/2021 upon evaluation today patient actually appears to be doing excellent. In fact she appears to be completely healed. This was the case last week as well we just wanted to make sure nothing reopened since she does not really have an ability to put on compression socks this is good to be something we need to make sure was well-healed. Nonetheless I think we have achieved that goal as of today. 12/27; this is a patient we discharged 2 weeks ago with wounds on her left lower leg laterally. She was supposed to get stockings and really did not get them. She developed blistering and reopening probably sometime late last week. She has 3 areas in the same area as previously. The mid calf dimensions on the left went from 39 to 51 cm today. She also has very significant edema on the right leg but as far as I am aware has not had wounds in this area 1/4; patient presents for follow-up. She has tolerated the  compression wrap well. She has no issues or complaints today. 09/17/2021 upon evaluation today patient appears to be doing well with regard to her wounds. She has been tolerating the dressing changes without complication. Fortunately there does not appear to be any signs of active infection at this time. No fevers, chills, nausea, vomiting, or diarrhea. 10/01/2021 upon evaluation today patient appears to be doing well with regard to her wound this is actually measuring better and looking smaller and I am very pleased in that regard. Fortunately I do not see any evidence of active infection locally nor systemically at this point. No fevers, chills, nausea, vomiting, or diarrhea. 10/08/2021 upon evaluation patient's wounds are actually showing some signs of improvement measuring a little bit smaller and looking a little bit better. Overall I do not think there is any need for sharp debridement today which is good news. No fevers, chills, nausea, vomiting, or diarrhea. 10/14/2021 upon evaluation today patient appears to be doing well with regard to her wound. She has been tolerating the dressing changes without complication and overall I am extremely pleased with where things stand today. There is a little bit of film on the surface of the wounds but this was carefully cleaned away with just saline and gauze she really did not want me to  do any sharp debridement today. 10/29/2021 upon evaluation today patient appears to be doing quite well in regard to her wounds. She is actually showing signs of excellent improvement which is great news and overall I feel like we are on the right track at this time. She does have a wound on both legs and she does need something compression wise when she heals for that reason we will get a go and see about ordering her bilateral juxta lite compression wraps. 11/05/2021 upon evaluation today patient appears to be doing well with regard to her wound. She has been tolerating the  dressing changes without complication. Fortunately I do not see any evidence of active infection locally nor systemically at this time which is great news. No fevers, chills, nausea, vomiting, or diarrhea. Objective Constitutional Obese and well-hydrated in no acute distress. Vitals Time Taken: 2:49 PM, Height: 69 in, Weight: 244 lbs, BMI: 36, Temperature: 98.7 F, Pulse: 67 bpm, Respiratory Rate: 18 breaths/min, Blood Pressure: 168/90 mmHg. Respiratory normal breathing without difficulty. Psychiatric this patient is able to make decisions and demonstrates good insight into disease process. Alert and Oriented x 3. pleasant and cooperative. [place], [name] ([id number]) General Notes: Upon inspection patient's wound bed showed evidence of good granulation and epithelization at this point. Fortunately I do not see any evidence of active infection locally or systemically which is great I do think the wounds on both legs are showing signs of excellent improvement and overall I am extremely pleased in this regard. Integumentary (Hair, Skin) Wound #3 status is Open. Original cause of wound was Gradually Appeared. The date acquired was: 08/15/2021. The wound has been in treatment 9 weeks. The wound is located on the Left Lower Leg. The wound measures 6cm length x 3cm width x 0.1cm depth; 14.137cm^2 area and 1.414cm^3 volume. There is Fat Layer (Subcutaneous Tissue) exposed. There is no tunneling or undermining noted. There is a medium amount of serosanguineous drainage noted. There is large (67-100%) red, hyper - granulation within the wound bed. There is a small (1-33%) amount of necrotic tissue within the wound bed including Adherent Slough. Wound #4 status is Open. Original cause of wound was Gradually Appeared. The date acquired was: 10/27/2021. The wound has been in treatment 1 weeks. The wound is located on the Right,Lateral Lower Leg. The wound measures 2.5cm length x 2.5cm width x 0.1cm depth;  4.909cm^2 area and 0.491cm^3 volume. There is Fat Layer (Subcutaneous Tissue) exposed. There is no tunneling or undermining noted. There is a medium amount of serosanguineous drainage noted. There is medium (34-66%) red granulation within the wound bed. There is a medium (34-66%) amount of necrotic tissue within the wound bed including Adherent Slough. 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 There was a Three Layer Compression Therapy Procedure by Carlene Coria, RN. Post procedure Diagnosis Wound #3: Same as Pre-Procedure Plan Follow-up Appointments: Return Appointment in 1 week. Nurse Visit as needed Bathing/ Shower/ Hygiene: May shower with wound dressing protected with water repellent cover or cast protector. No tub bath. Anesthetic (Use 'Patient Medications' Section for Anesthetic Order Entry): Lidocaine applied to wound bed Edema Control - Lymphedema / Segmental Compressive Device / Other: Optional: One layer of unna paste to top of compression wrap (  to act as an anchor). Elevate, Exercise Daily and Avoid Standing for Long Periods of Time. Elevate legs to the level of the heart and pump ankles as often as possible Elevate leg(s) parallel to the floor when sitting. Compression Pump: Use compression pump on left lower extremity for 60 minutes, twice daily. - You may pump over wraps Compression Pump: Use compression pump on right lower extremity for 60 minutes, twice daily. - You may pump over wraps Additional Orders / Instructions: Follow Nutritious Diet and Increase Protein Intake WOUND #3: - Lower Leg Wound Laterality: Left Cleanser: Soap and Water 1 x Per Week/30  Days Discharge Instructions: Gently cleanse wound with antibacterial soap, rinse and pat dry prior to dressing wounds Cleanser: Wound Cleanser 1 x Per Week/30 Days Discharge Instructions: Wash your hands with soap and water. Remove old dressing, discard into plastic bag and place into trash. Cleanse the wound with Wound Cleanser prior to applying a clean dressing using gauze sponges, not tissues or cotton balls. Do not scrub or use excessive force. Pat dry using gauze sponges, not tissue or cotton balls. Primary Dressing: Hydrofera Blue Ready Transfer Foam, 4x5 (in/in) 1 x Per Week/30 Days [name], [name] ([id number]) Discharge Instructions: Apply Hydrofera Blue Ready to wound bed as directed Secondary Dressing: ABD Pad 5x9 (in/in) 1 x Per Week/30 Days Discharge Instructions: Cover with ABD pad Compression Wrap: Profore Lite LF 3 Multilayer Compression Bandaging System 1 x Per Week/30 Days Discharge Instructions: Apply 3 multi-layer wrap as prescribed. Compression Stockings: Circaid Juxta Lite Compression Wrap Compression Amount: 30-40 mmHg (left) Discharge Instructions: Apply Circaid Juxta Lite Compression Wrap as directed WOUND #4: - Lower Leg Wound Laterality: Right, Lateral Cleanser: Soap and Water 1 x Per Week/30 Days Discharge Instructions: Gently cleanse wound with antibacterial soap, rinse and pat dry prior to dressing wounds Cleanser: Wound Cleanser 1 x Per Week/30 Days Discharge Instructions: Wash your hands with soap and water. Remove old dressing, discard into plastic bag and place into trash. Cleanse the wound with Wound Cleanser prior to applying a clean dressing using gauze sponges, not tissues or cotton balls. Do not scrub or use excessive force. Pat dry using gauze sponges, not tissue or cotton balls. Primary Dressing: Hydrofera Blue Ready Transfer Foam, 4x5 (in/in) 1 x Per Week/30 Days Discharge Instructions: Apply Hydrofera Blue Ready to wound bed as directed Secondary  Dressing: ABD Pad 5x9 (in/in) 1 x Per Week/30 Days Discharge Instructions: Cover with ABD pad Compression Wrap: Profore Lite LF 3 Multilayer Compression Bandaging System 1 x Per Week/30 Days Discharge Instructions: Apply 3 multi-layer wrap as prescribed. Compression Stockings: Circaid Juxta Lite Compression Wrap Compression Amount: 20-30 mmHg (right) Discharge Instructions: Apply Circaid Juxta Lite Compression Wrap as directed 1. Would recommend that we going continue with the wound care measures as before and the patient is in agreement with plan this includes the use of the Hydrofera Blue to all wounds which I think is doing a great job here. 2. I am also can recommend that we have the patient continue with the ABD pad to cover. 3. I am also going to suggest the patient continue with the 3 layer compression wraps bilaterally. Obviously I think she needs to continue doing the patient should also continue to elevate her legs much as possible. We will see patient back for reevaluation in 1 week here in the clinic. If anything worsens or changes patient will contact our office for additional recommendations. Electronic Signature(s) Signed: 11/05/2021 3:13:03 PM By: Worthy Keeler  PA-C Entered By: Worthy Keeler on 11/05/2021 15:13:03 [name] ([id number]) -------------------------------------------------------------------------------- SuperBill Details Patient Name: [name] Date of Service: 11/05/2021 Medical Record Number: [id number] Patient Account Number: [id number] Date of Birth/Sex: 09-14-1945 (76 y.o. 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 Physician Procedures CPT4 Code Description: 5797282 [id number] - WC PHYS LEVEL 4 - EST PT Modifier: Quantity: 1 CPT4 Code Description: ICD-10 Diagnosis Description I89.0 Lymphedema, not elsewhere classified I87.332 Chronic venous hypertension (idiopathic) with ulcer and inflammation of le L97.828 Non-pressure chronic ulcer of other part of left lower leg with  other spec Modifier: ft lower extremity ified severity Quantity: Electronic Signature(s) Signed: 11/05/2021 3:14:14 PM By: Worthy Keeler PA-C Entered By: Worthy Keeler on 11/05/2021 15:14:14

## 2021-11-12 ENCOUNTER — Other Ambulatory Visit: Payer: Self-pay

## 2021-11-12 ENCOUNTER — Encounter: Payer: Medicare Other | Attending: Physician Assistant | Admitting: Physician Assistant

## 2021-11-12 DIAGNOSIS — I872 Venous insufficiency (chronic) (peripheral): Secondary | ICD-10-CM | POA: Diagnosis not present

## 2021-11-12 DIAGNOSIS — I87332 Chronic venous hypertension (idiopathic) with ulcer and inflammation of left lower extremity: Secondary | ICD-10-CM | POA: Diagnosis not present

## 2021-11-12 DIAGNOSIS — I890 Lymphedema, not elsewhere classified: Secondary | ICD-10-CM | POA: Insufficient documentation

## 2021-11-12 DIAGNOSIS — L97822 Non-pressure chronic ulcer of other part of left lower leg with fat layer exposed: Secondary | ICD-10-CM | POA: Diagnosis not present

## 2021-11-12 DIAGNOSIS — I509 Heart failure, unspecified: Secondary | ICD-10-CM | POA: Diagnosis not present

## 2021-11-12 DIAGNOSIS — I110 Hypertensive heart disease with heart failure: Secondary | ICD-10-CM | POA: Diagnosis not present

## 2021-11-12 NOTE — Progress Notes (Addendum)
[name], [name] ([id number]) Visit Report for 11/12/2021 Arrival Information Details Patient Name: [name], [name] Date of Service: 11/12/2021 2:45 PM Medical Record Number: [id number] Patient Account Number: [id number] Date of Birth/Sex: 1946/06/21 (76 y.o. F) Treating RN: Carlene Coria Primary Care Luretta Everly: [name] Other Clinician: Referring Pegi Milazzo: [name] Treating Mateus Rewerts/Extender: Skipper Cliche in Treatment: 42 Visit Information History Since Last Visit All ordered tests and consults were completed: No Patient Arrived: Wheel Chair Added or deleted any medications: No Arrival Time: 14:36 Any new allergies or adverse reactions: No Accompanied By: self Had a fall or experienced change in No Transfer Assistance: None activities of daily living that may affect Patient Identification Verified: Yes risk of falls: Secondary Verification Process Completed: Yes Signs or symptoms of abuse/neglect since last visito No Patient Requires Transmission-Based Precautions: No Hospitalized since last visit: No Patient Has Alerts: Yes Implantable device outside of the clinic excluding No Patient Alerts: NOT DIABETIC cellular tissue based products placed in the center since last visit: Has Dressing in Place as Prescribed: Yes Pain Present Now: No Electronic Signature(s) Signed: 11/12/2021 4:20:37 PM By: Carlene Coria RN Entered By: Carlene Coria on 11/12/2021 14:45:16 [name] ([id number]) -------------------------------------------------------------------------------- Clinic Level of Care Assessment Details Patient Name: [name] Date of Service: 11/12/2021 2:45 PM Medical Record Number: [id number] Patient Account Number: [id number] Date of Birth/Sex: 1946-07-27 (76 y.o. F) Treating RN: Carlene Coria Primary Care Janaya Broy: [name] Other Clinician: Referring Mariene Dickerman: [name] Treating Jamiria Langill/Extender: Skipper Cliche in Treatment: 42 Clinic Level  of Care Assessment Items TOOL 4 Quantity Score '[]'$  - Use when only an EandM is performed on FOLLOW-UP visit 0 ASSESSMENTS - Nursing Assessment / Reassessment '[]'$  - Reassessment of Co-morbidities (includes updates in patient status) 0 '[]'$  - 0 Reassessment of Adherence to Treatment Plan ASSESSMENTS - Wound and Skin Assessment / Reassessment '[]'$  - Simple Wound Assessment / Reassessment - one wound 0 '[]'$  - 0 Complex Wound Assessment / Reassessment - multiple wounds '[]'$  - 0 Dermatologic / Skin Assessment (not related to wound area) ASSESSMENTS - Focused Assessment '[]'$  - Circumferential Edema Measurements - multi extremities 0 '[]'$  - 0 Nutritional Assessment / Counseling / Intervention '[]'$  - 0 Lower Extremity Assessment (monofilament, tuning fork, pulses) '[]'$  - 0 Peripheral Arterial Disease Assessment (using hand held doppler) ASSESSMENTS - Ostomy and/or Continence Assessment and Care '[]'$  - Incontinence Assessment and Management 0 '[]'$  - 0 Ostomy Care Assessment and Management (repouching, etc.) PROCESS - Coordination of Care '[]'$  - Simple Patient / Family Education for ongoing care 0 '[]'$  - 0 Complex (extensive) Patient / Family Education for ongoing care '[]'$  - 0 Staff obtains Programmer, systems, Records, Test Results / Process Orders '[]'$  - 0 Staff telephones HHA, Nursing Homes / Clarify orders / etc '[]'$  - 0 Routine Transfer to another Facility (non-emergent condition) '[]'$  - 0 Routine Hospital Admission (non-emergent condition) '[]'$  - 0 New Admissions / Biomedical engineer / Ordering NPWT, Apligraf, etc. '[]'$  - 0 Emergency Hospital Admission (emergent condition) '[]'$  - 0 Simple Discharge Coordination '[]'$  - 0 Complex (extensive) Discharge Coordination PROCESS - Special Needs '[]'$  - Pediatric / Minor Patient Management 0 '[]'$  - 0 Isolation Patient Management '[]'$  - 0 Hearing / Language / Visual special needs '[]'$  - 0 Assessment of Community assistance (transportation, D/C planning, etc.) '[]'$  - 0 Additional  assistance / Altered mentation '[]'$  - 0 Support Surface(s) Assessment (bed, cushion, seat, etc.) INTERVENTIONS - Wound Cleansing / Measurement Sam, Karman ([id number]) '[]'$  - 0 Simple Wound Cleansing - one wound '[]'$  -  0 Complex Wound Cleansing - multiple wounds '[]'$  - 0 Wound Imaging (photographs - any number of wounds) '[]'$  - 0 Wound Tracing (instead of photographs) '[]'$  - 0 Simple Wound Measurement - one wound '[]'$  - 0 Complex Wound Measurement - multiple wounds INTERVENTIONS - Wound Dressings '[]'$  - Small Wound Dressing one or multiple wounds 0 '[]'$  - 0 Medium Wound Dressing one or multiple wounds '[]'$  - 0 Large Wound Dressing one or multiple wounds '[]'$  - 0 Application of Medications - topical '[]'$  - 0 Application of Medications - injection INTERVENTIONS - Miscellaneous '[]'$  - External ear exam 0 '[]'$  - 0 Specimen Collection (cultures, biopsies, blood, body fluids, etc.) '[]'$  - 0 Specimen(s) / Culture(s) sent or taken to Lab for analysis '[]'$  - 0 Patient Transfer (multiple staff / Civil Service fast streamer / Similar devices) '[]'$  - 0 Simple Staple / Suture removal (25 or less) '[]'$  - 0 Complex Staple / Suture removal (26 or more) '[]'$  - 0 Hypo / Hyperglycemic Management (close monitor of Blood Glucose) '[]'$  - 0 Ankle / Brachial Index (ABI) - do not check if billed separately '[]'$  - 0 Vital Signs Has the patient been seen at the hospital within the last three years: Yes Total Score: 0 Level Of Care: ____ Electronic Signature(s) Signed: 11/12/2021 4:20:37 PM By: Carlene Coria RN Entered By: Carlene Coria on 11/12/2021 15:22:57 [name] ([id number]) -------------------------------------------------------------------------------- Encounter Discharge Information Details Patient Name: [name] Date of Service: 11/12/2021 2:45 PM Medical Record Number: [id number] Patient Account Number: [id number] Date of Birth/Sex: [birth year] (76 y.o. F) Treating RN: Carlene Coria Primary Care Melika Reder: [name]  Other Clinician: Referring Esabella Stockinger: [name] Treating Levi Crass/Extender: Skipper Cliche in Treatment: 42 Encounter Discharge Information Items Post Procedure Vitals Discharge Condition: Stable Temperature (F): 98.2 Ambulatory Status: Ambulatory Pulse (bpm): 66 Discharge Destination: Home Respiratory Rate (breaths/min): 18 Transportation: Private Auto Blood Pressure (mmHg): 190/88 Accompanied By: self Schedule Follow-up Appointment: Yes Clinical Summary of Care: Patient Declined Electronic Signature(s) Signed: 11/12/2021 4:20:37 PM By: Carlene Coria RN Entered By: Carlene Coria on 11/12/2021 15:24:45 [name] ([id number]) -------------------------------------------------------------------------------- Lower Extremity Assessment Details Patient Name: [name] Date of Service: 11/12/2021 2:45 PM Medical Record Number: [id number] Patient Account Number: [id number] Date of Birth/Sex: 08-19-1946 (76 y.o. F) Treating RN: Carlene Coria Primary Care Moses Odoherty: [name] Other Clinician: Referring Lealon Vanputten: [name] Treating Bristal Steffy/Extender: Skipper Cliche in Treatment: 42 Edema Assessment Assessed: [Left: No] [Right: No] Edema: [Left: Yes] [Right: Yes] Calf Left: Right: Point of Measurement: 31 cm From Medial Instep 35 cm 39 cm Ankle Left: Right: Point of Measurement: 10 cm From Medial Instep 25 cm 25 cm Vascular Assessment Pulses: Dorsalis Pedis Palpable: [Left:Yes] [Right:Yes] Electronic Signature(s) Signed: 11/12/2021 4:20:37 PM By: Carlene Coria RN Entered By: Carlene Coria on 11/12/2021 14:58:30 [name] ([id number]) -------------------------------------------------------------------------------- Multi Wound Chart Details Patient Name: [name] Date of Service: 11/12/2021 2:45 PM Medical Record Number: [id number] Patient Account Number: [id number] Date of Birth/Sex: 1945/10/05 (76 y.o. F) Treating RN: Carlene Coria Primary Care  Ina Scrivens: [name] Other Clinician: Referring Marlos Carmen: [name] Treating Patricie Geeslin/Extender: Skipper Cliche in Treatment: 42 Vital Signs Height(in): 69 Pulse(bpm): 34 Weight(lbs): 244 Blood Pressure(mmHg): 190/88 Body Mass Index(BMI): 36 Temperature(F): 98.2 Respiratory Rate(breaths/min): 18 Photos: [N/A:N/A] Wound Location: Left Lower Leg Right, Lateral Lower Leg N/A Wounding Event: Gradually Appeared Gradually Appeared N/A Primary Etiology: Lymphedema Venous Leg Ulcer N/A Comorbid History: Cataracts, Lymphedema, Sleep Cataracts, Lymphedema, Sleep N/A Apnea, Congestive Heart Failure, Apnea, Congestive Heart Failure, Hypertension, Osteoarthritis, Hypertension, Osteoarthritis, Neuropathy,  Received Neuropathy, Received Chemotherapy, Received Radiation Chemotherapy, Received Radiation Date Acquired: 08/15/2021 10/27/2021 N/A Weeks of Treatment: 10 2 N/A Wound Status: Open Open N/A Wound Recurrence: No No N/A Measurements L x W x D (cm) 2.3x2x0.1 3x2.5x0.1 N/A Area (cm) : 3.613 5.89 N/A Volume (cm) : 0.361 0.589 N/A % Reduction in Area: 94.00% -400.00% N/A % Reduction in Volume: 94.00% -399.20% N/A Classification: Full Thickness Without Exposed Full Thickness Without Exposed N/A Support Structures Support Structures Exudate Amount: Medium Medium N/A Exudate Type: Serosanguineous Serosanguineous N/A Exudate Color: red, brown red, brown N/A Granulation Amount: Large (67-100%) Medium (34-66%) N/A Granulation Quality: Red, Hyper-granulation Red N/A Necrotic Amount: Small (1-33%) Medium (34-66%) N/A Exposed Structures: Fat Layer (Subcutaneous Tissue): Fat Layer (Subcutaneous Tissue): N/A Yes Yes Fascia: No Fascia: No Tendon: No Tendon: No Muscle: No Muscle: No Joint: No Joint: No Bone: No Bone: No Epithelialization: None None N/A Treatment Notes Electronic Signature(s) Signed: 11/12/2021 4:20:37 PM By: Carlene Coria RN Entered By: Carlene Coria on 11/12/2021  14:58:56 [name] ([id number]) [name], [name] ([id number]) -------------------------------------------------------------------------------- Multi-Disciplinary Care Plan Details Patient Name: [name] Date of Service: 11/12/2021 2:45 PM Medical Record Number: [id number] Patient Account Number: [id number] Date of Birth/Sex: 1945-11-08 (76 y.o. F) Treating RN: Carlene Coria Primary Care Lyndsee Casa: [name] Other Clinician: Referring Mandolin Falwell: [name] Treating Petrea Fredenburg/Extender: Skipper Cliche in Treatment: 92 Active Inactive Electronic Signature(s) Signed: 11/12/2021 4:20:37 PM By: Carlene Coria RN Entered By: Carlene Coria on 11/12/2021 14:58:38 [name] ([id number]) -------------------------------------------------------------------------------- Pain Assessment Details Patient Name: [name] Date of Service: 11/12/2021 2:45 PM Medical Record Number: [id number] Patient Account Number: [id number] Date of Birth/Sex: 05-04-1946 (76 y.o. F) Treating RN: Carlene Coria Primary Care Benz Vandenberghe: [name] Other Clinician: Referring Ramie Palladino: [name] Treating Langley Flatley/Extender: Skipper Cliche in Treatment: 42 Active Problems Location of Pain Severity and Description of Pain Patient Has Paino No Site Locations Pain Management and Medication Current Pain Management: Electronic Signature(s) Signed: 11/12/2021 4:20:37 PM By: Carlene Coria RN Entered By: Carlene Coria on 11/12/2021 14:45:49 [name] ([id number]) -------------------------------------------------------------------------------- Patient/Caregiver Education Details Patient Name: [name] Date of Service: 11/12/2021 2:45 PM Medical Record Number: [id number] Patient Account Number: [id number] Date of Birth/Gender: August 29, 1946 (76 y.o. F) Treating RN: Carlene Coria Primary Care Physician: [name] Other Clinician: Referring Physician: [name] Treating  Physician/Extender: Skipper Cliche in Treatment: 71 Education Assessment Education Provided To: Patient Education Topics Provided Wound/Skin Impairment: Methods: Explain/Verbal Responses: State content correctly Electronic Signature(s) Signed: 11/12/2021 4:20:37 PM By: Carlene Coria RN Entered By: Carlene Coria on 11/12/2021 15:23:32 [name] ([id number]) -------------------------------------------------------------------------------- Wound Assessment Details Patient Name: [name] Date of Service: 11/12/2021 2:45 PM Medical Record Number: [id number] Patient Account Number: [id number] Date of Birth/Sex: 04-May-1946 (76 y.o. F) Treating RN: Carlene Coria Primary Care Jayquan Bradsher: [name] Other Clinician: Referring Warrick Llera: [name] Treating Orlandus Borowski/Extender: Skipper Cliche in Treatment: 42 Wound Status Wound Number: 3 Primary Lymphedema Etiology: Wound Location: Left Lower Leg Wound Open Wounding Event: Gradually Appeared Status: Date Acquired: 08/15/2021 Comorbid Cataracts, Lymphedema, Sleep Apnea, Congestive Heart Weeks Of Treatment: 10 History: Failure, Hypertension, Osteoarthritis, Neuropathy, Clustered Wound: No Received Chemotherapy, Received Radiation Photos Wound Measurements Length: (cm) 2.3 Width: (cm) 2 Depth: (cm) 0.1 Area: (cm) 3.613 Volume: (cm) 0.361 % Reduction in Area: 94% % Reduction in Volume: 94% Epithelialization: None Tunneling: No Undermining: No Wound Description Classification: Full Thickness Without Exposed Support Structu Exudate Amount: Medium Exudate Type: Serosanguineous Exudate Color: red, brown res Foul Odor After Cleansing: No Slough/Fibrino Yes Wound  Bed Granulation Amount: Large (67-100%) Exposed Structure Granulation Quality: Red, Hyper-granulation Fascia Exposed: No Necrotic Amount: Small (1-33%) Fat Layer (Subcutaneous Tissue) Exposed: Yes Necrotic Quality: Adherent Slough Tendon Exposed:  No Muscle Exposed: No Joint Exposed: No Bone Exposed: No Treatment Notes Wound #3 (Lower Leg) Wound Laterality: Left Cleanser Soap and Water Discharge Instruction: Gently cleanse wound with antibacterial soap, rinse and pat dry prior to dressing wounds Wound Cleanser [name], [name] ([id number]) Discharge Instruction: Wash your hands with soap and water. Remove old dressing, discard into plastic bag and place into trash. Cleanse the wound with Wound Cleanser prior to applying a clean dressing using gauze sponges, not tissues or cotton balls. Do not scrub or use excessive force. Pat dry using gauze sponges, not tissue or cotton balls. Peri-Wound Care Topical Primary Dressing Hydrofera Blue Ready Transfer Foam, 4x5 (in/in) Discharge Instruction: Apply Hydrofera Blue Ready to wound bed as directed Secondary Dressing ABD Pad 5x9 (in/in) Discharge Instruction: Cover with ABD pad Secured With Compression Wrap 3-LAYER WRAP - Profore Lite LF 3 Multilayer Compression Bandaging System Discharge Instruction: Apply 3 multi-layer wrap as prescribed. Compression Stockings Circaid Juxta Lite Compression Wrap Quantity: 1 Left Leg Compression Amount: 30-40 mmHg Discharge Instruction: Apply Circaid Juxta Lite Compression Wrap as directed Add-Ons Electronic Signature(s) Signed: 11/12/2021 4:20:37 PM By: Carlene Coria RN Entered By: Carlene Coria on 11/12/2021 14:56:46 [name] ([id number]) -------------------------------------------------------------------------------- Wound Assessment Details Patient Name: [name] Date of Service: 11/12/2021 2:45 PM Medical Record Number: [id number] Patient Account Number: [id number] Date of Birth/Sex: 04/23/1946 (76 y.o. F) Treating RN: Carlene Coria Primary Care Teddie Curd: [name] Other Clinician: Referring Ezri Fanguy: [name] Treating Alexyia Guarino/Extender: Skipper Cliche in Treatment: 42 Wound Status Wound Number: 4 Primary Venous  Leg Ulcer Etiology: Wound Location: Right, Lateral Lower Leg Wound Open Wounding Event: Gradually Appeared Status: Date Acquired: 10/27/2021 Comorbid Cataracts, Lymphedema, Sleep Apnea, Congestive Heart Weeks Of Treatment: 2 History: Failure, Hypertension, Osteoarthritis, Neuropathy, Clustered Wound: No Received Chemotherapy, Received Radiation Photos Wound Measurements Length: (cm) 3 Width: (cm) 2.5 Depth: (cm) 0.1 Area: (cm) 5.89 Volume: (cm) 0.589 % Reduction in Area: -400% % Reduction in Volume: -399.2% Epithelialization: None Tunneling: No Undermining: No Wound Description Classification: Full Thickness Without Exposed Support Structures Exudate Amount: Medium Exudate Type: Serosanguineous Exudate Color: red, brown Foul Odor After Cleansing: No Slough/Fibrino Yes Wound Bed Granulation Amount: Medium (34-66%) Exposed Structure Granulation Quality: Red Fascia Exposed: No Necrotic Amount: Medium (34-66%) Fat Layer (Subcutaneous Tissue) Exposed: Yes Necrotic Quality: Adherent Slough Tendon Exposed: No Muscle Exposed: No Joint Exposed: No Bone Exposed: No Treatment Notes Wound #4 (Lower Leg) Wound Laterality: Right, Lateral Cleanser Soap and Water Discharge Instruction: Gently cleanse wound with antibacterial soap, rinse and pat dry prior to dressing wounds Wound Cleanser [name], [name] ([id number]) Discharge Instruction: Wash your hands with soap and water. Remove old dressing, discard into plastic bag and place into trash. Cleanse the wound with Wound Cleanser prior to applying a clean dressing using gauze sponges, not tissues or cotton balls. Do not scrub or use excessive force. Pat dry using gauze sponges, not tissue or cotton balls. Peri-Wound Care Topical Primary Dressing Hydrofera Blue Ready Transfer Foam, 4x5 (in/in) Discharge Instruction: Apply Hydrofera Blue Ready to wound bed as directed Secondary Dressing ABD Pad 5x9 (in/in) Discharge  Instruction: Cover with ABD pad Secured With Compression Wrap 3-LAYER WRAP - Profore Lite LF 3 Multilayer Compression Bandaging System Discharge Instruction: Apply 3 multi-layer wrap as prescribed. Compression Stockings Circaid Juxta Lite Compression Wrap Quantity: 1 Right Leg  Compression Amount: 20-30 mmHg Discharge Instruction: Apply Circaid Juxta Lite Compression Wrap as directed Add-Ons Electronic Signature(s) Signed: 11/12/2021 4:20:37 PM By: Carlene Coria RN Entered By: Carlene Coria on 11/12/2021 14:57:11 [name] ([id number]) -------------------------------------------------------------------------------- [place] Details Patient Name: [name] Date of Service: 11/12/2021 2:45 PM Medical Record Number: [id number] Patient Account Number: [id number] Date of Birth/Sex: [birth year] (76 y.o. F) Treating RN: Carlene Coria Primary Care Angelik Walls: [name] Other Clinician: Referring Camdan Burdi: [name] Treating Phuong Hillary/Extender: Skipper Cliche in Treatment: 42 Vital Signs Time Taken: 14:45 Temperature (F): 98.2 Height (in): 69 Pulse (bpm): 66 Weight (lbs): 244 Respiratory Rate (breaths/min): 18 Body Mass Index (BMI): 36 Blood Pressure (mmHg): 190/88 Reference Range: 80 - 120 mg / dl Electronic Signature(s) Signed: 11/12/2021 4:20:37 PM By: Carlene Coria RN Entered By: Carlene Coria on 11/12/2021 14:45:37

## 2021-11-12 NOTE — Progress Notes (Addendum)
LOUIS, GAW ([id number]) Visit Report for 11/12/2021 Chief Complaint Document Details Patient Name: [name], [name] Date of Service: 11/12/2021 2:45 PM Medical Record Number: [id number] Patient Account Number: [id number] Date of Birth/Sex: 1946/04/08 (76 y.o. F) Treating RN: Carlene Coria Primary Care Provider: [name] Other Clinician: Referring Provider: [name] Treating Provider/Extender: Skipper Cliche in Treatment: 42 Information Obtained from: Patient Chief Complaint Left LE Ulcer Electronic Signature(s) Signed: 11/12/2021 2:32:37 PM By: Worthy Keeler PA-C Entered By: Worthy Keeler on 11/12/2021 14:32:37 [name] ([id number]) -------------------------------------------------------------------------------- Debridement Details Patient Name: [name] Date of Service: 11/12/2021 2:45 PM Medical Record Number: [id number] Patient Account Number: [id number] Date of Birth/Sex: [birth year] (76 y.o. F) Treating RN: Carlene Coria Primary Care Provider: [name] Other Clinician: Referring Provider: [name] Treating Provider/Extender: Skipper Cliche in Treatment: 42 Debridement Performed for Wound #4 Right,Lateral Lower Leg Assessment: Performed By: Physician Tommie Sams., PA-C Debridement Type: Debridement Severity of Tissue Pre Debridement: Fat layer exposed Level of Consciousness (Pre- Awake and Alert procedure): Pre-procedure Verification/Time Out Yes - 14:20 Taken: Start Time: 14:20 Pain Control: Lidocaine 4% Topical Solution Total Area Debrided (L x W): 3 (cm) x 2.5 (cm) = 7.5 (cm) Tissue and other material Viable, Non-Viable, Slough, Subcutaneous, Biofilm, Slough debrided: Level: Skin/Subcutaneous Tissue Debridement Description: Excisional Instrument: Curette Bleeding: Moderate Hemostasis Achieved: Pressure End Time: 15:23 Procedural Pain: 0 Post Procedural Pain: 0 Response to Treatment: Procedure was tolerated  well Level of Consciousness (Post- Awake and Alert procedure): Post Debridement Measurements of Total Wound Length: (cm) 3 Width: (cm) 2.5 Depth: (cm) 0.1 Volume: (cm) 0.589 Character of Wound/Ulcer Post Debridement: Improved Severity of Tissue Post Debridement: Fat layer exposed Post Procedure Diagnosis Same as Pre-procedure Electronic Signature(s) Signed: 11/12/2021 4:20:37 PM By: Carlene Coria RN Signed: 11/12/2021 7:37:05 PM By: Worthy Keeler PA-C Entered By: Carlene Coria on 11/12/2021 15:21:33 [name] ([id number]) -------------------------------------------------------------------------------- HPI Details Patient Name: [name] Date of Service: 11/12/2021 2:45 PM Medical Record Number: [id number] Patient Account Number: [id number] Date of Birth/Sex: [birth year] (76 y.o. F) Treating RN: Carlene Coria Primary Care Provider: [name] Other Clinician: Referring Provider: [name] Treating Provider/Extender: Skipper Cliche in Treatment: 42 History of Present Illness HPI Description: 03/15/2020 upon evaluation today patient presents today for initial evaluation here in our clinic concerning a wound that is on the left posterior lower extremity. Unfortunately this has been giving the patient some discomfort at this point which she notes has been affected her pretty much daily. The patient does have a history of venous insufficiency, hypertension, congestive heart failure, and a history of breast cancer. Fortunately there does not appear to be evidence of active infection at this time which is great news. No fevers, chills, nausea, vomiting, or diarrhea. Wound is not extremely large but does have some slough covering the surface of the wound. This is going require sharp debridement today. 03/15/2020 upon evaluation today patient appears to be doing fairly well in regard to her wound. She does have some slough noted on the surface of the wound currently but I do  believe the Iodoflex has been beneficial over the past week. There is no signs of active infection at this time which is great news and overall very pleased with the progress. No fevers, chills, nausea, vomiting, or diarrhea. 04/02/2020 upon evaluation today patient appears to be doing a little better in regard to her wound size wise this is not tremendously smaller she does have some slough buildup on the surface of the  wound. With that being said this is going require some sharp debridement today to clear away some of the necrotic debris. Fortunately there is no evidence of active infection at this time. No fevers, chills, nausea, vomiting, or diarrhea. 04/10/2020 on evaluation today patient appears to be doing well with regard to her ulcer which is measuring somewhat smaller today. She actually has more granulation tissue noted which is also good news is no need for sharp debridement today. Fortunately there is no evidence of infection either which is also excellent. No fevers, chills, nausea, vomiting, or diarrhea. 04/26/2020 on evaluation today patient's wound actually is showing signs of good improvement which is great news. There does not appear to be any evidence of active infection. I do believe she is tolerating the collagen at this point. 05/03/2020 upon evaluation today patient's wound actually showed signs of good granulation at this time there does not appear to be any evidence of active infection which is great news and overall very pleased with where things stand. With that being said I do believe that the patient is can require some sharp debridement today but fortunately nothing too significant. 05/11/20 on evaluation today patient appears to be doing well in regard to her leg ulcer. She's been tolerating the dressing changes without complication. Fortunately there is no signs of active infection at this time. Overall I'm very pleased with where things stand. 05/18/2020 upon evaluation today  patient's wound is showing signs of improvement is very dry and I think the collagen for the most part just dried to the wound bed. I am going to clean this away with sharp debridement in order to get this under control in my opinion. 05/25/2020 upon evaluation today patient actually appears to be doing well in regard to her leg ulcer. In fact upon inspection it appears she could potentially be completely healed but that is not guaranteed based on what we are seeing. There does not appear to be any signs of active infection which is great news. 05/31/2020 on evaluation today patient appears to be doing well with regard to her wounds. She in fact appears to be almost completely healed on the left leg there is just a very small opening remaining point 1 06/08/2020 upon evaluation today patient appears to be doing excellent in regard to her leg ulcer on the left in fact it appears to be that she is completely healed as of today. I see no signs of active infection at this time which is great news. No fevers, chills, nausea, vomiting, or diarrhea. READMISSION 01/16/2021 Patient that we have had in this clinic with a wound on the left posterior calf in the summer 2021 extending into October. She was felt to have chronic venous insufficiency. Per the patient's description she was discharged with what sounds like external compression stockings although she could not afford the "$75 per leg". She therefore did not get anything. Apparently her wound that she has currently is been in existence since January. She has been followed at vein and vascular with Unna boots and I think calcium alginate. She had ABIs done on 06/25/2020 that showed an noncompressible ABI on the right leg and the left leg but triphasic waveforms with good great toe pressures and a TBI of 0.90 on the right and 0.91 on the left Surprisingly the patient also had venous reflux studies that were really unremarkable. This included no evidence of  DVTs or SVTs but there was no evidence of deep venous insufficiency or superficial  venous insufficiency in the greater saphenous or short saphenous veins bilaterally. I would wonder about more central venous issues or perhaps this is all lymphedema 01/25/2021 upon evaluation today patient appears to be doing decently well and guard to her wound. The good news is the Iodoflex that is job she saw Dr. Dellia Nims last week for readmission and to be honest I think that this has done extremely well over that week. I do believe that she can tolerate the sharp debridement today which will be good. I think that cleaning off the wound will likely allow [place] to be able to use a different type of dressing I do not think the Iodoflex will even be necessary based on how clean the wound looks. Fortunately there is no sign of active infection at this time which is great news. No fevers, chills, nausea, vomiting, or diarrhea. 02/12/2021 upon evaluation today patient appears to be doing well with regard to her leg ulcer. We did switch to alginate when she came for nurse visit I think is doing much better for her. Fortunately there does not appear to be any signs of active infection at this time which is great news. No fevers, chills, nausea, vomiting, or diarrhea. ALEATHEA, PUGMIRE ([id number]) 02/18/2021 upon evaluation today patient's wound is actually showing signs of good improvement. I am very pleased with where things stand currently. I do not see any signs of active infection which is great news and overall I feel like she is making good progress. The patient likewise is happy that things are doing so well and that this is measuring somewhat smaller. 02/25/2021 upon evaluation today patient actually appears to be doing quite well in regard to her wounds. She has been tolerating the dressing changes without complication. Fortunately there does not appear to be any signs of active infection which is great news and overall I  am extremely pleased with where things stand. No fevers, chills, nausea, vomiting, or diarrhea. She does have a lot of edema I really do think she needs compression socks to be worn in order to prevent things from causing additional issues for her currently. Especially on the right leg she should be wearing compression socks all the time to be honest. 03/04/2021 upon evaluation today patient's leg ulcer actually appears to be doing pretty well which is great news. There does not appear to be any significant change but overall the appearance is better even though the size is not necessarily reflecting a great improvement. Fortunately there does not appear to be any signs of active infection. No fevers, chills, nausea, vomiting, or diarrhea. 03/12/2021 upon evaluation today patient appears to be doing well with regard to her wounds. She has been tolerating the dressing changes without complication. The good news is that the wound on her left lateral leg is doing much better and is showing signs of good improvement. Overall her swelling is controlled well as well. She does need some compression socks she plans to go Jefferey socks next week. 03/18/2021 on evaluation today patient's wound actually is showing signs of good improvement. She does have a little bit of slough this can require some sharp debridement today. 03/25/2021 upon evaluation today patient appears to be doing well with regard to her wound on the left lateral leg. She has been tolerating the dressing changes without complication. Fortunately there does not appear to be any signs of active infection at this time. No fever chills no 04/01/2021 upon evaluation today patient's wound is showing signs of  improvement and overall very pleased with where things stand at this point. There is no evidence of active infection which is great news as well and in general I think that she is making great progress. 04/09/2021 upon evaluation today patient appears  to be doing well with regard to her ankle ulcer. She is making good progress currently which is great news. There does not appear to be any signs of infection also excellent news. In general I am extremely pleased with where things stand today. No fevers, chills, nausea, vomiting, or diarrhea. 04/23/2021 upon inspection today patient appears to be doing quite well with regard to her leg ulcer. She has been tolerating the dressing changes without complication. She is can require some sharp debridement today but overall seems to be doing excellent. 05/06/2021 upon evaluation today patient unfortunately appears to be doing poorly overall in regard to her swelling. She is actually significantly more swollen than last week despite the fact that her wound is doing better this is not a good trend. I think that she probably needs to see her cardiologist ASAP and I discussed that with her today. This includes the fact that honestly I think she probably is getting need an increase in her diuretic or something to try to clear away some of the excess fluid that she is experiencing at the moment. She is not been doing her pumps even twice a day but again right now more concerned that if she were to do the pump she would fluid overload her lungs causing other issues as far as her breathing is concerned. Obviously I think she needs to contact them and move up the appointment for the 12th of something sooner 2 weeks is a bit too long to wait with how swollen she has. 05/14/2021 upon evaluation today patient's wound actually showing signs of being a little bit smaller compared to previous. She has been making good progress however. Fortunately there does not appear to be any evidence of active infection which is great. Overall I am extremely pleased in that regard. Nonetheless I am still concerned that she is having quite a bit of issue as far as the swelling is concerned she has been placed on fluid restriction as well  as an increase in the furosemide by her doctor. We will see how things progress. 05/21/2021 upon evaluation today patient appears to be doing well with regard to her wound in general. She has been tolerating the dressing changes without complication. With that being said she has been using silver cell for a while and while this was showing signs of improvement it is now gotten to the point where this has slowed down quite significantly. I do believe that switching to a different dressing may be beneficial for her today. 05/28/2021 upon evaluation today patient actually appears to be doing quite well in regard to her wound. In fact there is really not any need for sharp debridement today based on what I see. The Hydrofera Blue is doing great and overall I think that she is managing quite nicely with the compression wrapping. Her edema is still down quite a bit with the wrapping in place. 06/11/2021 upon evaluation today patient appears to be doing well with regard to her leg ulcer. She has been tolerating the dressing changes without complication. Fortunately there does not appear to be any signs of active infection at this time which is great news. No fevers, chills, nausea, vomiting, or diarrhea. She is going require some sharp debridement  today. 10/11; left leg ulcer much better looking and with improved surface area. She is using Hydrofera Blue under 3 layer compression. She does not have any stocking on the right leg which in itself has very significant nonpitting edema 06/25/2021 upon evaluation today patient appears to be doing well with regard to the wound on her left leg. She has been tolerating the dressing changes without complication and overall I am extremely pleased with where things stand today. I do not see any evidence of infection which is great news. 07/02/2021 upon evaluation today patient's wound actually showing signs of excellent improvement and actually very pleased with where  we stand today and the overall appearance. Fortunately there does not appear to be any signs of active infection. No fevers, chills, nausea, vomiting, or diarrhea. 07/09/2021 upon evaluation today patient appears to be doing better in regard to her wound this is measuring smaller and she is headed in the appropriate direction based on what I am seeing currently. I do not see any evidence of active infection systemically which is great news. 07/16/2021 upon evaluation today patient appears to be doing pretty well currently in regard to her leg ulcer. This is measuring smaller and looking much better this is great news. Fortunately there does not appear to be any evidence of active infection systemically which is great news as well. 07/23/2021 upon evaluation today patient's wound is actually showing signs of good improvement this is definitely measuring smaller. I do not see any signs of infection which is great news and overall very pleased in that regard. Overall I think that she is doing well with the Kaleesi [place], Anderson ([id number]) [place]. 07/30/2020 upon evaluation today patient appears to be doing well in regard to her wound. She has been tolerating the dressing changes without complication think the Hydrofera Blue at this point is actually causing her to dry out a lot as far as the wound bed is concerned. Subsequently I think that we need to try to see what we can do to improve this. Fortunately there does not appear to be any evidence of active infection at this time which is great news. 08/06/2021 upon evaluation today patient appears to be doing well with regards to her wound. Is not measuring significantly smaller initial inspection but upon closer inspection she has a lot of new skin growth across the central portion of the wound. I think will get very close to complete resolution. 08/13/2021 upon evaluation today patient appears to be doing well with regard to her wound. In fact  this appears to be I believe healed although there is still some question as to whether it is completely so. I am concerned about the fact that to be honest she still has some evidence of dry skin that could be just trapping something underneath I still want to debride anything as I am afraid of causing some damage to the good skin for that reason I Georgina Peer probably monitor for 1 more week before completely closing everything out. 08/20/2021 upon evaluation today patient actually appears to be doing excellent. In fact she appears to be completely healed. This was the case last week as well we just wanted to make sure nothing reopened since she does not really have an ability to put on compression socks this is good to be something we need to make sure was well-healed. Nonetheless I think we have achieved that goal as of today. 12/27; this is a patient we discharged 2 weeks ago with wounds  on her left lower leg laterally. She was supposed to get stockings and really did not get them. She developed blistering and reopening probably sometime late last week. She has 3 areas in the same area as previously. The mid calf dimensions on the left went from 39 to 51 cm today. She also has very significant edema on the right leg but as far as I am aware has not had wounds in this area 1/4; patient presents for follow-up. She has tolerated the compression wrap well. She has no issues or complaints today. 09/17/2021 upon evaluation today patient appears to be doing well with regard to her wounds. She has been tolerating the dressing changes without complication. Fortunately there does not appear to be any signs of active infection at this time. No fevers, chills, nausea, vomiting, or diarrhea. 10/01/2021 upon evaluation today patient appears to be doing well with regard to her wound this is actually measuring better and looking smaller and I am very pleased in that regard. Fortunately I do not see any evidence of  active infection locally nor systemically at this point. No fevers, chills, nausea, vomiting, or diarrhea. 10/08/2021 upon evaluation patient's wounds are actually showing some signs of improvement measuring a little bit smaller and looking a little bit better. Overall I do not think there is any need for sharp debridement today which is good news. No fevers, chills, nausea, vomiting, or diarrhea. 10/14/2021 upon evaluation today patient appears to be doing well with regard to her wound. She has been tolerating the dressing changes without complication and overall I am extremely pleased with where things stand today. There is a little bit of film on the surface of the wounds but this was carefully cleaned away with just saline and gauze she really did not want me to do any sharp debridement today. 10/29/2021 upon evaluation today patient appears to be doing quite well in regard to her wounds. She is actually showing signs of excellent improvement which is great news and overall I feel like we are on the right track at this time. She does have a wound on both legs and she does need something compression wise when she heals for that reason we will get a go and see about ordering her bilateral juxta lite compression wraps. 11/05/2021 upon evaluation today patient appears to be doing well with regard to her wound. She has been tolerating the dressing changes without complication. Fortunately I do not see any evidence of active infection locally nor systemically at this time which is great news. No fevers, chills, nausea, vomiting, or diarrhea. 11/12/2021 upon evaluation today patient appears to be doing well with regard to her wounds. She is doing excellent on the left on the right this is not doing quite as well there is some slough and biofilm buildup. Electronic Signature(s) Signed: 11/12/2021 5:04:50 PM By: Worthy Keeler PA-C Entered By: Worthy Keeler on 11/12/2021 17:04:50 [name]  ([id number]) -------------------------------------------------------------------------------- Physical Exam Details Patient Name: [name] Date of Service: 11/12/2021 2:45 PM Medical Record Number: [id number] Patient Account Number: [id number] Date of Birth/Sex: 1945/11/26 (76 y.o. F) Treating RN: Carlene Coria Primary Care Provider: [name] Other Clinician: Referring Provider: [name] Treating Provider/Extender: Skipper Cliche in Treatment: 65 Constitutional Well-nourished and well-hydrated in no acute distress. Respiratory normal breathing without difficulty. Psychiatric this patient is able to make decisions and demonstrates good insight into disease process. Alert and Oriented x 3. pleasant and cooperative. Notes Upon inspection patient's wounds are showing  signs of doing decently well. Specially on the left. On the right she does have some slough and biofilm that I would actually perform debridement in regard to today. She is in agreement with that plan. Electronic Signature(s) Signed: 11/12/2021 5:05:20 PM By: Worthy Keeler PA-C Entered By: Worthy Keeler on 11/12/2021 17:05:20 [name] ([id number]) -------------------------------------------------------------------------------- Physician Orders Details Patient Name: [name] Date of Service: 11/12/2021 2:45 PM Medical Record Number: [id number] Patient Account Number: [id number] Date of Birth/Sex: 12-Oct-1945 (76 y.o. F) Treating RN: Carlene Coria Primary Care Provider: [name] Other Clinician: Referring Provider: [name] Treating Provider/Extender: Skipper Cliche in Treatment: 32 Verbal / Phone Orders: No Diagnosis Coding ICD-10 Coding Code Description I89.0 Lymphedema, not elsewhere classified I87.332 Chronic venous hypertension (idiopathic) with ulcer and inflammation of left lower extremity L97.828 Non-pressure chronic ulcer of other part of left lower leg with other  specified severity Follow-up Appointments o Return Appointment in 1 week. o Nurse Visit as needed Bathing/ Shower/ Hygiene o May shower with wound dressing protected with water repellent cover or cast protector. o No tub bath. Anesthetic (Use 'Patient Medications' Section for Anesthetic Order Entry) o Lidocaine applied to wound bed Edema Control - Lymphedema / Segmental Compressive Device / Other Left Lower Extremity o Optional: One layer of unna paste to top of compression wrap (to act as an anchor). o Elevate, Exercise Daily and Avoid Standing for Long Periods of Time. o Elevate legs to the level of the heart and pump ankles as often as possible o Elevate leg(s) parallel to the floor when sitting. o Compression Pump: Use compression pump on left lower extremity for 60 minutes, twice daily. - You may pump over wraps o Compression Pump: Use compression pump on right lower extremity for 60 minutes, twice daily. - You may pump over wraps Additional Orders / Instructions o Follow Nutritious Diet and Increase Protein Intake Wound Treatment Wound #3 - Lower Leg Wound Laterality: Left Cleanser: Soap and Water 1 x Per Week/30 Days Discharge Instructions: Gently cleanse wound with antibacterial soap, rinse and pat dry prior to dressing wounds Cleanser: Wound Cleanser 1 x Per Week/30 Days Discharge Instructions: Wash your hands with soap and water. Remove old dressing, discard into plastic bag and place into trash. Cleanse the wound with Wound Cleanser prior to applying a clean dressing using gauze sponges, not tissues or cotton balls. Do not scrub or use excessive force. Pat dry using gauze sponges, not tissue or cotton balls. Primary Dressing: Hydrofera Blue Ready Transfer Foam, 4x5 (in/in) 1 x Per Week/30 Days Discharge Instructions: Apply Hydrofera Blue Ready to wound bed as directed Secondary Dressing: ABD Pad 5x9 (in/in) 1 x Per Week/30 Days Discharge Instructions:  Cover with ABD pad Compression Wrap: 3-LAYER WRAP - Profore Lite LF 3 Multilayer Compression Bandaging System 1 x Per Week/30 Days Discharge Instructions: Apply 3 multi-layer wrap as prescribed. Compression Stockings: Circaid Juxta Lite Compression Wrap Left Leg Compression Amount: 30-40 mmHG Discharge Instructions: Apply Circaid Juxta Lite Compression Wrap as directed Wound #4 - Lower Leg Wound Laterality: Right, Lateral Cleanser: Soap and Water 1 x Per Week/30 Days KHRISTI, [name] ([id number]) Discharge Instructions: Gently cleanse wound with antibacterial soap, rinse and pat dry prior to dressing wounds Cleanser: Wound Cleanser 1 x Per Week/30 Days Discharge Instructions: Wash your hands with soap and water. Remove old dressing, discard into plastic bag and place into trash. Cleanse the wound with Wound Cleanser prior to applying a clean dressing using gauze sponges, not tissues or  cotton balls. Do not scrub or use excessive force. Pat dry using gauze sponges, not tissue or cotton balls. Primary Dressing: Hydrofera Blue Ready Transfer Foam, 4x5 (in/in) 1 x Per Week/30 Days Discharge Instructions: Apply Hydrofera Blue Ready to wound bed as directed Secondary Dressing: ABD Pad 5x9 (in/in) 1 x Per Week/30 Days Discharge Instructions: Cover with ABD pad Compression Wrap: 3-LAYER WRAP - Profore Lite LF 3 Multilayer Compression Bandaging System 1 x Per Week/30 Days Discharge Instructions: Apply 3 multi-layer wrap as prescribed. Compression Stockings: Circaid Juxta Lite Compression Wrap Right Leg Compression Amount: 20-30 mmHG Discharge Instructions: Apply Circaid Juxta Lite Compression Wrap as directed Electronic Signature(s) Signed: 11/12/2021 4:20:37 PM By: Carlene Coria RN Signed: 11/12/2021 7:37:05 PM By: Worthy Keeler PA-C Entered By: Carlene Coria on 11/12/2021 15:22:14 [name] ([id number]) -------------------------------------------------------------------------------- Problem  List Details Patient Name: [name] Date of Service: 11/12/2021 2:45 PM Medical Record Number: [id number] Patient Account Number: [id number] Date of Birth/Sex: 1946/02/01 (76 y.o. 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 of 01/16/2021 No Yes left lower extremity L97.828 Non-pressure chronic ulcer of other part of left lower leg with other 01/16/2021 No Yes specified severity Inactive Problems Resolved Problems Electronic Signature(s) Signed: 11/12/2021 2:32:32 PM By: Worthy Keeler PA-C Entered By: Worthy Keeler on 11/12/2021 14:32:32 [name] ([id number]) -------------------------------------------------------------------------------- Progress Note Details Patient Name: [name] Date of Service: 11/12/2021 2:45 PM Medical Record Number: [id number] Patient Account Number: [id number] Date of Birth/Sex: 1946/05/08 (76 y.o. F) Treating RN: Carlene Coria Primary Care Provider: [name] Other Clinician: Referring Provider: [name] Treating Provider/Extender: Skipper Cliche in Treatment: 42 Subjective Chief Complaint Information obtained from Patient Left LE Ulcer History of Present Illness (HPI) 03/15/2020 upon evaluation today patient presents today for initial evaluation here in our clinic concerning a wound that is on the left posterior lower extremity. Unfortunately this has been giving the patient some discomfort at this point which she notes has been affected her pretty much daily. The patient does have a history of venous insufficiency, hypertension, congestive heart failure, and a history of breast  cancer. Fortunately there does not appear to be evidence of active infection at this time which is great news. No fevers, chills, nausea, vomiting, or diarrhea. Wound is not extremely large but does have some slough covering the surface of the wound. This is going require sharp debridement today. 03/15/2020 upon evaluation today patient appears to be doing fairly well in regard to her wound. She does have some slough noted on the surface of the wound currently but I do believe the Iodoflex has been beneficial over the past week. There is no signs of active infection at this time which is great news and overall very pleased with the progress. No fevers, chills, nausea, vomiting, or diarrhea. 04/02/2020 upon evaluation today patient appears to be doing a little better in regard to her wound size wise this is not tremendously smaller she does have some slough buildup on the surface of the wound. With that being said this is going require some sharp debridement today to clear away some of the necrotic debris. Fortunately there is no evidence of active infection at this time. No fevers, chills, nausea, vomiting, or diarrhea. 04/10/2020 on evaluation today  patient appears to be doing well with regard to her ulcer which is measuring somewhat smaller today. She actually has more granulation tissue noted which is also good news is no need for sharp debridement today. Fortunately there is no evidence of infection either which is also excellent. No fevers, chills, nausea, vomiting, or diarrhea. 04/26/2020 on evaluation today patient's wound actually is showing signs of good improvement which is great news. There does not appear to be any evidence of active infection. I do believe she is tolerating the collagen at this point. 05/03/2020 upon evaluation today patient's wound actually showed signs of good granulation at this time there does not appear to be any evidence of active infection which is great news and overall  very pleased with where things stand. With that being said I do believe that the patient is can require some sharp debridement today but fortunately nothing too significant. 05/11/20 on evaluation today patient appears to be doing well in regard to her leg ulcer. She's been tolerating the dressing changes without complication. Fortunately there is no signs of active infection at this time. Overall I'm very pleased with where things stand. 05/18/2020 upon evaluation today patient's wound is showing signs of improvement is very dry and I think the collagen for the most part just dried to the wound bed. I am going to clean this away with sharp debridement in order to get this under control in my opinion. 05/25/2020 upon evaluation today patient actually appears to be doing well in regard to her leg ulcer. In fact upon inspection it appears she could potentially be completely healed but that is not guaranteed based on what we are seeing. There does not appear to be any signs of active infection which is great news. 05/31/2020 on evaluation today patient appears to be doing well with regard to her wounds. She in fact appears to be almost completely healed on the left leg there is just a very small opening remaining point 1 06/08/2020 upon evaluation today patient appears to be doing excellent in regard to her leg ulcer on the left in fact it appears to be that she is completely healed as of today. I see no signs of active infection at this time which is great news. No fevers, chills, nausea, vomiting, or diarrhea. READMISSION 01/16/2021 Patient that we have had in this clinic with a wound on the left posterior calf in the summer 2021 extending into October. She was felt to have chronic venous insufficiency. Per the patient's description she was discharged with what sounds like external compression stockings although she could not afford the "$75 per leg". She therefore did not get anything. Apparently her wound  that she has currently is been in existence since January. She has been followed at vein and vascular with Unna boots and I think calcium alginate. She had ABIs done on 06/25/2020 that showed an noncompressible ABI on the right leg and the left leg but triphasic waveforms with good great toe pressures and a TBI of 0.90 on the right and 0.91 on the left Surprisingly the patient also had venous reflux studies that were really unremarkable. This included no evidence of DVTs or SVTs but there was no evidence of deep venous insufficiency or superficial venous insufficiency in the greater saphenous or short saphenous veins bilaterally. I would wonder about more central venous issues or perhaps this is all lymphedema 01/25/2021 upon evaluation today patient appears to be doing decently well and guard to her wound. The good  news is the Iodoflex that is job she saw Dr. Dellia Nims last week for readmission and to be honest I think that this has done extremely well over that week. I do believe that she can tolerate the sharp debridement today which will be good. I think that cleaning off the wound will likely allow [place] to be able to use a different type of dressing I do not think the Iodoflex will even be necessary based on how clean the wound looks. Fortunately there is no sign of active infection at this time which is great news. No fevers, chills, nausea, vomiting, or diarrhea. [name], [name] ([id number]) 02/12/2021 upon evaluation today patient appears to be doing well with regard to her leg ulcer. We did switch to alginate when she came for nurse visit I think is doing much better for her. Fortunately there does not appear to be any signs of active infection at this time which is great news. No fevers, chills, nausea, vomiting, or diarrhea. 02/18/2021 upon evaluation today patient's wound is actually showing signs of good improvement. I am very pleased with where things stand currently. I do not see any signs of  active infection which is great news and overall I feel like she is making good progress. The patient likewise is happy that things are doing so well and that this is measuring somewhat smaller. 02/25/2021 upon evaluation today patient actually appears to be doing quite well in regard to her wounds. She has been tolerating the dressing changes without complication. Fortunately there does not appear to be any signs of active infection which is great news and overall I am extremely pleased with where things stand. No fevers, chills, nausea, vomiting, or diarrhea. She does have a lot of edema I really do think she needs compression socks to be worn in order to prevent things from causing additional issues for her currently. Especially on the right leg she should be wearing compression socks all the time to be honest. 03/04/2021 upon evaluation today patient's leg ulcer actually appears to be doing pretty well which is great news. There does not appear to be any significant change but overall the appearance is better even though the size is not necessarily reflecting a great improvement. Fortunately there does not appear to be any signs of active infection. No fevers, chills, nausea, vomiting, or diarrhea. 03/12/2021 upon evaluation today patient appears to be doing well with regard to her wounds. She has been tolerating the dressing changes without complication. The good news is that the wound on her left lateral leg is doing much better and is showing signs of good improvement. Overall her swelling is controlled well as well. She does need some compression socks she plans to go Jefferey socks next week. 03/18/2021 on evaluation today patient's wound actually is showing signs of good improvement. She does have a little bit of slough this can require some sharp debridement today. 03/25/2021 upon evaluation today patient appears to be doing well with regard to her wound on the left lateral leg. She has been  tolerating the dressing changes without complication. Fortunately there does not appear to be any signs of active infection at this time. No fever chills no 04/01/2021 upon evaluation today patient's wound is showing signs of improvement and overall very pleased with where things stand at this point. There is no evidence of active infection which is great news as well and in general I think that she is making great progress. 04/09/2021 upon evaluation today patient appears  to be doing well with regard to her ankle ulcer. She is making good progress currently which is great news. There does not appear to be any signs of infection also excellent news. In general I am extremely pleased with where things stand today. No fevers, chills, nausea, vomiting, or diarrhea. 04/23/2021 upon inspection today patient appears to be doing quite well with regard to her leg ulcer. She has been tolerating the dressing changes without complication. She is can require some sharp debridement today but overall seems to be doing excellent. 05/06/2021 upon evaluation today patient unfortunately appears to be doing poorly overall in regard to her swelling. She is actually significantly more swollen than last week despite the fact that her wound is doing better this is not a good trend. I think that she probably needs to see her cardiologist ASAP and I discussed that with her today. This includes the fact that honestly I think she probably is getting need an increase in her diuretic or something to try to clear away some of the excess fluid that she is experiencing at the moment. She is not been doing her pumps even twice a day but again right now more concerned that if she were to do the pump she would fluid overload her lungs causing other issues as far as her breathing is concerned. Obviously I think she needs to contact them and move up the appointment for the 12th of something sooner 2 weeks is a bit too long to wait with how  swollen she has. 05/14/2021 upon evaluation today patient's wound actually showing signs of being a little bit smaller compared to previous. She has been making good progress however. Fortunately there does not appear to be any evidence of active infection which is great. Overall I am extremely pleased in that regard. Nonetheless I am still concerned that she is having quite a bit of issue as far as the swelling is concerned she has been placed on fluid restriction as well as an increase in the furosemide by her doctor. We will see how things progress. 05/21/2021 upon evaluation today patient appears to be doing well with regard to her wound in general. She has been tolerating the dressing changes without complication. With that being said she has been using silver cell for a while and while this was showing signs of improvement it is now gotten to the point where this has slowed down quite significantly. I do believe that switching to a different dressing may be beneficial for her today. 05/28/2021 upon evaluation today patient actually appears to be doing quite well in regard to her wound. In fact there is really not any need for sharp debridement today based on what I see. The Hydrofera Blue is doing great and overall I think that she is managing quite nicely with the compression wrapping. Her edema is still down quite a bit with the wrapping in place. 06/11/2021 upon evaluation today patient appears to be doing well with regard to her leg ulcer. She has been tolerating the dressing changes without complication. Fortunately there does not appear to be any signs of active infection at this time which is great news. No fevers, chills, nausea, vomiting, or diarrhea. She is going require some sharp debridement today. 10/11; left leg ulcer much better looking and with improved surface area. She is using Hydrofera Blue under 3 layer compression. She does not have any stocking on the right leg which in itself  has very significant nonpitting edema 06/25/2021 upon  evaluation today patient appears to be doing well with regard to the wound on her left leg. She has been tolerating the dressing changes without complication and overall I am extremely pleased with where things stand today. I do not see any evidence of infection which is great news. 07/02/2021 upon evaluation today patient's wound actually showing signs of excellent improvement and actually very pleased with where we stand today and the overall appearance. Fortunately there does not appear to be any signs of active infection. No fevers, chills, nausea, vomiting, or diarrhea. 07/09/2021 upon evaluation today patient appears to be doing better in regard to her wound this is measuring smaller and she is headed in the appropriate direction based on what I am seeing currently. I do not see any evidence of active infection systemically which is great news. 07/16/2021 upon evaluation today patient appears to be doing pretty well currently in regard to her leg ulcer. This is measuring smaller and looking much better this is great news. Fortunately there does not appear to be any evidence of active infection systemically which is great news [name], [name] ([id number]) as well. 07/23/2021 upon evaluation today patient's wound is actually showing signs of good improvement this is definitely measuring smaller. I do not see any signs of infection which is great news and overall very pleased in that regard. Overall I think that she is doing well with the [facility]. 07/30/2020 upon evaluation today patient appears to be doing well in regard to her wound. She has been tolerating the dressing changes without complication think the Hydrofera Blue at this point is actually causing her to dry out a lot as far as the wound bed is concerned. Subsequently I think that we need to try to see what we can do to improve this. Fortunately there does not appear to be  any evidence of active infection at this time which is great news. 08/06/2021 upon evaluation today patient appears to be doing well with regards to her wound. Is not measuring significantly smaller initial inspection but upon closer inspection she has a lot of new skin growth across the central portion of the wound. I think will get very close to complete resolution. 08/13/2021 upon evaluation today patient appears to be doing well with regard to her wound. In fact this appears to be I believe healed although there is still some question as to whether it is completely so. I am concerned about the fact that to be honest she still has some evidence of dry skin that could be just trapping something underneath I still want to debride anything as I am afraid of causing some damage to the good skin for that reason I Georgina Peer probably monitor for 1 more week before completely closing everything out. 08/20/2021 upon evaluation today patient actually appears to be doing excellent. In fact she appears to be completely healed. This was the case last week as well we just wanted to make sure nothing reopened since she does not really have an ability to put on compression socks this is good to be something we need to make sure was well-healed. Nonetheless I think we have achieved that goal as of today. 12/27; this is a patient we discharged 2 weeks ago with wounds on her left lower leg laterally. She was supposed to get stockings and really did not get them. She developed blistering and reopening probably sometime late last week. She has 3 areas in the same area as previously. The mid calf dimensions on  the left went from 39 to 51 cm today. She also has very significant edema on the right leg but as far as I am aware has not had wounds in this area 1/4; patient presents for follow-up. She has tolerated the compression wrap well. She has no issues or complaints today. 09/17/2021 upon evaluation today patient appears  to be doing well with regard to her wounds. She has been tolerating the dressing changes without complication. Fortunately there does not appear to be any signs of active infection at this time. No fevers, chills, nausea, vomiting, or diarrhea. 10/01/2021 upon evaluation today patient appears to be doing well with regard to her wound this is actually measuring better and looking smaller and I am very pleased in that regard. Fortunately I do not see any evidence of active infection locally nor systemically at this point. No fevers, chills, nausea, vomiting, or diarrhea. 10/08/2021 upon evaluation patient's wounds are actually showing some signs of improvement measuring a little bit smaller and looking a little bit better. Overall I do not think there is any need for sharp debridement today which is good news. No fevers, chills, nausea, vomiting, or diarrhea. 10/14/2021 upon evaluation today patient appears to be doing well with regard to her wound. She has been tolerating the dressing changes without complication and overall I am extremely pleased with where things stand today. There is a little bit of film on the surface of the wounds but this was carefully cleaned away with just saline and gauze she really did not want me to do any sharp debridement today. 10/29/2021 upon evaluation today patient appears to be doing quite well in regard to her wounds. She is actually showing signs of excellent improvement which is great news and overall I feel like we are on the right track at this time. She does have a wound on both legs and she does need something compression wise when she heals for that reason we will get a go and see about ordering her bilateral juxta lite compression wraps. 11/05/2021 upon evaluation today patient appears to be doing well with regard to her wound. She has been tolerating the dressing changes without complication. Fortunately I do not see any evidence of active infection locally nor  systemically at this time which is great news. No fevers, chills, nausea, vomiting, or diarrhea. 11/12/2021 upon evaluation today patient appears to be doing well with regard to her wounds. She is doing excellent on the left on the right this is not doing quite as well there is some slough and biofilm buildup. Objective Constitutional Well-nourished and well-hydrated in no acute distress. Vitals Time Taken: 2:45 PM, Height: 69 in, Weight: 244 lbs, BMI: 36, Temperature: 98.2 F, Pulse: 66 bpm, Respiratory Rate: 18 breaths/min, Blood Pressure: 190/88 mmHg. Respiratory normal breathing without difficulty. LAKIN, RHINE ([id number]) Psychiatric this patient is able to make decisions and demonstrates good insight into disease process. Alert and Oriented x 3. pleasant and cooperative. General Notes: Upon inspection patient's wounds are showing signs of doing decently well. Specially on the left. On the right she does have some slough and biofilm that I would actually perform debridement in regard to today. She is in agreement with that plan. Integumentary (Hair, Skin) Wound #3 status is Open. Original cause of wound was Gradually Appeared. The date acquired was: 08/15/2021. The wound has been in treatment 10 weeks. The wound is located on the Left Lower Leg. The wound measures 2.3cm length x 2cm width x 0.1cm depth; 3.613cm^2  area and 0.361cm^3 volume. There is Fat Layer (Subcutaneous Tissue) exposed. There is no tunneling or undermining noted. There is a medium amount of serosanguineous drainage noted. There is large (67-100%) red, hyper - granulation within the wound bed. There is a small (1-33%) amount of necrotic tissue within the wound bed including Adherent Slough. Wound #4 status is Open. Original cause of wound was Gradually Appeared. The date acquired was: 10/27/2021. The wound has been in treatment 2 weeks. The wound is located on the Right,Lateral Lower Leg. The wound measures 3cm length  x 2.5cm width x 0.1cm depth; 5.89cm^2 area and 0.589cm^3 volume. There is Fat Layer (Subcutaneous Tissue) exposed. There is no tunneling or undermining noted. There is a medium amount of serosanguineous drainage noted. There is medium (34-66%) red granulation within the wound bed. There is a medium (34-66%) amount of necrotic tissue within the wound bed including Adherent Slough. 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 There was a Excisional Skin/Subcutaneous Tissue Debridement with a total area of 7.5 sq cm performed by Tommie Sams., PA-C. With the following instrument(s): Curette to remove Viable and Non-Viable tissue/material. Material removed includes Subcutaneous Tissue, Slough, and Biofilm after achieving pain control using Lidocaine 4% Topical Solution. No specimens were taken. A time out was conducted at 14:20, prior to the start of the procedure. A Moderate amount of bleeding was controlled with Pressure. The procedure was tolerated well with a pain level of 0 throughout and a pain level of 0 following the procedure. Post Debridement Measurements: 3cm length x 2.5cm width x 0.1cm depth; 0.589cm^3 volume. Character of Wound/Ulcer Post Debridement is improved. Severity of Tissue Post Debridement is: Fat layer exposed. Post procedure Diagnosis Wound #4: Same as Pre-Procedure Plan Follow-up Appointments: Return Appointment in 1 week. Nurse Visit as needed Bathing/ Shower/ Hygiene: May shower with wound dressing protected with water repellent cover or cast protector. No tub bath. Anesthetic (Use  'Patient Medications' Section for Anesthetic Order Entry): Lidocaine applied to wound bed Edema Control - Lymphedema / Segmental Compressive Device / Other: Optional: One layer of unna paste to top of compression wrap (to act as an anchor). Elevate, Exercise Daily and Avoid Standing for Long Periods of Time. Elevate legs to the level of the heart and pump ankles as often as possible Elevate leg(s) parallel to the floor when sitting. Compression Pump: Use compression pump on left lower extremity for 60 minutes, twice daily. - You may pump over wraps Compression Pump: Use compression pump on right lower extremity for 60 minutes, twice daily. - You may pump over wraps Additional Orders / Instructions: Follow Nutritious Diet and Increase Protein Intake [name], [name] ([id number]) WOUND #3: - Lower Leg Wound Laterality: Left Cleanser: Soap and Water 1 x Per Week/30 Days Discharge Instructions: Gently cleanse wound with antibacterial soap, rinse and pat dry prior to dressing wounds Cleanser: Wound Cleanser 1 x Per Week/30 Days Discharge Instructions: Wash your hands with soap and water. Remove old dressing, discard into plastic bag and place into trash. Cleanse the wound with Wound Cleanser prior to applying a clean dressing using gauze sponges, not tissues or cotton balls. Do not scrub or use excessive force. [place]  dry using gauze sponges, not tissue or cotton balls. Primary Dressing: Hydrofera Blue Ready Transfer Foam, 4x5 (in/in) 1 x Per Week/30 Days Discharge Instructions: Apply Hydrofera Blue Ready to wound bed as directed Secondary Dressing: ABD Pad 5x9 (in/in) 1 x Per Week/30 Days Discharge Instructions: Cover with ABD pad Compression Wrap: 3-LAYER WRAP - Profore Lite LF 3 Multilayer Compression Bandaging System 1 x Per Week/30 Days Discharge Instructions: Apply 3 multi-layer wrap as prescribed. Compression Stockings: Circaid Juxta Lite Compression Wrap Compression Amount: 30-40 mmHg  (left) Discharge Instructions: Apply Circaid Juxta Lite Compression Wrap as directed WOUND #4: - Lower Leg Wound Laterality: Right, Lateral Cleanser: Soap and Water 1 x Per Week/30 Days Discharge Instructions: Gently cleanse wound with antibacterial soap, rinse and pat dry prior to dressing wounds Cleanser: Wound Cleanser 1 x Per Week/30 Days Discharge Instructions: Wash your hands with soap and water. Remove old dressing, discard into plastic bag and place into trash. Cleanse the wound with Wound Cleanser prior to applying a clean dressing using gauze sponges, not tissues or cotton balls. Do not scrub or use excessive force. Pat dry using gauze sponges, not tissue or cotton balls. Primary Dressing: Hydrofera Blue Ready Transfer Foam, 4x5 (in/in) 1 x Per Week/30 Days Discharge Instructions: Apply Hydrofera Blue Ready to wound bed as directed Secondary Dressing: ABD Pad 5x9 (in/in) 1 x Per Week/30 Days Discharge Instructions: Cover with ABD pad Compression Wrap: 3-LAYER WRAP - Profore Lite LF 3 Multilayer Compression Bandaging System 1 x Per Week/30 Days Discharge Instructions: Apply 3 multi-layer wrap as prescribed. Compression Stockings: Circaid Juxta Lite Compression Wrap Compression Amount: 20-30 mmHg (right) Discharge Instructions: Apply Circaid Juxta Lite Compression Wrap as directed 1. I am good recommend currently that we going to continue with the wound care measures as before and the patient is in agreement with plan. This includes the use of the 3 layer compression wrap which I think is doing quite well. 2. I am also can recommend that we continue to monitor for any signs of worsening infection if anything changes she should let us know such as increased pain the right now we will keep a close eye on things as well. We will see patient back for reevaluation in 1 week here in the clinic. If anything worsens or changes patient will contact our office for  additional recommendations. Electronic Signature(s) Signed: 11/12/2021 5:05:52 PM By: Worthy Keeler PA-C Entered By: Worthy Keeler on 11/12/2021 17:05:52 [name] ([id number]) -------------------------------------------------------------------------------- SuperBill Details Patient Name: [name] Date of Service: 11/12/2021 Medical Record Number: [id number] Patient Account Number: [id number] Date of Birth/Sex: 05-25-1946 (76 y.o. F) Treating RN: Carlene Coria Primary Care Provider: [name] Other Clinician: Referring Provider: [name] Treating Provider/Extender: Skipper Cliche in Treatment: 42 Diagnosis Coding ICD-10 Codes Code Description I89.0 Lymphedema, not elsewhere classified I87.332 Chronic venous hypertension (idiopathic) with ulcer and inflammation of left lower extremity L97.828 Non-pressure chronic ulcer of other part of left lower leg with other specified severity Facility Procedures CPT4 Code: [id number] Description: 11042 - DEB SUBQ TISSUE 20 SQ CM/< Modifier: Quantity: 1 CPT4 Code: Description: ICD-10 Diagnosis Description L97.828 Non-pressure chronic ulcer of other part of left lower leg with other spec Modifier: ified severity Quantity: Physician Procedures CPT4 Code Description: 9024097 99214 - WC PHYS LEVEL 4 - EST PT Modifier: 25 Quantity: 1 CPT4 Code Description: ICD-10 Diagnosis Description I89.0 Lymphedema, not elsewhere classified I87.332 Chronic venous hypertension (idiopathic) with ulcer and inflammation of lef L97.828 Non-pressure chronic ulcer of other  part of left lower leg with  other speci Modifier: t lower extremity fied severity Quantity: CPT4 Code Description: 9357017 79390 - WC PHYS SUBQ TISS 20 SQ CM Modifier: Quantity: 1 CPT4 Code Description: ICD-10 Diagnosis Description L97.828 Non-pressure chronic ulcer of other part of left lower leg with other speci Modifier: fied severity Quantity: Electronic  Signature(s) Signed: 11/12/2021 5:06:23 PM By: Worthy Keeler PA-C Previous Signature: 11/12/2021 3:47:00 PM Version By: Carlene Coria RN Entered By: Worthy Keeler on 11/12/2021 17:06:22

## 2021-11-13 ENCOUNTER — Encounter: Payer: Self-pay | Admitting: Radiation Oncology

## 2021-11-13 ENCOUNTER — Ambulatory Visit
Admission: RE | Admit: 2021-11-13 | Discharge: 2021-11-13 | Disposition: A | Discharge status: Home / Self Care | Payer: Medicare Other | Source: Ambulatory Visit | Attending: Radiation Oncology | Admitting: Radiation Oncology

## 2021-11-13 ENCOUNTER — Inpatient Hospital Stay: Payer: Medicare Other | Attending: Hospice and Palliative Medicine

## 2021-11-13 VITALS — BP 148/69 | HR 60 | Temp 96.9°F | Resp 16

## 2021-11-13 DIAGNOSIS — Z79811 Long term (current) use of aromatase inhibitors: Secondary | ICD-10-CM | POA: Diagnosis not present

## 2021-11-13 DIAGNOSIS — Z170 Estrogen receptor positive status [ER+]: Secondary | ICD-10-CM | POA: Diagnosis not present

## 2021-11-13 DIAGNOSIS — Z923 Personal history of irradiation: Secondary | ICD-10-CM | POA: Insufficient documentation

## 2021-11-13 DIAGNOSIS — C50411 Malignant neoplasm of upper-outer quadrant of right female breast: Secondary | ICD-10-CM

## 2021-11-13 DIAGNOSIS — R609 Edema, unspecified: Secondary | ICD-10-CM | POA: Insufficient documentation

## 2021-11-13 NOTE — Progress Notes (Signed)
Radiation Oncology ?Follow up Note ? ?Name: [name]   ?Date:   11/13/2021 ?MRN:  [id number] ?DOB: [birth year]  ? ? ?This 75 y.o. female presents to the clinic today for 3-year follow-up status post hypofractionated radiation therapy to her right breast for stage I ER/PR positive invasive mammary carcinoma. ? ?REFERRING PROVIDER: Donnie Coffin, MD ? 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  She is having problems with lymphedema in her lower extremities and is under care of the wound center..  She had mammograms back in January which were BI-RADS 1 negative which I have reviewed.  She is currently on Arimidex tolerating it well without side effect. ? ?COMPLICATIONS OF TREATMENT: none ? ?FOLLOW UP COMPLIANCE: keeps appointments  ? ?PHYSICAL EXAM:  ?BP (!) 148/69 (BP Location: Left Arm)   Pulse 60   Temp (!) 96.9 ?F (36.1 ?C) (Tympanic)   Resp 16  ?Wheelchair-bound female with both lower extremities wrapped with significant lymphedema.  Lungs are clear to A&P cardiac examination essentially unremarkable with regular rate and rhythm. No dominant mass or nodularity is noted in either breast in 2 positions examined. Incision is well-healed. No axillary or supraclavicular adenopathy is appreciated. Cosmetic result is excellent.  Well-developed well-nourished patient in NAD. HEENT reveals PERLA, EOMI, discs not visualized.  Oral cavity is clear. No oral mucosal lesions are identified. Neck is clear without evidence of cervical or supraclavicular adenopathy. Lungs are clear to A&P. Cardiac examination is essentially unremarkable with regular rate and rhythm without murmur rub or thrill. Abdomen is benign with no organomegaly or  masses noted. Motor sensory and DTR levels are equal and symmetric in the upper and lower extremities. Cranial nerves II through XII are grossly intact. Proprioception is intact. No peripheral adenopathy or edema is identified. No motor or sensory levels are noted. Crude visual fields are within normal range. ? ?RADIOLOGY RESULTS: Mammograms reviewed compatible with above-stated findings ? ?PLAN: Present time patient is doing well with no evidence of disease of her breast cancer.  At this time she will continue follow-up care with medical oncology.  I am going to discontinue follow-up care based on the patient's mobility and her overall prognosis.  Patient knows to call with any concerns at any time. ? ?I would like to take this opportunity to thank you for allowing me to participate in the care of your patient.. ?  ? [name], MD ? ?

## 2021-11-19 ENCOUNTER — Other Ambulatory Visit: Payer: Self-pay

## 2021-11-19 ENCOUNTER — Encounter (HOSPITAL_BASED_OUTPATIENT_CLINIC_OR_DEPARTMENT_OTHER): Payer: Medicare Other | Admitting: Internal Medicine

## 2021-11-19 DIAGNOSIS — I87332 Chronic venous hypertension (idiopathic) with ulcer and inflammation of left lower extremity: Secondary | ICD-10-CM | POA: Diagnosis not present

## 2021-11-19 DIAGNOSIS — I890 Lymphedema, not elsewhere classified: Secondary | ICD-10-CM

## 2021-11-19 DIAGNOSIS — I87322 Chronic venous hypertension (idiopathic) with inflammation of left lower extremity: Secondary | ICD-10-CM | POA: Diagnosis not present

## 2021-11-19 DIAGNOSIS — L97828 Non-pressure chronic ulcer of other part of left lower leg with other specified severity: Secondary | ICD-10-CM | POA: Diagnosis not present

## 2021-11-22 NOTE — Progress Notes (Signed)
[place], [place] ([id number]) ?Visit Report for 11/19/2021 ?Arrival Information Details ?Patient Name: [name], SLOVACEK ?Date of Service: 11/19/2021 2:45 PM ?Medical Record Number: [id number] ?Patient Account Number: [id number] ?Date of Birth/Sex: 09/17/1945 (76 y.o. F) ?Treating RN: Carlene Coria ?Primary Care Zosia Lucchese: [name] Other Clinician: ?Referring Yussuf Sawyers: [name] ?Treating Sherrelle Prochazka/Extender: Kalman Shan ?Weeks in Treatment: 49 ?Visit Information History Since Last Visit ?All ordered tests and consults were completed: No ?Patient Arrived: Wheel Chair ?Added or deleted any medications: No ?Arrival Time: 14:58 ?Any new allergies or adverse reactions: No ?Accompanied By: self ?Had a fall or experienced change in No ?Transfer Assistance: None ?activities of daily living that may affect ?Patient Identification Verified: Yes ?risk of falls: ?Secondary Verification Process Completed: Yes ?Signs or symptoms of abuse/neglect since last visito No ?Patient Requires Transmission-Based Precautions: No ?Hospitalized since last visit: No ?Patient Has Alerts: Yes ?Implantable device outside of the clinic excluding No ?Patient Alerts: NOT DIABETIC ?cellular tissue based products placed in the center ?since last visit: ?Has Dressing in Place as Prescribed: Yes ?Has Compression in Place as Prescribed: Yes ?Pain Present Now: No ?Electronic Signature(s) ?Signed: 11/22/2021 4:20:50 PM By: Carlene Coria RN ?Entered By: Carlene Coria on 11/19/2021 15:05:40 ?[place], [place] ([id number]) ?-------------------------------------------------------------------------------- ?Clinic Level of Care Assessment Details ?Patient Name: [name], TOELLE ?Date of Service: 11/19/2021 2:45 PM ?Medical Record Number: [id number] ?Patient Account Number: [id number] ?Date of Birth/Sex: 1946/05/23 (75 y.o. F) ?Treating RN: Carlene Coria ?Primary Care Josia Cueva: [name] Other Clinician: ?Referring Matvey Llanas: [name] ?Treating  Loyde Orth/Extender: Kalman Shan ?Weeks in Treatment: 54 ?Clinic Level of Care Assessment Items ?TOOL 4 Quantity Score ?'[]'$  - Use when only an EandM is performed on FOLLOW-UP visit 0 ?ASSESSMENTS - Nursing Assessment / Reassessment ?'[]'$  - Reassessment of Co-morbidities (includes updates in patient status) 0 ?'[]'$  - 0 ?Reassessment of Adherence to Treatment Plan ?ASSESSMENTS - Wound and Skin Assessment / Reassessment ?'[]'$  - Simple Wound Assessment / Reassessment - one wound 0 ?'[]'$  - 0 ?Complex Wound Assessment / Reassessment - multiple wounds ?'[]'$  - 0 ?Dermatologic / Skin Assessment (not related to wound area) ?ASSESSMENTS - Focused Assessment ?'[]'$  - Circumferential Edema Measurements - multi extremities 0 ?'[]'$  - 0 ?Nutritional Assessment / Counseling / Intervention ?'[]'$  - 0 ?Lower Extremity Assessment (monofilament, tuning fork, pulses) ?'[]'$  - 0 ?Peripheral Arterial Disease Assessment (using hand held doppler) ?ASSESSMENTS - Ostomy and/or Continence Assessment and Care ?'[]'$  - Incontinence Assessment and Management 0 ?'[]'$  - 0 ?Ostomy Care Assessment and Management (repouching, etc.) ?PROCESS - Coordination of Care ?'[]'$  - Simple Patient / Family Education for ongoing care 0 ?'[]'$  - 0 ?Complex (extensive) Patient / Family Education for ongoing care ?'[]'$  - 0 ?Staff obtains Consents, Records, Test Results / Process Orders ?'[]'$  - 0 ?Staff telephones HHA, Nursing Homes / Clarify orders / etc ?'[]'$  - 0 ?Routine Transfer to another Facility (non-emergent condition) ?'[]'$  - 0 ?Routine Hospital Admission (non-emergent condition) ?'[]'$  - 0 ?New Admissions / Biomedical engineer / Ordering NPWT, Apligraf, etc. ?'[]'$  - 0 ?Emergency Hospital Admission (emergent condition) ?'[]'$  - 0 ?Simple Discharge Coordination ?'[]'$  - 0 ?Complex (extensive) Discharge Coordination ?PROCESS - Special Needs ?'[]'$  - Pediatric / Minor Patient Management 0 ?'[]'$  - 0 ?Isolation Patient Management ?'[]'$  - 0 ?Hearing / Language / Visual special needs ?'[]'$  - 0 ?Assessment of Community  assistance (transportation, D/C planning, etc.) ?'[]'$  - 0 ?Additional assistance / Altered mentation ?'[]'$  - 0 ?Support Surface(s) Assessment (bed, cushion, seat, etc.) ?INTERVENTIONS - Wound Cleansing / Measurement ?[place], [place] ([id number]) ?'[]'$  - 0 ?Simple  Wound Cleansing - one wound ?'[]'$  - 0 ?Complex Wound Cleansing - multiple wounds ?'[]'$  - 0 ?Wound Imaging (photographs - any number of wounds) ?'[]'$  - 0 ?Wound Tracing (instead of photographs) ?'[]'$  - 0 ?Simple Wound Measurement - one wound ?'[]'$  - 0 ?Complex Wound Measurement - multiple wounds ?INTERVENTIONS - Wound Dressings ?'[]'$  - Small Wound Dressing one or multiple wounds 0 ?'[]'$  - 0 ?Medium Wound Dressing one or multiple wounds ?'[]'$  - 0 ?Large Wound Dressing one or multiple wounds ?'[]'$  - 0 ?Application of Medications - topical ?'[]'$  - 0 ?Application of Medications - injection ?INTERVENTIONS - Miscellaneous ?'[]'$  - External ear exam 0 ?'[]'$  - 0 ?Specimen Collection (cultures, biopsies, blood, body fluids, etc.) ?'[]'$  - 0 ?Specimen(s) / Culture(s) sent or taken to Lab for analysis ?'[]'$  - 0 ?Patient Transfer (multiple staff / Civil Service fast streamer / Similar devices) ?'[]'$  - 0 ?Simple Staple / Suture removal (25 or less) ?'[]'$  - 0 ?Complex Staple / Suture removal (26 or more) ?'[]'$  - 0 ?Hypo / Hyperglycemic Management (close monitor of Blood Glucose) ?'[]'$  - 0 ?Ankle / Brachial Index (ABI) - do not check if billed separately ?'[]'$  - 0 ?Vital Signs ?Has the patient been seen at the hospital within the last three years: Yes ?Total Score: 0 ?Level Of Care: ____ ?Electronic Signature(s) ?Signed: 11/22/2021 4:20:50 PM By: Carlene Coria RN ?Entered By: Carlene Coria on 11/19/2021 16:03:46 ?[place], [place] ([id number]) ?-------------------------------------------------------------------------------- ?Encounter Discharge Information Details ?Patient Name: [name], AGRESTI ?Date of Service: 11/19/2021 2:45 PM ?Medical Record Number: [id number] ?Patient Account Number: [id number] ?Date of Birth/Sex: 06-03-1946 (76 y.o.  F) ?Treating RN: Carlene Coria ?Primary Care Autymn Omlor: [name] Other Clinician: ?Referring Yakira Duquette: [name] ?Treating Shanetta Nicolls/Extender: Kalman Shan ?Weeks in Treatment: 46 ?Encounter Discharge Information Items Post Procedure Vitals ?Discharge Condition: Stable ?Temperature (?F): 97.9 ?Ambulatory Status: Wheelchair ?Pulse (bpm): 71 ?Discharge Destination: Home ?Respiratory Rate (breaths/min): 18 ?Transportation: Private Auto ?Blood Pressure (mmHg): 201/99 ?Accompanied By: self ?Schedule Follow-up Appointment: Yes ?Clinical Summary of Care: Patient Declined ?Electronic Signature(s) ?Signed: 11/22/2021 4:20:50 PM By: Carlene Coria RN ?Entered By: Carlene Coria on 11/19/2021 15:30:49 ?[place], [place] ([id number]) ?-------------------------------------------------------------------------------- ?Lower Extremity Assessment Details ?Patient Name: [name], BOUDREAU ?Date of Service: 11/19/2021 2:45 PM ?Medical Record Number: [id number] ?Patient Account Number: [id number] ?Date of Birth/Sex: [birth year] (76 y.o. F) ?Treating RN: Carlene Coria ?Primary Care Darrell Leonhardt: [name] Other Clinician: ?Referring Maanvi Lecompte: [name] ?Treating Jaqulyn Chancellor/Extender: Kalman Shan ?Weeks in Treatment: 56 ?Edema Assessment ?Assessed: [Left: No] [Right: No] ?Edema: [Left: Yes] [Right: Yes] ?Calf ?Left: Right: ?Point of Measurement: 31 cm From Medial Instep 35 cm 39 cm ?Ankle ?Left: Right: ?Point of Measurement: 10 cm From Medial Instep 25 cm 25 cm ?Vascular Assessment ?Pulses: ?Dorsalis Pedis ?Palpable: [Left:Yes Yes] [Right:Yes Yes] ?Electronic Signature(s) ?Signed: 11/22/2021 4:20:50 PM By: Carlene Coria RN ?Entered By: Carlene Coria on 11/19/2021 15:20:17 ?[place], [place] ([id number]) ?-------------------------------------------------------------------------------- ?Multi Wound Chart Details ?Patient Name: [name], COUILLARD ?Date of Service: 11/19/2021 2:45 PM ?Medical Record Number: [id number] ?Patient Account Number:  [id number] ?Date of Birth/Sex: Nov 01, 1945 (76 y.o. F) ?Treating RN: Carlene Coria ?Primary Care Merlean Pizzini: [name] Other Clinician: ?Referring Kyrie Bun: [name] ?Treating Jessah Danser/Extender: Kalman Shan ?Weeks

## 2021-11-22 NOTE — Progress Notes (Signed)
[place], [place] ([id number]) ?Visit Report for 11/19/2021 ?Chief Complaint Document Details ?Patient Name: [name], [name] ?Date of Service: 11/19/2021 2:45 PM ?Medical Record Number: [id number] ?Patient Account Number: [id number] ?Date of Birth/Sex: 1946-01-02 (76 y.o. F) ?Treating RN: Carlene Coria ?Primary Care Provider: [name] Other Clinician: ?Referring Provider: [name] ?Treating Provider/Extender: Kalman Shan ?Weeks in Treatment: 21 ?Information Obtained from: Patient ?Chief Complaint ?Bilateral LE Ulcers ?Electronic Signature(s) ?Signed: 11/19/2021 3:39:13 PM By: Kalman Shan DO ?Entered By: Kalman Shan on 11/19/2021 15:29:25 Wynetta Emery, [name] ([id number]) ?-------------------------------------------------------------------------------- ?Debridement Details ?Patient Name: [name], [name] ?Date of Service: 11/19/2021 2:45 PM ?Medical Record Number: [id number] ?Patient Account Number: [id number] ?Date of Birth/Sex: Apr 27, 1946 (76 y.o. F) ?Treating RN: Carlene Coria ?Primary Care Provider: [name] Other Clinician: ?Referring Provider: [name] ?Treating Provider/Extender: Kalman Shan ?Weeks in Treatment: 70 ?Debridement Performed for ?Wound #3 Left Lower Leg ?Assessment: ?Performed By: Physician Kalman Shan, MD ?Debridement Type: Debridement ?Level of Consciousness (Pre- ?Awake and Alert ?procedure): ?Pre-procedure Verification/Time Out ?Yes - 13:25 ?Taken: ?Start Time: 13:25 ?Total Area Debrided (L x W): 1.5 (cm) x 2 (cm) = 3 (cm?) ?Tissue and other material ?Viable, Non-Viable, Slough, Subcutaneous, Biofilm, Slough ?debrided: ?Level: Skin/Subcutaneous Tissue ?Debridement Description: Excisional ?Instrument: Curette ?Bleeding: Minimum ?Hemostasis Achieved: Pressure ?End Time: 15:29 ?Procedural Pain: 0 ?Post Procedural Pain: 0 ?Response to Treatment: Procedure was tolerated well ?Level of Consciousness (Post- ?Awake and Alert ?procedure): ?Post Debridement Measurements of  Total Wound ?Length: (cm) 1.5 ?Width: (cm) 2 ?Depth: (cm) 0.1 ?Volume: (cm?) 0.236 ?Character of Wound/Ulcer Post Debridement: Improved ?Post Procedure Diagnosis ?Same as Pre-procedure ?Electronic Signature(s) ?Signed: 11/19/2021 3:39:13 PM By: Kalman Shan DO ?Signed: 11/22/2021 4:20:50 PM By: Carlene Coria RN ?Entered By: Carlene Coria on 11/19/2021 15:28:09 ?[place], [name] ([id number]) ?-------------------------------------------------------------------------------- ?Debridement Details ?Patient Name: [name], [name] ?Date of Service: 11/19/2021 2:45 PM ?Medical Record Number: [id number] ?Patient Account Number: [id number] ?Date of Birth/Sex: [birth year] (76 y.o. F) ?Treating RN: Carlene Coria ?Primary Care Provider: [name] Other Clinician: ?Referring Provider: [name] ?Treating Provider/Extender: Kalman Shan ?Weeks in Treatment: 67 ?Debridement Performed for ?Wound #4 Right,Lateral Lower Leg ?Assessment: ?Performed By: Physician Kalman Shan, MD ?Debridement Type: Debridement ?Severity of Tissue Pre Debridement: Fat layer exposed ?Level of Consciousness (Pre- ?Awake and Alert ?procedure): ?Pre-procedure Verification/Time Out ?Yes - 13:25 ?Taken: ?Start Time: 13:25 ?Total Area Debrided (L x W): 2.5 (cm) x 3 (cm) = 7.5 (cm?) ?Tissue and other material ?Viable, Non-Viable, Slough, Subcutaneous, Biofilm, Slough ?debrided: ?Level: Skin/Subcutaneous Tissue ?Debridement Description: Excisional ?Instrument: Curette ?Bleeding: Minimum ?Hemostasis Achieved: Pressure ?End Time: 15:29 ?Procedural Pain: 0 ?Post Procedural Pain: 0 ?Response to Treatment: Procedure was tolerated well ?Level of Consciousness (Post- ?Awake and Alert ?procedure): ?Post Debridement Measurements of Total Wound ?Length: (cm) 2.5 ?Width: (cm) 3 ?Depth: (cm) 0.1 ?Volume: (cm?) 0.589 ?Character of Wound/Ulcer Post Debridement: Improved ?Severity of Tissue Post Debridement: Fat layer exposed ?Post Procedure Diagnosis ?Same as  Pre-procedure ?Electronic Signature(s) ?Signed: 11/19/2021 3:39:13 PM By: Kalman Shan DO ?Signed: 11/22/2021 4:20:50 PM By: Carlene Coria RN ?Entered By: Carlene Coria on 11/19/2021 15:29:05 ?[name], [name] ([id number]) ?-------------------------------------------------------------------------------- ?HPI Details ?Patient Name: [name], [name] ?Date of Service: 11/19/2021 2:45 PM ?Medical Record Number: [id number] ?Patient Account Number: [id number] ?Date of Birth/Sex: 11/29/1945 (76 y.o. F) ?Treating RN: Carlene Coria ?Primary Care Provider: [name] Other Clinician: ?Referring Provider: [name] ?Treating Provider/Extender: Kalman Shan ?Weeks in Treatment: 61 ?History of Present Illness ?HPI Description: 03/15/2020 upon evaluation today patient presents today for initial evaluation here in our clinic concerning a wound that is on ?the  left posterior lower extremity. Unfortunately this has been giving the patient some discomfort at this point which she notes has been affected ?her pretty much daily. The patient does have a history of venous insufficiency, hypertension, congestive heart failure, and a history of breast ?cancer. Fortunately there does not appear to be evidence of active infection at this time which is great news. No fevers, chills, nausea, vomiting, ?or diarrhea. Wound is not extremely large but does have some slough covering the surface of the wound. This is going require sharp debridement ?today. ?03/15/2020 upon evaluation today patient appears to be doing fairly well in regard to her wound. She does have some slough noted on the surface ?of the wound currently but I do believe the Iodoflex has been beneficial over the past week. There is no signs of active infection at this time which ?is great news and overall very pleased with the progress. No fevers, chills, nausea, vomiting, or diarrhea. ?04/02/2020 upon evaluation today patient appears to be doing a little better in regard to her  wound size wise this is not tremendously smaller she ?does have some slough buildup on the surface of the wound. With that being said this is going require some sharp debridement today to clear ?away some of the necrotic debris. Fortunately there is no evidence of active infection at this time. No fevers, chills, nausea, vomiting, or diarrhea. ?04/10/2020 on evaluation today patient appears to be doing well with regard to her ulcer which is measuring somewhat smaller today. She actually ?has more granulation tissue noted which is also good news is no need for sharp debridement today. Fortunately there is no evidence of infection ?either which is also excellent. No fevers, chills, nausea, vomiting, or diarrhea. ?04/26/2020 on evaluation today patient's wound actually is showing signs of good improvement which is great news. There does not appear to be ?any evidence of active infection. I do believe she is tolerating the collagen at this point. ?05/03/2020 upon evaluation today patient's wound actually showed signs of good granulation at this time there does not appear to be any ?evidence of active infection which is great news and overall very pleased with where things stand. With that being said I do believe that the ?patient is can require some sharp debridement today but fortunately nothing too significant. ?05/11/20 on evaluation today patient appears to be doing well in regard to her leg ulcer. She's been tolerating the dressing changes without ?complication. Fortunately there is no signs of active infection at this time. Overall I'm very pleased with where things stand. ?05/18/2020 upon evaluation today patient's wound is showing signs of improvement is very dry and I think the collagen for the most part just ?dried to the wound bed. I am going to clean this away with sharp debridement in order to get this under control in my opinion. ?05/25/2020 upon evaluation today patient actually appears to be doing well in regard  to her leg ulcer. In fact upon inspection it appears she could ?potentially be completely healed but that is not guaranteed based on what we are seeing. There does not appear to be any signs of active ?infection w

## 2021-11-25 ENCOUNTER — Telehealth: Payer: Self-pay | Admitting: *Deleted

## 2021-11-25 NOTE — Telephone Encounter (Signed)
Patient called to cancel her appointment tomorrow with Beckey Rutter NP she has a conflict with her wound center appointment. ?

## 2021-11-26 ENCOUNTER — Other Ambulatory Visit: Payer: Self-pay

## 2021-11-26 ENCOUNTER — Inpatient Hospital Stay: Payer: Medicare Other | Admitting: Nurse Practitioner

## 2021-11-26 ENCOUNTER — Encounter: Payer: Medicare Other | Admitting: Physician Assistant

## 2021-11-26 DIAGNOSIS — I890 Lymphedema, not elsewhere classified: Secondary | ICD-10-CM | POA: Diagnosis not present

## 2021-11-26 NOTE — Progress Notes (Addendum)
[place], [place] ([id number]) ?Visit Report for 11/26/2021 ?Chief Complaint Document Details ?Patient Name: NEVILLE, PAULS ?Date of Service: 11/26/2021 2:45 PM ?Medical Record Number: [id number] ?Patient Account Number: [id number] ?Date of Birth/Sex: 09/30/1945 (76 y.o. F) ?Treating RN: Carlene Coria ?Primary Care Provider: [name] Other Clinician: ?Referring Provider: [name] ?Treating Provider/Extender: [name] ?Weeks in Treatment: 44 ?Information Obtained from: Patient ?Chief Complaint ?Bilateral LE Ulcers ?Electronic Signature(s) ?Signed: 11/26/2021 2:53:32 PM By: Worthy Keeler PA-C ?Entered By: Worthy Keeler on 11/26/2021 14:53:32 ?NAYSHA, SHOLL ([id number]) ?-------------------------------------------------------------------------------- ?HPI Details ?Patient Name: REMEDIOS, MCKONE ?Date of Service: 11/26/2021 2:45 PM ?Medical Record Number: [id number] ?Patient Account Number: [id number] ?Date of Birth/Sex: 05-01-1946 (76 y.o. F) ?Treating RN: Carlene Coria ?Primary Care Provider: [name] Other Clinician: ?Referring Provider: [name] ?Treating Provider/Extender: [name] ?Weeks in Treatment: 44 ?History of Present Illness ?HPI Description: 03/15/2020 upon evaluation today patient presents today for initial evaluation here in our clinic concerning a wound that is on ?the left posterior lower extremity. Unfortunately this has been giving the patient some discomfort at this point which she notes has been affected ?her pretty much daily. The patient does have a history of venous insufficiency, hypertension, congestive heart failure, and a history of breast ?cancer. Fortunately there does not appear to be evidence of active infection at this time which is great news. No fevers, chills, nausea, vomiting, ?or diarrhea. Wound is not extremely large but does have some slough covering the surface of the wound. This is going require sharp debridement ?today. ?03/15/2020 upon evaluation today patient  appears to be doing fairly well in regard to her wound. She does have some slough noted on the surface ?of the wound currently but I do believe the Iodoflex has been beneficial over the past week. There is no signs of active infection at this time which ?is great news and overall very pleased with the progress. No fevers, chills, nausea, vomiting, or diarrhea. ?04/02/2020 upon evaluation today patient appears to be doing a little better in regard to her wound size wise this is not tremendously smaller she ?does have some slough buildup on the surface of the wound. With that being said this is going require some sharp debridement today to clear ?away some of the necrotic debris. Fortunately there is no evidence of active infection at this time. No fevers, chills, nausea, vomiting, or diarrhea. ?04/10/2020 on evaluation today patient appears to be doing well with regard to her ulcer which is measuring somewhat smaller today. She actually ?has more granulation tissue noted which is also good news is no need for sharp debridement today. Fortunately there is no evidence of infection ?either which is also excellent. No fevers, chills, nausea, vomiting, or diarrhea. ?04/26/2020 on evaluation today patient's wound actually is showing signs of good improvement which is great news. There does not appear to be ?any evidence of active infection. I do believe she is tolerating the collagen at this point. ?05/03/2020 upon evaluation today patient's wound actually showed signs of good granulation at this time there does not appear to be any ?evidence of active infection which is great news and overall very pleased with where things stand. With that being said I do believe that the ?patient is can require some sharp debridement today but fortunately nothing too significant. ?05/11/20 on evaluation today patient appears to be doing well in regard to her leg ulcer. She's been tolerating the dressing changes without ?complication.  Fortunately there is no signs of active infection at this time.  Overall I'm very pleased with where things stand. ?05/18/2020 upon evaluation today patient's wound is showing signs of improvement is very dry and I think the collagen for the most part just ?dried to the wound bed. I am going to clean this away with sharp debridement in order to get this under control in my opinion. ?05/25/2020 upon evaluation today patient actually appears to be doing well in regard to her leg ulcer. In fact upon inspection it appears she could ?potentially be completely healed but that is not guaranteed based on what we are seeing. There does not appear to be any signs of active ?infection which is great news. ?05/31/2020 on evaluation today patient appears to be doing well with regard to her wounds. She in fact appears to be almost completely healed on ?the left leg there is just a very small opening remaining point 1 ?06/08/2020 upon evaluation today patient appears to be doing excellent in regard to her leg ulcer on the left in fact it appears to be that she is ?completely healed as of today. I see no signs of active infection at this time which is great news. No fevers, chills, nausea, vomiting, or diarrhea. ?READMISSION ?01/16/2021 ?Patient that we have had in this clinic with a wound on the left posterior calf in the summer 2021 extending into October. She was felt to have ?chronic venous insufficiency. Per the patient's description she was discharged with what sounds like external compression stockings although she ?could not afford the "$75 per leg". She therefore did not get anything. Apparently her wound that she has currently is been in existence since ?January. She has been followed at vein and vascular with Unna boots and I think calcium alginate. She had ABIs done on 06/25/2020 that showed ?an noncompressible ABI on the right leg and the left leg but triphasic waveforms with good great toe pressures and a TBI of 0.90 on the  right and ?0.91 on the left ?Surprisingly the patient also had venous reflux studies that were really unremarkable. This included no evidence of DVTs or SVTs but there was no ?evidence of deep venous insufficiency or superficial venous insufficiency in the greater saphenous or short saphenous veins bilaterally. I would ?wonder about more central venous issues or perhaps this is all lymphedema ?01/25/2021 upon evaluation today patient appears to be doing decently well and guard to her wound. The good news is the Iodoflex that is job she ?saw Dr. Dellia Nims last week for readmission and to be honest I think that this has done extremely well over that week. I do believe that she can ?tolerate the sharp debridement today which will be good. I think that cleaning off the wound will likely allow [place] to be able to use a different type ?of dressing I do not think the Iodoflex will even be necessary based on how clean the wound looks. Fortunately there is no sign of active infection ?at this time which is great news. No fevers, chills, nausea, vomiting, or diarrhea. ?02/12/2021 upon evaluation today patient appears to be doing well with regard to her leg ulcer. We did switch to alginate when she came for nurse ?visit I think is doing much better for her. Fortunately there does not appear to be any signs of active infection at this time which is great news. No ?fevers, chills, nausea, vomiting, or diarrhea. ?[place], [place] ([id number]) ?02/18/2021 upon evaluation today patient's wound is actually showing signs of good improvement. I am very pleased with where things stand ?  currently. I do not see any signs of active infection which is great news and overall I feel like she is making good progress. The patient likewise is ?happy that things are doing so well and that this is measuring somewhat smaller. ?02/25/2021 upon evaluation today patient actually appears to be doing quite well in regard to her wounds. She has been tolerating the  dressing ?changes without complication. Fortunately there does not appear to be any signs of active infection which is great news and overall I am ?extremely pleased with where things stand. No fevers, chill

## 2021-11-26 NOTE — Progress Notes (Signed)
[place], [place] ([id number]) ?Visit Report for 11/26/2021 ?Arrival Information Details ?Patient Name: [name], [name] ?Date of Service: 11/26/2021 2:45 PM ?Medical Record Number: [id number] ?Patient Account Number: [id number] ?Date of Birth/Sex: September 09, 1945 (76 y.o. F) ?Treating RN: Carlene Coria ?Primary Care Peony Barner: [name] Other Clinician: ?Referring Nyaisha Simao: [name] ?Treating Makinna Andy/Extender: [name] ?Weeks in Treatment: 44 ?Visit Information History Since Last Visit ?All ordered tests and consults were completed: No ?Patient Arrived: Wheel Chair ?Added or deleted any medications: No ?Arrival Time: 14:45 ?Any new allergies or adverse reactions: No ?Accompanied By: self ?Had a fall or experienced change in No ?Transfer Assistance: None ?activities of daily living that may affect ?Patient Identification Verified: Yes ?risk of falls: ?Secondary Verification Process Completed: Yes ?Signs or symptoms of abuse/neglect since last visito No ?Patient Requires Transmission-Based Precautions: No ?Hospitalized since last visit: No ?Patient Has Alerts: Yes ?Implantable device outside of the clinic excluding No ?Patient Alerts: NOT DIABETIC ?cellular tissue based products placed in the center ?since last visit: ?Has Dressing in Place as Prescribed: Yes ?Pain Present Now: No ?Electronic Signature(s) ?Signed: 11/26/2021 4:35:36 PM By: Carlene Coria RN ?Entered By: Carlene Coria on 11/26/2021 14:50:34 ?[place], [place] ([id number]) ?-------------------------------------------------------------------------------- ?Clinic Level of Care Assessment Details ?Patient Name: [name], [name] ?Date of Service: 11/26/2021 2:45 PM ?Medical Record Number: [id number] ?Patient Account Number: [id number] ?Date of Birth/Sex: 1946/02/26 (76 y.o. F) ?Treating RN: Carlene Coria ?Primary Care Moranda Billiot: [name] Other Clinician: ?Referring Sol Englert: [name] ?Treating Mehak Roskelley/Extender: [name] ?Weeks in Treatment: 44 ?Clinic  Level of Care Assessment Items ?TOOL 1 Quantity Score ?'[]'$  - Use when EandM and Procedure is performed on INITIAL visit 0 ?ASSESSMENTS - Nursing Assessment / Reassessment ?'[]'$  - General Physical Exam (combine w/ comprehensive assessment (listed just below) when performed on new ?0 ?pt. evals) ?'[]'$  - 0 ?Comprehensive Assessment (HX, ROS, Risk Assessments, Wounds Hx, etc.) ?ASSESSMENTS - Wound and Skin Assessment / Reassessment ?'[]'$  - Dermatologic / Skin Assessment (not related to wound area) 0 ?ASSESSMENTS - Ostomy and/or Continence Assessment and Care ?'[]'$  - Incontinence Assessment and Management 0 ?'[]'$  - 0 ?Ostomy Care Assessment and Management (repouching, etc.) ?PROCESS - Coordination of Care ?'[]'$  - Simple Patient / Family Education for ongoing care 0 ?'[]'$  - 0 ?Complex (extensive) Patient / Family Education for ongoing care ?'[]'$  - 0 ?Staff obtains Consents, Records, Test Results / Process Orders ?'[]'$  - 0 ?Staff telephones HHA, Nursing Homes / Clarify orders / etc ?'[]'$  - 0 ?Routine Transfer to another Facility (non-emergent condition) ?'[]'$  - 0 ?Routine Hospital Admission (non-emergent condition) ?'[]'$  - 0 ?New Admissions / Biomedical engineer / Ordering NPWT, Apligraf, etc. ?'[]'$  - 0 ?Emergency Hospital Admission (emergent condition) ?PROCESS - Special Needs ?'[]'$  - Pediatric / Minor Patient Management 0 ?'[]'$  - 0 ?Isolation Patient Management ?'[]'$  - 0 ?Hearing / Language / Visual special needs ?'[]'$  - 0 ?Assessment of Community assistance (transportation, D/C planning, etc.) ?'[]'$  - 0 ?Additional assistance / Altered mentation ?'[]'$  - 0 ?Support Surface(s) Assessment (bed, cushion, seat, etc.) ?INTERVENTIONS - Miscellaneous ?'[]'$  - External ear exam 0 ?'[]'$  - 0 ?Patient Transfer (multiple staff / Civil Service fast streamer / Similar devices) ?'[]'$  - 0 ?Simple Staple / Suture removal (25 or less) ?'[]'$  - 0 ?Complex Staple / Suture removal (26 or more) ?'[]'$  - 0 ?Hypo/Hyperglycemic Management (do not check if billed separately) ?'[]'$  - 0 ?Ankle / Brachial Index  (ABI) - do not check if billed separately ?Has the patient been seen at the hospital within the last three years: Yes ?Total Score: 0 ?  Level Of Care: ____ ?[place], [place] ([id number]) ?Electronic Signature(s) ?Signed: 11/26/2021 4:35:36 PM By: Carlene Coria RN ?Entered By: Carlene Coria on 11/26/2021 15:17:45 ?[place], [place] ([id number]) ?-------------------------------------------------------------------------------- ?Compression Therapy Details ?Patient Name: [name], [name] ?Date of Service: 11/26/2021 2:45 PM ?Medical Record Number: [id number] ?Patient Account Number: [id number] ?Date of Birth/Sex: 01-22-1946 (76 y.o. F) ?Treating RN: Carlene Coria ?Primary Care Lendon George: [name] Other Clinician: ?Referring Xayvion Shirah: [name] ?Treating Deriana Vanderhoef/Extender: [name] ?Weeks in Treatment: 44 ?Compression Therapy Performed for Wound Assessment: Wound #3 Left Lower Leg ?Performed By: Clinician Carlene Coria, RN ?Compression Type: Three Layer ?Post Procedure Diagnosis ?Same as Pre-procedure ?Electronic Signature(s) ?Signed: 11/26/2021 3:17:14 PM By: Carlene Coria RN ?Entered By: Carlene Coria on 11/26/2021 15:17:14 ?[place], [place] ([id number]) ?-------------------------------------------------------------------------------- ?Compression Therapy Details ?Patient Name: [name], [name] ?Date of Service: 11/26/2021 2:45 PM ?Medical Record Number: [id number] ?Patient Account Number: [id number] ?Date of Birth/Sex: 20-Jul-1946 (76 y.o. F) ?Treating RN: Carlene Coria ?Primary Care Elesia Pemberton: [name] Other Clinician: ?Referring Geovonni Meyerhoff: [name] ?Treating Damita Eppard/Extender: [name] ?Weeks in Treatment: 44 ?Compression Therapy Performed for Wound Assessment: Wound #4 Right,Lateral Lower Leg ?Performed By: Clinician Carlene Coria, RN ?Compression Type: Three Layer ?Post Procedure Diagnosis ?Same as Pre-procedure ?Electronic Signature(s) ?Signed: 11/26/2021 3:17:35 PM By: Carlene Coria RN ?Entered By: Carlene Coria on  11/26/2021 15:17:34 ?[place], Shalisa ([id number]) ?-------------------------------------------------------------------------------- ?Encounter Discharge Information Details ?Patient Name: [name], [name] ?Date of Service: 11/26/2021 2:45 PM ?Medical Record Number: [id number] ?Patient Account Number: [id number] ?Date of Birth/Sex: [birth year] (76 y.o. F) ?Treating RN: Carlene Coria ?Primary Care Lynnea Vandervoort: [name] Other Clinician: ?Referring Gerrie Castiglia: [name] ?Treating Jamani Bearce/Extender: [name] ?Weeks in Treatment: 44 ?Encounter Discharge Information Items ?Discharge Condition: Stable ?Ambulatory Status: Wheelchair ?Discharge Destination: Home ?Transportation: Private Auto ?Accompanied By: self ?Schedule Follow-up Appointment: Yes ?Clinical Summary of Care: Patient Declined ?Electronic Signature(s) ?Signed: 11/26/2021 4:35:36 PM By: Carlene Coria RN ?Entered By: Carlene Coria on 11/26/2021 15:40:19 ?[place], [place] ([id number]) ?-------------------------------------------------------------------------------- ?Lower Extremity Assessment Details ?Patient Name: [name], [name] ?Date of Service: 11/26/2021 2:45 PM ?Medical Record Number: [id number] ?Patient Account Number: [id number] ?Date of Birth/Sex: 01-26-1946 (76 y.o. F) ?Treating RN: Carlene Coria ?Primary Care Granite Godman: [name] Other Clinician: ?Referring Isra Lindy: [name] ?Treating Corrinne Benegas/Extender: [name] ?Weeks in Treatment: 44 ?Edema Assessment ?Assessed: [Left: No] [Right: No] ?Edema: [Left: Yes] [Right: Yes] ?Calf ?Left: Right: ?Point of Measurement: 31 cm From Medial Instep 35 cm 39 cm ?Ankle ?Left: Right: ?Point of Measurement: 10 cm From Medial Instep 25 cm 25 cm ?Electronic Signature(s) ?Signed: 11/26/2021 4:35:36 PM By: Carlene Coria RN ?Entered By: Carlene Coria on 11/26/2021 15:03:25 ?[employer] [facility], [place] ([id number]) ?-------------------------------------------------------------------------------- ?Multi Wound Chart Details ?Patient  Name: [name], [name] ?Date of Service: 11/26/2021 2:45 PM ?Medical Record Number: [id number] ?Patient Account Number: [id number] ?Date of Birth/Sex: 1945-10-18 (76 y.o. F) ?Treating RN: Carlene Coria ?Primary

## 2021-12-03 ENCOUNTER — Other Ambulatory Visit: Payer: Self-pay

## 2021-12-03 ENCOUNTER — Encounter: Payer: Medicare Other | Admitting: Physician Assistant

## 2021-12-03 DIAGNOSIS — I890 Lymphedema, not elsewhere classified: Secondary | ICD-10-CM | POA: Diagnosis not present

## 2021-12-03 NOTE — Progress Notes (Addendum)
[place], [place] ([id number]) ?Visit Report for 12/03/2021 ?Arrival Information Details ?Patient Name: [name], [name] ?Date of Service: 12/03/2021 2:45 PM ?Medical Record Number: [id number] ?Patient Account Number: [id number] ?Date of Birth/Sex: [birth year] (76 y.o. F) ?Treating RN: Carlene Coria ?Primary Care Brunella Wileman: [name] Other Clinician: ?Referring Santiaga Butzin: [name] ?Treating Gabbrielle Mcnicholas/Extender: [name] ?Weeks in Treatment: 19 ?Visit Information History Since Last Visit ?All ordered tests and consults were completed: No ?Patient Arrived: Wheel Chair ?Added or deleted any medications: No ?Arrival Time: 14:41 ?Any new allergies or adverse reactions: No ?Accompanied By: self ?Had a fall or experienced change in No ?Transfer Assistance: None ?activities of daily living that may affect ?Patient Identification Verified: Yes ?risk of falls: ?Secondary Verification Process Completed: Yes ?Signs or symptoms of abuse/neglect since last visito No ?Patient Requires Transmission-Based Precautions: No ?Hospitalized since last visit: No ?Patient Has Alerts: Yes ?Implantable device outside of the clinic excluding No ?Patient Alerts: NOT DIABETIC ?cellular tissue based products placed in the center ?since last visit: ?Has Dressing in Place as Prescribed: Yes ?Has Compression in Place as Prescribed: Yes ?Pain Present Now: No ?Electronic Signature(s) ?Signed: 12/03/2021 4:01:37 PM By: Carlene Coria RN ?Entered By: Carlene Coria on 12/03/2021 14:48:41 ?[place], [name] ([id number]) ?-------------------------------------------------------------------------------- ?Clinic Level of Care Assessment Details ?Patient Name: [name], [name] ?Date of Service: 12/03/2021 2:45 PM ?Medical Record Number: [id number] ?Patient Account Number: [id number] ?Date of Birth/Sex: 12-24-1945 (75 y.o. F) ?Treating RN: Carlene Coria ?Primary Care Jarion Hawthorne: [name] Other Clinician: ?Referring Andrei Mccook: [name] ?Treating Kattia Selley/Extender:  [name] ?Weeks in Treatment: 41 ?Clinic Level of Care Assessment Items ?TOOL 1 Quantity Score ?'[]'$  - Use when EandM and Procedure is performed on INITIAL visit 0 ?ASSESSMENTS - Nursing Assessment / Reassessment ?'[]'$  - General Physical Exam (combine w/ comprehensive assessment (listed just below) when performed on new ?0 ?pt. evals) ?'[]'$  - 0 ?Comprehensive Assessment (HX, ROS, Risk Assessments, Wounds Hx, etc.) ?ASSESSMENTS - Wound and Skin Assessment / Reassessment ?'[]'$  - Dermatologic / Skin Assessment (not related to wound area) 0 ?ASSESSMENTS - Ostomy and/or Continence Assessment and Care ?'[]'$  - Incontinence Assessment and Management 0 ?'[]'$  - 0 ?Ostomy Care Assessment and Management (repouching, etc.) ?PROCESS - Coordination of Care ?'[]'$  - Simple Patient / Family Education for ongoing care 0 ?'[]'$  - 0 ?Complex (extensive) Patient / Family Education for ongoing care ?'[]'$  - 0 ?Staff obtains Consents, Records, Test Results / Process Orders ?'[]'$  - 0 ?Staff telephones [place], Nursing Homes / Clarify orders / etc ?'[]'$  - 0 ?Routine Transfer to another Facility (non-emergent condition) ?'[]'$  - 0 ?Routine Hospital Admission (non-emergent condition) ?'[]'$  - 0 ?New Admissions / Biomedical engineer / Ordering NPWT, Apligraf, etc. ?'[]'$  - 0 ?Emergency Hospital Admission (emergent condition) ?PROCESS - Special Needs ?'[]'$  - Pediatric / Minor Patient Management 0 ?'[]'$  - 0 ?Isolation Patient Management ?'[]'$  - 0 ?Hearing / Language / Visual special needs ?'[]'$  - 0 ?Assessment of Community assistance (transportation, D/C planning, etc.) ?'[]'$  - 0 ?Additional assistance / Altered mentation ?'[]'$  - 0 ?Support Surface(s) Assessment (bed, cushion, seat, etc.) ?INTERVENTIONS - Miscellaneous ?'[]'$  - External ear exam 0 ?'[]'$  - 0 ?Patient Transfer (multiple staff / Civil Service fast streamer / Similar devices) ?'[]'$  - 0 ?Simple Staple / Suture removal (25 or less) ?'[]'$  - 0 ?Complex Staple / Suture removal (26 or more) ?'[]'$  - 0 ?Hypo/Hyperglycemic Management (do not check if billed  separately) ?'[]'$  - 0 ?Ankle / Brachial Index (ABI) - do not check if billed separately ?Has the patient been seen at the hospital within the  last three years: Yes ?Total Score: 0 ?Level Of Care: ____ ?[place], [place] ([id number]) ?Electronic Signature(s) ?Signed: 12/03/2021 4:01:37 PM By: Carlene Coria RN ?Entered By: Carlene Coria on 12/03/2021 15:12:00 ?[place], [name] ([id number]) ?-------------------------------------------------------------------------------- ?Compression Therapy Details ?Patient Name: [name], [name] ?Date of Service: 12/03/2021 2:45 PM ?Medical Record Number: [id number] ?Patient Account Number: [id number] ?Date of Birth/Sex: 04-24-1946 (76 y.o. F) ?Treating RN: Carlene Coria ?Primary Care Giannie Soliday: [name] Other Clinician: ?Referring Adanna Zuckerman: [name] ?Treating Gauri Galvao/Extender: [name] ?Weeks in Treatment: 86 ?Compression Therapy Performed for Wound Assessment: Wound #3 Left Lower Leg ?Performed By: Clinician Carlene Coria, RN ?Compression Type: Three Layer ?Post Procedure Diagnosis ?Same as Pre-procedure ?Electronic Signature(s) ?Signed: 12/03/2021 4:01:37 PM By: Carlene Coria RN ?Entered By: Carlene Coria on 12/03/2021 15:10:18 ?[place], [name] ([id number]) ?-------------------------------------------------------------------------------- ?Compression Therapy Details ?Patient Name: [name], [name] ?Date of Service: 12/03/2021 2:45 PM ?Medical Record Number: [id number] ?Patient Account Number: [id number] ?Date of Birth/Sex: 07/18/1946 (76 y.o. F) ?Treating RN: Carlene Coria ?Primary Care Carrin Vannostrand: [name] Other Clinician: ?Referring Yaziel Brandon: [name] ?Treating Anaid Haney/Extender: [name] ?Weeks in Treatment: 43 ?Compression Therapy Performed for Wound Assessment: Wound #4 Right,Lateral Lower Leg ?Performed By: Clinician Carlene Coria, RN ?Compression Type: Three Layer ?Post Procedure Diagnosis ?Same as Pre-procedure ?Electronic Signature(s) ?Signed: 12/03/2021 4:01:37 PM By:  Carlene Coria RN ?Entered By: Carlene Coria on 12/03/2021 15:11:47 ?[place], [name] ([id number]) ?-------------------------------------------------------------------------------- ?Encounter Discharge Information Details ?Patient Name: [name], [name] ?Date of Service: 12/03/2021 2:45 PM ?Medical Record Number: [id number] ?Patient Account Number: [id number] ?Date of Birth/Sex: 06/30/1946 (76 y.o. F) ?Treating RN: Carlene Coria ?Primary Care Crucita Lacorte: [name] Other Clinician: ?Referring Mikko Lewellen: [name] ?Treating Tashara Suder/Extender: [name] ?Weeks in Treatment: 31 ?Encounter Discharge Information Items ?Discharge Condition: Stable ?Ambulatory Status: Wheelchair ?Discharge Destination: Home ?Transportation: Private Auto ?Accompanied By: self ?Schedule Follow-up Appointment: Yes ?Clinical Summary of Care: Patient Declined ?Electronic Signature(s) ?Signed: 12/03/2021 4:01:37 PM By: Carlene Coria RN ?Entered By: Carlene Coria on 12/03/2021 15:13:50 ?[place], [name] ([id number]) ?-------------------------------------------------------------------------------- ?Lower Extremity Assessment Details ?Patient Name: [name], [name] ?Date of Service: 12/03/2021 2:45 PM ?Medical Record Number: [id number] ?Patient Account Number: [id number] ?Date of Birth/Sex: 1946-06-07 (76 y.o. F) ?Treating RN: Carlene Coria ?Primary Care Jaycelyn Orrison: [name] Other Clinician: ?Referring Jakyren Fluegge: [name] ?Treating Margaretha Mahan/Extender: [name] ?Weeks in Treatment: 11 ?Edema Assessment ?Assessed: [Left: Yes] [Right: Yes] ?Edema: [Left: Yes] [Right: Yes] ?Calf ?Left: Right: ?Point of Measurement: 31 cm From Medial Instep 35 cm 39 cm ?Ankle ?Left: Right: ?Point of Measurement: 10 cm From Medial Instep 25 cm 25 cm ?Vascular Assessment ?Pulses: ?Dorsalis Pedis ?Palpable: [Left:Yes] [Right:Yes] ?Electronic Signature(s) ?Signed: 12/03/2021 4:01:37 PM By: Carlene Coria RN ?Entered By: Carlene Coria on 12/03/2021 15:00:10 ?[place], [name]  ([id number]) ?-------------------------------------------------------------------------------- ?Multi Wound Chart Details ?Patient Name: [name], [name] ?Date of Service: 12/03/2021 2:45 PM ?Medical Record Numb

## 2021-12-03 NOTE — Progress Notes (Addendum)
[place], [place] ([id number]) ?Visit Report for 12/03/2021 ?Chief Complaint Document Details ?Patient Name: [name], [name] ?Date of Service: 12/03/2021 2:45 PM ?Medical Record Number: [id number] ?Patient Account Number: [id number] ?Date of Birth/Sex: October 14, 1945 (76 y.o. F) ?Treating RN: Carlene Coria ?Primary Care Provider: [name] Other Clinician: ?Referring Provider: [name] ?Treating Provider/Extender: [name] ?Weeks in Treatment: 6 ?Information Obtained from: Patient ?Chief Complaint ?Bilateral LE Ulcers ?Electronic Signature(s) ?Signed: 12/03/2021 2:41:25 PM By: Worthy Keeler PA-C ?Entered By: Worthy Keeler on 12/03/2021 14:41:25 ?[name] ([id number]) ?-------------------------------------------------------------------------------- ?HPI Details ?Patient Name: [name] ?Date of Service: 12/03/2021 2:45 PM ?Medical Record Number: [id number] ?Patient Account Number: [id number] ?Date of Birth/Sex: 1946/01/17 (76 y.o. F) ?Treating RN: Carlene Coria ?Primary Care Provider: [name] Other Clinician: ?Referring Provider: [name] ?Treating Provider/Extender: [name] ?Weeks in Treatment: 32 ?History of Present Illness ?HPI Description: 03/15/2020 upon evaluation today patient presents today for initial evaluation here in our clinic concerning a wound that is on ?the left posterior lower extremity. Unfortunately this has been giving the patient some discomfort at this point which she notes has been affected ?her pretty much daily. The patient does have a history of venous insufficiency, hypertension, congestive heart failure, and a history of breast ?cancer. Fortunately there does not appear to be evidence of active infection at this time which is great news. No fevers, chills, nausea, vomiting, ?or diarrhea. Wound is not extremely large but does have some slough covering the surface of the wound. This is going require sharp debridement ?today. ?03/15/2020 upon evaluation today patient  appears to be doing fairly well in regard to her wound. She does have some slough noted on the surface ?of the wound currently but I do believe the Iodoflex has been beneficial over the past week. There is no signs of active infection at this time which ?is great news and overall very pleased with the progress. No fevers, chills, nausea, vomiting, or diarrhea. ?04/02/2020 upon evaluation today patient appears to be doing a little better in regard to her wound size wise this is not tremendously smaller she ?does have some slough buildup on the surface of the wound. With that being said this is going require some sharp debridement today to clear ?away some of the necrotic debris. Fortunately there is no evidence of active infection at this time. No fevers, chills, nausea, vomiting, or diarrhea. ?04/10/2020 on evaluation today patient appears to be doing well with regard to her ulcer which is measuring somewhat smaller today. She actually ?has more granulation tissue noted which is also good news is no need for sharp debridement today. Fortunately there is no evidence of infection ?either which is also excellent. No fevers, chills, nausea, vomiting, or diarrhea. ?04/26/2020 on evaluation today patient's wound actually is showing signs of good improvement which is great news. There does not appear to be ?any evidence of active infection. I do believe she is tolerating the collagen at this point. ?05/03/2020 upon evaluation today patient's wound actually showed signs of good granulation at this time there does not appear to be any ?evidence of active infection which is great news and overall very pleased with where things stand. With that being said I do believe that the ?patient is can require some sharp debridement today but fortunately nothing too significant. ?05/11/20 on evaluation today patient appears to be doing well in regard to her leg ulcer. She's been tolerating the dressing changes without ?complication.  Fortunately there is no signs of active infection at this time.  Overall I'm very pleased with where things stand. ?05/18/2020 upon evaluation today patient's wound is showing signs of improvement is very dry and I think the collagen for the most part just ?dried to the wound bed. I am going to clean this away with sharp debridement in order to get this under control in my opinion. ?05/25/2020 upon evaluation today patient actually appears to be doing well in regard to her leg ulcer. In fact upon inspection it appears she could ?potentially be completely healed but that is not guaranteed based on what we are seeing. There does not appear to be any signs of active ?infection which is great news. ?05/31/2020 on evaluation today patient appears to be doing well with regard to her wounds. She in fact appears to be almost completely healed on ?the left leg there is just a very small opening remaining point 1 ?06/08/2020 upon evaluation today patient appears to be doing excellent in regard to her leg ulcer on the left in fact it appears to be that she is ?completely healed as of today. I see no signs of active infection at this time which is great news. No fevers, chills, nausea, vomiting, or diarrhea. ?READMISSION ?01/16/2021 ?Patient that we have had in this clinic with a wound on the left posterior calf in the summer 2021 extending into October. She was felt to have ?chronic venous insufficiency. Per the patient's description she was discharged with what sounds like external compression stockings although she ?could not afford the "$75 per leg". She therefore did not get anything. Apparently her wound that she has currently is been in existence since ?January. She has been followed at vein and vascular with Unna boots and I think calcium alginate. She had ABIs done on 06/25/2020 that showed ?an noncompressible ABI on the right leg and the left leg but triphasic waveforms with good great toe pressures and a TBI of 0.90 on the  right and ?0.91 on the left ?Surprisingly the patient also had venous reflux studies that were really unremarkable. This included no evidence of DVTs or SVTs but there was no ?evidence of deep venous insufficiency or superficial venous insufficiency in the greater saphenous or short saphenous veins bilaterally. I would ?wonder about more central venous issues or perhaps this is all lymphedema ?01/25/2021 upon evaluation today patient appears to be doing decently well and guard to her wound. The good news is the Iodoflex that is job she ?saw Dr. Dellia Nims last week for readmission and to be honest I think that this has done extremely well over that week. I do believe that she can ?tolerate the sharp debridement today which will be good. I think that cleaning off the wound will likely allow [place] to be able to use a different type ?of dressing I do not think the Iodoflex will even be necessary based on how clean the wound looks. Fortunately there is no sign of active infection ?at this time which is great news. No fevers, chills, nausea, vomiting, or diarrhea. ?02/12/2021 upon evaluation today patient appears to be doing well with regard to her leg ulcer. We did switch to alginate when she came for nurse ?visit I think is doing much better for her. Fortunately there does not appear to be any signs of active infection at this time which is great news. No ?fevers, chills, nausea, vomiting, or diarrhea. ?[place], [place] ([id number]) ?02/18/2021 upon evaluation today patient's wound is actually showing signs of good improvement. I am very pleased with where things stand ?  currently. I do not see any signs of active infection which is great news and overall I feel like she is making good progress. The patient likewise is ?happy that things are doing so well and that this is measuring somewhat smaller. ?02/25/2021 upon evaluation today patient actually appears to be doing quite well in regard to her wounds. She has been tolerating the  dressing ?changes without complication. Fortunately there does not appear to be any signs of active infection which is great news and overall I am ?extremely pleased with where things stand. No fevers, chill

## 2021-12-09 ENCOUNTER — Inpatient Hospital Stay (HOSPITAL_BASED_OUTPATIENT_CLINIC_OR_DEPARTMENT_OTHER): Payer: Medicare Other | Admitting: Nurse Practitioner

## 2021-12-09 ENCOUNTER — Inpatient Hospital Stay: Payer: Medicare Other | Attending: Hospice and Palliative Medicine

## 2021-12-09 ENCOUNTER — Encounter: Payer: Self-pay | Admitting: Licensed Clinical Social Worker

## 2021-12-09 VITALS — BP 149/66 | HR 61 | Temp 97.8°F | Resp 16 | Ht 65.0 in | Wt 256.0 lb

## 2021-12-09 DIAGNOSIS — Z1231 Encounter for screening mammogram for malignant neoplasm of breast: Secondary | ICD-10-CM

## 2021-12-09 DIAGNOSIS — C50911 Malignant neoplasm of unspecified site of right female breast: Secondary | ICD-10-CM | POA: Diagnosis not present

## 2021-12-09 DIAGNOSIS — M8580 Other specified disorders of bone density and structure, unspecified site: Secondary | ICD-10-CM | POA: Insufficient documentation

## 2021-12-09 DIAGNOSIS — Z853 Personal history of malignant neoplasm of breast: Secondary | ICD-10-CM

## 2021-12-09 DIAGNOSIS — Z170 Estrogen receptor positive status [ER+]: Secondary | ICD-10-CM | POA: Diagnosis not present

## 2021-12-09 DIAGNOSIS — Z79811 Long term (current) use of aromatase inhibitors: Secondary | ICD-10-CM

## 2021-12-09 DIAGNOSIS — Z08 Encounter for follow-up examination after completed treatment for malignant neoplasm: Secondary | ICD-10-CM

## 2021-12-09 DIAGNOSIS — C50411 Malignant neoplasm of upper-outer quadrant of right female breast: Secondary | ICD-10-CM | POA: Diagnosis present

## 2021-12-09 DIAGNOSIS — Z803 Family history of malignant neoplasm of breast: Secondary | ICD-10-CM | POA: Diagnosis not present

## 2021-12-09 DIAGNOSIS — Z79899 Other long term (current) drug therapy: Secondary | ICD-10-CM | POA: Diagnosis not present

## 2021-12-09 NOTE — Progress Notes (Signed)
[facility] ?Clinical Social Work ? ?Clinical Social Work was referred by medical provider for assessment of psychosocial needs.  Clinical Social Worker contacted patient by phone  to offer support and assess for needs.  CSW contacted patient but was unable to leave voicemail because patient's voicemail is full. ? ? ?First Attempt ? ?[name] Montano, LCSW  ?Clinical Social Worker ?[place] ?      ? ?

## 2021-12-09 NOTE — Progress Notes (Signed)
? ?Hematology/Oncology Consult Note ?[facility]  ?[phone] [phone] ? ?Patient Care Team: ?Donnie Coffin, MD as PCP - General (Family Medicine) ?Minna Merritts, MD as Consulting Physician (Cardiology)  ? ?Name of the patient: [name]  ?[id number]  ?Jan 28, 1946  ? ?Date of visit: 12/09/21 ? ?Diagnosis- stage I right breast cancer ? ?Chief complaint/ Reason for visit-routine follow-up of breast cancer ? ?Heme/Onc History:  ?patient is a 76 year old female with a prior history of left breast cancer in 2006.  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  She has been in remission since then.  She has been getting periodic mammograms and the most recent mammogram and ultrasound in August 2019 showed 0.9 x 0.9 x 0.8 cm irregular spiculated mass in the 11 o'clock position of the right breast.  There was a concern for an abnormal intramammary lymph node as well.  No other abnormal axillary adenopathy noted.  Patient underwent ultrasound-guided biopsy of both the right breast mass as well as the axillary lymph node lymph node biopsy was negative for malignancy.  Biopsy of the right breast mass showed 6 mm, grade 1 ER PR positive and HER-2/neu negative tumor. ?  ?Patient is morbidly obese and has difficulty with ambulation.  She uses a cane or a walker to ambulate around the house.  She sees Dr. Rockey Situ for her cardiology issues.  Reports that her appetite is good and she denies any unintentional weight loss or any new aches or pains anywhere.  Family history significant for breast cancer in her sister who died from breast cancer in her 7s.  genetic testing was negative ?  ?Final pathology showed 7 mm grade 2 invasive mammary carcinoma ER PR positive her 2 negative. Negative nodes. Clear margins ?  ?Oncotype score came as low risk 8 and she does not require adjuvant chemotherapy ?She completed adjuvant radiation and  started Arimidex in December 2019 ?  ?Interval history- Patient is 76 year old female with above history of breast cancer, currently on arimidex, who returns to clinic for continue surveillance of breast cancer and follow up. She continues to wear una boots. Denies breast pain, skin changes, weight loss. Expresses some concerns about her living situation and requests to speak to social work.  ? ?ECOG PS- 2 ?Pain scale- 9 (leg wounds) ? ?Review of systems- Review of Systems  ?Constitutional:  Positive for malaise/fatigue. Negative for chills, fever and weight loss.  ?HENT:  Negative for congestion, ear discharge and nosebleeds.   ?Eyes:  Negative for blurred vision.  ?Respiratory:  Negative for cough, hemoptysis, sputum production, shortness of breath and wheezing.   ?Cardiovascular:  Positive for leg swelling. Negative for chest pain, palpitations, orthopnea and claudication.  ?Gastrointestinal:  Negative for abdominal pain, blood in stool, constipation, diarrhea, heartburn, melena, nausea and vomiting.  ?Genitourinary:  Negative for dysuria, flank pain, frequency, hematuria and urgency.  ?Musculoskeletal:  Negative for back pain, joint pain and myalgias.  ?Skin:  Negative for rash.  ?     Unna boots bilaterally  ?Neurological:  Negative for dizziness, tingling, focal weakness, seizures, weakness and headaches.  ?Endo/Heme/Allergies:  Does not bruise/bleed easily.  ?Psychiatric/Behavioral:  Negative for depression and suicidal ideas. The patient does not have insomnia.    ? ? ?No Known Allergies ? ? ?Past Medical History:  ?Diagnosis Date  ? Abnormality of gait   ? Breast cancer ([place]) 02/05/2005  ? LEFT 3.5 cm invasive  mammary carcinoma, T2, N0,; triple negative, whole breast radiation, cytoxan, taxotere chemotherapy.   ? Breast cancer ([place]) 2019  ? right breast  ? CHF (congestive heart failure) ([place])   ? March 28, 2017 echo: EF 50-55%; 1) echo 07/2011: EF 20-25%, mild concentric hypertrophy, diffuse HK, regional WMA  cannot be excluded, grade 1 DD, mitral valve with mild regurg, LA mildly dilated). 2) EF 40-45%, mild AS,                    ? Complication of anesthesia 2006  ? pt states "hard to wake up" after L breast surgery   ? Edema   ? GERD (gastroesophageal reflux disease)   ? Headache   ? Heart murmur   ? Hypertension   ? Hypokinesis   ? global, EF 35-45 % from Echo.  ? Malignant neoplasm of breast (female), unspecified site 06/14/2018  ? RIGHT T1b, N0; ER/PR positive, HER-2 negative.  ? Neuropathy   ? Obesity   ? Pain in joint, site unspecified   ? Personal history of chemotherapy 2006  ? left breast  ? Personal history of radiation therapy 2019  ? right breast  ? Personal history of radiation therapy 2006  ? left breast  ? Pneumonia   ? Sleep apnea   ? Tinea pedis   ? 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 ? ? ?Past Surgical History:  ?Procedure Laterality Date  ? BREAST BIOPSY Right 2019  ? 11 oc-INVASIVE MAMMARY CARCINOMA, NO SPECIAL TYPE  ? BREAST BIOPSY Right 2019  ? 1030 oc- neg  ? BREAST LUMPECTOMY  2006  ? left breast   ? CATARACT EXTRACTION    ? left eye  ? HERNIA REPAIR  05/25/2006  ? Incarcerated omentum in ventral hernia, Ventralight mesh, combined lap/ open with omental resection.   ? PARTIAL MASTECTOMY WITH NEEDLE LOCALIZATION Right 06/14/2018  ? Procedure: PARTIAL MASTECTOMY WITH NEEDLE LOCALIZATION;  Surgeon: Robert Bellow, MD;  Location: [facility] ORS;  Service: General;  Laterality: Right;  ? PORT-A-CATH REMOVAL  05/25/2006  ? SENTINEL NODE BIOPSY Right 06/14/2018  ? Procedure: SENTINEL NODE BIOPSY;  Surgeon: Robert Bellow, MD;  Location: [facility] ORS;  Service: General;  Laterality: Right;  ? TUBAL LIGATION    ? ? ?Social History  ? ?Socioeconomic History  ? Marital status: Divorced  ?  Spouse name: Not on file  ? Number of children: Not on file  ? Years of education: Not on file  ? Highest education level: Not on file  ?Occupational History  ? Occupation: retired  ?Tobacco Use  ? Smoking status:  Former  ?  Packs/day: 0.50  ?  Years: 30.00  ?  Pack years: 15.00  ?  Types: Cigarettes  ?  Quit date: 07/14/2000  ?  Years since quitting: 21.4  ? Smokeless tobacco: Never  ?Vaping Use  ? Vaping Use: Never used  ?Substance and Sexual Activity  ? Alcohol use: No  ? Drug use: Not Currently  ?  Types: Marijuana  ?  Comment: endorses prior marijuana use 1999  ? Sexual activity: Never  ?Other Topics Concern  ? Not on file  ?Social History Narrative  ? Not on file  ? ?Social Determinants of Health  ? ?Financial Resource Strain: Not on file  ?Food Insecurity: Not on file  ?Transportation Needs: Not on file  ?Physical Activity: Not on file  ?Stress: Not on file  ?Social Connections: Not on file  ?Intimate Partner  Violence: Not on file  ? ? ?Family History  ?Problem Relation Age of Onset  ? Hypertension Mother   ?     deceased 78  ? Cataracts Mother   ? Breast cancer Other 48  ?     maternal half-sister; deceased 30  ? Coronary artery disease Other   ? ? ?Current Outpatient Medications:  ?  anastrozole (ARIMIDEX) 1 MG tablet, TAKE 1 TABLET BY MOUTH DAILY. PATIENT MUST MAKE AN APPT TO SEE DR RAO, Disp: 90 tablet, Rfl: 0 ?  aspirin EC 81 MG tablet, Take 81 mg by mouth daily., Disp: , Rfl:  ?  cloNIDine (CATAPRES) 0.2 MG tablet, Take 1 tablet (0.2 mg total) by mouth 2 (two) times daily., Disp: 180 tablet, Rfl: 4 ?  clotrimazole (LOTRIMIN) 1 % cream, Apply 1 application topically 2 (two) times daily. , Disp: , Rfl: 1 ?  [employer] CALCIUM 600 & VITAMIN D3 600-20 MG-MCG TABS, TAKE 1 TABLET BY MOUTH TWICE A DAY, Disp: 120 tablet, Rfl: 1 ?  diclofenac Sodium (VOLTAREN) 1 % GEL, Apply 1 application topically as needed., Disp: , Rfl:  ?  Incontinence Supply Disposable (BLADDER CONTROL PADS EX ABSORB) MISC, Apply 1 Dose topically as needed. , Disp: , Rfl:  ?  loratadine (CLARITIN) 10 MG tablet, Take 10 mg by mouth daily., Disp: , Rfl:  ?  losartan (COZAAR) 25 MG tablet, Take 1 tablet (25 mg total) by mouth daily., Disp: 90 tablet, Rfl: 3 ?   methocarbamol (ROBAXIN) 500 MG tablet, Take 500 mg by mouth 2 (two) times daily as needed., Disp: , Rfl:  ?  MIRALAX 17 GM/SCOOP powder, SMARTSIG:2 Scoopful By Mouth Daily PRN, Disp: , Rfl:  ?  omeprazo

## 2021-12-10 ENCOUNTER — Encounter: Payer: Medicare Other | Attending: Physician Assistant | Admitting: Physician Assistant

## 2021-12-10 DIAGNOSIS — I509 Heart failure, unspecified: Secondary | ICD-10-CM | POA: Diagnosis not present

## 2021-12-10 DIAGNOSIS — I890 Lymphedema, not elsewhere classified: Secondary | ICD-10-CM | POA: Insufficient documentation

## 2021-12-10 DIAGNOSIS — L97822 Non-pressure chronic ulcer of other part of left lower leg with fat layer exposed: Secondary | ICD-10-CM | POA: Diagnosis not present

## 2021-12-10 DIAGNOSIS — I87333 Chronic venous hypertension (idiopathic) with ulcer and inflammation of bilateral lower extremity: Secondary | ICD-10-CM | POA: Diagnosis not present

## 2021-12-10 DIAGNOSIS — I110 Hypertensive heart disease with heart failure: Secondary | ICD-10-CM | POA: Diagnosis not present

## 2021-12-10 DIAGNOSIS — L97812 Non-pressure chronic ulcer of other part of right lower leg with fat layer exposed: Secondary | ICD-10-CM | POA: Diagnosis not present

## 2021-12-10 NOTE — Progress Notes (Addendum)
[place], [place] ([id number]) ?Visit Report for 12/10/2021 ?Chief Complaint Document Details ?Patient Name: [name], [name] ?Date of Service: 12/10/2021 2:00 PM ?Medical Record Number: [id number] ?Patient Account Number: [id number] ?Date of Birth/Sex: 1945/10/06 (76 y.o. F) ?Treating RN: Carlene Coria ?Primary Care Provider: [name] Other Clinician: ?Referring Provider: [name] ?Treating Provider/Extender: [name] ?Weeks in Treatment: 46 ?Information Obtained from: Patient ?Chief Complaint ?Bilateral LE Ulcers ?Electronic Signature(s) ?Signed: 12/10/2021 2:08:19 PM By: Worthy Keeler PA-C ?Entered By: Worthy Keeler on 12/10/2021 14:08:19 ?[name], [name] ([id number]) ?-------------------------------------------------------------------------------- ?HPI Details ?Patient Name: [name], [name] ?Date of Service: 12/10/2021 2:00 PM ?Medical Record Number: [id number] ?Patient Account Number: [id number] ?Date of Birth/Sex: 07/10/1946 (76 y.o. F) ?Treating RN: Carlene Coria ?Primary Care Provider: [name] Other Clinician: ?Referring Provider: [name] ?Treating Provider/Extender: [name] ?Weeks in Treatment: 46 ?History of Present Illness ?HPI Description: 03/15/2020 upon evaluation today patient presents today for initial evaluation here in our clinic concerning a wound that is on ?the left posterior lower extremity. Unfortunately this has been giving the patient some discomfort at this point which she notes has been affected ?her pretty much daily. The patient does have a history of venous insufficiency, hypertension, congestive heart failure, and a history of breast ?cancer. Fortunately there does not appear to be evidence of active infection at this time which is great news. No fevers, chills, nausea, vomiting, ?or diarrhea. Wound is not extremely large but does have some slough covering the surface of the wound. This is going require sharp debridement ?today. ?03/15/2020 upon evaluation today patient  appears to be doing fairly well in regard to her wound. She does have some slough noted on the surface ?of the wound currently but I do believe the Iodoflex has been beneficial over the past week. There is no signs of active infection at this time which ?is great news and overall very pleased with the progress. No fevers, chills, nausea, vomiting, or diarrhea. ?04/02/2020 upon evaluation today patient appears to be doing a little better in regard to her wound size wise this is not tremendously smaller she ?does have some slough buildup on the surface of the wound. With that being said this is going require some sharp debridement today to clear ?away some of the necrotic debris. Fortunately there is no evidence of active infection at this time. No fevers, chills, nausea, vomiting, or diarrhea. ?04/10/2020 on evaluation today patient appears to be doing well with regard to her ulcer which is measuring somewhat smaller today. She actually ?has more granulation tissue noted which is also good news is no need for sharp debridement today. Fortunately there is no evidence of infection ?either which is also excellent. No fevers, chills, nausea, vomiting, or diarrhea. ?04/26/2020 on evaluation today patient's wound actually is showing signs of good improvement which is great news. There does not appear to be ?any evidence of active infection. I do believe she is tolerating the collagen at this point. ?05/03/2020 upon evaluation today patient's wound actually showed signs of good granulation at this time there does not appear to be any ?evidence of active infection which is great news and overall very pleased with where things stand. With that being said I do believe that the ?patient is can require some sharp debridement today but fortunately nothing too significant. ?05/11/20 on evaluation today patient appears to be doing well in regard to her leg ulcer. She's been tolerating the dressing changes without ?complication.  Fortunately there is no signs of active infection at this time.  Overall I'm very pleased with where things stand. ?05/18/2020 upon evaluation today patient's wound is showing signs of improvement is very dry and I think the collagen for the most part just ?dried to the wound bed. I am going to clean this away with sharp debridement in order to get this under control in my opinion. ?05/25/2020 upon evaluation today patient actually appears to be doing well in regard to her leg ulcer. In fact upon inspection it appears she could ?potentially be completely healed but that is not guaranteed based on what we are seeing. There does not appear to be any signs of active ?infection which is great news. ?05/31/2020 on evaluation today patient appears to be doing well with regard to her wounds. She in fact appears to be almost completely healed on ?the left leg there is just a very small opening remaining point 1 ?06/08/2020 upon evaluation today patient appears to be doing excellent in regard to her leg ulcer on the left in fact it appears to be that she is ?completely healed as of today. I see no signs of active infection at this time which is great news. No fevers, chills, nausea, vomiting, or diarrhea. ?READMISSION ?01/16/2021 ?Patient that we have had in this clinic with a wound on the left posterior calf in the summer 2021 extending into October. She was felt to have ?chronic venous insufficiency. Per the patient's description she was discharged with what sounds like external compression stockings although she ?could not afford the "$75 per leg". She therefore did not get anything. Apparently her wound that she has currently is been in existence since ?January. She has been followed at vein and vascular with Unna boots and I think calcium alginate. She had ABIs done on 06/25/2020 that showed ?an noncompressible ABI on the right leg and the left leg but triphasic waveforms with good great toe pressures and a TBI of 0.90 on the  right and ?0.91 on the left ?Surprisingly the patient also had venous reflux studies that were really unremarkable. This included no evidence of DVTs or SVTs but there was no ?evidence of deep venous insufficiency or superficial venous insufficiency in the greater saphenous or short saphenous veins bilaterally. I would ?wonder about more central venous issues or perhaps this is all lymphedema ?01/25/2021 upon evaluation today patient appears to be doing decently well and guard to her wound. The good news is the Iodoflex that is job she ?saw Dr. Dellia Nims last week for readmission and to be honest I think that this has done extremely well over that week. I do believe that she can ?tolerate the sharp debridement today which will be good. I think that cleaning off the wound will likely allow [place] to be able to use a different type ?of dressing I do not think the Iodoflex will even be necessary based on how clean the wound looks. Fortunately there is no sign of active infection ?at this time which is great news. No fevers, chills, nausea, vomiting, or diarrhea. ?02/12/2021 upon evaluation today patient appears to be doing well with regard to her leg ulcer. We did switch to alginate when she came for nurse ?visit I think is doing much better for her. Fortunately there does not appear to be any signs of active infection at this time which is great news. No ?fevers, chills, nausea, vomiting, or diarrhea. ?[place], [place] ([id number]) ?02/18/2021 upon evaluation today patient's wound is actually showing signs of good improvement. I am very pleased with where things stand ?  currently. I do not see any signs of active infection which is great news and overall I feel like she is making good progress. The patient likewise is ?happy that things are doing so well and that this is measuring somewhat smaller. ?02/25/2021 upon evaluation today patient actually appears to be doing quite well in regard to her wounds. She has been tolerating the  dressing ?changes without complication. Fortunately there does not appear to be any signs of active infection which is great news and overall I am ?extremely pleased with where things stand. No fevers, chills, n

## 2021-12-12 NOTE — Progress Notes (Signed)
[place], [place] ([id number]) ?Visit Report for 12/10/2021 ?Arrival Information Details ?Patient Name: [name], [name] ?Date of Service: 12/10/2021 2:00 PM ?Medical Record Number: [id number] ?Patient Account Number: [id number] ?Date of Birth/Sex: 1946-01-02 (76 y.o. F) ?Treating RN: Carlene Coria ?Primary Care Reegan Bouffard: [name] Other Clinician: ?Referring Gordie Belvin: [name] ?Treating Aleana Fifita/Extender: [name] ?Weeks in Treatment: 46 ?Visit Information History Since Last Visit ?All ordered tests and consults were completed: No ?Patient Arrived: Wheel Chair ?Added or deleted any medications: No ?Arrival Time: 13:50 ?Any new allergies or adverse reactions: No ?Accompanied By: self ?Had a fall or experienced change in No ?Transfer Assistance: None ?activities of daily living that may affect ?Patient Identification Verified: Yes ?risk of falls: ?Secondary Verification Process Completed: Yes ?Signs or symptoms of abuse/neglect since last visito No ?Patient Requires Transmission-Based Precautions: No ?Hospitalized since last visit: No ?Patient Has Alerts: Yes ?Implantable device outside of the clinic excluding No ?Patient Alerts: NOT DIABETIC ?cellular tissue based products placed in the center ?since last visit: ?Has Dressing in Place as Prescribed: Yes ?Has Compression in Place as Prescribed: Yes ?Pain Present Now: No ?Electronic Signature(s) ?Signed: 12/12/2021 9:23:23 AM By: Carlene Coria RN ?Entered By: Carlene Coria on 12/10/2021 13:56:30 ?[name] ([id number]) ?-------------------------------------------------------------------------------- ?Clinic Level of Care Assessment Details ?Patient Name: [name], [name] ?Date of Service: 12/10/2021 2:00 PM ?Medical Record Number: [id number] ?Patient Account Number: [id number] ?Date of Birth/Sex: 1946/06/11 (76 y.o. F) ?Treating RN: Carlene Coria ?Primary Care [name] Courtwright: [name] Other Clinician: ?Referring Minaal Struckman: [name] ?Treating Vickki Igou/Extender: [name] ?Weeks in Treatment: 46 ?Clinic Level of Care Assessment Items ?TOOL 1 Quantity Score ?'[]'$  - Use when EandM and Procedure is performed on INITIAL visit 0 ?ASSESSMENTS - Nursing Assessment / Reassessment ?'[]'$  - General Physical Exam (combine w/ comprehensive assessment (listed just below) when performed on new ?0 ?pt. evals) ?'[]'$  - 0 ?Comprehensive Assessment (HX, ROS, Risk Assessments, Wounds Hx, etc.) ?ASSESSMENTS - Wound and Skin Assessment / Reassessment ?'[]'$  - Dermatologic / Skin Assessment (not related to wound area) 0 ?ASSESSMENTS - Ostomy and/or Continence Assessment and Care ?'[]'$  - Incontinence Assessment and Management 0 ?'[]'$  - 0 ?Ostomy Care Assessment and Management (repouching, etc.) ?PROCESS - Coordination of Care ?'[]'$  - Simple Patient / Family Education for ongoing care 0 ?'[]'$  - 0 ?Complex (extensive) Patient / Family Education for ongoing care ?'[]'$  - 0 ?Staff obtains Consents, Records, Test Results / Process Orders ?'[]'$  - 0 ?Staff telephones HHA, Nursing Homes / Clarify orders / etc ?'[]'$  - 0 ?Routine Transfer to another Facility (non-emergent condition) ?'[]'$  - 0 ?Routine Hospital Admission (non-emergent condition) ?'[]'$  - 0 ?New Admissions / Biomedical engineer / Ordering NPWT, Apligraf, etc. ?'[]'$  - 0 ?Emergency Hospital Admission (emergent condition) ?PROCESS - Special Needs ?'[]'$  - Pediatric / Minor Patient Management 0 ?'[]'$  - 0 ?Isolation Patient Management ?'[]'$  - 0 ?Hearing / Language / Visual special needs ?'[]'$  - 0 ?Assessment of Community assistance (transportation, D/C planning, etc.) ?'[]'$  - 0 ?Additional assistance / Altered mentation ?'[]'$  - 0 ?Support Surface(s) Assessment (bed, cushion, seat, etc.) ?INTERVENTIONS - Miscellaneous ?'[]'$  - External ear exam 0 ?'[]'$  - 0 ?Patient Transfer (multiple staff / Civil Service fast streamer / Similar devices) ?'[]'$  - 0 ?Simple Staple / Suture removal (25 or less) ?'[]'$  - 0 ?Complex Staple / Suture removal (26 or more) ?'[]'$  - 0 ?Hypo/Hyperglycemic Management (do not check if billed  separately) ?'[]'$  - 0 ?Ankle / Brachial Index (ABI) - do not check if billed separately ?Has the patient been seen at the hospital within the  last three years: Yes ?Total Score: 0 ?Level Of Care: ____ ?[place], [place] ([id number]) ?Electronic Signature(s) ?Signed: 12/12/2021 9:23:23 AM By: Carlene Coria RN ?Entered By: Carlene Coria on 12/10/2021 14:23:31 ?[place], [place] ([id number]) ?-------------------------------------------------------------------------------- ?Compression Therapy Details ?Patient Name: [name], [name] ?Date of Service: 12/10/2021 2:00 PM ?Medical Record Number: [id number] ?Patient Account Number: [id number] ?Date of Birth/Sex: 1945/12/19 (76 y.o. F) ?Treating RN: Carlene Coria ?Primary Care Bonnie Roig: [name] Other Clinician: ?Referring Quanta Roher: [name] ?Treating Deloma Spindle/Extender: [name] ?Weeks in Treatment: 46 ?Compression Therapy Performed for Wound Assessment: Wound #3 Left Lower Leg ?Performed By: Clinician Carlene Coria, RN ?Compression Type: Three Layer ?Post Procedure Diagnosis ?Same as Pre-procedure ?Electronic Signature(s) ?Signed: 12/12/2021 9:23:23 AM By: Carlene Coria RN ?Entered By: Carlene Coria on 12/10/2021 14:21:09 ?[place], [place] ([id number]) ?-------------------------------------------------------------------------------- ?Compression Therapy Details ?Patient Name: [name], [name] ?Date of Service: 12/10/2021 2:00 PM ?Medical Record Number: [id number] ?Patient Account Number: [id number] ?Date of Birth/Sex: 05/01/1946 (76 y.o. F) ?Treating RN: Carlene Coria ?Primary Care Sequoia Mincey: [name] Other Clinician: ?Referring Rondy Krupinski: [name] ?Treating Zaylen Susman/Extender: [name] ?Weeks in Treatment: 46 ?Compression Therapy Performed for Wound Assessment: Wound #4 Right,Lateral Lower Leg ?Performed By: Clinician Carlene Coria, RN ?Compression Type: Three Layer ?Post Procedure Diagnosis ?Same as Pre-procedure ?Electronic Signature(s) ?Signed: 12/12/2021 9:23:23 AM By: Carlene Coria RN ?Entered By: Carlene Coria on 12/10/2021 14:22:02 ?[place], [place] ([id number]) ?-------------------------------------------------------------------------------- ?Lower Extremity Assessment Details ?Patient Name: [name], [name] ?Date of Service: 12/10/2021 2:00 PM ?Medical Record Number: [id number] ?Patient Account Number: [id number] ?Date of Birth/Sex: May 17, 1946 (76 y.o. F) ?Treating RN: Carlene Coria ?Primary Care Carlosdaniel Grob: [name] Other Clinician: ?Referring Ellias Mcelreath: [name] ?Treating Alonni Heimsoth/Extender: [name] ?Weeks in Treatment: 46 ?Edema Assessment ?Assessed: [Left: No] [Right: No] ?Edema: [Left: Yes] [Right: Yes] ?Calf ?Left: Right: ?Point of Measurement: 31 cm From Medial Instep 36 cm 39 cm ?Ankle ?Left: Right: ?Point of Measurement: 10 cm From Medial Instep 25 cm 25 cm ?Vascular Assessment ?Pulses: ?Dorsalis Pedis ?Palpable: [Left:Yes] [Right:Yes] ?Electronic Signature(s) ?Signed: 12/12/2021 9:23:23 AM By: Carlene Coria RN ?Entered By: Carlene Coria on 12/10/2021 14:08:45 ?[place], [place] ([id number]) ?-------------------------------------------------------------------------------- ?Multi Wound Chart Details ?Patient Name: [name], [name] ?Date of Service: 12/10/2021 2:00 PM ?Medical Record Number: [id number] ?Patient Account Number: [id number] ?Date of Birth/Sex: 12/29/1945 (76 y.o. F) ?Treating RN: Carlene Coria ?Primary Care Tirth Cothron: [name] Other Clinician: ?Referring Kymari Lollis: [name] ?Treating Dairon Procter/Extender: [name] ?Weeks in Treatment: 46 ?Vital Signs ?Height(in): 69 ?Pulse(bpm): 72 ?Weight(lbs): 244 ?Blood Pressure(mmHg): 158/79 ?Body Mass Index(BMI): 36 ?Temperature(??F): 98.2 ?Respiratory Rate(breaths/min): 18 ?Photos: [N/A:N/A] ?Wound Location: Left Lower Leg Right, Lateral Lower Leg N/A ?Wounding Event: Gradually Appeared Gradually Appeared N/A ?Primary Etiology: Lymphedema Venous Leg Ulcer N/A ?Comorbid History: Cataracts, Lymphedema, Sleep Cataracts,  Lymphedema, Sleep N/A ?Apnea, Congestive Heart Failure, Apnea, Congestive Heart Failure, ?Hypertension, Osteoarthritis, Hypertension, Osteoarthritis, ?Neuropathy, Received Neuropathy, Received ?Chemotherapy, Receive

## 2021-12-16 ENCOUNTER — Encounter: Payer: Self-pay | Admitting: Licensed Clinical Social Worker

## 2021-12-16 NOTE — Progress Notes (Signed)
[facility] CSW Progress Note ?  ? ?Clinical Social Work was referred by nurse navigator for assessment of psychosocial needs.  Clinical Social Worker contacted patient by phone  to offer support and assess for needs.  CSW left voicemail with contact information and request for return call. ? ?Second Attempt ? ?Jaiyanna Safran, LCSW  ?Clinical Social Worker ?[facility] ?      ?  ?

## 2021-12-17 ENCOUNTER — Encounter: Payer: Medicare Other | Admitting: Physician Assistant

## 2021-12-17 DIAGNOSIS — I890 Lymphedema, not elsewhere classified: Secondary | ICD-10-CM | POA: Diagnosis not present

## 2021-12-17 NOTE — Progress Notes (Addendum)
[name], [name] ([id number]) ?Visit Report for 12/17/2021 ?Chief Complaint Document Details ?Patient Name: [name], [name] ?Date of Service: 12/17/2021 2:45 PM ?Medical Record Number: [id number] ?Patient Account Number: [id number] ?Date of Birth/Sex: 06/09/1946 (76 y.o. F) ?Treating RN: Carlene Coria ?Primary Care Provider: [name] Other Clinician: ?Referring Provider: [name] ?Treating Provider/Extender: [name] ?Weeks in Treatment: 47 ?Information Obtained from: Patient ?Chief Complaint ?Bilateral LE Ulcers ?Electronic Signature(s) ?Signed: 12/17/2021 2:55:08 PM By: Worthy Keeler PA-C ?Entered By: Worthy Keeler on 12/17/2021 14:55:08 ?MICAILA, ZIEMBA ([id number]) ?-------------------------------------------------------------------------------- ?HPI Details ?Patient Name: [name], [name] ?Date of Service: 12/17/2021 2:45 PM ?Medical Record Number: [id number] ?Patient Account Number: [id number] ?Date of Birth/Sex: 01/31/1946 (76 y.o. F) ?Treating RN: Carlene Coria ?Primary Care Provider: [name] Other Clinician: ?Referring Provider: [name] ?Treating Provider/Extender: [name] ?Weeks in Treatment: 47 ?History of Present Illness ?HPI Description: 03/15/2020 upon evaluation today patient presents today for initial evaluation here in our clinic concerning a wound that is on ?the left posterior lower extremity. Unfortunately this has been giving the patient some discomfort at this point which she notes has been affected ?her pretty much daily. The patient does have a history of venous insufficiency, hypertension, congestive heart failure, and a history of breast ?cancer. Fortunately there does not appear to be evidence of active infection at this time which is great news. No fevers, chills, nausea, vomiting, ?or diarrhea. Wound is not extremely large but does have some slough covering the surface of the wound. This is going require sharp debridement ?today. ?03/15/2020 upon evaluation today patient  appears to be doing fairly well in regard to her wound. She does have some slough noted on the surface ?of the wound currently but I do believe the Iodoflex has been beneficial over the past week. There is no signs of active infection at this time which ?is great news and overall very pleased with the progress. No fevers, chills, nausea, vomiting, or diarrhea. ?04/02/2020 upon evaluation today patient appears to be doing a little better in regard to her wound size wise this is not tremendously smaller she ?does have some slough buildup on the surface of the wound. With that being said this is going require some sharp debridement today to clear ?away some of the necrotic debris. Fortunately there is no evidence of active infection at this time. No fevers, chills, nausea, vomiting, or diarrhea. ?04/10/2020 on evaluation today patient appears to be doing well with regard to her ulcer which is measuring somewhat smaller today. She actually ?has more granulation tissue noted which is also good news is no need for sharp debridement today. Fortunately there is no evidence of infection ?either which is also excellent. No fevers, chills, nausea, vomiting, or diarrhea. ?04/26/2020 on evaluation today patient's wound actually is showing signs of good improvement which is great news. There does not appear to be ?any evidence of active infection. I do believe she is tolerating the collagen at this point. ?05/03/2020 upon evaluation today patient's wound actually showed signs of good granulation at this time there does not appear to be any ?evidence of active infection which is great news and overall very pleased with where things stand. With that being said I do believe that the ?patient is can require some sharp debridement today but fortunately nothing too significant. ?05/11/20 on evaluation today patient appears to be doing well in regard to her leg ulcer. She's been tolerating the dressing changes without ?complication.  Fortunately there is no signs of active infection at this time.  Overall I'm very pleased with where things stand. ?05/18/2020 upon evaluation today patient's wound is showing signs of improvement is very dry and I think the collagen for the most part just ?dried to the wound bed. I am going to clean this away with sharp debridement in order to get this under control in my opinion. ?05/25/2020 upon evaluation today patient actually appears to be doing well in regard to her leg ulcer. In fact upon inspection it appears she could ?potentially be completely healed but that is not guaranteed based on what we are seeing. There does not appear to be any signs of active ?infection which is great news. ?05/31/2020 on evaluation today patient appears to be doing well with regard to her wounds. She in fact appears to be almost completely healed on ?the left leg there is just a very small opening remaining point 1 ?06/08/2020 upon evaluation today patient appears to be doing excellent in regard to her leg ulcer on the left in fact it appears to be that she is ?completely healed as of today. I see no signs of active infection at this time which is great news. No fevers, chills, nausea, vomiting, or diarrhea. ?READMISSION ?01/16/2021 ?Patient that we have had in this clinic with a wound on the left posterior calf in the summer 2021 extending into October. She was felt to have ?chronic venous insufficiency. Per the patient's description she was discharged with what sounds like external compression stockings although she ?could not afford the "$75 per leg". She therefore did not get anything. Apparently her wound that she has currently is been in existence since ?January. She has been followed at vein and vascular with Unna boots and I think calcium alginate. She had ABIs done on 06/25/2020 that showed ?an noncompressible ABI on the right leg and the left leg but triphasic waveforms with good great toe pressures and a TBI of 0.90 on the  right and ?0.91 on the left ?Surprisingly the patient also had venous reflux studies that were really unremarkable. This included no evidence of DVTs or SVTs but there was no ?evidence of deep venous insufficiency or superficial venous insufficiency in the greater saphenous or short saphenous veins bilaterally. I would ?wonder about more central venous issues or perhaps this is all lymphedema ?01/25/2021 upon evaluation today patient appears to be doing decently well and guard to her wound. The good news is the Iodoflex that is job she ?saw Dr. Dellia Nims last week for readmission and to be honest I think that this has done extremely well over that week. I do believe that she can ?tolerate the sharp debridement today which will be good. I think that cleaning off the wound will likely allow [place] to be able to use a different type ?of dressing I do not think the Iodoflex will even be necessary based on how clean the wound looks. Fortunately there is no sign of active infection ?at this time which is great news. No fevers, chills, nausea, vomiting, or diarrhea. ?02/12/2021 upon evaluation today patient appears to be doing well with regard to her leg ulcer. We did switch to alginate when she came for nurse ?visit I think is doing much better for her. Fortunately there does not appear to be any signs of active infection at this time which is great news. No ?fevers, chills, nausea, vomiting, or diarrhea. ?[place], [name] ([id number]) ?02/18/2021 upon evaluation today patient's wound is actually showing signs of good improvement. I am very pleased with where things stand ?  currently. I do not see any signs of active infection which is great news and overall I feel like she is making good progress. The patient likewise is ?happy that things are doing so well and that this is measuring somewhat smaller. ?02/25/2021 upon evaluation today patient actually appears to be doing quite well in regard to her wounds. She has been tolerating the  dressing ?changes without complication. Fortunately there does not appear to be any signs of active infection which is great news and overall I am ?extremely pleased with where things stand. No fevers, chill

## 2021-12-17 NOTE — Progress Notes (Addendum)
[place], [place] ([id number]) ?Visit Report for 12/17/2021 ?Arrival Information Details ?Patient Name: [name], [name] ?Date of Service: 12/17/2021 2:45 PM ?Medical Record Number: [id number] ?Patient Account Number: [id number] ?Date of Birth/Sex: 16-Aug-1946 (76 y.o. F) ?Treating RN: Carlene Coria ?Primary Care Alvis Pulcini: [name] Other Clinician: ?Referring Wolf Boulay: [name] ?Treating Allix Blomquist/Extender: [name] ?Weeks in Treatment: 47 ?Visit Information History Since Last Visit ?All ordered tests and consults were completed: No ?Patient Arrived: Ambulatory ?Added or deleted any medications: No ?Arrival Time: 15:00 ?Any new allergies or adverse reactions: No ?Accompanied By: self ?Had a fall or experienced change in No ?Transfer Assistance: None ?activities of daily living that may affect ?Patient Identification Verified: Yes ?risk of falls: ?Secondary Verification Process Completed: Yes ?Signs or symptoms of abuse/neglect since last visito No ?Patient Requires Transmission-Based Precautions: No ?Hospitalized since last visit: No ?Patient Has Alerts: Yes ?Implantable device outside of the clinic excluding No ?Patient Alerts: NOT DIABETIC ?cellular tissue based products placed in the center ?since last visit: ?Has Dressing in Place as Prescribed: Yes ?Pain Present Now: No ?Electronic Signature(s) ?Signed: 12/24/2021 9:16:22 AM By: Carlene Coria RN ?Entered By: Carlene Coria on 12/17/2021 15:07:31 ?[place], Jannelle ([id number]) ?-------------------------------------------------------------------------------- ?Clinic Level of Care Assessment Details ?Patient Name: [name], [name] ?Date of Service: 12/17/2021 2:45 PM ?Medical Record Number: [id number] ?Patient Account Number: [id number] ?Date of Birth/Sex: [birth year] (76 y.o. F) ?Treating RN: Carlene Coria ?Primary Care Coltan Spinello: [name] Other Clinician: ?Referring Kelsey Durflinger: [name] ?Treating Analicia Skibinski/Extender: [name] ?Weeks in Treatment: 47 ?Clinic  Level of Care Assessment Items ?TOOL 1 Quantity Score ?'[]'$  - Use when EandM and Procedure is performed on INITIAL visit 0 ?ASSESSMENTS - Nursing Assessment / Reassessment ?'[]'$  - General Physical Exam (combine w/ comprehensive assessment (listed just below) when performed on new ?0 ?pt. evals) ?'[]'$  - 0 ?Comprehensive Assessment (HX, ROS, Risk Assessments, Wounds Hx, etc.) ?ASSESSMENTS - Wound and Skin Assessment / Reassessment ?'[]'$  - Dermatologic / Skin Assessment (not related to wound area) 0 ?ASSESSMENTS - Ostomy and/or Continence Assessment and Care ?'[]'$  - Incontinence Assessment and Management 0 ?'[]'$  - 0 ?Ostomy Care Assessment and Management (repouching, etc.) ?PROCESS - Coordination of Care ?'[]'$  - Simple Patient / Family Education for ongoing care 0 ?'[]'$  - 0 ?Complex (extensive) Patient / Family Education for ongoing care ?'[]'$  - 0 ?Staff obtains Consents, Records, Test Results / Process Orders ?'[]'$  - 0 ?Staff telephones HHA, Nursing Homes / Clarify orders / etc ?'[]'$  - 0 ?Routine Transfer to another Facility (non-emergent condition) ?'[]'$  - 0 ?Routine Hospital Admission (non-emergent condition) ?'[]'$  - 0 ?New Admissions / Biomedical engineer / Ordering NPWT, Apligraf, etc. ?'[]'$  - 0 ?Emergency Hospital Admission (emergent condition) ?PROCESS - Special Needs ?'[]'$  - Pediatric / Minor Patient Management 0 ?'[]'$  - 0 ?Isolation Patient Management ?'[]'$  - 0 ?Hearing / Language / Visual special needs ?'[]'$  - 0 ?Assessment of Community assistance (transportation, D/C planning, etc.) ?'[]'$  - 0 ?Additional assistance / Altered mentation ?'[]'$  - 0 ?Support Surface(s) Assessment (bed, cushion, seat, etc.) ?INTERVENTIONS - Miscellaneous ?'[]'$  - External ear exam 0 ?'[]'$  - 0 ?Patient Transfer (multiple staff / Civil Service fast streamer / Similar devices) ?'[]'$  - 0 ?Simple Staple / Suture removal (25 or less) ?'[]'$  - 0 ?Complex Staple / Suture removal (26 or more) ?'[]'$  - 0 ?Hypo/Hyperglycemic Management (do not check if billed separately) ?'[]'$  - 0 ?Ankle / Brachial Index  (ABI) - do not check if billed separately ?Has the patient been seen at the hospital within the last three years: Yes ?Total Score: 0 ?Level  Of Care: ____ ?[place], [place] ([id number]) ?Electronic Signature(s) ?Signed: 12/24/2021 9:16:22 AM By: Carlene Coria RN ?Entered By: Carlene Coria on 12/17/2021 15:32:32 ?[place], Daryn ([id number]) ?-------------------------------------------------------------------------------- ?Compression Therapy Details ?Patient Name: [name], [name] ?Date of Service: 12/17/2021 2:45 PM ?Medical Record Number: [id number] ?Patient Account Number: [id number] ?Date of Birth/Sex: 02/02/1946 (76 y.o. F) ?Treating RN: Carlene Coria ?Primary Care Jamyra Zweig: [name] Other Clinician: ?Referring Bulmaro Feagans: [name] ?Treating Rodina Pinales/Extender: [name] ?Weeks in Treatment: 47 ?Compression Therapy Performed for Wound Assessment: Wound #3 Left Lower Leg ?Performed By: Clinician Carlene Coria, RN ?Compression Type: Three Layer ?Post Procedure Diagnosis ?Same as Pre-procedure ?Electronic Signature(s) ?Signed: 12/24/2021 9:16:22 AM By: Carlene Coria RN ?Entered By: Carlene Coria on 12/17/2021 15:32:02 ?[place], Ben[place]a ([id number]) ?-------------------------------------------------------------------------------- ?Compression Therapy Details ?Patient Name: [name], [name] ?Date of Service: 12/17/2021 2:45 PM ?Medical Record Number: [id number] ?Patient Account Number: [id number] ?Date of Birth/Sex: 1945/10/25 (76 y.o. F) ?Treating RN: Carlene Coria ?Primary Care Teodor Prater: [name] Other Clinician: ?Referring Jakeob Tullis: [name] ?Treating Malana Eberwein/Extender: [name] ?Weeks in Treatment: 47 ?Compression Therapy Performed for Wound Assessment: Wound #4 Right,Lateral Lower Leg ?Performed By: Clinician Carlene Coria, RN ?Compression Type: Three Layer ?Post Procedure Diagnosis ?Same as Pre-procedure ?Electronic Signature(s) ?Signed: 12/24/2021 9:16:22 AM By: Carlene Coria RN ?Entered By: Carlene Coria on  12/17/2021 15:32:17 ?[place], Gizelle ([id number]) ?-------------------------------------------------------------------------------- ?Encounter Discharge Information Details ?Patient Name: TIFANI, DACK ?Date of Service: 12/17/2021 2:45 PM ?Medical Record Number: [id number] ?Patient Account Number: [id number] ?Date of Birth/Sex: October 29, 1945 (76 y.o. F) ?Treating RN: Carlene Coria ?Primary Care Emerick Weatherly: [name] Other Clinician: ?Referring Domanique Luckett: [name] ?Treating Dawnette Mione/Extender: [name] ?Weeks in Treatment: 47 ?Encounter Discharge Information Items ?Discharge Condition: Stable ?Ambulatory Status: Wheelchair ?Discharge Destination: Home ?Transportation: Private Auto ?Accompanied By: self ?Schedule Follow-up Appointment: Yes ?Clinical Summary of Care: Patient Declined ?Electronic Signature(s) ?Signed: 12/24/2021 9:16:22 AM By: Carlene Coria RN ?Entered By: Carlene Coria on 12/17/2021 15:33:55 ?[place], Aubreyana ([id number]) ?-------------------------------------------------------------------------------- ?Lower Extremity Assessment Details ?Patient Name: LERIN, JECH ?Date of Service: 12/17/2021 2:45 PM ?Medical Record Number: [id number] ?Patient Account Number: [id number] ?Date of Birth/Sex: 10/23/1945 (76 y.o. F) ?Treating RN: Carlene Coria ?Primary Care Torres Hardenbrook: [name] Other Clinician: ?Referring Drenda Sobecki: [name] ?Treating Akiko Schexnider/Extender: [name] ?Weeks in Treatment: 47 ?Edema Assessment ?Assessed: [Left: No] [Right: No] ?Edema: [Left: Yes] [Right: Yes] ?Calf ?Left: Right: ?Point of Measurement: 31 cm From Medial Instep 36 cm 39 cm ?Ankle ?Left: Right: ?Point of Measurement: 10 cm From Medial Instep 25 cm 25 cm ?Vascular Assessment ?Pulses: ?Dorsalis Pedis ?Palpable: [Left:Yes] [Right:Yes] ?Electronic Signature(s) ?Signed: 12/24/2021 9:16:22 AM By: Carlene Coria RN ?Entered By: Carlene Coria on 12/17/2021 15:22:32 ?[place], [place]  ([id number]) ?-------------------------------------------------------------------------------- ?Multi Wound Chart Details ?Patient Name: TERA, PELLICANE ?Date of Service: 12/17/2021 2:45 PM ?Medical Record Number: [id number] ?Patient Account Number: [id number]

## 2021-12-22 ENCOUNTER — Other Ambulatory Visit: Payer: Self-pay | Admitting: Oncology

## 2021-12-24 ENCOUNTER — Encounter: Payer: Medicare Other | Admitting: Physician Assistant

## 2021-12-24 DIAGNOSIS — I890 Lymphedema, not elsewhere classified: Secondary | ICD-10-CM | POA: Diagnosis not present

## 2021-12-24 NOTE — Progress Notes (Addendum)
[place], [place] ([id number]) ?Visit Report for 12/24/2021 ?Chief Complaint Document Details ?Patient Name: [name], [name] ?Date of Service: 12/24/2021 2:45 PM ?Medical Record Number: [id number] ?Patient Account Number: [id number] ?Date of Birth/Sex: 01/16/1946 (76 y.o. F) ?Treating RN: Carlene Coria ?Primary Care Provider: [name] Other Clinician: ?Referring Provider: [name] ?Treating Provider/Extender: [name] ?Weeks in Treatment: 48 ?Information Obtained from: Patient ?Chief Complaint ?Bilateral LE Ulcers ?Electronic Signature(s) ?Signed: 12/24/2021 3:27:44 PM By: Worthy Keeler PA-C ?Entered By: Worthy Keeler on 12/24/2021 15:27:44 ?ELLIYAH, LISZEWSKI ([id number]) ?-------------------------------------------------------------------------------- ?HPI Details ?Patient Name: [name], [name] ?Date of Service: 12/24/2021 2:45 PM ?Medical Record Number: [id number] ?Patient Account Number: [id number] ?Date of Birth/Sex: [birth year] (76 y.o. F) ?Treating RN: Carlene Coria ?Primary Care Provider: [name] Other Clinician: ?Referring Provider: [name] ?Treating Provider/Extender: [name] ?Weeks in Treatment: 48 ?History of Present Illness ?HPI Description: 03/15/2020 upon evaluation today patient presents today for initial evaluation here in our clinic concerning a wound that is on ?the left posterior lower extremity. Unfortunately this has been giving the patient some discomfort at this point which she notes has been affected ?her pretty much daily. The patient does have a history of venous insufficiency, hypertension, congestive heart failure, and a history of breast ?cancer. Fortunately there does not appear to be evidence of active infection at this time which is great news. No fevers, chills, nausea, vomiting, ?or diarrhea. Wound is not extremely large but does have some slough covering the surface of the wound. This is going require sharp debridement ?today. ?03/15/2020 upon evaluation today patient  appears to be doing fairly well in regard to her wound. She does have some slough noted on the surface ?of the wound currently but I do believe the Iodoflex has been beneficial over the past week. There is no signs of active infection at this time which ?is great news and overall very pleased with the progress. No fevers, chills, nausea, vomiting, or diarrhea. ?04/02/2020 upon evaluation today patient appears to be doing a little better in regard to her wound size wise this is not tremendously smaller she ?does have some slough buildup on the surface of the wound. With that being said this is going require some sharp debridement today to clear ?away some of the necrotic debris. Fortunately there is no evidence of active infection at this time. No fevers, chills, nausea, vomiting, or diarrhea. ?04/10/2020 on evaluation today patient appears to be doing well with regard to her ulcer which is measuring somewhat smaller today. She actually ?has more granulation tissue noted which is also good news is no need for sharp debridement today. Fortunately there is no evidence of infection ?either which is also excellent. No fevers, chills, nausea, vomiting, or diarrhea. ?04/26/2020 on evaluation today patient's wound actually is showing signs of good improvement which is great news. There does not appear to be ?any evidence of active infection. I do believe she is tolerating the collagen at this point. ?05/03/2020 upon evaluation today patient's wound actually showed signs of good granulation at this time there does not appear to be any ?evidence of active infection which is great news and overall very pleased with where things stand. With that being said I do believe that the ?patient is can require some sharp debridement today but fortunately nothing too significant. ?05/11/20 on evaluation today patient appears to be doing well in regard to her leg ulcer. She's been tolerating the dressing changes without ?complication.  Fortunately there is no signs of active infection at this time.  Overall I'm very pleased with where things stand. ?05/18/2020 upon evaluation today patient's wound is showing signs of improvement is very dry and I think the collagen for the most part just ?dried to the wound bed. I am going to clean this away with sharp debridement in order to get this under control in my opinion. ?05/25/2020 upon evaluation today patient actually appears to be doing well in regard to her leg ulcer. In fact upon inspection it appears she could ?potentially be completely healed but that is not guaranteed based on what we are seeing. There does not appear to be any signs of active ?infection which is great news. ?05/31/2020 on evaluation today patient appears to be doing well with regard to her wounds. She in fact appears to be almost completely healed on ?the left leg there is just a very small opening remaining point 1 ?06/08/2020 upon evaluation today patient appears to be doing excellent in regard to her leg ulcer on the left in fact it appears to be that she is ?completely healed as of today. I see no signs of active infection at this time which is great news. No fevers, chills, nausea, vomiting, or diarrhea. ?READMISSION ?01/16/2021 ?Patient that we have had in this clinic with a wound on the left posterior calf in the summer 2021 extending into October. She was felt to have ?chronic venous insufficiency. Per the patient's description she was discharged with what sounds like external compression stockings although she ?could not afford the "$75 per leg". She therefore did not get anything. Apparently her wound that she has currently is been in existence since ?January. She has been followed at vein and vascular with Unna boots and I think calcium alginate. She had ABIs done on 06/25/2020 that showed ?an noncompressible ABI on the right leg and the left leg but triphasic waveforms with good great toe pressures and a TBI of 0.90 on the  right and ?0.91 on the left ?Surprisingly the patient also had venous reflux studies that were really unremarkable. This included no evidence of DVTs or SVTs but there was no ?evidence of deep venous insufficiency or superficial venous insufficiency in the greater saphenous or short saphenous veins bilaterally. I would ?wonder about more central venous issues or perhaps this is all lymphedema ?01/25/2021 upon evaluation today patient appears to be doing decently well and guard to her wound. The good news is the Iodoflex that is job she ?saw Dr. Dellia Nims last week for readmission and to be honest I think that this has done extremely well over that week. I do believe that she can ?tolerate the sharp debridement today which will be good. I think that cleaning off the wound will likely allow [place] to be able to use a different type ?of dressing I do not think the Iodoflex will even be necessary based on how clean the wound looks. Fortunately there is no sign of active infection ?at this time which is great news. No fevers, chills, nausea, vomiting, or diarrhea. ?02/12/2021 upon evaluation today patient appears to be doing well with regard to her leg ulcer. We did switch to alginate when she came for nurse ?visit I think is doing much better for her. Fortunately there does not appear to be any signs of active infection at this time which is great news. No ?fevers, chills, nausea, vomiting, or diarrhea. ?[place], [place] ([id number]) ?02/18/2021 upon evaluation today patient's wound is actually showing signs of good improvement. I am very pleased with where things stand ?  currently. I do not see any signs of active infection which is great news and overall I feel like she is making good progress. The patient likewise is ?happy that things are doing so well and that this is measuring somewhat smaller. ?02/25/2021 upon evaluation today patient actually appears to be doing quite well in regard to her wounds. She has been tolerating the  dressing ?changes without complication. Fortunately there does not appear to be any signs of active infection which is great news and overall I am ?extremely pleased with where things stand. No fevers, chill

## 2021-12-25 NOTE — Progress Notes (Signed)
[place], [place] ([id number]) ?Visit Report for 12/24/2021 ?Arrival Information Details ?Patient Name: [name], [name] ?Date of Service: 12/24/2021 2:45 PM ?Medical Record Number: [id number] ?Patient Account Number: [id number] ?Date of Birth/Sex: 10/11/1945 (76 y.o. F) ?Treating RN: Carlene Coria ?Primary Care Mittie Knittel: [name] Other Clinician: ?Referring Kandra Graven: [name] ?Treating Devyne Hauger/Extender: [name] ?Weeks in Treatment: 48 ?Visit Information History Since Last Visit ?All ordered tests and consults were completed: No ?Patient Arrived: Wheel Chair ?Added or deleted any medications: No ?Arrival Time: 15:15 ?Any new allergies or adverse reactions: No ?Accompanied By: self ?Had a fall or experienced change in No ?Transfer Assistance: None ?activities of daily living that may affect ?Patient Identification Verified: Yes ?risk of falls: ?Secondary Verification Process Completed: Yes ?Signs or symptoms of abuse/neglect since last visito No ?Patient Requires Transmission-Based Precautions: No ?Hospitalized since last visit: No ?Patient Has Alerts: Yes ?Implantable device outside of the clinic excluding No ?Patient Alerts: NOT DIABETIC ?cellular tissue based products placed in the center ?since last visit: ?Has Dressing in Place as Prescribed: Yes ?Has Compression in Place as Prescribed: Yes ?Pain Present Now: No ?Electronic Signature(s) ?Signed: 12/24/2021 4:47:42 PM By: Carlene Coria RN ?Entered By: Carlene Coria on 12/24/2021 15:24:21 ?[place], [name] ([id number]) ?-------------------------------------------------------------------------------- ?Clinic Level of Care Assessment Details ?Patient Name: [name], [name] ?Date of Service: 12/24/2021 2:45 PM ?Medical Record Number: [id number] ?Patient Account Number: [id number] ?Date of Birth/Sex: June 13, 1946 (76 y.o. F) ?Treating RN: Carlene Coria ?Primary Care Lareen Mullings: [name] Other Clinician: ?Referring Malcom Selmer: [name] ?Treating Ebb Carelock/Extender:  [name] ?Weeks in Treatment: 48 ?Clinic Level of Care Assessment Items ?TOOL 4 Quantity Score ?'[]'$  - Use when only an EandM is performed on FOLLOW-UP visit 0 ?ASSESSMENTS - Nursing Assessment / Reassessment ?'[]'$  - Reassessment of Co-morbidities (includes updates in patient status) 0 ?'[]'$  - 0 ?Reassessment of Adherence to Treatment Plan ?ASSESSMENTS - Wound and Skin Assessment / Reassessment ?'[]'$  - Simple Wound Assessment / Reassessment - one wound 0 ?'[]'$  - 0 ?Complex Wound Assessment / Reassessment - multiple wounds ?'[]'$  - 0 ?Dermatologic / Skin Assessment (not related to wound area) ?ASSESSMENTS - Focused Assessment ?'[]'$  - Circumferential Edema Measurements - multi extremities 0 ?'[]'$  - 0 ?Nutritional Assessment / Counseling / Intervention ?'[]'$  - 0 ?Lower Extremity Assessment (monofilament, tuning fork, pulses) ?'[]'$  - 0 ?Peripheral Arterial Disease Assessment (using hand held doppler) ?ASSESSMENTS - Ostomy and/or Continence Assessment and Care ?'[]'$  - Incontinence Assessment and Management 0 ?'[]'$  - 0 ?Ostomy Care Assessment and Management (repouching, etc.) ?PROCESS - Coordination of Care ?'[]'$  - Simple Patient / Family Education for ongoing care 0 ?'[]'$  - 0 ?Complex (extensive) Patient / Family Education for ongoing care ?'[]'$  - 0 ?Staff obtains Consents, Records, Test Results / Process Orders ?'[]'$  - 0 ?Staff telephones HHA, Nursing Homes / Clarify orders / etc ?'[]'$  - 0 ?Routine Transfer to another Facility (non-emergent condition) ?'[]'$  - 0 ?Routine Hospital Admission (non-emergent condition) ?'[]'$  - 0 ?New Admissions / Biomedical engineer / Ordering NPWT, Apligraf, etc. ?'[]'$  - 0 ?Emergency Hospital Admission (emergent condition) ?'[]'$  - 0 ?Simple Discharge Coordination ?'[]'$  - 0 ?Complex (extensive) Discharge Coordination ?PROCESS - Special Needs ?'[]'$  - Pediatric / Minor Patient Management 0 ?'[]'$  - 0 ?Isolation Patient Management ?'[]'$  - 0 ?Hearing / Language / Visual special needs ?'[]'$  - 0 ?Assessment of Community assistance  (transportation, D/C planning, etc.) ?'[]'$  - 0 ?Additional assistance / Altered mentation ?'[]'$  - 0 ?Support Surface(s) Assessment (bed, cushion, seat, etc.) ?INTERVENTIONS - Wound Cleansing / Measurement ?[name], [place] ([id number]) ?'[]'$  - 0 ?Simple  Wound Cleansing - one wound ?'[]'$  - 0 ?Complex Wound Cleansing - multiple wounds ?'[]'$  - 0 ?Wound Imaging (photographs - any number of wounds) ?'[]'$  - 0 ?Wound Tracing (instead of photographs) ?'[]'$  - 0 ?Simple Wound Measurement - one wound ?'[]'$  - 0 ?Complex Wound Measurement - multiple wounds ?INTERVENTIONS - Wound Dressings ?'[]'$  - Small Wound Dressing one or multiple wounds 0 ?'[]'$  - 0 ?Medium Wound Dressing one or multiple wounds ?'[]'$  - 0 ?Large Wound Dressing one or multiple wounds ?'[]'$  - 0 ?Application of Medications - topical ?'[]'$  - 0 ?Application of Medications - injection ?INTERVENTIONS - Miscellaneous ?'[]'$  - External ear exam 0 ?'[]'$  - 0 ?Specimen Collection (cultures, biopsies, blood, body fluids, etc.) ?'[]'$  - 0 ?Specimen(s) / Culture(s) sent or taken to Lab for analysis ?'[]'$  - 0 ?Patient Transfer (multiple staff / Civil Service fast streamer / Similar devices) ?'[]'$  - 0 ?Simple Staple / Suture removal (25 or less) ?'[]'$  - 0 ?Complex Staple / Suture removal (26 or more) ?'[]'$  - 0 ?Hypo / Hyperglycemic Management (close monitor of Blood Glucose) ?'[]'$  - 0 ?Ankle / Brachial Index (ABI) - do not check if billed separately ?'[]'$  - 0 ?Vital Signs ?Has the patient been seen at the hospital within the last three years: Yes ?Total Score: 0 ?Level Of Care: ____ ?Electronic Signature(s) ?Signed: 12/24/2021 4:47:42 PM By: Carlene Coria RN ?Entered By: Carlene Coria on 12/24/2021 15:49:17 ?[name], [name] ([id number]) ?-------------------------------------------------------------------------------- ?Compression Therapy Details ?Patient Name: [name], [name] ?Date of Service: 12/24/2021 2:45 PM ?Medical Record Number: [id number] ?Patient Account Number: [id number] ?Date of Birth/Sex: 10-24-1945 (76 y.o. F) ?Treating RN: Carlene Coria ?Primary Care Vayla Wilhelmi: [name] Other Clinician: ?Referring Kahlen Morais: [name] ?Treating Coreyon Nicotra/Extender: [name] ?Weeks in Treatment: 48 ?Compression Therapy Performed for Wound Assessment: Wound #3 Left Lower Leg ?Performed By: Clinician Carlene Coria, RN ?Compression Type: Three Layer ?Post Procedure Diagnosis ?Same as Pre-procedure ?Electronic Signature(s) ?Signed: 12/24/2021 4:47:42 PM By: Carlene Coria RN ?Entered By: Carlene Coria on 12/24/2021 15:46:53 ?[name], [name] ([id number]) ?-------------------------------------------------------------------------------- ?Compression Therapy Details ?Patient Name: [name], [name] ?Date of Service: 12/24/2021 2:45 PM ?Medical Record Number: [id number] ?Patient Account Number: [id number] ?Date of Birth/Sex: 10/13/1945 (76 y.o. F) ?Treating RN: Carlene Coria ?Primary Care Jaydee Conran: [name] Other Clinician: ?Referring Danilynn Jemison: [name] ?Treating Malkie Wille/Extender: [name] ?Weeks in Treatment: 48 ?Compression Therapy Performed for Wound Assessment: Wound #4 Right,Lateral Lower Leg ?Performed By: Clinician Carlene Coria, RN ?Compression Type: Four Layer ?Post Procedure Diagnosis ?Same as Pre-procedure ?Electronic Signature(s) ?Signed: 12/24/2021 4:47:42 PM By: Carlene Coria RN ?Entered By: Carlene Coria on 12/24/2021 15:48:02 ?[name], [name] ([id number]) ?-------------------------------------------------------------------------------- ?Encounter Discharge Information Details ?Patient Name: [name], [name] ?Date of Service: 12/24/2021 2:45 PM ?Medical Record Number: [id number] ?Patient Account Number: [id number] ?Date of Birth/Sex: 1946-01-18 (76 y.o. F) ?Treating RN: Carlene Coria ?Primary Care Holger Sokolowski: [name] Other Clinician: ?Referring Rivan Siordia: [name] ?Treating Lorenia Hoston/Extender: [name] ?Weeks in Treatment: 48 ?Encounter Discharge Information Items ?Discharge Condition: Stable ?Ambulatory Status: Wheelchair ?Discharge  Destination: Home ?Transportation: Private Auto ?Accompanied By: self ?Schedule Follow-up Appointment: Yes ?Clinical Summary of Care: Patient Declined ?Electronic Signature(s) ?Signed: 12/24/2021 4:47:42 PM By: Carlene Coria

## 2021-12-31 ENCOUNTER — Encounter: Payer: Medicare Other | Admitting: Physician Assistant

## 2021-12-31 DIAGNOSIS — I890 Lymphedema, not elsewhere classified: Secondary | ICD-10-CM | POA: Diagnosis not present

## 2022-01-01 NOTE — Progress Notes (Addendum)
[place], [place] ([id number]) ?Visit Report for 12/31/2021 ?Chief Complaint Document Details ?Patient Name: [name], [name] ?Date of Service: 12/31/2021 2:45 PM ?Medical Record Number: [id number] ?Patient Account Number: [id number] ?Date of Birth/Sex: 23-May-1946 (76 y.o. F) ?Treating RN: Carlene Coria ?Primary Care Provider: [name] Other Clinician: ?Referring Provider: [name] ?Treating Provider/Extender: [name] ?Weeks in Treatment: 49 ?Information Obtained from: Patient ?Chief Complaint ?Bilateral LE Ulcers ?Electronic Signature(s) ?Signed: 12/31/2021 2:58:29 PM By: Worthy Keeler PA-C ?Entered By: Worthy Keeler on 12/31/2021 14:58:28 ?[place], [place] ([id number]) ?-------------------------------------------------------------------------------- ?Debridement Details ?Patient Name: [name], [name] ?Date of Service: 12/31/2021 2:45 PM ?Medical Record Number: [id number] ?Patient Account Number: [id number] ?Date of Birth/Sex: [birth year] (76 y.o. F) ?Treating RN: Carlene Coria ?Primary Care Provider: [name] Other Clinician: ?Referring Provider: [name] ?Treating Provider/Extender: [name] ?Weeks in Treatment: 49 ?Debridement Performed for ?Wound #4 Right,Lateral Lower Leg ?Assessment: ?Performed By: Physician Tommie Sams., PA-C ?Debridement Type: Debridement ?Severity of Tissue Pre Debridement: Fat layer exposed ?Level of Consciousness (Pre- ?Awake and Alert ?procedure): ?Pre-procedure Verification/Time Out ?Yes - 15:15 ?Taken: ?Start Time: 15:15 ?Total Area Debrided (L x W): 4 (cm) x 3 (cm) = 12 (cm?) ?Tissue and other material ?Viable, Non-Viable, Slough, Subcutaneous, Skin: Dermis , Skin: Epidermis, Slough ?debrided: ?Level: Skin/Subcutaneous Tissue ?Debridement Description: Excisional ?Instrument: Curette ?Bleeding: Minimum ?Hemostasis Achieved: Pressure ?End Time: 15:24 ?Procedural Pain: 0 ?Post Procedural Pain: 0 ?Response to Treatment: Procedure was tolerated well ?Level of  Consciousness (Post- ?Awake and Alert ?procedure): ?Post Debridement Measurements of Total Wound ?Length: (cm) 4 ?Width: (cm) 3 ?Depth: (cm) 0.1 ?Volume: (cm?) 0.942 ?Character of Wound/Ulcer Post Debridement: Improved ?Severity of Tissue Post Debridement: Fat layer exposed ?Post Procedure Diagnosis ?Same as Pre-procedure ?Electronic Signature(s) ?Signed: 12/31/2021 6:11:27 PM By: Worthy Keeler PA-C ?Signed: 01/01/2022 8:47:39 AM By: Carlene Coria RN ?Entered By: Carlene Coria on 12/31/2021 15:25:28 ?[name] ([id number]) ?-------------------------------------------------------------------------------- ?HPI Details ?Patient Name: [name], [name] ?Date of Service: 12/31/2021 2:45 PM ?Medical Record Number: [id number] ?Patient Account Number: [id number] ?Date of Birth/Sex: Oct 31, 1945 (76 y.o. F) ?Treating RN: Carlene Coria ?Primary Care Provider: [name] Other Clinician: ?Referring Provider: [name] ?Treating Provider/Extender: [name] ?Weeks in Treatment: 49 ?History of Present Illness ?HPI Description: 03/15/2020 upon evaluation today patient presents today for initial evaluation here in our clinic concerning a wound that is on ?the left posterior lower extremity. Unfortunately this has been giving the patient some discomfort at this point which she notes has been affected ?her pretty much daily. The patient does have a history of venous insufficiency, hypertension, congestive heart failure, and a history of breast ?cancer. Fortunately there does not appear to be evidence of active infection at this time which is great news. No fevers, chills, nausea, vomiting, ?or diarrhea. Wound is not extremely large but does have some slough covering the surface of the wound. This is going require sharp debridement ?today. ?03/15/2020 upon evaluation today patient appears to be doing fairly well in regard to her wound. She does have some slough noted on the surface ?of the wound currently but I do believe the  Iodoflex has been beneficial over the past week. There is no signs of active infection at this time which ?is great news and overall very pleased with the progress. No fevers, chills, nausea, vomiting, or diarrhea. ?04/02/2020 upon evaluation today patient appears to be doing a little better in regard to her wound size wise this is not tremendously smaller she ?does have some slough buildup on the surface of the wound. With  that being said this is going require some sharp debridement today to clear ?away some of the necrotic debris. Fortunately there is no evidence of active infection at this time. No fevers, chills, nausea, vomiting, or diarrhea. ?04/10/2020 on evaluation today patient appears to be doing well with regard to her ulcer which is measuring somewhat smaller today. She actually ?has more granulation tissue noted which is also good news is no need for sharp debridement today. Fortunately there is no evidence of infection ?either which is also excellent. No fevers, chills, nausea, vomiting, or diarrhea. ?04/26/2020 on evaluation today patient's wound actually is showing signs of good improvement which is great news. There does not appear to be ?any evidence of active infection. I do believe she is tolerating the collagen at this point. ?05/03/2020 upon evaluation today patient's wound actually showed signs of good granulation at this time there does not appear to be any ?evidence of active infection which is great news and overall very pleased with where things stand. With that being said I do believe that the ?patient is can require some sharp debridement today but fortunately nothing too significant. ?05/11/20 on evaluation today patient appears to be doing well in regard to her leg ulcer. She's been tolerating the dressing changes without ?complication. Fortunately there is no signs of active infection at this time. Overall I'm very pleased with where things stand. ?05/18/2020 upon evaluation today patient's  wound is showing signs of improvement is very dry and I think the collagen for the most part just ?dried to the wound bed. I am going to clean this away with sharp debridement in order to get this under control in my opinion. ?05/25/2020 upon evaluation today patient actually appears to be doing well in regard to her leg ulcer. In fact upon inspection it appears she could ?potentially be completely healed but that is not guaranteed based on what we are seeing. There does not appear to be any signs of active ?infection which is great news. ?05/31/2020 on evaluation today patient appears to be doing well with regard to her wounds. She in fact appears to be almost completely healed on ?the left leg there is just a very small opening remaining point 1 ?06/08/2020 upon evaluation today patient appears to be doing excellent in regard to her leg ulcer on the left in fact it appears to be that she is ?completely healed as of today. I see no signs of active infection at this time which is great news. No fevers, chills, nausea, vomiting, or diarrhea. ?READMISSION ?01/16/2021 ?Patient that we have had in this clinic with a wound on the left posterior calf in the summer 2021 extending into October. She was felt to have ?chronic venous insufficiency. Per the patient's description she was discharged with what sounds like external compression stockings although she ?could not afford the "$75 per leg". She therefore did not get anything. Apparently her wound that she has currently is been in existence since ?January. She has been followed at vein and vascular with Unna boots and I think calcium alginate. She had ABIs done on 06/25/2020 that showed ?an noncompressible ABI on the right leg and the left leg but triphasic waveforms with good great toe pressures and a TBI of 0.90 on the right and ?0.91 on the left ?Surprisingly the patient also had venous reflux studies that were really unremarkable. This included no evidence of DVTs or  SVTs but there was no ?evidence of deep venous insufficiency or superficial venous insufficiency  in the greater saphenous or short saphenous veins bilaterally. I would ?wonder about more central venous issues or perhaps t

## 2022-01-01 NOTE — Progress Notes (Addendum)
[place], [place] ([id number]) ?Visit Report for 12/31/2021 ?Arrival Information Details ?Patient Name: [name], [name] ?Date of Service: 12/31/2021 2:45 PM ?Medical Record Number: [id number] ?Patient Account Number: [id number] ?Date of Birth/Sex: 04/23/1946 (76 y.o. F) ?Treating RN: [name] ?Primary Care [name]: [name] Other Clinician: ?Referring [name]: [name] ?Treating [name]/Extender: [name] ?Weeks in Treatment: 49 ?Visit Information History Since Last Visit ?All ordered tests and consults were completed: No ?Patient Arrived: Wheel Chair ?Added or deleted any medications: No ?Arrival Time: 14:50 ?Any new allergies or adverse reactions: No ?Accompanied By: self ?Had a fall or experienced change in No ?Transfer Assistance: None ?activities of daily living that may affect ?Patient Identification Verified: Yes ?risk of falls: ?Secondary Verification Process Completed: Yes ?Signs or symptoms of abuse/neglect since last visito No ?Patient Requires Transmission-Based Precautions: No ?Hospitalized since last visit: No ?Patient Has Alerts: Yes ?Implantable device outside of the clinic excluding No ?Patient Alerts: NOT DIABETIC ?cellular tissue based products placed in the center ?since last visit: ?Has Dressing in Place as Prescribed: Yes ?Has Compression in Place as Prescribed: Yes ?Pain Present Now: No ?Electronic Signature(s) ?Signed: 01/01/2022 8:47:39 AM By: [name] Coria RN ?Entered By: [name] on 12/31/2021 14:59:13 ?[place], [name] ([id number]) ?-------------------------------------------------------------------------------- ?Clinic Level of Care Assessment Details ?Patient Name: [name], [name] ?Date of Service: 12/31/2021 2:45 PM ?Medical Record Number: [id number] ?Patient Account Number: [id number] ?Date of Birth/Sex: 09-05-1946 (76 y.o. F) ?Treating RN: [name] ?Primary Care [name]: [name] Other Clinician: ?Referring [name]: [name] ?Treating [name]/Extender:  [name] ?Weeks in Treatment: 49 ?Clinic Level of Care Assessment Items ?TOOL 4 Quantity Score ?'[]'$  - Use when only an EandM is performed on FOLLOW-UP visit 0 ?ASSESSMENTS - Nursing Assessment / Reassessment ?'[]'$  - Reassessment of Co-morbidities (includes updates in patient status) 0 ?'[]'$  - 0 ?Reassessment of Adherence to Treatment Plan ?ASSESSMENTS - Wound and Skin Assessment / Reassessment ?'[]'$  - Simple Wound Assessment / Reassessment - one wound 0 ?'[]'$  - 0 ?Complex Wound Assessment / Reassessment - multiple wounds ?'[]'$  - 0 ?Dermatologic / Skin Assessment (not related to wound area) ?ASSESSMENTS - Focused Assessment ?'[]'$  - Circumferential Edema Measurements - multi extremities 0 ?'[]'$  - 0 ?Nutritional Assessment / Counseling / Intervention ?'[]'$  - 0 ?Lower Extremity Assessment (monofilament, tuning fork, pulses) ?'[]'$  - 0 ?Peripheral Arterial Disease Assessment (using hand held doppler) ?ASSESSMENTS - Ostomy and/or Continence Assessment and Care ?'[]'$  - Incontinence Assessment and Management 0 ?'[]'$  - 0 ?Ostomy Care Assessment and Management (repouching, etc.) ?PROCESS - Coordination of Care ?'[]'$  - Simple Patient / Family Education for ongoing care 0 ?'[]'$  - 0 ?Complex (extensive) Patient / Family Education for ongoing care ?'[]'$  - 0 ?Staff obtains Consents, Records, Test Results / Process Orders ?'[]'$  - 0 ?Staff telephones HHA, Nursing Homes / Clarify orders / etc ?'[]'$  - 0 ?Routine Transfer to another Facility (non-emergent condition) ?'[]'$  - 0 ?Routine Hospital Admission (non-emergent condition) ?'[]'$  - 0 ?New Admissions / Biomedical engineer / Ordering NPWT, Apligraf, etc. ?'[]'$  - 0 ?Emergency Hospital Admission (emergent condition) ?'[]'$  - 0 ?Simple Discharge Coordination ?'[]'$  - 0 ?Complex (extensive) Discharge Coordination ?PROCESS - Special Needs ?'[]'$  - Pediatric / Minor Patient Management 0 ?'[]'$  - 0 ?Isolation Patient Management ?'[]'$  - 0 ?Hearing / Language / Visual special needs ?'[]'$  - 0 ?Assessment of Community assistance  (transportation, D/C planning, etc.) ?'[]'$  - 0 ?Additional assistance / Altered mentation ?'[]'$  - 0 ?Support Surface(s) Assessment (bed, cushion, seat, etc.) ?INTERVENTIONS - Wound Cleansing / Measurement ?[place], [place] ([id number]) ?'[]'$  - 0 ?Simple  Wound Cleansing - one wound ?'[]'$  - 0 ?Complex Wound Cleansing - multiple wounds ?'[]'$  - 0 ?Wound Imaging (photographs - any number of wounds) ?'[]'$  - 0 ?Wound Tracing (instead of photographs) ?'[]'$  - 0 ?Simple Wound Measurement - one wound ?'[]'$  - 0 ?Complex Wound Measurement - multiple wounds ?INTERVENTIONS - Wound Dressings ?'[]'$  - Small Wound Dressing one or multiple wounds 0 ?'[]'$  - 0 ?Medium Wound Dressing one or multiple wounds ?'[]'$  - 0 ?Large Wound Dressing one or multiple wounds ?'[]'$  - 0 ?Application of Medications - topical ?'[]'$  - 0 ?Application of Medications - injection ?INTERVENTIONS - Miscellaneous ?'[]'$  - External ear exam 0 ?'[]'$  - 0 ?Specimen Collection (cultures, biopsies, blood, body fluids, etc.) ?'[]'$  - 0 ?Specimen(s) / Culture(s) sent or taken to Lab for analysis ?'[]'$  - 0 ?Patient Transfer (multiple staff / Civil Service fast streamer / Similar devices) ?'[]'$  - 0 ?Simple Staple / Suture removal (25 or less) ?'[]'$  - 0 ?Complex Staple / Suture removal (26 or more) ?'[]'$  - 0 ?Hypo / Hyperglycemic Management (close monitor of Blood Glucose) ?'[]'$  - 0 ?Ankle / Brachial Index (ABI) - do not check if billed separately ?'[]'$  - 0 ?Vital Signs ?Has the patient been seen at the hospital within the last three years: Yes ?Total Score: 0 ?Level Of Care: ____ ?Electronic Signature(s) ?Signed: 01/01/2022 8:47:39 AM By: [name] Coria RN ?Entered By: [name] on 12/31/2021 15:26:04 ?[name], [name] ([id number]) ?-------------------------------------------------------------------------------- ?Encounter Discharge Information Details ?Patient Name: [name], [name] ?Date of Service: 12/31/2021 2:45 PM ?Medical Record Number: [id number] ?Patient Account Number: [id number] ?Date of Birth/Sex: 06-08-1946 (76 y.o.  F) ?Treating RN: [name] ?Primary Care Tiesha Marich: [name] Other Clinician: ?Referring Jubal Rademaker: [name] ?Treating Donnica Jarnagin/Extender: [name] ?Weeks in Treatment: 49 ?Encounter Discharge Information Items Post Procedure Vitals ?Discharge Condition: Stable ?Temperature (?F): 98.2 ?Ambulatory Status: Ambulatory ?Pulse (bpm): 79 ?Discharge Destination: Home ?Respiratory Rate (breaths/min): 18 ?Transportation: Private Auto ?Blood Pressure (mmHg): 189/90 ?Accompanied By: self ?Schedule Follow-up Appointment: Yes ?Clinical Summary of Care: Patient Declined ?Electronic Signature(s) ?Signed: 01/01/2022 8:47:39 AM By: [name] Coria RN ?Entered By: [name] on 12/31/2021 15:27:24 Wynetta Emery, [name] ([id number]) ?-------------------------------------------------------------------------------- ?Lower Extremity Assessment Details ?Patient Name: [name], [name] ?Date of Service: 12/31/2021 2:45 PM ?Medical Record Number: [id number] ?Patient Account Number: [id number] ?Date of Birth/Sex: 01-Jul-1946 (76 y.o. F) ?Treating RN: [name] ?Primary Care Tukker Byrns: [name] Other Clinician: ?Referring Tyrene Nader: [name] ?Treating Hailyn Zarr/Extender: [name] ?Weeks in Treatment: 49 ?Edema Assessment ?Assessed: [Left: No] [Right: No] ?Edema: [Left: Yes] [Right: Yes] ?Calf ?Left: Right: ?Point of Measurement: 31 cm From Medial Instep 36 cm 39 cm ?Ankle ?Left: Right: ?Point of Measurement: 10 cm From Medial Instep 25 cm 25 cm ?Vascular Assessment ?Pulses: ?Dorsalis Pedis ?Palpable: [Left:Yes] [Right:Yes] ?Electronic Signature(s) ?Signed: 01/01/2022 8:47:39 AM By: [name] Coria RN ?Entered By: [name] on 12/31/2021 15:11:59 ?[place], [place] ([id number]) ?-------------------------------------------------------------------------------- ?Multi Wound Chart Details ?Patient Name: [name], [name] ?Date of Service: 12/31/2021 2:45 PM ?Medical Record Number: [id number] ?Patient Account Number: [id number] ?Date of  Birth/Sex: 1946/07/11 (76 y.o. F) ?Treating RN: [name] ?Primary Care Rasheeda Mulvehill: [name] Other Clinician: ?Referring Payden Docter: [name] ?Treating Rhylen Pulido/Extender: [name] ?Weeks in Treatment: 49 ?Vital Signs ?He

## 2022-01-03 ENCOUNTER — Encounter: Payer: Self-pay | Admitting: Licensed Clinical Social Worker

## 2022-01-03 NOTE — Progress Notes (Signed)
[facility] ?Clinical Social Work ? ?Clinical Social Work was referred by medical provider for assessment of psychosocial needs.  Clinical Social Worker contacted patient by phone  to offer support and assess for needs.  CSW left voicemail with contact information and request for return call. ? ?Third Attempt ? ?Azekiel Cremer, LCSW  ?Clinical Social Worker ?[facility] ?      ? ?

## 2022-01-07 ENCOUNTER — Encounter: Payer: Medicare Other | Attending: Physician Assistant | Admitting: Physician Assistant

## 2022-01-07 DIAGNOSIS — L97812 Non-pressure chronic ulcer of other part of right lower leg with fat layer exposed: Secondary | ICD-10-CM | POA: Insufficient documentation

## 2022-01-07 DIAGNOSIS — L97822 Non-pressure chronic ulcer of other part of left lower leg with fat layer exposed: Secondary | ICD-10-CM | POA: Diagnosis not present

## 2022-01-07 DIAGNOSIS — I87332 Chronic venous hypertension (idiopathic) with ulcer and inflammation of left lower extremity: Secondary | ICD-10-CM | POA: Insufficient documentation

## 2022-01-07 DIAGNOSIS — I890 Lymphedema, not elsewhere classified: Secondary | ICD-10-CM | POA: Insufficient documentation

## 2022-01-07 NOTE — Progress Notes (Addendum)
[place], [place] ([id number]) ?Visit Report for 01/07/2022 ?Chief Complaint Document Details ?Patient Name: ORENE, ABBASI ?Date of Service: 01/07/2022 3:45 PM ?Medical Record Number: [id number] ?Patient Account Number: [id number] ?Date of Birth/Sex: 1946-04-16 (76 y.o. F) ?Treating RN: Levora Dredge ?Primary Care Provider: [name] Other Clinician: ?Referring Provider: [name] ?Treating Provider/Extender: [name] ?Weeks in Treatment: 50 ?Information Obtained from: Patient ?Chief Complaint ?Bilateral LE Ulcers ?Electronic Signature(s) ?Signed: 01/07/2022 3:37:26 PM By: Worthy Keeler PA-C ?Entered By: Worthy Keeler on 01/07/2022 15:37:26 ?[place], Valerie ([id number]) ?-------------------------------------------------------------------------------- ?Debridement Details ?Patient Name: JOYCELYN, LISKA ?Date of Service: 01/07/2022 3:45 PM ?Medical Record Number: [id number] ?Patient Account Number: [id number] ?Date of Birth/Sex: 06-15-1946 (76 y.o. F) ?Treating RN: Levora Dredge ?Primary Care Provider: [name] Other Clinician: ?Referring Provider: [name] ?Treating Provider/Extender: [name] ?Weeks in Treatment: 50 ?Debridement Performed for ?Wound #4 Right,Lateral Lower Leg ?Assessment: ?Performed By: Physician Tommie Sams., PA-C ?Debridement Type: Debridement ?Severity of Tissue Pre Debridement: Fat layer exposed ?Level of Consciousness (Pre- ?Awake and Alert ?procedure): ?Pre-procedure Verification/Time Out ?Yes - 14:24 ?Taken: ?Total Area Debrided (L x W): 0.5 (cm) x 1 (cm) = 0.5 (cm?) ?Tissue and other material ?Non-Viable, Skin: Dermis , Skin: Epidermis ?debrided: ?Level: Skin/Epidermis ?Debridement Description: Selective/Open Wound ?Instrument: Curette ?Bleeding: None ?Response to Treatment: Procedure was tolerated well ?Level of Consciousness (Post- ?Awake and Alert ?procedure): ?Post Debridement Measurements of Total Wound ?Length: (cm) 3.5 ?Width: (cm) 4 ?Depth: (cm) 0.1 ?Volume:  (cm?) 1.1 ?Character of Wound/Ulcer Post Debridement: Stable ?Severity of Tissue Post Debridement: Fat layer exposed ?Post Procedure Diagnosis ?Same as Pre-procedure ?Electronic Signature(s) ?Signed: 01/07/2022 4:38:50 PM By: Worthy Keeler PA-C ?Signed: 01/07/2022 5:01:14 PM By: Levora Dredge ?Entered By: Levora Dredge on 01/07/2022 16:25:30 ?[name] ([id number]) ?-------------------------------------------------------------------------------- ?Debridement Details ?Patient Name: KAYMARIE, WYNN ?Date of Service: 01/07/2022 3:45 PM ?Medical Record Number: [id number] ?Patient Account Number: [id number] ?Date of Birth/Sex: [birth year] (76 y.o. F) ?Treating RN: Levora Dredge ?Primary Care Provider: [name] Other Clinician: ?Referring Provider: [name] ?Treating Provider/Extender: [name] ?Weeks in Treatment: 50 ?Debridement Performed for ?Wound #3 Left Lower Leg ?Assessment: ?Performed By: Physician Tommie Sams., PA-C ?Debridement Type: Debridement ?Level of Consciousness (Pre- ?Awake and Alert ?procedure): ?Pre-procedure Verification/Time Out ?Yes - 14:25 ?Taken: ?Total Area Debrided (L x W): 0.6 (cm) x 0.8 (cm) = 0.48 (cm?) ?Tissue and other material ?Viable, Non-Viable, Slough, Subcutaneous, Walnut ?debrided: ?Level: Skin/Subcutaneous Tissue ?Debridement Description: Excisional ?Instrument: Curette ?Bleeding: Minimum ?Hemostasis Achieved: Pressure ?Response to Treatment: Procedure was tolerated well ?Level of Consciousness (Post- ?Awake and Alert ?procedure): ?Post Debridement Measurements of Total Wound ?Length: (cm) 0.6 ?Width: (cm) 0.8 ?Depth: (cm) 0.1 ?Volume: (cm?) 0.038 ?Character of Wound/Ulcer Post Debridement: Improved ?Post Procedure Diagnosis ?Same as Pre-procedure ?Electronic Signature(s) ?Signed: 01/07/2022 4:38:50 PM By: Worthy Keeler PA-C ?Signed: 01/07/2022 5:01:14 PM By: Levora Dredge ?Entered By: Levora Dredge on 01/07/2022 16:26:51 ?MARJANI, KOBEL  ([id number]) ?-------------------------------------------------------------------------------- ?HPI Details ?Patient Name: ROISIN, MONES ?Date of Service: 01/07/2022 3:45 PM ?Medical Record Number: [id number] ?Patient Account Number: [id number] ?Date of Birth/Sex: June 06, 1946 (76 y.o. F) ?Treating RN: Levora Dredge ?Primary Care Provider: [name] Other Clinician: ?Referring Provider: [name] ?Treating Provider/Extender: [name] ?Weeks in Treatment: 50 ?History of Present Illness ?HPI Description: 03/15/2020 upon evaluation today patient presents today for initial evaluation here in our clinic concerning a wound that is on ?the left posterior lower extremity. Unfortunately this has been giving the patient some discomfort at this point which she notes has been affected ?her pretty much daily.  The patient does have a history of venous insufficiency, hypertension, congestive heart failure, and a history of breast ?cancer. Fortunately there does not appear to be evidence of active infection at this time which is great news. No fevers, chills, nausea, vomiting, ?or diarrhea. Wound is not extremely large but does have some slough covering the surface of the wound. This is going require sharp debridement ?today. ?03/15/2020 upon evaluation today patient appears to be doing fairly well in regard to her wound. She does have some slough noted on the surface ?of the wound currently but I do believe the Iodoflex has been beneficial over the past week. There is no signs of active infection at this time which ?is great news and overall very pleased with the progress. No fevers, chills, nausea, vomiting, or diarrhea. ?04/02/2020 upon evaluation today patient appears to be doing a little better in regard to her wound size wise this is not tremendously smaller she ?does have some slough buildup on the surface of the wound. With that being said this is going require some sharp debridement today to clear ?away some of the  necrotic debris. Fortunately there is no evidence of active infection at this time. No fevers, chills, nausea, vomiting, or diarrhea. ?04/10/2020 on evaluation today patient appears to be doing well with regard to her ulcer which is measuring somewhat smaller today. She actually ?has more granulation tissue noted which is also good news is no need for sharp debridement today. Fortunately there is no evidence of infection ?either which is also excellent. No fevers, chills, nausea, vomiting, or diarrhea. ?04/26/2020 on evaluation today patient's wound actually is showing signs of good improvement which is great news. There does not appear to be ?any evidence of active infection. I do believe she is tolerating the collagen at this point. ?05/03/2020 upon evaluation today patient's wound actually showed signs of good granulation at this time there does not appear to be any ?evidence of active infection which is great news and overall very pleased with where things stand. With that being said I do believe that the ?patient is can require some sharp debridement today but fortunately nothing too significant. ?05/11/20 on evaluation today patient appears to be doing well in regard to her leg ulcer. She's been tolerating the dressing changes without ?complication. Fortunately there is no signs of active infection at this time. Overall I'm very pleased with where things stand. ?05/18/2020 upon evaluation today patient's wound is showing signs of improvement is very dry and I think the collagen for the most part just ?dried to the wound bed. I am going to clean this away with sharp debridement in order to get this under control in my opinion. ?05/25/2020 upon evaluation today patient actually appears to be doing well in regard to her leg ulcer. In fact upon inspection it appears she could ?potentially be completely healed but that is not guaranteed based on what we are seeing. There does not appear to be any signs of active ?infection  which is great news. ?05/31/2020 on evaluation today patient appears to be doing well with regard to her wounds. She in fact appears to be almost completely healed on ?the left leg there is just a very small opening remaining poi

## 2022-01-07 NOTE — Progress Notes (Addendum)
[place], [place] ([id number]) ?Visit Report for 01/07/2022 ?Arrival Information Details ?Patient Name: [name], [name] ?Date of Service: 01/07/2022 3:45 PM ?Medical Record Number: [id number] ?Patient Account Number: [id number] ?Date of Birth/Sex: November 26, 1945 (76 y.o. F) ?Treating RN: Levora Dredge ?Primary Care Calton Harshfield: [name] Other Clinician: ?Referring Leelyn Jasinski: [name] ?Treating Savaya Hakes/Extender: [name] ?Weeks in Treatment: 50 ?Visit Information History Since Last Visit ?Added or deleted any medications: No ?Patient Arrived: Wheel Chair ?Any new allergies or adverse reactions: No ?Arrival Time: 16:03 ?Had a fall or experienced change in No ?Accompanied By: self ?activities of daily living that may affect ?Transfer Assistance: EasyPivot Patient ?risk of falls: ?Lift ?Hospitalized since last visit: No ?Patient Identification Verified: Yes ?Has Dressing in Place as Prescribed: Yes ?Secondary Verification Process Completed: Yes ?Has Compression in Place as Prescribed: Yes ?Patient Requires Transmission-Based No ?Pain Present Now: No ?Precautions: ?Patient Has Alerts: Yes ?Patient Alerts: NOT DIABETIC ?Electronic Signature(s) ?Signed: 01/07/2022 5:01:14 PM By: Levora Dredge ?Entered By: Levora Dredge on 01/07/2022 16:07:36 ?[place], [place] ([id number]) ?-------------------------------------------------------------------------------- ?Clinic Level of Care Assessment Details ?Patient Name: [name], [name] ?Date of Service: 01/07/2022 3:45 PM ?Medical Record Number: [id number] ?Patient Account Number: [id number] ?Date of Birth/Sex: 18-Feb-1946 (76 y.o. F) ?Treating RN: Levora Dredge ?Primary Care Bhumi Godbey: [name] Other Clinician: ?Referring Rudolph Dobler: [name] ?Treating Raimundo Corbit/Extender: [name] ?Weeks in Treatment: 50 ?Clinic Level of Care Assessment Items ?TOOL 1 Quantity Score ?'[]'$  - Use when EandM and Procedure is performed on INITIAL visit 0 ?ASSESSMENTS - Nursing Assessment /  Reassessment ?'[]'$  - General Physical Exam (combine w/ comprehensive assessment (listed just below) when performed on new ?0 ?pt. evals) ?'[]'$  - 0 ?Comprehensive Assessment (HX, ROS, Risk Assessments, Wounds Hx, etc.) ?ASSESSMENTS - Wound and Skin Assessment / Reassessment ?'[]'$  - Dermatologic / Skin Assessment (not related to wound area) 0 ?ASSESSMENTS - Ostomy and/or Continence Assessment and Care ?'[]'$  - Incontinence Assessment and Management 0 ?'[]'$  - 0 ?Ostomy Care Assessment and Management (repouching, etc.) ?PROCESS - Coordination of Care ?'[]'$  - Simple Patient / Family Education for ongoing care 0 ?'[]'$  - 0 ?Complex (extensive) Patient / Family Education for ongoing care ?'[]'$  - 0 ?Staff obtains Consents, Records, Test Results / Process Orders ?'[]'$  - 0 ?Staff telephones HHA, Nursing Homes / Clarify orders / etc ?'[]'$  - 0 ?Routine Transfer to another Facility (non-emergent condition) ?'[]'$  - 0 ?Routine Hospital Admission (non-emergent condition) ?'[]'$  - 0 ?New Admissions / Biomedical engineer / Ordering NPWT, Apligraf, etc. ?'[]'$  - 0 ?Emergency Hospital Admission (emergent condition) ?PROCESS - Special Needs ?'[]'$  - Pediatric / Minor Patient Management 0 ?'[]'$  - 0 ?Isolation Patient Management ?'[]'$  - 0 ?Hearing / Language / Visual special needs ?'[]'$  - 0 ?Assessment of Community assistance (transportation, D/C planning, etc.) ?'[]'$  - 0 ?Additional assistance / Altered mentation ?'[]'$  - 0 ?Support Surface(s) Assessment (bed, cushion, seat, etc.) ?INTERVENTIONS - Miscellaneous ?'[]'$  - External ear exam 0 ?'[]'$  - 0 ?Patient Transfer (multiple staff / Civil Service fast streamer / Similar devices) ?'[]'$  - 0 ?Simple Staple / Suture removal (25 or less) ?'[]'$  - 0 ?Complex Staple / Suture removal (26 or more) ?'[]'$  - 0 ?Hypo/Hyperglycemic Management (do not check if billed separately) ?'[]'$  - 0 ?Ankle / Brachial Index (ABI) - do not check if billed separately ?Has the patient been seen at the hospital within the last three years: Yes ?Total Score: 0 ?Level Of Care:  ____ ?[place], [place] ([id number]) ?Electronic Signature(s) ?Signed: 01/07/2022 5:01:14 PM By: Levora Dredge ?Entered By: Levora Dredge on 01/07/2022 16:44:51 ?[place], Tashae ([id number]) ?-------------------------------------------------------------------------------- ?Encounter  Discharge Information Details ?Patient Name: [name], [name] ?Date of Service: 01/07/2022 3:45 PM ?Medical Record Number: [id number] ?Patient Account Number: [id number] ?Date of Birth/Sex: Sep 05, 1946 (76 y.o. F) ?Treating RN: Levora Dredge ?Primary Care Ulmer Degen: [name] Other Clinician: ?Referring Trennon Torbeck: [name] ?Treating Romona Murdy/Extender: [name] ?Weeks in Treatment: 50 ?Encounter Discharge Information Items Post Procedure Vitals ?Discharge Condition: Stable ?Temperature (?F): 97.4 ?Ambulatory Status: Wheelchair ?Pulse (bpm): 55 ?Discharge Destination: Home ?Respiratory Rate (breaths/min): 18 ?Transportation: Other ?Blood Pressure (mmHg): 174/74 ?Accompanied By: self ?Schedule Follow-up Appointment: Yes ?Clinical Summary of Care: Patient Declined ?Electronic Signature(s) ?Signed: 01/07/2022 4:46:30 PM By: Levora Dredge ?Entered By: Levora Dredge on 01/07/2022 16:46:30 ?[name] ([id number]) ?-------------------------------------------------------------------------------- ?Lower Extremity Assessment Details ?Patient Name: [name], [name] ?Date of Service: 01/07/2022 3:45 PM ?Medical Record Number: [id number] ?Patient Account Number: [id number] ?Date of Birth/Sex: 1946/07/29 (76 y.o. F) ?Treating RN: Levora Dredge ?Primary Care Jakhia Buxton: [name] Other Clinician: ?Referring Governor Matos: [name] ?Treating Charmion Hapke/Extender: [name] ?Weeks in Treatment: 50 ?Edema Assessment ?Assessed: [Left: Yes] [Right: Yes] ?Edema: [Left: Yes] [Right: Yes] ?Calf ?Left: Right: ?Point of Measurement: 31 cm From Medial Instep 38 cm 41 cm ?Ankle ?Left: Right: ?Point of Measurement: 10 cm From Medial Instep 24.6 cm 27.5  cm ?Vascular Assessment ?Pulses: ?Dorsalis Pedis ?Palpable: [Left:Yes] [Right:Yes] ?Electronic Signature(s) ?Signed: 01/07/2022 5:01:14 PM By: Levora Dredge ?Entered By: Levora Dredge on 01/07/2022 16:23:08 ?[place], Zayda ([id number]) ?-------------------------------------------------------------------------------- ?Multi Wound Chart Details ?Patient Name: SHATONIA, HOOTS ?Date of Service: 01/07/2022 3:45 PM ?Medical Record Number: [id number] ?Patient Account Number: [id number] ?Date of Birth/Sex: Feb 20, 1946 (76 y.o. F) ?Treating RN: Levora Dredge ?Primary Care Ramil Edgington: [name] Other Clinician: ?Referring Jazline Cumbee: [name] ?Treating Eshan Trupiano/Extender: [name] ?Weeks in Treatment: 50 ?Vital Signs ?Height(in): 69 ?Pulse(bpm): 55 ?Weight(lbs): 244 ?Blood Pressure(mmHg): 174/74 ?Body Mass Index(BMI): 36 ?Temperature(??F): 97.4 ?Respiratory Rate(breaths/min): 18 ?Photos: [N/A:N/A] ?Wound Location: Left Lower Leg Right, Lateral Lower Leg N/A ?Wounding Event: Gradually Appeared Gradually Appeared N/A ?Primary Etiology: Lymphedema Venous Leg Ulcer N/A ?Comorbid History: Cataracts, Lymphedema, Sleep Cataracts, Lymphedema, Sleep N/A ?Apnea, Congestive Heart Failure, Apnea, Congestive Heart Failure, ?Hypertension, Osteoarthritis, Hypertension, Osteoarthritis, ?Neuropathy, Received Neuropathy, Received ?Chemotherapy, Received Radiation Chemotherapy, Received Radiation ?Date Acquired: 08/15/2021 10/27/2021 N/A ?Weeks of Treatment: 18 10 N/A ?Wound Status: Open Open N/A ?Wound Recurrence: No No N/A ?Measurements L x W x D (cm) 0.6x0.8x0.1 3.5x4x0.1 N/A ?Area (cm?) : 0.377 10.996 N/A ?Volume (cm?) : 0.038 1.1 N/A ?% Reduction in Area: 99.40% -833.40% N/A ?% Reduction in Volume: 99.40% -832.20% N/A ?Classification: Full Thickness Without Exposed Full Thickness Without Exposed N/A ?Support Structures Support Structures ?Exudate Amount: Medium Medium N/A ?Exudate Type: Serosanguineous Serosanguineous N/A ?Exudate  Color: red, brown red, brown N/A ?Granulation Amount: Large (67-100%) Medium (34-66%) N/A ?Granulation Quality: Red, Hyper-granulation Red N/A ?Necrotic Amount: Small (1-33%) Medium (34-66%) N/A ?Exposed Structures: ?Fat Lay

## 2022-01-14 ENCOUNTER — Encounter: Payer: Medicare Other | Admitting: Physician Assistant

## 2022-01-14 DIAGNOSIS — I890 Lymphedema, not elsewhere classified: Secondary | ICD-10-CM | POA: Diagnosis not present

## 2022-01-14 NOTE — Progress Notes (Addendum)
[place], [place] ([id number]) ?Visit Report for 01/14/2022 ?Arrival Information Details ?Patient Name: [name], [name] ?Date of Service: 01/14/2022 3:15 PM ?Medical Record Number: [id number] ?Patient Account Number: [id number] ?Date of Birth/Sex: [birth year] (76 y.o. F) ?Treating RN: Carlene Coria ?Primary Care Aimee Heldman: [name] Other Clinician: ?Referring Olegario Emberson: [name] ?Treating Breunna Nordmann/Extender: [name] ?Weeks in Treatment: 51 ?Visit Information History Since Last Visit ?All ordered tests and consults were completed: No ?Patient Arrived: Wheel Chair ?Added or deleted any medications: No ?Arrival Time: 15:35 ?Any new allergies or adverse reactions: No ?Accompanied By: self ?Had a fall or experienced change in No ?Transfer Assistance: None ?activities of daily living that may affect ?Patient Identification Verified: Yes ?risk of falls: ?Secondary Verification Process Completed: Yes ?Signs or symptoms of abuse/neglect since last visito No ?Patient Requires Transmission-Based Precautions: No ?Hospitalized since last visit: No ?Patient Has Alerts: Yes ?Implantable device outside of the clinic excluding No ?Patient Alerts: NOT DIABETIC ?cellular tissue based products placed in the center ?since last visit: ?Has Dressing in Place as Prescribed: Yes ?Pain Present Now: No ?Electronic Signature(s) ?Signed: 01/15/2022 3:40:11 PM By: Carlene Coria RN ?Entered By: Carlene Coria on 01/14/2022 15:40:24 ?[name] ([id number]) ?-------------------------------------------------------------------------------- ?Clinic Level of Care Assessment Details ?Patient Name: [name], [name] ?Date of Service: 01/14/2022 3:15 PM ?Medical Record Number: [id number] ?Patient Account Number: [id number] ?Date of Birth/Sex: [birth year] (76 y.o. F) ?Treating RN: Carlene Coria ?Primary Care Harryette Shuart: [name] Other Clinician: ?Referring Antario Yasuda: [name] ?Treating Dominick Zertuche/Extender: [name] ?Weeks in Treatment: 51 ?Clinic Level  of Care Assessment Items ?TOOL 4 Quantity Score ?'[]'$  - Use when only an EandM is performed on FOLLOW-UP visit 0 ?ASSESSMENTS - Nursing Assessment / Reassessment ?'[]'$  - Reassessment of Co-morbidities (includes updates in patient status) 0 ?'[]'$  - 0 ?Reassessment of Adherence to Treatment Plan ?ASSESSMENTS - Wound and Skin Assessment / Reassessment ?'[]'$  - Simple Wound Assessment / Reassessment - one wound 0 ?'[]'$  - 0 ?Complex Wound Assessment / Reassessment - multiple wounds ?'[]'$  - 0 ?Dermatologic / Skin Assessment (not related to wound area) ?ASSESSMENTS - Focused Assessment ?'[]'$  - Circumferential Edema Measurements - multi extremities 0 ?'[]'$  - 0 ?Nutritional Assessment / Counseling / Intervention ?'[]'$  - 0 ?Lower Extremity Assessment (monofilament, tuning fork, pulses) ?'[]'$  - 0 ?Peripheral Arterial Disease Assessment (using hand held doppler) ?ASSESSMENTS - Ostomy and/or Continence Assessment and Care ?'[]'$  - Incontinence Assessment and Management 0 ?'[]'$  - 0 ?Ostomy Care Assessment and Management (repouching, etc.) ?PROCESS - Coordination of Care ?'[]'$  - Simple Patient / Family Education for ongoing care 0 ?'[]'$  - 0 ?Complex (extensive) Patient / Family Education for ongoing care ?'[]'$  - 0 ?Staff obtains Consents, Records, Test Results / Process Orders ?'[]'$  - 0 ?Staff telephones HHA, Nursing Homes / Clarify orders / etc ?'[]'$  - 0 ?Routine Transfer to another Facility (non-emergent condition) ?'[]'$  - 0 ?Routine Hospital Admission (non-emergent condition) ?'[]'$  - 0 ?New Admissions / Biomedical engineer / Ordering NPWT, Apligraf, etc. ?'[]'$  - 0 ?Emergency Hospital Admission (emergent condition) ?'[]'$  - 0 ?Simple Discharge Coordination ?'[]'$  - 0 ?Complex (extensive) Discharge Coordination ?PROCESS - Special Needs ?'[]'$  - Pediatric / Minor Patient Management 0 ?'[]'$  - 0 ?Isolation Patient Management ?'[]'$  - 0 ?Hearing / Language / Visual special needs ?'[]'$  - 0 ?Assessment of Community assistance (transportation, D/C planning, etc.) ?'[]'$  - 0 ?Additional  assistance / Altered mentation ?'[]'$  - 0 ?Support Surface(s) Assessment (bed, cushion, seat, etc.) ?INTERVENTIONS - Wound Cleansing / Measurement ?[place], [place] ([id number]) ?'[]'$  - 0 ?Simple Wound Cleansing - one wound ?'[]'$  -  0 ?Complex Wound Cleansing - multiple wounds ?'[]'$  - 0 ?Wound Imaging (photographs - any number of wounds) ?'[]'$  - 0 ?Wound Tracing (instead of photographs) ?'[]'$  - 0 ?Simple Wound Measurement - one wound ?'[]'$  - 0 ?Complex Wound Measurement - multiple wounds ?INTERVENTIONS - Wound Dressings ?'[]'$  - Small Wound Dressing one or multiple wounds 0 ?'[]'$  - 0 ?Medium Wound Dressing one or multiple wounds ?'[]'$  - 0 ?Large Wound Dressing one or multiple wounds ?'[]'$  - 0 ?Application of Medications - topical ?'[]'$  - 0 ?Application of Medications - injection ?INTERVENTIONS - Miscellaneous ?'[]'$  - External ear exam 0 ?'[]'$  - 0 ?Specimen Collection (cultures, biopsies, blood, body fluids, etc.) ?'[]'$  - 0 ?Specimen(s) / Culture(s) sent or taken to Lab for analysis ?'[]'$  - 0 ?Patient Transfer (multiple staff / Civil Service fast streamer / Similar devices) ?'[]'$  - 0 ?Simple Staple / Suture removal (25 or less) ?'[]'$  - 0 ?Complex Staple / Suture removal (26 or more) ?'[]'$  - 0 ?Hypo / Hyperglycemic Management (close monitor of Blood Glucose) ?'[]'$  - 0 ?Ankle / Brachial Index (ABI) - do not check if billed separately ?'[]'$  - 0 ?Vital Signs ?Has the patient been seen at the hospital within the last three years: Yes ?Total Score: 0 ?Level Of Care: ____ ?Electronic Signature(s) ?Signed: 01/15/2022 3:40:11 PM By: Carlene Coria RN ?Entered By: Carlene Coria on 01/14/2022 16:00:50 ?[name], [name] ([id number]) ?-------------------------------------------------------------------------------- ?Compression Therapy Details ?Patient Name: [name], [name] ?Date of Service: 01/14/2022 3:15 PM ?Medical Record Number: [id number] ?Patient Account Number: [id number] ?Date of Birth/Sex: [birth year] (76 y.o. F) ?Treating RN: Carlene Coria ?Primary Care Manraj Yeo: [name] Other  Clinician: ?Referring Jonni Oelkers: [name] ?Treating Kamree Wiens/Extender: [name] ?Weeks in Treatment: 51 ?Compression Therapy Performed for Wound Assessment: Wound #3 Left Lower Leg ?Performed By: Clinician Carlene Coria, RN ?Compression Type: Three Layer ?Post Procedure Diagnosis ?Same as Pre-procedure ?Electronic Signature(s) ?Signed: 01/15/2022 3:40:11 PM By: Carlene Coria RN ?Entered By: Carlene Coria on 01/14/2022 15:59:11 ?[name], [name] ([id number]) ?-------------------------------------------------------------------------------- ?Compression Therapy Details ?Patient Name: [name], [name] ?Date of Service: 01/14/2022 3:15 PM ?Medical Record Number: [id number] ?Patient Account Number: [id number] ?Date of Birth/Sex: 27-Sep-1945 (76 y.o. F) ?Treating RN: Carlene Coria ?Primary Care Asmar Brozek: [name] Other Clinician: ?Referring Catlin Doria: [name] ?Treating Kahmya Pinkham/Extender: [name] ?Weeks in Treatment: 51 ?Compression Therapy Performed for Wound Assessment: Wound #4 Right,Lateral Lower Leg ?Performed By: Clinician Carlene Coria, RN ?Compression Type: Four Layer ?Post Procedure Diagnosis ?Same as Pre-procedure ?Electronic Signature(s) ?Signed: 01/15/2022 3:40:11 PM By: Carlene Coria RN ?Entered By: Carlene Coria on 01/14/2022 15:59:28 ?[name], [name] ([id number]) ?-------------------------------------------------------------------------------- ?Encounter Discharge Information Details ?Patient Name: [name], [name] ?Date of Service: 01/14/2022 3:15 PM ?Medical Record Number: [id number] ?Patient Account Number: [id number] ?Date of Birth/Sex: 1946-02-25 (76 y.o. F) ?Treating RN: Carlene Coria ?Primary Care Maurice Fotheringham: [name] Other Clinician: ?Referring Quyen Cutsforth: [name] ?Treating Charmin Aguiniga/Extender: [name] ?Weeks in Treatment: 51 ?Encounter Discharge Information Items ?Discharge Condition: Stable ?Ambulatory Status: Wheelchair ?Discharge Destination: Home ?Transportation: Private Auto ?Accompanied  By: self ?Schedule Follow-up Appointment: Yes ?Clinical Summary of Care: Patient Declined ?Electronic Signature(s) ?Signed: 01/15/2022 3:40:11 PM By: Carlene Coria RN ?Entered By: Carlene Coria on 01/14/2022 16:02:4

## 2022-01-14 NOTE — Progress Notes (Addendum)
[place], [place] ([id number]) ?Visit Report for 01/14/2022 ?Chief Complaint Document Details ?Patient Name: [name], [name] ?Date of Service: 01/14/2022 3:15 PM ?Medical Record Number: [id number] ?Patient Account Number: [id number] ?Date of Birth/Sex: 1946/08/22 (76 y.o. F) ?Treating RN: Donnamarie Poag ?Primary Care Provider: [name] Other Clinician: ?Referring Provider: [name] ?Treating Provider/Extender: [name] ?Weeks in Treatment: 51 ?Information Obtained from: Patient ?Chief Complaint ?Bilateral LE Ulcers ?Electronic Signature(s) ?Signed: 01/14/2022 3:34:10 PM By: Worthy Keeler PA-C ?Entered By: Worthy Keeler on 01/14/2022 15:34:10 ?[name] ([id number]) ?-------------------------------------------------------------------------------- ?HPI Details ?Patient Name: [name], [name] ?Date of Service: 01/14/2022 3:15 PM ?Medical Record Number: [id number] ?Patient Account Number: [id number] ?Date of Birth/Sex: 12-Oct-1945 (75 y.o. F) ?Treating RN: Donnamarie Poag ?Primary Care Provider: [name] Other Clinician: ?Referring Provider: [name] ?Treating Provider/Extender: [name] ?Weeks in Treatment: 51 ?History of Present Illness ?HPI Description: 03/15/2020 upon evaluation today patient presents today for initial evaluation here in our clinic concerning a wound that is on ?the left posterior lower extremity. Unfortunately this has been giving the patient some discomfort at this point which she notes has been affected ?her pretty much daily. The patient does have a history of venous insufficiency, hypertension, congestive heart failure, and a history of breast ?cancer. Fortunately there does not appear to be evidence of active infection at this time which is great news. No fevers, chills, nausea, vomiting, ?or diarrhea. Wound is not extremely large but does have some slough covering the surface of the wound. This is going require sharp debridement ?today. ?03/15/2020 upon evaluation today patient  appears to be doing fairly well in regard to her wound. She does have some slough noted on the surface ?of the wound currently but I do believe the Iodoflex has been beneficial over the past week. There is no signs of active infection at this time which ?is great news and overall very pleased with the progress. No fevers, chills, nausea, vomiting, or diarrhea. ?04/02/2020 upon evaluation today patient appears to be doing a little better in regard to her wound size wise this is not tremendously smaller she ?does have some slough buildup on the surface of the wound. With that being said this is going require some sharp debridement today to clear ?away some of the necrotic debris. Fortunately there is no evidence of active infection at this time. No fevers, chills, nausea, vomiting, or diarrhea. ?04/10/2020 on evaluation today patient appears to be doing well with regard to her ulcer which is measuring somewhat smaller today. She actually ?has more granulation tissue noted which is also good news is no need for sharp debridement today. Fortunately there is no evidence of infection ?either which is also excellent. No fevers, chills, nausea, vomiting, or diarrhea. ?04/26/2020 on evaluation today patient's wound actually is showing signs of good improvement which is great news. There does not appear to be ?any evidence of active infection. I do believe she is tolerating the collagen at this point. ?05/03/2020 upon evaluation today patient's wound actually showed signs of good granulation at this time there does not appear to be any ?evidence of active infection which is great news and overall very pleased with where things stand. With that being said I do believe that the ?patient is can require some sharp debridement today but fortunately nothing too significant. ?05/11/20 on evaluation today patient appears to be doing well in regard to her leg ulcer. She's been tolerating the dressing changes without ?complication.  Fortunately there is no signs of active infection at this time.  Overall I'm very pleased with where things stand. ?05/18/2020 upon evaluation today patient's wound is showing signs of improvement is very dry and I think the collagen for the most part just ?dried to the wound bed. I am going to clean this away with sharp debridement in order to get this under control in my opinion. ?05/25/2020 upon evaluation today patient actually appears to be doing well in regard to her leg ulcer. In fact upon inspection it appears she could ?potentially be completely healed but that is not guaranteed based on what we are seeing. There does not appear to be any signs of active ?infection which is great news. ?05/31/2020 on evaluation today patient appears to be doing well with regard to her wounds. She in fact appears to be almost completely healed on ?the left leg there is just a very small opening remaining point 1 ?06/08/2020 upon evaluation today patient appears to be doing excellent in regard to her leg ulcer on the left in fact it appears to be that she is ?completely healed as of today. I see no signs of active infection at this time which is great news. No fevers, chills, nausea, vomiting, or diarrhea. ?READMISSION ?01/16/2021 ?Patient that we have had in this clinic with a wound on the left posterior calf in the summer 2021 extending into October. She was felt to have ?chronic venous insufficiency. Per the patient's description she was discharged with what sounds like external compression stockings although she ?could not afford the "$75 per leg". She therefore did not get anything. Apparently her wound that she has currently is been in existence since ?January. She has been followed at vein and vascular with Unna boots and I think calcium alginate. She had ABIs done on 06/25/2020 that showed ?an noncompressible ABI on the right leg and the left leg but triphasic waveforms with good great toe pressures and a TBI of 0.90 on the  right and ?0.91 on the left ?Surprisingly the patient also had venous reflux studies that were really unremarkable. This included no evidence of DVTs or SVTs but there was no ?evidence of deep venous insufficiency or superficial venous insufficiency in the greater saphenous or short saphenous veins bilaterally. I would ?wonder about more central venous issues or perhaps this is all lymphedema ?01/25/2021 upon evaluation today patient appears to be doing decently well and guard to her wound. The good news is the Iodoflex that is job she ?saw Dr. Dellia Nims last week for readmission and to be honest I think that this has done extremely well over that week. I do believe that she can ?tolerate the sharp debridement today which will be good. I think that cleaning off the wound will likely allow [place] to be able to use a different type ?of dressing I do not think the Iodoflex will even be necessary based on how clean the wound looks. Fortunately there is no sign of active infection ?at this time which is great news. No fevers, chills, nausea, vomiting, or diarrhea. ?02/12/2021 upon evaluation today patient appears to be doing well with regard to her leg ulcer. We did switch to alginate when she came for nurse ?visit I think is doing much better for her. Fortunately there does not appear to be any signs of active infection at this time which is great news. No ?fevers, chills, nausea, vomiting, or diarrhea. ?[place], [place] ([id number]) ?02/18/2021 upon evaluation today patient's wound is actually showing signs of good improvement. I am very pleased with where things stand ?  currently. I do not see any signs of active infection which is great news and overall I feel like she is making good progress. The patient likewise is ?happy that things are doing so well and that this is measuring somewhat smaller. ?02/25/2021 upon evaluation today patient actually appears to be doing quite well in regard to her wounds. She has been tolerating the  dressing ?changes without complication. Fortunately there does not appear to be any signs of active infection which is great news and overall I am ?extremely pleased with where things stand. No fevers, chills, nau

## 2022-01-21 ENCOUNTER — Encounter: Payer: Medicare Other | Admitting: Physician Assistant

## 2022-01-21 DIAGNOSIS — I890 Lymphedema, not elsewhere classified: Secondary | ICD-10-CM | POA: Diagnosis not present

## 2022-01-21 NOTE — Progress Notes (Signed)
[place], [place] ([id number]) ?Visit Report for 01/21/2022 ?Arrival Information Details ?Patient Name: [name], WILHIDE ?Date of Service: 01/21/2022 3:15 PM ?Medical Record Number: [id number] ?Patient Account Number: [id number] ?Date of Birth/Sex: 1946-04-28 (76 y.o. F) ?Treating RN: Donnamarie Poag ?Primary Care Nakeeta Sebastiani: [name] Other Clinician: ?Referring Renate Danh: [name] ?Treating Dejia Ebron/Extender: [name] ?Weeks in Treatment: 52 ?Visit Information History Since Last Visit ?Added or deleted any medications: No ?Patient Arrived: Wheel Chair ?Had a fall or experienced change in No ?Arrival Time: 15:35 ?activities of daily living that may affect ?Accompanied By: self ?risk of falls: ?Transfer Assistance: Easy[employer] Patient ?Hospitalized since last visit: No ?Lift ?Has Dressing in Place as Prescribed: Yes ?Patient Identification Verified: Yes ?Has Compression in Place as Prescribed: Yes ?Secondary Verification Process Completed: Yes ?Pain Present Now: No ?Patient Requires Transmission-Based No ?Precautions: ?Patient Has Alerts: Yes ?Patient Alerts: NOT DIABETIC ?Electronic Signature(s) ?Signed: 01/21/2022 4:34:09 PM By: Donnamarie Poag ?Entered ByDonnamarie Poag on 01/21/2022 15:45:16 ?[place], Caryl ([id number]) ?-------------------------------------------------------------------------------- ?Compression Therapy Details ?Patient Name: [name], HAMRIC ?Date of Service: 01/21/2022 3:15 PM ?Medical Record Number: [id number] ?Patient Account Number: [id number] ?Date of Birth/Sex: [birth year] (76 y.o. F) ?Treating RN: Donnamarie Poag ?Primary Care Franklin Baumbach: [name] Other Clinician: ?Referring Vaishali Baise: [name] ?Treating Donjuan Robison/Extender: [name] ?Weeks in Treatment: 52 ?Compression Therapy Performed for Wound Assessment: Wound #4 Right,Lateral Lower Leg ?Performed By: Clinician Donnamarie Poag, RN ?Compression Type: Three Layer ?Post Procedure Diagnosis ?Same as Pre-procedure ?Electronic Signature(s) ?Signed:  01/21/2022 4:34:09 PM By: Donnamarie Poag ?Entered ByDonnamarie Poag on 01/21/2022 15:50:44 ?[place], Nyana ([id number]) ?-------------------------------------------------------------------------------- ?Compression Therapy Details ?Patient Name: [name], RIDGELY ?Date of Service: 01/21/2022 3:15 PM ?Medical Record Number: [id number] ?Patient Account Number: [id number] ?Date of Birth/Sex: Jul 24, 1946 (76 y.o. F) ?Treating RN: Donnamarie Poag ?Primary Care Gabriellia Rempel: [name] Other Clinician: ?Referring Kimisha Eunice: [name] ?Treating Goodwin Kamphaus/Extender: [name] ?Weeks in Treatment: 52 ?Compression Therapy Performed for Wound Assessment: Wound #3 Left Lower Leg ?Performed By: Clinician Donnamarie Poag, RN ?Compression Type: Three Layer ?Post Procedure Diagnosis ?Same as Pre-procedure ?Electronic Signature(s) ?Signed: 01/21/2022 4:34:09 PM By: Donnamarie Poag ?Entered ByDonnamarie Poag on 01/21/2022 15:50:44 ?[place], Rosaleen ([id number]) ?-------------------------------------------------------------------------------- ?Encounter Discharge Information Details ?Patient Name: [name], LEWELLYN ?Date of Service: 01/21/2022 3:15 PM ?Medical Record Number: [id number] ?Patient Account Number: [id number] ?Date of Birth/Sex: 11-Apr-1946 (76 y.o. F) ?Treating RN: Donnamarie Poag ?Primary Care Jonelle Bann: [name] Other Clinician: ?Referring Thai Burgueno: [name] ?Treating Roc Streett/Extender: [name] ?Weeks in Treatment: 52 ?Encounter Discharge Information Items Post Procedure Vitals ?Discharge Condition: Stable ?Temperature (?F): 97.9 ?Ambulatory Status: Wheelchair ?Pulse (bpm): 52 ?Discharge Destination: Home ?Respiratory Rate (breaths/min): 16 ?Transportation: Other ?Blood Pressure (mmHg): 143/57 ?Accompanied By: self ?Schedule Follow-up Appointment: Yes ?Clinical Summary of Care: ?Electronic Signature(s) ?Signed: 01/21/2022 4:34:09 PM By: Donnamarie Poag ?Entered ByDonnamarie Poag on 01/21/2022 16:03:12 ?[place], Abir  ([id number]) ?-------------------------------------------------------------------------------- ?Lower Extremity Assessment Details ?Patient Name: [name], LISENBY ?Date of Service: 01/21/2022 3:15 PM ?Medical Record Number: [id number] ?Patient Account Number: [id number] ?Date of Birth/Sex: [birth year] (76 y.o. F) ?Treating RN: Donnamarie Poag ?Primary Care Tarika Mckethan: [name] Other Clinician: ?Referring Luree Palla: [name] ?Treating Fallon Haecker/Extender: [name] ?Weeks in Treatment: 52 ?Edema Assessment ?Assessed: [Left: Yes] [Right: Yes] ?Edema: [Left: Ye] [Right: s] ?Calf ?Left: Right: ?Point of Measurement: 31 cm From Medial Instep 39 cm 40.5 cm ?Ankle ?Left: Right: ?Point of Measurement: 10 cm From Medial Instep 24.2 cm 25.5 cm ?Vascular Assessment ?Pulses: ?Dorsalis Pedis ?Palpable: [Left:Yes] [Right:Yes] ?Electronic Signature(s) ?Signed: 01/21/2022 4:34:09 PM By: Donnamarie Poag ?Entered ByDonnamarie Poag on 01/21/2022 15:49:42 ?Wynetta Emery,  Harvey ([id number]) ?-------------------------------------------------------------------------------- ?Multi Wound Chart Details ?Patient Name: [name], PLACKE ?Date of Service: 01/21/2022 3:15 PM ?Medical Record Number: [id number] ?Patient Account Number: [id number] ?Date of Birth/Sex: May 12, 1946 (76 y.o. F) ?Treating RN: Donnamarie Poag ?Primary Care [place]: [name] Other Clinician: ?Referring Charmel Pronovost: [name] ?Treating Dencil Cayson/Extender: [name] ?Weeks in Treatment: 52 ?Vital Signs ?Height(in): 69 ?Pulse(bpm): 52 ?Weight(lbs): 244 ?Blood Pressure(mmHg): 143/57 ?Body Mass Index(BMI): 36 ?Temperature(??F): 97.9 ?Respiratory Rate(breaths/min): 16 ?Photos: [N/A:N/A] ?Wound Location: Left Lower Leg Right, Lateral Lower Leg N/A ?Wounding Event: Gradually Appeared Gradually Appeared N/A ?Primary Etiology: Lymphedema Venous Leg Ulcer N/A ?Comorbid History: Cataracts, Lymphedema, Sleep Cataracts, Lymphedema, Sleep N/A ?Apnea, Congestive Heart Failure, Apnea, Congestive Heart  Failure, ?Hypertension, Osteoarthritis, Hypertension, Osteoarthritis, ?Neuropathy, Received Neuropathy, Received ?Chemotherapy, Received Radiation Chemotherapy, Received Radiation ?Date Acquired: 08/15/2021 10/27/2021 N/A ?Weeks of Treatment: 20 12 N/A ?Wound Status: Open Open N/A ?Wound Recurrence: No No N/A ?Measurements L x W x D (cm) 0.3x0.3x0.1 3.7x4x0.1 N/A ?Area (cm?) : 0.071 11.624 N/A ?Volume (cm?) : 0.007 1.162 N/A ?% Reduction in Area: 99.90% -886.80% N/A ?% Reduction in Volume: 99.90% -884.70% N/A ?Classification: Full Thickness Without Exposed Full Thickness Without Exposed N/A ?Support Structures Support Structures ?Exudate Amount: Medium Medium N/A ?Exudate Type: Serosanguineous Serosanguineous N/A ?Exudate Color: red, brown red, brown N/A ?Granulation Amount: Large (67-100%) Large (67-100%) N/A ?Granulation Quality: Red, Hyper-granulation Red, Hyper-granulation N/A ?Necrotic Amount: Small (1-33%) Small (1-33%) N/A ?Exposed Structures: ?Fat Layer (Subcutaneous Tissue): ?Fat Layer (Subcutaneous Tissue): N/A ?Yes Yes ?Fascia: No ?Fascia: No ?Tendon: No ?Tendon: No ?Muscle: No ?Muscle: No ?Joint: No ?Joint: No ?Bone: No ?Bone: No ?Epithelialization: None None N/A ?Treatment Notes ?Electronic Signature(s) ?Signed: 01/21/2022 4:34:09 PM By: Donnamarie Poag ?Entered ByDonnamarie Poag on 01/21/2022 15:50:23 ?[place], [place] ([id number]) ?[place], Lamara ([id number]) ?-------------------------------------------------------------------------------- ?Multi-Disciplinary Care Plan Details ?Patient Name: [name], AGAR ?Date of Service: 01/21/2022 3:15 PM ?Medical Record Number: [id number] ?Patient Account Number: [id number] ?Date of Birth/Sex: 1946/01/29 (76 y.o. F) ?Treating RN: Donnamarie Poag ?Primary Care Maybel Dambrosio: [name] Other Clinician: ?Referring Ali Mclaurin: [name] ?Treating Jordi Kamm/Extender: [name] ?Weeks in Treatment: 52 ?Active Inactive ?Electronic Signature(s) ?Signed: 01/21/2022 4:34:09 PM By:  Donnamarie Poag ?Entered ByDonnamarie Poag on 01/21/2022 15:50:00 ?[place], Myliah ([id number]) ?-------------------------------------------------------------------------------- ?Pain Assessment Details ?Patient Name: GEORGIE, EDUARDO ?Date of

## 2022-01-21 NOTE — Progress Notes (Addendum)
[place], [place] ([id number]) ?Visit Report for 01/21/2022 ?Chief Complaint Document Details ?Patient Name: [name], [name] ?Date of Service: 01/21/2022 3:15 PM ?Medical Record Number: [id number] ?Patient Account Number: [id number] ?Date of Birth/Sex: [birth year] (76 y.o. F) ?Treating RN: Donnamarie Poag ?Primary Care Provider: [name] Other Clinician: ?Referring Provider: [name] ?Treating Provider/Extender: [name] ?Weeks in Treatment: 52 ?Information Obtained from: Patient ?Chief Complaint ?Bilateral LE Ulcers ?Electronic Signature(s) ?Signed: 01/21/2022 3:53:53 PM By: Worthy Keeler PA-C ?Entered By: Worthy Keeler on 01/21/2022 15:53:52 ?[place], Maisa ([id number]) ?-------------------------------------------------------------------------------- ?Debridement Details ?Patient Name: [name], [name] ?Date of Service: 01/21/2022 3:15 PM ?Medical Record Number: [id number] ?Patient Account Number: [id number] ?Date of Birth/Sex: 04-Sep-1946 (76 y.o. F) ?Treating RN: Donnamarie Poag ?Primary Care Provider: [name] Other Clinician: ?Referring Provider: [name] ?Treating Provider/Extender: [name] ?Weeks in Treatment: 52 ?Debridement Performed for ?Wound #4 Right,Lateral Lower Leg ?Assessment: ?Performed By: Physician Tommie Sams., PA-C ?Debridement Type: Debridement ?Severity of Tissue Pre Debridement: Fat layer exposed ?Level of Consciousness (Pre- ?Awake and Alert ?procedure): ?Pre-procedure Verification/Time Out ?Yes - 15:57 ?Taken: ?Start Time: 15:57 ?Pain Control: Lidocaine ?Total Area Debrided (L x W): 3.7 (cm) x 4 (cm) = 14.8 (cm?) ?Tissue and other material ?Viable, Non-Viable, Slough, Subcutaneous, Biofilm, Slough ?debrided: ?Level: Skin/Subcutaneous Tissue ?Debridement Description: Excisional ?Instrument: Curette ?Bleeding: Minimum ?Hemostasis Achieved: Pressure ?Response to Treatment: Procedure was tolerated well ?Level of Consciousness (Post- ?Awake and Alert ?procedure): ?Post Debridement  Measurements of Total Wound ?Length: (cm) 3.7 ?Width: (cm) 4 ?Depth: (cm) 0.1 ?Volume: (cm?) 1.162 ?Character of Wound/Ulcer Post Debridement: Improved ?Severity of Tissue Post Debridement: Fat layer exposed ?Post Procedure Diagnosis ?Same as Pre-procedure ?Electronic Signature(s) ?Signed: 01/21/2022 4:34:09 PM By: Donnamarie Poag ?Signed: 01/21/2022 4:58:37 PM By: Worthy Keeler PA-C ?Entered ByDonnamarie Poag on 01/21/2022 15:58:40 ?[name], [name] ([id number]) ?-------------------------------------------------------------------------------- ?HPI Details ?Patient Name: [name], [name] ?Date of Service: 01/21/2022 3:15 PM ?Medical Record Number: [id number] ?Patient Account Number: [id number] ?Date of Birth/Sex: [birth year] (76 y.o. F) ?Treating RN: Donnamarie Poag ?Primary Care Provider: [name] Other Clinician: ?Referring Provider: [name] ?Treating Provider/Extender: [name] ?Weeks in Treatment: 52 ?History of Present Illness ?HPI Description: 03/15/2020 upon evaluation today patient presents today for initial evaluation here in our clinic concerning a wound that is on ?the left posterior lower extremity. Unfortunately this has been giving the patient some discomfort at this point which she notes has been affected ?her pretty much daily. The patient does have a history of venous insufficiency, hypertension, congestive heart failure, and a history of breast ?cancer. Fortunately there does not appear to be evidence of active infection at this time which is great news. No fevers, chills, nausea, vomiting, ?or diarrhea. Wound is not extremely large but does have some slough covering the surface of the wound. This is going require sharp debridement ?today. ?03/15/2020 upon evaluation today patient appears to be doing fairly well in regard to her wound. She does have some slough noted on the surface ?of the wound currently but I do believe the Iodoflex has been beneficial over the past week. There is no signs of active  infection at this time which ?is great news and overall very pleased with the progress. No fevers, chills, nausea, vomiting, or diarrhea. ?04/02/2020 upon evaluation today patient appears to be doing a little better in regard to her wound size wise this is not tremendously smaller she ?does have some slough buildup on the surface of the wound. With that being said this is going require some sharp debridement today to  clear ?away some of the necrotic debris. Fortunately there is no evidence of active infection at this time. No fevers, chills, nausea, vomiting, or diarrhea. ?04/10/2020 on evaluation today patient appears to be doing well with regard to her ulcer which is measuring somewhat smaller today. She actually ?has more granulation tissue noted which is also good news is no need for sharp debridement today. Fortunately there is no evidence of infection ?either which is also excellent. No fevers, chills, nausea, vomiting, or diarrhea. ?04/26/2020 on evaluation today patient's wound actually is showing signs of good improvement which is great news. There does not appear to be ?any evidence of active infection. I do believe she is tolerating the collagen at this point. ?05/03/2020 upon evaluation today patient's wound actually showed signs of good granulation at this time there does not appear to be any ?evidence of active infection which is great news and overall very pleased with where things stand. With that being said I do believe that the ?patient is can require some sharp debridement today but fortunately nothing too significant. ?05/11/20 on evaluation today patient appears to be doing well in regard to her leg ulcer. She's been tolerating the dressing changes without ?complication. Fortunately there is no signs of active infection at this time. Overall I'm very pleased with where things stand. ?05/18/2020 upon evaluation today patient's wound is showing signs of improvement is very dry and I think the collagen for  the most part just ?dried to the wound bed. I am going to clean this away with sharp debridement in order to get this under control in my opinion. ?05/25/2020 upon evaluation today patient actually appears to be doing well in regard to her leg ulcer. In fact upon inspection it appears she could ?potentially be completely healed but that is not guaranteed based on what we are seeing. There does not appear to be any signs of active ?infection which is great news. ?05/31/2020 on evaluation today patient appears to be doing well with regard to her wounds. She in fact appears to be almost completely healed on ?the left leg there is just a very small opening remaining point 1 ?06/08/2020 upon evaluation today patient appears to be doing excellent in regard to her leg ulcer on the left in fact it appears to be that she is ?completely healed as of today. I see no signs of active infection at this time which is great news. No fevers, chills, nausea, vomiting, or diarrhea. ?READMISSION ?01/16/2021 ?Patient that we have had in this clinic with a wound on the left posterior calf in the summer 2021 extending into October. She was felt to have ?chronic venous insufficiency. Per the patient's description she was discharged with what sounds like external compression stockings although she ?could not afford the "$75 per leg". She therefore did not get anything. Apparently her wound that she has currently is been in existence since ?January. She has been followed at vein and vascular with Unna boots and I think calcium alginate. She had ABIs done on 06/25/2020 that showed ?an noncompressible ABI on the right leg and the left leg but triphasic waveforms with good great toe pressures and a TBI of 0.90 on the right and ?0.91 on the left ?Surprisingly the patient also had venous reflux studies that were really unremarkable. This included no evidence of DVTs or SVTs but there was no ?evidence of deep venous insufficiency or superficial venous  insufficiency in the greater saphenous or short saphenous veins bilaterally. I would ?wonder  about more central venous issues or perhaps this is all lymphedema ?01/25/2021 upon evaluation today patient

## 2022-01-28 ENCOUNTER — Encounter: Payer: Medicare Other | Admitting: Physician Assistant

## 2022-01-28 DIAGNOSIS — I890 Lymphedema, not elsewhere classified: Secondary | ICD-10-CM | POA: Diagnosis not present

## 2022-01-28 NOTE — Progress Notes (Addendum)
[name] ([id number]) Visit Report for 01/28/2022 Chief Complaint Document Details Patient Name: [name], [name] Date of Service: 01/28/2022 3:30 PM Medical Record Number: [id number] Patient Account Number: [id number] Date of Birth/Sex: 1946-05-07 (76 y.o. F) Treating RN: Carlene Coria Primary Care Provider: [name] Other Clinician: Referring Provider: [name] Treating Provider/Extender: Skipper Cliche in Treatment: 18 Information Obtained from: Patient Chief Complaint Bilateral LE Ulcers Electronic Signature(s) Signed: 01/28/2022 3:49:31 PM By: Worthy Keeler PA-C Entered By: Worthy Keeler on 01/28/2022 15:49:31 [name] ([id number]) -------------------------------------------------------------------------------- HPI Details Patient Name: [name] Date of Service: 01/28/2022 3:30 PM Medical Record Number: [id number] Patient Account Number: [id number] Date of Birth/Sex: March 05, 1946 (76 y.o. F) Treating RN: Carlene Coria Primary Care Provider: [name] Other Clinician: Referring Provider: [name] Treating Provider/Extender: Skipper Cliche in Treatment: 69 History of Present Illness HPI Description: 03/15/2020 upon evaluation today patient presents today for initial evaluation here in our clinic concerning a wound that is on the left posterior lower extremity. Unfortunately this has been giving the patient some discomfort at this point which she notes has been affected her pretty much daily. The patient does have a history of venous insufficiency, hypertension, congestive heart failure, and a history of breast cancer. Fortunately there does not appear to be evidence of active infection at this time which is great news. No fevers, chills, nausea, vomiting, or diarrhea. Wound is not extremely large but does have some slough covering the surface of the wound. This is going require sharp debridement today. 03/15/2020 upon evaluation today patient  appears to be doing fairly well in regard to her wound. She does have some slough noted on the surface of the wound currently but I do believe the Iodoflex has been beneficial over the past week. There is no signs of active infection at this time which is great news and overall very pleased with the progress. No fevers, chills, nausea, vomiting, or diarrhea. 04/02/2020 upon evaluation today patient appears to be doing a little better in regard to her wound size wise this is not tremendously smaller she does have some slough buildup on the surface of the wound. With that being said this is going require some sharp debridement today to clear away some of the necrotic debris. Fortunately there is no evidence of active infection at this time. No fevers, chills, nausea, vomiting, or diarrhea. 04/10/2020 on evaluation today patient appears to be doing well with regard to her ulcer which is measuring somewhat smaller today. She actually has more granulation tissue noted which is also good news is no need for sharp debridement today. Fortunately there is no evidence of infection either which is also excellent. No fevers, chills, nausea, vomiting, or diarrhea. 04/26/2020 on evaluation today patient's wound actually is showing signs of good improvement which is great news. There does not appear to be any evidence of active infection. I do believe she is tolerating the collagen at this point. 05/03/2020 upon evaluation today patient's wound actually showed signs of good granulation at this time there does not appear to be any evidence of active infection which is great news and overall very pleased with where things stand. With that being said I do believe that the patient is can require some sharp debridement today but fortunately nothing too significant. 05/11/20 on evaluation today patient appears to be doing well in regard to her leg ulcer. She's been tolerating the dressing changes without complication.  Fortunately there is no signs of active infection at this time.  Overall I'm very pleased with where things stand. 05/18/2020 upon evaluation today patient's wound is showing signs of improvement is very dry and I think the collagen for the most part just dried to the wound bed. I am going to clean this away with sharp debridement in order to get this under control in my opinion. 05/25/2020 upon evaluation today patient actually appears to be doing well in regard to her leg ulcer. In fact upon inspection it appears she could potentially be completely healed but that is not guaranteed based on what we are seeing. There does not appear to be any signs of active infection which is great news. 05/31/2020 on evaluation today patient appears to be doing well with regard to her wounds. She in fact appears to be almost completely healed on the left leg there is just a very small opening remaining point 1 06/08/2020 upon evaluation today patient appears to be doing excellent in regard to her leg ulcer on the left in fact it appears to be that she is completely healed as of today. I see no signs of active infection at this time which is great news. No fevers, chills, nausea, vomiting, or diarrhea. READMISSION 01/16/2021 Patient that we have had in this clinic with a wound on the left posterior calf in the summer 2021 extending into October. She was felt to have chronic venous insufficiency. Per the patient's description she was discharged with what sounds like external compression stockings although she could not afford the "$75 per leg". She therefore did not get anything. Apparently her wound that she has currently is been in existence since January. She has been followed at vein and vascular with Unna boots and I think calcium alginate. She had ABIs done on 06/25/2020 that showed an noncompressible ABI on the right leg and the left leg but triphasic waveforms with good great toe pressures and a TBI of 0.90 on the  right and 0.91 on the left Surprisingly the patient also had venous reflux studies that were really unremarkable. This included no evidence of DVTs or SVTs but there was no evidence of deep venous insufficiency or superficial venous insufficiency in the greater saphenous or short saphenous veins bilaterally. I would wonder about more central venous issues or perhaps this is all lymphedema 01/25/2021 upon evaluation today patient appears to be doing decently well and guard to her wound. The good news is the Iodoflex that is job she saw Dr. Dellia Nims last week for readmission and to be honest I think that this has done extremely well over that week. I do believe that she can tolerate the sharp debridement today which will be good. I think that cleaning off the wound will likely allow [place] to be able to use a different type of dressing I do not think the Iodoflex will even be necessary based on how clean the wound looks. Fortunately there is no sign of active infection at this time which is great news. No fevers, chills, nausea, vomiting, or diarrhea. 02/12/2021 upon evaluation today patient appears to be doing well with regard to her leg ulcer. We did switch to alginate when she came for nurse visit I think is doing much better for her. Fortunately there does not appear to be any signs of active infection at this time which is great news. No fevers, chills, nausea, vomiting, or diarrhea. [name], [name] ([id number]) 02/18/2021 upon evaluation today patient's wound is actually showing signs of good improvement. I am very pleased with where things stand  currently. I do not see any signs of active infection which is great news and overall I feel like she is making good progress. The patient likewise is happy that things are doing so well and that this is measuring somewhat smaller. 02/25/2021 upon evaluation today patient actually appears to be doing quite well in regard to her wounds. She has been tolerating the  dressing changes without complication. Fortunately there does not appear to be any signs of active infection which is great news and overall I am extremely pleased with where things stand. No fevers, chills, nausea, vomiting, or diarrhea. She does have a lot of edema I really do think she needs compression socks to be worn in order to prevent things from causing additional issues for her currently. Especially on the right leg she should be wearing compression socks all the time to be honest. 03/04/2021 upon evaluation today patient's leg ulcer actually appears to be doing pretty well which is great news. There does not appear to be any significant change but overall the appearance is better even though the size is not necessarily reflecting a great improvement. Fortunately there does not appear to be any signs of active infection. No fevers, chills, nausea, vomiting, or diarrhea. 03/12/2021 upon evaluation today patient appears to be doing well with regard to her wounds. She has been tolerating the dressing changes without complication. The good news is that the wound on her left lateral leg is doing much better and is showing signs of good improvement. Overall her swelling is controlled well as well. She does need some compression socks she plans to go Jefferey socks next week. 03/18/2021 on evaluation today patient's wound actually is showing signs of good improvement. She does have a little bit of slough this can require some sharp debridement today. 03/25/2021 upon evaluation today patient appears to be doing well with regard to her wound on the left lateral leg. She has been tolerating the dressing changes without complication. Fortunately there does not appear to be any signs of active infection at this time. No fever chills no 04/01/2021 upon evaluation today patient's wound is showing signs of improvement and overall very pleased with where things stand at this point. There is no evidence of active  infection which is great news as well and in general I think that she is making great progress. 04/09/2021 upon evaluation today patient appears to be doing well with regard to her ankle ulcer. She is making good progress currently which is great news. There does not appear to be any signs of infection also excellent news. In general I am extremely pleased with where things stand today. No fevers, chills, nausea, vomiting, or diarrhea. 04/23/2021 upon inspection today patient appears to be doing quite well with regard to her leg ulcer. She has been tolerating the dressing changes without complication. She is can require some sharp debridement today but overall seems to be doing excellent. 05/06/2021 upon evaluation today patient unfortunately appears to be doing poorly overall in regard to her swelling. She is actually significantly more swollen than last week despite the fact that her wound is doing better this is not a good trend. I think that she probably needs to see her cardiologist ASAP and I discussed that with her today. This includes the fact that honestly I think she probably is getting need an increase in her diuretic or something to try to clear away some of the excess fluid that she is experiencing at the moment. She is  not been doing her pumps even twice a day but again right now more concerned that if she were to do the pump she would fluid overload her lungs causing other issues as far as her breathing is concerned. Obviously I think she needs to contact them and move up the appointment for the 12th of something sooner 2 weeks is a bit too long to wait with how swollen she has. 05/14/2021 upon evaluation today patient's wound actually showing signs of being a little bit smaller compared to previous. She has been making good progress however. Fortunately there does not appear to be any evidence of active infection which is great. Overall I am extremely pleased in that regard. Nonetheless I am  still concerned that she is having quite a bit of issue as far as the swelling is concerned she has been placed on fluid restriction as well as an increase in the furosemide by her doctor. We will see how things progress. 05/21/2021 upon evaluation today patient appears to be doing well with regard to her wound in general. She has been tolerating the dressing changes without complication. With that being said she has been using silver cell for a while and while this was showing signs of improvement it is now gotten to the point where this has slowed down quite significantly. I do believe that switching to a different dressing may be beneficial for her today. 05/28/2021 upon evaluation today patient actually appears to be doing quite well in regard to her wound. In fact there is really not any need for sharp debridement today based on what I see. The Hydrofera Blue is doing great and overall I think that she is managing quite nicely with the compression wrapping. Her edema is still down quite a bit with the wrapping in place. 06/11/2021 upon evaluation today patient appears to be doing well with regard to her leg ulcer. She has been tolerating the dressing changes without complication. Fortunately there does not appear to be any signs of active infection at this time which is great news. No fevers, chills, nausea, vomiting, or diarrhea. She is going require some sharp debridement today. 10/11; left leg ulcer much better looking and with improved surface area. She is using Hydrofera Blue under 3 layer compression. She does not have any stocking on the right leg which in itself has very significant nonpitting edema 06/25/2021 upon evaluation today patient appears to be doing well with regard to the wound on her left leg. She has been tolerating the dressing changes without complication and overall I am extremely pleased with where things stand today. I do not see any evidence of infection which is great  news. 07/02/2021 upon evaluation today patient's wound actually showing signs of excellent improvement and actually very pleased with where we stand today and the overall appearance. Fortunately there does not appear to be any signs of active infection. No fevers, chills, nausea, vomiting, or diarrhea. 07/09/2021 upon evaluation today patient appears to be doing better in regard to her wound this is measuring smaller and she is headed in the appropriate direction based on what I am seeing currently. I do not see any evidence of active infection systemically which is great news. 07/16/2021 upon evaluation today patient appears to be doing pretty well currently in regard to her leg ulcer. This is measuring smaller and looking much better this is great news. Fortunately there does not appear to be any evidence of active infection systemically which is great news as well.  07/23/2021 upon evaluation today patient's wound is actually showing signs of good improvement this is definitely measuring smaller. I do not see any signs of infection which is great news and overall very pleased in that regard. Overall I think that she is doing well with the Jahnay Lantier, [place] ([id number]) [place]. 07/30/2020 upon evaluation today patient appears to be doing well in regard to her wound. She has been tolerating the dressing changes without complication think the Hydrofera Blue at this point is actually causing her to dry out a lot as far as the wound bed is concerned. Subsequently I think that we need to try to see what we can do to improve this. Fortunately there does not appear to be any evidence of active infection at this time which is great news. 08/06/2021 upon evaluation today patient appears to be doing well with regards to her wound. Is not measuring significantly smaller initial inspection but upon closer inspection she has a lot of new skin growth across the central portion of the wound. I think will  get very close to complete resolution. 08/13/2021 upon evaluation today patient appears to be doing well with regard to her wound. In fact this appears to be I believe healed although there is still some question as to whether it is completely so. I am concerned about the fact that to be honest she still has some evidence of dry skin that could be just trapping something underneath I still want to debride anything as I am afraid of causing some damage to the good skin for that reason I Georgina Peer probably monitor for 1 more week before completely closing everything out. 08/20/2021 upon evaluation today patient actually appears to be doing excellent. In fact she appears to be completely healed. This was the case last week as well we just wanted to make sure nothing reopened since she does not really have an ability to put on compression socks this is good to be something we need to make sure was well-healed. Nonetheless I think we have achieved that goal as of today. 12/27; this is a patient we discharged 2 weeks ago with wounds on her left lower leg laterally. She was supposed to get stockings and really did not get them. She developed blistering and reopening probably sometime late last week. She has 3 areas in the same area as previously. The mid calf dimensions on the left went from 39 to 51 cm today. She also has very significant edema on the right leg but as far as I am aware has not had wounds in this area 1/4; patient presents for follow-up. She has tolerated the compression wrap well. She has no issues or complaints today. 09/17/2021 upon evaluation today patient appears to be doing well with regard to her wounds. She has been tolerating the dressing changes without complication. Fortunately there does not appear to be any signs of active infection at this time. No fevers, chills, nausea, vomiting, or diarrhea. 10/01/2021 upon evaluation today patient appears to be doing well with regard to her  wound this is actually measuring better and looking smaller and I am very pleased in that regard. Fortunately I do not see any evidence of active infection locally nor systemically at this point. No fevers, chills, nausea, vomiting, or diarrhea. 10/08/2021 upon evaluation patient's wounds are actually showing some signs of improvement measuring a little bit smaller and looking a little bit better. Overall I do not think there is any need for sharp debridement today which  is good news. No fevers, chills, nausea, vomiting, or diarrhea. 10/14/2021 upon evaluation today patient appears to be doing well with regard to her wound. She has been tolerating the dressing changes without complication and overall I am extremely pleased with where things stand today. There is a little bit of film on the surface of the wounds but this was carefully cleaned away with just saline and gauze she really did not want me to do any sharp debridement today. 10/29/2021 upon evaluation today patient appears to be doing quite well in regard to her wounds. She is actually showing signs of excellent improvement which is great news and overall I feel like we are on the right track at this time. She does have a wound on both legs and she does need something compression wise when she heals for that reason we will get a go and see about ordering her bilateral juxta lite compression wraps. 11/05/2021 upon evaluation today patient appears to be doing well with regard to her wound. She has been tolerating the dressing changes without complication. Fortunately I do not see any evidence of active infection locally nor systemically at this time which is great news. No fevers, chills, nausea, vomiting, or diarrhea. 11/12/2021 upon evaluation today patient appears to be doing well with regard to her wounds. She is doing excellent on the left on the right this is not doing quite as well there is some slough and biofilm buildup. 3/14; patient  presents for follow-up. She has no issues or complaints today. She has tolerated the compression wraps well. 11/26/2021 upon evaluation today patient appears to be doing well with regard to her wounds. Both are showing signs of good improvement which is great news. I do not see any evidence of active infection and overall think she is healing quite nicely. 12/03/2021 upon evaluation today patient appears to be doing well with regard to her wounds. Both are showing signs of excellent improvement I am very pleased with where things stand and I think that she is making good progress here. Fortunately I do not see any signs of active infection locally or systemically which is great news. No fevers, chills, nausea, vomiting, or diarrhea. 12-10-2021 upon evaluation today patient's wounds actually appear to be doing awesome. I am extremely pleased with where we stand I think she is making excellent progress. I do not see any signs of active infection locally or systemically at this time. 12-17-2021 upon evaluation today patient's wounds are actually showing signs of good improvement. Fortunately I do not see any evidence of active infection locally nor systemically which is great news. Overall I do not believe that she is showing any signs of significant worsening which is good news. With that being said I do believe that she has a little bit of film on the surface of the wound I was actually able to clear this away with saline and gauze no sharp debridement was necessary. 12-24-2021 upon evaluation today patient appears to be doing well with regard to her wounds. I do not see any signs of active infection currently which is great news and overall I think that we are on the right track here. No fevers, chills, nausea, vomiting, or diarrhea. 12-31-2021 upon evaluation today patient appears to be doing well with regard to her wounds. Both are showing signs of improvement the wound on the right leg is going require  some sharp debridement on the left seems to be doing quite well. 01-07-2022 upon evaluation today patient  appears to be doing well currently in regard to her wounds. She is actually making good progress bilaterally. Both of them require little bit of sharp debridement but not too much which is great news. [place], [place] ([id number]) Upon inspection patient's wound bed actually showed signs of good granulation and epithelization on both legs. She still is having significant issues with the right leg compared to the left although I do feel like both are actually showing signs of pretty good improvement. 01-21-2022 upon evaluation today patient's wound is actually showing signs of excellent improvement I am very pleased with where we stand currently. I do not see any signs of active infection locally or systemically which is great news. No fevers, chills, nausea, vomiting, or diarrhea. 01-28-2022 upon evaluation today patient appears to be doing well with regard to her wound on the left leg this is pretty much just about healed which is great news. Unfortunately in regard to the wound on her right leg this is not doing nearly as well in fact I think we probably need to see about a culture today that was discussed with her. Electronic Signature(s) Signed: 01/29/2022 8:30:10 AM By: Worthy Keeler PA-C Entered By: Worthy Keeler on 01/29/2022 08:30:09 [name], [name] ([id number]) -------------------------------------------------------------------------------- Physical Exam Details Patient Name: [name] Date of Service: 01/28/2022 3:30 PM Medical Record Number: [id number] Patient Account Number: [id number] Date of Birth/Sex: [birth year] (76 y.o. F) Treating RN: Carlene Coria Primary Care Provider: [name] Other Clinician: Referring Provider: [name] Treating Provider/Extender: Skipper Cliche in Treatment: 84 Constitutional Well-nourished and well-hydrated in no acute  distress. Respiratory normal breathing without difficulty. Psychiatric this patient is able to make decisions and demonstrates good insight into disease process. Alert and Oriented x 3. pleasant and cooperative. Notes Upon inspection patient's wound bed actually showed signs of good granulation and epithelization at this point. Fortunately I do not see any signs of active infection locally or systemically which is great news and overall I am very pleased with where we stand. Electronic Signature(s) Signed: 01/29/2022 8:30:25 AM By: Worthy Keeler PA-C Entered By: Worthy Keeler on 01/29/2022 08:30:25 KEYSHLA, TUN[place] ([id number]) -------------------------------------------------------------------------------- Physician Orders Details Patient Name: [name] Date of Service: 01/28/2022 3:30 PM Medical Record Number: [id number] Patient Account Number: [id number] Date of Birth/Sex: September 11, 1945 (76 y.o. F) Treating RN: Carlene Coria Primary Care Provider: [name] Other Clinician: Referring Provider: [name] Treating Provider/Extender: Skipper Cliche in Treatment: 64 Verbal / Phone Orders: No Diagnosis Coding Follow-up Appointments o Return Appointment in 1 week. o Nurse Visit as needed Bathing/ Shower/ Hygiene o May shower with wound dressing protected with water repellent cover or cast protector. o No tub bath. Anesthetic (Use 'Patient Medications' Section for Anesthetic Order Entry) o Lidocaine applied to wound bed Edema Control - Lymphedema / Segmental Compressive Device / Other Bilateral Lower Extremities o Optional: One layer of unna paste to top of compression wrap (to act as an anchor). - she needs this o Elevate, Exercise Daily and Avoid Standing for Long Periods of Time. o Elevate legs to the level of the heart and pump ankles as often as possible o Elevate leg(s) parallel to the floor when sitting. o Compression Pump: Use compression  pump on left lower extremity for 60 minutes, twice daily. - You may pump over wraps o Compression Pump: Use compression pump on right lower extremity for 60 minutes, twice daily. - You may pump over wraps Additional Orders / Instructions o Follow Nutritious Diet and  Increase Protein Intake Wound Treatment Wound #3 - Lower Leg Wound Laterality: Left Cleanser: Soap and Water 1 x Per Week/30 Days Discharge Instructions: Gently cleanse wound with antibacterial soap, rinse and pat dry prior to dressing wounds Cleanser: Wound Cleanser 1 x Per Week/30 Days Discharge Instructions: Wash your hands with soap and water. Remove old dressing, discard into plastic bag and place into trash. Cleanse the wound with Wound Cleanser prior to applying a clean dressing using gauze sponges, not tissues or cotton balls. Do not scrub or use excessive force. Pat dry using gauze sponges, not tissue or cotton balls. Primary Dressing: Silvercel Small 2x2 (in/in) 1 x Per Week/30 Days Discharge Instructions: Apply Silvercel Small 2x2 (in/in) as instructed Secondary Dressing: ABD Pad 5x9 (in/in) 1 x Per Week/30 Days Discharge Instructions: Cover with ABD pad Compression Wrap: 3-LAYER WRAP - Profore Lite LF 3 Multilayer Compression Bandaging System 1 x Per Week/30 Days Discharge Instructions: Apply 3 multi-layer wrap as prescribed. Compression Stockings: Circaid Juxta Lite Compression Wrap Left Leg Compression Amount: 30-40 mmHG Discharge Instructions: Apply Circaid Juxta Lite Compression Wrap as directed Wound #4 - Lower Leg Wound Laterality: Right, Lateral Cleanser: Soap and Water 1 x Per Week/30 Days Discharge Instructions: Gently cleanse wound with antibacterial soap, rinse and pat dry prior to dressing wounds Cleanser: Wound Cleanser 1 x Per Week/30 Days Discharge Instructions: Wash your hands with soap and water. Remove old dressing, discard into plastic bag and place into trash. Cleanse the wound with Wound  Cleanser prior to applying a clean dressing using gauze sponges, not tissues or cotton balls. Do not scrub or use excessive force. Pat dry using gauze sponges, not tissue or cotton balls. Peri-Wound Care: Desitin Maximum Strength Ointment 4 (oz) 1 x Per Week/30 Days [name], [name] ([id number]) Discharge Instructions: Apply around wound edges to avoid maceration Primary Dressing: Silvercel Small 2x2 (in/in) 1 x Per Week/30 Days Discharge Instructions: Apply Silvercel Small 2x2 (in/in) as instructed Secondary Dressing: ABD Pad 5x9 (in/in) 1 x Per Week/30 Days Discharge Instructions: Cover with ABD pad Compression Wrap: 3-LAYER WRAP - Profore Lite LF 3 Multilayer Compression Bandaging System 1 x Per Week/30 Days Discharge Instructions: Apply 3 multi-layer wrap as prescribed. Compression Stockings: Circaid Juxta Lite Compression Wrap Right Leg Compression Amount: 20-30 mmHG Discharge Instructions: Apply Circaid Juxta Lite Compression Wrap as directed Laboratory o Bacteria identified in Wound by Culture (MICRO) - non healing wound oooo LOINC Code: 0623-7 oooo Convenience Name: Wound culture routine Patient Medications Allergies: No Known Drug Allergies Notifications Medication Indication Start End Bactrim DS 01/28/2022 DOSE 1 - oral 800 mg-160 mg tablet - 1 tablet oral taken 2 times per day for 14 days. Do not take potassium while on this medication Electronic Signature(s) Signed: 02/19/2022 8:45:50 AM By: Worthy Keeler PA-C Signed: 05/26/2022 2:33:39 PM By: Carlene Coria RN Previous Signature: 01/28/2022 4:08:35 PM Version By: Worthy Keeler PA-C Entered By: Carlene Coria on 02/10/2022 13:54:52 [name] ([id number]) -------------------------------------------------------------------------------- Problem List Details Patient Name: [name] Date of Service: 01/28/2022 3:30 PM Medical Record Number: [id number] Patient Account Number: [id number] Date of Birth/Sex: Mar 24, 1946  (76 y.o. 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 hypertension (idiopathic) with ulcer and inflammation of 01/16/2021 No Yes left lower extremity L97.822 Non-pressure chronic ulcer of other part  of left lower leg with fat layer 01/16/2021 No Yes exposed L97.812 Non-pressure chronic ulcer of other part of right lower leg with fat layer 01/07/2022 No Yes exposed Inactive Problems Resolved Problems Electronic Signature(s) Signed: 01/28/2022 3:49:28 PM By: Worthy Keeler PA-C Entered By: Worthy Keeler on 01/28/2022 15:49:28 [name] ([id number]) -------------------------------------------------------------------------------- Progress Note Details Patient Name: [name] Date of Service: 01/28/2022 3:30 PM Medical Record Number: [id number] Patient Account Number: [id number] Date of Birth/Sex: 1946/02/16 (76 y.o. F) Treating RN: Carlene Coria Primary Care Provider: [name] Other Clinician: Referring Provider: [name] Treating Provider/Extender: Skipper Cliche in Treatment: 53 Subjective Chief Complaint Information obtained from Patient Bilateral LE Ulcers History of Present Illness (HPI) 03/15/2020 upon evaluation today patient presents today for initial evaluation here in our clinic concerning a wound that is on the left posterior lower extremity. Unfortunately this has been giving the patient some discomfort at this point which she notes has been affected her pretty much daily. The patient does have a history of venous insufficiency, hypertension, congestive heart failure, and a history of breast cancer. Fortunately there does not appear to be evidence of active  infection at this time which is great news. No fevers, chills, nausea, vomiting, or diarrhea. Wound is not extremely large but does have some slough covering the surface of the wound. This is going require sharp debridement today. 03/15/2020 upon evaluation today patient appears to be doing fairly well in regard to her wound. She does have some slough noted on the surface of the wound currently but I do believe the Iodoflex has been beneficial over the past week. There is no signs of active infection at this time which is great news and overall very pleased with the progress. No fevers, chills, nausea, vomiting, or diarrhea. 04/02/2020 upon evaluation today patient appears to be doing a little better in regard to her wound size wise this is not tremendously smaller she does have some slough buildup on the surface of the wound. With that being said this is going require some sharp debridement today to clear away some of the necrotic debris. Fortunately there is no evidence of active infection at this time. No fevers, chills, nausea, vomiting, or diarrhea. 04/10/2020 on evaluation today patient appears to be doing well with regard to her ulcer which is measuring somewhat smaller today. She actually has more granulation tissue noted which is also good news is no need for sharp debridement today. Fortunately there is no evidence of infection either which is also excellent. No fevers, chills, nausea, vomiting, or diarrhea. 04/26/2020 on evaluation today patient's wound actually is showing signs of good improvement which is great news. There does not appear to be any evidence of active infection. I do believe she is tolerating the collagen at this point. 05/03/2020 upon evaluation today patient's wound actually showed signs of good granulation at this time there does not appear to be any evidence of active infection which is great news and overall very pleased with where things stand. With that being said I do  believe that the patient is can require some sharp debridement today but fortunately nothing too significant. 05/11/20 on evaluation today patient appears to be doing well in regard to her leg ulcer. She's been tolerating the dressing changes without complication. Fortunately there is no signs of active infection at this time. Overall I'm very pleased with where things stand. 05/18/2020 upon evaluation today patient's wound is showing signs of improvement is very dry and I think  the collagen for the most part just dried to the wound bed. I am going to clean this away with sharp debridement in order to get this under control in my opinion. 05/25/2020 upon evaluation today patient actually appears to be doing well in regard to her leg ulcer. In fact upon inspection it appears she could potentially be completely healed but that is not guaranteed based on what we are seeing. There does not appear to be any signs of active infection which is great news. 05/31/2020 on evaluation today patient appears to be doing well with regard to her wounds. She in fact appears to be almost completely healed on the left leg there is just a very small opening remaining point 1 06/08/2020 upon evaluation today patient appears to be doing excellent in regard to her leg ulcer on the left in fact it appears to be that she is completely healed as of today. I see no signs of active infection at this time which is great news. No fevers, chills, nausea, vomiting, or diarrhea. READMISSION 01/16/2021 Patient that we have had in this clinic with a wound on the left posterior calf in the summer 2021 extending into October. She was felt to have chronic venous insufficiency. Per the patient's description she was discharged with what sounds like external compression stockings although she could not afford the "$75 per leg". She therefore did not get anything. Apparently her wound that she has currently is been in existence since January. She  has been followed at vein [facility] vascular with Unna boots and I think calcium alginate. She had ABIs done on 06/25/2020 that showed an noncompressible ABI on the right leg and the left leg but triphasic waveforms with good great toe pressures and a TBI of 0.90 on the right and 0.91 on the left Surprisingly the patient also had venous reflux studies that were really unremarkable. This included no evidence of DVTs or SVTs but there was no evidence of deep venous insufficiency or superficial venous insufficiency in the greater saphenous or short saphenous veins bilaterally. I would wonder about more central venous issues or perhaps this is all lymphedema 01/25/2021 upon evaluation today patient appears to be doing decently well and guard to her wound. The good news is the Iodoflex that is job she saw Dr. Dellia Nims last week for readmission and to be honest I think that this has done extremely well over that week. I do believe that she can tolerate the sharp debridement today which will be good. I think that cleaning off the wound will likely allow [place] to be able to use a different type of dressing I do not think the Iodoflex will even be necessary based on how clean the wound looks. Fortunately there is no sign of active infection at this time which is great news. No fevers, chills, nausea, vomiting, or diarrhea. [name], [name] ([id number]) 02/12/2021 upon evaluation today patient appears to be doing well with regard to her leg ulcer. We did switch to alginate when she came for nurse visit I think is doing much better for her. Fortunately there does not appear to be any signs of active infection at this time which is great news. No fevers, chills, nausea, vomiting, or diarrhea. 02/18/2021 upon evaluation today patient's wound is actually showing signs of good improvement. I am very pleased with where things stand currently. I do not see any signs of active infection which is great news and overall I feel like  she is making good progress.  The patient likewise is happy that things are doing so well and that this is measuring somewhat smaller. 02/25/2021 upon evaluation today patient actually appears to be doing quite well in regard to her wounds. She has been tolerating the dressing changes without complication. Fortunately there does not appear to be any signs of active infection which is great news and overall I am extremely pleased with where things stand. No fevers, chills, nausea, vomiting, or diarrhea. She does have a lot of edema I really do think she needs compression socks to be worn in order to prevent things from causing additional issues for her currently. Especially on the right leg she should be wearing compression socks all the time to be honest. 03/04/2021 upon evaluation today patient's leg ulcer actually appears to be doing pretty well which is great news. There does not appear to be any significant change but overall the appearance is better even though the size is not necessarily reflecting a great improvement. Fortunately there does not appear to be any signs of active infection. No fevers, chills, nausea, vomiting, or diarrhea. 03/12/2021 upon evaluation today patient appears to be doing well with regard to her wounds. She has been tolerating the dressing changes without complication. The good news is that the wound on her left lateral leg is doing much better and is showing signs of good improvement. Overall her swelling is controlled well as well. She does need some compression socks she plans to go Jefferey socks next week. 03/18/2021 on evaluation today patient's wound actually is showing signs of good improvement. She does have a little bit of slough this can require some sharp debridement today. 03/25/2021 upon evaluation today patient appears to be doing well with regard to her wound on the left lateral leg. She has been tolerating the dressing changes without complication.  Fortunately there does not appear to be any signs of active infection at this time. No fever chills no 04/01/2021 upon evaluation today patient's wound is showing signs of improvement and overall very pleased with where things stand at this point. There is no evidence of active infection which is great news as well and in general I think that she is making great progress. 04/09/2021 upon evaluation today patient appears to be doing well with regard to her ankle ulcer. She is making good progress currently which is great news. There does not appear to be any signs of infection also excellent news. In general I am extremely pleased with where things stand today. No fevers, chills, nausea, vomiting, or diarrhea. 04/23/2021 upon inspection today patient appears to be doing quite well with regard to her leg ulcer. She has been tolerating the dressing changes without complication. She is can require some sharp debridement today but overall seems to be doing excellent. 05/06/2021 upon evaluation today patient unfortunately appears to be doing poorly overall in regard to her swelling. She is actually significantly more swollen than last week despite the fact that her wound is doing better this is not a good trend. I think that she probably needs to see her cardiologist ASAP and I discussed that with her today. This includes the fact that honestly I think she probably is getting need an increase in her diuretic or something to try to clear away some of the excess fluid that she is experiencing at the moment. She is not been doing her pumps even twice a day but again right now more concerned that if she were to do the pump she would  fluid overload her lungs causing other issues as far as her breathing is concerned. Obviously I think she needs to contact them and move up the appointment for the 12th of something sooner 2 weeks is a bit too long to wait with how swollen she has. 05/14/2021 upon evaluation today  patient's wound actually showing signs of being a little bit smaller compared to previous. She has been making good progress however. Fortunately there does not appear to be any evidence of active infection which is great. Overall I am extremely pleased in that regard. Nonetheless I am still concerned that she is having quite a bit of issue as far as the swelling is concerned she has been placed on fluid restriction as well as an increase in the furosemide by her doctor. We will see how things progress. 05/21/2021 upon evaluation today patient appears to be doing well with regard to her wound in general. She has been tolerating the dressing changes without complication. With that being said she has been using silver cell for a while and while this was showing signs of improvement it is now gotten to the point where this has slowed down quite significantly. I do believe that switching to a different dressing may be beneficial for her today. 05/28/2021 upon evaluation today patient actually appears to be doing quite well in regard to her wound. In fact there is really not any need for sharp debridement today based on what I see. The Hydrofera Blue is doing great and overall I think that she is managing quite nicely with the compression wrapping. Her edema is still down quite a bit with the wrapping in place. 06/11/2021 upon evaluation today patient appears to be doing well with regard to her leg ulcer. She has been tolerating the dressing changes without complication. Fortunately there does not appear to be any signs of active infection at this time which is great news. No fevers, chills, nausea, vomiting, or diarrhea. She is going require some sharp debridement today. 10/11; left leg ulcer much better looking and with improved surface area. She is using Hydrofera Blue under 3 layer compression. She does not have any stocking on the right leg which in itself has very significant nonpitting  edema 06/25/2021 upon evaluation today patient appears to be doing well with regard to the wound on her left leg. She has been tolerating the dressing changes without complication and overall I am extremely pleased with where things stand today. I do not see any evidence of infection which is great news. 07/02/2021 upon evaluation today patient's wound actually showing signs of excellent improvement and actually very pleased with where we stand today and the overall appearance. Fortunately there does not appear to be any signs of active infection. No fevers, chills, nausea, vomiting, or diarrhea. 07/09/2021 upon evaluation today patient appears to be doing better in regard to her wound this is measuring smaller and she is headed in the appropriate direction based on what I am seeing currently. I do not see any evidence of active infection systemically which is great news. 07/16/2021 upon evaluation today patient appears to be doing pretty well currently in regard to her leg ulcer. This is measuring smaller and looking much better this is great news. Fortunately there does not appear to be any evidence of active infection systemically which is great news [name], [name] ([id number]) as well. 07/23/2021 upon evaluation today patient's wound is actually showing signs of good improvement this is definitely measuring smaller. I do not see  any signs of infection which is great news and overall very pleased in that regard. Overall I think that she is doing well with the [facility]. 07/30/2020 upon evaluation today patient appears to be doing well in regard to her wound. She has been tolerating the dressing changes without complication think the Hydrofera Blue at this point is actually causing her to dry out a lot as far as the wound bed is concerned. Subsequently I think that we need to try to see what we can do to improve this. Fortunately there does not appear to be any evidence of active infection  at this time which is great news. 08/06/2021 upon evaluation today patient appears to be doing well with regards to her wound. Is not measuring significantly smaller initial inspection but upon closer inspection she has a lot of new skin growth across the central portion of the wound. I think will get very close to complete resolution. 08/13/2021 upon evaluation today patient appears to be doing well with regard to her wound. In fact this appears to be I believe healed although there is still some question as to whether it is completely so. I am concerned about the fact that to be honest she still has some evidence of dry skin that could be just trapping something underneath I still want to debride anything as I am afraid of causing some damage to the good skin for that reason I Georgina Peer probably monitor for 1 more week before completely closing everything out. 08/20/2021 upon evaluation today patient actually appears to be doing excellent. In fact she appears to be completely healed. This was the case last week as well we just wanted to make sure nothing reopened since she does not really have an ability to put on compression socks this is good to be something we need to make sure was well-healed. Nonetheless I think we have achieved that goal as of today. 12/27; this is a patient we discharged 2 weeks ago with wounds on her left lower leg laterally. She was supposed to get stockings and really did not get them. She developed blistering and reopening probably sometime late last week. She has 3 areas in the same area as previously. The mid calf dimensions on the left went from 39 to 51 cm today. She also has very significant edema on the right leg but as far as I am aware has not had wounds in this area 1/4; patient presents for follow-up. She has tolerated the compression wrap well. She has no issues or complaints today. 09/17/2021 upon evaluation today patient appears to be doing well with regard to  her wounds. She has been tolerating the dressing changes without complication. Fortunately there does not appear to be any signs of active infection at this time. No fevers, chills, nausea, vomiting, or diarrhea. 10/01/2021 upon evaluation today patient appears to be doing well with regard to her wound this is actually measuring better and looking smaller and I am very pleased in that regard. Fortunately I do not see any evidence of active infection locally nor systemically at this point. No fevers, chills, nausea, vomiting, or diarrhea. 10/08/2021 upon evaluation patient's wounds are actually showing some signs of improvement measuring a little bit smaller and looking a little bit better. Overall I do not think there is any need for sharp debridement today which is good news. No fevers, chills, nausea, vomiting, or diarrhea. 10/14/2021 upon evaluation today patient appears to be doing well with regard to her wound.  She has been tolerating the dressing changes without complication and overall I am extremely pleased with where things stand today. There is a little bit of film on the surface of the wounds but this was carefully cleaned away with just saline and gauze she really did not want me to do any sharp debridement today. 10/29/2021 upon evaluation today patient appears to be doing quite well in regard to her wounds. She is actually showing signs of excellent improvement which is great news and overall I feel like we are on the right track at this time. She does have a wound on both legs and she does need something compression wise when she heals for that reason we will get a go and see about ordering her bilateral juxta lite compression wraps. 11/05/2021 upon evaluation today patient appears to be doing well with regard to her wound. She has been tolerating the dressing changes without complication. Fortunately I do not see any evidence of active infection locally nor systemically at this time which  is great news. No fevers, chills, nausea, vomiting, or diarrhea. 11/12/2021 upon evaluation today patient appears to be doing well with regard to her wounds. She is doing excellent on the left on the right this is not doing quite as well there is some slough and biofilm buildup. 3/14; patient presents for follow-up. She has no issues or complaints today. She has tolerated the compression wraps well. 11/26/2021 upon evaluation today patient appears to be doing well with regard to her wounds. Both are showing signs of good improvement which is great news. I do not see any evidence of active infection and overall think she is healing quite nicely. 12/03/2021 upon evaluation today patient appears to be doing well with regard to her wounds. Both are showing signs of excellent improvement I am very pleased with where things stand and I think that she is making good progress here. Fortunately I do not see any signs of active infection locally or systemically which is great news. No fevers, chills, nausea, vomiting, or diarrhea. 12-10-2021 upon evaluation today patient's wounds actually appear to be doing awesome. I am extremely pleased with where we stand I think she is making excellent progress. I do not see any signs of active infection locally or systemically at this time. 12-17-2021 upon evaluation today patient's wounds are actually showing signs of good improvement. Fortunately I do not see any evidence of active infection locally nor systemically which is great news. Overall I do not believe that she is showing any signs of significant worsening which is good news. With that being said I do believe that she has a little bit of film on the surface of the wound I was actually able to clear this away with saline and gauze no sharp debridement was necessary. 12-24-2021 upon evaluation today patient appears to be doing well with regard to her wounds. I do not see any signs of active infection currently which is  great news and overall I think that we are on the right track here. No fevers, chills, nausea, vomiting, or diarrhea. 12-31-2021 upon evaluation today patient appears to be doing well with regard to her wounds. Both are showing signs of improvement the wound NOWACZYK, Jahnasia ([id number]) on the right leg is going require some sharp debridement on the left seems to be doing quite well. 01-07-2022 upon evaluation today patient appears to be doing well currently in regard to her wounds. She is actually making good progress bilaterally. Both of them require  little bit of sharp debridement but not too much which is great news. Upon inspection patient's wound bed actually showed signs of good granulation and epithelization on both legs. She still is having significant issues with the right leg compared to the left although I do feel like both are actually showing signs of pretty good improvement. 01-21-2022 upon evaluation today patient's wound is actually showing signs of excellent improvement I am very pleased with where we stand currently. I do not see any signs of active infection locally or systemically which is great news. No fevers, chills, nausea, vomiting, or diarrhea. 01-28-2022 upon evaluation today patient appears to be doing well with regard to her wound on the left leg this is pretty much just about healed which is great news. Unfortunately in regard to the wound on her right leg this is not doing nearly as well in fact I think we probably need to see about a culture today that was discussed with her. Objective Constitutional Well-nourished and well-hydrated in no acute distress. Vitals Time Taken: 3:28 PM, Height: 69 in, Weight: 244 lbs, BMI: 36, Temperature: 978.5 F, Pulse: 65 bpm, Respiratory Rate: 18 breaths/min, Blood Pressure: 160/76 mmHg. Respiratory normal breathing without difficulty. Psychiatric this patient is able to make decisions and demonstrates good insight into disease  process. Alert and Oriented x 3. pleasant and cooperative. General Notes: Upon inspection patient's wound bed actually showed signs of good granulation and epithelization at this point. Fortunately I do not see any signs of active infection locally or systemically which is great news and overall I am very pleased with where we stand. Integumentary (Hair, Skin) Wound #3 status is Open. Original cause of wound was Gradually Appeared. The date acquired was: 08/15/2021. The wound has been in treatment 21 weeks. The wound is located on the Left Lower Leg. The wound measures 0.5cm length x 0.5cm width x 0.1cm depth; 0.196cm^2 area and 0.02cm^3 volume. There is Fat Layer (Subcutaneous Tissue) exposed. There is no tunneling or undermining noted. There is a medium amount of serosanguineous drainage noted. There is large (67-100%) red, hyper - granulation within the wound bed. There is a small (1-33%) amount of necrotic tissue within the wound bed including Adherent Slough. Wound #4 status is Open. Original cause of wound was Gradually Appeared. The date acquired was: 10/27/2021. The wound has been in treatment 13 weeks. The wound is located on the Right,Lateral Lower Leg. The wound measures 4cm length x 4.5cm width x 0.1cm depth; 14.137cm^2 area and 1.414cm^3 volume. There is Fat Layer (Subcutaneous Tissue) exposed. There is no tunneling or undermining noted. There is a medium amount of serosanguineous drainage noted. There is large (67-100%) red, hyper - granulation within the wound bed. There is a small (1-33%) amount of necrotic tissue within the wound bed including Adherent Slough. Assessment Active Problems ICD-10 Lymphedema, not elsewhere classified Chronic venous hypertension (idiopathic) with ulcer and inflammation of left lower extremity Non-pressure chronic ulcer of other part of left lower leg with fat layer exposed Non-pressure chronic ulcer of other part of right lower leg with fat layer  exposed Procedures Dayhoff, Samiah ([id number]) Wound #3 Pre-procedure diagnosis of Wound #3 is a Lymphedema located on the Left Lower Leg . There was a Three Layer Compression Therapy Procedure by Carlene Coria, RN. Post procedure Diagnosis Wound #3: Same as Pre-Procedure Wound #4 Pre-procedure diagnosis of Wound #4 is a Venous Leg Ulcer located on the Right,Lateral Lower Leg . There was a Three Layer Compression Therapy Procedure by  Carlene Coria, RN. Post procedure Diagnosis Wound #4: Same as Pre-Procedure Plan Follow-up Appointments: Return Appointment in 1 week. Nurse Visit as needed Bathing/ Shower/ Hygiene: May shower with wound dressing protected with water repellent cover or cast protector. No tub bath. Anesthetic (Use 'Patient Medications' Section for Anesthetic Order Entry): Lidocaine applied to wound bed Edema Control - Lymphedema / Segmental Compressive Device / Other: Optional: One layer of unna paste to top of compression wrap (to act as an anchor). - she needs this Elevate, Exercise Daily and Avoid Standing for Long Periods of Time. Elevate legs to the level of the heart and pump ankles as often as possible Elevate leg(s) parallel to the floor when sitting. Compression Pump: Use compression pump on left lower extremity for 60 minutes, twice daily. - You may pump over wraps Compression Pump: Use compression pump on right lower extremity for 60 minutes, twice daily. - You may pump over wraps Additional Orders / Instructions: Follow Nutritious Diet and Increase Protein Intake The following medication(s) was prescribed: Bactrim DS oral 800 mg-160 mg tablet 1 1 tablet oral taken 2 times per day for 14 days. Do not take potassium while on this medication starting 01/28/2022 WOUND #3: - Lower Leg Wound Laterality: Left Cleanser: Soap and Water 1 x Per Week/30 Days Discharge Instructions: Gently cleanse wound with antibacterial soap, rinse and pat dry prior to dressing  wounds Cleanser: Wound Cleanser 1 x Per Week/30 Days Discharge Instructions: Wash your hands with soap and water. Remove old dressing, discard into plastic bag and place into trash. Cleanse the wound with Wound Cleanser prior to applying a clean dressing using gauze sponges, not tissues or cotton balls. Do not scrub or use excessive force. Pat dry using gauze sponges, not tissue or cotton balls. Primary Dressing: Silvercel Small 2x2 (in/in) 1 x Per Week/30 Days Discharge Instructions: Apply Silvercel Small 2x2 (in/in) as instructed Secondary Dressing: ABD Pad 5x9 (in/in) 1 x Per Week/30 Days Discharge Instructions: Cover with ABD pad Compression Wrap: 3-LAYER WRAP - Profore Lite LF 3 Multilayer Compression Bandaging System 1 x Per Week/30 Days Discharge Instructions: Apply 3 multi-layer wrap as prescribed. Compression Stockings: Circaid Juxta Lite Compression Wrap Compression Amount: 30-40 mmHg (left) Discharge Instructions: Apply Circaid Juxta Lite Compression Wrap as directed WOUND #4: - Lower Leg Wound Laterality: Right, Lateral Cleanser: Soap and Water 1 x Per Week/30 Days Discharge Instructions: Gently cleanse wound with antibacterial soap, rinse and pat dry prior to dressing wounds Cleanser: Wound Cleanser 1 x Per Week/30 Days Discharge Instructions: Wash your hands with soap and water. Remove old dressing, discard into plastic bag and place into trash. Cleanse the wound with Wound Cleanser prior to applying a clean dressing using gauze sponges, not tissues or cotton balls. Do not scrub or use excessive force. Pat dry using gauze sponges, not tissue or cotton balls. Peri-Wound Care: Desitin Maximum Strength Ointment 4 (oz) 1 x Per Week/30 Days Discharge Instructions: Apply around wound edges to avoid maceration Primary Dressing: Silvercel Small 2x2 (in/in) 1 x Per Week/30 Days Discharge Instructions: Apply Silvercel Small 2x2 (in/in) as instructed Secondary Dressing: ABD Pad 5x9  (in/in) 1 x Per Week/30 Days Discharge Instructions: Cover with ABD pad Compression Wrap: 3-LAYER WRAP - Profore Lite LF 3 Multilayer Compression Bandaging System 1 x Per Week/30 Days Discharge Instructions: Apply 3 multi-layer wrap as prescribed. Compression Stockings: Circaid Juxta Lite Compression Wrap Compression Amount: 20-30 mmHg (right) Discharge Instructions: Apply Circaid Juxta Lite Compression Wrap as directed BRESEE, Idalys ([id number]) 1.  I am going to recommend that we continue with the wound care measures for the left leg and the right leg. With that being said I am going to go ahead and obtain a wound culture today in order to check for infection as it seems that there is definitely something going on here. 2. I am also can recommend that we have the patient continue to monitor for any signs of worsening or infection if anything changes she should let me know but right now I definitely think we need to see about getting her on an antibiotic. My suggestion is good to be started on Bactrim DS and she will not take her potassium while on this medication. We will see patient back for reevaluation in 1 week here in the clinic. If anything worsens or changes patient will contact our office for additional recommendations. Electronic Signature(s) Signed: 01/29/2022 8:31:12 AM By: Worthy Keeler PA-C Entered By: Worthy Keeler on 01/29/2022 08:31:12 TASSIE, POLLETT ([id number]) -------------------------------------------------------------------------------- SuperBill Details Patient Name: [name] Date of Service: 01/28/2022 Medical Record Number: [id number] Patient Account Number: [id number] Date of Birth/Sex: 05-05-1946 (76 y.o. F) Treating RN: Carlene Coria Primary Care Provider: [name] Other Clinician: Referring Provider: [name] Treating Provider/Extender: Skipper Cliche in Treatment: 53 Diagnosis Coding ICD-10 Codes Code Description I89.0  Lymphedema, not elsewhere classified I87.332 Chronic venous hypertension (idiopathic) with ulcer and inflammation of left lower extremity L97.822 Non-pressure chronic ulcer of other part of left lower leg with fat layer exposed L97.812 Non-pressure chronic ulcer of other part of right lower leg with fat layer exposed Facility Procedures CPT4: Description Modifier Quantity Code [id number] BILATERAL: Application of multi-layer venous compression system; leg (below knee), including 1 ankle and foot. Physician Procedures CPT4 Code Description: [id number] - WC PHYS LEVEL 4 - EST PT Modifier: Quantity: 1 CPT4 Code Description: ICD-10 Diagnosis Description I89.0 Lymphedema, not elsewhere classified I87.332 Chronic venous hypertension (idiopathic) with ulcer and inflammation of L97.822 Non-pressure chronic ulcer of other part of left lower leg with fat  laye L97.812 Non-pressure chronic ulcer of other part of right lower leg with fat lay Modifier: left lower extremity r exposed er exposed Quantity: Electronic Signature(s) Signed: 01/29/2022 8:31:31 AM By: Worthy Keeler PA-C Entered By: Worthy Keeler on 01/29/2022 08:31:31

## 2022-01-29 ENCOUNTER — Other Ambulatory Visit
Admission: RE | Admit: 2022-01-29 | Discharge: 2022-01-29 | Disposition: A | Discharge status: Home / Self Care | Payer: Medicare Other | Source: Ambulatory Visit | Attending: Physician Assistant | Admitting: Physician Assistant

## 2022-01-29 DIAGNOSIS — B999 Unspecified infectious disease: Secondary | ICD-10-CM | POA: Diagnosis present

## 2022-01-30 NOTE — Progress Notes (Addendum)
CHALISA, KOBLER ([id number]) Visit Report for 01/28/2022 Arrival Information Details Patient Name: [name], [name] Date of Service: 01/28/2022 3:30 PM Medical Record Number: [id number] Patient Account Number: [id number] Date of Birth/Sex: 19-Jun-1946 (76 y.o. F) Treating RN: Carlene Coria Primary Care Nannie Starzyk: [name] Other Clinician: Referring Dequante Tremaine: [name] Treating Jayvon Mounger/Extender: Skipper Cliche in Treatment: 54 Visit Information History Since Last Visit All ordered tests and consults were completed: No Patient Arrived: Wheel Chair Added or deleted any medications: No Arrival Time: 15:22 Any new allergies or adverse reactions: No Accompanied By: self Had a fall or experienced change in No Transfer Assistance: None activities of daily living that may affect Patient Identification Verified: Yes risk of falls: Secondary Verification Process Completed: Yes Signs or symptoms of abuse/neglect since last visito No Patient Requires Transmission-Based Precautions: No Hospitalized since last visit: No Patient Has Alerts: Yes Implantable device outside of the clinic excluding No Patient Alerts: NOT DIABETIC cellular tissue based products placed in the center since last visit: Has Dressing in Place as Prescribed: Yes Has Compression in Place as Prescribed: Yes Pain Present Now: No Electronic Signature(s) Signed: 01/30/2022 2:19:12 PM By: Carlene Coria RN Entered By: Carlene Coria on 01/28/2022 15:28:43 [name] ([id number]) -------------------------------------------------------------------------------- Clinic Level of Care Assessment Details Patient Name: [name] Date of Service: 01/28/2022 3:30 PM Medical Record Number: [id number] Patient Account Number: [id number] Date of Birth/Sex: October 14, 1945 (76 y.o. F) Treating RN: Carlene Coria Primary Care Lenox Ladouceur: [name] Other Clinician: Referring Talise Sligh: [name] Treating Elgie Landino/Extender:  Skipper Cliche in Treatment: 53 Clinic Level of Care Assessment Items TOOL 1 Quantity Score '[]'$  - Use when EandM and Procedure is performed on INITIAL visit 0 ASSESSMENTS - Nursing Assessment / Reassessment '[]'$  - General Physical Exam (combine w/ comprehensive assessment (listed just below) when performed on new 0 pt. evals) '[]'$  - 0 Comprehensive Assessment (HX, ROS, Risk Assessments, Wounds Hx, etc.) ASSESSMENTS - Wound and Skin Assessment / Reassessment '[]'$  - Dermatologic / Skin Assessment (not related to wound area) 0 ASSESSMENTS - Ostomy and/or Continence Assessment and Care '[]'$  - Incontinence Assessment and Management 0 '[]'$  - 0 Ostomy Care Assessment and Management (repouching, etc.) PROCESS - Coordination of Care '[]'$  - Simple Patient / Family Education for ongoing care 0 '[]'$  - 0 Complex (extensive) Patient / Family Education for ongoing care '[]'$  - 0 Staff obtains Programmer, systems, Records, Test Results / Process Orders '[]'$  - 0 Staff telephones HHA, Nursing Homes / Clarify orders / etc '[]'$  - 0 Routine Transfer to another Facility (non-emergent condition) '[]'$  - 0 Routine Hospital Admission (non-emergent condition) '[]'$  - 0 New Admissions / Biomedical engineer / Ordering NPWT, Apligraf, etc. '[]'$  - 0 Emergency Hospital Admission (emergent condition) PROCESS - Special Needs '[]'$  - Pediatric / Minor Patient Management 0 '[]'$  - 0 Isolation Patient Management '[]'$  - 0 Hearing / Language / Visual special needs '[]'$  - 0 Assessment of Community assistance (transportation, D/C planning, etc.) '[]'$  - 0 Additional assistance / Altered mentation '[]'$  - 0 Support Surface(s) Assessment (bed, cushion, seat, etc.) INTERVENTIONS - Miscellaneous '[]'$  - External ear exam 0 '[]'$  - 0 Patient Transfer (multiple staff / Civil Service fast streamer / Similar devices) '[]'$  - 0 Simple Staple / Suture removal (25 or less) '[]'$  - 0 Complex Staple / Suture removal (26 or more) '[]'$  - 0 Hypo/Hyperglycemic Management (do not check if billed  separately) '[]'$  - 0 Ankle / Brachial Index (ABI) - do not check if billed separately Has the patient been seen at the hospital within the  last three years: Yes Total Score: 0 Level Of Care: ____ [name] ([id number]) Electronic Signature(s) Signed: 01/30/2022 2:19:12 PM By: Carlene Coria RN Entered By: Carlene Coria on 01/28/2022 15:59:10 [name] ([id number]) -------------------------------------------------------------------------------- Compression Therapy Details Patient Name: [name] Date of Service: 01/28/2022 3:30 PM Medical Record Number: [id number] Patient Account Number: [id number] Date of Birth/Sex: September 09, 1945 (76 y.o. F) Treating RN: Carlene Coria Primary Care Lovinia Snare: [name] Other Clinician: Referring Deboraha Goar: [name] Treating Okla Qazi/Extender: Skipper Cliche in Treatment: 53 Compression Therapy Performed for Wound Assessment: Wound #3 Left Lower Leg Performed By: Clinician Carlene Coria, RN Compression Type: Three Layer Post Procedure Diagnosis Same as Pre-procedure Electronic Signature(s) Signed: 01/30/2022 2:19:12 PM By: Carlene Coria RN Entered By: Carlene Coria on 01/28/2022 15:58:37 [name] ([id number]) -------------------------------------------------------------------------------- Compression Therapy Details Patient Name: [name] Date of Service: 01/28/2022 3:30 PM Medical Record Number: [id number] Patient Account Number: [id number] Date of Birth/Sex: 04/14/1946 (76 y.o. F) Treating RN: Carlene Coria Primary Care Tremaine Fuhriman: [name] Other Clinician: Referring Mansa Willers: [name] Treating Prudencio Velazco/Extender: Skipper Cliche in Treatment: 53 Compression Therapy Performed for Wound Assessment: Wound #4 Right,Lateral Lower Leg Performed By: Clinician Carlene Coria, RN Compression Type: Three Layer Post Procedure Diagnosis Same as Pre-procedure Electronic Signature(s) Signed: 01/30/2022 2:19:12 PM By:  Carlene Coria RN Entered By: Carlene Coria on 01/28/2022 15:58:56 [name] ([id number]) -------------------------------------------------------------------------------- Encounter Discharge Information Details Patient Name: [name] Date of Service: 01/28/2022 3:30 PM Medical Record Number: [id number] Patient Account Number: [id number] Date of Birth/Sex: October 02, 1945 (76 y.o. F) Treating RN: Carlene Coria Primary Care Haydin Dunn: [name] Other Clinician: Referring Cole Eastridge: [name] Treating Anneliese Leblond/Extender: Skipper Cliche in Treatment: 76 Encounter Discharge Information Items Discharge Condition: Stable Ambulatory Status: Ambulatory Discharge Destination: Home Transportation: Private Auto Accompanied By: self Schedule Follow-up Appointment: Yes Clinical Summary of Care: Patient Declined Electronic Signature(s) Signed: 01/30/2022 2:19:12 PM By: Carlene Coria RN Entered By: Carlene Coria on 01/28/2022 16:01:48 [name] ([id number]) -------------------------------------------------------------------------------- Lower Extremity Assessment Details Patient Name: [name] Date of Service: 01/28/2022 3:30 PM Medical Record Number: [id number] Patient Account Number: [id number] Date of Birth/Sex: [birth year] (76 y.o. F) Treating RN: Carlene Coria Primary Care Johncarlos Holtsclaw: [name] Other Clinician: Referring Edman Lipsey: [name] Treating Awesome Jared/Extender: Skipper Cliche in Treatment: 53 Edema Assessment Assessed: [Left: No] [Right: No] Edema: [Left: Yes] [Right: Yes] Calf Left: Right: Point of Measurement: 31 cm From Medial Instep 39 cm 40 cm Ankle Left: Right: Point of Measurement: 10 cm From Medial Instep 24 cm 25 cm Vascular Assessment Pulses: Dorsalis Pedis Palpable: [Left:Yes] [Right:Yes] Electronic Signature(s) Signed: 01/30/2022 2:19:12 PM By: Carlene Coria RN Entered By: Carlene Coria on 01/28/2022 15:43:57 [name]  ([id number]) -------------------------------------------------------------------------------- Multi Wound Chart Details Patient Name: [name] Date of Service: 01/28/2022 3:30 PM Medical Record Number: [id number] Patient Account Number: [id number] Date of Birth/Sex: Aug 11, 1946 (76 y.o. F) Treating RN: Carlene Coria Primary Care Mariadelosang Wynns: [name] Other Clinician: Referring Berwyn Bigley: [name] Treating Shawnte Winton/Extender: Skipper Cliche in Treatment: 50 Vital Signs Height(in): 66 Pulse(bpm): 13 Weight(lbs): 244 Blood Pressure(mmHg): 160/76 Body Mass Index(BMI): 36 Temperature(F): 978.5 Respiratory Rate(breaths/min): 18 Photos: [N/A:N/A] Wound Location: Left Lower Leg Right, Lateral Lower Leg N/A Wounding Event: Gradually Appeared Gradually Appeared N/A Primary Etiology: Lymphedema Venous Leg Ulcer N/A Comorbid History: Cataracts, Lymphedema, Sleep Cataracts, Lymphedema, Sleep N/A Apnea, Congestive Heart Failure, Apnea, Congestive Heart Failure, Hypertension, Osteoarthritis, Hypertension, Osteoarthritis, Neuropathy, Received Neuropathy, Received Chemotherapy, Received Radiation Chemotherapy, Received Radiation Date Acquired: 08/15/2021 10/27/2021 N/A Weeks of Treatment: 21 13 N/A  Wound Status: Open Open N/A Wound Recurrence: No No N/A Measurements L x W x D (cm) 0.5x0.5x0.1 4x4.5x0.1 N/A Area (cm) : 0.196 14.137 N/A Volume (cm) : 0.02 1.414 N/A % Reduction in Area: 99.70% -1100.10% N/A % Reduction in Volume: 99.70% -1098.30% N/A Classification: Full Thickness Without Exposed Full Thickness Without Exposed N/A Support Structures Support Structures Exudate Amount: Medium Medium N/A Exudate Type: Serosanguineous Serosanguineous N/A Exudate Color: red, brown red, brown N/A Granulation Amount: Large (67-100%) Large (67-100%) N/A Granulation Quality: Red, Hyper-granulation Red, Hyper-granulation N/A Necrotic Amount: Small (1-33%) Small (1-33%) N/A Exposed  Structures: Fat Layer (Subcutaneous Tissue): Fat Layer (Subcutaneous Tissue): N/A Yes Yes Fascia: No Fascia: No Tendon: No Tendon: No Muscle: No Muscle: No Joint: No Joint: No Bone: No Bone: No Epithelialization: None None N/A Treatment Notes Electronic Signature(s) Signed: 01/30/2022 2:19:12 PM By: Carlene Coria RN Entered By: Carlene Coria on 01/28/2022 15:44:13 [name] ([id number]) [name], [name] ([id number]) -------------------------------------------------------------------------------- Multi-Disciplinary Care Plan Details Patient Name: [name] Date of Service: 01/28/2022 3:30 PM Medical Record Number: [id number] Patient Account Number: [id number] Date of Birth/Sex: December 02, 1945 (76 y.o. F) Treating RN: Carlene Coria Primary Care Laira Penninger: [name] Other Clinician: Referring Chaka Boyson: [name] Treating Asha Grumbine/Extender: Skipper Cliche in Treatment: 83 Active Inactive Electronic Signature(s) Signed: 01/30/2022 2:19:12 PM By: Carlene Coria RN Entered By: Carlene Coria on 01/28/2022 15:44:04 [name] ([id number]) -------------------------------------------------------------------------------- Pain Assessment Details Patient Name: [name] Date of Service: 01/28/2022 3:30 PM Medical Record Number: [id number] Patient Account Number: [id number] Date of Birth/Sex: 11-Dec-1945 (76 y.o. F) Treating RN: Carlene Coria Primary Care Chasen Mendell: [name] Other Clinician: Referring Aerielle Stoklosa: [name] Treating Kamaree Wheatley/Extender: Skipper Cliche in Treatment: 15 Active Problems Location of Pain Severity and Description of Pain Patient Has Paino No Site Locations Pain Management and Medication Current Pain Management: Electronic Signature(s) Signed: 01/30/2022 2:19:12 PM By: Carlene Coria RN Entered By: Carlene Coria on 01/28/2022 15:29:21 [name]  ([id number]) -------------------------------------------------------------------------------- Patient/Caregiver Education Details Patient Name: [name] Date of Service: 01/28/2022 3:30 PM Medical Record Number: [id number] Patient Account Number: [id number] Date of Birth/Gender: 1945-09-10 (76 y.o. F) Treating RN: Carlene Coria Primary Care Physician: [name] Other Clinician: Referring Physician: [name] Treating Physician/Extender: Skipper Cliche in Treatment: 66 Education Assessment Education Provided To: Patient Education Topics Provided Wound/Skin Impairment: Methods: Explain/Verbal Responses: State content correctly Electronic Signature(s) Signed: 01/30/2022 2:19:12 PM By: Carlene Coria RN Entered By: Carlene Coria on 01/28/2022 16:00:46 [name] ([id number]) -------------------------------------------------------------------------------- Wound Assessment Details Patient Name: [name] Date of Service: 01/28/2022 3:30 PM Medical Record Number: [id number] Patient Account Number: [id number] Date of Birth/Sex: [birth year] (76 y.o. F) Treating RN: Carlene Coria Primary Care Kristofer Schaffert: [name] Other Clinician: Referring Kameron Blethen: [name] Treating Ife Vitelli/Extender: Skipper Cliche in Treatment: 53 Wound Status Wound Number: 3 Primary Lymphedema Etiology: Wound Location: Left Lower Leg Wound Open Wounding Event: Gradually Appeared Status: Date Acquired: 08/15/2021 Comorbid Cataracts, Lymphedema, Sleep Apnea, Congestive Heart Weeks Of Treatment: 21 History: Failure, Hypertension, Osteoarthritis, Neuropathy, Clustered Wound: No Received Chemotherapy, Received Radiation Photos Wound Measurements Length: (cm) 0.5 Width: (cm) 0.5 Depth: (cm) 0.1 Area: (cm) 0.196 Volume: (cm) 0.02 % Reduction in Area: 99.7% % Reduction in Volume: 99.7% Epithelialization: None Tunneling: No Undermining: No Wound Description Classification: Full  Thickness Without Exposed Support Structu Exudate Amount: Medium Exudate Type: Serosanguineous Exudate Color: red, brown res Foul Odor After Cleansing: No Slough/Fibrino Yes Wound Bed Granulation Amount: Large (67-100%) Exposed Structure Granulation Quality: Red, Hyper-granulation Fascia Exposed: No Necrotic Amount: Small (1-33%)  Fat Layer (Subcutaneous Tissue) Exposed: Yes Necrotic Quality: Adherent Slough Tendon Exposed: No Muscle Exposed: No Joint Exposed: No Bone Exposed: No Treatment Notes Wound #3 (Lower Leg) Wound Laterality: Left Cleanser Soap and Water Discharge Instruction: Gently cleanse wound with antibacterial soap, rinse and pat dry prior to dressing wounds Wound Cleanser [name], [name] ([id number]) Discharge Instruction: Wash your hands with soap and water. Remove old dressing, discard into plastic bag and place into trash. Cleanse the wound with Wound Cleanser prior to applying a clean dressing using gauze sponges, not tissues or cotton balls. Do not scrub or use excessive force. Pat dry using gauze sponges, not tissue or cotton balls. Peri-Wound Care Topical Primary Dressing Silvercel Small 2x2 (in/in) Discharge Instruction: Apply Silvercel Small 2x2 (in/in) as instructed Secondary Dressing ABD Pad 5x9 (in/in) Discharge Instruction: Cover with ABD pad Secured With Compression Wrap 3-LAYER WRAP - Profore Lite LF 3 Multilayer Compression Bandaging System Discharge Instruction: Apply 3 multi-layer wrap as prescribed. Compression Stockings Circaid Juxta Lite Compression Wrap Quantity: 1 Left Leg Compression Amount: 30-40 mmHg Discharge Instruction: Apply Circaid Juxta Lite Compression Wrap as directed Add-Ons Electronic Signature(s) Signed: 01/30/2022 2:19:12 PM By: Carlene Coria RN Entered By: Carlene Coria on 01/28/2022 15:42:18 [name] ([id number]) -------------------------------------------------------------------------------- Wound  Assessment Details Patient Name: [name] Date of Service: 01/28/2022 3:30 PM Medical Record Number: [id number] Patient Account Number: [id number] Date of Birth/Sex: 1946/04/25 (76 y.o. F) Treating RN: Carlene Coria Primary Care Itati Brocksmith: [name] Other Clinician: Referring Eleanore Junio: [name] Treating Harue Pribble/Extender: Skipper Cliche in Treatment: 53 Wound Status Wound Number: 4 Primary Venous Leg Ulcer Etiology: Wound Location: Right, Lateral Lower Leg Wound Open Wounding Event: Gradually Appeared Status: Date Acquired: 10/27/2021 Comorbid Cataracts, Lymphedema, Sleep Apnea, Congestive Heart Weeks Of Treatment: 13 History: Failure, Hypertension, Osteoarthritis, Neuropathy, Clustered Wound: No Received Chemotherapy, Received Radiation Photos Wound Measurements Length: (cm) 4 Width: (cm) 4.5 Depth: (cm) 0.1 Area: (cm) 14.137 Volume: (cm) 1.414 % Reduction in Area: -1100.1% % Reduction in Volume: -1098.3% Epithelialization: None Tunneling: No Undermining: No Wound Description Classification: Full Thickness Without Exposed Support Structures Exudate Amount: Medium Exudate Type: Serosanguineous Exudate Color: red, brown Foul Odor After Cleansing: No Slough/Fibrino Yes Wound Bed Granulation Amount: Large (67-100%) Exposed Structure Granulation Quality: Red, Hyper-granulation Fascia Exposed: No Necrotic Amount: Small (1-33%) Fat Layer (Subcutaneous Tissue) Exposed: Yes Necrotic Quality: Adherent Slough Tendon Exposed: No Muscle Exposed: No Joint Exposed: No Bone Exposed: No Treatment Notes Wound #4 (Lower Leg) Wound Laterality: Right, Lateral Cleanser Soap and Water Discharge Instruction: Gently cleanse wound with antibacterial soap, rinse and pat dry prior to dressing wounds Wound Cleanser [name], [name] ([id number]) Discharge Instruction: Wash your hands with soap and water. Remove old dressing, discard into plastic bag and place into  trash. Cleanse the wound with Wound Cleanser prior to applying a clean dressing using gauze sponges, not tissues or cotton balls. Do not scrub or use excessive force. Pat dry using gauze sponges, not tissue or cotton balls. Peri-Wound Care Desitin Maximum Strength Ointment 4 (oz) Discharge Instruction: Apply around wound edges to avoid maceration Topical Primary Dressing Silvercel Small 2x2 (in/in) Discharge Instruction: Apply Silvercel Small 2x2 (in/in) as instructed Secondary Dressing ABD Pad 5x9 (in/in) Discharge Instruction: Cover with ABD pad Secured With Compression Wrap 3-LAYER WRAP - Profore Lite LF 3 Multilayer Compression Bandaging System Discharge Instruction: Apply 3 multi-layer wrap as prescribed. Compression Stockings Circaid Juxta Lite Compression Wrap Quantity: 1 Right Leg Compression Amount: 20-30 mmHg Discharge Instruction: Apply Circaid Juxta Lite Compression Wrap as  directed Add-Ons Electronic Signature(s) Signed: 01/30/2022 2:19:12 PM By: Carlene Coria RN Entered By: Carlene Coria on 01/28/2022 15:43:02 [name] ([id number]) -------------------------------------------------------------------------------- [place] Details Patient Name: [name] Date of Service: 01/28/2022 3:30 PM Medical Record Number: [id number] Patient Account Number: [id number] Date of Birth/Sex: [birth year] (76 y.o. F) Treating RN: Carlene Coria Primary Care Dalani Mette: [name] Other Clinician: Referring Mckinley Adelstein: [name] Treating Dezarae Mcclaran/Extender: Skipper Cliche in Treatment: 53 Vital Signs Time Taken: 15:28 Temperature (F): 97.5 Height (in): 69 Pulse (bpm): 65 Weight (lbs): 244 Respiratory Rate (breaths/min): 18 Body Mass Index (BMI): 36 Blood Pressure (mmHg): 160/76 Reference Range: 80 - 120 mg / dl Electronic Signature(s) Signed: 02/05/2022 1:42:16 PM By: Carlene Coria RN Previous Signature: 01/30/2022 2:19:12 PM Version By: Carlene Coria RN Entered By:  Carlene Coria on 02/05/2022 13:42:16

## 2022-02-02 ENCOUNTER — Other Ambulatory Visit: Payer: Self-pay | Admitting: Oncology

## 2022-02-03 LAB — AEROBIC/ANAEROBIC CULTURE W GRAM STAIN (SURGICAL/DEEP WOUND): Gram Stain: NONE SEEN

## 2022-02-04 ENCOUNTER — Encounter: Payer: Medicare Other | Admitting: Physician Assistant

## 2022-02-04 DIAGNOSIS — I890 Lymphedema, not elsewhere classified: Secondary | ICD-10-CM | POA: Diagnosis not present

## 2022-02-04 NOTE — Progress Notes (Signed)
PAETON, LATOUCHE ([id number]) Visit Report for 02/04/2022 Arrival Information Details Patient Name: [name], [name] Date of Service: 02/04/2022 2:00 PM Medical Record Number: [id number] Patient Account Number: [id number] Date of Birth/Sex: 03-03-1946 (76 y.o. F) Treating RN: Cornell Barman Primary Care Pilar Corrales: [name] Other Clinician: Referring Croy Drumwright: [name] Treating Wilbern Pennypacker/Extender: Skipper Cliche in Treatment: 65 Visit Information History Since Last Visit Has Dressing in Place as Prescribed: Yes Patient Arrived: Wheel Chair Has Compression in Place as Prescribed: Yes Arrival Time: 14:10 Pain Present Now: No Accompanied By: self Transfer Assistance: Manual Patient Identification Verified: Yes Secondary Verification Process Completed: Yes Patient Requires Transmission-Based Precautions: No Patient Has Alerts: Yes Patient Alerts: NOT DIABETIC Electronic Signature(s) Signed: 02/04/2022 5:10:20 PM By: Gretta Cool, BSN, RN, CWS, Kim RN, BSN Entered By: Gretta Cool, BSN, RN, CWS, Kim on 02/04/2022 14:11:03 [name] ([id number]) -------------------------------------------------------------------------------- Compression Therapy Details Patient Name: [name] Date of Service: 02/04/2022 2:00 PM Medical Record Number: [id number] Patient Account Number: [id number] Date of Birth/Sex: 30-Nov-1945 (76 y.o. F) Treating RN: Cornell Barman Primary Care Maicie Vanderloop: [name] Other Clinician: Referring Tavonte Seybold: [name] Treating Lajuanda Penick/Extender: Skipper Cliche in Treatment: 99 Compression Therapy Performed for Wound Assessment: Wound #3 Left Lower Leg Performed By: Clinician Cornell Barman, RN Compression Type: Three Layer Post Procedure Diagnosis Same as Pre-procedure Notes Patient tolerating wraps well. Electronic Signature(s) Signed: 02/04/2022 5:10:20 PM By: Gretta Cool, BSN, RN, CWS, Kim RN, BSN Entered By: Gretta Cool, BSN, RN, CWS, Kim on 02/04/2022 15:04:10 [name] ([id number]) -------------------------------------------------------------------------------- Compression Therapy Details Patient Name: [name] Date of Service: 02/04/2022 2:00 PM Medical Record Number: [id number] Patient Account Number: [id number] Date of Birth/Sex: [birth year] (76 y.o. F) Treating RN: Cornell Barman Primary Care Mariapaula Krist: [name] Other Clinician: Referring Crosley Stejskal: [name] Treating Etosha Wetherell/Extender: Skipper Cliche in Treatment: 57 Compression Therapy Performed for Wound Assessment: Wound #4 Right,Lateral Lower Leg Performed By: Clinician Cornell Barman, RN Compression Type: Three Layer Post Procedure Diagnosis Same as Pre-procedure Notes Patient tolerating wraps well. Electronic Signature(s) Signed: 02/04/2022 5:10:20 PM By: Gretta Cool, BSN, RN, CWS, Kim RN, BSN Entered By: Gretta Cool, BSN, RN, CWS, Kim on 02/04/2022 15:04:27 [name] ([id number]) -------------------------------------------------------------------------------- Encounter Discharge Information Details Patient Name: [name] Date of Service: 02/04/2022 2:00 PM Medical Record Number: [id number] Patient Account Number: [id number] Date of Birth/Sex: 1945/09/09 (76 y.o. F) Treating RN: Cornell Barman Primary Care Keslee Harrington: [name] Other Clinician: Referring Tawnia Schirm: [name] Treating Laylana Gerwig/Extender: Skipper Cliche in Treatment: 62 Encounter Discharge Information Items Discharge Condition: Stable Ambulatory Status: Wheelchair Discharge Destination: Home Transportation: Private Auto Schedule Follow-up Appointment: Yes Clinical Summary of Care: Electronic Signature(s) Signed: 02/04/2022 5:10:20 PM By: Gretta Cool, BSN, RN, CWS, Kim RN, BSN Entered By: Gretta Cool, BSN, RN, CWS, Kim on 02/04/2022 15:06:17 [name] ([id number]) -------------------------------------------------------------------------------- Lower Extremity Assessment Details Patient Name: [name] Date of Service: 02/04/2022 2:00 PM Medical Record Number: [id number] Patient Account Number: [id number] Date of Birth/Sex: 1946-07-06 (76 y.o. F) Treating RN: Cornell Barman Primary Care Gwin Eagon: [name] Other Clinician: Referring Tazia Illescas: [name] Treating Dawnna Gritz/Extender: Skipper Cliche in Treatment: 54 Edema Assessment Assessed: [Left: No] [Right: No] [Left: Edema] [Right: :] Calf Left: Right: Point of Measurement: 31 cm From Medial Instep 37.5 cm 39 cm Ankle Left: Right: Point of Measurement: 10 cm From Medial Instep 26 cm 27 cm Vascular Assessment Pulses: Dorsalis Pedis Palpable: [Left:Yes] [Right:Yes] Electronic Signature(s) Signed: 02/04/2022 5:10:20 PM By: Gretta Cool, BSN, RN, CWS, Kim RN, BSN Entered By: Gretta Cool, BSN, RN, CWS, Kim on 02/04/2022 14:27:35 [name], [name] (  [id number]) -------------------------------------------------------------------------------- Multi Wound Chart Details Patient Name: [name], [name] Date of Service: 02/04/2022 2:00 PM Medical Record Number: [id number] Patient Account Number: [id number] Date of Birth/Sex: [birth year] (76 y.o. F) Treating RN: Cornell Barman Primary Care Tadarius Maland: [name] Other Clinician: Referring Breelynn Bankert: [name] Treating Rashanna Christiana/Extender: Skipper Cliche in Treatment: 79 Vital Signs Height(in): 69 Pulse(bpm): 55 Weight(lbs): 244 Blood Pressure(mmHg): 120/59 Body Mass Index(BMI): 36 Temperature(F): 98.2 Respiratory Rate(breaths/min): 16 Photos: [N/A:N/A] Wound Location: Left Lower Leg Right, Lateral Lower Leg N/A Wounding Event: Gradually Appeared Gradually Appeared N/A Primary Etiology: Lymphedema Venous Leg Ulcer N/A Date Acquired: 08/15/2021 10/27/2021 N/A Weeks of Treatment: 22 14 N/A Wound Status: Open Open N/A Wound Recurrence: No No N/A Measurements L x W x D (cm) 0.3x0.3x0.1 5.6x7x0.1 N/A Area (cm) : 0.071 30.788 N/A Volume (cm) : 0.007 3.079 N/A % Reduction in Area: 99.90%  -2513.60% N/A % Reduction in Volume: 99.90% -2509.30% N/A Classification: Full Thickness Without Exposed Full Thickness Without Exposed N/A Support Structures Support Structures Exudate Amount: Medium Medium N/A Exudate Type: Serosanguineous Serosanguineous N/A Exudate Color: red, brown red, brown N/A Treatment Notes Electronic Signature(s) Signed: 02/04/2022 5:10:20 PM By: Gretta Cool, BSN, RN, CWS, Kim RN, BSN Entered By: Gretta Cool, BSN, RN, CWS, Kim on 02/04/2022 15:03:30 [name] ([id number]) -------------------------------------------------------------------------------- Multi-Disciplinary Care Plan Details Patient Name: [name] Date of Service: 02/04/2022 2:00 PM Medical Record Number: [id number] Patient Account Number: [id number] Date of Birth/Sex: 04/02/1946 (76 y.o. F) Treating RN: Cornell Barman Primary Care Alakai Macbride: [name] Other Clinician: Referring Eleonore Shippee: [name] Treating Shalom Mcguiness/Extender: Skipper Cliche in Treatment: 26 Active Inactive Necrotic Tissue Nursing Diagnoses: Impaired tissue integrity related to necrotic/devitalized tissue Knowledge deficit related to management of necrotic/devitalized tissue Goals: Necrotic/devitalized tissue will be minimized in the wound bed Date Initiated: 02/04/2022 Target Resolution Date: 02/04/2022 Goal Status: Active Patient/caregiver will verbalize understanding of reason and process for debridement of necrotic tissue Date Initiated: 02/04/2022 Target Resolution Date: 02/04/2022 Goal Status: Active Interventions: Assess patient pain level pre-, during and post procedure and prior to discharge Provide education on necrotic tissue and debridement process Treatment Activities: Apply topical anesthetic as ordered : 02/04/2022 Excisional debridement : 02/04/2022 Notes: Pain, Acute or Chronic Nursing Diagnoses: Pain Management - Non-cyclic Acute (Procedural) Goals: Patient will verbalize adequate pain control and  receive pain control interventions during procedures as needed Date Initiated: 02/04/2022 Target Resolution Date: 02/04/2022 Goal Status: Active Patient/caregiver will verbalize adequate pain control between visits Date Initiated: 02/04/2022 Target Resolution Date: 02/04/2022 Goal Status: Active Patient/caregiver will verbalize comfort level met Date Initiated: 02/04/2022 Target Resolution Date: 02/04/2022 Goal Status: Active Interventions: Complete pain assessment as per visit requirements Provide education on pain management Treatment Activities: Administer pain control measures as ordered : 02/04/2022 Notes: Electronic Signature(s) Signed: 02/04/2022 5:10:20 PM By: Gretta Cool, BSN, RN, CWS, Kim RN, BSN Entered By: Gretta Cool, BSN, RN, CWS, Kim on 02/04/2022 14:30:20 [name] ([id number]) [place], [name] ([id number]) -------------------------------------------------------------------------------- Pain Assessment Details Patient Name: [name] Date of Service: 02/04/2022 2:00 PM Medical Record Number: [id number] Patient Account Number: [id number] Date of Birth/Sex: 12/08/1945 (76 y.o. F) Treating RN: Cornell Barman Primary Care Yomaris Palecek: [name] Other Clinician: Referring Imaan Padgett: [name] Treating Shery Waun[place]a/Extender: Skipper Cliche in Treatment: 74 Active Problems Location of Pain Severity and Description of Pain Patient Has Paino No Site Locations Pain Management and Medication Current Pain Management: Notes patient denies pain at this time. Electronic Signature(s) Signed: 02/04/2022 5:10:20 PM By: Gretta Cool, BSN, RN, CWS, Kim RN, BSN Entered By: Gretta Cool, BSN, RN, CWS,  Kim on 02/04/2022 14:13:04 [name], [name] ([id number]) -------------------------------------------------------------------------------- Patient/Caregiver Education Details Patient Name: [name], [name] Date of Service: 02/04/2022 2:00 PM Medical Record Number: [id number] Patient Account Number:  [id number] Date of Birth/Gender: [birth year] (76 y.o. F) Treating RN: Cornell Barman Primary Care Physician: [name] Other Clinician: Referring Physician: [name] Treating Physician/Extender: Skipper Cliche in Treatment: 42 Education Assessment Education Provided To: Patient Education Topics Provided Venous: Handouts: Controlling Swelling with Multilayered Compression Wraps Methods: Demonstration, Explain/Verbal Responses: State content correctly Electronic Signature(s) Signed: 02/04/2022 5:10:20 PM By: Gretta Cool, BSN, RN, CWS, Kim RN, BSN Entered By: Gretta Cool, BSN, RN, CWS, Kim on 02/04/2022 15:05:41 [name] ([id number]) -------------------------------------------------------------------------------- Wound Assessment Details Patient Name: [name] Date of Service: 02/04/2022 2:00 PM Medical Record Number: [id number] Patient Account Number: [id number] Date of Birth/Sex: 28-Aug-1946 (76 y.o. F) Treating RN: Cornell Barman Primary Care [place] Spainhower: [name] Other Clinician: Referring Daud Cayer: [name] Treating Chyanne Kohut/Extender: Skipper Cliche in Treatment: 25 Wound Status Wound Number: 3 Primary Etiology: Lymphedema Wound Location: Left Lower Leg Wound Status: Open Wounding Event: Gradually Appeared Date Acquired: 08/15/2021 Weeks Of Treatment: 22 Clustered Wound: No Photos Photo Uploaded By: Gretta Cool, BSN, RN, CWS, Kim on 02/04/2022 14:31:00 Wound Measurements Length: (cm) 0.3 Width: (cm) 0.3 Depth: (cm) 0.1 Area: (cm) 0.071 Volume: (cm) 0.007 % Reduction in Area: 99.9% % Reduction in Volume: 99.9% Wound Description Classification: Full Thickness Without Exposed Support Structu Exudate Amount: Medium Exudate Type: Serosanguineous Exudate Color: red, brown res Treatment Notes Wound #3 (Lower Leg) Wound Laterality: Left Cleanser Soap and Water Discharge Instruction: Gently cleanse wound with antibacterial soap, rinse and pat dry prior to  dressing wounds Wound Cleanser Discharge Instruction: Wash your hands with soap and water. Remove old dressing, discard into plastic bag and place into trash. Cleanse the wound with Wound Cleanser prior to applying a clean dressing using gauze sponges, not tissues or cotton balls. Do not scrub or use excessive force. Pat dry using gauze sponges, not tissue or cotton balls. Peri-Wound Care Moisturizing Lotion Discharge Instruction: Suggestions: Theraderm, Eucerin, Cetaphil, or patient preference. Topical [name], [name] ([id number]) Primary Dressing Silvercel Small 2x2 (in/in) Discharge Instruction: Apply Silvercel Small 2x2 (in/in) as instructed Secondary Dressing Secured With Compression Wrap 3-LAYER WRAP - Profore Lite LF 3 Multilayer Compression Bandaging System Discharge Instruction: Apply 3 multi-layer wrap as prescribed. Compression Stockings Circaid Juxta Lite Compression Wrap Quantity: 1 Left Leg Compression Amount: 30-40 mmHg Discharge Instruction: Apply Circaid Juxta Lite Compression Wrap as directed Add-Ons Electronic Signature(s) Signed: 02/04/2022 5:10:20 PM By: Gretta Cool, BSN, RN, CWS, Kim RN, BSN Entered By: Gretta Cool, BSN, RN, CWS, Kim on 02/04/2022 14:24:16 [name] ([id number]) -------------------------------------------------------------------------------- Wound Assessment Details Patient Name: [name] Date of Service: 02/04/2022 2:00 PM Medical Record Number: [id number] Patient Account Number: [id number] Date of Birth/Sex: December 28, 1945 (76 y.o. F) Treating RN: Cornell Barman Primary Care Rodolfo Notaro: [name] Other Clinician: Referring Corby Villasenor: [name] Treating Samera Macy/Extender: Skipper Cliche in Treatment: 54 Wound Status Wound Number: 4 Primary Etiology: Venous Leg Ulcer Wound Location: Right, Lateral Lower Leg Wound Status: Open Wounding Event: Gradually Appeared Date Acquired: 10/27/2021 Weeks Of Treatment: 14 Clustered Wound:  No Photos Photo Uploaded By: Gretta Cool, BSN, RN, CWS, Kim on 02/04/2022 14:31:13 Wound Measurements Length: (cm) 5.6 Width: (cm) 7 Depth: (cm) 0.1 Area: (cm) 30.788 Volume: (cm) 3.079 % Reduction in Area: -2513.6% % Reduction in Volume: -2509.3% Wound Description Classification: Full Thickness Without Exposed Support Structu Exudate Amount: Medium Exudate Type: Serosanguineous Exudate Color: red, brown res Treatment Notes Wound #4 (  Lower Leg) Wound Laterality: Right, Lateral Cleanser Soap and Water Discharge Instruction: Gently cleanse wound with antibacterial soap, rinse and pat dry prior to dressing wounds Wound Cleanser Discharge Instruction: Wash your hands with soap and water. Remove old dressing, discard into plastic bag and place into trash. Cleanse the wound with Wound Cleanser prior to applying a clean dressing using gauze sponges, not tissues or cotton balls. Do not scrub or use excessive force. Pat dry using gauze sponges, not tissue or cotton balls. Peri-Wound Care Desitin Maximum Strength Ointment 4 (oz) Discharge Instruction: Apply around wound edges to avoid maceration Topical DENICA, WEB ([id number]) Primary Dressing Silvercel Small 2x2 (in/in) Discharge Instruction: Apply Silvercel Small 2x2 (in/in) as instructed Secondary Dressing ABD Pad 5x9 (in/in) Discharge Instruction: Cover with ABD pad Secured With Compression Wrap 3-LAYER WRAP - Profore Lite LF 3 Multilayer Compression Bandaging System Discharge Instruction: Apply 3 multi-layer wrap as prescribed. Compression Stockings Circaid Juxta Lite Compression Wrap Quantity: 1 Right Leg Compression Amount: 20-30 mmHg Discharge Instruction: Apply Circaid Juxta Lite Compression Wrap as directed Add-Ons Electronic Signature(s) Signed: 02/04/2022 5:10:20 PM By: Gretta Cool, BSN, RN, CWS, Kim RN, BSN Entered By: Gretta Cool, BSN, RN, CWS, Kim on 02/04/2022 14:24:16 [name]  ([id number]) -------------------------------------------------------------------------------- [place] Details Patient Name: [name] Date of Service: 02/04/2022 2:00 PM Medical Record Number: [id number] Patient Account Number: [id number] Date of Birth/Sex: 17-Aug-1946 (76 y.o. F) Treating RN: Cornell Barman Primary Care Yanni Quiroa: [name] Other Clinician: Referring Manford Sprong: [name] Treating Daylin Eads/Extender: Skipper Cliche in Treatment: 70 Vital Signs Time Taken: 14:11 Temperature (F): 98.2 Height (in): 69 Pulse (bpm): 55 Weight (lbs): 244 Respiratory Rate (breaths/min): 16 Body Mass Index (BMI): 36 Blood Pressure (mmHg): 120/59 Reference Range: 80 - 120 mg / dl Electronic Signature(s) Signed: 02/04/2022 5:10:20 PM By: Gretta Cool, BSN, RN, CWS, Kim RN, BSN Entered By: Gretta Cool, BSN, RN, CWS, Kim on 02/04/2022 14:12:48

## 2022-02-04 NOTE — Progress Notes (Addendum)
ALYSSON, GEIST ([id number]) Visit Report for 02/04/2022 Chief Complaint Document Details Patient Name: [name], [name] Date of Service: 02/04/2022 2:00 PM Medical Record Number: [id number] Patient Account Number: [id number] Date of Birth/Sex: 1945/11/29 (76 y.o. F) Treating RN: Cornell Barman Primary Care Provider: [name] Other Clinician: Referring Provider: [name] Treating Provider/Extender: Skipper Cliche in Treatment: 84 Information Obtained from: Patient Chief Complaint Bilateral LE Ulcers Electronic Signature(s) Signed: 02/04/2022 2:11:58 PM By: Worthy Keeler PA-C Entered By: Worthy Keeler on 02/04/2022 14:11:58 [name] ([id number]) -------------------------------------------------------------------------------- HPI Details Patient Name: [name] Date of Service: 02/04/2022 2:00 PM Medical Record Number: [id number] Patient Account Number: [id number] Date of Birth/Sex: 1945-09-30 (76 y.o. F) Treating RN: Cornell Barman Primary Care Provider: [name] Other Clinician: Referring Provider: [name] Treating Provider/Extender: Skipper Cliche in Treatment: 30 History of Present Illness HPI Description: 03/15/2020 upon evaluation today patient presents today for initial evaluation here in our clinic concerning a wound that is on the left posterior lower extremity. Unfortunately this has been giving the patient some discomfort at this point which she notes has been affected her pretty much daily. The patient does have a history of venous insufficiency, hypertension, congestive heart failure, and a history of breast cancer. Fortunately there does not appear to be evidence of active infection at this time which is great news. No fevers, chills, nausea, vomiting, or diarrhea. Wound is not extremely large but does have some slough covering the surface of the wound. This is going require sharp debridement today. 03/15/2020 upon evaluation today patient  appears to be doing fairly well in regard to her wound. She does have some slough noted on the surface of the wound currently but I do believe the Iodoflex has been beneficial over the past week. There is no signs of active infection at this time which is great news and overall very pleased with the progress. No fevers, chills, nausea, vomiting, or diarrhea. 04/02/2020 upon evaluation today patient appears to be doing a little better in regard to her wound size wise this is not tremendously smaller she does have some slough buildup on the surface of the wound. With that being said this is going require some sharp debridement today to clear away some of the necrotic debris. Fortunately there is no evidence of active infection at this time. No fevers, chills, nausea, vomiting, or diarrhea. 04/10/2020 on evaluation today patient appears to be doing well with regard to her ulcer which is measuring somewhat smaller today. She actually has more granulation tissue noted which is also good news is no need for sharp debridement today. Fortunately there is no evidence of infection either which is also excellent. No fevers, chills, nausea, vomiting, or diarrhea. 04/26/2020 on evaluation today patient's wound actually is showing signs of good improvement which is great news. There does not appear to be any evidence of active infection. I do believe she is tolerating the collagen at this point. 05/03/2020 upon evaluation today patient's wound actually showed signs of good granulation at this time there does not appear to be any evidence of active infection which is great news and overall very pleased with where things stand. With that being said I do believe that the patient is can require some sharp debridement today but fortunately nothing too significant. 05/11/20 on evaluation today patient appears to be doing well in regard to her leg ulcer. She's been tolerating the dressing changes without complication.  Fortunately there is no signs of active infection at this time.  Overall I'm very pleased with where things stand. 05/18/2020 upon evaluation today patient's wound is showing signs of improvement is very dry and I think the collagen for the most part just dried to the wound bed. I am going to clean this away with sharp debridement in order to get this under control in my opinion. 05/25/2020 upon evaluation today patient actually appears to be doing well in regard to her leg ulcer. In fact upon inspection it appears she could potentially be completely healed but that is not guaranteed based on what we are seeing. There does not appear to be any signs of active infection which is great news. 05/31/2020 on evaluation today patient appears to be doing well with regard to her wounds. She in fact appears to be almost completely healed on the left leg there is just a very small opening remaining point 1 06/08/2020 upon evaluation today patient appears to be doing excellent in regard to her leg ulcer on the left in fact it appears to be that she is completely healed as of today. I see no signs of active infection at this time which is great news. No fevers, chills, nausea, vomiting, or diarrhea. READMISSION 01/16/2021 Patient that we have had in this clinic with a wound on the left posterior calf in the summer 2021 extending into October. She was felt to have chronic venous insufficiency. Per the patient's description she was discharged with what sounds like external compression stockings although she could not afford the "$75 per leg". She therefore did not get anything. Apparently her wound that she has currently is been in existence since January. She has been followed at vein and vascular with Unna boots and I think calcium alginate. She had ABIs done on 06/25/2020 that showed an noncompressible ABI on the right leg and the left leg but triphasic waveforms with good great toe pressures and a TBI of 0.90 on the  right and 0.91 on the left Surprisingly the patient also had venous reflux studies that were really unremarkable. This included no evidence of DVTs or SVTs but there was no evidence of deep venous insufficiency or superficial venous insufficiency in the greater saphenous or short saphenous veins bilaterally. I would wonder about more central venous issues or perhaps this is all lymphedema 01/25/2021 upon evaluation today patient appears to be doing decently well and guard to her wound. The good news is the Iodoflex that is job she saw Dr. Dellia Nims last week for readmission and to be honest I think that this has done extremely well over that week. I do believe that she can tolerate the sharp debridement today which will be good. I think that cleaning off the wound will likely allow [place] to be able to use a different type of dressing I do not think the Iodoflex will even be necessary based on how clean the wound looks. Fortunately there is no sign of active infection at this time which is great news. No fevers, chills, nausea, vomiting, or diarrhea. 02/12/2021 upon evaluation today patient appears to be doing well with regard to her leg ulcer. We did switch to alginate when she came for nurse visit I think is doing much better for her. Fortunately there does not appear to be any signs of active infection at this time which is great news. No fevers, chills, nausea, vomiting, or diarrhea. [name], [name] ([id number]) 02/18/2021 upon evaluation today patient's wound is actually showing signs of good improvement. I am very pleased with where things stand  currently. I do not see any signs of active infection which is great news and overall I feel like she is making good progress. The patient likewise is happy that things are doing so well and that this is measuring somewhat smaller. 02/25/2021 upon evaluation today patient actually appears to be doing quite well in regard to her wounds. She has been tolerating the  dressing changes without complication. Fortunately there does not appear to be any signs of active infection which is great news and overall I am extremely pleased with where things stand. No fevers, chills, nausea, vomiting, or diarrhea. She does have a lot of edema I really do think she needs compression socks to be worn in order to prevent things from causing additional issues for her currently. Especially on the right leg she should be wearing compression socks all the time to be honest. 03/04/2021 upon evaluation today patient's leg ulcer actually appears to be doing pretty well which is great news. There does not appear to be any significant change but overall the appearance is better even though the size is not necessarily reflecting a great improvement. Fortunately there does not appear to be any signs of active infection. No fevers, chills, nausea, vomiting, or diarrhea. 03/12/2021 upon evaluation today patient appears to be doing well with regard to her wounds. She has been tolerating the dressing changes without complication. The good news is that the wound on her left lateral leg is doing much better and is showing signs of good improvement. Overall her swelling is controlled well as well. She does need some compression socks she plans to go Jefferey socks next week. 03/18/2021 on evaluation today patient's wound actually is showing signs of good improvement. She does have a little bit of slough this can require some sharp debridement today. 03/25/2021 upon evaluation today patient appears to be doing well with regard to her wound on the left lateral leg. She has been tolerating the dressing changes without complication. Fortunately there does not appear to be any signs of active infection at this time. No fever chills no 04/01/2021 upon evaluation today patient's wound is showing signs of improvement and overall very pleased with where things stand at this point. There is no evidence of active  infection which is great news as well and in general I think that she is making great progress. 04/09/2021 upon evaluation today patient appears to be doing well with regard to her ankle ulcer. She is making good progress currently which is great news. There does not appear to be any signs of infection also excellent news. In general I am extremely pleased with where things stand today. No fevers, chills, nausea, vomiting, or diarrhea. 04/23/2021 upon inspection today patient appears to be doing quite well with regard to her leg ulcer. She has been tolerating the dressing changes without complication. She is can require some sharp debridement today but overall seems to be doing excellent. 05/06/2021 upon evaluation today patient unfortunately appears to be doing poorly overall in regard to her swelling. She is actually significantly more swollen than last week despite the fact that her wound is doing better this is not a good trend. I think that she probably needs to see her cardiologist ASAP and I discussed that with her today. This includes the fact that honestly I think she probably is getting need an increase in her diuretic or something to try to clear away some of the excess fluid that she is experiencing at the moment. She is  not been doing her pumps even twice a day but again right now more concerned that if she were to do the pump she would fluid overload her lungs causing other issues as far as her breathing is concerned. Obviously I think she needs to contact them and move up the appointment for the 12th of something sooner 2 weeks is a bit too long to wait with how swollen she has. 05/14/2021 upon evaluation today patient's wound actually showing signs of being a little bit smaller compared to previous. She has been making good progress however. Fortunately there does not appear to be any evidence of active infection which is great. Overall I am extremely pleased in that regard. Nonetheless I am  still concerned that she is having quite a bit of issue as far as the swelling is concerned she has been placed on fluid restriction as well as an increase in the furosemide by her doctor. We will see how things progress. 05/21/2021 upon evaluation today patient appears to be doing well with regard to her wound in general. She has been tolerating the dressing changes without complication. With that being said she has been using silver cell for a while and while this was showing signs of improvement it is now gotten to the point where this has slowed down quite significantly. I do believe that switching to a different dressing may be beneficial for her today. 05/28/2021 upon evaluation today patient actually appears to be doing quite well in regard to her wound. In fact there is really not any need for sharp debridement today based on what I see. The Hydrofera Blue is doing great and overall I think that she is managing quite nicely with the compression wrapping. Her edema is still down quite a bit with the wrapping in place. 06/11/2021 upon evaluation today patient appears to be doing well with regard to her leg ulcer. She has been tolerating the dressing changes without complication. Fortunately there does not appear to be any signs of active infection at this time which is great news. No fevers, chills, nausea, vomiting, or diarrhea. She is going require some sharp debridement today. 10/11; left leg ulcer much better looking and with improved surface area. She is using Hydrofera Blue under 3 layer compression. She does not have any stocking on the right leg which in itself has very significant nonpitting edema 06/25/2021 upon evaluation today patient appears to be doing well with regard to the wound on her left leg. She has been tolerating the dressing changes without complication and overall I am extremely pleased with where things stand today. I do not see any evidence of infection which is great  news. 07/02/2021 upon evaluation today patient's wound actually showing signs of excellent improvement and actually very pleased with where we stand today and the overall appearance. Fortunately there does not appear to be any signs of active infection. No fevers, chills, nausea, vomiting, or diarrhea. 07/09/2021 upon evaluation today patient appears to be doing better in regard to her wound this is measuring smaller and she is headed in the appropriate direction based on what I am seeing currently. I do not see any evidence of active infection systemically which is great news. 07/16/2021 upon evaluation today patient appears to be doing pretty well currently in regard to her leg ulcer. This is measuring smaller and looking much better this is great news. Fortunately there does not appear to be any evidence of active infection systemically which is great news as well.  07/23/2021 upon evaluation today patient's wound is actually showing signs of good improvement this is definitely measuring smaller. I do not see any signs of infection which is great news and overall very pleased in that regard. Overall I think that she is doing well with the Jahnay Lantier, [place] ([id number]) [place]. 07/30/2020 upon evaluation today patient appears to be doing well in regard to her wound. She has been tolerating the dressing changes without complication think the Hydrofera Blue at this point is actually causing her to dry out a lot as far as the wound bed is concerned. Subsequently I think that we need to try to see what we can do to improve this. Fortunately there does not appear to be any evidence of active infection at this time which is great news. 08/06/2021 upon evaluation today patient appears to be doing well with regards to her wound. Is not measuring significantly smaller initial inspection but upon closer inspection she has a lot of new skin growth across the central portion of the wound. I think will  get very close to complete resolution. 08/13/2021 upon evaluation today patient appears to be doing well with regard to her wound. In fact this appears to be I believe healed although there is still some question as to whether it is completely so. I am concerned about the fact that to be honest she still has some evidence of dry skin that could be just trapping something underneath I still want to debride anything as I am afraid of causing some damage to the good skin for that reason I Georgina Peer probably monitor for 1 more week before completely closing everything out. 08/20/2021 upon evaluation today patient actually appears to be doing excellent. In fact she appears to be completely healed. This was the case last week as well we just wanted to make sure nothing reopened since she does not really have an ability to put on compression socks this is good to be something we need to make sure was well-healed. Nonetheless I think we have achieved that goal as of today. 12/27; this is a patient we discharged 2 weeks ago with wounds on her left lower leg laterally. She was supposed to get stockings and really did not get them. She developed blistering and reopening probably sometime late last week. She has 3 areas in the same area as previously. The mid calf dimensions on the left went from 39 to 51 cm today. She also has very significant edema on the right leg but as far as I am aware has not had wounds in this area 1/4; patient presents for follow-up. She has tolerated the compression wrap well. She has no issues or complaints today. 09/17/2021 upon evaluation today patient appears to be doing well with regard to her wounds. She has been tolerating the dressing changes without complication. Fortunately there does not appear to be any signs of active infection at this time. No fevers, chills, nausea, vomiting, or diarrhea. 10/01/2021 upon evaluation today patient appears to be doing well with regard to her  wound this is actually measuring better and looking smaller and I am very pleased in that regard. Fortunately I do not see any evidence of active infection locally nor systemically at this point. No fevers, chills, nausea, vomiting, or diarrhea. 10/08/2021 upon evaluation patient's wounds are actually showing some signs of improvement measuring a little bit smaller and looking a little bit better. Overall I do not think there is any need for sharp debridement today which  is good news. No fevers, chills, nausea, vomiting, or diarrhea. 10/14/2021 upon evaluation today patient appears to be doing well with regard to her wound. She has been tolerating the dressing changes without complication and overall I am extremely pleased with where things stand today. There is a little bit of film on the surface of the wounds but this was carefully cleaned away with just saline and gauze she really did not want me to do any sharp debridement today. 10/29/2021 upon evaluation today patient appears to be doing quite well in regard to her wounds. She is actually showing signs of excellent improvement which is great news and overall I feel like we are on the right track at this time. She does have a wound on both legs and she does need something compression wise when she heals for that reason we will get a go and see about ordering her bilateral juxta lite compression wraps. 11/05/2021 upon evaluation today patient appears to be doing well with regard to her wound. She has been tolerating the dressing changes without complication. Fortunately I do not see any evidence of active infection locally nor systemically at this time which is great news. No fevers, chills, nausea, vomiting, or diarrhea. 11/12/2021 upon evaluation today patient appears to be doing well with regard to her wounds. She is doing excellent on the left on the right this is not doing quite as well there is some slough and biofilm buildup. 3/14; patient  presents for follow-up. She has no issues or complaints today. She has tolerated the compression wraps well. 11/26/2021 upon evaluation today patient appears to be doing well with regard to her wounds. Both are showing signs of good improvement which is great news. I do not see any evidence of active infection and overall think she is healing quite nicely. 12/03/2021 upon evaluation today patient appears to be doing well with regard to her wounds. Both are showing signs of excellent improvement I am very pleased with where things stand and I think that she is making good progress here. Fortunately I do not see any signs of active infection locally or systemically which is great news. No fevers, chills, nausea, vomiting, or diarrhea. 12-10-2021 upon evaluation today patient's wounds actually appear to be doing awesome. I am extremely pleased with where we stand I think she is making excellent progress. I do not see any signs of active infection locally or systemically at this time. 12-17-2021 upon evaluation today patient's wounds are actually showing signs of good improvement. Fortunately I do not see any evidence of active infection locally nor systemically which is great news. Overall I do not believe that she is showing any signs of significant worsening which is good news. With that being said I do believe that she has a little bit of film on the surface of the wound I was actually able to clear this away with saline and gauze no sharp debridement was necessary. 12-24-2021 upon evaluation today patient appears to be doing well with regard to her wounds. I do not see any signs of active infection currently which is great news and overall I think that we are on the right track here. No fevers, chills, nausea, vomiting, or diarrhea. 12-31-2021 upon evaluation today patient appears to be doing well with regard to her wounds. Both are showing signs of improvement the wound on the right leg is going require  some sharp debridement on the left seems to be doing quite well. 01-07-2022 upon evaluation today patient  appears to be doing well currently in regard to her wounds. She is actually making good progress bilaterally. Both of them require little bit of sharp debridement but not too much which is great news. [name], [name] ([id number]) Upon inspection patient's wound bed actually showed signs of good granulation and epithelization on both legs. She still is having significant issues with the right leg compared to the left although I do feel like both are actually showing signs of pretty good improvement. 01-21-2022 upon evaluation today patient's wound is actually showing signs of excellent improvement I am very pleased with where we stand currently. I do not see any signs of active infection locally or systemically which is great news. No fevers, chills, nausea, vomiting, or diarrhea. 01-28-2022 upon evaluation today patient appears to be doing well with regard to her wound on the left leg this is pretty much just about healed which is great news. Unfortunately in regard to the wound on her right leg this is not doing nearly as well in fact I think we probably need to see about a culture today that was discussed with her. 02-04-2022 upon evaluation today patient appears to be doing well with regard to her left leg which in fact might actually be completely healed I am not 100% sure. Nonetheless this is shown signs of excellent improvement which is great news. With that being said in regards to the right leg there is some slough and film buildup on the surface of the wound although she is feeling much better than last week this does appear to be doing much better than it was last week. Fortunately there does not appear to be any signs of active infection locally or systemically at this time. Electronic Signature(s) Signed: 02/04/2022 3:08:04 PM By: Worthy Keeler PA-C Entered By: Worthy Keeler on  02/04/2022 15:08:04 [name], [name] ([id number]) -------------------------------------------------------------------------------- Physical Exam Details Patient Name: [name] Date of Service: 02/04/2022 2:00 PM Medical Record Number: [id number] Patient Account Number: [id number] Date of Birth/Sex: Dec 17, 1945 (76 y.o. F) Treating RN: Cornell Barman Primary Care Provider: [name] Other Clinician: Referring Provider: [name] Treating Provider/Extender: Skipper Cliche in Treatment: 83 Constitutional Obese and well-hydrated in no acute distress. Respiratory normal breathing without difficulty. Psychiatric this patient is able to make decisions and demonstrates good insight into disease process. Alert and Oriented x 3. pleasant and cooperative. Notes Upon inspection patient's wound does appear to be doing much better on the left in fact this may be closed on the right the infection appears to be significantly improved. I am still can avoid any debridement today due to the fact that this is just starting to get better and already there is a lot of general improvement here. I do think we will continue to monitor and keep a close eye on things nonetheless. Electronic Signature(s) Signed: 02/04/2022 3:08:51 PM By: Worthy Keeler PA-C Entered By: Worthy Keeler on 02/04/2022 15:08:51 [name] ([id number]) -------------------------------------------------------------------------------- Physician Orders Details Patient Name: [name] Date of Service: 02/04/2022 2:00 PM Medical Record Number: [id number] Patient Account Number: [id number] Date of Birth/Sex: 1945-10-25 (76 y.o. 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 o Nurse Visit as needed Bathing/ Shower/ Hygiene o May shower with wound dressing protected with water repellent cover or cast protector. o No tub bath. Anesthetic (Use 'Patient Medications' Section for Anesthetic Order Entry) o Lidocaine applied to wound bed Edema Control - Lymphedema / Segmental Compressive Device / Other Bilateral Lower Extremities o Optional: One layer of unna paste to top of compression wrap (to act as an anchor). - she needs this o Elevate, Exercise Daily and Avoid Standing for Long Periods of Time. o Elevate legs to the level of the heart and pump ankles as often as possible o Elevate leg(s) parallel to the floor when sitting. o Compression Pump: Use compression pump on left lower extremity for 60 minutes, twice daily. - You may pump over wraps o Compression Pump: Use compression pump on right lower extremity for 60 minutes, twice daily. - You may pump over wraps Additional Orders / Instructions o Follow Nutritious Diet and Increase Protein Intake Wound Treatment Wound #3 - Lower Leg Wound Laterality: Left Cleanser: Soap and Water 1 x Per Week/30 Days Discharge Instructions: Gently cleanse wound with antibacterial soap, rinse and pat dry prior to dressing wounds Cleanser: Wound Cleanser 1 x Per Week/30 Days Discharge Instructions: Wash your hands with soap and water. Remove old dressing, discard into plastic bag and place into trash. Cleanse the wound with Wound Cleanser prior to applying a clean dressing using gauze sponges, not tissues or cotton balls. Do not scrub or use excessive force. Pat dry  using gauze sponges, not tissue or cotton balls. Peri-Wound Care: Moisturizing Lotion 1 x Per Week/30 Days Discharge Instructions: Suggestions: Theraderm, Eucerin, Cetaphil, or patient preference. Primary Dressing: Silvercel Small 2x2 (in/in) 1 x Per Week/30 Days Discharge Instructions: Apply Silvercel Small 2x2 (in/in) as instructed Compression Wrap: 3-LAYER WRAP - Profore Lite LF 3 Multilayer Compression Bandaging System 1 x Per Week/30 Days Discharge Instructions: Apply 3 multi-layer wrap as prescribed. Compression Stockings: Circaid Juxta Lite Compression Wrap Left Leg Compression Amount: 30-40 mmHG Discharge Instructions: Apply Circaid Juxta Lite Compression Wrap as directed Wound #4 - Lower Leg Wound Laterality: Right, Lateral [name], [name] ([id number]) Cleanser: Soap and Water 1 x Per Week/30 Days Discharge Instructions: Gently cleanse wound with antibacterial soap, rinse and pat dry prior to dressing wounds Cleanser: Wound Cleanser 1 x Per Week/30 Days Discharge Instructions: Wash your hands with soap and water. Remove old dressing, discard into plastic bag and place into trash. Cleanse the wound with Wound Cleanser prior to applying a clean dressing using gauze sponges, not tissues or cotton balls. Do not scrub or use excessive force. Pat dry using gauze sponges, not tissue or cotton balls. Peri-Wound Care: Desitin Maximum Strength Ointment 4 (oz) 1 x Per Week/30 Days Discharge Instructions: Apply around wound edges to avoid maceration Primary Dressing: Silvercel Small 2x2 (in/in) 1 x Per Week/30 Days Discharge Instructions: Apply Silvercel Small 2x2 (in/in) as instructed Secondary Dressing: ABD Pad 5x9 (in/in) 1 x Per Week/30 Days Discharge Instructions: Cover with ABD pad Compression Wrap: 3-LAYER WRAP - Profore Lite LF 3 Multilayer Compression Bandaging System 1 x Per Week/30 Days Discharge Instructions: Apply 3 multi-layer wrap as prescribed. Compression Stockings: Circaid  Juxta Lite Compression Wrap Right Leg Compression Amount: 20-30 mmHG Discharge Instructions: Apply Circaid Juxta Lite Compression Wrap as directed Electronic Signature(s) Signed: 02/04/2022 5:10:20 PM By: Gretta Cool, BSN,  RN, CWS, Kim RN, BSN Signed: 02/04/2022 5:18:35 PM By: Worthy Keeler PA-C Entered By: Gretta Cool BSN, RN, CWS, Kim on 02/04/2022 15:05:15 [name] ([id number]) -------------------------------------------------------------------------------- Problem List Details Patient Name: [name] Date of Service: 02/04/2022 2:00 PM Medical Record Number: [id number] Patient Account Number: [id number] Date of Birth/Sex: [birth year] (76 y.o. 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 F) Treating RN: Cornell Barman Primary Care Provider: [name] Other Clinician: Referring Provider: [name] Treating  Provider/Extender: Skipper Cliche in Treatment: 62 Subjective Chief Complaint Information obtained from Patient Bilateral LE Ulcers History of Present Illness (HPI) 03/15/2020 upon evaluation today patient presents today for initial evaluation here in our clinic concerning a wound that is on the left posterior lower extremity. Unfortunately this has been giving the patient some discomfort at this point which she notes has been affected her pretty much daily. The patient does have a history of venous insufficiency, hypertension, congestive heart failure, and a history of breast cancer. Fortunately there does not appear to be evidence of active infection at this time which is great news. No fevers, chills, nausea, vomiting, or diarrhea. Wound is not extremely large but does have some slough covering the surface of the wound. This is going require sharp debridement today. 03/15/2020 upon evaluation today patient appears to be doing fairly well in regard to her wound. She does have some slough noted on the surface of the wound currently but I do believe the Iodoflex has been beneficial over the past week. There is no signs of active infection at this time which is great news and overall very pleased with the progress. No fevers, chills, nausea, vomiting, or diarrhea. 04/02/2020 upon evaluation today patient appears to be doing a little better in regard to her wound size wise this is not tremendously smaller she does have some slough buildup on the surface of the wound. With that being said this is going require some sharp debridement today to clear away some of the necrotic debris. Fortunately there is no evidence of active infection at this time. No fevers, chills, nausea, vomiting, or diarrhea. 04/10/2020 on evaluation today patient appears to be doing well with regard to her ulcer which is measuring somewhat smaller today. She actually has more granulation tissue noted which is also good news is no  need for sharp debridement today. Fortunately there is no evidence of infection either which is also excellent. No fevers, chills, nausea, vomiting, or diarrhea. 04/26/2020 on evaluation today patient's wound actually is showing signs of good improvement which is great news. There does not appear to be any evidence of active infection. I do believe she is tolerating the collagen at this point.  05/03/2020 upon evaluation today patient's wound actually showed signs of good granulation at this time there does not appear to be any evidence of active infection which is great news and overall very pleased with where things stand. With that being said I do believe that the patient is can require some sharp debridement today but fortunately nothing too significant. 05/11/20 on evaluation today patient appears to be doing well in regard to her leg ulcer. She's been tolerating the dressing changes without complication. Fortunately there is no signs of active infection at this time. Overall I'm very pleased with where things stand. 05/18/2020 upon evaluation today patient's wound is showing signs of improvement is very dry and I think the collagen for the most part just dried to the wound bed. I am going to clean this away with sharp debridement in order to get this under control in my opinion. 05/25/2020 upon evaluation today patient actually appears to be doing well in regard to her leg ulcer. In fact upon inspection it appears she could potentially be completely healed but that is not guaranteed based on what we are seeing. There does not appear to be any signs of active infection which is great news. 05/31/2020 on evaluation today patient appears to be doing well with regard to her wounds. She in fact appears to be almost completely healed on the left leg there is just a very small opening remaining point 1 06/08/2020 upon evaluation today patient appears to be doing excellent in regard to her leg ulcer on the left  in fact it appears to be that she is completely healed as of today. I see no signs of active infection at this time which is great news. No fevers, chills, nausea, vomiting, or diarrhea. READMISSION 01/16/2021 Patient that we have had in this clinic with a wound on the left posterior calf in the summer 2021 extending into October. She was felt to have chronic venous insufficiency. Per the patient's description she was discharged with what sounds like external compression stockings although she could not afford the "$75 per leg". She therefore did not get anything. Apparently her wound that she has currently is been in existence since January. She has been followed at vein and vascular with Unna boots and I think calcium alginate. She had ABIs done on 06/25/2020 that showed an noncompressible ABI on the right leg and the left leg but triphasic waveforms with good great toe pressures and a TBI of 0.90 on the right and 0.91 on the left Surprisingly the patient also had venous reflux studies that were really unremarkable. This included no evidence of DVTs or SVTs but there was no evidence of deep venous insufficiency or superficial venous insufficiency in the greater saphenous or short saphenous veins bilaterally. I would wonder about more central venous issues or perhaps this is all lymphedema 01/25/2021 upon evaluation today patient appears to be doing decently well and guard to her wound. The good news is the Iodoflex that is job she saw Dr. Dellia Nims last week for readmission and to be honest I think that this has done extremely well over that week. I do believe that she can tolerate the sharp debridement today which will be good. I think that cleaning off the wound will likely allow [place] to be able to use a different type of dressing I do not think the Iodoflex will even be necessary based on how clean the wound looks. Fortunately there is no sign of active infection at this time which  is great news. No  fevers, chills, nausea, vomiting, or diarrhea. MOMOKO, SLEZAK ([id number]) 02/12/2021 upon evaluation today patient appears to be doing well with regard to her leg ulcer. We did switch to alginate when she came for nurse visit I think is doing much better for her. Fortunately there does not appear to be any signs of active infection at this time which is great news. No fevers, chills, nausea, vomiting, or diarrhea. 02/18/2021 upon evaluation today patient's wound is actually showing signs of good improvement. I am very pleased with where things stand currently. I do not see any signs of active infection which is great news and overall I feel like she is making good progress. The patient likewise is happy that things are doing so well and that this is measuring somewhat smaller. 02/25/2021 upon evaluation today patient actually appears to be doing quite well in regard to her wounds. She has been tolerating the dressing changes without complication. Fortunately there does not appear to be any signs of active infection which is great news and overall I am extremely pleased with where things stand. No fevers, chills, nausea, vomiting, or diarrhea. She does have a lot of edema I really do think she needs compression socks to be worn in order to prevent things from causing additional issues for her currently. Especially on the right leg she should be wearing compression socks all the time to be honest. 03/04/2021 upon evaluation today patient's leg ulcer actually appears to be doing pretty well which is great news. There does not appear to be any significant change but overall the appearance is better even though the size is not necessarily reflecting a great improvement. Fortunately there does not appear to be any signs of active infection. No fevers, chills, nausea, vomiting, or diarrhea. 03/12/2021 upon evaluation today patient appears to be doing well with regard to her wounds. She has been tolerating the  dressing changes without complication. The good news is that the wound on her left lateral leg is doing much better and is showing signs of good improvement. Overall her swelling is controlled well as well. She does need some compression socks she plans to go Jefferey socks next week. 03/18/2021 on evaluation today patient's wound actually is showing signs of good improvement. She does have a little bit of slough this can require some sharp debridement today. 03/25/2021 upon evaluation today patient appears to be doing well with regard to her wound on the left lateral leg. She has been tolerating the dressing changes without complication. Fortunately there does not appear to be any signs of active infection at this time. No fever chills no 04/01/2021 upon evaluation today patient's wound is showing signs of improvement and overall very pleased with where things stand at this point. There is no evidence of active infection which is great news as well and in general I think that she is making great progress. 04/09/2021 upon evaluation today patient appears to be doing well with regard to her ankle ulcer. She is making good progress currently which is great news. There does not appear to be any signs of infection also excellent news. In general I am extremely pleased with where things stand today. No fevers, chills, nausea, vomiting, or diarrhea. 04/23/2021 upon inspection today patient appears to be doing quite well with regard to her leg ulcer. She has been tolerating the dressing changes without complication. She is can require some sharp debridement today but overall seems to be doing excellent. 05/06/2021 upon evaluation  today patient unfortunately appears to be doing poorly overall in regard to her swelling. She is actually significantly more swollen than last week despite the fact that her wound is doing better this is not a good trend. I think that she probably needs to see her cardiologist ASAP and I  discussed that with her today. This includes the fact that honestly I think she probably is getting need an increase in her diuretic or something to try to clear away some of the excess fluid that she is experiencing at the moment. She is not been doing her pumps even twice a day but again right now more concerned that if she were to do the pump she would fluid overload her lungs causing other issues as far as her breathing is concerned. Obviously I think she needs to contact them and move up the appointment for the 12th of something sooner 2 weeks is a bit too long to wait with how swollen she has. 05/14/2021 upon evaluation today patient's wound actually showing signs of being a little bit smaller compared to previous. She has been making good progress however. Fortunately there does not appear to be any evidence of active infection which is great. Overall I am extremely pleased in that regard. Nonetheless I am still concerned that she is having quite a bit of issue as far as the swelling is concerned she has been placed on fluid restriction as well as an increase in the furosemide by her doctor. We will see how things progress. 05/21/2021 upon evaluation today patient appears to be doing well with regard to her wound in general. She has been tolerating the dressing changes without complication. With that being said she has been using silver cell for a while and while this was showing signs of improvement it is now gotten to the point where this has slowed down quite significantly. I do believe that switching to a different dressing may be beneficial for her today. 05/28/2021 upon evaluation today patient actually appears to be doing quite well in regard to her wound. In fact there is really not any need for sharp debridement today based on what I see. The Hydrofera Blue is doing great and overall I think that she is managing quite nicely with the compression wrapping. Her edema is still down quite a  bit with the wrapping in place. 06/11/2021 upon evaluation today patient appears to be doing well with regard to her leg ulcer. She has been tolerating the dressing changes without complication. Fortunately there does not appear to be any signs of active infection at this time which is great news. No fevers, chills, nausea, vomiting, or diarrhea. She is going require some sharp debridement today. 10/11; left leg ulcer much better looking and with improved surface area. She is using Hydrofera Blue under 3 layer compression. She does not have any stocking on the right leg which in itself has very significant nonpitting edema 06/25/2021 upon evaluation today patient appears to be doing well with regard to the wound on her left leg. She has been tolerating the dressing changes without complication and overall I am extremely pleased with where things stand today. I do not see any evidence of infection which is great news. 07/02/2021 upon evaluation today patient's wound actually showing signs of excellent improvement and actually very pleased with where we stand today and the overall appearance. Fortunately there does not appear to be any signs of active infection. No fevers, chills, nausea, vomiting, or diarrhea. 07/09/2021 upon  evaluation today patient appears to be doing better in regard to her wound this is measuring smaller and she is headed in the appropriate direction based on what I am seeing currently. I do not see any evidence of active infection systemically which is great news. 07/16/2021 upon evaluation today patient appears to be doing pretty well currently in regard to her leg ulcer. This is measuring smaller and looking much better this is great news. Fortunately there does not appear to be any evidence of active infection systemically which is great news [name], [name] ([id number]) as well. 07/23/2021 upon evaluation today patient's wound is actually showing signs of good improvement  this is definitely measuring smaller. I do not see any signs of infection which is great news and overall very pleased in that regard. Overall I think that she is doing well with the [facility]. 07/30/2020 upon evaluation today patient appears to be doing well in regard to her wound. She has been tolerating the dressing changes without complication think the Hydrofera Blue at this point is actually causing her to dry out a lot as far as the wound bed is concerned. Subsequently I think that we need to try to see what we can do to improve this. Fortunately there does not appear to be any evidence of active infection at this time which is great news. 08/06/2021 upon evaluation today patient appears to be doing well with regards to her wound. Is not measuring significantly smaller initial inspection but upon closer inspection she has a lot of new skin growth across the central portion of the wound. I think will get very close to complete resolution. 08/13/2021 upon evaluation today patient appears to be doing well with regard to her wound. In fact this appears to be I believe healed although there is still some question as to whether it is completely so. I am concerned about the fact that to be honest she still has some evidence of dry skin that could be just trapping something underneath I still want to debride anything as I am afraid of causing some damage to the good skin for that reason I Georgina Peer probably monitor for 1 more week before completely closing everything out. 08/20/2021 upon evaluation today patient actually appears to be doing excellent. In fact she appears to be completely healed. This was the case last week as well we just wanted to make sure nothing reopened since she does not really have an ability to put on compression socks this is good to be something we need to make sure was well-healed. Nonetheless I think we have achieved that goal as of today. 12/27; this is a patient we  discharged 2 weeks ago with wounds on her left lower leg laterally. She was supposed to get stockings and really did not get them. She developed blistering and reopening probably sometime late last week. She has 3 areas in the same area as previously. The mid calf dimensions on the left went from 39 to 51 cm today. She also has very significant edema on the right leg but as far as I am aware has not had wounds in this area 1/4; patient presents for follow-up. She has tolerated the compression wrap well. She has no issues or complaints today. 09/17/2021 upon evaluation today patient appears to be doing well with regard to her wounds. She has been tolerating the dressing changes without complication. Fortunately there does not appear to be any signs of active infection at this time. No fevers, chills,  nausea, vomiting, or diarrhea. 10/01/2021 upon evaluation today patient appears to be doing well with regard to her wound this is actually measuring better and looking smaller and I am very pleased in that regard. Fortunately I do not see any evidence of active infection locally nor systemically at this point. No fevers, chills, nausea, vomiting, or diarrhea. 10/08/2021 upon evaluation patient's wounds are actually showing some signs of improvement measuring a little bit smaller and looking a little bit better. Overall I do not think there is any need for sharp debridement today which is good news. No fevers, chills, nausea, vomiting, or diarrhea. 10/14/2021 upon evaluation today patient appears to be doing well with regard to her wound. She has been tolerating the dressing changes without complication and overall I am extremely pleased with where things stand today. There is a little bit of film on the surface of the wounds but this was carefully cleaned away with just saline and gauze she really did not want me to do any sharp debridement today. 10/29/2021 upon evaluation today patient appears to be doing  quite well in regard to her wounds. She is actually showing signs of excellent improvement which is great news and overall I feel like we are on the right track at this time. She does have a wound on both legs and she does need something compression wise when she heals for that reason we will get a go and see about ordering her bilateral juxta lite compression wraps. 11/05/2021 upon evaluation today patient appears to be doing well with regard to her wound. She has been tolerating the dressing changes without complication. Fortunately I do not see any evidence of active infection locally nor systemically at this time which is great news. No fevers, chills, nausea, vomiting, or diarrhea. 11/12/2021 upon evaluation today patient appears to be doing well with regard to her wounds. She is doing excellent on the left on the right this is not doing quite as well there is some slough and biofilm buildup. 3/14; patient presents for follow-up. She has no issues or complaints today. She has tolerated the compression wraps well. 11/26/2021 upon evaluation today patient appears to be doing well with regard to her wounds. Both are showing signs of good improvement which is great news. I do not see any evidence of active infection and overall think she is healing quite nicely. 12/03/2021 upon evaluation today patient appears to be doing well with regard to her wounds. Both are showing signs of excellent improvement I am very pleased with where things stand and I think that she is making good progress here. Fortunately I do not see any signs of active infection locally or systemically which is great news. No fevers, chills, nausea, vomiting, or diarrhea. 12-10-2021 upon evaluation today patient's wounds actually appear to be doing awesome. I am extremely pleased with where we stand I think she is making excellent progress. I do not see any signs of active infection locally or systemically at this time. 12-17-2021 upon  evaluation today patient's wounds are actually showing signs of good improvement. Fortunately I do not see any evidence of active infection locally nor systemically which is great news. Overall I do not believe that she is showing any signs of significant worsening which is good news. With that being said I do believe that she has a little bit of film on the surface of the wound I was actually able to clear this away with saline and gauze no sharp debridement was necessary.  12-24-2021 upon evaluation today patient appears to be doing well with regard to her wounds. I do not see any signs of active infection currently which is great news and overall I think that we are on the right track here. No fevers, chills, nausea, vomiting, or diarrhea. 12-31-2021 upon evaluation today patient appears to be doing well with regard to her wounds. Both are showing signs of improvement the wound [name], [name] ([id number]) on the right leg is going require some sharp debridement on the left seems to be doing quite well. 01-07-2022 upon evaluation today patient appears to be doing well currently in regard to her wounds. She is actually making good progress bilaterally. Both of them require little bit of sharp debridement but not too much which is great news. Upon inspection patient's wound bed actually showed signs of good granulation and epithelization on both legs. She still is having significant issues with the right leg compared to the left although I do feel like both are actually showing signs of pretty good improvement. 01-21-2022 upon evaluation today patient's wound is actually showing signs of excellent improvement I am very pleased with where we stand currently. I do not see any signs of active infection locally or systemically which is great news. No fevers, chills, nausea, vomiting, or diarrhea. 01-28-2022 upon evaluation today patient appears to be doing well with regard to her wound on the left leg this is  pretty much just about healed which is great news. Unfortunately in regard to the wound on her right leg this is not doing nearly as well in fact I think we probably need to see about a culture today that was discussed with her. 02-04-2022 upon evaluation today patient appears to be doing well with regard to her left leg which in fact might actually be completely healed I am not 100% sure. Nonetheless this is shown signs of excellent improvement which is great news. With that being said in regards to the right leg there is some slough and film buildup on the surface of the wound although she is feeling much better than last week this does appear to be doing much better than it was last week. Fortunately there does not appear to be any signs of active infection locally or systemically at this time. Objective Constitutional Obese and well-hydrated in no acute distress. Vitals Time Taken: 2:11 PM, Height: 69 in, Weight: 244 lbs, BMI: 36, Temperature: 98.2 F, Pulse: 55 bpm, Respiratory Rate: 16 breaths/min, Blood Pressure: 120/59 mmHg. Respiratory normal breathing without difficulty. Psychiatric this patient is able to make decisions and demonstrates good insight into disease process. Alert and Oriented x 3. pleasant and cooperative. General Notes: Upon inspection patient's wound does appear to be doing much better on the left in fact this may be closed on the right the infection appears to be significantly improved. I am still can avoid any debridement today due to the fact that this is just starting to get better and already there is a lot of general improvement here. I do think we will continue to monitor and keep a close eye on things nonetheless. Integumentary (Hair, Skin) Wound #3 status is Open. Original cause of wound was Gradually Appeared. The date acquired was: 08/15/2021. The wound has been in treatment 22 weeks. The wound is located on the Left Lower Leg. The wound measures 0.3cm length  x 0.3cm width x 0.1cm depth; 0.071cm^2 area and 0.007cm^3 volume. There is a medium amount of serosanguineous drainage noted. Wound #4 status  is Open. Original cause of wound was Gradually Appeared. The date acquired was: 10/27/2021. The wound has been in treatment 14 weeks. The wound is located on the Right,Lateral Lower Leg. The wound measures 5.6cm length x 7cm width x 0.1cm depth; 30.788cm^2 area and 3.079cm^3 volume. There is a medium amount of serosanguineous drainage noted. 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 There was a Three Layer Compression Therapy Procedure by Cornell Barman, RN. Post procedure Diagnosis Wound #3: Same as Pre-Procedure Notes: Patient tolerating wraps well.. Wound #4 Pre-procedure diagnosis of Wound #4 is a Venous Leg Ulcer located on the Right,Lateral Lower Leg . There was a Three Layer Compression Therapy Procedure by Cornell Barman, RN. Post procedure Diagnosis Wound #4: Same as Pre-Procedure Notes: Patient tolerating wraps well.. Plan Follow-up Appointments: Return Appointment in 1 week. Nurse Visit as needed Bathing/ Shower/ Hygiene: May shower with wound dressing protected with water repellent cover or cast protector. No tub bath. Anesthetic (Use 'Patient Medications' Section for Anesthetic Order Entry): Lidocaine applied to wound bed Edema Control - Lymphedema / Segmental Compressive Device / Other: Optional: One layer of unna paste to top of compression wrap (to act as an anchor). - she needs this Elevate, Exercise Daily and Avoid  Standing for Long Periods of Time. Elevate legs to the level of the heart and pump ankles as often as possible Elevate leg(s) parallel to the floor when sitting. Compression Pump: Use compression pump on left lower extremity for 60 minutes, twice daily. - You may pump over wraps Compression Pump: Use compression pump on right lower extremity for 60 minutes, twice daily. - You may pump over wraps Additional Orders / Instructions: Follow Nutritious Diet and Increase Protein Intake WOUND #3: - Lower Leg Wound Laterality: Left Cleanser: Soap and Water 1 x Per Week/30 Days Discharge Instructions: Gently cleanse wound with antibacterial soap, rinse and pat dry prior to dressing wounds Cleanser: Wound Cleanser 1 x Per Week/30 Days Discharge Instructions: Wash your hands with soap and water. Remove old dressing, discard into plastic bag and place into trash. Cleanse the wound with Wound Cleanser prior to applying a clean dressing using gauze sponges, not tissues or cotton balls. Do not scrub or use excessive force. Pat dry using gauze sponges, not tissue or cotton balls. Peri-Wound Care: Moisturizing Lotion 1 x Per Week/30 Days Discharge Instructions: Suggestions: Theraderm, Eucerin, Cetaphil, or patient preference. Primary Dressing: Silvercel Small 2x2 (in/in) 1 x Per Week/30 Days Discharge Instructions: Apply Silvercel Small 2x2 (in/in) as instructed Compression Wrap: 3-LAYER WRAP - Profore Lite LF 3 Multilayer Compression Bandaging System 1 x Per Week/30 Days Discharge Instructions: Apply 3 multi-layer wrap as prescribed. Compression Stockings: Circaid Juxta Lite Compression Wrap Compression Amount: 30-40 mmHg (left) Discharge Instructions: Apply Circaid Juxta Lite Compression Wrap as directed WOUND #4: - Lower Leg Wound Laterality: Right, Lateral Cleanser: Soap and Water 1 x Per Week/30 Days Discharge Instructions: Gently cleanse wound with antibacterial soap, rinse and pat dry prior to  dressing wounds Cleanser: Wound Cleanser 1 x Per Week/30 Days Discharge Instructions: Wash your hands with soap and water. Remove old dressing, discard into plastic bag and place  into trash. Cleanse the wound with Wound Cleanser prior to applying a clean dressing using gauze sponges, not tissues or cotton balls. Do not scrub or use excessive force. Pat dry using gauze sponges, not tissue or cotton balls. Peri-Wound Care: Desitin Maximum Strength Ointment 4 (oz) 1 x Per Week/30 Days Discharge Instructions: Apply around wound edges to avoid maceration Primary Dressing: Silvercel Small 2x2 (in/in) 1 x Per Week/30 Days Discharge Instructions: Apply Silvercel Small 2x2 (in/in) as instructed Secondary Dressing: ABD Pad 5x9 (in/in) 1 x Per Week/30 Days Discharge Instructions: Cover with ABD pad Compression Wrap: 3-LAYER WRAP - Profore Lite LF 3 Multilayer Compression Bandaging System 1 x Per Week/30 Days Discharge Instructions: Apply 3 multi-layer wrap as prescribed. Compression Stockings: Circaid Juxta Lite Compression Wrap Compression Amount: 20-30 mmHg (right) Discharge Instructions: Apply Circaid Juxta Lite Compression Wrap as directed [name], [name] ([id number]) 1. Based on what I am seeing currently I do believe that the patient would benefit from continuing with the silver alginate dressing followed by an ABD pad and 3 layer compression wrap on the right leg this is doing quite well. 2. We will continue with the 3 layer wrap on the left leg as well although I believe this is very close to complete closure. 3. I would also suggest the patient continue to elevate her legs is much as possible to help with edema control I think this is still good to be of utmost importance going forward. We will see patient back for reevaluation in 1 week here in the clinic. If anything worsens or changes patient will contact our office for additional recommendations. Electronic Signature(s) Signed: 02/04/2022  3:09:38 PM By: Worthy Keeler PA-C Entered By: Worthy Keeler on 02/04/2022 15:09:37 [name] ([id number]) -------------------------------------------------------------------------------- SuperBill Details Patient Name: [name] Date of Service: 02/04/2022 Medical Record Number: [id number] Patient Account Number: [id number] Date of Birth/Sex: 1946/08/31 (76 y.o. F) Treating RN: Cornell Barman Primary Care Provider: [name] Other Clinician: Referring Provider: [name] Treating Provider/Extender: Skipper Cliche in Treatment: 54 Diagnosis Coding ICD-10 Codes Code Description I89.0 Lymphedema, not elsewhere classified I87.332 Chronic venous hypertension (idiopathic) with ulcer and inflammation of left lower extremity L97.822 Non-pressure chronic ulcer of other part of left lower leg with fat layer exposed L97.812 Non-pressure chronic ulcer of other part of right lower leg with fat layer exposed Facility Procedures CPT4: Description Modifier Quantity Code [id number] BILATERAL: Application of multi-layer venous compression system; leg (below knee), including 1 ankle and foot. Physician Procedures CPT4 Code Description: [id number] - WC PHYS LEVEL 3 - EST PT Modifier: Quantity: 1 CPT4 Code Description: ICD-10 Diagnosis Description I89.0 Lymphedema, not elsewhere classified I87.332 Chronic venous hypertension (idiopathic) with ulcer and inflammation of L97.822 Non-pressure chronic ulcer of other part of left lower leg with fat  laye L97.812 Non-pressure chronic ulcer of other part of right lower leg with fat lay Modifier: left lower extremity r exposed er exposed Quantity: Electronic Signature(s) Signed: 02/04/2022 3:10:19 PM By: Worthy Keeler PA-C Entered By: Worthy Keeler on 02/04/2022 15:10:18

## 2022-02-09 NOTE — Progress Notes (Deleted)
Date:  02/09/2022   ID:  [name], DOB January 05, 1946, MRN [id number]  Patient Location:  [place] [address]   Provider location:   [facility], [place] office  PCP:  Donnie Coffin, MD  Cardiologist:  [facility]   No chief complaint on file.   History of Present Illness:    [name] is a 76 y.o. female past medical history of Medication confusion/Medication noncompliance No family to help, uses [employer] for transportation morbid obesity,  severe lower extremity edema, dating back to 4097 systolic CHF, ejection fraction 25% in 2012  , Up to EF 50% to 55% in 03/2017 sleep apnea, scheduled for repeat testing. Previous machine broke pulmonary disease,  severe hypertension,  likely secondary to medication noncompliance who presents For follow-up of her hypertension, cardiomyopathy, edema, chronic leg wounds followed by wound clinic  LOV 11/22,  No family with her on today's visit Weight down 10 pounds, 254 pounds down to 245 pounds On torsemide 40 BID Cut back on fluids  Presents with wraps in place on the left Still with significant bilateral leg swelling  Main complaint, Having migraines  Neuropathy in toes, Toes with pain, throb By her account, sounds like she sleeps with her toes/legs down, possibly recliner  EKG personally reviewed by myself on todays visit  nsr rate 64, LAD  Other past medical history reviewed  June 22 lab work Creatinine 1.2 BUN 29 potassium 4.0 Hemoglobin 11.2 down from 12.4  Not using her CPAP Previously received a new machine from [employer] mask  too small   hospitalization July 2018 for TIA and chest pain Slurring, head hurt, H/A, felt hot She had MRI brain without acute finding Neurology consult with no clear recommendations Chest pain resolved without intervention Carotid ultrasound without significant stenosis SNF was recommended    Past Medical History:  Diagnosis Date    Abnormality of gait    Breast cancer ([place]) 02/05/2005   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    Breast cancer ([place]) 2019   right breast   CHF (congestive heart failure) ([place])    March 28, 2017 echo: EF 50-55%; 1) echo 07/2011: EF 20-25%, mild concentric hypertrophy, diffuse HK, regional WMA cannot be excluded, grade 1 DD, mitral valve with mild regurg, LA mildly dilated). 2) EF 40-45%, mild AS,                     Complication of anesthesia 2006   pt states "hard to wake up" after L breast surgery    Edema    GERD (gastroesophageal reflux disease)    Headache    Heart murmur    Hypertension    Hypokinesis    global, EF 35-45 % from Echo.   Malignant neoplasm of breast (female), unspecified site 06/14/2018   RIGHT T1b, N0; ER/PR positive, HER-2 negative.   Neuropathy    Obesity    Pain in joint, site unspecified    Personal history of chemotherapy 2006   left breast   Personal history of radiation therapy 2019   right breast   Personal history of radiation therapy 2006   left breast   Pneumonia    Sleep apnea    Tinea pedis    Unspecified hearing loss    bilateral   Past Surgical History:  Procedure Laterality Date   BREAST BIOPSY Right 2019   11  oc-INVASIVE MAMMARY CARCINOMA, NO SPECIAL TYPE   BREAST BIOPSY Right 2019   1030 oc- neg   BREAST LUMPECTOMY  2006   left breast    CATARACT EXTRACTION     left eye   HERNIA REPAIR  05/25/2006   Incarcerated omentum in ventral hernia, Ventralight mesh, combined lap/ open with omental resection.    PARTIAL MASTECTOMY WITH NEEDLE LOCALIZATION Right 06/14/2018   Procedure: PARTIAL MASTECTOMY WITH NEEDLE LOCALIZATION;  Surgeon: Robert Bellow, MD;  Location: [facility] ORS;  Service: General;  Laterality: Right;   PORT-A-CATH REMOVAL  05/25/2006   SENTINEL NODE BIOPSY Right 06/14/2018   Procedure: SENTINEL NODE BIOPSY;  Surgeon: Robert Bellow, MD;   Location: [facility] ORS;  Service: General;  Laterality: Right;   TUBAL LIGATION       No outpatient medications have been marked as taking for the 02/10/22 encounter (Appointment) with Minna Merritts, MD.     Allergies:   Patient has no known allergies.   Social History   Tobacco Use   Smoking status: Former    Packs/day: 0.50    Years: 30.00    Pack years: 15.00    Types: Cigarettes    Quit date: 07/14/2000    Years since quitting: 21.5   Smokeless tobacco: Never  Vaping Use   Vaping Use: Never used  Substance Use Topics   Alcohol use: No   Drug use: Not Currently    Types: Marijuana    Comment: endorses prior marijuana use 1999     Family Hx: The patient's family history includes Breast cancer (age of onset: 11) in an other family member; Cataracts in her mother; Coronary artery disease in an other family member; Hypertension in her mother.  ROS:   Please see the history of present illness.    Review of Systems  Constitutional: Negative.   HENT: Negative.    Respiratory: Negative.    Cardiovascular:  Positive for leg swelling.  Gastrointestinal: Negative.   Musculoskeletal: Negative.   Neurological: Negative.   Psychiatric/Behavioral: Negative.    All other systems reviewed and are negative.   Labs/Other Tests and Data Reviewed:    Recent Labs: 08/21/2021: ALT 12; BUN 30; Creatinine, Ser 1.10; Hemoglobin 11.1; Platelets 271; Potassium 4.0; Sodium 140   Recent Lipid Panel Lab Results  Component Value Date/Time   CHOL 141 03/28/2017 06:39 AM   TRIG 40 03/28/2017 06:39 AM   HDL 42 03/28/2017 06:39 AM   CHOLHDL 3.4 03/28/2017 06:39 AM   LDLCALC 91 03/28/2017 06:39 AM    Wt Readings from Last 3 Encounters:  12/09/21 256 lb (116.1 kg)  08/05/21 245 lb (111.1 kg)  07/04/21 243 lb 8 oz (110.5 kg)     Exam:    Vital Signs: Vital signs may also be detailed in the HPI There were no vitals taken for this visit.  Constitutional:  oriented to person, place, and  time. No distress.  HENT:  Head: Grossly normal Eyes:  no discharge. No scleral icterus.  Neck: No JVD, no carotid bruits  Cardiovascular: Regular rate and rhythm, no murmurs appreciated 2+ pitting lower extremity edema Pulmonary/Chest: Clear to auscultation bilaterally, no wheezes or rails Abdominal: Soft.  no distension.  no tenderness.  Musculoskeletal: Normal range of motion Neurological:  normal muscle tone. Coordination normal. No atrophy Skin: Skin warm and dry Psychiatric: normal affect, pleasant  ASSESSMENT & PLAN:    Chronic diastolic CHF Weight down 9 to 10 pounds from prior clinic visit 1 month ago  Recommend she continue torsemide 40 twice daily, fluid restriction if she is doing Likely has additional 20 to 30 pounds of diuresis to go, goal 225 She has made progress by cutting back on her sodas, sweet tea, coffee and taking her torsemide  Morbid obesity (HCC) Unable to exercise, recommended low carbohydrates, medication compliance  SOB (shortness of breath) Stressed importance of taking her medications as above  Essential hypertension Blood pressure is well controlled on today's visit. No changes made to the medications.  OSA on CPAP Current mask on new machine does not fit her face History of noncompliance  Lymphedema Stressed compliance with her lymphedema compression pumps Leg elevation, torsemide 40 twice daily, fluid restriction    Total encounter time more than 25 minutes  Greater than 50% was spent in counseling and coordination of care with the patient   Signed, Ida Rogue, MD  02/09/2022 9:48 PM    [place] [facility] [address], [place], [place] [address]

## 2022-02-10 ENCOUNTER — Ambulatory Visit: Payer: Medicare Other | Admitting: Cardiovascular Disease

## 2022-02-10 DIAGNOSIS — I890 Lymphedema, not elsewhere classified: Secondary | ICD-10-CM

## 2022-02-10 DIAGNOSIS — R0602 Shortness of breath: Secondary | ICD-10-CM

## 2022-02-10 DIAGNOSIS — M7989 Other specified soft tissue disorders: Secondary | ICD-10-CM

## 2022-02-10 DIAGNOSIS — I739 Peripheral vascular disease, unspecified: Secondary | ICD-10-CM

## 2022-02-10 DIAGNOSIS — N183 Chronic kidney disease, stage 3 unspecified: Secondary | ICD-10-CM

## 2022-02-10 DIAGNOSIS — I10 Essential (primary) hypertension: Secondary | ICD-10-CM

## 2022-02-10 DIAGNOSIS — I5032 Chronic diastolic (congestive) heart failure: Secondary | ICD-10-CM

## 2022-02-11 ENCOUNTER — Encounter: Payer: Medicare Other | Attending: Physician Assistant | Admitting: Physician Assistant

## 2022-02-11 ENCOUNTER — Encounter: Payer: Self-pay | Admitting: Cardiovascular Disease

## 2022-02-11 DIAGNOSIS — L97812 Non-pressure chronic ulcer of other part of right lower leg with fat layer exposed: Secondary | ICD-10-CM | POA: Insufficient documentation

## 2022-02-11 DIAGNOSIS — I110 Hypertensive heart disease with heart failure: Secondary | ICD-10-CM | POA: Insufficient documentation

## 2022-02-11 DIAGNOSIS — I509 Heart failure, unspecified: Secondary | ICD-10-CM | POA: Insufficient documentation

## 2022-02-11 DIAGNOSIS — L97822 Non-pressure chronic ulcer of other part of left lower leg with fat layer exposed: Secondary | ICD-10-CM | POA: Insufficient documentation

## 2022-02-11 DIAGNOSIS — I87333 Chronic venous hypertension (idiopathic) with ulcer and inflammation of bilateral lower extremity: Secondary | ICD-10-CM | POA: Insufficient documentation

## 2022-02-11 DIAGNOSIS — I890 Lymphedema, not elsewhere classified: Secondary | ICD-10-CM | POA: Insufficient documentation

## 2022-02-11 NOTE — Progress Notes (Signed)
SELYNA, KLAHN ([id number]) Visit Report for 02/11/2022 Arrival Information Details Patient Name: [name], [name] Date of Service: 02/11/2022 10:15 AM Medical Record Number: [id number] Patient Account Number: [id number] Date of Birth/Sex: [birth year] (76 y.o. F) Treating RN: Carlene Coria Primary Care Sarinah Doetsch: [name] Other Clinician: Referring Hatice Bubel: [name] Treating Shayla Heming/Extender: Skipper Cliche in Treatment: 60 Visit Information History Since Last Visit All ordered tests and consults were completed: No Patient Arrived: Wheel Chair Added or deleted any medications: No Arrival Time: 10:10 Any new allergies or adverse reactions: No Accompanied By: self Had a fall or experienced change in No Transfer Assistance: None activities of daily living that may affect Patient Identification Verified: Yes risk of falls: Secondary Verification Process Completed: Yes Signs or symptoms of abuse/neglect since last visito No Patient Requires Transmission-Based Precautions: No Hospitalized since last visit: No Patient Has Alerts: Yes Implantable device outside of the clinic excluding No Patient Alerts: NOT DIABETIC cellular tissue based products placed in the center since last visit: Has Dressing in Place as Prescribed: Yes Pain Present Now: No Electronic Signature(s) Signed: 02/11/2022 5:00:48 PM By: Carlene Coria RN Entered By: Carlene Coria on 02/11/2022 10:22:14 [name] ([id number]) -------------------------------------------------------------------------------- Clinic Level of Care Assessment Details Patient Name: [name] Date of Service: 02/11/2022 10:15 AM Medical Record Number: [id number] Patient Account Number: [id number] Date of Birth/Sex: 1946-07-05 (76 y.o. F) Treating RN: Carlene Coria Primary Care Brylea Pita: [name] Other Clinician: Referring Elizabeht Suto: [name] Treating Filbert Craze/Extender: Skipper Cliche in Treatment: 55 Clinic Level  of Care Assessment Items TOOL 4 Quantity Score []  - Use when only an EandM is performed on FOLLOW-UP visit 0 ASSESSMENTS - Nursing Assessment / Reassessment []  - Reassessment of Co-morbidities (includes updates in patient status) 0 []  - 0 Reassessment of Adherence to Treatment Plan ASSESSMENTS - Wound and Skin Assessment / Reassessment []  - Simple Wound Assessment / Reassessment - one wound 0 []  - 0 Complex Wound Assessment / Reassessment - multiple wounds []  - 0 Dermatologic / Skin Assessment (not related to wound area) ASSESSMENTS - Focused Assessment []  - Circumferential Edema Measurements - multi extremities 0 []  - 0 Nutritional Assessment / Counseling / Intervention []  - 0 Lower Extremity Assessment (monofilament, tuning fork, pulses) []  - 0 Peripheral Arterial Disease Assessment (using hand held doppler) ASSESSMENTS - Ostomy and/or Continence Assessment and Care []  - Incontinence Assessment and Management 0 []  - 0 Ostomy Care Assessment and Management (repouching, etc.) PROCESS - Coordination of Care []  - Simple Patient / Family Education for ongoing care 0 []  - 0 Complex (extensive) Patient / Family Education for ongoing care []  - 0 Staff obtains Programmer, systems, Records, Test Results / Process Orders []  - 0 Staff telephones HHA, Nursing Homes / Clarify orders / etc []  - 0 Routine Transfer to another Facility (non-emergent condition) []  - 0 Routine Hospital Admission (non-emergent condition) []  - 0 New Admissions / Biomedical engineer / Ordering NPWT, Apligraf, etc. []  - 0 Emergency Hospital Admission (emergent condition) []  - 0 Simple Discharge Coordination []  - 0 Complex (extensive) Discharge Coordination PROCESS - Special Needs []  - Pediatric / Minor Patient Management 0 []  - 0 Isolation Patient Management []  - 0 Hearing / Language / Visual special needs []  - 0 Assessment of Community assistance (transportation, D/C planning, etc.) []  - 0 Additional  assistance / Altered mentation []  - 0 Support Surface(s) Assessment (bed, cushion, seat, etc.) INTERVENTIONS - Wound Cleansing / Measurement Goldammer, Abrianna ([id number]) []  - 0 Simple Wound Cleansing - one wound []  -  0 Complex Wound Cleansing - multiple wounds []  - 0 Wound Imaging (photographs - any number of wounds) []  - 0 Wound Tracing (instead of photographs) []  - 0 Simple Wound Measurement - one wound []  - 0 Complex Wound Measurement - multiple wounds INTERVENTIONS - Wound Dressings []  - Small Wound Dressing one or multiple wounds 0 []  - 0 Medium Wound Dressing one or multiple wounds []  - 0 Large Wound Dressing one or multiple wounds []  - 0 Application of Medications - topical []  - 0 Application of Medications - injection INTERVENTIONS - Miscellaneous []  - External ear exam 0 []  - 0 Specimen Collection (cultures, biopsies, blood, body fluids, etc.) []  - 0 Specimen(s) / Culture(s) sent or taken to Lab for analysis []  - 0 Patient Transfer (multiple staff / Civil Service fast streamer / Similar devices) []  - 0 Simple Staple / Suture removal (25 or less) []  - 0 Complex Staple / Suture removal (26 or more) []  - 0 Hypo / Hyperglycemic Management (close monitor of Blood Glucose) []  - 0 Ankle / Brachial Index (ABI) - do not check if billed separately []  - 0 Vital Signs Has the patient been seen at the hospital within the last three years: Yes Total Score: 0 Level Of Care: ____ Electronic Signature(s) Signed: 02/11/2022 5:00:48 PM By: Carlene Coria RN Entered By: Carlene Coria on 02/11/2022 11:00:57 [name] ([id number]) -------------------------------------------------------------------------------- Compression Therapy Details Patient Name: [name] Date of Service: 02/11/2022 10:15 AM Medical Record Number: [id number] Patient Account Number: [id number] Date of Birth/Sex: April 23, 1946 (76 y.o. F) Treating RN: Carlene Coria Primary Care Garrick Midgley: [name] Other  Clinician: Referring Damali Broadfoot: [name] Treating Deeana Atwater/Extender: Skipper Cliche in Treatment: 55 Compression Therapy Performed for Wound Assessment: Wound #4 Right,Lateral Lower Leg Performed By: Clinician Carlene Coria, RN Compression Type: Three Layer Post Procedure Diagnosis Same as Pre-procedure Electronic Signature(s) Signed: 02/11/2022 5:00:48 PM By: Carlene Coria RN Entered By: Carlene Coria on 02/11/2022 10:57:08 [name] ([id number]) -------------------------------------------------------------------------------- Compression Therapy Details Patient Name: [name] Date of Service: 02/11/2022 10:15 AM Medical Record Number: [id number] Patient Account Number: [id number] Date of Birth/Sex: [birth year] (76 y.o. F) Treating RN: Carlene Coria Primary Care Corben Auzenne: [name] Other Clinician: Referring Khylen Riolo: [name] Treating Myiesha Edgar/Extender: Skipper Cliche in Treatment: 55 Compression Therapy Performed for Wound Assessment: Wound #3 Left Lower Leg Performed By: Clinician Carlene Coria, RN Compression Type: Three Layer Post Procedure Diagnosis Same as Pre-procedure Electronic Signature(s) Signed: 02/11/2022 5:00:48 PM By: Carlene Coria RN Entered By: Carlene Coria on 02/11/2022 10:57:28 [name] ([id number]) -------------------------------------------------------------------------------- Encounter Discharge Information Details Patient Name: [name] Date of Service: 02/11/2022 10:15 AM Medical Record Number: [id number] Patient Account Number: [id number] Date of Birth/Sex: 1945/12/21 (76 y.o. F) Treating RN: Carlene Coria Primary Care Lamis Behrmann: [name] Other Clinician: Referring Kaelah Hayashi: [name] Treating Ysenia Filice/Extender: Skipper Cliche in Treatment: 2 Encounter Discharge Information Items Discharge Condition: Stable Ambulatory Status: Wheelchair Discharge Destination: Home Transportation: Private Auto Accompanied  By: self Schedule Follow-up Appointment: Yes Clinical Summary of Care: Electronic Signature(s) Signed: 02/11/2022 5:00:48 PM By: Carlene Coria RN Entered By: Carlene Coria on 02/11/2022 11:01:58 [name] ([id number]) -------------------------------------------------------------------------------- Lower Extremity Assessment Details Patient Name: [name] Date of Service: 02/11/2022 10:15 AM Medical Record Number: [id number] Patient Account Number: [id number] Date of Birth/Sex: [birth year] (76 y.o. F) Treating RN: Carlene Coria Primary Care Brogan Martis: [name] Other Clinician: Referring Jakarri Lesko: [name] Treating Zakariyya Helfman/Extender: Skipper Cliche in Treatment: 55 Edema Assessment Assessed: [Left: No] [Right: No] Edema: [Left: Yes] [Right: Yes]  Calf Left: Right: Point of Measurement: 31 cm From Medial Instep 37.5 cm 39 cm Ankle Left: Right: Point of Measurement: 10 cm From Medial Instep 26 cm 27 cm Vascular Assessment Pulses: Dorsalis Pedis Palpable: [Left:Yes] [Right:Yes] Electronic Signature(s) Signed: 02/11/2022 5:00:48 PM By: Carlene Coria RN Entered By: Carlene Coria on 02/11/2022 10:36:27 [name] ([id number]) -------------------------------------------------------------------------------- Multi Wound Chart Details Patient Name: [name] Date of Service: 02/11/2022 10:15 AM Medical Record Number: [id number] Patient Account Number: [id number] Date of Birth/Sex: 1946/06/19 (76 y.o. F) Treating RN: Carlene Coria Primary Care Kelsie Zaborowski: [name] Other Clinician: Referring Adalyna Godbee: [name] Treating Deem Marmol/Extender: Skipper Cliche in Treatment: 55 Vital Signs Height(in): 69 Pulse(bpm): 75 Weight(lbs): 244 Blood Pressure(mmHg): 131/75 Body Mass Index(BMI): 36 Temperature(F): 98.2 Respiratory Rate(breaths/min): 18 Photos: [3:No Photos] [N/A:N/A] Wound Location: Left Lower Leg Right, Lateral Lower Leg N/A Wounding Event:  Gradually Appeared Gradually Appeared N/A Primary Etiology: Lymphedema Venous Leg Ulcer N/A Comorbid History: Cataracts, Lymphedema, Sleep Cataracts, Lymphedema, Sleep N/A Apnea, Congestive Heart Failure, Apnea, Congestive Heart Failure, Hypertension, Osteoarthritis, Hypertension, Osteoarthritis, Neuropathy, Received Neuropathy, Received Chemotherapy, Received Radiation Chemotherapy, Received Radiation Date Acquired: 08/15/2021 10/27/2021 N/A Weeks of Treatment: 23 15 N/A Wound Status: Open Open N/A Wound Recurrence: No No N/A Measurements L x W x D (cm) 0x0x0 6x6.2x0.1 N/A Area (cm) : 0 29.217 N/A Volume (cm) : 0 2.922 N/A % Reduction in Area: 100.00% -2380.20% N/A % Reduction in Volume: 100.00% -2376.30% N/A Classification: Full Thickness Without Exposed Full Thickness Without Exposed N/A Support Structures Support Structures Exudate Amount: None Present Medium N/A Exudate Type: N/A Serosanguineous N/A Exudate Color: N/A red, brown N/A Granulation Amount: None Present (0%) Medium (34-66%) N/A Granulation Quality: N/A Pink N/A Necrotic Amount: None Present (0%) Medium (34-66%) N/A Exposed Structures: Fascia: No Fascia: No N/A Fat Layer (Subcutaneous Tissue): Fat Layer (Subcutaneous Tissue): No No Tendon: No Tendon: No Muscle: No Muscle: No Joint: No Joint: No Bone: No Bone: No Epithelialization: Large (67-100%) N/A N/A Treatment Notes Electronic Signature(s) Signed: 02/11/2022 5:00:48 PM By: Carlene Coria RN Entered By: Carlene Coria on 02/11/2022 10:36:52 [name], [name] ([id number]) [name], [name] ([id number]) -------------------------------------------------------------------------------- Multi-Disciplinary Care Plan Details Patient Name: [name] Date of Service: 02/11/2022 10:15 AM Medical Record Number: [id number] Patient Account Number: [id number] Date of Birth/Sex: [birth year] (76 y.o. F) Treating RN: Carlene Coria Primary Care Jiayi Lengacher: [name]  Other Clinician: Referring Tremayne Sheldon: [name] Treating Pete Schnitzer/Extender: Skipper Cliche in Treatment: 55 Active Inactive Necrotic Tissue Nursing Diagnoses: Impaired tissue integrity related to necrotic/devitalized tissue Knowledge deficit related to management of necrotic/devitalized tissue Goals: Necrotic/devitalized tissue will be minimized in the wound bed Date Initiated: 02/04/2022 Target Resolution Date: 02/04/2022 Goal Status: Active Patient/caregiver will verbalize understanding of reason and process for debridement of necrotic tissue Date Initiated: 02/04/2022 Target Resolution Date: 02/04/2022 Goal Status: Active Interventions: Assess patient pain level pre-, during and post procedure and prior to discharge Provide education on necrotic tissue and debridement process Treatment Activities: Apply topical anesthetic as ordered : 02/04/2022 Excisional debridement : 02/04/2022 Notes: Pain, Acute or Chronic Nursing Diagnoses: Pain Management - Non-cyclic Acute (Procedural) Goals: Patient will verbalize adequate pain control and receive pain control interventions during procedures as needed Date Initiated: 02/04/2022 Target Resolution Date: 02/04/2022 Goal Status: Active Patient/caregiver will verbalize adequate pain control between visits Date Initiated: 02/04/2022 Target Resolution Date: 02/04/2022 Goal Status: Active Patient/caregiver will verbalize comfort level met Date Initiated: 02/04/2022 Target Resolution Date: 02/04/2022 Goal Status: Active Interventions: Complete pain assessment as per visit requirements Provide education on  pain management Treatment Activities: Administer pain control measures as ordered : 02/04/2022 Notes: Electronic Signature(s) Signed: 02/11/2022 5:00:48 PM By: Carlene Coria RN Entered By: Carlene Coria on 02/11/2022 10:36:34 [name], [name] ([id number]) [name], [name]  ([id number]) -------------------------------------------------------------------------------- Pain Assessment Details Patient Name: [name] Date of Service: 02/11/2022 10:15 AM Medical Record Number: [id number] Patient Account Number: [id number] Date of Birth/Sex: [birth year] (76 y.o. F) Treating RN: Carlene Coria Primary Care Jalani Rominger: [name] Other Clinician: Referring Mozel Burdett: [name] Treating Cranford Blessinger/Extender: Skipper Cliche in Treatment: 55 Active Problems Location of Pain Severity and Description of Pain Patient Has Paino No Site Locations Pain Management and Medication Current Pain Management: Electronic Signature(s) Signed: 02/11/2022 5:00:48 PM By: Carlene Coria RN Entered By: Carlene Coria on 02/11/2022 10:22:43 [name] ([id number]) -------------------------------------------------------------------------------- Patient/Caregiver Education Details Patient Name: [name] Date of Service: 02/11/2022 10:15 AM Medical Record Number: [id number] Patient Account Number: [id number] Date of Birth/Gender: 07-Oct-1945 (76 y.o. F) Treating RN: Carlene Coria Primary Care Physician: [name] Other Clinician: Referring Physician: [name] Treating Physician/Extender: Skipper Cliche in Treatment: 34 Education Assessment Education Provided To: Patient Education Topics Provided Wound Debridement: Methods: Explain/Verbal Responses: State content correctly Electronic Signature(s) Signed: 02/11/2022 5:00:48 PM By: Carlene Coria RN Entered By: Carlene Coria on 02/11/2022 11:01:22 [name] ([id number]) -------------------------------------------------------------------------------- Wound Assessment Details Patient Name: [name] Date of Service: 02/11/2022 10:15 AM Medical Record Number: [id number] Patient Account Number: [id number] Date of Birth/Sex: 1946-03-29 (76 y.o. F) Treating RN: Carlene Coria Primary Care Elic Vencill: [name] Other Clinician: Referring Tesia Lybrand: [name] Treating Regina Ganci/Extender: Skipper Cliche in Treatment: 47 Wound Status Wound Number: 3 Primary Lymphedema Etiology: Wound Location: Left Lower Leg Wound Open Wounding Event: Gradually Appeared Status: Date Acquired: 08/15/2021 Comorbid Cataracts, Lymphedema, Sleep Apnea, Congestive Heart Weeks Of Treatment: 23 History: Failure, Hypertension, Osteoarthritis, Neuropathy, Clustered Wound: No Received Chemotherapy, Received Radiation Wound Measurements Length: (cm) 0 Width: (cm) 0 Depth: (cm) 0 Area: (cm) 0 Volume: (cm) 0 % Reduction in Area: 100% % Reduction in Volume: 100% Epithelialization: Large (67-100%) Tunneling: No Undermining: No Wound Description Classification: Full Thickness Without Exposed Support Structure Exudate Amount: None Present s Foul Odor After Cleansing: No Slough/Fibrino No Wound Bed Granulation Amount: None Present (0%) Exposed Structure Necrotic Amount: None Present (0%) Fascia Exposed: No Fat Layer (Subcutaneous Tissue) Exposed: No Tendon Exposed: No Muscle Exposed: No Joint Exposed: No Bone Exposed: No Electronic Signature(s) Signed: 02/11/2022 5:00:48 PM By: Carlene Coria RN Entered By: Carlene Coria on 02/11/2022 10:33:42 [name] ([id number]) -------------------------------------------------------------------------------- Wound Assessment Details Patient Name: [name] Date of Service: 02/11/2022 10:15 AM Medical Record Number: [id number] Patient Account Number: [id number] Date of Birth/Sex: 08/28/1946 (76 y.o. F) Treating RN: Carlene Coria Primary Care Moriah Loughry: [name] Other Clinician: Referring Sephiroth Mcluckie: [name] Treating Charlea Nardo/Extender: Skipper Cliche in Treatment: 55 Wound Status Wound Number: 4 Primary Venous Leg Ulcer Etiology: Wound Location: Right, Lateral Lower Leg Wound Open Wounding Event: Gradually Appeared Status: Date Acquired:  10/27/2021 Comorbid Cataracts, Lymphedema, Sleep Apnea, Congestive Heart Weeks Of Treatment: 15 History: Failure, Hypertension, Osteoarthritis, Neuropathy, Clustered Wound: No Received Chemotherapy, Received Radiation Photos Wound Measurements Length: (cm) 6 Width: (cm) 6.2 Depth: (cm) 0.1 Area: (cm) 29.217 Volume: (cm) 2.922 % Reduction in Area: -2380.2% % Reduction in Volume: -2376.3% Tunneling: No Undermining: No Wound Description Classification: Full Thickness Without Exposed Support Structures Exudate Amount: Medium Exudate Type: Serosanguineous Exudate Color: red, brown Foul Odor After Cleansing: No Slough/Fibrino Yes Wound Bed Granulation Amount: Medium (34-66%) Exposed Structure Granulation Quality:  Pink Fascia Exposed: No Necrotic Amount: Medium (34-66%) Fat Layer (Subcutaneous Tissue) Exposed: No Necrotic Quality: Adherent Slough Tendon Exposed: No Muscle Exposed: No Joint Exposed: No Bone Exposed: No Treatment Notes Wound #4 (Lower Leg) Wound Laterality: Right, Lateral Cleanser Soap and Water Discharge Instruction: Gently cleanse wound with antibacterial soap, rinse and pat dry prior to dressing wounds Wound Cleanser [name], [name] ([id number]) Discharge Instruction: Wash your hands with soap and water. Remove old dressing, discard into plastic bag and place into trash. Cleanse the wound with Wound Cleanser prior to applying a clean dressing using gauze sponges, not tissues or cotton balls. Do not scrub or use excessive force. Pat dry using gauze sponges, not tissue or cotton balls. Peri-Wound Care Desitin Maximum Strength Ointment 4 (oz) Discharge Instruction: Apply around wound edges to avoid maceration Topical Primary Dressing Silvercel Small 2x2 (in/in) Discharge Instruction: Apply Silvercel Small 2x2 (in/in) as instructed Secondary Dressing ABD Pad 5x9 (in/in) Discharge Instruction: Cover with ABD pad Secured With Compression Wrap 3-LAYER  WRAP - Profore Lite LF 3 Multilayer Compression Bandaging System Discharge Instruction: Apply 3 multi-layer wrap as prescribed. Compression Stockings Circaid Juxta Lite Compression Wrap Quantity: 1 Right Leg Compression Amount: 20-30 mmHg Discharge Instruction: Apply Circaid Juxta Lite Compression Wrap as directed Add-Ons Electronic Signature(s) Signed: 02/11/2022 5:00:48 PM By: Carlene Coria RN Entered By: Carlene Coria on 02/11/2022 10:35:46 [name] ([id number]) -------------------------------------------------------------------------------- [place] Details Patient Name: [name] Date of Service: 02/11/2022 10:15 AM Medical Record Number: [id number] Patient Account Number: [id number] Date of Birth/Sex: 1945/09/14 (76 y.o. F) Treating RN: Carlene Coria Primary Care Jerrell Hart: [name] Other Clinician: Referring Mahitha Hickling: [name] Treating Elvyn Krohn/Extender: Skipper Cliche in Treatment: 55 Vital Signs Time Taken: 10:22 Temperature (F): 98.2 Height (in): 69 Pulse (bpm): 75 Weight (lbs): 244 Respiratory Rate (breaths/min): 18 Body Mass Index (BMI): 36 Blood Pressure (mmHg): 131/75 Reference Range: 80 - 120 mg / dl Electronic Signature(s) Signed: 02/11/2022 5:00:48 PM By: Carlene Coria RN Entered By: Carlene Coria on 02/11/2022 10:22:33

## 2022-02-11 NOTE — Progress Notes (Signed)
STEPHANNY, [name] ([id number]) Visit Report for 02/11/2022 Chief Complaint Document Details Patient Name: [name], [name] Date of Service: 02/11/2022 10:15 AM Medical Record Number: [id number] Patient Account Number: [id number] Date of Birth/Sex: 1946-03-09 (76 y.o. F) Treating RN: Carlene Coria Primary Care Provider: [name] Other Clinician: Referring Provider: [name] Treating Provider/Extender: Skipper Cliche in Treatment: 2 Information Obtained from: Patient Chief Complaint Bilateral LE Ulcers Electronic Signature(s) Signed: 02/11/2022 10:52:39 AM By: Worthy Keeler PA-C Entered By: Worthy Keeler on 02/11/2022 10:52:39 [name] ([id number]) -------------------------------------------------------------------------------- Physician Orders Details Patient Name: [name] Date of Service: 02/11/2022 10:15 AM Medical Record Number: [id number] Patient Account Number: [id number] Date of Birth/Sex: May 28, 1946 (76 y.o. 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 o Nurse Visit as needed Bathing/ Shower/ Hygiene o May shower with wound dressing protected with water repellent cover or cast protector. o No tub bath. Anesthetic (Use 'Patient Medications' Section for Anesthetic Order Entry) o Lidocaine applied to wound bed Edema Control - Lymphedema / Segmental Compressive  Device / Other Bilateral Lower Extremities o Optional: One layer of unna paste to top of compression wrap (to act as an anchor). - she needs this o Elevate, Exercise Daily and Avoid Standing for Long Periods of Time. o Elevate legs to the level of the heart and pump ankles as often as possible o Elevate leg(s) parallel to the floor when sitting. o Compression Pump: Use compression pump on left lower extremity for 60 minutes, twice daily. - You may pump over wraps o Compression Pump: Use compression pump on right lower extremity for 60 minutes, twice daily. - You may pump over wraps Additional Orders / Instructions o Follow Nutritious Diet and Increase Protein Intake Wound Treatment Wound #3 - Lower Leg Wound Laterality: Left Cleanser: Soap and Water 1 x Per Week/30 Days Discharge Instructions: Gently cleanse wound with antibacterial soap, rinse and pat dry prior to dressing wounds Cleanser: Wound Cleanser 1 x Per Week/30 Days Discharge Instructions: Wash your hands with soap and water. Remove old dressing, discard into plastic bag and place into trash. Cleanse the wound with Wound Cleanser prior to applying a clean dressing using gauze sponges, not tissues or cotton balls. Do not scrub or use excessive force. Pat dry using gauze sponges, not tissue or cotton balls. Peri-Wound Care: Moisturizing Lotion 1 x Per Week/30 Days Discharge Instructions: Suggestions: Theraderm, Eucerin, Cetaphil, or patient preference. Primary Dressing: Silvercel Small 2x2 (in/in) 1 x Per Week/30 Days Discharge Instructions: Apply Silvercel Small 2x2 (in/in) as instructed Compression Wrap: 3-LAYER WRAP - Profore Lite LF 3 Multilayer Compression Bandaging System 1 x Per Week/30 Days Discharge Instructions: Apply 3 multi-layer wrap as prescribed. Compression Stockings: Circaid Juxta Lite Compression Wrap Left Leg Compression Amount: 30-40 mmHG Discharge Instructions: Apply Circaid Juxta Lite Compression  Wrap as directed Wound #4 - Lower Leg Wound Laterality: Right, Lateral [name], [name] ([id number]) Cleanser: Soap and Water 1 x Per Week/30 Days Discharge Instructions: Gently cleanse wound with antibacterial soap, rinse and pat dry prior to dressing  wounds Cleanser: Wound Cleanser 1 x Per Week/30 Days Discharge Instructions: Wash your hands with soap and water. Remove old dressing, discard into plastic bag and place into trash. Cleanse the wound with Wound Cleanser prior to applying a clean dressing using gauze sponges, not tissues or cotton balls. Do not scrub or use excessive force. Pat dry using gauze sponges, not tissue or cotton balls. Peri-Wound Care: Desitin Maximum Strength Ointment 4 (oz) 1 x Per Week/30 Days Discharge Instructions: Apply around wound edges to avoid maceration Primary Dressing: Silvercel Small 2x2 (in/in) 1 x Per Week/30 Days Discharge Instructions: Apply Silvercel Small 2x2 (in/in) as instructed Secondary Dressing: ABD Pad 5x9 (in/in) 1 x Per Week/30 Days Discharge Instructions: Cover with ABD pad Compression Wrap: 3-LAYER WRAP - Profore Lite LF 3 Multilayer Compression Bandaging System 1 x Per Week/30 Days Discharge Instructions: Apply 3 multi-layer wrap as prescribed. Compression Stockings: Circaid Juxta Lite Compression Wrap Right Leg Compression Amount: 20-30 mmHG Discharge Instructions: Apply Circaid Juxta Lite Compression Wrap as directed Electronic Signature(s) Unsigned Entered By: Carlene Coria on 02/11/2022 11:00:30 Signature(s): Date(s): [name] ([id number]) -------------------------------------------------------------------------------- Problem List Details Patient Name: [name], [name] Date of Service: 02/11/2022 10:15 AM Medical Record Number: [id number] Patient Account Number: [id number] Date of Birth/Sex: 21-Aug-1946 (76 y.o. 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 Electronic Signature(s) Unsigned Entered By: Carlene Coria on 02/11/2022 11:01:09 Signature(s): Date(s):

## 2022-02-18 ENCOUNTER — Encounter: Payer: Medicare Other | Admitting: Internal Medicine

## 2022-02-18 DIAGNOSIS — I890 Lymphedema, not elsewhere classified: Secondary | ICD-10-CM | POA: Diagnosis not present

## 2022-02-18 NOTE — Progress Notes (Signed)
MODINE, OPPENHEIMER ([id number]) Visit Report for 02/18/2022 Arrival Information Details Patient Name: [name], [name] Date of Service: 02/18/2022 2:00 PM Medical Record Number: [id number] Patient Account Number: [id number] Date of Birth/Sex: Dec 15, 1945 (76 y.o. F) Treating RN: Carlene Coria Primary Care Donnalyn Juran: [name] Other Clinician: Referring Ivyana Locey: [name] Treating Natalin Bible/Extender: Tito Dine in Treatment: 74 Visit Information History Since Last Visit All ordered tests and consults were completed: No Patient Arrived: Wheel Chair Added or deleted any medications: No Arrival Time: 13:49 Any new allergies or adverse reactions: No Accompanied By: self Had a fall or experienced change in No Transfer Assistance: None activities of daily living that may affect Patient Identification Verified: Yes risk of falls: Secondary Verification Process Completed: Yes Signs or symptoms of abuse/neglect since last visito No Patient Requires Transmission-Based Precautions: No Hospitalized since last visit: No Patient Has Alerts: Yes Implantable device outside of the clinic excluding No Patient Alerts: NOT DIABETIC cellular tissue based products placed in the center since last visit: Has Dressing in Place as Prescribed: Yes Pain Present Now: No Electronic Signature(s) Signed: 02/18/2022 2:49:16 PM By: Carlene Coria RN Entered By: Carlene Coria on 02/18/2022 14:10:14 [name] ([id number]) -------------------------------------------------------------------------------- Clinic Level of Care Assessment Details Patient Name: [name] Date of Service: 02/18/2022 2:00 PM Medical Record Number: [id number] Patient Account Number: [id number] Date of Birth/Sex: August 31, 1946 (76 y.o. F) Treating RN: Carlene Coria Primary Care Amoura Ransier: [name] Other Clinician: Referring Tonnia Bardin: [name] Treating Richie Bonanno/Extender: Tito Dine in Treatment:  56 Clinic Level of Care Assessment Items TOOL 1 Quantity Score []  - Use when EandM and Procedure is performed on INITIAL visit 0 ASSESSMENTS - Nursing Assessment / Reassessment []  - General Physical Exam (combine w/ comprehensive assessment (listed just below) when performed on new 0 pt. evals) []  - 0 Comprehensive Assessment (HX, ROS, Risk Assessments, Wounds Hx, etc.) ASSESSMENTS - Wound and Skin Assessment / Reassessment []  - Dermatologic / Skin Assessment (not related to wound area) 0 ASSESSMENTS - Ostomy and/or Continence Assessment and Care []  - Incontinence Assessment and Management 0 []  - 0 Ostomy Care Assessment and Management (repouching, etc.) PROCESS - Coordination of Care []  - Simple Patient / Family Education for ongoing care 0 []  - 0 Complex (extensive) Patient / Family Education for ongoing care []  - 0 Staff obtains Programmer, systems, Records, Test Results / Process Orders []  - 0 Staff telephones HHA, Nursing Homes / Clarify orders / etc []  - 0 Routine Transfer to another Facility (non-emergent condition) []  - 0 Routine Hospital Admission (non-emergent condition) []  - 0 New Admissions / Biomedical engineer / Ordering NPWT, Apligraf, etc. []  - 0 Emergency Hospital Admission (emergent condition) PROCESS - Special Needs []  - Pediatric / Minor Patient Management 0 []  - 0 Isolation Patient Management []  - 0 Hearing / Language / Visual special needs []  - 0 Assessment of Community assistance (transportation, D/C planning, etc.) []  - 0 Additional assistance / Altered mentation []  - 0 Support Surface(s) Assessment (bed, cushion, seat, etc.) INTERVENTIONS - Miscellaneous []  - External ear exam 0 []  - 0 Patient Transfer (multiple staff / Civil Service fast streamer / Similar devices) []  - 0 Simple Staple / Suture removal (25 or less) []  - 0 Complex Staple / Suture removal (26 or more) []  - 0 Hypo/Hyperglycemic Management (do not check if billed separately) []  - 0 Ankle /  Brachial Index (ABI) - do not check if billed separately Has the patient been seen at the hospital within the last three years: Yes Total  Score: 0 Level Of Care: ____ [name] ([id number]) Electronic Signature(s) Signed: 02/18/2022 2:49:16 PM By: Carlene Coria RN Entered By: Carlene Coria on 02/18/2022 14:40:16 [name] ([id number]) -------------------------------------------------------------------------------- Compression Therapy Details Patient Name: [name] Date of Service: 02/18/2022 2:00 PM Medical Record Number: [id number] Patient Account Number: [id number] Date of Birth/Sex: 1946-01-14 (76 y.o. F) Treating RN: Carlene Coria Primary Care Breylen Agyeman: [name] Other Clinician: Referring Chazlyn Cude: [name] Treating Jeromy Borcherding/Extender: Tito Dine in Treatment: 56 Compression Therapy Performed for Wound Assessment: Wound #4 Right,Lateral Lower Leg Performed By: Clinician Carlene Coria, RN Compression Type: Three Layer Post Procedure Diagnosis Same as Pre-procedure Electronic Signature(s) Signed: 02/18/2022 2:39:40 PM By: Carlene Coria RN Entered By: Carlene Coria on 02/18/2022 14:39:39 [name] ([id number]) -------------------------------------------------------------------------------- Encounter Discharge Information Details Patient Name: [name] Date of Service: 02/18/2022 2:00 PM Medical Record Number: [id number] Patient Account Number: [id number] Date of Birth/Sex: July 25, 1946 (76 y.o. F) Treating RN: Carlene Coria Primary Care Giovana Faciane: [name] Other Clinician: Referring Willowdean Luhmann: [name] Treating Sheniqua Carolan/Extender: Tito Dine in Treatment: 47 Encounter Discharge Information Items Discharge Condition: Stable Ambulatory Status: Wheelchair Discharge Destination: Home Transportation: Private Auto Accompanied By: self Schedule Follow-up Appointment: Yes Clinical Summary of Care: Electronic  Signature(s) Signed: 02/18/2022 2:42:08 PM By: Carlene Coria RN Entered By: Carlene Coria on 02/18/2022 14:42:08 [name] ([id number]) -------------------------------------------------------------------------------- Lower Extremity Assessment Details Patient Name: [name] Date of Service: 02/18/2022 2:00 PM Medical Record Number: [id number] Patient Account Number: [id number] Date of Birth/Sex: Jan 21, 1946 (76 y.o. F) Treating RN: Carlene Coria Primary Care Varonica Siharath: [name] Other Clinician: Referring Cortni Tays: [name] Treating Rumor Sun/Extender: Tito Dine in Treatment: 56 Edema Assessment Assessed: [Left: No] [Right: No] Edema: [Left: Yes] [Right: Yes] Calf Left: Right: Point of Measurement: 31 cm From Medial Instep 37 cm 38 cm Ankle Left: Right: Point of Measurement: 10 cm From Medial Instep 26 cm 27 cm Vascular Assessment Pulses: Dorsalis Pedis Palpable: [Left:Yes] [Right:Yes] Electronic Signature(s) Signed: 02/18/2022 2:49:16 PM By: Carlene Coria RN Entered By: Carlene Coria on 02/18/2022 14:12:29 [name] ([id number]) -------------------------------------------------------------------------------- Multi Wound Chart Details Patient Name: [name] Date of Service: 02/18/2022 2:00 PM Medical Record Number: [id number] Patient Account Number: [id number] Date of Birth/Sex: 11/18/1945 (76 y.o. F) Treating RN: Carlene Coria Primary Care Cynai Skeens: [name] Other Clinician: Referring Mykael Batz: [name] Treating Seleen Walter/Extender: Tito Dine in Treatment: 56 Vital Signs Height(in): 69 Pulse(bpm): 75 Weight(lbs): 244 Blood Pressure(mmHg): 131/75 Body Mass Index(BMI): 36 Temperature(F): 98.2 Respiratory Rate(breaths/min): 18 Photos: [N/A:N/A] Wound Location: Right, Lateral Lower Leg N/A N/A Wounding Event: Gradually Appeared N/A N/A Primary Etiology: Venous Leg Ulcer N/A N/A Comorbid History: Cataracts,  Lymphedema, Sleep N/A N/A Apnea, Congestive Heart Failure, Hypertension, Osteoarthritis, Neuropathy, Received Chemotherapy, Received Radiation Date Acquired: 10/27/2021 N/A N/A Weeks of Treatment: 16 N/A N/A Wound Status: Open N/A N/A Wound Recurrence: No N/A N/A Measurements L x W x D (cm) 6x6x0.1 N/A N/A Area (cm) : 28.274 N/A N/A Volume (cm) : 2.827 N/A N/A % Reduction in Area: -2300.20% N/A N/A % Reduction in Volume: -2295.80% N/A N/A Classification: Full Thickness Without Exposed N/A N/A Support Structures Exudate Amount: Medium N/A N/A Exudate Type: Serosanguineous N/A N/A Exudate Color: red, brown N/A N/A Granulation Amount: Medium (34-66%) N/A N/A Granulation Quality: Pink N/A N/A Necrotic Amount: Medium (34-66%) N/A N/A Exposed Structures: Fascia: No N/A N/A Fat Layer (Subcutaneous Tissue): No Tendon: No Muscle: No Joint: No Bone: No Treatment Notes Electronic Signature(s) Signed: 02/18/2022 2:49:16 PM By: Carlene Coria  RN Entered By: Carlene Coria on 02/18/2022 14:12:48 [name] ([id number]) -------------------------------------------------------------------------------- Multi-Disciplinary Care Plan Details Patient Name: TENZIN, PAVON Date of Service: 02/18/2022 2:00 PM Medical Record Number: [id number] Patient Account Number: [id number] Date of Birth/Sex: [birth year] (76 y.o. F) Treating RN: Carlene Coria Primary Care Fausto Sampedro: [name] Other Clinician: Referring Khila Papp: [name] Treating Lenda Baratta/Extender: Tito Dine in Treatment: 75 Active Inactive Necrotic Tissue Nursing Diagnoses: Impaired tissue integrity related to necrotic/devitalized tissue Knowledge deficit related to management of necrotic/devitalized tissue Goals: Necrotic/devitalized tissue will be minimized in the wound bed Date Initiated: 02/04/2022 Target Resolution Date: 02/04/2022 Goal Status: Active Patient/caregiver will verbalize understanding of reason  and process for debridement of necrotic tissue Date Initiated: 02/04/2022 Target Resolution Date: 02/04/2022 Goal Status: Active Interventions: Assess patient pain level pre-, during and post procedure and prior to discharge Provide education on necrotic tissue and debridement process Treatment Activities: Apply topical anesthetic as ordered : 02/04/2022 Excisional debridement : 02/04/2022 Notes: Pain, Acute or Chronic Nursing Diagnoses: Pain Management - Non-cyclic Acute (Procedural) Goals: Patient will verbalize adequate pain control and receive pain control interventions during procedures as needed Date Initiated: 02/04/2022 Target Resolution Date: 02/04/2022 Goal Status: Active Patient/caregiver will verbalize adequate pain control between visits Date Initiated: 02/04/2022 Target Resolution Date: 02/04/2022 Goal Status: Active Patient/caregiver will verbalize comfort level met Date Initiated: 02/04/2022 Target Resolution Date: 02/04/2022 Goal Status: Active Interventions: Complete pain assessment as per visit requirements Provide education on pain management Treatment Activities: Administer pain control measures as ordered : 02/04/2022 Notes: Electronic Signature(s) Signed: 02/18/2022 2:49:16 PM By: Carlene Coria RN Entered By: Carlene Coria on 02/18/2022 14:12:37 [name] ([id number]) [place], [name] ([id number]) -------------------------------------------------------------------------------- Pain Assessment Details Patient Name: [name] Date of Service: 02/18/2022 2:00 PM Medical Record Number: [id number] Patient Account Number: [id number] Date of Birth/Sex: 16-Apr-1946 (76 y.o. F) Treating RN: Carlene Coria Primary Care Janei Scheff: [name] Other Clinician: Referring Jaeanna Mccomber: [name] Treating Damiean Lukes/Extender: Tito Dine in Treatment: 27 Active Problems Location of Pain Severity and Description of Pain Patient Has Paino No Site  Locations Pain Management and Medication Current Pain Management: Electronic Signature(s) Signed: 02/18/2022 2:49:16 PM By: Carlene Coria RN Entered By: Carlene Coria on 02/18/2022 14:11:24 [name] ([id number]) -------------------------------------------------------------------------------- Patient/Caregiver Education Details Patient Name: [name] Date of Service: 02/18/2022 2:00 PM Medical Record Number: [id number] Patient Account Number: [id number] Date of Birth/Gender: 09/23/1945 (76 y.o. F) Treating RN: Carlene Coria Primary Care Physician: [name] Other Clinician: Referring Physician: [name] Treating Physician/Extender: Tito Dine in Treatment: 29 Education Assessment Education Provided To: Patient Education Topics Provided Pain: Methods: Explain/Verbal Responses: State content correctly Electronic Signature(s) Signed: 02/18/2022 2:49:16 PM By: Carlene Coria RN Entered By: Carlene Coria on 02/18/2022 14:41:32 [name] ([id number]) -------------------------------------------------------------------------------- Wound Assessment Details Patient Name: [name] Date of Service: 02/18/2022 2:00 PM Medical Record Number: [id number] Patient Account Number: [id number] Date of Birth/Sex: 1946/02/26 (76 y.o. F) Treating RN: Carlene Coria Primary Care Ayjah Show: [name] Other Clinician: Referring Shawniece Oyola: [name] Treating Livingston Denner/Extender: Tito Dine in Treatment: 56 Wound Status Wound Number: 4 Primary Venous Leg Ulcer Etiology: Wound Location: Right, Lateral Lower Leg Wound Open Wounding Event: Gradually Appeared Status: Date Acquired: 10/27/2021 Comorbid Cataracts, Lymphedema, Sleep Apnea, Congestive Heart Weeks Of Treatment: 16 History: Failure, Hypertension, Osteoarthritis, Neuropathy, Clustered Wound: No Received Chemotherapy, Received Radiation Photos Wound Measurements Length: (cm) 6 Width:  (cm) 6 Depth: (cm) 0.1 Area: (cm) 28.274 Volume: (cm) 2.827 % Reduction in Area: -2300.2% % Reduction in  Volume: -2295.8% Tunneling: No Undermining: No Wound Description Classification: Full Thickness Without Exposed Support Structures Exudate Amount: Medium Exudate Type: Serosanguineous Exudate Color: red, brown Foul Odor After Cleansing: No Slough/Fibrino Yes Wound Bed Granulation Amount: Medium (34-66%) Exposed Structure Granulation Quality: Pink Fascia Exposed: No Necrotic Amount: Medium (34-66%) Fat Layer (Subcutaneous Tissue) Exposed: No Necrotic Quality: Adherent Slough Tendon Exposed: No Muscle Exposed: No Joint Exposed: No Bone Exposed: No Treatment Notes Wound #4 (Lower Leg) Wound Laterality: Right, Lateral Cleanser Soap and Water Discharge Instruction: Gently cleanse wound with antibacterial soap, rinse and pat dry prior to dressing wounds Wound Cleanser SHARETHA, NEWSON ([id number]) Discharge Instruction: Wash your hands with soap and water. Remove old dressing, discard into plastic bag and place into trash. Cleanse the wound with Wound Cleanser prior to applying a clean dressing using gauze sponges, not tissues or cotton balls. Do not scrub or use excessive force. Pat dry using gauze sponges, not tissue or cotton balls. Peri-Wound Care Desitin Maximum Strength Ointment 4 (oz) Discharge Instruction: Apply around wound edges to avoid maceration Topical Primary Dressing Silvercel Small 2x2 (in/in) Discharge Instruction: Apply Silvercel Small 2x2 (in/in) as instructed Secondary Dressing ABD Pad 5x9 (in/in) Discharge Instruction: Cover with ABD pad Secured With Compression Wrap 3-LAYER WRAP - Profore Lite LF 3 Multilayer Compression Bandaging System Discharge Instruction: Apply 3 multi-layer wrap as prescribed. Compression Stockings Circaid Juxta Lite Compression Wrap Quantity: 1 Right Leg Compression Amount: 20-30 mmHg Discharge Instruction: Apply  Circaid Juxta Lite Compression Wrap as directed Add-Ons Electronic Signature(s) Signed: 02/18/2022 2:49:16 PM By: Carlene Coria RN Entered By: Carlene Coria on 02/18/2022 14:11:50 [name] ([id number]) -------------------------------------------------------------------------------- Cutter Details Patient Name: [name] Date of Service: 02/18/2022 2:00 PM Medical Record Number: [id number] Patient Account Number: [id number] Date of Birth/Sex: 10-Dec-1945 (76 y.o. F) Treating RN: Carlene Coria Primary Care Alvah Lagrow: [name] Other Clinician: Referring Quamesha Mullet: [name] Treating Lorelee Mclaurin/Extender: Tito Dine in Treatment: 56 Vital Signs Time Taken: 14:00 Temperature (F): 98.2 Height (in): 69 Pulse (bpm): 75 Weight (lbs): 244 Respiratory Rate (breaths/min): 18 Body Mass Index (BMI): 36 Blood Pressure (mmHg): 131/75 Reference Range: 80 - 120 mg / dl Electronic Signature(s) Signed: 02/18/2022 2:49:16 PM By: Carlene Coria RN Entered By: Carlene Coria on 02/18/2022 14:11:16

## 2022-02-19 NOTE — Progress Notes (Signed)
[name], [name] ([id number]) Visit Report for 02/18/2022 HPI Details Patient Name: [name], [name] Date of Service: 02/18/2022 2:00 PM Medical Record Number: [id number] Patient Account Number: [id number] Date of Birth/Sex: 1945/12/04 (76 y.o. F) Treating RN: Carlene Coria Primary Care Provider: [name] Other Clinician: Referring Provider: [name] Treating Provider/Extender: Tito Dine in Treatment: 23 History of Present Illness HPI Description: 03/15/2020 upon evaluation today patient presents today for initial evaluation here in our clinic concerning a wound that is on the left posterior lower extremity. Unfortunately this has been giving the patient some discomfort at this point which she notes has been affected her pretty much daily. The patient does have a history of venous insufficiency, hypertension, congestive heart failure, and a history of breast cancer. Fortunately there does not appear to be evidence of active infection at this time which is great news. No fevers, chills, nausea, vomiting, or diarrhea. Wound is not extremely large but does have some slough covering the surface of the wound. This is going require sharp debridement today. 03/15/2020 upon evaluation today patient appears to be doing fairly well in regard to her wound. She does have some slough noted on the surface of the wound currently but I do believe the Iodoflex has been beneficial over the past week. There is no signs of active infection at this time which is great news and overall very pleased with the progress. No fevers, chills, nausea, vomiting, or diarrhea. 04/02/2020 upon evaluation today patient appears to be doing a little better in regard to her wound size wise this is not tremendously smaller she does have some slough buildup on the surface of the wound. With that being said this is going require some sharp debridement today to clear away some of the necrotic debris. Fortunately there is  no evidence of active infection at this time. No fevers, chills, nausea, vomiting, or diarrhea. 04/10/2020 on evaluation today patient appears to be doing well with regard to her ulcer which is measuring somewhat smaller today. She actually has more granulation tissue noted which is also good news is no need for sharp debridement today. Fortunately there is no evidence of infection either which is also excellent. No fevers, chills, nausea, vomiting, or diarrhea. 04/26/2020 on evaluation today patient's wound actually is showing signs of good improvement which is great news. There does not appear to be any evidence of active infection. I do believe she is tolerating the collagen at this point. 05/03/2020 upon evaluation today patient's wound actually showed signs of good granulation at this time there does not appear to be any evidence of active infection which is great news and overall very pleased with where things stand. With that being said I do believe that the patient is can require some sharp debridement today but fortunately nothing too significant. 05/11/20 on evaluation today patient appears to be doing well in regard to her leg ulcer. She's been tolerating the dressing changes without complication. Fortunately there is no signs of active infection at this time. Overall I'm very pleased with where things stand. 05/18/2020 upon evaluation today patient's wound is showing signs of improvement is very dry and I think the collagen for the most part just dried to the wound bed. I am going to clean this away with sharp debridement in order to get this under control in my opinion. 05/25/2020 upon evaluation today patient actually appears to be doing well in regard to her leg ulcer. In fact upon inspection it appears she could potentially be  completely healed but that is not guaranteed based on what we are seeing. There does not appear to be any signs of active infection which is great news. 05/31/2020 on  evaluation today patient appears to be doing well with regard to her wounds. She in fact appears to be almost completely healed on the left leg there is just a very small opening remaining point 1 06/08/2020 upon evaluation today patient appears to be doing excellent in regard to her leg ulcer on the left in fact it appears to be that she is completely healed as of today. I see no signs of active infection at this time which is great news. No fevers, chills, nausea, vomiting, or diarrhea. READMISSION 01/16/2021 Patient that we have had in this clinic with a wound on the left posterior calf in the summer 2021 extending into October. She was felt to have chronic venous insufficiency. Per the patient's description she was discharged with what sounds like external compression stockings although she could not afford the "$75 per leg". She therefore did not get anything. Apparently her wound that she has currently is been in existence since January. She has been followed at vein and vascular with Unna boots and I think calcium alginate. She had ABIs done on 06/25/2020 that showed an noncompressible ABI on the right leg and the left leg but triphasic waveforms with good great toe pressures and a TBI of 0.90 on the right and 0.91 on the left Surprisingly the patient also had venous reflux studies that were really unremarkable. This included no evidence of DVTs or SVTs but there was no evidence of deep venous insufficiency or superficial venous insufficiency in the greater saphenous or short saphenous veins bilaterally. I would wonder about more central venous issues or perhaps this is all lymphedema 01/25/2021 upon evaluation today patient appears to be doing decently well and guard to her wound. The good news is the Iodoflex that is job she saw Dr. Dellia Nims last week for readmission and to be honest I think that this has done extremely well over that week. I do believe that she can tolerate the sharp  debridement today which will be good. I think that cleaning off the wound will likely allow [place] to be able to use a different type of dressing I do not think the Iodoflex will even be necessary based on how clean the wound looks. Fortunately there is no sign of active infection at this time which is great news. No fevers, chills, nausea, vomiting, or diarrhea. [name], [name] ([id number]) 02/12/2021 upon evaluation today patient appears to be doing well with regard to her leg ulcer. We did switch to alginate when she came for nurse visit I think is doing much better for her. Fortunately there does not appear to be any signs of active infection at this time which is great news. No fevers, chills, nausea, vomiting, or diarrhea. 02/18/2021 upon evaluation today patient's wound is actually showing signs of good improvement. I am very pleased with where things stand currently. I do not see any signs of active infection which is great news and overall I feel like she is making good progress. The patient likewise is happy that things are doing so well and that this is measuring somewhat smaller. 02/25/2021 upon evaluation today patient actually appears to be doing quite well in regard to her wounds. She has been tolerating the dressing changes without complication. Fortunately there does not appear to be any signs of active infection which is great  news and overall I am extremely pleased with where things stand. No fevers, chills, nausea, vomiting, or diarrhea. She does have a lot of edema I really do think she needs compression socks to be worn in order to prevent things from causing additional issues for her currently. Especially on the right leg she should be wearing compression socks all the time to be honest. 03/04/2021 upon evaluation today patient's leg ulcer actually appears to be doing pretty well which is great news. There does not appear to be any significant change but overall the appearance is  better even though the size is not necessarily reflecting a great improvement. Fortunately there does not appear to be any signs of active infection. No fevers, chills, nausea, vomiting, or diarrhea. 03/12/2021 upon evaluation today patient appears to be doing well with regard to her wounds. She has been tolerating the dressing changes without complication. The good news is that the wound on her left lateral leg is doing much better and is showing signs of good improvement. Overall her swelling is controlled well as well. She does need some compression socks she plans to go Jefferey socks next week. 03/18/2021 on evaluation today patient's wound actually is showing signs of good improvement. She does have a little bit of slough this can require some sharp debridement today. 03/25/2021 upon evaluation today patient appears to be doing well with regard to her wound on the left lateral leg. She has been tolerating the dressing changes without complication. Fortunately there does not appear to be any signs of active infection at this time. No fever chills no 04/01/2021 upon evaluation today patient's wound is showing signs of improvement and overall very pleased with where things stand at this point. There is no evidence of active infection which is great news as well and in general I think that she is making great progress. 04/09/2021 upon evaluation today patient appears to be doing well with regard to her ankle ulcer. She is making good progress currently which is great news. There does not appear to be any signs of infection also excellent news. In general I am extremely pleased with where things stand today. No fevers, chills, nausea, vomiting, or diarrhea. 04/23/2021 upon inspection today patient appears to be doing quite well with regard to her leg ulcer. She has been tolerating the dressing changes without complication. She is can require some sharp debridement today but overall seems to be doing  excellent. 05/06/2021 upon evaluation today patient unfortunately appears to be doing poorly overall in regard to her swelling. She is actually significantly more swollen than last week despite the fact that her wound is doing better this is not a good trend. I think that she probably needs to see her cardiologist ASAP and I discussed that with her today. This includes the fact that honestly I think she probably is getting need an increase in her diuretic or something to try to clear away some of the excess fluid that she is experiencing at the moment. She is not been doing her pumps even twice a day but again right now more concerned that if she were to do the pump she would fluid overload her lungs causing other issues as far as her breathing is concerned. Obviously I think she needs to contact them and move up the appointment for the 12th of something sooner 2 weeks is a bit too long to wait with how swollen she has. 05/14/2021 upon evaluation today patient's wound actually showing signs of being  a little bit smaller compared to previous. She has been making good progress however. Fortunately there does not appear to be any evidence of active infection which is great. Overall I am extremely pleased in that regard. Nonetheless I am still concerned that she is having quite a bit of issue as far as the swelling is concerned she has been placed on fluid restriction as well as an increase in the furosemide by her doctor. We will see how things progress. 05/21/2021 upon evaluation today patient appears to be doing well with regard to her wound in general. She has been tolerating the dressing changes without complication. With that being said she has been using silver cell for a while and while this was showing signs of improvement it is now gotten to the point where this has slowed down quite significantly. I do believe that switching to a different dressing may be beneficial for her today. 05/28/2021 upon  evaluation today patient actually appears to be doing quite well in regard to her wound. In fact there is really not any need for sharp debridement today based on what I see. The Hydrofera Blue is doing great and overall I think that she is managing quite nicely with the compression wrapping. Her edema is still down quite a bit with the wrapping in place. 06/11/2021 upon evaluation today patient appears to be doing well with regard to her leg ulcer. She has been tolerating the dressing changes without complication. Fortunately there does not appear to be any signs of active infection at this time which is great news. No fevers, chills, nausea, vomiting, or diarrhea. She is going require some sharp debridement today. 10/11; left leg ulcer much better looking and with improved surface area. She is using Hydrofera Blue under 3 layer compression. She does not have any stocking on the right leg which in itself has very significant nonpitting edema 06/25/2021 upon evaluation today patient appears to be doing well with regard to the wound on her left leg. She has been tolerating the dressing changes without complication and overall I am extremely pleased with where things stand today. I do not see any evidence of infection which is great news. 07/02/2021 upon evaluation today patient's wound actually showing signs of excellent improvement and actually very pleased with where we stand today and the overall appearance. Fortunately there does not appear to be any signs of active infection. No fevers, chills, nausea, vomiting, or diarrhea. 07/09/2021 upon evaluation today patient appears to be doing better in regard to her wound this is measuring smaller and she is headed in the appropriate direction based on what I am seeing currently. I do not see any evidence of active infection systemically which is great news. 07/16/2021 upon evaluation today patient appears to be doing pretty well currently in regard to her  leg ulcer. This is measuring smaller and looking much better this is great news. Fortunately there does not appear to be any evidence of active infection systemically which is great news [name], [name] ([id number]) as well. 07/23/2021 upon evaluation today patient's wound is actually showing signs of good improvement this is definitely measuring smaller. I do not see any signs of infection which is great news and overall very pleased in that regard. Overall I think that she is doing well with the [facility]. 07/30/2020 upon evaluation today patient appears to be doing well in regard to her wound. She has been tolerating the dressing changes without complication think the Hydrofera Blue at this point  is actually causing her to dry out a lot as far as the wound bed is concerned. Subsequently I think that we need to try to see what we can do to improve this. Fortunately there does not appear to be any evidence of active infection at this time which is great news. 08/06/2021 upon evaluation today patient appears to be doing well with regards to her wound. Is not measuring significantly smaller initial inspection but upon closer inspection she has a lot of new skin growth across the central portion of the wound. I think will get very close to complete resolution. 08/13/2021 upon evaluation today patient appears to be doing well with regard to her wound. In fact this appears to be I believe healed although there is still some question as to whether it is completely so. I am concerned about the fact that to be honest she still has some evidence of dry skin that could be just trapping something underneath I still want to debride anything as I am afraid of causing some damage to the good skin for that reason I Georgina Peer probably monitor for 1 more week before completely closing everything out. 08/20/2021 upon evaluation today patient actually appears to be doing excellent. In fact she appears to be  completely healed. This was the case last week as well we just wanted to make sure nothing reopened since she does not really have an ability to put on compression socks this is good to be something we need to make sure was well-healed. Nonetheless I think we have achieved that goal as of today. 12/27; this is a patient we discharged 2 weeks ago with wounds on her left lower leg laterally. She was supposed to get stockings and really did not get them. She developed blistering and reopening probably sometime late last week. She has 3 areas in the same area as previously. The mid calf dimensions on the left went from 39 to 51 cm today. She also has very significant edema on the right leg but as far as I am aware has not had wounds in this area 1/4; patient presents for follow-up. She has tolerated the compression wrap well. She has no issues or complaints today. 09/17/2021 upon evaluation today patient appears to be doing well with regard to her wounds. She has been tolerating the dressing changes without complication. Fortunately there does not appear to be any signs of active infection at this time. No fevers, chills, nausea, vomiting, or diarrhea. 10/01/2021 upon evaluation today patient appears to be doing well with regard to her wound this is actually measuring better and looking smaller and I am very pleased in that regard. Fortunately I do not see any evidence of active infection locally nor systemically at this point. No fevers, chills, nausea, vomiting, or diarrhea. 10/08/2021 upon evaluation patient's wounds are actually showing some signs of improvement measuring a little bit smaller and looking a little bit better. Overall I do not think there is any need for sharp debridement today which is good news. No fevers, chills, nausea, vomiting, or diarrhea. 10/14/2021 upon evaluation today patient appears to be doing well with regard to her wound. She has been tolerating the dressing changes  without complication and overall I am extremely pleased with where things stand today. There is a little bit of film on the surface of the wounds but this was carefully cleaned away with just saline and gauze she really did not want me to do any sharp debridement today. 10/29/2021 upon  evaluation today patient appears to be doing quite well in regard to her wounds. She is actually showing signs of excellent improvement which is great news and overall I feel like we are on the right track at this time. She does have a wound on both legs and she does need something compression wise when she heals for that reason we will get a go and see about ordering her bilateral juxta lite compression wraps. 11/05/2021 upon evaluation today patient appears to be doing well with regard to her wound. She has been tolerating the dressing changes without complication. Fortunately I do not see any evidence of active infection locally nor systemically at this time which is great news. No fevers, chills, nausea, vomiting, or diarrhea. 11/12/2021 upon evaluation today patient appears to be doing well with regard to her wounds. She is doing excellent on the left on the right this is not doing quite as well there is some slough and biofilm buildup. 3/14; patient presents for follow-up. She has no issues or complaints today. She has tolerated the compression wraps well. 11/26/2021 upon evaluation today patient appears to be doing well with regard to her wounds. Both are showing signs of good improvement which is great news. I do not see any evidence of active infection and overall think she is healing quite nicely. 12/03/2021 upon evaluation today patient appears to be doing well with regard to her wounds. Both are showing signs of excellent improvement I am very pleased with where things stand and I think that she is making good progress here. Fortunately I do not see any signs of active infection locally or systemically which is  great news. No fevers, chills, nausea, vomiting, or diarrhea. 12-10-2021 upon evaluation today patient's wounds actually appear to be doing awesome. I am extremely pleased with where we stand I think she is making excellent progress. I do not see any signs of active infection locally or systemically at this time. 12-17-2021 upon evaluation today patient's wounds are actually showing signs of good improvement. Fortunately I do not see any evidence of active infection locally nor systemically which is great news. Overall I do not believe that she is showing any signs of significant worsening which is good news. With that being said I do believe that she has a little bit of film on the surface of the wound I was actually able to clear this away with saline and gauze no sharp debridement was necessary. 12-24-2021 upon evaluation today patient appears to be doing well with regard to her wounds. I do not see any signs of active infection currently which is great news and overall I think that we are on the right track here. No fevers, chills, nausea, vomiting, or diarrhea. 12-31-2021 upon evaluation today patient appears to be doing well with regard to her wounds. Both are showing signs of improvement the wound [name], [name] ([id number]) on the right leg is going require some sharp debridement on the left seems to be doing quite well. 01-07-2022 upon evaluation today patient appears to be doing well currently in regard to her wounds. She is actually making good progress bilaterally. Both of them require little bit of sharp debridement but not too much which is great news. Upon inspection patient's wound bed actually showed signs of good granulation and epithelization on both legs. She still is having significant issues with the right leg compared to the left although I do feel like both are actually showing signs of pretty good improvement. 01-21-2022 upon  evaluation today patient's wound is actually showing  signs of excellent improvement I am very pleased with where we stand currently. I do not see any signs of active infection locally or systemically which is great news. No fevers, chills, nausea, vomiting, or diarrhea. 01-28-2022 upon evaluation today patient appears to be doing well with regard to her wound on the left leg this is pretty much just about healed which is great news. Unfortunately in regard to the wound on her right leg this is not doing nearly as well in fact I think we probably need to see about a culture today that was discussed with her. 02-04-2022 upon evaluation today patient appears to be doing well with regard to her left leg which in fact might actually be completely healed I am not 100% sure. Nonetheless this is shown signs of excellent improvement which is great news. With that being said in regards to the right leg there is some slough and film buildup on the surface of the wound although she is feeling much better than last week this does appear to be doing much better than it was last week. Fortunately there does not appear to be any signs of active infection locally or systemically at this time. 02-11-2022 upon evaluation today patient's wounds appear to be doing decently well. In fact the left leg is healed although there is brand-new skin were definitely given still need to wrap her for the time being. On the right leg this is showing signs of some epithelial growth in the middle of the wound it still measures the same because there is still a larger area with speckled openings but again as far as the entire area being open this is significantly improved which is great news. I am very pleased. 6/13; left leg remains healed and she has a juxta light to apply to it today. There is some concern about whether she is going to be able to do this at home. She lives with a daughter although she has her own disability. On the right the wound looks smaller to me nice epithelialization  good edema control. Electronic Signature(s) Signed: 02/19/2022 11:05:19 AM By: Linton Ham MD Entered By: Linton Ham on 02/18/2022 14:18:42 [name] ([id number]) -------------------------------------------------------------------------------- Physical Exam Details Patient Name: [name] Date of Service: 02/18/2022 2:00 PM Medical Record Number: [id number] Patient Account Number: [id number] Date of Birth/Sex: [birth year] (76 y.o. F) Treating RN: Carlene Coria Primary Care Provider: [name] Other Clinician: Referring Provider: [name] Treating Provider/Extender: Tito Dine in Treatment: 56 Constitutional Sitting or standing Blood Pressure is within target range for patient.. Pulse regular and within target range for patient.Marland Kitchen Respirations regular, non- labored and within target range.. Temperature is normal and within the target range for the patient.Marland Kitchen appears in no distress. Notes Wound exam; everything is closed on the left leg. On the right lateral she still has an open area although there is good epithelialization. Edema control is excellent Electronic Signature(s) Signed: 02/19/2022 11:05:19 AM By: Linton Ham MD Entered By: Linton Ham on 02/18/2022 14:20:22 [name] ([id number]) -------------------------------------------------------------------------------- Physician Orders Details Patient Name: [name] Date of Service: 02/18/2022 2:00 PM Medical Record Number: [id number] Patient Account Number: [id number] Date of Birth/Sex: [birth year] (76 y.o. 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 o Nurse Visit as needed Bathing/ Shower/ Hygiene o May shower with wound dressing protected with water repellent cover or cast protector. o No tub bath. Anesthetic (Use 'Patient Medications' Section for Anesthetic Order Entry) o Lidocaine applied to wound bed Edema Control - Lymphedema / Segmental Compressive Device / Other Bilateral Lower Extremities o Optional: One layer of unna paste to top of compression wrap (to act as an anchor). - she needs this o Patient to wear own Velcro compression garment. Remove compression stockings every night before going to bed and put on every morning when getting up. - left leg o Elevate, Exercise Daily and Avoid Standing for Long Periods of Time. o Elevate legs to the level of the heart and pump ankles as often as possible o Elevate leg(s) parallel to the floor when sitting. o Compression Pump: Use compression pump on left lower extremity for 60 minutes, twice daily. - You may pump over wraps o Compression Pump: Use compression pump on right lower extremity for 60 minutes, twice daily. - You may pump over wraps Additional Orders / Instructions o Follow Nutritious Diet and Increase Protein Intake Wound Treatment Wound #4 - Lower Leg Wound Laterality: Right, Lateral Cleanser: Soap and Water 1 x Per Week/30 Days Discharge Instructions: Gently cleanse wound with antibacterial soap, rinse and pat dry prior to dressing wounds Cleanser: Wound Cleanser 1 x Per Week/30 Days Discharge Instructions: Wash your hands with soap and water. Remove old dressing, discard into plastic bag and place into trash. Cleanse the wound with Wound Cleanser prior to applying a clean dressing using gauze sponges, not tissues or cotton balls. Do not scrub or use excessive  force. Pat dry using gauze sponges, not tissue or cotton balls. Peri-Wound Care: Desitin Maximum Strength Ointment 4 (oz) 1 x Per Week/30 Days Discharge Instructions: Apply around wound edges to avoid maceration Primary Dressing: Silvercel Small 2x2 (in/in) 1 x Per Week/30 Days Discharge Instructions: Apply Silvercel Small 2x2 (in/in) as instructed Secondary Dressing: ABD Pad 5x9 (in/in) 1 x Per Week/30 Days Discharge Instructions: Cover with ABD pad Compression Wrap: 3-LAYER WRAP - Profore Lite LF 3 Multilayer Compression Bandaging System 1 x Per Week/30 Days Discharge Instructions: Apply 3 multi-layer wrap as prescribed. Compression Stockings: Circaid Juxta Lite Compression Wrap Right Leg Compression Amount: 20-30 mmHG ASA, FATH ([id number]) Discharge Instructions: Apply Circaid Juxta Lite Compression Wrap as directed Electronic Signature(s) Signed: 02/18/2022 2:49:16 PM By: Carlene Coria RN Signed: 02/19/2022 11:05:19 AM By: Linton Ham MD Previous Signature: 02/18/2022 2:40:08 PM Version By: Carlene Coria RN Entered By: Carlene Coria on 02/18/2022 14:41:21 [name] ([id number]) -------------------------------------------------------------------------------- Problem List Details Patient Name: [name] Date of Service: 02/18/2022 2:00 PM Medical Record Number: [id number] Patient Account Number: [id number] Date of Birth/Sex: 25-Apr-1946 (76 y.o. F) Treating RN: Carlene Coria Primary Care Provider: [name] Other Clinician: Referring Provider: [name] Treating Provider/Extender: Tito Dine in Treatment: 36 Active Problems ICD-10 Encounter Code Description Active Date MDM Diagnosis I89.0 Lymphedema, not elsewhere classified 01/16/2021 No Yes I87.332 Chronic venous hypertension (idiopathic) with ulcer and inflammation of 01/16/2021 No Yes left lower extremity L97.812 Non-pressure chronic ulcer of other part of right lower leg with fat layer  01/07/2022 No Yes exposed Inactive Problems ICD-10 Code Description Active Date Inactive Date L97.822 Non-pressure chronic ulcer of other  part of left lower leg with fat layer exposed 01/16/2021 01/16/2021 Resolved Problems Electronic Signature(s) Signed: 02/19/2022 11:05:19 AM By: Linton Ham MD Entered By: Linton Ham on 02/18/2022 14:17:51 [name] ([id number]) -------------------------------------------------------------------------------- Progress Note Details Patient Name: [name] Date of Service: 02/18/2022 2:00 PM Medical Record Number: [id number] Patient Account Number: [id number] Date of Birth/Sex: November 17, 1945 (76 y.o. F) Treating RN: Carlene Coria Primary Care Provider: [name] Other Clinician: Referring Provider: [name] Treating Provider/Extender: Tito Dine in Treatment: 56 Subjective History of Present Illness (HPI) 03/15/2020 upon evaluation today patient presents today for initial evaluation here in our clinic concerning a wound that is on the left posterior lower extremity. Unfortunately this has been giving the patient some discomfort at this point which she notes has been affected her pretty much daily. The patient does have a history of venous insufficiency, hypertension, congestive heart failure, and a history of breast cancer. Fortunately there does not appear to be evidence of active infection at this time which is great news. No fevers, chills, nausea, vomiting, or diarrhea. Wound is not extremely large but does have some slough covering the surface of the wound. This is going require sharp debridement today. 03/15/2020 upon evaluation today patient appears to be doing fairly well in regard to her wound. She does have some slough noted on the surface of the wound currently but I do believe the Iodoflex has been beneficial over the past week. There is no signs of active infection at this time which is great news and overall  very pleased with the progress. No fevers, chills, nausea, vomiting, or diarrhea. 04/02/2020 upon evaluation today patient appears to be doing a little better in regard to her wound size wise this is not tremendously smaller she does have some slough buildup on the surface of the wound. With that being said this is going require some sharp debridement today to clear away some of the necrotic debris. Fortunately there is no evidence of active infection at this time. No fevers, chills, nausea, vomiting, or diarrhea. 04/10/2020 on evaluation today patient appears to be doing well with regard to her ulcer which is measuring somewhat smaller today. She actually has more granulation tissue noted which is also good news is no need for sharp debridement today. Fortunately there is no evidence of infection either which is also excellent. No fevers, chills, nausea, vomiting, or diarrhea. 04/26/2020 on evaluation today patient's wound actually is showing signs of good improvement which is great news. There does not appear to be any evidence of active infection. I do believe she is tolerating the collagen at this point. 05/03/2020 upon evaluation today patient's wound actually showed signs of good granulation at this time there does not appear to be any evidence of active infection which is great news and overall very pleased with where things stand. With that being said I do believe that the patient is can require some sharp debridement today but fortunately nothing too significant. 05/11/20 on evaluation today patient appears to be doing well in regard to her leg ulcer. She's been tolerating the dressing changes without complication. Fortunately there is no signs of active infection at this time. Overall I'm very pleased with where things stand. 05/18/2020 upon evaluation today patient's wound is showing signs of improvement is very dry and I think the collagen for the most part just dried to the wound bed. I am going  to clean this away with sharp debridement in order to get this under control in  my opinion. 05/25/2020 upon evaluation today patient actually appears to be doing well in regard to her leg ulcer. In fact upon inspection it appears she could potentially be completely healed but that is not guaranteed based on what we are seeing. There does not appear to be any signs of active infection which is great news. 05/31/2020 on evaluation today patient appears to be doing well with regard to her wounds. She in fact appears to be almost completely healed on the left leg there is just a very small opening remaining point 1 06/08/2020 upon evaluation today patient appears to be doing excellent in regard to her leg ulcer on the left in fact it appears to be that she is completely healed as of today. I see no signs of active infection at this time which is great news. No fevers, chills, nausea, vomiting, or diarrhea. READMISSION 01/16/2021 Patient that we have had in this clinic with a wound on the left posterior calf in the summer 2021 extending into October. She was felt to have chronic venous insufficiency. Per the patient's description she was discharged with what sounds like external compression stockings although she could not afford the "$75 per leg". She therefore did not get anything. Apparently her wound that she has currently is been in existence since January. She has been followed at vein [facility] vascular with Unna boots and I think calcium alginate. She had ABIs done on 06/25/2020 that showed an noncompressible ABI on the right leg and the left leg but triphasic waveforms with good great toe pressures and a TBI of 0.90 on the right and 0.91 on the left Surprisingly the patient also had venous reflux studies that were really unremarkable. This included no evidence of DVTs or SVTs but there was no evidence of deep venous insufficiency or superficial venous insufficiency in the greater saphenous or short  saphenous veins bilaterally. I would wonder about more central venous issues or perhaps this is all lymphedema 01/25/2021 upon evaluation today patient appears to be doing decently well and guard to her wound. The good news is the Iodoflex that is job she saw Dr. Dellia Nims last week for readmission and to be honest I think that this has done extremely well over that week. I do believe that she can tolerate the sharp debridement today which will be good. I think that cleaning off the wound will likely allow [place] to be able to use a different type of dressing I do not think the Iodoflex will even be necessary based on how clean the wound looks. Fortunately there is no sign of active infection at this time which is great news. No fevers, chills, nausea, vomiting, or diarrhea. 02/12/2021 upon evaluation today patient appears to be doing well with regard to her leg ulcer. We did switch to alginate when she came for nurse visit I think is doing much better for her. Fortunately there does not appear to be any signs of active infection at this time which is great news. No fevers, chills, nausea, vomiting, or diarrhea. [name], [name] ([id number]) 02/18/2021 upon evaluation today patient's wound is actually showing signs of good improvement. I am very pleased with where things stand currently. I do not see any signs of active infection which is great news and overall I feel like she is making good progress. The patient likewise is happy that things are doing so well and that this is measuring somewhat smaller. 02/25/2021 upon evaluation today patient actually appears to be doing quite well in  regard to her wounds. She has been tolerating the dressing changes without complication. Fortunately there does not appear to be any signs of active infection which is great news and overall I am extremely pleased with where things stand. No fevers, chills, nausea, vomiting, or diarrhea. She does have a lot of edema I really do  think she needs compression socks to be worn in order to prevent things from causing additional issues for her currently. Especially on the right leg she should be wearing compression socks all the time to be honest. 03/04/2021 upon evaluation today patient's leg ulcer actually appears to be doing pretty well which is great news. There does not appear to be any significant change but overall the appearance is better even though the size is not necessarily reflecting a great improvement. Fortunately there does not appear to be any signs of active infection. No fevers, chills, nausea, vomiting, or diarrhea. 03/12/2021 upon evaluation today patient appears to be doing well with regard to her wounds. She has been tolerating the dressing changes without complication. The good news is that the wound on her left lateral leg is doing much better and is showing signs of good improvement. Overall her swelling is controlled well as well. She does need some compression socks she plans to go Jefferey socks next week. 03/18/2021 on evaluation today patient's wound actually is showing signs of good improvement. She does have a little bit of slough this can require some sharp debridement today. 03/25/2021 upon evaluation today patient appears to be doing well with regard to her wound on the left lateral leg. She has been tolerating the dressing changes without complication. Fortunately there does not appear to be any signs of active infection at this time. No fever chills no 04/01/2021 upon evaluation today patient's wound is showing signs of improvement and overall very pleased with where things stand at this point. There is no evidence of active infection which is great news as well and in general I think that she is making great progress. 04/09/2021 upon evaluation today patient appears to be doing well with regard to her ankle ulcer. She is making good progress currently which is great news. There does not appear to be  any signs of infection also excellent news. In general I am extremely pleased with where things stand today. No fevers, chills, nausea, vomiting, or diarrhea. 04/23/2021 upon inspection today patient appears to be doing quite well with regard to her leg ulcer. She has been tolerating the dressing changes without complication. She is can require some sharp debridement today but overall seems to be doing excellent. 05/06/2021 upon evaluation today patient unfortunately appears to be doing poorly overall in regard to her swelling. She is actually significantly more swollen than last week despite the fact that her wound is doing better this is not a good trend. I think that she probably needs to see her cardiologist ASAP and I discussed that with her today. This includes the fact that honestly I think she probably is getting need an increase in her diuretic or something to try to clear away some of the excess fluid that she is experiencing at the moment. She is not been doing her pumps even twice a day but again right now more concerned that if she were to do the pump she would fluid overload her lungs causing other issues as far as her breathing is concerned. Obviously I think she needs to contact them and move up the appointment for the 12th  of something sooner 2 weeks is a bit too long to wait with how swollen she has. 05/14/2021 upon evaluation today patient's wound actually showing signs of being a little bit smaller compared to previous. She has been making good progress however. Fortunately there does not appear to be any evidence of active infection which is great. Overall I am extremely pleased in that regard. Nonetheless I am still concerned that she is having quite a bit of issue as far as the swelling is concerned she has been placed on fluid restriction as well as an increase in the furosemide by her doctor. We will see how things progress. 05/21/2021 upon evaluation today patient appears to be  doing well with regard to her wound in general. She has been tolerating the dressing changes without complication. With that being said she has been using silver cell for a while and while this was showing signs of improvement it is now gotten to the point where this has slowed down quite significantly. I do believe that switching to a different dressing may be beneficial for her today. 05/28/2021 upon evaluation today patient actually appears to be doing quite well in regard to her wound. In fact there is really not any need for sharp debridement today based on what I see. The Hydrofera Blue is doing great and overall I think that she is managing quite nicely with the compression wrapping. Her edema is still down quite a bit with the wrapping in place. 06/11/2021 upon evaluation today patient appears to be doing well with regard to her leg ulcer. She has been tolerating the dressing changes without complication. Fortunately there does not appear to be any signs of active infection at this time which is great news. No fevers, chills, nausea, vomiting, or diarrhea. She is going require some sharp debridement today. 10/11; left leg ulcer much better looking and with improved surface area. She is using Hydrofera Blue under 3 layer compression. She does not have any stocking on the right leg which in itself has very significant nonpitting edema 06/25/2021 upon evaluation today patient appears to be doing well with regard to the wound on her left leg. She has been tolerating the dressing changes without complication and overall I am extremely pleased with where things stand today. I do not see any evidence of infection which is great news. 07/02/2021 upon evaluation today patient's wound actually showing signs of excellent improvement and actually very pleased with where we stand today and the overall appearance. Fortunately there does not appear to be any signs of active infection. No fevers, chills,  nausea, vomiting, or diarrhea. 07/09/2021 upon evaluation today patient appears to be doing better in regard to her wound this is measuring smaller and she is headed in the appropriate direction based on what I am seeing currently. I do not see any evidence of active infection systemically which is great news. 07/16/2021 upon evaluation today patient appears to be doing pretty well currently in regard to her leg ulcer. This is measuring smaller and looking much better this is great news. Fortunately there does not appear to be any evidence of active infection systemically which is great news as well. 07/23/2021 upon evaluation today patient's wound is actually showing signs of good improvement this is definitely measuring smaller. I do not see any signs of infection which is great news and overall very pleased in that regard. Overall I think that she is doing well with the [facility]. [name], [name] ([id number]) 07/30/2020 upon evaluation  today patient appears to be doing well in regard to her wound. She has been tolerating the dressing changes without complication think the Hydrofera Blue at this point is actually causing her to dry out a lot as far as the wound bed is concerned. Subsequently I think that we need to try to see what we can do to improve this. Fortunately there does not appear to be any evidence of active infection at this time which is great news. 08/06/2021 upon evaluation today patient appears to be doing well with regards to her wound. Is not measuring significantly smaller initial inspection but upon closer inspection she has a lot of new skin growth across the central portion of the wound. I think will get very close to complete resolution. 08/13/2021 upon evaluation today patient appears to be doing well with regard to her wound. In fact this appears to be I believe healed although there is still some question as to whether it is completely so. I am concerned about the  fact that to be honest she still has some evidence of dry skin that could be just trapping something underneath I still want to debride anything as I am afraid of causing some damage to the good skin for that reason I Georgina Peer probably monitor for 1 more week before completely closing everything out. 08/20/2021 upon evaluation today patient actually appears to be doing excellent. In fact she appears to be completely healed. This was the case last week as well we just wanted to make sure nothing reopened since she does not really have an ability to put on compression socks this is good to be something we need to make sure was well-healed. Nonetheless I think we have achieved that goal as of today. 12/27; this is a patient we discharged 2 weeks ago with wounds on her left lower leg laterally. She was supposed to get stockings and really did not get them. She developed blistering and reopening probably sometime late last week. She has 3 areas in the same area as previously. The mid calf dimensions on the left went from 39 to 51 cm today. She also has very significant edema on the right leg but as far as I am aware has not had wounds in this area 1/4; patient presents for follow-up. She has tolerated the compression wrap well. She has no issues or complaints today. 09/17/2021 upon evaluation today patient appears to be doing well with regard to her wounds. She has been tolerating the dressing changes without complication. Fortunately there does not appear to be any signs of active infection at this time. No fevers, chills, nausea, vomiting, or diarrhea. 10/01/2021 upon evaluation today patient appears to be doing well with regard to her wound this is actually measuring better and looking smaller and I am very pleased in that regard. Fortunately I do not see any evidence of active infection locally nor systemically at this point. No fevers, chills, nausea, vomiting, or diarrhea. 10/08/2021 upon evaluation  patient's wounds are actually showing some signs of improvement measuring a little bit smaller and looking a little bit better. Overall I do not think there is any need for sharp debridement today which is good news. No fevers, chills, nausea, vomiting, or diarrhea. 10/14/2021 upon evaluation today patient appears to be doing well with regard to her wound. She has been tolerating the dressing changes without complication and overall I am extremely pleased with where things stand today. There is a little bit of film on the surface  of the wounds but this was carefully cleaned away with just saline and gauze she really did not want me to do any sharp debridement today. 10/29/2021 upon evaluation today patient appears to be doing quite well in regard to her wounds. She is actually showing signs of excellent improvement which is great news and overall I feel like we are on the right track at this time. She does have a wound on both legs and she does need something compression wise when she heals for that reason we will get a go and see about ordering her bilateral juxta lite compression wraps. 11/05/2021 upon evaluation today patient appears to be doing well with regard to her wound. She has been tolerating the dressing changes without complication. Fortunately I do not see any evidence of active infection locally nor systemically at this time which is great news. No fevers, chills, nausea, vomiting, or diarrhea. 11/12/2021 upon evaluation today patient appears to be doing well with regard to her wounds. She is doing excellent on the left on the right this is not doing quite as well there is some slough and biofilm buildup. 3/14; patient presents for follow-up. She has no issues or complaints today. She has tolerated the compression wraps well. 11/26/2021 upon evaluation today patient appears to be doing well with regard to her wounds. Both are showing signs of good improvement which is great news. I do not see  any evidence of active infection and overall think she is healing quite nicely. 12/03/2021 upon evaluation today patient appears to be doing well with regard to her wounds. Both are showing signs of excellent improvement I am very pleased with where things stand and I think that she is making good progress here. Fortunately I do not see any signs of active infection locally or systemically which is great news. No fevers, chills, nausea, vomiting, or diarrhea. 12-10-2021 upon evaluation today patient's wounds actually appear to be doing awesome. I am extremely pleased with where we stand I think she is making excellent progress. I do not see any signs of active infection locally or systemically at this time. 12-17-2021 upon evaluation today patient's wounds are actually showing signs of good improvement. Fortunately I do not see any evidence of active infection locally nor systemically which is great news. Overall I do not believe that she is showing any signs of significant worsening which is good news. With that being said I do believe that she has a little bit of film on the surface of the wound I was actually able to clear this away with saline and gauze no sharp debridement was necessary. 12-24-2021 upon evaluation today patient appears to be doing well with regard to her wounds. I do not see any signs of active infection currently which is great news and overall I think that we are on the right track here. No fevers, chills, nausea, vomiting, or diarrhea. 12-31-2021 upon evaluation today patient appears to be doing well with regard to her wounds. Both are showing signs of improvement the wound on the right leg is going require some sharp debridement on the left seems to be doing quite well. 01-07-2022 upon evaluation today patient appears to be doing well currently in regard to her wounds. She is actually making good progress bilaterally. Both of them require little bit of sharp debridement but not too  much which is great news. [place], [place] ([id number]) Upon inspection patient's wound bed actually showed signs of good granulation and epithelization on both legs. She  still is having significant issues with the right leg compared to the left although I do feel like both are actually showing signs of pretty good improvement. 01-21-2022 upon evaluation today patient's wound is actually showing signs of excellent improvement I am very pleased with where we stand currently. I do not see any signs of active infection locally or systemically which is great news. No fevers, chills, nausea, vomiting, or diarrhea. 01-28-2022 upon evaluation today patient appears to be doing well with regard to her wound on the left leg this is pretty much just about healed which is great news. Unfortunately in regard to the wound on her right leg this is not doing nearly as well in fact I think we probably need to see about a culture today that was discussed with her. 02-04-2022 upon evaluation today patient appears to be doing well with regard to her left leg which in fact might actually be completely healed I am not 100% sure. Nonetheless this is shown signs of excellent improvement which is great news. With that being said in regards to the right leg there is some slough and film buildup on the surface of the wound although she is feeling much better than last week this does appear to be doing much better than it was last week. Fortunately there does not appear to be any signs of active infection locally or systemically at this time. 02-11-2022 upon evaluation today patient's wounds appear to be doing decently well. In fact the left leg is healed although there is brand-new skin were definitely given still need to wrap her for the time being. On the right leg this is showing signs of some epithelial growth in the middle of the wound it still measures the same because there is still a larger area with speckled openings but  again as far as the entire area being open this is significantly improved which is great news. I am very pleased. 6/13; left leg remains healed and she has a juxta light to apply to it today. There is some concern about whether she is going to be able to do this at home. She lives with a daughter although she has her own disability. On the right the wound looks smaller to me nice epithelialization good edema control. Objective Constitutional Sitting or standing Blood Pressure is within target range for patient.. Pulse regular and within target range for patient.Marland Kitchen Respirations regular, non- labored and within target range.. Temperature is normal and within the target range for the patient.Marland Kitchen appears in no distress. Vitals Time Taken: 2:00 PM, Height: 69 in, Weight: 244 lbs, BMI: 36, Temperature: 98.2 F, Pulse: 75 bpm, Respiratory Rate: 18 breaths/min, Blood Pressure: 131/75 mmHg. General Notes: Wound exam; everything is closed on the left leg. On the right lateral she still has an open area although there is good epithelialization. Edema control is excellent Integumentary (Hair, Skin) Wound #4 status is Open. Original cause of wound was Gradually Appeared. The date acquired was: 10/27/2021. The wound has been in treatment 16 weeks. The wound is located on the Right,Lateral Lower Leg. The wound measures 6cm length x 6cm width x 0.1cm depth; 28.274cm^2 area and 2.827cm^3 volume. There is no tunneling or undermining noted. There is a medium amount of serosanguineous drainage noted. There is medium (34-66%) pink granulation within the wound bed. There is a medium (34-66%) amount of necrotic tissue within the wound bed including Adherent Slough. 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 We continued with the dressing of the right leg which is silver alginate under 3 layer compression. 2. We applied her own juxta lite with instructions to moisturize her skin nightly and apply the juxta light in the morning. We are hopeful that this can be managed at home Electronic Signature(s) Signed: 02/19/2022 11:05:19 AM By: Linton Ham MD Entered By: Linton Ham on 02/18/2022 14:21:13 [name] ([id number]) -------------------------------------------------------------------------------- [facility] Details Patient Name: [name] Date of Service: 02/18/2022 Medical Record Number: [id number] Patient Account Number: [id number] Date of Birth/Sex: [birth year] (76 y.o. F) Treating RN: Carlene Coria Primary Care Provider: [name] Other Clinician: Referring Provider: [name] Treating Provider/Extender: Tito Dine in Treatment: 56 Diagnosis Coding ICD-10 Codes Code Description I89.0 Lymphedema, not elsewhere classified I87.332 Chronic venous hypertension (idiopathic) with ulcer and inflammation of left lower extremity L97.812 Non-pressure chronic ulcer of other part of right lower leg with fat layer exposed Facility Procedures CPT4 Code: [id number] Description: (Facility Use Only) [phone] - [place] [facility] RT LEG Modifier: Quantity: 1 Physician Procedures CPT4 Code Description: [id number] - WC PHYS LEVEL 3 - EST PT Modifier: Quantity: 1 CPT4 Code Description: ICD-10 Diagnosis Description I89.0 Lymphedema, not elsewhere classified I87.332 Chronic venous hypertension (idiopathic) with ulcer and inflammation of L97.812 Non-pressure chronic ulcer of other part of right lower leg with fat  lay Modifier: left lower extremity er exposed Quantity: Electronic Signature(s) Signed: 02/18/2022 2:40:26 PM By: Carlene Coria RN Signed: 02/19/2022 11:05:19 AM By: Linton Ham MD Entered By: Carlene Coria on 02/18/2022 14:40:26

## 2022-02-24 NOTE — Progress Notes (Signed)
Error

## 2022-02-25 ENCOUNTER — Encounter: Payer: Medicare Other | Admitting: Physician Assistant

## 2022-02-25 DIAGNOSIS — I890 Lymphedema, not elsewhere classified: Secondary | ICD-10-CM | POA: Diagnosis not present

## 2022-02-25 NOTE — Progress Notes (Signed)
[name], [name] ([id number]) Visit Report for 02/25/2022 Chief Complaint Document Details Patient Name: [name], [name] Date of Service: 02/25/2022 2:00 PM Medical Record Number: [id number] Patient Account Number: [id number] Date of Birth/Sex: [birth year] (76 y.o. F) Treating RN: Carlene Coria Primary Care Provider: [name] Other Clinician: Referring Provider: [name] Treating Provider/Extender: Skipper Cliche in Treatment: 22 Information Obtained from: Patient Chief Complaint Bilateral LE Ulcers Electronic Signature(s) Signed: 02/25/2022 2:04:14 PM By: Worthy Keeler PA-C Entered By: Worthy Keeler on 02/25/2022 14:04:14 [name] ([id number]) -------------------------------------------------------------------------------- HPI Details Patient Name: [name] Date of Service: 02/25/2022 2:00 PM Medical Record Number: [id number] Patient Account Number: [id number] Date of Birth/Sex: [birth year] (76 y.o. F) Treating RN: Carlene Coria Primary Care Provider: [name] Other Clinician: Referring Provider: [name] Treating Provider/Extender: Skipper Cliche in Treatment: 70 History of Present Illness HPI Description: 03/15/2020 upon evaluation today patient presents today for initial evaluation here in our clinic concerning a wound that is on the left posterior lower extremity. Unfortunately this has been giving the patient some discomfort at this point which she notes has been affected her pretty much daily. The patient does have a history of venous insufficiency, hypertension, congestive heart failure, and a history of breast cancer. Fortunately there does not appear to be evidence of active infection at this time which is great news. No fevers, chills, nausea, vomiting, or diarrhea. Wound is not extremely large but does have some slough covering the surface of the wound. This is going require sharp debridement today. 03/15/2020 upon evaluation today patient  appears to be doing fairly well in regard to her wound. She does have some slough noted on the surface of the wound currently but I do believe the Iodoflex has been beneficial over the past week. There is no signs of active infection at this time which is great news and overall very pleased with the progress. No fevers, chills, nausea, vomiting, or diarrhea. 04/02/2020 upon evaluation today patient appears to be doing a little better in regard to her wound size wise this is not tremendously smaller she does have some slough buildup on the surface of the wound. With that being said this is going require some sharp debridement today to clear away some of the necrotic debris. Fortunately there is no evidence of active infection at this time. No fevers, chills, nausea, vomiting, or diarrhea. 04/10/2020 on evaluation today patient appears to be doing well with regard to her ulcer which is measuring somewhat smaller today. She actually has more granulation tissue noted which is also good news is no need for sharp debridement today. Fortunately there is no evidence of infection either which is also excellent. No fevers, chills, nausea, vomiting, or diarrhea. 04/26/2020 on evaluation today patient's wound actually is showing signs of good improvement which is great news. There does not appear to be any evidence of active infection. I do believe she is tolerating the collagen at this point. 05/03/2020 upon evaluation today patient's wound actually showed signs of good granulation at this time there does not appear to be any evidence of active infection which is great news and overall very pleased with where things stand. With that being said I do believe that the patient is can require some sharp debridement today but fortunately nothing too significant. 05/11/20 on evaluation today patient appears to be doing well in regard to her leg ulcer. She's been tolerating the dressing changes without complication.  Fortunately there is no signs of active infection at this time.  Overall I'm very pleased with where things stand. 05/18/2020 upon evaluation today patient's wound is showing signs of improvement is very dry and I think the collagen for the most part just dried to the wound bed. I am going to clean this away with sharp debridement in order to get this under control in my opinion. 05/25/2020 upon evaluation today patient actually appears to be doing well in regard to her leg ulcer. In fact upon inspection it appears she could potentially be completely healed but that is not guaranteed based on what we are seeing. There does not appear to be any signs of active infection which is great news. 05/31/2020 on evaluation today patient appears to be doing well with regard to her wounds. She in fact appears to be almost completely healed on the left leg there is just a very small opening remaining point 1 06/08/2020 upon evaluation today patient appears to be doing excellent in regard to her leg ulcer on the left in fact it appears to be that she is completely healed as of today. I see no signs of active infection at this time which is great news. No fevers, chills, nausea, vomiting, or diarrhea. READMISSION 01/16/2021 Patient that we have had in this clinic with a wound on the left posterior calf in the summer 2021 extending into October. She was felt to have chronic venous insufficiency. Per the patient's description she was discharged with what sounds like external compression stockings although she could not afford the "$75 per leg". She therefore did not get anything. Apparently her wound that she has currently is been in existence since January. She has been followed at vein and vascular with Unna boots and I think calcium alginate. She had ABIs done on 06/25/2020 that showed an noncompressible ABI on the right leg and the left leg but triphasic waveforms with good great toe pressures and a TBI of 0.90 on the  right and 0.91 on the left Surprisingly the patient also had venous reflux studies that were really unremarkable. This included no evidence of DVTs or SVTs but there was no evidence of deep venous insufficiency or superficial venous insufficiency in the greater saphenous or short saphenous veins bilaterally. I would wonder about more central venous issues or perhaps this is all lymphedema 01/25/2021 upon evaluation today patient appears to be doing decently well and guard to her wound. The good news is the Iodoflex that is job she saw Dr. Dellia Nims last week for readmission and to be honest I think that this has done extremely well over that week. I do believe that she can tolerate the sharp debridement today which will be good. I think that cleaning off the wound will likely allow [place] to be able to use a different type of dressing I do not think the Iodoflex will even be necessary based on how clean the wound looks. Fortunately there is no sign of active infection at this time which is great news. No fevers, chills, nausea, vomiting, or diarrhea. 02/12/2021 upon evaluation today patient appears to be doing well with regard to her leg ulcer. We did switch to alginate when she came for nurse visit I think is doing much better for her. Fortunately there does not appear to be any signs of active infection at this time which is great news. No fevers, chills, nausea, vomiting, or diarrhea. [name], [name] ([id number]) 02/18/2021 upon evaluation today patient's wound is actually showing signs of good improvement. I am very pleased with where things stand  currently. I do not see any signs of active infection which is great news and overall I feel like she is making good progress. The patient likewise is happy that things are doing so well and that this is measuring somewhat smaller. 02/25/2021 upon evaluation today patient actually appears to be doing quite well in regard to her wounds. She has been tolerating the  dressing changes without complication. Fortunately there does not appear to be any signs of active infection which is great news and overall I am extremely pleased with where things stand. No fevers, chills, nausea, vomiting, or diarrhea. She does have a lot of edema I really do think she needs compression socks to be worn in order to prevent things from causing additional issues for her currently. Especially on the right leg she should be wearing compression socks all the time to be honest. 03/04/2021 upon evaluation today patient's leg ulcer actually appears to be doing pretty well which is great news. There does not appear to be any significant change but overall the appearance is better even though the size is not necessarily reflecting a great improvement. Fortunately there does not appear to be any signs of active infection. No fevers, chills, nausea, vomiting, or diarrhea. 03/12/2021 upon evaluation today patient appears to be doing well with regard to her wounds. She has been tolerating the dressing changes without complication. The good news is that the wound on her left lateral leg is doing much better and is showing signs of good improvement. Overall her swelling is controlled well as well. She does need some compression socks she plans to go Jefferey socks next week. 03/18/2021 on evaluation today patient's wound actually is showing signs of good improvement. She does have a little bit of slough this can require some sharp debridement today. 03/25/2021 upon evaluation today patient appears to be doing well with regard to her wound on the left lateral leg. She has been tolerating the dressing changes without complication. Fortunately there does not appear to be any signs of active infection at this time. No fever chills no 04/01/2021 upon evaluation today patient's wound is showing signs of improvement and overall very pleased with where things stand at this point. There is no evidence of active  infection which is great news as well and in general I think that she is making great progress. 04/09/2021 upon evaluation today patient appears to be doing well with regard to her ankle ulcer. She is making good progress currently which is great news. There does not appear to be any signs of infection also excellent news. In general I am extremely pleased with where things stand today. No fevers, chills, nausea, vomiting, or diarrhea. 04/23/2021 upon inspection today patient appears to be doing quite well with regard to her leg ulcer. She has been tolerating the dressing changes without complication. She is can require some sharp debridement today but overall seems to be doing excellent. 05/06/2021 upon evaluation today patient unfortunately appears to be doing poorly overall in regard to her swelling. She is actually significantly more swollen than last week despite the fact that her wound is doing better this is not a good trend. I think that she probably needs to see her cardiologist ASAP and I discussed that with her today. This includes the fact that honestly I think she probably is getting need an increase in her diuretic or something to try to clear away some of the excess fluid that she is experiencing at the moment. She is  not been doing her pumps even twice a day but again right now more concerned that if she were to do the pump she would fluid overload her lungs causing other issues as far as her breathing is concerned. Obviously I think she needs to contact them and move up the appointment for the 12th of something sooner 2 weeks is a bit too long to wait with how swollen she has. 05/14/2021 upon evaluation today patient's wound actually showing signs of being a little bit smaller compared to previous. She has been making good progress however. Fortunately there does not appear to be any evidence of active infection which is great. Overall I am extremely pleased in that regard. Nonetheless I am  still concerned that she is having quite a bit of issue as far as the swelling is concerned she has been placed on fluid restriction as well as an increase in the furosemide by her doctor. We will see how things progress. 05/21/2021 upon evaluation today patient appears to be doing well with regard to her wound in general. She has been tolerating the dressing changes without complication. With that being said she has been using silver cell for a while and while this was showing signs of improvement it is now gotten to the point where this has slowed down quite significantly. I do believe that switching to a different dressing may be beneficial for her today. 05/28/2021 upon evaluation today patient actually appears to be doing quite well in regard to her wound. In fact there is really not any need for sharp debridement today based on what I see. The Hydrofera Blue is doing great and overall I think that she is managing quite nicely with the compression wrapping. Her edema is still down quite a bit with the wrapping in place. 06/11/2021 upon evaluation today patient appears to be doing well with regard to her leg ulcer. She has been tolerating the dressing changes without complication. Fortunately there does not appear to be any signs of active infection at this time which is great news. No fevers, chills, nausea, vomiting, or diarrhea. She is going require some sharp debridement today. 10/11; left leg ulcer much better looking and with improved surface area. She is using Hydrofera Blue under 3 layer compression. She does not have any stocking on the right leg which in itself has very significant nonpitting edema 06/25/2021 upon evaluation today patient appears to be doing well with regard to the wound on her left leg. She has been tolerating the dressing changes without complication and overall I am extremely pleased with where things stand today. I do not see any evidence of infection which is great  news. 07/02/2021 upon evaluation today patient's wound actually showing signs of excellent improvement and actually very pleased with where we stand today and the overall appearance. Fortunately there does not appear to be any signs of active infection. No fevers, chills, nausea, vomiting, or diarrhea. 07/09/2021 upon evaluation today patient appears to be doing better in regard to her wound this is measuring smaller and she is headed in the appropriate direction based on what I am seeing currently. I do not see any evidence of active infection systemically which is great news. 07/16/2021 upon evaluation today patient appears to be doing pretty well currently in regard to her leg ulcer. This is measuring smaller and looking much better this is great news. Fortunately there does not appear to be any evidence of active infection systemically which is great news as well.  07/23/2021 upon evaluation today patient's wound is actually showing signs of good improvement this is definitely measuring smaller. I do not see any signs of infection which is great news and overall very pleased in that regard. Overall I think that she is doing well with the Jahnay Lantier, [place] ([id number]) [place]. 07/30/2020 upon evaluation today patient appears to be doing well in regard to her wound. She has been tolerating the dressing changes without complication think the Hydrofera Blue at this point is actually causing her to dry out a lot as far as the wound bed is concerned. Subsequently I think that we need to try to see what we can do to improve this. Fortunately there does not appear to be any evidence of active infection at this time which is great news. 08/06/2021 upon evaluation today patient appears to be doing well with regards to her wound. Is not measuring significantly smaller initial inspection but upon closer inspection she has a lot of new skin growth across the central portion of the wound. I think will  get very close to complete resolution. 08/13/2021 upon evaluation today patient appears to be doing well with regard to her wound. In fact this appears to be I believe healed although there is still some question as to whether it is completely so. I am concerned about the fact that to be honest she still has some evidence of dry skin that could be just trapping something underneath I still want to debride anything as I am afraid of causing some damage to the good skin for that reason I Georgina Peer probably monitor for 1 more week before completely closing everything out. 08/20/2021 upon evaluation today patient actually appears to be doing excellent. In fact she appears to be completely healed. This was the case last week as well we just wanted to make sure nothing reopened since she does not really have an ability to put on compression socks this is good to be something we need to make sure was well-healed. Nonetheless I think we have achieved that goal as of today. 12/27; this is a patient we discharged 2 weeks ago with wounds on her left lower leg laterally. She was supposed to get stockings and really did not get them. She developed blistering and reopening probably sometime late last week. She has 3 areas in the same area as previously. The mid calf dimensions on the left went from 39 to 51 cm today. She also has very significant edema on the right leg but as far as I am aware has not had wounds in this area 1/4; patient presents for follow-up. She has tolerated the compression wrap well. She has no issues or complaints today. 09/17/2021 upon evaluation today patient appears to be doing well with regard to her wounds. She has been tolerating the dressing changes without complication. Fortunately there does not appear to be any signs of active infection at this time. No fevers, chills, nausea, vomiting, or diarrhea. 10/01/2021 upon evaluation today patient appears to be doing well with regard to her  wound this is actually measuring better and looking smaller and I am very pleased in that regard. Fortunately I do not see any evidence of active infection locally nor systemically at this point. No fevers, chills, nausea, vomiting, or diarrhea. 10/08/2021 upon evaluation patient's wounds are actually showing some signs of improvement measuring a little bit smaller and looking a little bit better. Overall I do not think there is any need for sharp debridement today which  is good news. No fevers, chills, nausea, vomiting, or diarrhea. 10/14/2021 upon evaluation today patient appears to be doing well with regard to her wound. She has been tolerating the dressing changes without complication and overall I am extremely pleased with where things stand today. There is a little bit of film on the surface of the wounds but this was carefully cleaned away with just saline and gauze she really did not want me to do any sharp debridement today. 10/29/2021 upon evaluation today patient appears to be doing quite well in regard to her wounds. She is actually showing signs of excellent improvement which is great news and overall I feel like we are on the right track at this time. She does have a wound on both legs and she does need something compression wise when she heals for that reason we will get a go and see about ordering her bilateral juxta lite compression wraps. 11/05/2021 upon evaluation today patient appears to be doing well with regard to her wound. She has been tolerating the dressing changes without complication. Fortunately I do not see any evidence of active infection locally nor systemically at this time which is great news. No fevers, chills, nausea, vomiting, or diarrhea. 11/12/2021 upon evaluation today patient appears to be doing well with regard to her wounds. She is doing excellent on the left on the right this is not doing quite as well there is some slough and biofilm buildup. 3/14; patient  presents for follow-up. She has no issues or complaints today. She has tolerated the compression wraps well. 11/26/2021 upon evaluation today patient appears to be doing well with regard to her wounds. Both are showing signs of good improvement which is great news. I do not see any evidence of active infection and overall think she is healing quite nicely. 12/03/2021 upon evaluation today patient appears to be doing well with regard to her wounds. Both are showing signs of excellent improvement I am very pleased with where things stand and I think that she is making good progress here. Fortunately I do not see any signs of active infection locally or systemically which is great news. No fevers, chills, nausea, vomiting, or diarrhea. 12-10-2021 upon evaluation today patient's wounds actually appear to be doing awesome. I am extremely pleased with where we stand I think she is making excellent progress. I do not see any signs of active infection locally or systemically at this time. 12-17-2021 upon evaluation today patient's wounds are actually showing signs of good improvement. Fortunately I do not see any evidence of active infection locally nor systemically which is great news. Overall I do not believe that she is showing any signs of significant worsening which is good news. With that being said I do believe that she has a little bit of film on the surface of the wound I was actually able to clear this away with saline and gauze no sharp debridement was necessary. 12-24-2021 upon evaluation today patient appears to be doing well with regard to her wounds. I do not see any signs of active infection currently which is great news and overall I think that we are on the right track here. No fevers, chills, nausea, vomiting, or diarrhea. 12-31-2021 upon evaluation today patient appears to be doing well with regard to her wounds. Both are showing signs of improvement the wound on the right leg is going require  some sharp debridement on the left seems to be doing quite well. 01-07-2022 upon evaluation today patient  appears to be doing well currently in regard to her wounds. She is actually making good progress bilaterally. Both of them require little bit of sharp debridement but not too much which is great news. [place], [place] ([id number]) Upon inspection patient's wound bed actually showed signs of good granulation and epithelization on both legs. She still is having significant issues with the right leg compared to the left although I do feel like both are actually showing signs of pretty good improvement. 01-21-2022 upon evaluation today patient's wound is actually showing signs of excellent improvement I am very pleased with where we stand currently. I do not see any signs of active infection locally or systemically which is great news. No fevers, chills, nausea, vomiting, or diarrhea. 01-28-2022 upon evaluation today patient appears to be doing well with regard to her wound on the left leg this is pretty much just about healed which is great news. Unfortunately in regard to the wound on her right leg this is not doing nearly as well in fact I think we probably need to see about a culture today that was discussed with her. 02-04-2022 upon evaluation today patient appears to be doing well with regard to her left leg which in fact might actually be completely healed I am not 100% sure. Nonetheless this is shown signs of excellent improvement which is great news. With that being said in regards to the right leg there is some slough and film buildup on the surface of the wound although she is feeling much better than last week this does appear to be doing much better than it was last week. Fortunately there does not appear to be any signs of active infection locally or systemically at this time. 02-11-2022 upon evaluation today patient's wounds appear to be doing decently well. In fact the left leg is healed  although there is brand-new skin were definitely given still need to wrap her for the time being. On the right leg this is showing signs of some epithelial growth in the middle of the wound it still measures the same because there is still a larger area with speckled openings but again as far as the entire area being open this is significantly improved which is great news. I am very pleased. 6/13; left leg remains healed and she has a juxta light to apply to it today. There is some concern about whether she is going to be able to do this at home. She lives with a daughter although she has her own disability. On the right the wound looks smaller to me nice epithelialization good edema control. 02-25-2022 upon evaluation today patient appears to be doing well with regard to her legs the left leg is still healed the right leg is doing much better. I think we are on the right track here. With that being said unfortunately she continues to have significant issues with some other problems and she actually tells me on Saturday she was having lunch and went inside to sit down when she tells me she had all of a sudden a sharp sensation that went from her hand through her arm into her leg and her neck and she tells me that she could not see anything for the next hour it was completely black. Slowly after this her vision started to return and has been normal since. It sounds to me as if she may have had a TIA or mini stroke at this point. Nonetheless I think that she needs to have this  checked out ASAP. Her primary care provider is Dr. Clide Deutscher at [facility]. We will get a try to get in touch with him. Electronic Signature(s) Signed: 02/25/2022 2:38:14 PM By: Worthy Keeler PA-C Entered By: Worthy Keeler on 02/25/2022 14:38:14 [name] ([id number]) -------------------------------------------------------------------------------- Physical Exam Details Patient Name: [name] Date of Service:  02/25/2022 2:00 PM Medical Record Number: [id number] Patient Account Number: [id number] Date of Birth/Sex: [birth year] (76 y.o. F) Treating RN: Carlene Coria Primary Care Provider: [name] Other Clinician: Referring Provider: [name] Treating Provider/Extender: Skipper Cliche in Treatment: 1 Constitutional Obese and well-hydrated in no acute distress. Respiratory normal breathing without difficulty. Psychiatric this patient is able to make decisions and demonstrates good insight into disease process. Alert and Oriented x 3. pleasant and cooperative. Notes Upon inspection patient's wound bed actually showed signs of good healing in regard to the leg her wound is significantly better and overall seems to be doing well based on what I am seeing currently. I do not see any evidence of active infection locally or systemically which is great news. No fevers, chills, nausea, vomiting, or diarrhea. 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 Electronic Signature(s) Signed: 02/25/2022 2:38:58 PM By: Worthy Keeler PA-C Entered By: Worthy Keeler on 02/25/2022 14:38:58 [name] ([id number]) -------------------------------------------------------------------------------- Physician Orders Details Patient Name: [name] Date of Service: 02/25/2022 2:00 PM Medical Record Number: [id number] Patient Account Number: [id number] Date of Birth/Sex: [birth year] (76 y.o. 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 o Nurse Visit as needed Bathing/ Shower/ Hygiene o May shower with wound dressing protected with water repellent cover or cast protector. o No tub bath. Anesthetic (Use 'Patient Medications' Section for Anesthetic Order Entry) o Lidocaine applied to wound bed Edema Control - Lymphedema / Segmental Compressive Device / Other Bilateral Lower Extremities o Optional: One layer of unna paste to top of compression wrap (to act as an anchor). - she needs this o Patient to wear own Velcro compression garment. Remove compression stockings every night before going to bed and put on every morning when getting up. - left leg o Elevate, Exercise Daily and Avoid Standing for Long Periods of Time. o Elevate legs to the level of the heart and pump ankles as often as possible o Elevate leg(s) parallel to the floor when sitting. o Compression Pump: Use compression pump on left lower extremity for 60 minutes, twice daily. - You may pump over wraps o Compression Pump: Use compression pump on right lower extremity for 60 minutes, twice daily. - You may pump over wraps Additional Orders / Instructions o Follow Nutritious Diet and Increase Protein Intake Wound Treatment Wound #4 - Lower Leg Wound Laterality: Right, Lateral Cleanser: Soap and Water 1 x Per Week/30 Days Discharge Instructions: Gently cleanse wound with antibacterial soap, rinse and pat dry prior to dressing wounds Cleanser:  Wound Cleanser 1 x Per Week/30 Days Discharge Instructions: Wash your hands with soap and water. Remove old dressing, discard into plastic bag and place into trash. Cleanse the wound with Wound Cleanser prior to applying a clean dressing using  gauze sponges, not tissues or cotton balls. Do not scrub or use excessive force. Pat dry using gauze sponges, not tissue or cotton balls. Peri-Wound Care: Desitin Maximum Strength Ointment 4 (oz) 1 x Per Week/30 Days Discharge Instructions: Apply around wound edges to avoid maceration Topical: Gentamicin 1 x Per Week/30 Days Discharge Instructions: Apply as directed by provider. Primary Dressing: Silvercel Small 2x2 (in/in) 1 x Per Week/30 Days Discharge Instructions: Apply Silvercel Small 2x2 (in/in) as instructed Secondary Dressing: ABD Pad 5x9 (in/in) 1 x Per Week/30 Days Discharge Instructions: Cover with ABD pad Compression Wrap: 3-LAYER WRAP - Profore Lite LF 3 Multilayer Compression Bandaging System 1 x Per Week/30 Days Discharge Instructions: Apply 3 multi-layer wrap as prescribed. [place], [place] ([id number]) Compression Stockings: Circaid Juxta Lite Compression Wrap Right Leg Compression Amount: 20-30 mmHG Discharge Instructions: Apply Circaid Juxta Lite Compression Wrap as directed Electronic Signature(s) Unsigned Entered By: Carlene Coria on 02/25/2022 14:32:14 Signature(s): Date(s): [name] ([id number]) -------------------------------------------------------------------------------- Problem List Details Patient Name: [name], [name] Date of Service: 02/25/2022 2:00 PM Medical Record Number: [id number] Patient Account Number: [id number] Date of Birth/Sex: [birth year] (76 y.o. F) Treating RN: Carlene Coria Primary Care Provider: [name] Other Clinician: Referring Provider: [name] Treating Provider/Extender: Skipper Cliche in Treatment: 57 Active Problems ICD-10 Encounter Code Description Active Date MDM Diagnosis I89.0 Lymphedema, not elsewhere classified 01/16/2021 No Yes I87.332 Chronic venous hypertension (idiopathic) with ulcer and inflammation of 01/16/2021 No Yes left lower extremity L97.812 Non-pressure chronic ulcer of other part of right  lower leg with fat layer 01/07/2022 No Yes exposed Inactive Problems ICD-10 Code Description Active Date Inactive Date L97.822 Non-pressure chronic ulcer of other part of left lower leg with fat layer exposed 01/16/2021 01/16/2021 Resolved Problems Electronic Signature(s) Signed: 02/25/2022 2:04:00 PM By: Worthy Keeler PA-C Entered By: Worthy Keeler on 02/25/2022 14:04:00 [name] ([id number]) -------------------------------------------------------------------------------- Progress Note Details Patient Name: [name] Date of Service: 02/25/2022 2:00 PM Medical Record Number: [id number] Patient Account Number: [id number] Date of Birth/Sex: 1946/08/14 (76 y.o. F) Treating RN: Carlene Coria Primary Care Provider: [name] Other Clinician: Referring Provider: [name] Treating Provider/Extender: Skipper Cliche in Treatment: 57 Subjective Chief Complaint Information obtained from Patient Bilateral LE Ulcers History of Present Illness (HPI) 03/15/2020 upon evaluation today patient presents today for initial evaluation here in our clinic concerning a wound that is on the left posterior lower extremity. Unfortunately this has been giving the patient some discomfort at this point which she notes has been affected her pretty much daily. The patient does have a history of venous insufficiency, hypertension, congestive heart failure, and a history of breast cancer. Fortunately there does not appear to be evidence of active infection at this time which is great news. No fevers, chills, nausea, vomiting, or diarrhea. Wound is not extremely large but does have some slough covering the surface of the wound. This is going require sharp debridement today. 03/15/2020 upon evaluation today patient appears to be doing fairly well in regard to her wound. She does have some slough noted on the surface of the wound currently but I do believe the Iodoflex has been beneficial over the past  week. There is no signs of active infection at this time which is great news and overall  very pleased with the progress. No fevers, chills, nausea, vomiting, or diarrhea. 04/02/2020 upon evaluation today patient appears to be doing a little better in regard to her wound size wise this is not tremendously smaller she does have some slough buildup on the surface of the wound. With that being said this is going require some sharp debridement today to clear away some of the necrotic debris. Fortunately there is no evidence of active infection at this time. No fevers, chills, nausea, vomiting, or diarrhea. 04/10/2020 on evaluation today patient appears to be doing well with regard to her ulcer which is measuring somewhat smaller today. She actually has more granulation tissue noted which is also good news is no need for sharp debridement today. Fortunately there is no evidence of infection either which is also excellent. No fevers, chills, nausea, vomiting, or diarrhea. 04/26/2020 on evaluation today patient's wound actually is showing signs of good improvement which is great news. There does not appear to be any evidence of active infection. I do believe she is tolerating the collagen at this point. 05/03/2020 upon evaluation today patient's wound actually showed signs of good granulation at this time there does not appear to be any evidence of active infection which is great news and overall very pleased with where things stand. With that being said I do believe that the patient is can require some sharp debridement today but fortunately nothing too significant. 05/11/20 on evaluation today patient appears to be doing well in regard to her leg ulcer. She's been tolerating the dressing changes without complication. Fortunately there is no signs of active infection at this time. Overall I'm very pleased with where things stand. 05/18/2020 upon evaluation today patient's wound is showing signs of improvement is  very dry and I think the collagen for the most part just dried to the wound bed. I am going to clean this away with sharp debridement in order to get this under control in my opinion. 05/25/2020 upon evaluation today patient actually appears to be doing well in regard to her leg ulcer. In fact upon inspection it appears she could potentially be completely healed but that is not guaranteed based on what we are seeing. There does not appear to be any signs of active infection which is great news. 05/31/2020 on evaluation today patient appears to be doing well with regard to her wounds. She in fact appears to be almost completely healed on the left leg there is just a very small opening remaining point 1 06/08/2020 upon evaluation today patient appears to be doing excellent in regard to her leg ulcer on the left in fact it appears to be that she is completely healed as of today. I see no signs of active infection at this time which is great news. No fevers, chills, nausea, vomiting, or diarrhea. READMISSION 01/16/2021 Patient that we have had in this clinic with a wound on the left posterior calf in the summer 2021 extending into October. She was felt to have chronic venous insufficiency. Per the patient's description she was discharged with what sounds like external compression stockings although she could not afford the "$75 per leg". She therefore did not get anything. Apparently her wound that she has currently is been in existence since January. She has been followed at vein and vascular with Unna boots and I think calcium alginate. She had ABIs done on 06/25/2020 that showed an noncompressible ABI on the right leg and the left leg but triphasic waveforms with good great  toe pressures and a TBI of 0.90 on the right and 0.91 on the left Surprisingly the patient also had venous reflux studies that were really unremarkable. This included no evidence of DVTs or SVTs but there was no evidence of deep  venous insufficiency or superficial venous insufficiency in the greater saphenous or short saphenous veins bilaterally. I would wonder about more central venous issues or perhaps this is all lymphedema 01/25/2021 upon evaluation today patient appears to be doing decently well and guard to her wound. The good news is the Iodoflex that is job she saw Dr. Dellia Nims last week for readmission and to be honest I think that this has done extremely well over that week. I do believe that she can tolerate the sharp debridement today which will be good. I think that cleaning off the wound will likely allow [place] to be able to use a different type of dressing I do not think the Iodoflex will even be necessary based on how clean the wound looks. Fortunately there is no sign of active infection at this time which is great news. No fevers, chills, nausea, vomiting, or diarrhea. [name], [name] ([id number]) 02/12/2021 upon evaluation today patient appears to be doing well with regard to her leg ulcer. We did switch to alginate when she came for nurse visit I think is doing much better for her. Fortunately there does not appear to be any signs of active infection at this time which is great news. No fevers, chills, nausea, vomiting, or diarrhea. 02/18/2021 upon evaluation today patient's wound is actually showing signs of good improvement. I am very pleased with where things stand currently. I do not see any signs of active infection which is great news and overall I feel like she is making good progress. The patient likewise is happy that things are doing so well and that this is measuring somewhat smaller. 02/25/2021 upon evaluation today patient actually appears to be doing quite well in regard to her wounds. She has been tolerating the dressing changes without complication. Fortunately there does not appear to be any signs of active infection which is great news and overall I am extremely pleased with where things stand.  No fevers, chills, nausea, vomiting, or diarrhea. She does have a lot of edema I really do think she needs compression socks to be worn in order to prevent things from causing additional issues for her currently. Especially on the right leg she should be wearing compression socks all the time to be honest. 03/04/2021 upon evaluation today patient's leg ulcer actually appears to be doing pretty well which is great news. There does not appear to be any significant change but overall the appearance is better even though the size is not necessarily reflecting a great improvement. Fortunately there does not appear to be any signs of active infection. No fevers, chills, nausea, vomiting, or diarrhea. 03/12/2021 upon evaluation today patient appears to be doing well with regard to her wounds. She has been tolerating the dressing changes without complication. The good news is that the wound on her left lateral leg is doing much better and is showing signs of good improvement. Overall her swelling is controlled well as well. She does need some compression socks she plans to go Jefferey socks next week. 03/18/2021 on evaluation today patient's wound actually is showing signs of good improvement. She does have a little bit of slough this can require some sharp debridement today. 03/25/2021 upon evaluation today patient appears to be doing well with  regard to her wound on the left lateral leg. She has been tolerating the dressing changes without complication. Fortunately there does not appear to be any signs of active infection at this time. No fever chills no 04/01/2021 upon evaluation today patient's wound is showing signs of improvement and overall very pleased with where things stand at this point. There is no evidence of active infection which is great news as well and in general I think that she is making great progress. 04/09/2021 upon evaluation today patient appears to be doing well with regard to her ankle  ulcer. She is making good progress currently which is great news. There does not appear to be any signs of infection also excellent news. In general I am extremely pleased with where things stand today. No fevers, chills, nausea, vomiting, or diarrhea. 04/23/2021 upon inspection today patient appears to be doing quite well with regard to her leg ulcer. She has been tolerating the dressing changes without complication. She is can require some sharp debridement today but overall seems to be doing excellent. 05/06/2021 upon evaluation today patient unfortunately appears to be doing poorly overall in regard to her swelling. She is actually significantly more swollen than last week despite the fact that her wound is doing better this is not a good trend. I think that she probably needs to see her cardiologist ASAP and I discussed that with her today. This includes the fact that honestly I think she probably is getting need an increase in her diuretic or something to try to clear away some of the excess fluid that she is experiencing at the moment. She is not been doing her pumps even twice a day but again right now more concerned that if she were to do the pump she would fluid overload her lungs causing other issues as far as her breathing is concerned. Obviously I think she needs to contact them and move up the appointment for the 12th of something sooner 2 weeks is a bit too long to wait with how swollen she has. 05/14/2021 upon evaluation today patient's wound actually showing signs of being a little bit smaller compared to previous. She has been making good progress however. Fortunately there does not appear to be any evidence of active infection which is great. Overall I am extremely pleased in that regard. Nonetheless I am still concerned that she is having quite a bit of issue as far as the swelling is concerned she has been placed on fluid restriction as well as an increase in the furosemide by her  doctor. We will see how things progress. 05/21/2021 upon evaluation today patient appears to be doing well with regard to her wound in general. She has been tolerating the dressing changes without complication. With that being said she has been using silver cell for a while and while this was showing signs of improvement it is now gotten to the point where this has slowed down quite significantly. I do believe that switching to a different dressing may be beneficial for her today. 05/28/2021 upon evaluation today patient actually appears to be doing quite well in regard to her wound. In fact there is really not any need for sharp debridement today based on what I see. The Hydrofera Blue is doing great and overall I think that she is managing quite nicely with the compression wrapping. Her edema is still down quite a bit with the wrapping in place. 06/11/2021 upon evaluation today patient appears to be doing well with  regard to her leg ulcer. She has been tolerating the dressing changes without complication. Fortunately there does not appear to be any signs of active infection at this time which is great news. No fevers, chills, nausea, vomiting, or diarrhea. She is going require some sharp debridement today. 10/11; left leg ulcer much better looking and with improved surface area. She is using Hydrofera Blue under 3 layer compression. She does not have any stocking on the right leg which in itself has very significant nonpitting edema 06/25/2021 upon evaluation today patient appears to be doing well with regard to the wound on her left leg. She has been tolerating the dressing changes without complication and overall I am extremely pleased with where things stand today. I do not see any evidence of infection which is great news. 07/02/2021 upon evaluation today patient's wound actually showing signs of excellent improvement and actually very pleased with where we stand today and the overall appearance.  Fortunately there does not appear to be any signs of active infection. No fevers, chills, nausea, vomiting, or diarrhea. 07/09/2021 upon evaluation today patient appears to be doing better in regard to her wound this is measuring smaller and she is headed in the appropriate direction based on what I am seeing currently. I do not see any evidence of active infection systemically which is great news. 07/16/2021 upon evaluation today patient appears to be doing pretty well currently in regard to her leg ulcer. This is measuring smaller and looking much better this is great news. Fortunately there does not appear to be any evidence of active infection systemically which is great news [name], [name] ([id number]) as well. 07/23/2021 upon evaluation today patient's wound is actually showing signs of good improvement this is definitely measuring smaller. I do not see any signs of infection which is great news and overall very pleased in that regard. Overall I think that she is doing well with the [facility]. 07/30/2020 upon evaluation today patient appears to be doing well in regard to her wound. She has been tolerating the dressing changes without complication think the Hydrofera Blue at this point is actually causing her to dry out a lot as far as the wound bed is concerned. Subsequently I think that we need to try to see what we can do to improve this. Fortunately there does not appear to be any evidence of active infection at this time which is great news. 08/06/2021 upon evaluation today patient appears to be doing well with regards to her wound. Is not measuring significantly smaller initial inspection but upon closer inspection she has a lot of new skin growth across the central portion of the wound. I think will get very close to complete resolution. 08/13/2021 upon evaluation today patient appears to be doing well with regard to her wound. In fact this appears to be I believe healed  although there is still some question as to whether it is completely so. I am concerned about the fact that to be honest she still has some evidence of dry skin that could be just trapping something underneath I still want to debride anything as I am afraid of causing some damage to the good skin for that reason I Georgina Peer probably monitor for 1 more week before completely closing everything out. 08/20/2021 upon evaluation today patient actually appears to be doing excellent. In fact she appears to be completely healed. This was the case last week as well we just wanted to make sure nothing reopened since she  does not really have an ability to put on compression socks this is good to be something we need to make sure was well-healed. Nonetheless I think we have achieved that goal as of today. 12/27; this is a patient we discharged 2 weeks ago with wounds on her left lower leg laterally. She was supposed to get stockings and really did not get them. She developed blistering and reopening probably sometime late last week. She has 3 areas in the same area as previously. The mid calf dimensions on the left went from 39 to 51 cm today. She also has very significant edema on the right leg but as far as I am aware has not had wounds in this area 1/4; patient presents for follow-up. She has tolerated the compression wrap well. She has no issues or complaints today. 09/17/2021 upon evaluation today patient appears to be doing well with regard to her wounds. She has been tolerating the dressing changes without complication. Fortunately there does not appear to be any signs of active infection at this time. No fevers, chills, nausea, vomiting, or diarrhea. 10/01/2021 upon evaluation today patient appears to be doing well with regard to her wound this is actually measuring better and looking smaller and I am very pleased in that regard. Fortunately I do not see any evidence of active infection locally nor  systemically at this point. No fevers, chills, nausea, vomiting, or diarrhea. 10/08/2021 upon evaluation patient's wounds are actually showing some signs of improvement measuring a little bit smaller and looking a little bit better. Overall I do not think there is any need for sharp debridement today which is good news. No fevers, chills, nausea, vomiting, or diarrhea. 10/14/2021 upon evaluation today patient appears to be doing well with regard to her wound. She has been tolerating the dressing changes without complication and overall I am extremely pleased with where things stand today. There is a little bit of film on the surface of the wounds but this was carefully cleaned away with just saline and gauze she really did not want me to do any sharp debridement today. 10/29/2021 upon evaluation today patient appears to be doing quite well in regard to her wounds. She is actually showing signs of excellent improvement which is great news and overall I feel like we are on the right track at this time. She does have a wound on both legs and she does need something compression wise when she heals for that reason we will get a go and see about ordering her bilateral juxta lite compression wraps. 11/05/2021 upon evaluation today patient appears to be doing well with regard to her wound. She has been tolerating the dressing changes without complication. Fortunately I do not see any evidence of active infection locally nor systemically at this time which is great news. No fevers, chills, nausea, vomiting, or diarrhea. 11/12/2021 upon evaluation today patient appears to be doing well with regard to her wounds. She is doing excellent on the left on the right this is not doing quite as well there is some slough and biofilm buildup. 3/14; patient presents for follow-up. She has no issues or complaints today. She has tolerated the compression wraps well. 11/26/2021 upon evaluation today patient appears to be doing well  with regard to her wounds. Both are showing signs of good improvement which is great news. I do not see any evidence of active infection and overall think she is healing quite nicely. 12/03/2021 upon evaluation today patient appears to be  doing well with regard to her wounds. Both are showing signs of excellent improvement I am very pleased with where things stand and I think that she is making good progress here. Fortunately I do not see any signs of active infection locally or systemically which is great news. No fevers, chills, nausea, vomiting, or diarrhea. 12-10-2021 upon evaluation today patient's wounds actually appear to be doing awesome. I am extremely pleased with where we stand I think she is making excellent progress. I do not see any signs of active infection locally or systemically at this time. 12-17-2021 upon evaluation today patient's wounds are actually showing signs of good improvement. Fortunately I do not see any evidence of active infection locally nor systemically which is great news. Overall I do not believe that she is showing any signs of significant worsening which is good news. With that being said I do believe that she has a little bit of film on the surface of the wound I was actually able to clear this away with saline and gauze no sharp debridement was necessary. 12-24-2021 upon evaluation today patient appears to be doing well with regard to her wounds. I do not see any signs of active infection currently which is great news and overall I think that we are on the right track here. No fevers, chills, nausea, vomiting, or diarrhea. 12-31-2021 upon evaluation today patient appears to be doing well with regard to her wounds. Both are showing signs of improvement the wound [name], [name] ([id number]) on the right leg is going require some sharp debridement on the left seems to be doing quite well. 01-07-2022 upon evaluation today patient appears to be doing well currently in  regard to her wounds. She is actually making good progress bilaterally. Both of them require little bit of sharp debridement but not too much which is great news. Upon inspection patient's wound bed actually showed signs of good granulation and epithelization on both legs. She still is having significant issues with the right leg compared to the left although I do feel like both are actually showing signs of pretty good improvement. 01-21-2022 upon evaluation today patient's wound is actually showing signs of excellent improvement I am very pleased with where we stand currently. I do not see any signs of active infection locally or systemically which is great news. No fevers, chills, nausea, vomiting, or diarrhea. 01-28-2022 upon evaluation today patient appears to be doing well with regard to her wound on the left leg this is pretty much just about healed which is great news. Unfortunately in regard to the wound on her right leg this is not doing nearly as well in fact I think we probably need to see about a culture today that was discussed with her. 02-04-2022 upon evaluation today patient appears to be doing well with regard to her left leg which in fact might actually be completely healed I am not 100% sure. Nonetheless this is shown signs of excellent improvement which is great news. With that being said in regards to the right leg there is some slough and film buildup on the surface of the wound although she is feeling much better than last week this does appear to be doing much better than it was last week. Fortunately there does not appear to be any signs of active infection locally or systemically at this time. 02-11-2022 upon evaluation today patient's wounds appear to be doing decently well. In fact the left leg is healed although there is brand-new skin  were definitely given still need to wrap her for the time being. On the right leg this is showing signs of some epithelial growth in the middle  of the wound it still measures the same because there is still a larger area with speckled openings but again as far as the entire area being open this is significantly improved which is great news. I am very pleased. 6/13; left leg remains healed and she has a juxta light to apply to it today. There is some concern about whether she is going to be able to do this at home. She lives with a daughter although she has her own disability. On the right the wound looks smaller to me nice epithelialization good edema control. 02-25-2022 upon evaluation today patient appears to be doing well with regard to her legs the left leg is still healed the right leg is doing much better. I think we are on the right track here. With that being said unfortunately she continues to have significant issues with some other problems and she actually tells me on Saturday she was having lunch and went inside to sit down when she tells me she had all of a sudden a sharp sensation that went from her hand through her arm into her leg and her neck and she tells me that she could not see anything for the next hour it was completely black. Slowly after this her vision started to return and has been normal since. It sounds to me as if she may have had a TIA or mini stroke at this point. Nonetheless I think that she needs to have this checked out ASAP. Her primary care provider is Dr. Clide Deutscher at [facility]. We will get a try to get in touch with him. Objective Constitutional Obese and well-hydrated in no acute distress. Vitals Time Taken: 2:14 PM, Height: 69 in, Weight: 244 lbs, BMI: 36, Temperature: 97.7 F, Pulse: 61 bpm, Respiratory Rate: 18 breaths/min, Blood Pressure: 141/83 mmHg. Respiratory normal breathing without difficulty. Psychiatric this patient is able to make decisions and demonstrates good insight into disease process. Alert and Oriented x 3. pleasant and cooperative. General Notes: Upon inspection patient's  wound bed actually showed signs of good healing in regard to the leg her wound is significantly better and overall seems to be doing well based on what I am seeing currently. I do not see any evidence of active infection locally or systemically which is great news. No fevers, chills, nausea, vomiting, or diarrhea. 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 Integumentary (Hair, Skin) Wound #4 status is Open. Original cause of wound was Gradually Appeared. The date acquired was: 10/27/2021. The wound has been in treatment 17 weeks. The wound is located on the Right,Lateral Lower Leg. The wound measures 4cm length x 5cm width x 0.1cm depth; 15.708cm^2 area and 1.571cm^3 volume. There is no tunneling or undermining noted. There is a medium amount of serosanguineous drainage noted. There is medium (34-66%) pink granulation within the wound bed. There is a medium (34-66%) amount of necrotic tissue within the wound bed including Adherent Slough. 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 There was a Three Layer Compression Therapy Procedure by Carlene Coria, RN. Post procedure Diagnosis Wound #4: Same as Pre-Procedure Plan Follow-up Appointments: Return Appointment in 1 week. Nurse Visit as needed Bathing/ Shower/ Hygiene: May shower with wound dressing protected with water repellent cover or cast protector. No tub  bath. Anesthetic (Use 'Patient Medications' Section for Anesthetic Order Entry): Lidocaine applied to wound bed Edema Control - Lymphedema / Segmental Compressive Device / Other: Optional: One layer of unna paste to top of compression wrap (to act as an anchor). - she needs this Patient to wear own Velcro compression garment. Remove compression stockings every night before going to bed and put on every morning when getting up. - left leg Elevate, Exercise Daily and Avoid Standing for Long Periods of Time. Elevate legs to the level of the heart and pump ankles as often as possible Elevate leg(s) parallel to the floor when sitting. Compression Pump: Use compression pump on left lower extremity for 60 minutes, twice daily. - You may pump over wraps Compression Pump: Use compression pump on right lower extremity for 60 minutes, twice daily. - You may pump over wraps Additional Orders / Instructions: Follow Nutritious Diet and Increase Protein Intake WOUND #4: - Lower Leg Wound Laterality: Right, Lateral Cleanser: Soap and Water 1 x Per Week/30 Days Discharge Instructions: Gently cleanse wound with antibacterial soap, rinse and pat dry prior to dressing wounds Cleanser: Wound Cleanser 1 x Per Week/30 Days Discharge Instructions: Wash your hands with soap and water. Remove old dressing, discard into plastic bag and place into trash. Cleanse the wound with Wound Cleanser prior to applying a clean dressing using gauze sponges, not tissues or cotton balls. Do not scrub or use excessive force. Pat dry using gauze sponges, not tissue or cotton balls. Peri-Wound Care: Desitin Maximum Strength Ointment 4 (oz) 1 x Per Week/30 Days Discharge Instructions: Apply around wound edges to avoid maceration Topical: Gentamicin 1 x Per Week/30 Days Discharge Instructions: Apply as directed by provider. Primary Dressing: Silvercel Small 2x2 (in/in) 1 x Per Week/30 Days Discharge Instructions: Apply Silvercel Small  2x2 (in/in) as instructed Secondary Dressing: ABD Pad 5x9 (in/in) 1 x Per Week/30 Days Discharge Instructions: Cover with ABD pad Compression Wrap: 3-LAYER WRAP - Profore Lite LF 3 Multilayer Compression Bandaging System 1 x Per Week/30 Days Discharge Instructions: Apply 3 multi-layer wrap as prescribed. Compression Stockings: Circaid Juxta Lite Compression Wrap Compression Amount: 20-30 mmHg (right) Discharge Instructions: Apply Circaid Juxta Lite Compression Wrap as directed 1. Based on what I am seeing currently I would recommend that we go ahead and continue with the wound care measures as before with regard to the wound I think this is doing quite well and this includes the use of the silver alginate dressing followed by an ABD pad and 3 layer compression wrap. 2. Also can recommend that the patient continue with the juxta lite wrap on the left leg. 3. With regard to her issue with what sounds to be possibly mini stroke symptoms over the weekend I do want her to follow-up with her primary [name], [name] ([id number]) care provider and we are calling them right now in the office to relay information and see if I can talk with Dr. Clide Deutscher. We will also have the patient try to call and get through to them in  case were not able to but nonetheless as finishes note right now she lives on the phone trying to get somebody to come to the phone in order to relay the information and explain what is going on in the meantime if the patient has any repeat of the symptoms she is to go to the ER ASAP. We will see patient back for reevaluation in 1 week here in the clinic. If anything worsens or changes patient will contact our office for additional recommendations. Electronic Signature(s) Signed: 02/25/2022 2:40:02 PM By: Worthy Keeler PA-C Entered By: Worthy Keeler on 02/25/2022 14:40:02 [name]  ([id number]) -------------------------------------------------------------------------------- SuperBill Details Patient Name: [name] Date of Service: 02/25/2022 Medical Record Number: [id number] Patient Account Number: [id number] Date of Birth/Sex: 11/26/1945 (76 y.o. F) Treating RN: Carlene Coria Primary Care Provider: [name] Other Clinician: Referring Provider: [name] Treating Provider/Extender: Skipper Cliche in Treatment: 57 Diagnosis Coding ICD-10 Codes Code Description I89.0 Lymphedema, not elsewhere classified I87.332 Chronic venous hypertension (idiopathic) with ulcer and inflammation of left lower extremity L97.812 Non-pressure chronic ulcer of other part of right lower leg with fat layer exposed Facility Procedures CPT4 Code: [id number] Description: (Facility Use Only) [phone] - [place] [facility] RT LEG Modifier: Quantity: 1 Physician Procedures CPT4 Code Description: [id number] - WC PHYS LEVEL 4 - EST PT Modifier: Quantity: 1 CPT4 Code Description: ICD-10 Diagnosis Description I89.0 Lymphedema, not elsewhere classified I87.332 Chronic venous hypertension (idiopathic) with ulcer and inflammation of L97.812 Non-pressure chronic ulcer of other part of right lower leg with fat  lay Modifier: left lower extremity er exposed Quantity: Electronic Signature(s) Signed: 02/25/2022 2:40:16 PM By: Worthy Keeler PA-C Entered By: Worthy Keeler on 02/25/2022 14:40:16

## 2022-02-25 NOTE — Progress Notes (Signed)
[name], [name] ([id number]) Visit Report for 02/25/2022 Arrival Information Details Patient Name: [name], [name] Date of Service: 02/25/2022 2:00 PM Medical Record Number: [id number] Patient Account Number: [id number] Date of Birth/Sex: 1946-01-13 (76 y.o. F) Treating RN: Carlene Coria Primary Care Yianni Skilling: [name] Other Clinician: Referring Alsie Younes: [name] Treating Nicola Heinemann/Extender: Skipper Cliche in Treatment: 3 Visit Information History Since Last Visit All ordered tests and consults were completed: No Patient Arrived: Wheel Chair Added or deleted any medications: No Arrival Time: 13:59 Any new allergies or adverse reactions: No Accompanied By: self Had a fall or experienced change in No Transfer Assistance: None activities of daily living that may affect Patient Identification Verified: Yes risk of falls: Secondary Verification Process Completed: Yes Signs or symptoms of abuse/neglect since last visito No Patient Requires Transmission-Based Precautions: No Hospitalized since last visit: No Patient Has Alerts: Yes Implantable device outside of the clinic excluding No Patient Alerts: NOT DIABETIC cellular tissue based products placed in the center since last visit: Has Dressing in Place as Prescribed: Yes Pain Present Now: No Electronic Signature(s) Unsigned Entered ByCarlene Coria on 02/25/2022 14:12:40 Signature(s): Date(s): [name] ([id number]) -------------------------------------------------------------------------------- Clinic Level of Care Assessment Details Patient Name: [name], [name] Date of Service: 02/25/2022 2:00 PM Medical Record Number: [id number] Patient Account Number: [id number] Date of Birth/Sex: 09-Dec-1945 (76 y.o. F) Treating RN: Carlene Coria Primary Care Bee Marchiano: [name] Other Clinician: Referring Davon Abdelaziz: [name] Treating Oberia Beaudoin/Extender: Skipper Cliche in Treatment: 57 Clinic Level of Care  Assessment Items TOOL 1 Quantity Score []  - Use when EandM and Procedure is performed on INITIAL visit 0 ASSESSMENTS - Nursing Assessment / Reassessment []  - General Physical Exam (combine w/ comprehensive assessment (listed just below) when performed on new 0 pt. evals) []  - 0 Comprehensive Assessment (HX, ROS, Risk Assessments, Wounds Hx, etc.) ASSESSMENTS - Wound and Skin Assessment / Reassessment []  - Dermatologic / Skin Assessment (not related to wound area) 0 ASSESSMENTS - Ostomy and/or Continence Assessment and Care []  - Incontinence Assessment and Management 0 []  - 0 Ostomy Care Assessment and Management (repouching, etc.) PROCESS - Coordination of Care []  - Simple Patient / Family Education for ongoing care 0 []  - 0 Complex (extensive) Patient / Family Education for ongoing care []  - 0 Staff obtains Programmer, systems, Records, Test Results / Process Orders []  - 0 Staff telephones HHA, Nursing Homes / Clarify orders / etc []  - 0 Routine Transfer to another Facility (non-emergent condition) []  - 0 Routine Hospital Admission (non-emergent condition) []  - 0 New Admissions / Biomedical engineer / Ordering NPWT, Apligraf, etc. []  - 0 Emergency Hospital Admission (emergent condition) PROCESS - Special Needs []  - Pediatric / Minor Patient Management 0 []  - 0 Isolation Patient Management []  - 0 Hearing / Language / Visual special needs []  - 0 Assessment of Community assistance (transportation, D/C planning, etc.) []  - 0 Additional assistance / Altered mentation []  - 0 Support Surface(s) Assessment (bed, cushion, seat, etc.) INTERVENTIONS - Miscellaneous []  - External ear exam 0 []  - 0 Patient Transfer (multiple staff / Civil Service fast streamer / Similar devices) []  - 0 Simple Staple / Suture removal (25 or less) []  - 0 Complex Staple / Suture removal (26 or more) []  - 0 Hypo/Hyperglycemic Management (do not check if billed separately) []  - 0 Ankle / Brachial Index (ABI) - do not  check if billed separately Has the patient been seen at the hospital within the last three years: Yes Total Score: 0 Level Of Care: ____ Wynetta Emery,  Lorisa ([id number]) Electronic Signature(s) Unsigned Entered ByCarlene Coria on 02/25/2022 14:32:30 Signature(s): Date(s): [name] ([id number]) -------------------------------------------------------------------------------- Compression Therapy Details Patient Name: [name], [name] Date of Service: 02/25/2022 2:00 PM Medical Record Number: [id number] Patient Account Number: [id number] Date of Birth/Sex: 27-May-1946 (76 y.o. F) Treating RN: Carlene Coria Primary Care Toia Micale: [name] Other Clinician: Referring Yocelyn Brocious: [name] Treating Azar [place]/Extender: Skipper Cliche in Treatment: 57 Compression Therapy Performed for Wound Assessment: Wound #4 Right,Lateral Lower Leg Performed By: Clinician Carlene Coria, RN Compression Type: Three Layer Post Procedure Diagnosis Same as Pre-procedure Electronic Signature(s) Unsigned Entered ByCarlene Coria on 02/25/2022 14:29:59 Signature(s): Date(s): ARIHANNA, ESTABROOK ([id number]) -------------------------------------------------------------------------------- Encounter Discharge Information Details Patient Name: [name], [name] Date of Service: 02/25/2022 2:00 PM Medical Record Number: [id number] Patient Account Number: [id number] Date of Birth/Sex: 09/16/1945 (76 y.o. F) Treating RN: Carlene Coria Primary Care Milley Vining: [name] Other Clinician: Referring Joffrey Kerce: [name] Treating Alida Greiner/Extender: Skipper Cliche in Treatment: 44 Encounter Discharge Information Items Discharge Condition: Stable Ambulatory Status: Wheelchair Discharge Destination: Home Transportation: Private Auto Accompanied By: self Schedule Follow-up Appointment: Yes Clinical Summary of Care: Electronic Signature(s) Unsigned Entered ByCarlene Coria on 02/25/2022  14:33:56 Signature(s): Date(s): [name], [name] ([id number]) -------------------------------------------------------------------------------- Lower Extremity Assessment Details Patient Name: [name], [name] Date of Service: 02/25/2022 2:00 PM Medical Record Number: [id number] Patient Account Number: [id number] Date of Birth/Sex: [birth year] (76 y.o. F) Treating RN: Carlene Coria Primary Care Midori Dado: [name] Other Clinician: Referring Choice Kleinsasser: [name] Treating Anikin Prosser/Extender: Skipper Cliche in Treatment: 57 Edema Assessment Assessed: [Left: No] [Right: No] Edema: [Left: Ye] [Right: s] Calf Left: Right: Point of Measurement: 31 cm From Medial Instep 37 cm Ankle Left: Right: Point of Measurement: 10 cm From Medial Instep 27 cm Vascular Assessment Pulses: Dorsalis Pedis Palpable: [Right:Yes] Electronic Signature(s) Unsigned Entered ByCarlene Coria on 02/25/2022 14:21:31 Signature(s): Date(s): [name] ([id number]) -------------------------------------------------------------------------------- Multi Wound Chart Details Patient Name: [name], [name] Date of Service: 02/25/2022 2:00 PM Medical Record Number: [id number] Patient Account Number: [id number] Date of Birth/Sex: [birth year] (76 y.o. F) Treating RN: Carlene Coria Primary Care Leam Madero: [name] Other Clinician: Referring Aalyiah Camberos: [name] Treating Lonn Im/Extender: Skipper Cliche in Treatment: 50 Vital Signs Height(in): 69 Pulse(bpm): 61 Weight(lbs): 244 Blood Pressure(mmHg): 141/83 Body Mass Index(BMI): 36 Temperature(F): 97.7 Respiratory Rate(breaths/min): 18 Photos: [N/A:N/A] Wound Location: Right, Lateral Lower Leg N/A N/A Wounding Event: Gradually Appeared N/A N/A Primary Etiology: Venous Leg Ulcer N/A N/A Comorbid History: Cataracts, Lymphedema, Sleep N/A N/A Apnea, Congestive Heart Failure, Hypertension, Osteoarthritis, Neuropathy, Received Chemotherapy,  Received Radiation Date Acquired: 10/27/2021 N/A N/A Weeks of Treatment: 17 N/A N/A Wound Status: Open N/A N/A Wound Recurrence: No N/A N/A Measurements L x W x D (cm) 4x5x0.1 N/A N/A Area (cm) : 15.708 N/A N/A Volume (cm) : 1.571 N/A N/A % Reduction in Area: -1233.40% N/A N/A % Reduction in Volume: -1231.40% N/A N/A Classification: Full Thickness Without Exposed N/A N/A Support Structures Exudate Amount: Medium N/A N/A Exudate Type: Serosanguineous N/A N/A Exudate Color: red, brown N/A N/A Granulation Amount: Medium (34-66%) N/A N/A Granulation Quality: Pink N/A N/A Necrotic Amount: Medium (34-66%) N/A N/A Exposed Structures: Fascia: No N/A N/A Fat Layer (Subcutaneous Tissue): No Tendon: No Muscle: No Joint: No Bone: No Epithelialization: None N/A N/A Treatment Notes Electronic Signature(s) Unsigned Entered ByCarlene Coria on 02/25/2022 14:22:14 [name] ([id number]) Signature(s): Date(s): [place], SURGES ([id number]) -------------------------------------------------------------------------------- Multi-Disciplinary Care Plan Details Patient Name: [name], [name] Date of Service: 02/25/2022 2:00 PM Medical Record Number: [id number] Patient Account Number: [id number]  Date of Birth/Sex: 12/09/1945 (76 y.o. F) Treating RN: Carlene Coria Primary Care Nevena Rozenberg: [name] Other Clinician: Referring Yoshiaki Kreuser: [name] Treating Jocelyne Reinertsen/Extender: Skipper Cliche in Treatment: 57 Active Inactive Necrotic Tissue Nursing Diagnoses: Impaired tissue integrity related to necrotic/devitalized tissue Knowledge deficit related to management of necrotic/devitalized tissue Goals: Necrotic/devitalized tissue will be minimized in the wound bed Date Initiated: 02/04/2022 Target Resolution Date: 02/04/2022 Goal Status: Active Patient/caregiver will verbalize understanding of reason and process for debridement of necrotic tissue Date Initiated: 02/04/2022 Target  Resolution Date: 02/04/2022 Goal Status: Active Interventions: Assess patient pain level pre-, during and post procedure and prior to discharge Provide education on necrotic tissue and debridement process Treatment Activities: Apply topical anesthetic as ordered : 02/04/2022 Excisional debridement : 02/04/2022 Notes: Pain, Acute or Chronic Nursing Diagnoses: Pain Management - Non-cyclic Acute (Procedural) Goals: Patient will verbalize adequate pain control and receive pain control interventions during procedures as needed Date Initiated: 02/04/2022 Target Resolution Date: 02/04/2022 Goal Status: Active Patient/caregiver will verbalize adequate pain control between visits Date Initiated: 02/04/2022 Target Resolution Date: 02/04/2022 Goal Status: Active Patient/caregiver will verbalize comfort level met Date Initiated: 02/04/2022 Target Resolution Date: 02/04/2022 Goal Status: Active Interventions: Complete pain assessment as per visit requirements Provide education on pain management Treatment Activities: Administer pain control measures as ordered : 02/04/2022 Notes: Electronic Signature(s) Unsigned Entered By: Carlene Coria on 02/25/2022 14:22:00 [name] ([id number]) Signature(s): Date(s): [name] ([id number]) -------------------------------------------------------------------------------- Pain Assessment Details Patient Name: [name], [name] Date of Service: 02/25/2022 2:00 PM Medical Record Number: [id number] Patient Account Number: [id number] Date of Birth/Sex: [birth year] (76 y.o. F) Treating RN: Carlene Coria Primary Care Kristianne Albin: [name] Other Clinician: Referring Joyell Emami: [name] Treating Trinitey Roache/Extender: Skipper Cliche in Treatment: 73 Active Problems Location of Pain Severity and Description of Pain Patient Has Paino No Site Locations Pain Management and Medication Current Pain Management: Electronic Signature(s) Unsigned Entered  ByCarlene Coria on 02/25/2022 14:13:13 Signature(s): Date(s): [name] ([id number]) -------------------------------------------------------------------------------- Patient/Caregiver Education Details Patient Name: [name] Date of Service: 02/25/2022 2:00 PM Medical Record Number: [id number] Patient Account Number: [id number] Date of Birth/Gender: 06-Dec-1945 (76 y.o. F) Treating RN: Carlene Coria Primary Care Physician: [name] Other Clinician: Referring Physician: [name] Treating Physician/Extender: Skipper Cliche in Treatment: 20 Education Assessment Education Provided To: Patient Education Topics Provided Pain: Methods: Explain/Verbal Responses: State content correctly Electronic Signature(s) Unsigned Entered By: Carlene Coria on 02/25/2022 14:33:22 Signature(s): Date(s): [name], MEUSER ([id number]) -------------------------------------------------------------------------------- Wound Assessment Details Patient Name: [name], [name] Date of Service: 02/25/2022 2:00 PM Medical Record Number: [id number] Patient Account Number: [id number] Date of Birth/Sex: 1946-07-22 (76 y.o. F) Treating RN: Carlene Coria Primary Care Dafney Farler: [name] Other Clinician: Referring Nguyen Butler: [name] Treating Octavie Westerhold/Extender: Skipper Cliche in Treatment: 57 Wound Status Wound Number: 4 Primary Venous Leg Ulcer Etiology: Wound Location: Right, Lateral Lower Leg Wound Open Wounding Event: Gradually Appeared Status: Date Acquired: 10/27/2021 Comorbid Cataracts, Lymphedema, Sleep Apnea, Congestive Heart Weeks Of Treatment: 17 History: Failure, Hypertension, Osteoarthritis, Neuropathy, Clustered Wound: No Received Chemotherapy, Received Radiation Photos Wound Measurements Length: (cm) 4 Width: (cm) 5 Depth: (cm) 0.1 Area: (cm) 15.708 Volume: (cm) 1.571 % Reduction in Area: -1233.4% % Reduction in Volume: -1231.4% Epithelialization:  None Tunneling: No Undermining: No Wound Description Classification: Full Thickness Without Exposed Support Structures Exudate Amount: Medium Exudate Type: Serosanguineous Exudate Color: red, brown Foul Odor After Cleansing: No Slough/Fibrino Yes Wound Bed Granulation Amount: Medium (34-66%) Exposed Structure Granulation Quality: Pink Fascia Exposed: No Necrotic Amount: Medium (34-66%) Fat  Layer (Subcutaneous Tissue) Exposed: No Necrotic Quality: Adherent Slough Tendon Exposed: No Muscle Exposed: No Joint Exposed: No Bone Exposed: No Treatment Notes Wound #4 (Lower Leg) Wound Laterality: Right, Lateral Cleanser Soap and Water Discharge Instruction: Gently cleanse wound with antibacterial soap, rinse and pat dry prior to dressing wounds Wound Cleanser [name], [name] ([id number]) Discharge Instruction: Wash your hands with soap and water. Remove old dressing, discard into plastic bag and place into trash. Cleanse the wound with Wound Cleanser prior to applying a clean dressing using gauze sponges, not tissues or cotton balls. Do not scrub or use excessive force. Pat dry using gauze sponges, not tissue or cotton balls. Peri-Wound Care Desitin Maximum Strength Ointment 4 (oz) Discharge Instruction: Apply around wound edges to avoid maceration Topical Gentamicin Discharge Instruction: Apply as directed by Richardo Popoff. Primary Dressing Silvercel Small 2x2 (in/in) Discharge Instruction: Apply Silvercel Small 2x2 (in/in) as instructed Secondary Dressing ABD Pad 5x9 (in/in) Discharge Instruction: Cover with ABD pad Secured With Compression Wrap 3-LAYER WRAP - Profore Lite LF 3 Multilayer Compression Bandaging System Discharge Instruction: Apply 3 multi-layer wrap as prescribed. Compression Stockings Circaid Juxta Lite Compression Wrap Quantity: 1 Right Leg Compression Amount: 20-30 mmHg Discharge Instruction: Apply Circaid Juxta Lite Compression Wrap as  directed Add-Ons Electronic Signature(s) Unsigned Entered By: Carlene Coria on 02/25/2022 14:20:37 Signature(s): Date(s): [name] ([id number]) -------------------------------------------------------------------------------- Vitals Details Patient Name: [name] Date of Service: 02/25/2022 2:00 PM Medical Record Number: [id number] Patient Account Number: [id number] Date of Birth/Sex: 20-Sep-1945 (76 y.o. F) Treating RN: Carlene Coria Primary Care Oluwatosin Higginson: [name] Other Clinician: Referring Clyda Smyth: [name] Treating Airyanna Dipalma/Extender: Skipper Cliche in Treatment: 57 Vital Signs Time Taken: 14:14 Temperature (F): 97.7 Height (in): 69 Pulse (bpm): 61 Weight (lbs): 244 Respiratory Rate (breaths/min): 18 Body Mass Index (BMI): 36 Blood Pressure (mmHg): 141/83 Reference Range: 80 - 120 mg / dl Electronic Signature(s) Unsigned Entered ByCarlene Coria on 02/25/2022 14:13:03 Signature(s): Date(s):

## 2022-03-02 ENCOUNTER — Emergency Department: Payer: Medicare Other

## 2022-03-02 ENCOUNTER — Other Ambulatory Visit: Payer: Self-pay

## 2022-03-02 DIAGNOSIS — Z7982 Long term (current) use of aspirin: Secondary | ICD-10-CM

## 2022-03-02 DIAGNOSIS — K219 Gastro-esophageal reflux disease without esophagitis: Secondary | ICD-10-CM | POA: Diagnosis present

## 2022-03-02 DIAGNOSIS — N179 Acute kidney failure, unspecified: Principal | ICD-10-CM | POA: Diagnosis present

## 2022-03-02 DIAGNOSIS — Z79811 Long term (current) use of aromatase inhibitors: Secondary | ICD-10-CM

## 2022-03-02 DIAGNOSIS — Z8673 Personal history of transient ischemic attack (TIA), and cerebral infarction without residual deficits: Secondary | ICD-10-CM

## 2022-03-02 DIAGNOSIS — E669 Obesity, unspecified: Secondary | ICD-10-CM | POA: Diagnosis present

## 2022-03-02 DIAGNOSIS — G629 Polyneuropathy, unspecified: Secondary | ICD-10-CM | POA: Diagnosis present

## 2022-03-02 DIAGNOSIS — Z87891 Personal history of nicotine dependence: Secondary | ICD-10-CM

## 2022-03-02 DIAGNOSIS — Z853 Personal history of malignant neoplasm of breast: Secondary | ICD-10-CM

## 2022-03-02 DIAGNOSIS — J449 Chronic obstructive pulmonary disease, unspecified: Secondary | ICD-10-CM | POA: Diagnosis present

## 2022-03-02 DIAGNOSIS — I130 Hypertensive heart and chronic kidney disease with heart failure and stage 1 through stage 4 chronic kidney disease, or unspecified chronic kidney disease: Secondary | ICD-10-CM | POA: Diagnosis present

## 2022-03-02 DIAGNOSIS — D631 Anemia in chronic kidney disease: Secondary | ICD-10-CM | POA: Diagnosis present

## 2022-03-02 DIAGNOSIS — G4733 Obstructive sleep apnea (adult) (pediatric): Secondary | ICD-10-CM | POA: Diagnosis present

## 2022-03-02 DIAGNOSIS — N1831 Chronic kidney disease, stage 3a: Secondary | ICD-10-CM | POA: Diagnosis present

## 2022-03-02 DIAGNOSIS — Z8249 Family history of ischemic heart disease and other diseases of the circulatory system: Secondary | ICD-10-CM

## 2022-03-02 DIAGNOSIS — Z79899 Other long term (current) drug therapy: Secondary | ICD-10-CM

## 2022-03-02 DIAGNOSIS — Z170 Estrogen receptor positive status [ER+]: Secondary | ICD-10-CM

## 2022-03-02 DIAGNOSIS — L97929 Non-pressure chronic ulcer of unspecified part of left lower leg with unspecified severity: Secondary | ICD-10-CM | POA: Diagnosis present

## 2022-03-02 DIAGNOSIS — I5042 Chronic combined systolic (congestive) and diastolic (congestive) heart failure: Secondary | ICD-10-CM | POA: Diagnosis present

## 2022-03-02 DIAGNOSIS — E860 Dehydration: Secondary | ICD-10-CM | POA: Diagnosis present

## 2022-03-02 DIAGNOSIS — Z9221 Personal history of antineoplastic chemotherapy: Secondary | ICD-10-CM

## 2022-03-02 DIAGNOSIS — L97919 Non-pressure chronic ulcer of unspecified part of right lower leg with unspecified severity: Secondary | ICD-10-CM | POA: Diagnosis present

## 2022-03-02 DIAGNOSIS — R55 Syncope and collapse: Secondary | ICD-10-CM | POA: Diagnosis not present

## 2022-03-02 DIAGNOSIS — K5900 Constipation, unspecified: Secondary | ICD-10-CM | POA: Diagnosis not present

## 2022-03-02 DIAGNOSIS — Z6834 Body mass index (BMI) 34.0-34.9, adult: Secondary | ICD-10-CM

## 2022-03-02 DIAGNOSIS — Z923 Personal history of irradiation: Secondary | ICD-10-CM

## 2022-03-02 LAB — CBC
HCT: 35.1 % — ABNORMAL LOW (ref 36.0–46.0)
Hemoglobin: 10.7 g/dL — ABNORMAL LOW (ref 12.0–15.0)
MCH: 26.6 pg (ref 26.0–34.0)
MCHC: 30.5 g/dL (ref 30.0–36.0)
MCV: 87.1 fL (ref 80.0–100.0)
Platelets: 327 10*3/uL (ref 150–400)
RBC: 4.03 MIL/uL (ref 3.87–5.11)
RDW: 14.3 % (ref 11.5–15.5)
WBC: 7.8 10*3/uL (ref 4.0–10.5)
nRBC: 0 % (ref 0.0–0.2)

## 2022-03-02 LAB — COMPREHENSIVE METABOLIC PANEL WITH GFR
ALT: 18 U/L (ref 0–44)
AST: 18 U/L (ref 15–41)
Albumin: 3.6 g/dL (ref 3.5–5.0)
Alkaline Phosphatase: 62 U/L (ref 38–126)
Anion gap: 6 (ref 5–15)
BUN: 35 mg/dL — ABNORMAL HIGH (ref 8–23)
CO2: 27 mmol/L (ref 22–32)
Calcium: 9.6 mg/dL (ref 8.9–10.3)
Chloride: 108 mmol/L (ref 98–111)
Creatinine, Ser: 2.07 mg/dL — ABNORMAL HIGH (ref 0.44–1.00)
GFR, Estimated: 24 mL/min — ABNORMAL LOW
Glucose, Bld: 117 mg/dL — ABNORMAL HIGH (ref 70–99)
Potassium: 5 mmol/L (ref 3.5–5.1)
Sodium: 141 mmol/L (ref 135–145)
Total Bilirubin: 0.5 mg/dL (ref 0.3–1.2)
Total Protein: 7.9 g/dL (ref 6.5–8.1)

## 2022-03-02 LAB — TROPONIN I (HIGH SENSITIVITY): Troponin I (High Sensitivity): 16 ng/L (ref <18)

## 2022-03-02 NOTE — ED Triage Notes (Signed)
Pt presents to ER from home c/o multiple episodes of LOC that started Thursday and stated also happened Friday, Saturday and today.  Pt states she has several episodes consecutively when they happen.  Pt states "my head feels like its swollen inside."  Pt is A&O x4 in NAD in triage.

## 2022-03-03 ENCOUNTER — Inpatient Hospital Stay
Admission: EM | Admit: 2022-03-03 | Discharge: 2022-03-07 | DRG: 683 | Disposition: A | Discharge status: Skilled Nursing Facility | Payer: Medicare Other | Attending: Internal Medicine | Admitting: Internal Medicine

## 2022-03-03 ENCOUNTER — Inpatient Hospital Stay: Payer: Medicare Other

## 2022-03-03 DIAGNOSIS — E669 Obesity, unspecified: Secondary | ICD-10-CM

## 2022-03-03 DIAGNOSIS — I10 Essential (primary) hypertension: Secondary | ICD-10-CM

## 2022-03-03 DIAGNOSIS — N179 Acute kidney failure, unspecified: Secondary | ICD-10-CM

## 2022-03-03 DIAGNOSIS — L97919 Non-pressure chronic ulcer of unspecified part of right lower leg with unspecified severity: Secondary | ICD-10-CM

## 2022-03-03 DIAGNOSIS — Z170 Estrogen receptor positive status [ER+]: Secondary | ICD-10-CM | POA: Diagnosis not present

## 2022-03-03 DIAGNOSIS — L97929 Non-pressure chronic ulcer of unspecified part of left lower leg with unspecified severity: Secondary | ICD-10-CM

## 2022-03-03 DIAGNOSIS — I5042 Chronic combined systolic (congestive) and diastolic (congestive) heart failure: Secondary | ICD-10-CM | POA: Diagnosis present

## 2022-03-03 DIAGNOSIS — R55 Syncope and collapse: Secondary | ICD-10-CM | POA: Diagnosis present

## 2022-03-03 DIAGNOSIS — N189 Chronic kidney disease, unspecified: Secondary | ICD-10-CM

## 2022-03-03 DIAGNOSIS — Z7982 Long term (current) use of aspirin: Secondary | ICD-10-CM | POA: Diagnosis not present

## 2022-03-03 DIAGNOSIS — R531 Weakness: Secondary | ICD-10-CM

## 2022-03-03 DIAGNOSIS — Z8673 Personal history of transient ischemic attack (TIA), and cerebral infarction without residual deficits: Secondary | ICD-10-CM | POA: Diagnosis not present

## 2022-03-03 DIAGNOSIS — N1831 Chronic kidney disease, stage 3a: Secondary | ICD-10-CM | POA: Diagnosis present

## 2022-03-03 DIAGNOSIS — E860 Dehydration: Secondary | ICD-10-CM | POA: Diagnosis present

## 2022-03-03 DIAGNOSIS — Z87891 Personal history of nicotine dependence: Secondary | ICD-10-CM | POA: Diagnosis not present

## 2022-03-03 DIAGNOSIS — I5022 Chronic systolic (congestive) heart failure: Secondary | ICD-10-CM | POA: Diagnosis not present

## 2022-03-03 DIAGNOSIS — G4733 Obstructive sleep apnea (adult) (pediatric): Secondary | ICD-10-CM | POA: Diagnosis present

## 2022-03-03 DIAGNOSIS — K5900 Constipation, unspecified: Secondary | ICD-10-CM | POA: Diagnosis not present

## 2022-03-03 DIAGNOSIS — Z79899 Other long term (current) drug therapy: Secondary | ICD-10-CM | POA: Diagnosis not present

## 2022-03-03 DIAGNOSIS — Z8249 Family history of ischemic heart disease and other diseases of the circulatory system: Secondary | ICD-10-CM | POA: Diagnosis not present

## 2022-03-03 DIAGNOSIS — G629 Polyneuropathy, unspecified: Secondary | ICD-10-CM | POA: Diagnosis present

## 2022-03-03 DIAGNOSIS — D638 Anemia in other chronic diseases classified elsewhere: Secondary | ICD-10-CM

## 2022-03-03 DIAGNOSIS — J449 Chronic obstructive pulmonary disease, unspecified: Secondary | ICD-10-CM | POA: Diagnosis present

## 2022-03-03 DIAGNOSIS — Z9221 Personal history of antineoplastic chemotherapy: Secondary | ICD-10-CM | POA: Diagnosis not present

## 2022-03-03 DIAGNOSIS — D631 Anemia in chronic kidney disease: Secondary | ICD-10-CM | POA: Diagnosis present

## 2022-03-03 DIAGNOSIS — K219 Gastro-esophageal reflux disease without esophagitis: Secondary | ICD-10-CM | POA: Diagnosis present

## 2022-03-03 DIAGNOSIS — D649 Anemia, unspecified: Secondary | ICD-10-CM

## 2022-03-03 DIAGNOSIS — Z923 Personal history of irradiation: Secondary | ICD-10-CM | POA: Diagnosis not present

## 2022-03-03 DIAGNOSIS — Z79811 Long term (current) use of aromatase inhibitors: Secondary | ICD-10-CM | POA: Diagnosis not present

## 2022-03-03 DIAGNOSIS — I130 Hypertensive heart and chronic kidney disease with heart failure and stage 1 through stage 4 chronic kidney disease, or unspecified chronic kidney disease: Secondary | ICD-10-CM | POA: Diagnosis present

## 2022-03-03 DIAGNOSIS — Z853 Personal history of malignant neoplasm of breast: Secondary | ICD-10-CM | POA: Diagnosis not present

## 2022-03-03 HISTORY — DX: Syncope and collapse: R55

## 2022-03-03 HISTORY — DX: Acute kidney failure, unspecified: N17.9

## 2022-03-03 HISTORY — DX: Weakness: R53.1

## 2022-03-03 LAB — CBC WITH DIFFERENTIAL/PLATELET
Abs Immature Granulocytes: 0.05 10*3/uL (ref 0.00–0.07)
Basophils Absolute: 0 10*3/uL (ref 0.0–0.1)
Basophils Relative: 1 %
Eosinophils Absolute: 0.2 10*3/uL (ref 0.0–0.5)
Eosinophils Relative: 3 %
HCT: 33.6 % — ABNORMAL LOW (ref 36.0–46.0)
Hemoglobin: 10.1 g/dL — ABNORMAL LOW (ref 12.0–15.0)
Immature Granulocytes: 1 %
Lymphocytes Relative: 29 %
Lymphs Abs: 2 10*3/uL (ref 0.7–4.0)
MCH: 26.6 pg (ref 26.0–34.0)
MCHC: 30.1 g/dL (ref 30.0–36.0)
MCV: 88.7 fL (ref 80.0–100.0)
Monocytes Absolute: 0.8 10*3/uL (ref 0.1–1.0)
Monocytes Relative: 11 %
Neutro Abs: 3.9 10*3/uL (ref 1.7–7.7)
Neutrophils Relative %: 55 %
Platelets: 271 10*3/uL (ref 150–400)
RBC: 3.79 MIL/uL — ABNORMAL LOW (ref 3.87–5.11)
RDW: 14.2 % (ref 11.5–15.5)
WBC: 7 10*3/uL (ref 4.0–10.5)
nRBC: 0 % (ref 0.0–0.2)

## 2022-03-03 LAB — URINALYSIS, ROUTINE W REFLEX MICROSCOPIC
Bacteria, UA: NONE SEEN
Bilirubin Urine: NEGATIVE
Glucose, UA: NEGATIVE mg/dL
Hgb urine dipstick: NEGATIVE
Ketones, ur: NEGATIVE mg/dL
Nitrite: NEGATIVE
Protein, ur: 30 mg/dL — AB
Specific Gravity, Urine: 1.023 (ref 1.005–1.030)
pH: 5 (ref 5.0–8.0)

## 2022-03-03 LAB — COMPREHENSIVE METABOLIC PANEL
ALT: 17 U/L (ref 0–44)
AST: 17 U/L (ref 15–41)
Albumin: 2.9 g/dL — ABNORMAL LOW (ref 3.5–5.0)
Alkaline Phosphatase: 55 U/L (ref 38–126)
Anion gap: 8 (ref 5–15)
BUN: 32 mg/dL — ABNORMAL HIGH (ref 8–23)
CO2: 24 mmol/L (ref 22–32)
Calcium: 8.7 mg/dL — ABNORMAL LOW (ref 8.9–10.3)
Chloride: 106 mmol/L (ref 98–111)
Creatinine, Ser: 1.9 mg/dL — ABNORMAL HIGH (ref 0.44–1.00)
GFR, Estimated: 27 mL/min — ABNORMAL LOW (ref >60)
Glucose, Bld: 96 mg/dL (ref 70–99)
Potassium: 4.9 mmol/L (ref 3.5–5.1)
Sodium: 138 mmol/L (ref 135–145)
Total Bilirubin: 0.5 mg/dL (ref 0.3–1.2)
Total Protein: 6.7 g/dL (ref 6.5–8.1)

## 2022-03-03 LAB — TROPONIN I (HIGH SENSITIVITY): Troponin I (High Sensitivity): 16 ng/L (ref <18)

## 2022-03-03 MED ORDER — ONDANSETRON HCL 4 MG/2ML IJ SOLN: 4.0000 mg | Freq: Four times a day (QID) | INTRAMUSCULAR | Status: DC | PRN

## 2022-03-03 MED ORDER — SODIUM CHLORIDE 0.9 % IV BOLUS
500.0000 mL | Freq: Once | INTRAVENOUS | Status: AC
  Administered 2022-03-03: 500 mL via INTRAVENOUS

## 2022-03-03 MED ORDER — SODIUM CHLORIDE 0.9 % IV BOLUS
500.0000 mL | Freq: Once | INTRAVENOUS | Status: AC
Start: 2022-03-03 — End: 2022-03-03
  Administered 2022-03-03: 500 mL via INTRAVENOUS

## 2022-03-03 MED ORDER — ASPIRIN 81 MG PO TBEC
81.0000 mg | DELAYED_RELEASE_TABLET | Freq: Every day | ORAL | Status: DC
  Administered 2022-03-03 – 2022-03-07 (×5): 81 mg via ORAL
  Filled 2022-03-03 (×5): qty 1

## 2022-03-03 MED ORDER — ALBUTEROL SULFATE (2.5 MG/3ML) 0.083% IN NEBU: 2.5000 mg | INHALATION_SOLUTION | RESPIRATORY_TRACT | Status: DC | PRN

## 2022-03-03 MED ORDER — LORATADINE 10 MG PO TABS
10.0000 mg | ORAL_TABLET | Freq: Every day | ORAL | Status: DC
  Administered 2022-03-03 – 2022-03-07 (×5): 10 mg via ORAL
  Filled 2022-03-03 (×5): qty 1

## 2022-03-03 MED ORDER — OYSTER SHELL CALCIUM/D3 500-5 MG-MCG PO TABS
1.0000 | ORAL_TABLET | Freq: Two times a day (BID) | ORAL | Status: DC
  Administered 2022-03-03 – 2022-03-07 (×9): 1 via ORAL
  Filled 2022-03-03 (×9): qty 1

## 2022-03-03 MED ORDER — ONDANSETRON HCL 4 MG PO TABS: 4.0000 mg | ORAL_TABLET | Freq: Four times a day (QID) | ORAL | Status: DC | PRN

## 2022-03-03 MED ORDER — HEPARIN SODIUM (PORCINE) 5000 UNIT/ML IJ SOLN
5000.0000 [IU] | Freq: Three times a day (TID) | INTRAMUSCULAR | Status: DC
Start: 2022-03-03 — End: 2022-03-07
  Administered 2022-03-03 – 2022-03-07 (×13): 5000 [IU] via SUBCUTANEOUS
  Filled 2022-03-03 (×13): qty 1

## 2022-03-03 MED ORDER — ACETAMINOPHEN 650 MG RE SUPP: 650.0000 mg | Freq: Four times a day (QID) | RECTAL | Status: DC | PRN

## 2022-03-03 MED ORDER — SODIUM CHLORIDE 0.9 % IV SOLN: INTRAVENOUS | Status: DC

## 2022-03-03 MED ORDER — ACETAMINOPHEN 325 MG PO TABS
650.0000 mg | ORAL_TABLET | Freq: Four times a day (QID) | ORAL | Status: DC | PRN
  Administered 2022-03-04 – 2022-03-06 (×5): 650 mg via ORAL
  Filled 2022-03-03 (×5): qty 2

## 2022-03-03 MED ORDER — PANTOPRAZOLE SODIUM 40 MG PO TBEC
40.0000 mg | DELAYED_RELEASE_TABLET | Freq: Every day | ORAL | Status: DC
  Administered 2022-03-03 – 2022-03-07 (×5): 40 mg via ORAL
  Filled 2022-03-03 (×5): qty 1

## 2022-03-03 NOTE — ED Provider Notes (Signed)
[facility] Provider Note    Event Date/Time   First MD Initiated Contact with Patient 03/03/22 0031     (approximate)   History   Loss of Consciousness   HPI  [name] is a 76 y.o. female who presents to the ED from home with a chief complaint of recurrent syncope.  Patient reports multiple syncopal episodes since 4 days ago; daily occurrence and occurred 3 times yesterday.  She is here tonight at the advice of her girlfriend who encouraged her to "get checked out".  Reports prior to syncopal episodes she experiences right-sided chest pain which radiates down her arm, then into her neck and head.  She denies any injury from the syncopal episodes.  Denies recent fever, cough, shortness of breath, abdominal pain, nausea, vomiting or dizziness.  Denies associated facial droop, slurred speech, extremity weakness/numbness or tingling.  Patient does have a medical history including CHF, hypertension, breast cancer on oral chemotherapy, OSA.  Denies anticoagulant use.     Past Medical History   Past Medical History:  Diagnosis Date   Abnormality of gait    Breast cancer (HCC) 02/05/2005   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    Breast cancer (HCC) 2019   right breast   CHF (congestive heart failure) (HCC)    March 28, 2017 echo: EF 50-55%; 1) echo 07/2011: EF 20-25%, mild concentric hypertrophy, diffuse HK, regional WMA cannot be excluded, grade 1 DD, mitral valve with mild regurg, LA mildly dilated). 2) EF 40-45%, mild AS,                     Complication of anesthesia 2006   pt states "hard to wake up" after L breast surgery    Edema    GERD (gastroesophageal reflux disease)    Headache    Heart murmur    Hypertension    Hypokinesis    global, EF 35-45 % from Echo.   Malignant neoplasm of breast (female), unspecified site 06/14/2018   RIGHT T1b, N0; ER/PR positive,  HER-2 negative.   Neuropathy    Obesity    Pain in joint, site unspecified    Personal history of chemotherapy 2006   left breast   Personal history of radiation therapy 2019   right breast   Personal history of radiation therapy 2006   left breast   Pneumonia    Sleep apnea    Tinea pedis    Unspecified hearing loss    bilateral     Active Problem List   Patient Active Problem List   Diagnosis Date Noted   Lower limb ulcer, calf, left, limited to breakdown of skin (HCC) 12/11/2020   PAD (peripheral artery disease) (HCC) 05/30/2020   Malignant neoplasm of right female breast (HCC) 05/13/2018   Venous insufficiency of both lower extremities 11/20/2017   Acute on chronic diastolic CHF (congestive heart failure) (HCC) 11/20/2017   Weight gain 11/20/2017   Lymphedema 04/19/2017   Benign essential HTN 04/19/2017   OSA (obstructive sleep apnea) 04/19/2017   Morbid obesity (HCC) 04/19/2017   TIA (transient ischemic attack) 03/28/2017   Urinary incontinence 06/27/2016   Chronic systolic CHF (congestive heart failure) (HCC) 04/20/2013   HTN (hypertension) 07/16/2011   CHF (congestive heart failure) (HCC) 07/15/2011   Shortness of breath 07/15/2011   Edema 07/15/2011   Breast cancer (HCC) 09/08/2004     Past Surgical History  Past Surgical History:  Procedure Laterality Date   BREAST BIOPSY Right 2019   11 oc-INVASIVE MAMMARY CARCINOMA, NO SPECIAL TYPE   BREAST BIOPSY Right 2019   1030 oc- neg   BREAST LUMPECTOMY  2006   left breast    CATARACT EXTRACTION     left eye   HERNIA REPAIR  05/25/2006   Incarcerated omentum in ventral hernia, Ventralight mesh, combined lap/ open with omental resection.    PARTIAL MASTECTOMY WITH NEEDLE LOCALIZATION Right 06/14/2018   Procedure: PARTIAL MASTECTOMY WITH NEEDLE LOCALIZATION;  Surgeon: Earline Mayotte, MD;  Location: [facility] ORS;  Service: General;  Laterality: Right;   PORT-A-CATH REMOVAL  05/25/2006   SENTINEL NODE BIOPSY  Right 06/14/2018   Procedure: SENTINEL NODE BIOPSY;  Surgeon: Earline Mayotte, MD;  Location: [facility] ORS;  Service: General;  Laterality: Right;   TUBAL LIGATION       Home Medications   Prior to Admission medications   Medication Sig Start Date End Date Taking? Authorizing Provider  [employer] CALCIUM + D3 600-20 MG-MCG TABS TAKE 1 TABLET BY MOUTH TWICE A DAY 02/04/22   Creig Hines, MD  anastrozole (ARIMIDEX) 1 MG tablet TAKE 1 TABLET BY MOUTH DAILY. PATIENT MUST MAKE AN APPT TO SEE DR RAO 12/23/21   Creig Hines, MD  aspirin EC 81 MG tablet Take 81 mg by mouth daily.    [provider]  cloNIDine (CATAPRES) 0.2 MG tablet Take 1 tablet (0.2 mg total) by mouth 2 (two) times daily. 08/05/21   Antonieta Iba, MD  clotrimazole (LOTRIMIN) 1 % cream Apply 1 application topically 2 (two) times daily.  02/18/18   [provider]  diclofenac Sodium (VOLTAREN) 1 % GEL Apply 1 application topically as needed. 04/27/20   [provider]  Incontinence Supply Disposable (BLADDER CONTROL PADS EX ABSORB) MISC Apply 1 Dose topically as needed.  03/22/18   [provider]  isosorbide mononitrate (IMDUR) 60 MG 24 hr tablet Take 1 tablet (60 mg total) by mouth 2 (two) times daily. 04/20/13 08/05/21  Antonieta Iba, MD  loratadine (CLARITIN) 10 MG tablet Take 10 mg by mouth daily. 07/20/19   [provider]  losartan (COZAAR) 25 MG tablet Take 1 tablet (25 mg total) by mouth daily. 08/05/21   Antonieta Iba, MD  methocarbamol (ROBAXIN) 500 MG tablet Take 500 mg by mouth 2 (two) times daily as needed. 05/02/20   [provider]  MIRALAX 17 GM/SCOOP powder SMARTSIG:2 Scoopful By Mouth Daily PRN 04/10/21   [provider]  omeprazole (PRILOSEC) 40 MG capsule Take 40 mg by mouth daily. 04/13/21   [provider]  oxybutynin (DITROPAN) 5 MG tablet Take 5 mg by mouth 2 (two) times daily.    [provider]  potassium chloride SA (KLOR-CON) 20  MEQ tablet Take 1 tablet (20 mEq total) by mouth 2 (two) times daily. 08/05/21   Antonieta Iba, MD  Torsemide 40 MG TABS Take 40 mg by mouth 2 (two) times daily. 08/05/21   Antonieta Iba, MD  triamcinolone cream (KENALOG) 0.1 % Apply 1 application topically 2 (two) times daily. Back of legs 03/21/19   [provider]     Allergies  Patient has no known allergies.   Family History   Family History  Problem Relation Age of Onset   Hypertension Mother        deceased 7   Cataracts Mother    Breast cancer Other 26  maternal half-sister; deceased 59   Coronary artery disease Other      Physical Exam  Triage Vital Signs: ED Triage Vitals  Enc Vitals Group     BP 03/02/22 2208 123/70     Pulse Rate 03/02/22 2208 70     Resp 03/02/22 2208 18     Temp 03/02/22 2208 99.2 F (37.3 C)     Temp Source 03/02/22 2208 Oral     SpO2 03/02/22 2208 94 %     Weight 03/02/22 2208 232 lb (105.2 kg)     Height 03/02/22 2208 5\' 9"  (1.753 m)     Head Circumference --      Peak Flow --      Pain Score 03/02/22 2217 8     Pain Loc --      Pain Edu? --      Excl. in GC? --     Updated Vital Signs: BP 139/68   Pulse 72   Temp 99.2 F (37.3 C) (Oral)   Resp 17   Ht 5\' 9"  (1.753 m)   Wt 105.2 kg   SpO2 100%   BMI 34.26 kg/m    General: Awake, no distress.  CV:  RRR.  Good peripheral perfusion.  Resp:  Normal effort.  CTA B. Abd:  Nontender.  No distention.  Other:  No carotid bruits.  Supple neck without meningismus.  Alert and oriented x3.  CN II toXII grossly intact./5 motor strength and sensation all extremities. MAEx4. BLE wrapped due to lymphedema and ulcers.   ED Results / Procedures / Treatments  Labs (all labs ordered are listed, but only abnormal results are displayed) Labs Reviewed  CBC - Abnormal; Notable for the following components:      Result Value   Hemoglobin 10.7 (*)    HCT 35.1 (*)    All other components within normal limits   COMPREHENSIVE METABOLIC PANEL - Abnormal; Notable for the following components:   Glucose, Bld 117 (*)    BUN 35 (*)    Creatinine, Ser 2.07 (*)    GFR, Estimated 24 (*)    All other components within normal limits  URINALYSIS, ROUTINE W REFLEX MICROSCOPIC  TROPONIN I (HIGH SENSITIVITY)  TROPONIN I (HIGH SENSITIVITY)     EKG  ED ECG REPORT I, Laural Eiland J, the attending physician, personally viewed and interpreted this ECG.   Date: 03/03/2022  EKG Time: 2212  Rate: 84  Rhythm: normal sinus rhythm  Axis: LAD  Intervals: PVCs  ST&T Change: Nonspecific    RADIOLOGY I have independently visualized and interpreted patient's CT head as well as noted the radiology interpretation:  CT head: No ICH  Official radiology report(s): CT HEAD WO CONTRAST ( )  Result Date: 03/02/2022 CLINICAL DATA:  Headache, new or worsening (Age >= 50y) EXAM: CT HEAD WITHOUT CONTRAST TECHNIQUE: Contiguous axial images were obtained from the base of the skull through the vertex without intravenous contrast. RADIATION DOSE REDUCTION: This exam was performed according to the departmental dose-optimization program which includes automated exposure control, adjustment of the mA and/or kV according to patient size and/or use of iterative reconstruction technique. COMPARISON:  None Available. FINDINGS: Brain: Normal anatomic configuration. Parenchymal volume loss is commensurate with the patient's age. Mild periventricular white matter changes are present likely reflecting the sequela of small vessel ischemia. No abnormal intra or extra-axial mass lesion or fluid collection. No abnormal mass effect or midline shift. No evidence of acute intracranial hemorrhage or infarct. Ventricular size is normal.  Cerebellum unremarkable. Vascular: No asymmetric hyperdense vasculature at the skull base. Skull: Intact Sinuses/Orbits: Paranasal sinuses are clear. Orbits are unremarkable. Other: Mastoid air cells and middle ear  cavities are clear. IMPRESSION: No acute intracranial abnormality. Mild senescent change. Electronically Signed   By: Helyn Numbers M.D.   On: 03/02/2022 22:56     PROCEDURES:  Critical Care performed: No  .1-3 Lead EKG Interpretation  Performed by: Irean Hong, MD Authorized by: Irean Hong, MD     Interpretation: normal     ECG rate:  85   ECG rate assessment: normal     Rhythm: sinus rhythm     Ectopy: none     Conduction: normal   Comments:     Patient placed on cardiac monitor to evaluate for arrhythmias    MEDICATIONS ORDERED IN ED: Medications  sodium chloride 0.9 % bolus 500 mL (500 mLs Intravenous New Bag/Given 03/03/22 0209)     IMPRESSION / MDM / ASSESSMENT AND PLAN / ED COURSE  I reviewed the triage vital signs and the nursing notes.                             76 year old female presenting with recurrent syncope.  Differential diagnosis includes but is not limited to TIA/CVA, ACS, metabolic, infectious etiology, etc.  I have personally reviewed patient's records and see that she had a wound clinic office visit on 02/25/2022.  Patient's presentation is most consistent with acute presentation with potential threat to life or bodily function.  The patient is on the cardiac monitor to evaluate for evidence of arrhythmia and/or significant heart rate changes.  Laboratory results demonstrate normal WBC 7.8, AKI creatinine 2.07 which has doubled since last checked 6 months ago, 2 sets of unremarkable troponins.  Patient is on pure wick with no urine production yet.  Will administer gentle IV hydration.  Will consult hospitalist services for evaluation and admission for recurrent syncope and chest pain.  Clinical Course as of 03/03/22 0254  Mon Mar 03, 2022  0253 Case was discussed with hospitalist services for evaluation and admission. [JS]    Clinical Course User Index [JS] Irean Hong, MD     FINAL CLINICAL IMPRESSION(S) / ED DIAGNOSES   Final diagnoses:   Syncope, unspecified syncope type  AKI (acute kidney injury) (HCC)     Rx / DC Orders   ED Discharge Orders     None        Note:  This document was prepared using Dragon voice recognition software and may include unintentional dictation errors.   Irean Hong, MD 03/03/22 [phone]

## 2022-03-03 NOTE — Assessment & Plan Note (Addendum)
Patient had a 10 systolic drop in blood pressure with sitting up but unable to check standing blood pressures secondary to patient being very weak.  Holding antihypertensive medications currently.

## 2022-03-03 NOTE — Assessment & Plan Note (Addendum)
BP much improved Cont current dose of meds as prescribed -monitor BP at home and review with PCP

## 2022-03-03 NOTE — ED Notes (Signed)
Pt unable to bear weight, even with assistance. Unable to complete orthostatic VS due to this. MD made aware.

## 2022-03-03 NOTE — Evaluation (Signed)
Occupational Therapy Evaluation Patient Details Name: [name] MRN: [id number] DOB: 16-Sep-1945 Today's Date: 03/03/2022   History of Present Illness 76 y.o. female with medical history significant of    CHFpEF, hypertension, breast cancer on oral chemotherapy, COPD,OSA not on cpap,GERD who presents to ED with recurrent near syncope with fall  x 1 week and now becoming more frequent. Patient states she had 3 episodes of blacking out and it is usually followed by a frontal HA.  Imaging negative for acute infarct.   Clinical Impression   Pt was seen for OT evaluation this date. Prior to hospital admission, pt reports she was independent with ADL, ambulating with a rollator, granddaughter provides meals, cleans, and gets groceries. Pt does not drive but uses bus to get to/from appointments. Pt lives with her daughter and granddaughter in a handicap accessible apartment, per pt report. No family present to verify home set up/PLOF. Pt endorses 6+ falls very recently due to blacking out. Currently pt demonstrates impairments as described below (See OT problem list) which functionally limit her ability to perform ADL/self-care tasks. Pt currently requires supervision for Sup>sit EOB + time/effort. MAX A to stand with heavy reliance on the RW and demonstrated significant difficulty with shuffling BLE laterally along side the bed, and MAX A for LB ADL tasks. Pt reports she typically doesn't have to wear socks, she gets her legs wrapped at a wound clinic. Anticipate +2 assist required for future standing/mobility attempts. Partial orthostatics obtained. Pt unable to tolerate standing long enough for standing BP reading. Denied symptoms, just fatigued and weak. Pt would benefit from skilled OT services to address noted impairments and functional limitations (see below for any additional details) in order to maximize safety and independence while minimizing falls risk and caregiver burden. Upon hospital  discharge, recommend STR to maximize pt safety and return to PLOF.    Recommendations for follow up therapy are one component of a multi-disciplinary discharge planning process, led by the attending physician.  Recommendations may be updated based on patient status, additional functional criteria and insurance authorization.   Follow Up Recommendations  Skilled nursing-short term rehab (<3 hours/day)    Assistance Recommended at Discharge Frequent or constant Supervision/Assistance  Patient can return home with the following Two people to help with walking and/or transfers;A lot of help with bathing/dressing/bathroom;Assistance with cooking/housework;Assist for transportation;Help with stairs or ramp for entrance    Functional Status Assessment  Patient has had a recent decline in their functional status and demonstrates the ability to make significant improvements in function in a reasonable and predictable amount of time.  Equipment Recommendations  BSC/3in1;Other (comment) (2WW)    Recommendations for Other Services       Precautions / Restrictions Precautions Precautions: Fall Restrictions Weight Bearing Restrictions: No      Mobility Bed Mobility Overal bed mobility: Needs Assistance Bed Mobility: Supine to Sit, Sit to Supine     Supine to sit: Supervision, HOB elevated Sit to supine: Mod assist   General bed mobility comments: increased time/effort, required MOD A for BLE mgt back to bed    Transfers Overall transfer level: Needs assistance Equipment used: Rolling walker (2 wheels) Transfers: Sit to/from Stand Sit to Stand: Max assist, From elevated surface           General transfer comment: heavy MAX A heavily reliant on RW, VC for posture to improve balance, tendency to keep knees and hips flexed      Balance Overall balance assessment: Needs assistance Sitting-balance  support: Single extremity supported, Feet supported, Feet unsupported Sitting  balance-Leahy Scale: Fair     Standing balance support: Bilateral upper extremity supported, Reliant on assistive device for balance Standing balance-Leahy Scale: Poor                             ADL either performed or assessed with clinical judgement   ADL Overall ADL's : Needs assistance/impaired                                       General ADL Comments: Pt requires MAX A for LB ADL tasks, MAX A for ADL transfers with RW, MIN A for seated UB ADL.     Vision         Perception     Praxis      Pertinent Vitals/Pain Pain Assessment Pain Assessment: No/denies pain     Hand Dominance     Extremity/Trunk Assessment Upper Extremity Assessment Upper Extremity Assessment: Generalized weakness   Lower Extremity Assessment Lower Extremity Assessment: Generalized weakness (BLE wrapped, pt reports very itchy)   Cervical / Trunk Assessment Cervical / Trunk Assessment: Kyphotic   Communication Communication Communication: No difficulties   Cognition Arousal/Alertness: Awake/alert Behavior During Therapy: WFL for tasks assessed/performed Overall Cognitive Status: Within Functional Limits for tasks assessed                                       General Comments  Partial orthostatics taken, see vitals section for details; pt denied symptoms    Exercises Other Exercises Other Exercises: Pt instructed in ADL transfers with RW, VC for hand placement, upright posture to improve balance/safety   Shoulder Instructions      Home Living Family/patient expects to be discharged to:: Private residence Living Arrangements: Children (daughter and granddaughter) Available Help at Discharge: Family;Available 24 hours/day;Available PRN/intermittently (granddaughter assists with caregiving for daughter) Type of Home: Apartment Home Access: Level entry (handicap accessible)     Home Layout: One level     Bathroom Shower/Tub: Multimedia programmer: Standard     Home Equipment: Rollator (4 wheels)          Prior Functioning/Environment Prior Level of Function : History of Falls (last six months);Independent/Modified Independent             Mobility Comments: Pt ambulates with rollator, endorses 6 recent falls due to passing out ADLs Comments: Pt reports indep with basic ADL, granddaughter assists with IADL including meal prep, cooking, and groceries. Pt uses bus for appointments, does not drive.        OT Problem List: Decreased strength;Decreased activity tolerance;Obesity;Decreased knowledge of use of DME or AE;Impaired balance (sitting and/or standing)      OT Treatment/Interventions: Self-care/ADL training;Therapeutic exercise;Therapeutic activities;DME and/or AE instruction;Energy conservation;Patient/family education;Balance training    OT Goals(Current goals can be found in the care plan section) Acute Rehab OT Goals Patient Stated Goal: get better and go home OT Goal Formulation: With patient Time For Goal Achievement: 03/17/22 Potential to Achieve Goals: Good ADL Goals Pt Will Perform Lower Body Dressing: with mod assist;sit to/from stand;with adaptive equipment Pt Will Transfer to Toilet: with mod assist;stand pivot transfer;bedside commode (LRAD) Pt Will Perform Toileting - Clothing Manipulation and hygiene: with min guard  assist;with set-up;sitting/lateral leans Additional ADL Goal #1: Pt will verbalize plan to implement at least 2 learned falls prevention strategies to maximize safety with ADL/mobility.  OT Frequency: Min 2X/week    Co-evaluation              AM-PAC OT "6 Clicks" Daily Activity     Outcome Measure Help from another person eating meals?: None Help from another person taking care of personal grooming?: A Little Help from another person toileting, which includes using toliet, bedpan, or urinal?: Total Help from another person bathing (including washing,  rinsing, drying)?: A Lot Help from another person to put on and taking off regular upper body clothing?: A Little Help from another person to put on and taking off regular lower body clothing?: A Lot 6 Click Score: 15   End of Session Equipment Utilized During Treatment: Gait belt;Rolling walker (2 wheels) Nurse Communication: Mobility status  Activity Tolerance: Patient tolerated treatment well Patient left: in bed;with call bell/phone within reach;with bed alarm set  OT Visit Diagnosis: Other abnormalities of gait and mobility (R26.89);Repeated falls (R29.6);Muscle weakness (generalized) (M62.81)                Time: 1610-9604 OT Time Calculation (min): 22 min Charges:  OT General Charges $OT Visit: 1 Visit OT Evaluation $OT Eval Moderate Complexity: 1 Mod OT Treatments $Self Care/Home Management : 8-22 mins  Arman Filter., MPH, MS, OTR/L ascom [phone] 03/03/22, 3:19 PM

## 2022-03-04 ENCOUNTER — Encounter: Payer: Medicare Other | Admitting: Physician Assistant

## 2022-03-04 ENCOUNTER — Inpatient Hospital Stay (HOSPITAL_COMMUNITY)
Admit: 2022-03-04 | Discharge: 2022-03-04 | Disposition: A | Discharge status: Home / Self Care | Payer: Medicare Other | Attending: Internal Medicine | Admitting: Internal Medicine

## 2022-03-04 DIAGNOSIS — R55 Syncope and collapse: Secondary | ICD-10-CM | POA: Diagnosis not present

## 2022-03-04 DIAGNOSIS — D638 Anemia in other chronic diseases classified elsewhere: Secondary | ICD-10-CM | POA: Diagnosis not present

## 2022-03-04 DIAGNOSIS — N179 Acute kidney failure, unspecified: Secondary | ICD-10-CM | POA: Diagnosis not present

## 2022-03-04 DIAGNOSIS — I5022 Chronic systolic (congestive) heart failure: Secondary | ICD-10-CM | POA: Diagnosis not present

## 2022-03-04 LAB — ECHOCARDIOGRAM COMPLETE
AR max vel: 2.19 cm2
AV Area VTI: 2.54 cm2
AV Area mean vel: 2.37 cm2
AV Mean grad: 5.3 mmHg
AV Peak grad: 11.1 mmHg
Ao pk vel: 1.66 m/s
Area-P 1/2: 3.07 cm2
Calc EF: 54.1 %
P 1/2 time: 611 msec
S' Lateral: 4.36 cm
Single Plane A2C EF: 58.6 %
Single Plane A4C EF: 49.4 %
Weight: 4028.25 oz

## 2022-03-04 LAB — BASIC METABOLIC PANEL
Anion gap: 3 — ABNORMAL LOW (ref 5–15)
BUN: 26 mg/dL — ABNORMAL HIGH (ref 8–23)
CO2: 27 mmol/L (ref 22–32)
Calcium: 8.7 mg/dL — ABNORMAL LOW (ref 8.9–10.3)
Chloride: 109 mmol/L (ref 98–111)
Creatinine, Ser: 1.33 mg/dL — ABNORMAL HIGH (ref 0.44–1.00)
GFR, Estimated: 41 mL/min — ABNORMAL LOW (ref >60)
Glucose, Bld: 85 mg/dL (ref 70–99)
Potassium: 4.4 mmol/L (ref 3.5–5.1)
Sodium: 139 mmol/L (ref 135–145)

## 2022-03-04 LAB — FERRITIN: Ferritin: 131 ng/mL (ref 11–307)

## 2022-03-04 LAB — HEMOGLOBIN: Hemoglobin: 9.9 g/dL — ABNORMAL LOW (ref 12.0–15.0)

## 2022-03-04 MED ORDER — POLYETHYLENE GLYCOL 3350 17 G PO PACK
17.0000 g | PACK | Freq: Two times a day (BID) | ORAL | Status: DC
  Administered 2022-03-04 – 2022-03-06 (×5): 17 g via ORAL
  Filled 2022-03-04 (×7): qty 1

## 2022-03-04 MED ORDER — ANASTROZOLE 1 MG PO TABS
1.0000 mg | ORAL_TABLET | Freq: Every day | ORAL | Status: DC
Start: 2022-03-04 — End: 2022-03-07
  Administered 2022-03-04 – 2022-03-07 (×4): 1 mg via ORAL
  Filled 2022-03-04 (×4): qty 1

## 2022-03-04 MED ORDER — POLYETHYLENE GLYCOL 3350 17 G PO PACK
17.0000 g | PACK | Freq: Every day | ORAL | Status: DC
Start: 2022-03-04 — End: 2022-03-04

## 2022-03-04 NOTE — [place] FL2 (Signed)
[place] MEDICAID FL2 LEVEL OF CARE SCREENING TOOL     IDENTIFICATION  Patient Name: [name] Birthdate: 25-May-1946 Sex: female Admission Date (Current Location): 03/03/2022  [facility] and [place] Number:  Chiropodist and Address:  [facility], [address], [place], [place] [address]      Provider Number: [id number]  Attending Physician Name and Address:  Alford Highland, MD  Relative Name and Phone Number:  Liliana Cline (daughter) [phone]    Current Level of Care: Hospital Recommended Level of Care: Skilled Nursing Facility Prior Approval Number:    Date Approved/Denied:   PASRR Number: [id number]  Discharge Plan: SNF    Current Diagnoses: Patient Active Problem List   Diagnosis Date Noted   Syncope 03/03/2022   Acute kidney injury superimposed on CKD (HCC) 03/03/2022   Weakness 03/03/2022   Ulcers of both lower extremities, with unspecified severity (HCC) 03/03/2022   Obesity (BMI 30-39.9) 03/03/2022   Anemia, unspecified 03/03/2022   Lower limb ulcer, calf, left, limited to breakdown of skin (HCC) 12/11/2020   PAD (peripheral artery disease) (HCC) 05/30/2020   Malignant neoplasm of right female breast (HCC) 05/13/2018   Venous insufficiency of both lower extremities 11/20/2017   Acute on chronic diastolic CHF (congestive heart failure) (HCC) 11/20/2017   Weight gain 11/20/2017   Lymphedema 04/19/2017   Benign essential HTN 04/19/2017   OSA (obstructive sleep apnea) 04/19/2017   Morbid obesity (HCC) 04/19/2017   TIA (transient ischemic attack) 03/28/2017   Urinary incontinence 06/27/2016   Chronic systolic CHF (congestive heart failure) (HCC) 04/20/2013   Essential hypertension 07/16/2011   CHF (congestive heart failure) (HCC) 07/15/2011   Shortness of breath 07/15/2011   Edema 07/15/2011   Breast cancer (HCC) 09/08/2004    Orientation RESPIRATION BLADDER Height & Weight     Self, Place  Normal Incontinent,  External catheter Weight: 251 lb 12.3 oz (114.2 kg) Height:  5\' 9"  (175.3 cm)  BEHAVIORAL SYMPTOMS/MOOD NEUROLOGICAL BOWEL NUTRITION STATUS      Continent Diet (see discharge summary)  AMBULATORY STATUS COMMUNICATION OF NEEDS Skin   Extensive Assist Verbally Normal                       Personal Care Assistance Level of Assistance  Bathing, Feeding, Dressing, Total care Bathing Assistance: Limited assistance Feeding assistance: Independent Dressing Assistance: Limited assistance Total Care Assistance: Maximum assistance   Functional Limitations Info  Sight, Hearing, Speech Sight Info: Adequate Hearing Info: Adequate Speech Info: Adequate    SPECIAL CARE FACTORS FREQUENCY  PT (By licensed PT), OT (By licensed OT)     PT Frequency: min 4x weekly OT Frequency: min 4x weekly            Contractures Contractures Info: Not present    Additional Factors Info  Code Status, Allergies Code Status Info: full Allergies Info: no known allergies           Current Medications (03/04/2022):  This is the current hospital active medication list Current Facility-Administered Medications  Medication Dose Route Frequency Provider Last Rate Last Admin   acetaminophen (TYLENOL) tablet 650 mg  650 mg Oral Q6H PRN Lurline Del, MD   650 mg at 03/04/22 6387   Or   acetaminophen (TYLENOL) suppository 650 mg  650 mg Rectal Q6H PRN Lurline Del, MD       albuterol (PROVENTIL) (2.5 MG/3ML) 0.083% nebulizer solution 2.5 mg  2.5 mg Nebulization Q2H PRN Lurline Del, MD  aspirin EC tablet 81 mg  81 mg Oral Daily Alford Highland, MD   81 mg at 03/03/22 1035   calcium-vitamin D (OSCAL WITH D) 500-5 MG-MCG per tablet 1 tablet  1 tablet Oral BID Alford Highland, MD   1 tablet at 03/03/22 2121   heparin injection 5,000 Units  5,000 Units Subcutaneous Q8H Skip Mayer A, MD   5,000 Units at 03/04/22 0527   loratadine (CLARITIN) tablet 10 mg  10 mg Oral Daily  Alford Highland, MD   10 mg at 03/03/22 1035   ondansetron (ZOFRAN) tablet 4 mg  4 mg Oral Q6H PRN Lurline Del, MD       Or   ondansetron [facility]) injection 4 mg  4 mg Intravenous Q6H PRN Lurline Del, MD       pantoprazole (PROTONIX) EC tablet 40 mg  40 mg Oral Daily Alford Highland, MD   40 mg at 03/03/22 1035     Discharge Medications: Please see discharge summary for a list of discharge medications.  Relevant Imaging Results:  Relevant Lab Results:   Additional Information SSN:[id number]  Gildardo Griffes, LCSW

## 2022-03-04 NOTE — Progress Notes (Signed)
  Echocardiogram 2D Echocardiogram has been performed.  Augustine Radar 03/04/2022, 1:41 PM

## 2022-03-05 DIAGNOSIS — N179 Acute kidney failure, unspecified: Secondary | ICD-10-CM | POA: Diagnosis not present

## 2022-03-05 DIAGNOSIS — N189 Chronic kidney disease, unspecified: Secondary | ICD-10-CM | POA: Diagnosis not present

## 2022-03-05 LAB — CBC
HCT: 32.8 % — ABNORMAL LOW (ref 36.0–46.0)
Hemoglobin: 10.3 g/dL — ABNORMAL LOW (ref 12.0–15.0)
MCH: 27.1 pg (ref 26.0–34.0)
MCHC: 31.4 g/dL (ref 30.0–36.0)
MCV: 86.3 fL (ref 80.0–100.0)
Platelets: 288 10*3/uL (ref 150–400)
RBC: 3.8 MIL/uL — ABNORMAL LOW (ref 3.87–5.11)
RDW: 13.6 % (ref 11.5–15.5)
WBC: 6.7 10*3/uL (ref 4.0–10.5)
nRBC: 0 % (ref 0.0–0.2)

## 2022-03-05 LAB — BASIC METABOLIC PANEL
Anion gap: 4 — ABNORMAL LOW (ref 5–15)
BUN: 22 mg/dL (ref 8–23)
CO2: 27 mmol/L (ref 22–32)
Calcium: 8.7 mg/dL — ABNORMAL LOW (ref 8.9–10.3)
Chloride: 107 mmol/L (ref 98–111)
Creatinine, Ser: 1.21 mg/dL — ABNORMAL HIGH (ref 0.44–1.00)
GFR, Estimated: 46 mL/min — ABNORMAL LOW (ref >60)
Glucose, Bld: 89 mg/dL (ref 70–99)
Potassium: 4.4 mmol/L (ref 3.5–5.1)
Sodium: 138 mmol/L (ref 135–145)

## 2022-03-05 MED ORDER — LOSARTAN POTASSIUM 25 MG PO TABS: 25.0000 mg | ORAL_TABLET | Freq: Every day | ORAL | Status: DC

## 2022-03-05 MED ORDER — BISACODYL 10 MG RE SUPP
10.0000 mg | Freq: Every day | RECTAL | Status: DC
  Administered 2022-03-05 – 2022-03-06 (×2): 10 mg via RECTAL
  Filled 2022-03-05 (×2): qty 1

## 2022-03-05 MED ORDER — CLONIDINE HCL 0.1 MG PO TABS
0.1000 mg | ORAL_TABLET | Freq: Two times a day (BID) | ORAL | Status: DC
  Administered 2022-03-05 – 2022-03-07 (×5): 0.1 mg via ORAL
  Filled 2022-03-05 (×5): qty 1

## 2022-03-05 MED ORDER — DOCUSATE SODIUM 100 MG PO CAPS: 100.0000 mg | ORAL_CAPSULE | Freq: Two times a day (BID) | ORAL | Status: DC | PRN

## 2022-03-05 MED ORDER — TORSEMIDE 20 MG PO TABS
20.0000 mg | ORAL_TABLET | Freq: Two times a day (BID) | ORAL | Status: DC
  Administered 2022-03-05 – 2022-03-07 (×5): 20 mg via ORAL
  Filled 2022-03-05 (×5): qty 1

## 2022-03-05 MED ORDER — OXYBUTYNIN CHLORIDE 5 MG PO TABS
5.0000 mg | ORAL_TABLET | Freq: Two times a day (BID) | ORAL | Status: DC
  Administered 2022-03-05 – 2022-03-07 (×5): 5 mg via ORAL
  Filled 2022-03-05 (×5): qty 1

## 2022-03-05 NOTE — Plan of Care (Signed)

## 2022-03-05 NOTE — Progress Notes (Signed)
Physical Therapy Treatment Patient Details Name: [name] MRN: [id number] DOB: 04/19/1946 Today's Date: 03/05/2022   History of Present Illness Pt is a 76 y/o F admitted on 03/03/22 after presenting with c/o recurrent near syncope & fall x 1 week but now becoming more frequent. PMH: CHFpEF, HTN, breast CA on oral chemotherapy, COPD, OSA not on CPAP, GERD    PT Comments    Pt ostensibly eager to work with PT but needed a lot of encouragement and reinforcement t/o the session.  Multiple sit to stand efforts with heavy assist needed when only 1 hand was able to push from elevated rail, she did manage to get to standing w/o heavy assist from recliner but needed extra time, alteration of strategy (due to lack of knee extension) and heavy b/l UE use.  Pt adamant that she is not going to rehab, though given her limitations this is still the PT recommendation.    Recommendations for follow up therapy are one component of a multi-disciplinary discharge planning process, led by the attending physician.  Recommendations may be updated based on patient status, additional functional criteria and insurance authorization.  Follow Up Recommendations  Skilled nursing-short term rehab (<3 hours/day) Can patient physically be transported by private vehicle: No   Assistance Recommended at Discharge Frequent or constant Supervision/Assistance  Patient can return home with the following A lot of help with bathing/dressing/bathroom;A lot of help with walking and/or transfers;Assist for transportation;Assistance with cooking/housework   Equipment Recommendations  Hospital bed;BSC/3in1    Recommendations for Other Services       Precautions / Restrictions Precautions Precautions: Fall Restrictions Weight Bearing Restrictions: No     Mobility  Bed Mobility Overal bed mobility: Needs Assistance Bed Mobility: Supine to Sit     Supine to sit: Min assist, Mod assist     General bed mobility  comments: Pt struggled with getting LEs off EOB and squaring up to the floor.  Heavy cuing and direct assist needed    Transfers Overall transfer level: Needs assistance Equipment used: Rolling walker (2 wheels) Transfers: Sit to/from Stand Sit to Stand: Mod assist, From elevated surface           General transfer comment: Pt needed heavy assist to rise X 2 from bed, b/l knees lack TKE, heavy UE reliance.  From recliner pt was able to rise with only min assist with heavy b/l UE use and extra time and effort to get COG over BOS    Ambulation/Gait Ambulation/Gait assistance: Min assist Gait Distance (Feet): 10 Feet Assistive device: Rolling walker (2 wheels)         General Gait Details: Pt with very slow and cautious ambulation, highly reliant on the walker, lacks TKE b/l but no overt buckling during the effort.  Pt showed limited tolerance and does acknowledge that she is weak and will need plenty of assist/supervision at home during ambulation.   Stairs             Wheelchair Mobility    Modified Rankin (Stroke Patients Only)       Balance Overall balance assessment: Needs assistance Sitting-balance support: Feet supported, Bilateral upper extremity supported Sitting balance-Leahy Scale: Fair     Standing balance support: Bilateral upper extremity supported, Reliant on assistive device for balance Standing balance-Leahy Scale: Poor Standing balance comment: lacks TKE and good LE strength, highly UE reliant  Cognition Arousal/Alertness: Awake/alert Behavior During Therapy: WFL for tasks assessed/performed Overall Cognitive Status: Within Functional Limits for tasks assessed                                          Exercises      General Comments General comments (skin integrity, edema, etc.): seated BP 180/94, standing 162/119      Pertinent Vitals/Pain Pain Assessment Pain Assessment:  Faces Faces Pain Scale: Hurts a little bit Pain Location: generalized    Home Living                          Prior Function            PT Goals (current goals can now be found in the care plan section) Progress towards PT goals: Progressing toward goals    Frequency    Min 2X/week      PT Plan Current plan remains appropriate    Co-evaluation              AM-PAC PT "6 Clicks" Mobility   Outcome Measure  Help needed turning from your back to your side while in a flat bed without using bedrails?: A Lot Help needed moving from lying on your back to sitting on the side of a flat bed without using bedrails?: A Lot Help needed moving to and from a bed to a chair (including a wheelchair)?: A Lot Help needed standing up from a chair using your arms (e.g., wheelchair or bedside chair)?: A Lot Help needed to walk in hospital room?: Total Help needed climbing 3-5 steps with a railing? : Total 6 Click Score: 10    End of Session Equipment Utilized During Treatment: Gait belt Activity Tolerance: Patient limited by fatigue Patient left: with chair alarm set Nurse Communication: Mobility status PT Visit Diagnosis: Difficulty in walking, not elsewhere classified (R26.2);Muscle weakness (generalized) (M62.81);History of falling (Z91.81)     Time: 1751-0258 PT Time Calculation (min) (ACUTE ONLY): 45 min  Charges:  $Gait Training: 8-22 mins $Therapeutic Activity: 23-37 mins                     Kreg Shropshire, DPT 03/05/2022, 11:23 AM

## 2022-03-05 NOTE — Progress Notes (Signed)
Occupational Therapy Treatment Patient Details Name: [name] MRN: [id number] DOB: Feb 24, 1946 Today's Date: 03/05/2022   History of present illness Pt is a 76 y/o F admitted on 03/03/22 after presenting with c/o recurrent near syncope & fall x 1 week but now becoming more frequent. PMH: CHFpEF, HTN, breast CA on oral chemotherapy, COPD, OSA not on CPAP, GERD   OT comments  Chart reviewed pt greeted in bedside chair requesting transfer to [facility]. Tx session targeted progressing functional mobility in order to facilitate safe ADL completion. Pt performed STS with MIN A with RW, short amb transfer to bedside chair with MIN A with RW. LB dressing completed with MAX A to donn, supervision to doff socks. Grooming tasks completed with SET UP in sitting. Pt left on BSC at her request, RN and NT aware of pt status. No /co dizziness throughout. Pt reports she is going to discharge home with help of family, has rollator and mwc to use if needed. Educated on DME use/home safety with current level of functioning as OT continues to recommend discharge to [place]. OT will continue to follow acutely.    Recommendations for follow up therapy are one component of a multi-disciplinary discharge planning process, led by the attending physician.  Recommendations may be updated based on patient status, additional functional criteria and insurance authorization.    Follow Up Recommendations  Skilled nursing-short term rehab (<3 hours/day)    Assistance Recommended at Discharge Frequent or constant Supervision/Assistance  Patient can return home with the following  A lot of help with walking and/or transfers;A lot of help with bathing/dressing/bathroom   Equipment Recommendations  BSC/3in1    Recommendations for Other Services      Precautions / Restrictions Precautions Precautions: Fall Restrictions Weight Bearing Restrictions: No       Mobility Bed Mobility                    Transfers                          Balance                                           ADL either performed or assessed with clinical judgement   ADL Overall ADL's : Needs assistance/impaired                                       General ADL Comments: supervision for doffing socks, MAX A for donning socks, SET UP for grooming tasks in seated, STS with MIN A with RW, short amb transfer  to bedside commode with MIN A with RW, step by step vcs for technique    Extremity/Trunk Assessment              Vision       Perception     Praxis      Cognition Arousal/Alertness: Awake/alert Behavior During Therapy: WFL for tasks assessed/performed Overall Cognitive Status: Within Functional Limits for tasks assessed                                          Exercises      Shoulder  Instructions       General Comments      Pertinent Vitals/ Pain       Pain Assessment Pain Assessment: Faces Faces Pain Scale: Hurts a little bit Pain Location: generalized Pain Intervention(s): Limited activity within patient's tolerance, Monitored during session, Repositioned  Home Living                                          Prior Functioning/Environment              Frequency  Min 2X/week        Progress Toward Goals  OT Goals(current goals can now be found in the care plan section)  Progress towards OT goals: Progressing toward goals     Plan Discharge plan remains appropriate;Frequency remains appropriate    Co-evaluation                 AM-PAC OT "6 Clicks" Daily Activity     Outcome Measure   Help from another person eating meals?: None Help from another person taking care of personal grooming?: A Little Help from another person toileting, which includes using toliet, bedpan, or urinal?: A Lot Help from another person bathing (including washing, rinsing, drying)?: A Lot Help from another person  to put on and taking off regular upper body clothing?: A Little Help from another person to put on and taking off regular lower body clothing?: A Lot 6 Click Score: 16    End of Session Equipment Utilized During Treatment: Gait belt;Rolling walker (2 wheels)  OT Visit Diagnosis: Other abnormalities of gait and mobility (R26.89);Repeated falls (R29.6);Muscle weakness (generalized) (M62.81)   Activity Tolerance Patient tolerated treatment well   Patient Left Other (comment) (on BSC, RN and NT aware of pt status and call bell within reach)   Nurse Communication Mobility status        Time: 7824-2353 OT Time Calculation (min): 15 min  Charges: OT General Charges $OT Visit: 1 Visit OT Treatments $Self Care/Home Management : 8-22 mins  Shanon Payor, OTD OTR/L  03/05/22, 10:19 AM

## 2022-03-05 NOTE — Progress Notes (Signed)
[facility] [place] NAME: [name]    MR#:  [id number]  DATE OF BIRTH:  1945-10-21  SUBJECTIVE:  patient worked earlier with physical therapy. Try to sit on bedside commode. Requesting suppository for constipation no family at bedside  VITALS:  Blood pressure 129/81, pulse 74, temperature 97.7 F (36.5 C), resp. rate 16, height '5\' 9"'$  (1.753 m), weight 111.6 kg, SpO2 100 %.  PHYSICAL EXAMINATION:   GENERAL:  76 y.o.-year-old patient lying in the bed with no acute distress. Obese LUNGS: Normal breath sounds bilaterally, no wheezing, rales, rhonchi.  CARDIOVASCULAR: S1, S2 normal. No murmurs, rubs, or gallops.  ABDOMEN: Soft, nontender, nondistended. Bowel sounds present.  EXTREMITIES: bilateral lower extremity are wrapped NEUROLOGIC: nonfocal  patient is alert and awake  LABORATORY PANEL:  CBC Recent Labs  Lab 03/05/22 0324  WBC 6.7  HGB 10.3*  HCT 32.8*  PLT 288    Chemistries  Recent Labs  Lab 03/03/22 0801 03/04/22 0434 03/05/22 0324  NA 138   < > 138  K 4.9   < > 4.4  CL 106   < > 107  CO2 24   < > 27  GLUCOSE 96   < > 89  BUN 32*   < > 22  CREATININE 1.90*   < > 1.21*  CALCIUM 8.7*   < > 8.7*  AST 17  --   --   ALT 17  --   --   ALKPHOS 55  --   --   BILITOT 0.5  --   --    < > = values in this interval not displayed.   Cardiac Enzymes No results for input(s): "TROPONINI" in the last 168 hours. RADIOLOGY:  ECHOCARDIOGRAM COMPLETE  Result Date: 03/04/2022    ECHOCARDIOGRAM REPORT   Patient Name:   [name] Date of Exam: 03/04/2022 Medical Rec #:  [id number]        Height:       69.0 in Accession #:    [id number]       Weight:       251.8 lb Date of Birth:  June 01, 1946        BSA:          2.278 m Patient Age:    58 years         BP:           133/46 mmHg Patient Gender: F                HR:           73 bpm. Exam Location:  [facility] Procedure: 2D Echo, Cardiac Doppler and Color Doppler Indications:      R55 Syncope  History:         Patient has prior history of Echocardiogram examinations, most                  recent 03/28/2017. CHF; Risk Factors:Hypertension, GERD and                  Sleep Apnea.  Sonographer:     Bernadene Person RDCS Referring Phys:  [id number] Loletha Grayer Diagnosing Phys: Nelva Bush MD IMPRESSIONS  1. Left ventricular ejection fraction, by estimation, is 50 to 55%. The left ventricle has low normal function. Left ventricular endocardial border not optimally defined to evaluate regional wall motion. The left ventricular internal cavity size was moderately dilated. There is mild left ventricular hypertrophy. Left  ventricular diastolic parameters are consistent with Grade I diastolic dysfunction (impaired relaxation).  2. Right ventricular systolic function is normal. The right ventricular size is mildly enlarged. Tricuspid regurgitation signal is inadequate for assessing PA pressure.  3. Left atrial size was mildly dilated.  4. Right atrial size was mildly dilated.  5. The mitral valve is abnormal. Mild mitral valve regurgitation. No evidence of mitral stenosis.  6. The aortic valve has an indeterminant number of cusps. There is mild calcification of the aortic valve. There is moderate thickening of the aortic valve. Aortic valve regurgitation is mild. Aortic valve sclerosis/calcification is present, without any  evidence of aortic stenosis.  7. The inferior vena cava is normal in size with <50% respiratory variability, suggesting right atrial pressure of 8 mmHg. FINDINGS  Left Ventricle: Left ventricular ejection fraction, by estimation, is 50 to 55%. The left ventricle has low normal function. Left ventricular endocardial border not optimally defined to evaluate regional wall motion. The left ventricular internal cavity  size was moderately dilated. There is mild left ventricular hypertrophy. Left ventricular diastolic parameters are consistent with Grade I diastolic dysfunction (impaired  relaxation). Right Ventricle: The right ventricular size is mildly enlarged. No increase in right ventricular wall thickness. Right ventricular systolic function is normal. Tricuspid regurgitation signal is inadequate for assessing PA pressure. Left Atrium: Left atrial size was mildly dilated. Right Atrium: Right atrial size was mildly dilated. Pericardium: There is no evidence of pericardial effusion. Mitral Valve: The mitral valve is abnormal. There is mild thickening of the mitral valve leaflet(s). There is mild calcification of the mitral valve leaflet(s). Mild mitral valve regurgitation. No evidence of mitral valve stenosis. Tricuspid Valve: The tricuspid valve is not well visualized. Tricuspid valve regurgitation is trivial. Aortic Valve: The aortic valve has an indeterminant number of cusps. There is mild calcification of the aortic valve. There is moderate thickening of the aortic valve. Aortic valve regurgitation is mild. Aortic regurgitation PHT measures 611 msec. Aortic  valve sclerosis/calcification is present, without any evidence of aortic stenosis. Aortic valve mean gradient measures 5.3 mmHg. Aortic valve peak gradient measures 11.1 mmHg. Aortic valve area, by VTI measures 2.54 cm. Pulmonic Valve: The pulmonic valve was not well visualized. Pulmonic valve regurgitation is mild. No evidence of pulmonic stenosis. Aorta: The aortic root and ascending aorta are structurally normal, with no evidence of dilitation. Pulmonary Artery: The pulmonary artery is not well seen. Venous: The inferior vena cava is normal in size with less than 50% respiratory variability, suggesting right atrial pressure of 8 mmHg. IAS/Shunts: The interatrial septum was not well visualized.  LEFT VENTRICLE PLAX 2D LVIDd:         5.93 cm      Diastology LVIDs:         4.36 cm      LV e' medial:    4.88 cm/s LV PW:         1.11 cm      LV E/e' medial:  13.3 LV IVS:        1.07 cm      LV e' lateral:   4.88 cm/s LVOT diam:     2.10 cm       LV E/e' lateral: 13.3 LV SV:         78 LV SV Index:   34 LVOT Area:     3.46 cm  LV Volumes (MOD) LV vol d, MOD A2C: 123.0 ml LV vol d, MOD A4C: 144.0 ml LV vol s,  MOD A2C: 50.9 ml LV vol s, MOD A4C: 72.9 ml LV SV MOD A2C:     72.1 ml LV SV MOD A4C:     144.0 ml LV SV MOD BP:      73.1 ml RIGHT VENTRICLE RV S prime:     18.50 cm/s TAPSE (M-mode): 2.9 cm LEFT ATRIUM             Index        RIGHT ATRIUM           Index LA diam:        5.10 cm 2.24 cm/m   RA Area:     20.20 cm LA Vol (A2C):   75.8 ml 33.27 ml/m  RA Volume:   61.00 ml  26.77 ml/m LA Vol (A4C):   82.5 ml 36.21 ml/m LA Biplane Vol: 82.7 ml 36.30 ml/m  AORTIC VALVE                     PULMONIC VALVE AV Area (Vmax):    2.19 cm      PR End Diast Vel: 8.88 msec AV Area (Vmean):   2.37 cm AV Area (VTI):     2.54 cm AV Vmax:           166.28 cm/s AV Vmean:          104.624 cm/s AV VTI:            0.306 m AV Peak Grad:      11.1 mmHg AV Mean Grad:      5.3 mmHg LVOT Vmax:         105.00 cm/s LVOT Vmean:        71.700 cm/s LVOT VTI:          0.224 m LVOT/AV VTI ratio: 0.73 AI PHT:            611 msec  AORTA Ao Root diam: 3.30 cm Ao Asc diam:  3.50 cm MITRAL VALVE MV Area (PHT): 3.07 cm    SHUNTS MV Decel Time: 247 msec    Systemic VTI:  0.22 m MV E velocity: 65.00 cm/s  Systemic Diam: 2.10 cm MV A velocity: 97.00 cm/s MV E/A ratio:  0.67 Christopher End MD Electronically signed by Nelva Bush MD Signature Date/Time: 03/04/2022/6:23:54 PM    Final     Assessment and Plan  76 year old female with obesity, history of breast cancer history of diastolic congestive heart failure, hypertension, neuropathy, sleep apnea, chronic lower extremity wounds followed up at the wound care center..  She came in with near syncope.  She was found to have acute kidney injury on chronic kidney disease per patient and daughter her torsemide was recently increased as outpatient  to 80 mg BID  Acute kidney injury superimposed on CKD ([place]) Acute kidney injury  on chronic kidney disease stage IIIa.  Baseline creatinine probably around 1.4.  2.07 on presentation came down to 1.33.  Discontinue fluids -- suspected due to his recent increase and torsemide dosing as outpatient.   Syncope -- overall blood pressure remains stable in fact rising. Will and start torsemide at 20 mg daily.   Chronic systolic CHF (congestive heart failure) (HCC) Last echo a few years back showed a normal EF.  Careful with IV fluids. Resume low-dose torsemide.  -- Echo shows EF of 50 to 81% with diastolic grade one dysfunction.   Anemia of chronic disease Ferritin above 100 making iron deficiency anemia unlikely.  Likely anemia of  chronic kidney disease.  Last hemoglobin 9.9.   Obesity (BMI 30-39.9) Current BMI listed at 37.18   Ulcers of both lower extremities, with unspecified severity ([place]) Appreciate wound care consult.  Continue to follow-up at the wound care center every Tuesday.   Weakness PT and OT evaluations recommending rehab.    As per daughter mostly scoots around with her rollator.  She does not walk too much.  MRI of the brain negative for stroke. -- Patient will discharged to home with home health. Hospital bed has been ordered   Essential hypertension -- resume clonidine and torsemide. Will add remaining blood pressure if needed.         Procedures: Family communication : daughter on the phone Consults : none CODE STATUS: full DVT Prophylaxis : heparin Level of care: Med-Surg Status is: Inpatient Remains inpatient appropriate because: resume blood pressure meds and monitoring blood pressure for one more day. Plan to discharge tomorrow    TOTAL TIME TAKING CARE OF THIS PATIENT: 35 minutes.  >50% time spent on counselling and coordination of care  Note: This dictation was prepared with Dragon dictation along with smaller phrase technology. Any transcriptional errors that result from this process are unintentional.  Fritzi Mandes M.D    [facility]   CC: Primary care physician; Donnie Coffin, MD

## 2022-03-05 NOTE — Progress Notes (Signed)
Brief Nutrition Note  Received call from [facility] to clarify diet order. Pt is on a soft diet, but requires her meats chopped up. RD will downgrade diet with dysphagia 3 diet for ease of intake.   Loistine Chance, RD, LDN, [place] Registered Dietitian II Certified Diabetes Care and Education Specialist Please refer to [facility] for RD and/or RD on-call/weekend/after hours pager

## 2022-03-06 DIAGNOSIS — N179 Acute kidney failure, unspecified: Secondary | ICD-10-CM | POA: Diagnosis not present

## 2022-03-06 DIAGNOSIS — N189 Chronic kidney disease, unspecified: Secondary | ICD-10-CM | POA: Diagnosis not present

## 2022-03-06 MED ORDER — CLONIDINE HCL 0.1 MG PO TABS: 0.1000 mg | ORAL_TABLET | Freq: Two times a day (BID) | ORAL | 11 refills | Status: DC

## 2022-03-06 MED ORDER — TORSEMIDE 40 MG PO TABS: 40.0000 mg | ORAL_TABLET | Freq: Every day | ORAL | 0 refills | Status: DC

## 2022-03-06 NOTE — Discharge Summary (Signed)
Physician Discharge Summary   Patient: [name] MRN: [id number] DOB: [birth year]  Admit date:     03/03/2022  Discharge date: 03/06/22  Discharge Physician: Fritzi Mandes   PCP: Donnie Coffin, MD   Recommendations at discharge:    F/u PCP in 7-10 days Keep log of BP at home Keep you weekly (Tuesday) appointments at the Wound center  Discharge Diagnoses: AKI superimposed on CKD III A Syncope suspected due to dehydration  Hospital Course:  76 year old female with obesity, history of breast cancer history of diastolic congestive heart failure, hypertension, neuropathy, sleep apnea, chronic lower extremity wounds followed up at the wound care center..  She came in with near syncope.  She was found to have acute kidney injury on chronic kidney disease per patient and daughter her torsemide was recently increased as outpatient  to 80 mg BID   Acute kidney injury superimposed on CKD ([place]) Acute kidney injury on chronic kidney disease stage IIIa.  Baseline creatinine probably around 1.4.  2.07 on presentation came down to 1.33.  Discontinue fluids -- suspected due to his recent increase and torsemide dosing as outpatient.   Syncope -- overall blood pressure remains stable in fact rising. Will and start torsemide at 40 mg daily.   Chronic systolic CHF (congestive heart failure) (HCC) Last echo a few years back showed a normal EF.  Careful with IV fluids. Resume low-dose torsemide.  -- Echo shows EF of 50 to 12% with diastolic grade one dysfunction.   Anemia of chronic disease Ferritin above 100 making iron deficiency anemia unlikely.  Likely anemia of chronic kidney disease.  Last hemoglobin 9.9.   Obesity (BMI 30-39.9) Current BMI listed at 37.18   Ulcers of both lower extremities, with unspecified severity ([place]) Appreciate wound care consult.  Continue to follow-up at the wound care center every Tuesday.   Weakness PT and OT evaluations recommending rehab.    As per daughter  mostly scoots around with her rollator.  She does not walk too much.  MRI of the brain negative for stroke. -- Patient will discharged to home with home health. Hospital bed has been ordered   Essential hypertension --resumed BP meds --dose adjusted --keep log of BP at home  Overall improving. D/c home with [facility]      Consultants: none Procedures performed: none  Disposition: Home health Diet recommendation:  Discharge Diet Orders (From admission, onward)     Start     Ordered   03/06/22 0000  Diet - low sodium heart healthy        03/06/22 0858           Cardiac diet DISCHARGE MEDICATION: Allergies as of 03/06/2022   No Known Allergies      Medication List     STOP taking these medications    clotrimazole 1 % cream Commonly known as: LOTRIMIN   methocarbamol 500 MG tablet Commonly known as: ROBAXIN       TAKE these medications    anastrozole 1 MG tablet Commonly known as: ARIMIDEX TAKE 1 TABLET BY MOUTH DAILY. PATIENT MUST MAKE AN APPT TO SEE DR RAO   aspirin EC 81 MG tablet Take 81 mg by mouth daily.   Bladder Control Pads Ex Absorb Misc Apply 1 Dose topically as needed.   cloNIDine 0.1 MG tablet Commonly known as: CATAPRES Take 1 tablet (0.1 mg total) by mouth 2 (two) times daily. What changed:  medication strength how much to take   [facility] Calcium + D3  600-20 MG-MCG Tabs Generic drug: Calcium Carb-Cholecalciferol TAKE 1 TABLET BY MOUTH TWICE A DAY   diclofenac Sodium 1 % Gel Commonly known as: VOLTAREN Apply 1 application topically as needed.   Docusate Sodium 100 MG capsule Take 100 mg by mouth 2 (two) times daily as needed.   isosorbide mononitrate 60 MG 24 hr tablet Commonly known as: Imdur Take 1 tablet (60 mg total) by mouth 2 (two) times daily.   loratadine 10 MG tablet Commonly known as: CLARITIN Take 10 mg by mouth daily.   losartan 25 MG tablet Commonly known as: COZAAR Take 1 tablet (25 mg total) by mouth daily.    MiraLax 17 GM/SCOOP powder Generic drug: polyethylene glycol powder SMARTSIG:2 Scoopful By Mouth Daily PRN   omeprazole 40 MG capsule Commonly known as: PRILOSEC Take 40 mg by mouth daily.   oxybutynin 5 MG tablet Commonly known as: DITROPAN Take 5 mg by mouth 2 (two) times daily.   potassium chloride SA 20 MEQ tablet Commonly known as: KLOR-CON M Take 1 tablet (20 mEq total) by mouth 2 (two) times daily.   Torsemide 40 MG Tabs Take 40 mg by mouth daily in the afternoon. What changed: when to take this   triamcinolone cream 0.1 % Commonly known as: KENALOG Apply 1 application topically 2 (two) times daily. Back of legs               Durable Medical Equipment  (From admission, onward)           Start     Ordered   03/06/22 0858  DME 3-in-1  Once        03/06/22 0858   03/06/22 0858  DME Walker  Once       Question Answer Comment  Walker: With 5 Inch Wheels   Patient needs a walker to treat with the following condition Weakness      03/06/22 0858   03/04/22 1546  For home use only DME Hospital bed  Once       Question Answer Comment  Length of Need Lifetime   The above medical condition requires: Patient requires the ability to reposition immediately   Head must be elevated greater than: 30 degrees   Bed type Semi-electric   Trapeze Bar Yes   Support Surface: Alternating Pressure Pad and Pump      03/04/22 1545              Discharge Care Instructions  (From admission, onward)           Start     Ordered   03/06/22 0000  Discharge wound care:       Comments: Wound care  Weekly      Comments: WOC to change Profore Light to the RLE.  Apply barrier ointment to the periwound. Cut to fit silver hydrofiber and place on the wound bed, top with foam dressing. Wrap with Profore light (3 layer) compression wrap. Change weekly.    Ok for patient to bring in compression stockings for LEs if she desires (assist to don) daily and remove at bedtime.   Otherwise keep elevated as much as possible.   03/06/22 0858            Follow-up Information     Aycock, Ngwe A, MD. Schedule an appointment as soon as possible for a visit in 1 week(s).   Specialty: Family Medicine Why: hospital f/u Contact information: [address] [place] [address] [phone]  Discharge Exam: Filed Weights   03/02/22 2208 03/04/22 0500 03/05/22 0500  Weight: 105.2 kg 114.2 kg 111.6 kg     Condition at discharge: stable  The results of significant diagnostics from this hospitalization (including imaging, microbiology, ancillary and laboratory) are listed below for reference.   Imaging Studies: ECHOCARDIOGRAM COMPLETE  Result Date: 03/04/2022    ECHOCARDIOGRAM REPORT   Patient Name:   KELECHI ASTARITA Date of Exam: 03/04/2022 Medical Rec #:  [id number]        Height:       69.0 in Accession #:    [id number]       Weight:       251.8 lb Date of Birth:  [birth year]        BSA:          2.278 m Patient Age:    82 years         BP:           133/46 mmHg Patient Gender: F                HR:           73 bpm. Exam Location:  [facility] Procedure: 2D Echo, Cardiac Doppler and Color Doppler Indications:     R55 Syncope  History:         Patient has prior history of Echocardiogram examinations, most                  recent 03/28/2017. CHF; Risk Factors:Hypertension, GERD and                  Sleep Apnea.  Sonographer:     Bernadene Person RDCS Referring Phys:  [id number] Loletha Grayer Diagnosing Phys: Nelva Bush MD IMPRESSIONS  1. Left ventricular ejection fraction, by estimation, is 50 to 55%. The left ventricle has low normal function. Left ventricular endocardial border not optimally defined to evaluate regional wall motion. The left ventricular internal cavity size was moderately dilated. There is mild left ventricular hypertrophy. Left ventricular diastolic parameters are consistent with Grade I diastolic dysfunction (impaired relaxation).   2. Right ventricular systolic function is normal. The right ventricular size is mildly enlarged. Tricuspid regurgitation signal is inadequate for assessing PA pressure.  3. Left atrial size was mildly dilated.  4. Right atrial size was mildly dilated.  5. The mitral valve is abnormal. Mild mitral valve regurgitation. No evidence of mitral stenosis.  6. The aortic valve has an indeterminant number of cusps. There is mild calcification of the aortic valve. There is moderate thickening of the aortic valve. Aortic valve regurgitation is mild. Aortic valve sclerosis/calcification is present, without any  evidence of aortic stenosis.  7. The inferior vena cava is normal in size with <50% respiratory variability, suggesting right atrial pressure of 8 mmHg. FINDINGS  Left Ventricle: Left ventricular ejection fraction, by estimation, is 50 to 55%. The left ventricle has low normal function. Left ventricular endocardial border not optimally defined to evaluate regional wall motion. The left ventricular internal cavity  size was moderately dilated. There is mild left ventricular hypertrophy. Left ventricular diastolic parameters are consistent with Grade I diastolic dysfunction (impaired relaxation). Right Ventricle: The right ventricular size is mildly enlarged. No increase in right ventricular wall thickness. Right ventricular systolic function is normal. Tricuspid regurgitation signal is inadequate for assessing PA pressure. Left Atrium: Left atrial size was mildly dilated. Right Atrium: Right atrial size was mildly dilated. Pericardium: There is no evidence of pericardial effusion. Mitral  Valve: The mitral valve is abnormal. There is mild thickening of the mitral valve leaflet(s). There is mild calcification of the mitral valve leaflet(s). Mild mitral valve regurgitation. No evidence of mitral valve stenosis. Tricuspid Valve: The tricuspid valve is not well visualized. Tricuspid valve regurgitation is trivial. Aortic  Valve: The aortic valve has an indeterminant number of cusps. There is mild calcification of the aortic valve. There is moderate thickening of the aortic valve. Aortic valve regurgitation is mild. Aortic regurgitation PHT measures 611 msec. Aortic  valve sclerosis/calcification is present, without any evidence of aortic stenosis. Aortic valve mean gradient measures 5.3 mmHg. Aortic valve peak gradient measures 11.1 mmHg. Aortic valve area, by VTI measures 2.54 cm. Pulmonic Valve: The pulmonic valve was not well visualized. Pulmonic valve regurgitation is mild. No evidence of pulmonic stenosis. Aorta: The aortic root and ascending aorta are structurally normal, with no evidence of dilitation. Pulmonary Artery: The pulmonary artery is not well seen. Venous: The inferior vena cava is normal in size with less than 50% respiratory variability, suggesting right atrial pressure of 8 mmHg. IAS/Shunts: The interatrial septum was not well visualized.  LEFT VENTRICLE PLAX 2D LVIDd:         5.93 cm      Diastology LVIDs:         4.36 cm      LV e' medial:    4.88 cm/s LV PW:         1.11 cm      LV E/e' medial:  13.3 LV IVS:        1.07 cm      LV e' lateral:   4.88 cm/s LVOT diam:     2.10 cm      LV E/e' lateral: 13.3 LV SV:         78 LV SV Index:   34 LVOT Area:     3.46 cm  LV Volumes (MOD) LV vol d, MOD A2C: 123.0 ml LV vol d, MOD A4C: 144.0 ml LV vol s, MOD A2C: 50.9 ml LV vol s, MOD A4C: 72.9 ml LV SV MOD A2C:     72.1 ml LV SV MOD A4C:     144.0 ml LV SV MOD BP:      73.1 ml RIGHT VENTRICLE RV S prime:     18.50 cm/s TAPSE (M-mode): 2.9 cm LEFT ATRIUM             Index        RIGHT ATRIUM           Index LA diam:        5.10 cm 2.24 cm/m   RA Area:     20.20 cm LA Vol (A2C):   75.8 ml 33.27 ml/m  RA Volume:   61.00 ml  26.77 ml/m LA Vol (A4C):   82.5 ml 36.21 ml/m LA Biplane Vol: 82.7 ml 36.30 ml/m  AORTIC VALVE                     PULMONIC VALVE AV Area (Vmax):    2.19 cm      PR End Diast Vel: 8.88 msec AV  Area (Vmean):   2.37 cm AV Area (VTI):     2.54 cm AV Vmax:           166.28 cm/s AV Vmean:          104.624 cm/s AV VTI:            0.306  m AV Peak Grad:      11.1 mmHg AV Mean Grad:      5.3 mmHg LVOT Vmax:         105.00 cm/s LVOT Vmean:        71.700 cm/s LVOT VTI:          0.224 m LVOT/AV VTI ratio: 0.73 AI PHT:            611 msec  AORTA Ao Root diam: 3.30 cm Ao Asc diam:  3.50 cm MITRAL VALVE MV Area (PHT): 3.07 cm    SHUNTS MV Decel Time: 247 msec    Systemic VTI:  0.22 m MV E velocity: 65.00 cm/s  Systemic Diam: 2.10 cm MV A velocity: 97.00 cm/s MV E/A ratio:  0.67 Harrell Gave End MD Electronically signed by Nelva Bush MD Signature Date/Time: 03/04/2022/6:23:54 PM    Final    MR BRAIN WO CONTRAST  Result Date: 03/03/2022 CLINICAL DATA:  Transient ischemic attack (TIA) EXAM: MRI HEAD WITHOUT CONTRAST TECHNIQUE: Multiplanar, multiecho pulse sequences of the brain and surrounding structures were obtained without intravenous contrast. COMPARISON:  2018 FINDINGS: Brain: There is no acute infarction or intracranial hemorrhage. There is no intracranial mass, mass effect, or edema. There is no hydrocephalus or extra-axial fluid collection. Ventricles and sulci are within normal limits in size and configuration. Patchy and confluent areas of T2 hyperintensity in the supratentorial white matter nonspecific but may reflect mild to moderate chronic microvascular ischemic changes. Vascular: Major vessel flow voids at the skull base are preserved. Skull and upper cervical spine: Normal marrow signal is preserved. Sinuses/Orbits: Paranasal sinuses are aerated. Left lens replacement. Other: Sella is unremarkable.  Mastoid air cells are clear. IMPRESSION: No acute infarction, hemorrhage, or mass. Mild to moderate chronic microvascular ischemic changes. Electronically Signed   By: Macy Mis M.D.   On: 03/03/2022 12:31   CT HEAD WO CONTRAST (5MM)  Result Date: 03/02/2022 CLINICAL DATA:  Headache, new or  worsening (Age >= 50y) EXAM: CT HEAD WITHOUT CONTRAST TECHNIQUE: Contiguous axial images were obtained from the base of the skull through the vertex without intravenous contrast. RADIATION DOSE REDUCTION: This exam was performed according to the departmental dose-optimization program which includes automated exposure control, adjustment of the mA and/or kV according to patient size and/or use of iterative reconstruction technique. COMPARISON:  None Available. FINDINGS: Brain: Normal anatomic configuration. Parenchymal volume loss is commensurate with the patient's age. Mild periventricular white matter changes are present likely reflecting the sequela of small vessel ischemia. No abnormal intra or extra-axial mass lesion or fluid collection. No abnormal mass effect or midline shift. No evidence of acute intracranial hemorrhage or infarct. Ventricular size is normal. Cerebellum unremarkable. Vascular: No asymmetric hyperdense vasculature at the skull base. Skull: Intact Sinuses/Orbits: Paranasal sinuses are clear. Orbits are unremarkable. Other: Mastoid air cells and middle ear cavities are clear. IMPRESSION: No acute intracranial abnormality. Mild senescent change. Electronically Signed   By: Fidela Salisbury M.D.   On: 03/02/2022 22:56    Microbiology: Results for orders placed or performed during the hospital encounter of 01/29/22  Aerobic/Anaerobic Culture w Gram Stain (surgical/deep wound)     Status: None   Collection Time: 01/29/22  6:03 PM   Specimen: Wound  Result Value Ref Range Status   Specimen Description   Final    WOUND RIGHT LEG Performed at [facility], [address] [place], [place] [address]    Special Requests   Final    NONE Performed  at [facility], [address] [place], [place] [address]    Gram Stain   Final    NO WBC SEEN ABUNDANT GRAM POSITIVE COCCI RARE GRAM NEGATIVE RODS    Culture   Final    ABUNDANT PSEUDOMONAS AERUGINOSA ABUNDANT SERRATIA  MARCESCENS MODERATE PROTEUS MIRABILIS ABUNDANT ENTEROCOCCUS FAECALIS NO ANAEROBES ISOLATED Performed at [facility], [place] [address] [place], [place] [address]    Report Status 02/03/2022 FINAL  Final   Organism ID, Bacteria PSEUDOMONAS AERUGINOSA  Final   Organism ID, Bacteria SERRATIA MARCESCENS  Final   Organism ID, Bacteria PROTEUS MIRABILIS  Final   Organism ID, Bacteria ENTEROCOCCUS FAECALIS  Final      Susceptibility   Enterococcus faecalis - MIC*    AMPICILLIN <=2 SENSITIVE Sensitive     VANCOMYCIN 1 SENSITIVE Sensitive     GENTAMICIN SYNERGY SENSITIVE Sensitive     * ABUNDANT ENTEROCOCCUS FAECALIS   Pseudomonas aeruginosa - MIC*    CEFTAZIDIME 8 SENSITIVE Sensitive     CIPROFLOXACIN <=0.25 SENSITIVE Sensitive     GENTAMICIN <=1 SENSITIVE Sensitive     IMIPENEM 0.5 SENSITIVE Sensitive     PIP/TAZO <=4 SENSITIVE Sensitive     CEFEPIME 4 SENSITIVE Sensitive     * ABUNDANT PSEUDOMONAS AERUGINOSA   Proteus mirabilis - MIC*    AMPICILLIN <=2 SENSITIVE Sensitive     CEFAZOLIN <=4 SENSITIVE Sensitive     CEFEPIME <=0.12 SENSITIVE Sensitive     CEFTAZIDIME <=1 SENSITIVE Sensitive     CEFTRIAXONE <=0.25 SENSITIVE Sensitive     CIPROFLOXACIN <=0.25 SENSITIVE Sensitive     GENTAMICIN <=1 SENSITIVE Sensitive     IMIPENEM 2 SENSITIVE Sensitive     TRIMETH/SULFA <=20 SENSITIVE Sensitive     AMPICILLIN/SULBACTAM <=2 SENSITIVE Sensitive     PIP/TAZO <=4 SENSITIVE Sensitive     * MODERATE PROTEUS MIRABILIS   Serratia marcescens - MIC*    CEFAZOLIN >=64 RESISTANT Resistant     CEFEPIME <=0.12 SENSITIVE Sensitive     CEFTAZIDIME <=1 SENSITIVE Sensitive     CEFTRIAXONE <=0.25 SENSITIVE Sensitive     CIPROFLOXACIN <=0.25 SENSITIVE Sensitive     GENTAMICIN <=1 SENSITIVE Sensitive     TRIMETH/SULFA <=20 SENSITIVE Sensitive     * ABUNDANT SERRATIA MARCESCENS    Labs: CBC: Recent Labs  Lab 03/02/22 2215 03/03/22 0801 03/04/22 0434 03/05/22 0324  WBC 7.8 7.0  --  6.7   NEUTROABS  --  3.9  --   --   HGB 10.7* 10.1* 9.9* 10.3*  HCT 35.1* 33.6*  --  32.8*  MCV 87.1 88.7  --  86.3  PLT 327 271  --  440   Basic Metabolic Panel: Recent Labs  Lab 03/02/22 2215 03/03/22 0801 03/04/22 0434 03/05/22 0324  NA 141 138 139 138  K 5.0 4.9 4.4 4.4  CL 108 106 109 107  CO2 '27 24 27 27  '$ GLUCOSE 117* 96 85 89  BUN 35* 32* 26* 22  CREATININE 2.07* 1.90* 1.33* 1.21*  CALCIUM 9.6 8.7* 8.7* 8.7*   Liver Function Tests: Recent Labs  Lab 03/02/22 2215 03/03/22 0801  AST 18 17  ALT 18 17  ALKPHOS 62 55  BILITOT 0.5 0.5  PROT 7.9 6.7  ALBUMIN 3.6 2.9*   CBG: No results for input(s): "GLUCAP" in the last 168 hours.  Discharge time spent: greater than 30 minutes.  Signed: Fritzi Mandes, MD [facility] 03/06/2022

## 2022-03-06 NOTE — Care Management Important Message (Signed)
Important Message  Patient Details  Name: Brilynn Biasi MRN: [id number] Date of Birth: 1945/10/08   Medicare Important Message Given:  Yes     Dannette Barbara 03/06/2022, 1:27 PM

## 2022-03-06 NOTE — Discharge Instructions (Signed)
Patient to keep log of blood pressure at home and review with PCP

## 2022-03-06 NOTE — Progress Notes (Signed)
Occupational Therapy Treatment Patient Details Name: [name] MRN: [id number] DOB: 08/08/1946 Today's Date: 03/06/2022   History of present illness Pt is a 76 y/o F admitted on 03/03/22 after presenting with c/o recurrent near syncope & fall x 1 week but now becoming more frequent. PMH: CHFpEF, HTN, breast CA on oral chemotherapy, COPD, OSA not on CPAP, GERD   OT comments  Chart reviewed, pt greeted in room following bath from NT. Tx session targeted progressing functional mobility/ADL task completion to facilitate safe ADL completion at home. Improvements noted in functional mobility however pt continues to be limited in endurance and activity tolerance. Education provided re: DME use and and home safety. OT continues to recommend discharge to STR to address deficits. Pt is left in bedside chair, NAD, all needs met. OT will follow acutely.    Recommendations for follow up therapy are one component of a multi-disciplinary discharge planning process, led by the attending physician.  Recommendations may be updated based on patient status, additional functional criteria and insurance authorization.    Follow Up Recommendations  Skilled nursing-short term rehab (<3 hours/day)    Assistance Recommended at Discharge Frequent or constant Supervision/Assistance  Patient can return home with the following  A lot of help with walking and/or transfers;A lot of help with bathing/dressing/bathroom   Equipment Recommendations  BSC/3in1    Recommendations for Other Services      Precautions / Restrictions Precautions Precautions: Fall Restrictions Weight Bearing Restrictions: No       Mobility Bed Mobility                    Transfers                         Balance Overall balance assessment: Needs assistance Sitting-balance support: Feet supported, Bilateral upper extremity supported Sitting balance-Leahy Scale: Fair     Standing balance support: Bilateral  upper extremity supported, Reliant on assistive device for balance Standing balance-Leahy Scale: Poor                             ADL either performed or assessed with clinical judgement   ADL Overall ADL's : Needs assistance/impaired                                       General ADL Comments: Pt performed supine>sit with HOB raised with supervision, STS with MIN A, short amb transfer approx 5 feet with RW with CGA. UB dressing completed with MOD A. Frequent vcs provided for safety and technique.    Extremity/Trunk Assessment              Vision       Perception     Praxis      Cognition Arousal/Alertness: Awake/alert Behavior During Therapy: WFL for tasks assessed/performed Overall Cognitive Status: Within Functional Limits for tasks assessed                                          Exercises      Shoulder Instructions       General Comments      Pertinent Vitals/ Pain       Pain Assessment Pain Assessment: No/denies pain  Home Living                                            Prior Functioning/Environment              Frequency  Min 2X/week        Progress Toward Goals  OT Goals(current goals can now be found in the care plan section)  Progress towards OT goals: Progressing toward goals     Plan Discharge plan remains appropriate;Frequency remains appropriate    Co-evaluation                 AM-PAC OT "6 Clicks" Daily Activity     Outcome Measure   Help from another person eating meals?: None Help from another person taking care of personal grooming?: A Little Help from another person toileting, which includes using toliet, bedpan, or urinal?: A Lot Help from another person bathing (including washing, rinsing, drying)?: A Lot Help from another person to put on and taking off regular upper body clothing?: A Little Help from another person to put on and taking off  regular lower body clothing?: A Lot 6 Click Score: 16    End of Session Equipment Utilized During Treatment: Rolling walker (2 wheels)  OT Visit Diagnosis: Other abnormalities of gait and mobility (R26.89);Repeated falls (R29.6);Muscle weakness (generalized) (M62.81)   Activity Tolerance Patient tolerated treatment well   Patient Left in chair;with chair alarm set;with call bell/phone within reach   Nurse Communication Mobility status        Time: 1131-1146 OT Time Calculation (min): 15 min  Charges: OT General Charges $OT Visit: 1 Visit OT Treatments $Self Care/Home Management : 8-22 mins Anna Unwin, OTD OTR/L  03/06/22, 12:53 PM  

## 2022-03-06 NOTE — TOC Transition Note (Signed)
Transition of Care [facility]) - CM/SW Discharge Note   Patient Details  Name: Evlyn Amason MRN: [id number] Date of Birth: [birth year]  [facility] CM/SW Contact:  Donnelly Angelica, LCSW Phone Number: 03/06/2022, 11:20 AM   Clinical Narrative:    Pt has orders to discharge home today with [facility]. HH has been arranged with [facility]. Tommi Rumps stated that they will start seeing the pt at the first of the week. Zach with [facility] confirmed that the hospital bed will be delivered today. No further concerns. CSW signing off.    Final next level of care: [place] Barriers to Discharge: Barriers Resolved   Patient Goals and CMS Choice Patient states their goals for this hospitalization and ongoing recovery are:: to go home CMS Medicare.gov Compare Post Acute Care list provided to:: Patient Represenative (must comment) (daughter Governor Specking) Choice offered to / list presented to : Adult Children  Discharge Placement                    Patient and family notified of of transfer: 03/06/22  Discharge Plan and Services     Post Acute Care Choice: Home Health          DME Arranged: Hospital bed DME Agency: [facility] Date DME Agency Contacted: 03/06/22   Representative spoke with at DME Agency: Thedore Mins HH Arranged: OT, PT, RN [facility] Agency: [place] Date [place]: 03/06/22   Representative spoke with at [place]: [place] ([place]) Interventions     Readmission Risk Interventions     No data to display

## 2022-03-07 DIAGNOSIS — N189 Chronic kidney disease, unspecified: Secondary | ICD-10-CM | POA: Diagnosis not present

## 2022-03-07 DIAGNOSIS — N179 Acute kidney failure, unspecified: Secondary | ICD-10-CM | POA: Diagnosis not present

## 2022-03-07 NOTE — Plan of Care (Signed)
  Problem: Education: Goal: Knowledge of General Education information will improve Description: Including pain rating scale, medication(s)/side effects and non-pharmacologic comfort measures 03/07/2022 0802 by Gwendel Hanson, LPN Outcome: Adequate for Discharge 03/07/2022 0802 by Gwendel Hanson, LPN Outcome: Progressing   Problem: Health Behavior/Discharge Planning: Goal: Ability to manage health-related needs will improve 03/07/2022 0802 by Gwendel Hanson, LPN Outcome: Adequate for Discharge 03/07/2022 0802 by Gwendel Hanson, LPN Outcome: Progressing   Problem: Clinical Measurements: Goal: Ability to maintain clinical measurements within normal limits will improve 03/07/2022 0802 by Gwendel Hanson, LPN Outcome: Adequate for Discharge 03/07/2022 0802 by Gwendel Hanson, LPN Outcome: Progressing Goal: Will remain free from infection 03/07/2022 0802 by Gwendel Hanson, LPN Outcome: Adequate for Discharge 03/07/2022 0802 by Gwendel Hanson, LPN Outcome: Progressing Goal: Diagnostic test results will improve 03/07/2022 0802 by Gwendel Hanson, LPN Outcome: Adequate for Discharge 03/07/2022 0802 by Gwendel Hanson, LPN Outcome: Progressing Goal: Respiratory complications will improve 03/07/2022 0802 by Gwendel Hanson, LPN Outcome: Adequate for Discharge 03/07/2022 0802 by Gwendel Hanson, LPN Outcome: Progressing Goal: Cardiovascular complication will be avoided 03/07/2022 0802 by Gwendel Hanson, LPN Outcome: Adequate for Discharge 03/07/2022 0802 by Gwendel Hanson, LPN Outcome: Progressing   Problem: Activity: Goal: Risk for activity intolerance will decrease 03/07/2022 0802 by Gwendel Hanson, LPN Outcome: Adequate for Discharge 03/07/2022 0802 by Gwendel Hanson, LPN Outcome: Progressing   Problem: Nutrition: Goal: Adequate nutrition will be maintained 03/07/2022 0802 by Gwendel Hanson, LPN Outcome: Adequate for Discharge 03/07/2022 0802 by Gwendel Hanson, LPN Outcome: Progressing    Problem: Coping: Goal: Level of anxiety will decrease 03/07/2022 0802 by Gwendel Hanson, LPN Outcome: Adequate for Discharge 03/07/2022 0802 by Gwendel Hanson, LPN Outcome: Progressing   Problem: Elimination: Goal: Will not experience complications related to bowel motility 03/07/2022 0802 by Gwendel Hanson, LPN Outcome: Adequate for Discharge 03/07/2022 0802 by Gwendel Hanson, LPN Outcome: Progressing Goal: Will not experience complications related to urinary retention 03/07/2022 0802 by Gwendel Hanson, LPN Outcome: Adequate for Discharge 03/07/2022 0802 by Gwendel Hanson, LPN Outcome: Progressing   Problem: Pain Managment: Goal: General experience of comfort will improve 03/07/2022 0802 by Gwendel Hanson, LPN Outcome: Adequate for Discharge 03/07/2022 0802 by Gwendel Hanson, LPN Outcome: Progressing   Problem: Safety: Goal: Ability to remain free from injury will improve 03/07/2022 0802 by Gwendel Hanson, LPN Outcome: Adequate for Discharge 03/07/2022 0802 by Gwendel Hanson, LPN Outcome: Progressing   Problem: Skin Integrity: Goal: Risk for impaired skin integrity will decrease 03/07/2022 0802 by Gwendel Hanson, LPN Outcome: Adequate for Discharge 03/07/2022 0802 by Gwendel Hanson, LPN Outcome: Progressing

## 2022-03-07 NOTE — Discharge Summary (Signed)
Physician Discharge Summary   Patient: [name] MRN: [id number] DOB: 1946-07-23  Admit date:     03/03/2022  Discharge date: 03/07/22  Discharge Physician: Fritzi Mandes   PCP: Donnie Coffin, MD   Recommendations at discharge:    F/u PCP in 7-10 days Keep log of BP at home Keep you weekly (Tuesday) appointments at the Wound center  Discharge Diagnoses: AKI superimposed on CKD III A Syncope suspected due to dehydration  Hospital Course:  76 year old female with obesity, history of breast cancer history of diastolic congestive heart failure, hypertension, neuropathy, sleep apnea, chronic lower extremity wounds followed up at the wound care center..  She came in with near syncope.  She was found to have acute kidney injury on chronic kidney disease per patient and daughter her torsemide was recently increased as outpatient  to 80 mg BID   Acute kidney injury superimposed on CKD ([place]) Acute kidney injury on chronic kidney disease stage IIIa.  Baseline creatinine probably around 1.4.  2.07 on presentation came down to 1.33.  Discontinue fluids -- suspected due to his recent increase and torsemide dosing as outpatient.   Syncope -- overall blood pressure remains stable in fact rising. Will and start torsemide at 40 mg daily.   Chronic systolic CHF (congestive heart failure) (HCC) Last echo a few years back showed a normal EF.  Careful with IV fluids. Resume low-dose torsemide.  -- Echo shows EF of 50 to 03% with diastolic grade one dysfunction.   Anemia of chronic disease Ferritin above 100 making iron deficiency anemia unlikely.  Likely anemia of chronic kidney disease.  Last hemoglobin 9.9.   Obesity (BMI 30-39.9) Current BMI listed at 37.18   Ulcers of both lower extremities, with unspecified severity ([place]) Appreciate wound care consult.  Continue to follow-up at the wound care center every Tuesday.   Weakness PT and OT evaluations recommending rehab.    As per daughter  mostly scoots around with her rollator.  She does not walk too much.  MRI of the brain negative for stroke. -- Patient will discharged to home with home health. Hospital bed has been ordered   Essential hypertension --resumed BP meds --dose adjusted --keep log of BP at home  Overall improving. D/c home with [facility]      Consultants: none Procedures performed: none  Disposition: Home health Diet recommendation:  Discharge Diet Orders (From admission, onward)     Start     Ordered   03/06/22 0000  Diet - low sodium heart healthy        03/06/22 0858           Cardiac diet DISCHARGE MEDICATION: Allergies as of 03/07/2022   No Known Allergies      Medication List     STOP taking these medications    clotrimazole 1 % cream Commonly known as: LOTRIMIN   methocarbamol 500 MG tablet Commonly known as: ROBAXIN       TAKE these medications    anastrozole 1 MG tablet Commonly known as: ARIMIDEX TAKE 1 TABLET BY MOUTH DAILY. PATIENT MUST MAKE AN APPT TO SEE DR RAO   aspirin EC 81 MG tablet Take 81 mg by mouth daily.   Bladder Control Pads Ex Absorb Misc Apply 1 Dose topically as needed.   cloNIDine 0.1 MG tablet Commonly known as: CATAPRES Take 1 tablet (0.1 mg total) by mouth 2 (two) times daily. What changed:  medication strength how much to take   [facility] Calcium + D3  600-20 MG-MCG Tabs Generic drug: Calcium Carb-Cholecalciferol TAKE 1 TABLET BY MOUTH TWICE A DAY   diclofenac Sodium 1 % Gel Commonly known as: VOLTAREN Apply 1 application topically as needed.   Docusate Sodium 100 MG capsule Take 100 mg by mouth 2 (two) times daily as needed.   isosorbide mononitrate 60 MG 24 hr tablet Commonly known as: Imdur Take 1 tablet (60 mg total) by mouth 2 (two) times daily.   loratadine 10 MG tablet Commonly known as: CLARITIN Take 10 mg by mouth daily.   losartan 25 MG tablet Commonly known as: COZAAR Take 1 tablet (25 mg total) by mouth daily.    MiraLax 17 GM/SCOOP powder Generic drug: polyethylene glycol powder SMARTSIG:2 Scoopful By Mouth Daily PRN   omeprazole 40 MG capsule Commonly known as: PRILOSEC Take 40 mg by mouth daily.   oxybutynin 5 MG tablet Commonly known as: DITROPAN Take 5 mg by mouth 2 (two) times daily.   potassium chloride SA 20 MEQ tablet Commonly known as: KLOR-CON M Take 1 tablet (20 mEq total) by mouth 2 (two) times daily.   Torsemide 40 MG Tabs Take 40 mg by mouth daily in the afternoon. What changed: when to take this   triamcinolone cream 0.1 % Commonly known as: KENALOG Apply 1 application topically 2 (two) times daily. Back of legs               Durable Medical Equipment  (From admission, onward)           Start     Ordered   03/06/22 0858  DME 3-in-1  Once        03/06/22 0858   03/06/22 0858  DME Walker  Once       Question Answer Comment  Walker: With 5 Inch Wheels   Patient needs a walker to treat with the following condition Weakness      03/06/22 0858   03/04/22 1546  For home use only DME Hospital bed  Once       Question Answer Comment  Length of Need Lifetime   The above medical condition requires: Patient requires the ability to reposition immediately   Head must be elevated greater than: 30 degrees   Bed type Semi-electric   Trapeze Bar Yes   Support Surface: Alternating Pressure Pad and Pump      03/04/22 1545              Discharge Care Instructions  (From admission, onward)           Start     Ordered   03/06/22 0000  Discharge wound care:       Comments: Wound care  Weekly      Comments: WOC to change Profore Light to the RLE.  Apply barrier ointment to the periwound. Cut to fit silver hydrofiber and place on the wound bed, top with foam dressing. Wrap with Profore light (3 layer) compression wrap. Change weekly.    Ok for patient to bring in compression stockings for LEs if she desires (assist to don) daily and remove at bedtime.   Otherwise keep elevated as much as possible.   03/06/22 4034            Follow-up Information     Donnie Coffin, MD. Go on 03/13/2022.   Specialty: Family Medicine Why: hospital f/u;  Appt @ 2:20 pm Contact information: [place] [address] [phone]  Discharge Exam: Filed Weights   03/04/22 0500 03/05/22 0500 03/07/22 0500  Weight: 114.2 kg 111.6 kg 104.6 kg     Condition at discharge: stable  The results of significant diagnostics from this hospitalization (including imaging, microbiology, ancillary and laboratory) are listed below for reference.   Imaging Studies: ECHOCARDIOGRAM COMPLETE  Result Date: 03/04/2022    ECHOCARDIOGRAM REPORT   Patient Name:   JUSTYCE YEATER Date of Exam: 03/04/2022 Medical Rec #:  [id number]        Height:       69.0 in Accession #:    [id number]       Weight:       251.8 lb Date of Birth:  [birth year]        BSA:          2.278 m Patient Age:    52 years         BP:           133/46 mmHg Patient Gender: F                HR:           73 bpm. Exam Location:  [facility] Procedure: 2D Echo, Cardiac Doppler and Color Doppler Indications:     R55 Syncope  History:         Patient has prior history of Echocardiogram examinations, most                  recent 03/28/2017. CHF; Risk Factors:Hypertension, GERD and                  Sleep Apnea.  Sonographer:     Bernadene Person RDCS Referring Phys:  [id number] Loletha Grayer Diagnosing Phys: Nelva Bush MD IMPRESSIONS  1. Left ventricular ejection fraction, by estimation, is 50 to 55%. The left ventricle has low normal function. Left ventricular endocardial border not optimally defined to evaluate regional wall motion. The left ventricular internal cavity size was moderately dilated. There is mild left ventricular hypertrophy. Left ventricular diastolic parameters are consistent with Grade I diastolic dysfunction (impaired relaxation).  2. Right ventricular systolic  function is normal. The right ventricular size is mildly enlarged. Tricuspid regurgitation signal is inadequate for assessing PA pressure.  3. Left atrial size was mildly dilated.  4. Right atrial size was mildly dilated.  5. The mitral valve is abnormal. Mild mitral valve regurgitation. No evidence of mitral stenosis.  6. The aortic valve has an indeterminant number of cusps. There is mild calcification of the aortic valve. There is moderate thickening of the aortic valve. Aortic valve regurgitation is mild. Aortic valve sclerosis/calcification is present, without any  evidence of aortic stenosis.  7. The inferior vena cava is normal in size with <50% respiratory variability, suggesting right atrial pressure of 8 mmHg. FINDINGS  Left Ventricle: Left ventricular ejection fraction, by estimation, is 50 to 55%. The left ventricle has low normal function. Left ventricular endocardial border not optimally defined to evaluate regional wall motion. The left ventricular internal cavity  size was moderately dilated. There is mild left ventricular hypertrophy. Left ventricular diastolic parameters are consistent with Grade I diastolic dysfunction (impaired relaxation). Right Ventricle: The right ventricular size is mildly enlarged. No increase in right ventricular wall thickness. Right ventricular systolic function is normal. Tricuspid regurgitation signal is inadequate for assessing PA pressure. Left Atrium: Left atrial size was mildly dilated. Right Atrium: Right atrial size was mildly dilated. Pericardium: There is no evidence of pericardial effusion. Mitral  Valve: The mitral valve is abnormal. There is mild thickening of the mitral valve leaflet(s). There is mild calcification of the mitral valve leaflet(s). Mild mitral valve regurgitation. No evidence of mitral valve stenosis. Tricuspid Valve: The tricuspid valve is not well visualized. Tricuspid valve regurgitation is trivial. Aortic Valve: The aortic valve has an  indeterminant number of cusps. There is mild calcification of the aortic valve. There is moderate thickening of the aortic valve. Aortic valve regurgitation is mild. Aortic regurgitation PHT measures 611 msec. Aortic  valve sclerosis/calcification is present, without any evidence of aortic stenosis. Aortic valve mean gradient measures 5.3 mmHg. Aortic valve peak gradient measures 11.1 mmHg. Aortic valve area, by VTI measures 2.54 cm. Pulmonic Valve: The pulmonic valve was not well visualized. Pulmonic valve regurgitation is mild. No evidence of pulmonic stenosis. Aorta: The aortic root and ascending aorta are structurally normal, with no evidence of dilitation. Pulmonary Artery: The pulmonary artery is not well seen. Venous: The inferior vena cava is normal in size with less than 50% respiratory variability, suggesting right atrial pressure of 8 mmHg. IAS/Shunts: The interatrial septum was not well visualized.  LEFT VENTRICLE PLAX 2D LVIDd:         5.93 cm      Diastology LVIDs:         4.36 cm      LV e' medial:    4.88 cm/s LV PW:         1.11 cm      LV E/e' medial:  13.3 LV IVS:        1.07 cm      LV e' lateral:   4.88 cm/s LVOT diam:     2.10 cm      LV E/e' lateral: 13.3 LV SV:         78 LV SV Index:   34 LVOT Area:     3.46 cm  LV Volumes (MOD) LV vol d, MOD A2C: 123.0 ml LV vol d, MOD A4C: 144.0 ml LV vol s, MOD A2C: 50.9 ml LV vol s, MOD A4C: 72.9 ml LV SV MOD A2C:     72.1 ml LV SV MOD A4C:     144.0 ml LV SV MOD BP:      73.1 ml RIGHT VENTRICLE RV S prime:     18.50 cm/s TAPSE (M-mode): 2.9 cm LEFT ATRIUM             Index        RIGHT ATRIUM           Index LA diam:        5.10 cm 2.24 cm/m   RA Area:     20.20 cm LA Vol (A2C):   75.8 ml 33.27 ml/m  RA Volume:   61.00 ml  26.77 ml/m LA Vol (A4C):   82.5 ml 36.21 ml/m LA Biplane Vol: 82.7 ml 36.30 ml/m  AORTIC VALVE                     PULMONIC VALVE AV Area (Vmax):    2.19 cm      PR End Diast Vel: 8.88 msec AV Area (Vmean):   2.37 cm AV Area  (VTI):     2.54 cm AV Vmax:           166.28 cm/s AV Vmean:          104.624 cm/s AV VTI:            0.306  m AV Peak Grad:      11.1 mmHg AV Mean Grad:      5.3 mmHg LVOT Vmax:         105.00 cm/s LVOT Vmean:        71.700 cm/s LVOT VTI:          0.224 m LVOT/AV VTI ratio: 0.73 AI PHT:            611 msec  AORTA Ao Root diam: 3.30 cm Ao Asc diam:  3.50 cm MITRAL VALVE MV Area (PHT): 3.07 cm    SHUNTS MV Decel Time: 247 msec    Systemic VTI:  0.22 m MV E velocity: 65.00 cm/s  Systemic Diam: 2.10 cm MV A velocity: 97.00 cm/s MV E/A ratio:  0.67 Harrell Gave End MD Electronically signed by Nelva Bush MD Signature Date/Time: 03/04/2022/6:23:54 PM    Final    MR BRAIN WO CONTRAST  Result Date: 03/03/2022 CLINICAL DATA:  Transient ischemic attack (TIA) EXAM: MRI HEAD WITHOUT CONTRAST TECHNIQUE: Multiplanar, multiecho pulse sequences of the brain and surrounding structures were obtained without intravenous contrast. COMPARISON:  2018 FINDINGS: Brain: There is no acute infarction or intracranial hemorrhage. There is no intracranial mass, mass effect, or edema. There is no hydrocephalus or extra-axial fluid collection. Ventricles and sulci are within normal limits in size and configuration. Patchy and confluent areas of T2 hyperintensity in the supratentorial white matter nonspecific but may reflect mild to moderate chronic microvascular ischemic changes. Vascular: Major vessel flow voids at the skull base are preserved. Skull and upper cervical spine: Normal marrow signal is preserved. Sinuses/Orbits: Paranasal sinuses are aerated. Left lens replacement. Other: Sella is unremarkable.  Mastoid air cells are clear. IMPRESSION: No acute infarction, hemorrhage, or mass. Mild to moderate chronic microvascular ischemic changes. Electronically Signed   By: Macy Mis M.D.   On: 03/03/2022 12:31   CT HEAD WO CONTRAST (5MM)  Result Date: 03/02/2022 CLINICAL DATA:  Headache, new or worsening (Age >= 50y) EXAM: CT  HEAD WITHOUT CONTRAST TECHNIQUE: Contiguous axial images were obtained from the base of the skull through the vertex without intravenous contrast. RADIATION DOSE REDUCTION: This exam was performed according to the departmental dose-optimization program which includes automated exposure control, adjustment of the mA and/or kV according to patient size and/or use of iterative reconstruction technique. COMPARISON:  None Available. FINDINGS: Brain: Normal anatomic configuration. Parenchymal volume loss is commensurate with the patient's age. Mild periventricular white matter changes are present likely reflecting the sequela of small vessel ischemia. No abnormal intra or extra-axial mass lesion or fluid collection. No abnormal mass effect or midline shift. No evidence of acute intracranial hemorrhage or infarct. Ventricular size is normal. Cerebellum unremarkable. Vascular: No asymmetric hyperdense vasculature at the skull base. Skull: Intact Sinuses/Orbits: Paranasal sinuses are clear. Orbits are unremarkable. Other: Mastoid air cells and middle ear cavities are clear. IMPRESSION: No acute intracranial abnormality. Mild senescent change. Electronically Signed   By: Fidela Salisbury M.D.   On: 03/02/2022 22:56    Microbiology: Results for orders placed or performed during the hospital encounter of 01/29/22  Aerobic/Anaerobic Culture w Gram Stain (surgical/deep wound)     Status: None   Collection Time: 01/29/22  6:03 PM   Specimen: Wound  Result Value Ref Range Status   Specimen Description   Final    WOUND RIGHT LEG Performed at [facility], [address] [place], [place] [address]    Special Requests   Final    NONE Performed  at [facility], [address] [place], [place] [address]    Gram Stain   Final    NO WBC SEEN ABUNDANT GRAM POSITIVE COCCI RARE GRAM NEGATIVE RODS    Culture   Final    ABUNDANT PSEUDOMONAS AERUGINOSA ABUNDANT SERRATIA MARCESCENS MODERATE PROTEUS  MIRABILIS ABUNDANT ENTEROCOCCUS FAECALIS NO ANAEROBES ISOLATED Performed at [facility], [place] [address] [place], [place] [address]    Report Status 02/03/2022 FINAL  Final   Organism ID, Bacteria PSEUDOMONAS AERUGINOSA  Final   Organism ID, Bacteria SERRATIA MARCESCENS  Final   Organism ID, Bacteria PROTEUS MIRABILIS  Final   Organism ID, Bacteria ENTEROCOCCUS FAECALIS  Final      Susceptibility   Enterococcus faecalis - MIC*    AMPICILLIN <=2 SENSITIVE Sensitive     VANCOMYCIN 1 SENSITIVE Sensitive     GENTAMICIN SYNERGY SENSITIVE Sensitive     * ABUNDANT ENTEROCOCCUS FAECALIS   Pseudomonas aeruginosa - MIC*    CEFTAZIDIME 8 SENSITIVE Sensitive     CIPROFLOXACIN <=0.25 SENSITIVE Sensitive     GENTAMICIN <=1 SENSITIVE Sensitive     IMIPENEM 0.5 SENSITIVE Sensitive     PIP/TAZO <=4 SENSITIVE Sensitive     CEFEPIME 4 SENSITIVE Sensitive     * ABUNDANT PSEUDOMONAS AERUGINOSA   Proteus mirabilis - MIC*    AMPICILLIN <=2 SENSITIVE Sensitive     CEFAZOLIN <=4 SENSITIVE Sensitive     CEFEPIME <=0.12 SENSITIVE Sensitive     CEFTAZIDIME <=1 SENSITIVE Sensitive     CEFTRIAXONE <=0.25 SENSITIVE Sensitive     CIPROFLOXACIN <=0.25 SENSITIVE Sensitive     GENTAMICIN <=1 SENSITIVE Sensitive     IMIPENEM 2 SENSITIVE Sensitive     TRIMETH/SULFA <=20 SENSITIVE Sensitive     AMPICILLIN/SULBACTAM <=2 SENSITIVE Sensitive     PIP/TAZO <=4 SENSITIVE Sensitive     * MODERATE PROTEUS MIRABILIS   Serratia marcescens - MIC*    CEFAZOLIN >=64 RESISTANT Resistant     CEFEPIME <=0.12 SENSITIVE Sensitive     CEFTAZIDIME <=1 SENSITIVE Sensitive     CEFTRIAXONE <=0.25 SENSITIVE Sensitive     CIPROFLOXACIN <=0.25 SENSITIVE Sensitive     GENTAMICIN <=1 SENSITIVE Sensitive     TRIMETH/SULFA <=20 SENSITIVE Sensitive     * ABUNDANT SERRATIA MARCESCENS    Labs: CBC: Recent Labs  Lab 03/02/22 2215 03/03/22 0801 03/04/22 0434 03/05/22 0324  WBC 7.8 7.0  --  6.7  NEUTROABS  --  3.9  --   --    HGB 10.7* 10.1* 9.9* 10.3*  HCT 35.1* 33.6*  --  32.8*  MCV 87.1 88.7  --  86.3  PLT 327 271  --  505    Basic Metabolic Panel: Recent Labs  Lab 03/02/22 2215 03/03/22 0801 03/04/22 0434 03/05/22 0324  NA 141 138 139 138  K 5.0 4.9 4.4 4.4  CL 108 106 109 107  CO2 '27 24 27 27  '$ GLUCOSE 117* 96 85 89  BUN 35* 32* 26* 22  CREATININE 2.07* 1.90* 1.33* 1.21*  CALCIUM 9.6 8.7* 8.7* 8.7*    Liver Function Tests: Recent Labs  Lab 03/02/22 2215 03/03/22 0801  AST 18 17  ALT 18 17  ALKPHOS 62 55  BILITOT 0.5 0.5  PROT 7.9 6.7  ALBUMIN 3.6 2.9*     Confirmed with dter bed was delivered and will go home today Signed: Fritzi Mandes, MD [facility] 03/07/2022

## 2022-03-07 NOTE — Plan of Care (Signed)

## 2022-03-10 ENCOUNTER — Encounter: Payer: Medicare Other | Attending: Physician Assistant | Admitting: Physician Assistant

## 2022-03-10 DIAGNOSIS — L97812 Non-pressure chronic ulcer of other part of right lower leg with fat layer exposed: Secondary | ICD-10-CM | POA: Diagnosis not present

## 2022-03-10 DIAGNOSIS — I509 Heart failure, unspecified: Secondary | ICD-10-CM | POA: Diagnosis not present

## 2022-03-10 DIAGNOSIS — I872 Venous insufficiency (chronic) (peripheral): Secondary | ICD-10-CM | POA: Insufficient documentation

## 2022-03-10 DIAGNOSIS — I890 Lymphedema, not elsewhere classified: Secondary | ICD-10-CM | POA: Insufficient documentation

## 2022-03-10 DIAGNOSIS — I110 Hypertensive heart disease with heart failure: Secondary | ICD-10-CM | POA: Insufficient documentation

## 2022-03-10 NOTE — Progress Notes (Signed)
SHERRYE, PUGA ([id number]) Visit Report for 03/10/2022 Problem List Details Patient Name: [name], [name] Date of Service: 03/10/2022 3:00 PM Medical Record Number: [id number] Patient Account Number: [id number] Date of Birth/Sex: 12-15-1945 (76 y.o. 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

## 2022-03-18 ENCOUNTER — Encounter: Payer: Medicare Other | Admitting: Physician Assistant

## 2022-03-18 DIAGNOSIS — I872 Venous insufficiency (chronic) (peripheral): Secondary | ICD-10-CM | POA: Diagnosis not present

## 2022-03-18 NOTE — Progress Notes (Addendum)
[name], [name] ([id number]) Visit Report for 03/18/2022 Chief Complaint Document Details Patient Name: [name], [name] Date of Service: 03/18/2022 2:00 PM Medical Record Number: [id number] Patient Account Number: [id number] Date of Birth/Sex: July 07, 1946 (76 y.o. F) Treating RN: Carlene Coria Primary Care Provider: [name] Other Clinician: Referring Provider: [name] Treating Provider/Extender: Skipper Cliche in Treatment: 60 Information Obtained from: Patient Chief Complaint Bilateral LE Ulcers Electronic Signature(s) Signed: 03/18/2022 2:40:50 PM By: Worthy Keeler PA-C Entered By: Worthy Keeler on 03/18/2022 14:40:50 [name] ([id number]) -------------------------------------------------------------------------------- HPI Details Patient Name: [name] Date of Service: 03/18/2022 2:00 PM Medical Record Number: [id number] Patient Account Number: [id number] Date of Birth/Sex: [birth year] (76 y.o. F) Treating RN: Carlene Coria Primary Care Provider: [name] Other Clinician: Referring Provider: [name] Treating Provider/Extender: Skipper Cliche in Treatment: 60 History of Present Illness HPI Description: 03/15/2020 upon evaluation today patient presents today for initial evaluation here in our clinic concerning a wound that is on the left posterior lower extremity. Unfortunately this has been giving the patient some discomfort at this point which she notes has been affected her pretty much daily. The patient does have a history of venous insufficiency, hypertension, congestive heart failure, and a history of breast cancer. Fortunately there does not appear to be evidence of active infection at this time which is great news. No fevers, chills, nausea, vomiting, or diarrhea. Wound is not extremely large but does have some slough covering the surface of the wound. This is going require sharp debridement today. 03/15/2020 upon evaluation today patient  appears to be doing fairly well in regard to her wound. She does have some slough noted on the surface of the wound currently but I do believe the Iodoflex has been beneficial over the past week. There is no signs of active infection at this time which is great news and overall very pleased with the progress. No fevers, chills, nausea, vomiting, or diarrhea. 04/02/2020 upon evaluation today patient appears to be doing a little better in regard to her wound size wise this is not tremendously smaller she does have some slough buildup on the surface of the wound. With that being said this is going require some sharp debridement today to clear away some of the necrotic debris. Fortunately there is no evidence of active infection at this time. No fevers, chills, nausea, vomiting, or diarrhea. 04/10/2020 on evaluation today patient appears to be doing well with regard to her ulcer which is measuring somewhat smaller today. She actually has more granulation tissue noted which is also good news is no need for sharp debridement today. Fortunately there is no evidence of infection either which is also excellent. No fevers, chills, nausea, vomiting, or diarrhea. 04/26/2020 on evaluation today patient's wound actually is showing signs of good improvement which is great news. There does not appear to be any evidence of active infection. I do believe she is tolerating the collagen at this point. 05/03/2020 upon evaluation today patient's wound actually showed signs of good granulation at this time there does not appear to be any evidence of active infection which is great news and overall very pleased with where things stand. With that being said I do believe that the patient is can require some sharp debridement today but fortunately nothing too significant. 05/11/20 on evaluation today patient appears to be doing well in regard to her leg ulcer. She's been tolerating the dressing changes without complication.  Fortunately there is no signs of active infection at this time.  Overall I'm very pleased with where things stand. 05/18/2020 upon evaluation today patient's wound is showing signs of improvement is very dry and I think the collagen for the most part just dried to the wound bed. I am going to clean this away with sharp debridement in order to get this under control in my opinion. 05/25/2020 upon evaluation today patient actually appears to be doing well in regard to her leg ulcer. In fact upon inspection it appears she could potentially be completely healed but that is not guaranteed based on what we are seeing. There does not appear to be any signs of active infection which is great news. 05/31/2020 on evaluation today patient appears to be doing well with regard to her wounds. She in fact appears to be almost completely healed on the left leg there is just a very small opening remaining point 1 06/08/2020 upon evaluation today patient appears to be doing excellent in regard to her leg ulcer on the left in fact it appears to be that she is completely healed as of today. I see no signs of active infection at this time which is great news. No fevers, chills, nausea, vomiting, or diarrhea. READMISSION 01/16/2021 Patient that we have had in this clinic with a wound on the left posterior calf in the summer 2021 extending into October. She was felt to have chronic venous insufficiency. Per the patient's description she was discharged with what sounds like external compression stockings although she could not afford the "$75 per leg". She therefore did not get anything. Apparently her wound that she has currently is been in existence since January. She has been followed at vein and vascular with Unna boots and I think calcium alginate. She had ABIs done on 06/25/2020 that showed an noncompressible ABI on the right leg and the left leg but triphasic waveforms with good great toe pressures and a TBI of 0.90 on the  right and 0.91 on the left Surprisingly the patient also had venous reflux studies that were really unremarkable. This included no evidence of DVTs or SVTs but there was no evidence of deep venous insufficiency or superficial venous insufficiency in the greater saphenous or short saphenous veins bilaterally. I would wonder about more central venous issues or perhaps this is all lymphedema 01/25/2021 upon evaluation today patient appears to be doing decently well and guard to her wound. The good news is the Iodoflex that is job she saw Dr. Dellia Nims last week for readmission and to be honest I think that this has done extremely well over that week. I do believe that she can tolerate the sharp debridement today which will be good. I think that cleaning off the wound will likely allow [place] to be able to use a different type of dressing I do not think the Iodoflex will even be necessary based on how clean the wound looks. Fortunately there is no sign of active infection at this time which is great news. No fevers, chills, nausea, vomiting, or diarrhea. 02/12/2021 upon evaluation today patient appears to be doing well with regard to her leg ulcer. We did switch to alginate when she came for nurse visit I think is doing much better for her. Fortunately there does not appear to be any signs of active infection at this time which is great news. No fevers, chills, nausea, vomiting, or diarrhea. ILEEN, KAHRE ([id number]) 02/18/2021 upon evaluation today patient's wound is actually showing signs of good improvement. I am very pleased with where things stand  currently. I do not see any signs of active infection which is great news and overall I feel like she is making good progress. The patient likewise is happy that things are doing so well and that this is measuring somewhat smaller. 02/25/2021 upon evaluation today patient actually appears to be doing quite well in regard to her wounds. She has been tolerating the  dressing changes without complication. Fortunately there does not appear to be any signs of active infection which is great news and overall I am extremely pleased with where things stand. No fevers, chills, nausea, vomiting, or diarrhea. She does have a lot of edema I really do think she needs compression socks to be worn in order to prevent things from causing additional issues for her currently. Especially on the right leg she should be wearing compression socks all the time to be honest. 03/04/2021 upon evaluation today patient's leg ulcer actually appears to be doing pretty well which is great news. There does not appear to be any significant change but overall the appearance is better even though the size is not necessarily reflecting a great improvement. Fortunately there does not appear to be any signs of active infection. No fevers, chills, nausea, vomiting, or diarrhea. 03/12/2021 upon evaluation today patient appears to be doing well with regard to her wounds. She has been tolerating the dressing changes without complication. The good news is that the wound on her left lateral leg is doing much better and is showing signs of good improvement. Overall her swelling is controlled well as well. She does need some compression socks she plans to go Jefferey socks next week. 03/18/2021 on evaluation today patient's wound actually is showing signs of good improvement. She does have a little bit of slough this can require some sharp debridement today. 03/25/2021 upon evaluation today patient appears to be doing well with regard to her wound on the left lateral leg. She has been tolerating the dressing changes without complication. Fortunately there does not appear to be any signs of active infection at this time. No fever chills no 04/01/2021 upon evaluation today patient's wound is showing signs of improvement and overall very pleased with where things stand at this point. There is no evidence of active  infection which is great news as well and in general I think that she is making great progress. 04/09/2021 upon evaluation today patient appears to be doing well with regard to her ankle ulcer. She is making good progress currently which is great news. There does not appear to be any signs of infection also excellent news. In general I am extremely pleased with where things stand today. No fevers, chills, nausea, vomiting, or diarrhea. 04/23/2021 upon inspection today patient appears to be doing quite well with regard to her leg ulcer. She has been tolerating the dressing changes without complication. She is can require some sharp debridement today but overall seems to be doing excellent. 05/06/2021 upon evaluation today patient unfortunately appears to be doing poorly overall in regard to her swelling. She is actually significantly more swollen than last week despite the fact that her wound is doing better this is not a good trend. I think that she probably needs to see her cardiologist ASAP and I discussed that with her today. This includes the fact that honestly I think she probably is getting need an increase in her diuretic or something to try to clear away some of the excess fluid that she is experiencing at the moment. She is  not been doing her pumps even twice a day but again right now more concerned that if she were to do the pump she would fluid overload her lungs causing other issues as far as her breathing is concerned. Obviously I think she needs to contact them and move up the appointment for the 12th of something sooner 2 weeks is a bit too long to wait with how swollen she has. 05/14/2021 upon evaluation today patient's wound actually showing signs of being a little bit smaller compared to previous. She has been making good progress however. Fortunately there does not appear to be any evidence of active infection which is great. Overall I am extremely pleased in that regard. Nonetheless I am  still concerned that she is having quite a bit of issue as far as the swelling is concerned she has been placed on fluid restriction as well as an increase in the furosemide by her doctor. We will see how things progress. 05/21/2021 upon evaluation today patient appears to be doing well with regard to her wound in general. She has been tolerating the dressing changes without complication. With that being said she has been using silver cell for a while and while this was showing signs of improvement it is now gotten to the point where this has slowed down quite significantly. I do believe that switching to a different dressing may be beneficial for her today. 05/28/2021 upon evaluation today patient actually appears to be doing quite well in regard to her wound. In fact there is really not any need for sharp debridement today based on what I see. The Hydrofera Blue is doing great and overall I think that she is managing quite nicely with the compression wrapping. Her edema is still down quite a bit with the wrapping in place. 06/11/2021 upon evaluation today patient appears to be doing well with regard to her leg ulcer. She has been tolerating the dressing changes without complication. Fortunately there does not appear to be any signs of active infection at this time which is great news. No fevers, chills, nausea, vomiting, or diarrhea. She is going require some sharp debridement today. 10/11; left leg ulcer much better looking and with improved surface area. She is using Hydrofera Blue under 3 layer compression. She does not have any stocking on the right leg which in itself has very significant nonpitting edema 06/25/2021 upon evaluation today patient appears to be doing well with regard to the wound on her left leg. She has been tolerating the dressing changes without complication and overall I am extremely pleased with where things stand today. I do not see any evidence of infection which is great  news. 07/02/2021 upon evaluation today patient's wound actually showing signs of excellent improvement and actually very pleased with where we stand today and the overall appearance. Fortunately there does not appear to be any signs of active infection. No fevers, chills, nausea, vomiting, or diarrhea. 07/09/2021 upon evaluation today patient appears to be doing better in regard to her wound this is measuring smaller and she is headed in the appropriate direction based on what I am seeing currently. I do not see any evidence of active infection systemically which is great news. 07/16/2021 upon evaluation today patient appears to be doing pretty well currently in regard to her leg ulcer. This is measuring smaller and looking much better this is great news. Fortunately there does not appear to be any evidence of active infection systemically which is great news as well.  07/23/2021 upon evaluation today patient's wound is actually showing signs of good improvement this is definitely measuring smaller. I do not see any signs of infection which is great news and overall very pleased in that regard. Overall I think that she is doing well with the [place], [place] ([id number]) [place]. 07/30/2020 upon evaluation today patient appears to be doing well in regard to her wound. She has been tolerating the dressing changes without complication think the Hydrofera Blue at this point is actually causing her to dry out a lot as far as the wound bed is concerned. Subsequently I think that we need to try to see what we can do to improve this. Fortunately there does not appear to be any evidence of active infection at this time which is great news. 08/06/2021 upon evaluation today patient appears to be doing well with regards to her wound. Is not measuring significantly smaller initial inspection but upon closer inspection she has a lot of new skin growth across the central portion of the wound. I think will  get very close to complete resolution. 08/13/2021 upon evaluation today patient appears to be doing well with regard to her wound. In fact this appears to be I believe healed although there is still some question as to whether it is completely so. I am concerned about the fact that to be honest she still has some evidence of dry skin that could be just trapping something underneath I still want to debride anything as I am afraid of causing some damage to the good skin for that reason I Georgina Peer probably monitor for 1 more week before completely closing everything out. 08/20/2021 upon evaluation today patient actually appears to be doing excellent. In fact she appears to be completely healed. This was the case last week as well we just wanted to make sure nothing reopened since she does not really have an ability to put on compression socks this is good to be something we need to make sure was well-healed. Nonetheless I think we have achieved that goal as of today. 12/27; this is a patient we discharged 2 weeks ago with wounds on her left lower leg laterally. She was supposed to get stockings and really did not get them. She developed blistering and reopening probably sometime late last week. She has 3 areas in the same area as previously. The mid calf dimensions on the left went from 39 to 51 cm today. She also has very significant edema on the right leg but as far as I am aware has not had wounds in this area 1/4; patient presents for follow-up. She has tolerated the compression wrap well. She has no issues or complaints today. 09/17/2021 upon evaluation today patient appears to be doing well with regard to her wounds. She has been tolerating the dressing changes without complication. Fortunately there does not appear to be any signs of active infection at this time. No fevers, chills, nausea, vomiting, or diarrhea. 10/01/2021 upon evaluation today patient appears to be doing well with regard to her  wound this is actually measuring better and looking smaller and I am very pleased in that regard. Fortunately I do not see any evidence of active infection locally nor systemically at this point. No fevers, chills, nausea, vomiting, or diarrhea. 10/08/2021 upon evaluation patient's wounds are actually showing some signs of improvement measuring a little bit smaller and looking a little bit better. Overall I do not think there is any need for sharp debridement today which  is good news. No fevers, chills, nausea, vomiting, or diarrhea. 10/14/2021 upon evaluation today patient appears to be doing well with regard to her wound. She has been tolerating the dressing changes without complication and overall I am extremely pleased with where things stand today. There is a little bit of film on the surface of the wounds but this was carefully cleaned away with just saline and gauze she really did not want me to do any sharp debridement today. 10/29/2021 upon evaluation today patient appears to be doing quite well in regard to her wounds. She is actually showing signs of excellent improvement which is great news and overall I feel like we are on the right track at this time. She does have a wound on both legs and she does need something compression wise when she heals for that reason we will get a go and see about ordering her bilateral juxta lite compression wraps. 11/05/2021 upon evaluation today patient appears to be doing well with regard to her wound. She has been tolerating the dressing changes without complication. Fortunately I do not see any evidence of active infection locally nor systemically at this time which is great news. No fevers, chills, nausea, vomiting, or diarrhea. 11/12/2021 upon evaluation today patient appears to be doing well with regard to her wounds. She is doing excellent on the left on the right this is not doing quite as well there is some slough and biofilm buildup. 3/14; patient  presents for follow-up. She has no issues or complaints today. She has tolerated the compression wraps well. 11/26/2021 upon evaluation today patient appears to be doing well with regard to her wounds. Both are showing signs of good improvement which is great news. I do not see any evidence of active infection and overall think she is healing quite nicely. 12/03/2021 upon evaluation today patient appears to be doing well with regard to her wounds. Both are showing signs of excellent improvement I am very pleased with where things stand and I think that she is making good progress here. Fortunately I do not see any signs of active infection locally or systemically which is great news. No fevers, chills, nausea, vomiting, or diarrhea. 12-10-2021 upon evaluation today patient's wounds actually appear to be doing awesome. I am extremely pleased with where we stand I think she is making excellent progress. I do not see any signs of active infection locally or systemically at this time. 12-17-2021 upon evaluation today patient's wounds are actually showing signs of good improvement. Fortunately I do not see any evidence of active infection locally nor systemically which is great news. Overall I do not believe that she is showing any signs of significant worsening which is good news. With that being said I do believe that she has a little bit of film on the surface of the wound I was actually able to clear this away with saline and gauze no sharp debridement was necessary. 12-24-2021 upon evaluation today patient appears to be doing well with regard to her wounds. I do not see any signs of active infection currently which is great news and overall I think that we are on the right track here. No fevers, chills, nausea, vomiting, or diarrhea. 12-31-2021 upon evaluation today patient appears to be doing well with regard to her wounds. Both are showing signs of improvement the wound on the right leg is going require  some sharp debridement on the left seems to be doing quite well. 01-07-2022 upon evaluation today patient  appears to be doing well currently in regard to her wounds. She is actually making good progress bilaterally. Both of them require little bit of sharp debridement but not too much which is great news. [place], Adiya ([id number]) Upon inspection patient's wound bed actually showed signs of good granulation and epithelization on both legs. She still is having significant issues with the right leg compared to the left although I do feel like both are actually showing signs of pretty good improvement. 01-21-2022 upon evaluation today patient's wound is actually showing signs of excellent improvement I am very pleased with where we stand currently. I do not see any signs of active infection locally or systemically which is great news. No fevers, chills, nausea, vomiting, or diarrhea. 01-28-2022 upon evaluation today patient appears to be doing well with regard to her wound on the left leg this is pretty much just about healed which is great news. Unfortunately in regard to the wound on her right leg this is not doing nearly as well in fact I think we probably need to see about a culture today that was discussed with her. 02-04-2022 upon evaluation today patient appears to be doing well with regard to her left leg which in fact might actually be completely healed I am not 100% sure. Nonetheless this is shown signs of excellent improvement which is great news. With that being said in regards to the right leg there is some slough and film buildup on the surface of the wound although she is feeling much better than last week this does appear to be doing much better than it was last week. Fortunately there does not appear to be any signs of active infection locally or systemically at this time. 02-11-2022 upon evaluation today patient's wounds appear to be doing decently well. In fact the left leg is healed  although there is brand-new skin were definitely given still need to wrap her for the time being. On the right leg this is showing signs of some epithelial growth in the middle of the wound it still measures the same because there is still a larger area with speckled openings but again as far as the entire area being open this is significantly improved which is great news. I am very pleased. 6/13; left leg remains healed and she has a juxta light to apply to it today. There is some concern about whether she is going to be able to do this at home. She lives with a daughter although she has her own disability. On the right the wound looks smaller to me nice epithelialization good edema control. 02-25-2022 upon evaluation today patient appears to be doing well with regard to her legs the left leg is still healed the right leg is doing much better. I think we are on the right track here. With that being said unfortunately she continues to have significant issues with some other problems and she actually tells me on Saturday she was having lunch and went inside to sit down when she tells me she had all of a sudden a sharp sensation that went from her hand through her arm into her leg and her neck and she tells me that she could not see anything for the next hour it was completely black. Slowly after this her vision started to return and has been normal since. It sounds to me as if she may have had a TIA or mini stroke at this point. Nonetheless I think that she needs to have this  checked out ASAP. Her primary care provider is Dr. Clide Deutscher at [facility]. We will get a try to get in touch with him. 03-10-2022 upon evaluation today patient's wound actually showed signs of good improvement. She seems to be making excellent progress here. I do not see any evidence of active infection locally or systemically which is great news. No fevers, chills, nausea, vomiting, or diarrhea. She was in the hospital since have  seen her last she ended up finding out that she was having syncopal episodes. That is what was going on with the blacking out and losing vision. Subsequently they adjusted some of her medication she seems to be doing better. 03-18-2022 upon evaluation today patient's wound is actually showing signs of good granulation and epithelization at this point. Fortunately I do not see any signs of active infection locally or systemically which is great news. Electronic Signature(s) Signed: 03/18/2022 5:12:08 PM By: Worthy Keeler PA-C Entered By: Worthy Keeler on 03/18/2022 17:12:08 ADDA, STOKES ([id number]) -------------------------------------------------------------------------------- Physical Exam Details Patient Name: [name] Date of Service: 03/18/2022 2:00 PM Medical Record Number: [id number] Patient Account Number: [id number] Date of Birth/Sex: 07/05/1946 (76 y.o. F) Treating RN: Carlene Coria Primary Care Provider: [name] Other Clinician: Referring Provider: [name] Treating Provider/Extender: Skipper Cliche in Treatment: 17 Constitutional Well-nourished and well-hydrated in no acute distress. Respiratory normal breathing without difficulty. Psychiatric this patient is able to make decisions and demonstrates good insight into disease process. Alert and Oriented x 3. pleasant and cooperative. Notes Upon inspection patient's wound bed actually showed signs of some slough and biofilm I then performed debridement to clear some of this away she tolerated this without complication postdebridement the wound is doing much better. Electronic Signature(s) Signed: 03/18/2022 5:12:21 PM By: Worthy Keeler PA-C Entered By: Worthy Keeler on 03/18/2022 17:12:20 [name] ([id number]) -------------------------------------------------------------------------------- Physician Orders Details Patient Name: [name] Date of Service: 03/18/2022 2:00 PM Medical  Record Number: [id number] Patient Account Number: [id number] Date of Birth/Sex: 04-Jul-1946 (76 y.o. F) Treating RN: Carlene Coria Primary Care Provider: [name] Other Clinician: Referring Provider: [name] Treating Provider/Extender: Skipper Cliche in Treatment: 50 Verbal / Phone Orders: No Diagnosis Coding Follow-up Appointments o Return Appointment in 1 week. o Nurse Visit as needed Bathing/ Shower/ Hygiene o May shower with wound dressing protected with water repellent cover or cast protector. o No tub bath. Anesthetic (Use 'Patient Medications' Section for Anesthetic Order Entry) o Lidocaine applied to wound bed Edema Control - Lymphedema / Segmental Compressive Device / Other Bilateral Lower Extremities o Optional: One layer of unna paste to top of compression wrap (to act as an anchor). - she needs this o Patient to wear own Velcro compression garment. Remove compression stockings every night before going to bed and put on every morning when getting up. - left leg o Elevate, Exercise Daily and Avoid Standing for Long Periods of Time. o Elevate legs to the level of the heart and pump ankles as often as possible o Elevate leg(s) parallel to the floor when sitting. o Compression Pump: Use compression pump on left lower extremity for 60 minutes, twice daily. - You may pump over wraps o Compression Pump: Use compression pump on right lower extremity for 60 minutes, twice daily. - You may pump over wraps Additional Orders / Instructions o Follow Nutritious Diet and Increase Protein Intake Wound Treatment Wound #4 - Lower Leg Wound Laterality: Right, Lateral Cleanser: Soap and Water 1 x Per Week/30 Days  Discharge Instructions: Gently cleanse wound with antibacterial soap, rinse and pat dry prior to dressing wounds Cleanser: Wound Cleanser 1 x Per Week/30 Days Discharge Instructions: Wash your hands with soap and water. Remove old dressing, discard  into plastic bag and place into trash. Cleanse the wound with Wound Cleanser prior to applying a clean dressing using gauze sponges, not tissues or cotton balls. Do not scrub or use excessive force. Pat dry using gauze sponges, not tissue or cotton balls. Primary Dressing: Silvercel Small 2x2 (in/in) 1 x Per Week/30 Days Discharge Instructions: Apply Silvercel Small 2x2 (in/in) as instructed Secondary Dressing: ABD Pad 5x9 (in/in) 1 x Per Week/30 Days Discharge Instructions: Cover with ABD pad Compression Wrap: 3-LAYER WRAP - Profore Lite LF 3 Multilayer Compression Bandaging System 1 x Per Week/30 Days Discharge Instructions: Apply 3 multi-layer wrap as prescribed. Compression Stockings: Circaid Juxta Lite Compression Wrap Right Leg Compression Amount: 20-30 mmHG Discharge Instructions: Apply Circaid Juxta Lite Compression Wrap as directed Electronic Signature(s) Signed: 03/20/2022 4:35:29 PM By: Worthy Keeler PA-C Signed: 03/21/2022 10:45:13 AM By: Carlene Coria RN Entered By: Carlene Coria on 03/18/2022 14:43:37 [name] ([id number]) -------------------------------------------------------------------------------- Problem List Details Patient Name: [name] Date of Service: 03/18/2022 2:00 PM Medical Record Number: [id number] Patient Account Number: [id number] Date of Birth/Sex: [birth year] (76 y.o. 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) with ulcer and inflammation of 01/16/2021 No Yes left lower extremity L97.812 Non-pressure chronic ulcer of other part of right lower leg with fat layer 01/07/2022 No Yes exposed Inactive Problems ICD-10 Code  Description Active Date Inactive Date L97.822 Non-pressure chronic ulcer of other part of left lower leg with fat layer exposed 01/16/2021 01/16/2021 Resolved Problems Electronic Signature(s) Signed: 03/18/2022 2:40:46 PM By: Worthy Keeler PA-C Entered By: Worthy Keeler on 03/18/2022 14:40:46 [name] ([id number]) -------------------------------------------------------------------------------- Progress Note Details Patient Name: [name] Date of Service: 03/18/2022 2:00 PM Medical Record Number: [id number] Patient Account Number: [id number] Date of Birth/Sex: 12/08/1945 (76 y.o. F) Treating RN: Carlene Coria Primary Care Provider: [name] Other Clinician: Referring Provider: [name] Treating Provider/Extender: Skipper Cliche in Treatment: 60 Subjective Chief Complaint Information obtained from Patient Bilateral LE Ulcers History of Present Illness (HPI) 03/15/2020 upon evaluation today patient presents today for initial evaluation here in our clinic concerning a wound that is on the left posterior lower extremity. Unfortunately this has been giving the patient some discomfort at this point which she notes has been affected her pretty much daily. The patient does have a history of venous insufficiency, hypertension, congestive heart failure, and a history of breast cancer. Fortunately there does not appear to be evidence of active infection at this time which is great news. No fevers, chills, nausea, vomiting, or diarrhea. Wound is not extremely large but does have some slough covering the surface of the wound. This is going require sharp debridement today. 03/15/2020 upon evaluation today patient appears to be doing fairly well in regard to her wound. She does have some slough noted on the surface of the wound currently but I do believe the Iodoflex has been beneficial over the past week. There is no signs of active infection at this time which is great news and  overall very pleased with the progress. No fevers, chills,  nausea, vomiting, or diarrhea. 04/02/2020 upon evaluation today patient appears to be doing a little better in regard to her wound size wise this is not tremendously smaller she does have some slough buildup on the surface of the wound. With that being said this is going require some sharp debridement today to clear away some of the necrotic debris. Fortunately there is no evidence of active infection at this time. No fevers, chills, nausea, vomiting, or diarrhea. 04/10/2020 on evaluation today patient appears to be doing well with regard to her ulcer which is measuring somewhat smaller today. She actually has more granulation tissue noted which is also good news is no need for sharp debridement today. Fortunately there is no evidence of infection either which is also excellent. No fevers, chills, nausea, vomiting, or diarrhea. 04/26/2020 on evaluation today patient's wound actually is showing signs of good improvement which is great news. There does not appear to be any evidence of active infection. I do believe she is tolerating the collagen at this point. 05/03/2020 upon evaluation today patient's wound actually showed signs of good granulation at this time there does not appear to be any evidence of active infection which is great news and overall very pleased with where things stand. With that being said I do believe that the patient is can require some sharp debridement today but fortunately nothing too significant. 05/11/20 on evaluation today patient appears to be doing well in regard to her leg ulcer. She's been tolerating the dressing changes without complication. Fortunately there is no signs of active infection at this time. Overall I'm very pleased with where things stand. 05/18/2020 upon evaluation today patient's wound is showing signs of improvement is very dry and I think the collagen for the most part just dried to the wound bed. I  am going to clean this away with sharp debridement in order to get this under control in my opinion. 05/25/2020 upon evaluation today patient actually appears to be doing well in regard to her leg ulcer. In fact upon inspection it appears she could potentially be completely healed but that is not guaranteed based on what we are seeing. There does not appear to be any signs of active infection which is great news. 05/31/2020 on evaluation today patient appears to be doing well with regard to her wounds. She in fact appears to be almost completely healed on the left leg there is just a very small opening remaining point 1 06/08/2020 upon evaluation today patient appears to be doing excellent in regard to her leg ulcer on the left in fact it appears to be that she is completely healed as of today. I see no signs of active infection at this time which is great news. No fevers, chills, nausea, vomiting, or diarrhea. READMISSION 01/16/2021 Patient that we have had in this clinic with a wound on the left posterior calf in the summer 2021 extending into October. She was felt to have chronic venous insufficiency. Per the patient's description she was discharged with what sounds like external compression stockings although she could not afford the "$75 per leg". She therefore did not get anything. Apparently her wound that she has currently is been in existence since January. She has been followed at vein and vascular with Unna boots and I think calcium alginate. She had ABIs done on 06/25/2020 that showed an noncompressible ABI on the right leg and the left leg but triphasic waveforms with good great toe pressures and a TBI of 0.90 on  the right and 0.91 on the left Surprisingly the patient also had venous reflux studies that were really unremarkable. This included no evidence of DVTs or SVTs but there was no evidence of deep venous insufficiency or superficial venous insufficiency in the greater saphenous or  short saphenous veins bilaterally. I would wonder about more central venous issues or perhaps this is all lymphedema 01/25/2021 upon evaluation today patient appears to be doing decently well and guard to her wound. The good news is the Iodoflex that is job she saw Dr. Dellia Nims last week for readmission and to be honest I think that this has done extremely well over that week. I do believe that she can tolerate the sharp debridement today which will be good. I think that cleaning off the wound will likely allow [place] to be able to use a different type of dressing I do not think the Iodoflex will even be necessary based on how clean the wound looks. Fortunately there is no sign of active infection at this time which is great news. No fevers, chills, nausea, vomiting, or diarrhea. ELESHA, THEDFORD ([id number]) 02/12/2021 upon evaluation today patient appears to be doing well with regard to her leg ulcer. We did switch to alginate when she came for nurse visit I think is doing much better for her. Fortunately there does not appear to be any signs of active infection at this time which is great news. No fevers, chills, nausea, vomiting, or diarrhea. 02/18/2021 upon evaluation today patient's wound is actually showing signs of good improvement. I am very pleased with where things stand currently. I do not see any signs of active infection which is great news and overall I feel like she is making good progress. The patient likewise is happy that things are doing so well and that this is measuring somewhat smaller. 02/25/2021 upon evaluation today patient actually appears to be doing quite well in regard to her wounds. She has been tolerating the dressing changes without complication. Fortunately there does not appear to be any signs of active infection which is great news and overall I am extremely pleased with where things stand. No fevers, chills, nausea, vomiting, or diarrhea. She does have a lot of edema I  really do think she needs compression socks to be worn in order to prevent things from causing additional issues for her currently. Especially on the right leg she should be wearing compression socks all the time to be honest. 03/04/2021 upon evaluation today patient's leg ulcer actually appears to be doing pretty well which is great news. There does not appear to be any significant change but overall the appearance is better even though the size is not necessarily reflecting a great improvement. Fortunately there does not appear to be any signs of active infection. No fevers, chills, nausea, vomiting, or diarrhea. 03/12/2021 upon evaluation today patient appears to be doing well with regard to her wounds. She has been tolerating the dressing changes without complication. The good news is that the wound on her left lateral leg is doing much better and is showing signs of good improvement. Overall her swelling is controlled well as well. She does need some compression socks she plans to go Jefferey socks next week. 03/18/2021 on evaluation today patient's wound actually is showing signs of good improvement. She does have a little bit of slough this can require some sharp debridement today. 03/25/2021 upon evaluation today patient appears to be doing well with regard to her wound on the left lateral  leg. She has been tolerating the dressing changes without complication. Fortunately there does not appear to be any signs of active infection at this time. No fever chills no 04/01/2021 upon evaluation today patient's wound is showing signs of improvement and overall very pleased with where things stand at this point. There is no evidence of active infection which is great news as well and in general I think that she is making great progress. 04/09/2021 upon evaluation today patient appears to be doing well with regard to her ankle ulcer. She is making good progress currently which is great news. There does not  appear to be any signs of infection also excellent news. In general I am extremely pleased with where things stand today. No fevers, chills, nausea, vomiting, or diarrhea. 04/23/2021 upon inspection today patient appears to be doing quite well with regard to her leg ulcer. She has been tolerating the dressing changes without complication. She is can require some sharp debridement today but overall seems to be doing excellent. 05/06/2021 upon evaluation today patient unfortunately appears to be doing poorly overall in regard to her swelling. She is actually significantly more swollen than last week despite the fact that her wound is doing better this is not a good trend. I think that she probably needs to see her cardiologist ASAP and I discussed that with her today. This includes the fact that honestly I think she probably is getting need an increase in her diuretic or something to try to clear away some of the excess fluid that she is experiencing at the moment. She is not been doing her pumps even twice a day but again right now more concerned that if she were to do the pump she would fluid overload her lungs causing other issues as far as her breathing is concerned. Obviously I think she needs to contact them and move up the appointment for the 12th of something sooner 2 weeks is a bit too long to wait with how swollen she has. 05/14/2021 upon evaluation today patient's wound actually showing signs of being a little bit smaller compared to previous. She has been making good progress however. Fortunately there does not appear to be any evidence of active infection which is great. Overall I am extremely pleased in that regard. Nonetheless I am still concerned that she is having quite a bit of issue as far as the swelling is concerned she has been placed on fluid restriction as well as an increase in the furosemide by her doctor. We will see how things progress. 05/21/2021 upon evaluation today patient  appears to be doing well with regard to her wound in general. She has been tolerating the dressing changes without complication. With that being said she has been using silver cell for a while and while this was showing signs of improvement it is now gotten to the point where this has slowed down quite significantly. I do believe that switching to a different dressing may be beneficial for her today. 05/28/2021 upon evaluation today patient actually appears to be doing quite well in regard to her wound. In fact there is really not any need for sharp debridement today based on what I see. The Hydrofera Blue is doing great and overall I think that she is managing quite nicely with the compression wrapping. Her edema is still down quite a bit with the wrapping in place. 06/11/2021 upon evaluation today patient appears to be doing well with regard to her leg ulcer. She has been  tolerating the dressing changes without complication. Fortunately there does not appear to be any signs of active infection at this time which is great news. No fevers, chills, nausea, vomiting, or diarrhea. She is going require some sharp debridement today. 10/11; left leg ulcer much better looking and with improved surface area. She is using Hydrofera Blue under 3 layer compression. She does not have any stocking on the right leg which in itself has very significant nonpitting edema 06/25/2021 upon evaluation today patient appears to be doing well with regard to the wound on her left leg. She has been tolerating the dressing changes without complication and overall I am extremely pleased with where things stand today. I do not see any evidence of infection which is great news. 07/02/2021 upon evaluation today patient's wound actually showing signs of excellent improvement and actually very pleased with where we stand today and the overall appearance. Fortunately there does not appear to be any signs of active infection. No fevers,  chills, nausea, vomiting, or diarrhea. 07/09/2021 upon evaluation today patient appears to be doing better in regard to her wound this is measuring smaller and she is headed in the appropriate direction based on what I am seeing currently. I do not see any evidence of active infection systemically which is great news. 07/16/2021 upon evaluation today patient appears to be doing pretty well currently in regard to her leg ulcer. This is measuring smaller and looking much better this is great news. Fortunately there does not appear to be any evidence of active infection systemically which is great news ANGELIGUE, BOWNE ([id number]) as well. 07/23/2021 upon evaluation today patient's wound is actually showing signs of good improvement this is definitely measuring smaller. I do not see any signs of infection which is great news and overall very pleased in that regard. Overall I think that she is doing well with the [facility]. 07/30/2020 upon evaluation today patient appears to be doing well in regard to her wound. She has been tolerating the dressing changes without complication think the Hydrofera Blue at this point is actually causing her to dry out a lot as far as the wound bed is concerned. Subsequently I think that we need to try to see what we can do to improve this. Fortunately there does not appear to be any evidence of active infection at this time which is great news. 08/06/2021 upon evaluation today patient appears to be doing well with regards to her wound. Is not measuring significantly smaller initial inspection but upon closer inspection she has a lot of new skin growth across the central portion of the wound. I think will get very close to complete resolution. 08/13/2021 upon evaluation today patient appears to be doing well with regard to her wound. In fact this appears to be I believe healed although there is still some question as to whether it is completely so. I am concerned  about the fact that to be honest she still has some evidence of dry skin that could be just trapping something underneath I still want to debride anything as I am afraid of causing some damage to the good skin for that reason I Georgina Peer probably monitor for 1 more week before completely closing everything out. 08/20/2021 upon evaluation today patient actually appears to be doing excellent. In fact she appears to be completely healed. This was the case last week as well we just wanted to make sure nothing reopened since she does not really have an ability to put  on compression socks this is good to be something we need to make sure was well-healed. Nonetheless I think we have achieved that goal as of today. 12/27; this is a patient we discharged 2 weeks ago with wounds on her left lower leg laterally. She was supposed to get stockings and really did not get them. She developed blistering and reopening probably sometime late last week. She has 3 areas in the same area as previously. The mid calf dimensions on the left went from 39 to 51 cm today. She also has very significant edema on the right leg but as far as I am aware has not had wounds in this area 1/4; patient presents for follow-up. She has tolerated the compression wrap well. She has no issues or complaints today. 09/17/2021 upon evaluation today patient appears to be doing well with regard to her wounds. She has been tolerating the dressing changes without complication. Fortunately there does not appear to be any signs of active infection at this time. No fevers, chills, nausea, vomiting, or diarrhea. 10/01/2021 upon evaluation today patient appears to be doing well with regard to her wound this is actually measuring better and looking smaller and I am very pleased in that regard. Fortunately I do not see any evidence of active infection locally nor systemically at this point. No fevers, chills, nausea, vomiting, or diarrhea. 10/08/2021 upon  evaluation patient's wounds are actually showing some signs of improvement measuring a little bit smaller and looking a little bit better. Overall I do not think there is any need for sharp debridement today which is good news. No fevers, chills, nausea, vomiting, or diarrhea. 10/14/2021 upon evaluation today patient appears to be doing well with regard to her wound. She has been tolerating the dressing changes without complication and overall I am extremely pleased with where things stand today. There is a little bit of film on the surface of the wounds but this was carefully cleaned away with just saline and gauze she really did not want me to do any sharp debridement today. 10/29/2021 upon evaluation today patient appears to be doing quite well in regard to her wounds. She is actually showing signs of excellent improvement which is great news and overall I feel like we are on the right track at this time. She does have a wound on both legs and she does need something compression wise when she heals for that reason we will get a go and see about ordering her bilateral juxta lite compression wraps. 11/05/2021 upon evaluation today patient appears to be doing well with regard to her wound. She has been tolerating the dressing changes without complication. Fortunately I do not see any evidence of active infection locally nor systemically at this time which is great news. No fevers, chills, nausea, vomiting, or diarrhea. 11/12/2021 upon evaluation today patient appears to be doing well with regard to her wounds. She is doing excellent on the left on the right this is not doing quite as well there is some slough and biofilm buildup. 3/14; patient presents for follow-up. She has no issues or complaints today. She has tolerated the compression wraps well. 11/26/2021 upon evaluation today patient appears to be doing well with regard to her wounds. Both are showing signs of good improvement which is great news. I  do not see any evidence of active infection and overall think she is healing quite nicely. 12/03/2021 upon evaluation today patient appears to be doing well with regard to her wounds. Both  are showing signs of excellent improvement I am very pleased with where things stand and I think that she is making good progress here. Fortunately I do not see any signs of active infection locally or systemically which is great news. No fevers, chills, nausea, vomiting, or diarrhea. 12-10-2021 upon evaluation today patient's wounds actually appear to be doing awesome. I am extremely pleased with where we stand I think she is making excellent progress. I do not see any signs of active infection locally or systemically at this time. 12-17-2021 upon evaluation today patient's wounds are actually showing signs of good improvement. Fortunately I do not see any evidence of active infection locally nor systemically which is great news. Overall I do not believe that she is showing any signs of significant worsening which is good news. With that being said I do believe that she has a little bit of film on the surface of the wound I was actually able to clear this away with saline and gauze no sharp debridement was necessary. 12-24-2021 upon evaluation today patient appears to be doing well with regard to her wounds. I do not see any signs of active infection currently which is great news and overall I think that we are on the right track here. No fevers, chills, nausea, vomiting, or diarrhea. 12-31-2021 upon evaluation today patient appears to be doing well with regard to her wounds. Both are showing signs of improvement the wound BRANT, Desirey ([id number]) on the right leg is going require some sharp debridement on the left seems to be doing quite well. 01-07-2022 upon evaluation today patient appears to be doing well currently in regard to her wounds. She is actually making good progress bilaterally. Both of them require  little bit of sharp debridement but not too much which is great news. Upon inspection patient's wound bed actually showed signs of good granulation and epithelization on both legs. She still is having significant issues with the right leg compared to the left although I do feel like both are actually showing signs of pretty good improvement. 01-21-2022 upon evaluation today patient's wound is actually showing signs of excellent improvement I am very pleased with where we stand currently. I do not see any signs of active infection locally or systemically which is great news. No fevers, chills, nausea, vomiting, or diarrhea. 01-28-2022 upon evaluation today patient appears to be doing well with regard to her wound on the left leg this is pretty much just about healed which is great news. Unfortunately in regard to the wound on her right leg this is not doing nearly as well in fact I think we probably need to see about a culture today that was discussed with her. 02-04-2022 upon evaluation today patient appears to be doing well with regard to her left leg which in fact might actually be completely healed I am not 100% sure. Nonetheless this is shown signs of excellent improvement which is great news. With that being said in regards to the right leg there is some slough and film buildup on the surface of the wound although she is feeling much better than last week this does appear to be doing much better than it was last week. Fortunately there does not appear to be any signs of active infection locally or systemically at this time. 02-11-2022 upon evaluation today patient's wounds appear to be doing decently well. In fact the left leg is healed although there is brand-new skin were definitely given still need to wrap her  for the time being. On the right leg this is showing signs of some epithelial growth in the middle of the wound it still measures the same because there is still a larger area with speckled  openings but again as far as the entire area being open this is significantly improved which is great news. I am very pleased. 6/13; left leg remains healed and she has a juxta light to apply to it today. There is some concern about whether she is going to be able to do this at home. She lives with a daughter although she has her own disability. On the right the wound looks smaller to me nice epithelialization good edema control. 02-25-2022 upon evaluation today patient appears to be doing well with regard to her legs the left leg is still healed the right leg is doing much better. I think we are on the right track here. With that being said unfortunately she continues to have significant issues with some other problems and she actually tells me on Saturday she was having lunch and went inside to sit down when she tells me she had all of a sudden a sharp sensation that went from her hand through her arm into her leg and her neck and she tells me that she could not see anything for the next hour it was completely black. Slowly after this her vision started to return and has been normal since. It sounds to me as if she may have had a TIA or mini stroke at this point. Nonetheless I think that she needs to have this checked out ASAP. Her primary care provider is Dr. Clide Deutscher at [facility]. We will get a try to get in touch with him. 03-10-2022 upon evaluation today patient's wound actually showed signs of good improvement. She seems to be making excellent progress here. I do not see any evidence of active infection locally or systemically which is great news. No fevers, chills, nausea, vomiting, or diarrhea. She was in the hospital since have seen her last she ended up finding out that she was having syncopal episodes. That is what was going on with the blacking out and losing vision. Subsequently they adjusted some of her medication she seems to be doing better. 03-18-2022 upon evaluation today patient's  wound is actually showing signs of good granulation and epithelization at this point. Fortunately I do not see any signs of active infection locally or systemically which is great news. Objective Constitutional Well-nourished and well-hydrated in no acute distress. Vitals Time Taken: 2:27 PM, Height: 69 in, Weight: 244 lbs, BMI: 36, Temperature: 97.7 F, Pulse: 61 bpm, Respiratory Rate: 18 breaths/min, Blood Pressure: 141/83 mmHg. Respiratory normal breathing without difficulty. Psychiatric this patient is able to make decisions and demonstrates good insight into disease process. Alert and Oriented x 3. pleasant and cooperative. General Notes: Upon inspection patient's wound bed actually showed signs of some slough and biofilm I then performed debridement to clear some of this away she tolerated this without complication postdebridement the wound is doing much better. Integumentary (Hair, Skin) Wound #4 status is Open. Original cause of wound was Gradually Appeared. The date acquired was: 10/27/2021. The wound has been in treatment 20 weeks. The wound is located on the Right,Lateral Lower Leg. The wound measures 5cm length x 7cm width x 0.1cm depth; 27.489cm^2 area and 2.749cm^3 volume. There is no tunneling or undermining noted. There is a medium amount of serosanguineous drainage noted. There is medium (34-66%) pink granulation within the  wound bed. There is a medium (34-66%) amount of necrotic tissue within the wound bed including MERIE, WULF ([id number]) [place]. 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 There was a Three Layer Compression Therapy  Procedure by Carlene Coria, RN. Post procedure Diagnosis Wound #4: Same as Pre-Procedure Plan Follow-up Appointments: Return Appointment in 1 week. Nurse Visit as needed Bathing/ Shower/ Hygiene: May shower with wound dressing protected with water repellent cover or cast protector. No tub bath. Anesthetic (Use 'Patient Medications' Section for Anesthetic Order Entry): Lidocaine applied to wound bed Edema Control - Lymphedema / Segmental Compressive Device / Other: Optional: One layer of unna paste to top of compression wrap (to act as an anchor). - she needs this Patient to wear own Velcro compression garment. Remove compression stockings every night before going to bed and put on every morning when getting up. - left leg Elevate, Exercise Daily and Avoid Standing for Long Periods of Time. Elevate legs to the level of the heart and pump ankles as often as possible Elevate leg(s) parallel to the floor when sitting. Compression Pump: Use compression pump on left lower extremity for 60 minutes, twice daily. - You may pump over wraps Compression Pump: Use compression pump on right lower extremity for 60 minutes, twice daily. - You may pump over wraps Additional Orders / Instructions: Follow Nutritious Diet and Increase Protein Intake WOUND #4: - Lower Leg Wound Laterality: Right, Lateral Cleanser: Soap and Water 1 x Per Week/30 Days Discharge Instructions: Gently cleanse wound with antibacterial soap, rinse and pat dry prior to dressing wounds Cleanser: Wound Cleanser 1 x Per Week/30 Days Discharge Instructions: Wash your hands with soap and water. Remove old dressing, discard into plastic bag and place into trash. Cleanse the wound with Wound Cleanser prior to applying a clean dressing using gauze sponges, not tissues or cotton balls. Do not scrub or use excessive force. Pat dry using gauze sponges, not tissue or cotton balls. Primary Dressing: Silvercel Small 2x2 (in/in) 1 x Per Week/30  Days Discharge Instructions: Apply Silvercel Small 2x2 (in/in) as instructed Secondary Dressing: ABD Pad 5x9 (in/in) 1 x Per Week/30 Days Discharge Instructions: Cover with ABD pad Compression Wrap: 3-LAYER WRAP - Profore Lite LF 3 Multilayer Compression Bandaging System 1 x Per Week/30 Days Discharge Instructions: Apply 3 multi-layer wrap as prescribed. Compression Stockings: Circaid Juxta Lite Compression Wrap Compression Amount: 20-30 mmHg (right) Discharge Instructions: Apply Circaid Juxta Lite Compression Wrap as directed 1. I would recommend that we go ahead and continue with the wound care measures as before and the patient is in agreement with plan this includes the use of the silver alginate dressing which I think is doing quite well. Preston, Katonya ([id number]) 2. Also, recommend we continue with ABD pad to cover followed by 3 layer compression wrap. 3. I am also can recommend the patient continue to monitor for any signs of worsening or infection if anything changes she should let me know. We will see patient back for reevaluation in 1 week here in the clinic. If anything worsens or changes patient will contact our office for additional recommendations. Electronic Signature(s) Signed: 03/18/2022 5:12:50 PM By: Worthy Keeler PA-C Entered By: Worthy Keeler on 03/18/2022 17:12:50  MILDERD, MANOCCHIO ([id number]) -------------------------------------------------------------------------------- [place] Details Patient Name: MARTHE, DANT Date of Service: 03/18/2022 Medical Record Number: [id number] Patient Account Number: [id number] Date of Birth/Sex: Jun 11, 1946 (76 y.o. F) Treating RN: Carlene Coria Primary Care Provider: [name] Other Clinician: Referring Provider: [name] Treating Provider/Extender: Skipper Cliche in Treatment: 60 Diagnosis Coding ICD-10 Codes Code Description I89.0 Lymphedema, not elsewhere classified I87.332 Chronic venous hypertension  (idiopathic) with ulcer and inflammation of left lower extremity L97.812 Non-pressure chronic ulcer of other part of right lower leg with fat layer exposed Facility Procedures CPT4 Code: [id number] Description: (Facility Use Only) [phone] - [facility] RT LEG Modifier: Quantity: 1 Physician Procedures CPT4 Code Description: [id number] - WC PHYS LEVEL 3 - EST PT Modifier: Quantity: 1 CPT4 Code Description: ICD-10 Diagnosis Description I89.0 Lymphedema, not elsewhere classified I87.332 Chronic venous hypertension (idiopathic) with ulcer and inflammation of L97.812 Non-pressure chronic ulcer of other part of right lower leg with fat  lay Modifier: left lower extremity er exposed Quantity: Electronic Signature(s) Signed: 03/18/2022 5:13:15 PM By: Worthy Keeler PA-C Previous Signature: 03/18/2022 4:02:44 PM Version By: Carlene Coria RN Entered By: Worthy Keeler on 03/18/2022 17:13:15

## 2022-03-21 NOTE — Progress Notes (Signed)
SIMRIT, GOHLKE ([id number]) Visit Report for 03/18/2022 Arrival Information Details Patient Name: [name], [name] Date of Service: 03/18/2022 2:00 PM Medical Record Number: [id number] Patient Account Number: [id number] Date of Birth/Sex: Oct 13, 1945 (76 y.o. F) Treating RN: Carlene Coria Primary Care Jaycie Kregel: [name] Other Clinician: Referring Katharina Jehle: [name] Treating Candus Braud/Extender: Skipper Cliche in Treatment: 76 Visit Information History Since Last Visit All ordered tests and consults were completed: No Patient Arrived: Wheel Chair Added or deleted any medications: No Arrival Time: 14:13 Any new allergies or adverse reactions: No Accompanied By: self Had a fall or experienced change in No Transfer Assistance: None activities of daily living that may affect Patient Identification Verified: Yes risk of falls: Secondary Verification Process Completed: Yes Signs or symptoms of abuse/neglect since last visito No Patient Requires Transmission-Based Precautions: No Hospitalized since last visit: No Patient Has Alerts: Yes Implantable device outside of the clinic excluding No Patient Alerts: NOT DIABETIC cellular tissue based products placed in the center since last visit: Has Dressing in Place as Prescribed: Yes Pain Present Now: No Electronic Signature(s) Signed: 03/21/2022 10:45:13 AM By: Carlene Coria RN Entered By: Carlene Coria on 03/18/2022 14:27:43 [name] ([id number]) -------------------------------------------------------------------------------- Clinic Level of Care Assessment Details Patient Name: [name] Date of Service: 03/18/2022 2:00 PM Medical Record Number: [id number] Patient Account Number: [id number] Date of Birth/Sex: 09-Nov-1945 (76 y.o. F) Treating RN: Carlene Coria Primary Care Maleeyah Mccaughey: [name] Other Clinician: Referring Oline Belk: [name] Treating Michell Giuliano/Extender: Skipper Cliche in Treatment: 60 Clinic  Level of Care Assessment Items TOOL 1 Quantity Score []  - Use when EandM and Procedure is performed on INITIAL visit 0 ASSESSMENTS - Nursing Assessment / Reassessment []  - General Physical Exam (combine w/ comprehensive assessment (listed just below) when performed on new 0 pt. evals) []  - 0 Comprehensive Assessment (HX, ROS, Risk Assessments, Wounds Hx, etc.) ASSESSMENTS - Wound and Skin Assessment / Reassessment []  - Dermatologic / Skin Assessment (not related to wound area) 0 ASSESSMENTS - Ostomy and/or Continence Assessment and Care []  - Incontinence Assessment and Management 0 []  - 0 Ostomy Care Assessment and Management (repouching, etc.) PROCESS - Coordination of Care []  - Simple Patient / Family Education for ongoing care 0 []  - 0 Complex (extensive) Patient / Family Education for ongoing care []  - 0 Staff obtains Programmer, systems, Records, Test Results / Process Orders []  - 0 Staff telephones HHA, Nursing Homes / Clarify orders / etc []  - 0 Routine Transfer to another Facility (non-emergent condition) []  - 0 Routine Hospital Admission (non-emergent condition) []  - 0 New Admissions / Biomedical engineer / Ordering NPWT, Apligraf, etc. []  - 0 Emergency Hospital Admission (emergent condition) PROCESS - Special Needs []  - Pediatric / Minor Patient Management 0 []  - 0 Isolation Patient Management []  - 0 Hearing / Language / Visual special needs []  - 0 Assessment of Community assistance (transportation, D/C planning, etc.) []  - 0 Additional assistance / Altered mentation []  - 0 Support Surface(s) Assessment (bed, cushion, seat, etc.) INTERVENTIONS - Miscellaneous []  - External ear exam 0 []  - 0 Patient Transfer (multiple staff / Civil Service fast streamer / Similar devices) []  - 0 Simple Staple / Suture removal (25 or less) []  - 0 Complex Staple / Suture removal (26 or more) []  - 0 Hypo/Hyperglycemic Management (do not check if billed separately) []  - 0 Ankle / Brachial Index  (ABI) - do not check if billed separately Has the patient been seen at the hospital within the last three years: Yes Total Score: 0  Level Of Care: ____ [name] ([id number]) Electronic Signature(s) Signed: 03/21/2022 10:45:13 AM By: Carlene Coria RN Entered By: Carlene Coria on 03/18/2022 16:02:30 [name] ([id number]) -------------------------------------------------------------------------------- Compression Therapy Details Patient Name: [name] Date of Service: 03/18/2022 2:00 PM Medical Record Number: [id number] Patient Account Number: [id number] Date of Birth/Sex: Oct 24, 1945 (76 y.o. F) Treating RN: Carlene Coria Primary Care Daquana Paddock: [name] Other Clinician: Referring Cyra Spader: [name] Treating Blayde Bacigalupi/Extender: Skipper Cliche in Treatment: 60 Compression Therapy Performed for Wound Assessment: Wound #4 Right,Lateral Lower Leg Performed By: Clinician Carlene Coria, RN Compression Type: Three Layer Post Procedure Diagnosis Same as Pre-procedure Electronic Signature(s) Signed: 03/18/2022 4:01:57 PM By: Carlene Coria RN Entered By: Carlene Coria on 03/18/2022 16:01:57 [name] ([id number]) -------------------------------------------------------------------------------- Encounter Discharge Information Details Patient Name: [name] Date of Service: 03/18/2022 2:00 PM Medical Record Number: [id number] Patient Account Number: [id number] Date of Birth/Sex: February 27, 1946 (76 y.o. F) Treating RN: Carlene Coria Primary Care Izan Miron: [name] Other Clinician: Referring Gaye Scorza: [name] Treating Madiha Bambrick/Extender: Skipper Cliche in Treatment: 60 Encounter Discharge Information Items Discharge Condition: Stable Ambulatory Status: Wheelchair Discharge Destination: Home Transportation: Private Auto Accompanied By: self Schedule Follow-up Appointment: Yes Clinical Summary of Care: Electronic Signature(s) Signed: 03/18/2022  4:03:39 PM By: Carlene Coria RN Entered By: Carlene Coria on 03/18/2022 16:03:39 [name] ([id number]) -------------------------------------------------------------------------------- Lower Extremity Assessment Details Patient Name: [name] Date of Service: 03/18/2022 2:00 PM Medical Record Number: [id number] Patient Account Number: [id number] Date of Birth/Sex: 07-Dec-1945 (76 y.o. F) Treating RN: Carlene Coria Primary Care Chief Walkup: [name] Other Clinician: Referring Maxemiliano Riel: [name] Treating Shanielle Correll/Extender: Skipper Cliche in Treatment: 60 Edema Assessment Assessed: [Left: No] [Right: No] Edema: [Left: Ye] [Right: s] Calf Left: Right: Point of Measurement: 31 cm From Medial Instep 51 cm Ankle Left: Right: Point of Measurement: 10 cm From Medial Instep 25 cm Vascular Assessment Pulses: Dorsalis Pedis Palpable: [Right:Yes] Electronic Signature(s) Signed: 03/21/2022 10:45:13 AM By: Carlene Coria RN Entered By: Carlene Coria on 03/18/2022 14:29:43 [name] ([id number]) -------------------------------------------------------------------------------- Multi Wound Chart Details Patient Name: [name] Date of Service: 03/18/2022 2:00 PM Medical Record Number: [id number] Patient Account Number: [id number] Date of Birth/Sex: 1946-04-29 (76 y.o. F) Treating RN: Carlene Coria Primary Care Sesar Madewell: [name] Other Clinician: Referring Chelisa Hennen: [name] Treating Caidyn Henricksen/Extender: Skipper Cliche in Treatment: 60 Vital Signs Height(in): 69 Pulse(bpm): 30 Weight(lbs): 244 Blood Pressure(mmHg): 141/83 Body Mass Index(BMI): 36 Temperature(F): 97.7 Respiratory Rate(breaths/min): 18 Photos: [N/A:N/A] Wound Location: Right, Lateral Lower Leg N/A N/A Wounding Event: Gradually Appeared N/A N/A Primary Etiology: Venous Leg Ulcer N/A N/A Comorbid History: Cataracts, Lymphedema, Sleep N/A N/A Apnea, Congestive Heart  Failure, Hypertension, Osteoarthritis, Neuropathy, Received Chemotherapy, Received Radiation Date Acquired: 10/27/2021 N/A N/A Weeks of Treatment: 20 N/A N/A Wound Status: Open N/A N/A Wound Recurrence: No N/A N/A Measurements L x W x D (cm) 5x7x0.1 N/A N/A Area (cm) : 27.489 N/A N/A Volume (cm) : 2.749 N/A N/A % Reduction in Area: -2233.50% N/A N/A % Reduction in Volume: -2229.70% N/A N/A Classification: Full Thickness Without Exposed N/A N/A Support Structures Exudate Amount: Medium N/A N/A Exudate Type: Serosanguineous N/A N/A Exudate Color: red, brown N/A N/A Granulation Amount: Medium (34-66%) N/A N/A Granulation Quality: Pink N/A N/A Necrotic Amount: Medium (34-66%) N/A N/A Exposed Structures: Fascia: No N/A N/A Fat Layer (Subcutaneous Tissue): No Tendon: No Muscle: No Joint: No Bone: No Epithelialization: None N/A N/A Treatment Notes Electronic Signature(s) Signed: 03/21/2022 10:45:13 AM By: Carlene Coria RN Entered By: Carlene Coria on 03/18/2022  14:30:03 [name], [name] ([id number]) Chianese, [name] ([id number]) -------------------------------------------------------------------------------- Multi-Disciplinary Care Plan Details Patient Name: [name], [name] Date of Service: 03/18/2022 2:00 PM Medical Record Number: [id number] Patient Account Number: [id number] Date of Birth/Sex: April 20, 1946 (76 y.o. F) Treating RN: Carlene Coria Primary Care Nivia Gervase: [name] Other Clinician: Referring Nakiah Osgood: [name] Treating Castiel Lauricella/Extender: Skipper Cliche in Treatment: 60 Active Inactive Necrotic Tissue Nursing Diagnoses: Impaired tissue integrity related to necrotic/devitalized tissue Knowledge deficit related to management of necrotic/devitalized tissue Goals: Necrotic/devitalized tissue will be minimized in the wound bed Date Initiated: 02/04/2022 Target Resolution Date: 02/04/2022 Goal Status: Active Patient/caregiver will verbalize understanding of  reason and process for debridement of necrotic tissue Date Initiated: 02/04/2022 Target Resolution Date: 02/04/2022 Goal Status: Active Interventions: Assess patient pain level pre-, during and post procedure and prior to discharge Provide education on necrotic tissue and debridement process Treatment Activities: Apply topical anesthetic as ordered : 02/04/2022 Excisional debridement : 02/04/2022 Notes: Pain, Acute or Chronic Nursing Diagnoses: Pain Management - Non-cyclic Acute (Procedural) Goals: Patient will verbalize adequate pain control and receive pain control interventions during procedures as needed Date Initiated: 02/04/2022 Target Resolution Date: 02/04/2022 Goal Status: Active Patient/caregiver will verbalize adequate pain control between visits Date Initiated: 02/04/2022 Target Resolution Date: 02/04/2022 Goal Status: Active Patient/caregiver will verbalize comfort level met Date Initiated: 02/04/2022 Target Resolution Date: 02/04/2022 Goal Status: Active Interventions: Complete pain assessment as per visit requirements Provide education on pain management Treatment Activities: Administer pain control measures as ordered : 02/04/2022 Notes: Electronic Signature(s) Signed: 03/21/2022 10:45:13 AM By: Carlene Coria RN Entered By: Carlene Coria on 03/18/2022 14:29:53 [name] ([id number]) [name], [name] ([id number]) -------------------------------------------------------------------------------- Pain Assessment Details Patient Name: [name] Date of Service: 03/18/2022 2:00 PM Medical Record Number: [id number] Patient Account Number: [id number] Date of Birth/Sex: 10-22-1945 (76 y.o. F) Treating RN: Carlene Coria Primary Care Hershy Flenner: [name] Other Clinician: Referring Ismelda Weatherman: [name] Treating Ahtziry Saathoff/Extender: Skipper Cliche in Treatment: 60 Active Problems Location of Pain Severity and Description of Pain Patient Has Paino No Site  Locations Pain Management and Medication Current Pain Management: Electronic Signature(s) Signed: 03/21/2022 10:45:13 AM By: Carlene Coria RN Entered By: Carlene Coria on 03/18/2022 14:28:23 [name] ([id number]) -------------------------------------------------------------------------------- Patient/Caregiver Education Details Patient Name: [name] Date of Service: 03/18/2022 2:00 PM Medical Record Number: [id number] Patient Account Number: [id number] Date of Birth/Gender: Aug 03, 1946 (76 y.o. F) Treating RN: Carlene Coria Primary Care Physician: [name] Other Clinician: Referring Physician: [name] Treating Physician/Extender: Skipper Cliche in Treatment: 74 Education Assessment Education Provided To: Patient Education Topics Provided Pain: Methods: Explain/Verbal Responses: State content correctly Electronic Signature(s) Signed: 03/21/2022 10:45:13 AM By: Carlene Coria RN Entered By: Carlene Coria on 03/18/2022 16:02:54 [name] ([id number]) -------------------------------------------------------------------------------- Wound Assessment Details Patient Name: [name] Date of Service: 03/18/2022 2:00 PM Medical Record Number: [id number] Patient Account Number: [id number] Date of Birth/Sex: July 18, 1946 (76 y.o. F) Treating RN: Carlene Coria Primary Care Martena Emanuele: [name] Other Clinician: Referring Bayyinah Dukeman: [name] Treating Madline Oesterling/Extender: Skipper Cliche in Treatment: 60 Wound Status Wound Number: 4 Primary Venous Leg Ulcer Etiology: Wound Location: Right, Lateral Lower Leg Wound Open Wounding Event: Gradually Appeared Status: Date Acquired: 10/27/2021 Comorbid Cataracts, Lymphedema, Sleep Apnea, Congestive Heart Weeks Of Treatment: 20 History: Failure, Hypertension, Osteoarthritis, Neuropathy, Clustered Wound: No Received Chemotherapy, Received Radiation Photos Wound Measurements Length: (cm) 5 Width: (cm)  7 Depth: (cm) 0.1 Area: (cm) 27.489 Volume: (cm) 2.749 % Reduction in Area: -2233.5% % Reduction in Volume: -2229.7% Epithelialization: None Tunneling: No Undermining: No  Wound Description Classification: Full Thickness Without Exposed Support Structures Exudate Amount: Medium Exudate Type: Serosanguineous Exudate Color: red, brown Foul Odor After Cleansing: No Slough/Fibrino Yes Wound Bed Granulation Amount: Medium (34-66%) Exposed Structure Granulation Quality: Pink Fascia Exposed: No Necrotic Amount: Medium (34-66%) Fat Layer (Subcutaneous Tissue) Exposed: No Necrotic Quality: Adherent Slough Tendon Exposed: No Muscle Exposed: No Joint Exposed: No Bone Exposed: No Treatment Notes Wound #4 (Lower Leg) Wound Laterality: Right, Lateral Cleanser Soap and Water Discharge Instruction: Gently cleanse wound with antibacterial soap, rinse and pat dry prior to dressing wounds Wound Cleanser [name], [name] ([id number]) Discharge Instruction: Wash your hands with soap and water. Remove old dressing, discard into plastic bag and place into trash. Cleanse the wound with Wound Cleanser prior to applying a clean dressing using gauze sponges, not tissues or cotton balls. Do not scrub or use excessive force. Pat dry using gauze sponges, not tissue or cotton balls. Peri-Wound Care Topical Primary Dressing Silvercel Small 2x2 (in/in) Discharge Instruction: Apply Silvercel Small 2x2 (in/in) as instructed Secondary Dressing ABD Pad 5x9 (in/in) Discharge Instruction: Cover with ABD pad Secured With Compression Wrap 3-LAYER WRAP - Profore Lite LF 3 Multilayer Compression Bandaging System Discharge Instruction: Apply 3 multi-layer wrap as prescribed. Compression Stockings Circaid Juxta Lite Compression Wrap Quantity: 1 Right Leg Compression Amount: 20-30 mmHg Discharge Instruction: Apply Circaid Juxta Lite Compression Wrap as directed Add-Ons Electronic Signature(s) Signed:  03/21/2022 10:45:13 AM By: Carlene Coria RN Entered By: Carlene Coria on 03/18/2022 14:28:49 [name] ([id number]) -------------------------------------------------------------------------------- [place] Details Patient Name: [name] Date of Service: 03/18/2022 2:00 PM Medical Record Number: [id number] Patient Account Number: [id number] Date of Birth/Sex: Jan 30, 1946 (76 y.o. F) Treating RN: Carlene Coria Primary Care Murle Hellstrom: [name] Other Clinician: Referring Luzmaria Devaux: [name] Treating Mylynn Dinh/Extender: Skipper Cliche in Treatment: 60 Vital Signs Time Taken: 14:27 Temperature (F): 97.7 Height (in): 69 Pulse (bpm): 61 Weight (lbs): 244 Respiratory Rate (breaths/min): 18 Body Mass Index (BMI): 36 Blood Pressure (mmHg): 141/83 Reference Range: 80 - 120 mg / dl Electronic Signature(s) Signed: 03/21/2022 10:45:13 AM By: Carlene Coria RN Entered By: Carlene Coria on 03/18/2022 14:28:11

## 2022-03-25 ENCOUNTER — Encounter: Payer: Medicare Other | Admitting: Physician Assistant

## 2022-03-25 DIAGNOSIS — I872 Venous insufficiency (chronic) (peripheral): Secondary | ICD-10-CM | POA: Diagnosis not present

## 2022-03-25 NOTE — Progress Notes (Signed)
[name], [name] ([id number]) Visit Report for 03/25/2022 Chief Complaint Document Details Patient Name: [name], [name] Date of Service: 03/25/2022 2:00 PM Medical Record Number: [id number] Patient Account Number: [id number] Date of Birth/Sex: 07/08/1946 (76 y.o. F) Treating RN: Carlene Coria Primary Care Provider: [name] Other Clinician: Referring Provider: [name] Treating Provider/Extender: Skipper Cliche in Treatment: 29 Information Obtained from: Patient Chief Complaint Bilateral LE Ulcers Electronic Signature(s) Signed: 03/25/2022 2:35:06 PM By: Worthy Keeler PA-C Entered By: Worthy Keeler on 03/25/2022 14:35:05 [name] ([id number]) -------------------------------------------------------------------------------- Physician Orders Details Patient Name: [name] Date of Service: 03/25/2022 2:00 PM Medical Record Number: [id number] Patient Account Number: [id number] Date of Birth/Sex: February 27, 1946 (76 y.o. F) Treating RN: Carlene Coria Primary Care Provider: [name] Other Clinician: Referring Provider: [name] Treating Provider/Extender: Skipper Cliche in Treatment: 34 Verbal / Phone Orders: No Diagnosis Coding Follow-up Appointments o Return Appointment in 1 week. o Nurse Visit as needed Bathing/ Shower/ Hygiene o May shower with wound dressing protected with water repellent cover or cast protector. o No tub bath. Anesthetic (Use 'Patient Medications' Section for Anesthetic Order Entry) o Lidocaine applied to wound bed Edema Control - Lymphedema / Segmental Compressive Device / Other Bilateral Lower Extremities o Optional: One layer of unna paste to top of compression wrap (to act as an anchor). - she needs this o Patient to wear own Velcro compression garment. Remove compression stockings every night before going to bed and put on every morning when getting up. - left leg o Elevate, Exercise Daily and Avoid  Standing for Long Periods of Time. o Elevate legs to the level of the heart and pump ankles as often as possible o Elevate leg(s) parallel to the floor when sitting. o Compression Pump: Use compression pump on left lower extremity for 60 minutes, twice daily. - You may pump over wraps o Compression Pump: Use compression pump on right lower extremity for 60 minutes, twice daily. - You may pump over wraps Additional Orders / Instructions o Follow Nutritious Diet and Increase Protein Intake Wound Treatment Wound #4 - Lower Leg Wound Laterality: Right, Lateral Cleanser: Soap and Water 1 x Per Week/30 Days Discharge Instructions: Gently cleanse wound with antibacterial soap, rinse and pat dry prior to dressing wounds Cleanser: Wound Cleanser 1 x Per Week/30 Days Discharge Instructions: Wash your hands with soap and water. Remove old dressing, discard into plastic bag and place into trash. Cleanse the wound with Wound Cleanser prior to applying a clean dressing using gauze sponges, not tissues or cotton balls. Do not scrub or use excessive force. Pat dry using gauze sponges, not tissue or cotton balls. Primary Dressing: Silvercel Small 2x2 (in/in) 1 x Per Week/30 Days Discharge Instructions: Apply Silvercel Small 2x2 (in/in) as instructed Secondary Dressing: ABD Pad 5x9 (in/in) 1 x Per Week/30 Days Discharge Instructions: Cover with ABD pad Compression Wrap: 3-LAYER WRAP - Profore Lite LF 3 Multilayer Compression Bandaging System 1 x Per Week/30 Days Discharge Instructions: Apply 3 multi-layer wrap as prescribed. Compression Stockings: Circaid Juxta Lite Compression Wrap Right Leg Compression Amount: 20-30 mmHG Discharge Instructions: Apply Circaid Juxta Lite Compression Wrap as directed Electronic Signature(s) Unsigned Entered By: Carlene Coria on 03/25/2022 14:37:25 Signature(s): Date(s): [name], [name] ([id number]) [name], [name]  ([id number]) -------------------------------------------------------------------------------- Problem List Details Patient Name: [name], [name] Date of Service: 03/25/2022 2:00 PM Medical Record Number: [id number] Patient Account Number: [id number] Date of Birth/Sex: 09/09/1945 (76 y.o. 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 Chronic venous hypertension (idiopathic) with ulcer and inflammation of 01/16/2021 No Yes left lower extremity L97.812 Non-pressure chronic ulcer of other part of right lower leg with fat layer 01/07/2022 No Yes exposed Inactive Problems ICD-10 Code Description Active Date Inactive Date L97.822 Non-pressure chronic ulcer of other part of left lower leg with fat layer exposed 01/16/2021 01/16/2021 Resolved Problems Electronic Signature(s) Signed: 03/25/2022 2:35:00 PM By: Worthy Keeler PA-C Entered By: Worthy Keeler on 03/25/2022 14:35:00 [name] ([id number]) -------------------------------------------------------------------------------- [place] Details Patient Name: [name] Date of Service: 03/25/2022 Medical Record Number: [id number] Patient Account Number: [id number] Date of Birth/Sex: 12-Jan-1946 (76 y.o. F) Treating RN: Carlene Coria Primary Care Provider: [name] Other Clinician: Referring Provider: [name] Treating Provider/Extender: Skipper Cliche in Treatment: 61 Diagnosis Coding ICD-10 Codes Code Description I89.0 Lymphedema, not elsewhere classified I87.332 Chronic venous hypertension (idiopathic) with ulcer and inflammation of left lower extremity L97.812 Non-pressure chronic ulcer of other part of right lower leg with fat layer  exposed Facility Procedures CPT4 Code: [id number] Description: (Facility Use Only) [phone] - [facility] RT LEG Modifier: Quantity: 1 Electronic Signature(s) Unsigned Entered ByCarlene Coria on 03/25/2022 14:38:20 Signature(s): Date(s):

## 2022-03-26 NOTE — Progress Notes (Signed)
DE, LIBMAN ([id number]) Visit Report for 03/25/2022 Arrival Information Details Patient Name: [name], [name] Date of Service: 03/25/2022 2:00 PM Medical Record Number: [id number] Patient Account Number: [id number] Date of Birth/Sex: 1946/06/22 (76 y.o. F) Treating RN: Carlene Coria Primary Care Mariadejesus Cade: [name] Other Clinician: Referring Juanangel Soderholm: [name] Treating Reiner Loewen/Extender: Skipper Cliche in Treatment: 37 Visit Information History Since Last Visit All ordered tests and consults were completed: No Patient Arrived: Wheel Chair Added or deleted any medications: No Arrival Time: 13:59 Any new allergies or adverse reactions: No Accompanied By: self Had a fall or experienced change in No Transfer Assistance: None activities of daily living that may affect Patient Identification Verified: Yes risk of falls: Secondary Verification Process Completed: Yes Signs or symptoms of abuse/neglect since last visito No Patient Requires Transmission-Based Precautions: No Hospitalized since last visit: No Patient Has Alerts: Yes Has Dressing in Place as Prescribed: Yes Patient Alerts: NOT DIABETIC Has Compression in Place as Prescribed: Yes Pain Present Now: No Electronic Signature(s) Signed: 03/26/2022 3:29:37 PM By: Carlene Coria RN Entered By: Carlene Coria on 03/25/2022 14:05:37 [name] ([id number]) -------------------------------------------------------------------------------- Clinic Level of Care Assessment Details Patient Name: [name] Date of Service: 03/25/2022 2:00 PM Medical Record Number: [id number] Patient Account Number: [id number] Date of Birth/Sex: 1946-05-03 (76 y.o. F) Treating RN: Carlene Coria Primary Care Fred Hammes: [name] Other Clinician: Referring Davion Flannery: [name] Treating Claudette Wermuth/Extender: Skipper Cliche in Treatment: 61 Clinic Level of Care Assessment Items TOOL 1 Quantity Score []  - Use when EandM and  Procedure is performed on INITIAL visit 0 ASSESSMENTS - Nursing Assessment / Reassessment []  - General Physical Exam (combine w/ comprehensive assessment (listed just below) when performed on new 0 pt. evals) []  - 0 Comprehensive Assessment (HX, ROS, Risk Assessments, Wounds Hx, etc.) ASSESSMENTS - Wound and Skin Assessment / Reassessment []  - Dermatologic / Skin Assessment (not related to wound area) 0 ASSESSMENTS - Ostomy and/or Continence Assessment and Care []  - Incontinence Assessment and Management 0 []  - 0 Ostomy Care Assessment and Management (repouching, etc.) PROCESS - Coordination of Care []  - Simple Patient / Family Education for ongoing care 0 []  - 0 Complex (extensive) Patient / Family Education for ongoing care []  - 0 Staff obtains Programmer, systems, Records, Test Results / Process Orders []  - 0 Staff telephones HHA, Nursing Homes / Clarify orders / etc []  - 0 Routine Transfer to another Facility (non-emergent condition) []  - 0 Routine Hospital Admission (non-emergent condition) []  - 0 New Admissions / Biomedical engineer / Ordering NPWT, Apligraf, etc. []  - 0 Emergency Hospital Admission (emergent condition) PROCESS - Special Needs []  - Pediatric / Minor Patient Management 0 []  - 0 Isolation Patient Management []  - 0 Hearing / Language / Visual special needs []  - 0 Assessment of Community assistance (transportation, D/C planning, etc.) []  - 0 Additional assistance / Altered mentation []  - 0 Support Surface(s) Assessment (bed, cushion, seat, etc.) INTERVENTIONS - Miscellaneous []  - External ear exam 0 []  - 0 Patient Transfer (multiple staff / Civil Service fast streamer / Similar devices) []  - 0 Simple Staple / Suture removal (25 or less) []  - 0 Complex Staple / Suture removal (26 or more) []  - 0 Hypo/Hyperglycemic Management (do not check if billed separately) []  - 0 Ankle / Brachial Index (ABI) - do not check if billed separately Has the patient been seen at the  hospital within the last three years: Yes Total Score: 0 Level Of Care: ____ [name] ([id number]) Electronic Signature(s) Signed: 03/26/2022 3:29:37  PM By: Carlene Coria RN Entered By: Carlene Coria on 03/25/2022 14:38:10 [name] ([id number]) -------------------------------------------------------------------------------- Compression Therapy Details Patient Name: [name] Date of Service: 03/25/2022 2:00 PM Medical Record Number: [id number] Patient Account Number: [id number] Date of Birth/Sex: [birth year] (76 y.o. F) Treating RN: Carlene Coria Primary Care Tyee Vandevoorde: [name] Other Clinician: Referring Doneisha Ivey: [name] Treating Mazal Ebey/Extender: Skipper Cliche in Treatment: 61 Compression Therapy Performed for Wound Assessment: Wound #4 Right,Lateral Lower Leg Performed By: Clinician Carlene Coria, RN Compression Type: Three Layer Post Procedure Diagnosis Same as Pre-procedure Electronic Signature(s) Signed: 03/26/2022 3:29:37 PM By: Carlene Coria RN Entered By: Carlene Coria on 03/25/2022 14:37:56 [name] ([id number]) -------------------------------------------------------------------------------- Encounter Discharge Information Details Patient Name: [name] Date of Service: 03/25/2022 2:00 PM Medical Record Number: [id number] Patient Account Number: [id number] Date of Birth/Sex: May 19, 1946 (76 y.o. F) Treating RN: Carlene Coria Primary Care Durga Saldarriaga: [name] Other Clinician: Referring Aimie Wagman: [name] Treating Clark Clowdus/Extender: Skipper Cliche in Treatment: 30 Encounter Discharge Information Items Discharge Condition: Stable Ambulatory Status: Wheelchair Discharge Destination: Home Transportation: Private Auto Accompanied By: self Schedule Follow-up Appointment: Yes Clinical Summary of Care: Electronic Signature(s) Signed: 03/26/2022 3:29:37 PM By: Carlene Coria RN Entered By: Carlene Coria on 03/25/2022  14:39:11 [name] ([id number]) -------------------------------------------------------------------------------- Lower Extremity Assessment Details Patient Name: [name] Date of Service: 03/25/2022 2:00 PM Medical Record Number: [id number] Patient Account Number: [id number] Date of Birth/Sex: Sep 17, 1945 (76 y.o. F) Treating RN: Carlene Coria Primary Care Janett Kamath: [name] Other Clinician: Referring Darinda [place]: [name] Treating Doni Widmer/Extender: Skipper Cliche in Treatment: 61 Edema Assessment Assessed: [Left: No] [Right: No] Edema: [Left: Ye] [Right: s] Calf Left: Right: Point of Measurement: 31 cm From Medial Instep 55 cm Ankle Left: Right: Point of Measurement: 10 cm From Medial Instep 25 cm Vascular Assessment Pulses: Dorsalis Pedis Palpable: [Right:Yes] Electronic Signature(s) Signed: 03/26/2022 3:29:37 PM By: Carlene Coria RN Entered By: Carlene Coria on 03/25/2022 14:17:27 [name] ([id number]) -------------------------------------------------------------------------------- Multi Wound Chart Details Patient Name: [name] Date of Service: 03/25/2022 2:00 PM Medical Record Number: [id number] Patient Account Number: [id number] Date of Birth/Sex: [birth year] (76 y.o. F) Treating RN: Carlene Coria Primary Care Ellie Bryand: [name] Other Clinician: Referring Kya Mayfield: [name] Treating Aleecia Tapia/Extender: Skipper Cliche in Treatment: 61 Vital Signs Height(in): 69 Pulse(bpm): 81 Weight(lbs): 244 Blood Pressure(mmHg): 145/75 Body Mass Index(BMI): 36 Temperature(F): 98.7 Respiratory Rate(breaths/min): 18 Photos: [N/A:N/A] Wound Location: Right, Lateral Lower Leg N/A N/A Wounding Event: Gradually Appeared N/A N/A Primary Etiology: Venous Leg Ulcer N/A N/A Comorbid History: Cataracts, Lymphedema, Sleep N/A N/A Apnea, Congestive Heart Failure, Hypertension, Osteoarthritis, Neuropathy, Received Chemotherapy, Received  Radiation Date Acquired: 10/27/2021 N/A N/A Weeks of Treatment: 21 N/A N/A Wound Status: Open N/A N/A Wound Recurrence: No N/A N/A Measurements L x W x D (cm) 5x5.5x0.1 N/A N/A Area (cm) : 21.598 N/A N/A Volume (cm) : 2.16 N/A N/A % Reduction in Area: -1733.40% N/A N/A % Reduction in Volume: -1730.50% N/A N/A Classification: Full Thickness Without Exposed N/A N/A Support Structures Exudate Amount: Medium N/A N/A Exudate Type: Serosanguineous N/A N/A Exudate Color: red, brown N/A N/A Granulation Amount: Medium (34-66%) N/A N/A Granulation Quality: Pink N/A N/A Necrotic Amount: Medium (34-66%) N/A N/A Exposed Structures: Fascia: No N/A N/A Fat Layer (Subcutaneous Tissue): No Tendon: No Muscle: No Joint: No Bone: No Epithelialization: None N/A N/A Treatment Notes Electronic Signature(s) Signed: 03/26/2022 3:29:37 PM By: Carlene Coria RN Entered By: Carlene Coria on 03/25/2022 14:18:57 [name] ([id number]) [place], [name] ([id number]) -------------------------------------------------------------------------------- Multi-Disciplinary Care Plan Details  Patient Name: [name], [name] Date of Service: 03/25/2022 2:00 PM Medical Record Number: [id number] Patient Account Number: [id number] Date of Birth/Sex: [birth year] (76 y.o. F) Treating RN: Carlene Coria Primary Care Devone Bonilla: [name] Other Clinician: Referring Kayliah Tindol: [name] Treating Sulay Brymer/Extender: Skipper Cliche in Treatment: 61 Active Inactive Pain, Acute or Chronic Nursing Diagnoses: Pain Management - Non-cyclic Acute (Procedural) Goals: Patient will verbalize adequate pain control and receive pain control interventions during procedures as needed Date Initiated: 02/04/2022 Date Inactivated: 03/25/2022 Target Resolution Date: 02/04/2022 Goal Status: Met Patient/caregiver will verbalize adequate pain control between visits Date Initiated: 02/04/2022 Date Inactivated: 03/25/2022 Target  Resolution Date: 02/04/2022 Goal Status: Met Patient/caregiver will verbalize comfort level met Date Initiated: 02/04/2022 Target Resolution Date: 02/04/2022 Goal Status: Active Interventions: Complete pain assessment as per visit requirements Provide education on pain management Treatment Activities: Administer pain control measures as ordered : 02/04/2022 Notes: Electronic Signature(s) Signed: 03/26/2022 3:29:37 PM By: Carlene Coria RN Entered By: Carlene Coria on 03/25/2022 14:18:38 [name] ([id number]) -------------------------------------------------------------------------------- Pain Assessment Details Patient Name: [name] Date of Service: 03/25/2022 2:00 PM Medical Record Number: [id number] Patient Account Number: [id number] Date of Birth/Sex: Oct 02, 1945 (76 y.o. F) Treating RN: Carlene Coria Primary Care Luby Seamans: [name] Other Clinician: Referring Jeren Dufrane: [name] Treating Ibraheem Voris/Extender: Skipper Cliche in Treatment: 61 Active Problems Location of Pain Severity and Description of Pain Patient Has Paino No Site Locations Pain Management and Medication Current Pain Management: Electronic Signature(s) Signed: 03/26/2022 3:29:37 PM By: Carlene Coria RN Entered By: Carlene Coria on 03/25/2022 14:07:35 [name] ([id number]) -------------------------------------------------------------------------------- Patient/Caregiver Education Details Patient Name: [name] Date of Service: 03/25/2022 2:00 PM Medical Record Number: [id number] Patient Account Number: [id number] Date of Birth/Gender: 16-Jan-1946 (76 y.o. F) Treating RN: Carlene Coria Primary Care Physician: [name] Other Clinician: Referring Physician: [name] Treating Physician/Extender: Skipper Cliche in Treatment: 69 Education Assessment Education Provided To: Patient Education Topics Provided Pain: Methods: Explain/Verbal Responses: State content  correctly Electronic Signature(s) Signed: 03/26/2022 3:29:37 PM By: Carlene Coria RN Entered By: Carlene Coria on 03/25/2022 14:38:34 [name] ([id number]) -------------------------------------------------------------------------------- Wound Assessment Details Patient Name: [name] Date of Service: 03/25/2022 2:00 PM Medical Record Number: [id number] Patient Account Number: [id number] Date of Birth/Sex: 1946/01/23 (76 y.o. F) Treating RN: Carlene Coria Primary Care Crispin Vogel: [name] Other Clinician: Referring Onyinyechi Huante: [name] Treating Yaretsi Humphres/Extender: Skipper Cliche in Treatment: 61 Wound Status Wound Number: 4 Primary Venous Leg Ulcer Etiology: Wound Location: Right, Lateral Lower Leg Wound Open Wounding Event: Gradually Appeared Status: Date Acquired: 10/27/2021 Comorbid Cataracts, Lymphedema, Sleep Apnea, Congestive Heart Weeks Of Treatment: 21 History: Failure, Hypertension, Osteoarthritis, Neuropathy, Clustered Wound: No Received Chemotherapy, Received Radiation Photos Wound Measurements Length: (cm) 5 Width: (cm) 5.5 Depth: (cm) 0.1 Area: (cm) 21.598 Volume: (cm) 2.16 % Reduction in Area: -1733.4% % Reduction in Volume: -1730.5% Epithelialization: None Tunneling: No Undermining: No Wound Description Classification: Full Thickness Without Exposed Support Structures Exudate Amount: Medium Exudate Type: Serosanguineous Exudate Color: red, brown Foul Odor After Cleansing: No Slough/Fibrino Yes Wound Bed Granulation Amount: Medium (34-66%) Exposed Structure Granulation Quality: Pink Fascia Exposed: No Necrotic Amount: Medium (34-66%) Fat Layer (Subcutaneous Tissue) Exposed: No Necrotic Quality: Adherent Slough Tendon Exposed: No Muscle Exposed: No Joint Exposed: No Bone Exposed: No Treatment Notes Wound #4 (Lower Leg) Wound Laterality: Right, Lateral Cleanser Soap and Water Discharge Instruction: Gently cleanse wound with  antibacterial soap, rinse and pat dry prior to dressing wounds Wound Cleanser LATEEFA, CROSBY ([id number]) Discharge Instruction: Wash your hands with  soap and water. Remove old dressing, discard into plastic bag and place into trash. Cleanse the wound with Wound Cleanser prior to applying a clean dressing using gauze sponges, not tissues or cotton balls. Do not scrub or use excessive force. Pat dry using gauze sponges, not tissue or cotton balls. Peri-Wound Care Topical Primary Dressing Silvercel Small 2x2 (in/in) Discharge Instruction: Apply Silvercel Small 2x2 (in/in) as instructed Secondary Dressing ABD Pad 5x9 (in/in) Discharge Instruction: Cover with ABD pad Secured With Compression Wrap 3-LAYER WRAP - Profore Lite LF 3 Multilayer Compression Bandaging System Discharge Instruction: Apply 3 multi-layer wrap as prescribed. Compression Stockings Circaid Juxta Lite Compression Wrap Quantity: 1 Right Leg Compression Amount: 20-30 mmHg Discharge Instruction: Apply Circaid Juxta Lite Compression Wrap as directed Add-Ons Electronic Signature(s) Signed: 03/26/2022 3:29:37 PM By: Carlene Coria RN Entered By: Carlene Coria on 03/25/2022 14:16:23 [name] ([id number]) -------------------------------------------------------------------------------- [place] Details Patient Name: [name] Date of Service: 03/25/2022 2:00 PM Medical Record Number: [id number] Patient Account Number: [id number] Date of Birth/Sex: 10/03/1945 (76 y.o. F) Treating RN: Carlene Coria Primary Care Chisom Muntean: [name] Other Clinician: Referring Dearia Wilmouth: [name] Treating Santiago Stenzel/Extender: Skipper Cliche in Treatment: 61 Vital Signs Time Taken: 14:05 Temperature (F): 98.7 Height (in): 69 Pulse (bpm): 81 Weight (lbs): 244 Respiratory Rate (breaths/min): 18 Body Mass Index (BMI): 36 Blood Pressure (mmHg): 145/75 Reference Range: 80 - 120 mg / dl Electronic Signature(s) Signed:  03/26/2022 3:29:37 PM By: Carlene Coria RN Entered By: Carlene Coria on 03/25/2022 14:07:22

## 2022-04-01 ENCOUNTER — Encounter: Payer: Medicare Other | Admitting: Internal Medicine

## 2022-04-01 DIAGNOSIS — I872 Venous insufficiency (chronic) (peripheral): Secondary | ICD-10-CM | POA: Diagnosis not present

## 2022-04-01 NOTE — Progress Notes (Signed)
[name], [name] ([id number]) Visit Report for 04/01/2022 HPI Details Patient Name: [name], [name] Date of Service: 04/01/2022 2:00 PM Medical Record Number: [id number] Patient Account Number: [id number] Date of Birth/Sex: [birth year] (76 y.o. F) Treating RN: Carlene Coria Primary Care Provider: [name] Other Clinician: Referring Provider: [name] Treating Provider/Extender: Tito Dine in Treatment: 54 History of Present Illness HPI Description: 03/15/2020 upon evaluation today patient presents today for initial evaluation here in our clinic concerning a wound that is on the left posterior lower extremity. Unfortunately this has been giving the patient some discomfort at this point which she notes has been affected her pretty much daily. The patient does have a history of venous insufficiency, hypertension, congestive heart failure, and a history of breast cancer. Fortunately there does not appear to be evidence of active infection at this time which is great news. No fevers, chills, nausea, vomiting, or diarrhea. Wound is not extremely large but does have some slough covering the surface of the wound. This is going require sharp debridement today. 03/15/2020 upon evaluation today patient appears to be doing fairly well in regard to her wound. She does have some slough noted on the surface of the wound currently but I do believe the Iodoflex has been beneficial over the past week. There is no signs of active infection at this time which is great news and overall very pleased with the progress. No fevers, chills, nausea, vomiting, or diarrhea. 04/02/2020 upon evaluation today patient appears to be doing a little better in regard to her wound size wise this is not tremendously smaller she does have some slough buildup on the surface of the wound. With that being said this is going require some sharp debridement today to clear away some of the necrotic debris. Fortunately there is  no evidence of active infection at this time. No fevers, chills, nausea, vomiting, or diarrhea. 04/10/2020 on evaluation today patient appears to be doing well with regard to her ulcer which is measuring somewhat smaller today. She actually has more granulation tissue noted which is also good news is no need for sharp debridement today. Fortunately there is no evidence of infection either which is also excellent. No fevers, chills, nausea, vomiting, or diarrhea. 04/26/2020 on evaluation today patient's wound actually is showing signs of good improvement which is great news. There does not appear to be any evidence of active infection. I do believe she is tolerating the collagen at this point. 05/03/2020 upon evaluation today patient's wound actually showed signs of good granulation at this time there does not appear to be any evidence of active infection which is great news and overall very pleased with where things stand. With that being said I do believe that the patient is can require some sharp debridement today but fortunately nothing too significant. 05/11/20 on evaluation today patient appears to be doing well in regard to her leg ulcer. She's been tolerating the dressing changes without complication. Fortunately there is no signs of active infection at this time. Overall I'm very pleased with where things stand. 05/18/2020 upon evaluation today patient's wound is showing signs of improvement is very dry and I think the collagen for the most part just dried to the wound bed. I am going to clean this away with sharp debridement in order to get this under control in my opinion. 05/25/2020 upon evaluation today patient actually appears to be doing well in regard to her leg ulcer. In fact upon inspection it appears she could potentially be  completely healed but that is not guaranteed based on what we are seeing. There does not appear to be any signs of active infection which is great news. 05/31/2020 on  evaluation today patient appears to be doing well with regard to her wounds. She in fact appears to be almost completely healed on the left leg there is just a very small opening remaining point 1 06/08/2020 upon evaluation today patient appears to be doing excellent in regard to her leg ulcer on the left in fact it appears to be that she is completely healed as of today. I see no signs of active infection at this time which is great news. No fevers, chills, nausea, vomiting, or diarrhea. READMISSION 01/16/2021 Patient that we have had in this clinic with a wound on the left posterior calf in the summer 2021 extending into October. She was felt to have chronic venous insufficiency. Per the patient's description she was discharged with what sounds like external compression stockings although she could not afford the "$75 per leg". She therefore did not get anything. Apparently her wound that she has currently is been in existence since January. She has been followed at vein and vascular with Unna boots and I think calcium alginate. She had ABIs done on 06/25/2020 that showed an noncompressible ABI on the right leg and the left leg but triphasic waveforms with good great toe pressures and a TBI of 0.90 on the right and 0.91 on the left Surprisingly the patient also had venous reflux studies that were really unremarkable. This included no evidence of DVTs or SVTs but there was no evidence of deep venous insufficiency or superficial venous insufficiency in the greater saphenous or short saphenous veins bilaterally. I would wonder about more central venous issues or perhaps this is all lymphedema 01/25/2021 upon evaluation today patient appears to be doing decently well and guard to her wound. The good news is the Iodoflex that is job she saw Dr. Dellia Nims last week for readmission and to be honest I think that this has done extremely well over that week. I do believe that she can tolerate the sharp  debridement today which will be good. I think that cleaning off the wound will likely allow [place] to be able to use a different type of dressing I do not think the Iodoflex will even be necessary based on how clean the wound looks. Fortunately there is no sign of active infection at this time which is great news. No fevers, chills, nausea, vomiting, or diarrhea. [name], [name] ([id number]) 02/12/2021 upon evaluation today patient appears to be doing well with regard to her leg ulcer. We did switch to alginate when she came for nurse visit I think is doing much better for her. Fortunately there does not appear to be any signs of active infection at this time which is great news. No fevers, chills, nausea, vomiting, or diarrhea. 02/18/2021 upon evaluation today patient's wound is actually showing signs of good improvement. I am very pleased with where things stand currently. I do not see any signs of active infection which is great news and overall I feel like she is making good progress. The patient likewise is happy that things are doing so well and that this is measuring somewhat smaller. 02/25/2021 upon evaluation today patient actually appears to be doing quite well in regard to her wounds. She has been tolerating the dressing changes without complication. Fortunately there does not appear to be any signs of active infection which is great  news and overall I am extremely pleased with where things stand. No fevers, chills, nausea, vomiting, or diarrhea. She does have a lot of edema I really do think she needs compression socks to be worn in order to prevent things from causing additional issues for her currently. Especially on the right leg she should be wearing compression socks all the time to be honest. 03/04/2021 upon evaluation today patient's leg ulcer actually appears to be doing pretty well which is great news. There does not appear to be any significant change but overall the appearance is  better even though the size is not necessarily reflecting a great improvement. Fortunately there does not appear to be any signs of active infection. No fevers, chills, nausea, vomiting, or diarrhea. 03/12/2021 upon evaluation today patient appears to be doing well with regard to her wounds. She has been tolerating the dressing changes without complication. The good news is that the wound on her left lateral leg is doing much better and is showing signs of good improvement. Overall her swelling is controlled well as well. She does need some compression socks she plans to go Jefferey socks next week. 03/18/2021 on evaluation today patient's wound actually is showing signs of good improvement. She does have a little bit of slough this can require some sharp debridement today. 03/25/2021 upon evaluation today patient appears to be doing well with regard to her wound on the left lateral leg. She has been tolerating the dressing changes without complication. Fortunately there does not appear to be any signs of active infection at this time. No fever chills no 04/01/2021 upon evaluation today patient's wound is showing signs of improvement and overall very pleased with where things stand at this point. There is no evidence of active infection which is great news as well and in general I think that she is making great progress. 04/09/2021 upon evaluation today patient appears to be doing well with regard to her ankle ulcer. She is making good progress currently which is great news. There does not appear to be any signs of infection also excellent news. In general I am extremely pleased with where things stand today. No fevers, chills, nausea, vomiting, or diarrhea. 04/23/2021 upon inspection today patient appears to be doing quite well with regard to her leg ulcer. She has been tolerating the dressing changes without complication. She is can require some sharp debridement today but overall seems to be doing  excellent. 05/06/2021 upon evaluation today patient unfortunately appears to be doing poorly overall in regard to her swelling. She is actually significantly more swollen than last week despite the fact that her wound is doing better this is not a good trend. I think that she probably needs to see her cardiologist ASAP and I discussed that with her today. This includes the fact that honestly I think she probably is getting need an increase in her diuretic or something to try to clear away some of the excess fluid that she is experiencing at the moment. She is not been doing her pumps even twice a day but again right now more concerned that if she were to do the pump she would fluid overload her lungs causing other issues as far as her breathing is concerned. Obviously I think she needs to contact them and move up the appointment for the 12th of something sooner 2 weeks is a bit too long to wait with how swollen she has. 05/14/2021 upon evaluation today patient's wound actually showing signs of being  a little bit smaller compared to previous. She has been making good progress however. Fortunately there does not appear to be any evidence of active infection which is great. Overall I am extremely pleased in that regard. Nonetheless I am still concerned that she is having quite a bit of issue as far as the swelling is concerned she has been placed on fluid restriction as well as an increase in the furosemide by her doctor. We will see how things progress. 05/21/2021 upon evaluation today patient appears to be doing well with regard to her wound in general. She has been tolerating the dressing changes without complication. With that being said she has been using silver cell for a while and while this was showing signs of improvement it is now gotten to the point where this has slowed down quite significantly. I do believe that switching to a different dressing may be beneficial for her today. 05/28/2021 upon  evaluation today patient actually appears to be doing quite well in regard to her wound. In fact there is really not any need for sharp debridement today based on what I see. The Hydrofera Blue is doing great and overall I think that she is managing quite nicely with the compression wrapping. Her edema is still down quite a bit with the wrapping in place. 06/11/2021 upon evaluation today patient appears to be doing well with regard to her leg ulcer. She has been tolerating the dressing changes without complication. Fortunately there does not appear to be any signs of active infection at this time which is great news. No fevers, chills, nausea, vomiting, or diarrhea. She is going require some sharp debridement today. 10/11; left leg ulcer much better looking and with improved surface area. She is using Hydrofera Blue under 3 layer compression. She does not have any stocking on the right leg which in itself has very significant nonpitting edema 06/25/2021 upon evaluation today patient appears to be doing well with regard to the wound on her left leg. She has been tolerating the dressing changes without complication and overall I am extremely pleased with where things stand today. I do not see any evidence of infection which is great news. 07/02/2021 upon evaluation today patient's wound actually showing signs of excellent improvement and actually very pleased with where we stand today and the overall appearance. Fortunately there does not appear to be any signs of active infection. No fevers, chills, nausea, vomiting, or diarrhea. 07/09/2021 upon evaluation today patient appears to be doing better in regard to her wound this is measuring smaller and she is headed in the appropriate direction based on what I am seeing currently. I do not see any evidence of active infection systemically which is great news. 07/16/2021 upon evaluation today patient appears to be doing pretty well currently in regard to her  leg ulcer. This is measuring smaller and looking much better this is great news. Fortunately there does not appear to be any evidence of active infection systemically which is great news TIPPI, MCCRAE ([id number]) as well. 07/23/2021 upon evaluation today patient's wound is actually showing signs of good improvement this is definitely measuring smaller. I do not see any signs of infection which is great news and overall very pleased in that regard. Overall I think that she is doing well with the [facility]. 07/30/2020 upon evaluation today patient appears to be doing well in regard to her wound. She has been tolerating the dressing changes without complication think the Hydrofera Blue at this point  is actually causing her to dry out a lot as far as the wound bed is concerned. Subsequently I think that we need to try to see what we can do to improve this. Fortunately there does not appear to be any evidence of active infection at this time which is great news. 08/06/2021 upon evaluation today patient appears to be doing well with regards to her wound. Is not measuring significantly smaller initial inspection but upon closer inspection she has a lot of new skin growth across the central portion of the wound. I think will get very close to complete resolution. 08/13/2021 upon evaluation today patient appears to be doing well with regard to her wound. In fact this appears to be I believe healed although there is still some question as to whether it is completely so. I am concerned about the fact that to be honest she still has some evidence of dry skin that could be just trapping something underneath I still want to debride anything as I am afraid of causing some damage to the good skin for that reason I Georgina Peer probably monitor for 1 more week before completely closing everything out. 08/20/2021 upon evaluation today patient actually appears to be doing excellent. In fact she appears to be  completely healed. This was the case last week as well we just wanted to make sure nothing reopened since she does not really have an ability to put on compression socks this is good to be something we need to make sure was well-healed. Nonetheless I think we have achieved that goal as of today. 12/27; this is a patient we discharged 2 weeks ago with wounds on her left lower leg laterally. She was supposed to get stockings and really did not get them. She developed blistering and reopening probably sometime late last week. She has 3 areas in the same area as previously. The mid calf dimensions on the left went from 39 to 51 cm today. She also has very significant edema on the right leg but as far as I am aware has not had wounds in this area 1/4; patient presents for follow-up. She has tolerated the compression wrap well. She has no issues or complaints today. 09/17/2021 upon evaluation today patient appears to be doing well with regard to her wounds. She has been tolerating the dressing changes without complication. Fortunately there does not appear to be any signs of active infection at this time. No fevers, chills, nausea, vomiting, or diarrhea. 10/01/2021 upon evaluation today patient appears to be doing well with regard to her wound this is actually measuring better and looking smaller and I am very pleased in that regard. Fortunately I do not see any evidence of active infection locally nor systemically at this point. No fevers, chills, nausea, vomiting, or diarrhea. 10/08/2021 upon evaluation patient's wounds are actually showing some signs of improvement measuring a little bit smaller and looking a little bit better. Overall I do not think there is any need for sharp debridement today which is good news. No fevers, chills, nausea, vomiting, or diarrhea. 10/14/2021 upon evaluation today patient appears to be doing well with regard to her wound. She has been tolerating the dressing changes  without complication and overall I am extremely pleased with where things stand today. There is a little bit of film on the surface of the wounds but this was carefully cleaned away with just saline and gauze she really did not want me to do any sharp debridement today. 10/29/2021 upon  evaluation today patient appears to be doing quite well in regard to her wounds. She is actually showing signs of excellent improvement which is great news and overall I feel like we are on the right track at this time. She does have a wound on both legs and she does need something compression wise when she heals for that reason we will get a go and see about ordering her bilateral juxta lite compression wraps. 11/05/2021 upon evaluation today patient appears to be doing well with regard to her wound. She has been tolerating the dressing changes without complication. Fortunately I do not see any evidence of active infection locally nor systemically at this time which is great news. No fevers, chills, nausea, vomiting, or diarrhea. 11/12/2021 upon evaluation today patient appears to be doing well with regard to her wounds. She is doing excellent on the left on the right this is not doing quite as well there is some slough and biofilm buildup. 3/14; patient presents for follow-up. She has no issues or complaints today. She has tolerated the compression wraps well. 11/26/2021 upon evaluation today patient appears to be doing well with regard to her wounds. Both are showing signs of good improvement which is great news. I do not see any evidence of active infection and overall think she is healing quite nicely. 12/03/2021 upon evaluation today patient appears to be doing well with regard to her wounds. Both are showing signs of excellent improvement I am very pleased with where things stand and I think that she is making good progress here. Fortunately I do not see any signs of active infection locally or systemically which is  great news. No fevers, chills, nausea, vomiting, or diarrhea. 12-10-2021 upon evaluation today patient's wounds actually appear to be doing awesome. I am extremely pleased with where we stand I think she is making excellent progress. I do not see any signs of active infection locally or systemically at this time. 12-17-2021 upon evaluation today patient's wounds are actually showing signs of good improvement. Fortunately I do not see any evidence of active infection locally nor systemically which is great news. Overall I do not believe that she is showing any signs of significant worsening which is good news. With that being said I do believe that she has a little bit of film on the surface of the wound I was actually able to clear this away with saline and gauze no sharp debridement was necessary. 12-24-2021 upon evaluation today patient appears to be doing well with regard to her wounds. I do not see any signs of active infection currently which is great news and overall I think that we are on the right track here. No fevers, chills, nausea, vomiting, or diarrhea. 12-31-2021 upon evaluation today patient appears to be doing well with regard to her wounds. Both are showing signs of improvement the wound [name], [name] ([id number]) on the right leg is going require some sharp debridement on the left seems to be doing quite well. 01-07-2022 upon evaluation today patient appears to be doing well currently in regard to her wounds. She is actually making good progress bilaterally. Both of them require little bit of sharp debridement but not too much which is great news. Upon inspection patient's wound bed actually showed signs of good granulation and epithelization on both legs. She still is having significant issues with the right leg compared to the left although I do feel like both are actually showing signs of pretty good improvement. 01-21-2022 upon  evaluation today patient's wound is actually showing  signs of excellent improvement I am very pleased with where we stand currently. I do not see any signs of active infection locally or systemically which is great news. No fevers, chills, nausea, vomiting, or diarrhea. 01-28-2022 upon evaluation today patient appears to be doing well with regard to her wound on the left leg this is pretty much just about healed which is great news. Unfortunately in regard to the wound on her right leg this is not doing nearly as well in fact I think we probably need to see about a culture today that was discussed with her. 02-04-2022 upon evaluation today patient appears to be doing well with regard to her left leg which in fact might actually be completely healed I am not 100% sure. Nonetheless this is shown signs of excellent improvement which is great news. With that being said in regards to the right leg there is some slough and film buildup on the surface of the wound although she is feeling much better than last week this does appear to be doing much better than it was last week. Fortunately there does not appear to be any signs of active infection locally or systemically at this time. 02-11-2022 upon evaluation today patient's wounds appear to be doing decently well. In fact the left leg is healed although there is brand-new skin were definitely given still need to wrap her for the time being. On the right leg this is showing signs of some epithelial growth in the middle of the wound it still measures the same because there is still a larger area with speckled openings but again as far as the entire area being open this is significantly improved which is great news. I am very pleased. 6/13; left leg remains healed and she has a juxta light to apply to it today. There is some concern about whether she is going to be able to do this at home. She lives with a daughter although she has her own disability. On the right the wound looks smaller to me nice epithelialization  good edema control. 02-25-2022 upon evaluation today patient appears to be doing well with regard to her legs the left leg is still healed the right leg is doing much better. I think we are on the right track here. With that being said unfortunately she continues to have significant issues with some other problems and she actually tells me on Saturday she was having lunch and went inside to sit down when she tells me she had all of a sudden a sharp sensation that went from her hand through her arm into her leg and her neck and she tells me that she could not see anything for the next hour it was completely black. Slowly after this her vision started to return and has been normal since. It sounds to me as if she may have had a TIA or mini stroke at this point. Nonetheless I think that she needs to have this checked out ASAP. Her primary care provider is Dr. Clide Deutscher at [facility]. We will get a try to get in touch with him. 03-10-2022 upon evaluation today patient's wound actually showed signs of good improvement. She seems to be making excellent progress here. I do not see any evidence of active infection locally or systemically which is great news. No fevers, chills, nausea, vomiting, or diarrhea. She was in the hospital since have seen her last she ended up finding out that  she was having syncopal episodes. That is what was going on with the blacking out and losing vision. Subsequently they adjusted some of her medication she seems to be doing better. 03-18-2022 upon evaluation today patient's wound is actually showing signs of good granulation and epithelization at this point. Fortunately I do not see any signs of active infection locally or systemically which is great news. 03-25-2022 any evidence of active infection locally or systemically which is great news. Any evidence of active infection which is great news and overall the wound is looking better. Upon evaluation today patient appears to be  doing well with regard to her leg ulcer. She has been tolerating the dressing changes without complication. Fortunately I do not see 7/25; the patient's wound is on the right lateral lower extremity using silver alginate 3 layer compression. She has chronic venous insufficiency and lymphedema. We are using a juxta lite on the left leg which does not have any open wounds but she is leaving this on all week it seems that there just is not a way to get this changed at home Electronic Signature(s) Signed: 04/01/2022 4:13:45 PM By: Linton Ham MD Entered By: Linton Ham on 04/01/2022 14:28:57 [name] ([id number]) -------------------------------------------------------------------------------- Physical Exam Details Patient Name: [name] Date of Service: 04/01/2022 2:00 PM Medical Record Number: [id number] Patient Account Number: [id number] Date of Birth/Sex: 28-Jul-1946 (76 y.o. F) Treating RN: Carlene Coria Primary Care Provider: [name] Other Clinician: Referring Provider: [name] Treating Provider/Extender: Tito Dine in Treatment: 41 Constitutional Patient is hypertensive.. Pulse regular and within target range for patient.Marland Kitchen Respirations regular, non-labored and within target range.. Temperature is normal and within the target range for the patient.Marland Kitchen appears in no distress. Notes Wound exam; right lateral lower leg just above the ankle. She has good granulation and epithelization some slough but no mechanical debridement was felt to be necessary. Her edema control is adequate on the right Electronic Signature(s) Signed: 04/01/2022 4:13:45 PM By: Linton Ham MD Entered By: Linton Ham on 04/01/2022 14:30:00 [name] ([id number]) -------------------------------------------------------------------------------- Physician Orders Details Patient Name: [name] Date of Service: 04/01/2022 2:00 PM Medical Record Number:  [id number] Patient Account Number: [id number] Date of Birth/Sex: Aug 31, 1946 (76 y.o. F) Treating RN: Carlene Coria Primary Care Provider: [name] Other Clinician: Referring Provider: [name] Treating Provider/Extender: Tito Dine in Treatment: 68 Verbal / Phone Orders: No Diagnosis Coding Follow-up Appointments o Return Appointment in 1 week. o Nurse Visit as needed Bathing/ Shower/ Hygiene o May shower with wound dressing protected with water repellent cover or cast protector. o No tub bath. Anesthetic (Use 'Patient Medications' Section for Anesthetic Order Entry) o Lidocaine applied to wound bed Edema Control - Lymphedema / Segmental Compressive Device / Other Bilateral Lower Extremities o Optional: One layer of unna paste to top of compression wrap (to act as an anchor). - she needs this o Patient to wear own Velcro compression garment. Remove compression stockings every night before going to bed and put on every morning when getting up. - left leg o Elevate, Exercise Daily and Avoid Standing for Long Periods of Time. o Elevate legs to the level of the heart and pump ankles as often as possible o Elevate leg(s) parallel to the floor when sitting. o Compression Pump: Use compression pump on left lower extremity for 60 minutes, twice daily. - You may pump over wraps o Compression Pump: Use compression pump on right lower extremity for 60 minutes, twice daily. - You may  pump over wraps Additional Orders / Instructions o Follow Nutritious Diet and Increase Protein Intake Wound Treatment Wound #4 - Lower Leg Wound Laterality: Right, Lateral Cleanser: Soap and Water 1 x Per Week/30 Days Discharge Instructions: Gently cleanse wound with antibacterial soap, rinse and pat dry prior to dressing wounds Cleanser: Wound Cleanser 1 x Per Week/30 Days Discharge Instructions: Wash your hands with soap and water. Remove old dressing, discard into  plastic bag and place into trash. Cleanse the wound with Wound Cleanser prior to applying a clean dressing using gauze sponges, not tissues or cotton balls. Do not scrub or use excessive force. Pat dry using gauze sponges, not tissue or cotton balls. Primary Dressing: Silvercel Small 2x2 (in/in) 1 x Per Week/30 Days Discharge Instructions: Apply Silvercel Small 2x2 (in/in) as instructed Secondary Dressing: ABD Pad 5x9 (in/in) 1 x Per Week/30 Days Discharge Instructions: Cover with ABD pad Compression Wrap: 3-LAYER WRAP - Profore Lite LF 3 Multilayer Compression Bandaging System 1 x Per Week/30 Days Discharge Instructions: Apply 3 multi-layer wrap as prescribed. Compression Stockings: Circaid Juxta Lite Compression Wrap Right Leg Compression Amount: 20-30 mmHG Discharge Instructions: Apply Circaid Juxta Lite Compression Wrap as directed Electronic Signature(s) Signed: 04/01/2022 3:54:37 PM By: Carlene Coria RN Signed: 04/01/2022 4:13:45 PM By: Linton Ham MD Entered By: Carlene Coria on 04/01/2022 14:17:25 [name] ([id number]) -------------------------------------------------------------------------------- Problem List Details Patient Name: [name] Date of Service: 04/01/2022 2:00 PM Medical Record Number: [id number] Patient Account Number: [id number] Date of Birth/Sex: Sep 25, 1945 (76 y.o. 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/07/2022 No Yes exposed Inactive Problems ICD-10 Code  Description Active Date Inactive Date L97.822 Non-pressure chronic ulcer of other part of left lower leg with fat layer exposed 01/16/2021 01/16/2021 Resolved Problems Electronic Signature(s) Signed: 04/01/2022 4:13:45 PM By: Linton Ham MD Entered By: Linton Ham on 04/01/2022 14:27:49 [name] ([id number]) -------------------------------------------------------------------------------- Progress Note Details Patient Name: [name] Date of Service: 04/01/2022 2:00 PM Medical Record Number: [id number] Patient Account Number: [id number] Date of Birth/Sex: 21-Feb-1946 (76 y.o. F) Treating RN: Carlene Coria Primary Care Provider: [name] Other Clinician: Referring Provider: [name] Treating Provider/Extender: Tito Dine in Treatment: 30 Subjective History of Present Illness (HPI) 03/15/2020 upon evaluation today patient presents today for initial evaluation here in our clinic concerning a wound that is on the left posterior lower extremity. Unfortunately this has been giving the patient some discomfort at this point which she notes has been affected her pretty much daily. The patient does have a history of venous insufficiency, hypertension, congestive heart failure, and a history of breast cancer. Fortunately there does not appear to be evidence of active infection at this time which is great news. No fevers, chills, nausea, vomiting, or diarrhea. Wound is not extremely large but does have some slough covering the surface of the wound. This is going require sharp debridement today. 03/15/2020 upon evaluation today patient appears to be doing fairly well in regard to her wound. She does have some slough noted on the surface of the wound currently but I do believe the Iodoflex has been beneficial over the past week.  There is no signs of active infection at this time which is great news and overall very pleased with the progress. No fevers, chills, nausea,  vomiting, or diarrhea. 04/02/2020 upon evaluation today patient appears to be doing a little better in regard to her wound size wise this is not tremendously smaller she does have some slough buildup on the surface of the wound. With that being said this is going require some sharp debridement today to clear away some of the necrotic debris. Fortunately there is no evidence of active infection at this time. No fevers, chills, nausea, vomiting, or diarrhea. 04/10/2020 on evaluation today patient appears to be doing well with regard to her ulcer which is measuring somewhat smaller today. She actually has more granulation tissue noted which is also good news is no need for sharp debridement today. Fortunately there is no evidence of infection either which is also excellent. No fevers, chills, nausea, vomiting, or diarrhea. 04/26/2020 on evaluation today patient's wound actually is showing signs of good improvement which is great news. There does not appear to be any evidence of active infection. I do believe she is tolerating the collagen at this point. 05/03/2020 upon evaluation today patient's wound actually showed signs of good granulation at this time there does not appear to be any evidence of active infection which is great news and overall very pleased with where things stand. With that being said I do believe that the patient is can require some sharp debridement today but fortunately nothing too significant. 05/11/20 on evaluation today patient appears to be doing well in regard to her leg ulcer. She's been tolerating the dressing changes without complication. Fortunately there is no signs of active infection at this time. Overall I'm very pleased with where things stand. 05/18/2020 upon evaluation today patient's wound is showing signs of improvement is very dry and I think the collagen for the most part just dried to the wound bed. I am going to clean this away with sharp debridement in order to get  this under control in my opinion. 05/25/2020 upon evaluation today patient actually appears to be doing well in regard to her leg ulcer. In fact upon inspection it appears she could potentially be completely healed but that is not guaranteed based on what we are seeing. There does not appear to be any signs of active infection which is great news. 05/31/2020 on evaluation today patient appears to be doing well with regard to her wounds. She in fact appears to be almost completely healed on the left leg there is just a very small opening remaining point 1 06/08/2020 upon evaluation today patient appears to be doing excellent in regard to her leg ulcer on the left in fact it appears to be that she is completely healed as of today. I see no signs of active infection at this time which is great news. No fevers, chills, nausea, vomiting, or diarrhea. READMISSION 01/16/2021 Patient that we have had in this clinic with a wound on the left posterior calf in the summer 2021 extending into October. She was felt to have chronic venous insufficiency. Per the patient's description she was discharged with what sounds like external compression stockings although she could not afford the "$75 per leg". She therefore did not get anything. Apparently her wound that she has currently is been in existence since January. She has been followed at vein and vascular with Unna boots and I think calcium alginate. She had ABIs done on 06/25/2020 that showed  an noncompressible ABI on the right leg and the left leg but triphasic waveforms with good great toe pressures and a TBI of 0.90 on the right and 0.91 on the left Surprisingly the patient also had venous reflux studies that were really unremarkable. This included no evidence of DVTs or SVTs but there was no evidence of deep venous insufficiency or superficial venous insufficiency in the greater saphenous or short saphenous veins bilaterally. I would wonder about more central  venous issues or perhaps this is all lymphedema 01/25/2021 upon evaluation today patient appears to be doing decently well and guard to her wound. The good news is the Iodoflex that is job she saw Dr. Dellia Nims last week for readmission and to be honest I think that this has done extremely well over that week. I do believe that she can tolerate the sharp debridement today which will be good. I think that cleaning off the wound will likely allow [place] to be able to use a different type of dressing I do not think the Iodoflex will even be necessary based on how clean the wound looks. Fortunately there is no sign of active infection at this time which is great news. No fevers, chills, nausea, vomiting, or diarrhea. 02/12/2021 upon evaluation today patient appears to be doing well with regard to her leg ulcer. We did switch to alginate when she came for nurse visit I think is doing much better for her. Fortunately there does not appear to be any signs of active infection at this time which is great news. No fevers, chills, nausea, vomiting, or diarrhea. [name], [name] ([id number]) 02/18/2021 upon evaluation today patient's wound is actually showing signs of good improvement. I am very pleased with where things stand currently. I do not see any signs of active infection which is great news and overall I feel like she is making good progress. The patient likewise is happy that things are doing so well and that this is measuring somewhat smaller. 02/25/2021 upon evaluation today patient actually appears to be doing quite well in regard to her wounds. She has been tolerating the dressing changes without complication. Fortunately there does not appear to be any signs of active infection which is great news and overall I am extremely pleased with where things stand. No fevers, chills, nausea, vomiting, or diarrhea. She does have a lot of edema I really do think she needs compression socks to be worn in order to prevent  things from causing additional issues for her currently. Especially on the right leg she should be wearing compression socks all the time to be honest. 03/04/2021 upon evaluation today patient's leg ulcer actually appears to be doing pretty well which is great news. There does not appear to be any significant change but overall the appearance is better even though the size is not necessarily reflecting a great improvement. Fortunately there does not appear to be any signs of active infection. No fevers, chills, nausea, vomiting, or diarrhea. 03/12/2021 upon evaluation today patient appears to be doing well with regard to her wounds. She has been tolerating the dressing changes without complication. The good news is that the wound on her left lateral leg is doing much better and is showing signs of good improvement. Overall her swelling is controlled well as well. She does need some compression socks she plans to go Jefferey socks next week. 03/18/2021 on evaluation today patient's wound actually is showing signs of good improvement. She does have a little bit of slough this  can require some sharp debridement today. 03/25/2021 upon evaluation today patient appears to be doing well with regard to her wound on the left lateral leg. She has been tolerating the dressing changes without complication. Fortunately there does not appear to be any signs of active infection at this time. No fever chills no 04/01/2021 upon evaluation today patient's wound is showing signs of improvement and overall very pleased with where things stand at this point. There is no evidence of active infection which is great news as well and in general I think that she is making great progress. 04/09/2021 upon evaluation today patient appears to be doing well with regard to her ankle ulcer. She is making good progress currently which is great news. There does not appear to be any signs of infection also excellent news. In general I am  extremely pleased with where things stand today. No fevers, chills, nausea, vomiting, or diarrhea. 04/23/2021 upon inspection today patient appears to be doing quite well with regard to her leg ulcer. She has been tolerating the dressing changes without complication. She is can require some sharp debridement today but overall seems to be doing excellent. 05/06/2021 upon evaluation today patient unfortunately appears to be doing poorly overall in regard to her swelling. She is actually significantly more swollen than last week despite the fact that her wound is doing better this is not a good trend. I think that she probably needs to see her cardiologist ASAP and I discussed that with her today. This includes the fact that honestly I think she probably is getting need an increase in her diuretic or something to try to clear away some of the excess fluid that she is experiencing at the moment. She is not been doing her pumps even twice a day but again right now more concerned that if she were to do the pump she would fluid overload her lungs causing other issues as far as her breathing is concerned. Obviously I think she needs to contact them and move up the appointment for the 12th of something sooner 2 weeks is a bit too long to wait with how swollen she has. 05/14/2021 upon evaluation today patient's wound actually showing signs of being a little bit smaller compared to previous. She has been making good progress however. Fortunately there does not appear to be any evidence of active infection which is great. Overall I am extremely pleased in that regard. Nonetheless I am still concerned that she is having quite a bit of issue as far as the swelling is concerned she has been placed on fluid restriction as well as an increase in the furosemide by her doctor. We will see how things progress. 05/21/2021 upon evaluation today patient appears to be doing well with regard to her wound in general. She has been  tolerating the dressing changes without complication. With that being said she has been using silver cell for a while and while this was showing signs of improvement it is now gotten to the point where this has slowed down quite significantly. I do believe that switching to a different dressing may be beneficial for her today. 05/28/2021 upon evaluation today patient actually appears to be doing quite well in regard to her wound. In fact there is really not any need for sharp debridement today based on what I see. The Hydrofera Blue is doing great and overall I think that she is managing quite nicely with the compression wrapping. Her edema is still down quite a  bit with the wrapping in place. 06/11/2021 upon evaluation today patient appears to be doing well with regard to her leg ulcer. She has been tolerating the dressing changes without complication. Fortunately there does not appear to be any signs of active infection at this time which is great news. No fevers, chills, nausea, vomiting, or diarrhea. She is going require some sharp debridement today. 10/11; left leg ulcer much better looking and with improved surface area. She is using Hydrofera Blue under 3 layer compression. She does not have any stocking on the right leg which in itself has very significant nonpitting edema 06/25/2021 upon evaluation today patient appears to be doing well with regard to the wound on her left leg. She has been tolerating the dressing changes without complication and overall I am extremely pleased with where things stand today. I do not see any evidence of infection which is great news. 07/02/2021 upon evaluation today patient's wound actually showing signs of excellent improvement and actually very pleased with where we stand today and the overall appearance. Fortunately there does not appear to be any signs of active infection. No fevers, chills, nausea, vomiting, or diarrhea. 07/09/2021 upon evaluation today  patient appears to be doing better in regard to her wound this is measuring smaller and she is headed in the appropriate direction based on what I am seeing currently. I do not see any evidence of active infection systemically which is great news. 07/16/2021 upon evaluation today patient appears to be doing pretty well currently in regard to her leg ulcer. This is measuring smaller and looking much better this is great news. Fortunately there does not appear to be any evidence of active infection systemically which is great news as well. 07/23/2021 upon evaluation today patient's wound is actually showing signs of good improvement this is definitely measuring smaller. I do not see any signs of infection which is great news and overall very pleased in that regard. Overall I think that she is doing well with the [facility]. [name], [name] ([id number]) 07/30/2020 upon evaluation today patient appears to be doing well in regard to her wound. She has been tolerating the dressing changes without complication think the Hydrofera Blue at this point is actually causing her to dry out a lot as far as the wound bed is concerned. Subsequently I think that we need to try to see what we can do to improve this. Fortunately there does not appear to be any evidence of active infection at this time which is great news. 08/06/2021 upon evaluation today patient appears to be doing well with regards to her wound. Is not measuring significantly smaller initial inspection but upon closer inspection she has a lot of new skin growth across the central portion of the wound. I think will get very close to complete resolution. 08/13/2021 upon evaluation today patient appears to be doing well with regard to her wound. In fact this appears to be I believe healed although there is still some question as to whether it is completely so. I am concerned about the fact that to be honest she still has some evidence of dry skin  that could be just trapping something underneath I still want to debride anything as I am afraid of causing some damage to the good skin for that reason I Georgina Peer probably monitor for 1 more week before completely closing everything out. 08/20/2021 upon evaluation today patient actually appears to be doing excellent. In fact she appears to be completely healed. This  was the case last week as well we just wanted to make sure nothing reopened since she does not really have an ability to put on compression socks this is good to be something we need to make sure was well-healed. Nonetheless I think we have achieved that goal as of today. 12/27; this is a patient we discharged 2 weeks ago with wounds on her left lower leg laterally. She was supposed to get stockings and really did not get them. She developed blistering and reopening probably sometime late last week. She has 3 areas in the same area as previously. The mid calf dimensions on the left went from 39 to 51 cm today. She also has very significant edema on the right leg but as far as I am aware has not had wounds in this area 1/4; patient presents for follow-up. She has tolerated the compression wrap well. She has no issues or complaints today. 09/17/2021 upon evaluation today patient appears to be doing well with regard to her wounds. She has been tolerating the dressing changes without complication. Fortunately there does not appear to be any signs of active infection at this time. No fevers, chills, nausea, vomiting, or diarrhea. 10/01/2021 upon evaluation today patient appears to be doing well with regard to her wound this is actually measuring better and looking smaller and I am very pleased in that regard. Fortunately I do not see any evidence of active infection locally nor systemically at this point. No fevers, chills, nausea, vomiting, or diarrhea. 10/08/2021 upon evaluation patient's wounds are actually showing some signs of improvement  measuring a little bit smaller and looking a little bit better. Overall I do not think there is any need for sharp debridement today which is good news. No fevers, chills, nausea, vomiting, or diarrhea. 10/14/2021 upon evaluation today patient appears to be doing well with regard to her wound. She has been tolerating the dressing changes without complication and overall I am extremely pleased with where things stand today. There is a little bit of film on the surface of the wounds but this was carefully cleaned away with just saline and gauze she really did not want me to do any sharp debridement today. 10/29/2021 upon evaluation today patient appears to be doing quite well in regard to her wounds. She is actually showing signs of excellent improvement which is great news and overall I feel like we are on the right track at this time. She does have a wound on both legs and she does need something compression wise when she heals for that reason we will get a go and see about ordering her bilateral juxta lite compression wraps. 11/05/2021 upon evaluation today patient appears to be doing well with regard to her wound. She has been tolerating the dressing changes without complication. Fortunately I do not see any evidence of active infection locally nor systemically at this time which is great news. No fevers, chills, nausea, vomiting, or diarrhea. 11/12/2021 upon evaluation today patient appears to be doing well with regard to her wounds. She is doing excellent on the left on the right this is not doing quite as well there is some slough and biofilm buildup. 3/14; patient presents for follow-up. She has no issues or complaints today. She has tolerated the compression wraps well. 11/26/2021 upon evaluation today patient appears to be doing well with regard to her wounds. Both are showing signs of good improvement which is great news. I do not see any evidence of active infection  and overall think she is  healing quite nicely. 12/03/2021 upon evaluation today patient appears to be doing well with regard to her wounds. Both are showing signs of excellent improvement I am very pleased with where things stand and I think that she is making good progress here. Fortunately I do not see any signs of active infection locally or systemically which is great news. No fevers, chills, nausea, vomiting, or diarrhea. 12-10-2021 upon evaluation today patient's wounds actually appear to be doing awesome. I am extremely pleased with where we stand I think she is making excellent progress. I do not see any signs of active infection locally or systemically at this time. 12-17-2021 upon evaluation today patient's wounds are actually showing signs of good improvement. Fortunately I do not see any evidence of active infection locally nor systemically which is great news. Overall I do not believe that she is showing any signs of significant worsening which is good news. With that being said I do believe that she has a little bit of film on the surface of the wound I was actually able to clear this away with saline and gauze no sharp debridement was necessary. 12-24-2021 upon evaluation today patient appears to be doing well with regard to her wounds. I do not see any signs of active infection currently which is great news and overall I think that we are on the right track here. No fevers, chills, nausea, vomiting, or diarrhea. 12-31-2021 upon evaluation today patient appears to be doing well with regard to her wounds. Both are showing signs of improvement the wound on the right leg is going require some sharp debridement on the left seems to be doing quite well. 01-07-2022 upon evaluation today patient appears to be doing well currently in regard to her wounds. She is actually making good progress bilaterally. Both of them require little bit of sharp debridement but not too much which is great news. [place], [name]  ([id number]) Upon inspection patient's wound bed actually showed signs of good granulation and epithelization on both legs. She still is having significant issues with the right leg compared to the left although I do feel like both are actually showing signs of pretty good improvement. 01-21-2022 upon evaluation today patient's wound is actually showing signs of excellent improvement I am very pleased with where we stand currently. I do not see any signs of active infection locally or systemically which is great news. No fevers, chills, nausea, vomiting, or diarrhea. 01-28-2022 upon evaluation today patient appears to be doing well with regard to her wound on the left leg this is pretty much just about healed which is great news. Unfortunately in regard to the wound on her right leg this is not doing nearly as well in fact I think we probably need to see about a culture today that was discussed with her. 02-04-2022 upon evaluation today patient appears to be doing well with regard to her left leg which in fact might actually be completely healed I am not 100% sure. Nonetheless this is shown signs of excellent improvement which is great news. With that being said in regards to the right leg there is some slough and film buildup on the surface of the wound although she is feeling much better than last week this does appear to be doing much better than it was last week. Fortunately there does not appear to be any signs of active infection locally or systemically at this time. 02-11-2022 upon evaluation today patient's wounds appear  to be doing decently well. In fact the left leg is healed although there is brand-new skin were definitely given still need to wrap her for the time being. On the right leg this is showing signs of some epithelial growth in the middle of the wound it still measures the same because there is still a larger area with speckled openings but again as far as the entire area being open this  is significantly improved which is great news. I am very pleased. 6/13; left leg remains healed and she has a juxta light to apply to it today. There is some concern about whether she is going to be able to do this at home. She lives with a daughter although she has her own disability. On the right the wound looks smaller to me nice epithelialization good edema control. 02-25-2022 upon evaluation today patient appears to be doing well with regard to her legs the left leg is still healed the right leg is doing much better. I think we are on the right track here. With that being said unfortunately she continues to have significant issues with some other problems and she actually tells me on Saturday she was having lunch and went inside to sit down when she tells me she had all of a sudden a sharp sensation that went from her hand through her arm into her leg and her neck and she tells me that she could not see anything for the next hour it was completely black. Slowly after this her vision started to return and has been normal since. It sounds to me as if she may have had a TIA or mini stroke at this point. Nonetheless I think that she needs to have this checked out ASAP. Her primary care provider is Dr. Clide Deutscher at [facility]. We will get a try to get in touch with him. 03-10-2022 upon evaluation today patient's wound actually showed signs of good improvement. She seems to be making excellent progress here. I do not see any evidence of active infection locally or systemically which is great news. No fevers, chills, nausea, vomiting, or diarrhea. She was in the hospital since have seen her last she ended up finding out that she was having syncopal episodes. That is what was going on with the blacking out and losing vision. Subsequently they adjusted some of her medication she seems to be doing better. 03-18-2022 upon evaluation today patient's wound is actually showing signs of good granulation and  epithelization at this point. Fortunately I do not see any signs of active infection locally or systemically which is great news. 03-25-2022 any evidence of active infection locally or systemically which is great news. Any evidence of active infection which is great news and overall the wound is looking better. Upon evaluation today patient appears to be doing well with regard to her leg ulcer. She has been tolerating the dressing changes without complication. Fortunately I do not see 7/25; the patient's wound is on the right lateral lower extremity using silver alginate 3 layer compression. She has chronic venous insufficiency and lymphedema. We are using a juxta lite on the left leg which does not have any open wounds but she is leaving this on all week it seems that there just is not a way to get this changed at home Objective Constitutional Patient is hypertensive.. Pulse regular and within target range for patient.Marland Kitchen Respirations regular, non-labored and within target range.. Temperature is normal and within the target range for the patient.Marland Kitchen  appears in no distress. Vitals Time Taken: 2:00 PM, Height: 69 in, Weight: 244 lbs, BMI: 36, Temperature: 98.7 F, Pulse: 64 bpm, Respiratory Rate: 18 breaths/min, Blood Pressure: 168/80 mmHg. General Notes: Wound exam; right lateral lower leg just above the ankle. She has good granulation and epithelization some slough but no mechanical debridement was felt to be necessary. Her edema control is adequate on the right Integumentary (Hair, Skin) Wound #4 status is Open. Original cause of wound was Gradually Appeared. The date acquired was: 10/27/2021. The wound has been in treatment 22 weeks. The wound is located on the Right,Lateral Lower Leg. The wound measures 5cm length x 5.5cm width x 0.1cm depth; 21.598cm^2 area and 2.16cm^3 volume. There is no tunneling or undermining noted. There is a medium amount of serosanguineous drainage noted. There is medium  (34-66%) pink granulation within the wound bed. There is a medium (34-66%) amount of necrotic tissue within the wound bed including Adherent Slough. 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 There was a Three Layer Compression Therapy Procedure by Carlene Coria, RN. Post procedure Diagnosis Wound #4: Same as Pre-Procedure Plan Follow-up Appointments: Return Appointment in 1 week. Nurse Visit as needed Bathing/ Shower/ Hygiene: May shower with wound dressing protected with water repellent cover or cast protector. No tub bath. Anesthetic (Use 'Patient Medications' Section for Anesthetic Order Entry): Lidocaine applied to wound bed Edema Control - Lymphedema / Segmental Compressive Device / Other: Optional: One layer of unna paste to top of compression wrap (to act as an anchor). - she needs this Patient to wear own Velcro compression garment. Remove compression stockings every night before going to bed and put on every morning when getting up. - left leg Elevate, Exercise Daily and Avoid Standing for Long Periods of Time. Elevate legs to the level of the heart and pump ankles as often as possible Elevate leg(s) parallel to the floor when sitting. Compression Pump: Use compression pump on left lower extremity for 60 minutes, twice daily. - You may pump over wraps Compression Pump: Use compression pump on right lower extremity for 60 minutes, twice daily. - You may pump over wraps Additional Orders / Instructions: Follow Nutritious Diet and Increase Protein Intake WOUND #4: - Lower Leg Wound Laterality: Right, Lateral Cleanser: Soap and  Water 1 x Per Week/30 Days Discharge Instructions: Gently cleanse wound with antibacterial soap, rinse and pat dry prior to dressing wounds Cleanser: Wound Cleanser 1 x Per Week/30 Days Discharge Instructions: Wash your hands with soap and water. Remove old dressing, discard into plastic bag and place into trash. Cleanse the wound with Wound Cleanser prior to applying a clean dressing using gauze sponges, not tissues or cotton balls. Do not scrub or use excessive force. Pat dry using gauze sponges, not tissue or cotton balls. Primary Dressing: Silvercel Small 2x2 (in/in) 1 x Per Week/30 Days Discharge Instructions: Apply Silvercel Small 2x2 (in/in) as instructed Secondary Dressing: ABD Pad 5x9 (in/in) 1 x Per Week/30 Days Discharge Instructions: Cover with ABD pad Compression Wrap: 3-LAYER WRAP - Profore Lite LF 3 Multilayer Compression Bandaging System 1 x Per Week/30 Days Discharge Instructions: Apply 3 multi-layer wrap as prescribed. Compression Stockings: Circaid Juxta Lite Compression Wrap Compression Amount:  20-30 mmHg (right) Discharge Instructions: Apply Circaid Juxta Lite Compression Wrap as directed 1. I did not change the primary dressing 2. It looks as though the area on the right lower leg is progressing towards healing 3. Juxta lite applied on the left lower leg [place], [name] ([id number]) 4. The bigger problem is trying to see how we will maintain skin integrity after the area on the right is healed she just does not seem to have anybody to change juxta lites reliably Electronic Signature(s) Signed: 04/01/2022 4:13:45 PM By: Linton Ham MD Entered By: Linton Ham on 04/01/2022 14:30:51 [name] ([id number]) -------------------------------------------------------------------------------- [place] Details Patient Name: [name] Date of Service: 04/01/2022 Medical Record Number: [id number] Patient Account Number: [id number] Date of Birth/Sex:  July 17, 1946 (76 y.o. F) Treating RN: Carlene Coria Primary Care Provider: [name] Other Clinician: Referring Provider: [name] Treating Provider/Extender: Tito Dine in Treatment: 62 Diagnosis Coding ICD-10 Codes Code Description I89.0 Lymphedema, not elsewhere classified I87.332 Chronic venous hypertension (idiopathic) with ulcer and inflammation of left lower extremity L97.812 Non-pressure chronic ulcer of other part of right lower leg with fat layer exposed Facility Procedures CPT4 Code: [id number] Description: (Facility Use Only) [phone] - [place] L[facility] RT LEG Modifier: Quantity: 1 Physician Procedures CPT4 Code Description: [id number] - WC PHYS LEVEL 3 - EST PT Modifier: Quantity: 1 CPT4 Code Description: ICD-10 Diagnosis Description O06.004 Non-pressure chronic ulcer of other part of right lower leg with fat lay I87.332 Chronic venous hypertension (idiopathic) with ulcer and inflammation of I89.0 Lymphedema, not elsewhere  classified Modifier: er exposed left lower extremity Quantity: Electronic Signature(s) Signed: 04/01/2022 4:13:45 PM By: Linton Ham MD Entered By: Linton Ham on 04/01/2022 14:31:12

## 2022-04-01 NOTE — Progress Notes (Signed)
[name], [name] ([id number]) Visit Report for 04/01/2022 Arrival Information Details Patient Name: [name] Date of Service: 04/01/2022 2:00 PM Medical Record Number: [id number] Patient Account Number: [id number] Date of Birth/Sex: [birth year] (76 y.o. F) Treating RN: [name] Primary Care [name]: [name] Other Clinician: Referring [name]: [name] Treating [name]/Extender: [name] in Treatment: 48 Visit Information History Since Last Visit All ordered tests and consults were completed: No Patient Arrived: Wheel Chair Added or deleted any medications: No Arrival Time: 13:56 Any new allergies or adverse reactions: No Accompanied By: self Had a fall or experienced change in No Transfer Assistance: None activities of daily living that may affect Patient Identification Verified: Yes risk of falls: Secondary Verification Process Completed: Yes Signs or symptoms of abuse/neglect since last visito No Patient Requires Transmission-Based Precautions: No Hospitalized since last visit: No Patient Has Alerts: Yes Implantable device outside of the clinic excluding No Patient Alerts: NOT DIABETIC cellular tissue based products placed in the center since last visit: Has Dressing in Place as Prescribed: Yes Has Compression in Place as Prescribed: Yes Pain Present Now: No Electronic Signature(s) Signed: 04/01/2022 3:54:37 PM By: [name] Coria RN Entered By: [name] on 04/01/2022 14:03:55 [name] ([id number]) -------------------------------------------------------------------------------- Clinic Level of Care Assessment Details Patient Name: [name] Date of Service: 04/01/2022 2:00 PM Medical Record Number: [id number] Patient Account Number: [id number] Date of Birth/Sex: 1946/07/28 (76 y.o. F) Treating RN: [name] Primary Care [name]: [name] Other Clinician: Referring [name]: [name] Treating  [name]/Extender: [name] in Treatment: 45 Clinic Level of Care Assessment Items TOOL 1 Quantity Score '[]'$  - Use when EandM and Procedure is performed on INITIAL visit 0 ASSESSMENTS - Nursing Assessment / Reassessment '[]'$  - General Physical Exam (combine w/ comprehensive assessment (listed just below) when performed on new 0 pt. evals) '[]'$  - 0 Comprehensive Assessment (HX, ROS, Risk Assessments, Wounds Hx, etc.) ASSESSMENTS - Wound and Skin Assessment / Reassessment '[]'$  - Dermatologic / Skin Assessment (not related to wound area) 0 ASSESSMENTS - Ostomy and/or Continence Assessment and Care '[]'$  - Incontinence Assessment and Management 0 '[]'$  - 0 Ostomy Care Assessment and Management (repouching, etc.) PROCESS - Coordination of Care '[]'$  - Simple Patient / Family Education for ongoing care 0 '[]'$  - 0 Complex (extensive) Patient / Family Education for ongoing care '[]'$  - 0 Staff obtains Programmer, systems, Records, Test Results / Process Orders '[]'$  - 0 Staff telephones HHA, Nursing Homes / Clarify orders / etc '[]'$  - 0 Routine Transfer to another Facility (non-emergent condition) '[]'$  - 0 Routine Hospital Admission (non-emergent condition) '[]'$  - 0 New Admissions / Biomedical engineer / Ordering NPWT, Apligraf, etc. '[]'$  - 0 Emergency Hospital Admission (emergent condition) PROCESS - Special Needs '[]'$  - Pediatric / Minor Patient Management 0 '[]'$  - 0 Isolation Patient Management '[]'$  - 0 Hearing / Language / Visual special needs '[]'$  - 0 Assessment of Community assistance (transportation, D/C planning, etc.) '[]'$  - 0 Additional assistance / Altered mentation '[]'$  - 0 Support Surface(s) Assessment (bed, cushion, seat, etc.) INTERVENTIONS - Miscellaneous '[]'$  - External ear exam 0 '[]'$  - 0 Patient Transfer (multiple staff / Civil Service fast streamer / Similar devices) '[]'$  - 0 Simple Staple / Suture removal (25 or less) '[]'$  - 0 Complex Staple / Suture removal (26 or more) '[]'$  - 0 Hypo/Hyperglycemic Management  (do not check if billed separately) '[]'$  - 0 Ankle / Brachial Index (ABI) - do not check if billed separately Has the patient been seen at the hospital  within the last three years: Yes Total Score: 0 Level Of Care: ____ [name] ([id number]) Electronic Signature(s) Signed: 04/01/2022 3:54:37 PM By: [name] Coria RN Entered By: [name] on 04/01/2022 14:19:43 [name] ([id number]) -------------------------------------------------------------------------------- Compression Therapy Details Patient Name: [name] Date of Service: 04/01/2022 2:00 PM Medical Record Number: [id number] Patient Account Number: [id number] Date of Birth/Sex: 01-29-1946 (76 y.o. F) Treating RN: [name] Primary Care [name]: [name] Other Clinician: Referring [name]: [name] Treating [name]/Extender: [name] in Treatment: 28 Compression Therapy Performed for Wound Assessment: Wound #4 Right,Lateral Lower Leg Performed By: Clinician [name] Coria, RN Compression Type: Three Layer Post Procedure Diagnosis Same as Pre-procedure Electronic Signature(s) Signed: 04/01/2022 3:54:37 PM By: [name] Coria RN Entered By: [name] on 04/01/2022 14:19:31 [name] ([id number]) -------------------------------------------------------------------------------- Encounter Discharge Information Details Patient Name: [name] Date of Service: 04/01/2022 2:00 PM Medical Record Number: [id number] Patient Account Number: [id number] Date of Birth/Sex: 06/14/1946 (76 y.o. F) Treating RN: [name] Primary Care [name]: [name] Other Clinician: Referring [name]: [name] Treating [name]/Extender: [name] in Treatment: 58 Encounter Discharge Information Items Discharge Condition: Stable Ambulatory Status: Wheelchair Discharge Destination: Home Transportation: Private Auto Accompanied By: self Schedule Follow-up  Appointment: Yes Clinical Summary of Care: Electronic Signature(s) Signed: 04/01/2022 3:54:37 PM By: [name] Coria RN Entered By: [name] on 04/01/2022 14:21:06 [name] ([id number]) -------------------------------------------------------------------------------- Lower Extremity Assessment Details Patient Name: [name] Date of Service: 04/01/2022 2:00 PM Medical Record Number: [id number] Patient Account Number: [id number] Date of Birth/Sex: 20-Feb-1946 (76 y.o. F) Treating RN: [name] Primary Care Estus Krakowski: [name] Other Clinician: Referring Meloni Hinz: [name] Treating Aranza Geddes/Extender: [name] in Treatment: 62 Edema Assessment Assessed: [Left: No] [Right: No] [Left: Edema] [Right: :] Calf Left: Right: Point of Measurement: 31 cm From Medial Instep 55 cm Ankle Left: Right: Point of Measurement: 10 cm From Medial Instep 25 cm Vascular Assessment Pulses: Dorsalis Pedis Palpable: [Right:Yes] Electronic Signature(s) Signed: 04/01/2022 3:54:37 PM By: [name] Coria RN Entered By: [name] on 04/01/2022 14:11:57 [name] ([id number]) -------------------------------------------------------------------------------- Multi Wound Chart Details Patient Name: [name] Date of Service: 04/01/2022 2:00 PM Medical Record Number: [id number] Patient Account Number: [id number] Date of Birth/Sex: [birth year] (76 y.o. F) Treating RN: [name] Primary Care Lavada Langsam: [name] Other Clinician: Referring Mariah Gerstenberger: [name] Treating Larue Lightner/Extender: [name] in Treatment: 55 Vital Signs Height(in): 69 Pulse(bpm): 70 Weight(lbs): 244 Blood Pressure(mmHg): 168/80 Body Mass Index(BMI): 36 Temperature(F): 98.7 Respiratory Rate(breaths/min): 18 Photos: [N/A:N/A] Wound Location: Right, Lateral Lower Leg N/A N/A Wounding Event: Gradually Appeared N/A N/A Primary Etiology: Venous Leg Ulcer N/A  N/A Comorbid History: Cataracts, Lymphedema, Sleep N/A N/A Apnea, Congestive Heart Failure, Hypertension, Osteoarthritis, Neuropathy, Received Chemotherapy, Received Radiation Date Acquired: 10/27/2021 N/A N/A Weeks of Treatment: 22 N/A N/A Wound Status: Open N/A N/A Wound Recurrence: No N/A N/A Measurements L x W x D (cm) 5x5.5x0.1 N/A N/A Area (cm) : 21.598 N/A N/A Volume (cm) : 2.16 N/A N/A % Reduction in Area: -1733.40% N/A N/A % Reduction in Volume: -1730.50% N/A N/A Classification: Full Thickness Without Exposed N/A N/A Support Structures Exudate Amount: Medium N/A N/A Exudate Type: Serosanguineous N/A N/A Exudate Color: red, brown N/A N/A Granulation Amount: Medium (34-66%) N/A N/A Granulation Quality: Pink N/A N/A Necrotic Amount: Medium (34-66%) N/A N/A Exposed Structures: Fascia: No N/A N/A Fat Layer (Subcutaneous Tissue): No Tendon: No Muscle: No Joint: No Bone: No Epithelialization: None N/A N/A Treatment Notes Electronic Signature(s) Signed: 04/01/2022  3:54:37 PM By: [name] Coria RN Entered By: [name] on 04/01/2022 14:12:23 ZEBA, LUBY ([id number]) Wynetta Emery, [name] ([id number]) -------------------------------------------------------------------------------- Multi-Disciplinary Care Plan Details Patient Name: [name] Date of Service: 04/01/2022 2:00 PM Medical Record Number: [id number] Patient Account Number: [id number] Date of Birth/Sex: 04/24/1946 (76 y.o. F) Treating RN: [name] Primary Care Isley Weisheit: [name] Other Clinician: Referring Kc Summerson: [name] Treating Shakinah Navis/Extender: Ricard Dillon Weeks in Treatment: 56 Active Inactive Electronic Signature(s) Signed: 04/01/2022 3:54:37 PM By: [name] Coria RN Entered By: [name] on 04/01/2022 14:12:14 [name] ([id number]) -------------------------------------------------------------------------------- Pain Assessment Details Patient Name: [name] Date of Service: 04/01/2022 2:00 PM Medical Record Number: [id number] Patient Account Number: [id number] Date of Birth/Sex: 1945/11/22 (76 y.o. F) Treating RN: [name] Primary Care Nara Paternoster: [name] Other Clinician: Referring Danahi Reddish: [name] Treating Caileigh Canche/Extender: [name] in Treatment: 63 Active Problems Location of Pain Severity and Description of Pain Patient Has Paino No Site Locations Pain Management and Medication Current Pain Management: Electronic Signature(s) Signed: 04/01/2022 3:54:37 PM By: [name] Coria RN Entered By: [name] on 04/01/2022 14:04:28 [name] ([id number]) -------------------------------------------------------------------------------- Patient/Caregiver Education Details Patient Name: [name] Date of Service: 04/01/2022 2:00 PM Medical Record Number: [id number] Patient Account Number: [id number] Date of Birth/Gender: 08/14/1946 (76 y.o. F) Treating RN: [name] Primary Care Physician: [name] Other Clinician: Referring Physician: Tomasa [name] Treating Physician/Extender: [name] in Treatment: 6 Education Assessment Education Provided To: Patient Education Topics Provided Wound/Skin Impairment: Methods: Explain/Verbal Responses: State content correctly Electronic Signature(s) Signed: 04/01/2022 3:54:37 PM By: [name] Coria RN Entered By: [name] on 04/01/2022 14:20:14 [name] ([id number]) -------------------------------------------------------------------------------- Wound Assessment Details Patient Name: [name] Date of Service: 04/01/2022 2:00 PM Medical Record Number: [id number] Patient Account Number: [id number] Date of Birth/Sex: [birth year] (76 y.o. F) Treating RN: [name] Primary Care Austyn Perriello: [name] Other Clinician: Referring Jyl Chico: [name] Treating Lamont Tant/Extender: [name] in Treatment:  62 Wound Status Wound Number: 4 Primary Venous Leg Ulcer Etiology: Wound Location: Right, Lateral Lower Leg Wound Open Wounding Event: Gradually Appeared Status: Date Acquired: 10/27/2021 Comorbid Cataracts, Lymphedema, Sleep Apnea, Congestive Heart Weeks Of Treatment: 22 History: Failure, Hypertension, Osteoarthritis, Neuropathy, Clustered Wound: No Received Chemotherapy, Received Radiation Photos Wound Measurements Length: (cm) 5 Width: (cm) 5.5 Depth: (cm) 0.1 Area: (cm) 21.598 Volume: (cm) 2.16 % Reduction in Area: -1733.4% % Reduction in Volume: -1730.5% Epithelialization: None Tunneling: No Undermining: No Wound Description Classification: Full Thickness Without Exposed Support Structures Exudate Amount: Medium Exudate Type: Serosanguineous Exudate Color: red, brown Foul Odor After Cleansing: No Slough/Fibrino Yes Wound Bed Granulation Amount: Medium (34-66%) Exposed Structure Granulation Quality: Pink Fascia Exposed: No Necrotic Amount: Medium (34-66%) Fat Layer (Subcutaneous Tissue) Exposed: No Necrotic Quality: Adherent Slough Tendon Exposed: No Muscle Exposed: No Joint Exposed: No Bone Exposed: No Treatment Notes Wound #4 (Lower Leg) Wound Laterality: Right, Lateral Cleanser Soap and Water Discharge Instruction: Gently cleanse wound with antibacterial soap, rinse and pat dry prior to dressing wounds Wound Cleanser LAVINE, HARGROVE ([id number]) Discharge Instruction: Wash your hands with soap and water. Remove old dressing, discard into plastic bag and place into trash. Cleanse the wound with Wound Cleanser prior to applying a clean dressing using gauze sponges, not tissues or cotton balls. Do not scrub or use excessive force. Pat dry using gauze sponges, not tissue or cotton balls. Peri-Wound Care Topical Primary Dressing Silvercel Small 2x2 (in/in) Discharge Instruction: Apply Silvercel Small 2x2 (in/in) as instructed Secondary Dressing ABD Pad  5x9 (in/in) Discharge Instruction: Cover with ABD pad Secured With Compression Wrap 3-LAYER WRAP - Profore Lite LF 3 Multilayer Compression Bandaging System Discharge Instruction: Apply 3 multi-layer wrap as prescribed. Compression Stockings Circaid Juxta Lite Compression Wrap Quantity: 1 Right Leg Compression Amount: 20-30 mmHg Discharge Instruction: Apply Circaid Juxta Lite Compression Wrap as directed Add-Ons Electronic Signature(s) Signed: 04/01/2022 3:54:37 PM By: [name] Coria RN Entered By: [name] on 04/01/2022 14:11:17 [name] ([id number]) -------------------------------------------------------------------------------- [place] Details Patient Name: [name] Date of Service: 04/01/2022 2:00 PM Medical Record Number: [id number] Patient Account Number: [id number] Date of Birth/Sex: 1946-02-26 (76 y.o. F) Treating RN: [name] Primary Care Mccall Will: [name] Other Clinician: Referring Napolean Sia: [name] Treating Wentworth Edelen/Extender: [name] in Treatment: 21 Vital Signs Time Taken: 14:00 Temperature (F): 98.7 Height (in): 69 Pulse (bpm): 64 Weight (lbs): 244 Respiratory Rate (breaths/min): 18 Body Mass Index (BMI): 36 Blood Pressure (mmHg): 168/80 Reference Range: 80 - 120 mg / dl Electronic Signature(s) Signed: 04/01/2022 3:54:37 PM By: [name] Coria RN Entered By: [name] on 04/01/2022 14:04:18

## 2022-04-08 ENCOUNTER — Encounter: Payer: Medicare Other | Attending: Physician Assistant | Admitting: Physician Assistant

## 2022-04-08 DIAGNOSIS — I890 Lymphedema, not elsewhere classified: Secondary | ICD-10-CM | POA: Insufficient documentation

## 2022-04-08 DIAGNOSIS — M1990 Unspecified osteoarthritis, unspecified site: Secondary | ICD-10-CM | POA: Diagnosis not present

## 2022-04-08 DIAGNOSIS — L97812 Non-pressure chronic ulcer of other part of right lower leg with fat layer exposed: Secondary | ICD-10-CM | POA: Diagnosis present

## 2022-04-08 DIAGNOSIS — Z9221 Personal history of antineoplastic chemotherapy: Secondary | ICD-10-CM | POA: Diagnosis not present

## 2022-04-08 DIAGNOSIS — I110 Hypertensive heart disease with heart failure: Secondary | ICD-10-CM | POA: Diagnosis not present

## 2022-04-08 DIAGNOSIS — Z923 Personal history of irradiation: Secondary | ICD-10-CM | POA: Diagnosis not present

## 2022-04-08 DIAGNOSIS — I509 Heart failure, unspecified: Secondary | ICD-10-CM | POA: Insufficient documentation

## 2022-04-08 DIAGNOSIS — G629 Polyneuropathy, unspecified: Secondary | ICD-10-CM | POA: Insufficient documentation

## 2022-04-08 DIAGNOSIS — I872 Venous insufficiency (chronic) (peripheral): Secondary | ICD-10-CM | POA: Insufficient documentation

## 2022-04-08 NOTE — Progress Notes (Signed)
[name], [name] ([id number]) Visit Report for 04/08/2022 Arrival Information Details Patient Name: [name], [name] Date of Service: 04/08/2022 2:00 PM Medical Record Number: [id number] Patient Account Number: [id number] Date of Birth/Sex: [birth year] (76 y.o. F) Treating RN: Carlene Coria Primary Care Mohamad Bruso: [name] Other Clinician: Referring Keeshawn Fakhouri: [name] Treating [name]/Extender: Skipper Cliche in Treatment: 68 Visit Information History Since Last Visit All ordered tests and consults were completed: No Patient Arrived: Wheel Chair Added or deleted any medications: No Arrival Time: 14:07 Any new allergies or adverse reactions: No Accompanied By: self Had a fall or experienced change in No Transfer Assistance: None activities of daily living that may affect Patient Identification Verified: Yes risk of falls: Secondary Verification Process Completed: Yes Signs or symptoms of abuse/neglect since last visito No Patient Requires Transmission-Based Precautions: No Hospitalized since last visit: No Patient Has Alerts: Yes Implantable device outside of the clinic excluding No Patient Alerts: NOT DIABETIC cellular tissue based products placed in the center since last visit: Has Dressing in Place as Prescribed: Yes Has Compression in Place as Prescribed: Yes Pain Present Now: No Electronic Signature(s) Signed: 04/08/2022 4:03:12 PM By: Carlene Coria RN Entered By: Carlene Coria on 04/08/2022 14:20:28 [name] ([id number]) -------------------------------------------------------------------------------- Clinic Level of Care Assessment Details Patient Name: [name] Date of Service: 04/08/2022 2:00 PM Medical Record Number: [id number] Patient Account Number: [id number] Date of Birth/Sex: 1946/02/10 (76 y.o. F) Treating RN: Carlene Coria Primary Care Leaf [employer]: [name] Other Clinician: Referring Ahni Bradwell: [name] Treating Ranette Luckadoo/Extender: Skipper Cliche in Treatment: 63 Clinic Level of Care Assessment Items TOOL 1 Quantity Score '[]'$  - Use when EandM and Procedure is performed on INITIAL visit 0 ASSESSMENTS - Nursing Assessment / Reassessment '[]'$  - General Physical Exam (combine w/ comprehensive assessment (listed just below) when performed on new 0 pt. evals) '[]'$  - 0 Comprehensive Assessment (HX, ROS, Risk Assessments, Wounds Hx, etc.) ASSESSMENTS - Wound and Skin Assessment / Reassessment '[]'$  - Dermatologic / Skin Assessment (not related to wound area) 0 ASSESSMENTS - Ostomy and/or Continence Assessment and Care '[]'$  - Incontinence Assessment and Management 0 '[]'$  - 0 Ostomy Care Assessment and Management (repouching, etc.) PROCESS - Coordination of Care '[]'$  - Simple Patient / Family Education for ongoing care 0 '[]'$  - 0 Complex (extensive) Patient / Family Education for ongoing care '[]'$  - 0 Staff obtains Programmer, systems, Records, Test Results / Process Orders '[]'$  - 0 Staff telephones HHA, Nursing Homes / Clarify orders / etc '[]'$  - 0 Routine Transfer to another Facility (non-emergent condition) '[]'$  - 0 Routine Hospital Admission (non-emergent condition) '[]'$  - 0 New Admissions / Biomedical engineer / Ordering NPWT, Apligraf, etc. '[]'$  - 0 Emergency Hospital Admission (emergent condition) PROCESS - Special Needs '[]'$  - Pediatric / Minor Patient Management 0 '[]'$  - 0 Isolation Patient Management '[]'$  - 0 Hearing / Language / Visual special needs '[]'$  - 0 Assessment of Community assistance (transportation, D/C planning, etc.) '[]'$  - 0 Additional assistance / Altered mentation '[]'$  - 0 Support Surface(s) Assessment (bed, cushion, seat, etc.) INTERVENTIONS - Miscellaneous '[]'$  - External ear exam 0 '[]'$  - 0 Patient Transfer (multiple staff / Civil Service fast streamer / Similar devices) '[]'$  - 0 Simple Staple / Suture removal (25 or less) '[]'$  - 0 Complex Staple / Suture removal (26 or more) '[]'$  - 0 Hypo/Hyperglycemic Management (do not check if billed  separately) '[]'$  - 0 Ankle / Brachial Index (ABI) - do not check if billed separately Has the patient been seen at the hospital within the  last three years: Yes Total Score: 0 Level Of Care: ____ [name] ([id number]) Electronic Signature(s) Signed: 04/08/2022 4:03:12 PM By: Carlene Coria RN Entered By: Carlene Coria on 04/08/2022 15:56:27 [name] ([id number]) -------------------------------------------------------------------------------- Compression Therapy Details Patient Name: [name] Date of Service: 04/08/2022 2:00 PM Medical Record Number: [id number] Patient Account Number: [id number] Date of Birth/Sex: 1946-03-11 (76 y.o. F) Treating RN: Carlene Coria Primary Care Yevonne Yokum: [name] Other Clinician: Referring Beatric Fulop: [name] Treating Walfred Bettendorf/Extender: Skipper Cliche in Treatment: 63 Compression Therapy Performed for Wound Assessment: Wound #4 Right,Lateral Lower Leg Performed By: Clinician Carlene Coria, RN Compression Type: Three Layer Post Procedure Diagnosis Same as Pre-procedure Electronic Signature(s) Signed: 04/08/2022 4:03:12 PM By: Carlene Coria RN Entered By: Carlene Coria on 04/08/2022 15:04:10 [name] ([id number]) -------------------------------------------------------------------------------- Compression Therapy Details Patient Name: [name] Date of Service: 04/08/2022 2:00 PM Medical Record Number: [id number] Patient Account Number: [id number] Date of Birth/Sex: 04-01-1946 (76 y.o. F) Treating RN: Carlene Coria Primary Care Jenavee Laguardia: [name] Other Clinician: Referring Gustav Knueppel: [name] Treating Shaheim Mahar/Extender: Skipper Cliche in Treatment: 63 Compression Therapy Performed for Wound Assessment: Wound #5 Left Lower Leg Performed By: Clinician Carlene Coria, RN Compression Type: Three Layer Post Procedure Diagnosis Same as Pre-procedure Electronic Signature(s) Signed: 04/08/2022 4:03:12 PM By: Carlene Coria RN Entered By: Carlene Coria on 04/08/2022 15:04:43 [name] ([id number]) -------------------------------------------------------------------------------- Encounter Discharge Information Details Patient Name: [name] Date of Service: 04/08/2022 2:00 PM Medical Record Number: [id number] Patient Account Number: [id number] Date of Birth/Sex: 19-Apr-1946 (76 y.o. F) Treating RN: Carlene Coria Primary Care Isador Castille: [name] Other Clinician: Referring Kriss Perleberg: [name] Treating Tyishia Aune/Extender: Skipper Cliche in Treatment: 72 Encounter Discharge Information Items Discharge Condition: Stable Ambulatory Status: Wheelchair Discharge Destination: Home Transportation: Private Auto Accompanied By: self Schedule Follow-up Appointment: Yes Clinical Summary of Care: Electronic Signature(s) Signed: 04/08/2022 3:58:06 PM By: Carlene Coria RN Entered By: Carlene Coria on 04/08/2022 15:58:06 [name] ([id number]) -------------------------------------------------------------------------------- Lower Extremity Assessment Details Patient Name: [name] Date of Service: 04/08/2022 2:00 PM Medical Record Number: [id number] Patient Account Number: [id number] Date of Birth/Sex: 31-Mar-1946 (76 y.o. F) Treating RN: Carlene Coria Primary Care Martine Trageser: [name] Other Clinician: Referring Lloyde Ludlam: [name] Treating Sayge Salvato/Extender: Skipper Cliche in Treatment: 63 Edema Assessment Assessed: [Left: No] [Right: No] Edema: [Left: Ye] [Right: s] Calf Left: Right: Point of Measurement: 31 cm From Medial Instep 55 cm Ankle Left: Right: Point of Measurement: 10 cm From Medial Instep 25 cm Vascular Assessment Pulses: Dorsalis Pedis Palpable: [Right:Yes] Electronic Signature(s) Signed: 04/08/2022 4:03:12 PM By: Carlene Coria RN Entered By: Carlene Coria on 04/08/2022 14:34:50 [name]  ([id number]) -------------------------------------------------------------------------------- Multi Wound Chart Details Patient Name: [name] Date of Service: 04/08/2022 2:00 PM Medical Record Number: [id number] Patient Account Number: [id number] Date of Birth/Sex: 1946-02-04 (76 y.o. F) Treating RN: Carlene Coria Primary Care Elloise Roark: [name] Other Clinician: Referring Mariyah Upshaw: [name] Treating Joachim Carton/Extender: Skipper Cliche in Treatment: 63 Vital Signs Height(in): 69 Pulse(bpm): 36 Weight(lbs): 244 Blood Pressure(mmHg): 122/61 Body Mass Index(BMI): 36 Temperature(F): 98.2 Respiratory Rate(breaths/min): 18 Photos: [N/A:N/A] Wound Location: Right, Lateral Lower Leg Left Lower Leg N/A Wounding Event: Gradually Appeared Blister N/A Primary Etiology: Venous Leg Ulcer Venous Leg Ulcer N/A Comorbid History: Cataracts, Lymphedema, Sleep Cataracts, Lymphedema, Sleep N/A Apnea, Congestive Heart Failure, Apnea, Congestive Heart Failure, Hypertension, Osteoarthritis, Hypertension, Osteoarthritis, Neuropathy, Received Neuropathy, Received Chemotherapy, Received Radiation Chemotherapy, Received Radiation Date Acquired: 10/27/2021 04/04/2022 N/A Weeks of Treatment: 23 0 N/A Wound Status: Open Open N/A Wound  Recurrence: No No N/A Measurements L x W x D (cm) 5x6x0.1 3x26x0.1 N/A Area (cm) : 23.562 61.261 N/A Volume (cm) : 2.356 6.126 N/A % Reduction in Area: -1900.20% 0.00% N/A % Reduction in Volume: -1896.60% 0.00% N/A Classification: Full Thickness Without Exposed Full Thickness Without Exposed N/A Support Structures Support Structures Exudate Amount: Medium Medium N/A Exudate Type: Serosanguineous Serosanguineous N/A Exudate Color: red, brown red, brown N/A Granulation Amount: Medium (34-66%) Medium (34-66%) N/A Granulation Quality: Pink Red, Pink N/A Necrotic Amount: Medium (34-66%) Medium (34-66%) N/A Exposed Structures: Fascia: No Fat Layer (Subcutaneous  Tissue): N/A Fat Layer (Subcutaneous Tissue): Yes No Fascia: No Tendon: No Tendon: No Muscle: No Muscle: No Joint: No Joint: No Bone: No Bone: No Epithelialization: None None N/A Treatment Notes Electronic Signature(s) Signed: 04/08/2022 4:03:12 PM By: Carlene Coria RN Entered By: Carlene Coria on 04/08/2022 14:35:06 [name] ([id number]) [name], [name] ([id number]) -------------------------------------------------------------------------------- Multi-Disciplinary Care Plan Details Patient Name: [name] Date of Service: 04/08/2022 2:00 PM Medical Record Number: [id number] Patient Account Number: [id number] Date of Birth/Sex: [birth year] (76 y.o. F) Treating RN: Carlene Coria Primary Care Aluel Schwarz: [name] Other Clinician: Referring Jeanine Caven: [name] Treating Tiana Sivertson/Extender: Skipper Cliche in Treatment: 57 Active Inactive Electronic Signature(s) Signed: 04/08/2022 4:03:12 PM By: Carlene Coria RN Entered By: Carlene Coria on 04/08/2022 14:34:58 [name] ([id number]) -------------------------------------------------------------------------------- Pain Assessment Details Patient Name: [name] Date of Service: 04/08/2022 2:00 PM Medical Record Number: [id number] Patient Account Number: [id number] Date of Birth/Sex: Feb 27, 1946 (76 y.o. F) Treating RN: Carlene Coria Primary Care Doha Boling: [name] Other Clinician: Referring Akita Maxim: [name] Treating Shanetta Nicolls/Extender: Skipper Cliche in Treatment: 63 Active Problems Location of Pain Severity and Description of Pain Patient Has Paino No Site Locations Pain Management and Medication Current Pain Management: Electronic Signature(s) Signed: 04/08/2022 4:03:12 PM By: Carlene Coria RN Entered By: Carlene Coria on 04/08/2022 14:21:43 [name] ([id number]) -------------------------------------------------------------------------------- Patient/Caregiver Education  Details Patient Name: [name] Date of Service: 04/08/2022 2:00 PM Medical Record Number: [id number] Patient Account Number: [id number] Date of Birth/Gender: Feb 19, 1946 (76 y.o. F) Treating RN: Carlene Coria Primary Care Physician: [name] Other Clinician: Referring Physician: [name] Treating Physician/Extender: Skipper Cliche in Treatment: 30 Education Assessment Education Provided To: Patient Education Topics Provided Wound/Skin Impairment: Methods: Explain/Verbal Responses: State content correctly Electronic Signature(s) Signed: 04/08/2022 4:03:12 PM By: Carlene Coria RN Entered By: Carlene Coria on 04/08/2022 15:57:05 [name] ([id number]) -------------------------------------------------------------------------------- Wound Assessment Details Patient Name: [name] Date of Service: 04/08/2022 2:00 PM Medical Record Number: [id number] Patient Account Number: [id number] Date of Birth/Sex: 1946-05-25 (76 y.o. F) Treating RN: Carlene Coria Primary Care Mychele Seyller: [name] Other Clinician: Referring Yusif Gnau: [name] Treating Mabell Esguerra/Extender: Skipper Cliche in Treatment: 63 Wound Status Wound Number: 4 Primary Venous Leg Ulcer Etiology: Wound Location: Right, Lateral Lower Leg Wound Open Wounding Event: Gradually Appeared Status: Date Acquired: 10/27/2021 Comorbid Cataracts, Lymphedema, Sleep Apnea, Congestive Heart Weeks Of Treatment: 23 History: Failure, Hypertension, Osteoarthritis, Neuropathy, Clustered Wound: No Received Chemotherapy, Received Radiation Photos Wound Measurements Length: (cm) 5 Width: (cm) 6 Depth: (cm) 0.1 Area: (cm) 23.562 Volume: (cm) 2.356 % Reduction in Area: -1900.2% % Reduction in Volume: -1896.6% Epithelialization: None Tunneling: No Undermining: No Wound Description Classification: Full Thickness Without Exposed Support Structures Exudate Amount: Medium Exudate Type:  Serosanguineous Exudate Color: red, brown Foul Odor After Cleansing: No Slough/Fibrino Yes Wound Bed Granulation Amount: Medium (34-66%) Exposed Structure Granulation Quality: Pink Fascia Exposed: No Necrotic Amount: Medium (34-66%) Fat Layer (Subcutaneous Tissue) Exposed: No  Necrotic Quality: Adherent Slough Tendon Exposed: No Muscle Exposed: No Joint Exposed: No Bone Exposed: No Treatment Notes Wound #4 (Lower Leg) Wound Laterality: Right, Lateral Cleanser Soap and Water Discharge Instruction: Gently cleanse wound with antibacterial soap, rinse and pat dry prior to dressing wounds Wound Cleanser MERCY, LEPPLA ([id number]) Discharge Instruction: Wash your hands with soap and water. Remove old dressing, discard into plastic bag and place into trash. Cleanse the wound with Wound Cleanser prior to applying a clean dressing using gauze sponges, not tissues or cotton balls. Do not scrub or use excessive force. Pat dry using gauze sponges, not tissue or cotton balls. Peri-Wound Care Topical Primary Dressing Silvercel Small 2x2 (in/in) Discharge Instruction: Apply Silvercel Small 2x2 (in/in) as instructed Secondary Dressing ABD Pad 5x9 (in/in) Discharge Instruction: Cover with ABD pad Secured With Compression Wrap 3-LAYER WRAP - Profore Lite LF 3 Multilayer Compression Bandaging System Discharge Instruction: Apply 3 multi-layer wrap as prescribed. Compression Stockings Circaid Juxta Lite Compression Wrap Quantity: 1 Right Leg Compression Amount: 20-30 mmHg Discharge Instruction: Apply Circaid Juxta Lite Compression Wrap as directed Add-Ons Electronic Signature(s) Signed: 04/08/2022 4:03:12 PM By: Carlene Coria RN Entered By: Carlene Coria on 04/08/2022 14:33:24 [name] ([id number]) -------------------------------------------------------------------------------- Wound Assessment Details Patient Name: [name] Date of Service: 04/08/2022 2:00 PM Medical  Record Number: [id number] Patient Account Number: [id number] Date of Birth/Sex: [birth year] (76 y.o. F) Treating RN: Carlene Coria Primary Care Aleisha Paone: [name] Other Clinician: Referring Karelly Dewalt: [name] Treating Kyrstan Gotwalt/Extender: Skipper Cliche in Treatment: 63 Wound Status Wound Number: 5 Primary Venous Leg Ulcer Etiology: Wound Location: Left Lower Leg Wound Open Wounding Event: Blister Status: Date Acquired: 04/04/2022 Comorbid Cataracts, Lymphedema, Sleep Apnea, Congestive Heart Weeks Of Treatment: 0 History: Failure, Hypertension, Osteoarthritis, Neuropathy, Clustered Wound: No Received Chemotherapy, Received Radiation Photos Wound Measurements Length: (cm) 3 Width: (cm) 26 Depth: (cm) 0.1 Area: (cm) 61.261 Volume: (cm) 6.126 % Reduction in Area: 0% % Reduction in Volume: 0% Epithelialization: None Tunneling: No Undermining: No Wound Description Classification: Full Thickness Without Exposed Support Structu Exudate Amount: Medium Exudate Type: Serosanguineous Exudate Color: red, brown res Foul Odor After Cleansing: No Slough/Fibrino Yes Wound Bed Granulation Amount: Medium (34-66%) Exposed Structure Granulation Quality: Red, Pink Fascia Exposed: No Necrotic Amount: Medium (34-66%) Fat Layer (Subcutaneous Tissue) Exposed: Yes Tendon Exposed: No Muscle Exposed: No Joint Exposed: No Bone Exposed: No Treatment Notes Wound #5 (Lower Leg) Wound Laterality: Left Cleanser Soap and Water Discharge Instruction: Gently cleanse wound with antibacterial soap, rinse and pat dry prior to dressing wounds Wound Cleanser TWYLAH, BENNETTS ([id number]) Discharge Instruction: Wash your hands with soap and water. Remove old dressing, discard into plastic bag and place into trash. Cleanse the wound with Wound Cleanser prior to applying a clean dressing using gauze sponges, not tissues or cotton balls. Do not scrub or use excessive force. Pat dry using gauze  sponges, not tissue or cotton balls. Peri-Wound Care Topical Primary Dressing Xeroform 5x9-HBD (in/in) Discharge Instruction: Apply Xeroform 5x9-HBD (in/in) as directed Secondary Dressing ABD Pad 5x9 (in/in) Discharge Instruction: Cover with ABD pad Secured With Compression Wrap 3-LAYER WRAP - Profore Lite LF 3 Multilayer Compression Bandaging System Discharge Instruction: Apply 3 multi-layer wrap as prescribed. Compression Stockings Add-Ons Electronic Signature(s) Signed: 04/08/2022 4:03:12 PM By: Carlene Coria RN Entered By: Carlene Coria on 04/08/2022 14:34:20 [name] ([id number]) -------------------------------------------------------------------------------- Vitals Details Patient Name: [name] Date of Service: 04/08/2022 2:00 PM Medical Record Number: [id number] Patient Account Number: [id number] Date of Birth/Sex: 20-Nov-1945 (76 y.o. F) Treating  RN: Carlene Coria Primary Care Airon Sahni: [name] Other Clinician: Referring Benjerman Molinelli: [name] Treating Xai Frerking/Extender: Skipper Cliche in Treatment: 63 Vital Signs Time Taken: 14:15 Temperature (F): 98.2 Height (in): 69 Pulse (bpm): 66 Weight (lbs): 244 Respiratory Rate (breaths/min): 18 Body Mass Index (BMI): 36 Blood Pressure (mmHg): 122/61 Reference Range: 80 - 120 mg / dl Electronic Signature(s) Signed: 04/08/2022 4:03:12 PM By: Carlene Coria RN Entered By: Carlene Coria on 04/08/2022 14:21:25

## 2022-04-08 NOTE — Progress Notes (Addendum)
TAKIMA, ENCINA ([id number]) Visit Report for 04/08/2022 Chief Complaint Document Details Patient Name: [name], [name] Date of Service: 04/08/2022 2:00 PM Medical Record Number: [id number] Patient Account Number: [id number] Date of Birth/Sex: 05/01/1946 (76 y.o. F) Treating RN: Carlene Coria Primary Care Provider: [name] Other Clinician: Referring Provider: [name] Treating Provider/Extender: Skipper Cliche in Treatment: 45 Information Obtained from: Patient Chief Complaint Bilateral LE Ulcers Electronic Signature(s) Signed: 04/08/2022 2:25:54 PM By: Worthy Keeler PA-C Entered By: Worthy Keeler on 04/08/2022 14:25:54 [name] ([id number]) -------------------------------------------------------------------------------- HPI Details Patient Name: [name] Date of Service: 04/08/2022 2:00 PM Medical Record Number: [id number] Patient Account Number: [id number] Date of Birth/Sex: 12/07/1945 (76 y.o. F) Treating RN: Carlene Coria Primary Care Provider: [name] Other Clinician: Referring Provider: [name] Treating Provider/Extender: Skipper Cliche in Treatment: 33 History of Present Illness HPI Description: 03/15/2020 upon evaluation today patient presents today for initial evaluation here in our clinic concerning a wound that is on the left posterior lower extremity. Unfortunately this has been giving the patient some discomfort at this point which she notes has been affected her pretty much daily. The patient does have a history of venous insufficiency, hypertension, congestive heart failure, and a history of breast cancer. Fortunately there does not appear to be evidence of active infection at this time which is great news. No fevers, chills, nausea, vomiting, or diarrhea. Wound is not extremely large but does have some slough covering the surface of the wound. This is going require sharp debridement today. 03/15/2020 upon evaluation today patient  appears to be doing fairly well in regard to her wound. She does have some slough noted on the surface of the wound currently but I do believe the Iodoflex has been beneficial over the past week. There is no signs of active infection at this time which is great news and overall very pleased with the progress. No fevers, chills, nausea, vomiting, or diarrhea. 04/02/2020 upon evaluation today patient appears to be doing a little better in regard to her wound size wise this is not tremendously smaller she does have some slough buildup on the surface of the wound. With that being said this is going require some sharp debridement today to clear away some of the necrotic debris. Fortunately there is no evidence of active infection at this time. No fevers, chills, nausea, vomiting, or diarrhea. 04/10/2020 on evaluation today patient appears to be doing well with regard to her ulcer which is measuring somewhat smaller today. She actually has more granulation tissue noted which is also good news is no need for sharp debridement today. Fortunately there is no evidence of infection either which is also excellent. No fevers, chills, nausea, vomiting, or diarrhea. 04/26/2020 on evaluation today patient's wound actually is showing signs of good improvement which is great news. There does not appear to be any evidence of active infection. I do believe she is tolerating the collagen at this point. 05/03/2020 upon evaluation today patient's wound actually showed signs of good granulation at this time there does not appear to be any evidence of active infection which is great news and overall very pleased with where things stand. With that being said I do believe that the patient is can require some sharp debridement today but fortunately nothing too significant. 05/11/20 on evaluation today patient appears to be doing well in regard to her leg ulcer. She's been tolerating the dressing changes without complication.  Fortunately there is no signs of active infection at this time.  Overall I'm very pleased with where things stand. 05/18/2020 upon evaluation today patient's wound is showing signs of improvement is very dry and I think the collagen for the most part just dried to the wound bed. I am going to clean this away with sharp debridement in order to get this under control in my opinion. 05/25/2020 upon evaluation today patient actually appears to be doing well in regard to her leg ulcer. In fact upon inspection it appears she could potentially be completely healed but that is not guaranteed based on what we are seeing. There does not appear to be any signs of active infection which is great news. 05/31/2020 on evaluation today patient appears to be doing well with regard to her wounds. She in fact appears to be almost completely healed on the left leg there is just a very small opening remaining point 1 06/08/2020 upon evaluation today patient appears to be doing excellent in regard to her leg ulcer on the left in fact it appears to be that she is completely healed as of today. I see no signs of active infection at this time which is great news. No fevers, chills, nausea, vomiting, or diarrhea. READMISSION 01/16/2021 Patient that we have had in this clinic with a wound on the left posterior calf in the summer 2021 extending into October. She was felt to have chronic venous insufficiency. Per the patient's description she was discharged with what sounds like external compression stockings although she could not afford the "$75 per leg". She therefore did not get anything. Apparently her wound that she has currently is been in existence since January. She has been followed at vein and vascular with Unna boots and I think calcium alginate. She had ABIs done on 06/25/2020 that showed an noncompressible ABI on the right leg and the left leg but triphasic waveforms with good great toe pressures and a TBI of 0.90 on the  right and 0.91 on the left Surprisingly the patient also had venous reflux studies that were really unremarkable. This included no evidence of DVTs or SVTs but there was no evidence of deep venous insufficiency or superficial venous insufficiency in the greater saphenous or short saphenous veins bilaterally. I would wonder about more central venous issues or perhaps this is all lymphedema 01/25/2021 upon evaluation today patient appears to be doing decently well and guard to her wound. The good news is the Iodoflex that is job she saw Dr. Dellia Nims last week for readmission and to be honest I think that this has done extremely well over that week. I do believe that she can tolerate the sharp debridement today which will be good. I think that cleaning off the wound will likely allow [place] to be able to use a different type of dressing I do not think the Iodoflex will even be necessary based on how clean the wound looks. Fortunately there is no sign of active infection at this time which is great news. No fevers, chills, nausea, vomiting, or diarrhea. 02/12/2021 upon evaluation today patient appears to be doing well with regard to her leg ulcer. We did switch to alginate when she came for nurse visit I think is doing much better for her. Fortunately there does not appear to be any signs of active infection at this time which is great news. No fevers, chills, nausea, vomiting, or diarrhea. [name], [name] ([id number]) 02/18/2021 upon evaluation today patient's wound is actually showing signs of good improvement. I am very pleased with where things stand  currently. I do not see any signs of active infection which is great news and overall I feel like she is making good progress. The patient likewise is happy that things are doing so well and that this is measuring somewhat smaller. 02/25/2021 upon evaluation today patient actually appears to be doing quite well in regard to her wounds. She has been tolerating the  dressing changes without complication. Fortunately there does not appear to be any signs of active infection which is great news and overall I am extremely pleased with where things stand. No fevers, chills, nausea, vomiting, or diarrhea. She does have a lot of edema I really do think she needs compression socks to be worn in order to prevent things from causing additional issues for her currently. Especially on the right leg she should be wearing compression socks all the time to be honest. 03/04/2021 upon evaluation today patient's leg ulcer actually appears to be doing pretty well which is great news. There does not appear to be any significant change but overall the appearance is better even though the size is not necessarily reflecting a great improvement. Fortunately there does not appear to be any signs of active infection. No fevers, chills, nausea, vomiting, or diarrhea. 03/12/2021 upon evaluation today patient appears to be doing well with regard to her wounds. She has been tolerating the dressing changes without complication. The good news is that the wound on her left lateral leg is doing much better and is showing signs of good improvement. Overall her swelling is controlled well as well. She does need some compression socks she plans to go Jefferey socks next week. 03/18/2021 on evaluation today patient's wound actually is showing signs of good improvement. She does have a little bit of slough this can require some sharp debridement today. 03/25/2021 upon evaluation today patient appears to be doing well with regard to her wound on the left lateral leg. She has been tolerating the dressing changes without complication. Fortunately there does not appear to be any signs of active infection at this time. No fever chills no 04/01/2021 upon evaluation today patient's wound is showing signs of improvement and overall very pleased with where things stand at this point. There is no evidence of active  infection which is great news as well and in general I think that she is making great progress. 04/09/2021 upon evaluation today patient appears to be doing well with regard to her ankle ulcer. She is making good progress currently which is great news. There does not appear to be any signs of infection also excellent news. In general I am extremely pleased with where things stand today. No fevers, chills, nausea, vomiting, or diarrhea. 04/23/2021 upon inspection today patient appears to be doing quite well with regard to her leg ulcer. She has been tolerating the dressing changes without complication. She is can require some sharp debridement today but overall seems to be doing excellent. 05/06/2021 upon evaluation today patient unfortunately appears to be doing poorly overall in regard to her swelling. She is actually significantly more swollen than last week despite the fact that her wound is doing better this is not a good trend. I think that she probably needs to see her cardiologist ASAP and I discussed that with her today. This includes the fact that honestly I think she probably is getting need an increase in her diuretic or something to try to clear away some of the excess fluid that she is experiencing at the moment. She is  not been doing her pumps even twice a day but again right now more concerned that if she were to do the pump she would fluid overload her lungs causing other issues as far as her breathing is concerned. Obviously I think she needs to contact them and move up the appointment for the 12th of something sooner 2 weeks is a bit too long to wait with how swollen she has. 05/14/2021 upon evaluation today patient's wound actually showing signs of being a little bit smaller compared to previous. She has been making good progress however. Fortunately there does not appear to be any evidence of active infection which is great. Overall I am extremely pleased in that regard. Nonetheless I am  still concerned that she is having quite a bit of issue as far as the swelling is concerned she has been placed on fluid restriction as well as an increase in the furosemide by her doctor. We will see how things progress. 05/21/2021 upon evaluation today patient appears to be doing well with regard to her wound in general. She has been tolerating the dressing changes without complication. With that being said she has been using silver cell for a while and while this was showing signs of improvement it is now gotten to the point where this has slowed down quite significantly. I do believe that switching to a different dressing may be beneficial for her today. 05/28/2021 upon evaluation today patient actually appears to be doing quite well in regard to her wound. In fact there is really not any need for sharp debridement today based on what I see. The Hydrofera Blue is doing great and overall I think that she is managing quite nicely with the compression wrapping. Her edema is still down quite a bit with the wrapping in place. 06/11/2021 upon evaluation today patient appears to be doing well with regard to her leg ulcer. She has been tolerating the dressing changes without complication. Fortunately there does not appear to be any signs of active infection at this time which is great news. No fevers, chills, nausea, vomiting, or diarrhea. She is going require some sharp debridement today. 10/11; left leg ulcer much better looking and with improved surface area. She is using Hydrofera Blue under 3 layer compression. She does not have any stocking on the right leg which in itself has very significant nonpitting edema 06/25/2021 upon evaluation today patient appears to be doing well with regard to the wound on her left leg. She has been tolerating the dressing changes without complication and overall I am extremely pleased with where things stand today. I do not see any evidence of infection which is great  news. 07/02/2021 upon evaluation today patient's wound actually showing signs of excellent improvement and actually very pleased with where we stand today and the overall appearance. Fortunately there does not appear to be any signs of active infection. No fevers, chills, nausea, vomiting, or diarrhea. 07/09/2021 upon evaluation today patient appears to be doing better in regard to her wound this is measuring smaller and she is headed in the appropriate direction based on what I am seeing currently. I do not see any evidence of active infection systemically which is great news. 07/16/2021 upon evaluation today patient appears to be doing pretty well currently in regard to her leg ulcer. This is measuring smaller and looking much better this is great news. Fortunately there does not appear to be any evidence of active infection systemically which is great news as well.  07/23/2021 upon evaluation today patient's wound is actually showing signs of good improvement this is definitely measuring smaller. I do not see any signs of infection which is great news and overall very pleased in that regard. Overall I think that she is doing well with the Nerida Boivin, [place] ([id number]) [place]. 07/30/2020 upon evaluation today patient appears to be doing well in regard to her wound. She has been tolerating the dressing changes without complication think the Hydrofera Blue at this point is actually causing her to dry out a lot as far as the wound bed is concerned. Subsequently I think that we need to try to see what we can do to improve this. Fortunately there does not appear to be any evidence of active infection at this time which is great news. 08/06/2021 upon evaluation today patient appears to be doing well with regards to her wound. Is not measuring significantly smaller initial inspection but upon closer inspection she has a lot of new skin growth across the central portion of the wound. I think will  get very close to complete resolution. 08/13/2021 upon evaluation today patient appears to be doing well with regard to her wound. In fact this appears to be I believe healed although there is still some question as to whether it is completely so. I am concerned about the fact that to be honest she still has some evidence of dry skin that could be just trapping something underneath I still want to debride anything as I am afraid of causing some damage to the good skin for that reason I Georgina Peer probably monitor for 1 more week before completely closing everything out. 08/20/2021 upon evaluation today patient actually appears to be doing excellent. In fact she appears to be completely healed. This was the case last week as well we just wanted to make sure nothing reopened since she does not really have an ability to put on compression socks this is good to be something we need to make sure was well-healed. Nonetheless I think we have achieved that goal as of today. 12/27; this is a patient we discharged 2 weeks ago with wounds on her left lower leg laterally. She was supposed to get stockings and really did not get them. She developed blistering and reopening probably sometime late last week. She has 3 areas in the same area as previously. The mid calf dimensions on the left went from 39 to 51 cm today. She also has very significant edema on the right leg but as far as I am aware has not had wounds in this area 1/4; patient presents for follow-up. She has tolerated the compression wrap well. She has no issues or complaints today. 09/17/2021 upon evaluation today patient appears to be doing well with regard to her wounds. She has been tolerating the dressing changes without complication. Fortunately there does not appear to be any signs of active infection at this time. No fevers, chills, nausea, vomiting, or diarrhea. 10/01/2021 upon evaluation today patient appears to be doing well with regard to her  wound this is actually measuring better and looking smaller and I am very pleased in that regard. Fortunately I do not see any evidence of active infection locally nor systemically at this point. No fevers, chills, nausea, vomiting, or diarrhea. 10/08/2021 upon evaluation patient's wounds are actually showing some signs of improvement measuring a little bit smaller and looking a little bit better. Overall I do not think there is any need for sharp debridement today which  is good news. No fevers, chills, nausea, vomiting, or diarrhea. 10/14/2021 upon evaluation today patient appears to be doing well with regard to her wound. She has been tolerating the dressing changes without complication and overall I am extremely pleased with where things stand today. There is a little bit of film on the surface of the wounds but this was carefully cleaned away with just saline and gauze she really did not want me to do any sharp debridement today. 10/29/2021 upon evaluation today patient appears to be doing quite well in regard to her wounds. She is actually showing signs of excellent improvement which is great news and overall I feel like we are on the right track at this time. She does have a wound on both legs and she does need something compression wise when she heals for that reason we will get a go and see about ordering her bilateral juxta lite compression wraps. 11/05/2021 upon evaluation today patient appears to be doing well with regard to her wound. She has been tolerating the dressing changes without complication. Fortunately I do not see any evidence of active infection locally nor systemically at this time which is great news. No fevers, chills, nausea, vomiting, or diarrhea. 11/12/2021 upon evaluation today patient appears to be doing well with regard to her wounds. She is doing excellent on the left on the right this is not doing quite as well there is some slough and biofilm buildup. 3/14; patient  presents for follow-up. She has no issues or complaints today. She has tolerated the compression wraps well. 11/26/2021 upon evaluation today patient appears to be doing well with regard to her wounds. Both are showing signs of good improvement which is great news. I do not see any evidence of active infection and overall think she is healing quite nicely. 12/03/2021 upon evaluation today patient appears to be doing well with regard to her wounds. Both are showing signs of excellent improvement I am very pleased with where things stand and I think that she is making good progress here. Fortunately I do not see any signs of active infection locally or systemically which is great news. No fevers, chills, nausea, vomiting, or diarrhea. 12-10-2021 upon evaluation today patient's wounds actually appear to be doing awesome. I am extremely pleased with where we stand I think she is making excellent progress. I do not see any signs of active infection locally or systemically at this time. 12-17-2021 upon evaluation today patient's wounds are actually showing signs of good improvement. Fortunately I do not see any evidence of active infection locally nor systemically which is great news. Overall I do not believe that she is showing any signs of significant worsening which is good news. With that being said I do believe that she has a little bit of film on the surface of the wound I was actually able to clear this away with saline and gauze no sharp debridement was necessary. 12-24-2021 upon evaluation today patient appears to be doing well with regard to her wounds. I do not see any signs of active infection currently which is great news and overall I think that we are on the right track here. No fevers, chills, nausea, vomiting, or diarrhea. 12-31-2021 upon evaluation today patient appears to be doing well with regard to her wounds. Both are showing signs of improvement the wound on the right leg is going require  some sharp debridement on the left seems to be doing quite well. 01-07-2022 upon evaluation today patient  appears to be doing well currently in regard to her wounds. She is actually making good progress bilaterally. Both of them require little bit of sharp debridement but not too much which is great news. Nerstrand, Klaudia ([id number]) Upon inspection patient's wound bed actually showed signs of good granulation and epithelization on both legs. She still is having significant issues with the right leg compared to the left although I do feel like both are actually showing signs of pretty good improvement. 01-21-2022 upon evaluation today patient's wound is actually showing signs of excellent improvement I am very pleased with where we stand currently. I do not see any signs of active infection locally or systemically which is great news. No fevers, chills, nausea, vomiting, or diarrhea. 01-28-2022 upon evaluation today patient appears to be doing well with regard to her wound on the left leg this is pretty much just about healed which is great news. Unfortunately in regard to the wound on her right leg this is not doing nearly as well in fact I think we probably need to see about a culture today that was discussed with her. 02-04-2022 upon evaluation today patient appears to be doing well with regard to her left leg which in fact might actually be completely healed I am not 100% sure. Nonetheless this is shown signs of excellent improvement which is great news. With that being said in regards to the right leg there is some slough and film buildup on the surface of the wound although she is feeling much better than last week this does appear to be doing much better than it was last week. Fortunately there does not appear to be any signs of active infection locally or systemically at this time. 02-11-2022 upon evaluation today patient's wounds appear to be doing decently well. In fact the left leg is healed  although there is brand-new skin were definitely given still need to wrap her for the time being. On the right leg this is showing signs of some epithelial growth in the middle of the wound it still measures the same because there is still a larger area with speckled openings but again as far as the entire area being open this is significantly improved which is great news. I am very pleased. 6/13; left leg remains healed and she has a juxta light to apply to it today. There is some concern about whether she is going to be able to do this at home. She lives with a daughter although she has her own disability. On the right the wound looks smaller to me nice epithelialization good edema control. 02-25-2022 upon evaluation today patient appears to be doing well with regard to her legs the left leg is still healed the right leg is doing much better. I think we are on the right track here. With that being said unfortunately she continues to have significant issues with some other problems and she actually tells me on Saturday she was having lunch and went inside to sit down when she tells me she had all of a sudden a sharp sensation that went from her hand through her arm into her leg and her neck and she tells me that she could not see anything for the next hour it was completely black. Slowly after this her vision started to return and has been normal since. It sounds to me as if she may have had a TIA or mini stroke at this point. Nonetheless I think that she needs to have this  checked out ASAP. Her primary care provider is Dr. Clide Deutscher at [facility]. We will get a try to get in touch with him. 03-10-2022 upon evaluation today patient's wound actually showed signs of good improvement. She seems to be making excellent progress here. I do not see any evidence of active infection locally or systemically which is great news. No fevers, chills, nausea, vomiting, or diarrhea. She was in the hospital since have  seen her last she ended up finding out that she was having syncopal episodes. That is what was going on with the blacking out and losing vision. Subsequently they adjusted some of her medication she seems to be doing better. 03-18-2022 upon evaluation today patient's wound is actually showing signs of good granulation and epithelization at this point. Fortunately I do not see any signs of active infection locally or systemically which is great news. 03-25-2022 any evidence of active infection locally or systemically which is great news. Any evidence of active infection which is great news and overall the wound is looking better. Upon evaluation today patient appears to be doing well with regard to her leg ulcer. She has been tolerating the dressing changes without complication. Fortunately I do not see 7/25; the patient's wound is on the right lateral lower extremity using silver alginate 3 layer compression. She has chronic venous insufficiency and lymphedema. We are using a juxta lite on the left leg which does not have any open wounds but she is leaving this on all week it seems that there just is not a way to get this changed at home 04-08-2022 upon evaluation today patient appears to be doing well currently in regard to her wound. She has been tolerating the dressing changes without complication. Fortunately I do not see any evidence of active infection locally or systemically which is great news. No fever or chills noted her left leg has reopened at this point. Electronic Signature(s) Signed: 04/10/2022 6:20:29 PM By: Worthy Keeler PA-C Entered By: Worthy Keeler on 04/10/2022 18:20:29 [name], [name] ([id number]) -------------------------------------------------------------------------------- Physical Exam Details Patient Name: [name] Date of Service: 04/08/2022 2:00 PM Medical Record Number: [id number] Patient Account Number: [id number] Date of Birth/Sex: [birth year] (76 y.o.  F) Treating RN: Carlene Coria Primary Care Provider: [name] Other Clinician: Referring Provider: [name] Treating Provider/Extender: Skipper Cliche in Treatment: 67 Constitutional Well-nourished and well-hydrated in no acute distress. Respiratory normal breathing without difficulty. Psychiatric this patient is able to make decisions and demonstrates good insight into disease process. Alert and Oriented x 3. pleasant and cooperative. Notes Upon inspection patient's wound bed actually showed signs of good granulation and epithelization at this point. Fortunately I do not see any evidence of infection locally or systemically which is great news and overall I am extremely pleased with where things stand currently. Electronic Signature(s) Signed: 04/10/2022 6:20:52 PM By: Worthy Keeler PA-C Entered By: Worthy Keeler on 04/10/2022 18:20:52 [name] ([id number]) -------------------------------------------------------------------------------- Physician Orders Details Patient Name: [name] Date of Service: 04/08/2022 2:00 PM Medical Record Number: [id number] Patient Account Number: [id number] Date of Birth/Sex: 10-17-1945 (76 y.o. F) Treating RN: Carlene Coria Primary Care Provider: [name] Other Clinician: Referring Provider: [name] Treating Provider/Extender: Skipper Cliche in Treatment: 61 Verbal / Phone Orders: No Diagnosis Coding ICD-10 Coding Code Description I89.0 Lymphedema, not elsewhere classified I87.332 Chronic venous hypertension (idiopathic) with ulcer and inflammation of left lower extremity L97.812 Non-pressure chronic ulcer of other part of right lower leg with fat layer exposed  Follow-up Appointments o Return Appointment in 1 week. o Nurse Visit as needed Bathing/ Shower/ Hygiene o May shower with wound dressing protected with water repellent cover or cast protector. o No tub bath. Anesthetic (Use 'Patient  Medications' Section for Anesthetic Order Entry) o Lidocaine applied to wound bed Edema Control - Lymphedema / Segmental Compressive Device / Other Bilateral Lower Extremities o Optional: One layer of unna paste to top of compression wrap (to act as an anchor). - she needs this o Patient to wear own Velcro compression garment. Remove compression stockings every night before going to bed and put on every morning when getting up. - left leg o Elevate, Exercise Daily and Avoid Standing for Long Periods of Time. o Elevate legs to the level of the heart and pump ankles as often as possible o Elevate leg(s) parallel to the floor when sitting. o Compression Pump: Use compression pump on left lower extremity for 60 minutes, twice daily. - You may pump over wraps o Compression Pump: Use compression pump on right lower extremity for 60 minutes, twice daily. - You may pump over wraps Additional Orders / Instructions o Follow Nutritious Diet and Increase Protein Intake Wound Treatment Wound #4 - Lower Leg Wound Laterality: Right, Lateral Cleanser: Soap and Water 1 x Per Week/30 Days Discharge Instructions: Gently cleanse wound with antibacterial soap, rinse and pat dry prior to dressing wounds Cleanser: Wound Cleanser 1 x Per Week/30 Days Discharge Instructions: Wash your hands with soap and water. Remove old dressing, discard into plastic bag and place into trash. Cleanse the wound with Wound Cleanser prior to applying a clean dressing using gauze sponges, not tissues or cotton balls. Do not scrub or use excessive force. Pat dry using gauze sponges, not tissue or cotton balls. Primary Dressing: Silvercel Small 2x2 (in/in) 1 x Per Week/30 Days Discharge Instructions: Apply Silvercel Small 2x2 (in/in) as instructed Secondary Dressing: ABD Pad 5x9 (in/in) 1 x Per Week/30 Days Discharge Instructions: Cover with ABD pad Compression Wrap: 3-LAYER WRAP - Profore Lite LF 3 Multilayer  Compression Bandaging System 1 x Per Week/30 Days Discharge Instructions: Apply 3 multi-layer wrap as prescribed. Compression Stockings: Circaid Juxta Lite Compression Wrap Right Leg Compression Amount: 20-30 mmHG Discharge Instructions: Apply Circaid Juxta Lite Compression Wrap as directed Wound #5 - Lower Leg Wound Laterality: Left [name], [name] ([id number]) Cleanser: Soap and Water 1 x Per Week/30 Days Discharge Instructions: Gently cleanse wound with antibacterial soap, rinse and pat dry prior to dressing wounds Cleanser: Wound Cleanser 1 x Per Week/30 Days Discharge Instructions: Wash your hands with soap and water. Remove old dressing, discard into plastic bag and place into trash. Cleanse the wound with Wound Cleanser prior to applying a clean dressing using gauze sponges, not tissues or cotton balls. Do not scrub or use excessive force. Pat dry using gauze sponges, not tissue or cotton balls. Primary Dressing: Xeroform 5x9-HBD (in/in) 1 x Per Week/30 Days Discharge Instructions: Apply Xeroform 5x9-HBD (in/in) as directed Secondary Dressing: ABD Pad 5x9 (in/in) 1 x Per Week/30 Days Discharge Instructions: Cover with ABD pad Compression Wrap: 3-LAYER WRAP - Profore Lite LF 3 Multilayer Compression Bandaging System 1 x Per Week/30 Days Discharge Instructions: Apply 3 multi-layer wrap as prescribed. Electronic Signature(s) Signed: 04/08/2022 4:03:12 PM By: Carlene Coria RN Signed: 04/10/2022 6:41:29 PM By: Worthy Keeler PA-C Entered By: Carlene Coria on 04/08/2022 15:03:53 [name] ([id number]) -------------------------------------------------------------------------------- Problem List Details Patient Name: [name] Date of Service: 04/08/2022 2:00 PM Medical Record Number: [id number]  Patient Account Number: [id number] Date of Birth/Sex: 14-Jan-1946 (76 y.o. F) Treating RN: Carlene Coria Primary Care Provider: [name] Other Clinician: Referring Provider: [name] Treating Provider/Extender: Skipper Cliche in Treatment: 63 Active Problems ICD-10 Encounter Code Description Active Date MDM Diagnosis I89.0 Lymphedema, not elsewhere classified 01/16/2021 No Yes I87.332 Chronic venous hypertension (idiopathic) with ulcer and inflammation of 01/16/2021 No Yes left lower extremity L97.812 Non-pressure chronic ulcer of other part of right lower leg with fat layer 01/07/2022 No Yes exposed Inactive Problems ICD-10 Code Description Active Date Inactive Date L97.822 Non-pressure chronic ulcer of other part of left lower leg with fat layer exposed 01/16/2021 01/16/2021 Resolved Problems Electronic Signature(s) Signed: 04/08/2022 2:25:43 PM By: Worthy Keeler PA-C Entered By: Worthy Keeler on 04/08/2022 14:25:43 [name] ([id number]) -------------------------------------------------------------------------------- Progress Note Details Patient Name: [name] Date of Service: 04/08/2022 2:00 PM Medical Record Number: [id number] Patient Account Number: [id number] Date of Birth/Sex: [birth year] (76 y.o. F) Treating RN: Carlene Coria Primary Care Provider: [name] Other Clinician: Referring Provider: [name] Treating Provider/Extender: Skipper Cliche in Treatment: 63 Subjective Chief Complaint Information obtained from Patient Bilateral LE Ulcers History of Present Illness (HPI) 03/15/2020 upon evaluation today patient presents today for initial evaluation here in our clinic concerning a wound that is on the left posterior lower extremity. Unfortunately this has been giving the patient some discomfort at this point which she notes has been affected her pretty much daily. The patient does have a history of venous insufficiency, hypertension, congestive heart failure, and a history of breast cancer. Fortunately there does not appear to be evidence of active infection at this time which is great news. No fevers, chills, nausea,  vomiting, or diarrhea. Wound is not extremely large but does have some slough covering the surface of the wound. This is going require sharp debridement today. 03/15/2020 upon evaluation today patient appears to be doing fairly well in regard to her wound. She does have some slough noted on the surface of the wound currently but I do believe the Iodoflex has been beneficial over the past week. There is no signs of active infection at this time which is great news and overall very pleased with the progress. No fevers, chills, nausea, vomiting, or diarrhea. 04/02/2020 upon evaluation today patient appears to be doing a little better in regard to her wound size wise this is not tremendously smaller she does have some slough buildup on the surface of the wound. With that being said this is going require some sharp debridement today to clear away some of the necrotic debris. Fortunately there is no evidence of active infection at this time. No fevers, chills, nausea, vomiting, or diarrhea. 04/10/2020 on evaluation today patient appears to be doing well with regard to her ulcer which is measuring somewhat smaller today. She actually has more granulation tissue noted which is also good news is no need for sharp debridement today. Fortunately there is no evidence of infection either which is also excellent. No fevers, chills, nausea, vomiting, or diarrhea. 04/26/2020 on evaluation today patient's wound actually is showing signs of good improvement which is great news. There does not appear to be any evidence of active infection. I do believe she is tolerating the collagen at this point. 05/03/2020 upon evaluation today patient's wound actually showed signs of good granulation at this time there does not appear to be any evidence of active infection which is great news and overall very pleased with where things stand. With  that being said I do believe that the patient is can require some sharp debridement today but  fortunately nothing too significant. 05/11/20 on evaluation today patient appears to be doing well in regard to her leg ulcer. She's been tolerating the dressing changes without complication. Fortunately there is no signs of active infection at this time. Overall I'm very pleased with where things stand. 05/18/2020 upon evaluation today patient's wound is showing signs of improvement is very dry and I think the collagen for the most part just dried to the wound bed. I am going to clean this away with sharp debridement in order to get this under control in my opinion. 05/25/2020 upon evaluation today patient actually appears to be doing well in regard to her leg ulcer. In fact upon inspection it appears she could potentially be completely healed but that is not guaranteed based on what we are seeing. There does not appear to be any signs of active infection which is great news. 05/31/2020 on evaluation today patient appears to be doing well with regard to her wounds. She in fact appears to be almost completely healed on the left leg there is just a very small opening remaining point 1 06/08/2020 upon evaluation today patient appears to be doing excellent in regard to her leg ulcer on the left in fact it appears to be that she is completely healed as of today. I see no signs of active infection at this time which is great news. No fevers, chills, nausea, vomiting, or diarrhea. READMISSION 01/16/2021 Patient that we have had in this clinic with a wound on the left posterior calf in the summer 2021 extending into October. She was felt to have chronic venous insufficiency. Per the patient's description she was discharged with what sounds like external compression stockings although she could not afford the "$75 per leg". She therefore did not get anything. Apparently her wound that she has currently is been in existence since January. She has been followed at vein and vascular with Unna boots and I think  calcium alginate. She had ABIs done on 06/25/2020 that showed an noncompressible ABI on the right leg and the left leg but triphasic waveforms with good great toe pressures and a TBI of 0.90 on the right and 0.91 on the left Surprisingly the patient also had venous reflux studies that were really unremarkable. This included no evidence of DVTs or SVTs but there was no evidence of deep venous insufficiency or superficial venous insufficiency in the greater saphenous or short saphenous veins bilaterally. I would wonder about more central venous issues or perhaps this is all lymphedema 01/25/2021 upon evaluation today patient appears to be doing decently well and guard to her wound. The good news is the Iodoflex that is job she saw Dr. Dellia Nims last week for readmission and to be honest I think that this has done extremely well over that week. I do believe that she can tolerate the sharp debridement today which will be good. I think that cleaning off the wound will likely allow [place] to be able to use a different type of dressing I do not think the Iodoflex will even be necessary based on how clean the wound looks. Fortunately there is no sign of active infection at this time which is great news. No fevers, chills, nausea, vomiting, or diarrhea. [name], [name] ([id number]) 02/12/2021 upon evaluation today patient appears to be doing well with regard to her leg ulcer. We did switch to alginate when she came for  nurse visit I think is doing much better for her. Fortunately there does not appear to be any signs of active infection at this time which is great news. No fevers, chills, nausea, vomiting, or diarrhea. 02/18/2021 upon evaluation today patient's wound is actually showing signs of good improvement. I am very pleased with where things stand currently. I do not see any signs of active infection which is great news and overall I feel like she is making good progress. The patient likewise is happy that  things are doing so well and that this is measuring somewhat smaller. 02/25/2021 upon evaluation today patient actually appears to be doing quite well in regard to her wounds. She has been tolerating the dressing changes without complication. Fortunately there does not appear to be any signs of active infection which is great news and overall I am extremely pleased with where things stand. No fevers, chills, nausea, vomiting, or diarrhea. She does have a lot of edema I really do think she needs compression socks to be worn in order to prevent things from causing additional issues for her currently. Especially on the right leg she should be wearing compression socks all the time to be honest. 03/04/2021 upon evaluation today patient's leg ulcer actually appears to be doing pretty well which is great news. There does not appear to be any significant change but overall the appearance is better even though the size is not necessarily reflecting a great improvement. Fortunately there does not appear to be any signs of active infection. No fevers, chills, nausea, vomiting, or diarrhea. 03/12/2021 upon evaluation today patient appears to be doing well with regard to her wounds. She has been tolerating the dressing changes without complication. The good news is that the wound on her left lateral leg is doing much better and is showing signs of good improvement. Overall her swelling is controlled well as well. She does need some compression socks she plans to go Jefferey socks next week. 03/18/2021 on evaluation today patient's wound actually is showing signs of good improvement. She does have a little bit of slough this can require some sharp debridement today. 03/25/2021 upon evaluation today patient appears to be doing well with regard to her wound on the left lateral leg. She has been tolerating the dressing changes without complication. Fortunately there does not appear to be any signs of active infection at  this time. No fever chills no 04/01/2021 upon evaluation today patient's wound is showing signs of improvement and overall very pleased with where things stand at this point. There is no evidence of active infection which is great news as well and in general I think that she is making great progress. 04/09/2021 upon evaluation today patient appears to be doing well with regard to her ankle ulcer. She is making good progress currently which is great news. There does not appear to be any signs of infection also excellent news. In general I am extremely pleased with where things stand today. No fevers, chills, nausea, vomiting, or diarrhea. 04/23/2021 upon inspection today patient appears to be doing quite well with regard to her leg ulcer. She has been tolerating the dressing changes without complication. She is can require some sharp debridement today but overall seems to be doing excellent. 05/06/2021 upon evaluation today patient unfortunately appears to be doing poorly overall in regard to her swelling. She is actually significantly more swollen than last week despite the fact that her wound is doing better this is not a good trend.  I think that she probably needs to see her cardiologist ASAP and I discussed that with her today. This includes the fact that honestly I think she probably is getting need an increase in her diuretic or something to try to clear away some of the excess fluid that she is experiencing at the moment. She is not been doing her pumps even twice a day but again right now more concerned that if she were to do the pump she would fluid overload her lungs causing other issues as far as her breathing is concerned. Obviously I think she needs to contact them and move up the appointment for the 12th of something sooner 2 weeks is a bit too long to wait with how swollen she has. 05/14/2021 upon evaluation today patient's wound actually showing signs of being a little bit smaller compared to  previous. She has been making good progress however. Fortunately there does not appear to be any evidence of active infection which is great. Overall I am extremely pleased in that regard. Nonetheless I am still concerned that she is having quite a bit of issue as far as the swelling is concerned she has been placed on fluid restriction as well as an increase in the furosemide by her doctor. We will see how things progress. 05/21/2021 upon evaluation today patient appears to be doing well with regard to her wound in general. She has been tolerating the dressing changes without complication. With that being said she has been using silver cell for a while and while this was showing signs of improvement it is now gotten to the point where this has slowed down quite significantly. I do believe that switching to a different dressing may be beneficial for her today. 05/28/2021 upon evaluation today patient actually appears to be doing quite well in regard to her wound. In fact there is really not any need for sharp debridement today based on what I see. The Hydrofera Blue is doing great and overall I think that she is managing quite nicely with the compression wrapping. Her edema is still down quite a bit with the wrapping in place. 06/11/2021 upon evaluation today patient appears to be doing well with regard to her leg ulcer. She has been tolerating the dressing changes without complication. Fortunately there does not appear to be any signs of active infection at this time which is great news. No fevers, chills, nausea, vomiting, or diarrhea. She is going require some sharp debridement today. 10/11; left leg ulcer much better looking and with improved surface area. She is using Hydrofera Blue under 3 layer compression. She does not have any stocking on the right leg which in itself has very significant nonpitting edema 06/25/2021 upon evaluation today patient appears to be doing well with regard to the wound  on her left leg. She has been tolerating the dressing changes without complication and overall I am extremely pleased with where things stand today. I do not see any evidence of infection which is great news. 07/02/2021 upon evaluation today patient's wound actually showing signs of excellent improvement and actually very pleased with where we stand today and the overall appearance. Fortunately there does not appear to be any signs of active infection. No fevers, chills, nausea, vomiting, or diarrhea. 07/09/2021 upon evaluation today patient appears to be doing better in regard to her wound this is measuring smaller and she is headed in the appropriate direction based on what I am seeing currently. I do not see any evidence  of active infection systemically which is great news. 07/16/2021 upon evaluation today patient appears to be doing pretty well currently in regard to her leg ulcer. This is measuring smaller and looking much better this is great news. Fortunately there does not appear to be any evidence of active infection systemically which is great news [name], [name] ([id number]) as well. 07/23/2021 upon evaluation today patient's wound is actually showing signs of good improvement this is definitely measuring smaller. I do not see any signs of infection which is great news and overall very pleased in that regard. Overall I think that she is doing well with the [facility]. 07/30/2020 upon evaluation today patient appears to be doing well in regard to her wound. She has been tolerating the dressing changes without complication think the Hydrofera Blue at this point is actually causing her to dry out a lot as far as the wound bed is concerned. Subsequently I think that we need to try to see what we can do to improve this. Fortunately there does not appear to be any evidence of active infection at this time which is great news. 08/06/2021 upon evaluation today patient appears to be doing  well with regards to her wound. Is not measuring significantly smaller initial inspection but upon closer inspection she has a lot of new skin growth across the central portion of the wound. I think will get very close to complete resolution. 08/13/2021 upon evaluation today patient appears to be doing well with regard to her wound. In fact this appears to be I believe healed although there is still some question as to whether it is completely so. I am concerned about the fact that to be honest she still has some evidence of dry skin that could be just trapping something underneath I still want to debride anything as I am afraid of causing some damage to the good skin for that reason I Georgina Peer probably monitor for 1 more week before completely closing everything out. 08/20/2021 upon evaluation today patient actually appears to be doing excellent. In fact she appears to be completely healed. This was the case last week as well we just wanted to make sure nothing reopened since she does not really have an ability to put on compression socks this is good to be something we need to make sure was well-healed. Nonetheless I think we have achieved that goal as of today. 12/27; this is a patient we discharged 2 weeks ago with wounds on her left lower leg laterally. She was supposed to get stockings and really did not get them. She developed blistering and reopening probably sometime late last week. She has 3 areas in the same area as previously. The mid calf dimensions on the left went from 39 to 51 cm today. She also has very significant edema on the right leg but as far as I am aware has not had wounds in this area 1/4; patient presents for follow-up. She has tolerated the compression wrap well. She has no issues or complaints today. 09/17/2021 upon evaluation today patient appears to be doing well with regard to her wounds. She has been tolerating the dressing changes without complication. Fortunately there  does not appear to be any signs of active infection at this time. No fevers, chills, nausea, vomiting, or diarrhea. 10/01/2021 upon evaluation today patient appears to be doing well with regard to her wound this is actually measuring better and looking smaller and I am very pleased in that regard. Fortunately I do  not see any evidence of active infection locally nor systemically at this point. No fevers, chills, nausea, vomiting, or diarrhea. 10/08/2021 upon evaluation patient's wounds are actually showing some signs of improvement measuring a little bit smaller and looking a little bit better. Overall I do not think there is any need for sharp debridement today which is good news. No fevers, chills, nausea, vomiting, or diarrhea. 10/14/2021 upon evaluation today patient appears to be doing well with regard to her wound. She has been tolerating the dressing changes without complication and overall I am extremely pleased with where things stand today. There is a little bit of film on the surface of the wounds but this was carefully cleaned away with just saline and gauze she really did not want me to do any sharp debridement today. 10/29/2021 upon evaluation today patient appears to be doing quite well in regard to her wounds. She is actually showing signs of excellent improvement which is great news and overall I feel like we are on the right track at this time. She does have a wound on both legs and she does need something compression wise when she heals for that reason we will get a go and see about ordering her bilateral juxta lite compression wraps. 11/05/2021 upon evaluation today patient appears to be doing well with regard to her wound. She has been tolerating the dressing changes without complication. Fortunately I do not see any evidence of active infection locally nor systemically at this time which is great news. No fevers, chills, nausea, vomiting, or diarrhea. 11/12/2021 upon evaluation today  patient appears to be doing well with regard to her wounds. She is doing excellent on the left on the right this is not doing quite as well there is some slough and biofilm buildup. 3/14; patient presents for follow-up. She has no issues or complaints today. She has tolerated the compression wraps well. 11/26/2021 upon evaluation today patient appears to be doing well with regard to her wounds. Both are showing signs of good improvement which is great news. I do not see any evidence of active infection and overall think she is healing quite nicely. 12/03/2021 upon evaluation today patient appears to be doing well with regard to her wounds. Both are showing signs of excellent improvement I am very pleased with where things stand and I think that she is making good progress here. Fortunately I do not see any signs of active infection locally or systemically which is great news. No fevers, chills, nausea, vomiting, or diarrhea. 12-10-2021 upon evaluation today patient's wounds actually appear to be doing awesome. I am extremely pleased with where we stand I think she is making excellent progress. I do not see any signs of active infection locally or systemically at this time. 12-17-2021 upon evaluation today patient's wounds are actually showing signs of good improvement. Fortunately I do not see any evidence of active infection locally nor systemically which is great news. Overall I do not believe that she is showing any signs of significant worsening which is good news. With that being said I do believe that she has a little bit of film on the surface of the wound I was actually able to clear this away with saline and gauze no sharp debridement was necessary. 12-24-2021 upon evaluation today patient appears to be doing well with regard to her wounds. I do not see any signs of active infection currently which is great news and overall I think that we are on the right  track here. No fevers, chills, nausea,  vomiting, or diarrhea. 12-31-2021 upon evaluation today patient appears to be doing well with regard to her wounds. Both are showing signs of improvement the wound [name], [name] ([id number]) on the right leg is going require some sharp debridement on the left seems to be doing quite well. 01-07-2022 upon evaluation today patient appears to be doing well currently in regard to her wounds. She is actually making good progress bilaterally. Both of them require little bit of sharp debridement but not too much which is great news. Upon inspection patient's wound bed actually showed signs of good granulation and epithelization on both legs. She still is having significant issues with the right leg compared to the left although I do feel like both are actually showing signs of pretty good improvement. 01-21-2022 upon evaluation today patient's wound is actually showing signs of excellent improvement I am very pleased with where we stand currently. I do not see any signs of active infection locally or systemically which is great news. No fevers, chills, nausea, vomiting, or diarrhea. 01-28-2022 upon evaluation today patient appears to be doing well with regard to her wound on the left leg this is pretty much just about healed which is great news. Unfortunately in regard to the wound on her right leg this is not doing nearly as well in fact I think we probably need to see about a culture today that was discussed with her. 02-04-2022 upon evaluation today patient appears to be doing well with regard to her left leg which in fact might actually be completely healed I am not 100% sure. Nonetheless this is shown signs of excellent improvement which is great news. With that being said in regards to the right leg there is some slough and film buildup on the surface of the wound although she is feeling much better than last week this does appear to be doing much better than it was last week. Fortunately there does not  appear to be any signs of active infection locally or systemically at this time. 02-11-2022 upon evaluation today patient's wounds appear to be doing decently well. In fact the left leg is healed although there is brand-new skin were definitely given still need to wrap her for the time being. On the right leg this is showing signs of some epithelial growth in the middle of the wound it still measures the same because there is still a larger area with speckled openings but again as far as the entire area being open this is significantly improved which is great news. I am very pleased. 6/13; left leg remains healed and she has a juxta light to apply to it today. There is some concern about whether she is going to be able to do this at home. She lives with a daughter although she has her own disability. On the right the wound looks smaller to me nice epithelialization good edema control. 02-25-2022 upon evaluation today patient appears to be doing well with regard to her legs the left leg is still healed the right leg is doing much better. I think we are on the right track here. With that being said unfortunately she continues to have significant issues with some other problems and she actually tells me on Saturday she was having lunch and went inside to sit down when she tells me she had all of a sudden a sharp sensation that went from her hand through her arm into her leg and her neck and  she tells me that she could not see anything for the next hour it was completely black. Slowly after this her vision started to return and has been normal since. It sounds to me as if she may have had a TIA or mini stroke at this point. Nonetheless I think that she needs to have this checked out ASAP. Her primary care provider is Dr. Clide Deutscher at [facility]. We will get a try to get in touch with him. 03-10-2022 upon evaluation today patient's wound actually showed signs of good improvement. She seems to be making excellent  progress here. I do not see any evidence of active infection locally or systemically which is great news. No fevers, chills, nausea, vomiting, or diarrhea. She was in the hospital since have seen her last she ended up finding out that she was having syncopal episodes. That is what was going on with the blacking out and losing vision. Subsequently they adjusted some of her medication she seems to be doing better. 03-18-2022 upon evaluation today patient's wound is actually showing signs of good granulation and epithelization at this point. Fortunately I do not see any signs of active infection locally or systemically which is great news. 03-25-2022 any evidence of active infection locally or systemically which is great news. Any evidence of active infection which is great news and overall the wound is looking better. Upon evaluation today patient appears to be doing well with regard to her leg ulcer. She has been tolerating the dressing changes without complication. Fortunately I do not see 7/25; the patient's wound is on the right lateral lower extremity using silver alginate 3 layer compression. She has chronic venous insufficiency and lymphedema. We are using a juxta lite on the left leg which does not have any open wounds but she is leaving this on all week it seems that there just is not a way to get this changed at home 04-08-2022 upon evaluation today patient appears to be doing well currently in regard to her wound. She has been tolerating the dressing changes without complication. Fortunately I do not see any evidence of active infection locally or systemically which is great news. No fever or chills noted her left leg has reopened at this point. Objective Constitutional Well-nourished and well-hydrated in no acute distress. Vitals Time Taken: 2:15 PM, Height: 69 in, Weight: 244 lbs, BMI: 36, Temperature: 98.2 F, Pulse: 66 bpm, Respiratory Rate: 18 breaths/min, Blood Pressure: 122/61  mmHg. Respiratory normal breathing without difficulty. Psychiatric AMIYAH, SHRYOCK ([id number]) this patient is able to make decisions and demonstrates good insight into disease process. Alert and Oriented x 3. pleasant and cooperative. General Notes: Upon inspection patient's wound bed actually showed signs of good granulation and epithelization at this point. Fortunately I do not see any evidence of infection locally or systemically which is great news and overall I am extremely pleased with where things stand currently. Integumentary (Hair, Skin) Wound #4 status is Open. Original cause of wound was Gradually Appeared. The date acquired was: 10/27/2021. The wound has been in treatment 23 weeks. The wound is located on the Right,Lateral Lower Leg. The wound measures 5cm length x 6cm width x 0.1cm depth; 23.562cm^2 area and 2.356cm^3 volume. There is no tunneling or undermining noted. There is a medium amount of serosanguineous drainage noted. There is medium (34-66%) pink granulation within the wound bed. There is a medium (34-66%) amount of necrotic tissue within the wound bed including Adherent Slough. Wound #5 status is Open. Original cause of  wound was Blister. The date acquired was: 04/04/2022. The wound is located on the Left Lower Leg. The wound measures 3cm length x 26cm width x 0.1cm depth; 61.261cm^2 area and 6.126cm^3 volume. There is Fat Layer (Subcutaneous Tissue) exposed. There is no tunneling or undermining noted. There is a medium amount of serosanguineous drainage noted. There is medium (34-66%) red, pink granulation within the wound bed. There is a medium (34-66%) amount of necrotic tissue within the wound bed. 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 There was a Three Layer Compression Therapy Procedure by Carlene Coria, RN. Post procedure Diagnosis Wound #4: Same as Pre-Procedure Wound #5 Pre-procedure diagnosis of Wound #5 is a Venous Leg Ulcer located on the Left Lower Leg . There was a Three Layer Compression Therapy Procedure by Carlene Coria, RN. Post procedure Diagnosis Wound #5: Same as Pre-Procedure Plan Follow-up Appointments: Return Appointment in 1 week. Nurse Visit as needed Bathing/ Shower/ Hygiene: May shower with wound dressing protected with water repellent cover or cast protector. No tub bath. Anesthetic (Use 'Patient Medications' Section for Anesthetic Order Entry): Lidocaine applied to wound bed Edema Control - Lymphedema / Segmental Compressive Device / Other: Optional: One layer of unna paste to top of compression wrap (to act as an anchor). - she needs this Patient to wear own Velcro compression garment. Remove compression stockings every night before going to bed and put on every morning when getting up. - left leg Elevate, Exercise Daily and Avoid Standing for Long Periods of Time. Elevate legs to the level of the heart and pump ankles as often as possible Elevate leg(s) parallel to the floor when sitting. Compression Pump: Use compression pump on left lower extremity for 60 minutes, twice daily. - You may pump over wraps Compression Pump: Use compression pump on right lower extremity for 60 minutes, twice daily. - You may pump over wraps Additional Orders / Instructions: Follow Nutritious Diet and Increase Protein Intake WOUND #4: - Lower Leg Wound Laterality: Right, Lateral [name], [name] ([id number]) Cleanser: Soap and Water 1 x Per Week/30 Days Discharge Instructions: Gently cleanse wound with antibacterial soap, rinse and pat dry prior to dressing wounds Cleanser: Wound Cleanser 1 x Per Week/30  Days Discharge Instructions: Wash your hands with soap and water. Remove old dressing, discard into plastic bag and place into trash. Cleanse the wound with Wound Cleanser prior to applying a clean dressing using gauze sponges, not tissues or cotton balls. Do not scrub or use excessive force. Pat dry using gauze sponges, not tissue or cotton balls. Primary Dressing: Silvercel Small 2x2 (in/in) 1 x Per Week/30 Days Discharge Instructions: Apply Silvercel Small 2x2 (in/in) as instructed Secondary Dressing: ABD Pad 5x9 (in/in) 1 x Per Week/30 Days Discharge Instructions: Cover with ABD pad Compression Wrap: 3-LAYER WRAP - Profore Lite LF 3 Multilayer Compression Bandaging System 1 x Per Week/30 Days Discharge Instructions: Apply 3 multi-layer wrap as prescribed. Compression Stockings: Circaid Juxta Lite Compression Wrap Compression Amount: 20-30 mmHg (right) Discharge Instructions: Apply Circaid Juxta Lite Compression Wrap as directed WOUND #5: - Lower Leg Wound Laterality: Left Cleanser: Soap and Water 1 x Per Week/30 Days Discharge Instructions: Gently cleanse wound with antibacterial soap, rinse and pat dry prior to  dressing wounds Cleanser: Wound Cleanser 1 x Per Week/30 Days Discharge Instructions: Wash your hands with soap and water. Remove old dressing, discard into plastic bag and place into trash. Cleanse the wound with Wound Cleanser prior to applying a clean dressing using gauze sponges, not tissues or cotton balls. Do not scrub or use excessive force. Pat dry using gauze sponges, not tissue or cotton balls. Primary Dressing: Xeroform 5x9-HBD (in/in) 1 x Per Week/30 Days Discharge Instructions: Apply Xeroform 5x9-HBD (in/in) as directed Secondary Dressing: ABD Pad 5x9 (in/in) 1 x Per Week/30 Days Discharge Instructions: Cover with ABD pad Compression Wrap: 3-LAYER WRAP - Profore Lite LF 3 Multilayer Compression Bandaging System 1 x Per Week/30 Days Discharge Instructions: Apply 3  multi-layer wrap as prescribed. 1. I am going to recommend that we go ahead and have the patient continue with the bilateral compression wraps this seems to keep things under control. 2. I would recommend Xeroform for the left leg and on the right leg we are still using the silver alginate dressing. We can see how things do over the next week in that regard. 3. I would also recommend she should continue to elevate her legs much as possible to try to help with edema and I think overall the more of this she can do the better as far as I am concerned. Patient voiced understanding. We will see patient back for reevaluation in 1 week here in the clinic. If anything worsens or changes patient will contact our office for additional recommendations. Electronic Signature(s) Signed: 04/10/2022 6:21:39 PM By: Worthy Keeler PA-C Entered By: Worthy Keeler on 04/10/2022 18:21:39 [name] ([id number]) -------------------------------------------------------------------------------- SuperBill Details Patient Name: [name] Date of Service: 04/08/2022 Medical Record Number: [id number] Patient Account Number: [id number] Date of Birth/Sex: 18-Oct-1945 (76 y.o. F) Treating RN: Carlene Coria Primary Care Provider: [name] Other Clinician: Referring Provider: [name] Treating Provider/Extender: Skipper Cliche in Treatment: 63 Diagnosis Coding ICD-10 Codes Code Description I89.0 Lymphedema, not elsewhere classified I87.332 Chronic venous hypertension (idiopathic) with ulcer and inflammation of left lower extremity L97.812 Non-pressure chronic ulcer of other part of right lower leg with fat layer exposed Facility Procedures CPT4: Description Modifier Quantity Code [id number] BILATERAL: Application of multi-layer venous compression system; leg (below knee), including 1 ankle and foot. Physician Procedures CPT4 Code Description: [id number] - WC PHYS LEVEL 3 - EST  PT Modifier: Quantity: 1 CPT4 Code Description: ICD-10 Diagnosis Description I89.0 Lymphedema, not elsewhere classified I87.332 Chronic venous hypertension (idiopathic) with ulcer and inflammation of L97.812 Non-pressure chronic ulcer of other part of right lower leg with fat  lay Modifier: left lower extremity er exposed Quantity: Electronic Signature(s) Signed: 04/10/2022 6:21:52 PM By: Worthy Keeler PA-C Previous Signature: 04/08/2022 3:56:54 PM Version By: Carlene Coria RN Entered By: Worthy Keeler on 04/10/2022 18:21:52

## 2022-04-15 ENCOUNTER — Encounter: Payer: Medicare Other | Admitting: Physician Assistant

## 2022-04-15 DIAGNOSIS — L97812 Non-pressure chronic ulcer of other part of right lower leg with fat layer exposed: Secondary | ICD-10-CM | POA: Diagnosis not present

## 2022-04-15 NOTE — Progress Notes (Signed)
[name], [name] ([id number]) Visit Report for 04/15/2022 Arrival Information Details Patient Name: [name], [name] Date of Service: 04/15/2022 2:00 PM Medical Record Number: [id number] Patient Account Number: [id number] Date of Birth/Sex: [birth year] (76 y.o. F) Treating RN: Carlene Coria Primary Care Andi Layfield: [name] Other Clinician: Referring Valeska Haislip: [name] Treating Brad Mcgaughy/Extender: Skipper Cliche in Treatment: 13 Visit Information History Since Last Visit All ordered tests and consults were completed: No Patient Arrived: Wheel Chair Added or deleted any medications: No Arrival Time: 14:02 Any new allergies or adverse reactions: No Accompanied By: self Had a fall or experienced change in No Transfer Assistance: None activities of daily living that may affect Patient Identification Verified: Yes risk of falls: Secondary Verification Process Completed: Yes Signs or symptoms of abuse/neglect since last visito No Patient Requires Transmission-Based Precautions: No Hospitalized since last visit: No Patient Has Alerts: Yes Implantable device outside of the clinic excluding No Patient Alerts: NOT DIABETIC cellular tissue based products placed in the center since last visit: Has Dressing in Place as Prescribed: Yes Has Compression in Place as Prescribed: Yes Pain Present Now: No Electronic Signature(s) Unsigned Entered ByCarlene Coria on 04/15/2022 14:10:57 Signature(s): Date(s): [name] ([id number]) -------------------------------------------------------------------------------- Clinic Level of Care Assessment Details Patient Name: [name], [name] Date of Service: 04/15/2022 2:00 PM Medical Record Number: [id number] Patient Account Number: [id number] Date of Birth/Sex: 1946/03/10 (76 y.o. F) Treating RN: Carlene Coria Primary Care Khriz Liddy: [name] Other Clinician: Referring Soren Pigman: [name] Treating Khaalid Lefkowitz/Extender: Skipper Cliche  in Treatment: 64 Clinic Level of Care Assessment Items TOOL 1 Quantity Score '[]'$  - Use when EandM and Procedure is performed on INITIAL visit 0 ASSESSMENTS - Nursing Assessment / Reassessment '[]'$  - General Physical Exam (combine w/ comprehensive assessment (listed just below) when performed on new 0 pt. evals) '[]'$  - 0 Comprehensive Assessment (HX, ROS, Risk Assessments, Wounds Hx, etc.) ASSESSMENTS - Wound and Skin Assessment / Reassessment '[]'$  - Dermatologic / Skin Assessment (not related to wound area) 0 ASSESSMENTS - Ostomy and/or Continence Assessment and Care '[]'$  - Incontinence Assessment and Management 0 '[]'$  - 0 Ostomy Care Assessment and Management (repouching, etc.) PROCESS - Coordination of Care '[]'$  - Simple Patient / Family Education for ongoing care 0 '[]'$  - 0 Complex (extensive) Patient / Family Education for ongoing care '[]'$  - 0 Staff obtains Programmer, systems, Records, Test Results / Process Orders '[]'$  - 0 Staff telephones HHA, Nursing Homes / Clarify orders / etc '[]'$  - 0 Routine Transfer to another Facility (non-emergent condition) '[]'$  - 0 Routine Hospital Admission (non-emergent condition) '[]'$  - 0 New Admissions / Biomedical engineer / Ordering NPWT, Apligraf, etc. '[]'$  - 0 Emergency Hospital Admission (emergent condition) PROCESS - Special Needs '[]'$  - Pediatric / Minor Patient Management 0 '[]'$  - 0 Isolation Patient Management '[]'$  - 0 Hearing / Language / Visual special needs '[]'$  - 0 Assessment of Community assistance (transportation, D/C planning, etc.) '[]'$  - 0 Additional assistance / Altered mentation '[]'$  - 0 Support Surface(s) Assessment (bed, cushion, seat, etc.) INTERVENTIONS - Miscellaneous '[]'$  - External ear exam 0 '[]'$  - 0 Patient Transfer (multiple staff / Civil Service fast streamer / Similar devices) '[]'$  - 0 Simple Staple / Suture removal (25 or less) '[]'$  - 0 Complex Staple / Suture removal (26 or more) '[]'$  - 0 Hypo/Hyperglycemic Management (do not check if billed separately) '[]'$  -  0 Ankle / Brachial Index (ABI) - do not check if billed separately Has the patient been seen at the hospital within the last three years: Yes Total  Score: 0 Level Of Care: ____ [name] ([id number]) Electronic Signature(s) Unsigned Entered By: Carlene Coria on 04/15/2022 14:46:43 Signature(s): Date(s): [name] ([id number]) -------------------------------------------------------------------------------- Compression Therapy Details Patient Name: [name], [name] Date of Service: 04/15/2022 2:00 PM Medical Record Number: [id number] Patient Account Number: [id number] Date of Birth/Sex: 1946/05/09 (76 y.o. F) Treating RN: Carlene Coria Primary Care Cerenity Goshorn: [name] Other Clinician: Referring Ardyth Kelso: [name] Treating Laurina Fischl/Extender: Skipper Cliche in Treatment: 64 Compression Therapy Performed for Wound Assessment: Wound #4 Right,Lateral Lower Leg Performed By: Clinician Carlene Coria, RN Compression Type: Four Layer Post Procedure Diagnosis Same as Pre-procedure Electronic Signature(s) Unsigned Entered ByCarlene Coria on 04/15/2022 14:45:31 Signature(s): Date(s): [name] ([id number]) -------------------------------------------------------------------------------- Compression Therapy Details Patient Name: [name], [name] Date of Service: 04/15/2022 2:00 PM Medical Record Number: [id number] Patient Account Number: [id number] Date of Birth/Sex: [birth year] (76 y.o. F) Treating RN: Carlene Coria Primary Care Mindie Rawdon: [name] Other Clinician: Referring Bennett Ram: [name] Treating Violia Knopf/Extender: Skipper Cliche in Treatment: 64 Compression Therapy Performed for Wound Assessment: Wound #5 Left Lower Leg Performed By: Clinician Carlene Coria, RN Compression Type: Four Layer Post Procedure Diagnosis Same as Pre-procedure Electronic Signature(s) Unsigned Entered ByCarlene Coria on 04/15/2022  14:45:54 Signature(s): Date(s): [name] ([id number]) -------------------------------------------------------------------------------- Encounter Discharge Information Details Patient Name: [name], [name] Date of Service: 04/15/2022 2:00 PM Medical Record Number: [id number] Patient Account Number: [id number] Date of Birth/Sex: Jun 10, 1946 (76 y.o. F) Treating RN: Carlene Coria Primary Care Aylana Hirschfeld: [name] Other Clinician: Referring Atharva Mirsky: [name] Treating Gautam Langhorst/Extender: Skipper Cliche in Treatment: 17 Encounter Discharge Information Items Discharge Condition: Stable Ambulatory Status: Ambulatory Discharge Destination: Home Transportation: Private Auto Accompanied By: self Schedule Follow-up Appointment: Yes Clinical Summary of Care: Electronic Signature(s) Unsigned Entered ByCarlene Coria on 04/15/2022 14:47:49 Signature(s): Date(s): [name] ([id number]) -------------------------------------------------------------------------------- Lower Extremity Assessment Details Patient Name: [name], [name] Date of Service: 04/15/2022 2:00 PM Medical Record Number: [id number] Patient Account Number: [id number] Date of Birth/Sex: [birth year] (76 y.o. F) Treating RN: Carlene Coria Primary Care Jarelyn Bambach: [name] Other Clinician: Referring Magnolia Mattila: [name] Treating Vesna Kable/Extender: Skipper Cliche in Treatment: 64 Edema Assessment Assessed: [Left: No] [Right: No] [Left: Edema] [Right: :] Calf Left: Right: Point of Measurement: 31 cm From Medial Instep Ankle Left: Right: Point of Measurement: 10 cm From Medial Instep Vascular Assessment Pulses: Dorsalis Pedis Palpable: [Left:Yes] [Right:Yes] Electronic Signature(s) Unsigned Entered ByCarlene Coria on 04/15/2022 14:21:42 Signature(s): Date(s): [name] ([id number]) -------------------------------------------------------------------------------- Multi Wound Chart  Details Patient Name: [name], [name] Date of Service: 04/15/2022 2:00 PM Medical Record Number: [id number] Patient Account Number: [id number] Date of Birth/Sex: Sep 14, 1945 (76 y.o. F) Treating RN: Carlene Coria Primary Care Brysin Towery: [name] Other Clinician: Referring Tayah Idrovo: [name] Treating Platon Arocho/Extender: Skipper Cliche in Treatment: 64 Vital Signs Height(in): 36 Pulse(bpm): 26 Weight(lbs): 244 Blood Pressure(mmHg): 154/74 Body Mass Index(BMI): 36 Temperature(F): 98.2 Respiratory Rate(breaths/min): 18 Photos: [N/A:N/A] Wound Location: Right, Lateral Lower Leg Left Lower Leg N/A Wounding Event: Gradually Appeared Blister N/A Primary Etiology: Venous Leg Ulcer Venous Leg Ulcer N/A Comorbid History: Cataracts, Lymphedema, Sleep Cataracts, Lymphedema, Sleep N/A Apnea, Congestive Heart Failure, Apnea, Congestive Heart Failure, Hypertension, Osteoarthritis, Hypertension, Osteoarthritis, Neuropathy, Received Neuropathy, Received Chemotherapy, Received Radiation Chemotherapy, Received Radiation Date Acquired: 10/27/2021 04/04/2022 N/A Weeks of Treatment: 24 1 N/A Wound Status: Open Open N/A Wound Recurrence: No No N/A Measurements [name] x W x D (cm) 5.5x5.5x0.1 4.5x12x0.1 N/A Area (cm) : 23.758 42.412 N/A Volume (cm) : 2.376 4.241 N/A % Reduction in Area: -1916.80% 30.80% N/A %  Reduction in Volume: -1913.60% 30.80% N/A Classification: Full Thickness Without Exposed Full Thickness Without Exposed N/A Support Structures Support Structures Exudate Amount: Medium Medium N/A Exudate Type: Serosanguineous Serosanguineous N/A Exudate Color: red, brown red, brown N/A Granulation Amount: Medium (34-66%) Medium (34-66%) N/A Granulation Quality: Pink Red, Pink N/A Necrotic Amount: Medium (34-66%) Medium (34-66%) N/A Exposed Structures: Fat Layer (Subcutaneous Tissue): Fat Layer (Subcutaneous Tissue): N/A Yes Yes Fascia: No Fascia: No Tendon: No Tendon: No Muscle:  No Muscle: No Joint: No Joint: No Bone: No Bone: No Epithelialization: None None N/A Treatment Notes Electronic Signature(s) Unsigned Entered ByCarlene Coria on 04/15/2022 14:21:59 [name] ([id number]) Signature(s): Date(s): [name], [name] ([id number]) -------------------------------------------------------------------------------- Multi-Disciplinary Care Plan Details Patient Name: [name], [name] Date of Service: 04/15/2022 2:00 PM Medical Record Number: [id number] Patient Account Number: [id number] Date of Birth/Sex: December 07, 1945 (76 y.o. F) Treating RN: Carlene Coria Primary Care Jonty Morrical: [name] Other Clinician: Referring Theona Muhs: [name] Treating Royer Cristobal/Extender: Skipper Cliche in Treatment: 62 Active Inactive Electronic Signature(s) Unsigned Entered ByCarlene Coria on 04/15/2022 14:21:51 Signature(s): Date(s): LASHONA, SCHAAF ([id number]) -------------------------------------------------------------------------------- Pain Assessment Details Patient Name: [name], [name] Date of Service: 04/15/2022 2:00 PM Medical Record Number: [id number] Patient Account Number: [id number] Date of Birth/Sex: [birth year] (76 y.o. F) Treating RN: Carlene Coria Primary Care Sheree Lalla: [name] Other Clinician: Referring Norbert Malkin: [name] Treating Stehanie Ekstrom/Extender: Skipper Cliche in Treatment: 64 Active Problems Location of Pain Severity and Description of Pain Patient Has Paino No Site Locations Pain Management and Medication Current Pain Management: Electronic Signature(s) Unsigned Entered ByCarlene Coria on 04/15/2022 14:11:20 Signature(s): Date(s): [name] ([id number]) -------------------------------------------------------------------------------- Patient/Caregiver Education Details Patient Name: [name] Date of Service: 04/15/2022 2:00 PM Medical Record Number: [id number] Patient Account Number: [id number] Date of  Birth/Gender: [birth year] (76 y.o. F) Treating RN: Carlene Coria Primary Care Physician: [name] Other Clinician: Referring Physician: [name] Treating Physician/Extender: Skipper Cliche in Treatment: 50 Education Assessment Education Provided To: Patient Education Topics Provided Wound/Skin Impairment: Methods: Explain/Verbal Responses: State content correctly Electronic Signature(s) Unsigned Entered By: Carlene Coria on 04/15/2022 14:47:09 Signature(s): Date(s): [name] ([id number]) -------------------------------------------------------------------------------- Wound Assessment Details Patient Name: KIRSTAN, FENTRESS Date of Service: 04/15/2022 2:00 PM Medical Record Number: [id number] Patient Account Number: [id number] Date of Birth/Sex: Apr 05, 1946 (76 y.o. F) Treating RN: Carlene Coria Primary Care Reise Hietala: [name] Other Clinician: Referring Ahlayah Tarkowski: [name] Treating Olanna Percifield/Extender: Skipper Cliche in Treatment: 64 Wound Status Wound Number: 4 Primary Venous Leg Ulcer Etiology: Wound Location: Right, Lateral Lower Leg Wound Open Wounding Event: Gradually Appeared Status: Date Acquired: 10/27/2021 Comorbid Cataracts, Lymphedema, Sleep Apnea, Congestive Heart Weeks Of Treatment: 24 History: Failure, Hypertension, Osteoarthritis, Neuropathy, Clustered Wound: No Received Chemotherapy, Received Radiation Photos Wound Measurements Length: (cm) 5.5 Width: (cm) 5.5 Depth: (cm) 0.1 Area: (cm) 23.758 Volume: (cm) 2.376 % Reduction in Area: -1916.8% % Reduction in Volume: -1913.6% Epithelialization: None Tunneling: No Undermining: No Wound Description Classification: Full Thickness Without Exposed Support Structures Exudate Amount: Medium Exudate Type: Serosanguineous Exudate Color: red, brown Foul Odor After Cleansing: No Slough/Fibrino Yes Wound Bed Granulation Amount: Medium (34-66%) Exposed Structure Granulation Quality:  Pink Fascia Exposed: No Necrotic Amount: Medium (34-66%) Fat Layer (Subcutaneous Tissue) Exposed: Yes Necrotic Quality: Adherent Slough Tendon Exposed: No Muscle Exposed: No Joint Exposed: No Bone Exposed: No Treatment Notes Wound #4 (Lower Leg) Wound Laterality: Right, Lateral Cleanser Soap and Water Discharge Instruction: Gently cleanse wound with antibacterial soap, rinse and pat dry prior to dressing wounds Wound Cleanser [place], [place] ([id number]) Discharge Instruction: [place]  your hands with soap and water. Remove old dressing, discard into plastic bag and place into trash. Cleanse the wound with Wound Cleanser prior to applying a clean dressing using gauze sponges, not tissues or cotton balls. Do not scrub or use excessive force. Pat dry using gauze sponges, not tissue or cotton balls. Peri-Wound Care Topical Primary Dressing Silvercel Small 2x2 (in/in) Discharge Instruction: Apply Silvercel Small 2x2 (in/in) as instructed Secondary Dressing ABD Pad 5x9 (in/in) Discharge Instruction: Cover with ABD pad Secured With Compression Wrap 3-LAYER WRAP - Profore Lite LF 3 Multilayer Compression Bandaging System Discharge Instruction: Apply 3 multi-layer wrap as prescribed. Compression Stockings Circaid Juxta Lite Compression Wrap Quantity: 1 Right Leg Compression Amount: 20-30 mmHg Discharge Instruction: Apply Circaid Juxta Lite Compression Wrap as directed Add-Ons Electronic Signature(s) Unsigned Entered By: Carlene Coria on 04/15/2022 14:20:54 Signature(s): Date(s): [name] ([id number]) -------------------------------------------------------------------------------- Wound Assessment Details Patient Name: MISSY, BAKSH Date of Service: 04/15/2022 2:00 PM Medical Record Number: [id number] Patient Account Number: [id number] Date of Birth/Sex: 1946/03/01 (76 y.o. F) Treating RN: Carlene Coria Primary Care Jamen Loiseau: [name] Other Clinician: Referring  Teddrick Mallari: [name] Treating Sakoya Win/Extender: Skipper Cliche in Treatment: 64 Wound Status Wound Number: 5 Primary Venous Leg Ulcer Etiology: Wound Location: Left Lower Leg Wound Open Wounding Event: Blister Status: Date Acquired: 04/04/2022 Comorbid Cataracts, Lymphedema, Sleep Apnea, Congestive Heart Weeks Of Treatment: 1 History: Failure, Hypertension, Osteoarthritis, Neuropathy, Clustered Wound: No Received Chemotherapy, Received Radiation Photos Wound Measurements Length: (cm) 4.5 Width: (cm) 12 Depth: (cm) 0.1 Area: (cm) 42.412 Volume: (cm) 4.241 % Reduction in Area: 30.8% % Reduction in Volume: 30.8% Epithelialization: None Tunneling: No Undermining: No Wound Description Classification: Full Thickness Without Exposed Support Structu Exudate Amount: Medium Exudate Type: Serosanguineous Exudate Color: red, brown res Foul Odor After Cleansing: No Slough/Fibrino Yes Wound Bed Granulation Amount: Medium (34-66%) Exposed Structure Granulation Quality: Red, Pink Fascia Exposed: No Necrotic Amount: Medium (34-66%) Fat Layer (Subcutaneous Tissue) Exposed: Yes Tendon Exposed: No Muscle Exposed: No Joint Exposed: No Bone Exposed: No Treatment Notes Wound #5 (Lower Leg) Wound Laterality: Left Cleanser Soap and Water Discharge Instruction: Gently cleanse wound with antibacterial soap, rinse and pat dry prior to dressing wounds Wound Cleanser ASTRAEA, GAUGHRAN ([id number]) Discharge Instruction: Wash your hands with soap and water. Remove old dressing, discard into plastic bag and place into trash. Cleanse the wound with Wound Cleanser prior to applying a clean dressing using gauze sponges, not tissues or cotton balls. Do not scrub or use excessive force. Pat dry using gauze sponges, not tissue or cotton balls. Peri-Wound Care Topical Primary Dressing Xeroform 5x9-HBD (in/in) Discharge Instruction: Apply Xeroform 5x9-HBD (in/in) as directed Secondary  Dressing ABD Pad 5x9 (in/in) Discharge Instruction: Cover with ABD pad Secured With Compression Wrap 3-LAYER WRAP - Profore Lite LF 3 Multilayer Compression Bandaging System Discharge Instruction: Apply 3 multi-layer wrap as prescribed. Compression Stockings Add-Ons Electronic Signature(s) Unsigned Entered By: Carlene Coria on 04/15/2022 14:21:14 Signature(s): Date(s): MCKENZYE, CUTRIGHT ([id number]) -------------------------------------------------------------------------------- Vitals Details Patient Name: [name] Date of Service: 04/15/2022 2:00 PM Medical Record Number: [id number] Patient Account Number: [id number] Date of Birth/Sex: [birth year] (76 y.o. F) Treating RN: Carlene Coria Primary Care Avry Montele[place]: [name] Other Clinician: Referring Libertie Hausler: [name] Treating Arilynn Blakeney/Extender: Skipper Cliche in Treatment: 64 Vital Signs Time Taken: 14:10 Temperature (F): 98.2 Height (in): 69 Pulse (bpm): 69 Weight (lbs): 244 Respiratory Rate (breaths/min): 18 Body Mass Index (BMI): 36 Blood Pressure (mmHg): 154/74 Reference Range: 80 - 120 mg / dl Electronic Signature(s) Unsigned Entered  ByCarlene Coria on 04/15/2022 14:11:13 Signature(s): Date(s):

## 2022-04-15 NOTE — Progress Notes (Signed)
CAIDEN, MONSIVAIS ([id number]) Visit Report for 04/15/2022 Chief Complaint Document Details Patient Name: [name], [name] Date of Service: 04/15/2022 2:00 PM Medical Record Number: [id number] Patient Account Number: [id number] Date of Birth/Sex: [birth year] (76 y.o. F) Treating RN: Carlene Coria Primary Care Provider: [name] Other Clinician: Referring Provider: [name] Treating Provider/Extender: Skipper Cliche in Treatment: 57 Information Obtained from: Patient Chief Complaint Bilateral LE Ulcers Electronic Signature(s) Signed: 04/15/2022 1:44:29 PM By: Worthy Keeler PA-C Entered By: Worthy Keeler on 04/15/2022 13:44:28 [name] ([id number]) -------------------------------------------------------------------------------- Physician Orders Details Patient Name: [name] Date of Service: 04/15/2022 2:00 PM Medical Record Number: [id number] Patient Account Number: [id number] Date of Birth/Sex: Jan 18, 1946 (76 y.o. 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 o Nurse Visit as needed Bathing/ Shower/ Hygiene o May shower with wound dressing protected with water repellent cover or cast protector. o No tub bath. Anesthetic (Use 'Patient Medications' Section for Anesthetic Order Entry) o Lidocaine applied to wound bed Edema Control - Lymphedema / Segmental Compressive Device / Other Bilateral Lower Extremities o Optional: One layer of unna paste to top of  compression wrap (to act as an anchor). - she needs this o Patient to wear own Velcro compression garment. Remove compression stockings every night before going to bed and put on every morning when getting up. - left leg o Elevate, Exercise Daily and Avoid Standing for Long Periods of Time. o Elevate legs to the level of the heart and pump ankles as often as possible o Elevate leg(s) parallel to the floor when sitting. o Compression Pump: Use compression pump on left lower extremity for 60 minutes, twice daily. - You may pump over wraps o Compression Pump: Use compression pump on right lower extremity for 60 minutes, twice daily. - You may pump over wraps Additional Orders / Instructions o Follow Nutritious Diet and Increase Protein Intake Wound Treatment Wound #4 - Lower Leg Wound Laterality: Right, Lateral Cleanser: Soap and Water 1 x Per Week/30 Days Discharge Instructions: Gently cleanse wound with antibacterial soap, rinse and pat dry prior to dressing wounds Cleanser: Wound Cleanser 1 x Per Week/30 Days Discharge Instructions: Wash your hands with soap and water. Remove old dressing, discard into plastic bag and place into trash. Cleanse the wound with Wound Cleanser prior to applying a clean dressing using gauze sponges, not tissues or cotton balls. Do not scrub or use excessive force. Pat dry using gauze sponges, not tissue or cotton balls. Primary Dressing: Silvercel Small 2x2 (in/in) 1 x Per Week/30 Days Discharge Instructions: Apply Silvercel Small 2x2 (in/in) as instructed Secondary Dressing: ABD Pad 5x9 (in/in) 1 x Per Week/30 Days Discharge Instructions: Cover with ABD pad Compression Wrap: 3-LAYER WRAP - Profore Lite LF 3 Multilayer Compression Bandaging System 1 x Per Week/30 Days Discharge Instructions: Apply 3 multi-layer wrap as prescribed. Compression Stockings: Circaid Juxta Lite Compression Wrap Right Leg Compression Amount: 20-30 mmHG Discharge  Instructions: Apply Circaid Juxta Lite Compression Wrap as directed Wound #5 - Lower Leg Wound Laterality: Left TALEA, MANGES ([id number]) Cleanser: Soap and Water 1 x Per Week/30 Days Discharge Instructions: Gently  cleanse wound with antibacterial soap, rinse and pat dry prior to dressing wounds Cleanser: Wound Cleanser 1 x Per Week/30 Days Discharge Instructions: Wash your hands with soap and water. Remove old dressing, discard into plastic bag and place into trash. Cleanse the wound with Wound Cleanser prior to applying a clean dressing using gauze sponges, not tissues or cotton balls. Do not scrub or use excessive force. Pat dry using gauze sponges, not tissue or cotton balls. Primary Dressing: Xeroform 5x9-HBD (in/in) 1 x Per Week/30 Days Discharge Instructions: Apply Xeroform 5x9-HBD (in/in) as directed Secondary Dressing: ABD Pad 5x9 (in/in) 1 x Per Week/30 Days Discharge Instructions: Cover with ABD pad Compression Wrap: 3-LAYER WRAP - Profore Lite LF 3 Multilayer Compression Bandaging System 1 x Per Week/30 Days Discharge Instructions: Apply 3 multi-layer wrap as prescribed. Electronic Signature(s) Unsigned Entered By: Carlene Coria on 04/15/2022 14:46:30 Signature(s): Date(s): DIANIA, CO ([id number]) -------------------------------------------------------------------------------- Problem List Details Patient Name: [name] Date of Service: 04/15/2022 2:00 PM Medical Record Number: [id number] Patient Account Number: [id number] Date of Birth/Sex: July 07, 1946 (76 y.o. F) Treating RN: Carlene Coria Primary Care Provider: [name] Other Clinician: Referring Provider: [name] Treating Provider/Extender: Skipper Cliche in Treatment: 64 Active Problems ICD-10 Encounter Code Description Active Date MDM Diagnosis I89.0 Lymphedema, not elsewhere classified 01/16/2021 No Yes I87.332 Chronic venous hypertension (idiopathic) with ulcer and inflammation of  01/16/2021 No Yes left lower extremity L97.812 Non-pressure chronic ulcer of other part of right lower leg with fat layer 01/07/2022 No Yes exposed Inactive Problems ICD-10 Code Description Active Date Inactive Date L97.822 Non-pressure chronic ulcer of other part of left lower leg with fat layer exposed 01/16/2021 01/16/2021 Resolved Problems Electronic Signature(s) Signed: 04/15/2022 1:44:25 PM By: Worthy Keeler PA-C Entered By: Worthy Keeler on 04/15/2022 13:44:25 [name] ([id number]) -------------------------------------------------------------------------------- [facility] Details Patient Name: [name] Date of Service: 04/15/2022 Medical Record Number: [id number] Patient Account Number: [id number] Date of Birth/Sex: 06-Nov-1945 (76 y.o. 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 Electronic Signature(s) Unsigned Entered By: Carlene Coria on 04/15/2022 14:46:57 Signature(s): Date(s):

## 2022-04-22 ENCOUNTER — Encounter: Payer: Medicare Other | Admitting: Physician Assistant

## 2022-04-22 DIAGNOSIS — L97812 Non-pressure chronic ulcer of other part of right lower leg with fat layer exposed: Secondary | ICD-10-CM | POA: Diagnosis not present

## 2022-04-23 NOTE — Progress Notes (Addendum)
[name], [name] ([id number]) Visit Report for 04/22/2022 Chief Complaint Document Details Patient Name: [name] Date of Service: 04/22/2022 10:15 AM Medical Record Number: [id number] Patient Account Number: [id number] Date of Birth/Sex: Mar 04, 1946 (76 y.o. F) Treating RN: Carlene Coria Primary Care Provider: [name] Other Clinician: Referring Provider: [name] Treating Provider/Extender: Skipper Cliche in Treatment: 61 Information Obtained from: Patient Chief Complaint Bilateral LE Ulcers Electronic Signature(s) Signed: 04/22/2022 10:11:38 AM By: Worthy Keeler PA-C Entered By: Worthy Keeler on 04/22/2022 10:11:37 [name] ([id number]) -------------------------------------------------------------------------------- HPI Details Patient Name: [name] Date of Service: 04/22/2022 10:15 AM Medical Record Number: [id number] Patient Account Number: [id number] Date of Birth/Sex: 06-18-1946 (76 y.o. F) Treating RN: Carlene Coria Primary Care Provider: [name] Other Clinician: Referring Provider: [name] Treating Provider/Extender: Skipper Cliche in Treatment: 35 History of Present Illness HPI Description: 03/15/2020 upon evaluation today patient presents today for initial evaluation here in our clinic concerning a wound that is on the left posterior lower extremity. Unfortunately this has been giving the patient some discomfort at this point which she notes has been affected her pretty much daily. The patient does have a history of venous insufficiency, hypertension, congestive heart failure, and a history of breast cancer. Fortunately there does not appear to be evidence of active infection at this time which is great news. No fevers, chills, nausea, vomiting, or diarrhea. Wound is not extremely large but does have some slough covering the surface of the wound. This is going require sharp debridement today. 03/15/2020 upon evaluation today  patient appears to be doing fairly well in regard to her wound. She does have some slough noted on the surface of the wound currently but I do believe the Iodoflex has been beneficial over the past week. There is no signs of active infection at this time which is great news and overall very pleased with the progress. No fevers, chills, nausea, vomiting, or diarrhea. 04/02/2020 upon evaluation today patient appears to be doing a little better in regard to her wound size wise this is not tremendously smaller she does have some slough buildup on the surface of the wound. With that being said this is going require some sharp debridement today to clear away some of the necrotic debris. Fortunately there is no evidence of active infection at this time. No fevers, chills, nausea, vomiting, or diarrhea. 04/10/2020 on evaluation today patient appears to be doing well with regard to her ulcer which is measuring somewhat smaller today. She actually has more granulation tissue noted which is also good news is no need for sharp debridement today. Fortunately there is no evidence of infection either which is also excellent. No fevers, chills, nausea, vomiting, or diarrhea. 04/26/2020 on evaluation today patient's wound actually is showing signs of good improvement which is great news. There does not appear to be any evidence of active infection. I do believe she is tolerating the collagen at this point. 05/03/2020 upon evaluation today patient's wound actually showed signs of good granulation at this time there does not appear to be any evidence of active infection which is great news and overall very pleased with where things stand. With that being said I do believe that the patient is can require some sharp debridement today but fortunately nothing too significant. 05/11/20 on evaluation today patient appears to be doing well in regard to her leg ulcer. She's been tolerating the dressing changes without complication.  Fortunately there is no signs of active infection at this time.  Overall I'm very pleased with where things stand. 05/18/2020 upon evaluation today patient's wound is showing signs of improvement is very dry and I think the collagen for the most part just dried to the wound bed. I am going to clean this away with sharp debridement in order to get this under control in my opinion. 05/25/2020 upon evaluation today patient actually appears to be doing well in regard to her leg ulcer. In fact upon inspection it appears she could potentially be completely healed but that is not guaranteed based on what we are seeing. There does not appear to be any signs of active infection which is great news. 05/31/2020 on evaluation today patient appears to be doing well with regard to her wounds. She in fact appears to be almost completely healed on the left leg there is just a very small opening remaining point 1 06/08/2020 upon evaluation today patient appears to be doing excellent in regard to her leg ulcer on the left in fact it appears to be that she is completely healed as of today. I see no signs of active infection at this time which is great news. No fevers, chills, nausea, vomiting, or diarrhea. READMISSION 01/16/2021 Patient that we have had in this clinic with a wound on the left posterior calf in the summer 2021 extending into October. She was felt to have chronic venous insufficiency. Per the patient's description she was discharged with what sounds like external compression stockings although she could not afford the "$75 per leg". She therefore did not get anything. Apparently her wound that she has currently is been in existence since January. She has been followed at vein and vascular with Unna boots and I think calcium alginate. She had ABIs done on 06/25/2020 that showed an noncompressible ABI on the right leg and the left leg but triphasic waveforms with good great toe pressures and a TBI of 0.90 on the  right and 0.91 on the left Surprisingly the patient also had venous reflux studies that were really unremarkable. This included no evidence of DVTs or SVTs but there was no evidence of deep venous insufficiency or superficial venous insufficiency in the greater saphenous or short saphenous veins bilaterally. I would wonder about more central venous issues or perhaps this is all lymphedema 01/25/2021 upon evaluation today patient appears to be doing decently well and guard to her wound. The good news is the Iodoflex that is job she saw Dr. Dellia Nims last week for readmission and to be honest I think that this has done extremely well over that week. I do believe that she can tolerate the sharp debridement today which will be good. I think that cleaning off the wound will likely allow [place] to be able to use a different type of dressing I do not think the Iodoflex will even be necessary based on how clean the wound looks. Fortunately there is no sign of active infection at this time which is great news. No fevers, chills, nausea, vomiting, or diarrhea. 02/12/2021 upon evaluation today patient appears to be doing well with regard to her leg ulcer. We did switch to alginate when she came for nurse visit I think is doing much better for her. Fortunately there does not appear to be any signs of active infection at this time which is great news. No fevers, chills, nausea, vomiting, or diarrhea. [name], [name] ([id number]) 02/18/2021 upon evaluation today patient's wound is actually showing signs of good improvement. I am very pleased with where things stand  currently. I do not see any signs of active infection which is great news and overall I feel like she is making good progress. The patient likewise is happy that things are doing so well and that this is measuring somewhat smaller. 02/25/2021 upon evaluation today patient actually appears to be doing quite well in regard to her wounds. She has been tolerating the  dressing changes without complication. Fortunately there does not appear to be any signs of active infection which is great news and overall I am extremely pleased with where things stand. No fevers, chills, nausea, vomiting, or diarrhea. She does have a lot of edema I really do think she needs compression socks to be worn in order to prevent things from causing additional issues for her currently. Especially on the right leg she should be wearing compression socks all the time to be honest. 03/04/2021 upon evaluation today patient's leg ulcer actually appears to be doing pretty well which is great news. There does not appear to be any significant change but overall the appearance is better even though the size is not necessarily reflecting a great improvement. Fortunately there does not appear to be any signs of active infection. No fevers, chills, nausea, vomiting, or diarrhea. 03/12/2021 upon evaluation today patient appears to be doing well with regard to her wounds. She has been tolerating the dressing changes without complication. The good news is that the wound on her left lateral leg is doing much better and is showing signs of good improvement. Overall her swelling is controlled well as well. She does need some compression socks she plans to go Jefferey socks next week. 03/18/2021 on evaluation today patient's wound actually is showing signs of good improvement. She does have a little bit of slough this can require some sharp debridement today. 03/25/2021 upon evaluation today patient appears to be doing well with regard to her wound on the left lateral leg. She has been tolerating the dressing changes without complication. Fortunately there does not appear to be any signs of active infection at this time. No fever chills no 04/01/2021 upon evaluation today patient's wound is showing signs of improvement and overall very pleased with where things stand at this point. There is no evidence of active  infection which is great news as well and in general I think that she is making great progress. 04/09/2021 upon evaluation today patient appears to be doing well with regard to her ankle ulcer. She is making good progress currently which is great news. There does not appear to be any signs of infection also excellent news. In general I am extremely pleased with where things stand today. No fevers, chills, nausea, vomiting, or diarrhea. 04/23/2021 upon inspection today patient appears to be doing quite well with regard to her leg ulcer. She has been tolerating the dressing changes without complication. She is can require some sharp debridement today but overall seems to be doing excellent. 05/06/2021 upon evaluation today patient unfortunately appears to be doing poorly overall in regard to her swelling. She is actually significantly more swollen than last week despite the fact that her wound is doing better this is not a good trend. I think that she probably needs to see her cardiologist ASAP and I discussed that with her today. This includes the fact that honestly I think she probably is getting need an increase in her diuretic or something to try to clear away some of the excess fluid that she is experiencing at the moment. She is  not been doing her pumps even twice a day but again right now more concerned that if she were to do the pump she would fluid overload her lungs causing other issues as far as her breathing is concerned. Obviously I think she needs to contact them and move up the appointment for the 12th of something sooner 2 weeks is a bit too long to wait with how swollen she has. 05/14/2021 upon evaluation today patient's wound actually showing signs of being a little bit smaller compared to previous. She has been making good progress however. Fortunately there does not appear to be any evidence of active infection which is great. Overall I am extremely pleased in that regard. Nonetheless I am  still concerned that she is having quite a bit of issue as far as the swelling is concerned she has been placed on fluid restriction as well as an increase in the furosemide by her doctor. We will see how things progress. 05/21/2021 upon evaluation today patient appears to be doing well with regard to her wound in general. She has been tolerating the dressing changes without complication. With that being said she has been using silver cell for a while and while this was showing signs of improvement it is now gotten to the point where this has slowed down quite significantly. I do believe that switching to a different dressing may be beneficial for her today. 05/28/2021 upon evaluation today patient actually appears to be doing quite well in regard to her wound. In fact there is really not any need for sharp debridement today based on what I see. The Hydrofera Blue is doing great and overall I think that she is managing quite nicely with the compression wrapping. Her edema is still down quite a bit with the wrapping in place. 06/11/2021 upon evaluation today patient appears to be doing well with regard to her leg ulcer. She has been tolerating the dressing changes without complication. Fortunately there does not appear to be any signs of active infection at this time which is great news. No fevers, chills, nausea, vomiting, or diarrhea. She is going require some sharp debridement today. 10/11; left leg ulcer much better looking and with improved surface area. She is using Hydrofera Blue under 3 layer compression. She does not have any stocking on the right leg which in itself has very significant nonpitting edema 06/25/2021 upon evaluation today patient appears to be doing well with regard to the wound on her left leg. She has been tolerating the dressing changes without complication and overall I am extremely pleased with where things stand today. I do not see any evidence of infection which is great  news. 07/02/2021 upon evaluation today patient's wound actually showing signs of excellent improvement and actually very pleased with where we stand today and the overall appearance. Fortunately there does not appear to be any signs of active infection. No fevers, chills, nausea, vomiting, or diarrhea. 07/09/2021 upon evaluation today patient appears to be doing better in regard to her wound this is measuring smaller and she is headed in the appropriate direction based on what I am seeing currently. I do not see any evidence of active infection systemically which is great news. 07/16/2021 upon evaluation today patient appears to be doing pretty well currently in regard to her leg ulcer. This is measuring smaller and looking much better this is great news. Fortunately there does not appear to be any evidence of active infection systemically which is great news as well.  07/23/2021 upon evaluation today patient's wound is actually showing signs of good improvement this is definitely measuring smaller. I do not see any signs of infection which is great news and overall very pleased in that regard. Overall I think that she is doing well with the Rotha Cassels, Hudson ([id number]) [place]. 07/30/2020 upon evaluation today patient appears to be doing well in regard to her wound. She has been tolerating the dressing changes without complication think the Hydrofera Blue at this point is actually causing her to dry out a lot as far as the wound bed is concerned. Subsequently I think that we need to try to see what we can do to improve this. Fortunately there does not appear to be any evidence of active infection at this time which is great news. 08/06/2021 upon evaluation today patient appears to be doing well with regards to her wound. Is not measuring significantly smaller initial inspection but upon closer inspection she has a lot of new skin growth across the central portion of the wound. I think will  get very close to complete resolution. 08/13/2021 upon evaluation today patient appears to be doing well with regard to her wound. In fact this appears to be I believe healed although there is still some question as to whether it is completely so. I am concerned about the fact that to be honest she still has some evidence of dry skin that could be just trapping something underneath I still want to debride anything as I am afraid of causing some damage to the good skin for that reason I Georgina Peer probably monitor for 1 more week before completely closing everything out. 08/20/2021 upon evaluation today patient actually appears to be doing excellent. In fact she appears to be completely healed. This was the case last week as well we just wanted to make sure nothing reopened since she does not really have an ability to put on compression socks this is good to be something we need to make sure was well-healed. Nonetheless I think we have achieved that goal as of today. 12/27; this is a patient we discharged 2 weeks ago with wounds on her left lower leg laterally. She was supposed to get stockings and really did not get them. She developed blistering and reopening probably sometime late last week. She has 3 areas in the same area as previously. The mid calf dimensions on the left went from 39 to 51 cm today. She also has very significant edema on the right leg but as far as I am aware has not had wounds in this area 1/4; patient presents for follow-up. She has tolerated the compression wrap well. She has no issues or complaints today. 09/17/2021 upon evaluation today patient appears to be doing well with regard to her wounds. She has been tolerating the dressing changes without complication. Fortunately there does not appear to be any signs of active infection at this time. No fevers, chills, nausea, vomiting, or diarrhea. 10/01/2021 upon evaluation today patient appears to be doing well with regard to her  wound this is actually measuring better and looking smaller and I am very pleased in that regard. Fortunately I do not see any evidence of active infection locally nor systemically at this point. No fevers, chills, nausea, vomiting, or diarrhea. 10/08/2021 upon evaluation patient's wounds are actually showing some signs of improvement measuring a little bit smaller and looking a little bit better. Overall I do not think there is any need for sharp debridement today which  is good news. No fevers, chills, nausea, vomiting, or diarrhea. 10/14/2021 upon evaluation today patient appears to be doing well with regard to her wound. She has been tolerating the dressing changes without complication and overall I am extremely pleased with where things stand today. There is a little bit of film on the surface of the wounds but this was carefully cleaned away with just saline and gauze she really did not want me to do any sharp debridement today. 10/29/2021 upon evaluation today patient appears to be doing quite well in regard to her wounds. She is actually showing signs of excellent improvement which is great news and overall I feel like we are on the right track at this time. She does have a wound on both legs and she does need something compression wise when she heals for that reason we will get a go and see about ordering her bilateral juxta lite compression wraps. 11/05/2021 upon evaluation today patient appears to be doing well with regard to her wound. She has been tolerating the dressing changes without complication. Fortunately I do not see any evidence of active infection locally nor systemically at this time which is great news. No fevers, chills, nausea, vomiting, or diarrhea. 11/12/2021 upon evaluation today patient appears to be doing well with regard to her wounds. She is doing excellent on the left on the right this is not doing quite as well there is some slough and biofilm buildup. 3/14; patient  presents for follow-up. She has no issues or complaints today. She has tolerated the compression wraps well. 11/26/2021 upon evaluation today patient appears to be doing well with regard to her wounds. Both are showing signs of good improvement which is great news. I do not see any evidence of active infection and overall think she is healing quite nicely. 12/03/2021 upon evaluation today patient appears to be doing well with regard to her wounds. Both are showing signs of excellent improvement I am very pleased with where things stand and I think that she is making good progress here. Fortunately I do not see any signs of active infection locally or systemically which is great news. No fevers, chills, nausea, vomiting, or diarrhea. 12-10-2021 upon evaluation today patient's wounds actually appear to be doing awesome. I am extremely pleased with where we stand I think she is making excellent progress. I do not see any signs of active infection locally or systemically at this time. 12-17-2021 upon evaluation today patient's wounds are actually showing signs of good improvement. Fortunately I do not see any evidence of active infection locally nor systemically which is great news. Overall I do not believe that she is showing any signs of significant worsening which is good news. With that being said I do believe that she has a little bit of film on the surface of the wound I was actually able to clear this away with saline and gauze no sharp debridement was necessary. 12-24-2021 upon evaluation today patient appears to be doing well with regard to her wounds. I do not see any signs of active infection currently which is great news and overall I think that we are on the right track here. No fevers, chills, nausea, vomiting, or diarrhea. 12-31-2021 upon evaluation today patient appears to be doing well with regard to her wounds. Both are showing signs of improvement the wound on the right leg is going require  some sharp debridement on the left seems to be doing quite well. 01-07-2022 upon evaluation today patient  appears to be doing well currently in regard to her wounds. She is actually making good progress bilaterally. Both of them require little bit of sharp debridement but not too much which is great news. [place], [name] ([id number]) Upon inspection patient's wound bed actually showed signs of good granulation and epithelization on both legs. She still is having significant issues with the right leg compared to the left although I do feel like both are actually showing signs of pretty good improvement. 01-21-2022 upon evaluation today patient's wound is actually showing signs of excellent improvement I am very pleased with where we stand currently. I do not see any signs of active infection locally or systemically which is great news. No fevers, chills, nausea, vomiting, or diarrhea. 01-28-2022 upon evaluation today patient appears to be doing well with regard to her wound on the left leg this is pretty much just about healed which is great news. Unfortunately in regard to the wound on her right leg this is not doing nearly as well in fact I think we probably need to see about a culture today that was discussed with her. 02-04-2022 upon evaluation today patient appears to be doing well with regard to her left leg which in fact might actually be completely healed I am not 100% sure. Nonetheless this is shown signs of excellent improvement which is great news. With that being said in regards to the right leg there is some slough and film buildup on the surface of the wound although she is feeling much better than last week this does appear to be doing much better than it was last week. Fortunately there does not appear to be any signs of active infection locally or systemically at this time. 02-11-2022 upon evaluation today patient's wounds appear to be doing decently well. In fact the left leg is healed  although there is brand-new skin were definitely given still need to wrap her for the time being. On the right leg this is showing signs of some epithelial growth in the middle of the wound it still measures the same because there is still a larger area with speckled openings but again as far as the entire area being open this is significantly improved which is great news. I am very pleased. 6/13; left leg remains healed and she has a juxta light to apply to it today. There is some concern about whether she is going to be able to do this at home. She lives with a daughter although she has her own disability. On the right the wound looks smaller to me nice epithelialization good edema control. 02-25-2022 upon evaluation today patient appears to be doing well with regard to her legs the left leg is still healed the right leg is doing much better. I think we are on the right track here. With that being said unfortunately she continues to have significant issues with some other problems and she actually tells me on Saturday she was having lunch and went inside to sit down when she tells me she had all of a sudden a sharp sensation that went from her hand through her arm into her leg and her neck and she tells me that she could not see anything for the next hour it was completely black. Slowly after this her vision started to return and has been normal since. It sounds to me as if she may have had a TIA or mini stroke at this point. Nonetheless I think that she needs to have this  checked out ASAP. Her primary care provider is Dr. Clide Deutscher at [facility] We will get a try to get in touch with him. 03-10-2022 upon evaluation today patient's wound actually showed signs of good improvement. She seems to be making excellent progress here. I do not see any evidence of active infection locally or systemically which is great news. No fevers, chills, nausea, vomiting, or diarrhea. She was in the hospital since have  seen her last she ended up finding out that she was having syncopal episodes. That is what was going on with the blacking out and losing vision. Subsequently they adjusted some of her medication she seems to be doing better. 03-18-2022 upon evaluation today patient's wound is actually showing signs of good granulation and epithelization at this point. Fortunately I do not see any signs of active infection locally or systemically which is great news. 03-25-2022 any evidence of active infection locally or systemically which is great news. Any evidence of active infection which is great news and overall the wound is looking better. Upon evaluation today patient appears to be doing well with regard to her leg ulcer. She has been tolerating the dressing changes without complication. Fortunately I do not see 7/25; the patient's wound is on the right lateral lower extremity using silver alginate 3 layer compression. She has chronic venous insufficiency and lymphedema. We are using a juxta lite on the left leg which does not have any open wounds but she is leaving this on all week it seems that there just is not a way to get this changed at home 04-08-2022 upon evaluation today patient appears to be doing well currently in regard to her wound. She has been tolerating the dressing changes without complication. Fortunately I do not see any evidence of active infection locally or systemically which is great news. No fever or chills noted her left leg has reopened at this point. 04-15-2022 upon evaluation patient's wounds actually are showing signs of improvement which is great news. Little by little I do believe her making progress here. In regard to the right leg this is looking better though the measurement does not speak to it there is a lot of new skin growth. On the left leg this is significantly smaller compared to last week most of the blistered area has completely closed. 04-22-2022 upon evaluation today  patient appears to be doing excellent in regard to her wounds both are showing signs of improvement. Her knees and above are still extremely swollen the legs were rewrapped them are down significantly. With that being said I do not think that we will get a be able to keep them that way once were not rapid noted that she really has no way to be able to put juxta lites on and off and nobody to help her with that. That is the biggest concern that we have here at this point. Fortunately I do not think that there is any evidence of infection which is great news. Electronic Signature(s) Signed: 04/22/2022 10:34:44 AM By: Worthy Keeler PA-C Entered By: Worthy Keeler on 04/22/2022 10:34:44 [name] ([id number]) -------------------------------------------------------------------------------- Physical Exam Details Patient Name: [name] Date of Service: 04/22/2022 10:15 AM Medical Record Number: [id number] Patient Account Number: [id number] Date of Birth/Sex: 1946-04-18 (76 y.o. F) Treating RN: Carlene Coria Primary Care Provider: [name] Other Clinician: Referring Provider: [name] Treating Provider/Extender: Skipper Cliche in Treatment: 15 Constitutional Obese and well-hydrated in no acute distress. Respiratory normal breathing without difficulty. Psychiatric  this patient is able to make decisions and demonstrates good insight into disease process. Alert and Oriented x 3. pleasant and cooperative. Notes Upon inspection patient's wound bed actually showed signs of good granulation and epithelization at this point. Fortunately I do not see any signs of infection locally or systemically which is great news and overall I am extremely pleased with where things stand currently. Electronic Signature(s) Signed: 04/22/2022 10:35:20 AM By: Worthy Keeler PA-C Entered By: Worthy Keeler on 04/22/2022 10:35:20 [name]  ([id number]) -------------------------------------------------------------------------------- Physician Orders Details Patient Name: [name] Date of Service: 04/22/2022 10:15 AM Medical Record Number: [id number] Patient Account Number: [id number] Date of Birth/Sex: March 25, 1946 (76 y.o. F) Treating RN: Carlene Coria Primary Care Provider: [name] Other Clinician: Referring Provider: [name] Treating Provider/Extender: Skipper Cliche in Treatment: 51 Verbal / Phone Orders: No Diagnosis Coding ICD-10 Coding Code Description I89.0 Lymphedema, not elsewhere classified I87.332 Chronic venous hypertension (idiopathic) with ulcer and inflammation of left lower extremity L97.812 Non-pressure chronic ulcer of other part of right lower leg with fat layer exposed Follow-up Appointments o Return Appointment in 1 week. o Nurse Visit as needed Bathing/ Shower/ Hygiene o May shower with wound dressing protected with water repellent cover or cast protector. o No tub bath. Anesthetic (Use 'Patient Medications' Section for Anesthetic Order Entry) o Lidocaine applied to wound bed Edema Control - Lymphedema / Segmental Compressive Device / Other Bilateral Lower Extremities o Optional: One layer of unna paste to top of compression wrap (to act as an anchor). - she needs this o Patient to wear own Velcro compression garment. Remove compression stockings every night before going to bed and put on every morning when getting up. - left leg o Elevate, Exercise Daily and Avoid Standing for Long Periods of Time. o Elevate legs to the level of the heart and pump ankles as often as possible o Elevate leg(s) parallel to the floor when sitting. o Compression Pump: Use compression pump on left lower extremity for 60 minutes, twice daily. - You may pump over wraps o Compression Pump: Use compression pump on right lower extremity for 60 minutes, twice daily. - You may pump  over wraps Additional Orders / Instructions o Follow Nutritious Diet and Increase Protein Intake Wound Treatment Wound #4 - Lower Leg Wound Laterality: Right, Lateral Cleanser: Soap and Water 1 x Per Week/30 Days Discharge Instructions: Gently cleanse wound with antibacterial soap, rinse and pat dry prior to dressing wounds Cleanser: Wound Cleanser 1 x Per Week/30 Days Discharge Instructions: Wash your hands with soap and water. Remove old dressing, discard into plastic bag and place into trash. Cleanse the wound with Wound Cleanser prior to applying a clean dressing using gauze sponges, not tissues or cotton balls. Do not scrub or use excessive force. Pat dry using gauze sponges, not tissue or cotton balls. Primary Dressing: Silvercel Small 2x2 (in/in) 1 x Per Week/30 Days Discharge Instructions: Apply Silvercel Small 2x2 (in/in) as instructed Secondary Dressing: ABD Pad 5x9 (in/in) 1 x Per Week/30 Days Discharge Instructions: Cover with ABD pad Compression Wrap: 3-LAYER WRAP - Profore Lite LF 3 Multilayer Compression Bandaging System 1 x Per Week/30 Days Discharge Instructions: Apply 3 multi-layer wrap as prescribed. Compression Stockings: Circaid Juxta Lite Compression Wrap Right Leg Compression Amount: 20-30 mmHG Discharge Instructions: Apply Circaid Juxta Lite Compression Wrap as directed Wound #5 - Lower Leg Wound Laterality: Left [name], [name] ([id number]) Cleanser: Soap and Water 1 x Per Week/30 Days Discharge Instructions: Gently cleanse wound  with antibacterial soap, rinse and pat dry prior to dressing wounds Cleanser: Wound Cleanser 1 x Per Week/30 Days Discharge Instructions: Wash your hands with soap and water. Remove old dressing, discard into plastic bag and place into trash. Cleanse the wound with Wound Cleanser prior to applying a clean dressing using gauze sponges, not tissues or cotton balls. Do not scrub or use excessive force. Pat dry using gauze sponges, not  tissue or cotton balls. Primary Dressing: Xeroform 5x9-HBD (in/in) 1 x Per Week/30 Days Discharge Instructions: Apply Xeroform 5x9-HBD (in/in) as directed Secondary Dressing: ABD Pad 5x9 (in/in) 1 x Per Week/30 Days Discharge Instructions: Cover with ABD pad Compression Wrap: 3-LAYER WRAP - Profore Lite LF 3 Multilayer Compression Bandaging System 1 x Per Week/30 Days Discharge Instructions: Apply 3 multi-layer wrap as prescribed. Electronic Signature(s) Signed: 04/24/2022 5:21:19 PM By: Worthy Keeler PA-C Signed: 04/25/2022 6:13:50 PM By: Carlene Coria RN Entered By: Carlene Coria on 04/22/2022 10:28:29 [name], [name] ([id number]) -------------------------------------------------------------------------------- Problem List Details Patient Name: [name] Date of Service: 04/22/2022 10:15 AM Medical Record Number: [id number] Patient Account Number: [id number] Date of Birth/Sex: Feb 17, 1946 (76 y.o. F) Treating RN: Carlene Coria Primary Care Provider: [name] Other Clinician: Referring Provider: [name] Treating Provider/Extender: Skipper Cliche in Treatment: 65 Active Problems ICD-10 Encounter Code Description Active Date MDM Diagnosis I89.0 Lymphedema, not elsewhere classified 01/16/2021 No Yes I87.332 Chronic venous hypertension (idiopathic) with ulcer and inflammation of 01/16/2021 No Yes left lower extremity L97.812 Non-pressure chronic ulcer of other part of right lower leg with fat layer 01/07/2022 No Yes exposed Inactive Problems ICD-10 Code Description Active Date Inactive Date L97.822 Non-pressure chronic ulcer of other part of left lower leg with fat layer exposed 01/16/2021 01/16/2021 Resolved Problems Electronic Signature(s) Signed: 04/22/2022 10:11:35 AM By: Worthy Keeler PA-C Entered By: Worthy Keeler on 04/22/2022 10:11:34 [name] ([id number]) -------------------------------------------------------------------------------- Progress Note  Details Patient Name: [name] Date of Service: 04/22/2022 10:15 AM Medical Record Number: [id number] Patient Account Number: [id number] Date of Birth/Sex: Mar 22, 1946 (76 y.o. F) Treating RN: Carlene Coria Primary Care Provider: [name] Other Clinician: Referring Provider: [name] Treating Provider/Extender: Skipper Cliche in Treatment: 38 Subjective Chief Complaint Information obtained from Patient Bilateral LE Ulcers History of Present Illness (HPI) 03/15/2020 upon evaluation today patient presents today for initial evaluation here in our clinic concerning a wound that is on the left posterior lower extremity. Unfortunately this has been giving the patient some discomfort at this point which she notes has been affected her pretty much daily. The patient does have a history of venous insufficiency, hypertension, congestive heart failure, and a history of breast cancer. Fortunately there does not appear to be evidence of active infection at this time which is great news. No fevers, chills, nausea, vomiting, or diarrhea. Wound is not extremely large but does have some slough covering the surface of the wound. This is going require sharp debridement today. 03/15/2020 upon evaluation today patient appears to be doing fairly well in regard to her wound. She does have some slough noted on the surface of the wound currently but I do believe the Iodoflex has been beneficial over the past week. There is no signs of active infection at this time which is great news and overall very pleased with the progress. No fevers, chills, nausea, vomiting, or diarrhea. 04/02/2020 upon evaluation today patient appears to be doing a little better in regard to her wound size wise this is not tremendously smaller she does have  some slough buildup on the surface of the wound. With that being said this is going require some sharp debridement today to clear away some of the necrotic debris. Fortunately  there is no evidence of active infection at this time. No fevers, chills, nausea, vomiting, or diarrhea. 04/10/2020 on evaluation today patient appears to be doing well with regard to her ulcer which is measuring somewhat smaller today. She actually has more granulation tissue noted which is also good news is no need for sharp debridement today. Fortunately there is no evidence of infection either which is also excellent. No fevers, chills, nausea, vomiting, or diarrhea. 04/26/2020 on evaluation today patient's wound actually is showing signs of good improvement which is great news. There does not appear to be any evidence of active infection. I do believe she is tolerating the collagen at this point. 05/03/2020 upon evaluation today patient's wound actually showed signs of good granulation at this time there does not appear to be any evidence of active infection which is great news and overall very pleased with where things stand. With that being said I do believe that the patient is can require some sharp debridement today but fortunately nothing too significant. 05/11/20 on evaluation today patient appears to be doing well in regard to her leg ulcer. She's been tolerating the dressing changes without complication. Fortunately there is no signs of active infection at this time. Overall I'm very pleased with where things stand. 05/18/2020 upon evaluation today patient's wound is showing signs of improvement is very dry and I think the collagen for the most part just dried to the wound bed. I am going to clean this away with sharp debridement in order to get this under control in my opinion. 05/25/2020 upon evaluation today patient actually appears to be doing well in regard to her leg ulcer. In fact upon inspection it appears she could potentially be completely healed but that is not guaranteed based on what we are seeing. There does not appear to be any signs of active infection which is great  news. 05/31/2020 on evaluation today patient appears to be doing well with regard to her wounds. She in fact appears to be almost completely healed on the left leg there is just a very small opening remaining point 1 06/08/2020 upon evaluation today patient appears to be doing excellent in regard to her leg ulcer on the left in fact it appears to be that she is completely healed as of today. I see no signs of active infection at this time which is great news. No fevers, chills, nausea, vomiting, or diarrhea. READMISSION 01/16/2021 Patient that we have had in this clinic with a wound on the left posterior calf in the summer 2021 extending into October. She was felt to have chronic venous insufficiency. Per the patient's description she was discharged with what sounds like external compression stockings although she could not afford the "$75 per leg". She therefore did not get anything. Apparently her wound that she has currently is been in existence since January. She has been followed at vein and vascular with Unna boots and I think calcium alginate. She had ABIs done on 06/25/2020 that showed an noncompressible ABI on the right leg and the left leg but triphasic waveforms with good great toe pressures and a TBI of 0.90 on the right and 0.91 on the left Surprisingly the patient also had venous reflux studies that were really unremarkable. This included no evidence of DVTs or SVTs but there was  no evidence of deep venous insufficiency or superficial venous insufficiency in the greater saphenous or short saphenous veins bilaterally. I would wonder about more central venous issues or perhaps this is all lymphedema 01/25/2021 upon evaluation today patient appears to be doing decently well and guard to her wound. The good news is the Iodoflex that is job she saw Dr. Dellia Nims last week for readmission and to be honest I think that this has done extremely well over that week. I do believe that she can tolerate  the sharp debridement today which will be good. I think that cleaning off the wound will likely allow [place] to be able to use a different type of dressing I do not think the Iodoflex will even be necessary based on how clean the wound looks. Fortunately there is no sign of active infection at this time which is great news. No fevers, chills, nausea, vomiting, or diarrhea. [name], [name] ([id number]) 02/12/2021 upon evaluation today patient appears to be doing well with regard to her leg ulcer. We did switch to alginate when she came for nurse visit I think is doing much better for her. Fortunately there does not appear to be any signs of active infection at this time which is great news. No fevers, chills, nausea, vomiting, or diarrhea. 02/18/2021 upon evaluation today patient's wound is actually showing signs of good improvement. I am very pleased with where things stand currently. I do not see any signs of active infection which is great news and overall I feel like she is making good progress. The patient likewise is happy that things are doing so well and that this is measuring somewhat smaller. 02/25/2021 upon evaluation today patient actually appears to be doing quite well in regard to her wounds. She has been tolerating the dressing changes without complication. Fortunately there does not appear to be any signs of active infection which is great news and overall I am extremely pleased with where things stand. No fevers, chills, nausea, vomiting, or diarrhea. She does have a lot of edema I really do think she needs compression socks to be worn in order to prevent things from causing additional issues for her currently. Especially on the right leg she should be wearing compression socks all the time to be honest. 03/04/2021 upon evaluation today patient's leg ulcer actually appears to be doing pretty well which is great news. There does not appear to be any significant change but overall the  appearance is better even though the size is not necessarily reflecting a great improvement. Fortunately there does not appear to be any signs of active infection. No fevers, chills, nausea, vomiting, or diarrhea. 03/12/2021 upon evaluation today patient appears to be doing well with regard to her wounds. She has been tolerating the dressing changes without complication. The good news is that the wound on her left lateral leg is doing much better and is showing signs of good improvement. Overall her swelling is controlled well as well. She does need some compression socks she plans to go Jefferey socks next week. 03/18/2021 on evaluation today patient's wound actually is showing signs of good improvement. She does have a little bit of slough this can require some sharp debridement today. 03/25/2021 upon evaluation today patient appears to be doing well with regard to her wound on the left lateral leg. She has been tolerating the dressing changes without complication. Fortunately there does not appear to be any signs of active infection at this time. No fever chills no 04/01/2021  upon evaluation today patient's wound is showing signs of improvement and overall very pleased with where things stand at this point. There is no evidence of active infection which is great news as well and in general I think that she is making great progress. 04/09/2021 upon evaluation today patient appears to be doing well with regard to her ankle ulcer. She is making good progress currently which is great news. There does not appear to be any signs of infection also excellent news. In general I am extremely pleased with where things stand today. No fevers, chills, nausea, vomiting, or diarrhea. 04/23/2021 upon inspection today patient appears to be doing quite well with regard to her leg ulcer. She has been tolerating the dressing changes without complication. She is can require some sharp debridement today but overall seems to be  doing excellent. 05/06/2021 upon evaluation today patient unfortunately appears to be doing poorly overall in regard to her swelling. She is actually significantly more swollen than last week despite the fact that her wound is doing better this is not a good trend. I think that she probably needs to see her cardiologist ASAP and I discussed that with her today. This includes the fact that honestly I think she probably is getting need an increase in her diuretic or something to try to clear away some of the excess fluid that she is experiencing at the moment. She is not been doing her pumps even twice a day but again right now more concerned that if she were to do the pump she would fluid overload her lungs causing other issues as far as her breathing is concerned. Obviously I think she needs to contact them and move up the appointment for the 12th of something sooner 2 weeks is a bit too long to wait with how swollen she has. 05/14/2021 upon evaluation today patient's wound actually showing signs of being a little bit smaller compared to previous. She has been making good progress however. Fortunately there does not appear to be any evidence of active infection which is great. Overall I am extremely pleased in that regard. Nonetheless I am still concerned that she is having quite a bit of issue as far as the swelling is concerned she has been placed on fluid restriction as well as an increase in the furosemide by her doctor. We will see how things progress. 05/21/2021 upon evaluation today patient appears to be doing well with regard to her wound in general. She has been tolerating the dressing changes without complication. With that being said she has been using silver cell for a while and while this was showing signs of improvement it is now gotten to the point where this has slowed down quite significantly. I do believe that switching to a different dressing may be beneficial for her today. 05/28/2021  upon evaluation today patient actually appears to be doing quite well in regard to her wound. In fact there is really not any need for sharp debridement today based on what I see. The Hydrofera Blue is doing great and overall I think that she is managing quite nicely with the compression wrapping. Her edema is still down quite a bit with the wrapping in place. 06/11/2021 upon evaluation today patient appears to be doing well with regard to her leg ulcer. She has been tolerating the dressing changes without complication. Fortunately there does not appear to be any signs of active infection at this time which is great news. No fevers, chills, nausea, vomiting,  or diarrhea. She is going require some sharp debridement today. 10/11; left leg ulcer much better looking and with improved surface area. She is using Hydrofera Blue under 3 layer compression. She does not have any stocking on the right leg which in itself has very significant nonpitting edema 06/25/2021 upon evaluation today patient appears to be doing well with regard to the wound on her left leg. She has been tolerating the dressing changes without complication and overall I am extremely pleased with where things stand today. I do not see any evidence of infection which is great news. 07/02/2021 upon evaluation today patient's wound actually showing signs of excellent improvement and actually very pleased with where we stand today and the overall appearance. Fortunately there does not appear to be any signs of active infection. No fevers, chills, nausea, vomiting, or diarrhea. 07/09/2021 upon evaluation today patient appears to be doing better in regard to her wound this is measuring smaller and she is headed in the appropriate direction based on what I am seeing currently. I do not see any evidence of active infection systemically which is great news. 07/16/2021 upon evaluation today patient appears to be doing pretty well currently in regard to  her leg ulcer. This is measuring smaller and looking much better this is great news. Fortunately there does not appear to be any evidence of active infection systemically which is great news BRANAE, CRAIL ([id number]) as well. 07/23/2021 upon evaluation today patient's wound is actually showing signs of good improvement this is definitely measuring smaller. I do not see any signs of infection which is great news and overall very pleased in that regard. Overall I think that she is doing well with the [name]. 07/30/2020 upon evaluation today patient appears to be doing well in regard to her wound. She has been tolerating the dressing changes without complication think the Hydrofera Blue at this point is actually causing her to dry out a lot as far as the wound bed is concerned. Subsequently I think that we need to try to see what we can do to improve this. Fortunately there does not appear to be any evidence of active infection at this time which is great news. 08/06/2021 upon evaluation today patient appears to be doing well with regards to her wound. Is not measuring significantly smaller initial inspection but upon closer inspection she has a lot of new skin growth across the central portion of the wound. I think will get very close to complete resolution. 08/13/2021 upon evaluation today patient appears to be doing well with regard to her wound. In fact this appears to be I believe healed although there is still some question as to whether it is completely so. I am concerned about the fact that to be honest she still has some evidence of dry skin that could be just trapping something underneath I still want to debride anything as I am afraid of causing some damage to the good skin for that reason I Georgina Peer probably monitor for 1 more week before completely closing everything out. 08/20/2021 upon evaluation today patient actually appears to be doing excellent. In fact she appears to be  completely healed. This was the case last week as well we just wanted to make sure nothing reopened since she does not really have an ability to put on compression socks this is good to be something we need to make sure was well-healed. Nonetheless I think we have achieved that goal as of today. 12/27; this is  a patient we discharged 2 weeks ago with wounds on her left lower leg laterally. She was supposed to get stockings and really did not get them. She developed blistering and reopening probably sometime late last week. She has 3 areas in the same area as previously. The mid calf dimensions on the left went from 39 to 51 cm today. She also has very significant edema on the right leg but as far as I am aware has not had wounds in this area 1/4; patient presents for follow-up. She has tolerated the compression wrap well. She has no issues or complaints today. 09/17/2021 upon evaluation today patient appears to be doing well with regard to her wounds. She has been tolerating the dressing changes without complication. Fortunately there does not appear to be any signs of active infection at this time. No fevers, chills, nausea, vomiting, or diarrhea. 10/01/2021 upon evaluation today patient appears to be doing well with regard to her wound this is actually measuring better and looking smaller and I am very pleased in that regard. Fortunately I do not see any evidence of active infection locally nor systemically at this point. No fevers, chills, nausea, vomiting, or diarrhea. 10/08/2021 upon evaluation patient's wounds are actually showing some signs of improvement measuring a little bit smaller and looking a little bit better. Overall I do not think there is any need for sharp debridement today which is good news. No fevers, chills, nausea, vomiting, or diarrhea. 10/14/2021 upon evaluation today patient appears to be doing well with regard to her wound. She has been tolerating the dressing changes  without complication and overall I am extremely pleased with where things stand today. There is a little bit of film on the surface of the wounds but this was carefully cleaned away with just saline and gauze she really did not want me to do any sharp debridement today. 10/29/2021 upon evaluation today patient appears to be doing quite well in regard to her wounds. She is actually showing signs of excellent improvement which is great news and overall I feel like we are on the right track at this time. She does have a wound on both legs and she does need something compression wise when she heals for that reason we will get a go and see about ordering her bilateral juxta lite compression wraps. 11/05/2021 upon evaluation today patient appears to be doing well with regard to her wound. She has been tolerating the dressing changes without complication. Fortunately I do not see any evidence of active infection locally nor systemically at this time which is great news. No fevers, chills, nausea, vomiting, or diarrhea. 11/12/2021 upon evaluation today patient appears to be doing well with regard to her wounds. She is doing excellent on the left on the right this is not doing quite as well there is some slough and biofilm buildup. 3/14; patient presents for follow-up. She has no issues or complaints today. She has tolerated the compression wraps well. 11/26/2021 upon evaluation today patient appears to be doing well with regard to her wounds. Both are showing signs of good improvement which is great news. I do not see any evidence of active infection and overall think she is healing quite nicely. 12/03/2021 upon evaluation today patient appears to be doing well with regard to her wounds. Both are showing signs of excellent improvement I am very pleased with where things stand and I think that she is making good progress here. Fortunately I do not see any signs  of active infection locally or systemically which is  great news. No fevers, chills, nausea, vomiting, or diarrhea. 12-10-2021 upon evaluation today patient's wounds actually appear to be doing awesome. I am extremely pleased with where we stand I think she is making excellent progress. I do not see any signs of active infection locally or systemically at this time. 12-17-2021 upon evaluation today patient's wounds are actually showing signs of good improvement. Fortunately I do not see any evidence of active infection locally nor systemically which is great news. Overall I do not believe that she is showing any signs of significant worsening which is good news. With that being said I do believe that she has a little bit of film on the surface of the wound I was actually able to clear this away with saline and gauze no sharp debridement was necessary. 12-24-2021 upon evaluation today patient appears to be doing well with regard to her wounds. I do not see any signs of active infection currently which is great news and overall I think that we are on the right track here. No fevers, chills, nausea, vomiting, or diarrhea. 12-31-2021 upon evaluation today patient appears to be doing well with regard to her wounds. Both are showing signs of improvement the wound [name], [name] ([id number]) on the right leg is going require some sharp debridement on the left seems to be doing quite well. 01-07-2022 upon evaluation today patient appears to be doing well currently in regard to her wounds. She is actually making good progress bilaterally. Both of them require little bit of sharp debridement but not too much which is great news. Upon inspection patient's wound bed actually showed signs of good granulation and epithelization on both legs. She still is having significant issues with the right leg compared to the left although I do feel like both are actually showing signs of pretty good improvement. 01-21-2022 upon evaluation today patient's wound is actually showing  signs of excellent improvement I am very pleased with where we stand currently. I do not see any signs of active infection locally or systemically which is great news. No fevers, chills, nausea, vomiting, or diarrhea. 01-28-2022 upon evaluation today patient appears to be doing well with regard to her wound on the left leg this is pretty much just about healed which is great news. Unfortunately in regard to the wound on her right leg this is not doing nearly as well in fact I think we probably need to see about a culture today that was discussed with her. 02-04-2022 upon evaluation today patient appears to be doing well with regard to her left leg which in fact might actually be completely healed I am not 100% sure. Nonetheless this is shown signs of excellent improvement which is great news. With that being said in regards to the right leg there is some slough and film buildup on the surface of the wound although she is feeling much better than last week this does appear to be doing much better than it was last week. Fortunately there does not appear to be any signs of active infection locally or systemically at this time. 02-11-2022 upon evaluation today patient's wounds appear to be doing decently well. In fact the left leg is healed although there is brand-new skin were definitely given still need to wrap her for the time being. On the right leg this is showing signs of some epithelial growth in the middle of the wound it still measures the same because there is  still a larger area with speckled openings but again as far as the entire area being open this is significantly improved which is great news. I am very pleased. 6/13; left leg remains healed and she has a juxta light to apply to it today. There is some concern about whether she is going to be able to do this at home. She lives with a daughter although she has her own disability. On the right the wound looks smaller to me nice epithelialization  good edema control. 02-25-2022 upon evaluation today patient appears to be doing well with regard to her legs the left leg is still healed the right leg is doing much better. I think we are on the right track here. With that being said unfortunately she continues to have significant issues with some other problems and she actually tells me on Saturday she was having lunch and went inside to sit down when she tells me she had all of a sudden a sharp sensation that went from her hand through her arm into her leg and her neck and she tells me that she could not see anything for the next hour it was completely black. Slowly after this her vision started to return and has been normal since. It sounds to me as if she may have had a TIA or mini stroke at this point. Nonetheless I think that she needs to have this checked out ASAP. Her primary care provider is Dr. Clide Deutscher at [facility]. We will get a try to get in touch with him. 03-10-2022 upon evaluation today patient's wound actually showed signs of good improvement. She seems to be making excellent progress here. I do not see any evidence of active infection locally or systemically which is great news. No fevers, chills, nausea, vomiting, or diarrhea. She was in the hospital since have seen her last she ended up finding out that she was having syncopal episodes. That is what was going on with the blacking out and losing vision. Subsequently they adjusted some of her medication she seems to be doing better. 03-18-2022 upon evaluation today patient's wound is actually showing signs of good granulation and epithelization at this point. Fortunately I do not see any signs of active infection locally or systemically which is great news. 03-25-2022 any evidence of active infection locally or systemically which is great news. Any evidence of active infection which is great news and overall the wound is looking better. Upon evaluation today patient appears to be  doing well with regard to her leg ulcer. She has been tolerating the dressing changes without complication. Fortunately I do not see 7/25; the patient's wound is on the right lateral lower extremity using silver alginate 3 layer compression. She has chronic venous insufficiency and lymphedema. We are using a juxta lite on the left leg which does not have any open wounds but she is leaving this on all week it seems that there just is not a way to get this changed at home 04-08-2022 upon evaluation today patient appears to be doing well currently in regard to her wound. She has been tolerating the dressing changes without complication. Fortunately I do not see any evidence of active infection locally or systemically which is great news. No fever or chills noted her left leg has reopened at this point. 04-15-2022 upon evaluation patient's wounds actually are showing signs of improvement which is great news. Little by little I do believe her making progress here. In regard to the right leg  this is looking better though the measurement does not speak to it there is a lot of new skin growth. On the left leg this is significantly smaller compared to last week most of the blistered area has completely closed. 04-22-2022 upon evaluation today patient appears to be doing excellent in regard to her wounds both are showing signs of improvement. Her knees and above are still extremely swollen the legs were rewrapped them are down significantly. With that being said I do not think that we will get a be able to keep them that way once were not rapid noted that she really has no way to be able to put juxta lites on and off and nobody to help her with that. That is the biggest concern that we have here at this point. Fortunately I do not think that there is any evidence of infection which is great news. Objective Constitutional [name], [name] ([id number]) Obese and well-hydrated in no acute distress. Vitals Time  Taken: 10:08 AM, Height: 69 in, Weight: 244 lbs, BMI: 36, Temperature: 97.8 F, Pulse: 78 bpm, Respiratory Rate: 18 breaths/min, Blood Pressure: 179/80 mmHg. Respiratory normal breathing without difficulty. Psychiatric this patient is able to make decisions and demonstrates good insight into disease process. Alert and Oriented x 3. pleasant and cooperative. General Notes: Upon inspection patient's wound bed actually showed signs of good granulation and epithelization at this point. Fortunately I do not see any signs of infection locally or systemically which is great news and overall I am extremely pleased with where things stand currently. Integumentary (Hair, Skin) Wound #4 status is Open. Original cause of wound was Gradually Appeared. The date acquired was: 10/27/2021. The wound has been in treatment 25 weeks. The wound is located on the Right,Lateral Lower Leg. The wound measures 5cm length x 4.5cm width x 0.1cm depth; 17.671cm^2 area and 1.767cm^3 volume. There is Fat Layer (Subcutaneous Tissue) exposed. There is no tunneling or undermining noted. There is a medium amount of serosanguineous drainage noted. There is medium (34-66%) pink granulation within the wound bed. There is a medium (34-66%) amount of necrotic tissue within the wound bed including Adherent Slough. Wound #5 status is Open. Original cause of wound was Blister. The date acquired was: 04/04/2022. The wound has been in treatment 2 weeks. The wound is located on the Left Lower Leg. The wound measures 1cm length x 1cm width x 0.1cm depth; 0.785cm^2 area and 0.079cm^3 volume. There is Fat Layer (Subcutaneous Tissue) exposed. There is no tunneling or undermining noted. There is a medium amount of serosanguineous drainage noted. There is medium (34-66%) red, pink granulation within the wound bed. There is a medium (34-66%) amount of necrotic tissue within the wound bed. 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 Nurse Visit as needed Bathing/ Shower/ Hygiene: May shower with wound dressing protected with water repellent cover or cast protector. No tub bath. Anesthetic (Use 'Patient Medications' Section for Anesthetic Order Entry): Lidocaine applied to wound bed Edema Control - Lymphedema / Segmental Compressive Device / Other: Optional: One layer of unna paste to top of compression wrap (to act as an anchor). - she needs this Patient to wear own Velcro compression garment. Remove compression stockings every night before going to bed and put on every morning when getting up. - left  leg Elevate, Exercise Daily and Avoid Standing for Long Periods of Time. Elevate legs to the level of the heart and pump ankles as often as possible Elevate leg(s) parallel to the floor when sitting. Compression Pump: Use compression pump on left lower extremity for 60 minutes, twice daily. - You may pump over wraps Compression Pump: Use compression pump on right lower extremity for 60 minutes, twice daily. - You may pump over wraps Additional Orders / Instructions: Follow Nutritious Diet and Increase Protein Intake WOUND #4: - Lower Leg Wound Laterality: Right, Lateral Cleanser: Soap and Water 1 x Per Week/30 Days Discharge Instructions: Gently cleanse wound with antibacterial soap, rinse and pat dry prior to dressing wounds Cleanser: Wound Cleanser 1 x Per Week/30 Days Discharge Instructions: Wash your hands with soap and water. Remove old dressing, discard into plastic bag and place into trash. Cleanse the wound with Wound Cleanser prior to applying a clean dressing using gauze sponges, not tissues or cotton balls. Do not scrub or use excessive force. Pat dry using gauze  sponges, not tissue or cotton balls. Primary Dressing: Silvercel Small 2x2 (in/in) 1 x Per Week/30 Days Discharge Instructions: Apply Silvercel Small 2x2 (in/in) as instructed Secondary Dressing: ABD Pad 5x9 (in/in) 1 x Per Week/30 Days [name], [name] ([id number]) Discharge Instructions: Cover with ABD pad Compression Wrap: 3-LAYER WRAP - Profore Lite LF 3 Multilayer Compression Bandaging System 1 x Per Week/30 Days Discharge Instructions: Apply 3 multi-layer wrap as prescribed. Compression Stockings: Circaid Juxta Lite Compression Wrap Compression Amount: 20-30 mmHg (right) Discharge Instructions: Apply Circaid Juxta Lite Compression Wrap as directed WOUND #5: - Lower Leg Wound Laterality: Left Cleanser: Soap and Water 1 x Per Week/30 Days Discharge Instructions: Gently cleanse wound with antibacterial soap, rinse and pat dry prior to dressing wounds Cleanser: Wound Cleanser 1 x Per Week/30 Days Discharge Instructions: Wash your hands with soap and water. Remove old dressing, discard into plastic bag and place into trash. Cleanse the wound with Wound Cleanser prior to applying a clean dressing using gauze sponges, not tissues or cotton balls. Do not scrub or use excessive force. Pat dry using gauze sponges, not tissue or cotton balls. Primary Dressing: Xeroform 5x9-HBD (in/in) 1 x Per Week/30 Days Discharge Instructions: Apply Xeroform 5x9-HBD (in/in) as directed Secondary Dressing: ABD Pad 5x9 (in/in) 1 x Per Week/30 Days Discharge Instructions: Cover with ABD pad Compression Wrap: 3-LAYER WRAP - Profore Lite LF 3 Multilayer Compression Bandaging System 1 x Per Week/30 Days Discharge Instructions: Apply 3 multi-layer wrap as prescribed. 1. I am going to recommend that we go ahead and continue with the Xeroform gauze on the left and the silver alginate dressing on the right dressing along with the compression wrapping this seems to be doing an awesome job and I would recommend that we  continue as such. 2. Also can recommend that we continue with the elevation and use of lymphedema pump she tells me that she is doing this every day despite that she still having a tremendous amount of swelling noted at this point. 3. I am also can recommend that we have the patient continue to elevate her legs much as possible try to help with edema control. We will see patient back for reevaluation in 1 week here in the clinic. If anything worsens or changes patient will contact our office for additional recommendations. Electronic Signature(s) Signed: 04/22/2022 10:36:25 AM By: Worthy Keeler PA-C Entered By: Worthy Keeler on 04/22/2022 10:36:24 [name] ([id number]) -------------------------------------------------------------------------------- SuperBill Details  Patient Name: [name], [name] Date of Service: 04/22/2022 Medical Record Number: [id number] Patient Account Number: [id number] Date of Birth/Sex: 04/25/1946 (76 y.o. F) Treating RN: Carlene Coria Primary Care Provider: [name] Other Clinician: Referring Provider: [name] Treating Provider/Extender: Skipper Cliche in Treatment: 65 Diagnosis Coding ICD-10 Codes Code Description I89.0 Lymphedema, not elsewhere classified I87.332 Chronic venous hypertension (idiopathic) with ulcer and inflammation of left lower extremity L97.812 Non-pressure chronic ulcer of other part of right lower leg with fat layer exposed Facility Procedures CPT4: Description Modifier Quantity Code [id number] BILATERAL: Application of multi-layer venous compression system; leg (below knee), including 1 ankle and foot. Physician Procedures CPT4 Code Description: [id number] - WC PHYS LEVEL 3 - EST PT Modifier: Quantity: 1 CPT4 Code Description: ICD-10 Diagnosis Description I89.0 Lymphedema, not elsewhere classified I87.332 Chronic venous hypertension (idiopathic) with ulcer and inflammation of L97.812 Non-pressure chronic  ulcer of other part of right lower leg with fat  lay Modifier: left lower extremity er exposed Quantity: Electronic Signature(s) Signed: 04/22/2022 11:00:05 AM By: Carlene Coria RN Signed: 04/24/2022 5:21:19 PM By: Worthy Keeler PA-C Previous Signature: 04/22/2022 10:37:05 AM Version By: Worthy Keeler PA-C Entered By: Carlene Coria on 04/22/2022 11:00:05

## 2022-04-26 NOTE — Progress Notes (Signed)
CLYDENE, BURACK ([id number]) Visit Report for 04/22/2022 Arrival Information Details Patient Name: MARGARETMARY, PRISK Date of Service: 04/22/2022 10:15 AM Medical Record Number: [id number] Patient Account Number: [id number] Date of Birth/Sex: 03/28/1946 (76 y.o. F) Treating RN: Carlene Coria Primary Care Rykin Route: [name] Other Clinician: Referring Malakhai Beitler: [name] Treating Edyn Popoca/Extender: Skipper Cliche in Treatment: 68 Visit Information History Since Last Visit All ordered tests and consults were completed: No Patient Arrived: Wheel Chair Added or deleted any medications: No Arrival Time: 10:07 Any new allergies or adverse reactions: No Accompanied By: self Had a fall or experienced change in No Transfer Assistance: None activities of daily living that may affect Patient Identification Verified: Yes risk of falls: Secondary Verification Process Completed: Yes Signs or symptoms of abuse/neglect since last visito No Patient Requires Transmission-Based Precautions: No Hospitalized since last visit: No Patient Has Alerts: Yes Implantable device outside of the clinic excluding No Patient Alerts: NOT DIABETIC cellular tissue based products placed in the center since last visit: Has Dressing in Place as Prescribed: Yes Has Compression in Place as Prescribed: Yes Pain Present Now: No Electronic Signature(s) Signed: 04/25/2022 6:13:50 PM By: Carlene Coria RN Entered By: Carlene Coria on 04/22/2022 10:07:57 [name] ([id number]) -------------------------------------------------------------------------------- Clinic Level of Care Assessment Details Patient Name: [name] Date of Service: 04/22/2022 10:15 AM Medical Record Number: [id number] Patient Account Number: [id number] Date of Birth/Sex: Aug 27, 1946 (76 y.o. F) Treating RN: Carlene Coria Primary Care Sydnee Lamour: [name] Other Clinician: Referring Arisbel Maione: [name] Treating Anahid Eskelson/Extender:  Skipper Cliche in Treatment: 29 Clinic Level of Care Assessment Items TOOL 1 Quantity Score '[]'$  - Use when EandM and Procedure is performed on INITIAL visit 0 ASSESSMENTS - Nursing Assessment / Reassessment '[]'$  - General Physical Exam (combine w/ comprehensive assessment (listed just below) when performed on new 0 pt. evals) '[]'$  - 0 Comprehensive Assessment (HX, ROS, Risk Assessments, Wounds Hx, etc.) ASSESSMENTS - Wound and Skin Assessment / Reassessment '[]'$  - Dermatologic / Skin Assessment (not related to wound area) 0 ASSESSMENTS - Ostomy and/or Continence Assessment and Care '[]'$  - Incontinence Assessment and Management 0 '[]'$  - 0 Ostomy Care Assessment and Management (repouching, etc.) PROCESS - Coordination of Care '[]'$  - Simple Patient / Family Education for ongoing care 0 '[]'$  - 0 Complex (extensive) Patient / Family Education for ongoing care '[]'$  - 0 Staff obtains Programmer, systems, Records, Test Results / Process Orders '[]'$  - 0 Staff telephones HHA, Nursing Homes / Clarify orders / etc '[]'$  - 0 Routine Transfer to another Facility (non-emergent condition) '[]'$  - 0 Routine Hospital Admission (non-emergent condition) '[]'$  - 0 New Admissions / Biomedical engineer / Ordering NPWT, Apligraf, etc. '[]'$  - 0 Emergency Hospital Admission (emergent condition) PROCESS - Special Needs '[]'$  - Pediatric / Minor Patient Management 0 '[]'$  - 0 Isolation Patient Management '[]'$  - 0 Hearing / Language / Visual special needs '[]'$  - 0 Assessment of Community assistance (transportation, D/C planning, etc.) '[]'$  - 0 Additional assistance / Altered mentation '[]'$  - 0 Support Surface(s) Assessment (bed, cushion, seat, etc.) INTERVENTIONS - Miscellaneous '[]'$  - External ear exam 0 '[]'$  - 0 Patient Transfer (multiple staff / Civil Service fast streamer / Similar devices) '[]'$  - 0 Simple Staple / Suture removal (25 or less) '[]'$  - 0 Complex Staple / Suture removal (26 or more) '[]'$  - 0 Hypo/Hyperglycemic Management (do not check if billed  separately) '[]'$  - 0 Ankle / Brachial Index (ABI) - do not check if billed separately Has the patient been seen at the hospital within the  last three years: Yes Total Score: 0 Level Of Care: ____ [name] ([id number]) Electronic Signature(s) Signed: 04/25/2022 6:13:50 PM By: Carlene Coria RN Entered By: Carlene Coria on 04/22/2022 10:59:48 [name] ([id number]) -------------------------------------------------------------------------------- Compression Therapy Details Patient Name: [name] Date of Service: 04/22/2022 10:15 AM Medical Record Number: [id number] Patient Account Number: [id number] Date of Birth/Sex: 19-Feb-1946 (76 y.o. F) Treating RN: Carlene Coria Primary Care Lakelyn Straus: [name] Other Clinician: Referring Gaylene Moylan: [name] Treating Jaci Desanto/Extender: Skipper Cliche in Treatment: 65 Compression Therapy Performed for Wound Assessment: Wound #4 Right,Lateral Lower Leg Performed By: Clinician Carlene Coria, RN Compression Type: Three Layer Post Procedure Diagnosis Same as Pre-procedure Electronic Signature(s) Signed: 04/22/2022 10:59:28 AM By: Carlene Coria RN Entered By: Carlene Coria on 04/22/2022 10:59:28 [name] ([id number]) -------------------------------------------------------------------------------- Encounter Discharge Information Details Patient Name: [name] Date of Service: 04/22/2022 10:15 AM Medical Record Number: [id number] Patient Account Number: [id number] Date of Birth/Sex: 1945-10-09 (76 y.o. F) Treating RN: Carlene Coria Primary Care Abbey Veith: [name] Other Clinician: Referring Ralphael Southgate: [name] Treating Rossetta Kama/Extender: Skipper Cliche in Treatment: 61 Encounter Discharge Information Items Discharge Condition: Stable Ambulatory Status: Wheelchair Discharge Destination: Home Transportation: Private Auto Accompanied By: self Schedule Follow-up Appointment: Yes Clinical Summary of  Care: Electronic Signature(s) Signed: 04/22/2022 11:01:01 AM By: Carlene Coria RN Entered By: Carlene Coria on 04/22/2022 11:01:01 [name] ([id number]) -------------------------------------------------------------------------------- Lower Extremity Assessment Details Patient Name: [name] Date of Service: 04/22/2022 10:15 AM Medical Record Number: [id number] Patient Account Number: [id number] Date of Birth/Sex: [birth year] (76 y.o. F) Treating RN: Carlene Coria Primary Care Julieana Eshleman: [name] Other Clinician: Referring Valentino Saavedra: [name] Treating Iyesha Such/Extender: Skipper Cliche in Treatment: 65 Electronic Signature(s) Signed: 04/25/2022 6:13:50 PM By: Carlene Coria RN Entered By: Carlene Coria on 04/22/2022 10:24:57 [name] ([id number]) -------------------------------------------------------------------------------- Multi Wound Chart Details Patient Name: [name] Date of Service: 04/22/2022 10:15 AM Medical Record Number: [id number] Patient Account Number: [id number] Date of Birth/Sex: 05-05-1946 (76 y.o. F) Treating RN: Carlene Coria Primary Care Sparsh Callens: [name] Other Clinician: Referring Graysin Luczynski: [name] Treating Rhonna Holster/Extender: Skipper Cliche in Treatment: 20 Vital Signs Height(in): 69 Pulse(bpm): 11 Weight(lbs): 244 Blood Pressure(mmHg): 179/80 Body Mass Index(BMI): 36 Temperature(F): 97.8 Respiratory Rate(breaths/min): 18 Photos: [N/A:N/A] Wound Location: Right, Lateral Lower Leg Left Lower Leg N/A Wounding Event: Gradually Appeared Blister N/A Primary Etiology: Venous Leg Ulcer Venous Leg Ulcer N/A Comorbid History: Cataracts, Lymphedema, Sleep Cataracts, Lymphedema, Sleep N/A Apnea, Congestive Heart Failure, Apnea, Congestive Heart Failure, Hypertension, Osteoarthritis, Hypertension, Osteoarthritis, Neuropathy, Received Neuropathy, Received Chemotherapy, Received Radiation Chemotherapy, Received  Radiation Date Acquired: 10/27/2021 04/04/2022 N/A Weeks of Treatment: 25 2 N/A Wound Status: Open Open N/A Wound Recurrence: No No N/A Measurements L x W x D (cm) 5x4.5x0.1 1x1x0.1 N/A Area (cm) : 17.671 0.785 N/A Volume (cm) : 1.767 0.079 N/A % Reduction in Area: -1400.10% 98.70% N/A % Reduction in Volume: -1397.50% 98.70% N/A Classification: Full Thickness Without Exposed Full Thickness Without Exposed N/A Support Structures Support Structures Exudate Amount: Medium Medium N/A Exudate Type: Serosanguineous Serosanguineous N/A Exudate Color: red, brown red, brown N/A Granulation Amount: Medium (34-66%) Medium (34-66%) N/A Granulation Quality: Pink Red, Pink N/A Necrotic Amount: Medium (34-66%) Medium (34-66%) N/A Exposed Structures: Fat Layer (Subcutaneous Tissue): Fat Layer (Subcutaneous Tissue): N/A Yes Yes Fascia: No Fascia: No Tendon: No Tendon: No Muscle: No Muscle: No Joint: No Joint: No Bone: No Bone: No Epithelialization: None None N/A Treatment Notes Electronic Signature(s) Signed: 04/25/2022 6:13:50 PM By: Carlene Coria RN Entered By: Carlene Coria  on 04/22/2022 10:25:16 SOMALY, MARTENEY ([id number]) ARMIYAH, CAPRON ([id number]) -------------------------------------------------------------------------------- Multi-Disciplinary Care Plan Details Patient Name: TALEYA, WHITCHER Date of Service: 04/22/2022 10:15 AM Medical Record Number: [id number] Patient Account Number: [id number] Date of Birth/Sex: Dec 23, 1945 (76 y.o. F) Treating RN: Carlene Coria Primary Care Chevon Fomby: [name] Other Clinician: Referring Ayme Short: [name] Treating Ryla Cauthon/Extender: Skipper Cliche in Treatment: 22 Active Inactive Electronic Signature(s) Signed: 04/25/2022 6:13:50 PM By: Carlene Coria RN Entered By: Carlene Coria on 04/22/2022 10:25:06 [name] ([id number]) -------------------------------------------------------------------------------- Pain Assessment  Details Patient Name: [name] Date of Service: 04/22/2022 10:15 AM Medical Record Number: [id number] Patient Account Number: [id number] Date of Birth/Sex: [birth year] (76 y.o. F) Treating RN: Carlene Coria Primary Care Cheryle Dark: [name] Other Clinician: Referring Mily Malecki: [name] Treating Lakenzie Mcclafferty/Extender: Skipper Cliche in Treatment: 33 Active Problems Location of Pain Severity and Description of Pain Patient Has Paino No Site Locations Pain Management and Medication Current Pain Management: Electronic Signature(s) Signed: 04/25/2022 6:13:50 PM By: Carlene Coria RN Entered By: Carlene Coria on 04/22/2022 10:08:44 [name] ([id number]) -------------------------------------------------------------------------------- Patient/Caregiver Education Details Patient Name: [name] Date of Service: 04/22/2022 10:15 AM Medical Record Number: [id number] Patient Account Number: [id number] Date of Birth/Gender: [birth year] (76 y.o. F) Treating RN: Carlene Coria Primary Care Physician: [name] Other Clinician: Referring Physician: [name] Treating Physician/Extender: Skipper Cliche in Treatment: 78 Education Assessment Education Provided To: Patient Education Topics Provided Wound/Skin Impairment: Methods: Explain/Verbal Responses: State content correctly Electronic Signature(s) Signed: 04/25/2022 6:13:50 PM By: Carlene Coria RN Entered By: Carlene Coria on 04/22/2022 11:00:18 [name] ([id number]) -------------------------------------------------------------------------------- Wound Assessment Details Patient Name: [name] Date of Service: 04/22/2022 10:15 AM Medical Record Number: [id number] Patient Account Number: [id number] Date of Birth/Sex: 08-31-1946 (76 y.o. F) Treating RN: Carlene Coria Primary Care Shaine Mount: [name] Other Clinician: Referring Hadia Minier: [name] Treating Zacari Radick/Extender: Skipper Cliche  in Treatment: 65 Wound Status Wound Number: 4 Primary Venous Leg Ulcer Etiology: Wound Location: Right, Lateral Lower Leg Wound Open Wounding Event: Gradually Appeared Status: Date Acquired: 10/27/2021 Comorbid Cataracts, Lymphedema, Sleep Apnea, Congestive Heart Weeks Of Treatment: 25 History: Failure, Hypertension, Osteoarthritis, Neuropathy, Clustered Wound: No Received Chemotherapy, Received Radiation Photos Wound Measurements Length: (cm) 5 Width: (cm) 4.5 Depth: (cm) 0.1 Area: (cm) 17.671 Volume: (cm) 1.767 % Reduction in Area: -1400.1% % Reduction in Volume: -1397.5% Epithelialization: None Tunneling: No Undermining: No Wound Description Classification: Full Thickness Without Exposed Support Structures Exudate Amount: Medium Exudate Type: Serosanguineous Exudate Color: red, brown Foul Odor After Cleansing: No Slough/Fibrino Yes Wound Bed Granulation Amount: Medium (34-66%) Exposed Structure Granulation Quality: Pink Fascia Exposed: No Necrotic Amount: Medium (34-66%) Fat Layer (Subcutaneous Tissue) Exposed: Yes Necrotic Quality: Adherent Slough Tendon Exposed: No Muscle Exposed: No Joint Exposed: No Bone Exposed: No Treatment Notes Wound #4 (Lower Leg) Wound Laterality: Right, Lateral Cleanser Soap and Water Discharge Instruction: Gently cleanse wound with antibacterial soap, rinse and pat dry prior to dressing wounds Wound Cleanser TAWONA, FILSINGER ([id number]) Discharge Instruction: Wash your hands with soap and water. Remove old dressing, discard into plastic bag and place into trash. Cleanse the wound with Wound Cleanser prior to applying a clean dressing using gauze sponges, not tissues or cotton balls. Do not scrub or use excessive force. Pat dry using gauze sponges, not tissue or cotton balls. Peri-Wound Care Topical Primary Dressing Silvercel Small 2x2 (in/in) Discharge Instruction: Apply Silvercel Small 2x2 (in/in) as instructed Secondary  Dressing ABD Pad 5x9 (in/in) Discharge Instruction: Cover with ABD pad Secured With Compression Wrap 3-LAYER WRAP -  Profore Lite LF 3 Multilayer Compression Bandaging System Discharge Instruction: Apply 3 multi-layer wrap as prescribed. Compression Stockings Circaid Juxta Lite Compression Wrap Quantity: 1 Right Leg Compression Amount: 20-30 mmHg Discharge Instruction: Apply Circaid Juxta Lite Compression Wrap as directed Add-Ons Electronic Signature(s) Signed: 04/25/2022 6:13:50 PM By: Carlene Coria RN Entered By: Carlene Coria on 04/22/2022 10:24:21 [name] ([id number]) -------------------------------------------------------------------------------- Wound Assessment Details Patient Name: [name] Date of Service: 04/22/2022 10:15 AM Medical Record Number: [id number] Patient Account Number: [id number] Date of Birth/Sex: November 14, 1945 (76 y.o. F) Treating RN: Carlene Coria Primary Care Shaily Librizzi: [name] Other Clinician: Referring Kiylee Thoreson: [name] Treating Mikhaela Zaugg/Extender: Skipper Cliche in Treatment: 65 Wound Status Wound Number: 5 Primary Venous Leg Ulcer Etiology: Wound Location: Left Lower Leg Wound Open Wounding Event: Blister Status: Date Acquired: 04/04/2022 Comorbid Cataracts, Lymphedema, Sleep Apnea, Congestive Heart Weeks Of Treatment: 2 History: Failure, Hypertension, Osteoarthritis, Neuropathy, Clustered Wound: No Received Chemotherapy, Received Radiation Photos Wound Measurements Length: (cm) 1 Width: (cm) 1 Depth: (cm) 0.1 Area: (cm) 0.785 Volume: (cm) 0.079 % Reduction in Area: 98.7% % Reduction in Volume: 98.7% Epithelialization: None Tunneling: No Undermining: No Wound Description Classification: Full Thickness Without Exposed Support Structu Exudate Amount: Medium Exudate Type: Serosanguineous Exudate Color: red, brown res Foul Odor After Cleansing: No Slough/Fibrino Yes Wound Bed Granulation Amount: Medium (34-66%)  Exposed Structure Granulation Quality: Red, Pink Fascia Exposed: No Necrotic Amount: Medium (34-66%) Fat Layer (Subcutaneous Tissue) Exposed: Yes Tendon Exposed: No Muscle Exposed: No Joint Exposed: No Bone Exposed: No Treatment Notes Wound #5 (Lower Leg) Wound Laterality: Left Cleanser Soap and Water Discharge Instruction: Gently cleanse wound with antibacterial soap, rinse and pat dry prior to dressing wounds Wound Cleanser CAMANI, SESAY ([id number]) Discharge Instruction: Wash your hands with soap and water. Remove old dressing, discard into plastic bag and place into trash. Cleanse the wound with Wound Cleanser prior to applying a clean dressing using gauze sponges, not tissues or cotton balls. Do not scrub or use excessive force. Pat dry using gauze sponges, not tissue or cotton balls. Peri-Wound Care Topical Primary Dressing Xeroform 5x9-HBD (in/in) Discharge Instruction: Apply Xeroform 5x9-HBD (in/in) as directed Secondary Dressing ABD Pad 5x9 (in/in) Discharge Instruction: Cover with ABD pad Secured With Compression Wrap 3-LAYER WRAP - Profore Lite LF 3 Multilayer Compression Bandaging System Discharge Instruction: Apply 3 multi-layer wrap as prescribed. Compression Stockings Add-Ons Electronic Signature(s) Signed: 04/25/2022 6:13:50 PM By: Carlene Coria RN Entered By: Carlene Coria on 04/22/2022 10:24:39 [name] ([id number]) -------------------------------------------------------------------------------- Vitals Details Patient Name: [name] Date of Service: 04/22/2022 10:15 AM Medical Record Number: [id number] Patient Account Number: [id number] Date of Birth/Sex: 01-Jul-1946 (76 y.o. F) Treating RN: Carlene Coria Primary Care Daquawn Seelman: [name] Other Clinician: Referring Deshunda Thackston: [name] Treating Eissa Buchberger/Extender: Skipper Cliche in Treatment: 66 Vital Signs Time Taken: 10:08 Temperature (F): 97.8 Height (in): 69 Pulse (bpm):  78 Weight (lbs): 244 Respiratory Rate (breaths/min): 18 Body Mass Index (BMI): 36 Blood Pressure (mmHg): 179/80 Reference Range: 80 - 120 mg / dl Electronic Signature(s) Signed: 04/25/2022 6:13:50 PM By: Carlene Coria RN Entered By: Carlene Coria on 04/22/2022 10:08:29

## 2022-04-29 ENCOUNTER — Encounter: Payer: Medicare Other | Admitting: Physician Assistant

## 2022-04-29 ENCOUNTER — Other Ambulatory Visit: Payer: Self-pay | Admitting: Oncology

## 2022-04-29 DIAGNOSIS — L97812 Non-pressure chronic ulcer of other part of right lower leg with fat layer exposed: Secondary | ICD-10-CM | POA: Diagnosis not present

## 2022-04-29 NOTE — Progress Notes (Signed)
SHONTELL, PROSSER ([id number]) Visit Report for 04/29/2022 Chief Complaint Document Details Patient Name: [name], [name] Date of Service: 04/29/2022 2:00 PM Medical Record Number: [id number] Patient Account Number: [id number] Date of Birth/Sex: [birth year] (76 y.o. F) Treating RN: Carlene Coria Primary Care Provider: [name] Other Clinician: Referring Provider: [name] Treating Provider/Extender: Skipper Cliche in Treatment: 66 Information Obtained from: Patient Chief Complaint Bilateral LE Ulcers Electronic Signature(s) Signed: 04/29/2022 2:19:23 PM By: Worthy Keeler PA-C Entered By: Worthy Keeler on 04/29/2022 14:19:23 [name] ([id number]) -------------------------------------------------------------------------------- Physician Orders Details Patient Name: [name] Date of Service: 04/29/2022 2:00 PM Medical Record Number: [id number] Patient Account Number: [id number] Date of Birth/Sex: 03/03/1946 (76 y.o. 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 o Nurse Visit as needed Bathing/ Shower/ Hygiene o May shower with wound dressing protected with water repellent cover or cast protector. o No tub bath. Anesthetic (Use 'Patient Medications' Section for Anesthetic Order Entry) o Lidocaine applied to wound bed Edema Control - Lymphedema / Segmental Compressive Device / Other Bilateral Lower Extremities o Optional: One layer of unna paste to top  of compression wrap (to act as an anchor). - she needs this o 3 Layer Compression System for Lymphedema. - left o Elevate, Exercise Daily and Avoid Standing for Long Periods of Time. o Elevate legs to the level of the heart and pump ankles as often as possible o Elevate leg(s) parallel to the floor when sitting. o Compression Pump: Use compression pump on left lower extremity for 60 minutes, twice daily. - You may pump over wraps o Compression Pump: Use compression pump on right lower extremity for 60 minutes, twice daily. - You may pump over wraps Additional Orders / Instructions o Follow Nutritious Diet and Increase Protein Intake Wound Treatment Wound #4 - Lower Leg Wound Laterality: Right, Lateral Cleanser: Soap and Water 1 x Per Week/30 Days Discharge Instructions: Gently cleanse wound with antibacterial soap, rinse and pat dry prior to dressing wounds Cleanser: Wound Cleanser 1 x Per Week/30 Days Discharge Instructions: Wash your hands with soap and water. Remove old dressing, discard into plastic bag and place into trash. Cleanse the wound with Wound Cleanser prior to applying a clean dressing using gauze sponges, not tissues or cotton balls. Do not scrub or use excessive force. Pat dry using gauze sponges, not tissue or cotton balls. Primary Dressing: Silvercel Small 2x2 (in/in) 1 x Per Week/30 Days Discharge Instructions: Apply Silvercel Small 2x2 (in/in) as instructed Secondary Dressing: ABD Pad 5x9 (in/in) 1 x Per Week/30 Days Discharge Instructions: Cover with ABD pad Compression Wrap: 3-LAYER WRAP - Profore Lite LF 3 Multilayer Compression Bandaging System 1 x Per Week/30 Days Discharge Instructions: Apply 3 multi-layer wrap as prescribed. Compression Stockings: Circaid Juxta Lite Compression Wrap Right Leg Compression Amount: 20-30 mmHG Discharge Instructions: Apply Circaid Juxta Lite Compression Wrap as directed [name], [name] ([id number]) Electronic  Signature(s) Unsigned Entered ByCarlene Coria on 04/29/2022 14:32:41 Signature(s): Date(s): [name] ([id number]) -------------------------------------------------------------------------------- Problem List Details Patient Name: [name], [name] Date of Service: 04/29/2022 2:00 PM Medical Record Number: [id number] Patient Account Number: [id number] Date of  Birth/Sex: 1945/09/09 (76 y.o. F) Treating RN: Carlene Coria Primary Care Provider: [name] Other Clinician: Referring Provider: [name] Treating Provider/Extender: Skipper Cliche in Treatment: 66 Active Problems ICD-10 Encounter Code Description Active Date MDM Diagnosis I89.0 Lymphedema, not elsewhere classified 01/16/2021 No Yes I87.332 Chronic venous hypertension (idiopathic) with ulcer and inflammation of 01/16/2021 No Yes left lower extremity L97.812 Non-pressure chronic ulcer of other part of right lower leg with fat layer 01/07/2022 No Yes exposed Inactive Problems ICD-10 Code Description Active Date Inactive Date L97.822 Non-pressure chronic ulcer of other part of left lower leg with fat layer exposed 01/16/2021 01/16/2021 Resolved Problems Electronic Signature(s) Signed: 04/29/2022 2:19:19 PM By: Worthy Keeler PA-C Entered By: Worthy Keeler on 04/29/2022 14:19:18 [name] ([id number]) -------------------------------------------------------------------------------- [place] Details Patient Name: [name] Date of Service: 04/29/2022 Medical Record Number: [id number] Patient Account Number: [id number] Date of Birth/Sex: 10-02-1945 (76 y.o. F) Treating RN: Carlene Coria Primary Care Provider: [name] Other Clinician: Referring Provider: [name] Treating Provider/Extender: Skipper Cliche in Treatment: 66 Diagnosis Coding ICD-10 Codes Code Description I89.0 Lymphedema, not elsewhere classified I87.332 Chronic venous hypertension (idiopathic) with ulcer and  inflammation of left lower extremity L97.812 Non-pressure chronic ulcer of other part of right lower leg with fat layer exposed Facility Procedures CPT4: Description Modifier Quantity Code [id number] BILATERAL: Application of multi-layer venous compression system; leg (below knee), including 1 ankle and foot. Electronic Signature(s) Unsigned Entered By: Carlene Coria on 04/29/2022 15:21:52 Signature(s): Date(s):

## 2022-05-01 NOTE — Progress Notes (Signed)
VERNETTE, MOISE ([id number]) Visit Report for 04/29/2022 Arrival Information Details Patient Name: [name], [name] Date of Service: 04/29/2022 2:00 PM Medical Record Number: [id number] Patient Account Number: [id number] Date of Birth/Sex: 1946-01-11 (76 y.o. F) Treating RN: Carlene Coria Primary Care Graeme Menees: [name] Other Clinician: Referring Coron Rossano: [name] Treating Zahava Quant/Extender: Skipper Cliche in Treatment: 66 Visit Information History Since Last Visit All ordered tests and consults were completed: No Patient Arrived: Ambulatory Added or deleted any medications: No Arrival Time: 13:57 Any new allergies or adverse reactions: No Accompanied By: self Had a fall or experienced change in No Transfer Assistance: None activities of daily living that may affect Patient Identification Verified: Yes risk of falls: Secondary Verification Process Completed: Yes Signs or symptoms of abuse/neglect since last visito No Patient Requires Transmission-Based Precautions: No Hospitalized since last visit: No Patient Has Alerts: Yes Implantable device outside of the clinic excluding No Patient Alerts: NOT DIABETIC cellular tissue based products placed in the center since last visit: Has Dressing in Place as Prescribed: Yes Has Compression in Place as Prescribed: Yes Pain Present Now: No Electronic Signature(s) Signed: 05/01/2022 4:19:23 PM By: Carlene Coria RN Entered By: Carlene Coria on 04/29/2022 14:03:27 [name] ([id number]) -------------------------------------------------------------------------------- Clinic Level of Care Assessment Details Patient Name: [name] Date of Service: 04/29/2022 2:00 PM Medical Record Number: [id number] Patient Account Number: [id number] Date of Birth/Sex: [birth year] (76 y.o. F) Treating RN: Carlene Coria Primary Care Peighton Mehra: [name] Other Clinician: Referring Akisha Sturgill: [name] Treating Bascom Biel/Extender:  Skipper Cliche in Treatment: 66 Clinic Level of Care Assessment Items TOOL 1 Quantity Score '[]'$  - Use when EandM and Procedure is performed on INITIAL visit 0 ASSESSMENTS - Nursing Assessment / Reassessment '[]'$  - General Physical Exam (combine w/ comprehensive assessment (listed just below) when performed on new 0 pt. evals) '[]'$  - 0 Comprehensive Assessment (HX, ROS, Risk Assessments, Wounds Hx, etc.) ASSESSMENTS - Wound and Skin Assessment / Reassessment '[]'$  - Dermatologic / Skin Assessment (not related to wound area) 0 ASSESSMENTS - Ostomy and/or Continence Assessment and Care '[]'$  - Incontinence Assessment and Management 0 '[]'$  - 0 Ostomy Care Assessment and Management (repouching, etc.) PROCESS - Coordination of Care '[]'$  - Simple Patient / Family Education for ongoing care 0 '[]'$  - 0 Complex (extensive) Patient / Family Education for ongoing care '[]'$  - 0 Staff obtains Programmer, systems, Records, Test Results / Process Orders '[]'$  - 0 Staff telephones HHA, Nursing Homes / Clarify orders / etc '[]'$  - 0 Routine Transfer to another Facility (non-emergent condition) '[]'$  - 0 Routine Hospital Admission (non-emergent condition) '[]'$  - 0 New Admissions / Biomedical engineer / Ordering NPWT, Apligraf, etc. '[]'$  - 0 Emergency Hospital Admission (emergent condition) PROCESS - Special Needs '[]'$  - Pediatric / Minor Patient Management 0 '[]'$  - 0 Isolation Patient Management '[]'$  - 0 Hearing / Language / Visual special needs '[]'$  - 0 Assessment of Community assistance (transportation, D/C planning, etc.) '[]'$  - 0 Additional assistance / Altered mentation '[]'$  - 0 Support Surface(s) Assessment (bed, cushion, seat, etc.) INTERVENTIONS - Miscellaneous '[]'$  - External ear exam 0 '[]'$  - 0 Patient Transfer (multiple staff / Civil Service fast streamer / Similar devices) '[]'$  - 0 Simple Staple / Suture removal (25 or less) '[]'$  - 0 Complex Staple / Suture removal (26 or more) '[]'$  - 0 Hypo/Hyperglycemic Management (do not check if billed  separately) '[]'$  - 0 Ankle / Brachial Index (ABI) - do not check if billed separately Has the patient been seen at the hospital within the last  three years: Yes Total Score: 0 Level Of Care: ____ [name] ([id number]) Electronic Signature(s) Signed: 05/01/2022 4:19:23 PM By: Carlene Coria RN Entered By: Carlene Coria on 04/29/2022 14:29:41 [name] ([id number]) -------------------------------------------------------------------------------- Compression Therapy Details Patient Name: [name] Date of Service: 04/29/2022 2:00 PM Medical Record Number: [id number] Patient Account Number: [id number] Date of Birth/Sex: 01-01-1946 (76 y.o. F) Treating RN: Carlene Coria Primary Care Jaleesa Cervi: [name] Other Clinician: Referring Nyaja Dubu[place]: [name] Treating Keryn Nessler/Extender: Skipper Cliche in Treatment: 66 Compression Therapy Performed for Wound Assessment: Wound #4 Right,Lateral Lower Leg Performed By: Clinician Carlene Coria, RN Compression Type: Three Layer Post Procedure Diagnosis Same as Pre-procedure Electronic Signature(s) Signed: 05/01/2022 4:19:23 PM By: Carlene Coria RN Entered By: Carlene Coria on 04/29/2022 15:20:42 [name] ([id number]) -------------------------------------------------------------------------------- Compression Therapy Details Patient Name: [name] Date of Service: 04/29/2022 2:00 PM Medical Record Number: [id number] Patient Account Number: [id number] Date of Birth/Sex: 03/19/1946 (76 y.o. F) Treating RN: Carlene Coria Primary Care Vickey Boak: [name] Other Clinician: Referring Makensey Rego: [name] Treating Hugh Kamara/Extender: Skipper Cliche in Treatment: 66 Compression Therapy Performed for Wound Assessment: NonWound Condition Lymphedema - Left Leg Performed By: Clinician Carlene Coria, RN Compression Type: Three Layer Post Procedure Diagnosis Same as Pre-procedure Electronic Signature(s) Signed: 05/01/2022  4:19:23 PM By: Carlene Coria RN Entered By: Carlene Coria on 04/29/2022 15:21:41 [name] ([id number]) -------------------------------------------------------------------------------- Encounter Discharge Information Details Patient Name: [name] Date of Service: 04/29/2022 2:00 PM Medical Record Number: [id number] Patient Account Number: [id number] Date of Birth/Sex: Jan 09, 1946 (76 y.o. F) Treating RN: Carlene Coria Primary Care Suzetta Timko: [name] Other Clinician: Referring Georgena Weisheit: [name] Treating Olivine Hiers/Extender: Skipper Cliche in Treatment: 63 Encounter Discharge Information Items Discharge Condition: Stable Ambulatory Status: Wheelchair Discharge Destination: Home Transportation: Private Auto Accompanied By: self Schedule Follow-up Appointment: Yes Clinical Summary of Care: Electronic Signature(s) Signed: 05/01/2022 4:19:23 PM By: Carlene Coria RN Entered By: Carlene Coria on 04/29/2022 14:30:30 [name] ([id number]) -------------------------------------------------------------------------------- Lower Extremity Assessment Details Patient Name: [name] Date of Service: 04/29/2022 2:00 PM Medical Record Number: [id number] Patient Account Number: [id number] Date of Birth/Sex: Jan 18, 1946 (76 y.o. F) Treating RN: Carlene Coria Primary Care Ozias Dicenzo: [name] Other Clinician: Referring Brittinee Risk: [name] Treating Vitor Overbaugh/Extender: Skipper Cliche in Treatment: 2 Electronic Signature(s) Signed: 05/01/2022 4:19:23 PM By: Carlene Coria RN Entered By: Carlene Coria on 04/29/2022 14:17:16 [name] ([id number]) -------------------------------------------------------------------------------- Multi Wound Chart Details Patient Name: [name] Date of Service: 04/29/2022 2:00 PM Medical Record Number: [id number] Patient Account Number: [id number] Date of Birth/Sex: Apr 04, 1946 (76 y.o. F) Treating RN: Carlene Coria Primary Care Kaleyah Labreck: [name] Other Clinician: Referring Earnestine Tuohey: [name] Treating Korion Cuevas/Extender: Skipper Cliche in Treatment: 66 Vital Signs Height(in): 69 Pulse(bpm): 13 Weight(lbs): 244 Blood Pressure(mmHg): 168/78 Body Mass Index(BMI): 36 Temperature(F): 98.3 Respiratory Rate(breaths/min): 18 Photos: [5:No Photos] [N/A:N/A] Wound Location: Right, Lateral Lower Leg Left Lower Leg N/A Wounding Event: Gradually Appeared Blister N/A Primary Etiology: Venous Leg Ulcer Venous Leg Ulcer N/A Comorbid History: Cataracts, Lymphedema, Sleep Cataracts, Lymphedema, Sleep N/A Apnea, Congestive Heart Failure, Apnea, Congestive Heart Failure, Hypertension, Osteoarthritis, Hypertension, Osteoarthritis, Neuropathy, Received Neuropathy, Received Chemotherapy, Received Radiation Chemotherapy, Received Radiation Date Acquired: 10/27/2021 04/04/2022 N/A Weeks of Treatment: 26 3 N/A Wound Status: Open Open N/A Wound Recurrence: No No N/A Measurements L x W x D (cm) 5x4.5x0.1 0x0x0 N/A Area (cm) : 17.671 0 N/A Volume (cm) : 1.767 0 N/A % Reduction in Area: -1400.10% 100.00% N/A % Reduction in Volume: -1397.50% 100.00% N/A Classification: Full Thickness  Without Exposed Full Thickness Without Exposed N/A Support Structures Support Structures Exudate Amount: Medium Medium N/A Exudate Type: Serosanguineous Serosanguineous N/A Exudate Color: red, brown red, brown N/A Granulation Amount: Medium (34-66%) Medium (34-66%) N/A Granulation Quality: Pink Red, Pink N/A Necrotic Amount: Medium (34-66%) Medium (34-66%) N/A Exposed Structures: Fat Layer (Subcutaneous Tissue): Fat Layer (Subcutaneous Tissue): N/A Yes Yes Fascia: No Fascia: No Tendon: No Tendon: No Muscle: No Muscle: No Joint: No Joint: No Bone: No Bone: No Epithelialization: None None N/A Treatment Notes Electronic Signature(s) Signed: 05/01/2022 4:19:23 PM By: Carlene Coria RN Entered By: Carlene Coria  on 04/29/2022 14:17:50 [name] ([id number]) ARYANNA, SHAVER ([id number]) -------------------------------------------------------------------------------- Multi-Disciplinary Care Plan Details Patient Name: [name] Date of Service: 04/29/2022 2:00 PM Medical Record Number: [id number] Patient Account Number: [id number] Date of Birth/Sex: [birth year] (76 y.o. F) Treating RN: Carlene Coria Primary Care Iris Tatsch: [name] Other Clinician: Referring Daryel Kenneth: [name] Treating Adith Tejada/Extender: Skipper Cliche in Treatment: 62 Active Inactive Electronic Signature(s) Signed: 05/01/2022 4:19:23 PM By: Carlene Coria RN Entered By: Carlene Coria on 04/29/2022 14:17:30 [name] ([id number]) -------------------------------------------------------------------------------- Pain Assessment Details Patient Name: [name] Date of Service: 04/29/2022 2:00 PM Medical Record Number: [id number] Patient Account Number: [id number] Date of Birth/Sex: 11-Feb-1946 (76 y.o. F) Treating RN: Carlene Coria Primary Care Kaylenn Civil: [name] Other Clinician: Referring Javious Hallisey: [name] Treating Darleen Moffitt/Extender: Skipper Cliche in Treatment: 66 Active Problems Location of Pain Severity and Description of Pain Patient Has Paino No Site Locations Pain Management and Medication Current Pain Management: Electronic Signature(s) Signed: 05/01/2022 4:19:23 PM By: Carlene Coria RN Entered By: Carlene Coria on 04/29/2022 14:04:02 [name] ([id number]) -------------------------------------------------------------------------------- Patient/Caregiver Education Details Patient Name: [name] Date of Service: 04/29/2022 2:00 PM Medical Record Number: [id number] Patient Account Number: [id number] Date of Birth/Gender: 06-May-1946 (76 y.o. F) Treating RN: Carlene Coria Primary Care Physician: [name] Other Clinician: Referring Physician: [name] Treating Physician/Extender: Skipper Cliche in Treatment: 63 Education Assessment Education Provided To: Patient Education Topics Provided Wound/Skin Impairment: Methods: Explain/Verbal Responses: State content correctly Electronic Signature(s) Signed: 05/01/2022 4:19:23 PM By: Carlene Coria RN Entered By: Carlene Coria on 04/29/2022 15:21:58 [name] ([id number]) -------------------------------------------------------------------------------- Wound Assessment Details Patient Name: [name] Date of Service: 04/29/2022 2:00 PM Medical Record Number: [id number] Patient Account Number: [id number] Date of Birth/Sex: 11/24/1945 (76 y.o. F) Treating RN: Carlene Coria Primary Care Jatavis Malek: [name] Other Clinician: Referring Janki Dike: [name] Treating Tenae Graziosi/Extender: Skipper Cliche in Treatment: 66 Wound Status Wound Number: 4 Primary Venous Leg Ulcer Etiology: Wound Location: Right, Lateral Lower Leg Wound Open Wounding Event: Gradually Appeared Status: Date Acquired: 10/27/2021 Comorbid Cataracts, Lymphedema, Sleep Apnea, Congestive Heart Weeks Of Treatment: 26 History: Failure, Hypertension, Osteoarthritis, Neuropathy, Clustered Wound: No Received Chemotherapy, Received Radiation Photos Wound Measurements Length: (cm) 5 Width: (cm) 4.5 Depth: (cm) 0.1 Area: (cm) 17.671 Volume: (cm) 1.767 % Reduction in Area: -1400.1% % Reduction in Volume: -1397.5% Epithelialization: None Tunneling: No Undermining: No Wound Description Classification: Full Thickness Without Exposed Support Structures Exudate Amount: Medium Exudate Type: Serosanguineous Exudate Color: red, brown Foul Odor After Cleansing: No Slough/Fibrino Yes Wound Bed Granulation Amount: Medium (34-66%) Exposed Structure Granulation Quality: Pink Fascia Exposed: No Necrotic Amount: Medium (34-66%) Fat Layer (Subcutaneous Tissue) Exposed: Yes Necrotic Quality: Adherent  Slough Tendon Exposed: No Muscle Exposed: No Joint Exposed: No Bone Exposed: No Treatment Notes Wound #4 (Lower Leg) Wound Laterality: Right, Lateral Cleanser Soap and Water Discharge Instruction: Gently cleanse wound with antibacterial soap, rinse and pat dry prior  to dressing wounds Wound Cleanser SYA, NESTLER ([id number]) Discharge Instruction: [facility] your hands with soap and water. Remove old dressing, discard into plastic bag and place into trash. Cleanse the wound with Wound Cleanser prior to applying a clean dressing using gauze sponges, not tissues or cotton balls. Do not scrub or use excessive force. Pat dry using gauze sponges, not tissue or cotton balls. Peri-Wound Care Topical Primary Dressing Silvercel Small 2x2 (in/in) Discharge Instruction: Apply Silvercel Small 2x2 (in/in) as instructed Secondary Dressing ABD Pad 5x9 (in/in) Discharge Instruction: Cover with ABD pad Secured With Compression Wrap 3-LAYER WRAP - Profore Lite LF 3 Multilayer Compression Bandaging System Discharge Instruction: Apply 3 multi-layer wrap as prescribed. Compression Stockings Circaid Juxta Lite Compression Wrap Quantity: 1 Right Leg Compression Amount: 20-30 mmHg Discharge Instruction: Apply Circaid Juxta Lite Compression Wrap as directed Add-Ons Electronic Signature(s) Signed: 05/01/2022 4:19:23 PM By: Carlene Coria RN Entered By: Carlene Coria on 04/29/2022 14:16:37 [name] ([id number]) -------------------------------------------------------------------------------- Wound Assessment Details Patient Name: [name] Date of Service: 04/29/2022 2:00 PM Medical Record Number: [id number] Patient Account Number: [id number] Date of Birth/Sex: 12-Jan-1946 (76 y.o. F) Treating RN: Carlene Coria Primary Care Caelum Federici: [name] Other Clinician: Referring Ranen Doolin: [name] Treating Angellina Ferdinand/Extender: Skipper Cliche in Treatment: 66 Wound Status Wound Number: 5  Primary Venous Leg Ulcer Etiology: Wound Location: Left Lower Leg Wound Open Wounding Event: Blister Status: Date Acquired: 04/04/2022 Comorbid Cataracts, Lymphedema, Sleep Apnea, Congestive Heart Weeks Of Treatment: 3 History: Failure, Hypertension, Osteoarthritis, Neuropathy, Clustered Wound: No Received Chemotherapy, Received Radiation Wound Measurements Length: (cm) 0 Width: (cm) 0 Depth: (cm) 0 Area: (cm) Volume: (cm) % Reduction in Area: 100% % Reduction in Volume: 100% Epithelialization: None 0 Tunneling: No 0 Undermining: No Wound Description Classification: Full Thickness Without Exposed Support Structu Exudate Amount: Medium Exudate Type: Serosanguineous Exudate Color: red, brown res Foul Odor After Cleansing: No Slough/Fibrino Yes Wound Bed Granulation Amount: Medium (34-66%) Exposed Structure Granulation Quality: Red, Pink Fascia Exposed: No Necrotic Amount: Medium (34-66%) Fat Layer (Subcutaneous Tissue) Exposed: Yes Tendon Exposed: No Muscle Exposed: No Joint Exposed: No Bone Exposed: No Electronic Signature(s) Signed: 05/01/2022 4:19:23 PM By: Carlene Coria RN Entered By: Carlene Coria on 04/29/2022 14:17:01 [name] ([id number]) -------------------------------------------------------------------------------- Vitals Details Patient Name: [name] Date of Service: 04/29/2022 2:00 PM Medical Record Number: [id number] Patient Account Number: [id number] Date of Birth/Sex: December 20, 1945 (76 y.o. F) Treating RN: Carlene Coria Primary Care Tashica Provencio: [name] Other Clinician: Referring Joas Motton: [name] Treating Erika Slaby/Extender: Skipper Cliche in Treatment: 66 Vital Signs Time Taken: 14:00 Temperature (F): 98.3 Height (in): 69 Pulse (bpm): 65 Weight (lbs): 244 Respiratory Rate (breaths/min): 18 Body Mass Index (BMI): 36 Blood Pressure (mmHg): 168/78 Reference Range: 80 - 120 mg / dl Electronic Signature(s) Signed:  05/01/2022 4:19:23 PM By: Carlene Coria RN Entered By: Carlene Coria on 04/29/2022 14:03:51

## 2022-05-06 ENCOUNTER — Encounter: Payer: Medicare Other | Admitting: Physician Assistant

## 2022-05-06 DIAGNOSIS — L97812 Non-pressure chronic ulcer of other part of right lower leg with fat layer exposed: Secondary | ICD-10-CM | POA: Diagnosis not present

## 2022-05-06 NOTE — Progress Notes (Addendum)
AZAYLA, POLO ([id number]) Visit Report for 05/06/2022 Arrival Information Details Patient Name: [name], [name] Date of Service: 05/06/2022 2:00 PM Medical Record Number: [id number] Patient Account Number: [id number] Date of Birth/Sex: 02-25-1946 (76 y.o. F) Treating RN: Carlene Coria Primary Care Cheyanna Strick: [name] Other Clinician: Referring Rhyatt Muska: [name] Treating Kimber Fritts/Extender: Skipper Cliche in Treatment: 84 Visit Information History Since Last Visit All ordered tests and consults were completed: No Patient Arrived: Wheel Chair Added or deleted any medications: No Arrival Time: 13:48 Any new allergies or adverse reactions: No Accompanied By: self Had a fall or experienced change in No Transfer Assistance: None activities of daily living that may affect Patient Identification Verified: Yes risk of falls: Secondary Verification Process Completed: Yes Signs or symptoms of abuse/neglect since last visito No Patient Requires Transmission-Based Precautions: No Hospitalized since last visit: No Patient Has Alerts: Yes Implantable device outside of the clinic excluding No Patient Alerts: NOT DIABETIC cellular tissue based products placed in the center since last visit: Has Dressing in Place as Prescribed: Yes Pain Present Now: No Electronic Signature(s) Signed: 05/08/2022 8:44:03 PM By: Carlene Coria RN Entered By: Carlene Coria on 05/06/2022 13:49:20 [name] ([id number]) -------------------------------------------------------------------------------- Clinic Level of Care Assessment Details Patient Name: [name] Date of Service: 05/06/2022 2:00 PM Medical Record Number: [id number] Patient Account Number: [id number] Date of Birth/Sex: [birth year] (76 y.o. F) Treating RN: Carlene Coria Primary Care Dhilan Brauer: [name] Other Clinician: Referring Jaicey Sweaney: [name] Treating Machelle Raybon/Extender: Skipper Cliche in Treatment: 67 Clinic  Level of Care Assessment Items TOOL 1 Quantity Score '[]'$  - Use when EandM and Procedure is performed on INITIAL visit 0 ASSESSMENTS - Nursing Assessment / Reassessment '[]'$  - General Physical Exam (combine w/ comprehensive assessment (listed just below) when performed on new 0 pt. evals) '[]'$  - 0 Comprehensive Assessment (HX, ROS, Risk Assessments, Wounds Hx, etc.) ASSESSMENTS - Wound and Skin Assessment / Reassessment '[]'$  - Dermatologic / Skin Assessment (not related to wound area) 0 ASSESSMENTS - Ostomy and/or Continence Assessment and Care '[]'$  - Incontinence Assessment and Management 0 '[]'$  - 0 Ostomy Care Assessment and Management (repouching, etc.) PROCESS - Coordination of Care '[]'$  - Simple Patient / Family Education for ongoing care 0 '[]'$  - 0 Complex (extensive) Patient / Family Education for ongoing care '[]'$  - 0 Staff obtains Programmer, systems, Records, Test Results / Process Orders '[]'$  - 0 Staff telephones HHA, Nursing Homes / Clarify orders / etc '[]'$  - 0 Routine Transfer to another Facility (non-emergent condition) '[]'$  - 0 Routine Hospital Admission (non-emergent condition) '[]'$  - 0 New Admissions / Biomedical engineer / Ordering NPWT, Apligraf, etc. '[]'$  - 0 Emergency Hospital Admission (emergent condition) PROCESS - Special Needs '[]'$  - Pediatric / Minor Patient Management 0 '[]'$  - 0 Isolation Patient Management '[]'$  - 0 Hearing / Language / Visual special needs '[]'$  - 0 Assessment of Community assistance (transportation, D/C planning, etc.) '[]'$  - 0 Additional assistance / Altered mentation '[]'$  - 0 Support Surface(s) Assessment (bed, cushion, seat, etc.) INTERVENTIONS - Miscellaneous '[]'$  - External ear exam 0 '[]'$  - 0 Patient Transfer (multiple staff / Civil Service fast streamer / Similar devices) '[]'$  - 0 Simple Staple / Suture removal (25 or less) '[]'$  - 0 Complex Staple / Suture removal (26 or more) '[]'$  - 0 Hypo/Hyperglycemic Management (do not check if billed separately) '[]'$  - 0 Ankle / Brachial Index  (ABI) - do not check if billed separately Has the patient been seen at the hospital within the last three years: Yes Total Score: 0  Level Of Care: ____ [name] ([id number]) Electronic Signature(s) Signed: 05/08/2022 8:44:03 PM By: Carlene Coria RN Entered By: Carlene Coria on 05/06/2022 16:12:54 [name] ([id number]) -------------------------------------------------------------------------------- Compression Therapy Details Patient Name: [name] Date of Service: 05/06/2022 2:00 PM Medical Record Number: [id number] Patient Account Number: [id number] Date of Birth/Sex: Dec 27, 1945 (76 y.o. F) Treating RN: Carlene Coria Primary Care Wilbur Labuda: [name] Other Clinician: Referring Dovey Fatzinger: [name] Treating Paula Zietz/Extender: Skipper Cliche in Treatment: 67 Compression Therapy Performed for Wound Assessment: Wound #4 Right,Lateral Lower Leg Performed By: Clinician Carlene Coria, RN Compression Type: Three Layer Post Procedure Diagnosis Same as Pre-procedure Electronic Signature(s) Signed: 05/06/2022 3:49:22 PM By: Carlene Coria RN Entered By: Carlene Coria on 05/06/2022 15:49:22 [name] ([id number]) -------------------------------------------------------------------------------- Compression Therapy Details Patient Name: [name] Date of Service: 05/06/2022 2:00 PM Medical Record Number: [id number] Patient Account Number: [id number] Date of Birth/Sex: Dec 21, 1945 (76 y.o. F) Treating RN: Carlene Coria Primary Care Mai Longnecker: [name] Other Clinician: Referring Marylou Wages: [name] Treating Revecca Nachtigal/Extender: Skipper Cliche in Treatment: 67 Compression Therapy Performed for Wound Assessment: NonWound Condition Lymphedema - Left Leg Performed By: Clinician Carlene Coria, RN Compression Type: Three Layer Post Procedure Diagnosis Same as Pre-procedure Electronic Signature(s) Signed: 05/06/2022 3:49:36 PM By: Carlene Coria RN Entered By:  Carlene Coria on 05/06/2022 15:49:36 [name] ([id number]) -------------------------------------------------------------------------------- Encounter Discharge Information Details Patient Name: [name] Date of Service: 05/06/2022 2:00 PM Medical Record Number: [id number] Patient Account Number: [id number] Date of Birth/Sex: 03-09-1946 (76 y.o. F) Treating RN: Carlene Coria Primary Care Jane Broughton: [name] Other Clinician: Referring Cordon Gassett: [name] Treating Heraclio Seidman/Extender: Skipper Cliche in Treatment: 64 Encounter Discharge Information Items Discharge Condition: Stable Ambulatory Status: Wheelchair Discharge Destination: Home Transportation: Private Auto Accompanied By: self Schedule Follow-up Appointment: Yes Clinical Summary of Care: Electronic Signature(s) Signed: 05/06/2022 4:14:51 PM By: Carlene Coria RN Entered By: Carlene Coria on 05/06/2022 16:14:50 [name] ([id number]) -------------------------------------------------------------------------------- Lower Extremity Assessment Details Patient Name: [name] Date of Service: 05/06/2022 2:00 PM Medical Record Number: [id number] Patient Account Number: [id number] Date of Birth/Sex: May 21, 1946 (76 y.o. F) Treating RN: Carlene Coria Primary Care Thomasena Vandenheuvel: [name] Other Clinician: Referring Yamna Mackel: [name] Treating Carma Dwiggins/Extender: Skipper Cliche in Treatment: 51 Electronic Signature(s) Signed: 05/08/2022 8:44:03 PM By: Carlene Coria RN Entered By: Carlene Coria on 05/06/2022 13:58:58 [name] ([id number]) -------------------------------------------------------------------------------- Multi Wound Chart Details Patient Name: [name] Date of Service: 05/06/2022 2:00 PM Medical Record Number: [id number] Patient Account Number: [id number] Date of Birth/Sex: 1946-01-31 (76 y.o. F) Treating RN: Carlene Coria Primary Care Kamaryn Grimley: [name] Other  Clinician: Referring Abena Erdman: [name] Treating Gretna Bergin/Extender: Skipper Cliche in Treatment: 67 Vital Signs Height(in): 69 Pulse(bpm): 20 Weight(lbs): 244 Blood Pressure(mmHg): 188/90 Body Mass Index(BMI): 36 Temperature(F): 98.6 Respiratory Rate(breaths/min): 18 Photos: [4:No Photos] [N/A:N/A] Wound Location: [4:Right, Lateral Lower Leg] [N/A:N/A] Wounding Event: [4:Gradually Appeared] [N/A:N/A] Primary Etiology: [4:Venous Leg Ulcer] [N/A:N/A] Comorbid History: [4:Cataracts, Lymphedema, Sleep Apnea, Congestive Heart Failure, Hypertension, Osteoarthritis, Neuropathy, Received Chemotherapy, Received Radiation] [N/A:N/A] Date Acquired: [4:10/27/2021] [N/A:N/A] Weeks of Treatment: [4:27] [N/A:N/A] Wound Status: [4:Open] [N/A:N/A] Wound Recurrence: [4:No] [N/A:N/A] Measurements L x W x D (cm) [4:5x4.5x0.1] [N/A:N/A] Area (cm) : [4:17.671] [N/A:N/A] Volume (cm) : [4:1.767] [N/A:N/A] % Reduction in Area: [4:-1400.10%] [N/A:N/A] % Reduction in Volume: [4:-1397.50%] [N/A:N/A] Classification: [4:Full Thickness Without Exposed Support Structures] [N/A:N/A] Exudate Amount: [4:Medium] [N/A:N/A] Exudate Type: [4:Serosanguineous] [N/A:N/A] Exudate Color: [4:red, brown] [N/A:N/A] Granulation Amount: [4:Medium (34-66%)] [N/A:N/A] Granulation Quality: [4:Pink] [N/A:N/A] Necrotic Amount: [4:Medium (34-66%)] [N/A:N/A] Exposed Structures: [4:Fat Layer (  Subcutaneous Tissue): Yes Fascia: No Tendon: No Muscle: No Joint: No Bone: No None] [N/A:N/A N/A] Treatment Notes Electronic Signature(s) Signed: 05/08/2022 8:44:03 PM By: Carlene Coria RN Entered By: Carlene Coria on 05/06/2022 13:59:32 [name] ([id number]) -------------------------------------------------------------------------------- Multi-Disciplinary Care Plan Details Patient Name: [name] Date of Service: 05/06/2022 2:00 PM Medical Record Number: [id number] Patient Account Number: [id number] Date of  Birth/Sex: 1946/03/09 (76 y.o. F) Treating RN: Carlene Coria Primary Care Leighla Chestnutt: [name] Other Clinician: Referring Brae Schaafsma: [name] Treating Sherly Brodbeck/Extender: Skipper Cliche in Treatment: 12 Active Inactive Electronic Signature(s) Signed: 05/08/2022 8:44:03 PM By: Carlene Coria RN Entered By: Carlene Coria on 05/06/2022 13:59:08 [name] ([id number]) -------------------------------------------------------------------------------- Pain Assessment Details Patient Name: [name] Date of Service: 05/06/2022 2:00 PM Medical Record Number: [id number] Patient Account Number: [id number] Date of Birth/Sex: [birth year] (76 y.o. F) Treating RN: Carlene Coria Primary Care Brittiny Levitz: [name] Other Clinician: Referring Jahleah Mariscal: [name] Treating Zebulin Siegel/Extender: Skipper Cliche in Treatment: 67 Active Problems Location of Pain Severity and Description of Pain Patient Has Paino No Site Locations Pain Management and Medication Current Pain Management: Electronic Signature(s) Signed: 05/08/2022 8:44:03 PM By: Carlene Coria RN Entered By: Carlene Coria on 05/06/2022 13:50:00 [name] ([id number]) -------------------------------------------------------------------------------- Patient/Caregiver Education Details Patient Name: [name] Date of Service: 05/06/2022 2:00 PM Medical Record Number: [id number] Patient Account Number: [id number] Date of Birth/Gender: 01-Apr-1946 (76 y.o. F) Treating RN: Carlene Coria Primary Care Physician: [name] Other Clinician: Referring Physician: [name] Treating Physician/Extender: Skipper Cliche in Treatment: 55 Education Assessment Education Provided To: Patient Education Topics Provided Wound/Skin Impairment: Methods: Explain/Verbal Responses: State content correctly Electronic Signature(s) Signed: 05/08/2022 8:44:03 PM By: Carlene Coria RN Entered By: Carlene Coria on 05/06/2022  16:13:31 [name] ([id number]) -------------------------------------------------------------------------------- Wound Assessment Details Patient Name: [name] Date of Service: 05/06/2022 2:00 PM Medical Record Number: [id number] Patient Account Number: [id number] Date of Birth/Sex: 19-Apr-1946 (76 y.o. F) Treating RN: Carlene Coria Primary Care Toshio Slusher: [name] Other Clinician: Referring Graziella Connery: [name] Treating Sandie Swayze/Extender: Skipper Cliche in Treatment: 67 Wound Status Wound Number: 4 Primary Venous Leg Ulcer Etiology: Wound Location: Right, Lateral Lower Leg Wound Open Wounding Event: Gradually Appeared Status: Date Acquired: 10/27/2021 Comorbid Cataracts, Lymphedema, Sleep Apnea, Congestive Heart Weeks Of Treatment: 27 History: Failure, Hypertension, Osteoarthritis, Neuropathy, Clustered Wound: No Received Chemotherapy, Received Radiation Wound Measurements Length: (cm) 5 Width: (cm) 4.5 Depth: (cm) 0.1 Area: (cm) 17.671 Volume: (cm) 1.767 % Reduction in Area: -1400.1% % Reduction in Volume: -1397.5% Epithelialization: None Tunneling: No Undermining: No Wound Description Classification: Full Thickness Without Exposed Support Structures Exudate Amount: Medium Exudate Type: Serosanguineous Exudate Color: red, brown Foul Odor After Cleansing: No Slough/Fibrino Yes Wound Bed Granulation Amount: Medium (34-66%) Exposed Structure Granulation Quality: Pink Fascia Exposed: No Necrotic Amount: Medium (34-66%) Fat Layer (Subcutaneous Tissue) Exposed: Yes Necrotic Quality: Adherent Slough Tendon Exposed: No Muscle Exposed: No Joint Exposed: No Bone Exposed: No Treatment Notes Wound #4 (Lower Leg) Wound Laterality: Right, Lateral Cleanser Soap and Water Discharge Instruction: Gently cleanse wound with antibacterial soap, rinse and pat dry prior to dressing wounds Wound Cleanser Discharge Instruction: Wash your hands with soap and  water. Remove old dressing, discard into plastic bag and place into trash. Cleanse the wound with Wound Cleanser prior to applying a clean dressing using gauze sponges, not tissues or cotton balls. Do not scrub or use excessive force. Pat dry using gauze sponges, not tissue or cotton balls. Peri-Wound Care Topical Primary Dressing Silvercel Small 2x2 (in/in) Discharge Instruction:  Apply Silvercel Small 2x2 (in/in) as instructed Secondary Dressing ABD Pad 5x9 (in/in) Discharge Instruction: Cover with ABD pad PROSPERITY, DARROUGH ([id number]) Secured With Compression Wrap 3-LAYER WRAP - Profore Lite LF 3 Multilayer Compression Bandaging System Discharge Instruction: Apply 3 multi-layer wrap as prescribed. Compression Stockings Circaid Juxta Lite Compression Wrap Quantity: 1 Right Leg Compression Amount: 20-30 mmHg Discharge Instruction: Apply Circaid Juxta Lite Compression Wrap as directed Add-Ons Electronic Signature(s) Signed: 05/08/2022 8:44:03 PM By: Carlene Coria RN Entered By: Carlene Coria on 05/06/2022 13:58:44 [name] ([id number]) -------------------------------------------------------------------------------- Lester Details Patient Name: [name] Date of Service: 05/06/2022 2:00 PM Medical Record Number: [id number] Patient Account Number: [id number] Date of Birth/Sex: 02/22/1946 (76 y.o. F) Treating RN: Carlene Coria Primary Care Roverto Bodmer: [name] Other Clinician: Referring Shawnte Demarest: [name] Treating Alysha Doolan/Extender: Skipper Cliche in Treatment: 28 Vital Signs Time Taken: 13:49 Temperature (F): 98.6 Height (in): 69 Pulse (bpm): 68 Weight (lbs): 244 Respiratory Rate (breaths/min): 18 Body Mass Index (BMI): 36 Blood Pressure (mmHg): 188/90 Reference Range: 80 - 120 mg / dl Electronic Signature(s) Signed: 05/08/2022 8:44:03 PM By: Carlene Coria RN Entered By: Carlene Coria on 05/06/2022 13:49:50

## 2022-05-06 NOTE — Progress Notes (Addendum)
[name], [name] ([id number]) Visit Report for 05/06/2022 Chief Complaint Document Details Patient Name: [name], [name] Date of Service: 05/06/2022 2:00 PM Medical Record Number: [id number] Patient Account Number: [id number] Date of Birth/Sex: [birth year] (76 y.o. F) Treating RN: Carlene Coria Primary Care Provider: [name] Other Clinician: Referring Provider: [name] Treating Provider/Extender: Skipper Cliche in Treatment: 14 Information Obtained from: Patient Chief Complaint Bilateral LE Ulcers Electronic Signature(s) Signed: 05/06/2022 1:48:05 PM By: Worthy Keeler PA-C Entered By: Worthy Keeler on 05/06/2022 13:48:05 [name] ([id number]) -------------------------------------------------------------------------------- HPI Details Patient Name: [name] Date of Service: 05/06/2022 2:00 PM Medical Record Number: [id number] Patient Account Number: [id number] Date of Birth/Sex: 10-Dec-1945 (76 y.o. F) Treating RN: Carlene Coria Primary Care Provider: [name] Other Clinician: Referring Provider: [name] Treating Provider/Extender: Skipper Cliche in Treatment: 65 History of Present Illness HPI Description: 03/15/2020 upon evaluation today patient presents today for initial evaluation here in our clinic concerning a wound that is on the left posterior lower extremity. Unfortunately this has been giving the patient some discomfort at this point which she notes has been affected her pretty much daily. The patient does have a history of venous insufficiency, hypertension, congestive heart failure, and a history of breast cancer. Fortunately there does not appear to be evidence of active infection at this time which is great news. No fevers, chills, nausea, vomiting, or diarrhea. Wound is not extremely large but does have some slough covering the surface of the wound. This is going require sharp debridement today. 03/15/2020 upon evaluation today patient  appears to be doing fairly well in regard to her wound. She does have some slough noted on the surface of the wound currently but I do believe the Iodoflex has been beneficial over the past week. There is no signs of active infection at this time which is great news and overall very pleased with the progress. No fevers, chills, nausea, vomiting, or diarrhea. 04/02/2020 upon evaluation today patient appears to be doing a little better in regard to her wound size wise this is not tremendously smaller she does have some slough buildup on the surface of the wound. With that being said this is going require some sharp debridement today to clear away some of the necrotic debris. Fortunately there is no evidence of active infection at this time. No fevers, chills, nausea, vomiting, or diarrhea. 04/10/2020 on evaluation today patient appears to be doing well with regard to her ulcer which is measuring somewhat smaller today. She actually has more granulation tissue noted which is also good news is no need for sharp debridement today. Fortunately there is no evidence of infection either which is also excellent. No fevers, chills, nausea, vomiting, or diarrhea. 04/26/2020 on evaluation today patient's wound actually is showing signs of good improvement which is great news. There does not appear to be any evidence of active infection. I do believe she is tolerating the collagen at this point. 05/03/2020 upon evaluation today patient's wound actually showed signs of good granulation at this time there does not appear to be any evidence of active infection which is great news and overall very pleased with where things stand. With that being said I do believe that the patient is can require some sharp debridement today but fortunately nothing too significant. 05/11/20 on evaluation today patient appears to be doing well in regard to her leg ulcer. She's been tolerating the dressing changes without complication.  Fortunately there is no signs of active infection at this time.  Overall I'm very pleased with where things stand. 05/18/2020 upon evaluation today patient's wound is showing signs of improvement is very dry and I think the collagen for the most part just dried to the wound bed. I am going to clean this away with sharp debridement in order to get this under control in my opinion. 05/25/2020 upon evaluation today patient actually appears to be doing well in regard to her leg ulcer. In fact upon inspection it appears she could potentially be completely healed but that is not guaranteed based on what we are seeing. There does not appear to be any signs of active infection which is great news. 05/31/2020 on evaluation today patient appears to be doing well with regard to her wounds. She in fact appears to be almost completely healed on the left leg there is just a very small opening remaining point 1 06/08/2020 upon evaluation today patient appears to be doing excellent in regard to her leg ulcer on the left in fact it appears to be that she is completely healed as of today. I see no signs of active infection at this time which is great news. No fevers, chills, nausea, vomiting, or diarrhea. READMISSION 01/16/2021 Patient that we have had in this clinic with a wound on the left posterior calf in the summer 2021 extending into October. She was felt to have chronic venous insufficiency. Per the patient's description she was discharged with what sounds like external compression stockings although she could not afford the "$75 per leg". She therefore did not get anything. Apparently her wound that she has currently is been in existence since January. She has been followed at vein and vascular with Unna boots and I think calcium alginate. She had ABIs done on 06/25/2020 that showed an noncompressible ABI on the right leg and the left leg but triphasic waveforms with good great toe pressures and a TBI of 0.90 on the  right and 0.91 on the left Surprisingly the patient also had venous reflux studies that were really unremarkable. This included no evidence of DVTs or SVTs but there was no evidence of deep venous insufficiency or superficial venous insufficiency in the greater saphenous or short saphenous veins bilaterally. I would wonder about more central venous issues or perhaps this is all lymphedema 01/25/2021 upon evaluation today patient appears to be doing decently well and guard to her wound. The good news is the Iodoflex that is job she saw Dr. Dellia Nims last week for readmission and to be honest I think that this has done extremely well over that week. I do believe that she can tolerate the sharp debridement today which will be good. I think that cleaning off the wound will likely allow [place] to be able to use a different type of dressing I do not think the Iodoflex will even be necessary based on how clean the wound looks. Fortunately there is no sign of active infection at this time which is great news. No fevers, chills, nausea, vomiting, or diarrhea. 02/12/2021 upon evaluation today patient appears to be doing well with regard to her leg ulcer. We did switch to alginate when she came for nurse visit I think is doing much better for her. Fortunately there does not appear to be any signs of active infection at this time which is great news. No fevers, chills, nausea, vomiting, or diarrhea. [name], [name] ([id number]) 02/18/2021 upon evaluation today patient's wound is actually showing signs of good improvement. I am very pleased with where things stand  currently. I do not see any signs of active infection which is great news and overall I feel like she is making good progress. The patient likewise is happy that things are doing so well and that this is measuring somewhat smaller. 02/25/2021 upon evaluation today patient actually appears to be doing quite well in regard to her wounds. She has been tolerating the  dressing changes without complication. Fortunately there does not appear to be any signs of active infection which is great news and overall I am extremely pleased with where things stand. No fevers, chills, nausea, vomiting, or diarrhea. She does have a lot of edema I really do think she needs compression socks to be worn in order to prevent things from causing additional issues for her currently. Especially on the right leg she should be wearing compression socks all the time to be honest. 03/04/2021 upon evaluation today patient's leg ulcer actually appears to be doing pretty well which is great news. There does not appear to be any significant change but overall the appearance is better even though the size is not necessarily reflecting a great improvement. Fortunately there does not appear to be any signs of active infection. No fevers, chills, nausea, vomiting, or diarrhea. 03/12/2021 upon evaluation today patient appears to be doing well with regard to her wounds. She has been tolerating the dressing changes without complication. The good news is that the wound on her left lateral leg is doing much better and is showing signs of good improvement. Overall her swelling is controlled well as well. She does need some compression socks she plans to go Jefferey socks next week. 03/18/2021 on evaluation today patient's wound actually is showing signs of good improvement. She does have a little bit of slough this can require some sharp debridement today. 03/25/2021 upon evaluation today patient appears to be doing well with regard to her wound on the left lateral leg. She has been tolerating the dressing changes without complication. Fortunately there does not appear to be any signs of active infection at this time. No fever chills no 04/01/2021 upon evaluation today patient's wound is showing signs of improvement and overall very pleased with where things stand at this point. There is no evidence of active  infection which is great news as well and in general I think that she is making great progress. 04/09/2021 upon evaluation today patient appears to be doing well with regard to her ankle ulcer. She is making good progress currently which is great news. There does not appear to be any signs of infection also excellent news. In general I am extremely pleased with where things stand today. No fevers, chills, nausea, vomiting, or diarrhea. 04/23/2021 upon inspection today patient appears to be doing quite well with regard to her leg ulcer. She has been tolerating the dressing changes without complication. She is can require some sharp debridement today but overall seems to be doing excellent. 05/06/2021 upon evaluation today patient unfortunately appears to be doing poorly overall in regard to her swelling. She is actually significantly more swollen than last week despite the fact that her wound is doing better this is not a good trend. I think that she probably needs to see her cardiologist ASAP and I discussed that with her today. This includes the fact that honestly I think she probably is getting need an increase in her diuretic or something to try to clear away some of the excess fluid that she is experiencing at the moment. She is  not been doing her pumps even twice a day but again right now more concerned that if she were to do the pump she would fluid overload her lungs causing other issues as far as her breathing is concerned. Obviously I think she needs to contact them and move up the appointment for the 12th of something sooner 2 weeks is a bit too long to wait with how swollen she has. 05/14/2021 upon evaluation today patient's wound actually showing signs of being a little bit smaller compared to previous. She has been making good progress however. Fortunately there does not appear to be any evidence of active infection which is great. Overall I am extremely pleased in that regard. Nonetheless I am  still concerned that she is having quite a bit of issue as far as the swelling is concerned she has been placed on fluid restriction as well as an increase in the furosemide by her doctor. We will see how things progress. 05/21/2021 upon evaluation today patient appears to be doing well with regard to her wound in general. She has been tolerating the dressing changes without complication. With that being said she has been using silver cell for a while and while this was showing signs of improvement it is now gotten to the point where this has slowed down quite significantly. I do believe that switching to a different dressing may be beneficial for her today. 05/28/2021 upon evaluation today patient actually appears to be doing quite well in regard to her wound. In fact there is really not any need for sharp debridement today based on what I see. The Hydrofera Blue is doing great and overall I think that she is managing quite nicely with the compression wrapping. Her edema is still down quite a bit with the wrapping in place. 06/11/2021 upon evaluation today patient appears to be doing well with regard to her leg ulcer. She has been tolerating the dressing changes without complication. Fortunately there does not appear to be any signs of active infection at this time which is great news. No fevers, chills, nausea, vomiting, or diarrhea. She is going require some sharp debridement today. 10/11; left leg ulcer much better looking and with improved surface area. She is using Hydrofera Blue under 3 layer compression. She does not have any stocking on the right leg which in itself has very significant nonpitting edema 06/25/2021 upon evaluation today patient appears to be doing well with regard to the wound on her left leg. She has been tolerating the dressing changes without complication and overall I am extremely pleased with where things stand today. I do not see any evidence of infection which is great  news. 07/02/2021 upon evaluation today patient's wound actually showing signs of excellent improvement and actually very pleased with where we stand today and the overall appearance. Fortunately there does not appear to be any signs of active infection. No fevers, chills, nausea, vomiting, or diarrhea. 07/09/2021 upon evaluation today patient appears to be doing better in regard to her wound this is measuring smaller and she is headed in the appropriate direction based on what I am seeing currently. I do not see any evidence of active infection systemically which is great news. 07/16/2021 upon evaluation today patient appears to be doing pretty well currently in regard to her leg ulcer. This is measuring smaller and looking much better this is great news. Fortunately there does not appear to be any evidence of active infection systemically which is great news as well.  07/23/2021 upon evaluation today patient's wound is actually showing signs of good improvement this is definitely measuring smaller. I do not see any signs of infection which is great news and overall very pleased in that regard. Overall I think that she is doing well with the Jahnay Lantier, [place] ([id number]) [place]. 07/30/2020 upon evaluation today patient appears to be doing well in regard to her wound. She has been tolerating the dressing changes without complication think the Hydrofera Blue at this point is actually causing her to dry out a lot as far as the wound bed is concerned. Subsequently I think that we need to try to see what we can do to improve this. Fortunately there does not appear to be any evidence of active infection at this time which is great news. 08/06/2021 upon evaluation today patient appears to be doing well with regards to her wound. Is not measuring significantly smaller initial inspection but upon closer inspection she has a lot of new skin growth across the central portion of the wound. I think will  get very close to complete resolution. 08/13/2021 upon evaluation today patient appears to be doing well with regard to her wound. In fact this appears to be I believe healed although there is still some question as to whether it is completely so. I am concerned about the fact that to be honest she still has some evidence of dry skin that could be just trapping something underneath I still want to debride anything as I am afraid of causing some damage to the good skin for that reason I Georgina Peer probably monitor for 1 more week before completely closing everything out. 08/20/2021 upon evaluation today patient actually appears to be doing excellent. In fact she appears to be completely healed. This was the case last week as well we just wanted to make sure nothing reopened since she does not really have an ability to put on compression socks this is good to be something we need to make sure was well-healed. Nonetheless I think we have achieved that goal as of today. 12/27; this is a patient we discharged 2 weeks ago with wounds on her left lower leg laterally. She was supposed to get stockings and really did not get them. She developed blistering and reopening probably sometime late last week. She has 3 areas in the same area as previously. The mid calf dimensions on the left went from 39 to 51 cm today. She also has very significant edema on the right leg but as far as I am aware has not had wounds in this area 1/4; patient presents for follow-up. She has tolerated the compression wrap well. She has no issues or complaints today. 09/17/2021 upon evaluation today patient appears to be doing well with regard to her wounds. She has been tolerating the dressing changes without complication. Fortunately there does not appear to be any signs of active infection at this time. No fevers, chills, nausea, vomiting, or diarrhea. 10/01/2021 upon evaluation today patient appears to be doing well with regard to her  wound this is actually measuring better and looking smaller and I am very pleased in that regard. Fortunately I do not see any evidence of active infection locally nor systemically at this point. No fevers, chills, nausea, vomiting, or diarrhea. 10/08/2021 upon evaluation patient's wounds are actually showing some signs of improvement measuring a little bit smaller and looking a little bit better. Overall I do not think there is any need for sharp debridement today which  is good news. No fevers, chills, nausea, vomiting, or diarrhea. 10/14/2021 upon evaluation today patient appears to be doing well with regard to her wound. She has been tolerating the dressing changes without complication and overall I am extremely pleased with where things stand today. There is a little bit of film on the surface of the wounds but this was carefully cleaned away with just saline and gauze she really did not want me to do any sharp debridement today. 10/29/2021 upon evaluation today patient appears to be doing quite well in regard to her wounds. She is actually showing signs of excellent improvement which is great news and overall I feel like we are on the right track at this time. She does have a wound on both legs and she does need something compression wise when she heals for that reason we will get a go and see about ordering her bilateral juxta lite compression wraps. 11/05/2021 upon evaluation today patient appears to be doing well with regard to her wound. She has been tolerating the dressing changes without complication. Fortunately I do not see any evidence of active infection locally nor systemically at this time which is great news. No fevers, chills, nausea, vomiting, or diarrhea. 11/12/2021 upon evaluation today patient appears to be doing well with regard to her wounds. She is doing excellent on the left on the right this is not doing quite as well there is some slough and biofilm buildup. 3/14; patient  presents for follow-up. She has no issues or complaints today. She has tolerated the compression wraps well. 11/26/2021 upon evaluation today patient appears to be doing well with regard to her wounds. Both are showing signs of good improvement which is great news. I do not see any evidence of active infection and overall think she is healing quite nicely. 12/03/2021 upon evaluation today patient appears to be doing well with regard to her wounds. Both are showing signs of excellent improvement I am very pleased with where things stand and I think that she is making good progress here. Fortunately I do not see any signs of active infection locally or systemically which is great news. No fevers, chills, nausea, vomiting, or diarrhea. 12-10-2021 upon evaluation today patient's wounds actually appear to be doing awesome. I am extremely pleased with where we stand I think she is making excellent progress. I do not see any signs of active infection locally or systemically at this time. 12-17-2021 upon evaluation today patient's wounds are actually showing signs of good improvement. Fortunately I do not see any evidence of active infection locally nor systemically which is great news. Overall I do not believe that she is showing any signs of significant worsening which is good news. With that being said I do believe that she has a little bit of film on the surface of the wound I was actually able to clear this away with saline and gauze no sharp debridement was necessary. 12-24-2021 upon evaluation today patient appears to be doing well with regard to her wounds. I do not see any signs of active infection currently which is great news and overall I think that we are on the right track here. No fevers, chills, nausea, vomiting, or diarrhea. 12-31-2021 upon evaluation today patient appears to be doing well with regard to her wounds. Both are showing signs of improvement the wound on the right leg is going require  some sharp debridement on the left seems to be doing quite well. 01-07-2022 upon evaluation today patient  appears to be doing well currently in regard to her wounds. She is actually making good progress bilaterally. Both of them require little bit of sharp debridement but not too much which is great news. [place], [place] ([id number]) Upon inspection patient's wound bed actually showed signs of good granulation and epithelization on both legs. She still is having significant issues with the right leg compared to the left although I do feel like both are actually showing signs of pretty good improvement. 01-21-2022 upon evaluation today patient's wound is actually showing signs of excellent improvement I am very pleased with where we stand currently. I do not see any signs of active infection locally or systemically which is great news. No fevers, chills, nausea, vomiting, or diarrhea. 01-28-2022 upon evaluation today patient appears to be doing well with regard to her wound on the left leg this is pretty much just about healed which is great news. Unfortunately in regard to the wound on her right leg this is not doing nearly as well in fact I think we probably need to see about a culture today that was discussed with her. 02-04-2022 upon evaluation today patient appears to be doing well with regard to her left leg which in fact might actually be completely healed I am not 100% sure. Nonetheless this is shown signs of excellent improvement which is great news. With that being said in regards to the right leg there is some slough and film buildup on the surface of the wound although she is feeling much better than last week this does appear to be doing much better than it was last week. Fortunately there does not appear to be any signs of active infection locally or systemically at this time. 02-11-2022 upon evaluation today patient's wounds appear to be doing decently well. In fact the left leg is healed  although there is brand-new skin were definitely given still need to wrap her for the time being. On the right leg this is showing signs of some epithelial growth in the middle of the wound it still measures the same because there is still a larger area with speckled openings but again as far as the entire area being open this is significantly improved which is great news. I am very pleased. 6/13; left leg remains healed and she has a juxta light to apply to it today. There is some concern about whether she is going to be able to do this at home. She lives with a daughter although she has her own disability. On the right the wound looks smaller to me nice epithelialization good edema control. 02-25-2022 upon evaluation today patient appears to be doing well with regard to her legs the left leg is still healed the right leg is doing much better. I think we are on the right track here. With that being said unfortunately she continues to have significant issues with some other problems and she actually tells me on Saturday she was having lunch and went inside to sit down when she tells me she had all of a sudden a sharp sensation that went from her hand through her arm into her leg and her neck and she tells me that she could not see anything for the next hour it was completely black. Slowly after this her vision started to return and has been normal since. It sounds to me as if she may have had a TIA or mini stroke at this point. Nonetheless I think that she needs to have this  checked out ASAP. Her primary care provider is Dr. Clide Deutscher at [facility]. We will get a try to get in touch with him. 03-10-2022 upon evaluation today patient's wound actually showed signs of good improvement. She seems to be making excellent progress here. I do not see any evidence of active infection locally or systemically which is great news. No fevers, chills, nausea, vomiting, or diarrhea. She was in the hospital since have  seen her last she ended up finding out that she was having syncopal episodes. That is what was going on with the blacking out and losing vision. Subsequently they adjusted some of her medication she seems to be doing better. 03-18-2022 upon evaluation today patient's wound is actually showing signs of good granulation and epithelization at this point. Fortunately I do not see any signs of active infection locally or systemically which is great news. 03-25-2022 any evidence of active infection locally or systemically which is great news. Any evidence of active infection which is great news and overall the wound is looking better. Upon evaluation today patient appears to be doing well with regard to her leg ulcer. She has been tolerating the dressing changes without complication. Fortunately I do not see 7/25; the patient's wound is on the right lateral lower extremity using silver alginate 3 layer compression. She has chronic venous insufficiency and lymphedema. We are using a juxta lite on the left leg which does not have any open wounds but she is leaving this on all week it seems that there just is not a way to get this changed at home 04-08-2022 upon evaluation today patient appears to be doing well currently in regard to her wound. She has been tolerating the dressing changes without complication. Fortunately I do not see any evidence of active infection locally or systemically which is great news. No fever or chills noted her left leg has reopened at this point. 04-15-2022 upon evaluation patient's wounds actually are showing signs of improvement which is great news. Little by little I do believe her making progress here. In regard to the right leg this is looking better though the measurement does not speak to it there is a lot of new skin growth. On the left leg this is significantly smaller compared to last week most of the blistered area has completely closed. 04-22-2022 upon evaluation today  patient appears to be doing excellent in regard to her wounds both are showing signs of improvement. Her knees and above are still extremely swollen the legs were rewrapped them are down significantly. With that being said I do not think that we will get a be able to keep them that way once were not rapid noted that she really has no way to be able to put juxta lites on and off and nobody to help her with that. That is the biggest concern that we have here at this point. Fortunately I do not think that there is any evidence of infection which is great news. 04-29-2022 upon evaluation today patient appears to be doing well with regard to her wounds. She has been tolerating the dressing changes without complication. Fortunately there does not appear to be any evidence of infection locally or systemically which is great news and overall I am extremely pleased in that regard. Left leg is healed the right leg is very close we discussed today the possibility of her granddaughter helping with putting on and off compression wraps for her. 05-06-2022 upon evaluation today patient appears to be doing  well currently in regard to her left leg which is showing signs of being completely healed. With regard to her right leg she still has an open area though this is doing much better there is a lot less drainage than what we previously noted. Fortunately I see no evidence of active infection locally or systemically at this time which is great news. Electronic Signature(s) Signed: 05/06/2022 2:43:56 PM By: Worthy Keeler PA-C Entered By: Worthy Keeler on 05/06/2022 14:43:56 [name] ([id number]) -------------------------------------------------------------------------------- Physical Exam Details Patient Name: [name] Date of Service: 05/06/2022 2:00 PM Medical Record Number: [id number] Patient Account Number: [id number] Date of Birth/Sex: 1945/09/26 (76 y.o. F) Treating RN: Carlene Coria Primary  Care Provider: [name] Other Clinician: Referring Provider: [name] Treating Provider/Extender: Skipper Cliche in Treatment: 31 Constitutional Well-nourished and well-hydrated in no acute distress. Respiratory normal breathing without difficulty. Psychiatric this patient is able to make decisions and demonstrates good insight into disease process. Alert and Oriented x 3. pleasant and cooperative. Notes Upon inspection patient's wound bed actually showed signs of good granulation and epithelization at this point. Fortunately I do not see any evidence of active infection which is great news and overall I do believe that the patient is headed in the right direction based on what I am seeing. The left leg again is healed I would like to see if her granddaughter come in so we can show her how to put the Velcro wrap on since she may be helping her take this on and off. Also would like to have the patient continue with the compression wraps for the time being until we can try to get this set up. Electronic Signature(s) Signed: 05/06/2022 2:44:16 PM By: Worthy Keeler PA-C Entered By: Worthy Keeler on 05/06/2022 14:44:16 [name] ([id number]) -------------------------------------------------------------------------------- Physician Orders Details Patient Name: [name] Date of Service: 05/06/2022 2:00 PM Medical Record Number: [id number] Patient Account Number: [id number] Date of Birth/Sex: May 17, 1946 (76 y.o. F) Treating RN: Carlene Coria Primary Care Provider: [name] Other Clinician: Referring Provider: [name] Treating Provider/Extender: Skipper Cliche in Treatment: 46 Verbal / Phone Orders: No Diagnosis Coding ICD-10 Coding Code Description I89.0 Lymphedema, not elsewhere classified I87.332 Chronic venous hypertension (idiopathic) with ulcer and inflammation of left lower extremity L97.812 Non-pressure chronic ulcer of other part of right  lower leg with fat layer exposed Follow-up Appointments o Return Appointment in 1 week. o Nurse Visit as needed Bathing/ Shower/ Hygiene o May shower with wound dressing protected with water repellent cover or cast protector. o No tub bath. Anesthetic (Use 'Patient Medications' Section for Anesthetic Order Entry) o Lidocaine applied to wound bed Edema Control - Lymphedema / Segmental Compressive Device / Other Bilateral Lower Extremities o Optional: One layer of unna paste to top of compression wrap (to act as an anchor). - she needs this o 3 Layer Compression System for Lymphedema. - left o Elevate, Exercise Daily and Avoid Standing for Long Periods of Time. o Elevate legs to the level of the heart and pump ankles as often as possible o Elevate leg(s) parallel to the floor when sitting. o Compression Pump: Use compression pump on left lower extremity for 60 minutes, twice daily. - You may pump over wraps o Compression Pump: Use compression pump on right lower extremity for 60 minutes, twice daily. - You may pump over wraps Additional Orders / Instructions o Follow Nutritious Diet and Increase Protein Intake Wound Treatment Wound #4 - Lower Leg Wound Laterality:  Right, Lateral Cleanser: Soap and Water 1 x Per Week/30 Days Discharge Instructions: Gently cleanse wound with antibacterial soap, rinse and pat dry prior to dressing wounds Cleanser: Wound Cleanser 1 x Per Week/30 Days Discharge Instructions: Wash your hands with soap and water. Remove old dressing, discard into plastic bag and place into trash. Cleanse the wound with Wound Cleanser prior to applying a clean dressing using gauze sponges, not tissues or cotton balls. Do not scrub or use excessive force. Pat dry using gauze sponges, not tissue or cotton balls. Primary Dressing: Silvercel Small 2x2 (in/in) 1 x Per Week/30 Days Discharge Instructions: Apply Silvercel Small 2x2 (in/in) as  instructed Secondary Dressing: ABD Pad 5x9 (in/in) 1 x Per Week/30 Days Discharge Instructions: Cover with ABD pad Compression Wrap: 3-LAYER WRAP - Profore Lite LF 3 Multilayer Compression Bandaging System 1 x Per Week/30 Days Discharge Instructions: Apply 3 multi-layer wrap as prescribed. Compression Stockings: Circaid Juxta Lite Compression Wrap Right Leg Compression Amount: 20-30 mmHG Discharge Instructions: Apply Circaid Juxta Lite Compression Wrap as directed [name], [name] ([id number]) Electronic Signature(s) Signed: 05/06/2022 4:37:11 PM By: Worthy Keeler PA-C Signed: 05/08/2022 8:44:03 PM By: Carlene Coria RN Entered By: Carlene Coria on 05/06/2022 15:03:25 [name] ([id number]) -------------------------------------------------------------------------------- Problem List Details Patient Name: [name] Date of Service: 05/06/2022 2:00 PM Medical Record Number: [id number] Patient Account Number: [id number] Date of Birth/Sex: Sep 10, 1945 (76 y.o. F) Treating RN: Carlene Coria Primary Care Provider: [name] Other Clinician: Referring Provider: [name] Treating Provider/Extender: Skipper Cliche in Treatment: 67 Active Problems ICD-10 Encounter Code Description Active Date MDM Diagnosis I89.0 Lymphedema, not elsewhere classified 01/16/2021 No Yes I87.332 Chronic venous hypertension (idiopathic) with ulcer and inflammation of 01/16/2021 No Yes left lower extremity L97.812 Non-pressure chronic ulcer of other part of right lower leg with fat layer 01/07/2022 No Yes exposed Inactive Problems ICD-10 Code Description Active Date Inactive Date L97.822 Non-pressure chronic ulcer of other part of left lower leg with fat layer exposed 01/16/2021 01/16/2021 Resolved Problems Electronic Signature(s) Signed: 05/06/2022 1:47:58 PM By: Worthy Keeler PA-C Entered By: Worthy Keeler on 05/06/2022 13:47:58 [name]  ([id number]) -------------------------------------------------------------------------------- Progress Note Details Patient Name: [name] Date of Service: 05/06/2022 2:00 PM Medical Record Number: [id number] Patient Account Number: [id number] Date of Birth/Sex: [birth year] (76 y.o. F) Treating RN: Carlene Coria Primary Care Provider: [name] Other Clinician: Referring Provider: [name] Treating Provider/Extender: Skipper Cliche in Treatment: 67 Subjective Chief Complaint Information obtained from Patient Bilateral LE Ulcers History of Present Illness (HPI) 03/15/2020 upon evaluation today patient presents today for initial evaluation here in our clinic concerning a wound that is on the left posterior lower extremity. Unfortunately this has been giving the patient some discomfort at this point which she notes has been affected her pretty much daily. The patient does have a history of venous insufficiency, hypertension, congestive heart failure, and a history of breast cancer. Fortunately there does not appear to be evidence of active infection at this time which is great news. No fevers, chills, nausea, vomiting, or diarrhea. Wound is not extremely large but does have some slough covering the surface of the wound. This is going require sharp debridement today. 03/15/2020 upon evaluation today patient appears to be doing fairly well in regard to her wound. She does have some slough noted on the surface of the wound currently but I do believe the Iodoflex has been beneficial over the past week. There is no signs of active infection at this time which  is great news and overall very pleased with the progress. No fevers, chills, nausea, vomiting, or diarrhea. 04/02/2020 upon evaluation today patient appears to be doing a little better in regard to her wound size wise this is not tremendously smaller she does have some slough buildup on the surface of the wound. With that being said  this is going require some sharp debridement today to clear away some of the necrotic debris. Fortunately there is no evidence of active infection at this time. No fevers, chills, nausea, vomiting, or diarrhea. 04/10/2020 on evaluation today patient appears to be doing well with regard to her ulcer which is measuring somewhat smaller today. She actually has more granulation tissue noted which is also good news is no need for sharp debridement today. Fortunately there is no evidence of infection either which is also excellent. No fevers, chills, nausea, vomiting, or diarrhea. 04/26/2020 on evaluation today patient's wound actually is showing signs of good improvement which is great news. There does not appear to be any evidence of active infection. I do believe she is tolerating the collagen at this point. 05/03/2020 upon evaluation today patient's wound actually showed signs of good granulation at this time there does not appear to be any evidence of active infection which is great news and overall very pleased with where things stand. With that being said I do believe that the patient is can require some sharp debridement today but fortunately nothing too significant. 05/11/20 on evaluation today patient appears to be doing well in regard to her leg ulcer. She's been tolerating the dressing changes without complication. Fortunately there is no signs of active infection at this time. Overall I'm very pleased with where things stand. 05/18/2020 upon evaluation today patient's wound is showing signs of improvement is very dry and I think the collagen for the most part just dried to the wound bed. I am going to clean this away with sharp debridement in order to get this under control in my opinion. 05/25/2020 upon evaluation today patient actually appears to be doing well in regard to her leg ulcer. In fact upon inspection it appears she could potentially be completely healed but that is not guaranteed based on  what we are seeing. There does not appear to be any signs of active infection which is great news. 05/31/2020 on evaluation today patient appears to be doing well with regard to her wounds. She in fact appears to be almost completely healed on the left leg there is just a very small opening remaining point 1 06/08/2020 upon evaluation today patient appears to be doing excellent in regard to her leg ulcer on the left in fact it appears to be that she is completely healed as of today. I see no signs of active infection at this time which is great news. No fevers, chills, nausea, vomiting, or diarrhea. READMISSION 01/16/2021 Patient that we have had in this clinic with a wound on the left posterior calf in the summer 2021 extending into October. She was felt to have chronic venous insufficiency. Per the patient's description she was discharged with what sounds like external compression stockings although she could not afford the "$75 per leg". She therefore did not get anything. Apparently her wound that she has currently is been in existence since January. She has been followed at vein and vascular with Unna boots and I think calcium alginate. She had ABIs done on 06/25/2020 that showed an noncompressible ABI on the right leg and the left leg  but triphasic waveforms with good great toe pressures and a TBI of 0.90 on the right and 0.91 on the left Surprisingly the patient also had venous reflux studies that were really unremarkable. This included no evidence of DVTs or SVTs but there was no evidence of deep venous insufficiency or superficial venous insufficiency in the greater saphenous or short saphenous veins bilaterally. I would wonder about more central venous issues or perhaps this is all lymphedema 01/25/2021 upon evaluation today patient appears to be doing decently well and guard to her wound. The good news is the Iodoflex that is job she saw Dr. Dellia Nims last week for readmission and to be honest  I think that this has done extremely well over that week. I do believe that she can tolerate the sharp debridement today which will be good. I think that cleaning off the wound will likely allow [place] to be able to use a different type of dressing I do not think the Iodoflex will even be necessary based on how clean the wound looks. Fortunately there is no sign of active infection at this time which is great news. No fevers, chills, nausea, vomiting, or diarrhea. [name], [name] ([id number]) 02/12/2021 upon evaluation today patient appears to be doing well with regard to her leg ulcer. We did switch to alginate when she came for nurse visit I think is doing much better for her. Fortunately there does not appear to be any signs of active infection at this time which is great news. No fevers, chills, nausea, vomiting, or diarrhea. 02/18/2021 upon evaluation today patient's wound is actually showing signs of good improvement. I am very pleased with where things stand currently. I do not see any signs of active infection which is great news and overall I feel like she is making good progress. The patient likewise is happy that things are doing so well and that this is measuring somewhat smaller. 02/25/2021 upon evaluation today patient actually appears to be doing quite well in regard to her wounds. She has been tolerating the dressing changes without complication. Fortunately there does not appear to be any signs of active infection which is great news and overall I am extremely pleased with where things stand. No fevers, chills, nausea, vomiting, or diarrhea. She does have a lot of edema I really do think she needs compression socks to be worn in order to prevent things from causing additional issues for her currently. Especially on the right leg she should be wearing compression socks all the time to be honest. 03/04/2021 upon evaluation today patient's leg ulcer actually appears to be doing pretty well  which is great news. There does not appear to be any significant change but overall the appearance is better even though the size is not necessarily reflecting a great improvement. Fortunately there does not appear to be any signs of active infection. No fevers, chills, nausea, vomiting, or diarrhea. 03/12/2021 upon evaluation today patient appears to be doing well with regard to her wounds. She has been tolerating the dressing changes without complication. The good news is that the wound on her left lateral leg is doing much better and is showing signs of good improvement. Overall her swelling is controlled well as well. She does need some compression socks she plans to go Jefferey socks next week. 03/18/2021 on evaluation today patient's wound actually is showing signs of good improvement. She does have a little bit of slough this can require some sharp debridement today. 03/25/2021 upon evaluation today patient  appears to be doing well with regard to her wound on the left lateral leg. She has been tolerating the dressing changes without complication. Fortunately there does not appear to be any signs of active infection at this time. No fever chills no 04/01/2021 upon evaluation today patient's wound is showing signs of improvement and overall very pleased with where things stand at this point. There is no evidence of active infection which is great news as well and in general I think that she is making great progress. 04/09/2021 upon evaluation today patient appears to be doing well with regard to her ankle ulcer. She is making good progress currently which is great news. There does not appear to be any signs of infection also excellent news. In general I am extremely pleased with where things stand today. No fevers, chills, nausea, vomiting, or diarrhea. 04/23/2021 upon inspection today patient appears to be doing quite well with regard to her leg ulcer. She has been tolerating the dressing  changes without complication. She is can require some sharp debridement today but overall seems to be doing excellent. 05/06/2021 upon evaluation today patient unfortunately appears to be doing poorly overall in regard to her swelling. She is actually significantly more swollen than last week despite the fact that her wound is doing better this is not a good trend. I think that she probably needs to see her cardiologist ASAP and I discussed that with her today. This includes the fact that honestly I think she probably is getting need an increase in her diuretic or something to try to clear away some of the excess fluid that she is experiencing at the moment. She is not been doing her pumps even twice a day but again right now more concerned that if she were to do the pump she would fluid overload her lungs causing other issues as far as her breathing is concerned. Obviously I think she needs to contact them and move up the appointment for the 12th of something sooner 2 weeks is a bit too long to wait with how swollen she has. 05/14/2021 upon evaluation today patient's wound actually showing signs of being a little bit smaller compared to previous. She has been making good progress however. Fortunately there does not appear to be any evidence of active infection which is great. Overall I am extremely pleased in that regard. Nonetheless I am still concerned that she is having quite a bit of issue as far as the swelling is concerned she has been placed on fluid restriction as well as an increase in the furosemide by her doctor. We will see how things progress. 05/21/2021 upon evaluation today patient appears to be doing well with regard to her wound in general. She has been tolerating the dressing changes without complication. With that being said she has been using silver cell for a while and while this was showing signs of improvement it is now gotten to the point where this has slowed down quite  significantly. I do believe that switching to a different dressing may be beneficial for her today. 05/28/2021 upon evaluation today patient actually appears to be doing quite well in regard to her wound. In fact there is really not any need for sharp debridement today based on what I see. The Hydrofera Blue is doing great and overall I think that she is managing quite nicely with the compression wrapping. Her edema is still down quite a bit with the wrapping in place. 06/11/2021 upon evaluation today patient  appears to be doing well with regard to her leg ulcer. She has been tolerating the dressing changes without complication. Fortunately there does not appear to be any signs of active infection at this time which is great news. No fevers, chills, nausea, vomiting, or diarrhea. She is going require some sharp debridement today. 10/11; left leg ulcer much better looking and with improved surface area. She is using Hydrofera Blue under 3 layer compression. She does not have any stocking on the right leg which in itself has very significant nonpitting edema 06/25/2021 upon evaluation today patient appears to be doing well with regard to the wound on her left leg. She has been tolerating the dressing changes without complication and overall I am extremely pleased with where things stand today. I do not see any evidence of infection which is great news. 07/02/2021 upon evaluation today patient's wound actually showing signs of excellent improvement and actually very pleased with where we stand today and the overall appearance. Fortunately there does not appear to be any signs of active infection. No fevers, chills, nausea, vomiting, or diarrhea. 07/09/2021 upon evaluation today patient appears to be doing better in regard to her wound this is measuring smaller and she is headed in the appropriate direction based on what I am seeing currently. I do not see any evidence of active infection systemically  which is great news. 07/16/2021 upon evaluation today patient appears to be doing pretty well currently in regard to her leg ulcer. This is measuring smaller and looking much better this is great news. Fortunately there does not appear to be any evidence of active infection systemically which is great news [name], [name] ([id number]) as well. 07/23/2021 upon evaluation today patient's wound is actually showing signs of good improvement this is definitely measuring smaller. I do not see any signs of infection which is great news and overall very pleased in that regard. Overall I think that she is doing well with the [facility]. 07/30/2020 upon evaluation today patient appears to be doing well in regard to her wound. She has been tolerating the dressing changes without complication think the Hydrofera Blue at this point is actually causing her to dry out a lot as far as the wound bed is concerned. Subsequently I think that we need to try to see what we can do to improve this. Fortunately there does not appear to be any evidence of active infection at this time which is great news. 08/06/2021 upon evaluation today patient appears to be doing well with regards to her wound. Is not measuring significantly smaller initial inspection but upon closer inspection she has a lot of new skin growth across the central portion of the wound. I think will get very close to complete resolution. 08/13/2021 upon evaluation today patient appears to be doing well with regard to her wound. In fact this appears to be I believe healed although there is still some question as to whether it is completely so. I am concerned about the fact that to be honest she still has some evidence of dry skin that could be just trapping something underneath I still want to debride anything as I am afraid of causing some damage to the good skin for that reason I Georgina Peer probably monitor for 1 more week before completely closing everything  out. 08/20/2021 upon evaluation today patient actually appears to be doing excellent. In fact she appears to be completely healed. This was the case last week as well we just wanted to  make sure nothing reopened since she does not really have an ability to put on compression socks this is good to be something we need to make sure was well-healed. Nonetheless I think we have achieved that goal as of today. 12/27; this is a patient we discharged 2 weeks ago with wounds on her left lower leg laterally. She was supposed to get stockings and really did not get them. She developed blistering and reopening probably sometime late last week. She has 3 areas in the same area as previously. The mid calf dimensions on the left went from 39 to 51 cm today. She also has very significant edema on the right leg but as far as I am aware has not had wounds in this area 1/4; patient presents for follow-up. She has tolerated the compression wrap well. She has no issues or complaints today. 09/17/2021 upon evaluation today patient appears to be doing well with regard to her wounds. She has been tolerating the dressing changes without complication. Fortunately there does not appear to be any signs of active infection at this time. No fevers, chills, nausea, vomiting, or diarrhea. 10/01/2021 upon evaluation today patient appears to be doing well with regard to her wound this is actually measuring better and looking smaller and I am very pleased in that regard. Fortunately I do not see any evidence of active infection locally nor systemically at this point. No fevers, chills, nausea, vomiting, or diarrhea. 10/08/2021 upon evaluation patient's wounds are actually showing some signs of improvement measuring a little bit smaller and looking a little bit better. Overall I do not think there is any need for sharp debridement today which is good news. No fevers, chills, nausea, vomiting, or diarrhea. 10/14/2021 upon evaluation today  patient appears to be doing well with regard to her wound. She has been tolerating the dressing changes without complication and overall I am extremely pleased with where things stand today. There is a little bit of film on the surface of the wounds but this was carefully cleaned away with just saline and gauze she really did not want me to do any sharp debridement today. 10/29/2021 upon evaluation today patient appears to be doing quite well in regard to her wounds. She is actually showing signs of excellent improvement which is great news and overall I feel like we are on the right track at this time. She does have a wound on both legs and she does need something compression wise when she heals for that reason we will get a go and see about ordering her bilateral juxta lite compression wraps. 11/05/2021 upon evaluation today patient appears to be doing well with regard to her wound. She has been tolerating the dressing changes without complication. Fortunately I do not see any evidence of active infection locally nor systemically at this time which is great news. No fevers, chills, nausea, vomiting, or diarrhea. 11/12/2021 upon evaluation today patient appears to be doing well with regard to her wounds. She is doing excellent on the left on the right this is not doing quite as well there is some slough and biofilm buildup. 3/14; patient presents for follow-up. She has no issues or complaints today. She has tolerated the compression wraps well. 11/26/2021 upon evaluation today patient appears to be doing well with regard to her wounds. Both are showing signs of good improvement which is great news. I do not see any evidence of active infection and overall think she is healing quite nicely. 12/03/2021 upon evaluation  today patient appears to be doing well with regard to her wounds. Both are showing signs of excellent improvement I am very pleased with where things stand and I think that she is making good  progress here. Fortunately I do not see any signs of active infection locally or systemically which is great news. No fevers, chills, nausea, vomiting, or diarrhea. 12-10-2021 upon evaluation today patient's wounds actually appear to be doing awesome. I am extremely pleased with where we stand I think she is making excellent progress. I do not see any signs of active infection locally or systemically at this time. 12-17-2021 upon evaluation today patient's wounds are actually showing signs of good improvement. Fortunately I do not see any evidence of active infection locally nor systemically which is great news. Overall I do not believe that she is showing any signs of significant worsening which is good news. With that being said I do believe that she has a little bit of film on the surface of the wound I was actually able to clear this away with saline and gauze no sharp debridement was necessary. 12-24-2021 upon evaluation today patient appears to be doing well with regard to her wounds. I do not see any signs of active infection currently which is great news and overall I think that we are on the right track here. No fevers, chills, nausea, vomiting, or diarrhea. 12-31-2021 upon evaluation today patient appears to be doing well with regard to her wounds. Both are showing signs of improvement the wound [name], [name] ([id number]) on the right leg is going require some sharp debridement on the left seems to be doing quite well. 01-07-2022 upon evaluation today patient appears to be doing well currently in regard to her wounds. She is actually making good progress bilaterally. Both of them require little bit of sharp debridement but not too much which is great news. Upon inspection patient's wound bed actually showed signs of good granulation and epithelization on both legs. She still is having significant issues with the right leg compared to the left although I do feel like both are actually showing  signs of pretty good improvement. 01-21-2022 upon evaluation today patient's wound is actually showing signs of excellent improvement I am very pleased with where we stand currently. I do not see any signs of active infection locally or systemically which is great news. No fevers, chills, nausea, vomiting, or diarrhea. 01-28-2022 upon evaluation today patient appears to be doing well with regard to her wound on the left leg this is pretty much just about healed which is great news. Unfortunately in regard to the wound on her right leg this is not doing nearly as well in fact I think we probably need to see about a culture today that was discussed with her. 02-04-2022 upon evaluation today patient appears to be doing well with regard to her left leg which in fact might actually be completely healed I am not 100% sure. Nonetheless this is shown signs of excellent improvement which is great news. With that being said in regards to the right leg there is some slough and film buildup on the surface of the wound although she is feeling much better than last week this does appear to be doing much better than it was last week. Fortunately there does not appear to be any signs of active infection locally or systemically at this time. 02-11-2022 upon evaluation today patient's wounds appear to be doing decently well. In fact the left leg is  healed although there is brand-new skin were definitely given still need to wrap her for the time being. On the right leg this is showing signs of some epithelial growth in the middle of the wound it still measures the same because there is still a larger area with speckled openings but again as far as the entire area being open this is significantly improved which is great news. I am very pleased. 6/13; left leg remains healed and she has a juxta light to apply to it today. There is some concern about whether she is going to be able to do this at home. She lives with a daughter  although she has her own disability. On the right the wound looks smaller to me nice epithelialization good edema control. 02-25-2022 upon evaluation today patient appears to be doing well with regard to her legs the left leg is still healed the right leg is doing much better. I think we are on the right track here. With that being said unfortunately she continues to have significant issues with some other problems and she actually tells me on Saturday she was having lunch and went inside to sit down when she tells me she had all of a sudden a sharp sensation that went from her hand through her arm into her leg and her neck and she tells me that she could not see anything for the next hour it was completely black. Slowly after this her vision started to return and has been normal since. It sounds to me as if she may have had a TIA or mini stroke at this point. Nonetheless I think that she needs to have this checked out ASAP. Her primary care provider is Dr. Clide Deutscher at [facility]. We will get a try to get in touch with him. 03-10-2022 upon evaluation today patient's wound actually showed signs of good improvement. She seems to be making excellent progress here. I do not see any evidence of active infection locally or systemically which is great news. No fevers, chills, nausea, vomiting, or diarrhea. She was in the hospital since have seen her last she ended up finding out that she was having syncopal episodes. That is what was going on with the blacking out and losing vision. Subsequently they adjusted some of her medication she seems to be doing better. 03-18-2022 upon evaluation today patient's wound is actually showing signs of good granulation and epithelization at this point. Fortunately I do not see any signs of active infection locally or systemically which is great news. 03-25-2022 any evidence of active infection locally or systemically which is great news. Any evidence of active infection which  is great news and overall the wound is looking better. Upon evaluation today patient appears to be doing well with regard to her leg ulcer. She has been tolerating the dressing changes without complication. Fortunately I do not see 7/25; the patient's wound is on the right lateral lower extremity using silver alginate 3 layer compression. She has chronic venous insufficiency and lymphedema. We are using a juxta lite on the left leg which does not have any open wounds but she is leaving this on all week it seems that there just is not a way to get this changed at home 04-08-2022 upon evaluation today patient appears to be doing well currently in regard to her wound. She has been tolerating the dressing changes without complication. Fortunately I do not see any evidence of active infection locally or systemically which is great news.  No fever or chills noted her left leg has reopened at this point. 04-15-2022 upon evaluation patient's wounds actually are showing signs of improvement which is great news. Little by little I do believe her making progress here. In regard to the right leg this is looking better though the measurement does not speak to it there is a lot of new skin growth. On the left leg this is significantly smaller compared to last week most of the blistered area has completely closed. 04-22-2022 upon evaluation today patient appears to be doing excellent in regard to her wounds both are showing signs of improvement. Her knees and above are still extremely swollen the legs were rewrapped them are down significantly. With that being said I do not think that we will get a be able to keep them that way once were not rapid noted that she really has no way to be able to put juxta lites on and off and nobody to help her with that. That is the biggest concern that we have here at this point. Fortunately I do not think that there is any evidence of infection which is great news. 04-29-2022 upon  evaluation today patient appears to be doing well with regard to her wounds. She has been tolerating the dressing changes without complication. Fortunately there does not appear to be any evidence of infection locally or systemically which is great news and overall I am extremely pleased in that regard. Left leg is healed the right leg is very close we discussed today the possibility of her granddaughter helping with putting on and off compression wraps for her. 05-06-2022 upon evaluation today patient appears to be doing well currently in regard to her left leg which is showing signs of being completely healed. With regard to her right leg she still has an open area though this is doing much better there is a lot less drainage than what we previously noted. Fortunately I see no evidence of active infection locally or systemically at this time which is great news. [name], [name] ([id number]) Objective Constitutional Well-nourished and well-hydrated in no acute distress. Vitals Time Taken: 1:49 PM, Height: 69 in, Weight: 244 lbs, BMI: 36, Temperature: 98.6 F, Pulse: 68 bpm, Respiratory Rate: 18 breaths/min, Blood Pressure: 188/90 mmHg. Respiratory normal breathing without difficulty. Psychiatric this patient is able to make decisions and demonstrates good insight into disease process. Alert and Oriented x 3. pleasant and cooperative. General Notes: Upon inspection patient's wound bed actually showed signs of good granulation and epithelization at this point. Fortunately I do not see any evidence of active infection which is great news and overall I do believe that the patient is headed in the right direction based on what I am seeing. The left leg again is healed I would like to see if her granddaughter come in so we can show her how to put the Velcro wrap on since she may be helping her take this on and off. Also would like to have the patient continue with the compression wraps for the time  being until we can try to get this set up. Integumentary (Hair, Skin) Wound #4 status is Open. Original cause of wound was Gradually Appeared. The date acquired was: 10/27/2021. The wound has been in treatment 27 weeks. The wound is located on the Right,Lateral Lower Leg. The wound measures 5cm length x 4.5cm width x 0.1cm depth; 17.671cm^2 area and 1.767cm^3 volume. There is Fat Layer (Subcutaneous Tissue) exposed. There is no tunneling or undermining noted.  There is a medium amount of serosanguineous drainage noted. There is medium (34-66%) pink granulation within the wound bed. There is a medium (34-66%) amount of necrotic tissue within the wound bed including Adherent Slough. 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 Would recommend currently that we going continue with the wound care measures as before and the patient is in agreement with plan. This includes the use of the silver alginate dressing which I do believe is doing quite well. 2. I am also can recommend that we have the patient continue to use the 3 layer compression wraps bilaterally this is keeping the edema under good control. 3. I am also can recommend patient continue to monitor for any signs of worsening or infection if anything changes she should contact the office and let me know. We will see patient back for reevaluation in 1 week here in the clinic. If anything worsens or changes patient will contact our office for additional recommendations. Electronic Signature(s) Signed: 05/06/2022 2:45:01 PM By: Worthy Keeler PA-C Entered By: Worthy Keeler on 05/06/2022 14:45:01 [name] ([id number]) -------------------------------------------------------------------------------- SuperBill Details Patient  Name: [name] Date of Service: 05/06/2022 Medical Record Number: [id number] Patient Account Number: [id number] Date of Birth/Sex: [birth year] (76 y.o. F) Treating RN: Carlene Coria Primary Care Provider: [name] Other Clinician: Referring Provider: [name] Treating Provider/Extender: Skipper Cliche in Treatment: 67 Diagnosis Coding ICD-10 Codes Code Description I89.0 Lymphedema, not elsewhere classified I87.332 Chronic venous hypertension (idiopathic) with ulcer and inflammation of left lower extremity L97.812 Non-pressure chronic ulcer of other part of right lower leg with fat layer exposed Facility Procedures CPT4: Description Modifier Quantity Code [id number] BILATERAL: Application of multi-layer venous compression system; leg (below knee), including 1 ankle and foot. Physician Procedures CPT4 Code Description: [id number] - WC PHYS LEVEL 3 - EST PT Modifier: Quantity: 1 CPT4 Code Description: ICD-10 Diagnosis Description I89.0 Lymphedema, not elsewhere classified I87.332 Chronic venous hypertension (idiopathic) with ulcer and inflammation of L97.812 Non-pressure chronic ulcer of other part of right lower leg with fat  lay Modifier: left lower extremity er exposed Quantity: Electronic Signature(s) Signed: 05/06/2022 4:13:14 PM By: Carlene Coria RN Signed: 05/06/2022 4:37:11 PM By: Worthy Keeler PA-C Previous Signature: 05/06/2022 2:45:56 PM Version By: Worthy Keeler PA-C Entered By: Carlene Coria on 05/06/2022 16:13:14

## 2022-05-07 ENCOUNTER — Other Ambulatory Visit: Payer: Self-pay | Admitting: Family Medicine

## 2022-05-07 DIAGNOSIS — R1084 Generalized abdominal pain: Secondary | ICD-10-CM

## 2022-05-08 ENCOUNTER — Ambulatory Visit: Payer: Medicare Other | Admitting: Podiatry

## 2022-05-14 ENCOUNTER — Encounter: Payer: Medicare Other | Attending: Internal Medicine

## 2022-05-14 DIAGNOSIS — I87332 Chronic venous hypertension (idiopathic) with ulcer and inflammation of left lower extremity: Secondary | ICD-10-CM | POA: Insufficient documentation

## 2022-05-14 DIAGNOSIS — I509 Heart failure, unspecified: Secondary | ICD-10-CM | POA: Diagnosis not present

## 2022-05-14 DIAGNOSIS — M1990 Unspecified osteoarthritis, unspecified site: Secondary | ICD-10-CM | POA: Diagnosis not present

## 2022-05-14 DIAGNOSIS — L97812 Non-pressure chronic ulcer of other part of right lower leg with fat layer exposed: Secondary | ICD-10-CM | POA: Diagnosis not present

## 2022-05-14 DIAGNOSIS — I110 Hypertensive heart disease with heart failure: Secondary | ICD-10-CM | POA: Insufficient documentation

## 2022-05-14 DIAGNOSIS — I890 Lymphedema, not elsewhere classified: Secondary | ICD-10-CM | POA: Insufficient documentation

## 2022-05-14 NOTE — Progress Notes (Signed)
[name], [name] ([id number]) Visit Report for 05/14/2022 Physician Orders Details Patient Name: [name], [name] Date of Service: 05/14/2022 11:15 AM Medical Record Number: [id number] Patient Account Number: [id number] Date of Birth/Sex: 1946-01-14 (76 y.o. F) Treating RN: Carlene Coria Primary Care Provider: [name] Other Clinician: Massie Kluver Referring Provider: [name] Treating Provider/Extender: Yaakov Guthrie in Treatment: 20 Verbal / Phone Orders: No Diagnosis Coding Follow-up Appointments o Return Appointment in 1 week. o Nurse Visit as needed Bathing/ Shower/ Hygiene o May shower with wound dressing protected with water repellent cover or cast protector. o No tub bath. Anesthetic (Use 'Patient Medications' Section for Anesthetic Order Entry) o Lidocaine applied to wound bed Edema Control - Lymphedema / Segmental Compressive Device / Other Bilateral Lower Extremities o Optional: One layer of unna paste to top of compression wrap (to act as an anchor). - she needs this o 3 Layer Compression System for Lymphedema. - left o Elevate, Exercise Daily and Avoid Standing for Long Periods of Time. o Elevate legs to the level of the heart and pump ankles as often as possible o Elevate leg(s) parallel to the floor when sitting. o Compression Pump: Use compression pump on left lower extremity for 60 minutes, twice daily. - You may pump over wraps o Compression Pump: Use compression pump on right lower extremity for 60 minutes, twice daily. - You may pump over wraps Additional Orders / Instructions o Follow Nutritious Diet and Increase Protein Intake Wound Treatment Wound #4 - Lower Leg Wound Laterality: Right, Lateral Cleanser: Soap and Water 1 x Per Week/30 Days Discharge Instructions: Gently cleanse wound with antibacterial soap, rinse and pat dry prior to dressing wounds Cleanser: Wound Cleanser 1 x Per Week/30 Days Discharge  Instructions: Wash your hands with soap and water. Remove old dressing, discard into plastic bag and place into trash. Cleanse the wound with Wound Cleanser prior to applying a clean dressing using gauze sponges, not tissues or cotton balls. Do not scrub or use excessive force. Pat dry using gauze sponges, not tissue or cotton balls. Primary Dressing: Silvercel Small 2x2 (in/in) 1 x Per Week/30 Days Discharge Instructions: Apply Silvercel Small 2x2 (in/in) as instructed Secondary Dressing: ABD Pad 5x9 (in/in) 1 x Per Week/30 Days Discharge Instructions: Cover with ABD pad Compression Wrap: 3-LAYER WRAP - Profore Lite LF 3 Multilayer Compression Bandaging System 1 x Per Week/30 Days Discharge Instructions: Apply 3 multi-layer wrap as prescribed. Compression Stockings: Circaid Juxta Lite Compression Wrap Right Leg Compression Amount: 20-30 mmHG Discharge Instructions: Apply Circaid Juxta Lite Compression Wrap as directed Electronic Signature(s) Signed: 05/14/2022 1:10:33 PM By: Kalman Shan DO Signed: 05/14/2022 5:12:58 PM By: Arthor Captain, Armina ([id number]) Entered By: Massie Kluver on 05/14/2022 12:58:21 [name] ([id number]) -------------------------------------------------------------------------------- SuperBill Details Patient Name: [name] Date of Service: 05/14/2022 Medical Record Number: [id number] Patient Account Number: [id number] Date of Birth/Sex: Apr 24, 1946 (76 y.o. F) Treating RN: Carlene Coria Primary Care Provider: [name] Other Clinician: Massie Kluver Referring Provider: [name] Treating Provider/Extender: Yaakov Guthrie in Treatment: 69 Diagnosis Coding ICD-10 Codes Code Description I89.0 Lymphedema, not elsewhere classified I87.332 Chronic venous hypertension (idiopathic) with ulcer and inflammation of left lower extremity L97.812 Non-pressure chronic ulcer of other part of right lower leg with fat layer  exposed Facility Procedures CPT4 Code: [id number] Description: (Facility Use Only) [place] RT LEG Modifier: Quantity: 1 Electronic Signature(s) Signed: 05/14/2022 1:10:33 PM By: Kalman Shan DO Signed: 05/14/2022 5:12:58 PM By: Massie Kluver Entered By: Massie Kluver  on 05/14/2022 12:58:58

## 2022-05-19 NOTE — Progress Notes (Signed)
ADIAH, GUERECA ([id number]) Visit Report for 05/14/2022 Arrival Information Details Patient Name: [name], [name] Date of Service: 05/14/2022 11:15 AM Medical Record Number: [id number] Patient Account Number: [id number] Date of Birth/Sex: [birth year] (76 y.o. F) Treating RN: Carlene Coria Primary Care Wyat Infinger: [name] Other Clinician: Massie Kluver Referring Kaylynne Andres: [name] Treating Ioana Louks/Extender: Yaakov Guthrie in Treatment: 7 Visit Information History Since Last Visit All ordered tests and consults were completed: No Patient Arrived: Wheel Chair Added or deleted any medications: No Arrival Time: 12:56 Any new allergies or adverse reactions: No Transfer Assistance: EasyPivot Patient Lift Had a fall or experienced change in No activities of daily living that may affect Patient Requires Transmission-Based No risk of falls: Precautions: Hospitalized since last visit: No Patient Has Alerts: Yes Pain Present Now: No Patient Alerts: NOT DIABETIC Electronic Signature(s) Signed: 05/14/2022 5:12:58 PM By: Massie Kluver Entered By: Massie Kluver on 05/14/2022 12:56:47 [name] ([id number]) -------------------------------------------------------------------------------- Clinic Level of Care Assessment Details Patient Name: [name] Date of Service: 05/14/2022 11:15 AM Medical Record Number: [id number] Patient Account Number: [id number] Date of Birth/Sex: Mar 09, 1946 (76 y.o. F) Treating RN: Carlene Coria Primary Care Lawerence Dery: [name] Other Clinician: Massie Kluver Referring Kenetha Cozza: [name] Treating Delmont Prosch/Extender: Yaakov Guthrie in Treatment: 31 Clinic Level of Care Assessment Items TOOL 1 Quantity Score '[]'$  - Use when EandM and Procedure is performed on INITIAL visit 0 ASSESSMENTS - Nursing Assessment / Reassessment '[]'$  - General Physical Exam (combine w/ comprehensive assessment (listed just below) when performed on  new 0 pt. evals) '[]'$  - 0 Comprehensive Assessment (HX, ROS, Risk Assessments, Wounds Hx, etc.) ASSESSMENTS - Wound and Skin Assessment / Reassessment '[]'$  - Dermatologic / Skin Assessment (not related to wound area) 0 ASSESSMENTS - Ostomy and/or Continence Assessment and Care '[]'$  - Incontinence Assessment and Management 0 '[]'$  - 0 Ostomy Care Assessment and Management (repouching, etc.) PROCESS - Coordination of Care '[]'$  - Simple Patient / Family Education for ongoing care 0 '[]'$  - 0 Complex (extensive) Patient / Family Education for ongoing care '[]'$  - 0 Staff obtains Programmer, systems, Records, Test Results / Process Orders '[]'$  - 0 Staff telephones HHA, Nursing Homes / Clarify orders / etc '[]'$  - 0 Routine Transfer to another Facility (non-emergent condition) '[]'$  - 0 Routine Hospital Admission (non-emergent condition) '[]'$  - 0 New Admissions / Biomedical engineer / Ordering NPWT, Apligraf, etc. '[]'$  - 0 Emergency Hospital Admission (emergent condition) PROCESS - Special Needs '[]'$  - Pediatric / Minor Patient Management 0 '[]'$  - 0 Isolation Patient Management '[]'$  - 0 Hearing / Language / Visual special needs '[]'$  - 0 Assessment of Community assistance (transportation, D/C planning, etc.) '[]'$  - 0 Additional assistance / Altered mentation '[]'$  - 0 Support Surface(s) Assessment (bed, cushion, seat, etc.) INTERVENTIONS - Miscellaneous '[]'$  - External ear exam 0 '[]'$  - 0 Patient Transfer (multiple staff / Civil Service fast streamer / Similar devices) '[]'$  - 0 Simple Staple / Suture removal (25 or less) '[]'$  - 0 Complex Staple / Suture removal (26 or more) '[]'$  - 0 Hypo/Hyperglycemic Management (do not check if billed separately) '[]'$  - 0 Ankle / Brachial Index (ABI) - do not check if billed separately Has the patient been seen at the hospital within the last three years: Yes Total Score: 0 Level Of Care: ____ [name] ([id number]) Electronic Signature(s) Signed: 05/14/2022 5:12:58 PM By: Massie Kluver Entered By:  Massie Kluver on 05/14/2022 12:58:48 [name] ([id number]) -------------------------------------------------------------------------------- Compression Therapy Details Patient Name: [name] Date of Service: 05/14/2022 11:15 AM Medical Record  Number: [id number] Patient Account Number: [id number] Date of Birth/Sex: 03-28-1946 (76 y.o. F) Treating RN: Carlene Coria Primary Care Tzippy Testerman: [name] Other Clinician: Massie Kluver Referring Jereline Ticer: [name] Treating Laressa Bolinger/Extender: Yaakov Guthrie in Treatment: 69 Compression Therapy Performed for Wound Assessment: Wound #4 Right,Lateral Lower Leg Performed By: Clinician Carlene Coria, RN Compression Type: Three Layer Notes noncompressible. Patient tolerates wrap well Electronic Signature(s) Signed: 05/14/2022 5:12:58 PM By: Massie Kluver Entered By: Massie Kluver on 05/14/2022 12:58:06 [name] ([id number]) -------------------------------------------------------------------------------- Encounter Discharge Information Details Patient Name: [name] Date of Service: 05/14/2022 11:15 AM Medical Record Number: [id number] Patient Account Number: [id number] Date of Birth/Sex: Nov 18, 1945 (76 y.o. F) Treating RN: Carlene Coria Primary Care Ovid Witman: [name] Other Clinician: Massie Kluver Referring Shearon Clonch: [name] Treating Muhammed Teutsch/Extender: Yaakov Guthrie in Treatment: 53 Encounter Discharge Information Items Discharge Condition: Stable Ambulatory Status: Wheelchair Discharge Destination: Home Transportation: Other Accompanied By: self Schedule Follow-up Appointment: Yes Clinical Summary of Care: Electronic Signature(s) Signed: 05/14/2022 5:12:58 PM By: Massie Kluver Entered By: Massie Kluver on 05/14/2022 12:58:42 [name] ([id number]) -------------------------------------------------------------------------------- Wound Assessment Details Patient Name:  [name] Date of Service: 05/14/2022 11:15 AM Medical Record Number: [id number] Patient Account Number: [id number] Date of Birth/Sex: Aug 16, 1946 (76 y.o. F) Treating RN: Carlene Coria Primary Care Charmine Bockrath: [name] Other Clinician: Massie Kluver Referring Daril Warga: [name] Treating Teshara Moree/Extender: Yaakov Guthrie in Treatment: 69 Wound Status Wound Number: 4 Primary Venous Leg Ulcer Etiology: Wound Location: Right, Lateral Lower Leg Wound Open Wounding Event: Gradually Appeared Status: Date Acquired: 10/27/2021 Comorbid Cataracts, Lymphedema, Sleep Apnea, Congestive Heart Weeks Of Treatment: 28 History: Failure, Hypertension, Osteoarthritis, Neuropathy, Clustered Wound: No Received Chemotherapy, Received Radiation Wound Measurements Length: (cm) 5 Width: (cm) 4.5 Depth: (cm) 0.1 Area: (cm) 17.671 Volume: (cm) 1.767 % Reduction in Area: -1400.1% % Reduction in Volume: -1397.5% Epithelialization: None Wound Description Classification: Full Thickness Without Exposed Support Structu Exudate Amount: Medium Exudate Type: Serosanguineous Exudate Color: red, brown res Foul Odor After Cleansing: No Slough/Fibrino Yes Wound Bed Granulation Amount: Medium (34-66%) Exposed Structure Granulation Quality: Pink Fascia Exposed: No Necrotic Amount: Medium (34-66%) Fat Layer (Subcutaneous Tissue) Exposed: Yes Necrotic Quality: Adherent Slough Tendon Exposed: No Muscle Exposed: No Joint Exposed: No Bone Exposed: No Treatment Notes Wound #4 (Lower Leg) Wound Laterality: Right, Lateral Cleanser Soap and Water Discharge Instruction: Gently cleanse wound with antibacterial soap, rinse and pat dry prior to dressing wounds Wound Cleanser Discharge Instruction: Wash your hands with soap and water. Remove old dressing, discard into plastic bag and place into trash. Cleanse the wound with Wound Cleanser prior to applying a clean dressing using gauze sponges, not  tissues or cotton balls. Do not scrub or use excessive force. Pat dry using gauze sponges, not tissue or cotton balls. Peri-Wound Care Topical Primary Dressing Silvercel Small 2x2 (in/in) Discharge Instruction: Apply Silvercel Small 2x2 (in/in) as instructed Secondary Dressing ABD Pad 5x9 (in/in) Discharge Instruction: Cover with ABD pad [name], [name] ([id number]) Secured With Compression Wrap 3-LAYER WRAP - Profore Lite LF 3 Multilayer Compression Bandaging System Discharge Instruction: Apply 3 multi-layer wrap as prescribed. Compression Stockings Circaid Juxta Lite Compression Wrap Quantity: 1 Right Leg Compression Amount: 20-30 mmHg Discharge Instruction: Apply Circaid Juxta Lite Compression Wrap as directed Add-Ons Electronic Signature(s) Signed: 05/14/2022 5:12:58 PM By: Massie Kluver Signed: 05/19/2022 8:13:04 AM By: Carlene Coria RN Entered By: Massie Kluver on 05/14/2022 12:57:02

## 2022-05-20 ENCOUNTER — Encounter: Payer: Medicare Other | Admitting: Physician Assistant

## 2022-05-20 DIAGNOSIS — L97812 Non-pressure chronic ulcer of other part of right lower leg with fat layer exposed: Secondary | ICD-10-CM | POA: Diagnosis not present

## 2022-05-20 NOTE — Progress Notes (Signed)
EMALEE, KNIES ([id number]) Visit Report for 05/20/2022 Chief Complaint Document Details Patient Name: [name], [name] Date of Service: 05/20/2022 2:00 PM Medical Record Number: [id number] Patient Account Number: [id number] Date of Birth/Sex: September 10, 1945 (76 y.o. F) Treating RN: Carlene Coria Primary Care Provider: [name] Other Clinician: Referring Provider: [name] Treating Provider/Extender: Skipper Cliche in Treatment: 71 Information Obtained from: Patient Chief Complaint Bilateral LE Ulcers Electronic Signature(s) Signed: 05/20/2022 2:34:54 PM By: Worthy Keeler PA-C Entered By: Worthy Keeler on 05/20/2022 14:34:54 [name] ([id number]) -------------------------------------------------------------------------------- HPI Details Patient Name: [name] Date of Service: 05/20/2022 2:00 PM Medical Record Number: [id number] Patient Account Number: [id number] Date of Birth/Sex: [birth year] (76 y.o. F) Treating RN: Carlene Coria Primary Care Provider: [name] Other Clinician: Referring Provider: [name] Treating Provider/Extender: Skipper Cliche in Treatment: 69 History of Present Illness HPI Description: 03/15/2020 upon evaluation today patient presents today for initial evaluation here in our clinic concerning a wound that is on the left posterior lower extremity. Unfortunately this has been giving the patient some discomfort at this point which she notes has been affected her pretty much daily. The patient does have a history of venous insufficiency, hypertension, congestive heart failure, and a history of breast cancer. Fortunately there does not appear to be evidence of active infection at this time which is great news. No fevers, chills, nausea, vomiting, or diarrhea. Wound is not extremely large but does have some slough covering the surface of the wound. This is going require sharp debridement today. 03/15/2020 upon evaluation today patient  appears to be doing fairly well in regard to her wound. She does have some slough noted on the surface of the wound currently but I do believe the Iodoflex has been beneficial over the past week. There is no signs of active infection at this time which is great news and overall very pleased with the progress. No fevers, chills, nausea, vomiting, or diarrhea. 04/02/2020 upon evaluation today patient appears to be doing a little better in regard to her wound size wise this is not tremendously smaller she does have some slough buildup on the surface of the wound. With that being said this is going require some sharp debridement today to clear away some of the necrotic debris. Fortunately there is no evidence of active infection at this time. No fevers, chills, nausea, vomiting, or diarrhea. 04/10/2020 on evaluation today patient appears to be doing well with regard to her ulcer which is measuring somewhat smaller today. She actually has more granulation tissue noted which is also good news is no need for sharp debridement today. Fortunately there is no evidence of infection either which is also excellent. No fevers, chills, nausea, vomiting, or diarrhea. 04/26/2020 on evaluation today patient's wound actually is showing signs of good improvement which is great news. There does not appear to be any evidence of active infection. I do believe she is tolerating the collagen at this point. 05/03/2020 upon evaluation today patient's wound actually showed signs of good granulation at this time there does not appear to be any evidence of active infection which is great news and overall very pleased with where things stand. With that being said I do believe that the patient is can require some sharp debridement today but fortunately nothing too significant. 05/11/20 on evaluation today patient appears to be doing well in regard to her leg ulcer. She's been tolerating the dressing changes without complication.  Fortunately there is no signs of active infection at this time.  Overall I'm very pleased with where things stand. 05/18/2020 upon evaluation today patient's wound is showing signs of improvement is very dry and I think the collagen for the most part just dried to the wound bed. I am going to clean this away with sharp debridement in order to get this under control in my opinion. 05/25/2020 upon evaluation today patient actually appears to be doing well in regard to her leg ulcer. In fact upon inspection it appears she could potentially be completely healed but that is not guaranteed based on what we are seeing. There does not appear to be any signs of active infection which is great news. 05/31/2020 on evaluation today patient appears to be doing well with regard to her wounds. She in fact appears to be almost completely healed on the left leg there is just a very small opening remaining point 1 06/08/2020 upon evaluation today patient appears to be doing excellent in regard to her leg ulcer on the left in fact it appears to be that she is completely healed as of today. I see no signs of active infection at this time which is great news. No fevers, chills, nausea, vomiting, or diarrhea. READMISSION 01/16/2021 Patient that we have had in this clinic with a wound on the left posterior calf in the summer 2021 extending into October. She was felt to have chronic venous insufficiency. Per the patient's description she was discharged with what sounds like external compression stockings although she could not afford the "$75 per leg". She therefore did not get anything. Apparently her wound that she has currently is been in existence since January. She has been followed at vein and vascular with Unna boots and I think calcium alginate. She had ABIs done on 06/25/2020 that showed an noncompressible ABI on the right leg and the left leg but triphasic waveforms with good great toe pressures and a TBI of 0.90 on the  right and 0.91 on the left Surprisingly the patient also had venous reflux studies that were really unremarkable. This included no evidence of DVTs or SVTs but there was no evidence of deep venous insufficiency or superficial venous insufficiency in the greater saphenous or short saphenous veins bilaterally. I would wonder about more central venous issues or perhaps this is all lymphedema 01/25/2021 upon evaluation today patient appears to be doing decently well and guard to her wound. The good news is the Iodoflex that is job she saw Dr. Dellia Nims last week for readmission and to be honest I think that this has done extremely well over that week. I do believe that she can tolerate the sharp debridement today which will be good. I think that cleaning off the wound will likely allow [place] to be able to use a different type of dressing I do not think the Iodoflex will even be necessary based on how clean the wound looks. Fortunately there is no sign of active infection at this time which is great news. No fevers, chills, nausea, vomiting, or diarrhea. 02/12/2021 upon evaluation today patient appears to be doing well with regard to her leg ulcer. We did switch to alginate when she came for nurse visit I think is doing much better for her. Fortunately there does not appear to be any signs of active infection at this time which is great news. No fevers, chills, nausea, vomiting, or diarrhea. MADALEN, GAVIN ([id number]) 02/18/2021 upon evaluation today patient's wound is actually showing signs of good improvement. I am very pleased with where things stand  currently. I do not see any signs of active infection which is great news and overall I feel like she is making good progress. The patient likewise is happy that things are doing so well and that this is measuring somewhat smaller. 02/25/2021 upon evaluation today patient actually appears to be doing quite well in regard to her wounds. She has been tolerating the  dressing changes without complication. Fortunately there does not appear to be any signs of active infection which is great news and overall I am extremely pleased with where things stand. No fevers, chills, nausea, vomiting, or diarrhea. She does have a lot of edema I really do think she needs compression socks to be worn in order to prevent things from causing additional issues for her currently. Especially on the right leg she should be wearing compression socks all the time to be honest. 03/04/2021 upon evaluation today patient's leg ulcer actually appears to be doing pretty well which is great news. There does not appear to be any significant change but overall the appearance is better even though the size is not necessarily reflecting a great improvement. Fortunately there does not appear to be any signs of active infection. No fevers, chills, nausea, vomiting, or diarrhea. 03/12/2021 upon evaluation today patient appears to be doing well with regard to her wounds. She has been tolerating the dressing changes without complication. The good news is that the wound on her left lateral leg is doing much better and is showing signs of good improvement. Overall her swelling is controlled well as well. She does need some compression socks she plans to go Jefferey socks next week. 03/18/2021 on evaluation today patient's wound actually is showing signs of good improvement. She does have a little bit of slough this can require some sharp debridement today. 03/25/2021 upon evaluation today patient appears to be doing well with regard to her wound on the left lateral leg. She has been tolerating the dressing changes without complication. Fortunately there does not appear to be any signs of active infection at this time. No fever chills no 04/01/2021 upon evaluation today patient's wound is showing signs of improvement and overall very pleased with where things stand at this point. There is no evidence of active  infection which is great news as well and in general I think that she is making great progress. 04/09/2021 upon evaluation today patient appears to be doing well with regard to her ankle ulcer. She is making good progress currently which is great news. There does not appear to be any signs of infection also excellent news. In general I am extremely pleased with where things stand today. No fevers, chills, nausea, vomiting, or diarrhea. 04/23/2021 upon inspection today patient appears to be doing quite well with regard to her leg ulcer. She has been tolerating the dressing changes without complication. She is can require some sharp debridement today but overall seems to be doing excellent. 05/06/2021 upon evaluation today patient unfortunately appears to be doing poorly overall in regard to her swelling. She is actually significantly more swollen than last week despite the fact that her wound is doing better this is not a good trend. I think that she probably needs to see her cardiologist ASAP and I discussed that with her today. This includes the fact that honestly I think she probably is getting need an increase in her diuretic or something to try to clear away some of the excess fluid that she is experiencing at the moment. She is  not been doing her pumps even twice a day but again right now more concerned that if she were to do the pump she would fluid overload her lungs causing other issues as far as her breathing is concerned. Obviously I think she needs to contact them and move up the appointment for the 12th of something sooner 2 weeks is a bit too long to wait with how swollen she has. 05/14/2021 upon evaluation today patient's wound actually showing signs of being a little bit smaller compared to previous. She has been making good progress however. Fortunately there does not appear to be any evidence of active infection which is great. Overall I am extremely pleased in that regard. Nonetheless I am  still concerned that she is having quite a bit of issue as far as the swelling is concerned she has been placed on fluid restriction as well as an increase in the furosemide by her doctor. We will see how things progress. 05/21/2021 upon evaluation today patient appears to be doing well with regard to her wound in general. She has been tolerating the dressing changes without complication. With that being said she has been using silver cell for a while and while this was showing signs of improvement it is now gotten to the point where this has slowed down quite significantly. I do believe that switching to a different dressing may be beneficial for her today. 05/28/2021 upon evaluation today patient actually appears to be doing quite well in regard to her wound. In fact there is really not any need for sharp debridement today based on what I see. The Hydrofera Blue is doing great and overall I think that she is managing quite nicely with the compression wrapping. Her edema is still down quite a bit with the wrapping in place. 06/11/2021 upon evaluation today patient appears to be doing well with regard to her leg ulcer. She has been tolerating the dressing changes without complication. Fortunately there does not appear to be any signs of active infection at this time which is great news. No fevers, chills, nausea, vomiting, or diarrhea. She is going require some sharp debridement today. 10/11; left leg ulcer much better looking and with improved surface area. She is using Hydrofera Blue under 3 layer compression. She does not have any stocking on the right leg which in itself has very significant nonpitting edema 06/25/2021 upon evaluation today patient appears to be doing well with regard to the wound on her left leg. She has been tolerating the dressing changes without complication and overall I am extremely pleased with where things stand today. I do not see any evidence of infection which is great  news. 07/02/2021 upon evaluation today patient's wound actually showing signs of excellent improvement and actually very pleased with where we stand today and the overall appearance. Fortunately there does not appear to be any signs of active infection. No fevers, chills, nausea, vomiting, or diarrhea. 07/09/2021 upon evaluation today patient appears to be doing better in regard to her wound this is measuring smaller and she is headed in the appropriate direction based on what I am seeing currently. I do not see any evidence of active infection systemically which is great news. 07/16/2021 upon evaluation today patient appears to be doing pretty well currently in regard to her leg ulcer. This is measuring smaller and looking much better this is great news. Fortunately there does not appear to be any evidence of active infection systemically which is great news as well.  07/23/2021 upon evaluation today patient's wound is actually showing signs of good improvement this is definitely measuring smaller. I do not see any signs of infection which is great news and overall very pleased in that regard. Overall I think that she is doing well with the Tayja Manzer, [place] ([id number]) [place]. 07/30/2020 upon evaluation today patient appears to be doing well in regard to her wound. She has been tolerating the dressing changes without complication think the Hydrofera Blue at this point is actually causing her to dry out a lot as far as the wound bed is concerned. Subsequently I think that we need to try to see what we can do to improve this. Fortunately there does not appear to be any evidence of active infection at this time which is great news. 08/06/2021 upon evaluation today patient appears to be doing well with regards to her wound. Is not measuring significantly smaller initial inspection but upon closer inspection she has a lot of new skin growth across the central portion of the wound. I think will  get very close to complete resolution. 08/13/2021 upon evaluation today patient appears to be doing well with regard to her wound. In fact this appears to be I believe healed although there is still some question as to whether it is completely so. I am concerned about the fact that to be honest she still has some evidence of dry skin that could be just trapping something underneath I still want to debride anything as I am afraid of causing some damage to the good skin for that reason I Georgina Peer probably monitor for 1 more week before completely closing everything out. 08/20/2021 upon evaluation today patient actually appears to be doing excellent. In fact she appears to be completely healed. This was the case last week as well we just wanted to make sure nothing reopened since she does not really have an ability to put on compression socks this is good to be something we need to make sure was well-healed. Nonetheless I think we have achieved that goal as of today. 12/27; this is a patient we discharged 2 weeks ago with wounds on her left lower leg laterally. She was supposed to get stockings and really did not get them. She developed blistering and reopening probably sometime late last week. She has 3 areas in the same area as previously. The mid calf dimensions on the left went from 39 to 51 cm today. She also has very significant edema on the right leg but as far as I am aware has not had wounds in this area 1/4; patient presents for follow-up. She has tolerated the compression wrap well. She has no issues or complaints today. 09/17/2021 upon evaluation today patient appears to be doing well with regard to her wounds. She has been tolerating the dressing changes without complication. Fortunately there does not appear to be any signs of active infection at this time. No fevers, chills, nausea, vomiting, or diarrhea. 10/01/2021 upon evaluation today patient appears to be doing well with regard to her  wound this is actually measuring better and looking smaller and I am very pleased in that regard. Fortunately I do not see any evidence of active infection locally nor systemically at this point. No fevers, chills, nausea, vomiting, or diarrhea. 10/08/2021 upon evaluation patient's wounds are actually showing some signs of improvement measuring a little bit smaller and looking a little bit better. Overall I do not think there is any need for sharp debridement today which  is good news. No fevers, chills, nausea, vomiting, or diarrhea. 10/14/2021 upon evaluation today patient appears to be doing well with regard to her wound. She has been tolerating the dressing changes without complication and overall I am extremely pleased with where things stand today. There is a little bit of film on the surface of the wounds but this was carefully cleaned away with just saline and gauze she really did not want me to do any sharp debridement today. 10/29/2021 upon evaluation today patient appears to be doing quite well in regard to her wounds. She is actually showing signs of excellent improvement which is great news and overall I feel like we are on the right track at this time. She does have a wound on both legs and she does need something compression wise when she heals for that reason we will get a go and see about ordering her bilateral juxta lite compression wraps. 11/05/2021 upon evaluation today patient appears to be doing well with regard to her wound. She has been tolerating the dressing changes without complication. Fortunately I do not see any evidence of active infection locally nor systemically at this time which is great news. No fevers, chills, nausea, vomiting, or diarrhea. 11/12/2021 upon evaluation today patient appears to be doing well with regard to her wounds. She is doing excellent on the left on the right this is not doing quite as well there is some slough and biofilm buildup. 3/14; patient  presents for follow-up. She has no issues or complaints today. She has tolerated the compression wraps well. 11/26/2021 upon evaluation today patient appears to be doing well with regard to her wounds. Both are showing signs of good improvement which is great news. I do not see any evidence of active infection and overall think she is healing quite nicely. 12/03/2021 upon evaluation today patient appears to be doing well with regard to her wounds. Both are showing signs of excellent improvement I am very pleased with where things stand and I think that she is making good progress here. Fortunately I do not see any signs of active infection locally or systemically which is great news. No fevers, chills, nausea, vomiting, or diarrhea. 12-10-2021 upon evaluation today patient's wounds actually appear to be doing awesome. I am extremely pleased with where we stand I think she is making excellent progress. I do not see any signs of active infection locally or systemically at this time. 12-17-2021 upon evaluation today patient's wounds are actually showing signs of good improvement. Fortunately I do not see any evidence of active infection locally nor systemically which is great news. Overall I do not believe that she is showing any signs of significant worsening which is good news. With that being said I do believe that she has a little bit of film on the surface of the wound I was actually able to clear this away with saline and gauze no sharp debridement was necessary. 12-24-2021 upon evaluation today patient appears to be doing well with regard to her wounds. I do not see any signs of active infection currently which is great news and overall I think that we are on the right track here. No fevers, chills, nausea, vomiting, or diarrhea. 12-31-2021 upon evaluation today patient appears to be doing well with regard to her wounds. Both are showing signs of improvement the wound on the right leg is going require  some sharp debridement on the left seems to be doing quite well. 01-07-2022 upon evaluation today patient  appears to be doing well currently in regard to her wounds. She is actually making good progress bilaterally. Both of them require little bit of sharp debridement but not too much which is great news. [place], [place] ([id number]) Upon inspection patient's wound bed actually showed signs of good granulation and epithelization on both legs. She still is having significant issues with the right leg compared to the left although I do feel like both are actually showing signs of pretty good improvement. 01-21-2022 upon evaluation today patient's wound is actually showing signs of excellent improvement I am very pleased with where we stand currently. I do not see any signs of active infection locally or systemically which is great news. No fevers, chills, nausea, vomiting, or diarrhea. 01-28-2022 upon evaluation today patient appears to be doing well with regard to her wound on the left leg this is pretty much just about healed which is great news. Unfortunately in regard to the wound on her right leg this is not doing nearly as well in fact I think we probably need to see about a culture today that was discussed with her. 02-04-2022 upon evaluation today patient appears to be doing well with regard to her left leg which in fact might actually be completely healed I am not 100% sure. Nonetheless this is shown signs of excellent improvement which is great news. With that being said in regards to the right leg there is some slough and film buildup on the surface of the wound although she is feeling much better than last week this does appear to be doing much better than it was last week. Fortunately there does not appear to be any signs of active infection locally or systemically at this time. 02-11-2022 upon evaluation today patient's wounds appear to be doing decently well. In fact the left leg is healed  although there is brand-new skin were definitely given still need to wrap her for the time being. On the right leg this is showing signs of some epithelial growth in the middle of the wound it still measures the same because there is still a larger area with speckled openings but again as far as the entire area being open this is significantly improved which is great news. I am very pleased. 6/13; left leg remains healed and she has a juxta light to apply to it today. There is some concern about whether she is going to be able to do this at home. She lives with a daughter although she has her own disability. On the right the wound looks smaller to me nice epithelialization good edema control. 02-25-2022 upon evaluation today patient appears to be doing well with regard to her legs the left leg is still healed the right leg is doing much better. I think we are on the right track here. With that being said unfortunately she continues to have significant issues with some other problems and she actually tells me on Saturday she was having lunch and went inside to sit down when she tells me she had all of a sudden a sharp sensation that went from her hand through her arm into her leg and her neck and she tells me that she could not see anything for the next hour it was completely black. Slowly after this her vision started to return and has been normal since. It sounds to me as if she may have had a TIA or mini stroke at this point. Nonetheless I think that she needs to have this  checked out ASAP. Her primary care provider is Dr. Clide Deutscher at [facility] We will get a try to get in touch with him. 03-10-2022 upon evaluation today patient's wound actually showed signs of good improvement. She seems to be making excellent progress here. I do not see any evidence of active infection locally or systemically which is great news. No fevers, chills, nausea, vomiting, or diarrhea. She was in the hospital since have  seen her last she ended up finding out that she was having syncopal episodes. That is what was going on with the blacking out and losing vision. Subsequently they adjusted some of her medication she seems to be doing better. 03-18-2022 upon evaluation today patient's wound is actually showing signs of good granulation and epithelization at this point. Fortunately I do not see any signs of active infection locally or systemically which is great news. 03-25-2022 any evidence of active infection locally or systemically which is great news. Any evidence of active infection which is great news and overall the wound is looking better. Upon evaluation today patient appears to be doing well with regard to her leg ulcer. She has been tolerating the dressing changes without complication. Fortunately I do not see 7/25; the patient's wound is on the right lateral lower extremity using silver alginate 3 layer compression. She has chronic venous insufficiency and lymphedema. We are using a juxta lite on the left leg which does not have any open wounds but she is leaving this on all week it seems that there just is not a way to get this changed at home 04-08-2022 upon evaluation today patient appears to be doing well currently in regard to her wound. She has been tolerating the dressing changes without complication. Fortunately I do not see any evidence of active infection locally or systemically which is great news. No fever or chills noted her left leg has reopened at this point. 04-15-2022 upon evaluation patient's wounds actually are showing signs of improvement which is great news. Little by little I do believe her making progress here. In regard to the right leg this is looking better though the measurement does not speak to it there is a lot of new skin growth. On the left leg this is significantly smaller compared to last week most of the blistered area has completely closed. 04-22-2022 upon evaluation today  patient appears to be doing excellent in regard to her wounds both are showing signs of improvement. Her knees and above are still extremely swollen the legs were rewrapped them are down significantly. With that being said I do not think that we will get a be able to keep them that way once were not rapid noted that she really has no way to be able to put juxta lites on and off and nobody to help her with that. That is the biggest concern that we have here at this point. Fortunately I do not think that there is any evidence of infection which is great news. 04-29-2022 upon evaluation today patient appears to be doing well with regard to her wounds. She has been tolerating the dressing changes without complication. Fortunately there does not appear to be any evidence of infection locally or systemically which is great news and overall I am extremely pleased in that regard. Left leg is healed the right leg is very close we discussed today the possibility of her granddaughter helping with putting on and off compression wraps for her. 05-06-2022 upon evaluation today patient appears to be doing  well currently in regard to her left leg which is showing signs of being completely healed. With regard to her right leg she still has an open area though this is doing much better there is a lot less drainage than what we previously noted. Fortunately I see no evidence of active infection locally or systemically at this time which is great news. 05-20-2022 upon evaluation today patient appears to be doing excellent in regard to her leg. The left leg is still healed the right leg is much better. Fortunately I see no signs of infection locally or systemically at this time which is great news. No fevers, chills, nausea, vomiting, or diarrhea. Electronic Signature(s) Signed: 05/20/2022 5:08:22 PM By: Worthy Keeler PA-C Entered By: Worthy Keeler on 05/20/2022 17:08:22 ABI, SHOULTS ([id number]) AYAKO, TAPANES  ([id number]) -------------------------------------------------------------------------------- Physical Exam Details Patient Name: [name] Date of Service: 05/20/2022 2:00 PM Medical Record Number: [id number] Patient Account Number: [id number] Date of Birth/Sex: 07-May-1946 (76 y.o. F) Treating RN: Carlene Coria Primary Care Provider: [name] Other Clinician: Referring Provider: [name] Treating Provider/Extender: Skipper Cliche in Treatment: 41 Constitutional Well-nourished and well-hydrated in no acute distress. Respiratory normal breathing without difficulty. Psychiatric this patient is able to make decisions and demonstrates good insight into disease process. Alert and Oriented x 3. pleasant and cooperative. Notes Upon inspection patient's wound bed showed evidence of minimal slough and biofilm buildup I was able to clear this away with saline and gauze no sharp debridement necessary. Electronic Signature(s) Signed: 05/20/2022 5:08:36 PM By: Worthy Keeler PA-C Entered By: Worthy Keeler on 05/20/2022 17:08:36 [name] ([id number]) -------------------------------------------------------------------------------- Physician Orders Details Patient Name: [name] Date of Service: 05/20/2022 2:00 PM Medical Record Number: [id number] Patient Account Number: [id number] Date of Birth/Sex: 03-07-1946 (76 y.o. F) Treating RN: Carlene Coria Primary Care Provider: [name] Other Clinician: Referring Provider: [name] Treating Provider/Extender: Skipper Cliche in Treatment: 9 Verbal / Phone Orders: No Diagnosis Coding ICD-10 Coding Code Description I89.0 Lymphedema, not elsewhere classified I87.332 Chronic venous hypertension (idiopathic) with ulcer and inflammation of left lower extremity L97.812 Non-pressure chronic ulcer of other part of right lower leg with fat layer exposed Follow-up Appointments o Return Appointment in 1 week. o  Nurse Visit as needed Bathing/ Shower/ Hygiene o May shower with wound dressing protected with water repellent cover or cast protector. o No tub bath. Anesthetic (Use 'Patient Medications' Section for Anesthetic Order Entry) o Lidocaine applied to wound bed Edema Control - Lymphedema / Segmental Compressive Device / Other Bilateral Lower Extremities o Optional: One layer of unna paste to top of compression wrap (to act as an anchor). - she needs this o 3 Layer Compression System for Lymphedema. - left o Elevate, Exercise Daily and Avoid Standing for Long Periods of Time. o Elevate legs to the level of the heart and pump ankles as often as possible o Elevate leg(s) parallel to the floor when sitting. o Compression Pump: Use compression pump on left lower extremity for 60 minutes, twice daily. - You may pump over wraps o Compression Pump: Use compression pump on right lower extremity for 60 minutes, twice daily. - You may pump over wraps Additional Orders / Instructions o Follow Nutritious Diet and Increase Protein Intake Wound Treatment Wound #4 - Lower Leg Wound Laterality: Right, Lateral Cleanser: Soap and Water 1 x Per Week/30 Days Discharge Instructions: Gently cleanse wound with antibacterial soap, rinse and pat dry prior to dressing wounds Cleanser: Wound Cleanser 1 x  Per Week/30 Days Discharge Instructions: Wash your hands with soap and water. Remove old dressing, discard into plastic bag and place into trash. Cleanse the wound with Wound Cleanser prior to applying a clean dressing using gauze sponges, not tissues or cotton balls. Do not scrub or use excessive force. Pat dry using gauze sponges, not tissue or cotton balls. Primary Dressing: Silvercel Small 2x2 (in/in) 1 x Per Week/30 Days Discharge Instructions: Apply Silvercel Small 2x2 (in/in) as instructed Secondary Dressing: ABD Pad 5x9 (in/in) 1 x Per Week/30 Days Discharge Instructions: Cover with ABD  pad Compression Wrap: 3-LAYER WRAP - Profore Lite LF 3 Multilayer Compression Bandaging System 1 x Per Week/30 Days Discharge Instructions: Apply 3 multi-layer wrap as prescribed. Compression Stockings: Circaid Juxta Lite Compression Wrap Right Leg Compression Amount: 20-30 mmHG Discharge Instructions: Apply Circaid Juxta Lite Compression Wrap as directed LATONYIA, LOPATA ([id number]) Electronic Signature(s) Unsigned Entered ByCarlene Coria on 05/20/2022 14:58:10 Signature(s): Date(s): [name] ([id number]) -------------------------------------------------------------------------------- Problem List Details Patient Name: SERITA, DEGROOTE Date of Service: 05/20/2022 2:00 PM Medical Record Number: [id number] Patient Account Number: [id number] Date of Birth/Sex: [birth year] (76 y.o. F) Treating RN: Carlene Coria Primary Care Provider: [name] Other Clinician: Referring Provider: [name] Treating Provider/Extender: Skipper Cliche in Treatment: 69 Active Problems ICD-10 Encounter Code Description Active Date MDM Diagnosis I89.0 Lymphedema, not elsewhere classified 01/16/2021 No Yes I87.332 Chronic venous hypertension (idiopathic) with ulcer and inflammation of 01/16/2021 No Yes left lower extremity L97.812 Non-pressure chronic ulcer of other part of right lower leg with fat layer 01/07/2022 No Yes exposed Inactive Problems ICD-10 Code Description Active Date Inactive Date L97.822 Non-pressure chronic ulcer of other part of left lower leg with fat layer exposed 01/16/2021 01/16/2021 Resolved Problems Electronic Signature(s) Signed: 05/20/2022 2:34:49 PM By: Worthy Keeler PA-C Entered By: Worthy Keeler on 05/20/2022 14:34:49 [name] ([id number]) -------------------------------------------------------------------------------- Progress Note Details Patient Name: [name] Date of Service: 05/20/2022 2:00 PM Medical Record Number: [id number] Patient  Account Number: [id number] Date of Birth/Sex: May 01, 1946 (76 y.o. F) Treating RN: Carlene Coria Primary Care Provider: [name] Other Clinician: Referring Provider: [name] Treating Provider/Extender: Skipper Cliche in Treatment: 69 Subjective Chief Complaint Information obtained from Patient Bilateral LE Ulcers History of Present Illness (HPI) 03/15/2020 upon evaluation today patient presents today for initial evaluation here in our clinic concerning a wound that is on the left posterior lower extremity. Unfortunately this has been giving the patient some discomfort at this point which she notes has been affected her pretty much daily. The patient does have a history of venous insufficiency, hypertension, congestive heart failure, and a history of breast cancer. Fortunately there does not appear to be evidence of active infection at this time which is great news. No fevers, chills, nausea, vomiting, or diarrhea. Wound is not extremely large but does have some slough covering the surface of the wound. This is going require sharp debridement today. 03/15/2020 upon evaluation today patient appears to be doing fairly well in regard to her wound. She does have some slough noted on the surface of the wound currently but I do believe the Iodoflex has been beneficial over the past week. There is no signs of active infection at this time which is great news and overall very pleased with the progress. No fevers, chills, nausea, vomiting, or diarrhea. 04/02/2020 upon evaluation today patient appears to be doing a little better in regard to her wound size wise this is not tremendously smaller she does have some slough  buildup on the surface of the wound. With that being said this is going require some sharp debridement today to clear away some of the necrotic debris. Fortunately there is no evidence of active infection at this time. No fevers, chills, nausea, vomiting, or diarrhea. 04/10/2020 on  evaluation today patient appears to be doing well with regard to her ulcer which is measuring somewhat smaller today. She actually has more granulation tissue noted which is also good news is no need for sharp debridement today. Fortunately there is no evidence of infection either which is also excellent. No fevers, chills, nausea, vomiting, or diarrhea. 04/26/2020 on evaluation today patient's wound actually is showing signs of good improvement which is great news. There does not appear to be any evidence of active infection. I do believe she is tolerating the collagen at this point. 05/03/2020 upon evaluation today patient's wound actually showed signs of good granulation at this time there does not appear to be any evidence of active infection which is great news and overall very pleased with where things stand. With that being said I do believe that the patient is can require some sharp debridement today but fortunately nothing too significant. 05/11/20 on evaluation today patient appears to be doing well in regard to her leg ulcer. She's been tolerating the dressing changes without complication. Fortunately there is no signs of active infection at this time. Overall I'm very pleased with where things stand. 05/18/2020 upon evaluation today patient's wound is showing signs of improvement is very dry and I think the collagen for the most part just dried to the wound bed. I am going to clean this away with sharp debridement in order to get this under control in my opinion. 05/25/2020 upon evaluation today patient actually appears to be doing well in regard to her leg ulcer. In fact upon inspection it appears she could potentially be completely healed but that is not guaranteed based on what we are seeing. There does not appear to be any signs of active infection which is great news. 05/31/2020 on evaluation today patient appears to be doing well with regard to her wounds. She in fact appears to be almost  completely healed on the left leg there is just a very small opening remaining point 1 06/08/2020 upon evaluation today patient appears to be doing excellent in regard to her leg ulcer on the left in fact it appears to be that she is completely healed as of today. I see no signs of active infection at this time which is great news. No fevers, chills, nausea, vomiting, or diarrhea. READMISSION 01/16/2021 Patient that we have had in this clinic with a wound on the left posterior calf in the summer 2021 extending into October. She was felt to have chronic venous insufficiency. Per the patient's description she was discharged with what sounds like external compression stockings although she could not afford the "$75 per leg". She therefore did not get anything. Apparently her wound that she has currently is been in existence since January. She has been followed at vein and vascular with Unna boots and I think calcium alginate. She had ABIs done on 06/25/2020 that showed an noncompressible ABI on the right leg and the left leg but triphasic waveforms with good great toe pressures and a TBI of 0.90 on the right and 0.91 on the left Surprisingly the patient also had venous reflux studies that were really unremarkable. This included no evidence of DVTs or SVTs but there was no evidence  of deep venous insufficiency or superficial venous insufficiency in the greater saphenous or short saphenous veins bilaterally. I would wonder about more central venous issues or perhaps this is all lymphedema 01/25/2021 upon evaluation today patient appears to be doing decently well and guard to her wound. The good news is the Iodoflex that is job she saw Dr. Dellia Nims last week for readmission and to be honest I think that this has done extremely well over that week. I do believe that she can tolerate the sharp debridement today which will be good. I think that cleaning off the wound will likely allow [place] to be able to use a  different type of dressing I do not think the Iodoflex will even be necessary based on how clean the wound looks. Fortunately there is no sign of active infection at this time which is great news. No fevers, chills, nausea, vomiting, or diarrhea. CRISTELLA, STIVER ([id number]) 02/12/2021 upon evaluation today patient appears to be doing well with regard to her leg ulcer. We did switch to alginate when she came for nurse visit I think is doing much better for her. Fortunately there does not appear to be any signs of active infection at this time which is great news. No fevers, chills, nausea, vomiting, or diarrhea. 02/18/2021 upon evaluation today patient's wound is actually showing signs of good improvement. I am very pleased with where things stand currently. I do not see any signs of active infection which is great news and overall I feel like she is making good progress. The patient likewise is happy that things are doing so well and that this is measuring somewhat smaller. 02/25/2021 upon evaluation today patient actually appears to be doing quite well in regard to her wounds. She has been tolerating the dressing changes without complication. Fortunately there does not appear to be any signs of active infection which is great news and overall I am extremely pleased with where things stand. No fevers, chills, nausea, vomiting, or diarrhea. She does have a lot of edema I really do think she needs compression socks to be worn in order to prevent things from causing additional issues for her currently. Especially on the right leg she should be wearing compression socks all the time to be honest. 03/04/2021 upon evaluation today patient's leg ulcer actually appears to be doing pretty well which is great news. There does not appear to be any significant change but overall the appearance is better even though the size is not necessarily reflecting a great improvement. Fortunately there does not appear to be  any signs of active infection. No fevers, chills, nausea, vomiting, or diarrhea. 03/12/2021 upon evaluation today patient appears to be doing well with regard to her wounds. She has been tolerating the dressing changes without complication. The good news is that the wound on her left lateral leg is doing much better and is showing signs of good improvement. Overall her swelling is controlled well as well. She does need some compression socks she plans to go Jefferey socks next week. 03/18/2021 on evaluation today patient's wound actually is showing signs of good improvement. She does have a little bit of slough this can require some sharp debridement today. 03/25/2021 upon evaluation today patient appears to be doing well with regard to her wound on the left lateral leg. She has been tolerating the dressing changes without complication. Fortunately there does not appear to be any signs of active infection at this time. No fever chills no 04/01/2021 upon evaluation  today patient's wound is showing signs of improvement and overall very pleased with where things stand at this point. There is no evidence of active infection which is great news as well and in general I think that she is making great progress. 04/09/2021 upon evaluation today patient appears to be doing well with regard to her ankle ulcer. She is making good progress currently which is great news. There does not appear to be any signs of infection also excellent news. In general I am extremely pleased with where things stand today. No fevers, chills, nausea, vomiting, or diarrhea. 04/23/2021 upon inspection today patient appears to be doing quite well with regard to her leg ulcer. She has been tolerating the dressing changes without complication. She is can require some sharp debridement today but overall seems to be doing excellent. 05/06/2021 upon evaluation today patient unfortunately appears to be doing poorly overall in regard to her swelling.  She is actually significantly more swollen than last week despite the fact that her wound is doing better this is not a good trend. I think that she probably needs to see her cardiologist ASAP and I discussed that with her today. This includes the fact that honestly I think she probably is getting need an increase in her diuretic or something to try to clear away some of the excess fluid that she is experiencing at the moment. She is not been doing her pumps even twice a day but again right now more concerned that if she were to do the pump she would fluid overload her lungs causing other issues as far as her breathing is concerned. Obviously I think she needs to contact them and move up the appointment for the 12th of something sooner 2 weeks is a bit too long to wait with how swollen she has. 05/14/2021 upon evaluation today patient's wound actually showing signs of being a little bit smaller compared to previous. She has been making good progress however. Fortunately there does not appear to be any evidence of active infection which is great. Overall I am extremely pleased in that regard. Nonetheless I am still concerned that she is having quite a bit of issue as far as the swelling is concerned she has been placed on fluid restriction as well as an increase in the furosemide by her doctor. We will see how things progress. 05/21/2021 upon evaluation today patient appears to be doing well with regard to her wound in general. She has been tolerating the dressing changes without complication. With that being said she has been using silver cell for a while and while this was showing signs of improvement it is now gotten to the point where this has slowed down quite significantly. I do believe that switching to a different dressing may be beneficial for her today. 05/28/2021 upon evaluation today patient actually appears to be doing quite well in regard to her wound. In fact there is really not any need  for sharp debridement today based on what I see. The Hydrofera Blue is doing great and overall I think that she is managing quite nicely with the compression wrapping. Her edema is still down quite a bit with the wrapping in place. 06/11/2021 upon evaluation today patient appears to be doing well with regard to her leg ulcer. She has been tolerating the dressing changes without complication. Fortunately there does not appear to be any signs of active infection at this time which is great news. No fevers, chills, nausea, vomiting, or diarrhea.  She is going require some sharp debridement today. 10/11; left leg ulcer much better looking and with improved surface area. She is using Hydrofera Blue under 3 layer compression. She does not have any stocking on the right leg which in itself has very significant nonpitting edema 06/25/2021 upon evaluation today patient appears to be doing well with regard to the wound on her left leg. She has been tolerating the dressing changes without complication and overall I am extremely pleased with where things stand today. I do not see any evidence of infection which is great news. 07/02/2021 upon evaluation today patient's wound actually showing signs of excellent improvement and actually very pleased with where we stand today and the overall appearance. Fortunately there does not appear to be any signs of active infection. No fevers, chills, nausea, vomiting, or diarrhea. 07/09/2021 upon evaluation today patient appears to be doing better in regard to her wound this is measuring smaller and she is headed in the appropriate direction based on what I am seeing currently. I do not see any evidence of active infection systemically which is great news. 07/16/2021 upon evaluation today patient appears to be doing pretty well currently in regard to her leg ulcer. This is measuring smaller and looking much better this is great news. Fortunately there does not appear to be any  evidence of active infection systemically which is great news JANELY, GULLICKSON ([id number]) as well. 07/23/2021 upon evaluation today patient's wound is actually showing signs of good improvement this is definitely measuring smaller. I do not see any signs of infection which is great news and overall very pleased in that regard. Overall I think that she is doing well with the [facility]. 07/30/2020 upon evaluation today patient appears to be doing well in regard to her wound. She has been tolerating the dressing changes without complication think the Hydrofera Blue at this point is actually causing her to dry out a lot as far as the wound bed is concerned. Subsequently I think that we need to try to see what we can do to improve this. Fortunately there does not appear to be any evidence of active infection at this time which is great news. 08/06/2021 upon evaluation today patient appears to be doing well with regards to her wound. Is not measuring significantly smaller initial inspection but upon closer inspection she has a lot of new skin growth across the central portion of the wound. I think will get very close to complete resolution. 08/13/2021 upon evaluation today patient appears to be doing well with regard to her wound. In fact this appears to be I believe healed although there is still some question as to whether it is completely so. I am concerned about the fact that to be honest she still has some evidence of dry skin that could be just trapping something underneath I still want to debride anything as I am afraid of causing some damage to the good skin for that reason I Georgina Peer probably monitor for 1 more week before completely closing everything out. 08/20/2021 upon evaluation today patient actually appears to be doing excellent. In fact she appears to be completely healed. This was the case last week as well we just wanted to make sure nothing reopened since she does not really have  an ability to put on compression socks this is good to be something we need to make sure was well-healed. Nonetheless I think we have achieved that goal as of today. 12/27; this is a patient  we discharged 2 weeks ago with wounds on her left lower leg laterally. She was supposed to get stockings and really did not get them. She developed blistering and reopening probably sometime late last week. She has 3 areas in the same area as previously. The mid calf dimensions on the left went from 39 to 51 cm today. She also has very significant edema on the right leg but as far as I am aware has not had wounds in this area 1/4; patient presents for follow-up. She has tolerated the compression wrap well. She has no issues or complaints today. 09/17/2021 upon evaluation today patient appears to be doing well with regard to her wounds. She has been tolerating the dressing changes without complication. Fortunately there does not appear to be any signs of active infection at this time. No fevers, chills, nausea, vomiting, or diarrhea. 10/01/2021 upon evaluation today patient appears to be doing well with regard to her wound this is actually measuring better and looking smaller and I am very pleased in that regard. Fortunately I do not see any evidence of active infection locally nor systemically at this point. No fevers, chills, nausea, vomiting, or diarrhea. 10/08/2021 upon evaluation patient's wounds are actually showing some signs of improvement measuring a little bit smaller and looking a little bit better. Overall I do not think there is any need for sharp debridement today which is good news. No fevers, chills, nausea, vomiting, or diarrhea. 10/14/2021 upon evaluation today patient appears to be doing well with regard to her wound. She has been tolerating the dressing changes without complication and overall I am extremely pleased with where things stand today. There is a little bit of film on the surface of the  wounds but this was carefully cleaned away with just saline and gauze she really did not want me to do any sharp debridement today. 10/29/2021 upon evaluation today patient appears to be doing quite well in regard to her wounds. She is actually showing signs of excellent improvement which is great news and overall I feel like we are on the right track at this time. She does have a wound on both legs and she does need something compression wise when she heals for that reason we will get a go and see about ordering her bilateral juxta lite compression wraps. 11/05/2021 upon evaluation today patient appears to be doing well with regard to her wound. She has been tolerating the dressing changes without complication. Fortunately I do not see any evidence of active infection locally nor systemically at this time which is great news. No fevers, chills, nausea, vomiting, or diarrhea. 11/12/2021 upon evaluation today patient appears to be doing well with regard to her wounds. She is doing excellent on the left on the right this is not doing quite as well there is some slough and biofilm buildup. 3/14; patient presents for follow-up. She has no issues or complaints today. She has tolerated the compression wraps well. 11/26/2021 upon evaluation today patient appears to be doing well with regard to her wounds. Both are showing signs of good improvement which is great news. I do not see any evidence of active infection and overall think she is healing quite nicely. 12/03/2021 upon evaluation today patient appears to be doing well with regard to her wounds. Both are showing signs of excellent improvement I am very pleased with where things stand and I think that she is making good progress here. Fortunately I do not see any signs of active  infection locally or systemically which is great news. No fevers, chills, nausea, vomiting, or diarrhea. 12-10-2021 upon evaluation today patient's wounds actually appear to be doing  awesome. I am extremely pleased with where we stand I think she is making excellent progress. I do not see any signs of active infection locally or systemically at this time. 12-17-2021 upon evaluation today patient's wounds are actually showing signs of good improvement. Fortunately I do not see any evidence of active infection locally nor systemically which is great news. Overall I do not believe that she is showing any signs of significant worsening which is good news. With that being said I do believe that she has a little bit of film on the surface of the wound I was actually able to clear this away with saline and gauze no sharp debridement was necessary. 12-24-2021 upon evaluation today patient appears to be doing well with regard to her wounds. I do not see any signs of active infection currently which is great news and overall I think that we are on the right track here. No fevers, chills, nausea, vomiting, or diarrhea. 12-31-2021 upon evaluation today patient appears to be doing well with regard to her wounds. Both are showing signs of improvement the wound TREGONING, Briane ([id number]) on the right leg is going require some sharp debridement on the left seems to be doing quite well. 01-07-2022 upon evaluation today patient appears to be doing well currently in regard to her wounds. She is actually making good progress bilaterally. Both of them require little bit of sharp debridement but not too much which is great news. Upon inspection patient's wound bed actually showed signs of good granulation and epithelization on both legs. She still is having significant issues with the right leg compared to the left although I do feel like both are actually showing signs of pretty good improvement. 01-21-2022 upon evaluation today patient's wound is actually showing signs of excellent improvement I am very pleased with where we stand currently. I do not see any signs of active infection locally or  systemically which is great news. No fevers, chills, nausea, vomiting, or diarrhea. 01-28-2022 upon evaluation today patient appears to be doing well with regard to her wound on the left leg this is pretty much just about healed which is great news. Unfortunately in regard to the wound on her right leg this is not doing nearly as well in fact I think we probably need to see about a culture today that was discussed with her. 02-04-2022 upon evaluation today patient appears to be doing well with regard to her left leg which in fact might actually be completely healed I am not 100% sure. Nonetheless this is shown signs of excellent improvement which is great news. With that being said in regards to the right leg there is some slough and film buildup on the surface of the wound although she is feeling much better than last week this does appear to be doing much better than it was last week. Fortunately there does not appear to be any signs of active infection locally or systemically at this time. 02-11-2022 upon evaluation today patient's wounds appear to be doing decently well. In fact the left leg is healed although there is brand-new skin were definitely given still need to wrap her for the time being. On the right leg this is showing signs of some epithelial growth in the middle of the wound it still measures the same because there is still a  larger area with speckled openings but again as far as the entire area being open this is significantly improved which is great news. I am very pleased. 6/13; left leg remains healed and she has a juxta light to apply to it today. There is some concern about whether she is going to be able to do this at home. She lives with a daughter although she has her own disability. On the right the wound looks smaller to me nice epithelialization good edema control. 02-25-2022 upon evaluation today patient appears to be doing well with regard to her legs the left leg is still  healed the right leg is doing much better. I think we are on the right track here. With that being said unfortunately she continues to have significant issues with some other problems and she actually tells me on Saturday she was having lunch and went inside to sit down when she tells me she had all of a sudden a sharp sensation that went from her hand through her arm into her leg and her neck and she tells me that she could not see anything for the next hour it was completely black. Slowly after this her vision started to return and has been normal since. It sounds to me as if she may have had a TIA or mini stroke at this point. Nonetheless I think that she needs to have this checked out ASAP. Her primary care provider is Dr. Clide Deutscher at [facility]. We will get a try to get in touch with him. 03-10-2022 upon evaluation today patient's wound actually showed signs of good improvement. She seems to be making excellent progress here. I do not see any evidence of active infection locally or systemically which is great news. No fevers, chills, nausea, vomiting, or diarrhea. She was in the hospital since have seen her last she ended up finding out that she was having syncopal episodes. That is what was going on with the blacking out and losing vision. Subsequently they adjusted some of her medication she seems to be doing better. 03-18-2022 upon evaluation today patient's wound is actually showing signs of good granulation and epithelization at this point. Fortunately I do not see any signs of active infection locally or systemically which is great news. 03-25-2022 any evidence of active infection locally or systemically which is great news. Any evidence of active infection which is great news and overall the wound is looking better. Upon evaluation today patient appears to be doing well with regard to her leg ulcer. She has been tolerating the dressing changes without complication. Fortunately I do not  see 7/25; the patient's wound is on the right lateral lower extremity using silver alginate 3 layer compression. She has chronic venous insufficiency and lymphedema. We are using a juxta lite on the left leg which does not have any open wounds but she is leaving this on all week it seems that there just is not a way to get this changed at home 04-08-2022 upon evaluation today patient appears to be doing well currently in regard to her wound. She has been tolerating the dressing changes without complication. Fortunately I do not see any evidence of active infection locally or systemically which is great news. No fever or chills noted her left leg has reopened at this point. 04-15-2022 upon evaluation patient's wounds actually are showing signs of improvement which is great news. Little by little I do believe her making progress here. In regard to the right leg this is  looking better though the measurement does not speak to it there is a lot of new skin growth. On the left leg this is significantly smaller compared to last week most of the blistered area has completely closed. 04-22-2022 upon evaluation today patient appears to be doing excellent in regard to her wounds both are showing signs of improvement. Her knees and above are still extremely swollen the legs were rewrapped them are down significantly. With that being said I do not think that we will get a be able to keep them that way once were not rapid noted that she really has no way to be able to put juxta lites on and off and nobody to help her with that. That is the biggest concern that we have here at this point. Fortunately I do not think that there is any evidence of infection which is great news. 04-29-2022 upon evaluation today patient appears to be doing well with regard to her wounds. She has been tolerating the dressing changes without complication. Fortunately there does not appear to be any evidence of infection locally or systemically  which is great news and overall I am extremely pleased in that regard. Left leg is healed the right leg is very close we discussed today the possibility of her granddaughter helping with putting on and off compression wraps for her. 05-06-2022 upon evaluation today patient appears to be doing well currently in regard to her left leg which is showing signs of being completely healed. With regard to her right leg she still has an open area though this is doing much better there is a lot less drainage than what we previously noted. Fortunately I see no evidence of active infection locally or systemically at this time which is great news. 05-20-2022 upon evaluation today patient appears to be doing excellent in regard to her leg. The left leg is still healed the right leg is much better. Fortunately I see no signs of infection locally or systemically at this time which is great news. No fevers, chills, nausea, vomiting, or diarrhea. TALLULA, GRINDLE ([id number]) Objective Constitutional Well-nourished and well-hydrated in no acute distress. Vitals Time Taken: 2:23 PM, Height: 69 in, Weight: 244 lbs, BMI: 36, Temperature: 97.8 F, Pulse: 68 bpm, Respiratory Rate: 18 breaths/min, Blood Pressure: 119/55 mmHg. Respiratory normal breathing without difficulty. Psychiatric this patient is able to make decisions and demonstrates good insight into disease process. Alert and Oriented x 3. pleasant and cooperative. General Notes: Upon inspection patient's wound bed showed evidence of minimal slough and biofilm buildup I was able to clear this away with saline and gauze no sharp debridement necessary. Integumentary (Hair, Skin) Wound #4 status is Open. Original cause of wound was Gradually Appeared. The date acquired was: 10/27/2021. The wound has been in treatment 29 weeks. The wound is located on the Right,Lateral Lower Leg. The wound measures 6cm length x 4cm width x 0.1cm depth; 18.85cm^2 area and 1.885cm^3  volume. There is Fat Layer (Subcutaneous Tissue) exposed. There is no tunneling or undermining noted. There is a medium amount of serosanguineous drainage noted. There is medium (34-66%) pink granulation within the wound bed. There is a medium (34-66%) amount of necrotic tissue within the wound bed including Adherent Slough. 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 There was a Three Layer Compression Therapy Procedure by Carlene Coria, RN. Post procedure Diagnosis Wound #4: Same as Pre-Procedure There was a Three Layer Compression Therapy Procedure by Carlene Coria, RN. Post procedure Diagnosis Wound #: Same as Pre-Procedure Plan Follow-up Appointments: Return Appointment in 1 week. Nurse Visit as needed Bathing/ Shower/ Hygiene: May shower with wound dressing protected with water repellent cover or cast protector. No tub bath. MICHAELINA, BLANDINO ([id number]) Anesthetic (Use 'Patient Medications' Section for Anesthetic Order Entry): Lidocaine applied to wound bed Edema Control - Lymphedema / Segmental Compressive Device / Other: Optional: One layer of unna paste to top of compression wrap (to act as an anchor). - she needs this 3 Layer Compression System for Lymphedema. - left Elevate, Exercise Daily and Avoid Standing for Long Periods of Time. Elevate legs to the level of the heart and pump ankles as often as possible Elevate leg(s) parallel to the floor when sitting. Compression Pump: Use compression pump on left lower extremity for 60 minutes, twice daily. - You may pump over wraps Compression Pump: Use compression pump on right lower extremity for 60 minutes, twice  daily. - You may pump over wraps Additional Orders / Instructions: Follow Nutritious Diet and Increase Protein Intake WOUND #4: - Lower Leg Wound Laterality: Right, Lateral Cleanser: Soap and Water 1 x Per Week/30 Days Discharge Instructions: Gently cleanse wound with antibacterial soap, rinse and pat dry prior to dressing wounds Cleanser: Wound Cleanser 1 x Per Week/30 Days Discharge Instructions: Wash your hands with soap and water. Remove old dressing, discard into plastic bag and place into trash. Cleanse the wound with Wound Cleanser prior to applying a clean dressing using gauze sponges, not tissues or cotton balls. Do not scrub or use excessive force. Pat dry using gauze sponges, not tissue or cotton balls. Primary Dressing: Silvercel Small 2x2 (in/in) 1 x Per Week/30 Days Discharge Instructions: Apply Silvercel Small 2x2 (in/in) as instructed Secondary Dressing: ABD Pad 5x9 (in/in) 1 x Per Week/30 Days Discharge Instructions: Cover with ABD pad Compression Wrap: 3-LAYER WRAP - Profore Lite LF 3 Multilayer Compression Bandaging System 1 x Per Week/30 Days Discharge Instructions: Apply 3 multi-layer wrap as prescribed. Compression Stockings: Circaid Juxta Lite Compression Wrap Compression Amount: 20-30 mmHg (right) Discharge Instructions: Apply Circaid Juxta Lite Compression Wrap as directed 1. I would recommend that we go ahead and continue with the wound care measures as before and the patient is in agreement with plan. This includes the use of the 3 layer compression wrap which I do believe is doing very well. 2. I am also can recommend that we continue with the silver cell followed by ABD pad underneath. 3. I would also recommend she continue to use her pumps and elevate her legs is much as possible. We will see patient back for reevaluation in 1 week here in the clinic. If anything worsens or changes patient will contact our office for additional recommendations. Electronic  Signature(s) Signed: 05/20/2022 5:09:13 PM By: Worthy Keeler PA-C Entered By: Worthy Keeler on 05/20/2022 17:09:13 [name] ([id number]) -------------------------------------------------------------------------------- SuperBill Details Patient Name: [name] Date of Service: 05/20/2022 Medical Record Number: [id number] Patient Account Number: [id number] Date of Birth/Sex: [birth year] (76 y.o. F) Treating RN: Carlene Coria Primary Care Provider: [name] Other Clinician: Referring Provider: [name] Treating Provider/Extender: Skipper Cliche in Treatment: 69 Diagnosis Coding ICD-10 Codes Code Description I89.0 Lymphedema, not elsewhere classified I87.332 Chronic venous hypertension (idiopathic) with  ulcer and inflammation of left lower extremity L97.812 Non-pressure chronic ulcer of other part of right lower leg with fat layer exposed Facility Procedures CPT4: Description Modifier Quantity Code [id number] BILATERAL: Application of multi-layer venous compression system; leg (below knee), including 1 ankle and foot. Electronic Signature(s) Unsigned Entered By: Carlene Coria on 05/20/2022 14:59:10 Signature(s): Date(s):

## 2022-05-21 NOTE — Progress Notes (Signed)
[name] ([id number]) Visit Report for 05/20/2022 Arrival Information Details Patient Name: [name] Date of Service: 05/20/2022 2:00 PM Medical Record Number: [id number] Patient Account Number: [id number] Date of Birth/Sex: [birth year] (76 y.o. F) Treating RN: Carlene Coria Primary Care Giara Mcgaughey: [name] Other Clinician: Referring Louana Fontenot: [name] Treating Desean Heemstra/Extender: Skipper Cliche in Treatment: 69 Visit Information History Since Last Visit All ordered tests and consults were completed: No Patient Arrived: Wheel Chair Added or deleted any medications: No Arrival Time: 14:17 Any new allergies or adverse reactions: No Accompanied By: self Had a fall or experienced change in No Transfer Assistance: None activities of daily living that may affect Patient Identification Verified: Yes risk of falls: Secondary Verification Process Completed: Yes Signs or symptoms of abuse/neglect since last visito No Patient Requires Transmission-Based Precautions: No Hospitalized since last visit: No Patient Has Alerts: Yes Implantable device outside of the clinic excluding No Patient Alerts: NOT DIABETIC cellular tissue based products placed in the center since last visit: Has Dressing in Place as Prescribed: Yes Has Compression in Place as Prescribed: Yes Pain Present Now: No Electronic Signature(s) Signed: 05/21/2022 8:36:51 AM By: Carlene Coria RN Entered By: Carlene Coria on 05/20/2022 14:23:55 [name] ([id number]) -------------------------------------------------------------------------------- Clinic Level of Care Assessment Details Patient Name: [name] Date of Service: 05/20/2022 2:00 PM Medical Record Number: [id number] Patient Account Number: [id number] Date of Birth/Sex: [birth year] (76 y.o. F) Treating RN: Carlene Coria Primary Care Lessly Stigler: [name] Other Clinician: Referring Libra Gatz: [name] Treating Latria Mccarron/Extender:  Skipper Cliche in Treatment: 69 Clinic Level of Care Assessment Items TOOL 1 Quantity Score '[]'$  - Use when EandM and Procedure is performed on INITIAL visit 0 ASSESSMENTS - Nursing Assessment / Reassessment '[]'$  - General Physical Exam (combine w/ comprehensive assessment (listed just below) when performed on new 0 pt. evals) '[]'$  - 0 Comprehensive Assessment (HX, ROS, Risk Assessments, Wounds Hx, etc.) ASSESSMENTS - Wound and Skin Assessment / Reassessment '[]'$  - Dermatologic / Skin Assessment (not related to wound area) 0 ASSESSMENTS - Ostomy and/or Continence Assessment and Care '[]'$  - Incontinence Assessment and Management 0 '[]'$  - 0 Ostomy Care Assessment and Management (repouching, etc.) PROCESS - Coordination of Care '[]'$  - Simple Patient / Family Education for ongoing care 0 '[]'$  - 0 Complex (extensive) Patient / Family Education for ongoing care '[]'$  - 0 Staff obtains Programmer, systems, Records, Test Results / Process Orders '[]'$  - 0 Staff telephones HHA, Nursing Homes / Clarify orders / etc '[]'$  - 0 Routine Transfer to another Facility (non-emergent condition) '[]'$  - 0 Routine Hospital Admission (non-emergent condition) '[]'$  - 0 New Admissions / Biomedical engineer / Ordering NPWT, Apligraf, etc. '[]'$  - 0 Emergency Hospital Admission (emergent condition) PROCESS - Special Needs '[]'$  - Pediatric / Minor Patient Management 0 '[]'$  - 0 Isolation Patient Management '[]'$  - 0 Hearing / Language / Visual special needs '[]'$  - 0 Assessment of Community assistance (transportation, D/C planning, etc.) '[]'$  - 0 Additional assistance / Altered mentation '[]'$  - 0 Support Surface(s) Assessment (bed, cushion, seat, etc.) INTERVENTIONS - Miscellaneous '[]'$  - External ear exam 0 '[]'$  - 0 Patient Transfer (multiple staff / Civil Service fast streamer / Similar devices) '[]'$  - 0 Simple Staple / Suture removal (25 or less) '[]'$  - 0 Complex Staple / Suture removal (26 or more) '[]'$  - 0 Hypo/Hyperglycemic Management (do not check if billed  separately) '[]'$  - 0 Ankle / Brachial Index (ABI) - do not check if billed separately Has the patient been seen at the hospital within the  last three years: Yes Total Score: 0 Level Of Care: ____ [name] ([id number]) Electronic Signature(s) Signed: 05/21/2022 8:36:51 AM By: Carlene Coria RN Entered By: Carlene Coria on 05/20/2022 14:58:58 [name] ([id number]) -------------------------------------------------------------------------------- Compression Therapy Details Patient Name: [name] Date of Service: 05/20/2022 2:00 PM Medical Record Number: [id number] Patient Account Number: [id number] Date of Birth/Sex: 12-01-1945 (76 y.o. F) Treating RN: Carlene Coria Primary Care Lennon Boutwell: [name] Other Clinician: Referring Jnaya Butrick: [name] Treating Deontae Robson/Extender: Skipper Cliche in Treatment: 69 Compression Therapy Performed for Wound Assessment: Wound #4 Right,Lateral Lower Leg Performed By: Clinician Carlene Coria, RN Compression Type: Three Layer Post Procedure Diagnosis Same as Pre-procedure Electronic Signature(s) Signed: 05/21/2022 8:36:51 AM By: Carlene Coria RN Entered By: Carlene Coria on 05/20/2022 14:58:37 [name] ([id number]) -------------------------------------------------------------------------------- Compression Therapy Details Patient Name: [name] Date of Service: 05/20/2022 2:00 PM Medical Record Number: [id number] Patient Account Number: [id number] Date of Birth/Sex: [birth year] (76 y.o. F) Treating RN: Carlene Coria Primary Care Virgia Kelner: [name] Other Clinician: Referring Nicodemus Denk: [name] Treating Katelynn Heidler/Extender: Skipper Cliche in Treatment: 69 Compression Therapy Performed for Wound Assessment: NonWound Condition Lymphedema - Left Leg Performed By: Clinician Carlene Coria, RN Compression Type: Three Layer Post Procedure Diagnosis Same as Pre-procedure Electronic Signature(s) Signed: 05/21/2022  8:36:51 AM By: Carlene Coria RN Entered By: Carlene Coria on 05/20/2022 14:58:51 [name] ([id number]) -------------------------------------------------------------------------------- Encounter Discharge Information Details Patient Name: [name] Date of Service: 05/20/2022 2:00 PM Medical Record Number: [id number] Patient Account Number: [id number] Date of Birth/Sex: Jun 26, 1946 (76 y.o. F) Treating RN: Carlene Coria Primary Care Kiran Lapine: [name] Other Clinician: Referring Real Cona: [name] Treating Kiyona Mcnall/Extender: Skipper Cliche in Treatment: 57 Encounter Discharge Information Items Discharge Condition: Stable Ambulatory Status: Wheelchair Discharge Destination: Home Transportation: Private Auto Accompanied By: self Schedule Follow-up Appointment: Yes Clinical Summary of Care: Electronic Signature(s) Signed: 05/21/2022 8:36:51 AM By: Carlene Coria RN Entered By: Carlene Coria on 05/20/2022 14:59:55 [name] ([id number]) -------------------------------------------------------------------------------- Lower Extremity Assessment Details Patient Name: [name] Date of Service: 05/20/2022 2:00 PM Medical Record Number: [id number] Patient Account Number: [id number] Date of Birth/Sex: 02-01-1946 (76 y.o. F) Treating RN: Carlene Coria Primary Care Beyonka Pitney: [name] Other Clinician: Referring Bellamy Rubey: [name] Treating Gaspare Netzel/Extender: Skipper Cliche in Treatment: 29 Electronic Signature(s) Signed: 05/21/2022 8:36:51 AM By: Carlene Coria RN Entered By: Carlene Coria on 05/20/2022 14:34:54 [name] ([id number]) -------------------------------------------------------------------------------- Multi Wound Chart Details Patient Name: [name] Date of Service: 05/20/2022 2:00 PM Medical Record Number: [id number] Patient Account Number: [id number] Date of Birth/Sex: 26-Dec-1945 (76 y.o. F) Treating RN: Carlene Coria Primary Care Kasara Schomer: [name] Other Clinician: Referring Jennifr Gaeta: [name] Treating Nalda Shackleford/Extender: Skipper Cliche in Treatment: 69 Vital Signs Height(in): 69 Pulse(bpm): 26 Weight(lbs): 244 Blood Pressure(mmHg): 119/55 Body Mass Index(BMI): 36 Temperature(F): 97.8 Respiratory Rate(breaths/min): 18 Photos: [N/A:N/A] Wound Location: Right, Lateral Lower Leg N/A N/A Wounding Event: Gradually Appeared N/A N/A Primary Etiology: Venous Leg Ulcer N/A N/A Comorbid History: Cataracts, Lymphedema, Sleep N/A N/A Apnea, Congestive Heart Failure, Hypertension, Osteoarthritis, Neuropathy, Received Chemotherapy, Received Radiation Date Acquired: 10/27/2021 N/A N/A Weeks of Treatment: 29 N/A N/A Wound Status: Open N/A N/A Wound Recurrence: No N/A N/A Measurements L x W x D (cm) 6x4x0.1 N/A N/A Area (cm) : 18.85 N/A N/A Volume (cm) : 1.885 N/A N/A % Reduction in Area: -1500.20% N/A N/A % Reduction in Volume: -1497.50% N/A N/A Classification: Full Thickness Without Exposed N/A N/A Support Structures Exudate Amount: Medium N/A N/A Exudate Type: Serosanguineous N/A N/A Exudate Color:  red, brown N/A N/A Granulation Amount: Medium (34-66%) N/A N/A Granulation Quality: Pink N/A N/A Necrotic Amount: Medium (34-66%) N/A N/A Exposed Structures: Fat Layer (Subcutaneous Tissue): N/A N/A Yes Fascia: No Tendon: No Muscle: No Joint: No Bone: No Epithelialization: None N/A N/A Treatment Notes Electronic Signature(s) Signed: 05/21/2022 8:36:51 AM By: Carlene Coria RN Entered By: Carlene Coria on 05/20/2022 14:35:10 [name] ([id number]) [name], [name] ([id number]) -------------------------------------------------------------------------------- Multi-Disciplinary Care Plan Details Patient Name: [name] Date of Service: 05/20/2022 2:00 PM Medical Record Number: [id number] Patient Account Number: [id number] Date of Birth/Sex: 1945/10/18 (76 y.o.  F) Treating RN: Carlene Coria Primary Care Skylynn Burkley: [name] Other Clinician: Referring Tequia Wolman: [name] Treating Sydne Krahl/Extender: Skipper Cliche in Treatment: 60 Active Inactive Electronic Signature(s) Signed: 05/21/2022 8:36:51 AM By: Carlene Coria RN Entered By: Carlene Coria on 05/20/2022 14:35:03 [name] ([id number]) -------------------------------------------------------------------------------- Pain Assessment Details Patient Name: [name] Date of Service: 05/20/2022 2:00 PM Medical Record Number: [id number] Patient Account Number: [id number] Date of Birth/Sex: 01/12/1946 (76 y.o. F) Treating RN: Carlene Coria Primary Care Alecsander Hattabaugh: [name] Other Clinician: Referring Kaisen Ackers: [name] Treating Luretta Everly/Extender: Skipper Cliche in Treatment: 69 Active Problems Location of Pain Severity and Description of Pain Patient Has Paino No Site Locations Pain Management and Medication Current Pain Management: Electronic Signature(s) Signed: 05/21/2022 8:36:51 AM By: Carlene Coria RN Entered By: Carlene Coria on 05/20/2022 14:24:24 [name] ([id number]) -------------------------------------------------------------------------------- Patient/Caregiver Education Details Patient Name: [name] Date of Service: 05/20/2022 2:00 PM Medical Record Number: [id number] Patient Account Number: [id number] Date of Birth/Gender: [birth year] (76 y.o. F) Treating RN: Carlene Coria Primary Care Physician: [name] Other Clinician: Referring Physician: [name] Treating Physician/Extender: Skipper Cliche in Treatment: 14 Education Assessment Education Provided To: Patient Education Topics Provided Wound/Skin Impairment: Methods: Explain/Verbal Responses: State content correctly Electronic Signature(s) Signed: 05/21/2022 8:36:51 AM By: Carlene Coria RN Entered By: Carlene Coria on 05/20/2022 14:59:22 [name]  ([id number]) -------------------------------------------------------------------------------- Wound Assessment Details Patient Name: [name] Date of Service: 05/20/2022 2:00 PM Medical Record Number: [id number] Patient Account Number: [id number] Date of Birth/Sex: Nov 23, 1945 (76 y.o. F) Treating RN: Carlene Coria Primary Care Tripp [name]: [name] Other Clinician: Referring Brynden Thune: [name] Treating Ramiya Delahunty/Extender: Skipper Cliche in Treatment: 69 Wound Status Wound Number: 4 Primary Venous Leg Ulcer Etiology: Wound Location: Right, Lateral Lower Leg Wound Open Wounding Event: Gradually Appeared Status: Date Acquired: 10/27/2021 Comorbid Cataracts, Lymphedema, Sleep Apnea, Congestive Heart Weeks Of Treatment: 29 History: Failure, Hypertension, Osteoarthritis, Neuropathy, Clustered Wound: No Received Chemotherapy, Received Radiation Photos Wound Measurements Length: (cm) 6 Width: (cm) 4 Depth: (cm) 0.1 Area: (cm) 18.85 Volume: (cm) 1.885 % Reduction in Area: -1500.2% % Reduction in Volume: -1497.5% Epithelialization: None Tunneling: No Undermining: No Wound Description Classification: Full Thickness Without Exposed Support Structures Exudate Amount: Medium Exudate Type: Serosanguineous Exudate Color: red, brown Foul Odor After Cleansing: No Slough/Fibrino Yes Wound Bed Granulation Amount: Medium (34-66%) Exposed Structure Granulation Quality: Pink Fascia Exposed: No Necrotic Amount: Medium (34-66%) Fat Layer (Subcutaneous Tissue) Exposed: Yes Necrotic Quality: Adherent Slough Tendon Exposed: No Muscle Exposed: No Joint Exposed: No Bone Exposed: No Treatment Notes Wound #4 (Lower Leg) Wound Laterality: Right, Lateral Cleanser Soap and Water Discharge Instruction: Gently cleanse wound with antibacterial soap, rinse and pat dry prior to dressing wounds Wound Cleanser ALFA, LEIBENSPERGER ([id number]) Discharge Instruction: Wash your hands with  soap and water. Remove old dressing, discard into plastic bag and place into trash. Cleanse the wound with Wound Cleanser prior to applying a clean dressing using  gauze sponges, not tissues or cotton balls. Do not scrub or use excessive force. Pat dry using gauze sponges, not tissue or cotton balls. Peri-Wound Care Topical Primary Dressing Silvercel Small 2x2 (in/in) Discharge Instruction: Apply Silvercel Small 2x2 (in/in) as instructed Secondary Dressing ABD Pad 5x9 (in/in) Discharge Instruction: Cover with ABD pad Secured With Compression Wrap 3-LAYER WRAP - Profore Lite LF 3 Multilayer Compression Bandaging System Discharge Instruction: Apply 3 multi-layer wrap as prescribed. Compression Stockings Circaid Juxta Lite Compression Wrap Quantity: 1 Right Leg Compression Amount: 20-30 mmHg Discharge Instruction: Apply Circaid Juxta Lite Compression Wrap as directed Add-Ons Electronic Signature(s) Signed: 05/21/2022 8:36:51 AM By: Carlene Coria RN Entered By: Carlene Coria on 05/20/2022 14:34:30 [name] ([id number]) -------------------------------------------------------------------------------- [place] Details Patient Name: [name] Date of Service: 05/20/2022 2:00 PM Medical Record Number: [id number] Patient Account Number: [id number] Date of Birth/Sex: [birth year] (76 y.o. F) Treating RN: Carlene Coria Primary Care Albertus Chiarelli: [name] Other Clinician: Referring Donnelle Rubey: [name] Treating Shalanda Brogden/Extender: Skipper Cliche in Treatment: 69 Vital Signs Time Taken: 14:23 Temperature (F): 97.8 Height (in): 69 Pulse (bpm): 68 Weight (lbs): 244 Respiratory Rate (breaths/min): 18 Body Mass Index (BMI): 36 Blood Pressure (mmHg): 119/55 Reference Range: 80 - 120 mg / dl Electronic Signature(s) Signed: 05/21/2022 8:36:51 AM By: Carlene Coria RN Entered By: Carlene Coria on 05/20/2022 14:24:13

## 2022-05-22 ENCOUNTER — Ambulatory Visit: Payer: Medicare Other | Admitting: Podiatry

## 2022-05-22 DIAGNOSIS — M79675 Pain in left toe(s): Secondary | ICD-10-CM

## 2022-05-22 DIAGNOSIS — B351 Tinea unguium: Secondary | ICD-10-CM

## 2022-05-22 DIAGNOSIS — M79674 Pain in right toe(s): Secondary | ICD-10-CM | POA: Diagnosis not present

## 2022-05-26 ENCOUNTER — Ambulatory Visit
Admission: RE | Admit: 2022-05-26 | Discharge: 2022-05-26 | Disposition: A | Discharge status: Home / Self Care | Payer: Medicare Other | Source: Ambulatory Visit | Attending: Nurse Practitioner | Admitting: Nurse Practitioner

## 2022-05-26 ENCOUNTER — Inpatient Hospital Stay: Payer: Medicare Other | Attending: Oncology

## 2022-05-26 DIAGNOSIS — Z79811 Long term (current) use of aromatase inhibitors: Secondary | ICD-10-CM

## 2022-05-26 DIAGNOSIS — Z1231 Encounter for screening mammogram for malignant neoplasm of breast: Secondary | ICD-10-CM | POA: Insufficient documentation

## 2022-05-27 ENCOUNTER — Encounter: Payer: Medicare Other | Admitting: Physician Assistant

## 2022-05-27 DIAGNOSIS — L97812 Non-pressure chronic ulcer of other part of right lower leg with fat layer exposed: Secondary | ICD-10-CM | POA: Diagnosis not present

## 2022-05-27 NOTE — Progress Notes (Addendum)
OMAIRA, MELLEN ([id number]) Visit Report for 05/27/2022 Chief Complaint Document Details Patient Name: [name], [name] Date of Service: 05/27/2022 2:00 PM Medical Record Number: [id number] Patient Account Number: [id number] Date of Birth/Sex: 1946/04/04 (76 y.o. F) Treating RN: Carlene Coria Primary Care Provider: [name] Other Clinician: Referring Provider: [name] Treating Provider/Extender: Skipper Cliche in Treatment: 35 Information Obtained from: Patient Chief Complaint Bilateral LE Ulcers Electronic Signature(s) Signed: 05/27/2022 2:20:48 PM By: Worthy Keeler PA-C Entered By: Worthy Keeler on 05/27/2022 14:20:48 [name] ([id number]) -------------------------------------------------------------------------------- HPI Details Patient Name: [name] Date of Service: 05/27/2022 2:00 PM Medical Record Number: [id number] Patient Account Number: [id number] Date of Birth/Sex: 30-Jul-1946 (76 y.o. F) Treating RN: Carlene Coria Primary Care Provider: [name] Other Clinician: Referring Provider: [name] Treating Provider/Extender: Skipper Cliche in Treatment: 13 History of Present Illness HPI Description: 03/15/2020 upon evaluation today patient presents today for initial evaluation here in our clinic concerning a wound that is on the left posterior lower extremity. Unfortunately this has been giving the patient some discomfort at this point which she notes has been affected her pretty much daily. The patient does have a history of venous insufficiency, hypertension, congestive heart failure, and a history of breast cancer. Fortunately there does not appear to be evidence of active infection at this time which is great news. No fevers, chills, nausea, vomiting, or diarrhea. Wound is not extremely large but does have some slough covering the surface of the wound. This is going require sharp debridement today. 03/15/2020 upon evaluation today patient  appears to be doing fairly well in regard to her wound. She does have some slough noted on the surface of the wound currently but I do believe the Iodoflex has been beneficial over the past week. There is no signs of active infection at this time which is great news and overall very pleased with the progress. No fevers, chills, nausea, vomiting, or diarrhea. 04/02/2020 upon evaluation today patient appears to be doing a little better in regard to her wound size wise this is not tremendously smaller she does have some slough buildup on the surface of the wound. With that being said this is going require some sharp debridement today to clear away some of the necrotic debris. Fortunately there is no evidence of active infection at this time. No fevers, chills, nausea, vomiting, or diarrhea. 04/10/2020 on evaluation today patient appears to be doing well with regard to her ulcer which is measuring somewhat smaller today. She actually has more granulation tissue noted which is also good news is no need for sharp debridement today. Fortunately there is no evidence of infection either which is also excellent. No fevers, chills, nausea, vomiting, or diarrhea. 04/26/2020 on evaluation today patient's wound actually is showing signs of good improvement which is great news. There does not appear to be any evidence of active infection. I do believe she is tolerating the collagen at this point. 05/03/2020 upon evaluation today patient's wound actually showed signs of good granulation at this time there does not appear to be any evidence of active infection which is great news and overall very pleased with where things stand. With that being said I do believe that the patient is can require some sharp debridement today but fortunately nothing too significant. 05/11/20 on evaluation today patient appears to be doing well in regard to her leg ulcer. She's been tolerating the dressing changes without complication.  Fortunately there is no signs of active infection at this time.  Overall I'm very pleased with where things stand. 05/18/2020 upon evaluation today patient's wound is showing signs of improvement is very dry and I think the collagen for the most part just dried to the wound bed. I am going to clean this away with sharp debridement in order to get this under control in my opinion. 05/25/2020 upon evaluation today patient actually appears to be doing well in regard to her leg ulcer. In fact upon inspection it appears she could potentially be completely healed but that is not guaranteed based on what we are seeing. There does not appear to be any signs of active infection which is great news. 05/31/2020 on evaluation today patient appears to be doing well with regard to her wounds. She in fact appears to be almost completely healed on the left leg there is just a very small opening remaining point 1 06/08/2020 upon evaluation today patient appears to be doing excellent in regard to her leg ulcer on the left in fact it appears to be that she is completely healed as of today. I see no signs of active infection at this time which is great news. No fevers, chills, nausea, vomiting, or diarrhea. READMISSION 01/16/2021 Patient that we have had in this clinic with a wound on the left posterior calf in the summer 2021 extending into October. She was felt to have chronic venous insufficiency. Per the patient's description she was discharged with what sounds like external compression stockings although she could not afford the "$75 per leg". She therefore did not get anything. Apparently her wound that she has currently is been in existence since January. She has been followed at vein and vascular with Unna boots and I think calcium alginate. She had ABIs done on 06/25/2020 that showed an noncompressible ABI on the right leg and the left leg but triphasic waveforms with good great toe pressures and a TBI of 0.90 on the  right and 0.91 on the left Surprisingly the patient also had venous reflux studies that were really unremarkable. This included no evidence of DVTs or SVTs but there was no evidence of deep venous insufficiency or superficial venous insufficiency in the greater saphenous or short saphenous veins bilaterally. I would wonder about more central venous issues or perhaps this is all lymphedema 01/25/2021 upon evaluation today patient appears to be doing decently well and guard to her wound. The good news is the Iodoflex that is job she saw Dr. Dellia Nims last week for readmission and to be honest I think that this has done extremely well over that week. I do believe that she can tolerate the sharp debridement today which will be good. I think that cleaning off the wound will likely allow [place] to be able to use a different type of dressing I do not think the Iodoflex will even be necessary based on how clean the wound looks. Fortunately there is no sign of active infection at this time which is great news. No fevers, chills, nausea, vomiting, or diarrhea. 02/12/2021 upon evaluation today patient appears to be doing well with regard to her leg ulcer. We did switch to alginate when she came for nurse visit I think is doing much better for her. Fortunately there does not appear to be any signs of active infection at this time which is great news. No fevers, chills, nausea, vomiting, or diarrhea. [name], [name] ([id number]) 02/18/2021 upon evaluation today patient's wound is actually showing signs of good improvement. I am very pleased with where things stand  currently. I do not see any signs of active infection which is great news and overall I feel like she is making good progress. The patient likewise is happy that things are doing so well and that this is measuring somewhat smaller. 02/25/2021 upon evaluation today patient actually appears to be doing quite well in regard to her wounds. She has been tolerating the  dressing changes without complication. Fortunately there does not appear to be any signs of active infection which is great news and overall I am extremely pleased with where things stand. No fevers, chills, nausea, vomiting, or diarrhea. She does have a lot of edema I really do think she needs compression socks to be worn in order to prevent things from causing additional issues for her currently. Especially on the right leg she should be wearing compression socks all the time to be honest. 03/04/2021 upon evaluation today patient's leg ulcer actually appears to be doing pretty well which is great news. There does not appear to be any significant change but overall the appearance is better even though the size is not necessarily reflecting a great improvement. Fortunately there does not appear to be any signs of active infection. No fevers, chills, nausea, vomiting, or diarrhea. 03/12/2021 upon evaluation today patient appears to be doing well with regard to her wounds. She has been tolerating the dressing changes without complication. The good news is that the wound on her left lateral leg is doing much better and is showing signs of good improvement. Overall her swelling is controlled well as well. She does need some compression socks she plans to go Jefferey socks next week. 03/18/2021 on evaluation today patient's wound actually is showing signs of good improvement. She does have a little bit of slough this can require some sharp debridement today. 03/25/2021 upon evaluation today patient appears to be doing well with regard to her wound on the left lateral leg. She has been tolerating the dressing changes without complication. Fortunately there does not appear to be any signs of active infection at this time. No fever chills no 04/01/2021 upon evaluation today patient's wound is showing signs of improvement and overall very pleased with where things stand at this point. There is no evidence of active  infection which is great news as well and in general I think that she is making great progress. 04/09/2021 upon evaluation today patient appears to be doing well with regard to her ankle ulcer. She is making good progress currently which is great news. There does not appear to be any signs of infection also excellent news. In general I am extremely pleased with where things stand today. No fevers, chills, nausea, vomiting, or diarrhea. 04/23/2021 upon inspection today patient appears to be doing quite well with regard to her leg ulcer. She has been tolerating the dressing changes without complication. She is can require some sharp debridement today but overall seems to be doing excellent. 05/06/2021 upon evaluation today patient unfortunately appears to be doing poorly overall in regard to her swelling. She is actually significantly more swollen than last week despite the fact that her wound is doing better this is not a good trend. I think that she probably needs to see her cardiologist ASAP and I discussed that with her today. This includes the fact that honestly I think she probably is getting need an increase in her diuretic or something to try to clear away some of the excess fluid that she is experiencing at the moment. She is  not been doing her pumps even twice a day but again right now more concerned that if she were to do the pump she would fluid overload her lungs causing other issues as far as her breathing is concerned. Obviously I think she needs to contact them and move up the appointment for the 12th of something sooner 2 weeks is a bit too long to wait with how swollen she has. 05/14/2021 upon evaluation today patient's wound actually showing signs of being a little bit smaller compared to previous. She has been making good progress however. Fortunately there does not appear to be any evidence of active infection which is great. Overall I am extremely pleased in that regard. Nonetheless I am  still concerned that she is having quite a bit of issue as far as the swelling is concerned she has been placed on fluid restriction as well as an increase in the furosemide by her doctor. We will see how things progress. 05/21/2021 upon evaluation today patient appears to be doing well with regard to her wound in general. She has been tolerating the dressing changes without complication. With that being said she has been using silver cell for a while and while this was showing signs of improvement it is now gotten to the point where this has slowed down quite significantly. I do believe that switching to a different dressing may be beneficial for her today. 05/28/2021 upon evaluation today patient actually appears to be doing quite well in regard to her wound. In fact there is really not any need for sharp debridement today based on what I see. The Hydrofera Blue is doing great and overall I think that she is managing quite nicely with the compression wrapping. Her edema is still down quite a bit with the wrapping in place. 06/11/2021 upon evaluation today patient appears to be doing well with regard to her leg ulcer. She has been tolerating the dressing changes without complication. Fortunately there does not appear to be any signs of active infection at this time which is great news. No fevers, chills, nausea, vomiting, or diarrhea. She is going require some sharp debridement today. 10/11; left leg ulcer much better looking and with improved surface area. She is using Hydrofera Blue under 3 layer compression. She does not have any stocking on the right leg which in itself has very significant nonpitting edema 06/25/2021 upon evaluation today patient appears to be doing well with regard to the wound on her left leg. She has been tolerating the dressing changes without complication and overall I am extremely pleased with where things stand today. I do not see any evidence of infection which is great  news. 07/02/2021 upon evaluation today patient's wound actually showing signs of excellent improvement and actually very pleased with where we stand today and the overall appearance. Fortunately there does not appear to be any signs of active infection. No fevers, chills, nausea, vomiting, or diarrhea. 07/09/2021 upon evaluation today patient appears to be doing better in regard to her wound this is measuring smaller and she is headed in the appropriate direction based on what I am seeing currently. I do not see any evidence of active infection systemically which is great news. 07/16/2021 upon evaluation today patient appears to be doing pretty well currently in regard to her leg ulcer. This is measuring smaller and looking much better this is great news. Fortunately there does not appear to be any evidence of active infection systemically which is great news as well.  07/23/2021 upon evaluation today patient's wound is actually showing signs of good improvement this is definitely measuring smaller. I do not see any signs of infection which is great news and overall very pleased in that regard. Overall I think that she is doing well with the Avigayil Ton, [place] ([id number]) [place]. 07/30/2020 upon evaluation today patient appears to be doing well in regard to her wound. She has been tolerating the dressing changes without complication think the Hydrofera Blue at this point is actually causing her to dry out a lot as far as the wound bed is concerned. Subsequently I think that we need to try to see what we can do to improve this. Fortunately there does not appear to be any evidence of active infection at this time which is great news. 08/06/2021 upon evaluation today patient appears to be doing well with regards to her wound. Is not measuring significantly smaller initial inspection but upon closer inspection she has a lot of new skin growth across the central portion of the wound. I think will  get very close to complete resolution. 08/13/2021 upon evaluation today patient appears to be doing well with regard to her wound. In fact this appears to be I believe healed although there is still some question as to whether it is completely so. I am concerned about the fact that to be honest she still has some evidence of dry skin that could be just trapping something underneath I still want to debride anything as I am afraid of causing some damage to the good skin for that reason I Georgina Peer probably monitor for 1 more week before completely closing everything out. 08/20/2021 upon evaluation today patient actually appears to be doing excellent. In fact she appears to be completely healed. This was the case last week as well we just wanted to make sure nothing reopened since she does not really have an ability to put on compression socks this is good to be something we need to make sure was well-healed. Nonetheless I think we have achieved that goal as of today. 12/27; this is a patient we discharged 2 weeks ago with wounds on her left lower leg laterally. She was supposed to get stockings and really did not get them. She developed blistering and reopening probably sometime late last week. She has 3 areas in the same area as previously. The mid calf dimensions on the left went from 39 to 51 cm today. She also has very significant edema on the right leg but as far as I am aware has not had wounds in this area 1/4; patient presents for follow-up. She has tolerated the compression wrap well. She has no issues or complaints today. 09/17/2021 upon evaluation today patient appears to be doing well with regard to her wounds. She has been tolerating the dressing changes without complication. Fortunately there does not appear to be any signs of active infection at this time. No fevers, chills, nausea, vomiting, or diarrhea. 10/01/2021 upon evaluation today patient appears to be doing well with regard to her  wound this is actually measuring better and looking smaller and I am very pleased in that regard. Fortunately I do not see any evidence of active infection locally nor systemically at this point. No fevers, chills, nausea, vomiting, or diarrhea. 10/08/2021 upon evaluation patient's wounds are actually showing some signs of improvement measuring a little bit smaller and looking a little bit better. Overall I do not think there is any need for sharp debridement today which  is good news. No fevers, chills, nausea, vomiting, or diarrhea. 10/14/2021 upon evaluation today patient appears to be doing well with regard to her wound. She has been tolerating the dressing changes without complication and overall I am extremely pleased with where things stand today. There is a little bit of film on the surface of the wounds but this was carefully cleaned away with just saline and gauze she really did not want me to do any sharp debridement today. 10/29/2021 upon evaluation today patient appears to be doing quite well in regard to her wounds. She is actually showing signs of excellent improvement which is great news and overall I feel like we are on the right track at this time. She does have a wound on both legs and she does need something compression wise when she heals for that reason we will get a go and see about ordering her bilateral juxta lite compression wraps. 11/05/2021 upon evaluation today patient appears to be doing well with regard to her wound. She has been tolerating the dressing changes without complication. Fortunately I do not see any evidence of active infection locally nor systemically at this time which is great news. No fevers, chills, nausea, vomiting, or diarrhea. 11/12/2021 upon evaluation today patient appears to be doing well with regard to her wounds. She is doing excellent on the left on the right this is not doing quite as well there is some slough and biofilm buildup. 3/14; patient  presents for follow-up. She has no issues or complaints today. She has tolerated the compression wraps well. 11/26/2021 upon evaluation today patient appears to be doing well with regard to her wounds. Both are showing signs of good improvement which is great news. I do not see any evidence of active infection and overall think she is healing quite nicely. 12/03/2021 upon evaluation today patient appears to be doing well with regard to her wounds. Both are showing signs of excellent improvement I am very pleased with where things stand and I think that she is making good progress here. Fortunately I do not see any signs of active infection locally or systemically which is great news. No fevers, chills, nausea, vomiting, or diarrhea. 12-10-2021 upon evaluation today patient's wounds actually appear to be doing awesome. I am extremely pleased with where we stand I think she is making excellent progress. I do not see any signs of active infection locally or systemically at this time. 12-17-2021 upon evaluation today patient's wounds are actually showing signs of good improvement. Fortunately I do not see any evidence of active infection locally nor systemically which is great news. Overall I do not believe that she is showing any signs of significant worsening which is good news. With that being said I do believe that she has a little bit of film on the surface of the wound I was actually able to clear this away with saline and gauze no sharp debridement was necessary. 12-24-2021 upon evaluation today patient appears to be doing well with regard to her wounds. I do not see any signs of active infection currently which is great news and overall I think that we are on the right track here. No fevers, chills, nausea, vomiting, or diarrhea. 12-31-2021 upon evaluation today patient appears to be doing well with regard to her wounds. Both are showing signs of improvement the wound on the right leg is going require  some sharp debridement on the left seems to be doing quite well. 01-07-2022 upon evaluation today patient  appears to be doing well currently in regard to her wounds. She is actually making good progress bilaterally. Both of them require little bit of sharp debridement but not too much which is great news. [place], [place] ([id number]) Upon inspection patient's wound bed actually showed signs of good granulation and epithelization on both legs. She still is having significant issues with the right leg compared to the left although I do feel like both are actually showing signs of pretty good improvement. 01-21-2022 upon evaluation today patient's wound is actually showing signs of excellent improvement I am very pleased with where we stand currently. I do not see any signs of active infection locally or systemically which is great news. No fevers, chills, nausea, vomiting, or diarrhea. 01-28-2022 upon evaluation today patient appears to be doing well with regard to her wound on the left leg this is pretty much just about healed which is great news. Unfortunately in regard to the wound on her right leg this is not doing nearly as well in fact I think we probably need to see about a culture today that was discussed with her. 02-04-2022 upon evaluation today patient appears to be doing well with regard to her left leg which in fact might actually be completely healed I am not 100% sure. Nonetheless this is shown signs of excellent improvement which is great news. With that being said in regards to the right leg there is some slough and film buildup on the surface of the wound although she is feeling much better than last week this does appear to be doing much better than it was last week. Fortunately there does not appear to be any signs of active infection locally or systemically at this time. 02-11-2022 upon evaluation today patient's wounds appear to be doing decently well. In fact the left leg is healed  although there is brand-new skin were definitely given still need to wrap her for the time being. On the right leg this is showing signs of some epithelial growth in the middle of the wound it still measures the same because there is still a larger area with speckled openings but again as far as the entire area being open this is significantly improved which is great news. I am very pleased. 6/13; left leg remains healed and she has a juxta light to apply to it today. There is some concern about whether she is going to be able to do this at home. She lives with a daughter although she has her own disability. On the right the wound looks smaller to me nice epithelialization good edema control. 02-25-2022 upon evaluation today patient appears to be doing well with regard to her legs the left leg is still healed the right leg is doing much better. I think we are on the right track here. With that being said unfortunately she continues to have significant issues with some other problems and she actually tells me on Saturday she was having lunch and went inside to sit down when she tells me she had all of a sudden a sharp sensation that went from her hand through her arm into her leg and her neck and she tells me that she could not see anything for the next hour it was completely black. Slowly after this her vision started to return and has been normal since. It sounds to me as if she may have had a TIA or mini stroke at this point. Nonetheless I think that she needs to have this  checked out ASAP. Her primary care provider is Dr. Clide Deutscher at [facility]. We will get a try to get in touch with him. 03-10-2022 upon evaluation today patient's wound actually showed signs of good improvement. She seems to be making excellent progress here. I do not see any evidence of active infection locally or systemically which is great news. No fevers, chills, nausea, vomiting, or diarrhea. She was in the hospital since have  seen her last she ended up finding out that she was having syncopal episodes. That is what was going on with the blacking out and losing vision. Subsequently they adjusted some of her medication she seems to be doing better. 03-18-2022 upon evaluation today patient's wound is actually showing signs of good granulation and epithelization at this point. Fortunately I do not see any signs of active infection locally or systemically which is great news. 03-25-2022 any evidence of active infection locally or systemically which is great news. Any evidence of active infection which is great news and overall the wound is looking better. Upon evaluation today patient appears to be doing well with regard to her leg ulcer. She has been tolerating the dressing changes without complication. Fortunately I do not see 7/25; the patient's wound is on the right lateral lower extremity using silver alginate 3 layer compression. She has chronic venous insufficiency and lymphedema. We are using a juxta lite on the left leg which does not have any open wounds but she is leaving this on all week it seems that there just is not a way to get this changed at home 04-08-2022 upon evaluation today patient appears to be doing well currently in regard to her wound. She has been tolerating the dressing changes without complication. Fortunately I do not see any evidence of active infection locally or systemically which is great news. No fever or chills noted her left leg has reopened at this point. 04-15-2022 upon evaluation patient's wounds actually are showing signs of improvement which is great news. Little by little I do believe her making progress here. In regard to the right leg this is looking better though the measurement does not speak to it there is a lot of new skin growth. On the left leg this is significantly smaller compared to last week most of the blistered area has completely closed. 04-22-2022 upon evaluation today  patient appears to be doing excellent in regard to her wounds both are showing signs of improvement. Her knees and above are still extremely swollen the legs were rewrapped them are down significantly. With that being said I do not think that we will get a be able to keep them that way once were not rapid noted that she really has no way to be able to put juxta lites on and off and nobody to help her with that. That is the biggest concern that we have here at this point. Fortunately I do not think that there is any evidence of infection which is great news. 04-29-2022 upon evaluation today patient appears to be doing well with regard to her wounds. She has been tolerating the dressing changes without complication. Fortunately there does not appear to be any evidence of infection locally or systemically which is great news and overall I am extremely pleased in that regard. Left leg is healed the right leg is very close we discussed today the possibility of her granddaughter helping with putting on and off compression wraps for her. 05-06-2022 upon evaluation today patient appears to be doing  well currently in regard to her left leg which is showing signs of being completely healed. With regard to her right leg she still has an open area though this is doing much better there is a lot less drainage than what we previously noted. Fortunately I see no evidence of active infection locally or systemically at this time which is great news. 05-20-2022 upon evaluation today patient appears to be doing excellent in regard to her leg. The left leg is still healed the right leg is much better. Fortunately I see no signs of infection locally or systemically at this time which is great news. No fevers, chills, nausea, vomiting, or diarrhea. 05-27-2022 upon evaluation today patient appears to be doing excellent in regard to her wound this is actually measuring smaller and looking much better. Fortunately I see no signs  of active infection locally or systemically at this time. Electronic Signature(s) LAURIAN, EDRINGTON ([id number]) Signed: 05/27/2022 2:38:50 PM By: Worthy Keeler PA-C Entered By: Worthy Keeler on 05/27/2022 14:38:50 [name], [name] ([id number]) -------------------------------------------------------------------------------- Physical Exam Details Patient Name: [name] Date of Service: 05/27/2022 2:00 PM Medical Record Number: [id number] Patient Account Number: [id number] Date of Birth/Sex: [birth year] (76 y.o. F) Treating RN: Carlene Coria Primary Care Provider: [name] Other Clinician: Referring Provider: [name] Treating Provider/Extender: Skipper Cliche in Treatment: 60 Constitutional Well-nourished and well-hydrated in no acute distress. Respiratory normal breathing without difficulty. Psychiatric this patient is able to make decisions and demonstrates good insight into disease process. Alert and Oriented x 3. pleasant and cooperative. Notes Upon inspection patient's wound bed showed evidence of good granulation and epithelization at this point. I do not see any evidence of anything worsening which is great news and overall I am extremely pleased with where we stand today. Electronic Signature(s) Signed: 05/27/2022 2:39:03 PM By: Worthy Keeler PA-C Entered By: Worthy Keeler on 05/27/2022 14:39:03 [name] ([id number]) -------------------------------------------------------------------------------- Physician Orders Details Patient Name: [name] Date of Service: 05/27/2022 2:00 PM Medical Record Number: [id number] Patient Account Number: [id number] Date of Birth/Sex: [birth year] (76 y.o. F) Treating RN: Carlene Coria Primary Care Provider: [name] Other Clinician: Referring Provider: [name] Treating Provider/Extender: Skipper Cliche in Treatment: 5 Verbal / Phone Orders: No Diagnosis Coding Follow-up Appointments o  Return Appointment in 1 week. o Nurse Visit as needed Bathing/ Shower/ Hygiene o May shower with wound dressing protected with water repellent cover or cast protector. o No tub bath. Anesthetic (Use 'Patient Medications' Section for Anesthetic Order Entry) o Lidocaine applied to wound bed Edema Control - Lymphedema / Segmental Compressive Device / Other Bilateral Lower Extremities o Optional: One layer of unna paste to top of compression wrap (to act as an anchor). - she needs this o 3 Layer Compression System for Lymphedema. - left o Elevate, Exercise Daily and Avoid Standing for Long Periods of Time. o Elevate legs to the level of the heart and pump ankles as often as possible o Elevate leg(s) parallel to the floor when sitting. o Compression Pump: Use compression pump on left lower extremity for 60 minutes, twice daily. - You may pump over wraps o Compression Pump: Use compression pump on right lower extremity for 60 minutes, twice daily. - You may pump over wraps Additional Orders / Instructions o Follow Nutritious Diet and Increase Protein Intake Wound Treatment Wound #4 - Lower Leg Wound Laterality: Right, Lateral Cleanser: Soap and Water 1 x Per Week/30 Days Discharge Instructions: Gently cleanse wound with antibacterial soap, rinse  and pat dry prior to dressing wounds Cleanser: Wound Cleanser 1 x Per Week/30 Days Discharge Instructions: Wash your hands with soap and water. Remove old dressing, discard into plastic bag and place into trash. Cleanse the wound with Wound Cleanser prior to applying a clean dressing using gauze sponges, not tissues or cotton balls. Do not scrub or use excessive force. Pat dry using gauze sponges, not tissue or cotton balls. Primary Dressing: Silvercel Small 2x2 (in/in) 1 x Per Week/30 Days Discharge Instructions: Apply Silvercel Small 2x2 (in/in) as instructed Secondary Dressing: ABD Pad 5x9 (in/in) 1 x Per Week/30  Days Discharge Instructions: Cover with ABD pad Compression Wrap: 3-LAYER WRAP - Profore Lite LF 3 Multilayer Compression Bandaging System 1 x Per Week/30 Days Discharge Instructions: Apply 3 multi-layer wrap as prescribed. Compression Stockings: Circaid Juxta Lite Compression Wrap Right Leg Compression Amount: 20-30 mmHG Discharge Instructions: Apply Circaid Juxta Lite Compression Wrap as directed Electronic Signature(s) Signed: 05/27/2022 5:18:45 PM By: Worthy Keeler PA-C Signed: 05/30/2022 9:44:45 AM By: Carlene Coria RN Entered By: Carlene Coria on 05/27/2022 14:23:34 [name] ([id number]) -------------------------------------------------------------------------------- Problem List Details Patient Name: [name] Date of Service: 05/27/2022 2:00 PM Medical Record Number: [id number] Patient Account Number: [id number] Date of Birth/Sex: 1946/04/06 (76 y.o. 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/07/2022 No Yes exposed Inactive Problems ICD-10 Code Description Active Date Inactive Date L97.822 Non-pressure chronic ulcer of other part of left lower leg with fat layer exposed 01/16/2021 01/16/2021 Resolved Problems Electronic Signature(s) Signed: 05/27/2022 2:20:45 PM By: Worthy Keeler PA-C Entered By: Worthy Keeler on 05/27/2022 14:20:45 [name] ([id number]) -------------------------------------------------------------------------------- Progress Note Details Patient Name: [name] Date of Service: 05/27/2022 2:00 PM Medical Record Number: [id number] Patient Account Number: [id number] Date of Birth/Sex: 04-08-1946 (76 y.o. F) Treating RN: Carlene Coria Primary Care Provider: [name] Other Clinician: Referring Provider: [name] Treating Provider/Extender: Skipper Cliche in Treatment: 21 Subjective Chief Complaint Information obtained from Patient Bilateral LE Ulcers History of Present Illness (HPI) 03/15/2020 upon evaluation today patient presents today for initial evaluation here in our clinic concerning a wound that is on the left posterior lower extremity. Unfortunately this has been giving the patient some discomfort at this point which she notes has been affected her pretty much daily. The patient does have a history of venous insufficiency, hypertension, congestive heart failure, and a history of breast cancer. Fortunately there does not appear to be evidence of active infection at this time which is great news. No fevers, chills, nausea, vomiting, or diarrhea. Wound is not extremely large but does have some slough covering the surface of the wound. This is going require sharp debridement today. 03/15/2020 upon evaluation today patient appears to be doing fairly well in regard to her wound. She does have some slough noted on the surface of the wound currently but I do believe the Iodoflex has been beneficial over the past week. There is no signs of active infection at this time which is great news and overall very pleased with the progress. No fevers, chills, nausea, vomiting, or diarrhea. 04/02/2020 upon evaluation today patient appears  to be doing a little better in regard to her wound size wise this is not tremendously smaller she does have some slough buildup on the surface of the wound. With that being said this is going require some sharp debridement today to clear away some of the necrotic debris. Fortunately there is no evidence of active  infection at this time. No fevers, chills, nausea, vomiting, or diarrhea. 04/10/2020 on evaluation today patient appears to be doing well with regard to her ulcer which is measuring somewhat smaller today. She actually has more granulation tissue noted which is also good news is no need for sharp debridement today. Fortunately there is no evidence of infection either which is also excellent. No fevers, chills, nausea, vomiting, or diarrhea. 04/26/2020 on evaluation today patient's wound actually is showing signs of good improvement which is great news. There does not appear to be any evidence of active infection. I do believe she is tolerating the collagen at this point. 05/03/2020 upon evaluation today patient's wound actually showed signs of good granulation at this time there does not appear to be any evidence of active infection which is great news and overall very pleased with where things stand. With that being said I do believe that the patient is can require some sharp debridement today but fortunately nothing too significant. 05/11/20 on evaluation today patient appears to be doing well in regard to her leg ulcer. She's been tolerating the dressing changes without complication. Fortunately there is no signs of active infection at this time. Overall I'm very pleased with where things stand. 05/18/2020 upon evaluation today patient's wound is showing signs of improvement is very dry and I think the collagen for the most part just dried to the wound bed. I am going to clean this away with sharp debridement in order to get this under control in my opinion. 05/25/2020 upon evaluation today patient actually appears to be doing well in regard to her leg ulcer. In fact upon inspection it appears she could potentially be completely healed but that is not guaranteed based on what we are seeing. There does not appear to be any signs of active infection which is great news. 05/31/2020 on evaluation today patient  appears to be doing well with regard to her wounds. She in fact appears to be almost completely healed on the left leg there is just a very small opening remaining point 1 06/08/2020 upon evaluation today patient appears to be doing excellent in regard to her leg ulcer on the left in fact it appears to be that she is completely healed as of today. I see no signs of active infection at this time which is great news. No fevers, chills, nausea, vomiting, or diarrhea. READMISSION 01/16/2021 Patient that we have had in this clinic with a wound on the left posterior calf in the summer 2021 extending into October. She was felt to have chronic venous insufficiency. Per the patient's description she was discharged with what sounds like external compression stockings although she could not afford the "$75 per leg". She therefore did not get anything. Apparently her wound that she has currently is been in existence since January. She has been followed at vein and vascular with Unna boots and I think calcium alginate. She had ABIs done on 06/25/2020 that showed an noncompressible ABI on the right leg and the left leg but triphasic waveforms with good great toe pressures and a TBI of 0.90 on the right and 0.91 on the left Surprisingly the  patient also had venous reflux studies that were really unremarkable. This included no evidence of DVTs or SVTs but there was no evidence of deep venous insufficiency or superficial venous insufficiency in the greater saphenous or short saphenous veins bilaterally. I would wonder about more central venous issues or perhaps this is all lymphedema 01/25/2021 upon evaluation today patient appears to be doing decently well and guard to her wound. The good news is the Iodoflex that is job she saw Dr. Dellia Nims last week for readmission and to be honest I think that this has done extremely well over that week. I do believe that she can tolerate the sharp debridement today which will be  good. I think that cleaning off the wound will likely allow [place] to be able to use a different type of dressing I do not think the Iodoflex will even be necessary based on how clean the wound looks. Fortunately there is no sign of active infection at this time which is great news. No fevers, chills, nausea, vomiting, or diarrhea. [name], [name] ([id number]) 02/12/2021 upon evaluation today patient appears to be doing well with regard to her leg ulcer. We did switch to alginate when she came for nurse visit I think is doing much better for her. Fortunately there does not appear to be any signs of active infection at this time which is great news. No fevers, chills, nausea, vomiting, or diarrhea. 02/18/2021 upon evaluation today patient's wound is actually showing signs of good improvement. I am very pleased with where things stand currently. I do not see any signs of active infection which is great news and overall I feel like she is making good progress. The patient likewise is happy that things are doing so well and that this is measuring somewhat smaller. 02/25/2021 upon evaluation today patient actually appears to be doing quite well in regard to her wounds. She has been tolerating the dressing changes without complication. Fortunately there does not appear to be any signs of active infection which is great news and overall I am extremely pleased with where things stand. No fevers, chills, nausea, vomiting, or diarrhea. She does have a lot of edema I really do think she needs compression socks to be worn in order to prevent things from causing additional issues for her currently. Especially on the right leg she should be wearing compression socks all the time to be honest. 03/04/2021 upon evaluation today patient's leg ulcer actually appears to be doing pretty well which is great news. There does not appear to be any significant change but overall the appearance is better even though the size is not  necessarily reflecting a great improvement. Fortunately there does not appear to be any signs of active infection. No fevers, chills, nausea, vomiting, or diarrhea. 03/12/2021 upon evaluation today patient appears to be doing well with regard to her wounds. She has been tolerating the dressing changes without complication. The good news is that the wound on her left lateral leg is doing much better and is showing signs of good improvement. Overall her swelling is controlled well as well. She does need some compression socks she plans to go Jefferey socks next week. 03/18/2021 on evaluation today patient's wound actually is showing signs of good improvement. She does have a little bit of slough this can require some sharp debridement today. 03/25/2021 upon evaluation today patient appears to be doing well with regard to her wound on the left lateral leg. She has been tolerating the dressing changes without  complication. Fortunately there does not appear to be any signs of active infection at this time. No fever chills no 04/01/2021 upon evaluation today patient's wound is showing signs of improvement and overall very pleased with where things stand at this point. There is no evidence of active infection which is great news as well and in general I think that she is making great progress. 04/09/2021 upon evaluation today patient appears to be doing well with regard to her ankle ulcer. She is making good progress currently which is great news. There does not appear to be any signs of infection also excellent news. In general I am extremely pleased with where things stand today. No fevers, chills, nausea, vomiting, or diarrhea. 04/23/2021 upon inspection today patient appears to be doing quite well with regard to her leg ulcer. She has been tolerating the dressing changes without complication. She is can require some sharp debridement today but overall seems to be doing excellent. 05/06/2021 upon evaluation today  patient unfortunately appears to be doing poorly overall in regard to her swelling. She is actually significantly more swollen than last week despite the fact that her wound is doing better this is not a good trend. I think that she probably needs to see her cardiologist ASAP and I discussed that with her today. This includes the fact that honestly I think she probably is getting need an increase in her diuretic or something to try to clear away some of the excess fluid that she is experiencing at the moment. She is not been doing her pumps even twice a day but again right now more concerned that if she were to do the pump she would fluid overload her lungs causing other issues as far as her breathing is concerned. Obviously I think she needs to contact them and move up the appointment for the 12th of something sooner 2 weeks is a bit too long to wait with how swollen she has. 05/14/2021 upon evaluation today patient's wound actually showing signs of being a little bit smaller compared to previous. She has been making good progress however. Fortunately there does not appear to be any evidence of active infection which is great. Overall I am extremely pleased in that regard. Nonetheless I am still concerned that she is having quite a bit of issue as far as the swelling is concerned she has been placed on fluid restriction as well as an increase in the furosemide by her doctor. We will see how things progress. 05/21/2021 upon evaluation today patient appears to be doing well with regard to her wound in general. She has been tolerating the dressing changes without complication. With that being said she has been using silver cell for a while and while this was showing signs of improvement it is now gotten to the point where this has slowed down quite significantly. I do believe that switching to a different dressing may be beneficial for her today. 05/28/2021 upon evaluation today patient actually appears to  be doing quite well in regard to her wound. In fact there is really not any need for sharp debridement today based on what I see. The Hydrofera Blue is doing great and overall I think that she is managing quite nicely with the compression wrapping. Her edema is still down quite a bit with the wrapping in place. 06/11/2021 upon evaluation today patient appears to be doing well with regard to her leg ulcer. She has been tolerating the dressing changes without complication. Fortunately there does  not appear to be any signs of active infection at this time which is great news. No fevers, chills, nausea, vomiting, or diarrhea. She is going require some sharp debridement today. 10/11; left leg ulcer much better looking and with improved surface area. She is using Hydrofera Blue under 3 layer compression. She does not have any stocking on the right leg which in itself has very significant nonpitting edema 06/25/2021 upon evaluation today patient appears to be doing well with regard to the wound on her left leg. She has been tolerating the dressing changes without complication and overall I am extremely pleased with where things stand today. I do not see any evidence of infection which is great news. 07/02/2021 upon evaluation today patient's wound actually showing signs of excellent improvement and actually very pleased with where we stand today and the overall appearance. Fortunately there does not appear to be any signs of active infection. No fevers, chills, nausea, vomiting, or diarrhea. 07/09/2021 upon evaluation today patient appears to be doing better in regard to her wound this is measuring smaller and she is headed in the appropriate direction based on what I am seeing currently. I do not see any evidence of active infection systemically which is great news. 07/16/2021 upon evaluation today patient appears to be doing pretty well currently in regard to her leg ulcer. This is measuring smaller  and looking much better this is great news. Fortunately there does not appear to be any evidence of active infection systemically which is great news [name], [name] ([id number]) as well. 07/23/2021 upon evaluation today patient's wound is actually showing signs of good improvement this is definitely measuring smaller. I do not see any signs of infection which is great news and overall very pleased in that regard. Overall I think that she is doing well with the [facility]. 07/30/2020 upon evaluation today patient appears to be doing well in regard to her wound. She has been tolerating the dressing changes without complication think the Hydrofera Blue at this point is actually causing her to dry out a lot as far as the wound bed is concerned. Subsequently I think that we need to try to see what we can do to improve this. Fortunately there does not appear to be any evidence of active infection at this time which is great news. 08/06/2021 upon evaluation today patient appears to be doing well with regards to her wound. Is not measuring significantly smaller initial inspection but upon closer inspection she has a lot of new skin growth across the central portion of the wound. I think will get very close to complete resolution. 08/13/2021 upon evaluation today patient appears to be doing well with regard to her wound. In fact this appears to be I believe healed although there is still some question as to whether it is completely so. I am concerned about the fact that to be honest she still has some evidence of dry skin that could be just trapping something underneath I still want to debride anything as I am afraid of causing some damage to the good skin for that reason I Georgina Peer probably monitor for 1 more week before completely closing everything out. 08/20/2021 upon evaluation today patient actually appears to be doing excellent. In fact she appears to be completely healed. This was the case last  week as well we just wanted to make sure nothing reopened since she does not really have an ability to put on compression socks this is good to be something  we need to make sure was well-healed. Nonetheless I think we have achieved that goal as of today. 12/27; this is a patient we discharged 2 weeks ago with wounds on her left lower leg laterally. She was supposed to get stockings and really did not get them. She developed blistering and reopening probably sometime late last week. She has 3 areas in the same area as previously. The mid calf dimensions on the left went from 39 to 51 cm today. She also has very significant edema on the right leg but as far as I am aware has not had wounds in this area 1/4; patient presents for follow-up. She has tolerated the compression wrap well. She has no issues or complaints today. 09/17/2021 upon evaluation today patient appears to be doing well with regard to her wounds. She has been tolerating the dressing changes without complication. Fortunately there does not appear to be any signs of active infection at this time. No fevers, chills, nausea, vomiting, or diarrhea. 10/01/2021 upon evaluation today patient appears to be doing well with regard to her wound this is actually measuring better and looking smaller and I am very pleased in that regard. Fortunately I do not see any evidence of active infection locally nor systemically at this point. No fevers, chills, nausea, vomiting, or diarrhea. 10/08/2021 upon evaluation patient's wounds are actually showing some signs of improvement measuring a little bit smaller and looking a little bit better. Overall I do not think there is any need for sharp debridement today which is good news. No fevers, chills, nausea, vomiting, or diarrhea. 10/14/2021 upon evaluation today patient appears to be doing well with regard to her wound. She has been tolerating the dressing changes without complication and overall I am extremely  pleased with where things stand today. There is a little bit of film on the surface of the wounds but this was carefully cleaned away with just saline and gauze she really did not want me to do any sharp debridement today. 10/29/2021 upon evaluation today patient appears to be doing quite well in regard to her wounds. She is actually showing signs of excellent improvement which is great news and overall I feel like we are on the right track at this time. She does have a wound on both legs and she does need something compression wise when she heals for that reason we will get a go and see about ordering her bilateral juxta lite compression wraps. 11/05/2021 upon evaluation today patient appears to be doing well with regard to her wound. She has been tolerating the dressing changes without complication. Fortunately I do not see any evidence of active infection locally nor systemically at this time which is great news. No fevers, chills, nausea, vomiting, or diarrhea. 11/12/2021 upon evaluation today patient appears to be doing well with regard to her wounds. She is doing excellent on the left on the right this is not doing quite as well there is some slough and biofilm buildup. 3/14; patient presents for follow-up. She has no issues or complaints today. She has tolerated the compression wraps well. 11/26/2021 upon evaluation today patient appears to be doing well with regard to her wounds. Both are showing signs of good improvement which is great news. I do not see any evidence of active infection and overall think she is healing quite nicely. 12/03/2021 upon evaluation today patient appears to be doing well with regard to her wounds. Both are showing signs of excellent improvement I am very pleased  with where things stand and I think that she is making good progress here. Fortunately I do not see any signs of active infection locally or systemically which is great news. No fevers, chills, nausea, vomiting,  or diarrhea. 12-10-2021 upon evaluation today patient's wounds actually appear to be doing awesome. I am extremely pleased with where we stand I think she is making excellent progress. I do not see any signs of active infection locally or systemically at this time. 12-17-2021 upon evaluation today patient's wounds are actually showing signs of good improvement. Fortunately I do not see any evidence of active infection locally nor systemically which is great news. Overall I do not believe that she is showing any signs of significant worsening which is good news. With that being said I do believe that she has a little bit of film on the surface of the wound I was actually able to clear this away with saline and gauze no sharp debridement was necessary. 12-24-2021 upon evaluation today patient appears to be doing well with regard to her wounds. I do not see any signs of active infection currently which is great news and overall I think that we are on the right track here. No fevers, chills, nausea, vomiting, or diarrhea. 12-31-2021 upon evaluation today patient appears to be doing well with regard to her wounds. Both are showing signs of improvement the wound [name], [name] ([id number]) on the right leg is going require some sharp debridement on the left seems to be doing quite well. 01-07-2022 upon evaluation today patient appears to be doing well currently in regard to her wounds. She is actually making good progress bilaterally. Both of them require little bit of sharp debridement but not too much which is great news. Upon inspection patient's wound bed actually showed signs of good granulation and epithelization on both legs. She still is having significant issues with the right leg compared to the left although I do feel like both are actually showing signs of pretty good improvement. 01-21-2022 upon evaluation today patient's wound is actually showing signs of excellent improvement I am very pleased  with where we stand currently. I do not see any signs of active infection locally or systemically which is great news. No fevers, chills, nausea, vomiting, or diarrhea. 01-28-2022 upon evaluation today patient appears to be doing well with regard to her wound on the left leg this is pretty much just about healed which is great news. Unfortunately in regard to the wound on her right leg this is not doing nearly as well in fact I think we probably need to see about a culture today that was discussed with her. 02-04-2022 upon evaluation today patient appears to be doing well with regard to her left leg which in fact might actually be completely healed I am not 100% sure. Nonetheless this is shown signs of excellent improvement which is great news. With that being said in regards to the right leg there is some slough and film buildup on the surface of the wound although she is feeling much better than last week this does appear to be doing much better than it was last week. Fortunately there does not appear to be any signs of active infection locally or systemically at this time. 02-11-2022 upon evaluation today patient's wounds appear to be doing decently well. In fact the left leg is healed although there is brand-new skin were definitely given still need to wrap her for the time being. On the right leg this  is showing signs of some epithelial growth in the middle of the wound it still measures the same because there is still a larger area with speckled openings but again as far as the entire area being open this is significantly improved which is great news. I am very pleased. 6/13; left leg remains healed and she has a juxta light to apply to it today. There is some concern about whether she is going to be able to do this at home. She lives with a daughter although she has her own disability. On the right the wound looks smaller to me nice epithelialization good edema control. 02-25-2022 upon evaluation  today patient appears to be doing well with regard to her legs the left leg is still healed the right leg is doing much better. I think we are on the right track here. With that being said unfortunately she continues to have significant issues with some other problems and she actually tells me on Saturday she was having lunch and went inside to sit down when she tells me she had all of a sudden a sharp sensation that went from her hand through her arm into her leg and her neck and she tells me that she could not see anything for the next hour it was completely black. Slowly after this her vision started to return and has been normal since. It sounds to me as if she may have had a TIA or mini stroke at this point. Nonetheless I think that she needs to have this checked out ASAP. Her primary care provider is Dr. Clide Deutscher at [facility]. We will get a try to get in touch with him. 03-10-2022 upon evaluation today patient's wound actually showed signs of good improvement. She seems to be making excellent progress here. I do not see any evidence of active infection locally or systemically which is great news. No fevers, chills, nausea, vomiting, or diarrhea. She was in the hospital since have seen her last she ended up finding out that she was having syncopal episodes. That is what was going on with the blacking out and losing vision. Subsequently they adjusted some of her medication she seems to be doing better. 03-18-2022 upon evaluation today patient's wound is actually showing signs of good granulation and epithelization at this point. Fortunately I do not see any signs of active infection locally or systemically which is great news. 03-25-2022 any evidence of active infection locally or systemically which is great news. Any evidence of active infection which is great news and overall the wound is looking better. Upon evaluation today patient appears to be doing well with regard to her leg ulcer. She has  been tolerating the dressing changes without complication. Fortunately I do not see 7/25; the patient's wound is on the right lateral lower extremity using silver alginate 3 layer compression. She has chronic venous insufficiency and lymphedema. We are using a juxta lite on the left leg which does not have any open wounds but she is leaving this on all week it seems that there just is not a way to get this changed at home 04-08-2022 upon evaluation today patient appears to be doing well currently in regard to her wound. She has been tolerating the dressing changes without complication. Fortunately I do not see any evidence of active infection locally or systemically which is great news. No fever or chills noted her left leg has reopened at this point. 04-15-2022 upon evaluation patient's wounds actually are showing signs of  improvement which is great news. Little by little I do believe her making progress here. In regard to the right leg this is looking better though the measurement does not speak to it there is a lot of new skin growth. On the left leg this is significantly smaller compared to last week most of the blistered area has completely closed. 04-22-2022 upon evaluation today patient appears to be doing excellent in regard to her wounds both are showing signs of improvement. Her knees and above are still extremely swollen the legs were rewrapped them are down significantly. With that being said I do not think that we will get a be able to keep them that way once were not rapid noted that she really has no way to be able to put juxta lites on and off and nobody to help her with that. That is the biggest concern that we have here at this point. Fortunately I do not think that there is any evidence of infection which is great news. 04-29-2022 upon evaluation today patient appears to be doing well with regard to her wounds. She has been tolerating the dressing changes without complication.  Fortunately there does not appear to be any evidence of infection locally or systemically which is great news and overall I am extremely pleased in that regard. Left leg is healed the right leg is very close we discussed today the possibility of her granddaughter helping with putting on and off compression wraps for her. 05-06-2022 upon evaluation today patient appears to be doing well currently in regard to her left leg which is showing signs of being completely healed. With regard to her right leg she still has an open area though this is doing much better there is a lot less drainage than what we previously noted. Fortunately I see no evidence of active infection locally or systemically at this time which is great news. 05-20-2022 upon evaluation today patient appears to be doing excellent in regard to her leg. The left leg is still healed the right leg is much better. Fortunately I see no signs of infection locally or systemically at this time which is great news. No fevers, chills, nausea, vomiting, or diarrhea. 05-27-2022 upon evaluation today patient appears to be doing excellent in regard to her wound this is actually measuring smaller and looking [name], [name] ([id number]) much better. Fortunately I see no signs of active infection locally or systemically at this time. Objective Constitutional Well-nourished and well-hydrated in no acute distress. Vitals Time Taken: 2:05 PM, Height: 69 in, Weight: 244 lbs, BMI: 36, Temperature: 98 F, Pulse: 71 bpm, Respiratory Rate: 18 breaths/min, Blood Pressure: 109/71 mmHg. Respiratory normal breathing without difficulty. Psychiatric this patient is able to make decisions and demonstrates good insight into disease process. Alert and Oriented x 3. pleasant and cooperative. General Notes: Upon inspection patient's wound bed showed evidence of good granulation and epithelization at this point. I do not see any evidence of anything worsening which is  great news and overall I am extremely pleased with where we stand today. Integumentary (Hair, Skin) Wound #4 status is Open. Original cause of wound was Gradually Appeared. The date acquired was: 10/27/2021. The wound has been in treatment 30 weeks. The wound is located on the Right,Lateral Lower Leg. The wound measures 5cm length x 3cm width x 0.1cm depth; 11.781cm^2 area and 1.178cm^3 volume. There is Fat Layer (Subcutaneous Tissue) exposed. There is no tunneling or undermining noted. There is a medium amount of serosanguineous drainage noted.  There is medium (34-66%) pink granulation within the wound bed. There is a medium (34-66%) amount of necrotic tissue within the wound bed including Adherent Slough. 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 There was a Three Layer Compression Therapy Procedure by Carlene Coria, RN. Post procedure Diagnosis Wound #4: Same as Pre-Procedure There was a Three Layer Compression Therapy Procedure by Carlene Coria, RN. Post procedure Diagnosis Wound #: Same as Pre-Procedure Plan Follow-up Appointments: Return Appointment in 1 week. Nurse Visit as needed [name], [name] ([id number]) Bathing/ Shower/ Hygiene: May shower with wound dressing protected with water repellent cover or cast protector. No tub bath. Anesthetic (Use 'Patient Medications' Section for Anesthetic Order Entry): Lidocaine applied to wound bed Edema Control - Lymphedema / Segmental Compressive Device / Other: Optional: One layer of unna paste to top of compression wrap (to act as an anchor). - she needs this 3 Layer Compression System for Lymphedema. - left Elevate,  Exercise Daily and Avoid Standing for Long Periods of Time. Elevate legs to the level of the heart and pump ankles as often as possible Elevate leg(s) parallel to the floor when sitting. Compression Pump: Use compression pump on left lower extremity for 60 minutes, twice daily. - You may pump over wraps Compression Pump: Use compression pump on right lower extremity for 60 minutes, twice daily. - You may pump over wraps Additional Orders / Instructions: Follow Nutritious Diet and Increase Protein Intake WOUND #4: - Lower Leg Wound Laterality: Right, Lateral Cleanser: Soap and Water 1 x Per Week/30 Days Discharge Instructions: Gently cleanse wound with antibacterial soap, rinse and pat dry prior to dressing wounds Cleanser: Wound Cleanser 1 x Per Week/30 Days Discharge Instructions: Wash your hands with soap and water. Remove old dressing, discard into plastic bag and place into trash. Cleanse the wound with Wound Cleanser prior to applying a clean dressing using gauze sponges, not tissues or cotton balls. Do not scrub or use excessive force. Pat dry using gauze sponges, not tissue or cotton balls. Primary Dressing: Silvercel Small 2x2 (in/in) 1 x Per Week/30 Days Discharge Instructions: Apply Silvercel Small 2x2 (in/in) as instructed Secondary Dressing: ABD Pad 5x9 (in/in) 1 x Per Week/30 Days Discharge Instructions: Cover with ABD pad Compression Wrap: 3-LAYER WRAP - Profore Lite LF 3 Multilayer Compression Bandaging System 1 x Per Week/30 Days Discharge Instructions: Apply 3 multi-layer wrap as prescribed. Compression Stockings: Circaid Juxta Lite Compression Wrap Compression Amount: 20-30 mmHg (right) Discharge Instructions: Apply Circaid Juxta Lite Compression Wrap as directed 1. I am going to suggest that we go ahead and continue with the 3 layer compression wraps bilaterally that seems to be doing a good job for her. 2. I am also can recommend that we have the patient continue to  monitor for any signs of worsening or infection. Obviously if anything changes she should contact the office and let me know right now I do feel like though she is doing much better. We will see patient back for reevaluation in 1 week here in the clinic. If anything worsens or changes patient will contact our office for additional recommendations. Electronic Signature(s) Signed: 05/27/2022 2:39:36 PM By: Worthy Keeler PA-C Entered By: Worthy Keeler on 05/27/2022 14:39:36 [name], [name] (  [id number]) -------------------------------------------------------------------------------- SuperBill Details Patient Name: [name], [name] Date of Service: 05/27/2022 Medical Record Number: [id number] Patient Account Number: [id number] Date of Birth/Sex: [birth year] (76 y.o. F) Treating RN: Carlene Coria Primary Care Provider: [name] Other Clinician: Referring Provider: [name] Treating Provider/Extender: Skipper Cliche in Treatment: 70 Diagnosis Coding ICD-10 Codes Code Description I89.0 Lymphedema, not elsewhere classified I87.332 Chronic venous hypertension (idiopathic) with ulcer and inflammation of left lower extremity L97.812 Non-pressure chronic ulcer of other part of right lower leg with fat layer exposed Facility Procedures CPT4: Description Modifier Quantity Code [id number] BILATERAL: Application of multi-layer venous compression system; leg (below knee), including 1 ankle and foot. Physician Procedures CPT4 Code Description: 1093235 [id number] - WC PHYS LEVEL 3 - EST PT Modifier: Quantity: 1 CPT4 Code Description: ICD-10 Diagnosis Description I89.0 Lymphedema, not elsewhere classified I87.332 Chronic venous hypertension (idiopathic) with ulcer and inflammation of L97.812 Non-pressure chronic ulcer of other part of right lower leg with fat  lay Modifier: left lower extremity er exposed Quantity: Electronic Signature(s) Signed: 05/27/2022 2:39:52 PM By: Worthy Keeler  PA-C Entered By: Worthy Keeler on 05/27/2022 14:39:52

## 2022-05-28 ENCOUNTER — Ambulatory Visit
Admission: RE | Admit: 2022-05-28 | Discharge: 2022-05-28 | Disposition: A | Discharge status: Home / Self Care | Payer: Medicare Other | Source: Ambulatory Visit | Attending: Family Medicine | Admitting: Family Medicine

## 2022-05-28 DIAGNOSIS — R1084 Generalized abdominal pain: Secondary | ICD-10-CM | POA: Insufficient documentation

## 2022-05-28 MED ORDER — IOHEXOL 300 MG/ML  SOLN
80.0000 mL | Freq: Once | INTRAMUSCULAR | Status: AC | PRN
  Administered 2022-05-28: 80 mL via INTRAVENOUS

## 2022-05-29 ENCOUNTER — Telehealth: Payer: Self-pay

## 2022-05-29 NOTE — Telephone Encounter (Signed)
Called patient informed her per Lauren bone density scan was normal and to take calcium 1200 mg and vitamin d 1000 iu daily along with exercise as tolerated. Patient agrees and verbalizes understanding

## 2022-05-30 NOTE — Progress Notes (Signed)
TAYANNA, TALFORD ([id number]) Visit Report for 05/27/2022 Arrival Information Details Patient Name: [name], [name] Date of Service: 05/27/2022 2:00 PM Medical Record Number: [id number] Patient Account Number: [id number] Date of Birth/Sex: 1945/11/17 (76 y.o. F) Treating RN: Carlene Coria Primary Care Dehlia Kilner: [name] Other Clinician: Referring Lachae Hohler: [name] Treating Toni Demo/Extender: Skipper Cliche in Treatment: 72 Visit Information History Since Last Visit All ordered tests and consults were completed: No Patient Arrived: Wheel Chair Added or deleted any medications: No Arrival Time: 13:57 Any new allergies or adverse reactions: No Accompanied By: self Had a fall or experienced change in No Transfer Assistance: None activities of daily living that may affect Patient Identification Verified: Yes risk of falls: Secondary Verification Process Completed: Yes Signs or symptoms of abuse/neglect since last visito No Patient Requires Transmission-Based Precautions: No Hospitalized since last visit: No Patient Has Alerts: Yes Implantable device outside of the clinic excluding No Patient Alerts: NOT DIABETIC cellular tissue based products placed in the center since last visit: Has Dressing in Place as Prescribed: Yes Pain Present Now: No Electronic Signature(s) Signed: 05/30/2022 9:44:45 AM By: Carlene Coria RN Entered By: Carlene Coria on 05/27/2022 14:05:09 [name] ([id number]) -------------------------------------------------------------------------------- Clinic Level of Care Assessment Details Patient Name: [name] Date of Service: 05/27/2022 2:00 PM Medical Record Number: [id number] Patient Account Number: [id number] Date of Birth/Sex: Aug 28, 1946 (76 y.o. F) Treating RN: Carlene Coria Primary Care Mystie Ormand: [name] Other Clinician: Referring Kamani Magnussen: [name] Treating Soren Pigman/Extender: Skipper Cliche in Treatment: 68 Clinic  Level of Care Assessment Items TOOL 1 Quantity Score '[]'$  - Use when EandM and Procedure is performed on INITIAL visit 0 ASSESSMENTS - Nursing Assessment / Reassessment '[]'$  - General Physical Exam (combine w/ comprehensive assessment (listed just below) when performed on new 0 pt. evals) '[]'$  - 0 Comprehensive Assessment (HX, ROS, Risk Assessments, Wounds Hx, etc.) ASSESSMENTS - Wound and Skin Assessment / Reassessment '[]'$  - Dermatologic / Skin Assessment (not related to wound area) 0 ASSESSMENTS - Ostomy and/or Continence Assessment and Care '[]'$  - Incontinence Assessment and Management 0 '[]'$  - 0 Ostomy Care Assessment and Management (repouching, etc.) PROCESS - Coordination of Care '[]'$  - Simple Patient / Family Education for ongoing care 0 '[]'$  - 0 Complex (extensive) Patient / Family Education for ongoing care '[]'$  - 0 Staff obtains Programmer, systems, Records, Test Results / Process Orders '[]'$  - 0 Staff telephones HHA, Nursing Homes / Clarify orders / etc '[]'$  - 0 Routine Transfer to another Facility (non-emergent condition) '[]'$  - 0 Routine Hospital Admission (non-emergent condition) '[]'$  - 0 New Admissions / Biomedical engineer / Ordering NPWT, Apligraf, etc. '[]'$  - 0 Emergency Hospital Admission (emergent condition) PROCESS - Special Needs '[]'$  - Pediatric / Minor Patient Management 0 '[]'$  - 0 Isolation Patient Management '[]'$  - 0 Hearing / Language / Visual special needs '[]'$  - 0 Assessment of Community assistance (transportation, D/C planning, etc.) '[]'$  - 0 Additional assistance / Altered mentation '[]'$  - 0 Support Surface(s) Assessment (bed, cushion, seat, etc.) INTERVENTIONS - Miscellaneous '[]'$  - External ear exam 0 '[]'$  - 0 Patient Transfer (multiple staff / Civil Service fast streamer / Similar devices) '[]'$  - 0 Simple Staple / Suture removal (25 or less) '[]'$  - 0 Complex Staple / Suture removal (26 or more) '[]'$  - 0 Hypo/Hyperglycemic Management (do not check if billed separately) '[]'$  - 0 Ankle / Brachial Index  (ABI) - do not check if billed separately Has the patient been seen at the hospital within the last three years: Yes Total Score: 0  Level Of Care: ____ [name] ([id number]) Electronic Signature(s) Signed: 05/30/2022 9:44:45 AM By: Carlene Coria RN Entered By: Carlene Coria on 05/27/2022 14:24:43 [name] ([id number]) -------------------------------------------------------------------------------- Compression Therapy Details Patient Name: [name] Date of Service: 05/27/2022 2:00 PM Medical Record Number: [id number] Patient Account Number: [id number] Date of Birth/Sex: November 16, 1945 (76 y.o. F) Treating RN: Carlene Coria Primary Care Curtina Grills: [name] Other Clinician: Referring Hattye Siegfried: [name] Treating Saiquan Hands/Extender: Skipper Cliche in Treatment: 70 Compression Therapy Performed for Wound Assessment: Wound #4 Right,Lateral Lower Leg Performed By: Clinician Carlene Coria, RN Compression Type: Three Layer Post Procedure Diagnosis Same as Pre-procedure Electronic Signature(s) Signed: 05/30/2022 9:44:45 AM By: Carlene Coria RN Entered By: Carlene Coria on 05/27/2022 14:24:05 [name] ([id number]) -------------------------------------------------------------------------------- Compression Therapy Details Patient Name: [name] Date of Service: 05/27/2022 2:00 PM Medical Record Number: [id number] Patient Account Number: [id number] Date of Birth/Sex: 18-Oct-1945 (76 y.o. F) Treating RN: Carlene Coria Primary Care Jaislyn Blinn: [name] Other Clinician: Referring Winry Egnew: [name] Treating Ardell Makarewicz/Extender: Skipper Cliche in Treatment: 70 Compression Therapy Performed for Wound Assessment: NonWound Condition Lymphedema - Left Leg Performed By: Clinician Carlene Coria, RN Compression Type: Three Layer Post Procedure Diagnosis Same as Pre-procedure Electronic Signature(s) Signed: 05/30/2022 9:44:45 AM By: Carlene Coria RN Entered By:  Carlene Coria on 05/27/2022 14:24:26 [name] ([id number]) -------------------------------------------------------------------------------- Encounter Discharge Information Details Patient Name: [name] Date of Service: 05/27/2022 2:00 PM Medical Record Number: [id number] Patient Account Number: [id number] Date of Birth/Sex: 1946/07/02 (76 y.o. F) Treating RN: Carlene Coria Primary Care Babara Buffalo: [name] Other Clinician: Referring [name] Herberger: [name] Treating Carmen Tolliver/Extender: Skipper Cliche in Treatment: 76 Encounter Discharge Information Items Discharge Condition: Stable Ambulatory Status: Wheelchair Discharge Destination: Home Transportation: Private Auto Accompanied By: self Schedule Follow-up Appointment: Yes Clinical Summary of Care: Electronic Signature(s) Signed: 05/30/2022 9:44:45 AM By: Carlene Coria RN Entered By: Carlene Coria on 05/27/2022 14:26:11 [name] ([id number]) -------------------------------------------------------------------------------- Lower Extremity Assessment Details Patient Name: [name] Date of Service: 05/27/2022 2:00 PM Medical Record Number: [id number] Patient Account Number: [id number] Date of Birth/Sex: March 22, 1946 (76 y.o. F) Treating RN: Carlene Coria Primary Care Mida Cory: [name] Other Clinician: Referring Mckinze Poirier: [name] Treating Deatrice Spanbauer/Extender: Skipper Cliche in Treatment: 50 Vascular Assessment Pulses: Dorsalis Pedis Palpable: [Right:Yes] Electronic Signature(s) Signed: 05/30/2022 9:44:45 AM By: Carlene Coria RN Entered By: Carlene Coria on 05/27/2022 14:17:45 [name] ([id number]) -------------------------------------------------------------------------------- Multi Wound Chart Details Patient Name: [name] Date of Service: 05/27/2022 2:00 PM Medical Record Number: [id number] Patient Account Number: [id number] Date of Birth/Sex: 16-Nov-1945 (76 y.o. F) Treating  RN: Carlene Coria Primary Care Safaa Stingley: [name] Other Clinician: Referring Athziri Freundlich: [name] Treating Mckayla Mulcahey/Extender: Skipper Cliche in Treatment: 19 Vital Signs Height(in): 69 Pulse(bpm): 31 Weight(lbs): 244 Blood Pressure(mmHg): 109/71 Body Mass Index(BMI): 36 Temperature(F): 98 Respiratory Rate(breaths/min): 18 Photos: [N/A:N/A] Wound Location: Right, Lateral Lower Leg N/A N/A Wounding Event: Gradually Appeared N/A N/A Primary Etiology: Venous Leg Ulcer N/A N/A Comorbid History: Cataracts, Lymphedema, Sleep N/A N/A Apnea, Congestive Heart Failure, Hypertension, Osteoarthritis, Neuropathy, Received Chemotherapy, Received Radiation Date Acquired: 10/27/2021 N/A N/A Weeks of Treatment: 30 N/A N/A Wound Status: Open N/A N/A Wound Recurrence: No N/A N/A Measurements L x W x D (cm) 5x3x0.1 N/A N/A Area (cm) : 11.781 N/A N/A Volume (cm) : 1.178 N/A N/A % Reduction in Area: -900.10% N/A N/A % Reduction in Volume: -898.30% N/A N/A Classification: Full Thickness Without Exposed N/A N/A Support Structures Exudate Amount: Medium N/A N/A Exudate Type: Serosanguineous N/A N/A Exudate Color:  red, brown N/A N/A Granulation Amount: Medium (34-66%) N/A N/A Granulation Quality: Pink N/A N/A Necrotic Amount: Medium (34-66%) N/A N/A Exposed Structures: Fat Layer (Subcutaneous Tissue): N/A N/A Yes Fascia: No Tendon: No Muscle: No Joint: No Bone: No Epithelialization: None N/A N/A Treatment Notes Electronic Signature(s) Signed: 05/30/2022 9:44:45 AM By: Carlene Coria RN Entered By: Carlene Coria on 05/27/2022 14:18:01 [name] ([id number]) KYNLEIGH, ARTZ ([id number]) -------------------------------------------------------------------------------- Multi-Disciplinary Care Plan Details Patient Name: [name] Date of Service: 05/27/2022 2:00 PM Medical Record Number: [id number] Patient Account Number: [id number] Date of Birth/Sex: 1946/05/12 (76 y.o.  F) Treating RN: Carlene Coria Primary Care Janeya Deyo: [name] Other Clinician: Referring Kenzli Barritt: [name] Treating Mckenzie Bove/Extender: Skipper Cliche in Treatment: 81 Active Inactive Electronic Signature(s) Signed: 05/30/2022 9:44:45 AM By: Carlene Coria RN Entered By: Carlene Coria on 05/27/2022 14:17:51 [name] ([id number]) -------------------------------------------------------------------------------- Pain Assessment Details Patient Name: [name] Date of Service: 05/27/2022 2:00 PM Medical Record Number: [id number] Patient Account Number: [id number] Date of Birth/Sex: Aug 18, 1946 (76 y.o. F) Treating RN: Carlene Coria Primary Care Arta Stump: [name] Other Clinician: Referring Janyth Riera: [name] Treating Aarilyn Dye/Extender: Skipper Cliche in Treatment: 18 Active Problems Location of Pain Severity and Description of Pain Patient Has Paino No Site Locations Pain Management and Medication Current Pain Management: Electronic Signature(s) Signed: 05/30/2022 9:44:45 AM By: Carlene Coria RN Entered By: Carlene Coria on 05/27/2022 14:05:36 [name] ([id number]) -------------------------------------------------------------------------------- Patient/Caregiver Education Details Patient Name: [name] Date of Service: 05/27/2022 2:00 PM Medical Record Number: [id number] Patient Account Number: [id number] Date of Birth/Gender: [birth year] (76 y.o. F) Treating RN: Carlene Coria Primary Care Physician: [name] Other Clinician: Referring Physician: [name] Treating Physician/Extender: Skipper Cliche in Treatment: 21 Education Assessment Education Provided To: Patient Education Topics Provided Wound/Skin Impairment: Methods: Explain/Verbal Responses: State content correctly Electronic Signature(s) Signed: 05/30/2022 9:44:45 AM By: Carlene Coria RN Entered By: Carlene Coria on 05/27/2022 14:25:36 [name]  ([id number]) -------------------------------------------------------------------------------- Wound Assessment Details Patient Name: [name] Date of Service: 05/27/2022 2:00 PM Medical Record Number: [id number] Patient Account Number: [id number] Date of Birth/Sex: 1946/08/17 (76 y.o. F) Treating RN: Carlene Coria Primary Care Khadijatou Borak: [name] Other Clinician: Referring Durwin Davisson: [name] Treating Rogan Ecklund/Extender: Skipper Cliche in Treatment: 70 Wound Status Wound Number: 4 Primary Venous Leg Ulcer Etiology: Wound Location: Right, Lateral Lower Leg Wound Open Wounding Event: Gradually Appeared Status: Date Acquired: 10/27/2021 Comorbid Cataracts, Lymphedema, Sleep Apnea, Congestive Heart Weeks Of Treatment: 30 History: Failure, Hypertension, Osteoarthritis, Neuropathy, Clustered Wound: No Received Chemotherapy, Received Radiation Photos Wound Measurements Length: (cm) 5 Width: (cm) 3 Depth: (cm) 0.1 Area: (cm) 11.781 Volume: (cm) 1.178 % Reduction in Area: -900.1% % Reduction in Volume: -898.3% Epithelialization: None Tunneling: No Undermining: No Wound Description Classification: Full Thickness Without Exposed Support Structures Exudate Amount: Medium Exudate Type: Serosanguineous Exudate Color: red, brown Foul Odor After Cleansing: No Slough/Fibrino Yes Wound Bed Granulation Amount: Medium (34-66%) Exposed Structure Granulation Quality: Pink Fascia Exposed: No Necrotic Amount: Medium (34-66%) Fat Layer (Subcutaneous Tissue) Exposed: Yes Necrotic Quality: Adherent Slough Tendon Exposed: No Muscle Exposed: No Joint Exposed: No Bone Exposed: No Treatment Notes Wound #4 (Lower Leg) Wound Laterality: Right, Lateral Cleanser Soap and Water Discharge Instruction: Gently cleanse wound with antibacterial soap, rinse and pat dry prior to dressing wounds Wound Cleanser ANH, BIGOS ([id number]) Discharge Instruction: Wash your hands with  soap and water. Remove old dressing, discard into plastic bag and place into trash. Cleanse the wound with Wound Cleanser prior to applying a clean dressing using  gauze sponges, not tissues or cotton balls. Do not scrub or use excessive force. Pat dry using gauze sponges, not tissue or cotton balls. Peri-Wound Care Topical Primary Dressing Silvercel Small 2x2 (in/in) Discharge Instruction: Apply Silvercel Small 2x2 (in/in) as instructed Secondary Dressing ABD Pad 5x9 (in/in) Discharge Instruction: Cover with ABD pad Secured With Compression Wrap 3-LAYER WRAP - Profore Lite LF 3 Multilayer Compression Bandaging System Discharge Instruction: Apply 3 multi-layer wrap as prescribed. Compression Stockings Circaid Juxta Lite Compression Wrap Quantity: 1 Right Leg Compression Amount: 20-30 mmHg Discharge Instruction: Apply Circaid Juxta Lite Compression Wrap as directed Add-Ons Electronic Signature(s) Signed: 05/30/2022 9:44:45 AM By: Carlene Coria RN Entered By: Carlene Coria on 05/27/2022 14:17:25 [name] ([id number]) -------------------------------------------------------------------------------- [facility] Details Patient Name: [name] Date of Service: 05/27/2022 2:00 PM Medical Record Number: [id number] Patient Account Number: [id number] Date of Birth/Sex: 04-12-1946 (76 y.o. F) Treating RN: Carlene Coria Primary Care Shaylan Tutton: [name] Other Clinician: Referring Nicko Daher: [name] Treating Rhyan Wolters/Extender: Skipper Cliche in Treatment: 48 Vital Signs Time Taken: 14:05 Temperature (F): 98 Height (in): 69 Pulse (bpm): 71 Weight (lbs): 244 Respiratory Rate (breaths/min): 18 Body Mass Index (BMI): 36 Blood Pressure (mmHg): 109/71 Reference Range: 80 - 120 mg / dl Electronic Signature(s) Signed: 05/30/2022 9:44:45 AM By: Carlene Coria RN Entered By: Carlene Coria on 05/27/2022 14:05:28

## 2022-06-03 ENCOUNTER — Encounter: Payer: Medicare Other | Admitting: Physician Assistant

## 2022-06-03 DIAGNOSIS — L97812 Non-pressure chronic ulcer of other part of right lower leg with fat layer exposed: Secondary | ICD-10-CM | POA: Diagnosis not present

## 2022-06-03 NOTE — Progress Notes (Signed)
JORDYNN, MARCELLA ([id number]) Visit Report for 06/03/2022 Arrival Information Details Patient Name: [name], [name] Date of Service: 06/03/2022 2:00 PM Medical Record Number: [id number] Patient Account Number: [id number] Date of Birth/Sex: [birth year] (76 y.o. F) Treating RN: Carlene Coria Primary Care Estrella Alcaraz: [name] Other Clinician: Referring Maygan Koeller: [name] Treating Ameir Faria/Extender: Skipper Cliche in Treatment: 58 Visit Information History Since Last Visit All ordered tests and consults were completed: No Patient Arrived: Wheel Chair Added or deleted any medications: No Arrival Time: 14:15 Any new allergies or adverse reactions: No Accompanied By: self Had a fall or experienced change in No Transfer Assistance: None activities of daily living that may affect Patient Identification Verified: Yes risk of falls: Secondary Verification Process Completed: Yes Signs or symptoms of abuse/neglect since last visito No Patient Requires Transmission-Based Precautions: No Hospitalized since last visit: No Patient Has Alerts: Yes Implantable device outside of the clinic excluding No Patient Alerts: NOT DIABETIC cellular tissue based products placed in the center since last visit: Has Dressing in Place as Prescribed: Yes Has Compression in Place as Prescribed: Yes Pain Present Now: No Electronic Signature(s) Unsigned Entered ByCarlene Coria on 06/03/2022 14:20:52 Signature(s): Date(s): [name] ([id number]) -------------------------------------------------------------------------------- Clinic Level of Care Assessment Details Patient Name: [name], [name] Date of Service: 06/03/2022 2:00 PM Medical Record Number: [id number] Patient Account Number: [id number] Date of Birth/Sex: May 13, 1946 (76 y.o. F) Treating RN: Carlene Coria Primary Care Jemima Petko: [name] Other Clinician: Referring Luisfelipe Engelstad: [name] Treating Ronesha Heenan/Extender: Skipper Cliche in Treatment: 76 Clinic Level of Care Assessment Items TOOL 1 Quantity Score '[]'$  - Use when EandM and Procedure is performed on INITIAL visit 0 ASSESSMENTS - Nursing Assessment / Reassessment '[]'$  - General Physical Exam (combine w/ comprehensive assessment (listed just below) when performed on new 0 pt. evals) '[]'$  - 0 Comprehensive Assessment (HX, ROS, Risk Assessments, Wounds Hx, etc.) ASSESSMENTS - Wound and Skin Assessment / Reassessment '[]'$  - Dermatologic / Skin Assessment (not related to wound area) 0 ASSESSMENTS - Ostomy and/or Continence Assessment and Care '[]'$  - Incontinence Assessment and Management 0 '[]'$  - 0 Ostomy Care Assessment and Management (repouching, etc.) PROCESS - Coordination of Care '[]'$  - Simple Patient / Family Education for ongoing care 0 '[]'$  - 0 Complex (extensive) Patient / Family Education for ongoing care '[]'$  - 0 Staff obtains Programmer, systems, Records, Test Results / Process Orders '[]'$  - 0 Staff telephones HHA, Nursing Homes / Clarify orders / etc '[]'$  - 0 Routine Transfer to another Facility (non-emergent condition) '[]'$  - 0 Routine Hospital Admission (non-emergent condition) '[]'$  - 0 New Admissions / Biomedical engineer / Ordering NPWT, Apligraf, etc. '[]'$  - 0 Emergency Hospital Admission (emergent condition) PROCESS - Special Needs '[]'$  - Pediatric / Minor Patient Management 0 '[]'$  - 0 Isolation Patient Management '[]'$  - 0 Hearing / Language / Visual special needs '[]'$  - 0 Assessment of Community assistance (transportation, D/C planning, etc.) '[]'$  - 0 Additional assistance / Altered mentation '[]'$  - 0 Support Surface(s) Assessment (bed, cushion, seat, etc.) INTERVENTIONS - Miscellaneous '[]'$  - External ear exam 0 '[]'$  - 0 Patient Transfer (multiple staff / Civil Service fast streamer / Similar devices) '[]'$  - 0 Simple Staple / Suture removal (25 or less) '[]'$  - 0 Complex Staple / Suture removal (26 or more) '[]'$  - 0 Hypo/Hyperglycemic Management (do not check if billed  separately) '[]'$  - 0 Ankle / Brachial Index (ABI) - do not check if billed separately Has the patient been seen at the hospital within the last three years: Yes Total  Score: 0 Level Of Care: ____ [name] ([id number]) Electronic Signature(s) Unsigned Entered By: Carlene Coria on 06/03/2022 14:54:20 Signature(s): Date(s): [name] ([id number]) -------------------------------------------------------------------------------- Compression Therapy Details Patient Name: [name], [name] Date of Service: 06/03/2022 2:00 PM Medical Record Number: [id number] Patient Account Number: [id number] Date of Birth/Sex: [birth year] (76 y.o. F) Treating RN: Carlene Coria Primary Care Rudransh Bellanca: [name] Other Clinician: Referring Keiden Deskin: [name] Treating Chevie Birkhead/Extender: Skipper Cliche in Treatment: 71 Compression Therapy Performed for Wound Assessment: Wound #4 Right,Lateral Lower Leg Performed By: Clinician Carlene Coria, RN Compression Type: Three Layer Post Procedure Diagnosis Same as Pre-procedure Electronic Signature(s) Signed: 06/03/2022 2:53:55 PM By: Carlene Coria RN Entered By: Carlene Coria on 06/03/2022 14:53:55 [name] ([id number]) -------------------------------------------------------------------------------- Compression Therapy Details Patient Name: [name] Date of Service: 06/03/2022 2:00 PM Medical Record Number: [id number] Patient Account Number: [id number] Date of Birth/Sex: [birth year] (76 y.o. F) Treating RN: Carlene Coria Primary Care Cleone Hulick: [name] Other Clinician: Referring Jensen Kilburg: [name] Treating Gerritt Galentine/Extender: Skipper Cliche in Treatment: 71 Compression Therapy Performed for Wound Assessment: NonWound Condition Lymphedema - Left Leg Performed By: Clinician Carlene Coria, RN Compression Type: Three Layer Post Procedure Diagnosis Same as Pre-procedure Electronic Signature(s) Signed: 06/03/2022 2:54:10 PM By:  Carlene Coria RN Entered By: Carlene Coria on 06/03/2022 14:54:10 [name] ([id number]) -------------------------------------------------------------------------------- Encounter Discharge Information Details Patient Name: [name] Date of Service: 06/03/2022 2:00 PM Medical Record Number: [id number] Patient Account Number: [id number] Date of Birth/Sex: 1946/01/18 (76 y.o. F) Treating RN: Carlene Coria Primary Care Caryle Helgeson: [name] Other Clinician: Referring Rin Gorton: [name] Treating Kameah Rawl/Extender: Skipper Cliche in Treatment: 12 Encounter Discharge Information Items Discharge Condition: Stable Ambulatory Status: Wheelchair Discharge Destination: Home Transportation: Private Auto Schedule Follow-up Appointment: Yes Clinical Summary of Care: Electronic Signature(s) Signed: 06/03/2022 2:55:23 PM By: Carlene Coria RN Entered By: Carlene Coria on 06/03/2022 14:55:23 [name] ([id number]) -------------------------------------------------------------------------------- Lower Extremity Assessment Details Patient Name: [name] Date of Service: 06/03/2022 2:00 PM Medical Record Number: [id number] Patient Account Number: [id number] Date of Birth/Sex: [birth year] (76 y.o. F) Treating RN: Carlene Coria Primary Care Anjel Perfetti: [name] Other Clinician: Referring Raif Chachere: [name] Treating Zeriyah Wain/Extender: Skipper Cliche in Treatment: 78 Electronic Signature(s) Unsigned Entered By: Carlene Coria on 06/03/2022 14:31:06 Signature(s): Date(s): [name] ([id number]) -------------------------------------------------------------------------------- Multi Wound Chart Details Patient Name: [name], [name] Date of Service: 06/03/2022 2:00 PM Medical Record Number: [id number] Patient Account Number: [id number] Date of Birth/Sex: 07/30/1946 (76 y.o. F) Treating RN: Carlene Coria Primary Care Delbert Darley: [name] Other  Clinician: Referring Damieon Armendariz: [name] Treating Christipher Rieger/Extender: Skipper Cliche in Treatment: 82 Vital Signs Height(in): 69 Pulse(bpm): 37 Weight(lbs): 244 Blood Pressure(mmHg): 168/73 Body Mass Index(BMI): 36 Temperature(F): 98.3 Respiratory Rate(breaths/min): 18 Photos: [N/A:N/A] Wound Location: Right, Lateral Lower Leg N/A N/A Wounding Event: Gradually Appeared N/A N/A Primary Etiology: Venous Leg Ulcer N/A N/A Comorbid History: Cataracts, Lymphedema, Sleep N/A N/A Apnea, Congestive Heart Failure, Hypertension, Osteoarthritis, Neuropathy, Received Chemotherapy, Received Radiation Date Acquired: 10/27/2021 N/A N/A Weeks of Treatment: 31 N/A N/A Wound Status: Open N/A N/A Wound Recurrence: No N/A N/A Measurements L x W x D (cm) 5x2.5x0.1 N/A N/A Area (cm) : 9.817 N/A N/A Volume (cm) : 0.982 N/A N/A % Reduction in Area: -733.40% N/A N/A % Reduction in Volume: -732.20% N/A N/A Classification: Full Thickness Without Exposed N/A N/A Support Structures Exudate Amount: Medium N/A N/A Exudate Type: Serosanguineous N/A N/A Exudate Color: red, brown N/A N/A Granulation Amount: Medium (34-66%) N/A N/A Granulation Quality: Pink N/A N/A Necrotic Amount: Medium (  34-66%) N/A N/A Exposed Structures: Fat Layer (Subcutaneous Tissue): N/A N/A Yes Fascia: No Tendon: No Muscle: No Joint: No Bone: No Epithelialization: None N/A N/A Treatment Notes Electronic Signature(s) Unsigned Entered ByCarlene Coria on 06/03/2022 14:31:53 [name] ([id number]) Signature(s): Date(s): JAQUILA, SANTELLI ([id number]) -------------------------------------------------------------------------------- Multi-Disciplinary Care Plan Details Patient Name: [name], [name] Date of Service: 06/03/2022 2:00 PM Medical Record Number: [id number] Patient Account Number: [id number] Date of Birth/Sex: [birth year] (76 y.o. F) Treating RN: Carlene Coria Primary Care Abrea Henle: [name]  Other Clinician: Referring Kolson Chovanec: [name] Treating Roston Grunewald/Extender: Skipper Cliche in Treatment: 26 Active Inactive Electronic Signature(s) Unsigned Entered By: Carlene Coria on 06/03/2022 14:31:13 Signature(s): Date(s): ABRAR, KOONE ([id number]) -------------------------------------------------------------------------------- Pain Assessment Details Patient Name: [name], [name] Date of Service: 06/03/2022 2:00 PM Medical Record Number: [id number] Patient Account Number: [id number] Date of Birth/Sex: [birth year] (76 y.o. F) Treating RN: Carlene Coria Primary Care Quirino Kakos: [name] Other Clinician: Referring Chassie Pennix: [name] Treating Karine Garn/Extender: Skipper Cliche in Treatment: 76 Active Problems Location of Pain Severity and Description of Pain Patient Has Paino No Site Locations Pain Management and Medication Current Pain Management: Electronic Signature(s) Unsigned Entered ByCarlene Coria on 06/03/2022 14:21:19 Signature(s): Date(s): [name] ([id number]) -------------------------------------------------------------------------------- Patient/Caregiver Education Details Patient Name: [name] Date of Service: 06/03/2022 2:00 PM Medical Record Number: [id number] Patient Account Number: [id number] Date of Birth/Gender: 1946/07/28 (76 y.o. F) Treating RN: Carlene Coria Primary Care Physician: [name] Other Clinician: Referring Physician: [name] Treating Physician/Extender: Skipper Cliche in Treatment: 60 Education Assessment Education Provided To: Patient Education Topics Provided Wound/Skin Impairment: Methods: Explain/Verbal Responses: State content correctly Electronic Signature(s) Unsigned Entered By: Carlene Coria on 06/03/2022 14:54:45 Signature(s): Date(s): [name] ([id number]) -------------------------------------------------------------------------------- Wound Assessment  Details Patient Name: [name], [name] Date of Service: 06/03/2022 2:00 PM Medical Record Number: [id number] Patient Account Number: [id number] Date of Birth/Sex: Sep 04, 1946 (76 y.o. F) Treating RN: Carlene Coria Primary Care Cherilynn Schomburg: [name] Other Clinician: Referring Treva Huyett: [name] Treating Chelsey Redondo/Extender: Skipper Cliche in Treatment: 71 Wound Status Wound Number: 4 Primary Venous Leg Ulcer Etiology: Wound Location: Right, Lateral Lower Leg Wound Open Wounding Event: Gradually Appeared Status: Date Acquired: 10/27/2021 Comorbid Cataracts, Lymphedema, Sleep Apnea, Congestive Heart Weeks Of Treatment: 31 History: Failure, Hypertension, Osteoarthritis, Neuropathy, Clustered Wound: No Received Chemotherapy, Received Radiation Photos Wound Measurements Length: (cm) 5 Width: (cm) 2.5 Depth: (cm) 0.1 Area: (cm) 9.817 Volume: (cm) 0.982 % Reduction in Area: -733.4% % Reduction in Volume: -732.2% Epithelialization: None Tunneling: No Undermining: No Wound Description Classification: Full Thickness Without Exposed Support Structures Exudate Amount: Medium Exudate Type: Serosanguineous Exudate Color: red, brown Foul Odor After Cleansing: No Slough/Fibrino Yes Wound Bed Granulation Amount: Medium (34-66%) Exposed Structure Granulation Quality: Pink Fascia Exposed: No Necrotic Amount: Medium (34-66%) Fat Layer (Subcutaneous Tissue) Exposed: Yes Necrotic Quality: Adherent Slough Tendon Exposed: No Muscle Exposed: No Joint Exposed: No Bone Exposed: No Treatment Notes Wound #4 (Lower Leg) Wound Laterality: Right, Lateral Cleanser Soap and Water Discharge Instruction: Gently cleanse wound with antibacterial soap, rinse and pat dry prior to dressing wounds Wound Cleanser ILIANY, LOSIER ([id number]) Discharge Instruction: Wash your hands with soap and water. Remove old dressing, discard into plastic bag and place into trash. Cleanse the wound with Wound  Cleanser prior to applying a clean dressing using gauze sponges, not tissues or cotton balls. Do not scrub or use excessive force. Pat dry using gauze sponges, not tissue or cotton balls. Peri-Wound Care Topical Primary Dressing Silvercel Small 2x2 (in/in) Discharge Instruction: Apply Silvercel Small  2x2 (in/in) as instructed Secondary Dressing ABD Pad 5x9 (in/in) Discharge Instruction: Cover with ABD pad Secured With Compression Wrap 3-LAYER WRAP - Profore Lite LF 3 Multilayer Compression Bandaging System Discharge Instruction: Apply 3 multi-layer wrap as prescribed. Compression Stockings Circaid Juxta Lite Compression Wrap Quantity: 1 Right Leg Compression Amount: 20-30 mmHg Discharge Instruction: Apply Circaid Juxta Lite Compression Wrap as directed Add-Ons Electronic Signature(s) Unsigned Entered By: Carlene Coria on 06/03/2022 14:30:54 Signature(s): Date(s): [name] ([id number]) -------------------------------------------------------------------------------- Vitals Details Patient Name: [name] Date of Service: 06/03/2022 2:00 PM Medical Record Number: [id number] Patient Account Number: [id number] Date of Birth/Sex: Feb 23, 1946 (76 y.o. F) Treating RN: Carlene Coria Primary Care Treasure Ingrum: [name] Other Clinician: Referring Gaylon Bentz: [name] Treating Delando Satter/Extender: Skipper Cliche in Treatment: 71 Vital Signs Time Taken: 14:20 Temperature (F): 98.3 Height (in): 69 Pulse (bpm): 62 Weight (lbs): 244 Respiratory Rate (breaths/min): 18 Body Mass Index (BMI): 36 Blood Pressure (mmHg): 168/73 Reference Range: 80 - 120 mg / dl Electronic Signature(s) Unsigned Entered ByCarlene Coria on 06/03/2022 14:21:08 Signature(s): Date(s):

## 2022-06-03 NOTE — Progress Notes (Signed)
  Subjective:  Patient ID: [name], female    DOB: 07/16/1946,  MRN: [id number]  Chief Complaint  Patient presents with   Nail Problem    Nail trim    76 y.o. female returns for the above complaint.  Patient presents with thickened elongated dystrophic toenails x10 mild pain on palpation.  Hurts with debridement hurts with pressure.  She would like for me to debride down she is not able to do it herself.  Objective:  There were no vitals filed for this visit. Podiatric Exam: Vascular: dorsalis pedis and posterior tibial pulses are palpable bilateral. Capillary return is immediate. Temperature gradient is WNL. Skin turgor WNL  Sensorium: Normal Semmes Weinstein monofilament test. Normal tactile sensation bilaterally. Nail Exam: Pt has thick disfigured discolored nails with subungual debris noted bilateral entire nail hallux through fifth toenails.  Pain on palpation to the nails. Ulcer Exam: There is no evidence of ulcer or pre-ulcerative changes or infection. Orthopedic Exam: Muscle tone and strength are WNL. No limitations in general ROM. No crepitus or effusions noted.  Skin: No Porokeratosis. No infection or ulcers    Assessment & Plan:   1. Pain due to onychomycosis of toenails of both feet     Patient was evaluated and treated and all questions answered.  Onychomycosis with pain  -Nails palliatively debrided as below. -Educated on self-care  Procedure: Nail Debridement Rationale: pain  Type of Debridement: manual, sharp debridement. Instrumentation: Nail nipper, rotary burr. Number of Nails: 10  Procedures and Treatment: Consent by patient was obtained for treatment procedures. The patient understood the discussion of treatment and procedures well. All questions were answered thoroughly reviewed. Debridement of mycotic and hypertrophic toenails, 1 through 5 bilateral and clearing of subungual debris. No ulceration, no infection noted.  Return Visit-Office  Procedure: Patient instructed to return to the office for a follow up visit 3 months for continued evaluation and treatment.  Boneta Lucks, DPM    No follow-ups on file.

## 2022-06-03 NOTE — Progress Notes (Addendum)
[name], [name] ([id number]) Visit Report for 06/03/2022 Chief Complaint Document Details Patient Name: [name], [name] Date of Service: 06/03/2022 2:00 PM Medical Record Number: [id number] Patient Account Number: [id number] Date of Birth/Sex: 05/21/1946 (76 y.o. F) Treating RN: Carlene Coria Primary Care Provider: [name] Other Clinician: Referring Provider: [name] Treating Provider/Extender: Skipper Cliche in Treatment: 42 Information Obtained from: Patient Chief Complaint Bilateral LE Ulcers Electronic Signature(s) Signed: 06/03/2022 2:16:54 PM By: Worthy Keeler PA-C Entered By: Worthy Keeler on 06/03/2022 14:16:54 [name] ([id number]) -------------------------------------------------------------------------------- HPI Details Patient Name: [name] Date of Service: 06/03/2022 2:00 PM Medical Record Number: [id number] Patient Account Number: [id number] Date of Birth/Sex: 09/29/1945 (76 y.o. F) Treating RN: Carlene Coria Primary Care Provider: [name] Other Clinician: Referring Provider: [name] Treating Provider/Extender: Skipper Cliche in Treatment: 49 History of Present Illness HPI Description: 03/15/2020 upon evaluation today patient presents today for initial evaluation here in our clinic concerning a wound that is on the left posterior lower extremity. Unfortunately this has been giving the patient some discomfort at this point which she notes has been affected her pretty much daily. The patient does have a history of venous insufficiency, hypertension, congestive heart failure, and a history of breast cancer. Fortunately there does not appear to be evidence of active infection at this time which is great news. No fevers, chills, nausea, vomiting, or diarrhea. Wound is not extremely large but does have some slough covering the surface of the wound. This is going require sharp debridement today. 03/15/2020 upon evaluation today patient  appears to be doing fairly well in regard to her wound. She does have some slough noted on the surface of the wound currently but I do believe the Iodoflex has been beneficial over the past week. There is no signs of active infection at this time which is great news and overall very pleased with the progress. No fevers, chills, nausea, vomiting, or diarrhea. 04/02/2020 upon evaluation today patient appears to be doing a little better in regard to her wound size wise this is not tremendously smaller she does have some slough buildup on the surface of the wound. With that being said this is going require some sharp debridement today to clear away some of the necrotic debris. Fortunately there is no evidence of active infection at this time. No fevers, chills, nausea, vomiting, or diarrhea. 04/10/2020 on evaluation today patient appears to be doing well with regard to her ulcer which is measuring somewhat smaller today. She actually has more granulation tissue noted which is also good news is no need for sharp debridement today. Fortunately there is no evidence of infection either which is also excellent. No fevers, chills, nausea, vomiting, or diarrhea. 04/26/2020 on evaluation today patient's wound actually is showing signs of good improvement which is great news. There does not appear to be any evidence of active infection. I do believe she is tolerating the collagen at this point. 05/03/2020 upon evaluation today patient's wound actually showed signs of good granulation at this time there does not appear to be any evidence of active infection which is great news and overall very pleased with where things stand. With that being said I do believe that the patient is can require some sharp debridement today but fortunately nothing too significant. 05/11/20 on evaluation today patient appears to be doing well in regard to her leg ulcer. She's been tolerating the dressing changes without complication.  Fortunately there is no signs of active infection at this time.  Overall I'm very pleased with where things stand. 05/18/2020 upon evaluation today patient's wound is showing signs of improvement is very dry and I think the collagen for the most part just dried to the wound bed. I am going to clean this away with sharp debridement in order to get this under control in my opinion. 05/25/2020 upon evaluation today patient actually appears to be doing well in regard to her leg ulcer. In fact upon inspection it appears she could potentially be completely healed but that is not guaranteed based on what we are seeing. There does not appear to be any signs of active infection which is great news. 05/31/2020 on evaluation today patient appears to be doing well with regard to her wounds. She in fact appears to be almost completely healed on the left leg there is just a very small opening remaining point 1 06/08/2020 upon evaluation today patient appears to be doing excellent in regard to her leg ulcer on the left in fact it appears to be that she is completely healed as of today. I see no signs of active infection at this time which is great news. No fevers, chills, nausea, vomiting, or diarrhea. READMISSION 01/16/2021 Patient that we have had in this clinic with a wound on the left posterior calf in the summer 2021 extending into October. She was felt to have chronic venous insufficiency. Per the patient's description she was discharged with what sounds like external compression stockings although she could not afford the "$75 per leg". She therefore did not get anything. Apparently her wound that she has currently is been in existence since January. She has been followed at vein and vascular with Unna boots and I think calcium alginate. She had ABIs done on 06/25/2020 that showed an noncompressible ABI on the right leg and the left leg but triphasic waveforms with good great toe pressures and a TBI of 0.90 on the  right and 0.91 on the left Surprisingly the patient also had venous reflux studies that were really unremarkable. This included no evidence of DVTs or SVTs but there was no evidence of deep venous insufficiency or superficial venous insufficiency in the greater saphenous or short saphenous veins bilaterally. I would wonder about more central venous issues or perhaps this is all lymphedema 01/25/2021 upon evaluation today patient appears to be doing decently well and guard to her wound. The good news is the Iodoflex that is job she saw Dr. Dellia Nims last week for readmission and to be honest I think that this has done extremely well over that week. I do believe that she can tolerate the sharp debridement today which will be good. I think that cleaning off the wound will likely allow [place] to be able to use a different type of dressing I do not think the Iodoflex will even be necessary based on how clean the wound looks. Fortunately there is no sign of active infection at this time which is great news. No fevers, chills, nausea, vomiting, or diarrhea. 02/12/2021 upon evaluation today patient appears to be doing well with regard to her leg ulcer. We did switch to alginate when she came for nurse visit I think is doing much better for her. Fortunately there does not appear to be any signs of active infection at this time which is great news. No fevers, chills, nausea, vomiting, or diarrhea. [name], [name] ([id number]) 02/18/2021 upon evaluation today patient's wound is actually showing signs of good improvement. I am very pleased with where things stand  currently. I do not see any signs of active infection which is great news and overall I feel like she is making good progress. The patient likewise is happy that things are doing so well and that this is measuring somewhat smaller. 02/25/2021 upon evaluation today patient actually appears to be doing quite well in regard to her wounds. She has been tolerating the  dressing changes without complication. Fortunately there does not appear to be any signs of active infection which is great news and overall I am extremely pleased with where things stand. No fevers, chills, nausea, vomiting, or diarrhea. She does have a lot of edema I really do think she needs compression socks to be worn in order to prevent things from causing additional issues for her currently. Especially on the right leg she should be wearing compression socks all the time to be honest. 03/04/2021 upon evaluation today patient's leg ulcer actually appears to be doing pretty well which is great news. There does not appear to be any significant change but overall the appearance is better even though the size is not necessarily reflecting a great improvement. Fortunately there does not appear to be any signs of active infection. No fevers, chills, nausea, vomiting, or diarrhea. 03/12/2021 upon evaluation today patient appears to be doing well with regard to her wounds. She has been tolerating the dressing changes without complication. The good news is that the wound on her left lateral leg is doing much better and is showing signs of good improvement. Overall her swelling is controlled well as well. She does need some compression socks she plans to go Jefferey socks next week. 03/18/2021 on evaluation today patient's wound actually is showing signs of good improvement. She does have a little bit of slough this can require some sharp debridement today. 03/25/2021 upon evaluation today patient appears to be doing well with regard to her wound on the left lateral leg. She has been tolerating the dressing changes without complication. Fortunately there does not appear to be any signs of active infection at this time. No fever chills no 04/01/2021 upon evaluation today patient's wound is showing signs of improvement and overall very pleased with where things stand at this point. There is no evidence of active  infection which is great news as well and in general I think that she is making great progress. 04/09/2021 upon evaluation today patient appears to be doing well with regard to her ankle ulcer. She is making good progress currently which is great news. There does not appear to be any signs of infection also excellent news. In general I am extremely pleased with where things stand today. No fevers, chills, nausea, vomiting, or diarrhea. 04/23/2021 upon inspection today patient appears to be doing quite well with regard to her leg ulcer. She has been tolerating the dressing changes without complication. She is can require some sharp debridement today but overall seems to be doing excellent. 05/06/2021 upon evaluation today patient unfortunately appears to be doing poorly overall in regard to her swelling. She is actually significantly more swollen than last week despite the fact that her wound is doing better this is not a good trend. I think that she probably needs to see her cardiologist ASAP and I discussed that with her today. This includes the fact that honestly I think she probably is getting need an increase in her diuretic or something to try to clear away some of the excess fluid that she is experiencing at the moment. She is  not been doing her pumps even twice a day but again right now more concerned that if she were to do the pump she would fluid overload her lungs causing other issues as far as her breathing is concerned. Obviously I think she needs to contact them and move up the appointment for the 12th of something sooner 2 weeks is a bit too long to wait with how swollen she has. 05/14/2021 upon evaluation today patient's wound actually showing signs of being a little bit smaller compared to previous. She has been making good progress however. Fortunately there does not appear to be any evidence of active infection which is great. Overall I am extremely pleased in that regard. Nonetheless I am  still concerned that she is having quite a bit of issue as far as the swelling is concerned she has been placed on fluid restriction as well as an increase in the furosemide by her doctor. We will see how things progress. 05/21/2021 upon evaluation today patient appears to be doing well with regard to her wound in general. She has been tolerating the dressing changes without complication. With that being said she has been using silver cell for a while and while this was showing signs of improvement it is now gotten to the point where this has slowed down quite significantly. I do believe that switching to a different dressing may be beneficial for her today. 05/28/2021 upon evaluation today patient actually appears to be doing quite well in regard to her wound. In fact there is really not any need for sharp debridement today based on what I see. The Hydrofera Blue is doing great and overall I think that she is managing quite nicely with the compression wrapping. Her edema is still down quite a bit with the wrapping in place. 06/11/2021 upon evaluation today patient appears to be doing well with regard to her leg ulcer. She has been tolerating the dressing changes without complication. Fortunately there does not appear to be any signs of active infection at this time which is great news. No fevers, chills, nausea, vomiting, or diarrhea. She is going require some sharp debridement today. 10/11; left leg ulcer much better looking and with improved surface area. She is using Hydrofera Blue under 3 layer compression. She does not have any stocking on the right leg which in itself has very significant nonpitting edema 06/25/2021 upon evaluation today patient appears to be doing well with regard to the wound on her left leg. She has been tolerating the dressing changes without complication and overall I am extremely pleased with where things stand today. I do not see any evidence of infection which is great  news. 07/02/2021 upon evaluation today patient's wound actually showing signs of excellent improvement and actually very pleased with where we stand today and the overall appearance. Fortunately there does not appear to be any signs of active infection. No fevers, chills, nausea, vomiting, or diarrhea. 07/09/2021 upon evaluation today patient appears to be doing better in regard to her wound this is measuring smaller and she is headed in the appropriate direction based on what I am seeing currently. I do not see any evidence of active infection systemically which is great news. 07/16/2021 upon evaluation today patient appears to be doing pretty well currently in regard to her leg ulcer. This is measuring smaller and looking much better this is great news. Fortunately there does not appear to be any evidence of active infection systemically which is great news as well.  07/23/2021 upon evaluation today patient's wound is actually showing signs of good improvement this is definitely measuring smaller. I do not see any signs of infection which is great news and overall very pleased in that regard. Overall I think that she is doing well with the [place], [place] ([id number]) [place]. 07/30/2020 upon evaluation today patient appears to be doing well in regard to her wound. She has been tolerating the dressing changes without complication think the Hydrofera Blue at this point is actually causing her to dry out a lot as far as the wound bed is concerned. Subsequently I think that we need to try to see what we can do to improve this. Fortunately there does not appear to be any evidence of active infection at this time which is great news. 08/06/2021 upon evaluation today patient appears to be doing well with regards to her wound. Is not measuring significantly smaller initial inspection but upon closer inspection she has a lot of new skin growth across the central portion of the wound. I think will  get very close to complete resolution. 08/13/2021 upon evaluation today patient appears to be doing well with regard to her wound. In fact this appears to be I believe healed although there is still some question as to whether it is completely so. I am concerned about the fact that to be honest she still has some evidence of dry skin that could be just trapping something underneath I still want to debride anything as I am afraid of causing some damage to the good skin for that reason I [name] probably monitor for 1 more week before completely closing everything out. 08/20/2021 upon evaluation today patient actually appears to be doing excellent. In fact she appears to be completely healed. This was the case last week as well we just wanted to make sure nothing reopened since she does not really have an ability to put on compression socks this is good to be something we need to make sure was well-healed. Nonetheless I think we have achieved that goal as of today. 12/27; this is a patient we discharged 2 weeks ago with wounds on her left lower leg laterally. She was supposed to get stockings and really did not get them. She developed blistering and reopening probably sometime late last week. She has 3 areas in the same area as previously. The mid calf dimensions on the left went from 39 to 51 cm today. She also has very significant edema on the right leg but as far as I am aware has not had wounds in this area 1/4; patient presents for follow-up. She has tolerated the compression wrap well. She has no issues or complaints today. 09/17/2021 upon evaluation today patient appears to be doing well with regard to her wounds. She has been tolerating the dressing changes without complication. Fortunately there does not appear to be any signs of active infection at this time. No fevers, chills, nausea, vomiting, or diarrhea. 10/01/2021 upon evaluation today patient appears to be doing well with regard to her  wound this is actually measuring better and looking smaller and I am very pleased in that regard. Fortunately I do not see any evidence of active infection locally nor systemically at this point. No fevers, chills, nausea, vomiting, or diarrhea. 10/08/2021 upon evaluation patient's wounds are actually showing some signs of improvement measuring a little bit smaller and looking a little bit better. Overall I do not think there is any need for sharp debridement today which  is good news. No fevers, chills, nausea, vomiting, or diarrhea. 10/14/2021 upon evaluation today patient appears to be doing well with regard to her wound. She has been tolerating the dressing changes without complication and overall I am extremely pleased with where things stand today. There is a little bit of film on the surface of the wounds but this was carefully cleaned away with just saline and gauze she really did not want me to do any sharp debridement today. 10/29/2021 upon evaluation today patient appears to be doing quite well in regard to her wounds. She is actually showing signs of excellent improvement which is great news and overall I feel like we are on the right track at this time. She does have a wound on both legs and she does need something compression wise when she heals for that reason we will get a go and see about ordering her bilateral juxta lite compression wraps. 11/05/2021 upon evaluation today patient appears to be doing well with regard to her wound. She has been tolerating the dressing changes without complication. Fortunately I do not see any evidence of active infection locally nor systemically at this time which is great news. No fevers, chills, nausea, vomiting, or diarrhea. 11/12/2021 upon evaluation today patient appears to be doing well with regard to her wounds. She is doing excellent on the left on the right this is not doing quite as well there is some slough and biofilm buildup. 3/14; patient  presents for follow-up. She has no issues or complaints today. She has tolerated the compression wraps well. 11/26/2021 upon evaluation today patient appears to be doing well with regard to her wounds. Both are showing signs of good improvement which is great news. I do not see any evidence of active infection and overall think she is healing quite nicely. 12/03/2021 upon evaluation today patient appears to be doing well with regard to her wounds. Both are showing signs of excellent improvement I am very pleased with where things stand and I think that she is making good progress here. Fortunately I do not see any signs of active infection locally or systemically which is great news. No fevers, chills, nausea, vomiting, or diarrhea. 12-10-2021 upon evaluation today patient's wounds actually appear to be doing awesome. I am extremely pleased with where we stand I think she is making excellent progress. I do not see any signs of active infection locally or systemically at this time. 12-17-2021 upon evaluation today patient's wounds are actually showing signs of good improvement. Fortunately I do not see any evidence of active infection locally nor systemically which is great news. Overall I do not believe that she is showing any signs of significant worsening which is good news. With that being said I do believe that she has a little bit of film on the surface of the wound I was actually able to clear this away with saline and gauze no sharp debridement was necessary. 12-24-2021 upon evaluation today patient appears to be doing well with regard to her wounds. I do not see any signs of active infection currently which is great news and overall I think that we are on the right track here. No fevers, chills, nausea, vomiting, or diarrhea. 12-31-2021 upon evaluation today patient appears to be doing well with regard to her wounds. Both are showing signs of improvement the wound on the right leg is going require  some sharp debridement on the left seems to be doing quite well. 01-07-2022 upon evaluation today patient  appears to be doing well currently in regard to her wounds. She is actually making good progress bilaterally. Both of them require little bit of sharp debridement but not too much which is great news. [name], [name] ([id number]) Upon inspection patient's wound bed actually showed signs of good granulation and epithelization on both legs. She still is having significant issues with the right leg compared to the left although I do feel like both are actually showing signs of pretty good improvement. 01-21-2022 upon evaluation today patient's wound is actually showing signs of excellent improvement I am very pleased with where we stand currently. I do not see any signs of active infection locally or systemically which is great news. No fevers, chills, nausea, vomiting, or diarrhea. 01-28-2022 upon evaluation today patient appears to be doing well with regard to her wound on the left leg this is pretty much just about healed which is great news. Unfortunately in regard to the wound on her right leg this is not doing nearly as well in fact I think we probably need to see about a culture today that was discussed with her. 02-04-2022 upon evaluation today patient appears to be doing well with regard to her left leg which in fact might actually be completely healed I am not 100% sure. Nonetheless this is shown signs of excellent improvement which is great news. With that being said in regards to the right leg there is some slough and film buildup on the surface of the wound although she is feeling much better than last week this does appear to be doing much better than it was last week. Fortunately there does not appear to be any signs of active infection locally or systemically at this time. 02-11-2022 upon evaluation today patient's wounds appear to be doing decently well. In fact the left leg is healed  although there is brand-new skin were definitely given still need to wrap her for the time being. On the right leg this is showing signs of some epithelial growth in the middle of the wound it still measures the same because there is still a larger area with speckled openings but again as far as the entire area being open this is significantly improved which is great news. I am very pleased. 6/13; left leg remains healed and she has a juxta light to apply to it today. There is some concern about whether she is going to be able to do this at home. She lives with a daughter although she has her own disability. On the right the wound looks smaller to me nice epithelialization good edema control. 02-25-2022 upon evaluation today patient appears to be doing well with regard to her legs the left leg is still healed the right leg is doing much better. I think we are on the right track here. With that being said unfortunately she continues to have significant issues with some other problems and she actually tells me on Saturday she was having lunch and went inside to sit down when she tells me she had all of a sudden a sharp sensation that went from her hand through her arm into her leg and her neck and she tells me that she could not see anything for the next hour it was completely black. Slowly after this her vision started to return and has been normal since. It sounds to me as if she may have had a TIA or mini stroke at this point. Nonetheless I think that she needs to have this  checked out ASAP. Her primary care provider is Dr. Clide Deutscher at [facility]. We will get a try to get in touch with him. 03-10-2022 upon evaluation today patient's wound actually showed signs of good improvement. She seems to be making excellent progress here. I do not see any evidence of active infection locally or systemically which is great news. No fevers, chills, nausea, vomiting, or diarrhea. She was in the hospital since have  seen her last she ended up finding out that she was having syncopal episodes. That is what was going on with the blacking out and losing vision. Subsequently they adjusted some of her medication she seems to be doing better. 03-18-2022 upon evaluation today patient's wound is actually showing signs of good granulation and epithelization at this point. Fortunately I do not see any signs of active infection locally or systemically which is great news. 03-25-2022 any evidence of active infection locally or systemically which is great news. Any evidence of active infection which is great news and overall the wound is looking better. Upon evaluation today patient appears to be doing well with regard to her leg ulcer. She has been tolerating the dressing changes without complication. Fortunately I do not see 7/25; the patient's wound is on the right lateral lower extremity using silver alginate 3 layer compression. She has chronic venous insufficiency and lymphedema. We are using a juxta lite on the left leg which does not have any open wounds but she is leaving this on all week it seems that there just is not a way to get this changed at home 04-08-2022 upon evaluation today patient appears to be doing well currently in regard to her wound. She has been tolerating the dressing changes without complication. Fortunately I do not see any evidence of active infection locally or systemically which is great news. No fever or chills noted her left leg has reopened at this point. 04-15-2022 upon evaluation patient's wounds actually are showing signs of improvement which is great news. Little by little I do believe her making progress here. In regard to the right leg this is looking better though the measurement does not speak to it there is a lot of new skin growth. On the left leg this is significantly smaller compared to last week most of the blistered area has completely closed. 04-22-2022 upon evaluation today  patient appears to be doing excellent in regard to her wounds both are showing signs of improvement. Her knees and above are still extremely swollen the legs were rewrapped them are down significantly. With that being said I do not think that we will get a be able to keep them that way once were not rapid noted that she really has no way to be able to put juxta lites on and off and nobody to help her with that. That is the biggest concern that we have here at this point. Fortunately I do not think that there is any evidence of infection which is great news. 04-29-2022 upon evaluation today patient appears to be doing well with regard to her wounds. She has been tolerating the dressing changes without complication. Fortunately there does not appear to be any evidence of infection locally or systemically which is great news and overall I am extremely pleased in that regard. Left leg is healed the right leg is very close we discussed today the possibility of her granddaughter helping with putting on and off compression wraps for her. 05-06-2022 upon evaluation today patient appears to be doing  well currently in regard to her left leg which is showing signs of being completely healed. With regard to her right leg she still has an open area though this is doing much better there is a lot less drainage than what we previously noted. Fortunately I see no evidence of active infection locally or systemically at this time which is great news. 05-20-2022 upon evaluation today patient appears to be doing excellent in regard to her leg. The left leg is still healed the right leg is much better. Fortunately I see no signs of infection locally or systemically at this time which is great news. No fevers, chills, nausea, vomiting, or diarrhea. 05-27-2022 upon evaluation today patient appears to be doing excellent in regard to her wound this is actually measuring smaller and looking much better. Fortunately I see no signs  of active infection locally or systemically at this time. 06-03-2022 upon evaluation patient's wound actually is measuring smaller and looking much better. She still has a lot of discomfort but fortunately [name], [name] ([id number]) nothing to significant at this point which is great news. No fevers, chills, nausea, vomiting, or diarrhea. I think we are making some pretty good progress here. Electronic Signature(s) Signed: 06/03/2022 3:16:33 PM By: Worthy Keeler PA-C Entered By: Worthy Keeler on 06/03/2022 15:16:33 [name] ([id number]) -------------------------------------------------------------------------------- Physical Exam Details Patient Name: [name] Date of Service: 06/03/2022 2:00 PM Medical Record Number: [id number] Patient Account Number: [id number] Date of Birth/Sex: 11/18/1945 (76 y.o. F) Treating RN: Carlene Coria Primary Care Provider: [name] Other Clinician: Referring Provider: [name] Treating Provider/Extender: Skipper Cliche in Treatment: 13 Constitutional Obese and well-hydrated in no acute distress. Respiratory normal breathing without difficulty. Psychiatric this patient is able to make decisions and demonstrates good insight into disease process. Alert and Oriented x 3. pleasant and cooperative. Notes Upon inspection patient's wound again showed signs of excellent improvement I did not perform debridement today due to the discomfort but the good news is she actually is showing signs of significant improvement overall and very little slough buildup I think we are on the right track. Electronic Signature(s) Signed: 06/03/2022 3:16:49 PM By: Worthy Keeler PA-C Entered By: Worthy Keeler on 06/03/2022 15:16:49 [name] ([id number]) -------------------------------------------------------------------------------- Physician Orders Details Patient Name: [name] Date of Service: 06/03/2022 2:00 PM Medical Record  Number: [id number] Patient Account Number: [id number] Date of Birth/Sex: 04-13-1946 (76 y.o. F) Treating RN: Carlene Coria Primary Care Provider: [name] Other Clinician: Referring Provider: [name] Treating Provider/Extender: Skipper Cliche in Treatment: 30 Verbal / Phone Orders: No Diagnosis Coding ICD-10 Coding Code Description I89.0 Lymphedema, not elsewhere classified I87.332 Chronic venous hypertension (idiopathic) with ulcer and inflammation of left lower extremity L97.812 Non-pressure chronic ulcer of other part of right lower leg with fat layer exposed Follow-up Appointments o Return Appointment in 1 week. o Nurse Visit as needed Bathing/ Shower/ Hygiene o May shower with wound dressing protected with water repellent cover or cast protector. o No tub bath. Anesthetic (Use 'Patient Medications' Section for Anesthetic Order Entry) o Lidocaine applied to wound bed Edema Control - Lymphedema / Segmental Compressive Device / Other Bilateral Lower Extremities o Optional: One layer of unna paste to top of compression wrap (to act as an anchor). - she needs this o 3 Layer Compression System for Lymphedema. - left o Elevate, Exercise Daily and Avoid Standing for Long Periods of Time. o Elevate legs to the level of the heart and pump ankles as often  as possible o Elevate leg(s) parallel to the floor when sitting. o Compression Pump: Use compression pump on left lower extremity for 60 minutes, twice daily. - You may pump over wraps o Compression Pump: Use compression pump on right lower extremity for 60 minutes, twice daily. - You may pump over wraps Additional Orders / Instructions o Follow Nutritious Diet and Increase Protein Intake Wound Treatment Wound #4 - Lower Leg Wound Laterality: Right, Lateral Cleanser: Soap and Water 1 x Per Week/30 Days Discharge Instructions: Gently cleanse wound with antibacterial soap, rinse and pat dry prior to  dressing wounds Cleanser: Wound Cleanser 1 x Per Week/30 Days Discharge Instructions: Wash your hands with soap and water. Remove old dressing, discard into plastic bag and place into trash. Cleanse the wound with Wound Cleanser prior to applying a clean dressing using gauze sponges, not tissues or cotton balls. Do not scrub or use excessive force. Pat dry using gauze sponges, not tissue or cotton balls. Primary Dressing: Silvercel Small 2x2 (in/in) 1 x Per Week/30 Days Discharge Instructions: Apply Silvercel Small 2x2 (in/in) as instructed Secondary Dressing: ABD Pad 5x9 (in/in) 1 x Per Week/30 Days Discharge Instructions: Cover with ABD pad Compression Wrap: 3-LAYER WRAP - Profore Lite LF 3 Multilayer Compression Bandaging System 1 x Per Week/30 Days Discharge Instructions: Apply 3 multi-layer wrap as prescribed. Compression Stockings: Circaid Juxta Lite Compression Wrap Right Leg Compression Amount: 20-30 mmHG Discharge Instructions: Apply Circaid Juxta Lite Compression Wrap as directed [name], [name] ([id number]) Electronic Signature(s) Signed: 06/03/2022 2:53:18 PM By: Carlene Coria RN Signed: 06/03/2022 4:52:08 PM By: Worthy Keeler PA-C Entered By: Carlene Coria on 06/03/2022 14:53:18 [name] ([id number]) -------------------------------------------------------------------------------- Problem List Details Patient Name: [name] Date of Service: 06/03/2022 2:00 PM Medical Record Number: [id number] Patient Account Number: [id number] Date of Birth/Sex: 1945-11-16 (76 y.o. F) Treating RN: Carlene Coria Primary Care Provider: [name] Other Clinician: Referring Provider: [name] Treating Provider/Extender: Skipper Cliche in Treatment: 71 Active Problems ICD-10 Encounter Code Description Active Date MDM Diagnosis I89.0 Lymphedema, not elsewhere classified 01/16/2021 No Yes I87.332 Chronic venous hypertension (idiopathic) with ulcer and inflammation of  01/16/2021 No Yes left lower extremity L97.812 Non-pressure chronic ulcer of other part of right lower leg with fat layer 01/07/2022 No Yes exposed Inactive Problems ICD-10 Code Description Active Date Inactive Date L97.822 Non-pressure chronic ulcer of other part of left lower leg with fat layer exposed 01/16/2021 01/16/2021 Resolved Problems Electronic Signature(s) Signed: 06/03/2022 2:16:51 PM By: Worthy Keeler PA-C Entered By: Worthy Keeler on 06/03/2022 14:16:50 [name] ([id number]) -------------------------------------------------------------------------------- Progress Note Details Patient Name: [name] Date of Service: 06/03/2022 2:00 PM Medical Record Number: [id number] Patient Account Number: [id number] Date of Birth/Sex: [birth year] (76 y.o. F) Treating RN: Carlene Coria Primary Care Provider: [name] Other Clinician: Referring Provider: [name] Treating Provider/Extender: Skipper Cliche in Treatment: 63 Subjective Chief Complaint Information obtained from Patient Bilateral LE Ulcers History of Present Illness (HPI) 03/15/2020 upon evaluation today patient presents today for initial evaluation here in our clinic concerning a wound that is on the left posterior lower extremity. Unfortunately this has been giving the patient some discomfort at this point which she notes has been affected her pretty much daily. The patient does have a history of venous insufficiency, hypertension, congestive heart failure, and a history of breast cancer. Fortunately there does not appear to be evidence of active infection at this time which is great news. No fevers, chills, nausea, vomiting, or diarrhea. Wound is  not extremely large but does have some slough covering the surface of the wound. This is going require sharp debridement today. 03/15/2020 upon evaluation today patient appears to be doing fairly well in regard to her wound. She does have some slough noted on  the surface of the wound currently but I do believe the Iodoflex has been beneficial over the past week. There is no signs of active infection at this time which is great news and overall very pleased with the progress. No fevers, chills, nausea, vomiting, or diarrhea. 04/02/2020 upon evaluation today patient appears to be doing a little better in regard to her wound size wise this is not tremendously smaller she does have some slough buildup on the surface of the wound. With that being said this is going require some sharp debridement today to clear away some of the necrotic debris. Fortunately there is no evidence of active infection at this time. No fevers, chills, nausea, vomiting, or diarrhea. 04/10/2020 on evaluation today patient appears to be doing well with regard to her ulcer which is measuring somewhat smaller today. She actually has more granulation tissue noted which is also good news is no need for sharp debridement today. Fortunately there is no evidence of infection either which is also excellent. No fevers, chills, nausea, vomiting, or diarrhea. 04/26/2020 on evaluation today patient's wound actually is showing signs of good improvement which is great news. There does not appear to be any evidence of active infection. I do believe she is tolerating the collagen at this point. 05/03/2020 upon evaluation today patient's wound actually showed signs of good granulation at this time there does not appear to be any evidence of active infection which is great news and overall very pleased with where things stand. With that being said I do believe that the patient is can require some sharp debridement today but fortunately nothing too significant. 05/11/20 on evaluation today patient appears to be doing well in regard to her leg ulcer. She's been tolerating the dressing changes without complication. Fortunately there is no signs of active infection at this time. Overall I'm very pleased with where  things stand. 05/18/2020 upon evaluation today patient's wound is showing signs of improvement is very dry and I think the collagen for the most part just dried to the wound bed. I am going to clean this away with sharp debridement in order to get this under control in my opinion. 05/25/2020 upon evaluation today patient actually appears to be doing well in regard to her leg ulcer. In fact upon inspection it appears she could potentially be completely healed but that is not guaranteed based on what we are seeing. There does not appear to be any signs of active infection which is great news. 05/31/2020 on evaluation today patient appears to be doing well with regard to her wounds. She in fact appears to be almost completely healed on the left leg there is just a very small opening remaining point 1 06/08/2020 upon evaluation today patient appears to be doing excellent in regard to her leg ulcer on the left in fact it appears to be that she is completely healed as of today. I see no signs of active infection at this time which is great news. No fevers, chills, nausea, vomiting, or diarrhea. READMISSION 01/16/2021 Patient that we have had in this clinic with a wound on the left posterior calf in the summer 2021 extending into October. She was felt to have chronic venous insufficiency. Per the patient's  description she was discharged with what sounds like external compression stockings although she could not afford the "$75 per leg". She therefore did not get anything. Apparently her wound that she has currently is been in existence since January. She has been followed at vein and vascular with Unna boots and I think calcium alginate. She had ABIs done on 06/25/2020 that showed an noncompressible ABI on the right leg and the left leg but triphasic waveforms with good great toe pressures and a TBI of 0.90 on the right and 0.91 on the left Surprisingly the patient also had venous reflux studies that were  really unremarkable. This included no evidence of DVTs or SVTs but there was no evidence of deep venous insufficiency or superficial venous insufficiency in the greater saphenous or short saphenous veins bilaterally. I would wonder about more central venous issues or perhaps this is all lymphedema 01/25/2021 upon evaluation today patient appears to be doing decently well and guard to her wound. The good news is the Iodoflex that is job she saw Dr. Dellia Nims last week for readmission and to be honest I think that this has done extremely well over that week. I do believe that she can tolerate the sharp debridement today which will be good. I think that cleaning off the wound will likely allow [place] to be able to use a different type of dressing I do not think the Iodoflex will even be necessary based on how clean the wound looks. Fortunately there is no sign of active infection at this time which is great news. No fevers, chills, nausea, vomiting, or diarrhea. [name], [name] ([id number]) 02/12/2021 upon evaluation today patient appears to be doing well with regard to her leg ulcer. We did switch to alginate when she came for nurse visit I think is doing much better for her. Fortunately there does not appear to be any signs of active infection at this time which is great news. No fevers, chills, nausea, vomiting, or diarrhea. 02/18/2021 upon evaluation today patient's wound is actually showing signs of good improvement. I am very pleased with where things stand currently. I do not see any signs of active infection which is great news and overall I feel like she is making good progress. The patient likewise is happy that things are doing so well and that this is measuring somewhat smaller. 02/25/2021 upon evaluation today patient actually appears to be doing quite well in regard to her wounds. She has been tolerating the dressing changes without complication. Fortunately there does not appear to be any signs of  active infection which is great news and overall I am extremely pleased with where things stand. No fevers, chills, nausea, vomiting, or diarrhea. She does have a lot of edema I really do think she needs compression socks to be worn in order to prevent things from causing additional issues for her currently. Especially on the right leg she should be wearing compression socks all the time to be honest. 03/04/2021 upon evaluation today patient's leg ulcer actually appears to be doing pretty well which is great news. There does not appear to be any significant change but overall the appearance is better even though the size is not necessarily reflecting a great improvement. Fortunately there does not appear to be any signs of active infection. No fevers, chills, nausea, vomiting, or diarrhea. 03/12/2021 upon evaluation today patient appears to be doing well with regard to her wounds. She has been tolerating the dressing changes without complication. The good news is  that the wound on her left lateral leg is doing much better and is showing signs of good improvement. Overall her swelling is controlled well as well. She does need some compression socks she plans to go Jefferey socks next week. 03/18/2021 on evaluation today patient's wound actually is showing signs of good improvement. She does have a little bit of slough this can require some sharp debridement today. 03/25/2021 upon evaluation today patient appears to be doing well with regard to her wound on the left lateral leg. She has been tolerating the dressing changes without complication. Fortunately there does not appear to be any signs of active infection at this time. No fever chills no 04/01/2021 upon evaluation today patient's wound is showing signs of improvement and overall very pleased with where things stand at this point. There is no evidence of active infection which is great news as well and in general I think that she is making great  progress. 04/09/2021 upon evaluation today patient appears to be doing well with regard to her ankle ulcer. She is making good progress currently which is great news. There does not appear to be any signs of infection also excellent news. In general I am extremely pleased with where things stand today. No fevers, chills, nausea, vomiting, or diarrhea. 04/23/2021 upon inspection today patient appears to be doing quite well with regard to her leg ulcer. She has been tolerating the dressing changes without complication. She is can require some sharp debridement today but overall seems to be doing excellent. 05/06/2021 upon evaluation today patient unfortunately appears to be doing poorly overall in regard to her swelling. She is actually significantly more swollen than last week despite the fact that her wound is doing better this is not a good trend. I think that she probably needs to see her cardiologist ASAP and I discussed that with her today. This includes the fact that honestly I think she probably is getting need an increase in her diuretic or something to try to clear away some of the excess fluid that she is experiencing at the moment. She is not been doing her pumps even twice a day but again right now more concerned that if she were to do the pump she would fluid overload her lungs causing other issues as far as her breathing is concerned. Obviously I think she needs to contact them and move up the appointment for the 12th of something sooner 2 weeks is a bit too long to wait with how swollen she has. 05/14/2021 upon evaluation today patient's wound actually showing signs of being a little bit smaller compared to previous. She has been making good progress however. Fortunately there does not appear to be any evidence of active infection which is great. Overall I am extremely pleased in that regard. Nonetheless I am still concerned that she is having quite a bit of issue as far as the swelling is  concerned she has been placed on fluid restriction as well as an increase in the furosemide by her doctor. We will see how things progress. 05/21/2021 upon evaluation today patient appears to be doing well with regard to her wound in general. She has been tolerating the dressing changes without complication. With that being said she has been using silver cell for a while and while this was showing signs of improvement it is now gotten to the point where this has slowed down quite significantly. I do believe that switching to a different dressing may be beneficial for  her today. 05/28/2021 upon evaluation today patient actually appears to be doing quite well in regard to her wound. In fact there is really not any need for sharp debridement today based on what I see. The Hydrofera Blue is doing great and overall I think that she is managing quite nicely with the compression wrapping. Her edema is still down quite a bit with the wrapping in place. 06/11/2021 upon evaluation today patient appears to be doing well with regard to her leg ulcer. She has been tolerating the dressing changes without complication. Fortunately there does not appear to be any signs of active infection at this time which is great news. No fevers, chills, nausea, vomiting, or diarrhea. She is going require some sharp debridement today. 10/11; left leg ulcer much better looking and with improved surface area. She is using Hydrofera Blue under 3 layer compression. She does not have any stocking on the right leg which in itself has very significant nonpitting edema 06/25/2021 upon evaluation today patient appears to be doing well with regard to the wound on her left leg. She has been tolerating the dressing changes without complication and overall I am extremely pleased with where things stand today. I do not see any evidence of infection which is great news. 07/02/2021 upon evaluation today patient's wound actually showing signs of  excellent improvement and actually very pleased with where we stand today and the overall appearance. Fortunately there does not appear to be any signs of active infection. No fevers, chills, nausea, vomiting, or diarrhea. 07/09/2021 upon evaluation today patient appears to be doing better in regard to her wound this is measuring smaller and she is headed in the appropriate direction based on what I am seeing currently. I do not see any evidence of active infection systemically which is great news. 07/16/2021 upon evaluation today patient appears to be doing pretty well currently in regard to her leg ulcer. This is measuring smaller and looking much better this is great news. Fortunately there does not appear to be any evidence of active infection systemically which is great news [name], [name] ([id number]) as well. 07/23/2021 upon evaluation today patient's wound is actually showing signs of good improvement this is definitely measuring smaller. I do not see any signs of infection which is great news and overall very pleased in that regard. Overall I think that she is doing well with the [facility]. 07/30/2020 upon evaluation today patient appears to be doing well in regard to her wound. She has been tolerating the dressing changes without complication think the Hydrofera Blue at this point is actually causing her to dry out a lot as far as the wound bed is concerned. Subsequently I think that we need to try to see what we can do to improve this. Fortunately there does not appear to be any evidence of active infection at this time which is great news. 08/06/2021 upon evaluation today patient appears to be doing well with regards to her wound. Is not measuring significantly smaller initial inspection but upon closer inspection she has a lot of new skin growth across the central portion of the wound. I think will get very close to complete resolution. 08/13/2021 upon evaluation today patient  appears to be doing well with regard to her wound. In fact this appears to be I believe healed although there is still some question as to whether it is completely so. I am concerned about the fact that to be honest she still has some evidence of  dry skin that could be just trapping something underneath I still want to debride anything as I am afraid of causing some damage to the good skin for that reason I [name] probably monitor for 1 more week before completely closing everything out. 08/20/2021 upon evaluation today patient actually appears to be doing excellent. In fact she appears to be completely healed. This was the case last week as well we just wanted to make sure nothing reopened since she does not really have an ability to put on compression socks this is good to be something we need to make sure was well-healed. Nonetheless I think we have achieved that goal as of today. 12/27; this is a patient we discharged 2 weeks ago with wounds on her left lower leg laterally. She was supposed to get stockings and really did not get them. She developed blistering and reopening probably sometime late last week. She has 3 areas in the same area as previously. The mid calf dimensions on the left went from 39 to 51 cm today. She also has very significant edema on the right leg but as far as I am aware has not had wounds in this area 1/4; patient presents for follow-up. She has tolerated the compression wrap well. She has no issues or complaints today. 09/17/2021 upon evaluation today patient appears to be doing well with regard to her wounds. She has been tolerating the dressing changes without complication. Fortunately there does not appear to be any signs of active infection at this time. No fevers, chills, nausea, vomiting, or diarrhea. 10/01/2021 upon evaluation today patient appears to be doing well with regard to her wound this is actually measuring better and looking smaller and I am very pleased in  that regard. Fortunately I do not see any evidence of active infection locally nor systemically at this point. No fevers, chills, nausea, vomiting, or diarrhea. 10/08/2021 upon evaluation patient's wounds are actually showing some signs of improvement measuring a little bit smaller and looking a little bit better. Overall I do not think there is any need for sharp debridement today which is good news. No fevers, chills, nausea, vomiting, or diarrhea. 10/14/2021 upon evaluation today patient appears to be doing well with regard to her wound. She has been tolerating the dressing changes without complication and overall I am extremely pleased with where things stand today. There is a little bit of film on the surface of the wounds but this was carefully cleaned away with just saline and gauze she really did not want me to do any sharp debridement today. 10/29/2021 upon evaluation today patient appears to be doing quite well in regard to her wounds. She is actually showing signs of excellent improvement which is great news and overall I feel like we are on the right track at this time. She does have a wound on both legs and she does need something compression wise when she heals for that reason we will get a go and see about ordering her bilateral juxta lite compression wraps. 11/05/2021 upon evaluation today patient appears to be doing well with regard to her wound. She has been tolerating the dressing changes without complication. Fortunately I do not see any evidence of active infection locally nor systemically at this time which is great news. No fevers, chills, nausea, vomiting, or diarrhea. 11/12/2021 upon evaluation today patient appears to be doing well with regard to her wounds. She is doing excellent on the left on the right this is not doing quite  as well there is some slough and biofilm buildup. 3/14; patient presents for follow-up. She has no issues or complaints today. She has tolerated the  compression wraps well. 11/26/2021 upon evaluation today patient appears to be doing well with regard to her wounds. Both are showing signs of good improvement which is great news. I do not see any evidence of active infection and overall think she is healing quite nicely. 12/03/2021 upon evaluation today patient appears to be doing well with regard to her wounds. Both are showing signs of excellent improvement I am very pleased with where things stand and I think that she is making good progress here. Fortunately I do not see any signs of active infection locally or systemically which is great news. No fevers, chills, nausea, vomiting, or diarrhea. 12-10-2021 upon evaluation today patient's wounds actually appear to be doing awesome. I am extremely pleased with where we stand I think she is making excellent progress. I do not see any signs of active infection locally or systemically at this time. 12-17-2021 upon evaluation today patient's wounds are actually showing signs of good improvement. Fortunately I do not see any evidence of active infection locally nor systemically which is great news. Overall I do not believe that she is showing any signs of significant worsening which is good news. With that being said I do believe that she has a little bit of film on the surface of the wound I was actually able to clear this away with saline and gauze no sharp debridement was necessary. 12-24-2021 upon evaluation today patient appears to be doing well with regard to her wounds. I do not see any signs of active infection currently which is great news and overall I think that we are on the right track here. No fevers, chills, nausea, vomiting, or diarrhea. 12-31-2021 upon evaluation today patient appears to be doing well with regard to her wounds. Both are showing signs of improvement the wound [name], [name] ([id number]) on the right leg is going require some sharp debridement on the left seems to be doing  quite well. 01-07-2022 upon evaluation today patient appears to be doing well currently in regard to her wounds. She is actually making good progress bilaterally. Both of them require little bit of sharp debridement but not too much which is great news. Upon inspection patient's wound bed actually showed signs of good granulation and epithelization on both legs. She still is having significant issues with the right leg compared to the left although I do feel like both are actually showing signs of pretty good improvement. 01-21-2022 upon evaluation today patient's wound is actually showing signs of excellent improvement I am very pleased with where we stand currently. I do not see any signs of active infection locally or systemically which is great news. No fevers, chills, nausea, vomiting, or diarrhea. 01-28-2022 upon evaluation today patient appears to be doing well with regard to her wound on the left leg this is pretty much just about healed which is great news. Unfortunately in regard to the wound on her right leg this is not doing nearly as well in fact I think we probably need to see about a culture today that was discussed with her. 02-04-2022 upon evaluation today patient appears to be doing well with regard to her left leg which in fact might actually be completely healed I am not 100% sure. Nonetheless this is shown signs of excellent improvement which is great news. With that being said in regards to  the right leg there is some slough and film buildup on the surface of the wound although she is feeling much better than last week this does appear to be doing much better than it was last week. Fortunately there does not appear to be any signs of active infection locally or systemically at this time. 02-11-2022 upon evaluation today patient's wounds appear to be doing decently well. In fact the left leg is healed although there is brand-new skin were definitely given still need to wrap her for the  time being. On the right leg this is showing signs of some epithelial growth in the middle of the wound it still measures the same because there is still a larger area with speckled openings but again as far as the entire area being open this is significantly improved which is great news. I am very pleased. 6/13; left leg remains healed and she has a juxta light to apply to it today. There is some concern about whether she is going to be able to do this at home. She lives with a daughter although she has her own disability. On the right the wound looks smaller to me nice epithelialization good edema control. 02-25-2022 upon evaluation today patient appears to be doing well with regard to her legs the left leg is still healed the right leg is doing much better. I think we are on the right track here. With that being said unfortunately she continues to have significant issues with some other problems and she actually tells me on Saturday she was having lunch and went inside to sit down when she tells me she had all of a sudden a sharp sensation that went from her hand through her arm into her leg and her neck and she tells me that she could not see anything for the next hour it was completely black. Slowly after this her vision started to return and has been normal since. It sounds to me as if she may have had a TIA or mini stroke at this point. Nonetheless I think that she needs to have this checked out ASAP. Her primary care provider is Dr. Clide Deutscher at [facility]. We will get a try to get in touch with him. 03-10-2022 upon evaluation today patient's wound actually showed signs of good improvement. She seems to be making excellent progress here. I do not see any evidence of active infection locally or systemically which is great news. No fevers, chills, nausea, vomiting, or diarrhea. She was in the hospital since have seen her last she ended up finding out that she was having syncopal episodes. That is  what was going on with the blacking out and losing vision. Subsequently they adjusted some of her medication she seems to be doing better. 03-18-2022 upon evaluation today patient's wound is actually showing signs of good granulation and epithelization at this point. Fortunately I do not see any signs of active infection locally or systemically which is great news. 03-25-2022 any evidence of active infection locally or systemically which is great news. Any evidence of active infection which is great news and overall the wound is looking better. Upon evaluation today patient appears to be doing well with regard to her leg ulcer. She has been tolerating the dressing changes without complication. Fortunately I do not see 7/25; the patient's wound is on the right lateral lower extremity using silver alginate 3 layer compression. She has chronic venous insufficiency and lymphedema. We are using a juxta lite on the  left leg which does not have any open wounds but she is leaving this on all week it seems that there just is not a way to get this changed at home 04-08-2022 upon evaluation today patient appears to be doing well currently in regard to her wound. She has been tolerating the dressing changes without complication. Fortunately I do not see any evidence of active infection locally or systemically which is great news. No fever or chills noted her left leg has reopened at this point. 04-15-2022 upon evaluation patient's wounds actually are showing signs of improvement which is great news. Little by little I do believe her making progress here. In regard to the right leg this is looking better though the measurement does not speak to it there is a lot of new skin growth. On the left leg this is significantly smaller compared to last week most of the blistered area has completely closed. 04-22-2022 upon evaluation today patient appears to be doing excellent in regard to her wounds both are showing signs of  improvement. Her knees and above are still extremely swollen the legs were rewrapped them are down significantly. With that being said I do not think that we will get a be able to keep them that way once were not rapid noted that she really has no way to be able to put juxta lites on and off and nobody to help her with that. That is the biggest concern that we have here at this point. Fortunately I do not think that there is any evidence of infection which is great news. 04-29-2022 upon evaluation today patient appears to be doing well with regard to her wounds. She has been tolerating the dressing changes without complication. Fortunately there does not appear to be any evidence of infection locally or systemically which is great news and overall I am extremely pleased in that regard. Left leg is healed the right leg is very close we discussed today the possibility of her granddaughter helping with putting on and off compression wraps for her. 05-06-2022 upon evaluation today patient appears to be doing well currently in regard to her left leg which is showing signs of being completely healed. With regard to her right leg she still has an open area though this is doing much better there is a lot less drainage than what we previously noted. Fortunately I see no evidence of active infection locally or systemically at this time which is great news. 05-20-2022 upon evaluation today patient appears to be doing excellent in regard to her leg. The left leg is still healed the right leg is much better. Fortunately I see no signs of infection locally or systemically at this time which is great news. No fevers, chills, nausea, vomiting, or diarrhea. 05-27-2022 upon evaluation today patient appears to be doing excellent in regard to her wound this is actually measuring smaller and looking [name], [name] ([id number]) much better. Fortunately I see no signs of active infection locally or systemically at this  time. 06-03-2022 upon evaluation patient's wound actually is measuring smaller and looking much better. She still has a lot of discomfort but fortunately nothing to significant at this point which is great news. No fevers, chills, nausea, vomiting, or diarrhea. I think we are making some pretty good progress here. Objective Constitutional Obese and well-hydrated in no acute distress. Vitals Time Taken: 2:20 PM, Height: 69 in, Weight: 244 lbs, BMI: 36, Temperature: 98.3 F, Pulse: 62 bpm, Respiratory Rate: 18 breaths/min, Blood Pressure: 168/73  mmHg. Respiratory normal breathing without difficulty. Psychiatric this patient is able to make decisions and demonstrates good insight into disease process. Alert and Oriented x 3. pleasant and cooperative. General Notes: Upon inspection patient's wound again showed signs of excellent improvement I did not perform debridement today due to the discomfort but the good news is she actually is showing signs of significant improvement overall and very little slough buildup I think we are on the right track. Integumentary (Hair, Skin) Wound #4 status is Open. Original cause of wound was Gradually Appeared. The date acquired was: 10/27/2021. The wound has been in treatment 31 weeks. The wound is located on the Right,Lateral Lower Leg. The wound measures 5cm length x 2.5cm width x 0.1cm depth; 9.817cm^2 area and 0.982cm^3 volume. There is Fat Layer (Subcutaneous Tissue) exposed. There is no tunneling or undermining noted. There is a medium amount of serosanguineous drainage noted. There is medium (34-66%) pink granulation within the wound bed. There is a medium (34-66%) amount of necrotic tissue within the wound bed including Adherent Slough. 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 There was a Three Layer Compression Therapy Procedure by Carlene Coria, RN. Post procedure Diagnosis Wound #4: Same as Pre-Procedure There was a Three Layer Compression Therapy Procedure by Carlene Coria, RN. Post procedure Diagnosis Wound #: Same as Pre-Procedure [name], [name] ([id number]) Plan Follow-up Appointments: Return Appointment in 1 week. Nurse Visit as needed Bathing/ Shower/ Hygiene: May shower with wound dressing protected with water repellent cover or cast protector. No tub bath. Anesthetic (Use 'Patient Medications' Section for Anesthetic Order Entry): Lidocaine applied to wound bed Edema Control - Lymphedema / Segmental Compressive Device / Other: Optional: One layer of unna paste to top of compression wrap (to act as an anchor). - she needs this 3 Layer Compression System for Lymphedema. - left Elevate, Exercise Daily and Avoid Standing for Long Periods of Time. Elevate legs to the level of the heart and pump ankles as often as possible Elevate leg(s) parallel to the floor when sitting. Compression Pump: Use compression pump on left lower extremity for 60 minutes, twice daily. - You may pump over wraps Compression Pump: Use compression pump on right lower extremity for 60 minutes, twice daily. - You may pump over wraps Additional Orders / Instructions: Follow Nutritious Diet and Increase Protein Intake WOUND #4: - Lower Leg Wound Laterality: Right, Lateral Cleanser: Soap and Water 1 x Per Week/30 Days Discharge Instructions: Gently cleanse wound with antibacterial soap, rinse and pat dry prior to dressing wounds Cleanser: Wound Cleanser 1 x Per Week/30 Days Discharge Instructions: Wash your hands with soap and water. Remove old dressing, discard into plastic bag and place into trash. Cleanse the wound with Wound Cleanser  prior to applying a clean dressing using gauze sponges, not tissues or cotton balls. Do not scrub or use excessive force. Pat dry using gauze sponges, not tissue or cotton balls. Primary Dressing: Silvercel Small 2x2 (in/in) 1 x Per Week/30 Days Discharge Instructions: Apply Silvercel Small 2x2 (in/in) as instructed Secondary Dressing: ABD Pad 5x9 (in/in) 1 x Per Week/30 Days Discharge Instructions: Cover with ABD pad Compression Wrap: 3-LAYER WRAP - Profore Lite LF 3 Multilayer Compression Bandaging System 1 x Per Week/30  Days Discharge Instructions: Apply 3 multi-layer wrap as prescribed. Compression Stockings: Circaid Juxta Lite Compression Wrap Compression Amount: 20-30 mmHg (right) Discharge Instructions: Apply Circaid Juxta Lite Compression Wrap as directed 1. I am going to suggest that we go ahead and continue with the recommendation for wound care measures as before and the patient is in agreement with plan. This includes the use of the 3 layer compression wrapping which I think is doing well. 2. I am also can recommend that we continue with the dressing which is silver cell followed by ABD pad. 3. I am also can recommend that the patient really needs to get into her juxta lite compression wraps. I am going to suggest that she have her granddaughter come with her next week to see if she can learn to put these on so that she will be able to do this 3 times a week on and off at home. She needs a plan for long-term as as she heals were not can to be able to continue this and she can just leave the Velcro wraps on all the time. She voiced understanding. We will see patient back for reevaluation in 1 week here in the clinic. If anything worsens or changes patient will contact our office for additional recommendations. Electronic Signature(s) Signed: 06/03/2022 3:17:45 PM By: Worthy Keeler PA-C Entered By: Worthy Keeler on 06/03/2022 15:17:45 [name]  ([id number]) -------------------------------------------------------------------------------- SuperBill Details Patient Name: [name] Date of Service: 06/03/2022 Medical Record Number: [id number] Patient Account Number: [id number] Date of Birth/Sex: 1946-03-26 (76 y.o. F) Treating RN: Carlene Coria Primary Care Provider: [name] Other Clinician: Referring Provider: [name] Treating Provider/Extender: Skipper Cliche in Treatment: 71 Diagnosis Coding ICD-10 Codes Code Description I89.0 Lymphedema, not elsewhere classified I87.332 Chronic venous hypertension (idiopathic) with ulcer and inflammation of left lower extremity L97.812 Non-pressure chronic ulcer of other part of right lower leg with fat layer exposed Facility Procedures CPT4 Code: [id number] Description: (Facility Use Only) [id number] - [place] LWR LT LEG Modifier: Quantity: 1 Physician Procedures CPT4 Code Description: [id number] - WC PHYS LEVEL 3 - EST PT Modifier: Quantity: 1 CPT4 Code Description: ICD-10 Diagnosis Description I89.0 Lymphedema, not elsewhere classified I87.332 Chronic venous hypertension (idiopathic) with ulcer and inflammation of L97.812 Non-pressure chronic ulcer of other part of right lower leg with fat  lay Modifier: left lower extremity er exposed Quantity: Electronic Signature(s) Signed: 06/03/2022 4:11:30 PM By: Worthy Keeler PA-C Previous Signature: 06/03/2022 2:54:34 PM Version By: Carlene Coria RN Entered By: Worthy Keeler on 06/03/2022 16:11:29

## 2022-06-05 ENCOUNTER — Telehealth: Payer: Self-pay | Admitting: Podiatry

## 2022-06-05 MED ORDER — ACETAMINOPHEN-CODEINE 300-30 MG PO TABS: 1.0000 | ORAL_TABLET | ORAL | 0 refills | Status: DC | PRN

## 2022-06-05 NOTE — Addendum Note (Signed)
Addended by: Boneta Lucks on: 06/05/2022 11:16 AM   Modules accepted: Orders

## 2022-06-05 NOTE — Telephone Encounter (Signed)
Pt states she seen Dr. Posey Pronto two weeks ago and now her R great toe is sore and painful. Pt wants to know what she can do to help with the pain.  Please advise

## 2022-06-06 ENCOUNTER — Other Ambulatory Visit: Payer: Self-pay | Admitting: Family Medicine

## 2022-06-06 DIAGNOSIS — R1900 Intra-abdominal and pelvic swelling, mass and lump, unspecified site: Secondary | ICD-10-CM

## 2022-06-06 DIAGNOSIS — N289 Disorder of kidney and ureter, unspecified: Secondary | ICD-10-CM

## 2022-06-09 ENCOUNTER — Telehealth: Payer: Self-pay | Admitting: Oncology

## 2022-06-09 ENCOUNTER — Ambulatory Visit (INDEPENDENT_AMBULATORY_CARE_PROVIDER_SITE_OTHER): Payer: Medicare Other | Admitting: Surgery

## 2022-06-09 ENCOUNTER — Encounter: Payer: Self-pay | Admitting: Surgery

## 2022-06-09 ENCOUNTER — Other Ambulatory Visit: Payer: Self-pay

## 2022-06-09 VITALS — BP 103/61 | HR 75 | Temp 99.4°F | Ht 69.0 in | Wt 245.0 lb

## 2022-06-09 DIAGNOSIS — Z170 Estrogen receptor positive status [ER+]: Secondary | ICD-10-CM | POA: Diagnosis not present

## 2022-06-09 DIAGNOSIS — Z08 Encounter for follow-up examination after completed treatment for malignant neoplasm: Secondary | ICD-10-CM | POA: Diagnosis not present

## 2022-06-09 DIAGNOSIS — C50411 Malignant neoplasm of upper-outer quadrant of right female breast: Secondary | ICD-10-CM

## 2022-06-09 NOTE — Telephone Encounter (Signed)
Called pt to confirm van appt for 06/10/2022, I wasn't able to reach pt. Called pt's daughter and confirmed pt still needs [name] services for appt.

## 2022-06-09 NOTE — Patient Instructions (Signed)
We will contact you in August 2024 to schedule a mammogram and breast exam for September 2024.  Please call with any questions or concerns.    Breast Self-Awareness Breast self-awareness means being familiar with how your breasts look and feel. It involves checking your breasts regularly and telling your health care provider about any changes. Practicing breast self-awareness helps to maintain breast health. Sometimes, changes are not harmful (are benign). Other times, a change in your breasts can be a sign of a serious medical problem. Being familiar with the look and feel of your breasts can help you catch a breast problem while it is still small and can be treated. You should do breast self-exams even if you have breast implants. What you need: A mirror. A well-lit room. A pillow or other soft object. How to do a breast self-exam A breast self-exam is one way to learn what is normal for your breasts and whether your breasts are changing. To do a breast self-exam: Look for changes  Remove all the clothing above your waist. Stand in front of a mirror in a room with good lighting. Put your hands down at your sides. Compare your breasts in the mirror. Look for differences between them (asymmetry), such as: Differences in shape. Differences in size. Puckers, dips, and bumps in one breast and not the other. Look at each breast for changes in the skin, such as: Redness. Scaly areas. Skin thickening. Dimpling. Open sores (ulcers). Look for changes in your nipples, such as: Discharge. Bleeding. Dimpling. Redness. A nipple that looks pushed in (retracted), or that has changed position. Feel for changes Carefully feel your breasts for lumps and changes. It is best to do this self-exam while lying down. Follow these steps to feel each breast: Place a pillow under the shoulder of one side of your body. Place the arm of that side of your body behind your head. Feel the breast of that side  of your body using the hand of the opposite arm. To do this: Start in the nipple area and use the pads of your three middle fingers to make -inch (2 cm) overlapping circles. Use light, medium, and then firm pressure as you feel your breast, gently covering the entire breast area and armpit. Continue the overlapping circles, moving downward over the breast until you feel your ribs below your breast. Then, make circles with your fingers going upward until you reach your collarbone. Next, make circles by moving outward across your breast and into your armpit area. Squeeze the nipple. Check for discharge and lumps. Repeat steps 1-7 to check your other breast. Sit or stand in the tub or shower. With soapy water on your skin, feel each breast the same way you did when you were lying down. Write down what you find Writing down what you find can help you remember what to discuss with your health care provider. Write down: What is normal for each breast. Any changes that you find in each breast. These include: The kind of changes you find. Any pain or tenderness. Size and location of any lumps. Where you are in your menstrual cycle, if you are still getting your menstrual period (menstruating). General tips If you are breastfeeding, the best time to examine your breasts is after a feeding or after using a breast pump. If you menstruate, the best time to examine your breasts is 5-7 days after your menstrual period. Breasts are generally lumpier during menstrual periods, and it may be more difficult  to notice changes. With time and practice, you will become more familiar with the differences in your breasts and more comfortable with the exam. Contact a health care provider if: You see a change in the shape or size of your breasts or nipples. You see a change in the skin of your breast or nipples, such as a reddened or scaly area. You have unusual discharge from your nipples. You find a new lump or  thick area. You have breast pain. You have any concerns about your breast health. Summary Breast self-awareness includes looking for physical changes in your breasts and feeling for any changes within your breasts. Breast self-awareness should be done in front of a mirror in a well-lit room. If you menstruate, the best time to examine your breasts is 5-7 days after your menstrual period. Tell your health care provider about any changes you notice in your breasts. Changes include changes in size, changes on the skin, pain or tenderness, or unusual fluid from your nipples. This information is not intended to replace advice given to you by your health care provider. Make sure you discuss any questions you have with your health care provider. Document Revised: 07/16/2021 Document Reviewed: 06/27/2021 Elsevier Patient Education  [place].

## 2022-06-09 NOTE — Progress Notes (Signed)
06/09/2022  History of Present Illness: [name] is a 76 y.o. female s/p right breast lumpectomy and sentinel lymph node biopsy in October 2019 for right breast cancer.  She completed adjuvant radiation and is currently on Arimidex.  Her subsequent yearly mammograms have been negative without any suspicious findings.  Her last mammogram was on 05/26/22 which also did not reveal any suspicious findings.  Patient reports she's doing well, without any new breast complaints or symptoms.  Denies any breast pain, palpable masses, nipple changes, or drainage.  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    Breast cancer ([place]) 2019   right breast   CHF (congestive heart failure) ([place])    March 28, 2017 echo: EF 50-55%; 1) echo 07/2011: EF 20-25%, mild concentric hypertrophy, diffuse HK, regional WMA cannot be excluded, grade 1 DD, mitral valve with mild regurg, LA mildly dilated). 2) EF 40-45%, mild AS,                     Complication of anesthesia 2006   pt states "hard to wake up" after L breast surgery    Edema    GERD (gastroesophageal reflux disease)    Headache    Heart murmur    Hypertension    Hypokinesis    global, EF 35-45 % from Echo.   Malignant neoplasm of breast (female), unspecified site 06/14/2018   RIGHT T1b, N0; ER/PR positive, HER-2 negative.   Neuropathy    Obesity    Pain in joint, site unspecified    Personal history of chemotherapy 2006   left breast   Personal history of radiation therapy 2019   right breast   Personal history of radiation therapy 2006   left breast   Pneumonia    Sleep apnea    Tinea pedis    Unspecified hearing loss    bilateral     Past Surgical History: Past Surgical History:  Procedure Laterality Date   BREAST BIOPSY Right 2019   11  oc-INVASIVE MAMMARY CARCINOMA, NO SPECIAL TYPE   BREAST BIOPSY Right 2019   1030 oc- neg   BREAST LUMPECTOMY  2006   left breast    CATARACT EXTRACTION     left eye   HERNIA REPAIR  05/25/2006   Incarcerated omentum in ventral hernia, Ventralight mesh, combined lap/ open with omental resection.    PARTIAL MASTECTOMY WITH NEEDLE LOCALIZATION Right 06/14/2018   Procedure: PARTIAL MASTECTOMY WITH NEEDLE LOCALIZATION;  Surgeon: Robert Bellow, MD;  Location: [facility] ORS;  Service: General;  Laterality: Right;   PORT-A-CATH REMOVAL  05/25/2006   SENTINEL NODE BIOPSY Right 06/14/2018   Procedure: SENTINEL NODE BIOPSY;  Surgeon: Robert Bellow, MD;  Location: [facility] ORS;  Service: General;  Laterality: Right;   TUBAL LIGATION      Home Medications: Prior to Admission medications   Medication Sig Start Date End Date Taking? Authorizing Provider  acetaminophen-codeine (TYLENOL #3) 300-30 MG tablet Take 1-2 tablets by mouth every 4 (four) hours as needed for moderate pain. 06/05/22  Yes Felipa Furnace, DPM  anastrozole (ARIMIDEX) 1 MG tablet TAKE 1 TABLET BY MOUTH ONCE DAILY. PTAIENT MUST MAKE AN APPOINTMENT TO SEE DR RAO 04/29/22  Yes Sindy Guadeloupe, MD  aspirin EC 81 MG tablet Take 81 mg by mouth  daily.   Yes [provider]  cloNIDine (CATAPRES) 0.1 MG tablet Take 1 tablet (0.1 mg total) by mouth 2 (two) times daily. 03/06/22  Yes Fritzi Mandes, MD  [employer] CALCIUM + D3 600-20 MG-MCG TABS TAKE 1 TABLET BY MOUTH TWICE A DAY 02/04/22  Yes Sindy Guadeloupe, MD  diclofenac Sodium (VOLTAREN) 1 % GEL Apply 1 application topically as needed. 04/27/20  Yes [provider]  Docusate Sodium 100 MG capsule Take 100 mg by mouth 2 (two) times daily as needed. 02/13/22  Yes [provider]  Incontinence Supply Disposable (BLADDER CONTROL PADS EX ABSORB) MISC Apply 1 Dose topically as needed.  03/22/18  Yes [provider]  loratadine (CLARITIN) 10 MG tablet Take 10 mg by mouth daily.  07/20/19  Yes [provider]  losartan (COZAAR) 25 MG tablet Take 1 tablet (25 mg total) by mouth daily. 08/05/21  Yes Minna Merritts, MD  MIRALAX 17 GM/SCOOP powder SMARTSIG:2 Scoopful By Mouth Daily PRN 04/10/21  Yes [provider]  omeprazole (PRILOSEC) 40 MG capsule Take 40 mg by mouth daily. 04/13/21  Yes [provider]  oxybutynin (DITROPAN) 5 MG tablet Take 5 mg by mouth 2 (two) times daily.   Yes [provider]  potassium chloride SA (KLOR-CON) 20 MEQ tablet Take 1 tablet (20 mEq total) by mouth 2 (two) times daily. 08/05/21  Yes Gollan, Kathlene November, MD  Torsemide 40 MG TABS Take 40 mg by mouth daily in the afternoon. 03/06/22  Yes Fritzi Mandes, MD  triamcinolone cream (KENALOG) 0.1 % Apply 1 application topically 2 (two) times daily. Back of legs 03/21/19  Yes [provider]  isosorbide mononitrate (IMDUR) 60 MG 24 hr tablet Take 1 tablet (60 mg total) by mouth 2 (two) times daily. 04/20/13 03/03/22  Minna Merritts, MD    Allergies: No Known Allergies  Review of Systems: Review of Systems  Constitutional:  Negative for chills and fever.  Respiratory:  Negative for shortness of breath.   Cardiovascular:  Negative for chest pain.  Gastrointestinal:  Negative for abdominal pain, nausea and vomiting.    Physical Exam BP 103/61   Pulse 75   Temp 99.4 F (37.4 C) (Oral)   Ht 5' 9"  (1.753 m)   Wt 245 lb (111.1 kg)   SpO2 (!) 75%   BMI 36.18 kg/m  CONSTITUTIONAL: No acute distress HEENT:  Normocephalic, atraumatic, extraocular motion intact. RESPIRATORY:  Normal respiratory effort without pathologic use of accessory muscles. CARDIOVASCULAR: Regular rhythm and rate. BREAST:  Right breast s/p lumpectomy in upper outer quadrant, with incision well healed.  No palpable masses, skin changes other than radiation changes/thickness, or nipple changes.  No right axillary lymphadenopathy.  Left breast s/p lumpectomy with well healed scar, no  palpable masses, skin changes, or nipple changes.  No left axillary lymphadenopathy. NEUROLOGIC:  Motor and sensation is grossly normal.  Cranial nerves are grossly intact. PSYCH:  Alert and oriented to person, place and time. Affect is normal.  Labs/Imaging: Mammogram 05/26/22: FINDINGS: There are no findings suspicious for malignancy.   IMPRESSION: No mammographic evidence of malignancy. A result letter of this screening mammogram will be mailed directly to the patient.   RECOMMENDATION: Screening mammogram in one year. (Code:SM-B-01Y)   BI-RADS CATEGORY  1: Negative.  Assessment and Plan: This is a 76 y.o. female s/p right breast lumpectomy in 2019 and left breast lumpectomy in 2006.  --Discussed with the patient that there are no suspicious findings on mammogram  and her exam today is reassuring as well.  No procedures or biopsies are needed.   --Patient will follow up with me in a year with repeat bilateral screening mammogram.  I spent 20 minutes dedicated to the care of this patient on the date of this encounter to include pre-visit review of records, face-to-face time with the patient discussing diagnosis and management, and any post-visit coordination of care.   Melvyn Neth, [facility]

## 2022-06-10 ENCOUNTER — Encounter: Payer: Self-pay | Admitting: Oncology

## 2022-06-10 ENCOUNTER — Inpatient Hospital Stay: Payer: Medicare Other | Attending: Oncology | Admitting: Oncology

## 2022-06-10 ENCOUNTER — Encounter: Payer: Medicare Other | Attending: Physician Assistant | Admitting: Physician Assistant

## 2022-06-10 ENCOUNTER — Inpatient Hospital Stay: Payer: Medicare Other

## 2022-06-10 VITALS — BP 148/52 | HR 74 | Temp 99.2°F | Resp 20

## 2022-06-10 DIAGNOSIS — Z853 Personal history of malignant neoplasm of breast: Secondary | ICD-10-CM

## 2022-06-10 DIAGNOSIS — I130 Hypertensive heart and chronic kidney disease with heart failure and stage 1 through stage 4 chronic kidney disease, or unspecified chronic kidney disease: Secondary | ICD-10-CM | POA: Diagnosis not present

## 2022-06-10 DIAGNOSIS — Z79811 Long term (current) use of aromatase inhibitors: Secondary | ICD-10-CM | POA: Diagnosis not present

## 2022-06-10 DIAGNOSIS — Z08 Encounter for follow-up examination after completed treatment for malignant neoplasm: Secondary | ICD-10-CM | POA: Diagnosis not present

## 2022-06-10 DIAGNOSIS — I509 Heart failure, unspecified: Secondary | ICD-10-CM | POA: Diagnosis not present

## 2022-06-10 DIAGNOSIS — I890 Lymphedema, not elsewhere classified: Secondary | ICD-10-CM | POA: Insufficient documentation

## 2022-06-10 DIAGNOSIS — C50411 Malignant neoplasm of upper-outer quadrant of right female breast: Secondary | ICD-10-CM | POA: Diagnosis not present

## 2022-06-10 DIAGNOSIS — L97812 Non-pressure chronic ulcer of other part of right lower leg with fat layer exposed: Secondary | ICD-10-CM | POA: Insufficient documentation

## 2022-06-10 DIAGNOSIS — I872 Venous insufficiency (chronic) (peripheral): Secondary | ICD-10-CM | POA: Diagnosis not present

## 2022-06-10 DIAGNOSIS — Z170 Estrogen receptor positive status [ER+]: Secondary | ICD-10-CM | POA: Diagnosis not present

## 2022-06-10 DIAGNOSIS — N1831 Chronic kidney disease, stage 3a: Secondary | ICD-10-CM | POA: Diagnosis not present

## 2022-06-10 DIAGNOSIS — Z5181 Encounter for therapeutic drug level monitoring: Secondary | ICD-10-CM

## 2022-06-10 NOTE — Progress Notes (Signed)
Hematology/Oncology Consult note [facility]  [phone] [phone]  Patient Care Team: Donnie Coffin, MD as PCP - General (Family Medicine) Minna Merritts, MD as Consulting Physician (Cardiology)   Name of the patient: [name]  [id number]  02-Aug-1946   Date of visit: 06/10/22  Diagnosis-stage I right breast cancer  Chief complaint/ Reason for visit-routine follow-up of breast cancer  Heme/Onc history: patient is a 76 year old female with a prior history of left breast cancer in 2006.  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  Mammogram and ultrasound in August 2019 showed 0.9 x 0.9 x 0.8 cm irregular spiculated mass in the 11 o'clock position of the right breast.  There was a concern for an abnormal intramammary lymph node as well.  No other abnormal axillary adenopathy noted.  Patient underwent ultrasound-guided biopsy of both the right breast mass as well as the axillary lymph node lymph node biopsy was negative for malignancy.  Biopsy of the right breast mass showed 6 mm, grade 1 ER PR positive and HER-2/neu negative tumor.   Patient is morbidly obese and has difficulty with ambulation.  She uses a cane or a walker to ambulate around the house.  She sees Dr. Rockey Situ for her cardiology issues.  Reports that her appetite is good and she denies any unintentional weight loss or any new aches or pains anywhere.  Family history significant for breast cancer in her sister who died from breast cancer in her 63s.  genetic testing was negative   Final pathology showed 7 mm grade 2 invasive mammary carcinoma ER PR positive her 2 negative. Negative nodes. Clear margins   Oncotype score came as low risk 8 and she does not require adjuvant chemotherapy She completed adjuvant radiation and started Arimidex in December 2019    Interval history-patient reports being compliantWith  Arimidex.  Denies any breast concerns.  She has chronic fatigue and bilateral lower extremity wounds.  ECOG PS- 3 Pain scale- 0   Review of systems- Review of Systems  Constitutional:  Positive for malaise/fatigue. Negative for chills, fever and weight loss.  HENT:  Negative for congestion, ear discharge and nosebleeds.   Eyes:  Negative for blurred vision.  Respiratory:  Negative for cough, hemoptysis, sputum production, shortness of breath and wheezing.   Cardiovascular:  Negative for chest pain, palpitations, orthopnea and claudication.  Gastrointestinal:  Negative for abdominal pain, blood in stool, constipation, diarrhea, heartburn, melena, nausea and vomiting.  Genitourinary:  Negative for dysuria, flank pain, frequency, hematuria and urgency.  Musculoskeletal:  Negative for back pain, joint pain and myalgias.       Bilateral lower extremity wounds  Skin:  Negative for rash.  Neurological:  Negative for dizziness, tingling, focal weakness, seizures, weakness and headaches.  Endo/Heme/Allergies:  Does not bruise/bleed easily.  Psychiatric/Behavioral:  Negative for depression and suicidal ideas. The patient does not have insomnia.       No Known Allergies   Past Medical History:  Diagnosis Date   Abnormality of gait    Breast cancer ([place]) 02/05/2005   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    Breast cancer ([place]) 2019   right breast   CHF (congestive heart failure) ([place])    March 28, 2017 echo: EF 50-55%; 1) echo 07/2011: EF 20-25%, mild concentric hypertrophy, diffuse HK, regional WMA cannot be excluded, grade 1 DD,  mitral valve with mild regurg, LA mildly dilated). 2) EF 40-45%, mild AS,                     Complication of anesthesia 2006   pt states "hard to wake up" after L breast surgery    Edema    GERD (gastroesophageal reflux disease)    Headache    Heart murmur    Hypertension     Hypokinesis    global, EF 35-45 % from Echo.   Malignant neoplasm of breast (female), unspecified site 06/14/2018   RIGHT T1b, N0; ER/PR positive, HER-2 negative.   Neuropathy    Obesity    Pain in joint, site unspecified    Personal history of chemotherapy 2006   left breast   Personal history of radiation therapy 2019   right breast   Personal history of radiation therapy 2006   left breast   Pneumonia    Sleep apnea    Tinea pedis    Unspecified hearing loss    bilateral     Past Surgical History:  Procedure Laterality Date   BREAST BIOPSY Right 2019   11 oc-INVASIVE MAMMARY CARCINOMA, NO SPECIAL TYPE   BREAST BIOPSY Right 2019   1030 oc- neg   BREAST LUMPECTOMY  2006   left breast    CATARACT EXTRACTION     left eye   HERNIA REPAIR  05/25/2006   Incarcerated omentum in ventral hernia, Ventralight mesh, combined lap/ open with omental resection.    PARTIAL MASTECTOMY WITH NEEDLE LOCALIZATION Right 06/14/2018   Procedure: PARTIAL MASTECTOMY WITH NEEDLE LOCALIZATION;  Surgeon: Robert Bellow, MD;  Location: [facility] ORS;  Service: General;  Laterality: Right;   PORT-A-CATH REMOVAL  05/25/2006   SENTINEL NODE BIOPSY Right 06/14/2018   Procedure: SENTINEL NODE BIOPSY;  Surgeon: Robert Bellow, MD;  Location: [facility] ORS;  Service: General;  Laterality: Right;   TUBAL LIGATION      Social History   Socioeconomic History   Marital status: Divorced    Spouse name: Not on file   Number of children: Not on file   Years of education: Not on file   Highest education level: Not on file  Occupational History   Occupation: retired  Tobacco Use   Smoking status: Former    Packs/day: 0.50    Years: 30.00    Total pack years: 15.00    Types: Cigarettes    Quit date: 07/14/2000    Years since quitting: 21.9   Smokeless tobacco: Never  Vaping Use   Vaping Use: Never used  Substance and Sexual Activity   Alcohol use: No   Drug use: Not Currently    Types: Marijuana     Comment: endorses prior marijuana use 1999   Sexual activity: Never  Other Topics Concern   Not on file  Social History Narrative   Not on file   Social Determinants of Health   Financial Resource Strain: Not on file  Food Insecurity: Not on file  Transportation Needs: Not on file  Physical Activity: Not on file  Stress: Not on file  Social Connections: Not on file  Intimate Partner Violence: Not on file    Family History  Problem Relation Age of Onset   Hypertension Mother        deceased 5   Cataracts Mother    Breast cancer Other 51       maternal half-sister; deceased 6   Coronary artery disease Other  Current Outpatient Medications:    acetaminophen-codeine (TYLENOL #3) 300-30 MG tablet, Take 1-2 tablets by mouth every 4 (four) hours as needed for moderate pain., Disp: 30 tablet, Rfl: 0   anastrozole (ARIMIDEX) 1 MG tablet, TAKE 1 TABLET BY MOUTH ONCE DAILY. PTAIENT MUST MAKE AN APPOINTMENT TO SEE DR Aleks Nawrot, Disp: 90 tablet, Rfl: 0   aspirin EC 81 MG tablet, Take 81 mg by mouth daily., Disp: , Rfl:    cloNIDine (CATAPRES) 0.1 MG tablet, Take 1 tablet (0.1 mg total) by mouth 2 (two) times daily., Disp: 60 tablet, Rfl: 11   [employer] CALCIUM + D3 600-20 MG-MCG TABS, TAKE 1 TABLET BY MOUTH TWICE A DAY, Disp: 120 tablet, Rfl: 1   diclofenac Sodium (VOLTAREN) 1 % GEL, Apply 1 application topically as needed., Disp: , Rfl:    Docusate Sodium 100 MG capsule, Take 100 mg by mouth 2 (two) times daily as needed., Disp: , Rfl:    Incontinence Supply Disposable (BLADDER CONTROL PADS EX ABSORB) MISC, Apply 1 Dose topically as needed. , Disp: , Rfl:    loratadine (CLARITIN) 10 MG tablet, Take 10 mg by mouth daily., Disp: , Rfl:    losartan (COZAAR) 25 MG tablet, Take 1 tablet (25 mg total) by mouth daily., Disp: 90 tablet, Rfl: 3   methocarbamol (ROBAXIN) 500 MG tablet, Take by mouth., Disp: , Rfl:    MIRALAX 17 GM/SCOOP powder, SMARTSIG:2 Scoopful By Mouth Daily PRN, Disp: , Rfl:     omeprazole (PRILOSEC) 40 MG capsule, Take 40 mg by mouth daily., Disp: , Rfl:    oxybutynin (DITROPAN) 5 MG tablet, Take 5 mg by mouth 2 (two) times daily., Disp: , Rfl:    potassium chloride SA (KLOR-CON) 20 MEQ tablet, Take 1 tablet (20 mEq total) by mouth 2 (two) times daily., Disp: 180 tablet, Rfl: 3   Torsemide 40 MG TABS, Take 40 mg by mouth daily in the afternoon., Disp: 30 tablet, Rfl: 0   triamcinolone cream (KENALOG) 0.1 %, Apply 1 application topically 2 (two) times daily. Back of legs, Disp: , Rfl:    isosorbide mononitrate (IMDUR) 60 MG 24 hr tablet, Take 1 tablet (60 mg total) by mouth 2 (two) times daily., Disp: 180 tablet, Rfl: 3  Physical exam:  Vitals:   06/10/22 1417  BP: (!) 148/52  Pulse: 74  Resp: 20  Temp: 99.2 F (37.3 C)  SpO2: 95%   Physical Exam Constitutional:      Comments: Sitting in a wheelchair.  Appears in no acute distress  Cardiovascular:     Rate and Rhythm: Normal rate and regular rhythm.     Heart sounds: Normal heart sounds.  Pulmonary:     Effort: Pulmonary effort is normal.     Breath sounds: Normal breath sounds.  Abdominal:     General: Bowel sounds are normal.     Palpations: Abdomen is soft.  Skin:    General: Skin is warm and dry.  Neurological:     Mental Status: She is alert and oriented to person, place, and time.   Breast exam: Patient is s/p bilateral lumpectomy.  No palpable masses in either breast.  No palpable bilateral axillary adenopathy.     Latest Ref Rng & Units 03/05/2022    3:24 AM  CMP  Glucose 70 - 99 mg/dL 89   BUN 8 - 23 mg/dL 22   Creatinine 0.44 - 1.00 mg/dL 1.21   Sodium 135 - 145 mmol/L 138   Potassium 3.5 -  5.1 mmol/L 4.4   Chloride 98 - 111 mmol/L 107   CO2 22 - 32 mmol/L 27   Calcium 8.9 - 10.3 mg/dL 8.7       Latest Ref Rng & Units 03/05/2022    3:24 AM  CBC  WBC 4.0 - 10.5 K/uL 6.7   Hemoglobin 12.0 - 15.0 g/dL 10.3   Hematocrit 36.0 - 46.0 % 32.8   Platelets 150 - 400 K/uL 288     No  images are attached to the encounter.  CT ABDOMEN PELVIS W CONTRAST  Result Date: 05/30/2022 CLINICAL DATA:  Generalized abdominal pain. EXAM: CT ABDOMEN AND PELVIS WITH CONTRAST TECHNIQUE: Multidetector CT imaging of the abdomen and pelvis was performed using the standard protocol following bolus administration of intravenous contrast. RADIATION DOSE REDUCTION: This exam was performed according to the departmental dose-optimization program which includes automated exposure control, adjustment of the mA and/or kV according to patient size and/or use of iterative reconstruction technique. CONTRAST:  48m OMNIPAQUE IOHEXOL 300 MG/ML  SOLN COMPARISON:  Remote CT 08/22/2012 FINDINGS: Lower chest: No basilar airspace disease or pleural effusion. There is mild multi chamber cardiomegaly. No pericardial effusion. There are coronary artery calcifications. Hepatobiliary: 10 mm cyst in the central liver is stable from remote prior exam and considered benign. No further follow-up is needed. No suspicious liver lesion. The gallbladder is decompressed, no calcified gallstone or pericholecystic inflammation. Please note that detailed assessment is limited by motion artifact through this region no biliary dilatation. Pancreas: Unremarkable. No pancreatic ductal dilatation or surrounding inflammatory changes. Spleen: Normal in size without focal abnormality. Adrenals/Urinary Tract: No adrenal nodule. There is a simple 4 cm cyst in the lateral upper right kidney that needs no further follow-up. More inferiorly in the lower lateral right kidney is a 19 mm lesion that is isodense to adjacent renal parenchyma. Minimal left renal atrophy. No renal calculi. Partially distended unremarkable urinary bladder. Stomach/Bowel: Small hiatal hernia. Physiologic distention of the stomach. There is no bowel obstruction or inflammatory change. Normal appendix visualized in the right abdomen. Cecum is slightly high-riding. There is moderate  stool in the ascending, transverse and proximal descending colon. Colonic redundancy particularly of the transverse colon. Sigmoid colon is decompressed and not well assessed. Vascular/Lymphatic: Atherosclerosis of the abdominal aorta. No aneurysm. There is atherosclerosis involving the origin of the branch vessels. Patent portal vein. No abdominal or retroperitoneal adenopathy. There prominent and mildly enlarged bilateral inguinal lymph nodes, 15 mm on the left series 2, image 95, 14 and 12 mm on the right series 2, images 94 and 82 respectively. Prominent nodes are seen on remote prior exam, however lymph nodes have increased in size since that exam. Reproductive: Enlarged lobulated uterus typical of fibroids, poorly defined on the current exam. 2.2 cm soft tissue density structure in the right low pelvis series 2, image 67, may represent the ovary, however is nonspecific. This is slightly prominent in size for age if it is ovarian. Left ovary is tentatively visualized and quiescent. Other: No ascites. No omental thickening. Prior lower ventral abdominal hernia repair with mesh, however the mesh appears redundant. There is a small fat containing umbilical hernia that is not fixated by mesh. Musculoskeletal: Grade 1 anterolisthesis of L4 on L5 is likely facet mediated. There is diffuse degenerative disc disease and facet hypertrophy in the spine. Moderate right and mild left hip osteoarthritis. There are no acute or suspicious osseous abnormalities. IMPRESSION: 1. No acute abnormality in the abdomen/pelvis. 2. Enlarged lobulated uterus typical  of fibroids, poorly defined on the current exam. There is a 2.2 cm soft tissue density structure in the right low pelvis which may represent the ovary, however is nonspecific. This is slightly prominent in size for age if it is ovarian. Recommend initial evaluation with pelvic ultrasound, however pelvic MRI may ultimately be needed to define the uterus and adnexal  structures. 3. Prominent and mildly enlarged bilateral inguinal lymph nodes, nonspecific but likely reactive. 4. Indeterminate 19 mm lesion in the lower lateral right kidney. Recommend renal protocol MRI for characterization. There is an additional simple cyst in the right kidney that needs no specific imaging follow-up. 5. Prior lower ventral abdominal hernia repair with mesh, however the mesh appears redundant. There is a small fat containing umbilical hernia that is not fixated by mesh. 6. Small hiatal hernia. Aortic Atherosclerosis (ICD10-I70.0). Electronically Signed   By: Keith Rake M.D.   On: 05/30/2022 17:24   MM 3D SCREEN BREAST BILATERAL  Result Date: 05/27/2022 CLINICAL DATA:  Screening. EXAM: DIGITAL SCREENING BILATERAL MAMMOGRAM WITH TOMOSYNTHESIS AND CAD TECHNIQUE: Bilateral screening digital craniocaudal and mediolateral oblique mammograms were obtained. Bilateral screening digital breast tomosynthesis was performed. The images were evaluated with computer-aided detection. COMPARISON:  Previous exam(s). ACR Breast Density Category b: There are scattered areas of fibroglandular density. FINDINGS: There are no findings suspicious for malignancy. IMPRESSION: No mammographic evidence of malignancy. A result letter of this screening mammogram will be mailed directly to the patient. RECOMMENDATION: Screening mammogram in one year. (Code:SM-B-01Y) BI-RADS CATEGORY  1: Negative. Electronically Signed   By: Audie Pinto M.D.   On: 05/27/2022 16:47   DG Bone Density  Result Date: 05/26/2022 EXAM: DUAL X-RAY ABSORPTIOMETRY (DXA) FOR BONE MINERAL DENSITY IMPRESSION: Your patient Mehek Grega completed a BMD test on 05/26/2022 using the [facility] (software version: 14.10) manufactured by [employer]. The following summarizes the results of our evaluation. Technologist:VLM PATIENT BIOGRAPHICAL: Name: [place], [place] Patient ID: [id number] Birth Date: Dec 30, 1945  Height: 69.0 in. Gender: Female Exam Date: 05/26/2022 Weight: 230.6 lbs. Indications: COPD, Advanced Age, Breast CA, History of Chemo, Postmenopausal Fractures: Treatments: Arimidex, Calcium DENSITOMETRY RESULTS: Site      Region      Measured Date Measured Age WHO Classification Young Adult T-score BMD         %Change vs. Previous Significant Change (*) AP Spine L1-L4 (L2) 05[age]26/2022 76.5 Normal -0.3 1.148 g/cm2 -4.7% Yes AP Spine L1-L4 (L2) 05/23/2020 74.5 Normal 0.1 1.205 g/cm2 -0.6% - AP Spine L1-L4 (L2) 06/02/2018 72.5 Normal 0.2 1.212 g/cm2 - - DualFemur Total Right 05/26/2022 76.5 Normal -0.9 0.896 g/cm2 7.2% Yes DualFemur Total Right 05/23/2020 74.5 Osteopenia -1.4 0.836 g/cm2 -18.8% Yes DualFemur Total Right 06/02/2018 72.5 Normal 0.2 1.029 g/cm2 - - DualFemur Total Mean 05/26/2022 76.5 Normal -0.8 0.902 g/cm2 2.7% Yes DualFemur Total Mean 05/23/2020 74.5 Normal -1.0 0.878 g/cm2 -12.3% Yes DualFemur Total Mean 06/02/2018 72.5 Normal -0.1 1.001 g/cm2 - - ASSESSMENT: The BMD measured at Femur Total Right is 0.896 g/cm2 with a T-score of -0.9. This patient is considered normal according to [facility]) criteria. Compared with prior study, there has been significant decrease in the spine. Compared with prior study, there has been significant increas in the total hip. The scan quality was limited due to decreased range of motion for the hips. World Health Organization [facility]) criteria for post-menopausal, Caucasian Women: Normal:  T-score at or above -1 SD Osteopenia/low bone mass: T-score between -1 and -2.5 SD Osteoporosis:             T-score at or below -2.5 SD RECOMMENDATIONS: 1. All patients should optimize calcium and vitamin D intake. 2. Consider FDA-approved medical therapies in postmenopausal women and men aged 20 years and older, based on the following: a. A hip or vertebral(clinical or morphometric) fracture b. T-score < -2.5 at the femoral neck or spine after  appropriate evaluation to exclude secondary causes c. Low bone mass (T-score between -1.0 and -2.5 at the femoral neck or spine) and a 10-year probability of a hip fracture > 3% or a 10-year probability of a major osteoporosis-related fracture > 20% based on the [place]-adapted WHO algorithm 3. Clinician judgment and/or patient preferences may indicate treatment for people with 10-year fracture probabilities above or below these levels FOLLOW-UP: People with diagnosed cases of osteoporosis or at high risk for fracture should have regular bone mineral density tests. For patients eligible for Medicare, routine testing is allowed once every 2 years. The testing frequency can be increased to one year for patients who have rapidly progressing disease, those who are receiving or discontinuing medical therapy to restore bone mass, or have additional risk factors. I have reviewed this report, and agree with the above findings. [facility] Electronically Signed   By: Ammie Ferrier M.D.   On: 05/26/2022 15:21     Assessment and plan- Patient is a 76 y.o. female with history of left breast cancer in 2006 and then right breast cancer stage I ER/PR positive HER2 negative in 2019 here for routine follow-up  Breast exam was somewhat limited as patient is unable to sit on examination table.  I do not palpate any concerning breast masses.  Her recent mammogram was also normal.  Patient will continue Arimidex until December 2024.  Her bone density scan continues to be normal.  I will see her back in 6 months no labs  Patient also had a CT abdomen pelvis with contrast for abdominal pain in September 2023.  She was noted to have a soft tissue density in the right lower pelvis which was nonspecific and may represent ovary.  Also noted to have a 19 mm lesion in the right kidney and nonspecific inguinal adenopathy.  She does have a follow-up MRI for these findings which has been ordered by Dr. Clide Deutscher.  If there are  any concerning findings for malignancy patient can be referred to [place]   Visit Diagnosis 1. Visit for monitoring Arimidex therapy   2. Encounter for follow-up surveillance of breast cancer      Dr. Randa Evens, MD, MPH [facility] [phone] 06/10/2022 3:39 PM

## 2022-06-10 NOTE — Progress Notes (Signed)
[name], [name] ([id number]) Visit Report for 06/10/2022 Chief Complaint Document Details Patient Name: [name], [name] Date of Service: 06/10/2022 3:30 PM Medical Record Number: [id number] Patient Account Number: [id number] Date of Birth/Sex: 08/15/1946 (76 y.o. F) Treating RN: Carlene Coria Primary Care Provider: [name] Other Clinician: Referring Provider: [name] Treating Provider/Extender: Skipper Cliche in Treatment: 72 Information Obtained from: Patient Chief Complaint Bilateral LE Ulcers Electronic Signature(s) Signed: 06/10/2022 3:44:01 PM By: Worthy Keeler PA-C Entered By: Worthy Keeler on 06/10/2022 15:44:01 [name] ([id number]) -------------------------------------------------------------------------------- HPI Details Patient Name: [name] Date of Service: 06/10/2022 3:30 PM Medical Record Number: [id number] Patient Account Number: [id number] Date of Birth/Sex: 06/12/1946 (76 y.o. F) Treating RN: Carlene Coria Primary Care Provider: [name] Other Clinician: Referring Provider: [name] Treating Provider/Extender: Skipper Cliche in Treatment: 72 History of Present Illness HPI Description: 03/15/2020 upon evaluation today patient presents today for initial evaluation here in our clinic concerning a wound that is on the left posterior lower extremity. Unfortunately this has been giving the patient some discomfort at this point which she notes has been affected her pretty much daily. The patient does have a history of venous insufficiency, hypertension, congestive heart failure, and a history of breast cancer. Fortunately there does not appear to be evidence of active infection at this time which is great news. No fevers, chills, nausea, vomiting, or diarrhea. Wound is not extremely large but does have some slough covering the surface of the wound. This is going require sharp debridement today. 03/15/2020 upon evaluation today patient  appears to be doing fairly well in regard to her wound. She does have some slough noted on the surface of the wound currently but I do believe the Iodoflex has been beneficial over the past week. There is no signs of active infection at this time which is great news and overall very pleased with the progress. No fevers, chills, nausea, vomiting, or diarrhea. 04/02/2020 upon evaluation today patient appears to be doing a little better in regard to her wound size wise this is not tremendously smaller she does have some slough buildup on the surface of the wound. With that being said this is going require some sharp debridement today to clear away some of the necrotic debris. Fortunately there is no evidence of active infection at this time. No fevers, chills, nausea, vomiting, or diarrhea. 04/10/2020 on evaluation today patient appears to be doing well with regard to her ulcer which is measuring somewhat smaller today. She actually has more granulation tissue noted which is also good news is no need for sharp debridement today. Fortunately there is no evidence of infection either which is also excellent. No fevers, chills, nausea, vomiting, or diarrhea. 04/26/2020 on evaluation today patient's wound actually is showing signs of good improvement which is great news. There does not appear to be any evidence of active infection. I do believe she is tolerating the collagen at this point. 05/03/2020 upon evaluation today patient's wound actually showed signs of good granulation at this time there does not appear to be any evidence of active infection which is great news and overall very pleased with where things stand. With that being said I do believe that the patient is can require some sharp debridement today but fortunately nothing too significant. 05/11/20 on evaluation today patient appears to be doing well in regard to her leg ulcer. She's been tolerating the dressing changes without complication.  Fortunately there is no signs of active infection at this time.  Overall I'm very pleased with where things stand. 05/18/2020 upon evaluation today patient's wound is showing signs of improvement is very dry and I think the collagen for the most part just dried to the wound bed. I am going to clean this away with sharp debridement in order to get this under control in my opinion. 05/25/2020 upon evaluation today patient actually appears to be doing well in regard to her leg ulcer. In fact upon inspection it appears she could potentially be completely healed but that is not guaranteed based on what we are seeing. There does not appear to be any signs of active infection which is great news. 05/31/2020 on evaluation today patient appears to be doing well with regard to her wounds. She in fact appears to be almost completely healed on the left leg there is just a very small opening remaining point 1 06/08/2020 upon evaluation today patient appears to be doing excellent in regard to her leg ulcer on the left in fact it appears to be that she is completely healed as of today. I see no signs of active infection at this time which is great news. No fevers, chills, nausea, vomiting, or diarrhea. READMISSION 01/16/2021 Patient that we have had in this clinic with a wound on the left posterior calf in the summer 2021 extending into October. She was felt to have chronic venous insufficiency. Per the patient's description she was discharged with what sounds like external compression stockings although she could not afford the "$75 per leg". She therefore did not get anything. Apparently her wound that she has currently is been in existence since January. She has been followed at vein and vascular with Unna boots and I think calcium alginate. She had ABIs done on 06/25/2020 that showed an noncompressible ABI on the right leg and the left leg but triphasic waveforms with good great toe pressures and a TBI of 0.90 on the  right and 0.91 on the left Surprisingly the patient also had venous reflux studies that were really unremarkable. This included no evidence of DVTs or SVTs but there was no evidence of deep venous insufficiency or superficial venous insufficiency in the greater saphenous or short saphenous veins bilaterally. I would wonder about more central venous issues or perhaps this is all lymphedema 01/25/2021 upon evaluation today patient appears to be doing decently well and guard to her wound. The good news is the Iodoflex that is job she saw Dr. Dellia Nims last week for readmission and to be honest I think that this has done extremely well over that week. I do believe that she can tolerate the sharp debridement today which will be good. I think that cleaning off the wound will likely allow [place] to be able to use a different type of dressing I do not think the Iodoflex will even be necessary based on how clean the wound looks. Fortunately there is no sign of active infection at this time which is great news. No fevers, chills, nausea, vomiting, or diarrhea. 02/12/2021 upon evaluation today patient appears to be doing well with regard to her leg ulcer. We did switch to alginate when she came for nurse visit I think is doing much better for her. Fortunately there does not appear to be any signs of active infection at this time which is great news. No fevers, chills, nausea, vomiting, or diarrhea. [name], [name] ([id number]) 02/18/2021 upon evaluation today patient's wound is actually showing signs of good improvement. I am very pleased with where things stand  currently. I do not see any signs of active infection which is great news and overall I feel like she is making good progress. The patient likewise is happy that things are doing so well and that this is measuring somewhat smaller. 02/25/2021 upon evaluation today patient actually appears to be doing quite well in regard to her wounds. She has been tolerating the  dressing changes without complication. Fortunately there does not appear to be any signs of active infection which is great news and overall I am extremely pleased with where things stand. No fevers, chills, nausea, vomiting, or diarrhea. She does have a lot of edema I really do think she needs compression socks to be worn in order to prevent things from causing additional issues for her currently. Especially on the right leg she should be wearing compression socks all the time to be honest. 03/04/2021 upon evaluation today patient's leg ulcer actually appears to be doing pretty well which is great news. There does not appear to be any significant change but overall the appearance is better even though the size is not necessarily reflecting a great improvement. Fortunately there does not appear to be any signs of active infection. No fevers, chills, nausea, vomiting, or diarrhea. 03/12/2021 upon evaluation today patient appears to be doing well with regard to her wounds. She has been tolerating the dressing changes without complication. The good news is that the wound on her left lateral leg is doing much better and is showing signs of good improvement. Overall her swelling is controlled well as well. She does need some compression socks she plans to go Jefferey socks next week. 03/18/2021 on evaluation today patient's wound actually is showing signs of good improvement. She does have a little bit of slough this can require some sharp debridement today. 03/25/2021 upon evaluation today patient appears to be doing well with regard to her wound on the left lateral leg. She has been tolerating the dressing changes without complication. Fortunately there does not appear to be any signs of active infection at this time. No fever chills no 04/01/2021 upon evaluation today patient's wound is showing signs of improvement and overall very pleased with where things stand at this point. There is no evidence of active  infection which is great news as well and in general I think that she is making great progress. 04/09/2021 upon evaluation today patient appears to be doing well with regard to her ankle ulcer. She is making good progress currently which is great news. There does not appear to be any signs of infection also excellent news. In general I am extremely pleased with where things stand today. No fevers, chills, nausea, vomiting, or diarrhea. 04/23/2021 upon inspection today patient appears to be doing quite well with regard to her leg ulcer. She has been tolerating the dressing changes without complication. She is can require some sharp debridement today but overall seems to be doing excellent. 05/06/2021 upon evaluation today patient unfortunately appears to be doing poorly overall in regard to her swelling. She is actually significantly more swollen than last week despite the fact that her wound is doing better this is not a good trend. I think that she probably needs to see her cardiologist ASAP and I discussed that with her today. This includes the fact that honestly I think she probably is getting need an increase in her diuretic or something to try to clear away some of the excess fluid that she is experiencing at the moment. She is  not been doing her pumps even twice a day but again right now more concerned that if she were to do the pump she would fluid overload her lungs causing other issues as far as her breathing is concerned. Obviously I think she needs to contact them and move up the appointment for the 12th of something sooner 2 weeks is a bit too long to wait with how swollen she has. 05/14/2021 upon evaluation today patient's wound actually showing signs of being a little bit smaller compared to previous. She has been making good progress however. Fortunately there does not appear to be any evidence of active infection which is great. Overall I am extremely pleased in that regard. Nonetheless I am  still concerned that she is having quite a bit of issue as far as the swelling is concerned she has been placed on fluid restriction as well as an increase in the furosemide by her doctor. We will see how things progress. 05/21/2021 upon evaluation today patient appears to be doing well with regard to her wound in general. She has been tolerating the dressing changes without complication. With that being said she has been using silver cell for a while and while this was showing signs of improvement it is now gotten to the point where this has slowed down quite significantly. I do believe that switching to a different dressing may be beneficial for her today. 05/28/2021 upon evaluation today patient actually appears to be doing quite well in regard to her wound. In fact there is really not any need for sharp debridement today based on what I see. The Hydrofera Blue is doing great and overall I think that she is managing quite nicely with the compression wrapping. Her edema is still down quite a bit with the wrapping in place. 06/11/2021 upon evaluation today patient appears to be doing well with regard to her leg ulcer. She has been tolerating the dressing changes without complication. Fortunately there does not appear to be any signs of active infection at this time which is great news. No fevers, chills, nausea, vomiting, or diarrhea. She is going require some sharp debridement today. 10/11; left leg ulcer much better looking and with improved surface area. She is using Hydrofera Blue under 3 layer compression. She does not have any stocking on the right leg which in itself has very significant nonpitting edema 06/25/2021 upon evaluation today patient appears to be doing well with regard to the wound on her left leg. She has been tolerating the dressing changes without complication and overall I am extremely pleased with where things stand today. I do not see any evidence of infection which is great  news. 07/02/2021 upon evaluation today patient's wound actually showing signs of excellent improvement and actually very pleased with where we stand today and the overall appearance. Fortunately there does not appear to be any signs of active infection. No fevers, chills, nausea, vomiting, or diarrhea. 07/09/2021 upon evaluation today patient appears to be doing better in regard to her wound this is measuring smaller and she is headed in the appropriate direction based on what I am seeing currently. I do not see any evidence of active infection systemically which is great news. 07/16/2021 upon evaluation today patient appears to be doing pretty well currently in regard to her leg ulcer. This is measuring smaller and looking much better this is great news. Fortunately there does not appear to be any evidence of active infection systemically which is great news as well.  07/23/2021 upon evaluation today patient's wound is actually showing signs of good improvement this is definitely measuring smaller. I do not see any signs of infection which is great news and overall very pleased in that regard. Overall I think that she is doing well with the Synai Prettyman, [place] ([id number]) [place]. 07/30/2020 upon evaluation today patient appears to be doing well in regard to her wound. She has been tolerating the dressing changes without complication think the Hydrofera Blue at this point is actually causing her to dry out a lot as far as the wound bed is concerned. Subsequently I think that we need to try to see what we can do to improve this. Fortunately there does not appear to be any evidence of active infection at this time which is great news. 08/06/2021 upon evaluation today patient appears to be doing well with regards to her wound. Is not measuring significantly smaller initial inspection but upon closer inspection she has a lot of new skin growth across the central portion of the wound. I think will  get very close to complete resolution. 08/13/2021 upon evaluation today patient appears to be doing well with regard to her wound. In fact this appears to be I believe healed although there is still some question as to whether it is completely so. I am concerned about the fact that to be honest she still has some evidence of dry skin that could be just trapping something underneath I still want to debride anything as I am afraid of causing some damage to the good skin for that reason I Georgina Peer probably monitor for 1 more week before completely closing everything out. 08/20/2021 upon evaluation today patient actually appears to be doing excellent. In fact she appears to be completely healed. This was the case last week as well we just wanted to make sure nothing reopened since she does not really have an ability to put on compression socks this is good to be something we need to make sure was well-healed. Nonetheless I think we have achieved that goal as of today. 12/27; this is a patient we discharged 2 weeks ago with wounds on her left lower leg laterally. She was supposed to get stockings and really did not get them. She developed blistering and reopening probably sometime late last week. She has 3 areas in the same area as previously. The mid calf dimensions on the left went from 39 to 51 cm today. She also has very significant edema on the right leg but as far as I am aware has not had wounds in this area 1/4; patient presents for follow-up. She has tolerated the compression wrap well. She has no issues or complaints today. 09/17/2021 upon evaluation today patient appears to be doing well with regard to her wounds. She has been tolerating the dressing changes without complication. Fortunately there does not appear to be any signs of active infection at this time. No fevers, chills, nausea, vomiting, or diarrhea. 10/01/2021 upon evaluation today patient appears to be doing well with regard to her  wound this is actually measuring better and looking smaller and I am very pleased in that regard. Fortunately I do not see any evidence of active infection locally nor systemically at this point. No fevers, chills, nausea, vomiting, or diarrhea. 10/08/2021 upon evaluation patient's wounds are actually showing some signs of improvement measuring a little bit smaller and looking a little bit better. Overall I do not think there is any need for sharp debridement today which  is good news. No fevers, chills, nausea, vomiting, or diarrhea. 10/14/2021 upon evaluation today patient appears to be doing well with regard to her wound. She has been tolerating the dressing changes without complication and overall I am extremely pleased with where things stand today. There is a little bit of film on the surface of the wounds but this was carefully cleaned away with just saline and gauze she really did not want me to do any sharp debridement today. 10/29/2021 upon evaluation today patient appears to be doing quite well in regard to her wounds. She is actually showing signs of excellent improvement which is great news and overall I feel like we are on the right track at this time. She does have a wound on both legs and she does need something compression wise when she heals for that reason we will get a go and see about ordering her bilateral juxta lite compression wraps. 11/05/2021 upon evaluation today patient appears to be doing well with regard to her wound. She has been tolerating the dressing changes without complication. Fortunately I do not see any evidence of active infection locally nor systemically at this time which is great news. No fevers, chills, nausea, vomiting, or diarrhea. 11/12/2021 upon evaluation today patient appears to be doing well with regard to her wounds. She is doing excellent on the left on the right this is not doing quite as well there is some slough and biofilm buildup. 3/14; patient  presents for follow-up. She has no issues or complaints today. She has tolerated the compression wraps well. 11/26/2021 upon evaluation today patient appears to be doing well with regard to her wounds. Both are showing signs of good improvement which is great news. I do not see any evidence of active infection and overall think she is healing quite nicely. 12/03/2021 upon evaluation today patient appears to be doing well with regard to her wounds. Both are showing signs of excellent improvement I am very pleased with where things stand and I think that she is making good progress here. Fortunately I do not see any signs of active infection locally or systemically which is great news. No fevers, chills, nausea, vomiting, or diarrhea. 12-10-2021 upon evaluation today patient's wounds actually appear to be doing awesome. I am extremely pleased with where we stand I think she is making excellent progress. I do not see any signs of active infection locally or systemically at this time. 12-17-2021 upon evaluation today patient's wounds are actually showing signs of good improvement. Fortunately I do not see any evidence of active infection locally nor systemically which is great news. Overall I do not believe that she is showing any signs of significant worsening which is good news. With that being said I do believe that she has a little bit of film on the surface of the wound I was actually able to clear this away with saline and gauze no sharp debridement was necessary. 12-24-2021 upon evaluation today patient appears to be doing well with regard to her wounds. I do not see any signs of active infection currently which is great news and overall I think that we are on the right track here. No fevers, chills, nausea, vomiting, or diarrhea. 12-31-2021 upon evaluation today patient appears to be doing well with regard to her wounds. Both are showing signs of improvement the wound on the right leg is going require  some sharp debridement on the left seems to be doing quite well. 01-07-2022 upon evaluation today patient  appears to be doing well currently in regard to her wounds. She is actually making good progress bilaterally. Both of them require little bit of sharp debridement but not too much which is great news. [place], [place] ([id number]) Upon inspection patient's wound bed actually showed signs of good granulation and epithelization on both legs. She still is having significant issues with the right leg compared to the left although I do feel like both are actually showing signs of pretty good improvement. 01-21-2022 upon evaluation today patient's wound is actually showing signs of excellent improvement I am very pleased with where we stand currently. I do not see any signs of active infection locally or systemically which is great news. No fevers, chills, nausea, vomiting, or diarrhea. 01-28-2022 upon evaluation today patient appears to be doing well with regard to her wound on the left leg this is pretty much just about healed which is great news. Unfortunately in regard to the wound on her right leg this is not doing nearly as well in fact I think we probably need to see about a culture today that was discussed with her. 02-04-2022 upon evaluation today patient appears to be doing well with regard to her left leg which in fact might actually be completely healed I am not 100% sure. Nonetheless this is shown signs of excellent improvement which is great news. With that being said in regards to the right leg there is some slough and film buildup on the surface of the wound although she is feeling much better than last week this does appear to be doing much better than it was last week. Fortunately there does not appear to be any signs of active infection locally or systemically at this time. 02-11-2022 upon evaluation today patient's wounds appear to be doing decently well. In fact the left leg is healed  although there is brand-new skin were definitely given still need to wrap her for the time being. On the right leg this is showing signs of some epithelial growth in the middle of the wound it still measures the same because there is still a larger area with speckled openings but again as far as the entire area being open this is significantly improved which is great news. I am very pleased. 6/13; left leg remains healed and she has a juxta light to apply to it today. There is some concern about whether she is going to be able to do this at home. She lives with a daughter although she has her own disability. On the right the wound looks smaller to me nice epithelialization good edema control. 02-25-2022 upon evaluation today patient appears to be doing well with regard to her legs the left leg is still healed the right leg is doing much better. I think we are on the right track here. With that being said unfortunately she continues to have significant issues with some other problems and she actually tells me on Saturday she was having lunch and went inside to sit down when she tells me she had all of a sudden a sharp sensation that went from her hand through her arm into her leg and her neck and she tells me that she could not see anything for the next hour it was completely black. Slowly after this her vision started to return and has been normal since. It sounds to me as if she may have had a TIA or mini stroke at this point. Nonetheless I think that she needs to have this  checked out ASAP. Her primary care provider is Dr. Clide Deutscher at [facility]. We will get a try to get in touch with him. 03-10-2022 upon evaluation today patient's wound actually showed signs of good improvement. She seems to be making excellent progress here. I do not see any evidence of active infection locally or systemically which is great news. No fevers, chills, nausea, vomiting, or diarrhea. She was in the hospital since have  seen her last she ended up finding out that she was having syncopal episodes. That is what was going on with the blacking out and losing vision. Subsequently they adjusted some of her medication she seems to be doing better. 03-18-2022 upon evaluation today patient's wound is actually showing signs of good granulation and epithelization at this point. Fortunately I do not see any signs of active infection locally or systemically which is great news. 03-25-2022 any evidence of active infection locally or systemically which is great news. Any evidence of active infection which is great news and overall the wound is looking better. Upon evaluation today patient appears to be doing well with regard to her leg ulcer. She has been tolerating the dressing changes without complication. Fortunately I do not see 7/25; the patient's wound is on the right lateral lower extremity using silver alginate 3 layer compression. She has chronic venous insufficiency and lymphedema. We are using a juxta lite on the left leg which does not have any open wounds but she is leaving this on all week it seems that there just is not a way to get this changed at home 04-08-2022 upon evaluation today patient appears to be doing well currently in regard to her wound. She has been tolerating the dressing changes without complication. Fortunately I do not see any evidence of active infection locally or systemically which is great news. No fever or chills noted her left leg has reopened at this point. 04-15-2022 upon evaluation patient's wounds actually are showing signs of improvement which is great news. Little by little I do believe her making progress here. In regard to the right leg this is looking better though the measurement does not speak to it there is a lot of new skin growth. On the left leg this is significantly smaller compared to last week most of the blistered area has completely closed. 04-22-2022 upon evaluation today  patient appears to be doing excellent in regard to her wounds both are showing signs of improvement. Her knees and above are still extremely swollen the legs were rewrapped them are down significantly. With that being said I do not think that we will get a be able to keep them that way once were not rapid noted that she really has no way to be able to put juxta lites on and off and nobody to help her with that. That is the biggest concern that we have here at this point. Fortunately I do not think that there is any evidence of infection which is great news. 04-29-2022 upon evaluation today patient appears to be doing well with regard to her wounds. She has been tolerating the dressing changes without complication. Fortunately there does not appear to be any evidence of infection locally or systemically which is great news and overall I am extremely pleased in that regard. Left leg is healed the right leg is very close we discussed today the possibility of her granddaughter helping with putting on and off compression wraps for her. 05-06-2022 upon evaluation today patient appears to be doing  well currently in regard to her left leg which is showing signs of being completely healed. With regard to her right leg she still has an open area though this is doing much better there is a lot less drainage than what we previously noted. Fortunately I see no evidence of active infection locally or systemically at this time which is great news. 05-20-2022 upon evaluation today patient appears to be doing excellent in regard to her leg. The left leg is still healed the right leg is much better. Fortunately I see no signs of infection locally or systemically at this time which is great news. No fevers, chills, nausea, vomiting, or diarrhea. 05-27-2022 upon evaluation today patient appears to be doing excellent in regard to her wound this is actually measuring smaller and looking much better. Fortunately I see no signs  of active infection locally or systemically at this time. 06-03-2022 upon evaluation patient's wound actually is measuring smaller and looking much better. She still has a lot of discomfort but fortunately nothing to significant at this point which is great news. No fevers, chills, nausea, vomiting, or diarrhea. I think we are making some pretty good [name], [name] ([id number]) progress here. 06-10-2022 upon evaluation today patient appears to be doing well currently in regard to her wound which is actually measuring somewhat smaller. This is actually 2 separate wounds that are clustered and this is making it appear little bit larger than what it is but nonetheless we are seeing some definite improvement. Fortunately I do not see any signs of active infection at this time. No fevers, chills, nausea, vomiting, or diarrhea. Electronic Signature(s) Signed: 06/10/2022 4:01:41 PM By: Worthy Keeler PA-C Entered By: Worthy Keeler on 06/10/2022 16:01:41 [name], [name] ([id number]) -------------------------------------------------------------------------------- Physical Exam Details Patient Name: [name] Date of Service: 06/10/2022 3:30 PM Medical Record Number: [id number] Patient Account Number: [id number] Date of Birth/Sex: 1945-11-07 (76 y.o. F) Treating RN: Carlene Coria Primary Care Provider: [name] Other Clinician: Referring Provider: [name] Treating Provider/Extender: Skipper Cliche in Treatment: 72 Constitutional Obese and well-hydrated in no acute distress. Respiratory normal breathing without difficulty. Psychiatric this patient is able to make decisions and demonstrates good insight into disease process. Alert and Oriented x 3. pleasant and cooperative. Notes Upon inspection patient's of active infection at this time which is great news. Wound currently actually showed signs of good granulation and epithelization at this point. Fortunately I see no signs no  fevers, chills, nausea, vomiting, or diarrhea. Electronic Signature(s) Signed: 06/10/2022 4:02:02 PM By: Worthy Keeler PA-C Entered By: Worthy Keeler on 06/10/2022 16:02:02 [name] ([id number]) -------------------------------------------------------------------------------- Problem List Details Patient Name: [name] Date of Service: 06/10/2022 3:30 PM Medical Record Number: [id number] Patient Account Number: [id number] Date of Birth/Sex: 03/08/1946 (76 y.o. F) Treating RN: Carlene Coria Primary Care Provider: [name] Other Clinician: Referring Provider: [name] Treating Provider/Extender: Skipper Cliche in Treatment: 72 Active Problems ICD-10 Encounter Code Description Active Date MDM Diagnosis I89.0 Lymphedema, not elsewhere classified 01/16/2021 No Yes I87.332 Chronic venous hypertension (idiopathic) with ulcer and inflammation of 01/16/2021 No Yes left lower extremity L97.812 Non-pressure chronic ulcer of other part of right lower leg with fat layer 01/07/2022 No Yes exposed Inactive Problems ICD-10 Code Description Active Date Inactive Date L97.822 Non-pressure chronic ulcer of other part of left lower leg with fat layer exposed 01/16/2021 01/16/2021 Resolved Problems Electronic Signature(s) Signed: 06/10/2022 3:43:57 PM By: Worthy Keeler PA-C Entered By: Worthy Keeler on 06/10/2022 15:43:57  [name], [name] ([id number]) -------------------------------------------------------------------------------- Progress Note Details Patient Name: [name], [name] Date of Service: 06/10/2022 3:30 PM Medical Record Number: [id number] Patient Account Number: [id number] Date of Birth/Sex: 08-26-1946 (76 y.o. F) Treating RN: Carlene Coria Primary Care Provider: [name] Other Clinician: Referring Provider: [name] Treating Provider/Extender: Skipper Cliche in Treatment: 72 Subjective Chief Complaint Information obtained from Patient Bilateral  LE Ulcers History of Present Illness (HPI) 03/15/2020 upon evaluation today patient presents today for initial evaluation here in our clinic concerning a wound that is on the left posterior lower extremity. Unfortunately this has been giving the patient some discomfort at this point which she notes has been affected her pretty much daily. The patient does have a history of venous insufficiency, hypertension, congestive heart failure, and a history of breast cancer. Fortunately there does not appear to be evidence of active infection at this time which is great news. No fevers, chills, nausea, vomiting, or diarrhea. Wound is not extremely large but does have some slough covering the surface of the wound. This is going require sharp debridement today. 03/15/2020 upon evaluation today patient appears to be doing fairly well in regard to her wound. She does have some slough noted on the surface of the wound currently but I do believe the Iodoflex has been beneficial over the past week. There is no signs of active infection at this time which is great news and overall very pleased with the progress. No fevers, chills, nausea, vomiting, or diarrhea. 04/02/2020 upon evaluation today patient appears to be doing a little better in regard to her wound size wise this is not tremendously smaller she does have some slough buildup on the surface of the wound. With that being said this is going require some sharp debridement today to clear away some of the necrotic debris. Fortunately there is no evidence of active infection at this time. No fevers, chills, nausea, vomiting, or diarrhea. 04/10/2020 on evaluation today patient appears to be doing well with regard to her ulcer which is measuring somewhat smaller today. She actually has more granulation tissue noted which is also good news is no need for sharp debridement today. Fortunately there is no evidence of infection either which is also excellent. No fevers, chills,  nausea, vomiting, or diarrhea. 04/26/2020 on evaluation today patient's wound actually is showing signs of good improvement which is great news. There does not appear to be any evidence of active infection. I do believe she is tolerating the collagen at this point. 05/03/2020 upon evaluation today patient's wound actually showed signs of good granulation at this time there does not appear to be any evidence of active infection which is great news and overall very pleased with where things stand. With that being said I do believe that the patient is can require some sharp debridement today but fortunately nothing too significant. 05/11/20 on evaluation today patient appears to be doing well in regard to her leg ulcer. She's been tolerating the dressing changes without complication. Fortunately there is no signs of active infection at this time. Overall I'm very pleased with where things stand. 05/18/2020 upon evaluation today patient's wound is showing signs of improvement is very dry and I think the collagen for the most part just dried to the wound bed. I am going to clean this away with sharp debridement in order to get this under control in my opinion. 05/25/2020 upon evaluation today patient actually appears to be doing well in regard to her leg ulcer. In fact upon  inspection it appears she could potentially be completely healed but that is not guaranteed based on what we are seeing. There does not appear to be any signs of active infection which is great news. 05/31/2020 on evaluation today patient appears to be doing well with regard to her wounds. She in fact appears to be almost completely healed on the left leg there is just a very small opening remaining point 1 06/08/2020 upon evaluation today patient appears to be doing excellent in regard to her leg ulcer on the left in fact it appears to be that she is completely healed as of today. I see no signs of active infection at this time which is great  news. No fevers, chills, nausea, vomiting, or diarrhea. READMISSION 01/16/2021 Patient that we have had in this clinic with a wound on the left posterior calf in the summer 2021 extending into October. She was felt to have chronic venous insufficiency. Per the patient's description she was discharged with what sounds like external compression stockings although she could not afford the "$75 per leg". She therefore did not get anything. Apparently her wound that she has currently is been in existence since January. She has been followed at vein and vascular with Unna boots and I think calcium alginate. She had ABIs done on 06/25/2020 that showed an noncompressible ABI on the right leg and the left leg but triphasic waveforms with good great toe pressures and a TBI of 0.90 on the right and 0.91 on the left Surprisingly the patient also had venous reflux studies that were really unremarkable. This included no evidence of DVTs or SVTs but there was no evidence of deep venous insufficiency or superficial venous insufficiency in the greater saphenous or short saphenous veins bilaterally. I would wonder about more central venous issues or perhaps this is all lymphedema 01/25/2021 upon evaluation today patient appears to be doing decently well and guard to her wound. The good news is the Iodoflex that is job she saw Dr. Dellia Nims last week for readmission and to be honest I think that this has done extremely well over that week. I do believe that she can tolerate the sharp debridement today which will be good. I think that cleaning off the wound will likely allow [place] to be able to use a different type of dressing I do not think the Iodoflex will even be necessary based on how clean the wound looks. Fortunately there is no sign of active infection at this time which is great news. No fevers, chills, nausea, vomiting, or diarrhea. ALYXANDRA, TENBRINK ([id number]) 02/12/2021 upon evaluation today patient appears to be  doing well with regard to her leg ulcer. We did switch to alginate when she came for nurse visit I think is doing much better for her. Fortunately there does not appear to be any signs of active infection at this time which is great news. No fevers, chills, nausea, vomiting, or diarrhea. 02/18/2021 upon evaluation today patient's wound is actually showing signs of good improvement. I am very pleased with where things stand currently. I do not see any signs of active infection which is great news and overall I feel like she is making good progress. The patient likewise is happy that things are doing so well and that this is measuring somewhat smaller. 02/25/2021 upon evaluation today patient actually appears to be doing quite well in regard to her wounds. She has been tolerating the dressing changes without complication. Fortunately there does not appear to be any  signs of active infection which is great news and overall I am extremely pleased with where things stand. No fevers, chills, nausea, vomiting, or diarrhea. She does have a lot of edema I really do think she needs compression socks to be worn in order to prevent things from causing additional issues for her currently. Especially on the right leg she should be wearing compression socks all the time to be honest. 03/04/2021 upon evaluation today patient's leg ulcer actually appears to be doing pretty well which is great news. There does not appear to be any significant change but overall the appearance is better even though the size is not necessarily reflecting a great improvement. Fortunately there does not appear to be any signs of active infection. No fevers, chills, nausea, vomiting, or diarrhea. 03/12/2021 upon evaluation today patient appears to be doing well with regard to her wounds. She has been tolerating the dressing changes without complication. The good news is that the wound on her left lateral leg is doing much better and is showing  signs of good improvement. Overall her swelling is controlled well as well. She does need some compression socks she plans to go Jefferey socks next week. 03/18/2021 on evaluation today patient's wound actually is showing signs of good improvement. She does have a little bit of slough this can require some sharp debridement today. 03/25/2021 upon evaluation today patient appears to be doing well with regard to her wound on the left lateral leg. She has been tolerating the dressing changes without complication. Fortunately there does not appear to be any signs of active infection at this time. No fever chills no 04/01/2021 upon evaluation today patient's wound is showing signs of improvement and overall very pleased with where things stand at this point. There is no evidence of active infection which is great news as well and in general I think that she is making great progress. 04/09/2021 upon evaluation today patient appears to be doing well with regard to her ankle ulcer. She is making good progress currently which is great news. There does not appear to be any signs of infection also excellent news. In general I am extremely pleased with where things stand today. No fevers, chills, nausea, vomiting, or diarrhea. 04/23/2021 upon inspection today patient appears to be doing quite well with regard to her leg ulcer. She has been tolerating the dressing changes without complication. She is can require some sharp debridement today but overall seems to be doing excellent. 05/06/2021 upon evaluation today patient unfortunately appears to be doing poorly overall in regard to her swelling. She is actually significantly more swollen than last week despite the fact that her wound is doing better this is not a good trend. I think that she probably needs to see her cardiologist ASAP and I discussed that with her today. This includes the fact that honestly I think she probably is getting need an increase in  her diuretic or something to try to clear away some of the excess fluid that she is experiencing at the moment. She is not been doing her pumps even twice a day but again right now more concerned that if she were to do the pump she would fluid overload her lungs causing other issues as far as her breathing is concerned. Obviously I think she needs to contact them and move up the appointment for the 12th of something sooner 2 weeks is a bit too long to wait with how swollen she has. 05/14/2021 upon evaluation today  patient's wound actually showing signs of being a little bit smaller compared to previous. She has been making good progress however. Fortunately there does not appear to be any evidence of active infection which is great. Overall I am extremely pleased in that regard. Nonetheless I am still concerned that she is having quite a bit of issue as far as the swelling is concerned she has been placed on fluid restriction as well as an increase in the furosemide by her doctor. We will see how things progress. 05/21/2021 upon evaluation today patient appears to be doing well with regard to her wound in general. She has been tolerating the dressing changes without complication. With that being said she has been using silver cell for a while and while this was showing signs of improvement it is now gotten to the point where this has slowed down quite significantly. I do believe that switching to a different dressing may be beneficial for her today. 05/28/2021 upon evaluation today patient actually appears to be doing quite well in regard to her wound. In fact there is really not any need for sharp debridement today based on what I see. The Hydrofera Blue is doing great and overall I think that she is managing quite nicely with the compression wrapping. Her edema is still down quite a bit with the wrapping in place. 06/11/2021 upon evaluation today patient appears to be doing well with regard to her leg  ulcer. She has been tolerating the dressing changes without complication. Fortunately there does not appear to be any signs of active infection at this time which is great news. No fevers, chills, nausea, vomiting, or diarrhea. She is going require some sharp debridement today. 10/11; left leg ulcer much better looking and with improved surface area. She is using Hydrofera Blue under 3 layer compression. She does not have any stocking on the right leg which in itself has very significant nonpitting edema 06/25/2021 upon evaluation today patient appears to be doing well with regard to the wound on her left leg. She has been tolerating the dressing changes without complication and overall I am extremely pleased with where things stand today. I do not see any evidence of infection which is great news. 07/02/2021 upon evaluation today patient's wound actually showing signs of excellent improvement and actually very pleased with where we stand today and the overall appearance. Fortunately there does not appear to be any signs of active infection. No fevers, chills, nausea, vomiting, or diarrhea. 07/09/2021 upon evaluation today patient appears to be doing better in regard to her wound this is measuring smaller and she is headed in the appropriate direction based on what I am seeing currently. I do not see any evidence of active infection systemically which is great news. 07/16/2021 upon evaluation today patient appears to be doing pretty well currently in regard to her leg ulcer. This is measuring smaller and looking much better this is great news. Fortunately there does not appear to be any evidence of active infection systemically which is great news SARIYAH, CORCINO ([id number]) as well. 07/23/2021 upon evaluation today patient's wound is actually showing signs of good improvement this is definitely measuring smaller. I do not see any signs of infection which is great news and overall very pleased in  that regard. Overall I think that she is doing well with the [facility]. 07/30/2020 upon evaluation today patient appears to be doing well in regard to her wound. She has been tolerating the dressing changes without complication  think the Hydrofera Blue at this point is actually causing her to dry out a lot as far as the wound bed is concerned. Subsequently I think that we need to try to see what we can do to improve this. Fortunately there does not appear to be any evidence of active infection at this time which is great news. 08/06/2021 upon evaluation today patient appears to be doing well with regards to her wound. Is not measuring significantly smaller initial inspection but upon closer inspection she has a lot of new skin growth across the central portion of the wound. I think will get very close to complete resolution. 08/13/2021 upon evaluation today patient appears to be doing well with regard to her wound. In fact this appears to be I believe healed although there is still some question as to whether it is completely so. I am concerned about the fact that to be honest she still has some evidence of dry skin that could be just trapping something underneath I still want to debride anything as I am afraid of causing some damage to the good skin for that reason I Georgina Peer probably monitor for 1 more week before completely closing everything out. 08/20/2021 upon evaluation today patient actually appears to be doing excellent. In fact she appears to be completely healed. This was the case last week as well we just wanted to make sure nothing reopened since she does not really have an ability to put on compression socks this is good to be something we need to make sure was well-healed. Nonetheless I think we have achieved that goal as of today. 12/27; this is a patient we discharged 2 weeks ago with wounds on her left lower leg laterally. She was supposed to get stockings and really did not get  them. She developed blistering and reopening probably sometime late last week. She has 3 areas in the same area as previously. The mid calf dimensions on the left went from 39 to 51 cm today. She also has very significant edema on the right leg but as far as I am aware has not had wounds in this area 1/4; patient presents for follow-up. She has tolerated the compression wrap well. She has no issues or complaints today. 09/17/2021 upon evaluation today patient appears to be doing well with regard to her wounds. She has been tolerating the dressing changes without complication. Fortunately there does not appear to be any signs of active infection at this time. No fevers, chills, nausea, vomiting, or diarrhea. 10/01/2021 upon evaluation today patient appears to be doing well with regard to her wound this is actually measuring better and looking smaller and I am very pleased in that regard. Fortunately I do not see any evidence of active infection locally nor systemically at this point. No fevers, chills, nausea, vomiting, or diarrhea. 10/08/2021 upon evaluation patient's wounds are actually showing some signs of improvement measuring a little bit smaller and looking a little bit better. Overall I do not think there is any need for sharp debridement today which is good news. No fevers, chills, nausea, vomiting, or diarrhea. 10/14/2021 upon evaluation today patient appears to be doing well with regard to her wound. She has been tolerating the dressing changes without complication and overall I am extremely pleased with where things stand today. There is a little bit of film on the surface of the wounds but this was carefully cleaned away with just saline and gauze she really did not want me to do  any sharp debridement today. 10/29/2021 upon evaluation today patient appears to be doing quite well in regard to her wounds. She is actually showing signs of excellent improvement which is great news and overall I  feel like we are on the right track at this time. She does have a wound on both legs and she does need something compression wise when she heals for that reason we will get a go and see about ordering her bilateral juxta lite compression wraps. 11/05/2021 upon evaluation today patient appears to be doing well with regard to her wound. She has been tolerating the dressing changes without complication. Fortunately I do not see any evidence of active infection locally nor systemically at this time which is great news. No fevers, chills, nausea, vomiting, or diarrhea. 11/12/2021 upon evaluation today patient appears to be doing well with regard to her wounds. She is doing excellent on the left on the right this is not doing quite as well there is some slough and biofilm buildup. 3/14; patient presents for follow-up. She has no issues or complaints today. She has tolerated the compression wraps well. 11/26/2021 upon evaluation today patient appears to be doing well with regard to her wounds. Both are showing signs of good improvement which is great news. I do not see any evidence of active infection and overall think she is healing quite nicely. 12/03/2021 upon evaluation today patient appears to be doing well with regard to her wounds. Both are showing signs of excellent improvement I am very pleased with where things stand and I think that she is making good progress here. Fortunately I do not see any signs of active infection locally or systemically which is great news. No fevers, chills, nausea, vomiting, or diarrhea. 12-10-2021 upon evaluation today patient's wounds actually appear to be doing awesome. I am extremely pleased with where we stand I think she is making excellent progress. I do not see any signs of active infection locally or systemically at this time. 12-17-2021 upon evaluation today patient's wounds are actually showing signs of good improvement. Fortunately I do not see any evidence  of active infection locally nor systemically which is great news. Overall I do not believe that she is showing any signs of significant worsening which is good news. With that being said I do believe that she has a little bit of film on the surface of the wound I was actually able to clear this away with saline and gauze no sharp debridement was necessary. 12-24-2021 upon evaluation today patient appears to be doing well with regard to her wounds. I do not see any signs of active infection currently which is great news and overall I think that we are on the right track here. No fevers, chills, nausea, vomiting, or diarrhea. 12-31-2021 upon evaluation today patient appears to be doing well with regard to her wounds. Both are showing signs of improvement the wound [name], [name] ([id number]) on the right leg is going require some sharp debridement on the left seems to be doing quite well. 01-07-2022 upon evaluation today patient appears to be doing well currently in regard to her wounds. She is actually making good progress bilaterally. Both of them require little bit of sharp debridement but not too much which is great news. Upon inspection patient's wound bed actually showed signs of good granulation and epithelization on both legs. She still is having significant issues with the right leg compared to the left although I do feel like both are actually showing  signs of pretty good improvement. 01-21-2022 upon evaluation today patient's wound is actually showing signs of excellent improvement I am very pleased with where we stand currently. I do not see any signs of active infection locally or systemically which is great news. No fevers, chills, nausea, vomiting, or diarrhea. 01-28-2022 upon evaluation today patient appears to be doing well with regard to her wound on the left leg this is pretty much just about healed which is great news. Unfortunately in regard to the wound on her right leg this is not  doing nearly as well in fact I think we probably need to see about a culture today that was discussed with her. 02-04-2022 upon evaluation today patient appears to be doing well with regard to her left leg which in fact might actually be completely healed I am not 100% sure. Nonetheless this is shown signs of excellent improvement which is great news. With that being said in regards to the right leg there is some slough and film buildup on the surface of the wound although she is feeling much better than last week this does appear to be doing much better than it was last week. Fortunately there does not appear to be any signs of active infection locally or systemically at this time. 02-11-2022 upon evaluation today patient's wounds appear to be doing decently well. In fact the left leg is healed although there is brand-new skin were definitely given still need to wrap her for the time being. On the right leg this is showing signs of some epithelial growth in the middle of the wound it still measures the same because there is still a larger area with speckled openings but again as far as the entire area being open this is significantly improved which is great news. I am very pleased. 6/13; left leg remains healed and she has a juxta light to apply to it today. There is some concern about whether she is going to be able to do this at home. She lives with a daughter although she has her own disability. On the right the wound looks smaller to me nice epithelialization good edema control. 02-25-2022 upon evaluation today patient appears to be doing well with regard to her legs the left leg is still healed the right leg is doing much better. I think we are on the right track here. With that being said unfortunately she continues to have significant issues with some other problems and she actually tells me on Saturday she was having lunch and went inside to sit down when she tells me she had all of a sudden  a sharp sensation that went from her hand through her arm into her leg and her neck and she tells me that she could not see anything for the next hour it was completely black. Slowly after this her vision started to return and has been normal since. It sounds to me as if she may have had a TIA or mini stroke at this point. Nonetheless I think that she needs to have this checked out ASAP. Her primary care provider is Dr. Clide Deutscher at [facility]. We will get a try to get in touch with him. 03-10-2022 upon evaluation today patient's wound actually showed signs of good improvement. She seems to be making excellent progress here. I do not see any evidence of active infection locally or systemically which is great news. No fevers, chills, nausea, vomiting, or diarrhea. She was in the hospital since have seen her  last she ended up finding out that she was having syncopal episodes. That is what was going on with the blacking out and losing vision. Subsequently they adjusted some of her medication she seems to be doing better. 03-18-2022 upon evaluation today patient's wound is actually showing signs of good granulation and epithelization at this point. Fortunately I do not see any signs of active infection locally or systemically which is great news. 03-25-2022 any evidence of active infection locally or systemically which is great news. Any evidence of active infection which is great news and overall the wound is looking better. Upon evaluation today patient appears to be doing well with regard to her leg ulcer. She has been tolerating the dressing changes without complication. Fortunately I do not see 7/25; the patient's wound is on the right lateral lower extremity using silver alginate 3 layer compression. She has chronic venous insufficiency and lymphedema. We are using a juxta lite on the left leg which does not have any open wounds but she is leaving this on all week it seems that there just is not a way  to get this changed at home 04-08-2022 upon evaluation today patient appears to be doing well currently in regard to her wound. She has been tolerating the dressing changes without complication. Fortunately I do not see any evidence of active infection locally or systemically which is great news. No fever or chills noted her left leg has reopened at this point. 04-15-2022 upon evaluation patient's wounds actually are showing signs of improvement which is great news. Little by little I do believe her making progress here. In regard to the right leg this is looking better though the measurement does not speak to it there is a lot of new skin growth. On the left leg this is significantly smaller compared to last week most of the blistered area has completely closed. 04-22-2022 upon evaluation today patient appears to be doing excellent in regard to her wounds both are showing signs of improvement. Her knees and above are still extremely swollen the legs were rewrapped them are down significantly. With that being said I do not think that we will get a be able to keep them that way once were not rapid noted that she really has no way to be able to put juxta lites on and off and nobody to help her with that. That is the biggest concern that we have here at this point. Fortunately I do not think that there is any evidence of infection which is great news. 04-29-2022 upon evaluation today patient appears to be doing well with regard to her wounds. She has been tolerating the dressing changes without complication. Fortunately there does not appear to be any evidence of infection locally or systemically which is great news and overall I am extremely pleased in that regard. Left leg is healed the right leg is very close we discussed today the possibility of her granddaughter helping with putting on and off compression wraps for her. 05-06-2022 upon evaluation today patient appears to be doing well currently in regard  to her left leg which is showing signs of being completely healed. With regard to her right leg she still has an open area though this is doing much better there is a lot less drainage than what we previously noted. Fortunately I see no evidence of active infection locally or systemically at this time which is great news. 05-20-2022 upon evaluation today patient appears to be doing excellent in regard to  her leg. The left leg is still healed the right leg is much better. Fortunately I see no signs of infection locally or systemically at this time which is great news. No fevers, chills, nausea, vomiting, or diarrhea. 05-27-2022 upon evaluation today patient appears to be doing excellent in regard to her wound this is actually measuring smaller and looking [name], [name] ([id number]) much better. Fortunately I see no signs of active infection locally or systemically at this time. 06-03-2022 upon evaluation patient's wound actually is measuring smaller and looking much better. She still has a lot of discomfort but fortunately nothing to significant at this point which is great news. No fevers, chills, nausea, vomiting, or diarrhea. I think we are making some pretty good progress here. 06-10-2022 upon evaluation today patient appears to be doing well currently in regard to her wound which is actually measuring somewhat smaller. This is actually 2 separate wounds that are clustered and this is making it appear little bit larger than what it is but nonetheless we are seeing some definite improvement. Fortunately I do not see any signs of active infection at this time. No fevers, chills, nausea, vomiting, or diarrhea. Objective Constitutional Obese and well-hydrated in no acute distress. Vitals Time Taken: 3:43 PM, Height: 69 in, Weight: 244 lbs, BMI: 36, Temperature: 98.8 F, Pulse: 69 bpm, Respiratory Rate: 18 breaths/min, Blood Pressure: 164/75 mmHg. Respiratory normal breathing without  difficulty. Psychiatric this patient is able to make decisions and demonstrates good insight into disease process. Alert and Oriented x 3. pleasant and cooperative. General Notes: Upon inspection patient's of active infection at this time which is great news. Wound currently actually showed signs of good granulation and epithelization at this point. Fortunately I see no signs no fevers, chills, nausea, vomiting, or diarrhea. Integumentary (Hair, Skin) Wound #4 status is Open. Original cause of wound was Gradually Appeared. The date acquired was: 10/27/2021. The wound has been in treatment 32 weeks. The wound is located on the Right,Lateral Lower Leg. The wound measures 5cm length x 2cm width x 0.1cm depth; 7.854cm^2 area and 0.785cm^3 volume. There is Fat Layer (Subcutaneous Tissue) exposed. There is no tunneling or undermining noted. There is a medium amount of serosanguineous drainage noted. There is medium (34-66%) pink granulation within the wound bed. There is a medium (34-66%) amount of necrotic tissue within the wound bed including Adherent Slough. 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 I am good recommend that we have the patient continue to monitor for any signs of worsening or infection. If anything changes she knows to contact the office and let me know but right now we will continue with 3 layer compression wraps bilaterally. 2. I am also can recommend that we have the patient continue to monitor for any signs of infection obviously this would include fevers, chills, nausea, vomiting, or diarrhea. Hopefully now that will occur however. 3. She does need to try to elevate her legs much as possible to help with edema control under the wrap or using silver alginate and ABD  pads but again still elevation can aid in keeping this from continuing to breakdown as bad. We will see patient back for reevaluation in 1 week here in the clinic. If anything worsens or changes patient will contact our office for additional recommendations. [name], [name] ([id number]) Electronic Signature(s) Signed: 06/10/2022  4:02:53 PM By: Worthy Keeler PA-C Entered By: Worthy Keeler on 06/10/2022 16:02:53 [name] ([id number]) -------------------------------------------------------------------------------- SuperBill Details Patient Name: [name] Date of Service: 06/10/2022 Medical Record Number: [id number] Patient Account Number: [id number] Date of Birth/Sex: 08-18-1946 (76 y.o. F) Treating RN: Carlene Coria Primary Care Provider: [name] Other Clinician: Referring Provider: [name] Treating Provider/Extender: Skipper Cliche in Treatment: 72 Diagnosis Coding ICD-10 Codes Code Description I89.0 Lymphedema, not elsewhere classified I87.332 Chronic venous hypertension (idiopathic) with ulcer and inflammation of left lower extremity L97.812 Non-pressure chronic ulcer of other part of right lower leg with fat layer exposed Physician Procedures CPT4 Code Description: [id number] - WC PHYS LEVEL 3 - EST PT Modifier: Quantity: 1 CPT4 Code Description: ICD-10 Diagnosis Description I89.0 Lymphedema, not elsewhere classified I87.332 Chronic venous hypertension (idiopathic) with ulcer and inflammation of L97.812 Non-pressure chronic ulcer of other part of right lower leg with fat  lay Modifier: left lower extremity er exposed Quantity: Electronic Signature(s) Signed: 06/10/2022 4:03:20 PM By: Worthy Keeler PA-C Entered By: Worthy Keeler on 06/10/2022 16:03:20

## 2022-06-11 NOTE — Progress Notes (Signed)
[name], [name] ([id number]) Visit Report for 06/10/2022 Arrival Information Details Patient Name: [name], [name] Date of Service: 06/10/2022 3:30 PM Medical Record Number: [id number] Patient Account Number: [id number] Date of Birth/Sex: [birth year] (76 y.o. F) Treating RN: Carlene Coria Primary Care Karalyne Nusser: [name] Other Clinician: Referring Kortland Nichols: [name] Treating Iyahna Obriant/Extender: Skipper Cliche in Treatment: 72 Visit Information History Since Last Visit All ordered tests and consults were completed: No Patient Arrived: Wheel Chair Added or deleted any medications: No Arrival Time: 15:42 Any new allergies or adverse reactions: No Accompanied By: self Had a fall or experienced change in No Transfer Assistance: None activities of daily living that may affect Patient Identification Verified: Yes risk of falls: Secondary Verification Process Completed: Yes Signs or symptoms of abuse/neglect since last visito No Patient Requires Transmission-Based Precautions: No Hospitalized since last visit: No Patient Has Alerts: Yes Implantable device outside of the clinic excluding No Patient Alerts: NOT DIABETIC cellular tissue based products placed in the center since last visit: Has Dressing in Place as Prescribed: Yes Has Compression in Place as Prescribed: Yes Pain Present Now: No Electronic Signature(s) Signed: 06/11/2022 11:46:40 AM By: Carlene Coria RN Entered By: Carlene Coria on 06/10/2022 15:43:15 [name] ([id number]) -------------------------------------------------------------------------------- Clinic Level of Care Assessment Details Patient Name: [name] Date of Service: 06/10/2022 3:30 PM Medical Record Number: [id number] Patient Account Number: [id number] Date of Birth/Sex: Oct 23, 1945 (76 y.o. F) Treating RN: Carlene Coria Primary Care Ashlynd Michna: [name] Other Clinician: Referring Alize Acy: [name] Treating Aariyana Manz/Extender:  Skipper Cliche in Treatment: 72 Clinic Level of Care Assessment Items TOOL 1 Quantity Score '[]'$  - Use when EandM and Procedure is performed on INITIAL visit 0 ASSESSMENTS - Nursing Assessment / Reassessment '[]'$  - General Physical Exam (combine w/ comprehensive assessment (listed just below) when performed on new 0 pt. evals) '[]'$  - 0 Comprehensive Assessment (HX, ROS, Risk Assessments, Wounds Hx, etc.) ASSESSMENTS - Wound and Skin Assessment / Reassessment '[]'$  - Dermatologic / Skin Assessment (not related to wound area) 0 ASSESSMENTS - Ostomy and/or Continence Assessment and Care '[]'$  - Incontinence Assessment and Management 0 '[]'$  - 0 Ostomy Care Assessment and Management (repouching, etc.) PROCESS - Coordination of Care '[]'$  - Simple Patient / Family Education for ongoing care 0 '[]'$  - 0 Complex (extensive) Patient / Family Education for ongoing care '[]'$  - 0 Staff obtains Programmer, systems, Records, Test Results / Process Orders '[]'$  - 0 Staff telephones HHA, Nursing Homes / Clarify orders / etc '[]'$  - 0 Routine Transfer to another Facility (non-emergent condition) '[]'$  - 0 Routine Hospital Admission (non-emergent condition) '[]'$  - 0 New Admissions / Biomedical engineer / Ordering NPWT, Apligraf, etc. '[]'$  - 0 Emergency Hospital Admission (emergent condition) PROCESS - Special Needs '[]'$  - Pediatric / Minor Patient Management 0 '[]'$  - 0 Isolation Patient Management '[]'$  - 0 Hearing / Language / Visual special needs '[]'$  - 0 Assessment of Community assistance (transportation, D/C planning, etc.) '[]'$  - 0 Additional assistance / Altered mentation '[]'$  - 0 Support Surface(s) Assessment (bed, cushion, seat, etc.) INTERVENTIONS - Miscellaneous '[]'$  - External ear exam 0 '[]'$  - 0 Patient Transfer (multiple staff / Civil Service fast streamer / Similar devices) '[]'$  - 0 Simple Staple / Suture removal (25 or less) '[]'$  - 0 Complex Staple / Suture removal (26 or more) '[]'$  - 0 Hypo/Hyperglycemic Management (do not check if billed  separately) '[]'$  - 0 Ankle / Brachial Index (ABI) - do not check if billed separately Has the patient been seen at the hospital within the  last three years: Yes Total Score: 0 Level Of Care: ____ [name] ([id number]) Electronic Signature(s) Signed: 06/11/2022 11:46:40 AM By: Carlene Coria RN Entered By: Carlene Coria on 06/11/2022 10:42:48 [name] ([id number]) -------------------------------------------------------------------------------- Compression Therapy Details Patient Name: [name] Date of Service: 06/10/2022 3:30 PM Medical Record Number: [id number] Patient Account Number: [id number] Date of Birth/Sex: 1946-02-09 (76 y.o. F) Treating RN: Carlene Coria Primary Care Octavia Velador: [name] Other Clinician: Referring Arjun Hard: [name] Treating Darletta Noblett/Extender: Skipper Cliche in Treatment: 72 Compression Therapy Performed for Wound Assessment: Wound #4 Right,Lateral Lower Leg Performed By: Clinician Carlene Coria, RN Compression Type: Three Layer Post Procedure Diagnosis Same as Pre-procedure Electronic Signature(s) Signed: 06/11/2022 10:42:08 AM By: Carlene Coria RN Entered By: Carlene Coria on 06/11/2022 10:42:07 [name] ([id number]) -------------------------------------------------------------------------------- Compression Therapy Details Patient Name: [name] Date of Service: 06/10/2022 3:30 PM Medical Record Number: [id number] Patient Account Number: [id number] Date of Birth/Sex: 18-Jan-1946 (76 y.o. F) Treating RN: Carlene Coria Primary Care Ashland Wiseman: [name] Other Clinician: Referring Dhrithi Riche: [name] Treating Syed Zukas/Extender: Skipper Cliche in Treatment: 72 Compression Therapy Performed for Wound Assessment: NonWound Condition Lymphedema - Left Leg Performed By: Clinician Carlene Coria, RN Compression Type: Three Layer Post Procedure Diagnosis Same as Pre-procedure Electronic Signature(s) Signed:  06/11/2022 10:42:23 AM By: Carlene Coria RN Entered By: Carlene Coria on 06/11/2022 10:42:23 [name] ([id number]) -------------------------------------------------------------------------------- Encounter Discharge Information Details Patient Name: [name] Date of Service: 06/10/2022 3:30 PM Medical Record Number: [id number] Patient Account Number: [id number] Date of Birth/Sex: 01/09/1946 (76 y.o. F) Treating RN: Carlene Coria Primary Care Naiyana Barbian: [name] Other Clinician: Referring Yousef Huge: [name] Treating Theodoro Koval/Extender: Skipper Cliche in Treatment: 72 Encounter Discharge Information Items Discharge Condition: Stable Ambulatory Status: Wheelchair Discharge Destination: Home Transportation: Private Auto Accompanied By: self Schedule Follow-up Appointment: Yes Clinical Summary of Care: Electronic Signature(s) Signed: 06/11/2022 10:45:03 AM By: Carlene Coria RN Entered By: Carlene Coria on 06/11/2022 10:45:03 [name] ([id number]) -------------------------------------------------------------------------------- Lower Extremity Assessment Details Patient Name: [name] Date of Service: 06/10/2022 3:30 PM Medical Record Number: [id number] Patient Account Number: [id number] Date of Birth/Sex: Apr 23, 1946 (76 y.o. F) Treating RN: Carlene Coria Primary Care Dorrian Doggett: [name] Other Clinician: Referring Klarisa Barman: [name] Treating Enora Trillo/Extender: Skipper Cliche in Treatment: 72 Vascular Assessment Pulses: Dorsalis Pedis Palpable: [Right:Yes] Electronic Signature(s) Signed: 06/11/2022 10:41:03 AM By: Carlene Coria RN Entered By: Carlene Coria on 06/11/2022 10:41:03 [name] ([id number]) -------------------------------------------------------------------------------- Multi Wound Chart Details Patient Name: [name] Date of Service: 06/10/2022 3:30 PM Medical Record Number: [id number] Patient Account Number:  [id number] Date of Birth/Sex: [birth year] (76 y.o. F) Treating RN: Carlene Coria Primary Care Lizzette Carbonell: [name] Other Clinician: Referring Brenya Taulbee: [name] Treating Leeandra Ellerson/Extender: Skipper Cliche in Treatment: 72 Vital Signs Height(in): 69 Pulse(bpm): 33 Weight(lbs): 244 Blood Pressure(mmHg): 164/75 Body Mass Index(BMI): 36 Temperature(F): 98.8 Respiratory Rate(breaths/min): 18 Photos: [4:No Photos] [N/A:N/A] Wound Location: [4:Right, Lateral Lower Leg] [N/A:N/A] Wounding Event: [4:Gradually Appeared] [N/A:N/A] Primary Etiology: [4:Venous Leg Ulcer] [N/A:N/A] Comorbid History: [4:Cataracts, Lymphedema, Sleep Apnea, Congestive Heart Failure, Hypertension, Osteoarthritis, Neuropathy, Received Chemotherapy, Received Radiation] [N/A:N/A] Date Acquired: [4:10/27/2021] [N/A:N/A] Weeks of Treatment: [4:32] [N/A:N/A] Wound Status: [4:Open] [N/A:N/A] Wound Recurrence: [4:No] [N/A:N/A] Measurements L x W x D (cm) [4:5x2x0.1] [N/A:N/A] Area (cm) : [4:7.854] [N/A:N/A] Volume (cm) : [4:0.785] [N/A:N/A] % Reduction in Area: [4:-566.70%] [N/A:N/A] % Reduction in Volume: [4:-565.30%] [N/A:N/A] Classification: [4:Full Thickness Without Exposed Support Structures] [N/A:N/A] Exudate Amount: [4:Medium] [N/A:N/A] Exudate Type: [4:Serosanguineous] [N/A:N/A] Exudate Color: [4:red, brown] [N/A:N/A] Granulation Amount: [4:Medium (34-66%)] [  N/A:N/A] Granulation Quality: [4:Pink] [N/A:N/A] Necrotic Amount: [4:Medium (34-66%)] [N/A:N/A] Exposed Structures: [4:Fat Layer (Subcutaneous Tissue): Yes Fascia: No Tendon: No Muscle: No Joint: No Bone: No None] [N/A:N/A N/A] Treatment Notes Electronic Signature(s) Signed: 06/11/2022 10:41:51 AM By: Carlene Coria RN Entered By: Carlene Coria on 06/11/2022 10:41:51 [name] ([id number]) -------------------------------------------------------------------------------- Multi-Disciplinary Care Plan Details Patient Name: [name] Date  of Service: 06/10/2022 3:30 PM Medical Record Number: [id number] Patient Account Number: [id number] Date of Birth/Sex: April 08, 1946 (76 y.o. F) Treating RN: Carlene Coria Primary Care Tannis Burstein: [name] Other Clinician: Referring Oscar Hank: [name] Treating Franky Reier/Extender: Skipper Cliche in Treatment: 71 Active Inactive Electronic Signature(s) Signed: 06/11/2022 10:41:41 AM By: Carlene Coria RN Entered By: Carlene Coria on 06/11/2022 10:41:40 [name] ([id number]) -------------------------------------------------------------------------------- Pain Assessment Details Patient Name: [name] Date of Service: 06/10/2022 3:30 PM Medical Record Number: [id number] Patient Account Number: [id number] Date of Birth/Sex: Nov 17, 1945 (76 y.o. F) Treating RN: Carlene Coria Primary Care Antiono Ettinger: [name] Other Clinician: Referring Brenna Friesenhahn: [name] Treating Nicolaus Andel/Extender: Skipper Cliche in Treatment: 72 Active Problems Location of Pain Severity and Description of Pain Patient Has Paino No Site Locations Pain Management and Medication Current Pain Management: Electronic Signature(s) Signed: 06/11/2022 11:46:40 AM By: Carlene Coria RN Entered By: Carlene Coria on 06/10/2022 15:43:40 [name] ([id number]) -------------------------------------------------------------------------------- Patient/Caregiver Education Details Patient Name: [name] Date of Service: 06/10/2022 3:30 PM Medical Record Number: [id number] Patient Account Number: [id number] Date of Birth/Gender: 04/12/1946 (76 y.o. F) Treating RN: Carlene Coria Primary Care Physician: [name] Other Clinician: Referring Physician: [name] Treating Physician/Extender: Skipper Cliche in Treatment: 29 Education Assessment Education Provided To: Patient Education Topics Provided Wound/Skin Impairment: Methods: Explain/Verbal Responses: State content correctly Electronic  Signature(s) Signed: 06/11/2022 11:46:40 AM By: Carlene Coria RN Entered By: Carlene Coria on 06/11/2022 10:43:10 [name] ([id number]) -------------------------------------------------------------------------------- Wound Assessment Details Patient Name: [name] Date of Service: 06/10/2022 3:30 PM Medical Record Number: [id number] Patient Account Number: [id number] Date of Birth/Sex: [birth year] (76 y.o. F) Treating RN: Carlene Coria Primary Care Estrella Alcaraz: [name] Other Clinician: Referring Anahita Cua: [name] Treating Hayslee Casebolt/Extender: Skipper Cliche in Treatment: 72 Wound Status Wound Number: 4 Primary Venous Leg Ulcer Etiology: Wound Location: Right, Lateral Lower Leg Wound Open Wounding Event: Gradually Appeared Status: Date Acquired: 10/27/2021 Comorbid Cataracts, Lymphedema, Sleep Apnea, Congestive Heart Weeks Of Treatment: 32 History: Failure, Hypertension, Osteoarthritis, Neuropathy, Clustered Wound: No Received Chemotherapy, Received Radiation Wound Measurements Length: (cm) 5 Width: (cm) 2 Depth: (cm) 0.1 Area: (cm) 7.854 Volume: (cm) 0.785 % Reduction in Area: -566.7% % Reduction in Volume: -565.3% Epithelialization: None Tunneling: No Undermining: No Wound Description Classification: Full Thickness Without Exposed Support Structures Exudate Amount: Medium Exudate Type: Serosanguineous Exudate Color: red, brown Foul Odor After Cleansing: No Slough/Fibrino Yes Wound Bed Granulation Amount: Medium (34-66%) Exposed Structure Granulation Quality: Pink Fascia Exposed: No Necrotic Amount: Medium (34-66%) Fat Layer (Subcutaneous Tissue) Exposed: Yes Necrotic Quality: Adherent Slough Tendon Exposed: No Muscle Exposed: No Joint Exposed: No Bone Exposed: No Treatment Notes Wound #4 (Lower Leg) Wound Laterality: Right, Lateral Cleanser Soap and Water Discharge Instruction: Gently cleanse wound with antibacterial soap, rinse and pat  dry prior to dressing wounds Wound Cleanser Discharge Instruction: Wash your hands with soap and water. Remove old dressing, discard into plastic bag and place into trash. Cleanse the wound with Wound Cleanser prior to applying a clean dressing using gauze sponges, not tissues or cotton balls. Do not scrub or use excessive force. Pat dry using gauze sponges, not tissue  or cotton balls. Peri-Wound Care Topical Primary Dressing Silvercel Small 2x2 (in/in) Discharge Instruction: Apply Silvercel Small 2x2 (in/in) as instructed Secondary Dressing ABD Pad 5x9 (in/in) Discharge Instruction: Cover with ABD pad [name], [name] ([id number]) Secured With Compression Wrap 3-LAYER WRAP - Profore Lite LF 3 Multilayer Compression Bandaging System Discharge Instruction: Apply 3 multi-layer wrap as prescribed. Compression Stockings Circaid Juxta Lite Compression Wrap Quantity: 1 Right Leg Compression Amount: 20-30 mmHg Discharge Instruction: Apply Circaid Juxta Lite Compression Wrap as directed Add-Ons Electronic Signature(s) Signed: 06/11/2022 11:46:40 AM By: Carlene Coria RN Entered By: Carlene Coria on 06/10/2022 15:44:20 [name] ([id number]) -------------------------------------------------------------------------------- [place] Details Patient Name: [name] Date of Service: 06/10/2022 3:30 PM Medical Record Number: [id number] Patient Account Number: [id number] Date of Birth/Sex: [birth year] (76 y.o. F) Treating RN: Carlene Coria Primary Care Dodd Schmid: [name] Other Clinician: Referring Willie Plain: [name] Treating Deriana Vanderhoef/Extender: Skipper Cliche in Treatment: 72 Vital Signs Time Taken: 15:43 Temperature (F): 98.8 Height (in): 69 Pulse (bpm): 69 Weight (lbs): 244 Respiratory Rate (breaths/min): 18 Body Mass Index (BMI): 36 Blood Pressure (mmHg): 164/75 Reference Range: 80 - 120 mg / dl Electronic Signature(s) Signed: 06/11/2022 11:46:40 AM By: Carlene Coria RN Entered By: Carlene Coria on 06/10/2022 15:43:31

## 2022-06-12 ENCOUNTER — Encounter: Payer: Self-pay | Admitting: *Deleted

## 2022-06-12 ENCOUNTER — Emergency Department: Payer: Medicare Other

## 2022-06-12 ENCOUNTER — Other Ambulatory Visit: Payer: Self-pay

## 2022-06-12 ENCOUNTER — Inpatient Hospital Stay
Admission: EM | Admit: 2022-06-12 | Discharge: 2022-06-15 | DRG: 291 | Disposition: A | Discharge status: Home / Self Care | Payer: Medicare Other | Attending: Internal Medicine | Admitting: Internal Medicine

## 2022-06-12 DIAGNOSIS — D631 Anemia in chronic kidney disease: Secondary | ICD-10-CM | POA: Diagnosis present

## 2022-06-12 DIAGNOSIS — Z9221 Personal history of antineoplastic chemotherapy: Secondary | ICD-10-CM

## 2022-06-12 DIAGNOSIS — I2489 Other forms of acute ischemic heart disease: Secondary | ICD-10-CM | POA: Diagnosis present

## 2022-06-12 DIAGNOSIS — K219 Gastro-esophageal reflux disease without esophagitis: Secondary | ICD-10-CM | POA: Diagnosis present

## 2022-06-12 DIAGNOSIS — Z853 Personal history of malignant neoplasm of breast: Secondary | ICD-10-CM

## 2022-06-12 DIAGNOSIS — D638 Anemia in other chronic diseases classified elsewhere: Secondary | ICD-10-CM | POA: Diagnosis present

## 2022-06-12 DIAGNOSIS — Z923 Personal history of irradiation: Secondary | ICD-10-CM

## 2022-06-12 DIAGNOSIS — I130 Hypertensive heart and chronic kidney disease with heart failure and stage 1 through stage 4 chronic kidney disease, or unspecified chronic kidney disease: Secondary | ICD-10-CM | POA: Diagnosis not present

## 2022-06-12 DIAGNOSIS — Z170 Estrogen receptor positive status [ER+]: Secondary | ICD-10-CM

## 2022-06-12 DIAGNOSIS — Z8249 Family history of ischemic heart disease and other diseases of the circulatory system: Secondary | ICD-10-CM

## 2022-06-12 DIAGNOSIS — N179 Acute kidney failure, unspecified: Secondary | ICD-10-CM | POA: Diagnosis present

## 2022-06-12 DIAGNOSIS — Z87891 Personal history of nicotine dependence: Secondary | ICD-10-CM

## 2022-06-12 DIAGNOSIS — H9193 Unspecified hearing loss, bilateral: Secondary | ICD-10-CM | POA: Diagnosis present

## 2022-06-12 DIAGNOSIS — E669 Obesity, unspecified: Secondary | ICD-10-CM | POA: Diagnosis present

## 2022-06-12 DIAGNOSIS — C50919 Malignant neoplasm of unspecified site of unspecified female breast: Secondary | ICD-10-CM | POA: Diagnosis present

## 2022-06-12 DIAGNOSIS — R0789 Other chest pain: Secondary | ICD-10-CM | POA: Diagnosis present

## 2022-06-12 DIAGNOSIS — J9601 Acute respiratory failure with hypoxia: Secondary | ICD-10-CM | POA: Diagnosis present

## 2022-06-12 DIAGNOSIS — G4733 Obstructive sleep apnea (adult) (pediatric): Secondary | ICD-10-CM | POA: Diagnosis present

## 2022-06-12 DIAGNOSIS — Z803 Family history of malignant neoplasm of breast: Secondary | ICD-10-CM

## 2022-06-12 DIAGNOSIS — Z7982 Long term (current) use of aspirin: Secondary | ICD-10-CM

## 2022-06-12 DIAGNOSIS — E042 Nontoxic multinodular goiter: Secondary | ICD-10-CM | POA: Diagnosis present

## 2022-06-12 DIAGNOSIS — Z79811 Long term (current) use of aromatase inhibitors: Secondary | ICD-10-CM

## 2022-06-12 DIAGNOSIS — R079 Chest pain, unspecified: Secondary | ICD-10-CM | POA: Diagnosis present

## 2022-06-12 DIAGNOSIS — N1831 Chronic kidney disease, stage 3a: Secondary | ICD-10-CM | POA: Diagnosis present

## 2022-06-12 DIAGNOSIS — I5033 Acute on chronic diastolic (congestive) heart failure: Secondary | ICD-10-CM | POA: Diagnosis present

## 2022-06-12 DIAGNOSIS — R0902 Hypoxemia: Secondary | ICD-10-CM

## 2022-06-12 DIAGNOSIS — I5032 Chronic diastolic (congestive) heart failure: Secondary | ICD-10-CM | POA: Diagnosis present

## 2022-06-12 DIAGNOSIS — Z79899 Other long term (current) drug therapy: Secondary | ICD-10-CM

## 2022-06-12 DIAGNOSIS — I2721 Secondary pulmonary arterial hypertension: Secondary | ICD-10-CM | POA: Diagnosis present

## 2022-06-12 DIAGNOSIS — I161 Hypertensive emergency: Secondary | ICD-10-CM | POA: Diagnosis present

## 2022-06-12 DIAGNOSIS — G629 Polyneuropathy, unspecified: Secondary | ICD-10-CM | POA: Diagnosis present

## 2022-06-12 LAB — TROPONIN I (HIGH SENSITIVITY)
Troponin I (High Sensitivity): 19 ng/L — ABNORMAL HIGH
Troponin I (High Sensitivity): 25 ng/L — ABNORMAL HIGH

## 2022-06-12 LAB — CBC
HCT: 36.2 % (ref 36.0–46.0)
Hemoglobin: 11.2 g/dL — ABNORMAL LOW (ref 12.0–15.0)
MCH: 26.9 pg (ref 26.0–34.0)
MCHC: 30.9 g/dL (ref 30.0–36.0)
MCV: 87 fL (ref 80.0–100.0)
Platelets: 326 10*3/uL (ref 150–400)
RBC: 4.16 MIL/uL (ref 3.87–5.11)
RDW: 13.5 % (ref 11.5–15.5)
WBC: 8.3 10*3/uL (ref 4.0–10.5)
nRBC: 0 % (ref 0.0–0.2)

## 2022-06-12 LAB — BASIC METABOLIC PANEL WITH GFR
Anion gap: 12 (ref 5–15)
BUN: 31 mg/dL — ABNORMAL HIGH (ref 8–23)
CO2: 27 mmol/L (ref 22–32)
Calcium: 9.5 mg/dL (ref 8.9–10.3)
Chloride: 103 mmol/L (ref 98–111)
Creatinine, Ser: 1.05 mg/dL — ABNORMAL HIGH (ref 0.44–1.00)
GFR, Estimated: 55 mL/min — ABNORMAL LOW
Glucose, Bld: 148 mg/dL — ABNORMAL HIGH (ref 70–99)
Potassium: 3.4 mmol/L — ABNORMAL LOW (ref 3.5–5.1)
Sodium: 142 mmol/L (ref 135–145)

## 2022-06-12 NOTE — ED Provider Notes (Signed)
[facility] Provider Note    Event Date/Time   First MD Initiated Contact with Patient 06/12/22 2336     (approximate)   History   Chest Pain   HPI  [name] is a 76 y.o. female who presents to the ED for evaluation of Chest Pain   I reviewed medical DC summary from 6/30.  Obese patient with history of breast cancer, diastolic CHF, hypertension admitted for AKI and CHF.  Patient presents to the ED for evaluation of chest pain has been persistent throughout the day today.  She reports some mild coexisting nausea and dyspnea intermittently, but primarily left-sided chest pain.  Reports she felt fine last night going to bed, but awoke with the discomfort this morning and has been present throughout the day today.  Had difficulty getting a ride, so she was unable to get here until this evening.  No syncope or falls, cough, fever or emesis.   Physical Exam   Triage Vital Signs: ED Triage Vitals  Enc Vitals Group     BP 06/12/22 1939 (!) 171/97     Pulse Rate 06/12/22 1939 63     Resp 06/12/22 1939 20     Temp 06/12/22 1939 98.3 F (36.8 C)     Temp Source 06/12/22 1939 Oral     SpO2 06/12/22 1939 97 %     Weight 06/12/22 1936 245 lb (111.1 kg)     Height 06/12/22 1936 '5\' 9"'$  (1.753 m)     Head Circumference --      Peak Flow --      Pain Score 06/12/22 1936 9     Pain Loc --      Pain Edu? --      Excl. in [place]? --     Most recent vital signs: Vitals:   06/13/22 0253 06/13/22 0253  BP:  (!) 167/130  Pulse: 66 64  Resp: 15 18  Temp:  98 F (36.7 C)  SpO2: 100% 100%    General: Awake, no distress.  CV:  Good peripheral perfusion.  Resp:  Normal effort.  Abd:  No distention.  MSK:  No deformity noted.  Left-sided reproducible discomfort on palpation to the chest without overlying skin changes or signs of trauma. Neuro:  No focal deficits appreciated. Other:     ED Results / Procedures / Treatments   Labs (all labs ordered  are listed, but only abnormal results are displayed) Labs Reviewed  BASIC METABOLIC PANEL - Abnormal; Notable for the following components:      Result Value   Potassium 3.4 (*)    Glucose, Bld 148 (*)    BUN 31 (*)    Creatinine, Ser 1.05 (*)    GFR, Estimated 55 (*)    All other components within normal limits  CBC - Abnormal; Notable for the following components:   Hemoglobin 11.2 (*)    All other components within normal limits  HEPATIC FUNCTION PANEL - Abnormal; Notable for the following components:   Total Protein 8.3 (*)    All other components within normal limits  BRAIN NATRIURETIC PEPTIDE - Abnormal; Notable for the following components:   B Natriuretic Peptide 367.7 (*)    All other components within normal limits  TROPONIN I (HIGH SENSITIVITY) - Abnormal; Notable for the following components:   Troponin I (High Sensitivity) 19 (*)    All other components within normal limits  TROPONIN I (HIGH SENSITIVITY) - Abnormal; Notable for the following components:  Troponin I (High Sensitivity) 25 (*)    All other components within normal limits  LIPASE, BLOOD    EKG Sinus rhythm with couple PACs, rate of 64 bpm.  Left bundle.  No STEMI by [place] criteria.  Treatments baseline.  Nonspecific ST changes laterally and inferiorly. Fairly similar morphology compared to June comparison  RADIOLOGY CXR interpreted by me without evidence of acute cardiopulmonary pathology.  Official radiology report(s): CT Angio Chest PE W and/or Wo Contrast  Result Date: 06/13/2022 CLINICAL DATA:  Breast cancer.  Chest pain, dyspnea. EXAM: CT ANGIOGRAPHY CHEST WITH CONTRAST TECHNIQUE: Multidetector CT imaging of the chest was performed using the standard protocol during bolus administration of intravenous contrast. Multiplanar CT image reconstructions and MIPs were obtained to evaluate the vascular anatomy. RADIATION DOSE REDUCTION: This exam was performed according to the departmental  dose-optimization program which includes automated exposure control, adjustment of the mA and/or kV according to patient size and/or use of iterative reconstruction technique. CONTRAST:  100 cc Omnipaque 350 COMPARISON:  01/31/2017 FINDINGS: Cardiovascular: There is adequate opacification of the pulmonary arterial tree. No intraluminal filling defect identified to suggest acute pulmonary embolism. The central pulmonary arteries are enlarged in keeping with changes of pulmonary arterial hypertension, similar to prior examination. Mild to moderate cardiomegaly, stable since prior examination. Mild coronary artery calcification. No pericardial effusion. Moderate atherosclerotic calcification within the thoracic aorta. No aortic aneurysm. 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 No pathologic thoracic adenopathy. Esophagus unremarkable. Small hiatal hernia. Lungs/Pleura: Mild bibasilar atelectasis. Lungs are otherwise clear. No pneumothorax or pleural effusion. Central airways are widely patent. Upper Abdomen: No acute abnormality. Musculoskeletal: Osseous structures are age-appropriate. No acute bone abnormality. No lytic or blastic bone lesion. Review of the MIP images confirms the above findings. IMPRESSION: 1. No pulmonary embolism. No acute intrathoracic pathology identified. 2. Stable mild to moderate cardiomegaly. Mild coronary artery calcification. 3. Stable enlargement of the central pulmonary arteries in keeping with changes of pulmonary arterial hypertension. 4. Multinodular goiter, not well characterized on this examination. If indicated, this could be better assessed with dedicated thyroid sonography or thyroid scintigraphy. Electronically Signed   By: Fidela Salisbury M.D.   On: 06/13/2022 03:09   DG Chest 2 View  Result Date: 06/12/2022 CLINICAL DATA:  Chest pain EXAM: CHEST - 2 VIEW COMPARISON:  Chest x-ray 06/12/2022 FINDINGS: The aorta is  ectatic. The heart is enlarged, unchanged. Lung volumes are low. There is no pleural effusion or pneumothorax. No focal lung infiltrate identified. No acute fractures are seen. IMPRESSION: Low lung volumes. No acute cardiopulmonary process. Electronically Signed   By: Ronney Asters M.D.   On: 06/12/2022 20:13    PROCEDURES and INTERVENTIONS:  .1-3 Lead EKG Interpretation  Performed by: Vladimir Crofts, MD Authorized by: Vladimir Crofts, MD     Interpretation: normal     ECG rate:  71   ECG rate assessment: normal     Rhythm: sinus rhythm     Ectopy: none     Conduction: normal   .Critical Care  Performed by: Vladimir Crofts, MD Authorized by: Vladimir Crofts, MD   Critical care provider statement:    Critical care time (minutes):  30   Critical care time was exclusive of:  Separately billable procedures and treating other patients   Critical care was necessary to treat or prevent imminent or life-threatening deterioration of the following conditions:  Respiratory failure   Critical care was time spent personally by me on the  following activities:  Development of treatment plan with patient or surrogate, discussions with consultants, evaluation of patient's response to treatment, examination of patient, ordering and review of laboratory studies, ordering and review of radiographic studies, ordering and performing treatments and interventions, pulse oximetry, re-evaluation of patient's condition and review of old charts   Medications  lidocaine (LIDODERM) 5 % 1 patch (1 patch Transdermal Patch Applied 06/13/22 0104)  acetaminophen (TYLENOL) tablet 1,000 mg (1,000 mg Oral Given 06/13/22 0103)  ondansetron (ZOFRAN) injection 4 mg (4 mg Intravenous Given 06/13/22 0153)  iohexol (OMNIPAQUE) 350 MG/ML injection 100 mL (100 mLs Intravenous Contrast Given 06/13/22 0214)  furosemide (LASIX) injection 40 mg (40 mg Intravenous Given 06/13/22 0321)     IMPRESSION / MDM / [place] / ED COURSE  I  reviewed the triage vital signs and the nursing notes.  Differential diagnosis includes, but is not limited to, ACS, PTX, PNA, muscle strain/spasm, PE, dissection  {Patient presents with symptoms of an acute illness or injury that is potentially life-threatening.  76 year old patient presents to the ED with left-sided chest discomfort throughout the day today.  No STEMI on EKG and troponins are just marginally elevated and flat on repeat.  CKD around baseline on her metabolic panel and no significant hematologic derangements are noted.  We will treat symptomatically for the possibility of MSK discomfort and CTA her chest  Her BNP returns elevated and she becomes hypoxic while resting.  I am most concerned about a CHF exacerbation.  Her CTA chest is reassuring without signs of acute PE, the does have signs of pulmonary hypertension.  We will initiate diuresis and consult with medicine.  Clinical Course as of 06/13/22 0402  Fri Jun 13, 2022  0349 I consult with hospitalist who agrees to admit. [DS]    Clinical Course User Index [DS] Vladimir Crofts, MD     FINAL CLINICAL IMPRESSION(S) / ED DIAGNOSES   Final diagnoses:  Other chest pain  Acute on chronic diastolic congestive heart failure ([place])  Hypoxia     Rx / DC Orders   ED Discharge Orders     None        Note:  This document was prepared using Dragon voice recognition software and may include unintentional dictation errors.   Vladimir Crofts, MD 06/13/22 [id number]

## 2022-06-12 NOTE — ED Triage Notes (Signed)
Pt reports chest pain since this  am.  Intermittent sob.  No n/v/  no back pain.  No diaphoresis.  Pt alert

## 2022-06-13 ENCOUNTER — Emergency Department: Payer: Medicare Other

## 2022-06-13 DIAGNOSIS — H9193 Unspecified hearing loss, bilateral: Secondary | ICD-10-CM | POA: Diagnosis present

## 2022-06-13 DIAGNOSIS — Z8249 Family history of ischemic heart disease and other diseases of the circulatory system: Secondary | ICD-10-CM | POA: Diagnosis not present

## 2022-06-13 DIAGNOSIS — E669 Obesity, unspecified: Secondary | ICD-10-CM | POA: Diagnosis present

## 2022-06-13 DIAGNOSIS — K219 Gastro-esophageal reflux disease without esophagitis: Secondary | ICD-10-CM | POA: Diagnosis present

## 2022-06-13 DIAGNOSIS — I2721 Secondary pulmonary arterial hypertension: Secondary | ICD-10-CM | POA: Diagnosis present

## 2022-06-13 DIAGNOSIS — I2489 Other forms of acute ischemic heart disease: Secondary | ICD-10-CM | POA: Diagnosis present

## 2022-06-13 DIAGNOSIS — Z853 Personal history of malignant neoplasm of breast: Secondary | ICD-10-CM | POA: Diagnosis not present

## 2022-06-13 DIAGNOSIS — I5033 Acute on chronic diastolic (congestive) heart failure: Secondary | ICD-10-CM

## 2022-06-13 DIAGNOSIS — Z923 Personal history of irradiation: Secondary | ICD-10-CM | POA: Diagnosis not present

## 2022-06-13 DIAGNOSIS — E042 Nontoxic multinodular goiter: Secondary | ICD-10-CM | POA: Diagnosis present

## 2022-06-13 DIAGNOSIS — Z9221 Personal history of antineoplastic chemotherapy: Secondary | ICD-10-CM | POA: Diagnosis not present

## 2022-06-13 DIAGNOSIS — Z87891 Personal history of nicotine dependence: Secondary | ICD-10-CM | POA: Diagnosis not present

## 2022-06-13 DIAGNOSIS — N1831 Chronic kidney disease, stage 3a: Secondary | ICD-10-CM | POA: Diagnosis present

## 2022-06-13 DIAGNOSIS — I161 Hypertensive emergency: Secondary | ICD-10-CM

## 2022-06-13 DIAGNOSIS — R079 Chest pain, unspecified: Secondary | ICD-10-CM | POA: Diagnosis not present

## 2022-06-13 DIAGNOSIS — J9601 Acute respiratory failure with hypoxia: Secondary | ICD-10-CM | POA: Diagnosis present

## 2022-06-13 DIAGNOSIS — N179 Acute kidney failure, unspecified: Secondary | ICD-10-CM | POA: Diagnosis present

## 2022-06-13 DIAGNOSIS — D631 Anemia in chronic kidney disease: Secondary | ICD-10-CM | POA: Diagnosis present

## 2022-06-13 DIAGNOSIS — R0789 Other chest pain: Secondary | ICD-10-CM | POA: Diagnosis present

## 2022-06-13 DIAGNOSIS — Z79899 Other long term (current) drug therapy: Secondary | ICD-10-CM | POA: Diagnosis not present

## 2022-06-13 DIAGNOSIS — R0902 Hypoxemia: Secondary | ICD-10-CM

## 2022-06-13 DIAGNOSIS — Z803 Family history of malignant neoplasm of breast: Secondary | ICD-10-CM | POA: Diagnosis not present

## 2022-06-13 DIAGNOSIS — Z79811 Long term (current) use of aromatase inhibitors: Secondary | ICD-10-CM | POA: Diagnosis not present

## 2022-06-13 DIAGNOSIS — G4733 Obstructive sleep apnea (adult) (pediatric): Secondary | ICD-10-CM | POA: Diagnosis present

## 2022-06-13 DIAGNOSIS — Z170 Estrogen receptor positive status [ER+]: Secondary | ICD-10-CM | POA: Diagnosis not present

## 2022-06-13 DIAGNOSIS — D638 Anemia in other chronic diseases classified elsewhere: Secondary | ICD-10-CM | POA: Diagnosis not present

## 2022-06-13 DIAGNOSIS — I130 Hypertensive heart and chronic kidney disease with heart failure and stage 1 through stage 4 chronic kidney disease, or unspecified chronic kidney disease: Secondary | ICD-10-CM | POA: Diagnosis present

## 2022-06-13 HISTORY — DX: Hypertensive emergency: I16.1

## 2022-06-13 HISTORY — DX: Acute respiratory failure with hypoxia: J96.01

## 2022-06-13 LAB — HEPATIC FUNCTION PANEL
ALT: 12 U/L (ref 0–44)
AST: 15 U/L (ref 15–41)
Albumin: 3.6 g/dL (ref 3.5–5.0)
Alkaline Phosphatase: 63 U/L (ref 38–126)
Bilirubin, Direct: <0.1 mg/dL (ref 0.0–0.2)
Total Bilirubin: 0.6 mg/dL (ref 0.3–1.2)
Total Protein: 8.3 g/dL — ABNORMAL HIGH (ref 6.5–8.1)

## 2022-06-13 LAB — CBC
HCT: 37.4 % (ref 36.0–46.0)
Hemoglobin: 11.7 g/dL — ABNORMAL LOW (ref 12.0–15.0)
MCH: 27.4 pg (ref 26.0–34.0)
MCHC: 31.3 g/dL (ref 30.0–36.0)
MCV: 87.6 fL (ref 80.0–100.0)
Platelets: 334 10*3/uL (ref 150–400)
RBC: 4.27 MIL/uL (ref 3.87–5.11)
RDW: 13.3 % (ref 11.5–15.5)
WBC: 8.4 10*3/uL (ref 4.0–10.5)
nRBC: 0 % (ref 0.0–0.2)

## 2022-06-13 LAB — CREATININE, SERUM
Creatinine, Ser: 0.97 mg/dL (ref 0.44–1.00)
GFR, Estimated: >60 mL/min (ref >60)

## 2022-06-13 LAB — LIPASE, BLOOD: Lipase: 32 U/L (ref 11–51)

## 2022-06-13 LAB — BRAIN NATRIURETIC PEPTIDE: B Natriuretic Peptide: 367.7 pg/mL — ABNORMAL HIGH (ref 0.0–100.0)

## 2022-06-13 MED ORDER — ONDANSETRON HCL 4 MG/2ML IJ SOLN
4.0000 mg | Freq: Once | INTRAMUSCULAR | Status: AC
  Administered 2022-06-13: 4 mg via INTRAVENOUS
  Filled 2022-06-13: qty 2

## 2022-06-13 MED ORDER — ACETAMINOPHEN 650 MG RE SUPP: 650.0000 mg | Freq: Four times a day (QID) | RECTAL | Status: DC | PRN

## 2022-06-13 MED ORDER — FUROSEMIDE 10 MG/ML IJ SOLN
40.0000 mg | Freq: Once | INTRAMUSCULAR | Status: AC
  Administered 2022-06-13: 40 mg via INTRAVENOUS
  Filled 2022-06-13: qty 4

## 2022-06-13 MED ORDER — POTASSIUM CHLORIDE CRYS ER 20 MEQ PO TBCR
20.0000 meq | EXTENDED_RELEASE_TABLET | Freq: Two times a day (BID) | ORAL | Status: DC
  Administered 2022-06-13 – 2022-06-15 (×5): 20 meq via ORAL
  Filled 2022-06-13 (×5): qty 1

## 2022-06-13 MED ORDER — LOSARTAN POTASSIUM 25 MG PO TABS
25.0000 mg | ORAL_TABLET | Freq: Every day | ORAL | Status: DC
  Administered 2022-06-13 – 2022-06-15 (×3): 25 mg via ORAL
  Filled 2022-06-13 (×3): qty 1

## 2022-06-13 MED ORDER — ASPIRIN 81 MG PO TBEC
81.0000 mg | DELAYED_RELEASE_TABLET | Freq: Every day | ORAL | Status: DC
  Administered 2022-06-13 – 2022-06-15 (×3): 81 mg via ORAL
  Filled 2022-06-13 (×3): qty 1

## 2022-06-13 MED ORDER — ANASTROZOLE 1 MG PO TABS
1.0000 mg | ORAL_TABLET | Freq: Every day | ORAL | Status: DC
  Administered 2022-06-13 – 2022-06-14 (×2): 1 mg via ORAL
  Filled 2022-06-13 (×3): qty 1

## 2022-06-13 MED ORDER — ACETAMINOPHEN 325 MG PO TABS
650.0000 mg | ORAL_TABLET | Freq: Four times a day (QID) | ORAL | Status: DC | PRN
  Administered 2022-06-13 – 2022-06-14 (×2): 650 mg via ORAL
  Filled 2022-06-13 (×2): qty 2

## 2022-06-13 MED ORDER — ONDANSETRON HCL 4 MG/2ML IJ SOLN
4.0000 mg | Freq: Four times a day (QID) | INTRAMUSCULAR | Status: DC | PRN
  Administered 2022-06-13 (×2): 4 mg via INTRAVENOUS
  Filled 2022-06-13 (×2): qty 2

## 2022-06-13 MED ORDER — ONDANSETRON HCL 4 MG PO TABS
4.0000 mg | ORAL_TABLET | Freq: Four times a day (QID) | ORAL | Status: DC | PRN
  Administered 2022-06-14: 4 mg via ORAL
  Filled 2022-06-13: qty 1

## 2022-06-13 MED ORDER — HYDRALAZINE HCL 20 MG/ML IJ SOLN
5.0000 mg | INTRAMUSCULAR | Status: DC | PRN
  Administered 2022-06-13: 5 mg via INTRAVENOUS
  Filled 2022-06-13: qty 1

## 2022-06-13 MED ORDER — FUROSEMIDE 10 MG/ML IJ SOLN
40.0000 mg | Freq: Two times a day (BID) | INTRAMUSCULAR | Status: DC
  Administered 2022-06-13 – 2022-06-14 (×2): 40 mg via INTRAVENOUS
  Filled 2022-06-13 (×2): qty 4

## 2022-06-13 MED ORDER — IOHEXOL 350 MG/ML SOLN
100.0000 mL | Freq: Once | INTRAVENOUS | Status: AC | PRN
  Administered 2022-06-13: 100 mL via INTRAVENOUS

## 2022-06-13 MED ORDER — ISOSORBIDE MONONITRATE ER 60 MG PO TB24
60.0000 mg | ORAL_TABLET | Freq: Two times a day (BID) | ORAL | Status: DC
  Administered 2022-06-13 – 2022-06-15 (×5): 60 mg via ORAL
  Filled 2022-06-13 (×5): qty 1

## 2022-06-13 MED ORDER — LIDOCAINE 5 % EX PTCH
1.0000 | MEDICATED_PATCH | CUTANEOUS | Status: DC
  Administered 2022-06-13 – 2022-06-14 (×3): 1 via TRANSDERMAL
  Filled 2022-06-13 (×3): qty 1

## 2022-06-13 MED ORDER — ACETAMINOPHEN 500 MG PO TABS
1000.0000 mg | ORAL_TABLET | Freq: Once | ORAL | Status: AC
  Administered 2022-06-13: 1000 mg via ORAL
  Filled 2022-06-13: qty 2

## 2022-06-13 MED ORDER — ENOXAPARIN SODIUM 60 MG/0.6ML IJ SOSY
0.5000 mg/kg | PREFILLED_SYRINGE | INTRAMUSCULAR | Status: DC
  Administered 2022-06-13 – 2022-06-15 (×3): 55 mg via SUBCUTANEOUS
  Filled 2022-06-13 (×3): qty 0.6

## 2022-06-13 NOTE — Assessment & Plan Note (Addendum)
Hypertensive emergency - POA, resolved Recent Echo in June 2023 with G1 DD and EF 50 to 55%. --Treated with hydralazine IV PRN initially --Diuresed with IV Lasix, held once Cr worsened slightly. --Resumed torsemide 40 mg daily at d/c --Continue Imdur, Cozaar  --Daily weights  --Low sodium diet --Monitor renal function and electrolytes

## 2022-06-13 NOTE — Assessment & Plan Note (Signed)
Continue Arimidex. Patient follows with oncology.  Last seen by Dr. [name] on 06/10/2022

## 2022-06-13 NOTE — H&P (Signed)
History and Physical    Patient: [name] AOZ:[id number] DOB: Mar 03, 1946 DOA: 06/12/2022 DOS: the patient was seen and examined on 06/13/2022 PCP: Donnie Coffin, MD  Patient coming from: Home  Chief Complaint:  Chief Complaint  Patient presents with   Chest Pain    HPI: [name] is a 76 y.o. female with medical history significant for Obesity, breast cancer currently on Arimidex, diastolic CHF (G1 DD, EF 50 to 55% 03/04/2022), HTN, sleep apnea, chronic lower extremity wounds followed at the wound center, CKD 3a, hospitalized in June 23 with a syncopal episode attributed to AKI related to her diuretic therapy, who presents to the ED with chest pain and shortness of breath associated with nausea.  Patient states she was in her usual state of health before going to bed 2 nights ago but woke up with chest pain and shortness of breath and has had intermittent discomfort throughout the day and finally decided to come into the ER to be evaluated.  She denies cough or fever. ED course and data review: BP on arrival 171/97 going as high as 167/130.  Vitals were otherwise unremarkable except for O2 sat was was initially in the high 90s on room air but with desaturations to the low 80s when asleep requiring placement of O2 at 2 L via nasal cannula with recovery to the mid 90s.  Labs significant for troponin of 19-25 and BNP 367 and otherwise was at baseline with creatinine 1.05 and hemoglobin 11.2.  EKG, personally viewed and interpreted showed sinus at 64 with PACs and nonspecific ST-T wave changes. CTA PE protocol was negative for PE, showing the following: IMPRESSION: 1. No pulmonary embolism. No acute intrathoracic pathology identified. 2. Stable mild to moderate cardiomegaly. Mild coronary artery calcification. 3. Stable enlargement of the central pulmonary arteries in keeping with changes of pulmonary arterial hypertension. 4. Multinodular goiter, not well characterized on this  examination. If indicated, this could be better assessed with dedicated thyroid sonography or thyroid scintigraphy.  Patient treated with IV Lasix and hospitalist consulted for admission.   Review of Systems: As mentioned in the history of present illness. All other systems reviewed and are negative.  Past Medical History:  Diagnosis Date   Abnormality of gait    Breast cancer ([place]) 02/05/2005   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    Breast cancer ([place]) 2019   right breast   CHF (congestive heart failure) ([place])    March 28, 2017 echo: EF 50-55%; 1) echo 07/2011: EF 20-25%, mild concentric hypertrophy, diffuse HK, regional WMA cannot be excluded, grade 1 DD, mitral valve with mild regurg, LA mildly dilated). 2) EF 40-45%, mild AS,                     Complication of anesthesia 2006   pt states "hard to wake up" after L breast surgery    Edema    GERD (gastroesophageal reflux disease)    Headache    Heart murmur    Hypertension    Hypokinesis    global, EF 35-45 % from Echo.   Malignant neoplasm of breast (female), unspecified site 06/14/2018   RIGHT T1b, N0; ER/PR positive, HER-2 negative.   Neuropathy    Obesity    Pain in joint, site unspecified    Personal history of chemotherapy 2006   left breast   Personal history of radiation therapy 2019   right breast  Personal history of radiation therapy 2006   left breast   Pneumonia    Sleep apnea    Tinea pedis    Unspecified hearing loss    bilateral   Past Surgical History:  Procedure Laterality Date   BREAST BIOPSY Right 2019   11 oc-INVASIVE MAMMARY CARCINOMA, NO SPECIAL TYPE   BREAST BIOPSY Right 2019   1030 oc- neg   BREAST LUMPECTOMY  2006   left breast    CATARACT EXTRACTION     left eye   HERNIA REPAIR  05/25/2006   Incarcerated omentum in ventral hernia, Ventralight mesh, combined lap/ open with omental resection.     PARTIAL MASTECTOMY WITH NEEDLE LOCALIZATION Right 06/14/2018   Procedure: PARTIAL MASTECTOMY WITH NEEDLE LOCALIZATION;  Surgeon: Robert Bellow, MD;  Location: [facility] ORS;  Service: General;  Laterality: Right;   PORT-A-CATH REMOVAL  05/25/2006   SENTINEL NODE BIOPSY Right 06/14/2018   Procedure: SENTINEL NODE BIOPSY;  Surgeon: Robert Bellow, MD;  Location: [facility] ORS;  Service: General;  Laterality: Right;   TUBAL LIGATION     Social History:  reports that she quit smoking about 21 years ago. Her smoking use included cigarettes. She has a 15.00 pack-year smoking history. She has never used smokeless tobacco. She reports that she does not currently use drugs after having used the following drugs: Marijuana. She reports that she does not drink alcohol.  No Known Allergies  Family History  Problem Relation Age of Onset   Hypertension Mother        deceased 25   Cataracts Mother    Breast cancer Other 84       maternal half-sister; deceased 89   Coronary artery disease Other     Prior to Admission medications   Medication Sig Start Date End Date Taking? Authorizing Provider  acetaminophen-codeine (TYLENOL #3) 300-30 MG tablet Take 1-2 tablets by mouth every 4 (four) hours as needed for moderate pain. 06/05/22   Felipa Furnace, DPM  anastrozole (ARIMIDEX) 1 MG tablet TAKE 1 TABLET BY MOUTH ONCE DAILY. PTAIENT MUST MAKE AN APPOINTMENT TO SEE DR RAO 04/29/22   Sindy Guadeloupe, MD  aspirin EC 81 MG tablet Take 81 mg by mouth daily.    [provider]  cloNIDine (CATAPRES) 0.1 MG tablet Take 1 tablet (0.1 mg total) by mouth 2 (two) times daily. 03/06/22   Fritzi Mandes, MD  [employer] CALCIUM + D3 600-20 MG-MCG TABS TAKE 1 TABLET BY MOUTH TWICE A DAY 02/04/22   Sindy Guadeloupe, MD  diclofenac Sodium (VOLTAREN) 1 % GEL Apply 1 application topically as needed. 04/27/20   [provider]  Docusate Sodium 100 MG capsule Take 100 mg by mouth 2 (two) times daily as needed. 02/13/22   [provider]  Incontinence Supply Disposable (BLADDER CONTROL PADS EX ABSORB) MISC Apply 1 Dose topically as needed.  03/22/18   [provider]  isosorbide mononitrate (IMDUR) 60 MG 24 hr tablet Take 1 tablet (60 mg total) by mouth 2 (two) times daily. 04/20/13 03/03/22  Minna Merritts, MD  loratadine (CLARITIN) 10 MG tablet Take 10 mg by mouth daily. 07/20/19   [provider]  losartan (COZAAR) 25 MG tablet Take 1 tablet (25 mg total) by mouth daily. 08/05/21   Minna Merritts, MD  methocarbamol (ROBAXIN) 500 MG tablet Take by mouth. 06/05/22   [provider]  [facility] 17 GM/SCOOP powder SMARTSIG:2 Scoopful By Mouth Daily PRN 04/10/21   [provider]  omeprazole (PRILOSEC) 40 MG capsule Take 40 mg by mouth daily. 04/13/21   [provider]  oxybutynin (DITROPAN) 5 MG tablet Take 5 mg by mouth 2 (two) times daily.    [provider]  potassium chloride SA (KLOR-CON) 20 MEQ tablet Take 1 tablet (20 mEq total) by mouth 2 (two) times daily. 08/05/21   Minna Merritts, MD  Torsemide 40 MG TABS Take 40 mg by mouth daily in the afternoon. 03/06/22   Fritzi Mandes, MD  triamcinolone cream (KENALOG) 0.1 % Apply 1 application topically 2 (two) times daily. Back of legs 03/21/19   [provider]    Physical Exam: Vitals:   06/13/22 0134 06/13/22 0252 06/13/22 0253 06/13/22 0253  BP: (!) 190/91   (!) 167/130  Pulse: 77 73 66 64  Resp: 18 15 15 18   Temp:    98 F (36.7 C)  TempSrc:      SpO2: 99% 100% 100% 100%  Weight:      Height:       Physical Exam Vitals and nursing note reviewed.  Constitutional:      General: She is sleeping. She is not in acute distress. HENT:     Head: Normocephalic and atraumatic.  Cardiovascular:     Rate and Rhythm: Normal rate and regular rhythm.     Heart sounds: Normal heart sounds.  Pulmonary:     Effort: Pulmonary effort is normal.     Breath sounds: Normal breath sounds.  Abdominal:      Palpations: Abdomen is soft.     Tenderness: There is no abdominal tenderness.  Neurological:     Mental Status: She is easily aroused. Mental status is at baseline.     Labs on Admission: I have personally reviewed following labs and imaging studies  CBC: Recent Labs  Lab 06/12/22 1938  WBC 8.3  HGB 11.2*  HCT 36.2  MCV 87.0  PLT 893   Basic Metabolic Panel: Recent Labs  Lab 06/12/22 1938  NA 142  K 3.4*  CL 103  CO2 27  GLUCOSE 148*  BUN 31*  CREATININE 1.05*  CALCIUM 9.5   GFR: Estimated Creatinine Clearance: 60.6 mL/min (A) (by C-G formula based on SCr of 1.05 mg/dL (H)). Liver Function Tests: Recent Labs  Lab 06/13/22 0115  AST 15  ALT 12  ALKPHOS 63  BILITOT 0.6  PROT 8.3*  ALBUMIN 3.6   Recent Labs  Lab 06/13/22 0115  LIPASE 32   No results for input(s): "AMMONIA" in the last 168 hours. Coagulation Profile: No results for input(s): "INR", "PROTIME" in the last 168 hours. Cardiac Enzymes: No results for input(s): "CKTOTAL", "CKMB", "CKMBINDEX", "TROPONINI" in the last 168 hours. BNP (last 3 results) No results for input(s): "PROBNP" in the last 8760 hours. HbA1C: No results for input(s): "HGBA1C" in the last 72 hours. CBG: No results for input(s): "GLUCAP" in the last 168 hours. Lipid Profile: No results for input(s): "CHOL", "HDL", "LDLCALC", "TRIG", "CHOLHDL", "LDLDIRECT" in the last 72 hours. Thyroid Function Tests: No results for input(s): "TSH", "T4TOTAL", "FREET4", "T3FREE", "THYROIDAB" in the last 72 hours. Anemia Panel: No results for input(s): "VITAMINB12", "FOLATE", "FERRITIN", "TIBC", "IRON", "RETICCTPCT" in the last 72 hours. Urine analysis:    Component Value Date/Time   COLORURINE YELLOW (A) 03/03/2022 0208   APPEARANCEUR CLEAR (A) 03/03/2022 0208   APPEARANCEUR Clear 03/22/2014 1620   LABSPEC 1.023 03/03/2022 0208   LABSPEC 1.020 03/22/2014 1620   PHURINE 5.0 03/03/2022 0208  GLUCOSEU NEGATIVE 03/03/2022 0208    GLUCOSEU Negative 03/22/2014 1620   HGBUR NEGATIVE 03/03/2022 0208   BILIRUBINUR NEGATIVE 03/03/2022 0208   BILIRUBINUR Negative 03/22/2014 1620   KETONESUR NEGATIVE 03/03/2022 0208   PROTEINUR 30 (A) 03/03/2022 0208   NITRITE NEGATIVE 03/03/2022 0208   LEUKOCYTESUR LARGE (A) 03/03/2022 0208   LEUKOCYTESUR Negative 03/22/2014 1620    Radiological Exams on Admission: CT Angio Chest PE W and/or Wo Contrast  Result Date: 06/13/2022 CLINICAL DATA:  Breast cancer.  Chest pain, dyspnea. EXAM: CT ANGIOGRAPHY CHEST WITH CONTRAST TECHNIQUE: Multidetector CT imaging of the chest was performed using the standard protocol during bolus administration of intravenous contrast. Multiplanar CT image reconstructions and MIPs were obtained to evaluate the vascular anatomy. RADIATION DOSE REDUCTION: This exam was performed according to the departmental dose-optimization program which includes automated exposure control, adjustment of the mA and/or kV according to patient size and/or use of iterative reconstruction technique. CONTRAST:  100 cc Omnipaque 350 COMPARISON:  01/31/2017 FINDINGS: Cardiovascular: There is adequate opacification of the pulmonary arterial tree. No intraluminal filling defect identified to suggest acute pulmonary embolism. The central pulmonary arteries are enlarged in keeping with changes of pulmonary arterial hypertension, similar to prior examination. Mild to moderate cardiomegaly, stable since prior examination. Mild coronary artery calcification. No pericardial effusion. Moderate atherosclerotic calcification within the thoracic aorta. No aortic aneurysm. 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 No pathologic thoracic adenopathy. Esophagus unremarkable. Small hiatal hernia. Lungs/Pleura: Mild bibasilar atelectasis. Lungs are otherwise clear. No pneumothorax or pleural effusion. Central airways are widely patent. Upper Abdomen: No acute  abnormality. Musculoskeletal: Osseous structures are age-appropriate. No acute bone abnormality. No lytic or blastic bone lesion. Review of the MIP images confirms the above findings. IMPRESSION: 1. No pulmonary embolism. No acute intrathoracic pathology identified. 2. Stable mild to moderate cardiomegaly. Mild coronary artery calcification. 3. Stable enlargement of the central pulmonary arteries in keeping with changes of pulmonary arterial hypertension. 4. Multinodular goiter, not well characterized on this examination. If indicated, this could be better assessed with dedicated thyroid sonography or thyroid scintigraphy. Electronically Signed   By: Fidela Salisbury M.D.   On: 06/13/2022 03:09   DG Chest 2 View  Result Date: 06/12/2022 CLINICAL DATA:  Chest pain EXAM: CHEST - 2 VIEW COMPARISON:  Chest x-ray 06/12/2022 FINDINGS: The aorta is ectatic. The heart is enlarged, unchanged. Lung volumes are low. There is no pleural effusion or pneumothorax. No focal lung infiltrate identified. No acute fractures are seen. IMPRESSION: Low lung volumes. No acute cardiopulmonary process. Electronically Signed   By: Ronney Asters M.D.   On: 06/12/2022 20:13     Data Reviewed: Relevant notes from primary care and specialist visits, past discharge summaries as available in EHR, including Care Everywhere. Prior diagnostic testing as pertinent to current admission diagnoses Updated medications and problem lists for reconciliation ED course, including vitals, labs, imaging, treatment and response to treatment Triage notes, nursing and pharmacy notes and ED provider's notes Notable results as noted in HPI   Assessment and Plan: * Chest pain Elevated troponin Suspect chest pain and elevated troponin both related to demand ischemia from pretense of emergency   treat CHF and BP control Can consider further risk stratification for chest pain persistent  Acute on chronic diastolic CHF (congestive heart failure)  ([place]) Hypertensive emergency Continue IV Lasix Continue Imdur, Cozaar for BP control with hydralazine IV as needed Daily weights with intake and output monitoring Patient had echo in  June 2023 with G1 DD and EF 50 to 55% so will not repeat  Hypoxia OSA not on CPAP Suspect related to CHF and OSA Supplemental O2. Patient does not want CPAP Treat CHF  Stage 3a chronic kidney disease ([place]) Renal function at baseline  Anemia of chronic disease Hemoglobin at baseline  Breast cancer (HCC) Continue Arimidex. Patient follows with oncology.  Last seen by Dr. [name] on 06/10/2022        DVT prophylaxis: Lovenox  Consults: none  Advance Care Planning:   Code Status: Prior   Family Communication: none  Disposition Plan: Back to previous home environment  Severity of Illness: The appropriate patient status for this patient is OBSERVATION. Observation status is judged to be reasonable and necessary in order to provide the required intensity of service to ensure the patient's safety. The patient's presenting symptoms, physical exam findings, and initial radiographic and laboratory data in the context of their medical condition is felt to place them at decreased risk for further clinical deterioration. Furthermore, it is anticipated that the patient will be medically stable for discharge from the hospital within 2 midnights of admission.   Author: Athena Masse, MD 06/13/2022 3:55 AM  For on call review [email or web address].

## 2022-06-13 NOTE — Assessment & Plan Note (Signed)
Renal function at baseline 

## 2022-06-13 NOTE — Assessment & Plan Note (Addendum)
Initial O2 sats were in the mid to high 90s on room air but later developed desats into the low 80s, improved on 2 L/min [place] oxygen. Resolved - stable on room air.  OSA not on CPAP Hypoxia likely secondary to CHF decompensation and underlying OSA --supplemental O2, wean to room air as tolerated -- Patient declines CPAP -- Treated CHF as outlined

## 2022-06-13 NOTE — ED Notes (Signed)
Patient's O2 saturation went down into the low 80's when she fell asleep. Woke patient up, and her saturation went up into the mid-90's. Placed patient on 2L nasal cannula for support while she sleeps.

## 2022-06-13 NOTE — Assessment & Plan Note (Signed)
See above. 

## 2022-06-13 NOTE — Progress Notes (Signed)
The patient is admitted from ED to  2 A 257. She's A & O with some forgetfulness. Full assessment to epic completed. Will continue to monitor.

## 2022-06-13 NOTE — Assessment & Plan Note (Addendum)
Elevated troponin in the setting of decompensated CHF and hypertensive emergency, consistent with demand ischemia. Chest pain resolved with BP control.  Monitor. -- Stat EKG and repeat troponin if patient develops active chest pain

## 2022-06-13 NOTE — Progress Notes (Addendum)
Same day as admission, brief rounding note.  Pt admitted after midnight for acute on chronic CHF with volume overloaded, was started on IV diuresis.  She presented for evaluation of chest pain/discomfort and shortness of breath.  She was in hypertensive emergency and chest pain improved with BP control.  Troponin's and EKG were unremarkable, chest pain unlikely due to ACS.  Interval history: pt seen in the ED, holding for a bed.  She reports pain in her right thigh and asks me to rub it for her.  She denies having chest pain right now.  Appears very drowsy, curled up on her side leaning on the stretcher railing.  No other acute complaints right now.  Exam: General exam: awake, drowsy appearing, no acute distress HEENT: moist mucus membranes, hearing grossly normal  Respiratory system: diminished throughout, no wheezes, normal respiratory effort. Cardiovascular system: normal S1/S2, RRR.   Gastrointestinal system: soft, NT, ND Central nervous system: no gross focal neurologic deficits, normal speech Extremities: lower legs in unna boot appearing wraps and hose, tenderness of palpation along lateral right thigh Skin: dry, intact, normal temperature Psychiatry: normal mood, congruent affect   Assessment & Plan: As outlined in full H&P by Dr. Damita Dunnings. I have reviewed and agree with her plan and orders. --change to inpatient status, continuing IV diuresis   Family communication -- updated daughter by phone this evening   No charge

## 2022-06-13 NOTE — Progress Notes (Signed)
PHARMACIST - PHYSICIAN COMMUNICATION  CONCERNING:  Enoxaparin (Lovenox) for DVT Prophylaxis    RECOMMENDATION: Patient was prescribed enoxaprin '40mg'$  q24 hours for VTE prophylaxis.   Filed Weights   06/12/22 1936  Weight: 111.1 kg (245 lb)    Body mass index is 36.18 kg/m.  Estimated Creatinine Clearance: 60.6 mL/min (A) (by C-G formula based on SCr of 1.05 mg/dL (H)).   Based on [place] patient is candidate for enoxaparin 0.'5mg'$ /kg TBW SQ every 24 hours based on BMI being >30.  DESCRIPTION: Pharmacy has adjusted enoxaparin dose per [facility] policy.  Patient is now receiving enoxaparin 0.5 mg/kg every 24 hours   Renda Rolls, PharmD, [facility] 06/13/2022 4:05 AM

## 2022-06-14 DIAGNOSIS — N1831 Chronic kidney disease, stage 3a: Secondary | ICD-10-CM | POA: Diagnosis not present

## 2022-06-14 DIAGNOSIS — I5033 Acute on chronic diastolic (congestive) heart failure: Secondary | ICD-10-CM | POA: Diagnosis not present

## 2022-06-14 DIAGNOSIS — D638 Anemia in other chronic diseases classified elsewhere: Secondary | ICD-10-CM

## 2022-06-14 LAB — BASIC METABOLIC PANEL
Anion gap: 5 (ref 5–15)
BUN: 26 mg/dL — ABNORMAL HIGH (ref 8–23)
CO2: 32 mmol/L (ref 22–32)
Calcium: 8.7 mg/dL — ABNORMAL LOW (ref 8.9–10.3)
Chloride: 103 mmol/L (ref 98–111)
Creatinine, Ser: 1.28 mg/dL — ABNORMAL HIGH (ref 0.44–1.00)
GFR, Estimated: 43 mL/min — ABNORMAL LOW (ref >60)
Glucose, Bld: 131 mg/dL — ABNORMAL HIGH (ref 70–99)
Potassium: 3.6 mmol/L (ref 3.5–5.1)
Sodium: 140 mmol/L (ref 135–145)

## 2022-06-14 LAB — MAGNESIUM: Magnesium: 2 mg/dL (ref 1.7–2.4)

## 2022-06-14 NOTE — Hospital Course (Signed)
Pt with history of breast cancer currently on Arimidex, diastolic CHF (G1 DD, EF 50 to 55% 03/04/2022), HTN, sleep apnea, chronic lower extremity wounds followed at the wound center, CKD 3a, hospitalized in June 23 with a syncopal episode attributed to AKI related to her diuretic therapy, obesity who was admitted early AM on 06/13/2022 for acute on chronic CHF with volume overload.  She was started on IV diuresis.  She presented for evaluation of chest pain/discomfort and shortness of breath.  She was in hypertensive emergency and chest pain improved with BP control.  Troponin's and EKG were unremarkable, chest pain unlikely due to ACS.  10/7: IV Lasix stopped due to mildly worsening renal function.  Remains on 1 L/min oxygen this AM.  10/8: weaned to room air, O2 sats stable. Renal function recovering.  Pt is medically stable for discharge home.

## 2022-06-14 NOTE — Assessment & Plan Note (Signed)
Initial BP was 190/91 with chest pain and CHF decompensation.  BPs improved. Clonidine 0.1 mg BID noted on home med list, question if HTN crisis due to missing doses and rebound HTN.  --BP's have been controlled without clonidine.  Will make clonidine AS NEEDED at discharge to use in SBP>150 or DBP>95 -- Continue losartan, Imdur --Resume home torsemide 40 mg PO daily --Needs CLOSE outpatient follow up --Home BP monitoring 2-3 times daily

## 2022-06-14 NOTE — Progress Notes (Signed)
Progress Note   Patient: [name] [id number]:[id number] DOB: Dec 26, 1945 DOA: 06/12/2022     1 DOS: the patient was seen and examined on 06/14/2022   Brief hospital course: Pt with history of breast cancer currently on Arimidex, diastolic CHF (G1 DD, EF 50 to 55% 03/04/2022), HTN, sleep apnea, chronic lower extremity wounds followed at the wound center, CKD 3a, hospitalized in June 23 with a syncopal episode attributed to AKI related to her diuretic therapy, obesity who was admitted early AM on 06/13/2022 for acute on chronic CHF with volume overload.  She was started on IV diuresis.  She presented for evaluation of chest pain/discomfort and shortness of breath.  She was in hypertensive emergency and chest pain improved with BP control.  Troponin's and EKG were unremarkable, chest pain unlikely due to ACS.  10/7: IV Lasix stopped due to mildly worsening renal function.  Remains on 1 L/min oxygen this AM.  Assessment and Plan: * Chest pain Elevated troponin in the setting of decompensated CHF and hypertensive emergency, consistent with demand ischemia. Chest pain resolved with BP control.  Monitor. -- Stat EKG and repeat troponin if patient develops active chest pain  Acute on chronic diastolic CHF (congestive heart failure) ([place]) Hypertensive emergency - POA, resolved Recent Echo in June 2023 with G1 DD and EF 50 to 55%. --Stop IV Lasix (Cr rising) --Continue Imdur, Cozaar  --Hydralazine IV PRN --Daily weights with intake and output  --Monitor renal function and electrolytes   Acute respiratory failure with hypoxia (HCC) Initial O2 sats were in the mid to high 90s on room air but later developed desats into the low 80s, improved on 2 L/min [place] oxygen.  OSA not on CPAP Hypoxia likely secondary to CHF decompensation and underlying OSA --supplemental O2, wean to room air as tolerated -- Patient declines CPAP -- Treat CHF as outlined  Hypertensive emergency Initial BP was 190/91 with chest  pain and CHF decompensation.  BPs improved. -- Continue losartan, Imdur -- As needed IV hydralazine -- Off diuretics for now to monitor renal function improvement  Stage 3a chronic kidney disease ([place]) Renal function at baseline  Anemia of chronic disease Hemoglobin at baseline  Breast cancer (HCC) Continue Arimidex. Patient follows with oncology.  Last seen by Dr. [name] on 06/10/2022        Subjective: Patient was sleeping but woke easily to voice this morning when seen on rounds.  She denies acute complaints other than being tired.  Says her feet hurt, no worse than baseline.  Currently on 1 L/min with RN at bedside at plan to wean today.  Physical Exam: Vitals:   06/14/22 0500 06/14/22 0506 06/14/22 0830 06/14/22 1245  BP:  134/67 (!) 151/74 133/66  Pulse:  70 86 86  Resp:  '16 16 18  '$ Temp:  98.7 F (37.1 C) 99 F (37.2 C) 99.2 F (37.3 C)  TempSrc:  Oral  Oral  SpO2:  100% 100% 94%  Weight: 112.8 kg     Height:       General exam: Sleeping, woke to voice and tactile stimulus, no acute distress obese, chronically ill-appearing HEENT: moist mucus membranes, hearing grossly normal  Respiratory system: CTAB diminished due to body habitus, no wheezes, rales or rhonchi, normal respiratory effort.  On 1 L/min  O2 Cardiovascular system: normal S1/S2, RRR.   Gastrointestinal system: soft, NT, ND Central nervous system: no gross focal neurologic deficits, normal speech Extremities: Unna boots and TED hose on lower extremities bilaterally, no edema, normal  tone Psychiatry: normal mood, congruent affect   Data Reviewed:  Notable labs: Creatinine 1.28 from 0.97, normal electrolytes  Family Communication: Updated daughter by phone yesterday evening 10/6  Disposition: Status is: Inpatient Remains inpatient appropriate because: Severity of illness, requires monitoring for renal function improvement and resumption of home diuretic regimen.  Anticipate discharge tomorrow    Planned Discharge Destination: Home    Time spent: 40 minutes  Author: Ezekiel Slocumb, DO 06/14/2022 3:12 PM  For on call review [email or web address].

## 2022-06-15 DIAGNOSIS — J9601 Acute respiratory failure with hypoxia: Secondary | ICD-10-CM | POA: Diagnosis not present

## 2022-06-15 DIAGNOSIS — I161 Hypertensive emergency: Secondary | ICD-10-CM | POA: Diagnosis not present

## 2022-06-15 DIAGNOSIS — I5033 Acute on chronic diastolic (congestive) heart failure: Secondary | ICD-10-CM | POA: Diagnosis not present

## 2022-06-15 DIAGNOSIS — N1831 Chronic kidney disease, stage 3a: Secondary | ICD-10-CM | POA: Diagnosis not present

## 2022-06-15 LAB — BASIC METABOLIC PANEL
Anion gap: 3 — ABNORMAL LOW (ref 5–15)
BUN: 22 mg/dL (ref 8–23)
CO2: 31 mmol/L (ref 22–32)
Calcium: 8.7 mg/dL — ABNORMAL LOW (ref 8.9–10.3)
Chloride: 103 mmol/L (ref 98–111)
Creatinine, Ser: 1.05 mg/dL — ABNORMAL HIGH (ref 0.44–1.00)
GFR, Estimated: 55 mL/min — ABNORMAL LOW (ref >60)
Glucose, Bld: 104 mg/dL — ABNORMAL HIGH (ref 70–99)
Potassium: 3.9 mmol/L (ref 3.5–5.1)
Sodium: 137 mmol/L (ref 135–145)

## 2022-06-15 MED ORDER — CLONIDINE HCL 0.1 MG PO TABS: 0.1000 mg | ORAL_TABLET | Freq: Two times a day (BID) | ORAL | 0 refills | Status: DC | PRN

## 2022-06-15 MED ORDER — TORSEMIDE 20 MG PO TABS: 40.0000 mg | ORAL_TABLET | Freq: Every day | ORAL | Status: DC

## 2022-06-15 NOTE — Discharge Summary (Signed)
Physician Discharge Summary   Patient: [name] MRN: [id number] DOB: 07/15/1946  Admit date:     06/12/2022  Discharge date: 06/16/22  Discharge Physician: Ezekiel Slocumb   PCP: Donnie Coffin, MD   Recommendations at discharge:    Follow up with Primary Care in 1 week Repeat BMP, CBC in 1 week  Follow up with Cardiology Monitor Blood Pressure and home and follow up closely.  Clonidine was made AS NEEDED at time of discharge, as BP's were controlled with it held.  Pt presented in hypertensive emergency, unclear if this was rebound HTN from missing clonidine doses or not.  Would consider alternative agent in its place if additional BP control is needed.   Discharge Diagnoses: Principal Problem:   Chest pain Active Problems:   Acute on chronic diastolic CHF (congestive heart failure) (HCC)   Hypertensive emergency   Breast cancer ([place])   Anemia of chronic disease   Stage 3a chronic kidney disease ([place])  Resolved Problems:   Acute respiratory failure with hypoxia [facility])  Hospital Course: Pt with history of breast cancer currently on Arimidex, diastolic CHF (G1 DD, EF 50 to 55% 03/04/2022), HTN, sleep apnea, chronic lower extremity wounds followed at the wound center, CKD 3a, hospitalized in June 23 with a syncopal episode attributed to AKI related to her diuretic therapy, obesity who was admitted early AM on 06/13/2022 for acute on chronic CHF with volume overload.  She was started on IV diuresis.  She presented for evaluation of chest pain/discomfort and shortness of breath.  She was in hypertensive emergency and chest pain improved with BP control.  Troponin's and EKG were unremarkable, chest pain unlikely due to ACS.  10/7: IV Lasix stopped due to mildly worsening renal function.  Remains on 1 L/min oxygen this AM.  10/8: weaned to room air, O2 sats stable. Renal function recovering.  Pt is medically stable for discharge home.  Assessment and Plan: * Chest pain Elevated  troponin in the setting of decompensated CHF and hypertensive emergency, consistent with demand ischemia. Chest pain resolved with BP control.  Monitor. -- Stat EKG and repeat troponin if patient develops active chest pain  Acute on chronic diastolic CHF (congestive heart failure) ([place]) Hypertensive emergency - POA, resolved Recent Echo in June 2023 with G1 DD and EF 50 to 55%. --Treated with hydralazine IV PRN initially --Diuresed with IV Lasix, held once Cr worsened slightly. --Resumed torsemide 40 mg daily at d/c --Continue Imdur, Cozaar  --Daily weights  --Low sodium diet --Monitor renal function and electrolytes   Hypertensive emergency Initial BP was 190/91 with chest pain and CHF decompensation.  BPs improved. Clonidine 0.1 mg BID noted on home med list, question if HTN crisis due to missing doses and rebound HTN.  --BP's have been controlled without clonidine.  Will make clonidine AS NEEDED at discharge to use in SBP>150 or DBP>95 -- Continue losartan, Imdur --Resume home torsemide 40 mg PO daily --Needs CLOSE outpatient follow up --Home BP monitoring 2-3 times daily    Acute respiratory failure with hypoxia (HCC)-resolved as of 06/16/2022 Initial O2 sats were in the mid to high 90s on room air but later developed desats into the low 80s, improved on 2 L/min [place] oxygen. Resolved - stable on room air.  OSA not on CPAP Hypoxia likely secondary to CHF decompensation and underlying OSA --supplemental O2, wean to room air as tolerated -- Patient declines CPAP -- Treated CHF as outlined  Stage 3a chronic kidney disease ([place])  Renal function at baseline  Anemia of chronic disease Hemoglobin at baseline  Breast cancer (HCC) Continue Arimidex. Patient follows with oncology.  Last seen by Dr. [name] on 06/10/2022         Consultants: None Procedures performed: None  Disposition: Home Diet recommendation:  Cardiac diet   DISCHARGE MEDICATION: Allergies as of  06/15/2022   No Known Allergies      Medication List     STOP taking these medications    triamcinolone cream 0.1 % Commonly known as: KENALOG       TAKE these medications    acetaminophen-codeine 300-30 MG tablet Commonly known as: TYLENOL #3 Take 1-2 tablets by mouth every 4 (four) hours as needed for moderate pain.   anastrozole 1 MG tablet Commonly known as: ARIMIDEX TAKE 1 TABLET BY MOUTH ONCE DAILY. PTAIENT MUST MAKE AN APPOINTMENT TO SEE DR RAO   aspirin EC 81 MG tablet Take 81 mg by mouth daily.   Bladder Control Pads Ex Absorb Misc Apply 1 Dose topically as needed.   cloNIDine 0.1 MG tablet Commonly known as: CATAPRES Take 1 tablet (0.1 mg total) by mouth 2 (two) times daily as needed (systolic BP > 025 or diastolic BP > 95). What changed:  when to take this reasons to take this   [employer] Calcium + D3 600-20 MG-MCG Tabs Generic drug: Calcium Carb-Cholecalciferol TAKE 1 TABLET BY MOUTH TWICE A DAY   diclofenac Sodium 1 % Gel Commonly known as: VOLTAREN Apply 1 application topically as needed.   Docusate Sodium 100 MG capsule Take 100 mg by mouth 2 (two) times daily as needed.   isosorbide mononitrate 60 MG 24 hr tablet Commonly known as: Imdur Take 1 tablet (60 mg total) by mouth 2 (two) times daily.   loratadine 10 MG tablet Commonly known as: CLARITIN Take 10 mg by mouth daily.   losartan 25 MG tablet Commonly known as: COZAAR Take 1 tablet (25 mg total) by mouth daily.   methocarbamol 500 MG tablet Commonly known as: ROBAXIN Take by mouth.   MiraLax 17 GM/SCOOP powder Generic drug: polyethylene glycol powder SMARTSIG:2 Scoopful By Mouth Daily PRN   omeprazole 40 MG capsule Commonly known as: PRILOSEC Take 40 mg by mouth daily.   oxybutynin 5 MG tablet Commonly known as: DITROPAN Take 5 mg by mouth 2 (two) times daily.   potassium chloride SA 20 MEQ tablet Commonly known as: KLOR-CON M Take 1 tablet (20 mEq total) by mouth 2 (two)  times daily.   Torsemide 40 MG Tabs Take 40 mg by mouth daily in the afternoon.        Discharge Exam: Filed Weights   06/12/22 1936 06/14/22 0500  Weight: 111.1 kg 112.8 kg   General exam: awake, alert, no acute distress, obese HEENT: edentulous, moist mucus membranes, hearing grossly normal  Respiratory system: CTAB diminished bases due to poor inspiratory effort, no wheezes, normal respiratory effort, on room air. Cardiovascular system: normal S1/S2, RRR, improved lower extremity dependent edema   Gastrointestinal system: soft, NT, ND Central nervous system: A&O x2. no gross focal neurologic deficits, normal speech Extremities: improved dependent edema in thighs, unna boots and hose on distal LE's Skin: dry, intact, normal temperature Psychiatry: normal mood, congruent affect, judgement and insight appear normal   Condition at discharge: stable  The results of significant diagnostics from this hospitalization (including imaging, microbiology, ancillary and laboratory) are listed below for reference.   Imaging Studies: CT Angio Chest PE W and/or Wo Contrast  Result Date: 06/13/2022 CLINICAL DATA:  Breast cancer.  Chest pain, dyspnea. EXAM: CT ANGIOGRAPHY CHEST WITH CONTRAST TECHNIQUE: Multidetector CT imaging of the chest was performed using the standard protocol during bolus administration of intravenous contrast. Multiplanar CT image reconstructions and MIPs were obtained to evaluate the vascular anatomy. RADIATION DOSE REDUCTION: This exam was performed according to the departmental dose-optimization program which includes automated exposure control, adjustment of the mA and/or kV according to patient size and/or use of iterative reconstruction technique. CONTRAST:  100 cc Omnipaque 350 COMPARISON:  01/31/2017 FINDINGS: Cardiovascular: There is adequate opacification of the pulmonary arterial tree. No intraluminal filling defect identified to suggest acute pulmonary embolism. The  central pulmonary arteries are enlarged in keeping with changes of pulmonary arterial hypertension, similar to prior examination. Mild to moderate cardiomegaly, stable since prior examination. Mild coronary artery calcification. No pericardial effusion. Moderate atherosclerotic calcification within the thoracic aorta. No aortic aneurysm. 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 No pathologic thoracic adenopathy. Esophagus unremarkable. Small hiatal hernia. Lungs/Pleura: Mild bibasilar atelectasis. Lungs are otherwise clear. No pneumothorax or pleural effusion. Central airways are widely patent. Upper Abdomen: No acute abnormality. Musculoskeletal: Osseous structures are age-appropriate. No acute bone abnormality. No lytic or blastic bone lesion. Review of the MIP images confirms the above findings. IMPRESSION: 1. No pulmonary embolism. No acute intrathoracic pathology identified. 2. Stable mild to moderate cardiomegaly. Mild coronary artery calcification. 3. Stable enlargement of the central pulmonary arteries in keeping with changes of pulmonary arterial hypertension. 4. Multinodular goiter, not well characterized on this examination. If indicated, this could be better assessed with dedicated thyroid sonography or thyroid scintigraphy. Electronically Signed   By: Fidela Salisbury M.D.   On: 06/13/2022 03:09   DG Chest 2 View  Result Date: 06/12/2022 CLINICAL DATA:  Chest pain EXAM: CHEST - 2 VIEW COMPARISON:  Chest x-ray 06/12/2022 FINDINGS: The aorta is ectatic. The heart is enlarged, unchanged. Lung volumes are low. There is no pleural effusion or pneumothorax. No focal lung infiltrate identified. No acute fractures are seen. IMPRESSION: Low lung volumes. No acute cardiopulmonary process. Electronically Signed   By: Ronney Asters M.D.   On: 06/12/2022 20:13   CT ABDOMEN PELVIS W CONTRAST  Result Date: 05/30/2022 CLINICAL DATA:  Generalized abdominal  pain. EXAM: CT ABDOMEN AND PELVIS WITH CONTRAST TECHNIQUE: Multidetector CT imaging of the abdomen and pelvis was performed using the standard protocol following bolus administration of intravenous contrast. RADIATION DOSE REDUCTION: This exam was performed according to the departmental dose-optimization program which includes automated exposure control, adjustment of the mA and/or kV according to patient size and/or use of iterative reconstruction technique. CONTRAST:  91m OMNIPAQUE IOHEXOL 300 MG/ML  SOLN COMPARISON:  Remote CT 08/22/2012 FINDINGS: Lower chest: No basilar airspace disease or pleural effusion. There is mild multi chamber cardiomegaly. No pericardial effusion. There are coronary artery calcifications. Hepatobiliary: 10 mm cyst in the central liver is stable from remote prior exam and considered benign. No further follow-up is needed. No suspicious liver lesion. The gallbladder is decompressed, no calcified gallstone or pericholecystic inflammation. Please note that detailed assessment is limited by motion artifact through this region no biliary dilatation. Pancreas: Unremarkable. No pancreatic ductal dilatation or surrounding inflammatory changes. Spleen: Normal in size without focal abnormality. Adrenals/Urinary Tract: No adrenal nodule. There is a simple 4 cm cyst in the lateral upper right kidney that needs no further follow-up. More inferiorly in the lower lateral right kidney is a 19 mm lesion  that is isodense to adjacent renal parenchyma. Minimal left renal atrophy. No renal calculi. Partially distended unremarkable urinary bladder. Stomach/Bowel: Small hiatal hernia. Physiologic distention of the stomach. There is no bowel obstruction or inflammatory change. Normal appendix visualized in the right abdomen. Cecum is slightly high-riding. There is moderate stool in the ascending, transverse and proximal descending colon. Colonic redundancy particularly of the transverse colon. Sigmoid colon is  decompressed and not well assessed. Vascular/Lymphatic: Atherosclerosis of the abdominal aorta. No aneurysm. There is atherosclerosis involving the origin of the branch vessels. Patent portal vein. No abdominal or retroperitoneal adenopathy. There prominent and mildly enlarged bilateral inguinal lymph nodes, 15 mm on the left series 2, image 95, 14 and 12 mm on the right series 2, images 94 and 82 respectively. Prominent nodes are seen on remote prior exam, however lymph nodes have increased in size since that exam. Reproductive: Enlarged lobulated uterus typical of fibroids, poorly defined on the current exam. 2.2 cm soft tissue density structure in the right low pelvis series 2, image 67, may represent the ovary, however is nonspecific. This is slightly prominent in size for age if it is ovarian. Left ovary is tentatively visualized and quiescent. Other: No ascites. No omental thickening. Prior lower ventral abdominal hernia repair with mesh, however the mesh appears redundant. There is a small fat containing umbilical hernia that is not fixated by mesh. Musculoskeletal: Grade 1 anterolisthesis of L4 on L5 is likely facet mediated. There is diffuse degenerative disc disease and facet hypertrophy in the spine. Moderate right and mild left hip osteoarthritis. There are no acute or suspicious osseous abnormalities. IMPRESSION: 1. No acute abnormality in the abdomen/pelvis. 2. Enlarged lobulated uterus typical of fibroids, poorly defined on the current exam. There is a 2.2 cm soft tissue density structure in the right low pelvis which may represent the ovary, however is nonspecific. This is slightly prominent in size for age if it is ovarian. Recommend initial evaluation with pelvic ultrasound, however pelvic MRI may ultimately be needed to define the uterus and adnexal structures. 3. Prominent and mildly enlarged bilateral inguinal lymph nodes, nonspecific but likely reactive. 4. Indeterminate 19 mm lesion in the  lower lateral right kidney. Recommend renal protocol MRI for characterization. There is an additional simple cyst in the right kidney that needs no specific imaging follow-up. 5. Prior lower ventral abdominal hernia repair with mesh, however the mesh appears redundant. There is a small fat containing umbilical hernia that is not fixated by mesh. 6. Small hiatal hernia. Aortic Atherosclerosis (ICD10-I70.0). Electronically Signed   By: Keith Rake M.D.   On: 05/30/2022 17:24   MM 3D SCREEN BREAST BILATERAL  Result Date: 05/27/2022 CLINICAL DATA:  Screening. EXAM: DIGITAL SCREENING BILATERAL MAMMOGRAM WITH TOMOSYNTHESIS AND CAD TECHNIQUE: Bilateral screening digital craniocaudal and mediolateral oblique mammograms were obtained. Bilateral screening digital breast tomosynthesis was performed. The images were evaluated with computer-aided detection. COMPARISON:  Previous exam(s). ACR Breast Density Category b: There are scattered areas of fibroglandular density. FINDINGS: There are no findings suspicious for malignancy. IMPRESSION: No mammographic evidence of malignancy. A result letter of this screening mammogram will be mailed directly to the patient. RECOMMENDATION: Screening mammogram in one year. (Code:SM-B-01Y) BI-RADS CATEGORY  1: Negative. Electronically Signed   By: Audie Pinto M.D.   On: 05/27/2022 16:47   DG Bone Density  Result Date: 05/26/2022 EXAM: DUAL X-RAY ABSORPTIOMETRY (DXA) FOR BONE MINERAL DENSITY IMPRESSION: Your patient [name] completed a BMD test on 05/26/2022 using the [employer]  DXA System (software version: 14.10) manufactured by [employer]. The following summarizes the results of our evaluation. Technologist:VLM PATIENT BIOGRAPHICAL: Name: Donneisha, Beane Patient ID: [id number] Birth Date: 1946-05-23 Height: 69.0 in. Gender: Female Exam Date: 05/26/2022 Weight: 230.6 lbs. Indications: COPD, Advanced Age, Breast CA, History of Chemo, Postmenopausal  Fractures: Treatments: Arimidex, Calcium DENSITOMETRY RESULTS: Site      Region      Measured Date Measured Age WHO Classification Young Adult T-score BMD         %Change vs. Previous Significant Change (*) AP Spine L1-L4 (L2) 05/26/2022 76.5 Normal -0.3 1.148 g/cm2 -4.7% Yes AP Spine L1-L4 (L2) 05/23/2020 74.5 Normal 0.1 1.205 g/cm2 -0.6% - AP Spine L1-L4 (L2) 06/02/2018 72.5 Normal 0.2 1.212 g/cm2 - - DualFemur Total Right 05/26/2022 76.5 Normal -0.9 0.896 g/cm2 7.2% Yes DualFemur Total Right 05/23/2020 74.5 Osteopenia -1.4 0.836 g/cm2 -18.8% Yes DualFemur Total Right 06/02/2018 72.5 Normal 0.2 1.029 g/cm2 - - DualFemur Total Mean 05/26/2022 76.5 Normal -0.8 0.902 g/cm2 2.7% Yes DualFemur Total Mean 05/23/2020 74.5 Normal -1.0 0.878 g/cm2 -12.3% Yes DualFemur Total Mean 06/02/2018 72.5 Normal -0.1 1.001 g/cm2 - - ASSESSMENT: The BMD measured at Femur Total Right is 0.896 g/cm2 with a T-score of -0.9. This patient is considered normal according to [facility]) criteria. Compared with prior study, there has been significant decrease in the spine. Compared with prior study, there has been significant increas in the total hip. The scan quality was limited due to decreased range of motion for the hips. [facility]) criteria for post-menopausal, Caucasian Women: Normal:                   T-score at or above -1 SD Osteopenia/low bone mass: T-score between -1 and -2.5 SD Osteoporosis:             T-score at or below -2.5 SD RECOMMENDATIONS: 1. All patients should optimize calcium and vitamin D intake. 2. Consider FDA-approved medical therapies in postmenopausal women and men aged 85 years and older, based on the following: a. A hip or vertebral(clinical or morphometric) fracture b. T-score < -2.5 at the femoral neck or spine after appropriate evaluation to exclude secondary causes c. Low bone mass (T-score between -1.0 and -2.5 at the femoral neck or spine) and a 10-year probability  of a hip fracture > 3% or a 10-year probability of a major osteoporosis-related fracture > 20% based on the [place]-adapted WHO algorithm 3. Clinician judgment and/or patient preferences may indicate treatment for people with 10-year fracture probabilities above or below these levels FOLLOW-UP: People with diagnosed cases of osteoporosis or at high risk for fracture should have regular bone mineral density tests. For patients eligible for Medicare, routine testing is allowed once every 2 years. The testing frequency can be increased to one year for patients who have rapidly progressing disease, those who are receiving or discontinuing medical therapy to restore bone mass, or have additional risk factors. I have reviewed this report, and agree with the above findings. [facility] Electronically Signed   By: Ammie Ferrier M.D.   On: 05/26/2022 15:21    Microbiology: Results for orders placed or performed during the hospital encounter of 01/29/22  Aerobic/Anaerobic Culture w Gram Stain (surgical/deep wound)     Status: None   Collection Time: 01/29/22  6:03 PM   Specimen: Wound  Result Value Ref Range Status   Specimen Description   Final    WOUND RIGHT LEG Performed  at [facility], [address], [place], [place] [address]    Special Requests   Final    NONE Performed at [facility], [place], [place] [address]    Gram Stain   Final    NO WBC SEEN ABUNDANT GRAM POSITIVE COCCI RARE GRAM NEGATIVE RODS    Culture   Final    ABUNDANT PSEUDOMONAS AERUGINOSA ABUNDANT SERRATIA MARCESCENS MODERATE PROTEUS MIRABILIS ABUNDANT ENTEROCOCCUS FAECALIS NO ANAEROBES ISOLATED Performed at [facility], [place] [address] [place], [place] [address]    Report Status 02/03/2022 FINAL  Final   Organism ID, Bacteria PSEUDOMONAS AERUGINOSA  Final   Organism ID, Bacteria SERRATIA MARCESCENS  Final   Organism ID, Bacteria PROTEUS MIRABILIS  Final    Organism ID, Bacteria ENTEROCOCCUS FAECALIS  Final      Susceptibility   Enterococcus faecalis - MIC*    AMPICILLIN <=2 SENSITIVE Sensitive     VANCOMYCIN 1 SENSITIVE Sensitive     GENTAMICIN SYNERGY SENSITIVE Sensitive     * ABUNDANT ENTEROCOCCUS FAECALIS   Pseudomonas aeruginosa - MIC*    CEFTAZIDIME 8 SENSITIVE Sensitive     CIPROFLOXACIN <=0.25 SENSITIVE Sensitive     GENTAMICIN <=1 SENSITIVE Sensitive     IMIPENEM 0.5 SENSITIVE Sensitive     PIP/TAZO <=4 SENSITIVE Sensitive     CEFEPIME 4 SENSITIVE Sensitive     * ABUNDANT PSEUDOMONAS AERUGINOSA   Proteus mirabilis - MIC*    AMPICILLIN <=2 SENSITIVE Sensitive     CEFAZOLIN <=4 SENSITIVE Sensitive     CEFEPIME <=0.12 SENSITIVE Sensitive     CEFTAZIDIME <=1 SENSITIVE Sensitive     CEFTRIAXONE <=0.25 SENSITIVE Sensitive     CIPROFLOXACIN <=0.25 SENSITIVE Sensitive     GENTAMICIN <=1 SENSITIVE Sensitive     IMIPENEM 2 SENSITIVE Sensitive     TRIMETH/SULFA <=20 SENSITIVE Sensitive     AMPICILLIN/SULBACTAM <=2 SENSITIVE Sensitive     PIP/TAZO <=4 SENSITIVE Sensitive     * MODERATE PROTEUS MIRABILIS   Serratia marcescens - MIC*    CEFAZOLIN >=64 RESISTANT Resistant     CEFEPIME <=0.12 SENSITIVE Sensitive     CEFTAZIDIME <=1 SENSITIVE Sensitive     CEFTRIAXONE <=0.25 SENSITIVE Sensitive     CIPROFLOXACIN <=0.25 SENSITIVE Sensitive     GENTAMICIN <=1 SENSITIVE Sensitive     TRIMETH/SULFA <=20 SENSITIVE Sensitive     * ABUNDANT SERRATIA MARCESCENS    Labs: CBC: Recent Labs  Lab 06/12/22 1938 06/13/22 0627  WBC 8.3 8.4  HGB 11.2* 11.7*  HCT 36.2 37.4  MCV 87.0 87.6  PLT 326 768   Basic Metabolic Panel: Recent Labs  Lab 06/12/22 1938 06/13/22 0627 06/14/22 0602 06/15/22 0415  NA 142  --  140 137  K 3.4*  --  3.6 3.9  CL 103  --  103 103  CO2 27  --  32 31  GLUCOSE 148*  --  131* 104*  BUN 31*  --  26* 22  CREATININE 1.05* 0.97 1.28* 1.05*  CALCIUM 9.5  --  8.7* 8.7*  MG  --   --  2.0  --    Liver Function  Tests: Recent Labs  Lab 06/13/22 0115  AST 15  ALT 12  ALKPHOS 63  BILITOT 0.6  PROT 8.3*  ALBUMIN 3.6   CBG: No results for input(s): "GLUCAP" in the last 168 hours.  Discharge time spent: less than 30 minutes.  Signed: Ezekiel Slocumb, DO [facility] 06/16/2022

## 2022-06-15 NOTE — TOC Transition Note (Signed)
Transition of Care [facility]) - CM/SW Discharge Note   Patient Details  Name: Nykira Reddix MRN: [id number] Date of Birth: 1946/02/20  [facility] CM/SW Contact:  Harriet Masson, RN Phone Number:[phone] 06/15/2022, 9:43 AM   Clinical Narrative:    Spoke to pt today concerning discharge today. Pt states she will get her son to transport her home today. Pt has a WC and RW and indicates she has support system and able to afford. No other needs indicated for pt's safe discharge today.  TOC remains available for any additional needs.   Final next level of care: Home/Self Care Barriers to Discharge: Barriers Resolved, No Barriers Identified   Patient Goals and CMS Choice        Discharge Placement                    Patient and family notified of of transfer: 06/15/22 (Spoke directly with pt who will communicate with her son for transportation home today.)  Discharge Plan and Services                                     Social Determinants of Health (SDOH) Interventions Housing Interventions: Patient Refused   Readmission Risk Interventions     No data to display

## 2022-06-16 ENCOUNTER — Ambulatory Visit
Admission: RE | Admit: 2022-06-16 | Discharge: 2022-06-16 | Disposition: A | Discharge status: Home / Self Care | Payer: Medicare Other | Source: Ambulatory Visit | Attending: Family Medicine | Admitting: Family Medicine

## 2022-06-16 ENCOUNTER — Encounter: Payer: Self-pay | Admitting: Internal Medicine

## 2022-06-16 DIAGNOSIS — R1900 Intra-abdominal and pelvic swelling, mass and lump, unspecified site: Secondary | ICD-10-CM | POA: Diagnosis present

## 2022-06-16 DIAGNOSIS — N289 Disorder of kidney and ureter, unspecified: Secondary | ICD-10-CM

## 2022-06-16 MED ORDER — GADOBUTROL 1 MMOL/ML IV SOLN
10.0000 mL | Freq: Once | INTRAVENOUS | Status: AC | PRN
  Administered 2022-06-16: 10 mL via INTRAVENOUS

## 2022-06-17 ENCOUNTER — Encounter: Payer: Medicare Other | Admitting: Physician Assistant

## 2022-06-17 DIAGNOSIS — I872 Venous insufficiency (chronic) (peripheral): Secondary | ICD-10-CM | POA: Diagnosis not present

## 2022-06-17 DIAGNOSIS — N1831 Chronic kidney disease, stage 3a: Secondary | ICD-10-CM | POA: Diagnosis not present

## 2022-06-17 DIAGNOSIS — I509 Heart failure, unspecified: Secondary | ICD-10-CM | POA: Diagnosis not present

## 2022-06-17 DIAGNOSIS — I130 Hypertensive heart and chronic kidney disease with heart failure and stage 1 through stage 4 chronic kidney disease, or unspecified chronic kidney disease: Secondary | ICD-10-CM | POA: Diagnosis not present

## 2022-06-17 DIAGNOSIS — I890 Lymphedema, not elsewhere classified: Secondary | ICD-10-CM | POA: Diagnosis not present

## 2022-06-17 DIAGNOSIS — L97812 Non-pressure chronic ulcer of other part of right lower leg with fat layer exposed: Secondary | ICD-10-CM | POA: Diagnosis present

## 2022-06-17 NOTE — Progress Notes (Addendum)
IDABELLE, MCPETERS ([id number]) Visit Report for 06/17/2022 Chief Complaint Document Details Patient Name: MISAKO, ROEDER Date of Service: 06/17/2022 2:00 PM Medical Record Number: [id number] Patient Account Number: [id number] Date of Birth/Sex: 1946/09/04 (76 y.o. F) Treating RN: Carlene Coria Primary Care Provider: [name] Other Clinician: Referring Provider: [name] Treating Provider/Extender: Skipper Cliche in Treatment: 63 Information Obtained from: Patient Chief Complaint Bilateral LE Ulcers Electronic Signature(s) Signed: 06/17/2022 2:05:44 PM By: Worthy Keeler PA-C Entered By: Worthy Keeler on 06/17/2022 14:05:43 [name] ([id number]) -------------------------------------------------------------------------------- HPI Details Patient Name: [name] Date of Service: 06/17/2022 2:00 PM Medical Record Number: [id number] Patient Account Number: [id number] Date of Birth/Sex: 11-22-1945 (76 y.o. F) Treating RN: Carlene Coria Primary Care Provider: [name] Other Clinician: Referring Provider: [name] Treating Provider/Extender: Skipper Cliche in Treatment: 93 History of Present Illness HPI Description: 03/15/2020 upon evaluation today patient presents today for initial evaluation here in our clinic concerning a wound that is on the left posterior lower extremity. Unfortunately this has been giving the patient some discomfort at this point which she notes has been affected her pretty much daily. The patient does have a history of venous insufficiency, hypertension, congestive heart failure, and a history of breast cancer. Fortunately there does not appear to be evidence of active infection at this time which is great news. No fevers, chills, nausea, vomiting, or diarrhea. Wound is not extremely large but does have some slough covering the surface of the wound. This is going require sharp debridement today. 03/15/2020 upon evaluation today  patient appears to be doing fairly well in regard to her wound. She does have some slough noted on the surface of the wound currently but I do believe the Iodoflex has been beneficial over the past week. There is no signs of active infection at this time which is great news and overall very pleased with the progress. No fevers, chills, nausea, vomiting, or diarrhea. 04/02/2020 upon evaluation today patient appears to be doing a little better in regard to her wound size wise this is not tremendously smaller she does have some slough buildup on the surface of the wound. With that being said this is going require some sharp debridement today to clear away some of the necrotic debris. Fortunately there is no evidence of active infection at this time. No fevers, chills, nausea, vomiting, or diarrhea. 04/10/2020 on evaluation today patient appears to be doing well with regard to her ulcer which is measuring somewhat smaller today. She actually has more granulation tissue noted which is also good news is no need for sharp debridement today. Fortunately there is no evidence of infection either which is also excellent. No fevers, chills, nausea, vomiting, or diarrhea. 04/26/2020 on evaluation today patient's wound actually is showing signs of good improvement which is great news. There does not appear to be any evidence of active infection. I do believe she is tolerating the collagen at this point. 05/03/2020 upon evaluation today patient's wound actually showed signs of good granulation at this time there does not appear to be any evidence of active infection which is great news and overall very pleased with where things stand. With that being said I do believe that the patient is can require some sharp debridement today but fortunately nothing too significant. 05/11/20 on evaluation today patient appears to be doing well in regard to her leg ulcer. She's been tolerating the dressing changes without complication.  Fortunately there is no signs of active infection at this time.  Overall I'm very pleased with where things stand. 05/18/2020 upon evaluation today patient's wound is showing signs of improvement is very dry and I think the collagen for the most part just dried to the wound bed. I am going to clean this away with sharp debridement in order to get this under control in my opinion. 05/25/2020 upon evaluation today patient actually appears to be doing well in regard to her leg ulcer. In fact upon inspection it appears she could potentially be completely healed but that is not guaranteed based on what we are seeing. There does not appear to be any signs of active infection which is great news. 05/31/2020 on evaluation today patient appears to be doing well with regard to her wounds. She in fact appears to be almost completely healed on the left leg there is just a very small opening remaining point 1 06/08/2020 upon evaluation today patient appears to be doing excellent in regard to her leg ulcer on the left in fact it appears to be that she is completely healed as of today. I see no signs of active infection at this time which is great news. No fevers, chills, nausea, vomiting, or diarrhea. READMISSION 01/16/2021 Patient that we have had in this clinic with a wound on the left posterior calf in the summer 2021 extending into October. She was felt to have chronic venous insufficiency. Per the patient's description she was discharged with what sounds like external compression stockings although she could not afford the "$75 per leg". She therefore did not get anything. Apparently her wound that she has currently is been in existence since January. She has been followed at vein and vascular with Unna boots and I think calcium alginate. She had ABIs done on 06/25/2020 that showed an noncompressible ABI on the right leg and the left leg but triphasic waveforms with good great toe pressures and a TBI of 0.90 on the  right and 0.91 on the left Surprisingly the patient also had venous reflux studies that were really unremarkable. This included no evidence of DVTs or SVTs but there was no evidence of deep venous insufficiency or superficial venous insufficiency in the greater saphenous or short saphenous veins bilaterally. I would wonder about more central venous issues or perhaps this is all lymphedema 01/25/2021 upon evaluation today patient appears to be doing decently well and guard to her wound. The good news is the Iodoflex that is job she saw Dr. Dellia Nims last week for readmission and to be honest I think that this has done extremely well over that week. I do believe that she can tolerate the sharp debridement today which will be good. I think that cleaning off the wound will likely allow [place] to be able to use a different type of dressing I do not think the Iodoflex will even be necessary based on how clean the wound looks. Fortunately there is no sign of active infection at this time which is great news. No fevers, chills, nausea, vomiting, or diarrhea. 02/12/2021 upon evaluation today patient appears to be doing well with regard to her leg ulcer. We did switch to alginate when she came for nurse visit I think is doing much better for her. Fortunately there does not appear to be any signs of active infection at this time which is great news. No fevers, chills, nausea, vomiting, or diarrhea. WILLIE, LOY ([id number]) 02/18/2021 upon evaluation today patient's wound is actually showing signs of good improvement. I am very pleased with where things stand  currently. I do not see any signs of active infection which is great news and overall I feel like she is making good progress. The patient likewise is happy that things are doing so well and that this is measuring somewhat smaller. 02/25/2021 upon evaluation today patient actually appears to be doing quite well in regard to her wounds. She has been tolerating the  dressing changes without complication. Fortunately there does not appear to be any signs of active infection which is great news and overall I am extremely pleased with where things stand. No fevers, chills, nausea, vomiting, or diarrhea. She does have a lot of edema I really do think she needs compression socks to be worn in order to prevent things from causing additional issues for her currently. Especially on the right leg she should be wearing compression socks all the time to be honest. 03/04/2021 upon evaluation today patient's leg ulcer actually appears to be doing pretty well which is great news. There does not appear to be any significant change but overall the appearance is better even though the size is not necessarily reflecting a great improvement. Fortunately there does not appear to be any signs of active infection. No fevers, chills, nausea, vomiting, or diarrhea. 03/12/2021 upon evaluation today patient appears to be doing well with regard to her wounds. She has been tolerating the dressing changes without complication. The good news is that the wound on her left lateral leg is doing much better and is showing signs of good improvement. Overall her swelling is controlled well as well. She does need some compression socks she plans to go Jefferey socks next week. 03/18/2021 on evaluation today patient's wound actually is showing signs of good improvement. She does have a little bit of slough this can require some sharp debridement today. 03/25/2021 upon evaluation today patient appears to be doing well with regard to her wound on the left lateral leg. She has been tolerating the dressing changes without complication. Fortunately there does not appear to be any signs of active infection at this time. No fever chills no 04/01/2021 upon evaluation today patient's wound is showing signs of improvement and overall very pleased with where things stand at this point. There is no evidence of active  infection which is great news as well and in general I think that she is making great progress. 04/09/2021 upon evaluation today patient appears to be doing well with regard to her ankle ulcer. She is making good progress currently which is great news. There does not appear to be any signs of infection also excellent news. In general I am extremely pleased with where things stand today. No fevers, chills, nausea, vomiting, or diarrhea. 04/23/2021 upon inspection today patient appears to be doing quite well with regard to her leg ulcer. She has been tolerating the dressing changes without complication. She is can require some sharp debridement today but overall seems to be doing excellent. 05/06/2021 upon evaluation today patient unfortunately appears to be doing poorly overall in regard to her swelling. She is actually significantly more swollen than last week despite the fact that her wound is doing better this is not a good trend. I think that she probably needs to see her cardiologist ASAP and I discussed that with her today. This includes the fact that honestly I think she probably is getting need an increase in her diuretic or something to try to clear away some of the excess fluid that she is experiencing at the moment. She is  not been doing her pumps even twice a day but again right now more concerned that if she were to do the pump she would fluid overload her lungs causing other issues as far as her breathing is concerned. Obviously I think she needs to contact them and move up the appointment for the 12th of something sooner 2 weeks is a bit too long to wait with how swollen she has. 05/14/2021 upon evaluation today patient's wound actually showing signs of being a little bit smaller compared to previous. She has been making good progress however. Fortunately there does not appear to be any evidence of active infection which is great. Overall I am extremely pleased in that regard. Nonetheless I am  still concerned that she is having quite a bit of issue as far as the swelling is concerned she has been placed on fluid restriction as well as an increase in the furosemide by her doctor. We will see how things progress. 05/21/2021 upon evaluation today patient appears to be doing well with regard to her wound in general. She has been tolerating the dressing changes without complication. With that being said she has been using silver cell for a while and while this was showing signs of improvement it is now gotten to the point where this has slowed down quite significantly. I do believe that switching to a different dressing may be beneficial for her today. 05/28/2021 upon evaluation today patient actually appears to be doing quite well in regard to her wound. In fact there is really not any need for sharp debridement today based on what I see. The Hydrofera Blue is doing great and overall I think that she is managing quite nicely with the compression wrapping. Her edema is still down quite a bit with the wrapping in place. 06/11/2021 upon evaluation today patient appears to be doing well with regard to her leg ulcer. She has been tolerating the dressing changes without complication. Fortunately there does not appear to be any signs of active infection at this time which is great news. No fevers, chills, nausea, vomiting, or diarrhea. She is going require some sharp debridement today. 10/11; left leg ulcer much better looking and with improved surface area. She is using Hydrofera Blue under 3 layer compression. She does not have any stocking on the right leg which in itself has very significant nonpitting edema 06/25/2021 upon evaluation today patient appears to be doing well with regard to the wound on her left leg. She has been tolerating the dressing changes without complication and overall I am extremely pleased with where things stand today. I do not see any evidence of infection which is great  news. 07/02/2021 upon evaluation today patient's wound actually showing signs of excellent improvement and actually very pleased with where we stand today and the overall appearance. Fortunately there does not appear to be any signs of active infection. No fevers, chills, nausea, vomiting, or diarrhea. 07/09/2021 upon evaluation today patient appears to be doing better in regard to her wound this is measuring smaller and she is headed in the appropriate direction based on what I am seeing currently. I do not see any evidence of active infection systemically which is great news. 07/16/2021 upon evaluation today patient appears to be doing pretty well currently in regard to her leg ulcer. This is measuring smaller and looking much better this is great news. Fortunately there does not appear to be any evidence of active infection systemically which is great news as well.  07/23/2021 upon evaluation today patient's wound is actually showing signs of good improvement this is definitely measuring smaller. I do not see any signs of infection which is great news and overall very pleased in that regard. Overall I think that she is doing well with the Sway Guttierrez, [place] ([id number]) Elsa. 07/30/2020 upon evaluation today patient appears to be doing well in regard to her wound. She has been tolerating the dressing changes without complication think the Hydrofera Blue at this point is actually causing her to dry out a lot as far as the wound bed is concerned. Subsequently I think that we need to try to see what we can do to improve this. Fortunately there does not appear to be any evidence of active infection at this time which is great news. 08/06/2021 upon evaluation today patient appears to be doing well with regards to her wound. Is not measuring significantly smaller initial inspection but upon closer inspection she has a lot of new skin growth across the central portion of the wound. I think will  get very close to complete resolution. 08/13/2021 upon evaluation today patient appears to be doing well with regard to her wound. In fact this appears to be I believe healed although there is still some question as to whether it is completely so. I am concerned about the fact that to be honest she still has some evidence of dry skin that could be just trapping something underneath I still want to debride anything as I am afraid of causing some damage to the good skin for that reason I Georgina Peer probably monitor for 1 more week before completely closing everything out. 08/20/2021 upon evaluation today patient actually appears to be doing excellent. In fact she appears to be completely healed. This was the case last week as well we just wanted to make sure nothing reopened since she does not really have an ability to put on compression socks this is good to be something we need to make sure was well-healed. Nonetheless I think we have achieved that goal as of today. 12/27; this is a patient we discharged 2 weeks ago with wounds on her left lower leg laterally. She was supposed to get stockings and really did not get them. She developed blistering and reopening probably sometime late last week. She has 3 areas in the same area as previously. The mid calf dimensions on the left went from 39 to 51 cm today. She also has very significant edema on the right leg but as far as I am aware has not had wounds in this area 1/4; patient presents for follow-up. She has tolerated the compression wrap well. She has no issues or complaints today. 09/17/2021 upon evaluation today patient appears to be doing well with regard to her wounds. She has been tolerating the dressing changes without complication. Fortunately there does not appear to be any signs of active infection at this time. No fevers, chills, nausea, vomiting, or diarrhea. 10/01/2021 upon evaluation today patient appears to be doing well with regard to her  wound this is actually measuring better and looking smaller and I am very pleased in that regard. Fortunately I do not see any evidence of active infection locally nor systemically at this point. No fevers, chills, nausea, vomiting, or diarrhea. 10/08/2021 upon evaluation patient's wounds are actually showing some signs of improvement measuring a little bit smaller and looking a little bit better. Overall I do not think there is any need for sharp debridement today which  is good news. No fevers, chills, nausea, vomiting, or diarrhea. 10/14/2021 upon evaluation today patient appears to be doing well with regard to her wound. She has been tolerating the dressing changes without complication and overall I am extremely pleased with where things stand today. There is a little bit of film on the surface of the wounds but this was carefully cleaned away with just saline and gauze she really did not want me to do any sharp debridement today. 10/29/2021 upon evaluation today patient appears to be doing quite well in regard to her wounds. She is actually showing signs of excellent improvement which is great news and overall I feel like we are on the right track at this time. She does have a wound on both legs and she does need something compression wise when she heals for that reason we will get a go and see about ordering her bilateral juxta lite compression wraps. 11/05/2021 upon evaluation today patient appears to be doing well with regard to her wound. She has been tolerating the dressing changes without complication. Fortunately I do not see any evidence of active infection locally nor systemically at this time which is great news. No fevers, chills, nausea, vomiting, or diarrhea. 11/12/2021 upon evaluation today patient appears to be doing well with regard to her wounds. She is doing excellent on the left on the right this is not doing quite as well there is some slough and biofilm buildup. 3/14; patient  presents for follow-up. She has no issues or complaints today. She has tolerated the compression wraps well. 11/26/2021 upon evaluation today patient appears to be doing well with regard to her wounds. Both are showing signs of good improvement which is great news. I do not see any evidence of active infection and overall think she is healing quite nicely. 12/03/2021 upon evaluation today patient appears to be doing well with regard to her wounds. Both are showing signs of excellent improvement I am very pleased with where things stand and I think that she is making good progress here. Fortunately I do not see any signs of active infection locally or systemically which is great news. No fevers, chills, nausea, vomiting, or diarrhea. 12-10-2021 upon evaluation today patient's wounds actually appear to be doing awesome. I am extremely pleased with where we stand I think she is making excellent progress. I do not see any signs of active infection locally or systemically at this time. 12-17-2021 upon evaluation today patient's wounds are actually showing signs of good improvement. Fortunately I do not see any evidence of active infection locally nor systemically which is great news. Overall I do not believe that she is showing any signs of significant worsening which is good news. With that being said I do believe that she has a little bit of film on the surface of the wound I was actually able to clear this away with saline and gauze no sharp debridement was necessary. 12-24-2021 upon evaluation today patient appears to be doing well with regard to her wounds. I do not see any signs of active infection currently which is great news and overall I think that we are on the right track here. No fevers, chills, nausea, vomiting, or diarrhea. 12-31-2021 upon evaluation today patient appears to be doing well with regard to her wounds. Both are showing signs of improvement the wound on the right leg is going require  some sharp debridement on the left seems to be doing quite well. 01-07-2022 upon evaluation today patient  appears to be doing well currently in regard to her wounds. She is actually making good progress bilaterally. Both of them require little bit of sharp debridement but not too much which is great news. [place], [place] ([id number]) Upon inspection patient's wound bed actually showed signs of good granulation and epithelization on both legs. She still is having significant issues with the right leg compared to the left although I do feel like both are actually showing signs of pretty good improvement. 01-21-2022 upon evaluation today patient's wound is actually showing signs of excellent improvement I am very pleased with where we stand currently. I do not see any signs of active infection locally or systemically which is great news. No fevers, chills, nausea, vomiting, or diarrhea. 01-28-2022 upon evaluation today patient appears to be doing well with regard to her wound on the left leg this is pretty much just about healed which is great news. Unfortunately in regard to the wound on her right leg this is not doing nearly as well in fact I think we probably need to see about a culture today that was discussed with her. 02-04-2022 upon evaluation today patient appears to be doing well with regard to her left leg which in fact might actually be completely healed I am not 100% sure. Nonetheless this is shown signs of excellent improvement which is great news. With that being said in regards to the right leg there is some slough and film buildup on the surface of the wound although she is feeling much better than last week this does appear to be doing much better than it was last week. Fortunately there does not appear to be any signs of active infection locally or systemically at this time. 02-11-2022 upon evaluation today patient's wounds appear to be doing decently well. In fact the left leg is healed  although there is brand-new skin were definitely given still need to wrap her for the time being. On the right leg this is showing signs of some epithelial growth in the middle of the wound it still measures the same because there is still a larger area with speckled openings but again as far as the entire area being open this is significantly improved which is great news. I am very pleased. 6/13; left leg remains healed and she has a juxta light to apply to it today. There is some concern about whether she is going to be able to do this at home. She lives with a daughter although she has her own disability. On the right the wound looks smaller to me nice epithelialization good edema control. 02-25-2022 upon evaluation today patient appears to be doing well with regard to her legs the left leg is still healed the right leg is doing much better. I think we are on the right track here. With that being said unfortunately she continues to have significant issues with some other problems and she actually tells me on Saturday she was having lunch and went inside to sit down when she tells me she had all of a sudden a sharp sensation that went from her hand through her arm into her leg and her neck and she tells me that she could not see anything for the next hour it was completely black. Slowly after this her vision started to return and has been normal since. It sounds to me as if she may have had a TIA or mini stroke at this point. Nonetheless I think that she needs to have this  checked out ASAP. Her primary care provider is Dr. Clide Deutscher at [facility]. We will get a try to get in touch with him. 03-10-2022 upon evaluation today patient's wound actually showed signs of good improvement. She seems to be making excellent progress here. I do not see any evidence of active infection locally or systemically which is great news. No fevers, chills, nausea, vomiting, or diarrhea. She was in the hospital since have  seen her last she ended up finding out that she was having syncopal episodes. That is what was going on with the blacking out and losing vision. Subsequently they adjusted some of her medication she seems to be doing better. 03-18-2022 upon evaluation today patient's wound is actually showing signs of good granulation and epithelization at this point. Fortunately I do not see any signs of active infection locally or systemically which is great news. 03-25-2022 any evidence of active infection locally or systemically which is great news. Any evidence of active infection which is great news and overall the wound is looking better. Upon evaluation today patient appears to be doing well with regard to her leg ulcer. She has been tolerating the dressing changes without complication. Fortunately I do not see 7/25; the patient's wound is on the right lateral lower extremity using silver alginate 3 layer compression. She has chronic venous insufficiency and lymphedema. We are using a juxta lite on the left leg which does not have any open wounds but she is leaving this on all week it seems that there just is not a way to get this changed at home 04-08-2022 upon evaluation today patient appears to be doing well currently in regard to her wound. She has been tolerating the dressing changes without complication. Fortunately I do not see any evidence of active infection locally or systemically which is great news. No fever or chills noted her left leg has reopened at this point. 04-15-2022 upon evaluation patient's wounds actually are showing signs of improvement which is great news. Little by little I do believe her making progress here. In regard to the right leg this is looking better though the measurement does not speak to it there is a lot of new skin growth. On the left leg this is significantly smaller compared to last week most of the blistered area has completely closed. 04-22-2022 upon evaluation today  patient appears to be doing excellent in regard to her wounds both are showing signs of improvement. Her knees and above are still extremely swollen the legs were rewrapped them are down significantly. With that being said I do not think that we will get a be able to keep them that way once were not rapid noted that she really has no way to be able to put juxta lites on and off and nobody to help her with that. That is the biggest concern that we have here at this point. Fortunately I do not think that there is any evidence of infection which is great news. 04-29-2022 upon evaluation today patient appears to be doing well with regard to her wounds. She has been tolerating the dressing changes without complication. Fortunately there does not appear to be any evidence of infection locally or systemically which is great news and overall I am extremely pleased in that regard. Left leg is healed the right leg is very close we discussed today the possibility of her granddaughter helping with putting on and off compression wraps for her. 05-06-2022 upon evaluation today patient appears to be doing  well currently in regard to her left leg which is showing signs of being completely healed. With regard to her right leg she still has an open area though this is doing much better there is a lot less drainage than what we previously noted. Fortunately I see no evidence of active infection locally or systemically at this time which is great news. 05-20-2022 upon evaluation today patient appears to be doing excellent in regard to her leg. The left leg is still healed the right leg is much better. Fortunately I see no signs of infection locally or systemically at this time which is great news. No fevers, chills, nausea, vomiting, or diarrhea. 05-27-2022 upon evaluation today patient appears to be doing excellent in regard to her wound this is actually measuring smaller and looking much better. Fortunately I see no signs  of active infection locally or systemically at this time. 06-03-2022 upon evaluation patient's wound actually is measuring smaller and looking much better. She still has a lot of discomfort but fortunately nothing to significant at this point which is great news. No fevers, chills, nausea, vomiting, or diarrhea. I think we are making some pretty good DUFRANE, Jennette ([id number]) progress here. 06-10-2022 upon evaluation today patient appears to be doing well currently in regard to her wound which is actually measuring somewhat smaller. This is actually 2 separate wounds that are clustered and this is making it appear little bit larger than what it is but nonetheless we are seeing some definite improvement. Fortunately I do not see any signs of active infection at this time. No fevers, chills, nausea, vomiting, or diarrhea. 06-17-2022 upon evaluation today patient appears to be doing well currently in regard to her wound although she was in the hospital since she was last here. That was from 06-12-2022 through 06-16-2022 which was yesterday. This was due to a CHF exacerbation. With that being said the patient has noted in her record review acute on chronic congestive heart failure with hypertensive emergency noted. She also has stage IIIa chronic kidney disease noted. With that being said I do think her wound is much better because I got a lot of fluid off to try to diurese her to a degree but her kidneys would not hold for that they put her on furosemide 40 mg and that is helping to get the fluid off she is much better but still has a lot of fluid buildup. Electronic Signature(s) Signed: 06/17/2022 2:46:57 PM By: Worthy Keeler PA-C Entered By: Worthy Keeler on 06/17/2022 14:46:56 [name] ([id number]) -------------------------------------------------------------------------------- Physical Exam Details Patient Name: [name] Date of Service: 06/17/2022 2:00 PM Medical Record  Number: [id number] Patient Account Number: [id number] Date of Birth/Sex: September 12, 1945 (76 y.o. F) Treating RN: Carlene Coria Primary Care Provider: [name] Other Clinician: Referring Provider: [name] Treating Provider/Extender: Skipper Cliche in Treatment: 39 Constitutional Well-nourished and well-hydrated in no acute distress. Respiratory normal breathing without difficulty. Psychiatric this patient is able to make decisions and demonstrates good insight into disease process. Alert and Oriented x 3. pleasant and cooperative. Notes Upon inspection patient's wound bed actually showed signs of good granulation and epithelization at this point again I am very pleased with what I am seeing no sharp debridement was necessary today. Electronic Signature(s) Signed: 06/17/2022 2:47:14 PM By: Worthy Keeler PA-C Entered By: Worthy Keeler on 06/17/2022 14:47:14 [name] ([id number]) -------------------------------------------------------------------------------- Physician Orders Details Patient Name: [name] Date of Service: 06/17/2022 2:00 PM Medical Record Number: [id number] Patient  Account Number: [id number] Date of Birth/Sex: 07/24/1946 (76 y.o. F) Treating RN: Carlene Coria Primary Care Provider: [name] Other Clinician: Referring Provider: [name] Treating Provider/Extender: Skipper Cliche in Treatment: 71 Verbal / Phone Orders: No Diagnosis Coding ICD-10 Coding Code Description I89.0 Lymphedema, not elsewhere classified I87.332 Chronic venous hypertension (idiopathic) with ulcer and inflammation of left lower extremity L97.812 Non-pressure chronic ulcer of other part of right lower leg with fat layer exposed Follow-up Appointments o Return Appointment in 1 week. o Nurse Visit as needed Bathing/ Shower/ Hygiene o May shower with wound dressing protected with water repellent cover or cast protector. o No tub bath. Anesthetic (Use  'Patient Medications' Section for Anesthetic Order Entry) o Lidocaine applied to wound bed Edema Control - Lymphedema / Segmental Compressive Device / Other Bilateral Lower Extremities o Optional: One layer of unna paste to top of compression wrap (to act as an anchor). o 3 Layer Compression System for Lymphedema. - left o Elevate, Exercise Daily and Avoid Standing for Long Periods of Time. o Elevate legs to the level of the heart and pump ankles as often as possible o Elevate leg(s) parallel to the floor when sitting. o Compression Pump: Use compression pump on left lower extremity for 60 minutes, twice daily. - You may pump over wraps o Compression Pump: Use compression pump on right lower extremity for 60 minutes, twice daily. - You may pump over wraps Additional Orders / Instructions o Follow Nutritious Diet and Increase Protein Intake Wound Treatment Wound #4 - Lower Leg Wound Laterality: Right, Lateral Cleanser: Soap and Water 1 x Per Week/30 Days Discharge Instructions: Gently cleanse wound with antibacterial soap, rinse and pat dry prior to dressing wounds Cleanser: Wound Cleanser 1 x Per Week/30 Days Discharge Instructions: Wash your hands with soap and water. Remove old dressing, discard into plastic bag and place into trash. Cleanse the wound with Wound Cleanser prior to applying a clean dressing using gauze sponges, not tissues or cotton balls. Do not scrub or use excessive force. Pat dry using gauze sponges, not tissue or cotton balls. Primary Dressing: Silvercel Small 2x2 (in/in) 1 x Per Week/30 Days Discharge Instructions: Apply Silvercel Small 2x2 (in/in) as instructed Secondary Dressing: ABD Pad 5x9 (in/in) 1 x Per Week/30 Days Discharge Instructions: Cover with ABD pad Compression Wrap: 3-LAYER WRAP - Profore Lite LF 3 Multilayer Compression Bandaging System 1 x Per Week/30 Days Discharge Instructions: Apply 3 multi-layer wrap as prescribed. Electronic  Signature(s) Signed: 06/17/2022 6:10:21 PM By: Worthy Keeler PA-C Signed: 06/20/2022 12:25:01 PM By: Carlene Coria RN Wynetta Emery, Tiarna ([id number]) Entered By: Carlene Coria on 06/17/2022 14:41:53 ROOSEVELT, BISHER ([id number]) -------------------------------------------------------------------------------- Problem List Details Patient Name: [name] Date of Service: 06/17/2022 2:00 PM Medical Record Number: [id number] Patient Account Number: [id number] Date of Birth/Sex: Jan 18, 1946 (76 y.o. F) Treating RN: Carlene Coria Primary Care Provider: [name] Other Clinician: Referring Provider: [name] Treating Provider/Extender: Skipper Cliche in Treatment: 73 Active Problems ICD-10 Encounter Code Description Active Date MDM Diagnosis I89.0 Lymphedema, not elsewhere classified 01/16/2021 No Yes I87.332 Chronic venous hypertension (idiopathic) with ulcer and inflammation of 01/16/2021 No Yes left lower extremity L97.812 Non-pressure chronic ulcer of other part of right lower leg with fat layer 01/07/2022 No Yes exposed Inactive Problems ICD-10 Code Description Active Date Inactive Date L97.822 Non-pressure chronic ulcer of other part of left lower leg with fat layer exposed 01/16/2021 01/16/2021 Resolved Problems Electronic Signature(s) Signed: 06/17/2022 2:05:40 PM By: Worthy Keeler PA-C Entered By: Joaquim Lai  IIIMargarita Grizzle on 06/17/2022 14:05:40 AVANNAH, DECKER ([id number]) -------------------------------------------------------------------------------- Progress Note Details Patient Name: SENAI, RAMNATH Date of Service: 06/17/2022 2:00 PM Medical Record Number: [id number] Patient Account Number: [id number] Date of Birth/Sex: 1945-12-10 (76 y.o. F) Treating RN: Carlene Coria Primary Care Provider: [name] Other Clinician: Referring Provider: [name] Treating Provider/Extender: Skipper Cliche in Treatment: 73 Subjective Chief Complaint Information obtained  from Patient Bilateral LE Ulcers History of Present Illness (HPI) 03/15/2020 upon evaluation today patient presents today for initial evaluation here in our clinic concerning a wound that is on the left posterior lower extremity. Unfortunately this has been giving the patient some discomfort at this point which she notes has been affected her pretty much daily. The patient does have a history of venous insufficiency, hypertension, congestive heart failure, and a history of breast cancer. Fortunately there does not appear to be evidence of active infection at this time which is great news. No fevers, chills, nausea, vomiting, or diarrhea. Wound is not extremely large but does have some slough covering the surface of the wound. This is going require sharp debridement today. 03/15/2020 upon evaluation today patient appears to be doing fairly well in regard to her wound. She does have some slough noted on the surface of the wound currently but I do believe the Iodoflex has been beneficial over the past week. There is no signs of active infection at this time which is great news and overall very pleased with the progress. No fevers, chills, nausea, vomiting, or diarrhea. 04/02/2020 upon evaluation today patient appears to be doing a little better in regard to her wound size wise this is not tremendously smaller she does have some slough buildup on the surface of the wound. With that being said this is going require some sharp debridement today to clear away some of the necrotic debris. Fortunately there is no evidence of active infection at this time. No fevers, chills, nausea, vomiting, or diarrhea. 04/10/2020 on evaluation today patient appears to be doing well with regard to her ulcer which is measuring somewhat smaller today. She actually has more granulation tissue noted which is also good news is no need for sharp debridement today. Fortunately there is no evidence of infection either which is also  excellent. No fevers, chills, nausea, vomiting, or diarrhea. 04/26/2020 on evaluation today patient's wound actually is showing signs of good improvement which is great news. There does not appear to be any evidence of active infection. I do believe she is tolerating the collagen at this point. 05/03/2020 upon evaluation today patient's wound actually showed signs of good granulation at this time there does not appear to be any evidence of active infection which is great news and overall very pleased with where things stand. With that being said I do believe that the patient is can require some sharp debridement today but fortunately nothing too significant. 05/11/20 on evaluation today patient appears to be doing well in regard to her leg ulcer. She's been tolerating the dressing changes without complication. Fortunately there is no signs of active infection at this time. Overall I'm very pleased with where things stand. 05/18/2020 upon evaluation today patient's wound is showing signs of improvement is very dry and I think the collagen for the most part just dried to the wound bed. I am going to clean this away with sharp debridement in order to get this under control in my opinion. 05/25/2020 upon evaluation today patient actually appears to be doing well in regard to her  leg ulcer. In fact upon inspection it appears she could potentially be completely healed but that is not guaranteed based on what we are seeing. There does not appear to be any signs of active infection which is great news. 05/31/2020 on evaluation today patient appears to be doing well with regard to her wounds. She in fact appears to be almost completely healed on the left leg there is just a very small opening remaining point 1 06/08/2020 upon evaluation today patient appears to be doing excellent in regard to her leg ulcer on the left in fact it appears to be that she is completely healed as of today. I see no signs of active  infection at this time which is great news. No fevers, chills, nausea, vomiting, or diarrhea. READMISSION 01/16/2021 Patient that we have had in this clinic with a wound on the left posterior calf in the summer 2021 extending into October. She was felt to have chronic venous insufficiency. Per the patient's description she was discharged with what sounds like external compression stockings although she could not afford the "$75 per leg". She therefore did not get anything. Apparently her wound that she has currently is been in existence since January. She has been followed at vein and vascular with Unna boots and I think calcium alginate. She had ABIs done on 06/25/2020 that showed an noncompressible ABI on the right leg and the left leg but triphasic waveforms with good great toe pressures and a TBI of 0.90 on the right and 0.91 on the left Surprisingly the patient also had venous reflux studies that were really unremarkable. This included no evidence of DVTs or SVTs but there was no evidence of deep venous insufficiency or superficial venous insufficiency in the greater saphenous or short saphenous veins bilaterally. I would wonder about more central venous issues or perhaps this is all lymphedema 01/25/2021 upon evaluation today patient appears to be doing decently well and guard to her wound. The good news is the Iodoflex that is job she saw Dr. Dellia Nims last week for readmission and to be honest I think that this has done extremely well over that week. I do believe that she can tolerate the sharp debridement today which will be good. I think that cleaning off the wound will likely allow [place] to be able to use a different type of dressing I do not think the Iodoflex will even be necessary based on how clean the wound looks. Fortunately there is no sign of active infection at this time which is great news. No fevers, chills, nausea, vomiting, or diarrhea. MAKHAYLA, MCMURRY ([id number]) 02/12/2021 upon  evaluation today patient appears to be doing well with regard to her leg ulcer. We did switch to alginate when she came for nurse visit I think is doing much better for her. Fortunately there does not appear to be any signs of active infection at this time which is great news. No fevers, chills, nausea, vomiting, or diarrhea. 02/18/2021 upon evaluation today patient's wound is actually showing signs of good improvement. I am very pleased with where things stand currently. I do not see any signs of active infection which is great news and overall I feel like she is making good progress. The patient likewise is happy that things are doing so well and that this is measuring somewhat smaller. 02/25/2021 upon evaluation today patient actually appears to be doing quite well in regard to her wounds. She has been tolerating the dressing changes without complication. Fortunately there does  not appear to be any signs of active infection which is great news and overall I am extremely pleased with where things stand. No fevers, chills, nausea, vomiting, or diarrhea. She does have a lot of edema I really do think she needs compression socks to be worn in order to prevent things from causing additional issues for her currently. Especially on the right leg she should be wearing compression socks all the time to be honest. 03/04/2021 upon evaluation today patient's leg ulcer actually appears to be doing pretty well which is great news. There does not appear to be any significant change but overall the appearance is better even though the size is not necessarily reflecting a great improvement. Fortunately there does not appear to be any signs of active infection. No fevers, chills, nausea, vomiting, or diarrhea. 03/12/2021 upon evaluation today patient appears to be doing well with regard to her wounds. She has been tolerating the dressing changes without complication. The good news is that the wound on her left lateral  leg is doing much better and is showing signs of good improvement. Overall her swelling is controlled well as well. She does need some compression socks she plans to go Jefferey socks next week. 03/18/2021 on evaluation today patient's wound actually is showing signs of good improvement. She does have a little bit of slough this can require some sharp debridement today. 03/25/2021 upon evaluation today patient appears to be doing well with regard to her wound on the left lateral leg. She has been tolerating the dressing changes without complication. Fortunately there does not appear to be any signs of active infection at this time. No fever chills no 04/01/2021 upon evaluation today patient's wound is showing signs of improvement and overall very pleased with where things stand at this point. There is no evidence of active infection which is great news as well and in general I think that she is making great progress. 04/09/2021 upon evaluation today patient appears to be doing well with regard to her ankle ulcer. She is making good progress currently which is great news. There does not appear to be any signs of infection also excellent news. In general I am extremely pleased with where things stand today. No fevers, chills, nausea, vomiting, or diarrhea. 04/23/2021 upon inspection today patient appears to be doing quite well with regard to her leg ulcer. She has been tolerating the dressing changes without complication. She is can require some sharp debridement today but overall seems to be doing excellent. 05/06/2021 upon evaluation today patient unfortunately appears to be doing poorly overall in regard to her swelling. She is actually significantly more swollen than last week despite the fact that her wound is doing better this is not a good trend. I think that she probably needs to see her cardiologist ASAP and I discussed that with her today. This includes the fact that honestly I think she probably is  getting need an increase in her diuretic or something to try to clear away some of the excess fluid that she is experiencing at the moment. She is not been doing her pumps even twice a day but again right now more concerned that if she were to do the pump she would fluid overload her lungs causing other issues as far as her breathing is concerned. Obviously I think she needs to contact them and move up the appointment for the 12th of something sooner 2 weeks is a bit too long to wait with how swollen she  has. 05/14/2021 upon evaluation today patient's wound actually showing signs of being a little bit smaller compared to previous. She has been making good progress however. Fortunately there does not appear to be any evidence of active infection which is great. Overall I am extremely pleased in that regard. Nonetheless I am still concerned that she is having quite a bit of issue as far as the swelling is concerned she has been placed on fluid restriction as well as an increase in the furosemide by her doctor. We will see how things progress. 05/21/2021 upon evaluation today patient appears to be doing well with regard to her wound in general. She has been tolerating the dressing changes without complication. With that being said she has been using silver cell for a while and while this was showing signs of improvement it is now gotten to the point where this has slowed down quite significantly. I do believe that switching to a different dressing may be beneficial for her today. 05/28/2021 upon evaluation today patient actually appears to be doing quite well in regard to her wound. In fact there is really not any need for sharp debridement today based on what I see. The Hydrofera Blue is doing great and overall I think that she is managing quite nicely with the compression wrapping. Her edema is still down quite a bit with the wrapping in place. 06/11/2021 upon evaluation today patient appears to be doing  well with regard to her leg ulcer. She has been tolerating the dressing changes without complication. Fortunately there does not appear to be any signs of active infection at this time which is great news. No fevers, chills, nausea, vomiting, or diarrhea. She is going require some sharp debridement today. 10/11; left leg ulcer much better looking and with improved surface area. She is using Hydrofera Blue under 3 layer compression. She does not have any stocking on the right leg which in itself has very significant nonpitting edema 06/25/2021 upon evaluation today patient appears to be doing well with regard to the wound on her left leg. She has been tolerating the dressing changes without complication and overall I am extremely pleased with where things stand today. I do not see any evidence of infection which is great news. 07/02/2021 upon evaluation today patient's wound actually showing signs of excellent improvement and actually very pleased with where we stand today and the overall appearance. Fortunately there does not appear to be any signs of active infection. No fevers, chills, nausea, vomiting, or diarrhea. 07/09/2021 upon evaluation today patient appears to be doing better in regard to her wound this is measuring smaller and she is headed in the appropriate direction based on what I am seeing currently. I do not see any evidence of active infection systemically which is great news. 07/16/2021 upon evaluation today patient appears to be doing pretty well currently in regard to her leg ulcer. This is measuring smaller and looking much better this is great news. Fortunately there does not appear to be any evidence of active infection systemically which is great news DIANN, BANGERTER ([id number]) as well. 07/23/2021 upon evaluation today patient's wound is actually showing signs of good improvement this is definitely measuring smaller. I do not see any signs of infection which is great news  and overall very pleased in that regard. Overall I think that she is doing well with the [facility]. 07/30/2020 upon evaluation today patient appears to be doing well in regard to her wound. She has been tolerating  the dressing changes without complication think the Hydrofera Blue at this point is actually causing her to dry out a lot as far as the wound bed is concerned. Subsequently I think that we need to try to see what we can do to improve this. Fortunately there does not appear to be any evidence of active infection at this time which is great news. 08/06/2021 upon evaluation today patient appears to be doing well with regards to her wound. Is not measuring significantly smaller initial inspection but upon closer inspection she has a lot of new skin growth across the central portion of the wound. I think will get very close to complete resolution. 08/13/2021 upon evaluation today patient appears to be doing well with regard to her wound. In fact this appears to be I believe healed although there is still some question as to whether it is completely so. I am concerned about the fact that to be honest she still has some evidence of dry skin that could be just trapping something underneath I still want to debride anything as I am afraid of causing some damage to the good skin for that reason I Georgina Peer probably monitor for 1 more week before completely closing everything out. 08/20/2021 upon evaluation today patient actually appears to be doing excellent. In fact she appears to be completely healed. This was the case last week as well we just wanted to make sure nothing reopened since she does not really have an ability to put on compression socks this is good to be something we need to make sure was well-healed. Nonetheless I think we have achieved that goal as of today. 12/27; this is a patient we discharged 2 weeks ago with wounds on her left lower leg laterally. She was supposed to get  stockings and really did not get them. She developed blistering and reopening probably sometime late last week. She has 3 areas in the same area as previously. The mid calf dimensions on the left went from 39 to 51 cm today. She also has very significant edema on the right leg but as far as I am aware has not had wounds in this area 1/4; patient presents for follow-up. She has tolerated the compression wrap well. She has no issues or complaints today. 09/17/2021 upon evaluation today patient appears to be doing well with regard to her wounds. She has been tolerating the dressing changes without complication. Fortunately there does not appear to be any signs of active infection at this time. No fevers, chills, nausea, vomiting, or diarrhea. 10/01/2021 upon evaluation today patient appears to be doing well with regard to her wound this is actually measuring better and looking smaller and I am very pleased in that regard. Fortunately I do not see any evidence of active infection locally nor systemically at this point. No fevers, chills, nausea, vomiting, or diarrhea. 10/08/2021 upon evaluation patient's wounds are actually showing some signs of improvement measuring a little bit smaller and looking a little bit better. Overall I do not think there is any need for sharp debridement today which is good news. No fevers, chills, nausea, vomiting, or diarrhea. 10/14/2021 upon evaluation today patient appears to be doing well with regard to her wound. She has been tolerating the dressing changes without complication and overall I am extremely pleased with where things stand today. There is a little bit of film on the surface of the wounds but this was carefully cleaned away with just saline and gauze she really did  not want me to do any sharp debridement today. 10/29/2021 upon evaluation today patient appears to be doing quite well in regard to her wounds. She is actually showing signs of excellent improvement  which is great news and overall I feel like we are on the right track at this time. She does have a wound on both legs and she does need something compression wise when she heals for that reason we will get a go and see about ordering her bilateral juxta lite compression wraps. 11/05/2021 upon evaluation today patient appears to be doing well with regard to her wound. She has been tolerating the dressing changes without complication. Fortunately I do not see any evidence of active infection locally nor systemically at this time which is great news. No fevers, chills, nausea, vomiting, or diarrhea. 11/12/2021 upon evaluation today patient appears to be doing well with regard to her wounds. She is doing excellent on the left on the right this is not doing quite as well there is some slough and biofilm buildup. 3/14; patient presents for follow-up. She has no issues or complaints today. She has tolerated the compression wraps well. 11/26/2021 upon evaluation today patient appears to be doing well with regard to her wounds. Both are showing signs of good improvement which is great news. I do not see any evidence of active infection and overall think she is healing quite nicely. 12/03/2021 upon evaluation today patient appears to be doing well with regard to her wounds. Both are showing signs of excellent improvement I am very pleased with where things stand and I think that she is making good progress here. Fortunately I do not see any signs of active infection locally or systemically which is great news. No fevers, chills, nausea, vomiting, or diarrhea. 12-10-2021 upon evaluation today patient's wounds actually appear to be doing awesome. I am extremely pleased with where we stand I think she is making excellent progress. I do not see any signs of active infection locally or systemically at this time. 12-17-2021 upon evaluation today patient's wounds are actually showing signs of good improvement. Fortunately I  do not see any evidence of active infection locally nor systemically which is great news. Overall I do not believe that she is showing any signs of significant worsening which is good news. With that being said I do believe that she has a little bit of film on the surface of the wound I was actually able to clear this away with saline and gauze no sharp debridement was necessary. 12-24-2021 upon evaluation today patient appears to be doing well with regard to her wounds. I do not see any signs of active infection currently which is great news and overall I think that we are on the right track here. No fevers, chills, nausea, vomiting, or diarrhea. 12-31-2021 upon evaluation today patient appears to be doing well with regard to her wounds. Both are showing signs of improvement the wound BEUMER, Mardella ([id number]) on the right leg is going require some sharp debridement on the left seems to be doing quite well. 01-07-2022 upon evaluation today patient appears to be doing well currently in regard to her wounds. She is actually making good progress bilaterally. Both of them require little bit of sharp debridement but not too much which is great news. Upon inspection patient's wound bed actually showed signs of good granulation and epithelization on both legs. She still is having significant issues with the right leg compared to the left although I do feel  like both are actually showing signs of pretty good improvement. 01-21-2022 upon evaluation today patient's wound is actually showing signs of excellent improvement I am very pleased with where we stand currently. I do not see any signs of active infection locally or systemically which is great news. No fevers, chills, nausea, vomiting, or diarrhea. 01-28-2022 upon evaluation today patient appears to be doing well with regard to her wound on the left leg this is pretty much just about healed which is great news. Unfortunately in regard to the wound on her  right leg this is not doing nearly as well in fact I think we probably need to see about a culture today that was discussed with her. 02-04-2022 upon evaluation today patient appears to be doing well with regard to her left leg which in fact might actually be completely healed I am not 100% sure. Nonetheless this is shown signs of excellent improvement which is great news. With that being said in regards to the right leg there is some slough and film buildup on the surface of the wound although she is feeling much better than last week this does appear to be doing much better than it was last week. Fortunately there does not appear to be any signs of active infection locally or systemically at this time. 02-11-2022 upon evaluation today patient's wounds appear to be doing decently well. In fact the left leg is healed although there is brand-new skin were definitely given still need to wrap her for the time being. On the right leg this is showing signs of some epithelial growth in the middle of the wound it still measures the same because there is still a larger area with speckled openings but again as far as the entire area being open this is significantly improved which is great news. I am very pleased. 6/13; left leg remains healed and she has a juxta light to apply to it today. There is some concern about whether she is going to be able to do this at home. She lives with a daughter although she has her own disability. On the right the wound looks smaller to me nice epithelialization good edema control. 02-25-2022 upon evaluation today patient appears to be doing well with regard to her legs the left leg is still healed the right leg is doing much better. I think we are on the right track here. With that being said unfortunately she continues to have significant issues with some other problems and she actually tells me on Saturday she was having lunch and went inside to sit down when she tells me she  had all of a sudden a sharp sensation that went from her hand through her arm into her leg and her neck and she tells me that she could not see anything for the next hour it was completely black. Slowly after this her vision started to return and has been normal since. It sounds to me as if she may have had a TIA or mini stroke at this point. Nonetheless I think that she needs to have this checked out ASAP. Her primary care provider is Dr. Clide Deutscher at [facility]. We will get a try to get in touch with him. 03-10-2022 upon evaluation today patient's wound actually showed signs of good improvement. She seems to be making excellent progress here. I do not see any evidence of active infection locally or systemically which is great news. No fevers, chills, nausea, vomiting, or diarrhea. She was in the  hospital since have seen her last she ended up finding out that she was having syncopal episodes. That is what was going on with the blacking out and losing vision. Subsequently they adjusted some of her medication she seems to be doing better. 03-18-2022 upon evaluation today patient's wound is actually showing signs of good granulation and epithelization at this point. Fortunately I do not see any signs of active infection locally or systemically which is great news. 03-25-2022 any evidence of active infection locally or systemically which is great news. Any evidence of active infection which is great news and overall the wound is looking better. Upon evaluation today patient appears to be doing well with regard to her leg ulcer. She has been tolerating the dressing changes without complication. Fortunately I do not see 7/25; the patient's wound is on the right lateral lower extremity using silver alginate 3 layer compression. She has chronic venous insufficiency and lymphedema. We are using a juxta lite on the left leg which does not have any open wounds but she is leaving this on all week it seems that there  just is not a way to get this changed at home 04-08-2022 upon evaluation today patient appears to be doing well currently in regard to her wound. She has been tolerating the dressing changes without complication. Fortunately I do not see any evidence of active infection locally or systemically which is great news. No fever or chills noted her left leg has reopened at this point. 04-15-2022 upon evaluation patient's wounds actually are showing signs of improvement which is great news. Little by little I do believe her making progress here. In regard to the right leg this is looking better though the measurement does not speak to it there is a lot of new skin growth. On the left leg this is significantly smaller compared to last week most of the blistered area has completely closed. 04-22-2022 upon evaluation today patient appears to be doing excellent in regard to her wounds both are showing signs of improvement. Her knees and above are still extremely swollen the legs were rewrapped them are down significantly. With that being said I do not think that we will get a be able to keep them that way once were not rapid noted that she really has no way to be able to put juxta lites on and off and nobody to help her with that. That is the biggest concern that we have here at this point. Fortunately I do not think that there is any evidence of infection which is great news. 04-29-2022 upon evaluation today patient appears to be doing well with regard to her wounds. She has been tolerating the dressing changes without complication. Fortunately there does not appear to be any evidence of infection locally or systemically which is great news and overall I am extremely pleased in that regard. Left leg is healed the right leg is very close we discussed today the possibility of her granddaughter helping with putting on and off compression wraps for her. 05-06-2022 upon evaluation today patient appears to be doing well  currently in regard to her left leg which is showing signs of being completely healed. With regard to her right leg she still has an open area though this is doing much better there is a lot less drainage than what we previously noted. Fortunately I see no evidence of active infection locally or systemically at this time which is great news. 05-20-2022 upon evaluation today patient appears to be  doing excellent in regard to her leg. The left leg is still healed the right leg is much better. Fortunately I see no signs of infection locally or systemically at this time which is great news. No fevers, chills, nausea, vomiting, or diarrhea. 05-27-2022 upon evaluation today patient appears to be doing excellent in regard to her wound this is actually measuring smaller and looking SHAWNTIA, MANGAL ([id number]) much better. Fortunately I see no signs of active infection locally or systemically at this time. 06-03-2022 upon evaluation patient's wound actually is measuring smaller and looking much better. She still has a lot of discomfort but fortunately nothing to significant at this point which is great news. No fevers, chills, nausea, vomiting, or diarrhea. I think we are making some pretty good progress here. 06-10-2022 upon evaluation today patient appears to be doing well currently in regard to her wound which is actually measuring somewhat smaller. This is actually 2 separate wounds that are clustered and this is making it appear little bit larger than what it is but nonetheless we are seeing some definite improvement. Fortunately I do not see any signs of active infection at this time. No fevers, chills, nausea, vomiting, or diarrhea. 06-17-2022 upon evaluation today patient appears to be doing well currently in regard to her wound although she was in the hospital since she was last here. That was from 06-12-2022 through 06-16-2022 which was yesterday. This was due to a CHF exacerbation. With that being said  the patient has noted in her record review acute on chronic congestive heart failure with hypertensive emergency noted. She also has stage IIIa chronic kidney disease noted. With that being said I do think her wound is much better because I got a lot of fluid off to try to diurese her to a degree but her kidneys would not hold for that they put her on furosemide 40 mg and that is helping to get the fluid off she is much better but still has a lot of fluid buildup. Objective Constitutional Well-nourished and well-hydrated in no acute distress. Vitals Time Taken: 2:11 PM, Height: 69 in, Weight: 244 lbs, BMI: 36, Temperature: 98.3 F, Pulse: 62 bpm, Respiratory Rate: 18 breaths/min, Blood Pressure: 168/73 mmHg. Respiratory normal breathing without difficulty. Psychiatric this patient is able to make decisions and demonstrates good insight into disease process. Alert and Oriented x 3. pleasant and cooperative. General Notes: Upon inspection patient's wound bed actually showed signs of good granulation and epithelization at this point again I am very pleased with what I am seeing no sharp debridement was necessary today. Integumentary (Hair, Skin) Wound #4 status is Open. Original cause of wound was Gradually Appeared. The date acquired was: 10/27/2021. The wound has been in treatment 33 weeks. The wound is located on the Right,Lateral Lower Leg. The wound measures 3cm length x 2.5cm width x 0.1cm depth; 5.89cm^2 area and 0.589cm^3 volume. There is Fat Layer (Subcutaneous Tissue) exposed. There is no tunneling or undermining noted. There is a medium amount of serosanguineous drainage noted. There is medium (34-66%) pink granulation within the wound bed. There is a medium (34-66%) amount of necrotic tissue within the wound bed including Adherent Slough. 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 There  was a Three Layer Compression Therapy Procedure by Carlene Coria, RN. Post procedure Diagnosis Wound #4: Same as Pre-Procedure JOSSLYN, CIOLEK ([id number]) Plan Follow-up Appointments: Return Appointment in 1 week. Nurse Visit as needed Bathing/ Shower/ Hygiene: May shower with wound dressing protected with water repellent cover or cast protector. No tub bath. Anesthetic (Use 'Patient Medications' Section for Anesthetic Order Entry): Lidocaine applied to wound bed Edema Control - Lymphedema / Segmental Compressive Device / Other: Optional: One layer of unna paste to top of compression wrap (to act as an anchor). 3 Layer Compression System for Lymphedema. - left Elevate, Exercise Daily and Avoid Standing for Long Periods of Time. Elevate legs to the level of the heart and pump ankles as often as possible Elevate leg(s) parallel to the floor when sitting. Compression Pump: Use compression pump on left lower extremity for 60 minutes, twice daily. - You may pump over wraps Compression Pump: Use compression pump on right lower extremity for 60 minutes, twice daily. - You may pump over wraps Additional Orders / Instructions: Follow Nutritious Diet and Increase Protein Intake WOUND #4: - Lower Leg Wound Laterality: Right, Lateral Cleanser: Soap and Water 1 x Per Week/30 Days Discharge Instructions: Gently cleanse wound with antibacterial soap, rinse and pat dry prior to dressing wounds Cleanser: Wound Cleanser 1 x Per Week/30 Days Discharge Instructions: Wash your hands with soap and water. Remove old dressing, discard into plastic bag and place into trash. Cleanse the wound with Wound Cleanser prior to applying a clean dressing using gauze sponges, not tissues or  cotton balls. Do not scrub or use excessive force. Pat dry using gauze sponges, not tissue or cotton balls. Primary Dressing: Silvercel Small 2x2 (in/in) 1 x Per Week/30 Days Discharge Instructions: Apply Silvercel Small 2x2 (in/in) as instructed Secondary Dressing: ABD Pad 5x9 (in/in) 1 x Per Week/30 Days Discharge Instructions: Cover with ABD pad Compression Wrap: 3-LAYER WRAP - Profore Lite LF 3 Multilayer Compression Bandaging System 1 x Per Week/30 Days Discharge Instructions: Apply 3 multi-layer wrap as prescribed. 1. I am going to suggest that we have the patient continue to monitor for any signs of worsening or infection. Obviously I think that we are on the right track here and I am hopeful that she will continue to show signs of improvement and that no be able to get things stable as far as her fluid retention and congestive heart failure is concerned. 2. I am also can recommend that we have the patient continue with the dressings as before using silver cell, an ABD pad, and a 3 layer compression wrap. 3. I would also suggest that she continue to try to elevate her legs much as possible I think this is still of utmost importance. We will see patient back for reevaluation in 1 week here in the clinic. If anything worsens or changes patient will contact our office for additional recommendations. Electronic Signature(s) Signed: 06/17/2022 2:47:45 PM By: Worthy Keeler PA-C Entered By: Worthy Keeler on 06/17/2022 14:47:45 [name] ([id number]) -------------------------------------------------------------------------------- SuperBill Details Patient Name: [name] Date of Service: 06/17/2022 Medical Record Number: [id number] Patient Account Number: [id number] Date of Birth/Sex: 04-26-1946 (76 y.o. F) Treating RN: Carlene Coria Primary Care Provider: [name] Other Clinician: Referring Provider: [name] Treating Provider/Extender: Skipper Cliche in  Treatment: 73 Diagnosis Coding ICD-10 Codes Code Description I89.0 Lymphedema, not elsewhere classified I87.332 Chronic venous hypertension (idiopathic) with ulcer and inflammation of left lower extremity L97.812 Non-pressure chronic ulcer of other part of  right lower leg with fat layer exposed Facility Procedures CPT4: Description Modifier Quantity Code [id number] BILATERAL: Application of multi-layer venous compression system; leg (below knee), including 1 ankle and foot. 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

## 2022-06-17 NOTE — Progress Notes (Addendum)
ELEENA, GRATER ([id number]) Visit Report for 06/17/2022 Arrival Information Details Patient Name: [name], [name] Date of Service: 06/17/2022 2:00 PM Medical Record Number: [id number] Patient Account Number: [id number] Date of Birth/Sex: 1946-06-30 (76 y.o. F) Treating RN: Carlene Coria Primary Care Annise Boran: [name] Other Clinician: Referring Alyannah Sanks: [name] Treating [place] Cantero/Extender: Skipper Cliche in Treatment: 40 Visit Information History Since Last Visit All ordered tests and consults were completed: No Patient Arrived: Wheel Chair Added or deleted any medications: No Arrival Time: 14:05 Any new allergies or adverse reactions: No Accompanied By: self Had a fall or experienced change in No Transfer Assistance: None activities of daily living that may affect Patient Identification Verified: Yes risk of falls: Secondary Verification Process Completed: Yes Signs or symptoms of abuse/neglect since last visito No Patient Requires Transmission-Based Precautions: No Hospitalized since last visit: No Patient Has Alerts: Yes Implantable device outside of the clinic excluding No Patient Alerts: NOT DIABETIC cellular tissue based products placed in the center since last visit: Has Dressing in Place as Prescribed: Yes Has Compression in Place as Prescribed: Yes Pain Present Now: No Electronic Signature(s) Signed: 06/20/2022 12:25:01 PM By: Carlene Coria RN Entered By: Carlene Coria on 06/17/2022 14:11:47 [name] ([id number]) -------------------------------------------------------------------------------- Clinic Level of Care Assessment Details Patient Name: [name] Date of Service: 06/17/2022 2:00 PM Medical Record Number: [id number] Patient Account Number: [id number] Date of Birth/Sex: 03/19/1946 (76 y.o. F) Treating RN: Carlene Coria Primary Care Simcha Farrington: [name] Other Clinician: Referring Arien Morine: [name] Treating  Madina Galati/Extender: Skipper Cliche in Treatment: 73 Clinic Level of Care Assessment Items TOOL 1 Quantity Score '[]'$  - Use when EandM and Procedure is performed on INITIAL visit 0 ASSESSMENTS - Nursing Assessment / Reassessment '[]'$  - General Physical Exam (combine w/ comprehensive assessment (listed just below) when performed on new 0 pt. evals) '[]'$  - 0 Comprehensive Assessment (HX, ROS, Risk Assessments, Wounds Hx, etc.) ASSESSMENTS - Wound and Skin Assessment / Reassessment '[]'$  - Dermatologic / Skin Assessment (not related to wound area) 0 ASSESSMENTS - Ostomy and/or Continence Assessment and Care '[]'$  - Incontinence Assessment and Management 0 '[]'$  - 0 Ostomy Care Assessment and Management (repouching, etc.) PROCESS - Coordination of Care '[]'$  - Simple Patient / Family Education for ongoing care 0 '[]'$  - 0 Complex (extensive) Patient / Family Education for ongoing care '[]'$  - 0 Staff obtains Programmer, systems, Records, Test Results / Process Orders '[]'$  - 0 Staff telephones HHA, Nursing Homes / Clarify orders / etc '[]'$  - 0 Routine Transfer to another Facility (non-emergent condition) '[]'$  - 0 Routine Hospital Admission (non-emergent condition) '[]'$  - 0 New Admissions / Biomedical engineer / Ordering NPWT, Apligraf, etc. '[]'$  - 0 Emergency Hospital Admission (emergent condition) PROCESS - Special Needs '[]'$  - Pediatric / Minor Patient Management 0 '[]'$  - 0 Isolation Patient Management '[]'$  - 0 Hearing / Language / Visual special needs '[]'$  - 0 Assessment of Community assistance (transportation, D/C planning, etc.) '[]'$  - 0 Additional assistance / Altered mentation '[]'$  - 0 Support Surface(s) Assessment (bed, cushion, seat, etc.) INTERVENTIONS - Miscellaneous '[]'$  - External ear exam 0 '[]'$  - 0 Patient Transfer (multiple staff / Civil Service fast streamer / Similar devices) '[]'$  - 0 Simple Staple / Suture removal (25 or less) '[]'$  - 0 Complex Staple / Suture removal (26 or more) '[]'$  - 0 Hypo/Hyperglycemic Management (do not  check if billed separately) '[]'$  - 0 Ankle / Brachial Index (ABI) - do not check if billed separately Has the patient been seen at the hospital within the  last three years: Yes Total Score: 0 Level Of Care: ____ [name] ([id number]) Electronic Signature(s) Signed: 06/20/2022 12:25:01 PM By: Carlene Coria RN Entered By: Carlene Coria on 06/17/2022 14:42:51 [name] ([id number]) -------------------------------------------------------------------------------- Compression Therapy Details Patient Name: [name] Date of Service: 06/17/2022 2:00 PM Medical Record Number: [id number] Patient Account Number: [id number] Date of Birth/Sex: 10-Sep-1945 (76 y.o. F) Treating RN: Carlene Coria Primary Care Izabela Ow: [name] Other Clinician: Referring Refugio Vandevoorde: [name] Treating Kourtlyn Charlet/Extender: Skipper Cliche in Treatment: 73 Compression Therapy Performed for Wound Assessment: Wound #4 Right,Lateral Lower Leg Performed By: Clinician Carlene Coria, RN Compression Type: Three Layer Post Procedure Diagnosis Same as Pre-procedure Electronic Signature(s) Signed: 06/20/2022 12:25:01 PM By: Carlene Coria RN Entered By: Carlene Coria on 06/17/2022 14:42:24 [name] ([id number]) -------------------------------------------------------------------------------- Encounter Discharge Information Details Patient Name: [name] Date of Service: 06/17/2022 2:00 PM Medical Record Number: [id number] Patient Account Number: [id number] Date of Birth/Sex: 11-29-1945 (76 y.o. F) Treating RN: Carlene Coria Primary Care Athalee Esterline: [name] Other Clinician: Referring Audria Takeshita: [name] Treating Floris Neuhaus/Extender: Skipper Cliche in Treatment: 26 Encounter Discharge Information Items Discharge Condition: Stable Ambulatory Status: Wheelchair Discharge Destination: Home Transportation: Private Auto Accompanied By: self Schedule Follow-up Appointment:  Yes Clinical Summary of Care: Electronic Signature(s) Signed: 06/20/2022 12:25:01 PM By: Carlene Coria RN Entered By: Carlene Coria on 06/17/2022 14:44:15 [name] ([id number]) -------------------------------------------------------------------------------- Lower Extremity Assessment Details Patient Name: [name] Date of Service: 06/17/2022 2:00 PM Medical Record Number: [id number] Patient Account Number: [id number] Date of Birth/Sex: [birth year] (76 y.o. F) Treating RN: Carlene Coria Primary Care Jeffrey Graefe: [name] Other Clinician: Referring Lateia Fraser: [name] Treating Nyajah Hyson/Extender: Skipper Cliche in Treatment: 73 Vascular Assessment Pulses: Dorsalis Pedis Palpable: [Left:Yes] [Right:Yes] Electronic Signature(s) Signed: 06/20/2022 12:25:01 PM By: Carlene Coria RN Entered By: Carlene Coria on 06/17/2022 14:23:24 [name] ([id number]) -------------------------------------------------------------------------------- Multi Wound Chart Details Patient Name: [name] Date of Service: 06/17/2022 2:00 PM Medical Record Number: [id number] Patient Account Number: [id number] Date of Birth/Sex: 1946/06/06 (76 y.o. F) Treating RN: Carlene Coria Primary Care Cavin Longman: [name] Other Clinician: Referring Yahel Fuston: [name] Treating Abie Killian/Extender: Skipper Cliche in Treatment: 73 Vital Signs Height(in): 69 Pulse(bpm): 7 Weight(lbs): 244 Blood Pressure(mmHg): 168/73 Body Mass Index(BMI): 36 Temperature(F): 98.3 Respiratory Rate(breaths/min): 18 Photos: [N/A:N/A] Wound Location: Right, Lateral Lower Leg N/A N/A Wounding Event: Gradually Appeared N/A N/A Primary Etiology: Venous Leg Ulcer N/A N/A Comorbid History: Cataracts, Lymphedema, Sleep N/A N/A Apnea, Congestive Heart Failure, Hypertension, Osteoarthritis, Neuropathy, Received Chemotherapy, Received Radiation Date Acquired: 10/27/2021 N/A N/A Weeks of Treatment: 33 N/A  N/A Wound Status: Open N/A N/A Wound Recurrence: No N/A N/A Measurements L x W x D (cm) 3x2.5x0.1 N/A N/A Area (cm) : 5.89 N/A N/A Volume (cm) : 0.589 N/A N/A % Reduction in Area: -400.00% N/A N/A % Reduction in Volume: -399.20% N/A N/A Classification: Full Thickness Without Exposed N/A N/A Support Structures Exudate Amount: Medium N/A N/A Exudate Type: Serosanguineous N/A N/A Exudate Color: red, brown N/A N/A Granulation Amount: Medium (34-66%) N/A N/A Granulation Quality: Pink N/A N/A Necrotic Amount: Medium (34-66%) N/A N/A Exposed Structures: Fat Layer (Subcutaneous Tissue): N/A N/A Yes Fascia: No Tendon: No Muscle: No Joint: No Bone: No Epithelialization: None N/A N/A Treatment Notes Electronic Signature(s) Signed: 06/20/2022 12:25:01 PM By: Carlene Coria RN Entered By: Carlene Coria on 06/17/2022 14:23:51 [name], [name] ([id number]) [name], [name] ([id number]) -------------------------------------------------------------------------------- Multi-Disciplinary Care Plan Details Patient Name: [name] Date of Service: 06/17/2022 2:00 PM Medical Record Number: [id number] Patient Account Number: [id number]  Date of Birth/Sex: [birth year] (76 y.o. F) Treating RN: Carlene Coria Primary Care Silvio Sausedo: [name] Other Clinician: Referring Aman Bonet: [name] Treating Liliane Mallis/Extender: Skipper Cliche in Treatment: 55 Active Inactive Electronic Signature(s) Signed: 06/20/2022 12:25:01 PM By: Carlene Coria RN Entered By: Carlene Coria on 06/17/2022 14:23:42 [name] ([id number]) -------------------------------------------------------------------------------- Pain Assessment Details Patient Name: [name] Date of Service: 06/17/2022 2:00 PM Medical Record Number: [id number] Patient Account Number: [id number] Date of Birth/Sex: [birth year] (76 y.o. F) Treating RN: Carlene Coria Primary Care Isami Mehra: [name] Other Clinician: Referring  Addisyn Leclaire: [name] Treating Valina Maes/Extender: Skipper Cliche in Treatment: 73 Active Problems Location of Pain Severity and Description of Pain Patient Has Paino No Site Locations Pain Management and Medication Current Pain Management: Electronic Signature(s) Signed: 06/20/2022 12:25:01 PM By: Carlene Coria RN Entered By: Carlene Coria on 06/17/2022 14:12:39 [name] ([id number]) -------------------------------------------------------------------------------- Patient/Caregiver Education Details Patient Name: [name] Date of Service: 06/17/2022 2:00 PM Medical Record Number: [id number] Patient Account Number: [id number] Date of Birth/Gender: September 30, 1945 (76 y.o. F) Treating RN: Carlene Coria Primary Care Physician: [name] Other Clinician: Referring Physician: [name] Treating Physician/Extender: Skipper Cliche in Treatment: 60 Education Assessment Education Provided To: Patient Education Topics Provided Wound/Skin Impairment: Methods: Explain/Verbal Responses: State content correctly Electronic Signature(s) Signed: 06/20/2022 12:25:01 PM By: Carlene Coria RN Entered By: Carlene Coria on 06/17/2022 14:43:31 [name] ([id number]) -------------------------------------------------------------------------------- Wound Assessment Details Patient Name: [name] Date of Service: 06/17/2022 2:00 PM Medical Record Number: [id number] Patient Account Number: [id number] Date of Birth/Sex: 1946-04-12 (76 y.o. F) Treating RN: Carlene Coria Primary Care [place] Craigo: [name] Other Clinician: Referring Radin Raptis: [name] Treating Zitlaly Malson/Extender: Skipper Cliche in Treatment: 73 Wound Status Wound Number: 4 Primary Venous Leg Ulcer Etiology: Wound Location: Right, Lateral Lower Leg Wound Open Wounding Event: Gradually Appeared Status: Date Acquired: 10/27/2021 Comorbid Cataracts, Lymphedema, Sleep Apnea, Congestive Heart Weeks  Of Treatment: 33 History: Failure, Hypertension, Osteoarthritis, Neuropathy, Clustered Wound: No Received Chemotherapy, Received Radiation Photos Wound Measurements Length: (cm) 3 Width: (cm) 2.5 Depth: (cm) 0.1 Area: (cm) 5.89 Volume: (cm) 0.589 % Reduction in Area: -400% % Reduction in Volume: -399.2% Epithelialization: None Tunneling: No Undermining: No Wound Description Classification: Full Thickness Without Exposed Support Structures Exudate Amount: Medium Exudate Type: Serosanguineous Exudate Color: red, brown Foul Odor After Cleansing: No Slough/Fibrino Yes Wound Bed Granulation Amount: Medium (34-66%) Exposed Structure Granulation Quality: Pink Fascia Exposed: No Necrotic Amount: Medium (34-66%) Fat Layer (Subcutaneous Tissue) Exposed: Yes Necrotic Quality: Adherent Slough Tendon Exposed: No Muscle Exposed: No Joint Exposed: No Bone Exposed: No Treatment Notes Wound #4 (Lower Leg) Wound Laterality: Right, Lateral Cleanser Soap and Water Discharge Instruction: Gently cleanse wound with antibacterial soap, rinse and pat dry prior to dressing wounds Wound Cleanser [name], [name] ([id number]) Discharge Instruction: Wash your hands with soap and water. Remove old dressing, discard into plastic bag and place into trash. Cleanse the wound with Wound Cleanser prior to applying a clean dressing using gauze sponges, not tissues or cotton balls. Do not scrub or use excessive force. Pat dry using gauze sponges, not tissue or cotton balls. Peri-Wound Care Topical Primary Dressing Silvercel Small 2x2 (in/in) Discharge Instruction: Apply Silvercel Small 2x2 (in/in) as instructed Secondary Dressing ABD Pad 5x9 (in/in) Discharge Instruction: Cover with ABD pad Secured With Compression Wrap 3-LAYER WRAP - Profore Lite LF 3 Multilayer Compression Bandaging System Discharge Instruction: Apply 3 multi-layer wrap as prescribed. Compression Stockings Add-Ons Electronic  Signature(s) Signed: 06/20/2022 12:25:01 PM By: Carlene Coria RN Entered By:  Carlene Coria on 06/17/2022 14:21:59 [name], [name] ([id number]) -------------------------------------------------------------------------------- [place] Details Patient Name: [name], [name] Date of Service: 06/17/2022 2:00 PM Medical Record Number: [id number] Patient Account Number: [id number] Date of Birth/Sex: 05-02-1946 (76 y.o. F) Treating RN: Carlene Coria Primary Care Kobi Aller: [name] Other Clinician: Referring Nautia Lem: [name] Treating Janila Arrazola/Extender: Skipper Cliche in Treatment: 73 Vital Signs Time Taken: 14:11 Temperature (F): 98.3 Height (in): 69 Pulse (bpm): 62 Weight (lbs): 244 Respiratory Rate (breaths/min): 18 Body Mass Index (BMI): 36 Blood Pressure (mmHg): 168/73 Reference Range: 80 - 120 mg / dl Electronic Signature(s) Signed: 06/20/2022 12:25:01 PM By: Carlene Coria RN Entered By: Carlene Coria on 06/17/2022 14:12:26

## 2022-06-23 ENCOUNTER — Ambulatory Visit: Payer: Medicare Other | Admitting: Family

## 2022-06-24 ENCOUNTER — Encounter: Payer: Medicare Other | Admitting: Physician Assistant

## 2022-06-24 DIAGNOSIS — L97812 Non-pressure chronic ulcer of other part of right lower leg with fat layer exposed: Secondary | ICD-10-CM | POA: Diagnosis not present

## 2022-06-24 NOTE — Progress Notes (Signed)
SHANERA, MESKE ([id number]) [id number] Page 1 of 9 Visit Report for 06/24/2022 Arrival Information Details Patient Name: Date of Service: [name] TRICE 06/24/2022 2:00 PM Medical Record Number: [id number] Patient Account Number: [id number] Date of Birth/Sex: Treating RN: 12-07-1945 (76 y.o. [name] Primary Care Domonique Cothran: [name] Other Clinician: Referring Simren Popson: Treating Marciel Offenberger/Extender: Mayer Masker, Ngwe Weeks in Treatment: 35 Visit Information History Since Last Visit All ordered tests and consults were completed: No Patient Arrived: Wheel Chair Added or deleted any medications: No Arrival Time: 14:28 Any new allergies or adverse reactions: No Accompanied By: self Had a fall or experienced change in No Transfer Assistance: None activities of daily living that may affect Patient Identification Verified: Yes risk of falls: Secondary Verification Process Completed: Yes Signs or symptoms of abuse/neglect since last visito No Patient Requires Transmission-Based Precautions: No Hospitalized since last visit: No Patient Has Alerts: Yes Implantable device outside of the clinic excluding No Patient Alerts: NOT DIABETIC cellular tissue based products placed in the center since last visit: Has Dressing in Place as Prescribed: Yes Has Compression in Place as Prescribed: Yes Pain Present Now: No Electronic Signature(s) Signed: 06/25/2022 8:53:50 AM By: Carlene Coria RN Entered By: Carlene Coria on 06/24/2022 14:36:17 -------------------------------------------------------------------------------- Clinic Level of Care Assessment Details Patient Name: Date of Service: [name] TRICE 06/24/2022 2:00 PM Medical Record Number: [id number] Patient Account Number: [id number] Date of Birth/Sex: Treating RN: 1946-05-22 (76 y.o. [name] Primary Care Sary Bogie: [name] Other Clinician: Referring Navea Woodrow: Treating  Roald Lukacs/Extender: Mayer Masker, Ngwe Weeks in Treatment: 87 Clinic Level of Care Assessment Items TOOL 1 Quantity Score '[]'$  - 0 Use when EandM and Procedure is performed on INITIAL visit ASSESSMENTS - Nursing Assessment / Reassessment '[]'$  - 0 General Physical Exam (combine w/ comprehensive assessment (listed just below) when performed on new pt. [address] Kiene, [place] ([id number]) [id number] Page 2 of 9 '[]'$  - 0 Comprehensive Assessment (HX, ROS, Risk Assessments, Wounds Hx, etc.) ASSESSMENTS - Wound and Skin Assessment / Reassessment '[]'$  - 0 Dermatologic / Skin Assessment (not related to wound area) ASSESSMENTS - Ostomy and/or Continence Assessment and Care '[]'$  - 0 Incontinence Assessment and Management '[]'$  - 0 Ostomy Care Assessment and Management (repouching, etc.) PROCESS - Coordination of Care '[]'$  - 0 Simple Patient / Family Education for ongoing care '[]'$  - 0 Complex (extensive) Patient / Family Education for ongoing care '[]'$  - 0 Staff obtains Programmer, systems, Records, T Results / Process Orders est '[]'$  - 0 Staff telephones HHA, Nursing Homes / Clarify orders / etc '[]'$  - 0 Routine Transfer to another Facility (non-emergent condition) '[]'$  - 0 Routine Hospital Admission (non-emergent condition) '[]'$  - 0 New Admissions / Biomedical engineer / Ordering NPWT Apligraf, etc. , '[]'$  - 0 Emergency Hospital Admission (emergent condition) PROCESS - Special Needs '[]'$  - 0 Pediatric / Minor Patient Management '[]'$  - 0 Isolation Patient Management '[]'$  - 0 Hearing / Language / Visual special needs '[]'$  - 0 Assessment of Community assistance (transportation, D/C planning, etc.) '[]'$  - 0 Additional assistance / Altered mentation '[]'$  - 0 Support Surface(s) Assessment (bed, cushion, seat, etc.) INTERVENTIONS - Miscellaneous '[]'$  - 0 External ear exam '[]'$  - 0 Patient Transfer (multiple staff / Civil Service fast streamer / Similar devices) '[]'$  - 0 Simple Staple / Suture removal (25 or  less) '[]'$  - 0 Complex Staple / Suture removal (26 or more) '[]'$  - 0 Hypo/Hyperglycemic Management (do not check if billed separately) '[]'$  - 0 Ankle / Brachial Index (  ABI) - do not check if billed separately Has the patient been seen at the hospital within the last three years: Yes Total Score: 0 Level Of Care: ____ Electronic Signature(s) Signed: 06/25/2022 8:53:50 AM By: Carlene Coria RN Entered By: Carlene Coria on 06/24/2022 15:53:45 -------------------------------------------------------------------------------- Compression Therapy Details Patient Name: Date of Service: [name] TRICE 06/24/2022 2:00 PM Medical Record Number: [id number] Patient Account Number: [id number] Date of Birth/Sex: Treating RN: Oct 22, 1945 (76 y.o. [name] Primary Care Mushka Laconte: [name] Other Clinician: SONIYA, ASHRAF ([id number]) [id number] Page 3 of 9 Referring Mariona Scholes: Treating Hagan Vanauken/Extender: Mayer Masker, Ngwe Weeks in Treatment: 74 Compression Therapy Performed for Wound Assessment: Wound #4 Right,Lateral Lower Leg Performed By: Clinician Carlene Coria, RN Compression Type: Three Layer Post Procedure Diagnosis Same as Pre-procedure Electronic Signature(s) Signed: 06/25/2022 8:53:50 AM By: Carlene Coria RN Entered By: Carlene Coria on 06/24/2022 15:17:55 -------------------------------------------------------------------------------- Compression Therapy Details Patient Name: Date of Service: [name] TRICE 06/24/2022 2:00 PM Medical Record Number: [id number] Patient Account Number: [id number] Date of Birth/Sex: Treating RN: 05/23/1946 (76 y.o. [name] Primary Care Rorik Vespa: [name] Other Clinician: Referring Jarnell Cordaro: Treating Bryahna Lesko/Extender: Mayer Masker, Ngwe Weeks in Treatment: 70 Compression Therapy Performed for Wound Assessment: NonWound Condition Lymphedema - Left Leg Performed By: Clinician Carlene Coria,  RN Compression Type: Three Layer Post Procedure Diagnosis Same as Pre-procedure Electronic Signature(s) Signed: 06/24/2022 3:54:20 PM By: Carlene Coria RN Entered By: Carlene Coria on 06/24/2022 15:54:20 -------------------------------------------------------------------------------- Encounter Discharge Information Details Patient Name: Date of Service: [name] TRICE 06/24/2022 2:00 PM Medical Record Number: [id number] Patient Account Number: [id number] Date of Birth/Sex: Treating RN: 09-Sep-1945 (76 y.o. [name] Primary Care Lanika Colgate: [name] Other Clinician: Referring Keiyon Plack: Treating Angelica Wix/Extender: Alto [place] Weeks in Treatment: 74 Encounter Discharge Information Items Discharge Condition: Stable Ambulatory Status: Wheelchair Discharge Destination: [place], [place] ([id number]) [id number] Page 4 of 9 Transportation: Private Auto Accompanied By: self Schedule Follow-up Appointment: Yes Clinical Summary of Care: Electronic Signature(s) Signed: 06/24/2022 3:55:20 PM By: Carlene Coria RN Entered By: Carlene Coria on 06/24/2022 15:55:20 -------------------------------------------------------------------------------- Lower Extremity Assessment Details Patient Name: Date of Service: [name] TRICE 06/24/2022 2:00 PM Medical Record Number: [id number] Patient Account Number: [id number] Date of Birth/Sex: Treating RN: 12-25-1945 (76 y.o. [name] Primary Care Meridith Romick: [name] Other Clinician: Referring Chessa Barrasso: Treating Mariaelena Cade/Extender: Mayer Masker, Ngwe Weeks in Treatment: 50 Electronic Signature(s) Signed: 06/25/2022 8:53:50 AM By: Carlene Coria RN Entered By: Carlene Coria on 06/24/2022 14:48:44 -------------------------------------------------------------------------------- Multi Wound Chart Details Patient Name: Date of Service: [name] TRICE 06/24/2022 2:00 PM Medical  Record Number: [id number] Patient Account Number: [id number] Date of Birth/Sex: Treating RN: 27-Mar-1946 (76 y.o. [name] Primary Care Kaydince Towles: [name] Other Clinician: Referring Asir Bingley: Treating Demisha Nokes/Extender: Mayer Masker, Ngwe Weeks in Treatment: 44 Vital Signs Height(in): 1 Pulse(bpm): 74 Weight(lbs): 244 Blood Pressure(mmHg): 158/88 Body Mass Index(BMI): 36 Temperature(F): 98.7 Respiratory Rate(breaths/min): 16 [4:Photos:] [N/A:N/A] Right, Lateral Lower Leg N/A N/A Wound Location: Gradually Appeared N/A N/A Wounding Event: Venous Leg Ulcer N/A N/A Primary Etiology: Cataracts, Lymphedema, Sleep N/A N/A Comorbid History: Apnea, Congestive Heart Failure, Hypertension, Osteoarthritis, Neuropathy, Received Chemotherapy, Received Radiation 10/27/2021 N/A N/A Date Acquired: 49 N/A N/A Weeks of Treatment: Open N/A N/A Wound Status: No N/A N/A Wound Recurrence: 5x2x0.1 N/A N/A Measurements L x W x D (cm) 7.854 N/A N/A A (cm) : rea 0.785 N/A N/A Volume (cm) : -566.70% N/A N/A %  Reduction in Area: -565.30% N/A N/A % Reduction in Volume: Full Thickness Without Exposed N/A N/A Classification: Support Structures Medium N/A N/A Exudate Amount: Serosanguineous N/A N/A Exudate Type: red, brown N/A N/A Exudate Color: Medium (34-66%) N/A N/A Granulation Amount: Pink N/A N/A Granulation Quality: Medium (34-66%) N/A N/A Necrotic Amount: Fat Layer (Subcutaneous Tissue): Yes N/A N/A Exposed Structures: Fascia: No Tendon: No Muscle: No Joint: No Bone: No None N/A N/A Epithelialization: Treatment Notes Electronic Signature(s) Signed: 06/25/2022 8:53:50 AM By: Carlene Coria RN Entered By: Carlene Coria on 06/24/2022 14:49:08 -------------------------------------------------------------------------------- Multi-Disciplinary Care Plan Details Patient Name: Date of Service: [name] TRICE 06/24/2022 2:00 PM Medical Record Number:  [id number] Patient Account Number: [id number] Date of Birth/Sex: Treating RN: November 13, 1945 (76 y.o. [name] Primary Care Nasri Boakye: [name] Other Clinician: Referring Emoni Yang: Treating Ozias Dicenzo/Extender: Mayer Masker, Ngwe Weeks in Treatment: 48 Active Inactive Electronic Signature(s) Signed: 06/25/2022 8:53:50 AM By: Carlene Coria RN Entered By: Carlene Coria on 06/24/2022 14:48:59 [name] ([id number]) [id number] Page 6 of 9 -------------------------------------------------------------------------------- Pain Assessment Details Patient Name: Date of Service: [name] TRICE 06/24/2022 2:00 PM Medical Record Number: [id number] Patient Account Number: [id number] Date of Birth/Sex: Treating RN: 27-Oct-1945 (76 y.o. [name] Primary Care Rakhi Romagnoli: [name] Other Clinician: Referring Geordan Xu: Treating Etana Beets/Extender: Mayer Masker, Ngwe Weeks in Treatment: 44 Active Problems Location of Pain Severity and Description of Pain Patient Has Paino No Site Locations Pain Management and Medication Current Pain Management: Electronic Signature(s) Signed: 06/25/2022 8:53:50 AM By: Carlene Coria RN Entered By: Carlene Coria on 06/24/2022 14:45:16 -------------------------------------------------------------------------------- Patient/Caregiver Education Details Patient Name: Date of Service: [name] TRICE 10/17/2023andnbsp2:00 PM Medical Record Number: [id number] Patient Account Number: [id number] Date of Birth/Gender: Treating RN: 11-27-1945 (76 y.o. [name] Primary Care Physician: [name] Other Clinician: Referring Physician: Treating Physician/Extender: Mayer Masker, Ngwe Weeks in Treatment: [address], [place] ([id number]) [id number] Page 7 of 9 Education Assessment Education Provided To: Patient Education Topics Provided Wound/Skin Impairment: Methods:  Explain/Verbal Responses: State content correctly Electronic Signature(s) Signed: 06/25/2022 8:53:50 AM By: Carlene Coria RN Entered By: Carlene Coria on 06/24/2022 15:54:40 -------------------------------------------------------------------------------- Wound Assessment Details Patient Name: Date of Service: [name] TRICE 06/24/2022 2:00 PM Medical Record Number: [id number] Patient Account Number: [id number] Date of Birth/Sex: Treating RN: Dec 12, 1945 (76 y.o. [name] Primary Care Selmer Adduci: [name] Other Clinician: Referring Deedra Pro: Treating Jourdon Zimmerle/Extender: Mayer Masker, Ngwe Weeks in Treatment: 69 Wound Status Wound Number: 4 Primary Venous Leg Ulcer Etiology: Wound Location: Right, Lateral Lower Leg Wound Open Wounding Event: Gradually Appeared Status: Date Acquired: 10/27/2021 Comorbid Cataracts, Lymphedema, Sleep Apnea, Congestive Heart Failure, Weeks Of Treatment: 34 History: Hypertension, Osteoarthritis, Neuropathy, Received Chemotherapy, Clustered Wound: No Received Radiation Photos Wound Measurements Length: (cm) 5 Width: (cm) 2 Depth: (cm) 0.1 Area: (cm) 7.854 Volume: (cm) 0.785 % Reduction in Area: -566.7% % Reduction in Volume: -565.3% Epithelialization: None Tunneling: No Undermining: No Wound Description Classification: Full Thickness Without Exposed Support Exudate Amount: Medium Exudate Type: Serosanguineous Exudate Color: red, brown Lieder, Rockie ([id number]) Structures Foul Odor After Cleansing: No Slough/Fibrino Yes [id number] Page 8 of 9 Wound Bed Granulation Amount: Medium (34-66%) Exposed Structure Granulation Quality: Pink Fascia Exposed: No Necrotic Amount: Medium (34-66%) Fat Layer (Subcutaneous Tissue) Exposed: Yes Necrotic Quality: Adherent Slough Tendon Exposed: No Muscle Exposed: No Joint Exposed: No Bone Exposed: No Treatment Notes Wound #4 (Lower Leg) Wound Laterality:  Right, Lateral Cleanser Soap and Water Discharge Instruction: Gently  cleanse wound with antibacterial soap, rinse and pat dry prior to dressing wounds Wound Cleanser Discharge Instruction: Wash your hands with soap and water. Remove old dressing, discard into plastic bag and place into trash. Cleanse the wound with Wound Cleanser prior to applying a clean dressing using gauze sponges, not tissues or cotton balls. Do not scrub or use excessive force. Pat dry using gauze sponges, not tissue or cotton balls. Peri-Wound Care Topical Primary Dressing Silvercel Small 2x2 (in/in) Discharge Instruction: Apply Silvercel Small 2x2 (in/in) as instructed Secondary Dressing ABD Pad 5x9 (in/in) Discharge Instruction: Cover with ABD pad Secured With Compression Wrap 3-LAYER WRAP - Profore Lite LF 3 Multilayer Compression Bandaging System Discharge Instruction: Apply 3 multi-layer wrap as prescribed. Compression Stockings Add-Ons Electronic Signature(s) Signed: 06/25/2022 8:53:50 AM By: Carlene Coria RN Entered By: Carlene Coria on 06/24/2022 14:48:29 -------------------------------------------------------------------------------- Vitals Details Patient Name: Date of Service: [name] TRICE 06/24/2022 2:00 PM Medical Record Number: [id number] Patient Account Number: [id number] Date of Birth/Sex: Treating RN: 12-16-1945 (76 y.o. [name] Primary Care Tyreke Kaeser: [name] Other Clinician: Referring Tashawn Greff: Treating Myeasha Ballowe/Extender: Mayer Masker, Ngwe Weeks in Treatment: 71 Vital Signs Time Taken: 14:36 Temperature (F): 98.7 Height (in): 69 Pulse (bpm): 88 Weight (lbs): 244 Respiratory Rate (breaths/min): 16 Linan, Zani ([id number]) [id number] Page 9 of 9 Body Mass Index (BMI): 36 Blood Pressure (mmHg): 158/88 Reference Range: 80 - 120 mg / dl Electronic Signature(s) Signed: 06/25/2022 8:53:50 AM By: Carlene Coria RN Entered By:  Carlene Coria on 06/24/2022 14:36:36

## 2022-06-24 NOTE — Progress Notes (Signed)
AMAIYAH, NORDHOFF ([id number]) [id number] Page 1 of 13 Visit Report for 06/24/2022 Chief Complaint Document Details Patient Name: Date of Service: [name] 06/24/2022 2:00 PM Medical Record Number: [id number] Patient Account Number: [id number] Date of Birth/Sex: Treating RN: 1946/02/17 (76 y.o. [name] Primary Care Provider: [name] Other Clinician: Referring Provider: Treating Provider/Extender: Mayer Masker, Ngwe Weeks in Treatment: 38 Information Obtained from: Patient Chief Complaint Bilateral LE Ulcers Electronic Signature(s) Signed: 06/24/2022 2:11:07 PM By: Worthy Keeler PA-C Entered By: Worthy Keeler on 06/24/2022 14:11:06 -------------------------------------------------------------------------------- HPI Details Patient Name: Date of Service: [name] 06/24/2022 2:00 PM Medical Record Number: [id number] Patient Account Number: [id number] Date of Birth/Sex: Treating RN: 1945-12-08 (76 y.o. [name] Primary Care Provider: [name] Other Clinician: Referring Provider: Treating Provider/Extender: Mayer Masker, Ngwe Weeks in Treatment: 2 History of Present Illness HPI Description: 03/15/2020 upon evaluation today patient presents today for initial evaluation here in our clinic concerning a wound that is on the left posterior lower extremity. Unfortunately this has been giving the patient some discomfort at this point which she notes has been affected her pretty much daily. The patient does have a history of venous insufficiency, hypertension, congestive heart failure, and a history of breast cancer. Fortunately there does not appear to be evidence of active infection at this time which is great news. No fevers, chills, nausea, vomiting, or diarrhea. Wound is not extremely large but does have some slough covering the surface of the wound. This is going require sharp debridement  today. 03/15/2020 upon evaluation today patient appears to be doing fairly well in regard to her wound. She does have some slough noted on the surface of the wound currently but I do believe the Iodoflex has been beneficial over the past week. There is no signs of active infection at this time which is great news and overall very pleased with the progress. No fevers, chills, nausea, vomiting, or diarrhea. 04/02/2020 upon evaluation today patient appears to be doing a little better in regard to her wound size wise this is not tremendously smaller she does have some slough buildup on the surface of the wound. With that being said this is going require some sharp debridement today to clear away some of the necrotic debris. Fortunately there is no evidence of active infection at this time. No fevers, chills, nausea, vomiting, or diarrhea. 04/10/2020 on evaluation today patient appears to be doing well with regard to her ulcer which is measuring somewhat smaller today. She actually has more granulation tissue noted which is also good news is no need for sharp debridement today. Fortunately there is no evidence of infection either which is also excellent. No fevers, chills, nausea, vomiting, or diarrhea. SAIMA, MONTERROSO ([id number]) [id number] Page 2 of 13 04/26/2020 on evaluation today patient's wound actually is showing signs of good improvement which is great news. There does not appear to be any evidence of active infection. I do believe she is tolerating the collagen at this point. 05/03/2020 upon evaluation today patient's wound actually showed signs of good granulation at this time there does not appear to be any evidence of active infection which is great news and overall very pleased with where things stand. With that being said I do believe that the patient is can require some sharp debridement today but fortunately nothing too significant. 05/11/20 on evaluation today  patient appears to be doing well in regard to her leg ulcer. She's been tolerating  the dressing changes without complication. Fortunately there is no signs of active infection at this time. Overall I'm very pleased with where things stand. 05/18/2020 upon evaluation today patient's wound is showing signs of improvement is very dry and I think the collagen for the most part just dried to the wound bed. I am going to clean this away with sharp debridement in order to get this under control in my opinion. 05/25/2020 upon evaluation today patient actually appears to be doing well in regard to her leg ulcer. In fact upon inspection it appears she could potentially be completely healed but that is not guaranteed based on what we are seeing. There does not appear to be any signs of active infection which is great news. 05/31/2020 on evaluation today patient appears to be doing well with regard to her wounds. She in fact appears to be almost completely healed on the left leg there is just a very small opening remaining point 1 06/08/2020 upon evaluation today patient appears to be doing excellent in regard to her leg ulcer on the left in fact it appears to be that she is completely healed as of today. I see no signs of active infection at this time which is great news. No fevers, chills, nausea, vomiting, or diarrhea. READMISSION 01/16/2021 Patient that we have had in this clinic with a wound on the left posterior calf in the summer 2021 extending into October. She was felt to have chronic venous insufficiency. Per the patient's description she was discharged with what sounds like external compression stockings although she could not afford the "$75 per leg". She therefore did not get anything. Apparently her wound that she has currently is been in existence since January. She has been followed at vein and vascular with Unna boots and I think calcium alginate. She had ABIs done on 06/25/2020 that showed an  noncompressible ABI on the right leg and the left leg but triphasic waveforms with good great toe pressures and a TBI of 0.90 on the right and 0.91 on the left Surprisingly the patient also had venous reflux studies that were really unremarkable. This included no evidence of DVT or SVT but there was no evidence of s s deep venous insufficiency or superficial venous insufficiency in the greater saphenous or short saphenous veins bilaterally. I would wonder about more central venous issues or perhaps this is all lymphedema 01/25/2021 upon evaluation today patient appears to be doing decently well and guard to her wound. The good news is the Iodoflex that is job she saw Dr. Dellia Nims last week for readmission and to be honest I think that this has done extremely well over that week. I do believe that she can tolerate the sharp debridement today which will be good. I think that cleaning off the wound will likely allow [place] to be able to use a different type of dressing I do not think the Iodoflex will even be necessary based on how clean the wound looks. Fortunately there is no sign of active infection at this time which is great news. No fevers, chills, nausea, vomiting, or diarrhea. 02/12/2021 upon evaluation today patient appears to be doing well with regard to her leg ulcer. We did switch to alginate when she came for nurse visit I think is doing much better for her. Fortunately there does not appear to be any signs of active infection at this time which is great news. No fevers, chills, nausea, vomiting, or diarrhea. 02/18/2021 upon evaluation today patient's wound  is actually showing signs of good improvement. I am very pleased with where things stand currently. I do not see any signs of active infection which is great news and overall I feel like she is making good progress. The patient likewise is happy that things are doing so well and that this is measuring somewhat smaller. 02/25/2021 upon evaluation  today patient actually appears to be doing quite well in regard to her wounds. She has been tolerating the dressing changes without complication. Fortunately there does not appear to be any signs of active infection which is great news and overall I am extremely pleased with where things stand. No fevers, chills, nausea, vomiting, or diarrhea. She does have a lot of edema I really do think she needs compression socks to be worn in order to prevent things from causing additional issues for her currently. Especially on the right leg she should be wearing compression socks all the time to be honest. 03/04/2021 upon evaluation today patient's leg ulcer actually appears to be doing pretty well which is great news. There does not appear to be any significant change but overall the appearance is better even though the size is not necessarily reflecting a great improvement. Fortunately there does not appear to be any signs of active infection. No fevers, chills, nausea, vomiting, or diarrhea. 03/12/2021 upon evaluation today patient appears to be doing well with regard to her wounds. She has been tolerating the dressing changes without complication. The good news is that the wound on her left lateral leg is doing much better and is showing signs of good improvement. Overall her swelling is controlled well as well. She does need some compression socks she plans to go Jefferey socks next week. 03/18/2021 on evaluation today patient's wound actually is showing signs of good improvement. She does have a little bit of slough this can require some sharp debridement today. 03/25/2021 upon evaluation today patient appears to be doing well with regard to her wound on the left lateral leg. She has been tolerating the dressing changes without complication. Fortunately there does not appear to be any signs of active infection at this time. No fever chills no 04/01/2021 upon evaluation today patient's wound is showing signs of  improvement and overall very pleased with where things stand at this point. There is no evidence of active infection which is great news as well and in general I think that she is making great progress. 04/09/2021 upon evaluation today patient appears to be doing well with regard to her ankle ulcer. She is making good progress currently which is great news. There does not appear to be any signs of infection also excellent news. In general I am extremely pleased with where things stand today. No fevers, chills, nausea, vomiting, or diarrhea. 04/23/2021 upon inspection today patient appears to be doing quite well with regard to her leg ulcer. She has been tolerating the dressing changes without complication. She is can require some sharp debridement today but overall seems to be doing excellent. 05/06/2021 upon evaluation today patient unfortunately appears to be doing poorly overall in regard to her swelling. She is actually significantly more swollen than last week despite the fact that her wound is doing better this is not a good trend. I think that she probably needs to see her cardiologist ASAP and I discussed that with her today. This includes the fact that honestly I think she probably is getting need an increase in her diuretic or something to try to clear  away some of the excess fluid that she is experiencing at the moment. She is not been doing her pumps even twice a day but again right now more concerned that if she were to do the pump she would fluid overload her lungs causing other issues as far as her breathing is concerned. Obviously I think she needs to contact them and move up the appointment for the 12th of something sooner 2 weeks is a bit too long to wait with how swollen she has. 05/14/2021 upon evaluation today patient's wound actually showing signs of being a little bit smaller compared to previous. She has been making good progress however. Fortunately there does not appear to be any  evidence of active infection which is great. Overall I am extremely pleased in that regard. Nonetheless I am still concerned that she is having quite a bit of issue as far as the swelling is concerned she has been placed on fluid restriction as well as an increase in the furosemide by her doctor. We will see how things progress. 05/21/2021 upon evaluation today patient appears to be doing well with regard to her wound in general. She has been tolerating the dressing changes without TALULA, ISLAND ([id number]) [id number] Page 3 of 13 complication. With that being said she has been using silver cell for a while and while this was showing signs of improvement it is now gotten to the point where this has slowed down quite significantly. I do believe that switching to a different dressing may be beneficial for her today. 05/28/2021 upon evaluation today patient actually appears to be doing quite well in regard to her wound. In fact there is really not any need for sharp debridement today based on what I see. The Hydrofera Blue is doing great and overall I think that she is managing quite nicely with the compression wrapping. Her edema is still down quite a bit with the wrapping in place. 06/11/2021 upon evaluation today patient appears to be doing well with regard to her leg ulcer. She has been tolerating the dressing changes without complication. Fortunately there does not appear to be any signs of active infection at this time which is great news. No fevers, chills, nausea, vomiting, or diarrhea. She is going require some sharp debridement today. 10/11; left leg ulcer much better looking and with improved surface area. She is using Hydrofera Blue under 3 layer compression. She does not have any stocking on the right leg which in itself has very significant nonpitting edema 06/25/2021 upon evaluation today patient appears to be doing well with regard to the wound on her left  leg. She has been tolerating the dressing changes without complication and overall I am extremely pleased with where things stand today. I do not see any evidence of infection which is great news. 07/02/2021 upon evaluation today patient's wound actually showing signs of excellent improvement and actually very pleased with where we stand today and the overall appearance. Fortunately there does not appear to be any signs of active infection. No fevers, chills, nausea, vomiting, or diarrhea. 07/09/2021 upon evaluation today patient appears to be doing better in regard to her wound this is measuring smaller and she is headed in the appropriate direction based on what I am seeing currently. I do not see any evidence of active infection systemically which is great news. 07/16/2021 upon evaluation today patient appears to be doing pretty well currently in regard to her leg ulcer. This is measuring smaller and looking much better  this is great news. Fortunately there does not appear to be any evidence of active infection systemically which is great news as well. 07/23/2021 upon evaluation today patient's wound is actually showing signs of good improvement this is definitely measuring smaller. I do not see any signs of infection which is great news and overall very pleased in that regard. Overall I think that she is doing well with the [facility] 07/30/2020 upon evaluation today patient appears to be doing well in regard to her wound. She has been tolerating the dressing changes without complication think the Hydrofera Blue at this point is actually causing her to dry out a lot as far as the wound bed is concerned. Subsequently I think that we need to try to see what we can do to improve this. Fortunately there does not appear to be any evidence of active infection at this time which is great news. 08/06/2021 upon evaluation today patient appears to be doing well with regards to her wound. Is not measuring  significantly smaller initial inspection but upon closer inspection she has a lot of new skin growth across the central portion of the wound. I think will get very close to complete resolution. 08/13/2021 upon evaluation today patient appears to be doing well with regard to her wound. In fact this appears to be I believe healed although there is still some question as to whether it is completely so. I am concerned about the fact that to be honest she still has some evidence of dry skin that could be just trapping something underneath I still want to debride anything as I am afraid of causing some damage to the good skin for that reason I Georgina Peer probably monitor for 1 more week before completely closing everything out. 08/20/2021 upon evaluation today patient actually appears to be doing excellent. In fact she appears to be completely healed. This was the case last week as well we just wanted to make sure nothing reopened since she does not really have an ability to put on compression socks this is good to be something we need to make sure was well-healed. Nonetheless I think we have achieved that goal as of today. 12/27; this is a patient we discharged 2 weeks ago with wounds on her left lower leg laterally. She was supposed to get stockings and really did not get them. She developed blistering and reopening probably sometime late last week. She has 3 areas in the same area as previously. The mid calf dimensions on the left went from 39 to 51 cm today. She also has very significant edema on the right leg but as far as I am aware has not had wounds in this area 1/4; patient presents for follow-up. She has tolerated the compression wrap well. She has no issues or complaints today. 09/17/2021 upon evaluation today patient appears to be doing well with regard to her wounds. She has been tolerating the dressing changes without complication. Fortunately there does not appear to be any signs of active infection  at this time. No fevers, chills, nausea, vomiting, or diarrhea. 10/01/2021 upon evaluation today patient appears to be doing well with regard to her wound this is actually measuring better and looking smaller and I am very pleased in that regard. Fortunately I do not see any evidence of active infection locally nor systemically at this point. No fevers, chills, nausea, vomiting, or diarrhea. 10/08/2021 upon evaluation patient's wounds are actually showing some signs of improvement measuring a little bit smaller  and looking a little bit better. Overall I do not think there is any need for sharp debridement today which is good news. No fevers, chills, nausea, vomiting, or diarrhea. 10/14/2021 upon evaluation today patient appears to be doing well with regard to her wound. She has been tolerating the dressing changes without complication and overall I am extremely pleased with where things stand today. There is a little bit of film on the surface of the wounds but this was carefully cleaned away with just saline and gauze she really did not want me to do any sharp debridement today. 10/29/2021 upon evaluation today patient appears to be doing quite well in regard to her wounds. She is actually showing signs of excellent improvement which is great news and overall I feel like we are on the right track at this time. She does have a wound on both legs and she does need something compression wise when she heals for that reason we will get a go and see about ordering her bilateral juxta lite compression wraps. 11/05/2021 upon evaluation today patient appears to be doing well with regard to her wound. She has been tolerating the dressing changes without complication. Fortunately I do not see any evidence of active infection locally nor systemically at this time which is great news. No fevers, chills, nausea, vomiting, or diarrhea. 11/12/2021 upon evaluation today patient appears to be doing well with regard to her  wounds. She is doing excellent on the left on the right this is not doing quite as well there is some slough and biofilm buildup. 3/14; patient presents for follow-up. She has no issues or complaints today. She has tolerated the compression wraps well. 11/26/2021 upon evaluation today patient appears to be doing well with regard to her wounds. Both are showing signs of good improvement which is great news. I do not see any evidence of active infection and overall think she is healing quite nicely. 12/03/2021 upon evaluation today patient appears to be doing well with regard to her wounds. Both are showing signs of excellent improvement I am very pleased with where things stand and I think that she is making good progress here. Fortunately I do not see any signs of active infection locally or systemically which is great news. No fevers, chills, nausea, vomiting, or diarrhea. 12-10-2021 upon evaluation today patient's wounds actually appear to be doing awesome. I am extremely pleased with where we stand I think she is making excellent progress. I do not see any signs of active infection locally or systemically at this time. 12-17-2021 upon evaluation today patient's wounds are actually showing signs of good improvement. Fortunately I do not see any evidence of active infection locally nor systemically which is great news. Overall I do not believe that she is showing any signs of significant worsening which is good news. With that being said I do believe that she has a little bit of film on the surface of the wound I was actually able to clear this away with saline and gauze no sharp debridement was necessary. SHUNTIA, EXTON ([id number]) [id number] Page 4 of 13 12-24-2021 upon evaluation today patient appears to be doing well with regard to her wounds. I do not see any signs of active infection currently which is great news and overall I think that we are on the right track  here. No fevers, chills, nausea, vomiting, or diarrhea. 12-31-2021 upon evaluation today patient appears to be doing well with regard to her wounds. Both are showing signs  of improvement the wound on the right leg is going require some sharp debridement on the left seems to be doing quite well. 01-07-2022 upon evaluation today patient appears to be doing well currently in regard to her wounds. She is actually making good progress bilaterally. Both of them require little bit of sharp debridement but not too much which is great news. Upon inspection patient's wound bed actually showed signs of good granulation and epithelization on both legs. She still is having significant issues with the right leg compared to the left although I do feel like both are actually showing signs of pretty good improvement. 01-21-2022 upon evaluation today patient's wound is actually showing signs of excellent improvement I am very pleased with where we stand currently. I do not see any signs of active infection locally or systemically which is great news. No fevers, chills, nausea, vomiting, or diarrhea. 01-28-2022 upon evaluation today patient appears to be doing well with regard to her wound on the left leg this is pretty much just about healed which is great news. Unfortunately in regard to the wound on her right leg this is not doing nearly as well in fact I think we probably need to see about a culture today that was discussed with her. 02-04-2022 upon evaluation today patient appears to be doing well with regard to her left leg which in fact might actually be completely healed I am not 100% sure. Nonetheless this is shown signs of excellent improvement which is great news. With that being said in regards to the right leg there is some slough and film buildup on the surface of the wound although she is feeling much better than last week this does appear to be doing much better than it was last week. Fortunately there does  not appear to be any signs of active infection locally or systemically at this time. 02-11-2022 upon evaluation today patient's wounds appear to be doing decently well. In fact the left leg is healed although there is brand-new skin were definitely given still need to wrap her for the time being. On the right leg this is showing signs of some epithelial growth in the middle of the wound it still measures the same because there is still a larger area with speckled openings but again as far as the entire area being open this is significantly improved which is great news. I am very pleased. 6/13; left leg remains healed and she has a juxta light to apply to it today. There is some concern about whether she is going to be able to do this at home. She lives with a daughter although she has her own disability. On the right the wound looks smaller to me nice epithelialization good edema control. 02-25-2022 upon evaluation today patient appears to be doing well with regard to her legs the left leg is still healed the right leg is doing much better. I think we are on the right track here. With that being said unfortunately she continues to have significant issues with some other problems and she actually tells me on Saturday she was having lunch and went inside to sit down when she tells me she had all of a sudden a sharp sensation that went from her hand through her arm into her leg and her neck and she tells me that she could not see anything for the next hour it was completely black. Slowly after this her vision started to return and has been normal since. It sounds  to me as if she may have had a TIA or mini stroke at this point. Nonetheless I think that she needs to have this checked out ASAP. Her primary care provider is Dr. Clide Deutscher at [facility]. We will get a try to get in touch with him. 03-10-2022 upon evaluation today patient's wound actually showed signs of good improvement. She seems to be making  excellent progress here. I do not see any evidence of active infection locally or systemically which is great news. No fevers, chills, nausea, vomiting, or diarrhea. She was in the hospital since have seen her last she ended up finding out that she was having syncopal episodes. That is what was going on with the blacking out and losing vision. Subsequently they adjusted some of her medication she seems to be doing better. 03-18-2022 upon evaluation today patient's wound is actually showing signs of good granulation and epithelization at this point. Fortunately I do not see any signs of active infection locally or systemically which is great news. 03-25-2022 any evidence of active infection locally or systemically which is great news. Any evidence of active infection which is great news and overall the wound is looking better. Upon evaluation today patient appears to be doing well with regard to her leg ulcer. She has been tolerating the dressing changes without complication. Fortunately I do not see 7/25; the patient's wound is on the right lateral lower extremity using silver alginate 3 layer compression. She has chronic venous insufficiency and lymphedema. We are using a juxta lite on the left leg which does not have any open wounds but she is leaving this on all week it seems that there just is not a way to get this changed at home 04-08-2022 upon evaluation today patient appears to be doing well currently in regard to her wound. She has been tolerating the dressing changes without complication. Fortunately I do not see any evidence of active infection locally or systemically which is great news. No fever or chills noted her left leg has reopened at this point. 04-15-2022 upon evaluation patient's wounds actually are showing signs of improvement which is great news. Little by little I do believe her making progress here. In regard to the right leg this is looking better though the measurement does not  speak to it there is a lot of new skin growth. On the left leg this is significantly smaller compared to last week most of the blistered area has completely closed. 04-22-2022 upon evaluation today patient appears to be doing excellent in regard to her wounds both are showing signs of improvement. Her knees and above are still extremely swollen the legs were rewrapped them are down significantly. With that being said I do not think that we will get a be able to keep them that way once were not rapid noted that she really has no way to be able to put juxta lites on and off and nobody to help her with that. That is the biggest concern that we have here at this point. Fortunately I do not think that there is any evidence of infection which is great news. 04-29-2022 upon evaluation today patient appears to be doing well with regard to her wounds. She has been tolerating the dressing changes without complication. Fortunately there does not appear to be any evidence of infection locally or systemically which is great news and overall I am extremely pleased in that regard. Left leg is healed the right leg is very close we discussed  today the possibility of her granddaughter helping with putting on and off compression wraps for her. 05-06-2022 upon evaluation today patient appears to be doing well currently in regard to her left leg which is showing signs of being completely healed. With regard to her right leg she still has an open area though this is doing much better there is a lot less drainage than what we previously noted. Fortunately I see no evidence of active infection locally or systemically at this time which is great news. 05-20-2022 upon evaluation today patient appears to be doing excellent in regard to her leg. The left leg is still healed the right leg is much better. Fortunately I see no signs of infection locally or systemically at this time which is great news. No fevers, chills, nausea,  vomiting, or diarrhea. 05-27-2022 upon evaluation today patient appears to be doing excellent in regard to her wound this is actually measuring smaller and looking much better. Fortunately I see no signs of active infection locally or systemically at this time. 06-03-2022 upon evaluation patient's wound actually is measuring smaller and looking much better. She still has a lot of discomfort but fortunately nothing to significant at this point which is great news. No fevers, chills, nausea, vomiting, or diarrhea. I think we are making some pretty good progress here. 06-10-2022 upon evaluation today patient appears to be doing well currently in regard to her wound which is actually measuring somewhat smaller. This is actually 2 separate wounds that are clustered and this is making it appear little bit larger than what it is but nonetheless we are seeing some definite improvement. Fortunately I do not see any signs of active infection at this time. No fevers, chills, nausea, vomiting, or diarrhea. 06-17-2022 upon evaluation today patient appears to be doing well currently in regard to her wound although she was in the hospital since she was last here. KRISTIEN, SALATINO ([id number]) [id number] Page 5 of 13 That was from 06-12-2022 through 06-16-2022 which was yesterday. This was due to a CHF exacerbation. With that being said the patient has noted in her record review acute on chronic congestive heart failure with hypertensive emergency noted. She also has stage IIIa chronic kidney disease noted. With that being said I do think her wound is much better because I got a lot of fluid off to try to diurese her to a degree but her kidneys would not hold for that they put her on furosemide 40 mg and that is helping to get the fluid off she is much better but still has a lot of fluid buildup. 06-24-2022 upon evaluation today patient appears to be doing well currently in regard to her wound  is not significantly smaller but it is improved overall. Fortunately I do not see any signs of active infection locally nor systemically at this time. Electronic Signature(s) Signed: 06/24/2022 3:03:44 PM By: Worthy Keeler PA-C Entered By: Worthy Keeler on 06/24/2022 15:03:44 -------------------------------------------------------------------------------- Physical Exam Details Patient Name: Date of Service: [name] 06/24/2022 2:00 PM Medical Record Number: [id number] Patient Account Number: [id number] Date of Birth/Sex: Treating RN: 04/04/1946 (76 y.o. [name] Primary Care Provider: [name] Other Clinician: Referring Provider: Treating Provider/Extender: Mayer Masker, Ngwe Weeks in Treatment: 56 Constitutional Obese and well-hydrated in no acute distress. Respiratory normal breathing without difficulty. Psychiatric this patient is able to make decisions and demonstrates good insight into disease process. Alert and Oriented x 3. pleasant and cooperative. Notes Upon inspection  patient's wound bed actually showed signs of good granulation and epithelization at this point. Her wound is not significantly smaller but is also not doing nearly as poorly as it was in the past I feel like that the weeping and drainage is much better but we still have a little ways to go here for sure. Electronic Signature(s) Signed: 06/24/2022 3:08:26 PM By: Worthy Keeler PA-C Entered By: Worthy Keeler on 06/24/2022 15:08:26 -------------------------------------------------------------------------------- Physician Orders Details Patient Name: Date of Service: [name] 06/24/2022 2:00 PM Medical Record Number: [id number] Patient Account Number: [id number] Date of Birth/Sex: Treating RN: [birth year] (76 y.o. [name] Primary Care Provider: [name] Other Clinician: Referring Provider: Treating Provider/Extender: Mayer Masker, Ngwe Weeks in  Treatment: 63 Verbal / Phone Orders: No [place], [name] ([id number]) [id number] Page 6 of 13 Diagnosis Coding ICD-10 Coding Code Description I89.0 Lymphedema, not elsewhere classified I87.332 Chronic venous hypertension (idiopathic) with ulcer and inflammation of left lower extremity L97.812 Non-pressure chronic ulcer of other part of right lower leg with fat layer exposed Follow-up Appointments Return Appointment in 1 week. Nurse Visit as needed Bathing/ Shower/ Hygiene May shower with wound dressing protected with water repellent cover or cast protector. No tub bath. Anesthetic (Use 'Patient Medications' Section for Anesthetic Order Entry) Lidocaine applied to wound bed Edema Control - Lymphedema / Segmental Compressive Device / Other Bilateral Lower Extremities Optional: One layer of unna paste to top of compression wrap (to act as an anchor). 3 Layer Compression System for Lymphedema. - left Elevate, Exercise Daily and A void Standing for Long Periods of Time. Elevate legs to the level of the heart and pump ankles as often as possible Elevate leg(s) parallel to the floor when sitting. Compression Pump: Use compression pump on left lower extremity for 60 minutes, twice daily. - You may pump over wraps Compression Pump: Use compression pump on right lower extremity for 60 minutes, twice daily. - You may pump over wraps Additional Orders / Instructions Follow Nutritious Diet and Increase Protein Intake Wound Treatment Wound #4 - Lower Leg Wound Laterality: Right, Lateral Cleanser: Soap and Water 1 x Per Week/30 Days Discharge Instructions: Gently cleanse wound with antibacterial soap, rinse and pat dry prior to dressing wounds Cleanser: Wound Cleanser 1 x Per Week/30 Days Discharge Instructions: Wash your hands with soap and water. Remove old dressing, discard into plastic bag and place into trash. Cleanse the wound with Wound Cleanser prior to  applying a clean dressing using gauze sponges, not tissues or cotton balls. Do not scrub or use excessive force. Pat dry using gauze sponges, not tissue or cotton balls. Prim Dressing: Silvercel Small 2x2 (in/in) 1 x Per Week/30 Days ary Discharge Instructions: Apply Silvercel Small 2x2 (in/in) as instructed Secondary Dressing: ABD Pad 5x9 (in/in) 1 x Per Week/30 Days Discharge Instructions: Cover with ABD pad Compression Wrap: 3-LAYER WRAP - Profore Lite LF 3 Multilayer Compression Bandaging System 1 x Per Week/30 Days Discharge Instructions: Apply 3 multi-layer wrap as prescribed. Electronic Signature(s) Signed: 06/24/2022 3:55:23 PM By: Worthy Keeler PA-C Signed: 06/25/2022 8:53:50 AM By: Carlene Coria RN Entered By: Carlene Coria on 06/24/2022 15:19:33 -------------------------------------------------------------------------------- Problem List Details Patient Name: Date of Service: [name] 06/24/2022 2:00 PM Medical Record Number: [id number] Patient Account Number: [id number] Date of Birth/Sex: Treating RN: 05-20-1946 (76 y.o. [name] [place], [place] ([id number]) [id number] Page 7 of 13 Primary Care Provider: [name] Other Clinician: Referring Provider: Treating Provider/Extender: [name]  Aycock, Ngwe Weeks in Treatment: 74 Active Problems ICD-10 Encounter Code Description Active Date MDM Diagnosis I89.0 Lymphedema, not elsewhere classified 01/16/2021 No Yes I87.332 Chronic venous hypertension (idiopathic) with ulcer and inflammation of left 01/16/2021 No Yes lower extremity L97.812 Non-pressure chronic ulcer of other part of right lower leg with fat layer 01/07/2022 No Yes exposed Inactive Problems ICD-10 Code Description Active Date Inactive Date L97.822 Non-pressure chronic ulcer of other part of left lower leg with fat layer exposed 01/16/2021 01/16/2021 Resolved Problems Electronic Signature(s) Signed: 06/24/2022  2:11:01 PM By: Worthy Keeler PA-C Entered By: Worthy Keeler on 06/24/2022 14:11:01 -------------------------------------------------------------------------------- Progress Note Details Patient Name: Date of Service: [name] 06/24/2022 2:00 PM Medical Record Number: [id number] Patient Account Number: [id number] Date of Birth/Sex: Treating RN: [birth year] (76 y.o. [name] Primary Care Provider: [name] Other Clinician: Referring Provider: Treating Provider/Extender: Mayer Masker, Ngwe Weeks in Treatment: 14 Subjective Chief Complaint Information obtained from Patient Bilateral LE Ulcers History of Present Illness (HPI) 03/15/2020 upon evaluation today patient presents today for initial evaluation here in our clinic concerning a wound that is on the left posterior lower extremity. Unfortunately this has been giving the patient some discomfort at this point which she notes has been affected her pretty much daily. The patient does have a history of venous insufficiency, hypertension, congestive heart failure, and a history of breast cancer. Fortunately there does not appear to be evidence of active infection at this time which is great news. No fevers, chills, nausea, vomiting, or diarrhea. Wound is not extremely large but does have some slough covering the surface of the wound. This is going require sharp debridement today. 03/15/2020 upon evaluation today patient appears to be doing fairly well in regard to her wound. She does have some slough noted on the surface of the wound [place], [name] ([id number]) [id number] Page 8 of 13 currently but I do believe the Iodoflex has been beneficial over the past week. There is no signs of active infection at this time which is great news and overall very pleased with the progress. No fevers, chills, nausea, vomiting, or diarrhea. 04/02/2020 upon evaluation today patient appears to be doing a  little better in regard to her wound size wise this is not tremendously smaller she does have some slough buildup on the surface of the wound. With that being said this is going require some sharp debridement today to clear away some of the necrotic debris. Fortunately there is no evidence of active infection at this time. No fevers, chills, nausea, vomiting, or diarrhea. 04/10/2020 on evaluation today patient appears to be doing well with regard to her ulcer which is measuring somewhat smaller today. She actually has more granulation tissue noted which is also good news is no need for sharp debridement today. Fortunately there is no evidence of infection either which is also excellent. No fevers, chills, nausea, vomiting, or diarrhea. 04/26/2020 on evaluation today patient's wound actually is showing signs of good improvement which is great news. There does not appear to be any evidence of active infection. I do believe she is tolerating the collagen at this point. 05/03/2020 upon evaluation today patient's wound actually showed signs of good granulation at this time there does not appear to be any evidence of active infection which is great news and overall very pleased with where things stand. With that being said I do believe that the patient is can require some sharp debridement today but fortunately nothing too  significant. 05/11/20 on evaluation today patient appears to be doing well in regard to her leg ulcer. She's been tolerating the dressing changes without complication. Fortunately there is no signs of active infection at this time. Overall I'm very pleased with where things stand. 05/18/2020 upon evaluation today patient's wound is showing signs of improvement is very dry and I think the collagen for the most part just dried to the wound bed. I am going to clean this away with sharp debridement in order to get this under control in my opinion. 05/25/2020 upon evaluation today patient actually  appears to be doing well in regard to her leg ulcer. In fact upon inspection it appears she could potentially be completely healed but that is not guaranteed based on what we are seeing. There does not appear to be any signs of active infection which is great news. 05/31/2020 on evaluation today patient appears to be doing well with regard to her wounds. She in fact appears to be almost completely healed on the left leg there is just a very small opening remaining point 1 06/08/2020 upon evaluation today patient appears to be doing excellent in regard to her leg ulcer on the left in fact it appears to be that she is completely healed as of today. I see no signs of active infection at this time which is great news. No fevers, chills, nausea, vomiting, or diarrhea. READMISSION 01/16/2021 Patient that we have had in this clinic with a wound on the left posterior calf in the summer 2021 extending into October. She was felt to have chronic venous insufficiency. Per the patient's description she was discharged with what sounds like external compression stockings although she could not afford the "$75 per leg". She therefore did not get anything. Apparently her wound that she has currently is been in existence since January. She has been followed at vein and vascular with Unna boots and I think calcium alginate. She had ABIs done on 06/25/2020 that showed an noncompressible ABI on the right leg and the left leg but triphasic waveforms with good great toe pressures and a TBI of 0.90 on the right and 0.91 on the left Surprisingly the patient also had venous reflux studies that were really unremarkable. This included no evidence of DVT or SVT but there was no evidence of s s deep venous insufficiency or superficial venous insufficiency in the greater saphenous or short saphenous veins bilaterally. I would wonder about more central venous issues or perhaps this is all lymphedema 01/25/2021 upon evaluation today  patient appears to be doing decently well and guard to her wound. The good news is the Iodoflex that is job she saw Dr. Dellia Nims last week for readmission and to be honest I think that this has done extremely well over that week. I do believe that she can tolerate the sharp debridement today which will be good. I think that cleaning off the wound will likely allow [place] to be able to use a different type of dressing I do not think the Iodoflex will even be necessary based on how clean the wound looks. Fortunately there is no sign of active infection at this time which is great news. No fevers, chills, nausea, vomiting, or diarrhea. 02/12/2021 upon evaluation today patient appears to be doing well with regard to her leg ulcer. We did switch to alginate when she came for nurse visit I think is doing much better for her. Fortunately there does not appear to be any signs of active  infection at this time which is great news. No fevers, chills, nausea, vomiting, or diarrhea. 02/18/2021 upon evaluation today patient's wound is actually showing signs of good improvement. I am very pleased with where things stand currently. I do not see any signs of active infection which is great news and overall I feel like she is making good progress. The patient likewise is happy that things are doing so well and that this is measuring somewhat smaller. 02/25/2021 upon evaluation today patient actually appears to be doing quite well in regard to her wounds. She has been tolerating the dressing changes without complication. Fortunately there does not appear to be any signs of active infection which is great news and overall I am extremely pleased with where things stand. No fevers, chills, nausea, vomiting, or diarrhea. She does have a lot of edema I really do think she needs compression socks to be worn in order to prevent things from causing additional issues for her currently. Especially on the right leg she should be wearing  compression socks all the time to be honest. 03/04/2021 upon evaluation today patient's leg ulcer actually appears to be doing pretty well which is great news. There does not appear to be any significant change but overall the appearance is better even though the size is not necessarily reflecting a great improvement. Fortunately there does not appear to be any signs of active infection. No fevers, chills, nausea, vomiting, or diarrhea. 03/12/2021 upon evaluation today patient appears to be doing well with regard to her wounds. She has been tolerating the dressing changes without complication. The good news is that the wound on her left lateral leg is doing much better and is showing signs of good improvement. Overall her swelling is controlled well as well. She does need some compression socks she plans to go Jefferey socks next week. 03/18/2021 on evaluation today patient's wound actually is showing signs of good improvement. She does have a little bit of slough this can require some sharp debridement today. 03/25/2021 upon evaluation today patient appears to be doing well with regard to her wound on the left lateral leg. She has been tolerating the dressing changes without complication. Fortunately there does not appear to be any signs of active infection at this time. No fever chills no 04/01/2021 upon evaluation today patient's wound is showing signs of improvement and overall very pleased with where things stand at this point. There is no evidence of active infection which is great news as well and in general I think that she is making great progress. 04/09/2021 upon evaluation today patient appears to be doing well with regard to her ankle ulcer. She is making good progress currently which is great news. There does not appear to be any signs of infection also excellent news. In general I am extremely pleased with where things stand today. No fevers, chills, nausea, vomiting, or diarrhea. 04/23/2021  upon inspection today patient appears to be doing quite well with regard to her leg ulcer. She has been tolerating the dressing changes without complication. She is can require some sharp debridement today but overall seems to be doing excellent. 05/06/2021 upon evaluation today patient unfortunately appears to be doing poorly overall in regard to her swelling. She is actually significantly more swollen than last week despite the fact that her wound is doing better this is not a good trend. I think that she probably needs to see her cardiologist ASAP and I discussed that with her today. This includes the  fact that honestly I think she probably is getting need an increase in her diuretic or something to try to clear LENIYA, BREIT ([id number]) [id number] Page 9 of 13 away some of the excess fluid that she is experiencing at the moment. She is not been doing her pumps even twice a day but again right now more concerned that if she were to do the pump she would fluid overload her lungs causing other issues as far as her breathing is concerned. Obviously I think she needs to contact them and move up the appointment for the 12th of something sooner 2 weeks is a bit too long to wait with how swollen she has. 05/14/2021 upon evaluation today patient's wound actually showing signs of being a little bit smaller compared to previous. She has been making good progress however. Fortunately there does not appear to be any evidence of active infection which is great. Overall I am extremely pleased in that regard. Nonetheless I am still concerned that she is having quite a bit of issue as far as the swelling is concerned she has been placed on fluid restriction as well as an increase in the furosemide by her doctor. We will see how things progress. 05/21/2021 upon evaluation today patient appears to be doing well with regard to her wound in general. She has been tolerating the dressing  changes without complication. With that being said she has been using silver cell for a while and while this was showing signs of improvement it is now gotten to the point where this has slowed down quite significantly. I do believe that switching to a different dressing may be beneficial for her today. 05/28/2021 upon evaluation today patient actually appears to be doing quite well in regard to her wound. In fact there is really not any need for sharp debridement today based on what I see. The Hydrofera Blue is doing great and overall I think that she is managing quite nicely with the compression wrapping. Her edema is still down quite a bit with the wrapping in place. 06/11/2021 upon evaluation today patient appears to be doing well with regard to her leg ulcer. She has been tolerating the dressing changes without complication. Fortunately there does not appear to be any signs of active infection at this time which is great news. No fevers, chills, nausea, vomiting, or diarrhea. She is going require some sharp debridement today. 10/11; left leg ulcer much better looking and with improved surface area. She is using Hydrofera Blue under 3 layer compression. She does not have any stocking on the right leg which in itself has very significant nonpitting edema 06/25/2021 upon evaluation today patient appears to be doing well with regard to the wound on her left leg. She has been tolerating the dressing changes without complication and overall I am extremely pleased with where things stand today. I do not see any evidence of infection which is great news. 07/02/2021 upon evaluation today patient's wound actually showing signs of excellent improvement and actually very pleased with where we stand today and the overall appearance. Fortunately there does not appear to be any signs of active infection. No fevers, chills, nausea, vomiting, or diarrhea. 07/09/2021 upon evaluation today patient appears to be doing  better in regard to her wound this is measuring smaller and she is headed in the appropriate direction based on what I am seeing currently. I do not see any evidence of active infection systemically which is great news. 07/16/2021 upon evaluation today patient  appears to be doing pretty well currently in regard to her leg ulcer. This is measuring smaller and looking much better this is great news. Fortunately there does not appear to be any evidence of active infection systemically which is great news as well. 07/23/2021 upon evaluation today patient's wound is actually showing signs of good improvement this is definitely measuring smaller. I do not see any signs of infection which is great news and overall very pleased in that regard. Overall I think that she is doing well with the [facility]. 07/30/2020 upon evaluation today patient appears to be doing well in regard to her wound. She has been tolerating the dressing changes without complication think the Hydrofera Blue at this point is actually causing her to dry out a lot as far as the wound bed is concerned. Subsequently I think that we need to try to see what we can do to improve this. Fortunately there does not appear to be any evidence of active infection at this time which is great news. 08/06/2021 upon evaluation today patient appears to be doing well with regards to her wound. Is not measuring significantly smaller initial inspection but upon closer inspection she has a lot of new skin growth across the central portion of the wound. I think will get very close to complete resolution. 08/13/2021 upon evaluation today patient appears to be doing well with regard to her wound. In fact this appears to be I believe healed although there is still some question as to whether it is completely so. I am concerned about the fact that to be honest she still has some evidence of dry skin that could be just trapping something underneath I still want to  debride anything as I am afraid of causing some damage to the good skin for that reason I Georgina Peer probably monitor for 1 more week before completely closing everything out. 08/20/2021 upon evaluation today patient actually appears to be doing excellent. In fact she appears to be completely healed. This was the case last week as well we just wanted to make sure nothing reopened since she does not really have an ability to put on compression socks this is good to be something we need to make sure was well-healed. Nonetheless I think we have achieved that goal as of today. 12/27; this is a patient we discharged 2 weeks ago with wounds on her left lower leg laterally. She was supposed to get stockings and really did not get them. She developed blistering and reopening probably sometime late last week. She has 3 areas in the same area as previously. The mid calf dimensions on the left went from 39 to 51 cm today. She also has very significant edema on the right leg but as far as I am aware has not had wounds in this area 1/4; patient presents for follow-up. She has tolerated the compression wrap well. She has no issues or complaints today. 09/17/2021 upon evaluation today patient appears to be doing well with regard to her wounds. She has been tolerating the dressing changes without complication. Fortunately there does not appear to be any signs of active infection at this time. No fevers, chills, nausea, vomiting, or diarrhea. 10/01/2021 upon evaluation today patient appears to be doing well with regard to her wound this is actually measuring better and looking smaller and I am very pleased in that regard. Fortunately I do not see any evidence of active infection locally nor systemically at this point. No fevers, chills, nausea,  vomiting, or diarrhea. 10/08/2021 upon evaluation patient's wounds are actually showing some signs of improvement measuring a little bit smaller and looking a little bit better.  Overall I do not think there is any need for sharp debridement today which is good news. No fevers, chills, nausea, vomiting, or diarrhea. 10/14/2021 upon evaluation today patient appears to be doing well with regard to her wound. She has been tolerating the dressing changes without complication and overall I am extremely pleased with where things stand today. There is a little bit of film on the surface of the wounds but this was carefully cleaned away with just saline and gauze she really did not want me to do any sharp debridement today. 10/29/2021 upon evaluation today patient appears to be doing quite well in regard to her wounds. She is actually showing signs of excellent improvement which is great news and overall I feel like we are on the right track at this time. She does have a wound on both legs and she does need something compression wise when she heals for that reason we will get a go and see about ordering her bilateral juxta lite compression wraps. 11/05/2021 upon evaluation today patient appears to be doing well with regard to her wound. She has been tolerating the dressing changes without complication. Fortunately I do not see any evidence of active infection locally nor systemically at this time which is great news. No fevers, chills, nausea, vomiting, or diarrhea. 11/12/2021 upon evaluation today patient appears to be doing well with regard to her wounds. She is doing excellent on the left on the right this is not doing quite as well there is some slough and biofilm buildup. 3/14; patient presents for follow-up. She has no issues or complaints today. She has tolerated the compression wraps well. 11/26/2021 upon evaluation today patient appears to be doing well with regard to her wounds. Both are showing signs of good improvement which is great news. I do not see any evidence of active infection and overall think she is healing quite nicely. 12/03/2021 upon evaluation today patient appears  to be doing well with regard to her wounds. Both are showing signs of excellent improvement I am very TIMARIE, LABELL ([id number]) [id number] Page 10 of 13 pleased with where things stand and I think that she is making good progress here. Fortunately I do not see any signs of active infection locally or systemically which is great news. No fevers, chills, nausea, vomiting, or diarrhea. 12-10-2021 upon evaluation today patient's wounds actually appear to be doing awesome. I am extremely pleased with where we stand I think she is making excellent progress. I do not see any signs of active infection locally or systemically at this time. 12-17-2021 upon evaluation today patient's wounds are actually showing signs of good improvement. Fortunately I do not see any evidence of active infection locally nor systemically which is great news. Overall I do not believe that she is showing any signs of significant worsening which is good news. With that being said I do believe that she has a little bit of film on the surface of the wound I was actually able to clear this away with saline and gauze no sharp debridement was necessary. 12-24-2021 upon evaluation today patient appears to be doing well with regard to her wounds. I do not see any signs of active infection currently which is great news and overall I think that we are on the right track here. No fevers, chills, nausea, vomiting, or  diarrhea. 12-31-2021 upon evaluation today patient appears to be doing well with regard to her wounds. Both are showing signs of improvement the wound on the right leg is going require some sharp debridement on the left seems to be doing quite well. 01-07-2022 upon evaluation today patient appears to be doing well currently in regard to her wounds. She is actually making good progress bilaterally. Both of them require little bit of sharp debridement but not too much which is great news. Upon inspection  patient's wound bed actually showed signs of good granulation and epithelization on both legs. She still is having significant issues with the right leg compared to the left although I do feel like both are actually showing signs of pretty good improvement. 01-21-2022 upon evaluation today patient's wound is actually showing signs of excellent improvement I am very pleased with where we stand currently. I do not see any signs of active infection locally or systemically which is great news. No fevers, chills, nausea, vomiting, or diarrhea. 01-28-2022 upon evaluation today patient appears to be doing well with regard to her wound on the left leg this is pretty much just about healed which is great news. Unfortunately in regard to the wound on her right leg this is not doing nearly as well in fact I think we probably need to see about a culture today that was discussed with her. 02-04-2022 upon evaluation today patient appears to be doing well with regard to her left leg which in fact might actually be completely healed I am not 100% sure. Nonetheless this is shown signs of excellent improvement which is great news. With that being said in regards to the right leg there is some slough and film buildup on the surface of the wound although she is feeling much better than last week this does appear to be doing much better than it was last week. Fortunately there does not appear to be any signs of active infection locally or systemically at this time. 02-11-2022 upon evaluation today patient's wounds appear to be doing decently well. In fact the left leg is healed although there is brand-new skin were definitely given still need to wrap her for the time being. On the right leg this is showing signs of some epithelial growth in the middle of the wound it still measures the same because there is still a larger area with speckled openings but again as far as the entire area being open this is significantly improved  which is great news. I am very pleased. 6/13; left leg remains healed and she has a juxta light to apply to it today. There is some concern about whether she is going to be able to do this at home. She lives with a daughter although she has her own disability. On the right the wound looks smaller to me nice epithelialization good edema control. 02-25-2022 upon evaluation today patient appears to be doing well with regard to her legs the left leg is still healed the right leg is doing much better. I think we are on the right track here. With that being said unfortunately she continues to have significant issues with some other problems and she actually tells me on Saturday she was having lunch and went inside to sit down when she tells me she had all of a sudden a sharp sensation that went from her hand through her arm into her leg and her neck and she tells me that she could not see anything for the  next hour it was completely black. Slowly after this her vision started to return and has been normal since. It sounds to me as if she may have had a TIA or mini stroke at this point. Nonetheless I think that she needs to have this checked out ASAP. Her primary care provider is Dr. Clide Deutscher at [facility]. We will get a try to get in touch with him. 03-10-2022 upon evaluation today patient's wound actually showed signs of good improvement. She seems to be making excellent progress here. I do not see any evidence of active infection locally or systemically which is great news. No fevers, chills, nausea, vomiting, or diarrhea. She was in the hospital since have seen her last she ended up finding out that she was having syncopal episodes. That is what was going on with the blacking out and losing vision. Subsequently they adjusted some of her medication she seems to be doing better. 03-18-2022 upon evaluation today patient's wound is actually showing signs of good granulation and epithelization at this point.  Fortunately I do not see any signs of active infection locally or systemically which is great news. 03-25-2022 any evidence of active infection locally or systemically which is great news. Any evidence of active infection which is great news and overall the wound is looking better. Upon evaluation today patient appears to be doing well with regard to her leg ulcer. She has been tolerating the dressing changes without complication. Fortunately I do not see 7/25; the patient's wound is on the right lateral lower extremity using silver alginate 3 layer compression. She has chronic venous insufficiency and lymphedema. We are using a juxta lite on the left leg which does not have any open wounds but she is leaving this on all week it seems that there just is not a way to get this changed at home 04-08-2022 upon evaluation today patient appears to be doing well currently in regard to her wound. She has been tolerating the dressing changes without complication. Fortunately I do not see any evidence of active infection locally or systemically which is great news. No fever or chills noted her left leg has reopened at this point. 04-15-2022 upon evaluation patient's wounds actually are showing signs of improvement which is great news. Little by little I do believe her making progress here. In regard to the right leg this is looking better though the measurement does not speak to it there is a lot of new skin growth. On the left leg this is significantly smaller compared to last week most of the blistered area has completely closed. 04-22-2022 upon evaluation today patient appears to be doing excellent in regard to her wounds both are showing signs of improvement. Her knees and above are still extremely swollen the legs were rewrapped them are down significantly. With that being said I do not think that we will get a be able to keep them that way once were not rapid noted that she really has no way to be able to put  juxta lites on and off and nobody to help her with that. That is the biggest concern that we have here at this point. Fortunately I do not think that there is any evidence of infection which is great news. 04-29-2022 upon evaluation today patient appears to be doing well with regard to her wounds. She has been tolerating the dressing changes without complication. Fortunately there does not appear to be any evidence of infection locally or systemically which is great news  and overall I am extremely pleased in that regard. Left leg is healed the right leg is very close we discussed today the possibility of her granddaughter helping with putting on and off compression wraps for her. 05-06-2022 upon evaluation today patient appears to be doing well currently in regard to her left leg which is showing signs of being completely healed. With regard to her right leg she still has an open area though this is doing much better there is a lot less drainage than what we previously noted. Fortunately I see no evidence of active infection locally or systemically at this time which is great news. 05-20-2022 upon evaluation today patient appears to be doing excellent in regard to her leg. The left leg is still healed the right leg is much better. Fortunately I see no signs of infection locally or systemically at this time which is great news. No fevers, chills, nausea, vomiting, or diarrhea. 05-27-2022 upon evaluation today patient appears to be doing excellent in regard to her wound this is actually measuring smaller and looking much better. JASREET, DICKIE ([id number]) [id number] Page 11 of 13 Fortunately I see no signs of active infection locally or systemically at this time. 06-03-2022 upon evaluation patient's wound actually is measuring smaller and looking much better. She still has a lot of discomfort but fortunately nothing to significant at this point which is great news. No fevers,  chills, nausea, vomiting, or diarrhea. I think we are making some pretty good progress here. 06-10-2022 upon evaluation today patient appears to be doing well currently in regard to her wound which is actually measuring somewhat smaller. This is actually 2 separate wounds that are clustered and this is making it appear little bit larger than what it is but nonetheless we are seeing some definite improvement. Fortunately I do not see any signs of active infection at this time. No fevers, chills, nausea, vomiting, or diarrhea. 06-17-2022 upon evaluation today patient appears to be doing well currently in regard to her wound although she was in the hospital since she was last here. That was from 06-12-2022 through 06-16-2022 which was yesterday. This was due to a CHF exacerbation. With that being said the patient has noted in her record review acute on chronic congestive heart failure with hypertensive emergency noted. She also has stage IIIa chronic kidney disease noted. With that being said I do think her wound is much better because I got a lot of fluid off to try to diurese her to a degree but her kidneys would not hold for that they put her on furosemide 40 mg and that is helping to get the fluid off she is much better but still has a lot of fluid buildup. 06-24-2022 upon evaluation today patient appears to be doing well currently in regard to her wound is not significantly smaller but it is improved overall. Fortunately I do not see any signs of active infection locally nor systemically at this time. Objective Constitutional Obese and well-hydrated in no acute distress. Vitals Time Taken: 2:36 PM, Height: 69 in, Weight: 244 lbs, BMI: 36, Temperature: 98.7 F, Pulse: 88 bpm, Respiratory Rate: 16 breaths/min, Blood Pressure: 158/88 mmHg. Respiratory normal breathing without difficulty. Psychiatric this patient is able to make decisions and demonstrates good insight into disease process. Alert and  Oriented x 3. pleasant and cooperative. General Notes: Upon inspection patient's wound bed actually showed signs of good granulation and epithelization at this point. Her wound is not significantly smaller but is also not  doing nearly as poorly as it was in the past I feel like that the weeping and drainage is much better but we still have a little ways to go here for sure. Integumentary (Hair, Skin) Wound #4 status is Open. Original cause of wound was Gradually Appeared. The date acquired was: 10/27/2021. The wound has been in treatment 34 weeks. The wound is located on the Right,Lateral Lower Leg. The wound measures 5cm length x 2cm width x 0.1cm depth; 7.854cm^2 area and 0.785cm^3 volume. There is Fat Layer (Subcutaneous Tissue) exposed. There is no tunneling or undermining noted. There is a medium amount of serosanguineous drainage noted. There is medium (34-66%) pink granulation within the wound bed. There is a medium (34-66%) amount of necrotic tissue within the wound bed including Adherent Slough. 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 Nurse Visit as needed Bathing/ Shower/ Hygiene: May shower with wound dressing protected with water repellent cover or cast protector. No tub bath. Anesthetic (Use 'Patient Medications' Section for Anesthetic Order Entry): Lidocaine applied to wound bed Edema Control - Lymphedema / Segmental Compressive Device / Other: Optional: One layer of unna paste to top of compression wrap (to act as an anchor). 3 Layer Compression System for Lymphedema. - left Elevate, Exercise Daily and Avoid Standing for Long Periods of Time. Elevate legs to the level of the heart and pump ankles as often as  possible Elevate leg(s) parallel to the floor when sitting. Compression Pump: Use compression pump on left lower extremity for 60 minutes, twice daily. - You may pump over wraps Compression Pump: Use compression pump on right lower extremity for 60 minutes, twice daily. - You may pump over wraps Thayer, [name] ([id number]) [id number] Page 12 of 13 Additional Orders / Instructions: Follow Nutritious Diet and Increase Protein Intake WOUND #4: - Lower Leg Wound Laterality: Right, Lateral Cleanser: Soap and Water 1 x Per Week/30 Days Discharge Instructions: Gently cleanse wound with antibacterial soap, rinse and pat dry prior to dressing wounds Cleanser: Wound Cleanser 1 x Per Week/30 Days Discharge Instructions: Wash your hands with soap and water. Remove old dressing, discard into plastic bag and place into trash. Cleanse the wound with Wound Cleanser prior to applying a clean dressing using gauze sponges, not tissues or cotton balls. Do not scrub or use excessive force. Pat dry using gauze sponges, not tissue or cotton balls. Prim Dressing: Silvercel Small 2x2 (in/in) 1 x Per Week/30 Days ary Discharge Instructions: Apply Silvercel Small 2x2 (in/in) as instructed Secondary Dressing: ABD Pad 5x9 (in/in) 1 x Per Week/30 Days Discharge Instructions: Cover with ABD pad Com pression Wrap: 3-LAYER WRAP - Profore Lite LF 3 Multilayer Compression Bandaging System 1 x Per Week/30 Days Discharge Instructions: Apply 3 multi-layer wrap as prescribed. 1. Based on what I am seeing currently I do believe that the patient would benefit from continuing with the compression wrapping I feel like this is really doing a good job here. 2. I am also going to recommend that we have the patient continue to monitor for any signs of worsening or infection. Obviously if anything changes she knows contact the office and let me know. We will see patient back for reevaluation in 1 week here  in the clinic. If anything worsens or changes patient will contact our office for  additional recommendations. Electronic Signature(s) Signed: 06/24/2022 3:08:47 PM By: Worthy Keeler PA-C Entered By: Worthy Keeler on 06/24/2022 15:08:47 -------------------------------------------------------------------------------- SuperBill Details Patient Name: Date of Service: [name] 06/24/2022 Medical Record Number: [id number] Patient Account Number: [id number] Date of Birth/Sex: Treating RN: Jan 26, 1946 (76 y.o. 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

## 2022-06-25 NOTE — Progress Notes (Signed)
Patient ID: [name], female    DOB: [birth year], 76 y.o.   MRN: [id number]  HPI  [name] is a 76 y/o female with a history of breast cancer, CKD, HTN, GERD, sleep apnea, previous tobacco use and chronic heart failure.   Echo report from 03/04/22 reviewed and showed an EF of 50-55% along with mild LVH/ LAE and mild MR.   Admitted 06/12/22 due to acute on chronic CHF with volume overload. Initially given IV lasix with transition to oral diuretics. HTN emergency and meds adjusted. Elevated troponin thought to be due to demand ischemia. Weaned off oxygen to room air. Discharged after 3 days.   [name] presents today for her initial visit with a chief complaint of moderate fatigue with minimal exertion. [name] describes this as chronic in nature. [name] has associated decreased appetite, blurry vision (cataracts), shortness of breath, intermittent chest pain, pedal edema, abdominal distention, dizziness and difficulty sleeping along with this. [name] denies any palpitations or cough.   Hasn't been weighing because [name] doesn't have any scales. Not adding any salt and the granddaughter present says that [name] does all the cooking. Does say that patient likes to eat a lot of sweets and sugary junk foods that other family members bring to her.   Goes to the wound center for her lymphedema and has her legs wrapped 1-2 times/ week.   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    Breast cancer ([place]) 2019   right breast   CHF (congestive heart failure) ([place])    March 28, 2017 echo: EF 50-55%; 1) echo 07/2011: EF 20-25%, mild concentric hypertrophy, diffuse HK, regional WMA cannot be excluded, grade 1 DD, mitral valve with mild regurg, LA mildly dilated). 2) EF 40-45%, mild AS,                      Chronic kidney disease    Complication of anesthesia 2006   pt states "hard to wake up" after L breast surgery    Edema    GERD (gastroesophageal reflux disease)    Headache    Heart murmur    Hypertension    Hypokinesis    global, EF 35-45 % from Echo.   Malignant neoplasm of breast (female), unspecified site 06/14/2018   RIGHT T1b, N0; ER/PR positive, HER-2 negative.   Neuropathy    Obesity    Pain in joint, site unspecified    Personal history of chemotherapy 2006   left breast   Personal history of radiation therapy 2019   right breast   Personal history of radiation therapy 2006   left breast   Pneumonia    Sleep apnea    Tinea pedis    Unspecified hearing loss    bilateral   Past Surgical History:  Procedure Laterality Date   BREAST BIOPSY Right 2019   11 oc-INVASIVE MAMMARY CARCINOMA, NO SPECIAL TYPE   BREAST BIOPSY Right 2019   1030 oc- neg   BREAST LUMPECTOMY  2006   left breast    CATARACT EXTRACTION     left eye   HERNIA REPAIR  05/25/2006   Incarcerated omentum in ventral hernia, Ventralight mesh, combined lap/ open with omental resection.    PARTIAL MASTECTOMY WITH NEEDLE LOCALIZATION Right 06/14/2018  Procedure: PARTIAL MASTECTOMY WITH NEEDLE LOCALIZATION;  Surgeon: Robert Bellow, MD;  Location: [facility] ORS;  Service: General;  Laterality: Right;   PORT-A-CATH REMOVAL  05/25/2006   SENTINEL NODE BIOPSY Right 06/14/2018   Procedure: SENTINEL NODE BIOPSY;  Surgeon: Robert Bellow, MD;  Location: [facility] ORS;  Service: General;  Laterality: Right;   TUBAL LIGATION     Family History  Problem Relation Age of Onset   Hypertension Mother        deceased 36   Cataracts Mother    Breast cancer Other 52       maternal half-sister; deceased 61   Coronary artery disease Other    Social History   Tobacco Use   Smoking status: Former    Packs/day: 0.50    Years: 30.00    Total pack years: 15.00    Types: Cigarettes    Quit date:  07/14/2000    Years since quitting: 21.9   Smokeless tobacco: Never  Substance Use Topics   Alcohol use: No   No Known Allergies Prior to Admission medications   Medication Sig Start Date End Date Taking? Authorizing Provider  acetaminophen-codeine (TYLENOL #3) 300-30 MG tablet Take 1-2 tablets by mouth every 4 (four) hours as needed for moderate pain. 06/05/22  Yes Felipa Furnace, DPM  anastrozole (ARIMIDEX) 1 MG tablet TAKE 1 TABLET BY MOUTH ONCE DAILY. PTAIENT MUST MAKE AN APPOINTMENT TO SEE DR RAO 04/29/22  Yes Sindy Guadeloupe, MD  aspirin EC 81 MG tablet Take 81 mg by mouth daily.   Yes [provider]  cloNIDine (CATAPRES) 0.1 MG tablet Take 1 tablet (0.1 mg total) by mouth 2 (two) times daily as needed (systolic BP > 161 or diastolic BP > 95). 06/15/22  Yes Nicole Kindred A, DO  [facility] CALCIUM + D3 600-20 MG-MCG TABS TAKE 1 TABLET BY MOUTH TWICE A DAY 02/04/22  Yes Sindy Guadeloupe, MD  diclofenac Sodium (VOLTAREN) 1 % GEL Apply 1 application topically as needed. 04/27/20  Yes [provider]  Docusate Sodium 100 MG capsule Take 100 mg by mouth 2 (two) times daily as needed. 02/13/22  Yes [provider]  Incontinence Supply Disposable (BLADDER CONTROL PADS EX ABSORB) MISC Apply 1 Dose topically as needed.  03/22/18  Yes [provider]  isosorbide mononitrate (IMDUR) 60 MG 24 hr tablet Take 1 tablet (60 mg total) by mouth 2 (two) times daily. 04/20/13  Yes Minna Merritts, MD  loratadine (CLARITIN) 10 MG tablet Take 10 mg by mouth daily. 07/20/19  Yes [provider]  losartan (COZAAR) 25 MG tablet Take 1 tablet (25 mg total) by mouth daily. 08/05/21  Yes Minna Merritts, MD  methocarbamol (ROBAXIN) 500 MG tablet Take by mouth. 06/05/22  Yes [provider]  MIRALAX 17 GM/SCOOP powder SMARTSIG:2 Scoopful By Mouth Daily PRN 04/10/21  Yes [provider]  omeprazole (PRILOSEC) 40 MG capsule Take 40 mg by mouth daily. 04/13/21  Yes [provider]  oxybutynin (DITROPAN) 5 MG tablet Take 5 mg by mouth 2 (two) times daily.   Yes [provider]  potassium chloride SA (KLOR-CON) 20 MEQ tablet Take 1 tablet (20 mEq total) by mouth 2 (two) times daily. 08/05/21  Yes Gollan, Kathlene November, MD  Torsemide 40 MG TABS Take 40 mg by mouth daily in the afternoon. 03/06/22  Yes Fritzi Mandes, MD   Review of Systems  Constitutional:  Positive for appetite change (decreased) and fatigue.  HENT:  Negative for congestion, postnasal drip and sore throat.   Eyes:  Positive for visual disturbance (cataracts).  Respiratory:  Positive for shortness of breath. Negative for cough and wheezing.   Cardiovascular:  Positive for chest pain (at times) and leg swelling. Negative for palpitations.  Gastrointestinal:  Positive for abdominal distention (at times). Negative for abdominal pain.  Endocrine: Negative.   Genitourinary: Negative.   Musculoskeletal:  Negative for back pain and neck pain.  Skin:  Positive for wound (right leg).  Allergic/Immunologic: Negative.   Neurological:  Positive for dizziness and light-headedness.  Hematological:  Negative for adenopathy. Does not bruise/bleed easily.  Psychiatric/Behavioral:  Positive for sleep disturbance (chronic difficulty sleeping; sleeping on 1 pillow). Negative for dysphoric mood. The patient is nervous/anxious.    Vitals:   06/26/22 1553  BP: 123/61  Pulse: 61  Resp: 18  SpO2: 100%  Weight: 247 lb (112 kg)  Height: 5' 8"  (1.727 m)   Wt Readings from Last 3 Encounters:  06/26/22 247 lb (112 kg)  06/14/22 248 lb 10.9 oz (112.8 kg)  06/09/22 245 lb (111.1 kg)   Lab Results  Component Value Date   CREATININE 1.05 (H) 06/15/2022   CREATININE 1.28 (H) 06/14/2022   CREATININE 0.97 06/13/2022   Physical Exam Vitals and nursing note reviewed. Exam conducted with a chaperone present (granddaughter).  Constitutional:      Appearance: Normal appearance.  HENT:     Head: Normocephalic  and atraumatic.  Cardiovascular:     Rate and Rhythm: Normal rate and regular rhythm.  Pulmonary:     Effort: Pulmonary effort is normal. No respiratory distress.     Breath sounds: No wheezing or rales.  Abdominal:     General: There is no distension.     Palpations: Abdomen is soft.     Tenderness: There is no abdominal tenderness.  Musculoskeletal:        General: No tenderness.     Cervical back: Normal range of motion and neck supple.     Right lower leg: Edema (wrapped in [employer]) present.     Left lower leg: Edema (wrapped in [employer]) present.  Skin:    General: Skin is warm and dry.  Neurological:     General: No focal deficit present.     Mental Status: [name] is alert and oriented to person, place, and time.  Psychiatric:        Mood and Affect: Mood is anxious. Mood is not depressed.    Assessment & Plan:  1: Chronic heart failure with preserved ejection fraction with structural changes (LVH/LAE)- - NYHA class III - euvolemic today - scales provided today and instructed to weigh daily, write the weight down and call for an overnight weight gain of > 2 pounds or a weekly weight gain of > 5 pounds - not adding salt and granddaughter says that [name] doesn't cook with salt either; says that patient does like to eat junk food that other family members bring in; also also likes to eat pork but is aware that this can high in sodium  - unclear of fluid intake - on GDMT of losartan - discussed changing losartan to entresto and adding SGLT2 but [name] isn't completely sure of what meds [name] takes - BNP 06/13/22 was 367.7  2: HTN with CKD- - BP looked good (123/61) - saw PCP (Aycock) - BMP 06/15/22 reviewed and showed sodium 137, potassium 3.9, creatinine 1.05 and GFR 55 - wanted to check BMP today but  [name] doesn't want to stay to get it done, order placed for her to return tomorrow  3: OSA- - last sleep study was done 5 years ago and [name]'s needing new supplies so that [name] can start  wearing it - will send RX for supplies in but advised her that [name] may have to repeat the sleep study since it's been 5 years  4: Breast cancer- - saw oncology Janese Banks) 06/10/22 - currently taking arimidex  5: Lymphedema- - stage 2 - encouraged her to elevate her legs when sitting for long periods of time - legs are currently wrapped in [employer] boots - limited in her ability to exercise - consider compression boots once [name] no longer needs UNNA boots   Patient did not bring her medications nor a list. Each medication was verbally reviewed with the patient and [name] was encouraged to bring the bottles to every visit to confirm accuracy of list.   Return in 1 month, sooner if needed

## 2022-06-26 ENCOUNTER — Ambulatory Visit: Payer: Medicare Other | Attending: Family | Admitting: Family

## 2022-06-26 ENCOUNTER — Encounter: Payer: Self-pay | Admitting: Family

## 2022-06-26 ENCOUNTER — Other Ambulatory Visit: Payer: Self-pay | Admitting: Family

## 2022-06-26 VITALS — BP 123/61 | HR 61 | Resp 18 | Ht 68.0 in | Wt 247.0 lb

## 2022-06-26 DIAGNOSIS — Z79811 Long term (current) use of aromatase inhibitors: Secondary | ICD-10-CM | POA: Diagnosis not present

## 2022-06-26 DIAGNOSIS — K219 Gastro-esophageal reflux disease without esophagitis: Secondary | ICD-10-CM | POA: Diagnosis not present

## 2022-06-26 DIAGNOSIS — Z79899 Other long term (current) drug therapy: Secondary | ICD-10-CM | POA: Diagnosis not present

## 2022-06-26 DIAGNOSIS — I10 Essential (primary) hypertension: Secondary | ICD-10-CM

## 2022-06-26 DIAGNOSIS — Z87891 Personal history of nicotine dependence: Secondary | ICD-10-CM | POA: Insufficient documentation

## 2022-06-26 DIAGNOSIS — R0789 Other chest pain: Secondary | ICD-10-CM | POA: Insufficient documentation

## 2022-06-26 DIAGNOSIS — C50911 Malignant neoplasm of unspecified site of right female breast: Secondary | ICD-10-CM | POA: Diagnosis not present

## 2022-06-26 DIAGNOSIS — R0602 Shortness of breath: Secondary | ICD-10-CM | POA: Diagnosis not present

## 2022-06-26 DIAGNOSIS — I130 Hypertensive heart and chronic kidney disease with heart failure and stage 1 through stage 4 chronic kidney disease, or unspecified chronic kidney disease: Secondary | ICD-10-CM | POA: Insufficient documentation

## 2022-06-26 DIAGNOSIS — G4733 Obstructive sleep apnea (adult) (pediatric): Secondary | ICD-10-CM | POA: Diagnosis not present

## 2022-06-26 DIAGNOSIS — Z853 Personal history of malignant neoplasm of breast: Secondary | ICD-10-CM | POA: Insufficient documentation

## 2022-06-26 DIAGNOSIS — N189 Chronic kidney disease, unspecified: Secondary | ICD-10-CM | POA: Diagnosis not present

## 2022-06-26 DIAGNOSIS — R42 Dizziness and giddiness: Secondary | ICD-10-CM | POA: Diagnosis not present

## 2022-06-26 DIAGNOSIS — I5032 Chronic diastolic (congestive) heart failure: Secondary | ICD-10-CM | POA: Diagnosis not present

## 2022-06-26 DIAGNOSIS — I890 Lymphedema, not elsewhere classified: Secondary | ICD-10-CM | POA: Diagnosis not present

## 2022-06-26 DIAGNOSIS — Z170 Estrogen receptor positive status [ER+]: Secondary | ICD-10-CM

## 2022-06-26 NOTE — Patient Instructions (Addendum)
Begin weighing daily and call for an overnight weight gain of 3 pounds or more or a weekly weight gain of more than 5 pounds.   If you have voicemail, please make sure your mailbox is cleaned out so that we may leave a message and please make sure to listen to any voicemails.    Elevate legs when sitting for long periods of time.    I'll send in CPAP supplies but you may have to repeat your sleep study.

## 2022-06-27 ENCOUNTER — Inpatient Hospital Stay: Admission: RE | Admit: 2022-06-27 | Payer: Medicare Other | Source: Ambulatory Visit

## 2022-06-27 ENCOUNTER — Encounter: Payer: Self-pay | Admitting: Family

## 2022-07-01 ENCOUNTER — Encounter: Payer: Medicare Other | Admitting: Physician Assistant

## 2022-07-01 DIAGNOSIS — L97812 Non-pressure chronic ulcer of other part of right lower leg with fat layer exposed: Secondary | ICD-10-CM | POA: Diagnosis not present

## 2022-07-01 NOTE — Progress Notes (Signed)
MARANATHA, GROSSI ([id number]) [id number] Page 1 of 2 Visit Report for 07/01/2022 Chief Complaint Document Details Patient Name: Date of Service: [name] 07/01/2022 2:00 PM Medical Record Number: [id number] Patient Account Number: [id number] Date of Birth/Sex: Treating RN: 11/03/1945 (76 y.o. [name] Primary Care Provider: [name] Other Clinician: Referring Provider: Treating Provider/Extender: Mayer Masker, Ngwe Weeks in Treatment: 41 Information Obtained from: Patient Chief Complaint Bilateral LE Ulcers Electronic Signature(s) Signed: 07/01/2022 2:39:45 PM By: Worthy Keeler PA-C Entered By: Worthy Keeler on 07/01/2022 14:39:45 -------------------------------------------------------------------------------- Problem List Details Patient Name: Date of Service: [name] 07/01/2022 2:00 PM Medical Record Number: [id number] Patient Account Number: [id number] Date of Birth/Sex: Treating RN: June 15, 1946 (76 y.o. 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

## 2022-07-02 NOTE — Progress Notes (Signed)
BECCI, BATTY ([id number]) [id number] Page 1 of 8 Visit Report for 07/01/2022 Arrival Information Details Patient Name: Date of Service: [name] TRICE 07/01/2022 2:00 PM Medical Record Number: [id number] Patient Account Number: [id number] Date of Birth/Sex: Treating RN: Feb 28, 1946 (76 y.o. [name] Primary Care Yuepheng Schaller: [name] Other Clinician: Referring Ashleyann Shoun: Treating Chantell Kunkler/Extender: Mayer Masker, Ngwe Weeks in Treatment: 34 Visit Information History Since Last Visit All ordered tests and consults were completed: No Patient Arrived: Wheel Chair Added or deleted any medications: No Arrival Time: 14:11 Any new allergies or adverse reactions: No Accompanied By: self Had a fall or experienced change in No Transfer Assistance: None activities of daily living that may affect Patient Identification Verified: Yes risk of falls: Secondary Verification Process Completed: Yes Signs or symptoms of abuse/neglect since last visito No Patient Requires Transmission-Based Precautions: No Hospitalized since last visit: No Patient Has Alerts: Yes Implantable device outside of the clinic excluding No Patient Alerts: NOT DIABETIC cellular tissue based products placed in the center since last visit: Has Dressing in Place as Prescribed: Yes Has Compression in Place as Prescribed: Yes Pain Present Now: No Electronic Signature(s) Signed: 07/02/2022 9:46:19 AM By: Carlene Coria RN Entered By: Carlene Coria on 07/01/2022 14:17:18 -------------------------------------------------------------------------------- Clinic Level of Care Assessment Details Patient Name: Date of Service: [name] TRICE 07/01/2022 2:00 PM Medical Record Number: [id number] Patient Account Number: [id number] Date of Birth/Sex: Treating RN: 05-16-1946 (76 y.o. [name] Primary Care Witt Plitt: [name] Other Clinician: Referring Kendrew Paci: Treating  Margee Trentham/Extender: Mayer Masker, Ngwe Weeks in Treatment: 55 Clinic Level of Care Assessment Items TOOL 1 Quantity Score '[]'$  - 0 Use when EandM and Procedure is performed on INITIAL visit ASSESSMENTS - Nursing Assessment / Reassessment '[]'$  - 0 General Physical Exam (combine w/ comprehensive assessment (listed just below) when performed on new pt. [address]Mita Vallo, Zaakirah ([id number]) [id number] Page 2 of 8 '[]'$  - 0 Comprehensive Assessment (HX, ROS, Risk Assessments, Wounds Hx, etc.) ASSESSMENTS - Wound and Skin Assessment / Reassessment '[]'$  - 0 Dermatologic / Skin Assessment (not related to wound area) ASSESSMENTS - Ostomy and/or Continence Assessment and Care '[]'$  - 0 Incontinence Assessment and Management '[]'$  - 0 Ostomy Care Assessment and Management (repouching, etc.) PROCESS - Coordination of Care '[]'$  - 0 Simple Patient / Family Education for ongoing care '[]'$  - 0 Complex (extensive) Patient / Family Education for ongoing care '[]'$  - 0 Staff obtains Programmer, systems, Records, T Results / Process Orders est '[]'$  - 0 Staff telephones HHA, Nursing Homes / Clarify orders / etc '[]'$  - 0 Routine Transfer to another Facility (non-emergent condition) '[]'$  - 0 Routine Hospital Admission (non-emergent condition) '[]'$  - 0 New Admissions / Biomedical engineer / Ordering NPWT Apligraf, etc. , '[]'$  - 0 Emergency Hospital Admission (emergent condition) PROCESS - Special Needs '[]'$  - 0 Pediatric / Minor Patient Management '[]'$  - 0 Isolation Patient Management '[]'$  - 0 Hearing / Language / Visual special needs '[]'$  - 0 Assessment of Community assistance (transportation, D/C planning, etc.) '[]'$  - 0 Additional assistance / Altered mentation '[]'$  - 0 Support Surface(s) Assessment (bed, cushion, seat, etc.) INTERVENTIONS - Miscellaneous '[]'$  - 0 External ear exam '[]'$  - 0 Patient Transfer (multiple staff / Civil Service fast streamer / Similar devices) '[]'$  - 0 Simple Staple / Suture removal (25 or  less) '[]'$  - 0 Complex Staple / Suture removal (26 or more) '[]'$  - 0 Hypo/Hyperglycemic Management (do not check if billed separately) '[]'$  - 0 Ankle / Brachial Index (  ABI) - do not check if billed separately Has the patient been seen at the hospital within the last three years: Yes Total Score: 0 Level Of Care: ____ Electronic Signature(s) Signed: 07/02/2022 9:46:19 AM By: Carlene Coria RN Entered By: Carlene Coria on 07/01/2022 15:21:28 -------------------------------------------------------------------------------- Compression Therapy Details Patient Name: Date of Service: [name] TRICE 07/01/2022 2:00 PM Medical Record Number: [id number] Patient Account Number: [id number] Date of Birth/Sex: Treating RN: 1946-02-25 (76 y.o. [name] Primary Care Gayl Ivanoff: [name] Other Clinician: GALINA, HADDOX ([id number]) [id number] Page 3 of 8 Referring Teralyn Mullins: Treating Taylee Gunnells/Extender: Mayer Masker, Ngwe Weeks in Treatment: 79 Compression Therapy Performed for Wound Assessment: Wound #4 Right,Lateral Lower Leg Performed By: Clinician Carlene Coria, RN Compression Type: Three Layer Post Procedure Diagnosis Same as Pre-procedure Electronic Signature(s) Signed: 07/01/2022 3:19:34 PM By: Carlene Coria RN Entered By: Carlene Coria on 07/01/2022 15:19:34 -------------------------------------------------------------------------------- Encounter Discharge Information Details Patient Name: Date of Service: [name] TRICE 07/01/2022 2:00 PM Medical Record Number: [id number] Patient Account Number: [id number] Date of Birth/Sex: Treating RN: 26-Dec-1945 (76 y.o. [name] Primary Care Meghan Warshawsky: [name] Other Clinician: Referring Joury Allcorn: Treating Laquashia Mergenthaler/Extender: Mayer Masker, Ngwe Weeks in Treatment: 66 Encounter Discharge Information Items Discharge Condition: Stable Ambulatory Status: Wheelchair Discharge Destination:  Home Transportation: Private Auto Accompanied By: self Schedule Follow-up Appointment: Yes Clinical Summary of Care: Electronic Signature(s) Signed: 07/01/2022 3:22:24 PM By: Carlene Coria RN Entered By: Carlene Coria on 07/01/2022 15:22:23 -------------------------------------------------------------------------------- Lower Extremity Assessment Details Patient Name: Date of Service: [name] TRICE 07/01/2022 2:00 PM Medical Record Number: [id number] Patient Account Number: [id number] Date of Birth/Sex: Treating RN: [birth year] (76 y.o. [name] Primary Care Jsean Taussig: [name] Other Clinician: Referring Trish Mancinelli: Treating Rhylen Shaheen/Extender: Mayer Masker, Ngwe Weeks in Treatment: 72 Edema Assessment Left: [Left: Right] [Right: :] Assessed: [Left: No] [Right: No] Edema: [Left: Ye] [Right: s] Calf Left: Right: Point of Measurement: From Medial Instep 35 cm Ankle Left: Right: Point of Measurement: From Medial Instep 23 cm Vascular Assessment Pulses: Dorsalis Pedis Palpable: [Right:Yes] Electronic Signature(s) Signed: 07/02/2022 9:46:19 AM By: Carlene Coria RN Entered By: Carlene Coria on 07/01/2022 14:32:55 -------------------------------------------------------------------------------- Multi Wound Chart Details Patient Name: Date of Service: [name] TRICE 07/01/2022 2:00 PM Medical Record Number: [id number] Patient Account Number: [id number] Date of Birth/Sex: Treating RN: February 11, 1946 (76 y.o. [name] Primary Care Bridgid Printz: [name] Other Clinician: Referring Lucetta Baehr: Treating Eliberto Sole/Extender: Mayer Masker, Ngwe Weeks in Treatment: 58 Vital Signs Height(in): 69 Pulse(bpm): 88 Weight(lbs): 244 Blood Pressure(mmHg): 102/50 Body Mass Index(BMI): 36 Temperature(F): 98.1 Respiratory Rate(breaths/min): 18 [4:Photos:] [N/A:N/A] Right, Lateral Lower Leg N/A N/A Wound Location: Gradually Appeared N/A N/A Wounding  Event: Venous Leg Ulcer N/A N/A Primary Etiology: Cataracts, Lymphedema, Sleep N/A N/A Comorbid History: Apnea, Congestive Heart Failure, Hypertension, Osteoarthritis, Neuropathy, Received Chemotherapy, Received Radiation 10/27/2021 N/A N/A Date Acquired: 15 N/A N/A Weeks of Treatment: Open N/A N/A Wound StatusBRAIDEN, PRESUTTI ([id number]) [id number] Page 5 of 8 No N/A N/A Wound Recurrence: 5x2.5x0.1 N/A N/A Measurements L x W x D (cm) 9.817 N/A N/A A (cm) : rea 0.982 N/A N/A Volume (cm) : -733.40% N/A N/A % Reduction in Area: -732.20% N/A N/A % Reduction in Volume: Full Thickness Without Exposed N/A N/A Classification: Support Structures Medium N/A N/A Exudate Amount: Serosanguineous N/A N/A Exudate Type: red, brown N/A N/A Exudate Color: Medium (34-66%) N/A N/A Granulation Amount: Pink N/A N/A Granulation Quality: Medium (34-66%) N/A N/A Necrotic Amount: Fat  Layer (Subcutaneous Tissue): Yes N/A N/A Exposed Structures: Fascia: No Tendon: No Muscle: No Joint: No Bone: No None N/A N/A Epithelialization: Treatment Notes Electronic Signature(s) Signed: 07/02/2022 9:46:19 AM By: Carlene Coria RN Entered By: Carlene Coria on 07/01/2022 14:33:17 -------------------------------------------------------------------------------- [place] Details Patient Name: Date of Service: [name] TRICE 07/01/2022 2:00 PM Medical Record Number: [id number] Patient Account Number: [id number] Date of Birth/Sex: Treating RN: [birth year] (76 y.o. [name] Primary Care Carlisa Eble: [name] Other Clinician: Referring Darik Massing: Treating Taleya Whitcher/Extender: Mayer Masker, Ngwe Weeks in Treatment: 86 Active Inactive Electronic Signature(s) Signed: 07/02/2022 9:46:19 AM By: Carlene Coria RN Entered By: Carlene Coria on 07/01/2022  14:33:03 -------------------------------------------------------------------------------- Pain Assessment Details Patient Name: Date of Service: [name] TRICE 07/01/2022 2:00 PM [name] ([id number]) [id number] Page 6 of 8 Medical Record Number: [id number] Patient Account Number: [id number] Date of Birth/Sex: Treating RN: July 22, 1946 (76 y.o. [name] Primary Care Chayson Charters: [name] Other Clinician: Referring Jisele Price: Treating Doryce Mcgregory/Extender: Mayer Masker, Ngwe Weeks in Treatment: 21 Active Problems Location of Pain Severity and Description of Pain Patient Has Paino No Site Locations Pain Management and Medication Current Pain Management: Electronic Signature(s) Signed: 07/02/2022 9:46:19 AM By: Carlene Coria RN Entered By: Carlene Coria on 07/01/2022 14:31:05 -------------------------------------------------------------------------------- Patient/Caregiver Education Details Patient Name: Date of Service: [name] TRICE 10/24/2023andnbsp2:00 PM Medical Record Number: [id number] Patient Account Number: [id number] Date of Birth/Gender: Treating RN: Oct 12, 1945 (76 y.o. [name] Primary Care Physician: [name] Other Clinician: Referring Physician: Treating Physician/Extender: Alto [place] Weeks in Treatment: 78 Education Assessment Education Provided To: Patient Education Topics Provided Wound/Skin Impairment: Methods: Explain/Verbal Responses: State content correctly [employer]) Avilla, Alenah ([id number]) [id number] Page 7 of 8 Signed: 07/02/2022 9:46:19 AM By: Carlene Coria RN Entered By: Carlene Coria on 07/01/2022 15:21:50 -------------------------------------------------------------------------------- Wound Assessment Details Patient Name: Date of Service: [name] TRICE 07/01/2022 2:00 PM Medical Record Number: [id number] Patient  Account Number: [id number] Date of Birth/Sex: Treating RN: 22-Aug-1946 (76 y.o. [name] Primary Care Retina Bernardy: [name] Other Clinician: Referring Yardley Lekas: Treating Mang Hazelrigg/Extender: Mayer Masker, Ngwe Weeks in Treatment: 63 Wound Status Wound Number: 4 Primary Venous Leg Ulcer Etiology: Wound Location: Right, Lateral Lower Leg Wound Open Wounding Event: Gradually Appeared Status: Date Acquired: 10/27/2021 Comorbid Cataracts, Lymphedema, Sleep Apnea, Congestive Heart Failure, Weeks Of Treatment: 35 History: Hypertension, Osteoarthritis, Neuropathy, Received Chemotherapy, Clustered Wound: No Received Radiation Photos Wound Measurements Length: (cm) 5 Width: (cm) 2.5 Depth: (cm) 0.1 Area: (cm) 9.817 Volume: (cm) 0.982 % Reduction in Area: -733.4% % Reduction in Volume: -732.2% Epithelialization: None Tunneling: No Undermining: No Wound Description Classification: Full Thickness Without Exposed Support Exudate Amount: Medium Exudate Type: Serosanguineous Exudate Color: red, brown Structures Foul Odor After Cleansing: No Slough/Fibrino Yes Wound Bed Granulation Amount: Medium (34-66%) Exposed Structure Granulation Quality: Pink Fascia Exposed: No Necrotic Amount: Medium (34-66%) Fat Layer (Subcutaneous Tissue) Exposed: Yes Necrotic Quality: Adherent Slough Tendon Exposed: No Muscle Exposed: No Joint Exposed: No Bone Exposed: No Treatment Notes Wound #4 (Lower Leg) Wound Laterality: Right, Lateral Estrada, Dayjah ([id number]) [id number] Page 8 of 8 Cleanser Soap and Water Discharge Instruction: Gently cleanse wound with antibacterial soap, rinse and pat dry prior to dressing wounds Wound Cleanser Discharge Instruction: Wash your hands with soap and water. Remove old dressing, discard into plastic bag and place into trash. Cleanse the wound with Wound Cleanser prior to applying a clean dressing using gauze sponges, not  tissues or  cotton balls. Do not scrub or use excessive force. Pat dry using gauze sponges, not tissue or cotton balls. Peri-Wound Care Topical Primary Dressing Silvercel Small 2x2 (in/in) Discharge Instruction: Apply Silvercel Small 2x2 (in/in) as instructed Secondary Dressing ABD Pad 5x9 (in/in) Discharge Instruction: Cover with ABD pad Secured With Compression Wrap 3-LAYER WRAP - Profore Lite LF 3 Multilayer Compression Bandaging System Discharge Instruction: Apply 3 multi-layer wrap as prescribed. Compression Stockings Add-Ons Electronic Signature(s) Signed: 07/02/2022 9:46:19 AM By: Carlene Coria RN Entered By: Carlene Coria on 07/01/2022 14:31:53 -------------------------------------------------------------------------------- Vitals Details Patient Name: Date of Service: [name] TRICE 07/01/2022 2:00 PM Medical Record Number: [id number] Patient Account Number: [id number] Date of Birth/Sex: Treating RN: 08/19/1946 (76 y.o. [name] Primary Care Autum Benfer: [name] Other Clinician: Referring Amrita Radu: Treating Raydon Chappuis/Extender: Mayer Masker, Ngwe Weeks in Treatment: 25 Vital Signs Time Taken: 14:17 Temperature (F): 98.1 Height (in): 69 Pulse (bpm): 88 Weight (lbs): 244 Respiratory Rate (breaths/min): 18 Body Mass Index (BMI): 36 Blood Pressure (mmHg): 102/50 Reference Range: 80 - 120 mg / dl Electronic Signature(s) Signed: 07/02/2022 9:46:19 AM By: Carlene Coria RN Entered By: Carlene Coria on 07/01/2022 14:18:10

## 2022-07-03 ENCOUNTER — Encounter
Admission: RE | Admit: 2022-07-03 | Discharge: 2022-07-03 | Disposition: A | Discharge status: Home / Self Care | Payer: Medicare Other | Source: Ambulatory Visit | Attending: Family | Admitting: Family

## 2022-07-03 ENCOUNTER — Other Ambulatory Visit: Payer: Self-pay

## 2022-07-03 DIAGNOSIS — I5032 Chronic diastolic (congestive) heart failure: Secondary | ICD-10-CM | POA: Insufficient documentation

## 2022-07-03 DIAGNOSIS — Z08 Encounter for follow-up examination after completed treatment for malignant neoplasm: Secondary | ICD-10-CM

## 2022-07-03 LAB — BASIC METABOLIC PANEL
Anion gap: 9 (ref 5–15)
BUN: 46 mg/dL — ABNORMAL HIGH (ref 8–23)
CO2: 26 mmol/L (ref 22–32)
Calcium: 9.2 mg/dL (ref 8.9–10.3)
Chloride: 104 mmol/L (ref 98–111)
Creatinine, Ser: 1.57 mg/dL — ABNORMAL HIGH (ref 0.44–1.00)
GFR, Estimated: 34 mL/min — ABNORMAL LOW (ref >60)
Glucose, Bld: 92 mg/dL (ref 70–99)
Potassium: 4.2 mmol/L (ref 3.5–5.1)
Sodium: 139 mmol/L (ref 135–145)

## 2022-07-07 ENCOUNTER — Telehealth: Payer: Self-pay

## 2022-07-07 NOTE — Telephone Encounter (Addendum)
Spoke with patient's daughter, Governor Specking. Patient's daughter acknowledged lab results and provider message below. She states she is working on increasing her water intake and decreasing soda intake.  She states they have no concerns or questions at this time.  Georg Ruddle, RN ----- Message from Alisa Graff, [place] sent at 07/07/2022  8:32 AM EDT ----- Potassium is normal although kidney function has worsened. See if she's drinking her 60 ounces of fluid daily. Will recheck labs at next visit

## 2022-07-08 ENCOUNTER — Encounter: Payer: Medicare Other | Admitting: Physician Assistant

## 2022-07-08 DIAGNOSIS — L97812 Non-pressure chronic ulcer of other part of right lower leg with fat layer exposed: Secondary | ICD-10-CM | POA: Diagnosis not present

## 2022-07-08 NOTE — Progress Notes (Addendum)
[name], [name] ([id number]) [id number] Page 1 of 9 Visit Report for 07/08/2022 Arrival Information Details Patient Name: Date of Service: [name] 07/08/2022 2:00 PM Medical Record Number: [id number] Patient Account Number: [id number] Date of Birth/Sex: Treating RN: 1945/12/05 (76 y.o. [name] Primary Care [name]: [name] Other Clinician: Referring [name]: Treating [name]/Extender: [name], [name] in Treatment: 110 Visit Information History Since Last Visit All ordered tests and consults were completed: No Patient Arrived: Wheel Chair Added or deleted any medications: No Arrival Time: 14:04 Any new allergies or adverse reactions: No Accompanied By: self Had a fall or experienced change in No Transfer Assistance: None activities of daily living that may affect Patient Identification Verified: Yes risk of falls: Secondary Verification Process Completed: Yes Signs or symptoms of abuse/neglect since last visito No Patient Requires Transmission-Based Precautions: No Hospitalized since last visit: No Patient Has Alerts: Yes Implantable device outside of the clinic excluding No Patient Alerts: NOT DIABETIC cellular tissue based products placed in the center since last visit: Has Dressing in Place as Prescribed: Yes Has Compression in Place as Prescribed: Yes Pain Present Now: No Electronic Signature(s) Signed: 07/08/2022 3:14:27 PM By: [name] Coria RN Entered By: [name] on 07/08/2022 14:10:31 -------------------------------------------------------------------------------- Clinic Level of Care Assessment Details Patient Name: Date of Service: [name] 07/08/2022 2:00 PM Medical Record Number: [id number] Patient Account Number: [id number] Date of Birth/Sex: Treating RN: 1946-07-01 (76 y.o. [name] Primary Care [name]: [name] Other Clinician: Referring [name]: Treating  [name]/Extender: [name], [name] in Treatment: 73 Clinic Level of Care Assessment Items TOOL 1 Quantity Score '[]'$  - 0 Use when EandM and Procedure is performed on INITIAL visit ASSESSMENTS - Nursing Assessment / Reassessment '[]'$  - 0 General Physical Exam (combine w/ comprehensive assessment (listed just below) when performed on new pt. [address][name], Alean ([id number]) [id number] Page 2 of 9 '[]'$  - 0 Comprehensive Assessment (HX, ROS, Risk Assessments, Wounds Hx, etc.) ASSESSMENTS - Wound and Skin Assessment / Reassessment '[]'$  - 0 Dermatologic / Skin Assessment (not related to wound area) ASSESSMENTS - Ostomy and/or Continence Assessment and Care '[]'$  - 0 Incontinence Assessment and Management '[]'$  - 0 Ostomy Care Assessment and Management (repouching, etc.) PROCESS - Coordination of Care '[]'$  - 0 Simple Patient / Family Education for ongoing care '[]'$  - 0 Complex (extensive) Patient / Family Education for ongoing care '[]'$  - 0 Staff obtains Programmer, systems, Records, T Results / Process Orders est '[]'$  - 0 Staff telephones HHA, Nursing Homes / Clarify orders / etc '[]'$  - 0 Routine Transfer to another Facility (non-emergent condition) '[]'$  - 0 Routine Hospital Admission (non-emergent condition) '[]'$  - 0 New Admissions / Biomedical engineer / Ordering NPWT Apligraf, etc. , '[]'$  - 0 Emergency Hospital Admission (emergent condition) PROCESS - Special Needs '[]'$  - 0 Pediatric / Minor Patient Management '[]'$  - 0 Isolation Patient Management '[]'$  - 0 Hearing / Language / Visual special needs '[]'$  - 0 Assessment of Community assistance (transportation, D/C planning, etc.) '[]'$  - 0 Additional assistance / Altered mentation '[]'$  - 0 Support Surface(s) Assessment (bed, cushion, seat, etc.) INTERVENTIONS - Miscellaneous '[]'$  - 0 External ear exam '[]'$  - 0 Patient Transfer (multiple staff / Civil Service fast streamer / Similar devices) '[]'$  - 0 Simple Staple / Suture removal (25 or  less) '[]'$  - 0 Complex Staple / Suture removal (26 or more) '[]'$  - 0 Hypo/Hyperglycemic Management (do not check if billed separately) '[]'$  - 0 Ankle / Brachial Index (  ABI) - do not check if billed separately Has the patient been seen at the hospital within the last three years: Yes Total Score: 0 Level Of Care: ____ Electronic Signature(s) Unsigned Entered By: [name] on 07/08/2022 15:32:47 -------------------------------------------------------------------------------- Compression Therapy Details Patient Name: Date of Service: [name] 07/08/2022 2:00 PM Medical Record Number: [id number] Patient Account Number: [id number] [name], [name] ([id number]) [id number] Page 3 of 9 Date of Birth/Sex: Treating RN: 05/01/1946 (76 y.o. [name] Primary Care [name]: [name] Other Clinician: Referring [name]: Treating [name]/Extender: [name], [name] in Treatment: 38 Compression Therapy Performed for Wound Assessment: Wound #4 Right,Lateral Lower Leg Performed By: Clinician [name] Coria, RN Compression Type: Three Layer Post Procedure Diagnosis Same as Pre-procedure Electronic Signature(s) Signed: 07/08/2022 3:32:23 PM By: [name] Coria RN Entered By: [name] on 07/08/2022 15:32:23 -------------------------------------------------------------------------------- Compression Therapy Details Patient Name: Date of Service: [name] 07/08/2022 2:00 PM Medical Record Number: [id number] Patient Account Number: [id number] Date of Birth/Sex: Treating RN: [birth year] (76 y.o. [name] Primary Care Anjelique Makar: [name] Other Clinician: Referring Cambry Spampinato: Treating [name]/Extender: [name], [name] in Treatment: 22 Compression Therapy Performed for Wound Assessment: NonWound Condition Lymphedema - Left Leg Performed By: Clinician [name] Coria, RN Compression Type: Three Layer Post  Procedure Diagnosis Same as Pre-procedure Electronic Signature(s) Signed: 07/08/2022 3:32:38 PM By: [name] Coria RN Entered By: [name] on 07/08/2022 15:32:38 -------------------------------------------------------------------------------- Encounter Discharge Information Details Patient Name: Date of Service: [name] 07/08/2022 2:00 PM Medical Record Number: [id number] Patient Account Number: [id number] Date of Birth/Sex: Treating RN: 1946/06/24 (76 y.o. [name] Primary Care Sherry Blackard: [name] Other Clinician: Referring Delayna Sparlin: Treating Rayhana Slider/Extender: [name], [name] in Treatment: 56 Encounter Discharge Information Items Discharge Condition: Ethel, [name] ([id number]) [id number] Page 4 of 9 Ambulatory Status: Wheelchair Discharge Destination: Home Transportation: Private Auto Accompanied By: self Schedule Follow-up Appointment: Yes Clinical Summary of Care: Electronic Signature(s) Signed: 07/08/2022 3:39:51 PM By: [name] Coria RN Entered By: [name] on 07/08/2022 15:39:51 -------------------------------------------------------------------------------- Lower Extremity Assessment Details Patient Name: Date of Service: [name] 07/08/2022 2:00 PM Medical Record Number: [id number] Patient Account Number: [id number] Date of Birth/Sex: Treating RN: 05-28-1946 (76 y.o. [name] Primary Care Andriy Sherk: [name] Other Clinician: Referring Davied Nocito: Treating Amarea Macdowell/Extender: [name], [name] in Treatment: 31 Edema Assessment Assessed: [Left: No] [Right: No] Edema: [Left: Ye] [Right: s] Calf Left: Right: Point of Measurement: From Medial Instep 35 cm Ankle Left: Right: Point of Measurement: From Medial Instep 23 cm Vascular Assessment Pulses: Dorsalis Pedis Palpable: [Right:Yes] Electronic Signature(s) Signed: 07/08/2022 3:14:27 PM By: [name] Coria  RN Entered By: [name] on 07/08/2022 14:18:22 Multi Wound Chart Details -------------------------------------------------------------------------------- [name] ([id number]) [id number] Page 5 of 9 Patient Name: Date of Service: [name] 07/08/2022 2:00 PM Medical Record Number: [id number] Patient Account Number: [id number] Date of Birth/Sex: Treating RN: [birth year] (76 y.o. [name] Primary Care Jaclyne Haverstick: [name] Other Clinician: Referring Reeve Mallo: Treating Neela Zecca/Extender: [name], [name] in Treatment: 67 Vital Signs Height(in): 85 Pulse(bpm): 12 Weight(lbs): 244 Blood Pressure(mmHg): 139/69 Body Mass Index(BMI): 36 Temperature(F): 97.8 Respiratory Rate(breaths/min): 18 Wound Assessments Wound Number: 4 N/A N/A Photos: No Photos N/A N/A Right, Lateral Lower Leg N/A N/A Wound Location: Gradually Appeared N/A N/A Wounding Event: Venous Leg Ulcer N/A N/A Primary Etiology: Cataracts, Lymphedema, Sleep N/A N/A Comorbid History: Apnea, Congestive Heart Failure, Hypertension, Osteoarthritis,  Neuropathy, Received Chemotherapy, Received Radiation 10/27/2021 N/A N/A Date Acquired: 23 N/A N/A [name] of Treatment: Open N/A N/A Wound Status: No N/A N/A Wound Recurrence: 5x2x0.1 N/A N/A Measurements L x W x D (cm) 7.854 N/A N/A A (cm) : rea 0.785 N/A N/A Volume (cm) : -566.70% N/A N/A % Reduction in Area: -565.30% N/A N/A % Reduction in Volume: Full Thickness Without Exposed N/A N/A Classification: Support Structures Medium N/A N/A Exudate Amount: Serosanguineous N/A N/A Exudate Type: red, brown N/A N/A Exudate Color: Medium (34-66%) N/A N/A Granulation Amount: Pink N/A N/A Granulation Quality: Medium (34-66%) N/A N/A Necrotic Amount: Fat Layer (Subcutaneous Tissue): Yes N/A N/A Exposed Structures: Fascia: No Tendon: No Muscle: No Joint: No Bone: No None N/A  N/A Epithelialization: Treatment Notes Electronic Signature(s) Signed: 07/08/2022 3:14:27 PM By: [name] Coria RN Entered By: [name] on 07/08/2022 14:18:39 -------------------------------------------------------------------------------- [place] Details Patient Name: Date of Service: [name] 07/08/2022 2:00 PM Medical Record Number: [id number] Patient Account Number: [id number] Date of Birth/Sex: Treating RN: [birth year] (76 y.o. [name] Primary Care Jack Mineau: [name] Other Clinician: Referring Westlee Devita: Treating Dennys Guin/Extender: [name], [name] in Treatment: [address] [name] ([id number]) [id number] Page 6 of 9 Active Inactive Electronic Signature(s) Signed: 07/08/2022 3:14:27 PM By: [name] Coria RN Entered By: [name] on 07/08/2022 14:18:31 -------------------------------------------------------------------------------- Pain Assessment Details Patient Name: Date of Service: [name] 07/08/2022 2:00 PM Medical Record Number: [id number] Patient Account Number: [id number] Date of Birth/Sex: Treating RN: 1946/03/21 (76 y.o. [name] Primary Care Kagan Mutchler: [name] Other Clinician: Referring Matteson Blue: Treating Christien Berthelot/Extender: [name], [name] in Treatment: 60 Active Problems Location of Pain Severity and Description of Pain Patient Has Paino No Site Locations Pain Management and Medication Current Pain Management: Electronic Signature(s) Signed: 07/08/2022 3:14:27 PM By: [name] Coria RN Entered By: [name] on 07/08/2022 14:11:00 [name] ([id number]) [id number] Page 7 of 9 -------------------------------------------------------------------------------- Patient/Caregiver Education Details Patient Name: Date of Service: [name] 10/31/2023andnbsp2:00 PM Medical Record Number:  [id number] Patient Account Number: [id number] Date of Birth/Gender: Treating RN: [birth year] (76 y.o. [name] Primary Care Physician: [name] Other Clinician: Referring Physician: Treating Physician/Extender: [place] [name] in Treatment: 14 Education Assessment Education Provided To: Patient Education Topics Provided Wound/Skin Impairment: Methods: Explain/Verbal Responses: State content correctly Electronic Signature(s) Unsigned Entered By: [name] on 07/08/2022 15:39:16 -------------------------------------------------------------------------------- Wound Assessment Details Patient Name: Date of Service: [name] 07/08/2022 2:00 PM Medical Record Number: [id number] Patient Account Number: [id number] Date of Birth/Sex: Treating RN: Oct 01, 1945 (76 y.o. [name] Primary Care Marialena Wollen: [name] Other Clinician: Referring Suren Payne: Treating Yao Hyppolite/Extender: [name], [name] in Treatment: 61 Wound Status Wound Number: 4 Primary Venous Leg Ulcer Etiology: Wound Location: Right, Lateral Lower Leg Wound Open Wounding Event: Gradually Appeared Status: Date Acquired: 10/27/2021 Comorbid Cataracts, Lymphedema, Sleep Apnea, Congestive Heart Failure, [name] Of Treatment: 36 History: Hypertension, Osteoarthritis, Neuropathy, Received Chemotherapy, Clustered Wound: No Received Radiation Wound Measurements Length: (cm) 5 Width: (cm) 2 Depth: (cm) 0.1 Frangos, [name] ([id number]) Area: (cm) Volume: (cm) % Reduction in Area: -566.7% % Reduction in Volume: -565.3% Epithelialization: None [id number] Page 8 of 9 7.854 Tunneling: No 0.785 Undermining: No Wound Description Classification: Full Thickness Without Exposed Support Structures Exudate Amount: Medium Exudate Type: Serosanguineous Exudate Color: red, brown Foul Odor After Cleansing: No Slough/Fibrino Yes Wound  Bed Granulation Amount: Medium (34-66%) Exposed Structure Granulation Quality: Pink Fascia  Exposed: No Necrotic Amount: Medium (34-66%) Fat Layer (Subcutaneous Tissue) Exposed: Yes Necrotic Quality: Adherent Slough Tendon Exposed: No Muscle Exposed: No Joint Exposed: No Bone Exposed: No Treatment Notes Wound #4 (Lower Leg) Wound Laterality: Right, Lateral Cleanser Soap and Water Discharge Instruction: Gently cleanse wound with antibacterial soap, rinse and pat dry prior to dressing wounds Wound Cleanser Discharge Instruction: Wash your hands with soap and water. Remove old dressing, discard into plastic bag and place into trash. Cleanse the wound with Wound Cleanser prior to applying a clean dressing using gauze sponges, not tissues or cotton balls. Do not scrub or use excessive force. Pat dry using gauze sponges, not tissue or cotton balls. Peri-Wound Care Topical Primary Dressing Silvercel Small 2x2 (in/in) Discharge Instruction: Apply Silvercel Small 2x2 (in/in) as instructed Secondary Dressing ABD Pad 5x9 (in/in) Discharge Instruction: Cover with ABD pad Secured With Compression Wrap 3-LAYER WRAP - Profore Lite LF 3 Multilayer Compression Bandaging System Discharge Instruction: Apply 3 multi-layer wrap as prescribed. Compression Stockings Add-Ons Electronic Signature(s) Signed: 07/08/2022 3:14:27 PM By: [name] Coria RN Entered By: [name] on 07/08/2022 14:17:42 -------------------------------------------------------------------------------- Vitals Details Patient Name: Date of Service: [name] 07/08/2022 2:00 PM Medical Record Number: [id number] Patient Account Number: [id number] Date of Birth/Sex: Treating RN: [birth year] (76 y.o. [name] Primary Care Shatori Bertucci: [name] Other Clinician: BASIL, BLAKESLEY ([id number]) [id number] Page 9 of 9 Referring Amore Ackman: Treating Lillianna Sabel/Extender: [name],  [name] in Treatment: 42 Vital Signs Time Taken: 14:10 Temperature (F): 97.8 Height (in): 69 Pulse (bpm): 57 Weight (lbs): 244 Respiratory Rate (breaths/min): 18 Body Mass Index (BMI): 36 Blood Pressure (mmHg): 139/69 Reference Range: 80 - 120 mg / dl Electronic Signature(s) Signed: 07/08/2022 3:14:27 PM By: [name] Coria RN Entered By: [name] on 07/08/2022 14:10:50

## 2022-07-08 NOTE — Progress Notes (Addendum)
[name], [name] ([id number]) [id number] Page 1 of 13 Visit Report for 07/08/2022 Chief Complaint Document Details Patient Name: Date of Service: [name] 07/08/2022 2:00 PM Medical Record Number: [id number] Patient Account Number: [id number] Date of Birth/Sex: Treating RN: 1945/09/20 (76 y.o. [name] Primary Care Provider: [name] Other Clinician: Referring Provider: Treating Provider/Extender: Mayer Masker, Ngwe Weeks in Treatment: 13 Information Obtained from: Patient Chief Complaint Bilateral LE Ulcers Electronic Signature(s) Signed: 07/08/2022 2:26:23 PM By: Worthy Keeler PA-C Entered By: Worthy Keeler on 07/08/2022 14:26:23 -------------------------------------------------------------------------------- HPI Details Patient Name: Date of Service: [name] 07/08/2022 2:00 PM Medical Record Number: [id number] Patient Account Number: [id number] Date of Birth/Sex: Treating RN: 08-03-1946 (76 y.o. [name] Primary Care Provider: [name] Other Clinician: Referring Provider: Treating Provider/Extender: Mayer Masker, Ngwe Weeks in Treatment: 63 History of Present Illness HPI Description: 03/15/2020 upon evaluation today patient presents today for initial evaluation here in our clinic concerning a wound that is on the left posterior lower extremity. Unfortunately this has been giving the patient some discomfort at this point which she notes has been affected her pretty much daily. The patient does have a history of venous insufficiency, hypertension, congestive heart failure, and a history of breast cancer. Fortunately there does not appear to be evidence of active infection at this time which is great news. No fevers, chills, nausea, vomiting, or diarrhea. Wound is not extremely large but does have some slough covering the surface of the wound. This is going require sharp debridement  today. 03/15/2020 upon evaluation today patient appears to be doing fairly well in regard to her wound. She does have some slough noted on the surface of the wound currently but I do believe the Iodoflex has been beneficial over the past week. There is no signs of active infection at this time which is great news and overall very pleased with the progress. No fevers, chills, nausea, vomiting, or diarrhea. 04/02/2020 upon evaluation today patient appears to be doing a little better in regard to her wound size wise this is not tremendously smaller she does have some slough buildup on the surface of the wound. With that being said this is going require some sharp debridement today to clear away some of the necrotic debris. Fortunately there is no evidence of active infection at this time. No fevers, chills, nausea, vomiting, or diarrhea. 04/10/2020 on evaluation today patient appears to be doing well with regard to her ulcer which is measuring somewhat smaller today. She actually has more granulation tissue noted which is also good news is no need for sharp debridement today. Fortunately there is no evidence of infection either which is also excellent. No fevers, chills, nausea, vomiting, or diarrhea. [name], [name] ([id number]) [id number] Page 2 of 13 04/26/2020 on evaluation today patient's wound actually is showing signs of good improvement which is great news. There does not appear to be any evidence of active infection. I do believe she is tolerating the collagen at this point. 05/03/2020 upon evaluation today patient's wound actually showed signs of good granulation at this time there does not appear to be any evidence of active infection which is great news and overall very pleased with where things stand. With that being said I do believe that the patient is can require some sharp debridement today but fortunately nothing too significant. 05/11/20 on evaluation today  patient appears to be doing well in regard to her leg ulcer. She's been tolerating  the dressing changes without complication. Fortunately there is no signs of active infection at this time. Overall I'm very pleased with where things stand. 05/18/2020 upon evaluation today patient's wound is showing signs of improvement is very dry and I think the collagen for the most part just dried to the wound bed. I am going to clean this away with sharp debridement in order to get this under control in my opinion. 05/25/2020 upon evaluation today patient actually appears to be doing well in regard to her leg ulcer. In fact upon inspection it appears she could potentially be completely healed but that is not guaranteed based on what we are seeing. There does not appear to be any signs of active infection which is great news. 05/31/2020 on evaluation today patient appears to be doing well with regard to her wounds. She in fact appears to be almost completely healed on the left leg there is just a very small opening remaining point 1 06/08/2020 upon evaluation today patient appears to be doing excellent in regard to her leg ulcer on the left in fact it appears to be that she is completely healed as of today. I see no signs of active infection at this time which is great news. No fevers, chills, nausea, vomiting, or diarrhea. READMISSION 01/16/2021 Patient that we have had in this clinic with a wound on the left posterior calf in the summer 2021 extending into October. She was felt to have chronic venous insufficiency. Per the patient's description she was discharged with what sounds like external compression stockings although she could not afford the "$75 per leg". She therefore did not get anything. Apparently her wound that she has currently is been in existence since January. She has been followed at vein and vascular with Unna boots and I think calcium alginate. She had ABIs done on 06/25/2020 that showed an  noncompressible ABI on the right leg and the left leg but triphasic waveforms with good great toe pressures and a TBI of 0.90 on the right and 0.91 on the left Surprisingly the patient also had venous reflux studies that were really unremarkable. This included no evidence of DVT or SVT but there was no evidence of s s deep venous insufficiency or superficial venous insufficiency in the greater saphenous or short saphenous veins bilaterally. I would wonder about more central venous issues or perhaps this is all lymphedema 01/25/2021 upon evaluation today patient appears to be doing decently well and guard to her wound. The good news is the Iodoflex that is job she saw Dr. Dellia Nims last week for readmission and to be honest I think that this has done extremely well over that week. I do believe that she can tolerate the sharp debridement today which will be good. I think that cleaning off the wound will likely allow [place] to be able to use a different type of dressing I do not think the Iodoflex will even be necessary based on how clean the wound looks. Fortunately there is no sign of active infection at this time which is great news. No fevers, chills, nausea, vomiting, or diarrhea. 02/12/2021 upon evaluation today patient appears to be doing well with regard to her leg ulcer. We did switch to alginate when she came for nurse visit I think is doing much better for her. Fortunately there does not appear to be any signs of active infection at this time which is great news. No fevers, chills, nausea, vomiting, or diarrhea. 02/18/2021 upon evaluation today patient's wound  is actually showing signs of good improvement. I am very pleased with where things stand currently. I do not see any signs of active infection which is great news and overall I feel like she is making good progress. The patient likewise is happy that things are doing so well and that this is measuring somewhat smaller. 02/25/2021 upon evaluation  today patient actually appears to be doing quite well in regard to her wounds. She has been tolerating the dressing changes without complication. Fortunately there does not appear to be any signs of active infection which is great news and overall I am extremely pleased with where things stand. No fevers, chills, nausea, vomiting, or diarrhea. She does have a lot of edema I really do think she needs compression socks to be worn in order to prevent things from causing additional issues for her currently. Especially on the right leg she should be wearing compression socks all the time to be honest. 03/04/2021 upon evaluation today patient's leg ulcer actually appears to be doing pretty well which is great news. There does not appear to be any significant change but overall the appearance is better even though the size is not necessarily reflecting a great improvement. Fortunately there does not appear to be any signs of active infection. No fevers, chills, nausea, vomiting, or diarrhea. 03/12/2021 upon evaluation today patient appears to be doing well with regard to her wounds. She has been tolerating the dressing changes without complication. The good news is that the wound on her left lateral leg is doing much better and is showing signs of good improvement. Overall her swelling is controlled well as well. She does need some compression socks she plans to go Jefferey socks next week. 03/18/2021 on evaluation today patient's wound actually is showing signs of good improvement. She does have a little bit of slough this can require some sharp debridement today. 03/25/2021 upon evaluation today patient appears to be doing well with regard to her wound on the left lateral leg. She has been tolerating the dressing changes without complication. Fortunately there does not appear to be any signs of active infection at this time. No fever chills no 04/01/2021 upon evaluation today patient's wound is showing signs of  improvement and overall very pleased with where things stand at this point. There is no evidence of active infection which is great news as well and in general I think that she is making great progress. 04/09/2021 upon evaluation today patient appears to be doing well with regard to her ankle ulcer. She is making good progress currently which is great news. There does not appear to be any signs of infection also excellent news. In general I am extremely pleased with where things stand today. No fevers, chills, nausea, vomiting, or diarrhea. 04/23/2021 upon inspection today patient appears to be doing quite well with regard to her leg ulcer. She has been tolerating the dressing changes without complication. She is can require some sharp debridement today but overall seems to be doing excellent. 05/06/2021 upon evaluation today patient unfortunately appears to be doing poorly overall in regard to her swelling. She is actually significantly more swollen than last week despite the fact that her wound is doing better this is not a good trend. I think that she probably needs to see her cardiologist ASAP and I discussed that with her today. This includes the fact that honestly I think she probably is getting need an increase in her diuretic or something to try to clear  away some of the excess fluid that she is experiencing at the moment. She is not been doing her pumps even twice a day but again right now more concerned that if she were to do the pump she would fluid overload her lungs causing other issues as far as her breathing is concerned. Obviously I think she needs to contact them and move up the appointment for the 12th of something sooner 2 weeks is a bit too long to wait with how swollen she has. 05/14/2021 upon evaluation today patient's wound actually showing signs of being a little bit smaller compared to previous. She has been making good progress however. Fortunately there does not appear to be any  evidence of active infection which is great. Overall I am extremely pleased in that regard. Nonetheless I am still concerned that she is having quite a bit of issue as far as the swelling is concerned she has been placed on fluid restriction as well as an increase in the furosemide by her doctor. We will see how things progress. 05/21/2021 upon evaluation today patient appears to be doing well with regard to her wound in general. She has been tolerating the dressing changes without [name], [name] ([id number]) [id number] Page 3 of 13 complication. With that being said she has been using silver cell for a while and while this was showing signs of improvement it is now gotten to the point where this has slowed down quite significantly. I do believe that switching to a different dressing may be beneficial for her today. 05/28/2021 upon evaluation today patient actually appears to be doing quite well in regard to her wound. In fact there is really not any need for sharp debridement today based on what I see. The Hydrofera Blue is doing great and overall I think that she is managing quite nicely with the compression wrapping. Her edema is still down quite a bit with the wrapping in place. 06/11/2021 upon evaluation today patient appears to be doing well with regard to her leg ulcer. She has been tolerating the dressing changes without complication. Fortunately there does not appear to be any signs of active infection at this time which is great news. No fevers, chills, nausea, vomiting, or diarrhea. She is going require some sharp debridement today. 10/11; left leg ulcer much better looking and with improved surface area. She is using Hydrofera Blue under 3 layer compression. She does not have any stocking on the right leg which in itself has very significant nonpitting edema 06/25/2021 upon evaluation today patient appears to be doing well with regard to the wound on her left  leg. She has been tolerating the dressing changes without complication and overall I am extremely pleased with where things stand today. I do not see any evidence of infection which is great news. 07/02/2021 upon evaluation today patient's wound actually showing signs of excellent improvement and actually very pleased with where we stand today and the overall appearance. Fortunately there does not appear to be any signs of active infection. No fevers, chills, nausea, vomiting, or diarrhea. 07/09/2021 upon evaluation today patient appears to be doing better in regard to her wound this is measuring smaller and she is headed in the appropriate direction based on what I am seeing currently. I do not see any evidence of active infection systemically which is great news. 07/16/2021 upon evaluation today patient appears to be doing pretty well currently in regard to her leg ulcer. This is measuring smaller and looking much better  this is great news. Fortunately there does not appear to be any evidence of active infection systemically which is great news as well. 07/23/2021 upon evaluation today patient's wound is actually showing signs of good improvement this is definitely measuring smaller. I do not see any signs of infection which is great news and overall very pleased in that regard. Overall I think that she is doing well with the [facility] 07/30/2020 upon evaluation today patient appears to be doing well in regard to her wound. She has been tolerating the dressing changes without complication think the Hydrofera Blue at this point is actually causing her to dry out a lot as far as the wound bed is concerned. Subsequently I think that we need to try to see what we can do to improve this. Fortunately there does not appear to be any evidence of active infection at this time which is great news. 08/06/2021 upon evaluation today patient appears to be doing well with regards to her wound. Is not measuring  significantly smaller initial inspection but upon closer inspection she has a lot of new skin growth across the central portion of the wound. I think will get very close to complete resolution. 08/13/2021 upon evaluation today patient appears to be doing well with regard to her wound. In fact this appears to be I believe healed although there is still some question as to whether it is completely so. I am concerned about the fact that to be honest she still has some evidence of dry skin that could be just trapping something underneath I still want to debride anything as I am afraid of causing some damage to the good skin for that reason I Georgina Peer probably monitor for 1 more week before completely closing everything out. 08/20/2021 upon evaluation today patient actually appears to be doing excellent. In fact she appears to be completely healed. This was the case last week as well we just wanted to make sure nothing reopened since she does not really have an ability to put on compression socks this is good to be something we need to make sure was well-healed. Nonetheless I think we have achieved that goal as of today. 12/27; this is a patient we discharged 2 weeks ago with wounds on her left lower leg laterally. She was supposed to get stockings and really did not get them. She developed blistering and reopening probably sometime late last week. She has 3 areas in the same area as previously. The mid calf dimensions on the left went from 39 to 51 cm today. She also has very significant edema on the right leg but as far as I am aware has not had wounds in this area 1/4; patient presents for follow-up. She has tolerated the compression wrap well. She has no issues or complaints today. 09/17/2021 upon evaluation today patient appears to be doing well with regard to her wounds. She has been tolerating the dressing changes without complication. Fortunately there does not appear to be any signs of active infection  at this time. No fevers, chills, nausea, vomiting, or diarrhea. 10/01/2021 upon evaluation today patient appears to be doing well with regard to her wound this is actually measuring better and looking smaller and I am very pleased in that regard. Fortunately I do not see any evidence of active infection locally nor systemically at this point. No fevers, chills, nausea, vomiting, or diarrhea. 10/08/2021 upon evaluation patient's wounds are actually showing some signs of improvement measuring a little bit smaller  and looking a little bit better. Overall I do not think there is any need for sharp debridement today which is good news. No fevers, chills, nausea, vomiting, or diarrhea. 10/14/2021 upon evaluation today patient appears to be doing well with regard to her wound. She has been tolerating the dressing changes without complication and overall I am extremely pleased with where things stand today. There is a little bit of film on the surface of the wounds but this was carefully cleaned away with just saline and gauze she really did not want me to do any sharp debridement today. 10/29/2021 upon evaluation today patient appears to be doing quite well in regard to her wounds. She is actually showing signs of excellent improvement which is great news and overall I feel like we are on the right track at this time. She does have a wound on both legs and she does need something compression wise when she heals for that reason we will get a go and see about ordering her bilateral juxta lite compression wraps. 11/05/2021 upon evaluation today patient appears to be doing well with regard to her wound. She has been tolerating the dressing changes without complication. Fortunately I do not see any evidence of active infection locally nor systemically at this time which is great news. No fevers, chills, nausea, vomiting, or diarrhea. 11/12/2021 upon evaluation today patient appears to be doing well with regard to her  wounds. She is doing excellent on the left on the right this is not doing quite as well there is some slough and biofilm buildup. 3/14; patient presents for follow-up. She has no issues or complaints today. She has tolerated the compression wraps well. 11/26/2021 upon evaluation today patient appears to be doing well with regard to her wounds. Both are showing signs of good improvement which is great news. I do not see any evidence of active infection and overall think she is healing quite nicely. 12/03/2021 upon evaluation today patient appears to be doing well with regard to her wounds. Both are showing signs of excellent improvement I am very pleased with where things stand and I think that she is making good progress here. Fortunately I do not see any signs of active infection locally or systemically which is great news. No fevers, chills, nausea, vomiting, or diarrhea. 12-10-2021 upon evaluation today patient's wounds actually appear to be doing awesome. I am extremely pleased with where we stand I think she is making excellent progress. I do not see any signs of active infection locally or systemically at this time. 12-17-2021 upon evaluation today patient's wounds are actually showing signs of good improvement. Fortunately I do not see any evidence of active infection locally nor systemically which is great news. Overall I do not believe that she is showing any signs of significant worsening which is good news. With that being said I do believe that she has a little bit of film on the surface of the wound I was actually able to clear this away with saline and gauze no sharp debridement was necessary. [name], [name] ([id number]) [id number] Page 4 of 13 12-24-2021 upon evaluation today patient appears to be doing well with regard to her wounds. I do not see any signs of active infection currently which is great news and overall I think that we are on the right track  here. No fevers, chills, nausea, vomiting, or diarrhea. 12-31-2021 upon evaluation today patient appears to be doing well with regard to her wounds. Both are showing signs  of improvement the wound on the right leg is going require some sharp debridement on the left seems to be doing quite well. 01-07-2022 upon evaluation today patient appears to be doing well currently in regard to her wounds. She is actually making good progress bilaterally. Both of them require little bit of sharp debridement but not too much which is great news. Upon inspection patient's wound bed actually showed signs of good granulation and epithelization on both legs. She still is having significant issues with the right leg compared to the left although I do feel like both are actually showing signs of pretty good improvement. 01-21-2022 upon evaluation today patient's wound is actually showing signs of excellent improvement I am very pleased with where we stand currently. I do not see any signs of active infection locally or systemically which is great news. No fevers, chills, nausea, vomiting, or diarrhea. 01-28-2022 upon evaluation today patient appears to be doing well with regard to her wound on the left leg this is pretty much just about healed which is great news. Unfortunately in regard to the wound on her right leg this is not doing nearly as well in fact I think we probably need to see about a culture today that was discussed with her. 02-04-2022 upon evaluation today patient appears to be doing well with regard to her left leg which in fact might actually be completely healed I am not 100% sure. Nonetheless this is shown signs of excellent improvement which is great news. With that being said in regards to the right leg there is some slough and film buildup on the surface of the wound although she is feeling much better than last week this does appear to be doing much better than it was last week. Fortunately there does  not appear to be any signs of active infection locally or systemically at this time. 02-11-2022 upon evaluation today patient's wounds appear to be doing decently well. In fact the left leg is healed although there is brand-new skin were definitely given still need to wrap her for the time being. On the right leg this is showing signs of some epithelial growth in the middle of the wound it still measures the same because there is still a larger area with speckled openings but again as far as the entire area being open this is significantly improved which is great news. I am very pleased. 6/13; left leg remains healed and she has a juxta light to apply to it today. There is some concern about whether she is going to be able to do this at home. She lives with a daughter although she has her own disability. On the right the wound looks smaller to me nice epithelialization good edema control. 02-25-2022 upon evaluation today patient appears to be doing well with regard to her legs the left leg is still healed the right leg is doing much better. I think we are on the right track here. With that being said unfortunately she continues to have significant issues with some other problems and she actually tells me on Saturday she was having lunch and went inside to sit down when she tells me she had all of a sudden a sharp sensation that went from her hand through her arm into her leg and her neck and she tells me that she could not see anything for the next hour it was completely black. Slowly after this her vision started to return and has been normal since. It sounds  to me as if she may have had a TIA or mini stroke at this point. Nonetheless I think that she needs to have this checked out ASAP. Her primary care provider is Dr. Clide Deutscher at [facility]. We will get a try to get in touch with him. 03-10-2022 upon evaluation today patient's wound actually showed signs of good improvement. She seems to be making  excellent progress here. I do not see any evidence of active infection locally or systemically which is great news. No fevers, chills, nausea, vomiting, or diarrhea. She was in the hospital since have seen her last she ended up finding out that she was having syncopal episodes. That is what was going on with the blacking out and losing vision. Subsequently they adjusted some of her medication she seems to be doing better. 03-18-2022 upon evaluation today patient's wound is actually showing signs of good granulation and epithelization at this point. Fortunately I do not see any signs of active infection locally or systemically which is great news. 03-25-2022 any evidence of active infection locally or systemically which is great news. Any evidence of active infection which is great news and overall the wound is looking better. Upon evaluation today patient appears to be doing well with regard to her leg ulcer. She has been tolerating the dressing changes without complication. Fortunately I do not see 7/25; the patient's wound is on the right lateral lower extremity using silver alginate 3 layer compression. She has chronic venous insufficiency and lymphedema. We are using a juxta lite on the left leg which does not have any open wounds but she is leaving this on all week it seems that there just is not a way to get this changed at home 04-08-2022 upon evaluation today patient appears to be doing well currently in regard to her wound. She has been tolerating the dressing changes without complication. Fortunately I do not see any evidence of active infection locally or systemically which is great news. No fever or chills noted her left leg has reopened at this point. 04-15-2022 upon evaluation patient's wounds actually are showing signs of improvement which is great news. Little by little I do believe her making progress here. In regard to the right leg this is looking better though the measurement does not  speak to it there is a lot of new skin growth. On the left leg this is significantly smaller compared to last week most of the blistered area has completely closed. 04-22-2022 upon evaluation today patient appears to be doing excellent in regard to her wounds both are showing signs of improvement. Her knees and above are still extremely swollen the legs were rewrapped them are down significantly. With that being said I do not think that we will get a be able to keep them that way once were not rapid noted that she really has no way to be able to put juxta lites on and off and nobody to help her with that. That is the biggest concern that we have here at this point. Fortunately I do not think that there is any evidence of infection which is great news. 04-29-2022 upon evaluation today patient appears to be doing well with regard to her wounds. She has been tolerating the dressing changes without complication. Fortunately there does not appear to be any evidence of infection locally or systemically which is great news and overall I am extremely pleased in that regard. Left leg is healed the right leg is very close we discussed  today the possibility of her granddaughter helping with putting on and off compression wraps for her. 05-06-2022 upon evaluation today patient appears to be doing well currently in regard to her left leg which is showing signs of being completely healed. With regard to her right leg she still has an open area though this is doing much better there is a lot less drainage than what we previously noted. Fortunately I see no evidence of active infection locally or systemically at this time which is great news. 05-20-2022 upon evaluation today patient appears to be doing excellent in regard to her leg. The left leg is still healed the right leg is much better. Fortunately I see no signs of infection locally or systemically at this time which is great news. No fevers, chills, nausea,  vomiting, or diarrhea. 05-27-2022 upon evaluation today patient appears to be doing excellent in regard to her wound this is actually measuring smaller and looking much better. Fortunately I see no signs of active infection locally or systemically at this time. 06-03-2022 upon evaluation patient's wound actually is measuring smaller and looking much better. She still has a lot of discomfort but fortunately nothing to significant at this point which is great news. No fevers, chills, nausea, vomiting, or diarrhea. I think we are making some pretty good progress here. 06-10-2022 upon evaluation today patient appears to be doing well currently in regard to her wound which is actually measuring somewhat smaller. This is actually 2 separate wounds that are clustered and this is making it appear little bit larger than what it is but nonetheless we are seeing some definite improvement. Fortunately I do not see any signs of active infection at this time. No fevers, chills, nausea, vomiting, or diarrhea. 06-17-2022 upon evaluation today patient appears to be doing well currently in regard to her wound although she was in the hospital since she was last here. [name], [name] ([id number]) [id number] Page 5 of 13 That was from 06-12-2022 through 06-16-2022 which was yesterday. This was due to a CHF exacerbation. With that being said the patient has noted in her record review acute on chronic congestive heart failure with hypertensive emergency noted. She also has stage IIIa chronic kidney disease noted. With that being said I do think her wound is much better because I got a lot of fluid off to try to diurese her to a degree but her kidneys would not hold for that they put her on furosemide 40 mg and that is helping to get the fluid off she is much better but still has a lot of fluid buildup. 06-24-2022 upon evaluation today patient appears to be doing well currently in regard to her wound  is not significantly smaller but it is improved overall. Fortunately I do not see any signs of active infection locally nor systemically at this time. 07-01-2022 upon evaluation today patient appears to be doing okay in regard to her wound I do not believe the silver alginate is helping quite as much as I would like to see. I would actually recommend we switch back over to the [facility] at this point which I think may do better. Fortunately I do not see any signs of infection which is good news were going to switch to Tubigrip on the left leg on the right leg we will get a continue with the compression wrap. 07-08-2022 upon evaluation today patient unfortunately is not doing nearly as well in regard to her left leg. We had to put her  back into a 4-layer compression wrap today due to a significant blister that appeared. I do believe that we need to try to get this to reabsorb we will see how things do over the next week. With that being said the right leg is doing well the 4-layer compression wrap and switch to the [facility] [place] seems to be very beneficial I am pleased with what we are seeing in that regard. Electronic Signature(s) Signed: 07/08/2022 2:43:18 PM By: Worthy Keeler PA-C Entered By: Worthy Keeler on 07/08/2022 14:43:17 -------------------------------------------------------------------------------- Physical Exam Details Patient Name: Date of Service: [name] 07/08/2022 2:00 PM Medical Record Number: [id number] Patient Account Number: [id number] Date of Birth/Sex: Treating RN: 1946/04/11 (76 y.o. [name] Primary Care Provider: [name] Other Clinician: Referring Provider: Treating Provider/Extender: Mayer Masker, Ngwe Weeks in Treatment: 69 Constitutional Well-nourished and well-hydrated in no acute distress. Respiratory normal breathing without difficulty. Psychiatric this patient is able to make decisions and demonstrates good insight  into disease process. Alert and Oriented x 3. pleasant and cooperative. Notes Upon inspection patient's wound bed actually showed signs of good granulation and epithelization at this point. Fortunately I do not see any evidence of infection locally or systemically which is great news and overall I am extremely pleased with where things stand today. Electronic Signature(s) Signed: 07/08/2022 2:43:33 PM By: Worthy Keeler PA-C Entered By: Worthy Keeler on 07/08/2022 14:43:33 Physician Orders Details -------------------------------------------------------------------------------- [name] ([id number]) [id number] Page 6 of 13 Patient Name: Date of Service: [name] 07/08/2022 2:00 PM Medical Record Number: [id number] Patient Account Number: [id number] Date of Birth/Sex: Treating RN: [birth year] (76 y.o. [name] Primary Care Provider: [name] Other Clinician: Referring Provider: Treating Provider/Extender: Mayer Masker, Ngwe Weeks in Treatment: 42 Verbal / Phone Orders: No Diagnosis Coding Follow-up Appointments Return Appointment in 1 week. Nurse Visit as needed Bathing/ Shower/ Hygiene May shower with wound dressing protected with water repellent cover or cast protector. No tub bath. Anesthetic (Use 'Patient Medications' Section for Anesthetic Order Entry) Lidocaine applied to wound bed Edema Control - Lymphedema / Segmental Compressive Device / Other Bilateral Lower Extremities Optional: One layer of unna paste to top of compression wrap (to act as an anchor). 3 Layer Compression System for Lymphedema. - right left Elevate, Exercise Daily and A void Standing for Long Periods of Time. Elevate legs to the level of the heart and pump ankles as often as possible Elevate leg(s) parallel to the floor when sitting. Compression Pump: Use compression pump on left lower extremity for 60 minutes, twice daily. - You may pump  over wraps Compression Pump: Use compression pump on right lower extremity for 60 minutes, twice daily. - You may pump over wraps Wound Treatment Wound #4 - Lower Leg Wound Laterality: Right, Lateral Cleanser: Soap and Water 1 x Per Week/30 Days Discharge Instructions: Gently cleanse wound with antibacterial soap, rinse and pat dry prior to dressing wounds Cleanser: Wound Cleanser 1 x Per Week/30 Days Discharge Instructions: Wash your hands with soap and water. Remove old dressing, discard into plastic bag and place into trash. Cleanse the wound with Wound Cleanser prior to applying a clean dressing using gauze sponges, not tissues or cotton balls. Do not scrub or use excessive force. Pat dry using gauze sponges, not tissue or cotton balls. Prim Dressing: Silvercel Small 2x2 (in/in) 1 x Per Week/30 Days ary Discharge Instructions: Apply Silvercel Small 2x2 (in/in) as instructed Secondary Dressing: ABD  Pad 5x9 (in/in) 1 x Per Week/30 Days Discharge Instructions: Cover with ABD pad Compression Wrap: 3-LAYER WRAP - Profore Lite LF 3 Multilayer Compression Bandaging System 1 x Per Week/30 Days Discharge Instructions: Apply 3 multi-layer wrap as prescribed. Electronic Signature(s) Signed: 07/08/2022 3:14:27 PM By: Carlene Coria RN Signed: 07/08/2022 4:04:00 PM By: Worthy Keeler PA-C Entered By: Carlene Coria on 07/08/2022 14:33:18 -------------------------------------------------------------------------------- Problem List Details Patient Name: Date of Service: [name] 07/08/2022 2:00 PM Medical Record Number: [id number] Patient Account Number: [id number] Date of Birth/Sex: Treating RN: 11/13/1945 (76 y.o. 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 01/16/2021 No Yes lower extremity L97.812 Non-pressure chronic ulcer of other part of right lower leg with fat layer 01/07/2022 No Yes exposed Inactive Problems ICD-10 Code Description Active Date Inactive Date L97.822 Non-pressure chronic ulcer of other part of left lower leg with fat layer exposed 01/16/2021 01/16/2021 Resolved Problems Electronic Signature(s) Signed: 07/08/2022 2:26:19 PM By: Worthy Keeler PA-C Entered By: Worthy Keeler on 07/08/2022 14:26:19 -------------------------------------------------------------------------------- Progress Note Details Patient Name: Date of Service: [name] 07/08/2022 2:00 PM Medical Record Number: [id number] Patient Account Number: [id number] Date of Birth/Sex: Treating RN: 03-07-1946 (76 y.o. [name] Primary Care Provider: [name] Other Clinician: Referring Provider: Treating Provider/Extender: Mayer Masker, Ngwe Weeks in Treatment: 44 Subjective Chief Complaint Information obtained from Patient Bilateral LE Ulcers History of Present Illness (HPI) 03/15/2020 upon evaluation today patient presents today for initial evaluation here in our clinic concerning a wound that is on the left posterior lower extremity. Unfortunately this has been giving the patient some discomfort at this point which she notes has been affected her pretty much daily. The patient does have a history of venous insufficiency, hypertension, congestive heart failure, and a history of breast cancer. Fortunately there does not appear to be evidence of active infection at this time which is great news. No fevers, chills, nausea, vomiting, or diarrhea. Wound is not extremely large but does have some slough covering the surface of  the wound. This is going require sharp debridement today. 03/15/2020 upon evaluation today patient appears to be doing fairly well in regard to her wound. She does have some slough noted on the surface of the wound currently but I do believe the Iodoflex has been beneficial over the past week. There is no signs of active infection at this time which is great news and overall very pleased with the progress. No fevers, chills, nausea, vomiting, or diarrhea. [name], [name] ([id number]) [id number] Page 8 of 13 04/02/2020 upon evaluation today patient appears to be doing a little better in regard to her wound size wise this is not tremendously smaller she does have some slough buildup on the surface of the wound. With that being said this is going require some sharp debridement today to clear away some of the necrotic debris. Fortunately there is no evidence of active infection at this time. No fevers, chills, nausea, vomiting, or diarrhea. 04/10/2020 on evaluation today patient appears to be doing well with regard to her ulcer which is measuring somewhat smaller today. She actually has more granulation tissue noted which is also good news is  no need for sharp debridement today. Fortunately there is no evidence of infection either which is also excellent. No fevers, chills, nausea, vomiting, or diarrhea. 04/26/2020 on evaluation today patient's wound actually is showing signs of good improvement which is great news. There does not appear to be any evidence of active infection. I do believe she is tolerating the collagen at this point. 05/03/2020 upon evaluation today patient's wound actually showed signs of good granulation at this time there does not appear to be any evidence of active infection which is great news and overall very pleased with where things stand. With that being said I do believe that the patient is can require some sharp debridement today but fortunately nothing  too significant. 05/11/20 on evaluation today patient appears to be doing well in regard to her leg ulcer. She's been tolerating the dressing changes without complication. Fortunately there is no signs of active infection at this time. Overall I'm very pleased with where things stand. 05/18/2020 upon evaluation today patient's wound is showing signs of improvement is very dry and I think the collagen for the most part just dried to the wound bed. I am going to clean this away with sharp debridement in order to get this under control in my opinion. 05/25/2020 upon evaluation today patient actually appears to be doing well in regard to her leg ulcer. In fact upon inspection it appears she could potentially be completely healed but that is not guaranteed based on what we are seeing. There does not appear to be any signs of active infection which is great news. 05/31/2020 on evaluation today patient appears to be doing well with regard to her wounds. She in fact appears to be almost completely healed on the left leg there is just a very small opening remaining point 1 06/08/2020 upon evaluation today patient appears to be doing excellent in regard to her leg ulcer on the left in fact it appears to be that she is completely healed as of today. I see no signs of active infection at this time which is great news. No fevers, chills, nausea, vomiting, or diarrhea. READMISSION 01/16/2021 Patient that we have had in this clinic with a wound on the left posterior calf in the summer 2021 extending into October. She was felt to have chronic venous insufficiency. Per the patient's description she was discharged with what sounds like external compression stockings although she could not afford the "$75 per leg". She therefore did not get anything. Apparently her wound that she has currently is been in existence since January. She has been followed at vein and vascular with Unna boots and I think calcium alginate. She had  ABIs done on 06/25/2020 that showed an noncompressible ABI on the right leg and the left leg but triphasic waveforms with good great toe pressures and a TBI of 0.90 on the right and 0.91 on the left Surprisingly the patient also had venous reflux studies that were really unremarkable. This included no evidence of DVT or SVT but there was no evidence of s s deep venous insufficiency or superficial venous insufficiency in the greater saphenous or short saphenous veins bilaterally. I would wonder about more central venous issues or perhaps this is all lymphedema 01/25/2021 upon evaluation today patient appears to be doing decently well and guard to her wound. The good news is the Iodoflex that is job she saw Dr. Dellia Nims last week for readmission and to be honest I think that this has done extremely well over  that week. I do believe that she can tolerate the sharp debridement today which will be good. I think that cleaning off the wound will likely allow [place] to be able to use a different type of dressing I do not think the Iodoflex will even be necessary based on how clean the wound looks. Fortunately there is no sign of active infection at this time which is great news. No fevers, chills, nausea, vomiting, or diarrhea. 02/12/2021 upon evaluation today patient appears to be doing well with regard to her leg ulcer. We did switch to alginate when she came for nurse visit I think is doing much better for her. Fortunately there does not appear to be any signs of active infection at this time which is great news. No fevers, chills, nausea, vomiting, or diarrhea. 02/18/2021 upon evaluation today patient's wound is actually showing signs of good improvement. I am very pleased with where things stand currently. I do not see any signs of active infection which is great news and overall I feel like she is making good progress. The patient likewise is happy that things are doing so well and that this is measuring  somewhat smaller. 02/25/2021 upon evaluation today patient actually appears to be doing quite well in regard to her wounds. She has been tolerating the dressing changes without complication. Fortunately there does not appear to be any signs of active infection which is great news and overall I am extremely pleased with where things stand. No fevers, chills, nausea, vomiting, or diarrhea. She does have a lot of edema I really do think she needs compression socks to be worn in order to prevent things from causing additional issues for her currently. Especially on the right leg she should be wearing compression socks all the time to be honest. 03/04/2021 upon evaluation today patient's leg ulcer actually appears to be doing pretty well which is great news. There does not appear to be any significant change but overall the appearance is better even though the size is not necessarily reflecting a great improvement. Fortunately there does not appear to be any signs of active infection. No fevers, chills, nausea, vomiting, or diarrhea. 03/12/2021 upon evaluation today patient appears to be doing well with regard to her wounds. She has been tolerating the dressing changes without complication. The good news is that the wound on her left lateral leg is doing much better and is showing signs of good improvement. Overall her swelling is controlled well as well. She does need some compression socks she plans to go Jefferey socks next week. 03/18/2021 on evaluation today patient's wound actually is showing signs of good improvement. She does have a little bit of slough this can require some sharp debridement today. 03/25/2021 upon evaluation today patient appears to be doing well with regard to her wound on the left lateral leg. She has been tolerating the dressing changes without complication. Fortunately there does not appear to be any signs of active infection at this time. No fever chills no 04/01/2021 upon  evaluation today patient's wound is showing signs of improvement and overall very pleased with where things stand at this point. There is no evidence of active infection which is great news as well and in general I think that she is making great progress. 04/09/2021 upon evaluation today patient appears to be doing well with regard to her ankle ulcer. She is making good progress currently which is great news. There does not appear to be any signs of infection also  excellent news. In general I am extremely pleased with where things stand today. No fevers, chills, nausea, vomiting, or diarrhea. 04/23/2021 upon inspection today patient appears to be doing quite well with regard to her leg ulcer. She has been tolerating the dressing changes without complication. She is can require some sharp debridement today but overall seems to be doing excellent. 05/06/2021 upon evaluation today patient unfortunately appears to be doing poorly overall in regard to her swelling. She is actually significantly more swollen than last week despite the fact that her wound is doing better this is not a good trend. I think that she probably needs to see her cardiologist ASAP and I discussed that with her today. This includes the fact that honestly I think she probably is getting need an increase in her diuretic or something to try to clear away some of the excess fluid that she is experiencing at the moment. She is not been doing her pumps even twice a day but again right now more concerned that if she were to do the pump she would fluid overload her lungs causing other issues as far as her breathing is concerned. Obviously I think she needs to [place] ([id number]) [id number] Page 9 of 13 contact them and move up the appointment for the 12th of something sooner 2 weeks is a bit too long to wait with how swollen she has. 05/14/2021 upon evaluation today patient's wound actually showing signs of  being a little bit smaller compared to previous. She has been making good progress however. Fortunately there does not appear to be any evidence of active infection which is great. Overall I am extremely pleased in that regard. Nonetheless I am still concerned that she is having quite a bit of issue as far as the swelling is concerned she has been placed on fluid restriction as well as an increase in the furosemide by her doctor. We will see how things progress. 05/21/2021 upon evaluation today patient appears to be doing well with regard to her wound in general. She has been tolerating the dressing changes without complication. With that being said she has been using silver cell for a while and while this was showing signs of improvement it is now gotten to the point where this has slowed down quite significantly. I do believe that switching to a different dressing may be beneficial for her today. 05/28/2021 upon evaluation today patient actually appears to be doing quite well in regard to her wound. In fact there is really not any need for sharp debridement today based on what I see. The Hydrofera Blue is doing great and overall I think that she is managing quite nicely with the compression wrapping. Her edema is still down quite a bit with the wrapping in place. 06/11/2021 upon evaluation today patient appears to be doing well with regard to her leg ulcer. She has been tolerating the dressing changes without complication. Fortunately there does not appear to be any signs of active infection at this time which is great news. No fevers, chills, nausea, vomiting, or diarrhea. She is going require some sharp debridement today. 10/11; left leg ulcer much better looking and with improved surface area. She is using Hydrofera Blue under 3 layer compression. She does not have any stocking on the right leg which in itself has very significant nonpitting edema 06/25/2021 upon evaluation today patient appears to  be doing well with regard to the wound on her left leg. She has been tolerating the dressing  changes without complication and overall I am extremely pleased with where things stand today. I do not see any evidence of infection which is great news. 07/02/2021 upon evaluation today patient's wound actually showing signs of excellent improvement and actually very pleased with where we stand today and the overall appearance. Fortunately there does not appear to be any signs of active infection. No fevers, chills, nausea, vomiting, or diarrhea. 07/09/2021 upon evaluation today patient appears to be doing better in regard to her wound this is measuring smaller and she is headed in the appropriate direction based on what I am seeing currently. I do not see any evidence of active infection systemically which is great news. 07/16/2021 upon evaluation today patient appears to be doing pretty well currently in regard to her leg ulcer. This is measuring smaller and looking much better this is great news. Fortunately there does not appear to be any evidence of active infection systemically which is great news as well. 07/23/2021 upon evaluation today patient's wound is actually showing signs of good improvement this is definitely measuring smaller. I do not see any signs of infection which is great news and overall very pleased in that regard. Overall I think that she is doing well with the [facility]. 07/30/2020 upon evaluation today patient appears to be doing well in regard to her wound. She has been tolerating the dressing changes without complication think the Hydrofera Blue at this point is actually causing her to dry out a lot as far as the wound bed is concerned. Subsequently I think that we need to try to see what we can do to improve this. Fortunately there does not appear to be any evidence of active infection at this time which is great news. 08/06/2021 upon evaluation today patient appears to be doing  well with regards to her wound. Is not measuring significantly smaller initial inspection but upon closer inspection she has a lot of new skin growth across the central portion of the wound. I think will get very close to complete resolution. 08/13/2021 upon evaluation today patient appears to be doing well with regard to her wound. In fact this appears to be I believe healed although there is still some question as to whether it is completely so. I am concerned about the fact that to be honest she still has some evidence of dry skin that could be just trapping something underneath I still want to debride anything as I am afraid of causing some damage to the good skin for that reason I Georgina Peer probably monitor for 1 more week before completely closing everything out. 08/20/2021 upon evaluation today patient actually appears to be doing excellent. In fact she appears to be completely healed. This was the case last week as well we just wanted to make sure nothing reopened since she does not really have an ability to put on compression socks this is good to be something we need to make sure was well-healed. Nonetheless I think we have achieved that goal as of today. 12/27; this is a patient we discharged 2 weeks ago with wounds on her left lower leg laterally. She was supposed to get stockings and really did not get them. She developed blistering and reopening probably sometime late last week. She has 3 areas in the same area as previously. The mid calf dimensions on the left went from 39 to 51 cm today. She also has very significant edema on the right leg but as far as I am aware  has not had wounds in this area 1/4; patient presents for follow-up. She has tolerated the compression wrap well. She has no issues or complaints today. 09/17/2021 upon evaluation today patient appears to be doing well with regard to her wounds. She has been tolerating the dressing changes without complication. Fortunately there  does not appear to be any signs of active infection at this time. No fevers, chills, nausea, vomiting, or diarrhea. 10/01/2021 upon evaluation today patient appears to be doing well with regard to her wound this is actually measuring better and looking smaller and I am very pleased in that regard. Fortunately I do not see any evidence of active infection locally nor systemically at this point. No fevers, chills, nausea, vomiting, or diarrhea. 10/08/2021 upon evaluation patient's wounds are actually showing some signs of improvement measuring a little bit smaller and looking a little bit better. Overall I do not think there is any need for sharp debridement today which is good news. No fevers, chills, nausea, vomiting, or diarrhea. 10/14/2021 upon evaluation today patient appears to be doing well with regard to her wound. She has been tolerating the dressing changes without complication and overall I am extremely pleased with where things stand today. There is a little bit of film on the surface of the wounds but this was carefully cleaned away with just saline and gauze she really did not want me to do any sharp debridement today. 10/29/2021 upon evaluation today patient appears to be doing quite well in regard to her wounds. She is actually showing signs of excellent improvement which is great news and overall I feel like we are on the right track at this time. She does have a wound on both legs and she does need something compression wise when she heals for that reason we will get a go and see about ordering her bilateral juxta lite compression wraps. 11/05/2021 upon evaluation today patient appears to be doing well with regard to her wound. She has been tolerating the dressing changes without complication. Fortunately I do not see any evidence of active infection locally nor systemically at this time which is great news. No fevers, chills, nausea, vomiting, or diarrhea. 11/12/2021 upon evaluation today  patient appears to be doing well with regard to her wounds. She is doing excellent on the left on the right this is not doing quite as well there is some slough and biofilm buildup. 3/14; patient presents for follow-up. She has no issues or complaints today. She has tolerated the compression wraps well. 11/26/2021 upon evaluation today patient appears to be doing well with regard to her wounds. Both are showing signs of good improvement which is great news. I do not see any evidence of active infection and overall think she is healing quite nicely. 12/03/2021 upon evaluation today patient appears to be doing well with regard to her wounds. Both are showing signs of excellent improvement I am very pleased with where things stand and I think that she is making good progress here. Fortunately I do not see any signs of active infection locally or systemically which is great news. No fevers, chills, nausea, vomiting, or diarrhea. [name], [name] ([id number]) [id number] Page 10 of 13 12-10-2021 upon evaluation today patient's wounds actually appear to be doing awesome. I am extremely pleased with where we stand I think she is making excellent progress. I do not see any signs of active infection locally or systemically at this time. 12-17-2021 upon evaluation today patient's wounds are actually showing signs  of good improvement. Fortunately I do not see any evidence of active infection locally nor systemically which is great news. Overall I do not believe that she is showing any signs of significant worsening which is good news. With that being said I do believe that she has a little bit of film on the surface of the wound I was actually able to clear this away with saline and gauze no sharp debridement was necessary. 12-24-2021 upon evaluation today patient appears to be doing well with regard to her wounds. I do not see any signs of active infection currently which is great news  and overall I think that we are on the right track here. No fevers, chills, nausea, vomiting, or diarrhea. 12-31-2021 upon evaluation today patient appears to be doing well with regard to her wounds. Both are showing signs of improvement the wound on the right leg is going require some sharp debridement on the left seems to be doing quite well. 01-07-2022 upon evaluation today patient appears to be doing well currently in regard to her wounds. She is actually making good progress bilaterally. Both of them require little bit of sharp debridement but not too much which is great news. Upon inspection patient's wound bed actually showed signs of good granulation and epithelization on both legs. She still is having significant issues with the right leg compared to the left although I do feel like both are actually showing signs of pretty good improvement. 01-21-2022 upon evaluation today patient's wound is actually showing signs of excellent improvement I am very pleased with where we stand currently. I do not see any signs of active infection locally or systemically which is great news. No fevers, chills, nausea, vomiting, or diarrhea. 01-28-2022 upon evaluation today patient appears to be doing well with regard to her wound on the left leg this is pretty much just about healed which is great news. Unfortunately in regard to the wound on her right leg this is not doing nearly as well in fact I think we probably need to see about a culture today that was discussed with her. 02-04-2022 upon evaluation today patient appears to be doing well with regard to her left leg which in fact might actually be completely healed I am not 100% sure. Nonetheless this is shown signs of excellent improvement which is great news. With that being said in regards to the right leg there is some slough and film buildup on the surface of the wound although she is feeling much better than last week this does appear to be doing much  better than it was last week. Fortunately there does not appear to be any signs of active infection locally or systemically at this time. 02-11-2022 upon evaluation today patient's wounds appear to be doing decently well. In fact the left leg is healed although there is brand-new skin were definitely given still need to wrap her for the time being. On the right leg this is showing signs of some epithelial growth in the middle of the wound it still measures the same because there is still a larger area with speckled openings but again as far as the entire area being open this is significantly improved which is great news. I am very pleased. 6/13; left leg remains healed and she has a juxta light to apply to it today. There is some concern about whether she is going to be able to do this at home. She lives with a daughter although she has her own  disability. On the right the wound looks smaller to me nice epithelialization good edema control. 02-25-2022 upon evaluation today patient appears to be doing well with regard to her legs the left leg is still healed the right leg is doing much better. I think we are on the right track here. With that being said unfortunately she continues to have significant issues with some other problems and she actually tells me on Saturday she was having lunch and went inside to sit down when she tells me she had all of a sudden a sharp sensation that went from her hand through her arm into her leg and her neck and she tells me that she could not see anything for the next hour it was completely black. Slowly after this her vision started to return and has been normal since. It sounds to me as if she may have had a TIA or mini stroke at this point. Nonetheless I think that she needs to have this checked out ASAP. Her primary care provider is Dr. Clide Deutscher at [facility]. We will get a try to get in touch with him. 03-10-2022 upon evaluation today patient's wound actually showed  signs of good improvement. She seems to be making excellent progress here. I do not see any evidence of active infection locally or systemically which is great news. No fevers, chills, nausea, vomiting, or diarrhea. She was in the hospital since have seen her last she ended up finding out that she was having syncopal episodes. That is what was going on with the blacking out and losing vision. Subsequently they adjusted some of her medication she seems to be doing better. 03-18-2022 upon evaluation today patient's wound is actually showing signs of good granulation and epithelization at this point. Fortunately I do not see any signs of active infection locally or systemically which is great news. 03-25-2022 any evidence of active infection locally or systemically which is great news. Any evidence of active infection which is great news and overall the wound is looking better. Upon evaluation today patient appears to be doing well with regard to her leg ulcer. She has been tolerating the dressing changes without complication. Fortunately I do not see 7/25; the patient's wound is on the right lateral lower extremity using silver alginate 3 layer compression. She has chronic venous insufficiency and lymphedema. We are using a juxta lite on the left leg which does not have any open wounds but she is leaving this on all week it seems that there just is not a way to get this changed at home 04-08-2022 upon evaluation today patient appears to be doing well currently in regard to her wound. She has been tolerating the dressing changes without complication. Fortunately I do not see any evidence of active infection locally or systemically which is great news. No fever or chills noted her left leg has reopened at this point. 04-15-2022 upon evaluation patient's wounds actually are showing signs of improvement which is great news. Little by little I do believe her making progress here. In regard to the right leg this  is looking better though the measurement does not speak to it there is a lot of new skin growth. On the left leg this is significantly smaller compared to last week most of the blistered area has completely closed. 04-22-2022 upon evaluation today patient appears to be doing excellent in regard to her wounds both are showing signs of improvement. Her knees and above are still extremely swollen the legs were  rewrapped them are down significantly. With that being said I do not think that we will get a be able to keep them that way once were not rapid noted that she really has no way to be able to put juxta lites on and off and nobody to help her with that. That is the biggest concern that we have here at this point. Fortunately I do not think that there is any evidence of infection which is great news. 04-29-2022 upon evaluation today patient appears to be doing well with regard to her wounds. She has been tolerating the dressing changes without complication. Fortunately there does not appear to be any evidence of infection locally or systemically which is great news and overall I am extremely pleased in that regard. Left leg is healed the right leg is very close we discussed today the possibility of her granddaughter helping with putting on and off compression wraps for her. 05-06-2022 upon evaluation today patient appears to be doing well currently in regard to her left leg which is showing signs of being completely healed. With regard to her right leg she still has an open area though this is doing much better there is a lot less drainage than what we previously noted. Fortunately I see no evidence of active infection locally or systemically at this time which is great news. 05-20-2022 upon evaluation today patient appears to be doing excellent in regard to her leg. The left leg is still healed the right leg is much better. Fortunately I see no signs of infection locally or systemically at this time which  is great news. No fevers, chills, nausea, vomiting, or diarrhea. 05-27-2022 upon evaluation today patient appears to be doing excellent in regard to her wound this is actually measuring smaller and looking much better. Fortunately I see no signs of active infection locally or systemically at this time. [name], [name] ([id number]) [id number] Page 11 of 13 06-03-2022 upon evaluation patient's wound actually is measuring smaller and looking much better. She still has a lot of discomfort but fortunately nothing to significant at this point which is great news. No fevers, chills, nausea, vomiting, or diarrhea. I think we are making some pretty good progress here. 06-10-2022 upon evaluation today patient appears to be doing well currently in regard to her wound which is actually measuring somewhat smaller. This is actually 2 separate wounds that are clustered and this is making it appear little bit larger than what it is but nonetheless we are seeing some definite improvement. Fortunately I do not see any signs of active infection at this time. No fevers, chills, nausea, vomiting, or diarrhea. 06-17-2022 upon evaluation today patient appears to be doing well currently in regard to her wound although she was in the hospital since she was last here. That was from 06-12-2022 through 06-16-2022 which was yesterday. This was due to a CHF exacerbation. With that being said the patient has noted in her record review acute on chronic congestive heart failure with hypertensive emergency noted. She also has stage IIIa chronic kidney disease noted. With that being said I do think her wound is much better because I got a lot of fluid off to try to diurese her to a degree but her kidneys would not hold for that they put her on furosemide 40 mg and that is helping to get the fluid off she is much better but still has a lot of fluid buildup. 06-24-2022 upon evaluation today patient appears to be  doing well  currently in regard to her wound is not significantly smaller but it is improved overall. Fortunately I do not see any signs of active infection locally nor systemically at this time. 07-01-2022 upon evaluation today patient appears to be doing okay in regard to her wound I do not believe the silver alginate is helping quite as much as I would like to see. I would actually recommend we switch back over to the [facility] at this point which I think may do better. Fortunately I do not see any signs of infection which is good news were going to switch to Tubigrip on the left leg on the right leg we will get a continue with the compression wrap. 07-08-2022 upon evaluation today patient unfortunately is not doing nearly as well in regard to her left leg. We had to put her back into a 4-layer compression wrap today due to a significant blister that appeared. I do believe that we need to try to get this to reabsorb we will see how things do over the next week. With that being said the right leg is doing well the 4-layer compression wrap and switch to the [facility] seems to be very beneficial I am pleased with what we are seeing in that regard. Objective Constitutional Well-nourished and well-hydrated in no acute distress. Vitals Time Taken: 2:10 PM, Height: 69 in, Weight: 244 lbs, BMI: 36, Temperature: 97.8 F, Pulse: 57 bpm, Respiratory Rate: 18 breaths/min, Blood Pressure: 139/69 mmHg. Respiratory normal breathing without difficulty. Psychiatric this patient is able to make decisions and demonstrates good insight into disease process. Alert and Oriented x 3. pleasant and cooperative. General Notes: Upon inspection patient's wound bed actually showed signs of good granulation and epithelization at this point. Fortunately I do not see any evidence of infection locally or systemically which is great news and overall I am extremely pleased with where things stand today. Integumentary  (Hair, Skin) Wound #4 status is Open. Original cause of wound was Gradually Appeared. The date acquired was: 10/27/2021. The wound has been in treatment 36 weeks. The wound is located on the Right,Lateral Lower Leg. The wound measures 5cm length x 2cm width x 0.1cm depth; 7.854cm^2 area and 0.785cm^3 volume. There is Fat Layer (Subcutaneous Tissue) exposed. There is no tunneling or undermining noted. There is a medium amount of serosanguineous drainage noted. There is medium (34-66%) pink granulation within the wound bed. There is a medium (34-66%) amount of necrotic tissue within the wound bed including Adherent Slough. 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 Nurse Visit as needed Bathing/ Shower/ Hygiene: May shower with wound dressing protected with water repellent cover or cast protector. No tub bath. Anesthetic (Use 'Patient Medications' Section for Anesthetic Order Entry): Lidocaine applied to wound bed Edema Control - Lymphedema / Segmental Compressive Device / Other: Optional: One layer of unna paste to top of compression wrap (to act as an anchor). SHARRELL, KRAWIEC ([id number]) [id number] Page 12 of 13 3 Layer Compression System for Lymphedema. - right left Elevate, Exercise Daily and Avoid Standing for Long Periods of Time. Elevate legs to the level of the heart and pump ankles as often as possible Elevate leg(s) parallel to the floor when sitting. Compression Pump: Use compression pump on left lower extremity for 60 minutes, twice daily. -  You may pump over wraps Compression Pump: Use compression pump on right lower extremity for 60 minutes, twice daily. - You may pump over wraps WOUND  #4: - Lower Leg Wound Laterality: Right, Lateral Cleanser: Soap and Water 1 x Per Week/30 Days Discharge Instructions: Gently cleanse wound with antibacterial soap, rinse and pat dry prior to dressing wounds Cleanser: Wound Cleanser 1 x Per Week/30 Days Discharge Instructions: Wash your hands with soap and water. Remove old dressing, discard into plastic bag and place into trash. Cleanse the wound with Wound Cleanser prior to applying a clean dressing using gauze sponges, not tissues or cotton balls. Do not scrub or use excessive force. Pat dry using gauze sponges, not tissue or cotton balls. Prim Dressing: Silvercel Small 2x2 (in/in) 1 x Per Week/30 Days ary Discharge Instructions: Apply Silvercel Small 2x2 (in/in) as instructed Secondary Dressing: ABD Pad 5x9 (in/in) 1 x Per Week/30 Days Discharge Instructions: Cover with ABD pad Com pression Wrap: 3-LAYER WRAP - Profore Lite LF 3 Multilayer Compression Bandaging System 1 x Per Week/30 Days Discharge Instructions: Apply 3 multi-layer wrap as prescribed. 1. I am going to recommend that we have her continue with the [facility] for the right leg. In regard to the left leg were actually utilizing just the compression ABD pad to catch any drainage the blister is not open I am hoping to get this to reabsorb. 2. Also can recommend that we should continue with 3 layer compression wraps bilaterally. The 4-layer compression wrap is what we were using but it seems to be really cutting it tightly on her legs. Therefore we dropped this back to 3 which is controlling the edema without being too tight I feel like. Overall my biggest issue is not knowing what to do with her long-term as anytime we try to avoid wrapping her even tried Tubigrip for Velcro wraps or otherwise she just breaks right back open with new blisters. We will see patient back for reevaluation in 1 week here in the clinic. If anything worsens or changes patient will contact our office  for additional recommendations. Electronic Signature(s) Signed: 07/08/2022 2:44:25 PM By: Worthy Keeler PA-C Entered By: Worthy Keeler on 07/08/2022 14:44:25 -------------------------------------------------------------------------------- SuperBill Details Patient Name: Date of Service: [name] 07/08/2022 Medical Record Number: [id number] Patient Account Number: [id number] Date of Birth/Sex: Treating RN: Jan 31, 1946 (76 y.o. 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 Modifier: Quantity: 1 Physician Procedures : CPT4 Code Description Modifier [id number] - WC PHYS LEVEL 3 - EST PT [name], [name] ([id number]) [id number] Page ICD-10 Diagnosis Description [phone] Chronic venous hypertension (idiopathic) with ulcer and inflammation  of left lower extremity I89.0 Lymphedema, not elsewhere classified L97.812 Non-pressure chronic ulcer of other part of right lower leg with fat layer exposed Quantity: 1 13 of 13 Electronic Signature(s) Signed: 07/08/2022 3:33:05 PM By: Carlene Coria RN Signed: 07/08/2022 4:04:00 PM By: Worthy Keeler PA-C Previous Signature: 07/08/2022 2:46:00 PM Version By: Worthy Keeler PA-C Entered By: Carlene Coria on 07/08/2022 15:33:05

## 2022-07-15 ENCOUNTER — Encounter: Payer: Medicare Other | Attending: Physician Assistant | Admitting: Internal Medicine

## 2022-07-15 DIAGNOSIS — I509 Heart failure, unspecified: Secondary | ICD-10-CM | POA: Diagnosis not present

## 2022-07-15 DIAGNOSIS — Z853 Personal history of malignant neoplasm of breast: Secondary | ICD-10-CM | POA: Diagnosis not present

## 2022-07-15 DIAGNOSIS — L97812 Non-pressure chronic ulcer of other part of right lower leg with fat layer exposed: Secondary | ICD-10-CM | POA: Diagnosis not present

## 2022-07-15 DIAGNOSIS — I87332 Chronic venous hypertension (idiopathic) with ulcer and inflammation of left lower extremity: Secondary | ICD-10-CM | POA: Diagnosis present

## 2022-07-15 DIAGNOSIS — I890 Lymphedema, not elsewhere classified: Secondary | ICD-10-CM | POA: Insufficient documentation

## 2022-07-15 DIAGNOSIS — I130 Hypertensive heart and chronic kidney disease with heart failure and stage 1 through stage 4 chronic kidney disease, or unspecified chronic kidney disease: Secondary | ICD-10-CM | POA: Diagnosis not present

## 2022-07-15 DIAGNOSIS — N1831 Chronic kidney disease, stage 3a: Secondary | ICD-10-CM | POA: Insufficient documentation

## 2022-07-16 ENCOUNTER — Telehealth: Payer: Self-pay | Admitting: Oncology

## 2022-07-16 ENCOUNTER — Inpatient Hospital Stay: Payer: Medicare Other | Attending: Oncology

## 2022-07-16 ENCOUNTER — Ambulatory Visit
Admission: RE | Admit: 2022-07-16 | Discharge: 2022-07-16 | Disposition: A | Discharge status: Home / Self Care | Payer: Medicare Other | Source: Ambulatory Visit | Attending: Oncology | Admitting: Oncology

## 2022-07-16 DIAGNOSIS — C50411 Malignant neoplasm of upper-outer quadrant of right female breast: Secondary | ICD-10-CM | POA: Insufficient documentation

## 2022-07-16 DIAGNOSIS — Z79811 Long term (current) use of aromatase inhibitors: Secondary | ICD-10-CM | POA: Insufficient documentation

## 2022-07-16 DIAGNOSIS — I2510 Atherosclerotic heart disease of native coronary artery without angina pectoris: Secondary | ICD-10-CM | POA: Insufficient documentation

## 2022-07-16 DIAGNOSIS — C50919 Malignant neoplasm of unspecified site of unspecified female breast: Secondary | ICD-10-CM | POA: Insufficient documentation

## 2022-07-16 DIAGNOSIS — Z803 Family history of malignant neoplasm of breast: Secondary | ICD-10-CM | POA: Insufficient documentation

## 2022-07-16 DIAGNOSIS — Z170 Estrogen receptor positive status [ER+]: Secondary | ICD-10-CM | POA: Insufficient documentation

## 2022-07-16 DIAGNOSIS — E042 Nontoxic multinodular goiter: Secondary | ICD-10-CM | POA: Insufficient documentation

## 2022-07-16 DIAGNOSIS — Z853 Personal history of malignant neoplasm of breast: Secondary | ICD-10-CM | POA: Diagnosis not present

## 2022-07-16 DIAGNOSIS — Z01818 Encounter for other preprocedural examination: Secondary | ICD-10-CM | POA: Diagnosis not present

## 2022-07-16 DIAGNOSIS — N289 Disorder of kidney and ureter, unspecified: Secondary | ICD-10-CM | POA: Insufficient documentation

## 2022-07-16 DIAGNOSIS — E079 Disorder of thyroid, unspecified: Secondary | ICD-10-CM | POA: Insufficient documentation

## 2022-07-16 DIAGNOSIS — Z08 Encounter for follow-up examination after completed treatment for malignant neoplasm: Secondary | ICD-10-CM | POA: Diagnosis present

## 2022-07-16 DIAGNOSIS — I700 Atherosclerosis of aorta: Secondary | ICD-10-CM | POA: Diagnosis not present

## 2022-07-16 DIAGNOSIS — Z87891 Personal history of nicotine dependence: Secondary | ICD-10-CM | POA: Insufficient documentation

## 2022-07-16 LAB — GLUCOSE, CAPILLARY: Glucose-Capillary: 110 mg/dL — ABNORMAL HIGH (ref 70–99)

## 2022-07-16 MED ORDER — FLUDEOXYGLUCOSE F - 18 (FDG) INJECTION
12.8000 | Freq: Once | INTRAVENOUS | Status: AC | PRN
  Administered 2022-07-16: 13.95 via INTRAVENOUS

## 2022-07-16 NOTE — Telephone Encounter (Signed)
Spoke with patient's daughter and confirmed her appointments with Dr. Janese Banks on 11/13. Patient was with her and they do not have any questions about her appointments.

## 2022-07-16 NOTE — Progress Notes (Signed)
[name], [name] ([id number]) [id number] Page 1 of 9 Visit Report for 07/15/2022 Arrival Information Details Patient Name: Date of Service: [name] [name] 07/15/2022 2:00 PM Medical Record Number: [id number] Patient Account Number: [id number] Date of Birth/Sex: Treating RN: [birth year] (76 y.o. [name] Primary Care Berdena Cisek: [name] Other Clinician: Referring Abas Leicht: Treating Mystie Ormand/Extender: RO BSO N, MICHA EL G Aycock, Ngwe Weeks in Treatment: 4 Visit Information History Since Last Visit All ordered tests and consults were completed: No Patient Arrived: Wheel Chair Added or deleted any medications: No Arrival Time: 14:02 Any new allergies or adverse reactions: No Accompanied By: self Had a fall or experienced change in No Transfer Assistance: None activities of daily living that may affect Patient Identification Verified: Yes risk of falls: Secondary Verification Process Completed: Yes Signs or symptoms of abuse/neglect since last visito No Patient Requires Transmission-Based Precautions: No Hospitalized since last visit: No Patient Has Alerts: Yes Implantable device outside of the clinic excluding No Patient Alerts: NOT DIABETIC cellular tissue based products placed in the center since last visit: Has Dressing in Place as Prescribed: Yes Pain Present Now: No Electronic Signature(s) Signed: 07/15/2022 4:53:18 PM By: Carlene Coria RN Entered By: Carlene Coria on 07/15/2022 14:12:58 -------------------------------------------------------------------------------- Clinic Level of Care Assessment Details Patient Name: Date of Service: [name] [name] 07/15/2022 2:00 PM Medical Record Number: [id number] Patient Account Number: [id number] Date of Birth/Sex: Treating RN: Jan 05, 1946 (76 y.o. [name] Primary Care [name]: [name] Other Clinician: Referring [name]: Treating [name]/Extender: RO BSO N, MICHA EL  G Aycock, Ngwe Weeks in Treatment: 46 Clinic Level of Care Assessment Items TOOL 1 Quantity Score '[]'$  - 0 Use when EandM and Procedure is performed on INITIAL visit ASSESSMENTS - Nursing Assessment / Reassessment '[]'$  - 0 General Physical Exam (combine w/ comprehensive assessment (listed just below) when performed on new pt. evals) '[]'$  - 0 Comprehensive Assessment (HX, ROS, Risk Assessments, Wounds Hx, etc.) [name], [name] ([id number]) [id number] Page 2 of 9 ASSESSMENTS - Wound and Skin Assessment / Reassessment '[]'$  - 0 Dermatologic / Skin Assessment (not related to wound area) ASSESSMENTS - Ostomy and/or Continence Assessment and Care '[]'$  - 0 Incontinence Assessment and Management '[]'$  - 0 Ostomy Care Assessment and Management (repouching, etc.) PROCESS - Coordination of Care '[]'$  - 0 Simple Patient / Family Education for ongoing care '[]'$  - 0 Complex (extensive) Patient / Family Education for ongoing care '[]'$  - 0 Staff obtains Programmer, systems, Records, T Results / Process Orders est '[]'$  - 0 Staff telephones HHA, Nursing Homes / Clarify orders / etc '[]'$  - 0 Routine Transfer to another Facility (non-emergent condition) '[]'$  - 0 Routine Hospital Admission (non-emergent condition) '[]'$  - 0 New Admissions / Biomedical engineer / Ordering NPWT Apligraf, etc. , '[]'$  - 0 Emergency Hospital Admission (emergent condition) PROCESS - Special Needs '[]'$  - 0 Pediatric / Minor Patient Management '[]'$  - 0 Isolation Patient Management '[]'$  - 0 Hearing / Language / Visual special needs '[]'$  - 0 Assessment of Community assistance (transportation, D/C planning, etc.) '[]'$  - 0 Additional assistance / Altered mentation '[]'$  - 0 Support Surface(s) Assessment (bed, cushion, seat, etc.) INTERVENTIONS - Miscellaneous '[]'$  - 0 External ear exam '[]'$  - 0 Patient Transfer (multiple staff / Civil Service fast streamer / Similar devices) '[]'$  - 0 Simple Staple / Suture removal (25 or less) '[]'$  - 0 Complex Staple /  Suture removal (26 or more) '[]'$  - 0 Hypo/Hyperglycemic Management (do not check if billed separately) '[]'$  - 0 Ankle / Brachial  Index (ABI) - do not check if billed separately Has the patient been seen at the hospital within the last three years: Yes Total Score: 0 Level Of Care: ____ Electronic Signature(s) Signed: 07/15/2022 4:53:18 PM By: Carlene Coria RN Entered By: Carlene Coria on 07/15/2022 14:51:28 -------------------------------------------------------------------------------- Compression Therapy Details Patient Name: Date of Service: [name] [name] 07/15/2022 2:00 PM Medical Record Number: [id number] Patient Account Number: [id number] Date of Birth/Sex: Treating RN: September 26, 1945 (76 y.o. [name] Primary Care Terrie Grajales: [name] Other Clinician: Referring Charlotte Fidalgo: Treating [name]/Extender: [address], [name], Ngwe [name], [place] ([id number]) [id number] Page 3 of 9 Weeks in Treatment: 77 Compression Therapy Performed for Wound Assessment: Wound #4 Right,Lateral Lower Leg Performed By: Clinician Carlene Coria, RN Compression Type: Three Layer Post Procedure Diagnosis Same as Pre-procedure Electronic Signature(s) Signed: 07/15/2022 4:53:18 PM By: Carlene Coria RN Entered By: Carlene Coria on 07/15/2022 15:01:06 -------------------------------------------------------------------------------- Compression Therapy Details Patient Name: Date of Service: [name] [name] 07/15/2022 2:00 PM Medical Record Number: [id number] Patient Account Number: [id number] Date of Birth/Sex: Treating RN: February 13, 1946 (76 y.o. [name] Primary Care [name]: [name] Other Clinician: Referring Curry Dulski: Treating Shali Vesey/Extender: RO BSO N, MICHA EL G Aycock, Ngwe Weeks in Treatment: 42 Compression Therapy Performed for Wound Assessment: NonWound Condition Lymphedema - Left Leg Performed By: Clinician Carlene Coria, RN Compression Type:  Three Layer Post Procedure Diagnosis Same as Pre-procedure Electronic Signature(s) Signed: 07/15/2022 4:53:18 PM By: Carlene Coria RN Entered By: Carlene Coria on 07/15/2022 15:01:26 -------------------------------------------------------------------------------- Encounter Discharge Information Details Patient Name: Date of Service: [name] [name] 07/15/2022 2:00 PM Medical Record Number: [id number] Patient Account Number: [id number] Date of Birth/Sex: Treating RN: 1946/04/02 (76 y.o. [name] Primary Care Boyce Keltner: [name] Other Clinician: Referring Davida Falconi: Treating Sharunda Salmon/Extender: RO BSO N, MICHA EL Havery Moros, Ngwe Weeks in Treatment: 84 Encounter Discharge Information Items Discharge Condition: Stable Ambulatory Status: Wheelchair Discharge Destination: Home Transportation: [address] [place], [place] ([id number]) [id number] Page 4 of 9 Accompanied By: self Schedule Follow-up Appointment: Yes Clinical Summary of Care: Electronic Signature(s) Signed: 07/15/2022 4:53:18 PM By: Carlene Coria RN Entered By: Carlene Coria on 07/15/2022 15:02:53 -------------------------------------------------------------------------------- Lower Extremity Assessment Details Patient Name: Date of Service: [name] [name] 07/15/2022 2:00 PM Medical Record Number: [id number] Patient Account Number: [id number] Date of Birth/Sex: Treating RN: 05-Feb-1946 (76 y.o. [name] Primary Care Kenedi Cilia: [name] Other Clinician: Referring Latasha Puskas: Treating Sherill Mangen/Extender: RO BSO N, MICHA EL G Aycock, Ngwe Weeks in Treatment: 77 Edema Assessment Assessed: [Left: No] [Right: No] Edema: [Left: Yes] [Right: Yes] Calf Left: Right: Point of Measurement: From Medial Instep 37 cm 36 cm Ankle Left: Right: Point of Measurement: From Medial Instep 25 cm 24 cm Vascular Assessment Pulses: Dorsalis Pedis Palpable: [Left:Yes] [Right:Yes] Electronic  Signature(s) Signed: 07/15/2022 4:53:18 PM By: Carlene Coria RN Entered By: Carlene Coria on 07/15/2022 14:32:47 -------------------------------------------------------------------------------- Multi Wound Chart Details Patient Name: Date of Service: [name] [name] 07/15/2022 2:00 PM Medical Record Number: [id number] Patient Account Number: [id number] Date of Birth/Sex: Treating RN: 1946/04/15 (76 y.o. [name] [place], [place] ([id number]) [id number] Page 5 of 9 Primary Care Elmira Olkowski: [name] Other Clinician: Referring Alfonso Shackett: Treating Sheresa Cullop/Extender: RO BSO N, MICHA EL G Aycock, Ngwe Weeks in Treatment: 66 Vital Signs Height(in): 69 Pulse(bpm): 72 Weight(lbs): 244 Blood Pressure(mmHg): 168/78 Body Mass Index(BMI): 36 Temperature(F): 98.2 Respiratory Rate(breaths/min): 18 [4:Photos:] [N/A:N/A] Right, Lateral Lower Leg N/A N/A Wound Location: Gradually Appeared N/A  N/A Wounding Event: Venous Leg Ulcer N/A N/A Primary Etiology: Cataracts, Lymphedema, Sleep N/A N/A Comorbid History: Apnea, Congestive Heart Failure, Hypertension, Osteoarthritis, Neuropathy, Received Chemotherapy, Received Radiation 10/27/2021 N/A N/A Date Acquired: 59 N/A N/A Weeks of Treatment: Open N/A N/A Wound Status: No N/A N/A Wound Recurrence: 5x1.5x0.1 N/A N/A Measurements L x W x D (cm) 5.89 N/A N/A A (cm) : rea 0.589 N/A N/A Volume (cm) : -400.00% N/A N/A % Reduction in Area: -399.20% N/A N/A % Reduction in Volume: Full Thickness Without Exposed N/A N/A Classification: Support Structures Medium N/A N/A Exudate Amount: Serosanguineous N/A N/A Exudate Type: red, brown N/A N/A Exudate Color: Medium (34-66%) N/A N/A Granulation Amount: Pink N/A N/A Granulation Quality: Medium (34-66%) N/A N/A Necrotic Amount: Fat Layer (Subcutaneous Tissue): Yes N/A N/A Exposed Structures: Fascia: No Tendon: No Muscle: No Joint: No Bone:  No None N/A N/A Epithelialization: Treatment Notes Electronic Signature(s) Signed: 07/15/2022 4:53:18 PM By: Carlene Coria RN Entered By: Carlene Coria on 07/15/2022 [place] Details -------------------------------------------------------------------------------- [name] ([id number]) [id number] Page 6 of 9 Patient Name: Date of Service: [name] [name] 07/15/2022 2:00 PM Medical Record Number: [id number] Patient Account Number: [id number] Date of Birth/Sex: Treating RN: Apr 28, 1946 (76 y.o. [name] Primary Care Dashley Monts: [name] Other Clinician: Referring Atilano Covelli: Treating Leyana Whidden/Extender: RO BSO N, MICHA EL Havery Moros, Ngwe Weeks in Treatment: 33 Active Inactive Electronic Signature(s) Signed: 07/15/2022 4:53:18 PM By: Carlene Coria RN Entered By: Carlene Coria on 07/15/2022 14:32:57 -------------------------------------------------------------------------------- Pain Assessment Details Patient Name: Date of Service: [name] [name] 07/15/2022 2:00 PM Medical Record Number: [id number] Patient Account Number: [id number] Date of Birth/Sex: Treating RN: December 24, 1945 (76 y.o. [name] Primary Care Minna Dumire: [name] Other Clinician: Referring Nelissa Bolduc: Treating Avea Mcgowen/Extender: RO BSO N, MICHA EL G Aycock, Ngwe Weeks in Treatment: 67 Active Problems Location of Pain Severity and Description of Pain Patient Has Paino No Site Locations Pain Management and Medication Current Pain Management: Electronic Signature(s) Signed: 07/15/2022 4:53:18 PM By: Carlene Coria RN Entered By: Carlene Coria on 07/15/2022 14:13:59 [name] ([id number]) [id number] Page 7 of 9 -------------------------------------------------------------------------------- Patient/Caregiver Education Details Patient Name: Date of Service: [name] [name] 11/7/2023andnbsp2:00 PM Medical  Record Number: [id number] Patient Account Number: [id number] Date of Birth/Gender: Treating RN: 1945-12-25 (76 y.o. [name] Primary Care Physician: [name] Other Clinician: Referring Physician: Treating Physician/Extender: RO BSO N, MICHA EL Havery Moros, Ngwe Weeks in Treatment: 60 Education Assessment Education Provided To: Patient Education Topics Provided Wound/Skin Impairment: Methods: Explain/Verbal Responses: State content correctly Electronic Signature(s) Signed: 07/15/2022 4:53:18 PM By: Carlene Coria RN Entered By: Carlene Coria on 07/15/2022 14:51:44 -------------------------------------------------------------------------------- Wound Assessment Details Patient Name: Date of Service: [name] [name] 07/15/2022 2:00 PM Medical Record Number: [id number] Patient Account Number: [id number] Date of Birth/Sex: Treating RN: 04/05/1946 (76 y.o. [name] Primary Care Jacqualin Shirkey: [name] Other Clinician: Referring Fumie Fiallo: Treating Corrie Reder/Extender: RO BSO N, MICHA EL G Aycock, Ngwe Weeks in Treatment: 77 Wound Status Wound Number: 4 Primary Venous Leg Ulcer Etiology: Wound Location: Right, Lateral Lower Leg Wound Open Wounding Event: Gradually Appeared Status: Date Acquired: 10/27/2021 Comorbid Cataracts, Lymphedema, Sleep Apnea, Congestive Heart Failure, Weeks Of Treatment: 37 History: Hypertension, Osteoarthritis, Neuropathy, Received Chemotherapy, Clustered Wound: No Received Radiation Photos [employer], [name] ([id number]) [id number] Page 8 of 9 Wound Measurements Length: (cm) 5 Width: (cm) 1.5 Depth: (cm) 0.1 Area: (cm) 5.89 Volume: (cm) 0.589 % Reduction in Area: -400% % Reduction in  Volume: -399.2% Epithelialization: None Tunneling: No Undermining: No Wound Description Classification: Full Thickness Without Exposed Support Structures Exudate Amount: Medium Exudate Type: Serosanguineous Exudate Color: red,  brown Foul Odor After Cleansing: No Slough/Fibrino Yes Wound Bed Granulation Amount: Medium (34-66%) Exposed Structure Granulation Quality: Pink Fascia Exposed: No Necrotic Amount: Medium (34-66%) Fat Layer (Subcutaneous Tissue) Exposed: Yes Necrotic Quality: Adherent Slough Tendon Exposed: No Muscle Exposed: No Joint Exposed: No Bone Exposed: No Treatment Notes Wound #4 (Lower Leg) Wound Laterality: Right, Lateral Cleanser Soap and Water Discharge Instruction: Gently cleanse wound with antibacterial soap, rinse and pat dry prior to dressing wounds Wound Cleanser Discharge Instruction: Wash your hands with soap and water. Remove old dressing, discard into plastic bag and place into trash. Cleanse the wound with Wound Cleanser prior to applying a clean dressing using gauze sponges, not tissues or cotton balls. Do not scrub or use excessive force. Pat dry using gauze sponges, not tissue or cotton balls. Peri-Wound Care Topical Primary Dressing Silvercel Small 2x2 (in/in) Discharge Instruction: Apply Silvercel Small 2x2 (in/in) as instructed Secondary Dressing ABD Pad 5x9 (in/in) Discharge Instruction: Cover with ABD pad Secured With Compression Wrap 3-LAYER WRAP - Profore Lite LF 3 Multilayer Compression Bandaging System Discharge Instruction: Apply 3 multi-layer wrap as prescribed. Compression Stockings Add-Ons Electronic Signature(s) Signed: 07/15/2022 4:53:18 PM By: Carlene Coria RN Entered By: Carlene Coria on 07/15/2022 14:30:49 [name] ([id number]) [id number] Page 9 of 9 -------------------------------------------------------------------------------- Vitals Details Patient Name: Date of Service: [name] [name] 07/15/2022 2:00 PM Medical Record Number: [id number] Patient Account Number: [id number] Date of Birth/Sex: Treating RN: [birth year] (76 y.o. [name] Primary Care Lankford Gutzmer: [name] Other Clinician: Referring  Nehal Shives: Treating Christoffer Currier/Extender: RO BSO N, MICHA EL G Aycock, Ngwe Weeks in Treatment: 6 Vital Signs Time Taken: 14:13 Temperature (F): 98.2 Height (in): 69 Pulse (bpm): 72 Weight (lbs): 244 Respiratory Rate (breaths/min): 18 Body Mass Index (BMI): 36 Blood Pressure (mmHg): 168/78 Reference Range: 80 - 120 mg / dl Electronic Signature(s) Signed: 07/15/2022 4:53:18 PM By: Carlene Coria RN Entered By: Carlene Coria on 07/15/2022 14:13:33

## 2022-07-16 NOTE — Progress Notes (Signed)
INGA, NOLLER ([id number]) [id number] Page 1 of 12 Visit Report for 07/15/2022 HPI Details Patient Name: Date of Service: [name] 07/15/2022 2:00 PM Medical Record Number: [id number] Patient Account Number: [id number] Date of Birth/Sex: Treating RN: 1946/07/30 (76 y.o. [name] Primary Care Provider: [name] Other Clinician: Referring Provider: Treating Provider/Extender: RO BSO N, MICHA EL G Aycock, Ngwe Weeks in Treatment: 64 History of Present Illness HPI Description: 03/15/2020 upon evaluation today patient presents today for initial evaluation here in our clinic concerning a wound that is on the left posterior lower extremity. Unfortunately this has been giving the patient some discomfort at this point which she notes has been affected her pretty much daily. The patient does have a history of venous insufficiency, hypertension, congestive heart failure, and a history of breast cancer. Fortunately there does not appear to be evidence of active infection at this time which is great news. No fevers, chills, nausea, vomiting, or diarrhea. Wound is not extremely large but does have some slough covering the surface of the wound. This is going require sharp debridement today. 03/15/2020 upon evaluation today patient appears to be doing fairly well in regard to her wound. She does have some slough noted on the surface of the wound currently but I do believe the Iodoflex has been beneficial over the past week. There is no signs of active infection at this time which is great news and overall very pleased with the progress. No fevers, chills, nausea, vomiting, or diarrhea. 04/02/2020 upon evaluation today patient appears to be doing a little better in regard to her wound size wise this is not tremendously smaller she does have some slough buildup on the surface of the wound. With that being said this is going require some sharp debridement today to  clear away some of the necrotic debris. Fortunately there is no evidence of active infection at this time. No fevers, chills, nausea, vomiting, or diarrhea. 04/10/2020 on evaluation today patient appears to be doing well with regard to her ulcer which is measuring somewhat smaller today. She actually has more granulation tissue noted which is also good news is no need for sharp debridement today. Fortunately there is no evidence of infection either which is also excellent. No fevers, chills, nausea, vomiting, or diarrhea. 04/26/2020 on evaluation today patient's wound actually is showing signs of good improvement which is great news. There does not appear to be any evidence of active infection. I do believe she is tolerating the collagen at this point. 05/03/2020 upon evaluation today patient's wound actually showed signs of good granulation at this time there does not appear to be any evidence of active infection which is great news and overall very pleased with where things stand. With that being said I do believe that the patient is can require some sharp debridement today but fortunately nothing too significant. 05/11/20 on evaluation today patient appears to be doing well in regard to her leg ulcer. She's been tolerating the dressing changes without complication. Fortunately there is no signs of active infection at this time. Overall I'm very pleased with where things stand. 05/18/2020 upon evaluation today patient's wound is showing signs of improvement is very dry and I think the collagen for the most part just dried to the wound bed. I am going to clean this away with sharp debridement in order to get this under control in my opinion. 05/25/2020 upon evaluation today patient actually appears to be doing well in regard to her leg  ulcer. In fact upon inspection it appears she could potentially be completely healed but that is not guaranteed based on what we are seeing. There does not appear to be any  signs of active infection which is great news. 05/31/2020 on evaluation today patient appears to be doing well with regard to her wounds. She in fact appears to be almost completely healed on the left leg there is just a very small opening remaining point 1 06/08/2020 upon evaluation today patient appears to be doing excellent in regard to her leg ulcer on the left in fact it appears to be that she is completely healed as of today. I see no signs of active infection at this time which is great news. No fevers, chills, nausea, vomiting, or diarrhea. READMISSION 01/16/2021 Patient that we have had in this clinic with a wound on the left posterior calf in the summer 2021 extending into October. She was felt to have chronic venous insufficiency. Per the patient's description she was discharged with what sounds like external compression stockings although she could not afford the "$75 per leg". She therefore did not get anything. Apparently her wound that she has currently is been in existence since January. She has been followed at vein and vascular with Unna boots and I think calcium alginate. She had ABIs done on 06/25/2020 that showed an noncompressible ABI on the right leg and the left leg but triphasic waveforms with good great toe pressures and a TBI of 0.90 on the right and 0.91 on the left Surprisingly the patient also had venous reflux studies that were really unremarkable. This included no evidence of DVT or SVT but there was no evidence of s s deep venous insufficiency or superficial venous insufficiency in the greater saphenous or short saphenous veins bilaterally. I would wonder about more central venous issues or perhaps this is all lymphedema 01/25/2021 upon evaluation today patient appears to be doing decently well and guard to her wound. The good news is the Iodoflex that is job she saw Dr. Dellia Nims last week for readmission and to be honest I think that this has done extremely well over that  week. I do believe that she can tolerate the sharp debridement today which will be good. I think that cleaning off the wound will likely allow [place] to be able to use a different type of dressing I do not think the Iodoflex will even be necessary based on how clean the wound looks. Fortunately there is no sign of active infection at this time which is great news. No fevers, chills, nausea, vomiting, or diarrhea. [name], [name] ([id number]) [id number] Page 2 of 12 02/12/2021 upon evaluation today patient appears to be doing well with regard to her leg ulcer. We did switch to alginate when she came for nurse visit I think is doing much better for her. Fortunately there does not appear to be any signs of active infection at this time which is great news. No fevers, chills, nausea, vomiting, or diarrhea. 02/18/2021 upon evaluation today patient's wound is actually showing signs of good improvement. I am very pleased with where things stand currently. I do not see any signs of active infection which is great news and overall I feel like she is making good progress. The patient likewise is happy that things are doing so well and that this is measuring somewhat smaller. 02/25/2021 upon evaluation today patient actually appears to be doing quite well in regard to her wounds. She has been tolerating the dressing  changes without complication. Fortunately there does not appear to be any signs of active infection which is great news and overall I am extremely pleased with where things stand. No fevers, chills, nausea, vomiting, or diarrhea. She does have a lot of edema I really do think she needs compression socks to be worn in order to prevent things from causing additional issues for her currently. Especially on the right leg she should be wearing compression socks all the time to be honest. 03/04/2021 upon evaluation today patient's leg ulcer actually appears to be doing pretty well  which is great news. There does not appear to be any significant change but overall the appearance is better even though the size is not necessarily reflecting a great improvement. Fortunately there does not appear to be any signs of active infection. No fevers, chills, nausea, vomiting, or diarrhea. 03/12/2021 upon evaluation today patient appears to be doing well with regard to her wounds. She has been tolerating the dressing changes without complication. The good news is that the wound on her left lateral leg is doing much better and is showing signs of good improvement. Overall her swelling is controlled well as well. She does need some compression socks she plans to go Jefferey socks next week. 03/18/2021 on evaluation today patient's wound actually is showing signs of good improvement. She does have a little bit of slough this can require some sharp debridement today. 03/25/2021 upon evaluation today patient appears to be doing well with regard to her wound on the left lateral leg. She has been tolerating the dressing changes without complication. Fortunately there does not appear to be any signs of active infection at this time. No fever chills no 04/01/2021 upon evaluation today patient's wound is showing signs of improvement and overall very pleased with where things stand at this point. There is no evidence of active infection which is great news as well and in general I think that she is making great progress. 04/09/2021 upon evaluation today patient appears to be doing well with regard to her ankle ulcer. She is making good progress currently which is great news. There does not appear to be any signs of infection also excellent news. In general I am extremely pleased with where things stand today. No fevers, chills, nausea, vomiting, or diarrhea. 04/23/2021 upon inspection today patient appears to be doing quite well with regard to her leg ulcer. She has been tolerating the dressing changes  without complication. She is can require some sharp debridement today but overall seems to be doing excellent. 05/06/2021 upon evaluation today patient unfortunately appears to be doing poorly overall in regard to her swelling. She is actually significantly more swollen than last week despite the fact that her wound is doing better this is not a good trend. I think that she probably needs to see her cardiologist ASAP and I discussed that with her today. This includes the fact that honestly I think she probably is getting need an increase in her diuretic or something to try to clear away some of the excess fluid that she is experiencing at the moment. She is not been doing her pumps even twice a day but again right now more concerned that if she were to do the pump she would fluid overload her lungs causing other issues as far as her breathing is concerned. Obviously I think she needs to contact them and move up the appointment for the 12th of something sooner 2 weeks is a bit too long  to wait with how swollen she has. 05/14/2021 upon evaluation today patient's wound actually showing signs of being a little bit smaller compared to previous. She has been making good progress however. Fortunately there does not appear to be any evidence of active infection which is great. Overall I am extremely pleased in that regard. Nonetheless I am still concerned that she is having quite a bit of issue as far as the swelling is concerned she has been placed on fluid restriction as well as an increase in the furosemide by her doctor. We will see how things progress. 05/21/2021 upon evaluation today patient appears to be doing well with regard to her wound in general. She has been tolerating the dressing changes without complication. With that being said she has been using silver cell for a while and while this was showing signs of improvement it is now gotten to the point where this has slowed down quite significantly. I  do believe that switching to a different dressing may be beneficial for her today. 05/28/2021 upon evaluation today patient actually appears to be doing quite well in regard to her wound. In fact there is really not any need for sharp debridement today based on what I see. The Hydrofera Blue is doing great and overall I think that she is managing quite nicely with the compression wrapping. Her edema is still down quite a bit with the wrapping in place. 06/11/2021 upon evaluation today patient appears to be doing well with regard to her leg ulcer. She has been tolerating the dressing changes without complication. Fortunately there does not appear to be any signs of active infection at this time which is great news. No fevers, chills, nausea, vomiting, or diarrhea. She is going require some sharp debridement today. 10/11; left leg ulcer much better looking and with improved surface area. She is using Hydrofera Blue under 3 layer compression. She does not have any stocking on the right leg which in itself has very significant nonpitting edema 06/25/2021 upon evaluation today patient appears to be doing well with regard to the wound on her left leg. She has been tolerating the dressing changes without complication and overall I am extremely pleased with where things stand today. I do not see any evidence of infection which is great news. 07/02/2021 upon evaluation today patient's wound actually showing signs of excellent improvement and actually very pleased with where we stand today and the overall appearance. Fortunately there does not appear to be any signs of active infection. No fevers, chills, nausea, vomiting, or diarrhea. 07/09/2021 upon evaluation today patient appears to be doing better in regard to her wound this is measuring smaller and she is headed in the appropriate direction based on what I am seeing currently. I do not see any evidence of active infection systemically which is great  news. 07/16/2021 upon evaluation today patient appears to be doing pretty well currently in regard to her leg ulcer. This is measuring smaller and looking much better this is great news. Fortunately there does not appear to be any evidence of active infection systemically which is great news as well. 07/23/2021 upon evaluation today patient's wound is actually showing signs of good improvement this is definitely measuring smaller. I do not see any signs of infection which is great news and overall very pleased in that regard. Overall I think that she is doing well with the [facility]. 07/30/2020 upon evaluation today patient appears to be doing well in regard to her wound. She  has been tolerating the dressing changes without complication think the Hydrofera Blue at this point is actually causing her to dry out a lot as far as the wound bed is concerned. Subsequently I think that we need to try to see what we can do to improve this. Fortunately there does not appear to be any evidence of active infection at this time which is great news. 08/06/2021 upon evaluation today patient appears to be doing well with regards to her wound. Is not measuring significantly smaller initial inspection but upon closer inspection she has a lot of new skin growth across the central portion of the wound. I think will get very close to complete resolution. 08/13/2021 upon evaluation today patient appears to be doing well with regard to her wound. In fact this appears to be I believe healed although there is still some question as to whether it is completely so. I am concerned about the fact that to be honest she still has some evidence of dry skin that could be just trapping something underneath I still want to debride anything as I am afraid of causing some damage to the good skin for that reason I Georgina Peer probably monitor for 1 more week before completely closing everything out. 08/20/2021 upon evaluation today patient  actually appears to be doing excellent. In fact she appears to be completely healed. This was the case last week as [name], [name] ([id number]) [id number] Page 3 of 12 well we just wanted to make sure nothing reopened since she does not really have an ability to put on compression socks this is good to be something we need to make sure was well-healed. Nonetheless I think we have achieved that goal as of today. 12/27; this is a patient we discharged 2 weeks ago with wounds on her left lower leg laterally. She was supposed to get stockings and really did not get them. She developed blistering and reopening probably sometime late last week. She has 3 areas in the same area as previously. The mid calf dimensions on the left went from 39 to 51 cm today. She also has very significant edema on the right leg but as far as I am aware has not had wounds in this area 1/4; patient presents for follow-up. She has tolerated the compression wrap well. She has no issues or complaints today. 09/17/2021 upon evaluation today patient appears to be doing well with regard to her wounds. She has been tolerating the dressing changes without complication. Fortunately there does not appear to be any signs of active infection at this time. No fevers, chills, nausea, vomiting, or diarrhea. 10/01/2021 upon evaluation today patient appears to be doing well with regard to her wound this is actually measuring better and looking smaller and I am very pleased in that regard. Fortunately I do not see any evidence of active infection locally nor systemically at this point. No fevers, chills, nausea, vomiting, or diarrhea. 10/08/2021 upon evaluation patient's wounds are actually showing some signs of improvement measuring a little bit smaller and looking a little bit better. Overall I do not think there is any need for sharp debridement today which is good news. No fevers, chills, nausea, vomiting, or  diarrhea. 10/14/2021 upon evaluation today patient appears to be doing well with regard to her wound. She has been tolerating the dressing changes without complication and overall I am extremely pleased with where things stand today. There is a little bit of film on the surface of the wounds but this  was carefully cleaned away with just saline and gauze she really did not want me to do any sharp debridement today. 10/29/2021 upon evaluation today patient appears to be doing quite well in regard to her wounds. She is actually showing signs of excellent improvement which is great news and overall I feel like we are on the right track at this time. She does have a wound on both legs and she does need something compression wise when she heals for that reason we will get a go and see about ordering her bilateral juxta lite compression wraps. 11/05/2021 upon evaluation today patient appears to be doing well with regard to her wound. She has been tolerating the dressing changes without complication. Fortunately I do not see any evidence of active infection locally nor systemically at this time which is great news. No fevers, chills, nausea, vomiting, or diarrhea. 11/12/2021 upon evaluation today patient appears to be doing well with regard to her wounds. She is doing excellent on the left on the right this is not doing quite as well there is some slough and biofilm buildup. 3/14; patient presents for follow-up. She has no issues or complaints today. She has tolerated the compression wraps well. 11/26/2021 upon evaluation today patient appears to be doing well with regard to her wounds. Both are showing signs of good improvement which is great news. I do not see any evidence of active infection and overall think she is healing quite nicely. 12/03/2021 upon evaluation today patient appears to be doing well with regard to her wounds. Both are showing signs of excellent improvement I am very pleased with where things  stand and I think that she is making good progress here. Fortunately I do not see any signs of active infection locally or systemically which is great news. No fevers, chills, nausea, vomiting, or diarrhea. 12-10-2021 upon evaluation today patient's wounds actually appear to be doing awesome. I am extremely pleased with where we stand I think she is making excellent progress. I do not see any signs of active infection locally or systemically at this time. 12-17-2021 upon evaluation today patient's wounds are actually showing signs of good improvement. Fortunately I do not see any evidence of active infection locally nor systemically which is great news. Overall I do not believe that she is showing any signs of significant worsening which is good news. With that being said I do believe that she has a little bit of film on the surface of the wound I was actually able to clear this away with saline and gauze no sharp debridement was necessary. 12-24-2021 upon evaluation today patient appears to be doing well with regard to her wounds. I do not see any signs of active infection currently which is great news and overall I think that we are on the right track here. No fevers, chills, nausea, vomiting, or diarrhea. 12-31-2021 upon evaluation today patient appears to be doing well with regard to her wounds. Both are showing signs of improvement the wound on the right leg is going require some sharp debridement on the left seems to be doing quite well. 01-07-2022 upon evaluation today patient appears to be doing well currently in regard to her wounds. She is actually making good progress bilaterally. Both of them require little bit of sharp debridement but not too much which is great news. Upon inspection patient's wound bed actually showed signs of good granulation and epithelization on both legs. She still is having significant issues with the right leg  compared to the left although I do feel like both are actually  showing signs of pretty good improvement. 01-21-2022 upon evaluation today patient's wound is actually showing signs of excellent improvement I am very pleased with where we stand currently. I do not see any signs of active infection locally or systemically which is great news. No fevers, chills, nausea, vomiting, or diarrhea. 01-28-2022 upon evaluation today patient appears to be doing well with regard to her wound on the left leg this is pretty much just about healed which is great news. Unfortunately in regard to the wound on her right leg this is not doing nearly as well in fact I think we probably need to see about a culture today that was discussed with her. 02-04-2022 upon evaluation today patient appears to be doing well with regard to her left leg which in fact might actually be completely healed I am not 100% sure. Nonetheless this is shown signs of excellent improvement which is great news. With that being said in regards to the right leg there is some slough and film buildup on the surface of the wound although she is feeling much better than last week this does appear to be doing much better than it was last week. Fortunately there does not appear to be any signs of active infection locally or systemically at this time. 02-11-2022 upon evaluation today patient's wounds appear to be doing decently well. In fact the left leg is healed although there is brand-new skin were definitely given still need to wrap her for the time being. On the right leg this is showing signs of some epithelial growth in the middle of the wound it still measures the same because there is still a larger area with speckled openings but again as far as the entire area being open this is significantly improved which is great news. I am very pleased. 6/13; left leg remains healed and she has a juxta light to apply to it today. There is some concern about whether she is going to be able to do this at home. She lives with a  daughter although she has her own disability. On the right the wound looks smaller to me nice epithelialization good edema control. 02-25-2022 upon evaluation today patient appears to be doing well with regard to her legs the left leg is still healed the right leg is doing much better. I think we are on the right track here. With that being said unfortunately she continues to have significant issues with some other problems and she actually tells me on Saturday she was having lunch and went inside to sit down when she tells me she had all of a sudden a sharp sensation that went from her hand through her arm into her leg and her neck and she tells me that she could not see anything for the next hour it was completely black. Slowly after this her vision started to return and has been normal since. It sounds to me as if she may have had a TIA or mini stroke at this point. Nonetheless I think that she needs to have this checked out ASAP. Her primary care provider is Dr. Clide Deutscher at [facility] We will get a try to get in touch with him. 03-10-2022 upon evaluation today patient's wound actually showed signs of good improvement. She seems to be making excellent progress here. I do not see [name], [name] ([id number]) [id number] Page 4 of 12 any evidence of active infection locally or  systemically which is great news. No fevers, chills, nausea, vomiting, or diarrhea. She was in the hospital since have seen her last she ended up finding out that she was having syncopal episodes. That is what was going on with the blacking out and losing vision. Subsequently they adjusted some of her medication she seems to be doing better. 03-18-2022 upon evaluation today patient's wound is actually showing signs of good granulation and epithelization at this point. Fortunately I do not see any signs of active infection locally or systemically which is great news. 03-25-2022 any evidence of active  infection locally or systemically which is great news. Any evidence of active infection which is great news and overall the wound is looking better. Upon evaluation today patient appears to be doing well with regard to her leg ulcer. She has been tolerating the dressing changes without complication. Fortunately I do not see 7/25; the patient's wound is on the right lateral lower extremity using silver alginate 3 layer compression. She has chronic venous insufficiency and lymphedema. We are using a juxta lite on the left leg which does not have any open wounds but she is leaving this on all week it seems that there just is not a way to get this changed at home 04-08-2022 upon evaluation today patient appears to be doing well currently in regard to her wound. She has been tolerating the dressing changes without complication. Fortunately I do not see any evidence of active infection locally or systemically which is great news. No fever or chills noted her left leg has reopened at this point. 04-15-2022 upon evaluation patient's wounds actually are showing signs of improvement which is great news. Little by little I do believe her making progress here. In regard to the right leg this is looking better though the measurement does not speak to it there is a lot of new skin growth. On the left leg this is significantly smaller compared to last week most of the blistered area has completely closed. 04-22-2022 upon evaluation today patient appears to be doing excellent in regard to her wounds both are showing signs of improvement. Her knees and above are still extremely swollen the legs were rewrapped them are down significantly. With that being said I do not think that we will get a be able to keep them that way once were not rapid noted that she really has no way to be able to put juxta lites on and off and nobody to help her with that. That is the biggest concern that we have here at this point. Fortunately I do  not think that there is any evidence of infection which is great news. 04-29-2022 upon evaluation today patient appears to be doing well with regard to her wounds. She has been tolerating the dressing changes without complication. Fortunately there does not appear to be any evidence of infection locally or systemically which is great news and overall I am extremely pleased in that regard. Left leg is healed the right leg is very close we discussed today the possibility of her granddaughter helping with putting on and off compression wraps for her. 05-06-2022 upon evaluation today patient appears to be doing well currently in regard to her left leg which is showing signs of being completely healed. With regard to her right leg she still has an open area though this is doing much better there is a lot less drainage than what we previously noted. Fortunately I see no evidence of active infection locally or  systemically at this time which is great news. 05-20-2022 upon evaluation today patient appears to be doing excellent in regard to her leg. The left leg is still healed the right leg is much better. Fortunately I see no signs of infection locally or systemically at this time which is great news. No fevers, chills, nausea, vomiting, or diarrhea. 05-27-2022 upon evaluation today patient appears to be doing excellent in regard to her wound this is actually measuring smaller and looking much better. Fortunately I see no signs of active infection locally or systemically at this time. 06-03-2022 upon evaluation patient's wound actually is measuring smaller and looking much better. She still has a lot of discomfort but fortunately nothing to significant at this point which is great news. No fevers, chills, nausea, vomiting, or diarrhea. I think we are making some pretty good progress here. 06-10-2022 upon evaluation today patient appears to be doing well currently in regard to her wound which is actually measuring  somewhat smaller. This is actually 2 separate wounds that are clustered and this is making it appear little bit larger than what it is but nonetheless we are seeing some definite improvement. Fortunately I do not see any signs of active infection at this time. No fevers, chills, nausea, vomiting, or diarrhea. 06-17-2022 upon evaluation today patient appears to be doing well currently in regard to her wound although she was in the hospital since she was last here. That was from 06-12-2022 through 06-16-2022 which was yesterday. This was due to a CHF exacerbation. With that being said the patient has noted in her record review acute on chronic congestive heart failure with hypertensive emergency noted. She also has stage IIIa chronic kidney disease noted. With that being said I do think her wound is much better because I got a lot of fluid off to try to diurese her to a degree but her kidneys would not hold for that they put her on furosemide 40 mg and that is helping to get the fluid off she is much better but still has a lot of fluid buildup. 06-24-2022 upon evaluation today patient appears to be doing well currently in regard to her wound is not significantly smaller but it is improved overall. Fortunately I do not see any signs of active infection locally nor systemically at this time. 07-01-2022 upon evaluation today patient appears to be doing okay in regard to her wound I do not believe the silver alginate is helping quite as much as I would like to see. I would actually recommend we switch back over to the [facility] at this point which I think may do better. Fortunately I do not see any signs of infection which is good news were going to switch to Tubigrip on the left leg on the right leg we will get a continue with the compression wrap. 07-08-2022 upon evaluation today patient unfortunately is not doing nearly as well in regard to her left leg. We had to put her back into a 4-layer  compression wrap today due to a significant blister that appeared. I do believe that we need to try to get this to reabsorb we will see how things do over the next week. With that being said the right leg is doing well the 4-layer compression wrap and switch to the [facility] seems to be very beneficial I am pleased with what we are seeing in that regard. 11/7; patient's wounds are on the right posterior lateral leg actually measures smaller and look  healthy we have been using Hydrofera Blue under 3 layer compression. Last week she had a large blister that was left intact covered with alginate but the leg was wrapped in 3 layer. Today the left leg had unroofed but the skin still looks reasonably healthy. Nevertheless she is still going to require the left leg to be wrapped Electronic Signature(s) Signed: 07/15/2022 5:01:28 PM By: Linton Ham MD Entered By: Linton Ham on 07/15/2022 15:03:46 [name] ([id number]) [id number] Page 5 of 12 -------------------------------------------------------------------------------- Physical Exam Details Patient Name: Date of Service: [name] 07/15/2022 2:00 PM Medical Record Number: [id number] Patient Account Number: [id number] Date of Birth/Sex: Treating RN: [birth year] (76 y.o. [name] Primary Care Provider: [name] Other Clinician: Referring Provider: Treating Provider/Extender: RO BSO N, MICHA EL G Aycock, Ngwe Weeks in Treatment: 92 Constitutional Patient is hypertensive.. Pulse regular and within target range for patient.Marland Kitchen Respirations regular, non-labored and within target range.. Temperature is normal and within the target range for the patient.Marland Kitchen appears in no distress. Notes Wound exam Right leg 2 small open areas with viable granulation. No depth. On the left the anterior blister has unroofed but the skin still looks reasonably healthy I will leave this intact for another  week to see if it needs to be removed or not No evidence of infection in either area Electronic Signature(s) Signed: 07/15/2022 5:01:28 PM By: Linton Ham MD Entered By: Linton Ham on 07/15/2022 15:04:40 -------------------------------------------------------------------------------- Physician Orders Details Patient Name: Date of Service: [name] 07/15/2022 2:00 PM Medical Record Number: [id number] Patient Account Number: [id number] Date of Birth/Sex: Treating RN: 1945-09-13 (76 y.o. [name] Primary Care Provider: [name] Other Clinician: Referring Provider: Treating Provider/Extender: RO BSO N, MICHA EL G Aycock, Ngwe Weeks in Treatment: 63 Verbal / Phone Orders: No Diagnosis Coding Follow-up Appointments Return Appointment in 1 week. Nurse Visit as needed Bathing/ Shower/ Hygiene May shower with wound dressing protected with water repellent cover or cast protector. No tub bath. Anesthetic (Use 'Patient Medications' Section for Anesthetic Order Entry) Lidocaine applied to wound bed Edema Control - Lymphedema / Segmental Compressive Device / Other Bilateral Lower Extremities Optional: One layer of unna paste to top of compression wrap (to act as an anchor). [name], [name] ([id number]) [id number] Page 6 of 12 3 Layer Compression System for Lymphedema. - right left Elevate, Exercise Daily and A void Standing for Long Periods of Time. Elevate legs to the level of the heart and pump ankles as often as possible Elevate leg(s) parallel to the floor when sitting. Compression Pump: Use compression pump on left lower extremity for 60 minutes, twice daily. - You may pump over wraps Compression Pump: Use compression pump on right lower extremity for 60 minutes, twice daily. - You may pump over wraps Wound Treatment Wound #4 - Lower Leg Wound Laterality: Right, Lateral Cleanser: Soap and Water 1 x Per Week/30 Days Discharge  Instructions: Gently cleanse wound with antibacterial soap, rinse and pat dry prior to dressing wounds Cleanser: Wound Cleanser 1 x Per Week/30 Days Discharge Instructions: Wash your hands with soap and water. Remove old dressing, discard into plastic bag and place into trash. Cleanse the wound with Wound Cleanser prior to applying a clean dressing using gauze sponges, not tissues or cotton balls. Do not scrub or use excessive force. Pat dry using gauze sponges, not tissue or cotton balls. Prim Dressing: Silvercel Small 2x2 (in/in) 1 x Per Week/30 Days ary Discharge Instructions: Apply Silvercel Small 2x2 (  in/in) as instructed Secondary Dressing: ABD Pad 5x9 (in/in) 1 x Per Week/30 Days Discharge Instructions: Cover with ABD pad Compression Wrap: 3-LAYER WRAP - Profore Lite LF 3 Multilayer Compression Bandaging System 1 x Per Week/30 Days Discharge Instructions: Apply 3 multi-layer wrap as prescribed. Electronic Signature(s) Signed: 07/15/2022 4:53:18 PM By: Carlene Coria RN Signed: 07/15/2022 5:01:28 PM By: Linton Ham MD Entered By: Carlene Coria on 07/15/2022 14:51:14 -------------------------------------------------------------------------------- Problem List Details Patient Name: Date of Service: [name] 07/15/2022 2:00 PM Medical Record Number: [id number] Patient Account Number: [id number] Date of Birth/Sex: Treating RN: [birth year] (76 y.o. 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 01/16/2021 No Yes lower extremity L97.812 Non-pressure chronic ulcer of other part of right lower leg with fat layer 01/07/2022 No  Yes exposed Inactive Problems [name], [name] ([id number]) [id number] Page 7 of 12 ICD-10 Code Description Active Date Inactive Date L97.822 Non-pressure chronic ulcer of other part of left lower leg with fat layer exposed 01/16/2021 01/16/2021 Resolved Problems Electronic Signature(s) Signed: 07/15/2022 5:01:28 PM By: Linton Ham MD Entered By: Linton Ham on 07/15/2022 15:01:37 -------------------------------------------------------------------------------- Progress Note Details Patient Name: Date of Service: [name] 07/15/2022 2:00 PM Medical Record Number: [id number] Patient Account Number: [id number] Date of Birth/Sex: Treating RN: 1946/06/13 (76 y.o. [name] Primary Care Provider: [name] Other Clinician: Referring Provider: Treating Provider/Extender: RO BSO N, MICHA EL G Aycock, Ngwe Weeks in Treatment: 110 Subjective History of Present Illness (HPI) 03/15/2020 upon evaluation today patient presents today for initial evaluation here in our clinic concerning a wound that is on the left posterior lower extremity. Unfortunately this has been giving the patient some discomfort at this point which she notes has been affected her pretty much daily. The patient does have a history of venous insufficiency, hypertension, congestive heart failure, and a history of breast cancer. Fortunately there does not appear to be evidence of active infection at this time which is great news. No fevers, chills, nausea, vomiting, or diarrhea. Wound is not extremely large but does have some slough covering the surface of the wound. This is going require sharp debridement today. 03/15/2020 upon evaluation today patient appears to be doing fairly well in regard to her wound. She does have some slough noted on the surface of the wound currently but I do believe the Iodoflex has been beneficial over the past week. There is no signs of active infection at  this time which is great news and overall very pleased with the progress. No fevers, chills, nausea, vomiting, or diarrhea. 04/02/2020 upon evaluation today patient appears to be doing a little better in regard to her wound size wise this is not tremendously smaller she does have some slough buildup on the surface of the wound. With that being said this is going require some sharp debridement today to clear away some of the necrotic debris. Fortunately there is no evidence of active infection at this time. No fevers, chills, nausea, vomiting, or diarrhea. 04/10/2020 on evaluation today patient appears to be doing well with regard to her ulcer which is measuring somewhat smaller today. She actually has more granulation tissue noted which is also good news is no need for sharp debridement today.  Fortunately there is no evidence of infection either which is also excellent. No fevers, chills, nausea, vomiting, or diarrhea. 04/26/2020 on evaluation today patient's wound actually is showing signs of good improvement which is great news. There does not appear to be any evidence of active infection. I do believe she is tolerating the collagen at this point. 05/03/2020 upon evaluation today patient's wound actually showed signs of good granulation at this time there does not appear to be any evidence of active infection which is great news and overall very pleased with where things stand. With that being said I do believe that the patient is can require some sharp debridement today but fortunately nothing too significant. 05/11/20 on evaluation today patient appears to be doing well in regard to her leg ulcer. She's been tolerating the dressing changes without complication. Fortunately there is no signs of active infection at this time. Overall I'm very pleased with where things stand. 05/18/2020 upon evaluation today patient's wound is showing signs of improvement is very dry and I think the collagen for the most  part just dried to the wound bed. I am going to clean this away with sharp debridement in order to get this under control in my opinion. 05/25/2020 upon evaluation today patient actually appears to be doing well in regard to her leg ulcer. In fact upon inspection it appears she could potentially be completely healed but that is not guaranteed based on what we are seeing. There does not appear to be any signs of active infection which is great news. 05/31/2020 on evaluation today patient appears to be doing well with regard to her wounds. She in fact appears to be almost completely healed on the left leg there is just a very small opening remaining point 1 06/08/2020 upon evaluation today patient appears to be doing excellent in regard to her leg ulcer on the left in fact it appears to be that she is completely healed as of today. I see no signs of active infection at this time which is great news. No fevers, chills, nausea, vomiting, or diarrhea. READMISSION 01/16/2021 Patient that we have had in this clinic with a wound on the left posterior calf in the summer 2021 extending into October. She was felt to have chronic venous [name], [name] ([id number]) [id number] Page 8 of 12 insufficiency. Per the patient's description she was discharged with what sounds like external compression stockings although she could not afford the "$75 per leg". She therefore did not get anything. Apparently her wound that she has currently is been in existence since January. She has been followed at vein and vascular with Unna boots and I think calcium alginate. She had ABIs done on 06/25/2020 that showed an noncompressible ABI on the right leg and the left leg but triphasic waveforms with good great toe pressures and a TBI of 0.90 on the right and 0.91 on the left Surprisingly the patient also had venous reflux studies that were really unremarkable. This included no evidence of DVT or SVT but  there was no evidence of s s deep venous insufficiency or superficial venous insufficiency in the greater saphenous or short saphenous veins bilaterally. I would wonder about more central venous issues or perhaps this is all lymphedema 01/25/2021 upon evaluation today patient appears to be doing decently well and guard to her wound. The good news is the Iodoflex that is job she saw Dr. Dellia Nims last week for readmission and to be honest I think that this has done extremely  well over that week. I do believe that she can tolerate the sharp debridement today which will be good. I think that cleaning off the wound will likely allow [place] to be able to use a different type of dressing I do not think the Iodoflex will even be necessary based on how clean the wound looks. Fortunately there is no sign of active infection at this time which is great news. No fevers, chills, nausea, vomiting, or diarrhea. 02/12/2021 upon evaluation today patient appears to be doing well with regard to her leg ulcer. We did switch to alginate when she came for nurse visit I think is doing much better for her. Fortunately there does not appear to be any signs of active infection at this time which is great news. No fevers, chills, nausea, vomiting, or diarrhea. 02/18/2021 upon evaluation today patient's wound is actually showing signs of good improvement. I am very pleased with where things stand currently. I do not see any signs of active infection which is great news and overall I feel like she is making good progress. The patient likewise is happy that things are doing so well and that this is measuring somewhat smaller. 02/25/2021 upon evaluation today patient actually appears to be doing quite well in regard to her wounds. She has been tolerating the dressing changes without complication. Fortunately there does not appear to be any signs of active infection which is great news and overall I am extremely pleased with where  things stand. No fevers, chills, nausea, vomiting, or diarrhea. She does have a lot of edema I really do think she needs compression socks to be worn in order to prevent things from causing additional issues for her currently. Especially on the right leg she should be wearing compression socks all the time to be honest. 03/04/2021 upon evaluation today patient's leg ulcer actually appears to be doing pretty well which is great news. There does not appear to be any significant change but overall the appearance is better even though the size is not necessarily reflecting a great improvement. Fortunately there does not appear to be any signs of active infection. No fevers, chills, nausea, vomiting, or diarrhea. 03/12/2021 upon evaluation today patient appears to be doing well with regard to her wounds. She has been tolerating the dressing changes without complication. The good news is that the wound on her left lateral leg is doing much better and is showing signs of good improvement. Overall her swelling is controlled well as well. She does need some compression socks she plans to go Jefferey socks next week. 03/18/2021 on evaluation today patient's wound actually is showing signs of good improvement. She does have a little bit of slough this can require some sharp debridement today. 03/25/2021 upon evaluation today patient appears to be doing well with regard to her wound on the left lateral leg. She has been tolerating the dressing changes without complication. Fortunately there does not appear to be any signs of active infection at this time. No fever chills no 04/01/2021 upon evaluation today patient's wound is showing signs of improvement and overall very pleased with where things stand at this point. There is no evidence of active infection which is great news as well and in general I think that she is making great progress. 04/09/2021 upon evaluation today patient appears to be doing well with regard to  her ankle ulcer. She is making good progress currently which is great news. There does not appear to be any signs of  infection also excellent news. In general I am extremely pleased with where things stand today. No fevers, chills, nausea, vomiting, or diarrhea. 04/23/2021 upon inspection today patient appears to be doing quite well with regard to her leg ulcer. She has been tolerating the dressing changes without complication. She is can require some sharp debridement today but overall seems to be doing excellent. 05/06/2021 upon evaluation today patient unfortunately appears to be doing poorly overall in regard to her swelling. She is actually significantly more swollen than last week despite the fact that her wound is doing better this is not a good trend. I think that she probably needs to see her cardiologist ASAP and I discussed that with her today. This includes the fact that honestly I think she probably is getting need an increase in her diuretic or something to try to clear away some of the excess fluid that she is experiencing at the moment. She is not been doing her pumps even twice a day but again right now more concerned that if she were to do the pump she would fluid overload her lungs causing other issues as far as her breathing is concerned. Obviously I think she needs to contact them and move up the appointment for the 12th of something sooner 2 weeks is a bit too long to wait with how swollen she has. 05/14/2021 upon evaluation today patient's wound actually showing signs of being a little bit smaller compared to previous. She has been making good progress however. Fortunately there does not appear to be any evidence of active infection which is great. Overall I am extremely pleased in that regard. Nonetheless I am still concerned that she is having quite a bit of issue as far as the swelling is concerned she has been placed on fluid restriction as well as an increase in the furosemide  by her doctor. We will see how things progress. 05/21/2021 upon evaluation today patient appears to be doing well with regard to her wound in general. She has been tolerating the dressing changes without complication. With that being said she has been using silver cell for a while and while this was showing signs of improvement it is now gotten to the point where this has slowed down quite significantly. I do believe that switching to a different dressing may be beneficial for her today. 05/28/2021 upon evaluation today patient actually appears to be doing quite well in regard to her wound. In fact there is really not any need for sharp debridement today based on what I see. The Hydrofera Blue is doing great and overall I think that she is managing quite nicely with the compression wrapping. Her edema is still down quite a bit with the wrapping in place. 06/11/2021 upon evaluation today patient appears to be doing well with regard to her leg ulcer. She has been tolerating the dressing changes without complication. Fortunately there does not appear to be any signs of active infection at this time which is great news. No fevers, chills, nausea, vomiting, or diarrhea. She is going require some sharp debridement today. 10/11; left leg ulcer much better looking and with improved surface area. She is using Hydrofera Blue under 3 layer compression. She does not have any stocking on the right leg which in itself has very significant nonpitting edema 06/25/2021 upon evaluation today patient appears to be doing well with regard to the wound on her left leg. She has been tolerating the dressing changes without complication and overall I am  extremely pleased with where things stand today. I do not see any evidence of infection which is great news. 07/02/2021 upon evaluation today patient's wound actually showing signs of excellent improvement and actually very pleased with where we stand today and the overall  appearance. Fortunately there does not appear to be any signs of active infection. No fevers, chills, nausea, vomiting, or diarrhea. 07/09/2021 upon evaluation today patient appears to be doing better in regard to her wound this is measuring smaller and she is headed in the appropriate direction based on what I am seeing currently. I do not see any evidence of active infection systemically which is great news. 07/16/2021 upon evaluation today patient appears to be doing pretty well currently in regard to her leg ulcer. This is measuring smaller and looking much better this is great news. Fortunately there does not appear to be any evidence of active infection systemically which is great news as well. 07/23/2021 upon evaluation today patient's wound is actually showing signs of good improvement this is definitely measuring smaller. I do not see any signs of infection which is great news and overall very pleased in that regard. Overall I think that she is doing well with the [facility]. [name], [name] ([id number]) [id number] Page 9 of 12 07/30/2020 upon evaluation today patient appears to be doing well in regard to her wound. She has been tolerating the dressing changes without complication think the Hydrofera Blue at this point is actually causing her to dry out a lot as far as the wound bed is concerned. Subsequently I think that we need to try to see what we can do to improve this. Fortunately there does not appear to be any evidence of active infection at this time which is great news. 08/06/2021 upon evaluation today patient appears to be doing well with regards to her wound. Is not measuring significantly smaller initial inspection but upon closer inspection she has a lot of new skin growth across the central portion of the wound. I think will get very close to complete resolution. 08/13/2021 upon evaluation today patient appears to be doing well with regard to her  wound. In fact this appears to be I believe healed although there is still some question as to whether it is completely so. I am concerned about the fact that to be honest she still has some evidence of dry skin that could be just trapping something underneath I still want to debride anything as I am afraid of causing some damage to the good skin for that reason I Georgina Peer probably monitor for 1 more week before completely closing everything out. 08/20/2021 upon evaluation today patient actually appears to be doing excellent. In fact she appears to be completely healed. This was the case last week as well we just wanted to make sure nothing reopened since she does not really have an ability to put on compression socks this is good to be something we need to make sure was well-healed. Nonetheless I think we have achieved that goal as of today. 12/27; this is a patient we discharged 2 weeks ago with wounds on her left lower leg laterally. She was supposed to get stockings and really did not get them. She developed blistering and reopening probably sometime late last week. She has 3 areas in the same area as previously. The mid calf dimensions on the left went from 39 to 51 cm today. She also has very significant edema on the right leg but as far as I  am aware has not had wounds in this area 1/4; patient presents for follow-up. She has tolerated the compression wrap well. She has no issues or complaints today. 09/17/2021 upon evaluation today patient appears to be doing well with regard to her wounds. She has been tolerating the dressing changes without complication. Fortunately there does not appear to be any signs of active infection at this time. No fevers, chills, nausea, vomiting, or diarrhea. 10/01/2021 upon evaluation today patient appears to be doing well with regard to her wound this is actually measuring better and looking smaller and I am very pleased in that regard. Fortunately I do not see any  evidence of active infection locally nor systemically at this point. No fevers, chills, nausea, vomiting, or diarrhea. 10/08/2021 upon evaluation patient's wounds are actually showing some signs of improvement measuring a little bit smaller and looking a little bit better. Overall I do not think there is any need for sharp debridement today which is good news. No fevers, chills, nausea, vomiting, or diarrhea. 10/14/2021 upon evaluation today patient appears to be doing well with regard to her wound. She has been tolerating the dressing changes without complication and overall I am extremely pleased with where things stand today. There is a little bit of film on the surface of the wounds but this was carefully cleaned away with just saline and gauze she really did not want me to do any sharp debridement today. 10/29/2021 upon evaluation today patient appears to be doing quite well in regard to her wounds. She is actually showing signs of excellent improvement which is great news and overall I feel like we are on the right track at this time. She does have a wound on both legs and she does need something compression wise when she heals for that reason we will get a go and see about ordering her bilateral juxta lite compression wraps. 11/05/2021 upon evaluation today patient appears to be doing well with regard to her wound. She has been tolerating the dressing changes without complication. Fortunately I do not see any evidence of active infection locally nor systemically at this time which is great news. No fevers, chills, nausea, vomiting, or diarrhea. 11/12/2021 upon evaluation today patient appears to be doing well with regard to her wounds. She is doing excellent on the left on the right this is not doing quite as well there is some slough and biofilm buildup. 3/14; patient presents for follow-up. She has no issues or complaints today. She has tolerated the compression wraps well. 11/26/2021 upon  evaluation today patient appears to be doing well with regard to her wounds. Both are showing signs of good improvement which is great news. I do not see any evidence of active infection and overall think she is healing quite nicely. 12/03/2021 upon evaluation today patient appears to be doing well with regard to her wounds. Both are showing signs of excellent improvement I am very pleased with where things stand and I think that she is making good progress here. Fortunately I do not see any signs of active infection locally or systemically which is great news. No fevers, chills, nausea, vomiting, or diarrhea. 12-10-2021 upon evaluation today patient's wounds actually appear to be doing awesome. I am extremely pleased with where we stand I think she is making excellent progress. I do not see any signs of active infection locally or systemically at this time. 12-17-2021 upon evaluation today patient's wounds are actually showing signs of good improvement. Fortunately I do  not see any evidence of active infection locally nor systemically which is great news. Overall I do not believe that she is showing any signs of significant worsening which is good news. With that being said I do believe that she has a little bit of film on the surface of the wound I was actually able to clear this away with saline and gauze no sharp debridement was necessary. 12-24-2021 upon evaluation today patient appears to be doing well with regard to her wounds. I do not see any signs of active infection currently which is great news and overall I think that we are on the right track here. No fevers, chills, nausea, vomiting, or diarrhea. 12-31-2021 upon evaluation today patient appears to be doing well with regard to her wounds. Both are showing signs of improvement the wound on the right leg is going require some sharp debridement on the left seems to be doing quite well. 01-07-2022 upon evaluation today patient appears to be doing  well currently in regard to her wounds. She is actually making good progress bilaterally. Both of them require little bit of sharp debridement but not too much which is great news. Upon inspection patient's wound bed actually showed signs of good granulation and epithelization on both legs. She still is having significant issues with the right leg compared to the left although I do feel like both are actually showing signs of pretty good improvement. 01-21-2022 upon evaluation today patient's wound is actually showing signs of excellent improvement I am very pleased with where we stand currently. I do not see any signs of active infection locally or systemically which is great news. No fevers, chills, nausea, vomiting, or diarrhea. 01-28-2022 upon evaluation today patient appears to be doing well with regard to her wound on the left leg this is pretty much just about healed which is great news. Unfortunately in regard to the wound on her right leg this is not doing nearly as well in fact I think we probably need to see about a culture today that was discussed with her. 02-04-2022 upon evaluation today patient appears to be doing well with regard to her left leg which in fact might actually be completely healed I am not 100% sure. Nonetheless this is shown signs of excellent improvement which is great news. With that being said in regards to the right leg there is some slough and film buildup on the surface of the wound although she is feeling much better than last week this does appear to be doing much better than it was last week. Fortunately there does not appear to be any signs of active infection locally or systemically at this time. 02-11-2022 upon evaluation today patient's wounds appear to be doing decently well. In fact the left leg is healed although there is brand-new skin were definitely given still need to wrap her for the time being. On the right leg this is showing signs of some epithelial  growth in the middle of the wound it still measures the Buckholts, PRISMA ([id number]) [id number] Page 10 of 12 same because there is still a larger area with speckled openings but again as far as the entire area being open this is significantly improved which is great news. I am very pleased. 6/13; left leg remains healed and she has a juxta light to apply to it today. There is some concern about whether she is going to be able to do this at home. She lives with a daughter although she has  her own disability. On the right the wound looks smaller to me nice epithelialization good edema control. 02-25-2022 upon evaluation today patient appears to be doing well with regard to her legs the left leg is still healed the right leg is doing much better. I think we are on the right track here. With that being said unfortunately she continues to have significant issues with some other problems and she actually tells me on Saturday she was having lunch and went inside to sit down when she tells me she had all of a sudden a sharp sensation that went from her hand through her arm into her leg and her neck and she tells me that she could not see anything for the next hour it was completely black. Slowly after this her vision started to return and has been normal since. It sounds to me as if she may have had a TIA or mini stroke at this point. Nonetheless I think that she needs to have this checked out ASAP. Her primary care provider is Dr. Clide Deutscher at [facility]. We will get a try to get in touch with him. 03-10-2022 upon evaluation today patient's wound actually showed signs of good improvement. She seems to be making excellent progress here. I do not see any evidence of active infection locally or systemically which is great news. No fevers, chills, nausea, vomiting, or diarrhea. She was in the hospital since have seen her last she ended up finding out that she was having syncopal  episodes. That is what was going on with the blacking out and losing vision. Subsequently they adjusted some of her medication she seems to be doing better. 03-18-2022 upon evaluation today patient's wound is actually showing signs of good granulation and epithelization at this point. Fortunately I do not see any signs of active infection locally or systemically which is great news. 03-25-2022 any evidence of active infection locally or systemically which is great news. Any evidence of active infection which is great news and overall the wound is looking better. Upon evaluation today patient appears to be doing well with regard to her leg ulcer. She has been tolerating the dressing changes without complication. Fortunately I do not see 7/25; the patient's wound is on the right lateral lower extremity using silver alginate 3 layer compression. She has chronic venous insufficiency and lymphedema. We are using a juxta lite on the left leg which does not have any open wounds but she is leaving this on all week it seems that there just is not a way to get this changed at home 04-08-2022 upon evaluation today patient appears to be doing well currently in regard to her wound. She has been tolerating the dressing changes without complication. Fortunately I do not see any evidence of active infection locally or systemically which is great news. No fever or chills noted her left leg has reopened at this point. 04-15-2022 upon evaluation patient's wounds actually are showing signs of improvement which is great news. Little by little I do believe her making progress here. In regard to the right leg this is looking better though the measurement does not speak to it there is a lot of new skin growth. On the left leg this is significantly smaller compared to last week most of the blistered area has completely closed. 04-22-2022 upon evaluation today patient appears to be doing excellent in regard to her wounds both are  showing signs of improvement. Her knees and above are still extremely swollen the  legs were rewrapped them are down significantly. With that being said I do not think that we will get a be able to keep them that way once were not rapid noted that she really has no way to be able to put juxta lites on and off and nobody to help her with that. That is the biggest concern that we have here at this point. Fortunately I do not think that there is any evidence of infection which is great news. 04-29-2022 upon evaluation today patient appears to be doing well with regard to her wounds. She has been tolerating the dressing changes without complication. Fortunately there does not appear to be any evidence of infection locally or systemically which is great news and overall I am extremely pleased in that regard. Left leg is healed the right leg is very close we discussed today the possibility of her granddaughter helping with putting on and off compression wraps for her. 05-06-2022 upon evaluation today patient appears to be doing well currently in regard to her left leg which is showing signs of being completely healed. With regard to her right leg she still has an open area though this is doing much better there is a lot less drainage than what we previously noted. Fortunately I see no evidence of active infection locally or systemically at this time which is great news. 05-20-2022 upon evaluation today patient appears to be doing excellent in regard to her leg. The left leg is still healed the right leg is much better. Fortunately I see no signs of infection locally or systemically at this time which is great news. No fevers, chills, nausea, vomiting, or diarrhea. 05-27-2022 upon evaluation today patient appears to be doing excellent in regard to her wound this is actually measuring smaller and looking much better. Fortunately I see no signs of active infection locally or systemically at this time. 06-03-2022  upon evaluation patient's wound actually is measuring smaller and looking much better. She still has a lot of discomfort but fortunately nothing to significant at this point which is great news. No fevers, chills, nausea, vomiting, or diarrhea. I think we are making some pretty good progress here. 06-10-2022 upon evaluation today patient appears to be doing well currently in regard to her wound which is actually measuring somewhat smaller. This is actually 2 separate wounds that are clustered and this is making it appear little bit larger than what it is but nonetheless we are seeing some definite improvement. Fortunately I do not see any signs of active infection at this time. No fevers, chills, nausea, vomiting, or diarrhea. 06-17-2022 upon evaluation today patient appears to be doing well currently in regard to her wound although she was in the hospital since she was last here. That was from 06-12-2022 through 06-16-2022 which was yesterday. This was due to a CHF exacerbation. With that being said the patient has noted in her record review acute on chronic congestive heart failure with hypertensive emergency noted. She also has stage IIIa chronic kidney disease noted. With that being said I do think her wound is much better because I got a lot of fluid off to try to diurese her to a degree but her kidneys would not hold for that they put her on furosemide 40 mg and that is helping to get the fluid off she is much better but still has a lot of fluid buildup. 06-24-2022 upon evaluation today patient appears to be doing well currently in regard to her wound is  not significantly smaller but it is improved overall. Fortunately I do not see any signs of active infection locally nor systemically at this time. 07-01-2022 upon evaluation today patient appears to be doing okay in regard to her wound I do not believe the silver alginate is helping quite as much as I would like to see. I would actually recommend we  switch back over to the [facility] at this point which I think may do better. Fortunately I do not see any signs of infection which is good news were going to switch to Tubigrip on the left leg on the right leg we will get a continue with the compression wrap. 07-08-2022 upon evaluation today patient unfortunately is not doing nearly as well in regard to her left leg. We had to put her back into a 4-layer compression wrap today due to a significant blister that appeared. I do believe that we need to try to get this to reabsorb we will see how things do over the next week. With that being said the right leg is doing well the 4-layer compression wrap and switch to the [facility] seems to be very beneficial I am pleased with what we are seeing in that regard. 11/7; patient's wounds are on the right posterior lateral leg actually measures smaller and look healthy we have been using Hydrofera Blue under 3 layer compression. Last week she had a large blister that was left intact covered with alginate but the leg was wrapped in 3 layer. Today the left leg had unroofed but the skin still looks reasonably healthy. Nevertheless she is still going to require the left leg to be wrapped [name], [name] ([id number]) [id number] Page 11 of 12 Objective Constitutional Patient is hypertensive.. Pulse regular and within target range for patient.Marland Kitchen Respirations regular, non-labored and within target range.. Temperature is normal and within the target range for the patient.Marland Kitchen appears in no distress. Vitals Time Taken: 2:13 PM, Height: 69 in, Weight: 244 lbs, BMI: 36, Temperature: 98.2 F, Pulse: 72 bpm, Respiratory Rate: 18 breaths/min, Blood Pressure: 168/78 mmHg. General Notes: Wound exam oo Right leg 2 small open areas with viable granulation. No depth. oo On the left the anterior blister has unroofed but the skin still looks reasonably healthy I will leave this intact for  another week to see if it needs to be removed or not oo No evidence of infection in either area Integumentary (Hair, Skin) Wound #4 status is Open. Original cause of wound was Gradually Appeared. The date acquired was: 10/27/2021. The wound has been in treatment 37 weeks. The wound is located on the Right,Lateral Lower Leg. The wound measures 5cm length x 1.5cm width x 0.1cm depth; 5.89cm^2 area and 0.589cm^3 volume. There is Fat Layer (Subcutaneous Tissue) exposed. There is no tunneling or undermining noted. There is a medium amount of serosanguineous drainage noted. There is medium (34-66%) pink granulation within the wound bed. There is a medium (34-66%) amount of necrotic tissue within the wound bed including Adherent Slough. 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 There was a Three Layer Compression Therapy Procedure by Carlene Coria, RN. Post procedure Diagnosis Wound #4: Same as Pre-Procedure There was a Three Layer Compression Therapy Procedure by Epps,  Morey Hummingbird, Therapist, sports. Post procedure Diagnosis Wound #: Same as Pre-Procedure Plan Follow-up Appointments: Return Appointment in 1 week. Nurse Visit as needed Bathing/ Shower/ Hygiene: May shower with wound dressing protected with water repellent cover or cast protector. No tub bath. Anesthetic (Use 'Patient Medications' Section for Anesthetic Order Entry): Lidocaine applied to wound bed Edema Control - Lymphedema / Segmental Compressive Device / Other: Optional: One layer of unna paste to top of compression wrap (to act as an anchor). 3 Layer Compression System for Lymphedema. - right left Elevate, Exercise Daily and  Avoid Standing for Long Periods of Time. Elevate legs to the level of the heart and pump ankles as often as possible Elevate leg(s) parallel to the floor when sitting. Compression Pump: Use compression pump on left lower extremity for 60 minutes, twice daily. - You may pump over wraps Compression Pump: Use compression pump on right lower extremity for 60 minutes, twice daily. - You may pump over wraps WOUND #4: - Lower Leg Wound Laterality: Right, Lateral Cleanser: Soap and Water 1 x Per Week/30 Days Discharge Instructions: Gently cleanse wound with antibacterial soap, rinse and pat dry prior to dressing wounds Cleanser: Wound Cleanser 1 x Per Week/30 Days Discharge Instructions: Wash your hands with soap and water. Remove old dressing, discard into plastic bag and place into trash. Cleanse the wound with Wound Cleanser prior to applying a clean dressing using gauze sponges, not tissues or cotton balls. Do not scrub or use excessive force. Pat dry using gauze sponges, not tissue or cotton balls. Prim Dressing: Silvercel Small 2x2 (in/in) 1 x Per Week/30 Days ary Discharge Instructions: Apply Silvercel Small 2x2 (in/in) as instructed Secondary Dressing: ABD Pad 5x9 (in/in) 1 x Per Week/30 Days Discharge Instructions: Cover with ABD pad Com pression Wrap: 3-LAYER WRAP - Profore Lite LF 3 Multilayer Compression Bandaging System 1 x Per Week/30 Days Discharge Instructions: Apply 3 multi-layer wrap as prescribed. [name], [name] ([id number]) [id number] Page 12 of 12 1. We continued with the alginate on the left leg blistered area under 3 layer compression 2. Still Hydrofera Blue on the wounds on the right again under 3 layer compression. These wounds look better Electronic Signature(s) Signed: 07/15/2022 5:01:28 PM By: Linton Ham MD Entered By: Linton Ham on 07/15/2022  15:05:29 -------------------------------------------------------------------------------- SuperBill Details Patient Name: Date of Service: [name] 07/15/2022 Medical Record Number: [id number] Patient Account Number: [id number] Date of Birth/Sex: Treating RN: 18-Dec-1945 (76 y.o. 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

## 2022-07-21 ENCOUNTER — Inpatient Hospital Stay: Payer: Medicare Other

## 2022-07-21 ENCOUNTER — Encounter: Payer: Self-pay | Admitting: Oncology

## 2022-07-21 ENCOUNTER — Inpatient Hospital Stay (HOSPITAL_BASED_OUTPATIENT_CLINIC_OR_DEPARTMENT_OTHER): Payer: Medicare Other | Admitting: Oncology

## 2022-07-21 VITALS — BP 123/52 | HR 50 | Resp 20 | Wt 247.0 lb

## 2022-07-21 DIAGNOSIS — Z87891 Personal history of nicotine dependence: Secondary | ICD-10-CM | POA: Diagnosis not present

## 2022-07-21 DIAGNOSIS — C50411 Malignant neoplasm of upper-outer quadrant of right female breast: Secondary | ICD-10-CM | POA: Diagnosis present

## 2022-07-21 DIAGNOSIS — N289 Disorder of kidney and ureter, unspecified: Secondary | ICD-10-CM | POA: Diagnosis not present

## 2022-07-21 DIAGNOSIS — Z803 Family history of malignant neoplasm of breast: Secondary | ICD-10-CM | POA: Diagnosis not present

## 2022-07-21 DIAGNOSIS — Z79811 Long term (current) use of aromatase inhibitors: Secondary | ICD-10-CM | POA: Diagnosis not present

## 2022-07-21 DIAGNOSIS — E042 Nontoxic multinodular goiter: Secondary | ICD-10-CM

## 2022-07-21 DIAGNOSIS — E079 Disorder of thyroid, unspecified: Secondary | ICD-10-CM | POA: Diagnosis not present

## 2022-07-21 DIAGNOSIS — N2889 Other specified disorders of kidney and ureter: Secondary | ICD-10-CM

## 2022-07-21 DIAGNOSIS — Z170 Estrogen receptor positive status [ER+]: Secondary | ICD-10-CM | POA: Diagnosis not present

## 2022-07-21 NOTE — Progress Notes (Signed)
  Pt states she has been experiencing chest pain since last night; making it diffcult to get dressed this morning and still having /some pain. Pain level currently is 7/10. As well as, stomach constantly feels "sore" causing pt to not eat. Pt states last week her left leg was severely swollen.

## 2022-07-21 NOTE — Progress Notes (Signed)
Hematology/Oncology Consult note [facility]  Telephone:([phone] [phone]  Patient Care Team: Donnie Coffin, MD as PCP - General (Family Medicine) Minna Merritts, MD as Consulting Physician (Cardiology)   Name of the patient: [name]  [id number]  1945-12-22   Date of visit: 07/21/22  Diagnosis-stage I right breast cancer  Chief complaint/ Reason for visit- discuss PET scan results and further management  Heme/Onc history: patient is a 76 year old female with a prior history of left breast cancer in 2006.  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  Mammogram and ultrasound in August 2019 showed 0.9 x 0.9 x 0.8 cm irregular spiculated mass in the 11 o'clock position of the right breast.  There was a concern for an abnormal intramammary lymph node as well.  No other abnormal axillary adenopathy noted.  Patient underwent ultrasound-guided biopsy of both the right breast mass as well as the axillary lymph node lymph node biopsy was negative for malignancy.  Biopsy of the right breast mass showed 6 mm, grade 1 ER PR positive and HER-2/neu negative tumor.   Patient is morbidly obese and has difficulty with ambulation.  She uses a cane or a walker to ambulate around the house.  She sees Dr. Rockey Situ for her cardiology issues.  Reports that her appetite is good and she denies any unintentional weight loss or any new aches or pains anywhere.  Family history significant for breast cancer in her sister who died from breast cancer in her 30s.  genetic testing was negative   Final pathology showed 7 mm grade 2 invasive mammary carcinoma ER PR positive her 2 negative. Negative nodes. Clear margins   Oncotype score came as low risk 8 and she does not require adjuvant chemotherapy She completed adjuvant radiation and started Arimidex in December 2019      Interval history-patient is doing overall  poorly at home.  She reports chronic fatigue and back pain as well as difficulty with her mobility.  She lives with her daughter who also is overweight and has issues with mobility.  ECOG PS- 3 Pain scale- 4 Opioid associated constipation- no  Review of systems- Review of Systems  Constitutional:  Positive for malaise/fatigue. Negative for chills, fever and weight loss.  HENT:  Negative for congestion, ear discharge and nosebleeds.   Eyes:  Negative for blurred vision.  Respiratory:  Negative for cough, hemoptysis, sputum production, shortness of breath and wheezing.   Cardiovascular:  Positive for leg swelling. Negative for chest pain, palpitations, orthopnea and claudication.  Gastrointestinal:  Negative for abdominal pain, blood in stool, constipation, diarrhea, heartburn, melena, nausea and vomiting.  Genitourinary:  Negative for dysuria, flank pain, frequency, hematuria and urgency.  Musculoskeletal:  Negative for back pain, joint pain and myalgias.  Skin:  Negative for rash.  Neurological:  Negative for dizziness, tingling, focal weakness, seizures, weakness and headaches.  Endo/Heme/Allergies:  Does not bruise/bleed easily.  Psychiatric/Behavioral:  Negative for depression and suicidal ideas. The patient does not have insomnia.       No Known Allergies   Past Medical History:  Diagnosis Date   Abnormality of gait    Acute respiratory failure with hypoxia ([place]) 06/13/2022   Breast cancer ([place]) 02/05/2005   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    Breast cancer ([place]) 2019   right breast   CHF (congestive heart failure) (  [place])    March 28, 2017 echo: EF 50-55%; 1) echo 07/2011: EF 20-25%, mild concentric hypertrophy, diffuse HK, regional WMA cannot be excluded, grade 1 DD, mitral valve with mild regurg, LA mildly dilated). 2) EF 40-45%, mild AS,                     Chronic kidney disease     Complication of anesthesia 2006   pt states "hard to wake up" after L breast surgery    Edema    GERD (gastroesophageal reflux disease)    Headache    Heart murmur    Hypertension    Hypokinesis    global, EF 35-45 % from Echo.   Malignant neoplasm of breast (female), unspecified site 06/14/2018   RIGHT T1b, N0; ER/PR positive, HER-2 negative.   Neuropathy    Obesity    Pain in joint, site unspecified    Personal history of chemotherapy 2006   left breast   Personal history of radiation therapy 2019   right breast   Personal history of radiation therapy 2006   left breast   Pneumonia    Sleep apnea    Tinea pedis    Unspecified hearing loss    bilateral     Past Surgical History:  Procedure Laterality Date   BREAST BIOPSY Right 2019   11 oc-INVASIVE MAMMARY CARCINOMA, NO SPECIAL TYPE   BREAST BIOPSY Right 2019   1030 oc- neg   BREAST LUMPECTOMY  2006   left breast    CATARACT EXTRACTION     left eye   HERNIA REPAIR  05/25/2006   Incarcerated omentum in ventral hernia, Ventralight mesh, combined lap/ open with omental resection.    PARTIAL MASTECTOMY WITH NEEDLE LOCALIZATION Right 06/14/2018   Procedure: PARTIAL MASTECTOMY WITH NEEDLE LOCALIZATION;  Surgeon: Robert Bellow, MD;  Location: [facility] ORS;  Service: General;  Laterality: Right;   PORT-A-CATH REMOVAL  05/25/2006   SENTINEL NODE BIOPSY Right 06/14/2018   Procedure: SENTINEL NODE BIOPSY;  Surgeon: Robert Bellow, MD;  Location: [facility] ORS;  Service: General;  Laterality: Right;   TUBAL LIGATION      Social History   Socioeconomic History   Marital status: Divorced    Spouse name: Not on file   Number of children: Not on file   Years of education: Not on file   Highest education level: Not on file  Occupational History   Occupation: retired  Tobacco Use   Smoking status: Former    Packs/day: 0.50    Years: 30.00    Total pack years: 15.00    Types: Cigarettes    Quit date: 07/14/2000    Years  since quitting: 22.0   Smokeless tobacco: Never  Vaping Use   Vaping Use: Never used  Substance and Sexual Activity   Alcohol use: No   Drug use: Not Currently    Types: Marijuana    Comment: endorses prior marijuana use 1999   Sexual activity: Never  Other Topics Concern   Not on file  Social History Narrative   Not on file   Social Determinants of Health   Financial Resource Strain: Not on file  Food Insecurity: No Food Insecurity (06/13/2022)   Hunger Vital Sign    Worried About Running Out of Food in the Last Year: Never true    Ran Out of Food in the Last Year: Never true  Transportation Needs: No Transportation Needs (06/13/2022)   [place] - Transportation  Lack of Transportation (Medical): No    Lack of Transportation (Non-Medical): No  Physical Activity: Not on file  Stress: Not on file  Social Connections: Not on file  Intimate Partner Violence: Not At Risk (06/13/2022)   Humiliation, Afraid, Rape, and Kick questionnaire    Fear of Current or Ex-Partner: No    Emotionally Abused: No    Physically Abused: No    Sexually Abused: No    Family History  Problem Relation Age of Onset   Hypertension Mother        deceased 71   Cataracts Mother    Breast cancer Other 73       maternal half-sister; deceased 24   Coronary artery disease Other      Current Outpatient Medications:    acetaminophen-codeine (TYLENOL #3) 300-30 MG tablet, Take 1-2 tablets by mouth every 4 (four) hours as needed for moderate pain., Disp: 30 tablet, Rfl: 0   anastrozole (ARIMIDEX) 1 MG tablet, TAKE 1 TABLET BY MOUTH ONCE DAILY. PTAIENT MUST MAKE AN APPOINTMENT TO SEE DR Kemp Gomes, Disp: 90 tablet, Rfl: 0   aspirin EC 81 MG tablet, Take 81 mg by mouth daily., Disp: , Rfl:    cloNIDine (CATAPRES) 0.1 MG tablet, Take 1 tablet (0.1 mg total) by mouth 2 (two) times daily as needed (systolic BP > 235 or diastolic BP > 95)., Disp: 60 tablet, Rfl: 0   [employer] CALCIUM + D3 600-20 MG-MCG TABS, TAKE 1 TABLET  BY MOUTH TWICE A DAY, Disp: 120 tablet, Rfl: 1   diclofenac Sodium (VOLTAREN) 1 % GEL, Apply 1 application topically as needed., Disp: , Rfl:    Docusate Sodium 100 MG capsule, Take 100 mg by mouth 2 (two) times daily as needed., Disp: , Rfl:    Incontinence Supply Disposable (BLADDER CONTROL PADS EX ABSORB) MISC, Apply 1 Dose topically as needed. , Disp: , Rfl:    isosorbide mononitrate (IMDUR) 60 MG 24 hr tablet, Take 1 tablet (60 mg total) by mouth 2 (two) times daily., Disp: 180 tablet, Rfl: 3   loratadine (CLARITIN) 10 MG tablet, Take 10 mg by mouth daily., Disp: , Rfl:    losartan (COZAAR) 25 MG tablet, Take 1 tablet (25 mg total) by mouth daily., Disp: 90 tablet, Rfl: 3   methocarbamol (ROBAXIN) 500 MG tablet, Take by mouth., Disp: , Rfl:    MIRALAX 17 GM/SCOOP powder, SMARTSIG:2 Scoopful By Mouth Daily PRN, Disp: , Rfl:    omeprazole (PRILOSEC) 40 MG capsule, Take 40 mg by mouth daily., Disp: , Rfl:    oxybutynin (DITROPAN) 5 MG tablet, Take 5 mg by mouth 2 (two) times daily., Disp: , Rfl:    potassium chloride SA (KLOR-CON) 20 MEQ tablet, Take 1 tablet (20 mEq total) by mouth 2 (two) times daily., Disp: 180 tablet, Rfl: 3   Torsemide 40 MG TABS, Take 40 mg by mouth daily in the afternoon., Disp: 30 tablet, Rfl: 0  Physical exam:  Vitals:   07/21/22 1415  BP: (!) 123/52  Pulse: (!) 50  Resp: 20  SpO2: 100%  Weight: 247 lb (112 kg)   Physical Exam Constitutional:      Comments: Obese, appears in no acute distress  Cardiovascular:     Rate and Rhythm: Normal rate and regular rhythm.     Heart sounds: Normal heart sounds.  Pulmonary:     Effort: Pulmonary effort is normal.     Breath sounds: Normal breath sounds.  Abdominal:     General: Bowel  sounds are normal.     Palpations: Abdomen is soft.  Musculoskeletal:     Right lower leg: Edema present.     Left lower leg: Edema present.  Skin:    General: Skin is warm and dry.  Neurological:     Mental Status: She is alert  and oriented to person, place, and time.         Latest Ref Rng & Units 07/03/2022    1:18 PM  CMP  Glucose 70 - 99 mg/dL 92   BUN 8 - 23 mg/dL 46   Creatinine 0.44 - 1.00 mg/dL 1.57   Sodium 135 - 145 mmol/L 139   Potassium 3.5 - 5.1 mmol/L 4.2   Chloride 98 - 111 mmol/L 104   CO2 22 - 32 mmol/L 26   Calcium 8.9 - 10.3 mg/dL 9.2       Latest Ref Rng & Units 06/13/2022    6:27 AM  CBC  WBC 4.0 - 10.5 K/uL 8.4   Hemoglobin 12.0 - 15.0 g/dL 11.7   Hematocrit 36.0 - 46.0 % 37.4   Platelets 150 - 400 K/uL 334     No images are attached to the encounter.  NM PET Image Initial (PI) Skull Base To Thigh  Result Date: 07/18/2022 CLINICAL DATA:  Initial treatment strategy for breast cancer. EXAM: NUCLEAR MEDICINE PET SKULL BASE TO THIGH TECHNIQUE: 14.0 mCi F-18 FDG was injected intravenously. Full-ring PET imaging was performed from the skull base to thigh after the radiotracer. CT data was obtained and used for attenuation correction and anatomic localization. Fasting blood glucose: 110 mg/dl COMPARISON:  MR abdomen and pelvis 06/16/2022, CT chest 06/13/2022, CT abdomen pelvis 05/28/2022. FINDINGS: Mediastinal blood pool activity: SUV max 2.4 Liver activity: SUV max NA NECK: Misregistration artifact.  No abnormal hypermetabolism. Incidental CT findings: Lymph nodes overlying the sternocleidomastoid muscles measure up to 1.3 cm on the left (2/42). CHEST: Hypermetabolic brown fat supraclavicular fossae. No hypermetabolic lymph nodes. No hypermetabolic pulmonary nodules. Mild vague hypermetabolism deep to the skin surface in the right breast. Incidental CT findings: Thyroid nodules measure up to approximately 5.0 cm on the left, with mass effect on the esophagus. Atherosclerotic calcification of the aorta, aortic valve and coronary arteries. Enlarged pulmonic trunk and heart. No pericardial or pleural effusion. ABDOMEN/PELVIS: No abnormal hypermetabolism in the liver, adrenal glands, spleen or  pancreas. No hypermetabolic lymph nodes. Previously seen and described nodule in the right iliac fossa is seen slightly more posterior medially within the small bowel mesentery, measuring 1.8 x 2.1 cm (2/175). No associated hypermetabolism. Incidental CT findings: Subcentimeter low-attenuation lesion in the liver, too small to characterize. Visualized portions of the liver, gallbladder and kidneys are unremarkable. Low and high density lesions in the right kidney, better evaluated on recent MR abdomen. Spleen, pancreas, stomach and bowel are grossly unremarkable. Fibroid uterus. SKELETON: No abnormal osseous hypermetabolism. Incidental CT findings: Degenerative changes in the spine. IMPRESSION: 1. Small bowel mesenteric nodule in the pelvis does not show abnormal hypermetabolism. Recommend continued attention on follow-up. 2. No evidence of metastatic breast cancer. 3. Right renal and pancreatic lesions, better evaluated on recent MR abdomen 06/16/2022. Please refer to that report. 4. Thyroid nodules measure up to approximately 5.0 cm on the left. Recommend thyroid ultrasound. (Ref: J Am Coll Radiol. 2015 Feb;12(2): 143-50). 5. Aortic atherosclerosis (ICD10-I70.0). Coronary artery calcification. 6. Enlarged pulmonic trunk, indicative of pulmonary arterial hypertension. Electronically Signed   By: Lorin Picket M.D.   On: 07/18/2022 13:39  Assessment and plan- Patient is a 76 y.o. female who is here to discuss PET scan results and further management  With regards to her breast cancer diagnosis patient is currently on Arimidex which she will continue.  Her recent PET scan did not show any evidence of metastatic disease as far as her breast cancer is concerned.  PET scan shows thyroid nodule measuring up to 5 cm for which I will obtain a thyroid ultrasound.  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  Given her overall comorbidities I would  think that this would be monitored conservatively but I will refer her to urology nevertheless as per her wishes.  Patient did not have any significant hypermetabolism in the area of the right iliac fossa.  This does not require any monitoring  I will see her back in 6 months    Visit Diagnosis 1. Renal mass   2. Multiple thyroid nodules      Dr. Randa Evens, MD, MPH [facility] [phone] 07/21/2022 5:05 PM

## 2022-07-22 ENCOUNTER — Encounter: Payer: Medicare Other | Admitting: Physician Assistant

## 2022-07-22 DIAGNOSIS — I87332 Chronic venous hypertension (idiopathic) with ulcer and inflammation of left lower extremity: Secondary | ICD-10-CM | POA: Diagnosis not present

## 2022-07-22 NOTE — Progress Notes (Signed)
MEKAYLAH, KLICH ([id number]) [id number] Page 1 of 4 Visit Report for 07/22/2022 Chief Complaint Document Details Patient Name: Date of Service: [name] 07/22/2022 2:00 PM Medical Record Number: [id number] Patient Account Number: [id number] Date of Birth/Sex: Treating RN: 06-20-1946 (76 y.o. [name] Primary Care Provider: [name] Other Clinician: Referring Provider: Treating Provider/Extender: Mayer Masker, Ngwe Weeks in Treatment: 84 Information Obtained from: Patient Chief Complaint Bilateral LE Ulcers Electronic Signature(s) Signed: 07/22/2022 2:02:57 PM By: Worthy Keeler PA-C Entered By: Worthy Keeler on 07/22/2022 14:02:57 -------------------------------------------------------------------------------- Physician Orders Details Patient Name: Date of Service: [name] 07/22/2022 2:00 PM Medical Record Number: [id number] Patient Account Number: [id number] Date of Birth/Sex: Treating RN: 1945/10/07 (76 y.o. 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 Nurse Visit as needed Bathing/ Shower/ Hygiene May shower with wound dressing protected with water repellent cover or cast protector. No tub bath. YOCELYN, BROCIOUS ([id number]) [id number] Page 2 of 4 Anesthetic (Use 'Patient Medications' Section for Anesthetic Order Entry) Lidocaine applied to wound bed Edema Control - Lymphedema / Segmental  Compressive Device / Other Bilateral Lower Extremities Optional: One layer of unna paste to top of compression wrap (to act as an anchor). Elevate, Exercise Daily and A void Standing for Long Periods of Time. Elevate legs to the level of the heart and pump ankles as often as possible Elevate leg(s) parallel to the floor when sitting. Compression Pump: Use compression pump on left lower extremity for 60 minutes, twice daily. - You may pump over wraps Compression Pump: Use compression pump on right lower extremity for 60 minutes, twice daily. - You may pump over wraps Wound Treatment Wound #4 - Lower Leg Wound Laterality: Right, Lateral Cleanser: Soap and Water 1 x Per Week/30 Days Discharge Instructions: Gently cleanse wound with antibacterial soap, rinse and pat dry prior to dressing wounds Cleanser: Wound Cleanser 1 x Per Week/30 Days Discharge Instructions: Wash your hands with soap and water. Remove old dressing, discard into plastic bag and place into trash. Cleanse the wound with Wound Cleanser prior to applying a clean dressing using gauze sponges, not tissues or cotton balls. Do not scrub or use excessive force. Pat dry using gauze sponges, not tissue or cotton balls. Prim Dressing: Silvercel Small 2x2 (in/in) 1 x Per Week/30 Days ary Discharge Instructions: Apply Silvercel Small 2x2 (in/in) as instructed Secondary Dressing: ABD Pad 5x9 (in/in) 1 x Per Week/30 Days Discharge Instructions: Cover with ABD pad Compression Wrap: 3-LAYER WRAP - Profore Lite LF 3 Multilayer Compression Bandaging System 1 x Per Week/30 Days Discharge Instructions: Apply 3 multi-layer wrap as prescribed. Wound #6 - Lower Leg Wound Laterality: Left, Medial Cleanser: Soap and Water 1 x Per Week/30 Days Discharge Instructions: Gently cleanse wound with antibacterial soap, rinse and pat dry prior to dressing wounds Cleanser: Wound Cleanser 1 x Per Week/30 Days Discharge Instructions: Wash your hands with soap  and water. Remove old dressing, discard into plastic bag and place into trash. Cleanse the wound with Wound Cleanser prior to applying a clean dressing using gauze sponges, not tissues or cotton balls. Do  not scrub or use excessive force. Pat dry using gauze sponges, not tissue or cotton balls. Secondary Dressing: ABD Pad 5x9 (in/in) 1 x Per Week/30 Days Discharge Instructions: Cover with ABD pad Compression Wrap: 3-LAYER WRAP - Profore Lite LF 3 Multilayer Compression Bandaging System 1 x Per Week/30 Days Discharge Instructions: Apply 3 multi-layer wrap as prescribed. Electronic Signature(s) Signed: 07/22/2022 3:24:36 PM By: Carlene Coria RN Entered By: Carlene Coria on 07/22/2022 15:24:35 -------------------------------------------------------------------------------- Problem List Details Patient Name: Date of Service: [name] 07/22/2022 2:00 PM Medical Record Number: [id number] Patient Account Number: [id number] Date of Birth/Sex: Treating RN: September 12, 1945 (76 y.o. [name] Primary Care Provider: [name] Other Clinician: Referring Provider: Treating Provider/Extender: Mayer Masker, Ngwe Weeks in Treatment: [address] [place], [name] ([id number]) [id number] Page 3 of 4 ICD-10 Encounter Code Description Active Date MDM Diagnosis I89.0 Lymphedema, not elsewhere classified 01/16/2021 No Yes I87.332 Chronic venous hypertension (idiopathic) with ulcer and inflammation of left 01/16/2021 No Yes lower extremity L97.812 Non-pressure chronic ulcer of other part of right lower leg with fat layer 01/07/2022 No Yes exposed Inactive Problems ICD-10 Code Description Active Date Inactive Date L97.822 Non-pressure chronic ulcer of other part of left lower leg with fat layer exposed 01/16/2021 01/16/2021 Resolved Problems Electronic Signature(s) Signed: 07/22/2022 2:02:54 PM By: Worthy Keeler PA-C Entered By: Worthy Keeler on  07/22/2022 14:02:53 -------------------------------------------------------------------------------- SuperBill Details Patient Name: Date of Service: [name] 07/22/2022 Medical Record Number: [id number] Patient Account Number: [id number] Date of Birth/Sex: Treating RN: [birth year] (76 y.o. 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 Modifier: Quantity: 1 Electronic Signature(s) Signed: 07/22/2022 3:24:52 PM By: Carlene Coria RN Wynetta Emery, Myria ([id number]) [id number] Page 4 of 4 Entered By: Carlene Coria on 07/22/2022 15:24:52

## 2022-07-22 NOTE — Progress Notes (Addendum)
TAKARA, SERMONS ([id number]) [id number] Page 1 of 10 Visit Report for 07/22/2022 Arrival Information Details Patient Name: Date of Service: [name] TRICE 07/22/2022 2:00 PM Medical Record Number: [id number] Patient Account Number: [id number] Date of Birth/Sex: Treating RN: March 03, 1946 (76 y.o. [name] Primary Care Benn Tarver: [name] Other Clinician: Referring Khyan Oats: Treating Guido Comp/Extender: Mayer Masker, Ngwe Weeks in Treatment: 65 Visit Information History Since Last Visit All ordered tests and consults were completed: No Patient Arrived: Wheel Chair Added or deleted any medications: No Arrival Time: 14:10 Any new allergies or adverse reactions: No Accompanied By: self Had a fall or experienced change in No Transfer Assistance: None activities of daily living that may affect Patient Identification Verified: Yes risk of falls: Secondary Verification Process Completed: Yes Signs or symptoms of abuse/neglect since last visito No Patient Requires Transmission-Based Precautions: No Hospitalized since last visit: No Patient Has Alerts: Yes Implantable device outside of the clinic excluding No Patient Alerts: NOT DIABETIC cellular tissue based products placed in the center since last visit: Has Dressing in Place as Prescribed: Yes Has Compression in Place as Prescribed: Yes Pain Present Now: No Electronic Signature(s) Signed: 07/25/2022 1:14:49 PM By: Carlene Coria RN Entered By: Carlene Coria on 07/22/2022 14:15:54 -------------------------------------------------------------------------------- Clinic Level of Care Assessment Details Patient Name: Date of Service: [name] TRICE 07/22/2022 2:00 PM Medical Record Number: [id number] Patient Account Number: [id number] Date of Birth/Sex: Treating RN: Sep 06, 1946 (76 y.o. [name] Primary Care Torry Istre: [name] Other Clinician: Referring Marvelous Bouwens: Treating  Luanne Krzyzanowski/Extender: Mayer Masker, Ngwe Weeks in Treatment: 33 Clinic Level of Care Assessment Items TOOL 1 Quantity Score '[]'$  - 0 Use when EandM and Procedure is performed on INITIAL visit ASSESSMENTS - Nursing Assessment / Reassessment '[]'$  - 0 General Physical Exam (combine w/ comprehensive assessment (listed just below) when performed on new pt. [address], [name] ([id number]) [id number] Page 2 of 10 '[]'$  - 0 Comprehensive Assessment (HX, ROS, Risk Assessments, Wounds Hx, etc.) ASSESSMENTS - Wound and Skin Assessment / Reassessment '[]'$  - 0 Dermatologic / Skin Assessment (not related to wound area) ASSESSMENTS - Ostomy and/or Continence Assessment and Care '[]'$  - 0 Incontinence Assessment and Management '[]'$  - 0 Ostomy Care Assessment and Management (repouching, etc.) PROCESS - Coordination of Care '[]'$  - 0 Simple Patient / Family Education for ongoing care '[]'$  - 0 Complex (extensive) Patient / Family Education for ongoing care '[]'$  - 0 Staff obtains Programmer, systems, Records, T Results / Process Orders est '[]'$  - 0 Staff telephones HHA, Nursing Homes / Clarify orders / etc '[]'$  - 0 Routine Transfer to another Facility (non-emergent condition) '[]'$  - 0 Routine Hospital Admission (non-emergent condition) '[]'$  - 0 New Admissions / Biomedical engineer / Ordering NPWT Apligraf, etc. , '[]'$  - 0 Emergency Hospital Admission (emergent condition) PROCESS - Special Needs '[]'$  - 0 Pediatric / Minor Patient Management '[]'$  - 0 Isolation Patient Management '[]'$  - 0 Hearing / Language / Visual special needs '[]'$  - 0 Assessment of Community assistance (transportation, D/C planning, etc.) '[]'$  - 0 Additional assistance / Altered mentation '[]'$  - 0 Support Surface(s) Assessment (bed, cushion, seat, etc.) INTERVENTIONS - Miscellaneous '[]'$  - 0 External ear exam '[]'$  - 0 Patient Transfer (multiple staff / Civil Service fast streamer / Similar devices) '[]'$  - 0 Simple Staple / Suture removal (25 or  less) '[]'$  - 0 Complex Staple / Suture removal (26 or more) '[]'$  - 0 Hypo/Hyperglycemic Management (do not check if billed separately) '[]'$  - 0 Ankle / Brachial Index (  ABI) - do not check if billed separately Has the patient been seen at the hospital within the last three years: Yes Total Score: 0 Level Of Care: ____ Electronic Signature(s) Signed: 07/25/2022 1:14:49 PM By: Carlene Coria RN Entered By: Carlene Coria on 07/22/2022 15:24:42 -------------------------------------------------------------------------------- Compression Therapy Details Patient Name: Date of Service: [name] TRICE 07/22/2022 2:00 PM Medical Record Number: [id number] Patient Account Number: [id number] Date of Birth/Sex: Treating RN: Apr 04, 1946 (76 y.o. [name] Primary Care Emorie Mcfate: [name] Other Clinician: SHANNIN, NAAB ([id number]) [id number] Page 3 of 10 Referring Izaac Reisig: Treating Dreana Britz/Extender: Mayer Masker, Ngwe Weeks in Treatment: 78 Compression Therapy Performed for Wound Assessment: Wound #4 Right,Lateral Lower Leg Performed By: Clinician Carlene Coria, RN Compression Type: Three Layer Post Procedure Diagnosis Same as Pre-procedure Electronic Signature(s) Signed: 07/22/2022 3:23:27 PM By: Carlene Coria RN Entered By: Carlene Coria on 07/22/2022 15:23:27 -------------------------------------------------------------------------------- Compression Therapy Details Patient Name: Date of Service: [name] TRICE 07/22/2022 2:00 PM Medical Record Number: [id number] Patient Account Number: [id number] Date of Birth/Sex: Treating RN: March 26, 1946 (76 y.o. [name] Primary Care Jlynn Ly: [name] Other Clinician: Referring Marleena Shubert: Treating Denika Krone/Extender: Mayer Masker, Ngwe Weeks in Treatment: 49 Compression Therapy Performed for Wound Assessment: Wound #6 Left,Medial Lower Leg Performed By: Clinician Carlene Coria,  RN Compression Type: Three Layer Post Procedure Diagnosis Same as Pre-procedure Electronic Signature(s) Signed: 07/22/2022 3:23:43 PM By: Carlene Coria RN Entered By: Carlene Coria on 07/22/2022 15:23:42 -------------------------------------------------------------------------------- Encounter Discharge Information Details Patient Name: Date of Service: [name] TRICE 07/22/2022 2:00 PM Medical Record Number: [id number] Patient Account Number: [id number] Date of Birth/Sex: Treating RN: 05-12-1946 (76 y.o. [name] Primary Care Serge Main: [name] Other Clinician: Referring Evanny Ellerbe: Treating Jamison Soward/Extender: Mayer Masker, Ngwe Weeks in Treatment: 55 Encounter Discharge Information Items Discharge Condition: Stable Ambulatory Status: Wheelchair Discharge Destination: [place], [place] ([id number]) [id number] Page 4 of 10 Transportation: Private Auto Accompanied By: self Schedule Follow-up Appointment: Yes Clinical Summary of Care: Electronic Signature(s) Signed: 07/22/2022 3:29:09 PM By: Carlene Coria RN Entered By: Carlene Coria on 07/22/2022 15:29:08 -------------------------------------------------------------------------------- Lower Extremity Assessment Details Patient Name: Date of Service: [name] TRICE 07/22/2022 2:00 PM Medical Record Number: [id number] Patient Account Number: [id number] Date of Birth/Sex: Treating RN: 08-May-1946 (76 y.o. [name] Primary Care Yaileen Hofferber: [name] Other Clinician: Referring Eris Hannan: Treating Sudeep Scheibel/Extender: Mayer Masker, Ngwe Weeks in Treatment: 78 Edema Assessment Assessed: [Left: No] [Right: No] Edema: [Left: Yes] [Right: Yes] Calf Left: Right: Point of Measurement: From Medial Instep 42 cm 45 cm Ankle Left: Right: Point of Measurement: From Medial Instep 24 cm 24 cm Vascular Assessment Pulses: Dorsalis Pedis Palpable: [Left:Yes]  [Right:Yes] Electronic Signature(s) Signed: 07/25/2022 1:14:49 PM By: Carlene Coria RN Entered By: Carlene Coria on 07/22/2022 14:29:30 -------------------------------------------------------------------------------- Multi Wound Chart Details Patient Name: Date of Service: [name] TRICE 07/22/2022 2:00 PM Medical Record Number: [id number] Patient Account Number: [id number] RAJANAE, MANTIA ([id number]) [id number] Page 5 of 10 Date of Birth/Sex: Treating RN: [birth year] (76 y.o. [name] Primary Care Alliana Mcauliff: [name] Other Clinician: Referring Andreia Gandolfi: Treating Deandrea Rion/Extender: Mayer Masker, Ngwe Weeks in Treatment: 21 Vital Signs Height(in): 69 Pulse(bpm): 38 Weight(lbs): 244 Blood Pressure(mmHg): 145/67 Body Mass Index(BMI): 36 Temperature(F): 97.6 Respiratory Rate(breaths/min): 18 [4:Photos:] [N/A:N/A] Right, Lateral Lower Leg Left, Medial Lower Leg N/A Wound Location: Gradually Appeared Blister N/A Wounding Event: Venous Leg Ulcer Lymphedema N/A Primary Etiology: Cataracts, Lymphedema, Sleep Cataracts, Lymphedema, Sleep N/A Comorbid History:  Apnea, Congestive Heart Failure, Apnea, Congestive Heart Failure, Hypertension, Osteoarthritis, Hypertension, Osteoarthritis, Neuropathy, Received Chemotherapy, Neuropathy, Received Chemotherapy, Received Radiation Received Radiation 10/27/2021 07/09/2022 N/A Date Acquired: 79 0 N/A Weeks of Treatment: Open Open N/A Wound Status: No No N/A Wound Recurrence: 5x1.2x0.1 10x11x0.1 N/A Measurements L x W x D (cm) 4.712 86.394 N/A A (cm) : rea 0.471 8.639 N/A Volume (cm) : -300.00% N/A N/A % Reduction in Area: -299.20% N/A N/A % Reduction in Volume: Full Thickness Without Exposed Full Thickness Without Exposed N/A Classification: Support Structures Support Structures Medium Medium N/A Exudate Amount: Serosanguineous Serosanguineous N/A Exudate Type: red, brown red,  brown N/A Exudate Color: Medium (34-66%) None Present (0%) N/A Granulation Amount: Pink N/A N/A Granulation Quality: Medium (34-66%) None Present (0%) N/A Necrotic Amount: Fat Layer (Subcutaneous Tissue): Yes Fascia: No N/A Exposed Structures: Fascia: No Fat Layer (Subcutaneous Tissue): No Tendon: No Tendon: No Muscle: No Muscle: No Joint: No Joint: No Bone: No Bone: No None None N/A Epithelialization: Treatment Notes Electronic Signature(s) Signed: 07/25/2022 1:14:49 PM By: Carlene Coria RN Entered By: Carlene Coria on 07/22/2022 14:30:00 Multi-Disciplinary Care Plan Details -------------------------------------------------------------------------------- [name] ([id number]) [id number] Page 6 of 10 Patient Name: Date of Service: [name] TRICE 07/22/2022 2:00 PM Medical Record Number: [id number] Patient Account Number: [id number] Date of Birth/Sex: Treating RN: 06-07-1946 (76 y.o. [name] Primary Care Kamayah Pillay: [name] Other Clinician: Referring Kendry Pfarr: Treating Andra Matsuo/Extender: Mayer Masker, Ngwe Weeks in Treatment: 1 Active Inactive Electronic Signature(s) Signed: 07/25/2022 1:14:49 PM By: Carlene Coria RN Entered By: Carlene Coria on 07/22/2022 14:29:51 -------------------------------------------------------------------------------- Pain Assessment Details Patient Name: Date of Service: [name] TRICE 07/22/2022 2:00 PM Medical Record Number: [id number] Patient Account Number: [id number] Date of Birth/Sex: Treating RN: 1946/04/14 (76 y.o. [name] Primary Care Mckale Haffey: [name] Other Clinician: Referring Zykiria Bruening: Treating Delano Frate/Extender: Mayer Masker, Ngwe Weeks in Treatment: 48 Active Problems Location of Pain Severity and Description of Pain Patient Has Paino No Site Locations Pain Management and Medication Current Pain Management: Electronic Signature(s) Signed:  07/25/2022 1:14:49 PM By: Carlene Coria RN Entered By: Carlene Coria on 07/22/2022 14:16:53 [name] ([id number]) [id number] Page 7 of 10 -------------------------------------------------------------------------------- Patient/Caregiver Education Details Patient Name: Date of Service: [name] TRICE 11/14/2023andnbsp2:00 PM Medical Record Number: [id number] Patient Account Number: [id number] Date of Birth/Gender: Treating RN: 24-Feb-1946 (76 y.o. [name] Primary Care Physician: [name] Other Clinician: Referring Physician: Treating Physician/Extender: Alto [place] Weeks in Treatment: 53 Education Assessment Education Provided To: Patient Education Topics Provided Wound/Skin Impairment: Methods: Explain/Verbal Responses: State content correctly Electronic Signature(s) Signed: 07/25/2022 1:14:49 PM By: Carlene Coria RN Entered By: Carlene Coria on 07/22/2022 15:25:03 -------------------------------------------------------------------------------- Wound Assessment Details Patient Name: Date of Service: [name] TRICE 07/22/2022 2:00 PM Medical Record Number: [id number] Patient Account Number: [id number] Date of Birth/Sex: Treating RN: 10/04/1945 (76 y.o. [name] Primary Care Marion Seese: [name] Other Clinician: Referring Dawayne Ohair: Treating Apollo Timothy/Extender: Mayer Masker, Ngwe Weeks in Treatment: 71 Wound Status Wound Number: 4 Primary Venous Leg Ulcer Etiology: Wound Location: Right, Lateral Lower Leg Wound Open Wounding Event: Gradually Appeared Status: Date Acquired: 10/27/2021 Comorbid Cataracts, Lymphedema, Sleep Apnea, Congestive Heart Failure, Weeks Of Treatment: 38 History: Hypertension, Osteoarthritis, Neuropathy, Received Chemotherapy, Clustered Wound: No Received Radiation Photos [place], [name] ([id number]) [id number] Page 8 of 10 Wound  Measurements Length: (cm) 5 Width: (cm) 1.2 Depth: (cm) 0.1 Area: (cm) 4.712 Volume: (cm) 0.471 % Reduction in Area: -300% %  Reduction in Volume: -299.2% Epithelialization: None Tunneling: No Undermining: No Wound Description Classification: Full Thickness Without Exposed Support Structures Exudate Amount: Medium Exudate Type: Serosanguineous Exudate Color: red, brown Foul Odor After Cleansing: No Slough/Fibrino Yes Wound Bed Granulation Amount: Medium (34-66%) Exposed Structure Granulation Quality: Pink Fascia Exposed: No Necrotic Amount: Medium (34-66%) Fat Layer (Subcutaneous Tissue) Exposed: Yes Necrotic Quality: Adherent Slough Tendon Exposed: No Muscle Exposed: No Joint Exposed: No Bone Exposed: No Treatment Notes Wound #4 (Lower Leg) Wound Laterality: Right, Lateral Cleanser Soap and Water Discharge Instruction: Gently cleanse wound with antibacterial soap, rinse and pat dry prior to dressing wounds Wound Cleanser Discharge Instruction: Wash your hands with soap and water. Remove old dressing, discard into plastic bag and place into trash. Cleanse the wound with Wound Cleanser prior to applying a clean dressing using gauze sponges, not tissues or cotton balls. Do not scrub or use excessive force. Pat dry using gauze sponges, not tissue or cotton balls. Peri-Wound Care Topical Primary Dressing Silvercel Small 2x2 (in/in) Discharge Instruction: Apply Silvercel Small 2x2 (in/in) as instructed Secondary Dressing ABD Pad 5x9 (in/in) Discharge Instruction: Cover with ABD pad Secured With Compression Wrap 3-LAYER WRAP - Profore Lite LF 3 Multilayer Compression Bandaging System Discharge Instruction: Apply 3 multi-layer wrap as prescribed. Compression Stockings Add-Ons Electronic Signature(s) Signed: 07/25/2022 1:14:49 PM By: Carlene Coria RN Entered By: Carlene Coria on 07/22/2022 14:26:38 [name] ([id number]) [id number] Page  9 of 10 -------------------------------------------------------------------------------- Wound Assessment Details Patient Name: Date of Service: [name] TRICE 07/22/2022 2:00 PM Medical Record Number: [id number] Patient Account Number: [id number] Date of Birth/Sex: Treating RN: Sep 16, 1945 (76 y.o. [name] Primary Care Rachelann Enloe: [name] Other Clinician: Referring Divit Stipp: Treating Naudia Crosley/Extender: Mayer Masker, Ngwe Weeks in Treatment: 31 Wound Status Wound Number: 6 Primary Lymphedema Etiology: Wound Location: Left, Medial Lower Leg Wound Open Wounding Event: Blister Status: Date Acquired: 07/09/2022 Comorbid Cataracts, Lymphedema, Sleep Apnea, Congestive Heart Failure, Weeks Of Treatment: 0 History: Hypertension, Osteoarthritis, Neuropathy, Received Chemotherapy, Clustered Wound: No Received Radiation Photos Wound Measurements Length: (cm) 10 Width: (cm) 11 Depth: (cm) 0.1 Area: (cm) 86.394 Volume: (cm) 8.639 % Reduction in Area: % Reduction in Volume: Epithelialization: None Tunneling: No Undermining: No Wound Description Classification: Full Thickness Without Exposed Support Structures Exudate Amount: Medium Exudate Type: Serosanguineous Exudate Color: red, brown Foul Odor After Cleansing: No Slough/Fibrino No Wound Bed Granulation Amount: None Present (0%) Exposed Structure Necrotic Amount: None Present (0%) Fascia Exposed: No Fat Layer (Subcutaneous Tissue) Exposed: No Tendon Exposed: No Muscle Exposed: No Joint Exposed: No Bone Exposed: No Treatment Notes Wound #6 (Lower Leg) Wound Laterality: Left, Medial Cleanser Soap and Water Discharge Instruction: Gently cleanse wound with antibacterial soap, rinse and pat dry prior to dressing wounds Wound Cleanser [place], Zaina ([id number]) [id number] Page 10 of 10 Discharge Instruction: [facility] your hands with soap and water. Remove old dressing, discard  into plastic bag and place into trash. Cleanse the wound with Wound Cleanser prior to applying a clean dressing using gauze sponges, not tissues or cotton balls. Do not scrub or use excessive force. Pat dry using gauze sponges, not tissue or cotton balls. Peri-Wound Care Topical Primary Dressing Secondary Dressing ABD Pad 5x9 (in/in) Discharge Instruction: Cover with ABD pad Secured With Compression Wrap 3-LAYER WRAP - Profore Lite LF 3 Multilayer Compression Bandaging System Discharge Instruction: Apply 3 multi-layer wrap as prescribed. Compression Stockings Add-Ons Electronic Signature(s) Signed: 07/25/2022 1:14:49 PM By: Carlene Coria RN Entered By: Carlene Coria on 07/22/2022 14:28:18 --------------------------------------------------------------------------------  Vitals Details Patient Name: Date of Service: [name] TRICE 07/22/2022 2:00 PM Medical Record Number: [id number] Patient Account Number: [id number] Date of Birth/Sex: Treating RN: [birth year] (76 y.o. [name] Primary Care Avery Eustice: [name] Other Clinician: Referring Maudine Kluesner: Treating Remer Couse/Extender: Mayer Masker, Ngwe Weeks in Treatment: 35 Vital Signs Time Taken: 14:16 Temperature (F): 97.6 Height (in): 69 Pulse (bpm): 58 Weight (lbs): 244 Respiratory Rate (breaths/min): 18 Body Mass Index (BMI): 36 Blood Pressure (mmHg): 145/67 Reference Range: 80 - 120 mg / dl Electronic Signature(s) Signed: 07/25/2022 1:14:49 PM By: Carlene Coria RN Entered By: Carlene Coria on 07/22/2022 14:16:44

## 2022-07-24 ENCOUNTER — Ambulatory Visit: Payer: Medicare Other | Admitting: Family

## 2022-07-24 NOTE — Progress Notes (Deleted)
Patient ID: [name], female    DOB: 1946/07/30, 76 y.o.   MRN: [id number]  HPI  [name] is a 76 y/o female with a history of breast cancer, CKD, HTN, GERD, sleep apnea, previous tobacco use and chronic heart failure.   Echo report from 03/04/22 reviewed and showed an EF of 50-55% along with mild LVH/ LAE and mild MR.   Admitted 06/12/22 due to acute on chronic CHF with volume overload. Initially given IV lasix with transition to oral diuretics. HTN emergency and meds adjusted. Elevated troponin thought to be due to demand ischemia. Weaned off oxygen to room air. Discharged after 3 days.   She presents today for a follow-up visit with a chief complaint of   Goes to the wound center for her lymphedema and has her legs wrapped 1-2 times/ week.   Past Medical History:  Diagnosis Date   Abnormality of gait    Acute respiratory failure with hypoxia ([place]) 06/13/2022   Breast cancer ([place]) 02/05/2005   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    Breast cancer ([place]) 2019   right breast   CHF (congestive heart failure) ([place])    March 28, 2017 echo: EF 50-55%; 1) echo 07/2011: EF 20-25%, mild concentric hypertrophy, diffuse HK, regional WMA cannot be excluded, grade 1 DD, mitral valve with mild regurg, LA mildly dilated). 2) EF 40-45%, mild AS,                     Chronic kidney disease    Complication of anesthesia 2006   pt states "hard to wake up" after L breast surgery    Edema    GERD (gastroesophageal reflux disease)    Headache    Heart murmur    Hypertension    Hypokinesis    global, EF 35-45 % from Echo.   Malignant neoplasm of breast (female), unspecified site 06/14/2018   RIGHT T1b, N0; ER/PR positive, HER-2 negative.   Neuropathy    Obesity    Pain in joint, site unspecified    Personal history of chemotherapy 2006   left breast   Personal history of radiation therapy 2019   right  breast   Personal history of radiation therapy 2006   left breast   Pneumonia    Sleep apnea    Tinea pedis    Unspecified hearing loss    bilateral   Past Surgical History:  Procedure Laterality Date   BREAST BIOPSY Right 2019   11 oc-INVASIVE MAMMARY CARCINOMA, NO SPECIAL TYPE   BREAST BIOPSY Right 2019   1030 oc- neg   BREAST LUMPECTOMY  2006   left breast    CATARACT EXTRACTION     left eye   HERNIA REPAIR  05/25/2006   Incarcerated omentum in ventral hernia, Ventralight mesh, combined lap/ open with omental resection.    PARTIAL MASTECTOMY WITH NEEDLE LOCALIZATION Right 06/14/2018   Procedure: PARTIAL MASTECTOMY WITH NEEDLE LOCALIZATION;  Surgeon: Robert Bellow, MD;  Location: [facility] ORS;  Service: General;  Laterality: Right;   PORT-A-CATH REMOVAL  05/25/2006   SENTINEL NODE BIOPSY Right 06/14/2018   Procedure: SENTINEL NODE BIOPSY;  Surgeon: Robert Bellow, MD;  Location: [facility] OR[facility];  Service: General;  Laterality: Right;   TUBAL LIGATION     Family History  Problem Relation Age of Onset   Hypertension Mother        deceased 59   Cataracts  Mother    Breast cancer Other 52       maternal half-sister; deceased 36   Coronary artery disease Other    Social History   Tobacco Use   Smoking status: Former    Packs/day: 0.50    Years: 30.00    Total pack years: 15.00    Types: Cigarettes    Quit date: 07/14/2000    Years since quitting: 22.0   Smokeless tobacco: Never  Substance Use Topics   Alcohol use: No   No Known Allergies  Review of Systems  Constitutional:  Positive for appetite change (decreased) and fatigue.  HENT:  Negative for congestion, postnasal drip and sore throat.   Eyes:  Positive for visual disturbance (cataracts).  Respiratory:  Positive for shortness of breath. Negative for cough and wheezing.   Cardiovascular:  Positive for chest pain (at times) and leg swelling. Negative for palpitations.  Gastrointestinal:  Positive for abdominal  distention (at times). Negative for abdominal pain.  Endocrine: Negative.   Genitourinary: Negative.   Musculoskeletal:  Negative for back pain and neck pain.  Skin:  Positive for wound (right leg).  Allergic/Immunologic: Negative.   Neurological:  Positive for dizziness and light-headedness.  Hematological:  Negative for adenopathy. Does not bruise/bleed easily.  Psychiatric/Behavioral:  Positive for sleep disturbance (chronic difficulty sleeping; sleeping on 1 pillow). Negative for dysphoric mood. The patient is nervous/anxious.     Physical Exam Vitals and nursing note reviewed. Exam conducted with a chaperone present (granddaughter).  Constitutional:      Appearance: Normal appearance.  HENT:     Head: Normocephalic and atraumatic.  Cardiovascular:     Rate and Rhythm: Normal rate and regular rhythm.  Pulmonary:     Effort: Pulmonary effort is normal. No respiratory distress.     Breath sounds: No wheezing or rales.  Abdominal:     General: There is no distension.     Palpations: Abdomen is soft.     Tenderness: There is no abdominal tenderness.  Musculoskeletal:        General: No tenderness.     Cervical back: Normal range of motion and neck supple.     Right lower leg: Edema (wrapped in [employer]) present.     Left lower leg: Edema (wrapped in [employer]) present.  Skin:    General: Skin is warm and dry.  Neurological:     General: No focal deficit present.     Mental Status: She is alert and oriented to person, place, and time.  Psychiatric:        Mood and Affect: Mood is anxious. Mood is not depressed.    Assessment & Plan:  1: Chronic heart failure with preserved ejection fraction with structural changes (LVH/LAE)- - NYHA class III - euvolemic today - scales provided today and instructed to weigh daily, write the weight down and call for an overnight weight gain of > 2 pounds or a weekly weight gain of > 5 pounds - weight 247 pounds from last visit here 1 month  ago - not adding salt and granddaughter says that she doesn't cook with salt either; says that patient does like to eat junk food that other family members bring in; also also likes to eat pork but is aware that this can high in sodium  - unclear of fluid intake - on GDMT of losartan - discussed changing losartan to entresto and adding SGLT2 but she isn't completely sure of what meds she takes - BNP 06/13/22  was 367.7  2: HTN with CKD- - BP  - saw PCP (Aycock) - BMP 07/03/22 reviewed and showed sodium 139, potassium 4.2, creatinine 1.57 and GFR 34 - recheck labs today  3: OSA- - last sleep study was done 5 years ago and she's needing new supplies so that she can start wearing it  4: Breast cancer- - saw oncology Janese Banks) 07/21/22 - currently taking arimidex  5: Lymphedema- - stage 2 - encouraged her to elevate her legs when sitting for long periods of time - legs are currently wrapped in [employer] boots - went to wound center 07/22/22 - limited in her ability to exercise - consider compression boots once she no longer needs [employer] boots   Patient did not bring her medications nor a list. Each medication was verbally reviewed with the patient and she was encouraged to bring the bottles to every visit to confirm accuracy of list.

## 2022-07-25 ENCOUNTER — Ambulatory Visit
Admission: RE | Admit: 2022-07-25 | Discharge: 2022-07-25 | Disposition: A | Discharge status: Home / Self Care | Payer: Medicare Other | Source: Ambulatory Visit | Attending: Oncology | Admitting: Oncology

## 2022-07-25 ENCOUNTER — Inpatient Hospital Stay: Payer: Medicare Other

## 2022-07-25 ENCOUNTER — Ambulatory Visit: Admission: RE | Admit: 2022-07-25 | Payer: Medicare Other | Source: Ambulatory Visit

## 2022-07-25 DIAGNOSIS — E042 Nontoxic multinodular goiter: Secondary | ICD-10-CM | POA: Diagnosis present

## 2022-07-29 ENCOUNTER — Encounter: Payer: Self-pay | Admitting: Urology

## 2022-07-29 ENCOUNTER — Ambulatory Visit: Payer: Self-pay | Admitting: Urology

## 2022-07-29 ENCOUNTER — Encounter: Payer: Medicare Other | Admitting: Physician Assistant

## 2022-07-29 DIAGNOSIS — I87332 Chronic venous hypertension (idiopathic) with ulcer and inflammation of left lower extremity: Secondary | ICD-10-CM | POA: Diagnosis not present

## 2022-07-29 NOTE — Progress Notes (Incomplete)
07/29/2022 8:29 AM   [name] [birth year] [id number]  Referring provider: Donnie Coffin, MD [place],  [place] [id number]  No chief complaint on file.   HPI:    PMH: Past Medical History:  Diagnosis Date   Abnormality of gait    Acute respiratory failure with hypoxia ([place]) 06/13/2022   Breast cancer ([place]) 02/05/2005   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    Breast cancer ([place]) 2019   right breast   CHF (congestive heart failure) ([place])    March 28, 2017 echo: EF 50-55%; 1) echo 07/2011: EF 20-25%, mild concentric hypertrophy, diffuse HK, regional WMA cannot be excluded, grade 1 DD, mitral valve with mild regurg, LA mildly dilated). 2) EF 40-45%, mild AS,                     Chronic kidney disease    Complication of anesthesia 2006   pt states "hard to wake up" after L breast surgery    Edema    GERD (gastroesophageal reflux disease)    Headache    Heart murmur    Hypertension    Hypokinesis    global, EF 35-45 % from Echo.   Malignant neoplasm of breast (female), unspecified site 06/14/2018   RIGHT T1b, N0; ER/PR positive, HER-2 negative.   Neuropathy    Obesity    Pain in joint, site unspecified    Personal history of chemotherapy 2006   left breast   Personal history of radiation therapy 2019   right breast   Personal history of radiation therapy 2006   left breast   Pneumonia    Sleep apnea    Tinea pedis    Unspecified hearing loss    bilateral    Surgical History: Past Surgical History:  Procedure Laterality Date   BREAST BIOPSY Right 2019   11 oc-INVASIVE MAMMARY CARCINOMA, NO SPECIAL TYPE   BREAST BIOPSY Right 2019   1030 oc- neg   BREAST LUMPECTOMY  2006   left breast    CATARACT EXTRACTION     left eye   HERNIA REPAIR  05/25/2006   Incarcerated omentum in ventral hernia, Ventralight mesh, combined lap/ open with omental resection.     PARTIAL MASTECTOMY WITH NEEDLE LOCALIZATION Right 06/14/2018   Procedure: PARTIAL MASTECTOMY WITH NEEDLE LOCALIZATION;  Surgeon: Robert Bellow, MD;  Location: [facility];  Service: General;  Laterality: Right;   PORT-A-CATH REMOVAL  05/25/2006   SENTINEL NODE BIOPSY Right 06/14/2018   Procedure: SENTINEL NODE BIOPSY;  Surgeon: Robert Bellow, MD;  Location: [facility];  Service: General;  Laterality: Right;   TUBAL LIGATION      Home Medications:  Allergies as of 07/29/2022   No Known Allergies      Medication List        Accurate as of July 29, 2022  8:29 AM. If you have any questions, ask your nurse or doctor.          acetaminophen-codeine 300-30 MG tablet Commonly known as: TYLENOL #3 Take 1-2 tablets by mouth every 4 (four) hours as needed for moderate pain.   anastrozole 1 MG tablet Commonly known as: ARIMIDEX TAKE 1 TABLET BY MOUTH ONCE DAILY. PTAIENT MUST MAKE AN APPOINTMENT TO SEE DR RAO   aspirin EC 81 MG tablet Take 81 mg by mouth daily.   Bladder Control Pads Ex Absorb Misc Apply 1 Dose  topically as needed.   cloNIDine 0.1 MG tablet Commonly known as: CATAPRES Take 1 tablet (0.1 mg total) by mouth 2 (two) times daily as needed (systolic BP > 517 or diastolic BP > 95).   [employer] Calcium + D3 600-20 MG-MCG Tabs Generic drug: Calcium Carb-Cholecalciferol TAKE 1 TABLET BY MOUTH TWICE A DAY   diclofenac Sodium 1 % Gel Commonly known as: VOLTAREN Apply 1 application topically as needed.   Docusate Sodium 100 MG capsule Take 100 mg by mouth 2 (two) times daily as needed.   isosorbide mononitrate 60 MG 24 hr tablet Commonly known as: Imdur Take 1 tablet (60 mg total) by mouth 2 (two) times daily.   loratadine 10 MG tablet Commonly known as: CLARITIN Take 10 mg by mouth daily.   losartan 25 MG tablet Commonly known as: COZAAR Take 1 tablet (25 mg total) by mouth daily.   methocarbamol 500 MG tablet Commonly known as: ROBAXIN Take by mouth.    MiraLax 17 GM/SCOOP powder Generic drug: polyethylene glycol powder SMARTSIG:2 Scoopful By Mouth Daily PRN   omeprazole 40 MG capsule Commonly known as: PRILOSEC Take 40 mg by mouth daily.   oxybutynin 5 MG tablet Commonly known as: DITROPAN Take 5 mg by mouth 2 (two) times daily.   potassium chloride SA 20 MEQ tablet Commonly known as: KLOR-CON M Take 1 tablet (20 mEq total) by mouth 2 (two) times daily.   Torsemide 40 MG Tabs Take 40 mg by mouth daily in the afternoon.        Allergies: No Known Allergies  Family History: Family History  Problem Relation Age of Onset   Hypertension Mother        deceased 64   Cataracts Mother    Breast cancer Other 57       maternal half-sister; deceased 27   Coronary artery disease Other     Social History:  reports that she quit smoking about 22 years ago. Her smoking use included cigarettes. She has a 15.00 pack-year smoking history. She has never used smokeless tobacco. She reports that she does not currently use drugs after having used the following drugs: Marijuana. She reports that she does not drink alcohol.   Physical Exam: There were no vitals taken for this visit.  Constitutional:  Alert and oriented, No acute distress. HEENT: [place] AT, moist mucus membranes.  Trachea midline, no masses. Cardiovascular: No clubbing, cyanosis, or edema. Respiratory: Normal respiratory effort, no increased work of breathing. GI: Abdomen is soft, nontender, nondistended, no abdominal masses GU: No CVA tenderness Skin: No rashes, bruises or suspicious lesions. Neurologic: Grossly intact, no focal deficits, moving all 4 extremities. Psychiatric: Normal mood and affect.  Laboratory Data: Lab Results  Component Value Date   WBC 8.4 06/13/2022   HGB 11.7 (L) 06/13/2022   HCT 37.4 06/13/2022   MCV 87.6 06/13/2022   PLT 334 06/13/2022    Lab Results  Component Value Date   CREATININE 1.57 (H) 07/03/2022    No results found for:  "PSA"  No results found for: "TESTOSTERONE"  Lab Results  Component Value Date   HGBA1C 5.8 (H) 03/28/2017    Urinalysis    Component Value Date/Time   COLORURINE YELLOW (A) 03/03/2022 0208   APPEARANCEUR CLEAR (A) 03/03/2022 0208   APPEARANCEUR Clear 03/22/2014 1620   LABSPEC 1.023 03/03/2022 0208   LABSPEC 1.020 03/22/2014 1620   PHURINE 5.0 03/03/2022 0208   GLUCOSEU NEGATIVE 03/03/2022 0208   GLUCOSEU Negative 03/22/2014 1620  HGBUR NEGATIVE 03/03/2022 0208   BILIRUBINUR NEGATIVE 03/03/2022 0208   BILIRUBINUR Negative 03/22/2014 1620   KETONESUR NEGATIVE 03/03/2022 0208   PROTEINUR 30 (A) 03/03/2022 0208   NITRITE NEGATIVE 03/03/2022 0208   LEUKOCYTESUR LARGE (A) 03/03/2022 0208   LEUKOCYTESUR Negative 03/22/2014 1620    Lab Results  Component Value Date   BACTERIA NONE SEEN 03/03/2022    Pertinent Imaging: *** No results found for this or any previous visit.  No results found for this or any previous visit.  No results found for this or any previous visit.  No results found for this or any previous visit.  No results found for this or any previous visit.  No valid procedures specified. No results found for this or any previous visit.  No results found for this or any previous visit.   Assessment & Plan:    There are no diagnoses linked to this encounter.  No follow-ups on file.  Hollice Espy, MD  [facility] [address] [place], [place] [address] [phone]

## 2022-07-29 NOTE — Progress Notes (Addendum)
[name], [name] ([id number]) [id number] Page 1 of 13 Visit Report for 07/29/2022 Chief Complaint Document Details Patient Name: Date of Service: [name] 07/29/2022 2:00 PM Medical Record Number: [id number] Patient Account Number: [id number] Date of Birth/Sex: Treating RN: October 24, 1945 (76 y.o. [name] Primary Care Provider: [name] Other Clinician: Referring Provider: Treating Provider/Extender: Mayer Masker, Ngwe Weeks in Treatment: 51 Information Obtained from: Patient Chief Complaint Bilateral LE Ulcers Electronic Signature(s) Signed: 07/29/2022 2:11:06 PM By: Worthy Keeler PA-C Entered By: Worthy Keeler on 07/29/2022 14:11:06 -------------------------------------------------------------------------------- HPI Details Patient Name: Date of Service: [name] 07/29/2022 2:00 PM Medical Record Number: [id number] Patient Account Number: [id number] Date of Birth/Sex: Treating RN: [birth year] (76 y.o. [name] Primary Care Provider: [name] Other Clinician: Referring Provider: Treating Provider/Extender: Mayer Masker, Ngwe Weeks in Treatment: 22 History of Present Illness HPI Description: 03/15/2020 upon evaluation today patient presents today for initial evaluation here in our clinic concerning a wound that is on the left posterior lower extremity. Unfortunately this has been giving the patient some discomfort at this point which she notes has been affected her pretty much daily. The patient does have a history of venous insufficiency, hypertension, congestive heart failure, and a history of breast cancer. Fortunately there does not appear to be evidence of active infection at this time which is great news. No fevers, chills, nausea, vomiting, or diarrhea. Wound is not extremely large but does have some slough covering the surface of the wound. This is going require sharp debridement  today. 03/15/2020 upon evaluation today patient appears to be doing fairly well in regard to her wound. She does have some slough noted on the surface of the wound currently but I do believe the Iodoflex has been beneficial over the past week. There is no signs of active infection at this time which is great news and overall very pleased with the progress. No fevers, chills, nausea, vomiting, or diarrhea. 04/02/2020 upon evaluation today patient appears to be doing a little better in regard to her wound size wise this is not tremendously smaller she does have some slough buildup on the surface of the wound. With that being said this is going require some sharp debridement today to clear away some of the necrotic debris. Fortunately there is no evidence of active infection at this time. No fevers, chills, nausea, vomiting, or diarrhea. 04/10/2020 on evaluation today patient appears to be doing well with regard to her ulcer which is measuring somewhat smaller today. She actually has more granulation tissue noted which is also good news is no need for sharp debridement today. Fortunately there is no evidence of infection either which is also excellent. No fevers, chills, nausea, vomiting, or diarrhea. [name], [name] ([id number]) [id number] Page 2 of 13 04/26/2020 on evaluation today patient's wound actually is showing signs of good improvement which is great news. There does not appear to be any evidence of active infection. I do believe she is tolerating the collagen at this point. 05/03/2020 upon evaluation today patient's wound actually showed signs of good granulation at this time there does not appear to be any evidence of active infection which is great news and overall very pleased with where things stand. With that being said I do believe that the patient is can require some sharp debridement today but fortunately nothing too significant. 05/11/20 on evaluation today  patient appears to be doing well in regard to her leg ulcer. She's been tolerating  the dressing changes without complication. Fortunately there is no signs of active infection at this time. Overall I'm very pleased with where things stand. 05/18/2020 upon evaluation today patient's wound is showing signs of improvement is very dry and I think the collagen for the most part just dried to the wound bed. I am going to clean this away with sharp debridement in order to get this under control in my opinion. 05/25/2020 upon evaluation today patient actually appears to be doing well in regard to her leg ulcer. In fact upon inspection it appears she could potentially be completely healed but that is not guaranteed based on what we are seeing. There does not appear to be any signs of active infection which is great news. 05/31/2020 on evaluation today patient appears to be doing well with regard to her wounds. She in fact appears to be almost completely healed on the left leg there is just a very small opening remaining point 1 06/08/2020 upon evaluation today patient appears to be doing excellent in regard to her leg ulcer on the left in fact it appears to be that she is completely healed as of today. I see no signs of active infection at this time which is great news. No fevers, chills, nausea, vomiting, or diarrhea. READMISSION 01/16/2021 Patient that we have had in this clinic with a wound on the left posterior calf in the summer 2021 extending into October. She was felt to have chronic venous insufficiency. Per the patient's description she was discharged with what sounds like external compression stockings although she could not afford the "$75 per leg". She therefore did not get anything. Apparently her wound that she has currently is been in existence since January. She has been followed at vein and vascular with Unna boots and I think calcium alginate. She had ABIs done on 06/25/2020 that showed an  noncompressible ABI on the right leg and the left leg but triphasic waveforms with good great toe pressures and a TBI of 0.90 on the right and 0.91 on the left Surprisingly the patient also had venous reflux studies that were really unremarkable. This included no evidence of DVT or SVT but there was no evidence of s s deep venous insufficiency or superficial venous insufficiency in the greater saphenous or short saphenous veins bilaterally. I would wonder about more central venous issues or perhaps this is all lymphedema 01/25/2021 upon evaluation today patient appears to be doing decently well and guard to her wound. The good news is the Iodoflex that is job she saw Dr. Dellia Nims last week for readmission and to be honest I think that this has done extremely well over that week. I do believe that she can tolerate the sharp debridement today which will be good. I think that cleaning off the wound will likely allow [place] to be able to use a different type of dressing I do not think the Iodoflex will even be necessary based on how clean the wound looks. Fortunately there is no sign of active infection at this time which is great news. No fevers, chills, nausea, vomiting, or diarrhea. 02/12/2021 upon evaluation today patient appears to be doing well with regard to her leg ulcer. We did switch to alginate when she came for nurse visit I think is doing much better for her. Fortunately there does not appear to be any signs of active infection at this time which is great news. No fevers, chills, nausea, vomiting, or diarrhea. 02/18/2021 upon evaluation today patient's wound  is actually showing signs of good improvement. I am very pleased with where things stand currently. I do not see any signs of active infection which is great news and overall I feel like she is making good progress. The patient likewise is happy that things are doing so well and that this is measuring somewhat smaller. 02/25/2021 upon evaluation  today patient actually appears to be doing quite well in regard to her wounds. She has been tolerating the dressing changes without complication. Fortunately there does not appear to be any signs of active infection which is great news and overall I am extremely pleased with where things stand. No fevers, chills, nausea, vomiting, or diarrhea. She does have a lot of edema I really do think she needs compression socks to be worn in order to prevent things from causing additional issues for her currently. Especially on the right leg she should be wearing compression socks all the time to be honest. 03/04/2021 upon evaluation today patient's leg ulcer actually appears to be doing pretty well which is great news. There does not appear to be any significant change but overall the appearance is better even though the size is not necessarily reflecting a great improvement. Fortunately there does not appear to be any signs of active infection. No fevers, chills, nausea, vomiting, or diarrhea. 03/12/2021 upon evaluation today patient appears to be doing well with regard to her wounds. She has been tolerating the dressing changes without complication. The good news is that the wound on her left lateral leg is doing much better and is showing signs of good improvement. Overall her swelling is controlled well as well. She does need some compression socks she plans to go Jefferey socks next week. 03/18/2021 on evaluation today patient's wound actually is showing signs of good improvement. She does have a little bit of slough this can require some sharp debridement today. 03/25/2021 upon evaluation today patient appears to be doing well with regard to her wound on the left lateral leg. She has been tolerating the dressing changes without complication. Fortunately there does not appear to be any signs of active infection at this time. No fever chills no 04/01/2021 upon evaluation today patient's wound is showing signs of  improvement and overall very pleased with where things stand at this point. There is no evidence of active infection which is great news as well and in general I think that she is making great progress. 04/09/2021 upon evaluation today patient appears to be doing well with regard to her ankle ulcer. She is making good progress currently which is great news. There does not appear to be any signs of infection also excellent news. In general I am extremely pleased with where things stand today. No fevers, chills, nausea, vomiting, or diarrhea. 04/23/2021 upon inspection today patient appears to be doing quite well with regard to her leg ulcer. She has been tolerating the dressing changes without complication. She is can require some sharp debridement today but overall seems to be doing excellent. 05/06/2021 upon evaluation today patient unfortunately appears to be doing poorly overall in regard to her swelling. She is actually significantly more swollen than last week despite the fact that her wound is doing better this is not a good trend. I think that she probably needs to see her cardiologist ASAP and I discussed that with her today. This includes the fact that honestly I think she probably is getting need an increase in her diuretic or something to try to clear  away some of the excess fluid that she is experiencing at the moment. She is not been doing her pumps even twice a day but again right now more concerned that if she were to do the pump she would fluid overload her lungs causing other issues as far as her breathing is concerned. Obviously I think she needs to contact them and move up the appointment for the 12th of something sooner 2 weeks is a bit too long to wait with how swollen she has. 05/14/2021 upon evaluation today patient's wound actually showing signs of being a little bit smaller compared to previous. She has been making good progress however. Fortunately there does not appear to be any  evidence of active infection which is great. Overall I am extremely pleased in that regard. Nonetheless I am still concerned that she is having quite a bit of issue as far as the swelling is concerned she has been placed on fluid restriction as well as an increase in the furosemide by her doctor. We will see how things progress. 05/21/2021 upon evaluation today patient appears to be doing well with regard to her wound in general. She has been tolerating the dressing changes without BRITINEY, BLAHNIK ([id number]) [id number] Page 3 of 13 complication. With that being said she has been using silver cell for a while and while this was showing signs of improvement it is now gotten to the point where this has slowed down quite significantly. I do believe that switching to a different dressing may be beneficial for her today. 05/28/2021 upon evaluation today patient actually appears to be doing quite well in regard to her wound. In fact there is really not any need for sharp debridement today based on what I see. The Hydrofera Blue is doing great and overall I think that she is managing quite nicely with the compression wrapping. Her edema is still down quite a bit with the wrapping in place. 06/11/2021 upon evaluation today patient appears to be doing well with regard to her leg ulcer. She has been tolerating the dressing changes without complication. Fortunately there does not appear to be any signs of active infection at this time which is great news. No fevers, chills, nausea, vomiting, or diarrhea. She is going require some sharp debridement today. 10/11; left leg ulcer much better looking and with improved surface area. She is using Hydrofera Blue under 3 layer compression. She does not have any stocking on the right leg which in itself has very significant nonpitting edema 06/25/2021 upon evaluation today patient appears to be doing well with regard to the wound on her left  leg. She has been tolerating the dressing changes without complication and overall I am extremely pleased with where things stand today. I do not see any evidence of infection which is great news. 07/02/2021 upon evaluation today patient's wound actually showing signs of excellent improvement and actually very pleased with where we stand today and the overall appearance. Fortunately there does not appear to be any signs of active infection. No fevers, chills, nausea, vomiting, or diarrhea. 07/09/2021 upon evaluation today patient appears to be doing better in regard to her wound this is measuring smaller and she is headed in the appropriate direction based on what I am seeing currently. I do not see any evidence of active infection systemically which is great news. 07/16/2021 upon evaluation today patient appears to be doing pretty well currently in regard to her leg ulcer. This is measuring smaller and looking much better  this is great news. Fortunately there does not appear to be any evidence of active infection systemically which is great news as well. 07/23/2021 upon evaluation today patient's wound is actually showing signs of good improvement this is definitely measuring smaller. I do not see any signs of infection which is great news and overall very pleased in that regard. Overall I think that she is doing well with the [facility]. 07/30/2020 upon evaluation today patient appears to be doing well in regard to her wound. She has been tolerating the dressing changes without complication think the Hydrofera Blue at this point is actually causing her to dry out a lot as far as the wound bed is concerned. Subsequently I think that we need to try to see what we can do to improve this. Fortunately there does not appear to be any evidence of active infection at this time which is great news. 08/06/2021 upon evaluation today patient appears to be doing well with regards to her wound. Is not measuring  significantly smaller initial inspection but upon closer inspection she has a lot of new skin growth across the central portion of the wound. I think will get very close to complete resolution. 08/13/2021 upon evaluation today patient appears to be doing well with regard to her wound. In fact this appears to be I believe healed although there is still some question as to whether it is completely so. I am concerned about the fact that to be honest she still has some evidence of dry skin that could be just trapping something underneath I still want to debride anything as I am afraid of causing some damage to the good skin for that reason I Georgina Peer probably monitor for 1 more week before completely closing everything out. 08/20/2021 upon evaluation today patient actually appears to be doing excellent. In fact she appears to be completely healed. This was the case last week as well we just wanted to make sure nothing reopened since she does not really have an ability to put on compression socks this is good to be something we need to make sure was well-healed. Nonetheless I think we have achieved that goal as of today. 12/27; this is a patient we discharged 2 weeks ago with wounds on her left lower leg laterally. She was supposed to get stockings and really did not get them. She developed blistering and reopening probably sometime late last week. She has 3 areas in the same area as previously. The mid calf dimensions on the left went from 39 to 51 cm today. She also has very significant edema on the right leg but as far as I am aware has not had wounds in this area 1/4; patient presents for follow-up. She has tolerated the compression wrap well. She has no issues or complaints today. 09/17/2021 upon evaluation today patient appears to be doing well with regard to her wounds. She has been tolerating the dressing changes without complication. Fortunately there does not appear to be any signs of active infection  at this time. No fevers, chills, nausea, vomiting, or diarrhea. 10/01/2021 upon evaluation today patient appears to be doing well with regard to her wound this is actually measuring better and looking smaller and I am very pleased in that regard. Fortunately I do not see any evidence of active infection locally nor systemically at this point. No fevers, chills, nausea, vomiting, or diarrhea. 10/08/2021 upon evaluation patient's wounds are actually showing some signs of improvement measuring a little bit smaller  and looking a little bit better. Overall I do not think there is any need for sharp debridement today which is good news. No fevers, chills, nausea, vomiting, or diarrhea. 10/14/2021 upon evaluation today patient appears to be doing well with regard to her wound. She has been tolerating the dressing changes without complication and overall I am extremely pleased with where things stand today. There is a little bit of film on the surface of the wounds but this was carefully cleaned away with just saline and gauze she really did not want me to do any sharp debridement today. 10/29/2021 upon evaluation today patient appears to be doing quite well in regard to her wounds. She is actually showing signs of excellent improvement which is great news and overall I feel like we are on the right track at this time. She does have a wound on both legs and she does need something compression wise when she heals for that reason we will get a go and see about ordering her bilateral juxta lite compression wraps. 11/05/2021 upon evaluation today patient appears to be doing well with regard to her wound. She has been tolerating the dressing changes without complication. Fortunately I do not see any evidence of active infection locally nor systemically at this time which is great news. No fevers, chills, nausea, vomiting, or diarrhea. 11/12/2021 upon evaluation today patient appears to be doing well with regard to her  wounds. She is doing excellent on the left on the right this is not doing quite as well there is some slough and biofilm buildup. 3/14; patient presents for follow-up. She has no issues or complaints today. She has tolerated the compression wraps well. 11/26/2021 upon evaluation today patient appears to be doing well with regard to her wounds. Both are showing signs of good improvement which is great news. I do not see any evidence of active infection and overall think she is healing quite nicely. 12/03/2021 upon evaluation today patient appears to be doing well with regard to her wounds. Both are showing signs of excellent improvement I am very pleased with where things stand and I think that she is making good progress here. Fortunately I do not see any signs of active infection locally or systemically which is great news. No fevers, chills, nausea, vomiting, or diarrhea. 12-10-2021 upon evaluation today patient's wounds actually appear to be doing awesome. I am extremely pleased with where we stand I think she is making excellent progress. I do not see any signs of active infection locally or systemically at this time. 12-17-2021 upon evaluation today patient's wounds are actually showing signs of good improvement. Fortunately I do not see any evidence of active infection locally nor systemically which is great news. Overall I do not believe that she is showing any signs of significant worsening which is good news. With that being said I do believe that she has a little bit of film on the surface of the wound I was actually able to clear this away with saline and gauze no sharp debridement was necessary. SHIRLEYANN, MONTERO ([id number]) [id number] Page 4 of 13 12-24-2021 upon evaluation today patient appears to be doing well with regard to her wounds. I do not see any signs of active infection currently which is great news and overall I think that we are on the right track  here. No fevers, chills, nausea, vomiting, or diarrhea. 12-31-2021 upon evaluation today patient appears to be doing well with regard to her wounds. Both are showing signs  of improvement the wound on the right leg is going require some sharp debridement on the left seems to be doing quite well. 01-07-2022 upon evaluation today patient appears to be doing well currently in regard to her wounds. She is actually making good progress bilaterally. Both of them require little bit of sharp debridement but not too much which is great news. Upon inspection patient's wound bed actually showed signs of good granulation and epithelization on both legs. She still is having significant issues with the right leg compared to the left although I do feel like both are actually showing signs of pretty good improvement. 01-21-2022 upon evaluation today patient's wound is actually showing signs of excellent improvement I am very pleased with where we stand currently. I do not see any signs of active infection locally or systemically which is great news. No fevers, chills, nausea, vomiting, or diarrhea. 01-28-2022 upon evaluation today patient appears to be doing well with regard to her wound on the left leg this is pretty much just about healed which is great news. Unfortunately in regard to the wound on her right leg this is not doing nearly as well in fact I think we probably need to see about a culture today that was discussed with her. 02-04-2022 upon evaluation today patient appears to be doing well with regard to her left leg which in fact might actually be completely healed I am not 100% sure. Nonetheless this is shown signs of excellent improvement which is great news. With that being said in regards to the right leg there is some slough and film buildup on the surface of the wound although she is feeling much better than last week this does appear to be doing much better than it was last week. Fortunately there does  not appear to be any signs of active infection locally or systemically at this time. 02-11-2022 upon evaluation today patient's wounds appear to be doing decently well. In fact the left leg is healed although there is brand-new skin were definitely given still need to wrap her for the time being. On the right leg this is showing signs of some epithelial growth in the middle of the wound it still measures the same because there is still a larger area with speckled openings but again as far as the entire area being open this is significantly improved which is great news. I am very pleased. 6/13; left leg remains healed and she has a juxta light to apply to it today. There is some concern about whether she is going to be able to do this at home. She lives with a daughter although she has her own disability. On the right the wound looks smaller to me nice epithelialization good edema control. 02-25-2022 upon evaluation today patient appears to be doing well with regard to her legs the left leg is still healed the right leg is doing much better. I think we are on the right track here. With that being said unfortunately she continues to have significant issues with some other problems and she actually tells me on Saturday she was having lunch and went inside to sit down when she tells me she had all of a sudden a sharp sensation that went from her hand through her arm into her leg and her neck and she tells me that she could not see anything for the next hour it was completely black. Slowly after this her vision started to return and has been normal since. It sounds  to me as if she may have had a TIA or mini stroke at this point. Nonetheless I think that she needs to have this checked out ASAP. Her primary care provider is Dr. Clide Deutscher at [facility]. We will get a try to get in touch with him. 03-10-2022 upon evaluation today patient's wound actually showed signs of good improvement. She seems to be making  excellent progress here. I do not see any evidence of active infection locally or systemically which is great news. No fevers, chills, nausea, vomiting, or diarrhea. She was in the hospital since have seen her last she ended up finding out that she was having syncopal episodes. That is what was going on with the blacking out and losing vision. Subsequently they adjusted some of her medication she seems to be doing better. 03-18-2022 upon evaluation today patient's wound is actually showing signs of good granulation and epithelization at this point. Fortunately I do not see any signs of active infection locally or systemically which is great news. 03-25-2022 any evidence of active infection locally or systemically which is great news. Any evidence of active infection which is great news and overall the wound is looking better. Upon evaluation today patient appears to be doing well with regard to her leg ulcer. She has been tolerating the dressing changes without complication. Fortunately I do not see 7/25; the patient's wound is on the right lateral lower extremity using silver alginate 3 layer compression. She has chronic venous insufficiency and lymphedema. We are using a juxta lite on the left leg which does not have any open wounds but she is leaving this on all week it seems that there just is not a way to get this changed at home 04-08-2022 upon evaluation today patient appears to be doing well currently in regard to her wound. She has been tolerating the dressing changes without complication. Fortunately I do not see any evidence of active infection locally or systemically which is great news. No fever or chills noted her left leg has reopened at this point. 04-15-2022 upon evaluation patient's wounds actually are showing signs of improvement which is great news. Little by little I do believe her making progress here. In regard to the right leg this is looking better though the measurement does not  speak to it there is a lot of new skin growth. On the left leg this is significantly smaller compared to last week most of the blistered area has completely closed. 04-22-2022 upon evaluation today patient appears to be doing excellent in regard to her wounds both are showing signs of improvement. Her knees and above are still extremely swollen the legs were rewrapped them are down significantly. With that being said I do not think that we will get a be able to keep them that way once were not rapid noted that she really has no way to be able to put juxta lites on and off and nobody to help her with that. That is the biggest concern that we have here at this point. Fortunately I do not think that there is any evidence of infection which is great news. 04-29-2022 upon evaluation today patient appears to be doing well with regard to her wounds. She has been tolerating the dressing changes without complication. Fortunately there does not appear to be any evidence of infection locally or systemically which is great news and overall I am extremely pleased in that regard. Left leg is healed the right leg is very close we discussed  today the possibility of her granddaughter helping with putting on and off compression wraps for her. 05-06-2022 upon evaluation today patient appears to be doing well currently in regard to her left leg which is showing signs of being completely healed. With regard to her right leg she still has an open area though this is doing much better there is a lot less drainage than what we previously noted. Fortunately I see no evidence of active infection locally or systemically at this time which is great news. 05-20-2022 upon evaluation today patient appears to be doing excellent in regard to her leg. The left leg is still healed the right leg is much better. Fortunately I see no signs of infection locally or systemically at this time which is great news. No fevers, chills, nausea,  vomiting, or diarrhea. 05-27-2022 upon evaluation today patient appears to be doing excellent in regard to her wound this is actually measuring smaller and looking much better. Fortunately I see no signs of active infection locally or systemically at this time. 06-03-2022 upon evaluation patient's wound actually is measuring smaller and looking much better. She still has a lot of discomfort but fortunately nothing to significant at this point which is great news. No fevers, chills, nausea, vomiting, or diarrhea. I think we are making some pretty good progress here. 06-10-2022 upon evaluation today patient appears to be doing well currently in regard to her wound which is actually measuring somewhat smaller. This is actually 2 separate wounds that are clustered and this is making it appear little bit larger than what it is but nonetheless we are seeing some definite improvement. Fortunately I do not see any signs of active infection at this time. No fevers, chills, nausea, vomiting, or diarrhea. 06-17-2022 upon evaluation today patient appears to be doing well currently in regard to her wound although she was in the hospital since she was last here. KELLI, ROBECK ([id number]) [id number] Page 5 of 13 That was from 06-12-2022 through 06-16-2022 which was yesterday. This was due to a CHF exacerbation. With that being said the patient has noted in her record review acute on chronic congestive heart failure with hypertensive emergency noted. She also has stage IIIa chronic kidney disease noted. With that being said I do think her wound is much better because I got a lot of fluid off to try to diurese her to a degree but her kidneys would not hold for that they put her on furosemide 40 mg and that is helping to get the fluid off she is much better but still has a lot of fluid buildup. 06-24-2022 upon evaluation today patient appears to be doing well currently in regard to her wound  is not significantly smaller but it is improved overall. Fortunately I do not see any signs of active infection locally nor systemically at this time. 07-01-2022 upon evaluation today patient appears to be doing okay in regard to her wound I do not believe the silver alginate is helping quite as much as I would like to see. I would actually recommend we switch back over to the [facility] at this point which I think may do better. Fortunately I do not see any signs of infection which is good news were going to switch to Tubigrip on the left leg on the right leg we will get a continue with the compression wrap. 07-08-2022 upon evaluation today patient unfortunately is not doing nearly as well in regard to her left leg. We had to put her  back into a 4-layer compression wrap today due to a significant blister that appeared. I do believe that we need to try to get this to reabsorb we will see how things do over the next week. With that being said the right leg is doing well the 4-layer compression wrap and switch to the [facility] seems to be very beneficial I am pleased with what we are seeing in that regard. 11/7; patient's wounds are on the right posterior lateral leg actually measures smaller and look healthy we have been using Hydrofera Blue under 3 layer compression. Last week she had a large blister that was left intact covered with alginate but the leg was wrapped in 3 layer. Today the left leg had unroofed but the skin still looks reasonably healthy. Nevertheless she is still going to require the left leg to be wrapped 07-22-2022 upon evaluation today patient unfortunately is extremely swollen in regard to her bilateral lower extremities despite the fact that she is currently being compression wrap a 4-layer compression wrap bilaterally. This is unfortunate and the fact that I believe this is not just a lower extremity swelling issue but to be honest this is a much more global issue with  edema in regard to her congestive heart failure. Her BNP has been up in the past month and I am not sure where it is as it has been rechecked she sees the heart failure clinic on Thursday in 2 days. With that being said she does tell me that she is having trouble laying down she tells me that she is having trouble walking as well. With all that being said I advised that she probably needs to go to the hospital for further evaluation. 07-29-2022 upon evaluation today patient appears to be doing well currently in regard to her wound on the right leg I am very pleased the left leg is also doing better though it is weeping quite a bit. I think we need to do something to Help calm this down a bit. She is in agreement with that plan. Electronic Signature(s) Signed: 07/29/2022 2:57:56 PM By: Worthy Keeler PA-C Entered By: Worthy Keeler on 07/29/2022 14:57:55 -------------------------------------------------------------------------------- Physical Exam Details Patient Name: Date of Service: [name] 07/29/2022 2:00 PM Medical Record Number: [id number] Patient Account Number: [id number] Date of Birth/Sex: Treating RN: 07/14/1946 (76 y.o. [name] Primary Care Provider: [name] Other Clinician: Referring Provider: Treating Provider/Extender: Mayer Masker, Ngwe Weeks in Treatment: 34 Constitutional Well-nourished and well-hydrated in no acute distress. Respiratory normal breathing without difficulty. Psychiatric this patient is able to make decisions and demonstrates good insight into disease process. Alert and Oriented x 3. pleasant and cooperative. Notes Upon inspection patient's wound bed actually showed signs of good granulation and epithelization at this point. Fortunately I do not see any evidence of active infection locally or systemically which is great news and overall I am extremely pleased with where things stand at this time. Electronic  Signature(s) Signed: 07/29/2022 2:58:17 PM By: Worthy Keeler PA-C Entered By: Worthy Keeler on 07/29/2022 14:58:16 [name] ([id number]) [id number] Page 6 of 13 -------------------------------------------------------------------------------- Physician Orders Details Patient Name: Date of Service: [name] 07/29/2022 2:00 PM Medical Record Number: [id number] Patient Account Number: [id number] Date of Birth/Sex: Treating RN: 1946/08/07 (76 y.o. 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 Nurse Visit as needed Bathing/ Shower/ Hygiene May shower with wound dressing protected with water repellent cover or cast protector. No tub bath. Anesthetic (Use 'Patient Medications' Section for Anesthetic Order Entry) Lidocaine applied to wound bed Edema Control - Lymphedema / Segmental Compressive Device / Other Bilateral Lower Extremities Optional: One layer of unna paste to top of compression wrap (to act as an anchor). Elevate, Exercise Daily and A void Standing for Long Periods of Time. Elevate legs to the level of the heart and pump ankles as often as possible Elevate leg(s) parallel to the floor when sitting. Compression Pump: Use compression pump on left lower extremity for 60 minutes, twice daily. - You may pump over wraps Compression Pump: Use compression pump on right lower extremity for 60 minutes, twice daily. - You may pump over wraps Wound  Treatment Wound #4 - Lower Leg Wound Laterality: Right, Lateral Cleanser: Soap and Water 1 x Per Week/30 Days Discharge Instructions: Gently cleanse wound with antibacterial soap, rinse and pat dry prior to dressing wounds Cleanser: Wound Cleanser 1 x Per Week/30 Days Discharge Instructions: Wash your hands with soap and water. Remove old dressing, discard into plastic bag and place into trash. Cleanse the wound with Wound Cleanser prior to applying a clean dressing using gauze sponges, not tissues or cotton balls. Do not scrub or use excessive force. Pat dry using gauze sponges, not tissue or cotton balls. Prim Dressing: Silvercel Small 2x2 (in/in) 1 x Per Week/30 Days ary Discharge Instructions: Apply Silvercel Small 2x2 (in/in) as instructed Compression Wrap: 3-LAYER WRAP - Profore Lite LF 3 Multilayer Compression Bandaging System 1 x Per Week/30 Days Discharge Instructions: Apply 3 multi-layer wrap as prescribed. Wound #6 - Lower Leg Wound Laterality: Left, Medial Cleanser: Soap and Water 1 x Per Week/30 Days Discharge Instructions: Gently cleanse wound with antibacterial soap, rinse and pat dry prior to dressing wounds Cleanser: Wound Cleanser 1 x Per Week/30 Days Discharge Instructions: Wash your hands with soap and water. Remove old dressing, discard into plastic bag and place into trash. Cleanse the wound with Wound Cleanser prior to applying a clean dressing using gauze sponges, not tissues or cotton balls. Do not scrub or use excessive force. Pat dry using gauze sponges, not tissue or cotton balls. Prim Dressing: Silvercel 4 1/4x 4 1/4 (in/in) 1 x Per Week/30 Days ary Discharge Instructions: Apply Silvercel 4 1/4x 4 1/4 (in/in) as instructed EMMALIN, JAQUESS ([id number]) [id number] Page 7 of 13 Secondary Dressing: ABD Pad 5x9 (in/in) 1 x Per Week/30 Days Discharge Instructions: Cover with ABD pad Compression Wrap: 3-LAYER WRAP - Profore Lite LF 3  Multilayer Compression Bandaging System 1 x Per Week/30 Days Discharge Instructions: Apply 3 multi-layer wrap as prescribed. Electronic Signature(s) Signed: 07/29/2022 5:41:33 PM By: Worthy Keeler PA-C Signed: 08/07/2022 4:41:36 PM By: Carlene Coria RN Entered By: Carlene Coria on 07/29/2022 14:42:28 -------------------------------------------------------------------------------- Problem List Details Patient Name: Date of Service: [name] 07/29/2022 2:00 PM Medical Record Number: [id number] Patient Account Number: [id number] Date of Birth/Sex: Treating RN: 02/19/1946 (76 y.o. [name] Primary Care Provider: [name] Other Clinician: Referring Provider: Treating Provider/Extender: Mayer Masker, Ngwe Weeks in Treatment: 7 Active Problems ICD-10 Encounter Code Description Active Date  MDM Diagnosis I89.0 Lymphedema, not elsewhere classified 01/16/2021 No Yes I87.332 Chronic venous hypertension (idiopathic) with ulcer and inflammation of left 01/16/2021 No Yes lower extremity L97.812 Non-pressure chronic ulcer of other part of right lower leg with fat layer 01/07/2022 No Yes exposed Inactive Problems ICD-10 Code Description Active Date Inactive Date L97.822 Non-pressure chronic ulcer of other part of left lower leg with fat layer exposed 01/16/2021 01/16/2021 Resolved Problems Electronic Signature(s) Signed: 07/29/2022 2:11:01 PM By: Worthy Keeler PA-C Entered By: Worthy Keeler on 07/29/2022 14:11:01 [name] ([id number]) [id number] Page 8 of 13 -------------------------------------------------------------------------------- Progress Note Details Patient Name: Date of Service: [name] 07/29/2022 2:00 PM Medical Record Number: [id number] Patient Account Number: [id number] Date of Birth/Sex: Treating RN: April 16, 1946 (76 y.o. [name] Primary Care Provider: [name] Other Clinician: Referring  Provider: Treating Provider/Extender: Mayer Masker, Ngwe Weeks in Treatment: 60 Subjective Chief Complaint Information obtained from Patient Bilateral LE Ulcers History of Present Illness (HPI) 03/15/2020 upon evaluation today patient presents today for initial evaluation here in our clinic concerning a wound that is on the left posterior lower extremity. Unfortunately this has been giving the patient some discomfort at this point which she notes has been affected her pretty much daily. The patient does have a history of venous insufficiency, hypertension, congestive heart failure, and a history of breast cancer. Fortunately there does not appear to be evidence of active infection at this time which is great news. No fevers, chills, nausea, vomiting, or diarrhea. Wound is not extremely large but does have some slough covering the surface of the wound. This is going require sharp debridement today. 03/15/2020 upon evaluation today patient appears to be doing fairly well in regard to her wound. She does have some slough noted on the surface of the wound currently but I do believe the Iodoflex has been beneficial over the past week. There is no signs of active infection at this time which is great news and overall very pleased with the progress. No fevers, chills, nausea, vomiting, or diarrhea. 04/02/2020 upon evaluation today patient appears to be doing a little better in regard to her wound size wise this is not tremendously smaller she does have some slough buildup on the surface of the wound. With that being said this is going require some sharp debridement today to clear away some of the necrotic debris. Fortunately there is no evidence of active infection at this time. No fevers, chills, nausea, vomiting, or diarrhea. 04/10/2020 on evaluation today patient appears to be doing well with regard to her ulcer which is measuring somewhat smaller today. She actually has more granulation tissue noted  which is also good news is no need for sharp debridement today. Fortunately there is no evidence of infection either which is also excellent. No fevers, chills, nausea, vomiting, or diarrhea. 04/26/2020 on evaluation today patient's wound actually is showing signs of good improvement which is great news. There does not appear to be any evidence of active infection. I do believe she is tolerating the collagen at this point. 05/03/2020 upon evaluation today patient's wound actually showed signs of good granulation at this time there does not appear to be any evidence of active infection which is great news and overall very pleased with where things stand. With that being said I do believe that the patient is can require some sharp debridement today but fortunately nothing too significant. 05/11/20 on evaluation today patient appears to be doing well in regard to  her leg ulcer. She's been tolerating the dressing changes without complication. Fortunately there is no signs of active infection at this time. Overall I'm very pleased with where things stand. 05/18/2020 upon evaluation today patient's wound is showing signs of improvement is very dry and I think the collagen for the most part just dried to the wound bed. I am going to clean this away with sharp debridement in order to get this under control in my opinion. 05/25/2020 upon evaluation today patient actually appears to be doing well in regard to her leg ulcer. In fact upon inspection it appears she could potentially be completely healed but that is not guaranteed based on what we are seeing. There does not appear to be any signs of active infection which is great news. 05/31/2020 on evaluation today patient appears to be doing well with regard to her wounds. She in fact appears to be almost completely healed on the left leg there is just a very small opening remaining point 1 06/08/2020 upon evaluation today patient appears to be doing excellent in regard  to her leg ulcer on the left in fact it appears to be that she is completely healed as of today. I see no signs of active infection at this time which is great news. No fevers, chills, nausea, vomiting, or diarrhea. READMISSION 01/16/2021 Patient that we have had in this clinic with a wound on the left posterior calf in the summer 2021 extending into October. She was felt to have chronic venous insufficiency. Per the patient's description she was discharged with what sounds like external compression stockings although she could not afford the "$75 per leg". She therefore did not get anything. Apparently her wound that she has currently is been in existence since January. She has been followed at vein and vascular with Unna boots and I think calcium alginate. She had ABIs done on 06/25/2020 that showed an noncompressible ABI on the right leg and the left leg but triphasic waveforms with good great toe pressures and a TBI of 0.90 on the right and 0.91 on the left Surprisingly the patient also had venous reflux studies that were really unremarkable. This included no evidence of DVT or SVT but there was no evidence of s s deep venous insufficiency or superficial venous insufficiency in the greater saphenous or short saphenous veins bilaterally. I would wonder about more central venous issues or perhaps this is all lymphedema 01/25/2021 upon evaluation today patient appears to be doing decently well and guard to her wound. The good news is the Iodoflex that is job she saw Dr. Dellia Nims last week for readmission and to be honest I think that this has done extremely well over that week. I do believe that she can tolerate the sharp debridement today which will be good. I think that cleaning off the wound will likely allow [place] to be able to use a different type of dressing I do not think the Iodoflex will even be necessary based on how clean the wound looks. Fortunately there is no sign of active infection at this  time which is great news. No fevers, chills, nausea, vomiting, or diarrhea. JADELYNN, BOYLAN ([id number]) [id number] Page 9 of 13 02/12/2021 upon evaluation today patient appears to be doing well with regard to her leg ulcer. We did switch to alginate when she came for nurse visit I think is doing much better for her. Fortunately there does not appear to be any signs of active infection at this time which is  great news. No fevers, chills, nausea, vomiting, or diarrhea. 02/18/2021 upon evaluation today patient's wound is actually showing signs of good improvement. I am very pleased with where things stand currently. I do not see any signs of active infection which is great news and overall I feel like she is making good progress. The patient likewise is happy that things are doing so well and that this is measuring somewhat smaller. 02/25/2021 upon evaluation today patient actually appears to be doing quite well in regard to her wounds. She has been tolerating the dressing changes without complication. Fortunately there does not appear to be any signs of active infection which is great news and overall I am extremely pleased with where things stand. No fevers, chills, nausea, vomiting, or diarrhea. She does have a lot of edema I really do think she needs compression socks to be worn in order to prevent things from causing additional issues for her currently. Especially on the right leg she should be wearing compression socks all the time to be honest. 03/04/2021 upon evaluation today patient's leg ulcer actually appears to be doing pretty well which is great news. There does not appear to be any significant change but overall the appearance is better even though the size is not necessarily reflecting a great improvement. Fortunately there does not appear to be any signs of active infection. No fevers, chills, nausea, vomiting, or diarrhea. 03/12/2021 upon evaluation today patient  appears to be doing well with regard to her wounds. She has been tolerating the dressing changes without complication. The good news is that the wound on her left lateral leg is doing much better and is showing signs of good improvement. Overall her swelling is controlled well as well. She does need some compression socks she plans to go Jefferey socks next week. 03/18/2021 on evaluation today patient's wound actually is showing signs of good improvement. She does have a little bit of slough this can require some sharp debridement today. 03/25/2021 upon evaluation today patient appears to be doing well with regard to her wound on the left lateral leg. She has been tolerating the dressing changes without complication. Fortunately there does not appear to be any signs of active infection at this time. No fever chills no 04/01/2021 upon evaluation today patient's wound is showing signs of improvement and overall very pleased with where things stand at this point. There is no evidence of active infection which is great news as well and in general I think that she is making great progress. 04/09/2021 upon evaluation today patient appears to be doing well with regard to her ankle ulcer. She is making good progress currently which is great news. There does not appear to be any signs of infection also excellent news. In general I am extremely pleased with where things stand today. No fevers, chills, nausea, vomiting, or diarrhea. 04/23/2021 upon inspection today patient appears to be doing quite well with regard to her leg ulcer. She has been tolerating the dressing changes without complication. She is can require some sharp debridement today but overall seems to be doing excellent. 05/06/2021 upon evaluation today patient unfortunately appears to be doing poorly overall in regard to her swelling. She is actually significantly more swollen than last week despite the fact that her wound is doing better this is not a  good trend. I think that she probably needs to see her cardiologist ASAP and I discussed that with her today. This includes the fact that honestly I think she  probably is getting need an increase in her diuretic or something to try to clear away some of the excess fluid that she is experiencing at the moment. She is not been doing her pumps even twice a day but again right now more concerned that if she were to do the pump she would fluid overload her lungs causing other issues as far as her breathing is concerned. Obviously I think she needs to contact them and move up the appointment for the 12th of something sooner 2 weeks is a bit too long to wait with how swollen she has. 05/14/2021 upon evaluation today patient's wound actually showing signs of being a little bit smaller compared to previous. She has been making good progress however. Fortunately there does not appear to be any evidence of active infection which is great. Overall I am extremely pleased in that regard. Nonetheless I am still concerned that she is having quite a bit of issue as far as the swelling is concerned she has been placed on fluid restriction as well as an increase in the furosemide by her doctor. We will see how things progress. 05/21/2021 upon evaluation today patient appears to be doing well with regard to her wound in general. She has been tolerating the dressing changes without complication. With that being said she has been using silver cell for a while and while this was showing signs of improvement it is now gotten to the point where this has slowed down quite significantly. I do believe that switching to a different dressing may be beneficial for her today. 05/28/2021 upon evaluation today patient actually appears to be doing quite well in regard to her wound. In fact there is really not any need for sharp debridement today based on what I see. The Hydrofera Blue is doing great and overall I think that she is managing  quite nicely with the compression wrapping. Her edema is still down quite a bit with the wrapping in place. 06/11/2021 upon evaluation today patient appears to be doing well with regard to her leg ulcer. She has been tolerating the dressing changes without complication. Fortunately there does not appear to be any signs of active infection at this time which is great news. No fevers, chills, nausea, vomiting, or diarrhea. She is going require some sharp debridement today. 10/11; left leg ulcer much better looking and with improved surface area. She is using Hydrofera Blue under 3 layer compression. She does not have any stocking on the right leg which in itself has very significant nonpitting edema 06/25/2021 upon evaluation today patient appears to be doing well with regard to the wound on her left leg. She has been tolerating the dressing changes without complication and overall I am extremely pleased with where things stand today. I do not see any evidence of infection which is great news. 07/02/2021 upon evaluation today patient's wound actually showing signs of excellent improvement and actually very pleased with where we stand today and the overall appearance. Fortunately there does not appear to be any signs of active infection. No fevers, chills, nausea, vomiting, or diarrhea. 07/09/2021 upon evaluation today patient appears to be doing better in regard to her wound this is measuring smaller and she is headed in the appropriate direction based on what I am seeing currently. I do not see any evidence of active infection systemically which is great news. 07/16/2021 upon evaluation today patient appears to be doing pretty well currently in regard to her leg ulcer. This is  measuring smaller and looking much better this is great news. Fortunately there does not appear to be any evidence of active infection systemically which is great news as well. 07/23/2021 upon evaluation today patient's wound is  actually showing signs of good improvement this is definitely measuring smaller. I do not see any signs of infection which is great news and overall very pleased in that regard. Overall I think that she is doing well with the [facility]. 07/30/2020 upon evaluation today patient appears to be doing well in regard to her wound. She has been tolerating the dressing changes without complication think the Hydrofera Blue at this point is actually causing her to dry out a lot as far as the wound bed is concerned. Subsequently I think that we need to try to see what we can do to improve this. Fortunately there does not appear to be any evidence of active infection at this time which is great news. 08/06/2021 upon evaluation today patient appears to be doing well with regards to her wound. Is not measuring significantly smaller initial inspection but upon closer inspection she has a lot of new skin growth across the central portion of the wound. I think will get very close to complete resolution. 08/13/2021 upon evaluation today patient appears to be doing well with regard to her wound. In fact this appears to be I believe healed although there is still some question as to whether it is completely so. I am concerned about the fact that to be honest she still has some evidence of dry skin that could be just trapping something underneath I still want to debride anything as I am afraid of causing some damage to the good skin for that reason I Georgina Peer probably monitor for 1 more week before completely closing everything out. 08/20/2021 upon evaluation today patient actually appears to be doing excellent. In fact she appears to be completely healed. This was the case last week as ATLANTIS, DELONG ([id number]) [id number] Page 10 of 13 well we just wanted to make sure nothing reopened since she does not really have an ability to put on compression socks this is good to be something we  need to make sure was well-healed. Nonetheless I think we have achieved that goal as of today. 12/27; this is a patient we discharged 2 weeks ago with wounds on her left lower leg laterally. She was supposed to get stockings and really did not get them. She developed blistering and reopening probably sometime late last week. She has 3 areas in the same area as previously. The mid calf dimensions on the left went from 39 to 51 cm today. She also has very significant edema on the right leg but as far as I am aware has not had wounds in this area 1/4; patient presents for follow-up. She has tolerated the compression wrap well. She has no issues or complaints today. 09/17/2021 upon evaluation today patient appears to be doing well with regard to her wounds. She has been tolerating the dressing changes without complication. Fortunately there does not appear to be any signs of active infection at this time. No fevers, chills, nausea, vomiting, or diarrhea. 10/01/2021 upon evaluation today patient appears to be doing well with regard to her wound this is actually measuring better and looking smaller and I am very pleased in that regard. Fortunately I do not see any evidence of active infection locally nor systemically at this point. No fevers, chills, nausea, vomiting, or diarrhea. 10/08/2021 upon evaluation  patient's wounds are actually showing some signs of improvement measuring a little bit smaller and looking a little bit better. Overall I do not think there is any need for sharp debridement today which is good news. No fevers, chills, nausea, vomiting, or diarrhea. 10/14/2021 upon evaluation today patient appears to be doing well with regard to her wound. She has been tolerating the dressing changes without complication and overall I am extremely pleased with where things stand today. There is a little bit of film on the surface of the wounds but this was carefully cleaned away with just saline and gauze she  really did not want me to do any sharp debridement today. 10/29/2021 upon evaluation today patient appears to be doing quite well in regard to her wounds. She is actually showing signs of excellent improvement which is great news and overall I feel like we are on the right track at this time. She does have a wound on both legs and she does need something compression wise when she heals for that reason we will get a go and see about ordering her bilateral juxta lite compression wraps. 11/05/2021 upon evaluation today patient appears to be doing well with regard to her wound. She has been tolerating the dressing changes without complication. Fortunately I do not see any evidence of active infection locally nor systemically at this time which is great news. No fevers, chills, nausea, vomiting, or diarrhea. 11/12/2021 upon evaluation today patient appears to be doing well with regard to her wounds. She is doing excellent on the left on the right this is not doing quite as well there is some slough and biofilm buildup. 3/14; patient presents for follow-up. She has no issues or complaints today. She has tolerated the compression wraps well. 11/26/2021 upon evaluation today patient appears to be doing well with regard to her wounds. Both are showing signs of good improvement which is great news. I do not see any evidence of active infection and overall think she is healing quite nicely. 12/03/2021 upon evaluation today patient appears to be doing well with regard to her wounds. Both are showing signs of excellent improvement I am very pleased with where things stand and I think that she is making good progress here. Fortunately I do not see any signs of active infection locally or systemically which is great news. No fevers, chills, nausea, vomiting, or diarrhea. 12-10-2021 upon evaluation today patient's wounds actually appear to be doing awesome. I am extremely pleased with where we stand I think she is  making excellent progress. I do not see any signs of active infection locally or systemically at this time. 12-17-2021 upon evaluation today patient's wounds are actually showing signs of good improvement. Fortunately I do not see any evidence of active infection locally nor systemically which is great news. Overall I do not believe that she is showing any signs of significant worsening which is good news. With that being said I do believe that she has a little bit of film on the surface of the wound I was actually able to clear this away with saline and gauze no sharp debridement was necessary. 12-24-2021 upon evaluation today patient appears to be doing well with regard to her wounds. I do not see any signs of active infection currently which is great news and overall I think that we are on the right track here. No fevers, chills, nausea, vomiting, or diarrhea. 12-31-2021 upon evaluation today patient appears to be doing well with regard to  her wounds. Both are showing signs of improvement the wound on the right leg is going require some sharp debridement on the left seems to be doing quite well. 01-07-2022 upon evaluation today patient appears to be doing well currently in regard to her wounds. She is actually making good progress bilaterally. Both of them require little bit of sharp debridement but not too much which is great news. Upon inspection patient's wound bed actually showed signs of good granulation and epithelization on both legs. She still is having significant issues with the right leg compared to the left although I do feel like both are actually showing signs of pretty good improvement. 01-21-2022 upon evaluation today patient's wound is actually showing signs of excellent improvement I am very pleased with where we stand currently. I do not see any signs of active infection locally or systemically which is great news. No fevers, chills, nausea, vomiting, or diarrhea. 01-28-2022 upon  evaluation today patient appears to be doing well with regard to her wound on the left leg this is pretty much just about healed which is great news. Unfortunately in regard to the wound on her right leg this is not doing nearly as well in fact I think we probably need to see about a culture today that was discussed with her. 02-04-2022 upon evaluation today patient appears to be doing well with regard to her left leg which in fact might actually be completely healed I am not 100% sure. Nonetheless this is shown signs of excellent improvement which is great news. With that being said in regards to the right leg there is some slough and film buildup on the surface of the wound although she is feeling much better than last week this does appear to be doing much better than it was last week. Fortunately there does not appear to be any signs of active infection locally or systemically at this time. 02-11-2022 upon evaluation today patient's wounds appear to be doing decently well. In fact the left leg is healed although there is brand-new skin were definitely given still need to wrap her for the time being. On the right leg this is showing signs of some epithelial growth in the middle of the wound it still measures the same because there is still a larger area with speckled openings but again as far as the entire area being open this is significantly improved which is great news. I am very pleased. 6/13; left leg remains healed and she has a juxta light to apply to it today. There is some concern about whether she is going to be able to do this at home. She lives with a daughter although she has her own disability. On the right the wound looks smaller to me nice epithelialization good edema control. 02-25-2022 upon evaluation today patient appears to be doing well with regard to her legs the left leg is still healed the right leg is doing much better. I think we are on the right track here. With that being  said unfortunately she continues to have significant issues with some other problems and she actually tells me on Saturday she was having lunch and went inside to sit down when she tells me she had all of a sudden a sharp sensation that went from her hand through her arm into her leg and her neck and she tells me that she could not see anything for the next hour it was completely black. Slowly after this her vision started to return  and has been normal since. It sounds to me as if she may have had a TIA or mini stroke at this point. Nonetheless I think that she needs to have this checked out ASAP. Her primary care provider is Dr. Clide Deutscher at [facility] We will get a try to get in touch with him. 03-10-2022 upon evaluation today patient's wound actually showed signs of good improvement. She seems to be making excellent progress here. I do not see ANTRICE, PAL ([id number]) [id number] Page 11 of 13 any evidence of active infection locally or systemically which is great news. No fevers, chills, nausea, vomiting, or diarrhea. She was in the hospital since have seen her last she ended up finding out that she was having syncopal episodes. That is what was going on with the blacking out and losing vision. Subsequently they adjusted some of her medication she seems to be doing better. 03-18-2022 upon evaluation today patient's wound is actually showing signs of good granulation and epithelization at this point. Fortunately I do not see any signs of active infection locally or systemically which is great news. 03-25-2022 any evidence of active infection locally or systemically which is great news. Any evidence of active infection which is great news and overall the wound is looking better. Upon evaluation today patient appears to be doing well with regard to her leg ulcer. She has been tolerating the dressing changes without complication. Fortunately I do not see 7/25; the  patient's wound is on the right lateral lower extremity using silver alginate 3 layer compression. She has chronic venous insufficiency and lymphedema. We are using a juxta lite on the left leg which does not have any open wounds but she is leaving this on all week it seems that there just is not a way to get this changed at home 04-08-2022 upon evaluation today patient appears to be doing well currently in regard to her wound. She has been tolerating the dressing changes without complication. Fortunately I do not see any evidence of active infection locally or systemically which is great news. No fever or chills noted her left leg has reopened at this point. 04-15-2022 upon evaluation patient's wounds actually are showing signs of improvement which is great news. Little by little I do believe her making progress here. In regard to the right leg this is looking better though the measurement does not speak to it there is a lot of new skin growth. On the left leg this is significantly smaller compared to last week most of the blistered area has completely closed. 04-22-2022 upon evaluation today patient appears to be doing excellent in regard to her wounds both are showing signs of improvement. Her knees and above are still extremely swollen the legs were rewrapped them are down significantly. With that being said I do not think that we will get a be able to keep them that way once were not rapid noted that she really has no way to be able to put juxta lites on and off and nobody to help her with that. That is the biggest concern that we have here at this point. Fortunately I do not think that there is any evidence of infection which is great news. 04-29-2022 upon evaluation today patient appears to be doing well with regard to her wounds. She has been tolerating the dressing changes without complication. Fortunately there does not appear to be any evidence of infection locally or systemically which is great  news and overall I am extremely pleased  in that regard. Left leg is healed the right leg is very close we discussed today the possibility of her granddaughter helping with putting on and off compression wraps for her. 05-06-2022 upon evaluation today patient appears to be doing well currently in regard to her left leg which is showing signs of being completely healed. With regard to her right leg she still has an open area though this is doing much better there is a lot less drainage than what we previously noted. Fortunately I see no evidence of active infection locally or systemically at this time which is great news. 05-20-2022 upon evaluation today patient appears to be doing excellent in regard to her leg. The left leg is still healed the right leg is much better. Fortunately I see no signs of infection locally or systemically at this time which is great news. No fevers, chills, nausea, vomiting, or diarrhea. 05-27-2022 upon evaluation today patient appears to be doing excellent in regard to her wound this is actually measuring smaller and looking much better. Fortunately I see no signs of active infection locally or systemically at this time. 06-03-2022 upon evaluation patient's wound actually is measuring smaller and looking much better. She still has a lot of discomfort but fortunately nothing to significant at this point which is great news. No fevers, chills, nausea, vomiting, or diarrhea. I think we are making some pretty good progress here. 06-10-2022 upon evaluation today patient appears to be doing well currently in regard to her wound which is actually measuring somewhat smaller. This is actually 2 separate wounds that are clustered and this is making it appear little bit larger than what it is but nonetheless we are seeing some definite improvement. Fortunately I do not see any signs of active infection at this time. No fevers, chills, nausea, vomiting, or diarrhea. 06-17-2022 upon  evaluation today patient appears to be doing well currently in regard to her wound although she was in the hospital since she was last here. That was from 06-12-2022 through 06-16-2022 which was yesterday. This was due to a CHF exacerbation. With that being said the patient has noted in her record review acute on chronic congestive heart failure with hypertensive emergency noted. She also has stage IIIa chronic kidney disease noted. With that being said I do think her wound is much better because I got a lot of fluid off to try to diurese her to a degree but her kidneys would not hold for that they put her on furosemide 40 mg and that is helping to get the fluid off she is much better but still has a lot of fluid buildup. 06-24-2022 upon evaluation today patient appears to be doing well currently in regard to her wound is not significantly smaller but it is improved overall. Fortunately I do not see any signs of active infection locally nor systemically at this time. 07-01-2022 upon evaluation today patient appears to be doing okay in regard to her wound I do not believe the silver alginate is helping quite as much as I would like to see. I would actually recommend we switch back over to the [facility] at this point which I think may do better. Fortunately I do not see any signs of infection which is good news were going to switch to Tubigrip on the left leg on the right leg we will get a continue with the compression wrap. 07-08-2022 upon evaluation today patient unfortunately is not doing nearly as well in regard to her left  leg. We had to put her back into a 4-layer compression wrap today due to a significant blister that appeared. I do believe that we need to try to get this to reabsorb we will see how things do over the next week. With that being said the right leg is doing well the 4-layer compression wrap and switch to the [facility] seems to be very beneficial I am pleased with what we are  seeing in that regard. 11/7; patient's wounds are on the right posterior lateral leg actually measures smaller and look healthy we have been using Hydrofera Blue under 3 layer compression. Last week she had a large blister that was left intact covered with alginate but the leg was wrapped in 3 layer. Today the left leg had unroofed but the skin still looks reasonably healthy. Nevertheless she is still going to require the left leg to be wrapped 07-22-2022 upon evaluation today patient unfortunately is extremely swollen in regard to her bilateral lower extremities despite the fact that she is currently being compression wrap a 4-layer compression wrap bilaterally. This is unfortunate and the fact that I believe this is not just a lower extremity swelling issue but to be honest this is a much more global issue with edema in regard to her congestive heart failure. Her BNP has been up in the past month and I am not sure where it is as it has been rechecked she sees the heart failure clinic on Thursday in 2 days. With that being said she does tell me that she is having trouble laying down she tells me that she is having trouble walking as well. With all that being said I advised that she probably needs to go to the hospital for further evaluation. 07-29-2022 upon evaluation today patient appears to be doing well currently in regard to her wound on the right leg I am very pleased the left leg is also doing better though it is weeping quite a bit. I think we need to do something to Help calm this down a bit. She is in agreement with that plan. SCOTTIE, STANISH ([id number]) [id number] Page 12 of 13 Constitutional Well-nourished and well-hydrated in no acute distress. Vitals Time Taken: 2:25 PM, Height: 69 in, Weight: 244 lbs, BMI: 36, Temperature: 98.5 F, Pulse: 61 bpm, Respiratory Rate: 18 breaths/min, Blood Pressure: 146/82 mmHg. Respiratory normal breathing  without difficulty. Psychiatric this patient is able to make decisions and demonstrates good insight into disease process. Alert and Oriented x 3. pleasant and cooperative. General Notes: Upon inspection patient's wound bed actually showed signs of good granulation and epithelization at this point. Fortunately I do not see any evidence of active infection locally or systemically which is great news and overall I am extremely pleased with where things stand at this time. Integumentary (Hair, Skin) Wound #4 status is Open. Original cause of wound was Gradually Appeared. The date acquired was: 10/27/2021. The wound has been in treatment 39 weeks. The wound is located on the Right,Lateral Lower Leg. The wound measures 2cm length x 1.6cm width x 0.1cm depth; 2.513cm^2 area and 0.251cm^3 volume. There is Fat Layer (Subcutaneous Tissue) exposed. There is no tunneling or undermining noted. There is a medium amount of serosanguineous drainage noted. There is medium (34-66%) pink granulation within the wound bed. There is a medium (34-66%) amount of necrotic tissue within the wound bed including Adherent Slough. Wound #6 status is Open. Original cause of wound was Blister. The date acquired was: 07/09/2022. The  wound has been in treatment 1 weeks. The wound is located on the Left,Medial Lower Leg. The wound measures 10cm length x 9cm width x 0.1cm depth; 70.686cm^2 area and 7.069cm^3 volume. There is no tunneling or undermining noted. There is a medium amount of serosanguineous drainage noted. There is no granulation within the wound bed. There is no necrotic tissue within the wound bed. 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 Nurse Visit as  needed Bathing/ Shower/ Hygiene: May shower with wound dressing protected with water repellent cover or cast protector. No tub bath. Anesthetic (Use 'Patient Medications' Section for Anesthetic Order Entry): Lidocaine applied to wound bed Edema Control - Lymphedema / Segmental Compressive Device / Other: Optional: One layer of unna paste to top of compression wrap (to act as an anchor). Elevate, Exercise Daily and Avoid Standing for Long Periods of Time. Elevate legs to the level of the heart and pump ankles as often as possible Elevate leg(s) parallel to the floor when sitting. Compression Pump: Use compression pump on left lower extremity for 60 minutes, twice daily. - You may pump over wraps Compression Pump: Use compression pump on right lower extremity for 60 minutes, twice daily. - You may pump over wraps WOUND #4: - Lower Leg Wound Laterality: Right, Lateral Cleanser: Soap and Water 1 x Per Week/30 Days Discharge Instructions: Gently cleanse wound with antibacterial soap, rinse and pat dry prior to dressing wounds Cleanser: Wound Cleanser 1 x Per Week/30 Days Discharge Instructions: Wash your hands with soap and water. Remove old dressing, discard into plastic bag and place into trash. Cleanse the wound with Wound Cleanser prior to applying a clean dressing using gauze sponges, not tissues or cotton balls. Do not scrub or use excessive force. Pat dry using gauze sponges, not tissue or cotton balls. Prim Dressing: Silvercel Small 2x2 (in/in) 1 x Per Week/30 Days ary Discharge Instructions: Apply Silvercel Small 2x2 (in/in) as instructed Com pression Wrap: 3-LAYER WRAP - Profore Lite LF 3 Multilayer Compression Bandaging System 1 x Per Week/30 Days Discharge Instructions: Apply 3 multi-layer wrap as prescribed. WOUND #6: - Lower Leg Wound Laterality: Left, Medial Cleanser: Soap and Water 1 x Per Week/30 Days Discharge Instructions: Gently cleanse wound with antibacterial soap, rinse  and pat dry prior to dressing wounds Cleanser: Wound Cleanser 1 x Per Week/30 Days Discharge Instructions: Wash your hands with soap and water. Remove old dressing, discard into plastic bag and place into trash. Cleanse the wound with Wound Cleanser prior to applying a clean dressing using gauze sponges, not tissues or cotton balls. Do not scrub or use excessive force. Pat dry using gauze sponges, not tissue or cotton balls. Prim Dressing: Silvercel 4 1/4x 4 1/4 (in/in) 1 x Per Week/30 Days ary Discharge Instructions: Apply Silvercel 4 1/4x 4 1/4 (in/in) as instructed Secondary Dressing: ABD Pad 5x9 (in/in) 1 x Per Week/30 Days Discharge Instructions: Cover with ABD pad Com pression Wrap: 3-LAYER WRAP - Profore Lite LF 3 Multilayer Compression Bandaging System 1 x Per Week/30 Days Discharge Instructions: Apply 3 multi-layer wrap as prescribed. BLEU, MOISAN ([id number]) [id number] Page 13 of 13 1. I am going to suggest that we have the patient continue to monitor for any signs of infection or worsening of see if anything changes she should contact the office  and let me know. 2. I am going to recommend that we continue specifically with the [facility] for the right leg and silver alginate for the left leg. 3. We will continue with the bilateral 3 layer compression wraps. 4. She still needs to see her heart doctor in order to discuss her fluid retention issues that she is having. That is for December 1 I believe I was told. We will see patient back for reevaluation in 1 week here in the clinic. If anything worsens or changes patient will contact our office for additional recommendations. Electronic Signature(s) Signed: 07/29/2022 2:59:04 PM By: Worthy Keeler PA-C Entered By: Worthy Keeler on 07/29/2022 14:59:04 -------------------------------------------------------------------------------- SuperBill Details Patient Name: Date of Service: [name] Bolster  [name] 07/29/2022 Medical Record Number: [id number] Patient Account Number: [id number] Date of Birth/Sex: Treating RN: 1946-07-30 (76 y.o. 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 Modifier: Quantity: 1 Physician Procedures : CPT4 Code Description Modifier [id number] - WC PHYS LEVEL 3 - EST PT ICD-10 Diagnosis Description I89.0 Lymphedema, not elsewhere classified I87.332 Chronic venous hypertension (idiopathic) with ulcer and inflammation of left lower extremity L97.812  Non-pressure chronic ulcer of other part of right lower leg with fat layer exposed Quantity: 1 Electronic Signature(s) Signed: 08/05/2022 4:05:42 PM By: Carlene Coria RN Signed: 08/07/2022 5:46:53 PM By: Worthy Keeler PA-C Previous Signature: 07/29/2022 2:59:18 PM Version By: Worthy Keeler PA-C Entered By: Carlene Coria on 08/05/2022 16:05:41

## 2022-07-29 NOTE — Progress Notes (Addendum)
[name], [name] ([id number]) [id number] Page 1 of 10 Visit Report for 07/29/2022 Arrival Information Details Patient Name: Date of Service: [name] 07/29/2022 2:00 PM Medical Record Number: [id number] Patient Account Number: [id number] Date of Birth/Sex: Treating RN: [birth year] (76 y.o. [name] Primary Care Francile Woolford: [name] Other Clinician: Referring Solace Wendorff: Treating [name]: [name], [name] in Treatment: 75 Visit Information History Since Last Visit All ordered tests and consults were completed: No Patient Arrived: Wheel Chair Added or deleted any medications: No Arrival Time: 14:18 Any new allergies or adverse reactions: No Accompanied By: self Had a fall or experienced change in No Transfer Assistance: None activities of daily living that may affect Patient Identification Verified: Yes risk of falls: Secondary Verification Process Completed: Yes Signs or symptoms of abuse/neglect since last visito No Patient Requires Transmission-Based Precautions: No Hospitalized since last visit: No Patient Has Alerts: Yes Implantable device outside of the clinic excluding No Patient Alerts: NOT DIABETIC cellular tissue based products placed in the center since last visit: Has Dressing in Place as Prescribed: Yes Has Compression in Place as Prescribed: Yes Pain Present Now: No Electronic Signature(s) Signed: 08/07/2022 4:41:36 PM By: [name] Coria RN Entered By: [name] on 07/29/2022 14:25:23 -------------------------------------------------------------------------------- Clinic Level of Care Assessment Details Patient Name: Date of Service: [name] 07/29/2022 2:00 PM Medical Record Number: [id number] Patient Account Number: [id number] Date of Birth/Sex: Treating RN: 1945/09/28 (76 y.o. [name] Primary Care Lajada Janes: [name] Other Clinician: Referring Charell Faulk: Treating  [name]: [name], [name] in Treatment: 93 Clinic Level of Care Assessment Items TOOL 1 Quantity Score '[]'$  - 0 Use when EandM and Procedure is performed on INITIAL visit ASSESSMENTS - Nursing Assessment / Reassessment '[]'$  - 0 General Physical Exam (combine w/ comprehensive assessment (listed just below) when performed on new pt. [address] [name], [name] ([id number]) [id number] Page 2 of 10 '[]'$  - 0 Comprehensive Assessment (HX, ROS, Risk Assessments, Wounds Hx, etc.) ASSESSMENTS - Wound and Skin Assessment / Reassessment '[]'$  - 0 Dermatologic / Skin Assessment (not related to wound area) ASSESSMENTS - Ostomy and/or Continence Assessment and Care '[]'$  - 0 Incontinence Assessment and Management '[]'$  - 0 Ostomy Care Assessment and Management (repouching, etc.) PROCESS - Coordination of Care '[]'$  - 0 Simple Patient / Family Education for ongoing care '[]'$  - 0 Complex (extensive) Patient / Family Education for ongoing care '[]'$  - 0 Staff obtains Programmer, systems, Records, T Results / Process Orders est '[]'$  - 0 Staff telephones HHA, Nursing Homes / Clarify orders / etc '[]'$  - 0 Routine Transfer to another Facility (non-emergent condition) '[]'$  - 0 Routine Hospital Admission (non-emergent condition) '[]'$  - 0 New Admissions / Biomedical engineer / Ordering NPWT Apligraf, etc. , '[]'$  - 0 Emergency Hospital Admission (emergent condition) PROCESS - Special Needs '[]'$  - 0 Pediatric / Minor Patient Management '[]'$  - 0 Isolation Patient Management '[]'$  - 0 Hearing / Language / Visual special needs '[]'$  - 0 Assessment of Community assistance (transportation, D/C planning, etc.) '[]'$  - 0 Additional assistance / Altered mentation '[]'$  - 0 Support Surface(s) Assessment (bed, cushion, seat, etc.) INTERVENTIONS - Miscellaneous '[]'$  - 0 External ear exam '[]'$  - 0 Patient Transfer (multiple staff / Civil Service fast streamer / Similar devices) '[]'$  - 0 Simple Staple / Suture removal (25 or  less) '[]'$  - 0 Complex Staple / Suture removal (26 or more) '[]'$  - 0 Hypo/Hyperglycemic Management (do not check if billed separately) '[]'$  - 0 Ankle / Brachial Index (  ABI) - do not check if billed separately Has the patient been seen at the hospital within the last three years: Yes Total Score: 0 Level Of Care: ____ Electronic Signature(s) Signed: 08/07/2022 4:41:36 PM By: [name] Coria RN Entered By: [name] on 08/05/2022 16:05:27 -------------------------------------------------------------------------------- Compression Therapy Details Patient Name: Date of Service: [name] 07/29/2022 2:00 PM Medical Record Number: [id number] Patient Account Number: [id number] Date of Birth/Sex: Treating RN: Jan 22, 1946 (76 y.o. [name] Primary Care Vergene Marland: [name] Other Clinician: INGRIS, [name] ([id number]) [id number] Page 3 of 10 Referring Hong Timm: Treating Kenyon Eichelberger/Extender: [name], [name] in Treatment: 79 Compression Therapy Performed for Wound Assessment: Wound #4 Right,Lateral Lower Leg Performed By: Clinician [name] Coria, RN Compression Type: Three Layer Post Procedure Diagnosis Same as Pre-procedure Electronic Signature(s) Signed: 08/05/2022 4:05:01 PM By: [name] Coria RN Entered By: [name] on 08/05/2022 16:05:01 -------------------------------------------------------------------------------- Compression Therapy Details Patient Name: Date of Service: [name] 07/29/2022 2:00 PM Medical Record Number: [id number] Patient Account Number: [id number] Date of Birth/Sex: Treating RN: [birth year] (76 y.o. [name] Primary Care Mekiah Wahler: [name] Other Clinician: Referring Timmey Lamba: Treating Nateisha Moyd/Extender: [name], [name] in Treatment: 62 Compression Therapy Performed for Wound Assessment: Wound #6 Left,Medial Lower Leg Performed By: Clinician [name] Coria,  RN Compression Type: Three Layer Post Procedure Diagnosis Same as Pre-procedure Electronic Signature(s) Signed: 08/05/2022 4:05:17 PM By: [name] Coria RN Entered By: [name] on 08/05/2022 16:05:17 -------------------------------------------------------------------------------- Encounter Discharge Information Details Patient Name: Date of Service: [name] 07/29/2022 2:00 PM Medical Record Number: [id number] Patient Account Number: [id number] Date of Birth/Sex: Treating RN: 06-08-1946 (76 y.o. [name] Primary Care Gilliam Hawkes: [name] Other Clinician: Referring Shelvy Heckert: Treating Keileigh Vahey/Extender: [name] [name] in Treatment: 5 Encounter Discharge Information Items Discharge Condition: Stable Ambulatory Status: Wheelchair Discharge Destination: [place], [place] ([id number]) [id number] Page 4 of 10 Transportation: Private Auto Accompanied By: self Schedule Follow-up Appointment: Yes Clinical Summary of Care: Electronic Signature(s) Signed: 08/05/2022 4:06:42 PM By: [name] Coria RN Entered By: [name] on 08/05/2022 16:06:42 -------------------------------------------------------------------------------- Lower Extremity Assessment Details Patient Name: Date of Service: [name] 07/29/2022 2:00 PM Medical Record Number: [id number] Patient Account Number: [id number] Date of Birth/Sex: Treating RN: Jan 08, 1946 (76 y.o. [name] Primary Care Annlee Glandon: [name] Other Clinician: Referring Taylore Hinde: Treating Quinnten Calvin/Extender: [name], [name] in Treatment: 79 Edema Assessment Assessed: [Left: No] [Right: No] Edema: [Left: Yes] [Right: Yes] Calf Left: Right: Point of Measurement: From Medial Instep 42 cm 45 cm Ankle Left: Right: Point of Measurement: From Medial Instep 24 cm 24 cm Electronic Signature(s) Signed: 08/07/2022 4:41:36 PM By: [name] Coria RN Entered  By: [name] on 07/29/2022 14:35:47 -------------------------------------------------------------------------------- Multi Wound Chart Details Patient Name: Date of Service: [name] 07/29/2022 2:00 PM Medical Record Number: [id number] Patient Account Number: [id number] Date of Birth/Sex: Treating RN: September 20, 1945 (76 y.o. [name] Primary Care Taffie Eckmann: [name] Other Clinician: Referring Steven Veazie: Treating Annelies Coyt/Extender: [name], [name] in Treatment: [address] [place], [place] ([id number]) [id number] Page 5 of 10 Height(in): 69 Pulse(bpm): 61 Weight(lbs): 244 Blood Pressure(mmHg): 146/82 Body Mass Index(BMI): 36 Temperature(F): 98.5 Respiratory Rate(breaths/min): 18 [4:Photos:] [N/A:N/A] Right, Lateral Lower Leg Left, Medial Lower Leg N/A Wound Location: Gradually Appeared Blister N/A Wounding Event: Venous Leg Ulcer Lymphedema N/A Primary Etiology: Cataracts, Lymphedema, Sleep Cataracts, Lymphedema, Sleep N/A Comorbid History: Apnea, Congestive Heart Failure, Apnea, Congestive Heart Failure,  Hypertension, Osteoarthritis, Hypertension, Osteoarthritis, Neuropathy, Received Chemotherapy, Neuropathy, Received Chemotherapy, Received Radiation Received Radiation 10/27/2021 07/09/2022 N/A Date Acquired: 15 1 N/A [name] of Treatment: Open Open N/A Wound Status: No No N/A Wound Recurrence: 2x1.6x0.1 10x9x0.1 N/A Measurements L x W x D (cm) 2.513 70.686 N/A A (cm) : rea 0.251 7.069 N/A Volume (cm) : -113.30% 18.20% N/A % Reduction in Area: -112.70% 18.20% N/A % Reduction in Volume: Full Thickness Without Exposed Full Thickness Without Exposed N/A Classification: Support Structures Support Structures Medium Medium N/A Exudate Amount: Serosanguineous Serosanguineous N/A Exudate Type: red, brown red, brown N/A Exudate Color: Medium (34-66%) None Present (0%) N/A Granulation Amount: Pink  N/A N/A Granulation Quality: Medium (34-66%) None Present (0%) N/A Necrotic Amount: Fat Layer (Subcutaneous Tissue): Yes Fascia: No N/A Exposed Structures: Fascia: No Fat Layer (Subcutaneous Tissue): No Tendon: No Tendon: No Muscle: No Muscle: No Joint: No Joint: No Bone: No Bone: No None None N/A Epithelialization: Treatment Notes Electronic Signature(s) Signed: 08/07/2022 4:41:36 PM By: [name] Coria RN Entered By: [name] on 07/29/2022 14:36:10 -------------------------------------------------------------------------------- Multi-Disciplinary Care Plan Details Patient Name: Date of Service: [name] 07/29/2022 2:00 PM Medical Record Number: [id number] Patient Account Number: [id number] Date of Birth/Sex: Treating RN: [birth year] (75 y.o. [name] Primary Care Fantasha Daniele: [name] Other Clinician: Referring Akiva Brassfield: Treating Kiyona Mcnall/Extender: [name], [name] in Treatment: [address] [place] ([id number]) [id number] Page 6 of 10 Active Inactive Electronic Signature(s) Signed: 08/07/2022 4:41:36 PM By: [name] Coria RN Entered By: [name] on 07/29/2022 14:35:58 -------------------------------------------------------------------------------- Pain Assessment Details Patient Name: Date of Service: [name] 07/29/2022 2:00 PM Medical Record Number: [id number] Patient Account Number: [id number] Date of Birth/Sex: Treating RN: [birth year] (76 y.o. [name] Primary Care Hafiz Irion: [name] Other Clinician: Referring Hortencia Martire: Treating Lonnell Chaput/Extender: [name], [name] in Treatment: 75 Active Problems Location of Pain Severity and Description of Pain Patient Has Paino No Site Locations Pain Management and Medication Current Pain Management: Electronic Signature(s) Signed: 08/07/2022 4:41:36 PM By: [name] Coria RN Entered By: [name] on 07/29/2022  14:33:25 [name] ([id number]) [id number] Page 7 of 10 -------------------------------------------------------------------------------- Patient/Caregiver Education Details Patient Name: Date of Service: [name] 11/21/2023andnbsp2:00 PM Medical Record Number: [id number] Patient Account Number: [id number] Date of Birth/Gender: Treating RN: September 06, 1946 (76 y.o. [name] Primary Care Physician: [name] Other Clinician: Referring Physician: Treating Physician/Extender: [name] [name] in Treatment: 6 Education Assessment Education Provided To: Patient Education Topics Provided Wound/Skin Impairment: Methods: Explain/Verbal Responses: State content correctly Electronic Signature(s) Signed: 08/07/2022 4:41:36 PM By: [name] Coria RN Entered By: [name] on 08/05/2022 16:05:56 -------------------------------------------------------------------------------- Wound Assessment Details Patient Name: Date of Service: [name] 07/29/2022 2:00 PM Medical Record Number: [id number] Patient Account Number: [id number] Date of Birth/Sex: Treating RN: 07/14/1946 (76 y.o. [name] Primary Care Brailee Riede: [name] Other Clinician: Referring Yamilka Lopiccolo: Treating Leopold Smyers/Extender: [name], [name] in Treatment: 31 Wound Status Wound Number: 4 Primary Venous Leg Ulcer Etiology: Wound Location: Right, Lateral Lower Leg Wound Open Wounding Event: Gradually Appeared Status: Date Acquired: 10/27/2021 Comorbid Cataracts, Lymphedema, Sleep Apnea, Congestive Heart Failure, [name] Of Treatment: 39 History: Hypertension, Osteoarthritis, Neuropathy, Received Chemotherapy, Clustered Wound: No Received Radiation Photos [place], [name] ([id number]) [id number] Page 8 of 10 Wound Measurements Length: (cm) 2 Width: (cm) 1.6 Depth: (cm) 0.1 Area: (cm) 2.513 Volume: (cm)  0.251 % Reduction in Area: -113.3% % Reduction in Volume: -112.7% Epithelialization: None Tunneling:  No Undermining: No Wound Description Classification: Full Thickness Without Exposed Support Exudate Amount: Medium Exudate Type: Serosanguineous Exudate Color: red, brown Structures Foul Odor After Cleansing: No Slough/Fibrino Yes Wound Bed Granulation Amount: Medium (34-66%) Exposed Structure Granulation Quality: Pink Fascia Exposed: No Necrotic Amount: Medium (34-66%) Fat Layer (Subcutaneous Tissue) Exposed: Yes Necrotic Quality: Adherent Slough Tendon Exposed: No Muscle Exposed: No Joint Exposed: No Bone Exposed: No Electronic Signature(s) Signed: 08/07/2022 4:41:36 PM By: [name] Coria RN Entered By: [name] on 07/29/2022 14:34:47 -------------------------------------------------------------------------------- Wound Assessment Details Patient Name: Date of Service: [name] 07/29/2022 2:00 PM Medical Record Number: [id number] Patient Account Number: [id number] Date of Birth/Sex: Treating RN: 11/19/1945 (76 y.o. [name] Primary Care Aadhya Bustamante: [name] Other Clinician: Referring Burnett Lieber: Treating Nema Oatley/Extender: [name], [name] in Treatment: 60 Wound Status Wound Number: 6 Primary Lymphedema Etiology: Wound Location: Left, Medial Lower Leg Wound Open Wounding Event: Blister Status: Date Acquired: 07/09/2022 Comorbid Cataracts, Lymphedema, Sleep Apnea, Congestive Heart Failure, [name] Of Treatment: 1 History: Hypertension, Osteoarthritis, Neuropathy, Received Chemotherapy, Clustered Wound: No Received Radiation Photos [place], [name] ([id number]) [id number] Page 9 of 10 Wound Measurements Length: (cm) 10 Width: (cm) 9 Depth: (cm) 0.1 Area: (cm) 70.686 Volume: (cm) 7.069 % Reduction in Area: 18.2% % Reduction in Volume: 18.2% Epithelialization: None Tunneling: No Undermining:  No Wound Description Classification: Full Thickness Without Exposed Support Structures Exudate Amount: Medium Exudate Type: Serosanguineous Exudate Color: red, brown Foul Odor After Cleansing: No Slough/Fibrino No Wound Bed Granulation Amount: None Present (0%) Exposed Structure Necrotic Amount: None Present (0%) Fascia Exposed: No Fat Layer (Subcutaneous Tissue) Exposed: No Tendon Exposed: No Muscle Exposed: No Joint Exposed: No Bone Exposed: No Electronic Signature(s) Signed: 08/07/2022 4:41:36 PM By: [name] Coria RN Entered By: [name] on 07/29/2022 14:35:07 -------------------------------------------------------------------------------- Vitals Details Patient Name: Date of Service: [name] 07/29/2022 2:00 PM Medical Record Number: [id number] Patient Account Number: [id number] Date of Birth/Sex: Treating RN: April 27, 1946 (76 y.o. [name] Primary Care Zeb Rawl: [name] Other Clinician: Referring Malli Falotico: Treating Capri Raben/Extender: [name], [name] in Treatment: 38 Vital Signs Time Taken: 14:25 Temperature (F): 98.5 Height (in): 69 Pulse (bpm): 61 Weight (lbs): 244 Respiratory Rate (breaths/min): 18 Body Mass Index (BMI): 36 Blood Pressure (mmHg): 146/82 Reference Range: 80 - 120 mg / dl Electronic Signature(s) Signed: 08/07/2022 4:41:36 PM By: [name] Coria RN Entered By: [name] on 07/29/2022 14:25:46 [name] ([id number]) [id number] Page 10 of 10

## 2022-07-30 ENCOUNTER — Telehealth: Payer: Self-pay

## 2022-07-30 ENCOUNTER — Other Ambulatory Visit: Payer: Self-pay

## 2022-07-30 DIAGNOSIS — E042 Nontoxic multinodular goiter: Secondary | ICD-10-CM

## 2022-07-30 NOTE — Telephone Encounter (Signed)
Reached out to pts daughter to make her aware that the u/s pt had on 07/26/2022 did show two abnormal nodules that need to be aspirated for a bx. Per daughter, it is okay to go ahead and schedule biopsy. Made her aware that one we receive a date/time we will reach back out and make them aware of the day. Daughter agrees and understands plan.

## 2022-08-04 ENCOUNTER — Telehealth: Payer: Self-pay | Admitting: *Deleted

## 2022-08-04 NOTE — Telephone Encounter (Signed)
Called the pt. And got her voicemail and asked if she can do the thyroid biopsy this [facility] for 1 pm . I am awaiting her to call me back

## 2022-08-05 ENCOUNTER — Encounter: Payer: Medicare Other | Admitting: Physician Assistant

## 2022-08-05 DIAGNOSIS — I87332 Chronic venous hypertension (idiopathic) with ulcer and inflammation of left lower extremity: Secondary | ICD-10-CM | POA: Diagnosis not present

## 2022-08-05 NOTE — Progress Notes (Addendum)
YOSHIYE, KRAFT ([id number]) [id number] Page 1 of 14 Visit Report for 08/05/2022 Chief Complaint Document Details Patient Name: Date of Service: [name] 08/05/2022 2:00 PM Medical Record Number: [id number] Patient Account Number: [id number] Date of Birth/Sex: Treating RN: 08/01/1946 (76 y.o. [name] Primary Care Provider: [name] Other Clinician: Referring Provider: Treating Provider/Extender: Mayer Masker, Ngwe Weeks in Treatment: 25 Information Obtained from: Patient Chief Complaint Bilateral LE Ulcers Electronic Signature(s) Signed: 08/05/2022 2:09:52 PM By: Worthy Keeler PA-C Entered By: Worthy Keeler on 08/05/2022 14:09:52 -------------------------------------------------------------------------------- HPI Details Patient Name: Date of Service: [name] 08/05/2022 2:00 PM Medical Record Number: [id number] Patient Account Number: [id number] Date of Birth/Sex: Treating RN: 1945/09/24 (76 y.o. [name] Primary Care Provider: [name] Other Clinician: Referring Provider: Treating Provider/Extender: Mayer Masker, Ngwe Weeks in Treatment: 27 History of Present Illness HPI Description: 03/15/2020 upon evaluation today patient presents today for initial evaluation here in our clinic concerning a wound that is on the left posterior lower extremity. Unfortunately this has been giving the patient some discomfort at this point which she notes has been affected her pretty much daily. The patient does have a history of venous insufficiency, hypertension, congestive heart failure, and a history of breast cancer. Fortunately there does not appear to be evidence of active infection at this time which is great news. No fevers, chills, nausea, vomiting, or diarrhea. Wound is not extremely large but does have some slough covering the surface of the wound. This is going require sharp debridement  today. 03/15/2020 upon evaluation today patient appears to be doing fairly well in regard to her wound. She does have some slough noted on the surface of the wound currently but I do believe the Iodoflex has been beneficial over the past week. There is no signs of active infection at this time which is great news and overall very pleased with the progress. No fevers, chills, nausea, vomiting, or diarrhea. 04/02/2020 upon evaluation today patient appears to be doing a little better in regard to her wound size wise this is not tremendously smaller she does have some slough buildup on the surface of the wound. With that being said this is going require some sharp debridement today to clear away some of the necrotic debris. Fortunately there is no evidence of active infection at this time. No fevers, chills, nausea, vomiting, or diarrhea. 04/10/2020 on evaluation today patient appears to be doing well with regard to her ulcer which is measuring somewhat smaller today. She actually has more granulation tissue noted which is also good news is no need for sharp debridement today. Fortunately there is no evidence of infection either which is also excellent. No fevers, chills, nausea, vomiting, or diarrhea. [name], [name] ([id number]) [id number] Page 2 of 14 04/26/2020 on evaluation today patient's wound actually is showing signs of good improvement which is great news. There does not appear to be any evidence of active infection. I do believe she is tolerating the collagen at this point. 05/03/2020 upon evaluation today patient's wound actually showed signs of good granulation at this time there does not appear to be any evidence of active infection which is great news and overall very pleased with where things stand. With that being said I do believe that the patient is can require some sharp debridement today but fortunately nothing too significant. 05/11/20 on evaluation today  patient appears to be doing well in regard to her leg ulcer. She's been tolerating  the dressing changes without complication. Fortunately there is no signs of active infection at this time. Overall I'm very pleased with where things stand. 05/18/2020 upon evaluation today patient's wound is showing signs of improvement is very dry and I think the collagen for the most part just dried to the wound bed. I am going to clean this away with sharp debridement in order to get this under control in my opinion. 05/25/2020 upon evaluation today patient actually appears to be doing well in regard to her leg ulcer. In fact upon inspection it appears she could potentially be completely healed but that is not guaranteed based on what we are seeing. There does not appear to be any signs of active infection which is great news. 05/31/2020 on evaluation today patient appears to be doing well with regard to her wounds. She in fact appears to be almost completely healed on the left leg there is just a very small opening remaining point 1 06/08/2020 upon evaluation today patient appears to be doing excellent in regard to her leg ulcer on the left in fact it appears to be that she is completely healed as of today. I see no signs of active infection at this time which is great news. No fevers, chills, nausea, vomiting, or diarrhea. READMISSION 01/16/2021 Patient that we have had in this clinic with a wound on the left posterior calf in the summer 2021 extending into October. She was felt to have chronic venous insufficiency. Per the patient's description she was discharged with what sounds like external compression stockings although she could not afford the "$75 per leg". She therefore did not get anything. Apparently her wound that she has currently is been in existence since January. She has been followed at vein and vascular with Unna boots and I think calcium alginate. She had ABIs done on 06/25/2020 that showed an  noncompressible ABI on the right leg and the left leg but triphasic waveforms with good great toe pressures and a TBI of 0.90 on the right and 0.91 on the left Surprisingly the patient also had venous reflux studies that were really unremarkable. This included no evidence of DVT or SVT but there was no evidence of s s deep venous insufficiency or superficial venous insufficiency in the greater saphenous or short saphenous veins bilaterally. I would wonder about more central venous issues or perhaps this is all lymphedema 01/25/2021 upon evaluation today patient appears to be doing decently well and guard to her wound. The good news is the Iodoflex that is job she saw Dr. Dellia Nims last week for readmission and to be honest I think that this has done extremely well over that week. I do believe that she can tolerate the sharp debridement today which will be good. I think that cleaning off the wound will likely allow [place] to be able to use a different type of dressing I do not think the Iodoflex will even be necessary based on how clean the wound looks. Fortunately there is no sign of active infection at this time which is great news. No fevers, chills, nausea, vomiting, or diarrhea. 02/12/2021 upon evaluation today patient appears to be doing well with regard to her leg ulcer. We did switch to alginate when she came for nurse visit I think is doing much better for her. Fortunately there does not appear to be any signs of active infection at this time which is great news. No fevers, chills, nausea, vomiting, or diarrhea. 02/18/2021 upon evaluation today patient's wound  is actually showing signs of good improvement. I am very pleased with where things stand currently. I do not see any signs of active infection which is great news and overall I feel like she is making good progress. The patient likewise is happy that things are doing so well and that this is measuring somewhat smaller. 02/25/2021 upon evaluation  today patient actually appears to be doing quite well in regard to her wounds. She has been tolerating the dressing changes without complication. Fortunately there does not appear to be any signs of active infection which is great news and overall I am extremely pleased with where things stand. No fevers, chills, nausea, vomiting, or diarrhea. She does have a lot of edema I really do think she needs compression socks to be worn in order to prevent things from causing additional issues for her currently. Especially on the right leg she should be wearing compression socks all the time to be honest. 03/04/2021 upon evaluation today patient's leg ulcer actually appears to be doing pretty well which is great news. There does not appear to be any significant change but overall the appearance is better even though the size is not necessarily reflecting a great improvement. Fortunately there does not appear to be any signs of active infection. No fevers, chills, nausea, vomiting, or diarrhea. 03/12/2021 upon evaluation today patient appears to be doing well with regard to her wounds. She has been tolerating the dressing changes without complication. The good news is that the wound on her left lateral leg is doing much better and is showing signs of good improvement. Overall her swelling is controlled well as well. She does need some compression socks she plans to go Jefferey socks next week. 03/18/2021 on evaluation today patient's wound actually is showing signs of good improvement. She does have a little bit of slough this can require some sharp debridement today. 03/25/2021 upon evaluation today patient appears to be doing well with regard to her wound on the left lateral leg. She has been tolerating the dressing changes without complication. Fortunately there does not appear to be any signs of active infection at this time. No fever chills no 04/01/2021 upon evaluation today patient's wound is showing signs of  improvement and overall very pleased with where things stand at this point. There is no evidence of active infection which is great news as well and in general I think that she is making great progress. 04/09/2021 upon evaluation today patient appears to be doing well with regard to her ankle ulcer. She is making good progress currently which is great news. There does not appear to be any signs of infection also excellent news. In general I am extremely pleased with where things stand today. No fevers, chills, nausea, vomiting, or diarrhea. 04/23/2021 upon inspection today patient appears to be doing quite well with regard to her leg ulcer. She has been tolerating the dressing changes without complication. She is can require some sharp debridement today but overall seems to be doing excellent. 05/06/2021 upon evaluation today patient unfortunately appears to be doing poorly overall in regard to her swelling. She is actually significantly more swollen than last week despite the fact that her wound is doing better this is not a good trend. I think that she probably needs to see her cardiologist ASAP and I discussed that with her today. This includes the fact that honestly I think she probably is getting need an increase in her diuretic or something to try to clear  away some of the excess fluid that she is experiencing at the moment. She is not been doing her pumps even twice a day but again right now more concerned that if she were to do the pump she would fluid overload her lungs causing other issues as far as her breathing is concerned. Obviously I think she needs to contact them and move up the appointment for the 12th of something sooner 2 weeks is a bit too long to wait with how swollen she has. 05/14/2021 upon evaluation today patient's wound actually showing signs of being a little bit smaller compared to previous. She has been making good progress however. Fortunately there does not appear to be any  evidence of active infection which is great. Overall I am extremely pleased in that regard. Nonetheless I am still concerned that she is having quite a bit of issue as far as the swelling is concerned she has been placed on fluid restriction as well as an increase in the furosemide by her doctor. We will see how things progress. 05/21/2021 upon evaluation today patient appears to be doing well with regard to her wound in general. She has been tolerating the dressing changes without ADDILYNE, BACKS ([id number]) [id number] Page 3 of 14 complication. With that being said she has been using silver cell for a while and while this was showing signs of improvement it is now gotten to the point where this has slowed down quite significantly. I do believe that switching to a different dressing may be beneficial for her today. 05/28/2021 upon evaluation today patient actually appears to be doing quite well in regard to her wound. In fact there is really not any need for sharp debridement today based on what I see. The Hydrofera Blue is doing great and overall I think that she is managing quite nicely with the compression wrapping. Her edema is still down quite a bit with the wrapping in place. 06/11/2021 upon evaluation today patient appears to be doing well with regard to her leg ulcer. She has been tolerating the dressing changes without complication. Fortunately there does not appear to be any signs of active infection at this time which is great news. No fevers, chills, nausea, vomiting, or diarrhea. She is going require some sharp debridement today. 10/11; left leg ulcer much better looking and with improved surface area. She is using Hydrofera Blue under 3 layer compression. She does not have any stocking on the right leg which in itself has very significant nonpitting edema 06/25/2021 upon evaluation today patient appears to be doing well with regard to the wound on her left  leg. She has been tolerating the dressing changes without complication and overall I am extremely pleased with where things stand today. I do not see any evidence of infection which is great news. 07/02/2021 upon evaluation today patient's wound actually showing signs of excellent improvement and actually very pleased with where we stand today and the overall appearance. Fortunately there does not appear to be any signs of active infection. No fevers, chills, nausea, vomiting, or diarrhea. 07/09/2021 upon evaluation today patient appears to be doing better in regard to her wound this is measuring smaller and she is headed in the appropriate direction based on what I am seeing currently. I do not see any evidence of active infection systemically which is great news. 07/16/2021 upon evaluation today patient appears to be doing pretty well currently in regard to her leg ulcer. This is measuring smaller and looking much better  this is great news. Fortunately there does not appear to be any evidence of active infection systemically which is great news as well. 07/23/2021 upon evaluation today patient's wound is actually showing signs of good improvement this is definitely measuring smaller. I do not see any signs of infection which is great news and overall very pleased in that regard. Overall I think that she is doing well with the [facility]. 07/30/2020 upon evaluation today patient appears to be doing well in regard to her wound. She has been tolerating the dressing changes without complication think the Hydrofera Blue at this point is actually causing her to dry out a lot as far as the wound bed is concerned. Subsequently I think that we need to try to see what we can do to improve this. Fortunately there does not appear to be any evidence of active infection at this time which is great news. 08/06/2021 upon evaluation today patient appears to be doing well with regards to her wound. Is not measuring  significantly smaller initial inspection but upon closer inspection she has a lot of new skin growth across the central portion of the wound. I think will get very close to complete resolution. 08/13/2021 upon evaluation today patient appears to be doing well with regard to her wound. In fact this appears to be I believe healed although there is still some question as to whether it is completely so. I am concerned about the fact that to be honest she still has some evidence of dry skin that could be just trapping something underneath I still want to debride anything as I am afraid of causing some damage to the good skin for that reason I Georgina Peer probably monitor for 1 more week before completely closing everything out. 08/20/2021 upon evaluation today patient actually appears to be doing excellent. In fact she appears to be completely healed. This was the case last week as well we just wanted to make sure nothing reopened since she does not really have an ability to put on compression socks this is good to be something we need to make sure was well-healed. Nonetheless I think we have achieved that goal as of today. 12/27; this is a patient we discharged 2 weeks ago with wounds on her left lower leg laterally. She was supposed to get stockings and really did not get them. She developed blistering and reopening probably sometime late last week. She has 3 areas in the same area as previously. The mid calf dimensions on the left went from 39 to 51 cm today. She also has very significant edema on the right leg but as far as I am aware has not had wounds in this area 1/4; patient presents for follow-up. She has tolerated the compression wrap well. She has no issues or complaints today. 09/17/2021 upon evaluation today patient appears to be doing well with regard to her wounds. She has been tolerating the dressing changes without complication. Fortunately there does not appear to be any signs of active infection  at this time. No fevers, chills, nausea, vomiting, or diarrhea. 10/01/2021 upon evaluation today patient appears to be doing well with regard to her wound this is actually measuring better and looking smaller and I am very pleased in that regard. Fortunately I do not see any evidence of active infection locally nor systemically at this point. No fevers, chills, nausea, vomiting, or diarrhea. 10/08/2021 upon evaluation patient's wounds are actually showing some signs of improvement measuring a little bit smaller  and looking a little bit better. Overall I do not think there is any need for sharp debridement today which is good news. No fevers, chills, nausea, vomiting, or diarrhea. 10/14/2021 upon evaluation today patient appears to be doing well with regard to her wound. She has been tolerating the dressing changes without complication and overall I am extremely pleased with where things stand today. There is a little bit of film on the surface of the wounds but this was carefully cleaned away with just saline and gauze she really did not want me to do any sharp debridement today. 10/29/2021 upon evaluation today patient appears to be doing quite well in regard to her wounds. She is actually showing signs of excellent improvement which is great news and overall I feel like we are on the right track at this time. She does have a wound on both legs and she does need something compression wise when she heals for that reason we will get a go and see about ordering her bilateral juxta lite compression wraps. 11/05/2021 upon evaluation today patient appears to be doing well with regard to her wound. She has been tolerating the dressing changes without complication. Fortunately I do not see any evidence of active infection locally nor systemically at this time which is great news. No fevers, chills, nausea, vomiting, or diarrhea. 11/12/2021 upon evaluation today patient appears to be doing well with regard to her  wounds. She is doing excellent on the left on the right this is not doing quite as well there is some slough and biofilm buildup. 3/14; patient presents for follow-up. She has no issues or complaints today. She has tolerated the compression wraps well. 11/26/2021 upon evaluation today patient appears to be doing well with regard to her wounds. Both are showing signs of good improvement which is great news. I do not see any evidence of active infection and overall think she is healing quite nicely. 12/03/2021 upon evaluation today patient appears to be doing well with regard to her wounds. Both are showing signs of excellent improvement I am very pleased with where things stand and I think that she is making good progress here. Fortunately I do not see any signs of active infection locally or systemically which is great news. No fevers, chills, nausea, vomiting, or diarrhea. 12-10-2021 upon evaluation today patient's wounds actually appear to be doing awesome. I am extremely pleased with where we stand I think she is making excellent progress. I do not see any signs of active infection locally or systemically at this time. 12-17-2021 upon evaluation today patient's wounds are actually showing signs of good improvement. Fortunately I do not see any evidence of active infection locally nor systemically which is great news. Overall I do not believe that she is showing any signs of significant worsening which is good news. With that being said I do believe that she has a little bit of film on the surface of the wound I was actually able to clear this away with saline and gauze no sharp debridement was necessary. [name], [name] ([id number]) [id number] Page 4 of 14 12-24-2021 upon evaluation today patient appears to be doing well with regard to her wounds. I do not see any signs of active infection currently which is great news and overall I think that we are on the right track  here. No fevers, chills, nausea, vomiting, or diarrhea. 12-31-2021 upon evaluation today patient appears to be doing well with regard to her wounds. Both are showing signs  of improvement the wound on the right leg is going require some sharp debridement on the left seems to be doing quite well. 01-07-2022 upon evaluation today patient appears to be doing well currently in regard to her wounds. She is actually making good progress bilaterally. Both of them require little bit of sharp debridement but not too much which is great news. Upon inspection patient's wound bed actually showed signs of good granulation and epithelization on both legs. She still is having significant issues with the right leg compared to the left although I do feel like both are actually showing signs of pretty good improvement. 01-21-2022 upon evaluation today patient's wound is actually showing signs of excellent improvement I am very pleased with where we stand currently. I do not see any signs of active infection locally or systemically which is great news. No fevers, chills, nausea, vomiting, or diarrhea. 01-28-2022 upon evaluation today patient appears to be doing well with regard to her wound on the left leg this is pretty much just about healed which is great news. Unfortunately in regard to the wound on her right leg this is not doing nearly as well in fact I think we probably need to see about a culture today that was discussed with her. 02-04-2022 upon evaluation today patient appears to be doing well with regard to her left leg which in fact might actually be completely healed I am not 100% sure. Nonetheless this is shown signs of excellent improvement which is great news. With that being said in regards to the right leg there is some slough and film buildup on the surface of the wound although she is feeling much better than last week this does appear to be doing much better than it was last week. Fortunately there does  not appear to be any signs of active infection locally or systemically at this time. 02-11-2022 upon evaluation today patient's wounds appear to be doing decently well. In fact the left leg is healed although there is brand-new skin were definitely given still need to wrap her for the time being. On the right leg this is showing signs of some epithelial growth in the middle of the wound it still measures the same because there is still a larger area with speckled openings but again as far as the entire area being open this is significantly improved which is great news. I am very pleased. 6/13; left leg remains healed and she has a juxta light to apply to it today. There is some concern about whether she is going to be able to do this at home. She lives with a daughter although she has her own disability. On the right the wound looks smaller to me nice epithelialization good edema control. 02-25-2022 upon evaluation today patient appears to be doing well with regard to her legs the left leg is still healed the right leg is doing much better. I think we are on the right track here. With that being said unfortunately she continues to have significant issues with some other problems and she actually tells me on Saturday she was having lunch and went inside to sit down when she tells me she had all of a sudden a sharp sensation that went from her hand through her arm into her leg and her neck and she tells me that she could not see anything for the next hour it was completely black. Slowly after this her vision started to return and has been normal since. It sounds  to me as if she may have had a TIA or mini stroke at this point. Nonetheless I think that she needs to have this checked out ASAP. Her primary care provider is Dr. Clide Deutscher at [facility]. We will get a try to get in touch with him. 03-10-2022 upon evaluation today patient's wound actually showed signs of good improvement. She seems to be making  excellent progress here. I do not see any evidence of active infection locally or systemically which is great news. No fevers, chills, nausea, vomiting, or diarrhea. She was in the hospital since have seen her last she ended up finding out that she was having syncopal episodes. That is what was going on with the blacking out and losing vision. Subsequently they adjusted some of her medication she seems to be doing better. 03-18-2022 upon evaluation today patient's wound is actually showing signs of good granulation and epithelization at this point. Fortunately I do not see any signs of active infection locally or systemically which is great news. 03-25-2022 any evidence of active infection locally or systemically which is great news. Any evidence of active infection which is great news and overall the wound is looking better. Upon evaluation today patient appears to be doing well with regard to her leg ulcer. She has been tolerating the dressing changes without complication. Fortunately I do not see 7/25; the patient's wound is on the right lateral lower extremity using silver alginate 3 layer compression. She has chronic venous insufficiency and lymphedema. We are using a juxta lite on the left leg which does not have any open wounds but she is leaving this on all week it seems that there just is not a way to get this changed at home 04-08-2022 upon evaluation today patient appears to be doing well currently in regard to her wound. She has been tolerating the dressing changes without complication. Fortunately I do not see any evidence of active infection locally or systemically which is great news. No fever or chills noted her left leg has reopened at this point. 04-15-2022 upon evaluation patient's wounds actually are showing signs of improvement which is great news. Little by little I do believe her making progress here. In regard to the right leg this is looking better though the measurement does not  speak to it there is a lot of new skin growth. On the left leg this is significantly smaller compared to last week most of the blistered area has completely closed. 04-22-2022 upon evaluation today patient appears to be doing excellent in regard to her wounds both are showing signs of improvement. Her knees and above are still extremely swollen the legs were rewrapped them are down significantly. With that being said I do not think that we will get a be able to keep them that way once were not rapid noted that she really has no way to be able to put juxta lites on and off and nobody to help her with that. That is the biggest concern that we have here at this point. Fortunately I do not think that there is any evidence of infection which is great news. 04-29-2022 upon evaluation today patient appears to be doing well with regard to her wounds. She has been tolerating the dressing changes without complication. Fortunately there does not appear to be any evidence of infection locally or systemically which is great news and overall I am extremely pleased in that regard. Left leg is healed the right leg is very close we discussed  today the possibility of her granddaughter helping with putting on and off compression wraps for her. 05-06-2022 upon evaluation today patient appears to be doing well currently in regard to her left leg which is showing signs of being completely healed. With regard to her right leg she still has an open area though this is doing much better there is a lot less drainage than what we previously noted. Fortunately I see no evidence of active infection locally or systemically at this time which is great news. 05-20-2022 upon evaluation today patient appears to be doing excellent in regard to her leg. The left leg is still healed the right leg is much better. Fortunately I see no signs of infection locally or systemically at this time which is great news. No fevers, chills, nausea,  vomiting, or diarrhea. 05-27-2022 upon evaluation today patient appears to be doing excellent in regard to her wound this is actually measuring smaller and looking much better. Fortunately I see no signs of active infection locally or systemically at this time. 06-03-2022 upon evaluation patient's wound actually is measuring smaller and looking much better. She still has a lot of discomfort but fortunately nothing to significant at this point which is great news. No fevers, chills, nausea, vomiting, or diarrhea. I think we are making some pretty good progress here. 06-10-2022 upon evaluation today patient appears to be doing well currently in regard to her wound which is actually measuring somewhat smaller. This is actually 2 separate wounds that are clustered and this is making it appear little bit larger than what it is but nonetheless we are seeing some definite improvement. Fortunately I do not see any signs of active infection at this time. No fevers, chills, nausea, vomiting, or diarrhea. 06-17-2022 upon evaluation today patient appears to be doing well currently in regard to her wound although she was in the hospital since she was last here. [name], [name] ([id number]) [id number] Page 5 of 14 That was from 06-12-2022 through 06-16-2022 which was yesterday. This was due to a CHF exacerbation. With that being said the patient has noted in her record review acute on chronic congestive heart failure with hypertensive emergency noted. She also has stage IIIa chronic kidney disease noted. With that being said I do think her wound is much better because I got a lot of fluid off to try to diurese her to a degree but her kidneys would not hold for that they put her on furosemide 40 mg and that is helping to get the fluid off she is much better but still has a lot of fluid buildup. 06-24-2022 upon evaluation today patient appears to be doing well currently in regard to her wound  is not significantly smaller but it is improved overall. Fortunately I do not see any signs of active infection locally nor systemically at this time. 07-01-2022 upon evaluation today patient appears to be doing okay in regard to her wound I do not believe the silver alginate is helping quite as much as I would like to see. I would actually recommend we switch back over to the [facility] at this point which I think may do better. Fortunately I do not see any signs of infection which is good news were going to switch to Tubigrip on the left leg on the right leg we will get a continue with the compression wrap. 07-08-2022 upon evaluation today patient unfortunately is not doing nearly as well in regard to her left leg. We had to put her  back into a 4-layer compression wrap today due to a significant blister that appeared. I do believe that we need to try to get this to reabsorb we will see how things do over the next week. With that being said the right leg is doing well the 4-layer compression wrap and switch to the [facility] seems to be very beneficial I am pleased with what we are seeing in that regard. 11/7; patient's wounds are on the right posterior lateral leg actually measures smaller and look healthy we have been using Hydrofera Blue under 3 layer compression. Last week she had a large blister that was left intact covered with alginate but the leg was wrapped in 3 layer. Today the left leg had unroofed but the skin still looks reasonably healthy. Nevertheless she is still going to require the left leg to be wrapped 07-22-2022 upon evaluation today patient unfortunately is extremely swollen in regard to her bilateral lower extremities despite the fact that she is currently being compression wrap a 4-layer compression wrap bilaterally. This is unfortunate and the fact that I believe this is not just a lower extremity swelling issue but to be honest this is a much more global issue with  edema in regard to her congestive heart failure. Her BNP has been up in the past month and I am not sure where it is as it has been rechecked she sees the heart failure clinic on Thursday in 2 days. With that being said she does tell me that she is having trouble laying down she tells me that she is having trouble walking as well. With all that being said I advised that she probably needs to go to the hospital for further evaluation. 07-29-2022 upon evaluation today patient appears to be doing well currently in regard to her wound on the right leg I am very pleased the left leg is also doing better though it is weeping quite a bit. I think we need to do something to Help calm this down a bit. She is in agreement with that plan. 08-05-2022 upon evaluation today patient's wounds are showing signs of improvement albeit slowly. Fortunately I do not see any evidence of infection locally nor systemically at this time which is great news. No fevers, chills, nausea, vomiting, or diarrhea. Electronic Signature(s) Signed: 08/05/2022 2:40:26 PM By: Worthy Keeler PA-C Entered By: Worthy Keeler on 08/05/2022 14:40:26 -------------------------------------------------------------------------------- Physical Exam Details Patient Name: Date of Service: [name] 08/05/2022 2:00 PM Medical Record Number: [id number] Patient Account Number: [id number] Date of Birth/Sex: Treating RN: Jul 15, 1946 (76 y.o. [name] Primary Care Provider: [name] Other Clinician: Referring Provider: Treating Provider/Extender: Mayer Masker, Ngwe Weeks in Treatment: 80 Constitutional Obese and well-hydrated in no acute distress. Respiratory normal breathing without difficulty. Psychiatric this patient is able to make decisions and demonstrates good insight into disease process. Alert and Oriented x 3. pleasant and cooperative. Notes Upon inspection patient's wound bed actually showed signs of good  granulation epithelization at this point. Overall I do believe that we are headed in the right direction and I do think that the patient is making progress here. I do want to make sure that she continues to try to elevate her legs much as possible and fortunately she has a lot of swelling and there is only so much that we can do to count to keep this under control but we are doing everything we can do in that regard. Electronic Signature(s) Signed:  08/05/2022 2:40:49 PM By: Worthy Keeler PA-C Entered By: Worthy Keeler on 08/05/2022 14:40:49 [name] ([id number]) [id number] Page 6 of 14 -------------------------------------------------------------------------------- Physician Orders Details Patient Name: Date of Service: [name] 08/05/2022 2:00 PM Medical Record Number: [id number] Patient Account Number: [id number] Date of Birth/Sex: Treating RN: 1946/04/27 (76 y.o. [name] Primary Care Provider: [name] Other Clinician: Referring Provider: Treating Provider/Extender: Mayer Masker, Ngwe Weeks in Treatment: 75 Verbal / Phone Orders: No Diagnosis Coding ICD-10 Coding Code Description I89.0 Lymphedema, not elsewhere classified I87.332 Chronic venous hypertension (idiopathic) with ulcer and inflammation of left lower extremity L97.812 Non-pressure chronic ulcer of other part of right lower leg with fat layer exposed Follow-up Appointments Return Appointment in 1 week. Nurse Visit as needed Bathing/ Shower/ Hygiene May shower with wound dressing protected with water repellent cover or cast protector. No tub bath. Anesthetic (Use 'Patient Medications' Section for Anesthetic Order Entry) Lidocaine applied to wound bed Edema Control - Lymphedema / Segmental Compressive Device / Other Bilateral Lower Extremities Optional: One layer of unna paste to top of compression wrap (to act as an anchor). Elevate, Exercise Daily  and A void Standing for Long Periods of Time. Elevate legs to the level of the heart and pump ankles as often as possible Elevate leg(s) parallel to the floor when sitting. Compression Pump: Use compression pump on left lower extremity for 60 minutes, twice daily. - You may pump over wraps Compression Pump: Use compression pump on right lower extremity for 60 minutes, twice daily. - You may pump over wraps Wound Treatment Wound #4 - Lower Leg Wound Laterality: Right, Lateral Cleanser: Soap and Water 1 x Per Week/30 Days Discharge Instructions: Gently cleanse wound with antibacterial soap, rinse and pat dry prior to dressing wounds Cleanser: Wound Cleanser 1 x Per Week/30 Days Discharge Instructions: Wash your hands with soap and water. Remove old dressing, discard into plastic bag and place into trash. Cleanse the wound with Wound Cleanser prior to applying a clean dressing using gauze sponges, not tissues or cotton balls. Do not scrub or use excessive force. Pat dry using gauze sponges, not tissue or cotton balls. Prim Dressing: Silvercel Small 2x2 (in/in) 1 x Per Week/30 Days ary Discharge Instructions: Apply Silvercel Small 2x2 (in/in) as instructed Compression Wrap: 3-LAYER WRAP - Profore Lite LF 3 Multilayer Compression Bandaging System 1 x Per Week/30 Days Discharge Instructions: Apply 3 multi-layer wrap as prescribed. Wound #6 - Lower Leg Wound Laterality: Left, Medial Cleanser: Soap and Water 1 x Per Week/30 Days Discharge Instructions: Gently cleanse wound with antibacterial soap, rinse and pat dry prior to dressing wounds Cleanser: Wound Cleanser 1 x Per Week/30 Days Discharge Instructions: Wash your hands with soap and water. Remove old dressing, discard into plastic bag and place into trash. Cleanse the wound with Wound Cleanser prior to applying a clean dressing using gauze sponges, not tissues or cotton balls. Do not scrub or use excessive force. Pat dry using gauze sponges, not  tissue or cotton balls. Prim Dressing: Silvercel 4 1/4x 4 1/4 (in/in) ary 1 x Per Week/30 Days [name], [name] ([id number]) [id number] Page 7 of 14 Discharge Instructions: Apply Silvercel 4 1/4x 4 1/4 (in/in) as instructed Secondary Dressing: ABD Pad 5x9 (in/in) 1 x Per Week/30 Days Discharge Instructions: Cover with ABD pad Compression Wrap: 3-LAYER WRAP - Profore Lite LF 3 Multilayer Compression Bandaging System 1 x Per Week/30 Days Discharge Instructions: Apply 3 multi-layer wrap as prescribed. Electronic Signature(s) Signed: 08/05/2022  4:00:23 PM By: Carlene Coria RN Signed: 08/05/2022 4:53:03 PM By: Worthy Keeler PA-C Entered By: Carlene Coria on 08/05/2022 14:27:17 -------------------------------------------------------------------------------- Problem List Details Patient Name: Date of Service: [name] 08/05/2022 2:00 PM Medical Record Number: [id number] Patient Account Number: [id number] Date of Birth/Sex: Treating RN: April 09, 1946 (76 y.o. [name] Primary Care Provider: [name] Other Clinician: Referring Provider: Treating Provider/Extender: Mayer Masker, Ngwe Weeks in Treatment: 53 Active Problems ICD-10 Encounter Code Description Active Date MDM Diagnosis I89.0 Lymphedema, not elsewhere classified 01/16/2021 No Yes I87.332 Chronic venous hypertension (idiopathic) with ulcer and inflammation of left 01/16/2021 No Yes lower extremity L97.812 Non-pressure chronic ulcer of other part of right lower leg with fat layer 01/07/2022 No Yes exposed Inactive Problems ICD-10 Code Description Active Date Inactive Date L97.822 Non-pressure chronic ulcer of other part of left lower leg with fat layer exposed 01/16/2021 01/16/2021 Resolved Problems Electronic Signature(s) Signed: 08/05/2022 2:09:48 PM By: Worthy Keeler PA-C Entered By: Worthy Keeler on 08/05/2022 14:09:48 [name] ([id number])  [id number] Page 8 of 14 -------------------------------------------------------------------------------- Progress Note Details Patient Name: Date of Service: [name] 08/05/2022 2:00 PM Medical Record Number: [id number] Patient Account Number: [id number] Date of Birth/Sex: Treating RN: 05/30/1946 (76 y.o. [name] Primary Care Provider: [name] Other Clinician: Referring Provider: Treating Provider/Extender: Mayer Masker, Ngwe Weeks in Treatment: 52 Subjective Chief Complaint Information obtained from Patient Bilateral LE Ulcers History of Present Illness (HPI) 03/15/2020 upon evaluation today patient presents today for initial evaluation here in our clinic concerning a wound that is on the left posterior lower extremity. Unfortunately this has been giving the patient some discomfort at this point which she notes has been affected her pretty much daily. The patient does have a history of venous insufficiency, hypertension, congestive heart failure, and a history of breast cancer. Fortunately there does not appear to be evidence of active infection at this time which is great news. No fevers, chills, nausea, vomiting, or diarrhea. Wound is not extremely large but does have some slough covering the surface of the wound. This is going require sharp debridement today. 03/15/2020 upon evaluation today patient appears to be doing fairly well in regard to her wound. She does have some slough noted on the surface of the wound currently but I do believe the Iodoflex has been beneficial over the past week. There is no signs of active infection at this time which is great news and overall very pleased with the progress. No fevers, chills, nausea, vomiting, or diarrhea. 04/02/2020 upon evaluation today patient appears to be doing a little better in regard to her wound size wise this is not tremendously smaller she does have some slough buildup on the  surface of the wound. With that being said this is going require some sharp debridement today to clear away some of the necrotic debris. Fortunately there is no evidence of active infection at this time. No fevers, chills, nausea, vomiting, or diarrhea. 04/10/2020 on evaluation today patient appears to be doing well with regard to her ulcer which is measuring somewhat smaller today. She actually has more granulation tissue noted which is also good news is no need for sharp debridement today. Fortunately there is no evidence of infection either which is also excellent. No fevers, chills, nausea, vomiting, or diarrhea. 04/26/2020 on evaluation today patient's wound actually is showing signs of good improvement which is great news. There does not appear to be any evidence of active  infection. I do believe she is tolerating the collagen at this point. 05/03/2020 upon evaluation today patient's wound actually showed signs of good granulation at this time there does not appear to be any evidence of active infection which is great news and overall very pleased with where things stand. With that being said I do believe that the patient is can require some sharp debridement today but fortunately nothing too significant. 05/11/20 on evaluation today patient appears to be doing well in regard to her leg ulcer. She's been tolerating the dressing changes without complication. Fortunately there is no signs of active infection at this time. Overall I'm very pleased with where things stand. 05/18/2020 upon evaluation today patient's wound is showing signs of improvement is very dry and I think the collagen for the most part just dried to the wound bed. I am going to clean this away with sharp debridement in order to get this under control in my opinion. 05/25/2020 upon evaluation today patient actually appears to be doing well in regard to her leg ulcer. In fact upon inspection it appears she could potentially be completely  healed but that is not guaranteed based on what we are seeing. There does not appear to be any signs of active infection which is great news. 05/31/2020 on evaluation today patient appears to be doing well with regard to her wounds. She in fact appears to be almost completely healed on the left leg there is just a very small opening remaining point 1 06/08/2020 upon evaluation today patient appears to be doing excellent in regard to her leg ulcer on the left in fact it appears to be that she is completely healed as of today. I see no signs of active infection at this time which is great news. No fevers, chills, nausea, vomiting, or diarrhea. READMISSION 01/16/2021 Patient that we have had in this clinic with a wound on the left posterior calf in the summer 2021 extending into October. She was felt to have chronic venous insufficiency. Per the patient's description she was discharged with what sounds like external compression stockings although she could not afford the "$75 per leg". She therefore did not get anything. Apparently her wound that she has currently is been in existence since January. She has been followed at vein and vascular with Unna boots and I think calcium alginate. She had ABIs done on 06/25/2020 that showed an noncompressible ABI on the right leg and the left leg but triphasic waveforms with good great toe pressures and a TBI of 0.90 on the right and 0.91 on the left Surprisingly the patient also had venous reflux studies that were really unremarkable. This included no evidence of DVT or SVT but there was no evidence of s s deep venous insufficiency or superficial venous insufficiency in the greater saphenous or short saphenous veins bilaterally. I would wonder about more central venous issues or perhaps this is all lymphedema 01/25/2021 upon evaluation today patient appears to be doing decently well and guard to her wound. The good news is the Iodoflex that is job she saw Dr. Dellia Nims  last week for readmission and to be honest I think that this has done extremely well over that week. I do believe that she can tolerate the sharp debridement today which will be good. I think that cleaning off the wound will likely allow [place] to be able to use a different type of dressing I do not think the Iodoflex will even be necessary based on how clean  the wound looks. Fortunately there is no sign of active infection at this time which is great news. No fevers, chills, nausea, vomiting, or diarrhea. [name], [name] ([id number]) [id number] Page 9 of 14 02/12/2021 upon evaluation today patient appears to be doing well with regard to her leg ulcer. We did switch to alginate when she came for nurse visit I think is doing much better for her. Fortunately there does not appear to be any signs of active infection at this time which is great news. No fevers, chills, nausea, vomiting, or diarrhea. 02/18/2021 upon evaluation today patient's wound is actually showing signs of good improvement. I am very pleased with where things stand currently. I do not see any signs of active infection which is great news and overall I feel like she is making good progress. The patient likewise is happy that things are doing so well and that this is measuring somewhat smaller. 02/25/2021 upon evaluation today patient actually appears to be doing quite well in regard to her wounds. She has been tolerating the dressing changes without complication. Fortunately there does not appear to be any signs of active infection which is great news and overall I am extremely pleased with where things stand. No fevers, chills, nausea, vomiting, or diarrhea. She does have a lot of edema I really do think she needs compression socks to be worn in order to prevent things from causing additional issues for her currently. Especially on the right leg she should be wearing compression socks all the time to be  honest. 03/04/2021 upon evaluation today patient's leg ulcer actually appears to be doing pretty well which is great news. There does not appear to be any significant change but overall the appearance is better even though the size is not necessarily reflecting a great improvement. Fortunately there does not appear to be any signs of active infection. No fevers, chills, nausea, vomiting, or diarrhea. 03/12/2021 upon evaluation today patient appears to be doing well with regard to her wounds. She has been tolerating the dressing changes without complication. The good news is that the wound on her left lateral leg is doing much better and is showing signs of good improvement. Overall her swelling is controlled well as well. She does need some compression socks she plans to go Jefferey socks next week. 03/18/2021 on evaluation today patient's wound actually is showing signs of good improvement. She does have a little bit of slough this can require some sharp debridement today. 03/25/2021 upon evaluation today patient appears to be doing well with regard to her wound on the left lateral leg. She has been tolerating the dressing changes without complication. Fortunately there does not appear to be any signs of active infection at this time. No fever chills no 04/01/2021 upon evaluation today patient's wound is showing signs of improvement and overall very pleased with where things stand at this point. There is no evidence of active infection which is great news as well and in general I think that she is making great progress. 04/09/2021 upon evaluation today patient appears to be doing well with regard to her ankle ulcer. She is making good progress currently which is great news. There does not appear to be any signs of infection also excellent news. In general I am extremely pleased with where things stand today. No fevers, chills, nausea, vomiting, or diarrhea. 04/23/2021 upon inspection today patient appears to  be doing quite well with regard to her leg ulcer. She has been tolerating the dressing changes  without complication. She is can require some sharp debridement today but overall seems to be doing excellent. 05/06/2021 upon evaluation today patient unfortunately appears to be doing poorly overall in regard to her swelling. She is actually significantly more swollen than last week despite the fact that her wound is doing better this is not a good trend. I think that she probably needs to see her cardiologist ASAP and I discussed that with her today. This includes the fact that honestly I think she probably is getting need an increase in her diuretic or something to try to clear away some of the excess fluid that she is experiencing at the moment. She is not been doing her pumps even twice a day but again right now more concerned that if she were to do the pump she would fluid overload her lungs causing other issues as far as her breathing is concerned. Obviously I think she needs to contact them and move up the appointment for the 12th of something sooner 2 weeks is a bit too long to wait with how swollen she has. 05/14/2021 upon evaluation today patient's wound actually showing signs of being a little bit smaller compared to previous. She has been making good progress however. Fortunately there does not appear to be any evidence of active infection which is great. Overall I am extremely pleased in that regard. Nonetheless I am still concerned that she is having quite a bit of issue as far as the swelling is concerned she has been placed on fluid restriction as well as an increase in the furosemide by her doctor. We will see how things progress. 05/21/2021 upon evaluation today patient appears to be doing well with regard to her wound in general. She has been tolerating the dressing changes without complication. With that being said she has been using silver cell for a while and while this was showing signs of  improvement it is now gotten to the point where this has slowed down quite significantly. I do believe that switching to a different dressing may be beneficial for her today. 05/28/2021 upon evaluation today patient actually appears to be doing quite well in regard to her wound. In fact there is really not any need for sharp debridement today based on what I see. The Hydrofera Blue is doing great and overall I think that she is managing quite nicely with the compression wrapping. Her edema is still down quite a bit with the wrapping in place. 06/11/2021 upon evaluation today patient appears to be doing well with regard to her leg ulcer. She has been tolerating the dressing changes without complication. Fortunately there does not appear to be any signs of active infection at this time which is great news. No fevers, chills, nausea, vomiting, or diarrhea. She is going require some sharp debridement today. 10/11; left leg ulcer much better looking and with improved surface area. She is using Hydrofera Blue under 3 layer compression. She does not have any stocking on the right leg which in itself has very significant nonpitting edema 06/25/2021 upon evaluation today patient appears to be doing well with regard to the wound on her left leg. She has been tolerating the dressing changes without complication and overall I am extremely pleased with where things stand today. I do not see any evidence of infection which is great news. 07/02/2021 upon evaluation today patient's wound actually showing signs of excellent improvement and actually very pleased with where we stand today and the overall appearance. Fortunately there  does not appear to be any signs of active infection. No fevers, chills, nausea, vomiting, or diarrhea. 07/09/2021 upon evaluation today patient appears to be doing better in regard to her wound this is measuring smaller and she is headed in the appropriate direction based on what I am seeing  currently. I do not see any evidence of active infection systemically which is great news. 07/16/2021 upon evaluation today patient appears to be doing pretty well currently in regard to her leg ulcer. This is measuring smaller and looking much better this is great news. Fortunately there does not appear to be any evidence of active infection systemically which is great news as well. 07/23/2021 upon evaluation today patient's wound is actually showing signs of good improvement this is definitely measuring smaller. I do not see any signs of infection which is great news and overall very pleased in that regard. Overall I think that she is doing well with the [facility]. 07/30/2020 upon evaluation today patient appears to be doing well in regard to her wound. She has been tolerating the dressing changes without complication think the Hydrofera Blue at this point is actually causing her to dry out a lot as far as the wound bed is concerned. Subsequently I think that we need to try to see what we can do to improve this. Fortunately there does not appear to be any evidence of active infection at this time which is great news. 08/06/2021 upon evaluation today patient appears to be doing well with regards to her wound. Is not measuring significantly smaller initial inspection but upon closer inspection she has a lot of new skin growth across the central portion of the wound. I think will get very close to complete resolution. 08/13/2021 upon evaluation today patient appears to be doing well with regard to her wound. In fact this appears to be I believe healed although there is still some question as to whether it is completely so. I am concerned about the fact that to be honest she still has some evidence of dry skin that could be just trapping something underneath I still want to debride anything as I am afraid of causing some damage to the good skin for that reason I Georgina Peer probably monitor for 1 more week  before completely closing everything out. 08/20/2021 upon evaluation today patient actually appears to be doing excellent. In fact she appears to be completely healed. This was the case last week as [name], [name] ([id number]) [id number] Page 10 of 14 well we just wanted to make sure nothing reopened since she does not really have an ability to put on compression socks this is good to be something we need to make sure was well-healed. Nonetheless I think we have achieved that goal as of today. 12/27; this is a patient we discharged 2 weeks ago with wounds on her left lower leg laterally. She was supposed to get stockings and really did not get them. She developed blistering and reopening probably sometime late last week. She has 3 areas in the same area as previously. The mid calf dimensions on the left went from 39 to 51 cm today. She also has very significant edema on the right leg but as far as I am aware has not had wounds in this area 1/4; patient presents for follow-up. She has tolerated the compression wrap well. She has no issues or complaints today. 09/17/2021 upon evaluation today patient appears to be doing well with regard to her wounds. She has been  tolerating the dressing changes without complication. Fortunately there does not appear to be any signs of active infection at this time. No fevers, chills, nausea, vomiting, or diarrhea. 10/01/2021 upon evaluation today patient appears to be doing well with regard to her wound this is actually measuring better and looking smaller and I am very pleased in that regard. Fortunately I do not see any evidence of active infection locally nor systemically at this point. No fevers, chills, nausea, vomiting, or diarrhea. 10/08/2021 upon evaluation patient's wounds are actually showing some signs of improvement measuring a little bit smaller and looking a little bit better. Overall I do not think there is any need for sharp  debridement today which is good news. No fevers, chills, nausea, vomiting, or diarrhea. 10/14/2021 upon evaluation today patient appears to be doing well with regard to her wound. She has been tolerating the dressing changes without complication and overall I am extremely pleased with where things stand today. There is a little bit of film on the surface of the wounds but this was carefully cleaned away with just saline and gauze she really did not want me to do any sharp debridement today. 10/29/2021 upon evaluation today patient appears to be doing quite well in regard to her wounds. She is actually showing signs of excellent improvement which is great news and overall I feel like we are on the right track at this time. She does have a wound on both legs and she does need something compression wise when she heals for that reason we will get a go and see about ordering her bilateral juxta lite compression wraps. 11/05/2021 upon evaluation today patient appears to be doing well with regard to her wound. She has been tolerating the dressing changes without complication. Fortunately I do not see any evidence of active infection locally nor systemically at this time which is great news. No fevers, chills, nausea, vomiting, or diarrhea. 11/12/2021 upon evaluation today patient appears to be doing well with regard to her wounds. She is doing excellent on the left on the right this is not doing quite as well there is some slough and biofilm buildup. 3/14; patient presents for follow-up. She has no issues or complaints today. She has tolerated the compression wraps well. 11/26/2021 upon evaluation today patient appears to be doing well with regard to her wounds. Both are showing signs of good improvement which is great news. I do not see any evidence of active infection and overall think she is healing quite nicely. 12/03/2021 upon evaluation today patient appears to be doing well with regard to her wounds. Both are  showing signs of excellent improvement I am very pleased with where things stand and I think that she is making good progress here. Fortunately I do not see any signs of active infection locally or systemically which is great news. No fevers, chills, nausea, vomiting, or diarrhea. 12-10-2021 upon evaluation today patient's wounds actually appear to be doing awesome. I am extremely pleased with where we stand I think she is making excellent progress. I do not see any signs of active infection locally or systemically at this time. 12-17-2021 upon evaluation today patient's wounds are actually showing signs of good improvement. Fortunately I do not see any evidence of active infection locally nor systemically which is great news. Overall I do not believe that she is showing any signs of significant worsening which is good news. With that being said I do believe that she has a little bit of  film on the surface of the wound I was actually able to clear this away with saline and gauze no sharp debridement was necessary. 12-24-2021 upon evaluation today patient appears to be doing well with regard to her wounds. I do not see any signs of active infection currently which is great news and overall I think that we are on the right track here. No fevers, chills, nausea, vomiting, or diarrhea. 12-31-2021 upon evaluation today patient appears to be doing well with regard to her wounds. Both are showing signs of improvement the wound on the right leg is going require some sharp debridement on the left seems to be doing quite well. 01-07-2022 upon evaluation today patient appears to be doing well currently in regard to her wounds. She is actually making good progress bilaterally. Both of them require little bit of sharp debridement but not too much which is great news. Upon inspection patient's wound bed actually showed signs of good granulation and epithelization on both legs. She still is having significant issues with  the right leg compared to the left although I do feel like both are actually showing signs of pretty good improvement. 01-21-2022 upon evaluation today patient's wound is actually showing signs of excellent improvement I am very pleased with where we stand currently. I do not see any signs of active infection locally or systemically which is great news. No fevers, chills, nausea, vomiting, or diarrhea. 01-28-2022 upon evaluation today patient appears to be doing well with regard to her wound on the left leg this is pretty much just about healed which is great news. Unfortunately in regard to the wound on her right leg this is not doing nearly as well in fact I think we probably need to see about a culture today that was discussed with her. 02-04-2022 upon evaluation today patient appears to be doing well with regard to her left leg which in fact might actually be completely healed I am not 100% sure. Nonetheless this is shown signs of excellent improvement which is great news. With that being said in regards to the right leg there is some slough and film buildup on the surface of the wound although she is feeling much better than last week this does appear to be doing much better than it was last week. Fortunately there does not appear to be any signs of active infection locally or systemically at this time. 02-11-2022 upon evaluation today patient's wounds appear to be doing decently well. In fact the left leg is healed although there is brand-new skin were definitely given still need to wrap her for the time being. On the right leg this is showing signs of some epithelial growth in the middle of the wound it still measures the same because there is still a larger area with speckled openings but again as far as the entire area being open this is significantly improved which is great news. I am very pleased. 6/13; left leg remains healed and she has a juxta light to apply to it today. There is some concern  about whether she is going to be able to do this at home. She lives with a daughter although she has her own disability. On the right the wound looks smaller to me nice epithelialization good edema control. 02-25-2022 upon evaluation today patient appears to be doing well with regard to her legs the left leg is still healed the right leg is doing much better. I think we are on the right track here.  With that being said unfortunately she continues to have significant issues with some other problems and she actually tells me on Saturday she was having lunch and went inside to sit down when she tells me she had all of a sudden a sharp sensation that went from her hand through her arm into her leg and her neck and she tells me that she could not see anything for the next hour it was completely black. Slowly after this her vision started to return and has been normal since. It sounds to me as if she may have had a TIA or mini stroke at this point. Nonetheless I think that she needs to have this checked out ASAP. Her primary care provider is Dr. Clide Deutscher at [facility]. We will get a try to get in touch with him. 03-10-2022 upon evaluation today patient's wound actually showed signs of good improvement. She seems to be making excellent progress here. I do not see [name], [name] ([id number]) [id number] Page 11 of 14 any evidence of active infection locally or systemically which is great news. No fevers, chills, nausea, vomiting, or diarrhea. She was in the hospital since have seen her last she ended up finding out that she was having syncopal episodes. That is what was going on with the blacking out and losing vision. Subsequently they adjusted some of her medication she seems to be doing better. 03-18-2022 upon evaluation today patient's wound is actually showing signs of good granulation and epithelization at this point. Fortunately I do not see any signs of active infection  locally or systemically which is great news. 03-25-2022 any evidence of active infection locally or systemically which is great news. Any evidence of active infection which is great news and overall the wound is looking better. Upon evaluation today patient appears to be doing well with regard to her leg ulcer. She has been tolerating the dressing changes without complication. Fortunately I do not see 7/25; the patient's wound is on the right lateral lower extremity using silver alginate 3 layer compression. She has chronic venous insufficiency and lymphedema. We are using a juxta lite on the left leg which does not have any open wounds but she is leaving this on all week it seems that there just is not a way to get this changed at home 04-08-2022 upon evaluation today patient appears to be doing well currently in regard to her wound. She has been tolerating the dressing changes without complication. Fortunately I do not see any evidence of active infection locally or systemically which is great news. No fever or chills noted her left leg has reopened at this point. 04-15-2022 upon evaluation patient's wounds actually are showing signs of improvement which is great news. Little by little I do believe her making progress here. In regard to the right leg this is looking better though the measurement does not speak to it there is a lot of new skin growth. On the left leg this is significantly smaller compared to last week most of the blistered area has completely closed. 04-22-2022 upon evaluation today patient appears to be doing excellent in regard to her wounds both are showing signs of improvement. Her knees and above are still extremely swollen the legs were rewrapped them are down significantly. With that being said I do not think that we will get a be able to keep them that way once were not rapid noted that she really has no way to be able to put juxta lites on and  off and nobody to help her with that.  That is the biggest concern that we have here at this point. Fortunately I do not think that there is any evidence of infection which is great news. 04-29-2022 upon evaluation today patient appears to be doing well with regard to her wounds. She has been tolerating the dressing changes without complication. Fortunately there does not appear to be any evidence of infection locally or systemically which is great news and overall I am extremely pleased in that regard. Left leg is healed the right leg is very close we discussed today the possibility of her granddaughter helping with putting on and off compression wraps for her. 05-06-2022 upon evaluation today patient appears to be doing well currently in regard to her left leg which is showing signs of being completely healed. With regard to her right leg she still has an open area though this is doing much better there is a lot less drainage than what we previously noted. Fortunately I see no evidence of active infection locally or systemically at this time which is great news. 05-20-2022 upon evaluation today patient appears to be doing excellent in regard to her leg. The left leg is still healed the right leg is much better. Fortunately I see no signs of infection locally or systemically at this time which is great news. No fevers, chills, nausea, vomiting, or diarrhea. 05-27-2022 upon evaluation today patient appears to be doing excellent in regard to her wound this is actually measuring smaller and looking much better. Fortunately I see no signs of active infection locally or systemically at this time. 06-03-2022 upon evaluation patient's wound actually is measuring smaller and looking much better. She still has a lot of discomfort but fortunately nothing to significant at this point which is great news. No fevers, chills, nausea, vomiting, or diarrhea. I think we are making some pretty good progress here. 06-10-2022 upon evaluation today patient appears  to be doing well currently in regard to her wound which is actually measuring somewhat smaller. This is actually 2 separate wounds that are clustered and this is making it appear little bit larger than what it is but nonetheless we are seeing some definite improvement. Fortunately I do not see any signs of active infection at this time. No fevers, chills, nausea, vomiting, or diarrhea. 06-17-2022 upon evaluation today patient appears to be doing well currently in regard to her wound although she was in the hospital since she was last here. That was from 06-12-2022 through 06-16-2022 which was yesterday. This was due to a CHF exacerbation. With that being said the patient has noted in her record review acute on chronic congestive heart failure with hypertensive emergency noted. She also has stage IIIa chronic kidney disease noted. With that being said I do think her wound is much better because I got a lot of fluid off to try to diurese her to a degree but her kidneys would not hold for that they put her on furosemide 40 mg and that is helping to get the fluid off she is much better but still has a lot of fluid buildup. 06-24-2022 upon evaluation today patient appears to be doing well currently in regard to her wound is not significantly smaller but it is improved overall. Fortunately I do not see any signs of active infection locally nor systemically at this time. 07-01-2022 upon evaluation today patient appears to be doing okay in regard to her wound I do not believe the silver alginate  is helping quite as much as I would like to see. I would actually recommend we switch back over to the [facility] at this point which I think may do better. Fortunately I do not see any signs of infection which is good news were going to switch to Tubigrip on the left leg on the right leg we will get a continue with the compression wrap. 07-08-2022 upon evaluation today patient unfortunately is not doing nearly as  well in regard to her left leg. We had to put her back into a 4-layer compression wrap today due to a significant blister that appeared. I do believe that we need to try to get this to reabsorb we will see how things do over the next week. With that being said the right leg is doing well the 4-layer compression wrap and switch to the [facility] seems to be very beneficial I am pleased with what we are seeing in that regard. 11/7; patient's wounds are on the right posterior lateral leg actually measures smaller and look healthy we have been using Hydrofera Blue under 3 layer compression. Last week she had a large blister that was left intact covered with alginate but the leg was wrapped in 3 layer. Today the left leg had unroofed but the skin still looks reasonably healthy. Nevertheless she is still going to require the left leg to be wrapped 07-22-2022 upon evaluation today patient unfortunately is extremely swollen in regard to her bilateral lower extremities despite the fact that she is currently being compression wrap a 4-layer compression wrap bilaterally. This is unfortunate and the fact that I believe this is not just a lower extremity swelling issue but to be honest this is a much more global issue with edema in regard to her congestive heart failure. Her BNP has been up in the past month and I am not sure where it is as it has been rechecked she sees the heart failure clinic on Thursday in 2 days. With that being said she does tell me that she is having trouble laying down she tells me that she is having trouble walking as well. With all that being said I advised that she probably needs to go to the hospital for further evaluation. 07-29-2022 upon evaluation today patient appears to be doing well currently in regard to her wound on the right leg I am very pleased the left leg is also doing better though it is weeping quite a bit. I think we need to do something to Help calm this down a bit.  She is in agreement with that plan. 08-05-2022 upon evaluation today patient's wounds are showing signs of improvement albeit slowly. Fortunately I do not see any evidence of infection locally nor systemically at this time which is great news. No fevers, chills, nausea, vomiting, or diarrhea. [name], [name] ([id number]) [id number] Page 12 of 14 Objective Constitutional Obese and well-hydrated in no acute distress. Vitals Time Taken: 2:14 PM, Height: 69 in, Weight: 244 lbs, BMI: 36, Temperature: 98.2 F, Pulse: 55 bpm, Respiratory Rate: 16 breaths/min, Blood Pressure: 130/80 mmHg. Respiratory normal breathing without difficulty. Psychiatric this patient is able to make decisions and demonstrates good insight into disease process. Alert and Oriented x 3. pleasant and cooperative. General Notes: Upon inspection patient's wound bed actually showed signs of good granulation epithelization at this point. Overall I do believe that we are headed in the right direction and I do think that the patient is making progress here. I do want  to make sure that she continues to try to elevate her legs much as possible and fortunately she has a lot of swelling and there is only so much that we can do to count to keep this under control but we are doing everything we can do in that regard. Integumentary (Hair, Skin) Wound #4 status is Open. Original cause of wound was Gradually Appeared. The date acquired was: 10/27/2021. The wound has been in treatment 40 weeks. The wound is located on the Right,Lateral Lower Leg. The wound measures 2.6cm length x 1.7cm width x 0.1cm depth; 3.471cm^2 area and 0.347cm^3 volume. There is Fat Layer (Subcutaneous Tissue) exposed. There is no tunneling or undermining noted. There is a medium amount of serosanguineous drainage noted. There is medium (34-66%) pink granulation within the wound bed. There is a medium (34-66%) amount of necrotic tissue within  the wound bed including Adherent Slough. Wound #6 status is Open. Original cause of wound was Blister. The date acquired was: 07/09/2022. The wound has been in treatment 2 weeks. The wound is located on the Left,Medial Lower Leg. The wound measures 10cm length x 8cm width x 0.1cm depth; 62.832cm^2 area and 6.283cm^3 volume. There is no tunneling or undermining noted. There is a medium amount of serosanguineous drainage noted. There is no granulation within the wound bed. There is no necrotic tissue within the wound bed. 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 There was a Three Layer Compression Therapy Procedure by Carlene Coria, RN. Post procedure Diagnosis Wound #4: Same as Pre-Procedure Wound #6 Pre-procedure diagnosis of Wound #6 is a Lymphedema located on the Left,Medial Lower Leg . There was a Three Layer Compression Therapy Procedure by Carlene Coria, RN. Post procedure Diagnosis Wound #6: Same as Pre-Procedure Plan Follow-up Appointments: Return Appointment in 1 week. Nurse Visit as needed Bathing/ Shower/ Hygiene: May shower with wound dressing protected with water repellent cover or cast protector. No tub bath. Anesthetic (Use 'Patient Medications' Section for Anesthetic Order Entry): Lidocaine applied to wound bed Edema Control - Lymphedema / Segmental Compressive Device / Other: Optional: One layer of unna paste to top of compression wrap (to act as an anchor). Elevate, Exercise Daily and Avoid Standing for Long Periods of Time. Elevate legs to the level of the heart and pump ankles as often as possible Elevate leg(s) parallel to the floor when  sitting. Compression Pump: Use compression pump on left lower extremity for 60 minutes, twice daily. - You may pump over wraps Compression Pump: Use compression pump on right lower extremity for 60 minutes, twice daily. - You may pump over wraps WOUND #4: - Lower Leg Wound Laterality: Right, Lateral Cleanser: Soap and Water 1 x Per Week/30 Days [name], [name] ([id number]) [id number] Page 13 of 14 Discharge Instructions: Gently cleanse wound with antibacterial soap, rinse and pat dry prior to dressing wounds Cleanser: Wound Cleanser 1 x Per Week/30 Days Discharge Instructions: Wash your hands with soap and water. Remove old dressing, discard into plastic bag and place into trash. Cleanse the wound with Wound Cleanser prior to applying a clean dressing using gauze sponges, not tissues or cotton balls. Do not scrub or use excessive force. Pat dry using gauze sponges, not tissue or cotton balls.  Prim Dressing: Silvercel Small 2x2 (in/in) 1 x Per Week/30 Days ary Discharge Instructions: Apply Silvercel Small 2x2 (in/in) as instructed Com pression Wrap: 3-LAYER WRAP - Profore Lite LF 3 Multilayer Compression Bandaging System 1 x Per Week/30 Days Discharge Instructions: Apply 3 multi-layer wrap as prescribed. WOUND #6: - Lower Leg Wound Laterality: Left, Medial Cleanser: Soap and Water 1 x Per Week/30 Days Discharge Instructions: Gently cleanse wound with antibacterial soap, rinse and pat dry prior to dressing wounds Cleanser: Wound Cleanser 1 x Per Week/30 Days Discharge Instructions: Wash your hands with soap and water. Remove old dressing, discard into plastic bag and place into trash. Cleanse the wound with Wound Cleanser prior to applying a clean dressing using gauze sponges, not tissues or cotton balls. Do not scrub or use excessive force. Pat dry using gauze sponges, not tissue or cotton balls. Prim Dressing: Silvercel 4 1/4x 4 1/4 (in/in) 1 x Per Week/30  Days ary Discharge Instructions: Apply Silvercel 4 1/4x 4 1/4 (in/in) as instructed Secondary Dressing: ABD Pad 5x9 (in/in) 1 x Per Week/30 Days Discharge Instructions: Cover with ABD pad Com pression Wrap: 3-LAYER WRAP - Profore Lite LF 3 Multilayer Compression Bandaging System 1 x Per Week/30 Days Discharge Instructions: Apply 3 multi-layer wrap as prescribed. 1. I am going to suggest that we have the patient continue to monitor for any evidence of worsening or infection. Obviously if anything changes she knows contact the office let me know. 2. Also can recommend that we have her elevate her legs is much as possible or using a 3 layer compression wrap currently and we are using silver cell and ABD pad on the left leg and Hydrofera Blue with ABD pad on the right leg. We will see patient back for reevaluation in 1 week here in the clinic. If anything worsens or changes patient will contact our office for additional recommendations. Electronic Signature(s) Signed: 08/05/2022 2:41:16 PM By: Worthy Keeler PA-C Entered By: Worthy Keeler on 08/05/2022 14:41:15 -------------------------------------------------------------------------------- SuperBill Details Patient Name: Date of Service: [name] 08/05/2022 Medical Record Number: [id number] Patient Account Number: [id number] Date of Birth/Sex: Treating RN: 09/09/1945 (76 y.o. [name] Primary Care Provider: [name] Other Clinician: Referring Provider: Treating Provider/Extender: Mayer Masker, Ngwe Weeks in Treatment: 10 Diagnosis Coding ICD-10 Codes Code Description I89.0 Lymphedema, not elsewhere classified I87.332 Chronic venous hypertension (idiopathic) with ulcer and inflammation of left lower extremity L97.812 Non-pressure chronic ulcer of other part of right lower leg with fat layer exposed Facility Procedures : CPT4: Code [id number] foot Description: 1 BILATERAL: Application of multi-layer  venous compression system; leg (below knee), including ankle and . Modifier: Quantity: 1 Physician Procedures : CPT4 Code Description Modifier [id number] - WC PHYS LEVEL 3 - EST PT [name], [name] ([id number]) [id number] Page ICD-10 Diagnosis Description I89.0 Lymphedema, not elsewhere classified I87.332 Chronic venous  hypertension (idiopathic) with ulcer and inflammation of left lower extremity L97.812 Non-pressure chronic ulcer of other part of right lower leg with fat layer exposed Quantity: 1 14 of 14 Electronic Signature(s) Signed: 08/05/2022 2:41:36 PM By: Worthy Keeler PA-C Entered By: Worthy Keeler on 08/05/2022 14:41:36

## 2022-08-05 NOTE — Progress Notes (Addendum)
AMORETTE, CHARRETTE ([id number]) [id number] Page 1 of 11 Visit Report for 08/05/2022 Arrival Information Details Patient Name: Date of Service: [name] 08/05/2022 2:00 PM Medical Record Number: [id number] Patient Account Number: [id number] Date of Birth/Sex: Treating RN: 17-Nov-1945 (76 y.o. [name] Primary Care Wana Mount: [name] Other Clinician: Referring Stephens Shreve: Treating Elliott Quade/Extender: Mayer Masker, Ngwe Weeks in Treatment: 40 Visit Information History Since Last Visit All ordered tests and consults were completed: No Patient Arrived: Ambulatory Added or deleted any medications: No Arrival Time: 13:50 Any new allergies or adverse reactions: No Accompanied By: self Had a fall or experienced change in No Transfer Assistance: None activities of daily living that may affect Patient Identification Verified: Yes risk of falls: Secondary Verification Process Completed: Yes Signs or symptoms of abuse/neglect since last visito No Patient Requires Transmission-Based Precautions: No Hospitalized since last visit: No Patient Has Alerts: Yes Implantable device outside of the clinic excluding No Patient Alerts: NOT DIABETIC cellular tissue based products placed in the center since last visit: Has Dressing in Place as Prescribed: Yes Has Compression in Place as Prescribed: Yes Pain Present Now: No Electronic Signature(s) Signed: 08/05/2022 4:00:23 PM By: Carlene Coria RN Entered By: Carlene Coria on 08/05/2022 14:14:04 -------------------------------------------------------------------------------- Clinic Level of Care Assessment Details Patient Name: Date of Service: [name] 08/05/2022 2:00 PM Medical Record Number: [id number] Patient Account Number: [id number] Date of Birth/Sex: Treating RN: 06-25-1946 (76 y.o. [name] Primary Care Treyven Lafauci: [name] Other Clinician: Referring Nathalee Smarr: Treating  Jimena Wieczorek/Extender: Mayer Masker, Ngwe Weeks in Treatment: 61 Clinic Level of Care Assessment Items TOOL 4 Quantity Score '[]'$  - 0 Use when only an EandM is performed on FOLLOW-UP visit ASSESSMENTS - Nursing Assessment / Reassessment '[]'$  - 0 Reassessment of Co-morbidities (includes updates in patient status) [name], [name] ([id number]) [id number] Page 2 of 11 '[]'$  - 0 Reassessment of Adherence to Treatment Plan ASSESSMENTS - Wound and Skin A ssessment / Reassessment '[]'$  - 0 Simple Wound Assessment / Reassessment - one wound '[]'$  - 0 Complex Wound Assessment / Reassessment - multiple wounds '[]'$  - 0 Dermatologic / Skin Assessment (not related to wound area) ASSESSMENTS - Focused Assessment '[]'$  - 0 Circumferential Edema Measurements - multi extremities '[]'$  - 0 Nutritional Assessment / Counseling / Intervention '[]'$  - 0 Lower Extremity Assessment (monofilament, tuning fork, pulses) '[]'$  - 0 Peripheral Arterial Disease Assessment (using hand held doppler) ASSESSMENTS - Ostomy and/or Continence Assessment and Care '[]'$  - 0 Incontinence Assessment and Management '[]'$  - 0 Ostomy Care Assessment and Management (repouching, etc.) PROCESS - Coordination of Care '[]'$  - 0 Simple Patient / Family Education for ongoing care '[]'$  - 0 Complex (extensive) Patient / Family Education for ongoing care '[]'$  - 0 Staff obtains Programmer, systems, Records, T Results / Process Orders est '[]'$  - 0 Staff telephones HHA, Nursing Homes / Clarify orders / etc '[]'$  - 0 Routine Transfer to another Facility (non-emergent condition) '[]'$  - 0 Routine Hospital Admission (non-emergent condition) '[]'$  - 0 New Admissions / Biomedical engineer / Ordering NPWT Apligraf, etc. , '[]'$  - 0 Emergency Hospital Admission (emergent condition) '[]'$  - 0 Simple Discharge Coordination '[]'$  - 0 Complex (extensive) Discharge Coordination PROCESS - Special Needs '[]'$  - 0 Pediatric / Minor Patient Management '[]'$  - 0 Isolation  Patient Management '[]'$  - 0 Hearing / Language / Visual special needs '[]'$  - 0 Assessment of Community assistance (transportation, D/C planning, etc.) '[]'$  - 0 Additional assistance / Altered mentation '[]'$  - 0 Support Surface(s)  Assessment (bed, cushion, seat, etc.) INTERVENTIONS - Wound Cleansing / Measurement '[]'$  - 0 Simple Wound Cleansing - one wound '[]'$  - 0 Complex Wound Cleansing - multiple wounds '[]'$  - 0 Wound Imaging (photographs - any number of wounds) '[]'$  - 0 Wound Tracing (instead of photographs) '[]'$  - 0 Simple Wound Measurement - one wound '[]'$  - 0 Complex Wound Measurement - multiple wounds INTERVENTIONS - Wound Dressings '[]'$  - 0 Small Wound Dressing one or multiple wounds '[]'$  - 0 Medium Wound Dressing one or multiple wounds '[]'$  - 0 Large Wound Dressing one or multiple wounds '[]'$  - 0 Application of Medications - topical '[]'$  - 0 Application of Medications - injection INTERVENTIONS - Miscellaneous [name], [name] ([id number]) [id number] Page 3 of 11 '[]'$  - 0 External ear exam '[]'$  - 0 Specimen Collection (cultures, biopsies, blood, body fluids, etc.) '[]'$  - 0 Specimen(s) / Culture(s) sent or taken to Lab for analysis '[]'$  - 0 Patient Transfer (multiple staff / Harrel Lemon Lift / Similar devices) '[]'$  - 0 Simple Staple / Suture removal (25 or less) '[]'$  - 0 Complex Staple / Suture removal (26 or more) '[]'$  - 0 Hypo / Hyperglycemic Management (close monitor of Blood Glucose) '[]'$  - 0 Ankle / Brachial Index (ABI) - do not check if billed separately '[]'$  - 0 Vital Signs Has the patient been seen at the hospital within the last three years: Yes Total Score: 0 Level Of Care: ____ Electronic Signature(s) Signed: 08/05/2022 4:00:23 PM By: Carlene Coria RN Entered By: Carlene Coria on 08/05/2022 14:28:59 -------------------------------------------------------------------------------- Compression Therapy Details Patient Name: Date of Service: [name]  08/05/2022 2:00 PM Medical Record Number: [id number] Patient Account Number: [id number] Date of Birth/Sex: Treating RN: 1946/01/25 (76 y.o. [name] Primary Care Onika Gudiel: [name] Other Clinician: Referring Jaimes Eckert: Treating Merrie Epler/Extender: Mayer Masker, Ngwe Weeks in Treatment: 36 Compression Therapy Performed for Wound Assessment: Wound #4 Right,Lateral Lower Leg Performed By: Clinician Carlene Coria, RN Compression Type: Three Layer Post Procedure Diagnosis Same as Pre-procedure Electronic Signature(s) Signed: 08/05/2022 4:00:23 PM By: Carlene Coria RN Entered By: Carlene Coria on 08/05/2022 14:28:07 -------------------------------------------------------------------------------- Compression Therapy Details Patient Name: Date of Service: [name] 08/05/2022 2:00 PM Medical Record Number: [id number] Patient Account Number: [id number] [name], [name] ([id number]) [id number] Page 4 of 11 Date of Birth/Sex: Treating RN: 1946-04-25 (76 y.o. [name] Primary Care Edessa Jakubowicz: [name] Other Clinician: Referring Farley Crooker: Treating Chimamanda Siegfried/Extender: Mayer Masker, Ngwe Weeks in Treatment: 75 Compression Therapy Performed for Wound Assessment: Wound #6 Left,Medial Lower Leg Performed By: Clinician Carlene Coria, RN Compression Type: Three Layer Post Procedure Diagnosis Same as Pre-procedure Electronic Signature(s) Signed: 08/05/2022 4:00:23 PM By: Carlene Coria RN Entered By: Carlene Coria on 08/05/2022 14:28:21 -------------------------------------------------------------------------------- Encounter Discharge Information Details Patient Name: Date of Service: [name] 08/05/2022 2:00 PM Medical Record Number: [id number] Patient Account Number: [id number] Date of Birth/Sex: Treating RN: Jan 01, 1946 (76 y.o. [name] Primary Care Jacobus Colvin: [name] Other Clinician: Referring  Christalynn Boise: Treating Shailen Thielen/Extender: Mayer Masker, Ngwe Weeks in Treatment: 44 Encounter Discharge Information Items Discharge Condition: Stable Ambulatory Status: Wheelchair Discharge Destination: Home Transportation: Private Auto Accompanied By: self Schedule Follow-up Appointment: Yes Clinical Summary of Care: Electronic Signature(s) Signed: 08/05/2022 4:00:23 PM By: Carlene Coria RN Entered By: Carlene Coria on 08/05/2022 14:30:00 -------------------------------------------------------------------------------- Lower Extremity Assessment Details Patient Name: Date of Service: [name] 08/05/2022 2:00 PM Medical Record Number: [id number] Patient Account Number: [id number] Date of Birth/Sex: Treating  RN: [birth year] (76 y.o. [name] Primary Care Cohan Stipes: [name] Other Clinician: Referring Melida Northington: Treating Dawt Reeb/Extender: Mayer Masker, Ngwe Weeks in Treatment: [address], [place] ([id number]) [id number] Page 5 of 11 Edema Assessment Assessed: [Left: No] [Right: No] Edema: [Left: Yes] [Right: Yes] Calf Left: Right: Point of Measurement: From Medial Instep 42 cm 44 cm Ankle Left: Right: Point of Measurement: From Medial Instep 24 cm 23 cm Vascular Assessment Pulses: Dorsalis Pedis Palpable: [Left:Yes] [Right:Yes] Electronic Signature(s) Signed: 08/05/2022 4:00:23 PM By: Carlene Coria RN Entered By: Carlene Coria on 08/05/2022 14:17:07 -------------------------------------------------------------------------------- Multi Wound Chart Details Patient Name: Date of Service: [name] 08/05/2022 2:00 PM Medical Record Number: [id number] Patient Account Number: [id number] Date of Birth/Sex: Treating RN: 1946-02-20 (76 y.o. [name] Primary Care Orson Rho: [name] Other Clinician: Referring Caspar Favila: Treating Navada Osterhout/Extender: Mayer Masker, Ngwe Weeks in Treatment: 47 Vital  Signs Height(in): 69 Pulse(bpm): 55 Weight(lbs): 244 Blood Pressure(mmHg): 130/80 Body Mass Index(BMI): 36 Temperature(F): 98.2 Respiratory Rate(breaths/min): 16 [4:Photos:] [[name]/A:[name]/A] Right, Lateral Lower Leg Left, Medial Lower Leg [name]/A Wound Location: Gradually Appeared Blister [name]/A Wounding Event: Venous Leg Ulcer Lymphedema [name]/A Primary Etiology: Cataracts, Lymphedema, Sleep Cataracts, Lymphedema, Sleep [name]/A Comorbid History: Apnea, Congestive Heart Failure, Apnea, Congestive Heart Failure, Hypertension, Osteoarthritis, Hypertension, Osteoarthritis, Neuropathy, Received Chemotherapy, Neuropathy, Received Chemotherapy, Received Radiation Received Radiation 10/27/2021 07/09/2022 [name]/A Date Acquired: [name], [name] ([id number]) [id number] Page 6 of 11 40 2 [name]/A Weeks of Treatment: Open Open [name]/A Wound Status: No No [name]/A Wound Recurrence: 2.6x1.7x0.1 10x8x0.1 [name]/A Measurements L x W x D (cm) 3.471 62.832 [name]/A A (cm) : rea 0.347 6.283 [name]/A Volume (cm) : -194.70% 27.30% [name]/A % Reduction in Area: -194.10% 27.30% [name]/A % Reduction in Volume: Full Thickness Without Exposed Full Thickness Without Exposed [name]/A Classification: Support Structures Support Structures Medium Medium [name]/A Exudate Amount: Serosanguineous Serosanguineous [name]/A Exudate Type: red, brown red, brown [name]/A Exudate Color: Medium (34-66%) None Present (0%) [name]/A Granulation Amount: Pink [name]/A [name]/A Granulation Quality: Medium (34-66%) None Present (0%) [name]/A Necrotic Amount: Fat Layer (Subcutaneous Tissue): Yes Fascia: No [name]/A Exposed Structures: Fascia: No Fat Layer (Subcutaneous Tissue): No Tendon: No Tendon: No Muscle: No Muscle: No Joint: No Joint: No Bone: No Bone: No None None [name]/A Epithelialization: Treatment Notes Electronic Signature(s) Signed: 08/05/2022 4:00:23 PM By: Carlene Coria RN Entered By: Carlene Coria on 08/05/2022  14:17:29 -------------------------------------------------------------------------------- [place] Details Patient Name: Date of Service: [name] 08/05/2022 2:00 PM Medical Record Number: [id number] Patient Account Number: [id number] Date of Birth/Sex: Treating RN: 09/02/1946 (76 y.o. [name] Primary Care Thalya Fouche: [name] Other Clinician: Referring Vitoria Conyer: Treating Zadie Deemer/Extender: Mayer Masker, Ngwe Weeks in Treatment: 6 Active Inactive Electronic Signature(s) Signed: 08/05/2022 4:00:23 PM By: Carlene Coria RN Entered By: Carlene Coria on 08/05/2022 14:17:20 Pain Assessment Details -------------------------------------------------------------------------------- [name] ([id number]) [id number] Page 7 of 11 Patient Name: Date of Service: [name] 08/05/2022 2:00 PM Medical Record Number: [id number] Patient Account Number: [id number] Date of Birth/Sex: Treating RN: 16-Jun-1946 (76 y.o. [name] Primary Care Derian Pfost: [name] Other Clinician: Referring Siomara Burkel: Treating Evey Mcmahan/Extender: Mayer Masker, Ngwe Weeks in Treatment: 37 Active Problems Location of Pain Severity and Description of Pain Patient Has Paino No Site Locations Pain Management and Medication Current Pain Management: Electronic Signature(s) Signed: 08/05/2022 4:00:23 PM By: Carlene Coria RN Entered By: Carlene Coria on 08/05/2022 14:14:45 -------------------------------------------------------------------------------- Patient/Caregiver Education Details Patient Name: Date of Service: [name], [name] 11/28/2023andnbsp2:00 PM  Medical Record Number: [id number] Patient Account Number: [id number] Date of Birth/Gender: Treating RN: May 19, 1946 (76 y.o. [name] Primary Care Physician: [name] Other Clinician: Referring Physician: Treating Physician/Extender: Alto [place] Weeks in Treatment: 43 Education Assessment Education Provided To: Patient Education Topics Provided Wound/Skin Impairment: Methods: Explain/Verbal Responses: State content correctly [name], [name] ([id number]) [id number] Page 8 of 11 Electronic Signature(s) Signed: 08/05/2022 4:00:23 PM By: Carlene Coria RN Entered By: Carlene Coria on 08/05/2022 14:29:22 -------------------------------------------------------------------------------- Wound Assessment Details Patient Name: Date of Service: [name] 08/05/2022 2:00 PM Medical Record Number: [id number] Patient Account Number: [id number] Date of Birth/Sex: Treating RN: 10-Feb-1946 (76 y.o. [name] Primary Care Lurine Imel: [name] Other Clinician: Referring Yoandri Congrove: Treating Lametria Klunk/Extender: Mayer Masker, Ngwe Weeks in Treatment: 87 Wound Status Wound Number: 4 Primary Venous Leg Ulcer Etiology: Wound Location: Right, Lateral Lower Leg Wound Open Wounding Event: Gradually Appeared Status: Date Acquired: 10/27/2021 Comorbid Cataracts, Lymphedema, Sleep Apnea, Congestive Heart Failure, Weeks Of Treatment: 40 History: Hypertension, Osteoarthritis, Neuropathy, Received Chemotherapy, Clustered Wound: No Received Radiation Photos Wound Measurements Length: (cm) 2.6 Width: (cm) 1.7 Depth: (cm) 0.1 Area: (cm) 3.471 Volume: (cm) 0.347 % Reduction in Area: -194.7% % Reduction in Volume: -194.1% Epithelialization: None Tunneling: No Undermining: No Wound Description Classification: Full Thickness Without Exposed Support Exudate Amount: Medium Exudate Type: Serosanguineous Exudate Color: red, brown Structures Foul Odor After Cleansing: No Slough/Fibrino Yes Wound Bed Granulation Amount: Medium (34-66%) Exposed Structure Granulation Quality: Pink Fascia Exposed: No Necrotic Amount: Medium (34-66%) Fat Layer (Subcutaneous Tissue) Exposed: Yes Necrotic Quality:  Adherent Slough Tendon Exposed: No Muscle Exposed: No Joint Exposed: No Bone Exposed: No Treatment Notes [name], [name] ([id number]) [id number] Page 9 of 11 Wound #4 (Lower Leg) Wound Laterality: Right, Lateral Cleanser Soap and Water Discharge Instruction: Gently cleanse wound with antibacterial soap, rinse and pat dry prior to dressing wounds Wound Cleanser Discharge Instruction: Wash your hands with soap and water. Remove old dressing, discard into plastic bag and place into trash. Cleanse the wound with Wound Cleanser prior to applying a clean dressing using gauze sponges, not tissues or cotton balls. Do not scrub or use excessive force. Pat dry using gauze sponges, not tissue or cotton balls. Peri-Wound Care Topical Primary Dressing Silvercel Small 2x2 (in/in) Discharge Instruction: Apply Silvercel Small 2x2 (in/in) as instructed Secondary Dressing Secured With Compression Wrap 3-LAYER WRAP - Profore Lite LF 3 Multilayer Compression Bandaging System Discharge Instruction: Apply 3 multi-layer wrap as prescribed. Compression Stockings Add-Ons Electronic Signature(s) Signed: 08/05/2022 4:00:23 PM By: Carlene Coria RN Entered By: Carlene Coria on 08/05/2022 14:16:15 -------------------------------------------------------------------------------- Wound Assessment Details Patient Name: Date of Service: [name] 08/05/2022 2:00 PM Medical Record Number: [id number] Patient Account Number: [id number] Date of Birth/Sex: Treating RN: 1946-05-08 (76 y.o. [name] Primary Care Isamar Nazir: [name] Other Clinician: Referring Dayzha Pogosyan: Treating Laneya Gasaway/Extender: Mayer Masker, Ngwe Weeks in Treatment: 34 Wound Status Wound Number: 6 Primary Lymphedema Etiology: Wound Location: Left, Medial Lower Leg Wound Open Wounding Event: Blister Status: Date Acquired: 07/09/2022 Comorbid Cataracts, Lymphedema, Sleep Apnea, Congestive  Heart Failure, Weeks Of Treatment: 2 History: Hypertension, Osteoarthritis, Neuropathy, Received Chemotherapy, Clustered Wound: No Received Radiation Photos [name], [name] ([id number]) [id number] Page 10 of 11 Wound Measurements Length: (cm) 10 Width: (cm) 8 Depth: (cm) 0.1 Area: (cm) 62.832 Volume: (cm) 6.283 % Reduction in Area: 27.3% % Reduction in Volume: 27.3% Epithelialization: None Tunneling: No Undermining: No Wound Description Classification: Full Thickness Without Exposed Support Structures Exudate  Amount: Medium Exudate Type: Serosanguineous Exudate Color: red, brown Foul Odor After Cleansing: No Slough/Fibrino No Wound Bed Granulation Amount: None Present (0%) Exposed Structure Necrotic Amount: None Present (0%) Fascia Exposed: No Fat Layer (Subcutaneous Tissue) Exposed: No Tendon Exposed: No Muscle Exposed: No Joint Exposed: No Bone Exposed: No Treatment Notes Wound #6 (Lower Leg) Wound Laterality: Left, Medial Cleanser Soap and Water Discharge Instruction: Gently cleanse wound with antibacterial soap, rinse and pat dry prior to dressing wounds Wound Cleanser Discharge Instruction: Wash your hands with soap and water. Remove old dressing, discard into plastic bag and place into trash. Cleanse the wound with Wound Cleanser prior to applying a clean dressing using gauze sponges, not tissues or cotton balls. Do not scrub or use excessive force. Pat dry using gauze sponges, not tissue or cotton balls. Peri-Wound Care Topical Primary Dressing Silvercel 4 1/4x 4 1/4 (in/in) Discharge Instruction: Apply Silvercel 4 1/4x 4 1/4 (in/in) as instructed Secondary Dressing ABD Pad 5x9 (in/in) Discharge Instruction: Cover with ABD pad Secured With Compression Wrap 3-LAYER WRAP - Profore Lite LF 3 Multilayer Compression Bandaging System Discharge Instruction: Apply 3 multi-layer wrap as prescribed. Compression  Stockings Add-Ons Electronic Signature(s) Signed: 08/05/2022 4:00:23 PM By: Carlene Coria RN Entered By: Carlene Coria on 08/05/2022 14:16:37 [name] ([id number]) [id number] Page 11 of 11 -------------------------------------------------------------------------------- Vitals Details Patient Name: Date of Service: [name] 08/05/2022 2:00 PM Medical Record Number: [id number] Patient Account Number: [id number] Date of Birth/Sex: Treating RN: Sep 09, 1945 (76 y.o. [name] Primary Care Delphina Schum: [name] Other Clinician: Referring Nihal Marzella: Treating Arley Salamone/Extender: Mayer Masker, Ngwe Weeks in Treatment: 34 Vital Signs Time Taken: 14:14 Temperature (F): 98.2 Height (in): 69 Pulse (bpm): 55 Weight (lbs): 244 Respiratory Rate (breaths/min): 16 Body Mass Index (BMI): 36 Blood Pressure (mmHg): 130/80 Reference Range: 80 - 120 mg / dl Electronic Signature(s) Signed: 08/05/2022 4:00:23 PM By: Carlene Coria RN Entered By: Carlene Coria on 08/05/2022 14:14:37

## 2022-08-07 ENCOUNTER — Ambulatory Visit: Admission: RE | Admit: 2022-08-07 | Payer: Medicare Other | Source: Ambulatory Visit

## 2022-08-07 ENCOUNTER — Telehealth: Payer: Self-pay | Admitting: *Deleted

## 2022-08-07 NOTE — Telephone Encounter (Signed)
Called the pt and asked her if she was coming to the appt for bx today and she said yes. I told her that she has to be [facility] at 12:30today. She is in agreement. I called janet and told her it is good ad she willc ome today

## 2022-08-08 ENCOUNTER — Encounter: Payer: Self-pay | Admitting: Family

## 2022-08-08 ENCOUNTER — Ambulatory Visit: Payer: Medicare Other | Attending: Family | Admitting: Family

## 2022-08-08 VITALS — BP 123/56 | HR 75 | Resp 18 | Wt 243.0 lb

## 2022-08-08 DIAGNOSIS — Z79811 Long term (current) use of aromatase inhibitors: Secondary | ICD-10-CM | POA: Diagnosis not present

## 2022-08-08 DIAGNOSIS — F419 Anxiety disorder, unspecified: Secondary | ICD-10-CM | POA: Insufficient documentation

## 2022-08-08 DIAGNOSIS — I10 Essential (primary) hypertension: Secondary | ICD-10-CM

## 2022-08-08 DIAGNOSIS — I5032 Chronic diastolic (congestive) heart failure: Secondary | ICD-10-CM | POA: Insufficient documentation

## 2022-08-08 DIAGNOSIS — Z87891 Personal history of nicotine dependence: Secondary | ICD-10-CM | POA: Diagnosis not present

## 2022-08-08 DIAGNOSIS — C50919 Malignant neoplasm of unspecified site of unspecified female breast: Secondary | ICD-10-CM | POA: Diagnosis not present

## 2022-08-08 DIAGNOSIS — I130 Hypertensive heart and chronic kidney disease with heart failure and stage 1 through stage 4 chronic kidney disease, or unspecified chronic kidney disease: Secondary | ICD-10-CM | POA: Insufficient documentation

## 2022-08-08 DIAGNOSIS — C50911 Malignant neoplasm of unspecified site of right female breast: Secondary | ICD-10-CM | POA: Diagnosis not present

## 2022-08-08 DIAGNOSIS — N189 Chronic kidney disease, unspecified: Secondary | ICD-10-CM | POA: Diagnosis not present

## 2022-08-08 DIAGNOSIS — G4733 Obstructive sleep apnea (adult) (pediatric): Secondary | ICD-10-CM | POA: Insufficient documentation

## 2022-08-08 DIAGNOSIS — Z853 Personal history of malignant neoplasm of breast: Secondary | ICD-10-CM | POA: Insufficient documentation

## 2022-08-08 DIAGNOSIS — I890 Lymphedema, not elsewhere classified: Secondary | ICD-10-CM | POA: Diagnosis not present

## 2022-08-08 DIAGNOSIS — Z170 Estrogen receptor positive status [ER+]: Secondary | ICD-10-CM

## 2022-08-08 DIAGNOSIS — K219 Gastro-esophageal reflux disease without esophagitis: Secondary | ICD-10-CM | POA: Diagnosis not present

## 2022-08-08 MED ORDER — EMPAGLIFLOZIN 10 MG PO TABS: 10.0000 mg | ORAL_TABLET | Freq: Every day | ORAL | 5 refills | Status: DC

## 2022-08-08 NOTE — Patient Instructions (Addendum)
Continue weighing daily and call for an overnight weight gain of 3 pounds or more or a weekly weight gain of more than 5 pounds.   If you have voicemail, please make sure your mailbox is cleaned out so that we may leave a message and please make sure to listen to any voicemails.    Start taking jardiance as 1 tablet every morning. This medication was sent to [facility] on [address]

## 2022-08-08 NOTE — Progress Notes (Unsigned)
Patient ID: [name], female    DOB: [birth year], 76 y.o.   MRN: [id number]  HPI  [name] is a 76 y/o female with a history of breast cancer, CKD, HTN, GERD, sleep apnea, previous tobacco use and chronic heart failure.   Echo report from 03/04/22 reviewed and showed an EF of 50-55% along with mild LVH/ LAE and mild MR.   Admitted 06/12/22 due to acute on chronic CHF with volume overload. Initially given IV lasix with transition to oral diuretics. HTN emergency and meds adjusted. Elevated troponin thought to be due to demand ischemia. Weaned off oxygen to room air. Discharged after 3 days.   She presents today for a follow-up visit with a chief complaint of moderate fatigue with little exertion. Describes this as chronic in nature. She has associated decreased appetite, cough, shortness of breath, edema, difficulty sleeping, anxiety & dizziness along with this. She denies any abdominal distention, palpitations, chest pain or wheezing.   Unable to consistently weigh herself because of her instability when standing.   Goes to the wound center for her lymphedema and has her legs wrapped 1-2 times/ week.   Past Medical History:  Diagnosis Date   Abnormality of gait    Acute respiratory failure with hypoxia ([place]) 06/13/2022   Breast cancer ([place]) 02/05/2005   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    Breast cancer ([place]) 2019   right breast   CHF (congestive heart failure) ([place])    March 28, 2017 echo: EF 50-55%; 1) echo 07/2011: EF 20-25%, mild concentric hypertrophy, diffuse HK, regional WMA cannot be excluded, grade 1 DD, mitral valve with mild regurg, LA mildly dilated). 2) EF 40-45%, mild AS,                     Chronic kidney disease    Complication of anesthesia 2006   pt states "hard to wake up" after L breast surgery    Edema    GERD (gastroesophageal reflux disease)    Headache    Heart murmur     Hypertension    Hypokinesis    global, EF 35-45 % from Echo.   Malignant neoplasm of breast (female), unspecified site 06/14/2018   RIGHT T1b, N0; ER/PR positive, HER-2 negative.   Neuropathy    Obesity    Pain in joint, site unspecified    Personal history of chemotherapy 2006   left breast   Personal history of radiation therapy 2019   right breast   Personal history of radiation therapy 2006   left breast   Pneumonia    Sleep apnea    Tinea pedis    Unspecified hearing loss    bilateral   Past Surgical History:  Procedure Laterality Date   BREAST BIOPSY Right 2019   11 oc-INVASIVE MAMMARY CARCINOMA, NO SPECIAL TYPE   BREAST BIOPSY Right 2019   1030 oc- neg   BREAST LUMPECTOMY  2006   left breast    CATARACT EXTRACTION     left eye   HERNIA REPAIR  05/25/2006   Incarcerated omentum in ventral hernia, Ventralight mesh, combined lap/ open with omental resection.    PARTIAL MASTECTOMY WITH NEEDLE LOCALIZATION Right 06/14/2018   Procedure: PARTIAL MASTECTOMY WITH NEEDLE LOCALIZATION;  Surgeon: Robert Bellow, MD;  Location: [facility] ORS;  Service: General;  Laterality: Right;   PORT-A-CATH REMOVAL  05/25/2006   SENTINEL NODE BIOPSY Right 06/14/2018  Procedure: SENTINEL NODE BIOPSY;  Surgeon: Robert Bellow, MD;  Location: [facility] ORS;  Service: General;  Laterality: Right;   TUBAL LIGATION     Family History  Problem Relation Age of Onset   Hypertension Mother        deceased 93   Cataracts Mother    Breast cancer Other 76       maternal half-sister; deceased 51   Coronary artery disease Other    Social History   Tobacco Use   Smoking status: Former    Packs/day: 0.50    Years: 30.00    Total pack years: 15.00    Types: Cigarettes    Quit date: 07/14/2000    Years since quitting: 22.0   Smokeless tobacco: Never  Substance Use Topics   Alcohol use: No   No Known Allergies  Prior to Admission medications   Medication Sig Start Date End Date Taking?  Authorizing Provider  anastrozole (ARIMIDEX) 1 MG tablet TAKE 1 TABLET BY MOUTH ONCE DAILY. PTAIENT MUST MAKE AN APPOINTMENT TO SEE DR RAO 04/29/22  Yes Sindy Guadeloupe, MD  aspirin EC 81 MG tablet Take 81 mg by mouth daily.   Yes [provider]  cloNIDine (CATAPRES) 0.1 MG tablet Take 1 tablet (0.1 mg total) by mouth 2 (two) times daily as needed (systolic BP > 341 or diastolic BP > 95). 06/15/22  Yes Nicole Kindred A, DO  [employer] CALCIUM + D3 600-20 MG-MCG TABS TAKE 1 TABLET BY MOUTH TWICE A DAY 02/04/22  Yes Sindy Guadeloupe, MD  diclofenac Sodium (VOLTAREN) 1 % GEL Apply 1 application topically as needed. 04/27/20  Yes [provider]  Docusate Sodium 100 MG capsule Take 100 mg by mouth 2 (two) times daily as needed. 02/13/22  Yes [provider]  Incontinence Supply Disposable (BLADDER CONTROL PADS EX ABSORB) MISC Apply 1 Dose topically as needed.  03/22/18  Yes [provider]  isosorbide mononitrate (IMDUR) 60 MG 24 hr tablet Take 1 tablet (60 mg total) by mouth 2 (two) times daily. 04/20/13  Yes Minna Merritts, MD  loratadine (CLARITIN) 10 MG tablet Take 10 mg by mouth daily. 07/20/19  Yes [provider]  losartan (COZAAR) 25 MG tablet Take 1 tablet (25 mg total) by mouth daily. 08/05/21  Yes Minna Merritts, MD  methocarbamol (ROBAXIN) 500 MG tablet Take by mouth. 06/05/22  Yes [provider]  MIRALAX 17 GM/SCOOP powder SMARTSIG:2 Scoopful By Mouth Daily PRN 04/10/21  Yes [provider]  omeprazole (PRILOSEC) 40 MG capsule Take 40 mg by mouth daily. 04/13/21  Yes [provider]  oxybutynin (DITROPAN) 5 MG tablet Take 5 mg by mouth 2 (two) times daily.   Yes [provider]  potassium chloride SA (KLOR-CON) 20 MEQ tablet Take 1 tablet (20 mEq total) by mouth 2 (two) times daily. 08/05/21  Yes Gollan, Kathlene November, MD  Torsemide 40 MG TABS Take 40 mg by mouth daily in the afternoon. 03/06/22  Yes Fritzi Mandes, MD   acetaminophen-codeine (TYLENOL #3) 300-30 MG tablet Take 1-2 tablets by mouth every 4 (four) hours as needed for moderate pain. Patient not taking: Reported on 08/08/2022 06/05/22   Felipa Furnace, DPM    Review of Systems  Constitutional:  Positive for appetite change (decreased) and fatigue.  HENT:  Negative for congestion, postnasal drip and sore throat.   Eyes:  Positive for visual disturbance (cataracts).  Respiratory:  Positive for cough and shortness of breath. Negative  for wheezing.   Cardiovascular:  Positive for leg swelling. Negative for chest pain and palpitations.  Gastrointestinal:  Negative for abdominal distention and abdominal pain.  Endocrine: Negative.   Genitourinary: Negative.   Musculoskeletal:  Negative for back pain and neck pain.  Skin:  Positive for wound (on both legs).  Allergic/Immunologic: Negative.   Neurological:  Positive for dizziness and light-headedness.  Hematological:  Negative for adenopathy. Does not bruise/bleed easily.  Psychiatric/Behavioral:  Positive for sleep disturbance (chronic difficulty sleeping; sleeping on 2 pillows). Negative for dysphoric mood. The patient is nervous/anxious.    Vitals:   08/08/22 1245  BP: (!) 123/56  Pulse: 75  Resp: 18  SpO2: 94%  Weight: 243 lb (110.2 kg)   Wt Readings from Last 3 Encounters:  08/08/22 243 lb (110.2 kg)  07/21/22 247 lb (112 kg)  06/26/22 247 lb (112 kg)   Lab Results  Component Value Date   CREATININE 1.57 (H) 07/03/2022   CREATININE 1.05 (H) 06/15/2022   CREATININE 1.28 (H) 06/14/2022   Physical Exam Vitals and nursing note reviewed. Exam conducted with a chaperone present (daughter).  Constitutional:      Appearance: Normal appearance.  HENT:     Head: Normocephalic and atraumatic.  Cardiovascular:     Rate and Rhythm: Normal rate and regular rhythm.  Pulmonary:     Effort: Pulmonary effort is normal. No respiratory distress.     Breath sounds: No wheezing or rales.   Abdominal:     General: There is no distension.     Palpations: Abdomen is soft.     Tenderness: There is no abdominal tenderness.  Musculoskeletal:        General: No tenderness.     Cervical back: Normal range of motion and neck supple.     Right lower leg: Edema (wrapped in [employer]) present.     Left lower leg: Edema (wrapped in [employer]) present.  Skin:    General: Skin is warm and dry.  Neurological:     General: No focal deficit present.     Mental Status: She is alert and oriented to person, place, and time.  Psychiatric:        Mood and Affect: Mood is anxious. Mood is not depressed.    Assessment & Plan:  1: Chronic heart failure with preserved ejection fraction with structural changes (LVH/LAE)- - NYHA class III - euvolemic today - weighing when she can safely weigh; reminded to call for an overnight weight gain of > 2 pounds or a weekly weight gain of > 5 pounds - weight down 4 pounds from last visit here 1 month ago - not adding salt  - unclear of fluid intake - on GDMT of losartan - will add jardiance 51m daily - check BMP at next visit - BNP 06/13/22 was 367.7  2: HTN with CKD- - BP looks good (123/56) - saw PCP (Aycock) - BMP 07/03/22 reviewed and showed sodium 139, potassium 4.2, creatinine 1.57 and GFR 34  3: OSA- - last sleep study was done 5 years ago and she's needing new supplies so that she can start wearing it  4: Breast cancer- - saw oncology (Janese Banks 07/21/22 - currently taking arimidex  5: Lymphedema- - stage 2 - encouraged her to elevate her legs when sitting for long periods of time - legs are currently wrapped in [employer]NA boots - went to wound center 08/03/22 - limited in her ability to exercise - consider compression boots once she no  longer needs UNNA boots   Patient did not bring her medications nor a list. Each medication was verbally reviewed with the patient and she was encouraged to bring the bottles to every visit to confirm  accuracy of list.   Return in 1 month, sooner if needed.

## 2022-08-09 ENCOUNTER — Encounter: Payer: Self-pay | Admitting: Family

## 2022-08-10 ENCOUNTER — Other Ambulatory Visit: Payer: Self-pay | Admitting: Cardiovascular Disease

## 2022-08-11 ENCOUNTER — Telehealth: Payer: Self-pay | Admitting: *Deleted

## 2022-08-11 NOTE — Telephone Encounter (Addendum)
Reschedule bx of thyroid. Spoke to daughter and she said that 12/15/20223 [facility]. Daughter wanted to know if she going to be put to sleep and the answer is no. They use ice and 1 inj. And that is it. I just . I told her that pt missed the first one and it is important to keep the appt. and if you can't make it to call me at [phone]  Also I sent a fax to ENT for referral for thyroid nodules that is being biopsied on 12/15.

## 2022-08-11 NOTE — Progress Notes (Incomplete)
08/12/2022 3:49 PM   [name] October 29, 1945 [id number]  Referring provider: Donnie Coffin, MD [place],  [place] [address]  No chief complaint on file.   HPI:    PMH: Past Medical History:  Diagnosis Date   Abnormality of gait    Acute respiratory failure with hypoxia ([place]) 06/13/2022   Breast cancer ([place]) 02/05/2005   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    Breast cancer ([place]) 2019   right breast   CHF (congestive heart failure) ([place])    March 28, 2017 echo: EF 50-55%; 1) echo 07/2011: EF 20-25%, mild concentric hypertrophy, diffuse HK, regional WMA cannot be excluded, grade 1 DD, mitral valve with mild regurg, LA mildly dilated). 2) EF 40-45%, mild AS,                     Chronic kidney disease    Complication of anesthesia 2006   pt states "hard to wake up" after L breast surgery    Edema    GERD (gastroesophageal reflux disease)    Headache    Heart murmur    Hypertension    Hypokinesis    global, EF 35-45 % from Echo.   Malignant neoplasm of breast (female), unspecified site 06/14/2018   RIGHT T1b, N0; ER/PR positive, HER-2 negative.   Neuropathy    Obesity    Pain in joint, site unspecified    Personal history of chemotherapy 2006   left breast   Personal history of radiation therapy 2019   right breast   Personal history of radiation therapy 2006   left breast   Pneumonia    Sleep apnea    Tinea pedis    Unspecified hearing loss    bilateral    Surgical History: Past Surgical History:  Procedure Laterality Date   BREAST BIOPSY Right 2019   11 oc-INVASIVE MAMMARY CARCINOMA, NO SPECIAL TYPE   BREAST BIOPSY Right 2019   1030 oc- neg   BREAST LUMPECTOMY  2006   left breast    CATARACT EXTRACTION     left eye   HERNIA REPAIR  05/25/2006   Incarcerated omentum in ventral hernia, Ventralight mesh, combined lap/ open with omental resection.     PARTIAL MASTECTOMY WITH NEEDLE LOCALIZATION Right 06/14/2018   Procedure: PARTIAL MASTECTOMY WITH NEEDLE LOCALIZATION;  Surgeon: Robert Bellow, MD;  Location: [facility] ORS;  Service: General;  Laterality: Right;   PORT-A-CATH REMOVAL  05/25/2006   SENTINEL NODE BIOPSY Right 06/14/2018   Procedure: SENTINEL NODE BIOPSY;  Surgeon: Robert Bellow, MD;  Location: [facility] OR[facility];  Service: General;  Laterality: Right;   TUBAL LIGATION      Home Medications:  Allergies as of 08/12/2022   No Known Allergies      Medication List        Accurate as of August 11, 2022  3:49 PM. If you have any questions, ask your nurse or doctor.          acetaminophen-codeine 300-30 MG tablet Commonly known as: TYLENOL #3 Take 1-2 tablets by mouth every 4 (four) hours as needed for moderate pain.   anastrozole 1 MG tablet Commonly known as: ARIMIDEX TAKE 1 TABLET BY MOUTH ONCE DAILY. PTAIENT MUST MAKE AN APPOINTMENT TO SEE DR RAO   aspirin EC 81 MG tablet Take 81 mg by mouth daily.   Bladder Control Pads Ex Absorb Misc Apply 1 Dose  topically as needed.   cloNIDine 0.1 MG tablet Commonly known as: CATAPRES Take 1 tablet (0.1 mg total) by mouth 2 (two) times daily as needed (systolic BP > 638 or diastolic BP > 95).   [employer]S Calcium + D3 600-20 MG-MCG Tabs Generic drug: Calcium Carb-Cholecalciferol TAKE 1 TABLET BY MOUTH TWICE A DAY   diclofenac Sodium 1 % Gel Commonly known as: VOLTAREN Apply 1 application topically as needed.   Docusate Sodium 100 MG capsule Take 100 mg by mouth 2 (two) times daily as needed.   empagliflozin 10 MG Tabs tablet Commonly known as: Jardiance Take 1 tablet (10 mg total) by mouth daily before breakfast.   isosorbide mononitrate 60 MG 24 hr tablet Commonly known as: Imdur Take 1 tablet (60 mg total) by mouth 2 (two) times daily.   loratadine 10 MG tablet Commonly known as: CLARITIN Take 10 mg by mouth daily.   losartan 25 MG tablet Commonly known as:  COZAAR Take 1 tablet (25 mg total) by mouth daily.   methocarbamol 500 MG tablet Commonly known as: ROBAXIN Take by mouth.   MiraLax 17 GM/SCOOP powder Generic drug: polyethylene glycol powder SMARTSIG:2 Scoopful By Mouth Daily PRN   omeprazole 40 MG capsule Commonly known as: PRILOSEC Take 40 mg by mouth daily.   oxybutynin 5 MG tablet Commonly known as: DITROPAN Take 5 mg by mouth 2 (two) times daily.   potassium chloride SA 20 MEQ tablet Commonly known as: KLOR-CON M Take 1 tablet (20 mEq total) by mouth 2 (two) times daily.   Torsemide 40 MG Tabs Take 40 mg by mouth daily in the afternoon.        Allergies: No Known Allergies  Family History: Family History  Problem Relation Age of Onset   Hypertension Mother        deceased 99   Cataracts Mother    Breast cancer Other 40       maternal half-sister; deceased 1   Coronary artery disease Other     Social History:  reports that she quit smoking about 22 years ago. Her smoking use included cigarettes. She has a 15.00 pack-year smoking history. She has never used smokeless tobacco. She reports that she does not currently use drugs after having used the following drugs: Marijuana. She reports that she does not drink alcohol.   Physical Exam: There were no vitals taken for this visit.  Constitutional:  Alert and oriented, No acute distress. HEENT: [place] AT, moist mucus membranes.  Trachea midline, no masses. Cardiovascular: No clubbing, cyanosis, or edema. Respiratory: Normal respiratory effort, no increased work of breathing. GI: Abdomen is soft, nontender, nondistended, no abdominal masses GU: No CVA tenderness Skin: No rashes, bruises or suspicious lesions. Neurologic: Grossly intact, no focal deficits, moving all 4 extremities. Psychiatric: Normal mood and affect.  Laboratory Data: Lab Results  Component Value Date   WBC 8.4 06/13/2022   HGB 11.7 (L) 06/13/2022   HCT 37.4 06/13/2022   MCV 87.6 06/13/2022    PLT 334 06/13/2022    Lab Results  Component Value Date   CREATININE 1.57 (H) 07/03/2022    No results found for: "PSA"  No results found for: "TESTOSTERONE"  Lab Results  Component Value Date   HGBA1C 5.8 (H) 03/28/2017    Urinalysis    Component Value Date/Time   COLORURINE YELLOW (A) 03/03/2022 0208   APPEARANCEUR CLEAR (A) 03/03/2022 0208   APPEARANCEUR Clear 03/22/2014 1620   LABSPEC 1.023 03/03/2022 0208   LABSPEC 1.020  03/22/2014 1620   PHURINE 5.0 03/03/2022 0208   GLUCOSEU NEGATIVE 03/03/2022 0208   GLUCOSEU Negative 03/22/2014 1620   HGBUR NEGATIVE 03/03/2022 0208   BILIRUBINUR NEGATIVE 03/03/2022 0208   BILIRUBINUR Negative 03/22/2014 1620   KETONESUR NEGATIVE 03/03/2022 0208   PROTEINUR 30 (A) 03/03/2022 0208   NITRITE NEGATIVE 03/03/2022 0208   LEUKOCYTESUR LARGE (A) 03/03/2022 0208   LEUKOCYTESUR Negative 03/22/2014 1620    Lab Results  Component Value Date   BACTERIA NONE SEEN 03/03/2022    Pertinent Imaging: *** No results found for this or any previous visit.  No results found for this or any previous visit.  No results found for this or any previous visit.  No results found for this or any previous visit.  No results found for this or any previous visit.  No valid procedures specified. No results found for this or any previous visit.  No results found for this or any previous visit.   Assessment & Plan:    There are no diagnoses linked to this encounter.  No follow-ups on file.  Hollice Espy, MD  [facility] [address], [place], [place] [address] [phone]

## 2022-08-11 NOTE — Telephone Encounter (Signed)
Needs appointment for 90 day refills. Thank you!

## 2022-08-12 ENCOUNTER — Ambulatory Visit: Payer: Self-pay | Admitting: Urology

## 2022-08-12 ENCOUNTER — Encounter: Payer: Medicare Other | Attending: Physician Assistant | Admitting: Physician Assistant

## 2022-08-12 DIAGNOSIS — L97812 Non-pressure chronic ulcer of other part of right lower leg with fat layer exposed: Secondary | ICD-10-CM | POA: Insufficient documentation

## 2022-08-12 DIAGNOSIS — I87332 Chronic venous hypertension (idiopathic) with ulcer and inflammation of left lower extremity: Secondary | ICD-10-CM | POA: Insufficient documentation

## 2022-08-12 DIAGNOSIS — I890 Lymphedema, not elsewhere classified: Secondary | ICD-10-CM | POA: Insufficient documentation

## 2022-08-12 NOTE — Progress Notes (Signed)
[name] ([id number]) [id number] Page 1 of 10 Visit Report for 08/12/2022 Arrival Information Details Patient Name: Date of Service: [name] 08/12/2022 2:45 PM Medical Record Number: [id number] Patient Account Number: [id number] Date of Birth/Sex: Treating RN: 09-04-1946 (76 y.o. [name] Primary Care Kobe Jansma: [name] Other Clinician: Referring Lakeyta Vandenheuvel: Treating Tamatha Gadbois/Extender: Mayer Masker, Ngwe Weeks in Treatment: 59 Visit Information History Since Last Visit All ordered tests and consults were completed: No Patient Arrived: Wheel Chair Added or deleted any medications: No Arrival Time: 15:07 Any new allergies or adverse reactions: No Accompanied By: self Had a fall or experienced change in No Transfer Assistance: None activities of daily living that may affect Patient Identification Verified: Yes risk of falls: Secondary Verification Process Completed: Yes Signs or symptoms of abuse/neglect since last visito No Patient Requires Transmission-Based Precautions: No Hospitalized since last visit: No Patient Has Alerts: Yes Implantable device outside of the clinic excluding No Patient Alerts: NOT DIABETIC cellular tissue based products placed in the center since last visit: Has Dressing in Place as Prescribed: Yes Has Compression in Place as Prescribed: Yes Pain Present Now: No Electronic Signature(s) Unsigned Entered ByCarlene Coria on 08/12/2022 15:22:00 -------------------------------------------------------------------------------- Clinic Level of Care Assessment Details Patient Name: Date of Service: [name] 08/12/2022 2:45 PM Medical Record Number: [id number] Patient Account Number: [id number] Date of Birth/Sex: Treating RN: Jun 10, 1946 (76 y.o. [name] Primary Care Wynter Isaacs: [name] Other Clinician: Referring Fate Galanti: Treating Ivry Pigue/Extender: Mayer Masker,  Ngwe Weeks in Treatment: 42 Clinic Level of Care Assessment Items TOOL 1 Quantity Score '[]'$  - 0 Use when EandM and Procedure is performed on INITIAL visit ASSESSMENTS - Nursing Assessment / Reassessment KANETRA, HO ([id number]) [id number] Page 2 of 10 '[]'$  - 0 General Physical Exam (combine w/ comprehensive assessment (listed just below) when performed on new pt. evals) '[]'$  - 0 Comprehensive Assessment (HX, ROS, Risk Assessments, Wounds Hx, etc.) ASSESSMENTS - Wound and Skin Assessment / Reassessment '[]'$  - 0 Dermatologic / Skin Assessment (not related to wound area) ASSESSMENTS - Ostomy and/or Continence Assessment and Care '[]'$  - 0 Incontinence Assessment and Management '[]'$  - 0 Ostomy Care Assessment and Management (repouching, etc.) PROCESS - Coordination of Care '[]'$  - 0 Simple Patient / Family Education for ongoing care '[]'$  - 0 Complex (extensive) Patient / Family Education for ongoing care '[]'$  - 0 Staff obtains Programmer, systems, Records, T Results / Process Orders est '[]'$  - 0 Staff telephones HHA, Nursing Homes / Clarify orders / etc '[]'$  - 0 Routine Transfer to another Facility (non-emergent condition) '[]'$  - 0 Routine Hospital Admission (non-emergent condition) '[]'$  - 0 New Admissions / Biomedical engineer / Ordering NPWT Apligraf, etc. , '[]'$  - 0 Emergency Hospital Admission (emergent condition) PROCESS - Special Needs '[]'$  - 0 Pediatric / Minor Patient Management '[]'$  - 0 Isolation Patient Management '[]'$  - 0 Hearing / Language / Visual special needs '[]'$  - 0 Assessment of Community assistance (transportation, D/C planning, etc.) '[]'$  - 0 Additional assistance / Altered mentation '[]'$  - 0 Support Surface(s) Assessment (bed, cushion, seat, etc.) INTERVENTIONS - Miscellaneous '[]'$  - 0 External ear exam '[]'$  - 0 Patient Transfer (multiple staff / Civil Service fast streamer / Similar devices) '[]'$  - 0 Simple Staple / Suture removal (25 or less) '[]'$  - 0 Complex Staple / Suture  removal (26 or more) '[]'$  - 0 Hypo/Hyperglycemic Management (do not check if billed separately) '[]'$  - 0 Ankle / Brachial Index (ABI) - do not check if billed  separately Has the patient been seen at the hospital within the last three years: Yes Total Score: 0 Level Of Care: ____ Electronic Signature(s) Unsigned Entered By: Carlene Coria on 08/12/2022 16:08:17 -------------------------------------------------------------------------------- Compression Therapy Details Patient Name: Date of Service: [name] 08/12/2022 2:45 PM [name] ([id number]) [id number] Page 3 of 10 Medical Record Number: [id number] Patient Account Number: [id number] Date of Birth/Sex: Treating RN: 12-Aug-1946 (76 y.o. [name] Primary Care Toua Stites: [name] Other Clinician: Referring Mackson Botz: Treating Lavera Vandermeer/Extender: Mayer Masker, Ngwe Weeks in Treatment: 47 Compression Therapy Performed for Wound Assessment: Wound #4 Right,Lateral Lower Leg Performed By: Clinician Carlene Coria, RN Compression Type: Three Layer Post Procedure Diagnosis Same as Pre-procedure Electronic Signature(s) Signed: 08/12/2022 4:07:00 PM By: Carlene Coria RN Entered By: Carlene Coria on 08/12/2022 16:06:59 -------------------------------------------------------------------------------- Compression Therapy Details Patient Name: Date of Service: [name] 08/12/2022 2:45 PM Medical Record Number: [id number] Patient Account Number: [id number] Date of Birth/Sex: Treating RN: 11/24/1945 (76 y.o. [name] Primary Care Zaliah Wissner: [name] Other Clinician: Referring Ernesto Lashway: Treating Blong Busk/Extender: Mayer Masker, Ngwe Weeks in Treatment: 76 Compression Therapy Performed for Wound Assessment: Wound #6 Left,Medial Lower Leg Performed By: Clinician Carlene Coria, RN Compression Type: Three Layer Post Procedure Diagnosis Same as Pre-procedure Electronic  Signature(s) Signed: 08/12/2022 4:07:34 PM By: Carlene Coria RN Entered By: Carlene Coria on 08/12/2022 16:07:34 -------------------------------------------------------------------------------- Encounter Discharge Information Details Patient Name: Date of Service: [name] 08/12/2022 2:45 PM Medical Record Number: [id number] Patient Account Number: [id number] Date of Birth/Sex: Treating RN: [birth year] (76 y.o. [name] Primary Care Nikeshia Keetch: [name] Other Clinician: Referring Lyza Houseworth: Treating Dalyla Chui/Extender: Mayer Masker, Ngwe Weeks in Treatment: 67 Encounter Discharge Information Items THANVI, BLINCOE ([id number]) [id number] Page 4 of 10 Discharge Condition: Stable Ambulatory Status: Wheelchair Discharge Destination: Home Transportation: Private Auto Accompanied By: self Schedule Follow-up Appointment: Yes Clinical Summary of Care: Electronic Signature(s) Signed: 08/12/2022 4:09:43 PM By: Carlene Coria RN Entered By: Carlene Coria on 08/12/2022 16:09:43 -------------------------------------------------------------------------------- Lower Extremity Assessment Details Patient Name: Date of Service: [name] 08/12/2022 2:45 PM Medical Record Number: [id number] Patient Account Number: [id number] Date of Birth/Sex: Treating RN: 09/06/1946 (76 y.o. [name] Primary Care Javon Snee: [name] Other Clinician: Referring Markail Diekman: Treating Justis Closser/Extender: Mayer Masker, Ngwe Weeks in Treatment: 41 Vascular Assessment Pulses: Dorsalis Pedis Palpable: [Left:Yes] [Right:Yes] Electronic Signature(s) Unsigned Entered ByCarlene Coria on 08/12/2022 15:24:11 -------------------------------------------------------------------------------- Multi Wound Chart Details Patient Name: Date of Service: [name] 08/12/2022 2:45 PM Medical Record Number: [id number] Patient Account Number:  [id number] Date of Birth/Sex: Treating RN: 05/12/1946 (76 y.o. [name] Primary Care Tasheika Kitzmiller: [name] Other Clinician: Referring Zeynab Klett: Treating Chanta Bauers/Extender: Mayer Masker, Ngwe Weeks in Treatment: 36 Vital Signs Height(in): 69 Pulse(bpm): 64 Weight(lbs): 244 Blood Pressure(mmHg): 156/81 Body Mass Index(BMI): 36 Kazee, Chariah ([id number]) [id number] Page 5 of 10 Temperature(F): 98.6 Respiratory Rate(breaths/min): 18 [4:Photos:] [N/A:N/A] Right, Lateral Lower Leg Left, Medial Lower Leg N/A Wound Location: Gradually Appeared Blister N/A Wounding Event: Venous Leg Ulcer Lymphedema N/A Primary Etiology: Cataracts, Lymphedema, Sleep Cataracts, Lymphedema, Sleep N/A Comorbid History: Apnea, Congestive Heart Failure, Apnea, Congestive Heart Failure, Hypertension, Osteoarthritis, Hypertension, Osteoarthritis, Neuropathy, Received Chemotherapy, Neuropathy, Received Chemotherapy, Received Radiation Received Radiation 10/27/2021 07/09/2022 N/A Date Acquired: 41 3 N/A Weeks of Treatment: Open Open N/A Wound Status: No No N/A Wound Recurrence: 2.5x1.7x0.1 9x6x0.1 N/A Measurements L x W x D (cm) 3.338 42.412 N/A A (cm) :  rea 0.334 4.241 N/A Volume (cm) : -183.40% 50.90% N/A % Reduction in Area: -183.10% 50.90% N/A % Reduction in Volume: Full Thickness Without Exposed Full Thickness Without Exposed N/A Classification: Support Structures Support Structures Medium Medium N/A Exudate Amount: Serosanguineous Serosanguineous N/A Exudate Type: red, brown red, brown N/A Exudate Color: Medium (34-66%) None Present (0%) N/A Granulation Amount: Pink N/A N/A Granulation Quality: Medium (34-66%) None Present (0%) N/A Necrotic Amount: Fat Layer (Subcutaneous Tissue): Yes Fascia: No N/A Exposed Structures: Fascia: No Fat Layer (Subcutaneous Tissue): No Tendon: No Tendon: No Muscle: No Muscle: No Joint: No Joint:  No Bone: No Bone: No None None N/A Epithelialization: Treatment Notes Electronic Signature(s) Unsigned Entered ByCarlene Coria on 08/12/2022 15:24:16 -------------------------------------------------------------------------------- Multi-Disciplinary Care Plan Details Patient Name: Date of Service: [name] 08/12/2022 2:45 PM Medical Record Number: [id number] Patient Account Number: [id number] Date of Birth/Sex: Treating RN: [birth year] (76 y.o. [name] Primary Care Morghan Kester: [name] Other Clinician: Referring Zaccheus Edmister: Treating Garmon Dehn/Extender: Mayer Masker, Ngwe Weeks in Treatment: [address] [place] ([id number]) [id number] Page 6 of 10 Active Inactive Electronic Signature(s) Signed: 08/12/2022 4:08:52 PM By: Carlene Coria RN Entered By: Carlene Coria on 08/12/2022 16:08:51 -------------------------------------------------------------------------------- Pain Assessment Details Patient Name: Date of Service: [name] 08/12/2022 2:45 PM Medical Record Number: [id number] Patient Account Number: [id number] Date of Birth/Sex: Treating RN: Apr 20, 1946 (76 y.o. [name] Primary Care Kevaughn Ewing: [name] Other Clinician: Referring Kahleb Mcclane: Treating Taiwo Fish/Extender: Mayer Masker, Ngwe Weeks in Treatment: 84 Active Problems Location of Pain Severity and Description of Pain Patient Has Paino No Site Locations Pain Management and Medication Current Pain Management: Electronic Signature(s) Unsigned Entered ByCarlene Coria on 08/12/2022 15:22:33 Signature(s): [name] ([id number]) [id number] Date(s): [id number] Page 7 of 10 -------------------------------------------------------------------------------- Patient/Caregiver Education Details Patient Name: Date of Service: [name] 12/5/2023andnbsp2:45 PM Medical Record Number: [id number] Patient Account Number:  [id number] Date of Birth/Gender: Treating RN: 13-Jul-1946 (76 y.o. [name] Primary Care Physician: [name] Other Clinician: Referring Physician: Treating Physician/Extender: Alto [place] Weeks in Treatment: 26 Education Assessment Education Provided To: Patient Education Topics Provided Wound/Skin Impairment: Methods: Explain/Verbal Responses: State content correctly Electronic Signature(s) Unsigned Entered By: Carlene Coria on 08/12/2022 16:08:42 -------------------------------------------------------------------------------- Wound Assessment Details Patient Name: Date of Service: [name] 08/12/2022 2:45 PM Medical Record Number: [id number] Patient Account Number: [id number] Date of Birth/Sex: Treating RN: 27-Oct-1945 (76 y.o. [name] Primary Care Raziya Aveni: [name] Other Clinician: Referring Izack Hoogland: Treating Charlea Nardo/Extender: Mayer Masker, Ngwe Weeks in Treatment: 21 Wound Status Wound Number: 4 Primary Venous Leg Ulcer Etiology: Wound Location: Right, Lateral Lower Leg Wound Open Wounding Event: Gradually Appeared Status: Date Acquired: 10/27/2021 Comorbid Cataracts, Lymphedema, Sleep Apnea, Congestive Heart Failure, Weeks Of Treatment: 41 History: Hypertension, Osteoarthritis, Neuropathy, Received Chemotherapy, Clustered Wound: No Received Radiation Photos [place], [name] ([id number]) [id number] Page 8 of 10 Wound Measurements Length: (cm) 2.5 Width: (cm) 1.7 Depth: (cm) 0.1 Area: (cm) 3.338 Volume: (cm) 0.334 % Reduction in Area: -183.4% % Reduction in Volume: -183.1% Epithelialization: None Tunneling: No Undermining: No Wound Description Classification: Full Thickness Without Exposed Support Structures Exudate Amount: Medium Exudate Type: Serosanguineous Exudate Color: red, brown Foul Odor After Cleansing: No Slough/Fibrino Yes Wound Bed Granulation Amount: Medium  (34-66%) Exposed Structure Granulation Quality: Pink Fascia Exposed: No Necrotic Amount: Medium (34-66%) Fat Layer (Subcutaneous Tissue) Exposed: Yes Necrotic Quality: Adherent Slough Tendon Exposed: No Muscle Exposed: No Joint Exposed: No Bone Exposed: No  Treatment Notes Wound #4 (Lower Leg) Wound Laterality: Right, Lateral Cleanser Soap and Water Discharge Instruction: Gently cleanse wound with antibacterial soap, rinse and pat dry prior to dressing wounds Wound Cleanser Discharge Instruction: Wash your hands with soap and water. Remove old dressing, discard into plastic bag and place into trash. Cleanse the wound with Wound Cleanser prior to applying a clean dressing using gauze sponges, not tissues or cotton balls. Do not scrub or use excessive force. Pat dry using gauze sponges, not tissue or cotton balls. Peri-Wound Care Topical Primary Dressing Silvercel Small 2x2 (in/in) Discharge Instruction: Apply Silvercel Small 2x2 (in/in) as instructed Secondary Dressing Secured With Compression Wrap 3-LAYER WRAP - Profore Lite LF 3 Multilayer Compression Bandaging System Discharge Instruction: Apply 3 multi-layer wrap as prescribed. Compression Stockings Add-Ons Electronic Signature(s) Unsigned Entered ByCarlene Coria on 08/12/2022 15:23:25 Signature(s): ESLY, SELVAGE ([id number]) [id number] Date(s): _[id number]_[id number].pdf Page 9 of 10 -------------------------------------------------------------------------------- Wound Assessment Details Patient Name: Date of Service: [name] 08/12/2022 2:45 PM Medical Record Number: [id number] Patient Account Number: [id number] Date of Birth/Sex: Treating RN: July 14, 1946 (76 y.o. [name] Primary Care Annisha Baar: [name] Other Clinician: Referring Lovetta Condie: Treating Gustavia Carie/Extender: Mayer Masker, Ngwe Weeks in Treatment: 83 Wound Status Wound Number: 6 Primary Lymphedema Etiology: Wound Location:  Left, Medial Lower Leg Wound Open Wounding Event: Blister Status: Date Acquired: 07/09/2022 Comorbid Cataracts, Lymphedema, Sleep Apnea, Congestive Heart Failure, Weeks Of Treatment: 3 History: Hypertension, Osteoarthritis, Neuropathy, Received Chemotherapy, Clustered Wound: No Received Radiation Photos Wound Measurements Length: (cm) 9 Width: (cm) 6 Depth: (cm) 0.1 Area: (cm) 42.412 Volume: (cm) 4.241 % Reduction in Area: 50.9% % Reduction in Volume: 50.9% Epithelialization: None Tunneling: No Undermining: No Wound Description Classification: Full Thickness Without Exposed Support Structures Exudate Amount: Medium Exudate Type: Serosanguineous Exudate Color: red, brown Foul Odor After Cleansing: No Slough/Fibrino No Wound Bed Granulation Amount: None Present (0%) Exposed Structure Necrotic Amount: None Present (0%) Fascia Exposed: No Fat Layer (Subcutaneous Tissue) Exposed: No Tendon Exposed: No Muscle Exposed: No Joint Exposed: No Bone Exposed: No Treatment Notes Wound #6 (Lower Leg) Wound Laterality: Left, Medial Cleanser Soap and Water Discharge Instruction: Gently cleanse wound with antibacterial soap, rinse and pat dry prior to dressing wounds Wound Cleanser Wallace, Williemae ([id number]) [id number] Page 10 of 10 Discharge Instruction: [facility] your hands with soap and water. Remove old dressing, discard into plastic bag and place into trash. Cleanse the wound with Wound Cleanser prior to applying a clean dressing using gauze sponges, not tissues or cotton balls. Do not scrub or use excessive force. Pat dry using gauze sponges, not tissue or cotton balls. Peri-Wound Care Topical Primary Dressing Silvercel 4 1/4x 4 1/4 (in/in) Discharge Instruction: Apply Silvercel 4 1/4x 4 1/4 (in/in) as instructed Secondary Dressing ABD Pad 5x9 (in/in) Discharge Instruction: Cover with ABD pad Secured With Compression Wrap 3-LAYER WRAP - Profore  Lite LF 3 Multilayer Compression Bandaging System Discharge Instruction: Apply 3 multi-layer wrap as prescribed. Compression Stockings Add-Ons Electronic Signature(s) Unsigned Entered By: Carlene Coria on 08/12/2022 15:23:46 -------------------------------------------------------------------------------- Vitals Details Patient Name: Date of Service: [name] 08/12/2022 2:45 PM Medical Record Number: [id number] Patient Account Number: [id number] Date of Birth/Sex: Treating RN: Aug 11, 1946 (76 y.o. [name] Primary Care Kit Brubacher: [name] Other Clinician: Referring Paysen Goza: Treating Ramil Edgington/Extender: Mayer Masker, Ngwe Weeks in Treatment: 39 Vital Signs Time Taken: 15:22 Temperature (F): 98.6 Height (in): 69 Pulse (bpm): 64 Weight (lbs): 244 Respiratory Rate (breaths/min): 18 Body Mass Index (BMI):  36 Blood Pressure (mmHg): 156/81 Reference Range: 80 - 120 mg / dl Electronic Signature(s) Unsigned Entered By: Carlene Coria on 08/12/2022 15:22:20 Signature(s): Date(s):

## 2022-08-12 NOTE — Progress Notes (Signed)
[name], [name] ([id number]) [id number] Page 1 of 13 Visit Report for 08/12/2022 Chief Complaint Document Details Patient Name: Date of Service: [name] 08/12/2022 2:45 PM Medical Record Number: [id number] Patient Account Number: [id number] Date of Birth/Sex: Treating RN: December 22, 1945 (76 y.o. [name] Primary Care Provider: [name] Other Clinician: Referring Provider: Treating Provider/Extender: Mayer Masker, Ngwe Weeks in Treatment: 66 Information Obtained from: Patient Chief Complaint Bilateral LE Ulcers Electronic Signature(s) Signed: 08/12/2022 3:10:10 PM By: Worthy Keeler PA-C Entered By: Worthy Keeler on 08/12/2022 15:10:10 -------------------------------------------------------------------------------- HPI Details Patient Name: Date of Service: [name] 08/12/2022 2:45 PM Medical Record Number: [id number] Patient Account Number: [id number] Date of Birth/Sex: Treating RN: Apr 21, 1946 (76 y.o. [name] Primary Care Provider: [name] Other Clinician: Referring Provider: Treating Provider/Extender: Mayer Masker, Ngwe Weeks in Treatment: 46 History of Present Illness HPI Description: 03/15/2020 upon evaluation today patient presents today for initial evaluation here in our clinic concerning a wound that is on the left posterior lower extremity. Unfortunately this has been giving the patient some discomfort at this point which she notes has been affected her pretty much daily. The patient does have a history of venous insufficiency, hypertension, congestive heart failure, and a history of breast cancer. Fortunately there does not appear to be evidence of active infection at this time which is great news. No fevers, chills, nausea, vomiting, or diarrhea. Wound is not extremely large but does have some slough covering the surface of the wound. This is going require sharp debridement today. 03/15/2020  upon evaluation today patient appears to be doing fairly well in regard to her wound. She does have some slough noted on the surface of the wound currently but I do believe the Iodoflex has been beneficial over the past week. There is no signs of active infection at this time which is great news and overall very pleased with the progress. No fevers, chills, nausea, vomiting, or diarrhea. 04/02/2020 upon evaluation today patient appears to be doing a little better in regard to her wound size wise this is not tremendously smaller she does have some slough buildup on the surface of the wound. With that being said this is going require some sharp debridement today to clear away some of the necrotic debris. Fortunately there is no evidence of active infection at this time. No fevers, chills, nausea, vomiting, or diarrhea. 04/10/2020 on evaluation today patient appears to be doing well with regard to her ulcer which is measuring somewhat smaller today. She actually has more granulation tissue noted which is also good news is no need for sharp debridement today. Fortunately there is no evidence of infection either which is also excellent. No fevers, chills, nausea, vomiting, or diarrhea. [name], [name] ([id number]) [id number] Page 2 of 13 04/26/2020 on evaluation today patient's wound actually is showing signs of good improvement which is great news. There does not appear to be any evidence of active infection. I do believe she is tolerating the collagen at this point. 05/03/2020 upon evaluation today patient's wound actually showed signs of good granulation at this time there does not appear to be any evidence of active infection which is great news and overall very pleased with where things stand. With that being said I do believe that the patient is can require some sharp debridement today but fortunately nothing too significant. 05/11/20 on evaluation today patient appears to be  doing well in regard to her leg ulcer. She's been tolerating  the dressing changes without complication. Fortunately there is no signs of active infection at this time. Overall I'm very pleased with where things stand. 05/18/2020 upon evaluation today patient's wound is showing signs of improvement is very dry and I think the collagen for the most part just dried to the wound bed. I am going to clean this away with sharp debridement in order to get this under control in my opinion. 05/25/2020 upon evaluation today patient actually appears to be doing well in regard to her leg ulcer. In fact upon inspection it appears she could potentially be completely healed but that is not guaranteed based on what we are seeing. There does not appear to be any signs of active infection which is great news. 05/31/2020 on evaluation today patient appears to be doing well with regard to her wounds. She in fact appears to be almost completely healed on the left leg there is just a very small opening remaining point 1 06/08/2020 upon evaluation today patient appears to be doing excellent in regard to her leg ulcer on the left in fact it appears to be that she is completely healed as of today. I see no signs of active infection at this time which is great news. No fevers, chills, nausea, vomiting, or diarrhea. READMISSION 01/16/2021 Patient that we have had in this clinic with a wound on the left posterior calf in the summer 2021 extending into October. She was felt to have chronic venous insufficiency. Per the patient's description she was discharged with what sounds like external compression stockings although she could not afford the "$75 per leg". She therefore did not get anything. Apparently her wound that she has currently is been in existence since January. She has been followed at vein and vascular with Unna boots and I think calcium alginate. She had ABIs done on 06/25/2020 that showed an noncompressible ABI on the  right leg and the left leg but triphasic waveforms with good great toe pressures and a TBI of 0.90 on the right and 0.91 on the left Surprisingly the patient also had venous reflux studies that were really unremarkable. This included no evidence of DVT or SVT but there was no evidence of s s deep venous insufficiency or superficial venous insufficiency in the greater saphenous or short saphenous veins bilaterally. I would wonder about more central venous issues or perhaps this is all lymphedema 01/25/2021 upon evaluation today patient appears to be doing decently well and guard to her wound. The good news is the Iodoflex that is job she saw Dr. Dellia Nims last week for readmission and to be honest I think that this has done extremely well over that week. I do believe that she can tolerate the sharp debridement today which will be good. I think that cleaning off the wound will likely allow [place] to be able to use a different type of dressing I do not think the Iodoflex will even be necessary based on how clean the wound looks. Fortunately there is no sign of active infection at this time which is great news. No fevers, chills, nausea, vomiting, or diarrhea. 02/12/2021 upon evaluation today patient appears to be doing well with regard to her leg ulcer. We did switch to alginate when she came for nurse visit I think is doing much better for her. Fortunately there does not appear to be any signs of active infection at this time which is great news. No fevers, chills, nausea, vomiting, or diarrhea. 02/18/2021 upon evaluation today patient's wound  is actually showing signs of good improvement. I am very pleased with where things stand currently. I do not see any signs of active infection which is great news and overall I feel like she is making good progress. The patient likewise is happy that things are doing so well and that this is measuring somewhat smaller. 02/25/2021 upon evaluation today patient actually  appears to be doing quite well in regard to her wounds. She has been tolerating the dressing changes without complication. Fortunately there does not appear to be any signs of active infection which is great news and overall I am extremely pleased with where things stand. No fevers, chills, nausea, vomiting, or diarrhea. She does have a lot of edema I really do think she needs compression socks to be worn in order to prevent things from causing additional issues for her currently. Especially on the right leg she should be wearing compression socks all the time to be honest. 03/04/2021 upon evaluation today patient's leg ulcer actually appears to be doing pretty well which is great news. There does not appear to be any significant change but overall the appearance is better even though the size is not necessarily reflecting a great improvement. Fortunately there does not appear to be any signs of active infection. No fevers, chills, nausea, vomiting, or diarrhea. 03/12/2021 upon evaluation today patient appears to be doing well with regard to her wounds. She has been tolerating the dressing changes without complication. The good news is that the wound on her left lateral leg is doing much better and is showing signs of good improvement. Overall her swelling is controlled well as well. She does need some compression socks she plans to go Jefferey socks next week. 03/18/2021 on evaluation today patient's wound actually is showing signs of good improvement. She does have a little bit of slough this can require some sharp debridement today. 03/25/2021 upon evaluation today patient appears to be doing well with regard to her wound on the left lateral leg. She has been tolerating the dressing changes without complication. Fortunately there does not appear to be any signs of active infection at this time. No fever chills no 04/01/2021 upon evaluation today patient's wound is showing signs of improvement and overall  very pleased with where things stand at this point. There is no evidence of active infection which is great news as well and in general I think that she is making great progress. 04/09/2021 upon evaluation today patient appears to be doing well with regard to her ankle ulcer. She is making good progress currently which is great news. There does not appear to be any signs of infection also excellent news. In general I am extremely pleased with where things stand today. No fevers, chills, nausea, vomiting, or diarrhea. 04/23/2021 upon inspection today patient appears to be doing quite well with regard to her leg ulcer. She has been tolerating the dressing changes without complication. She is can require some sharp debridement today but overall seems to be doing excellent. 05/06/2021 upon evaluation today patient unfortunately appears to be doing poorly overall in regard to her swelling. She is actually significantly more swollen than last week despite the fact that her wound is doing better this is not a good trend. I think that she probably needs to see her cardiologist ASAP and I discussed that with her today. This includes the fact that honestly I think she probably is getting need an increase in her diuretic or something to try to clear  away some of the excess fluid that she is experiencing at the moment. She is not been doing her pumps even twice a day but again right now more concerned that if she were to do the pump she would fluid overload her lungs causing other issues as far as her breathing is concerned. Obviously I think she needs to contact them and move up the appointment for the 12th of something sooner 2 weeks is a bit too long to wait with how swollen she has. 05/14/2021 upon evaluation today patient's wound actually showing signs of being a little bit smaller compared to previous. She has been making good progress however. Fortunately there does not appear to be any evidence of active  infection which is great. Overall I am extremely pleased in that regard. Nonetheless I am still concerned that she is having quite a bit of issue as far as the swelling is concerned she has been placed on fluid restriction as well as an increase in the furosemide by her doctor. We will see how things progress. 05/21/2021 upon evaluation today patient appears to be doing well with regard to her wound in general. She has been tolerating the dressing changes without VAYLA, WILHELMI ([id number]) [id number] Page 3 of 13 complication. With that being said she has been using silver cell for a while and while this was showing signs of improvement it is now gotten to the point where this has slowed down quite significantly. I do believe that switching to a different dressing may be beneficial for her today. 05/28/2021 upon evaluation today patient actually appears to be doing quite well in regard to her wound. In fact there is really not any need for sharp debridement today based on what I see. The Hydrofera Blue is doing great and overall I think that she is managing quite nicely with the compression wrapping. Her edema is still down quite a bit with the wrapping in place. 06/11/2021 upon evaluation today patient appears to be doing well with regard to her leg ulcer. She has been tolerating the dressing changes without complication. Fortunately there does not appear to be any signs of active infection at this time which is great news. No fevers, chills, nausea, vomiting, or diarrhea. She is going require some sharp debridement today. 10/11; left leg ulcer much better looking and with improved surface area. She is using Hydrofera Blue under 3 layer compression. She does not have any stocking on the right leg which in itself has very significant nonpitting edema 06/25/2021 upon evaluation today patient appears to be doing well with regard to the wound on her left leg. She has been  tolerating the dressing changes without complication and overall I am extremely pleased with where things stand today. I do not see any evidence of infection which is great news. 07/02/2021 upon evaluation today patient's wound actually showing signs of excellent improvement and actually very pleased with where we stand today and the overall appearance. Fortunately there does not appear to be any signs of active infection. No fevers, chills, nausea, vomiting, or diarrhea. 07/09/2021 upon evaluation today patient appears to be doing better in regard to her wound this is measuring smaller and she is headed in the appropriate direction based on what I am seeing currently. I do not see any evidence of active infection systemically which is great news. 07/16/2021 upon evaluation today patient appears to be doing pretty well currently in regard to her leg ulcer. This is measuring smaller and looking much better  this is great news. Fortunately there does not appear to be any evidence of active infection systemically which is great news as well. 07/23/2021 upon evaluation today patient's wound is actually showing signs of good improvement this is definitely measuring smaller. I do not see any signs of infection which is great news and overall very pleased in that regard. Overall I think that she is doing well with the [facility]. 07/30/2020 upon evaluation today patient appears to be doing well in regard to her wound. She has been tolerating the dressing changes without complication think the Hydrofera Blue at this point is actually causing her to dry out a lot as far as the wound bed is concerned. Subsequently I think that we need to try to see what we can do to improve this. Fortunately there does not appear to be any evidence of active infection at this time which is great news. 08/06/2021 upon evaluation today patient appears to be doing well with regards to her wound. Is not measuring significantly  smaller initial inspection but upon closer inspection she has a lot of new skin growth across the central portion of the wound. I think will get very close to complete resolution. 08/13/2021 upon evaluation today patient appears to be doing well with regard to her wound. In fact this appears to be I believe healed although there is still some question as to whether it is completely so. I am concerned about the fact that to be honest she still has some evidence of dry skin that could be just trapping something underneath I still want to debride anything as I am afraid of causing some damage to the good skin for that reason I Georgina Peer probably monitor for 1 more week before completely closing everything out. 08/20/2021 upon evaluation today patient actually appears to be doing excellent. In fact she appears to be completely healed. This was the case last week as well we just wanted to make sure nothing reopened since she does not really have an ability to put on compression socks this is good to be something we need to make sure was well-healed. Nonetheless I think we have achieved that goal as of today. 12/27; this is a patient we discharged 2 weeks ago with wounds on her left lower leg laterally. She was supposed to get stockings and really did not get them. She developed blistering and reopening probably sometime late last week. She has 3 areas in the same area as previously. The mid calf dimensions on the left went from 39 to 51 cm today. She also has very significant edema on the right leg but as far as I am aware has not had wounds in this area 1/4; patient presents for follow-up. She has tolerated the compression wrap well. She has no issues or complaints today. 09/17/2021 upon evaluation today patient appears to be doing well with regard to her wounds. She has been tolerating the dressing changes without complication. Fortunately there does not appear to be any signs of active infection at this time.  No fevers, chills, nausea, vomiting, or diarrhea. 10/01/2021 upon evaluation today patient appears to be doing well with regard to her wound this is actually measuring better and looking smaller and I am very pleased in that regard. Fortunately I do not see any evidence of active infection locally nor systemically at this point. No fevers, chills, nausea, vomiting, or diarrhea. 10/08/2021 upon evaluation patient's wounds are actually showing some signs of improvement measuring a little bit smaller  and looking a little bit better. Overall I do not think there is any need for sharp debridement today which is good news. No fevers, chills, nausea, vomiting, or diarrhea. 10/14/2021 upon evaluation today patient appears to be doing well with regard to her wound. She has been tolerating the dressing changes without complication and overall I am extremely pleased with where things stand today. There is a little bit of film on the surface of the wounds but this was carefully cleaned away with just saline and gauze she really did not want me to do any sharp debridement today. 10/29/2021 upon evaluation today patient appears to be doing quite well in regard to her wounds. She is actually showing signs of excellent improvement which is great news and overall I feel like we are on the right track at this time. She does have a wound on both legs and she does need something compression wise when she heals for that reason we will get a go and see about ordering her bilateral juxta lite compression wraps. 11/05/2021 upon evaluation today patient appears to be doing well with regard to her wound. She has been tolerating the dressing changes without complication. Fortunately I do not see any evidence of active infection locally nor systemically at this time which is great news. No fevers, chills, nausea, vomiting, or diarrhea. 11/12/2021 upon evaluation today patient appears to be doing well with regard to her wounds. She is  doing excellent on the left on the right this is not doing quite as well there is some slough and biofilm buildup. 3/14; patient presents for follow-up. She has no issues or complaints today. She has tolerated the compression wraps well. 11/26/2021 upon evaluation today patient appears to be doing well with regard to her wounds. Both are showing signs of good improvement which is great news. I do not see any evidence of active infection and overall think she is healing quite nicely. 12/03/2021 upon evaluation today patient appears to be doing well with regard to her wounds. Both are showing signs of excellent improvement I am very pleased with where things stand and I think that she is making good progress here. Fortunately I do not see any signs of active infection locally or systemically which is great news. No fevers, chills, nausea, vomiting, or diarrhea. 12-10-2021 upon evaluation today patient's wounds actually appear to be doing awesome. I am extremely pleased with where we stand I think she is making excellent progress. I do not see any signs of active infection locally or systemically at this time. 12-17-2021 upon evaluation today patient's wounds are actually showing signs of good improvement. Fortunately I do not see any evidence of active infection locally nor systemically which is great news. Overall I do not believe that she is showing any signs of significant worsening which is good news. With that being said I do believe that she has a little bit of film on the surface of the wound I was actually able to clear this away with saline and gauze no sharp debridement was necessary. SAKOYA, WIN ([id number]) [id number] Page 4 of 13 12-24-2021 upon evaluation today patient appears to be doing well with regard to her wounds. I do not see any signs of active infection currently which is great news and overall I think that we are on the right track here. No fevers,  chills, nausea, vomiting, or diarrhea. 12-31-2021 upon evaluation today patient appears to be doing well with regard to her wounds. Both are showing signs  of improvement the wound on the right leg is going require some sharp debridement on the left seems to be doing quite well. 01-07-2022 upon evaluation today patient appears to be doing well currently in regard to her wounds. She is actually making good progress bilaterally. Both of them require little bit of sharp debridement but not too much which is great news. Upon inspection patient's wound bed actually showed signs of good granulation and epithelization on both legs. She still is having significant issues with the right leg compared to the left although I do feel like both are actually showing signs of pretty good improvement. 01-21-2022 upon evaluation today patient's wound is actually showing signs of excellent improvement I am very pleased with where we stand currently. I do not see any signs of active infection locally or systemically which is great news. No fevers, chills, nausea, vomiting, or diarrhea. 01-28-2022 upon evaluation today patient appears to be doing well with regard to her wound on the left leg this is pretty much just about healed which is great news. Unfortunately in regard to the wound on her right leg this is not doing nearly as well in fact I think we probably need to see about a culture today that was discussed with her. 02-04-2022 upon evaluation today patient appears to be doing well with regard to her left leg which in fact might actually be completely healed I am not 100% sure. Nonetheless this is shown signs of excellent improvement which is great news. With that being said in regards to the right leg there is some slough and film buildup on the surface of the wound although she is feeling much better than last week this does appear to be doing much better than it was last week. Fortunately there does not appear to be  any signs of active infection locally or systemically at this time. 02-11-2022 upon evaluation today patient's wounds appear to be doing decently well. In fact the left leg is healed although there is brand-new skin were definitely given still need to wrap her for the time being. On the right leg this is showing signs of some epithelial growth in the middle of the wound it still measures the same because there is still a larger area with speckled openings but again as far as the entire area being open this is significantly improved which is great news. I am very pleased. 6/13; left leg remains healed and she has a juxta light to apply to it today. There is some concern about whether she is going to be able to do this at home. She lives with a daughter although she has her own disability. On the right the wound looks smaller to me nice epithelialization good edema control. 02-25-2022 upon evaluation today patient appears to be doing well with regard to her legs the left leg is still healed the right leg is doing much better. I think we are on the right track here. With that being said unfortunately she continues to have significant issues with some other problems and she actually tells me on Saturday she was having lunch and went inside to sit down when she tells me she had all of a sudden a sharp sensation that went from her hand through her arm into her leg and her neck and she tells me that she could not see anything for the next hour it was completely black. Slowly after this her vision started to return and has been normal since. It sounds  to me as if she may have had a TIA or mini stroke at this point. Nonetheless I think that she needs to have this checked out ASAP. Her primary care provider is Dr. Clide Deutscher at [facility]. We will get a try to get in touch with him. 03-10-2022 upon evaluation today patient's wound actually showed signs of good improvement. She seems to be making excellent progress here.  I do not see any evidence of active infection locally or systemically which is great news. No fevers, chills, nausea, vomiting, or diarrhea. She was in the hospital since have seen her last she ended up finding out that she was having syncopal episodes. That is what was going on with the blacking out and losing vision. Subsequently they adjusted some of her medication she seems to be doing better. 03-18-2022 upon evaluation today patient's wound is actually showing signs of good granulation and epithelization at this point. Fortunately I do not see any signs of active infection locally or systemically which is great news. 03-25-2022 any evidence of active infection locally or systemically which is great news. Any evidence of active infection which is great news and overall the wound is looking better. Upon evaluation today patient appears to be doing well with regard to her leg ulcer. She has been tolerating the dressing changes without complication. Fortunately I do not see 7/25; the patient's wound is on the right lateral lower extremity using silver alginate 3 layer compression. She has chronic venous insufficiency and lymphedema. We are using a juxta lite on the left leg which does not have any open wounds but she is leaving this on all week it seems that there just is not a way to get this changed at home 04-08-2022 upon evaluation today patient appears to be doing well currently in regard to her wound. She has been tolerating the dressing changes without complication. Fortunately I do not see any evidence of active infection locally or systemically which is great news. No fever or chills noted her left leg has reopened at this point. 04-15-2022 upon evaluation patient's wounds actually are showing signs of improvement which is great news. Little by little I do believe her making progress here. In regard to the right leg this is looking better though the measurement does not speak to it there is a lot  of new skin growth. On the left leg this is significantly smaller compared to last week most of the blistered area has completely closed. 04-22-2022 upon evaluation today patient appears to be doing excellent in regard to her wounds both are showing signs of improvement. Her knees and above are still extremely swollen the legs were rewrapped them are down significantly. With that being said I do not think that we will get a be able to keep them that way once were not rapid noted that she really has no way to be able to put juxta lites on and off and nobody to help her with that. That is the biggest concern that we have here at this point. Fortunately I do not think that there is any evidence of infection which is great news. 04-29-2022 upon evaluation today patient appears to be doing well with regard to her wounds. She has been tolerating the dressing changes without complication. Fortunately there does not appear to be any evidence of infection locally or systemically which is great news and overall I am extremely pleased in that regard. Left leg is healed the right leg is very close we discussed  today the possibility of her granddaughter helping with putting on and off compression wraps for her. 05-06-2022 upon evaluation today patient appears to be doing well currently in regard to her left leg which is showing signs of being completely healed. With regard to her right leg she still has an open area though this is doing much better there is a lot less drainage than what we previously noted. Fortunately I see no evidence of active infection locally or systemically at this time which is great news. 05-20-2022 upon evaluation today patient appears to be doing excellent in regard to her leg. The left leg is still healed the right leg is much better. Fortunately I see no signs of infection locally or systemically at this time which is great news. No fevers, chills, nausea, vomiting, or diarrhea. 05-27-2022  upon evaluation today patient appears to be doing excellent in regard to her wound this is actually measuring smaller and looking much better. Fortunately I see no signs of active infection locally or systemically at this time. 06-03-2022 upon evaluation patient's wound actually is measuring smaller and looking much better. She still has a lot of discomfort but fortunately nothing to significant at this point which is great news. No fevers, chills, nausea, vomiting, or diarrhea. I think we are making some pretty good progress here. 06-10-2022 upon evaluation today patient appears to be doing well currently in regard to her wound which is actually measuring somewhat smaller. This is actually 2 separate wounds that are clustered and this is making it appear little bit larger than what it is but nonetheless we are seeing some definite improvement. Fortunately I do not see any signs of active infection at this time. No fevers, chills, nausea, vomiting, or diarrhea. 06-17-2022 upon evaluation today patient appears to be doing well currently in regard to her wound although she was in the hospital since she was last here. ANTONIQUE, LANGFORD ([id number]) [id number] Page 5 of 13 That was from 06-12-2022 through 06-16-2022 which was yesterday. This was due to a CHF exacerbation. With that being said the patient has noted in her record review acute on chronic congestive heart failure with hypertensive emergency noted. She also has stage IIIa chronic kidney disease noted. With that being said I do think her wound is much better because I got a lot of fluid off to try to diurese her to a degree but her kidneys would not hold for that they put her on furosemide 40 mg and that is helping to get the fluid off she is much better but still has a lot of fluid buildup. 06-24-2022 upon evaluation today patient appears to be doing well currently in regard to her wound is not significantly smaller but  it is improved overall. Fortunately I do not see any signs of active infection locally nor systemically at this time. 07-01-2022 upon evaluation today patient appears to be doing okay in regard to her wound I do not believe the silver alginate is helping quite as much as I would like to see. I would actually recommend we switch back over to the [facility] at this point which I think may do better. Fortunately I do not see any signs of infection which is good news were going to switch to Tubigrip on the left leg on the right leg we will get a continue with the compression wrap. 07-08-2022 upon evaluation today patient unfortunately is not doing nearly as well in regard to her left leg. We had to put her  back into a 4-layer compression wrap today due to a significant blister that appeared. I do believe that we need to try to get this to reabsorb we will see how things do over the next week. With that being said the right leg is doing well the 4-layer compression wrap and switch to the [facility] seems to be very beneficial I am pleased with what we are seeing in that regard. 11/7; patient's wounds are on the right posterior lateral leg actually measures smaller and look healthy we have been using Hydrofera Blue under 3 layer compression. Last week she had a large blister that was left intact covered with alginate but the leg was wrapped in 3 layer. Today the left leg had unroofed but the skin still looks reasonably healthy. Nevertheless she is still going to require the left leg to be wrapped 07-22-2022 upon evaluation today patient unfortunately is extremely swollen in regard to her bilateral lower extremities despite the fact that she is currently being compression wrap a 4-layer compression wrap bilaterally. This is unfortunate and the fact that I believe this is not just a lower extremity swelling issue but to be honest this is a much more global issue with edema in regard to her congestive  heart failure. Her BNP has been up in the past month and I am not sure where it is as it has been rechecked she sees the heart failure clinic on Thursday in 2 days. With that being said she does tell me that she is having trouble laying down she tells me that she is having trouble walking as well. With all that being said I advised that she probably needs to go to the hospital for further evaluation. 07-29-2022 upon evaluation today patient appears to be doing well currently in regard to her wound on the right leg I am very pleased the left leg is also doing better though it is weeping quite a bit. I think we need to do something to Help calm this down a bit. She is in agreement with that plan. 08-05-2022 upon evaluation today patient's wounds are showing signs of improvement albeit slowly. Fortunately I do not see any evidence of infection locally nor systemically at this time which is great news. No fevers, chills, nausea, vomiting, or diarrhea. 08-12-2022 upon evaluation today patient actually appears to be doing well currently in regard to her wounds which is showing signs of improvement. Fortunately I do not see any signs of active infection locally nor systemically at this time which is great news. No fevers, chills, nausea, vomiting, or diarrhea. Electronic Signature(s) Signed: 08/12/2022 3:40:00 PM By: Worthy Keeler PA-C Entered By: Worthy Keeler on 08/12/2022 15:39:59 -------------------------------------------------------------------------------- Physical Exam Details Patient Name: Date of Service: [name] 08/12/2022 2:45 PM Medical Record Number: [id number] Patient Account Number: [id number] Date of Birth/Sex: Treating RN: 18-Sep-1945 (76 y.o. [name] Primary Care Provider: [name] Other Clinician: Referring Provider: Treating Provider/Extender: Mayer Masker, Ngwe Weeks in Treatment: 89 Constitutional Well-nourished and well-hydrated in no acute  distress. Respiratory normal breathing without difficulty. Psychiatric this patient is able to make decisions and demonstrates good insight into disease process. Alert and Oriented x 3. pleasant and cooperative. Notes Upon inspection patient's wound bed actually showed signs of good granulation epithelization at this point. Fortunately I do not see any signs of active infection systemically or locally which is great news as well and in general I do believe that we are headed in  the right direction. Electronic Signature(s) Signed: 08/12/2022 3:40:22 PM By: Worthy Keeler PA-C Entered By: Worthy Keeler on 08/12/2022 15:40:22 [name] ([id number]) [id number] Page 6 of 13 -------------------------------------------------------------------------------- Physician Orders Details Patient Name: Date of Service: [name] 08/12/2022 2:45 PM Medical Record Number: [id number] Patient Account Number: [id number] Date of Birth/Sex: Treating RN: 31-Jul-1946 (76 y.o. [name] Primary Care Provider: [name] Other Clinician: Referring Provider: Treating Provider/Extender: Mayer Masker, Ngwe Weeks in Treatment: 40 Verbal / Phone Orders: No Diagnosis Coding ICD-10 Coding Code Description I89.0 Lymphedema, not elsewhere classified I87.332 Chronic venous hypertension (idiopathic) with ulcer and inflammation of left lower extremity L97.812 Non-pressure chronic ulcer of other part of right lower leg with fat layer exposed Follow-up Appointments Return Appointment in 1 week. Nurse Visit as needed Bathing/ Shower/ Hygiene May shower with wound dressing protected with water repellent cover or cast protector. No tub bath. Anesthetic (Use 'Patient Medications' Section for Anesthetic Order Entry) Lidocaine applied to wound bed Edema Control - Lymphedema / Segmental Compressive Device / Other Bilateral Lower Extremities Optional: One layer of  unna paste to top of compression wrap (to act as an anchor). Elevate, Exercise Daily and A void Standing for Long Periods of Time. Elevate legs to the level of the heart and pump ankles as often as possible Elevate leg(s) parallel to the floor when sitting. Compression Pump: Use compression pump on left lower extremity for 60 minutes, twice daily. - You may pump over wraps Compression Pump: Use compression pump on right lower extremity for 60 minutes, twice daily. - You may pump over wraps Wound Treatment Wound #4 - Lower Leg Wound Laterality: Right, Lateral Cleanser: Soap and Water 1 x Per Week/30 Days Discharge Instructions: Gently cleanse wound with antibacterial soap, rinse and pat dry prior to dressing wounds Cleanser: Wound Cleanser 1 x Per Week/30 Days Discharge Instructions: Wash your hands with soap and water. Remove old dressing, discard into plastic bag and place into trash. Cleanse the wound with Wound Cleanser prior to applying a clean dressing using gauze sponges, not tissues or cotton balls. Do not scrub or use excessive force. Pat dry using gauze sponges, not tissue or cotton balls. Prim Dressing: Silvercel Small 2x2 (in/in) 1 x Per Week/30 Days ary Discharge Instructions: Apply Silvercel Small 2x2 (in/in) as instructed Compression Wrap: 3-LAYER WRAP - Profore Lite LF 3 Multilayer Compression Bandaging System 1 x Per Week/30 Days Discharge Instructions: Apply 3 multi-layer wrap as prescribed. Wound #6 - Lower Leg Wound Laterality: Left, Medial Cleanser: Soap and Water 1 x Per Week/30 Days Discharge Instructions: Gently cleanse wound with antibacterial soap, rinse and pat dry prior to dressing wounds Cleanser: Wound Cleanser 1 x Per Week/30 Days Discharge Instructions: Wash your hands with soap and water. Remove old dressing, discard into plastic bag and place into trash. Cleanse the wound with Wound Cleanser prior to applying a clean dressing using gauze sponges, not tissues  or cotton balls. Do not scrub or use excessive force. Pat dry using gauze sponges, not tissue or cotton balls. GUY, TONEY ([id number]) [id number] Page 7 of 13 Prim Dressing: Silvercel 4 1/4x 4 1/4 (in/in) 1 x Per Week/30 Days ary Discharge Instructions: Apply Silvercel 4 1/4x 4 1/4 (in/in) as instructed Secondary Dressing: ABD Pad 5x9 (in/in) 1 x Per Week/30 Days Discharge Instructions: Cover with ABD pad Compression Wrap: 3-LAYER WRAP - Profore Lite LF 3 Multilayer Compression Bandaging System 1 x Per Week/30 Days Discharge Instructions: Apply 3 multi-layer wrap  as prescribed. Electronic Signature(s) Signed: 08/12/2022 4:08:04 PM By: Carlene Coria RN Signed: 08/12/2022 4:28:12 PM By: Worthy Keeler PA-C Entered By: Carlene Coria on 08/12/2022 16:08:04 -------------------------------------------------------------------------------- Problem List Details Patient Name: Date of Service: [name] 08/12/2022 2:45 PM Medical Record Number: [id number] Patient Account Number: [id number] Date of Birth/Sex: Treating RN: Jun 21, 1946 (76 y.o. [name] Primary Care Provider: [name] Other Clinician: Referring Provider: Treating Provider/Extender: Mayer Masker, Ngwe Weeks in Treatment: 83 Active Problems ICD-10 Encounter Code Description Active Date MDM Diagnosis I89.0 Lymphedema, not elsewhere classified 01/16/2021 No Yes I87.332 Chronic venous hypertension (idiopathic) with ulcer and inflammation of left 01/16/2021 No Yes lower extremity L97.812 Non-pressure chronic ulcer of other part of right lower leg with fat layer 01/07/2022 No Yes exposed Inactive Problems ICD-10 Code Description Active Date Inactive Date L97.822 Non-pressure chronic ulcer of other part of left lower leg with fat layer exposed 01/16/2021 01/16/2021 Resolved Problems Electronic Signature(s) Signed: 08/12/2022 3:10:04 PM By: Worthy Keeler PA-C Entered By: Worthy Keeler on 08/12/2022 15:10:04 [name] ([id number]) [id number] Page 8 of 13 -------------------------------------------------------------------------------- Progress Note Details Patient Name: Date of Service: [name] 08/12/2022 2:45 PM Medical Record Number: [id number] Patient Account Number: [id number] Date of Birth/Sex: Treating RN: 04/16/1946 (76 y.o. [name] Primary Care Provider: [name] Other Clinician: Referring Provider: Treating Provider/Extender: Mayer Masker, Ngwe Weeks in Treatment: 31 Subjective Chief Complaint Information obtained from Patient Bilateral LE Ulcers History of Present Illness (HPI) 03/15/2020 upon evaluation today patient presents today for initial evaluation here in our clinic concerning a wound that is on the left posterior lower extremity. Unfortunately this has been giving the patient some discomfort at this point which she notes has been affected her pretty much daily. The patient does have a history of venous insufficiency, hypertension, congestive heart failure, and a history of breast cancer. Fortunately there does not appear to be evidence of active infection at this time which is great news. No fevers, chills, nausea, vomiting, or diarrhea. Wound is not extremely large but does have some slough covering the surface of the wound. This is going require sharp debridement today. 03/15/2020 upon evaluation today patient appears to be doing fairly well in regard to her wound. She does have some slough noted on the surface of the wound currently but I do believe the Iodoflex has been beneficial over the past week. There is no signs of active infection at this time which is great news and overall very pleased with the progress. No fevers, chills, nausea, vomiting, or diarrhea. 04/02/2020 upon evaluation today patient appears to be doing a little better in regard to her wound size wise this is not  tremendously smaller she does have some slough buildup on the surface of the wound. With that being said this is going require some sharp debridement today to clear away some of the necrotic debris. Fortunately there is no evidence of active infection at this time. No fevers, chills, nausea, vomiting, or diarrhea. 04/10/2020 on evaluation today patient appears to be doing well with regard to her ulcer which is measuring somewhat smaller today. She actually has more granulation tissue noted which is also good news is no need for sharp debridement today. Fortunately there is no evidence of infection either which is also excellent. No fevers, chills, nausea, vomiting, or diarrhea. 04/26/2020 on evaluation today patient's wound actually is showing signs of good improvement which is great news. There does not appear  to be any evidence of active infection. I do believe she is tolerating the collagen at this point. 05/03/2020 upon evaluation today patient's wound actually showed signs of good granulation at this time there does not appear to be any evidence of active infection which is great news and overall very pleased with where things stand. With that being said I do believe that the patient is can require some sharp debridement today but fortunately nothing too significant. 05/11/20 on evaluation today patient appears to be doing well in regard to her leg ulcer. She's been tolerating the dressing changes without complication. Fortunately there is no signs of active infection at this time. Overall I'm very pleased with where things stand. 05/18/2020 upon evaluation today patient's wound is showing signs of improvement is very dry and I think the collagen for the most part just dried to the wound bed. I am going to clean this away with sharp debridement in order to get this under control in my opinion. 05/25/2020 upon evaluation today patient actually appears to be doing well in regard to her leg ulcer. In fact  upon inspection it appears she could potentially be completely healed but that is not guaranteed based on what we are seeing. There does not appear to be any signs of active infection which is great news. 05/31/2020 on evaluation today patient appears to be doing well with regard to her wounds. She in fact appears to be almost completely healed on the left leg there is just a very small opening remaining point 1 06/08/2020 upon evaluation today patient appears to be doing excellent in regard to her leg ulcer on the left in fact it appears to be that she is completely healed as of today. I see no signs of active infection at this time which is great news. No fevers, chills, nausea, vomiting, or diarrhea. READMISSION 01/16/2021 Patient that we have had in this clinic with a wound on the left posterior calf in the summer 2021 extending into October. She was felt to have chronic venous insufficiency. Per the patient's description she was discharged with what sounds like external compression stockings although she could not afford the "$75 per leg". She therefore did not get anything. Apparently her wound that she has currently is been in existence since January. She has been followed at vein and vascular with Unna boots and I think calcium alginate. She had ABIs done on 06/25/2020 that showed an noncompressible ABI on the right leg and the left leg but triphasic waveforms with good great toe pressures and a TBI of 0.90 on the right and 0.91 on the left Surprisingly the patient also had venous reflux studies that were really unremarkable. This included no evidence of DVT or SVT but there was no evidence of s s deep venous insufficiency or superficial venous insufficiency in the greater saphenous or short saphenous veins bilaterally. I would wonder about more central venous issues or perhaps this is all lymphedema 01/25/2021 upon evaluation today patient appears to be doing decently well and guard to her  wound. The good news is the Iodoflex that is job she saw Dr. Dellia Nims last week for readmission and to be honest I think that this has done extremely well over that week. I do believe that she can tolerate the sharp debridement today which will be good. I think that cleaning off the wound will likely allow [place] to be able to use a different type of dressing I do not think the Iodoflex will even  be necessary based on how clean the wound looks. Fortunately there is no sign of active infection at this time which is great news. No VIRGIL, SLINGER ([id number]) [id number] Page 9 of 13 fevers, chills, nausea, vomiting, or diarrhea. 02/12/2021 upon evaluation today patient appears to be doing well with regard to her leg ulcer. We did switch to alginate when she came for nurse visit I think is doing much better for her. Fortunately there does not appear to be any signs of active infection at this time which is great news. No fevers, chills, nausea, vomiting, or diarrhea. 02/18/2021 upon evaluation today patient's wound is actually showing signs of good improvement. I am very pleased with where things stand currently. I do not see any signs of active infection which is great news and overall I feel like she is making good progress. The patient likewise is happy that things are doing so well and that this is measuring somewhat smaller. 02/25/2021 upon evaluation today patient actually appears to be doing quite well in regard to her wounds. She has been tolerating the dressing changes without complication. Fortunately there does not appear to be any signs of active infection which is great news and overall I am extremely pleased with where things stand. No fevers, chills, nausea, vomiting, or diarrhea. She does have a lot of edema I really do think she needs compression socks to be worn in order to prevent things from causing additional issues for her currently. Especially on the right leg  she should be wearing compression socks all the time to be honest. 03/04/2021 upon evaluation today patient's leg ulcer actually appears to be doing pretty well which is great news. There does not appear to be any significant change but overall the appearance is better even though the size is not necessarily reflecting a great improvement. Fortunately there does not appear to be any signs of active infection. No fevers, chills, nausea, vomiting, or diarrhea. 03/12/2021 upon evaluation today patient appears to be doing well with regard to her wounds. She has been tolerating the dressing changes without complication. The good news is that the wound on her left lateral leg is doing much better and is showing signs of good improvement. Overall her swelling is controlled well as well. She does need some compression socks she plans to go Jefferey socks next week. 03/18/2021 on evaluation today patient's wound actually is showing signs of good improvement. She does have a little bit of slough this can require some sharp debridement today. 03/25/2021 upon evaluation today patient appears to be doing well with regard to her wound on the left lateral leg. She has been tolerating the dressing changes without complication. Fortunately there does not appear to be any signs of active infection at this time. No fever chills no 04/01/2021 upon evaluation today patient's wound is showing signs of improvement and overall very pleased with where things stand at this point. There is no evidence of active infection which is great news as well and in general I think that she is making great progress. 04/09/2021 upon evaluation today patient appears to be doing well with regard to her ankle ulcer. She is making good progress currently which is great news. There does not appear to be any signs of infection also excellent news. In general I am extremely pleased with where things stand today. No fevers, chills, nausea, vomiting, or  diarrhea. 04/23/2021 upon inspection today patient appears to be doing quite well with regard to her leg ulcer. She  has been tolerating the dressing changes without complication. She is can require some sharp debridement today but overall seems to be doing excellent. 05/06/2021 upon evaluation today patient unfortunately appears to be doing poorly overall in regard to her swelling. She is actually significantly more swollen than last week despite the fact that her wound is doing better this is not a good trend. I think that she probably needs to see her cardiologist ASAP and I discussed that with her today. This includes the fact that honestly I think she probably is getting need an increase in her diuretic or something to try to clear away some of the excess fluid that she is experiencing at the moment. She is not been doing her pumps even twice a day but again right now more concerned that if she were to do the pump she would fluid overload her lungs causing other issues as far as her breathing is concerned. Obviously I think she needs to contact them and move up the appointment for the 12th of something sooner 2 weeks is a bit too long to wait with how swollen she has. 05/14/2021 upon evaluation today patient's wound actually showing signs of being a little bit smaller compared to previous. She has been making good progress however. Fortunately there does not appear to be any evidence of active infection which is great. Overall I am extremely pleased in that regard. Nonetheless I am still concerned that she is having quite a bit of issue as far as the swelling is concerned she has been placed on fluid restriction as well as an increase in the furosemide by her doctor. We will see how things progress. 05/21/2021 upon evaluation today patient appears to be doing well with regard to her wound in general. She has been tolerating the dressing changes without complication. With that being said she has been  using silver cell for a while and while this was showing signs of improvement it is now gotten to the point where this has slowed down quite significantly. I do believe that switching to a different dressing may be beneficial for her today. 05/28/2021 upon evaluation today patient actually appears to be doing quite well in regard to her wound. In fact there is really not any need for sharp debridement today based on what I see. The Hydrofera Blue is doing great and overall I think that she is managing quite nicely with the compression wrapping. Her edema is still down quite a bit with the wrapping in place. 06/11/2021 upon evaluation today patient appears to be doing well with regard to her leg ulcer. She has been tolerating the dressing changes without complication. Fortunately there does not appear to be any signs of active infection at this time which is great news. No fevers, chills, nausea, vomiting, or diarrhea. She is going require some sharp debridement today. 10/11; left leg ulcer much better looking and with improved surface area. She is using Hydrofera Blue under 3 layer compression. She does not have any stocking on the right leg which in itself has very significant nonpitting edema 06/25/2021 upon evaluation today patient appears to be doing well with regard to the wound on her left leg. She has been tolerating the dressing changes without complication and overall I am extremely pleased with where things stand today. I do not see any evidence of infection which is great news. 07/02/2021 upon evaluation today patient's wound actually showing signs of excellent improvement and actually very pleased with where we stand today  and the overall appearance. Fortunately there does not appear to be any signs of active infection. No fevers, chills, nausea, vomiting, or diarrhea. 07/09/2021 upon evaluation today patient appears to be doing better in regard to her wound this is measuring smaller and she  is headed in the appropriate direction based on what I am seeing currently. I do not see any evidence of active infection systemically which is great news. 07/16/2021 upon evaluation today patient appears to be doing pretty well currently in regard to her leg ulcer. This is measuring smaller and looking much better this is great news. Fortunately there does not appear to be any evidence of active infection systemically which is great news as well. 07/23/2021 upon evaluation today patient's wound is actually showing signs of good improvement this is definitely measuring smaller. I do not see any signs of infection which is great news and overall very pleased in that regard. Overall I think that she is doing well with the [facility]. 07/30/2020 upon evaluation today patient appears to be doing well in regard to her wound. She has been tolerating the dressing changes without complication think the Hydrofera Blue at this point is actually causing her to dry out a lot as far as the wound bed is concerned. Subsequently I think that we need to try to see what we can do to improve this. Fortunately there does not appear to be any evidence of active infection at this time which is great news. 08/06/2021 upon evaluation today patient appears to be doing well with regards to her wound. Is not measuring significantly smaller initial inspection but upon closer inspection she has a lot of new skin growth across the central portion of the wound. I think will get very close to complete resolution. 08/13/2021 upon evaluation today patient appears to be doing well with regard to her wound. In fact this appears to be I believe healed although there is still some question as to whether it is completely so. I am concerned about the fact that to be honest she still has some evidence of dry skin that could be just trapping something underneath I still want to debride anything as I am afraid of causing some damage to the  good skin for that reason I Georgina Peer probably monitor for 1 more week before completely closing everything out. TASHEENA, WAMBOLT ([id number]) [id number] Page 10 of 13 08/20/2021 upon evaluation today patient actually appears to be doing excellent. In fact she appears to be completely healed. This was the case last week as well we just wanted to make sure nothing reopened since she does not really have an ability to put on compression socks this is good to be something we need to make sure was well-healed. Nonetheless I think we have achieved that goal as of today. 12/27; this is a patient we discharged 2 weeks ago with wounds on her left lower leg laterally. She was supposed to get stockings and really did not get them. She developed blistering and reopening probably sometime late last week. She has 3 areas in the same area as previously. The mid calf dimensions on the left went from 39 to 51 cm today. She also has very significant edema on the right leg but as far as I am aware has not had wounds in this area 1/4; patient presents for follow-up. She has tolerated the compression wrap well. She has no issues or complaints today. 09/17/2021 upon evaluation today patient appears to be doing well with regard  to her wounds. She has been tolerating the dressing changes without complication. Fortunately there does not appear to be any signs of active infection at this time. No fevers, chills, nausea, vomiting, or diarrhea. 10/01/2021 upon evaluation today patient appears to be doing well with regard to her wound this is actually measuring better and looking smaller and I am very pleased in that regard. Fortunately I do not see any evidence of active infection locally nor systemically at this point. No fevers, chills, nausea, vomiting, or diarrhea. 10/08/2021 upon evaluation patient's wounds are actually showing some signs of improvement measuring a little bit smaller and looking a  little bit better. Overall I do not think there is any need for sharp debridement today which is good news. No fevers, chills, nausea, vomiting, or diarrhea. 10/14/2021 upon evaluation today patient appears to be doing well with regard to her wound. She has been tolerating the dressing changes without complication and overall I am extremely pleased with where things stand today. There is a little bit of film on the surface of the wounds but this was carefully cleaned away with just saline and gauze she really did not want me to do any sharp debridement today. 10/29/2021 upon evaluation today patient appears to be doing quite well in regard to her wounds. She is actually showing signs of excellent improvement which is great news and overall I feel like we are on the right track at this time. She does have a wound on both legs and she does need something compression wise when she heals for that reason we will get a go and see about ordering her bilateral juxta lite compression wraps. 11/05/2021 upon evaluation today patient appears to be doing well with regard to her wound. She has been tolerating the dressing changes without complication. Fortunately I do not see any evidence of active infection locally nor systemically at this time which is great news. No fevers, chills, nausea, vomiting, or diarrhea. 11/12/2021 upon evaluation today patient appears to be doing well with regard to her wounds. She is doing excellent on the left on the right this is not doing quite as well there is some slough and biofilm buildup. 3/14; patient presents for follow-up. She has no issues or complaints today. She has tolerated the compression wraps well. 11/26/2021 upon evaluation today patient appears to be doing well with regard to her wounds. Both are showing signs of good improvement which is great news. I do not see any evidence of active infection and overall think she is healing quite nicely. 12/03/2021 upon evaluation  today patient appears to be doing well with regard to her wounds. Both are showing signs of excellent improvement I am very pleased with where things stand and I think that she is making good progress here. Fortunately I do not see any signs of active infection locally or systemically which is great news. No fevers, chills, nausea, vomiting, or diarrhea. 12-10-2021 upon evaluation today patient's wounds actually appear to be doing awesome. I am extremely pleased with where we stand I think she is making excellent progress. I do not see any signs of active infection locally or systemically at this time. 12-17-2021 upon evaluation today patient's wounds are actually showing signs of good improvement. Fortunately I do not see any evidence of active infection locally nor systemically which is great news. Overall I do not believe that she is showing any signs of significant worsening which is good news. With that being said I do believe that  she has a little bit of film on the surface of the wound I was actually able to clear this away with saline and gauze no sharp debridement was necessary. 12-24-2021 upon evaluation today patient appears to be doing well with regard to her wounds. I do not see any signs of active infection currently which is great news and overall I think that we are on the right track here. No fevers, chills, nausea, vomiting, or diarrhea. 12-31-2021 upon evaluation today patient appears to be doing well with regard to her wounds. Both are showing signs of improvement the wound on the right leg is going require some sharp debridement on the left seems to be doing quite well. 01-07-2022 upon evaluation today patient appears to be doing well currently in regard to her wounds. She is actually making good progress bilaterally. Both of them require little bit of sharp debridement but not too much which is great news. Upon inspection patient's wound bed actually showed signs of good granulation and  epithelization on both legs. She still is having significant issues with the right leg compared to the left although I do feel like both are actually showing signs of pretty good improvement. 01-21-2022 upon evaluation today patient's wound is actually showing signs of excellent improvement I am very pleased with where we stand currently. I do not see any signs of active infection locally or systemically which is great news. No fevers, chills, nausea, vomiting, or diarrhea. 01-28-2022 upon evaluation today patient appears to be doing well with regard to her wound on the left leg this is pretty much just about healed which is great news. Unfortunately in regard to the wound on her right leg this is not doing nearly as well in fact I think we probably need to see about a culture today that was discussed with her. 02-04-2022 upon evaluation today patient appears to be doing well with regard to her left leg which in fact might actually be completely healed I am not 100% sure. Nonetheless this is shown signs of excellent improvement which is great news. With that being said in regards to the right leg there is some slough and film buildup on the surface of the wound although she is feeling much better than last week this does appear to be doing much better than it was last week. Fortunately there does not appear to be any signs of active infection locally or systemically at this time. 02-11-2022 upon evaluation today patient's wounds appear to be doing decently well. In fact the left leg is healed although there is brand-new skin were definitely given still need to wrap her for the time being. On the right leg this is showing signs of some epithelial growth in the middle of the wound it still measures the same because there is still a larger area with speckled openings but again as far as the entire area being open this is significantly improved which is great news. I am very pleased. 6/13; left leg remains  healed and she has a juxta light to apply to it today. There is some concern about whether she is going to be able to do this at home. She lives with a daughter although she has her own disability. On the right the wound looks smaller to me nice epithelialization good edema control. 02-25-2022 upon evaluation today patient appears to be doing well with regard to her legs the left leg is still healed the right leg is doing much better. I think we  are on the right track here. With that being said unfortunately she continues to have significant issues with some other problems and she actually tells me on Saturday she was having lunch and went inside to sit down when she tells me she had all of a sudden a sharp sensation that went from her hand through her arm into her leg and her neck and she tells me that she could not see anything for the next hour it was completely black. Slowly after this her vision started to return and has been normal since. It sounds to me as if she may have had a TIA or mini stroke at this point. Nonetheless I think that she needs to have this checked out ASAP. Her primary care provider is Dr. Clide Deutscher at [facility]. We will get a try to get in touch with him. SAKURA, DENIS ([id number]) [id number] Page 11 of 13 03-10-2022 upon evaluation today patient's wound actually showed signs of good improvement. She seems to be making excellent progress here. I do not see any evidence of active infection locally or systemically which is great news. No fevers, chills, nausea, vomiting, or diarrhea. She was in the hospital since have seen her last she ended up finding out that she was having syncopal episodes. That is what was going on with the blacking out and losing vision. Subsequently they adjusted some of her medication she seems to be doing better. 03-18-2022 upon evaluation today patient's wound is actually showing signs of good granulation and epithelization  at this point. Fortunately I do not see any signs of active infection locally or systemically which is great news. 03-25-2022 any evidence of active infection locally or systemically which is great news. Any evidence of active infection which is great news and overall the wound is looking better. Upon evaluation today patient appears to be doing well with regard to her leg ulcer. She has been tolerating the dressing changes without complication. Fortunately I do not see 7/25; the patient's wound is on the right lateral lower extremity using silver alginate 3 layer compression. She has chronic venous insufficiency and lymphedema. We are using a juxta lite on the left leg which does not have any open wounds but she is leaving this on all week it seems that there just is not a way to get this changed at home 04-08-2022 upon evaluation today patient appears to be doing well currently in regard to her wound. She has been tolerating the dressing changes without complication. Fortunately I do not see any evidence of active infection locally or systemically which is great news. No fever or chills noted her left leg has reopened at this point. 04-15-2022 upon evaluation patient's wounds actually are showing signs of improvement which is great news. Little by little I do believe her making progress here. In regard to the right leg this is looking better though the measurement does not speak to it there is a lot of new skin growth. On the left leg this is significantly smaller compared to last week most of the blistered area has completely closed. 04-22-2022 upon evaluation today patient appears to be doing excellent in regard to her wounds both are showing signs of improvement. Her knees and above are still extremely swollen the legs were rewrapped them are down significantly. With that being said I do not think that we will get a be able to keep them that way once were not rapid noted that she really has no way to  be  able to put juxta lites on and off and nobody to help her with that. That is the biggest concern that we have here at this point. Fortunately I do not think that there is any evidence of infection which is great news. 04-29-2022 upon evaluation today patient appears to be doing well with regard to her wounds. She has been tolerating the dressing changes without complication. Fortunately there does not appear to be any evidence of infection locally or systemically which is great news and overall I am extremely pleased in that regard. Left leg is healed the right leg is very close we discussed today the possibility of her granddaughter helping with putting on and off compression wraps for her. 05-06-2022 upon evaluation today patient appears to be doing well currently in regard to her left leg which is showing signs of being completely healed. With regard to her right leg she still has an open area though this is doing much better there is a lot less drainage than what we previously noted. Fortunately I see no evidence of active infection locally or systemically at this time which is great news. 05-20-2022 upon evaluation today patient appears to be doing excellent in regard to her leg. The left leg is still healed the right leg is much better. Fortunately I see no signs of infection locally or systemically at this time which is great news. No fevers, chills, nausea, vomiting, or diarrhea. 05-27-2022 upon evaluation today patient appears to be doing excellent in regard to her wound this is actually measuring smaller and looking much better. Fortunately I see no signs of active infection locally or systemically at this time. 06-03-2022 upon evaluation patient's wound actually is measuring smaller and looking much better. She still has a lot of discomfort but fortunately nothing to significant at this point which is great news. No fevers, chills, nausea, vomiting, or diarrhea. I think we are making some  pretty good progress here. 06-10-2022 upon evaluation today patient appears to be doing well currently in regard to her wound which is actually measuring somewhat smaller. This is actually 2 separate wounds that are clustered and this is making it appear little bit larger than what it is but nonetheless we are seeing some definite improvement. Fortunately I do not see any signs of active infection at this time. No fevers, chills, nausea, vomiting, or diarrhea. 06-17-2022 upon evaluation today patient appears to be doing well currently in regard to her wound although she was in the hospital since she was last here. That was from 06-12-2022 through 06-16-2022 which was yesterday. This was due to a CHF exacerbation. With that being said the patient has noted in her record review acute on chronic congestive heart failure with hypertensive emergency noted. She also has stage IIIa chronic kidney disease noted. With that being said I do think her wound is much better because I got a lot of fluid off to try to diurese her to a degree but her kidneys would not hold for that they put her on furosemide 40 mg and that is helping to get the fluid off she is much better but still has a lot of fluid buildup. 06-24-2022 upon evaluation today patient appears to be doing well currently in regard to her wound is not significantly smaller but it is improved overall. Fortunately I do not see any signs of active infection locally nor systemically at this time. 07-01-2022 upon evaluation today patient appears to be doing okay in regard to her wound I  do not believe the silver alginate is helping quite as much as I would like to see. I would actually recommend we switch back over to the [facility] at this point which I think may do better. Fortunately I do not see any signs of infection which is good news were going to switch to Tubigrip on the left leg on the right leg we will get a continue with the compression  wrap. 07-08-2022 upon evaluation today patient unfortunately is not doing nearly as well in regard to her left leg. We had to put her back into a 4-layer compression wrap today due to a significant blister that appeared. I do believe that we need to try to get this to reabsorb we will see how things do over the next week. With that being said the right leg is doing well the 4-layer compression wrap and switch to the [facility] seems to be very beneficial I am pleased with what we are seeing in that regard. 11/7; patient's wounds are on the right posterior lateral leg actually measures smaller and look healthy we have been using Hydrofera Blue under 3 layer compression. Last week she had a large blister that was left intact covered with alginate but the leg was wrapped in 3 layer. Today the left leg had unroofed but the skin still looks reasonably healthy. Nevertheless she is still going to require the left leg to be wrapped 07-22-2022 upon evaluation today patient unfortunately is extremely swollen in regard to her bilateral lower extremities despite the fact that she is currently being compression wrap a 4-layer compression wrap bilaterally. This is unfortunate and the fact that I believe this is not just a lower extremity swelling issue but to be honest this is a much more global issue with edema in regard to her congestive heart failure. Her BNP has been up in the past month and I am not sure where it is as it has been rechecked she sees the heart failure clinic on Thursday in 2 days. With that being said she does tell me that she is having trouble laying down she tells me that she is having trouble walking as well. With all that being said I advised that she probably needs to go to the hospital for further evaluation. 07-29-2022 upon evaluation today patient appears to be doing well currently in regard to her wound on the right leg I am very pleased the left leg is also doing better though it  is weeping quite a bit. I think we need to do something to Help calm this down a bit. She is in agreement with that plan. 08-05-2022 upon evaluation today patient's wounds are showing signs of improvement albeit slowly. Fortunately I do not see any evidence of infection locally nor systemically at this time which is great news. No fevers, chills, nausea, vomiting, or diarrhea. 08-12-2022 upon evaluation today patient actually appears to be doing well currently in regard to her wounds which is showing signs of improvement. Fortunately I do not see any signs of active infection locally nor systemically at this time which is great news. No fevers, chills, nausea, vomiting, or diarrhea. TATAYANA, BESHEARS ([id number]) [id number] Page 12 of 13 Objective Constitutional Well-nourished and well-hydrated in no acute distress. Vitals Time Taken: 3:22 PM, Height: 69 in, Weight: 244 lbs, BMI: 36, Temperature: 98.6 F, Pulse: 64 bpm, Respiratory Rate: 18 breaths/min, Blood Pressure: 156/81 mmHg. Respiratory normal breathing without difficulty. Psychiatric this patient is able to make decisions and demonstrates  good insight into disease process. Alert and Oriented x 3. pleasant and cooperative. General Notes: Upon inspection patient's wound bed actually showed signs of good granulation epithelization at this point. Fortunately I do not see any signs of active infection systemically or locally which is great news as well and in general I do believe that we are headed in the right direction. Integumentary (Hair, Skin) Wound #4 status is Open. Original cause of wound was Gradually Appeared. The date acquired was: 10/27/2021. The wound has been in treatment 41 weeks. The wound is located on the Right,Lateral Lower Leg. The wound measures 2.5cm length x 1.7cm width x 0.1cm depth; 3.338cm^2 area and 0.334cm^3 volume. There is Fat Layer (Subcutaneous Tissue) exposed. There is no tunneling or  undermining noted. There is a medium amount of serosanguineous drainage noted. There is medium (34-66%) pink granulation within the wound bed. There is a medium (34-66%) amount of necrotic tissue within the wound bed including Adherent Slough. Wound #6 status is Open. Original cause of wound was Blister. The date acquired was: 07/09/2022. The wound has been in treatment 3 weeks. The wound is located on the Left,Medial Lower Leg. The wound measures 9cm length x 6cm width x 0.1cm depth; 42.412cm^2 area and 4.241cm^3 volume. There is no tunneling or undermining noted. There is a medium amount of serosanguineous drainage noted. There is no granulation within the wound bed. There is no necrotic tissue within the wound bed. 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 I am going to suggest that we have the patient continue to monitor for any signs of infection or worsening in general. Obviously if anything changes she should contact the office and let me know. 2. I am good recommend as well that the patient should continue with the [facility] on the right leg and on the left leg reason silver alginate that seems to be doing a good job here. 3. I am also going to suggest patient should continue to utilize the 3 layer compression wraps which I think is doing a good job keeping her edema under control hopefully will be able to get these wounds healed soon. With that being said we did identify today that she is on medication for overactive bladder. I am wondering if this is really a medicine that she should still be taking at this point. I am going to recommend that she talk to her urologist about this tomorrow. We will see patient back for reevaluation in 1 week here in the clinic. If anything  worsens or changes patient will contact our office for additional recommendations. Electronic Signature(s) Signed: 08/12/2022 3:41:14 PM By: Worthy Keeler PA-C Entered By: Worthy Keeler on 08/12/2022 15:41:14 [name] ([id number]) [id number] Page 13 of 13 -------------------------------------------------------------------------------- SuperBill Details Patient Name: Date of Service: [name] 08/12/2022 Medical Record Number: [id number] Patient Account Number: [id number] Date of Birth/Sex: Treating RN: 04/02/1946 (76 y.o. [name] Primary Care Provider: [name] Other Clinician: Referring Provider: Treating Provider/Extender: Mayer Masker, Ngwe Weeks in Treatment: 42 Diagnosis Coding ICD-10 Codes Code Description I89.0 Lymphedema, not elsewhere classified I87.332 Chronic venous hypertension (idiopathic) with ulcer and inflammation of left lower extremity L97.812 Non-pressure chronic ulcer of other part of right lower leg with fat layer exposed Facility Procedures : CPT4: Code [id number] Description: 81  BILATERAL: Application of multi-layer venous compression system; leg (below knee), including ankle and t. Modifier: Quantity: 1 Physician Procedures : CPT4 Code Description Modifier [id number] - WC PHYS LEVEL 3 - EST PT ICD-10 Diagnosis Description I89.0 Lymphedema, not elsewhere classified I87.332 Chronic venous hypertension (idiopathic) with ulcer and inflammation of left lower extremity L97.812  Non-pressure chronic ulcer of other part of right lower leg with fat layer exposed Quantity: 1 Electronic Signature(s) Signed: 08/12/2022 4:08:29 PM By: Carlene Coria RN Signed: 08/12/2022 4:28:12 PM By: Worthy Keeler PA-C Previous Signature: 08/12/2022 3:41:28 PM Version By: Worthy Keeler PA-C Entered By: Carlene Coria on 08/12/2022 16:08:29

## 2022-08-13 ENCOUNTER — Other Ambulatory Visit: Payer: Self-pay | Admitting: Oncology

## 2022-08-13 NOTE — Telephone Encounter (Signed)
Please see below.

## 2022-08-14 ENCOUNTER — Ambulatory Visit (INDEPENDENT_AMBULATORY_CARE_PROVIDER_SITE_OTHER): Payer: Medicare Other | Admitting: Urology

## 2022-08-14 DIAGNOSIS — N2889 Other specified disorders of kidney and ureter: Secondary | ICD-10-CM | POA: Diagnosis not present

## 2022-08-14 NOTE — Progress Notes (Signed)
08/14/2022 12:47 PM   [name] 06-May-1946 [id number]  Referring provider: Donnie Coffin, MD [address] [place],  [place] [address]  Chief Complaint  Patient presents with   renal mass    HPI: 76 year old female followed by medical oncology for personal history of breast cancer with multiple medical comorbidities outlined below including CHF who presents today for further evaluation of incidental renal mass.  She underwent CT of the abdomen with above contrast which led to an abdominal MRI with and without contrast which was completed on 06/18/2022.  This indicated a mildly hyperintense lesion on T1 weighted sequence measuring 1.5 x 1.9 cm.  It had some minimal internal enhancement on postcontrast which was somewhat suspicious for underlying RCC.  She also had a Bosniak cyst which was larger but Bosniak 1.  She denies any flank pain.  She does have significant urinary symptoms including urgency frequency and urge incontinence was recently started on oxybutynin for this.  She is on diuretics secondary to CHF.   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    Breast cancer ([place]) 2019   right breast   CHF (congestive heart failure) ([place])    March 28, 2017 echo: EF 50-55%; 1) echo 07/2011: EF 20-25%, mild concentric hypertrophy, diffuse HK, regional WMA cannot be excluded, grade 1 DD, mitral valve with mild regurg, LA mildly dilated). 2) EF 40-45%, mild AS,                     Chronic kidney disease    Complication of anesthesia 2006   pt states "hard to wake up" after L breast surgery    Edema    GERD (gastroesophageal reflux disease)    Headache    Heart murmur    Hypertension    Hypokinesis    global, EF 35-45 % from Echo.    Malignant neoplasm of breast (female), unspecified site 06/14/2018   RIGHT T1b, N0; ER/PR positive, HER-2 negative.   Neuropathy    Obesity    Pain in joint, site unspecified    Personal history of chemotherapy 2006   left breast   Personal history of radiation therapy 2019   right breast   Personal history of radiation therapy 2006   left breast   Pneumonia    Sleep apnea    Tinea pedis    Unspecified hearing loss    bilateral    Surgical History: Past Surgical History:  Procedure Laterality Date   BREAST BIOPSY Right 2019   11 oc-INVASIVE MAMMARY CARCINOMA, NO SPECIAL TYPE   BREAST BIOPSY Right 2019   1030 oc- neg   BREAST LUMPECTOMY  2006   left breast    CATARACT EXTRACTION     left eye   HERNIA REPAIR  05/25/2006   Incarcerated omentum in ventral hernia, Ventralight mesh, combined lap/ open with omental resection.    PARTIAL MASTECTOMY WITH NEEDLE LOCALIZATION Right 06/14/2018   Procedure: PARTIAL MASTECTOMY WITH NEEDLE LOCALIZATION;  Surgeon: Robert Bellow, MD;  Location: [facility] ORS;  Service: General;  Laterality: Right;   PORT-A-CATH REMOVAL  05/25/2006   SENTINEL NODE BIOPSY Right 06/14/2018   Procedure: SENTINEL NODE BIOPSY;  Surgeon: Robert Bellow, MD;  Location: [facility] ORS;  Service: General;  Laterality: Right;   TUBAL LIGATION      Home Medications:  Allergies as of 08/14/2022   No Known Allergies      Medication List        Accurate as of August 14, 2022 12:47 PM. If you have any questions, ask your nurse or doctor.          acetaminophen-codeine 300-30 MG tablet Commonly known as: TYLENOL #3 Take 1-2 tablets by mouth every 4 (four) hours as needed for moderate pain.   anastrozole 1 MG tablet Commonly known as: ARIMIDEX TAKE 1 TABLET BY MOUTH ONCE DAILY. MUST MAKE AN APPOINTMENT TO SEE DR. RAO.   aspirin EC 81 MG tablet Take 81 mg by mouth daily.   Bladder Control Pads Ex Absorb Misc Apply 1 Dose topically as needed.   cloNIDine  0.1 MG tablet Commonly known as: CATAPRES Take 1 tablet (0.1 mg total) by mouth 2 (two) times daily as needed (systolic BP > 335 or diastolic BP > 95).   [employer] Calcium + D3 600-20 MG-MCG Tabs Generic drug: Calcium Carb-Cholecalciferol TAKE 1 TABLET BY MOUTH TWICE A DAY   diclofenac Sodium 1 % Gel Commonly known as: VOLTAREN Apply 1 application topically as needed.   Docusate Sodium 100 MG capsule Take 100 mg by mouth 2 (two) times daily as needed.   empagliflozin 10 MG Tabs tablet Commonly known as: Jardiance Take 1 tablet (10 mg total) by mouth daily before breakfast.   isosorbide mononitrate 60 MG 24 hr tablet Commonly known as: Imdur Take 1 tablet (60 mg total) by mouth 2 (two) times daily.   loratadine 10 MG tablet Commonly known as: CLARITIN Take 10 mg by mouth daily.   losartan 25 MG tablet Commonly known as: COZAAR Take 1 tablet (25 mg total) by mouth daily.   methocarbamol 500 MG tablet Commonly known as: ROBAXIN Take by mouth.   MiraLax 17 GM/SCOOP powder Generic drug: polyethylene glycol powder SMARTSIG:2 Scoopful By Mouth Daily PRN   omeprazole 40 MG capsule Commonly known as: PRILOSEC Take 40 mg by mouth daily.   oxybutynin 5 MG tablet Commonly known as: DITROPAN Take 5 mg by mouth 2 (two) times daily.   potassium chloride SA 20 MEQ tablet Commonly known as: KLOR-CON M Take 1 tablet (20 mEq total) by mouth 2 (two) times daily.   Torsemide 40 MG Tabs Take 40 mg by mouth daily in the afternoon.        Allergies: No Known Allergies  Family History: Family History  Problem Relation Age of Onset   Hypertension Mother        deceased 72   Cataracts Mother    Breast cancer Other 68       maternal half-sister; deceased 46   Coronary artery disease Other     Social History:  reports that she quit smoking about 22 years ago. Her smoking use included cigarettes. She has a 15.00 pack-year smoking history. She has never used smokeless tobacco. She  reports that she does not currently use drugs after having used the following drugs: Marijuana. She reports that she does not drink alcohol.   Physical Exam: Constitutional:  Alert and oriented, No acute distress.  In wheelchair today. HEENT: [place] AT, moist mucus membranes.  Trachea midline, no masses. Cardiovascular: No clubbing, cyanosis, or edema. Respiratory: Normal respiratory effort, no increased work of breathing. Neurologic: Grossly intact, no focal deficits, moving all 4 extremities. Psychiatric: Normal mood and affect.  Laboratory Data: Lab Results  Component Value Date   WBC 8.4 06/13/2022   HGB 11.7 (L) 06/13/2022   HCT 37.4 06/13/2022   MCV 87.6 06/13/2022   PLT 334 06/13/2022    Lab Results  Component Value Date   CREATININE 1.57 (H) 07/03/2022    Lab Results  Component Value Date   HGBA1C 5.8 (H) 03/28/2017    Pertinent Imaging:  IMPRESSION: 1. Exam detail is significantly diminished secondary to diffuse motion artifact and body habitus. 2. The indeterminate lesion within the lateral cortex of the inferior right kidney is again noted. This exhibits mild internal enhancement on the postcontrast sequences. This is concerning for a small renal cell carcinoma. Recommend referral to urology for further management. 3. There is a 2.1 x 1.8 cm ovoid mass within the right iliac fossa corresponding to the recent CT abnormality. This does not appear significantly changed compared with the previous exam. There is diffuse internal enhancement within this structure on the postcontrast images. This does not have the typical appearance of a benign lymph node. This is an indeterminate finding may reflect a benign or malignant process process. Consider further evaluation with PET-CT. Alternatively, a more conservative approach would include short-term interval follow-up with contrast enhanced CT of the abdomen pelvis in 3 months to assess for temporal change in  the appearance of this abnormality. 4. 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 The largest appears subserosal arising off the anterior fundus measuring 2.6 x 2.5 by 2.9 cm. There are at least 2 submucosal fibroids identified. 5. Cystic structure within the head of pancreas measures 7 mm. According to consensus criteria follow-up imaging in 24 months with pancreas protocol MRI is recommended. This recommendation follows ACR consensus guidelines: Management of Incidental Pancreatic Cysts: A White Paper of the ACR Incidental Findings Committee. [place] 2481;85:909-311. 6. Moderate stool burden throughout the colon. 7. Aortic atherosclerosis.     Electronically Signed   By: Kerby Moors M.D.   On: 06/18/2022 06:40  I personally reviewed the above study and agree with radiologic interpretation.  Assessment & Plan:    1. Right renal mass Relatively small incidental right renal mass with modest enhancement, suspicious for possible small underlying RCC  A solid renal mass raises the suspicion of primary renal malignancy.  We discussed this in detail and in regards to the spectrum of renal masses which includes cysts (pure cysts are considered benign), solid masses and everything in between. The risk of metastasis increases as the size of solid renal mass increases. In general, it is believed that the risk of metastasis for renal masses less than 3-4 cm is small (up to approximately 5%) based mainly on large retrospective studies. In some cases and especially in patients of older age and multiple comorbidities a surveillance approach may be appropriate. The treatment of solid renal masses includes: surveillance, cryoablation (percutaneous and laparoscopic) in addition to partial and complete nephrectomy (each with option of laparoscopic, robotic and open depending on appropriateness). Furthermore, nephrectomy appears to be an  independent risk factor for the development of chronic kidney disease suggesting that nephron sparing approaches should be implored whenever feasible. We reviewed these options in context of the patients current situation as well as the pros and cons of each.  In light of the small size of this lesion as well as her medical comorbidities, have strongly recommended surveillance to which she is agreeable.  Will plan for repeat MRI in 6 months.  - MR Abdomen  W Wo Contrast; Future  Return in about 6 months (around 02/13/2023) for follow up MRI.  Hollice Espy, MD  [facility] [address] [place], [place] [address] [phone]  I spent 46 total minutes on the day of the encounter including pre-visit review of the medical record, face-to-face time with the patient, and post visit ordering of labs/imaging/tests.

## 2022-08-15 ENCOUNTER — Telehealth: Payer: Self-pay | Admitting: Cardiovascular Disease

## 2022-08-15 NOTE — Telephone Encounter (Signed)
LVM to schedule appt please schedule.

## 2022-08-16 ENCOUNTER — Other Ambulatory Visit: Payer: Self-pay | Admitting: Cardiovascular Disease

## 2022-08-19 ENCOUNTER — Encounter: Payer: Medicare Other | Admitting: Physician Assistant

## 2022-08-19 DIAGNOSIS — I890 Lymphedema, not elsewhere classified: Secondary | ICD-10-CM | POA: Diagnosis not present

## 2022-08-19 NOTE — Progress Notes (Addendum)
CHIVONNE, RASCON ([id number]) [id number] Page 1 of 14 Visit Report for 08/19/2022 Chief Complaint Document Details Patient Name: Date of Service: [name] 08/19/2022 2:15 PM Medical Record Number: [id number] Patient Account Number: [id number] Date of Birth/Sex: Treating RN: 01/03/1946 (76 y.o. [name] Primary Care Provider: [name] Other Clinician: Massie Kluver Referring Provider: Treating Provider/Extender: Mayer Masker, Ngwe Weeks in Treatment: 34 Information Obtained from: Patient Chief Complaint Bilateral LE Ulcers Electronic Signature(s) Signed: 08/19/2022 2:47:06 PM By: Worthy Keeler PA-C Entered By: Worthy Keeler on 08/19/2022 14:47:06 -------------------------------------------------------------------------------- HPI Details Patient Name: Date of Service: [name] 08/19/2022 2:15 PM Medical Record Number: [id number] Patient Account Number: [id number] Date of Birth/Sex: Treating RN: February 12, 1946 (76 y.o. [name] Primary Care Provider: [name] Other Clinician: Massie Kluver Referring Provider: Treating Provider/Extender: Mayer Masker, Ngwe Weeks in Treatment: 93 History of Present Illness HPI Description: 03/15/2020 upon evaluation today patient presents today for initial evaluation here in our clinic concerning a wound that is on the left posterior lower extremity. Unfortunately this has been giving the patient some discomfort at this point which she notes has been affected her pretty much daily. The patient does have a history of venous insufficiency, hypertension, congestive heart failure, and a history of breast cancer. Fortunately there does not appear to be evidence of active infection at this time which is great news. No fevers, chills, nausea, vomiting, or diarrhea. Wound is not extremely large but does have some slough covering the surface of the wound. This is going require  sharp debridement today. 03/15/2020 upon evaluation today patient appears to be doing fairly well in regard to her wound. She does have some slough noted on the surface of the wound currently but I do believe the Iodoflex has been beneficial over the past week. There is no signs of active infection at this time which is great news and overall very pleased with the progress. No fevers, chills, nausea, vomiting, or diarrhea. 04/02/2020 upon evaluation today patient appears to be doing a little better in regard to her wound size wise this is not tremendously smaller she does have some slough buildup on the surface of the wound. With that being said this is going require some sharp debridement today to clear away some of the necrotic debris. Fortunately there is no evidence of active infection at this time. No fevers, chills, nausea, vomiting, or diarrhea. 04/10/2020 on evaluation today patient appears to be doing well with regard to her ulcer which is measuring somewhat smaller today. She actually has more granulation tissue noted which is also good news is no need for sharp debridement today. Fortunately there is no evidence of infection either which is also excellent. No fevers, chills, nausea, vomiting, or diarrhea. [name], [name] ([id number]) [id number] Page 2 of 14 04/26/2020 on evaluation today patient's wound actually is showing signs of good improvement which is great news. There does not appear to be any evidence of active infection. I do believe she is tolerating the collagen at this point. 05/03/2020 upon evaluation today patient's wound actually showed signs of good granulation at this time there does not appear to be any evidence of active infection which is great news and overall very pleased with where things stand. With that being said I do believe that the patient is can require some sharp debridement today but fortunately nothing too significant. 05/11/20 on  evaluation today patient appears to be doing well in regard to her leg  ulcer. She's been tolerating the dressing changes without complication. Fortunately there is no signs of active infection at this time. Overall I'm very pleased with where things stand. 05/18/2020 upon evaluation today patient's wound is showing signs of improvement is very dry and I think the collagen for the most part just dried to the wound bed. I am going to clean this away with sharp debridement in order to get this under control in my opinion. 05/25/2020 upon evaluation today patient actually appears to be doing well in regard to her leg ulcer. In fact upon inspection it appears she could potentially be completely healed but that is not guaranteed based on what we are seeing. There does not appear to be any signs of active infection which is great news. 05/31/2020 on evaluation today patient appears to be doing well with regard to her wounds. She in fact appears to be almost completely healed on the left leg there is just a very small opening remaining point 1 06/08/2020 upon evaluation today patient appears to be doing excellent in regard to her leg ulcer on the left in fact it appears to be that she is completely healed as of today. I see no signs of active infection at this time which is great news. No fevers, chills, nausea, vomiting, or diarrhea. READMISSION 01/16/2021 Patient that we have had in this clinic with a wound on the left posterior calf in the summer 2021 extending into October. She was felt to have chronic venous insufficiency. Per the patient's description she was discharged with what sounds like external compression stockings although she could not afford the "$75 per leg". She therefore did not get anything. Apparently her wound that she has currently is been in existence since January. She has been followed at vein and vascular with Unna boots and I think calcium alginate. She had ABIs done on 06/25/2020 that  showed an noncompressible ABI on the right leg and the left leg but triphasic waveforms with good great toe pressures and a TBI of 0.90 on the right and 0.91 on the left Surprisingly the patient also had venous reflux studies that were really unremarkable. This included no evidence of DVT or SVT but there was no evidence of s s deep venous insufficiency or superficial venous insufficiency in the greater saphenous or short saphenous veins bilaterally. I would wonder about more central venous issues or perhaps this is all lymphedema 01/25/2021 upon evaluation today patient appears to be doing decently well and guard to her wound. The good news is the Iodoflex that is job she saw Dr. Dellia Nims last week for readmission and to be honest I think that this has done extremely well over that week. I do believe that she can tolerate the sharp debridement today which will be good. I think that cleaning off the wound will likely allow [place] to be able to use a different type of dressing I do not think the Iodoflex will even be necessary based on how clean the wound looks. Fortunately there is no sign of active infection at this time which is great news. No fevers, chills, nausea, vomiting, or diarrhea. 02/12/2021 upon evaluation today patient appears to be doing well with regard to her leg ulcer. We did switch to alginate when she came for nurse visit I think is doing much better for her. Fortunately there does not appear to be any signs of active infection at this time which is great news. No fevers, chills, nausea, vomiting, or diarrhea. 02/18/2021 upon  evaluation today patient's wound is actually showing signs of good improvement. I am very pleased with where things stand currently. I do not see any signs of active infection which is great news and overall I feel like she is making good progress. The patient likewise is happy that things are doing so well and that this is measuring somewhat smaller. 02/25/2021 upon  evaluation today patient actually appears to be doing quite well in regard to her wounds. She has been tolerating the dressing changes without complication. Fortunately there does not appear to be any signs of active infection which is great news and overall I am extremely pleased with where things stand. No fevers, chills, nausea, vomiting, or diarrhea. She does have a lot of edema I really do think she needs compression socks to be worn in order to prevent things from causing additional issues for her currently. Especially on the right leg she should be wearing compression socks all the time to be honest. 03/04/2021 upon evaluation today patient's leg ulcer actually appears to be doing pretty well which is great news. There does not appear to be any significant change but overall the appearance is better even though the size is not necessarily reflecting a great improvement. Fortunately there does not appear to be any signs of active infection. No fevers, chills, nausea, vomiting, or diarrhea. 03/12/2021 upon evaluation today patient appears to be doing well with regard to her wounds. She has been tolerating the dressing changes without complication. The good news is that the wound on her left lateral leg is doing much better and is showing signs of good improvement. Overall her swelling is controlled well as well. She does need some compression socks she plans to go Jefferey socks next week. 03/18/2021 on evaluation today patient's wound actually is showing signs of good improvement. She does have a little bit of slough this can require some sharp debridement today. 03/25/2021 upon evaluation today patient appears to be doing well with regard to her wound on the left lateral leg. She has been tolerating the dressing changes without complication. Fortunately there does not appear to be any signs of active infection at this time. No fever chills no 04/01/2021 upon evaluation today patient's wound is showing  signs of improvement and overall very pleased with where things stand at this point. There is no evidence of active infection which is great news as well and in general I think that she is making great progress. 04/09/2021 upon evaluation today patient appears to be doing well with regard to her ankle ulcer. She is making good progress currently which is great news. There does not appear to be any signs of infection also excellent news. In general I am extremely pleased with where things stand today. No fevers, chills, nausea, vomiting, or diarrhea. 04/23/2021 upon inspection today patient appears to be doing quite well with regard to her leg ulcer. She has been tolerating the dressing changes without complication. She is can require some sharp debridement today but overall seems to be doing excellent. 05/06/2021 upon evaluation today patient unfortunately appears to be doing poorly overall in regard to her swelling. She is actually significantly more swollen than last week despite the fact that her wound is doing better this is not a good trend. I think that she probably needs to see her cardiologist ASAP and I discussed that with her today. This includes the fact that honestly I think she probably is getting need an increase in her diuretic or something  to try to clear away some of the excess fluid that she is experiencing at the moment. She is not been doing her pumps even twice a day but again right now more concerned that if she were to do the pump she would fluid overload her lungs causing other issues as far as her breathing is concerned. Obviously I think she needs to contact them and move up the appointment for the 12th of something sooner 2 weeks is a bit too long to wait with how swollen she has. 05/14/2021 upon evaluation today patient's wound actually showing signs of being a little bit smaller compared to previous. She has been making good progress however. Fortunately there does not appear to  be any evidence of active infection which is great. Overall I am extremely pleased in that regard. Nonetheless I am still concerned that she is having quite a bit of issue as far as the swelling is concerned she has been placed on fluid restriction as well as an increase in the furosemide by her doctor. We will see how things progress. 05/21/2021 upon evaluation today patient appears to be doing well with regard to her wound in general. She has been tolerating the dressing changes without [name], [name] ([id number]) [id number] Page 3 of 14 complication. With that being said she has been using silver cell for a while and while this was showing signs of improvement it is now gotten to the point where this has slowed down quite significantly. I do believe that switching to a different dressing may be beneficial for her today. 05/28/2021 upon evaluation today patient actually appears to be doing quite well in regard to her wound. In fact there is really not any need for sharp debridement today based on what I see. The Hydrofera Blue is doing great and overall I think that she is managing quite nicely with the compression wrapping. Her edema is still down quite a bit with the wrapping in place. 06/11/2021 upon evaluation today patient appears to be doing well with regard to her leg ulcer. She has been tolerating the dressing changes without complication. Fortunately there does not appear to be any signs of active infection at this time which is great news. No fevers, chills, nausea, vomiting, or diarrhea. She is going require some sharp debridement today. 10/11; left leg ulcer much better looking and with improved surface area. She is using Hydrofera Blue under 3 layer compression. She does not have any stocking on the right leg which in itself has very significant nonpitting edema 06/25/2021 upon evaluation today patient appears to be doing well with regard to the wound on her  left leg. She has been tolerating the dressing changes without complication and overall I am extremely pleased with where things stand today. I do not see any evidence of infection which is great news. 07/02/2021 upon evaluation today patient's wound actually showing signs of excellent improvement and actually very pleased with where we stand today and the overall appearance. Fortunately there does not appear to be any signs of active infection. No fevers, chills, nausea, vomiting, or diarrhea. 07/09/2021 upon evaluation today patient appears to be doing better in regard to her wound this is measuring smaller and she is headed in the appropriate direction based on what I am seeing currently. I do not see any evidence of active infection systemically which is great news. 07/16/2021 upon evaluation today patient appears to be doing pretty well currently in regard to her leg ulcer. This is measuring smaller  and looking much better this is great news. Fortunately there does not appear to be any evidence of active infection systemically which is great news as well. 07/23/2021 upon evaluation today patient's wound is actually showing signs of good improvement this is definitely measuring smaller. I do not see any signs of infection which is great news and overall very pleased in that regard. Overall I think that she is doing well with the [facility]. 07/30/2020 upon evaluation today patient appears to be doing well in regard to her wound. She has been tolerating the dressing changes without complication think the Hydrofera Blue at this point is actually causing her to dry out a lot as far as the wound bed is concerned. Subsequently I think that we need to try to see what we can do to improve this. Fortunately there does not appear to be any evidence of active infection at this time which is great news. 08/06/2021 upon evaluation today patient appears to be doing well with regards to her wound. Is not  measuring significantly smaller initial inspection but upon closer inspection she has a lot of new skin growth across the central portion of the wound. I think will get very close to complete resolution. 08/13/2021 upon evaluation today patient appears to be doing well with regard to her wound. In fact this appears to be I believe healed although there is still some question as to whether it is completely so. I am concerned about the fact that to be honest she still has some evidence of dry skin that could be just trapping something underneath I still want to debride anything as I am afraid of causing some damage to the good skin for that reason I Georgina Peer probably monitor for 1 more week before completely closing everything out. 08/20/2021 upon evaluation today patient actually appears to be doing excellent. In fact she appears to be completely healed. This was the case last week as well we just wanted to make sure nothing reopened since she does not really have an ability to put on compression socks this is good to be something we need to make sure was well-healed. Nonetheless I think we have achieved that goal as of today. 12/27; this is a patient we discharged 2 weeks ago with wounds on her left lower leg laterally. She was supposed to get stockings and really did not get them. She developed blistering and reopening probably sometime late last week. She has 3 areas in the same area as previously. The mid calf dimensions on the left went from 39 to 51 cm today. She also has very significant edema on the right leg but as far as I am aware has not had wounds in this area 1/4; patient presents for follow-up. She has tolerated the compression wrap well. She has no issues or complaints today. 09/17/2021 upon evaluation today patient appears to be doing well with regard to her wounds. She has been tolerating the dressing changes without complication. Fortunately there does not appear to be any signs of active  infection at this time. No fevers, chills, nausea, vomiting, or diarrhea. 10/01/2021 upon evaluation today patient appears to be doing well with regard to her wound this is actually measuring better and looking smaller and I am very pleased in that regard. Fortunately I do not see any evidence of active infection locally nor systemically at this point. No fevers, chills, nausea, vomiting, or diarrhea. 10/08/2021 upon evaluation patient's wounds are actually showing some signs of improvement measuring  a little bit smaller and looking a little bit better. Overall I do not think there is any need for sharp debridement today which is good news. No fevers, chills, nausea, vomiting, or diarrhea. 10/14/2021 upon evaluation today patient appears to be doing well with regard to her wound. She has been tolerating the dressing changes without complication and overall I am extremely pleased with where things stand today. There is a little bit of film on the surface of the wounds but this was carefully cleaned away with just saline and gauze she really did not want me to do any sharp debridement today. 10/29/2021 upon evaluation today patient appears to be doing quite well in regard to her wounds. She is actually showing signs of excellent improvement which is great news and overall I feel like we are on the right track at this time. She does have a wound on both legs and she does need something compression wise when she heals for that reason we will get a go and see about ordering her bilateral juxta lite compression wraps. 11/05/2021 upon evaluation today patient appears to be doing well with regard to her wound. She has been tolerating the dressing changes without complication. Fortunately I do not see any evidence of active infection locally nor systemically at this time which is great news. No fevers, chills, nausea, vomiting, or diarrhea. 11/12/2021 upon evaluation today patient appears to be doing well with regard  to her wounds. She is doing excellent on the left on the right this is not doing quite as well there is some slough and biofilm buildup. 3/14; patient presents for follow-up. She has no issues or complaints today. She has tolerated the compression wraps well. 11/26/2021 upon evaluation today patient appears to be doing well with regard to her wounds. Both are showing signs of good improvement which is great news. I do not see any evidence of active infection and overall think she is healing quite nicely. 12/03/2021 upon evaluation today patient appears to be doing well with regard to her wounds. Both are showing signs of excellent improvement I am very pleased with where things stand and I think that she is making good progress here. Fortunately I do not see any signs of active infection locally or systemically which is great news. No fevers, chills, nausea, vomiting, or diarrhea. 12-10-2021 upon evaluation today patient's wounds actually appear to be doing awesome. I am extremely pleased with where we stand I think she is making excellent progress. I do not see any signs of active infection locally or systemically at this time. 12-17-2021 upon evaluation today patient's wounds are actually showing signs of good improvement. Fortunately I do not see any evidence of active infection locally nor systemically which is great news. Overall I do not believe that she is showing any signs of significant worsening which is good news. With that being said I do believe that she has a little bit of film on the surface of the wound I was actually able to clear this away with saline and gauze no sharp debridement was necessary. [name], [name] ([id number]) [id number] Page 4 of 14 12-24-2021 upon evaluation today patient appears to be doing well with regard to her wounds. I do not see any signs of active infection currently which is great news and overall I think that we are on the right  track here. No fevers, chills, nausea, vomiting, or diarrhea. 12-31-2021 upon evaluation today patient appears to be doing well with regard to her wounds.  Both are showing signs of improvement the wound on the right leg is going require some sharp debridement on the left seems to be doing quite well. 01-07-2022 upon evaluation today patient appears to be doing well currently in regard to her wounds. She is actually making good progress bilaterally. Both of them require little bit of sharp debridement but not too much which is great news. Upon inspection patient's wound bed actually showed signs of good granulation and epithelization on both legs. She still is having significant issues with the right leg compared to the left although I do feel like both are actually showing signs of pretty good improvement. 01-21-2022 upon evaluation today patient's wound is actually showing signs of excellent improvement I am very pleased with where we stand currently. I do not see any signs of active infection locally or systemically which is great news. No fevers, chills, nausea, vomiting, or diarrhea. 01-28-2022 upon evaluation today patient appears to be doing well with regard to her wound on the left leg this is pretty much just about healed which is great news. Unfortunately in regard to the wound on her right leg this is not doing nearly as well in fact I think we probably need to see about a culture today that was discussed with her. 02-04-2022 upon evaluation today patient appears to be doing well with regard to her left leg which in fact might actually be completely healed I am not 100% sure. Nonetheless this is shown signs of excellent improvement which is great news. With that being said in regards to the right leg there is some slough and film buildup on the surface of the wound although she is feeling much better than last week this does appear to be doing much better than it was last week. Fortunately there  does not appear to be any signs of active infection locally or systemically at this time. 02-11-2022 upon evaluation today patient's wounds appear to be doing decently well. In fact the left leg is healed although there is brand-new skin were definitely given still need to wrap her for the time being. On the right leg this is showing signs of some epithelial growth in the middle of the wound it still measures the same because there is still a larger area with speckled openings but again as far as the entire area being open this is significantly improved which is great news. I am very pleased. 6/13; left leg remains healed and she has a juxta light to apply to it today. There is some concern about whether she is going to be able to do this at home. She lives with a daughter although she has her own disability. On the right the wound looks smaller to me nice epithelialization good edema control. 02-25-2022 upon evaluation today patient appears to be doing well with regard to her legs the left leg is still healed the right leg is doing much better. I think we are on the right track here. With that being said unfortunately she continues to have significant issues with some other problems and she actually tells me on Saturday she was having lunch and went inside to sit down when she tells me she had all of a sudden a sharp sensation that went from her hand through her arm into her leg and her neck and she tells me that she could not see anything for the next hour it was completely black. Slowly after this her vision started to return and has been  normal since. It sounds to me as if she may have had a TIA or mini stroke at this point. Nonetheless I think that she needs to have this checked out ASAP. Her primary care provider is Dr. Clide Deutscher at [facility]. We will get a try to get in touch with him. 03-10-2022 upon evaluation today patient's wound actually showed signs of good improvement. She seems to be making  excellent progress here. I do not see any evidence of active infection locally or systemically which is great news. No fevers, chills, nausea, vomiting, or diarrhea. She was in the hospital since have seen her last she ended up finding out that she was having syncopal episodes. That is what was going on with the blacking out and losing vision. Subsequently they adjusted some of her medication she seems to be doing better. 03-18-2022 upon evaluation today patient's wound is actually showing signs of good granulation and epithelization at this point. Fortunately I do not see any signs of active infection locally or systemically which is great news. 03-25-2022 any evidence of active infection locally or systemically which is great news. Any evidence of active infection which is great news and overall the wound is looking better. Upon evaluation today patient appears to be doing well with regard to her leg ulcer. She has been tolerating the dressing changes without complication. Fortunately I do not see 7/25; the patient's wound is on the right lateral lower extremity using silver alginate 3 layer compression. She has chronic venous insufficiency and lymphedema. We are using a juxta lite on the left leg which does not have any open wounds but she is leaving this on all week it seems that there just is not a way to get this changed at home 04-08-2022 upon evaluation today patient appears to be doing well currently in regard to her wound. She has been tolerating the dressing changes without complication. Fortunately I do not see any evidence of active infection locally or systemically which is great news. No fever or chills noted her left leg has reopened at this point. 04-15-2022 upon evaluation patient's wounds actually are showing signs of improvement which is great news. Little by little I do believe her making progress here. In regard to the right leg this is looking better though the measurement does not  speak to it there is a lot of new skin growth. On the left leg this is significantly smaller compared to last week most of the blistered area has completely closed. 04-22-2022 upon evaluation today patient appears to be doing excellent in regard to her wounds both are showing signs of improvement. Her knees and above are still extremely swollen the legs were rewrapped them are down significantly. With that being said I do not think that we will get a be able to keep them that way once were not rapid noted that she really has no way to be able to put juxta lites on and off and nobody to help her with that. That is the biggest concern that we have here at this point. Fortunately I do not think that there is any evidence of infection which is great news. 04-29-2022 upon evaluation today patient appears to be doing well with regard to her wounds. She has been tolerating the dressing changes without complication. Fortunately there does not appear to be any evidence of infection locally or systemically which is great news and overall I am extremely pleased in that regard. Left leg is healed the right leg is  very close we discussed today the possibility of her granddaughter helping with putting on and off compression wraps for her. 05-06-2022 upon evaluation today patient appears to be doing well currently in regard to her left leg which is showing signs of being completely healed. With regard to her right leg she still has an open area though this is doing much better there is a lot less drainage than what we previously noted. Fortunately I see no evidence of active infection locally or systemically at this time which is great news. 05-20-2022 upon evaluation today patient appears to be doing excellent in regard to her leg. The left leg is still healed the right leg is much better. Fortunately I see no signs of infection locally or systemically at this time which is great news. No fevers, chills, nausea,  vomiting, or diarrhea. 05-27-2022 upon evaluation today patient appears to be doing excellent in regard to her wound this is actually measuring smaller and looking much better. Fortunately I see no signs of active infection locally or systemically at this time. 06-03-2022 upon evaluation patient's wound actually is measuring smaller and looking much better. She still has a lot of discomfort but fortunately nothing to significant at this point which is great news. No fevers, chills, nausea, vomiting, or diarrhea. I think we are making some pretty good progress here. 06-10-2022 upon evaluation today patient appears to be doing well currently in regard to her wound which is actually measuring somewhat smaller. This is actually 2 separate wounds that are clustered and this is making it appear little bit larger than what it is but nonetheless we are seeing some definite improvement. Fortunately I do not see any signs of active infection at this time. No fevers, chills, nausea, vomiting, or diarrhea. 06-17-2022 upon evaluation today patient appears to be doing well currently in regard to her wound although she was in the hospital since she was last here. [name], [name] ([id number]) [id number] Page 5 of 14 That was from 06-12-2022 through 06-16-2022 which was yesterday. This was due to a CHF exacerbation. With that being said the patient has noted in her record review acute on chronic congestive heart failure with hypertensive emergency noted. She also has stage IIIa chronic kidney disease noted. With that being said I do think her wound is much better because I got a lot of fluid off to try to diurese her to a degree but her kidneys would not hold for that they put her on furosemide 40 mg and that is helping to get the fluid off she is much better but still has a lot of fluid buildup. 06-24-2022 upon evaluation today patient appears to be doing well currently in regard to her wound  is not significantly smaller but it is improved overall. Fortunately I do not see any signs of active infection locally nor systemically at this time. 07-01-2022 upon evaluation today patient appears to be doing okay in regard to her wound I do not believe the silver alginate is helping quite as much as I would like to see. I would actually recommend we switch back over to the [facility] at this point which I think may do better. Fortunately I do not see any signs of infection which is good news were going to switch to Tubigrip on the left leg on the right leg we will get a continue with the compression wrap. 07-08-2022 upon evaluation today patient unfortunately is not doing nearly as well in regard to her left leg. We  had to put her back into a 4-layer compression wrap today due to a significant blister that appeared. I do believe that we need to try to get this to reabsorb we will see how things do over the next week. With that being said the right leg is doing well the 4-layer compression wrap and switch to the [facility] seems to be very beneficial I am pleased with what we are seeing in that regard. 11/7; patient's wounds are on the right posterior lateral leg actually measures smaller and look healthy we have been using Hydrofera Blue under 3 layer compression. Last week she had a large blister that was left intact covered with alginate but the leg was wrapped in 3 layer. Today the left leg had unroofed but the skin still looks reasonably healthy. Nevertheless she is still going to require the left leg to be wrapped 07-22-2022 upon evaluation today patient unfortunately is extremely swollen in regard to her bilateral lower extremities despite the fact that she is currently being compression wrap a 4-layer compression wrap bilaterally. This is unfortunate and the fact that I believe this is not just a lower extremity swelling issue but to be honest this is a much more global issue with  edema in regard to her congestive heart failure. Her BNP has been up in the past month and I am not sure where it is as it has been rechecked she sees the heart failure clinic on Thursday in 2 days. With that being said she does tell me that she is having trouble laying down she tells me that she is having trouble walking as well. With all that being said I advised that she probably needs to go to the hospital for further evaluation. 07-29-2022 upon evaluation today patient appears to be doing well currently in regard to her wound on the right leg I am very pleased the left leg is also doing better though it is weeping quite a bit. I think we need to do something to Help calm this down a bit. She is in agreement with that plan. 08-05-2022 upon evaluation today patient's wounds are showing signs of improvement albeit slowly. Fortunately I do not see any evidence of infection locally nor systemically at this time which is great news. No fevers, chills, nausea, vomiting, or diarrhea. 08-12-2022 upon evaluation today patient actually appears to be doing well currently in regard to her wounds which is showing signs of improvement. Fortunately I do not see any signs of active infection locally nor systemically at this time which is great news. No fevers, chills, nausea, vomiting, or diarrhea. 08-19-2022 upon evaluation today patient appears to be doing decently well in regard to her wounds. She has been tolerating the dressing changes without complication. Fortunately there does not appear to be any signs of active infection locally nor systemically at this time which is great news. No fevers, chills, nausea, vomiting, or diarrhea. Electronic Signature(s) Signed: 08/19/2022 3:22:11 PM By: Worthy Keeler PA-C Entered By: Worthy Keeler on 08/19/2022 15:22:11 -------------------------------------------------------------------------------- Physical Exam Details Patient Name: Date of Service: [name] 08/19/2022 2:15 PM Medical Record Number: [id number] Patient Account Number: [id number] Date of Birth/Sex: Treating RN: 08-31-1946 (76 y.o. [name] Primary Care Provider: [name] Other Clinician: Massie Kluver Referring Provider: Treating Provider/Extender: Mayer Masker, Ngwe Weeks in Treatment: 36 Constitutional Well-nourished and well-hydrated in no acute distress. Respiratory normal breathing without difficulty. Psychiatric this patient is able to make decisions and  demonstrates good insight into disease process. Alert and Oriented x 3. pleasant and cooperative. Notes Upon inspection patient's wound bed actually showed signs of good granulation and epithelization at this point. Fortunately I see no evidence of active infection locally nor systemically which is great news and overall I am extremely pleased with where we stand today. [name], [name] ([id number]) [id number] Page 6 of 14 Electronic Signature(s) Signed: 08/19/2022 3:22:29 PM By: Worthy Keeler PA-C Entered By: Worthy Keeler on 08/19/2022 15:22:29 -------------------------------------------------------------------------------- Physician Orders Details Patient Name: Date of Service: [name] 08/19/2022 2:15 PM Medical Record Number: [id number] Patient Account Number: [id number] Date of Birth/Sex: Treating RN: [birth year] (76 y.o. [name] Primary Care Provider: [name] Other Clinician: Massie Kluver Referring Provider: Treating Provider/Extender: Mayer Masker, Ngwe Weeks in Treatment: 83 Verbal / Phone Orders: No Diagnosis Coding ICD-10 Coding Code Description I89.0 Lymphedema, not elsewhere classified I87.332 Chronic venous hypertension (idiopathic) with ulcer and inflammation of left lower extremity L97.812 Non-pressure chronic ulcer of other part of right lower leg with fat layer exposed Follow-up Appointments Return  Appointment in 1 week. Nurse Visit as needed Bathing/ Shower/ Hygiene May shower with wound dressing protected with water repellent cover or cast protector. No tub bath. Anesthetic (Use 'Patient Medications' Section for Anesthetic Order Entry) Lidocaine applied to wound bed Edema Control - Lymphedema / Segmental Compressive Device / Other Bilateral Lower Extremities Optional: One layer of unna paste to top of compression wrap (to act as an anchor). Elevate, Exercise Daily and A void Standing for Long Periods of Time. Elevate legs to the level of the heart and pump ankles as often as possible Elevate leg(s) parallel to the floor when sitting. Compression Pump: Use compression pump on left lower extremity for 60 minutes, twice daily. - You may pump over wraps Compression Pump: Use compression pump on right lower extremity for 60 minutes, twice daily. - You may pump over wraps Wound Treatment Wound #4 - Lower Leg Wound Laterality: Right, Lateral Cleanser: Soap and Water 1 x Per Week/30 Days Discharge Instructions: Gently cleanse wound with antibacterial soap, rinse and pat dry prior to dressing wounds Cleanser: Wound Cleanser 1 x Per Week/30 Days Discharge Instructions: Wash your hands with soap and water. Remove old dressing, discard into plastic bag and place into trash. Cleanse the wound with Wound Cleanser prior to applying a clean dressing using gauze sponges, not tissues or cotton balls. Do not scrub or use excessive force. Pat dry using gauze sponges, not tissue or cotton balls. Prim Dressing: Silvercel Small 2x2 (in/in) 1 x Per Week/30 Days ary Discharge Instructions: Apply Silvercel Small 2x2 (in/in) as instructed Compression Wrap: 3-LAYER WRAP - Profore Lite LF 3 Multilayer Compression Bandaging System 1 x Per Week/30 Days Discharge Instructions: Apply 3 multi-layer wrap as prescribed. Wound #6 - Lower Leg Wound Laterality: Left, Medial Cleanser: Soap and Water 1 x Per Week/30  Days Discharge Instructions: Gently cleanse wound with antibacterial soap, rinse and pat dry prior to dressing wounds LUELLEN, HOWSON ([id number]) [id number] Page 7 of 14 Cleanser: Wound Cleanser 1 x Per Week/30 Days Discharge Instructions: Wash your hands with soap and water. Remove old dressing, discard into plastic bag and place into trash. Cleanse the wound with Wound Cleanser prior to applying a clean dressing using gauze sponges, not tissues or cotton balls. Do not scrub or use excessive force. Pat dry using gauze sponges, not tissue or cotton balls. Prim Dressing: Silvercel 4 1/4x 4 1/4 (in/in) 1 x  Per Week/30 Days ary Discharge Instructions: Apply Silvercel 4 1/4x 4 1/4 (in/in) as instructed Secondary Dressing: ABD Pad 5x9 (in/in) 1 x Per Week/30 Days Discharge Instructions: Cover with ABD pad Compression Wrap: 3-LAYER WRAP - Profore Lite LF 3 Multilayer Compression Bandaging System 1 x Per Week/30 Days Discharge Instructions: Apply 3 multi-layer wrap as prescribed. Electronic Signature(s) Signed: 08/19/2022 5:45:58 PM By: Worthy Keeler PA-C Signed: 08/26/2022 4:45:28 PM By: Massie Kluver Entered By: Massie Kluver on 08/19/2022 15:13:12 -------------------------------------------------------------------------------- Problem List Details Patient Name: Date of Service: [name] 08/19/2022 2:15 PM Medical Record Number: [id number] Patient Account Number: [id number] Date of Birth/Sex: Treating RN: [birth year] (76 y.o. [name] Primary Care Provider: [name] Other Clinician: Massie Kluver Referring Provider: Treating Provider/Extender: Mayer Masker, Ngwe Weeks in Treatment: 74 Active Problems ICD-10 Encounter Code Description Active Date MDM Diagnosis I89.0 Lymphedema, not elsewhere classified 01/16/2021 No Yes I87.332 Chronic venous hypertension (idiopathic) with ulcer and inflammation of left 01/16/2021 No  Yes lower extremity L97.812 Non-pressure chronic ulcer of other part of right lower leg with fat layer 01/07/2022 No Yes exposed Inactive Problems ICD-10 Code Description Active Date Inactive Date L97.822 Non-pressure chronic ulcer of other part of left lower leg with fat layer exposed 01/16/2021 01/16/2021 Resolved Problems Electronic Signature(s) AMANDA, STEUART ([id number]) [id number] Page 8 of 14 Signed: 08/19/2022 5:45:58 PM By: Worthy Keeler PA-C Signed: 08/26/2022 4:45:28 PM By: Massie Kluver Previous Signature: 08/19/2022 2:47:02 PM Version By: Worthy Keeler PA-C Entered By: Massie Kluver on 08/19/2022 16:06:33 -------------------------------------------------------------------------------- Progress Note Details Patient Name: Date of Service: [name] 08/19/2022 2:15 PM Medical Record Number: [id number] Patient Account Number: [id number] Date of Birth/Sex: Treating RN: [birth year] (76 y.o. [name] Primary Care Provider: [name] Other Clinician: Massie Kluver Referring Provider: Treating Provider/Extender: Mayer Masker, Ngwe Weeks in Treatment: 53 Subjective Chief Complaint Information obtained from Patient Bilateral LE Ulcers History of Present Illness (HPI) 03/15/2020 upon evaluation today patient presents today for initial evaluation here in our clinic concerning a wound that is on the left posterior lower extremity. Unfortunately this has been giving the patient some discomfort at this point which she notes has been affected her pretty much daily. The patient does have a history of venous insufficiency, hypertension, congestive heart failure, and a history of breast cancer. Fortunately there does not appear to be evidence of active infection at this time which is great news. No fevers, chills, nausea, vomiting, or diarrhea. Wound is not extremely large but does have some slough covering the surface of the  wound. This is going require sharp debridement today. 03/15/2020 upon evaluation today patient appears to be doing fairly well in regard to her wound. She does have some slough noted on the surface of the wound currently but I do believe the Iodoflex has been beneficial over the past week. There is no signs of active infection at this time which is great news and overall very pleased with the progress. No fevers, chills, nausea, vomiting, or diarrhea. 04/02/2020 upon evaluation today patient appears to be doing a little better in regard to her wound size wise this is not tremendously smaller she does have some slough buildup on the surface of the wound. With that being said this is going require some sharp debridement today to clear away some of the necrotic debris. Fortunately there is no evidence of active infection at this time. No fevers, chills, nausea, vomiting, or diarrhea. 04/10/2020 on evaluation today  patient appears to be doing well with regard to her ulcer which is measuring somewhat smaller today. She actually has more granulation tissue noted which is also good news is no need for sharp debridement today. Fortunately there is no evidence of infection either which is also excellent. No fevers, chills, nausea, vomiting, or diarrhea. 04/26/2020 on evaluation today patient's wound actually is showing signs of good improvement which is great news. There does not appear to be any evidence of active infection. I do believe she is tolerating the collagen at this point. 05/03/2020 upon evaluation today patient's wound actually showed signs of good granulation at this time there does not appear to be any evidence of active infection which is great news and overall very pleased with where things stand. With that being said I do believe that the patient is can require some sharp debridement today but fortunately nothing too significant. 05/11/20 on evaluation today patient appears to be doing well in regard  to her leg ulcer. She's been tolerating the dressing changes without complication. Fortunately there is no signs of active infection at this time. Overall I'm very pleased with where things stand. 05/18/2020 upon evaluation today patient's wound is showing signs of improvement is very dry and I think the collagen for the most part just dried to the wound bed. I am going to clean this away with sharp debridement in order to get this under control in my opinion. 05/25/2020 upon evaluation today patient actually appears to be doing well in regard to her leg ulcer. In fact upon inspection it appears she could potentially be completely healed but that is not guaranteed based on what we are seeing. There does not appear to be any signs of active infection which is great news. 05/31/2020 on evaluation today patient appears to be doing well with regard to her wounds. She in fact appears to be almost completely healed on the left leg there is just a very small opening remaining point 1 06/08/2020 upon evaluation today patient appears to be doing excellent in regard to her leg ulcer on the left in fact it appears to be that she is completely healed as of today. I see no signs of active infection at this time which is great news. No fevers, chills, nausea, vomiting, or diarrhea. READMISSION 01/16/2021 Patient that we have had in this clinic with a wound on the left posterior calf in the summer 2021 extending into October. She was felt to have chronic venous insufficiency. Per the patient's description she was discharged with what sounds like external compression stockings although she could not afford the "$75 per leg". She therefore did not get anything. Apparently her wound that she has currently is been in existence since January. She has been followed at vein and vascular with Unna boots and I think calcium alginate. She had ABIs done on 06/25/2020 that showed an noncompressible ABI on the right leg and the left  leg but triphasic waveforms with good great toe pressures and a TBI of 0.90 on the right and 0.91 on the left Surprisingly the patient also had venous reflux studies that were really unremarkable. This included no evidence of DVT or SVT but there was no evidence of s s [name], [name] ([id number]) [id number] Page 9 of 14 deep venous insufficiency or superficial venous insufficiency in the greater saphenous or short saphenous veins bilaterally. I would wonder about more central venous issues or perhaps this is all lymphedema 01/25/2021 upon evaluation today patient appears to be  doing decently well and guard to her wound. The good news is the Iodoflex that is job she saw Dr. Dellia Nims last week for readmission and to be honest I think that this has done extremely well over that week. I do believe that she can tolerate the sharp debridement today which will be good. I think that cleaning off the wound will likely allow [place] to be able to use a different type of dressing I do not think the Iodoflex will even be necessary based on how clean the wound looks. Fortunately there is no sign of active infection at this time which is great news. No fevers, chills, nausea, vomiting, or diarrhea. 02/12/2021 upon evaluation today patient appears to be doing well with regard to her leg ulcer. We did switch to alginate when she came for nurse visit I think is doing much better for her. Fortunately there does not appear to be any signs of active infection at this time which is great news. No fevers, chills, nausea, vomiting, or diarrhea. 02/18/2021 upon evaluation today patient's wound is actually showing signs of good improvement. I am very pleased with where things stand currently. I do not see any signs of active infection which is great news and overall I feel like she is making good progress. The patient likewise is happy that things are doing so well and that this is measuring somewhat  smaller. 02/25/2021 upon evaluation today patient actually appears to be doing quite well in regard to her wounds. She has been tolerating the dressing changes without complication. Fortunately there does not appear to be any signs of active infection which is great news and overall I am extremely pleased with where things stand. No fevers, chills, nausea, vomiting, or diarrhea. She does have a lot of edema I really do think she needs compression socks to be worn in order to prevent things from causing additional issues for her currently. Especially on the right leg she should be wearing compression socks all the time to be honest. 03/04/2021 upon evaluation today patient's leg ulcer actually appears to be doing pretty well which is great news. There does not appear to be any significant change but overall the appearance is better even though the size is not necessarily reflecting a great improvement. Fortunately there does not appear to be any signs of active infection. No fevers, chills, nausea, vomiting, or diarrhea. 03/12/2021 upon evaluation today patient appears to be doing well with regard to her wounds. She has been tolerating the dressing changes without complication. The good news is that the wound on her left lateral leg is doing much better and is showing signs of good improvement. Overall her swelling is controlled well as well. She does need some compression socks she plans to go Jefferey socks next week. 03/18/2021 on evaluation today patient's wound actually is showing signs of good improvement. She does have a little bit of slough this can require some sharp debridement today. 03/25/2021 upon evaluation today patient appears to be doing well with regard to her wound on the left lateral leg. She has been tolerating the dressing changes without complication. Fortunately there does not appear to be any signs of active infection at this time. No fever chills no 04/01/2021 upon evaluation today  patient's wound is showing signs of improvement and overall very pleased with where things stand at this point. There is no evidence of active infection which is great news as well and in general I think that she is making great  progress. 04/09/2021 upon evaluation today patient appears to be doing well with regard to her ankle ulcer. She is making good progress currently which is great news. There does not appear to be any signs of infection also excellent news. In general I am extremely pleased with where things stand today. No fevers, chills, nausea, vomiting, or diarrhea. 04/23/2021 upon inspection today patient appears to be doing quite well with regard to her leg ulcer. She has been tolerating the dressing changes without complication. She is can require some sharp debridement today but overall seems to be doing excellent. 05/06/2021 upon evaluation today patient unfortunately appears to be doing poorly overall in regard to her swelling. She is actually significantly more swollen than last week despite the fact that her wound is doing better this is not a good trend. I think that she probably needs to see her cardiologist ASAP and I discussed that with her today. This includes the fact that honestly I think she probably is getting need an increase in her diuretic or something to try to clear away some of the excess fluid that she is experiencing at the moment. She is not been doing her pumps even twice a day but again right now more concerned that if she were to do the pump she would fluid overload her lungs causing other issues as far as her breathing is concerned. Obviously I think she needs to contact them and move up the appointment for the 12th of something sooner 2 weeks is a bit too long to wait with how swollen she has. 05/14/2021 upon evaluation today patient's wound actually showing signs of being a little bit smaller compared to previous. She has been making good progress however.  Fortunately there does not appear to be any evidence of active infection which is great. Overall I am extremely pleased in that regard. Nonetheless I am still concerned that she is having quite a bit of issue as far as the swelling is concerned she has been placed on fluid restriction as well as an increase in the furosemide by her doctor. We will see how things progress. 05/21/2021 upon evaluation today patient appears to be doing well with regard to her wound in general. She has been tolerating the dressing changes without complication. With that being said she has been using silver cell for a while and while this was showing signs of improvement it is now gotten to the point where this has slowed down quite significantly. I do believe that switching to a different dressing may be beneficial for her today. 05/28/2021 upon evaluation today patient actually appears to be doing quite well in regard to her wound. In fact there is really not any need for sharp debridement today based on what I see. The Hydrofera Blue is doing great and overall I think that she is managing quite nicely with the compression wrapping. Her edema is still down quite a bit with the wrapping in place. 06/11/2021 upon evaluation today patient appears to be doing well with regard to her leg ulcer. She has been tolerating the dressing changes without complication. Fortunately there does not appear to be any signs of active infection at this time which is great news. No fevers, chills, nausea, vomiting, or diarrhea. She is going require some sharp debridement today. 10/11; left leg ulcer much better looking and with improved surface area. She is using Hydrofera Blue under 3 layer compression. She does not have any stocking on the right leg which in itself has  very significant nonpitting edema 06/25/2021 upon evaluation today patient appears to be doing well with regard to the wound on her left leg. She has been tolerating the dressing  changes without complication and overall I am extremely pleased with where things stand today. I do not see any evidence of infection which is great news. 07/02/2021 upon evaluation today patient's wound actually showing signs of excellent improvement and actually very pleased with where we stand today and the overall appearance. Fortunately there does not appear to be any signs of active infection. No fevers, chills, nausea, vomiting, or diarrhea. 07/09/2021 upon evaluation today patient appears to be doing better in regard to her wound this is measuring smaller and she is headed in the appropriate direction based on what I am seeing currently. I do not see any evidence of active infection systemically which is great news. 07/16/2021 upon evaluation today patient appears to be doing pretty well currently in regard to her leg ulcer. This is measuring smaller and looking much better this is great news. Fortunately there does not appear to be any evidence of active infection systemically which is great news as well. 07/23/2021 upon evaluation today patient's wound is actually showing signs of good improvement this is definitely measuring smaller. I do not see any signs of infection which is great news and overall very pleased in that regard. Overall I think that she is doing well with the [facility]. 07/30/2020 upon evaluation today patient appears to be doing well in regard to her wound. She has been tolerating the dressing changes without complication think the Hydrofera Blue at this point is actually causing her to dry out a lot as far as the wound bed is concerned. Subsequently I think that we need to try to see what we can do to improve this. Fortunately there does not appear to be any evidence of active infection at this time which is great news. 08/06/2021 upon evaluation today patient appears to be doing well with regards to her wound. Is not measuring significantly smaller initial inspection  but upon [name], [name] ([id number]) [id number] Page 10 of 14 closer inspection she has a lot of new skin growth across the central portion of the wound. I think will get very close to complete resolution. 08/13/2021 upon evaluation today patient appears to be doing well with regard to her wound. In fact this appears to be I believe healed although there is still some question as to whether it is completely so. I am concerned about the fact that to be honest she still has some evidence of dry skin that could be just trapping something underneath I still want to debride anything as I am afraid of causing some damage to the good skin for that reason I Georgina Peer probably monitor for 1 more week before completely closing everything out. 08/20/2021 upon evaluation today patient actually appears to be doing excellent. In fact she appears to be completely healed. This was the case last week as well we just wanted to make sure nothing reopened since she does not really have an ability to put on compression socks this is good to be something we need to make sure was well-healed. Nonetheless I think we have achieved that goal as of today. 12/27; this is a patient we discharged 2 weeks ago with wounds on her left lower leg laterally. She was supposed to get stockings and really did not get them. She developed blistering and reopening probably sometime late last week. She has 3  areas in the same area as previously. The mid calf dimensions on the left went from 39 to 51 cm today. She also has very significant edema on the right leg but as far as I am aware has not had wounds in this area 1/4; patient presents for follow-up. She has tolerated the compression wrap well. She has no issues or complaints today. 09/17/2021 upon evaluation today patient appears to be doing well with regard to her wounds. She has been tolerating the dressing changes without complication. Fortunately there does not  appear to be any signs of active infection at this time. No fevers, chills, nausea, vomiting, or diarrhea. 10/01/2021 upon evaluation today patient appears to be doing well with regard to her wound this is actually measuring better and looking smaller and I am very pleased in that regard. Fortunately I do not see any evidence of active infection locally nor systemically at this point. No fevers, chills, nausea, vomiting, or diarrhea. 10/08/2021 upon evaluation patient's wounds are actually showing some signs of improvement measuring a little bit smaller and looking a little bit better. Overall I do not think there is any need for sharp debridement today which is good news. No fevers, chills, nausea, vomiting, or diarrhea. 10/14/2021 upon evaluation today patient appears to be doing well with regard to her wound. She has been tolerating the dressing changes without complication and overall I am extremely pleased with where things stand today. There is a little bit of film on the surface of the wounds but this was carefully cleaned away with just saline and gauze she really did not want me to do any sharp debridement today. 10/29/2021 upon evaluation today patient appears to be doing quite well in regard to her wounds. She is actually showing signs of excellent improvement which is great news and overall I feel like we are on the right track at this time. She does have a wound on both legs and she does need something compression wise when she heals for that reason we will get a go and see about ordering her bilateral juxta lite compression wraps. 11/05/2021 upon evaluation today patient appears to be doing well with regard to her wound. She has been tolerating the dressing changes without complication. Fortunately I do not see any evidence of active infection locally nor systemically at this time which is great news. No fevers, chills, nausea, vomiting, or diarrhea. 11/12/2021 upon evaluation today patient  appears to be doing well with regard to her wounds. She is doing excellent on the left on the right this is not doing quite as well there is some slough and biofilm buildup. 3/14; patient presents for follow-up. She has no issues or complaints today. She has tolerated the compression wraps well. 11/26/2021 upon evaluation today patient appears to be doing well with regard to her wounds. Both are showing signs of good improvement which is great news. I do not see any evidence of active infection and overall think she is healing quite nicely. 12/03/2021 upon evaluation today patient appears to be doing well with regard to her wounds. Both are showing signs of excellent improvement I am very pleased with where things stand and I think that she is making good progress here. Fortunately I do not see any signs of active infection locally or systemically which is great news. No fevers, chills, nausea, vomiting, or diarrhea. 12-10-2021 upon evaluation today patient's wounds actually appear to be doing awesome. I am extremely pleased with where we stand I think  she is making excellent progress. I do not see any signs of active infection locally or systemically at this time. 12-17-2021 upon evaluation today patient's wounds are actually showing signs of good improvement. Fortunately I do not see any evidence of active infection locally nor systemically which is great news. Overall I do not believe that she is showing any signs of significant worsening which is good news. With that being said I do believe that she has a little bit of film on the surface of the wound I was actually able to clear this away with saline and gauze no sharp debridement was necessary. 12-24-2021 upon evaluation today patient appears to be doing well with regard to her wounds. I do not see any signs of active infection currently which is great news and overall I think that we are on the right track here. No fevers, chills, nausea, vomiting,  or diarrhea. 12-31-2021 upon evaluation today patient appears to be doing well with regard to her wounds. Both are showing signs of improvement the wound on the right leg is going require some sharp debridement on the left seems to be doing quite well. 01-07-2022 upon evaluation today patient appears to be doing well currently in regard to her wounds. She is actually making good progress bilaterally. Both of them require little bit of sharp debridement but not too much which is great news. Upon inspection patient's wound bed actually showed signs of good granulation and epithelization on both legs. She still is having significant issues with the right leg compared to the left although I do feel like both are actually showing signs of pretty good improvement. 01-21-2022 upon evaluation today patient's wound is actually showing signs of excellent improvement I am very pleased with where we stand currently. I do not see any signs of active infection locally or systemically which is great news. No fevers, chills, nausea, vomiting, or diarrhea. 01-28-2022 upon evaluation today patient appears to be doing well with regard to her wound on the left leg this is pretty much just about healed which is great news. Unfortunately in regard to the wound on her right leg this is not doing nearly as well in fact I think we probably need to see about a culture today that was discussed with her. 02-04-2022 upon evaluation today patient appears to be doing well with regard to her left leg which in fact might actually be completely healed I am not 100% sure. Nonetheless this is shown signs of excellent improvement which is great news. With that being said in regards to the right leg there is some slough and film buildup on the surface of the wound although she is feeling much better than last week this does appear to be doing much better than it was last week. Fortunately there does not appear to be any signs of active  infection locally or systemically at this time. 02-11-2022 upon evaluation today patient's wounds appear to be doing decently well. In fact the left leg is healed although there is brand-new skin were definitely given still need to wrap her for the time being. On the right leg this is showing signs of some epithelial growth in the middle of the wound it still measures the same because there is still a larger area with speckled openings but again as far as the entire area being open this is significantly improved which is great news. I am very pleased. 6/13; left leg remains healed and she has a juxta light to apply  to it today. There is some concern about whether she is going to be able to do this at home. She lives with a daughter although she has her own disability. On the right the wound looks smaller to me nice epithelialization good edema control. [name], [name] ([id number]) [id number] Page 11 of 14 02-25-2022 upon evaluation today patient appears to be doing well with regard to her legs the left leg is still healed the right leg is doing much better. I think we are on the right track here. With that being said unfortunately she continues to have significant issues with some other problems and she actually tells me on Saturday she was having lunch and went inside to sit down when she tells me she had all of a sudden a sharp sensation that went from her hand through her arm into her leg and her neck and she tells me that she could not see anything for the next hour it was completely black. Slowly after this her vision started to return and has been normal since. It sounds to me as if she may have had a TIA or mini stroke at this point. Nonetheless I think that she needs to have this checked out ASAP. Her primary care provider is Dr. Clide Deutscher at [facility]. We will get a try to get in touch with him. 03-10-2022 upon evaluation today patient's wound actually showed signs of  good improvement. She seems to be making excellent progress here. I do not see any evidence of active infection locally or systemically which is great news. No fevers, chills, nausea, vomiting, or diarrhea. She was in the hospital since have seen her last she ended up finding out that she was having syncopal episodes. That is what was going on with the blacking out and losing vision. Subsequently they adjusted some of her medication she seems to be doing better. 03-18-2022 upon evaluation today patient's wound is actually showing signs of good granulation and epithelization at this point. Fortunately I do not see any signs of active infection locally or systemically which is great news. 03-25-2022 any evidence of active infection locally or systemically which is great news. Any evidence of active infection which is great news and overall the wound is looking better. Upon evaluation today patient appears to be doing well with regard to her leg ulcer. She has been tolerating the dressing changes without complication. Fortunately I do not see 7/25; the patient's wound is on the right lateral lower extremity using silver alginate 3 layer compression. She has chronic venous insufficiency and lymphedema. We are using a juxta lite on the left leg which does not have any open wounds but she is leaving this on all week it seems that there just is not a way to get this changed at home 04-08-2022 upon evaluation today patient appears to be doing well currently in regard to her wound. She has been tolerating the dressing changes without complication. Fortunately I do not see any evidence of active infection locally or systemically which is great news. No fever or chills noted her left leg has reopened at this point. 04-15-2022 upon evaluation patient's wounds actually are showing signs of improvement which is great news. Little by little I do believe her making progress here. In regard to the right leg this is looking  better though the measurement does not speak to it there is a lot of new skin growth. On the left leg this is significantly smaller compared to last week most of  the blistered area has completely closed. 04-22-2022 upon evaluation today patient appears to be doing excellent in regard to her wounds both are showing signs of improvement. Her knees and above are still extremely swollen the legs were rewrapped them are down significantly. With that being said I do not think that we will get a be able to keep them that way once were not rapid noted that she really has no way to be able to put juxta lites on and off and nobody to help her with that. That is the biggest concern that we have here at this point. Fortunately I do not think that there is any evidence of infection which is great news. 04-29-2022 upon evaluation today patient appears to be doing well with regard to her wounds. She has been tolerating the dressing changes without complication. Fortunately there does not appear to be any evidence of infection locally or systemically which is great news and overall I am extremely pleased in that regard. Left leg is healed the right leg is very close we discussed today the possibility of her granddaughter helping with putting on and off compression wraps for her. 05-06-2022 upon evaluation today patient appears to be doing well currently in regard to her left leg which is showing signs of being completely healed. With regard to her right leg she still has an open area though this is doing much better there is a lot less drainage than what we previously noted. Fortunately I see no evidence of active infection locally or systemically at this time which is great news. 05-20-2022 upon evaluation today patient appears to be doing excellent in regard to her leg. The left leg is still healed the right leg is much better. Fortunately I see no signs of infection locally or systemically at this time which is great  news. No fevers, chills, nausea, vomiting, or diarrhea. 05-27-2022 upon evaluation today patient appears to be doing excellent in regard to her wound this is actually measuring smaller and looking much better. Fortunately I see no signs of active infection locally or systemically at this time. 06-03-2022 upon evaluation patient's wound actually is measuring smaller and looking much better. She still has a lot of discomfort but fortunately nothing to significant at this point which is great news. No fevers, chills, nausea, vomiting, or diarrhea. I think we are making some pretty good progress here. 06-10-2022 upon evaluation today patient appears to be doing well currently in regard to her wound which is actually measuring somewhat smaller. This is actually 2 separate wounds that are clustered and this is making it appear little bit larger than what it is but nonetheless we are seeing some definite improvement. Fortunately I do not see any signs of active infection at this time. No fevers, chills, nausea, vomiting, or diarrhea. 06-17-2022 upon evaluation today patient appears to be doing well currently in regard to her wound although she was in the hospital since she was last here. That was from 06-12-2022 through 06-16-2022 which was yesterday. This was due to a CHF exacerbation. With that being said the patient has noted in her record review acute on chronic congestive heart failure with hypertensive emergency noted. She also has stage IIIa chronic kidney disease noted. With that being said I do think her wound is much better because I got a lot of fluid off to try to diurese her to a degree but her kidneys would not hold for that they put her on furosemide 40 mg and that  is helping to get the fluid off she is much better but still has a lot of fluid buildup. 06-24-2022 upon evaluation today patient appears to be doing well currently in regard to her wound is not significantly smaller but it is improved  overall. Fortunately I do not see any signs of active infection locally nor systemically at this time. 07-01-2022 upon evaluation today patient appears to be doing okay in regard to her wound I do not believe the silver alginate is helping quite as much as I would like to see. I would actually recommend we switch back over to the [facility] at this point which I think may do better. Fortunately I do not see any signs of infection which is good news were going to switch to Tubigrip on the left leg on the right leg we will get a continue with the compression wrap. 07-08-2022 upon evaluation today patient unfortunately is not doing nearly as well in regard to her left leg. We had to put her back into a 4-layer compression wrap today due to a significant blister that appeared. I do believe that we need to try to get this to reabsorb we will see how things do over the next week. With that being said the right leg is doing well the 4-layer compression wrap and switch to the [facility] seems to be very beneficial I am pleased with what we are seeing in that regard. 11/7; patient's wounds are on the right posterior lateral leg actually measures smaller and look healthy we have been using Hydrofera Blue under 3 layer compression. Last week she had a large blister that was left intact covered with alginate but the leg was wrapped in 3 layer. Today the left leg had unroofed but the skin still looks reasonably healthy. Nevertheless she is still going to require the left leg to be wrapped 07-22-2022 upon evaluation today patient unfortunately is extremely swollen in regard to her bilateral lower extremities despite the fact that she is currently being compression wrap a 4-layer compression wrap bilaterally. This is unfortunate and the fact that I believe this is not just a lower extremity swelling issue but to be honest this is a much more global issue with edema in regard to her congestive heart failure.  Her BNP has been up in the past month and I am not sure where it is as it has been rechecked she sees the heart failure clinic on Thursday in 2 days. With that being said she does tell me that she is having trouble laying down she tells me that she is having trouble walking as well. With all that being said I advised that she probably needs to go to the hospital for further evaluation. 07-29-2022 upon evaluation today patient appears to be doing well currently in regard to her wound on the right leg I am very pleased the left leg is also doing better though it is weeping quite a bit. I think we need to do something to Help calm this down a bit. She is in agreement with that plan. 08-05-2022 upon evaluation today patient's wounds are showing signs of improvement albeit slowly. Fortunately I do not see any evidence of infection locally nor systemically at this time which is great news. No fevers, chills, nausea, vomiting, or diarrhea. [name], [name] ([id number]) [id number] Page 12 of 14 08-12-2022 upon evaluation today patient actually appears to be doing well currently in regard to her wounds which is showing signs of improvement. Fortunately I  do not see any signs of active infection locally nor systemically at this time which is great news. No fevers, chills, nausea, vomiting, or diarrhea. 08-19-2022 upon evaluation today patient appears to be doing decently well in regard to her wounds. She has been tolerating the dressing changes without complication. Fortunately there does not appear to be any signs of active infection locally nor systemically at this time which is great news. No fevers, chills, nausea, vomiting, or diarrhea. Objective Constitutional Well-nourished and well-hydrated in no acute distress. Vitals Time Taken: 2:45 PM, Height: 69 in, Weight: 244 lbs, BMI: 36, Temperature: 98.4 F, Pulse: 70 bpm, Respiratory Rate: 16 breaths/min, Blood Pressure: 110/58  mmHg. Respiratory normal breathing without difficulty. Psychiatric this patient is able to make decisions and demonstrates good insight into disease process. Alert and Oriented x 3. pleasant and cooperative. General Notes: Upon inspection patient's wound bed actually showed signs of good granulation and epithelization at this point. Fortunately I see no evidence of active infection locally nor systemically which is great news and overall I am extremely pleased with where we stand today. Integumentary (Hair, Skin) Wound #4 status is Open. Original cause of wound was Gradually Appeared. The date acquired was: 10/27/2021. The wound has been in treatment 42 weeks. The wound is located on the Right,Lateral Lower Leg. The wound measures 2cm length x 2cm width x 0.1cm depth; 3.142cm^2 area and 0.314cm^3 volume. There is Fat Layer (Subcutaneous Tissue) exposed. There is no tunneling or undermining noted. There is a medium amount of serosanguineous drainage noted. There is medium (34-66%) pink granulation within the wound bed. There is a medium (34-66%) amount of necrotic tissue within the wound bed including Adherent Slough. Wound #6 status is Open. Original cause of wound was Blister. The date acquired was: 07/09/2022. The wound has been in treatment 4 weeks. The wound is located on the Left,Medial Lower Leg. The wound measures 7.3cm length x 6cm width x 0.1cm depth; 34.4cm^2 area and 3.44cm^3 volume. There is no tunneling or undermining noted. There is a medium amount of serosanguineous drainage noted. There is no granulation within the wound bed. There is no necrotic tissue within the wound bed. 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 There was a Three Layer Compression Therapy Procedure by Massie Kluver. Post procedure Diagnosis Wound #4: Same as Pre-Procedure Wound #6 Pre-procedure diagnosis of Wound #6 is a Lymphedema located on the Left,Medial Lower Leg . There was a Three Layer Compression Therapy Procedure by Massie Kluver. Post procedure Diagnosis Wound #6: Same as Pre-Procedure Plan Follow-up Appointments: Return Appointment in 1 week. Nurse Visit as needed [name], [name] ([id number]) [id number] Page 13 of 14 Bathing/ Shower/ Hygiene: May shower with wound dressing protected with water repellent cover or cast protector. No tub bath. Anesthetic (Use 'Patient Medications' Section for Anesthetic Order Entry): Lidocaine applied to wound bed Edema Control - Lymphedema / Segmental Compressive Device / Other: Optional: One layer of unna paste to top of compression wrap (to act as an anchor). Elevate, Exercise Daily and Avoid Standing for Long Periods of Time. Elevate legs to the level of the heart and pump ankles as often as possible Elevate leg(s) parallel to the floor when sitting. Compression Pump: Use compression pump  on left lower extremity for 60 minutes, twice daily. - You may pump over wraps Compression Pump: Use compression pump on right lower extremity for 60 minutes, twice daily. - You may pump over wraps WOUND #4: - Lower Leg Wound Laterality: Right, Lateral Cleanser: Soap and Water 1 x Per Week/30 Days Discharge Instructions: Gently cleanse wound with antibacterial soap, rinse and pat dry prior to dressing wounds Cleanser: Wound Cleanser 1 x Per Week/30 Days Discharge Instructions: Wash your hands with soap and water. Remove old dressing, discard into plastic bag and place into trash. Cleanse the wound with Wound Cleanser prior to applying a clean dressing using gauze sponges, not tissues or cotton balls. Do not  scrub or use excessive force. Pat dry using gauze sponges, not tissue or cotton balls. Prim Dressing: Silvercel Small 2x2 (in/in) 1 x Per Week/30 Days ary Discharge Instructions: Apply Silvercel Small 2x2 (in/in) as instructed Com pression Wrap: 3-LAYER WRAP - Profore Lite LF 3 Multilayer Compression Bandaging System 1 x Per Week/30 Days Discharge Instructions: Apply 3 multi-layer wrap as prescribed. WOUND #6: - Lower Leg Wound Laterality: Left, Medial Cleanser: Soap and Water 1 x Per Week/30 Days Discharge Instructions: Gently cleanse wound with antibacterial soap, rinse and pat dry prior to dressing wounds Cleanser: Wound Cleanser 1 x Per Week/30 Days Discharge Instructions: Wash your hands with soap and water. Remove old dressing, discard into plastic bag and place into trash. Cleanse the wound with Wound Cleanser prior to applying a clean dressing using gauze sponges, not tissues or cotton balls. Do not scrub or use excessive force. Pat dry using gauze sponges, not tissue or cotton balls. Prim Dressing: Silvercel 4 1/4x 4 1/4 (in/in) 1 x Per Week/30 Days ary Discharge Instructions: Apply Silvercel 4 1/4x 4 1/4 (in/in) as instructed Secondary Dressing: ABD Pad 5x9 (in/in) 1 x Per Week/30 Days Discharge Instructions: Cover with ABD pad Com pression Wrap: 3-LAYER WRAP - Profore Lite LF 3 Multilayer Compression Bandaging System 1 x Per Week/30 Days Discharge Instructions: Apply 3 multi-layer wrap as prescribed. 1. I am going to suggest that we have the patient continue to monitor for any signs of infection or worsening. In general I believe that we are making overall good progress and I am extremely pleased. With that being said this is still very slow progress and again I do believe this can take some time before we get her where we want to be completely. 2. I am also can recommend we continue with the silver alginate for the left leg followed by ABD pad to cover 3 layer compression  wrap. 3. We will continue with the [facility] and the 3 layer compression wrap on the right leg. We will see patient back for reevaluation in 1 week here in the clinic. If anything worsens or changes patient will contact our office for additional recommendations. Electronic Signature(s) Signed: 08/19/2022 3:23:01 PM By: Worthy Keeler PA-C Entered By: Worthy Keeler on 08/19/2022 15:23:00 -------------------------------------------------------------------------------- SuperBill Details Patient Name: Date of Service: [name] 08/19/2022 Medical Record Number: [id number] Patient Account Number: [id number] Date of Birth/Sex: Treating RN: Aug 16, 1946 (76 y.o. [name] Primary Care Provider: [name] Other Clinician: Massie Kluver Referring Provider: Treating Provider/Extender: Mayer Masker, Ngwe Weeks in Treatment: 52 Diagnosis Coding ICD-10 Codes Code Description I89.0 Lymphedema, not elsewhere classified I87.332 Chronic venous hypertension (idiopathic) with ulcer and inflammation of left lower extremity KEISHLA, OYER ([id number]) [id number] Page 14 of 14 L97.812 Non-pressure chronic ulcer of  other part of right lower leg with fat layer exposed Facility Procedures : CPT4: Code [id number] Description: 81 BILATERAL: Application of multi-layer venous compression system; leg (below knee), including ankle and t. ICD-10 Diagnosis Description I89.0 Lymphedema, not elsewhere classified Modifier: Quantity: 1 Physician Procedures : CPT4 Code Description Modifier [id number] - WC PHYS LEVEL 3 - EST PT ICD-10 Diagnosis Description I89.0 Lymphedema, not elsewhere classified I87.332 Chronic venous hypertension (idiopathic) with ulcer and inflammation of left lower extremity L97.812  Non-pressure chronic ulcer of other part of right lower leg with fat layer exposed Quantity: 1 Electronic Signature(s) Signed: 08/19/2022  3:26:36 PM By: Worthy Keeler PA-C Entered By: Worthy Keeler on 08/19/2022 15:26:35

## 2022-08-20 ENCOUNTER — Other Ambulatory Visit: Payer: Self-pay | Admitting: Cardiovascular Disease

## 2022-08-20 ENCOUNTER — Telehealth: Payer: Self-pay | Admitting: Cardiovascular Disease

## 2022-08-20 NOTE — Telephone Encounter (Signed)
LVM to schedule

## 2022-08-20 NOTE — Telephone Encounter (Signed)
Please contact pt for future appointment. Pt overdue for f/u. Pt needing refills. 

## 2022-08-20 NOTE — Telephone Encounter (Signed)
Attempt #2 to schedule fu appt. LVM to schedule, please schedule

## 2022-08-21 ENCOUNTER — Ambulatory Visit: Payer: Medicare Other | Admitting: Podiatry

## 2022-08-21 ENCOUNTER — Encounter: Payer: Self-pay | Admitting: Podiatry

## 2022-08-21 DIAGNOSIS — M79674 Pain in right toe(s): Secondary | ICD-10-CM

## 2022-08-21 DIAGNOSIS — I739 Peripheral vascular disease, unspecified: Secondary | ICD-10-CM | POA: Diagnosis not present

## 2022-08-21 DIAGNOSIS — I890 Lymphedema, not elsewhere classified: Secondary | ICD-10-CM

## 2022-08-21 DIAGNOSIS — B351 Tinea unguium: Secondary | ICD-10-CM

## 2022-08-21 DIAGNOSIS — I872 Venous insufficiency (chronic) (peripheral): Secondary | ICD-10-CM

## 2022-08-21 DIAGNOSIS — M79675 Pain in left toe(s): Secondary | ICD-10-CM

## 2022-08-21 DIAGNOSIS — N1831 Chronic kidney disease, stage 3a: Secondary | ICD-10-CM | POA: Diagnosis not present

## 2022-08-21 NOTE — Telephone Encounter (Signed)
Refused refills , patient needs appointment

## 2022-08-21 NOTE — Progress Notes (Signed)
This patient returns to my office for at risk foot care.  This patient requires this care by a professional since this patient will be at risk due to having  PAD, CKD,lymphedema and venous stasis.  This patient is unable to cut nails herself since the patient cannot reach her nails.These nails are painful walking and wearing shoes.  She presents to the office in a wheelchair and wearing unna boots on both legs.  This patient presents for at risk foot care today.  General Appearance  Alert, conversant and in no acute stress.  Vascular  Dorsalis pedis and posterior tibial  pulses are palpable  deferred due to unna boots. Capillary return is within normal limits  bilaterally. Temperature is within normal limits  bilaterally.  Neurologic  Senn-Weinstein monofilament wire test within normal limits  bilaterally. Muscle power within normal limits bilaterally.  Nails Thick disfigured discolored nails with subungual debris  from hallux to fifth toes bilaterally. No evidence of bacterial infection or drainage bilaterally.  Orthopedic  No limitations of motion  feet .  No crepitus or effusions noted.  No bony pathology or digital deformities noted.  Skin  normotropic skin with no porokeratosis noted bilaterally.  No signs of infections or ulcers noted.     Onychomycosis  Pain in right toes  Pain in left toes  Consent was obtained for treatment procedures.   Mechanical debridement of nails 1-5  bilaterally performed with a nail nipper.  Filed with dremel without incident.    Return office visit   3 months                   Told patient to return for periodic foot care and evaluation due to potential at risk complications.   Gardiner Barefoot DPM

## 2022-08-22 ENCOUNTER — Encounter: Admission: RE | Payer: Self-pay | Source: Ambulatory Visit

## 2022-08-22 ENCOUNTER — Ambulatory Visit
Admission: RE | Admit: 2022-08-22 | Discharge: 2022-08-22 | Disposition: A | Discharge status: Home / Self Care | Payer: Medicare Other | Source: Ambulatory Visit | Attending: Oncology | Admitting: Oncology

## 2022-08-22 ENCOUNTER — Inpatient Hospital Stay: Payer: Medicare Other

## 2022-08-22 ENCOUNTER — Ambulatory Visit: Admission: RE | Admit: 2022-08-22 | Payer: Medicare Other | Source: Ambulatory Visit

## 2022-08-22 ENCOUNTER — Observation Stay
Admission: EM | Admit: 2022-08-22 | Discharge: 2022-08-23 | Disposition: A | Discharge status: Home / Self Care | Payer: Medicare Other | Attending: Internal Medicine | Admitting: Internal Medicine

## 2022-08-22 ENCOUNTER — Other Ambulatory Visit: Payer: Self-pay

## 2022-08-22 ENCOUNTER — Emergency Department: Payer: Medicare Other

## 2022-08-22 ENCOUNTER — Encounter: Payer: Self-pay | Admitting: Internal Medicine

## 2022-08-22 DIAGNOSIS — R8281 Pyuria: Secondary | ICD-10-CM

## 2022-08-22 DIAGNOSIS — Z1152 Encounter for screening for COVID-19: Secondary | ICD-10-CM | POA: Diagnosis not present

## 2022-08-22 DIAGNOSIS — Z87891 Personal history of nicotine dependence: Secondary | ICD-10-CM | POA: Diagnosis not present

## 2022-08-22 DIAGNOSIS — Z9181 History of falling: Secondary | ICD-10-CM | POA: Insufficient documentation

## 2022-08-22 DIAGNOSIS — N179 Acute kidney failure, unspecified: Secondary | ICD-10-CM | POA: Diagnosis not present

## 2022-08-22 DIAGNOSIS — R001 Bradycardia, unspecified: Secondary | ICD-10-CM | POA: Insufficient documentation

## 2022-08-22 DIAGNOSIS — E042 Nontoxic multinodular goiter: Secondary | ICD-10-CM

## 2022-08-22 DIAGNOSIS — I5030 Unspecified diastolic (congestive) heart failure: Secondary | ICD-10-CM | POA: Diagnosis not present

## 2022-08-22 DIAGNOSIS — M6281 Muscle weakness (generalized): Secondary | ICD-10-CM | POA: Diagnosis not present

## 2022-08-22 DIAGNOSIS — N1831 Chronic kidney disease, stage 3a: Secondary | ICD-10-CM | POA: Diagnosis not present

## 2022-08-22 DIAGNOSIS — R262 Difficulty in walking, not elsewhere classified: Secondary | ICD-10-CM | POA: Diagnosis not present

## 2022-08-22 DIAGNOSIS — C50911 Malignant neoplasm of unspecified site of right female breast: Secondary | ICD-10-CM | POA: Diagnosis present

## 2022-08-22 DIAGNOSIS — R0602 Shortness of breath: Secondary | ICD-10-CM | POA: Diagnosis not present

## 2022-08-22 DIAGNOSIS — Z79899 Other long term (current) drug therapy: Secondary | ICD-10-CM | POA: Insufficient documentation

## 2022-08-22 DIAGNOSIS — I130 Hypertensive heart and chronic kidney disease with heart failure and stage 1 through stage 4 chronic kidney disease, or unspecified chronic kidney disease: Secondary | ICD-10-CM | POA: Diagnosis not present

## 2022-08-22 DIAGNOSIS — R55 Syncope and collapse: Secondary | ICD-10-CM | POA: Diagnosis present

## 2022-08-22 DIAGNOSIS — R531 Weakness: Secondary | ICD-10-CM

## 2022-08-22 DIAGNOSIS — G4733 Obstructive sleep apnea (adult) (pediatric): Secondary | ICD-10-CM | POA: Diagnosis present

## 2022-08-22 DIAGNOSIS — Z853 Personal history of malignant neoplasm of breast: Secondary | ICD-10-CM | POA: Insufficient documentation

## 2022-08-22 DIAGNOSIS — I10 Essential (primary) hypertension: Secondary | ICD-10-CM | POA: Diagnosis present

## 2022-08-22 DIAGNOSIS — Z9889 Other specified postprocedural states: Secondary | ICD-10-CM | POA: Insufficient documentation

## 2022-08-22 DIAGNOSIS — G459 Transient cerebral ischemic attack, unspecified: Secondary | ICD-10-CM | POA: Diagnosis present

## 2022-08-22 DIAGNOSIS — Z7984 Long term (current) use of oral hypoglycemic drugs: Secondary | ICD-10-CM | POA: Diagnosis not present

## 2022-08-22 DIAGNOSIS — K5900 Constipation, unspecified: Secondary | ICD-10-CM | POA: Insufficient documentation

## 2022-08-22 HISTORY — DX: Pyuria: R82.81

## 2022-08-22 LAB — CBC WITH DIFFERENTIAL/PLATELET
Abs Immature Granulocytes: 0.01 10*3/uL (ref 0.00–0.07)
Basophils Absolute: 0.1 10*3/uL (ref 0.0–0.1)
Basophils Relative: 1 %
Eosinophils Absolute: 0.1 10*3/uL (ref 0.0–0.5)
Eosinophils Relative: 2 %
HCT: 32.5 % — ABNORMAL LOW (ref 36.0–46.0)
Hemoglobin: 10 g/dL — ABNORMAL LOW (ref 12.0–15.0)
Immature Granulocytes: 0 %
Lymphocytes Relative: 27 %
Lymphs Abs: 1.3 10*3/uL (ref 0.7–4.0)
MCH: 26.7 pg (ref 26.0–34.0)
MCHC: 30.8 g/dL (ref 30.0–36.0)
MCV: 86.9 fL (ref 80.0–100.0)
Monocytes Absolute: 0.7 10*3/uL (ref 0.1–1.0)
Monocytes Relative: 15 %
Neutro Abs: 2.7 10*3/uL (ref 1.7–7.7)
Neutrophils Relative %: 55 %
Platelets: 350 10*3/uL (ref 150–400)
RBC: 3.74 MIL/uL — ABNORMAL LOW (ref 3.87–5.11)
RDW: 14.1 % (ref 11.5–15.5)
WBC: 4.9 10*3/uL (ref 4.0–10.5)
nRBC: 0 % (ref 0.0–0.2)

## 2022-08-22 LAB — URINALYSIS, COMPLETE (UACMP) WITH MICROSCOPIC
Bilirubin Urine: NEGATIVE
Glucose, UA: 50 mg/dL — AB
Hgb urine dipstick: NEGATIVE
Ketones, ur: NEGATIVE mg/dL
Nitrite: NEGATIVE
Protein, ur: NEGATIVE mg/dL
Specific Gravity, Urine: 1.011 (ref 1.005–1.030)
pH: 5 (ref 5.0–8.0)

## 2022-08-22 LAB — COMPREHENSIVE METABOLIC PANEL
ALT: 13 U/L (ref 0–44)
AST: 15 U/L (ref 15–41)
Albumin: 3.4 g/dL — ABNORMAL LOW (ref 3.5–5.0)
Alkaline Phosphatase: 60 U/L (ref 38–126)
Anion gap: 9 (ref 5–15)
BUN: 41 mg/dL — ABNORMAL HIGH (ref 8–23)
CO2: 30 mmol/L (ref 22–32)
Calcium: 9.2 mg/dL (ref 8.9–10.3)
Chloride: 100 mmol/L (ref 98–111)
Creatinine, Ser: 1.83 mg/dL — ABNORMAL HIGH (ref 0.44–1.00)
GFR, Estimated: 28 mL/min — ABNORMAL LOW (ref >60)
Glucose, Bld: 91 mg/dL (ref 70–99)
Potassium: 4.7 mmol/L (ref 3.5–5.1)
Sodium: 139 mmol/L (ref 135–145)
Total Bilirubin: 0.6 mg/dL (ref 0.3–1.2)
Total Protein: 8 g/dL (ref 6.5–8.1)

## 2022-08-22 LAB — BLOOD GAS, VENOUS
Acid-Base Excess: 8.1 mmol/L — ABNORMAL HIGH (ref 0.0–2.0)
Bicarbonate: 35.1 mmol/L — ABNORMAL HIGH (ref 20.0–28.0)
O2 Saturation: 52.2 %
Patient temperature: 37
pCO2, Ven: 58 mmHg (ref 44–60)
pH, Ven: 7.39 (ref 7.25–7.43)
pO2, Ven: 40 mmHg (ref 32–45)

## 2022-08-22 LAB — URINE DRUG SCREEN, QUALITATIVE (ARMC ONLY)
Amphetamines, Ur Screen: NOT DETECTED
Barbiturates, Ur Screen: NOT DETECTED
Benzodiazepine, Ur Scrn: NOT DETECTED
Cannabinoid 50 Ng, Ur ~~LOC~~: NOT DETECTED
Cocaine Metabolite,Ur ~~LOC~~: NOT DETECTED
MDMA (Ecstasy)Ur Screen: NOT DETECTED
Methadone Scn, Ur: NOT DETECTED
Opiate, Ur Screen: NOT DETECTED
Phencyclidine (PCP) Ur S: NOT DETECTED
Tricyclic, Ur Screen: NOT DETECTED

## 2022-08-22 LAB — PROCALCITONIN: Procalcitonin: <0.1 ng/mL

## 2022-08-22 LAB — RESP PANEL BY RT-PCR (RSV, FLU A&B, COVID)  RVPGX2
Influenza A by PCR: NEGATIVE
Influenza B by PCR: NEGATIVE
Resp Syncytial Virus by PCR: NEGATIVE
SARS Coronavirus 2 by RT PCR: NEGATIVE

## 2022-08-22 LAB — PHOSPHORUS: Phosphorus: 4 mg/dL (ref 2.5–4.6)

## 2022-08-22 LAB — T4, FREE: Free T4: 1.37 ng/dL — ABNORMAL HIGH (ref 0.61–1.12)

## 2022-08-22 LAB — TROPONIN I (HIGH SENSITIVITY)
Troponin I (High Sensitivity): 11 ng/L (ref <18)
Troponin I (High Sensitivity): 11 ng/L (ref <18)

## 2022-08-22 LAB — TSH: TSH: 0.265 u[IU]/mL — ABNORMAL LOW (ref 0.350–4.500)

## 2022-08-22 LAB — CBG MONITORING, ED: Glucose-Capillary: 85 mg/dL (ref 70–99)

## 2022-08-22 LAB — MAGNESIUM: Magnesium: 2.5 mg/dL — ABNORMAL HIGH (ref 1.7–2.4)

## 2022-08-22 SURGERY — COLONOSCOPY WITH PROPOFOL
Anesthesia: General

## 2022-08-22 MED ORDER — DOCUSATE SODIUM 100 MG PO CAPS: 100.0000 mg | ORAL_CAPSULE | Freq: Two times a day (BID) | ORAL | Status: DC | PRN

## 2022-08-22 MED ORDER — SODIUM CHLORIDE 0.9% FLUSH
3.0000 mL | Freq: Two times a day (BID) | INTRAVENOUS | Status: DC
  Administered 2022-08-22 – 2022-08-23 (×2): 3 mL via INTRAVENOUS

## 2022-08-22 MED ORDER — ASPIRIN 81 MG PO TBEC
81.0000 mg | DELAYED_RELEASE_TABLET | Freq: Every day | ORAL | Status: DC
  Administered 2022-08-22 – 2022-08-23 (×2): 81 mg via ORAL
  Filled 2022-08-22 (×2): qty 1

## 2022-08-22 MED ORDER — FUROSEMIDE 10 MG/ML IJ SOLN
20.0000 mg | Freq: Once | INTRAMUSCULAR | Status: AC
  Administered 2022-08-22: 20 mg via INTRAVENOUS
  Filled 2022-08-22: qty 4

## 2022-08-22 MED ORDER — ACETAMINOPHEN 325 MG PO TABS: 650.0000 mg | ORAL_TABLET | Freq: Four times a day (QID) | ORAL | Status: DC | PRN

## 2022-08-22 MED ORDER — PANTOPRAZOLE SODIUM 40 MG PO TBEC
80.0000 mg | DELAYED_RELEASE_TABLET | Freq: Every day | ORAL | Status: DC
  Administered 2022-08-23: 80 mg via ORAL
  Filled 2022-08-22: qty 2

## 2022-08-22 MED ORDER — HEPARIN SODIUM (PORCINE) 5000 UNIT/ML IJ SOLN
5000.0000 [IU] | Freq: Three times a day (TID) | INTRAMUSCULAR | Status: DC
  Administered 2022-08-22 – 2022-08-23 (×2): 5000 [IU] via SUBCUTANEOUS
  Filled 2022-08-22 (×2): qty 1

## 2022-08-22 MED ORDER — CLONIDINE HCL 0.1 MG PO TABS: 0.1000 mg | ORAL_TABLET | Freq: Two times a day (BID) | ORAL | Status: DC | PRN

## 2022-08-22 MED ORDER — SENNOSIDES-DOCUSATE SODIUM 8.6-50 MG PO TABS
1.0000 | ORAL_TABLET | Freq: Two times a day (BID) | ORAL | Status: DC
  Administered 2022-08-22 – 2022-08-23 (×2): 1 via ORAL
  Filled 2022-08-22 (×2): qty 1

## 2022-08-22 MED ORDER — ONDANSETRON HCL 4 MG PO TABS: 4.0000 mg | ORAL_TABLET | Freq: Four times a day (QID) | ORAL | Status: DC | PRN

## 2022-08-22 MED ORDER — HYDRALAZINE HCL 20 MG/ML IJ SOLN: 5.0000 mg | Freq: Three times a day (TID) | INTRAMUSCULAR | Status: DC | PRN

## 2022-08-22 MED ORDER — ONDANSETRON HCL 4 MG/2ML IJ SOLN: 4.0000 mg | Freq: Four times a day (QID) | INTRAMUSCULAR | Status: DC | PRN

## 2022-08-22 MED ORDER — ISOSORBIDE MONONITRATE ER 60 MG PO TB24
60.0000 mg | ORAL_TABLET | Freq: Two times a day (BID) | ORAL | Status: DC
  Administered 2022-08-22 – 2022-08-23 (×2): 60 mg via ORAL
  Filled 2022-08-22 (×2): qty 1

## 2022-08-22 MED ORDER — ANASTROZOLE 1 MG PO TABS
1.0000 mg | ORAL_TABLET | Freq: Every day | ORAL | Status: DC
  Administered 2022-08-22 – 2022-08-23 (×2): 1 mg via ORAL
  Filled 2022-08-22 (×3): qty 1

## 2022-08-22 MED ORDER — SENNOSIDES-DOCUSATE SODIUM 8.6-50 MG PO TABS: 1.0000 | ORAL_TABLET | Freq: Every evening | ORAL | Status: DC | PRN

## 2022-08-22 MED ORDER — OXYBUTYNIN CHLORIDE 5 MG PO TABS
5.0000 mg | ORAL_TABLET | Freq: Two times a day (BID) | ORAL | Status: DC
  Administered 2022-08-22 – 2022-08-23 (×2): 5 mg via ORAL
  Filled 2022-08-22 (×2): qty 1

## 2022-08-22 MED ORDER — ACETAMINOPHEN 650 MG RE SUPP: 650.0000 mg | Freq: Four times a day (QID) | RECTAL | Status: DC | PRN

## 2022-08-22 NOTE — Assessment & Plan Note (Signed)
-   Resumed home isosorbide mononitrate 60 mg p.o. twice daily - Losartan not resumed on admission due to AKI - Hydralazine 5 mg every 8 hours as needed for SBP given 175

## 2022-08-22 NOTE — ED Triage Notes (Signed)
Pt came from a RRT in ultrasound where pt was getting ready to have a thyroid biopsy and pt became confused, pt's HR & O2 started to decrease confirmed by PA. Drake 2L applied in [place].   Upon arrival to ED pt was alert, oriented, NAD.

## 2022-08-22 NOTE — ED Notes (Signed)
Daughter called asking for an update. Returned the call and provided same.

## 2022-08-22 NOTE — Assessment & Plan Note (Signed)
-   Fall and aspiration precautions

## 2022-08-22 NOTE — Assessment & Plan Note (Signed)
-   This meets criteria for morbid obesity based on the presence of 1 or more chronic comorbidities. This complicates overall care and prognosis.

## 2022-08-22 NOTE — Progress Notes (Signed)
[name] is a 76 yo female who presented to [facility] ultrasound department today for an image-guided thyroid biopsy. Upon arrival to the room, the patient was alert, oriented, and talking with ease to myself and the ultrasound technician. The patient was attached to finger pulse oximetry and a BP cuff was applied. The patient was noted to be bradycardic with a heart rate of 35 on the monitor. SpO2 was 98% at that time. Manual palpation of radial pulse at this time found a heart rate of 40. When questioned regarding a history of bradycardia, the patient stated she has recently started "new heart medicine" for an "irregular heartbeat."   Following this discussion, the patient was noted on the monitor to have a heart rate of 128, which manual palpation confirmed. A 12-lead EKG was then ordered, but before leads could be placed on the patient, the patient became bradycardic again with heart rate of 36. At this point in time her SpO2 dropped to 81%, and the patient was noted to have a mental status change, with lethargy and confusion present. The rapid response team was immediately called, and the patient was started on 2L of O2 via nasal cannula. Oxygenation increased into the low 90s, and with the aid of the rapid response team, the patient was transported to the ED for evaluation. Upon arrival to the ED, a summary of the above events were conveyed to the patient's nurse and to Dr Starleen Blue.  No attempt was made on the patient's schedule thyroid biopsy, and no medications were administered during the patient's encounter with the ultrasound department. The patient's biopsy will need to be rescheduled.  [name], PA-C 08/22/2022 2:37 PM

## 2022-08-22 NOTE — ED Notes (Signed)
Pt requested food, verified with MD if that would be okay. MD approved. This RN delivered food to pt.

## 2022-08-22 NOTE — ED Provider Notes (Signed)
[facility] Provider Note    Event Date/Time   First MD Initiated Contact with Patient 08/22/22 1355     (approximate)   History   Near Syncope   HPI  [name] is a 76 y.o. female  with pmh breast cancer currently on Arimidex, diastolic CHF (G1 DD, EF 50 to 55% 03/04/2022), HTN, sleep apnea, chronic lower extremity wounds followed at the wound center, CKD 3a presents as a rapid response from radiolog.y due to bradycardia and altered mental status.  Patient was getting an FNA of her thyroid in the radiology department when apparently they were starting to ultrasound her thyroid for heart rate was noted to be in the 30s and then apparently jumped up to the 120s and patient was initially asymptomatic but then had a change in mental status became confused and her oxygen dropped.  She was made a rapid response and taken to the emergency department.  Patient does not really remember the episode she is denying complaints currently.  Tells me she does have episodes where when she sitting down she will to suddenly fall asleep but is denying any lightheadedness or dizziness.  I do see that patient has had episodes of near syncope in the past.  Patient is denying chest pain denies shortness of breath.     Past Medical History:  Diagnosis Date   Abnormality of gait    Acute respiratory failure with hypoxia ([place]) 06/13/2022   Breast cancer ([place]) 02/05/2005   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    Breast cancer ([place]) 2019   right breast   CHF (congestive heart failure) ([place])    March 28, 2017 echo: EF 50-55%; 1) echo 07/2011: EF 20-25%, mild concentric hypertrophy, diffuse HK, regional WMA cannot be excluded, grade 1 DD, mitral valve with mild regurg, LA mildly dilated). 2) EF 40-45%, mild AS,                     Chronic kidney disease    Complication of anesthesia 2006   pt states  "hard to wake up" after L breast surgery    Edema    GERD (gastroesophageal reflux disease)    Headache    Heart murmur    Hypertension    Hypokinesis    global, EF 35-45 % from Echo.   Malignant neoplasm of breast (female), unspecified site 06/14/2018   RIGHT T1b, N0; ER/PR positive, HER-2 negative.   Neuropathy    Obesity    Pain in joint, site unspecified    Personal history of chemotherapy 2006   left breast   Personal history of radiation therapy 2019   right breast   Personal history of radiation therapy 2006   left breast   Pneumonia    Sleep apnea    Tinea pedis    Unspecified hearing loss    bilateral    Patient Active Problem List   Diagnosis Date Noted   Stage 3a chronic kidney disease ([place]) 06/13/2022   Chest pain 06/13/2022   Hypertensive emergency 06/13/2022   Syncope 03/03/2022   Acute kidney injury superimposed on CKD ([place]) 03/03/2022   Weakness 03/03/2022   Ulcers of both lower extremities, with unspecified severity ([place]) 03/03/2022   Obesity (BMI 30-39.9) 03/03/2022   Anemia of chronic disease 03/03/2022   Lower limb ulcer, calf, left, limited to breakdown of skin ([place]) 12/11/2020   PAD (peripheral artery disease) ([place])  05/30/2020   Malignant neoplasm of right female breast ([place]) 05/13/2018   Venous insufficiency of both lower extremities 11/20/2017   Acute on chronic diastolic CHF (congestive heart failure) ([place]) 11/20/2017   Weight gain 11/20/2017   Lymphedema 04/19/2017   Benign essential HTN 04/19/2017   OSA (obstructive sleep apnea) 04/19/2017   Morbid obesity ([place]) 04/19/2017   TIA (transient ischemic attack) 03/28/2017   Urinary incontinence 54/49/2010   Chronic systolic CHF (congestive heart failure) ([place]) 04/20/2013   Essential hypertension 07/16/2011   CHF (congestive heart failure) ([place]) 07/15/2011   Shortness of breath 07/15/2011   Edema 07/15/2011   Breast cancer ([place]) 09/08/2004     Physical Exam  Triage Vital Signs: ED  Triage Vitals  Enc Vitals Group     BP 08/22/22 1358 (!) 130/57     Pulse Rate 08/22/22 1358 (!) 46     Resp 08/22/22 1358 15     Temp 08/22/22 1358 98.3 F (36.8 C)     Temp Source 08/22/22 1358 Oral     SpO2 --      Weight --      Height --      Head Circumference --      Peak Flow --      Pain Score 08/22/22 1409 0     Pain Loc --      Pain Edu? --      Excl. in [place]? --     Most recent vital signs: Vitals:   08/22/22 1358 08/22/22 1532  BP: (!) 130/57   Pulse: (!) 46   Resp: 15   Temp: 98.3 F (36.8 C)   SpO2:  95%     General: Awake, no distress.  CV:  Good peripheral perfusion.  Resp:  Normal effort.  Abd:  No distention.  Neuro:             Awake, Alert, Oriented x 3  Other:     ED Results / Procedures / Treatments  Labs (all labs ordered are listed, but only abnormal results are displayed) Labs Reviewed  COMPREHENSIVE METABOLIC PANEL - Abnormal; Notable for the following components:      Result Value   BUN 41 (*)    Creatinine, Ser 1.83 (*)    Albumin 3.4 (*)    GFR, Estimated 28 (*)    All other components within normal limits  CBC WITH DIFFERENTIAL/PLATELET - Abnormal; Notable for the following components:   RBC 3.74 (*)    Hemoglobin 10.0 (*)    HCT 32.5 (*)    All other components within normal limits  TSH - Abnormal; Notable for the following components:   TSH 0.265 (*)    All other components within normal limits  T4, FREE - Abnormal; Notable for the following components:   Free T4 1.37 (*)    All other components within normal limits  CBG MONITORING, ED  TROPONIN I (HIGH SENSITIVITY)     EKG  EKG reviewed interpreted myself shows sinus rhythm, with PACs normal axis normal intervals no acute ischemic changes   RADIOLOGY I reviewed and interpreted the CXR which does not show any acute cardiopulmonary process    PROCEDURES:  Critical Care performed: No  .1-3 Lead EKG Interpretation  Performed by: Rada Hay,  MD Authorized by: Rada Hay, MD     Interpretation: normal     ECG rate assessment: bradycardic     Rhythm: sinus bradycardia     Ectopy: none  Conduction: normal       MEDICATIONS ORDERED IN ED: Medications - No data to display   IMPRESSION / MDM / [place] / ED COURSE  I reviewed the triage vital signs and the nursing notes.                              Patient's presentation is most consistent with acute presentation with potential threat to life or bodily function.  Differential diagnosis includes, but is not limited to, symptomatic bradycardia, heart block, electrolyte abnormality, hypothyroidism  Patient is a 76 year old female with breast cancer, diastolic heart failure chronic lower extremity swelling who presents because of altered mental status and bradycardia.  PA from the radiology department came and talk to me she was about to get an biopsy of her thyroid when heart rate was noted to be in the 30s then jumped up to the 120s patient initially was asymptomatic but then was noted to be altered and hypoxic.  Somewhat unclear whether she syncopized but she did have decreased responsiveness.  Patient tells me that she does have episodes where she falls asleep and she is a poor historian so it is difficult for me to tell whether this is true syncope or not but patient denies ever falling or losing postural tone.  She is essentially asymptomatic right now.  Heart rate is in the 40s to 60s.  Initially we had difficulty obtaining a good pulse ox but when we do have a good [place] it is in the 90s and she is not requiring oxygen.  EKG shows sinus bradycardia with PACs and a prolonged PR interval.  Labs including a troponin and electrolytes are reassuring.  However given patient's change in mental status which could be near syncope in the setting of the bradycardia I do think it is best to admit her for observation telemetry.  I do see that she is on clonidine but is  not on any other AV nodal blocking agents.       FINAL CLINICAL IMPRESSION(S) / ED DIAGNOSES   Final diagnoses:  Bradycardia     Rx / DC Orders   ED Discharge Orders     None        Note:  This document was prepared using Dragon voice recognition software and may include unintentional dictation errors.   Rada Hay, MD 08/22/22 [phone]

## 2022-08-22 NOTE — Assessment & Plan Note (Signed)
-   Patient reports that she used to wear a CPAP machine however it has been more than 5 years since her CPAP machine no longer works - Patient likely require a new sleep study in order to qualify and or receive new fitting for CPAP machine - I discussed this with the patient and her son at bedside and they endorsed understanding and compliance - CPAP nightly ordered, patient states she would like to wear this at night

## 2022-08-22 NOTE — Assessment & Plan Note (Addendum)
-   On CKD 3A, query prerenal - Furosemide 20 mg IV one-time dose ordered - Strict I's and O's

## 2022-08-22 NOTE — Assessment & Plan Note (Signed)
-   She denies dysuria, hematuria, increased frequency or urgency - No indication for antibiotics at this time

## 2022-08-22 NOTE — Hospital Course (Addendum)
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  Patient did not remember the episode.  Initial vitals in the emergency department showed temperature of 98.3, respiration rate of 15, heart rate of 46, blood pressure 130/57, SpO2 of 100% on 2 L nasal cannula.  Serum sodium is 139, potassium 4.7, chloride of 100, bicarb 30, BUN of 41, serum creatinine of 1.89, GFR of 28, nonfasting blood glucose 91, WBC 4.9, hemoglobin 10, platelets of 350.  High sensitive troponin was 11.  TSH is 0.265.  T4 was 1.37.  Portable chest x-ray was read as cardiomegaly.  Low lung volumes with left basilar atelectasis or infiltrate.  ED treatment: None

## 2022-08-22 NOTE — H&P (Addendum)
History and Physical   [name] BJS:[id number] DOB: [birth year] DOA: 08/22/2022  PCP: Donnie Coffin, MD  Outpatient Specialists: Dr. Janese Banks Patient coming from: Hospital ultrasound unit  I have personally briefly reviewed patient's old medical records in [place].  Chief Concern: Near syncope  HPI: 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  Patient did not remember the episode.  Initial vitals in the emergency department showed temperature of 98.3, respiration rate of 15, heart rate of 46, blood pressure 130/57, SpO2 of 100% on 2 L nasal cannula.  Serum sodium is 139, potassium 4.7, chloride of 100, bicarb 30, BUN of 41, serum creatinine of 1.89, GFR of 28, nonfasting blood glucose 91, WBC 4.9, hemoglobin 10, platelets of 350.  High sensitive troponin was 11.  TSH is 0.265.  T4 was 1.37.  Portable chest x-ray was read as cardiomegaly.  Low lung volumes with left basilar atelectasis or infiltrate.  ED treatment: None --------------------------- At bedside patient was able to tell me her name, her age, the current calendar year and she knows she is in the hospital.  She reports that she was at ultrasound getting her thyroid biopsied when she felt small amount of shortness of breath.  She states that this ultrasound staff stated that her oxygen had declined.  She reports that she has been intermittent feelings of shortness of breath at home.  She denies chest pain, abdominal pain, dysuria, hematuria, diarrhea.  She reports that she has been feeling shortness of breath like this ever since she did not get her CPAP machine renewed and or get a new machine that works.  She endorses generalized  weakness with difficulty walking.  Social history: Patient lives at home with her daughter.  She denies tobacco, EtOH, recreational drug use.  ROS: Constitutional: no weight change, no fever ENT/Mouth: no sore throat, no rhinorrhea Eyes: no eye pain, no vision changes Cardiovascular: no chest pain, + dyspnea,  no edema, no palpitations Respiratory: no cough, no sputum, no wheezing Gastrointestinal: no nausea, no vomiting, no diarrhea, no constipation Genitourinary: no urinary incontinence, no dysuria, no hematuria Musculoskeletal: no arthralgias, no myalgias Skin: no skin lesions, no pruritus, Neuro: + weakness, no loss of consciousness, no syncope Psych: no anxiety, no depression, no decrease appetite Heme/Lymph: no bruising, no bleeding  ED Course: Discussed with emergency medicine provider, patient requiring hospitalization for chief concerns of near syncope.  Assessment/Plan  Principal Problem:   Near syncope Active Problems:   AKI (acute kidney injury) ([place])   Essential hypertension   TIA (transient ischemic attack)   OSA (obstructive sleep apnea)   Morbid obesity (HCC)   Malignant neoplasm of right female breast (HCC)   Weakness   Stage 3a chronic kidney disease (HCC)   Constipation   Pyuria   Assessment and Plan:  * Near syncope - Etiology workup in progress at this time, differential diagnosis includes heart failure, stroke, respiratory distress part/failure as patient is not able to wear her CPAP machine at night - CT of the head without contrast, complete echo - Patient may benefit from Holter monitor/cardiac event monitor prior to discharge if the workup is negative while inpatient - Check COVID/influenza A/influenza B/4 SP PCR, VBG for CO2 retention, magnesium, phosphorus, UA with microscopy, follow high  sensitive troponin - Fall and aspiration precaution  AKI (acute kidney injury) ([place]) - On CKD 3A, query prerenal - Furosemide 20 mg IV one-time dose  ordered - Strict I's and O's  Pyuria - She denies dysuria, hematuria, increased frequency or urgency - No indication for antibiotics at this time  Weakness - Fall and aspiration precautions  Morbid obesity ([place]) - This meets criteria for morbid obesity based on the presence of 1 or more chronic comorbidities. This complicates overall care and prognosis.   OSA (obstructive sleep apnea) - Patient reports that she used to wear a CPAP machine however it has been more than 5 years since her CPAP machine no longer works - Patient likely require a new sleep study in order to qualify and or receive new fitting for CPAP machine - I discussed this with the patient and her son at bedside and they endorsed understanding and compliance - CPAP nightly ordered, patient states she would like to wear this at night  Essential hypertension - Resumed home isosorbide mononitrate 60 mg p.o. twice daily - Losartan not resumed on admission due to AKI - Hydralazine 5 mg every 8 hours as needed for SBP given 175  Chart reviewed.   DVT prophylaxis: Heparin 5000 units subcutaneous every 8 hours Code Status: Full code Diet: Heart healthy Family Communication: Updated son at bedside with patient's permission Disposition Plan: Pending clinical course Consults called: TOC, PT, OT Admission status: Telemetry, cardiac  Past Medical History:  Diagnosis Date   Abnormality of gait    Acute respiratory failure with hypoxia ([place]) 06/13/2022   Breast cancer ([place]) 02/05/2005   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    Breast cancer ([place]) 2019   right breast   CHF (congestive heart failure) ([place])    March 28, 2017 echo: EF 50-55%; 1) echo 07/2011: EF 20-25%, mild concentric hypertrophy, diffuse HK, regional WMA cannot be excluded, grade 1 DD, mitral valve with mild regurg, LA mildly dilated). 2) EF 40-45%, mild AS,                      Chronic kidney disease    Complication of anesthesia 2006   pt states "hard to wake up" after L breast surgery    Edema    GERD (gastroesophageal reflux disease)    Headache    Heart murmur    Hypertension    Hypokinesis    global, EF 35-45 % from Echo.   Malignant neoplasm of breast (female), unspecified site 06/14/2018   RIGHT T1b, N0; ER/PR positive, HER-2 negative.   Neuropathy    Obesity    Pain in joint, site unspecified    Personal history of chemotherapy 2006   left breast   Personal history of radiation therapy 2019   right breast   Personal history of radiation therapy 2006   left breast   Pneumonia    Sleep apnea    Tinea pedis    Unspecified hearing loss    bilateral   Past Surgical History:  Procedure Laterality Date   BREAST BIOPSY Right 2019   11 oc-INVASIVE MAMMARY CARCINOMA, NO SPECIAL TYPE   BREAST BIOPSY Right 2019   1030 oc- neg   BREAST LUMPECTOMY  2006   left breast    CATARACT EXTRACTION     left eye   HERNIA REPAIR  05/25/2006   Incarcerated omentum in ventral hernia, Ventralight mesh, combined lap/ open  with omental resection.    PARTIAL MASTECTOMY WITH NEEDLE LOCALIZATION Right 06/14/2018   Procedure: PARTIAL MASTECTOMY WITH NEEDLE LOCALIZATION;  Surgeon: Robert Bellow, MD;  Location: [facility] ORS;  Service: General;  Laterality: Right;   PORT-A-CATH REMOVAL  05/25/2006   SENTINEL NODE BIOPSY Right 06/14/2018   Procedure: SENTINEL NODE BIOPSY;  Surgeon: Robert Bellow, MD;  Location: [facility] ORS;  Service: General;  Laterality: Right;   TUBAL LIGATION     Social History:  reports that she quit smoking about 22 years ago. Her smoking use included cigarettes. She has a 15.00 pack-year smoking history. She has never used smokeless tobacco. She reports that she does not currently use drugs after having used the following drugs: Marijuana. She reports that she does not drink alcohol.  No Known Allergies Family History  Problem Relation Age of  Onset   Hypertension Mother        deceased 57   Cataracts Mother    Breast cancer Other 27       maternal half-sister; deceased 74   Coronary artery disease Other    Family history: Family history reviewed and not pertinent  Prior to Admission medications   Medication Sig Start Date End Date Taking? Authorizing Provider  acetaminophen-codeine (TYLENOL #3) 300-30 MG tablet Take 1-2 tablets by mouth every 4 (four) hours as needed for moderate pain. 06/05/22   Felipa Furnace, DPM  anastrozole (ARIMIDEX) 1 MG tablet TAKE 1 TABLET BY MOUTH ONCE DAILY. MUST MAKE AN APPOINTMENT TO SEE DR. RAO. 08/14/22   Sindy Guadeloupe, MD  aspirin EC 81 MG tablet Take 81 mg by mouth daily.    [provider]  cloNIDine (CATAPRES) 0.1 MG tablet Take 1 tablet (0.1 mg total) by mouth 2 (two) times daily as needed (systolic BP > 161 or diastolic BP > 95). 06/15/22   Nicole Kindred A, DO  [facility] CALCIUM + D3 600-20 MG-MCG TABS TAKE 1 TABLET BY MOUTH TWICE A DAY 02/04/22   Sindy Guadeloupe, MD  diclofenac Sodium (VOLTAREN) 1 % GEL Apply 1 application topically as needed. 04/27/20   [provider]  Docusate Sodium 100 MG capsule Take 100 mg by mouth 2 (two) times daily as needed. 02/13/22   [provider]  empagliflozin (JARDIANCE) 10 MG TABS tablet Take 1 tablet (10 mg total) by mouth daily before breakfast. 08/08/22   Alisa Graff, FNP  Incontinence Supply Disposable (BLADDER CONTROL PADS EX ABSORB) MISC Apply 1 Dose topically as needed.  03/22/18   [provider]  isosorbide mononitrate (IMDUR) 60 MG 24 hr tablet Take 1 tablet (60 mg total) by mouth 2 (two) times daily. 04/20/13   Minna Merritts, MD  loratadine (CLARITIN) 10 MG tablet Take 10 mg by mouth daily. 07/20/19   [provider]  losartan (COZAAR) 25 MG tablet Take 1 tablet (25 mg total) by mouth daily. 08/05/21   Minna Merritts, MD  methocarbamol (ROBAXIN) 500 MG tablet Take by mouth. 06/05/22   [provider]  [facility] 17 GM/SCOOP powder SMARTSIG:2 Scoopful By Mouth Daily PRN 04/10/21   [provider]  omeprazole (PRILOSEC) 40 MG capsule Take 40 mg by mouth daily. 04/13/21   [provider]  oxybutynin (DITROPAN) 5 MG tablet Take 5 mg by mouth 2 (two) times daily.    [provider]  potassium chloride SA (KLOR-CON) 20 MEQ tablet Take 1 tablet (20 mEq total) by mouth 2 (two) times daily. 08/05/21  Minna Merritts, MD  Torsemide 40 MG TABS Take 40 mg by mouth daily in the afternoon. 03/06/22   Fritzi Mandes, MD   Physical Exam: Vitals:   08/22/22 1532 08/22/22 1600 08/22/22 1744 08/22/22 1754  BP:  (!) 111/56    Pulse:  67    Resp:  15    Temp:   98.1 F (36.7 C)   TempSrc:   Oral   SpO2: 95% 98%    Weight:    110.2 kg  Height:    _0  (1.727 m)   Constitutional: appears age-appropriate, frail, NAD, calm, comfortable Eyes: PERRL, lids and conjunctivae normal ENMT: Mucous membranes are moist. Posterior pharynx clear of any exudate or lesions.  Teeth loss. Hearing appropriate Neck: normal, supple, no masses, no thyromegaly Respiratory: clear to auscultation bilaterally, no wheezing, no crackles. Normal respiratory effort. No accessory muscle use.  Cardiovascular: Regular rate and rhythm, no murmurs / rubs / gallops. No extremity edema. 2+ pedal pulses. No carotid bruits.  Abdomen: no tenderness, no masses palpated, no hepatosplenomegaly. Bowel sounds positive.  Musculoskeletal: no clubbing / cyanosis. No joint deformity upper and lower extremities. Good ROM, no contractures, no atrophy. Normal muscle tone.  Skin: no rashes, lesions, ulcers. No induration Neurologic: Sensation intact. Strength 5/5 in all 4.  Psychiatric: Normal judgment and insight. Alert and oriented x 3. Normal mood.   EKG: independently reviewed, showing sinus rhythm with rate of 61, QTc 435  Chest x-ray on Admission: I personally reviewed and I agree with radiologist reading as below.  CT  HEAD WO CONTRAST (5MM)  Result Date: 08/22/2022 CLINICAL DATA:  Altered mental status, neurological deficit EXAM: CT HEAD WITHOUT CONTRAST TECHNIQUE: Contiguous axial images were obtained from the base of the skull through the vertex without intravenous contrast. RADIATION DOSE REDUCTION: This exam was performed according to the departmental dose-optimization program which includes automated exposure control, adjustment of the mA and/or kV according to patient size and/or use of iterative reconstruction technique. COMPARISON:  03/02/2022 FINDINGS: Brain: No acute intracranial findings are seen. There are no signs of bleeding within the cranium. Calcifications are noted in basal ganglia on both sides. Cortical sulci are prominent. Ventricles are not dilated. There is no focal edema or mass effect. Vascular: Unremarkable. Skull: Unremarkable. Sinuses/Orbits: Minimal frothy density is seen in sphenoid sinus. There are no air-fluid levels. Other: There is increased amount of CSF in the sella suggesting partial empty sella. This finding has not changed. IMPRESSION: No acute intracranial findings are seen in noncontrast CT brain. Atrophy. Electronically Signed   By: Elmer Picker M.D.   On: 08/22/2022 16:14   DG Chest Portable 1 View  Result Date: 08/22/2022 CLINICAL DATA:  Shortness of breath. EXAM: PORTABLE CHEST 1 VIEW COMPARISON:  Chest radiograph dated June 12, 2022; CT examination dated July 16, 2022 FINDINGS: The heart is enlarged. Aorta is ectatic. Low lung volumes with left basilar atelectasis or infiltrate. Thoracic spondylosis and bilateral glenohumeral osteoarthritis. IMPRESSION: 1. Cardiomegaly. 2. Low lung volumes with left basilar atelectasis or infiltrate. Electronically Signed   By: Keane Police D.O.   On: 08/22/2022 14:34    Labs on Admission: I have personally reviewed following labs CBC: Recent Labs  Lab 08/22/22 1402  WBC 4.9  NEUTROABS 2.7  HGB 10.0*  HCT 32.5*  MCV 86.9   PLT 160   Basic Metabolic Panel: Recent Labs  Lab 08/22/22 1402 08/22/22 1540  NA 139  --   K 4.7  --   CL  100  --   CO2 30  --   GLUCOSE 91  --   BUN 41*  --   CREATININE 1.83*  --   CALCIUM 9.2  --   MG  --  2.5*  PHOS  --  4.0   GFR: Estimated Creatinine Clearance: 34 mL/min (A) (by C-G formula based on SCr of 1.83 mg/dL (H)).  Liver Function Tests: Recent Labs  Lab 08/22/22 1402  AST 15  ALT 13  ALKPHOS 60  BILITOT 0.6  PROT 8.0  ALBUMIN 3.4*   CBG: Recent Labs  Lab 08/22/22 1405  GLUCAP 85   Thyroid Function Tests: Recent Labs    08/22/22 1402  TSH 0.265*  FREET4 1.37*   Urine analysis:    Component Value Date/Time   COLORURINE YELLOW (A) 08/22/2022 1540   APPEARANCEUR CLEAR (A) 08/22/2022 1540   APPEARANCEUR Clear 03/22/2014 1620   LABSPEC 1.011 08/22/2022 1540   LABSPEC 1.020 03/22/2014 1620   PHURINE 5.0 08/22/2022 1540   GLUCOSEU 50 (A) 08/22/2022 1540   GLUCOSEU Negative 03/22/2014 1620   HGBUR NEGATIVE 08/22/2022 1540   BILIRUBINUR NEGATIVE 08/22/2022 1540   BILIRUBINUR Negative 03/22/2014 1620   KETONESUR NEGATIVE 08/22/2022 1540   PROTEINUR NEGATIVE 08/22/2022 1540   NITRITE NEGATIVE 08/22/2022 1540   LEUKOCYTESUR LARGE (A) 08/22/2022 1540   LEUKOCYTESUR Negative 03/22/2014 1620   This document was prepared using Dragon Voice Recognition software and may include unintentional dictation errors.  Dr. Tobie Poet [facility]  If 7PM-7AM, please contact overnight-coverage provider If 7AM-7PM, please contact day coverage provider [email or web address]  08/22/2022, 8:05 PM

## 2022-08-22 NOTE — Assessment & Plan Note (Addendum)
-   Etiology workup in progress at this time, differential diagnosis includes heart failure, stroke, respiratory distress part/failure as patient is not able to wear her CPAP machine at night - CT of the head without contrast, complete echo - Patient may benefit from Holter monitor/cardiac event monitor prior to discharge if the workup is negative while inpatient - Check COVID/influenza A/influenza B/4 SP PCR, VBG for CO2 retention, magnesium, phosphorus, UA with microscopy, follow high sensitive troponin - Fall and aspiration precaution

## 2022-08-23 ENCOUNTER — Inpatient Hospital Stay
Admit: 2022-08-23 | Discharge: 2022-08-23 | Disposition: A | Discharge status: Home / Self Care | Payer: Medicare Other | Attending: Internal Medicine | Admitting: Internal Medicine

## 2022-08-23 DIAGNOSIS — G4733 Obstructive sleep apnea (adult) (pediatric): Secondary | ICD-10-CM

## 2022-08-23 DIAGNOSIS — N1831 Chronic kidney disease, stage 3a: Secondary | ICD-10-CM | POA: Diagnosis not present

## 2022-08-23 DIAGNOSIS — R55 Syncope and collapse: Secondary | ICD-10-CM | POA: Diagnosis not present

## 2022-08-23 LAB — BASIC METABOLIC PANEL
Anion gap: 5 (ref 5–15)
BUN: 37 mg/dL — ABNORMAL HIGH (ref 8–23)
CO2: 32 mmol/L (ref 22–32)
Calcium: 8.6 mg/dL — ABNORMAL LOW (ref 8.9–10.3)
Chloride: 104 mmol/L (ref 98–111)
Creatinine, Ser: 1.61 mg/dL — ABNORMAL HIGH (ref 0.44–1.00)
GFR, Estimated: 33 mL/min — ABNORMAL LOW (ref >60)
Glucose, Bld: 102 mg/dL — ABNORMAL HIGH (ref 70–99)
Potassium: 4.3 mmol/L (ref 3.5–5.1)
Sodium: 141 mmol/L (ref 135–145)

## 2022-08-23 LAB — CBC
HCT: 29.4 % — ABNORMAL LOW (ref 36.0–46.0)
Hemoglobin: 8.9 g/dL — ABNORMAL LOW (ref 12.0–15.0)
MCH: 26.3 pg (ref 26.0–34.0)
MCHC: 30.3 g/dL (ref 30.0–36.0)
MCV: 86.7 fL (ref 80.0–100.0)
Platelets: 261 10*3/uL (ref 150–400)
RBC: 3.39 MIL/uL — ABNORMAL LOW (ref 3.87–5.11)
RDW: 14.2 % (ref 11.5–15.5)
WBC: 3.7 10*3/uL — ABNORMAL LOW (ref 4.0–10.5)
nRBC: 0 % (ref 0.0–0.2)

## 2022-08-23 LAB — PROCALCITONIN: Procalcitonin: <0.1 ng/mL

## 2022-08-23 MED ORDER — ISOSORBIDE MONONITRATE ER 60 MG PO TB24: 60.0000 mg | ORAL_TABLET | Freq: Two times a day (BID) | ORAL | 0 refills | Status: DC

## 2022-08-23 MED ORDER — EMPAGLIFLOZIN 10 MG PO TABS: 10.0000 mg | ORAL_TABLET | Freq: Every day | ORAL | 0 refills | Status: DC

## 2022-08-23 MED ORDER — CLONIDINE HCL 0.1 MG PO TABS: 0.1000 mg | ORAL_TABLET | Freq: Two times a day (BID) | ORAL | 0 refills | Status: DC | PRN

## 2022-08-23 NOTE — Evaluation (Signed)
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 Pt is alert and oriented x4, fair safety awareness and awareness of current deficits. PTA pt reports she is amb with rollator household distances, uses mwc for community distances. Pt presents with deficits in strength, endurance, activity tolerance affecting safe and optimal ADL completion. Pt reports she will refuse STR, wants to discharge home with [place]. Education provided re: safety of ADLs, functional mobility at current level of functioning. Pt continues to report she will have assist at home. Pt requires increased time for all tasks however is able to perform functional transfers with CGA and can utilize mwc in home if needed. Pt is left in care of NT on bsc, all needs met. OT will follow acutely.      Recommendations for follow up therapy are one component of a multi-disciplinary discharge planning process, led by the attending physician.  Recommendations may be updated based on patient status, additional functional criteria and insurance authorization.   Follow Up Recommendations  Home health OT (recommend STR if pt does not have assist for ADLs at home, pt reports she plans to discharge home with [facility])     Assistance Recommended at Discharge Frequent or constant Supervision/Assistance  Patient can return home with the following A lot of help with walking and/or transfers;A lot of help with bathing/dressing/bathroom    Functional Status Assessment  Patient has had a recent decline in their functional status and  demonstrates the ability to make significant improvements in function in a reasonable and predictable amount of time.  Equipment Recommendations  Tub/shower seat    Recommendations for Other Services       Precautions / Restrictions Precautions Precautions: Fall Restrictions Weight Bearing Restrictions: No      Mobility Bed Mobility Overal bed mobility: Needs Assistance Bed Mobility: Supine to Sit     Supine to sit: Supervision, HOB elevated          Transfers Overall transfer level: Needs assistance Equipment used: Rolling walker (2 wheels) Transfers: Sit to/from Stand Sit to Stand: Min assist                  Balance Overall balance assessment: Needs assistance   Sitting balance-Leahy Scale: Good     Standing balance support: Bilateral upper extremity supported, During functional activity, Reliant on assistive device for balance Standing balance-Leahy Scale: Fair                             ADL either performed or assessed with clinical judgement   ADL Overall ADL's : Needs assistance/impaired     Grooming: Wash/dry hands;Wash/dry face;Sitting;Set up       Lower Body Bathing: Maximal assistance   Upper Body Dressing : Sitting;Set up Upper Body Dressing Details (indicate cue type and reason): doff shirt, donn gown Lower Body Dressing: Moderate assistance Lower Body Dressing Details (indicate cue type and reason): shoes Toilet Transfer: Min Dispensing optician Details (indicate cue type and reason): short amb transfer with significantly increased time Toileting- Clothing Manipulation and Hygiene: Minimal assistance;Sit to/from stand  Functional mobility during ADLs: Min guard;Rolling walker (2 wheels) (no more than 4' in room with RW, significantly increased time)       Vision Patient Visual Report: No change from baseline       Perception     Praxis      Pertinent Vitals/Pain Pain Assessment Pain  Assessment: Faces Faces Pain Scale: Hurts even more Pain Location: B knees Pain Descriptors / Indicators: Aching, Grimacing, Sore Pain Intervention(s): Limited activity within patient's tolerance, Monitored during session, Repositioned     Hand Dominance     Extremity/Trunk Assessment Upper Extremity Assessment Upper Extremity Assessment: Generalized weakness   Lower Extremity Assessment Lower Extremity Assessment: Generalized weakness       Communication Communication Communication: No difficulties   Cognition Arousal/Alertness: Awake/alert Behavior During Therapy: WFL for tasks assessed/performed Overall Cognitive Status: No family/caregiver present to determine baseline cognitive functioning Area of Impairment: Safety/judgement, Problem solving                         Safety/Judgement: Decreased awareness of deficits   Problem Solving: Requires verbal cues       General Comments  vital signs monitored, appear stable throughout    Exercises Other Exercises Other Exercises: edu re: Role of OT, role of rehab, discharge recommendations, home safety, falls prevention DME use   Shoulder Instructions      Home Living Family/patient expects to be discharged to:: Private residence Living Arrangements: Children Available Help at Discharge: Family;Available PRN/intermittently Type of Home: Apartment Home Access: Level entry     Home Layout: One level     Bathroom Shower/Tub: Tub/shower unit (sink bath at baseline per report)   Bathroom Toilet: Standard     Home Equipment: Rollator (4 wheels);Wheelchair - manual;BSC/3in1          Prior Functioning/Environment Prior Level of Function : History of Falls (last six months)             Mobility Comments: pt amb with rollator household distances, mwc community distances ADLs Comments: pt reports MOD I in ADL, assist for IADL as needed from family;        OT Problem List: Decreased  strength;Decreased activity tolerance;Impaired balance (sitting and/or standing);Decreased safety awareness;Decreased knowledge of use of DME or AE      OT Treatment/Interventions: Self-care/ADL training;Patient/family education;Therapeutic exercise;Therapeutic activities;Energy conservation;DME and/or AE instruction    OT Goals(Current goals can be found in the care plan section) Acute Rehab OT Goals Patient Stated Goal: return home OT Goal Formulation: With patient Time For Goal Achievement: 09/06/22 Potential to Achieve Goals: Good ADL Goals Pt Will Perform Grooming: sitting;with modified independence Pt Will Perform Lower Body Dressing: with supervision;sit to/from stand Pt Will Transfer to Toilet: with supervision;ambulating Pt Will Perform Toileting - Clothing Manipulation and hygiene: sit to/from stand;with supervision  OT Frequency: Min 2X/week    Co-evaluation              AM-PAC OT "6 Clicks" Daily Activity     Outcome Measure Help from another person eating meals?: None Help from another person taking care of personal grooming?: None Help from another person toileting, which includes using toliet, bedpan, or urinal?: A Little Help from another person bathing (including washing, rinsing, drying)?: A Lot Help from another person to put on and taking off regular upper body clothing?: None Help from another person to put on and taking off regular lower body clothing?: A Lot 6 Click Score: 19  End of Session Equipment Utilized During Treatment: Rolling walker (2 wheels) Nurse Communication: Mobility status  Activity Tolerance: Patient tolerated treatment well Patient left: Other (comment) (on bsc with NT present)  OT Visit Diagnosis: Unsteadiness on feet (R26.81);Muscle weakness (generalized) (M62.81)                Time: 2094-7096 OT Time Calculation (min): 30 min Charges:  OT General Charges $OT Visit: 1 Visit OT Evaluation $OT Eval Moderate Complexity: 1  Mod  Shanon Payor, OTD OTR/L  08/23/22, 1:48 PM

## 2022-08-23 NOTE — ED Notes (Signed)
Pts daughter called to transport pt home.

## 2022-08-23 NOTE — Discharge Instructions (Signed)
 *  Our office will arrange for a heart monitor to be mailed to your home.

## 2022-08-23 NOTE — Evaluation (Signed)
Physical Therapy Evaluation Patient Details Name: [name] MRN: [id number] DOB: [birth year] Today's Date: 08/23/2022  History of Present Illness  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  Clinical Impression  76 yo Female was admitted with near syncope event. She was observed overnight with no episodes. EKG showed 1 episode of AV block with bradycardia. Per MD she is stable to be discharged and will be referred to cardiology for heart monitor and further assessment. Patient lives with her daughter and family. She reports she tries to be independent and not rely on them for anything. She currently uses a rollator for mobility in the home and a manual wheelchair for community mobility. She is able to transfer from bed with RW, supervision with extra time/effort. She does compensate for chronic knee pain/weakness with increased hip flexion and decreased knee flexion which does limit transfer ability. She was not observed to walk during evaluation due to knee pain. She reports she often will sit on her rollator and/or wheelchair to get around her home. Patient has chronic wounds on BLE which she gets her legs wrapped by wound care for management. She reports as a result she is not supposed to get bandages wet and will therefore do sponge baths. She does exhibit weakness in BLE grossly 3/5. Recommend home health PT upon discharge for strengthening and mobility needs.    Recommendations for follow up therapy are one component of a multi-disciplinary discharge planning process, led by the attending physician.  Recommendations may be updated based on patient status, additional functional criteria and insurance  authorization.  Follow Up Recommendations Home health PT      Assistance Recommended at Discharge Intermittent Supervision/Assistance  Patient can return home with the following  A little help with walking and/or transfers;A little help with bathing/dressing/bathroom;Assistance with cooking/housework;Assist for transportation;Direct supervision/assist for medications management    Equipment Recommendations None recommended by PT  Recommendations for Other Services       Functional Status Assessment Patient has had a recent decline in their functional status and/or demonstrates limited ability to make significant improvements in function in a reasonable and predictable amount of time     Precautions / Restrictions Precautions Precautions: Fall Restrictions Weight Bearing Restrictions: No      Mobility  Bed Mobility               General bed mobility comments: sitting edge of bed during PT evaluation    Transfers Overall transfer level: Needs assistance Equipment used: Rolling walker (2 wheels) Transfers: Sit to/from Stand Sit to Stand: Supervision           General transfer comment: requires extra time/effort, exhibits heavy compensation with increased hip flexion and decreased knee flexion;    Ambulation/Gait               General Gait Details: gait not observed as patient reports increased knee pain; She reports she will use her manual wheelchair and/or sit on her rollator for mobility in her home.  Stairs            Wheelchair Mobility    Modified Rankin (Stroke Patients Only)       Balance Overall balance assessment: Needs assistance Sitting-balance support: Feet supported Sitting balance-Leahy Scale: Good  Standing balance support: Bilateral upper extremity supported, During functional activity, Reliant on assistive device for balance Standing balance-Leahy Scale: Fair Standing balance comment: heavy lean on BUE during standing  tasks                             Pertinent Vitals/Pain Pain Assessment Pain Assessment: 0-10 Pain Score: 6  Pain Location: B knees Pain Descriptors / Indicators: Aching, Grimacing, Sore Pain Intervention(s): Limited activity within patient's tolerance, Monitored during session, Repositioned    Home Living Family/patient expects to be discharged to:: Private residence Living Arrangements: Children Available Help at Discharge: Family;Available PRN/intermittently (reports her daughter/granddaughter are home all the time and can assist as needed.) Type of Home: Apartment Home Access: Level entry       Home Layout: One level Home Equipment: Rollator (4 wheels);Wheelchair - manual;BSC/3in1      Prior Function Prior Level of Function : History of Falls (last six months)             Mobility Comments: pt amb with rollator household distances, mwc community distances ADLs Comments: pt reports MOD I in ADL, assist for IADL as needed from family;     Hand Dominance        Extremity/Trunk Assessment   Upper Extremity Assessment Upper Extremity Assessment: Generalized weakness    Lower Extremity Assessment Lower Extremity Assessment: Generalized weakness    Cervical / Trunk Assessment Cervical / Trunk Assessment: Kyphotic  Communication   Communication: No difficulties  Cognition Arousal/Alertness: Awake/alert Behavior During Therapy: WFL for tasks assessed/performed Overall Cognitive Status: No family/caregiver present to determine baseline cognitive functioning Area of Impairment: Safety/judgement, Problem solving                         Safety/Judgement: Decreased awareness of deficits   Problem Solving: Requires verbal cues          General Comments General comments (skin integrity, edema, etc.): vital signs monitored, appear stable throughout    Exercises     Assessment/Plan    PT Assessment Patient needs continued PT services   PT Problem List Decreased strength;Decreased activity tolerance;Decreased balance;Decreased mobility       PT Treatment Interventions DME instruction;Balance training;Gait training;Functional mobility training;Patient/family education;Therapeutic activities;Therapeutic exercise    PT Goals (Current goals can be found in the Care Plan section)  Acute Rehab PT Goals Patient Stated Goal: to go home PT Goal Formulation: With patient Time For Goal Achievement: 09/06/22 Potential to Achieve Goals: Fair    Frequency Min 2X/week     Co-evaluation               AM-PAC PT "6 Clicks" Mobility  Outcome Measure Help needed turning from your back to your side while in a flat bed without using bedrails?: A Little Help needed moving from lying on your back to sitting on the side of a flat bed without using bedrails?: A Lot Help needed moving to and from a bed to a chair (including a wheelchair)?: A Little Help needed standing up from a chair using your arms (e.g., wheelchair or bedside chair)?: A Little Help needed to walk in hospital room?: A Lot Help needed climbing 3-5 steps with a railing? : Total 6 Click Score: 14    End of Session   Activity Tolerance: Patient limited by pain Patient left: in bed;with call bell/phone within reach Nurse Communication: Mobility status PT Visit Diagnosis: Unsteadiness on  feet (R26.81);Muscle weakness (generalized) (M62.81);History of falling (Z91.81);Difficulty in walking, not elsewhere classified (R26.2)    Time: 0940-7680 PT Time Calculation (min) (ACUTE ONLY): 13 min   Charges:   PT Evaluation $PT Eval Low Complexity: 1 Low          Alegra Rost PT, DPT 08/23/2022, 2:58 PM

## 2022-08-23 NOTE — Care Management Obs Status (Signed)
[place] NOTIFICATION   Patient Details  Name: [name] MRN: [id number] Date of Birth: 02-24-1946   Medicare Observation Status Notification Given:  Yes    Raina Mina, [place] 08/23/2022, 10:35 AM

## 2022-08-23 NOTE — Care Management CC44 (Signed)
Condition Code 44 Documentation Completed  Patient Details  Name: [name] MRN: [id number] Date of Birth: January 01, 1946   Condition Code 44 given:  Yes Patient signature on Condition Code 44 notice:  Yes Documentation of 2 MD's agreement:  Yes Code 44 added to claim:  Yes    Raina Mina, [employer] 08/23/2022, 10:35 AM

## 2022-08-23 NOTE — Discharge Summary (Signed)
Physician Discharge Summary   Patient: [name] MRN: [id number] DOB: 1946/04/22  Admit date:     08/22/2022  Discharge date: 08/23/22  Discharge Physician: Sharen Hones   PCP: Donnie Coffin, MD   Recommendations at discharge:   Follow-up with PCP in 1 week. Cardiology will call you to set up home heart monitor. Follow-up with Dr. Rockey Situ in 1 month.  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 [facility] Course: 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 Patient was monitored overnight, no additional event.  Reviewed the 7 EKGs performed, initially had a first-degree AV block with sinus bradycardia, lowest heart rate on telemetry was 41.  Patient was not on any beta-blocker. She had echocardiogram performed in 02/2022, ejection fraction 50 to 11%, grade 1 diastolic dysfunction, mild mitral regurgitation and mild aortic regurgitation. She appears to have chronic anemia, hemoglobin 8.9 today, discussed with the patient and the family, patient did not have any bleeding.  No black stool or rectal bleeding. At this point, patient is asymptomatic.  Medically stable to be discharged.  Assessment and Plan: * Near syncope Patient had similar episodes in the past, she describes as falling asleep suddenly.  This could be consistent with narcolepsy.   But she does have first-degree AV block and sinus tachycardia.  The degree of EKG changes does not seem to cause syncope.  However, cannot rule out high degree AV block intermittently.  Will set cardiac monitor as outpatient.  Discussed with cardiology, they will send device to the patient on Wednesday. Will also refer patient to be seen by neurology as outpatient.  Chronic kidney stage IIIa. AKI (acute kidney injury) ([place]) ruled out Patient renal function still stable, condition not consistent with AKI.  Pyuria No urine symptoms, no UTI.  Weakness Increase activity at home.  Morbid obesity ([place]) - This meets criteria for morbid obesity based on the presence of 1 or more chronic comorbidities. This complicates overall care and prognosis.   OSA (obstructive sleep apnea) - Patient reports that she used to wear a CPAP machine however it has been more than 5 years since her CPAP machine no longer works - Patient likely require a new sleep study in order to qualify and or receive new fitting for CPAP machine Follow-up with PCP to set up sleep study.  Essential hypertension Resume all blood pressure medicines, but hold off diuretics due to syncope.        Consultants: None Procedures performed: None  Disposition: Home Diet recommendation:  Discharge Diet Orders (From admission, onward)     Start     Ordered   08/23/22 0000  Diet - low sodium heart healthy        08/23/22 1005           Cardiac diet DISCHARGE MEDICATION: Allergies as of 08/23/2022   No Known Allergies  Medication List     STOP taking these medications    methocarbamol 500 MG tablet Commonly known as: ROBAXIN   potassium chloride SA 20 MEQ tablet Commonly known as: KLOR-CON M   Torsemide 40 MG Tabs       TAKE these medications    acetaminophen-codeine 300-30 MG tablet Commonly known as: TYLENOL #3 Take 1-2 tablets by mouth every 4 (four) hours as needed for moderate pain.    anastrozole 1 MG tablet Commonly known as: ARIMIDEX TAKE 1 TABLET BY MOUTH ONCE DAILY. MUST MAKE AN APPOINTMENT TO SEE DR. RAO.   aspirin EC 81 MG tablet Take 81 mg by mouth daily.   Bladder Control Pads Ex Absorb Misc Apply 1 Dose topically as needed.   cloNIDine 0.1 MG tablet Commonly known as: CATAPRES Take 1 tablet (0.1 mg total) by mouth 2 (two) times daily as needed (systolic BP > 694 or diastolic BP > 95).   [employer] Calcium + D3 600-20 MG-MCG Tabs Generic drug: Calcium Carb-Cholecalciferol TAKE 1 TABLET BY MOUTH TWICE A DAY   diclofenac Sodium 1 % Gel Commonly known as: VOLTAREN Apply 1 application topically as needed.   Docusate Sodium 100 MG capsule Take 100 mg by mouth 2 (two) times daily as needed.   empagliflozin 10 MG Tabs tablet Commonly known as: Jardiance Take 1 tablet (10 mg total) by mouth daily before breakfast.   isosorbide mononitrate 60 MG 24 hr tablet Commonly known as: Imdur Take 1 tablet (60 mg total) by mouth 2 (two) times daily.   loratadine 10 MG tablet Commonly known as: CLARITIN Take 10 mg by mouth daily.   losartan 25 MG tablet Commonly known as: COZAAR Take 1 tablet (25 mg total) by mouth daily.   MiraLax 17 GM/SCOOP powder Generic drug: polyethylene glycol powder SMARTSIG:2 Scoopful By Mouth Daily PRN   omeprazole 40 MG capsule Commonly known as: PRILOSEC Take 40 mg by mouth daily.   oxybutynin 5 MG tablet Commonly known as: DITROPAN Take 5 mg by mouth 2 (two) times daily.        Follow-up Information     Minna Merritts, MD Follow up in 1 month(s).   Specialty: Cardiology Why: We will arrange for heart monitor and office follow-up. Contact information: [place] [address] [phone]         Donnie Coffin, MD Follow up in 1 week(s).   Specialty: Family Medicine Contact information: [place] [address] [phone]                Discharge  Exam: Danley Danker Weights   08/22/22 1754  Weight: 110.2 kg   General exam: Appears calm and comfortable  Respiratory system: Clear to auscultation. Respiratory effort normal. Cardiovascular system: S1 & S2 heard, RRR. No JVD, murmurs, rubs, gallops or clicks. No pedal edema. Gastrointestinal system: Abdomen is nondistended, soft and nontender. No organomegaly or masses felt. Normal bowel sounds heard. Central nervous system: Alert and oriented. No focal neurological deficits. Extremities: Symmetric 5 x 5 power. Skin: No rashes, lesions or ulcers Psychiatry: Judgement and insight appear normal. Mood & affect appropriate.    Condition at discharge: good  The results of significant diagnostics from this hospitalization (including imaging, microbiology, ancillary and laboratory) are listed below for reference.   Imaging Studies: CT HEAD WO CONTRAST (5MM)  Result Date: 08/22/2022 CLINICAL DATA:  Altered mental status, neurological deficit EXAM: CT HEAD WITHOUT CONTRAST TECHNIQUE: Contiguous axial images  were obtained from the base of the skull through the vertex without intravenous contrast. RADIATION DOSE REDUCTION: This exam was performed according to the departmental dose-optimization program which includes automated exposure control, adjustment of the mA and/or kV according to patient size and/or use of iterative reconstruction technique. COMPARISON:  03/02/2022 FINDINGS: Brain: No acute intracranial findings are seen. There are no signs of bleeding within the cranium. Calcifications are noted in basal ganglia on both sides. Cortical sulci are prominent. Ventricles are not dilated. There is no focal edema or mass effect. Vascular: Unremarkable. Skull: Unremarkable. Sinuses/Orbits: Minimal frothy density is seen in sphenoid sinus. There are no air-fluid levels. Other: There is increased amount of CSF in the sella suggesting partial empty sella. This finding has not changed. IMPRESSION: No acute  intracranial findings are seen in noncontrast CT brain. Atrophy. Electronically Signed   By: Elmer Picker M.D.   On: 08/22/2022 16:14   DG Chest Portable 1 View  Result Date: 08/22/2022 CLINICAL DATA:  Shortness of breath. EXAM: PORTABLE CHEST 1 VIEW COMPARISON:  Chest radiograph dated June 12, 2022; CT examination dated July 16, 2022 FINDINGS: The heart is enlarged. Aorta is ectatic. Low lung volumes with left basilar atelectasis or infiltrate. Thoracic spondylosis and bilateral glenohumeral osteoarthritis. IMPRESSION: 1. Cardiomegaly. 2. Low lung volumes with left basilar atelectasis or infiltrate. Electronically Signed   By: Keane Police D.O.   On: 08/22/2022 14:34   US THYROID  Addendum Date: 08/07/2022   ADDENDUM REPORT: 08/07/2022 11:16 ADDENDUM: Addendum created to address a right-left transcription error The nodule labeled 7 on the study is incorrectly described in the right thyroid within the findings, and correctly described on the left side in the impression. This nodule is in fact a left-sided nodule, 3.4 cm x 3.4 cm x 2.9 cm, TR 3 and meets criteria for biopsy, as was previously stated. Electronically Signed   By: Corrie Mckusick D.O.   On: 08/07/2022 11:16   Result Date: 08/07/2022 CLINICAL DATA:  Goiter. EXAM: THYROID ULTRASOUND TECHNIQUE: Ultrasound examination of the thyroid gland and adjacent soft tissues was performed. COMPARISON:  None Available. FINDINGS: Parenchymal Echotexture: Mildly heterogenous Isthmus: 0.7 cm Right lobe: 5.0 x 3.6 x 3.4 cm Left lobe: 5.4 x 4.8 x 2.6 cm _________________________________________________________ Estimated total number of nodules >/= 1 cm: 6-10 Number of spongiform nodules >/=  2 cm not described below (TR1): 0 Number of mixed cystic and solid nodules >/= 1.5 cm not described below (TR2): 0 _________________________________________________________ Nodule # 1: 9 mm hypoechoic solid nodule in the right upper gland does not meet size  criteria to warrant further evaluation. _________________________________________________________ Nodule # 2: Location: Right; Mid Maximum size: 3.1 cm; Other 2 dimensions: 2.9 x 2.0 cm Composition: solid/almost completely solid (2) Echogenicity: isoechoic (1) Shape: not taller-than-wide (0) Margins: smooth (0) Echogenic foci: none (0) ACR TI-RADS total points: 3. ACR TI-RADS risk category: TR3 (3 points). ACR TI-RADS recommendations: **Given size (>/= 2.5 cm) and appearance, fine needle aspiration of this mildly suspicious nodule should be considered based on TI-RADS criteria. _________________________________________________________ Nodule # 3: 1.3 cm isoechoic solid nodule in the medial right mid gland. No further follow-up. _________________________________________________________ Nodule # 4: Location: Right; Inferior Maximum size: 1.2 cm; Other 2 dimensions: 1.2 x 1.2 cm Composition: solid/almost completely solid (2) Echogenicity: hypoechoic (2) Shape: not taller-than-wide (0) Margins: smooth (0) Echogenic foci: none (0) ACR TI-RADS total points: 4. ACR TI-RADS risk category: TR4 (4-6 points). ACR TI-RADS recommendations: *Given size (>/= 1 - 1.4 cm)  and appearance, a follow-up ultrasound in 1 year should be considered based on TI-RADS criteria. _________________________________________________________ Nodule # 5: Isoechoic solid nodule measuring less than 1.5 cm in the left mid gland. No further follow-up. _________________________________________________________ Nodule # 6: Pseudo nodule in the deep aspect of the right lower gland. _________________________________________________________ Nodule # 7: Location: Right; Inferior Maximum size: 3.4 cm; Other 2 dimensions: 3.4 x 2.9 cm Composition: solid/almost completely solid (2) Echogenicity: isoechoic (1) Shape: not taller-than-wide (0) Margins: ill-defined (0) Echogenic foci: none (0) ACR TI-RADS total points: 3. ACR TI-RADS risk category: TR3 (3 points). ACR  TI-RADS recommendations: **Given size (>/= 2.5 cm) and appearance, fine needle aspiration of this mildly suspicious nodule should be considered based on TI-RADS criteria. _________________________________________________________ IMPRESSION: 1. 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 2. Approximately 3.4 cm TI-RADS category 3 nodule in the left inferior gland (labeled # 7) meets criteria to consider fine-needle aspiration biopsy. 3. Approximately 3.1 cm TI-RADS category 3 nodule in the right mid gland (labeled # 2) meets criteria to consider fine-needle aspiration biopsy. 4. Nodule # 4 in the right inferior gland meets criteria for imaging surveillance. Recommend follow-up ultrasound in 1 year. The above is in keeping with the ACR TI-RADS recommendations - J Am Coll Radiol 2017;14:587-595. Electronically Signed: By: Jacqulynn Cadet M.D. On: 07/26/2022 10:16    Microbiology: Results for orders placed or performed during the hospital encounter of 08/22/22  Resp panel by RT-PCR (RSV, Flu A&B, Covid)     Status: None   Collection Time: 08/22/22  3:41 PM   Specimen: Nasal Swab  Result Value Ref Range Status   SARS Coronavirus 2 by RT PCR NEGATIVE NEGATIVE Final    Comment: (NOTE) SARS-CoV-2 target nucleic acids are NOT DETECTED.  The SARS-CoV-2 RNA is generally detectable in upper respiratory specimens during the acute phase of infection. The lowest concentration of SARS-CoV-2 viral copies this assay can detect is 138 copies/mL. A negative result does not preclude SARS-Cov-2 infection and should not be used as the sole basis for treatment or other patient management decisions. A negative result may occur with  improper specimen collection/handling, submission of specimen other than nasopharyngeal swab, presence of viral mutation(s) within the areas targeted by this assay, and inadequate number of viral copies(<138 copies/mL). A negative result  must be combined with clinical observations, patient history, and epidemiological information. The expected result is Negative.  Fact Sheet for Patients:  EntrepreneurPulse.com.au  Fact Sheet for Healthcare Providers:  IncredibleEmployment.be  This test is no t yet approved or cleared by the [place] FDA and  has been authorized for detection and/or diagnosis of SARS-CoV-2 by FDA under an Emergency Use Authorization (EUA). This EUA will remain  in effect (meaning this test can be used) for the duration of the COVID-19 declaration under Section 564(b)(1) of the Act, 21 U.S.C.section 360bbb-3(b)(1), unless the authorization is terminated  or revoked sooner.       Influenza A by PCR NEGATIVE NEGATIVE Final   Influenza B by PCR NEGATIVE NEGATIVE Final    Comment: (NOTE) The Xpert Xpress SARS-CoV-2/FLU/RSV plus assay is intended as an aid in the diagnosis of influenza from Nasopharyngeal swab specimens and should not be used as a sole basis for treatment. Nasal washings and aspirates are unacceptable for Xpert Xpress SARS-CoV-2/FLU/RSV testing.  Fact Sheet for Patients: EntrepreneurPulse.com.au  Fact Sheet for Healthcare Providers: IncredibleEmployment.be  This test is not yet approved or cleared by the [place] FDA and has been  authorized for detection and/or diagnosis of SARS-CoV-2 by FDA under an Emergency Use Authorization (EUA). This EUA will remain in effect (meaning this test can be used) for the duration of the COVID-19 declaration under Section 564(b)(1) of the Act, 21 U.S.C. section 360bbb-3(b)(1), unless the authorization is terminated or revoked.     Resp Syncytial Virus by PCR NEGATIVE NEGATIVE Final    Comment: (NOTE) Fact Sheet for Patients: EntrepreneurPulse.com.au  Fact Sheet for Healthcare Providers: IncredibleEmployment.be  This test is  not yet approved or cleared by the [place] FDA and has been authorized for detection and/or diagnosis of SARS-CoV-2 by FDA under an Emergency Use Authorization (EUA). This EUA will remain in effect (meaning this test can be used) for the duration of the COVID-19 declaration under Section 564(b)(1) of the Act, 21 U.S.C. section 360bbb-3(b)(1), unless the authorization is terminated or revoked.  Performed at [facility], [address] [place], [place] [address]     Labs: CBC: Recent Labs  Lab 08/22/22 1402 08/23/22 0511  WBC 4.9 3.7*  NEUTROABS 2.7  --   HGB 10.0* 8.9*  HCT 32.5* 29.4*  MCV 86.9 86.7  PLT 350 702   Basic Metabolic Panel: Recent Labs  Lab 08/22/22 1402 08/22/22 1540 08/23/22 0511  NA 139  --  141  K 4.7  --  4.3  CL 100  --  104  CO2 30  --  32  GLUCOSE 91  --  102*  BUN 41*  --  37*  CREATININE 1.83*  --  1.61*  CALCIUM 9.2  --  8.6*  MG  --  2.5*  --   PHOS  --  4.0  --    Liver Function Tests: Recent Labs  Lab 08/22/22 1402  AST 15  ALT 13  ALKPHOS 60  BILITOT 0.6  PROT 8.0  ALBUMIN 3.4*   CBG: Recent Labs  Lab 08/22/22 1405  GLUCAP 85    Discharge time spent: greater than 30 minutes.  Signed: Sharen Hones, MD [facility] 08/23/2022

## 2022-08-25 ENCOUNTER — Ambulatory Visit: Payer: Medicare Other | Attending: Nurse Practitioner

## 2022-08-25 ENCOUNTER — Other Ambulatory Visit: Payer: Self-pay | Admitting: Cardiovascular Disease

## 2022-08-25 ENCOUNTER — Telehealth: Payer: Self-pay | Admitting: *Deleted

## 2022-08-25 DIAGNOSIS — R001 Bradycardia, unspecified: Secondary | ICD-10-CM

## 2022-08-25 DIAGNOSIS — R55 Syncope and collapse: Secondary | ICD-10-CM

## 2022-08-25 NOTE — Telephone Encounter (Signed)
-----   Message from Theora Gianotti, NP sent at 08/23/2022  8:06 AM EST ----- Regarding: going home from ED on Sat - Zio AT ordered Hey guys,  I placed an order for a Zio AT for this lady that presented w/ near syncope and bradycardia.  She's being d/c'd from the ED this AM (Saturday).  Would you pls make sure that everything follows through and let me know if there are any issues w/ my order (Future, [facility], home enrollment.. hopefully that'll do the trick).  Also, would you pls arrange for a TG or APP f/u in 1 month?  I didn't see her, so no need for it to be me.  Thanks,  Gerald Stabs

## 2022-08-25 NOTE — Telephone Encounter (Signed)
Reviewed the patient's chart. I do not see the ZIO order in her appointments as an auto arrived appointment. I also looked on the ZIO website and the patient is not coming up.  Order placed again today.  This has crossed over to her appointments and is viewable on the ZIO website.   The patient has a follow up scheduled with [name] Faith, PA on 09/29/22 at 9:15 am.

## 2022-08-25 NOTE — Telephone Encounter (Signed)
Please contact patient to schedule overdue 6 month follow up, last seen by Gollan 08-05-21.  Refill pending appt.  Thank you

## 2022-08-26 ENCOUNTER — Encounter: Payer: Medicare Other | Admitting: Physician Assistant

## 2022-08-26 ENCOUNTER — Other Ambulatory Visit: Payer: Self-pay | Admitting: Cardiovascular Disease

## 2022-08-26 DIAGNOSIS — I890 Lymphedema, not elsewhere classified: Secondary | ICD-10-CM | POA: Diagnosis not present

## 2022-08-26 NOTE — Progress Notes (Signed)
HAWA, HENLY ([id number]) [id number] Page 1 of 14 Visit Report for 08/26/2022 Chief Complaint Document Details Patient Name: Date of Service: [name] 08/26/2022 2:00 PM Medical Record Number: [id number] Patient Account Number: [id number] Date of Birth/Sex: Treating RN: 01/19/1946 (76 y.o. [name] Primary Care Provider: [name] Other Clinician: Referring Provider: Treating Provider/Extender: Mayer Masker, Ngwe Weeks in Treatment: 34 Information Obtained from: Patient Chief Complaint Bilateral LE Ulcers Electronic Signature(s) Signed: 08/26/2022 2:35:05 PM By: Worthy Keeler PA-C Entered By: Worthy Keeler on 08/26/2022 14:35:04 -------------------------------------------------------------------------------- HPI Details Patient Name: Date of Service: [name] 08/26/2022 2:00 PM Medical Record Number: [id number] Patient Account Number: [id number] Date of Birth/Sex: Treating RN: 28-Aug-1946 (76 y.o. [name] Primary Care Provider: [name] Other Clinician: Referring Provider: Treating Provider/Extender: Mayer Masker, Ngwe Weeks in Treatment: 25 History of Present Illness HPI Description: 03/15/2020 upon evaluation today patient presents today for initial evaluation here in our clinic concerning a wound that is on the left posterior lower extremity. Unfortunately this has been giving the patient some discomfort at this point which she notes has been affected her pretty much daily. The patient does have a history of venous insufficiency, hypertension, congestive heart failure, and a history of breast cancer. Fortunately there does not appear to be evidence of active infection at this time which is great news. No fevers, chills, nausea, vomiting, or diarrhea. Wound is not extremely large but does have some slough covering the surface of the wound. This is going require sharp debridement  today. 03/15/2020 upon evaluation today patient appears to be doing fairly well in regard to her wound. She does have some slough noted on the surface of the wound currently but I do believe the Iodoflex has been beneficial over the past week. There is no signs of active infection at this time which is great news and overall very pleased with the progress. No fevers, chills, nausea, vomiting, or diarrhea. 04/02/2020 upon evaluation today patient appears to be doing a little better in regard to her wound size wise this is not tremendously smaller she does have some slough buildup on the surface of the wound. With that being said this is going require some sharp debridement today to clear away some of the necrotic debris. Fortunately there is no evidence of active infection at this time. No fevers, chills, nausea, vomiting, or diarrhea. 04/10/2020 on evaluation today patient appears to be doing well with regard to her ulcer which is measuring somewhat smaller today. She actually has more granulation tissue noted which is also good news is no need for sharp debridement today. Fortunately there is no evidence of infection either which is also excellent. No fevers, chills, nausea, vomiting, or diarrhea. [name], [name] ([id number]) [id number] Page 2 of 14 04/26/2020 on evaluation today patient's wound actually is showing signs of good improvement which is great news. There does not appear to be any evidence of active infection. I do believe she is tolerating the collagen at this point. 05/03/2020 upon evaluation today patient's wound actually showed signs of good granulation at this time there does not appear to be any evidence of active infection which is great news and overall very pleased with where things stand. With that being said I do believe that the patient is can require some sharp debridement today but fortunately nothing too significant. 05/11/20 on evaluation today  patient appears to be doing well in regard to her leg ulcer. She's been tolerating  the dressing changes without complication. Fortunately there is no signs of active infection at this time. Overall I'm very pleased with where things stand. 05/18/2020 upon evaluation today patient's wound is showing signs of improvement is very dry and I think the collagen for the most part just dried to the wound bed. I am going to clean this away with sharp debridement in order to get this under control in my opinion. 05/25/2020 upon evaluation today patient actually appears to be doing well in regard to her leg ulcer. In fact upon inspection it appears she could potentially be completely healed but that is not guaranteed based on what we are seeing. There does not appear to be any signs of active infection which is great news. 05/31/2020 on evaluation today patient appears to be doing well with regard to her wounds. She in fact appears to be almost completely healed on the left leg there is just a very small opening remaining point 1 06/08/2020 upon evaluation today patient appears to be doing excellent in regard to her leg ulcer on the left in fact it appears to be that she is completely healed as of today. I see no signs of active infection at this time which is great news. No fevers, chills, nausea, vomiting, or diarrhea. READMISSION 01/16/2021 Patient that we have had in this clinic with a wound on the left posterior calf in the summer 2021 extending into October. She was felt to have chronic venous insufficiency. Per the patient's description she was discharged with what sounds like external compression stockings although she could not afford the "$75 per leg". She therefore did not get anything. Apparently her wound that she has currently is been in existence since January. She has been followed at vein and vascular with Unna boots and I think calcium alginate. She had ABIs done on 06/25/2020 that showed an  noncompressible ABI on the right leg and the left leg but triphasic waveforms with good great toe pressures and a TBI of 0.90 on the right and 0.91 on the left Surprisingly the patient also had venous reflux studies that were really unremarkable. This included no evidence of DVT or SVT but there was no evidence of s s deep venous insufficiency or superficial venous insufficiency in the greater saphenous or short saphenous veins bilaterally. I would wonder about more central venous issues or perhaps this is all lymphedema 01/25/2021 upon evaluation today patient appears to be doing decently well and guard to her wound. The good news is the Iodoflex that is job she saw Dr. Dellia Nims last week for readmission and to be honest I think that this has done extremely well over that week. I do believe that she can tolerate the sharp debridement today which will be good. I think that cleaning off the wound will likely allow [place] to be able to use a different type of dressing I do not think the Iodoflex will even be necessary based on how clean the wound looks. Fortunately there is no sign of active infection at this time which is great news. No fevers, chills, nausea, vomiting, or diarrhea. 02/12/2021 upon evaluation today patient appears to be doing well with regard to her leg ulcer. We did switch to alginate when she came for nurse visit I think is doing much better for her. Fortunately there does not appear to be any signs of active infection at this time which is great news. No fevers, chills, nausea, vomiting, or diarrhea. 02/18/2021 upon evaluation today patient's wound  is actually showing signs of good improvement. I am very pleased with where things stand currently. I do not see any signs of active infection which is great news and overall I feel like she is making good progress. The patient likewise is happy that things are doing so well and that this is measuring somewhat smaller. 02/25/2021 upon evaluation  today patient actually appears to be doing quite well in regard to her wounds. She has been tolerating the dressing changes without complication. Fortunately there does not appear to be any signs of active infection which is great news and overall I am extremely pleased with where things stand. No fevers, chills, nausea, vomiting, or diarrhea. She does have a lot of edema I really do think she needs compression socks to be worn in order to prevent things from causing additional issues for her currently. Especially on the right leg she should be wearing compression socks all the time to be honest. 03/04/2021 upon evaluation today patient's leg ulcer actually appears to be doing pretty well which is great news. There does not appear to be any significant change but overall the appearance is better even though the size is not necessarily reflecting a great improvement. Fortunately there does not appear to be any signs of active infection. No fevers, chills, nausea, vomiting, or diarrhea. 03/12/2021 upon evaluation today patient appears to be doing well with regard to her wounds. She has been tolerating the dressing changes without complication. The good news is that the wound on her left lateral leg is doing much better and is showing signs of good improvement. Overall her swelling is controlled well as well. She does need some compression socks she plans to go Jefferey socks next week. 03/18/2021 on evaluation today patient's wound actually is showing signs of good improvement. She does have a little bit of slough this can require some sharp debridement today. 03/25/2021 upon evaluation today patient appears to be doing well with regard to her wound on the left lateral leg. She has been tolerating the dressing changes without complication. Fortunately there does not appear to be any signs of active infection at this time. No fever chills no 04/01/2021 upon evaluation today patient's wound is showing signs of  improvement and overall very pleased with where things stand at this point. There is no evidence of active infection which is great news as well and in general I think that she is making great progress. 04/09/2021 upon evaluation today patient appears to be doing well with regard to her ankle ulcer. She is making good progress currently which is great news. There does not appear to be any signs of infection also excellent news. In general I am extremely pleased with where things stand today. No fevers, chills, nausea, vomiting, or diarrhea. 04/23/2021 upon inspection today patient appears to be doing quite well with regard to her leg ulcer. She has been tolerating the dressing changes without complication. She is can require some sharp debridement today but overall seems to be doing excellent. 05/06/2021 upon evaluation today patient unfortunately appears to be doing poorly overall in regard to her swelling. She is actually significantly more swollen than last week despite the fact that her wound is doing better this is not a good trend. I think that she probably needs to see her cardiologist ASAP and I discussed that with her today. This includes the fact that honestly I think she probably is getting need an increase in her diuretic or something to try to clear  away some of the excess fluid that she is experiencing at the moment. She is not been doing her pumps even twice a day but again right now more concerned that if she were to do the pump she would fluid overload her lungs causing other issues as far as her breathing is concerned. Obviously I think she needs to contact them and move up the appointment for the 12th of something sooner 2 weeks is a bit too long to wait with how swollen she has. 05/14/2021 upon evaluation today patient's wound actually showing signs of being a little bit smaller compared to previous. She has been making good progress however. Fortunately there does not appear to be any  evidence of active infection which is great. Overall I am extremely pleased in that regard. Nonetheless I am still concerned that she is having quite a bit of issue as far as the swelling is concerned she has been placed on fluid restriction as well as an increase in the furosemide by her doctor. We will see how things progress. 05/21/2021 upon evaluation today patient appears to be doing well with regard to her wound in general. She has been tolerating the dressing changes without KATLYNNE, MCKERCHER ([id number]) [id number] Page 3 of 14 complication. With that being said she has been using silver cell for a while and while this was showing signs of improvement it is now gotten to the point where this has slowed down quite significantly. I do believe that switching to a different dressing may be beneficial for her today. 05/28/2021 upon evaluation today patient actually appears to be doing quite well in regard to her wound. In fact there is really not any need for sharp debridement today based on what I see. The Hydrofera Blue is doing great and overall I think that she is managing quite nicely with the compression wrapping. Her edema is still down quite a bit with the wrapping in place. 06/11/2021 upon evaluation today patient appears to be doing well with regard to her leg ulcer. She has been tolerating the dressing changes without complication. Fortunately there does not appear to be any signs of active infection at this time which is great news. No fevers, chills, nausea, vomiting, or diarrhea. She is going require some sharp debridement today. 10/11; left leg ulcer much better looking and with improved surface area. She is using Hydrofera Blue under 3 layer compression. She does not have any stocking on the right leg which in itself has very significant nonpitting edema 06/25/2021 upon evaluation today patient appears to be doing well with regard to the wound on her left  leg. She has been tolerating the dressing changes without complication and overall I am extremely pleased with where things stand today. I do not see any evidence of infection which is great news. 07/02/2021 upon evaluation today patient's wound actually showing signs of excellent improvement and actually very pleased with where we stand today and the overall appearance. Fortunately there does not appear to be any signs of active infection. No fevers, chills, nausea, vomiting, or diarrhea. 07/09/2021 upon evaluation today patient appears to be doing better in regard to her wound this is measuring smaller and she is headed in the appropriate direction based on what I am seeing currently. I do not see any evidence of active infection systemically which is great news. 07/16/2021 upon evaluation today patient appears to be doing pretty well currently in regard to her leg ulcer. This is measuring smaller and looking much better  this is great news. Fortunately there does not appear to be any evidence of active infection systemically which is great news as well. 07/23/2021 upon evaluation today patient's wound is actually showing signs of good improvement this is definitely measuring smaller. I do not see any signs of infection which is great news and overall very pleased in that regard. Overall I think that she is doing well with the [facility]. 07/30/2020 upon evaluation today patient appears to be doing well in regard to her wound. She has been tolerating the dressing changes without complication think the Hydrofera Blue at this point is actually causing her to dry out a lot as far as the wound bed is concerned. Subsequently I think that we need to try to see what we can do to improve this. Fortunately there does not appear to be any evidence of active infection at this time which is great news. 08/06/2021 upon evaluation today patient appears to be doing well with regards to her wound. Is not measuring  significantly smaller initial inspection but upon closer inspection she has a lot of new skin growth across the central portion of the wound. I think will get very close to complete resolution. 08/13/2021 upon evaluation today patient appears to be doing well with regard to her wound. In fact this appears to be I believe healed although there is still some question as to whether it is completely so. I am concerned about the fact that to be honest she still has some evidence of dry skin that could be just trapping something underneath I still want to debride anything as I am afraid of causing some damage to the good skin for that reason I Georgina Peer probably monitor for 1 more week before completely closing everything out. 08/20/2021 upon evaluation today patient actually appears to be doing excellent. In fact she appears to be completely healed. This was the case last week as well we just wanted to make sure nothing reopened since she does not really have an ability to put on compression socks this is good to be something we need to make sure was well-healed. Nonetheless I think we have achieved that goal as of today. 12/27; this is a patient we discharged 2 weeks ago with wounds on her left lower leg laterally. She was supposed to get stockings and really did not get them. She developed blistering and reopening probably sometime late last week. She has 3 areas in the same area as previously. The mid calf dimensions on the left went from 39 to 51 cm today. She also has very significant edema on the right leg but as far as I am aware has not had wounds in this area 1/4; patient presents for follow-up. She has tolerated the compression wrap well. She has no issues or complaints today. 09/17/2021 upon evaluation today patient appears to be doing well with regard to her wounds. She has been tolerating the dressing changes without complication. Fortunately there does not appear to be any signs of active infection  at this time. No fevers, chills, nausea, vomiting, or diarrhea. 10/01/2021 upon evaluation today patient appears to be doing well with regard to her wound this is actually measuring better and looking smaller and I am very pleased in that regard. Fortunately I do not see any evidence of active infection locally nor systemically at this point. No fevers, chills, nausea, vomiting, or diarrhea. 10/08/2021 upon evaluation patient's wounds are actually showing some signs of improvement measuring a little bit smaller  and looking a little bit better. Overall I do not think there is any need for sharp debridement today which is good news. No fevers, chills, nausea, vomiting, or diarrhea. 10/14/2021 upon evaluation today patient appears to be doing well with regard to her wound. She has been tolerating the dressing changes without complication and overall I am extremely pleased with where things stand today. There is a little bit of film on the surface of the wounds but this was carefully cleaned away with just saline and gauze she really did not want me to do any sharp debridement today. 10/29/2021 upon evaluation today patient appears to be doing quite well in regard to her wounds. She is actually showing signs of excellent improvement which is great news and overall I feel like we are on the right track at this time. She does have a wound on both legs and she does need something compression wise when she heals for that reason we will get a go and see about ordering her bilateral juxta lite compression wraps. 11/05/2021 upon evaluation today patient appears to be doing well with regard to her wound. She has been tolerating the dressing changes without complication. Fortunately I do not see any evidence of active infection locally nor systemically at this time which is great news. No fevers, chills, nausea, vomiting, or diarrhea. 11/12/2021 upon evaluation today patient appears to be doing well with regard to her  wounds. She is doing excellent on the left on the right this is not doing quite as well there is some slough and biofilm buildup. 3/14; patient presents for follow-up. She has no issues or complaints today. She has tolerated the compression wraps well. 11/26/2021 upon evaluation today patient appears to be doing well with regard to her wounds. Both are showing signs of good improvement which is great news. I do not see any evidence of active infection and overall think she is healing quite nicely. 12/03/2021 upon evaluation today patient appears to be doing well with regard to her wounds. Both are showing signs of excellent improvement I am very pleased with where things stand and I think that she is making good progress here. Fortunately I do not see any signs of active infection locally or systemically which is great news. No fevers, chills, nausea, vomiting, or diarrhea. 12-10-2021 upon evaluation today patient's wounds actually appear to be doing awesome. I am extremely pleased with where we stand I think she is making excellent progress. I do not see any signs of active infection locally or systemically at this time. 12-17-2021 upon evaluation today patient's wounds are actually showing signs of good improvement. Fortunately I do not see any evidence of active infection locally nor systemically which is great news. Overall I do not believe that she is showing any signs of significant worsening which is good news. With that being said I do believe that she has a little bit of film on the surface of the wound I was actually able to clear this away with saline and gauze no sharp debridement was necessary. [name], [name] ([id number]) [id number] Page 4 of 14 12-24-2021 upon evaluation today patient appears to be doing well with regard to her wounds. I do not see any signs of active infection currently which is great news and overall I think that we are on the right track  here. No fevers, chills, nausea, vomiting, or diarrhea. 12-31-2021 upon evaluation today patient appears to be doing well with regard to her wounds. Both are showing signs  of improvement the wound on the right leg is going require some sharp debridement on the left seems to be doing quite well. 01-07-2022 upon evaluation today patient appears to be doing well currently in regard to her wounds. She is actually making good progress bilaterally. Both of them require little bit of sharp debridement but not too much which is great news. Upon inspection patient's wound bed actually showed signs of good granulation and epithelization on both legs. She still is having significant issues with the right leg compared to the left although I do feel like both are actually showing signs of pretty good improvement. 01-21-2022 upon evaluation today patient's wound is actually showing signs of excellent improvement I am very pleased with where we stand currently. I do not see any signs of active infection locally or systemically which is great news. No fevers, chills, nausea, vomiting, or diarrhea. 01-28-2022 upon evaluation today patient appears to be doing well with regard to her wound on the left leg this is pretty much just about healed which is great news. Unfortunately in regard to the wound on her right leg this is not doing nearly as well in fact I think we probably need to see about a culture today that was discussed with her. 02-04-2022 upon evaluation today patient appears to be doing well with regard to her left leg which in fact might actually be completely healed I am not 100% sure. Nonetheless this is shown signs of excellent improvement which is great news. With that being said in regards to the right leg there is some slough and film buildup on the surface of the wound although she is feeling much better than last week this does appear to be doing much better than it was last week. Fortunately there does  not appear to be any signs of active infection locally or systemically at this time. 02-11-2022 upon evaluation today patient's wounds appear to be doing decently well. In fact the left leg is healed although there is brand-new skin were definitely given still need to wrap her for the time being. On the right leg this is showing signs of some epithelial growth in the middle of the wound it still measures the same because there is still a larger area with speckled openings but again as far as the entire area being open this is significantly improved which is great news. I am very pleased. 6/13; left leg remains healed and she has a juxta light to apply to it today. There is some concern about whether she is going to be able to do this at home. She lives with a daughter although she has her own disability. On the right the wound looks smaller to me nice epithelialization good edema control. 02-25-2022 upon evaluation today patient appears to be doing well with regard to her legs the left leg is still healed the right leg is doing much better. I think we are on the right track here. With that being said unfortunately she continues to have significant issues with some other problems and she actually tells me on Saturday she was having lunch and went inside to sit down when she tells me she had all of a sudden a sharp sensation that went from her hand through her arm into her leg and her neck and she tells me that she could not see anything for the next hour it was completely black. Slowly after this her vision started to return and has been normal since. It sounds  to me as if she may have had a TIA or mini stroke at this point. Nonetheless I think that she needs to have this checked out ASAP. Her primary care provider is Dr. Clide Deutscher at [facility]. We will get a try to get in touch with him. 03-10-2022 upon evaluation today patient's wound actually showed signs of good improvement. She seems to be making  excellent progress here. I do not see any evidence of active infection locally or systemically which is great news. No fevers, chills, nausea, vomiting, or diarrhea. She was in the hospital since have seen her last she ended up finding out that she was having syncopal episodes. That is what was going on with the blacking out and losing vision. Subsequently they adjusted some of her medication she seems to be doing better. 03-18-2022 upon evaluation today patient's wound is actually showing signs of good granulation and epithelization at this point. Fortunately I do not see any signs of active infection locally or systemically which is great news. 03-25-2022 any evidence of active infection locally or systemically which is great news. Any evidence of active infection which is great news and overall the wound is looking better. Upon evaluation today patient appears to be doing well with regard to her leg ulcer. She has been tolerating the dressing changes without complication. Fortunately I do not see 7/25; the patient's wound is on the right lateral lower extremity using silver alginate 3 layer compression. She has chronic venous insufficiency and lymphedema. We are using a juxta lite on the left leg which does not have any open wounds but she is leaving this on all week it seems that there just is not a way to get this changed at home 04-08-2022 upon evaluation today patient appears to be doing well currently in regard to her wound. She has been tolerating the dressing changes without complication. Fortunately I do not see any evidence of active infection locally or systemically which is great news. No fever or chills noted her left leg has reopened at this point. 04-15-2022 upon evaluation patient's wounds actually are showing signs of improvement which is great news. Little by little I do believe her making progress here. In regard to the right leg this is looking better though the measurement does not  speak to it there is a lot of new skin growth. On the left leg this is significantly smaller compared to last week most of the blistered area has completely closed. 04-22-2022 upon evaluation today patient appears to be doing excellent in regard to her wounds both are showing signs of improvement. Her knees and above are still extremely swollen the legs were rewrapped them are down significantly. With that being said I do not think that we will get a be able to keep them that way once were not rapid noted that she really has no way to be able to put juxta lites on and off and nobody to help her with that. That is the biggest concern that we have here at this point. Fortunately I do not think that there is any evidence of infection which is great news. 04-29-2022 upon evaluation today patient appears to be doing well with regard to her wounds. She has been tolerating the dressing changes without complication. Fortunately there does not appear to be any evidence of infection locally or systemically which is great news and overall I am extremely pleased in that regard. Left leg is healed the right leg is very close we discussed  today the possibility of her granddaughter helping with putting on and off compression wraps for her. 05-06-2022 upon evaluation today patient appears to be doing well currently in regard to her left leg which is showing signs of being completely healed. With regard to her right leg she still has an open area though this is doing much better there is a lot less drainage than what we previously noted. Fortunately I see no evidence of active infection locally or systemically at this time which is great news. 05-20-2022 upon evaluation today patient appears to be doing excellent in regard to her leg. The left leg is still healed the right leg is much better. Fortunately I see no signs of infection locally or systemically at this time which is great news. No fevers, chills, nausea,  vomiting, or diarrhea. 05-27-2022 upon evaluation today patient appears to be doing excellent in regard to her wound this is actually measuring smaller and looking much better. Fortunately I see no signs of active infection locally or systemically at this time. 06-03-2022 upon evaluation patient's wound actually is measuring smaller and looking much better. She still has a lot of discomfort but fortunately nothing to significant at this point which is great news. No fevers, chills, nausea, vomiting, or diarrhea. I think we are making some pretty good progress here. 06-10-2022 upon evaluation today patient appears to be doing well currently in regard to her wound which is actually measuring somewhat smaller. This is actually 2 separate wounds that are clustered and this is making it appear little bit larger than what it is but nonetheless we are seeing some definite improvement. Fortunately I do not see any signs of active infection at this time. No fevers, chills, nausea, vomiting, or diarrhea. 06-17-2022 upon evaluation today patient appears to be doing well currently in regard to her wound although she was in the hospital since she was last here. [name], [name] ([id number]) [id number] Page 5 of 14 That was from 06-12-2022 through 06-16-2022 which was yesterday. This was due to a CHF exacerbation. With that being said the patient has noted in her record review acute on chronic congestive heart failure with hypertensive emergency noted. She also has stage IIIa chronic kidney disease noted. With that being said I do think her wound is much better because I got a lot of fluid off to try to diurese her to a degree but her kidneys would not hold for that they put her on furosemide 40 mg and that is helping to get the fluid off she is much better but still has a lot of fluid buildup. 06-24-2022 upon evaluation today patient appears to be doing well currently in regard to her wound  is not significantly smaller but it is improved overall. Fortunately I do not see any signs of active infection locally nor systemically at this time. 07-01-2022 upon evaluation today patient appears to be doing okay in regard to her wound I do not believe the silver alginate is helping quite as much as I would like to see. I would actually recommend we switch back over to the [facility] at this point which I think may do better. Fortunately I do not see any signs of infection which is good news were going to switch to Tubigrip on the left leg on the right leg we will get a continue with the compression wrap. 07-08-2022 upon evaluation today patient unfortunately is not doing nearly as well in regard to her left leg. We had to put her  back into a 4-layer compression wrap today due to a significant blister that appeared. I do believe that we need to try to get this to reabsorb we will see how things do over the next week. With that being said the right leg is doing well the 4-layer compression wrap and switch to the [facility] seems to be very beneficial I am pleased with what we are seeing in that regard. 11/7; patient's wounds are on the right posterior lateral leg actually measures smaller and look healthy we have been using Hydrofera Blue under 3 layer compression. Last week she had a large blister that was left intact covered with alginate but the leg was wrapped in 3 layer. Today the left leg had unroofed but the skin still looks reasonably healthy. Nevertheless she is still going to require the left leg to be wrapped 07-22-2022 upon evaluation today patient unfortunately is extremely swollen in regard to her bilateral lower extremities despite the fact that she is currently being compression wrap a 4-layer compression wrap bilaterally. This is unfortunate and the fact that I believe this is not just a lower extremity swelling issue but to be honest this is a much more global issue with  edema in regard to her congestive heart failure. Her BNP has been up in the past month and I am not sure where it is as it has been rechecked she sees the heart failure clinic on Thursday in 2 days. With that being said she does tell me that she is having trouble laying down she tells me that she is having trouble walking as well. With all that being said I advised that she probably needs to go to the hospital for further evaluation. 07-29-2022 upon evaluation today patient appears to be doing well currently in regard to her wound on the right leg I am very pleased the left leg is also doing better though it is weeping quite a bit. I think we need to do something to Help calm this down a bit. She is in agreement with that plan. 08-05-2022 upon evaluation today patient's wounds are showing signs of improvement albeit slowly. Fortunately I do not see any evidence of infection locally nor systemically at this time which is great news. No fevers, chills, nausea, vomiting, or diarrhea. 08-12-2022 upon evaluation today patient actually appears to be doing well currently in regard to her wounds which is showing signs of improvement. Fortunately I do not see any signs of active infection locally nor systemically at this time which is great news. No fevers, chills, nausea, vomiting, or diarrhea. 08-19-2022 upon evaluation today patient appears to be doing decently well in regard to her wounds. She has been tolerating the dressing changes without complication. Fortunately there does not appear to be any signs of active infection locally nor systemically at this time which is great news. No fevers, chills, nausea, vomiting, or diarrhea. 08-26-2022 upon evaluation today patient appears to be doing decently well in regard to the wounds on her lower extremities. Both are showing signs of improvement which is great news. I do think that she may benefit from switching over to an Unna boot wrap instead of the 3 layer  compression wrap just due to the fact that her wraps do seem to be trying to slide a lot more on her unfortunately. The patient is in agreement with that plan. Fortunately I do not see any signs of active infection at this time which is great news and overall I am  extremely pleased with where we stand currently. Electronic Signature(s) Signed: 08/26/2022 2:54:15 PM By: Worthy Keeler PA-C Entered By: Worthy Keeler on 08/26/2022 14:54:15 -------------------------------------------------------------------------------- Physical Exam Details Patient Name: Date of Service: [name] 08/26/2022 2:00 PM Medical Record Number: [id number] Patient Account Number: [id number] Date of Birth/Sex: Treating RN: 09-15-1945 (76 y.o. [name] Primary Care Provider: [name] Other Clinician: Referring Provider: Treating Provider/Extender: Mayer Masker, Ngwe Weeks in Treatment: 74 Constitutional Obese and well-hydrated in no acute distress. Respiratory normal breathing without difficulty. Psychiatric this patient is able to make decisions and demonstrates good insight into disease process. Alert and Oriented x 3. pleasant and cooperative. [name], [name] ([id number]) [id number] Page 6 of 14 Notes Upon inspection patient's wound bed actually showed signs of good granulation epithelization at this point. Fortunately I do not see any evidence of infection locally nor systemically which is great news and overall I am extremely pleased with where we stand. No fevers, chills, nausea, vomiting, or diarrhea. Electronic Signature(s) Signed: 08/26/2022 2:54:57 PM By: Worthy Keeler PA-C Entered By: Worthy Keeler on 08/26/2022 14:54:57 -------------------------------------------------------------------------------- Physician Orders Details Patient Name: Date of Service: [name] 08/26/2022 2:00 PM Medical Record Number: [id number] Patient  Account Number: [id number] Date of Birth/Sex: Treating RN: 05-Sep-1946 (76 y.o. [name] Primary Care Provider: [name] Other Clinician: Referring Provider: Treating Provider/Extender: Mayer Masker, Ngwe Weeks in Treatment: 46 Verbal / Phone Orders: No Diagnosis Coding ICD-10 Coding Code Description I89.0 Lymphedema, not elsewhere classified I87.332 Chronic venous hypertension (idiopathic) with ulcer and inflammation of left lower extremity L97.812 Non-pressure chronic ulcer of other part of right lower leg with fat layer exposed Follow-up Appointments Return Appointment in 1 week. Nurse Visit as needed Bathing/ Shower/ Hygiene May shower with wound dressing protected with water repellent cover or cast protector. No tub bath. Anesthetic (Use 'Patient Medications' Section for Anesthetic Order Entry) Lidocaine applied to wound bed Edema Control - Lymphedema / Segmental Compressive Device / Other Bilateral Lower Extremities Optional: One layer of unna paste to top of compression wrap (to act as an anchor). Elevate, Exercise Daily and A void Standing for Long Periods of Time. Elevate legs to the level of the heart and pump ankles as often as possible Elevate leg(s) parallel to the floor when sitting. Compression Pump: Use compression pump on left lower extremity for 60 minutes, twice daily. - You may pump over wraps Compression Pump: Use compression pump on right lower extremity for 60 minutes, twice daily. - You may pump over wraps Wound Treatment Wound #4 - Lower Leg Wound Laterality: Right, Lateral Cleanser: Soap and Water 1 x Per Week/30 Days Discharge Instructions: Gently cleanse wound with antibacterial soap, rinse and pat dry prior to dressing wounds Cleanser: Wound Cleanser 1 x Per Week/30 Days Discharge Instructions: Wash your hands with soap and water. Remove old dressing, discard into plastic bag and place into trash. Cleanse the wound with Wound Cleanser  prior to applying a clean dressing using gauze sponges, not tissues or cotton balls. Do not scrub or use excessive force. Pat dry using gauze sponges, not tissue or cotton balls. Prim Dressing: Silvercel Small 2x2 (in/in) 1 x Per Week/30 Days ary Discharge Instructions: Apply Silvercel Small 2x2 (in/in) as instructed Compression Wrap: UNNA BOOT - Unna-Z Zinc and Calamine Boot, 4x10 (in/yd) 1 x Per Week/30 Days Discharge Instructions: Use to anchor wrap in place. Apply one strip around leg 3-finger widths below the knee to  secure the wrap. Wound #6 - Lower Leg Wound Laterality: Left, Medial [name], [name] ([id number]) [id number] Page 7 of 14 Cleanser: Soap and Water 1 x Per Week/30 Days Discharge Instructions: Gently cleanse wound with antibacterial soap, rinse and pat dry prior to dressing wounds Cleanser: Wound Cleanser 1 x Per Week/30 Days Discharge Instructions: Wash your hands with soap and water. Remove old dressing, discard into plastic bag and place into trash. Cleanse the wound with Wound Cleanser prior to applying a clean dressing using gauze sponges, not tissues or cotton balls. Do not scrub or use excessive force. Pat dry using gauze sponges, not tissue or cotton balls. Prim Dressing: Silvercel Small 2x2 (in/in) 1 x Per Week/30 Days ary Discharge Instructions: Apply Silvercel Small 2x2 (in/in) as instructed Compression Wrap: UNNA BOOT - Unna-Z Zinc and Calamine Boot, 4x10 (in/yd) 1 x Per Week/30 Days Discharge Instructions: Use to anchor wrap in place. Apply one strip around leg 3-finger widths below the knee to secure the wrap. Electronic Signature(s) Signed: 08/26/2022 4:11:06 PM By: Worthy Keeler PA-C Signed: 08/27/2022 4:14:17 PM By: Carlene Coria RN Previous Signature: 08/26/2022 2:36:01 PM Version By: Carlene Coria RN Entered By: Carlene Coria on 08/26/2022  14:41:33 -------------------------------------------------------------------------------- Problem List Details Patient Name: Date of Service: [name] 08/26/2022 2:00 PM Medical Record Number: [id number] Patient Account Number: [id number] Date of Birth/Sex: Treating RN: [birth year] (76 y.o. [name] Primary Care Provider: [name] Other Clinician: Referring Provider: Treating Provider/Extender: Mayer Masker, Ngwe Weeks in Treatment: 29 Active Problems ICD-10 Encounter Code Description Active Date MDM Diagnosis I89.0 Lymphedema, not elsewhere classified 01/16/2021 No Yes I87.332 Chronic venous hypertension (idiopathic) with ulcer and inflammation of left 01/16/2021 No Yes lower extremity L97.812 Non-pressure chronic ulcer of other part of right lower leg with fat layer 01/07/2022 No Yes exposed Inactive Problems ICD-10 Code Description Active Date Inactive Date L97.822 Non-pressure chronic ulcer of other part of left lower leg with fat layer exposed 01/16/2021 01/16/2021 Resolved Problems [name], [name] ([id number]) [id number] Page 8 of 14 Electronic Signature(s) Signed: 08/26/2022 2:34:59 PM By: Worthy Keeler PA-C Entered By: Worthy Keeler on 08/26/2022 14:34:58 -------------------------------------------------------------------------------- Progress Note Details Patient Name: Date of Service: [name] 08/26/2022 2:00 PM Medical Record Number: [id number] Patient Account Number: [id number] Date of Birth/Sex: Treating RN: Jun 30, 1946 (76 y.o. [name] Primary Care Provider: [name] Other Clinician: Referring Provider: Treating Provider/Extender: Mayer Masker, Ngwe Weeks in Treatment: 37 Subjective Chief Complaint Information obtained from Patient Bilateral LE Ulcers History of Present Illness (HPI) 03/15/2020 upon evaluation today patient presents today for initial evaluation here in our  clinic concerning a wound that is on the left posterior lower extremity. Unfortunately this has been giving the patient some discomfort at this point which she notes has been affected her pretty much daily. The patient does have a history of venous insufficiency, hypertension, congestive heart failure, and a history of breast cancer. Fortunately there does not appear to be evidence of active infection at this time which is great news. No fevers, chills, nausea, vomiting, or diarrhea. Wound is not extremely large but does have some slough covering the surface of the wound. This is going require sharp debridement today. 03/15/2020 upon evaluation today patient appears to be doing fairly well in regard to her wound. She does have some slough noted on the surface of the wound currently but I do believe the Iodoflex has been beneficial over the past week. There is no signs  of active infection at this time which is great news and overall very pleased with the progress. No fevers, chills, nausea, vomiting, or diarrhea. 04/02/2020 upon evaluation today patient appears to be doing a little better in regard to her wound size wise this is not tremendously smaller she does have some slough buildup on the surface of the wound. With that being said this is going require some sharp debridement today to clear away some of the necrotic debris. Fortunately there is no evidence of active infection at this time. No fevers, chills, nausea, vomiting, or diarrhea. 04/10/2020 on evaluation today patient appears to be doing well with regard to her ulcer which is measuring somewhat smaller today. She actually has more granulation tissue noted which is also good news is no need for sharp debridement today. Fortunately there is no evidence of infection either which is also excellent. No fevers, chills, nausea, vomiting, or diarrhea. 04/26/2020 on evaluation today patient's wound actually is showing signs of good improvement which is  great news. There does not appear to be any evidence of active infection. I do believe she is tolerating the collagen at this point. 05/03/2020 upon evaluation today patient's wound actually showed signs of good granulation at this time there does not appear to be any evidence of active infection which is great news and overall very pleased with where things stand. With that being said I do believe that the patient is can require some sharp debridement today but fortunately nothing too significant. 05/11/20 on evaluation today patient appears to be doing well in regard to her leg ulcer. She's been tolerating the dressing changes without complication. Fortunately there is no signs of active infection at this time. Overall I'm very pleased with where things stand. 05/18/2020 upon evaluation today patient's wound is showing signs of improvement is very dry and I think the collagen for the most part just dried to the wound bed. I am going to clean this away with sharp debridement in order to get this under control in my opinion. 05/25/2020 upon evaluation today patient actually appears to be doing well in regard to her leg ulcer. In fact upon inspection it appears she could potentially be completely healed but that is not guaranteed based on what we are seeing. There does not appear to be any signs of active infection which is great news. 05/31/2020 on evaluation today patient appears to be doing well with regard to her wounds. She in fact appears to be almost completely healed on the left leg there is just a very small opening remaining point 1 06/08/2020 upon evaluation today patient appears to be doing excellent in regard to her leg ulcer on the left in fact it appears to be that she is completely healed as of today. I see no signs of active infection at this time which is great news. No fevers, chills, nausea, vomiting, or diarrhea. READMISSION 01/16/2021 Patient that we have had in this clinic with a wound  on the left posterior calf in the summer 2021 extending into October. She was felt to have chronic venous insufficiency. Per the patient's description she was discharged with what sounds like external compression stockings although she could not afford the "$75 per leg". She therefore did not get anything. Apparently her wound that she has currently is been in existence since January. She has been followed at vein and vascular with Unna boots and I think calcium alginate. She had ABIs done on 06/25/2020 that showed an noncompressible ABI on  the right leg and the left leg but triphasic waveforms with good great toe pressures and a TBI of 0.90 on the right and 0.91 on the left Surprisingly the patient also had venous reflux studies that were really unremarkable. This included no evidence of DVT or SVT but there was no evidence of s s AI, [place] ([id number]) [id number] Page 9 of 14 deep venous insufficiency or superficial venous insufficiency in the greater saphenous or short saphenous veins bilaterally. I would wonder about more central venous issues or perhaps this is all lymphedema 01/25/2021 upon evaluation today patient appears to be doing decently well and guard to her wound. The good news is the Iodoflex that is job she saw Dr. Dellia Nims last week for readmission and to be honest I think that this has done extremely well over that week. I do believe that she can tolerate the sharp debridement today which will be good. I think that cleaning off the wound will likely allow [place] to be able to use a different type of dressing I do not think the Iodoflex will even be necessary based on how clean the wound looks. Fortunately there is no sign of active infection at this time which is great news. No fevers, chills, nausea, vomiting, or diarrhea. 02/12/2021 upon evaluation today patient appears to be doing well with regard to her leg ulcer. We did switch to alginate when she came  for nurse visit I think is doing much better for her. Fortunately there does not appear to be any signs of active infection at this time which is great news. No fevers, chills, nausea, vomiting, or diarrhea. 02/18/2021 upon evaluation today patient's wound is actually showing signs of good improvement. I am very pleased with where things stand currently. I do not see any signs of active infection which is great news and overall I feel like she is making good progress. The patient likewise is happy that things are doing so well and that this is measuring somewhat smaller. 02/25/2021 upon evaluation today patient actually appears to be doing quite well in regard to her wounds. She has been tolerating the dressing changes without complication. Fortunately there does not appear to be any signs of active infection which is great news and overall I am extremely pleased with where things stand. No fevers, chills, nausea, vomiting, or diarrhea. She does have a lot of edema I really do think she needs compression socks to be worn in order to prevent things from causing additional issues for her currently. Especially on the right leg she should be wearing compression socks all the time to be honest. 03/04/2021 upon evaluation today patient's leg ulcer actually appears to be doing pretty well which is great news. There does not appear to be any significant change but overall the appearance is better even though the size is not necessarily reflecting a great improvement. Fortunately there does not appear to be any signs of active infection. No fevers, chills, nausea, vomiting, or diarrhea. 03/12/2021 upon evaluation today patient appears to be doing well with regard to her wounds. She has been tolerating the dressing changes without complication. The good news is that the wound on her left lateral leg is doing much better and is showing signs of good improvement. Overall her swelling is controlled well as well. She  does need some compression socks she plans to go Jefferey socks next week. 03/18/2021 on evaluation today patient's wound actually is showing signs of good improvement. She does have a little bit  of slough this can require some sharp debridement today. 03/25/2021 upon evaluation today patient appears to be doing well with regard to her wound on the left lateral leg. She has been tolerating the dressing changes without complication. Fortunately there does not appear to be any signs of active infection at this time. No fever chills no 04/01/2021 upon evaluation today patient's wound is showing signs of improvement and overall very pleased with where things stand at this point. There is no evidence of active infection which is great news as well and in general I think that she is making great progress. 04/09/2021 upon evaluation today patient appears to be doing well with regard to her ankle ulcer. She is making good progress currently which is great news. There does not appear to be any signs of infection also excellent news. In general I am extremely pleased with where things stand today. No fevers, chills, nausea, vomiting, or diarrhea. 04/23/2021 upon inspection today patient appears to be doing quite well with regard to her leg ulcer. She has been tolerating the dressing changes without complication. She is can require some sharp debridement today but overall seems to be doing excellent. 05/06/2021 upon evaluation today patient unfortunately appears to be doing poorly overall in regard to her swelling. She is actually significantly more swollen than last week despite the fact that her wound is doing better this is not a good trend. I think that she probably needs to see her cardiologist ASAP and I discussed that with her today. This includes the fact that honestly I think she probably is getting need an increase in her diuretic or something to try to clear away some of the excess fluid that she is  experiencing at the moment. She is not been doing her pumps even twice a day but again right now more concerned that if she were to do the pump she would fluid overload her lungs causing other issues as far as her breathing is concerned. Obviously I think she needs to contact them and move up the appointment for the 12th of something sooner 2 weeks is a bit too long to wait with how swollen she has. 05/14/2021 upon evaluation today patient's wound actually showing signs of being a little bit smaller compared to previous. She has been making good progress however. Fortunately there does not appear to be any evidence of active infection which is great. Overall I am extremely pleased in that regard. Nonetheless I am still concerned that she is having quite a bit of issue as far as the swelling is concerned she has been placed on fluid restriction as well as an increase in the furosemide by her doctor. We will see how things progress. 05/21/2021 upon evaluation today patient appears to be doing well with regard to her wound in general. She has been tolerating the dressing changes without complication. With that being said she has been using silver cell for a while and while this was showing signs of improvement it is now gotten to the point where this has slowed down quite significantly. I do believe that switching to a different dressing may be beneficial for her today. 05/28/2021 upon evaluation today patient actually appears to be doing quite well in regard to her wound. In fact there is really not any need for sharp debridement today based on what I see. The Hydrofera Blue is doing great and overall I think that she is managing quite nicely with the compression wrapping. Her edema is still down  quite a bit with the wrapping in place. 06/11/2021 upon evaluation today patient appears to be doing well with regard to her leg ulcer. She has been tolerating the dressing changes without complication. Fortunately  there does not appear to be any signs of active infection at this time which is great news. No fevers, chills, nausea, vomiting, or diarrhea. She is going require some sharp debridement today. 10/11; left leg ulcer much better looking and with improved surface area. She is using Hydrofera Blue under 3 layer compression. She does not have any stocking on the right leg which in itself has very significant nonpitting edema 06/25/2021 upon evaluation today patient appears to be doing well with regard to the wound on her left leg. She has been tolerating the dressing changes without complication and overall I am extremely pleased with where things stand today. I do not see any evidence of infection which is great news. 07/02/2021 upon evaluation today patient's wound actually showing signs of excellent improvement and actually very pleased with where we stand today and the overall appearance. Fortunately there does not appear to be any signs of active infection. No fevers, chills, nausea, vomiting, or diarrhea. 07/09/2021 upon evaluation today patient appears to be doing better in regard to her wound this is measuring smaller and she is headed in the appropriate direction based on what I am seeing currently. I do not see any evidence of active infection systemically which is great news. 07/16/2021 upon evaluation today patient appears to be doing pretty well currently in regard to her leg ulcer. This is measuring smaller and looking much better this is great news. Fortunately there does not appear to be any evidence of active infection systemically which is great news as well. 07/23/2021 upon evaluation today patient's wound is actually showing signs of good improvement this is definitely measuring smaller. I do not see any signs of infection which is great news and overall very pleased in that regard. Overall I think that she is doing well with the [facility]. 07/30/2020 upon evaluation today patient  appears to be doing well in regard to her wound. She has been tolerating the dressing changes without complication think the Hydrofera Blue at this point is actually causing her to dry out a lot as far as the wound bed is concerned. Subsequently I think that we need to try to see what we can do to improve this. Fortunately there does not appear to be any evidence of active infection at this time which is great news. 08/06/2021 upon evaluation today patient appears to be doing well with regards to her wound. Is not measuring significantly smaller initial inspection but upon YAMIRA, PAPA ([id number]) [id number] Page 10 of 14 closer inspection she has a lot of new skin growth across the central portion of the wound. I think will get very close to complete resolution. 08/13/2021 upon evaluation today patient appears to be doing well with regard to her wound. In fact this appears to be I believe healed although there is still some question as to whether it is completely so. I am concerned about the fact that to be honest she still has some evidence of dry skin that could be just trapping something underneath I still want to debride anything as I am afraid of causing some damage to the good skin for that reason I Georgina Peer probably monitor for 1 more week before completely closing everything out. 08/20/2021 upon evaluation today patient actually appears to be doing excellent. In fact  she appears to be completely healed. This was the case last week as well we just wanted to make sure nothing reopened since she does not really have an ability to put on compression socks this is good to be something we need to make sure was well-healed. Nonetheless I think we have achieved that goal as of today. 12/27; this is a patient we discharged 2 weeks ago with wounds on her left lower leg laterally. She was supposed to get stockings and really did not get them. She developed blistering and  reopening probably sometime late last week. She has 3 areas in the same area as previously. The mid calf dimensions on the left went from 39 to 51 cm today. She also has very significant edema on the right leg but as far as I am aware has not had wounds in this area 1/4; patient presents for follow-up. She has tolerated the compression wrap well. She has no issues or complaints today. 09/17/2021 upon evaluation today patient appears to be doing well with regard to her wounds. She has been tolerating the dressing changes without complication. Fortunately there does not appear to be any signs of active infection at this time. No fevers, chills, nausea, vomiting, or diarrhea. 10/01/2021 upon evaluation today patient appears to be doing well with regard to her wound this is actually measuring better and looking smaller and I am very pleased in that regard. Fortunately I do not see any evidence of active infection locally nor systemically at this point. No fevers, chills, nausea, vomiting, or diarrhea. 10/08/2021 upon evaluation patient's wounds are actually showing some signs of improvement measuring a little bit smaller and looking a little bit better. Overall I do not think there is any need for sharp debridement today which is good news. No fevers, chills, nausea, vomiting, or diarrhea. 10/14/2021 upon evaluation today patient appears to be doing well with regard to her wound. She has been tolerating the dressing changes without complication and overall I am extremely pleased with where things stand today. There is a little bit of film on the surface of the wounds but this was carefully cleaned away with just saline and gauze she really did not want me to do any sharp debridement today. 10/29/2021 upon evaluation today patient appears to be doing quite well in regard to her wounds. She is actually showing signs of excellent improvement which is great news and overall I feel like we are on the right track at  this time. She does have a wound on both legs and she does need something compression wise when she heals for that reason we will get a go and see about ordering her bilateral juxta lite compression wraps. 11/05/2021 upon evaluation today patient appears to be doing well with regard to her wound. She has been tolerating the dressing changes without complication. Fortunately I do not see any evidence of active infection locally nor systemically at this time which is great news. No fevers, chills, nausea, vomiting, or diarrhea. 11/12/2021 upon evaluation today patient appears to be doing well with regard to her wounds. She is doing excellent on the left on the right this is not doing quite as well there is some slough and biofilm buildup. 3/14; patient presents for follow-up. She has no issues or complaints today. She has tolerated the compression wraps well. 11/26/2021 upon evaluation today patient appears to be doing well with regard to her wounds. Both are showing signs of good improvement which is great news. I  do not see any evidence of active infection and overall think she is healing quite nicely. 12/03/2021 upon evaluation today patient appears to be doing well with regard to her wounds. Both are showing signs of excellent improvement I am very pleased with where things stand and I think that she is making good progress here. Fortunately I do not see any signs of active infection locally or systemically which is great news. No fevers, chills, nausea, vomiting, or diarrhea. 12-10-2021 upon evaluation today patient's wounds actually appear to be doing awesome. I am extremely pleased with where we stand I think she is making excellent progress. I do not see any signs of active infection locally or systemically at this time. 12-17-2021 upon evaluation today patient's wounds are actually showing signs of good improvement. Fortunately I do not see any evidence of active infection locally nor systemically  which is great news. Overall I do not believe that she is showing any signs of significant worsening which is good news. With that being said I do believe that she has a little bit of film on the surface of the wound I was actually able to clear this away with saline and gauze no sharp debridement was necessary. 12-24-2021 upon evaluation today patient appears to be doing well with regard to her wounds. I do not see any signs of active infection currently which is great news and overall I think that we are on the right track here. No fevers, chills, nausea, vomiting, or diarrhea. 12-31-2021 upon evaluation today patient appears to be doing well with regard to her wounds. Both are showing signs of improvement the wound on the right leg is going require some sharp debridement on the left seems to be doing quite well. 01-07-2022 upon evaluation today patient appears to be doing well currently in regard to her wounds. She is actually making good progress bilaterally. Both of them require little bit of sharp debridement but not too much which is great news. Upon inspection patient's wound bed actually showed signs of good granulation and epithelization on both legs. She still is having significant issues with the right leg compared to the left although I do feel like both are actually showing signs of pretty good improvement. 01-21-2022 upon evaluation today patient's wound is actually showing signs of excellent improvement I am very pleased with where we stand currently. I do not see any signs of active infection locally or systemically which is great news. No fevers, chills, nausea, vomiting, or diarrhea. 01-28-2022 upon evaluation today patient appears to be doing well with regard to her wound on the left leg this is pretty much just about healed which is great news. Unfortunately in regard to the wound on her right leg this is not doing nearly as well in fact I think we probably need to see about a culture  today that was discussed with her. 02-04-2022 upon evaluation today patient appears to be doing well with regard to her left leg which in fact might actually be completely healed I am not 100% sure. Nonetheless this is shown signs of excellent improvement which is great news. With that being said in regards to the right leg there is some slough and film buildup on the surface of the wound although she is feeling much better than last week this does appear to be doing much better than it was last week. Fortunately there does not appear to be any signs of active infection locally or systemically at this time. 02-11-2022 upon  evaluation today patient's wounds appear to be doing decently well. In fact the left leg is healed although there is brand-new skin were definitely given still need to wrap her for the time being. On the right leg this is showing signs of some epithelial growth in the middle of the wound it still measures the same because there is still a larger area with speckled openings but again as far as the entire area being open this is significantly improved which is great news. I am very pleased. 6/13; left leg remains healed and she has a juxta light to apply to it today. There is some concern about whether she is going to be able to do this at home. She lives with a daughter although she has her own disability. On the right the wound looks smaller to me nice epithelialization good edema control. [name], [name] ([id number]) [id number] Page 11 of 14 02-25-2022 upon evaluation today patient appears to be doing well with regard to her legs the left leg is still healed the right leg is doing much better. I think we are on the right track here. With that being said unfortunately she continues to have significant issues with some other problems and she actually tells me on Saturday she was having lunch and went inside to sit down when she tells me she had all of a  sudden a sharp sensation that went from her hand through her arm into her leg and her neck and she tells me that she could not see anything for the next hour it was completely black. Slowly after this her vision started to return and has been normal since. It sounds to me as if she may have had a TIA or mini stroke at this point. Nonetheless I think that she needs to have this checked out ASAP. Her primary care provider is Dr. Clide Deutscher at [facility]. We will get a try to get in touch with him. 03-10-2022 upon evaluation today patient's wound actually showed signs of good improvement. She seems to be making excellent progress here. I do not see any evidence of active infection locally or systemically which is great news. No fevers, chills, nausea, vomiting, or diarrhea. She was in the hospital since have seen her last she ended up finding out that she was having syncopal episodes. That is what was going on with the blacking out and losing vision. Subsequently they adjusted some of her medication she seems to be doing better. 03-18-2022 upon evaluation today patient's wound is actually showing signs of good granulation and epithelization at this point. Fortunately I do not see any signs of active infection locally or systemically which is great news. 03-25-2022 any evidence of active infection locally or systemically which is great news. Any evidence of active infection which is great news and overall the wound is looking better. Upon evaluation today patient appears to be doing well with regard to her leg ulcer. She has been tolerating the dressing changes without complication. Fortunately I do not see 7/25; the patient's wound is on the right lateral lower extremity using silver alginate 3 layer compression. She has chronic venous insufficiency and lymphedema. We are using a juxta lite on the left leg which does not have any open wounds but she is leaving this on all week it seems that there just is not  a way to get this changed at home 04-08-2022 upon evaluation today patient appears to be doing well currently in regard to her wound. She has  been tolerating the dressing changes without complication. Fortunately I do not see any evidence of active infection locally or systemically which is great news. No fever or chills noted her left leg has reopened at this point. 04-15-2022 upon evaluation patient's wounds actually are showing signs of improvement which is great news. Little by little I do believe her making progress here. In regard to the right leg this is looking better though the measurement does not speak to it there is a lot of new skin growth. On the left leg this is significantly smaller compared to last week most of the blistered area has completely closed. 04-22-2022 upon evaluation today patient appears to be doing excellent in regard to her wounds both are showing signs of improvement. Her knees and above are still extremely swollen the legs were rewrapped them are down significantly. With that being said I do not think that we will get a be able to keep them that way once were not rapid noted that she really has no way to be able to put juxta lites on and off and nobody to help her with that. That is the biggest concern that we have here at this point. Fortunately I do not think that there is any evidence of infection which is great news. 04-29-2022 upon evaluation today patient appears to be doing well with regard to her wounds. She has been tolerating the dressing changes without complication. Fortunately there does not appear to be any evidence of infection locally or systemically which is great news and overall I am extremely pleased in that regard. Left leg is healed the right leg is very close we discussed today the possibility of her granddaughter helping with putting on and off compression wraps for her. 05-06-2022 upon evaluation today patient appears to be doing well currently in  regard to her left leg which is showing signs of being completely healed. With regard to her right leg she still has an open area though this is doing much better there is a lot less drainage than what we previously noted. Fortunately I see no evidence of active infection locally or systemically at this time which is great news. 05-20-2022 upon evaluation today patient appears to be doing excellent in regard to her leg. The left leg is still healed the right leg is much better. Fortunately I see no signs of infection locally or systemically at this time which is great news. No fevers, chills, nausea, vomiting, or diarrhea. 05-27-2022 upon evaluation today patient appears to be doing excellent in regard to her wound this is actually measuring smaller and looking much better. Fortunately I see no signs of active infection locally or systemically at this time. 06-03-2022 upon evaluation patient's wound actually is measuring smaller and looking much better. She still has a lot of discomfort but fortunately nothing to significant at this point which is great news. No fevers, chills, nausea, vomiting, or diarrhea. I think we are making some pretty good progress here. 06-10-2022 upon evaluation today patient appears to be doing well currently in regard to her wound which is actually measuring somewhat smaller. This is actually 2 separate wounds that are clustered and this is making it appear little bit larger than what it is but nonetheless we are seeing some definite improvement. Fortunately I do not see any signs of active infection at this time. No fevers, chills, nausea, vomiting, or diarrhea. 06-17-2022 upon evaluation today patient appears to be doing well currently in regard to her wound although  she was in the hospital since she was last here. That was from 06-12-2022 through 06-16-2022 which was yesterday. This was due to a CHF exacerbation. With that being said the patient has noted in her record review  acute on chronic congestive heart failure with hypertensive emergency noted. She also has stage IIIa chronic kidney disease noted. With that being said I do think her wound is much better because I got a lot of fluid off to try to diurese her to a degree but her kidneys would not hold for that they put her on furosemide 40 mg and that is helping to get the fluid off she is much better but still has a lot of fluid buildup. 06-24-2022 upon evaluation today patient appears to be doing well currently in regard to her wound is not significantly smaller but it is improved overall. Fortunately I do not see any signs of active infection locally nor systemically at this time. 07-01-2022 upon evaluation today patient appears to be doing okay in regard to her wound I do not believe the silver alginate is helping quite as much as I would like to see. I would actually recommend we switch back over to the [facility] at this point which I think may do better. Fortunately I do not see any signs of infection which is good news were going to switch to Tubigrip on the left leg on the right leg we will get a continue with the compression wrap. 07-08-2022 upon evaluation today patient unfortunately is not doing nearly as well in regard to her left leg. We had to put her back into a 4-layer compression wrap today due to a significant blister that appeared. I do believe that we need to try to get this to reabsorb we will see how things do over the next week. With that being said the right leg is doing well the 4-layer compression wrap and switch to the [facility] seems to be very beneficial I am pleased with what we are seeing in that regard. 11/7; patient's wounds are on the right posterior lateral leg actually measures smaller and look healthy we have been using Hydrofera Blue under 3 layer compression. Last week she had a large blister that was left intact covered with alginate but the leg was wrapped in 3 layer.  Today the left leg had unroofed but the skin still looks reasonably healthy. Nevertheless she is still going to require the left leg to be wrapped 07-22-2022 upon evaluation today patient unfortunately is extremely swollen in regard to her bilateral lower extremities despite the fact that she is currently being compression wrap a 4-layer compression wrap bilaterally. This is unfortunate and the fact that I believe this is not just a lower extremity swelling issue but to be honest this is a much more global issue with edema in regard to her congestive heart failure. Her BNP has been up in the past month and I am not sure where it is as it has been rechecked she sees the heart failure clinic on Thursday in 2 days. With that being said she does tell me that she is having trouble laying down she tells me that she is having trouble walking as well. With all that being said I advised that she probably needs to go to the hospital for further evaluation. 07-29-2022 upon evaluation today patient appears to be doing well currently in regard to her wound on the right leg I am very pleased the left leg is also  doing better though it is weeping quite a bit. I think we need to do something to Help calm this down a bit. She is in agreement with that plan. 08-05-2022 upon evaluation today patient's wounds are showing signs of improvement albeit slowly. Fortunately I do not see any evidence of infection locally nor systemically at this time which is great news. No fevers, chills, nausea, vomiting, or diarrhea. [name], [name] ([id number]) [id number] Page 12 of 14 08-12-2022 upon evaluation today patient actually appears to be doing well currently in regard to her wounds which is showing signs of improvement. Fortunately I do not see any signs of active infection locally nor systemically at this time which is great news. No fevers, chills, nausea, vomiting, or diarrhea. 08-19-2022 upon  evaluation today patient appears to be doing decently well in regard to her wounds. She has been tolerating the dressing changes without complication. Fortunately there does not appear to be any signs of active infection locally nor systemically at this time which is great news. No fevers, chills, nausea, vomiting, or diarrhea. 08-26-2022 upon evaluation today patient appears to be doing decently well in regard to the wounds on her lower extremities. Both are showing signs of improvement which is great news. I do think that she may benefit from switching over to an Unna boot wrap instead of the 3 layer compression wrap just due to the fact that her wraps do seem to be trying to slide a lot more on her unfortunately. The patient is in agreement with that plan. Fortunately I do not see any signs of active infection at this time which is great news and overall I am extremely pleased with where we stand currently. Objective Constitutional Obese and well-hydrated in no acute distress. Vitals Time Taken: 2:25 PM, Height: 69 in, Weight: 244 lbs, BMI: 36, Temperature: 97.8 F, Pulse: 58 bpm, Respiratory Rate: 16 breaths/min, Blood Pressure: 187/104 mmHg. Respiratory normal breathing without difficulty. Psychiatric this patient is able to make decisions and demonstrates good insight into disease process. Alert and Oriented x 3. pleasant and cooperative. General Notes: Upon inspection patient's wound bed actually showed signs of good granulation epithelization at this point. Fortunately I do not see any evidence of infection locally nor systemically which is great news and overall I am extremely pleased with where we stand. No fevers, chills, nausea, vomiting, or diarrhea. Integumentary (Hair, Skin) Wound #4 status is Open. Original cause of wound was Gradually Appeared. The date acquired was: 10/27/2021. The wound has been in treatment 43 weeks. The wound is located on the Right,Lateral Lower Leg. The  wound measures 1.5cm length x 0.6cm width x 0.1cm depth; 0.707cm^2 area and 0.071cm^3 volume. There is Fat Layer (Subcutaneous Tissue) exposed. There is no tunneling noted. There is a medium amount of serosanguineous drainage noted. There is medium (34-66%) pink granulation within the wound bed. There is a medium (34-66%) amount of necrotic tissue within the wound bed including Adherent Slough. Wound #6 status is Open. Original cause of wound was Blister. The date acquired was: 07/09/2022. The wound has been in treatment 5 weeks. The wound is located on the Left,Medial Lower Leg. The wound measures 1.5cm length x 1.5cm width x 0.1cm depth; 1.767cm^2 area and 0.177cm^3 volume. There is no tunneling or undermining noted. There is a medium amount of serosanguineous drainage noted. There is no granulation within the wound bed. There is no necrotic tissue within the wound bed. 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 There was a Three Layer Compression Therapy Procedure by Carlene Coria, RN. Post procedure Diagnosis Wound #4: Same as Pre-Procedure Wound #6 Pre-procedure diagnosis of Wound #6 is a Lymphedema located on the Left,Medial Lower Leg . There was a Three Layer Compression Therapy Procedure by Carlene Coria, RN. Post procedure Diagnosis Wound #6: Same as Pre-Procedure Plan [name], [name] ([id number]) [id number] Page 13 of 14 Follow-up Appointments: Return Appointment in 1 week. Nurse Visit as needed Bathing/ Shower/ Hygiene: May shower with wound dressing protected with water repellent cover or cast protector. No tub bath. Anesthetic  (Use 'Patient Medications' Section for Anesthetic Order Entry): Lidocaine applied to wound bed Edema Control - Lymphedema / Segmental Compressive Device / Other: Optional: One layer of unna paste to top of compression wrap (to act as an anchor). Elevate, Exercise Daily and Avoid Standing for Long Periods of Time. Elevate legs to the level of the heart and pump ankles as often as possible Elevate leg(s) parallel to the floor when sitting. Compression Pump: Use compression pump on left lower extremity for 60 minutes, twice daily. - You may pump over wraps Compression Pump: Use compression pump on right lower extremity for 60 minutes, twice daily. - You may pump over wraps WOUND #4: - Lower Leg Wound Laterality: Right, Lateral Cleanser: Soap and Water 1 x Per Week/30 Days Discharge Instructions: Gently cleanse wound with antibacterial soap, rinse and pat dry prior to dressing wounds Cleanser: Wound Cleanser 1 x Per Week/30 Days Discharge Instructions: Wash your hands with soap and water. Remove old dressing, discard into plastic bag and place into trash. Cleanse the wound with Wound Cleanser prior to applying a clean dressing using gauze sponges, not tissues or cotton balls. Do not scrub or use excessive force. Pat dry using gauze sponges, not tissue or cotton balls. Prim Dressing: Silvercel Small 2x2 (in/in) 1 x Per Week/30 Days ary Discharge Instructions: Apply Silvercel Small 2x2 (in/in) as instructed Com pression Wrap: UNNA BOOT - Unna-Z Zinc and Calamine Boot, 4x10 (in/yd) 1 x Per Week/30 Days Discharge Instructions: Use to anchor wrap in place. Apply one strip around leg 3-finger widths below the knee to secure the wrap. WOUND #6: - Lower Leg Wound Laterality: Left, Medial Cleanser: Soap and Water 1 x Per Week/30 Days Discharge Instructions: Gently cleanse wound with antibacterial soap, rinse and pat dry prior to dressing wounds Cleanser: Wound Cleanser 1 x Per Week/30 Days Discharge  Instructions: Wash your hands with soap and water. Remove old dressing, discard into plastic bag and place into trash. Cleanse the wound with Wound Cleanser prior to applying a clean dressing using gauze sponges, not tissues or cotton balls. Do not scrub or use excessive force. Pat dry using gauze sponges, not tissue or cotton balls. Prim Dressing: Silvercel Small 2x2 (in/in) 1 x Per Week/30 Days ary Discharge Instructions: Apply Silvercel Small 2x2 (in/in) as instructed Com pression Wrap: UNNA BOOT - Unna-Z Zinc and Calamine Boot, 4x10 (in/yd) 1 x Per Week/30 Days Discharge Instructions: Use to anchor wrap in place. Apply one strip around leg 3-finger widths below the knee to secure the wrap. 1. Based on what I am seeing I do believe that the patient is going to continue to show signs of improvement overall I am very  pleased with where we stand and I think that she is moving in the right direction we Argun to make a switch to the Unna boot wrap instead of the 4-layer compression wrap see how this does I think this should do well for her. 2. I am also can recommend that we have the patient continue to monitor for any signs of infection or worsening in general. Obviously if anything changes she knows contact the office and let me know. We will see patient back for reevaluation in 1 week here in the clinic. If anything worsens or changes patient will contact our office for additional recommendations. Electronic Signature(s) Signed: 08/26/2022 2:55:41 PM By: Worthy Keeler PA-C Entered By: Worthy Keeler on 08/26/2022 14:55:41 -------------------------------------------------------------------------------- SuperBill Details Patient Name: Date of Service: [name] 08/26/2022 Medical Record Number: [id number] Patient Account Number: [id number] Date of Birth/Sex: Treating RN: Sep 17, 1945 (76 y.o. [name] Primary Care Provider: [name] Other Clinician: Referring  Provider: Treating Provider/Extender: Mayer Masker, Ngwe Weeks in Treatment: 54 Diagnosis Coding ICD-10 Codes Code Description [name], [name] ([id number]) [id number] Page 14 of 14 I89.0 Lymphedema, not elsewhere classified I87.332 Chronic venous hypertension (idiopathic) with ulcer and inflammation of left lower extremity L97.812 Non-pressure chronic ulcer of other part of right lower leg with fat layer exposed Facility Procedures : CPT4: Code [id number] Description: 81 BILATERAL: Application of multi-layer venous compression system; leg (below knee), including ankle and t. Modifier: Quantity: 1 Physician Procedures : CPT4 Code Description Modifier [id number] - WC PHYS LEVEL 3 - EST PT ICD-10 Diagnosis Description I89.0 Lymphedema, not elsewhere classified I87.332 Chronic venous hypertension (idiopathic) with ulcer and inflammation of left lower extremity L97.812  Non-pressure chronic ulcer of other part of right lower leg with fat layer exposed Quantity: 1 Electronic Signature(s) Signed: 08/26/2022 2:55:58 PM By: Worthy Keeler PA-C Entered By: Worthy Keeler on 08/26/2022 14:55:58

## 2022-08-26 NOTE — Progress Notes (Signed)
KEYA, WYNES ([id number]) [id number] Page 1 of 10 Visit Report for 08/19/2022 Arrival Information Details Patient Name: Date of Service: [name] 08/19/2022 2:15 PM Medical Record Number: [id number] Patient Account Number: [id number] Date of Birth/Sex: Treating RN: March 06, 1946 (76 y.o. [name] Primary Care Octavius Shin: [name] Other Clinician: Massie Kluver Referring Cecilio Ohlrich: Treating Jashay Roddy/Extender: Mayer Masker, Ngwe Weeks in Treatment: 65 Visit Information History Since Last Visit All ordered tests and consults were completed: No Patient Arrived: Wheel Chair Added or deleted any medications: No Arrival Time: 14:43 Any new allergies or adverse reactions: No Transfer Assistance: EasyPivot Patient Lift Had a fall or experienced change in No Patient Identification Verified: Yes activities of daily living that may affect Secondary Verification Process Completed: Yes risk of falls: Patient Requires Transmission-Based Precautions: No Signs or symptoms of abuse/neglect since last visito No Patient Has Alerts: Yes Hospitalized since last visit: No Patient Alerts: NOT DIABETIC Implantable device outside of the clinic excluding No cellular tissue based products placed in the center since last visit: Has Dressing in Place as Prescribed: Yes Has Compression in Place as Prescribed: Yes Pain Present Now: Yes Electronic Signature(s) Signed: 08/26/2022 4:45:28 PM By: Massie Kluver Entered By: Massie Kluver on 08/19/2022 14:44:50 -------------------------------------------------------------------------------- Clinic Level of Care Assessment Details Patient Name: Date of Service: [name] 08/19/2022 2:15 PM Medical Record Number: [id number] Patient Account Number: [id number] Date of Birth/Sex: Treating RN: 1945/12/08 (76 y.o. [name] Primary Care Nazariah Cadet: [name] Other Clinician: Massie Kluver Referring Tabatha Razzano: Treating Neilani Duffee/Extender: Mayer Masker, Ngwe Weeks in Treatment: 33 Clinic Level of Care Assessment Items TOOL 1 Quantity Score '[]'$  - 0 Use when EandM and Procedure is performed on INITIAL visit ASSESSMENTS - Nursing Assessment / Reassessment '[]'$  - 0 General Physical Exam (combine w/ comprehensive assessment (listed just below) when performed on new pt. [address] Sulton, Amellia ([id number]) [id number] Page 2 of 10 '[]'$  - 0 Comprehensive Assessment (HX, ROS, Risk Assessments, Wounds Hx, etc.) ASSESSMENTS - Wound and Skin Assessment / Reassessment '[]'$  - 0 Dermatologic / Skin Assessment (not related to wound area) ASSESSMENTS - Ostomy and/or Continence Assessment and Care '[]'$  - 0 Incontinence Assessment and Management '[]'$  - 0 Ostomy Care Assessment and Management (repouching, etc.) PROCESS - Coordination of Care '[]'$  - 0 Simple Patient / Family Education for ongoing care '[]'$  - 0 Complex (extensive) Patient / Family Education for ongoing care '[]'$  - 0 Staff obtains Programmer, systems, Records, T Results / Process Orders est '[]'$  - 0 Staff telephones HHA, Nursing Homes / Clarify orders / etc '[]'$  - 0 Routine Transfer to another Facility (non-emergent condition) '[]'$  - 0 Routine Hospital Admission (non-emergent condition) '[]'$  - 0 New Admissions / Biomedical engineer / Ordering NPWT Apligraf, etc. , '[]'$  - 0 Emergency Hospital Admission (emergent condition) PROCESS - Special Needs '[]'$  - 0 Pediatric / Minor Patient Management '[]'$  - 0 Isolation Patient Management '[]'$  - 0 Hearing / Language / Visual special needs '[]'$  - 0 Assessment of Community assistance (transportation, D/C planning, etc.) '[]'$  - 0 Additional assistance / Altered mentation '[]'$  - 0 Support Surface(s) Assessment (bed, cushion, seat, etc.) INTERVENTIONS - Miscellaneous '[]'$  - 0 External ear exam '[]'$  - 0 Patient Transfer (multiple staff / Civil Service fast streamer / Similar devices) '[]'$  -  0 Simple Staple / Suture removal (25 or less) '[]'$  - 0 Complex Staple / Suture removal (26 or more) '[]'$  - 0 Hypo/Hyperglycemic Management (do not check if billed separately) '[]'$  - 0 Ankle /  Brachial Index (ABI) - do not check if billed separately Has the patient been seen at the hospital within the last three years: Yes Total Score: 0 Level Of Care: ____ Electronic Signature(s) Signed: 08/26/2022 4:45:28 PM By: Massie Kluver Entered By: Massie Kluver on 08/19/2022 15:13:19 -------------------------------------------------------------------------------- Compression Therapy Details Patient Name: Date of Service: [name] 08/19/2022 2:15 PM Medical Record Number: [id number] Patient Account Number: [id number] Date of Birth/Sex: Treating RN: 15-Jun-1946 (76 y.o. [name] Primary Care Alexy Bringle: [name] Other Clinician: Caliana, Spires, [name] ([id number]) [id number] Page 3 of 10 Referring Avant Printy: Treating Rande Roylance/Extender: Mayer Masker, Ngwe Weeks in Treatment: 23 Compression Therapy Performed for Wound Assessment: Wound #4 Right,Lateral Lower Leg Performed By: Lenice Pressman, Angie, Compression Type: Three Layer Post Procedure Diagnosis Same as Pre-procedure Electronic Signature(s) Signed: 08/26/2022 4:45:28 PM By: Massie Kluver Entered By: Massie Kluver on 08/19/2022 15:12:06 -------------------------------------------------------------------------------- Compression Therapy Details Patient Name: Date of Service: [name] 08/19/2022 2:15 PM Medical Record Number: [id number] Patient Account Number: [id number] Date of Birth/Sex: Treating RN: 01/15/1946 (76 y.o. [name] Primary Care Whyatt Klinger: [name] Other Clinician: Massie Kluver Referring Caroll Cunnington: Treating Keilynn Marano/Extender: Mayer Masker, Ngwe Weeks in Treatment: 17 Compression Therapy Performed for Wound Assessment: Wound #6  Left,Medial Lower Leg Performed By: Lenice Pressman, Angie, Compression Type: Three Layer Post Procedure Diagnosis Same as Pre-procedure Electronic Signature(s) Signed: 08/26/2022 4:45:28 PM By: Massie Kluver Entered By: Massie Kluver on 08/19/2022 15:12:28 -------------------------------------------------------------------------------- Encounter Discharge Information Details Patient Name: Date of Service: [name] 08/19/2022 2:15 PM Medical Record Number: [id number] Patient Account Number: [id number] Date of Birth/Sex: Treating RN: 31-Mar-1946 (76 y.o. [name] Primary Care Brooklyn Jeff: [name] Other Clinician: Massie Kluver Referring Macey Wurtz: Treating Darica Goren/Extender: Alto Denver Weeks in Treatment: 66 Encounter Discharge Information Items Discharge Condition: Stable Ambulatory Status: Wheelchair Discharge Destination: [place], [place] ([id number]) [id number] Page 4 of 10 Transportation: Other Accompanied By: self Schedule Follow-up Appointment: Yes Clinical Summary of Care: Electronic Signature(s) Signed: 08/26/2022 4:45:28 PM By: Massie Kluver Entered By: Massie Kluver on 08/19/2022 16:07:14 -------------------------------------------------------------------------------- Lower Extremity Assessment Details Patient Name: Date of Service: [name] 08/19/2022 2:15 PM Medical Record Number: [id number] Patient Account Number: [id number] Date of Birth/Sex: Treating RN: 1946-08-07 (76 y.o. [name] Primary Care Dontavian Marchi: [name] Other Clinician: Massie Kluver Referring Brandey Vandalen: Treating Tessie Ordaz/Extender: Mayer Masker, Ngwe Weeks in Treatment: 100 Electronic Signature(s) Signed: 08/19/2022 4:34:43 PM By: Carlene Coria RN Signed: 08/26/2022 4:45:28 PM By: Massie Kluver Entered By: Massie Kluver on 08/19/2022  15:00:41 -------------------------------------------------------------------------------- Multi Wound Chart Details Patient Name: Date of Service: [name] 08/19/2022 2:15 PM Medical Record Number: [id number] Patient Account Number: [id number] Date of Birth/Sex: Treating RN: 1945/10/21 (76 y.o. [name] Primary Care Keiaira Donlan: [name] Other Clinician: Massie Kluver Referring Cleatus Goodin: Treating Iman Orourke/Extender: Mayer Masker, Ngwe Weeks in Treatment: 43 Vital Signs Height(in): 69 Pulse(bpm): 70 Weight(lbs): 244 Blood Pressure(mmHg): 110/58 Body Mass Index(BMI): 36 Temperature(F): 98.4 Respiratory Rate(breaths/min): 16 [4:Photos:] [N/A:N/A [id number] Page 5 of 10] Right, Lateral Lower Leg Left, Medial Lower Leg N/A Wound Location: Gradually Appeared Blister N/A Wounding Event: Venous Leg Ulcer Lymphedema N/A Primary Etiology: Cataracts, Lymphedema, Sleep Cataracts, Lymphedema, Sleep N/A Comorbid History: Apnea, Congestive Heart Failure, Apnea, Congestive Heart Failure, Hypertension, Osteoarthritis, Hypertension, Osteoarthritis, Neuropathy, Received Chemotherapy, Neuropathy, Received Chemotherapy, Received Radiation Received Radiation 10/27/2021 07/09/2022 N/A Date Acquired: 34 4 N/A Weeks of Treatment: Open Open N/A Wound  Status: No No N/A Wound Recurrence: 2x2x0.1 7.3x6x0.1 N/A Measurements L x W x D (cm) 3.142 34.4 N/A A (cm) : rea 0.314 3.44 N/A Volume (cm) : -166.70% 60.20% N/A % Reduction in Area: -166.10% 60.20% N/A % Reduction in Volume: Full Thickness Without Exposed Full Thickness Without Exposed N/A Classification: Support Structures Support Structures Medium Medium N/A Exudate Amount: Serosanguineous Serosanguineous N/A Exudate Type: red, brown red, brown N/A Exudate Color: Medium (34-66%) None Present (0%) N/A Granulation Amount: Pink N/A N/A Granulation Quality: Medium (34-66%) None  Present (0%) N/A Necrotic Amount: Fat Layer (Subcutaneous Tissue): Yes Fascia: No N/A Exposed Structures: Fascia: No Fat Layer (Subcutaneous Tissue): No Tendon: No Tendon: No Muscle: No Muscle: No Joint: No Joint: No Bone: No Bone: No None None N/A Epithelialization: Treatment Notes Electronic Signature(s) Signed: 08/26/2022 4:45:28 PM By: Massie Kluver Entered By: Massie Kluver on 08/19/2022 15:00:46 -------------------------------------------------------------------------------- Multi-Disciplinary Care Plan Details Patient Name: Date of Service: [name] 08/19/2022 2:15 PM Medical Record Number: [id number] Patient Account Number: [id number] Date of Birth/Sex: Treating RN: 01-11-1946 (76 y.o. [name] Primary Care Arayah Krouse: [name] Other Clinician: Massie Kluver Referring Crisol Muecke: Treating Smt. Loder/Extender: Mayer Masker, Ngwe Weeks in Treatment: 15 Active Inactive Electronic Signature(s) Signed: 08/19/2022 4:34:43 PM By: Carlene Coria RN Signed: 08/26/2022 4:45:28 PM By: Massie Kluver Entered By: Massie Kluver on 08/19/2022 16:06:22 [name] ([id number]) [id number] Page 6 of 10 -------------------------------------------------------------------------------- Pain Assessment Details Patient Name: Date of Service: [name] 08/19/2022 2:15 PM Medical Record Number: [id number] Patient Account Number: [id number] Date of Birth/Sex: Treating RN: 01/15/1946 (76 y.o. [name] Primary Care Addam Goeller: [name] Other Clinician: Massie Kluver Referring Souleymane Saiki: Treating Anner Baity/Extender: Mayer Masker, Ngwe Weeks in Treatment: 70 Active Problems Location of Pain Severity and Description of Pain Patient Has Paino Yes Site Locations Pain Location: Generalized Pain, Pain in Ulcers Duration of the Pain. Constant / Intermittento Constant Rate the pain. Current Pain Level:  7 Character of Pain Describe the Pain: Throbbing Pain Management and Medication Current Pain Management: Medication: Yes Cold Application: No Rest: Yes Massage: No Activity: No T.E.N.S.: No Heat Application: No Leg drop or elevation: No Is the Current Pain Management Adequate: Inadequate How does your wound impact your activities of daily livingo Sleep: No Bathing: No Appetite: No Relationship With Others: No Bladder Continence: No Emotions: No Bowel Continence: No Work: No Toileting: No Drive: No Dressing: No Hobbies: No Electronic Signature(s) Signed: 08/19/2022 4:34:43 PM By: Carlene Coria RN Signed: 08/26/2022 4:45:28 PM By: Massie Kluver Entered By: Massie Kluver on 08/19/2022 14:47:27 [name] ([id number]) [id number] Page 7 of 10 -------------------------------------------------------------------------------- Patient/Caregiver Education Details Patient Name: Date of Service: [name] 12/12/2023andnbsp2:15 PM Medical Record Number: [id number] Patient Account Number: [id number] Date of Birth/Gender: Treating RN: April 26, 1946 (76 y.o. [name] Primary Care Physician: [name] Other Clinician: Massie Kluver Referring Physician: Treating Physician/Extender: Alto Denver Weeks in Treatment: 4 Education Assessment Education Provided To: Patient Education Topics Provided Wound/Skin Impairment: Handouts: Other: continue wound care as directed Methods: Explain/Verbal Responses: State content correctly Electronic Signature(s) Signed: 08/26/2022 4:45:28 PM By: Massie Kluver Entered By: Massie Kluver on 08/19/2022 16:06:14 -------------------------------------------------------------------------------- Wound Assessment Details Patient Name: Date of Service: [name] 08/19/2022 2:15 PM Medical Record Number: [id number] Patient Account Number: [id number] Date of Birth/Sex: Treating  RN: 1946-08-06 (76 y.o. [name] Primary Care Lachrisha Ziebarth: [name] Other Clinician: Massie Kluver Referring Amir Glaus: Treating Lemma Tetro/Extender: Mayer Masker,  Ngwe Weeks in Treatment: 82 Wound Status Wound Number: 4 Primary Venous Leg Ulcer Etiology: Wound Location: Right, Lateral Lower Leg Wound Open Wounding Event: Gradually Appeared Status: Date Acquired: 10/27/2021 Comorbid Cataracts, Lymphedema, Sleep Apnea, Congestive Heart Failure, Weeks Of Treatment: 42 History: Hypertension, Osteoarthritis, Neuropathy, Received Chemotherapy, Clustered Wound: No Received Radiation Photos Sandoval, [name] ([id number]) [id number] Page 8 of 10 Wound Measurements Length: (cm) 2 Width: (cm) 2 Depth: (cm) 0.1 Area: (cm) 3.142 Volume: (cm) 0.314 % Reduction in Area: -166.7% % Reduction in Volume: -166.1% Epithelialization: None Tunneling: No Undermining: No Wound Description Classification: Full Thickness Without Exposed Support Exudate Amount: Medium Exudate Type: Serosanguineous Exudate Color: red, brown Structures Foul Odor After Cleansing: No Slough/Fibrino Yes Wound Bed Granulation Amount: Medium (34-66%) Exposed Structure Granulation Quality: Pink Fascia Exposed: No Necrotic Amount: Medium (34-66%) Fat Layer (Subcutaneous Tissue) Exposed: Yes Necrotic Quality: Adherent Slough Tendon Exposed: No Muscle Exposed: No Joint Exposed: No Bone Exposed: No Electronic Signature(s) Signed: 08/19/2022 4:34:43 PM By: Carlene Coria RN Signed: 08/26/2022 4:45:28 PM By: Massie Kluver Entered By: Massie Kluver on 08/19/2022 14:59:50 -------------------------------------------------------------------------------- Wound Assessment Details Patient Name: Date of Service: [name] 08/19/2022 2:15 PM Medical Record Number: [id number] Patient Account Number: [id number] Date of Birth/Sex: Treating RN: 04-16-1946 (76 y.o. [name] Primary Care Marquies Wanat: [name] Other Clinician: Massie Kluver Referring Seneca Gadbois: Treating Keyon Winnick/Extender: Mayer Masker, Ngwe Weeks in Treatment: 63 Wound Status Wound Number: 6 Primary Lymphedema Etiology: Wound Location: Left, Medial Lower Leg Wound Open Wounding Event: Blister Status: Date Acquired: 07/09/2022 Comorbid Cataracts, Lymphedema, Sleep Apnea, Congestive Heart Failure, Weeks Of Treatment: 4 History: Hypertension, Osteoarthritis, Neuropathy, Received Chemotherapy, Clustered Wound: No Received Radiation Photos Clark, [name] ([id number]) [id number] Page 9 of 10 Wound Measurements Length: (cm) 7.3 Width: (cm) 6 Depth: (cm) 0.1 Area: (cm) 34.4 Volume: (cm) 3.44 % Reduction in Area: 60.2% % Reduction in Volume: 60.2% Epithelialization: None Tunneling: No Undermining: No Wound Description Classification: Full Thickness Without Exposed Support Structures Exudate Amount: Medium Exudate Type: Serosanguineous Exudate Color: red, brown Foul Odor After Cleansing: No Slough/Fibrino No Wound Bed Granulation Amount: None Present (0%) Exposed Structure Necrotic Amount: None Present (0%) Fascia Exposed: No Fat Layer (Subcutaneous Tissue) Exposed: No Tendon Exposed: No Muscle Exposed: No Joint Exposed: No Bone Exposed: No Electronic Signature(s) Signed: 08/19/2022 4:34:43 PM By: Carlene Coria RN Signed: 08/26/2022 4:45:28 PM By: Massie Kluver Entered By: Massie Kluver on 08/19/2022 15:00:24 -------------------------------------------------------------------------------- Vitals Details Patient Name: Date of Service: [name] 08/19/2022 2:15 PM Medical Record Number: [id number] Patient Account Number: [id number] Date of Birth/Sex: Treating RN: 02-May-1946 (76 y.o. [name] Primary Care Deira Shimer: [name] Other Clinician: Massie Kluver Referring Nakyla Bracco: Treating Zamirah Denny/Extender: Mayer Masker, Ngwe Weeks in Treatment: 5 Vital Signs Time Taken: 14:45 Temperature (F): 98.4 Height (in): 69 Pulse (bpm): 70 Weight (lbs): 244 Respiratory Rate (breaths/min): 16 Body Mass Index (BMI): 36 Blood Pressure (mmHg): 110/58 Reference Range: 80 - 120 mg / dl Electronic Signature(s) Signed: 08/26/2022 4:45:28 PM By: Arthor Captain, Marguarite ([id number]) [id number].pdf Page 10 of 10 Entered By: Massie Kluver on 08/19/2022 14:47:22

## 2022-08-27 NOTE — Telephone Encounter (Signed)
Lmovm to verify if pt is taking losartan 25 mg qd?

## 2022-08-27 NOTE — Progress Notes (Signed)
[name] ([id number]) [id number] Page 1 of 10 Visit Report for 08/26/2022 Arrival Information Details Patient Name: Date of Service: [name] 08/26/2022 2:00 PM Medical Record Number: [id number] Patient Account Number: [id number] Date of Birth/Sex: Treating RN: 1946-05-09 (76 y.o. [name] Primary Care Leilani Cespedes: [name] Other Clinician: Referring Kaidence Callaway: Treating Aurie Harroun/Extender: Mayer Masker, Ngwe Weeks in Treatment: 7 Visit Information History Since Last Visit Added or deleted any medications: No Patient Arrived: Wheel Chair Any new allergies or adverse reactions: No Arrival Time: 14:06 Had a fall or experienced change in No Accompanied By: self activities of daily living that may affect Transfer Assistance: None risk of falls: Patient Identification Verified: Yes Signs or symptoms of abuse/neglect since last visito No Secondary Verification Process Completed: Yes Hospitalized since last visit: No Patient Requires Transmission-Based Precautions: No Implantable device outside of the clinic excluding No Patient Has Alerts: Yes cellular tissue based products placed in the center Patient Alerts: NOT DIABETIC since last visit: Has Dressing in Place as Prescribed: Yes Has Compression in Place as Prescribed: Yes Pain Present Now: No Electronic Signature(s) Signed: 08/27/2022 4:14:17 PM By: Carlene Coria RN Entered By: Carlene Coria on 08/26/2022 14:25:44 -------------------------------------------------------------------------------- Clinic Level of Care Assessment Details Patient Name: Date of Service: [name] 08/26/2022 2:00 PM Medical Record Number: [id number] Patient Account Number: [id number] Date of Birth/Sex: Treating RN: [birth year] (76 y.o. [name] Primary Care Dez Stauffer: [name] Other Clinician: Referring Vitoria Conyer: Treating Nayleah Gamel/Extender: Mayer Masker, Ngwe Weeks in  Treatment: 2 Clinic Level of Care Assessment Items TOOL 1 Quantity Score '[]'$  - 0 Use when EandM and Procedure is performed on INITIAL visit ASSESSMENTS - Nursing Assessment / Reassessment '[]'$  - 0 General Physical Exam (combine w/ comprehensive assessment (listed just below) when performed on new pt. evals) '[]'$  - 0 Comprehensive Assessment (HX, ROS, Risk Assessments, Wounds Hx, etc.) BILLI, BRIGHT ([id number]) [id number] Page 2 of 10 ASSESSMENTS - Wound and Skin Assessment / Reassessment '[]'$  - 0 Dermatologic / Skin Assessment (not related to wound area) ASSESSMENTS - Ostomy and/or Continence Assessment and Care '[]'$  - 0 Incontinence Assessment and Management '[]'$  - 0 Ostomy Care Assessment and Management (repouching, etc.) PROCESS - Coordination of Care '[]'$  - 0 Simple Patient / Family Education for ongoing care '[]'$  - 0 Complex (extensive) Patient / Family Education for ongoing care '[]'$  - 0 Staff obtains Programmer, systems, Records, T Results / Process Orders est '[]'$  - 0 Staff telephones HHA, Nursing Homes / Clarify orders / etc '[]'$  - 0 Routine Transfer to another Facility (non-emergent condition) '[]'$  - 0 Routine Hospital Admission (non-emergent condition) '[]'$  - 0 New Admissions / Biomedical engineer / Ordering NPWT Apligraf, etc. , '[]'$  - 0 Emergency Hospital Admission (emergent condition) PROCESS - Special Needs '[]'$  - 0 Pediatric / Minor Patient Management '[]'$  - 0 Isolation Patient Management '[]'$  - 0 Hearing / Language / Visual special needs '[]'$  - 0 Assessment of Community assistance (transportation, D/C planning, etc.) '[]'$  - 0 Additional assistance / Altered mentation '[]'$  - 0 Support Surface(s) Assessment (bed, cushion, seat, etc.) INTERVENTIONS - Miscellaneous '[]'$  - 0 External ear exam '[]'$  - 0 Patient Transfer (multiple staff / Civil Service fast streamer / Similar devices) '[]'$  - 0 Simple Staple / Suture removal (25 or less) '[]'$  - 0 Complex Staple / Suture removal (26 or  more) '[]'$  - 0 Hypo/Hyperglycemic Management (do not check if billed separately) '[]'$  - 0 Ankle / Brachial Index (ABI) - do not check if billed separately  Has the patient been seen at the hospital within the last three years: Yes Total Score: 0 Level Of Care: ____ Electronic Signature(s) Signed: 08/27/2022 4:14:17 PM By: Carlene Coria RN Entered By: Carlene Coria on 08/26/2022 15:04:46 -------------------------------------------------------------------------------- Compression Therapy Details Patient Name: Date of Service: [name] 08/26/2022 2:00 PM Medical Record Number: [id number] Patient Account Number: [id number] Date of Birth/Sex: Treating RN: 1946-07-05 (76 y.o. [name] Primary Care Hassani Sliney: [name] Other Clinician: Referring Mable Dara: Treating Ysidra Sopher/Extender: [place], [place] ([id number]) [id number] Page 3 of 10 Weeks in Treatment: 83 Compression Therapy Performed for Wound Assessment: Wound #4 Right,Lateral Lower Leg Performed By: Clinician Carlene Coria, RN Compression Type: Three Layer Post Procedure Diagnosis Same as Pre-procedure Electronic Signature(s) Signed: 08/27/2022 4:14:17 PM By: Carlene Coria RN Entered By: Carlene Coria on 08/26/2022 14:38:18 -------------------------------------------------------------------------------- Compression Therapy Details Patient Name: Date of Service: [name] 08/26/2022 2:00 PM Medical Record Number: [id number] Patient Account Number: [id number] Date of Birth/Sex: Treating RN: 1946-07-02 (76 y.o. [name] Primary Care Mckinlee Dunk: [name] Other Clinician: Referring Paden Kuras: Treating Madhavi Hamblen/Extender: Mayer Masker, Ngwe Weeks in Treatment: 50 Compression Therapy Performed for Wound Assessment: Wound #6 Left,Medial Lower Leg Performed By: Clinician Carlene Coria, RN Compression Type: Three Layer Post Procedure Diagnosis Same  as Pre-procedure Electronic Signature(s) Signed: 08/27/2022 4:14:17 PM By: Carlene Coria RN Entered By: Carlene Coria on 08/26/2022 14:38:35 -------------------------------------------------------------------------------- Encounter Discharge Information Details Patient Name: Date of Service: [name] 08/26/2022 2:00 PM Medical Record Number: [id number] Patient Account Number: [id number] Date of Birth/Sex: Treating RN: [birth year] (76 y.o. [name] Primary Care Shanti Agresti: [name] Other Clinician: Referring Jarielys Girardot: Treating Ata Pecha/Extender: [place] Weeks in Treatment: 38 Encounter Discharge Information Items Discharge Condition: Stable Ambulatory Status: Ambulatory Discharge Destination: Home Transportation: [address] [place], [place] ([id number]) [id number] Page 4 of 10 Accompanied By: self Schedule Follow-up Appointment: Yes Clinical Summary of Care: Electronic Signature(s) Signed: 08/27/2022 4:14:17 PM By: Carlene Coria RN Entered By: Carlene Coria on 08/26/2022 14:42:50 -------------------------------------------------------------------------------- Lower Extremity Assessment Details Patient Name: Date of Service: [name] 08/26/2022 2:00 PM Medical Record Number: [id number] Patient Account Number: [id number] Date of Birth/Sex: Treating RN: Mar 24, 1946 (76 y.o. [name] Primary Care Daneesha Quinteros: [name] Other Clinician: Referring Tasheba Henson: Treating Gioia Ranes/Extender: Mayer Masker, Ngwe Weeks in Treatment: 28 Electronic Signature(s) Signed: 08/27/2022 4:14:17 PM By: Carlene Coria RN Entered By: Carlene Coria on 08/26/2022 14:30:11 -------------------------------------------------------------------------------- Multi Wound Chart Details Patient Name: Date of Service: [name] 08/26/2022 2:00 PM Medical Record Number: [id number] Patient Account Number: [id number] Date of  Birth/Sex: Treating RN: 11/19/1945 (76 y.o. [name] Primary Care Paytin Ramakrishnan: [name] Other Clinician: Referring Ra Pfiester: Treating Ashleen Demma/Extender: Mayer Masker, Ngwe Weeks in Treatment: 69 Vital Signs Height(in): 69 Pulse(bpm): 58 Weight(lbs): 244 Blood Pressure(mmHg): 187/104 Body Mass Index(BMI): 36 Temperature(F): 97.8 Respiratory Rate(breaths/min): 16 [4:Photos:] [N/A:N/A [id number] Page 5 of 10] Right, Lateral Lower Leg Left, Medial Lower Leg N/A Wound Location: Gradually Appeared Blister N/A Wounding Event: Venous Leg Ulcer Lymphedema N/A Primary Etiology: Cataracts, Lymphedema, Sleep Cataracts, Lymphedema, Sleep N/A Comorbid History: Apnea, Congestive Heart Failure, Apnea, Congestive Heart Failure, Hypertension, Osteoarthritis, Hypertension, Osteoarthritis, Neuropathy, Received Chemotherapy, Neuropathy, Received Chemotherapy, Received Radiation Received Radiation 10/27/2021 07/09/2022 N/A Date Acquired: 69 5 N/A Weeks of Treatment: Open Open N/A Wound Status: No No N/A Wound Recurrence: 1.5x0.6x0.1 1.5x1.5x0.1 N/A Measurements L x W x D (cm) 0.707 1.767 N/A A (  cm) : rea 0.071 0.177 N/A Volume (cm) : 40.00% 98.00% N/A % Reduction in Area: 39.80% 98.00% N/A % Reduction in Volume: Full Thickness Without Exposed Full Thickness Without Exposed N/A Classification: Support Structures Support Structures Medium Medium N/A Exudate Amount: Serosanguineous Serosanguineous N/A Exudate Type: red, brown red, brown N/A Exudate Color: Medium (34-66%) None Present (0%) N/A Granulation Amount: Pink N/A N/A Granulation Quality: Medium (34-66%) None Present (0%) N/A Necrotic Amount: Fat Layer (Subcutaneous Tissue): Yes Fascia: No N/A Exposed Structures: Fascia: No Fat Layer (Subcutaneous Tissue): No Tendon: No Tendon: No Muscle: No Muscle: No Joint: No Joint: No Bone: No Bone: No None None  N/A Epithelialization: Treatment Notes Electronic Signature(s) Signed: 08/26/2022 2:35:31 PM By: Carlene Coria RN Entered By: Carlene Coria on 08/26/2022 14:35:31 -------------------------------------------------------------------------------- Multi-Disciplinary Care Plan Details Patient Name: Date of Service: [name] 08/26/2022 2:00 PM Medical Record Number: [id number] Patient Account Number: [id number] Date of Birth/Sex: Treating RN: 04/11/1946 (76 y.o. [name] Primary Care Haelee Bolen: [name] Other Clinician: Referring Livian Vanderbeck: Treating Talor Cheema/Extender: Mayer Masker, Ngwe Weeks in Treatment: 40 Active Inactive Electronic Signature(s) Signed: 08/27/2022 4:14:17 PM By: Carlene Coria RN Entered By: Carlene Coria on 08/26/2022 14:40:18 [name] ([id number]) [id number] Page 6 of 10 -------------------------------------------------------------------------------- Pain Assessment Details Patient Name: Date of Service: [name] 08/26/2022 2:00 PM Medical Record Number: [id number] Patient Account Number: [id number] Date of Birth/Sex: Treating RN: [birth year] (76 y.o. [name] Primary Care Kaison Mcparland: [name] Other Clinician: Referring Deontaye Civello: Treating Liandro Thelin/Extender: Mayer Masker, Ngwe Weeks in Treatment: 83 Active Problems Location of Pain Severity and Description of Pain Patient Has Paino No Site Locations Pain Management and Medication Current Pain Management: Electronic Signature(s) Signed: 08/27/2022 4:14:17 PM By: Carlene Coria RN Entered By: Carlene Coria on 08/26/2022 14:26:35 -------------------------------------------------------------------------------- Patient/Caregiver Education Details Patient Name: Date of Service: [name] 12/19/2023andnbsp2:00 PM Medical Record Number: [id number] Patient Account Number: [id number] Date of Birth/Gender: Treating RN: 1946/07/17  (76 y.o. [name] Primary Care Physician: [name] Other Clinician: Referring Physician: Treating Physician/Extender: Mayer Masker, Ngwe Weeks in Treatment: [address], [place] ([id number]) [id number] Page 7 of 10 Education Assessment Education Provided To: Patient Education Topics Provided Wound/Skin Impairment: Methods: Explain/Verbal Responses: State content correctly Electronic Signature(s) Signed: 08/27/2022 4:14:17 PM By: Carlene Coria RN Entered By: Carlene Coria on 08/26/2022 14:40:09 -------------------------------------------------------------------------------- Wound Assessment Details Patient Name: Date of Service: [name] 08/26/2022 2:00 PM Medical Record Number: [id number] Patient Account Number: [id number] Date of Birth/Sex: Treating RN: Jan 08, 1946 (76 y.o. [name] Primary Care Lynann Demetrius: [name] Other Clinician: Referring Jihan Rudy: Treating Amanie Mcculley/Extender: Mayer Masker, Ngwe Weeks in Treatment: 87 Wound Status Wound Number: 4 Primary Venous Leg Ulcer Etiology: Wound Location: Right, Lateral Lower Leg Wound Open Wounding Event: Gradually Appeared Status: Date Acquired: 10/27/2021 Comorbid Cataracts, Lymphedema, Sleep Apnea, Congestive Heart Failure, Weeks Of Treatment: 43 History: Hypertension, Osteoarthritis, Neuropathy, Received Chemotherapy, Clustered Wound: No Received Radiation Photos Wound Measurements Length: (cm) 1.5 Width: (cm) 0.6 Depth: (cm) 0.1 Area: (cm) 0.707 Volume: (cm) 0.071 % Reduction in Area: 40% % Reduction in Volume: 39.8% Epithelialization: None Tunneling: No Wound Description Classification: Full Thickness Without Exposed Support Exudate Amount: Medium Exudate Type: Serosanguineous Exudate Color: red, brown [name], [name] ([id number]) Structures Foul Odor After Cleansing: No Slough/Fibrino Yes [id number] Page 8 of  10 Wound Bed Granulation Amount: Medium (34-66%) Exposed Structure Granulation Quality: Pink Fascia Exposed: No Necrotic Amount: Medium (34-66%) Fat Layer (Subcutaneous Tissue)  Exposed: Yes Necrotic Quality: Adherent Slough Tendon Exposed: No Muscle Exposed: No Joint Exposed: No Bone Exposed: No Treatment Notes Wound #4 (Lower Leg) Wound Laterality: Right, Lateral Cleanser Soap and Water Discharge Instruction: Gently cleanse wound with antibacterial soap, rinse and pat dry prior to dressing wounds Wound Cleanser Discharge Instruction: Wash your hands with soap and water. Remove old dressing, discard into plastic bag and place into trash. Cleanse the wound with Wound Cleanser prior to applying a clean dressing using gauze sponges, not tissues or cotton balls. Do not scrub or use excessive force. Pat dry using gauze sponges, not tissue or cotton balls. Peri-Wound Care Topical Primary Dressing Silvercel Small 2x2 (in/in) Discharge Instruction: Apply Silvercel Small 2x2 (in/in) as instructed Secondary Dressing Secured With Compression Wrap UNNA BOOT - Unna-Z Zinc and Calamine Boot, 4x10 (in/yd) Discharge Instruction: Use to anchor wrap in place. Apply one strip around leg 3-finger widths below the knee to secure the wrap. Compression Stockings Add-Ons Electronic Signature(s) Signed: 08/27/2022 4:14:17 PM By: Carlene Coria RN Entered By: Carlene Coria on 08/26/2022 14:37:36 -------------------------------------------------------------------------------- Wound Assessment Details Patient Name: Date of Service: [name] 08/26/2022 2:00 PM Medical Record Number: [id number] Patient Account Number: [id number] Date of Birth/Sex: Treating RN: 26-Oct-1945 (76 y.o. [name] Primary Care Camyra Vaeth: [name] Other Clinician: Referring Laretha Luepke: Treating Ramesh Moan/Extender: Mayer Masker, Ngwe Weeks in Treatment: 62 Wound Status Wound Number: 6 Primary  Lymphedema Etiology: Wound Location: Left, Medial Lower Leg Wound Open Wounding Event: Blister Status: Date Acquired: 07/09/2022 Comorbid Cataracts, Lymphedema, Sleep Apnea, Congestive Heart Failure, Weeks Of Treatment: 5 History: Hypertension, Osteoarthritis, Neuropathy, Received Chemotherapy, Clustered Wound: No Received Radiation [name], [name] ([id number]) [id number] Page 9 of 10 Photos Wound Measurements Length: (cm) 1.5 Width: (cm) 1.5 Depth: (cm) 0.1 Area: (cm) 1.767 Volume: (cm) 0.177 % Reduction in Area: 98% % Reduction in Volume: 98% Epithelialization: None Tunneling: No Undermining: No Wound Description Classification: Full Thickness Without Exposed Support Structures Exudate Amount: Medium Exudate Type: Serosanguineous Exudate Color: red, brown Foul Odor After Cleansing: No Slough/Fibrino No Wound Bed Granulation Amount: None Present (0%) Exposed Structure Necrotic Amount: None Present (0%) Fascia Exposed: No Fat Layer (Subcutaneous Tissue) Exposed: No Tendon Exposed: No Muscle Exposed: No Joint Exposed: No Bone Exposed: No Treatment Notes Wound #6 (Lower Leg) Wound Laterality: Left, Medial Cleanser Soap and Water Discharge Instruction: Gently cleanse wound with antibacterial soap, rinse and pat dry prior to dressing wounds Wound Cleanser Discharge Instruction: Wash your hands with soap and water. Remove old dressing, discard into plastic bag and place into trash. Cleanse the wound with Wound Cleanser prior to applying a clean dressing using gauze sponges, not tissues or cotton balls. Do not scrub or use excessive force. Pat dry using gauze sponges, not tissue or cotton balls. Peri-Wound Care Topical Primary Dressing Silvercel Small 2x2 (in/in) Discharge Instruction: Apply Silvercel Small 2x2 (in/in) as instructed Secondary Dressing Secured With Compression Wrap UNNA BOOT - Unna-Z Zinc and Calamine Boot, 4x10  (in/yd) Discharge Instruction: Use to anchor wrap in place. Apply one strip around leg 3-finger widths below the knee to secure the wrap. Compression Stockings Add-Ons Electronic Signature(s) Signed: 08/27/2022 4:14:17 PM By: Carlene Coria RN Entered By: Carlene Coria on 08/26/2022 14:29:07 [name] ([id number]) [id number] Page 10 of 10 -------------------------------------------------------------------------------- Vitals Details Patient Name: Date of Service: [name] 08/26/2022 2:00 PM Medical Record Number: [id number] Patient Account Number: [id number] Date of Birth/Sex: Treating RN: July 22, 1946 (76 y.o. [name] Primary Care Leonard Hendler:  [name] Other Clinician: Referring Lowen Mansouri: Treating Genae Strine/Extender: Mayer Masker, Ngwe Weeks in Treatment: 30 Vital Signs Time Taken: 14:25 Temperature (F): 97.8 Height (in): 69 Pulse (bpm): 58 Weight (lbs): 244 Respiratory Rate (breaths/min): 16 Body Mass Index (BMI): 36 Blood Pressure (mmHg): 187/104 Reference Range: 80 - 120 mg / dl Electronic Signature(s) Signed: 08/27/2022 4:14:17 PM By: Carlene Coria RN Entered By: Carlene Coria on 08/26/2022 14:26:21

## 2022-09-02 ENCOUNTER — Encounter: Payer: Medicare Other | Admitting: Physician Assistant

## 2022-09-03 DIAGNOSIS — I890 Lymphedema, not elsewhere classified: Secondary | ICD-10-CM | POA: Diagnosis not present

## 2022-09-03 NOTE — Progress Notes (Signed)
[place], [name] ([id number]) [id number] Page 1 of 2 Visit Report for 09/03/2022 Physician Orders Details Patient Name: Date of Service: [name] 09/03/2022 12:30 PM Medical Record Number: [id number] Patient Account Number: [id number] Date of Birth/Sex: Treating RN: [birth year] (76 y.o. [name] Primary Care Provider: [name] Other Clinician: Referring Provider: Treating Provider/Extender: RO BSO N, MICHA EL G Aycock, Ngwe Weeks in Treatment: 77 Verbal / Phone Orders: No Diagnosis Coding Follow-up Appointments Return Appointment in 1 week. Nurse Visit as needed Bathing/ Shower/ Hygiene May shower with wound dressing protected with water repellent cover or cast protector. No tub bath. Anesthetic (Use 'Patient Medications' Section for Anesthetic Order Entry) Lidocaine applied to wound bed Edema Control - Lymphedema / Segmental Compressive Device / Other Bilateral Lower Extremities Optional: One layer of unna paste to top of compression wrap (to act as an anchor). Elevate, Exercise Daily and A void Standing for Long Periods of Time. Elevate legs to the level of the heart and pump ankles as often as possible Elevate leg(s) parallel to the floor when sitting. Compression Pump: Use compression pump on left lower extremity for 60 minutes, twice daily. - You may pump over wraps Compression Pump: Use compression pump on right lower extremity for 60 minutes, twice daily. - You may pump over wraps Wound Treatment Wound #4 - Lower Leg Wound Laterality: Right, Lateral Cleanser: Soap and Water 1 x Per Week/30 Days Discharge Instructions: Gently cleanse wound with antibacterial soap, rinse and pat dry prior to dressing wounds Cleanser: Wound Cleanser 1 x Per Week/30 Days Discharge Instructions: Wash your hands with soap and water. Remove old dressing, discard into plastic bag and place into trash. Cleanse the wound with Wound Cleanser prior to  applying a clean dressing using gauze sponges, not tissues or cotton balls. Do not scrub or use excessive force. Pat dry using gauze sponges, not tissue or cotton balls. Prim Dressing: Silvercel Small 2x2 (in/in) 1 x Per Week/30 Days ary Discharge Instructions: Apply Silvercel Small 2x2 (in/in) as instructed Compression Wrap: UNNA BOOT - Unna-Z Zinc and Calamine Boot, 4x10 (in/yd) 1 x Per Week/30 Days Discharge Instructions: Use to anchor wrap in place. Apply one strip around leg 3-finger widths below the knee to secure the wrap. Wound #6 - Lower Leg Wound Laterality: Left, Medial Cleanser: Soap and Water 1 x Per Week/30 Days Discharge Instructions: Gently cleanse wound with antibacterial soap, rinse and pat dry prior to dressing wounds Cleanser: Wound Cleanser 1 x Per Week/30 Days Discharge Instructions: Wash your hands with soap and water. Remove old dressing, discard into plastic bag and place into trash. Cleanse the wound with Wound Cleanser prior to applying a clean dressing using gauze sponges, not tissues or cotton balls. Do not scrub or use excessive force. Pat dry using gauze sponges, not tissue or cotton balls. Prim Dressing: Silvercel Small 2x2 (in/in) 1 x Per Week/30 Days ary Discharge Instructions: Apply Silvercel Small 2x2 (in/in) as instructed Compression Wrap: UNNA BOOT - Unna-Z Zinc and Calamine Boot, 4x10 (in/yd) 1 x Per Week/30 Days Discharge Instructions: Use to anchor wrap in place. Apply one strip around leg 3-finger widths below the knee to secure the wrap. [place], [name] ([id number]) [id number] Page 2 of 2 Electronic Signature(s) Signed: 09/03/2022 1:32:55 PM By: Carlene Coria RN Signed: 09/03/2022 4:44:55 PM By: Linton Ham MD Entered By: Carlene Coria on 09/03/2022 13:32:55 -------------------------------------------------------------------------------- SuperBill Details Patient Name: Date of Service: [name]  09/03/2022 Medical Record Number: [id number] Patient Account  Number: [id number] Date of Birth/Sex: Treating RN: [birth year] (76 y.o. 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 Modifier: Quantity: 1 Electronic Signature(s) Signed: 09/03/2022 1:33:44 PM By: Carlene Coria RN Signed: 09/03/2022 4:44:55 PM By: Linton Ham MD Entered By: Carlene Coria on 09/03/2022 13:33:44

## 2022-09-04 NOTE — Progress Notes (Signed)
[place], [name] ([id number]) [id number] Page 1 of 6 Visit Report for 09/03/2022 Arrival Information Details Patient Name: Date of Service: [name] 09/03/2022 12:30 PM Medical Record Number: [id number] Patient Account Number: [id number] Date of Birth/Sex: Treating RN: 14-Nov-1945 (76 y.o. [name] Primary Care Tashai Catino: [name] Other Clinician: Referring Hiba Garry: Treating Ciearra Rufo/Extender: RO BSO N, MICHA EL G Aycock, Ngwe Weeks in Treatment: 77 Visit Information History Since Last Visit Added or deleted any medications: No Patient Arrived: Wheel Chair Any new allergies or adverse reactions: No Arrival Time: 12:35 Had a fall or experienced change in No Accompanied By: self activities of daily living that may affect Transfer Assistance: None risk of falls: Patient Identification Verified: Yes Signs or symptoms of abuse/neglect since last visito No Secondary Verification Process Completed: Yes Hospitalized since last visit: No Patient Requires Transmission-Based Precautions: No Implantable device outside of the clinic excluding No Patient Has Alerts: Yes cellular tissue based products placed in the center Patient Alerts: NOT DIABETIC since last visit: Has Dressing in Place as Prescribed: Yes Pain Present Now: No Electronic Signature(s) Signed: 09/04/2022 9:37:21 AM By: Carlene Coria RN Entered By: Carlene Coria on 09/03/2022 13:18:34 -------------------------------------------------------------------------------- Clinic Level of Care Assessment Details Patient Name: Date of Service: [name] 09/03/2022 12:30 PM Medical Record Number: [id number] Patient Account Number: [id number] Date of Birth/Sex: Treating RN: 19-Oct-1945 (76 y.o. [name] Primary Care [name]: [name] Other Clinician: Referring [name]: Treating [name]/Extender: RO BSO N, MICHA EL G Aycock, Ngwe Weeks in Treatment: 40 Clinic  Level of Care Assessment Items TOOL 1 Quantity Score '[]'$  - 0 Use when EandM and Procedure is performed on INITIAL visit ASSESSMENTS - Nursing Assessment / Reassessment '[]'$  - 0 General Physical Exam (combine w/ comprehensive assessment (listed just below) when performed on new pt. evals) '[]'$  - 0 Comprehensive Assessment (HX, ROS, Risk Assessments, Wounds Hx, etc.) LILEY, RAKE ([id number]) [id number] Page 2 of 6 ASSESSMENTS - Wound and Skin Assessment / Reassessment '[]'$  - 0 Dermatologic / Skin Assessment (not related to wound area) ASSESSMENTS - Ostomy and/or Continence Assessment and Care '[]'$  - 0 Incontinence Assessment and Management '[]'$  - 0 Ostomy Care Assessment and Management (repouching, etc.) PROCESS - Coordination of Care '[]'$  - 0 Simple Patient / Family Education for ongoing care '[]'$  - 0 Complex (extensive) Patient / Family Education for ongoing care '[]'$  - 0 Staff obtains Programmer, systems, Records, T Results / Process Orders est '[]'$  - 0 Staff telephones HHA, Nursing Homes / Clarify orders / etc '[]'$  - 0 Routine Transfer to another Facility (non-emergent condition) '[]'$  - 0 Routine Hospital Admission (non-emergent condition) '[]'$  - 0 New Admissions / Biomedical engineer / Ordering NPWT Apligraf, etc. , '[]'$  - 0 Emergency Hospital Admission (emergent condition) PROCESS - Special Needs '[]'$  - 0 Pediatric / Minor Patient Management '[]'$  - 0 Isolation Patient Management '[]'$  - 0 Hearing / Language / Visual special needs '[]'$  - 0 Assessment of Community assistance (transportation, D/C planning, etc.) '[]'$  - 0 Additional assistance / Altered mentation '[]'$  - 0 Support Surface(s) Assessment (bed, cushion, seat, etc.) INTERVENTIONS - Miscellaneous '[]'$  - 0 External ear exam '[]'$  - 0 Patient Transfer (multiple staff / Civil Service fast streamer / Similar devices) '[]'$  - 0 Simple Staple / Suture removal (25 or less) '[]'$  - 0 Complex Staple / Suture removal (26 or more) '[]'$  -  0 Hypo/Hyperglycemic Management (do not check if billed separately) '[]'$  - 0 Ankle / Brachial Index (ABI) - do not check if billed  separately Has the patient been seen at the hospital within the last three years: Yes Total Score: 0 Level Of Care: ____ Electronic Signature(s) Signed: 09/04/2022 9:37:21 AM By: Carlene Coria RN Entered By: Carlene Coria on 09/03/2022 13:33:32 -------------------------------------------------------------------------------- Compression Therapy Details Patient Name: Date of Service: [name] 09/03/2022 12:30 PM Medical Record Number: [id number] Patient Account Number: [id number] Date of Birth/Sex: Treating RN: 1946-04-25 (76 y.o. [name] Primary Care Aleza Pew: [name] Other Clinician: Referring Kasir Hallenbeck: Treating Genesis Novosad/Extender: RO BSO N, MICHA EL Havery Moros, Ngwe Weeks in Treatment: [address], [place] ([id number]) [id number] Page 3 of 6 Compression Therapy Performed for Wound Assessment: Wound #4 Right,Lateral Lower Leg Performed By: Clinician Carlene Coria, RN Compression Type: Rolena Infante Electronic Signature(s) Signed: 09/03/2022 1:31:09 PM By: Carlene Coria RN Entered By: Carlene Coria on 09/03/2022 13:31:09 -------------------------------------------------------------------------------- Compression Therapy Details Patient Name: Date of Service: [name] 09/03/2022 12:30 PM Medical Record Number: [id number] Patient Account Number: [id number] Date of Birth/Sex: Treating RN: [birth year] (76 y.o. [name] Primary Care Denese Mentink: [name] Other Clinician: Referring Kurt Hoffmeier: Treating Darroll Bredeson/Extender: RO BSO N, MICHA EL G Aycock, Ngwe Weeks in Treatment: 85 Compression Therapy Performed for Wound Assessment: Wound #6 Left,Medial Lower Leg Performed By: Clinician Carlene Coria, RN Compression Type: Rolena Infante Electronic Signature(s) Signed: 09/03/2022 1:31:24 PM By: Carlene Coria  RN Entered By: Carlene Coria on 09/03/2022 13:31:24 -------------------------------------------------------------------------------- Encounter Discharge Information Details Patient Name: Date of Service: [name] 09/03/2022 12:30 PM Medical Record Number: [id number] Patient Account Number: [id number] Date of Birth/Sex: Treating RN: 1946-03-17 (76 y.o. [name] Primary Care Demoni Parmar: [name] Other Clinician: Referring Ferd Horrigan: Treating Shiasia Porro/Extender: RO BSO N, MICHA EL G Aycock, Ngwe Weeks in Treatment: 87 Encounter Discharge Information Items Discharge Condition: Stable Ambulatory Status: Wheelchair Discharge Destination: Home Transportation: Private Auto Accompanied By: self Schedule Follow-up Appointment: Yes Clinical Summary of Care: Electronic Signature(s) Signed: 09/03/2022 1:33:22 PM By: Carlene Coria RN Entered By: Carlene Coria on 09/03/2022 13:33:22 [name] ([id number]) [id number] Page 4 of 6 -------------------------------------------------------------------------------- Wound Assessment Details Patient Name: Date of Service: [name] 09/03/2022 12:30 PM Medical Record Number: [id number] Patient Account Number: [id number] Date of Birth/Sex: Treating RN: [birth year] (76 y.o. [name] Primary Care Farley Crooker: [name] Other Clinician: Referring Isebella Upshur: Treating Kym Fenter/Extender: RO BSO N, MICHA EL G Aycock, Ngwe Weeks in Treatment: 85 Wound Status Wound Number: 4 Primary Venous Leg Ulcer Etiology: Wound Location: Right, Lateral Lower Leg Wound Open Wounding Event: Gradually Appeared Status: Date Acquired: 10/27/2021 Comorbid Cataracts, Lymphedema, Sleep Apnea, Congestive Heart Failure, Weeks Of Treatment: 44 History: Hypertension, Osteoarthritis, Neuropathy, Received Clustered Wound: No Chemotherapy, Received Radiation Wound Measurements Length: (cm) 1.5 Width: (cm) 0.6 Depth:  (cm) 0.1 Area: (cm) 0.707 Volume: (cm) 0.071 % Reduction in Area: 40% % Reduction in Volume: 39.8% Epithelialization: None Tunneling: No Undermining: No Wound Description Classification: Full Thickness Without Exposed Support Exudate Amount: Medium Exudate Type: Serosanguineous Exudate Color: red, brown Structures Foul Odor After Cleansing: No Slough/Fibrino Yes Wound Bed Granulation Amount: Medium (34-66%) Exposed Structure Granulation Quality: Pink Fascia Exposed: No Necrotic Amount: Medium (34-66%) Fat Layer (Subcutaneous Tissue) Exposed: Yes Necrotic Quality: Adherent Slough Tendon Exposed: No Muscle Exposed: No Joint Exposed: No Bone Exposed: No Treatment Notes Wound #4 (Lower Leg) Wound Laterality: Right, Lateral Cleanser Soap and Water Discharge Instruction: Gently cleanse wound with antibacterial soap, rinse and pat dry prior to dressing wounds Wound Cleanser Discharge Instruction: Wash your hands with  soap and water. Remove old dressing, discard into plastic bag and place into trash. Cleanse the wound with Wound Cleanser prior to applying a clean dressing using gauze sponges, not tissues or cotton balls. Do not scrub or use excessive force. Pat dry using gauze sponges, not tissue or cotton balls. Peri-Wound Care Topical Primary Dressing Silvercel Small 2x2 (in/in) Discharge Instruction: Apply Silvercel Small 2x2 (in/in) as instructed Secondary Dressing LAKIA, GRITTON ([id number]) [id number] Page 5 of 6 Secured With Compression Wrap UNNA BOOT - Unna-Z Zinc and Calamine Boot, 4x10 (in/yd) Discharge Instruction: Use to anchor wrap in place. Apply one strip around leg 3-finger widths below the knee to secure the wrap. Compression Stockings Add-Ons Electronic Signature(s) Signed: 09/03/2022 1:30:23 PM By: Carlene Coria RN Entered By: Carlene Coria on 09/03/2022  13:30:23 -------------------------------------------------------------------------------- Wound Assessment Details Patient Name: Date of Service: [name] 09/03/2022 12:30 PM Medical Record Number: [id number] Patient Account Number: [id number] Date of Birth/Sex: Treating RN: 05-23-1946 (76 y.o. [name] Primary Care Killian Ress: [name] Other Clinician: Referring Kimoni Pagliarulo: Treating Adisyn Ruscitti/Extender: RO BSO N, MICHA EL G Aycock, Ngwe Weeks in Treatment: 22 Wound Status Wound Number: 6 Primary Lymphedema Etiology: Wound Location: Left, Medial Lower Leg Wound Open Wounding Event: Blister Status: Date Acquired: 07/09/2022 Comorbid Cataracts, Lymphedema, Sleep Apnea, Congestive Heart Failure, Weeks Of Treatment: 6 History: Hypertension, Osteoarthritis, Neuropathy, Received Clustered Wound: No Chemotherapy, Received Radiation Wound Measurements Length: (cm) 1.5 Width: (cm) 1.5 Depth: (cm) 0.1 Area: (cm) 1.767 Volume: (cm) 0.177 % Reduction in Area: 98% % Reduction in Volume: 98% Epithelialization: None Tunneling: No Undermining: No Wound Description Classification: Full Thickness Without Exposed Support Structures Exudate Amount: Medium Exudate Type: Serosanguineous Exudate Color: red, brown Foul Odor After Cleansing: No Slough/Fibrino Yes Wound Bed Granulation Amount: Medium (34-66%) Exposed Structure Granulation Quality: Pink Fascia Exposed: No Necrotic Amount: Medium (34-66%) Fat Layer (Subcutaneous Tissue) Exposed: No Necrotic Quality: Adherent Slough Tendon Exposed: No Muscle Exposed: No Joint Exposed: No Bone Exposed: No Treatment Notes Wound #6 (Lower Leg) Wound Laterality: Left, Medial Cleanser Soap and Water Discharge Instruction: Gently cleanse wound with antibacterial soap, rinse and pat dry prior to dressing wounds [place], [name] ([id number]) [id number] Page 6 of 6 Wound Cleanser Discharge  Instruction: Wash your hands with soap and water. Remove old dressing, discard into plastic bag and place into trash. Cleanse the wound with Wound Cleanser prior to applying a clean dressing using gauze sponges, not tissues or cotton balls. Do not scrub or use excessive force. Pat dry using gauze sponges, not tissue or cotton balls. Peri-Wound Care Topical Primary Dressing Silvercel Small 2x2 (in/in) Discharge Instruction: Apply Silvercel Small 2x2 (in/in) as instructed Secondary Dressing Secured With Compression Wrap UNNA BOOT - Unna-Z Zinc and Calamine Boot, 4x10 (in/yd) Discharge Instruction: Use to anchor wrap in place. Apply one strip around leg 3-finger widths below the knee to secure the wrap. Compression Stockings Add-Ons Electronic Signature(s) Signed: 09/03/2022 1:30:51 PM By: Carlene Coria RN Entered By: Carlene Coria on 09/03/2022 13:30:51

## 2022-09-08 NOTE — Progress Notes (Deleted)
Patient ID: [name], female    DOB: 1946-01-23, 77 y.o.   MRN: [id number]  HPI  [name] is a 77 y/o female with a history of breast cancer, CKD, HTN, GERD, sleep apnea, previous tobacco use and chronic heart failure.   Echo report from 03/04/22 reviewed and showed an EF of 50-55% along with mild LVH/ LAE and mild MR.   Was in the ED 08/22/22 due to near syncope. Admitted 06/12/22 due to acute on chronic CHF with volume overload. Initially given IV lasix with transition to oral diuretics. HTN emergency and meds adjusted. Elevated troponin thought to be due to demand ischemia. Weaned off oxygen to room air. Discharged after 3 days.   She presents today for a follow-up visit with a chief complaint of     Goes to the wound center for her lymphedema and has her legs wrapped 1-2 times/ week.   Past Medical History:  Diagnosis Date   Abnormality of gait    Acute respiratory failure with hypoxia (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    Breast cancer ([place] [facility]) 2019   right breast   CHF (congestive heart failure) ([place])    March 28, 2017 echo: EF 50-55%; 1) echo 07/2011: EF 20-25%, mild concentric hypertrophy, diffuse HK, regional WMA cannot be excluded, grade 1 DD, mitral valve with mild regurg, LA mildly dilated). 2) EF 40-45%, mild AS,                     Chronic kidney disease    Complication of anesthesia 2006   pt states "hard to wake up" after L breast surgery    Edema    GERD (gastroesophageal reflux disease)    Headache    Heart murmur    Hypertension    Hypokinesis    global, EF 35-45 % from Echo.   Malignant neoplasm of breast (female), unspecified site 06/14/2018   RIGHT T1b, N0; ER/PR positive, HER-2 negative.   Neuropathy    Obesity    Pain in joint, site unspecified    Personal history of chemotherapy 2006   left breast   Personal  history of radiation therapy 2019   right breast   Personal history of radiation therapy 2006   left breast   Pneumonia    Sleep apnea    Tinea pedis    Unspecified hearing loss    bilateral   Past Surgical History:  Procedure Laterality Date   BREAST BIOPSY Right 2019   11 oc-INVASIVE MAMMARY CARCINOMA, NO SPECIAL TYPE   BREAST BIOPSY Right 2019   1030 oc- neg   BREAST LUMPECTOMY  2006   left breast    CATARACT EXTRACTION     left eye   HERNIA REPAIR  05/25/2006   Incarcerated omentum in ventral hernia, Ventralight mesh, combined lap/ open with omental resection.    PARTIAL MASTECTOMY WITH NEEDLE LOCALIZATION Right 06/14/2018   Procedure: PARTIAL MASTECTOMY WITH NEEDLE LOCALIZATION;  Surgeon: Robert Bellow, MD;  Location: [facility] ORS;  Service: General;  Laterality: Right;   PORT-A-CATH REMOVAL  05/25/2006   SENTINEL NODE BIOPSY Right 06/14/2018   Procedure: SENTINEL NODE BIOPSY;  Surgeon: Robert Bellow, MD;  Location: [facility] ORS;  Service: General;  Laterality: Right;   TUBAL LIGATION     Family History  Problem Relation Age of Onset   Hypertension Mother  deceased 65   Cataracts Mother    Breast cancer Other 85       maternal half-sister; deceased 13   Coronary artery disease Other    Social History   Tobacco Use   Smoking status: Former    Packs/day: 0.50    Years: 30.00    Total pack years: 15.00    Types: Cigarettes    Quit date: 07/14/2000    Years since quitting: 22.1   Smokeless tobacco: Never  Substance Use Topics   Alcohol use: No   No Known Allergies    Review of Systems  Constitutional:  Positive for appetite change (decreased) and fatigue.  HENT:  Negative for congestion, postnasal drip and sore throat.   Eyes:  Positive for visual disturbance (cataracts).  Respiratory:  Positive for cough and shortness of breath. Negative for wheezing.   Cardiovascular:  Positive for leg swelling. Negative for chest pain and palpitations.   Gastrointestinal:  Negative for abdominal distention and abdominal pain.  Endocrine: Negative.   Genitourinary: Negative.   Musculoskeletal:  Negative for back pain and neck pain.  Skin:  Positive for wound (on both legs).  Allergic/Immunologic: Negative.   Neurological:  Positive for dizziness and light-headedness.  Hematological:  Negative for adenopathy. Does not bruise/bleed easily.  Psychiatric/Behavioral:  Positive for sleep disturbance (chronic difficulty sleeping; sleeping on 2 pillows). Negative for dysphoric mood. The patient is nervous/anxious.      Physical Exam Vitals and nursing note reviewed. Exam conducted with a chaperone present (daughter).  Constitutional:      Appearance: Normal appearance.  HENT:     Head: Normocephalic and atraumatic.  Cardiovascular:     Rate and Rhythm: Normal rate and regular rhythm.  Pulmonary:     Effort: Pulmonary effort is normal. No respiratory distress.     Breath sounds: No wheezing or rales.  Abdominal:     General: There is no distension.     Palpations: Abdomen is soft.     Tenderness: There is no abdominal tenderness.  Musculoskeletal:        General: No tenderness.     Cervical back: Normal range of motion and neck supple.     Right lower leg: Edema (wrapped in [employer]) present.     Left lower leg: Edema (wrapped in [employer]) present.  Skin:    General: Skin is warm and dry.  Neurological:     General: No focal deficit present.     Mental Status: She is alert and oriented to person, place, and time.  Psychiatric:        Mood and Affect: Mood is anxious. Mood is not depressed.    Assessment & Plan:  1: Chronic heart failure with preserved ejection fraction with structural changes (LVH/LAE)- - NYHA class III - euvolemic today - weighing when she can safely weigh; reminded to call for an overnight weight gain of > 2 pounds or a weekly weight gain of > 5 pounds - weight 243 pounds from last visit here 1 month ago -  not adding salt  - unclear of fluid intake - on GDMT of losartan & jardiance - BNP 06/13/22 was 367.7  2: HTN with CKD- - BP - saw PCP ([name]) - BMP 08/23/22 reviewed and showed sodium 141, potassium 4.3, creatinine 1.61 and GFR 33  3: OSA- - last sleep study was done 5 years ago and she's needing new supplies so that she can start wearing it  4: Breast cancer- - saw  oncology Janese Banks) 07/21/22 - currently taking arimidex  5: Lymphedema- - stage 2 - encouraged her to elevate her legs when sitting for long periods of time - legs are currently wrapped in UNNA boots - went to wound center 08/26/22 - limited in her ability to exercise - consider compression boots once she no longer needs UNNA boots   Patient did not bring her medications nor a list. Each medication was verbally reviewed with the patient and she was encouraged to bring the bottles to every visit to confirm accuracy of list.

## 2022-09-09 ENCOUNTER — Other Ambulatory Visit: Payer: Self-pay

## 2022-09-09 ENCOUNTER — Emergency Department: Payer: Medicare Other

## 2022-09-09 ENCOUNTER — Encounter: Payer: Medicare Other | Admitting: Family

## 2022-09-09 ENCOUNTER — Ambulatory Visit: Payer: Medicare Other | Admitting: Physician Assistant

## 2022-09-09 ENCOUNTER — Telehealth: Payer: Self-pay | Admitting: Family

## 2022-09-09 ENCOUNTER — Encounter: Payer: Self-pay | Admitting: Emergency Medicine

## 2022-09-09 DIAGNOSIS — E7439 Other disorders of intestinal carbohydrate absorption: Secondary | ICD-10-CM | POA: Diagnosis present

## 2022-09-09 DIAGNOSIS — L03115 Cellulitis of right lower limb: Secondary | ICD-10-CM | POA: Diagnosis present

## 2022-09-09 DIAGNOSIS — Z7982 Long term (current) use of aspirin: Secondary | ICD-10-CM

## 2022-09-09 DIAGNOSIS — I5022 Chronic systolic (congestive) heart failure: Secondary | ICD-10-CM | POA: Diagnosis present

## 2022-09-09 DIAGNOSIS — I130 Hypertensive heart and chronic kidney disease with heart failure and stage 1 through stage 4 chronic kidney disease, or unspecified chronic kidney disease: Secondary | ICD-10-CM | POA: Diagnosis present

## 2022-09-09 DIAGNOSIS — M19071 Primary osteoarthritis, right ankle and foot: Secondary | ICD-10-CM | POA: Diagnosis present

## 2022-09-09 DIAGNOSIS — K5900 Constipation, unspecified: Secondary | ICD-10-CM | POA: Diagnosis present

## 2022-09-09 DIAGNOSIS — Z79899 Other long term (current) drug therapy: Secondary | ICD-10-CM

## 2022-09-09 DIAGNOSIS — C50911 Malignant neoplasm of unspecified site of right female breast: Secondary | ICD-10-CM | POA: Diagnosis present

## 2022-09-09 DIAGNOSIS — M19072 Primary osteoarthritis, left ankle and foot: Secondary | ICD-10-CM | POA: Diagnosis present

## 2022-09-09 DIAGNOSIS — G8929 Other chronic pain: Secondary | ICD-10-CM | POA: Diagnosis present

## 2022-09-09 DIAGNOSIS — Z171 Estrogen receptor negative status [ER-]: Secondary | ICD-10-CM

## 2022-09-09 DIAGNOSIS — I739 Peripheral vascular disease, unspecified: Secondary | ICD-10-CM | POA: Diagnosis present

## 2022-09-09 DIAGNOSIS — E669 Obesity, unspecified: Secondary | ICD-10-CM | POA: Diagnosis present

## 2022-09-09 DIAGNOSIS — K219 Gastro-esophageal reflux disease without esophagitis: Secondary | ICD-10-CM | POA: Diagnosis present

## 2022-09-09 DIAGNOSIS — M170 Bilateral primary osteoarthritis of knee: Secondary | ICD-10-CM | POA: Diagnosis present

## 2022-09-09 DIAGNOSIS — Z87891 Personal history of nicotine dependence: Secondary | ICD-10-CM

## 2022-09-09 DIAGNOSIS — Z8249 Family history of ischemic heart disease and other diseases of the circulatory system: Secondary | ICD-10-CM

## 2022-09-09 DIAGNOSIS — Z9221 Personal history of antineoplastic chemotherapy: Secondary | ICD-10-CM

## 2022-09-09 DIAGNOSIS — Y92009 Unspecified place in unspecified non-institutional (private) residence as the place of occurrence of the external cause: Secondary | ICD-10-CM

## 2022-09-09 DIAGNOSIS — Z1152 Encounter for screening for COVID-19: Secondary | ICD-10-CM

## 2022-09-09 DIAGNOSIS — W19XXXA Unspecified fall, initial encounter: Secondary | ICD-10-CM | POA: Diagnosis present

## 2022-09-09 DIAGNOSIS — N281 Cyst of kidney, acquired: Secondary | ICD-10-CM | POA: Diagnosis present

## 2022-09-09 DIAGNOSIS — Z853 Personal history of malignant neoplasm of breast: Secondary | ICD-10-CM

## 2022-09-09 DIAGNOSIS — Z7984 Long term (current) use of oral hypoglycemic drugs: Secondary | ICD-10-CM

## 2022-09-09 DIAGNOSIS — L03116 Cellulitis of left lower limb: Secondary | ICD-10-CM | POA: Diagnosis not present

## 2022-09-09 DIAGNOSIS — G4733 Obstructive sleep apnea (adult) (pediatric): Secondary | ICD-10-CM | POA: Diagnosis present

## 2022-09-09 DIAGNOSIS — Z6835 Body mass index (BMI) 35.0-35.9, adult: Secondary | ICD-10-CM

## 2022-09-09 DIAGNOSIS — N179 Acute kidney failure, unspecified: Secondary | ICD-10-CM | POA: Diagnosis present

## 2022-09-09 DIAGNOSIS — Z170 Estrogen receptor positive status [ER+]: Secondary | ICD-10-CM

## 2022-09-09 DIAGNOSIS — N1832 Chronic kidney disease, stage 3b: Secondary | ICD-10-CM | POA: Diagnosis present

## 2022-09-09 DIAGNOSIS — R7303 Prediabetes: Secondary | ICD-10-CM | POA: Diagnosis present

## 2022-09-09 DIAGNOSIS — R531 Weakness: Secondary | ICD-10-CM | POA: Diagnosis not present

## 2022-09-09 DIAGNOSIS — Z923 Personal history of irradiation: Secondary | ICD-10-CM

## 2022-09-09 DIAGNOSIS — Z803 Family history of malignant neoplasm of breast: Secondary | ICD-10-CM

## 2022-09-09 DIAGNOSIS — Z79811 Long term (current) use of aromatase inhibitors: Secondary | ICD-10-CM

## 2022-09-09 LAB — RESP PANEL BY RT-PCR (RSV, FLU A&B, COVID)  RVPGX2
Influenza A by PCR: NEGATIVE
Influenza B by PCR: NEGATIVE
Resp Syncytial Virus by PCR: NEGATIVE
SARS Coronavirus 2 by RT PCR: NEGATIVE

## 2022-09-09 LAB — TROPONIN I (HIGH SENSITIVITY): Troponin I (High Sensitivity): 33 ng/L — ABNORMAL HIGH (ref <18)

## 2022-09-09 NOTE — ED Provider Triage Note (Signed)
  Emergency Medicine Provider Triage Evaluation Note  [name], a 77 y.o. female  was evaluated in triage.  Pt complains of chronic BLE pain and edema. Patient with a history of CHF, HTN, PAD, lymphedema, and morbid obesity presents for knee pain due to falls. She notes her "legs just gave out!"  Unna boots in place bilaterally.  Review of Systems  Positive: Chronic BLE pain  Negative: FCS. SOB  Physical Exam  BP (!) 157/78 (BP Location: Left Arm)   Pulse 94   Temp 97.8 F (36.6 C) (Oral)   Resp 16   Ht '5\' 9"'$  (1.753 m)   Wt 110.2 kg   SpO2 93%   BMI 35.88 kg/m  Gen:   Awake, no distress  NAd Resp:  Normal effort  MSK:   Moves extremities without difficulty BLE edema noted. Unna boots in place Other:    Medical Decision Making  Medically screening exam initiated at 8:45 PM.  Appropriate orders placed.  [name] was informed that the remainder of the evaluation will be completed by another provider, this initial triage assessment does not replace that evaluation, and the importance of remaining in the ED until their evaluation is complete.  Geriatric patient to the ED for evaluation of generalized leg weakness, chronic edema, and falls due to her leg pain.    Melvenia Needles, PA-C 09/09/22 2054

## 2022-09-09 NOTE — Telephone Encounter (Signed)
Patient did not show for her Heart Failure Clinic appointment on 09/09/22. Will attempt to reschedule.

## 2022-09-09 NOTE — ED Triage Notes (Signed)
Pt to ED via EMS from home c/o bilateral leg pain "for a while", states came in tonight though because falling at home, denies hitting head.  State uses a walker at home and legs buckled underneath her.  Denies weakness or dizziness.  Pain in legs is sharp.  Pt A&Ox4, legs are wrapped and sees wound clinic, chest rise even and unlabored, in NAD at this time.

## 2022-09-10 ENCOUNTER — Emergency Department: Payer: Medicare Other

## 2022-09-10 ENCOUNTER — Inpatient Hospital Stay
Admission: EM | Admit: 2022-09-10 | Discharge: 2022-09-16 | DRG: 603 | Disposition: A | Discharge status: Skilled Nursing Facility | Payer: Medicare Other | Attending: Student | Admitting: Student

## 2022-09-10 DIAGNOSIS — Z79899 Other long term (current) drug therapy: Secondary | ICD-10-CM | POA: Diagnosis not present

## 2022-09-10 DIAGNOSIS — N1832 Chronic kidney disease, stage 3b: Secondary | ICD-10-CM | POA: Diagnosis present

## 2022-09-10 DIAGNOSIS — M19072 Primary osteoarthritis, left ankle and foot: Secondary | ICD-10-CM | POA: Diagnosis present

## 2022-09-10 DIAGNOSIS — Z170 Estrogen receptor positive status [ER+]: Secondary | ICD-10-CM | POA: Diagnosis not present

## 2022-09-10 DIAGNOSIS — Z171 Estrogen receptor negative status [ER-]: Secondary | ICD-10-CM | POA: Diagnosis not present

## 2022-09-10 DIAGNOSIS — I5022 Chronic systolic (congestive) heart failure: Secondary | ICD-10-CM | POA: Diagnosis present

## 2022-09-10 DIAGNOSIS — I130 Hypertensive heart and chronic kidney disease with heart failure and stage 1 through stage 4 chronic kidney disease, or unspecified chronic kidney disease: Secondary | ICD-10-CM | POA: Diagnosis present

## 2022-09-10 DIAGNOSIS — Z1152 Encounter for screening for COVID-19: Secondary | ICD-10-CM | POA: Diagnosis not present

## 2022-09-10 DIAGNOSIS — W19XXXA Unspecified fall, initial encounter: Secondary | ICD-10-CM | POA: Diagnosis present

## 2022-09-10 DIAGNOSIS — E669 Obesity, unspecified: Secondary | ICD-10-CM | POA: Diagnosis present

## 2022-09-10 DIAGNOSIS — L03119 Cellulitis of unspecified part of limb: Secondary | ICD-10-CM | POA: Diagnosis not present

## 2022-09-10 DIAGNOSIS — Z923 Personal history of irradiation: Secondary | ICD-10-CM | POA: Diagnosis not present

## 2022-09-10 DIAGNOSIS — E7439 Other disorders of intestinal carbohydrate absorption: Secondary | ICD-10-CM | POA: Diagnosis present

## 2022-09-10 DIAGNOSIS — Z9221 Personal history of antineoplastic chemotherapy: Secondary | ICD-10-CM | POA: Diagnosis not present

## 2022-09-10 DIAGNOSIS — L03115 Cellulitis of right lower limb: Secondary | ICD-10-CM | POA: Diagnosis present

## 2022-09-10 DIAGNOSIS — L03116 Cellulitis of left lower limb: Secondary | ICD-10-CM | POA: Diagnosis present

## 2022-09-10 DIAGNOSIS — R531 Weakness: Secondary | ICD-10-CM

## 2022-09-10 DIAGNOSIS — Z87891 Personal history of nicotine dependence: Secondary | ICD-10-CM | POA: Diagnosis not present

## 2022-09-10 DIAGNOSIS — I739 Peripheral vascular disease, unspecified: Secondary | ICD-10-CM | POA: Diagnosis present

## 2022-09-10 DIAGNOSIS — G4733 Obstructive sleep apnea (adult) (pediatric): Secondary | ICD-10-CM | POA: Diagnosis present

## 2022-09-10 DIAGNOSIS — C50911 Malignant neoplasm of unspecified site of right female breast: Secondary | ICD-10-CM | POA: Diagnosis present

## 2022-09-10 DIAGNOSIS — M170 Bilateral primary osteoarthritis of knee: Secondary | ICD-10-CM | POA: Diagnosis present

## 2022-09-10 DIAGNOSIS — R7303 Prediabetes: Secondary | ICD-10-CM | POA: Diagnosis present

## 2022-09-10 DIAGNOSIS — M19071 Primary osteoarthritis, right ankle and foot: Secondary | ICD-10-CM | POA: Diagnosis present

## 2022-09-10 DIAGNOSIS — Z7984 Long term (current) use of oral hypoglycemic drugs: Secondary | ICD-10-CM | POA: Diagnosis not present

## 2022-09-10 DIAGNOSIS — K219 Gastro-esophageal reflux disease without esophagitis: Secondary | ICD-10-CM | POA: Diagnosis present

## 2022-09-10 DIAGNOSIS — Y92009 Unspecified place in unspecified non-institutional (private) residence as the place of occurrence of the external cause: Secondary | ICD-10-CM | POA: Diagnosis not present

## 2022-09-10 DIAGNOSIS — N179 Acute kidney failure, unspecified: Secondary | ICD-10-CM | POA: Diagnosis present

## 2022-09-10 DIAGNOSIS — Z853 Personal history of malignant neoplasm of breast: Secondary | ICD-10-CM | POA: Diagnosis not present

## 2022-09-10 DIAGNOSIS — I10 Essential (primary) hypertension: Secondary | ICD-10-CM | POA: Diagnosis present

## 2022-09-10 HISTORY — DX: Other disorders of intestinal carbohydrate absorption: E74.39

## 2022-09-10 HISTORY — DX: Chronic kidney disease, stage 3b: N18.32

## 2022-09-10 LAB — BASIC METABOLIC PANEL
Anion gap: 9 (ref 5–15)
BUN: 30 mg/dL — ABNORMAL HIGH (ref 8–23)
CO2: 22 mmol/L (ref 22–32)
Calcium: 8.8 mg/dL — ABNORMAL LOW (ref 8.9–10.3)
Chloride: 106 mmol/L (ref 98–111)
Creatinine, Ser: 1.67 mg/dL — ABNORMAL HIGH (ref 0.44–1.00)
GFR, Estimated: 32 mL/min — ABNORMAL LOW (ref >60)
Glucose, Bld: 119 mg/dL — ABNORMAL HIGH (ref 70–99)
Potassium: 3.8 mmol/L (ref 3.5–5.1)
Sodium: 137 mmol/L (ref 135–145)

## 2022-09-10 LAB — CBC
HCT: 32.9 % — ABNORMAL LOW (ref 36.0–46.0)
Hemoglobin: 10 g/dL — ABNORMAL LOW (ref 12.0–15.0)
MCH: 26.5 pg (ref 26.0–34.0)
MCHC: 30.4 g/dL (ref 30.0–36.0)
MCV: 87.3 fL (ref 80.0–100.0)
Platelets: 244 10*3/uL (ref 150–400)
RBC: 3.77 MIL/uL — ABNORMAL LOW (ref 3.87–5.11)
RDW: 14.6 % (ref 11.5–15.5)
WBC: 12.2 10*3/uL — ABNORMAL HIGH (ref 4.0–10.5)
nRBC: 0 % (ref 0.0–0.2)

## 2022-09-10 LAB — TROPONIN I (HIGH SENSITIVITY): Troponin I (High Sensitivity): 43 ng/L — ABNORMAL HIGH (ref <18)

## 2022-09-10 LAB — BRAIN NATRIURETIC PEPTIDE: B Natriuretic Peptide: 113.1 pg/mL — ABNORMAL HIGH (ref 0.0–100.0)

## 2022-09-10 MED ORDER — ASPIRIN 81 MG PO TBEC
81.0000 mg | DELAYED_RELEASE_TABLET | Freq: Every day | ORAL | Status: DC
  Administered 2022-09-10 – 2022-09-16 (×6): 81 mg via ORAL
  Filled 2022-09-10 (×7): qty 1

## 2022-09-10 MED ORDER — ACETAMINOPHEN 325 MG PO TABS
650.0000 mg | ORAL_TABLET | Freq: Four times a day (QID) | ORAL | Status: DC | PRN
  Administered 2022-09-12 – 2022-09-15 (×4): 650 mg via ORAL
  Filled 2022-09-10 (×5): qty 2

## 2022-09-10 MED ORDER — DOCUSATE SODIUM 100 MG PO CAPS: 100.0000 mg | ORAL_CAPSULE | Freq: Two times a day (BID) | ORAL | Status: DC | PRN

## 2022-09-10 MED ORDER — LORATADINE 10 MG PO TABS
10.0000 mg | ORAL_TABLET | Freq: Every day | ORAL | Status: DC
  Administered 2022-09-10 – 2022-09-16 (×6): 10 mg via ORAL
  Filled 2022-09-10 (×7): qty 1

## 2022-09-10 MED ORDER — VANCOMYCIN HCL 1500 MG/300ML IV SOLN
1500.0000 mg | Freq: Once | INTRAVENOUS | Status: AC
  Administered 2022-09-10: 1500 mg via INTRAVENOUS
  Filled 2022-09-10: qty 300

## 2022-09-10 MED ORDER — CEPHALEXIN 500 MG PO CAPS: 500.0000 mg | ORAL_CAPSULE | Freq: Once | ORAL | Status: DC

## 2022-09-10 MED ORDER — VANCOMYCIN HCL IN DEXTROSE 1-5 GM/200ML-% IV SOLN
1000.0000 mg | Freq: Once | INTRAVENOUS | Status: AC
  Administered 2022-09-10: 1000 mg via INTRAVENOUS
  Filled 2022-09-10: qty 200

## 2022-09-10 MED ORDER — POLYETHYLENE GLYCOL 3350 17 G PO PACK
17.0000 g | PACK | Freq: Every day | ORAL | Status: DC | PRN
  Filled 2022-09-10: qty 1

## 2022-09-10 MED ORDER — VANCOMYCIN HCL 1250 MG/250ML IV SOLN: 1250.0000 mg | INTRAVENOUS | Status: DC

## 2022-09-10 MED ORDER — ACETAMINOPHEN 650 MG RE SUPP: 650.0000 mg | Freq: Four times a day (QID) | RECTAL | Status: DC | PRN

## 2022-09-10 MED ORDER — EMPAGLIFLOZIN 10 MG PO TABS
10.0000 mg | ORAL_TABLET | Freq: Every day | ORAL | Status: DC
  Administered 2022-09-12 – 2022-09-16 (×5): 10 mg via ORAL
  Filled 2022-09-10 (×6): qty 1

## 2022-09-10 MED ORDER — ONDANSETRON HCL 4 MG/2ML IJ SOLN
4.0000 mg | Freq: Four times a day (QID) | INTRAMUSCULAR | Status: DC | PRN
  Administered 2022-09-11: 4 mg via INTRAVENOUS
  Filled 2022-09-10: qty 2

## 2022-09-10 MED ORDER — ENOXAPARIN SODIUM 60 MG/0.6ML IJ SOSY
0.5000 mg/kg | PREFILLED_SYRINGE | INTRAMUSCULAR | Status: DC
  Administered 2022-09-10 – 2022-09-15 (×6): 55 mg via SUBCUTANEOUS
  Filled 2022-09-10 (×6): qty 0.6

## 2022-09-10 MED ORDER — CLONIDINE HCL 0.1 MG PO TABS
0.1000 mg | ORAL_TABLET | Freq: Two times a day (BID) | ORAL | Status: DC | PRN
  Administered 2022-09-13: 0.1 mg via ORAL
  Filled 2022-09-10: qty 1

## 2022-09-10 MED ORDER — LOSARTAN POTASSIUM 25 MG PO TABS
25.0000 mg | ORAL_TABLET | Freq: Every day | ORAL | Status: DC
  Administered 2022-09-10 – 2022-09-16 (×6): 25 mg via ORAL
  Filled 2022-09-10 (×7): qty 1

## 2022-09-10 MED ORDER — ISOSORBIDE MONONITRATE ER 30 MG PO TB24
60.0000 mg | ORAL_TABLET | Freq: Every day | ORAL | Status: DC
  Administered 2022-09-10 – 2022-09-16 (×6): 60 mg via ORAL
  Filled 2022-09-10: qty 1
  Filled 2022-09-10 (×4): qty 2
  Filled 2022-09-10: qty 1
  Filled 2022-09-10: qty 2

## 2022-09-10 MED ORDER — SODIUM CHLORIDE 0.9 % IV SOLN: 2.0000 g | Freq: Once | INTRAVENOUS | Status: DC

## 2022-09-10 MED ORDER — ONDANSETRON HCL 4 MG PO TABS: 4.0000 mg | ORAL_TABLET | Freq: Four times a day (QID) | ORAL | Status: DC | PRN

## 2022-09-10 MED ORDER — SODIUM CHLORIDE 0.9 % IV SOLN
2.0000 g | Freq: Two times a day (BID) | INTRAVENOUS | Status: DC
  Administered 2022-09-10 – 2022-09-13 (×7): 2 g via INTRAVENOUS
  Filled 2022-09-10 (×3): qty 12.5
  Filled 2022-09-10: qty 2
  Filled 2022-09-10: qty 12.5
  Filled 2022-09-10 (×2): qty 2

## 2022-09-10 MED ORDER — CEFAZOLIN SODIUM-DEXTROSE 1-4 GM/50ML-% IV SOLN: 1.0000 g | Freq: Once | INTRAVENOUS | Status: DC

## 2022-09-10 MED ORDER — ANASTROZOLE 1 MG PO TABS
1.0000 mg | ORAL_TABLET | Freq: Every day | ORAL | Status: DC
  Administered 2022-09-10 – 2022-09-16 (×6): 1 mg via ORAL
  Filled 2022-09-10 (×7): qty 1

## 2022-09-10 MED ORDER — OXYBUTYNIN CHLORIDE 5 MG PO TABS
5.0000 mg | ORAL_TABLET | Freq: Two times a day (BID) | ORAL | Status: DC
  Administered 2022-09-10 – 2022-09-16 (×12): 5 mg via ORAL
  Filled 2022-09-10 (×13): qty 1

## 2022-09-10 MED ORDER — PANTOPRAZOLE SODIUM 40 MG PO TBEC
40.0000 mg | DELAYED_RELEASE_TABLET | Freq: Every day | ORAL | Status: DC
  Administered 2022-09-10 – 2022-09-16 (×6): 40 mg via ORAL
  Filled 2022-09-10 (×7): qty 1

## 2022-09-10 NOTE — ED Notes (Addendum)
Pt on call light endorsing she is wet. This RN and Teacher, English as a foreign language to bedside. Pt, gown, and bedding saturated with urine. Pt cleaned, purewick placed, brief placed. New bedding on bed, new gown on pt, fresh warm blankets placed. Pt repositioned in bed for comfort, pillows under R side to relieve pressure from back and bottom. Pt in high fowlers position to eat meal tray. [place] provided per request. Call light is in reach, VSS, WCTM.

## 2022-09-10 NOTE — ED Notes (Signed)
Lt green and lavender tubes sent to the lab at this time.

## 2022-09-10 NOTE — ED Provider Notes (Signed)
[facility] Provider Note    Event Date/Time   First MD Initiated Contact with Patient 09/10/22 [phone]     (approximate)   History   Leg Pain and Fall   HPI  [name] is a 77 y.o. female   Past medical history of breast cancer, CKD, heart failure, sleep apnea, hypertension, chronic lower extremity wounds followed at wound center who presents to the emergency department bilateral lower extremity pain which she describes as chronic and having a fall at home while using walker, legs buckled underneath her.    She denies any acute medical complaints otherwise including chest pain, shortness of breath, respiratory infectious symptoms, GU symptoms abdominal pain nausea or vomiting.   History was obtained via the patient.  I reviewed wound care clinic visit dated 09/03/2022 which described dressing changes.      Physical Exam   Triage Vital Signs: ED Triage Vitals  Enc Vitals Group     BP 09/09/22 2032 (!) 157/78     Pulse Rate 09/09/22 2032 94     Resp 09/09/22 2032 16     Temp 09/09/22 2032 97.8 F (36.6 C)     Temp Source 09/09/22 2032 Oral     SpO2 09/09/22 2032 93 %     Weight 09/09/22 2031 243 lb (110.2 kg)     Height 09/09/22 2031 '5\' 9"'$  (1.753 m)     Head Circumference --      Peak Flow --      Pain Score 09/09/22 2031 8     Pain Loc --      Pain Edu? --      Excl. in [place]? --     Most recent vital signs: Vitals:   09/10/22 0133 09/10/22 0438  BP: (!) 147/92 127/65  Pulse: 83 86  Resp: 18 18  Temp: 98.7 F (37.1 C) 98.2 F (36.8 C)  SpO2: 96% 95%    General: Awake, no distress.  CV:  Good peripheral perfusion.  Resp:  Normal effort.  Abd:  No distention.  Other:  No signs of head trauma, awake answering questions appropriately.  I took down the dressings to bilateral lower extremities and see a small area of purulence without surrounding cellulitic changes, no crepitus, no fluctuance, no pain out of proportion,  neurovascular intact bilaterally.   ED Results / Procedures / Treatments   Labs (all labs ordered are listed, but only abnormal results are displayed) Labs Reviewed  CBC - Abnormal; Notable for the following components:      Result Value   WBC 12.2 (*)    RBC 3.77 (*)    Hemoglobin 10.0 (*)    HCT 32.9 (*)    All other components within normal limits  BASIC METABOLIC PANEL - Abnormal; Notable for the following components:   Glucose, Bld 119 (*)    BUN 30 (*)    Creatinine, Ser 1.67 (*)    Calcium 8.8 (*)    GFR, Estimated 32 (*)    All other components within normal limits  BRAIN NATRIURETIC PEPTIDE - Abnormal; Notable for the following components:   B Natriuretic Peptide 113.1 (*)    All other components within normal limits  TROPONIN I (HIGH SENSITIVITY) - Abnormal; Notable for the following components:   Troponin I (High Sensitivity) 43 (*)    All other components within normal limits  TROPONIN I (HIGH SENSITIVITY) - Abnormal; Notable for the following components:   Troponin I (High Sensitivity) 33 (*)  All other components within normal limits  RESP PANEL BY RT-PCR (RSV, FLU A&B, COVID)  [id number]  URINALYSIS, ROUTINE W REFLEX MICROSCOPIC     I reviewed labs and they are notable for blood cell count is 12.2.  Troponin is 43, 33.  Creatinine is at baseline 1.6  EKG  ED ECG REPORT I, Lucillie Garfinkel, the attending physician, personally viewed and interpreted this ECG.   Date: 09/10/2022  EKG Time: 2050  Rate: 84  Rhythm: normal sinus rhythm  Axis: nl  Intervals:none  ST&T Change: No acute ischemic changes    RADIOLOGY I independently reviewed and interpreted chest x-ray and see no obvious focalities or pneumothorax   PROCEDURES:  Critical Care performed: No  Procedures   MEDICATIONS ORDERED IN ED: Medications  cephALEXin (KEFLEX) capsule 500 mg (has no administration in time range)    Consultants:  I spoke with hospitalist regarding care plan for this  patient.   IMPRESSION / MDM / ASSESSMENT AND PLAN / ED COURSE  I reviewed the triage vital signs and the nursing notes.                              Differential diagnosis includes, but is not limited to, cellulitis, chronic wounds, fractures or dislocation, metabolic derangements, dehydration   MDM: This is a patient with multiple medical comorbidities and chronic leg wounds who states that the legs have been hurting her over the past several weeks to the point where she was unable to bear weight and had a minor fall today.  Fortunately x-rays of the knee negative for acute traumatic injuries fractures or dislocations.  The wounds appear not grossly infected no signs of necrotizing fasciitis though there is some purulence on one of the legs, with white blood cell count elevation and increased pain will cover for cellulitis.  Given that she is unable to bear weight and ambulate with her walker and she states she lives at home, will admit for treatment of cellulitis and potentially PT if continued to be a departure from baseline ambulating status after treatment.   Denies head strike but with the patient somnolent on my examination at 5 in the morning, will obtain a CT head to rule out intracranial bleeding.   Patient's presentation is most consistent with acute presentation with potential threat to life or bodily function.       FINAL CLINICAL IMPRESSION(S) / ED DIAGNOSES   Final diagnoses:  Cellulitis of lower extremity, unspecified laterality  Fall, initial encounter  Generalized weakness     Rx / DC Orders   ED Discharge Orders     None        Note:  This document was prepared using Dragon voice recognition software and may include unintentional dictation errors.    Lucillie Garfinkel, MD 09/10/22 0630

## 2022-09-10 NOTE — Consult Note (Signed)
Pharmacy Antibiotic Note  [name] is a 77 y.o. female admitted on 09/10/2022 with cellulitis.  Pharmacy has been consulted for Vancomycin and cefepime dosing.  Assessment: 77 yo F presents to ED with leg wounds, unable to bear weight.  WBC 12.2, Tmax 98, Scr 1.67   Plan: Vancomycin 2500 mg IV x 1, then Vancomycin 1250 mg IV Q 36 hrs. Goal AUC 400-550. Expected AUC: 516.9, Cmin 12.7, Cmax 34.8  SCr used: 1.67, Vd used 0.5 (BMI 35.88)   Cefepime 2 gm IV Q12H  Height: '5\' 9"'$  (175.3 cm) Weight: 110.2 kg (243 lb) IBW/kg (Calculated) : 66.2  Temp (24hrs), Avg:98.2 F (36.8 C), Min:97.8 F (36.6 C), Max:98.7 F (37.1 C)  Recent Labs  Lab 09/09/22 2039  WBC 12.2*  CREATININE 1.67*    Estimated Creatinine Clearance: 37.9 mL/min (A) (by C-G formula based on SCr of 1.67 mg/dL (H)).    No Known Allergies  Antimicrobials this admission: Vancomycin 1/3 >>  Cefepime 1/3 >>   Dose adjustments this admission: N/A  Microbiology results: N/A  Thank you for allowing pharmacy to be a part of this patient's care.  Alison Murray 09/10/2022 1:38 PM

## 2022-09-10 NOTE — H&P (Signed)
History and Physical    Patient: [name] UPJ:[id number] DOB: 04-12-1946 DOA: 09/10/2022 DOS: the patient was seen and examined on 09/10/2022 PCP: Donnie Coffin, MD  Patient coming from: Home  Chief Complaint:  Chief Complaint  Patient presents with   Leg Pain   Fall   HPI: [name] is a 77 y.o. female with medical history significant of gait abnormality, history of acute respiratory failure with hypoxia, history of pneumonia, sleep apnea, right breast cancer treated with chemo and radiotherapy, chronic systolic heart failure, GERD, headache, hypertension, peripheral neuropathy who presented to the emergency department with acute on chronic bilateral lower extremity pain following a fall at home earlier today.  No head trauma or any other injuries.  No prodromal symptoms.  Patient is chronically deconditioned and weak.  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 He denied fever, chills, rhinorrhea, sore throat, wheezing or hemoptysis.  No chest pain, palpitations, diaphoresis, PND, orthopnea or pitting edema of the lower extremities.  No abdominal pain, nausea, emesis, diarrhea, constipation, melena or hematochezia.  No flank pain, dysuria, frequency or hematuria.  No polyuria, polydipsia, polyphagia or blurred vision.   ED course: Initial vital signs were temperature 97.8 F, pulse 94, respirations 16, BP 157/78 mmHg O2 sat 93% on room air.  She received vancomycin in the emergency department.  Lab work: CBC showed white count 12.2, hemoglobin 10.0 g/dL platelets 244.  Troponin was 33 then 43 ng/L.  BNP 113.1 pg/mL.  BMP showed a glucose of 119, BUN 30, creatinine 1.67 and calcium 8.8 mg/deciliter.  The rest of the electrolytes were normal.  Coronavirus, influenza and RSV PCR was negative.  Imaging: CT head without contrast with no evidence of intracranial injury. 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  No acute or focal findings of the remainder of the bilateral tibia and fibula.   Review of Systems: As mentioned in the history of present illness. All other systems reviewed and are negative. Past Medical History:  Diagnosis Date   Abnormality of gait    Acute respiratory failure with hypoxia ([place]) 06/13/2022   Breast cancer ([place]) 02/05/2005   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    Breast cancer ([place]) 2019   right breast   CHF (congestive heart failure) ([place])    March 28, 2017 echo: EF 50-55%; 1) echo 07/2011: EF 20-25%, mild concentric hypertrophy, diffuse HK, regional WMA cannot be excluded, grade 1 DD, mitral valve with mild regurg, LA mildly dilated). 2) EF 40-45%, mild AS,                     Chronic kidney disease    Complication of anesthesia 2006   pt states "hard to wake up" after L breast surgery    Edema    GERD (gastroesophageal reflux disease)    Headache    Heart murmur    Hypertension    Hypokinesis    global, EF 35-45 % from Echo.   Malignant neoplasm of breast (female), unspecified site 06/14/2018   RIGHT T1b, N0; ER/PR positive, HER-2 negative.   Neuropathy    Obesity    Pain in joint, site unspecified    Personal history of chemotherapy 2006   left breast   Personal history of radiation therapy 2019   right breast   Personal  history of radiation therapy 2006   left breast   Pneumonia    Sleep apnea    Tinea pedis    Unspecified hearing loss    bilateral   Past Surgical History:  Procedure Laterality Date   BREAST BIOPSY Right 2019   11 oc-INVASIVE MAMMARY CARCINOMA, NO SPECIAL TYPE   BREAST BIOPSY Right 2019   1030 oc- neg   BREAST LUMPECTOMY  2006   left breast    CATARACT EXTRACTION     left eye   HERNIA REPAIR  05/25/2006   Incarcerated omentum in ventral hernia, Ventralight mesh, combined lap/ open with omental  resection.    PARTIAL MASTECTOMY WITH NEEDLE LOCALIZATION Right 06/14/2018   Procedure: PARTIAL MASTECTOMY WITH NEEDLE LOCALIZATION;  Surgeon: Robert Bellow, MD;  Location: [facility] ORS;  Service: General;  Laterality: Right;   PORT-A-CATH REMOVAL  05/25/2006   SENTINEL NODE BIOPSY Right 06/14/2018   Procedure: SENTINEL NODE BIOPSY;  Surgeon: Robert Bellow, MD;  Location: [facility] ORS;  Service: General;  Laterality: Right;   TUBAL LIGATION     Social History:  reports that she quit smoking about 22 years ago. Her smoking use included cigarettes. She has a 15.00 pack-year smoking history. She has never used smokeless tobacco. She reports that she does not currently use drugs after having used the following drugs: Marijuana. She reports that she does not drink alcohol.  No Known Allergies  Family History  Problem Relation Age of Onset   Hypertension Mother        deceased 57   Cataracts Mother    Breast cancer Other 60       maternal half-sister; deceased 58   Coronary artery disease Other     Prior to Admission medications   Medication Sig Start Date End Date Taking? Authorizing Provider  losartan (COZAAR) 25 MG tablet Take 25 mg by mouth daily. 08/28/22  Yes [provider]  acetaminophen-codeine (TYLENOL #3) 300-30 MG tablet Take 1-2 tablets by mouth every 4 (four) hours as needed for moderate pain. 06/05/22   Felipa Furnace, DPM  anastrozole (ARIMIDEX) 1 MG tablet TAKE 1 TABLET BY MOUTH ONCE DAILY. MUST MAKE AN APPOINTMENT TO SEE DR. RAO. 08/14/22   Sindy Guadeloupe, MD  aspirin EC 81 MG tablet Take 81 mg by mouth daily.    [provider]  cloNIDine (CATAPRES) 0.1 MG tablet Take 1 tablet (0.1 mg total) by mouth 2 (two) times daily as needed (systolic BP > 409 or diastolic BP > 95). 08/23/22   Sharen Hones, MD  [employer] CALCIUM + D3 600-20 MG-MCG TABS TAKE 1 TABLET BY MOUTH TWICE A DAY 02/04/22   Sindy Guadeloupe, MD  diclofenac Sodium (VOLTAREN) 1 % GEL Apply 1 application  topically as needed. 04/27/20   [provider]  Docusate Sodium 100 MG capsule Take 100 mg by mouth 2 (two) times daily as needed. 02/13/22   [provider]  empagliflozin (JARDIANCE) 10 MG TABS tablet Take 1 tablet (10 mg total) by mouth daily before breakfast. 08/23/22   Sharen Hones, MD  Incontinence Supply Disposable (BLADDER CONTROL PADS EX ABSORB) MISC Apply 1 Dose topically as needed.  03/22/18   [provider]  isosorbide mononitrate (IMDUR) 60 MG 24 hr tablet Take 1 tablet (60 mg total) by mouth 2 (two) times daily. 08/23/22   Sharen Hones, MD  loratadine (CLARITIN) 10 MG tablet Take 10 mg by mouth daily. 07/20/19   [provider]  Merril Abbe  17 GM/SCOOP powder SMARTSIG:2 Scoopful By Mouth Daily PRN 04/10/21   [provider]  omeprazole (PRILOSEC) 40 MG capsule Take 40 mg by mouth daily. 04/13/21   [provider]  oxybutynin (DITROPAN) 5 MG tablet Take 5 mg by mouth 2 (two) times daily.    [provider]    Physical Exam: Vitals:   09/10/22 0800 09/10/22 0810 09/10/22 1030 09/10/22 1100  BP: (!) 114/52  131/80 117/63  Pulse: 61 66 73 (!) 54  Resp: _0 Temp:   98 F (36.7 C)   TempSrc:      SpO2: 96%  98% 95%  Weight:      Height:       Physical Exam Vitals and nursing note reviewed.  Constitutional:      General: She is awake. She is not in acute distress.    Appearance: Normal appearance. She is obese. She is ill-appearing.  HENT:     Head: Normocephalic.     Nose: No rhinorrhea.     Mouth/Throat:     Mouth: Mucous membranes are moist.  Eyes:     General: No scleral icterus.    Pupils: Pupils are equal, round, and reactive to light.  Neck:     Vascular: No JVD.  Cardiovascular:     Rate and Rhythm: Normal rate and regular rhythm.     Heart sounds: S1 normal and S2 normal.  Pulmonary:     Effort: Pulmonary effort is normal.     Breath sounds: Normal breath sounds. No wheezing, rhonchi or rales.   Abdominal:     General: Bowel sounds are normal. There is no distension.     Palpations: Abdomen is soft.     Tenderness: There is no abdominal tenderness. There is no guarding.  Musculoskeletal:     Cervical back: Neck supple.  Skin:    General: Skin is warm and dry.     Findings: Erythema and wound present.     Comments: Bilateral pretibial wounds with seropurulent discharge.    Neurological:     General: No focal deficit present.     Mental Status: She is alert and oriented to person, place, and time.  Psychiatric:        Behavior: Behavior is cooperative.   Data Reviewed:  Results are pending, will review when available.  Assessment and Plan: Principal Problem:   Cellulitis of lower extremity Admit to MedSurg/inpatient. Continue IV fluids. Continue cefepime 2 g every 8 hours.   Continue vancomycin per pharmacy. Continue local care. Blood glucose control. Follow-up blood culture and sensitivity Follow CBC and CMP in a.m.  Active Problems:   Essential hypertension Continue losartan 25 mg p.o. daily. Continue clonidine 0.1 mg p.o. twice daily as needed. She is also on Imdur 60 mg p.o. twice daily.    Chronic systolic CHF (congestive heart failure) (HCC) Continue losartan and isosorbide as above.    OSA (obstructive sleep apnea) CPAP at bedtime.    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 daily.    PAD (peripheral artery disease) (HCC) Continue aspirin 81 mg p.o. daily. Might benefit from statin.    Class II obesity Lifestyle modifications.    Chronic kidney disease, stage 3b ([place]) Monitor renal function electrolytes.    Glucose intolerance Carbohydrate modified diet. Continue Jardiance 10 mg p.o. daily. CBG monitoring before meals and bedtime. Begin RI SS if glucose trends upwards. Check hemoglobin A1c.    Advance Care  Planning:   Code Status: Full Code   Consults:   Family Communication:   Severity of  Illness: The appropriate patient status for this patient is INPATIENT. Inpatient status is judged to be reasonable and necessary in order to provide the required intensity of service to ensure the patient's safety. The patient's presenting symptoms, physical exam findings, and initial radiographic and laboratory data in the context of their chronic comorbidities is felt to place them at high risk for further clinical deterioration. Furthermore, it is not anticipated that the patient will be medically stable for discharge from the hospital within 2 midnights of admission.   * I certify that at the point of admission it is my clinical judgment that the patient will require inpatient hospital care spanning beyond 2 midnights from the point of admission due to high intensity of service, high risk for further deterioration and high frequency of surveillance required.*  Author: Reubin Milan, MD 09/10/2022 11:21 AM  For on call review [email or web address].   This document was prepared using Dragon voice recognition software and may contain some unintended transcription errors.

## 2022-09-10 NOTE — ED Notes (Signed)
Report received from [place], [place]

## 2022-09-10 NOTE — Progress Notes (Signed)
PHARMACIST - PHYSICIAN COMMUNICATION  CONCERNING:  Enoxaparin (Lovenox) for DVT Prophylaxis    RECOMMENDATION: Patient was prescribed enoxaparin '40mg'$  q24 hours for VTE prophylaxis.   Filed Weights   09/09/22 2031  Weight: 110.2 kg (243 lb)    Body mass index is 35.88 kg/m.  Estimated Creatinine Clearance: 37.9 mL/min (A) (by C-G formula based on SCr of 1.67 mg/dL (H)).  Based on [place] patient is candidate for enoxaparin 0.'5mg'$ /kg TBW SQ every 24 hours based on BMI being >30.  DESCRIPTION: Pharmacy has adjusted enoxaparin dose per [facility] policy.  Patient is now receiving enoxaparin 55 mg every 24 hours   Benita Gutter 09/10/2022 1:26 PM

## 2022-09-10 NOTE — Progress Notes (Signed)
PHARMACY -  BRIEF ANTIBIOTIC NOTE   Pharmacy has received consult(s) for Vancomycin  from an ED provider.  The patient's profile has been reviewed for ht/wt/allergies/indication/available labs.    One time order(s) placed for Vancomycin 2500 mg IV X 1.   Further antibiotics/pharmacy consults should be ordered by admitting physician if indicated.                       Thank you, Indalecio Malmstrom D 09/10/2022  6:46 AM

## 2022-09-11 ENCOUNTER — Inpatient Hospital Stay: Payer: Medicare Other

## 2022-09-11 DIAGNOSIS — L03119 Cellulitis of unspecified part of limb: Secondary | ICD-10-CM | POA: Diagnosis not present

## 2022-09-11 LAB — CBC
HCT: 28.3 % — ABNORMAL LOW (ref 36.0–46.0)
Hemoglobin: 8.7 g/dL — ABNORMAL LOW (ref 12.0–15.0)
MCH: 26.9 pg (ref 26.0–34.0)
MCHC: 30.7 g/dL (ref 30.0–36.0)
MCV: 87.6 fL (ref 80.0–100.0)
Platelets: 228 10*3/uL (ref 150–400)
RBC: 3.23 MIL/uL — ABNORMAL LOW (ref 3.87–5.11)
RDW: 14.7 % (ref 11.5–15.5)
WBC: 10.5 10*3/uL (ref 4.0–10.5)
nRBC: 0 % (ref 0.0–0.2)

## 2022-09-11 LAB — COMPREHENSIVE METABOLIC PANEL
ALT: 18 U/L (ref 0–44)
AST: 45 U/L — ABNORMAL HIGH (ref 15–41)
Albumin: 2.5 g/dL — ABNORMAL LOW (ref 3.5–5.0)
Alkaline Phosphatase: 48 U/L (ref 38–126)
Anion gap: 8 (ref 5–15)
BUN: 23 mg/dL (ref 8–23)
CO2: 22 mmol/L (ref 22–32)
Calcium: 8.2 mg/dL — ABNORMAL LOW (ref 8.9–10.3)
Chloride: 112 mmol/L — ABNORMAL HIGH (ref 98–111)
Creatinine, Ser: 1.2 mg/dL — ABNORMAL HIGH (ref 0.44–1.00)
GFR, Estimated: 47 mL/min — ABNORMAL LOW (ref >60)
Glucose, Bld: 78 mg/dL (ref 70–99)
Potassium: 4 mmol/L (ref 3.5–5.1)
Sodium: 142 mmol/L (ref 135–145)
Total Bilirubin: 1.2 mg/dL (ref 0.3–1.2)
Total Protein: 6.6 g/dL (ref 6.5–8.1)

## 2022-09-11 LAB — GLUCOSE, CAPILLARY: Glucose-Capillary: 147 mg/dL — ABNORMAL HIGH (ref 70–99)

## 2022-09-11 LAB — IRON AND TIBC
Iron: 33 ug/dL (ref 28–170)
Saturation Ratios: 17 % (ref 10.4–31.8)
TIBC: 197 ug/dL — ABNORMAL LOW (ref 250–450)
UIBC: 164 ug/dL

## 2022-09-11 LAB — VITAMIN B12: Vitamin B-12: 775 pg/mL (ref 180–914)

## 2022-09-11 LAB — VITAMIN D 25 HYDROXY (VIT D DEFICIENCY, FRACTURES): Vit D, 25-Hydroxy: 37.38 ng/mL (ref 30–100)

## 2022-09-11 LAB — FOLATE: Folate: 7.8 ng/mL (ref >5.9)

## 2022-09-11 LAB — CBG MONITORING, ED: Glucose-Capillary: 75 mg/dL (ref 70–99)

## 2022-09-11 MED ORDER — BISACODYL 10 MG RE SUPP: 10.0000 mg | Freq: Every day | RECTAL | Status: DC | PRN

## 2022-09-11 MED ORDER — BISACODYL 5 MG PO TBEC
10.0000 mg | DELAYED_RELEASE_TABLET | Freq: Once | ORAL | Status: AC
  Administered 2022-09-11: 10 mg via ORAL
  Filled 2022-09-11: qty 2

## 2022-09-11 MED ORDER — BISACODYL 5 MG PO TBEC
5.0000 mg | DELAYED_RELEASE_TABLET | Freq: Every day | ORAL | Status: DC
  Administered 2022-09-11: 5 mg via ORAL
  Filled 2022-09-11: qty 1

## 2022-09-11 MED ORDER — POLYETHYLENE GLYCOL 3350 17 G PO PACK
17.0000 g | PACK | Freq: Two times a day (BID) | ORAL | Status: DC
  Administered 2022-09-11 – 2022-09-16 (×9): 17 g via ORAL
  Filled 2022-09-11 (×10): qty 1

## 2022-09-11 MED ORDER — METOPROLOL TARTRATE 5 MG/5ML IV SOLN: 5.0000 mg | Freq: Four times a day (QID) | INTRAVENOUS | Status: DC | PRN

## 2022-09-11 MED ORDER — VANCOMYCIN HCL IN DEXTROSE 1-5 GM/200ML-% IV SOLN
1000.0000 mg | INTRAVENOUS | Status: DC
  Administered 2022-09-11 – 2022-09-13 (×3): 1000 mg via INTRAVENOUS
  Filled 2022-09-11 (×3): qty 200

## 2022-09-11 MED ORDER — HYDRALAZINE HCL 20 MG/ML IJ SOLN
10.0000 mg | Freq: Four times a day (QID) | INTRAMUSCULAR | Status: DC | PRN
  Administered 2022-09-11: 10 mg via INTRAVENOUS

## 2022-09-11 NOTE — Progress Notes (Addendum)
[facility] Progress Note  Patient: [name]    UVO:[id number]  DOA: 09/10/2022     Date of Service: the patient was seen and examined on 09/11/2022  Chief Complaint  Patient presents with   Leg Pain   Fall   Brief hospital course:  [name] is a 77 y.o. female with medical history significant of gait abnormality, history of acute respiratory failure with hypoxia, history of pneumonia, sleep apnea, right breast cancer treated with chemo and radiotherapy, chronic systolic heart failure, GERD, headache, hypertension, peripheral neuropathy who presented to the emergency department with acute on chronic bilateral lower extremity pain following a fall at home on 09/10/22.  ED course: Initial vital signs were temperature 97.8 F, pulse 94, respirations 16, BP 157/78 mmHg O2 sat 93% on room air.  She received vancomycin in the emergency department.   Lab work: CBC showed white count 12.2, hemoglobin 10.0 g/dL platelets 244.  Troponin was 33 then 43 ng/L.  BNP 113.1 pg/mL.  BMP showed a glucose of 119, BUN 30, creatinine 1.67 and calcium 8.8 mg/deciliter.  The rest of the electrolytes were normal.  Coronavirus, influenza and RSV PCR was negative.   Imaging: CT head without contrast with no evidence of intracranial injury. Bilateral tibia/fibula with chronic osteoarthritis of the knees and ankle.  No acute or focal findings of the remainder of the bilateral tibia and fibula.   Assessment and Plan:  Cellulitis of lower extremity S/p IV fluids, no more need.  Continue oral hydration Continue cefepime 2 g every 8 hours.   Continue vancomycin per pharmacy. Continue local care. Blood glucose control. Follow-up blood culture and sensitivity Follow CBC and CMP in a.m.    AKI on CKD stage IIIb, 1.67--1.2 gradually improving Continue oral hydration and monitor renal function daily   Essential hypertension Continue losartan 25 mg p.o. daily. Continue clonidine 0.1 mg p.o. twice  daily as needed. She is also on Imdur 60 mg p.o. twice daily. Use as needed hydralazine and Lopressor  Chronic systolic CHF (congestive heart failure) (HCC) Continue losartan and isosorbide as above.   OSA (obstructive sleep apnea) CPAP at bedtime.   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 daily.   PAD (peripheral artery disease) (HCC) Continue aspirin 81 mg p.o. daily. Might benefit from statin.   Class II obesity Lifestyle modifications.   Chronic kidney disease, stage 3b ([place]) Monitor renal function electrolytes.   Glucose intolerance Carbohydrate modified diet. Continue Jardiance 10 mg p.o. daily. CBG monitoring before meals and bedtime. Begin RI SS if glucose trends upwards. Check hemoglobin A1c.  Right renal cyst, incidental finding CT A/P: 2 cm exophytic lesion in the midportion of right kidney with density measurements higher than usual for simple cysts. This may suggest hemorrhagic or proteinaceous cyst or solid lesion. Follow-up multiphasic CT or MRI may be considered. Recommended to follow with PCP as an outpatient.  Severe osteoarthritis of bilateral knee joints X-ray reviewed, no significant joint effusion Follow-up with orthopedic surgery as an outpatient Follow PT and OT eval   Constipation, started MiraLAX and Dulcolax. CT a/p rule out any obstruction.  Body mass index is 35.88 kg/m.  Interventions:       Diet: Heart healthy/carb modified DVT Prophylaxis: Subcutaneous Lovenox   Advance goals of care discussion: Full code  Family Communication: family was NOT present at bedside, at the time of interview.  The pt provided permission to discuss medical plan with the family. Opportunity was given to  ask question and all questions were answered satisfactorily.   Disposition:  Pt is from Home, admitted with Fall and LE cellulitis, still on IV antibiotics, pending PT/OT eval, which precludes a safe  discharge. Discharge to SNF vs Home w/HHPT, when stable, and awaiting for PT/OT eval.  Subjective: No significant events overnight, patient was admitted yesterday due to bilateral lower extremity cellulitis and falls.  In the morning the patient had an episode of vomiting and mentioned that she did not move bowels for 1 week.  Denies any abdominal pain, no chest pain or palpitations, no shortness of breath. Patient does have chronic pain in the bilateral lower extremities and knee joints  Physical Exam: General:  alert oriented to time, place, and person.  Appear in mild distress, affect appropriate Eyes: PERRLA ENT: Oral Mucosa Clear, moist  Neck: no JVD,  Cardiovascular: S1 and S2 Present, no Murmur,  Respiratory: good respiratory effort, Bilateral Air entry equal and Decreased, no Crackles, no wheezes Abdomen: Bowel Sound present, Soft and no tenderness,  Skin: no rashes Extremities: no significant pedal edema, no calf tenderness, chronic pigmentation, possible cellulitis left lower extremity Neurologic: without any new focal findings Gait not checked due to patient safety concerns  Vitals:   09/11/22 0614 09/11/22 0630 09/11/22 0700 09/11/22 1000  BP:  (!) 151/68 (!) 167/70 (!) 164/75  Pulse:  78 90 92  Resp:  '16 18 20  '$ Temp: 99 F (37.2 C)   99.3 F (37.4 C)  TempSrc: Oral     SpO2:  96% 96% 95%  Weight:      Height:        Intake/Output Summary (Last 24 hours) at 09/11/2022 1427 Last data filed at 09/10/2022 1502 Gross per 24 hour  Intake 100 ml  Output --  Net 100 ml   Filed Weights   09/09/22 2031  Weight: 110.2 kg    Data Reviewed: I have personally reviewed and interpreted daily labs, tele strips, imagings as discussed above. I reviewed all nursing notes, pharmacy notes, vitals, pertinent old records I have discussed plan of care as described above with RN and patient/family.  CBC: Recent Labs  Lab 09/09/22 2039 09/11/22 0420  WBC 12.2* 10.5  HGB 10.0*  8.7*  HCT 32.9* 28.3*  MCV 87.3 87.6  PLT 244 025   Basic Metabolic Panel: Recent Labs  Lab 09/09/22 2039 09/11/22 0420  NA 137 142  K 3.8 4.0  CL 106 112*  CO2 22 22  GLUCOSE 119* 78  BUN 30* 23  CREATININE 1.67* 1.20*  CALCIUM 8.8* 8.2*    Studies: CT ABDOMEN PELVIS WO CONTRAST  Result Date: 09/11/2022 CLINICAL DATA:  Abdominal pain EXAM: CT ABDOMEN AND PELVIS WITHOUT CONTRAST TECHNIQUE: Multidetector CT imaging of the abdomen and pelvis was performed following the standard protocol without IV contrast. RADIATION DOSE REDUCTION: This exam was performed according to the departmental dose-optimization program which includes automated exposure control, adjustment of the mA and/or kV according to patient size and/or use of iterative reconstruction technique. COMPARISON:  05/28/2022 FINDINGS: Lower chest: There is interval appearance of small bilateral pleural effusions, more so on the left side. Motion artifacts limit evaluation of lower lung fields. Possible minimal subsegmental atelectasis is seen in the lower lung fields. Hepatobiliary: There is no dilation of bile ducts. There is 7 mm low-density in the liver, possibly a cyst with no significant change. Gallbladder is slightly distended. There is no wall thickening. There is subtle increased density in the dependent portion of  gallbladder lumen. Pancreas: No focal abnormalities are seen. Spleen: Spleen is not enlarged.  Motion artifacts limit evaluation. Adrenals/Urinary Tract: Adrenals are unremarkable. There is no hydronephrosis. There is 4.1 cm simple appearing cyst in the upper pole of right kidney. There is 2 cm exophytic lesion in the lateral margin of midportion of right kidney with density measurements higher than usual for simple cyst. There are no renal or ureteral stones. Urinary bladder is unremarkable. Stomach/Bowel: Small hiatal hernia is seen. Stomach is not distended. Small bowel loops are not dilated. Appendix is not  distinctly seen. There is no pericecal inflammation. There is no significant wall thickening in colon. There is no pericolic stranding. Vascular/Lymphatic: Scattered calcifications are seen in aorta and its major branches. 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 Other: There is no ascites or pneumoperitoneum. Umbilical hernia containing fat is seen. There is previous ventral hernia repair in midline in suprapubic region. Musculoskeletal: There is first-degree anterolisthesis at L4-L5 level. There is disc space narrowing at the L5-S1 level. Degenerative changes are noted in right hip with bony spurs and subcortical cysts. IMPRESSION: There is no evidence of intestinal obstruction or pneumoperitoneum. There is no hydronephrosis. There is interval appearance of small bilateral pleural effusions and subsegmental atelectasis in the lower lung fields. There is subtle increased density in the lumen of gallbladder which may suggest presence of sludge and possibly tiny stones. There are no imaging signs of acute cholecystitis. There is 2 cm exophytic lesion in the midportion of right kidney with density measurements higher than usual for simple cysts. This may suggest hemorrhagic or proteinaceous cyst or solid lesion. Follow-up multiphasic CT or MRI may be considered. There is simple appearing cyst in the upper pole of right kidney. There is subcentimeter low-density in the liver, possibly cyst or hemangioma. Small hiatal hernia is seen. Uterine fibroids. Degenerative changes are noted in lumbar spine and right hip. Electronically Signed   By: Elmer Picker M.D.   On: 09/11/2022 10:34   DG Knee 1-2 Views Right  Result Date: 09/11/2022 CLINICAL DATA:  Pain and swelling in both knees, LEFT knee pain EXAM: RIGHT KNEE - 1-2 VIEW COMPARISON:  09/09/2022 FINDINGS: Osseous demineralization. Advanced tricompartmental osteoarthritic changes with joint space narrowing and spur  formation as well as irregularity of articular surfaces. Bulky spurs at patellofemoral joint and medial compartment posteriorly. No acute fracture, dislocation, or bone destruction. Associated regional soft tissue swelling without definite joint effusion. IMPRESSION: Advanced tricompartmental osteoarthritic changes RIGHT knee. Electronically Signed   By: Lavonia Dana M.D.   On: 09/11/2022 10:06   DG Knee 1-2 Views Left  Result Date: 09/11/2022 CLINICAL DATA:  Pain and swelling of the left knee. EXAM: LEFT KNEE - 1-2 VIEW COMPARISON:  09/09/2022 FINDINGS: There is diffuse soft tissue swelling about the left knee. No joint effusion. Severe tricompartment osteoarthritis is noted with joint space narrowing, subchondral sclerosis and marginal spur formation. No sign of fracture or dislocation. IMPRESSION: 1. Soft tissue swelling. 2. Severe tricompartment osteoarthritis. Electronically Signed   By: Kerby Moors M.D.   On: 09/11/2022 10:04    Scheduled Meds:  anastrozole  1 mg Oral Daily   aspirin EC  81 mg Oral Daily   empagliflozin  10 mg Oral Daily   enoxaparin (LOVENOX) injection  0.5 mg/kg Subcutaneous Q24H   isosorbide mononitrate  60 mg Oral Daily   loratadine  10 mg Oral Daily   losartan  25 mg Oral Daily   oxybutynin  5  mg Oral BID   pantoprazole  40 mg Oral Daily   Continuous Infusions:  ceFEPime (MAXIPIME) IV Stopped (09/11/22 0251)   vancomycin     PRN Meds: acetaminophen **OR** acetaminophen, cloNIDine, docusate sodium, hydrALAZINE, metoprolol tartrate, ondansetron **OR** ondansetron (ZOFRAN) IV, polyethylene glycol  Time spent: 50 minutes  Author: Val Riles. MD [facility] 09/11/2022 2:27 PM  To reach On-call, see care teams to locate the attending and reach out to them via [email or web address]. If 7PM-7AM, please contact night-coverage If you still have difficulty reaching the attending provider, please page the [facility] (Director on Call) for [facility] on amion for  assistance.

## 2022-09-11 NOTE — Consult Note (Signed)
Pharmacy Antibiotic Note  [name] is a 77 y.o. female admitted on 09/10/2022 with cellulitis.  Pharmacy has been consulted for Vancomycin and cefepime dosing.  Assessment: 77 yo F presents to ED with leg wounds, unable to bear weight.  WBC 10.5, Tmax 99.7, Scr 1.2  Plan: Adjust vancomycin to 1000 mg IV every 24 hours Estimated AUC 466.3, Cmin 12.2 Continue Cefepime 2 grams IV every 12 hours Vancomycin levels as clinically appropriate Monitor renal function for needed adjustments  Height: '5\' 9"'$  (175.3 cm) Weight: 110.2 kg (243 lb) IBW/kg (Calculated) : 66.2  Temp (24hrs), Avg:99 F (37.2 C), Min:98 F (36.7 C), Max:99.7 F (37.6 C)  Recent Labs  Lab 09/09/22 2039 09/11/22 0420  WBC 12.2* 10.5  CREATININE 1.67* 1.20*     Estimated Creatinine Clearance: 52.8 mL/min (A) (by C-G formula based on SCr of 1.2 mg/dL (H)).    No Known Allergies  Antimicrobials this admission: Vancomycin 1/3 >>  Cefepime 1/3 >>   Dose adjustments this admission: 1/4 Vancomycin 1250 mg IV Q36h to 1000 mg IV Q24h  Microbiology results: N/A  Thank you for allowing pharmacy to be a part of this patient's care.  Lorin Picket, PharmD 09/11/2022 10:02 AM

## 2022-09-11 NOTE — ED Notes (Signed)
Purewick was not working properly. Patient cleaned due to incontinence. New diaper and gown applied to patient. New purewick placed. Patient repositioned on right side

## 2022-09-12 DIAGNOSIS — L03119 Cellulitis of unspecified part of limb: Secondary | ICD-10-CM | POA: Diagnosis not present

## 2022-09-12 LAB — GLUCOSE, CAPILLARY
Glucose-Capillary: 101 mg/dL — ABNORMAL HIGH (ref 70–99)
Glucose-Capillary: 113 mg/dL — ABNORMAL HIGH (ref 70–99)
Glucose-Capillary: 81 mg/dL (ref 70–99)
Glucose-Capillary: 84 mg/dL (ref 70–99)
Glucose-Capillary: 88 mg/dL (ref 70–99)

## 2022-09-12 LAB — HEMOGLOBIN A1C
Hgb A1c MFr Bld: 6.3 % — ABNORMAL HIGH (ref 4.8–5.6)
Mean Plasma Glucose: 134 mg/dL

## 2022-09-12 MED ORDER — BISACODYL 5 MG PO TBEC
10.0000 mg | DELAYED_RELEASE_TABLET | Freq: Every day | ORAL | Status: DC
  Administered 2022-09-12 – 2022-09-13 (×2): 10 mg via ORAL
  Filled 2022-09-12 (×3): qty 2

## 2022-09-12 MED ORDER — MINERAL OIL RE ENEM
1.0000 | ENEMA | Freq: Once | RECTAL | Status: AC
  Administered 2022-09-12: 1 via RECTAL

## 2022-09-12 MED ORDER — SODIUM CHLORIDE 0.9 % IV SOLN: INTRAVENOUS | Status: DC | PRN

## 2022-09-12 NOTE — Evaluation (Addendum)
Occupational Therapy Evaluation Patient Details Name: [name] MRN: [id number] DOB: [birth year] Today's Date: 09/12/2022   History of Present Illness 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 Was d/c from hospital on 08/23/22; since has had at least 5 falls at home, most recently yesterday.   Clinical Impression   Patient received for OT evaluation. See flowsheet below for details of function. Generally, patient requiring CGA for bed mobility to the edge of the bed, MAX A bed mobility back into bed, and set up-MAX A for ADLs. Pt with pain in BIL knees (chronic OA) in sitting/scooting EOB. Deferred standing pending PT evaluation given pt's multiple falls in the past 3 weeks. Significant discussion with pt about d/c to SNF, which she sounded open to, and pt expressing desire to d/c to a different venue than daughter's home once d/c from SNF; OT sent message to case manager to relay message about pt's preferences and needing more information about what is feasible for her. Pt continues to be a very high falls risk and presents with increased weakness, increased ADL assistance, and decreased mobility. Pt on 2L O2 throughout session; pt states she is not on O2 at home, although states she isn't sure if perhaps she should be. Patient will benefit from continued OT while in acute care.       Recommendations for follow up therapy are one component of a multi-disciplinary discharge planning process, led by the attending physician.  Recommendations may be updated based on patient status, additional functional criteria and insurance authorization.   Follow Up Recommendations  Skilled nursing-short term rehab (<3 hours/day)     Assistance Recommended at Discharge Frequent or constant Supervision/Assistance  Patient can return home with the  following A lot of help with walking and/or transfers;A lot of help with bathing/dressing/bathroom    Functional Status Assessment  Patient has had a recent decline in their functional status and demonstrates the ability to make significant improvements in function in a reasonable and predictable amount of time.  Equipment Recommendations  Other (comment) (defer to next venue of care)    Recommendations for Other Services       Precautions / Restrictions Precautions Precautions: Fall Restrictions Weight Bearing Restrictions: No      Mobility Bed Mobility Overal bed mobility: Needs Assistance Bed Mobility: Supine to Sit, Sit to Supine     Supine to sit: Min guard, HOB elevated Sit to supine: Max assist, HOB elevated   General bed mobility comments: Pt with pain in BIL LE due to wounds/edema; is asking about having her legs wrapped (which she does at wound clinic every week)    Transfers Overall transfer level: Needs assistance   Transfers:  (scoot transfer at EOB towards [facility])             General transfer comment: Pt requiring CGA-MIN A for scoot towards HOB approx 18 inches; extra effort; BIL knee pain with scooting.      Balance Overall balance assessment: Needs assistance Sitting-balance support: Feet supported Sitting balance-Leahy Scale: Good Sitting balance - Comments: Once feet on floor, pt able to sit at EOB without assistance; able to use BIL hands for functional task.                                   ADL  either performed or assessed with clinical judgement   ADL Overall ADL's : Needs assistance/impaired Eating/Feeding: Set up;Sitting Eating/Feeding Details (indicate cue type and reason): Pt finished eating breakfast while seated EOB; drank some coffee. OT set up pt with soda in cup with straw at end of session in semi-reclined, per her request. Grooming: Set up;Sitting (anticipated; only safe for sitting at this time)   Upper Body  Bathing: Set up;Sitting (anticipated)   Lower Body Bathing: Maximal assistance;Sitting/lateral leans (anticipated)   Upper Body Dressing : Set up;Sitting (anticipated)   Lower Body Dressing: Maximal assistance Lower Body Dressing Details (indicate cue type and reason): pt required dependent assistance for donning socks today; states that at baseline she is typically able to don socks and shoes. Toilet Transfer:  (Did not attempt standing today 2/2 knee pain and strong history of falls, with only OT in room. Will defer to PT on transfers at this time.)   Toileting- Clothing Manipulation and Hygiene: Maximal assistance (anticipated)   Tub/ Shower Transfer:  (sponge bathe (per pt's baseline))   Functional mobility during ADLs:  (Did not attempt today; pt with difficulty putting weight on BIL knees during scooting.) General ADL Comments: EOB only today; PT evaluation pending for standing/mobility tolerance. Pt able to scoot at EOB with MIN-CGA.     Vision         Perception     Praxis      Pertinent Vitals/Pain Pain Assessment Pain Assessment: 0-10 Pain Score:  (unrated) Pain Location: BIL knees; pt states this is chronic. Pain also in BIL lower legs 2/2 wounds/edema Pain Descriptors / Indicators: Aching, Grimacing, Sore Pain Intervention(s): Limited activity within patient's tolerance     Hand Dominance     Extremity/Trunk Assessment Upper Extremity Assessment Upper Extremity Assessment: Overall WFL for tasks assessed;Generalized weakness (Pt with deficits in BIL UE AROM (decreased shoulder ROM to about 90 degrees flexion). Hand strength functional for most daily activities.)   Lower Extremity Assessment Lower Extremity Assessment: Generalized weakness;Defer to PT evaluation (Pt with significant pain in BIL knees with activity)       Communication Communication Communication: No difficulties   Cognition Arousal/Alertness: Awake/alert Behavior During Therapy: WFL for  tasks assessed/performed Overall Cognitive Status: Within Functional Limits for tasks assessed                                 General Comments: Pt is alert and answers all questions appropriately today. Seems to consider what OT is saying about rehab at SNF level.     General Comments       Exercises     Shoulder Instructions      Home Living Family/patient expects to be discharged to:: Private residence Living Arrangements: Children Available Help at Discharge: Family;Available PRN/intermittently (Patient reports that her daughter is using a walker and getting HHPT for her own issues; granddaughter is busy assisting her 57 y/o to medical appointments for cardiac issues. Son lives nearby and sometimes comes over to help pt up off the floor.) Type of Home: Apartment Home Access: Level entry     Home Layout: One level     Bathroom Shower/Tub: Tub/shower unit (sponge bathes)   Bathroom Toilet: Standard     Home Equipment: Rollator (4 wheels);Wheelchair - manual;BSC/3in1          Prior Functioning/Environment Prior Level of Function : History of Falls (last six months)  Mobility Comments: pt amb with rollator household distances, [facility] community distances; states that she has had at least 5 falls since prior admission 08/23/22. ADLs Comments: pt reports MOD I in ADL, assist for IADL as needed from family; uses rollator around the house.        OT Problem List: Decreased strength;Decreased activity tolerance;Impaired balance (sitting and/or standing);Decreased safety awareness;Decreased knowledge of use of DME or AE      OT Treatment/Interventions: Self-care/ADL training;Patient/family education;Therapeutic exercise;Therapeutic activities;Energy conservation;DME and/or AE instruction    OT Goals(Current goals can be found in the care plan section) Acute Rehab OT Goals Patient Stated Goal: Move to another place of residence that has  assistance OT Goal Formulation: With patient Time For Goal Achievement: 09/26/22 Potential to Achieve Goals: Good ADL Goals Pt Will Perform Lower Body Bathing: with modified independence;sit to/from stand Pt Will Perform Lower Body Dressing: with modified independence;sit to/from stand Pt Will Transfer to Toilet: with modified independence;bedside commode Pt Will Perform Toileting - Clothing Manipulation and hygiene: with modified independence;sit to/from stand  OT Frequency: Min 2X/week    Co-evaluation              AM-PAC OT "6 Clicks" Daily Activity     Outcome Measure Help from another person eating meals?: None Help from another person taking care of personal grooming?: None Help from another person toileting, which includes using toliet, bedpan, or urinal?: A Lot Help from another person bathing (including washing, rinsing, drying)?: A Lot Help from another person to put on and taking off regular upper body clothing?: None Help from another person to put on and taking off regular lower body clothing?: A Lot 6 Click Score: 18   End of Session Nurse Communication: Mobility status  Activity Tolerance: Patient limited by fatigue;Patient limited by pain Patient left: in bed;with bed alarm set  OT Visit Diagnosis: Unsteadiness on feet (R26.81);Muscle weakness (generalized) (M62.81)                Time: 1448-1856 OT Time Calculation (min): 46 min Charges:  OT General Charges $OT Visit: 1 Visit OT Evaluation $OT Eval Moderate Complexity: 1 Mod OT Treatments $Self Care/Home Management : 23-37 mins Waymon Amato, MS, OTR/L   Vania Rea 09/12/2022, 11:44 AM

## 2022-09-12 NOTE — Plan of Care (Signed)
  Problem: Education: Goal: Knowledge of condition and prescribed therapy will improve Outcome: Progressing   Problem: Metabolic: Goal: Ability to maintain appropriate glucose levels will improve Outcome: Progressing   Problem: Clinical Measurements: Goal: Respiratory complications will improve Outcome: Progressing Goal: Cardiovascular complication will be avoided Outcome: Progressing   Problem: Activity: Goal: Risk for activity intolerance will decrease Outcome: Progressing   Problem: Coping: Goal: Level of anxiety will decrease Outcome: Progressing

## 2022-09-12 NOTE — Progress Notes (Signed)
[facility] Progress Note  Patient: [name]    QPR:[id number]  DOA: 09/10/2022     Date of Service: the patient was seen and examined on 09/12/2022  Chief Complaint  Patient presents with   Leg Pain   Fall   Brief hospital course:  [name] is a 77 y.o. female with medical history significant of gait abnormality, history of acute respiratory failure with hypoxia, history of pneumonia, sleep apnea, right breast cancer treated with chemo and radiotherapy, chronic systolic heart failure, GERD, headache, hypertension, peripheral neuropathy who presented to the emergency department with acute on chronic bilateral lower extremity pain following a fall at home on 09/10/22.  ED course: Initial vital signs were temperature 97.8 F, pulse 94, respirations 16, BP 157/78 mmHg O2 sat 93% on room air.  She received vancomycin in the emergency department.   Lab work: CBC showed white count 12.2, hemoglobin 10.0 g/dL platelets 244.  Troponin was 33 then 43 ng/L.  BNP 113.1 pg/mL.  BMP showed a glucose of 119, BUN 30, creatinine 1.67 and calcium 8.8 mg/deciliter.  The rest of the electrolytes were normal.  Coronavirus, influenza and RSV PCR was negative.   Imaging: CT head without contrast with no evidence of intracranial injury. Bilateral tibia/fibula with chronic osteoarthritis of the knees and ankle.  No acute or focal findings of the remainder of the bilateral tibia and fibula.   Assessment and Plan:  Cellulitis of lower extremity S/p IV fluids, no more need.  Continue oral hydration Continue cefepime 2 g every 8 hours.   Continue vancomycin per pharmacy. Continue local care. Blood glucose control. Follow CBC and CMP in a.m.    AKI on CKD stage IIIb, 1.67--1.2 gradually improving Continue oral hydration and monitor renal function daily   Essential hypertension Continue losartan 25 mg p.o. daily. Continue clonidine 0.1 mg p.o. twice daily as needed. She is also on Imdur 60  mg p.o. twice daily. Use as needed hydralazine and Lopressor  Chronic systolic CHF (congestive heart failure) (HCC) Continue losartan and isosorbide as above.   OSA (obstructive sleep apnea) CPAP at bedtime.   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 daily.   PAD (peripheral artery disease) (HCC) Continue aspirin 81 mg p.o. daily. Might benefit from statin.   Class II obesity Lifestyle modifications.   Chronic kidney disease, stage 3b ([place]) Monitor renal function electrolytes.   Glucose intolerance Carbohydrate modified diet. Continue Jardiance 10 mg p.o. daily. CBG monitoring before meals and bedtime. Begin RI SS if glucose trends upwards. Check hemoglobin A1c.  Right renal cyst, incidental finding CT A/P: 2 cm exophytic lesion in the midportion of right kidney with density measurements higher than usual for simple cysts. This may suggest hemorrhagic or proteinaceous cyst or solid lesion. Follow-up multiphasic CT or MRI may be considered. Recommended to follow with PCP as an outpatient.  Severe osteoarthritis of bilateral knee joints X-ray reviewed, no significant joint effusion Follow-up with orthopedic surgery as an outpatient PT and OT eval done, recommend SNF placement   Constipation, started MiraLAX and Dulcolax. CT a/p rule out any obstruction. 1/5 mineral oil enema ordered  Body mass index is 35.88 kg/m.  Interventions:       Diet: Heart healthy/carb modified DVT Prophylaxis: Subcutaneous Lovenox   Advance goals of care discussion: Full code  Family Communication: family was NOT present at bedside, at the time of interview.  The pt provided permission to discuss medical plan with the family. Opportunity  was given to ask question and all questions were answered satisfactorily.   Disposition:  Pt is from Home, admitted with Fall and LE cellulitis, on IV antibiotics, PT/OT eval recommended SNF placement.  Clinically  stable Discharge to SNF when bed available.  TOC following for placement  Subjective: No significant events overnight, complaining of some drainage from the left leg, still has not moved bowels, no abdominal pain, no nausea vomiting. Denies any chest pain or palpitation, no shortness of breath.  Patient was resting comfortably. Patient did agree for the enema.   Physical Exam: General:  alert oriented to time, place, and person.  Appear in mild distress, affect appropriate Eyes: PERRLA ENT: Oral Mucosa Clear, moist  Neck: no JVD,  Cardiovascular: S1 and S2 Present, no Murmur,  Respiratory: good respiratory effort, Bilateral Air entry equal and Decreased, no Crackles, no wheezes Abdomen: Bowel Sound present, Soft and no tenderness,  Skin: no rashes Extremities: no significant pedal edema, no calf tenderness, chronic pigmentation, possible cellulitis left lower extremity Neurologic: without any new focal findings Gait not checked due to patient safety concerns  Vitals:   09/11/22 2023 09/12/22 0524 09/12/22 0832 09/12/22 1507  BP: (!) 151/65 139/62 (!) 150/68 (!) 153/69  Pulse: 75 74 71 66  Resp: '18 18 17 20  '$ Temp: 98.6 F (37 C) 99.5 F (37.5 C) 98.8 F (37.1 C) 98.3 F (36.8 C)  TempSrc: Oral Oral    SpO2: 100% 97% 99% 97%  Weight:      Height:        Intake/Output Summary (Last 24 hours) at 09/12/2022 1709 Last data filed at 09/12/2022 1419 Gross per 24 hour  Intake 871.14 ml  Output --  Net 871.14 ml   Filed Weights   09/09/22 2031  Weight: 110.2 kg    Data Reviewed: I have personally reviewed and interpreted daily labs, tele strips, imagings as discussed above. I reviewed all nursing notes, pharmacy notes, vitals, pertinent old records I have discussed plan of care as described above with RN and patient/family.  CBC: Recent Labs  Lab 09/09/22 2039 09/11/22 0420  WBC 12.2* 10.5  HGB 10.0* 8.7*  HCT 32.9* 28.3*  MCV 87.3 87.6  PLT 244 329   Basic  Metabolic Panel: Recent Labs  Lab 09/09/22 2039 09/11/22 0420  NA 137 142  K 3.8 4.0  CL 106 112*  CO2 22 22  GLUCOSE 119* 78  BUN 30* 23  CREATININE 1.67* 1.20*  CALCIUM 8.8* 8.2*    Studies: No results found.  Scheduled Meds:  anastrozole  1 mg Oral Daily   aspirin EC  81 mg Oral Daily   bisacodyl  5 mg Oral QHS   empagliflozin  10 mg Oral Daily   enoxaparin (LOVENOX) injection  0.5 mg/kg Subcutaneous Q24H   isosorbide mononitrate  60 mg Oral Daily   loratadine  10 mg Oral Daily   losartan  25 mg Oral Daily   oxybutynin  5 mg Oral BID   pantoprazole  40 mg Oral Daily   polyethylene glycol  17 g Oral BID   Continuous Infusions:  sodium chloride 20 mL/hr at 09/12/22 1419   ceFEPime (MAXIPIME) IV 2 g (09/12/22 1421)   vancomycin Stopped (09/12/22 1340)   PRN Meds: sodium chloride, acetaminophen **OR** acetaminophen, bisacodyl, cloNIDine, docusate sodium, hydrALAZINE, metoprolol tartrate, ondansetron **OR** ondansetron (ZOFRAN) IV  Time spent: 40 minutes  Author: Val Riles. MD [facility] 09/12/2022 5:09 PM  To reach On-call, see care teams to  locate the attending and reach out to them via [email or web address]. If 7PM-7AM, please contact night-coverage If you still have difficulty reaching the attending provider, please page the [facility] (Director on Call) for [facility] on amion for assistance.

## 2022-09-12 NOTE — Care Management Important Message (Signed)
Important Message  Patient Details  Name: [name] MRN: [id number] Date of Birth: 1945/12/14   Medicare Important Message Given:  N/A - LOS <3 / Initial given by admissions     Juliann Pulse A Lawsen Arnott 09/12/2022, 8:55 AM

## 2022-09-12 NOTE — NC FL2 (Signed)
[place] LEVEL OF CARE FORM     IDENTIFICATION  Patient Name: [name] Birthdate: 09/25/1945 Sex: female Admission Date (Current Location): 09/10/2022  [facility] and [place] Number:  Engineering geologist and Address:  [facility], [address] [place], [place] [address]      Provider Number: [id number]  Attending Physician Name and Address:  Val Riles, MD  Relative Name and Phone Number:       Current Level of Care: Hospital Recommended Level of Care: [place] Prior Approval Number:    Date Approved/Denied:   PASRR Number: [id number]  Discharge Plan: SNF    Current Diagnoses: Patient Active Problem List   Diagnosis Date Noted   Cellulitis of lower extremity 09/10/2022   Chronic kidney disease, stage 3b ([place]) 09/10/2022   Glucose intolerance 09/10/2022   Near syncope 08/22/2022   Constipation 08/22/2022   Pyuria 08/22/2022   Stage 3a chronic kidney disease ([place]) 06/13/2022   Chest pain 06/13/2022   Hypertensive emergency 06/13/2022   Syncope 03/03/2022   AKI (acute kidney injury) ([place]) 03/03/2022   Weakness 03/03/2022   Ulcers of both lower extremities, with unspecified severity ([place]) 03/03/2022   Class II obesity 03/03/2022   Anemia of chronic disease 03/03/2022   Lower limb ulcer, calf, left, limited to breakdown of skin ([place]) 12/11/2020   PAD (peripheral artery disease) ([place]) 05/30/2020   Malignant neoplasm of right female breast ([place]) 05/13/2018   Venous insufficiency of both lower extremities 11/20/2017   Acute on chronic diastolic CHF (congestive heart failure) ([place]) 11/20/2017   Weight gain 11/20/2017   Lymphedema 04/19/2017   Benign essential HTN 04/19/2017   OSA (obstructive sleep apnea) 04/19/2017   Morbid obesity ([place]) 04/19/2017   TIA (transient ischemic attack) 03/28/2017   Urinary incontinence 82/80/0349   Chronic systolic CHF (congestive heart failure) ([place]) 04/20/2013    Essential hypertension 07/16/2011   CHF (congestive heart failure) ([place]) 07/15/2011   Shortness of breath 07/15/2011   Edema 07/15/2011   Breast cancer ([place]) 09/08/2004    Orientation RESPIRATION BLADDER Height & Weight     Self, Time, Situation, Place  O2, Other (Comment) (Nasal Cannula 2 L. Wearing CPAP at night here but does not wear at home.) Incontinent, External catheter Weight: 243 lb (110.2 kg) Height:  '5\' 9"'$  (175.3 cm)  BEHAVIORAL SYMPTOMS/MOOD NEUROLOGICAL BOWEL NUTRITION STATUS   (None)  (None) Continent Diet (Heart healthy/carb modified. Fluid restriction: 1800 mL.)  AMBULATORY STATUS COMMUNICATION OF NEEDS Skin   Extensive Assist Verbally Other (Comment) (Blister, erythema/redness, weeping.)                       Personal Care Assistance Level of Assistance  Bathing, Feeding, Dressing Bathing Assistance: Maximum assistance Feeding assistance: Limited assistance Dressing Assistance: Maximum assistance     Functional Limitations Info  Sight, Hearing, Speech Sight Info: Adequate Hearing Info: Adequate Speech Info: Adequate    SPECIAL CARE FACTORS FREQUENCY  PT (By licensed PT), OT (By licensed OT)     PT Frequency: 5 x week OT Frequency: 5 x week            Contractures Contractures Info: Not present    Additional Factors Info  Code Status, Allergies Code Status Info: Full code Allergies Info: NKDA           Current Medications (09/12/2022):  This is the current hospital active medication list Current Facility-Administered Medications  Medication Dose Route Frequency Provider Last Rate  Last Admin   0.9 %  sodium chloride infusion   Intravenous PRN Val Riles, MD 10 mL/hr at 09/12/22 1239 New Bag at 09/12/22 1239   acetaminophen (TYLENOL) tablet 650 mg  650 mg Oral Q6H PRN Reubin Milan, MD   650 mg at 09/12/22 1233   Or   acetaminophen (TYLENOL) suppository 650 mg  650 mg Rectal Q6H PRN Reubin Milan, MD       anastrozole  (ARIMIDEX) tablet 1 mg  1 mg Oral Daily Reubin Milan, MD   1 mg at 09/12/22 1038   aspirin EC tablet 81 mg  81 mg Oral Daily Reubin Milan, MD   81 mg at 09/12/22 1038   bisacodyl (DULCOLAX) EC tablet 5 mg  5 mg Oral QHS Val Riles, MD   5 mg at 09/11/22 2148   bisacodyl (DULCOLAX) suppository 10 mg  10 mg Rectal Daily PRN Val Riles, MD       ceFEPIme (MAXIPIME) 2 g in sodium chloride 0.9 % 100 mL IVPB  2 g Intravenous Q12H Lorin Picket, [place] at 09/12/22 0303   cloNIDine (CATAPRES) tablet 0.1 mg  0.1 mg Oral BID PRN Reubin Milan, MD       docusate sodium (COLACE) capsule 100 mg  100 mg Oral BID PRN Reubin Milan, MD       empagliflozin (JARDIANCE) tablet 10 mg  10 mg Oral Daily Reubin Milan, MD   10 mg at 09/12/22 1039   enoxaparin (LOVENOX) injection 55 mg  0.5 mg/kg Subcutaneous Q24H Reubin Milan, MD   55 mg at 09/11/22 2147   hydrALAZINE (APRESOLINE) injection 10 mg  10 mg Intravenous Q6H PRN Val Riles, MD   10 mg at 09/11/22 0956   isosorbide mononitrate (IMDUR) 24 hr tablet 60 mg  60 mg Oral Daily Reubin Milan, MD   60 mg at 09/12/22 1038   loratadine (CLARITIN) tablet 10 mg  10 mg Oral Daily Reubin Milan, MD   10 mg at 09/12/22 1039   losartan (COZAAR) tablet 25 mg  25 mg Oral Daily Reubin Milan, MD   25 mg at 09/12/22 1039   metoprolol tartrate (LOPRESSOR) injection 5 mg  5 mg Intravenous Q6H PRN Val Riles, MD       ondansetron [facility]) tablet 4 mg  4 mg Oral Q6H PRN Reubin Milan, MD       Or   ondansetron [facility]) injection 4 mg  4 mg Intravenous Q6H PRN Reubin Milan, MD   4 mg at 09/11/22 0930   oxybutynin (DITROPAN) tablet 5 mg  5 mg Oral BID Reubin Milan, MD   5 mg at 09/12/22 1039   pantoprazole (PROTONIX) EC tablet 40 mg  40 mg Oral Daily Reubin Milan, MD   40 mg at 09/12/22 1038   polyethylene glycol (MIRALAX / GLYCOLAX) packet 17 g  17 g Oral BID Val Riles, MD    17 g at 09/12/22 1038   vancomycin (VANCOCIN) IVPB 1000 mg/200 mL premix  1,000 mg Intravenous Q24H Lorin Picket, RPH 200 mL/hr at 09/12/22 1240 1,000 mg at 09/12/22 1240     Discharge Medications: Please see discharge summary for a list of discharge medications.  Relevant Imaging Results:  Relevant Lab Results:   Additional Information SS#: [id number].  Candie Chroman, LCSW

## 2022-09-12 NOTE — Plan of Care (Signed)
  Problem: Education: Goal: Knowledge of condition and prescribed therapy will improve Outcome: Progressing

## 2022-09-12 NOTE — TOC Initial Note (Addendum)
Transition of Care [facility]) - Initial/Assessment Note    Patient Details  Name: [name] MRN: [id number] Date of Birth: March 20, 1946  [facility] CM/SW Contact:    Candie Chroman, LCSW Phone Number: 09/12/2022, 11:11 AM  Clinical Narrative:   CSW met with patient. No supports at bedside. CSW introduced role and explained that discharge planning would be discussed. Received secure chat from OT that they are going to recommend SNF placement. PT evaluation is still pending but patient is agreeable if they recommend it as well. Patient is also interested in looking for an apartment to move into on her own. She currently lives with her daughter who just had a baby and does not want to be a burden on them. Patient confirmed she feels safe at home with her daughter and does not feel like she is being taken advantage of. Will start SNF workup if PT recommends it. No further concerns. CSW encouraged patient to contact CSW as needed. CSW will continue to follow patient for support and facilitate discharge once medically stable.             2:06 pm: SNF referral sent out.    Expected Discharge Plan: Skilled Nursing Facility Barriers to Discharge: Continued Medical Work up   Patient Goals and CMS Choice            Expected Discharge Plan and Services     Post Acute Care Choice: [place] Living arrangements for the past 2 months: Apartment                                      Prior Living Arrangements/Services Living arrangements for the past 2 months: Apartment Lives with:: Adult Children Patient language and need for interpreter reviewed:: Yes Do you feel safe going back to the place where you live?: Yes      Need for Family Participation in Patient Care: Yes (Comment) Care giver support system in place?: Yes (comment)   Criminal Activity/Legal Involvement Pertinent to Current Situation/Hospitalization: No - Comment as needed  Activities of  Daily Living Home Assistive Devices/Equipment: Gilford Rile (specify type) ADL Screening (condition at time of admission) Patient's cognitive ability adequate to safely complete daily activities?: Yes Is the patient deaf or have difficulty hearing?: No Does the patient have difficulty seeing, even when wearing glasses/contacts?: No Does the patient have difficulty concentrating, remembering, or making decisions?: No Patient able to express need for assistance with ADLs?: Yes Does the patient have difficulty dressing or bathing?: No Independently performs ADLs?: Yes (appropriate for developmental age) Does the patient have difficulty walking or climbing stairs?: Yes Weakness of Legs: Both Weakness of Arms/Hands: None  Permission Sought/Granted Permission sought to share information with : Facility Art therapist granted to share information with : Yes, Verbal Permission Granted     Permission granted to share info w AGENCY: SNF's        Emotional Assessment Appearance:: Appears stated age Attitude/Demeanor/Rapport: Engaged, Gracious Affect (typically observed): Accepting, Appropriate, Calm, Pleasant Orientation: : Oriented to Self, Oriented to Place, Oriented to  Time, Oriented to Situation Alcohol / Substance Use: Not Applicable Psych Involvement: No (comment)  Admission diagnosis:  Generalized weakness [R53.1] Fall, initial encounter [W19.XXXA] Cellulitis of lower extremity [L03.119] Cellulitis of lower extremity, unspecified laterality [L03.119] Patient Active Problem List   Diagnosis Date Noted   Cellulitis of lower extremity 09/10/2022   Chronic kidney  disease, stage 3b ([place]) 09/10/2022   Glucose intolerance 09/10/2022   Near syncope 08/22/2022   Constipation 08/22/2022   Pyuria 08/22/2022   Stage 3a chronic kidney disease ([place]) 06/13/2022   Chest pain 06/13/2022   Hypertensive emergency 06/13/2022   Syncope 03/03/2022   AKI (acute kidney injury) ([place])  03/03/2022   Weakness 03/03/2022   Ulcers of both lower extremities, with unspecified severity ([place]) 03/03/2022   Class II obesity 03/03/2022   Anemia of chronic disease 03/03/2022   Lower limb ulcer, calf, left, limited to breakdown of skin ([place]) 12/11/2020   PAD (peripheral artery disease) ([place]) 05/30/2020   Malignant neoplasm of right female breast ([place]) 05/13/2018   Venous insufficiency of both lower extremities 11/20/2017   Acute on chronic diastolic CHF (congestive heart failure) ([place]) 11/20/2017   Weight gain 11/20/2017   Lymphedema 04/19/2017   Benign essential HTN 04/19/2017   OSA (obstructive sleep apnea) 04/19/2017   Morbid obesity ([place]) 04/19/2017   TIA (transient ischemic attack) 03/28/2017   Urinary incontinence 35/70/1779   Chronic systolic CHF (congestive heart failure) ([place]) 04/20/2013   Essential hypertension 07/16/2011   CHF (congestive heart failure) ([place]) 07/15/2011   Shortness of breath 07/15/2011   Edema 07/15/2011   Breast cancer ([place]) 09/08/2004   PCP:  Donnie Coffin, MD Pharmacy:   [id number] [place] - [address]: [phone]: [address]Phone: [phone]: [phone] [id number] [address]: [phone]: [phone]), [place] - [address] [address]one: [phone]: [phone]   Social Determinants of Health (SDOH) Social History: SDLeachNo Food Insecurity (09/11/2022)  Housing: Low Risk  (09/11/2022)  Transportation Needs: No Transportation Needs (09/11/2022)  Utilities: Not At Risk (09/11/2022)  Tobacco Use: Medium Risk (09/09/2022)    SDOH Interventions:     Readmission Risk Interventions    09/12/2022   11:09 AM  Readmission Risk Prevention Plan  PCP or Specialist Appt within 3-5 Days Complete  Social Work Consult for [place]/Counseling Complete  Palliative Care Screening Not Applicable

## 2022-09-12 NOTE — Evaluation (Signed)
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  MD assessment includes: Cellulitis of lower extremity, AKI on CKD, HTN, and severe OA of bilateral knee joints.   Clinical Impression  Pt was pleasant and motivated to participate during the session and put forth good effort throughout. Pt required physical assistance with bed mobility tasks but presented with good static sitting balance once in sitting.  Pt required extensive assistance with sit to/from stand transfer and was unable to come to full upright standing or to advance either LE once in standing.  Pt has an extensive recent fall history and limited assistance at home.  Pt will benefit from PT services in a SNF setting upon discharge to safely address deficits listed in patient problem list for decreased caregiver assistance and eventual return to PLOF.          Recommendations for follow up therapy are one component of a multi-disciplinary discharge planning process, led by the attending physician.  Recommendations may be updated based on patient status, additional functional criteria and insurance authorization.  Follow Up Recommendations Skilled nursing-short term rehab (<3 hours/day) Can patient physically be transported by private vehicle: No    Assistance Recommended at Discharge Frequent or constant Supervision/Assistance  Patient can return home with the following  Two people to help with walking and/or transfers;A lot of help with bathing/dressing/bathroom;Assistance with cooking/housework;Direct supervision/assist for medications management;Assist for transportation    Equipment  Recommendations Rolling walker (2 wheels)  Recommendations for Other Services       Functional Status Assessment Patient has had a recent decline in their functional status and/or demonstrates limited ability to make significant improvements in function in a reasonable and predictable amount of time     Precautions / Restrictions Precautions Precautions: Fall Restrictions Weight Bearing Restrictions: No      Mobility  Bed Mobility Overal bed mobility: Needs Assistance Bed Mobility: Supine to Sit, Sit to Supine     Supine to sit: Min assist Sit to supine: Max assist   General bed mobility comments: Significant time and effort during sup to sit with min A for trunk control, max A for BLE control during sit to sup    Transfers Overall transfer level: Needs assistance Equipment used: Rolling walker (2 wheels) Transfers: Sit to/from Stand Sit to Stand: Max assist, From elevated surface           General transfer comment: Pt able to clear the surface of the bed during sit to/from stand but unable to come to full upright standing position; mod verbal cues for sequencing    Ambulation/Gait               General Gait Details: Pt unable to advance either LE  Stairs            Wheelchair Mobility    Modified Rankin (Stroke Patients Only)       Balance Overall balance assessment: Needs assistance, History of Falls Sitting-balance support: Feet supported Sitting balance-Leahy Scale: Good     Standing balance support: Bilateral upper extremity supported, Reliant on assistive device for balance Standing balance-Leahy Scale: Poor Standing balance comment: heavy lean on BUE during partial standing  Pertinent Vitals/Pain Pain Assessment Pain Assessment: 0-10 Pain Score: 9  Pain Location: Bilateral knees Pain Descriptors / Indicators: Aching, Sore, Grimacing Pain Intervention(s): Patient requesting pain meds-RN  notified, Repositioned, Monitored during session    Home Living Family/patient expects to be discharged to:: Private residence Living Arrangements: Children Available Help at Discharge: Family;Available PRN/intermittently Type of Home: Apartment Home Access: Level entry       Home Layout: One level Home Equipment: Rollator (4 wheels);Wheelchair - manual;BSC/3in1      Prior Function Prior Level of Function : History of Falls (last six months);Independent/Modified Independent             Mobility Comments: Mod Ind amb limitd household distances with a rollator, wheelchair for community access, 5 falls in the last month ADLs Comments: MOD I in ADL, assist for IADL as needed from family     Hand Dominance   Dominant Hand: Right    Extremity/Trunk Assessment   Upper Extremity Assessment Upper Extremity Assessment: Generalized weakness    Lower Extremity Assessment Lower Extremity Assessment: Generalized weakness       Communication   Communication: No difficulties  Cognition Arousal/Alertness: Awake/alert Behavior During Therapy: WFL for tasks assessed/performed Overall Cognitive Status: Within Functional Limits for tasks assessed                                          General Comments      Exercises Total Joint Exercises Ankle Circles/Pumps: AROM, Strengthening, Both, 10 reps Quad Sets: Strengthening, Both, 10 reps Gluteal Sets: Strengthening, Both, 10 reps Short Arc Quad: Strengthening, Both, 5 reps Hip ABduction/ADduction: AAROM, Strengthening, Both, 5 reps Straight Leg Raises: AAROM, Strengthening, Both, 5 reps Long Arc Quad: AROM, Strengthening, Both, 10 reps, 15 reps Knee Flexion: AROM, Strengthening, Both, 10 reps, 15 reps Other Exercises Other Exercises: HEP education for BLE APs, QS, GS, and LAQs x 10 each 5-6x/day   Assessment/Plan    PT Assessment Patient needs continued PT services  PT Problem List Decreased  strength;Decreased activity tolerance;Decreased balance;Decreased mobility;Decreased knowledge of use of DME;Pain       PT Treatment Interventions DME instruction;Balance training;Gait training;Functional mobility training;Patient/family education;Therapeutic activities;Therapeutic exercise    PT Goals (Current goals can be found in the Care Plan section)  Acute Rehab PT Goals Patient Stated Goal: To get stronger PT Goal Formulation: With patient Time For Goal Achievement: 09/25/22 Potential to Achieve Goals: Fair    Frequency Min 2X/week     Co-evaluation               AM-PAC PT "6 Clicks" Mobility  Outcome Measure Help needed turning from your back to your side while in a flat bed without using bedrails?: A Little Help needed moving from lying on your back to sitting on the side of a flat bed without using bedrails?: A Lot Help needed moving to and from a bed to a chair (including a wheelchair)?: Total Help needed standing up from a chair using your arms (e.g., wheelchair or bedside chair)?: A Lot Help needed to walk in hospital room?: Total Help needed climbing 3-5 steps with a railing? : Total 6 Click Score: 10    End of Session Equipment Utilized During Treatment: Gait belt Activity Tolerance: Patient limited by pain Patient left: in bed;with call bell/phone within reach;with nursing/sitter in room;with bed alarm set Nurse Communication: Mobility status PT Visit Diagnosis: Unsteadiness  on feet (R26.81);History of falling (Z91.81);Difficulty in walking, not elsewhere classified (R26.2);Muscle weakness (generalized) (M62.81);Pain Pain - Right/Left:  (bilateral) Pain - part of body: Knee    Time: 9485-4627 PT Time Calculation (min) (ACUTE ONLY): 35 min   Charges:   PT Evaluation $PT Eval Moderate Complexity: 1 Mod PT Treatments $Therapeutic Exercise: 8-22 mins       D. Royetta Asal PT, DPT 09/12/22, 12:25 PM

## 2022-09-12 NOTE — Consult Note (Addendum)
[facility] Nurse Consult Note: Reason for Consult: Consult requested for bilat leg wraps.  Performed remotely after review of progress notes in the EMR.  Pt is familiar to [facility] team from a previous admission.  She wears compression to BLE to reduce edema and cellulitis.  Dressing procedure/placement/frequency: Topical treatment orders provided for bedside nurses to perform as follows: Apply Xeroform gauze to bilat legs Q day and cover with ABD pads and then kerlex , beginning just behind toes to below knees, then ace wrap in a spiral fashion.  Please re-consult if further assistance is needed.  Thank-you,  Julien Girt MSN, [place], [place], [place], [place]

## 2022-09-12 NOTE — Consult Note (Signed)
Pharmacy Antibiotic Note  [name] is a 77 y.o. female admitted on 09/10/2022 with cellulitis.  Pharmacy has been consulted for Vancomycin and cefepime dosing.  Assessment: 78 yo F presents to ED with leg wounds, unable to bear weight.  WBC 10.5, Tmax 99.5, Scr 1.2  Plan: Continue vancomycin to 1000 mg IV every 24 hours Estimated AUC 466.3, Cmin 12.2 Continue Cefepime 2 grams IV every 12 hours Vancomycin levels as clinically appropriate Monitor renal function for needed adjustments  Height: '5\' 9"'$  (175.3 cm) Weight: 110.2 kg (243 lb) IBW/kg (Calculated) : 66.2  Temp (24hrs), Avg:98.9 F (37.2 C), Min:98.5 F (36.9 C), Max:99.5 F (37.5 C)  Recent Labs  Lab 09/09/22 2039 09/11/22 0420  WBC 12.2* 10.5  CREATININE 1.67* 1.20*     Estimated Creatinine Clearance: 52.8 mL/min (A) (by C-G formula based on SCr of 1.2 mg/dL (H)).    No Known Allergies  Antimicrobials this admission: Vancomycin 1/3 >>  Cefepime 1/3 >>   Dose adjustments this admission: 1/4 Vancomycin 1250 mg IV Q36h to 1000 mg IV Q24h  Microbiology results: N/A  Thank you for allowing pharmacy to be a part of this patient's care.  Lorin Picket, PharmD 09/12/2022 2:57 PM

## 2022-09-13 DIAGNOSIS — L03119 Cellulitis of unspecified part of limb: Secondary | ICD-10-CM | POA: Diagnosis not present

## 2022-09-13 LAB — PHOSPHORUS: Phosphorus: 2.6 mg/dL (ref 2.5–4.6)

## 2022-09-13 LAB — CBC
HCT: 28.1 % — ABNORMAL LOW (ref 36.0–46.0)
Hemoglobin: 8.8 g/dL — ABNORMAL LOW (ref 12.0–15.0)
MCH: 26.8 pg (ref 26.0–34.0)
MCHC: 31.3 g/dL (ref 30.0–36.0)
MCV: 85.7 fL (ref 80.0–100.0)
Platelets: 214 10*3/uL (ref 150–400)
RBC: 3.28 MIL/uL — ABNORMAL LOW (ref 3.87–5.11)
RDW: 14.5 % (ref 11.5–15.5)
WBC: 8.1 10*3/uL (ref 4.0–10.5)
nRBC: 0 % (ref 0.0–0.2)

## 2022-09-13 LAB — BASIC METABOLIC PANEL
Anion gap: 6 (ref 5–15)
BUN: 13 mg/dL (ref 8–23)
CO2: 23 mmol/L (ref 22–32)
Calcium: 8.4 mg/dL — ABNORMAL LOW (ref 8.9–10.3)
Chloride: 108 mmol/L (ref 98–111)
Creatinine, Ser: 0.91 mg/dL (ref 0.44–1.00)
GFR, Estimated: >60 mL/min (ref >60)
Glucose, Bld: 84 mg/dL (ref 70–99)
Potassium: 3.7 mmol/L (ref 3.5–5.1)
Sodium: 137 mmol/L (ref 135–145)

## 2022-09-13 LAB — LIPID PANEL
Cholesterol: 134 mg/dL (ref 0–200)
HDL: 37 mg/dL — ABNORMAL LOW (ref >40)
LDL Cholesterol: 84 mg/dL (ref 0–99)
Total CHOL/HDL Ratio: 3.6 RATIO
Triglycerides: 66 mg/dL (ref <150)
VLDL: 13 mg/dL (ref 0–40)

## 2022-09-13 LAB — GLUCOSE, CAPILLARY
Glucose-Capillary: 81 mg/dL (ref 70–99)
Glucose-Capillary: 137 mg/dL — ABNORMAL HIGH (ref 70–99)
Glucose-Capillary: 121 mg/dL — ABNORMAL HIGH (ref 70–99)
Glucose-Capillary: 105 mg/dL — ABNORMAL HIGH (ref 70–99)

## 2022-09-13 LAB — MAGNESIUM: Magnesium: 1.9 mg/dL (ref 1.7–2.4)

## 2022-09-13 MED ORDER — BISACODYL 10 MG RE SUPP
10.0000 mg | Freq: Two times a day (BID) | RECTAL | Status: AC
  Administered 2022-09-13: 10 mg via RECTAL
  Filled 2022-09-13 (×2): qty 1

## 2022-09-13 MED ORDER — BISACODYL 10 MG RE SUPP: 10.0000 mg | Freq: Two times a day (BID) | RECTAL | Status: DC

## 2022-09-13 MED ORDER — DOXYCYCLINE HYCLATE 100 MG PO TABS
100.0000 mg | ORAL_TABLET | Freq: Two times a day (BID) | ORAL | Status: DC
  Administered 2022-09-13 – 2022-09-16 (×6): 100 mg via ORAL
  Filled 2022-09-13 (×6): qty 1

## 2022-09-13 MED ORDER — CIPROFLOXACIN HCL 500 MG PO TABS
500.0000 mg | ORAL_TABLET | Freq: Two times a day (BID) | ORAL | Status: DC
  Administered 2022-09-13 – 2022-09-16 (×6): 500 mg via ORAL
  Filled 2022-09-13 (×6): qty 1

## 2022-09-13 MED ORDER — MINERAL OIL RE ENEM: 1.0000 | ENEMA | Freq: Once | RECTAL | Status: DC

## 2022-09-13 MED ORDER — FLEET ENEMA 7-19 GM/118ML RE ENEM: 1.0000 | ENEMA | Freq: Once | RECTAL | Status: DC

## 2022-09-13 NOTE — Progress Notes (Addendum)
[facility] Progress Note  Patient: [name]    STM:[id number]  DOA: 09/10/2022     Date of Service: the patient was seen and examined on 09/13/2022  Chief Complaint  Patient presents with   Leg Pain   Fall   Brief hospital course:  Jadelynn Boylan is a 77 y.o. female with medical history significant of gait abnormality, history of acute respiratory failure with hypoxia, history of pneumonia, sleep apnea, right breast cancer treated with chemo and radiotherapy, chronic systolic heart failure, GERD, headache, hypertension, peripheral neuropathy who presented to the emergency department with acute on chronic bilateral lower extremity pain following a fall at home on 09/10/22.  ED course: Initial vital signs were temperature 97.8 F, pulse 94, respirations 16, BP 157/78 mmHg O2 sat 93% on room air.  She received vancomycin in the emergency department.   Lab work: CBC showed white count 12.2, hemoglobin 10.0 g/dL platelets 244.  Troponin was 33 then 43 ng/L.  BNP 113.1 pg/mL.  BMP showed a glucose of 119, BUN 30, creatinine 1.67 and calcium 8.8 mg/deciliter.  The rest of the electrolytes were normal.  Coronavirus, influenza and RSV PCR was negative.   Imaging: CT head without contrast with no evidence of intracranial injury. Bilateral tibia/fibula with chronic osteoarthritis of the knees and ankle.  No acute or focal findings of the remainder of the bilateral tibia and fibula.   Assessment and Plan:  Cellulitis of lower extremity S/p IV fluids, no more need.  Continue oral hydration S/p cefepime 2 g every 8 hour and vancomycin as per pharmacy 1/6 changed to p.o. antibiotics Cipro and doxycycline to cover Pseudomonas and MRSA as well.  Total antibiotics for 10 days duration. Continue local care. Blood glucose control.    AKI on CKD stage IIIb, 1.67--1.2 --0.91 AKI resolved Continue oral hydration and monitor renal function daily   Essential hypertension Continue losartan 25 mg  p.o. daily. Continue clonidine 0.1 mg p.o. twice daily as needed. She is also on Imdur 60 mg p.o. twice daily. Use as needed hydralazine and Lopressor  Chronic systolic CHF (congestive heart failure) (HCC) Continue losartan and isosorbide as above.   OSA (obstructive sleep apnea) CPAP at bedtime.   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 daily.   PAD (peripheral artery disease) (HCC) Continue aspirin 81 mg p.o. daily. Might benefit from statin. Check lipid panel   Class II obesity Lifestyle modifications.   Chronic kidney disease, stage 3b ([place]) Monitor renal function electrolytes.   Prediabetic, hemoglobin A1c 6.3 Continue diabetic diet Monitor FSBG Continue Jardiance 10 mg p.o. daily. Continue NovoLog sliding scale   Right renal cyst, incidental finding CT A/P: 2 cm exophytic lesion in the midportion of right kidney with density measurements higher than usual for simple cysts. This may suggest hemorrhagic or proteinaceous cyst or solid lesion. Follow-up multiphasic CT or MRI may be considered. Recommended to follow with PCP as an outpatient.  Severe osteoarthritis of bilateral knee joints X-ray reviewed, no significant joint effusion Follow-up with orthopedic surgery as an outpatient PT and OT eval done, recommend SNF placement   Constipation, started MiraLAX and Dulcolax. CT a/p rule out any obstruction. 1/5 mineral oil enema given 1/6 soapsuds enema was given, Dulcolax suppository was also given and then patient moved bowels.   Body mass index is 35.88 kg/m.  Interventions:       Diet: Heart healthy/carb modified DVT Prophylaxis: Subcutaneous Lovenox   Advance goals of care discussion: Full  code  Family Communication: family was NOT present at bedside, at the time of interview.  The pt provided permission to discuss medical plan with the family. Opportunity was given to ask question and all questions were answered  satisfactorily.   Disposition:  Pt is from Home, admitted with Fall and LE cellulitis, s/p IV antibiotics, PT/OT eval recommended SNF placement.  Clinically stable Discharge to SNF when bed available.  TOC following for placement  Subjective: No significant events overnight, patient did not move bowels, so patient was given suppository which helped and patient moved bowels as per RN. Patient denied any abdominal pain, no nausea vomiting.   Physical Exam: General:  alert oriented to time, place, and person.  Appear in mild distress, affect appropriate Eyes: PERRLA ENT: Oral Mucosa Clear, moist  Neck: no JVD,  Cardiovascular: S1 and S2 Present, no Murmur,  Respiratory: good respiratory effort, Bilateral Air entry equal and Decreased, no Crackles, no wheezes Abdomen: Bowel Sound present, Soft and no tenderness,  Skin: no rashes Extremities: no significant pedal edema, no calf tenderness, chronic pigmentation, possible cellulitis left lower extremity, dressing CDI Neurologic: without any new focal findings Gait not checked due to patient safety concerns  Vitals:   09/12/22 1507 09/12/22 2031 09/13/22 0517 09/13/22 0805  BP: (!) 153/69 (!) 154/81 (!) 160/77 (!) 160/81  Pulse: 66 72 72 71  Resp: '20 16 17 18  '$ Temp: 98.3 F (36.8 C) 99.8 F (37.7 C) 98.2 F (36.8 C) 99.2 F (37.3 C)  TempSrc:  Oral  Oral  SpO2: 97% 97% 96% 95%  Weight:      Height:        Intake/Output Summary (Last 24 hours) at 09/13/2022 1508 Last data filed at 09/13/2022 1500 Gross per 24 hour  Intake 566.24 ml  Output 750 ml  Net -183.76 ml   Filed Weights   09/09/22 2031  Weight: 110.2 kg    Data Reviewed: I have personally reviewed and interpreted daily labs, tele strips, imagings as discussed above. I reviewed all nursing notes, pharmacy notes, vitals, pertinent old records I have discussed plan of care as described above with RN and patient/family.  CBC: Recent Labs  Lab 09/09/22 2039  09/11/22 0420 09/13/22 0511  WBC 12.2* 10.5 8.1  HGB 10.0* 8.7* 8.8*  HCT 32.9* 28.3* 28.1*  MCV 87.3 87.6 85.7  PLT 244 228 921   Basic Metabolic Panel: Recent Labs  Lab 09/09/22 2039 09/11/22 0420 09/13/22 0511  NA 137 142 137  K 3.8 4.0 3.7  CL 106 112* 108  CO2 '22 22 23  '$ GLUCOSE 119* 78 84  BUN 30* 23 13  CREATININE 1.67* 1.20* 0.91  CALCIUM 8.8* 8.2* 8.4*  MG  --   --  1.9  PHOS  --   --  2.6    Studies: No results found.  Scheduled Meds:  anastrozole  1 mg Oral Daily   aspirin EC  81 mg Oral Daily   bisacodyl  10 mg Oral QHS   bisacodyl  10 mg Rectal BID   ciprofloxacin  500 mg Oral BID   doxycycline  100 mg Oral Q12H   empagliflozin  10 mg Oral Daily   enoxaparin (LOVENOX) injection  0.5 mg/kg Subcutaneous Q24H   isosorbide mononitrate  60 mg Oral Daily   loratadine  10 mg Oral Daily   losartan  25 mg Oral Daily   oxybutynin  5 mg Oral BID   pantoprazole  40 mg Oral Daily  polyethylene glycol  17 g Oral BID   sodium phosphate  1 enema Rectal Once   Continuous Infusions:  sodium chloride 10 mL/hr at 09/13/22 1154   PRN Meds: sodium chloride, acetaminophen **OR** acetaminophen, bisacodyl, cloNIDine, docusate sodium, hydrALAZINE, metoprolol tartrate, ondansetron **OR** ondansetron (ZOFRAN) IV  Time spent: 50 minutes  Author: Val Riles. MD [facility] 09/13/2022 3:08 PM  To reach On-call, see care teams to locate the attending and reach out to them via [email or web address]. If 7PM-7AM, please contact night-coverage If you still have difficulty reaching the attending provider, please page the [facility] (Director on Call) for [facility] on amion for assistance.

## 2022-09-13 NOTE — Plan of Care (Signed)
  Problem: Metabolic: Goal: Ability to maintain appropriate glucose levels will improve Outcome: Progressing   Problem: Nutritional: Goal: Maintenance of adequate nutrition will improve Outcome: Progressing   Problem: Skin Integrity: Goal: Risk for impaired skin integrity will decrease Outcome: Progressing   Problem: Elimination: Goal: Will not experience complications related to urinary retention Outcome: Progressing   Problem: Safety: Goal: Ability to remain free from injury will improve Outcome: Progressing

## 2022-09-13 NOTE — TOC Progression Note (Signed)
Transition of Care [facility]) - Progression Note    Patient Details  Name: Kaileena Obi MRN: [id number] Date of Birth: 1946-01-07  Transition of Care [facility]) CM/SW Contact  Kyrie Fludd, [place], [place] Phone Number: 09/13/2022, 3:33 PM  Clinical Narrative:     Met with patient at bedside to review bed offers. Patient has selected [facility]. This Education officer, museum to start authorization for SNF stay.  Tyberius Ryner, LCSW Transition of Care   Expected Discharge Plan: Skilled Nursing Facility Barriers to Discharge: Continued Medical Work up  Expected Discharge Plan and [place] Choice: [place] Living arrangements for the past 2 months: Apartment                                       Social Determinants of Health (SDOH) Interventions [place]: No Food Insecurity (09/11/2022)  Housing: Low Risk  (09/11/2022)  Transportation Needs: No Transportation Needs (09/11/2022)  Utilities: Not At Risk (09/11/2022)  Tobacco Use: Medium Risk (09/09/2022)    Readmission Risk Interventions    09/12/2022   11:09 AM  Readmission Risk Prevention Plan  PCP or Specialist Appt within 3-5 Days Complete  Social Work Consult for [place] Planning/Counseling [place] Not Applicable

## 2022-09-13 NOTE — Plan of Care (Signed)

## 2022-09-13 NOTE — Progress Notes (Signed)
Soap sud enema given. Pt tolerated well. Incoming shift made aware. Will continue to monitor.

## 2022-09-13 NOTE — TOC Progression Note (Signed)
Transition of Care [facility]) - Progression Note    Patient Details  Name: [name] MRN: [id number] Date of Birth: April 06, 1946  Transition of Care [facility]) CM/SW Contact  [address], [place], [place] Phone Number: 09/13/2022, 4:33 PM  Clinical Narrative:    Patient accepted bed offer at [facility]. [place] started reference #[id number] clinicals faxed to [phone]  [facility], LCSW Transition of Care     Expected Discharge Plan: Skilled Nursing Facility Barriers to Discharge: Continued Medical Work up  Expected Discharge Plan and [place] Choice: [place] arrangements for the past 2 months: Apartment                                       Social Determinants of Health (SDOH) Interventions [place]: No Food Insecurity (09/11/2022)  Housing: Low Risk  (09/11/2022)  Transportation Needs: No Transportation Needs (09/11/2022)  Utilities: Not At Risk (09/11/2022)  Tobacco Use: Medium Risk (09/09/2022)    Readmission Risk Interventions    09/12/2022   11:09 AM  Readmission Risk Prevention Plan  PCP or Specialist Appt within 3-5 Days Complete  Social Work Consult for [place] Planning/Counseling [place] Not Applicable

## 2022-09-14 DIAGNOSIS — L03119 Cellulitis of unspecified part of limb: Secondary | ICD-10-CM | POA: Diagnosis not present

## 2022-09-14 LAB — BASIC METABOLIC PANEL
Anion gap: 8 (ref 5–15)
BUN: 12 mg/dL (ref 8–23)
CO2: 23 mmol/L (ref 22–32)
Calcium: 8.2 mg/dL — ABNORMAL LOW (ref 8.9–10.3)
Chloride: 105 mmol/L (ref 98–111)
Creatinine, Ser: 0.92 mg/dL (ref 0.44–1.00)
GFR, Estimated: >60 mL/min (ref >60)
Glucose, Bld: 78 mg/dL (ref 70–99)
Potassium: 3.7 mmol/L (ref 3.5–5.1)
Sodium: 136 mmol/L (ref 135–145)

## 2022-09-14 LAB — CBC
HCT: 26.5 % — ABNORMAL LOW (ref 36.0–46.0)
Hemoglobin: 8.6 g/dL — ABNORMAL LOW (ref 12.0–15.0)
MCH: 27.4 pg (ref 26.0–34.0)
MCHC: 32.5 g/dL (ref 30.0–36.0)
MCV: 84.4 fL (ref 80.0–100.0)
Platelets: 221 10*3/uL (ref 150–400)
RBC: 3.14 MIL/uL — ABNORMAL LOW (ref 3.87–5.11)
RDW: 14.4 % (ref 11.5–15.5)
WBC: 8 10*3/uL (ref 4.0–10.5)
nRBC: 0 % (ref 0.0–0.2)

## 2022-09-14 LAB — GLUCOSE, CAPILLARY
Glucose-Capillary: 78 mg/dL (ref 70–99)
Glucose-Capillary: 136 mg/dL — ABNORMAL HIGH (ref 70–99)
Glucose-Capillary: 95 mg/dL (ref 70–99)
Glucose-Capillary: 97 mg/dL (ref 70–99)

## 2022-09-14 LAB — PHOSPHORUS: Phosphorus: 2.7 mg/dL (ref 2.5–4.6)

## 2022-09-14 LAB — MAGNESIUM: Magnesium: 1.9 mg/dL (ref 1.7–2.4)

## 2022-09-14 NOTE — TOC Progression Note (Signed)
Transition of Care [facility]) - Progression Note    Patient Details  Name: Chantee Cerino MRN: [id number] Date of Birth: [birth year]  Transition of Care [facility]) CM/SW Contact  [address] [place], [place] Phone Number: 09/14/2022, 3:18 PM  Clinical Narrative:    SNF authorization received from [facility]. Patient authorized from 09/14/22-09/16/22 Authorization #[id number]. Tammie from [facility] contacted to coordinate admission. VM left requesting a return call.   Honey Zakarian, LCSW Transition of Care    Expected Discharge Plan: Skilled Nursing Facility Barriers to Discharge: Continued Medical Work up  Expected Discharge Plan and [place] Choice: [place] Living arrangements for the past 2 months: Apartment                                       Social Determinants of Health (SDOH) Interventions [place]: No Food Insecurity (09/11/2022)  Housing: Low Risk  (09/11/2022)  Transportation Needs: No Transportation Needs (09/11/2022)  Utilities: Not At Risk (09/11/2022)  Tobacco Use: Medium Risk (09/09/2022)    Readmission Risk Interventions    09/12/2022   11:09 AM  Readmission Risk Prevention Plan  PCP or Specialist Appt within 3-5 Days Complete  Social Work Consult for [place] Planning/Counseling [place] Not Applicable

## 2022-09-14 NOTE — Consult Note (Signed)
[place] Nurse Consult Note: Reason for Consult:Patient consult performed on 09/12/22 by my associate, D. Barbie Haggis. While in house, Nursing will perform daily cleansing of the LEs, dressing open areas with xeroform gauze and wrapping of bilateral LEs with a dry boot (i.e., Kerlix topped with ACE bandages). Heels are to be floated. Wound care orders are current/active and appropriate.   Patient to return to the care and over sight of the outpatient wound care center post discharge. It is noted that she has chronic lymphedema and that she has been treated by the outpatient wound care center for approximately 3 years.  Wound type:lymphedema  [place] nursing team will not follow, but will remain available to this patient, the nursing and medical teams.  Please re-consult if needed.  Thank you for inviting [place] to participate in this patient's Plan of Care.  Maudie Flakes, MSN, RN, CNS, [place], Serita Grammes, [employer], [employer] phone:  [phone]

## 2022-09-14 NOTE — Progress Notes (Signed)
[facility] Progress Note  Patient: [name]    HWE:[id number]  DOA: 09/10/2022     Date of Service: the patient was seen and examined on 09/14/2022  Chief Complaint  Patient presents with   Leg Pain   Fall   Brief hospital course:  [name] is a 77 y.o. female with medical history significant of gait abnormality, history of acute respiratory failure with hypoxia, history of pneumonia, sleep apnea, right breast cancer treated with chemo and radiotherapy, chronic systolic heart failure, GERD, headache, hypertension, peripheral neuropathy who presented to the emergency department with acute on chronic bilateral lower extremity pain following a fall at home on 09/10/22.  ED course: Initial vital signs were temperature 97.8 F, pulse 94, respirations 16, BP 157/78 mmHg O2 sat 93% on room air.  She received vancomycin in the emergency department.   Lab work: CBC showed white count 12.2, hemoglobin 10.0 g/dL platelets 244.  Troponin was 33 then 43 ng/L.  BNP 113.1 pg/mL.  BMP showed a glucose of 119, BUN 30, creatinine 1.67 and calcium 8.8 mg/deciliter.  The rest of the electrolytes were normal.  Coronavirus, influenza and RSV PCR was negative.   Imaging: CT head without contrast with no evidence of intracranial injury. Bilateral tibia/fibula with chronic osteoarthritis of the knees and ankle.  No acute or focal findings of the remainder of the bilateral tibia and fibula.   Assessment and Plan:  Cellulitis of lower extremity S/p IV fluids, no more need.  Continue oral hydration S/p cefepime 2 g every 8 hour and vancomycin as per pharmacy 1/6 changed to p.o. antibiotics Cipro and doxycycline to cover Pseudomonas and MRSA as well.  Total antibiotics for 10 days duration. Continue local care. Blood glucose control.    AKI on CKD stage IIIb, 1.67--1.2 --0.91 AKI resolved Continue oral hydration and monitor renal function daily   Essential hypertension Continue losartan 25 mg  p.o. daily. Continue clonidine 0.1 mg p.o. twice daily as needed. She is also on Imdur 60 mg p.o. twice daily. Use as needed hydralazine and Lopressor  Chronic systolic CHF (congestive heart failure) (HCC) Continue losartan and isosorbide as above.   OSA (obstructive sleep apnea) CPAP at bedtime.   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 daily.   PAD (peripheral artery disease) (HCC) Continue aspirin 81 mg p.o. daily. Might benefit from statin. lipid panel LDL 84 well-controlled   Class II obesity Lifestyle modifications.   Chronic kidney disease, stage 3b ([place]) Monitor renal function electrolytes.   Prediabetic, hemoglobin A1c 6.3 Continue diabetic diet Monitor FSBG Continue Jardiance 10 mg p.o. daily. Continue NovoLog sliding scale   Right renal cyst, incidental finding CT A/P: 2 cm exophytic lesion in the midportion of right kidney with density measurements higher than usual for simple cysts. This may suggest hemorrhagic or proteinaceous cyst or solid lesion. Follow-up multiphasic CT or MRI may be considered. Recommended to follow with PCP as an outpatient.  Severe osteoarthritis of bilateral knee joints X-ray reviewed, no significant joint effusion Follow-up with orthopedic surgery as an outpatient PT and OT eval done, recommend SNF placement   Constipation, started MiraLAX and Dulcolax. CT a/p rule out any obstruction. 1/5 mineral oil enema given 1/6 soapsuds enema was given, Dulcolax suppository was also given and then patient moved bowels.   Body mass index is 35.88 kg/m.  Interventions:       Diet: Heart healthy/carb modified DVT Prophylaxis: Subcutaneous Lovenox   Advance goals of care  discussion: Full code  Family Communication: family was NOT present at bedside, at the time of interview.  The pt provided permission to discuss medical plan with the family. Opportunity was given to ask question and all questions  were answered satisfactorily.   Disposition:  Pt is from Home, admitted with Fall and LE cellulitis, s/p IV antibiotics, PT/OT eval recommended SNF placement.  Clinically stable Discharge to SNF when bed available.  TOC following for placement  Subjective: No significant events overnight, patient did not move bowels 1 time last night and she feels that she is going to move bowels today as well.  Denies any abdominal pain, no nausea vomiting, no chest pain or palpitation, no shortness of breath.   Physical Exam: General:  alert oriented to time, place, and person.  Appear in mild distress, affect appropriate Eyes: PERRLA ENT: Oral Mucosa Clear, moist  Neck: no JVD,  Cardiovascular: S1 and S2 Present, no Murmur,  Respiratory: good respiratory effort, Bilateral Air entry equal and Decreased, no Crackles, no wheezes Abdomen: Bowel Sound present, Soft and no tenderness,  Skin: no rashes Extremities: no significant pedal edema, no calf tenderness, chronic pigmentation, possible cellulitis left lower extremity, dressing CDI Neurologic: without any new focal findings Gait not checked due to patient safety concerns  Vitals:   09/13/22 1604 09/13/22 1849 09/14/22 0436 09/14/22 0802  BP: (!) 156/80 (!) 148/79 (!) 140/71 (!) 140/64  Pulse: 72 79 71 60  Resp: '16 16 18 16  '$ Temp: 98.9 F (37.2 C) 99.6 F (37.6 C) 99.6 F (37.6 C) 98.5 F (36.9 C)  TempSrc: Oral Oral Axillary Oral  SpO2: 96% 97% 98% 98%  Weight:      Height:        Intake/Output Summary (Last 24 hours) at 09/14/2022 1533 Last data filed at 09/14/2022 8250 Gross per 24 hour  Intake 320 ml  Output 1200 ml  Net -880 ml   Filed Weights   09/09/22 2031  Weight: 110.2 kg    Data Reviewed: I have personally reviewed and interpreted daily labs, tele strips, imagings as discussed above. I reviewed all nursing notes, pharmacy notes, vitals, pertinent old records I have discussed plan of care as described above with RN and  patient/family.  CBC: Recent Labs  Lab 09/09/22 2039 09/11/22 0420 09/13/22 0511 09/14/22 0504  WBC 12.2* 10.5 8.1 8.0  HGB 10.0* 8.7* 8.8* 8.6*  HCT 32.9* 28.3* 28.1* 26.5*  MCV 87.3 87.6 85.7 84.4  PLT 244 228 214 539   Basic Metabolic Panel: Recent Labs  Lab 09/09/22 2039 09/11/22 0420 09/13/22 0511 09/14/22 0504  NA 137 142 137 136  K 3.8 4.0 3.7 3.7  CL 106 112* 108 105  CO2 '22 22 23 23  '$ GLUCOSE 119* 78 84 78  BUN 30* '23 13 12  '$ CREATININE 1.67* 1.20* 0.91 0.92  CALCIUM 8.8* 8.2* 8.4* 8.2*  MG  --   --  1.9 1.9  PHOS  --   --  2.6 2.7    Studies: No results found.  Scheduled Meds:  anastrozole  1 mg Oral Daily   aspirin EC  81 mg Oral Daily   bisacodyl  10 mg Oral QHS   bisacodyl  10 mg Rectal BID   ciprofloxacin  500 mg Oral BID   doxycycline  100 mg Oral Q12H   empagliflozin  10 mg Oral Daily   enoxaparin (LOVENOX) injection  0.5 mg/kg Subcutaneous Q24H   isosorbide mononitrate  60 mg Oral Daily  loratadine  10 mg Oral Daily   losartan  25 mg Oral Daily   oxybutynin  5 mg Oral BID   pantoprazole  40 mg Oral Daily   polyethylene glycol  17 g Oral BID   Continuous Infusions:  sodium chloride 10 mL/hr at 09/13/22 1154   PRN Meds: sodium chloride, acetaminophen **OR** acetaminophen, bisacodyl, cloNIDine, docusate sodium, hydrALAZINE, metoprolol tartrate, ondansetron **OR** ondansetron (ZOFRAN) IV  Time spent: 40 minutes  Author: Val Riles. MD [facility] 09/14/2022 3:33 PM  To reach On-call, see care teams to locate the attending and reach out to them via [email or web address]. If 7PM-7AM, please contact night-coverage If you still have difficulty reaching the attending provider, please page the [facility] (Director on Call) for [facility] on amion for assistance.

## 2022-09-15 DIAGNOSIS — L03119 Cellulitis of unspecified part of limb: Secondary | ICD-10-CM | POA: Diagnosis not present

## 2022-09-15 LAB — BASIC METABOLIC PANEL
Anion gap: 6 (ref 5–15)
BUN: 14 mg/dL (ref 8–23)
CO2: 26 mmol/L (ref 22–32)
Calcium: 8.2 mg/dL — ABNORMAL LOW (ref 8.9–10.3)
Chloride: 105 mmol/L (ref 98–111)
Creatinine, Ser: 0.96 mg/dL (ref 0.44–1.00)
GFR, Estimated: >60 mL/min (ref >60)
Glucose, Bld: 78 mg/dL (ref 70–99)
Potassium: 3.8 mmol/L (ref 3.5–5.1)
Sodium: 137 mmol/L (ref 135–145)

## 2022-09-15 LAB — GLUCOSE, CAPILLARY
Glucose-Capillary: 78 mg/dL (ref 70–99)
Glucose-Capillary: 96 mg/dL (ref 70–99)
Glucose-Capillary: 113 mg/dL — ABNORMAL HIGH (ref 70–99)

## 2022-09-15 LAB — CBC
HCT: 27.7 % — ABNORMAL LOW (ref 36.0–46.0)
Hemoglobin: 8.7 g/dL — ABNORMAL LOW (ref 12.0–15.0)
MCH: 26.7 pg (ref 26.0–34.0)
MCHC: 31.4 g/dL (ref 30.0–36.0)
MCV: 85 fL (ref 80.0–100.0)
Platelets: 245 10*3/uL (ref 150–400)
RBC: 3.26 MIL/uL — ABNORMAL LOW (ref 3.87–5.11)
RDW: 14.4 % (ref 11.5–15.5)
WBC: 7.8 10*3/uL (ref 4.0–10.5)
nRBC: 0 % (ref 0.0–0.2)

## 2022-09-15 LAB — PHOSPHORUS: Phosphorus: 3.5 mg/dL (ref 2.5–4.6)

## 2022-09-15 LAB — MAGNESIUM: Magnesium: 2 mg/dL (ref 1.7–2.4)

## 2022-09-15 MED ORDER — ACETAMINOPHEN 325 MG PO TABS: 650.0000 mg | ORAL_TABLET | Freq: Four times a day (QID) | ORAL | Status: DC | PRN

## 2022-09-15 MED ORDER — DOXYCYCLINE HYCLATE 100 MG PO TABS: 100.0000 mg | ORAL_TABLET | Freq: Two times a day (BID) | ORAL | 0 refills | Status: AC

## 2022-09-15 MED ORDER — BISACODYL 5 MG PO TBEC: 10.0000 mg | DELAYED_RELEASE_TABLET | Freq: Every day | ORAL | 0 refills | Status: DC

## 2022-09-15 MED ORDER — EUCERIN EX LOTN: TOPICAL_LOTION | Freq: Two times a day (BID) | CUTANEOUS | 0 refills | Status: DC

## 2022-09-15 MED ORDER — CIPROFLOXACIN HCL 500 MG PO TABS: 500.0000 mg | ORAL_TABLET | Freq: Two times a day (BID) | ORAL | 0 refills | Status: AC

## 2022-09-15 MED ORDER — POLYETHYLENE GLYCOL 3350 17 G PO PACK: 17.0000 g | PACK | Freq: Every day | ORAL | 0 refills | Status: DC

## 2022-09-15 NOTE — TOC Transition Note (Addendum)
Transition of Care [facility]) - CM/SW Discharge Note   Patient Details  Name: [name] MRN: [id number] Date of Birth: 07-Oct-1945  [facility] CM/SW Contact:  Candie Chroman, LCSW Phone Number: 09/15/2022, 2:36 PM   Clinical Narrative:   Patient has orders to discharge to [facility] today. RN will call report to [phone]. EMS transport has been arranged. No further concerns. CSW signing off.  3:04 pm: Discharge will be postponed. Patient does not have home CPAP so facility has to order one. MD, patient, and daughter are aware.  Final next level of care: [place] Barriers to Discharge: Barriers Resolved   Patient Goals and CMS Choice   Choice offered to / list presented to : Patient, Adult Children  Discharge Placement     Existing PASRR number confirmed : 09/12/22          Patient chooses bed at: [facility] [place] Patient to be transferred to facility by: EMS Name of family member notified: Theodis Blaze Patient and family notified of of transfer: 09/15/22  Discharge Plan and Services Additional resources added to the After Visit Summary for       Post Acute Care Choice: [place]                               Social Determinants of Health (SDOH) Interventions SDOH Screenings   Food Insecurity: No Food Insecurity (09/11/2022)  Housing: Low Risk  (09/11/2022)  Transportation Needs: No Transportation Needs (09/11/2022)  Utilities: Not At Risk (09/11/2022)  Tobacco Use: Medium Risk (09/09/2022)     Readmission Risk Interventions    09/12/2022   11:09 AM  Readmission Risk Prevention Plan  PCP or Specialist Appt within 3-5 Days Complete  Social Work Consult for [place] Planning/Counseling Complete  Palliative Care Screening Not Applicable

## 2022-09-15 NOTE — TOC Progression Note (Signed)
Transition of Care [facility]) - Progression Note    Patient Details  Name: [name] MRN: [id number] Date of Birth: 1946/09/07  Transition of Care [facility]) CM/SW [place], LCSW Phone Number: 09/15/2022, 9:20 AM  Clinical Narrative:  [facility] can accept patient today. Sent secure chat to MD to notify.   Expected Discharge Plan: [place] Barriers to Discharge: Continued Medical Work up  Expected Discharge Plan and [place] Choice: [place] arrangements for the past 2 months: Apartment                                       Social Determinants of Health (SDOH) Interventions [place]: No Food Insecurity (09/11/2022)  Housing: Low Risk  (09/11/2022)  Transportation Needs: No Transportation Needs (09/11/2022)  Utilities: Not At Risk (09/11/2022)  Tobacco Use: Medium Risk (09/09/2022)    Readmission Risk Interventions    09/12/2022   11:09 AM  Readmission Risk Prevention Plan  PCP or Specialist Appt within 3-5 Days Complete  Social Work Consult for [place] Planning/Counseling Complete  Palliative Care Screening Not Applicable

## 2022-09-15 NOTE — Plan of Care (Signed)

## 2022-09-15 NOTE — Progress Notes (Signed)
[facility] Progress Note  Patient: [name]    NLG:[id number]  DOA: 09/10/2022     Date of Service: the patient was seen and examined on 09/15/2022  Chief Complaint  Patient presents with   Leg Pain   Fall   Brief hospital course:  [name] is a 77 y.o. female with medical history significant of gait abnormality, history of acute respiratory failure with hypoxia, history of pneumonia, sleep apnea, right breast cancer treated with chemo and radiotherapy, chronic systolic heart failure, GERD, headache, hypertension, peripheral neuropathy who presented to the emergency department with acute on chronic bilateral lower extremity pain following a fall at home on 09/10/22.  ED course: Initial vital signs were temperature 97.8 F, pulse 94, respirations 16, BP 157/78 mmHg O2 sat 93% on room air.  She received vancomycin in the emergency department.   Lab work: CBC showed white count 12.2, hemoglobin 10.0 g/dL platelets 244.  Troponin was 33 then 43 ng/L.  BNP 113.1 pg/mL.  BMP showed a glucose of 119, BUN 30, creatinine 1.67 and calcium 8.8 mg/deciliter.  The rest of the electrolytes were normal.  Coronavirus, influenza and RSV PCR was negative.   Imaging: CT head without contrast with no evidence of intracranial injury. Bilateral tibia/fibula with chronic osteoarthritis of the knees and ankle.  No acute or focal findings of the remainder of the bilateral tibia and fibula.   Assessment and Plan:  Cellulitis of lower extremity S/p IV fluids, no more need.  Continue oral hydration S/p cefepime 2 g every 8 hour and vancomycin as per pharmacy 1/6 changed to p.o. antibiotics Cipro and doxycycline to cover Pseudomonas and MRSA as well.  Total antibiotics for 10 days duration. Continue local care. Blood glucose control.    AKI on CKD stage IIIb, 1.67--1.2 --0.91 AKI resolved Continue oral hydration and monitor renal function daily   Essential hypertension Continue losartan 25 mg  p.o. daily. Continue clonidine 0.1 mg p.o. twice daily as needed. She is also on Imdur 60 mg p.o. twice daily. Use as needed hydralazine and Lopressor  Chronic systolic CHF (congestive heart failure) (HCC) Continue losartan and isosorbide as above.   OSA (obstructive sleep apnea) CPAP at bedtime.   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 daily.   PAD (peripheral artery disease) (HCC) Continue aspirin 81 mg p.o. daily. Might benefit from statin. lipid panel LDL 84 well-controlled   Class II obesity Lifestyle modifications.   Chronic kidney disease, stage 3b ([place]) Monitor renal function electrolytes.   Prediabetic, hemoglobin A1c 6.3 Continue diabetic diet Monitor FSBG Continue Jardiance 10 mg p.o. daily. Continue NovoLog sliding scale   Right renal cyst, incidental finding CT A/P: 2 cm exophytic lesion in the midportion of right kidney with density measurements higher than usual for simple cysts. This may suggest hemorrhagic or proteinaceous cyst or solid lesion. Follow-up multiphasic CT or MRI may be considered. Recommended to follow with PCP as an outpatient.  Severe osteoarthritis of bilateral knee joints X-ray reviewed, no significant joint effusion Follow-up with orthopedic surgery as an outpatient PT and OT eval done, recommend SNF placement   Constipation, started MiraLAX and Dulcolax. CT a/p rule out any obstruction. 1/5 mineral oil enema given 1/6 soapsuds enema was given, Dulcolax suppository was also given and then patient moved bowels.   Body mass index is 35.88 kg/m.  Interventions:       Diet: Heart healthy/carb modified DVT Prophylaxis: Subcutaneous Lovenox   Advance goals of care  discussion: Full code  Family Communication: family was NOT present at bedside, at the time of interview.  The pt provided permission to discuss medical plan with the family. Opportunity was given to ask question and all questions  were answered satisfactorily.   Disposition:  Pt is from Home, admitted with Fall and LE cellulitis, s/p IV antibiotics, PT/OT eval recommended SNF placement.  Clinically stable Discharge to SNF when bed available.  TOC following for placement 1/8 discharge was canceled today as patient does not have home CPAP and SNF has to order one today for her, so plan is to discharge her tomorrow a.m.  Subjective: No significant events overnight, patient did not move bowels 1 time last night and she feels that she is going to move bowels today as well.  Denies any abdominal pain, no nausea vomiting, no chest pain or palpitation, no shortness of breath.   Physical Exam: General:  alert oriented to time, place, and person.  Appear in mild distress, affect appropriate Eyes: PERRLA ENT: Oral Mucosa Clear, moist  Neck: no JVD,  Cardiovascular: S1 and S2 Present, no Murmur,  Respiratory: good respiratory effort, Bilateral Air entry equal and Decreased, no Crackles, no wheezes Abdomen: Bowel Sound present, Soft and no tenderness,  Skin: no rashes Extremities: no significant pedal edema, no calf tenderness, chronic pigmentation, possible cellulitis left lower extremity, dressing CDI Neurologic: without any new focal findings Gait not checked due to patient safety concerns  Vitals:   09/14/22 1615 09/14/22 1956 09/15/22 0407 09/15/22 0800  BP: 137/62 (!) 116/53 (!) 121/55 (!) 132/53  Pulse: 68 76 70 64  Resp: '18 18 18 18  '$ Temp: 99.2 F (37.3 C) 99.2 F (37.3 C) 99.3 F (37.4 C) 99.3 F (37.4 C)  TempSrc: Oral Oral Oral   SpO2: 97% 97% 96% 95%  Weight:      Height:        Intake/Output Summary (Last 24 hours) at 09/15/2022 1504 Last data filed at 09/15/2022 1000 Gross per 24 hour  Intake 120 ml  Output 750 ml  Net -630 ml   Filed Weights   09/09/22 2031  Weight: 110.2 kg    Data Reviewed: I have personally reviewed and interpreted daily labs, tele strips, imagings as discussed above. I  reviewed all nursing notes, pharmacy notes, vitals, pertinent old records I have discussed plan of care as described above with RN and patient/family.  CBC: Recent Labs  Lab 09/09/22 2039 09/11/22 0420 09/13/22 0511 09/14/22 0504 09/15/22 0422  WBC 12.2* 10.5 8.1 8.0 7.8  HGB 10.0* 8.7* 8.8* 8.6* 8.7*  HCT 32.9* 28.3* 28.1* 26.5* 27.7*  MCV 87.3 87.6 85.7 84.4 85.0  PLT 244 228 214 221 242   Basic Metabolic Panel: Recent Labs  Lab 09/09/22 2039 09/11/22 0420 09/13/22 0511 09/14/22 0504 09/15/22 0422  NA 137 142 137 136 137  K 3.8 4.0 3.7 3.7 3.8  CL 106 112* 108 105 105  CO2 '22 22 23 23 26  '$ GLUCOSE 119* 78 84 78 78  BUN 30* '23 13 12 14  '$ CREATININE 1.67* 1.20* 0.91 0.92 0.96  CALCIUM 8.8* 8.2* 8.4* 8.2* 8.2*  MG  --   --  1.9 1.9 2.0  PHOS  --   --  2.6 2.7 3.5    Studies: No results found.  Scheduled Meds:  anastrozole  1 mg Oral Daily   aspirin EC  81 mg Oral Daily   bisacodyl  10 mg Oral QHS   ciprofloxacin  500  mg Oral BID   doxycycline  100 mg Oral Q12H   empagliflozin  10 mg Oral Daily   enoxaparin (LOVENOX) injection  0.5 mg/kg Subcutaneous Q24H   isosorbide mononitrate  60 mg Oral Daily   loratadine  10 mg Oral Daily   losartan  25 mg Oral Daily   oxybutynin  5 mg Oral BID   pantoprazole  40 mg Oral Daily   polyethylene glycol  17 g Oral BID   Continuous Infusions:  sodium chloride 10 mL/hr at 09/13/22 1154   PRN Meds: sodium chloride, acetaminophen **OR** acetaminophen, bisacodyl, cloNIDine, docusate sodium, hydrALAZINE, metoprolol tartrate, ondansetron **OR** ondansetron (ZOFRAN) IV  Time spent: 40 minutes  Author: Val Riles. MD [facility] 09/15/2022 3:04 PM  To reach On-call, see care teams to locate the attending and reach out to them via [email or web address]. If 7PM-7AM, please contact night-coverage If you still have difficulty reaching the attending provider, please page the [facility] (Director on Call) for [facility] on amion for  assistance.

## 2022-09-15 NOTE — Progress Notes (Signed)
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  MD assessment includes: Cellulitis of lower extremity, AKI on CKD, HTN, and severe OA of bilateral knee joints.    PT Comments    Pt was pleasant and motivated to participate during the session and put forth good effort throughout. Pt put forth good effort with below therex and tolerated gentle knee flex PROM.  Multiple attempts at sit to stand made from recliner with max multi-modal cueing for sequencing but pt unable to clear the surface of the chair.  Pt limited during transfer attempts by weakness and poor bilateral knee flex ROM making pt unable to get her COM over her feet during transfer attempts.  Nursing notified of recommendation of hoyer use for back to bed.  Pt will benefit from PT services in a SNF setting upon discharge to safely address deficits listed in patient problem list for decreased caregiver assistance and eventual return to PLOF.     Recommendations for follow up therapy are one component of a multi-disciplinary discharge planning process, led by the attending physician.  Recommendations may be updated based on patient status, additional functional criteria and insurance authorization.  Follow Up Recommendations  Skilled nursing-short term rehab (<3 hours/day) Can patient physically be transported by private vehicle: No   Assistance Recommended at Discharge Frequent or constant Supervision/Assistance  Patient can return home with the following Two people to help with walking and/or transfers;A lot of help with bathing/dressing/bathroom;Assistance with cooking/housework;Direct  supervision/assist for medications management;Assist for transportation   Equipment Recommendations  Rolling walker (2 wheels)    Recommendations for Other Services       Precautions / Restrictions Precautions Precautions: Fall Restrictions Weight Bearing Restrictions: No     Mobility  Bed Mobility               General bed mobility comments: NT, pt in recliner    Transfers Overall transfer level: Needs assistance   Transfers: Sit to/from Stand Sit to Stand: Total assist           General transfer comment: Pt unable to come to standing from recliner with effort limited by poor knee flex ROM due to pain; multiple attempts made with max A but pt unable to even clear the surface of the chair    Ambulation/Gait                   Stairs             Wheelchair Mobility    Modified Rankin (Stroke Patients Only)       Balance Overall balance assessment: Needs assistance, History of Falls   Sitting balance-Leahy Scale: Good                                      Cognition Arousal/Alertness: Awake/alert Behavior During Therapy: WFL for tasks assessed/performed Overall Cognitive Status: Within Functional Limits for tasks assessed  Exercises Total Joint Exercises Ankle Circles/Pumps: AROM, Strengthening, Both, 10 reps, 15 reps Quad Sets: Strengthening, Both, 10 reps, 15 reps Hip ABduction/ADduction: AAROM, Strengthening, Both, 5 reps Straight Leg Raises: AAROM, Strengthening, Both, 5 reps Long Arc Quad: AROM, Strengthening, Both, 10 reps, 15 reps Knee Flexion: AROM, Strengthening, Both, 10 reps, 15 reps Other Exercises Other Exercises: HEP education review for BLE APs, QS, GS, and LAQs x 10 each 5-6x/day Other Exercises: Gentle bilateral knee flex PROM with holds at end range to patient's tolerance    General Comments        Pertinent Vitals/Pain Pain  Assessment Pain Assessment: 0-10 Pain Score: 9  Pain Location: L knee Pain Descriptors / Indicators: Aching, Sore, Grimacing Pain Intervention(s): Repositioned, Monitored during session, Patient requesting pain meds-RN notified    Home Living                          Prior Function            PT Goals (current goals can now be found in the care plan section) Progress towards PT goals: Not progressing toward goals - comment (limited by weakness and knee pain)    Frequency    Min 2X/week      PT Plan Current plan remains appropriate    Co-evaluation              AM-PAC PT "6 Clicks" Mobility   Outcome Measure  Help needed turning from your back to your side while in a flat bed without using bedrails?: A Little Help needed moving from lying on your back to sitting on the side of a flat bed without using bedrails?: A Lot Help needed moving to and from a bed to a chair (including a wheelchair)?: Total Help needed standing up from a chair using your arms (e.g., wheelchair or bedside chair)?: Total Help needed to walk in hospital room?: Total Help needed climbing 3-5 steps with a railing? : Total 6 Click Score: 9    End of Session Equipment Utilized During Treatment: Gait belt Activity Tolerance: Patient limited by pain Patient left: in chair;with call bell/phone within reach;with nursing/sitter in room;Other (comment) Nurse Communication: Mobility status;Other (comment) (recommended hoyer for transfer from chair to bed) PT Visit Diagnosis: Unsteadiness on feet (R26.81);History of falling (Z91.81);Difficulty in walking, not elsewhere classified (R26.2);Muscle weakness (generalized) (M62.81);Pain Pain - Right/Left: Left Pain - part of body: Knee     Time: 6599-3570 PT Time Calculation (min) (ACUTE ONLY): 23 min  Charges:  $Therapeutic Exercise: 8-22 mins $Therapeutic Activity: 8-22 mins                    D. Scott Israa Caban PT, DPT 09/15/22, 5:20  PM

## 2022-09-15 NOTE — Discharge Summary (Signed)
[facility] Discharge Summary   Patient: [name] NUU:[id number]  PCP: Donnie Coffin, MD  Date of admission: 09/10/2022   Date of discharge:  09/16/2022     Discharge Diagnoses:  Principal Problem:   Cellulitis of lower extremity Active Problems:   Chronic systolic CHF (congestive heart failure) (HCC)   Essential hypertension   OSA (obstructive sleep apnea)   Malignant neoplasm of right female breast (HCC)   PAD (peripheral artery disease) (HCC)   Class II obesity   Chronic kidney disease, stage 3b (HCC)   Glucose intolerance   Admitted From: Home Disposition:  SNF  Recommendations for Outpatient Follow-up:  Follow-up with PCP, patient should be seen by an MD in 1 to 2 days, continue dressing for the bilateral lower extremities and wound care. Follow up LABS/TEST:     Contact information for after-discharge care     Destination     [facility] Preferred SNF .   Service: Skilled Nursing Contact information: [place] [phone]                    Diet recommendation: Heart healthy and carb modified diet  Activity: The patient is advised to gradually reintroduce usual activities, as tolerated  Discharge Condition: stable  Code Status: Full code   History of present illness: As per the H and P dictated on admission Hospital Course:   [name] is a 77 y.o. female with medical history significant of gait abnormality, history of acute respiratory failure with hypoxia, history of pneumonia, sleep apnea, right breast cancer treated with chemo and radiotherapy, chronic systolic heart failure, GERD, headache, hypertension, peripheral neuropathy who presented to the emergency department with acute on chronic bilateral lower extremity pain following a fall at home on 09/10/22.  ED course: Initial vital signs were temperature 97.8 F, pulse 94, respirations 16, BP 157/78 mmHg O2 sat 93% on room  air.  She received vancomycin in the emergency department. Lab work: CBC showed white count 12.2, hemoglobin 10.0 g/dL platelets 244.  Troponin was 33 then 43 ng/L.  BNP 113.1 pg/mL.  BMP showed a glucose of 119, BUN 30, creatinine 1.67 and calcium 8.8 mg/deciliter.  The rest of the electrolytes were normal.  Coronavirus, influenza and RSV PCR was negative. Imaging: CT head without contrast with no evidence of intracranial injury. Bilateral tibia/fibula with chronic osteoarthritis of the knees and ankle.  No acute or focal findings of the remainder of the bilateral tibia and fibula.  Assessment and Plan: # Cellulitis of lower extremity S/p IV fluids, no more need.  Continue oral hydration. S/p cefepime 2 g every 8 hour and vancomycin as per pharmacy. On 1/6 changed to p.o. antibiotics Cipro and doxycycline to cover Pseudomonas and MRSA as well.  Continue antibiotics for 6 more days.  Continue wound care and apply Eucerin lotion to keep skin moist and prevent from contacts and infection. # AKI on CKD stage IIIb, 1.67--1.2 --0.96 AKI resolved, Continue oral hydration  # Essential hypertension, Continue losartan 25 mg p.o. daily, clonidine 0.1 mg p.o. twice daily as needed, Imdur 60 mg p.o. twice daily.  Monitor BP and titrate medications accordingly. # Chronic systolic CHF (congestive heart failure) currently euvolemic, Continue losartan and isosorbide as above. # OSA (obstructive sleep apnea), CPAP at bedtime. # Malignant neoplasm of right female breast, Continue Arimidex 1 mg p.o. daily. # PAD (peripheral artery disease) Continue aspirin 81 mg p.o. daily. lipid panel LDL 84  well-controlled # Class II obesity, Lifestyle modifications. # Prediabetic, hemoglobin A1c 6.3, Continue diabetic diet, Continue Jardiance 10 mg p.o. daily. # Right renal cyst, incidental finding. CT A/P: 2 cm exophytic lesion in the midportion of right kidney with density measurements higher than usual for simple cysts. This may  suggest hemorrhagic or proteinaceous cyst or solid lesion. Follow-up multiphasic CT or MRI may be considered. Recommended to follow with PCP as an outpatient. # Severe osteoarthritis of bilateral knee joints, X-ray reviewed, no significant joint effusion Follow-up with orthopedic surgery as an outpatient. PT and OT eval done, recommend SNF placement # Constipation, Resolved after mineral oil enema and soapsuds enema given and suppository was given and also.  Started MiraLAX daily and Dulcolax nightly, skip the dose if no constipation. Body mass index is 35.88 kg/m.  Interventions:   Patient was seen by physical therapy, who recommended  SNF,  which was arranged. On the day of the discharge the patient's vitals were stable, and no other acute medical condition were reported by patient. the patient was felt safe to be discharge at [facility] .  Consultants: None Procedures: None  Discharge Exam: General: Appear in no distress, no Rash; Oral Mucosa Clear, moist. Cardiovascular: S1 and S2 Present, no Murmur, Respiratory: normal respiratory effort, Bilateral Air entry present and no Crackles, no wheezes Abdomen: Bowel Sound present, Soft and no tenderness, no hernia Extremities: mild Pedal edema, no calf tenderness, dry skin, LLE more erythematous and tender than RLE, dressing CDI Neurology: alert and oriented to time, place, and person affect appropriate.  Filed Weights   09/09/22 2031  Weight: 110.2 kg   Vitals:   09/15/22 2001 09/16/22 0553  BP: (!) 104/56 134/65  Pulse: 80 63  Resp: 18 16  Temp: 98.4 F (36.9 C) 100.1 F (37.8 C)  SpO2: 97% 99%    DISCHARGE MEDICATION: Allergies as of 09/16/2022   No Known Allergies      Medication List     STOP taking these medications    acetaminophen-codeine 300-30 MG tablet Commonly known as: TYLENOL #3   Docusate Sodium 100 MG capsule   MiraLax 17 GM/SCOOP powder Generic drug: polyethylene glycol powder Replaced by: polyethylene  glycol 17 g packet       TAKE these medications    acetaminophen 325 MG tablet Commonly known as: TYLENOL Take 2 tablets (650 mg total) by mouth every 6 (six) hours as needed for mild pain (or Fever >/= 101).   anastrozole 1 MG tablet Commonly known as: ARIMIDEX TAKE 1 TABLET BY MOUTH ONCE DAILY. MUST MAKE AN APPOINTMENT TO SEE DR. RAO.   aspirin EC 81 MG tablet Take 81 mg by mouth daily.   bisacodyl 5 MG EC tablet Commonly known as: DULCOLAX Take 2 tablets (10 mg total) by mouth at bedtime. Skip the dose if no constipation   Bladder Control Pads Ex Absorb Misc Apply 1 Dose topically as needed.   ciprofloxacin 500 MG tablet Commonly known as: CIPRO Take 1 tablet (500 mg total) by mouth 2 (two) times daily for 6 days.   cloNIDine 0.1 MG tablet Commonly known as: CATAPRES Take 1 tablet (0.1 mg total) by mouth 2 (two) times daily as needed (systolic BP > 096 or diastolic BP > 95).   [employer] Calcium + D3 600-20 MG-MCG Tabs Generic drug: Calcium Carb-Cholecalciferol TAKE 1 TABLET BY MOUTH TWICE A DAY   diclofenac Sodium 1 % Gel Commonly known as: VOLTAREN Apply 1 application topically as needed.   doxycycline  100 MG tablet Commonly known as: VIBRA-TABS Take 1 tablet (100 mg total) by mouth every 12 (twelve) hours for 6 days.   empagliflozin 10 MG Tabs tablet Commonly known as: Jardiance Take 1 tablet (10 mg total) by mouth daily before breakfast.   eucerin lotion Apply topically 2 (two) times daily. Apply to dry skin especially bilateral lower extremities and arms.   isosorbide mononitrate 60 MG 24 hr tablet Commonly known as: Imdur Take 1 tablet (60 mg total) by mouth 2 (two) times daily.   loratadine 10 MG tablet Commonly known as: CLARITIN Take 10 mg by mouth daily.   losartan 25 MG tablet Commonly known as: COZAAR Take 25 mg by mouth daily.   omeprazole 40 MG capsule Commonly known as: PRILOSEC Take 40 mg by mouth daily.   oxybutynin 5 MG  tablet Commonly known as: DITROPAN Take 5 mg by mouth 2 (two) times daily.   polyethylene glycol 17 g packet Commonly known as: MIRALAX / GLYCOLAX Take 17 g by mouth daily. Skip the dose if no constipation Replaces: MiraLax 17 GM/SCOOP powder               Discharge Care Instructions  (From admission, onward)           Start     Ordered   09/15/22 0000  Discharge wound care:       Comments: Continue wound care as above   09/15/22 1230           No Known Allergies Discharge Instructions     Call MD for:  extreme fatigue   Complete by: As directed    Call MD for:  persistant dizziness or light-headedness   Complete by: As directed    Call MD for:  severe uncontrolled pain   Complete by: As directed    Call MD for:  temperature >100.4   Complete by: As directed    Diet - low sodium heart healthy   Complete by: As directed    Discharge instructions   Complete by: As directed    Follow-up with PCP, patient should be seen by an MD in 1 to 2 days, continue dressing for the bilateral lower extremities and wound care.   Discharge wound care:   Complete by: As directed    Continue wound care as above   Increase activity slowly   Complete by: As directed        The results of significant diagnostics from this hospitalization (including imaging, microbiology, ancillary and laboratory) are listed below for reference.    Significant Diagnostic Studies: CT ABDOMEN PELVIS WO CONTRAST  Result Date: 09/11/2022 CLINICAL DATA:  Abdominal pain EXAM: CT ABDOMEN AND PELVIS WITHOUT CONTRAST TECHNIQUE: Multidetector CT imaging of the abdomen and pelvis was performed following the standard protocol without IV contrast. RADIATION DOSE REDUCTION: This exam was performed according to the departmental dose-optimization program which includes automated exposure control, adjustment of the mA and/or kV according to patient size and/or use of iterative reconstruction technique.  COMPARISON:  05/28/2022 FINDINGS: Lower chest: There is interval appearance of small bilateral pleural effusions, more so on the left side. Motion artifacts limit evaluation of lower lung fields. Possible minimal subsegmental atelectasis is seen in the lower lung fields. Hepatobiliary: There is no dilation of bile ducts. There is 7 mm low-density in the liver, possibly a cyst with no significant change. Gallbladder is slightly distended. There is no wall thickening. There is subtle increased density in the dependent portion of gallbladder  lumen. Pancreas: No focal abnormalities are seen. Spleen: Spleen is not enlarged.  Motion artifacts limit evaluation. Adrenals/Urinary Tract: Adrenals are unremarkable. There is no hydronephrosis. There is 4.1 cm simple appearing cyst in the upper pole of right kidney. There is 2 cm exophytic lesion in the lateral margin of midportion of right kidney with density measurements higher than usual for simple cyst. There are no renal or ureteral stones. Urinary bladder is unremarkable. Stomach/Bowel: Small hiatal hernia is seen. Stomach is not distended. Small bowel loops are not dilated. Appendix is not distinctly seen. There is no pericecal inflammation. There is no significant wall thickening in colon. There is no pericolic stranding. Vascular/Lymphatic: Scattered calcifications are seen in aorta and its major branches. 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 Other: There is no ascites or pneumoperitoneum. Umbilical hernia containing fat is seen. There is previous ventral hernia repair in midline in suprapubic region. Musculoskeletal: There is first-degree anterolisthesis at L4-L5 level. There is disc space narrowing at the L5-S1 level. Degenerative changes are noted in right hip with bony spurs and subcortical cysts. IMPRESSION: There is no evidence of intestinal obstruction or pneumoperitoneum. There is no hydronephrosis. There is interval  appearance of small bilateral pleural effusions and subsegmental atelectasis in the lower lung fields. There is subtle increased density in the lumen of gallbladder which may suggest presence of sludge and possibly tiny stones. There are no imaging signs of acute cholecystitis. There is 2 cm exophytic lesion in the midportion of right kidney with density measurements higher than usual for simple cysts. This may suggest hemorrhagic or proteinaceous cyst or solid lesion. Follow-up multiphasic CT or MRI may be considered. There is simple appearing cyst in the upper pole of right kidney. There is subcentimeter low-density in the liver, possibly cyst or hemangioma. Small hiatal hernia is seen. Uterine fibroids. Degenerative changes are noted in lumbar spine and right hip. Electronically Signed   By: Elmer Picker M.D.   On: 09/11/2022 10:34   DG Knee 1-2 Views Right  Result Date: 09/11/2022 CLINICAL DATA:  Pain and swelling in both knees, LEFT knee pain EXAM: RIGHT KNEE - 1-2 VIEW COMPARISON:  09/09/2022 FINDINGS: Osseous demineralization. Advanced tricompartmental osteoarthritic changes with joint space narrowing and spur formation as well as irregularity of articular surfaces. Bulky spurs at patellofemoral joint and medial compartment posteriorly. No acute fracture, dislocation, or bone destruction. Associated regional soft tissue swelling without definite joint effusion. IMPRESSION: Advanced tricompartmental osteoarthritic changes RIGHT knee. Electronically Signed   By: Lavonia Dana M.D.   On: 09/11/2022 10:06   DG Knee 1-2 Views Left  Result Date: 09/11/2022 CLINICAL DATA:  Pain and swelling of the left knee. EXAM: LEFT KNEE - 1-2 VIEW COMPARISON:  09/09/2022 FINDINGS: There is diffuse soft tissue swelling about the left knee. No joint effusion. Severe tricompartment osteoarthritis is noted with joint space narrowing, subchondral sclerosis and marginal spur formation. No sign of fracture or dislocation.  IMPRESSION: 1. Soft tissue swelling. 2. Severe tricompartment osteoarthritis. Electronically Signed   By: Kerby Moors M.D.   On: 09/11/2022 10:04   DG Tibia/Fibula Left  Result Date: 09/10/2022 CLINICAL DATA:  History of breast cancer.  Lower extremity wounds. EXAM: LEFT TIBIA AND FIBULA - 2 VIEW COMPARISON:  None Available. FINDINGS: Chronic osteoarthritis the knee and ankle joints. No other abnormality seen relating to the remainder the tibia and fibula. IMPRESSION: Chronic osteoarthritis of the knee and ankle joints. No other abnormal finding relating to the remainder of  the tibia and fibula. Electronically Signed   By: Nelson Chimes M.D.   On: 09/10/2022 07:56   DG Tibia/Fibula Right  Result Date: 09/10/2022 CLINICAL DATA:  History breast cancer.  Lower extremity wounds. EXAM: RIGHT TIBIA AND FIBULA - 2 VIEW COMPARISON:  None FINDINGS: Advanced chronic osteoarthritis of the knee the and moderate chronic osteoarthritis of the ankle. No evidence of fracture of the tibia or fibula or any sign of osteomyelitis. IMPRESSION: Chronic osteoarthritis of the knee and ankle. No acute or focal finding of the remainder of the tibia and fibula. Electronically Signed   By: Nelson Chimes M.D.   On: 09/10/2022 07:55   CT Head Wo Contrast  Result Date: 09/10/2022 CLINICAL DATA:  Minor head trauma EXAM: CT HEAD WITHOUT CONTRAST TECHNIQUE: Contiguous axial images were obtained from the base of the skull through the vertex without intravenous contrast. RADIATION DOSE REDUCTION: This exam was performed according to the departmental dose-optimization program which includes automated exposure control, adjustment of the mA and/or kV according to patient size and/or use of iterative reconstruction technique. COMPARISON:  08/22/2022 FINDINGS: Brain: No evidence of acute infarction, hemorrhage, hydrocephalus, extra-axial collection or mass lesion/mass effect. Vascular: No hyperdense vessel or unexpected calcification. Skull:  Normal. Negative for fracture or focal lesion. Sinuses/Orbits: No acute finding. IMPRESSION: No evidence of intracranial injury. Electronically Signed   By: Jorje Guild M.D.   On: 09/10/2022 06:58   DG Knee 1-2 Views Left  Result Date: 09/09/2022 CLINICAL DATA:  Recent fall with left knee pain, initial encounter EXAM: LEFT KNEE - 2 VIEW COMPARISON:  None Available. FINDINGS: Tricompartmental degenerative changes are noted. No joint effusion is seen. No acute fracture or dislocation is noted. IMPRESSION: Degenerative change without acute abnormality. Electronically Signed   By: Inez Catalina M.D.   On: 09/09/2022 21:59   DG Knee 1-2 Views Right  Result Date: 09/09/2022 CLINICAL DATA:  Knee pain following fall, initial encounter EXAM: RIGHT KNEE - 2 VIEW COMPARISON:  02/02/2007 FINDINGS: Progressive degenerative changes of the knee joint are noted involving all 3 joint compartments. No acute fracture or dislocation is noted. No joint effusion is noted. IMPRESSION: Severe progressive degenerative change of the knee joint. Electronically Signed   By: Inez Catalina M.D.   On: 09/09/2022 21:59   DG Chest Port 1 View  Result Date: 09/09/2022 CLINICAL DATA:  Recent fall EXAM: PORTABLE CHEST 1 VIEW COMPARISON:  08/22/2022 FINDINGS: Cardiac shadow is enlarged but stable. Lungs are well aerated bilaterally. No focal infiltrate or effusion is seen. No acute bony abnormality is noted. IMPRESSION: No acute abnormality noted. Electronically Signed   By: Inez Catalina M.D.   On: 09/09/2022 21:55   CT HEAD WO CONTRAST (5MM)  Result Date: 08/22/2022 CLINICAL DATA:  Altered mental status, neurological deficit EXAM: CT HEAD WITHOUT CONTRAST TECHNIQUE: Contiguous axial images were obtained from the base of the skull through the vertex without intravenous contrast. RADIATION DOSE REDUCTION: This exam was performed according to the departmental dose-optimization program which includes automated exposure control, adjustment  of the mA and/or kV according to patient size and/or use of iterative reconstruction technique. COMPARISON:  03/02/2022 FINDINGS: Brain: No acute intracranial findings are seen. There are no signs of bleeding within the cranium. Calcifications are noted in basal ganglia on both sides. Cortical sulci are prominent. Ventricles are not dilated. There is no focal edema or mass effect. Vascular: Unremarkable. Skull: Unremarkable. Sinuses/Orbits: Minimal frothy density is seen in sphenoid sinus. There are no air-fluid levels.  Other: There is increased amount of CSF in the sella suggesting partial empty sella. This finding has not changed. IMPRESSION: No acute intracranial findings are seen in noncontrast CT brain. Atrophy. Electronically Signed   By: Elmer Picker M.D.   On: 08/22/2022 16:14   DG Chest Portable 1 View  Result Date: 08/22/2022 CLINICAL DATA:  Shortness of breath. EXAM: PORTABLE CHEST 1 VIEW COMPARISON:  Chest radiograph dated June 12, 2022; CT examination dated July 16, 2022 FINDINGS: The heart is enlarged. Aorta is ectatic. Low lung volumes with left basilar atelectasis or infiltrate. Thoracic spondylosis and bilateral glenohumeral osteoarthritis. IMPRESSION: 1. Cardiomegaly. 2. Low lung volumes with left basilar atelectasis or infiltrate. Electronically Signed   By: Keane Police D.O.   On: 08/22/2022 14:34    Microbiology: Recent Results (from the past 240 hour(s))  Resp panel by RT-PCR (RSV, Flu A&B, Covid) Anterior Nasal Swab     Status: None   Collection Time: 09/09/22  8:37 PM   Specimen: Anterior Nasal Swab  Result Value Ref Range Status   SARS Coronavirus 2 by RT PCR NEGATIVE NEGATIVE Final    Comment: (NOTE) SARS-CoV-2 target nucleic acids are NOT DETECTED.  The SARS-CoV-2 RNA is generally detectable in upper respiratory specimens during the acute phase of infection. The lowest concentration of SARS-CoV-2 viral copies this assay can detect is 138 copies/mL. A  negative result does not preclude SARS-Cov-2 infection and should not be used as the sole basis for treatment or other patient management decisions. A negative result may occur with  improper specimen collection/handling, submission of specimen other than nasopharyngeal swab, presence of viral mutation(s) within the areas targeted by this assay, and inadequate number of viral copies(<138 copies/mL). A negative result must be combined with clinical observations, patient history, and epidemiological information. The expected result is Negative.  Fact Sheet for Patients:  EntrepreneurPulse.com.au  Fact Sheet for Healthcare Providers:  IncredibleEmployment.be  This test is no t yet approved or cleared by the [place] FDA and  has been authorized for detection and/or diagnosis of SARS-CoV-2 by FDA under an Emergency Use Authorization (EUA). This EUA will remain  in effect (meaning this test can be used) for the duration of the COVID-19 declaration under Section 564(b)(1) of the Act, 21 U.S.C.section 360bbb-3(b)(1), unless the authorization is terminated  or revoked sooner.       Influenza A by PCR NEGATIVE NEGATIVE Final   Influenza B by PCR NEGATIVE NEGATIVE Final    Comment: (NOTE) The Xpert Xpress SARS-CoV-2/FLU/RSV plus assay is intended as an aid in the diagnosis of influenza from Nasopharyngeal swab specimens and should not be used as a sole basis for treatment. Nasal washings and aspirates are unacceptable for Xpert Xpress SARS-CoV-2/FLU/RSV testing.  Fact Sheet for Patients: EntrepreneurPulse.com.au  Fact Sheet for Healthcare Providers: IncredibleEmployment.be  This test is not yet approved or cleared by the [place] FDA and has been authorized for detection and/or diagnosis of SARS-CoV-2 by FDA under an Emergency Use Authorization (EUA). This EUA will remain in effect (meaning this test can  be used) for the duration of the COVID-19 declaration under Section 564(b)(1) of the Act, 21 U.S.C. section 360bbb-3(b)(1), unless the authorization is terminated or revoked.     Resp Syncytial Virus by PCR NEGATIVE NEGATIVE Final    Comment: (NOTE) Fact Sheet for Patients: EntrepreneurPulse.com.au  Fact Sheet for Healthcare Providers: IncredibleEmployment.be  This test is not yet approved or cleared by the [place] FDA and has been authorized for detection  and/or diagnosis of SARS-CoV-2 by FDA under an Emergency Use Authorization (EUA). This EUA will remain in effect (meaning this test can be used) for the duration of the COVID-19 declaration under Section 564(b)(1) of the Act, 21 U.S.C. section 360bbb-3(b)(1), unless the authorization is terminated or revoked.  Performed at [facility], [address] [place], [place] [address]      Labs: CBC: Recent Labs  Lab 09/11/22 [phone] 09/13/22 0511 09/14/22 0504 09/15/22 0422 09/16/22 0439  WBC 10.5 8.1 8.0 7.8 7.9  HGB 8.7* 8.8* 8.6* 8.7* 9.1*  HCT 28.3* 28.1* 26.5* 27.7* 29.2*  MCV 87.6 85.7 84.4 85.0 85.1  PLT 228 214 221 245 315   Basic Metabolic Panel: Recent Labs  Lab 09/11/22 0420 09/13/22 0511 09/14/22 0504 09/15/22 0422 09/16/22 0439  NA 142 137 136 137 138  K 4.0 3.7 3.7 3.8 3.6  CL 112* 108 105 105 106  CO2 '22 23 23 26 26  '$ GLUCOSE 78 84 78 78 84  BUN '23 13 12 14 14  '$ CREATININE 1.20* 0.91 0.92 0.96 0.93  CALCIUM 8.2* 8.4* 8.2* 8.2* 8.5*  MG  --  1.9 1.9 2.0  --   PHOS  --  2.6 2.7 3.5  --    Liver Function Tests: Recent Labs  Lab 09/11/22 0420  AST 45*  ALT 18  ALKPHOS 48  BILITOT 1.2  PROT 6.6  ALBUMIN 2.5*   No results for input(s): "LIPASE", "AMYLASE" in the last 168 hours. No results for input(s): "AMMONIA" in the last 168 hours. Cardiac Enzymes: No results for input(s): "CKTOTAL", "CKMB", "CKMBINDEX", "TROPONINI" in the last 168  hours. BNP (last 3 results) Recent Labs    06/13/22 0115 09/09/22 0137  BNP 367.7* 113.1*   CBG: Recent Labs  Lab 09/14/22 2109 09/15/22 0802 09/15/22 1135 09/15/22 1545 09/16/22 0843  GLUCAP 97 78 96 113* 85    Time spent: 35 minutes  Signed:  Val Riles  [facility] 09/16/2022 11:27 AM

## 2022-09-15 NOTE — Care Management Important Message (Signed)
Important Message  Patient Details  Name: [name] MRN: [id number] Date of Birth: Jan 27, 1946   Medicare Important Message Given:  Yes     Dannette Barbara 09/15/2022, 10:45 AM

## 2022-09-15 NOTE — Progress Notes (Signed)
Mobility Specialist - Progress Note   09/15/22 1020  Mobility  Activity Dangled on edge of bed;Stood at bedside;Transferred from bed to chair  Level of Assistance +2 (takes two people)  Games developer wheel walker  Distance Ambulated (ft) 4 ft  Activity Response Tolerated well  Mobility Referral Yes  $Mobility charge 1 Mobility   Pt supine upon entry, requesting to sit EOB and completed leg exercises. Pt complained of BLE pain with the LLE in greater pain than the RLE. Pt completed bed mob MaxA for BLE, Mod-MinA for trunk support to get EOB. Pt sat EOB and completed BLE exercises such as leg raises, ankle pumps, and ankle circles. Pt STS to RW requiring ModA +2. Pt unable to fully stand straight, requiring ext effort to progress LE during gait with MS activity turning RW during transfer. Pt left sitting in recliner with alarm set and needs within reach.   Candie Mile Mobility Specialist 09/15/22 10:24AM

## 2022-09-16 ENCOUNTER — Ambulatory Visit: Payer: Medicare Other | Admitting: Physician Assistant

## 2022-09-16 DIAGNOSIS — L03119 Cellulitis of unspecified part of limb: Secondary | ICD-10-CM | POA: Diagnosis not present

## 2022-09-16 LAB — BASIC METABOLIC PANEL
Anion gap: 6 (ref 5–15)
BUN: 14 mg/dL (ref 8–23)
CO2: 26 mmol/L (ref 22–32)
Calcium: 8.5 mg/dL — ABNORMAL LOW (ref 8.9–10.3)
Chloride: 106 mmol/L (ref 98–111)
Creatinine, Ser: 0.93 mg/dL (ref 0.44–1.00)
GFR, Estimated: >60 mL/min (ref >60)
Glucose, Bld: 84 mg/dL (ref 70–99)
Potassium: 3.6 mmol/L (ref 3.5–5.1)
Sodium: 138 mmol/L (ref 135–145)

## 2022-09-16 LAB — CBC
HCT: 29.2 % — ABNORMAL LOW (ref 36.0–46.0)
Hemoglobin: 9.1 g/dL — ABNORMAL LOW (ref 12.0–15.0)
MCH: 26.5 pg (ref 26.0–34.0)
MCHC: 31.2 g/dL (ref 30.0–36.0)
MCV: 85.1 fL (ref 80.0–100.0)
Platelets: 283 10*3/uL (ref 150–400)
RBC: 3.43 MIL/uL — ABNORMAL LOW (ref 3.87–5.11)
RDW: 14.6 % (ref 11.5–15.5)
WBC: 7.9 10*3/uL (ref 4.0–10.5)
nRBC: 0 % (ref 0.0–0.2)

## 2022-09-16 LAB — GLUCOSE, CAPILLARY
Glucose-Capillary: 85 mg/dL (ref 70–99)
Glucose-Capillary: 99 mg/dL (ref 70–99)

## 2022-09-16 NOTE — Progress Notes (Signed)
Mobility Specialist - Progress Note   09/16/22 0953  Mobility  Activity Transferred from bed to chair;Dangled on edge of bed  Level of Assistance Moderate assist, patient does 50-74%  Assistive Device Front wheel walker  Distance Ambulated (ft) 4 ft  Activity Response Tolerated well  $Mobility charge 1 Mobility   Pt supine upon entry, utilizing RA. Pt completed bed mob ModI, extra time given from supine to sit. Pt sat EOB and completed BLE exercises, tolerated well. Pt STS to RW Min-ModA +2. Pt transferred to recliner, left sitting with alarm set and needs within reach.   Candie Mile Mobility Specialist 09/16/22 9:56 AM

## 2022-09-16 NOTE — TOC Progression Note (Addendum)
Transition of Care [facility]) - Progression Note    Patient Details  Name: [name] MRN: [id number] Date of Birth: 1946/01/06  Transition of Care [facility]) CM/SW [place], LCSW Phone Number: 09/16/2022, 9:01 AM  Clinical Narrative:   [facility] admissions assistant will check delivery status for CPAP.  11:24 am: CPAP has been delivered to the SNF. Asked MD to added yesterday's discharge summary and change discharge date to today.  Expected Discharge Plan: [place] Barriers to Discharge: Barriers Resolved  Expected Discharge Plan and Services     Post Acute Care Choice: [place] Living arrangements for the past 2 months: Apartment Expected Discharge Date: 09/15/22                                     Social Determinants of Health (SDOH) Interventions SDOH Screenings   Food Insecurity: No Food Insecurity (09/11/2022)  Housing: Low Risk  (09/11/2022)  Transportation Needs: No Transportation Needs (09/11/2022)  Utilities: Not At Risk (09/11/2022)  Tobacco Use: Medium Risk (09/09/2022)    Readmission Risk Interventions    09/12/2022   11:09 AM  Readmission Risk Prevention Plan  PCP or Specialist Appt within 3-5 Days Complete  Social Work Consult for [place] Planning/Counseling Complete  Palliative Care Screening Not Applicable

## 2022-09-16 NOTE — TOC Transition Note (Signed)
Transition of Care [facility]) - CM/SW Discharge Note   Patient Details  Name: [name] MRN: [id number] Date of Birth: 06/16/1946  Transition of Care [facility]) CM/SW Contact:  Candie Chroman, LCSW Phone Number: 09/16/2022, 11:45 AM   Clinical Narrative:   Patient has ordered to discharge to [facility] today. RN will call report to [phone] (Room [phone]). EMS transport set up for 1:00. No further concerns. CSW signing off.  Final next level of care: [place] Barriers to Discharge: Barriers Resolved   Patient Goals and CMS Choice   Choice offered to / list presented to : Patient, Adult Children  Discharge Placement     Existing PASRR number confirmed : 09/12/22          Patient chooses bed at: [facility] [place] Patient to be transferred to facility by: EMS Name of family member notified: Theodis Blaze Patient and family notified of of transfer: 09/16/22  Discharge Plan and Services Additional resources added to the After Visit Summary for       Post Acute Care Choice: [place]                               Social Determinants of Health (SDOH) Interventions SDOH Screenings   Food Insecurity: No Food Insecurity (09/11/2022)  Housing: Low Risk  (09/11/2022)  Transportation Needs: No Transportation Needs (09/11/2022)  Utilities: Not At Risk (09/11/2022)  Tobacco Use: Medium Risk (09/09/2022)     Readmission Risk Interventions    09/12/2022   11:09 AM  Readmission Risk Prevention Plan  PCP or Specialist Appt within 3-5 Days Complete  Social Work Consult for [place] Planning/Counseling Complete  Palliative Care Screening Not Applicable

## 2022-09-23 ENCOUNTER — Ambulatory Visit: Payer: Medicare Other | Admitting: Physician Assistant

## 2022-09-28 NOTE — Progress Notes (Deleted)
Cardiology Office Note    Date:  09/28/2022   ID:  [name], DOB Oct 28, 1945, MRN [id number]  PCP:  Donnie Coffin, MD  Cardiologist:  Ida Rogue, MD  Electrophysiologist:  None   Chief Complaint: ED follow-up  History of Present Illness:   [name] is a 77 y.o. 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  She previously had systolic dysfunction with an EF of 25% in 2012, but repeat echo in 2018 showed an EF of 55%.  More recently, she has had recurrent hospitalizations/ED visits as outlined below.  We last saw the patient in 06/2021.    She was admitted to the hospital in 02/2022 with syncope attributed to dehydration with AKI noted.  High-sensitivity troponin normal.  Echo showed an EF of 50 to 55%, mild LVH, moderately dilated LV internal cavity size, grade 1 diastolic dysfunction, normal RV systolic function with mildly enlarged ventricular cavity size, mild biatrial enlargement, mild mitral regurgitation, mild aortic insufficiency, and aortic valve sclerosis without evidence of stenosis.  She was admitted in 06/2022 with concern for possible volume overload in the setting of accelerated hypertension briefly treated with IV Lasix followed by worsening renal function.  High-sensitivity troponin peaked at 25.  She was seen in the ED in 08/2022 following a rapid response from radiology for bradycardia and altered mental status in the setting of FNA of her thyroid.  ED note indicates the patient had a heart rate "in the 30s then jumped to the 120s."  She reported episodes of falling asleep suddenly concerning for narcolepsy.  EKGs showed  sinus rhythm and sinus bradycardia with first-degree AV block without evidence of significant arrhythmia or high-grade AV block.  High-sensitivity troponin negative x 2.  Urine drug screen negative.  Magnesium elevated at 2.5.  TSH low with elevated free T4.  Potassium 4.7.  Outpatient cardiac monitor was recommended and remains pending at this time.  She was most recently admitted to the hospital from 1/3 through 09/16/2022 for lower extremity cellulitis and AKI.  During the admission, high-sensitivity troponin was found to be 43 and downtrending.  BNP 113.  She was treated with antibiotics per primary service and oral hydration.  She was felt to be euvolemic.  Imaging did show an incidental 2 cm exophytic lesion in the midportion of the right kidney with recommendation for patient to follow-up with PCP.  Cardiology was not involved in the patient's care throughout the above hospitalizations.  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    Breast cancer ([place]) 2019   right breast   CHF (congestive heart failure) ([place])    March 28, 2017 echo: EF 50-55%; 1) echo 07/2011: EF 20-25%, mild concentric  hypertrophy, diffuse HK, regional WMA cannot be excluded, grade 1 DD, mitral valve with mild regurg, LA mildly dilated). 2) EF 40-45%, mild AS,                     Chronic kidney disease    Complication of anesthesia 2006   pt states "hard to wake up" after L breast surgery    Edema    GERD (gastroesophageal reflux disease)     Headache    Heart murmur    Hypertension    Hypokinesis    global, EF 35-45 % from Echo.   Malignant neoplasm of breast (female), unspecified site 06/14/2018   RIGHT T1b, N0; ER/PR positive, HER-2 negative.   Neuropathy    Obesity    Pain in joint, site unspecified    Personal history of chemotherapy 2006   left breast   Personal history of radiation therapy 2019   right breast   Personal history of radiation therapy 2006   left breast   Pneumonia    Sleep apnea    Tinea pedis    Unspecified hearing loss    bilateral    Past Surgical History:  Procedure Laterality Date   BREAST BIOPSY Right 2019   11 oc-INVASIVE MAMMARY CARCINOMA, NO SPECIAL TYPE   BREAST BIOPSY Right 2019   1030 oc- neg   BREAST LUMPECTOMY  2006   left breast    CATARACT EXTRACTION     left eye   HERNIA REPAIR  05/25/2006   Incarcerated omentum in ventral hernia, Ventralight mesh, combined lap/ open with omental resection.    PARTIAL MASTECTOMY WITH NEEDLE LOCALIZATION Right 06/14/2018   Procedure: PARTIAL MASTECTOMY WITH NEEDLE LOCALIZATION;  Surgeon: Robert Bellow, MD;  Location: [facility] ORS;  Service: General;  Laterality: Right;   PORT-A-CATH REMOVAL  05/25/2006   SENTINEL NODE BIOPSY Right 06/14/2018   Procedure: SENTINEL NODE BIOPSY;  Surgeon: Robert Bellow, MD;  Location: [facility] ORS;  Service: General;  Laterality: Right;   TUBAL LIGATION      Current Medications: No outpatient medications have been marked as taking for the 09/29/22 encounter (Appointment) with Rise Mu, PA-C.    Allergies:   Patient has no known allergies.   Social History   Socioeconomic History   Marital status: Divorced    Spouse name: Not on file   Number of children: Not on file   Years of education: Not on file   Highest education level: Not on file  Occupational History   Occupation: retired  Tobacco Use   Smoking status: Former    Packs/day: 0.50    Years: 30.00    Total pack years: 15.00     Types: Cigarettes    Quit date: 07/14/2000    Years since quitting: 22.2   Smokeless tobacco: Never  Vaping Use   Vaping Use: Never used  Substance and Sexual Activity   Alcohol use: No   Drug use: Not Currently    Types: Marijuana    Comment: endorses prior marijuana use 1999   Sexual activity: Never  Other Topics Concern   Not on file  Social History Narrative   Not on file   Social Determinants of Health   Financial Resource Strain: Not on file  Food Insecurity: No Food Insecurity (09/11/2022)   Hunger Vital Sign    Worried About Running Out of Food in the Last Year: Never true    [place] in the Last Year: Never  true  Transportation Needs: No Transportation Needs (09/11/2022)   PRAPARE - Hydrologist (Medical): No    Lack of Transportation (Non-Medical): No  Physical Activity: Not on file  Stress: Not on file  Social Connections: Not on file     Family History:  The patient's family history includes Breast cancer (age of onset: 67) in an other family member; Cataracts in her mother; Coronary artery disease in an other family member; Hypertension in her mother.  ROS:   12-point review of systems is negative unless otherwise noted in the HPI.   EKGs/Labs/Other Studies Reviewed:    Studies reviewed were summarized above. The additional studies were reviewed today:  2D echo 03/04/2022: 1. Left ventricular ejection fraction, by estimation, is 50 to 55%. The  left ventricle has low normal function. Left ventricular endocardial  border not optimally defined to evaluate regional wall motion. The left  ventricular internal cavity size was  moderately dilated. There is mild left ventricular hypertrophy. Left  ventricular diastolic parameters are consistent with Grade I diastolic  dysfunction (impaired relaxation).   2. Right ventricular systolic function is normal. The right ventricular  size is mildly enlarged. Tricuspid regurgitation  signal is inadequate for  assessing PA pressure.   3. Left atrial size was mildly dilated.   4. Right atrial size was mildly dilated.   5. The mitral valve is abnormal. Mild mitral valve regurgitation. No  evidence of mitral stenosis.   6. The aortic valve has an indeterminant number of cusps. There is mild  calcification of the aortic valve. There is moderate thickening of the  aortic valve. Aortic valve regurgitation is mild. Aortic valve  sclerosis/calcification is present, without any   evidence of aortic stenosis.   7. The inferior vena cava is normal in size with <50% respiratory  variability, suggesting right atrial pressure of 8 mmHg.  __________  2D echo 03/28/2017: - Left ventricle: The cavity size was normal. Systolic function was    normal. The estimated ejection fraction was in the range of 50%    to 55%. Wall motion was normal; there were no regional wall    motion abnormalities. Doppler parameters are consistent with    abnormal left ventricular relaxation (grade 1 diastolic    dysfunction).  - Aortic valve: There was trivial regurgitation.  - Left atrium: The atrium was mildly dilated.  - Right ventricle: Systolic function was normal.  - Pulmonary arteries: Systolic pressure was within the normal    range.   Impressions:   - No cardiac source of emboli was indentified.  __________  Carotid artery ultrasound 03/28/2017: IMPRESSION: Similar appearance of trace bilateral atherosclerotic plaque resulting in less than 50% diameter stenoses bilaterally compared to 03/22/2014. No interval progression.   Vertebral arteries remain patent with antegrade flow. __________  2D echo 07/15/2011: - Left ventricle: The cavity size was mildly dilated. There    was mild concentric hypertrophy. Systolic function was    severely reduced. The estimated ejection fraction was in    the range of 20% to 25%. Diffuse hypokinesis. Regional    wall motion abnormalities cannot be  excluded. Doppler    parameters are consistent with abnormal left ventricular    relaxation (grade 1 diastolic dysfunction).  - Mitral valve: Mild regurgitation.  - Left atrium: The atrium was moderately dilated.  - Right ventricle: The cavity size was mildly dilated. Wall    thickness was normal. Systolic function was mildly    reduced.  -  Tricuspid valve: Peak RV-RA gradient:63m Hg (S).  - Pulmonary arteries: PA peak pressure: 764mHg (S).  - Inferior vena cava: The vessel was dilated; the    respirophasic diameter changes were blunted (< 50%);    findings are consistent with elevated central venous    pressure.     EKG:  EKG is ordered today.  The EKG ordered today demonstrates ***  Recent Labs: 08/22/2022: TSH 0.265 09/09/2022: B Natriuretic Peptide 113.1 09/11/2022: ALT 18 09/15/2022: Magnesium 2.0 09/16/2022: BUN 14; Creatinine, Ser 0.93; Hemoglobin 9.1; Platelets 283; Potassium 3.6; Sodium 138  Recent Lipid Panel    Component Value Date/Time   CHOL 134 09/13/2022 0511   TRIG 66 09/13/2022 0511   HDL 37 (L) 09/13/2022 0511   CHOLHDL 3.6 09/13/2022 0511   VLDL 13 09/13/2022 0511   LDLCALC 84 09/13/2022 0511    PHYSICAL EXAM:    VS:  There were no vitals taken for this visit.  BMI: There is no height or weight on file to calculate BMI.  Physical Exam  Wt Readings from Last 3 Encounters:  09/09/22 243 lb (110.2 kg)  08/22/22 243 lb (110.2 kg)  08/08/22 243 lb (110.2 kg)     ASSESSMENT & PLAN:   Sinus bradycardia with first-degree AV block:  HFimpEF:  Coronary artery calcification with history of elevated high-sensitivity troponin:  HTN: Blood pressure  HLD: LDL 84 in 09/2022 with goal being at least less than 70, with normal ALT at that time.  AKI: Renal function normal.  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  History of breast cancer with right renal mass and thyroid nodules: Follow-up with PCP and oncology.   {Are you ordering a CV Procedure (e.g.  stress test, cath, DCCV, TEE, etc)?   Press F2        :[id number]   Disposition: F/u with Dr. GoRockey Situr an APP in ***.   Medication Adjustments/Labs and Tests Ordered: Current medicines are reviewed at length with the patient today.  Concerns regarding medicines are outlined above. Medication changes, Labs and Tests ordered today are summarized above and listed in the Patient Instructions accessible in Encounters.   Signed, RyChristell FaithPA-C 09/28/2022 7:50 AM     [place] [id number]

## 2022-09-29 ENCOUNTER — Ambulatory Visit: Payer: Medicare Other | Admitting: Physician Assistant

## 2022-09-30 ENCOUNTER — Ambulatory Visit: Payer: Medicare Other | Admitting: Physician Assistant

## 2022-10-07 ENCOUNTER — Ambulatory Visit: Payer: Medicare Other | Admitting: Physician Assistant

## 2022-10-16 ENCOUNTER — Encounter: Payer: Medicare Other | Admitting: Family

## 2022-10-16 NOTE — Progress Notes (Deleted)
Patient ID: [name], female    DOB: 1945-09-10, 77 y.o.   MRN: [id number]  HPI  [name] is a 77 y/o female with a history of breast cancer, CKD, HTN, GERD, sleep apnea, previous tobacco use and chronic heart failure.   Echo report from 03/04/22 reviewed and showed an EF of 50-55% along with mild LVH/ LAE and mild MR.   Admitted 09/10/22 due to lower extremity cellulitis. Was in the ED 08/22/22 due to near syncope. Admitted 06/12/22 due to acute on chronic CHF with volume overload. Initially given IV lasix with transition to oral diuretics. HTN emergency and meds adjusted. Elevated troponin thought to be due to demand ischemia. Weaned off oxygen to room air. Discharged after 3 days.   She presents today for a follow-up visit with a chief complaint of   Unable to consistently weigh herself because of her instability when standing.   Goes to the wound center for her lymphedema and has her legs wrapped 1-2 times/ week.   Past Medical History:  Diagnosis Date   Abnormality of gait    Acute respiratory failure with hypoxia ([place]) 06/13/2022   Breast cancer ([place]) 02/05/2005   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    Breast cancer ([place]) 2019   right breast   CHF (congestive heart failure) ([place])    March 28, 2017 echo: EF 50-55%; 1) echo 07/2011: EF 20-25%, mild concentric hypertrophy, diffuse HK, regional WMA cannot be excluded, grade 1 DD, mitral valve with mild regurg, LA mildly dilated). 2) EF 40-45%, mild AS,                     Chronic kidney disease    Complication of anesthesia 2006   pt states "hard to wake up" after L breast surgery    Edema    GERD (gastroesophageal reflux disease)    Headache    Heart murmur    Hypertension    Hypokinesis    global, EF 35-45 % from Echo.   Malignant neoplasm of breast (female), unspecified site 06/14/2018   RIGHT T1b, N0; ER/PR positive, HER-2 negative.    Neuropathy    Obesity    Pain in joint, site unspecified    Personal history of chemotherapy 2006   left breast   Personal history of radiation therapy 2019   right breast   Personal history of radiation therapy 2006   left breast   Pneumonia    Sleep apnea    Tinea pedis    Unspecified hearing loss    bilateral   Past Surgical History:  Procedure Laterality Date   BREAST BIOPSY Right 2019   11 oc-INVASIVE MAMMARY CARCINOMA, NO SPECIAL TYPE   BREAST BIOPSY Right 2019   1030 oc- neg   BREAST LUMPECTOMY  2006   left breast    CATARACT EXTRACTION     left eye   HERNIA REPAIR  05/25/2006   Incarcerated omentum in ventral hernia, Ventralight mesh, combined lap/ open with omental resection.    PARTIAL MASTECTOMY WITH NEEDLE LOCALIZATION Right 06/14/2018   Procedure: PARTIAL MASTECTOMY WITH NEEDLE LOCALIZATION;  Surgeon: Robert Bellow, MD;  Location: [facility] ORS;  Service: General;  Laterality: Right;   PORT-A-CATH REMOVAL  05/25/2006   SENTINEL NODE BIOPSY Right 06/14/2018   Procedure: SENTINEL NODE BIOPSY;  Surgeon: Robert Bellow, MD;  Location: [facility] ORS;  Service: General;  Laterality: Right;   TUBAL  LIGATION     Family History  Problem Relation Age of Onset   Hypertension Mother        deceased 51   Cataracts Mother    Breast cancer Other 69       maternal half-sister; deceased 38   Coronary artery disease Other    Social History   Tobacco Use   Smoking status: Former    Packs/day: 0.50    Years: 30.00    Total pack years: 15.00    Types: Cigarettes    Quit date: 07/14/2000    Years since quitting: 22.2   Smokeless tobacco: Never  Substance Use Topics   Alcohol use: No   No Known Allergies    Review of Systems  Constitutional:  Positive for appetite change (decreased) and fatigue.  HENT:  Negative for congestion, postnasal drip and sore throat.   Eyes:  Positive for visual disturbance (cataracts).  Respiratory:  Positive for cough and shortness of  breath. Negative for wheezing.   Cardiovascular:  Positive for leg swelling. Negative for chest pain and palpitations.  Gastrointestinal:  Negative for abdominal distention and abdominal pain.  Endocrine: Negative.   Genitourinary: Negative.   Musculoskeletal:  Negative for back pain and neck pain.  Skin:  Positive for wound (on both legs).  Allergic/Immunologic: Negative.   Neurological:  Positive for dizziness and light-headedness.  Hematological:  Negative for adenopathy. Does not bruise/bleed easily.  Psychiatric/Behavioral:  Positive for sleep disturbance (chronic difficulty sleeping; sleeping on 2 pillows). Negative for dysphoric mood. The patient is nervous/anxious.      Physical Exam Vitals and nursing note reviewed. Exam conducted with a chaperone present (daughter).  Constitutional:      Appearance: Normal appearance.  HENT:     Head: Normocephalic and atraumatic.  Cardiovascular:     Rate and Rhythm: Normal rate and regular rhythm.  Pulmonary:     Effort: Pulmonary effort is normal. No respiratory distress.     Breath sounds: No wheezing or rales.  Abdominal:     General: There is no distension.     Palpations: Abdomen is soft.     Tenderness: There is no abdominal tenderness.  Musculoskeletal:        General: No tenderness.     Cervical back: Normal range of motion and neck supple.     Right lower leg: Edema (wrapped in [employer]) present.     Left lower leg: Edema (wrapped in [employer]) present.  Skin:    General: Skin is warm and dry.  Neurological:     General: No focal deficit present.     Mental Status: She is alert and oriented to person, place, and time.  Psychiatric:        Mood and Affect: Mood is anxious. Mood is not depressed.    Assessment & Plan:  1: Chronic heart failure with preserved ejection fraction with structural changes (LVH/LAE)- - NYHA class III - euvolemic today - weighing when she can safely weigh; reminded to call for an overnight  weight gain of > 2 pounds or a weekly weight gain of > 5 pounds - weight 243 pounds from last visit here 2 months ago - not adding salt  - unclear of fluid intake - on GDMT of losartan & jardiance - BNP 09/09/22 was 113.1  2: HTN with CKD- - BP  - sees PCP ([facility]) but is currently seeing PCP at SNF - BMP 09/16/22 reviewed and showed sodium 138, potassium 3.6, creatinine 0.93 and  GFR >60  3: OSA- - last sleep study was done 5 years ago and she's needing new supplies so that she can start wearing it  4: Breast cancer- - saw oncology Janese Banks) 07/21/22 - currently taking arimidex  5: Lymphedema- - stage 2 - encouraged her to elevate her legs when sitting for long periods of time - legs are currently wrapped in [employer] boots - went to wound center 08/26/22 - limited in her ability to exercise - consider compression boots once she no longer needs [employer] boots   Patient did not bring her medications nor a list. Each medication was verbally reviewed with the patient and she was encouraged to bring the bottles to every visit to confirm accuracy of list.

## 2022-10-27 ENCOUNTER — Encounter: Payer: Self-pay | Admitting: Family

## 2022-10-27 ENCOUNTER — Ambulatory Visit: Payer: Medicare Other | Attending: Family | Admitting: Family

## 2022-10-27 VITALS — BP 122/57 | HR 82 | Resp 16

## 2022-10-27 DIAGNOSIS — I5032 Chronic diastolic (congestive) heart failure: Secondary | ICD-10-CM | POA: Diagnosis not present

## 2022-10-27 DIAGNOSIS — N189 Chronic kidney disease, unspecified: Secondary | ICD-10-CM | POA: Insufficient documentation

## 2022-10-27 DIAGNOSIS — M25519 Pain in unspecified shoulder: Secondary | ICD-10-CM | POA: Insufficient documentation

## 2022-10-27 DIAGNOSIS — Z170 Estrogen receptor positive status [ER+]: Secondary | ICD-10-CM

## 2022-10-27 DIAGNOSIS — C50911 Malignant neoplasm of unspecified site of right female breast: Secondary | ICD-10-CM

## 2022-10-27 DIAGNOSIS — G4733 Obstructive sleep apnea (adult) (pediatric): Secondary | ICD-10-CM | POA: Diagnosis not present

## 2022-10-27 DIAGNOSIS — Z79899 Other long term (current) drug therapy: Secondary | ICD-10-CM | POA: Diagnosis not present

## 2022-10-27 DIAGNOSIS — I130 Hypertensive heart and chronic kidney disease with heart failure and stage 1 through stage 4 chronic kidney disease, or unspecified chronic kidney disease: Secondary | ICD-10-CM | POA: Diagnosis not present

## 2022-10-27 DIAGNOSIS — I890 Lymphedema, not elsewhere classified: Secondary | ICD-10-CM | POA: Diagnosis not present

## 2022-10-27 DIAGNOSIS — Z87891 Personal history of nicotine dependence: Secondary | ICD-10-CM | POA: Insufficient documentation

## 2022-10-27 DIAGNOSIS — I10 Essential (primary) hypertension: Secondary | ICD-10-CM | POA: Diagnosis not present

## 2022-10-27 DIAGNOSIS — Z8249 Family history of ischemic heart disease and other diseases of the circulatory system: Secondary | ICD-10-CM | POA: Diagnosis not present

## 2022-10-27 DIAGNOSIS — G629 Polyneuropathy, unspecified: Secondary | ICD-10-CM | POA: Diagnosis not present

## 2022-10-27 DIAGNOSIS — C50919 Malignant neoplasm of unspecified site of unspecified female breast: Secondary | ICD-10-CM | POA: Diagnosis not present

## 2022-10-27 DIAGNOSIS — Z803 Family history of malignant neoplasm of breast: Secondary | ICD-10-CM | POA: Insufficient documentation

## 2022-10-27 NOTE — Progress Notes (Signed)
Patient ID: [name], female    DOB: Apr 21, 1946, 77 y.o.   MRN: [id number]  HPI  [name] is a 77 y/o female with a history of breast cancer, CKD, HTN, GERD, sleep apnea, previous tobacco use and chronic heart failure.   Echo report from 03/04/22 reviewed and showed an EF of 50-55% along with mild LVH/ LAE and mild MR.   Admitted 09/10/22 due to lower extremity cellulitis. Was in the ED 08/22/22 due to near syncope. Admitted 06/12/22 due to acute on chronic CHF with volume overload. Initially given IV lasix with transition to oral diuretics. HTN emergency and meds adjusted. Elevated troponin thought to be due to demand ischemia. Weaned off oxygen to room air. Discharged after 3 days.   She presents today for a follow-up visit with a chief complaint of moderate fatigue with little exertion. Describes this as chronic in nature. Has associated decreased appetite, dry cough, SOB, lymphedema, dizziness, neuropathy pain in feet, anxiety, difficulty sleeping and shoulder pain along with this. Denies any abdominal distention, palpitations, chest pain or wheezing.   Unable to consistently weigh herself because of her instability when standing.   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    Breast cancer ([place]) 2019   right breast   CHF (congestive heart failure) ([place])    March 28, 2017 echo: EF 50-55%; 1) echo 07/2011: EF 20-25%, mild concentric hypertrophy, diffuse HK, regional WMA cannot be excluded, grade 1 DD, mitral valve with mild regurg, LA mildly dilated). 2) EF 40-45%, mild AS,                     Chronic kidney disease    Complication of anesthesia 2006   pt states "hard to wake up" after L breast surgery    Edema    GERD (gastroesophageal reflux  disease)    Headache    Heart murmur    Hypertension    Hypokinesis    global, EF 35-45 % from Echo.   Malignant neoplasm of breast (female), unspecified site 06/14/2018   RIGHT T1b, N0; ER/PR positive, HER-2 negative.   Neuropathy    Obesity    Pain in joint, site unspecified    Personal history of chemotherapy 2006   left breast   Personal history of radiation therapy 2019   right breast   Personal history of radiation therapy 2006   left breast   Pneumonia    Sleep apnea    Tinea pedis    Unspecified hearing loss    bilateral   Past Surgical History:  Procedure Laterality Date   BREAST BIOPSY Right 2019   11 oc-INVASIVE MAMMARY CARCINOMA, NO SPECIAL TYPE   BREAST BIOPSY Right 2019   1030 oc- neg   BREAST LUMPECTOMY  2006   left breast    CATARACT EXTRACTION     left eye   HERNIA REPAIR  05/25/2006   Incarcerated omentum in ventral hernia, Ventralight mesh, combined lap/ open with omental resection.    PARTIAL MASTECTOMY WITH NEEDLE LOCALIZATION Right 06/14/2018   Procedure: PARTIAL MASTECTOMY WITH NEEDLE LOCALIZATION;  Surgeon: Robert Bellow, MD;  Location: [facility] ORS;  Service: General;  Laterality: Right;   PORT-A-CATH REMOVAL  05/25/2006   SENTINEL NODE BIOPSY Right 06/14/2018  Procedure: SENTINEL NODE BIOPSY;  Surgeon: Robert Bellow, MD;  Location: [facility] ORS;  Service: General;  Laterality: Right;   TUBAL LIGATION     Family History  Problem Relation Age of Onset   Hypertension Mother        deceased 59   Cataracts Mother    Breast cancer Other 24       maternal half-sister; deceased 41   Coronary artery disease Other    Social History   Tobacco Use   Smoking status: Former    Packs/day: 0.50    Years: 30.00    Total pack years: 15.00    Types: Cigarettes    Quit date: 07/14/2000    Years since quitting: 22.3   Smokeless tobacco: Never  Substance Use Topics   Alcohol use: No   No Known Allergies  Prior to Admission medications    Medication Sig Start Date End Date Taking? Authorizing Provider  acetaminophen (TYLENOL) 325 MG tablet Take 2 tablets (650 mg total) by mouth every 6 (six) hours as needed for mild pain (or Fever >/= 101). 09/15/22  Yes Val Riles, MD  ascorbic acid (VITAMIN C) 500 MG tablet Take 500 mg by mouth 2 (two) times daily.   Yes [provider]  aspirin EC 81 MG tablet Take 81 mg by mouth daily.   Yes [provider]  bisacodyl (DULCOLAX) 5 MG EC tablet Take 2 tablets (10 mg total) by mouth at bedtime. Skip the dose if no constipation 09/15/22  Yes Val Riles, MD  cloNIDine (CATAPRES) 0.1 MG tablet Take 1 tablet (0.1 mg total) by mouth 2 (two) times daily as needed (systolic BP > [id number] or diastolic BP > 95). 08/23/22  Yes Sharen Hones, MD  [employer] CALCIUM + D3 600-20 MG-MCG TABS TAKE 1 TABLET BY MOUTH TWICE A DAY 02/04/22  Yes Sindy Guadeloupe, MD  diclofenac Sodium (VOLTAREN) 1 % GEL Apply 1 application topically as needed. 04/27/20  Yes [provider]  Emollient (EUCERIN) lotion Apply topically 2 (two) times daily. Apply to dry skin especially bilateral lower extremities and arms. 09/15/22  Yes Val Riles, MD  empagliflozin (JARDIANCE) 10 MG TABS tablet Take 1 tablet (10 mg total) by mouth daily before breakfast. 08/23/22  Yes Sharen Hones, MD  gabapentin (NEURONTIN) 300 MG capsule Take 300 mg by mouth 3 (three) times daily.   Yes [provider]  Incontinence Supply Disposable (BLADDER CONTROL PADS EX ABSORB) MISC Apply 1 Dose topically as needed.  03/22/18  Yes [provider]  isosorbide mononitrate (IMDUR) 60 MG 24 hr tablet Take 1 tablet (60 mg total) by mouth 2 (two) times daily. 08/23/22  Yes Sharen Hones, MD  loratadine (CLARITIN) 10 MG tablet Take 10 mg by mouth daily. 07/20/19  Yes [provider]  losartan (COZAAR) 25 MG tablet Take 25 mg by mouth daily. 08/28/22  Yes [provider]  omeprazole (PRILOSEC) 40 MG capsule Take 40 mg by  mouth daily. 04/13/21  Yes [provider]  oxybutynin (DITROPAN) 5 MG tablet Take 5 mg by mouth 2 (two) times daily.   Yes [provider]  polyethylene glycol (MIRALAX / GLYCOLAX) 17 g packet Take 17 g by mouth daily. Skip the dose if no constipation 09/15/22  Yes Val Riles, MD  anastrozole (ARIMIDEX) 1 MG tablet TAKE 1 TABLET BY MOUTH ONCE DAILY. MUST MAKE AN APPOINTMENT TO SEE DR. RAO. 08/14/22   Sindy Guadeloupe, MD   Review of Systems  Constitutional:  Positive for appetite change (decreased) and fatigue.  HENT:  Negative for congestion, postnasal drip and sore throat.   Eyes:  Positive for visual disturbance (cataracts).  Respiratory:  Positive for cough (dry) and shortness of breath. Negative for chest tightness and wheezing.   Cardiovascular:  Positive for leg swelling. Negative for chest pain and palpitations.  Gastrointestinal:  Negative for abdominal distention and abdominal pain.  Endocrine: Negative.   Genitourinary: Negative.   Musculoskeletal:  Positive for arthralgias (bilateral shoulder pain). Negative for back pain and neck pain.  Skin:  Positive for wound (on both legs).  Allergic/Immunologic: Negative.   Neurological:  Positive for dizziness, light-headedness and numbness (in both feet (neuropathy)).  Hematological:  Negative for adenopathy. Does not bruise/bleed easily.  Psychiatric/Behavioral:  Positive for sleep disturbance (chronic difficulty sleeping; sleeping on 2 pillows). Negative for dysphoric mood. The patient is nervous/anxious.    Vitals:   10/27/22 0955  BP: (!) 122/57  Pulse: 82  Resp: 16  SpO2: 98%   Wt Readings from Last 3 Encounters:  09/09/22 243 lb (110.2 kg)  08/22/22 243 lb (110.2 kg)  08/08/22 243 lb (110.2 kg)   Lab Results  Component Value Date   CREATININE 0.93 09/16/2022   CREATININE 0.96 09/15/2022   CREATININE 0.92 09/14/2022   Physical Exam Vitals and nursing note reviewed.  Constitutional:      Appearance:  Normal appearance.  HENT:     Head: Normocephalic and atraumatic.  Cardiovascular:     Rate and Rhythm: Normal rate and regular rhythm.  Pulmonary:     Effort: Pulmonary effort is normal. No respiratory distress.     Breath sounds: No wheezing or rales.  Abdominal:     General: There is no distension.     Palpations: Abdomen is soft.     Tenderness: There is no abdominal tenderness.  Musculoskeletal:        General: No tenderness.     Cervical back: Normal range of motion and neck supple.     Right lower leg: Edema (lymphedema) present.     Left lower leg: Edema (lymphedema) present.  Skin:    General: Skin is warm and dry.  Neurological:     General: No focal deficit present.     Mental Status: She is alert and oriented to person, place, and time.  Psychiatric:        Mood and Affect: Mood is anxious. Mood is not depressed.    Assessment & Plan:  1: Chronic heart failure with preserved ejection fraction with structural changes (LVH/LAE)- - NYHA class III - euvolemic today - last weight at SNF on 2/4 was 237.5; order written for daily weights and to call for an overnight weight gain of > 2 pounds or a weekly weight gain of > 5 pounds - not weighed in clinic today due to instability and patient request - not adding salt  - unclear of fluid intake - jardiance 70m dailiy - losartan 272mdaily - currently not on diuretic and patient doesn't want one as she's already "peeing all the time as it is" - does elevate her legs in the bed some during the day and then overnight weight with some improvement - BNP 09/09/22 was 113.1  2: HTN with CKD- - BP 122/57 - sees PCP (Aycock) but is currently seeing PCP at SNF although she says that she plans to make an appt with Dr. AyClide Deutschernd her daughter will take her - BMP 09/16/22 reviewed and showed sodium 138,  potassium 3.6, creatinine 0.93 and GFR >60  3: OSA- - last sleep study was done 5 years ago and she's needing new supplies so that she  can start wearing it  4: Breast cancer- - saw oncology Janese Banks) 07/21/22 - has to see Dr. Janese Banks before arimidex is refilled  5: Lymphedema- - stage 2 - encouraged her to elevate her legs when sitting for long periods of time - went to wound center 08/26/22 & would like to return for wrapping for the edema - order written for wound center appt  - limited in her ability to exercise   Facility medication list reviewed.   Return in 3 months, sooner if needed

## 2022-10-27 NOTE — Patient Instructions (Signed)
Continue weighing daily and call for an overnight weight gain of 3 pounds or more or a weekly weight gain of more than 5 pounds. ? ? ?If you have voicemail, please make sure your mailbox is cleaned out so that we may leave a message and please make sure to listen to any voicemails.  ? ? ?

## 2022-11-14 ENCOUNTER — Ambulatory Visit: Payer: Medicare Other | Admitting: Internal Medicine

## 2022-11-25 ENCOUNTER — Encounter: Payer: Medicare Other | Attending: Physician Assistant | Admitting: Physician Assistant

## 2022-11-25 DIAGNOSIS — I872 Venous insufficiency (chronic) (peripheral): Secondary | ICD-10-CM | POA: Diagnosis not present

## 2022-11-25 DIAGNOSIS — Z853 Personal history of malignant neoplasm of breast: Secondary | ICD-10-CM | POA: Diagnosis not present

## 2022-11-25 DIAGNOSIS — I130 Hypertensive heart and chronic kidney disease with heart failure and stage 1 through stage 4 chronic kidney disease, or unspecified chronic kidney disease: Secondary | ICD-10-CM | POA: Insufficient documentation

## 2022-11-25 DIAGNOSIS — M1990 Unspecified osteoarthritis, unspecified site: Secondary | ICD-10-CM | POA: Diagnosis not present

## 2022-11-25 DIAGNOSIS — Z923 Personal history of irradiation: Secondary | ICD-10-CM | POA: Insufficient documentation

## 2022-11-25 DIAGNOSIS — I5042 Chronic combined systolic (congestive) and diastolic (congestive) heart failure: Secondary | ICD-10-CM | POA: Diagnosis not present

## 2022-11-25 DIAGNOSIS — Z9221 Personal history of antineoplastic chemotherapy: Secondary | ICD-10-CM | POA: Insufficient documentation

## 2022-11-25 DIAGNOSIS — G4730 Sleep apnea, unspecified: Secondary | ICD-10-CM | POA: Diagnosis not present

## 2022-11-25 DIAGNOSIS — N1831 Chronic kidney disease, stage 3a: Secondary | ICD-10-CM | POA: Diagnosis not present

## 2022-11-25 DIAGNOSIS — I890 Lymphedema, not elsewhere classified: Secondary | ICD-10-CM | POA: Diagnosis present

## 2022-11-26 NOTE — Progress Notes (Signed)
MEYA, TROSCLAIR ([id number]) [id number] Page 1 of 7 Visit Report for 11/25/2022 Allergy List Details Patient Name: Date of Service: [name] 11/25/2022 2:45 PM Medical Record Number: [id number] Patient Account Number: [id number] Date of Birth/Sex: Treating RN: 07/12/1946 (77 y.o. Drema Pry Primary Care Christhoper Busbee: [name] Other Clinician: Referring Deone Omahoney: Treating Larah Kuntzman/Extender: [name] Self, Referral Weeks in Treatment: 0 Allergies Active Allergies No Known Drug Allergies Allergy Notes Electronic Signature(s) Signed: 11/26/2022 8:15:46 AM By: Rosalio Loud MSN RN CNS WTA Entered By: Rosalio Loud on 11/25/2022 14:59:18 -------------------------------------------------------------------------------- Arrival Information Details Patient Name: Date of Service: [name] 11/25/2022 2:45 PM Medical Record Number: [id number] Patient Account Number: [id number] Date of Birth/Sex: Treating RN: 04-27-1946 (77 y.o. Drema Pry Primary Care Tracyann Duffell: [name] Other Clinician: Referring Dlisa Barnwell: Treating Panayiotis Rain[place]/Extender: [name] Self, Referral Weeks in Treatment: 0 Visit Information Patient Arrived: Wheel Chair Arrival Time: 14:45 Accompanied By: self Transfer Assistance: None Patient Identification Verified: Yes Secondary Verification Process Completed: Yes Patient Requires Transmission-Based Precautions: No Patient Has Alerts: Yes Patient Alerts: Prediabetic History Since Last Visit Added or deleted any medications: No Pain Present Now: No Electronic Signature(s) Signed: 11/26/2022 8:15:46 AM By: Rosalio Loud MSN RN CNS WTA Entered By: Rosalio Loud on 11/25/2022 14:51:20 [name] ([id number]) [id number] Page 2 of 7 -------------------------------------------------------------------------------- Clinic Level of Care Assessment Details Patient Name: Date of Service: [name] 11/25/2022 2:45 PM Medical Record Number: [id number] Patient Account Number: [id number] Date of Birth/Sex: Treating RN: [birth year] (77 y.o. Drema Pry Primary Care Shondell Fabel: [name] Other Clinician: Referring Cinque Begley: Treating Zebulan Hinshaw/Extender: [name] Self, Referral Weeks in Treatment: 0 Clinic Level of Care Assessment Items TOOL 2 Quantity Score X- 1 0 Use when only an EandM is performed on the INITIAL visit ASSESSMENTS - Nursing Assessment / Reassessment X- 1 20 General Physical Exam (combine w/ comprehensive assessment (listed just below) when performed on new pt. evals) X- 1 25 Comprehensive Assessment (HX, ROS, Risk Assessments, Wounds Hx, etc.) ASSESSMENTS - Wound and Skin A ssessment / Reassessment []  - 0 Simple Wound Assessment / Reassessment - one wound []  - 0 Complex Wound Assessment / Reassessment - multiple wounds []  - 0 Dermatologic / Skin Assessment (not related to wound area) ASSESSMENTS - Ostomy and/or Continence Assessment and Care []  - 0 Incontinence Assessment and Management []  - 0 Ostomy Care Assessment and Management (repouching, etc.) PROCESS - Coordination of Care X - Simple Patient / Family Education for ongoing care 1 15 []  - 0 Complex (extensive) Patient / Family Education for ongoing care X- 1 10 Staff obtains Programmer, systems, Records, T Results / Process Orders est []  - 0 Staff telephones HHA, Nursing Homes / Clarify orders / etc []  - 0 Routine Transfer to another Facility (non-emergent condition) []  - 0 Routine Hospital Admission (non-emergent condition) []  - 0 New Admissions / Biomedical engineer / Ordering NPWT Apligraf, etc. , []  - 0 Emergency Hospital Admission (emergent condition) X- 1 10 Simple Discharge Coordination []  - 0 Complex (extensive) Discharge Coordination PROCESS - Special Needs []  - 0 Pediatric / Minor Patient Management []  - 0 Isolation Patient Management []  - 0 Hearing / Language /  Visual special needs []  - 0 Assessment of Community assistance (transportation, D/C planning, etc.) []  - 0 Additional assistance / Altered mentation []  - 0 Support Surface(s) Assessment (bed, cushion, seat, etc.) INTERVENTIONS - Wound Cleansing / Measurement [name], [name] ([id number]) [id number] Page 3 of 7 []  - 0 Wound  Imaging (photographs - any number of wounds) []  - 0 Wound Tracing (instead of photographs) []  - 0 Simple Wound Measurement - one wound []  - 0 Complex Wound Measurement - multiple wounds []  - 0 Simple Wound Cleansing - one wound []  - 0 Complex Wound Cleansing - multiple wounds INTERVENTIONS - Wound Dressings []  - 0 Small Wound Dressing one or multiple wounds []  - 0 Medium Wound Dressing one or multiple wounds []  - 0 Large Wound Dressing one or multiple wounds []  - 0 Application of Medications - injection INTERVENTIONS - Miscellaneous []  - 0 External ear exam []  - 0 Specimen Collection (cultures, biopsies, blood, body fluids, etc.) []  - 0 Specimen(s) / Culture(s) sent or taken to Lab for analysis []  - 0 Patient Transfer (multiple staff / Civil Service fast streamer / Similar devices) []  - 0 Simple Staple / Suture removal (25 or less) []  - 0 Complex Staple / Suture removal (26 or more) []  - 0 Hypo / Hyperglycemic Management (close monitor of Blood Glucose) X- 1 15 Ankle / Brachial Index (ABI) - do not check if billed separately Has the patient been seen at the hospital within the last three years: Yes Total Score: 95 Level Of Care: New/Established - Level 3 Electronic Signature(s) Signed: 11/26/2022 8:15:46 AM By: Rosalio Loud MSN RN CNS WTA Entered By: Rosalio Loud on 11/25/2022 15:25:31 -------------------------------------------------------------------------------- Encounter Discharge Information Details Patient Name: Date of Service: [name] 11/25/2022 2:45 PM Medical Record Number: [id number] Patient Account Number:  [id number] Date of Birth/Sex: Treating RN: 1946/01/20 (77 y.o. Drema Pry Primary Care Shilah Hefel: [name] Other Clinician: Referring Jeniah Kishi: Treating Kele Withem/Extender: [name] Self, Referral Weeks in Treatment: 0 Encounter Discharge Information Items Discharge Condition: Stable Ambulatory Status: Wheelchair Discharge Destination: Home Transportation: Other Accompanied By: self Schedule Follow-up Appointment: No Clinical Summary of Care: [name], [name] ([id number]) [id number] Page 4 of 7 Electronic Signature(s) Signed: 11/26/2022 8:15:46 AM By: Rosalio Loud MSN RN CNS WTA Entered By: Rosalio Loud on 11/25/2022 15:32:44 -------------------------------------------------------------------------------- Lower Extremity Assessment Details Patient Name: Date of Service: [name] 11/25/2022 2:45 PM Medical Record Number: [id number] Patient Account Number: [id number] Date of Birth/Sex: Treating RN: [birth year] (77 y.o. Drema Pry Primary Care Kjersten Ormiston: [name] Other Clinician: Referring Talea Manges: Treating Izeah Vossler/Extender: [name] Self, Referral Weeks in Treatment: 0 Edema Assessment Assessed: [Left: No] [Right: No] Edema: [Left: Yes] [Right: Yes] Calf Left: Right: Point of Measurement: 35 cm From Medial Instep 49.5 cm 49 cm Ankle Left: Right: Point of Measurement: 12 cm From Medial Instep 27.5 cm 27 cm Knee To Floor Left: Right: From Medial Instep 41 cm 41 cm Vascular Assessment Pulses: Dorsalis Pedis Palpable: [Left:No] [Right:No] Blood Pressure: Brachial: [Left:164] [Right:164] Ankle: [Left:Dorsalis Pedis: 202 1.23] [Right:Dorsalis Pedis: 202 1.23] Electronic Signature(s) Signed: 11/26/2022 8:15:46 AM By: Rosalio Loud MSN RN CNS WTA Entered By: Rosalio Loud on 11/25/2022 15:20:53 Multi Wound Chart Details -------------------------------------------------------------------------------- [name]  ([id number]) [id number] Page 5 of 7 Patient Name: Date of Service: [name] 11/25/2022 2:45 PM Medical Record Number: [id number] Patient Account Number: [id number] Date of Birth/Sex: Treating RN: 21-Nov-1945 (77 y.o. Drema Pry Primary Care Amyre Segundo: [name] Other Clinician: Referring Brentley Landfair: Treating Ginnifer Creelman/Extender: [name] Self, Referral Weeks in Treatment: 0 Vital Signs Height(in): Pulse(bpm): 75 Weight(lbs): Blood Pressure(mmHg): 150/76 Body Mass Index(BMI): Temperature(F): 97.8 Respiratory Rate(breaths/min): 16 Wound Assessments Treatment Notes Electronic Signature(s) Signed: 11/26/2022 8:15:46 AM By: Rosalio Loud MSN RN CNS WTA Entered By: Rosalio Loud on 11/25/2022  15:02:26 -------------------------------------------------------------------------------- Pain Assessment Details Patient Name: Date of Service: [name] 11/25/2022 2:45 PM Medical Record Number: [id number] Patient Account Number: [id number] Date of Birth/Sex: Treating RN: 29-Jul-1946 (77 y.o. Drema Pry Primary Care Elgar Scoggins: [name] Other Clinician: Referring Martine Bleecker: Treating Neftaly Swiss/Extender: [name] Self, Referral Weeks in Treatment: 0 Active Problems Location of Pain Severity and Description of Pain Patient Has Paino Yes Site Locations Pain Location: Generalized Pain Rate the pain. Current Pain Level: 5 Worst Pain Level: 8 Least Pain Level: 2 Tolerable Pain Level: 3 Character of Pain Describe the Pain: Aching, Burning, Heavy, Shooting, Throbbing Pain Management and Medication Current Pain Management: Medication: [name], [name] ([id number]) [id number] Page 6 of 7 Electronic Signature(s) Signed: 11/26/2022 8:15:46 AM By: Rosalio Loud MSN RN CNS WTA Entered By: Rosalio Loud on 11/25/2022  14:52:19 -------------------------------------------------------------------------------- Patient/Caregiver Education Details Patient Name: Date of Service: [name] 3/19/2024andnbsp2:45 PM Medical Record Number: [id number] Patient Account Number: [id number] Date of Birth/Gender: Treating RN: February 22, 1946 (77 y.o. Drema Pry Primary Care Physician: [name] Other Clinician: Referring Physician: Treating Physician/Extender: [name] Self, Referral Weeks in Treatment: 0 Education Assessment Education Provided To: Patient Education Topics Provided Wound/Skin Impairment: Handouts: Other: Lymphedema Methods: Explain/Verbal Responses: State content correctly Electronic Signature(s) Signed: 11/26/2022 8:15:46 AM By: Rosalio Loud MSN RN CNS WTA Entered By: Rosalio Loud on 11/25/2022 15:26:09 -------------------------------------------------------------------------------- Vitals Details Patient Name: Date of Service: [name] 11/25/2022 2:45 PM Medical Record Number: [id number] Patient Account Number: [id number] Date of Birth/Sex: Treating RN: 03-09-1946 (77 y.o. Drema Pry Primary Care Cyler Kappes: [name] Other Clinician: Referring Lorrin Bodner: Treating Darel Ricketts/Extender: [name] Self, Referral Weeks in Treatment: 0 Vital Signs Time Taken: 14:52 Temperature (F): 97.8 Unable to obtain height and weight: Medical Reason Pulse (bpm): 75 Respiratory Rate (breaths/min): 16 Blood Pressure (mmHg): 150/76 AMAJAH, [name] ([id number]) [id number] Page 7 of 7 Reference Range: 80 - 120 mg / dl Electronic Signature(s) Signed: 11/26/2022 8:15:46 AM By: Rosalio Loud MSN RN CNS WTA Entered By: Rosalio Loud on 11/25/2022 14:55:58

## 2022-11-26 NOTE — Progress Notes (Signed)
TIMIKA, SETARO ([id number]) [id number] Page 1 of 15 Visit Report for 11/25/2022 Chief Complaint Document Details Patient Name: Date of Service: [name] 11/25/2022 2:45 PM Medical Record Number: [id number] Patient Account Number: [id number] Date of Birth/Sex: Treating RN: 02/01/1946 (77 y.o. [name] Primary Care Provider: [name] Other Clinician: Referring Provider: Treating Provider/Extender: [name] Self, Referral Weeks in Treatment: 0 Information Obtained from: Patient Chief Complaint Bilateral Lymphedema no Wounds Electronic Signature(s) Signed: 11/25/2022 3:12:53 PM By: Worthy Keeler PA-C Entered By: Worthy Keeler on 11/25/2022 15:12:52 -------------------------------------------------------------------------------- HPI Details Patient Name: Date of Service: [name] 11/25/2022 2:45 PM Medical Record Number: [id number] Patient Account Number: [id number] Date of Birth/Sex: Treating RN: 11-Mar-1946 (77 y.o. [name] Primary Care Provider: [name] Other Clinician: Referring Provider: Treating Provider/Extender: [name] Self, Referral Weeks in Treatment: 0 History of Present Illness HPI Description: 03/15/2020 upon evaluation today patient presents today for initial evaluation here in our clinic concerning a wound that is on the left posterior lower extremity. Unfortunately this has been giving the patient some discomfort at this point which she notes has been affected her pretty much daily. The patient does have a history of venous insufficiency, hypertension, congestive heart failure, and a history of breast cancer. Fortunately there does not appear to be evidence of active infection at this time which is great news. No fevers, chills, nausea, vomiting, or diarrhea. Wound is not extremely large but does have some slough covering the surface of the wound. This is going require sharp debridement  today. 03/15/2020 upon evaluation today patient appears to be doing fairly well in regard to her wound. She does have some slough noted on the surface of the wound currently but I do believe the Iodoflex has been beneficial over the past week. There is no signs of active infection at this time which is great news and overall very pleased with the progress. No fevers, chills, nausea, vomiting, or diarrhea. 04/02/2020 upon evaluation today patient appears to be doing a little better in regard to her wound size wise this is not tremendously smaller she does have some slough buildup on the surface of the wound. With that being said this is going require some sharp debridement today to clear away some of the necrotic debris. Fortunately there is no evidence of active infection at this time. No fevers, chills, nausea, vomiting, or diarrhea. 04/10/2020 on evaluation today patient appears to be doing well with regard to her ulcer which is measuring somewhat smaller today. She actually has more granulation tissue noted which is also good news is no need for sharp debridement today. Fortunately there is no evidence of infection either which is also excellent. No fevers, chills, nausea, vomiting, or diarrhea. [name], [name] ([id number]) [id number] Page 2 of 15 04/26/2020 on evaluation today patient's wound actually is showing signs of good improvement which is great news. There does not appear to be any evidence of active infection. I do believe she is tolerating the collagen at this point. 05/03/2020 upon evaluation today patient's wound actually showed signs of good granulation at this time there does not appear to be any evidence of active infection which is great news and overall very pleased with where things stand. With that being said I do believe that the patient is can require some sharp debridement today but fortunately nothing too significant. 05/11/20 on evaluation today  patient appears to be doing well in regard to her leg ulcer. She's been  tolerating the dressing changes without complication. Fortunately there is no signs of active infection at this time. Overall I'm very pleased with where things stand. 05/18/2020 upon evaluation today patient's wound is showing signs of improvement is very dry and I think the collagen for the most part just dried to the wound bed. I am going to clean this away with sharp debridement in order to get this under control in my opinion. 05/25/2020 upon evaluation today patient actually appears to be doing well in regard to her leg ulcer. In fact upon inspection it appears she could potentially be completely healed but that is not guaranteed based on what we are seeing. There does not appear to be any signs of active infection which is great news. 05/31/2020 on evaluation today patient appears to be doing well with regard to her wounds. She in fact appears to be almost completely healed on the left leg there is just a very small opening remaining point 1 06/08/2020 upon evaluation today patient appears to be doing excellent in regard to her leg ulcer on the left in fact it appears to be that she is completely healed as of today. I see no signs of active infection at this time which is great news. No fevers, chills, nausea, vomiting, or diarrhea. READMISSION 01/16/2021 Patient that we have had in this clinic with a wound on the left posterior calf in the summer 2021 extending into October. She was felt to have chronic venous insufficiency. Per the patient's description she was discharged with what sounds like external compression stockings although she could not afford the "$75 per leg". She therefore did not get anything. Apparently her wound that she has currently is been in existence since January. She has been followed at vein and vascular with Unna boots and I think calcium alginate. She had ABIs done on 06/25/2020 that showed an  noncompressible ABI on the right leg and the left leg but triphasic waveforms with good great toe pressures and a TBI of 0.90 on the right and 0.91 on the left Surprisingly the patient also had venous reflux studies that were really unremarkable. This included no evidence of DVT or SVT but there was no evidence of s s deep venous insufficiency or superficial venous insufficiency in the greater saphenous or short saphenous veins bilaterally. I would wonder about more central venous issues or perhaps this is all lymphedema 01/25/2021 upon evaluation today patient appears to be doing decently well and guard to her wound. The good news is the Iodoflex that is job she saw Dr. Dellia Nims last week for readmission and to be honest I think that this has done extremely well over that week. I do believe that she can tolerate the sharp debridement today which will be good. I think that cleaning off the wound will likely allow [place] to be able to use a different type of dressing I do not think the Iodoflex will even be necessary based on how clean the wound looks. Fortunately there is no sign of active infection at this time which is great news. No fevers, chills, nausea, vomiting, or diarrhea. 02/12/2021 upon evaluation today patient appears to be doing well with regard to her leg ulcer. We did switch to alginate when she came for nurse visit I think is doing much better for her. Fortunately there does not appear to be any signs of active infection at this time which is great news. No fevers, chills, nausea, vomiting, or diarrhea. 02/18/2021 upon evaluation today patient's  wound is actually showing signs of good improvement. I am very pleased with where things stand currently. I do not see any signs of active infection which is great news and overall I feel like she is making good progress. The patient likewise is happy that things are doing so well and that this is measuring somewhat smaller. 02/25/2021 upon evaluation  today patient actually appears to be doing quite well in regard to her wounds. She has been tolerating the dressing changes without complication. Fortunately there does not appear to be any signs of active infection which is great news and overall I am extremely pleased with where things stand. No fevers, chills, nausea, vomiting, or diarrhea. She does have a lot of edema I really do think she needs compression socks to be worn in order to prevent things from causing additional issues for her currently. Especially on the right leg she should be wearing compression socks all the time to be honest. 03/04/2021 upon evaluation today patient's leg ulcer actually appears to be doing pretty well which is great news. There does not appear to be any significant change but overall the appearance is better even though the size is not necessarily reflecting a great improvement. Fortunately there does not appear to be any signs of active infection. No fevers, chills, nausea, vomiting, or diarrhea. 03/12/2021 upon evaluation today patient appears to be doing well with regard to her wounds. She has been tolerating the dressing changes without complication. The good news is that the wound on her left lateral leg is doing much better and is showing signs of good improvement. Overall her swelling is controlled well as well. She does need some compression socks she plans to go Jefferey socks next week. 03/18/2021 on evaluation today patient's wound actually is showing signs of good improvement. She does have a little bit of slough this can require some sharp debridement today. 03/25/2021 upon evaluation today patient appears to be doing well with regard to her wound on the left lateral leg. She has been tolerating the dressing changes without complication. Fortunately there does not appear to be any signs of active infection at this time. No fever chills no 04/01/2021 upon evaluation today patient's wound is showing signs of  improvement and overall very pleased with where things stand at this point. There is no evidence of active infection which is great news as well and in general I think that she is making great progress. 04/09/2021 upon evaluation today patient appears to be doing well with regard to her ankle ulcer. She is making good progress currently which is great news. There does not appear to be any signs of infection also excellent news. In general I am extremely pleased with where things stand today. No fevers, chills, nausea, vomiting, or diarrhea. 04/23/2021 upon inspection today patient appears to be doing quite well with regard to her leg ulcer. She has been tolerating the dressing changes without complication. She is can require some sharp debridement today but overall seems to be doing excellent. 05/06/2021 upon evaluation today patient unfortunately appears to be doing poorly overall in regard to her swelling. She is actually significantly more swollen than last week despite the fact that her wound is doing better this is not a good trend. I think that she probably needs to see her cardiologist ASAP and I discussed that with her today. This includes the fact that honestly I think she probably is getting need an increase in her diuretic or something to try to  clear away some of the excess fluid that she is experiencing at the moment. She is not been doing her pumps even twice a day but again right now more concerned that if she were to do the pump she would fluid overload her lungs causing other issues as far as her breathing is concerned. Obviously I think she needs to contact them and move up the appointment for the 12th of something sooner 2 weeks is a bit too long to wait with how swollen she has. 05/14/2021 upon evaluation today patient's wound actually showing signs of being a little bit smaller compared to previous. She has been making good progress however. Fortunately there does not appear to be any  evidence of active infection which is great. Overall I am extremely pleased in that regard. Nonetheless I am still concerned that she is having quite a bit of issue as far as the swelling is concerned she has been placed on fluid restriction as well as an increase in the furosemide by her doctor. We will see how things progress. 05/21/2021 upon evaluation today patient appears to be doing well with regard to her wound in general. She has been tolerating the dressing changes without [name], [name] ([id number]) [id number] Page 3 of 15 complication. With that being said she has been using silver cell for a while and while this was showing signs of improvement it is now gotten to the point where this has slowed down quite significantly. I do believe that switching to a different dressing may be beneficial for her today. 05/28/2021 upon evaluation today patient actually appears to be doing quite well in regard to her wound. In fact there is really not any need for sharp debridement today based on what I see. The Hydrofera Blue is doing great and overall I think that she is managing quite nicely with the compression wrapping. Her edema is still down quite a bit with the wrapping in place. 06/11/2021 upon evaluation today patient appears to be doing well with regard to her leg ulcer. She has been tolerating the dressing changes without complication. Fortunately there does not appear to be any signs of active infection at this time which is great news. No fevers, chills, nausea, vomiting, or diarrhea. She is going require some sharp debridement today. 10/11; left leg ulcer much better looking and with improved surface area. She is using Hydrofera Blue under 3 layer compression. She does not have any stocking on the right leg which in itself has very significant nonpitting edema 06/25/2021 upon evaluation today patient appears to be doing well with regard to the wound on her left  leg. She has been tolerating the dressing changes without complication and overall I am extremely pleased with where things stand today. I do not see any evidence of infection which is great news. 07/02/2021 upon evaluation today patient's wound actually showing signs of excellent improvement and actually very pleased with where we stand today and the overall appearance. Fortunately there does not appear to be any signs of active infection. No fevers, chills, nausea, vomiting, or diarrhea. 07/09/2021 upon evaluation today patient appears to be doing better in regard to her wound this is measuring smaller and she is headed in the appropriate direction based on what I am seeing currently. I do not see any evidence of active infection systemically which is great news. 07/16/2021 upon evaluation today patient appears to be doing pretty well currently in regard to her leg ulcer. This is measuring smaller and looking much  better this is great news. Fortunately there does not appear to be any evidence of active infection systemically which is great news as well. 07/23/2021 upon evaluation today patient's wound is actually showing signs of good improvement this is definitely measuring smaller. I do not see any signs of infection which is great news and overall very pleased in that regard. Overall I think that she is doing well with the [facility]. 07/30/2020 upon evaluation today patient appears to be doing well in regard to her wound. She has been tolerating the dressing changes without complication think the Hydrofera Blue at this point is actually causing her to dry out a lot as far as the wound bed is concerned. Subsequently I think that we need to try to see what we can do to improve this. Fortunately there does not appear to be any evidence of active infection at this time which is great news. 08/06/2021 upon evaluation today patient appears to be doing well with regards to her wound. Is not measuring  significantly smaller initial inspection but upon closer inspection she has a lot of new skin growth across the central portion of the wound. I think will get very close to complete resolution. 08/13/2021 upon evaluation today patient appears to be doing well with regard to her wound. In fact this appears to be I believe healed although there is still some question as to whether it is completely so. I am concerned about the fact that to be honest she still has some evidence of dry skin that could be just trapping something underneath I still want to debride anything as I am afraid of causing some damage to the good skin for that reason I Georgina Peer probably monitor for 1 more week before completely closing everything out. 08/20/2021 upon evaluation today patient actually appears to be doing excellent. In fact she appears to be completely healed. This was the case last week as well we just wanted to make sure nothing reopened since she does not really have an ability to put on compression socks this is good to be something we need to make sure was well-healed. Nonetheless I think we have achieved that goal as of today. 12/27; this is a patient we discharged 2 weeks ago with wounds on her left lower leg laterally. She was supposed to get stockings and really did not get them. She developed blistering and reopening probably sometime late last week. She has 3 areas in the same area as previously. The mid calf dimensions on the left went from 39 to 51 cm today. She also has very significant edema on the right leg but as far as I am aware has not had wounds in this area 1/4; patient presents for follow-up. She has tolerated the compression wrap well. She has no issues or complaints today. 09/17/2021 upon evaluation today patient appears to be doing well with regard to her wounds. She has been tolerating the dressing changes without complication. Fortunately there does not appear to be any signs of active infection  at this time. No fevers, chills, nausea, vomiting, or diarrhea. 10/01/2021 upon evaluation today patient appears to be doing well with regard to her wound this is actually measuring better and looking smaller and I am very pleased in that regard. Fortunately I do not see any evidence of active infection locally nor systemically at this point. No fevers, chills, nausea, vomiting, or diarrhea. 10/08/2021 upon evaluation patient's wounds are actually showing some signs of improvement measuring a little bit  smaller and looking a little bit better. Overall I do not think there is any need for sharp debridement today which is good news. No fevers, chills, nausea, vomiting, or diarrhea. 10/14/2021 upon evaluation today patient appears to be doing well with regard to her wound. She has been tolerating the dressing changes without complication and overall I am extremely pleased with where things stand today. There is a little bit of film on the surface of the wounds but this was carefully cleaned away with just saline and gauze she really did not want me to do any sharp debridement today. 10/29/2021 upon evaluation today patient appears to be doing quite well in regard to her wounds. She is actually showing signs of excellent improvement which is great news and overall I feel like we are on the right track at this time. She does have a wound on both legs and she does need something compression wise when she heals for that reason we will get a go and see about ordering her bilateral juxta lite compression wraps. 11/05/2021 upon evaluation today patient appears to be doing well with regard to her wound. She has been tolerating the dressing changes without complication. Fortunately I do not see any evidence of active infection locally nor systemically at this time which is great news. No fevers, chills, nausea, vomiting, or diarrhea. 11/12/2021 upon evaluation today patient appears to be doing well with regard to her  wounds. She is doing excellent on the left on the right this is not doing quite as well there is some slough and biofilm buildup. 3/14; patient presents for follow-up. She has no issues or complaints today. She has tolerated the compression wraps well. 11/26/2021 upon evaluation today patient appears to be doing well with regard to her wounds. Both are showing signs of good improvement which is great news. I do not see any evidence of active infection and overall think she is healing quite nicely. 12/03/2021 upon evaluation today patient appears to be doing well with regard to her wounds. Both are showing signs of excellent improvement I am very pleased with where things stand and I think that she is making good progress here. Fortunately I do not see any signs of active infection locally or systemically which is great news. No fevers, chills, nausea, vomiting, or diarrhea. 12-10-2021 upon evaluation today patient's wounds actually appear to be doing awesome. I am extremely pleased with where we stand I think she is making excellent progress. I do not see any signs of active infection locally or systemically at this time. 12-17-2021 upon evaluation today patient's wounds are actually showing signs of good improvement. Fortunately I do not see any evidence of active infection locally nor systemically which is great news. Overall I do not believe that she is showing any signs of significant worsening which is good news. With that being said I do believe that she has a little bit of film on the surface of the wound I was actually able to clear this away with saline and gauze no sharp debridement was necessary. [name], [name] ([id number]) [id number] Page 4 of 15 12-24-2021 upon evaluation today patient appears to be doing well with regard to her wounds. I do not see any signs of active infection currently which is great news and overall I think that we are on the right track  here. No fevers, chills, nausea, vomiting, or diarrhea. 12-31-2021 upon evaluation today patient appears to be doing well with regard to her wounds. Both are showing  signs of improvement the wound on the right leg is going require some sharp debridement on the left seems to be doing quite well. 01-07-2022 upon evaluation today patient appears to be doing well currently in regard to her wounds. She is actually making good progress bilaterally. Both of them require little bit of sharp debridement but not too much which is great news. Upon inspection patient's wound bed actually showed signs of good granulation and epithelization on both legs. She still is having significant issues with the right leg compared to the left although I do feel like both are actually showing signs of pretty good improvement. 01-21-2022 upon evaluation today patient's wound is actually showing signs of excellent improvement I am very pleased with where we stand currently. I do not see any signs of active infection locally or systemically which is great news. No fevers, chills, nausea, vomiting, or diarrhea. 01-28-2022 upon evaluation today patient appears to be doing well with regard to her wound on the left leg this is pretty much just about healed which is great news. Unfortunately in regard to the wound on her right leg this is not doing nearly as well in fact I think we probably need to see about a culture today that was discussed with her. 02-04-2022 upon evaluation today patient appears to be doing well with regard to her left leg which in fact might actually be completely healed I am not 100% sure. Nonetheless this is shown signs of excellent improvement which is great news. With that being said in regards to the right leg there is some slough and film buildup on the surface of the wound although she is feeling much better than last week this does appear to be doing much better than it was last week. Fortunately there does  not appear to be any signs of active infection locally or systemically at this time. 02-11-2022 upon evaluation today patient's wounds appear to be doing decently well. In fact the left leg is healed although there is brand-new skin were definitely given still need to wrap her for the time being. On the right leg this is showing signs of some epithelial growth in the middle of the wound it still measures the same because there is still a larger area with speckled openings but again as far as the entire area being open this is significantly improved which is great news. I am very pleased. 6/13; left leg remains healed and she has a juxta light to apply to it today. There is some concern about whether she is going to be able to do this at home. She lives with a daughter although she has her own disability. On the right the wound looks smaller to me nice epithelialization good edema control. 02-25-2022 upon evaluation today patient appears to be doing well with regard to her legs the left leg is still healed the right leg is doing much better. I think we are on the right track here. With that being said unfortunately she continues to have significant issues with some other problems and she actually tells me on Saturday she was having lunch and went inside to sit down when she tells me she had all of a sudden a sharp sensation that went from her hand through her arm into her leg and her neck and she tells me that she could not see anything for the next hour it was completely black. Slowly after this her vision started to return and has been normal since. It  sounds to me as if she may have had a TIA or mini stroke at this point. Nonetheless I think that she needs to have this checked out ASAP. Her primary care provider is Dr. Clide Deutscher at [facility]. We will get a try to get in touch with him. 03-10-2022 upon evaluation today patient's wound actually showed signs of good improvement. She seems to be making  excellent progress here. I do not see any evidence of active infection locally or systemically which is great news. No fevers, chills, nausea, vomiting, or diarrhea. She was in the hospital since have seen her last she ended up finding out that she was having syncopal episodes. That is what was going on with the blacking out and losing vision. Subsequently they adjusted some of her medication she seems to be doing better. 03-18-2022 upon evaluation today patient's wound is actually showing signs of good granulation and epithelization at this point. Fortunately I do not see any signs of active infection locally or systemically which is great news. 03-25-2022 any evidence of active infection locally or systemically which is great news. Any evidence of active infection which is great news and overall the wound is looking better. Upon evaluation today patient appears to be doing well with regard to her leg ulcer. She has been tolerating the dressing changes without complication. Fortunately I do not see 7/25; the patient's wound is on the right lateral lower extremity using silver alginate 3 layer compression. She has chronic venous insufficiency and lymphedema. We are using a juxta lite on the left leg which does not have any open wounds but she is leaving this on all week it seems that there just is not a way to get this changed at home 04-08-2022 upon evaluation today patient appears to be doing well currently in regard to her wound. She has been tolerating the dressing changes without complication. Fortunately I do not see any evidence of active infection locally or systemically which is great news. No fever or chills noted her left leg has reopened at this point. 04-15-2022 upon evaluation patient's wounds actually are showing signs of improvement which is great news. Little by little I do believe her making progress here. In regard to the right leg this is looking better though the measurement does not  speak to it there is a lot of new skin growth. On the left leg this is significantly smaller compared to last week most of the blistered area has completely closed. 04-22-2022 upon evaluation today patient appears to be doing excellent in regard to her wounds both are showing signs of improvement. Her knees and above are still extremely swollen the legs were rewrapped them are down significantly. With that being said I do not think that we will get a be able to keep them that way once were not rapid noted that she really has no way to be able to put juxta lites on and off and nobody to help her with that. That is the biggest concern that we have here at this point. Fortunately I do not think that there is any evidence of infection which is great news. 04-29-2022 upon evaluation today patient appears to be doing well with regard to her wounds. She has been tolerating the dressing changes without complication. Fortunately there does not appear to be any evidence of infection locally or systemically which is great news and overall I am extremely pleased in that regard. Left leg is healed the right leg is very close we  discussed today the possibility of her granddaughter helping with putting on and off compression wraps for her. 05-06-2022 upon evaluation today patient appears to be doing well currently in regard to her left leg which is showing signs of being completely healed. With regard to her right leg she still has an open area though this is doing much better there is a lot less drainage than what we previously noted. Fortunately I see no evidence of active infection locally or systemically at this time which is great news. 05-20-2022 upon evaluation today patient appears to be doing excellent in regard to her leg. The left leg is still healed the right leg is much better. Fortunately I see no signs of infection locally or systemically at this time which is great news. No fevers, chills, nausea,  vomiting, or diarrhea. 05-27-2022 upon evaluation today patient appears to be doing excellent in regard to her wound this is actually measuring smaller and looking much better. Fortunately I see no signs of active infection locally or systemically at this time. 06-03-2022 upon evaluation patient's wound actually is measuring smaller and looking much better. She still has a lot of discomfort but fortunately nothing to significant at this point which is great news. No fevers, chills, nausea, vomiting, or diarrhea. I think we are making some pretty good progress here. 06-10-2022 upon evaluation today patient appears to be doing well currently in regard to her wound which is actually measuring somewhat smaller. This is actually 2 separate wounds that are clustered and this is making it appear little bit larger than what it is but nonetheless we are seeing some definite improvement. Fortunately I do not see any signs of active infection at this time. No fevers, chills, nausea, vomiting, or diarrhea. 06-17-2022 upon evaluation today patient appears to be doing well currently in regard to her wound although she was in the hospital since she was last here. [name], [name] ([id number]) [id number] Page 5 of 15 That was from 06-12-2022 through 06-16-2022 which was yesterday. This was due to a CHF exacerbation. With that being said the patient has noted in her record review acute on chronic congestive heart failure with hypertensive emergency noted. She also has stage IIIa chronic kidney disease noted. With that being said I do think her wound is much better because I got a lot of fluid off to try to diurese her to a degree but her kidneys would not hold for that they put her on furosemide 40 mg and that is helping to get the fluid off she is much better but still has a lot of fluid buildup. 06-24-2022 upon evaluation today patient appears to be doing well currently in regard to her wound  is not significantly smaller but it is improved overall. Fortunately I do not see any signs of active infection locally nor systemically at this time. 07-01-2022 upon evaluation today patient appears to be doing okay in regard to her wound I do not believe the silver alginate is helping quite as much as I would like to see. I would actually recommend we switch back over to the [facility] at this point which I think may do better. Fortunately I do not see any signs of infection which is good news were going to switch to Tubigrip on the left leg on the right leg we will get a continue with the compression wrap. 07-08-2022 upon evaluation today patient unfortunately is not doing nearly as well in regard to her left leg. We had to put  her back into a 4-layer compression wrap today due to a significant blister that appeared. I do believe that we need to try to get this to reabsorb we will see how things do over the next week. With that being said the right leg is doing well the 4-layer compression wrap and switch to the [facility] seems to be very beneficial I am pleased with what we are seeing in that regard. 11/7; patient's wounds are on the right posterior lateral leg actually measures smaller and look healthy we have been using Hydrofera Blue under 3 layer compression. Last week she had a large blister that was left intact covered with alginate but the leg was wrapped in 3 layer. Today the left leg had unroofed but the skin still looks reasonably healthy. Nevertheless she is still going to require the left leg to be wrapped 07-22-2022 upon evaluation today patient unfortunately is extremely swollen in regard to her bilateral lower extremities despite the fact that she is currently being compression wrap a 4-layer compression wrap bilaterally. This is unfortunate and the fact that I believe this is not just a lower extremity swelling issue but to be honest this is a much more global issue with  edema in regard to her congestive heart failure. Her BNP has been up in the past month and I am not sure where it is as it has been rechecked she sees the heart failure clinic on Thursday in 2 days. With that being said she does tell me that she is having trouble laying down she tells me that she is having trouble walking as well. With all that being said I advised that she probably needs to go to the hospital for further evaluation. 07-29-2022 upon evaluation today patient appears to be doing well currently in regard to her wound on the right leg I am very pleased the left leg is also doing better though it is weeping quite a bit. I think we need to do something to Help calm this down a bit. She is in agreement with that plan. 08-05-2022 upon evaluation today patient's wounds are showing signs of improvement albeit slowly. Fortunately I do not see any evidence of infection locally nor systemically at this time which is great news. No fevers, chills, nausea, vomiting, or diarrhea. 08-12-2022 upon evaluation today patient actually appears to be doing well currently in regard to her wounds which is showing signs of improvement. Fortunately I do not see any signs of active infection locally nor systemically at this time which is great news. No fevers, chills, nausea, vomiting, or diarrhea. 08-19-2022 upon evaluation today patient appears to be doing decently well in regard to her wounds. She has been tolerating the dressing changes without complication. Fortunately there does not appear to be any signs of active infection locally nor systemically at this time which is great news. No fevers, chills, nausea, vomiting, or diarrhea. 08-26-2022 upon evaluation today patient appears to be doing decently well in regard to the wounds on her lower extremities. Both are showing signs of improvement which is great news. I do think that she may benefit from switching over to an Unna boot wrap instead of the 3 layer  compression wrap just due to the fact that her wraps do seem to be trying to slide a lot more on her unfortunately. The patient is in agreement with that plan. Fortunately I do not see any signs of active infection at this time which is great news and overall I  am extremely pleased with where we stand currently. Readmission: 11-25-2022 upon evaluation today patient appears to be doing well currently in regard to her legs in fact she is here for reevaluation although I have not seen her since December she is actually been in the skilled nursing facility [facility] at [facility]. With that being said her legs actually appear to be doing much better in fact the wounds are completely healed I am extremely pleased with where things stand and that workup Electronic Signature(s) Signed: 11/25/2022 5:18:18 PM By: Worthy Keeler PA-C Entered By: Worthy Keeler on 11/25/2022 17:18:18 -------------------------------------------------------------------------------- Physical Exam Details Patient Name: Date of Service: [name] 11/25/2022 2:45 PM Medical Record Number: [id number] Patient Account Number: [id number] Date of Birth/Sex: Treating RN: 08-23-1946 (77 y.o. [name] Primary Care Provider: [name] Other Clinician: Referring Provider: Treating Provider/Extender: [name] Self, Referral Weeks in Treatment: 0 Constitutional patient is hypertensive.. pulse regular and within target range for patient.Marland Kitchen respirations regular, non-labored and within target range for patient.Marland Kitchen temperature within target range for patient.. Obese and well-hydrated in no acute distress. [address] [name], [name] ([id number]) [id number] Page 6 of 15 conjunctiva clear no eyelid edema noted. pupils equal round and reactive to light and accommodation. Ears, Nose, Mouth, and Throat no gross abnormality of ear auricles or external auditory canals. normal hearing noted during  conversation. mucus membranes moist. Respiratory normal breathing without difficulty. Cardiovascular 2+ dorsalis pedis/posterior tibialis pulses. 1+ pitting edema of the bilateral lower extremities. Musculoskeletal Patient unable to walk without assistance. Psychiatric this patient is able to make decisions and demonstrates good insight into disease process. Alert and Oriented x 3. pleasant and cooperative. Notes Upon inspection patient's wound bed actually showed signs of good granulation and epithelization at this point. Fortunately in fact I do not see any signs of open wounds and this is great news despite the fact that she has been dealing with open wounds for so long it does appear that being in the skilled nursing facility that there are a lot of downsides on the outside has really helped her legs tremendously. Electronic Signature(s) Signed: 11/25/2022 5:18:51 PM By: Worthy Keeler PA-C Entered By: Worthy Keeler on 11/25/2022 17:18:51 -------------------------------------------------------------------------------- Physician Orders Details Patient Name: Date of Service: [name] 11/25/2022 2:45 PM Medical Record Number: [id number] Patient Account Number: [id number] Date of Birth/Sex: Treating RN: 05-10-1946 (77 y.o. 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 Put garments on first thing when you wake up and remove them before bed. - Staff at facility to help patient put the  socks on and take them off daily. Moisturize legs daily after removing compression garments. Elevate, Exercise Daily and A void Standing for Long Periods of Time. Follow-up Appointments Other: - Call if needed Electronic Signature(s) Signed: 11/25/2022 6:19:02 PM By: Worthy Keeler PA-C Signed: 11/26/2022 8:15:46 AM By: Rosalio Loud MSN RN CNS WTA Entered By: Rosalio Loud on 11/25/2022 15:31:58 [name] ([id number]) [id number] Page 7 of 15 -------------------------------------------------------------------------------- Problem List Details Patient Name: Date of Service: [name] 11/25/2022 2:45 PM Medical Record Number: [id number] Patient  Account Number: [id number] Date of Birth/Sex: Treating RN: 20-Feb-1946 (77 y.o. [name] Primary Care Provider: [name] Other Clinician: Referring Provider: Treating Provider/Extender: [name] Self, Referral Weeks in Treatment: 0 Active Problems ICD-10 Encounter Code Description Active Date MDM Diagnosis I89.0 Lymphedema, not elsewhere classified 11/25/2022 No Yes I87.323 Chronic venous hypertension (idiopathic) with inflammation of bilateral lower 11/25/2022 No Yes extremity I50.42 Chronic combined systolic (congestive) and diastolic (congestive) heart failure 11/25/2022 No Yes Inactive Problems Resolved Problems Electronic Signature(s) Signed: 11/25/2022 3:12:28 PM By: Worthy Keeler PA-C Previous Signature: 11/25/2022 3:12:07 PM Version By: Worthy Keeler PA-C Entered By: Worthy Keeler on 11/25/2022 15:12:28 -------------------------------------------------------------------------------- Progress Note Details Patient Name: Date of Service: [name] 11/25/2022 2:45 PM Medical Record Number: [id number] Patient Account Number: [id number] Date of Birth/Sex: Treating RN: [birth year] (77 y.o. [name] Primary Care Provider: [name] Other Clinician: Referring  Provider: Treating Provider/Extender: [name] Self, Referral Weeks in Treatment: [address] [place], [name] ([id number]) [id number] Page 8 of 15 Chief Complaint Information obtained from Patient Bilateral Lymphedema no Wounds History of Present Illness (HPI) 03/15/2020 upon evaluation today patient presents today for initial evaluation here in our clinic concerning a wound that is on the left posterior lower extremity. Unfortunately this has been giving the patient some discomfort at this point which she notes has been affected her pretty much daily. The patient does have a history of venous insufficiency, hypertension, congestive heart failure, and a history of breast cancer. Fortunately there does not appear to be evidence of active infection at this time which is great news. No fevers, chills, nausea, vomiting, or diarrhea. Wound is not extremely large but does have some slough covering the surface of the wound. This is going require sharp debridement today. 03/15/2020 upon evaluation today patient appears to be doing fairly well in regard to her wound. She does have some slough noted on the surface of the wound currently but I do believe the Iodoflex has been beneficial over the past week. There is no signs of active infection at this time which is great news and overall very pleased with the progress. No fevers, chills, nausea, vomiting, or diarrhea. 04/02/2020 upon evaluation today patient appears to be doing a little better in regard to her wound size wise this is not tremendously smaller she does have some slough buildup on the surface of the wound. With that being said this is going require some sharp debridement today to clear away some of the necrotic debris. Fortunately there is no evidence of active infection at this time. No fevers, chills, nausea, vomiting, or diarrhea. 04/10/2020 on evaluation today patient appears to be doing well with regard to her  ulcer which is measuring somewhat smaller today. She actually has more granulation tissue noted which is also good news is no need for sharp debridement today. Fortunately there is no evidence of infection either which is also excellent. No fevers, chills, nausea, vomiting, or diarrhea. 04/26/2020 on evaluation today patient's wound actually is showing signs of good improvement which is great news. There does not appear to be any evidence of active infection. I do believe she is tolerating the collagen at this point. 05/03/2020 upon evaluation today patient's wound actually showed signs of good granulation at this time there does not appear to be any evidence of active infection which is great news and overall very pleased with where things stand. With that being said I do believe that the patient is can require  some sharp debridement today but fortunately nothing too significant. 05/11/20 on evaluation today patient appears to be doing well in regard to her leg ulcer. She's been tolerating the dressing changes without complication. Fortunately there is no signs of active infection at this time. Overall I'm very pleased with where things stand. 05/18/2020 upon evaluation today patient's wound is showing signs of improvement is very dry and I think the collagen for the most part just dried to the wound bed. I am going to clean this away with sharp debridement in order to get this under control in my opinion. 05/25/2020 upon evaluation today patient actually appears to be doing well in regard to her leg ulcer. In fact upon inspection it appears she could potentially be completely healed but that is not guaranteed based on what we are seeing. There does not appear to be any signs of active infection which is great news. 05/31/2020 on evaluation today patient appears to be doing well with regard to her wounds. She in fact appears to be almost completely healed on the left leg there is just a very small opening  remaining point 1 06/08/2020 upon evaluation today patient appears to be doing excellent in regard to her leg ulcer on the left in fact it appears to be that she is completely healed as of today. I see no signs of active infection at this time which is great news. No fevers, chills, nausea, vomiting, or diarrhea. READMISSION 01/16/2021 Patient that we have had in this clinic with a wound on the left posterior calf in the summer 2021 extending into October. She was felt to have chronic venous insufficiency. Per the patient's description she was discharged with what sounds like external compression stockings although she could not afford the "$75 per leg". She therefore did not get anything. Apparently her wound that she has currently is been in existence since January. She has been followed at [facility] with Unna boots and I think calcium alginate. She had ABIs done on 06/25/2020 that showed an noncompressible ABI on the right leg and the left leg but triphasic waveforms with good great toe pressures and a TBI of 0.90 on the right and 0.91 on the left Surprisingly the patient also had venous reflux studies that were really unremarkable. This included no evidence of DVT or SVT but there was no evidence of s s deep venous insufficiency or superficial venous insufficiency in the greater saphenous or short saphenous veins bilaterally. I would wonder about more central venous issues or perhaps this is all lymphedema 01/25/2021 upon evaluation today patient appears to be doing decently well and guard to her wound. The good news is the Iodoflex that is job she saw Dr. Dellia Nims last week for readmission and to be honest I think that this has done extremely well over that week. I do believe that she can tolerate the sharp debridement today which will be good. I think that cleaning off the wound will likely allow [place] to be able to use a different type of dressing I do not think the Iodoflex will even be  necessary based on how clean the wound looks. Fortunately there is no sign of active infection at this time which is great news. No fevers, chills, nausea, vomiting, or diarrhea. 02/12/2021 upon evaluation today patient appears to be doing well with regard to her leg ulcer. We did switch to alginate when she came for nurse visit I think is doing much better for her. Fortunately there does  not appear to be any signs of active infection at this time which is great news. No fevers, chills, nausea, vomiting, or diarrhea. 02/18/2021 upon evaluation today patient's wound is actually showing signs of good improvement. I am very pleased with where things stand currently. I do not see any signs of active infection which is great news and overall I feel like she is making good progress. The patient likewise is happy that things are doing so well and that this is measuring somewhat smaller. 02/25/2021 upon evaluation today patient actually appears to be doing quite well in regard to her wounds. She has been tolerating the dressing changes without complication. Fortunately there does not appear to be any signs of active infection which is great news and overall I am extremely pleased with where things stand. No fevers, chills, nausea, vomiting, or diarrhea. She does have a lot of edema I really do think she needs compression socks to be worn in order to prevent things from causing additional issues for her currently. Especially on the right leg she should be wearing compression socks all the time to be honest. 03/04/2021 upon evaluation today patient's leg ulcer actually appears to be doing pretty well which is great news. There does not appear to be any significant change but overall the appearance is better even though the size is not necessarily reflecting a great improvement. Fortunately there does not appear to be any signs of active infection. No fevers, chills, nausea, vomiting, or diarrhea. 03/12/2021 upon  evaluation today patient appears to be doing well with regard to her wounds. She has been tolerating the dressing changes without complication. The good news is that the wound on her left lateral leg is doing much better and is showing signs of good improvement. Overall her swelling is controlled well as well. She does need some compression socks she plans to go Jefferey socks next week. 03/18/2021 on evaluation today patient's wound actually is showing signs of good improvement. She does have a little bit of slough this can require some sharp debridement today. 03/25/2021 upon evaluation today patient appears to be doing well with regard to her wound on the left lateral leg. She has been tolerating the dressing changes without complication. Fortunately there does not appear to be any signs of active infection at this time. No fever chills no [name], [name] ([id number]) [id number] Page 9 of 15 04/01/2021 upon evaluation today patient's wound is showing signs of improvement and overall very pleased with where things stand at this point. There is no evidence of active infection which is great news as well and in general I think that she is making great progress. 04/09/2021 upon evaluation today patient appears to be doing well with regard to her ankle ulcer. She is making good progress currently which is great news. There does not appear to be any signs of infection also excellent news. In general I am extremely pleased with where things stand today. No fevers, chills, nausea, vomiting, or diarrhea. 04/23/2021 upon inspection today patient appears to be doing quite well with regard to her leg ulcer. She has been tolerating the dressing changes without complication. She is can require some sharp debridement today but overall seems to be doing excellent. 05/06/2021 upon evaluation today patient unfortunately appears to be doing poorly overall in regard to her swelling. She is  actually significantly more swollen than last week despite the fact that her wound is doing better this is not a good trend. I think that she probably  needs to see her cardiologist ASAP and I discussed that with her today. This includes the fact that honestly I think she probably is getting need an increase in her diuretic or something to try to clear away some of the excess fluid that she is experiencing at the moment. She is not been doing her pumps even twice a day but again right now more concerned that if she were to do the pump she would fluid overload her lungs causing other issues as far as her breathing is concerned. Obviously I think she needs to contact them and move up the appointment for the 12th of something sooner 2 weeks is a bit too long to wait with how swollen she has. 05/14/2021 upon evaluation today patient's wound actually showing signs of being a little bit smaller compared to previous. She has been making good progress however. Fortunately there does not appear to be any evidence of active infection which is great. Overall I am extremely pleased in that regard. Nonetheless I am still concerned that she is having quite a bit of issue as far as the swelling is concerned she has been placed on fluid restriction as well as an increase in the furosemide by her doctor. We will see how things progress. 05/21/2021 upon evaluation today patient appears to be doing well with regard to her wound in general. She has been tolerating the dressing changes without complication. With that being said she has been using silver cell for a while and while this was showing signs of improvement it is now gotten to the point where this has slowed down quite significantly. I do believe that switching to a different dressing may be beneficial for her today. 05/28/2021 upon evaluation today patient actually appears to be doing quite well in regard to her wound. In fact there is really not any need for  sharp debridement today based on what I see. The Hydrofera Blue is doing great and overall I think that she is managing quite nicely with the compression wrapping. Her edema is still down quite a bit with the wrapping in place. 06/11/2021 upon evaluation today patient appears to be doing well with regard to her leg ulcer. She has been tolerating the dressing changes without complication. Fortunately there does not appear to be any signs of active infection at this time which is great news. No fevers, chills, nausea, vomiting, or diarrhea. She is going require some sharp debridement today. 10/11; left leg ulcer much better looking and with improved surface area. She is using Hydrofera Blue under 3 layer compression. She does not have any stocking on the right leg which in itself has very significant nonpitting edema 06/25/2021 upon evaluation today patient appears to be doing well with regard to the wound on her left leg. She has been tolerating the dressing changes without complication and overall I am extremely pleased with where things stand today. I do not see any evidence of infection which is great news. 07/02/2021 upon evaluation today patient's wound actually showing signs of excellent improvement and actually very pleased with where we stand today and the overall appearance. Fortunately there does not appear to be any signs of active infection. No fevers, chills, nausea, vomiting, or diarrhea. 07/09/2021 upon evaluation today patient appears to be doing better in regard to her wound this is measuring smaller and she is headed in the appropriate direction based on what I am seeing currently. I do not see any evidence of active infection systemically which is  great news. 07/16/2021 upon evaluation today patient appears to be doing pretty well currently in regard to her leg ulcer. This is measuring smaller and looking much better this is great news. Fortunately there does not appear to be any  evidence of active infection systemically which is great news as well. 07/23/2021 upon evaluation today patient's wound is actually showing signs of good improvement this is definitely measuring smaller. I do not see any signs of infection which is great news and overall very pleased in that regard. Overall I think that she is doing well with the [facility]. 07/30/2020 upon evaluation today patient appears to be doing well in regard to her wound. She has been tolerating the dressing changes without complication think the Hydrofera Blue at this point is actually causing her to dry out a lot as far as the wound bed is concerned. Subsequently I think that we need to try to see what we can do to improve this. Fortunately there does not appear to be any evidence of active infection at this time which is great news. 08/06/2021 upon evaluation today patient appears to be doing well with regards to her wound. Is not measuring significantly smaller initial inspection but upon closer inspection she has a lot of new skin growth across the central portion of the wound. I think will get very close to complete resolution. 08/13/2021 upon evaluation today patient appears to be doing well with regard to her wound. In fact this appears to be I believe healed although there is still some question as to whether it is completely so. I am concerned about the fact that to be honest she still has some evidence of dry skin that could be just trapping something underneath I still want to debride anything as I am afraid of causing some damage to the good skin for that reason I Georgina Peer probably monitor for 1 more week before completely closing everything out. 08/20/2021 upon evaluation today patient actually appears to be doing excellent. In fact she appears to be completely healed. This was the case last week as well we just wanted to make sure nothing reopened since she does not really have an ability to put on compression  socks this is good to be something we need to make sure was well-healed. Nonetheless I think we have achieved that goal as of today. 12/27; this is a patient we discharged 2 weeks ago with wounds on her left lower leg laterally. She was supposed to get stockings and really did not get them. She developed blistering and reopening probably sometime late last week. She has 3 areas in the same area as previously. The mid calf dimensions on the left went from 39 to 51 cm today. She also has very significant edema on the right leg but as far as I am aware has not had wounds in this area 1/4; patient presents for follow-up. She has tolerated the compression wrap well. She has no issues or complaints today. 09/17/2021 upon evaluation today patient appears to be doing well with regard to her wounds. She has been tolerating the dressing changes without complication. Fortunately there does not appear to be any signs of active infection at this time. No fevers, chills, nausea, vomiting, or diarrhea. 10/01/2021 upon evaluation today patient appears to be doing well with regard to her wound this is actually measuring better and looking smaller and I am very pleased in that regard. Fortunately I do not see any evidence of active infection locally nor  systemically at this point. No fevers, chills, nausea, vomiting, or diarrhea. 10/08/2021 upon evaluation patient's wounds are actually showing some signs of improvement measuring a little bit smaller and looking a little bit better. Overall I do not think there is any need for sharp debridement today which is good news. No fevers, chills, nausea, vomiting, or diarrhea. 10/14/2021 upon evaluation today patient appears to be doing well with regard to her wound. She has been tolerating the dressing changes without complication and overall I am extremely pleased with where things stand today. There is a little bit of film on the surface of the wounds but this was carefully  cleaned away with just saline and gauze she really did not want me to do any sharp debridement today. 10/29/2021 upon evaluation today patient appears to be doing quite well in regard to her wounds. She is actually showing signs of excellent improvement which is great news and overall I feel like we are on the right track at this time. She does have a wound on both legs and she does need something compression wise when she heals for that reason we will get a go and see about ordering her bilateral juxta lite compression wraps. [name], [name] ([id number]) [id number] Page 10 of 15 11/05/2021 upon evaluation today patient appears to be doing well with regard to her wound. She has been tolerating the dressing changes without complication. Fortunately I do not see any evidence of active infection locally nor systemically at this time which is great news. No fevers, chills, nausea, vomiting, or diarrhea. 11/12/2021 upon evaluation today patient appears to be doing well with regard to her wounds. She is doing excellent on the left on the right this is not doing quite as well there is some slough and biofilm buildup. 3/14; patient presents for follow-up. She has no issues or complaints today. She has tolerated the compression wraps well. 11/26/2021 upon evaluation today patient appears to be doing well with regard to her wounds. Both are showing signs of good improvement which is great news. I do not see any evidence of active infection and overall think she is healing quite nicely. 12/03/2021 upon evaluation today patient appears to be doing well with regard to her wounds. Both are showing signs of excellent improvement I am very pleased with where things stand and I think that she is making good progress here. Fortunately I do not see any signs of active infection locally or systemically which is great news. No fevers, chills, nausea, vomiting, or diarrhea. 12-10-2021 upon  evaluation today patient's wounds actually appear to be doing awesome. I am extremely pleased with where we stand I think she is making excellent progress. I do not see any signs of active infection locally or systemically at this time. 12-17-2021 upon evaluation today patient's wounds are actually showing signs of good improvement. Fortunately I do not see any evidence of active infection locally nor systemically which is great news. Overall I do not believe that she is showing any signs of significant worsening which is good news. With that being said I do believe that she has a little bit of film on the surface of the wound I was actually able to clear this away with saline and gauze no sharp debridement was necessary. 12-24-2021 upon evaluation today patient appears to be doing well with regard to her wounds. I do not see any signs of active infection currently which is great news and overall I think that we are on the right  track here. No fevers, chills, nausea, vomiting, or diarrhea. 12-31-2021 upon evaluation today patient appears to be doing well with regard to her wounds. Both are showing signs of improvement the wound on the right leg is going require some sharp debridement on the left seems to be doing quite well. 01-07-2022 upon evaluation today patient appears to be doing well currently in regard to her wounds. She is actually making good progress bilaterally. Both of them require little bit of sharp debridement but not too much which is great news. Upon inspection patient's wound bed actually showed signs of good granulation and epithelization on both legs. She still is having significant issues with the right leg compared to the left although I do feel like both are actually showing signs of pretty good improvement. 01-21-2022 upon evaluation today patient's wound is actually showing signs of excellent improvement I am very pleased with where we stand currently. I do not see any signs of  active infection locally or systemically which is great news. No fevers, chills, nausea, vomiting, or diarrhea. 01-28-2022 upon evaluation today patient appears to be doing well with regard to her wound on the left leg this is pretty much just about healed which is great news. Unfortunately in regard to the wound on her right leg this is not doing nearly as well in fact I think we probably need to see about a culture today that was discussed with her. 02-04-2022 upon evaluation today patient appears to be doing well with regard to her left leg which in fact might actually be completely healed I am not 100% sure. Nonetheless this is shown signs of excellent improvement which is great news. With that being said in regards to the right leg there is some slough and film buildup on the surface of the wound although she is feeling much better than last week this does appear to be doing much better than it was last week. Fortunately there does not appear to be any signs of active infection locally or systemically at this time. 02-11-2022 upon evaluation today patient's wounds appear to be doing decently well. In fact the left leg is healed although there is brand-new skin were definitely given still need to wrap her for the time being. On the right leg this is showing signs of some epithelial growth in the middle of the wound it still measures the same because there is still a larger area with speckled openings but again as far as the entire area being open this is significantly improved which is great news. I am very pleased. 6/13; left leg remains healed and she has a juxta light to apply to it today. There is some concern about whether she is going to be able to do this at home. She lives with a daughter although she has her own disability. On the right the wound looks smaller to me nice epithelialization good edema control. 02-25-2022 upon evaluation today patient appears to be doing well with regard to her  legs the left leg is still healed the right leg is doing much better. I think we are on the right track here. With that being said unfortunately she continues to have significant issues with some other problems and she actually tells me on Saturday she was having lunch and went inside to sit down when she tells me she had all of a sudden a sharp sensation that went from her hand through her arm into her leg and her neck and she tells me  that she could not see anything for the next hour it was completely black. Slowly after this her vision started to return and has been normal since. It sounds to me as if she may have had a TIA or mini stroke at this point. Nonetheless I think that she needs to have this checked out ASAP. Her primary care provider is Dr. Clide Deutscher at [facility]. We will get a try to get in touch with him. 03-10-2022 upon evaluation today patient's wound actually showed signs of good improvement. She seems to be making excellent progress here. I do not see any evidence of active infection locally or systemically which is great news. No fevers, chills, nausea, vomiting, or diarrhea. She was in the hospital since have seen her last she ended up finding out that she was having syncopal episodes. That is what was going on with the blacking out and losing vision. Subsequently they adjusted some of her medication she seems to be doing better. 03-18-2022 upon evaluation today patient's wound is actually showing signs of good granulation and epithelization at this point. Fortunately I do not see any signs of active infection locally or systemically which is great news. 03-25-2022 any evidence of active infection locally or systemically which is great news. Any evidence of active infection which is great news and overall the wound is looking better. Upon evaluation today patient appears to be doing well with regard to her leg ulcer. She has been tolerating the dressing changes without complication.  Fortunately I do not see 7/25; the patient's wound is on the right lateral lower extremity using silver alginate 3 layer compression. She has chronic venous insufficiency and lymphedema. We are using a juxta lite on the left leg which does not have any open wounds but she is leaving this on all week it seems that there just is not a way to get this changed at home 04-08-2022 upon evaluation today patient appears to be doing well currently in regard to her wound. She has been tolerating the dressing changes without complication. Fortunately I do not see any evidence of active infection locally or systemically which is great news. No fever or chills noted her left leg has reopened at this point. 04-15-2022 upon evaluation patient's wounds actually are showing signs of improvement which is great news. Little by little I do believe her making progress here. In regard to the right leg this is looking better though the measurement does not speak to it there is a lot of new skin growth. On the left leg this is significantly smaller compared to last week most of the blistered area has completely closed. 04-22-2022 upon evaluation today patient appears to be doing excellent in regard to her wounds both are showing signs of improvement. Her knees and above are still extremely swollen the legs were rewrapped them are down significantly. With that being said I do not think that we will get a be able to keep them that way once were not rapid noted that she really has no way to be able to put juxta lites on and off and nobody to help her with that. That is the biggest concern that we have here at this point. Fortunately I do not think that there is any evidence of infection which is great news. [name], [name] ([id number]) [id number] Page 11 of 15 04-29-2022 upon evaluation today patient appears to be doing well with regard to her wounds. She has been tolerating the dressing changes without  complication. Fortunately there  does not appear to be any evidence of infection locally or systemically which is great news and overall I am extremely pleased in that regard. Left leg is healed the right leg is very close we discussed today the possibility of her granddaughter helping with putting on and off compression wraps for her. 05-06-2022 upon evaluation today patient appears to be doing well currently in regard to her left leg which is showing signs of being completely healed. With regard to her right leg she still has an open area though this is doing much better there is a lot less drainage than what we previously noted. Fortunately I see no evidence of active infection locally or systemically at this time which is great news. 05-20-2022 upon evaluation today patient appears to be doing excellent in regard to her leg. The left leg is still healed the right leg is much better. Fortunately I see no signs of infection locally or systemically at this time which is great news. No fevers, chills, nausea, vomiting, or diarrhea. 05-27-2022 upon evaluation today patient appears to be doing excellent in regard to her wound this is actually measuring smaller and looking much better. Fortunately I see no signs of active infection locally or systemically at this time. 06-03-2022 upon evaluation patient's wound actually is measuring smaller and looking much better. She still has a lot of discomfort but fortunately nothing to significant at this point which is great news. No fevers, chills, nausea, vomiting, or diarrhea. I think we are making some pretty good progress here. 06-10-2022 upon evaluation today patient appears to be doing well currently in regard to her wound which is actually measuring somewhat smaller. This is actually 2 separate wounds that are clustered and this is making it appear little bit larger than what it is but nonetheless we are seeing some definite improvement. Fortunately I do not see  any signs of active infection at this time. No fevers, chills, nausea, vomiting, or diarrhea. 06-17-2022 upon evaluation today patient appears to be doing well currently in regard to her wound although she was in the hospital since she was last here. That was from 06-12-2022 through 06-16-2022 which was yesterday. This was due to a CHF exacerbation. With that being said the patient has noted in her record review acute on chronic congestive heart failure with hypertensive emergency noted. She also has stage IIIa chronic kidney disease noted. With that being said I do think her wound is much better because I got a lot of fluid off to try to diurese her to a degree but her kidneys would not hold for that they put her on furosemide 40 mg and that is helping to get the fluid off she is much better but still has a lot of fluid buildup. 06-24-2022 upon evaluation today patient appears to be doing well currently in regard to her wound is not significantly smaller but it is improved overall. Fortunately I do not see any signs of active infection locally nor systemically at this time. 07-01-2022 upon evaluation today patient appears to be doing okay in regard to her wound I do not believe the silver alginate is helping quite as much as I would like to see. I would actually recommend we switch back over to the [facility] at this point which I think may do better. Fortunately I do not see any signs of infection which is good news were going to switch to Tubigrip on the left leg on the right leg we will get a  continue with the compression wrap. 07-08-2022 upon evaluation today patient unfortunately is not doing nearly as well in regard to her left leg. We had to put her back into a 4-layer compression wrap today due to a significant blister that appeared. I do believe that we need to try to get this to reabsorb we will see how things do over the next week. With that being said the right leg is doing well the  4-layer compression wrap and switch to the [facility] seems to be very beneficial I am pleased with what we are seeing in that regard. 11/7; patient's wounds are on the right posterior lateral leg actually measures smaller and look healthy we have been using Hydrofera Blue under 3 layer compression. Last week she had a large blister that was left intact covered with alginate but the leg was wrapped in 3 layer. Today the left leg had unroofed but the skin still looks reasonably healthy. Nevertheless she is still going to require the left leg to be wrapped 07-22-2022 upon evaluation today patient unfortunately is extremely swollen in regard to her bilateral lower extremities despite the fact that she is currently being compression wrap a 4-layer compression wrap bilaterally. This is unfortunate and the fact that I believe this is not just a lower extremity swelling issue but to be honest this is a much more global issue with edema in regard to her congestive heart failure. Her BNP has been up in the past month and I am not sure where it is as it has been rechecked she sees the heart failure clinic on Thursday in 2 days. With that being said she does tell me that she is having trouble laying down she tells me that she is having trouble walking as well. With all that being said I advised that she probably needs to go to the hospital for further evaluation. 07-29-2022 upon evaluation today patient appears to be doing well currently in regard to her wound on the right leg I am very pleased the left leg is also doing better though it is weeping quite a bit. I think we need to do something to Help calm this down a bit. She is in agreement with that plan. 08-05-2022 upon evaluation today patient's wounds are showing signs of improvement albeit slowly. Fortunately I do not see any evidence of infection locally nor systemically at this time which is great news. No fevers, chills, nausea, vomiting, or  diarrhea. 08-12-2022 upon evaluation today patient actually appears to be doing well currently in regard to her wounds which is showing signs of improvement. Fortunately I do not see any signs of active infection locally nor systemically at this time which is great news. No fevers, chills, nausea, vomiting, or diarrhea. 08-19-2022 upon evaluation today patient appears to be doing decently well in regard to her wounds. She has been tolerating the dressing changes without complication. Fortunately there does not appear to be any signs of active infection locally nor systemically at this time which is great news. No fevers, chills, nausea, vomiting, or diarrhea. 08-26-2022 upon evaluation today patient appears to be doing decently well in regard to the wounds on her lower extremities. Both are showing signs of improvement which is great news. I do think that she may benefit from switching over to an Unna boot wrap instead of the 3 layer compression wrap just due to the fact that her wraps do seem to be trying to slide a lot more on her unfortunately. The  patient is in agreement with that plan. Fortunately I do not see any signs of active infection at this time which is great news and overall I am extremely pleased with where we stand currently. Readmission: 11-25-2022 upon evaluation today patient appears to be doing well currently in regard to her legs in fact she is here for reevaluation although I have not seen her since December she is actually been in the skilled nursing facility over at [facility]. With that being said her legs actually appear to be doing much better in fact the wounds are completely healed I am extremely pleased with where things stand and that workup Patient History Information obtained from Patient. Allergies No Known Drug Allergies Family History Cancer - Siblings, No family history of Stroke. Social History Former smoker, Marital Status - Widowed, Alcohol Use - Never,  Drug Use - No History, Caffeine Use - Never. Medical History Eyes [name], [name] ([id number]) [id number] Page 12 of 15 Patient has history of Cataracts - left Denies history of Glaucoma, Optic Neuritis Ear/Nose/Mouth/Throat Denies history of Chronic sinus problems/congestion, Middle ear problems Hematologic/Lymphatic Patient has history of Lymphedema Denies history of Anemia, Hemophilia, Human Immunodeficiency Virus, Sickle Cell Disease Respiratory Patient has history of Sleep Apnea - CPAP Denies history of Aspiration, Asthma, Chronic Obstructive Pulmonary Disease (COPD), Pneumothorax, Tuberculosis Cardiovascular Patient has history of Congestive Heart Failure, Hypertension Denies history of Angina, Arrhythmia, Coronary Artery Disease, Deep Vein Thrombosis, Hypotension, Myocardial Infarction, Peripheral Arterial Disease, Peripheral Venous Disease, Phlebitis, Vasculitis Gastrointestinal Denies history of Cirrhosis , Colitis, Crohnoos, Hepatitis A, Hepatitis B, Hepatitis C Endocrine Denies history of Type I Diabetes, Type II Diabetes Genitourinary Denies history of End Stage Renal Disease Immunological Denies history of Lupus Erythematosus, Raynaudoos, Scleroderma Integumentary (Skin) Denies history of History of Burn, History of pressure wounds Musculoskeletal Patient has history of Osteoarthritis Denies history of Gout, Rheumatoid Arthritis, Osteomyelitis Neurologic Patient has history of Neuropathy - hands Denies history of Dementia, Quadriplegia, Paraplegia, Seizure Disorder Oncologic Patient has history of Received Chemotherapy - Chemo pills for right breast, Received Radiation Psychiatric Denies history of Anorexia/bulimia, Confinement Anxiety Objective Constitutional patient is hypertensive.. pulse regular and within target range for patient.Marland Kitchen respirations regular, non-labored and within target range for patient.Marland Kitchen temperature within  target range for patient.. Obese and well-hydrated in no acute distress. Vitals Time Taken: 2:52 PM, Temperature: 97.8 F, Pulse: 75 bpm, Respiratory Rate: 16 breaths/min, Blood Pressure: 150/76 mmHg. Eyes conjunctiva clear no eyelid edema noted. pupils equal round and reactive to light and accommodation. Ears, Nose, Mouth, and Throat no gross abnormality of ear auricles or external auditory canals. normal hearing noted during conversation. mucus membranes moist. Respiratory normal breathing without difficulty. Cardiovascular 2+ dorsalis pedis/posterior tibialis pulses. 1+ pitting edema of the bilateral lower extremities. Musculoskeletal Patient unable to walk without assistance. Psychiatric this patient is able to make decisions and demonstrates good insight into disease process. Alert and Oriented x 3. pleasant and cooperative. General Notes: Upon inspection patient's wound bed actually showed signs of good granulation and epithelization at this point. Fortunately in fact I do not see any signs of open wounds and this is great news despite the fact that she has been dealing with open wounds for so long it does appear that being in the skilled nursing facility that there are a lot of downsides on the outside has really helped her legs tremendously. 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 Put garments on first thing when you wake up and remove them before bed. - Staff at facility to help patient put the socks on and take them off daily. Moisturize legs daily after removing compression garments. Elevate, Exercise Daily and Avoid  Standing for Long Periods of Time. Follow-up Appointments: Other: - Call if needed 1. Based on what I am seeing I do not see anything that appears to be open at this point I discussed that with the patient and she is in understanding of that. 2. Based on what I do see that I think she is going require ongoing compression therapy I think compression socks could be beneficial for her she could purchase these from [employer] [facility] she is needs to have someone at the facility to put these on and get these off for her on a daily basis. We will see her back for follow-up visit as needed. Electronic Signature(s) Signed: 11/25/2022 5:19:32 PM By: Worthy Keeler PA-C Entered By: Worthy Keeler on 11/25/2022 17:19:32 -------------------------------------------------------------------------------- ROS/PFSH Details Patient Name: Date of Service: [name] 11/25/2022 2:45 PM Medical Record Number: [id number] Patient Account Number: [id number] Date of Birth/Sex: Treating RN: 25-Jun-1946 (77 y.o. [name] Primary Care Provider: [name] Other Clinician: Referring Provider: Treating Provider/Extender: [name] Self, Referral Weeks in Treatment: 0 Information Obtained From Patient Eyes Medical History: Positive for: Cataracts - left Negative for: Glaucoma; Optic Neuritis Ear/Nose/Mouth/Throat Medical History: Negative for: Chronic sinus problems/congestion; Middle ear problems Hematologic/Lymphatic Medical History: Positive for: Lymphedema Negative for: Anemia; Hemophilia; Human Immunodeficiency Virus; Sickle Cell Disease Respiratory Medical History: Positive for: Sleep Apnea - CPAP Negative for: Aspiration; Asthma; Chronic Obstructive Pulmonary Disease (COPD); Pneumothorax; Tuberculosis Cardiovascular TASHALA, KLOCKO ([id number]) [id number] Page 14 of 15 Medical History: Positive for: Congestive Heart Failure; Hypertension Negative for:  Angina; Arrhythmia; Coronary Artery Disease; Deep Vein Thrombosis; Hypotension; Myocardial Infarction; Peripheral Arterial Disease; Peripheral Venous Disease; Phlebitis; Vasculitis Gastrointestinal Medical History: Negative for: Cirrhosis ; Colitis; Crohns; Hepatitis A; Hepatitis B; Hepatitis C Endocrine Medical History: Negative for: Type I Diabetes; Type II Diabetes Genitourinary Medical History: Negative for: End Stage Renal Disease Immunological Medical History: Negative for: Lupus Erythematosus; Raynauds; Scleroderma Integumentary (Skin) Medical History: Negative for: History of Burn; History of pressure wounds Musculoskeletal Medical History: Positive for: Osteoarthritis Negative for: Gout; Rheumatoid Arthritis; Osteomyelitis Neurologic Medical History: Positive for: Neuropathy - hands Negative for: Dementia; Quadriplegia; Paraplegia; Seizure Disorder Oncologic Medical History: Positive for: Received Chemotherapy - Chemo pills for right breast; Received Radiation Psychiatric Medical History: Negative for: Anorexia/bulimia; Confinement Anxiety HBO Extended History Items Eyes: Cataracts Immunizations Pneumococcal Vaccine: Received Pneumococcal Vaccination: No Implantable Devices None Family and Social History Cancer: Yes - Siblings; Stroke: No; Former smoker; Marital Status - Widowed; Alcohol Use: Never; Drug Use: No History; Caffeine Use: Never Electronic Signature(s) Signed: 11/25/2022 6:19:02 PM By: Worthy Keeler PA-C Signed: 11/26/2022 8:15:46 AM By: Rosalio Loud MSN RN CNS WTA Entered By: Rosalio Loud on 11/25/2022 14:59:37 [name] ([id number]) [id number] Page 15 of 15 -------------------------------------------------------------------------------- SuperBill Details Patient Name: Date of Service: [name] 11/25/2022 Medical Record Number: [id number] Patient Account Number: [id number] Date of Birth/Sex:  Treating RN: [birth year] (77 y.o. [name] Primary Care Provider: [name] Other Clinician: Referring Provider: Treating Provider/Extender: [name] Self, Referral Weeks in Treatment: 0 Diagnosis Coding ICD-10 Codes Code Description  I89.0 Lymphedema, not elsewhere classified I87.323 Chronic venous hypertension (idiopathic) with inflammation of bilateral lower extremity I50.42 Chronic combined systolic (congestive) and diastolic (congestive) heart failure Facility Procedures : CPT4 Code: [id number] Description: 99213 - WOUND CARE VISIT-LEV 3 EST PT Modifier: Quantity: 1 Physician Procedures : CPT4 Code Description Modifier [id number] 99213 - WC PHYS LEVEL 3 - EST PT ICD-10 Diagnosis Description I89.0 Lymphedema, not elsewhere classified I87.323 Chronic venous hypertension (idiopathic) with inflammation of bilateral lower extremity I50.42  Chronic combined systolic (congestive) and diastolic (congestive) heart failure Quantity: 1 Electronic Signature(s) Signed: 11/25/2022 5:19:55 PM By: Worthy Keeler PA-C Entered By: Worthy Keeler on 11/25/2022 17:19:55

## 2022-11-26 NOTE — Progress Notes (Signed)
[name], [name] ([id number]) [phone] [id number] Page 1 of 5 Visit Report for 11/25/2022 Abuse Risk Screen Details Patient Name: Date of Service: [name] 11/25/2022 2:45 PM Medical Record Number: [id number] Patient Account Number: [id number] Date of Birth/Sex: Treating RN: 08-22-1946 (77 y.o. [name] Primary Care [name]: [name] [name]: Treating [name]/Extender: [name] Self, Referral Weeks in Treatment: 0 Abuse Risk Screen Items Answer Electronic Signature(s) Signed: 11/26/2022 8:15:46 AM By: [name] Loud MSN RN CNS WTA Entered By: [name] on 11/25/2022 14:59:42 -------------------------------------------------------------------------------- Activities of Daily Living Details Patient Name: Date of Service: [name] 11/25/2022 2:45 PM Medical Record Number: [id number] Patient Account Number: [id number] Date of Birth/Sex: Treating RN: 1946/04/12 (77 y.o. [name] Primary Care Stormee Duda: [name] [name] Clinician: Referring Jacquel Mccamish: Treating Yudit Modesitt/Extender: [name] Self, Referral Weeks in Treatment: 0 Activities of Daily Living Items Answer Activities of Daily Living (Please select one for each item) Drive Automobile Not Able T Medications ake Need Assistance Use T elephone Completely Able Care for Appearance Need Assistance Use T oilet Need Assistance Bath / Shower Need Assistance Dress Self Need Assistance Feed Self Completely Able Walk Not Able Get In / Out Bed Need Assistance Housework Not Able Prepare Meals Not Able Handle Money Not Able Shop for Self Not [name], [name] ([id number]) [phone] [id number] Page 2 of 5 Electronic Signature(s) Signed: 11/26/2022 8:15:46 AM By: [name] Loud MSN RN CNS WTA Entered By: [name] on 11/25/2022  15:00:19 -------------------------------------------------------------------------------- Education Screening Details Patient Name: Date of Service: [name] 11/25/2022 2:45 PM Medical Record Number: [id number] Patient Account Number: [id number] Date of Birth/Sex: Treating RN: [birth year] (77 y.o. [name] Primary Care Pasco Marchitto: [name] [name]: Treating [name]/Extender: [name] Self, Referral Weeks in Treatment: 0 Learning Preferences/Education Level/Primary Language Learning Preference: Explanation Highest Education Level: High School Preferred Language: English Cognitive Barrier Language Barrier: No Translator Needed: No Memory Deficit: No Emotional Barrier: No Cultural/Religious Beliefs Affecting Medical Care: No Physical Barrier Impaired Vision: Yes Glasses Impaired Hearing: No Knowledge/Comprehension Knowledge Level: High Comprehension Level: High Ability to understand written instructions: High Ability to understand verbal instructions: High Motivation Anxiety Level: Calm Cooperation: Cooperative Education Importance: Acknowledges Need Interest in Health Problems: Asks Questions Perception: Coherent Willingness to Engage in Self-Management High Activities: Readiness to Engage in Self-Management High Activities: Electronic Signature(s) Signed: 11/26/2022 8:15:46 AM By: [name] Loud MSN RN CNS WTA Entered By: [name] on 11/25/2022 15:00:53 [name] ([id number]) [id number] Page 3 of 5 -------------------------------------------------------------------------------- Fall Risk Assessment Details Patient Name: Date of Service: [name] 11/25/2022 2:45 PM Medical Record Number: [id number] Patient Account Number: [id number] Date of Birth/Sex: Treating RN: 1945-10-08 (77 y.o. [name] Primary Care Jamesrobert Ohanesian: [name] [name] Clinician: Referring  [name]: Treating [name]/Extender: [name] Self, Referral Weeks in Treatment: 0 Fall Risk Assessment Items Have you had 2 or more falls in the last 12 monthso 0 No Have you had any fall that resulted in injury in the last 12 monthso 0 No FALLS RISK SCREEN History of falling - immediate or within 3 months 25 Yes Secondary diagnosis (Do you have 2 or more medical diagnoseso) 0 No Ambulatory aid None/bed rest/wheelchair/nurse 0 No Crutches/cane/walker 0 No Furniture 0 No Intravenous therapy Access/Saline/Heparin Lock 0 No Gait/Transferring Normal/ bed rest/ wheelchair 0 Yes Weak (short steps with or without shuffle, stooped but able to lift head while walking, may seek 0 No support  from furniture) Impaired (short steps with shuffle, may have difficulty arising from chair, head down, impaired 0 No balance) Mental Status Oriented to own ability 0 Yes Electronic Signature(s) Signed: 11/26/2022 8:15:46 AM By: [name] Loud MSN RN CNS WTA Entered By: [name] on 11/25/2022 15:01:37 -------------------------------------------------------------------------------- Foot Assessment Details Patient Name: Date of Service: [name] 11/25/2022 2:45 PM Medical Record Number: [id number] Patient Account Number: [id number] Date of Birth/Sex: Treating RN: Dec 29, 1945 (77 y.o. [name] Primary Care [name]: [name] [name] Clinician: Referring [name]: Treating [name]/Extender: [name] Self, Referral Weeks in Treatment: 0 Foot Assessment Items Site Locations [place], [place] ([id number]) [phone].pdf Page 4 of 5 + = Sensation present, - = Sensation absent, C = Callus, U = Ulcer R = Redness, W = Warmth, M = Maceration, PU = Pre-ulcerative lesion F = Fissure, S = Swelling, D = Dryness Assessment Right: Left: [name] Deformity: No No Prior Foot Ulcer: No No Prior Amputation: No No Charcot Joint: No No Ambulatory Status:  Non-ambulatory Assistance Device: Wheelchair Gait: Administrator, arts) Signed: 11/26/2022 8:15:46 AM By: [name] Loud MSN RN CNS WTA Entered By: [name] on 11/25/2022 15:02:22 -------------------------------------------------------------------------------- Nutrition Risk Screening Details Patient Name: Date of Service: [name] 11/25/2022 2:45 PM Medical Record Number: [id number] Patient Account Number: [id number] Date of Birth/Sex: Treating RN: 1946-01-28 (77 y.o. [name] Primary Care Kalei Meda: [name] [name] Clinician: Referring Chadley Dziedzic: Treating Maguire Killmer/Extender: [name] Self, Referral Weeks in Treatment: 0 Height (in): Weight (lbs): Body Mass Index (BMI): Nutrition Risk Screening Items Score Screening NUTRITION RISK SCREEN: I have an illness or condition that made me change the kind and/or amount of food I eat 0 No I eat fewer than two meals per day 0 No I eat few fruits and vegetables, or milk products 0 No I have three or more drinks of beer, liquor or wine almost every day 0 No I have tooth or mouth problems that make it hard for me to eat 0 No [name], [name] ([id number]) [id number] Page 5 of 5 I don't always have enough money to buy the food I need 0 No I eat alone most of the time 0 No I take three or more different prescribed or over-the-counter drugs a day 0 No Without wanting to, I have lost or gained 10 pounds in the last six months 0 No I am not always physically able to shop, cook and/or feed myself 0 No Nutrition Protocols Good Risk Protocol 0 No interventions needed Moderate Risk Protocol High Risk Proctocol Risk Level: Good Risk Score: 0 Electronic Signature(s) Signed: 11/26/2022 8:15:46 AM By: [name] Loud MSN RN CNS WTA Entered By: [name] on 11/25/2022 15:02:01

## 2022-12-10 ENCOUNTER — Ambulatory Visit: Payer: Medicare Other | Admitting: Oncology

## 2022-12-31 ENCOUNTER — Telehealth: Payer: Self-pay | Admitting: *Deleted

## 2022-12-31 NOTE — Telephone Encounter (Signed)
Patient called asking if she is supposed to be on her pills she is to take for 5 years, She states she just found out that [facility] has not been giving them to her because it is not on her medicine list. Please advise. If she is to be taking them, [facility] will need an order to give it.

## 2022-12-31 NOTE — Telephone Encounter (Signed)
[name] please send an order for her Anastrozole to [facility] where patient is resiidiing at this time

## 2022-12-31 NOTE — Telephone Encounter (Signed)
Yes she needs to take it until dec 2024

## 2022-12-31 NOTE — Telephone Encounter (Signed)
Dr. Smith Robert sent me a message to send in anastrozole daily 1 mg and to please start it when you get the medication at [facility]. Then I faxed the order to [facility]. Fax [phone] and the fax transmittal went through

## 2023-01-19 ENCOUNTER — Encounter: Payer: Self-pay | Admitting: Oncology

## 2023-01-19 ENCOUNTER — Inpatient Hospital Stay: Payer: Medicare Other | Attending: Oncology | Admitting: Oncology

## 2023-01-19 ENCOUNTER — Inpatient Hospital Stay: Payer: Medicare Other

## 2023-01-19 ENCOUNTER — Other Ambulatory Visit: Payer: Self-pay | Admitting: *Deleted

## 2023-01-19 VITALS — BP 139/57 | HR 68 | Temp 96.6°F | Resp 18 | Ht 69.0 in | Wt 243.0 lb

## 2023-01-19 DIAGNOSIS — Z5181 Encounter for therapeutic drug level monitoring: Secondary | ICD-10-CM

## 2023-01-19 DIAGNOSIS — Z170 Estrogen receptor positive status [ER+]: Secondary | ICD-10-CM | POA: Insufficient documentation

## 2023-01-19 DIAGNOSIS — Z79811 Long term (current) use of aromatase inhibitors: Secondary | ICD-10-CM | POA: Insufficient documentation

## 2023-01-19 DIAGNOSIS — D649 Anemia, unspecified: Secondary | ICD-10-CM | POA: Diagnosis not present

## 2023-01-19 DIAGNOSIS — C50411 Malignant neoplasm of upper-outer quadrant of right female breast: Secondary | ICD-10-CM | POA: Insufficient documentation

## 2023-01-19 DIAGNOSIS — Z853 Personal history of malignant neoplasm of breast: Secondary | ICD-10-CM

## 2023-01-19 DIAGNOSIS — Z87891 Personal history of nicotine dependence: Secondary | ICD-10-CM | POA: Insufficient documentation

## 2023-01-19 MED ORDER — ANASTROZOLE 1 MG PO TABS: 1.0000 mg | ORAL_TABLET | Freq: Every day | ORAL | 6 refills | Status: DC

## 2023-01-19 NOTE — Progress Notes (Signed)
Hematology/Oncology Consult note [facility]  [phone] [phone]  Patient Care Team: Emogene [name], MD as PCP - General (Family Medicine) Antonieta Iba, MD as PCP - Cardiology (Cardiology) Antonieta Iba, MD as Consulting Physician (Cardiology)   Name of the patient: [name]  [id number]  [birth year]   Date of visit: 01/19/23  Diagnosis-history of stage I right breast cancer  Chief complaint/ Reason for visit-routine follow-up of breast cancer  Heme/Onc history:  patient is a 77 year old female with a prior history of left breast cancer in 2006.  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  Mammogram and ultrasound in August 2019 showed 0.9 x 0.9 x 0.8 cm irregular spiculated mass in the 11 o'clock position of the right breast.  There was a concern for an abnormal intramammary lymph node as well.  No other abnormal axillary adenopathy noted.  Patient underwent ultrasound-guided biopsy of both the right breast mass as well as the axillary lymph node lymph node biopsy was negative for malignancy.  Biopsy of the right breast mass showed 6 mm, grade 1 ER PR positive and HER-2/neu negative tumor.   Patient is morbidly obese and has difficulty with ambulation.  She uses a cane or a walker to ambulate around the house.  She sees Dr. Mariah Milling for her cardiology issues.  Reports that her appetite is good and she denies any unintentional weight loss or any new aches or pains anywhere.  Family history significant for breast cancer in her sister who died from breast cancer in her 43s.  genetic testing was negative   Final pathology showed 7 mm grade 2 invasive mammary carcinoma ER PR positive her 2 negative. Negative nodes. Clear margins   Oncotype score came as low risk 8 and she does not require adjuvant chemotherapy She completed adjuvant radiation and started Arimidex in December  2019      Interval history-patient current clear resides at [facility].  She was hospitalized in December 2023 after a fall as well as in January.  After going to [facility] she has not been getting her Arimidex.  ECOG PS- 3 Pain scale- 3  Review of systems- Review of Systems  Constitutional:  Positive for malaise/fatigue. Negative for chills, fever and weight loss.  HENT:  Negative for congestion, ear discharge and nosebleeds.   Eyes:  Negative for blurred vision.  Respiratory:  Negative for cough, hemoptysis, sputum production, shortness of breath and wheezing.   Cardiovascular:  Negative for chest pain, palpitations, orthopnea and claudication.  Gastrointestinal:  Negative for abdominal pain, blood in stool, constipation, diarrhea, heartburn, melena, nausea and vomiting.  Genitourinary:  Negative for dysuria, flank pain, frequency, hematuria and urgency.  Musculoskeletal:  Negative for back pain, joint pain and myalgias.  Skin:  Negative for rash.  Neurological:  Negative for dizziness, tingling, focal weakness, seizures, weakness and headaches.  Endo/Heme/Allergies:  Does not bruise/bleed easily.  Psychiatric/Behavioral:  Negative for depression and suicidal ideas. The patient does not have insomnia.       No Known Allergies   Past Medical History:  Diagnosis Date   Abnormality of gait    Acute respiratory failure with hypoxia (HCC) 06/13/2022   Breast cancer (HCC) 02/05/2005   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    Breast cancer (HCC) 2019   right breast   CHF (congestive heart failure) (HCC)  March 28, 2017 echo: EF 50-55%; 1) echo 07/2011: EF 20-25%, mild concentric hypertrophy, diffuse HK, regional WMA cannot be excluded, grade 1 DD, mitral valve with mild regurg, LA mildly dilated). 2) EF 40-45%, mild AS,                     Chronic kidney disease    Complication of  anesthesia 2006   pt states "hard to wake up" after L breast surgery    Edema    GERD (gastroesophageal reflux disease)    Headache    Heart murmur    Hypertension    Hypokinesis    global, EF 35-45 % from Echo.   Malignant neoplasm of breast (female), unspecified site 06/14/2018   RIGHT T1b, N0; ER/PR positive, HER-2 negative.   Neuropathy    Obesity    Pain in joint, site unspecified    Personal history of chemotherapy 2006   left breast   Personal history of radiation therapy 2019   right breast   Personal history of radiation therapy 2006   left breast   Pneumonia    Sleep apnea    Tinea pedis    Unspecified hearing loss    bilateral     Past Surgical History:  Procedure Laterality Date   BREAST BIOPSY Right 2019   11 oc-INVASIVE MAMMARY CARCINOMA, NO SPECIAL TYPE   BREAST BIOPSY Right 2019   1030 oc- neg   BREAST LUMPECTOMY  2006   left breast    CATARACT EXTRACTION     left eye   HERNIA REPAIR  05/25/2006   Incarcerated omentum in ventral hernia, Ventralight mesh, combined lap/ open with omental resection.    PARTIAL MASTECTOMY WITH NEEDLE LOCALIZATION Right 06/14/2018   Procedure: PARTIAL MASTECTOMY WITH NEEDLE LOCALIZATION;  Surgeon: Earline Mayotte, MD;  Location: [facility] ORS;  Service: General;  Laterality: Right;   PORT-A-CATH REMOVAL  05/25/2006   SENTINEL NODE BIOPSY Right 06/14/2018   Procedure: SENTINEL NODE BIOPSY;  Surgeon: Earline Mayotte, MD;  Location: [facility] ORS;  Service: General;  Laterality: Right;   TUBAL LIGATION      Social History   Socioeconomic History   Marital status: Divorced    Spouse name: Not on file   Number of children: Not on file   Years of education: Not on file   Highest education level: Not on file  Occupational History   Occupation: retired  Tobacco Use   Smoking status: Former    Packs/day: 0.50    Years: 30.00    Additional pack years: 0.00    Total pack years: 15.00    Types: Cigarettes    Quit date:  07/14/2000    Years since quitting: 22.5   Smokeless tobacco: Never  Vaping Use   Vaping Use: Never used  Substance and Sexual Activity   Alcohol use: No   Drug use: Not Currently    Types: Marijuana    Comment: endorses prior marijuana use 1999   Sexual activity: Never  Other Topics Concern   Not on file  Social History Narrative   Not on file   Social Determinants of Health   Financial Resource Strain: Not on file  Food Insecurity: No Food Insecurity (09/11/2022)   Hunger Vital Sign    Worried About Running Out of Food in the Last Year: Never true    Ran Out of Food in the Last Year: Never true  Transportation Needs: No Transportation Needs (09/11/2022)   PRAPARE -  Administrator, Civil Service (Medical): No    Lack of Transportation (Non-Medical): No  Physical Activity: Not on file  Stress: Not on file  Social Connections: Not on file  Intimate Partner Violence: Not At Risk (09/11/2022)   Humiliation, Afraid, Rape, and Kick questionnaire    Fear of Current or Ex-Partner: No    Emotionally Abused: No    Physically Abused: No    Sexually Abused: No    Family History  Problem Relation Age of Onset   Hypertension Mother        deceased 58   Cataracts Mother    Breast cancer Other 4       maternal half-sister; deceased 19   Coronary artery disease Other      Current Outpatient Medications:    acetaminophen (TYLENOL) 325 MG tablet, Take 2 tablets (650 mg total) by mouth every 6 (six) hours as needed for mild pain (or Fever >/= 101)., Disp: , Rfl:    anastrozole (ARIMIDEX) 1 MG tablet, TAKE 1 TABLET BY MOUTH ONCE DAILY. MUST MAKE AN APPOINTMENT TO SEE DR. Delainee Tramel., Disp: 90 tablet, Rfl: 0   ascorbic acid (VITAMIN C) 500 MG tablet, Take 1 tablet by mouth 2 (two) times daily., Disp: , Rfl:    aspirin EC 81 MG tablet, Take 81 mg by mouth daily., Disp: , Rfl:    bisacodyl (DULCOLAX) 5 MG EC tablet, Take 2 tablets (10 mg total) by mouth at bedtime. Skip the dose if no  constipation, Disp: 30 tablet, Rfl: 0   cloNIDine (CATAPRES) 0.1 MG tablet, Take 1 tablet (0.1 mg total) by mouth 2 (two) times daily as needed (systolic BP > 150 or diastolic BP > 95)., Disp: 60 tablet, Rfl: 0   [employer] CALCIUM + D3 600-20 MG-MCG TABS, TAKE 1 TABLET BY MOUTH TWICE A DAY, Disp: 120 tablet, Rfl: 1   diclofenac Sodium (VOLTAREN) 1 % GEL, Apply 2 g topically 4 (four) times daily., Disp: , Rfl:    Emollient (EUCERIN) lotion, Apply topically 2 (two) times daily. Apply to dry skin especially bilateral lower extremities and arms., Disp: 240 mL, Rfl: 0   empagliflozin (JARDIANCE) 10 MG TABS tablet, Take 1 tablet (10 mg total) by mouth daily before breakfast., Disp: 30 tablet, Rfl: 0   furosemide (LASIX) 20 MG tablet, Take 20 mg by mouth daily., Disp: , Rfl:    gabapentin (NEURONTIN) 300 MG capsule, Take 300 mg by mouth 3 (three) times daily., Disp: , Rfl:    HYDROcodone-acetaminophen (NORCO/VICODIN) 5-325 MG tablet, Take 1 tablet by mouth 3 (three) times daily as needed., Disp: , Rfl:    Incontinence Supply Disposable (BLADDER CONTROL PADS EX ABSORB) MISC, Apply 1 Dose topically as needed. , Disp: , Rfl:    ipratropium-albuterol (DUONEB) 0.5-2.5 (3) MG/3ML SOLN, Take by nebulization., Disp: , Rfl:    isosorbide mononitrate (IMDUR) 60 MG 24 hr tablet, Take 1 tablet (60 mg total) by mouth 2 (two) times daily., Disp: 60 tablet, Rfl: 0   loratadine (CLARITIN) 10 MG tablet, Take 10 mg by mouth daily., Disp: , Rfl:    losartan (COZAAR) 25 MG tablet, Take 25 mg by mouth daily., Disp: , Rfl:    losartan (COZAAR) 50 MG tablet, Take 50 mg by mouth daily., Disp: , Rfl:    omeprazole (PRILOSEC) 40 MG capsule, Take 40 mg by mouth daily., Disp: , Rfl:    oxybutynin (DITROPAN) 5 MG tablet, Take 5 mg by mouth 2 (two) times daily., Disp: ,  Rfl:    PAXLOVID, 300/100, 20 x 150 MG & 10 x 100MG  TBPK, Take by mouth., Disp: , Rfl:    polyethylene glycol (MIRALAX / GLYCOLAX) 17 g packet, Take 17 g by mouth daily.  Skip the dose if no constipation, Disp: 14 each, Rfl: 0   traMADol (ULTRAM) 50 MG tablet, Take 1 tablet by mouth every 6 (six) hours as needed., Disp: , Rfl:   Physical exam:  Vitals:   01/19/23 1315  BP: (!) 173/74  Pulse: (!) 57  Resp: 18  Temp: (!) 96.6 F (35.9 C)  TempSrc: Tympanic  SpO2: 98%  Weight: 243 lb (110.2 kg)  Height: 5\' 9"  (1.753 m)   Physical Exam Constitutional:      Comments: Sitting in a wheelchair. Appears in no acute distress  Cardiovascular:     Rate and Rhythm: Normal rate and regular rhythm.     Heart sounds: Normal heart sounds.  Pulmonary:     Effort: Pulmonary effort is normal.     Breath sounds: Normal breath sounds.  Abdominal:     General: Bowel sounds are normal.     Palpations: Abdomen is soft.  Skin:    General: Skin is warm and dry.  Neurological:     Mental Status: She is alert and oriented to person, place, and time.    Breast exam was performed in seated and lying down position. Patient is status post right lumpectomy with a well-healed surgical scar. No evidence of any palpable masses. No evidence of axillary adenopathy. No evidence of any palpable masses or lumps in the left breast. No evidence of leftt axillary adenopathy      Latest Ref Rng & Units 09/16/2022    4:39 AM  CMP  Glucose 70 - 99 mg/dL 84   BUN 8 - 23 mg/dL 14   Creatinine 1.61 - 1.00 mg/dL 0.96   Sodium 045 - 409 mmol/L 138   Potassium 3.5 - 5.1 mmol/L 3.6   Chloride 98 - 111 mmol/L 106   CO2 22 - 32 mmol/L 26   Calcium 8.9 - 10.3 mg/dL 8.5       Latest Ref Rng & Units 09/16/2022    4:39 AM  CBC  WBC 4.0 - 10.5 K/uL 7.9   Hemoglobin 12.0 - 15.0 g/dL 9.1   Hematocrit 81.1 - 46.0 % 29.2   Platelets 150 - 400 K/uL 283      Assessment and plan- Patient is a 77 y.o. female with history of stage I right breast cancer here for routine follow-up  Patient is unable to stand on examination table.  Limited examThat could be done today did not reveal any palpable  masses.  She will need a a screening mammogram in September 2024 which we will schedule.  Clinically otherwise no concerning signs and symptoms of recurrence on today's exam.  She will restart her Arimidex and we have mentioned this in her papers as well as send Arimidex prescription to the pharmacy at [facility].  She will need to continue this until December 2024  Normocytic anemia: Will repeat CBC ferritin and iron studies and B12 in 6 months and see her thereafter   Visit Diagnosis 1. Visit for monitoring Arimidex therapy   2. Encounter for follow-up surveillance of breast cancer   3. Normocytic anemia      Dr. Owens Shark, MD, MPH [facility] [phone] 01/19/2023 1:28 PM

## 2023-01-19 NOTE — Addendum Note (Signed)
Addended by: Corene Cornea on: 01/19/2023 02:56 PM   Modules accepted: Orders

## 2023-01-26 ENCOUNTER — Encounter: Payer: Self-pay | Admitting: Family

## 2023-01-26 ENCOUNTER — Ambulatory Visit: Payer: Medicare Other | Attending: Family | Admitting: Family

## 2023-01-26 VITALS — BP 174/84 | HR 71 | Wt 242.0 lb

## 2023-01-26 DIAGNOSIS — Z170 Estrogen receptor positive status [ER+]: Secondary | ICD-10-CM

## 2023-01-26 DIAGNOSIS — I890 Lymphedema, not elsewhere classified: Secondary | ICD-10-CM | POA: Insufficient documentation

## 2023-01-26 DIAGNOSIS — K219 Gastro-esophageal reflux disease without esophagitis: Secondary | ICD-10-CM | POA: Diagnosis not present

## 2023-01-26 DIAGNOSIS — C50911 Malignant neoplasm of unspecified site of right female breast: Secondary | ICD-10-CM | POA: Diagnosis not present

## 2023-01-26 DIAGNOSIS — Z87891 Personal history of nicotine dependence: Secondary | ICD-10-CM | POA: Diagnosis not present

## 2023-01-26 DIAGNOSIS — G4733 Obstructive sleep apnea (adult) (pediatric): Secondary | ICD-10-CM | POA: Diagnosis not present

## 2023-01-26 DIAGNOSIS — I10 Essential (primary) hypertension: Secondary | ICD-10-CM

## 2023-01-26 DIAGNOSIS — Z79899 Other long term (current) drug therapy: Secondary | ICD-10-CM | POA: Insufficient documentation

## 2023-01-26 DIAGNOSIS — I428 Other cardiomyopathies: Secondary | ICD-10-CM | POA: Diagnosis not present

## 2023-01-26 DIAGNOSIS — I509 Heart failure, unspecified: Secondary | ICD-10-CM | POA: Insufficient documentation

## 2023-01-26 DIAGNOSIS — Z853 Personal history of malignant neoplasm of breast: Secondary | ICD-10-CM | POA: Insufficient documentation

## 2023-01-26 DIAGNOSIS — C50919 Malignant neoplasm of unspecified site of unspecified female breast: Secondary | ICD-10-CM | POA: Insufficient documentation

## 2023-01-26 DIAGNOSIS — I5032 Chronic diastolic (congestive) heart failure: Secondary | ICD-10-CM

## 2023-01-26 DIAGNOSIS — N189 Chronic kidney disease, unspecified: Secondary | ICD-10-CM | POA: Diagnosis present

## 2023-01-26 DIAGNOSIS — I130 Hypertensive heart and chronic kidney disease with heart failure and stage 1 through stage 4 chronic kidney disease, or unspecified chronic kidney disease: Secondary | ICD-10-CM | POA: Insufficient documentation

## 2023-01-26 NOTE — Progress Notes (Signed)
PCP: @ [facility] Primary Cardiologist: none  HPI:  [name] is a 77 y/o female with a history of breast cancer, CKD, HTN, GERD, sleep apnea, previous tobacco use and chronic heart failure.   Echo 03/04/22: EF of 50-55% along with mild LVH/ LAE and mild MR.   Admitted 09/10/22 due to lower extremity cellulitis. Was in the ED 08/22/22 due to near syncope.   She presents today for a follow-up visit with a chief complaint of moderate fatigue with minimal exertion. Chronic in nature. Has associated SOB, cough, palpitations, dizziness, edema and decreased appetite along with this. She does feel like her swelling is worsening in her lower legs.   Unable to consistently weigh herself because of her instability when standing. Not adding salt but says that she is eating less because the food "tastes so bad".   ROS: All systems negative except as listed in HPI, PMH and Problem List.  SH:  Social History   Socioeconomic History   Marital status: Divorced    Spouse name: Not on file   Number of children: Not on file   Years of education: Not on file   Highest education level: Not on file  Occupational History   Occupation: retired  Tobacco Use   Smoking status: Former    Packs/day: 0.50    Years: 30.00    Additional pack years: 0.00    Total pack years: 15.00    Types: Cigarettes    Quit date: 07/14/2000    Years since quitting: 22.5   Smokeless tobacco: Never  Vaping Use   Vaping Use: Never used  Substance and Sexual Activity   Alcohol use: No   Drug use: Not Currently    Types: Marijuana    Comment: endorses prior marijuana use 1999   Sexual activity: Never  Other Topics Concern   Not on file  Social History Narrative   Not on file   Social Determinants of Health   Financial Resource Strain: Not on file  Food Insecurity: No Food Insecurity (09/11/2022)   Hunger Vital Sign    Worried About Running Out of Food in the Last Year: Never true    Ran Out of Food in the Last  Year: Never true  Transportation Needs: No Transportation Needs (09/11/2022)   PRAPARE - Administrator, Civil Service (Medical): No    Lack of Transportation (Non-Medical): No  Physical Activity: Not on file  Stress: Not on file  Social Connections: Not on file  Intimate Partner Violence: Not At Risk (09/11/2022)   Humiliation, Afraid, Rape, and Kick questionnaire    Fear of Current or Ex-Partner: No    Emotionally Abused: No    Physically Abused: No    Sexually Abused: No    FH:  Family History  Problem Relation Age of Onset   Hypertension Mother        deceased 71   Cataracts Mother    Breast cancer Other 23       maternal half-sister; deceased 48   Coronary artery disease Other     Past Medical History:  Diagnosis Date   Abnormality of gait    Acute respiratory failure with hypoxia (HCC) 06/13/2022   Breast cancer (HCC) 02/05/2005   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    Breast cancer (HCC) 2019   right breast   CHF (congestive heart failure) (HCC)    March 28, 2017 echo: EF  50-55%; 1) echo 07/2011: EF 20-25%, mild concentric hypertrophy, diffuse HK, regional WMA cannot be excluded, grade 1 DD, mitral valve with mild regurg, LA mildly dilated). 2) EF 40-45%, mild AS,                     Chronic kidney disease    Complication of anesthesia 2006   pt states "hard to wake up" after L breast surgery    Edema    GERD (gastroesophageal reflux disease)    Headache    Heart murmur    Hypertension    Hypokinesis    global, EF 35-45 % from Echo.   Malignant neoplasm of breast (female), unspecified site 06/14/2018   RIGHT T1b, N0; ER/PR positive, HER-2 negative.   Neuropathy    Obesity    Pain in joint, site unspecified    Personal history of chemotherapy 2006   left breast   Personal history of radiation therapy 2019   right breast   Personal history of radiation therapy 2006    left breast   Pneumonia    Sleep apnea    Tinea pedis    Unspecified hearing loss    bilateral    Current Outpatient Medications  Medication Sig Dispense Refill   ascorbic acid (VITAMIN C) 500 MG tablet Take 1 tablet by mouth 2 (two) times daily.     aspirin EC 81 MG tablet Take 81 mg by mouth daily.     bisacodyl (DULCOLAX) 5 MG EC tablet Take 2 tablets (10 mg total) by mouth at bedtime. Skip the dose if no constipation 30 tablet 0   cloNIDine (CATAPRES) 0.1 MG tablet Take 1 tablet (0.1 mg total) by mouth 2 (two) times daily as needed (systolic BP > 150 or diastolic BP > 95). 60 tablet 0   acetaminophen (TYLENOL) 325 MG tablet Take 2 tablets (650 mg total) by mouth every 6 (six) hours as needed for mild pain (or Fever >/= 101).     anastrozole (ARIMIDEX) 1 MG tablet Take 1 tablet (1 mg total) by mouth daily. 30 tablet 6   CVS CALCIUM + D3 600-20 MG-MCG TABS TAKE 1 TABLET BY MOUTH TWICE A DAY 120 tablet 1   diclofenac Sodium (VOLTAREN) 1 % GEL Apply 2 g topically 4 (four) times daily.     Emollient (EUCERIN) lotion Apply topically 2 (two) times daily. Apply to dry skin especially bilateral lower extremities and arms. 240 mL 0   empagliflozin (JARDIANCE) 10 MG TABS tablet Take 1 tablet (10 mg total) by mouth daily before breakfast. 30 tablet 0   furosemide (LASIX) 20 MG tablet Take 20 mg by mouth daily.     gabapentin (NEURONTIN) 300 MG capsule Take 300 mg by mouth 3 (three) times daily.     HYDROcodone-acetaminophen (NORCO/VICODIN) 5-325 MG tablet Take 1 tablet by mouth 3 (three) times daily as needed.     Incontinence Supply Disposable (BLADDER CONTROL PADS EX ABSORB) MISC Apply 1 Dose topically as needed.      ipratropium-albuterol (DUONEB) 0.5-2.5 (3) MG/3ML SOLN Take by nebulization.     isosorbide mononitrate (IMDUR) 60 MG 24 hr tablet Take 1 tablet (60 mg total) by mouth 2 (two) times daily. 60 tablet 0   loratadine (CLARITIN) 10 MG tablet Take 10 mg by mouth daily.     losartan  (COZAAR) 25 MG tablet Take 25 mg by mouth daily.     losartan (COZAAR) 50 MG tablet Take 50 mg by mouth daily.  omeprazole (PRILOSEC) 40 MG capsule Take 40 mg by mouth daily.     oxybutynin (DITROPAN) 5 MG tablet Take 5 mg by mouth 2 (two) times daily.     PAXLOVID, 300/100, 20 x 150 MG & 10 x 100MG  TBPK Take by mouth.     polyethylene glycol (MIRALAX / GLYCOLAX) 17 g packet Take 17 g by mouth daily. Skip the dose if no constipation 14 each 0   traMADol (ULTRAM) 50 MG tablet Take 1 tablet by mouth every 6 (six) hours as needed.     No current facility-administered medications for this visit.   Vitals:   01/26/23 1139  BP: (!) 174/84  Pulse: 71  SpO2: 96%  Weight: 242 lb (109.8 kg)   Wt Readings from Last 3 Encounters:  01/26/23 242 lb (109.8 kg)  01/19/23 243 lb (110.2 kg)  09/09/22 243 lb (110.2 kg)   Lab Results  Component Value Date   CREATININE 0.93 09/16/2022   CREATININE 0.96 09/15/2022   CREATININE 0.92 09/14/2022   PHYSICAL EXAM:  General:  Well appearing. No resp difficulty HEENT: normal Neck: supple. JVP flat. No lymphadenopathy or thryomegaly appreciated. Cor: PMI normal. Regular rate & rhythm. No rubs, gallops or murmurs. Lungs: expiratory wheezing BUL, clear in lower lobes Abdomen: soft, nontender, nondistended. No hepatosplenomegaly. No bruits or masses.  Extremities: no cyanosis, clubbing, rash, 1+ pitting edema bilateral lower legs Neuro: alert & oriented x3, cranial nerves grossly intact. Moves all 4 extremities w/o difficulty. Affect pleasant.  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  - not adding salt  - unclear of fluid intake - continue jardiance 10mg  dailiy - continue losartan 50mg  daily - begin spironolactone 25mg  daily - order written for BMP to be done 02/04/23 and results faxed to [place] - does elevate her legs in the bed some during the day and then overnight weight with some improvement - BNP 09/09/22 was 113.1  2: HTN with CKD- - BP 174/84; adding spironolactone per above - sees PCP ([facility]) but is currently seeing PCP at SNF  - BMP 09/16/22 reviewed and showed sodium 138, potassium 3.6, creatinine 0.93 and GFR >60  3: OSA- - last sleep study was done 5 years ago and she's needing new supplies so that she can start wearing it  4: Breast cancer- - saw oncology [facility] Robert) 05/24  5: Lymphedema- - stage 2 - encouraged her to elevate her legs when sitting for long periods of time - went to wound center 03/24 - limited in her ability to exercise  Return in 1 month, sooner if needed. Will do labs at next visit.

## 2023-02-13 ENCOUNTER — Telehealth: Payer: Self-pay | Admitting: *Deleted

## 2023-02-13 NOTE — Telephone Encounter (Signed)
Patient called concerned that her right breast is larger than the left one and she now feels a lump in it as well as her caretaker at [facility] who assists her bathing. Sh e states that when she got to [facility] she was without her AI for 5 months and she ios asking what to do about the lump and increased breast size. Please advise

## 2023-02-13 NOTE — Telephone Encounter (Signed)
One of the nps can see her next week- lauren or sara

## 2023-02-17 ENCOUNTER — Inpatient Hospital Stay: Payer: Medicare Other | Admitting: Nurse Practitioner

## 2023-02-18 ENCOUNTER — Ambulatory Visit: Payer: Medicare Other | Admitting: Urology

## 2023-02-19 ENCOUNTER — Telehealth: Payer: Self-pay | Admitting: *Deleted

## 2023-02-19 NOTE — Telephone Encounter (Signed)
I called and got voice mail. I then called the cell phone. It was the daughter. She states that she will not be able to get her mom to come in tom. She has to get transportation and [facility]. She is ok with 6/17 at 1 pm. She will call her mother and tell her the date. The pt . Herself called in but she did not leave a number to call back.so I called the numbers on her demographics and ended up getting her daughter

## 2023-02-20 ENCOUNTER — Inpatient Hospital Stay: Payer: Medicare Other | Admitting: Nurse Practitioner

## 2023-02-23 ENCOUNTER — Inpatient Hospital Stay: Payer: Medicare Other | Attending: Oncology | Admitting: Nurse Practitioner

## 2023-02-23 ENCOUNTER — Encounter: Payer: Self-pay | Admitting: Nurse Practitioner

## 2023-02-23 VITALS — BP 159/77 | HR 63 | Temp 97.9°F | Ht 69.0 in | Wt 248.0 lb

## 2023-02-23 DIAGNOSIS — Z79811 Long term (current) use of aromatase inhibitors: Secondary | ICD-10-CM | POA: Insufficient documentation

## 2023-02-23 DIAGNOSIS — Z170 Estrogen receptor positive status [ER+]: Secondary | ICD-10-CM | POA: Insufficient documentation

## 2023-02-23 DIAGNOSIS — N6313 Unspecified lump in the right breast, lower outer quadrant: Secondary | ICD-10-CM | POA: Diagnosis not present

## 2023-02-23 DIAGNOSIS — C50411 Malignant neoplasm of upper-outer quadrant of right female breast: Secondary | ICD-10-CM | POA: Diagnosis not present

## 2023-02-23 NOTE — Progress Notes (Signed)
Hematology/Oncology Consult Note [facility]  [phone] [phone]  Patient Care Team: Emogene [name], MD as PCP - General (Family Medicine) Antonieta Iba, MD as PCP - Cardiology (Cardiology) Antonieta Iba, MD as Consulting Physician (Cardiology)   Name of the patient: [name]  [id number]  1945-12-22   Date of visit: 02/23/23  Diagnosis-history of stage I right breast cancer  Chief complaint/ Reason for visit- self palpated right breast mass  Heme/Onc history:  patient is a 77 year old female with a prior history of left breast cancer in 2006.  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  Mammogram and ultrasound in August 2019 showed 0.9 x 0.9 x 0.8 cm irregular spiculated mass in the 11 o'clock position of the right breast.  There was a concern for an abnormal intramammary lymph node as well.  No other abnormal axillary adenopathy noted.  Patient underwent ultrasound-guided biopsy of both the right breast mass as well as the axillary lymph node lymph node biopsy was negative for malignancy.  Biopsy of the right breast mass showed 6 mm, grade 1 ER PR positive and HER-2/neu negative tumor.   Patient is morbidly obese and has difficulty with ambulation.  She uses a cane or a walker to ambulate around the house.  She sees Dr. Mariah Milling for her cardiology issues.  Reports that her appetite is good and she denies any unintentional weight loss or any new aches or pains anywhere.  Family history significant for breast cancer in her sister who died from breast cancer in her 68s.  genetic testing was negative   Final pathology showed 7 mm grade 2 invasive mammary carcinoma ER PR positive her 2 negative. Negative nodes. Clear margins   Oncotype score came as low risk 8 and she does not require adjuvant chemotherapy She completed adjuvant radiation and started Arimidex in December 2019       Interval history- Patient is 77 year old female with above history of left and right breast cancer, currently on arimidex who presents to clinic for concerns of right breast mass. She previously did not receive arimidex for approximately 5 months. About a week ago she performed self breast exam and felt a possible mass in right breast, lower outer quadrant. Area is not painful. Denies radiation to the area. Is worried it is breast cancer. No other complaints and feels at baseline.    ECOG PS- 3 Pain scale- 0  Review of systems- Review of Systems  Constitutional:  Positive for malaise/fatigue. Negative for chills, fever and weight loss.  Respiratory:  Negative for cough, hemoptysis, sputum production, shortness of breath and wheezing.   Cardiovascular:  Negative for chest pain, palpitations and claudication.  Gastrointestinal:  Negative for abdominal pain, blood in stool, constipation, diarrhea and melena.  Musculoskeletal:  Negative for back pain, joint pain and myalgias.  Skin:  Negative for rash.  Neurological:  Positive for weakness. Negative for dizziness, seizures and headaches.  Endo/Heme/Allergies:  Does not bruise/bleed easily.  Psychiatric/Behavioral:  The patient is nervous/anxious.      No Known Allergies   Past Medical History:  Diagnosis Date   Abnormality of gait    Acute respiratory failure with hypoxia (HCC) 06/13/2022   Breast cancer (HCC) 02/05/2005   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    Breast cancer (HCC) 2019   right breast   CHF (congestive heart  failure) (HCC)    March 28, 2017 echo: EF 50-55%; 1) echo 07/2011: EF 20-25%, mild concentric hypertrophy, diffuse HK, regional WMA cannot be excluded, grade 1 DD, mitral valve with mild regurg, LA mildly dilated). 2) EF 40-45%, mild AS,                     Chronic kidney disease    Complication of anesthesia 2006   pt states  "hard to wake up" after L breast surgery    Edema    GERD (gastroesophageal reflux disease)    Headache    Heart murmur    Hypertension    Hypokinesis    global, EF 35-45 % from Echo.   Malignant neoplasm of breast (female), unspecified site 06/14/2018   RIGHT T1b, N0; ER/PR positive, HER-2 negative.   Neuropathy    Obesity    Pain in joint, site unspecified    Personal history of chemotherapy 2006   left breast   Personal history of radiation therapy 2019   right breast   Personal history of radiation therapy 2006   left breast   Pneumonia    Sleep apnea    Tinea pedis    Unspecified hearing loss    bilateral     Past Surgical History:  Procedure Laterality Date   BREAST BIOPSY Right 2019   11 oc-INVASIVE MAMMARY CARCINOMA, NO SPECIAL TYPE   BREAST BIOPSY Right 2019   1030 oc- neg   BREAST LUMPECTOMY  2006   left breast    CATARACT EXTRACTION     left eye   HERNIA REPAIR  05/25/2006   Incarcerated omentum in ventral hernia, Ventralight mesh, combined lap/ open with omental resection.    PARTIAL MASTECTOMY WITH NEEDLE LOCALIZATION Right 06/14/2018   Procedure: PARTIAL MASTECTOMY WITH NEEDLE LOCALIZATION;  Surgeon: Earline Mayotte, MD;  Location: [facility] ORS;  Service: General;  Laterality: Right;   PORT-A-CATH REMOVAL  05/25/2006   SENTINEL NODE BIOPSY Right 06/14/2018   Procedure: SENTINEL NODE BIOPSY;  Surgeon: Earline Mayotte, MD;  Location: [facility] OR[facility];  Service: General;  Laterality: Right;   TUBAL LIGATION      Social History   Socioeconomic History   Marital status: Divorced    Spouse name: Not on file   Number of children: Not on file   Years of education: Not on file   Highest education level: Not on file  Occupational History   Occupation: retired  Tobacco Use   Smoking status: Former    Packs/day: 0.50    Years: 30.00    Additional pack years: 0.00    Total pack years: 15.00    Types: Cigarettes    Quit date: 07/14/2000    Years since quitting:  22.6   Smokeless tobacco: Never  Vaping Use   Vaping Use: Never used  Substance and Sexual Activity   Alcohol use: No   Drug use: Not Currently    Types: Marijuana    Comment: endorses prior marijuana use 1999   Sexual activity: Never  Other Topics Concern   Not on file  Social History Narrative   Not on file   Social Determinants of Health   Financial Resource Strain: Not on file  Food Insecurity: No Food Insecurity (09/11/2022)   Hunger Vital Sign    Worried About Running Out of Food in the Last Year: Never true    Ran Out of Food in the Last Year: Never true  Transportation Needs: No Transportation Needs (  09/11/2022)   PRAPARE - Administrator, Civil Service (Medical): No    Lack of Transportation (Non-Medical): No  Physical Activity: Not on file  Stress: Not on file  Social Connections: Not on file  Intimate Partner Violence: Not At Risk (09/11/2022)   Humiliation, Afraid, Rape, and Kick questionnaire    Fear of Current or Ex-Partner: No    Emotionally Abused: No    Physically Abused: No    Sexually Abused: No    Family History  Problem Relation Age of Onset   Hypertension Mother        deceased 22   Cataracts Mother    Breast cancer Other 29       maternal half-sister; deceased 29   Coronary artery disease Other      Current Outpatient Medications:    acetaminophen (TYLENOL) 325 MG tablet, Take 2 tablets (650 mg total) by mouth every 6 (six) hours as needed for mild pain (or Fever >/= 101)., Disp: , Rfl:    anastrozole (ARIMIDEX) 1 MG tablet, Take 1 tablet (1 mg total) by mouth daily., Disp: 30 tablet, Rfl: 6   ascorbic acid (VITAMIN C) 500 MG tablet, Take 1 tablet by mouth 2 (two) times daily., Disp: , Rfl:    aspirin EC 81 MG tablet, Take 81 mg by mouth daily., Disp: , Rfl:    bisacodyl (DULCOLAX) 5 MG EC tablet, Take 2 tablets (10 mg total) by mouth at bedtime. Skip the dose if no constipation, Disp: 30 tablet, Rfl: 0   cloNIDine (CATAPRES) 0.1 MG  tablet, Take 1 tablet (0.1 mg total) by mouth 2 (two) times daily as needed (systolic BP > 150 or diastolic BP > 95)., Disp: 60 tablet, Rfl: 0   cloNIDine HCl (KAPVAY) 0.1 MG TB12 ER tablet, Take 0.1 mg by mouth 2 (two) times daily. PRN, Disp: , Rfl:    [employer] CALCIUM + D3 600-20 MG-MCG TABS, TAKE 1 TABLET BY MOUTH TWICE A DAY, Disp: 120 tablet, Rfl: 1   diclofenac Sodium (VOLTAREN) 1 % GEL, Apply 2 g topically 4 (four) times daily., Disp: , Rfl:    Emollient (EUCERIN) lotion, Apply topically 2 (two) times daily. Apply to dry skin especially bilateral lower extremities and arms., Disp: 240 mL, Rfl: 0   empagliflozin (JARDIANCE) 10 MG TABS tablet, Take 1 tablet (10 mg total) by mouth daily before breakfast., Disp: 30 tablet, Rfl: 0   ferrous sulfate 325 (65 FE) MG tablet, Take 325 mg by mouth every other day., Disp: , Rfl:    furosemide (LASIX) 20 MG tablet, Take 20 mg by mouth daily., Disp: , Rfl:    gabapentin (NEURONTIN) 300 MG capsule, Take 300 mg by mouth 3 (three) times daily., Disp: , Rfl:    HYDROcodone-acetaminophen (NORCO/VICODIN) 5-325 MG tablet, Take 1 tablet by mouth 3 (three) times daily as needed., Disp: , Rfl:    Incontinence Supply Disposable (BLADDER CONTROL PADS EX ABSORB) MISC, Apply 1 Dose topically as needed. , Disp: , Rfl:    ipratropium-albuterol (DUONEB) 0.5-2.5 (3) MG/3ML SOLN, Take by nebulization., Disp: , Rfl:    isosorbide mononitrate (IMDUR) 60 MG 24 hr tablet, Take 1 tablet (60 mg total) by mouth 2 (two) times daily., Disp: 60 tablet, Rfl: 0   loratadine (CLARITIN) 10 MG tablet, Take 10 mg by mouth daily., Disp: , Rfl:    losartan (COZAAR) 50 MG tablet, Take 50 mg by mouth daily., Disp: , Rfl:    omeprazole (PRILOSEC) 40 MG capsule,  Take 40 mg by mouth daily., Disp: , Rfl:    oxybutynin (DITROPAN) 5 MG tablet, Take 5 mg by mouth 2 (two) times daily., Disp: , Rfl:    polyethylene glycol (MIRALAX / GLYCOLAX) 17 g packet, Take 17 g by mouth daily. Skip the dose if no  constipation, Disp: 14 each, Rfl: 0   traMADol HCl 25 MG TABS, Take 1 tablet by mouth every 6 (six) hours as needed., Disp: , Rfl:    vitamin B-12 (CYANOCOBALAMIN) 500 MCG tablet, Take 500 mcg by mouth daily., Disp: , Rfl:    losartan (COZAAR) 25 MG tablet, Take 25 mg by mouth daily. (Patient not taking: Reported on 01/26/2023), Disp: , Rfl:    PAXLOVID, 300/100, 20 x 150 MG & 10 x 100MG  TBPK, Take by mouth. (Patient not taking: Reported on 01/26/2023), Disp: , Rfl:   Physical exam:  Vitals:   02/23/23 1312  BP: (!) 159/77  Pulse: 63  Temp: 97.9 F (36.6 C)  TempSrc: Tympanic  SpO2: 98%  Weight: 248 lb (112.5 kg)  Height: 5\' 9"  (1.753 m)   Physical Exam Vitals reviewed.  Constitutional:      Comments: Sitting in a wheelchair. Appears in no acute distress  Cardiovascular:     Rate and Rhythm: Normal rate and regular rhythm.  Pulmonary:     Effort: Pulmonary effort is normal.     Breath sounds: Normal breath sounds.  Chest:     Comments: Breast exam was performed in seated position. Patient says she is unable to stand and pivot to exam table. She is s/p right lumpectomy with well healed surgical scar. At 8:00 approx 4 cm from nipple is linear skin thickening. No underlying masses palpated. No evidence of axillary adenopathy.  Skin:    General: Skin is warm and dry.     Coloration: Skin is not pale.  Neurological:     Mental Status: She is alert and oriented to person, place, and time.  Psychiatric:        Mood and Affect: Mood normal.        Behavior: Behavior normal.         Latest Ref Rng & Units 09/16/2022    4:39 AM  CMP  Glucose 70 - 99 mg/dL 84   BUN 8 - 23 mg/dL 14   Creatinine 1.61 - 1.00 mg/dL 0.96   Sodium 045 - 409 mmol/L 138   Potassium 3.5 - 5.1 mmol/L 3.6   Chloride 98 - 111 mmol/L 106   CO2 22 - 32 mmol/L 26   Calcium 8.9 - 10.3 mg/dL 8.5       Latest Ref Rng & Units 09/16/2022    4:39 AM  CBC  WBC 4.0 - 10.5 K/uL 7.9   Hemoglobin 12.0 - 15.0 g/dL 9.1    Hematocrit 81.1 - 46.0 % 29.2   Platelets 150 - 400 K/uL 283      Assessment and plan- Patient is a 77 y.o. female who returns to clinic for surveillance of  Right breast mass- lower outer quadrant, 8:00 approx 4 cm from nipple. Limited exam d/t her mobility limitations. No mass palpated on exam but she does have some thickened skin in this area. Feels benign. However, given limitations of evaluation, will plan to get diagnostic mammogram to evaluate further. She will continue her arimidex in meantime.  Stage I right breast cancer- diagnosed in 2019. On arimidex but missed approximately 5 months of medications when she transferred to [facility].  History  of left breast cancer- diagnosed in 2006 s/p lumpectomy.  Denies complaints of left breast.  Anemia- normocytic. Awaiting iron studies later this year with Dr Smith Robert.   Disposition:  Diagnostic right breast mammogram & ultrasound Follow up based on results- la    Visit Diagnosis 1. Mass of lower outer quadrant of right breast    Consuello Masse, DNP, AGNP-C, AOCNP [facility] [phone] (clinic) 02/23/2023

## 2023-02-24 ENCOUNTER — Ambulatory Visit: Payer: Medicare Other | Admitting: Nurse Practitioner

## 2023-03-02 ENCOUNTER — Ambulatory Visit
Admission: RE | Admit: 2023-03-02 | Discharge: 2023-03-02 | Disposition: A | Discharge status: Home / Self Care | Payer: Medicare Other | Source: Ambulatory Visit | Attending: Nurse Practitioner | Admitting: Nurse Practitioner

## 2023-03-02 DIAGNOSIS — N6313 Unspecified lump in the right breast, lower outer quadrant: Secondary | ICD-10-CM | POA: Insufficient documentation

## 2023-03-03 NOTE — Progress Notes (Deleted)
PCP: @ [facility] Primary Cardiologist: none  HPI:  [name] is a 77 y/o female with a history of breast cancer, CKD, HTN, GERD, sleep apnea, previous tobacco use and chronic heart failure.   Echo 03/04/22: EF of 50-55% along with mild LVH/ LAE and mild MR.   Admitted 09/10/22 due to lower extremity cellulitis. Was in the ED 08/22/22 due to near syncope.   She presents today for a follow-up visit with a chief complaint of   Unable to consistently weigh herself because of her instability when standing. Not adding salt but says that she is eating less because the food "tastes so bad".   ROS: All systems negative except as listed in HPI, PMH and Problem List.  SH:  Social History   Socioeconomic History   Marital status: Divorced    Spouse name: Not on file   Number of children: Not on file   Years of education: Not on file   Highest education level: Not on file  Occupational History   Occupation: retired  Tobacco Use   Smoking status: Former    Packs/day: 0.50    Years: 30.00    Additional pack years: 0.00    Total pack years: 15.00    Types: Cigarettes    Quit date: 07/14/2000    Years since quitting: 22.6   Smokeless tobacco: Never  Vaping Use   Vaping Use: Never used  Substance and Sexual Activity   Alcohol use: No   Drug use: Not Currently    Types: Marijuana    Comment: endorses prior marijuana use 1999   Sexual activity: Never  Other Topics Concern   Not on file  Social History Narrative   Not on file   Social Determinants of Health   Financial Resource Strain: Not on file  Food Insecurity: No Food Insecurity (09/11/2022)   Hunger Vital Sign    Worried About Running Out of Food in the Last Year: Never true    Ran Out of Food in the Last Year: Never true  Transportation Needs: No Transportation Needs (09/11/2022)   PRAPARE - Administrator, Civil Service (Medical): No    Lack of Transportation (Non-Medical): No  Physical Activity: Not on file   Stress: Not on file  Social Connections: Not on file  Intimate Partner Violence: Not At Risk (09/11/2022)   Humiliation, Afraid, Rape, and Kick questionnaire    Fear of Current or Ex-Partner: No    Emotionally Abused: No    Physically Abused: No    Sexually Abused: No    FH:  Family History  Problem Relation Age of Onset   Hypertension Mother        deceased 76   Cataracts Mother    Breast cancer Other 45       maternal half-sister; deceased 41   Coronary artery disease Other     Past Medical History:  Diagnosis Date   Abnormality of gait    Acute respiratory failure with hypoxia (HCC) 06/13/2022   Breast cancer (HCC) 02/05/2005   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    Breast cancer (HCC) 2019   right breast   CHF (congestive heart failure) (HCC)    March 28, 2017 echo: EF 50-55%; 1) echo 07/2011: EF 20-25%, mild concentric hypertrophy, diffuse HK, regional WMA cannot be excluded, grade 1 DD, mitral valve with mild regurg, LA mildly dilated). 2) EF 40-45%, mild AS,  Chronic kidney disease    Complication of anesthesia 2006   pt states "hard to wake up" after L breast surgery    Edema    GERD (gastroesophageal reflux disease)    Headache    Heart murmur    Hypertension    Hypokinesis    global, EF 35-45 % from Echo.   Malignant neoplasm of breast (female), unspecified site 06/14/2018   RIGHT T1b, N0; ER/PR positive, HER-2 negative.   Neuropathy    Obesity    Pain in joint, site unspecified    Personal history of chemotherapy 2006   left breast   Personal history of radiation therapy 2019   right breast   Personal history of radiation therapy 2006   left breast   Pneumonia    Sleep apnea    Tinea pedis    Unspecified hearing loss    bilateral    Current Outpatient Medications  Medication Sig Dispense Refill   acetaminophen (TYLENOL) 325 MG tablet Take 2  tablets (650 mg total) by mouth every 6 (six) hours as needed for mild pain (or Fever >/= 101).     anastrozole (ARIMIDEX) 1 MG tablet Take 1 tablet (1 mg total) by mouth daily. 30 tablet 6   ascorbic acid (VITAMIN C) 500 MG tablet Take 1 tablet by mouth 2 (two) times daily.     aspirin EC 81 MG tablet Take 81 mg by mouth daily.     bisacodyl (DULCOLAX) 5 MG EC tablet Take 2 tablets (10 mg total) by mouth at bedtime. Skip the dose if no constipation 30 tablet 0   cloNIDine (CATAPRES) 0.1 MG tablet Take 1 tablet (0.1 mg total) by mouth 2 (two) times daily as needed (systolic BP > 150 or diastolic BP > 95). 60 tablet 0   cloNIDine HCl (KAPVAY) 0.1 MG TB12 ER tablet Take 0.1 mg by mouth 2 (two) times daily. PRN     [employer] CALCIUM + D3 600-20 MG-MCG TABS TAKE 1 TABLET BY MOUTH TWICE A DAY 120 tablet 1   diclofenac Sodium (VOLTAREN) 1 % GEL Apply 2 g topically 4 (four) times daily.     Emollient (EUCERIN) lotion Apply topically 2 (two) times daily. Apply to dry skin especially bilateral lower extremities and arms. 240 mL 0   empagliflozin (JARDIANCE) 10 MG TABS tablet Take 1 tablet (10 mg total) by mouth daily before breakfast. 30 tablet 0   ferrous sulfate 325 (65 FE) MG tablet Take 325 mg by mouth every other day.     furosemide (LASIX) 20 MG tablet Take 20 mg by mouth daily.     gabapentin (NEURONTIN) 300 MG capsule Take 300 mg by mouth 3 (three) times daily.     HYDROcodone-acetaminophen (NORCO/VICODIN) 5-325 MG tablet Take 1 tablet by mouth 3 (three) times daily as needed.     Incontinence Supply Disposable (BLADDER CONTROL PADS EX ABSORB) MISC Apply 1 Dose topically as needed.      ipratropium-albuterol (DUONEB) 0.5-2.5 (3) MG/3ML SOLN Take by nebulization.     isosorbide mononitrate (IMDUR) 60 MG 24 hr tablet Take 1 tablet (60 mg total) by mouth 2 (two) times daily. 60 tablet 0   loratadine (CLARITIN) 10 MG tablet Take 10 mg by mouth daily.     losartan (COZAAR) 25 MG tablet Take 25 mg by mouth  daily. (Patient not taking: Reported on 01/26/2023)     losartan (COZAAR) 50 MG tablet Take 50 mg by mouth daily.     omeprazole (PRILOSEC)  40 MG capsule Take 40 mg by mouth daily.     oxybutynin (DITROPAN) 5 MG tablet Take 5 mg by mouth 2 (two) times daily.     PAXLOVID, 300/100, 20 x 150 MG & 10 x 100MG  TBPK Take by mouth. (Patient not taking: Reported on 01/26/2023)     polyethylene glycol (MIRALAX / GLYCOLAX) 17 g packet Take 17 g by mouth daily. Skip the dose if no constipation 14 each 0   traMADol HCl 25 MG TABS Take 1 tablet by mouth every 6 (six) hours as needed.     vitamin B-12 (CYANOCOBALAMIN) 500 MCG tablet Take 500 mcg by mouth daily.     No current facility-administered medications for this visit.     PHYSICAL EXAM:  General:  Well appearing. No resp difficulty HEENT: normal Neck: supple. JVP flat. No lymphadenopathy or thryomegaly appreciated. Cor: PMI normal. Regular rate & rhythm. No rubs, gallops or murmurs. Lungs: expiratory wheezing BUL, clear in lower lobes Abdomen: soft, nontender, nondistended. No hepatosplenomegaly. No bruits or masses.  Extremities: no cyanosis, clubbing, rash, 1+ pitting edema bilateral lower legs Neuro: alert & oriented x3, cranial nerves grossly intact. Moves all 4 extremities w/o difficulty. Affect pleasant.  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  - not adding salt  - unclear of fluid intake - continue jardiance 10mg  dailiy - continue losartan 50mg  daily - continue spironolactone 25mg  daily - BMP today - does elevate her legs in the bed some during the  day and then overnight weight with some improvement - BNP 09/09/22 was 113.1  2: HTN with CKD- - BP  - sees PCP ([facility]) but is currently seeing PCP at SNF  - BMP 09/16/22 reviewed and showed sodium 138, potassium 3.6, creatinine 0.93 and GFR >60  3: OSA- - last sleep study was done 5 years ago and she's needing new supplies so that she can start wearing it  4: Breast cancer- - saw oncology [name]) 06/24  5: Lymphedema- - stage 2 - encouraged her to elevate her legs when sitting for long periods of time - went to wound center 03/24 - limited in her ability to exercise

## 2023-03-04 ENCOUNTER — Telehealth: Payer: Self-pay

## 2023-03-04 ENCOUNTER — Encounter: Payer: Medicare Other | Admitting: Family

## 2023-03-05 ENCOUNTER — Ambulatory Visit: Payer: Medicare Other

## 2023-03-06 ENCOUNTER — Ambulatory Visit: Payer: Medicare Other | Attending: Family | Admitting: Family

## 2023-03-06 ENCOUNTER — Encounter: Payer: Medicare Other | Admitting: Family

## 2023-03-06 ENCOUNTER — Encounter: Payer: Self-pay | Admitting: Family

## 2023-03-06 VITALS — BP 150/74 | HR 69 | Wt 249.0 lb

## 2023-03-06 DIAGNOSIS — C50911 Malignant neoplasm of unspecified site of right female breast: Secondary | ICD-10-CM | POA: Diagnosis not present

## 2023-03-06 DIAGNOSIS — E1122 Type 2 diabetes mellitus with diabetic chronic kidney disease: Secondary | ICD-10-CM | POA: Insufficient documentation

## 2023-03-06 DIAGNOSIS — Z170 Estrogen receptor positive status [ER+]: Secondary | ICD-10-CM

## 2023-03-06 DIAGNOSIS — Z8249 Family history of ischemic heart disease and other diseases of the circulatory system: Secondary | ICD-10-CM | POA: Insufficient documentation

## 2023-03-06 DIAGNOSIS — G4733 Obstructive sleep apnea (adult) (pediatric): Secondary | ICD-10-CM | POA: Insufficient documentation

## 2023-03-06 DIAGNOSIS — Z87891 Personal history of nicotine dependence: Secondary | ICD-10-CM | POA: Insufficient documentation

## 2023-03-06 DIAGNOSIS — E1151 Type 2 diabetes mellitus with diabetic peripheral angiopathy without gangrene: Secondary | ICD-10-CM | POA: Insufficient documentation

## 2023-03-06 DIAGNOSIS — E1140 Type 2 diabetes mellitus with diabetic neuropathy, unspecified: Secondary | ICD-10-CM | POA: Diagnosis not present

## 2023-03-06 DIAGNOSIS — Z7984 Long term (current) use of oral hypoglycemic drugs: Secondary | ICD-10-CM | POA: Diagnosis not present

## 2023-03-06 DIAGNOSIS — I10 Essential (primary) hypertension: Secondary | ICD-10-CM | POA: Diagnosis not present

## 2023-03-06 DIAGNOSIS — N189 Chronic kidney disease, unspecified: Secondary | ICD-10-CM | POA: Insufficient documentation

## 2023-03-06 DIAGNOSIS — I130 Hypertensive heart and chronic kidney disease with heart failure and stage 1 through stage 4 chronic kidney disease, or unspecified chronic kidney disease: Secondary | ICD-10-CM | POA: Insufficient documentation

## 2023-03-06 DIAGNOSIS — Z803 Family history of malignant neoplasm of breast: Secondary | ICD-10-CM | POA: Diagnosis not present

## 2023-03-06 DIAGNOSIS — I5032 Chronic diastolic (congestive) heart failure: Secondary | ICD-10-CM | POA: Insufficient documentation

## 2023-03-06 DIAGNOSIS — Z853 Personal history of malignant neoplasm of breast: Secondary | ICD-10-CM | POA: Diagnosis not present

## 2023-03-06 DIAGNOSIS — I428 Other cardiomyopathies: Secondary | ICD-10-CM | POA: Insufficient documentation

## 2023-03-06 DIAGNOSIS — Z79899 Other long term (current) drug therapy: Secondary | ICD-10-CM | POA: Diagnosis not present

## 2023-03-06 DIAGNOSIS — E1142 Type 2 diabetes mellitus with diabetic polyneuropathy: Secondary | ICD-10-CM

## 2023-03-06 DIAGNOSIS — I890 Lymphedema, not elsewhere classified: Secondary | ICD-10-CM | POA: Insufficient documentation

## 2023-03-06 NOTE — Progress Notes (Signed)
PCP: @ [facility] Cardiologist: none  HPI:  Ms Brunot is a 77 y/o female with a history of breast cancer, CKD, cellulitis, anemia, osteoarthritis, DM, PVD, HTN, GERD, sleep apnea, previous tobacco use and chronic heart failure.   Echo 03/04/22: EF of 50-55% along with mild LVH/ LAE and mild MR.   Admitted 09/10/22 due to lower extremity cellulitis. Was in the ED 08/22/22 due to near syncope.   She presents today for a follow-up visit with a chief complaint of moderate fatigue with minimal exertion. Chronic in nature. Has associated shortness of breat, intermittent palpitations, intermittent headache, pedal edema and chronic difficulty sleeping along with this. Having tingling and burning on bilateral feet due to neuropathy causing a lot of leg pain and says that the gabapentin doesn't help at all. Denies chest pain, abdominal distention or dizziness.   Is being weighed daily at [facility] and weight chart was reviewed. Not adding salt but says that she doesn't eat much because of the food. She has no teeth in so her food is ground up and doesn't taste good to her. She will sometimes eat spam or potted meat that she has in her room.   ROS: All systems negative except as listed in HPI, PMH and Problem List.  SH:  Social History   Socioeconomic History   Marital status: Divorced    Spouse name: Not on file   Number of children: Not on file   Years of education: Not on file   Highest education level: Not on file  Occupational History   Occupation: retired  Tobacco Use   Smoking status: Former    Packs/day: 0.50    Years: 30.00    Additional pack years: 0.00    Total pack years: 15.00    Types: Cigarettes    Quit date: 07/14/2000    Years since quitting: 22.6   Smokeless tobacco: Never  Vaping Use   Vaping Use: Never used  Substance and Sexual Activity   Alcohol use: No   Drug use: Not Currently    Types: Marijuana    Comment: endorses prior marijuana use 1999    Sexual activity: Never  Other Topics Concern   Not on file  Social History Narrative   Not on file   Social Determinants of Health   Financial Resource Strain: Not on file  Food Insecurity: No Food Insecurity (09/11/2022)   Hunger Vital Sign    Worried About Running Out of Food in the Last Year: Never true    Ran Out of Food in the Last Year: Never true  Transportation Needs: No Transportation Needs (09/11/2022)   PRAPARE - Administrator, Civil Service (Medical): No    Lack of Transportation (Non-Medical): No  Physical Activity: Not on file  Stress: Not on file  Social Connections: Not on file  Intimate Partner Violence: Not At Risk (09/11/2022)   Humiliation, Afraid, Rape, and Kick questionnaire    Fear of Current or Ex-Partner: No    Emotionally Abused: No    Physically Abused: No    Sexually Abused: No    FH:  Family History  Problem Relation Age of Onset   Hypertension Mother        deceased 7   Cataracts Mother    Breast cancer Other 58       maternal half-sister; deceased 70   Coronary artery disease Other     Past Medical History:  Diagnosis Date   Abnormality of gait  Acute respiratory failure with hypoxia (HCC) 06/13/2022   Breast cancer (HCC) 02/05/2005   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    Breast cancer (HCC) 2019   right breast   CHF (congestive heart failure) (HCC)    March 28, 2017 echo: EF 50-55%; 1) echo 07/2011: EF 20-25%, mild concentric hypertrophy, diffuse HK, regional WMA cannot be excluded, grade 1 DD, mitral valve with mild regurg, LA mildly dilated). 2) EF 40-45%, mild AS,                     Chronic kidney disease    Complication of anesthesia 2006   pt states "hard to wake up" after L breast surgery    Edema    GERD (gastroesophageal reflux disease)    Headache    Heart murmur    Hypertension    Hypokinesis    global, EF 35-45 % from  Echo.   Malignant neoplasm of breast (female), unspecified site 06/14/2018   RIGHT T1b, N0; ER/PR positive, HER-2 negative.   Neuropathy    Obesity    Pain in joint, site unspecified    Personal history of chemotherapy 2006   left breast   Personal history of radiation therapy 2019   right breast   Personal history of radiation therapy 2006   left breast   Pneumonia    Sleep apnea    Tinea pedis    Unspecified hearing loss    bilateral    Current Outpatient Medications  Medication Sig Dispense Refill   acetaminophen (TYLENOL) 325 MG tablet Take 2 tablets (650 mg total) by mouth every 6 (six) hours as needed for mild pain (or Fever >/= 101).     anastrozole (ARIMIDEX) 1 MG tablet Take 1 tablet (1 mg total) by mouth daily. 30 tablet 6   ascorbic acid (VITAMIN C) 500 MG tablet Take 1 tablet by mouth 2 (two) times daily.     aspirin EC 81 MG tablet Take 81 mg by mouth daily.     bisacodyl (DULCOLAX) 5 MG EC tablet Take 2 tablets (10 mg total) by mouth at bedtime. Skip the dose if no constipation 30 tablet 0   cloNIDine (CATAPRES) 0.1 MG tablet Take 1 tablet (0.1 mg total) by mouth 2 (two) times daily as needed (systolic BP > 150 or diastolic BP > 95). 60 tablet 0   [employer] CALCIUM + D3 600-20 MG-MCG TABS TAKE 1 TABLET BY MOUTH TWICE A DAY 120 tablet 1   diclofenac Sodium (VOLTAREN) 1 % GEL Apply 2 g topically 4 (four) times daily.     Emollient (EUCERIN) lotion Apply topically 2 (two) times daily. Apply to dry skin especially bilateral lower extremities and arms. 240 mL 0   empagliflozin (JARDIANCE) 10 MG TABS tablet Take 1 tablet (10 mg total) by mouth daily before breakfast. 30 tablet 0   ferrous sulfate 325 (65 FE) MG tablet Take 325 mg by mouth every other day.     gabapentin (NEURONTIN) 300 MG capsule Take 300 mg by mouth 3 (three) times daily.     HYDROcodone-acetaminophen (NORCO/VICODIN) 5-325 MG tablet Take 1 tablet by mouth 3 (three) times daily as needed.     ipratropium-albuterol  (DUONEB) 0.5-2.5 (3) MG/3ML SOLN Take by nebulization.     isosorbide mononitrate (IMDUR) 60 MG 24 hr tablet Take 1 tablet (60 mg total) by mouth 2 (two) times daily. 60 tablet 0   loratadine (  CLARITIN) 10 MG tablet Take 10 mg by mouth daily.     losartan (COZAAR) 50 MG tablet Take 50 mg by mouth daily.     omeprazole (PRILOSEC) 40 MG capsule Take 40 mg by mouth daily.     oxybutynin (DITROPAN) 5 MG tablet Take 5 mg by mouth 2 (two) times daily.     polyethylene glycol (MIRALAX / GLYCOLAX) 17 g packet Take 17 g by mouth daily. Skip the dose if no constipation 14 each 0   traMADol HCl 25 MG TABS Take 1 tablet by mouth every 6 (six) hours as needed.     vitamin B-12 (CYANOCOBALAMIN) 500 MCG tablet Take 500 mcg by mouth daily.     cloNIDine HCl (KAPVAY) 0.1 MG TB12 ER tablet Take 0.1 mg by mouth 2 (two) times daily. PRN     furosemide (LASIX) 20 MG tablet Take 20 mg by mouth daily. (Patient not taking: Reported on 03/06/2023)     Incontinence Supply Disposable (BLADDER CONTROL PADS EX ABSORB) MISC Apply 1 Dose topically as needed.      losartan (COZAAR) 25 MG tablet Take 25 mg by mouth daily. (Patient not taking: Reported on 03/06/2023)     PAXLOVID, 300/100, 20 x 150 MG & 10 x 100MG  TBPK Take by mouth. (Patient not taking: Reported on 01/26/2023)     No current facility-administered medications for this visit.   Vitals:   03/06/23 1436  BP: (!) 150/74  Pulse: 69  SpO2: 99%  Weight: 249 lb (112.9 kg)   Wt Readings from Last 3 Encounters:  03/06/23 249 lb (112.9 kg)  02/23/23 248 lb (112.5 kg)  01/26/23 242 lb (109.8 kg)   Lab Results  Component Value Date   CREATININE 0.93 09/16/2022   CREATININE 0.96 09/15/2022   CREATININE 0.92 09/14/2022   PHYSICAL EXAM:  General:  Well appearing. No resp difficulty HEENT: normal Neck: supple. JVP flat. No lymphadenopathy or thryomegaly appreciated. Cor: PMI normal. Regular rate & rhythm. No rubs, gallops or murmurs. Lungs: clear  throughout Abdomen: soft, nontender, nondistended. No hepatosplenomegaly. No bruits or masses.  Extremities: no cyanosis, clubbing, rash, 1+ pitting edema bilateral lower legs Neuro: alert & oriented x3, cranial nerves grossly intact. Moves all 4 extremities w/o difficulty. Affect pleasant.  ECG: SB with incomplete LBBB and LVH  ASSESSMENT & PLAN:  1: NICM with preserved ejection fraction- - likely due to HTN & untreated OSA - NYHA class III - euvolemic today - last weight at SNF 2 days ago was 249 pounds; call for an overnight weight gain of > 2 pounds or a weekly weight gain of > 5 pounds - facility weight chart reviewed and she's getting weighed daily with a range of 247-250 pounds for the month of June - not weighed in clinic today due to instability and patient request - Echo 03/04/22: EF of 50-55% along with mild LVH/ LAE and mild MR.  - not adding salt  - unclear of fluid intake - continue jardiance 10mg  dailiy - continue losartan 50mg  daily - continue spironolactone 25mg  daily - BMP to be drawn at facility and results faxed to [place] - does elevate her legs in the bed some during the day and then overnight weight with some improvement of edema - referral made to [facility] for cardiology f/u - BNP 09/09/22 was 113.1  2: HTN with CKD- - BP 150/74 - sees PCP (Aycock) but is currently seeing PCP at SNF  - BMP 09/16/22 reviewed and showed sodium 138, potassium  3.6, creatinine 0.93 and GFR >60 - order written for BMP to be drawn today and results faxed to [place]  3: OSA- - last sleep study was done 5 years ago and she's needing new supplies so that she can start wearing it  4: Breast cancer- - saw oncology Freida Busman) 06/24  5: Lymphedema- - stage 2 - encouraged her to elevate her legs when sitting for long periods of time - went to wound center 03/24 - limited in her ability to exercise  6: DM2- - glucose yesterday was 131 - reports neuropathy pain in bilateral feet which interferes with  her ability to sleep - order written for SNF provider to try and address her neuropathy as she says that the gabapentin doesn't help  Return here PRN.

## 2023-03-06 NOTE — Patient Instructions (Signed)
Call [facility] cardiology to get an appt scheduled. [phone]  Call us in the future if you need to make another appointment

## 2023-03-19 NOTE — Telephone Encounter (Signed)
Called and spoke with Misty Stanley. She transferred me to [facility] with medical records. No answer. LVM requesting lab (BMP) from 5/29 be faxed over to [place]. Fax number and phone number was given on VM.

## 2023-03-25 ENCOUNTER — Ambulatory Visit: Payer: Medicare Other | Admitting: Physician Assistant

## 2023-03-26 ENCOUNTER — Ambulatory Visit: Payer: Medicare Other | Admitting: Physician Assistant

## 2023-03-29 ENCOUNTER — Emergency Department: Payer: Medicare Other

## 2023-03-29 ENCOUNTER — Inpatient Hospital Stay
Admission: EM | Admit: 2023-03-29 | Discharge: 2023-03-31 | DRG: 054 | Disposition: A | Discharge status: Skilled Nursing Facility | Payer: Medicare Other | Source: Skilled Nursing Facility | Attending: Internal Medicine | Admitting: Internal Medicine

## 2023-03-29 ENCOUNTER — Other Ambulatory Visit: Payer: Self-pay

## 2023-03-29 DIAGNOSIS — Z87891 Personal history of nicotine dependence: Secondary | ICD-10-CM

## 2023-03-29 DIAGNOSIS — Z1152 Encounter for screening for COVID-19: Secondary | ICD-10-CM

## 2023-03-29 DIAGNOSIS — I5022 Chronic systolic (congestive) heart failure: Secondary | ICD-10-CM | POA: Diagnosis present

## 2023-03-29 DIAGNOSIS — C50919 Malignant neoplasm of unspecified site of unspecified female breast: Secondary | ICD-10-CM | POA: Diagnosis present

## 2023-03-29 DIAGNOSIS — Z79811 Long term (current) use of aromatase inhibitors: Secondary | ICD-10-CM

## 2023-03-29 DIAGNOSIS — R4781 Slurred speech: Secondary | ICD-10-CM | POA: Diagnosis present

## 2023-03-29 DIAGNOSIS — I440 Atrioventricular block, first degree: Secondary | ICD-10-CM | POA: Diagnosis present

## 2023-03-29 DIAGNOSIS — H9190 Unspecified hearing loss, unspecified ear: Secondary | ICD-10-CM | POA: Diagnosis present

## 2023-03-29 DIAGNOSIS — Z170 Estrogen receptor positive status [ER+]: Secondary | ICD-10-CM

## 2023-03-29 DIAGNOSIS — K219 Gastro-esophageal reflux disease without esophagitis: Secondary | ICD-10-CM | POA: Diagnosis present

## 2023-03-29 DIAGNOSIS — Z86718 Personal history of other venous thrombosis and embolism: Secondary | ICD-10-CM

## 2023-03-29 DIAGNOSIS — G9389 Other specified disorders of brain: Principal | ICD-10-CM

## 2023-03-29 DIAGNOSIS — N1832 Chronic kidney disease, stage 3b: Secondary | ICD-10-CM | POA: Diagnosis present

## 2023-03-29 DIAGNOSIS — Z9011 Acquired absence of right breast and nipple: Secondary | ICD-10-CM

## 2023-03-29 DIAGNOSIS — Z171 Estrogen receptor negative status [ER-]: Secondary | ICD-10-CM

## 2023-03-29 DIAGNOSIS — I130 Hypertensive heart and chronic kidney disease with heart failure and stage 1 through stage 4 chronic kidney disease, or unspecified chronic kidney disease: Secondary | ICD-10-CM | POA: Diagnosis present

## 2023-03-29 DIAGNOSIS — R471 Dysarthria and anarthria: Secondary | ICD-10-CM | POA: Diagnosis present

## 2023-03-29 DIAGNOSIS — E669 Obesity, unspecified: Secondary | ICD-10-CM | POA: Diagnosis present

## 2023-03-29 DIAGNOSIS — Z7984 Long term (current) use of oral hypoglycemic drugs: Secondary | ICD-10-CM

## 2023-03-29 DIAGNOSIS — D496 Neoplasm of unspecified behavior of brain: Secondary | ICD-10-CM | POA: Diagnosis present

## 2023-03-29 DIAGNOSIS — I739 Peripheral vascular disease, unspecified: Secondary | ICD-10-CM | POA: Diagnosis present

## 2023-03-29 DIAGNOSIS — Z7982 Long term (current) use of aspirin: Secondary | ICD-10-CM

## 2023-03-29 DIAGNOSIS — I5032 Chronic diastolic (congestive) heart failure: Secondary | ICD-10-CM | POA: Diagnosis present

## 2023-03-29 DIAGNOSIS — Z6836 Body mass index (BMI) 36.0-36.9, adult: Secondary | ICD-10-CM

## 2023-03-29 DIAGNOSIS — G936 Cerebral edema: Secondary | ICD-10-CM | POA: Diagnosis present

## 2023-03-29 DIAGNOSIS — C50911 Malignant neoplasm of unspecified site of right female breast: Secondary | ICD-10-CM | POA: Diagnosis present

## 2023-03-29 DIAGNOSIS — L97919 Non-pressure chronic ulcer of unspecified part of right lower leg with unspecified severity: Secondary | ICD-10-CM | POA: Diagnosis present

## 2023-03-29 DIAGNOSIS — R54 Age-related physical debility: Secondary | ICD-10-CM | POA: Diagnosis present

## 2023-03-29 DIAGNOSIS — R29810 Facial weakness: Secondary | ICD-10-CM | POA: Diagnosis present

## 2023-03-29 DIAGNOSIS — Z9221 Personal history of antineoplastic chemotherapy: Secondary | ICD-10-CM

## 2023-03-29 DIAGNOSIS — D638 Anemia in other chronic diseases classified elsewhere: Secondary | ICD-10-CM | POA: Diagnosis present

## 2023-03-29 DIAGNOSIS — D631 Anemia in chronic kidney disease: Secondary | ICD-10-CM | POA: Diagnosis present

## 2023-03-29 DIAGNOSIS — Z8249 Family history of ischemic heart disease and other diseases of the circulatory system: Secondary | ICD-10-CM

## 2023-03-29 DIAGNOSIS — C7931 Secondary malignant neoplasm of brain: Secondary | ICD-10-CM | POA: Diagnosis not present

## 2023-03-29 DIAGNOSIS — N1831 Chronic kidney disease, stage 3a: Secondary | ICD-10-CM | POA: Diagnosis present

## 2023-03-29 DIAGNOSIS — Z923 Personal history of irradiation: Secondary | ICD-10-CM

## 2023-03-29 DIAGNOSIS — I82441 Acute embolism and thrombosis of right tibial vein: Secondary | ICD-10-CM | POA: Diagnosis present

## 2023-03-29 DIAGNOSIS — I10 Essential (primary) hypertension: Secondary | ICD-10-CM | POA: Diagnosis present

## 2023-03-29 DIAGNOSIS — Z79899 Other long term (current) drug therapy: Secondary | ICD-10-CM

## 2023-03-29 DIAGNOSIS — Z803 Family history of malignant neoplasm of breast: Secondary | ICD-10-CM

## 2023-03-29 LAB — CBC
HCT: 36.5 % (ref 36.0–46.0)
Hemoglobin: 11.5 g/dL — ABNORMAL LOW (ref 12.0–15.0)
MCH: 26.6 pg (ref 26.0–34.0)
MCHC: 31.5 g/dL (ref 30.0–36.0)
MCV: 84.5 fL (ref 80.0–100.0)
Platelets: 261 10*3/uL (ref 150–400)
RBC: 4.32 MIL/uL (ref 3.87–5.11)
RDW: 16.5 % — ABNORMAL HIGH (ref 11.5–15.5)
WBC: 6.7 10*3/uL (ref 4.0–10.5)
nRBC: 0 % (ref 0.0–0.2)

## 2023-03-29 LAB — BASIC METABOLIC PANEL
Anion gap: 6 (ref 5–15)
BUN: 27 mg/dL — ABNORMAL HIGH (ref 8–23)
CO2: 29 mmol/L (ref 22–32)
Calcium: 9.1 mg/dL (ref 8.9–10.3)
Chloride: 105 mmol/L (ref 98–111)
Creatinine, Ser: 0.88 mg/dL (ref 0.44–1.00)
GFR, Estimated: >60 mL/min (ref >60)
Glucose, Bld: 97 mg/dL (ref 70–99)
Potassium: 4.2 mmol/L (ref 3.5–5.1)
Sodium: 140 mmol/L (ref 135–145)

## 2023-03-29 LAB — SARS CORONAVIRUS 2 BY RT PCR: SARS Coronavirus 2 by RT PCR: NEGATIVE

## 2023-03-29 LAB — BRAIN NATRIURETIC PEPTIDE: B Natriuretic Peptide: 212.9 pg/mL — ABNORMAL HIGH (ref 0.0–100.0)

## 2023-03-29 LAB — CBG MONITORING, ED: Glucose-Capillary: 71 mg/dL (ref 70–99)

## 2023-03-29 LAB — TROPONIN I (HIGH SENSITIVITY): Troponin I (High Sensitivity): 12 ng/L (ref <18)

## 2023-03-29 MED ORDER — LOSARTAN POTASSIUM 50 MG PO TABS
50.0000 mg | ORAL_TABLET | Freq: Every day | ORAL | Status: DC
  Administered 2023-03-30 – 2023-03-31 (×2): 50 mg via ORAL
  Filled 2023-03-29 (×3): qty 1

## 2023-03-29 MED ORDER — ACETAMINOPHEN 325 MG PO TABS: 650.0000 mg | ORAL_TABLET | Freq: Four times a day (QID) | ORAL | Status: DC | PRN

## 2023-03-29 MED ORDER — DEXAMETHASONE SODIUM PHOSPHATE 4 MG/ML IJ SOLN
4.0000 mg | Freq: Four times a day (QID) | INTRAMUSCULAR | Status: DC
  Administered 2023-03-30 – 2023-03-31 (×5): 4 mg via INTRAVENOUS
  Filled 2023-03-29 (×6): qty 1

## 2023-03-29 MED ORDER — GADOBUTROL 1 MMOL/ML IV SOLN
10.0000 mL | Freq: Once | INTRAVENOUS | Status: AC | PRN
  Administered 2023-03-29: 10 mL via INTRAVENOUS

## 2023-03-29 MED ORDER — DEXAMETHASONE SODIUM PHOSPHATE 4 MG/ML IJ SOLN
4.0000 mg | Freq: Four times a day (QID) | INTRAMUSCULAR | Status: DC
  Administered 2023-03-29: 4 mg via INTRAVENOUS
  Filled 2023-03-29: qty 1

## 2023-03-29 MED ORDER — SODIUM CHLORIDE 0.9% FLUSH
3.0000 mL | Freq: Two times a day (BID) | INTRAVENOUS | Status: DC
  Administered 2023-03-31: 3 mL via INTRAVENOUS

## 2023-03-29 MED ORDER — ISOSORBIDE MONONITRATE ER 60 MG PO TB24
60.0000 mg | ORAL_TABLET | Freq: Two times a day (BID) | ORAL | Status: DC
  Administered 2023-03-29 – 2023-03-31 (×4): 60 mg via ORAL
  Filled 2023-03-29 (×6): qty 1

## 2023-03-29 MED ORDER — OXYBUTYNIN CHLORIDE 5 MG PO TABS
5.0000 mg | ORAL_TABLET | Freq: Two times a day (BID) | ORAL | Status: DC
  Administered 2023-03-29 – 2023-03-31 (×4): 5 mg via ORAL
  Filled 2023-03-29 (×4): qty 1

## 2023-03-29 MED ORDER — SODIUM CHLORIDE 0.9 % IV SOLN: INTRAVENOUS | Status: AC

## 2023-03-29 MED ORDER — HYDROCODONE-ACETAMINOPHEN 5-325 MG PO TABS: 1.0000 | ORAL_TABLET | Freq: Three times a day (TID) | ORAL | Status: DC | PRN

## 2023-03-29 MED ORDER — ANASTROZOLE 1 MG PO TABS
1.0000 mg | ORAL_TABLET | Freq: Every day | ORAL | Status: DC
  Administered 2023-03-30 – 2023-03-31 (×2): 1 mg via ORAL
  Filled 2023-03-29 (×2): qty 1

## 2023-03-29 MED ORDER — MORPHINE SULFATE (PF) 2 MG/ML IV SOLN
2.0000 mg | INTRAVENOUS | Status: DC | PRN
  Administered 2023-03-31: 2 mg via INTRAVENOUS
  Filled 2023-03-29: qty 1

## 2023-03-29 MED ORDER — ACETAMINOPHEN 650 MG RE SUPP: 650.0000 mg | Freq: Four times a day (QID) | RECTAL | Status: DC | PRN

## 2023-03-29 MED ORDER — DEXAMETHASONE SODIUM PHOSPHATE 10 MG/ML IJ SOLN
10.0000 mg | Freq: Once | INTRAMUSCULAR | Status: DC
  Filled 2023-03-29: qty 1

## 2023-03-29 MED ORDER — IPRATROPIUM-ALBUTEROL 0.5-2.5 (3) MG/3ML IN SOLN: 3.0000 mL | Freq: Four times a day (QID) | RESPIRATORY_TRACT | Status: DC | PRN

## 2023-03-29 MED ORDER — LEVETIRACETAM IN NACL 500 MG/100ML IV SOLN
500.0000 mg | Freq: Two times a day (BID) | INTRAVENOUS | Status: DC
  Administered 2023-03-29 – 2023-03-31 (×3): 500 mg via INTRAVENOUS
  Filled 2023-03-29 (×5): qty 100

## 2023-03-29 NOTE — H&P (Incomplete)
History and Physical    Patient: [name] MVH:[id number] DOB: 1946/03/24 DOA: 03/29/2023 DOS: the patient was seen and examined on 03/30/2023 PCP: Emogene [name], MD  Patient coming from: [facility]   Chief Complaint:  Chief Complaint  Patient presents with   Weakness   Fatigue    HPI: [name] is a 77 y.o. female with medical history significant for history of breast cancer, heart failure, chronic kidney disease presenting with generalized weakness and lethargy since early morning today patient denied any pain and just said that she was very weak all over.  States that her left leg is also very weak and she has not walked very well in a while.  Per report patient feels that she feels difficulty speaking but other than that no headaches currently vision speech or falls.  No chest pain shortness of breath palpitations nausea vomiting diarrhea. In the emergency room patient is afebrile O2 sats of 96% with a heart rate of 69. EKG shows sinus rhythm with a first-degree AV block with a heart rate of 63 and QTc of 452 along with changes of LVH.  MRI of the brain was done in the emergency room which showed an enhancing mass 13 mm with surrounding vasogenic edema and concerning for metastatic disease.  Suspected from breast cancer.  Case discussed by ED provider with neurosurgeon on-call who recommended Keppra and Decadron and will consult in the morning. Blood work shows normal BMP, BNP of 212.9, initial troponin of 12, LFTs ordered and pending to be added on.  CBC shows anemia at 11.5 RDW 16.5 normal platelets and white count.  Anemia seems to be chronic. Pt has h/o DVT and repeat leg usg shows right  post tibial vein DVT.   Review of Systems: Review of Systems  Neurological:  Positive for weakness.       Confusion     Past Medical History:  Diagnosis Date   Abnormality of gait    Acute on chronic diastolic CHF (congestive heart failure) (HCC) 11/20/2017   Acute respiratory  failure with hypoxia (HCC) 06/13/2022   AKI (acute kidney injury) (HCC) 03/03/2022   Breast cancer (HCC) 02/05/2005   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    Breast cancer (HCC) 2019   right breast   CHF (congestive heart failure) (HCC)    March 28, 2017 echo: EF 50-55%; 1) echo 07/2011: EF 20-25%, mild concentric hypertrophy, diffuse HK, regional WMA cannot be excluded, grade 1 DD, mitral valve with mild regurg, LA mildly dilated). 2) EF 40-45%, mild AS,                     Chronic kidney disease    Chronic kidney disease, stage 3b (HCC) 09/10/2022   Complication of anesthesia 2006   pt states "hard to wake up" after L breast surgery    Edema    GERD (gastroesophageal reflux disease)    Glucose intolerance 09/10/2022   Headache    Heart murmur    Hypertension    Hypertensive emergency 06/13/2022   Hypokinesis    global, EF 35-45 % from Echo.   Lower limb ulcer, calf, left, limited to breakdown of skin (HCC) 12/11/2020   Malignant neoplasm of breast (female), unspecified site 06/14/2018   RIGHT T1b, N0; ER/PR positive, HER-2 negative.   Neuropathy    Obesity    Pain in joint, site unspecified    Personal history of  chemotherapy 2006   left breast   Personal history of radiation therapy 2019   right breast   Personal history of radiation therapy 2006   left breast   Pneumonia    Pyuria 08/22/2022   Sleep apnea    Syncope 03/03/2022   Tinea pedis    Unspecified hearing loss    bilateral   Weakness 03/03/2022   Past Surgical History:  Procedure Laterality Date   BREAST BIOPSY Right 2019   11 oc-INVASIVE MAMMARY CARCINOMA, NO SPECIAL TYPE   BREAST BIOPSY Right 2019   1030 oc- neg   BREAST LUMPECTOMY  2006   left breast    CATARACT EXTRACTION     left eye   HERNIA REPAIR  05/25/2006   Incarcerated omentum in ventral hernia, Ventralight mesh, combined lap/ open with omental resection.     PARTIAL MASTECTOMY WITH NEEDLE LOCALIZATION Right 06/14/2018   Procedure: PARTIAL MASTECTOMY WITH NEEDLE LOCALIZATION;  Surgeon: Earline Mayotte, MD;  Location: [facility] ORS;  Service: General;  Laterality: Right;   PORT-A-CATH REMOVAL  05/25/2006   SENTINEL NODE BIOPSY Right 06/14/2018   Procedure: SENTINEL NODE BIOPSY;  Surgeon: Earline Mayotte, MD;  Location: [facility] OR[facility];  Service: General;  Laterality: Right;   TUBAL LIGATION     Social History:  reports that she quit smoking about 22 years ago. Her smoking use included cigarettes. She started smoking about 52 years ago. She has a 15 pack-year smoking history. She has never used smokeless tobacco. She reports that she does not currently use drugs after having used the following drugs: Marijuana. She reports that she does not drink alcohol.  No Known Allergies  Family History  Problem Relation Age of Onset   Hypertension Mother        deceased 48   Cataracts Mother    Breast cancer Other 28       maternal half-sister; deceased 62   Coronary artery disease Other     Prior to Admission medications   Medication Sig Start Date End Date Taking? Authorizing Provider  acetaminophen (TYLENOL) 325 MG tablet Take 2 tablets (650 mg total) by mouth every 6 (six) hours as needed for mild pain (or Fever >/= 101). 09/15/22   Gillis Santa, MD  anastrozole (ARIMIDEX) 1 MG tablet Take 1 tablet (1 mg total) by mouth daily. 01/19/23   Creig Hines, MD  ascorbic acid (VITAMIN C) 500 MG tablet Take 1 tablet by mouth 2 (two) times daily.    [provider]  aspirin EC 81 MG tablet Take 81 mg by mouth daily.    [provider]  bisacodyl (DULCOLAX) 5 MG EC tablet Take 2 tablets (10 mg total) by mouth at bedtime. Skip the dose if no constipation 09/15/22   Gillis Santa, MD  cloNIDine (CATAPRES) 0.1 MG tablet Take 1 tablet (0.1 mg total) by mouth 2 (two) times daily as needed (systolic BP > 150 or diastolic BP > 95). 08/23/22   Marrion Coy, MD   cloNIDine HCl (KAPVAY) 0.1 MG TB12 ER tablet Take 0.1 mg by mouth 2 (two) times daily. PRN    [provider]  [employer] CALCIUM + D3 600-20 MG-MCG TABS TAKE 1 TABLET BY MOUTH TWICE A DAY 02/04/22   Creig Hines, MD  diclofenac Sodium (VOLTAREN) 1 % GEL Apply 2 g topically 4 (four) times daily.    [provider]  Emollient (EUCERIN) lotion Apply topically 2 (two) times daily. Apply to dry skin especially bilateral lower  extremities and arms. 09/15/22   Gillis Santa, MD  empagliflozin (JARDIANCE) 10 MG TABS tablet Take 1 tablet (10 mg total) by mouth daily before breakfast. 08/23/22   Marrion Coy, MD  ferrous sulfate 325 (65 FE) MG tablet Take 325 mg by mouth every other day. 12/16/22   [provider]  furosemide (LASIX) 20 MG tablet Take 20 mg by mouth daily. Patient not taking: Reported on 03/06/2023 12/24/22   [provider]  gabapentin (NEURONTIN) 300 MG capsule Take 300 mg by mouth 3 (three) times daily.    [provider]  HYDROcodone-acetaminophen (NORCO/VICODIN) 5-325 MG tablet Take 1 tablet by mouth 3 (three) times daily as needed.    [provider]  Incontinence Supply Disposable (BLADDER CONTROL PADS EX ABSORB) MISC Apply 1 Dose topically as needed.  03/22/18   [provider]  ipratropium-albuterol (DUONEB) 0.5-2.5 (3) MG/3ML SOLN Take by nebulization. 12/04/22   [provider]  isosorbide mononitrate (IMDUR) 60 MG 24 hr tablet Take 1 tablet (60 mg total) by mouth 2 (two) times daily. 08/23/22   Marrion Coy, MD  loratadine (CLARITIN) 10 MG tablet Take 10 mg by mouth daily. 07/20/19   [provider]  losartan (COZAAR) 50 MG tablet Take 50 mg by mouth daily.    [provider]  omeprazole (PRILOSEC) 40 MG capsule Take 40 mg by mouth daily. 04/13/21   [provider]  oxybutynin (DITROPAN) 5 MG tablet Take 5 mg by mouth 2 (two) times daily.    [provider]  polyethylene glycol (MIRALAX  / GLYCOLAX) 17 g packet Take 17 g by mouth daily. Skip the dose if no constipation 09/15/22   Gillis Santa, MD  traMADol HCl 25 MG TABS Take 1 tablet by mouth every 6 (six) hours as needed. 10/30/22   [provider]  vitamin B-12 (CYANOCOBALAMIN) 500 MCG tablet Take 500 mcg by mouth daily.    [provider]   Vitals:   03/29/23 2200 03/29/23 2345 03/30/23 0000 03/30/23 0030  BP: (!) 162/85  (!) 150/82   Pulse: 84 (!) 104 87   Resp: 15 (!) 24 16   Temp:    98.2 F (36.8 C)  TempSrc:    Oral  SpO2: 94% 93% 95%   Weight:      Height:       Physical Exam Vitals and nursing note reviewed.  Constitutional:      General: She is not in acute distress. HENT:     Head: Normocephalic and atraumatic.     Right Ear: Hearing normal.     Left Ear: Hearing normal.     Nose: Nose normal. No nasal deformity.     Mouth/Throat:     Lips: Pink.     Tongue: No lesions.     Pharynx: Oropharynx is clear.  Eyes:     General: Lids are normal.     Extraocular Movements: Extraocular movements intact.  Cardiovascular:     Rate and Rhythm: Normal rate and regular rhythm.     Heart sounds: Normal heart sounds.  Pulmonary:     Effort: Pulmonary effort is normal.     Breath sounds: Normal breath sounds.  Abdominal:     General: Bowel sounds are normal. There is no distension.     Palpations: Abdomen is soft. There is no mass.     Tenderness: There is no abdominal tenderness.  Musculoskeletal:     Right lower leg: No edema.  Left lower leg: No edema.     Comments: Weakness of both lower extremities.  H/o DVT .  Skin:    General: Skin is warm.  Neurological:     General: No focal deficit present.     Mental Status: She is alert and oriented to person, place, and time.     Cranial Nerves: Cranial nerves 2-12 are intact.     Motor: Weakness present.  Psychiatric:        Attention and Perception: Attention normal.        Mood and Affect: Mood normal.        Speech: Speech  normal.        Behavior: Behavior normal. Behavior is cooperative.     Labs on Admission: I have personally reviewed following labs and imaging studies  CBC: Recent Labs  Lab 03/29/23 1401  WBC 6.7  HGB 11.5*  HCT 36.5  MCV 84.5  PLT 261   Basic Metabolic Panel: Recent Labs  Lab 03/29/23 1401  NA 140  K 4.2  CL 105  CO2 29  GLUCOSE 97  BUN 27*  CREATININE 0.88  CALCIUM 9.1   GFR: Estimated Creatinine Clearance: 71.9 mL/min (by C-G formula based on SCr of 0.88 mg/dL). Liver Function Tests: No results for input(s): "AST", "ALT", "ALKPHOS", "BILITOT", "PROT", "ALBUMIN" in the last 168 hours. No results for input(s): "LIPASE", "AMYLASE" in the last 168 hours. No results for input(s): "AMMONIA" in the last 168 hours. Coagulation Profile: No results for input(s): "INR", "PROTIME" in the last 168 hours. Cardiac Enzymes: No results for input(s): "CKTOTAL", "CKMB", "CKMBINDEX", "TROPONINI" in the last 168 hours. BNP (last 3 results) No results for input(s): "PROBNP" in the last 8760 hours. HbA1C: No results for input(s): "HGBA1C" in the last 72 hours. CBG: Recent Labs  Lab 03/29/23 1352  GLUCAP 71   Lipid Profile: No results for input(s): "CHOL", "HDL", "LDLCALC", "TRIG", "CHOLHDL", "LDLDIRECT" in the last 72 hours. Thyroid Function Tests: No results for input(s): "TSH", "T4TOTAL", "FREET4", "T3FREE", "THYROIDAB" in the last 72 hours. Anemia Panel: No results for input(s): "VITAMINB12", "FOLATE", "FERRITIN", "TIBC", "IRON", "RETICCTPCT" in the last 72 hours. Urine analysis: Urinalysis    Component Value Date/Time   COLORURINE YELLOW (A) 08/22/2022 1540   APPEARANCEUR CLEAR (A) 08/22/2022 1540   APPEARANCEUR Clear 03/22/2014 1620   LABSPEC 1.011 08/22/2022 1540   LABSPEC 1.020 03/22/2014 1620   PHURINE 5.0 08/22/2022 1540   GLUCOSEU 50 (A) 08/22/2022 1540   GLUCOSEU Negative 03/22/2014 1620   HGBUR NEGATIVE 08/22/2022 1540   BILIRUBINUR NEGATIVE 08/22/2022  1540   BILIRUBINUR Negative 03/22/2014 1620   KETONESUR NEGATIVE 08/22/2022 1540   PROTEINUR NEGATIVE 08/22/2022 1540   NITRITE NEGATIVE 08/22/2022 1540   LEUKOCYTESUR LARGE (A) 08/22/2022 1540   LEUKOCYTESUR Negative 03/22/2014 1620    Unresulted Labs (From admission, onward)     Start     Ordered   03/30/23 0500  Comprehensive metabolic panel  Tomorrow morning,   R        03/29/23 1955   03/30/23 0500  CBC  Tomorrow morning,   R        03/29/23 1955   03/30/23 0146  Type and screen  Once,   R        03/30/23 0145   03/30/23 0146  D-dimer, quantitative  Add-on,   AD        03/30/23 0145   03/29/23 1350  Urinalysis, Routine w reflex microscopic -Urine, Clean Catch  Once,  URGENT       Question:  Specimen Source  Answer:  Urine, Clean Catch   03/29/23 1349             Radiological Exams on Admission: US Venous Img Lower Bilateral (DVT)  Result Date: 03/30/2023 CLINICAL DATA:  Bilateral lower extremity pain and swelling, history of deep venous thrombosis. EXAM: BILATERAL LOWER EXTREMITY VENOUS DOPPLER ULTRASOUND TECHNIQUE: Gray-scale sonography with graded compression, as well as color Doppler and duplex ultrasound were performed to evaluate the lower extremity deep venous systems from the level of the common femoral vein and including the common femoral, femoral, profunda femoral, popliteal and calf veins including the posterior tibial, peroneal and gastrocnemius veins when visible. The superficial great saphenous vein was also interrogated. Spectral Doppler was utilized to evaluate flow at rest and with distal augmentation maneuvers in the common femoral, femoral and popliteal veins. COMPARISON:  None Available. FINDINGS: RIGHT LOWER EXTREMITY Common Femoral Vein: No evidence of thrombus. Normal compressibility, respiratory phasicity and response to augmentation. Saphenofemoral Junction: No evidence of thrombus. Normal compressibility and flow on color Doppler imaging. Profunda  Femoral Vein: No evidence of thrombus. Normal compressibility and flow on color Doppler imaging. Femoral Vein: No evidence of thrombus. Normal compressibility, respiratory phasicity and response to augmentation. Popliteal Vein: No evidence of thrombus. Normal compressibility, respiratory phasicity and response to augmentation. Calf Veins: Thrombus is noted in the posterior tibial vein. The peroneal vein is not well visualized. Superficial Great Saphenous Vein: No evidence of thrombus. Normal compressibility. Venous Reflux:  None. Other Findings:  None. LEFT LOWER EXTREMITY Common Femoral Vein: No evidence of thrombus. Normal compressibility, respiratory phasicity and response to augmentation. Saphenofemoral Junction: No evidence of thrombus. Normal compressibility and flow on color Doppler imaging. Profunda Femoral Vein: No evidence of thrombus. Normal compressibility and flow on color Doppler imaging. Femoral Vein: No evidence of thrombus. Normal compressibility, respiratory phasicity and response to augmentation. Popliteal Vein: No evidence of thrombus. Normal compressibility, respiratory phasicity and response to augmentation. Calf Veins: Calf veins are not well visualized. Superficial Great Saphenous Vein: No evidence of thrombus. Normal compressibility. Venous Reflux:  None. Other Findings:  None. IMPRESSION: Thrombus noted in the right posterior tibial vein. No other focal abnormality is noted. Electronically Signed   By: Alcide Clever M.D.   On: 03/30/2023 01:08   MR BRAIN W WO CONTRAST  Result Date: 03/29/2023 CLINICAL DATA:  right facial droop. EXAM: MRI HEAD WITHOUT AND WITH CONTRAST TECHNIQUE: Multiplanar, multiecho pulse sequences of the brain and surrounding structures were obtained without and with intravenous contrast. CONTRAST:  10mL GADAVIST GADOBUTROL 1 MMOL/ML IV SOLN COMPARISON:  Brain MRI 03/03/2022. FINDINGS: Brain: Peripherally enhancing mass with central nonenhancement along the gray-white  junction of the right cingulate gyrus, measuring up to 13 mm (axial image 109 series 20) with surrounding vasogenic edema. No other foci of abnormal enhancement in the brain. No acute infarct or hemorrhage. Mild chronic small-vessel disease. No hydrocephalus or extra-axial collection. Vascular: Normal flow voids and vessel enhancement. Skull and upper cervical spine: Normal marrow signal and enhancement. Sinuses/Orbits: Unremarkable. Other: None. IMPRESSION: Peripherally enhancing mass with central nonenhancement along the gray-white junction of the right cingulate gyrus, measuring up to 13 mm with surrounding vasogenic edema. 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 Electronically Signed   By: [name] M.D.   On: 03/29/2023 18:11     Data Reviewed: Relevant notes from primary care and specialist visits, past discharge summaries as available in EHR, including Care Everywhere. Prior  diagnostic testing as pertinent to current admission diagnoses Updated medications and problem lists for reconciliation ED course, including vitals, labs, imaging, treatment and response to treatment Triage notes, nursing and pharmacy notes and ED provider's notes Notable results as noted in HPI Assessment and Plan: * Mouth droop due to facial weakness Pt is Alert/awake and oriented.  Resolved on my exam.    Chronic systolic CHF (congestive heart failure) (HCC) Compensated and stable.  No LE edema / SOB. Imdur  and losartan continued.    Acute DVT of right tibial vein (HCC) Not sure if this is acute pt is high risk for DVT and also has had DVT in left leg in past.  Will give single dose of heparin.   Stage 3a chronic kidney disease (HCC) Lab Results  Component Value Date   CREATININE 0.88 03/29/2023   CREATININE 0.93 09/16/2022   CREATININE 0.96 09/15/2022  Stable.  Avoid contrast.  Renally dose.     Anemia of chronic disease    Latest Ref Rng & Units 03/29/2023    2:01 PM 09/16/2022     4:39 AM 09/15/2022    4:22 AM  CBC  WBC 4.0 - 10.5 K/uL 6.7  7.9  7.8   Hemoglobin 12.0 - 15.0 g/dL 54.0  9.1  8.7   Hematocrit 36.0 - 46.0 % 36.5  29.2  27.7   Platelets 150 - 400 K/uL 261  283  245   Stable anemia. We will follow.     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  Oncology consult per am team.  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 Currently [facility] held until plan for mets is clear.  Heparin held. AM team to resume once pt is evaluated for brain mass and clot.   Benign essential HTN Vitals:   03/29/23 1348 03/29/23 1630 03/29/23 1700 03/29/23 2200  BP: (!) 142/72 131/70 133/65 (!) 162/85   03/30/23 0000  BP: (!) 150/82  Losartan and Imdur.      DVT prophylaxis:  Heparin 5000 subcut x 1.   Consults:  Neuro surgery consult.   Advance Care Planning:    Code Status: Full Code   Family Communication:  None.  Disposition Plan:  Back to previous home environment  Severity of Illness: The appropriate patient status for this patient is INPATIENT. Inpatient status is judged to be reasonable and necessary in order to provide the required intensity of service to ensure the patient's safety. The patient's presenting symptoms, physical exam findings, and initial radiographic and laboratory data in the context of their chronic comorbidities is felt to place them at high risk for further clinical deterioration. Furthermore, it is not anticipated that the patient will be medically stable for discharge from the hospital within 2 midnights of admission.   * I certify that at the point of admission it is my clinical judgment that the patient will require inpatient hospital care spanning beyond 2 midnights from the point of admission due to high intensity of service, high risk for further deterioration and high frequency of surveillance required.*  Author: Gertha Calkin, MD 03/30/2023 1:52 AM  For on call review  [email or web address].

## 2023-03-29 NOTE — Hospital Course (Addendum)
[name] is a 77 y.o. female with medical history significant for history of breast cancer, heart failure, chronic kidney disease presenting with generalized weakness and lethargy since early morning today. . Per report patient feels that she feels difficulty speaking but other than that no headaches currently vision speech or falls.   ED course.  Vitals and labs mostly stable.  D-dimer elevated 1.20 MRI brain with peripherally enhancing mass with central nonenhancement along with grade-white junction at the right cingulate gyrus, with surrounding vasogenic edema.  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 CT chest, abdomen and pelvis with no acute abnormality.  Noted to have indeterminate 2 cm lesion from the lateral mid to lower pole of right kidney, and nonemergent MRI was suggested Lower extremity venous Doppler was positive for a thrombus in right posterior tibial vein.  Neurosurgery was consulted and she was started on Decadron and Keppra.  7/22: Vitals with mildly elevated blood pressure.  Vascular surgery was consulted for IVC filter placement as there was concern of high risk for intracranial bleeding with this new diagnosis of mass.  Discussed with neurosurgery and oncology, patient is not a candidate for biopsy due to the location of brain lesion.  Neurosurgery and oncology is okay to start her on anticoagulation. They are recommending discharge on Decadron and Keppra with a close outpatient follow-up with oncology and radiation oncology.  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 In August 2019 she was diagnosed with stage I right breast cancer which was ER/PR positive.  Patient was on Arimidex which she was not taking it for approximately 5 months while at facility. Recent mammogram was without any significant abnormality.  7/23: Vitals stable  with mildly elevated blood pressure.  Still appears little confused but able to answer questions appropriately.  Slow in response.  She she is being started on Eliquis and will continue on p.o. Decadron and Keppra as advised by neurosurgery due to recent diagnosis of brain mass causing some vasogenic edema.  IVC filter was placed by vascular surgery yesterday.  Based on her risk for thromboembolism due to active malignancy, and after discussing with oncology and neurosurgery she is being started on Eliquis.  Patient need to follow-up closely with oncology and radiation oncology within next few days.  They are aware and arranging appointments.  Unfortunately her mass is not amenable for any surgical intervention or biopsy due to a central location which can cause more harm than benefit per neurosurgery.  Patient will continue on current medications and need to have a close follow-up with her providers for further management.  Patient is very high risk for deterioration and mortality based on underlying comorbidities, poor functional status and now new diagnosis of brain mass which is most likely a mets from her prior breast cancer.  Oncology to take over her care from here and they will decide when to decide about palliative care and hospice services.

## 2023-03-29 NOTE — ED Triage Notes (Addendum)
First Nurse Note: Pt arrives via EMS from [facility]. Staff called 911 due to generalized weakness and lethargy since 0700 this morning. Pt is AxOx4. She reports some discomfort in her mouth. Denies any other symptoms.

## 2023-03-29 NOTE — ED Provider Notes (Signed)
[facility] Provider Note    Event Date/Time   First MD Initiated Contact with Patient 03/29/23 1536     (approximate)   History   Facial droop  HPI  [name] is a 77 y.o. female who presents to the emergency department today with primary concern for the feeling that her mouth is twisting.  She first noticed this yesterday.  Has persisted to today.  She does feel like she is having a harder time speaking because of this.  She has an odd sensation in her mouth.  Patient denies any new weakness in her arms or legs.  Has had a headache which is unusual for her.  Apparently staff at living facility also felt that she was more fatigued and weak today than her baseline.     Physical Exam   Triage Vital Signs: ED Triage Vitals  Encounter Vitals Group     BP 03/29/23 1348 (!) 142/72     Systolic BP Percentile --      Diastolic BP Percentile --      Pulse Rate 03/29/23 1348 69     Resp 03/29/23 1348 17     Temp 03/29/23 1348 98.6 F (37 C)     Temp src --      SpO2 03/29/23 1333 96 %     Weight 03/29/23 1346 250 lb (113.4 kg)     Height 03/29/23 1346 5\' 9"  (1.753 m)     Head Circumference --      Peak Flow --      Pain Score 03/29/23 1346 6     Pain Loc --      Pain Education --      Exclude from Growth Chart --     Most recent vital signs: Vitals:   03/29/23 1348 03/29/23 1545  BP: (!) 142/72   Pulse: 69 67  Resp: 17 17  Temp: 98.6 F (37 C)   SpO2: 96% 97%   General: Awake, alert, oriented. CV:  Good peripheral perfusion. Regular rate and rhythm. Resp:  Normal effort. Lungs clear. Abd:  No distention. Non tender. Other:  Slight right facial droop. No aphagia. Strength 4+/5 diffusely. Sensation grossly intact   ED Results / Procedures / Treatments   Labs (all labs ordered are listed, but only abnormal results are displayed) Labs Reviewed  BASIC METABOLIC PANEL - Abnormal; Notable for the following components:      Result Value    BUN 27 (*)    All other components within normal limits  CBC - Abnormal; Notable for the following components:   Hemoglobin 11.5 (*)    RDW 16.5 (*)    All other components within normal limits  URINALYSIS, ROUTINE W REFLEX MICROSCOPIC  CBG MONITORING, ED     EKG  I, Phineas Semen, attending physician, personally viewed and interpreted this EKG  EKG Time: 1356 Rate: 63 Rhythm: sinus rhythm with 1st degree av block Axis: normal Intervals: qtc 452 QRS: LVH ST changes: no st elevation Impression: abnormal ekg   RADIOLOGY I independently interpreted and visualized the MR brain. My interpretation: No stroke Radiology interpretation:  IMPRESSION:  Peripherally enhancing mass with central nonenhancement along the  gray-white junction of the right cingulate gyrus, measuring up to 13  mm with surrounding vasogenic edema. Findings are concerning for  metastatic disease.     PROCEDURES:  Critical Care performed: Yes  CRITICAL CARE Performed by: Phineas Semen   Total critical care time: 30 minutes  Critical care time was exclusive of separately billable procedures and treating other patients.  Critical care was necessary to treat or prevent imminent or life-threatening deterioration.  Critical care was time spent personally by me on the following activities: development of treatment plan with patient and/or surrogate as well as nursing, discussions with consultants, evaluation of patient's response to treatment, examination of patient, obtaining history from patient or surrogate, ordering and performing treatments and interventions, ordering and review of laboratory studies, ordering and review of radiographic studies, pulse oximetry and re-evaluation of patient's condition.   Procedures    MEDICATIONS ORDERED IN ED: Medications - No data to display   IMPRESSION / MDM / ASSESSMENT AND PLAN / ED COURSE  I reviewed the triage vital signs and the nursing notes.                               Differential diagnosis includes, but is not limited to, CVA, dehydration, anemia, TIA  Patient's presentation is most consistent with acute presentation with potential threat to life or bodily function.   The patient is on the cardiac monitor to evaluate for evidence of arrhythmia and/or significant heart rate changes.  Patient patient presents to the emergency department today with primary concern for mouth twisting.  Additionally staff was concerned for fatigue and lethargy.  On exam patient might have a slight right facial droop.  Does have a history of TIA per chart review.  At this time would have concern for CVA.  Will plan on getting MRI of the brain to evaluate.  Initial blood work without concerning anemia or electrolyte abnormality.  MRI did not show CVA however unfortunately did show a mass with vasogenic edema.  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  Could potentially be related patient's history of breast cancer.  Discussed with Dr. Emogene [name] with neurosurgery. Recommended starting keppra and decadron. Discussed findings with patient and family over the telephone. Discussed with Dr. Allena Katz with the hospitalist service who will evaluate for admission.    FINAL CLINICAL IMPRESSION(S) / ED DIAGNOSES   Final diagnoses:  Brain mass     Note:  This document was prepared using Dragon voice recognition software and may include unintentional dictation errors.    Phineas Semen, MD 03/29/23 2026

## 2023-03-29 NOTE — ED Notes (Signed)
Pt states she has been feeling weak and has had slurring to speech since yesterday. Speech seems clear, pt does not have teeth in. Tongue appears furrowed. Pt has unlabored respirations. Pt is alert, oriented times 4.

## 2023-03-30 ENCOUNTER — Encounter
Admission: EM | Disposition: A | Discharge status: Skilled Nursing Facility | Payer: Self-pay | Source: Skilled Nursing Facility | Attending: Internal Medicine

## 2023-03-30 ENCOUNTER — Observation Stay: Payer: Medicare Other

## 2023-03-30 ENCOUNTER — Encounter: Payer: Self-pay | Admitting: Internal Medicine

## 2023-03-30 ENCOUNTER — Other Ambulatory Visit (HOSPITAL_COMMUNITY): Payer: Self-pay

## 2023-03-30 DIAGNOSIS — I440 Atrioventricular block, first degree: Secondary | ICD-10-CM | POA: Diagnosis present

## 2023-03-30 DIAGNOSIS — G936 Cerebral edema: Secondary | ICD-10-CM | POA: Diagnosis present

## 2023-03-30 DIAGNOSIS — R079 Chest pain, unspecified: Secondary | ICD-10-CM | POA: Diagnosis not present

## 2023-03-30 DIAGNOSIS — R4781 Slurred speech: Secondary | ICD-10-CM | POA: Diagnosis present

## 2023-03-30 DIAGNOSIS — D496 Neoplasm of unspecified behavior of brain: Secondary | ICD-10-CM | POA: Diagnosis not present

## 2023-03-30 DIAGNOSIS — C50911 Malignant neoplasm of unspecified site of right female breast: Secondary | ICD-10-CM

## 2023-03-30 DIAGNOSIS — D638 Anemia in other chronic diseases classified elsewhere: Secondary | ICD-10-CM

## 2023-03-30 DIAGNOSIS — I82441 Acute embolism and thrombosis of right tibial vein: Secondary | ICD-10-CM

## 2023-03-30 DIAGNOSIS — R471 Dysarthria and anarthria: Secondary | ICD-10-CM | POA: Diagnosis present

## 2023-03-30 DIAGNOSIS — Z79811 Long term (current) use of aromatase inhibitors: Secondary | ICD-10-CM | POA: Diagnosis not present

## 2023-03-30 DIAGNOSIS — Z923 Personal history of irradiation: Secondary | ICD-10-CM | POA: Diagnosis not present

## 2023-03-30 DIAGNOSIS — Z1152 Encounter for screening for COVID-19: Secondary | ICD-10-CM | POA: Diagnosis not present

## 2023-03-30 DIAGNOSIS — E669 Obesity, unspecified: Secondary | ICD-10-CM | POA: Diagnosis present

## 2023-03-30 DIAGNOSIS — I5022 Chronic systolic (congestive) heart failure: Secondary | ICD-10-CM | POA: Diagnosis not present

## 2023-03-30 DIAGNOSIS — Z7984 Long term (current) use of oral hypoglycemic drugs: Secondary | ICD-10-CM | POA: Diagnosis not present

## 2023-03-30 DIAGNOSIS — R29810 Facial weakness: Secondary | ICD-10-CM | POA: Diagnosis present

## 2023-03-30 DIAGNOSIS — I10 Essential (primary) hypertension: Secondary | ICD-10-CM

## 2023-03-30 DIAGNOSIS — N1831 Chronic kidney disease, stage 3a: Secondary | ICD-10-CM

## 2023-03-30 DIAGNOSIS — R54 Age-related physical debility: Secondary | ICD-10-CM | POA: Diagnosis present

## 2023-03-30 DIAGNOSIS — I82409 Acute embolism and thrombosis of unspecified deep veins of unspecified lower extremity: Secondary | ICD-10-CM | POA: Insufficient documentation

## 2023-03-30 DIAGNOSIS — Z9011 Acquired absence of right breast and nipple: Secondary | ICD-10-CM | POA: Diagnosis not present

## 2023-03-30 DIAGNOSIS — Z6836 Body mass index (BMI) 36.0-36.9, adult: Secondary | ICD-10-CM | POA: Diagnosis not present

## 2023-03-30 DIAGNOSIS — Z9221 Personal history of antineoplastic chemotherapy: Secondary | ICD-10-CM | POA: Diagnosis not present

## 2023-03-30 DIAGNOSIS — I130 Hypertensive heart and chronic kidney disease with heart failure and stage 1 through stage 4 chronic kidney disease, or unspecified chronic kidney disease: Secondary | ICD-10-CM | POA: Diagnosis present

## 2023-03-30 DIAGNOSIS — Z170 Estrogen receptor positive status [ER+]: Secondary | ICD-10-CM

## 2023-03-30 DIAGNOSIS — Z87891 Personal history of nicotine dependence: Secondary | ICD-10-CM | POA: Diagnosis not present

## 2023-03-30 DIAGNOSIS — N1832 Chronic kidney disease, stage 3b: Secondary | ICD-10-CM | POA: Diagnosis present

## 2023-03-30 DIAGNOSIS — C7931 Secondary malignant neoplasm of brain: Secondary | ICD-10-CM | POA: Diagnosis present

## 2023-03-30 DIAGNOSIS — Z8249 Family history of ischemic heart disease and other diseases of the circulatory system: Secondary | ICD-10-CM | POA: Diagnosis not present

## 2023-03-30 DIAGNOSIS — D631 Anemia in chronic kidney disease: Secondary | ICD-10-CM | POA: Diagnosis present

## 2023-03-30 DIAGNOSIS — K219 Gastro-esophageal reflux disease without esophagitis: Secondary | ICD-10-CM | POA: Diagnosis present

## 2023-03-30 DIAGNOSIS — I5032 Chronic diastolic (congestive) heart failure: Secondary | ICD-10-CM | POA: Diagnosis present

## 2023-03-30 HISTORY — PX: IVC FILTER INSERTION: CATH118245

## 2023-03-30 LAB — CBC
HCT: 36 % (ref 36.0–46.0)
Hemoglobin: 11.4 g/dL — ABNORMAL LOW (ref 12.0–15.0)
MCH: 26.2 pg (ref 26.0–34.0)
MCHC: 31.7 g/dL (ref 30.0–36.0)
MCV: 82.8 fL (ref 80.0–100.0)
Platelets: 277 10*3/uL (ref 150–400)
RBC: 4.35 MIL/uL (ref 3.87–5.11)
RDW: 16.3 % — ABNORMAL HIGH (ref 11.5–15.5)
WBC: 5.8 10*3/uL (ref 4.0–10.5)
nRBC: 0 % (ref 0.0–0.2)

## 2023-03-30 LAB — COMPREHENSIVE METABOLIC PANEL
ALT: 12 U/L (ref 0–44)
AST: 13 U/L — ABNORMAL LOW (ref 15–41)
Albumin: 3.4 g/dL — ABNORMAL LOW (ref 3.5–5.0)
Alkaline Phosphatase: 54 U/L (ref 38–126)
Anion gap: 8 (ref 5–15)
BUN: 27 mg/dL — ABNORMAL HIGH (ref 8–23)
CO2: 29 mmol/L (ref 22–32)
Calcium: 8.8 mg/dL — ABNORMAL LOW (ref 8.9–10.3)
Chloride: 103 mmol/L (ref 98–111)
Creatinine, Ser: 0.87 mg/dL (ref 0.44–1.00)
GFR, Estimated: >60 mL/min (ref >60)
Glucose, Bld: 137 mg/dL — ABNORMAL HIGH (ref 70–99)
Potassium: 4.2 mmol/L (ref 3.5–5.1)
Sodium: 140 mmol/L (ref 135–145)
Total Bilirubin: 0.5 mg/dL (ref 0.3–1.2)
Total Protein: 7.3 g/dL (ref 6.5–8.1)

## 2023-03-30 LAB — TYPE AND SCREEN
ABO/RH(D): O POS
Antibody Screen: NEGATIVE

## 2023-03-30 LAB — D-DIMER, QUANTITATIVE: D-Dimer, Quant: 1.2 ug/mL-FEU — ABNORMAL HIGH (ref 0.00–0.50)

## 2023-03-30 SURGERY — IVC FILTER INSERTION
Anesthesia: Moderate Sedation

## 2023-03-30 MED ORDER — HEPARIN SODIUM (PORCINE) 5000 UNIT/ML IJ SOLN
5000.0000 [IU] | Freq: Three times a day (TID) | INTRAMUSCULAR | Status: DC
  Administered 2023-03-30: 5000 [IU] via SUBCUTANEOUS
  Filled 2023-03-30: qty 1

## 2023-03-30 MED ORDER — HYDROMORPHONE HCL 1 MG/ML IJ SOLN: 1.0000 mg | Freq: Once | INTRAMUSCULAR | Status: DC | PRN

## 2023-03-30 MED ORDER — FENTANYL CITRATE PF 50 MCG/ML IJ SOSY
PREFILLED_SYRINGE | INTRAMUSCULAR | Status: AC
  Filled 2023-03-30: qty 1

## 2023-03-30 MED ORDER — CEFAZOLIN SODIUM-DEXTROSE 2-4 GM/100ML-% IV SOLN
INTRAVENOUS | Status: AC
  Filled 2023-03-30: qty 100

## 2023-03-30 MED ORDER — MIDAZOLAM HCL 2 MG/ML PO SYRP: 8.0000 mg | ORAL_SOLUTION | Freq: Once | ORAL | Status: DC | PRN

## 2023-03-30 MED ORDER — METHYLPREDNISOLONE SODIUM SUCC 125 MG IJ SOLR: 125.0000 mg | Freq: Once | INTRAMUSCULAR | Status: DC | PRN

## 2023-03-30 MED ORDER — SODIUM CHLORIDE 0.9 % IV SOLN: INTRAVENOUS | Status: DC

## 2023-03-30 MED ORDER — LIDOCAINE-EPINEPHRINE (PF) 1 %-1:200000 IJ SOLN
INTRAMUSCULAR | Status: DC | PRN
  Administered 2023-03-30: 10 mL

## 2023-03-30 MED ORDER — CEFAZOLIN SODIUM-DEXTROSE 2-4 GM/100ML-% IV SOLN
2.0000 g | INTRAVENOUS | Status: AC
  Administered 2023-03-30: 2 g via INTRAVENOUS

## 2023-03-30 MED ORDER — DIPHENHYDRAMINE HCL 50 MG/ML IJ SOLN: 50.0000 mg | Freq: Once | INTRAMUSCULAR | Status: DC | PRN

## 2023-03-30 MED ORDER — IODIXANOL 320 MG/ML IV SOLN
INTRAVENOUS | Status: DC | PRN
  Administered 2023-03-30: 15 mL

## 2023-03-30 MED ORDER — MIDAZOLAM HCL 2 MG/2ML IJ SOLN
INTRAMUSCULAR | Status: AC
  Filled 2023-03-30: qty 2

## 2023-03-30 MED ORDER — ONDANSETRON HCL 4 MG/2ML IJ SOLN
INTRAMUSCULAR | Status: AC
  Filled 2023-03-30: qty 2

## 2023-03-30 MED ORDER — ONDANSETRON HCL 4 MG/2ML IJ SOLN
4.0000 mg | Freq: Four times a day (QID) | INTRAMUSCULAR | Status: DC | PRN
  Administered 2023-03-30: 4 mg via INTRAVENOUS

## 2023-03-30 MED ORDER — FENTANYL CITRATE PF 50 MCG/ML IJ SOSY: 12.5000 ug | PREFILLED_SYRINGE | Freq: Once | INTRAMUSCULAR | Status: DC | PRN

## 2023-03-30 MED ORDER — MIDAZOLAM HCL 2 MG/2ML IJ SOLN
INTRAMUSCULAR | Status: DC | PRN
  Administered 2023-03-30 (×2): 1 mg via INTRAVENOUS

## 2023-03-30 MED ORDER — FAMOTIDINE 20 MG PO TABS: 40.0000 mg | ORAL_TABLET | Freq: Once | ORAL | Status: DC | PRN

## 2023-03-30 MED ORDER — FENTANYL CITRATE (PF) 100 MCG/2ML IJ SOLN
INTRAMUSCULAR | Status: DC | PRN
  Administered 2023-03-30 (×2): 25 ug via INTRAVENOUS

## 2023-03-30 MED ORDER — APIXABAN 5 MG PO TABS
5.0000 mg | ORAL_TABLET | Freq: Two times a day (BID) | ORAL | Status: DC
  Administered 2023-03-31: 5 mg via ORAL
  Filled 2023-03-30: qty 1

## 2023-03-30 SURGICAL SUPPLY — 5 items
COVER EZ STRL 42X30 (DRAPES) IMPLANT
COVER PROBE ULTRASOUND 5X96 (MISCELLANEOUS) IMPLANT
KIT FEM OPTION ELITE FILTER (Filter) IMPLANT
PACK ANGIOGRAPHY (CUSTOM PROCEDURE TRAY) ×1 IMPLANT
WIRE SUPRACORE 190CM (WIRE) IMPLANT

## 2023-03-30 NOTE — ED Notes (Signed)
Patient talking on phone at this time. NAD noted. Respirations even and unlabored.

## 2023-03-30 NOTE — Consult Note (Signed)
ANTICOAGULATION CONSULT NOTE - Initial Consult  Pharmacy Consult for Apixaban Indication: DVT  No Known Allergies  Patient Measurements: Height: 5\' 9"  (175.3 cm) Weight: 113.4 kg (250 lb) IBW/kg (Calculated) : 66.2  Vital Signs: Temp: 98 F (36.7 C) (07/22 0954) Temp Source: Oral (07/22 0954) BP: 156/94 (07/22 0800) Pulse Rate: 77 (07/22 0800)  Labs: Recent Labs    03/29/23 1400 03/29/23 1401 03/30/23 0542  HGB  --  11.5* 11.4*  HCT  --  36.5 36.0  PLT  --  261 277  CREATININE  --  0.88 0.87  TROPONINIHS 12  --   --     Estimated Creatinine Clearance: 72.8 mL/min (by C-G formula based on SCr of 0.87 mg/dL).   Medical History: Past Medical History:  Diagnosis Date   Abnormality of gait    Acute on chronic diastolic CHF (congestive heart failure) (HCC) 11/20/2017   Acute respiratory failure with hypoxia (HCC) 06/13/2022   AKI (acute kidney injury) (HCC) 03/03/2022   Breast cancer (HCC) 02/05/2005   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    Breast cancer (HCC) 2019   right breast   CHF (congestive heart failure) (HCC)    March 28, 2017 echo: EF 50-55%; 1) echo 07/2011: EF 20-25%, mild concentric hypertrophy, diffuse HK, regional WMA cannot be excluded, grade 1 DD, mitral valve with mild regurg, LA mildly dilated). 2) EF 40-45%, mild AS,                     Chronic kidney disease    Chronic kidney disease, stage 3b (HCC) 09/10/2022   Complication of anesthesia 2006   pt states "hard to wake up" after L breast surgery    Edema    GERD (gastroesophageal reflux disease)    Glucose intolerance 09/10/2022   Headache    Heart murmur    Hypertension    Hypertensive emergency 06/13/2022   Hypokinesis    global, EF 35-45 % from Echo.   Lower limb ulcer, calf, left, limited to breakdown of skin (HCC) 12/11/2020   Malignant neoplasm of breast (female), unspecified site 06/14/2018   RIGHT  T1b, N0; ER/PR positive, HER-2 negative.   Neuropathy    Obesity    Pain in joint, site unspecified    Personal history of chemotherapy 2006   left breast   Personal history of radiation therapy 2019   right breast   Personal history of radiation therapy 2006   left breast   Pneumonia    Pyuria 08/22/2022   Sleep apnea    Syncope 03/03/2022   Tinea pedis    Unspecified hearing loss    bilateral   Weakness 03/03/2022    Assessment: [name] is a 77 y.o. female presenting with DVT. PMH significant for h/o breast cancer, CHF, CKD3a, HTN, anemia, PAD. Patient was not on [facility] PTA per chart review. Per MD, oncology and neurosurgery have cleared her for anticoagulation. Anticoagulation has been deemed high risk for this patient given her medical history and she is scheduled for IVC filter placement 7/22.  Pharmacy has been consulted to initiate apixaban.    Plan:  Start apixaban 5 mg BID without load after IVC placement per discussion with MD Pharmacy will sign off and continue to monitor peripherally     Celene Squibb, PharmD Clinical Pharmacist 03/30/2023 2:06 PM

## 2023-03-30 NOTE — Assessment & Plan Note (Addendum)
Stable. -Monitor renal function -Avoid nephrotoxins

## 2023-03-30 NOTE — Assessment & Plan Note (Addendum)
-  Continue losartan and Imdur.

## 2023-03-30 NOTE — H&P (View-Only) (Signed)
Hospital Consult    Reason for Consult:  DVT requiring an IVC Filter placement  Requesting Physician:  Dr Phineas Semen MD MRN #:  [id number]  History of Present Illness: This is a 77 y.o. female with medical history significant for history of breast cancer, heart failure, chronic kidney disease presenting with generalized weakness and lethargy since early morning today patient denied any pain and just said that she was very weak all over.  States that her left leg is also very weak and she has not walked very well in a while.  Per report patient feels that she feels difficulty speaking but other than that no headaches currently vision speech or falls.  No chest pain shortness of breath palpitations nausea vomiting diarrhea.   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 Due to the current possible metastatic cancer the patient is not a candidate for Anticoagulation therapy. Vascular Surgery consulted for IVC filter placement. I spoke with patients medical POA and Daughter Zorina Mallin who states the patient does sign her own consents for medical treatment. She is of sound mind but likes me to help guide her in her care.    Past Medical History:  Diagnosis Date   Abnormality of gait    Acute on chronic diastolic CHF (congestive heart failure) (HCC) 11/20/2017   Acute respiratory failure with hypoxia (HCC) 06/13/2022   AKI (acute kidney injury) (HCC) 03/03/2022   Breast cancer (HCC) 02/05/2005   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    Breast cancer (HCC) 2019   right breast   CHF (congestive heart failure) (HCC)    March 28, 2017 echo: EF 50-55%; 1) echo 07/2011: EF 20-25%, mild concentric hypertrophy, diffuse HK, regional WMA cannot be excluded, grade 1 DD, mitral valve with mild regurg, LA mildly dilated). 2) EF 40-45%, mild AS,                      Chronic kidney disease    Chronic kidney disease, stage 3b (HCC) 09/10/2022   Complication of anesthesia 2006   pt states "hard to wake up" after L breast surgery    Edema    GERD (gastroesophageal reflux disease)    Glucose intolerance 09/10/2022   Headache    Heart murmur    Hypertension    Hypertensive emergency 06/13/2022   Hypokinesis    global, EF 35-45 % from Echo.   Lower limb ulcer, calf, left, limited to breakdown of skin (HCC) 12/11/2020   Malignant neoplasm of breast (female), unspecified site 06/14/2018   RIGHT T1b, N0; ER/PR positive, HER-2 negative.   Neuropathy    Obesity    Pain in joint, site unspecified    Personal history of chemotherapy 2006   left breast   Personal history of radiation therapy 2019   right breast   Personal history of radiation therapy 2006   left breast   Pneumonia    Pyuria 08/22/2022   Sleep apnea    Syncope 03/03/2022   Tinea pedis    Unspecified hearing loss    bilateral   Weakness 03/03/2022    Past Surgical History:  Procedure Laterality Date   BREAST BIOPSY Right 2019   11 oc-INVASIVE MAMMARY CARCINOMA, NO SPECIAL TYPE   BREAST BIOPSY Right 2019   1030 oc- neg   BREAST LUMPECTOMY  2006   left  breast    CATARACT EXTRACTION     left eye   HERNIA REPAIR  05/25/2006   Incarcerated omentum in ventral hernia, Ventralight mesh, combined lap/ open with omental resection.    PARTIAL MASTECTOMY WITH NEEDLE LOCALIZATION Right 06/14/2018   Procedure: PARTIAL MASTECTOMY WITH NEEDLE LOCALIZATION;  Surgeon: Earline Mayotte, MD;  Location: [facility] ORS;  Service: General;  Laterality: Right;   PORT-A-CATH REMOVAL  05/25/2006   SENTINEL NODE BIOPSY Right 06/14/2018   Procedure: SENTINEL NODE BIOPSY;  Surgeon: Earline Mayotte, MD;  Location: [facility];  Service: General;  Laterality: Right;   TUBAL LIGATION      No Known Allergies  Prior to Admission medications   Medication Sig Start Date End Date Taking?  Authorizing Provider  acetaminophen (TYLENOL) 325 MG tablet Take 2 tablets (650 mg total) by mouth every 6 (six) hours as needed for mild pain (or Fever >/= 101). 09/15/22  Yes Gillis Santa, MD  anastrozole (ARIMIDEX) 1 MG tablet Take 1 tablet (1 mg total) by mouth daily. 01/19/23  Yes Creig Hines, MD  ascorbic acid (VITAMIN C) 500 MG tablet Take 1 tablet by mouth 2 (two) times daily.   Yes [provider]  aspirin EC 81 MG tablet Take 81 mg by mouth daily.   Yes [provider]  bisacodyl (DULCOLAX) 5 MG EC tablet Take 2 tablets (10 mg total) by mouth at bedtime. Skip the dose if no constipation Patient taking differently: Take 10 mg by mouth 2 (two) times daily. Skip the dose if no constipation 09/15/22  Yes Gillis Santa, MD  Calcium Carbonate-Vitamin D 600-5 MG-MCG TABS Take 1 tablet by mouth 2 (two) times daily.   Yes [provider]  cloNIDine (CATAPRES) 0.1 MG tablet Take 1 tablet (0.1 mg total) by mouth 2 (two) times daily as needed (systolic BP > 150 or diastolic BP > 95). 08/23/22  Yes Marrion Coy, MD  diclofenac Sodium (VOLTAREN) 1 % GEL Apply 2 g topically 2 (two) times daily as needed.   Yes [provider]  Emollient (EUCERIN) lotion Apply topically 2 (two) times daily. Apply to dry skin especially bilateral lower extremities and arms. 09/15/22  Yes Gillis Santa, MD  empagliflozin (JARDIANCE) 10 MG TABS tablet Take 1 tablet (10 mg total) by mouth daily before breakfast. 08/23/22  Yes Marrion Coy, MD  ferrous sulfate 325 (65 FE) MG tablet Take 325 mg by mouth every other day. 12/16/22  Yes [provider]  gabapentin (NEURONTIN) 300 MG capsule Take 300 mg by mouth 2 (two) times daily.   Yes [provider]  guaiFENesin (MUCINEX) 600 MG 12 hr tablet Take 600 mg by mouth every 12 (twelve) hours.   Yes [provider]  HYDROcodone-acetaminophen (NORCO/VICODIN) 5-325 MG tablet Take 1 tablet by mouth 3 (three) times daily as needed.    Yes [provider]  ipratropium-albuterol (DUONEB) 0.5-2.5 (3) MG/3ML SOLN Take 3 mLs by nebulization every 4 (four) hours as needed (wheezing). 12/04/22  Yes [provider]  isosorbide mononitrate (IMDUR) 60 MG 24 hr tablet Take 1 tablet (60 mg total) by mouth 2 (two) times daily. 08/23/22  Yes Marrion Coy, MD  loratadine (CLARITIN) 10 MG tablet Take 10 mg by mouth daily. 07/20/19  Yes [provider]  losartan (COZAAR) 50 MG tablet Take 50 mg by mouth daily.   Yes [provider]  omeprazole (PRILOSEC) 40 MG capsule Take 40 mg by mouth daily. 04/13/21  Yes [provider]  oxybutynin (DITROPAN) 5 MG tablet Take 5 mg by mouth 2 (two) times daily.   Yes [provider]  polyethylene glycol (MIRALAX / GLYCOLAX) 17 g packet Take 17 g by mouth daily. Skip the dose if no constipation 09/15/22  Yes Gillis Santa, MD  pregabalin (LYRICA) 75 MG capsule Take 75 mg by mouth daily. 03/19/23  Yes [provider]  sennosides-docusate sodium (SENOKOT-S) 8.6-50 MG tablet Take 1 tablet by mouth 2 (two) times daily.   Yes [provider]  spironolactone (ALDACTONE) 25 MG tablet Take 12.5 mg by mouth daily.   Yes [provider]  traMADol HCl 25 MG TABS Take 1 tablet by mouth every 6 (six) hours as needed. 10/30/22  Yes [provider]  vitamin B-12 (CYANOCOBALAMIN) 500 MCG tablet Take 500 mcg by mouth daily.   Yes [provider]  Vitamin D, Ergocalciferol, (DRISDOL) 1.25 MG (50000 UNIT) CAPS capsule Take 50,000 Units by mouth once a week. 03/23/23  Yes [provider]  Zinc Oxide (TRIPLE PASTE) 12.8 % ointment Apply barrier cream to buttocks every shift.   Yes [provider]  cloNIDine HCl (KAPVAY) 0.1 MG TB12 ER tablet Take 0.1 mg by mouth 2 (two) times daily. PRN Patient not taking: Reported on 03/30/2023    [provider]  furosemide (LASIX) 20 MG tablet Take 20 mg by mouth daily. Patient  not taking: Reported on 03/06/2023 12/24/22   [provider]  Incontinence Supply Disposable (BLADDER CONTROL PADS EX ABSORB) MISC Apply 1 Dose topically as needed.  03/22/18   [provider]    Social History   Socioeconomic History   Marital status: Divorced    Spouse name: Not on file   Number of children: Not on file   Years of education: Not on file   Highest education level: Not on file  Occupational History   Occupation: retired  Tobacco Use   Smoking status: Former    Current packs/day: 0.00    Average packs/day: 0.5 packs/day for 30.0 years (15.0 ttl pk-yrs)    Types: Cigarettes    Start date: 07/14/1970    Quit date: 07/14/2000    Years since quitting: 22.7   Smokeless tobacco: Never  Vaping Use   Vaping status: Never Used  Substance and Sexual Activity   Alcohol use: No   Drug use: Not Currently    Types: Marijuana    Comment: endorses prior marijuana use 1999   Sexual activity: Never  Other Topics Concern   Not on file  Social History Narrative   Not on file   Social Determinants of Health   Financial Resource Strain: Not on file  Food Insecurity: No Food Insecurity (09/11/2022)   Hunger Vital Sign    Worried About Running Out of Food in the Last Year: Never true    Ran Out of Food in the Last Year: Never true  Transportation Needs: No Transportation Needs (09/11/2022)   PRAPARE - Administrator, Civil Service (Medical): No    Lack of Transportation (Non-Medical): No  Physical Activity: Not on file  Stress: Not on file  Social Connections: Not on file  Intimate Partner Violence: Not At Risk (09/11/2022)   Humiliation, Afraid, Rape, and Kick questionnaire    Fear of Current or Ex-Partner: No    Emotionally Abused: No    Physically Abused: No    Sexually Abused: No     Family History  Problem Relation Age of Onset   Hypertension  Mother        deceased 87   Cataracts Mother    Breast cancer Other 29       maternal  half-sister; deceased 33   Coronary artery disease Other     ROS: Otherwise negative unless mentioned in HPI  Physical Examination  Vitals:   03/30/23 0800 03/30/23 0954  BP: (!) 156/94   Pulse: 77   Resp: 13   Temp:  98 F (36.7 C)  SpO2: 98%    Body mass index is 36.92 kg/m.  General:  WDWN in NAD Gait: Not observed HENT: WNL, normocephalic Pulmonary: normal non-labored breathing, without Rales, rhonchi,  wheezing Cardiac: , without  Murmurs, rubs or gallops; No  carotid bruits Abdomen: Positive bowel sounds throughout, soft, NT/ND, no masses Skin: without rashes Vascular Exam/Pulses: Palpable pulses throughout with bilateral +1 edema.  Extremities: without ischemic changes, without Gangrene , without cellulitis; without open wounds;  Musculoskeletal: no muscle wasting or atrophy  Neurologic: A&O X 3;  No focal weakness or paresthesias are detected; speech is fluent/normal Psychiatric:  The pt has Normal affect. Lymph:  Unremarkable  CBC    Component Value Date/Time   WBC 5.8 03/30/2023 0542   RBC 4.35 03/30/2023 0542   HGB 11.4 (L) 03/30/2023 0542   HGB 11.8 (L) 03/23/2014 0518   HCT 36.0 03/30/2023 0542   HCT 32.8 (L) 03/22/2014 0403   PLT 277 03/30/2023 0542   PLT 252 03/23/2014 0518   MCV 82.8 03/30/2023 0542   MCV 88 03/22/2014 0403   MCH 26.2 03/30/2023 0542   MCHC 31.7 03/30/2023 0542   RDW 16.3 (H) 03/30/2023 0542   RDW 15.0 (H) 03/22/2014 0403   LYMPHSABS 1.3 08/22/2022 1402   LYMPHSABS 1.1 03/22/2014 0403   MONOABS 0.7 08/22/2022 1402   MONOABS 0.7 03/22/2014 0403   EOSABS 0.1 08/22/2022 1402   EOSABS 0.0 03/22/2014 0403   BASOSABS 0.1 08/22/2022 1402   BASOSABS 0.0 03/22/2014 0403    BMET    Component Value Date/Time   NA 140 03/30/2023 0542   NA 142 07/04/2021 1503   NA 143 03/22/2014 0403   K 4.2 03/30/2023 0542   K 3.4 (L) 03/22/2014 0403   CL 103 03/30/2023 0542   CL 107 03/22/2014 0403   CO2 29 03/30/2023 0542   CO2 32  03/22/2014 0403   GLUCOSE 137 (H) 03/30/2023 0542   GLUCOSE 88 03/22/2014 0403   BUN 27 (H) 03/30/2023 0542   BUN 27 07/04/2021 1503   BUN 17 03/22/2014 0403   CREATININE 0.87 03/30/2023 0542   CREATININE 0.99 03/22/2014 0403   CALCIUM 8.8 (L) 03/30/2023 0542   CALCIUM 8.2 (L) 03/22/2014 0403   GFRNONAA >60 03/30/2023 0542   GFRNONAA 59 (L) 03/22/2014 0403   GFRAA 42 (L) 02/10/2020 1016   GFRAA >60 03/22/2014 0403    COAGS: Lab Results  Component Value Date   INR 1.07 03/28/2017   INR 1.1 03/22/2014     Non-Invasive Vascular Imaging:   EXAM:03/30/23 BILATERAL LOWER EXTREMITY VENOUS DOPPLER ULTRASOUND   TECHNIQUE: Gray-scale sonography with graded compression, as well as color Doppler and duplex ultrasound were performed to evaluate the lower extremity deep venous systems from the level of the common femoral vein and including the common femoral, femoral, profunda femoral, popliteal and calf veins including the posterior tibial, peroneal and gastrocnemius veins when visible. The superficial great saphenous vein was also interrogated. Spectral Doppler was utilized to evaluate flow at rest and with distal augmentation  maneuvers in the common femoral, femoral and popliteal veins.   COMPARISON:  None Available.   FINDINGS: RIGHT LOWER EXTREMITY   Common Femoral Vein: No evidence of thrombus. Normal compressibility, respiratory phasicity and response to augmentation.   Saphenofemoral Junction: No evidence of thrombus. Normal compressibility and flow on color Doppler imaging.   Profunda Femoral Vein: No evidence of thrombus. Normal compressibility and flow on color Doppler imaging.   Femoral Vein: No evidence of thrombus. Normal compressibility, respiratory phasicity and response to augmentation.   Popliteal Vein: No evidence of thrombus. Normal compressibility, respiratory phasicity and response to augmentation.   Calf Veins: Thrombus is noted in the posterior  tibial vein. The peroneal vein is not well visualized.   Superficial Great Saphenous Vein: No evidence of thrombus. Normal compressibility.   Venous Reflux:  None.   Other Findings:  None.   LEFT LOWER EXTREMITY   Common Femoral Vein: No evidence of thrombus. Normal compressibility, respiratory phasicity and response to augmentation.   Saphenofemoral Junction: No evidence of thrombus. Normal compressibility and flow on color Doppler imaging.   Profunda Femoral Vein: No evidence of thrombus. Normal compressibility and flow on color Doppler imaging.   Femoral Vein: No evidence of thrombus. Normal compressibility, respiratory phasicity and response to augmentation.   Popliteal Vein: No evidence of thrombus. Normal compressibility, respiratory phasicity and response to augmentation.   Calf Veins: Calf veins are not well visualized.   Superficial Great Saphenous Vein: No evidence of thrombus. Normal compressibility.   Venous Reflux:  None.   Other Findings:  None.   IMPRESSION: Thrombus noted in the right posterior tibial vein. No other focal abnormality is noted.  Statin:  No. Beta Blocker:  No. Aspirin:  Yes.   ACEI:  No. ARB:  Yes.   CCB use:  No Other antiplatelets/anticoagulants:  No.    ASSESSMENT/PLAN: This is a 77 y.o. female who presents to [facility] ER with overall generalized weakness. Upon workup she was found to have a 13 mm brain mass which may be metastatic breast cancer. She has a history of breast cancer. She is not a candidate for anticoagulation due to her medical history. Vascular Surgery consulted for endovascular placement of an Inferior Vena Cava Filter.   PLAN: Vascular surgery plans on taking the patient to the vascular lab for IVC filter placement later today 03/30/23. I discussed in detail with the patient and her daughter Jenny Lai her POA over the phone, the procedure, benefits, risks and complications. Both verbalized their understanding.  Both would like to proceed later today. I answered all questions today. Patient remains NPO after midnight for procedure.     -I discussed the plan in detail with Dr Festus Barren MD and he agrees with the plan.    Marcie [facility] 03/30/2023 10:40 AM

## 2023-03-30 NOTE — Assessment & Plan Note (Addendum)
Not sure if this is acute pt is high risk for DVT and also has had DVT in left leg in past.  Patient was found to have DVT involving right lower extremity. -Vascular surgery was consulted to place IVC filter. -Will start on Eliquis without any loading dose after discussing with neurosurgery and oncology .

## 2023-03-30 NOTE — Assessment & Plan Note (Addendum)
Pt is Alert/awake and oriented.  Resolved on my exam.

## 2023-03-30 NOTE — Assessment & Plan Note (Addendum)
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  Radiation oncology and oncology is on board and they will follow-up as outpatient.

## 2023-03-30 NOTE — H&P (Incomplete)
History and Physical    Patient: [name] MWN:[id number] DOB: 1946-05-19 DOA: 03/29/2023 DOS: the patient was seen and examined on 03/29/2023 PCP: Emogene [name], MD  Patient coming from: {Point_of_Origin:[address]}   Chief Complaint:  Chief Complaint  Patient presents with  . Weakness  . Fatigue    HPI: [name] is a 77 y.o. female with medical history significant for ***   Review of Systems: Review of Systems  Neurological:  Positive for weakness.       Confusion       Past Medical History:  Diagnosis Date  . Abnormality of gait   . Acute on chronic diastolic CHF (congestive heart failure) (HCC) 11/20/2017  . Acute respiratory failure with hypoxia (HCC) 06/13/2022  . AKI (acute kidney injury) (HCC) 03/03/2022  . 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   . Breast cancer (HCC) 2019   right breast  . CHF (congestive heart failure) (HCC)    March 28, 2017 echo: EF 50-55%; 1) echo 07/2011: EF 20-25%, mild concentric hypertrophy, diffuse HK, regional WMA cannot be excluded, grade 1 DD, mitral valve with mild regurg, LA mildly dilated). 2) EF 40-45%, mild AS,                    . Chronic kidney disease   . Chronic kidney disease, stage 3b (HCC) 09/10/2022  . Complication of anesthesia 2006   pt states "hard to wake up" after L breast surgery   . Edema   . GERD (gastroesophageal reflux disease)   . Glucose intolerance 09/10/2022  . Headache   . Heart murmur   . Hypertension   . Hypertensive emergency 06/13/2022  . Hypokinesis    global, EF 35-45 % from Echo.  . Lower limb ulcer, calf, left, limited to breakdown of skin (HCC) 12/11/2020  . Malignant neoplasm of breast (female), unspecified site 06/14/2018   RIGHT T1b, N0; ER/PR positive, HER-2 negative.  . Neuropathy   . Obesity   . Pain in joint, site unspecified   . Personal history of  chemotherapy 2006   left breast  . Personal history of radiation therapy 2019   right breast  . Personal history of radiation therapy 2006   left breast  . Pneumonia   . Pyuria 08/22/2022  . Sleep apnea   . Syncope 03/03/2022  . Tinea pedis   . Unspecified hearing loss    bilateral  . Weakness 03/03/2022   Past Surgical History:  Procedure Laterality Date  . BREAST BIOPSY Right 2019   11 oc-INVASIVE MAMMARY CARCINOMA, NO SPECIAL TYPE  . BREAST BIOPSY Right 2019   1030 oc- neg  . BREAST LUMPECTOMY  2006   left breast   . CATARACT EXTRACTION     left eye  . HERNIA REPAIR  05/25/2006   Incarcerated omentum in ventral hernia, Ventralight mesh, combined lap/ open with omental resection.   Marland Kitchen PARTIAL MASTECTOMY WITH NEEDLE LOCALIZATION Right 06/14/2018   Procedure: PARTIAL MASTECTOMY WITH NEEDLE LOCALIZATION;  Surgeon: Earline Mayotte, MD;  Location: [facility] ORS;  Service: General;  Laterality: Right;  . PORT-A-CATH REMOVAL  05/25/2006  . SENTINEL NODE BIOPSY Right 06/14/2018   Procedure: SENTINEL NODE BIOPSY;  Surgeon: Earline Mayotte, MD;  Location: [facility] ORS;  Service: General;  Laterality: Right;  . TUBAL LIGATION     Social History:  reports that she quit smoking about 22 years  ago. Her smoking use included cigarettes. She started smoking about 52 years ago. She has a 15 pack-year smoking history. She has never used smokeless tobacco. She reports that she does not currently use drugs after having used the following drugs: Marijuana. She reports that she does not drink alcohol.  No Known Allergies  Family History  Problem Relation Age of Onset  . Hypertension Mother        deceased 56  . Cataracts Mother   . Breast cancer Other 73       maternal half-sister; deceased 59  . Coronary artery disease Other     Prior to Admission medications   Medication Sig Start Date End Date Taking? Authorizing Provider  acetaminophen (TYLENOL) 325 MG tablet Take 2 tablets (650 mg total)  by mouth every 6 (six) hours as needed for mild pain (or Fever >/= 101). 09/15/22   Gillis Santa, MD  anastrozole (ARIMIDEX) 1 MG tablet Take 1 tablet (1 mg total) by mouth daily. 01/19/23   Creig Hines, MD  ascorbic acid (VITAMIN C) 500 MG tablet Take 1 tablet by mouth 2 (two) times daily.    [provider]  aspirin EC 81 MG tablet Take 81 mg by mouth daily.    [provider]  bisacodyl (DULCOLAX) 5 MG EC tablet Take 2 tablets (10 mg total) by mouth at bedtime. Skip the dose if no constipation 09/15/22   Gillis Santa, MD  cloNIDine (CATAPRES) 0.1 MG tablet Take 1 tablet (0.1 mg total) by mouth 2 (two) times daily as needed (systolic BP > 150 or diastolic BP > 95). 08/23/22   Marrion Coy, MD  cloNIDine HCl (KAPVAY) 0.1 MG TB12 ER tablet Take 0.1 mg by mouth 2 (two) times daily. PRN    [provider]  [employer] CALCIUM + D3 600-20 MG-MCG TABS TAKE 1 TABLET BY MOUTH TWICE A DAY 02/04/22   Creig Hines, MD  diclofenac Sodium (VOLTAREN) 1 % GEL Apply 2 g topically 4 (four) times daily.    [provider]  Emollient (EUCERIN) lotion Apply topically 2 (two) times daily. Apply to dry skin especially bilateral lower extremities and arms. 09/15/22   Gillis Santa, MD  empagliflozin (JARDIANCE) 10 MG TABS tablet Take 1 tablet (10 mg total) by mouth daily before breakfast. 08/23/22   Marrion Coy, MD  ferrous sulfate 325 (65 FE) MG tablet Take 325 mg by mouth every other day. 12/16/22   [provider]  furosemide (LASIX) 20 MG tablet Take 20 mg by mouth daily. Patient not taking: Reported on 03/06/2023 12/24/22   [provider]  gabapentin (NEURONTIN) 300 MG capsule Take 300 mg by mouth 3 (three) times daily.    [provider]  HYDROcodone-acetaminophen (NORCO/VICODIN) 5-325 MG tablet Take 1 tablet by mouth 3 (three) times daily as needed.    [provider]  Incontinence Supply Disposable (BLADDER CONTROL PADS EX ABSORB) MISC Apply 1 Dose  topically as needed.  03/22/18   [provider]  ipratropium-albuterol (DUONEB) 0.5-2.5 (3) MG/3ML SOLN Take by nebulization. 12/04/22   [provider]  isosorbide mononitrate (IMDUR) 60 MG 24 hr tablet Take 1 tablet (60 mg total) by mouth 2 (two) times daily. 08/23/22   Marrion Coy, MD  loratadine (CLARITIN) 10 MG tablet Take 10 mg by mouth daily. 07/20/19   [provider]  losartan (COZAAR) 50 MG tablet Take 50 mg by mouth daily.    [provider]  omeprazole (PRILOSEC) 40 MG  capsule Take 40 mg by mouth daily. 04/13/21   [provider]  oxybutynin (DITROPAN) 5 MG tablet Take 5 mg by mouth 2 (two) times daily.    [provider]  polyethylene glycol (MIRALAX / GLYCOLAX) 17 g packet Take 17 g by mouth daily. Skip the dose if no constipation 09/15/22   Gillis Santa, MD  traMADol HCl 25 MG TABS Take 1 tablet by mouth every 6 (six) hours as needed. 10/30/22   [provider]  vitamin B-12 (CYANOCOBALAMIN) 500 MCG tablet Take 500 mcg by mouth daily.    [provider]     Vitals:   03/29/23 1630 03/29/23 1700 03/29/23 1915 03/29/23 1937  BP: 131/70 133/65    Pulse: 67 77 83   Resp: 15 15 16    Temp:    97.9 F (36.6 C)  TempSrc:    Oral  SpO2: 98% 98% 94%   Weight:      Height:       Physical Exam Vitals and nursing note reviewed.  Constitutional:      General: She is not in acute distress. HENT:     Head: Normocephalic and atraumatic.     Right Ear: Hearing normal.     Left Ear: Hearing normal.     Nose: Nose normal. No nasal deformity.     Mouth/Throat:     Lips: Pink.     Tongue: No lesions.     Pharynx: Oropharynx is clear.  Eyes:     General: Lids are normal.     Extraocular Movements: Extraocular movements intact.  Cardiovascular:     Rate and Rhythm: Normal rate and regular rhythm.     Heart sounds: Normal heart sounds.  Pulmonary:     Effort: Pulmonary effort is normal.     Breath sounds: Normal  breath sounds.  Abdominal:     General: Bowel sounds are normal. There is no distension.     Palpations: Abdomen is soft. There is no mass.     Tenderness: There is no abdominal tenderness.  Musculoskeletal:     Right lower leg: No edema.     Left lower leg: No edema.     Comments: Weakness of both lower extremities.  H/o DVT .  Skin:    General: Skin is warm.  Neurological:     General: No focal deficit present.     Mental Status: She is alert and oriented to person, place, and time.     Cranial Nerves: Cranial nerves 2-12 are intact.     Motor: Weakness present.  Psychiatric:        Attention and Perception: Attention normal.        Mood and Affect: Mood normal.        Speech: Speech normal.        Behavior: Behavior normal. Behavior is cooperative.      Labs on Admission: I have personally reviewed following labs and imaging studies  CBC: Recent Labs  Lab 03/29/23 1401  WBC 6.7  HGB 11.5*  HCT 36.5  MCV 84.5  PLT 261   Basic Metabolic Panel: Recent Labs  Lab 03/29/23 1401  NA 140  K 4.2  CL 105  CO2 29  GLUCOSE 97  BUN 27*  CREATININE 0.88  CALCIUM 9.1   GFR: Estimated Creatinine Clearance: 71.9 mL/min (by C-G formula based on SCr of 0.88 mg/dL). Liver Function Tests: No results for input(s): "AST", "ALT", "ALKPHOS", "BILITOT", "PROT", "ALBUMIN" in the last 168 hours.  No results for input(s): "LIPASE", "AMYLASE" in the last 168 hours. No results for input(s): "AMMONIA" in the last 168 hours. Coagulation Profile: No results for input(s): "INR", "PROTIME" in the last 168 hours. Cardiac Enzymes: No results for input(s): "CKTOTAL", "CKMB", "CKMBINDEX", "TROPONINI" in the last 168 hours. BNP (last 3 results) No results for input(s): "PROBNP" in the last 8760 hours. HbA1C: No results for input(s): "HGBA1C" in the last 72 hours. CBG: Recent Labs  Lab 03/29/23 1352  GLUCAP 71   Lipid Profile: No results for input(s): "CHOL", "HDL", "LDLCALC", "TRIG",  "CHOLHDL", "LDLDIRECT" in the last 72 hours. Thyroid Function Tests: No results for input(s): "TSH", "T4TOTAL", "FREET4", "T3FREE", "THYROIDAB" in the last 72 hours. Anemia Panel: No results for input(s): "VITAMINB12", "FOLATE", "FERRITIN", "TIBC", "IRON", "RETICCTPCT" in the last 72 hours. Urine analysis: Urinalysis    Component Value Date/Time   COLORURINE YELLOW (A) 08/22/2022 1540   APPEARANCEUR CLEAR (A) 08/22/2022 1540   APPEARANCEUR Clear 03/22/2014 1620   LABSPEC 1.011 08/22/2022 1540   LABSPEC 1.020 03/22/2014 1620   PHURINE 5.0 08/22/2022 1540   GLUCOSEU 50 (A) 08/22/2022 1540   GLUCOSEU Negative 03/22/2014 1620   HGBUR NEGATIVE 08/22/2022 1540   BILIRUBINUR NEGATIVE 08/22/2022 1540   BILIRUBINUR Negative 03/22/2014 1620   KETONESUR NEGATIVE 08/22/2022 1540   PROTEINUR NEGATIVE 08/22/2022 1540   NITRITE NEGATIVE 08/22/2022 1540   LEUKOCYTESUR LARGE (A) 08/22/2022 1540   LEUKOCYTESUR Negative 03/22/2014 1620    Unresulted Labs (From admission, onward)     Start     Ordered   03/30/23 0500  Comprehensive metabolic panel  Tomorrow morning,   R        03/29/23 1955   03/30/23 0500  CBC  Tomorrow morning,   R        03/29/23 1955   03/29/23 1350  Urinalysis, Routine w reflex microscopic -Urine, Clean Catch  Once,   URGENT       Question:  Specimen Source  Answer:  Urine, Clean Catch   03/29/23 1349             Radiological Exams on Admission: MR BRAIN W WO CONTRAST  Result Date: 03/29/2023 CLINICAL DATA:  right facial droop. EXAM: MRI HEAD WITHOUT AND WITH CONTRAST TECHNIQUE: Multiplanar, multiecho pulse sequences of the brain and surrounding structures were obtained without and with intravenous contrast. CONTRAST:  10mL GADAVIST GADOBUTROL 1 MMOL/ML IV SOLN COMPARISON:  Brain MRI 03/03/2022. FINDINGS: Brain: Peripherally enhancing mass with central nonenhancement along the gray-white junction of the right cingulate gyrus, measuring up to 13 mm (axial image 109  series 20) with surrounding vasogenic edema. No other foci of abnormal enhancement in the brain. No acute infarct or hemorrhage. Mild chronic small-vessel disease. No hydrocephalus or extra-axial collection. Vascular: Normal flow voids and vessel enhancement. Skull and upper cervical spine: Normal marrow signal and enhancement. Sinuses/Orbits: Unremarkable. Other: None. IMPRESSION: Peripherally enhancing mass with central nonenhancement along the gray-white junction of the right cingulate gyrus, measuring up to 13 mm with surrounding vasogenic edema. 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 Electronically Signed   By: [name] M.D.   On: 03/29/2023 18:11     Data Reviewed: Relevant notes from primary care and specialist visits, past discharge summaries as available in EHR, including Care Everywhere. Prior diagnostic testing as pertinent to current admission diagnoses Updated medications and problem lists for reconciliation ED course, including vitals, labs, imaging, treatment and response to treatment Triage notes, nursing and pharmacy notes and ED provider's notes Notable  results as noted in HPI Assessment and Plan: No notes have been filed under this hospital service. Service: Hospitalist       DVT prophylaxis:  ***  Consults:  ***  Advance Care Planning:    Code Status: Full Code   Family Communication:  *** Disposition Plan:  Back to previous home environment  Severity of Illness: {Observation/Inpatient:21159}  Author: Gertha Calkin, MD 03/29/2023 7:56 PM  For on call review [email or web address].

## 2023-03-30 NOTE — TOC Benefit Eligibility Note (Signed)
Pharmacy Patient Advocate Encounter  Insurance verification completed.    The patient is insured through [facility] Medicare Part D    Ran test claim for Eliquis 5 mg and the current 30 day co-pay is $11.20.   This test claim was processed through [facility]- copay amounts may vary at other pharmacies due to pharmacy/plan contracts, or as the patient moves through the different stages of their insurance plan.    Roland Earl, CPHT Pharmacy Patient Advocate Specialist [facility] Patient Advocate Team Direct Number: [phone]  Fax: [phone]

## 2023-03-30 NOTE — Interval H&P Note (Signed)
History and Physical Interval Note:  03/30/2023 3:18 PM  [name]  has presented today for surgery, with the diagnosis of Lower Exttremity DVT.  The various methods of treatment have been discussed with the patient and family. After consideration of risks, benefits and other options for treatment, the patient has consented to  Procedure(s): IVC FILTER INSERTION (N/A) as a surgical intervention.  The patient's history has been reviewed, patient examined, no change in status, stable for surgery.  I have reviewed the patient's chart and labs.  Questions were answered to the patient's satisfaction.     Festus Barren

## 2023-03-30 NOTE — Assessment & Plan Note (Addendum)
Stable anemia. We will follow.

## 2023-03-30 NOTE — Consult Note (Signed)
Hospital Consult    Reason for Consult:  DVT requiring an IVC Filter placement  Requesting Physician:  Dr Phineas Semen MD MRN #:  [id number]  History of Present Illness: This is a 77 y.o. female with medical history significant for history of breast cancer, heart failure, chronic kidney disease presenting with generalized weakness and lethargy since early morning today patient denied any pain and just said that she was very weak all over.  States that her left leg is also very weak and she has not walked very well in a while.  Per report patient feels that she feels difficulty speaking but other than that no headaches currently vision speech or falls.  No chest pain shortness of breath palpitations nausea vomiting diarrhea.   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 Due to the current possible metastatic cancer the patient is not a candidate for Anticoagulation therapy. Vascular Surgery consulted for IVC filter placement. I spoke with patients medical POA and Daughter Zorina Mallin who states the patient does sign her own consents for medical treatment. She is of sound mind but likes me to help guide her in her care.    Past Medical History:  Diagnosis Date   Abnormality of gait    Acute on chronic diastolic CHF (congestive heart failure) (HCC) 11/20/2017   Acute respiratory failure with hypoxia (HCC) 06/13/2022   AKI (acute kidney injury) (HCC) 03/03/2022   Breast cancer (HCC) 02/05/2005   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    Breast cancer (HCC) 2019   right breast   CHF (congestive heart failure) (HCC)    March 28, 2017 echo: EF 50-55%; 1) echo 07/2011: EF 20-25%, mild concentric hypertrophy, diffuse HK, regional WMA cannot be excluded, grade 1 DD, mitral valve with mild regurg, LA mildly dilated). 2) EF 40-45%, mild AS,                      Chronic kidney disease    Chronic kidney disease, stage 3b (HCC) 09/10/2022   Complication of anesthesia 2006   pt states "hard to wake up" after L breast surgery    Edema    GERD (gastroesophageal reflux disease)    Glucose intolerance 09/10/2022   Headache    Heart murmur    Hypertension    Hypertensive emergency 06/13/2022   Hypokinesis    global, EF 35-45 % from Echo.   Lower limb ulcer, calf, left, limited to breakdown of skin (HCC) 12/11/2020   Malignant neoplasm of breast (female), unspecified site 06/14/2018   RIGHT T1b, N0; ER/PR positive, HER-2 negative.   Neuropathy    Obesity    Pain in joint, site unspecified    Personal history of chemotherapy 2006   left breast   Personal history of radiation therapy 2019   right breast   Personal history of radiation therapy 2006   left breast   Pneumonia    Pyuria 08/22/2022   Sleep apnea    Syncope 03/03/2022   Tinea pedis    Unspecified hearing loss    bilateral   Weakness 03/03/2022    Past Surgical History:  Procedure Laterality Date   BREAST BIOPSY Right 2019   11 oc-INVASIVE MAMMARY CARCINOMA, NO SPECIAL TYPE   BREAST BIOPSY Right 2019   1030 oc- neg   BREAST LUMPECTOMY  2006   left  breast    CATARACT EXTRACTION     left eye   HERNIA REPAIR  05/25/2006   Incarcerated omentum in ventral hernia, Ventralight mesh, combined lap/ open with omental resection.    PARTIAL MASTECTOMY WITH NEEDLE LOCALIZATION Right 06/14/2018   Procedure: PARTIAL MASTECTOMY WITH NEEDLE LOCALIZATION;  Surgeon: Earline Mayotte, MD;  Location: [facility] OR[facility];  Service: General;  Laterality: Right;   PORT-A-CATH REMOVAL  05/25/2006   SENTINEL NODE BIOPSY Right 06/14/2018   Procedure: SENTINEL NODE BIOPSY;  Surgeon: Earline Mayotte, MD;  Location: [facility];  Service: General;  Laterality: Right;   TUBAL LIGATION      No Known Allergies  Prior to Admission medications   Medication Sig Start Date End Date Taking?  Authorizing Provider  acetaminophen (TYLENOL) 325 MG tablet Take 2 tablets (650 mg total) by mouth every 6 (six) hours as needed for mild pain (or Fever >/= 101). 09/15/22  Yes Gillis Santa, MD  anastrozole (ARIMIDEX) 1 MG tablet Take 1 tablet (1 mg total) by mouth daily. 01/19/23  Yes Creig Hines, MD  ascorbic acid (VITAMIN C) 500 MG tablet Take 1 tablet by mouth 2 (two) times daily.   Yes [provider]  aspirin EC 81 MG tablet Take 81 mg by mouth daily.   Yes [provider]  bisacodyl (DULCOLAX) 5 MG EC tablet Take 2 tablets (10 mg total) by mouth at bedtime. Skip the dose if no constipation Patient taking differently: Take 10 mg by mouth 2 (two) times daily. Skip the dose if no constipation 09/15/22  Yes Gillis Santa, MD  Calcium Carbonate-Vitamin D 600-5 MG-MCG TABS Take 1 tablet by mouth 2 (two) times daily.   Yes [provider]  cloNIDine (CATAPRES) 0.1 MG tablet Take 1 tablet (0.1 mg total) by mouth 2 (two) times daily as needed (systolic BP > 150 or diastolic BP > 95). 08/23/22  Yes Marrion Coy, MD  diclofenac Sodium (VOLTAREN) 1 % GEL Apply 2 g topically 2 (two) times daily as needed.   Yes [provider]  Emollient (EUCERIN) lotion Apply topically 2 (two) times daily. Apply to dry skin especially bilateral lower extremities and arms. 09/15/22  Yes Gillis Santa, MD  empagliflozin (JARDIANCE) 10 MG TABS tablet Take 1 tablet (10 mg total) by mouth daily before breakfast. 08/23/22  Yes Marrion Coy, MD  ferrous sulfate 325 (65 FE) MG tablet Take 325 mg by mouth every other day. 12/16/22  Yes [provider]  gabapentin (NEURONTIN) 300 MG capsule Take 300 mg by mouth 2 (two) times daily.   Yes [provider]  guaiFENesin (MUCINEX) 600 MG 12 hr tablet Take 600 mg by mouth every 12 (twelve) hours.   Yes [provider]  HYDROcodone-acetaminophen (NORCO/VICODIN) 5-325 MG tablet Take 1 tablet by mouth 3 (three) times daily as needed.    Yes [provider]  ipratropium-albuterol (DUONEB) 0.5-2.5 (3) MG/3ML SOLN Take 3 mLs by nebulization every 4 (four) hours as needed (wheezing). 12/04/22  Yes [provider]  isosorbide mononitrate (IMDUR) 60 MG 24 hr tablet Take 1 tablet (60 mg total) by mouth 2 (two) times daily. 08/23/22  Yes Marrion Coy, MD  loratadine (CLARITIN) 10 MG tablet Take 10 mg by mouth daily. 07/20/19  Yes [provider]  losartan (COZAAR) 50 MG tablet Take 50 mg by mouth daily.   Yes [provider]  omeprazole (PRILOSEC) 40 MG capsule Take 40 mg by mouth daily. 04/13/21  Yes [provider]  oxybutynin (DITROPAN) 5 MG tablet Take 5 mg by mouth 2 (two) times daily.   Yes [provider]  polyethylene glycol (MIRALAX / GLYCOLAX) 17 g packet Take 17 g by mouth daily. Skip the dose if no constipation 09/15/22  Yes Gillis Santa, MD  pregabalin (LYRICA) 75 MG capsule Take 75 mg by mouth daily. 03/19/23  Yes [provider]  sennosides-docusate sodium (SENOKOT-S) 8.6-50 MG tablet Take 1 tablet by mouth 2 (two) times daily.   Yes [provider]  spironolactone (ALDACTONE) 25 MG tablet Take 12.5 mg by mouth daily.   Yes [provider]  traMADol HCl 25 MG TABS Take 1 tablet by mouth every 6 (six) hours as needed. 10/30/22  Yes [provider]  vitamin B-12 (CYANOCOBALAMIN) 500 MCG tablet Take 500 mcg by mouth daily.   Yes [provider]  Vitamin D, Ergocalciferol, (DRISDOL) 1.25 MG (50000 UNIT) CAPS capsule Take 50,000 Units by mouth once a week. 03/23/23  Yes [provider]  Zinc Oxide (TRIPLE PASTE) 12.8 % ointment Apply barrier cream to buttocks every shift.   Yes [provider]  cloNIDine HCl (KAPVAY) 0.1 MG TB12 ER tablet Take 0.1 mg by mouth 2 (two) times daily. PRN Patient not taking: Reported on 03/30/2023    [provider]  furosemide (LASIX) 20 MG tablet Take 20 mg by mouth daily. Patient  not taking: Reported on 03/06/2023 12/24/22   [provider]  Incontinence Supply Disposable (BLADDER CONTROL PADS EX ABSORB) MISC Apply 1 Dose topically as needed.  03/22/18   [provider]    Social History   Socioeconomic History   Marital status: Divorced    Spouse name: Not on file   Number of children: Not on file   Years of education: Not on file   Highest education level: Not on file  Occupational History   Occupation: retired  Tobacco Use   Smoking status: Former    Current packs/day: 0.00    Average packs/day: 0.5 packs/day for 30.0 years (15.0 ttl pk-yrs)    Types: Cigarettes    Start date: 07/14/1970    Quit date: 07/14/2000    Years since quitting: 22.7   Smokeless tobacco: Never  Vaping Use   Vaping status: Never Used  Substance and Sexual Activity   Alcohol use: No   Drug use: Not Currently    Types: Marijuana    Comment: endorses prior marijuana use 1999   Sexual activity: Never  Other Topics Concern   Not on file  Social History Narrative   Not on file   Social Determinants of Health   Financial Resource Strain: Not on file  Food Insecurity: No Food Insecurity (09/11/2022)   Hunger Vital Sign    Worried About Running Out of Food in the Last Year: Never true    Ran Out of Food in the Last Year: Never true  Transportation Needs: No Transportation Needs (09/11/2022)   PRAPARE - Administrator, Civil Service (Medical): No    Lack of Transportation (Non-Medical): No  Physical Activity: Not on file  Stress: Not on file  Social Connections: Not on file  Intimate Partner Violence: Not At Risk (09/11/2022)   Humiliation, Afraid, Rape, and Kick questionnaire    Fear of Current or Ex-Partner: No    Emotionally Abused: No    Physically Abused: No    Sexually Abused: No     Family History  Problem Relation Age of Onset   Hypertension  Mother        deceased 87   Cataracts Mother    Breast cancer Other 29       maternal  half-sister; deceased 33   Coronary artery disease Other     ROS: Otherwise negative unless mentioned in HPI  Physical Examination  Vitals:   03/30/23 0800 03/30/23 0954  BP: (!) 156/94   Pulse: 77   Resp: 13   Temp:  98 F (36.7 C)  SpO2: 98%    Body mass index is 36.92 kg/m.  General:  WDWN in NAD Gait: Not observed HENT: WNL, normocephalic Pulmonary: normal non-labored breathing, without Rales, rhonchi,  wheezing Cardiac: , without  Murmurs, rubs or gallops; No  carotid bruits Abdomen: Positive bowel sounds throughout, soft, NT/ND, no masses Skin: without rashes Vascular Exam/Pulses: Palpable pulses throughout with bilateral +1 edema.  Extremities: without ischemic changes, without Gangrene , without cellulitis; without open wounds;  Musculoskeletal: no muscle wasting or atrophy  Neurologic: A&O X 3;  No focal weakness or paresthesias are detected; speech is fluent/normal Psychiatric:  The pt has Normal affect. Lymph:  Unremarkable  CBC    Component Value Date/Time   WBC 5.8 03/30/2023 0542   RBC 4.35 03/30/2023 0542   HGB 11.4 (L) 03/30/2023 0542   HGB 11.8 (L) 03/23/2014 0518   HCT 36.0 03/30/2023 0542   HCT 32.8 (L) 03/22/2014 0403   PLT 277 03/30/2023 0542   PLT 252 03/23/2014 0518   MCV 82.8 03/30/2023 0542   MCV 88 03/22/2014 0403   MCH 26.2 03/30/2023 0542   MCHC 31.7 03/30/2023 0542   RDW 16.3 (H) 03/30/2023 0542   RDW 15.0 (H) 03/22/2014 0403   LYMPHSABS 1.3 08/22/2022 1402   LYMPHSABS 1.1 03/22/2014 0403   MONOABS 0.7 08/22/2022 1402   MONOABS 0.7 03/22/2014 0403   EOSABS 0.1 08/22/2022 1402   EOSABS 0.0 03/22/2014 0403   BASOSABS 0.1 08/22/2022 1402   BASOSABS 0.0 03/22/2014 0403    BMET    Component Value Date/Time   NA 140 03/30/2023 0542   NA 142 07/04/2021 1503   NA 143 03/22/2014 0403   K 4.2 03/30/2023 0542   K 3.4 (L) 03/22/2014 0403   CL 103 03/30/2023 0542   CL 107 03/22/2014 0403   CO2 29 03/30/2023 0542   CO2 32  03/22/2014 0403   GLUCOSE 137 (H) 03/30/2023 0542   GLUCOSE 88 03/22/2014 0403   BUN 27 (H) 03/30/2023 0542   BUN 27 07/04/2021 1503   BUN 17 03/22/2014 0403   CREATININE 0.87 03/30/2023 0542   CREATININE 0.99 03/22/2014 0403   CALCIUM 8.8 (L) 03/30/2023 0542   CALCIUM 8.2 (L) 03/22/2014 0403   GFRNONAA >60 03/30/2023 0542   GFRNONAA 59 (L) 03/22/2014 0403   GFRAA 42 (L) 02/10/2020 1016   GFRAA >60 03/22/2014 0403    COAGS: Lab Results  Component Value Date   INR 1.07 03/28/2017   INR 1.1 03/22/2014     Non-Invasive Vascular Imaging:   EXAM:03/30/23 BILATERAL LOWER EXTREMITY VENOUS DOPPLER ULTRASOUND   TECHNIQUE: Gray-scale sonography with graded compression, as well as color Doppler and duplex ultrasound were performed to evaluate the lower extremity deep venous systems from the level of the common femoral vein and including the common femoral, femoral, profunda femoral, popliteal and calf veins including the posterior tibial, peroneal and gastrocnemius veins when visible. The superficial great saphenous vein was also interrogated. Spectral Doppler was utilized to evaluate flow at rest and with distal augmentation  maneuvers in the common femoral, femoral and popliteal veins.   COMPARISON:  None Available.   FINDINGS: RIGHT LOWER EXTREMITY   Common Femoral Vein: No evidence of thrombus. Normal compressibility, respiratory phasicity and response to augmentation.   Saphenofemoral Junction: No evidence of thrombus. Normal compressibility and flow on color Doppler imaging.   Profunda Femoral Vein: No evidence of thrombus. Normal compressibility and flow on color Doppler imaging.   Femoral Vein: No evidence of thrombus. Normal compressibility, respiratory phasicity and response to augmentation.   Popliteal Vein: No evidence of thrombus. Normal compressibility, respiratory phasicity and response to augmentation.   Calf Veins: Thrombus is noted in the posterior  tibial vein. The peroneal vein is not well visualized.   Superficial Great Saphenous Vein: No evidence of thrombus. Normal compressibility.   Venous Reflux:  None.   Other Findings:  None.   LEFT LOWER EXTREMITY   Common Femoral Vein: No evidence of thrombus. Normal compressibility, respiratory phasicity and response to augmentation.   Saphenofemoral Junction: No evidence of thrombus. Normal compressibility and flow on color Doppler imaging.   Profunda Femoral Vein: No evidence of thrombus. Normal compressibility and flow on color Doppler imaging.   Femoral Vein: No evidence of thrombus. Normal compressibility, respiratory phasicity and response to augmentation.   Popliteal Vein: No evidence of thrombus. Normal compressibility, respiratory phasicity and response to augmentation.   Calf Veins: Calf veins are not well visualized.   Superficial Great Saphenous Vein: No evidence of thrombus. Normal compressibility.   Venous Reflux:  None.   Other Findings:  None.   IMPRESSION: Thrombus noted in the right posterior tibial vein. No other focal abnormality is noted.  Statin:  No. Beta Blocker:  No. Aspirin:  Yes.   ACEI:  No. ARB:  Yes.   CCB use:  No Other antiplatelets/anticoagulants:  No.    ASSESSMENT/PLAN: This is a 77 y.o. female who presents to [facility] ER with overall generalized weakness. Upon workup she was found to have a 13 mm brain mass which may be metastatic breast cancer. She has a history of breast cancer. She is not a candidate for anticoagulation due to her medical history. Vascular Surgery consulted for endovascular placement of an Inferior Vena Cava Filter.   PLAN: Vascular surgery plans on taking the patient to the vascular lab for IVC filter placement later today 03/30/23. I discussed in detail with the patient and her daughter Jenny Lai her POA over the phone, the procedure, benefits, risks and complications. Both verbalized their understanding.  Both would like to proceed later today. I answered all questions today. Patient remains NPO after midnight for procedure.     -I discussed the plan in detail with Dr Festus Barren MD and he agrees with the plan.    Marcie Bal [facility] 03/30/2023 10:40 AM

## 2023-03-30 NOTE — Consult Note (Signed)
Consulting Department:  Inpatient Medicine  Primary Physician:  Emogene [name], MD  Chief Complaint:  left Facial Weakness, new brain mass  History of Present Illness: 03/30/2023 [name] is a 77 y.o. female who presents with the chief complaint of left sided facial weakness, with new brain mass.  She has a history of breast cancer.  Has had previous radiation.  Developed left-sided facial weakness and slurring of her voice approximately 1 to 2 weeks ago.  Also felt some heaviness in her left upper and lower extremity.  Lower extremity has always had some level of weakness.  She has not had any seizures.  Not had any headaches.  She feels that her symptoms have been stable over the past few weeks.  On workup she was found to have an MRI positive for a intracranial lesion.  Neurosurgery was consulted.   Review of Systems:  A 10 point review of systems is negative, except for the pertinent positives and negatives detailed in the HPI.  Past Medical History: Past Medical History:  Diagnosis Date   Abnormality of gait    Acute on chronic diastolic CHF (congestive heart failure) (HCC) 11/20/2017   Acute respiratory failure with hypoxia (HCC) 06/13/2022   AKI (acute kidney injury) (HCC) 03/03/2022   Breast cancer (HCC) 02/05/2005   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    Breast cancer (HCC) 2019   right breast   CHF (congestive heart failure) (HCC)    March 28, 2017 echo: EF 50-55%; 1) echo 07/2011: EF 20-25%, mild concentric hypertrophy, diffuse HK, regional WMA cannot be excluded, grade 1 DD, mitral valve with mild regurg, LA mildly dilated). 2) EF 40-45%, mild AS,                     Chronic kidney disease    Chronic kidney disease, stage 3b (HCC) 09/10/2022   Complication of anesthesia 2006   pt states "hard to wake up" after L breast surgery    Edema    GERD (gastroesophageal reflux disease)     Glucose intolerance 09/10/2022   Headache    Heart murmur    Hypertension    Hypertensive emergency 06/13/2022   Hypokinesis    global, EF 35-45 % from Echo.   Lower limb ulcer, calf, left, limited to breakdown of skin (HCC) 12/11/2020   Malignant neoplasm of breast (female), unspecified site 06/14/2018   RIGHT T1b, N0; ER/PR positive, HER-2 negative.   Neuropathy    Obesity    Pain in joint, site unspecified    Personal history of chemotherapy 2006   left breast   Personal history of radiation therapy 2019   right breast   Personal history of radiation therapy 2006   left breast   Pneumonia    Pyuria 08/22/2022   Sleep apnea    Syncope 03/03/2022   Tinea pedis    Unspecified hearing loss    bilateral   Weakness 03/03/2022    Past Surgical History: Past Surgical History:  Procedure Laterality Date   BREAST BIOPSY Right 2019   11 oc-INVASIVE MAMMARY CARCINOMA, NO SPECIAL TYPE   BREAST BIOPSY Right 2019   1030 oc- neg   BREAST LUMPECTOMY  2006   left breast    CATARACT EXTRACTION     left eye   HERNIA REPAIR  05/25/2006   Incarcerated omentum in ventral hernia, Ventralight mesh, combined lap/ open with omental resection.  PARTIAL MASTECTOMY WITH NEEDLE LOCALIZATION Right 06/14/2018   Procedure: PARTIAL MASTECTOMY WITH NEEDLE LOCALIZATION;  Surgeon: Earline Mayotte, MD;  Location: [facility];  Service: General;  Laterality: Right;   PORT-A-CATH REMOVAL  05/25/2006   SENTINEL NODE BIOPSY Right 06/14/2018   Procedure: SENTINEL NODE BIOPSY;  Surgeon: Earline Mayotte, MD;  Location: [facility];  Service: General;  Laterality: Right;   TUBAL LIGATION      Allergies: Allergies as of 03/29/2023   (No Known Allergies)    Medications:  Current Facility-Administered Medications:    0.9 %  sodium chloride infusion, , Intravenous, Continuous, Gertha Calkin, MD, Last Rate: 20 mL/hr at 03/30/23 0520, Rate Verify at 03/30/23 0520   acetaminophen (TYLENOL) tablet 650 mg, 650  mg, Oral, Q6H PRN **OR** acetaminophen (TYLENOL) suppository 650 mg, 650 mg, Rectal, Q6H PRN, Gertha Calkin, MD   anastrozole (ARIMIDEX) tablet 1 mg, 1 mg, Oral, Daily, Irena Cords V, MD, 1 mg at 03/30/23 1119   dexamethasone (DECADRON) injection 4 mg, 4 mg, Intravenous, Q6H, Gertha Calkin, MD, 4 mg at 03/30/23 0545   heparin injection 5,000 Units, 5,000 Units, Subcutaneous, Q8H, Gertha Calkin, MD, 5,000 Units at 03/30/23 0245   HYDROcodone-acetaminophen (NORCO/VICODIN) 5-325 MG per tablet 1 tablet, 1 tablet, Oral, TID PRN, Gertha Calkin, MD   ipratropium-albuterol (DUONEB) 0.5-2.5 (3) MG/3ML nebulizer solution 3 mL, 3 mL, Nebulization, Q6H PRN, Gertha Calkin, MD   isosorbide mononitrate (IMDUR) 24 hr tablet 60 mg, 60 mg, Oral, BID, Irena Cords V, MD, 60 mg at 03/30/23 1119   levETIRAcetam (KEPPRA) IVPB 500 mg/100 mL premix, 500 mg, Intravenous, Q12H, Phineas Semen, MD, Stopped at 03/30/23 0754   losartan (COZAAR) tablet 50 mg, 50 mg, Oral, Daily, Irena Cords V, MD, 50 mg at 03/30/23 1119   morphine (PF) 2 MG/ML injection 2 mg, 2 mg, Intravenous, Q4H PRN, Gertha Calkin, MD   oxybutynin (DITROPAN) tablet 5 mg, 5 mg, Oral, BID, Irena Cords V, MD, 5 mg at 03/30/23 1119   sodium chloride flush (NS) 0.9 % injection 3 mL, 3 mL, Intravenous, Q12H, Gertha Calkin, MD  Current Outpatient Medications:    acetaminophen (TYLENOL) 325 MG tablet, Take 2 tablets (650 mg total) by mouth every 6 (six) hours as needed for mild pain (or Fever >/= 101)., Disp: , Rfl:    anastrozole (ARIMIDEX) 1 MG tablet, Take 1 tablet (1 mg total) by mouth daily., Disp: 30 tablet, Rfl: 6   ascorbic acid (VITAMIN C) 500 MG tablet, Take 1 tablet by mouth 2 (two) times daily., Disp: , Rfl:    aspirin EC 81 MG tablet, Take 81 mg by mouth daily., Disp: , Rfl:    bisacodyl (DULCOLAX) 5 MG EC tablet, Take 2 tablets (10 mg total) by mouth at bedtime. Skip the dose if no constipation (Patient taking differently: Take 10 mg by mouth 2 (two)  times daily. Skip the dose if no constipation), Disp: 30 tablet, Rfl: 0   Calcium Carbonate-Vitamin D 600-5 MG-MCG TABS, Take 1 tablet by mouth 2 (two) times daily., Disp: , Rfl:    cloNIDine (CATAPRES) 0.1 MG tablet, Take 1 tablet (0.1 mg total) by mouth 2 (two) times daily as needed (systolic BP > 150 or diastolic BP > 95)., Disp: 60 tablet, Rfl: 0   diclofenac Sodium (VOLTAREN) 1 % GEL, Apply 2 g topically 2 (two) times daily as needed., Disp: , Rfl:    Emollient (EUCERIN) lotion, Apply topically 2 (two) times  daily. Apply to dry skin especially bilateral lower extremities and arms., Disp: 240 mL, Rfl: 0   empagliflozin (JARDIANCE) 10 MG TABS tablet, Take 1 tablet (10 mg total) by mouth daily before breakfast., Disp: 30 tablet, Rfl: 0   ferrous sulfate 325 (65 FE) MG tablet, Take 325 mg by mouth every other day., Disp: , Rfl:    gabapentin (NEURONTIN) 300 MG capsule, Take 300 mg by mouth 2 (two) times daily., Disp: , Rfl:    guaiFENesin (MUCINEX) 600 MG 12 hr tablet, Take 600 mg by mouth every 12 (twelve) hours., Disp: , Rfl:    HYDROcodone-acetaminophen (NORCO/VICODIN) 5-325 MG tablet, Take 1 tablet by mouth 3 (three) times daily as needed., Disp: , Rfl:    ipratropium-albuterol (DUONEB) 0.5-2.5 (3) MG/3ML SOLN, Take 3 mLs by nebulization every 4 (four) hours as needed (wheezing)., Disp: , Rfl:    isosorbide mononitrate (IMDUR) 60 MG 24 hr tablet, Take 1 tablet (60 mg total) by mouth 2 (two) times daily., Disp: 60 tablet, Rfl: 0   loratadine (CLARITIN) 10 MG tablet, Take 10 mg by mouth daily., Disp: , Rfl:    losartan (COZAAR) 50 MG tablet, Take 50 mg by mouth daily., Disp: , Rfl:    omeprazole (PRILOSEC) 40 MG capsule, Take 40 mg by mouth daily., Disp: , Rfl:    oxybutynin (DITROPAN) 5 MG tablet, Take 5 mg by mouth 2 (two) times daily., Disp: , Rfl:    polyethylene glycol (MIRALAX / GLYCOLAX) 17 g packet, Take 17 g by mouth daily. Skip the dose if no constipation, Disp: 14 each, Rfl: 0    pregabalin (LYRICA) 75 MG capsule, Take 75 mg by mouth daily., Disp: , Rfl:    sennosides-docusate sodium (SENOKOT-S) 8.6-50 MG tablet, Take 1 tablet by mouth 2 (two) times daily., Disp: , Rfl:    spironolactone (ALDACTONE) 25 MG tablet, Take 12.5 mg by mouth daily., Disp: , Rfl:    traMADol HCl 25 MG TABS, Take 1 tablet by mouth every 6 (six) hours as needed., Disp: , Rfl:    vitamin B-12 (CYANOCOBALAMIN) 500 MCG tablet, Take 500 mcg by mouth daily., Disp: , Rfl:    Vitamin D, Ergocalciferol, (DRISDOL) 1.25 MG (50000 UNIT) CAPS capsule, Take 50,000 Units by mouth once a week., Disp: , Rfl:    Zinc Oxide (TRIPLE PASTE) 12.8 % ointment, Apply barrier cream to buttocks every shift., Disp: , Rfl:    cloNIDine HCl (KAPVAY) 0.1 MG TB12 ER tablet, Take 0.1 mg by mouth 2 (two) times daily. PRN (Patient not taking: Reported on 03/30/2023), Disp: , Rfl:    furosemide (LASIX) 20 MG tablet, Take 20 mg by mouth daily. (Patient not taking: Reported on 03/06/2023), Disp: , Rfl:    Incontinence Supply Disposable (BLADDER CONTROL PADS EX ABSORB) MISC, Apply 1 Dose topically as needed. , Disp: , Rfl:    Social History: Social History   Tobacco Use   Smoking status: Former    Current packs/day: 0.00    Average packs/day: 0.5 packs/day for 30.0 years (15.0 ttl pk-yrs)    Types: Cigarettes    Start date: 07/14/1970    Quit date: 07/14/2000    Years since quitting: 22.7   Smokeless tobacco: Never  Vaping Use   Vaping status: Never Used  Substance Use Topics   Alcohol use: No   Drug use: Not Currently    Types: Marijuana    Comment: endorses prior marijuana use 1999    Family Medical History: Family History  Problem  Relation Age of Onset   Hypertension Mother        deceased 3   Cataracts Mother    Breast cancer Other 10       maternal half-sister; deceased 61   Coronary artery disease Other     Physical Examination: Vitals:   03/30/23 0800 03/30/23 0954  BP: (!) 156/94   Pulse: 77   Resp: 13    Temp:  98 F (36.7 C)  SpO2: 98%      General: Patient is well developed, well nourished, calm, collected, and in no apparent distress.  NEUROLOGICAL:  General: In no acute distress.   Awake, alert, oriented to person, place, and time.  Pupils equal round and reactive to light.  Left-sided facial weakness to activation, she does have very noticeable dysarthria which family states is a change from her baseline a month ago.   she does have some slight hesitation in the left upper and lower extremity.  She states that her left lower extremity hesitation has always been present.  There is no pronator drift.  Language is conversant.  GCS:15   Bilateral upper and lower extremity sensation is intact to light touch.  Imaging: Narrative & Impression  CLINICAL DATA:  right facial droop.   EXAM: MRI HEAD WITHOUT AND WITH CONTRAST   TECHNIQUE: Multiplanar, multiecho pulse sequences of the brain and surrounding structures were obtained without and with intravenous contrast.   CONTRAST:  10mL GADAVIST GADOBUTROL 1 MMOL/ML IV SOLN   COMPARISON:  Brain MRI 03/03/2022.   FINDINGS: Brain: Peripherally enhancing mass with central nonenhancement along the gray-white junction of the right cingulate gyrus, measuring up to 13 mm (axial image 109 series 20) with surrounding vasogenic edema. No other foci of abnormal enhancement in the brain.   No acute infarct or hemorrhage. Mild chronic small-vessel disease. No hydrocephalus or extra-axial collection.   Vascular: Normal flow voids and vessel enhancement.   Skull and upper cervical spine: Normal marrow signal and enhancement.   Sinuses/Orbits: Unremarkable.   Other: None.   IMPRESSION: Peripherally enhancing mass with central nonenhancement along the gray-white junction of the right cingulate gyrus, measuring up to 13 mm with surrounding vasogenic edema. 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     Electronically Signed    By: [name] M.D.   On: 03/29/2023 18:11     I have personally reviewed the images and agree with the above interpretation.  Labs:    Latest Ref Rng & Units 03/30/2023    5:42 AM 03/29/2023    2:01 PM 09/16/2022    4:39 AM  CBC  WBC 4.0 - 10.5 K/uL 5.8  6.7  7.9   Hemoglobin 12.0 - 15.0 g/dL 28.4  13.2  9.1   Hematocrit 36.0 - 46.0 % 36.0  36.5  29.2   Platelets 150 - 400 K/uL 277  261  283        Assessment and Plan: Brain Lesion Facial weakness Metastatic Breast Cancer  [name] is a pleasant 77 y.o. female with breast cancer.  She presents with a right-sided paramedian enhancing lesion consistent with a metastatic deposit from her known breast cancer.  This was likely the cause of her left-sided facial weakness and dysarthria.  She has not had any seizures.  No hemorrhage.  No headaches.  Has not had any progressive worsening of her dysarthria or facial weakness.  The lesion is quite small measuring approximately 13 mm.  Will defer treatment to medical and radiation oncology  as this is likely too small for indication for surgical resection.  Also given her deficit is likely an eloquent motor cortex.  Will continue to be available should any surgical consultation be needed.  Please do not hesitate to reach out.  At this time she can follow-up as an outpatient as needed.  I have discussed the condition with the patient, including showing the radiographs and discussing treatment options in layman's terms.    Lovenia Kim, MD/MSCR Dept. of Neurosurgery

## 2023-03-30 NOTE — Progress Notes (Signed)
Progress Note   Patient: [name] ZOX:[id number] DOB: [birth year] DOA: 03/29/2023     0 DOS: the patient was seen and examined on 03/30/2023   Brief hospital course:  Charletta Voight is a 77 y.o. female with medical history significant for history of breast cancer, heart failure, chronic kidney disease presenting with generalized weakness and lethargy since early morning today. . Per report patient feels that she feels difficulty speaking but other than that no headaches currently vision speech or falls.   ED course.  Vitals and labs mostly stable.  D-dimer elevated 1.20 MRI brain with peripherally enhancing mass with central nonenhancement along with grade-white junction at the right cingulate gyrus, with surrounding vasogenic edema.  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 CT chest, abdomen and pelvis with no acute abnormality.  Noted to have indeterminate 2 cm lesion from the lateral mid to lower pole of right kidney, and nonemergent MRI was suggested Lower extremity venous Doppler was positive for a thrombus in right posterior tibial vein.  Neurosurgery was consulted and she was started on Decadron and Keppra.  7/22: Vitals with mildly elevated blood pressure.  Vascular surgery was consulted for IVC filter placement as there was concern of high risk for intracranial bleeding with this new diagnosis of mass.  Discussed with neurosurgery and oncology, patient is not a candidate for biopsy due to the location of brain lesion.  Neurosurgery and oncology is okay to start her on anticoagulation. They are recommending discharge on Decadron and Keppra with a close outpatient follow-up with oncology and radiation oncology.  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 In August 2019 she was diagnosed with stage I right breast cancer which was ER/PR positive.   Patient was on Arimidex which she was not taking it for approximately 5 months while at facility. Recent mammogram was without any significant abnormality.     Assessment and Plan: * Neoplasm of brain causing mass 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 structures [facility]) Patient with history of breast cancer, found to have mass with vasogenic edema. Concern of metastatic disease.  Not amenable for biopsy due to the location per neurosurgery. Oncology and radiation oncology was consulted and they were recommending continue Decadron and Keppra and they will follow-up as outpatient within a week  Mouth droop due to facial weakness Pt is Alert/awake and oriented.  Resolved on my exam.    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  Radiation oncology and oncology is on board and they will follow-up as outpatient.  Acute DVT of right tibial vein (HCC) Not sure if this is acute pt is high risk for DVT and also has had DVT in left leg in past.  Patient was found to have DVT involving right lower extremity. -Vascular surgery was consulted to place IVC filter. -Will start on Eliquis without any loading dose after discussing with neurosurgery and oncology .   Chronic systolic CHF (congestive heart failure) (HCC) EF low normal on the most recent echo done in June 2023 Compensated and stable.  No LE edema / SOB. Imdur  and losartan continued.    Benign essential HTN -Continue losartan and Imdur.     Stage 3a chronic kidney disease (HCC) Stable.  -Monitor renal function -Avoid nephrotoxins    Anemia of chronic disease Stable anemia. We will follow.       Subjective: Patient was feeling weak and hungry when  seen today.  Worsening generalized weakness and history of recent multiple falls.  Physical Exam: Vitals:   03/30/23 1554 03/30/23 1559 03/30/23 1604 03/30/23 1609  BP: (!) 191/110 (!) 177/108 (!) 161/104 (!) 161/104  Pulse: 95 83 (!) 105 (!) 0  Resp: 19 18 16    Temp:       TempSrc:      SpO2: 98% 97% 95%   Weight:      Height:       General.  Frail elderly lady, in no acute distress. Pulmonary.  Lungs clear bilaterally, normal respiratory effort. CV.  Regular rate and rhythm, no JVD, rub or murmur. Abdomen.  Soft, nontender, nondistended, BS positive. CNS.  Alert and oriented .  No focal neurologic deficit. Extremities.  No edema, no cyanosis, pulses intact and symmetrical.   Data Reviewed: Prior data reviewed  Family Communication: Discussed with daughter and DIL at bedside  Disposition: Status is: Inpatient Remains inpatient appropriate because: Severity of illness  Planned Discharge Destination: Home with Home Health  DVT prophylaxis.  Eliquis Time spent: 50 minutes  This record has been created using Conservation officer, historic buildings. Errors have been sought and corrected,but may not always be located. Such creation errors do not reflect on the standard of care.   Author: Arnetha Courser, MD 03/30/2023 4:51 PM  For on call review [email or web address].

## 2023-03-30 NOTE — Assessment & Plan Note (Signed)
Patient with history of breast cancer, found to have mass with vasogenic edema. Concern of metastatic disease.  Not amenable for biopsy due to the location per neurosurgery. Oncology and radiation oncology was consulted and they were recommending continue Decadron and Keppra and they will follow-up as outpatient within a week

## 2023-03-30 NOTE — Assessment & Plan Note (Deleted)
Patient was found to have DVT involving right lower extremity. -Vascular surgery was consulted to place IVC filter. -Will start on Eliquis without any loading dose after discussing with neurosurgery and oncology

## 2023-03-30 NOTE — Op Note (Signed)
[facility]   OPERATIVE NOTE    PRE-OPERATIVE DIAGNOSIS: DVT, brain metastasis  POST-OPERATIVE DIAGNOSIS: same as above  PROCEDURE: 1.   Ultrasound guidance for vascular access to the right femoral vein 2.   Catheter placement into the inferior vena cava 3.   Inferior venacavogram 4.   Placement of a Option Elite IVC filter  SURGEON: Festus Barren, MD  ASSISTANT(S): None  ANESTHESIA: local with Moderate Conscious Sedation for approximately 15 minutes using 2 mg of Versed and 50 mcg of Fentanyl  ESTIMATED BLOOD LOSS: minimal  CONTRAST: 15 cc  FLUORO TIME: less than one minute  FINDING(S): 1.  Patent IVC  SPECIMEN(S):  none  INDICATIONS:   [name] is a 77 y.o. female who presents with a DVT and brain metastasis precluding anticoagulation.  Inferior vena cava filter is indicated for this reason.  Risks and benefits including filter thrombosis, migration, fracture, bleeding, and infection were all discussed.  We discussed that all IVC filters that we place can be removed if desired from the patient once the need for the filter has passed.    DESCRIPTION: After obtaining full informed written consent, the patient was brought back to the vascular suite. The skin was sterilely prepped and draped in a sterile surgical field was created. Moderate conscious sedation was administered during a face to face encounter with the patient throughout the procedure with my supervision of the RN administering medicines and monitoring the patient's vital signs, pulse oximetry, telemetry and mental status throughout from the start of the procedure until the patient was taken to the recovery room. The right femoral vein was accessed under direct ultrasound guidance without difficulty with a Seldinger needle and a J-wire was then placed. After skin nick and dilatation, the delivery sheath was placed into the inferior vena cava and an inferior venacavogram was performed. This  demonstrated a patent IVC with the level of the renal veins at the top of L2.  The filter was then deployed into the inferior vena cava at the level of the L2-L3 interspace just below the renal veins. The delivery sheath was then removed. Pressure was held. Sterile dressings were placed. The patient tolerated the procedure well and was taken to the recovery room in stable condition.  COMPLICATIONS: None  CONDITION: Stable  Festus Barren  03/30/2023, 4:04 PM   This note was created with Dragon Medical transcription system. Any errors in dictation are purely unintentional.

## 2023-03-30 NOTE — Assessment & Plan Note (Addendum)
EF low normal on the most recent echo done in June 2023 Compensated and stable.  No LE edema / SOB. Imdur  and losartan continued.

## 2023-03-31 ENCOUNTER — Other Ambulatory Visit: Payer: Self-pay

## 2023-03-31 ENCOUNTER — Encounter: Payer: Self-pay | Admitting: Vascular Surgery

## 2023-03-31 DIAGNOSIS — I5022 Chronic systolic (congestive) heart failure: Secondary | ICD-10-CM

## 2023-03-31 DIAGNOSIS — R079 Chest pain, unspecified: Secondary | ICD-10-CM

## 2023-03-31 DIAGNOSIS — C50911 Malignant neoplasm of unspecified site of right female breast: Secondary | ICD-10-CM | POA: Diagnosis not present

## 2023-03-31 DIAGNOSIS — I82441 Acute embolism and thrombosis of right tibial vein: Secondary | ICD-10-CM | POA: Diagnosis not present

## 2023-03-31 DIAGNOSIS — D496 Neoplasm of unspecified behavior of brain: Secondary | ICD-10-CM | POA: Diagnosis not present

## 2023-03-31 MED ORDER — HYDROCODONE-ACETAMINOPHEN 5-325 MG PO TABS: 1.0000 | ORAL_TABLET | Freq: Three times a day (TID) | ORAL | 0 refills | Status: DC | PRN

## 2023-03-31 MED ORDER — DEXAMETHASONE SODIUM PHOSPHATE 10 MG/ML IJ SOLN: 4.0000 mg | Freq: Two times a day (BID) | INTRAMUSCULAR | Status: DC

## 2023-03-31 MED ORDER — SPIRONOLACTONE 12.5 MG HALF TABLET
12.5000 mg | ORAL_TABLET | Freq: Every day | ORAL | Status: DC
  Administered 2023-03-31: 12.5 mg via ORAL
  Filled 2023-03-31: qty 1

## 2023-03-31 MED ORDER — PREGABALIN 75 MG PO CAPS
75.0000 mg | ORAL_CAPSULE | Freq: Every day | ORAL | Status: DC
  Administered 2023-03-31: 75 mg via ORAL
  Filled 2023-03-31: qty 1

## 2023-03-31 MED ORDER — LEVETIRACETAM 500 MG PO TABS: 500.0000 mg | ORAL_TABLET | Freq: Two times a day (BID) | ORAL | Status: DC

## 2023-03-31 MED ORDER — TRAMADOL HCL 25 MG PO TABS: 1.0000 | ORAL_TABLET | Freq: Four times a day (QID) | ORAL | 0 refills | Status: DC | PRN

## 2023-03-31 MED ORDER — ONDANSETRON HCL 4 MG/2ML IJ SOLN: 4.0000 mg | Freq: Four times a day (QID) | INTRAMUSCULAR | Status: DC | PRN

## 2023-03-31 MED ORDER — DEXAMETHASONE 4 MG PO TABS: 4.0000 mg | ORAL_TABLET | Freq: Two times a day (BID) | ORAL | Status: DC

## 2023-03-31 NOTE — TOC Initial Note (Signed)
Transition of Care [facility]) - Initial/Assessment Note    Patient Details  Name: [name] MRN: [id number] Date of Birth: [birth year]  Transition of Care [facility]) CM/SW Contact:    Truddie Hidden, RN Phone Number: 03/31/2023, 10:05 AM  Clinical Narrative:                 Patient is a LTC resident from [facility].  Attempt to reach Tammy, AC at [facility] regarding potential discharge for today. No answer. Left a message requesting confirmation of patient's return today. MD notified.        Patient Goals and CMS Choice            Expected Discharge Plan and Services                                              Prior Living Arrangements/Services                       Activities of Daily Living Home Assistive Devices/Equipment: Wheelchair ADL Screening (condition at time of admission) Is the patient deaf or have difficulty hearing?: No Does the patient have difficulty seeing, even when wearing glasses/contacts?: Yes Does the patient have difficulty concentrating, remembering, or making decisions?: No Patient able to express need for assistance with ADLs?: Yes Does the patient have difficulty dressing or bathing?: Yes Independently performs ADLs?: Yes (appropriate for developmental age) Does the patient have difficulty walking or climbing stairs?: No Weakness of Legs: Both Weakness of Arms/Hands: None  Permission Sought/Granted                  Emotional Assessment              Admission diagnosis:  Brain mass [G93.89] Mouth droop due to facial weakness [R29.810] Patient Active Problem List   Diagnosis Date Noted   Acute DVT of right tibial vein (HCC) 03/30/2023   Facial weakness 03/30/2023   DVT (deep venous thrombosis) (HCC) 03/30/2023   Mouth droop due to facial weakness 03/29/2023   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) 03/29/2023   Cellulitis of lower extremity 09/10/2022   Near syncope 08/22/2022    Constipation 08/22/2022   Stage 3a chronic kidney disease (HCC) 06/13/2022   Ulcers of both lower extremities, with unspecified severity (HCC) 03/03/2022   Class II obesity 03/03/2022   Anemia of chronic disease 03/03/2022   PAD (peripheral artery disease) (HCC) 05/30/2020   Malignant neoplasm of right female breast (HCC) 05/13/2018   Venous insufficiency of both lower extremities 11/20/2017   Weight gain 11/20/2017   Lymphedema 04/19/2017   Benign essential HTN 04/19/2017   OSA (obstructive sleep apnea) 04/19/2017   Morbid obesity (HCC) 04/19/2017   TIA (transient ischemic attack) 03/28/2017   Urinary incontinence 06/27/2016   Chronic systolic CHF (congestive heart failure) (HCC) 04/20/2013   Breast cancer (HCC) 09/08/2004   PCP:  Emogene [name], MD Pharmacy:   [phone] - Closed - [place], [place] - [address] [place] [address] Phone: [phone] Fax: [phone]  [facility] - [place], [place] - [address] [place] [address] Phone: [phone] Fax: [phone]  [id number] - [place], [place] - [address] [place] [address] Phone:  [phone] Fax: [phone]  [facility] [address] ([address]), [place] - [address] [place] ([address]) [place] [address] Phone: [phone] Fax: [phone]  [facility] [place] [place], [place] - [address] [place] [address] Phone: [phone] Fax: [phone]     Social Determinants of Health (SDOH) Social History: SDOH Screenings   Food Insecurity: No Food Insecurity (09/11/2022)  Housing: Low Risk  (09/11/2022)  Transportation Needs: No Transportation Needs (09/11/2022)  Utilities: Not At Risk (09/11/2022)  Tobacco Use: Medium Risk (03/30/2023)   SDOH Interventions:     Readmission Risk Interventions    09/12/2022   11:09 AM  Readmission Risk Prevention Plan   PCP or Specialist Appt within 3-5 Days Complete  Social Work Consult for Recovery Care Planning/Counseling Complete  Palliative Care Screening Not Applicable

## 2023-03-31 NOTE — Progress Notes (Signed)
Patient complained of chest pain at 0315. 2 minutes before my other patient in 239A complained of chest pain. EKG completed on this patient first. MD made aware. Orders written.

## 2023-03-31 NOTE — Plan of Care (Signed)

## 2023-03-31 NOTE — TOC Transition Note (Addendum)
Transition of Care [facility]) - CM/SW Discharge Note   Patient Details  Name: [name] MRN: [id number] Date of Birth: 02-Jan-1946  Transition [facility] Care [facility]) CM/SW Contact:  Truddie Hidden, RN Phone Number: 03/31/2023, 1:42 PM   Clinical Narrative:    10:02am Attempt to reach Ellwood Sayers at facility regarding potential discharge for today. No answer. Left a message.  12:06pm Per Tammy in admissions patient can admit today.  Patient assigned room # 501-A Nurse will call report to [phone] Spoke with patient. Message left for her daughter.  Face sheet and medical necessity forms printed to the floor to be added to the EMS pack EMS arranged  Discharge summary and SNF transfer report sent in HUB.  TOC signing off         Patient Goals and CMS Choice      Discharge Placement                         Discharge Plan and Services Additional resources added to the After Visit Summary for                                       Social Determinants of Health (SDOH) Interventions SDOH Screenings   Food Insecurity: No Food Insecurity (09/11/2022)  Housing: Low Risk  (09/11/2022)  Transportation Needs: No Transportation Needs (09/11/2022)  Utilities: Not At Risk (09/11/2022)  Tobacco Use: Medium Risk (03/30/2023)     Readmission Risk Interventions    09/12/2022   11:09 AM  Readmission Risk Prevention Plan  PCP or Specialist Appt within 3-5 Days Complete  Social Work Consult for Recovery Care Planning/Counseling Complete  Palliative Care Screening Not Applicable

## 2023-03-31 NOTE — NC FL2 (Signed)
[place] MEDICAID FL2 LEVEL OF CARE FORM     IDENTIFICATION  Patient Name: [name] Birthdate: 1946/05/24 Sex: female Admission Date (Current Location): 03/29/2023  [facility] and [place] Number:  Chiropodist and Address:  [facility], [address], [place], [place] [address]      Provider Number: [id number]  Attending Physician Name and Address:  Arnetha Courser, MD  Relative Name and Phone Number:  Ayana, Imhof (Daughter)  [phone] [facility])    Current Level of Care: Hospital Recommended Level of Care: Skilled Nursing Facility Prior Approval Number:    Date Approved/Denied:   PASRR Number: [id number]  Discharge Plan: SNF    Current Diagnoses: Patient Active Problem List   Diagnosis Date Noted   Acute DVT of right tibial vein (HCC) 03/30/2023   Facial weakness 03/30/2023   DVT (deep venous thrombosis) (HCC) 03/30/2023   Mouth droop due to facial weakness 03/29/2023   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) 03/29/2023   Cellulitis of lower extremity 09/10/2022   Near syncope 08/22/2022   Constipation 08/22/2022   Stage 3a chronic kidney disease (HCC) 06/13/2022   Ulcers of both lower extremities, with unspecified severity (HCC) 03/03/2022   Class II obesity 03/03/2022   Anemia of chronic disease 03/03/2022   PAD (peripheral artery disease) (HCC) 05/30/2020   Malignant neoplasm of right female breast (HCC) 05/13/2018   Venous insufficiency of both lower extremities 11/20/2017   Weight gain 11/20/2017   Lymphedema 04/19/2017   Benign essential HTN 04/19/2017   OSA (obstructive sleep apnea) 04/19/2017   Morbid obesity (HCC) 04/19/2017   TIA (transient ischemic attack) 03/28/2017   Urinary incontinence 06/27/2016   Chronic systolic CHF (congestive heart failure) (HCC) 04/20/2013   Breast cancer (HCC) 09/08/2004    Orientation RESPIRATION BLADDER Height & Weight     Self, Time, Place  O2  (O22L) Incontinent Weight: 103.1 kg Height:  5\' 9"  (175.3 cm)  BEHAVIORAL SYMPTOMS/MOOD NEUROLOGICAL BOWEL NUTRITION STATUS  Other (Comment) (n/a)  (n/a) Incontinent Diet  AMBULATORY STATUS COMMUNICATION OF NEEDS Skin   Extensive Assist Verbally Normal                       Personal Care Assistance Level of Assistance  Bathing, Dressing Bathing Assistance: Limited assistance   Dressing Assistance: Limited assistance     Functional Limitations Info             SPECIAL CARE FACTORS FREQUENCY                       Contractures Contractures Info: Not present    Additional Factors Info  Allergies   Allergies Info: No Known Allergies           Current Medications (03/31/2023):  This is the current hospital active medication list Current Facility-Administered Medications  Medication Dose Route Frequency Provider Last Rate Last Admin   acetaminophen (TYLENOL) tablet 650 mg  650 mg Oral Q6H PRN Annice Needy, MD       Or   acetaminophen (TYLENOL) suppository 650 mg  650 mg Rectal Q6H PRN Annice Needy, MD       anastrozole (ARIMIDEX) tablet 1 mg  1 mg Oral Daily Annice Needy, MD   1 mg at 03/31/23 0908   dexamethasone (DECADRON) injection 4 mg  4 mg Intravenous Q12H Arnetha Courser, MD       HYDROcodone-acetaminophen (NORCO/VICODIN) 5-325 MG per tablet 1  tablet  1 tablet Oral TID PRN Annice Needy, MD       ipratropium-albuterol (DUONEB) 0.5-2.5 (3) MG/3ML nebulizer solution 3 mL  3 mL Nebulization Q6H PRN Annice Needy, MD       isosorbide mononitrate (IMDUR) 24 hr tablet 60 mg  60 mg Oral BID Annice Needy, MD   60 mg at 03/31/23 0908   levETIRAcetam (KEPPRA) IVPB 500 mg/100 mL premix  500 mg Intravenous Q12H Annice Needy, MD 400 mL/hr at 03/31/23 0548 500 mg at 03/31/23 0548   losartan (COZAAR) tablet 50 mg  50 mg Oral Daily Annice Needy, MD   50 mg at 03/31/23 0908   morphine (PF) 2 MG/ML injection 2 mg  2 mg Intravenous Q4H PRN Annice Needy, MD   2 mg at 03/31/23 0321    ondansetron (ZOFRAN) injection 4 mg  4 mg Intravenous Q6H PRN Andris Baumann, MD       oxybutynin (DITROPAN) tablet 5 mg  5 mg Oral BID Annice Needy, MD   5 mg at 03/31/23 0907   pregabalin (LYRICA) capsule 75 mg  75 mg Oral Daily Arnetha Courser, MD   75 mg at 03/31/23 0908   sodium chloride flush (NS) 0.9 % injection 3 mL  3 mL Intravenous Q12H Annice Needy, MD   3 mL at 03/31/23 0057   spironolactone (ALDACTONE) tablet 12.5 mg  12.5 mg Oral Daily Arnetha Courser, MD   12.5 mg at 03/31/23 0915     Discharge Medications: Please see discharge summary for a list of discharge medications.  Relevant Imaging Results:  Relevant Lab Results:   Additional Information SS#: [id number].  Truddie Hidden, RN

## 2023-03-31 NOTE — Discharge Summary (Signed)
Physician Discharge Summary   Patient: [name] MRN: [id number] DOB: 12/22/1945  Admit date:     03/29/2023  Discharge date: 03/31/23  Discharge Physician: Arnetha Courser   PCP: Emogene [name], MD   Recommendations at discharge:  Follow-up with oncology and radiation oncology. Patient is being started on Eliquis for DVT-please monitor for any abnormal bleeding. Patient is also being started on Decadron for vasogenic edema and Keppra for seizure prevention.  Discharge Diagnoses: Principal Problem:   Neoplasm of brain causing mass 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 structures [facility]) Active Problems:   Mouth droop due to facial weakness   Breast cancer (HCC)   Acute DVT of right tibial vein (HCC)   Chronic systolic CHF (congestive heart failure) (HCC)   Benign essential HTN   Anemia of chronic disease   Stage 3a chronic kidney disease (HCC)   Facial weakness  Resolved Problems:   Essential hypertension  Hospital Course:  [name] is a 77 y.o. female with medical history significant for history of breast cancer, heart failure, chronic kidney disease presenting with generalized weakness and lethargy since early morning today. . Per report patient feels that she feels difficulty speaking but other than that no headaches currently vision speech or falls.   ED course.  Vitals and labs mostly stable.  D-dimer elevated 1.20 MRI brain with peripherally enhancing mass with central nonenhancement along with grade-white junction at the right cingulate gyrus, with surrounding vasogenic edema.  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 CT chest, abdomen and pelvis with no acute abnormality.  Noted to have indeterminate 2 cm lesion from the lateral mid to lower pole of right kidney, and nonemergent MRI was suggested Lower extremity venous Doppler was positive for a thrombus in right posterior tibial vein.  Neurosurgery was consulted and she was started on Decadron and Keppra.  7/22: Vitals  with mildly elevated blood pressure.  Vascular surgery was consulted for IVC filter placement as there was concern of high risk for intracranial bleeding with this new diagnosis of mass.  Discussed with neurosurgery and oncology, patient is not a candidate for biopsy due to the location of brain lesion.  Neurosurgery and oncology is okay to start her on anticoagulation. They are recommending discharge on Decadron and Keppra with a close outpatient follow-up with oncology and radiation oncology.  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 In August 2019 she was diagnosed with stage I right breast cancer which was ER/PR positive.  Patient was on Arimidex which she was not taking it for approximately 5 months while at facility. Recent mammogram was without any significant abnormality.  7/23: Vitals stable with mildly elevated blood pressure.  Still appears little confused but able to answer questions appropriately.  Slow in response.  She she is being started on Eliquis and will continue on p.o. Decadron and Keppra as advised by neurosurgery due to recent diagnosis of brain mass causing some vasogenic edema.  IVC filter was placed by vascular surgery yesterday.  Based on her risk for thromboembolism due to active malignancy, and after discussing with oncology and neurosurgery she is being started on Eliquis.  Patient need to follow-up closely with oncology and radiation oncology within next few days.  They are aware and arranging appointments.  Unfortunately her mass is not amenable for any surgical intervention or biopsy due to a central location which can cause more harm than benefit per  neurosurgery.  Patient will continue on current medications and need to have a close follow-up with her providers for further management.  Patient is very high risk for deterioration and  mortality based on underlying comorbidities, poor functional status and now new diagnosis of brain mass which is most likely a mets from her prior breast cancer.  Oncology to take over her care from here and they will decide when to decide about palliative care and hospice services.   Assessment and Plan: * Neoplasm of brain causing mass 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 structures [facility]) Patient with history of breast cancer, found to have mass with vasogenic edema. Concern of metastatic disease.  Not amenable for biopsy due to the location per neurosurgery. Oncology and radiation oncology was consulted and they were recommending continue Decadron and Keppra and they will follow-up as outpatient within a week  Mouth droop due to facial weakness Pt is Alert/awake and oriented.  Resolved on my exam.    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  Radiation oncology and oncology is on board and they will follow-up as outpatient.  Acute DVT of right tibial vein (HCC) Not sure if this is acute pt is high risk for DVT and also has had DVT in left leg in past.  Patient was found to have DVT involving right lower extremity. -Vascular surgery was consulted to place IVC filter. -Will start on Eliquis without any loading dose after discussing with neurosurgery and oncology .   Chronic systolic CHF (congestive heart failure) (HCC) EF low normal on the most recent echo done in June 2023 Compensated and stable.  No LE edema / SOB. Imdur  and losartan continued.    Benign essential HTN -Continue losartan and Imdur.     Stage 3a chronic kidney disease (HCC) Stable.  -Monitor renal function -Avoid nephrotoxins    Anemia of chronic disease Stable anemia. We will follow.      Pain control - [employer] Controlled Substance Reporting System database was reviewed. and patient was instructed, not to drive, operate heavy machinery, perform activities at heights, swimming or  participation in water activities or provide baby-sitting services while on Pain, Sleep and Anxiety Medications; until their outpatient Physician has advised to do so again. Also recommended to not to take more than prescribed Pain, Sleep and Anxiety Medications.  Consultants: Neurosurgery.  Oncology, vascular surgery. Procedures performed: None Disposition: Skilled nursing facility Diet recommendation:  Discharge Diet Orders (From admission, onward)     Start     Ordered   03/31/23 0000  Diet - low sodium heart healthy        03/31/23 1211           Cardiac diet DISCHARGE MEDICATION: Allergies as of 03/31/2023   No Known Allergies      Medication List     STOP taking these medications    aspirin EC 81 MG tablet   cloNIDine 0.1 MG tablet Commonly known as: CATAPRES   cloNIDine HCl 0.1 MG Tb12 ER tablet Commonly known as: KAPVAY   furosemide 20 MG tablet Commonly known as: LASIX   gabapentin 300 MG capsule Commonly known as: NEURONTIN       TAKE these medications    acetaminophen 325 MG tablet Commonly known as: TYLENOL Take 2 tablets (650 mg total) by mouth every 6 (six) hours as needed for mild pain (or Fever >/= 101).   anastrozole 1 MG tablet Commonly known as: ARIMIDEX Take 1 tablet (1 mg total) by  mouth daily.   ascorbic acid 500 MG tablet Commonly known as: VITAMIN C Take 1 tablet by mouth 2 (two) times daily.   bisacodyl 5 MG EC tablet Commonly known as: DULCOLAX Take 2 tablets (10 mg total) by mouth at bedtime. Skip the dose if no constipation What changed: when to take this   Bladder Control Pads Ex Absorb Misc Apply 1 Dose topically as needed.   Calcium Carbonate-Vitamin D 600-5 MG-MCG Tabs Take 1 tablet by mouth 2 (two) times daily.   dexamethasone 4 MG tablet Commonly known as: DECADRON Take 1 tablet (4 mg total) by mouth 2 (two) times daily with a meal.   diclofenac Sodium 1 % Gel Commonly known as: VOLTAREN Apply 2 g topically 2  (two) times daily as needed.   empagliflozin 10 MG Tabs tablet Commonly known as: Jardiance Take 1 tablet (10 mg total) by mouth daily before breakfast.   eucerin lotion Apply topically 2 (two) times daily. Apply to dry skin especially bilateral lower extremities and arms.   ferrous sulfate 325 (65 FE) MG tablet Take 325 mg by mouth every other day.   guaiFENesin 600 MG 12 hr tablet Commonly known as: MUCINEX Take 600 mg by mouth every 12 (twelve) hours.   HYDROcodone-acetaminophen 5-325 MG tablet Commonly known as: NORCO/VICODIN Take 1 tablet by mouth 3 (three) times daily as needed.   ipratropium-albuterol 0.5-2.5 (3) MG/3ML Soln Commonly known as: DUONEB Take 3 mLs by nebulization every 4 (four) hours as needed (wheezing).   isosorbide mononitrate 60 MG 24 hr tablet Commonly known as: Imdur Take 1 tablet (60 mg total) by mouth 2 (two) times daily.   levETIRAcetam 500 MG tablet Commonly known as: KEPPRA Take 1 tablet (500 mg total) by mouth 2 (two) times daily.   loratadine 10 MG tablet Commonly known as: CLARITIN Take 10 mg by mouth daily.   losartan 50 MG tablet Commonly known as: COZAAR Take 50 mg by mouth daily.   omeprazole 40 MG capsule Commonly known as: PRILOSEC Take 40 mg by mouth daily.   oxybutynin 5 MG tablet Commonly known as: DITROPAN Take 5 mg by mouth 2 (two) times daily.   polyethylene glycol 17 g packet Commonly known as: MIRALAX / GLYCOLAX Take 17 g by mouth daily. Skip the dose if no constipation   pregabalin 75 MG capsule Commonly known as: LYRICA Take 75 mg by mouth daily.   sennosides-docusate sodium 8.6-50 MG tablet Commonly known as: SENOKOT-S Take 1 tablet by mouth 2 (two) times daily.   spironolactone 25 MG tablet Commonly known as: ALDACTONE Take 12.5 mg by mouth daily.   traMADol HCl 25 MG Tabs Take 1 tablet by mouth every 6 (six) hours as needed.   vitamin B-12 500 MCG tablet Commonly known as: CYANOCOBALAMIN Take  500 mcg by mouth daily.   Vitamin D (Ergocalciferol) 1.25 MG (50000 UNIT) Caps capsule Commonly known as: DRISDOL Take 50,000 Units by mouth once a week.   Zinc Oxide 12.8 % ointment Commonly known as: TRIPLE PASTE Apply barrier cream to buttocks every shift.        Follow-up Information     Aycock, Ngwe A, MD. Schedule an appointment as soon as possible for a visit in 1 week(s).   Specialty: Family Medicine Contact information: [address] [place] [address] [phone]         Creig Hines, MD. Schedule an appointment as soon as possible for a visit.   Specialty: Oncology Contact  information: [address] [place] [address] [phone]                Discharge Exam: Ceasar Mons Weights   03/29/23 1346 03/31/23 0500  Weight: 113.4 kg 103.1 kg   General.  Chronically ill-appearing elderly lady, in no acute distress. Pulmonary.  Lungs clear bilaterally, normal respiratory effort. CV.  Regular rate and rhythm, no JVD, rub or murmur. Abdomen.  Soft, nontender, nondistended, BS positive. CNS.  Alert and oriented .  No focal neurologic deficit. Extremities.  No edema, no cyanosis, pulses intact and symmetrical. Psychiatry.  Appears to have some cognitive impairment  Condition at discharge: stable  The results of significant diagnostics from this hospitalization (including imaging, microbiology, ancillary and laboratory) are listed below for reference.   Imaging Studies: PERIPHERAL VASCULAR CATHETERIZATION  Result Date: 03/30/2023 See surgical note for result.  CT CHEST ABDOMEN PELVIS WO CONTRAST  Result Date: 03/30/2023 CLINICAL DATA:  Generalized weakness and lethargy. Evaluate for metastatic disease. EXAM: CT CHEST, ABDOMEN AND PELVIS WITHOUT CONTRAST TECHNIQUE: Multidetector CT imaging of the chest, abdomen and pelvis was performed following the standard protocol without IV contrast. RADIATION DOSE REDUCTION: This exam was performed  according to the departmental dose-optimization program which includes automated exposure control, adjustment of the mA and/or kV according to patient size and/or use of iterative reconstruction technique. COMPARISON:  CT abdomen pelvis dated 09/11/2022. FINDINGS: Evaluation of this exam is limited in the absence of intravenous contrast. CT CHEST FINDINGS Cardiovascular: There is mild cardiomegaly. No pericardial effusion. There is coronary vascular calcification. Mild atherosclerotic calcification of the thoracic aorta. No aneurysmal dilatation. Mild dilatation of the main pulmonary trunk suggestive of pulmonary hypertension. Clinical correlation is recommended. Mediastinum/Nodes: No hilar or mediastinal adenopathy. The esophagus is grossly unremarkable. Multiple bilateral thyroid nodules measure up to 2 cm on the right. This has been evaluated on previous imaging. (ref: J Am Coll Radiol. 2015 Feb;12(2): 143-50).No mediastinal fluid collection. Lungs/Pleura: No focal consolidation, pleural effusion, or pneumothorax. The central airways are patent. Musculoskeletal: Degenerative changes of the spine. No acute osseous pathology. CT ABDOMEN PELVIS FINDINGS No intra-abdominal free air or free fluid. Hepatobiliary: Subcentimeter hypodense lesion in the right lobe of the liver is too small to characterize but demonstrates fluid attenuation and similar to prior CT, most consistent with a cyst. The liver is otherwise unremarkable. No biliary dilatation. The gallbladder is unremarkable. Pancreas: Unremarkable. No pancreatic ductal dilatation or surrounding inflammatory changes. Spleen: Normal in size without focal abnormality. Adrenals/Urinary Tract: The adrenal glands are unremarkable. There is a 4.5 cm right renal upper pole cyst. A 2 point cm high attenuating/solid-appearing lesion from the lateral mid to lower pole of the right kidney is not characterized on this CT. Further characterization with MRI without and with  contrast on a nonemergent/outpatient basis recommended. There is no hydronephrosis or nephrolithiasis on either side. The urinary bladder is grossly unremarkable. Stomach/Bowel: There is no bowel obstruction or active inflammation. The appendix is not visualized with certainty. No inflammatory changes identified in the right lower quadrant. Vascular/Lymphatic: Mild aortoiliac atherosclerotic disease. The IVC is unremarkable. No portal venous gas. There is no adenopathy. Reproductive: Myomatous uterus.  No adnexal masses. Other: Midline anterior pelvic wall hernia repair. There is a small fat containing umbilical hernia. Musculoskeletal: Degenerative changes of the spine and right hip. No acute osseous pathology. IMPRESSION: 1. No acute intrathoracic, abdominal, or pelvic pathology. No evidence of metastatic disease. 2. Indeterminate 2 cm lesion from the lateral mid to lower pole  of the right kidney. Further characterization with MRI without and with contrast on a nonemergent/outpatient basis recommended. 3. Aortic Atherosclerosis (ICD10-I70.0). Electronically Signed   By: Elgie Collard M.D.   On: 03/30/2023 02:11   US Venous Img Lower Bilateral (DVT)  Result Date: 03/30/2023 CLINICAL DATA:  Bilateral lower extremity pain and swelling, history of deep venous thrombosis. EXAM: BILATERAL LOWER EXTREMITY VENOUS DOPPLER ULTRASOUND TECHNIQUE: Gray-scale sonography with graded compression, as well as color Doppler and duplex ultrasound were performed to evaluate the lower extremity deep venous systems from the level of the common femoral vein and including the common femoral, femoral, profunda femoral, popliteal and calf veins including the posterior tibial, peroneal and gastrocnemius veins when visible. The superficial great saphenous vein was also interrogated. Spectral Doppler was utilized to evaluate flow at rest and with distal augmentation maneuvers in the common femoral, femoral and popliteal veins.  COMPARISON:  None Available. FINDINGS: RIGHT LOWER EXTREMITY Common Femoral Vein: No evidence of thrombus. Normal compressibility, respiratory phasicity and response to augmentation. Saphenofemoral Junction: No evidence of thrombus. Normal compressibility and flow on color Doppler imaging. Profunda Femoral Vein: No evidence of thrombus. Normal compressibility and flow on color Doppler imaging. Femoral Vein: No evidence of thrombus. Normal compressibility, respiratory phasicity and response to augmentation. Popliteal Vein: No evidence of thrombus. Normal compressibility, respiratory phasicity and response to augmentation. Calf Veins: Thrombus is noted in the posterior tibial vein. The peroneal vein is not well visualized. Superficial Great Saphenous Vein: No evidence of thrombus. Normal compressibility. Venous Reflux:  None. Other Findings:  None. LEFT LOWER EXTREMITY Common Femoral Vein: No evidence of thrombus. Normal compressibility, respiratory phasicity and response to augmentation. Saphenofemoral Junction: No evidence of thrombus. Normal compressibility and flow on color Doppler imaging. Profunda Femoral Vein: No evidence of thrombus. Normal compressibility and flow on color Doppler imaging. Femoral Vein: No evidence of thrombus. Normal compressibility, respiratory phasicity and response to augmentation. Popliteal Vein: No evidence of thrombus. Normal compressibility, respiratory phasicity and response to augmentation. Calf Veins: Calf veins are not well visualized. Superficial Great Saphenous Vein: No evidence of thrombus. Normal compressibility. Venous Reflux:  None. Other Findings:  None. IMPRESSION: Thrombus noted in the right posterior tibial vein. No other focal abnormality is noted. Electronically Signed   By: Alcide Clever M.D.   On: 03/30/2023 01:08   MR BRAIN W WO CONTRAST  Result Date: 03/29/2023 CLINICAL DATA:  right facial droop. EXAM: MRI HEAD WITHOUT AND WITH CONTRAST TECHNIQUE: Multiplanar,  multiecho pulse sequences of the brain and surrounding structures were obtained without and with intravenous contrast. CONTRAST:  10mL GADAVIST GADOBUTROL 1 MMOL/ML IV SOLN COMPARISON:  Brain MRI 03/03/2022. FINDINGS: Brain: Peripherally enhancing mass with central nonenhancement along the gray-white junction of the right cingulate gyrus, measuring up to 13 mm (axial image 109 series 20) with surrounding vasogenic edema. No other foci of abnormal enhancement in the brain. No acute infarct or hemorrhage. Mild chronic small-vessel disease. No hydrocephalus or extra-axial collection. Vascular: Normal flow voids and vessel enhancement. Skull and upper cervical spine: Normal marrow signal and enhancement. Sinuses/Orbits: Unremarkable. Other: None. IMPRESSION: Peripherally enhancing mass with central nonenhancement along the gray-white junction of the right cingulate gyrus, measuring up to 13 mm with surrounding vasogenic edema. 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 Electronically Signed   By: [name] M.D.   On: 03/29/2023 18:11   MM DIAG BREAST TOMO UNI RIGHT  Result Date: 03/02/2023 CLINICAL DATA:  77 year old female presenting with palpable area of concern in the  right breast. She has a history of right breast cancer in 2019 status post lumpectomy and radiation and history of left breast cancer in 2006 status post lumpectomy. EXAM: DIGITAL DIAGNOSTIC UNILATERAL RIGHT MAMMOGRAM WITH TOMOSYNTHESIS; ULTRASOUND RIGHT BREAST LIMITED TECHNIQUE: Right digital diagnostic mammography and breast tomosynthesis was performed.; Targeted ultrasound examination of the right breast was performed COMPARISON:  Previous exam(s). ACR Breast Density Category b: There are scattered areas of fibroglandular density. FINDINGS: MAMMOGRAM: Diagnostic mammographic images were obtained over the area of palpable concern in the right breast. No suspicious mammographic finding is identified in this area. There is stable  architectural distortion in the upper outer right breast, consistent with post lumpectomy changes. There is also stable diffuse trabecular thickening and skin thickening, consistent with postradiation changes. No suspicious mass, microcalcification, or other finding is identified in right breast. ULTRASOUND: Targeted right breast ultrasound in the area of palpable concern was performed at 9 o'clock 5-10 cm from the nipple. This demonstrates skin thickening and diffuse breast edema, consistent with postradiation changes. No suspicious solid or cystic mass or area of shadowing is identified. IMPRESSION: 1. No mammographic or sonographic abnormality in the area of concern in the right breast. 2. Stable posttreatment changes in the right breast. No evidence of malignancy. RECOMMENDATION: Any further workup of the patient's symptoms should be based on the clinical assessment. Recommend routine annual screening mammogram, next due September 2024. I have discussed the findings and recommendations with the patient. If applicable, a reminder letter will be sent to the patient regarding the next appointment. BI-RADS CATEGORY  2: Benign. Electronically Signed   By: Jacob Moores M.D.   On: 03/02/2023 16:15  US Breast Limited Uni Right Inc Axilla  Result Date: 03/02/2023 CLINICAL DATA:  77 year old female presenting with palpable area of concern in the right breast. She has a history of right breast cancer in 2019 status post lumpectomy and radiation and history of left breast cancer in 2006 status post lumpectomy. EXAM: DIGITAL DIAGNOSTIC UNILATERAL RIGHT MAMMOGRAM WITH TOMOSYNTHESIS; ULTRASOUND RIGHT BREAST LIMITED TECHNIQUE: Right digital diagnostic mammography and breast tomosynthesis was performed.; Targeted ultrasound examination of the right breast was performed COMPARISON:  Previous exam(s). ACR Breast Density Category b: There are scattered areas of fibroglandular density. FINDINGS: MAMMOGRAM: Diagnostic  mammographic images were obtained over the area of palpable concern in the right breast. No suspicious mammographic finding is identified in this area. There is stable architectural distortion in the upper outer right breast, consistent with post lumpectomy changes. There is also stable diffuse trabecular thickening and skin thickening, consistent with postradiation changes. No suspicious mass, microcalcification, or other finding is identified in right breast. ULTRASOUND: Targeted right breast ultrasound in the area of palpable concern was performed at 9 o'clock 5-10 cm from the nipple. This demonstrates skin thickening and diffuse breast edema, consistent with postradiation changes. No suspicious solid or cystic mass or area of shadowing is identified. IMPRESSION: 1. No mammographic or sonographic abnormality in the area of concern in the right breast. 2. Stable posttreatment changes in the right breast. No evidence of malignancy. RECOMMENDATION: Any further workup of the patient's symptoms should be based on the clinical assessment. Recommend routine annual screening mammogram, next due September 2024. I have discussed the findings and recommendations with the patient. If applicable, a reminder letter will be sent to the patient regarding the next appointment. BI-RADS CATEGORY  2: Benign. Electronically Signed   By: Jacob Moores M.D.   On: 03/02/2023 16:15   Microbiology: Results for  orders placed or performed during the hospital encounter of 03/29/23  SARS Coronavirus 2 by RT PCR (hospital order, performed in [facility] hospital lab) *cepheid single result test* Anterior Nasal Swab     Status: None   Collection Time: 03/29/23  4:40 PM   Specimen: Anterior Nasal Swab  Result Value Ref Range Status   SARS Coronavirus 2 by RT PCR NEGATIVE NEGATIVE Final    Comment: (NOTE) SARS-CoV-2 target nucleic acids are NOT DETECTED.  The SARS-CoV-2 RNA is generally detectable in upper and lower respiratory  specimens during the acute phase of infection. The lowest concentration of SARS-CoV-2 viral copies this assay can detect is 250 copies / mL. A negative result does not preclude SARS-CoV-2 infection and should not be used as the sole basis for treatment or other patient management decisions.  A negative result may occur with improper specimen collection / handling, submission of specimen other than nasopharyngeal swab, presence of viral mutation(s) within the areas targeted by this assay, and inadequate number of viral copies (<250 copies / mL). A negative result must be combined with clinical observations, patient history, and epidemiological information.  Fact Sheet for Patients:   RoadLapTop.co.za  Fact Sheet for Healthcare Providers: http://kim-miller.com/  This test is not yet approved or  cleared by the [place] FDA and has been authorized for detection and/or diagnosis of SARS-CoV-2 by FDA under an Emergency Use Authorization (EUA).  This EUA will remain in effect (meaning this test can be used) for the duration of the COVID-19 declaration under Section 564(b)(1) of the Act, 21 U.S.C. section 360bbb-3(b)(1), unless the authorization is terminated or revoked sooner.  Performed at [facility], [address] [place], [place] [address]     Labs: CBC: Recent Labs  Lab 03/29/23 1401 03/30/23 0542  WBC 6.7 5.8  HGB 11.5* 11.4*  HCT 36.5 36.0  MCV 84.5 82.8  PLT 261 277   Basic Metabolic Panel: Recent Labs  Lab 03/29/23 1401 03/30/23 0542  NA 140 140  K 4.2 4.2  CL 105 103  CO2 29 29  GLUCOSE 97 137*  BUN 27* 27*  CREATININE 0.88 0.87  CALCIUM 9.1 8.8*   Liver Function Tests: Recent Labs  Lab 03/30/23 0542  AST 13*  ALT 12  ALKPHOS 54  BILITOT 0.5  PROT 7.3  ALBUMIN 3.4*   CBG: Recent Labs  Lab 03/29/23 1352  GLUCAP 71    Discharge time spent: greater than 30 minutes.  This record  has been created using Conservation officer, historic buildings. Errors have been sought and corrected,but may not always be located. Such creation errors do not reflect on the standard of care.   Signed: Arnetha Courser, MD [facility] 03/31/2023

## 2023-03-31 NOTE — Progress Notes (Signed)
Progress Note    03/31/2023 10:11 AM 1 Day Post-Op  Subjective:  [name] is a 77 y.o. female who is now POD #1 from IVC filter placement due to a medical hx of breast cancer with brain mass leaving her high risk for anticoagulation with right lower extremity DVT. She originally presents to [facility] with generalized weakness. Patient endorses feeling the same today. Patients right groin is sore but tolerable. She denies and chest pain or shortness of breath this morning. She denies any pain to her legs. No other complaints overnight. Vitals all remain stable.    Vitals:   03/31/23 0313 03/31/23 0806  BP: (!) 165/73 (!) 158/80  Pulse: (!) 50 65  Resp:  17  Temp:  97.6 F (36.4 C)  SpO2: 100% 100%   Physical Exam: Cardiac:  RRR, normal S1,S2, no murmurs  Lungs:  Clear throughout on auscultation, no rales rhonchi or wheezing.  Incisions:  right groin with dressing clean dry and intact.  Extremities:  Bilateral lower extremities warm to touch with +1 palpable pulses.  Abdomen:  Positive bowel sounds throughout, soft non tender and non distended.  Neurologic: AAOX4 and follows all commands appropriately.   CBC    Component Value Date/Time   WBC 5.8 03/30/2023 0542   RBC 4.35 03/30/2023 0542   HGB 11.4 (L) 03/30/2023 0542   HGB 11.8 (L) 03/23/2014 0518   HCT 36.0 03/30/2023 0542   HCT 32.8 (L) 03/22/2014 0403   PLT 277 03/30/2023 0542   PLT 252 03/23/2014 0518   MCV 82.8 03/30/2023 0542   MCV 88 03/22/2014 0403   MCH 26.2 03/30/2023 0542   MCHC 31.7 03/30/2023 0542   RDW 16.3 (H) 03/30/2023 0542   RDW 15.0 (H) 03/22/2014 0403   LYMPHSABS 1.3 08/22/2022 1402   LYMPHSABS 1.1 03/22/2014 0403   MONOABS 0.7 08/22/2022 1402   MONOABS 0.7 03/22/2014 0403   EOSABS 0.1 08/22/2022 1402   EOSABS 0.0 03/22/2014 0403   BASOSABS 0.1 08/22/2022 1402   BASOSABS 0.0 03/22/2014 0403    BMET    Component Value Date/Time   NA 140 03/30/2023 0542   NA 142 07/04/2021 1503   NA  143 03/22/2014 0403   K 4.2 03/30/2023 0542   K 3.4 (L) 03/22/2014 0403   CL 103 03/30/2023 0542   CL 107 03/22/2014 0403   CO2 29 03/30/2023 0542   CO2 32 03/22/2014 0403   GLUCOSE 137 (H) 03/30/2023 0542   GLUCOSE 88 03/22/2014 0403   BUN 27 (H) 03/30/2023 0542   BUN 27 07/04/2021 1503   BUN 17 03/22/2014 0403   CREATININE 0.87 03/30/2023 0542   CREATININE 0.99 03/22/2014 0403   CALCIUM 8.8 (L) 03/30/2023 0542   CALCIUM 8.2 (L) 03/22/2014 0403   GFRNONAA >60 03/30/2023 0542   GFRNONAA 59 (L) 03/22/2014 0403   GFRAA 42 (L) 02/10/2020 1016   GFRAA >60 03/22/2014 0403    INR    Component Value Date/Time   INR 1.07 03/28/2017 0112   INR 1.1 03/22/2014 1302     Intake/Output Summary (Last 24 hours) at 03/31/2023 1011 Last data filed at 03/31/2023 0548 Gross per 24 hour  Intake 353 ml  Output --  Net 353 ml     Assessment/Plan:  77 y.o. female is s/p IVC filter placement 1 Day Post-Op   PLAN: Per Vascular Surgery patient is recovering as expected. No complications from IVC filter placement. Vascular surgery will sign off the case at this time. If needed  in the future please reconsult.   DVT prophylaxis:  None    [name] R [name] 03/31/2023 10:11 AM

## 2023-04-01 ENCOUNTER — Ambulatory Visit
Admission: RE | Admit: 2023-04-01 | Discharge: 2023-04-01 | Disposition: A | Discharge status: Home / Self Care | Payer: Medicare Other | Source: Ambulatory Visit | Attending: Radiation Oncology | Admitting: Radiation Oncology

## 2023-04-01 ENCOUNTER — Telehealth: Payer: Self-pay

## 2023-04-01 ENCOUNTER — Encounter: Payer: Self-pay | Admitting: Radiation Oncology

## 2023-04-01 VITALS — BP 197/100 | HR 60 | Temp 97.9°F | Resp 16

## 2023-04-01 DIAGNOSIS — C7931 Secondary malignant neoplasm of brain: Secondary | ICD-10-CM | POA: Insufficient documentation

## 2023-04-01 DIAGNOSIS — D496 Neoplasm of unspecified behavior of brain: Secondary | ICD-10-CM

## 2023-04-01 DIAGNOSIS — Z853 Personal history of malignant neoplasm of breast: Secondary | ICD-10-CM | POA: Diagnosis not present

## 2023-04-01 NOTE — Consult Note (Signed)
NEW PATIENT EVALUATION  Name: Tennelle Taflinger  MRN: [id number]  Date:   04/01/2023     DOB: [birth year]   This 77 y.o. 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  REFERRING PHYSICIAN: Emogene [name], MD  CHIEF COMPLAINT:  Chief Complaint  Patient presents with   Brain Met    DIAGNOSIS: The encounter diagnosis was 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).   PREVIOUS INVESTIGATIONS:  MRI of the brain CT scans chest abdomen pelvis reviewed Labs reviewed Clinical notes reviewed  HPI: Patient is a 77 year old female well-known to our department treated back in 2019 with a hypofractionated course of whole breast radiation for stage I ER PR positive invasive mammary carcinoma the right breast.  She also had triple -3.5 cm breast cancer of the left breast back in 2006.  She recent presented to the emergency department with a feeling that her mouth was "twisted".  She was found on MRI scan to have a peripheral hand segments within the central nonenhancement along the gray-white junction of the right cingulate gyrus measuring up to 1.3 cm with surrounding vasogenic and edema.  CT scan of chest abdomen and pelvis showed no acute intrathoracic abdominal or pelvic pathology or evidence of metastatic disease.  She did have a 2 cm lesion in the left lateral mid to lower pole of the right kidney.  Patient is seen today for consideration of palliative radiation therapy to her solitary brain metastasis.  She is frail wheelchair-bound poor communication skills.  Patient did have an IVC filter placed.  PLANNED TREATMENT REGIMEN: IMRT hypofractionated partial brain irradiation  PAST MEDICAL HISTORY:  has a past medical history of Abnormality of gait, Acute on chronic diastolic CHF (congestive heart failure) (HCC) (11/20/2017), Acute respiratory failure with hypoxia (HCC) (06/13/2022),  AKI (acute kidney injury) (HCC) (03/03/2022), Breast cancer (HCC) (02/05/2005), Breast cancer (HCC) (2019), CHF (congestive heart failure) (HCC), Chronic kidney disease, Chronic kidney disease, stage 3b (HCC) (09/10/2022), Complication of anesthesia (2006), Edema, GERD (gastroesophageal reflux disease), Glucose intolerance (09/10/2022), Headache, Heart murmur, Hypertension, Hypertensive emergency (06/13/2022), Hypokinesis, Lower limb ulcer, calf, left, limited to breakdown of skin (HCC) (12/11/2020), Malignant neoplasm of breast (female), unspecified site (06/14/2018), Neuropathy, Obesity, Pain in joint, site unspecified, Personal history of chemotherapy (2006), Personal history of radiation therapy (2019), Personal history of radiation therapy (2006), Pneumonia, Pyuria (08/22/2022), Sleep apnea, Syncope (03/03/2022), Tinea pedis, Unspecified hearing loss, and Weakness (03/03/2022).    PAST SURGICAL HISTORY:  Past Surgical History:  Procedure Laterality Date   BREAST BIOPSY Right 2019   11 oc-INVASIVE MAMMARY CARCINOMA, NO SPECIAL TYPE   BREAST BIOPSY Right 2019   1030 oc- neg   BREAST LUMPECTOMY  2006   left breast    CATARACT EXTRACTION     left eye   HERNIA REPAIR  05/25/2006   Incarcerated omentum in ventral hernia, Ventralight mesh, combined lap/ open with omental resection.    IVC FILTER INSERTION N/A 03/30/2023   Procedure: IVC FILTER INSERTION;  Surgeon: Annice Needy, MD;  Location: [facility] INVASIVE CV LAB;  Service: Cardiovascular;  Laterality: N/A;   PARTIAL MASTECTOMY WITH NEEDLE LOCALIZATION Right 06/14/2018   Procedure: PARTIAL MASTECTOMY WITH NEEDLE LOCALIZATION;  Surgeon: Earline Mayotte, MD;  Location: [facility] ORS;  Service: General;  Laterality: Right;   PORT-A-CATH REMOVAL  05/25/2006   SENTINEL NODE BIOPSY Right 06/14/2018   Procedure: SENTINEL NODE BIOPSY;  Surgeon: Lemar Livings,  Merrily Pew, MD;  Location: [facility] ORS;  Service: General;  Laterality: Right;   TUBAL LIGATION      FAMILY  HISTORY: family history includes Breast cancer (age of onset: 84) in an other family member; Cataracts in her mother; Coronary artery disease in an other family member; Hypertension in her mother.  SOCIAL HISTORY:  reports that she quit smoking about 22 years ago. Her smoking use included cigarettes. She started smoking about 52 years ago. She has a 15 pack-year smoking history. She has never used smokeless tobacco. She reports that she does not currently use drugs after having used the following drugs: Marijuana. She reports that she does not drink alcohol.  ALLERGIES: Patient has no known allergies.  MEDICATIONS:  Current Outpatient Medications  Medication Sig Dispense Refill   acetaminophen (TYLENOL) 325 MG tablet Take 2 tablets (650 mg total) by mouth every 6 (six) hours as needed for mild pain (or Fever >/= 101).     anastrozole (ARIMIDEX) 1 MG tablet Take 1 tablet (1 mg total) by mouth daily. 30 tablet 6   ascorbic acid (VITAMIN C) 500 MG tablet Take 1 tablet by mouth 2 (two) times daily.     bisacodyl (DULCOLAX) 5 MG EC tablet Take 2 tablets (10 mg total) by mouth at bedtime. Skip the dose if no constipation (Patient taking differently: Take 10 mg by mouth 2 (two) times daily. Skip the dose if no constipation) 30 tablet 0   Calcium Carbonate-Vitamin D 600-5 MG-MCG TABS Take 1 tablet by mouth 2 (two) times daily.     dexamethasone (DECADRON) 4 MG tablet Take 1 tablet (4 mg total) by mouth 2 (two) times daily with a meal.     diclofenac Sodium (VOLTAREN) 1 % GEL Apply 2 g topically 2 (two) times daily as needed.     Emollient (EUCERIN) lotion Apply topically 2 (two) times daily. Apply to dry skin especially bilateral lower extremities and arms. 240 mL 0   empagliflozin (JARDIANCE) 10 MG TABS tablet Take 1 tablet (10 mg total) by mouth daily before breakfast. 30 tablet 0   ferrous sulfate 325 (65 FE) MG tablet Take 325 mg by mouth every other day.     guaiFENesin (MUCINEX) 600 MG 12 hr tablet  Take 600 mg by mouth every 12 (twelve) hours.     HYDROcodone-acetaminophen (NORCO/VICODIN) 5-325 MG tablet Take 1 tablet by mouth 3 (three) times daily as needed. 20 tablet 0   Incontinence Supply Disposable (BLADDER CONTROL PADS EX ABSORB) MISC Apply 1 Dose topically as needed.      ipratropium-albuterol (DUONEB) 0.5-2.5 (3) MG/3ML SOLN Take 3 mLs by nebulization every 4 (four) hours as needed (wheezing).     isosorbide mononitrate (IMDUR) 60 MG 24 hr tablet Take 1 tablet (60 mg total) by mouth 2 (two) times daily. 60 tablet 0   levETIRAcetam (KEPPRA) 500 MG tablet Take 1 tablet (500 mg total) by mouth 2 (two) times daily.     loratadine (CLARITIN) 10 MG tablet Take 10 mg by mouth daily.     losartan (COZAAR) 50 MG tablet Take 50 mg by mouth daily.     omeprazole (PRILOSEC) 40 MG capsule Take 40 mg by mouth daily.     oxybutynin (DITROPAN) 5 MG tablet Take 5 mg by mouth 2 (two) times daily.     polyethylene glycol (MIRALAX / GLYCOLAX) 17 g packet Take 17 g by mouth daily. Skip the dose if no constipation 14 each 0   pregabalin (LYRICA) 75  MG capsule Take 75 mg by mouth daily.     sennosides-docusate sodium (SENOKOT-S) 8.6-50 MG tablet Take 1 tablet by mouth 2 (two) times daily.     spironolactone (ALDACTONE) 25 MG tablet Take 12.5 mg by mouth daily.     traMADol HCl 25 MG TABS Take 1 tablet by mouth every 6 (six) hours as needed. 30 tablet 0   vitamin B-12 (CYANOCOBALAMIN) 500 MCG tablet Take 500 mcg by mouth daily.     Vitamin D, Ergocalciferol, (DRISDOL) 1.25 MG (50000 UNIT) CAPS capsule Take 50,000 Units by mouth once a week.     Zinc Oxide (TRIPLE PASTE) 12.8 % ointment Apply barrier cream to buttocks every shift.     No current facility-administered medications for this encounter.    ECOG PERFORMANCE STATUS:  2 - Symptomatic, <50% confined to bed  REVIEW OF SYSTEMS: Patient has history of CHF acute respiratory failure with hypoxia acute kidney injury bilateral breast cancer. Patient  denies any weight loss, fatigue, weakness, fever, chills or night sweats. Patient denies any loss of vision, blurred vision. Patient denies any ringing  of the ears or hearing loss. No irregular heartbeat. Patient denies heart murmur or history of fainting. Patient denies any chest pain or pain radiating to her upper extremities. Patient denies any shortness of breath, difficulty breathing at night, cough or hemoptysis. Patient denies any swelling in the lower legs. Patient denies any nausea vomiting, vomiting of blood, or coffee ground material in the vomitus. Patient denies any stomach pain. Patient states has had normal bowel movements no significant constipation or diarrhea. Patient denies any dysuria, hematuria or significant nocturia. Patient denies any problems walking, swelling in the joints or loss of balance. Patient denies any skin changes, loss of hair or loss of weight. Patient denies any excessive worrying or anxiety or significant depression. Patient denies any problems with insomnia. Patient denies excessive thirst, polyuria, polydipsia. Patient denies any swollen glands, patient denies easy bruising or easy bleeding. Patient denies any recent infections, allergies or URI. Patient "s visual fields have not changed significantly in recent time.   PHYSICAL EXAM: BP (!) 197/100   Pulse 60   Temp 97.9 F (36.6 C) (Tympanic)   Resp 16  Wheelchair-bound frail female in NAD.  Crude visual fields within normal range motor or sensory and DTR levels appear equal and symmetric.  Proprioception is intact.  Well-developed well-nourished patient in NAD. HEENT reveals PERLA, EOMI, discs not visualized.  Oral cavity is clear. No oral mucosal lesions are identified. Neck is clear without evidence of cervical or supraclavicular adenopathy. Lungs are clear to A&P. Cardiac examination is essentially unremarkable with regular rate and rhythm without murmur rub or thrill. Abdomen is benign with no organomegaly or  masses noted. Motor sensory and DTR levels are equal and symmetric in the upper and lower extremities. Cranial nerves II through XII are grossly intact. Proprioception is intact. No peripheral adenopathy or edema is identified. No motor or sensory levels are noted. Crude visual fields are within normal range.  LABORATORY DATA: Labs reviewed prior pathology reports reviewed    RADIOLOGY RESULTS: CT scans MRI scans of her brain reviewed compatible with above-stated findings   IMPRESSION: [place] brain metastasis from presumed breast cancer in 77 year old female with multiple medical comorbidities.  PLAN: This time I have recommended IMRT radiation therapy to the solitary brain metastasis.  Will plan on delivering 30 Gray in 5 fractions.  Risks and benefits of treatment clinic some partial hair loss fatigue possible  alteration of blood counts all were reviewed in detail with the patient.  I have personally 7 ordered CT simulation for next week.  Will use an MRI scan and a fusion study for outlining our tumor volume.  Patient comprehends my recommendations well.  She is currently on Decadron and will continue on that throughout her treatments.  I would like to take this opportunity to thank you for allowing me to participate in the care of your patient.[name], MD

## 2023-04-01 NOTE — Telephone Encounter (Signed)
I called [name]  back at [facility] [phone] and left a message letting her know that the patient has been scheduled for follow-up with Dr. Smith Robert on 04/07/23 at 2:00 pm.

## 2023-04-07 ENCOUNTER — Encounter: Payer: Self-pay | Admitting: Oncology

## 2023-04-07 ENCOUNTER — Inpatient Hospital Stay: Payer: Medicare Other | Attending: Oncology | Admitting: Oncology

## 2023-04-07 ENCOUNTER — Telehealth: Payer: Self-pay | Admitting: *Deleted

## 2023-04-07 VITALS — HR 69 | Temp 96.9°F | Resp 18 | Ht 69.0 in | Wt 232.0 lb

## 2023-04-07 DIAGNOSIS — Z79811 Long term (current) use of aromatase inhibitors: Secondary | ICD-10-CM | POA: Diagnosis not present

## 2023-04-07 DIAGNOSIS — C7931 Secondary malignant neoplasm of brain: Secondary | ICD-10-CM | POA: Diagnosis not present

## 2023-04-07 DIAGNOSIS — Z87891 Personal history of nicotine dependence: Secondary | ICD-10-CM | POA: Insufficient documentation

## 2023-04-07 DIAGNOSIS — C50411 Malignant neoplasm of upper-outer quadrant of right female breast: Secondary | ICD-10-CM | POA: Diagnosis present

## 2023-04-07 DIAGNOSIS — N6313 Unspecified lump in the right breast, lower outer quadrant: Secondary | ICD-10-CM | POA: Diagnosis not present

## 2023-04-07 DIAGNOSIS — N1832 Chronic kidney disease, stage 3b: Secondary | ICD-10-CM | POA: Diagnosis not present

## 2023-04-07 DIAGNOSIS — Z7189 Other specified counseling: Secondary | ICD-10-CM

## 2023-04-07 DIAGNOSIS — Z5181 Encounter for therapeutic drug level monitoring: Secondary | ICD-10-CM

## 2023-04-07 DIAGNOSIS — D496 Neoplasm of unspecified behavior of brain: Secondary | ICD-10-CM | POA: Diagnosis not present

## 2023-04-07 DIAGNOSIS — Z170 Estrogen receptor positive status [ER+]: Secondary | ICD-10-CM | POA: Insufficient documentation

## 2023-04-07 NOTE — Telephone Encounter (Signed)
Called [facility] and spoke to RN about if the pt. Gave the staff the paper we sent back that said that pt d/c keppra medicine and that she needs to be on blood thinner eliquis 10 mg bid for 7 days and then 5 mg bid daily. If they have any issues they can call us. I faxed all the info with orders and signed by dr Smith Robert. [phone] transmission of fax  done

## 2023-04-08 ENCOUNTER — Ambulatory Visit: Admission: RE | Admit: 2023-04-08 | Payer: Medicare Other | Source: Ambulatory Visit

## 2023-04-08 DIAGNOSIS — C7931 Secondary malignant neoplasm of brain: Secondary | ICD-10-CM | POA: Diagnosis not present

## 2023-04-08 NOTE — Progress Notes (Signed)
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  Mammogram and ultrasound in August 2019 showed 0.9 x 0.9 x 0.8 cm irregular spiculated mass in the 11 o'clock position of the right breast.  There was a concern for an abnormal intramammary lymph node as well.  No other abnormal axillary adenopathy noted.  Patient underwent ultrasound-guided biopsy of both the right breast mass as well as the axillary lymph node lymph node biopsy was negative for malignancy.  Biopsy of the right breast mass showed 6 mm, grade 1 ER PR positive and HER-2/neu negative tumor.   Patient is morbidly obese and has difficulty with ambulation.  She uses a cane or a walker to ambulate around the house.  She sees Dr. Mariah Milling for her cardiology issues.  Reports that her appetite is good and she denies any unintentional weight loss or any new aches or pains anywhere.  Family history significant for breast cancer in her sister who died from breast cancer in her 60s.  genetic testing was negative   Final pathology showed 7 mm grade 2 invasive mammary carcinoma ER PR positive her 2 negative. Negative nodes. Clear margins   Oncotype score came as low risk 8 and she does not require  adjuvant chemotherapy She completed adjuvant radiation and started Arimidex in December 2019  Patient was a mated to the hospital in July 2024 with symptoms of right facial droop.  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  This area was not deemed to be resectable or possible to biopsy.  Patient was seen by radiation oncology for empiric radiation.  Also found to have right posterior tibial vein acute DVT during the hospitalization.  CT chest abdomen pelvis without contrast did not show any evidence of metastatic disease.    Interval history-patient resides at [facility].  She gets occasional headaches.  Denies other complaints at this time  ECOG PS- 2-3 Pain scale- 3   Review of systems- Review of Systems  Constitutional:  Negative for chills, fever, malaise/fatigue and weight loss.  HENT:  Negative for congestion, ear discharge and nosebleeds.   Eyes:  Negative for blurred vision.  Respiratory:  Negative for cough, hemoptysis, sputum production, shortness of breath and wheezing.   Cardiovascular:  Negative for chest pain, palpitations, orthopnea and claudication.  Gastrointestinal:  Negative for abdominal pain, blood in stool, constipation, diarrhea, heartburn, melena, nausea and vomiting.  Genitourinary:  Negative for dysuria, flank pain, frequency, hematuria and urgency.  Musculoskeletal:  Negative for back pain, joint pain and myalgias.  Skin:  Negative for rash.  Neurological:  Positive for headaches. Negative for dizziness, tingling, focal weakness, seizures and weakness.  Endo/Heme/Allergies:  Does not  bruise/bleed easily.  Psychiatric/Behavioral:  Negative for depression and suicidal ideas. The patient does not have insomnia.       No Known Allergies   Past Medical History:  Diagnosis Date   Abnormality of gait    Acute on chronic diastolic  CHF (congestive heart failure) (HCC) 11/20/2017   Acute respiratory failure with hypoxia (HCC) 06/13/2022   AKI (acute kidney injury) (HCC) 03/03/2022   Breast cancer (HCC) 02/05/2005   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    Breast cancer (HCC) 2019   right breast   CHF (congestive heart failure) (HCC)    March 28, 2017 echo: EF 50-55%; 1) echo 07/2011: EF 20-25%, mild concentric hypertrophy, diffuse HK, regional WMA cannot be excluded, grade 1 DD, mitral valve with mild regurg, LA mildly dilated). 2) EF 40-45%, mild AS,                     Chronic kidney disease    Chronic kidney disease, stage 3b (HCC) 09/10/2022   Complication of anesthesia 2006   pt states "hard to wake up" after L breast surgery    Edema    GERD (gastroesophageal reflux disease)    Glucose intolerance 09/10/2022   Headache    Heart murmur    Hypertension    Hypertensive emergency 06/13/2022   Hypokinesis    global, EF 35-45 % from Echo.   Lower limb ulcer, calf, left, limited to breakdown of skin (HCC) 12/11/2020   Malignant neoplasm of breast (female), unspecified site 06/14/2018   RIGHT T1b, N0; ER/PR positive, HER-2 negative.   Neuropathy    Obesity    Pain in joint, site unspecified    Personal history of chemotherapy 2006   left breast   Personal history of radiation therapy 2019   right breast   Personal history of radiation therapy 2006   left breast   Pneumonia    Pyuria 08/22/2022   Sleep apnea    Syncope 03/03/2022   Tinea pedis    Unspecified hearing loss    bilateral   Weakness 03/03/2022     Past Surgical History:  Procedure Laterality Date   BREAST BIOPSY Right 2019   11 oc-INVASIVE MAMMARY CARCINOMA, NO SPECIAL TYPE   BREAST BIOPSY Right 2019   1030 oc- neg   BREAST LUMPECTOMY  2006   left breast    CATARACT EXTRACTION     left eye   HERNIA REPAIR  05/25/2006   Incarcerated omentum in  ventral hernia, Ventralight mesh, combined lap/ open with omental resection.    IVC FILTER INSERTION N/A 03/30/2023   Procedure: IVC FILTER INSERTION;  Surgeon: Annice Needy, MD;  Location: [facility] INVASIVE [facility];  Service: Cardiovascular;  Laterality: N/A;   PARTIAL MASTECTOMY WITH NEEDLE LOCALIZATION Right 06/14/2018   Procedure: PARTIAL MASTECTOMY WITH NEEDLE LOCALIZATION;  Surgeon: Earline Mayotte, MD;  Location: [facility] ORS;  Service: General;  Laterality: Right;   PORT-A-CATH REMOVAL  05/25/2006   SENTINEL NODE BIOPSY Right 06/14/2018   Procedure: SENTINEL NODE BIOPSY;  Surgeon: Earline Mayotte, MD;  Location: [facility] ORS;  Service: General;  Laterality: Right;   TUBAL LIGATION      Social History   Socioeconomic History   Marital status: Divorced    Spouse name: Not on file   Number of children: Not on file   Years of education: Not on file   Highest education level: Not on  file  Occupational History   Occupation: retired  Tobacco Use   Smoking status: Former    Current packs/day: 0.00    Average packs/day: 0.5 packs/day for 30.0 years (15.0 ttl pk-yrs)    Types: Cigarettes    Start date: 07/14/1970    Quit date: 07/14/2000    Years since quitting: 22.7   Smokeless tobacco: Never  Vaping Use   Vaping status: Never Used  Substance and Sexual Activity   Alcohol use: No   Drug use: Not Currently    Types: Marijuana    Comment: endorses prior marijuana use 1999   Sexual activity: Never  Other Topics Concern   Not on file  Social History Narrative   Not on file   Social Determinants of Health   Financial Resource Strain: Not on file  Food Insecurity: No Food Insecurity (09/11/2022)   Hunger Vital Sign    Worried About Running Out of Food in the Last Year: Never true    Ran Out of Food in the Last Year: Never true  Transportation Needs: No Transportation Needs (09/11/2022)   PRAPARE - Administrator, Civil Service (Medical): No    Lack of Transportation  (Non-Medical): No  Physical Activity: Not on file  Stress: Not on file  Social Connections: Not on file  Intimate Partner Violence: Not At Risk (09/11/2022)   Humiliation, Afraid, Rape, and Kick questionnaire    Fear of Current or Ex-Partner: No    Emotionally Abused: No    Physically Abused: No    Sexually Abused: No    Family History  Problem Relation Age of Onset   Hypertension Mother        deceased 106   Cataracts Mother    Breast cancer Other 44       maternal half-sister; deceased 48   Coronary artery disease Other      Current Outpatient Medications:    acetaminophen (TYLENOL) 325 MG tablet, Take 2 tablets (650 mg total) by mouth every 6 (six) hours as needed for mild pain (or Fever >/= 101)., Disp: , Rfl:    anastrozole (ARIMIDEX) 1 MG tablet, Take 1 tablet (1 mg total) by mouth daily., Disp: 30 tablet, Rfl: 6   ascorbic acid (VITAMIN C) 500 MG tablet, Take 1 tablet by mouth 2 (two) times daily., Disp: , Rfl:    bisacodyl (DULCOLAX) 5 MG EC tablet, Take 2 tablets (10 mg total) by mouth at bedtime. Skip the dose if no constipation (Patient taking differently: Take 10 mg by mouth 2 (two) times daily. Skip the dose if no constipation), Disp: 30 tablet, Rfl: 0   Calcium Carbonate-Vitamin D 600-5 MG-MCG TABS, Take 1 tablet by mouth 2 (two) times daily., Disp: , Rfl:    dexamethasone (DECADRON) 4 MG tablet, Take 1 tablet (4 mg total) by mouth 2 (two) times daily with a meal., Disp: , Rfl:    diclofenac Sodium (VOLTAREN) 1 % GEL, Apply 2 g topically 2 (two) times daily as needed., Disp: , Rfl:    Emollient (EUCERIN) lotion, Apply topically 2 (two) times daily. Apply to dry skin especially bilateral lower extremities and arms., Disp: 240 mL, Rfl: 0   empagliflozin (JARDIANCE) 10 MG TABS tablet, Take 1 tablet (10 mg total) by mouth daily before breakfast., Disp: 30 tablet, Rfl: 0   ferrous sulfate 325 (65 FE) MG tablet, Take 325 mg by mouth every other day., Disp: , Rfl:    guaiFENesin  (MUCINEX) 600 MG 12 hr  tablet, Take 600 mg by mouth every 12 (twelve) hours., Disp: , Rfl:    HYDROcodone-acetaminophen (NORCO/VICODIN) 5-325 MG tablet, Take 1 tablet by mouth 3 (three) times daily as needed., Disp: 20 tablet, Rfl: 0   Incontinence Supply Disposable (BLADDER CONTROL PADS EX ABSORB) MISC, Apply 1 Dose topically as needed. , Disp: , Rfl:    ipratropium-albuterol (DUONEB) 0.5-2.5 (3) MG/3ML SOLN, Take 3 mLs by nebulization every 4 (four) hours as needed (wheezing)., Disp: , Rfl:    isosorbide mononitrate (IMDUR) 60 MG 24 hr tablet, Take 1 tablet (60 mg total) by mouth 2 (two) times daily., Disp: 60 tablet, Rfl: 0   levETIRAcetam (KEPPRA) 500 MG tablet, Take 1 tablet (500 mg total) by mouth 2 (two) times daily., Disp: , Rfl:    loratadine (CLARITIN) 10 MG tablet, Take 10 mg by mouth daily., Disp: , Rfl:    losartan (COZAAR) 50 MG tablet, Take 50 mg by mouth daily., Disp: , Rfl:    omeprazole (PRILOSEC) 40 MG capsule, Take 40 mg by mouth daily., Disp: , Rfl:    oxybutynin (DITROPAN) 5 MG tablet, Take 5 mg by mouth 2 (two) times daily., Disp: , Rfl:    polyethylene glycol (MIRALAX / GLYCOLAX) 17 g packet, Take 17 g by mouth daily. Skip the dose if no constipation, Disp: 14 each, Rfl: 0   pregabalin (LYRICA) 75 MG capsule, Take 75 mg by mouth daily., Disp: , Rfl:    sennosides-docusate sodium (SENOKOT-S) 8.6-50 MG tablet, Take 1 tablet by mouth 2 (two) times daily., Disp: , Rfl:    spironolactone (ALDACTONE) 25 MG tablet, Take 12.5 mg by mouth daily., Disp: , Rfl:    traMADol HCl 25 MG TABS, Take 1 tablet by mouth every 6 (six) hours as needed., Disp: 30 tablet, Rfl: 0   vitamin B-12 (CYANOCOBALAMIN) 500 MCG tablet, Take 500 mcg by mouth daily., Disp: , Rfl:    Vitamin D, Ergocalciferol, (DRISDOL) 1.25 MG (50000 UNIT) CAPS capsule, Take 50,000 Units by mouth once a week., Disp: , Rfl:    Zinc Oxide (TRIPLE PASTE) 12.8 % ointment, Apply barrier cream to buttocks every shift., Disp: , Rfl:    Physical exam:  Vitals:   04/07/23 1415  Pulse: 69  Resp: 18  Temp: (!) 96.9 F (36.1 C)  TempSrc: Tympanic  SpO2: 100%  Weight: 232 lb (105.2 kg)  Height: 5\' 9"  (1.753 m)   Physical Exam Constitutional:      Comments: Sitting in a wheelchair.  Appears frail  Cardiovascular:     Rate and Rhythm: Normal rate and regular rhythm.     Heart sounds: Normal heart sounds.  Pulmonary:     Effort: Pulmonary effort is normal.     Breath sounds: Normal breath sounds.  Abdominal:     General: Bowel sounds are normal.     Palpations: Abdomen is soft.  Skin:    General: Skin is warm and dry.  Neurological:     Mental Status: She is alert and oriented to person, place, and time.         Latest Ref Rng & Units 03/30/2023    5:42 AM  CMP  Glucose 70 - 99 mg/dL 308   BUN 8 - 23 mg/dL 27   Creatinine 6.57 - 1.00 mg/dL 8.46   Sodium 962 - 952 mmol/L 140   Potassium 3.5 - 5.1 mmol/L 4.2   Chloride 98 - 111 mmol/L 103   CO2 22 - 32 mmol/L 29   Calcium  8.9 - 10.3 mg/dL 8.8   Total Protein 6.5 - 8.1 g/dL 7.3   Total Bilirubin 0.3 - 1.2 mg/dL 0.5   Alkaline Phos 38 - 126 U/L 54   AST 15 - 41 U/L 13   ALT 0 - 44 U/L 12       Latest Ref Rng & Units 03/30/2023    5:42 AM  CBC  WBC 4.0 - 10.5 K/uL 5.8   Hemoglobin 12.0 - 15.0 g/dL 16.1   Hematocrit 09.6 - 46.0 % 36.0   Platelets 150 - 400 K/uL 277     No images are attached to the encounter.  PERIPHERAL VASCULAR CATHETERIZATION  Result Date: 03/30/2023 See surgical note for result.  CT CHEST ABDOMEN PELVIS WO CONTRAST  Result Date: 03/30/2023 CLINICAL DATA:  Generalized weakness and lethargy. Evaluate for metastatic disease. EXAM: CT CHEST, ABDOMEN AND PELVIS WITHOUT CONTRAST TECHNIQUE: Multidetector CT imaging of the chest, abdomen and pelvis was performed following the standard protocol without IV contrast. RADIATION DOSE REDUCTION: This exam was performed according to the departmental dose-optimization program which  includes automated exposure control, adjustment of the mA and/or kV according to patient size and/or use of iterative reconstruction technique. COMPARISON:  CT abdomen pelvis dated 09/11/2022. FINDINGS: Evaluation of this exam is limited in the absence of intravenous contrast. CT CHEST FINDINGS Cardiovascular: There is mild cardiomegaly. No pericardial effusion. There is coronary vascular calcification. Mild atherosclerotic calcification of the thoracic aorta. No aneurysmal dilatation. Mild dilatation of the main pulmonary trunk suggestive of pulmonary hypertension. Clinical correlation is recommended. Mediastinum/Nodes: No hilar or mediastinal adenopathy. The esophagus is grossly unremarkable. Multiple bilateral thyroid nodules measure up to 2 cm on the right. This has been evaluated on previous imaging. (ref: J Am Coll Radiol. 2015 Feb;12(2): 143-50).No mediastinal fluid collection. Lungs/Pleura: No focal consolidation, pleural effusion, or pneumothorax. The central airways are patent. Musculoskeletal: Degenerative changes of the spine. No acute osseous pathology. CT ABDOMEN PELVIS FINDINGS No intra-abdominal free air or free fluid. Hepatobiliary: Subcentimeter hypodense lesion in the right lobe of the liver is too small to characterize but demonstrates fluid attenuation and similar to prior CT, most consistent with a cyst. The liver is otherwise unremarkable. No biliary dilatation. The gallbladder is unremarkable. Pancreas: Unremarkable. No pancreatic ductal dilatation or surrounding inflammatory changes. Spleen: Normal in size without focal abnormality. Adrenals/Urinary Tract: The adrenal glands are unremarkable. There is a 4.5 cm right renal upper pole cyst. A 2 point cm high attenuating/solid-appearing lesion from the lateral mid to lower pole of the right kidney is not characterized on this CT. Further characterization with MRI without and with contrast on a nonemergent/outpatient basis recommended. There is  no hydronephrosis or nephrolithiasis on either side. The urinary bladder is grossly unremarkable. Stomach/Bowel: There is no bowel obstruction or active inflammation. The appendix is not visualized with certainty. No inflammatory changes identified in the right lower quadrant. Vascular/Lymphatic: Mild aortoiliac atherosclerotic disease. The IVC is unremarkable. No portal venous gas. There is no adenopathy. Reproductive: Myomatous uterus.  No adnexal masses. Other: Midline anterior pelvic wall hernia repair. There is a small fat containing umbilical hernia. Musculoskeletal: Degenerative changes of the spine and right hip. No acute osseous pathology. IMPRESSION: 1. No acute intrathoracic, abdominal, or pelvic pathology. No evidence of metastatic disease. 2. Indeterminate 2 cm lesion from the lateral mid to lower pole of the right kidney. Further characterization with MRI without and with contrast on a nonemergent/outpatient basis recommended. 3. Aortic Atherosclerosis (ICD10-I70.0). Electronically Signed   By:  Elgie Collard M.D.   On: 03/30/2023 02:11   US Venous Img Lower Bilateral (DVT)  Result Date: 03/30/2023 CLINICAL DATA:  Bilateral lower extremity pain and swelling, history of deep venous thrombosis. EXAM: BILATERAL LOWER EXTREMITY VENOUS DOPPLER ULTRASOUND TECHNIQUE: Gray-scale sonography with graded compression, as well as color Doppler and duplex ultrasound were performed to evaluate the lower extremity deep venous systems from the level of the common femoral vein and including the common femoral, femoral, profunda femoral, popliteal and calf veins including the posterior tibial, peroneal and gastrocnemius veins when visible. The superficial great saphenous vein was also interrogated. Spectral Doppler was utilized to evaluate flow at rest and with distal augmentation maneuvers in the common femoral, femoral and popliteal veins. COMPARISON:  None Available. FINDINGS: RIGHT LOWER EXTREMITY Common  Femoral Vein: No evidence of thrombus. Normal compressibility, respiratory phasicity and response to augmentation. Saphenofemoral Junction: No evidence of thrombus. Normal compressibility and flow on color Doppler imaging. Profunda Femoral Vein: No evidence of thrombus. Normal compressibility and flow on color Doppler imaging. Femoral Vein: No evidence of thrombus. Normal compressibility, respiratory phasicity and response to augmentation. Popliteal Vein: No evidence of thrombus. Normal compressibility, respiratory phasicity and response to augmentation. Calf Veins: Thrombus is noted in the posterior tibial vein. The peroneal vein is not well visualized. Superficial Great Saphenous Vein: No evidence of thrombus. Normal compressibility. Venous Reflux:  None. Other Findings:  None. LEFT LOWER EXTREMITY Common Femoral Vein: No evidence of thrombus. Normal compressibility, respiratory phasicity and response to augmentation. Saphenofemoral Junction: No evidence of thrombus. Normal compressibility and flow on color Doppler imaging. Profunda Femoral Vein: No evidence of thrombus. Normal compressibility and flow on color Doppler imaging. Femoral Vein: No evidence of thrombus. Normal compressibility, respiratory phasicity and response to augmentation. Popliteal Vein: No evidence of thrombus. Normal compressibility, respiratory phasicity and response to augmentation. Calf Veins: Calf veins are not well visualized. Superficial Great Saphenous Vein: No evidence of thrombus. Normal compressibility. Venous Reflux:  None. Other Findings:  None. IMPRESSION: Thrombus noted in the right posterior tibial vein. No other focal abnormality is noted. Electronically Signed   By: Alcide Clever M.D.   On: 03/30/2023 01:08   MR BRAIN W WO CONTRAST  Result Date: 03/29/2023 CLINICAL DATA:  right facial droop. EXAM: MRI HEAD WITHOUT AND WITH CONTRAST TECHNIQUE: Multiplanar, multiecho pulse sequences of the brain and surrounding structures  were obtained without and with intravenous contrast. CONTRAST:  10mL GADAVIST GADOBUTROL 1 MMOL/ML IV SOLN COMPARISON:  Brain MRI 03/03/2022. FINDINGS: Brain: Peripherally enhancing mass with central nonenhancement along the gray-white junction of the right cingulate gyrus, measuring up to 13 mm (axial image 109 series 20) with surrounding vasogenic edema. No other foci of abnormal enhancement in the brain. No acute infarct or hemorrhage. Mild chronic small-vessel disease. No hydrocephalus or extra-axial collection. Vascular: Normal flow voids and vessel enhancement. Skull and upper cervical spine: Normal marrow signal and enhancement. Sinuses/Orbits: Unremarkable. Other: None. IMPRESSION: Peripherally enhancing mass with central nonenhancement along the gray-white junction of the right cingulate gyrus, measuring up to 13 mm with surrounding vasogenic edema. 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 Electronically Signed   By: [name] M.D.   On: 03/29/2023 18:11     Assessment and plan- Patient is a 77 y.o. female with history of stage I ER/PR positive HER2 negative breast cancer diagnosed in August 2019 s/p surgery and adjuvant radiation therapy and currently on Arimidex.  She did not require adjuvant chemotherapy.  She is here for posthospital  discharge follow-up   Brain mass: Patient was recently admitted to the hospitalWith symptoms of right facial droop and was found to have a 13 mm enhancing mass in the gray-white junction with surrounding vasogenic edema concerning for metastatic disease.  The site was not amenable to biopsy or resection.  As such we are presuming that this is potentially metastatic breast cancer and patient is getting radiation treatment for the same.  She is on Decadron.  It is unclear as to why patient was discharged on Keppra as this was not recommended by neurosurgery.  I did get in touch with Dr. Ernestine Mcmurray who is okay with stopping Keppra at this time without any  taper.  We will inform [facility] about this.  Decadron taper will be decided by Dr. Aggie Cosier.  Breast cancer: CT chest abdomen pelvis without contrast did not show any evidence of metastatic disease.  I am getting a bone scan at this time.  If that does not show metastatic disease as well and I am inclined to continue with Arimidex at this time since we do not have any tissue diagnosis to prove that this is truly metastatic breast cancer.  Moreover patient is elderly and frail and I do not see a reason to start Ibrance at this time in the absence of systemic disease.I will see her back in 2 to 3 months with CBC with differential CMP CA 27-29 and CA 15-3.  Patient was found to have an acuteRight posterior tibial vein DVT during her hospitalization and July 2024.  Patient had an IVC filter presumably due to presence of brain mass although there was no concern for bleeding into the brain mass.  I do think that her IVC filter needs to come out within the next 3 to 6 months as there is no real indication to have 1.  I have recommended that patient should be started on Eliquis upon discharge but that was not done.  We will have her take Eliquis at this time   Visit Diagnosis 1. 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 Mass of lower outer quadrant of right breast   3. Visit for monitoring Arimidex therapy   4. Goals of care, counseling/discussion      Dr. Owens Shark, MD, MPH [facility] [phone] 04/08/2023 8:55 AM

## 2023-04-10 ENCOUNTER — Other Ambulatory Visit: Payer: Self-pay | Admitting: *Deleted

## 2023-04-10 DIAGNOSIS — D496 Neoplasm of unspecified behavior of brain: Secondary | ICD-10-CM

## 2023-04-11 ENCOUNTER — Ambulatory Visit
Admission: RE | Admit: 2023-04-11 | Discharge: 2023-04-11 | Disposition: A | Discharge status: Home / Self Care | Payer: Medicare Other | Source: Ambulatory Visit | Attending: Radiation Oncology | Admitting: Radiation Oncology

## 2023-04-11 DIAGNOSIS — Z853 Personal history of malignant neoplasm of breast: Secondary | ICD-10-CM | POA: Diagnosis not present

## 2023-04-11 DIAGNOSIS — C7931 Secondary malignant neoplasm of brain: Secondary | ICD-10-CM | POA: Diagnosis not present

## 2023-04-13 DIAGNOSIS — C7931 Secondary malignant neoplasm of brain: Secondary | ICD-10-CM | POA: Diagnosis not present

## 2023-04-15 ENCOUNTER — Encounter: Admission: RE | Admit: 2023-04-15 | Payer: Medicare Other | Source: Ambulatory Visit

## 2023-04-15 ENCOUNTER — Ambulatory Visit: Payer: Medicare Other

## 2023-04-15 ENCOUNTER — Ambulatory Visit
Admission: RE | Admit: 2023-04-15 | Discharge: 2023-04-15 | Disposition: A | Discharge status: Home / Self Care | Payer: Medicare Other | Source: Ambulatory Visit | Attending: Oncology | Admitting: Oncology

## 2023-04-15 DIAGNOSIS — N6313 Unspecified lump in the right breast, lower outer quadrant: Secondary | ICD-10-CM | POA: Insufficient documentation

## 2023-04-15 DIAGNOSIS — K5651 Intestinal adhesions [bands], with partial obstruction: Secondary | ICD-10-CM | POA: Diagnosis not present

## 2023-04-15 DIAGNOSIS — D496 Neoplasm of unspecified behavior of brain: Secondary | ICD-10-CM | POA: Insufficient documentation

## 2023-04-15 DIAGNOSIS — K5652 Intestinal adhesions [bands] with complete obstruction: Secondary | ICD-10-CM | POA: Diagnosis not present

## 2023-04-15 MED ORDER — TECHNETIUM TC 99M MEDRONATE IV KIT
23.5700 | PACK | Freq: Once | INTRAVENOUS | Status: AC | PRN
  Administered 2023-04-15: 23.57 via INTRAVENOUS

## 2023-04-16 ENCOUNTER — Other Ambulatory Visit: Payer: Self-pay

## 2023-04-16 DIAGNOSIS — C7931 Secondary malignant neoplasm of brain: Secondary | ICD-10-CM | POA: Diagnosis not present

## 2023-04-16 LAB — RAD ONC ARIA SESSION SUMMARY
Course Elapsed Days: 0
Plan Fractions Treated to Date: 1
Plan Prescribed Dose Per Fraction: 6 Gy
Plan Total Fractions Prescribed: 5
Plan Total Prescribed Dose: 30 Gy
Reference Point Dosage Given to Date: 6 Gy
Reference Point Session Dosage Given: 6 Gy
Session Number: 1

## 2023-04-17 ENCOUNTER — Ambulatory Visit: Admission: RE | Admit: 2023-04-17 | Payer: Medicare Other | Source: Ambulatory Visit

## 2023-04-17 ENCOUNTER — Other Ambulatory Visit: Payer: Self-pay

## 2023-04-17 DIAGNOSIS — C7931 Secondary malignant neoplasm of brain: Secondary | ICD-10-CM | POA: Diagnosis not present

## 2023-04-17 LAB — RAD ONC ARIA SESSION SUMMARY
Course Elapsed Days: 1
Plan Fractions Treated to Date: 2
Plan Prescribed Dose Per Fraction: 6 Gy
Plan Total Fractions Prescribed: 5
Plan Total Prescribed Dose: 30 Gy
Reference Point Dosage Given to Date: 12 Gy
Reference Point Session Dosage Given: 6 Gy
Session Number: 2

## 2023-04-18 ENCOUNTER — Inpatient Hospital Stay: Payer: Medicare Other

## 2023-04-18 ENCOUNTER — Other Ambulatory Visit: Payer: Self-pay

## 2023-04-18 ENCOUNTER — Emergency Department: Payer: Medicare Other

## 2023-04-18 ENCOUNTER — Inpatient Hospital Stay
Admission: EM | Admit: 2023-04-18 | Discharge: 2023-04-28 | DRG: 335 | Disposition: A | Discharge status: Skilled Nursing Facility | Payer: Medicare Other | Source: Skilled Nursing Facility | Attending: Internal Medicine | Admitting: Internal Medicine

## 2023-04-18 DIAGNOSIS — K5652 Intestinal adhesions [bands] with complete obstruction: Principal | ICD-10-CM | POA: Diagnosis present

## 2023-04-18 DIAGNOSIS — D72829 Elevated white blood cell count, unspecified: Secondary | ICD-10-CM

## 2023-04-18 DIAGNOSIS — Z7901 Long term (current) use of anticoagulants: Secondary | ICD-10-CM | POA: Diagnosis not present

## 2023-04-18 DIAGNOSIS — E870 Hyperosmolality and hypernatremia: Secondary | ICD-10-CM | POA: Diagnosis not present

## 2023-04-18 DIAGNOSIS — D62 Acute posthemorrhagic anemia: Secondary | ICD-10-CM | POA: Insufficient documentation

## 2023-04-18 DIAGNOSIS — Z8701 Personal history of pneumonia (recurrent): Secondary | ICD-10-CM

## 2023-04-18 DIAGNOSIS — Z8673 Personal history of transient ischemic attack (TIA), and cerebral infarction without residual deficits: Secondary | ICD-10-CM

## 2023-04-18 DIAGNOSIS — Z170 Estrogen receptor positive status [ER+]: Secondary | ICD-10-CM

## 2023-04-18 DIAGNOSIS — Z9221 Personal history of antineoplastic chemotherapy: Secondary | ICD-10-CM

## 2023-04-18 DIAGNOSIS — K219 Gastro-esophageal reflux disease without esophagitis: Secondary | ICD-10-CM | POA: Diagnosis present

## 2023-04-18 DIAGNOSIS — Z7401 Bed confinement status: Secondary | ICD-10-CM

## 2023-04-18 DIAGNOSIS — D447 Neoplasm of uncertain behavior of aortic body and other paraganglia: Secondary | ICD-10-CM | POA: Insufficient documentation

## 2023-04-18 DIAGNOSIS — E876 Hypokalemia: Secondary | ICD-10-CM | POA: Diagnosis not present

## 2023-04-18 DIAGNOSIS — E669 Obesity, unspecified: Secondary | ICD-10-CM | POA: Diagnosis present

## 2023-04-18 DIAGNOSIS — K56609 Unspecified intestinal obstruction, unspecified as to partial versus complete obstruction: Secondary | ICD-10-CM | POA: Diagnosis not present

## 2023-04-18 DIAGNOSIS — D496 Neoplasm of unspecified behavior of brain: Secondary | ICD-10-CM | POA: Diagnosis not present

## 2023-04-18 DIAGNOSIS — G4733 Obstructive sleep apnea (adult) (pediatric): Secondary | ICD-10-CM | POA: Diagnosis present

## 2023-04-18 DIAGNOSIS — G629 Polyneuropathy, unspecified: Secondary | ICD-10-CM | POA: Diagnosis present

## 2023-04-18 DIAGNOSIS — C50911 Malignant neoplasm of unspecified site of right female breast: Secondary | ICD-10-CM | POA: Diagnosis not present

## 2023-04-18 DIAGNOSIS — K920 Hematemesis: Secondary | ICD-10-CM | POA: Diagnosis not present

## 2023-04-18 DIAGNOSIS — C7931 Secondary malignant neoplasm of brain: Secondary | ICD-10-CM | POA: Diagnosis not present

## 2023-04-18 DIAGNOSIS — Z7984 Long term (current) use of oral hypoglycemic drugs: Secondary | ICD-10-CM | POA: Diagnosis not present

## 2023-04-18 DIAGNOSIS — Z79811 Long term (current) use of aromatase inhibitors: Secondary | ICD-10-CM

## 2023-04-18 DIAGNOSIS — Z7952 Long term (current) use of systemic steroids: Secondary | ICD-10-CM | POA: Diagnosis not present

## 2023-04-18 DIAGNOSIS — R509 Fever, unspecified: Secondary | ICD-10-CM | POA: Diagnosis not present

## 2023-04-18 DIAGNOSIS — Z923 Personal history of irradiation: Secondary | ICD-10-CM

## 2023-04-18 DIAGNOSIS — I82441 Acute embolism and thrombosis of right tibial vein: Secondary | ICD-10-CM | POA: Diagnosis present

## 2023-04-18 DIAGNOSIS — N1832 Chronic kidney disease, stage 3b: Secondary | ICD-10-CM | POA: Diagnosis present

## 2023-04-18 DIAGNOSIS — E162 Hypoglycemia, unspecified: Secondary | ICD-10-CM | POA: Diagnosis not present

## 2023-04-18 DIAGNOSIS — I5022 Chronic systolic (congestive) heart failure: Secondary | ICD-10-CM

## 2023-04-18 DIAGNOSIS — K5651 Intestinal adhesions [bands], with partial obstruction: Secondary | ICD-10-CM | POA: Diagnosis present

## 2023-04-18 DIAGNOSIS — N1831 Chronic kidney disease, stage 3a: Secondary | ICD-10-CM | POA: Diagnosis present

## 2023-04-18 DIAGNOSIS — Z803 Family history of malignant neoplasm of breast: Secondary | ICD-10-CM

## 2023-04-18 DIAGNOSIS — Z79899 Other long term (current) drug therapy: Secondary | ICD-10-CM

## 2023-04-18 DIAGNOSIS — G9341 Metabolic encephalopathy: Secondary | ICD-10-CM | POA: Diagnosis present

## 2023-04-18 DIAGNOSIS — K5909 Other constipation: Secondary | ICD-10-CM | POA: Diagnosis present

## 2023-04-18 DIAGNOSIS — R7989 Other specified abnormal findings of blood chemistry: Secondary | ICD-10-CM | POA: Diagnosis not present

## 2023-04-18 DIAGNOSIS — Z8249 Family history of ischemic heart disease and other diseases of the circulatory system: Secondary | ICD-10-CM

## 2023-04-18 DIAGNOSIS — Z6831 Body mass index (BMI) 31.0-31.9, adult: Secondary | ICD-10-CM

## 2023-04-18 DIAGNOSIS — I130 Hypertensive heart and chronic kidney disease with heart failure and stage 1 through stage 4 chronic kidney disease, or unspecified chronic kidney disease: Secondary | ICD-10-CM | POA: Diagnosis present

## 2023-04-18 DIAGNOSIS — H9193 Unspecified hearing loss, bilateral: Secondary | ICD-10-CM | POA: Diagnosis present

## 2023-04-18 DIAGNOSIS — I5032 Chronic diastolic (congestive) heart failure: Secondary | ICD-10-CM | POA: Diagnosis present

## 2023-04-18 DIAGNOSIS — Z87891 Personal history of nicotine dependence: Secondary | ICD-10-CM

## 2023-04-18 DIAGNOSIS — D484 Neoplasm of uncertain behavior of peritoneum: Secondary | ICD-10-CM | POA: Diagnosis present

## 2023-04-18 DIAGNOSIS — J690 Pneumonitis due to inhalation of food and vomit: Secondary | ICD-10-CM | POA: Diagnosis present

## 2023-04-18 DIAGNOSIS — N390 Urinary tract infection, site not specified: Secondary | ICD-10-CM | POA: Diagnosis present

## 2023-04-18 DIAGNOSIS — I82409 Acute embolism and thrombosis of unspecified deep veins of unspecified lower extremity: Secondary | ICD-10-CM | POA: Diagnosis present

## 2023-04-18 LAB — CBC
HCT: 40.5 % (ref 36.0–46.0)
HCT: 42.6 % (ref 36.0–46.0)
HCT: 44.5 % (ref 36.0–46.0)
Hemoglobin: 13.1 g/dL (ref 12.0–15.0)
Hemoglobin: 13.5 g/dL (ref 12.0–15.0)
Hemoglobin: 13.8 g/dL (ref 12.0–15.0)
MCH: 26.6 pg (ref 26.0–34.0)
MCH: 27 pg (ref 26.0–34.0)
MCH: 27.3 pg (ref 26.0–34.0)
MCHC: 31 g/dL (ref 30.0–36.0)
MCHC: 31.7 g/dL (ref 30.0–36.0)
MCHC: 32.3 g/dL (ref 30.0–36.0)
MCV: 83.3 fL (ref 80.0–100.0)
MCV: 85.9 fL (ref 80.0–100.0)
MCV: 86.2 fL (ref 80.0–100.0)
Platelets: 308 10*3/uL (ref 150–400)
Platelets: 310 10*3/uL (ref 150–400)
Platelets: 334 10*3/uL (ref 150–400)
RBC: 4.86 MIL/uL (ref 3.87–5.11)
RBC: 4.94 MIL/uL (ref 3.87–5.11)
RBC: 5.18 MIL/uL — ABNORMAL HIGH (ref 3.87–5.11)
RDW: 16.2 % — ABNORMAL HIGH (ref 11.5–15.5)
RDW: 16.3 % — ABNORMAL HIGH (ref 11.5–15.5)
RDW: 16.5 % — ABNORMAL HIGH (ref 11.5–15.5)
WBC: 13.1 10*3/uL — ABNORMAL HIGH (ref 4.0–10.5)
WBC: 13.3 10*3/uL — ABNORMAL HIGH (ref 4.0–10.5)
WBC: 14.2 10*3/uL — ABNORMAL HIGH (ref 4.0–10.5)
nRBC: 0 % (ref 0.0–0.2)
nRBC: 0 % (ref 0.0–0.2)
nRBC: 0 % (ref 0.0–0.2)

## 2023-04-18 LAB — COMPREHENSIVE METABOLIC PANEL
ALT: 11 U/L (ref 0–44)
AST: 11 U/L — ABNORMAL LOW (ref 15–41)
Albumin: 3.4 g/dL — ABNORMAL LOW (ref 3.5–5.0)
Alkaline Phosphatase: 51 U/L (ref 38–126)
Anion gap: 14 (ref 5–15)
BUN: 43 mg/dL — ABNORMAL HIGH (ref 8–23)
CO2: 27 mmol/L (ref 22–32)
Calcium: 8.6 mg/dL — ABNORMAL LOW (ref 8.9–10.3)
Chloride: 98 mmol/L (ref 98–111)
Creatinine, Ser: 1.08 mg/dL — ABNORMAL HIGH (ref 0.44–1.00)
GFR, Estimated: 53 mL/min — ABNORMAL LOW (ref >60)
Glucose, Bld: 107 mg/dL — ABNORMAL HIGH (ref 70–99)
Potassium: 4.1 mmol/L (ref 3.5–5.1)
Sodium: 139 mmol/L (ref 135–145)
Total Bilirubin: 1.5 mg/dL — ABNORMAL HIGH (ref 0.3–1.2)
Total Protein: 7.4 g/dL (ref 6.5–8.1)

## 2023-04-18 LAB — TROPONIN I (HIGH SENSITIVITY)
Troponin I (High Sensitivity): 27 ng/L — ABNORMAL HIGH (ref <18)
Troponin I (High Sensitivity): 28 ng/L — ABNORMAL HIGH (ref <18)

## 2023-04-18 LAB — TYPE AND SCREEN

## 2023-04-18 LAB — LIPASE, BLOOD: Lipase: 24 U/L (ref 11–51)

## 2023-04-18 LAB — CREATININE, SERUM
Creatinine, Ser: 1.05 mg/dL — ABNORMAL HIGH (ref 0.44–1.00)
GFR, Estimated: 55 mL/min — ABNORMAL LOW (ref >60)

## 2023-04-18 MED ORDER — SENNOSIDES-DOCUSATE SODIUM 8.6-50 MG PO TABS
1.0000 | ORAL_TABLET | Freq: Every evening | ORAL | Status: DC | PRN
  Administered 2023-04-26: 1 via ORAL
  Filled 2023-04-18: qty 1

## 2023-04-18 MED ORDER — PREGABALIN 75 MG PO CAPS
75.0000 mg | ORAL_CAPSULE | Freq: Every day | ORAL | Status: DC
  Administered 2023-04-18 – 2023-04-28 (×10): 75 mg via ORAL
  Filled 2023-04-18 (×11): qty 1

## 2023-04-18 MED ORDER — HYDROCERIN EX CREA: 1.0000 | TOPICAL_CREAM | Freq: Two times a day (BID) | CUTANEOUS | Status: DC | PRN

## 2023-04-18 MED ORDER — GUAIFENESIN ER 600 MG PO TB12: 600.0000 mg | ORAL_TABLET | Freq: Two times a day (BID) | ORAL | Status: DC | PRN

## 2023-04-18 MED ORDER — SPIRONOLACTONE 12.5 MG HALF TABLET
12.5000 mg | ORAL_TABLET | Freq: Every day | ORAL | Status: DC
  Administered 2023-04-18 – 2023-04-28 (×10): 12.5 mg via ORAL
  Filled 2023-04-18 (×11): qty 1

## 2023-04-18 MED ORDER — TRAZODONE HCL 50 MG PO TABS
25.0000 mg | ORAL_TABLET | Freq: Every evening | ORAL | Status: DC | PRN
  Administered 2023-04-20 – 2023-04-26 (×3): 25 mg via ORAL
  Filled 2023-04-18 (×3): qty 1

## 2023-04-18 MED ORDER — IPRATROPIUM-ALBUTEROL 0.5-2.5 (3) MG/3ML IN SOLN
3.0000 mL | RESPIRATORY_TRACT | Status: DC | PRN
  Filled 2023-04-18: qty 3

## 2023-04-18 MED ORDER — OXYBUTYNIN CHLORIDE 5 MG PO TABS
5.0000 mg | ORAL_TABLET | Freq: Two times a day (BID) | ORAL | Status: DC
  Administered 2023-04-18 – 2023-04-28 (×20): 5 mg via ORAL
  Filled 2023-04-18 (×21): qty 1

## 2023-04-18 MED ORDER — IOHEXOL 350 MG/ML SOLN
100.0000 mL | Freq: Once | INTRAVENOUS | Status: AC | PRN
  Administered 2023-04-18: 100 mL via INTRAVENOUS

## 2023-04-18 MED ORDER — ANASTROZOLE 1 MG PO TABS
1.0000 mg | ORAL_TABLET | Freq: Every day | ORAL | Status: DC
  Administered 2023-04-18 – 2023-04-28 (×10): 1 mg via ORAL
  Filled 2023-04-18 (×11): qty 1

## 2023-04-18 MED ORDER — TRAMADOL HCL 25 MG PO TABS: 1.0000 | ORAL_TABLET | Freq: Four times a day (QID) | ORAL | Status: DC | PRN

## 2023-04-18 MED ORDER — VITAMIN C 500 MG PO TABS
500.0000 mg | ORAL_TABLET | Freq: Two times a day (BID) | ORAL | Status: DC
  Administered 2023-04-18 – 2023-04-28 (×20): 500 mg via ORAL
  Filled 2023-04-18 (×21): qty 1

## 2023-04-18 MED ORDER — PANTOPRAZOLE SODIUM 40 MG IV SOLR
40.0000 mg | Freq: Two times a day (BID) | INTRAVENOUS | Status: DC
  Administered 2023-04-18 – 2023-04-25 (×15): 40 mg via INTRAVENOUS
  Filled 2023-04-18 (×15): qty 10

## 2023-04-18 MED ORDER — DICLOFENAC SODIUM 1 % EX GEL: 2.0000 g | Freq: Two times a day (BID) | CUTANEOUS | Status: DC | PRN

## 2023-04-18 MED ORDER — ONDANSETRON HCL 4 MG/2ML IJ SOLN
4.0000 mg | Freq: Once | INTRAMUSCULAR | Status: AC | PRN
  Administered 2023-04-18: 4 mg via INTRAVENOUS
  Filled 2023-04-18: qty 2

## 2023-04-18 MED ORDER — LORATADINE 10 MG PO TABS
10.0000 mg | ORAL_TABLET | Freq: Every day | ORAL | Status: DC
  Administered 2023-04-18 – 2023-04-28 (×10): 10 mg via ORAL
  Filled 2023-04-18 (×11): qty 1

## 2023-04-18 MED ORDER — ISOSORBIDE MONONITRATE ER 30 MG PO TB24
60.0000 mg | ORAL_TABLET | Freq: Two times a day (BID) | ORAL | Status: DC
  Administered 2023-04-18 – 2023-04-28 (×20): 60 mg via ORAL
  Filled 2023-04-18 (×21): qty 2

## 2023-04-18 MED ORDER — PANTOPRAZOLE 80MG IVPB - SIMPLE MED
80.0000 mg | Freq: Once | INTRAVENOUS | Status: AC
  Administered 2023-04-18: 80 mg via INTRAVENOUS
  Filled 2023-04-18: qty 100

## 2023-04-18 MED ORDER — LEVETIRACETAM 500 MG PO TABS
500.0000 mg | ORAL_TABLET | Freq: Two times a day (BID) | ORAL | Status: DC
  Administered 2023-04-18 – 2023-04-19 (×3): 500 mg via ORAL
  Filled 2023-04-18 (×3): qty 1

## 2023-04-18 MED ORDER — DIATRIZOATE MEGLUMINE & SODIUM 66-10 % PO SOLN
90.0000 mL | Freq: Once | ORAL | Status: AC
  Administered 2023-04-18: 90 mL via NASOGASTRIC

## 2023-04-18 MED ORDER — LOSARTAN POTASSIUM 50 MG PO TABS
50.0000 mg | ORAL_TABLET | Freq: Every day | ORAL | Status: DC
  Administered 2023-04-18 – 2023-04-28 (×10): 50 mg via ORAL
  Filled 2023-04-18 (×11): qty 1

## 2023-04-18 MED ORDER — SODIUM CHLORIDE 0.9 % IV SOLN: INTRAVENOUS | Status: DC

## 2023-04-18 MED ORDER — ACETAMINOPHEN 325 MG PO TABS: 650.0000 mg | ORAL_TABLET | Freq: Four times a day (QID) | ORAL | Status: DC | PRN

## 2023-04-18 MED ORDER — SODIUM CHLORIDE 0.9 % IV BOLUS
500.0000 mL | Freq: Once | INTRAVENOUS | Status: AC
  Administered 2023-04-18: 500 mL via INTRAVENOUS

## 2023-04-18 MED ORDER — MORPHINE SULFATE (PF) 2 MG/ML IV SOLN
2.0000 mg | INTRAVENOUS | Status: DC | PRN
  Administered 2023-04-18 – 2023-04-21 (×8): 2 mg via INTRAVENOUS
  Filled 2023-04-18 (×8): qty 1

## 2023-04-18 MED ORDER — ONDANSETRON HCL 4 MG PO TABS: 4.0000 mg | ORAL_TABLET | Freq: Four times a day (QID) | ORAL | Status: DC | PRN

## 2023-04-18 MED ORDER — DEXAMETHASONE 4 MG PO TABS
4.0000 mg | ORAL_TABLET | Freq: Two times a day (BID) | ORAL | Status: DC
  Administered 2023-04-18 – 2023-04-28 (×19): 4 mg via ORAL
  Filled 2023-04-18 (×20): qty 1

## 2023-04-18 MED ORDER — EMPAGLIFLOZIN 10 MG PO TABS
10.0000 mg | ORAL_TABLET | Freq: Every day | ORAL | Status: DC
  Filled 2023-04-18: qty 1

## 2023-04-18 MED ORDER — HYDROCODONE-ACETAMINOPHEN 5-325 MG PO TABS
1.0000 | ORAL_TABLET | Freq: Three times a day (TID) | ORAL | Status: DC | PRN
  Administered 2023-04-21 – 2023-04-25 (×6): 1 via ORAL
  Filled 2023-04-18 (×7): qty 1

## 2023-04-18 MED ORDER — ONDANSETRON HCL 4 MG/2ML IJ SOLN
4.0000 mg | Freq: Four times a day (QID) | INTRAMUSCULAR | Status: DC | PRN
  Administered 2023-04-18 – 2023-04-26 (×5): 4 mg via INTRAVENOUS
  Filled 2023-04-18 (×5): qty 2

## 2023-04-18 MED ORDER — ENOXAPARIN SODIUM 60 MG/0.6ML IJ SOSY: 50.0000 mg | PREFILLED_SYRINGE | INTRAMUSCULAR | Status: DC

## 2023-04-18 MED ORDER — ACETAMINOPHEN 650 MG RE SUPP: 650.0000 mg | Freq: Four times a day (QID) | RECTAL | Status: DC | PRN

## 2023-04-18 MED ORDER — ZINC OXIDE 20 % EX OINT: 1.0000 | TOPICAL_OINTMENT | CUTANEOUS | Status: DC | PRN

## 2023-04-18 NOTE — Assessment & Plan Note (Signed)
-   This could be related to stress demargination. - We will obtain a urinalysis.

## 2023-04-18 NOTE — ED Triage Notes (Signed)
First Nurse Note:  BIB [facility] from [facility]. Pt c/o abd pain with dark colored emesis x 1 wk. Described as projectile multiple times a day and unable to keep anything down. Epigastric and generalized abd tender to touch. Pt recently started radiation for brain cancer on Monday. No bm in 4 days so no report of dark/tarry stools. 4 mg zofran given en route and 18 LAC PIV started by EMS. EMS reports pt alert and oriented. No emesis with EMS  EMS VS:  93 HR  93% RA 141/56 RR 18 CBG 131

## 2023-04-18 NOTE — ED Notes (Signed)
ED Provider at bedside. 

## 2023-04-18 NOTE — ED Notes (Signed)
Holding pt at this time. Inpt RN uncomfortable with taking pt w/o ng. Consulting civil engineer notified.

## 2023-04-18 NOTE — Assessment & Plan Note (Addendum)
Resume eliquis as outpatient

## 2023-04-18 NOTE — Plan of Care (Signed)

## 2023-04-18 NOTE — Assessment & Plan Note (Addendum)
Secondary to nausea vomiting earlier in course.  Hemoglobin stable..  On Protonix.

## 2023-04-18 NOTE — ED Notes (Addendum)
Secondary RN attempted 2 additional times. Pt still not cooperating with staff even after reeducation. Pt will not keep her head still during placement attempts. Pt c/o of discomfort and there is scant blood in nares.

## 2023-04-18 NOTE — H&P (Signed)
[place]   PATIENT NAME: [name]    MR#:  [id number]  DATE OF BIRTH:  [birth year]  DATE OF ADMISSION:  04/18/2023  PRIMARY CARE PHYSICIAN: Emogene [name], MD   Patient is coming from: Home  REQUESTING/REFERRING PHYSICIAN: Pilar Jarvis, MD  CHIEF COMPLAINT:   Chief Complaint  Patient presents with   Abdominal Pain    HISTORY OF PRESENT ILLNESS:  Varie Olliff is a 77 y.o. 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  She has not had a bowel movement over the last 3 to 4 days.  She is passing flatus.  No dysuria, oliguria or hematuria or flank pain.  No cough or wheezing or hemoptysis.  No other bleeding diathesis.  No fever or chills.  No chest pain or palpitations.  ED Course: When she was in the ER, BP was 164/106 with otherwise normal vital signs.  Labs revealed BUN of 43 with a creatinine 1.08 and calcium of 8.6, albumin 3.4 with total bili 1.5.  High sensitive troponin I was 27 and later 28.  CBC showed leukocytosis of 13.1. EKG as reviewed by me : Showed sinus rhythm with rate of 87 with premature supraventricular complexes and T wave inversion laterally. Imaging: Noncontrasted CT scan showed no acute intracranial normality.  The mass in the right cingulate gyrus demonstrated on recent MRI was not visible on the study. CTA of the abdomen revealed the following: VASCULAR   1. No CT evidence of acute gastrointestinal hemorrhage. 2. No acute aortic abnormality. 3. Question splenic vein thrombosis. 4. Aortic Atherosclerosis (ICD10-I70.0).   NON-VASCULAR   1. High-grade small-bowel  obstruction with transition point within the right mid abdomen. Recommend surgical consultation. 2. Small volume simple free fluid ascites. 3. Uterine fibroids. 4. Infraumbilical ventral wall hernia repair.  The patient was given 500 mL IV normal saline bolus, 4 mg of IV Zofran and 80 mg of IV Protonix.  Dr. Maia Plan was notified about the patient.  She will be admitted to a medical bed for further evaluation and management.  PAST MEDICAL HISTORY:   Past Medical History:  Diagnosis Date   Abnormality of gait    Acute on chronic diastolic CHF (congestive heart failure) (HCC) 11/20/2017   Acute respiratory failure with hypoxia (HCC) 06/13/2022   AKI (acute kidney injury) (HCC) 03/03/2022   Breast cancer (HCC) 02/05/2005   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    Breast cancer (HCC) 2019   right breast   CHF (congestive heart failure) (HCC)    March 28, 2017 echo: EF 50-55%; 1) echo 07/2011: EF 20-25%, mild concentric hypertrophy, diffuse HK, regional WMA cannot be excluded, grade 1 DD, mitral valve with mild regurg, LA mildly dilated). 2) EF 40-45%, mild AS,                     Chronic kidney disease    Chronic kidney disease, stage 3b (HCC) 09/10/2022   Complication of anesthesia 2006   pt states "hard to wake up" after L breast surgery    Edema    GERD (gastroesophageal reflux disease)  Glucose intolerance 09/10/2022   Headache    Heart murmur    Hypertension    Hypertensive emergency 06/13/2022   Hypokinesis    global, EF 35-45 % from Echo.   Lower limb ulcer, calf, left, limited to breakdown of skin (HCC) 12/11/2020   Malignant neoplasm of breast (female), unspecified site 06/14/2018   RIGHT T1b, N0; ER/PR positive, HER-2 negative.   Neuropathy    Obesity    Pain in joint, site unspecified    Personal history of chemotherapy 2006   left breast   Personal history of radiation therapy 2019    right breast   Personal history of radiation therapy 2006   left breast   Pneumonia    Pyuria 08/22/2022   Sleep apnea    Syncope 03/03/2022   Tinea pedis    Unspecified hearing loss    bilateral   Weakness 03/03/2022    PAST SURGICAL HISTORY:   Past Surgical History:  Procedure Laterality Date   BREAST BIOPSY Right 2019   11 oc-INVASIVE MAMMARY CARCINOMA, NO SPECIAL TYPE   BREAST BIOPSY Right 2019   1030 oc- neg   BREAST LUMPECTOMY  2006   left breast    CATARACT EXTRACTION     left eye   HERNIA REPAIR  05/25/2006   Incarcerated omentum in ventral hernia, Ventralight mesh, combined lap/ open with omental resection.    IVC FILTER INSERTION N/A 03/30/2023   Procedure: IVC FILTER INSERTION;  Surgeon: Annice Needy, MD;  Location: [facility];  Service: Cardiovascular;  Laterality: N/A;   PARTIAL MASTECTOMY WITH NEEDLE LOCALIZATION Right 06/14/2018   Procedure: PARTIAL MASTECTOMY WITH NEEDLE LOCALIZATION;  Surgeon: Earline Mayotte, MD;  Location: [facility] OR[facility];  Service: General;  Laterality: Right;   PORT-A-CATH REMOVAL  05/25/2006   SENTINEL NODE BIOPSY Right 06/14/2018   Procedure: SENTINEL NODE BIOPSY;  Surgeon: Earline Mayotte, MD;  Location: [facility] ORS;  Service: General;  Laterality: Right;   TUBAL LIGATION      SOCIAL HISTORY:   Social History   Tobacco Use   Smoking status: Former    Current packs/day: 0.00    Average packs/day: 0.5 packs/day for 30.0 years (15.0 ttl pk-yrs)    Types: Cigarettes    Start date: 07/14/1970    Quit date: 07/14/2000    Years since quitting: 22.7   Smokeless tobacco: Never  Substance Use Topics   Alcohol use: No    FAMILY HISTORY:   Family History  Problem Relation Age of Onset   Hypertension Mother        deceased 19   Cataracts Mother    Breast cancer Other 66       maternal half-sister; deceased 80   Coronary artery disease Other     DRUG ALLERGIES:  No Known Allergies  REVIEW OF SYSTEMS:   ROS As per  history of present illness. All pertinent systems were reviewed above. Constitutional, HEENT, cardiovascular, respiratory, GI, GU, musculoskeletal, neuro, psychiatric, endocrine, integumentary and hematologic systems were reviewed and are otherwise negative/unremarkable except for positive findings mentioned above in the HPI.   MEDICATIONS AT HOME:   Prior to Admission medications   Medication Sig Start Date End Date Taking? Authorizing Provider  acetaminophen (TYLENOL) 325 MG tablet Take 2 tablets (650 mg total) by mouth every 6 (six) hours as needed for mild pain (or Fever >/= 101). 09/15/22   Gillis Santa, MD  anastrozole (ARIMIDEX) 1 MG tablet Take 1 tablet (1 mg total) by mouth  daily. 01/19/23   Creig Hines, MD  ascorbic acid (VITAMIN C) 500 MG tablet Take 1 tablet by mouth 2 (two) times daily.    [provider]  bisacodyl (DULCOLAX) 5 MG EC tablet Take 2 tablets (10 mg total) by mouth at bedtime. Skip the dose if no constipation Patient taking differently: Take 10 mg by mouth 2 (two) times daily. Skip the dose if no constipation 09/15/22   Gillis Santa, MD  Calcium Carbonate-Vitamin D 600-5 MG-MCG TABS Take 1 tablet by mouth 2 (two) times daily.    [provider]  dexamethasone (DECADRON) 4 MG tablet Take 1 tablet (4 mg total) by mouth 2 (two) times daily with a meal. 03/31/23   Arnetha Courser, MD  diclofenac Sodium (VOLTAREN) 1 % GEL Apply 2 g topically 2 (two) times daily as needed.    [provider]  Emollient (EUCERIN) lotion Apply topically 2 (two) times daily. Apply to dry skin especially bilateral lower extremities and arms. 09/15/22   Gillis Santa, MD  empagliflozin (JARDIANCE) 10 MG TABS tablet Take 1 tablet (10 mg total) by mouth daily before breakfast. 08/23/22   Marrion Coy, MD  ferrous sulfate 325 (65 FE) MG tablet Take 325 mg by mouth every other day. 12/16/22   [provider]  guaiFENesin (MUCINEX) 600 MG 12 hr tablet Take 600 mg by mouth  every 12 (twelve) hours.    [provider]  HYDROcodone-acetaminophen (NORCO/VICODIN) 5-325 MG tablet Take 1 tablet by mouth 3 (three) times daily as needed. 03/31/23   Arnetha Courser, MD  Incontinence Supply Disposable (BLADDER CONTROL PADS EX ABSORB) MISC Apply 1 Dose topically as needed.  03/22/18   [provider]  ipratropium-albuterol (DUONEB) 0.5-2.5 (3) MG/3ML SOLN Take 3 mLs by nebulization every 4 (four) hours as needed (wheezing). 12/04/22   [provider]  isosorbide mononitrate (IMDUR) 60 MG 24 hr tablet Take 1 tablet (60 mg total) by mouth 2 (two) times daily. 08/23/22   Marrion Coy, MD  levETIRAcetam (KEPPRA) 500 MG tablet Take 1 tablet (500 mg total) by mouth 2 (two) times daily. 03/31/23   Arnetha Courser, MD  loratadine (CLARITIN) 10 MG tablet Take 10 mg by mouth daily. 07/20/19   [provider]  losartan (COZAAR) 50 MG tablet Take 50 mg by mouth daily.    [provider]  omeprazole (PRILOSEC) 40 MG capsule Take 40 mg by mouth daily. 04/13/21   [provider]  oxybutynin (DITROPAN) 5 MG tablet Take 5 mg by mouth 2 (two) times daily.    [provider]  polyethylene glycol (MIRALAX / GLYCOLAX) 17 g packet Take 17 g by mouth daily. Skip the dose if no constipation 09/15/22   Gillis Santa, MD  pregabalin (LYRICA) 75 MG capsule Take 75 mg by mouth daily. 03/19/23   [provider]  sennosides-docusate sodium (SENOKOT-S) 8.6-50 MG tablet Take 1 tablet by mouth 2 (two) times daily.    [provider]  spironolactone (ALDACTONE) 25 MG tablet Take 12.5 mg by mouth daily.    [provider]  traMADol HCl 25 MG TABS Take 1 tablet by mouth every 6 (six) hours as needed. 03/31/23   Arnetha Courser, MD  vitamin B-12 (CYANOCOBALAMIN) 500 MCG tablet Take 500 mcg by mouth daily.    [provider]  Vitamin D, Ergocalciferol, (DRISDOL) 1.25 MG (50000 UNIT) CAPS capsule Take 50,000 Units by mouth once a week.  03/23/23   [provider]  Zinc Oxide (TRIPLE  PASTE) 12.8 % ointment Apply barrier cream to buttocks every shift.    [provider]      VITAL SIGNS:  Blood pressure (!) 160/99, pulse 88, temperature 98.2 F (36.8 C), temperature source Oral, resp. rate 18, height 5\' 9"  (1.753 m), weight 95.3 kg, SpO2 97%.  PHYSICAL EXAMINATION:  Physical Exam  GENERAL:  77 y.o.-year-old F collected female patient lying in the bed with no acute distress.  EYES: Pupils equal, round, reactive to light and accommodation. No scleral icterus. Extraocular muscles intact.  HEENT: Head atraumatic, normocephalic. Oropharynx and nasopharynx clear.  NECK:  Supple, no jugular venous distention. No thyroid enlargement, no tenderness.  LUNGS: Normal breath sounds bilaterally, no wheezing, rales,rhonchi or crepitation. No use of accessory muscles of respiration.  CARDIOVASCULAR: Regular rate and rhythm, S1, S2 normal. No murmurs, rubs, or gallops.  ABDOMEN: Soft, nondistended, with mild generalized tenderness without rebound tenderness guarding or rigidity.  She had significant diminished bowel sounds.  No organomegaly or mass.  EXTREMITIES: No pedal edema, cyanosis, or clubbing.  NEUROLOGIC: Cranial nerves II through XII are intact. Muscle strength 5/5 in all extremities. Sensation intact. Gait not checked.  PSYCHIATRIC: The patient is alert and oriented x 3.  Normal affect and good eye contact. SKIN: No obvious rash, lesion, or ulcer.   LABORATORY PANEL:   CBC Recent Labs  Lab 04/18/23 0519  WBC 13.3*  HGB 13.1  HCT 40.5  PLT 308   ------------------------------------------------------------------------------------------------------------------  Chemistries  Recent Labs  Lab 04/18/23 0038 04/18/23 0345  NA 139  --   K 4.1  --   CL 98  --   CO2 27  --   GLUCOSE 107*  --   BUN 43*  --   CREATININE 1.08* 1.05*  CALCIUM 8.6*  --   AST 11*  --   ALT 11  --   ALKPHOS 51  --    BILITOT 1.5*  --    ------------------------------------------------------------------------------------------------------------------  Cardiac Enzymes No results for input(s): "TROPONINI" in the last 168 hours. ------------------------------------------------------------------------------------------------------------------  RADIOLOGY:  CT ANGIO GI BLEED  Result Date: 04/18/2023 CLINICAL DATA:  hematemesis, abd pain; assess for GI bleed and obstruction/infection EXAM: CTA ABDOMEN AND PELVIS WITHOUT AND WITH CONTRAST TECHNIQUE: Multidetector CT imaging of the abdomen and pelvis was performed using the standard protocol during bolus administration of intravenous contrast. Multiplanar reconstructed images and MIPs were obtained and reviewed to evaluate the vascular anatomy. RADIATION DOSE REDUCTION: This exam was performed according to the departmental dose-optimization program which includes automated exposure control, adjustment of the mA and/or kV according to patient size and/or use of iterative reconstruction technique. CONTRAST:  OMNIPAQUE IOHEXOL 350 MG/ML SOLN COMPARISON:  CT abdomen pelvis 05/28/2022 FINDINGS: VASCULAR Aorta: Mild to moderate atherosclerotic plaque. Normal caliber aorta without aneurysm, dissection, vasculitis or significant stenosis. Celiac: At least mild atherosclerotic plaque patent without evidence of aneurysm, dissection, vasculitis or significant stenosis. SMA: Mild to moderate atherosclerotic plaque. Patent without evidence of aneurysm, dissection, vasculitis or significant stenosis. Renals: Mild atherosclerotic plaque. Both renal arteries are patent without evidence of aneurysm, dissection, vasculitis, fibromuscular dysplasia or significant stenosis. IMA: Moderate atherosclerotic plaque. Patent without evidence of aneurysm, dissection, vasculitis or significant stenosis. Inflow: Patent without evidence of aneurysm, dissection, vasculitis or significant stenosis.  Proximal Outflow: Bilateral common femoral and visualized portions of the superficial and profunda femoral arteries are patent without evidence of aneurysm, dissection, vasculitis or significant stenosis. Veins: The portal or superior mesenteric veins are patent. Splenic artery not visualized  and likely thrombosed. Inferior vena cava filter in appropriate position. Review of the MIP images confirms the above findings. NON-VASCULAR Lower chest: Partially visualized likely enlarged heart. Hepatobiliary: No focal liver abnormality. No gallstones, gallbladder wall thickening, or pericholecystic fluid. No biliary dilatation. Pancreas: No focal lesion. Normal pancreatic contour. No surrounding inflammatory changes. No main pancreatic ductal dilatation. Spleen: Normal in size without focal abnormality. Adrenals/Urinary Tract: No adrenal nodule bilaterally. Bilateral kidneys enhance symmetrically. No hydronephrosis. No hydroureter. Fluid density lesion within the right kidney likely represents a simple renal cyst. Simple renal cysts, in the absence of clinically indicated signs/symptoms, require no independent follow-up. The urinary bladder is decompressed and grossly unremarkable. Stomach/Bowel: Stomach is within normal limits. No evidence of large bowel wall thickening or dilatation. Multiple loops of small bowel within the mid abdomen dilated with fluid. No pneumatosis. Transition point noted within the right mid abdomen. Small bowel distally is collapsed. Appendix appears normal. Lymphatic: No lymphadenopathy. Reproductive: Lobulated uterine contour with calcified lesion suggestive of a uterine fibroid. No adnexal mass. Other: Small volume simple free fluid ascites. No intraperitoneal free gas. No organized fluid collection. Musculoskeletal: Infraumbilical ventral wall hernia repair with mesh. Tiny fat containing umbilical hernia superiorly. No suspicious lytic or blastic osseous lesions. No acute displaced fracture.  Grade 1 anterolisthesis of L4 on L5. Severe degenerative changes of the right hip. IMPRESSION: VASCULAR 1. No CT evidence of acute gastrointestinal hemorrhage. 2. No acute aortic abnormality. 3. Question splenic vein thrombosis. 4. Aortic Atherosclerosis (ICD10-I70.0). NON-VASCULAR 1. High-grade small-bowel obstruction with transition point within the right mid abdomen. Recommend surgical consultation. 2. Small volume simple free fluid ascites. 3. Uterine fibroids. 4. Infraumbilical ventral wall hernia repair. Electronically Signed   By: Tish Frederickson M.D.   On: 04/18/2023 02:17   CT Head Wo Contrast  Result Date: 04/18/2023 CLINICAL DATA:  Altered mental status EXAM: CT HEAD WITHOUT CONTRAST TECHNIQUE: Contiguous axial images were obtained from the base of the skull through the vertex without intravenous contrast. RADIATION DOSE REDUCTION: This exam was performed according to the departmental dose-optimization program which includes automated exposure control, adjustment of the mA and/or kV according to patient size and/or use of iterative reconstruction technique. COMPARISON:  Brain MRI 03/29/2023 FINDINGS: Brain: There is no hemorrhage or extra-axial collection. The size and configuration of the ventricles and extra-axial CSF spaces are normal. The brain parenchyma is normal, without acute or chronic infarction. The mass of the right cingulate gyrus demonstrated on the recent MRI is not visible on this study. Vascular: No abnormal hyperdensity of the major intracranial arteries or dural venous sinuses. No intracranial atherosclerosis. Skull: The visualized skull base, calvarium and extracranial soft tissues are normal. Sinuses/Orbits: No fluid levels or advanced mucosal thickening of the visualized paranasal sinuses. No mastoid or middle ear effusion. The orbits are normal. IMPRESSION: 1. No acute intracranial abnormality. 2. The mass of the right cingulate gyrus demonstrated on the recent MRI is not  visible on this study. Electronically Signed   By: Deatra Robinson M.D.   On: 04/18/2023 02:07      IMPRESSION AND PLAN:  Assessment and Plan: * SBO (small bowel obstruction) (HCC) - The patient be admitted to a medical bed. - She will be kept n.p.o. except for medications. - Pain management will be provided. - Follow-up two-view abdomen x-ray will be obtained later this morning. - She has had 4 unsuccessful attempted NG tube placement in the ER and has not been vomiting so far. - General Surgery consult will  be obtained. - Dr. Maia Plan is aware about the patient.  Coffee ground emesis - We will will place her for now on IV Protonix. - This could be related to her SBO. - Stool occult blood as well as Gastroccult were ordered. - Eliquis is temporarily being held off pending results. - GI consult is also being held off pending results.  Azotemia - This could be related to dehydration and possible GI bleeding. - We will follow BUN with hydration.  Chronic systolic CHF (congestive heart failure) (HCC) - We will continue her Aldactone, Jardiance and Cozaar.  Cancer of right breast metastatic to brain [facility]) - We will continue Arimidex and Keppra.  Leukocytosis - This could be related to stress demargination. - We will obtain a urinalysis.  Peripheral neuropathy - We will continue Lyrica.   DVT prophylaxis:  Eliquis is temporarily being held off pending stool Hemoccult and Gastroccult.  Given recent right tibial DVT we will holding off SCDs as well. Advanced Care Planning:  Code Status: full code. Family Communication:  The plan of care was discussed in details with the patient (and family). I answered all questions. The patient agreed to proceed with the above mentioned plan. Further management will depend upon hospital course. Disposition Plan: Back to previous home environment Consults called: General Surgery. All the records are reviewed and case discussed with ED  provider.  Status is: Inpatient  At the time of the admission, it appears that the appropriate admission status for this patient is inpatient.  This is judged to be reasonable and necessary in order to provide the required intensity of service to ensure the patient's safety given the presenting symptoms, physical exam findings and initial radiographic and laboratory data in the context of comorbid conditions.  The patient requires inpatient status due to high intensity of service, high risk of further deterioration and high frequency of surveillance required.  I certify that at the time of admission, it is my clinical judgment that the patient will require inpatient hospital care extending more than 2 midnights.                            Dispo: The patient is from: Home              Anticipated d/c is to: Home              Patient currently is not medically stable to d/c.              Difficult to place patient: No  Hannah Beat M.D on 04/18/2023 at 6:23 AM  [facility]   From 7 PM-7 AM, contact night-coverage [email or web address]  CC: Primary care physician; Emogene [name], MD

## 2023-04-18 NOTE — Progress Notes (Signed)
PHARMACIST - PHYSICIAN COMMUNICATION  CONCERNING:  Enoxaparin (Lovenox) for DVT Prophylaxis    RECOMMENDATION: Patient was prescribed enoxaprin 40mg  q24 hours for VTE prophylaxis.   Filed Weights   04/18/23 0031  Weight: 95.3 kg (210 lb)    Body mass index is 31.01 kg/m.  Estimated Creatinine Clearance: 53.6 mL/min (A) (by C-G formula based on SCr of 1.08 mg/dL (H)).   Based on [facility] policy patient is candidate for enoxaparin 0.5mg /kg TBW SQ every 24 hours based on BMI being >30.  DESCRIPTION: Pharmacy has adjusted enoxaparin dose per [facility] policy.  Patient is now receiving enoxaparin 0.5 mg/kg every 24 hours   Otelia Sergeant, PharmD, [facility] 04/18/2023 3:24 AM

## 2023-04-18 NOTE — TOC Initial Note (Signed)
Transition of Care [facility]) - Initial/Assessment Note    Patient Details  Name: [name] MRN: [id number] Date of Birth: [birth year]  Transition of Care [facility]) CM/SW Contact:    Allena Katz, LCSW Phone Number: 04/18/2023, 10:48 AM  Clinical Narrative:    [facility] with a SBO multiple attempts to place NGT overnight unsuccessful. Pt also having dark colored emesis for a week and has been unable to keep food down.  Pt has medical hx of  brain metastasis and is currently on chemotherapy  CSW following for care plan updates and needs. Pt will need new fl2 to return to [facility].                      Patient Goals and CMS Choice            Expected Discharge Plan and Services                                              Prior Living Arrangements/Services                       Activities of Daily Living Home Assistive Devices/Equipment: Wheelchair ADL Screening (condition at time of admission) Patient's cognitive ability adequate to safely complete daily activities?: Yes Is the patient deaf or have difficulty hearing?: No Does the patient have difficulty seeing, even when wearing glasses/contacts?: No Does the patient have difficulty concentrating, remembering, or making decisions?: No Patient able to express need for assistance with ADLs?: Yes Does the patient have difficulty dressing or bathing?: Yes Independently performs ADLs?: No Communication: Independent Dressing (OT): Needs assistance Is this a change from baseline?: Pre-admission baseline Grooming: Needs assistance Is this a change from baseline?: Pre-admission baseline Feeding: Independent Bathing: Needs assistance Is this a change from baseline?: Pre-admission baseline Toileting: Needs assistance Is this a change from baseline?: Pre-admission baseline In/Out Bed: Needs assistance Is this a change from baseline?: Pre-admission baseline Walks in Home:  Dependent Is this a change from baseline?: Pre-admission baseline Does the patient have difficulty walking or climbing stairs?: No Weakness of Legs: Both Weakness of Arms/Hands: None  Permission Sought/Granted                  Emotional Assessment              Admission diagnosis:  Small bowel obstruction (HCC) [K56.609] SBO (small bowel obstruction) (HCC) [K56.609] Coffee ground emesis [K92.0] Hematemesis with nausea [K92.0] Patient Active Problem List   Diagnosis Date Noted   SBO (small bowel obstruction) (HCC) 04/18/2023   Coffee ground emesis 04/18/2023   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/18/2023   Peripheral neuropathy 04/18/2023   Azotemia 04/18/2023   Leukocytosis 04/18/2023   Small bowel obstruction (HCC) 04/18/2023   Acute DVT of right tibial vein (HCC) 03/30/2023   Facial weakness 03/30/2023   DVT (deep venous thrombosis) (HCC) 03/30/2023   Mouth droop due to facial weakness 03/29/2023   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) 03/29/2023   Cellulitis of lower extremity 09/10/2022   Near syncope 08/22/2022   Constipation 08/22/2022   Stage 3a chronic kidney disease (HCC) 06/13/2022   Ulcers of both lower extremities, with unspecified severity (HCC) 03/03/2022   Class II obesity 03/03/2022   Anemia of chronic  disease 03/03/2022   PAD (peripheral artery disease) (HCC) 05/30/2020   Malignant neoplasm of right female breast (HCC) 05/13/2018   Venous insufficiency of both lower extremities 11/20/2017   Weight gain 11/20/2017   Lymphedema 04/19/2017   Benign essential HTN 04/19/2017   OSA (obstructive sleep apnea) 04/19/2017   Morbid obesity (HCC) 04/19/2017   TIA (transient ischemic attack) 03/28/2017   Urinary incontinence 06/27/2016   Chronic systolic CHF (congestive heart failure) (HCC) 04/20/2013   Breast cancer (HCC) 09/08/2004   PCP:  Emogene [name], MD Pharmacy:   [facility] [phone] - Closed - [place], [place] - [address] [place] [address] Phone: [phone] Fax: [phone]  [facility] - [place], [place] - [address] [place] [address] Phone: [phone] Fax: [phone]  [id number] - [place], [place] - [address] [place] [address] Phone: [phone] Fax: [phone]  [facility] [address]), [place] - [address] [place] ([address]) [place] [address] Phone: [phone] Fax: [phone]  [facility] [place], [place] - [address] [place] [address] Phone: [phone] Fax: [phone]     Social Determinants of Health (SDOH) Social History: SDOH Screenings   Food Insecurity: No Food Insecurity (04/18/2023)  Housing: Low Risk  (04/18/2023)  Transportation Needs: No Transportation Needs (04/18/2023)  Utilities: Not At Risk (04/18/2023)  Tobacco Use: Medium Risk (04/18/2023)   SDOH Interventions:     Readmission Risk Interventions    04/18/2023   10:48 AM 09/12/2022   11:09 AM  Readmission Risk Prevention Plan  Transportation Screening Complete   PCP or Specialist Appt within 3-5 Days  Complete  Social Work Consult for Recovery Care Planning/Counseling  Complete  Palliative Care Screening  Not Applicable  Medication Review [employer]) Complete   HRI or Home Care Consult Complete   Skilled Nursing Facility Complete

## 2023-04-18 NOTE — Progress Notes (Signed)
  Progress Note   Patient: [name] UJW:[id number] DOB: April 30, 1946 DOA: 04/18/2023     0 DOS: the patient was seen and examined on 04/18/2023   Brief hospital course: [name] is a 77 y.o. 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  CT abd/pelvis showed high-grade small-bowel obstruction with transition point within the right mid abdomen. Placed NPO, fluids, general surgery consult.    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 and fluids.  Continue symptomatic treatment.   Coffee ground emesis Secondary to nausea vomiting, continue Protonix.   Chronic kidney disease disease stage IIIa. Renal function still stable.   Chronic diastolic CHF (congestive heart failure) (HCC) Systolic congestive heart failure ruled out. Most recent echocardiogram showed ejection fraction 50 to 55%, no evidence of volume overload.   Cancer of right breast metastatic to brain [facility]) - We will continue Arimidex and Keppra.    Peripheral  neuropathy - We will continue Lyrica.      Subjective:  Patient does not have significant abdominal pain today.  Still nauseous, no vomiting.  Still has no bowel movement since admission.  Physical Exam: Vitals:   04/18/23 0330 04/18/23 0400 04/18/23 0440 04/18/23 0805  BP: (!) 161/89 (!) 153/104 (!) 160/99 (!) 155/89  Pulse: 94 89 88 89  Resp:      Temp: 98.2 F (36.8 C)  98.2 F (36.8 C) 98.4 F (36.9 C)  TempSrc: Axillary  Oral Oral  SpO2: 95% 92% 97% 98%  Weight:      Height:       General exam: Appears calm and comfortable  Respiratory system: Clear to auscultation. Respiratory effort normal. Cardiovascular system: S1 & S2 heard, RRR. No JVD, murmurs, rubs, gallops or clicks. No pedal edema. Gastrointestinal system: Abdomen is nondistended, soft and nontender. No organomegaly or masses felt. Normal bowel sounds heard. Central nervous system: Alert and oriented. No focal neurological deficits. Extremities: Symmetric 5 x 5 power. Skin: No rashes, lesions or ulcers Psychiatry: Judgement and insight appear normal. Mood & affect appropriate.    Data Reviewed:  Reviewed lab results, CT scan results.  Family Communication: None  Disposition: Status is: Inpatient Remains inpatient appropriate because: Severity of disease.     Time spent: No charge minutes  Author: Marrion [place], MD 04/18/2023 11:08 AM  For on call review [email or web address].

## 2023-04-18 NOTE — Assessment & Plan Note (Addendum)
Patient brought to the operating room on 8/12 by Dr. Hazle Quant.  Exploratory laparotomy and extensive enterolysis was done.  Patient started on full liquid diet on 8/17.

## 2023-04-18 NOTE — Consult Note (Signed)
SURGICAL CONSULTATION NOTE   HISTORY OF PRESENT ILLNESS (HPI):  77 y.o. female presented to [facility] ED for evaluation of nausea and vomiting for the last several days. Patient reports she has been feeling weak with associated nausea and vomiting for the last several days.  She described vomiting as coffee-ground.  She also endorses abdominal discomfort with some pain.  No specific location of the intermittent pain.  No pain radiation.  Patient cannot identify any alleviating or aggravating factors.  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  Patient was also found with DVT and is on oral anticoagulation.  Patient has previous abdominal surgery including umbilical hernia repair with mesh.  At the ED she was found with abdominal distention and tenderness on palpation.  No guarding or rebound tenderness.  There was no fever.  Stable vital signs.  She had a leukocytosis of 13.1.  She had a CT scan of the abdomen and pelvis that shows small bowel dilation with transition point in the right lower abdomen.  No free air.  I personally evaluated the images.  She had multiple attempts of NGT placement last night at the ED.  Surgery is consulted by Dr. Regino Schultze in this context for evaluation and management of small bowel obstruction.  PAST MEDICAL HISTORY (PMH):  Past Medical History:  Diagnosis Date   Abnormality of gait    Acute on chronic diastolic CHF (congestive heart failure) (HCC) 11/20/2017   Acute respiratory failure with hypoxia (HCC) 06/13/2022   AKI (acute kidney injury) (HCC) 03/03/2022   Breast cancer (HCC) 02/05/2005   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    Breast cancer (HCC) 2019   right breast   CHF (congestive heart failure) (HCC)    March 28, 2017 echo: EF 50-55%; 1) echo 07/2011: EF 20-25%, mild concentric hypertrophy,  diffuse HK, regional WMA cannot be excluded, grade 1 DD, mitral valve with mild regurg, LA mildly dilated). 2) EF 40-45%, mild AS,                     Chronic kidney disease    Chronic kidney disease, stage 3b (HCC) 09/10/2022   Complication of anesthesia 2006   pt states "hard to wake up" after L breast surgery    Edema    GERD (gastroesophageal reflux disease)    Glucose intolerance 09/10/2022   Headache    Heart murmur    Hypertension    Hypertensive emergency 06/13/2022   Hypokinesis    global, EF 35-45 % from Echo.   Lower limb ulcer, calf, left, limited to breakdown of skin (HCC) 12/11/2020   Malignant neoplasm of breast (female), unspecified site 06/14/2018   RIGHT T1b, N0; ER/PR positive, HER-2 negative.   Neuropathy    Obesity    Pain in joint, site unspecified    Personal history of chemotherapy 2006   left breast   Personal history of radiation therapy 2019   right breast   Personal history of radiation therapy 2006   left breast   Pneumonia    Pyuria 08/22/2022   Sleep apnea    Syncope 03/03/2022   Tinea pedis    Unspecified hearing loss    bilateral   Weakness 03/03/2022     PAST SURGICAL HISTORY [facility]):  Past Surgical History:  Procedure Laterality Date   BREAST BIOPSY Right 2019   11  oc-INVASIVE MAMMARY CARCINOMA, NO SPECIAL TYPE   BREAST BIOPSY Right 2019   1030 oc- neg   BREAST LUMPECTOMY  2006   left breast    CATARACT EXTRACTION     left eye   HERNIA REPAIR  05/25/2006   Incarcerated omentum in ventral hernia, Ventralight mesh, combined lap/ open with omental resection.    IVC FILTER INSERTION N/A 03/30/2023   Procedure: IVC FILTER INSERTION;  Surgeon: Annice Needy, MD;  Location: [facility] INVASIVE CV LAB;  Service: Cardiovascular;  Laterality: N/A;   PARTIAL MASTECTOMY WITH NEEDLE LOCALIZATION Right 06/14/2018   Procedure: PARTIAL MASTECTOMY WITH NEEDLE LOCALIZATION;  Surgeon: Earline Mayotte, MD;  Location: [facility] ORS;  Service: General;  Laterality:  Right;   PORT-A-CATH REMOVAL  05/25/2006   SENTINEL NODE BIOPSY Right 06/14/2018   Procedure: SENTINEL NODE BIOPSY;  Surgeon: Earline Mayotte, MD;  Location: [facility] ORS;  Service: General;  Laterality: Right;   TUBAL LIGATION       MEDICATIONS:  Prior to Admission medications   Medication Sig Start Date End Date Taking? Authorizing Provider  acetaminophen (TYLENOL) 325 MG tablet Take 2 tablets (650 mg total) by mouth every 6 (six) hours as needed for mild pain (or Fever >/= 101). 09/15/22  Yes Gillis Santa, MD  anastrozole (ARIMIDEX) 1 MG tablet Take 1 tablet (1 mg total) by mouth daily. 01/19/23  Yes Creig Hines, MD  ascorbic acid (VITAMIN C) 500 MG tablet Take 1 tablet by mouth 2 (two) times daily.   Yes [provider]  bisacodyl (DULCOLAX) 5 MG EC tablet Take 2 tablets (10 mg total) by mouth at bedtime. Skip the dose if no constipation Patient taking differently: Take 10 mg by mouth 2 (two) times daily. Skip the dose if no constipation 09/15/22  Yes Gillis Santa, MD  Calcium Carbonate-Vitamin D 600-5 MG-MCG TABS Take 1 tablet by mouth 2 (two) times daily.   Yes [provider]  dexamethasone (DECADRON) 4 MG tablet Take 1 tablet (4 mg total) by mouth 2 (two) times daily with a meal. 03/31/23  Yes Arnetha Courser, MD  diclofenac Sodium (VOLTAREN) 1 % GEL Apply 2 g topically 2 (two) times daily as needed.   Yes [provider]  ELIQUIS 5 MG TABS tablet Take 5 mg by mouth 2 (two) times daily. 04/08/23  Yes [provider]  Emollient (EUCERIN) lotion Apply topically 2 (two) times daily. Apply to dry skin especially bilateral lower extremities and arms. 09/15/22  Yes Gillis Santa, MD  empagliflozin (JARDIANCE) 10 MG TABS tablet Take 1 tablet (10 mg total) by mouth daily before breakfast. 08/23/22  Yes Marrion Coy, MD  ferrous sulfate 325 (65 FE) MG tablet Take 325 mg by mouth every other day. 12/16/22  Yes [provider]  guaiFENesin (MUCINEX) 600 MG 12 hr  tablet Take 600 mg by mouth every 12 (twelve) hours.   Yes [provider]  HYDROcodone-acetaminophen (NORCO/VICODIN) 5-325 MG tablet Take 1 tablet by mouth 3 (three) times daily as needed. 03/31/23  Yes Arnetha Courser, MD  ipratropium-albuterol (DUONEB) 0.5-2.5 (3) MG/3ML SOLN Take 3 mLs by nebulization every 4 (four) hours as needed (wheezing). 12/04/22  Yes [provider]  isosorbide mononitrate (IMDUR) 60 MG 24 hr tablet Take 1 tablet (60 mg total) by mouth 2 (two) times daily. 08/23/22  Yes Marrion Coy, MD  loratadine (CLARITIN) 10 MG tablet Take 10 mg by mouth daily. 07/20/19  Yes [provider]  losartan (COZAAR) 50 MG tablet  Take 50 mg by mouth daily.   Yes [provider]  omeprazole (PRILOSEC) 40 MG capsule Take 40 mg by mouth daily. 04/13/21  Yes [provider]  ondansetron (ZOFRAN-ODT) 4 MG disintegrating tablet Take 4 mg by mouth every 8 (eight) hours as needed. 04/13/23  Yes [provider]  oxybutynin (DITROPAN) 5 MG tablet Take 5 mg by mouth 2 (two) times daily.   Yes [provider]  polyethylene glycol (MIRALAX / GLYCOLAX) 17 g packet Take 17 g by mouth daily. Skip the dose if no constipation 09/15/22  Yes Gillis Santa, MD  pregabalin (LYRICA) 75 MG capsule Take 75 mg by mouth daily. 03/19/23  Yes [provider]  sennosides-docusate sodium (SENOKOT-S) 8.6-50 MG tablet Take 1 tablet by mouth 2 (two) times daily.   Yes [provider]  traMADol HCl 25 MG TABS Take 1 tablet by mouth every 6 (six) hours as needed. 03/31/23  Yes Arnetha Courser, MD  vitamin B-12 (CYANOCOBALAMIN) 500 MCG tablet Take 500 mcg by mouth daily.   Yes [provider]  Incontinence Supply Disposable (BLADDER CONTROL PADS EX ABSORB) MISC Apply 1 Dose topically as needed.  03/22/18   [provider]  levETIRAcetam (KEPPRA) 500 MG tablet Take 1 tablet (500 mg total) by mouth 2 (two) times daily. Patient not taking: Reported  on 04/18/2023 03/31/23   Arnetha Courser, MD  spironolactone (ALDACTONE) 25 MG tablet Take 12.5 mg by mouth daily. Patient not taking: Reported on 04/18/2023    [provider]  Vitamin D, Ergocalciferol, (DRISDOL) 1.25 MG (50000 UNIT) CAPS capsule Take 50,000 Units by mouth once a week. 03/23/23   [provider]  Zinc Oxide (TRIPLE PASTE) 12.8 % ointment Apply barrier cream to buttocks every shift.    [provider]     ALLERGIES:  No Known Allergies   SOCIAL HISTORY:  Social History   Socioeconomic History   Marital status: Divorced    Spouse name: Not on file   Number of children: Not on file   Years of education: Not on file   Highest education level: Not on file  Occupational History   Occupation: retired  Tobacco Use   Smoking status: Former    Current packs/day: 0.00    Average packs/day: 0.5 packs/day for 30.0 years (15.0 ttl pk-yrs)    Types: Cigarettes    Start date: 07/14/1970    Quit date: 07/14/2000    Years since quitting: 22.7   Smokeless tobacco: Never  Vaping Use   Vaping status: Never Used  Substance and Sexual Activity   Alcohol use: No   Drug use: Not Currently    Types: Marijuana    Comment: endorses prior marijuana use 1999   Sexual activity: Never  Other Topics Concern   Not on file  Social History Narrative   Not on file   Social Determinants of Health   Financial Resource Strain: Not on file  Food Insecurity: No Food Insecurity (04/18/2023)   Hunger Vital Sign    Worried About Running Out of Food in the Last Year: Never true    Ran Out of Food in the Last Year: Never true  Transportation Needs: No Transportation Needs (04/18/2023)   PRAPARE - Administrator, Civil Service (Medical): No    Lack of Transportation (Non-Medical): No  Physical Activity: Not on file  Stress: Not on file  Social Connections: Not on file  Intimate Partner Violence: Not At Risk (04/18/2023)   Humiliation,  Afraid, Rape, and Kick  questionnaire    Fear of Current or Ex-Partner: No    Emotionally Abused: No    Physically Abused: No    Sexually Abused: No      FAMILY HISTORY:  Family History  Problem Relation Age of Onset   Hypertension Mother        deceased 75   Cataracts Mother    Breast cancer Other 52       maternal half-sister; deceased 49   Coronary artery disease Other      REVIEW OF SYSTEMS:  Constitutional: denies weight loss, fever, chills, or sweats  Eyes: denies any other vision changes, history of eye injury  ENT: denies sore throat, hearing problems  Respiratory: denies shortness of breath, wheezing  Cardiovascular: denies chest pain, palpitations  Gastrointestinal: Positive abdominal pain, nausea and vomiting Genitourinary: denies burning with urination or urinary frequency Musculoskeletal: denies any other joint pains or cramps  Skin: denies any other rashes or skin discolorations  Neurological: Positive headache, dizziness, weakness  Psychiatric: denies any other depression, anxiety   All other review of systems were negative   VITAL SIGNS:  Temp:  [98.2 F (36.8 C)-98.4 F (36.9 C)] 98.4 F (36.9 C) (08/10 0805) Pulse Rate:  [86-96] 89 (08/10 0805) Resp:  [18-24] 18 (08/10 0300) BP: (153-173)/(85-120) 155/89 (08/10 0805) SpO2:  [91 %-98 %] 98 % (08/10 0805) Weight:  [95.3 kg] 95.3 kg (08/10 0031)     Height: 5\' 9"  (175.3 cm) Weight: 95.3 kg BMI (Calculated): 31   INTAKE/OUTPUT:  This shift: No intake/output data recorded.  Last 2 shifts: @IOLAST2SHIFTS @   PHYSICAL EXAM:  Constitutional:  -- Normal body habitus  -- Awake, alert, and oriented x3  Eyes:  -- Pupils equally round and reactive to light  -- No scleral icterus  Ear, nose, and throat:  -- No jugular venous distension  Pulmonary:  -- No crackles  -- Equal breath sounds bilaterally -- Breathing non-labored at rest Cardiovascular:  -- S1, S2 present  -- No pericardial rubs Gastrointestinal:  -- Abdomen  soft, mild tender, distended, no guarding or rebound tenderness -- No abdominal masses appreciated, pulsatile or otherwise  Musculoskeletal and Integumentary:  -- Wounds: None appreciated -- Extremities: B/L UE and LE FROM, hands and feet warm, no edema  Neurologic:  -- Motor function: intact and symmetric -- Sensation: intact and symmetric   Labs:     Latest Ref Rng & Units 04/18/2023    5:19 AM 04/18/2023    3:45 AM 04/18/2023   12:38 AM  CBC  WBC 4.0 - 10.5 K/uL 13.3  14.2  13.1   Hemoglobin 12.0 - 15.0 g/dL 16.1  09.6  04.5   Hematocrit 36.0 - 46.0 % 40.5  42.6  44.5   Platelets 150 - 400 K/uL 308  334  310       Latest Ref Rng & Units 04/18/2023    3:45 AM 04/18/2023   12:38 AM 03/30/2023    5:42 AM  CMP  Glucose 70 - 99 mg/dL  409  811   BUN 8 - 23 mg/dL  43  27   Creatinine 9.14 - 1.00 mg/dL 7.82  9.56  2.13   Sodium 135 - 145 mmol/L  139  140   Potassium 3.5 - 5.1 mmol/L  4.1  4.2   Chloride 98 - 111 mmol/L  98  103   CO2 22 - 32 mmol/L  27  29   Calcium 8.9 - 10.3 mg/dL  8.6  8.8   Total Protein 6.5 - 8.1 g/dL  7.4  7.3   Total Bilirubin 0.3 - 1.2 mg/dL  1.5  0.5   Alkaline Phos 38 - 126 U/L  51  54   AST 15 - 41 U/L  11  13   ALT 0 - 44 U/L  11  12      Imaging studies:  CLINICAL DATA:  hematemesis, abd pain; assess for GI bleed and obstruction/infection   EXAM: CTA ABDOMEN AND PELVIS WITHOUT AND WITH CONTRAST   TECHNIQUE: Multidetector CT imaging of the abdomen and pelvis was performed using the standard protocol during bolus administration of intravenous contrast. Multiplanar reconstructed images and MIPs were obtained and reviewed to evaluate the vascular anatomy.   RADIATION DOSE REDUCTION: This exam was performed according to the departmental dose-optimization program which includes automated exposure control, adjustment of the mA and/or kV according to patient size and/or use of iterative reconstruction technique.   CONTRAST:  OMNIPAQUE  IOHEXOL 350 MG/ML SOLN   COMPARISON:  CT abdomen pelvis 05/28/2022   FINDINGS: VASCULAR   Aorta: Mild to moderate atherosclerotic plaque. Normal caliber aorta without aneurysm, dissection, vasculitis or significant stenosis.   Celiac: At least mild atherosclerotic plaque patent without evidence of aneurysm, dissection, vasculitis or significant stenosis.   SMA: Mild to moderate atherosclerotic plaque. Patent without evidence of aneurysm, dissection, vasculitis or significant stenosis.   Renals: Mild atherosclerotic plaque. Both renal arteries are patent without evidence of aneurysm, dissection, vasculitis, fibromuscular dysplasia or significant stenosis.   IMA: Moderate atherosclerotic plaque. Patent without evidence of aneurysm, dissection, vasculitis or significant stenosis.   Inflow: Patent without evidence of aneurysm, dissection, vasculitis or significant stenosis.   Proximal Outflow: Bilateral common femoral and visualized portions of the superficial and profunda femoral arteries are patent without evidence of aneurysm, dissection, vasculitis or significant stenosis.   Veins: The portal or superior mesenteric veins are patent. Splenic artery not visualized and likely thrombosed. Inferior vena cava filter in appropriate position.   Review of the MIP images confirms the above findings.   NON-VASCULAR   Lower chest: Partially visualized likely enlarged heart.   Hepatobiliary: No focal liver abnormality. No gallstones, gallbladder wall thickening, or pericholecystic fluid. No biliary dilatation.   Pancreas: No focal lesion. Normal pancreatic contour. No surrounding inflammatory changes. No main pancreatic ductal dilatation.   Spleen: Normal in size without focal abnormality.   Adrenals/Urinary Tract:   No adrenal nodule bilaterally.   Bilateral kidneys enhance symmetrically.   No hydronephrosis. No hydroureter. Fluid density lesion within the right kidney  likely represents a simple renal cyst. Simple renal cysts, in the absence of clinically indicated signs/symptoms, require no independent follow-up.   The urinary bladder is decompressed and grossly unremarkable.   Stomach/Bowel: Stomach is within normal limits. No evidence of large bowel wall thickening or dilatation. Multiple loops of small bowel within the mid abdomen dilated with fluid. No pneumatosis. Transition point noted within the right mid abdomen. Small bowel distally is collapsed. Appendix appears normal.   Lymphatic: No lymphadenopathy.   Reproductive: Lobulated uterine contour with calcified lesion suggestive of a uterine fibroid. No adnexal mass.   Other: Small volume simple free fluid ascites. No intraperitoneal free gas. No organized fluid collection.   Musculoskeletal:   Infraumbilical ventral wall hernia repair with mesh. Tiny fat containing umbilical hernia superiorly.   No suspicious lytic or blastic osseous lesions. No acute displaced fracture. Grade 1 anterolisthesis of L4 on L5. Severe degenerative  changes of the right hip.   IMPRESSION: VASCULAR   1. No CT evidence of acute gastrointestinal hemorrhage. 2. No acute aortic abnormality. 3. Question splenic vein thrombosis. 4. Aortic Atherosclerosis (ICD10-I70.0).   NON-VASCULAR   1. High-grade small-bowel obstruction with transition point within the right mid abdomen. Recommend surgical consultation. 2. Small volume simple free fluid ascites. 3. Uterine fibroids. 4. Infraumbilical ventral wall hernia repair.     Electronically Signed   By: Tish Frederickson M.D.   On: 04/18/2023 02:17  Assessment/Plan:  77 y.o. female with small bowel obstruction, complicated by pertinent comorbidities including chronic kidney disease, DVT on anticoagulation, metastatic breast cancer to brain CHF.  Patient with physical exam, history and CT scan consistent with small bowel obstruction.  Patient has previous  umbilical hernia repair.  Differential diagnosis is bowel obstruction due to adhesions.  Other differential diagnosis could be due to metastatic disease.  I had a long discussion with the patient and her daughter on the phone about the importance of trial of conservative management with NGT placement.  Patient is very high risk for surgery due to medical comorbidities.  The daughter will have another conversation with the patient to give another trial for NGT placement.  They do understand that if NGT cannot be placed or if she does not respond to conservative management she will need surgical intervention.  Continue NPO.  Continue IV hydration.  Continue pain management.  Gae Gallop, MD

## 2023-04-18 NOTE — Plan of Care (Signed)
  Problem: Education: Goal: Knowledge of General Education information will improve Description: Including pain rating scale, medication(s)/side effects and non-pharmacologic comfort measures Outcome: Progressing   Problem: Clinical Measurements: Goal: Ability to maintain clinical measurements within normal limits will improve Outcome: Progressing   Problem: Activity: Goal: Risk for activity intolerance will decrease Outcome: Progressing   Problem: Nutrition: Goal: Adequate nutrition will be maintained Outcome: Progressing   Problem: Elimination: Goal: Will not experience complications related to bowel motility Outcome: Progressing   Problem: Pain Managment: Goal: General experience of comfort will improve Outcome: Progressing   Problem: Safety: Goal: Ability to remain free from injury will improve Outcome: Progressing   Problem: Skin Integrity: Goal: Risk for impaired skin integrity will decrease Outcome: Progressing   

## 2023-04-18 NOTE — Progress Notes (Signed)
Patient arrived to floor alert and orientated x 4 with C/O abdominal pain 9/10. No complaint of nausea or vomiting.NG Tube placement continue pending as ED nurses had attempted x 4 per Morrie Sheldon (ER,Nurse) to Insert. Dr. Arville Care aware of NGT situation. IV intact to left AC. Call bell given and patient oriented to room. Provider message for analgesic.

## 2023-04-18 NOTE — Procedures (Signed)
Procedures  Insertion of nasogastric tube   After discussion with the daughter patient agreed to proceed with nasogastric tube placement.  16 French nasogastric tube was inserted through the left nostril.  This was advanced up to 65 cm.  Adequate bilious output was obtained.  Minimal bleeding to the nostril.  No sign of complications.  Will order x-ray for position verification.

## 2023-04-18 NOTE — ED Triage Notes (Signed)
Pt to ED via [facility] from [facility] c/o epigastric pain and dark colored vomit. Pt says this has been going on for about a week. Pt takes eliquis. Last BM was 4 days ago. Denies CP, SOB, fevers, dizziness. l

## 2023-04-18 NOTE — Group Note (Deleted)
Date:  04/18/2023 Time:  2:12 AM  Group Topic/Focus:  Wrap-Up Group:   The focus of this group is to help patients review their daily goal of treatment and discuss progress on daily workbooks.     Participation Level:  {BHH PARTICIPATION ZOXWR:60454}  Participation Quality:  {BHH PARTICIPATION QUALITY:22265}  Affect:  {BHH AFFECT:22266}  Cognitive:  {BHH COGNITIVE:22267}  Insight: {BHH Insight2:20797}  Engagement in Group:  {BHH ENGAGEMENT IN UJWJX:91478}  Modes of Intervention:  {BHH MODES OF INTERVENTION:22269}  Additional Comments:  ***  Maglione,Radin Raptis E 04/18/2023, 2:12 AM

## 2023-04-18 NOTE — Assessment & Plan Note (Addendum)
Continue Arimidex and Keppra.

## 2023-04-18 NOTE — ED Notes (Addendum)
Rn attempted to place ng tube x 2. Pt not following directions, moving head all around, and tube keeps getting kinked. Pt reeducated on the reason and importance of ng. Second RN at bedside to attempt.

## 2023-04-18 NOTE — Hospital Course (Addendum)
[name] is a 77 y.o. 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  CT abd/pelvis showed high-grade small-bowel obstruction with transition point within the right mid abdomen. 8/12.  Patient taken to the operating room for small bowel obstruction and had exploratory laparotomy and extensive enterolysis.  Patient had a biopsy of the mesenteric lymph node. 8/14.  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  Patient has not passed gas yet.  Still with NG tube and n.p.o. 8/15.  Patient still has not passed gas or had a bowel movement.  Still with NG tube and n.p.o. 8/16.  Patient had bowel movements after Gastrografin put down NG tube.  Today NG tube was removed and started on clear liquid diet. 8/17.  Patient started on full liquid diet.  Added boost and Ensure. 8/18.  Patient advance to solid food.  Notified by nursing staff that she vomited 1 time.  No further vomiting but did not eat very well today. 8/19.  Continue advance diet.  Patient had a bowel movement yesterday.  Watching intake today and potential discharge tomorrow. 8/20.  Patient cleared by general surgery for discharge and follow up as outpatient.

## 2023-04-18 NOTE — ED Provider Notes (Signed)
[facility] Provider Note    Event Date/Time   First MD Initiated Contact with Patient 04/18/23 [phone]     (approximate)   History   Abdominal Pain   HPI  [name] is a 77 y.o. 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  No bowel movement for the last 3 to 4 days.  She is passing flatus.  She denies urinary symptoms.  Denies fever or chills.  She denies headache.  Restart her anticoagulation during her hospitalization in July 2024 where was discovered she had metastatic cancer to the brain and an IVC filter placed for DVT and determined to be suitable for anticoagulation.   External Medical Documents Reviewed: Discharge summary from July 2024 documenting brain mass, anticoagulation status.      Physical Exam   Triage Vital Signs: ED Triage Vitals  Encounter Vitals Group     BP 04/18/23 0031 (!) 164/106     Systolic BP Percentile --      Diastolic BP Percentile --      Pulse Rate 04/18/23 0031 96     Resp 04/18/23 0031 20     Temp 04/18/23 0031 98.3 F (36.8 C)     Temp Source 04/18/23 0031 Oral     SpO2 04/18/23 0031 93 %     Weight 04/18/23 0031 210 lb (95.3 kg)     Height 04/18/23 0031 5\' 9"  (1.753 m)     Head Circumference --      Peak Flow --      Pain Score 04/18/23 0030 10     Pain Loc --      Pain Education --      Exclude from Growth Chart --     Most recent vital signs: Vitals:   04/18/23 0115 04/18/23 0200  BP: (!) 157/120 (!) 173/105  Pulse: 86 88  Resp: (!) 21 (!) 24  Temp:    SpO2: 96% 93%    General: Awake, no distress.  CV:  Good peripheral perfusion.  Resp:  Normal effort.  Abd:  No distention.  Other:  Use abdominal tenderness to palpation.  Rectal exam shows yellow  stool, no blood or melena.  She has an emesis bag with some scant emesis with flecks of coffee-ground emesis.  Her vital signs are normal, hypertensive, no fever or tachycardia.   ED Results / Procedures / Treatments   Labs (all labs ordered are listed, but only abnormal results are displayed) Labs Reviewed  COMPREHENSIVE METABOLIC PANEL - Abnormal; Notable for the following components:      Result Value   Glucose, Bld 107 (*)    BUN 43 (*)    Creatinine, Ser 1.08 (*)    Calcium 8.6 (*)    Albumin 3.4 (*)    AST 11 (*)    Total Bilirubin 1.5 (*)    GFR, Estimated 53 (*)    All other components within normal limits  CBC - Abnormal; Notable for the following components:   WBC 13.1 (*)    RBC 5.18 (*)    RDW 16.3 (*)    All other components within normal limits  TROPONIN I (HIGH SENSITIVITY) - Abnormal; Notable for the following components:   Troponin I (High Sensitivity) 27 (*)    All other  components within normal limits  LIPASE, BLOOD  URINALYSIS, ROUTINE W REFLEX MICROSCOPIC  TYPE AND SCREEN  TYPE AND SCREEN  TROPONIN I (HIGH SENSITIVITY)     I ordered and reviewed the above labs they are notable for her H&H are stable from prior, increased at 13 compared to 11.  White blood cell count slightly elevated 13  EKG  ED ECG REPORT I, Pilar Jarvis, the attending physician, personally viewed and interpreted this ECG.   Date: 04/18/2023  EKG Time: 0037  Rate: 87  Rhythm: sinus  Axis: nl  Intervals:  none  ST&T Change: isolated ste lead v2 without wo reciprocal changes and wo meeting stemi criteria     RADIOLOGY I independently reviewed and interpreted CT scan of the head see no obvious bleeding or midline shift I also reviewed radiologist's formal read.   PROCEDURES:  Critical Care performed: No  Procedures   MEDICATIONS ORDERED IN ED: Medications  ondansetron (ZOFRAN) injection 4 mg (4 mg Intravenous Given 04/18/23 0105)  pantoprazole (PROTONIX) 80 mg /NS 100  mL IVPB (0 mg Intravenous Stopped 04/18/23 0217)  sodium chloride 0.9 % bolus 500 mL (500 mLs Intravenous New Bag/Given 04/18/23 0104)  iohexol (OMNIPAQUE) 350 MG/ML injection 100 mL (100 mLs Intravenous Contrast Given 04/18/23 0139)    External physician / consultants:  I spoke with hospitalist and general surgeon Dr. Maia Plan regarding care plan for this patient.   IMPRESSION / MDM / ASSESSMENT AND PLAN / ED COURSE  I reviewed the triage vital signs and the nursing notes.                                Patient's presentation is most consistent with acute presentation with potential threat to life or bodily function.  Differential diagnosis includes, but is not limited to, upper GI bleeding, acute blood loss anemia, intra-abdominal infection, obstruction, worsening brain edema, ICH   The patient is on the cardiac monitor to evaluate for evidence of arrhythmia and/or significant heart rate changes.  MDM:    Patient with new onset upper GI bleeding nausea and vomiting with coffee-ground emesis.  No further significant emesis in the emergency department after antiemetic given.  Protonix given as well.  Small fluid bolus given.  Hemodynamics appropriate and reassuring thus far.  Abdomen is tender, concern for obstruction given no BMs and vomiting, CT of the abdomen pelvis GI bleeding protocol ordered.  Also concern for worsening edema in her known brain mass, bleeding, given her nausea and vomiting, will obtain CT scan of the head.  Her high risk status new anticoagulation with new GI upper GI bleeding will be admitted for monitoring even if all workup above is negative for emergent pathologies.  -- Patient with no further episodes of emesis.  Remains hemodynamically stable.  CT head negative for bleeding.  CT abdomen pelvis shows no active bleeding but does show a small bowel obstruction.  Consulted with general surgeon Dr. Maia Plan who recommends NG tube and hospitalist admission.       FINAL CLINICAL IMPRESSION(S) / ED DIAGNOSES   Final diagnoses:  Coffee ground emesis  Hematemesis with nausea  Small bowel obstruction (HCC)     Rx / DC Orders   ED Discharge Orders     None        Note:  This document was prepared using Dragon voice recognition software and may include unintentional dictation errors.    Pilar Jarvis, MD  04/18/23 0243  

## 2023-04-18 NOTE — Assessment & Plan Note (Signed)
Continue Lyrica. 

## 2023-04-18 NOTE — Assessment & Plan Note (Addendum)
-   We will continue her Aldactone, Jardiance and Cozaar.

## 2023-04-18 NOTE — Assessment & Plan Note (Deleted)
-   This could be related to dehydration and possible GI bleeding. - We will follow BUN with hydration.

## 2023-04-19 ENCOUNTER — Inpatient Hospital Stay: Payer: Medicare Other

## 2023-04-19 DIAGNOSIS — E162 Hypoglycemia, unspecified: Secondary | ICD-10-CM | POA: Diagnosis not present

## 2023-04-19 DIAGNOSIS — K56609 Unspecified intestinal obstruction, unspecified as to partial versus complete obstruction: Secondary | ICD-10-CM

## 2023-04-19 DIAGNOSIS — J690 Pneumonitis due to inhalation of food and vomit: Secondary | ICD-10-CM | POA: Insufficient documentation

## 2023-04-19 DIAGNOSIS — C7931 Secondary malignant neoplasm of brain: Secondary | ICD-10-CM

## 2023-04-19 DIAGNOSIS — C50911 Malignant neoplasm of unspecified site of right female breast: Secondary | ICD-10-CM

## 2023-04-19 DIAGNOSIS — R509 Fever, unspecified: Secondary | ICD-10-CM | POA: Insufficient documentation

## 2023-04-19 LAB — URINALYSIS, ROUTINE W REFLEX MICROSCOPIC
Bacteria, UA: NONE SEEN
Bilirubin Urine: NEGATIVE
Glucose, UA: >=500 mg/dL — AB
Hgb urine dipstick: NEGATIVE
Ketones, ur: 5 mg/dL — AB
Nitrite: NEGATIVE
Protein, ur: 30 mg/dL — AB
Specific Gravity, Urine: 1.033 — ABNORMAL HIGH (ref 1.005–1.030)
pH: 5 (ref 5.0–8.0)

## 2023-04-19 LAB — BASIC METABOLIC PANEL WITH GFR
Anion gap: 10 (ref 5–15)
BUN: 41 mg/dL — ABNORMAL HIGH (ref 8–23)
CO2: 26 mmol/L (ref 22–32)
Calcium: 8.1 mg/dL — ABNORMAL LOW (ref 8.9–10.3)
Chloride: 107 mmol/L (ref 98–111)
Creatinine, Ser: 0.99 mg/dL (ref 0.44–1.00)
GFR, Estimated: 59 mL/min — ABNORMAL LOW (ref >60)
Glucose, Bld: 59 mg/dL — ABNORMAL LOW (ref 70–99)
Potassium: 3.8 mmol/L (ref 3.5–5.1)
Sodium: 143 mmol/L (ref 135–145)

## 2023-04-19 LAB — GLUCOSE, CAPILLARY
Glucose-Capillary: 46 mg/dL — ABNORMAL LOW (ref 70–99)
Glucose-Capillary: 167 mg/dL — ABNORMAL HIGH (ref 70–99)
Glucose-Capillary: 128 mg/dL — ABNORMAL HIGH (ref 70–99)
Glucose-Capillary: 103 mg/dL — ABNORMAL HIGH (ref 70–99)

## 2023-04-19 LAB — CBC
HCT: 40.1 % (ref 36.0–46.0)
Hemoglobin: 12.4 g/dL (ref 12.0–15.0)
MCH: 27 pg (ref 26.0–34.0)
MCHC: 30.9 g/dL (ref 30.0–36.0)
MCV: 87.2 fL (ref 80.0–100.0)
Platelets: 275 10*3/uL (ref 150–400)
RBC: 4.6 MIL/uL (ref 3.87–5.11)
RDW: 16.7 % — ABNORMAL HIGH (ref 11.5–15.5)
WBC: 8 10*3/uL (ref 4.0–10.5)
nRBC: 0 % (ref 0.0–0.2)

## 2023-04-19 LAB — MAGNESIUM: Magnesium: 2.8 mg/dL — ABNORMAL HIGH (ref 1.7–2.4)

## 2023-04-19 LAB — PROCALCITONIN: Procalcitonin: <0.1 ng/mL

## 2023-04-19 LAB — PHOSPHORUS: Phosphorus: 4.3 mg/dL (ref 2.5–4.6)

## 2023-04-19 MED ORDER — DEXTROSE IN LACTATED RINGERS 5 % IV SOLN: INTRAVENOUS | Status: DC

## 2023-04-19 MED ORDER — DEXTROSE 50 % IV SOLN: 50.0000 mL | Freq: Four times a day (QID) | INTRAVENOUS | Status: DC | PRN

## 2023-04-19 MED ORDER — DEXTROSE 50 % IV SOLN
25.0000 mL | Freq: Four times a day (QID) | INTRAVENOUS | Status: DC | PRN
  Administered 2023-04-19 (×2): 25 mL via INTRAVENOUS
  Filled 2023-04-19 (×2): qty 50

## 2023-04-19 MED ORDER — SODIUM CHLORIDE 0.9 % IV SOLN
1.0000 g | INTRAVENOUS | Status: DC
  Administered 2023-04-19 – 2023-04-21 (×2): 1 g via INTRAVENOUS
  Filled 2023-04-19 (×4): qty 10

## 2023-04-19 NOTE — Plan of Care (Signed)

## 2023-04-19 NOTE — Progress Notes (Signed)
Patient ID: [name], female   DOB: [birth year], 77 y.o.   MRN: [id number]     SURGICAL PROGRESS NOTE   Hospital Day(s): 1.   Interval History: Patient seen and examined, no acute events or new complaints overnight. Patient reports not feeling better.  She endorses persistent soreness in the abdomen.  Denies any nausea or vomiting.  Denies passing gas.  Soreness in the abdomen is generalized.  No pain radiation.  Patient cannot identify any alleviating or aggravating factors.  NGT draining 300 cc since placement.  Abdominal x-ray last night shows persistent small bowel dilation with contrast in the small intestine.  No contrast seen in the large intestine yet.  I personally evaluated the images.  Vital signs in last 24 hours: [min-max] current  Temp:  [98.1 F (36.7 C)-100.5 F (38.1 C)] 98.1 F (36.7 C) (08/11 0751) Pulse Rate:  [84-92] 84 (08/11 0751) Resp:  [17-18] 17 (08/11 0751) BP: (123-131)/(74-82) 131/77 (08/11 0751) SpO2:  [93 %-95 %] 95 % (08/11 0751)     Height: 5\' 9"  (175.3 cm) Weight: 95.3 kg BMI (Calculated): 31   Physical Exam:  Constitutional: alert, cooperative and no distress  Respiratory: breathing non-labored at rest  Cardiovascular: regular rate and sinus rhythm  Gastrointestinal: soft, tender, and distended  Labs:     Latest Ref Rng & Units 04/19/2023    4:30 AM 04/18/2023    5:19 AM 04/18/2023    3:45 AM  CBC  WBC 4.0 - 10.5 K/uL 8.0  13.3  14.2   Hemoglobin 12.0 - 15.0 g/dL 88.4  16.6  06.3   Hematocrit 36.0 - 46.0 % 40.1  40.5  42.6   Platelets 150 - 400 K/uL 275  308  334       Latest Ref Rng & Units 04/19/2023    4:30 AM 04/18/2023    3:45 AM 04/18/2023   12:38 AM  CMP  Glucose 70 - 99 mg/dL 59   016   BUN 8 - 23 mg/dL 41   43   Creatinine 0.10 - 1.00 mg/dL 9.32  3.55  7.32   Sodium 135 - 145 mmol/L 143   139   Potassium 3.5 - 5.1 mmol/L 3.8   4.1   Chloride 98 - 111 mmol/L 107   98   CO2 22 - 32 mmol/L 26   27   Calcium 8.9 - 10.3 mg/dL 8.1    8.6   Total Protein 6.5 - 8.1 g/dL   7.4   Total Bilirubin 0.3 - 1.2 mg/dL   1.5   Alkaline Phos 38 - 126 U/L   51   AST 15 - 41 U/L   11   ALT 0 - 44 U/L   11      Assessment/Plan:  77 y.o. female with small bowel obstruction, complicated by pertinent comorbidities including chronic kidney disease, DVT on anticoagulation, metastatic breast cancer to brain CHF.   Small bowel obstruction -Continue with abdominal distention. -X-ray continue with persistent small bowel dilation with contrast in the small intestine. -Will give another 24 hours before making final decision of surgical intervention -Will repeat x-ray in 24 hours for further evaluation -Continue IV hydration -Continue to maintain electrolytes within normal limits -Pain management -Encouraged the patient to ambulate  History of DVT -Consider heparin drip if anticoagulation is needed.  This can be placed on hold 6 hours before any surgical intervention.  Gae Gallop, MD

## 2023-04-19 NOTE — Progress Notes (Signed)
Progress Note   Patient: [name] STM:[id number] DOB: [birth year] DOA: 04/18/2023     1 DOS: the patient was seen and examined on 04/19/2023   Brief hospital course: [name] is a 77 y.o. 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  CT abd/pelvis showed high-grade small-bowel obstruction with transition point within the right mid abdomen. Placed NPO, fluids, general surgery consult.    Principal Problem:   SBO (small bowel obstruction) (HCC) Active Problems:   Chronic diastolic CHF (congestive heart failure) (HCC)   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)   Coffee ground emesis   Azotemia   DVT (deep venous thrombosis) (HCC)   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)   Leukocytosis   Stage 3a chronic kidney disease (HCC)   OSA (obstructive sleep apnea)   Malignant neoplasm of right female breast (HCC)   Peripheral neuropathy   Small bowel obstruction (HCC)   Hypoglycemia   Assessment and Plan: SBO (small bowel obstruction) (HCC) Patient is seen by general surgery, continue n.p.o. and fluids.  Patient still has no bowel movement, but abdominal pain seems to be better. Continue NG suction.   Hypoglycemia. Secondary to n.p.o. status, added D5 to IV fluids.  Low-grade fever. Patient had a low-grade fever last night, this could be secondary to metastatic cancer. But need to rule out infection, obtain 2 sets of blood culture, chest x-ray and a UA.  Also check procalcitonin level.  Hold off antibiotic until test results available.  Coffee ground emesis Secondary  to nausea vomiting, continue Protonix.   Chronic kidney disease disease stage IIIa. Renal function still stable.   Chronic diastolic CHF (congestive heart failure) (HCC) Systolic congestive heart failure ruled out. Most recent echocardiogram showed ejection fraction 50 to 55%, no evidence of volume overload.   Cancer of right breast metastatic to brain [facility]) - We will continue Arimidex and Keppra.     Peripheral neuropathy - We will continue Lyrica.      Subjective: Patient doing well today, denies any abdominal pain or nausea vomiting.  Low-grade fever noted. No bowel movement,  Physical Exam: Vitals:   04/18/23 0805 04/18/23 2028 04/19/23 0328 04/19/23 0751  BP: (!) 155/89 123/74 127/82 131/77  Pulse: 89 92 85 84  Resp:  18 18 17   Temp: 98.4 F (36.9 C) (!) 100.5 F (38.1 C) 98.4 F (36.9 C) 98.1 F (36.7 C)  TempSrc: Oral Oral Oral Oral  SpO2: 98% 93% 93% 95%  Weight:      Height:       General exam: Appears calm and comfortable  Respiratory system: Clear to auscultation. Respiratory effort normal. Cardiovascular system: S1 & S2 heard, RRR. No JVD, murmurs, rubs, gallops or clicks. No pedal edema. Gastrointestinal system: Abdomen is nondistended, soft and nontender. No organomegaly or masses felt. Normal bowel sounds heard. Central nervous system: Alert and oriented x2. No focal neurological deficits. Extremities: Symmetric 5 x 5 power. Skin: No rashes, lesions or ulcers Psychiatry: Mood & affect appropriate.    Data Reviewed:  Lab results reviewed.  Family Communication: Daughter updated  Disposition: Status is: Inpatient Remains inpatient appropriate because: Severity of disease,  IV treatment, possible surgery.     Time spent: 50 minutes  Author: Marrion Coy, MD 04/19/2023 9:30 AM  For on call review [email or web address].

## 2023-04-20 ENCOUNTER — Other Ambulatory Visit: Payer: Self-pay

## 2023-04-20 ENCOUNTER — Encounter
Admission: EM | Disposition: A | Discharge status: Skilled Nursing Facility | Payer: Self-pay | Source: Home / Self Care | Attending: Internal Medicine

## 2023-04-20 ENCOUNTER — Inpatient Hospital Stay: Payer: Medicare Other | Attending: Oncology

## 2023-04-20 ENCOUNTER — Inpatient Hospital Stay: Payer: Medicare Other

## 2023-04-20 ENCOUNTER — Encounter: Payer: Self-pay | Admitting: Family Medicine

## 2023-04-20 ENCOUNTER — Ambulatory Visit: Payer: Medicare Other

## 2023-04-20 ENCOUNTER — Inpatient Hospital Stay: Payer: Medicare Other | Admitting: Certified Registered"

## 2023-04-20 DIAGNOSIS — J690 Pneumonitis due to inhalation of food and vomit: Secondary | ICD-10-CM

## 2023-04-20 DIAGNOSIS — E870 Hyperosmolality and hypernatremia: Secondary | ICD-10-CM | POA: Insufficient documentation

## 2023-04-20 DIAGNOSIS — G9341 Metabolic encephalopathy: Secondary | ICD-10-CM

## 2023-04-20 DIAGNOSIS — K56609 Unspecified intestinal obstruction, unspecified as to partial versus complete obstruction: Secondary | ICD-10-CM | POA: Diagnosis not present

## 2023-04-20 HISTORY — PX: LAPAROTOMY: SHX154

## 2023-04-20 LAB — GLUCOSE, CAPILLARY
Glucose-Capillary: 106 mg/dL — ABNORMAL HIGH (ref 70–99)
Glucose-Capillary: 114 mg/dL — ABNORMAL HIGH (ref 70–99)
Glucose-Capillary: 137 mg/dL — ABNORMAL HIGH (ref 70–99)
Glucose-Capillary: 153 mg/dL — ABNORMAL HIGH (ref 70–99)
Glucose-Capillary: 170 mg/dL — ABNORMAL HIGH (ref 70–99)

## 2023-04-20 LAB — TYPE AND SCREEN
ABO/RH(D): O POS
Antibody Screen: NEGATIVE

## 2023-04-20 SURGERY — LAPAROTOMY, EXPLORATORY
Anesthesia: General | Site: Abdomen

## 2023-04-20 MED ORDER — INDOCYANINE GREEN 25 MG IV SOLR
INTRAVENOUS | Status: DC | PRN
  Administered 2023-04-20: 5 mg via INTRAVENOUS

## 2023-04-20 MED ORDER — FENTANYL CITRATE (PF) 100 MCG/2ML IJ SOLN
INTRAMUSCULAR | Status: AC
  Filled 2023-04-20: qty 2

## 2023-04-20 MED ORDER — PROMETHAZINE HCL 25 MG/ML IJ SOLN: 6.2500 mg | INTRAMUSCULAR | Status: DC | PRN

## 2023-04-20 MED ORDER — KETAMINE HCL 10 MG/ML IJ SOLN
INTRAMUSCULAR | Status: DC | PRN
  Administered 2023-04-20: 20 mg via INTRAVENOUS

## 2023-04-20 MED ORDER — HYDROMORPHONE HCL 1 MG/ML IJ SOLN
INTRAMUSCULAR | Status: AC
  Filled 2023-04-20: qty 1

## 2023-04-20 MED ORDER — PROPOFOL 10 MG/ML IV BOLUS
INTRAVENOUS | Status: AC
  Filled 2023-04-20: qty 20

## 2023-04-20 MED ORDER — EPHEDRINE SULFATE (PRESSORS) 50 MG/ML IJ SOLN
INTRAMUSCULAR | Status: DC | PRN
  Administered 2023-04-20: 2.5 mg via INTRAVENOUS

## 2023-04-20 MED ORDER — HYDROMORPHONE HCL 1 MG/ML IJ SOLN
0.2500 mg | INTRAMUSCULAR | Status: DC | PRN
  Administered 2023-04-20: 0.25 mg via INTRAVENOUS

## 2023-04-20 MED ORDER — FENTANYL CITRATE (PF) 100 MCG/2ML IJ SOLN
INTRAMUSCULAR | Status: DC | PRN
  Administered 2023-04-20 (×2): 50 ug via INTRAVENOUS
  Administered 2023-04-20 (×2): 25 ug via INTRAVENOUS

## 2023-04-20 MED ORDER — DEXAMETHASONE SODIUM PHOSPHATE 10 MG/ML IJ SOLN
INTRAMUSCULAR | Status: DC | PRN
  Administered 2023-04-20: 10 mg via INTRAVENOUS

## 2023-04-20 MED ORDER — PHENYLEPHRINE 80 MCG/ML (10ML) SYRINGE FOR IV PUSH (FOR BLOOD PRESSURE SUPPORT)
PREFILLED_SYRINGE | INTRAVENOUS | Status: DC | PRN
  Administered 2023-04-20 (×4): 80 ug via INTRAVENOUS

## 2023-04-20 MED ORDER — 0.9 % SODIUM CHLORIDE (POUR BTL) OPTIME
TOPICAL | Status: DC | PRN
  Administered 2023-04-20: 500 mL

## 2023-04-20 MED ORDER — DROPERIDOL 2.5 MG/ML IJ SOLN: 0.6250 mg | Freq: Once | INTRAMUSCULAR | Status: DC | PRN

## 2023-04-20 MED ORDER — ROCURONIUM BROMIDE 100 MG/10ML IV SOLN
INTRAVENOUS | Status: DC | PRN
  Administered 2023-04-20: 20 mg via INTRAVENOUS
  Administered 2023-04-20: 30 mg via INTRAVENOUS
  Administered 2023-04-20: 20 mg via INTRAVENOUS

## 2023-04-20 MED ORDER — ACETAMINOPHEN 10 MG/ML IV SOLN
INTRAVENOUS | Status: AC
  Filled 2023-04-20: qty 100

## 2023-04-20 MED ORDER — EPINEPHRINE PF 1 MG/ML IJ SOLN
INTRAMUSCULAR | Status: AC
  Filled 2023-04-20: qty 1

## 2023-04-20 MED ORDER — ACETAMINOPHEN 10 MG/ML IV SOLN: 1000.0000 mg | Freq: Once | INTRAVENOUS | Status: DC | PRN

## 2023-04-20 MED ORDER — LIDOCAINE HCL (CARDIAC) PF 100 MG/5ML IV SOSY
PREFILLED_SYRINGE | INTRAVENOUS | Status: DC | PRN
  Administered 2023-04-20: 80 mg via INTRAVENOUS

## 2023-04-20 MED ORDER — PROPOFOL 10 MG/ML IV BOLUS
INTRAVENOUS | Status: DC | PRN
Start: 2023-04-20 — End: 2023-04-20
  Administered 2023-04-20: 80 mg via INTRAVENOUS

## 2023-04-20 MED ORDER — SUGAMMADEX SODIUM 200 MG/2ML IV SOLN
INTRAVENOUS | Status: DC | PRN
  Administered 2023-04-20: 200 mg via INTRAVENOUS

## 2023-04-20 MED ORDER — SUCCINYLCHOLINE CHLORIDE 200 MG/10ML IV SOSY
PREFILLED_SYRINGE | INTRAVENOUS | Status: DC | PRN
  Administered 2023-04-20: 100 mg via INTRAVENOUS

## 2023-04-20 MED ORDER — CEFAZOLIN SODIUM-DEXTROSE 2-3 GM-%(50ML) IV SOLR
INTRAVENOUS | Status: DC | PRN
  Administered 2023-04-20: 2 g via INTRAVENOUS

## 2023-04-20 MED ORDER — ONDANSETRON HCL 4 MG/2ML IJ SOLN
INTRAMUSCULAR | Status: DC | PRN
  Administered 2023-04-20: 4 mg via INTRAVENOUS

## 2023-04-20 MED ORDER — ACETAMINOPHEN 10 MG/ML IV SOLN
INTRAVENOUS | Status: DC | PRN
  Administered 2023-04-20: 1000 mg via INTRAVENOUS

## 2023-04-20 MED ORDER — KETAMINE HCL 50 MG/5ML IJ SOSY
PREFILLED_SYRINGE | INTRAMUSCULAR | Status: AC
  Filled 2023-04-20: qty 5

## 2023-04-20 MED ORDER — BUPIVACAINE-EPINEPHRINE 0.5% -1:200000 IJ SOLN
INTRAMUSCULAR | Status: DC | PRN
  Administered 2023-04-20: 50 mL via INTRAMUSCULAR

## 2023-04-20 MED ORDER — BUPIVACAINE HCL (PF) 0.5 % IJ SOLN
INTRAMUSCULAR | Status: AC
  Filled 2023-04-20: qty 30

## 2023-04-20 MED ORDER — BUPIVACAINE LIPOSOME 1.3 % IJ SUSP
INTRAMUSCULAR | Status: AC
  Filled 2023-04-20: qty 20

## 2023-04-20 MED ORDER — DEXTROSE-SODIUM CHLORIDE 5-0.45 % IV SOLN
INTRAVENOUS | Status: DC
  Administered 2023-04-20: 600 mL via INTRAVENOUS

## 2023-04-20 SURGICAL SUPPLY — 46 items
APL PRP STRL LF DISP 70% ISPRP (MISCELLANEOUS) ×2
CHLORAPREP W/TINT 26 (MISCELLANEOUS) ×2 IMPLANT
DEFOGGER SCOPE WARMER CLEARIFY (MISCELLANEOUS) IMPLANT
DRAPE LAPAROTOMY 100X77 ABD (DRAPES) ×2 IMPLANT
DRSG OPSITE POSTOP 4X10 (GAUZE/BANDAGES/DRESSINGS) IMPLANT
DRSG OPSITE POSTOP 4X8 (GAUZE/BANDAGES/DRESSINGS) IMPLANT
ELECT BLADE 6.5 EXT (BLADE) ×2 IMPLANT
ELECT CAUTERY BLADE 6.4 (BLADE) ×2 IMPLANT
ELECT REM PT RETURN 9FT ADLT (ELECTROSURGICAL) ×2
ELECTRODE REM PT RTRN 9FT ADLT (ELECTROSURGICAL) ×2 IMPLANT
GLOVE BIO SURGEON STRL SZ 6.5 (GLOVE) ×2 IMPLANT
GLOVE BIOGEL PI IND STRL 6.5 (GLOVE) ×2 IMPLANT
GOWN STRL REUS W/ TWL LRG LVL3 (GOWN DISPOSABLE) ×4 IMPLANT
GOWN STRL REUS W/TWL LRG LVL3 (GOWN DISPOSABLE) ×4
HEMOSTAT SURGICEL 2X3 (HEMOSTASIS) IMPLANT
KIT IMAGING PINPOINTPAQ (MISCELLANEOUS) IMPLANT
KIT PREVENA INCISION MGT20CM45 (CANNISTER) IMPLANT
KIT TURNOVER KIT A (KITS) ×2 IMPLANT
LABEL OR SOLS (LABEL) ×2 IMPLANT
LIGASURE IMPACT 36 18CM CVD LR (INSTRUMENTS) IMPLANT
MANIFOLD NEPTUNE II (INSTRUMENTS) ×2 IMPLANT
NDL HYPO 22X1.5 SAFETY MO (MISCELLANEOUS) ×2 IMPLANT
NEEDLE HYPO 22X1.5 SAFETY MO (MISCELLANEOUS) ×2 IMPLANT
NS IRRIG 1000ML POUR BTL (IV SOLUTION) ×2 IMPLANT
PACK BASIN MAJOR ARMC (MISCELLANEOUS) ×2 IMPLANT
PACK COLON CLEAN CLOSURE (MISCELLANEOUS) ×2 IMPLANT
RELOAD PROXIMATE 75MM BLUE (ENDOMECHANICALS) IMPLANT
RELOAD STAPLE 75 3.8 BLU REG (ENDOMECHANICALS) IMPLANT
RETRACTOR WND ALEXIS-O 25 LRG (MISCELLANEOUS) IMPLANT
RTRCTR WOUND ALEXIS O 25CM LRG (MISCELLANEOUS) ×2
SPONGE T-LAP 18X18 ~~LOC~~+RFID (SPONGE) IMPLANT
STAPLER GUN LINEAR PROX 60 (STAPLE) IMPLANT
STAPLER PROXIMATE 75MM BLUE (STAPLE) IMPLANT
STAPLER SKIN PROX 35W (STAPLE) ×2 IMPLANT
SUT PDS AB 0 CT1 27 (SUTURE) IMPLANT
SUT SILK 2 0 (SUTURE)
SUT SILK 2-0 18XBRD TIE 12 (SUTURE) IMPLANT
SUT SILK 3-0 (SUTURE) IMPLANT
SUT STRATAFIX PDS 30 CT-1 (SUTURE) IMPLANT
SUT VIC AB 2-0 CT1 (SUTURE) IMPLANT
SUT VIC AB 3-0 SH 27 (SUTURE) ×2
SUT VIC AB 3-0 SH 27X BRD (SUTURE) ×2 IMPLANT
SYR 20ML LL LF (SYRINGE) ×4 IMPLANT
TRAP FLUID SMOKE EVACUATOR (MISCELLANEOUS) ×2 IMPLANT
TRAY FOLEY MTR SLVR 16FR STAT (SET/KITS/TRAYS/PACK) ×2 IMPLANT
WATER STERILE IRR 500ML POUR (IV SOLUTION) ×2 IMPLANT

## 2023-04-20 NOTE — Anesthesia Preprocedure Evaluation (Addendum)
Anesthesia Evaluation  Patient identified by MRN, date of birth, ID band Patient awake    Reviewed: Allergy & Precautions, H&P , NPO status , Patient's Chart, lab work & pertinent test results  History of Anesthesia Complications (+) history of anesthetic complications (pt states "hard to wake up" after L breast surgery)  Airway Mallampati: IV  TM Distance: >3 FB Neck ROM: full    Dental  (+) Edentulous Upper, Edentulous Lower   Pulmonary sleep apnea , former smoker 91% on RA   1L [place]         Cardiovascular hypertension, +CHF (chronic diastolic CHF)   Rhythm:Regular Rate:Normal + Systolic murmurs and + Peripheral Edema H/o DVT of right tibial vein with 03/30/2023: IVC FILTER INSERTION  ECHO 2023:  1. Left ventricular ejection fraction, by estimation, is 50 to 55%. The  left ventricle has low normal function. Left ventricular endocardial  border not optimally defined to evaluate regional wall motion. The left  ventricular internal cavity size was  moderately dilated. There is mild left ventricular hypertrophy. Left  ventricular diastolic parameters are consistent with Grade I diastolic  dysfunction (impaired relaxation).   2. Right ventricular systolic function is normal. The right ventricular  size is mildly enlarged. Tricuspid regurgitation signal is inadequate for  assessing PA pressure.   3. Left atrial size was mildly dilated.   4. Right atrial size was mildly dilated.   5. The mitral valve is abnormal. Mild mitral valve regurgitation. No  evidence of mitral stenosis.   6. The aortic valve has an indeterminant number of cusps. There is mild  calcification of the aortic valve. There is moderate thickening of the  aortic valve. Aortic valve regurgitation is mild. Aortic valve  sclerosis/calcification is present, without any   evidence of aortic stenosis.   7. The inferior vena cava is normal in size with <50% respiratory   variability, suggesting right atrial pressure of 8 mmHg.     Neuro/Psych Unspecified hearing loss     Comment:  bilateral  metastatic breast cancer to brain TIA Neuromuscular disease (Neuropathy,Weakness, gait abnormality)  negative psych ROS   GI/Hepatic Neg liver ROS,,,Small bowel obstruction with NG suction Coffee ground emesis Secondary to nausea vomiting    Endo/Other  negative endocrine ROS    Renal/GU Renal InsufficiencyRenal disease     Musculoskeletal   Abdominal  (+) + obese  Peds  Hematology negative hematology ROS (+)   Anesthesia Other Findings Past Medical History: No date: Abnormality of gait 11/20/2017: Acute on chronic diastolic CHF (congestive heart failure)  (HCC) 06/13/2022: Acute respiratory failure with hypoxia (HCC) 03/03/2022: AKI (acute kidney injury) (HCC) 02/05/2005: Breast cancer (HCC)     Comment:  LEFT 3.5 cm invasive mammary carcinoma, T2, N0,; triple               negative, whole breast radiation, cytoxan, taxotere               chemotherapy.  2019: Breast cancer (HCC)     Comment:  right breast No date: CHF (congestive heart failure) (HCC)     Comment:  March 28, 2017 echo: EF 50-55%; 1) echo 07/2011: EF               20-25%, mild concentric hypertrophy, diffuse HK, regional              WMA cannot be excluded, grade 1 DD, mitral valve with  mild regurg, LA mildly dilated). 2) EF 40-45%, mild AS,   No date: Chronic kidney disease 09/10/2022: Chronic kidney disease, stage 3b (HCC) 2006: Complication of anesthesia     Comment:  pt states "hard to wake up" after L breast surgery  No date: Edema No date: GERD (gastroesophageal reflux disease) 09/10/2022: Glucose intolerance No date: Headache No date: Heart murmur No date: Hypertension 06/13/2022: Hypertensive emergency No date: Hypokinesis     Comment:  global, EF 35-45 % from Echo. 12/11/2020: Lower limb ulcer, calf, left, limited to breakdown of  skin  (HCC) 06/14/2018: Malignant neoplasm of breast (female), unspecified site     Comment:  RIGHT T1b, N0; ER/PR positive, HER-2 negative. No date: Neuropathy No date: Obesity No date: Pain in joint, site unspecified 2006: Personal history of chemotherapy     Comment:  left breast 2019: Personal history of radiation therapy     Comment:  right breast 2006: Personal history of radiation therapy     Comment:  left breast No date: Pneumonia 08/22/2022: Pyuria No date: Sleep apnea 03/03/2022: Syncope No date: Tinea pedis No date: Unspecified hearing loss     Comment:  bilateral 03/03/2022: Weakness  Past Surgical History: 2019: BREAST BIOPSY; Right     Comment:  11 oc-INVASIVE MAMMARY CARCINOMA, NO SPECIAL TYPE 2019: BREAST BIOPSY; Right     Comment:  1030 oc- neg 2006: BREAST LUMPECTOMY     Comment:  left breast  No date: CATARACT EXTRACTION     Comment:  left eye 05/25/2006: HERNIA REPAIR     Comment:  Incarcerated omentum in ventral hernia, Ventralight               mesh, combined lap/ open with omental resection.  03/30/2023: IVC FILTER INSERTION; N/A     Comment:  Procedure: IVC FILTER INSERTION;  Surgeon: Annice Needy,              MD;  Location: [facility] INVASIVE CV LAB;  Service:               Cardiovascular;  Laterality: N/A; 06/14/2018: PARTIAL MASTECTOMY WITH NEEDLE LOCALIZATION; Right     Comment:  Procedure: PARTIAL MASTECTOMY WITH NEEDLE LOCALIZATION;               Surgeon: Earline Mayotte, MD;  Location: [facility] ORS;                Service: General;  Laterality: Right; 05/25/2006: PORT-A-CATH REMOVAL 06/14/2018: SENTINEL NODE BIOPSY; Right     Comment:  Procedure: SENTINEL NODE BIOPSY;  Surgeon: Earline Mayotte, MD;  Location: [facility] ORS;  Service: General;                Laterality: Right; No date: TUBAL LIGATION  BMI    Body Mass Index: 31.01 kg/m      Reproductive/Obstetrics negative OB ROS                              Anesthesia Physical Anesthesia Plan  ASA: 3  Anesthesia Plan: General ETT   Post-op Pain Management: Ofirmev IV (intra-op)* and Ketamine IV*   Induction: Rapid sequence and Intravenous  PONV Risk Score and Plan: 4 or greater and Ondansetron, Dexamethasone, Propofol infusion and Droperidol  Airway Management Planned: Oral ETT  Additional Equipment: Arterial line  Intra-op Plan:   Post-operative Plan: Extubation in  OR  Informed Consent: I have reviewed the patients History and Physical, chart, labs and discussed the procedure including the risks, benefits and alternatives for the proposed anesthesia with the patient or authorized representative who has indicated his/her understanding and acceptance.     Dental Advisory Given  Plan Discussed with: CRNA and Surgeon  Anesthesia Plan Comments:         Anesthesia Quick Evaluation

## 2023-04-20 NOTE — Progress Notes (Signed)
Patient ID: [name], female   DOB: 1946/05/20, 77 y.o.   MRN: [id number]     SURGICAL PROGRESS NOTE   Hospital Day(s): 2.   Interval History: Patient seen and examined, no acute events or new complaints overnight. Patient reports not feeling better.  Still feeling abdominal soreness.  No specific location.  No pain radiation.  No alleviating or aggravating factors.  Patient denies passing gas or bowel movement.  Vital signs in last 24 hours: [min-max] current  Temp:  [97.6 F (36.4 C)-98.8 F (37.1 C)] 97.6 F (36.4 C) (08/12 0827) Pulse Rate:  [60-78] 67 (08/12 0827) Resp:  [18-20] 20 (08/12 0827) BP: (121-151)/(71-85) 132/71 (08/12 0827) SpO2:  [90 %-100 %] 100 % (08/12 0842)     Height: 5\' 9"  (175.3 cm) Weight: 95.3 kg BMI (Calculated): 31   Physical Exam:  Constitutional: alert, cooperative and no distress  Respiratory: breathing non-labored at rest  Cardiovascular: regular rate and sinus rhythm  Gastrointestinal: soft, non-tender, but distended  Labs:     Latest Ref Rng & Units 04/20/2023    4:39 AM 04/19/2023    4:30 AM 04/18/2023    5:19 AM  CBC  WBC 4.0 - 10.5 K/uL 7.1  8.0  13.3   Hemoglobin 12.0 - 15.0 g/dL 11.9  14.7  82.9   Hematocrit 36.0 - 46.0 % 37.3  40.1  40.5   Platelets 150 - 400 K/uL 247  275  308       Latest Ref Rng & Units 04/20/2023    4:39 AM 04/19/2023    4:30 AM 04/18/2023    3:45 AM  CMP  Glucose 70 - 99 mg/dL 562  59    BUN 8 - 23 mg/dL 34  41    Creatinine 1.30 - 1.00 mg/dL 8.65  7.84  6.96   Sodium 135 - 145 mmol/L 147  143    Potassium 3.5 - 5.1 mmol/L 3.9  3.8    Chloride 98 - 111 mmol/L 111  107    CO2 22 - 32 mmol/L 29  26    Calcium 8.9 - 10.3 mg/dL 8.1  8.1      Imaging studies: Abdominal x-ray this morning shows persistent small bowel dilation with increased diameter.  Contrast still with small bowel.  Consistent with persistent small bowel obstruction.  I personally evaluated the images.   Assessment/Plan:  77 y.o. female  with small bowel obstruction, complicated by pertinent comorbidities including chronic kidney disease, DVT on anticoagulation, metastatic breast cancer to brain CHF.   Small bowel obstruction -Physical exam and imaging consistent with persistent small bowel obstruction -Unfortunate situation in this patient with recently diagnosed with breast cancer with metastatic disease to brain -Patient also with history of DVT treated with anticoagulation and SVC filter -Also with x-ray concern for pneumonic process -At this point due to the increasing dilation of the small intestine I considered the only alternative for this patient is to proceed with surgical intervention. -I called the daughter on the phone on speaker phone to have a conversation with the patient and the daughter.  I discussed with them the high risk of surgery but at this point is the only alternative -Patient understand the high risk of surgery but she would like to proceed with surgical intervention. -Patient oriented about risks of surgery including bleeding, infection, small bowel perforation, need of bowel resection, risk of anesthesia including cardiac implications, respiratory failure, recurrent DVT, stroke, among others.  The patient reports she understood all  this high risk and she agreed to proceed with surgery.  Gae Gallop, MD

## 2023-04-20 NOTE — Transfer of Care (Signed)
Immediate Anesthesia Transfer of Care Note  Patient: [name]  Procedure(s) Performed: EXPLORATORY LAPAROTOMY, MESSENTARIC BIOPSY AND REMOVAL OF MESH WITH METALLIC TACKS (Abdomen) INDOCYANINE GREEN FLUORESCENCE IMAGING (ICG) (Abdomen)  Patient Location: PACU  Anesthesia Type:General  Level of Consciousness: drowsy  Airway & Oxygen Therapy: Patient Spontanous Breathing and Patient connected to face mask oxygen  Post-op Assessment: Report given to RN and Post -op Vital signs reviewed and stable  Post vital signs: Reviewed and stable  Last Vitals:  Vitals Value Taken Time  BP 159/81   Temp    Pulse 72 04/20/23 1538  Resp 14 04/20/23 1538  SpO2 97 % 04/20/23 1538  Vitals shown include unfiled device data.  Last Pain:  Vitals:   04/20/23 1248  TempSrc: Temporal  PainSc: 0-No pain      Patients Stated Pain Goal: 0 (04/18/23 0440)  Complications: No notable events documented.

## 2023-04-20 NOTE — Progress Notes (Signed)
Progress Note   Patient: [name] QIO:[id number] DOB: 08/20/1946 DOA: 04/18/2023     2 DOS: the patient was seen and examined on 04/20/2023   Brief hospital course: Catiria Delagrange is a 77 y.o. 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  CT abd/pelvis showed high-grade small-bowel obstruction with transition point within the right mid abdomen. Placed NPO, fluids, condition did not improve, surgery scheduled 8/12.   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 and fluids.  KUB performed today showed worsening small bowel distention.  General surgery is scheduled for surgery today.    Hypoglycemia. Glucose is better on D5.  Right lower lobe aspiration pneumonia. Likely urinary tract infection. Acute metabolic encephalopathy. Patient had a low-grade fever, she has been very sleepy the entire day yesterday.  Her UA  was abnormal, urine culture sent out.  Chest x-ray also showed right lower lobe pneumonia. But a procalcitonin level less than 0.1, probably chemical aspiration pneumonia.  But due to abnormal urine, altered mental status, patient is a placed on Rocephin on 8/11. Mental status seems better today.   Coffee ground emesis Secondary to nausea vomiting, continue Protonix.   Chronic kidney disease disease stage IIIa. Hypernatremia. Renal function still stable.  Patient developed hypernatremia, IV fluids changed to D5 half-normal.   Chronic diastolic CHF (congestive heart failure) (HCC) Systolic congestive heart failure ruled out. Most recent echocardiogram showed ejection fraction 50 to 55%, no evidence of volume overload.   Cancer of right breast metastatic to brain [facility]) - We will continue Arimidex and Keppra.     Peripheral neuropathy - We will continue Lyrica.     Subjective:  Patient mental status better today, less sleepy, no confusion. No significant abdominal pain or nausea vomiting.  Still had no bowel movement.   Physical Exam: Vitals:   04/19/23 2057 04/20/23 0409 04/20/23 0827 04/20/23 0842  BP: (!) 151/85 137/76 132/71   Pulse: 78 60 67   Resp: 18 18 20    Temp: 98.8 F (37.1 C) 97.7 F (36.5 C) 97.6 F (36.4 C)   TempSrc:  Oral    SpO2: 90% 100% 91% 100%  Weight:      Height:       General exam: Appears calm and comfortable  Respiratory system: Clear to auscultation. Respiratory effort normal. Cardiovascular system: S1 & S2 heard, RRR. No JVD, murmurs, rubs, gallops or clicks. No pedal edema. Gastrointestinal system: Abdomen is nondistended, soft  and nontender. No organomegaly or masses felt. Normal bowel sounds heard. Central nervous system: Alert and oriented x3. No focal neurological deficits. Extremities: Symmetric 5 x 5 power. Skin: No rashes, lesions or ulcers Psychiatry: Judgement and insight appear normal. Mood & affect appropriate.    Data  Reviewed:  Reviewed x-rays and lab results.  Family Communication: None today  Disposition: Status is: Inpatient Remains inpatient appropriate because: Severity of disease, IV treatment, inpatient procedure.     Time spent: 50 minutes  Author: Marrion Coy, MD 04/20/2023 10:53 AM  For on call review [email or web address].

## 2023-04-20 NOTE — Anesthesia Procedure Notes (Signed)
Arterial Line Insertion Start/End8/08/2023 1:34 PM, 04/20/2023 1:36 PM Performed by: Foye Deer, MD, anesthesiologist  Patient location: Pre-op. Preanesthetic checklist: patient identified, IV checked, site marked, risks and benefits discussed, surgical consent, monitors and equipment checked, pre-op evaluation, timeout performed and anesthesia consent Lidocaine 1% used for infiltration Left, radial was placed Catheter size: 20 G Hand hygiene performed  and maximum sterile barriers used   Attempts: 1 Procedure performed using ultrasound guided technique. Ultrasound Notes:anatomy identified, needle tip was noted to be adjacent to the nerve/plexus identified and no ultrasound evidence of intravascular and/or intraneural injection Following insertion, dressing applied. Post procedure assessment: normal and unchanged  Patient tolerated the procedure well with no immediate complications.

## 2023-04-20 NOTE — Anesthesia Procedure Notes (Signed)
Procedure Name: Intubation Date/Time: 04/20/2023 1:40 PM  Performed by: Joanette Gula, , CRNAPre-anesthesia Checklist: Patient identified, Emergency Drugs available, Suction available and Patient being monitored Patient Re-evaluated:Patient Re-evaluated prior to induction Oxygen Delivery Method: Circle system utilized Preoxygenation: Pre-oxygenation with 100% oxygen Induction Type: IV induction, Rapid sequence and Cricoid Pressure applied Laryngoscope Size: McGraph and 3 Grade View: Grade I Tube type: Oral Tube size: 7.0 mm Number of attempts: 1 Airway Equipment and Method: Stylet Placement Confirmation: ETT inserted through vocal cords under direct vision, positive ETCO2 and breath sounds checked- equal and bilateral Secured at: 20 cm Tube secured with: Tape Dental Injury: Teeth and Oropharynx as per pre-operative assessment

## 2023-04-20 NOTE — Op Note (Signed)
Preoperative diagnosis: Small bowel obstruction.  Postoperative diagnosis: Small bowel obstruction .  Procedure: Exploratory laparotomy and extensive enterolysis. Removal of foreign object including mesh metallic clip Open biopsy of mesenteric lymph node  Anesthesia: GETA  Surgeon: Dr. Hazle Quant, MD  Wound Classification: Clean   Indications: Patient is a 77 y.o. female with history of umbilical hernia repair years ago, who presented 3 days ago with picture consistent with partial small bowel obstruction that progressed to complete bowel obstruction unresponsive to conservative measures.   Findings: 1.  Multiple adhesions to previous hernia and metallic tacks 2.  Enlarged lymph node the mesentery of the ileum 3.  No necrotic bowel.  ICG green shows adequate perfusion to the small bowel that was obstructed and to the bowel where the mesentery ligament was removed.  Description of procedure: The patient was placed in the supine position and general endotracheal anesthesia was induced. Preoperative antibiotics were given. The abdomen was prepped and draped in the usual sterile fashion. A timeout was completed verifying correct patient, procedure, site, positioning, and implant(s) and/or special equipment prior to beginning this procedure.  A vertical midline incision was made from xiphoid to just below the umbilicus. This was deepened through the subcutaneous tissues and hemostasis was achieved with electrocautery. The linea alba was identified and incised and the peritoneal cavity entered with care.  Upon entering the abdominal cavity, there are extensive adhesions of omentum and bowel more prominent in right lower quadrant. Omentum and loops of bowel were carefully dissected from the underside of the abdominal wall using sharp dissection with Metzenbaum scissors.  A piece of mesh and metallic tacks were needed to be removed.  Careful exploration of the abdomen was then performed.  The small  bowel was run from the ligament of Treitz to the ileocecal valve. The bowel was noted to be dilated proximally and collapsed distally with a zone of transition at mid ileum. Multiple adhesions were noted to be obstructing the distal small bowel. All adhesions were divided sharply and all small bowel loops carefully separated. The entire small bowel was then inspected for viability and the absence of any additional obstructing bands. Small bowel content was milked proximally to be aspirated by the nasogastric tube and distally into the colon, again confirming patency and integrity throughout its entire length. The abdominal cavity was copiously washed with saline and hemostasis was obtained.  The fascia was closed with a running suture of PDS 0.  The skin was closed with skin staples.  Negative pressure dressing (DME was applied to the midline wound for a coverage of 20 cm. The patient tolerated the procedure well and was taken to the postanesthesia care unit in stable condition.   Specimen: None  Complications: None  Estimated Blood Loss: 50 mL

## 2023-04-20 NOTE — Plan of Care (Signed)
  Problem: Education: Goal: Knowledge of General Education information will improve Description: Including pain rating scale, medication(s)/side effects and non-pharmacologic comfort measures Outcome: Progressing   Problem: Coping: Goal: Level of anxiety will decrease Outcome: Progressing   Problem: Elimination: Goal: Will not experience complications related to bowel motility Outcome: Progressing Goal: Will not experience complications related to urinary retention Outcome: Progressing   Problem: Pain Managment: Goal: General experience of comfort will improve Outcome: Progressing   Problem: Safety: Goal: Ability to remain free from injury will improve Outcome: Progressing   Problem: Safety: Goal: Ability to remain free from injury will improve Outcome: Progressing   Problem: Skin Integrity: Goal: Risk for impaired skin integrity will decrease Outcome: Progressing

## 2023-04-21 ENCOUNTER — Encounter: Payer: Self-pay | Admitting: General Surgery

## 2023-04-21 ENCOUNTER — Ambulatory Visit: Payer: Medicare Other

## 2023-04-21 DIAGNOSIS — D496 Neoplasm of unspecified behavior of brain: Secondary | ICD-10-CM | POA: Diagnosis not present

## 2023-04-21 DIAGNOSIS — I5032 Chronic diastolic (congestive) heart failure: Secondary | ICD-10-CM | POA: Diagnosis not present

## 2023-04-21 DIAGNOSIS — K56609 Unspecified intestinal obstruction, unspecified as to partial versus complete obstruction: Secondary | ICD-10-CM | POA: Diagnosis not present

## 2023-04-21 LAB — GLUCOSE, CAPILLARY
Glucose-Capillary: 106 mg/dL — ABNORMAL HIGH (ref 70–99)
Glucose-Capillary: 131 mg/dL — ABNORMAL HIGH (ref 70–99)
Glucose-Capillary: 146 mg/dL — ABNORMAL HIGH (ref 70–99)
Glucose-Capillary: 169 mg/dL — ABNORMAL HIGH (ref 70–99)

## 2023-04-21 MED ORDER — MORPHINE SULFATE (PF) 4 MG/ML IV SOLN
4.0000 mg | INTRAVENOUS | Status: DC | PRN
  Administered 2023-04-21 – 2023-04-24 (×3): 4 mg via INTRAVENOUS
  Filled 2023-04-21 (×4): qty 1

## 2023-04-21 MED ORDER — ENOXAPARIN SODIUM 60 MG/0.6ML IJ SOSY
50.0000 mg | PREFILLED_SYRINGE | Freq: Every day | INTRAMUSCULAR | Status: DC
  Administered 2023-04-21 – 2023-04-28 (×8): 50 mg via SUBCUTANEOUS
  Filled 2023-04-21 (×8): qty 0.6

## 2023-04-21 NOTE — Anesthesia Postprocedure Evaluation (Signed)
Anesthesia Post Note  Patient: Shiann Walbert  Procedure(s) Performed: EXPLORATORY LAPAROTOMY, LYSIS OF ADHESIONS WITH  MESSENTARIC BIOPSY AND REMOVAL OF MESH WITH METALLIC TACKS (Abdomen) INDOCYANINE GREEN FLUORESCENCE IMAGING (ICG) (Abdomen)  Patient location during evaluation: PACU Anesthesia Type: General Level of consciousness: awake and alert Pain management: pain level controlled Vital Signs Assessment: post-procedure vital signs reviewed and stable Respiratory status: spontaneous breathing, nonlabored ventilation, respiratory function stable and patient connected to nasal cannula oxygen Cardiovascular status: blood pressure returned to baseline and stable Postop Assessment: no apparent nausea or vomiting Anesthetic complications: no   No notable events documented.   Last Vitals:  Vitals:   04/20/23 1641 04/20/23 2047  BP: (!) 154/83 112/81  Pulse: (!) 57 72  Resp: 15 18  Temp: (!) 36.2 C 36.6 C  SpO2: 98% 100%    Last Pain:  Vitals:   04/20/23 2122  TempSrc:   PainSc: Asleep                 Lenard Simmer

## 2023-04-21 NOTE — Progress Notes (Signed)
PT Cancellation Note  Patient Details Name: [name] MRN: [id number] DOB: March 08, 1946   Cancelled Treatment:    Reason Eval/Treat Not Completed: Other (comment).  Chart reviewed and discussed with care team. Pt is total care at this time, recommend continuing lift transfers at [facility]TC. No skilled PT needs identified. Will sign off.   , 04/21/2023, 11:24 AM Elizabeth Palau, PT, DPT, GCS [phone]

## 2023-04-21 NOTE — Plan of Care (Signed)

## 2023-04-21 NOTE — Progress Notes (Signed)
Progress Note   Patient: [name] ZOX:[id number] DOB: 11-15-1945 DOA: 04/18/2023     3 DOS: the patient was seen and examined on 04/21/2023   Brief hospital course: [name] is a 77 y.o. 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  CT abd/pelvis showed high-grade small-bowel obstruction with transition point within the right mid abdomen. Placed NPO, fluids, condition did not improve, surgery scheduled 8/12.   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 Currently patient is n.p.o. pending bowel recovery. Continue IV fluids and symptomatic treatment.     Hypoglycemia. Glucose is better on D5.   Right lower lobe aspiration pneumonia. Urinary tract infection. Acute metabolic encephalopathy. Patient had a low-grade fever,  she has been very sleepy the entire day 8/11.  Her UA was abnormal, urine culture positive for providencia.  Susceptibility pending.  Chest x-ray also showed right lower lobe pneumonia. But a procalcitonin level less than 0.1, probably chemical aspiration pneumonia.  Patient is placed on Rocephin on 8/11. Mental status has improved.  Will continue 5 days of Rocephin.   Coffee ground emesis Secondary to nausea vomiting, continue Protonix.   Chronic kidney disease disease stage IIIa. Hypernatremia. Condition all improved.   Chronic diastolic CHF (congestive heart failure) (HCC) Systolic congestive heart failure ruled out. Most recent echocardiogram showed ejection fraction 50 to 55%, no evidence of volume overload.   Cancer of right breast metastatic to brain [facility]) - We will continue Arimidex and Keppra.     Peripheral neuropathy - We will continue Lyrica.      Subjective:  Patient still has some intermittent abdominal pain, no nausea vomiting.  No shortness of breath.  Physical Exam: Vitals:   04/20/23 1641 04/20/23 2047 04/21/23 0550 04/21/23 0801  BP: (!) 154/83 112/81 121/68 (!) 147/83  Pulse: (!) 57 72 72 75  Resp: 15 18 16 16   Temp: (!) 97.1 F (36.2 C) 97.9 F (36.6 C) 97.9 F (36.6 C) 98.1 F (36.7 C)  TempSrc: Oral Oral Oral Oral  SpO2: 98% 100% 100% 99%  Weight:      Height:       General exam: Appears calm and comfortable  Respiratory system: Clear to auscultation. Respiratory effort normal. Cardiovascular system: S1 & S2 heard, RRR. No JVD, murmurs, rubs, gallops or clicks. No pedal edema. Gastrointestinal system: Abdomen is nondistended, soft and mildly tender.  No organomegaly or masses felt.  Decreased bowel sounds. Central nervous system: Alert and oriented. No focal neurological deficits. Extremities: Symmetric 5 x 5 power. Skin: No rashes, lesions or ulcers Psychiatry: Judgement and insight appear normal. Mood & affect appropriate.    Data  Reviewed:  Lab results reviewed.  Family Communication: None  Disposition: Status is: Inpatient Remains inpatient appropriate because: Severity of disease, postop.     Time spent: 35 minutes  Author: Marrion Coy, MD 04/21/2023 11:09 AM  For on call review [email or web address].

## 2023-04-21 NOTE — Care Management Important Message (Signed)
Important Message  Patient Details  Name: [name] MRN: [id number] Date of Birth: September 02, 1946   Medicare Important Message Given:  Yes     Olegario Messier A  04/21/2023, 12:30 PM

## 2023-04-21 NOTE — Progress Notes (Signed)
OT Cancellation Note  Patient Details Name: [name] MRN: [id number] DOB: 1946-04-20   Cancelled Treatment:    Reason Eval/Treat Not Completed: OT screened, no needs identified, will sign off;Other (comment). RN in room, assisted RN with rolling L <> R and back to supine, pt able to initiate use of bedrails but requires MAX A + 2 for rolling for repositioning, new gown donned, and peri-hygiene check.. Pt from long term care ([facility]), requires assistance with ADLs and uses a lift for transfers at baseline. No skilled therapy needs identified at this time, OT will sign off. Communicated with care team via secure chat.    L. , OTR/L  04/21/23, 11:24 AM

## 2023-04-21 NOTE — [place] FL2 (Signed)
[place] MEDICAID FL2 LEVEL OF CARE FORM     IDENTIFICATION  Patient Name: [name] Birthdate: 1946-07-03 Sex: female Admission Date (Current Location): 04/18/2023  [facility] and [place] Number:  Chiropodist and Address:  [facility], [address] [place], [place] [address]      Provider Number: [id number]  Attending Physician Name and Address:  Marrion Coy, MD  Relative Name and Phone Number:  Keilah, Clapham (Daughter)  [phone] (    Current Level of Care: Hospital Recommended Level of Care: Skilled Nursing Facility Prior Approval Number:    Date Approved/Denied:   PASRR Number: [id number]  Discharge Plan: SNF    Current Diagnoses: Patient Active Problem List   Diagnosis Date Noted   Hypernatremia 04/20/2023   Acute metabolic encephalopathy 04/20/2023   Hypoglycemia 04/19/2023   Low grade fever 04/19/2023   Aspiration pneumonia of right lower lobe due to vomit (HCC) 04/19/2023   SBO (small bowel obstruction) (HCC) 04/18/2023   Coffee ground emesis 04/18/2023   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/18/2023   Peripheral neuropathy 04/18/2023   Azotemia 04/18/2023   Leukocytosis 04/18/2023   Small bowel obstruction (HCC) 04/18/2023   Acute DVT of right tibial vein (HCC) 03/30/2023   Facial weakness 03/30/2023   DVT (deep venous thrombosis) (HCC) 03/30/2023   Mouth droop due to facial weakness 03/29/2023   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) 03/29/2023   Cellulitis of lower extremity 09/10/2022   Near syncope 08/22/2022   Constipation 08/22/2022   Stage 3a chronic kidney disease (HCC) 06/13/2022   Ulcers of both lower extremities, with unspecified severity (HCC) 03/03/2022   Obesity (BMI 30-39.9) 03/03/2022   Anemia of chronic disease 03/03/2022   PAD (peripheral artery disease) (HCC) 05/30/2020   Malignant neoplasm of right female breast (HCC) 05/13/2018   Venous  insufficiency of both lower extremities 11/20/2017   Chronic diastolic CHF (congestive heart failure) (HCC) 11/20/2017   Weight gain 11/20/2017   Lymphedema 04/19/2017   Benign essential HTN 04/19/2017   OSA (obstructive sleep apnea) 04/19/2017   Morbid obesity (HCC) 04/19/2017   TIA (transient ischemic attack) 03/28/2017   Urinary incontinence 06/27/2016   Breast cancer (HCC) 09/08/2004    Orientation RESPIRATION BLADDER Height & Weight     Time, Situation, Place, Self    Incontinent Weight: 210 lb (95.3 kg) Height:  5\' 9"  (175.3 cm)  BEHAVIORAL SYMPTOMS/MOOD NEUROLOGICAL BOWEL NUTRITION STATUS      Incontinent    AMBULATORY STATUS COMMUNICATION OF NEEDS Skin   Extensive Assist Verbally                         Personal Care Assistance Level of Assistance    Bathing Assistance: Maximum assistance   Dressing Assistance: Maximum assistance     Functional Limitations Info             SPECIAL CARE FACTORS FREQUENCY                       Contractures Contractures Info: Not present    Additional Factors Info  Code Status Code Status Info: FULL             Current Medications (04/21/2023):  This is the current hospital active medication list Current Facility-Administered Medications  Medication Dose Route Frequency Provider Last Rate Last Admin   acetaminophen (TYLENOL) tablet 650 mg  650 mg Oral Q6H PRN Cintron-Diaz,  Lurline Idol, MD       Or   acetaminophen (TYLENOL) suppository 650 mg  650 mg Rectal Q6H PRN Carolan Shiver, MD       anastrozole (ARIMIDEX) tablet 1 mg  1 mg Oral Daily Carolan Shiver, MD   1 mg at 04/20/23 1191   ascorbic acid (VITAMIN C) tablet 500 mg  500 mg Oral BID Carolan Shiver, MD   500 mg at 04/20/23 2049   cefTRIAXone (ROCEPHIN) 1 g in sodium chloride 0.9 % 100 mL IVPB  1 g Intravenous Q24H Carolan Shiver, MD 200 mL/hr at 04/19/23 1339 1 g at 04/19/23 1339   dexamethasone (DECADRON) tablet 4 mg  4 mg Oral  BID WC Carolan Shiver, MD   4 mg at 04/20/23 1734   dextrose 5 % and 0.45 % NaCl infusion   Intravenous Continuous Carolan Shiver, MD 75 mL/hr at 04/20/23 2014 New Bag at 04/20/23 2014   dextrose 50 % solution 50 mL  50 mL Intravenous QID PRN Carolan Shiver, MD       diclofenac Sodium (VOLTAREN) 1 % topical gel 2 g  2 g Topical BID PRN Carolan Shiver, MD       guaiFENesin (MUCINEX) 12 hr tablet 600 mg  600 mg Oral BID PRN Carolan Shiver, MD       hydrocerin (EUCERIN) cream 1 Application  1 Application Topical BID PRN Carolan Shiver, MD       HYDROcodone-acetaminophen (NORCO/VICODIN) 5-325 MG per tablet 1 tablet  1 tablet Oral TID PRN Carolan Shiver, MD       ipratropium-albuterol (DUONEB) 0.5-2.5 (3) MG/3ML nebulizer solution 3 mL  3 mL Nebulization Q4H PRN Carolan Shiver, MD       isosorbide mononitrate (IMDUR) 24 hr tablet 60 mg  60 mg Oral BID Carolan Shiver, MD   60 mg at 04/20/23 2048   loratadine (CLARITIN) tablet 10 mg  10 mg Oral Daily Carolan Shiver, MD   10 mg at 04/20/23 0905   losartan (COZAAR) tablet 50 mg  50 mg Oral Daily Carolan Shiver, MD   50 mg at 04/20/23 0905   morphine (PF) 4 MG/ML injection 4 mg  4 mg Intravenous Q4H PRN Carolan Shiver, MD   4 mg at 04/21/23 0928   ondansetron (ZOFRAN) tablet 4 mg  4 mg Oral Q6H PRN Carolan Shiver, MD       Or   ondansetron (ZOFRAN) injection 4 mg  4 mg Intravenous Q6H PRN Carolan Shiver, MD   4 mg at 04/21/23 0459   oxybutynin (DITROPAN) tablet 5 mg  5 mg Oral BID Carolan Shiver, MD   5 mg at 04/20/23 2049   pantoprazole (PROTONIX) injection 40 mg  40 mg Intravenous Q12H Carolan Shiver, MD   40 mg at 04/21/23 0912   pregabalin (LYRICA) capsule 75 mg  75 mg Oral Daily Carolan Shiver, MD   75 mg at 04/20/23 0905   senna-docusate (Senokot-S) tablet 1 tablet  1 tablet Oral QHS PRN Carolan Shiver, MD       spironolactone  (ALDACTONE) tablet 12.5 mg  12.5 mg Oral Daily Carolan Shiver, MD   12.5 mg at 04/20/23 4782   traZODone (DESYREL) tablet 25 mg  25 mg Oral QHS PRN Carolan Shiver, MD   25 mg at 04/20/23 2049   zinc oxide 20 % ointment 1 Application  1 Application Topical PRN Carolan Shiver, MD         Discharge Medications: Please see discharge summary for a list of discharge  medications.   STOP taking these medications     bisacodyl 5 MG EC tablet Commonly known as: DULCOLAX    ferrous sulfate 325 (65 FE) MG tablet    levETIRAcetam 500 MG tablet Commonly known as: KEPPRA    omeprazole 40 MG capsule Commonly known as: PRILOSEC    traMADol HCl 25 MG Tabs           TAKE these medications     acetaminophen 325 MG tablet Commonly known as: TYLENOL Take 2 tablets (650 mg total) by mouth every 6 (six) hours as needed for mild pain (or Fever >/= 101). What changed: Another medication with the same name was added. Make sure you understand how and when to take each.    acetaminophen 325 MG tablet Commonly known as: TYLENOL Take 2 tablets (650 mg total) by mouth every 6 (six) hours as needed for mild pain (or Fever >/= 101). What changed: You were already taking a medication with the same name, and this prescription was added. Make sure you understand how and when to take each.    anastrozole 1 MG tablet Commonly known as: ARIMIDEX Take 1 tablet (1 mg total) by mouth daily.    ascorbic acid 500 MG tablet Commonly known as: VITAMIN C Take 1 tablet by mouth 2 (two) times daily.    Bladder Control Pads Ex Absorb Misc Apply 1 Dose topically as needed.    Calcium Carbonate-Vitamin D 600-5 MG-MCG Tabs Take 1 tablet by mouth 2 (two) times daily.    dexamethasone 4 MG tablet Commonly known as: DECADRON Take 1 tablet (4 mg total) by mouth 2 (two) times daily with a meal.    diclofenac Sodium 1 % Gel Commonly known as: VOLTAREN Apply 2 g topically 2 (two) times daily as  needed.    Eliquis 5 MG Tabs tablet Generic drug: apixaban Take 5 mg by mouth 2 (two) times daily.    empagliflozin 10 MG Tabs tablet Commonly known as: Jardiance Take 1 tablet (10 mg total) by mouth daily before breakfast.    eucerin lotion Apply topically 2 (two) times daily. Apply to dry skin especially bilateral lower extremities and arms.    feeding supplement Liqd Take 237 mLs by mouth daily.    guaiFENesin 600 MG 12 hr tablet Commonly known as: MUCINEX Take 600 mg by mouth every 12 (twelve) hours.    HYDROcodone-acetaminophen 5-325 MG tablet Commonly known as: NORCO/VICODIN Take 1 tablet by mouth every 6 (six) hours as needed. What changed: when to take this    ipratropium-albuterol 0.5-2.5 (3) MG/3ML Soln Commonly known as: DUONEB Take 3 mLs by nebulization every 4 (four) hours as needed (wheezing).    isosorbide mononitrate 60 MG 24 hr tablet Commonly known as: Imdur Take 1 tablet (60 mg total) by mouth 2 (two) times daily.    loratadine 10 MG tablet Commonly known as: CLARITIN Take 10 mg by mouth daily.    losartan 50 MG tablet Commonly known as: COZAAR Take 50 mg by mouth daily.    ondansetron 4 MG disintegrating tablet Commonly known as: ZOFRAN-ODT Take 4 mg by mouth every 8 (eight) hours as needed.    oxybutynin 5 MG tablet Commonly known as: DITROPAN Take 5 mg by mouth 2 (two) times daily.    pantoprazole 40 MG tablet Commonly known as: PROTONIX Take 1 tablet (40 mg total) by mouth 2 (two) times daily.    polyethylene glycol 17 g packet Commonly known as: MIRALAX / GLYCOLAX Take  17 g by mouth daily. Skip the dose if no constipation    pregabalin 75 MG capsule Commonly known as: LYRICA Take 75 mg by mouth daily.    sennosides-docusate sodium 8.6-50 MG tablet Commonly known as: SENOKOT-S Take 1 tablet by mouth 2 (two) times daily.    spironolactone 25 MG tablet Commonly known as: ALDACTONE Take 12.5 mg by mouth daily.    vitamin B-12  500 MCG tablet Commonly known as: CYANOCOBALAMIN Take 500 mcg by mouth daily.    Vitamin D (Ergocalciferol) 1.25 MG (50000 UNIT) Caps capsule Commonly known as: DRISDOL Take 50,000 Units by mouth once a week.    Zinc Oxide 12.8 % ointment Commonly known as: TRIPLE PASTE Apply barrier cream to buttocks every shift.       Relevant Imaging Results:  Relevant Lab Results:   Additional Information SS#: [id number].  Sosaia Pittinger Genelle Gather, LCSW

## 2023-04-21 NOTE — Progress Notes (Signed)
Patient ID: Temya Breitenstein, female   DOB: Jan 27, 1946, 77 y.o.   MRN: [id number]     SURGICAL PROGRESS NOTE   Hospital Day(s): 3.   Interval History: Patient seen and examined, no acute events or new complaints overnight.  Patient was found sleeping.  Patient arouses to verbal command.  She was answering simple questions.  She endorses that pain is controlled.  She denies passing gas.  No complaints.  Vital signs in last 24 hours: [min-max] current  Temp:  [97.1 F (36.2 C)-98.4 F (36.9 C)] 98.1 F (36.7 C) (08/13 0801) Pulse Rate:  [41-78] 75 (08/13 0801) Resp:  [0-18] 16 (08/13 0801) BP: (112-168)/(68-92) 147/83 (08/13 0801) SpO2:  [95 %-100 %] 99 % (08/13 0801) Weight:  [95.3 kg] 95.3 kg (08/12 1248)     Height: 5\' 9"  (175.3 cm) Weight: 95.3 kg BMI (Calculated): 31   Physical Exam:  Constitutional: No distress  Respiratory: breathing non-labored at rest  Cardiovascular: regular rate and sinus rhythm  Gastrointestinal: soft, non-tender, and non-distended.  Praveena in place  Labs:     Latest Ref Rng & Units 04/20/2023    4:39 AM 04/19/2023    4:30 AM 04/18/2023    5:19 AM  CBC  WBC 4.0 - 10.5 K/uL 7.1  8.0  13.3   Hemoglobin 12.0 - 15.0 g/dL 86.5  78.4  69.6   Hematocrit 36.0 - 46.0 % 37.3  40.1  40.5   Platelets 150 - 400 K/uL 247  275  308       Latest Ref Rng & Units 04/21/2023    4:27 AM 04/20/2023    4:39 AM 04/19/2023    4:30 AM  CMP  Glucose 70 - 99 mg/dL 295  284  59   BUN 8 - 23 mg/dL 27  34  41   Creatinine 0.44 - 1.00 mg/dL 1.32  4.40  1.02   Sodium 135 - 145 mmol/L 142  147  143   Potassium 3.5 - 5.1 mmol/L 4.0  3.9  3.8   Chloride 98 - 111 mmol/L 108  111  107   CO2 22 - 32 mmol/L 28  29  26    Calcium 8.9 - 10.3 mg/dL 7.6  8.1  8.1     Imaging studies: No new pertinent imaging studies   Assessment/Plan:  77 y.o. 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    Small bowel obstruction -S/p lysis of additions. -Incidental lymph node identified and biopsy taken.  Pending results -Continue NGT to suction until return of GI function -Continue IV fluid hydration -No contraindication to start anticoagulation therapy for treatment of history of DVT from surgical standpoint -No contraindication for PT/OT therapies -Continue medical management.  I will continue to follow closely.  Gae Gallop, MD

## 2023-04-22 ENCOUNTER — Ambulatory Visit: Payer: Medicare Other

## 2023-04-22 DIAGNOSIS — E669 Obesity, unspecified: Secondary | ICD-10-CM

## 2023-04-22 DIAGNOSIS — N1831 Chronic kidney disease, stage 3a: Secondary | ICD-10-CM

## 2023-04-22 DIAGNOSIS — D447 Neoplasm of uncertain behavior of aortic body and other paraganglia: Secondary | ICD-10-CM

## 2023-04-22 DIAGNOSIS — D62 Acute posthemorrhagic anemia: Secondary | ICD-10-CM

## 2023-04-22 DIAGNOSIS — I5032 Chronic diastolic (congestive) heart failure: Secondary | ICD-10-CM | POA: Diagnosis not present

## 2023-04-22 DIAGNOSIS — K56609 Unspecified intestinal obstruction, unspecified as to partial versus complete obstruction: Secondary | ICD-10-CM | POA: Diagnosis not present

## 2023-04-22 DIAGNOSIS — D496 Neoplasm of unspecified behavior of brain: Secondary | ICD-10-CM | POA: Diagnosis not present

## 2023-04-22 DIAGNOSIS — K920 Hematemesis: Secondary | ICD-10-CM | POA: Diagnosis not present

## 2023-04-22 LAB — GLUCOSE, CAPILLARY
Glucose-Capillary: 112 mg/dL — ABNORMAL HIGH (ref 70–99)
Glucose-Capillary: 101 mg/dL — ABNORMAL HIGH (ref 70–99)

## 2023-04-22 LAB — MRSA NEXT GEN BY PCR, NASAL: MRSA by PCR Next Gen: NOT DETECTED

## 2023-04-22 MED ORDER — SODIUM CHLORIDE 0.9 % IV SOLN
1.0000 g | INTRAVENOUS | Status: AC
  Administered 2023-04-22 – 2023-04-23 (×2): 1 g via INTRAVENOUS
  Filled 2023-04-22 (×2): qty 10

## 2023-04-22 NOTE — Assessment & Plan Note (Addendum)
Last hemoglobin up to 11.2

## 2023-04-22 NOTE — Assessment & Plan Note (Addendum)
Completed Rocephin.

## 2023-04-22 NOTE — Progress Notes (Signed)
Progress Note   Patient: [name] WGN:[id number] DOB: 1946-08-22 DOA: 04/18/2023     4 DOS: the patient was seen and examined on 04/22/2023   Brief hospital course: [name] is a 77 y.o. 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  CT abd/pelvis showed high-grade small-bowel obstruction with transition point within the right mid abdomen. 8/12.  Patient taken to the operating room for small bowel obstruction and had exploratory laparotomy and extensive enterolysis.  Patient had a biopsy of the mesenteric lymph node. 8/14.  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  Patient has not passed gas yet.  Still with NG tube and n.p.o.  Assessment and Plan: * SBO (small bowel obstruction) (HCC) Patient brought to the operating room on 8/12 by Dr. Hazle [name].  Exploratory laparotomy and extensive enterolysis was done.  Patient n.p.o. and has an NG tube in.  Has not passed gas as of this morning.  Chronic diastolic CHF (congestive heart failure) (HCC) Watch with IV fluids.  Last EF 50%.  Neoplasm of brain causing mass 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 structures [facility]) Breast cancer with metastasis to the brain.  Follow-up oncology as outpatient.  Coffee ground emesis Secondary to nausea vomiting.  On Protonix.  Cancer of right breast metastatic to brain [facility]) Continue Arimidex and Keppra.  DVT (deep venous thrombosis) (HCC) - This involved to the right tibial vein and is fairly recent. - We are temporarily holding off Eliquis as mentioned above.   Stage 3a chronic kidney disease (HCC) Last creatinine 0.78 with  a GFR greater than 60.  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  Acute postoperative anemia due to expected blood loss Hemoglobin drifted down to 10.3 with vigorous IV fluids and surgery.  Aspiration pneumonia of right lower lobe due to vomit (HCC) On Rocephin.  Hypoglycemia Continue D5 in IV fluids.  Peripheral neuropathy Continue Lyrica  Obesity (BMI 30-39.9) BMI 31.01        Subjective: Patient having 5 out of 10 abdominal pain.  Had surgery for small bowel obstruction and lysis of adhesions on 8/12.  Has not passed gas yet.  Physical Exam: Vitals:   04/21/23 1610 04/21/23 2055 04/22/23 0604 04/22/23 0814  BP: (!) 100/56 93/62 (!) 107/57 (!) 106/58  Pulse: 60 67 63 65  Resp: 18 18 18 16   Temp: 98.1 F (36.7 C) 99.2 F (37.3 C) 97.8 F (36.6 C) 98.5 F (36.9 C)  TempSrc: Oral   Oral  SpO2: 100% 97% 100% 100%  Weight:      Height:       Physical Exam HENT:     Head: Normocephalic.     Mouth/Throat:     Pharynx: No oropharyngeal exudate.  Eyes:     General: Lids are normal.     Conjunctiva/sclera: Conjunctivae normal.  Cardiovascular:     Rate and Rhythm: Normal rate and regular rhythm.     Heart sounds: Normal heart sounds, S1 normal and S2 normal.  Pulmonary:     Breath sounds: No decreased breath sounds, wheezing or rales.  Abdominal:     General: Bowel sounds are absent.     Palpations: Abdomen is soft.  Tenderness: There is generalized abdominal tenderness.  Musculoskeletal:     Right lower leg: Swelling present.     Left lower leg: Swelling present.  Skin:    General: Skin is warm.     Findings: No rash.  Neurological:     Mental Status: She is alert and oriented to person, place, and time.     Data Reviewed: Hemoglobin 10.3, creatinine 0.78, potassium 3.5  Family Communication: Updated patient's daughter on the phone  Disposition: Status is: Inpatient Remains inpatient appropriate because: Patient  still with NG tube and n.p.o.  Planned Discharge Destination: Long-term care    Time spent: 29 minutes Case discussed with general surgery and oncology  Author: Alford Highland, MD 04/22/2023 2:44 PM  For on call review [email or web address].

## 2023-04-22 NOTE — Plan of Care (Signed)
Patient alert, NPO, took all scheduled medications. Surgical pain successfully treated with PRN medication. Fluids running. Bed locked in lowest position call bell in reach nursing will continue to monitor.  Problem: Education: Goal: Knowledge of General Education information will improve Description: Including pain rating scale, medication(s)/side effects and non-pharmacologic comfort measures Outcome: Progressing   Problem: Health Behavior/Discharge Planning: Goal: Ability to manage health-related needs will improve Outcome: Progressing   Problem: Clinical Measurements: Goal: Will remain free from infection Outcome: Progressing   Problem: Activity: Goal: Risk for activity intolerance will decrease Outcome: Progressing   Problem: Elimination: Goal: Will not experience complications related to bowel motility Outcome: Progressing Goal: Will not experience complications related to urinary retention Outcome: Progressing   Problem: Pain Managment: Goal: General experience of comfort will improve Outcome: Progressing   Problem: Safety: Goal: Ability to remain free from injury will improve Outcome: Progressing   Problem: Skin Integrity: Goal: Risk for impaired skin integrity will decrease Outcome: Progressing

## 2023-04-22 NOTE — Assessment & Plan Note (Addendum)
Last EF 50%.  No current signs of heart failure

## 2023-04-22 NOTE — Assessment & Plan Note (Signed)
Per [facility].   

## 2023-04-22 NOTE — Assessment & Plan Note (Signed)
BMI 31.01

## 2023-04-22 NOTE — Assessment & Plan Note (Signed)
Breast cancer with metastasis to the brain.  Follow-up oncology as outpatient.

## 2023-04-22 NOTE — Plan of Care (Signed)
  Problem: Activity: Goal: Risk for activity intolerance will decrease Outcome: Progressing   Problem: Nutrition: Goal: Adequate nutrition will be maintained Outcome: Progressing   Problem: Coping: Goal: Level of anxiety will decrease Outcome: Progressing   Problem: Elimination: Goal: Will not experience complications related to bowel motility Outcome: Progressing Goal: Will not experience complications related to urinary retention Outcome: Progressing   Problem: Pain Managment: Goal: General experience of comfort will improve Outcome: Progressing   Problem: Skin Integrity: Goal: Risk for impaired skin integrity will decrease Outcome: Progressing

## 2023-04-22 NOTE — Assessment & Plan Note (Addendum)
Relative low sugar yesterday morning.

## 2023-04-22 NOTE — Progress Notes (Signed)
PHARMACY - PHYSICIAN COMMUNICATION CRITICAL VALUE ALERT - BLOOD CULTURE IDENTIFICATION (BCID)  [name] is an 77 y.o. female who presented to [facility] on 04/18/2023 with a chief complaint of SBO  Assessment:  Gram positive rods found on gram stain.  Most likely contaminant. (include suspected source if known)  Name of physician (or Provider) Contacted: [facility]   Current antibiotics: Ceftriaxone 1 gm IV Q24H   Changes to prescribed antibiotics recommended:  Patient is on recommended antibiotics - No changes needed  No results found for this or any previous visit.  , D 04/22/2023  4:48 AM

## 2023-04-22 NOTE — Progress Notes (Signed)
Patient ID: [name], female   DOB: December 23, 1945, 77 y.o.   MRN: [id number]     SURGICAL PROGRESS NOTE   Hospital Day(s): 4.   Interval History: Patient seen and examined, no acute events or new complaints overnight.  Patient sleeping most of the time.  Arousable.  Denies any complaints.  Denies passing gas or bowel movement.  Vital signs in last 24 hours: [min-max] current  Temp:  [97.8 F (36.6 C)-99.2 F (37.3 C)] 98.5 F (36.9 C) (08/14 0814) Pulse Rate:  [60-67] 65 (08/14 0814) Resp:  [16-18] 16 (08/14 0814) BP: (93-107)/(56-62) 106/58 (08/14 0814) SpO2:  [97 %-100 %] 100 % (08/14 0814)     Height: 5\' 9"  (175.3 cm) Weight: 95.3 kg BMI (Calculated): 31   Physical Exam:  Constitutional: Sleepy but arousable Respiratory: breathing non-labored at rest  Cardiovascular: regular rate and sinus rhythm  Gastrointestinal: soft, non-tender, and non-distended  Labs:     Latest Ref Rng & Units 04/22/2023    3:58 AM 04/20/2023    4:39 AM 04/19/2023    4:30 AM  CBC  WBC 4.0 - 10.5 K/uL 7.5  7.1  8.0   Hemoglobin 12.0 - 15.0 g/dL 95.6  21.3  08.6   Hematocrit 36.0 - 46.0 % 33.5  37.3  40.1   Platelets 150 - 400 K/uL 194  247  275       Latest Ref Rng & Units 04/22/2023    3:58 AM 04/21/2023    4:27 AM 04/20/2023    4:39 AM  CMP  Glucose 70 - 99 mg/dL 578  469  629   BUN 8 - 23 mg/dL 25  27  34   Creatinine 0.44 - 1.00 mg/dL 5.28  4.13  2.44   Sodium 135 - 145 mmol/L 142  142  147   Potassium 3.5 - 5.1 mmol/L 3.5  4.0  3.9   Chloride 98 - 111 mmol/L 110  108  111   CO2 22 - 32 mmol/L 28  28  29    Calcium 8.9 - 10.3 mg/dL 7.4  7.6  8.1     Imaging studies: No new pertinent imaging studies   Assessment/Plan:  77 y.o. 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    Small bowel obstruction -Did not respond to  conservative management.  Patient underwent lysis of additions -Incidental lymph node identified and biopsy taken.  Final result shows extra-adrenal paraganglioma -No evidence of GI function return.  Continue NGT to low intermittent suction.  Continue IV hydration.  Consider TPN. -No contraindication to start anticoagulation therapy for treatment of history of DVT  -Continue medical management.  I will continue to follow.  Gae Gallop, MD

## 2023-04-22 NOTE — Assessment & Plan Note (Addendum)
Last creatinine 0.66 with a GFR greater than 60.

## 2023-04-23 ENCOUNTER — Inpatient Hospital Stay: Payer: Medicare Other

## 2023-04-23 ENCOUNTER — Ambulatory Visit: Payer: Medicare Other

## 2023-04-23 DIAGNOSIS — D496 Neoplasm of unspecified behavior of brain: Secondary | ICD-10-CM | POA: Diagnosis not present

## 2023-04-23 DIAGNOSIS — I5032 Chronic diastolic (congestive) heart failure: Secondary | ICD-10-CM | POA: Diagnosis not present

## 2023-04-23 DIAGNOSIS — K920 Hematemesis: Secondary | ICD-10-CM | POA: Diagnosis not present

## 2023-04-23 DIAGNOSIS — E870 Hyperosmolality and hypernatremia: Secondary | ICD-10-CM

## 2023-04-23 DIAGNOSIS — K56609 Unspecified intestinal obstruction, unspecified as to partial versus complete obstruction: Secondary | ICD-10-CM | POA: Diagnosis not present

## 2023-04-23 LAB — COMPREHENSIVE METABOLIC PANEL
ALT: 8 U/L (ref 0–44)
AST: 10 U/L — ABNORMAL LOW (ref 15–41)
Albumin: 2.3 g/dL — ABNORMAL LOW (ref 3.5–5.0)
Alkaline Phosphatase: 35 U/L — ABNORMAL LOW (ref 38–126)
Anion gap: 6 (ref 5–15)
BUN: 18 mg/dL (ref 8–23)
CO2: 27 mmol/L (ref 22–32)
Calcium: 7.7 mg/dL — ABNORMAL LOW (ref 8.9–10.3)
Chloride: 106 mmol/L (ref 98–111)
Creatinine, Ser: 0.66 mg/dL (ref 0.44–1.00)
GFR, Estimated: >60 mL/min (ref >60)
Glucose, Bld: 103 mg/dL — ABNORMAL HIGH (ref 70–99)
Potassium: 3.6 mmol/L (ref 3.5–5.1)
Sodium: 139 mmol/L (ref 135–145)
Total Bilirubin: 0.2 mg/dL — ABNORMAL LOW (ref 0.3–1.2)
Total Protein: 5.4 g/dL — ABNORMAL LOW (ref 6.5–8.1)

## 2023-04-23 LAB — GLUCOSE, CAPILLARY
Glucose-Capillary: 106 mg/dL — ABNORMAL HIGH (ref 70–99)
Glucose-Capillary: 130 mg/dL — ABNORMAL HIGH (ref 70–99)
Glucose-Capillary: 144 mg/dL — ABNORMAL HIGH (ref 70–99)

## 2023-04-23 MED ORDER — DIATRIZOATE MEGLUMINE & SODIUM 66-10 % PO SOLN
90.0000 mL | Freq: Once | ORAL | Status: AC
  Administered 2023-04-23: 90 mL via NASOGASTRIC

## 2023-04-23 NOTE — Progress Notes (Signed)
Patient ID: [name], female   DOB: 1946-02-16, 77 y.o.   MRN: [id number]     SURGICAL PROGRESS NOTE   Hospital Day(s): 5.   Interval History: Patient seen and examined, no acute events or new complaints overnight. Patient reports "feeling pretty good".  Patient found awake and alert this morning.  She was watching TV comfortably.  Endorses that the abdomen is sore when coughing.  Still denies passing gas.  Vital signs in last 24 hours: [min-max] current  Temp:  [98.1 F (36.7 C)-99 F (37.2 C)] 98.1 F (36.7 C) (08/15 0508) Pulse Rate:  [48-65] 61 (08/15 0508) Resp:  [16-18] 18 (08/14 2042) BP: (106-133)/(55-80) 131/80 (08/15 0508) SpO2:  [89 %-100 %] 89 % (08/15 0508)     Height: 5\' 9"  (175.3 cm) Weight: 95.3 kg BMI (Calculated): 31   Physical Exam:  Constitutional: alert, cooperative and no distress  Respiratory: breathing non-labored at rest  Cardiovascular: regular rate and sinus rhythm  Gastrointestinal: soft, non-tender, and non-distended  Labs:     Latest Ref Rng & Units 04/22/2023    3:58 AM 04/20/2023    4:39 AM 04/19/2023    4:30 AM  CBC  WBC 4.0 - 10.5 K/uL 7.5  7.1  8.0   Hemoglobin 12.0 - 15.0 g/dL 56.3  87.5  64.3   Hematocrit 36.0 - 46.0 % 33.5  37.3  40.1   Platelets 150 - 400 K/uL 194  247  275       Latest Ref Rng & Units 04/22/2023    3:58 AM 04/21/2023    4:27 AM 04/20/2023    4:39 AM  CMP  Glucose 70 - 99 mg/dL 329  518  841   BUN 8 - 23 mg/dL 25  27  34   Creatinine 0.44 - 1.00 mg/dL 6.60  6.30  1.60   Sodium 135 - 145 mmol/L 142  142  147   Potassium 3.5 - 5.1 mmol/L 3.5  4.0  3.9   Chloride 98 - 111 mmol/L 110  108  111   CO2 22 - 32 mmol/L 28  28  29    Calcium 8.9 - 10.3 mg/dL 7.4  7.6  8.1     Imaging studies: No new pertinent imaging studies   Assessment/Plan:  77 y.o. 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    Small bowel obstruction -Did not respond to conservative management.  Patient underwent lysis of additions -Incidental lymph node identified and biopsy taken.  Final result shows extra-adrenal paraganglioma -No evidence of GI function return.  Continue NGT to low intermittent suction.  Continue IV hydration.   -Consider TPN. -Will give Gastrografin and take serial x-rays. -Unfortunately as per PT/OT service patient not a candidate for physical therapy.  As per physical therapy service since patient "is total care" no physical therapy will be beneficial.  I honestly think that at least bedside physical therapy and Occupational Therapy will be beneficial for this patient.  Gae Gallop, MD

## 2023-04-23 NOTE — Progress Notes (Signed)
PT Cancellation Note  Patient Details Name: [name] MRN: [id number] DOB: 03/29/1946   Cancelled Treatment:    Reason Eval/Treat Not Completed: PT screened, no needs identified, will sign off. Patient screened on 04/21/23. No needs identified as patient requires hoyer lift at baseline in facility.    , 04/23/2023, 8:52 AM

## 2023-04-23 NOTE — Plan of Care (Signed)
  Problem: Education: Goal: Knowledge of General Education information will improve Description: Including pain rating scale, medication(s)/side effects and non-pharmacologic comfort measures Outcome: Progressing   Problem: Health Behavior/Discharge Planning: Goal: Ability to manage health-related needs will improve Outcome: Progressing   Problem: Clinical Measurements: Goal: Ability to maintain clinical measurements within normal limits will improve Outcome: Progressing Goal: Will remain free from infection Outcome: Progressing Goal: Diagnostic test results will improve Outcome: Progressing Goal: Respiratory complications will improve Outcome: Progressing Goal: Cardiovascular complication will be avoided Outcome: Progressing   Problem: Elimination: Goal: Will not experience complications related to bowel motility Outcome: Progressing Goal: Will not experience complications related to urinary retention Outcome: Progressing   Problem: Coping: Goal: Level of anxiety will decrease Outcome: Progressing   Problem: Pain Managment: Goal: General experience of comfort will improve Outcome: Progressing   Problem: Safety: Goal: Ability to remain free from injury will improve Outcome: Progressing   Problem: Skin Integrity: Goal: Risk for impaired skin integrity will decrease Outcome: Progressing

## 2023-04-23 NOTE — Progress Notes (Signed)
Progress Note   Patient: [name] OZH:[id number] DOB: 1946-04-28 DOA: 04/18/2023     5 DOS: the patient was seen and examined on 04/23/2023   Brief hospital course: [name] is a 77 y.o. 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  CT abd/pelvis showed high-grade small-bowel obstruction with transition point within the right mid abdomen. 8/12.  Patient taken to the operating room for small bowel obstruction and had exploratory laparotomy and extensive enterolysis.  Patient had a biopsy of the mesenteric lymph node. 8/14.  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  Patient has not passed gas yet.  Still with NG tube and n.p.o. 8/15.  Patient still has not passed gas or had a bowel movement.  Still with NG tube and n.p.o.  Assessment and Plan: * SBO (small bowel obstruction) (HCC) Patient brought to the operating room on 8/12 by Dr. Hazle Quant.  Exploratory laparotomy and extensive enterolysis was done.  Patient is still n.p.o. and has an NG tube in.  Has not passed gas as of this morning.  Chronic diastolic CHF (congestive heart failure) (HCC) Watch with IV fluids.  Last EF 50%.  No current signs of heart failure  Neoplasm of brain causing mass 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 structures [facility]) Breast cancer with metastasis to the brain.  Follow-up oncology as outpatient.  Coffee ground emesis Secondary to nausea vomiting.  On Protonix.  Cancer of right breast metastatic to brain [facility]) Continue Arimidex and Keppra.  DVT (deep venous thrombosis) (HCC) - This involved to the right tibial vein and is fairly  recent. - Currently holding Eliquis.  Stage 3a chronic kidney disease (HCC) Last creatinine 0.66 with a GFR greater than 60.  Extra-adrenal paraganglioma [facility]) Follow-up with oncology as outpatient.  Acute postoperative anemia due to expected blood loss Hemoglobin drifted down to 10.3 with vigorous IV fluids and surgery.  Hypernatremia Sodium normal range.  Aspiration pneumonia of right lower lobe due to vomit (HCC) On Rocephin.  Hypoglycemia Continue D5 in IV fluids.  Peripheral neuropathy Continue Lyrica  Obesity (BMI 30-39.9) BMI 31.01        Subjective: Patient seen this morning and has not passed gas yet.  Had surgery for bowel obstruction.  Physical Exam: Vitals:   04/22/23 1610 04/22/23 2042 04/23/23 0508 04/23/23 0810  BP: 133/68 (!) 108/55 131/80 139/73  Pulse:  (!) 48 61 63  Resp: 18 18  18   Temp: 99 F (37.2 C) 98.9 F (37.2 C) 98.1 F (36.7 C) 98 F (36.7 C)  TempSrc:  Oral  Oral  SpO2: 99% 98% (!) 89% 100%  Weight:      Height:       Physical Exam HENT:     Head: Normocephalic.     Mouth/Throat:     Pharynx: No oropharyngeal exudate.  Eyes:     General: Lids are normal.     Conjunctiva/sclera: Conjunctivae normal.  Cardiovascular:     Rate and Rhythm: Normal rate and regular rhythm.     Heart sounds: Normal heart sounds, S1 normal and S2 normal.  Pulmonary:     Breath sounds: No decreased breath sounds, wheezing or rales.  Abdominal:  General: Bowel sounds are absent.     Palpations: Abdomen is soft.     Tenderness: There is generalized abdominal tenderness.  Musculoskeletal:     Right lower leg: Swelling present.     Left lower leg: Swelling present.  Skin:    General: Skin is warm.     Findings: No rash.  Neurological:     Mental Status: She is alert and oriented to person, place, and time.     Data Reviewed: Creatinine 0.66, potassium 3.6, albumin 2.3  Family Communication: Move things through so  usually  Disposition: Status is: Inpatient Remains inpatient appropriate because: Still n.p.o. with NG tube in.  Surgeon to do Gastrografin study.  Planned Discharge Destination: Long-term care    Time spent: 28 minutes  Author: Alford Highland, MD 04/23/2023 4:08 PM  For on call review [email or web address].

## 2023-04-23 NOTE — Plan of Care (Signed)
?  Problem: Education: ?Goal: Knowledge of General Education information will improve ?Description: Including pain rating scale, medication(s)/side effects and non-pharmacologic comfort measures ?Outcome: Progressing ?  ?Problem: Health Behavior/Discharge Planning: ?Goal: Ability to manage health-related needs will improve ?Outcome: Progressing ?  ?Problem: Clinical Measurements: ?Goal: Ability to maintain clinical measurements within normal limits will improve ?Outcome: Progressing ?  ?Problem: Activity: ?Goal: Risk for activity intolerance will decrease ?Outcome: Progressing ?  ?Problem: Coping: ?Goal: Level of anxiety will decrease ?Outcome: Progressing ?  ?Problem: Pain Managment: ?Goal: General experience of comfort will improve ?Outcome: Progressing ?  ?

## 2023-04-23 NOTE — Assessment & Plan Note (Signed)
Sodium normal range ?

## 2023-04-24 ENCOUNTER — Ambulatory Visit: Payer: Medicare Other

## 2023-04-24 ENCOUNTER — Inpatient Hospital Stay: Payer: Medicare Other

## 2023-04-24 DIAGNOSIS — D496 Neoplasm of unspecified behavior of brain: Secondary | ICD-10-CM | POA: Diagnosis not present

## 2023-04-24 DIAGNOSIS — K920 Hematemesis: Secondary | ICD-10-CM | POA: Diagnosis not present

## 2023-04-24 DIAGNOSIS — E876 Hypokalemia: Secondary | ICD-10-CM

## 2023-04-24 DIAGNOSIS — K56609 Unspecified intestinal obstruction, unspecified as to partial versus complete obstruction: Secondary | ICD-10-CM | POA: Diagnosis not present

## 2023-04-24 DIAGNOSIS — I5032 Chronic diastolic (congestive) heart failure: Secondary | ICD-10-CM | POA: Diagnosis not present

## 2023-04-24 LAB — BASIC METABOLIC PANEL
Anion gap: 7 (ref 5–15)
BUN: 17 mg/dL (ref 8–23)
CO2: 26 mmol/L (ref 22–32)
Calcium: 7.7 mg/dL — ABNORMAL LOW (ref 8.9–10.3)
Chloride: 108 mmol/L (ref 98–111)
Creatinine, Ser: 0.56 mg/dL (ref 0.44–1.00)
GFR, Estimated: >60 mL/min (ref >60)
Glucose, Bld: 117 mg/dL — ABNORMAL HIGH (ref 70–99)
Potassium: 3.4 mmol/L — ABNORMAL LOW (ref 3.5–5.1)
Sodium: 141 mmol/L (ref 135–145)

## 2023-04-24 LAB — GLUCOSE, CAPILLARY
Glucose-Capillary: 107 mg/dL — ABNORMAL HIGH (ref 70–99)
Glucose-Capillary: 118 mg/dL — ABNORMAL HIGH (ref 70–99)
Glucose-Capillary: 127 mg/dL — ABNORMAL HIGH (ref 70–99)
Glucose-Capillary: 143 mg/dL — ABNORMAL HIGH (ref 70–99)
Glucose-Capillary: 82 mg/dL (ref 70–99)

## 2023-04-24 LAB — CBC
HCT: 33.3 % — ABNORMAL LOW (ref 36.0–46.0)
Hemoglobin: 10.3 g/dL — ABNORMAL LOW (ref 12.0–15.0)
MCH: 26.7 pg (ref 26.0–34.0)
MCHC: 30.9 g/dL (ref 30.0–36.0)
MCV: 86.3 fL (ref 80.0–100.0)
Platelets: 227 10*3/uL (ref 150–400)
RBC: 3.86 MIL/uL — ABNORMAL LOW (ref 3.87–5.11)
RDW: 15.6 % — ABNORMAL HIGH (ref 11.5–15.5)
WBC: 7.4 10*3/uL (ref 4.0–10.5)
nRBC: 0 % (ref 0.0–0.2)

## 2023-04-24 MED ORDER — POTASSIUM CHLORIDE 2 MEQ/ML IV SOLN: INTRAVENOUS | Status: DC

## 2023-04-24 MED ORDER — KCL IN DEXTROSE-NACL 20-5-0.9 MEQ/L-%-% IV SOLN
INTRAVENOUS | Status: DC
  Filled 2023-04-24 (×3): qty 1000

## 2023-04-24 NOTE — Evaluation (Signed)
Occupational Therapy Evaluation Patient Details Name: [name] MRN: [id number] DOB: [birth year] Today's Date: 04/24/2023   History of Present Illness Pt is a 77 y.o. African-American female with medical history significant for CHF, breast cancer status post lumpectomy and chemotherapy on Arimidex, with brain metastasis, DVT on anticoagulation with IVC filter, CVA, sage 3B  CKD, GERD, hypertension, who presented to the emergency room with acute onset of abdominal pain with associated nausea and vomiting, workup for SBO.   Clinical Impression   Patient alert, agreeable to PT/OT co-evaluation with some encouragement citing abdominal pain 8/10, RN notified. She reported at her LTC she is wheelchair bound (transfers via sit to stand or hoyer lift). Facility and pt confirmed pt has been working on standing with therapy services recently. Pt required MAX A +2 for sup<>sit EOB and for STS attempt using BUE on sink but unable to fully come to stand. Anticipate pt requires MAX A for LB ADL, MIN A For seated UB ADL, setup and PRN CGA/MIN A for seated grooming tasks. Pt expresses eagerness to improve. Pt will benefit from additional OT services to maximize independence and safety.    If plan is discharge home, recommend the following: Two people to help with walking and/or transfers;A lot of help with bathing/dressing/bathroom;Assistance with cooking/housework;Help with stairs or ramp for entrance;Assist for transportation    Functional Status Assessment  Patient has had a recent decline in their functional status and demonstrates the ability to make significant improvements in function in a reasonable and predictable amount of time.  Equipment Recommendations  Other (comment) (defer to next venue)    Recommendations for Other Services       Precautions / Restrictions Precautions Precautions: Fall Precaution Comments: abdominal incision Restrictions Weight Bearing Restrictions: No       Mobility Bed Mobility Overal bed mobility: Needs Assistance Bed Mobility: Supine to Sit, Sit to Supine     Supine to sit: Max assist, +2 for physical assistance Sit to supine: Max assist, +2 for physical assistance   General bed mobility comments: pt able to assist with scooting up in bed as well but ultimately still maxAx2    Transfers Overall transfer level: Needs assistance Equipment used:  (sink wiht BUE) Transfers: Sit to/from Stand Sit to Stand: Max assist, +2 physical assistance           General transfer comment: buttocks  cleared bed but did not come up fully into standing, tolerated less than 20 seconds      Balance Overall balance assessment: Needs assistance Sitting-balance support: Feet supported Sitting balance-Leahy Scale: Fair Sitting balance - Comments: progressed to fair sitting balance with assistance to position into midline     Standing balance-Leahy Scale: Zero                             ADL either performed or assessed with clinical judgement   ADL                                         General ADL Comments: Pt currently requires MAX A for LB ADL, MIN A For seated UB ADL, setup and PRN CGA/MIN A for seated grooming tasks, and MAX A +2 for mobility     Vision         Perception         Praxis  Pertinent Vitals/Pain Pain Assessment Pain Assessment: 0-10 Pain Score: 8  Pain Location: abdomen Pain Descriptors / Indicators: Aching, Sore Pain Intervention(s): Limited activity within patient's tolerance, Monitored during session, Repositioned, Patient requesting pain meds-RN notified     Extremity/Trunk Assessment Upper Extremity Assessment Upper Extremity Assessment: Generalized weakness   Lower Extremity Assessment Lower Extremity Assessment: Generalized weakness       Communication Communication Communication: No apparent difficulties   Cognition Arousal: Alert Behavior During  Therapy: WFL for tasks assessed/performed Overall Cognitive Status: Within Functional Limits for tasks assessed                                       General Comments       Exercises     Shoulder Instructions      Home Living Family/patient expects to be discharged to::  (LTC at [facility])                                        Prior Functioning/Environment               Mobility Comments: per pt/facility has been standing with PT, wheelchair bound via sit to stand lift (facility referenced hoyer lift) ADLs Comments: pt reported needing transfer assist with sit to stand lift, able to dress upper body with set up, wash with set up, feed herself        OT Problem List: Decreased strength;Pain;Decreased activity tolerance;Impaired balance (sitting and/or standing);Decreased knowledge of use of DME or AE;Obesity;Decreased range of motion      OT Treatment/Interventions: Self-care/ADL training;Therapeutic exercise;Therapeutic activities;Energy conservation;DME and/or AE instruction;Patient/family education;Balance training    OT Goals(Current goals can be found in the care plan section) Acute Rehab OT Goals Patient Stated Goal: get better and have less pain OT Goal Formulation: With patient Time For Goal Achievement: 05/08/23 Potential to Achieve Goals: Fair ADL Goals Pt Will Perform Grooming: sitting;with set-up;with supervision Pt Will Transfer to Toilet: squat pivot transfer;bedside commode;with mod assist (LRAD) Pt Will Perform Toileting - Clothing Manipulation and hygiene: with set-up;with supervision;sitting/lateral leans  OT Frequency: Min 1X/week    Co-evaluation PT/OT/SLP Co-Evaluation/Treatment: Yes Reason for Co-Treatment: To address functional/ADL transfers;For patient/therapist safety PT goals addressed during session: Mobility/safety with mobility;Balance OT goals addressed during session: ADL's and self-care;Strengthening/ROM       AM-PAC OT "6 Clicks" Daily Activity     Outcome Measure Help from another person eating meals?: None Help from another person taking care of personal grooming?: A Little Help from another person toileting, which includes using toliet, bedpan, or urinal?: Total Help from another person bathing (including washing, rinsing, drying)?: A Lot Help from another person to put on and taking off regular upper body clothing?: A Lot Help from another person to put on and taking off regular lower body clothing?: A Lot 6 Click Score: 14   End of Session    Activity Tolerance: Patient tolerated treatment well Patient left: in bed;with call bell/phone within reach;with bed alarm set  OT Visit Diagnosis: Other abnormalities of gait and mobility (R26.89);Muscle weakness (generalized) (M62.81);Pain Pain - part of body:  (abdomen)                Time: 2440-1027 OT Time Calculation (min): 19 min Charges:  OT General Charges $OT Visit: 1 Visit OT Evaluation $OT  Eval Moderate Complexity: 1 Mod  Arman Filter., MPH, MS, OTR/L ascom [phone] 04/24/23, 1:43 PM

## 2023-04-24 NOTE — Progress Notes (Signed)
Progress Note   Patient: [name] VWU:[id number] DOB: 1945-10-05 DOA: 04/18/2023     6 DOS: the patient was seen and examined on 04/24/2023   Brief hospital course: [name] is a 77 y.o. 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  CT abd/pelvis showed high-grade small-bowel obstruction with transition point within the right mid abdomen. 8/12.  Patient taken to the operating room for small bowel obstruction and had exploratory laparotomy and extensive enterolysis.  Patient had a biopsy of the mesenteric lymph node. 8/14.  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  Patient has not passed gas yet.  Still with NG tube and n.p.o. 8/15.  Patient still has not passed gas or had a bowel movement.  Still with NG tube and n.p.o. 8/16.  Patient had bowel movements after Gastrografin put down NG tube.  Today NG tube was removed and started on clear liquid diet.  Assessment and Plan: * SBO (small bowel obstruction) (HCC) Patient brought to the operating room on 8/12 by Dr. Hazle Quant.  Exploratory laparotomy and extensive enterolysis was done.  NG tube removed and patient started on clear liquid diet on 8/16.  Patient had a bowel movement after Gastrografin.  Chronic diastolic CHF (congestive heart failure) (HCC) Watch with IV fluids.  Last EF 50%.  No current signs of heart failure  Neoplasm of brain causing mass 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 structures [facility]) Breast cancer with metastasis to the brain.  Follow-up oncology as outpatient.  Coffee ground emesis Secondary to nausea vomiting.  On Protonix.  Cancer of  right breast metastatic to brain [facility]) Continue Arimidex and Keppra.  DVT (deep venous thrombosis) (HCC) - This involved to the right tibial vein and is fairly recent. - Currently holding Eliquis.  Stage 3a chronic kidney disease (HCC) Last creatinine 0.66 with a GFR greater than 60.  Hypokalemia Added potassium to IV fluids.  Extra-adrenal paraganglioma [facility]) Follow-up with oncology as outpatient.  Acute postoperative anemia due to expected blood loss Hemoglobin drifted down to 10.3 with vigorous IV fluids and surgery.  Hypernatremia Sodium normal range.  Aspiration pneumonia of right lower lobe due to vomit (HCC) Completed Rocephin.  Hypoglycemia Continue D5 in IV fluids.  Peripheral neuropathy Continue Lyrica  Obesity (BMI 30-39.9) BMI 31.01        Subjective: Patient seen this morning.  NG tube was out and patient tolerated clear liquid diet.  Had a bowel movement after Gastrografin placed on NG tube.  Physical Exam: Vitals:   04/23/23 2024 04/24/23 0509 04/24/23 0753 04/24/23 1603  BP: (!) 152/94 110/66 112/76 131/68  Pulse: 77 (!) 52 (!) 52 (!) 51  Resp: 16 12 16 20   Temp: 98.7 F (37.1 C) 98.6 F (37 C) 98 F (36.7 C) 98.6 F (37 C)  TempSrc: Oral Oral Oral   SpO2: 96% 100% 100% 100%  Weight:      Height:       Physical Exam HENT:     Head: Normocephalic.     Mouth/Throat:     Pharynx: No oropharyngeal exudate.  Eyes:     General: Lids are normal.     Conjunctiva/sclera: Conjunctivae normal.  Cardiovascular:  Rate and Rhythm: Normal rate and regular rhythm.     Heart sounds: Normal heart sounds, S1 normal and S2 normal.  Pulmonary:     Breath sounds: No decreased breath sounds, wheezing or rales.  Abdominal:     Palpations: Abdomen is soft.     Tenderness: There is generalized abdominal tenderness.  Musculoskeletal:     Right lower leg: Swelling present.     Left lower leg: Swelling present.  Skin:    General: Skin is warm.      Findings: No rash.  Neurological:     Mental Status: She is alert.     Comments: Unable to straight leg raise.     Data Reviewed: Hemoglobin 10.3, potassium 3.4, creatinine 0.56 Family Communication: Updated patient's daughter on the phone  Disposition: Status is: Inpatient Remains inpatient appropriate because: Started on clear liquid diet today after NG tube was taken out.  Planned Discharge Destination: Long-term care    Time spent: 28 minutes  Author: Alford Highland, MD 04/24/2023 4:56 PM  For on call review [email or web address].

## 2023-04-24 NOTE — Assessment & Plan Note (Signed)
Potassium in normal range

## 2023-04-24 NOTE — Plan of Care (Signed)
  Problem: Education: Goal: Knowledge of General Education information will improve Description: Including pain rating scale, medication(s)/side effects and non-pharmacologic comfort measures Outcome: Progressing   Problem: Clinical Measurements: Goal: Will remain free from infection Outcome: Progressing   Problem: Activity: Goal: Risk for activity intolerance will decrease Outcome: Progressing   Problem: Nutrition: Goal: Adequate nutrition will be maintained Outcome: Progressing   Problem: Coping: Goal: Level of anxiety will decrease Outcome: Progressing   Problem: Elimination: Goal: Will not experience complications related to bowel motility Outcome: Progressing   Problem: Pain Managment: Goal: General experience of comfort will improve Outcome: Progressing   Problem: Safety: Goal: Ability to remain free from injury will improve Outcome: Progressing   Problem: Skin Integrity: Goal: Risk for impaired skin integrity will decrease Outcome: Progressing

## 2023-04-24 NOTE — Care Management Important Message (Signed)
Important Message  Patient Details  Name: Malieka Holness MRN: [id number] Date of Birth: [birth year]   Medicare Important Message Given:  Yes  Patient is in an isolation room so I called and reviewed the Important Message from Medicare by phone [phone]) and she asked me to call her daughter, Harvi Culver [phone]) and review it with her too, which I did and thanked her for her time.     Olegario Messier A Rilan Eiland 04/24/2023, 11:16 AM

## 2023-04-24 NOTE — Evaluation (Signed)
Physical Therapy Evaluation Patient Details Name: [name] MRN: [id number] DOB: [birth year] Today's Date: 04/24/2023  History of Present Illness  Pt is a 77 y.o. African-American female with medical history significant for CHF, breast cancer status post lumpectomy and chemotherapy on Arimidex, with brain metastasis, DVT on anticoagulation with IVC filter, CVA, sage 3B  CKD, GERD, hypertension, who presented to the emergency room with acute onset of abdominal pain with associated nausea and vomiting, workup for SBO.   Clinical Impression  Patient alert, agreeable to PT/OT co-evaluation with some encouragement citing abdominal pain 8/10, RN notified. She reported at her LTC she is wheelchair bound (transfers via sit to stand or hoyer lift). Facility and pt confirmed pt has been working on standing with therapy services recently.  She was able to perform bed mobility with maxAx2. Able to initiate all movements but challenged by abdominal weakness/pain. Sit <> stand with BUE support on sink, maxAx2 and unable to come up into truly standing. Pt returned to supine (and able to assist with scooting in bed) with all needs in reach.  Overall the patient demonstrated deficits (see "PT Problem List") that impede the patient's functional abilities, safety, and mobility and would benefit from skilled PT intervention.         If plan is discharge home, recommend the following: Two people to help with walking and/or transfers;Direct supervision/assist for medications management;Help with stairs or ramp for entrance;Assist for transportation;Assistance with cooking/housework;A lot of help with bathing/dressing/bathroom   Can travel by private vehicle        Equipment Recommendations Other (comment) (TBD at next level of care)  Recommendations for Other Services       Functional Status Assessment Patient has had a recent decline in their functional status and demonstrates the ability to make  significant improvements in function in a reasonable and predictable amount of time.     Precautions / Restrictions Precautions Precautions: Fall Precaution Comments: abdominal incision Restrictions Weight Bearing Restrictions: No      Mobility  Bed Mobility Overal bed mobility: Needs Assistance Bed Mobility: Supine to Sit, Sit to Supine     Supine to sit: Max assist, +2 for physical assistance Sit to supine: Max assist, +2 for physical assistance   General bed mobility comments: pt able to assist with scooting up in bed as well but ultimately still maxAx2    Transfers Overall transfer level: Needs assistance Equipment used:  (sink with BUE) Transfers: Sit to/from Stand Sit to Stand: Max assist, +2 safety/equipment           General transfer comment: buttocks  cleared bed but did not come up fully into standing, tolerated less than 20 seconds    Ambulation/Gait                  Stairs            Wheelchair Mobility     Tilt Bed    Modified Rankin (Stroke Patients Only)       Balance Overall balance assessment: Needs assistance Sitting-balance support: Feet supported Sitting balance-Leahy Scale: Fair Sitting balance - Comments: progressed to fair sitting balance with assistance to position into midline     Standing balance-Leahy Scale: Zero                               Pertinent Vitals/Pain Pain Assessment Pain Assessment: 0-10 Pain Score: 8  Pain Location: abdomen Pain Descriptors / Indicators:  Aching, Sore Pain Intervention(s): Limited activity within patient's tolerance, Monitored during session, Repositioned, Patient requesting pain meds-RN notified    Home Living Family/patient expects to be discharged to::  (LTC at [facility])                        Prior Function               Mobility Comments: per pt/facility has been standing with PT, wheelchair bound via sit to stand lift (facility referenced hoyer  lift) ADLs Comments: pt reported needing transfer assist with sit to stand lift, able to dress upper body with set up, wash with set up, feed herself     Extremity/Trunk Assessment   Upper Extremity Assessment Upper Extremity Assessment: Generalized weakness    Lower Extremity Assessment Lower Extremity Assessment: Generalized weakness       Communication   Communication Communication: No apparent difficulties  Cognition Arousal: Alert Behavior During Therapy: WFL for tasks assessed/performed Overall Cognitive Status: Within Functional Limits for tasks assessed                                          General Comments      Exercises     Assessment/Plan    PT Assessment Patient needs continued PT services  PT Problem List Decreased strength;Decreased activity tolerance;Decreased balance;Decreased mobility;Decreased knowledge of precautions       PT Treatment Interventions Neuromuscular re-education;DME instruction;Gait training;Stair training;Patient/family education;Functional mobility training;Wheelchair mobility training;Therapeutic activities;Therapeutic exercise;Balance training    PT Goals (Current goals can be found in the Care Plan section)  Acute Rehab PT Goals Patient Stated Goal: to return to PLOF PT Goal Formulation: With patient Time For Goal Achievement: 05/08/23 Potential to Achieve Goals: Fair    Frequency Min 1X/week     Co-evaluation PT/OT/SLP Co-Evaluation/Treatment: Yes Reason for Co-Treatment: To address functional/ADL transfers;For patient/therapist safety PT goals addressed during session: Mobility/safety with mobility;Balance OT goals addressed during session: ADL's and self-care;Strengthening/ROM       AM-PAC PT "6 Clicks" Mobility  Outcome Measure Help needed turning from your back to your side while in a flat bed without using bedrails?: A Lot Help needed moving from lying on your back to sitting on the side of a  flat bed without using bedrails?: A Lot Help needed moving to and from a bed to a chair (including a wheelchair)?: Total Help needed standing up from a chair using your arms (e.g., wheelchair or bedside chair)?: Total Help needed to walk in hospital room?: Total Help needed climbing 3-5 steps with a railing? : Total 6 Click Score: 8    End of Session   Activity Tolerance: Patient limited by fatigue;Patient limited by pain Patient left: in bed;with call bell/phone within reach;with bed alarm set Nurse Communication: Mobility status PT Visit Diagnosis: Other abnormalities of gait and mobility (R26.89);Muscle weakness (generalized) (M62.81);Difficulty in walking, not elsewhere classified (R26.2)    Time: 5366-4403 PT Time Calculation (min) (ACUTE ONLY): 21 min   Charges:   PT Evaluation $PT Eval Moderate Complexity: 1 Mod PT Treatments $Therapeutic Activity: 8-22 mins PT General Charges $$ ACUTE PT VISIT: 1 Visit         Olga Coaster PT, DPT 1:39 PM,04/24/23

## 2023-04-24 NOTE — TOC Progression Note (Signed)
Transition of Care [facility]) - Progression Note    Patient Details  Name: [name] MRN: [id number] Date of Birth: 07-27-1946  Transition of Care [facility]) CM/SW Contact  Allena Katz, LCSW Phone Number: 04/24/2023, 10:30 AM  Clinical Narrative:   NG removed today. Pt to trial CLD. CSW to wait on instructions regarding wound vac and pt return to [facility] from [facility].         Expected Discharge Plan and Services                                               Social Determinants of Health (SDOH) Interventions SDOH Screenings   Food Insecurity: No Food Insecurity (04/18/2023)  Housing: Low Risk  (04/18/2023)  Transportation Needs: No Transportation Needs (04/18/2023)  Utilities: Not At Risk (04/18/2023)  Tobacco Use: Medium Risk (04/20/2023)    Readmission Risk Interventions    04/18/2023   10:48 AM 09/12/2022   11:09 AM  Readmission Risk Prevention Plan  Transportation Screening Complete   PCP or Specialist Appt within 3-5 Days  Complete  Social Work Consult for Recovery Care Planning/Counseling  Complete  Palliative Care Screening  Not Applicable  Medication Review [facility]) Complete   HRI or Home Care Consult Complete   Skilled Nursing Facility Complete

## 2023-04-24 NOTE — Plan of Care (Signed)

## 2023-04-24 NOTE — Progress Notes (Signed)
Patient ID: [name], female   DOB: [birth year], 77 y.o.   MRN: [id number]     SURGICAL PROGRESS NOTE   Hospital Day(s): 6.   Interval History: Patient seen and examined, no acute events or new complaints overnight. Patient reports feeling well.  Patient endorses having a good bowel movement.  Denies any nausea.  She had an x-ray of the abdomen after Gastrografin.  Adequate Gastrografin in large intestine.  She had a good bowel movement  Vital signs in last 24 hours: [min-max] current  Temp:  [98 F (36.7 C)-98.7 F (37.1 C)] 98 F (36.7 C) (08/16 0753) Pulse Rate:  [52-77] 52 (08/16 0753) Resp:  [12-16] 16 (08/16 0753) BP: (110-152)/(66-94) 112/76 (08/16 0753) SpO2:  [96 %-100 %] 100 % (08/16 0753)     Height: 5\' 9"  (175.3 cm) Weight: 95.3 kg BMI (Calculated): 31   Physical Exam:  Constitutional: alert, cooperative and no distress  Respiratory: breathing non-labored at rest  Cardiovascular: regular rate and sinus rhythm  Gastrointestinal: soft, non-tender, and non-distended  Labs:     Latest Ref Rng & Units 04/24/2023    4:12 AM 04/22/2023    3:58 AM 04/20/2023    4:39 AM  CBC  WBC 4.0 - 10.5 K/uL 7.4  7.5  7.1   Hemoglobin 12.0 - 15.0 g/dL 24.4  01.0  27.2   Hematocrit 36.0 - 46.0 % 33.3  33.5  37.3   Platelets 150 - 400 K/uL 227  194  247       Latest Ref Rng & Units 04/24/2023    4:12 AM 04/23/2023    8:17 AM 04/22/2023    3:58 AM  CMP  Glucose 70 - 99 mg/dL 536  644  034   BUN 8 - 23 mg/dL 17  18  25    Creatinine 0.44 - 1.00 mg/dL 7.42  5.95  6.38   Sodium 135 - 145 mmol/L 141  139  142   Potassium 3.5 - 5.1 mmol/L 3.4  3.6  3.5   Chloride 98 - 111 mmol/L 108  106  110   CO2 22 - 32 mmol/L 26  27  28    Calcium 8.9 - 10.3 mg/dL 7.7  7.7  7.4   Total Protein 6.5 - 8.1 g/dL  5.4    Total Bilirubin 0.3 - 1.2 mg/dL  0.2    Alkaline Phos 38 - 126 U/L  35    AST 15 - 41 U/L  10    ALT 0 - 44 U/L  8      Assessment/Plan:  77 y.o. 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    Small bowel obstruction -Did not respond to conservative management.  Patient underwent lysis of additions -Incidental lymph node identified and biopsy taken.  Final result shows extra-adrenal paraganglioma -Good sign of bowel function return.  NGT removed.  Will start clear liquid diet.  May be advanced as tolerated. -Hopefully patient will be a good candidate for physical/occupational therapy for mobilization.  Patient high risk for DVT due to bedbound most of the time.  Gae Gallop, MD

## 2023-04-25 DIAGNOSIS — I5032 Chronic diastolic (congestive) heart failure: Secondary | ICD-10-CM | POA: Diagnosis not present

## 2023-04-25 DIAGNOSIS — C50911 Malignant neoplasm of unspecified site of right female breast: Secondary | ICD-10-CM | POA: Diagnosis not present

## 2023-04-25 DIAGNOSIS — D496 Neoplasm of unspecified behavior of brain: Secondary | ICD-10-CM | POA: Diagnosis not present

## 2023-04-25 DIAGNOSIS — K56609 Unspecified intestinal obstruction, unspecified as to partial versus complete obstruction: Secondary | ICD-10-CM | POA: Diagnosis not present

## 2023-04-25 LAB — GLUCOSE, CAPILLARY
Glucose-Capillary: 105 mg/dL — ABNORMAL HIGH (ref 70–99)
Glucose-Capillary: 93 mg/dL (ref 70–99)
Glucose-Capillary: 124 mg/dL — ABNORMAL HIGH (ref 70–99)
Glucose-Capillary: 192 mg/dL — ABNORMAL HIGH (ref 70–99)

## 2023-04-25 MED ORDER — PANTOPRAZOLE SODIUM 40 MG PO TBEC
40.0000 mg | DELAYED_RELEASE_TABLET | Freq: Two times a day (BID) | ORAL | Status: DC
  Administered 2023-04-25 – 2023-04-28 (×6): 40 mg via ORAL
  Filled 2023-04-25 (×6): qty 1

## 2023-04-25 MED ORDER — BOOST / RESOURCE BREEZE PO LIQD CUSTOM
1.0000 | Freq: Three times a day (TID) | ORAL | Status: DC
  Administered 2023-04-25 – 2023-04-27 (×6): 1 via ORAL

## 2023-04-25 MED ORDER — ENSURE ENLIVE PO LIQD
237.0000 mL | ORAL | Status: DC
  Administered 2023-04-25 – 2023-04-27 (×2): 237 mL via ORAL

## 2023-04-25 NOTE — Progress Notes (Signed)
Progress Note   Patient: [name] ZOX:[id number] DOB: 1946/08/14 DOA: 04/18/2023     7 DOS: the patient was seen and examined on 04/25/2023   Brief hospital course: Charlestyn Viramontes is a 77 y.o. 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  CT abd/pelvis showed high-grade small-bowel obstruction with transition point within the right mid abdomen. 8/12.  Patient taken to the operating room for small bowel obstruction and had exploratory laparotomy and extensive enterolysis.  Patient had a biopsy of the mesenteric lymph node. 8/14.  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  Patient has not passed gas yet.  Still with NG tube and n.p.o. 8/15.  Patient still has not passed gas or had a bowel movement.  Still with NG tube and n.p.o. 8/16.  Patient had bowel movements after Gastrografin put down NG tube.  Today NG tube was removed and started on clear liquid diet. 8/17.  Patient started on full liquid diet.  Added boost and Ensure.  Assessment and Plan: * SBO (small bowel obstruction) (HCC) Patient brought to the operating room on 8/12 by Dr. Hazle Quant.  Exploratory laparotomy and extensive enterolysis was done.  Patient started on full liquid diet on 8/17.  Chronic diastolic CHF (congestive heart failure) (HCC) Discontinue fluids since patient tolerating liquid diet.  Last EF 50%.  No current signs of heart failure  Neoplasm of brain causing mass 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 structures [facility]) Breast cancer with metastasis to the brain.  Follow-up oncology as outpatient.  Coffee ground emesis Secondary to nausea  vomiting.  On Protonix.  Cancer of right breast metastatic to brain [facility]) Continue Arimidex and Keppra.  DVT (deep venous thrombosis) (HCC) - This involved to the right tibial vein and is fairly recent. - Currently holding Eliquis.  Stage 3a chronic kidney disease (HCC) Last creatinine 0.56 with a GFR greater than 60.  Hypokalemia Check again tomorrow  Extra-adrenal paraganglioma [facility]) Follow-up with oncology as outpatient.  Acute postoperative anemia due to expected blood loss Hemoglobin drifted down to 10.3 with vigorous IV fluids and surgery.  Hypernatremia Sodium normal range.  Aspiration pneumonia of right lower lobe due to vomit (HCC) Completed Rocephin.  Hypoglycemia Discontinue fluids.  Patient tolerating full liquid diet.  Peripheral neuropathy Continue Lyrica  Obesity (BMI 30-39.9) BMI 31.01        Subjective: Patient feels okay.  Offers complaints of abdominal soreness.  No bowel movement since after the Gastrografin.  Tolerating liquids.  Admitted with small bowel obstruction  Physical Exam: Vitals:   04/24/23 2031 04/25/23 0440 04/25/23 0801 04/25/23 0817  BP: 128/63 128/67 139/72   Pulse: (!) 48 (!) 56 (!) 44 (!) 58  Resp: 16 12 18    Temp: 98.4 F (36.9 C) 98.5 F (36.9 C) 98 F (36.7 C)   TempSrc:      SpO2: 97% 100% 100%   Weight:      Height:       Physical Exam HENT:     Head: Normocephalic.     Mouth/Throat:     Pharynx: No oropharyngeal exudate.  Eyes:     General: Lids are normal.     Conjunctiva/sclera: Conjunctivae normal.  Cardiovascular:  Rate and Rhythm: Normal rate and regular rhythm.     Heart sounds: Normal heart sounds, S1 normal and S2 normal.  Pulmonary:     Breath sounds: No decreased breath sounds, wheezing or rales.  Abdominal:     Palpations: Abdomen is soft.     Tenderness: There is generalized abdominal tenderness.  Musculoskeletal:     Right lower leg: Swelling present.     Left lower leg: Swelling  present.  Skin:    General: Skin is warm.     Findings: No rash.  Neurological:     Mental Status: She is alert.     Comments: Unable to straight leg raise.     Data Reviewed: Last creatinine 0.56  Family Communication: Updated patient's daughter on the phone  Disposition: Status is: Inpatient Remains inpatient appropriate because: Advance to full liquid diet today  Planned Discharge Destination: Long-term care    Time spent: 27 minutes Case discussed with Dr. Hazle Quant  Author: Alford Highland, MD 04/25/2023 3:36 PM  For on call review [email or web address].

## 2023-04-25 NOTE — Progress Notes (Signed)
Patient ID: [name], female   DOB: 03/30/1946, 77 y.o.   MRN: [id number]     SURGICAL PROGRESS NOTE   Hospital Day(s): 7.   Interval History: Patient seen and examined, no acute vents or new complaints overnight. Patient reports feeling well.  She denies any nausea or vomiting with the clear liquids.  Endorses multiple bowel movement yesterday.  Vital signs in last 24 hours: [min-max] current  Temp:  [98 F (36.7 C)-98.6 F (37 C)] 98 F (36.7 C) (08/17 0801) Pulse Rate:  [44-58] 58 (08/17 0817) Resp:  [12-20] 18 (08/17 0801) BP: (128-139)/(63-72) 139/72 (08/17 0801) SpO2:  [97 %-100 %] 100 % (08/17 0801)     Height: 5\' 9"  (175.3 cm) Weight: 95.3 kg BMI (Calculated): 31   Physical Exam:  Constitutional: alert, cooperative and no distress  Respiratory: breathing non-labored at rest  Cardiovascular: regular rate and sinus rhythm  Gastrointestinal: soft, non-tender, and non-distended  Labs:     Latest Ref Rng & Units 04/24/2023    4:12 AM 04/22/2023    3:58 AM 04/20/2023    4:39 AM  CBC  WBC 4.0 - 10.5 K/uL 7.4  7.5  7.1   Hemoglobin 12.0 - 15.0 g/dL 21.3  08.6  57.8   Hematocrit 36.0 - 46.0 % 33.3  33.5  37.3   Platelets 150 - 400 K/uL 227  194  247       Latest Ref Rng & Units 04/24/2023    4:12 AM 04/23/2023    8:17 AM 04/22/2023    3:58 AM  CMP  Glucose 70 - 99 mg/dL 469  629  528   BUN 8 - 23 mg/dL 17  18  25    Creatinine 0.44 - 1.00 mg/dL 4.13  2.44  0.10   Sodium 135 - 145 mmol/L 141  139  142   Potassium 3.5 - 5.1 mmol/L 3.4  3.6  3.5   Chloride 98 - 111 mmol/L 108  106  110   CO2 22 - 32 mmol/L 26  27  28    Calcium 8.9 - 10.3 mg/dL 7.7  7.7  7.4   Total Protein 6.5 - 8.1 g/dL  5.4    Total Bilirubin 0.3 - 1.2 mg/dL  0.2    Alkaline Phos 38 - 126 U/L  35    AST 15 - 41 U/L  10    ALT 0 - 44 U/L  8      Imaging studies: No new pertinent imaging studies   Assessment/Plan:  77 y.o. 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   Small bowel obstruction -Did not respond to conservative management.  Patient underwent lysis of additions -Incidental lymph node identified and biopsy taken.  Final result shows extra-adrenal paraganglioma -Now resolving.  Tolerated clear liquid diet.  Will advance to full liquids. -Continue physical and Occupational Therapy  Gae Gallop, MD

## 2023-04-25 NOTE — Progress Notes (Signed)
PHARMACIST - PHYSICIAN COMMUNICATION  CONCERNING: IV to Oral Route Change Policy  RECOMMENDATION: This patient is receiving pantoprazole by the intravenous route.  Based on criteria approved by the Pharmacy and Therapeutics Committee, the intravenous medication(s) is/are being converted to the equivalent oral dose form(s).  DESCRIPTION: These criteria include: The patient is eating (either orally or via tube) and/or has been taking other orally administered medications for a least 24 hours The patient has no evidence of active gastrointestinal bleeding or impaired GI absorption (gastrectomy, short bowel, patient on TNA or NPO).  If you have questions about this conversion, please contact the Pharmacy Department   Tressie Ellis, [facility] 04/25/2023 9:26 AM

## 2023-04-25 NOTE — Plan of Care (Signed)

## 2023-04-25 NOTE — Plan of Care (Signed)

## 2023-04-26 DIAGNOSIS — C50911 Malignant neoplasm of unspecified site of right female breast: Secondary | ICD-10-CM | POA: Diagnosis not present

## 2023-04-26 DIAGNOSIS — I5032 Chronic diastolic (congestive) heart failure: Secondary | ICD-10-CM | POA: Diagnosis not present

## 2023-04-26 DIAGNOSIS — D496 Neoplasm of unspecified behavior of brain: Secondary | ICD-10-CM | POA: Diagnosis not present

## 2023-04-26 DIAGNOSIS — K56609 Unspecified intestinal obstruction, unspecified as to partial versus complete obstruction: Secondary | ICD-10-CM | POA: Diagnosis not present

## 2023-04-26 LAB — GLUCOSE, CAPILLARY
Glucose-Capillary: 93 mg/dL (ref 70–99)
Glucose-Capillary: 124 mg/dL — ABNORMAL HIGH (ref 70–99)
Glucose-Capillary: 131 mg/dL — ABNORMAL HIGH (ref 70–99)

## 2023-04-26 LAB — COMPREHENSIVE METABOLIC PANEL
ALT: 8 U/L (ref 0–44)
AST: 9 U/L — ABNORMAL LOW (ref 15–41)
Albumin: 2.4 g/dL — ABNORMAL LOW (ref 3.5–5.0)
Alkaline Phosphatase: 35 U/L — ABNORMAL LOW (ref 38–126)
Anion gap: 6 (ref 5–15)
BUN: 21 mg/dL (ref 8–23)
CO2: 26 mmol/L (ref 22–32)
Calcium: 7.8 mg/dL — ABNORMAL LOW (ref 8.9–10.3)
Chloride: 107 mmol/L (ref 98–111)
Creatinine, Ser: 0.66 mg/dL (ref 0.44–1.00)
GFR, Estimated: >60 mL/min (ref >60)
Glucose, Bld: 83 mg/dL (ref 70–99)
Potassium: 3.8 mmol/L (ref 3.5–5.1)
Sodium: 139 mmol/L (ref 135–145)
Total Bilirubin: 0.2 mg/dL — ABNORMAL LOW (ref 0.3–1.2)
Total Protein: 5.4 g/dL — ABNORMAL LOW (ref 6.5–8.1)

## 2023-04-26 LAB — CBC
HCT: 36.3 % (ref 36.0–46.0)
Hemoglobin: 11.2 g/dL — ABNORMAL LOW (ref 12.0–15.0)
MCH: 26.7 pg (ref 26.0–34.0)
MCHC: 30.9 g/dL (ref 30.0–36.0)
MCV: 86.6 fL (ref 80.0–100.0)
Platelets: 244 10*3/uL (ref 150–400)
RBC: 4.19 MIL/uL (ref 3.87–5.11)
RDW: 15.5 % (ref 11.5–15.5)
WBC: 7.6 10*3/uL (ref 4.0–10.5)
nRBC: 0 % (ref 0.0–0.2)

## 2023-04-26 MED ORDER — HYDROCODONE-ACETAMINOPHEN 5-325 MG PO TABS: 1.0000 | ORAL_TABLET | Freq: Three times a day (TID) | ORAL | Status: DC | PRN

## 2023-04-26 MED ORDER — POLYETHYLENE GLYCOL 3350 17 G PO PACK
17.0000 g | PACK | Freq: Every day | ORAL | Status: DC
  Administered 2023-04-26 – 2023-04-27 (×2): 17 g via ORAL
  Filled 2023-04-26 (×2): qty 1

## 2023-04-26 MED ORDER — PROMETHAZINE (PHENERGAN) 6.25MG IN NS 50ML IVPB
6.2500 mg | Freq: Once | INTRAVENOUS | Status: AC
  Administered 2023-04-26: 6.25 mg via INTRAVENOUS
  Filled 2023-04-26: qty 50

## 2023-04-26 MED ORDER — MORPHINE SULFATE (PF) 2 MG/ML IV SOLN: 2.0000 mg | INTRAVENOUS | Status: DC | PRN

## 2023-04-26 NOTE — Progress Notes (Signed)
Patient ID: [name], female   DOB: 12/18/1945, 77 y.o.   MRN: [id number]     SURGICAL PROGRESS NOTE   Hospital Day(s): 8.   Interval History: Patient seen and examined, no acute events or new complaints overnight. Patient reports feeling pretty well.  Patient endorses feeling the intestine moving.  She has been passing gas.  She endorses chronic constipation.  Denies any nausea or vomiting.  Vital signs in last 24 hours: [min-max] current  Temp:  [97.6 F (36.4 C)-99 F (37.2 C)] 98.2 F (36.8 C) (08/18 0759) Pulse Rate:  [60-91] 60 (08/18 0759) Resp:  [16-20] 16 (08/18 0759) BP: (119-134)/(64-87) 129/64 (08/18 0759) SpO2:  [94 %-100 %] 94 % (08/18 0759)     Height: 5\' 9"  (175.3 cm) Weight: 95.3 kg BMI (Calculated): 31   Physical Exam:  Constitutional: alert, cooperative and no distress  Respiratory: breathing non-labored at rest  Cardiovascular: regular rate and sinus rhythm  Gastrointestinal: soft, non-tender, and non-distended. Wound is dry and clean.   Labs:     Latest Ref Rng & Units 04/26/2023    7:41 AM 04/24/2023    4:12 AM 04/22/2023    3:58 AM  CBC  WBC 4.0 - 10.5 K/uL 7.6  7.4  7.5   Hemoglobin 12.0 - 15.0 g/dL 95.6  21.3  08.6   Hematocrit 36.0 - 46.0 % 36.3  33.3  33.5   Platelets 150 - 400 K/uL 244  227  194       Latest Ref Rng & Units 04/26/2023    7:41 AM 04/24/2023    4:12 AM 04/23/2023    8:17 AM  CMP  Glucose 70 - 99 mg/dL 83  578  469   BUN 8 - 23 mg/dL 21  17  18    Creatinine 0.44 - 1.00 mg/dL 6.29  5.28  4.13   Sodium 135 - 145 mmol/L 139  141  139   Potassium 3.5 - 5.1 mmol/L 3.8  3.4  3.6   Chloride 98 - 111 mmol/L 107  108  106   CO2 22 - 32 mmol/L 26  26  27    Calcium 8.9 - 10.3 mg/dL 7.8  7.7  7.7   Total Protein 6.5 - 8.1 g/dL 5.4   5.4   Total Bilirubin 0.3 - 1.2 mg/dL 0.2   0.2   Alkaline Phos 38 - 126 U/L 35   35   AST 15 - 41 U/L 9   10   ALT 0 - 44 U/L 8   8     Imaging studies: No new pertinent imaging  studies   Assessment/Plan:  77 y.o. 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    Small bowel obstruction -Did not respond to conservative management.  Patient underwent lysis of additions -Incidental lymph node identified and biopsy taken.  Final result shows extra-adrenal paraganglioma -Resolving small bowel obstruction.  Advance diet to soft diet. -Patient endorses having issues with chronic constipation.  Will add MiraLAX. -Prevena wound VAC removed. -Encourage patient to continue PT/OT therapies  Gae Gallop, MD

## 2023-04-26 NOTE — Plan of Care (Signed)

## 2023-04-26 NOTE — Progress Notes (Signed)
Progress Note   Patient: [name] YNW:[id number] DOB: [birth year] DOA: 04/18/2023     8 DOS: the patient was seen and examined on 04/26/2023   Brief hospital course: [name] is a 77 y.o. 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  CT abd/pelvis showed high-grade small-bowel obstruction with transition point within the right mid abdomen. 8/12.  Patient taken to the operating room for small bowel obstruction and had exploratory laparotomy and extensive enterolysis.  Patient had a biopsy of the mesenteric lymph node. 8/14.  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  Patient has not passed gas yet.  Still with NG tube and n.p.o. 8/15.  Patient still has not passed gas or had a bowel movement.  Still with NG tube and n.p.o. 8/16.  Patient had bowel movements after Gastrografin put down NG tube.  Today NG tube was removed and started on clear liquid diet. 8/17.  Patient started on full liquid diet.  Added boost and Ensure. 8/18.  Patient advance to solid food.  Notified by nursing staff that she vomited 1 time.  No further vomiting but did not eat very well today.  Assessment and Plan: * SBO (small bowel obstruction) (HCC) Patient brought to the operating room on 8/12 by Dr. Hazle Quant.  Exploratory laparotomy and extensive enterolysis was done.  Patient advance to solid food today on 8/18.  Notified by nursing staff that the patient vomited and did not eat much the rest of today.  Chronic diastolic CHF (congestive heart failure) (HCC) Last EF 50%.  No current signs of heart failure  Neoplasm of  brain causing mass 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 structures [facility]) Breast cancer with metastasis to the brain.  Follow-up oncology as outpatient.  Coffee ground emesis Secondary to nausea vomiting.  On Protonix.  Cancer of right breast metastatic to brain [facility]) Continue Arimidex and Keppra.  DVT (deep venous thrombosis) (HCC) - This involved to the right tibial vein and is fairly recent. - Currently holding Eliquis.  Stage 3a chronic kidney disease (HCC) Last creatinine 0.66 with a GFR greater than 60.  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  Acute postoperative anemia due to expected blood loss Last hemoglobin up to 11.2  Hypernatremia Sodium normal range.  Aspiration pneumonia of right lower lobe due to vomit (HCC) Completed Rocephin.  Hypoglycemia Diet advanced.  Peripheral neuropathy Continue Lyrica  Obesity (BMI 30-39.9) BMI 31.01        Subjective: Patient vomited up the boost that she drank this morning.  As per nursing staff did not eat much today.  Physical Exam: Vitals:   04/25/23 1700 04/25/23 2028 04/26/23 0523 04/26/23 0759  BP: 134/74 124/70 119/87 129/64  Pulse: 60 66 91 60  Resp: 18 20 16 16   Temp: 97.6 F (36.4 C) 99 F (37.2 C) 98.4 F (36.9 C) 98.2 F (36.8 C)  TempSrc:  Oral Oral Oral  SpO2: 97% 99% 100% 94%  Weight:      Height:       Physical Exam HENT:     Head: Normocephalic.     Mouth/Throat:     Pharynx: No oropharyngeal  exudate.  Eyes:     General: Lids are normal.     Conjunctiva/sclera: Conjunctivae normal.  Cardiovascular:     Rate and Rhythm: Normal rate and regular rhythm.     Heart sounds: Normal heart sounds, S1 normal and S2 normal.  Pulmonary:     Breath sounds: No decreased breath sounds, wheezing or rales.  Abdominal:     Palpations: Abdomen is soft.     Tenderness: There is generalized abdominal tenderness.  Musculoskeletal:     Right  lower leg: Swelling present.     Left lower leg: Swelling present.  Skin:    General: Skin is warm.     Findings: No rash.  Neurological:     Mental Status: She is alert.     Comments: Unable to straight leg raise.     Data Reviewed: Potassium 3.8, creatinine 0.66, albumin 2.4, hemoglobin 11.2, white blood cell count 7.6, platelet count 244  Family Communication: Left message for patient's daughter  Disposition: Status is: Inpatient Remains inpatient appropriate because: Diet was advanced today but patient vomited and did not eat much today.  Planned Discharge Destination: Long-term care    Time spent: 27 minutes Case discussed with general surgery  Author: Alford Highland, MD 04/26/2023 4:23 PM  For on call review [email or web address].

## 2023-04-26 NOTE — Plan of Care (Signed)
?  Problem: Education: ?Goal: Knowledge of General Education information will improve ?Description: Including pain rating scale, medication(s)/side effects and non-pharmacologic comfort measures ?Outcome: Progressing ?  ?Problem: Health Behavior/Discharge Planning: ?Goal: Ability to manage health-related needs will improve ?Outcome: Progressing ?  ?Problem: Clinical Measurements: ?Goal: Ability to maintain clinical measurements within normal limits will improve ?Outcome: Progressing ?  ?Problem: Nutrition: ?Goal: Adequate nutrition will be maintained ?Outcome: Progressing ?  ?Problem: Coping: ?Goal: Level of anxiety will decrease ?Outcome: Progressing ?  ?Problem: Pain Managment: ?Goal: General experience of comfort will improve ?Outcome: Progressing ?  ?Problem: Safety: ?Goal: Ability to remain free from injury will improve ?Outcome: Progressing ?  ?Problem: Skin Integrity: ?Goal: Risk for impaired skin integrity will decrease ?Outcome: Progressing ?  ?

## 2023-04-27 ENCOUNTER — Ambulatory Visit: Payer: Medicare Other

## 2023-04-27 DIAGNOSIS — K56609 Unspecified intestinal obstruction, unspecified as to partial versus complete obstruction: Secondary | ICD-10-CM | POA: Diagnosis not present

## 2023-04-27 DIAGNOSIS — K920 Hematemesis: Secondary | ICD-10-CM | POA: Diagnosis not present

## 2023-04-27 DIAGNOSIS — I5032 Chronic diastolic (congestive) heart failure: Secondary | ICD-10-CM | POA: Diagnosis not present

## 2023-04-27 DIAGNOSIS — D496 Neoplasm of unspecified behavior of brain: Secondary | ICD-10-CM | POA: Diagnosis not present

## 2023-04-27 LAB — GLUCOSE, CAPILLARY
Glucose-Capillary: 148 mg/dL — ABNORMAL HIGH (ref 70–99)
Glucose-Capillary: 154 mg/dL — ABNORMAL HIGH (ref 70–99)
Glucose-Capillary: 220 mg/dL — ABNORMAL HIGH (ref 70–99)
Glucose-Capillary: 53 mg/dL — ABNORMAL LOW (ref 70–99)
Glucose-Capillary: 59 mg/dL — ABNORMAL LOW (ref 70–99)
Glucose-Capillary: 65 mg/dL — ABNORMAL LOW (ref 70–99)
Glucose-Capillary: 91 mg/dL (ref 70–99)

## 2023-04-27 NOTE — Progress Notes (Signed)
Progress Note   Patient: [name] ZOX:[id number] DOB: 1945/09/27 DOA: 04/18/2023     9 DOS: the patient was seen and examined on 04/27/2023   Brief hospital course: Catarina Tigue is a 77 y.o. 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  CT abd/pelvis showed high-grade small-bowel obstruction with transition point within the right mid abdomen. 8/12.  Patient taken to the operating room for small bowel obstruction and had exploratory laparotomy and extensive enterolysis.  Patient had a biopsy of the mesenteric lymph node. 8/14.  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  Patient has not passed gas yet.  Still with NG tube and n.p.o. 8/15.  Patient still has not passed gas or had a bowel movement.  Still with NG tube and n.p.o. 8/16.  Patient had bowel movements after Gastrografin put down NG tube.  Today NG tube was removed and started on clear liquid diet. 8/17.  Patient started on full liquid diet.  Added boost and Ensure. 8/18.  Patient advance to solid food.  Notified by nursing staff that she vomited 1 time.  No further vomiting but did not eat very well today. 8/19.  Continue advance diet.  Patient had a bowel movement yesterday.  Watching intake today and potential discharge tomorrow.  Assessment and Plan: * SBO (small bowel obstruction) (HCC) Patient brought to the operating room on 8/12 by Dr. Hazle Quant.  Exploratory laparotomy and extensive enterolysis was done.  Patient advance to solid food on 8/18.  Vomited on 8/18.  Watching how she does with diet on 8/19.  Chronic diastolic CHF  (congestive heart failure) (HCC) Last EF 50%.  No current signs of heart failure  Neoplasm of brain causing mass 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 structures [facility]) Breast cancer with metastasis to the brain.  Follow-up oncology as outpatient.  Coffee ground emesis Secondary to nausea vomiting.  On Protonix.  Cancer of right breast metastatic to brain [facility]) Continue Arimidex and Keppra.  DVT (deep venous thrombosis) (HCC) - This involved to the right tibial vein and is fairly recent. - Currently holding Eliquis.  Stage 3a chronic kidney disease (HCC) Last creatinine 0.66 with a GFR greater than 60.  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  Acute postoperative anemia due to expected blood loss Last hemoglobin up to 11.2  Hypernatremia Sodium normal range.  Aspiration pneumonia of right lower lobe due to vomit (HCC) Completed Rocephin.  Hypoglycemia Relative low sugar this morning.  Not sure if fingerstick is correlating to her actual sugar.  Will check a BMP in the morning.  Continue advance diet.  Peripheral neuropathy Continue Lyrica  Obesity (BMI 30-39.9) BMI 31.01        Subjective: Patient seen this morning.  Had an episode of vomiting yesterday and did not eat much.  She is hoping to do better today with the diet.  Physical Exam: Vitals:   04/26/23 1825 04/26/23 2028 04/27/23 0559 04/27/23 0738  BP: 135/69 101/69 120/66 135/67  Pulse: (!) 59 61 99 (!) 50  Resp: 16 16 18 18   Temp: 98.3 F (36.8 C) 99.5 F (37.5 C) 99 F (37.2 C) 98 F (  36.7 C)  TempSrc:  Oral Oral Oral  SpO2: 98% 98% 98% 95%  Weight:      Height:       Physical Exam HENT:     Head: Normocephalic.     Mouth/Throat:     Pharynx: No oropharyngeal exudate.  Eyes:     General: Lids are normal.     Conjunctiva/sclera: Conjunctivae normal.  Cardiovascular:     Rate and Rhythm: Normal rate and regular rhythm.     Heart  sounds: Normal heart sounds, S1 normal and S2 normal.  Pulmonary:     Breath sounds: No decreased breath sounds, wheezing or rales.  Abdominal:     Palpations: Abdomen is soft.     Tenderness: There is generalized abdominal tenderness.  Musculoskeletal:     Right lower leg: Swelling present.     Left lower leg: Swelling present.  Skin:    General: Skin is warm.     Findings: No rash.  Neurological:     Mental Status: She is alert.     Comments: Unable to straight leg raise.     Data Reviewed: Glucose this morning 53  Family Communication: Spoke with patient's daughter on the phone  Disposition: Status is: Inpatient Remains inpatient appropriate because: See how patient does with her advance diet today  Planned Discharge Destination: Rehab    Time spent: 28 minutes  Author: Alford Highland, MD 04/27/2023 3:44 PM  For on call review [email or web address].

## 2023-04-27 NOTE — TOC Progression Note (Signed)
Transition of Care [facility]) - Progression Note    Patient Details  Name: [name] MRN: [id number] Date of Birth: 1945-10-20  Transition of [facility]) CM/SW Contact  Allena Katz, LCSW Phone Number: 04/27/2023, 10:03 AM  Clinical Narrative:   Per surgeon patient likely to go back to facility tomorrow if does well with intake overnight.          Expected Discharge Plan and Services                                               Social Determinants of Health (SDOH) Interventions SDOH Screenings   Food Insecurity: No Food Insecurity (04/18/2023)  Housing: Low Risk  (04/18/2023)  Transportation Needs: No Transportation Needs (04/18/2023)  Utilities: Not At Risk (04/18/2023)  Tobacco Use: Medium Risk (04/20/2023)    Readmission Risk Interventions    04/18/2023   10:48 AM 09/12/2022   11:09 AM  Readmission Risk Prevention Plan  Transportation Screening Complete   PCP or Specialist Appt within 3-5 Days  Complete  Social Work Consult for Recovery Care Planning/Counseling  Complete  Palliative Care Screening  Not Applicable  Medication Review [employer]) Complete   HRI or Home Care Consult Complete   Skilled Nursing Facility Complete

## 2023-04-27 NOTE — Progress Notes (Signed)
Occupational Therapy Treatment Patient Details Name: [name] MRN: [id number] DOB: Sep 20, 1945 Today's Date: 04/27/2023   History of present illness Pt is a 77 y.o. African-American female with medical history significant for CHF, breast cancer status post lumpectomy and chemotherapy on Arimidex, with brain metastasis, DVT on anticoagulation with IVC filter, CVA, sage 3B  CKD, GERD, hypertension, who presented to the emergency room with acute onset of abdominal pain with associated nausea and vomiting, workup for SBO.   OT comments  Upon entering the room, pt supine in bed and leaning towards the R side. Pt needing total A for bed mobility and declines assistance to attempt EOB. Pt washes face and hands with set up A to obtain needed items. Pt declined further therapeutic intervention secondary to fatigue. Bed alarm activated and call bell within reach.       If plan is discharge home, recommend the following:  Two people to help with walking and/or transfers;A lot of help with bathing/dressing/bathroom;Assistance with cooking/housework;Help with stairs or ramp for entrance;Assist for transportation   Equipment Recommendations  None recommended by OT       Precautions / Restrictions Precautions Precautions: Fall Precaution Comments: abdominal incision       Mobility Bed Mobility Overal bed mobility: Needs Assistance Bed Mobility: Rolling Rolling: Total assist              Transfers                             ADL either performed or assessed with clinical judgement    Extremity/Trunk Assessment Upper Extremity Assessment Upper Extremity Assessment: Generalized weakness   Lower Extremity Assessment Lower Extremity Assessment: Generalized weakness         Cognition Arousal: Alert Behavior During Therapy: WFL for tasks assessed/performed Overall Cognitive Status: Within Functional Limits for tasks assessed                                                      Pertinent Vitals/ Pain       Pain Assessment Pain Assessment: Faces Faces Pain Scale: No hurt         Frequency  Min 1X/week        Progress Toward Goals  OT Goals(current goals can now be found in the care plan section)  Progress towards OT goals: Progressing toward goals  Acute Rehab OT Goals OT Goal Formulation: With patient Time For Goal Achievement: 05/08/23 Potential to Achieve Goals: Fair  Plan         AM-PAC OT "6 Clicks" Daily Activity     Outcome Measure   Help from another person eating meals?: None Help from another person taking care of personal grooming?: A Little Help from another person toileting, which includes using toliet, bedpan, or urinal?: Total Help from another person bathing (including washing, rinsing, drying)?: A Lot Help from another person to put on and taking off regular upper body clothing?: A Lot Help from another person to put on and taking off regular lower body clothing?: A Lot 6 Click Score: 14    End of Session    OT Visit Diagnosis: Other abnormalities of gait and mobility (R26.89);Muscle weakness (generalized) (M62.81);Pain   Activity Tolerance Patient limited by fatigue   Patient Left in bed;with call bell/phone within reach;with  bed alarm set   Nurse Communication Mobility status        Time: 2956-2130 OT Time Calculation (min): 12 min  Charges: OT General Charges $OT Visit: 1 Visit OT Treatments $Therapeutic Activity: 8-22 mins  Jackquline Denmark, MS, OTR/L , CBIS ascom [phone]  04/27/23, 4:25 PM

## 2023-04-27 NOTE — Progress Notes (Signed)
Patient ID: [name], female   DOB: 1946-08-24, 77 y.o.   MRN: [id number]     SURGICAL PROGRESS NOTE   Hospital Day(s): 9.   Interval History: Patient seen and examined, no acute events or new complaints overnight. Patient reports feeling well.  She denies any complaint.  She endorses she had a bowel movement yesterday.  She denies any nausea or vomiting this morning.  She has not eaten breakfast yet.  As per patient she ate yesterday.  As per nurse this is questionable.  Vital signs in last 24 hours: [min-max] current  Temp:  [98 F (36.7 C)-99.5 F (37.5 C)] 98 F (36.7 C) (08/19 0738) Pulse Rate:  [50-99] 50 (08/19 0738) Resp:  [16-18] 18 (08/19 0738) BP: (101-135)/(66-69) 135/67 (08/19 0738) SpO2:  [95 %-98 %] 95 % (08/19 0738)     Height: 5\' 9"  (175.3 cm) Weight: 95.3 kg BMI (Calculated): 31   Physical Exam:  Constitutional: alert, cooperative and no distress  Respiratory: breathing non-labored at rest  Cardiovascular: regular rate and sinus rhythm  Gastrointestinal: soft, non-tender, and non-distended.  The wound is dry and clean  Labs:     Latest Ref Rng & Units 04/26/2023    7:41 AM 04/24/2023    4:12 AM 04/22/2023    3:58 AM  CBC  WBC 4.0 - 10.5 K/uL 7.6  7.4  7.5   Hemoglobin 12.0 - 15.0 g/dL 40.9  81.1  91.4   Hematocrit 36.0 - 46.0 % 36.3  33.3  33.5   Platelets 150 - 400 K/uL 244  227  194       Latest Ref Rng & Units 04/26/2023    7:41 AM 04/24/2023    4:12 AM 04/23/2023    8:17 AM  CMP  Glucose 70 - 99 mg/dL 83  782  956   BUN 8 - 23 mg/dL 21  17  18    Creatinine 0.44 - 1.00 mg/dL 2.13  0.86  5.78   Sodium 135 - 145 mmol/L 139  141  139   Potassium 3.5 - 5.1 mmol/L 3.8  3.4  3.6   Chloride 98 - 111 mmol/L 107  108  106   CO2 22 - 32 mmol/L 26  26  27    Calcium 8.9 - 10.3 mg/dL 7.8  7.7  7.7   Total Protein 6.5 - 8.1 g/dL 5.4   5.4   Total Bilirubin 0.3 - 1.2 mg/dL 0.2   0.2   Alkaline Phos 38 - 126 U/L 35   35   AST 15 - 41 U/L 9   10   ALT 0 - 44  U/L 8   8     Imaging studies: No new pertinent imaging studies   Assessment/Plan:  77 y.o. female with small bowel obstruction s/p lysis of adhesions postop day #7, complicated by pertinent comorbidities including chronic kidney disease, DVT on anticoagulation, metastatic breast cancer to brain CHF.    Small bowel obstruction -Did not respond to conservative management.  Patient underwent lysis of additions -Incidental lymph node identified and biopsy taken.  Final result shows extra-adrenal paraganglioma -Resolving small bowel obstruction.  Advance diet to soft diet. -She had a good bowel movement yesterday.  No nausea or vomiting this morning.  Will follow-up oral intake today. -She seems to be at her baseline. -If she has adequate food intake, patient will be safe for discharge, hopefully in the next 24 hours.  Gae Gallop, MD

## 2023-04-27 NOTE — Care Management Important Message (Signed)
Important Message  Patient Details  Name: Duanne Kover MRN: [id number] Date of Birth: 04/22/1946   Medicare Important Message Given:  Yes  I reviewed the Important Message from Medicare again with the patient by phone [phone]) as she is in an isolation room. She is in agreement with her possible discharge on Tuesday. I wished her a speedy recovery and thanked her for her time.    Olegario Messier A Auria Mckinlay 04/27/2023, 12:19 PM

## 2023-04-28 ENCOUNTER — Ambulatory Visit: Payer: Medicare Other

## 2023-04-28 DIAGNOSIS — K56609 Unspecified intestinal obstruction, unspecified as to partial versus complete obstruction: Secondary | ICD-10-CM | POA: Diagnosis not present

## 2023-04-28 DIAGNOSIS — C50911 Malignant neoplasm of unspecified site of right female breast: Secondary | ICD-10-CM | POA: Diagnosis not present

## 2023-04-28 DIAGNOSIS — I5032 Chronic diastolic (congestive) heart failure: Secondary | ICD-10-CM | POA: Diagnosis not present

## 2023-04-28 DIAGNOSIS — D496 Neoplasm of unspecified behavior of brain: Secondary | ICD-10-CM | POA: Diagnosis not present

## 2023-04-28 LAB — BASIC METABOLIC PANEL
Anion gap: 4 — ABNORMAL LOW (ref 5–15)
BUN: 19 mg/dL (ref 8–23)
CO2: 27 mmol/L (ref 22–32)
Calcium: 8 mg/dL — ABNORMAL LOW (ref 8.9–10.3)
Chloride: 108 mmol/L (ref 98–111)
Creatinine, Ser: 0.64 mg/dL (ref 0.44–1.00)
GFR, Estimated: >60 mL/min (ref >60)
Glucose, Bld: 79 mg/dL (ref 70–99)
Potassium: 4.2 mmol/L (ref 3.5–5.1)
Sodium: 139 mmol/L (ref 135–145)

## 2023-04-28 LAB — HEMOGLOBIN: Hemoglobin: 11.7 g/dL — ABNORMAL LOW (ref 12.0–15.0)

## 2023-04-28 LAB — GLUCOSE, CAPILLARY
Glucose-Capillary: 57 mg/dL — ABNORMAL LOW (ref 70–99)
Glucose-Capillary: 76 mg/dL (ref 70–99)

## 2023-04-28 MED ORDER — HYDROCODONE-ACETAMINOPHEN 5-325 MG PO TABS: 1.0000 | ORAL_TABLET | Freq: Four times a day (QID) | ORAL | 0 refills | Status: DC | PRN

## 2023-04-28 MED ORDER — ALUM & MAG HYDROXIDE-SIMETH 200-200-20 MG/5ML PO SUSP
30.0000 mL | Freq: Once | ORAL | Status: AC
  Administered 2023-04-28: 30 mL via ORAL
  Filled 2023-04-28: qty 30

## 2023-04-28 MED ORDER — ENSURE ENLIVE PO LIQD: 237.0000 mL | ORAL | 0 refills | Status: DC

## 2023-04-28 MED ORDER — PANTOPRAZOLE SODIUM 40 MG PO TBEC: 40.0000 mg | DELAYED_RELEASE_TABLET | Freq: Two times a day (BID) | ORAL | 0 refills | Status: DC

## 2023-04-28 MED ORDER — ACETAMINOPHEN 325 MG PO TABS: 650.0000 mg | ORAL_TABLET | Freq: Four times a day (QID) | ORAL | Status: DC | PRN

## 2023-04-28 NOTE — Progress Notes (Signed)
Patient ID: [name], female   DOB: April 10, 1946, 77 y.o.   MRN: [id number]     SURGICAL PROGRESS NOTE   Hospital Day(s): 10.   Interval History: Patient seen and examined, no acute events or new complaints overnight. Patient reports feeling "pretty good".  Patient was sleepy but was easily aroused.  She denies abdominal pain.  She endorses having bowel movement.  She endorses passing gas.  She denies any nausea or vomiting.  She endorses eating her meals.  As per discussion with night nurse, there was no specific concern of eating her meals.  She did eat a little bit of peanut butter and crackers for snack at night.  Vital signs in last 24 hours: [min-max] current  Temp:  [98 F (36.7 C)-98.2 F (36.8 C)] 98.2 F (36.8 C) (08/20 0450) Pulse Rate:  [48-73] 48 (08/20 0450) Resp:  [14-18] 16 (08/20 0450) BP: (107-140)/(60-67) 140/66 (08/20 0450) SpO2:  [95 %-97 %] 97 % (08/20 0450)     Height: 5\' 9"  (175.3 cm) Weight: 95.3 kg BMI (Calculated): 31   Physical Exam:  Constitutional: alert, cooperative and no distress  Respiratory: breathing non-labored at rest  Cardiovascular: regular rate and sinus rhythm  Gastrointestinal: soft, non-tender, and non-distended.  The wounds are dry and clean  Labs:     Latest Ref Rng & Units 04/28/2023    4:54 AM 04/26/2023    7:41 AM 04/24/2023    4:12 AM  CBC  WBC 4.0 - 10.5 K/uL  7.6  7.4   Hemoglobin 12.0 - 15.0 g/dL 40.9  81.1  91.4   Hematocrit 36.0 - 46.0 %  36.3  33.3   Platelets 150 - 400 K/uL  244  227       Latest Ref Rng & Units 04/26/2023    7:41 AM 04/24/2023    4:12 AM 04/23/2023    8:17 AM  CMP  Glucose 70 - 99 mg/dL 83  782  956   BUN 8 - 23 mg/dL 21  17  18    Creatinine 0.44 - 1.00 mg/dL 2.13  0.86  5.78   Sodium 135 - 145 mmol/L 139  141  139   Potassium 3.5 - 5.1 mmol/L 3.8  3.4  3.6   Chloride 98 - 111 mmol/L 107  108  106   CO2 22 - 32 mmol/L 26  26  27    Calcium 8.9 - 10.3 mg/dL 7.8  7.7  7.7   Total Protein 6.5 - 8.1  g/dL 5.4   5.4   Total Bilirubin 0.3 - 1.2 mg/dL 0.2   0.2   Alkaline Phos 38 - 126 U/L 35   35   AST 15 - 41 U/L 9   10   ALT 0 - 44 U/L 8   8     Imaging studies: No new pertinent imaging studies   Assessment/Plan:  77 y.o. female with small bowel obstruction s/p lysis of adhesions postop day #7, complicated by pertinent comorbidities including chronic kidney disease, DVT on anticoagulation, metastatic breast cancer to brain CHF.    Small bowel obstruction -Did not respond to conservative management.  Patient underwent lysis of additions -Incidental lymph node identified and biopsy taken.  Final result shows extra-adrenal paraganglioma -Resolved.  Patient continued having bowel movement.  Patient tolerating diet. -The wound is healing well. -No contraindication to discharge from my standpoint.  I will follow-up patient in 1 week.  Gae Gallop, MD

## 2023-04-28 NOTE — Discharge Summary (Signed)
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  CT abd/pelvis showed high-grade small-bowel obstruction with transition point within the right mid abdomen. 8/12.  Patient taken to the operating room for small bowel obstruction and had exploratory laparotomy and extensive enterolysis.  Patient had a biopsy of the mesenteric lymph node. 8/14.  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  Patient has not passed gas yet.  Still with NG tube and n.p.o. 8/15.  Patient still has not passed gas or had a bowel movement.  Still with NG tube and n.p.o. 8/16.  Patient had bowel movements after Gastrografin put down NG tube.  Today NG tube was removed and started on clear liquid diet. 8/17.  Patient started on full liquid diet.  Added boost and Ensure. 8/18.  Patient advance to solid food.  Notified by nursing staff that she vomited 1 time.  No further vomiting but did not eat very well today. 8/19.  Continue advance diet.  Patient had a bowel movement yesterday.  Watching intake today and potential discharge tomorrow. 8/20.  Patient cleared by general surgery for discharge and follow up as outpatient.  Assessment and Plan: * SBO (small bowel obstruction) (HCC) Patient brought to the operating room on 8/12 by Dr. Hazle Quant.  Exploratory laparotomy and extensive enterolysis was done.  Patient advance to solid food on 8/18.  Vomited on 8/18.  Patient seen by surgery and cleared for discharge today.  Chronic diastolic CHF (congestive heart failure) (HCC) Last EF 50%.  No current signs of heart failure  Neoplasm of brain causing mass 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 structures [facility]) Breast cancer with metastasis to the brain.  Follow-up oncology as outpatient.  Coffee ground emesis Secondary to nausea vomiting earlier in course.  Hemoglobin stable..  On Protonix.  Cancer of right breast metastatic to brain [facility]) Continue Arimidex.  DVT (deep venous thrombosis) (HCC) Resume eliquis as outpatient  Stage 3a chronic kidney disease (HCC) Last creatinine 0.66  with a GFR greater than 60.  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  Acute postoperative anemia due to expected blood loss Last hemoglobin up to 11.2  Hypernatremia Sodium normal range.  Aspiration pneumonia of right lower lobe  due to vomit (HCC) Completed Rocephin.  Hypoglycemia Relative low sugar yesterday morning.   Peripheral neuropathy Continue Lyrica  Obesity (BMI 30-39.9) BMI 31.01         Consultants: general surgery Procedures performed: exploratory laparotomy to lyse adhesions Disposition: Long term care Diet recommendation:  Soft diet DISCHARGE MEDICATION: Allergies as of 04/28/2023   No Known Allergies      Medication List     STOP taking these medications    bisacodyl 5 MG EC tablet Commonly known as: DULCOLAX   ferrous sulfate 325 (65 FE) MG tablet   levETIRAcetam 500 MG tablet Commonly known as: KEPPRA   omeprazole 40 MG capsule Commonly known as: PRILOSEC   traMADol HCl 25 MG Tabs       TAKE these medications    acetaminophen 325 MG tablet Commonly known as: TYLENOL Take 2 tablets (650 mg total) by mouth every 6 (six) hours as needed for mild pain (or Fever >/= 101). What changed: Another medication with the same name was added. Make sure you understand how and when to take each.   acetaminophen 325 MG tablet Commonly known as: TYLENOL Take 2 tablets (650 mg total) by mouth every 6 (six) hours as needed for mild pain (or Fever >/= 101). What changed: You were already taking a medication with the same name, and this prescription was added. Make sure you understand how and when to take each.   anastrozole 1 MG tablet Commonly known as: ARIMIDEX Take 1 tablet (1 mg total) by mouth daily.   ascorbic acid 500 MG tablet Commonly known as: VITAMIN C Take 1 tablet by mouth 2 (two) times daily.   Bladder Control Pads Ex Absorb Misc Apply 1 Dose topically as needed.   Calcium Carbonate-Vitamin D 600-5 MG-MCG Tabs Take 1 tablet by mouth 2 (two) times daily.   dexamethasone 4 MG tablet Commonly known as: DECADRON Take 1 tablet (4 mg total) by mouth 2 (two) times daily with a meal.   diclofenac Sodium 1 % Gel Commonly known as: VOLTAREN Apply 2 g topically  2 (two) times daily as needed.   Eliquis 5 MG Tabs tablet Generic drug: apixaban Take 5 mg by mouth 2 (two) times daily.   empagliflozin 10 MG Tabs tablet Commonly known as: Jardiance Take 1 tablet (10 mg total) by mouth daily before breakfast.   eucerin lotion Apply topically 2 (two) times daily. Apply to dry skin especially bilateral lower extremities and arms.   feeding supplement Liqd Take 237 mLs by mouth daily.   guaiFENesin 600 MG 12 hr tablet Commonly known as: MUCINEX Take 600 mg by mouth every 12 (twelve) hours.   HYDROcodone-acetaminophen 5-325 MG tablet Commonly known as: NORCO/VICODIN Take 1 tablet by mouth every 6 (six) hours as needed. What changed: when to take this   ipratropium-albuterol 0.5-2.5 (3) MG/3ML Soln Commonly known as: DUONEB Take 3 mLs by nebulization every 4 (four) hours as needed (wheezing).   isosorbide mononitrate 60 MG 24 hr tablet Commonly known as: Imdur Take 1 tablet (60 mg total) by mouth 2 (two) times daily.   loratadine 10 MG tablet Commonly known as: CLARITIN Take 10 mg by mouth daily.   losartan 50 MG  tablet Commonly known as: COZAAR Take 50 mg by mouth daily.   ondansetron 4 MG disintegrating tablet Commonly known as: ZOFRAN-ODT Take 4 mg by mouth every 8 (eight) hours as needed.   oxybutynin 5 MG tablet Commonly known as: DITROPAN Take 5 mg by mouth 2 (two) times daily.   pantoprazole 40 MG tablet Commonly known as: PROTONIX Take 1 tablet (40 mg total) by mouth 2 (two) times daily.   polyethylene glycol 17 g packet Commonly known as: MIRALAX / GLYCOLAX Take 17 g by mouth daily. Skip the dose if no constipation   pregabalin 75 MG capsule Commonly known as: LYRICA Take 75 mg by mouth daily.   sennosides-docusate sodium 8.6-50 MG tablet Commonly known as: SENOKOT-S Take 1 tablet by mouth 2 (two) times daily.   spironolactone 25 MG tablet Commonly known as: ALDACTONE Take 12.5 mg by mouth daily.   vitamin  B-12 500 MCG tablet Commonly known as: CYANOCOBALAMIN Take 500 mcg by mouth daily.   Vitamin D (Ergocalciferol) 1.25 MG (50000 UNIT) Caps capsule Commonly known as: DRISDOL Take 50,000 Units by mouth once a week.   Zinc Oxide 12.8 % ointment Commonly known as: TRIPLE PASTE Apply barrier cream to buttocks every shift.        Follow-up Information     Carolan Shiver, MD Follow up in 1 week(s).   Specialty: General Surgery Why: Follow up after surgery for small bowel obstruction Contact information: [address] [place] [address] [phone]         Creig Hines, MD Follow up in 2 week(s).   Specialty: Oncology Contact information: [address] [place] [address] [phone]         doctor at facility Follow up in 1 day(s).                 Discharge Exam: Filed Weights   04/18/23 0031 04/20/23 1248  Weight: 95.3 kg 95.3 kg   Physical Exam HENT:     Head: Normocephalic.     Mouth/Throat:     Pharynx: No oropharyngeal exudate.  Eyes:     General: Lids are normal.     Conjunctiva/sclera: Conjunctivae normal.  Cardiovascular:     Rate and Rhythm: Normal rate and regular rhythm.     Heart sounds: Normal heart sounds, S1 normal and S2 normal.  Pulmonary:     Breath sounds: No decreased breath sounds, wheezing or rales.  Abdominal:     Palpations: Abdomen is soft.     Tenderness: There is generalized abdominal tenderness.  Musculoskeletal:     Right lower leg: Swelling present.     Left lower leg: Swelling present.  Skin:    General: Skin is warm.     Findings: No rash.  Neurological:     Mental Status: She is alert.     Comments: Unable to straight leg raise.      Condition at discharge: fair  The results of significant diagnostics from this hospitalization (including imaging, microbiology, ancillary and laboratory) are listed below for reference.   Imaging Studies: DG Abd 2 Views  Result Date:  04/24/2023 CLINICAL DATA:  Small bowel obstruction EXAM: ABDOMEN - 2 VIEW COMPARISON:  Yesterday FINDINGS: Enteric contrast diffusely outlines the colon and reaches the distal colon. A few colonic diverticular noted. No change in gaseous distension of small bowel. An enteric tube tip and side-port overlaps the stomach. No concerning mass effect or gas collection. IMPRESSION: Enteric contrast reaches the nondilated colon.  No adverse change from prior. Electronically Signed   By: Tiburcio Pea M.D.   On: 04/24/2023 07:32   DG Abd Portable 1V-Small Bowel Obstruction Protocol-initial, 8 hr delay  Result Date: 04/23/2023 CLINICAL DATA:  Small-bowel obstruction, 8 hour delay EXAM: PORTABLE ABDOMEN - 1 VIEW COMPARISON:  04/20/2023 FINDINGS: Esophagogastric tube with tip and side port below the diaphragm. Distended, contrast filled loops of small bowel in the left hemiabdomen measuring up to 4.4 cm in caliber. Decompressed loops of distal small bowel in the right hemiabdomen. No clearly visible contrast opacification of the colon. Midline laparotomy. IVC filter. IMPRESSION: Distended, contrast filled loops of small bowel in the left hemiabdomen measuring up to 4.4 cm in caliber. Decompressed loops of distal small bowel in the right hemiabdomen. No clearly visible contrast opacification of the colon. Electronically Signed   By: Jearld Lesch M.D.   On: 04/23/2023 19:06   DG Abd 2 Views  Result Date: 04/20/2023 CLINICAL DATA:  Small bowel obstruction. EXAM: ABDOMEN - 2 VIEW COMPARISON:  04/19/2023. FINDINGS: Increase in gaseous small bowel distention of the upper abdomen with decrease in gaseous distention of right colon and hepatic flexure. Contrast material remains over the pelvis in appears to be within small bowel. No definite colonic contrast although assessment is limited by patient body habitus. NG tube tip is again noted in the stomach. IMPRESSION: 1. Increase in gaseous small bowel distention of the upper  abdomen with decrease in gaseous distention of right colon and hepatic flexure. 2. Contrast material remains over the pelvis and appears to be within small bowel. No definite colonic contrast although assessment is limited by patient body habitus. Electronically Signed   By: Kennith Center M.D.   On: 04/20/2023 06:43   DG Abd 2 Views  Result Date: 04/19/2023 CLINICAL DATA:  Small-bowel obstruction EXAM: ABDOMEN - 2 VIEW COMPARISON:  04/18/2023 FINDINGS: Gastric catheter is again seen within the stomach. Scattered dilated loops of small bowel are again seen but slightly improved. Administered contrast remains primarily within the small bowel. No definitive colonic contrast is noted at this time. IMPRESSION: Persistent small bowel obstruction although slightly improved. Administered contrast lies within the small bowel. No discrete colonic contrast is noted. Electronically Signed   By: Alcide Clever M.D.   On: 04/19/2023 19:02   DG Chest Port 1 View  Result Date: 04/19/2023 CLINICAL DATA:  Fever. EXAM: PORTABLE CHEST 1 VIEW COMPARISON:  09/09/2022 FINDINGS: The heart is enlarged. Right lower lobe airspace disease is new with further volume loss at the right base. Minimal atelectasis is present at the left base. Mild pulmonary vascular congestion is present. Gastric tube terminates in the fundus of the stomach. IMPRESSION: 1. New right lower lobe airspace disease is consistent with pneumonia. 2. Cardiomegaly and mild pulmonary vascular congestion. 3. Gastric tube terminates in the fundus of the stomach. Electronically Signed   By: Marin Roberts M.D.   On: 04/19/2023 13:38   DG Abd Portable 1V-Small Bowel Obstruction Protocol-initial, 8 hr delay  Result Date: 04/18/2023 CLINICAL DATA:  8 hour small-bowel follow-up film EXAM: PORTABLE ABDOMEN - 1 VIEW COMPARISON:  Film from earlier in the same day. FINDINGS: Gastric catheter is again seen in the stomach. Administered contrast now lies within the dilated  small bowel. No colonic contrast is seen at this time. No free air is noted. IMPRESSION: Contrast within dilated loops of small bowel. Continued follow-up at 24 hours is recommended. Electronically Signed   By: Eulah Pont.D.  On: 04/18/2023 22:29   DG Abd Portable 1V-Small Bowel Protocol-Position Verification  Result Date: 04/18/2023 CLINICAL DATA:  [id number] Encounter for imaging study to confirm nasogastric (NG) tube placement [id number] EXAM: PORTABLE ABDOMEN - 1 VIEW COMPARISON:  Earlier films of the same day FINDINGS: Gastric tube has been advanced into the stomach which is nondistended. Gas dilated small bowel loops in the visualized upper abdomen. Retrievable IVC filter noted. Lower abdomen excluded. IMPRESSION: Gastric tube into stomach. Electronically Signed   By: Corlis Leak M.D.   On: 04/18/2023 11:47   NM Bone Scan Whole Body  Result Date: 04/18/2023 CLINICAL DATA:  History of breast carcinoma, new brain lesion suggesting metastatic disease EXAM: NUCLEAR MEDICINE WHOLE BODY BONE SCAN TECHNIQUE: Whole body anterior and posterior images were obtained approximately 3 hours after intravenous injection of radiopharmaceutical. RADIOPHARMACEUTICALS:  23.57 mCi Technetium-68m MDP IV COMPARISON:  PET-CT 07/16/2022 FINDINGS: Mildly increased activity in bilateral shoulders, knees, and ankles characteristic of DJD. Otherwise physiologic distribution of radiopharmaceutical. IMPRESSION: No scintigraphic evidence of osseous metastatic disease. Electronically Signed   By: Corlis Leak M.D.   On: 04/18/2023 11:46   DG Abd 2 Views  Result Date: 04/18/2023 CLINICAL DATA:  [id number] SBO (small bowel obstruction) (HCC) [id number] EXAM: ABDOMEN - 2 VIEW COMPARISON:  CT from the same day FINDINGS: Visualized lung bases clear. No free air. Stomach is nondilated. Multiple gas distended mid abdominal small bowel loops, some dilated up to 5.1 cm. The colon is nondistended. Retrievable IVC filter. Laparoscopic hernia repair  sutures right pelvis. Spondylitic changes in the lower lumbar spine. IMPRESSION: Persistent small bowel dilatation. Electronically Signed   By: Corlis Leak M.D.   On: 04/18/2023 11:44   CT ANGIO GI BLEED  Result Date: 04/18/2023 CLINICAL DATA:  hematemesis, abd pain; assess for GI bleed and obstruction/infection EXAM: CTA ABDOMEN AND PELVIS WITHOUT AND WITH CONTRAST TECHNIQUE: Multidetector CT imaging of the abdomen and pelvis was performed using the standard protocol during bolus administration of intravenous contrast. Multiplanar reconstructed images and MIPs were obtained and reviewed to evaluate the vascular anatomy. RADIATION DOSE REDUCTION: This exam was performed according to the departmental dose-optimization program which includes automated exposure control, adjustment of the mA and/or kV according to patient size and/or use of iterative reconstruction technique. CONTRAST:  OMNIPAQUE IOHEXOL 350 MG/ML SOLN COMPARISON:  CT abdomen pelvis 05/28/2022 FINDINGS: VASCULAR Aorta: Mild to moderate atherosclerotic plaque. Normal caliber aorta without aneurysm, dissection, vasculitis or significant stenosis. Celiac: At least mild atherosclerotic plaque patent without evidence of aneurysm, dissection, vasculitis or significant stenosis. SMA: Mild to moderate atherosclerotic plaque. Patent without evidence of aneurysm, dissection, vasculitis or significant stenosis. Renals: Mild atherosclerotic plaque. Both renal arteries are patent without evidence of aneurysm, dissection, vasculitis, fibromuscular dysplasia or significant stenosis. IMA: Moderate atherosclerotic plaque. Patent without evidence of aneurysm, dissection, vasculitis or significant stenosis. Inflow: Patent without evidence of aneurysm, dissection, vasculitis or significant stenosis. Proximal Outflow: Bilateral common femoral and visualized portions of the superficial and profunda femoral arteries are patent without evidence of aneurysm,  dissection, vasculitis or significant stenosis. Veins: The portal or superior mesenteric veins are patent. Splenic artery not visualized and likely thrombosed. Inferior vena cava filter in appropriate position. Review of the MIP images confirms the above findings. NON-VASCULAR Lower chest: Partially visualized likely enlarged heart. Hepatobiliary: No focal liver abnormality. No gallstones, gallbladder wall thickening, or pericholecystic fluid. No biliary dilatation. Pancreas: No focal lesion. Normal pancreatic contour. No surrounding inflammatory changes. No main pancreatic ductal dilatation. Spleen: Normal in  size without focal abnormality. Adrenals/Urinary Tract: No adrenal nodule bilaterally. Bilateral kidneys enhance symmetrically. No hydronephrosis. No hydroureter. Fluid density lesion within the right kidney likely represents a simple renal cyst. Simple renal cysts, in the absence of clinically indicated signs/symptoms, require no independent follow-up. The urinary bladder is decompressed and grossly unremarkable. Stomach/Bowel: Stomach is within normal limits. No evidence of large bowel wall thickening or dilatation. Multiple loops of small bowel within the mid abdomen dilated with fluid. No pneumatosis. Transition point noted within the right mid abdomen. Small bowel distally is collapsed. Appendix appears normal. Lymphatic: No lymphadenopathy. Reproductive: Lobulated uterine contour with calcified lesion suggestive of a uterine fibroid. No adnexal mass. Other: Small volume simple free fluid ascites. No intraperitoneal free gas. No organized fluid collection. Musculoskeletal: Infraumbilical ventral wall hernia repair with mesh. Tiny fat containing umbilical hernia superiorly. No suspicious lytic or blastic osseous lesions. No acute displaced fracture. Grade 1 anterolisthesis of L4 on L5. Severe degenerative changes of the right hip. IMPRESSION: VASCULAR 1. No CT evidence of acute gastrointestinal hemorrhage.  2. No acute aortic abnormality. 3. Question splenic vein thrombosis. 4. Aortic Atherosclerosis (ICD10-I70.0). NON-VASCULAR 1. High-grade small-bowel obstruction with transition point within the right mid abdomen. Recommend surgical consultation. 2. Small volume simple free fluid ascites. 3. Uterine fibroids. 4. Infraumbilical ventral wall hernia repair. Electronically Signed   By: Tish Frederickson M.D.   On: 04/18/2023 02:17   CT Head Wo Contrast  Result Date: 04/18/2023 CLINICAL DATA:  Altered mental status EXAM: CT HEAD WITHOUT CONTRAST TECHNIQUE: Contiguous axial images were obtained from the base of the skull through the vertex without intravenous contrast. RADIATION DOSE REDUCTION: This exam was performed according to the departmental dose-optimization program which includes automated exposure control, adjustment of the mA and/or kV according to patient size and/or use of iterative reconstruction technique. COMPARISON:  Brain MRI 03/29/2023 FINDINGS: Brain: There is no hemorrhage or extra-axial collection. The size and configuration of the ventricles and extra-axial CSF spaces are normal. The brain parenchyma is normal, without acute or chronic infarction. The mass of the right cingulate gyrus demonstrated on the recent MRI is not visible on this study. Vascular: No abnormal hyperdensity of the major intracranial arteries or dural venous sinuses. No intracranial atherosclerosis. Skull: The visualized skull base, calvarium and extracranial soft tissues are normal. Sinuses/Orbits: No fluid levels or advanced mucosal thickening of the visualized paranasal sinuses. No mastoid or middle ear effusion. The orbits are normal. IMPRESSION: 1. No acute intracranial abnormality. 2. The mass of the right cingulate gyrus demonstrated on the recent MRI is not visible on this study. Electronically Signed   By: Deatra Robinson M.D.   On: 04/18/2023 02:07   PERIPHERAL VASCULAR CATHETERIZATION  Result Date: 03/30/2023 See  surgical note for result.  CT CHEST ABDOMEN PELVIS WO CONTRAST  Result Date: 03/30/2023 CLINICAL DATA:  Generalized weakness and lethargy. Evaluate for metastatic disease. EXAM: CT CHEST, ABDOMEN AND PELVIS WITHOUT CONTRAST TECHNIQUE: Multidetector CT imaging of the chest, abdomen and pelvis was performed following the standard protocol without IV contrast. RADIATION DOSE REDUCTION: This exam was performed according to the departmental dose-optimization program which includes automated exposure control, adjustment of the mA and/or kV according to patient size and/or use of iterative reconstruction technique. COMPARISON:  CT abdomen pelvis dated 09/11/2022. FINDINGS: Evaluation of this exam is limited in the absence of intravenous contrast. CT CHEST FINDINGS Cardiovascular: There is mild cardiomegaly. No pericardial effusion. There is coronary vascular calcification. Mild atherosclerotic calcification of the thoracic aorta.  No aneurysmal dilatation. Mild dilatation of the main pulmonary trunk suggestive of pulmonary hypertension. Clinical correlation is recommended. Mediastinum/Nodes: No hilar or mediastinal adenopathy. The esophagus is grossly unremarkable. Multiple bilateral thyroid nodules measure up to 2 cm on the right. This has been evaluated on previous imaging. (ref: J Am Coll Radiol. 2015 Feb;12(2): 143-50).No mediastinal fluid collection. Lungs/Pleura: No focal consolidation, pleural effusion, or pneumothorax. The central airways are patent. Musculoskeletal: Degenerative changes of the spine. No acute osseous pathology. CT ABDOMEN PELVIS FINDINGS No intra-abdominal free air or free fluid. Hepatobiliary: Subcentimeter hypodense lesion in the right lobe of the liver is too small to characterize but demonstrates fluid attenuation and similar to prior CT, most consistent with a cyst. The liver is otherwise unremarkable. No biliary dilatation. The gallbladder is unremarkable. Pancreas: Unremarkable. No  pancreatic ductal dilatation or surrounding inflammatory changes. Spleen: Normal in size without focal abnormality. Adrenals/Urinary Tract: The adrenal glands are unremarkable. There is a 4.5 cm right renal upper pole cyst. A 2 point cm high attenuating/solid-appearing lesion from the lateral mid to lower pole of the right kidney is not characterized on this CT. Further characterization with MRI without and with contrast on a nonemergent/outpatient basis recommended. There is no hydronephrosis or nephrolithiasis on either side. The urinary bladder is grossly unremarkable. Stomach/Bowel: There is no bowel obstruction or active inflammation. The appendix is not visualized with certainty. No inflammatory changes identified in the right lower quadrant. Vascular/Lymphatic: Mild aortoiliac atherosclerotic disease. The IVC is unremarkable. No portal venous gas. There is no adenopathy. Reproductive: Myomatous uterus.  No adnexal masses. Other: Midline anterior pelvic wall hernia repair. There is a small fat containing umbilical hernia. Musculoskeletal: Degenerative changes of the spine and right hip. No acute osseous pathology. IMPRESSION: 1. No acute intrathoracic, abdominal, or pelvic pathology. No evidence of metastatic disease. 2. Indeterminate 2 cm lesion from the lateral mid to lower pole of the right kidney. Further characterization with MRI without and with contrast on a nonemergent/outpatient basis recommended. 3. Aortic Atherosclerosis (ICD10-I70.0). Electronically Signed   By: Elgie Collard M.D.   On: 03/30/2023 02:11   US Venous Img Lower Bilateral (DVT)  Result Date: 03/30/2023 CLINICAL DATA:  Bilateral lower extremity pain and swelling, history of deep venous thrombosis. EXAM: BILATERAL LOWER EXTREMITY VENOUS DOPPLER ULTRASOUND TECHNIQUE: Gray-scale sonography with graded compression, as well as color Doppler and duplex ultrasound were performed to evaluate the lower extremity deep venous systems from  the level of the common femoral vein and including the common femoral, femoral, profunda femoral, popliteal and calf veins including the posterior tibial, peroneal and gastrocnemius veins when visible. The superficial great saphenous vein was also interrogated. Spectral Doppler was utilized to evaluate flow at rest and with distal augmentation maneuvers in the common femoral, femoral and popliteal veins. COMPARISON:  None Available. FINDINGS: RIGHT LOWER EXTREMITY Common Femoral Vein: No evidence of thrombus. Normal compressibility, respiratory phasicity and response to augmentation. Saphenofemoral Junction: No evidence of thrombus. Normal compressibility and flow on color Doppler imaging. Profunda Femoral Vein: No evidence of thrombus. Normal compressibility and flow on color Doppler imaging. Femoral Vein: No evidence of thrombus. Normal compressibility, respiratory phasicity and response to augmentation. Popliteal Vein: No evidence of thrombus. Normal compressibility, respiratory phasicity and response to augmentation. Calf Veins: Thrombus is noted in the posterior tibial vein. The peroneal vein is not well visualized. Superficial Great Saphenous Vein: No evidence of thrombus. Normal compressibility. Venous Reflux:  None. Other Findings:  None. LEFT LOWER EXTREMITY Common Femoral Vein: No  evidence of thrombus. Normal compressibility, respiratory phasicity and response to augmentation. Saphenofemoral Junction: No evidence of thrombus. Normal compressibility and flow on color Doppler imaging. Profunda Femoral Vein: No evidence of thrombus. Normal compressibility and flow on color Doppler imaging. Femoral Vein: No evidence of thrombus. Normal compressibility, respiratory phasicity and response to augmentation. Popliteal Vein: No evidence of thrombus. Normal compressibility, respiratory phasicity and response to augmentation. Calf Veins: Calf veins are not well visualized. Superficial Great Saphenous Vein: No evidence  of thrombus. Normal compressibility. Venous Reflux:  None. Other Findings:  None. IMPRESSION: Thrombus noted in the right posterior tibial vein. No other focal abnormality is noted. Electronically Signed   By: Alcide Clever M.D.   On: 03/30/2023 01:08   MR BRAIN W WO CONTRAST  Result Date: 03/29/2023 CLINICAL DATA:  right facial droop. EXAM: MRI HEAD WITHOUT AND WITH CONTRAST TECHNIQUE: Multiplanar, multiecho pulse sequences of the brain and surrounding structures were obtained without and with intravenous contrast. CONTRAST:  10mL GADAVIST GADOBUTROL 1 MMOL/ML IV SOLN COMPARISON:  Brain MRI 03/03/2022. FINDINGS: Brain: Peripherally enhancing mass with central nonenhancement along the gray-white junction of the right cingulate gyrus, measuring up to 13 mm (axial image 109 series 20) with surrounding vasogenic edema. No other foci of abnormal enhancement in the brain. No acute infarct or hemorrhage. Mild chronic small-vessel disease. No hydrocephalus or extra-axial collection. Vascular: Normal flow voids and vessel enhancement. Skull and upper cervical spine: Normal marrow signal and enhancement. Sinuses/Orbits: Unremarkable. Other: None. IMPRESSION: Peripherally enhancing mass with central nonenhancement along the gray-white junction of the right cingulate gyrus, measuring up to 13 mm with surrounding vasogenic edema. 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 Electronically Signed   By: [name] M.D.   On: 03/29/2023 18:11    Microbiology: Results for orders placed or performed during the hospital encounter of 04/18/23  Culture, blood (Routine X 2) w Reflex to ID Panel     Status: None   Collection Time: 04/19/23 10:22 AM   Specimen: BLOOD  Result Value Ref Range Status   Specimen Description BLOOD [facility]  Final   Special Requests   Final    BOTTLES DRAWN AEROBIC AND ANAEROBIC Blood Culture adequate volume   Culture   Final    NO GROWTH 5 DAYS Performed at [facility], [address] [place], [place] [address]    Report Status 04/24/2023 FINAL  Final  Culture, blood (Routine X 2) w Reflex to ID Panel     Status: None (Preliminary result)   Collection Time: 04/19/23 10:22 AM   Specimen: BLOOD LEFT HAND  Result Value Ref Range Status   Specimen Description   Final    BLOOD LEFT HAND Performed at [facility], [address], [place], [place] [address]    Special Requests   Final    BOTTLES DRAWN AEROBIC AND ANAEROBIC Blood Culture adequate volume Performed at [facility], [address] [place], [place] [address]    Culture  Setup Time   Final    GRAM POSITIVE RODS AEROBIC BOTTLE ONLY CRITICAL RESULT CALLED TO, READ BACK BY AND VERIFIED WITH: JASON ROBBINS PHARMD @0434  04/22/23 ASW Performed at [facility], [address] [place], [place] [address]    Culture   Final    Romie Minus POSITIVE RODS SENT TO Verdell Carmine FOR iDENTIFICATION Performed at [facility], [address] [place], [place] [address]    Report Status PENDING  Incomplete  Urine Culture (for pregnant, neutropenic  or urologic patients or patients with an indwelling urinary catheter)     Status: Abnormal   Collection Time: 04/19/23 12:12 PM   Specimen: Urine, Catheterized  Result Value Ref Range Status   Specimen Description   Final    URINE, CATHETERIZED Performed at [facility], [address], [place], [place] [address]    Special Requests   Final    NONE Performed at [facility], [address] [place], [place] [address]    Culture (A)  Final    >=100,000 COLONIES/mL PROVIDENCIA STUARTII 50,000 COLONIES/mL KLEBSIELLA PNEUMONIAE Confirmed Extended Spectrum Beta-Lactamase Producer (ESBL).  In bloodstream infections from ESBL organisms, carbapenems are preferred over piperacillin/tazobactam. They are shown to have a lower risk of mortality.    Report Status 04/22/2023 FINAL  Final   Organism ID, Bacteria PROVIDENCIA STUARTII (A)   Final   Organism ID, Bacteria KLEBSIELLA PNEUMONIAE (A)  Final      Susceptibility   Klebsiella pneumoniae - MIC*    AMPICILLIN >=32 RESISTANT Resistant     CEFAZOLIN >=64 RESISTANT Resistant     CEFEPIME >=32 RESISTANT Resistant     CEFTRIAXONE >=64 RESISTANT Resistant     CIPROFLOXACIN >=4 RESISTANT Resistant     GENTAMICIN >=16 RESISTANT Resistant     IMIPENEM <=0.25 SENSITIVE Sensitive     NITROFURANTOIN 128 RESISTANT Resistant     TRIMETH/SULFA >=320 RESISTANT Resistant     AMPICILLIN/SULBACTAM >=32 RESISTANT Resistant     PIP/TAZO 16 SENSITIVE Sensitive     * 50,000 COLONIES/mL KLEBSIELLA PNEUMONIAE   Providencia stuartii - MIC*    AMPICILLIN >=32 RESISTANT Resistant     CEFEPIME 0.25 SENSITIVE Sensitive     CEFTRIAXONE 2 INTERMEDIATE Intermediate     CIPROFLOXACIN 2 RESISTANT Resistant     GENTAMICIN 4 SENSITIVE Sensitive     IMIPENEM 2 SENSITIVE Sensitive     NITROFURANTOIN 256 RESISTANT Resistant     TRIMETH/SULFA 160 RESISTANT Resistant     AMPICILLIN/SULBACTAM >=32 RESISTANT Resistant     PIP/TAZO 8 SENSITIVE Sensitive     * >=100,000 COLONIES/mL PROVIDENCIA STUARTII  MRSA Next Gen by PCR, Nasal     Status: None   Collection Time: 04/22/23  2:00 PM   Specimen: Nasal Mucosa; Nasal Swab  Result Value Ref Range Status   MRSA by PCR Next Gen NOT DETECTED NOT DETECTED Final    Comment: (NOTE) The GeneXpert MRSA Assay (FDA approved for NASAL specimens only), is one component of a comprehensive MRSA colonization surveillance program. It is not intended to diagnose MRSA infection nor to guide or monitor treatment for MRSA infections. Test performance is not FDA approved in patients less than 5 years old. Performed at [facility], [address] [place], [place] [address]   Urine culture was a colonization and not infection  Labs: CBC: Recent Labs  Lab 04/22/23 0358 04/24/23 0412 04/26/23 0741 04/28/23 0454  WBC 7.5 7.4 7.6  --   HGB 10.3* 10.3*  11.2* 11.7*  HCT 33.5* 33.3* 36.3  --   MCV 87.7 86.3 86.6  --   PLT 194 227 244  --    Basic Metabolic Panel: Recent Labs  Lab 04/22/23 0358 04/23/23 0817 04/24/23 0412 04/26/23 0741 04/28/23 0809  NA 142 139 141 139 139  K 3.5 3.6 3.4* 3.8 4.2  CL 110 106 108 107 108  CO2 28 27 26 26 27   GLUCOSE 100* 103* 117* 83 79  BUN 25* 18 17 21 19   CREATININE  0.78 0.66 0.56 0.66 0.64  CALCIUM 7.4* 7.7* 7.7* 7.8* 8.0*  MG 2.5*  --   --   --   --    Liver Function Tests: Recent Labs  Lab 04/23/23 0817 04/26/23 0741  AST 10* 9*  ALT 8 8  ALKPHOS 35* 35*  BILITOT 0.2* 0.2*  PROT 5.4* 5.4*  ALBUMIN 2.3* 2.4*   CBG: Recent Labs  Lab 04/27/23 0739 04/27/23 0857 04/27/23 1138 04/27/23 1715 04/27/23 2106  GLUCAP 53* 59* 65* 148* 154*    Discharge time spent: greater than 30 minutes.  Signed: Alford Highland, MD [facility] 04/28/2023

## 2023-04-28 NOTE — Progress Notes (Signed)
Called report to [facility] LPN at [facility] 336 [phone]  going to room 502A

## 2023-04-28 NOTE — TOC Transition Note (Signed)
Transition of Care [facility]) - CM/SW Discharge Note   Patient Details  Name: Graycelyn Kinzler MRN: [id number] Date of Birth: [birth year]  Transition of [facility]) CM/SW Contact:  Allena Katz, LCSW Phone Number: 04/28/2023, 10:34 AM   Clinical Narrative:   [facility]. Tammy notified. DC summary sent with fl2. RN given number for report. CSW to call [facility]. Medical necessity printed to unit.           Patient Goals and CMS Choice      Discharge Placement                         Discharge Plan and Services Additional resources added to the After Visit Summary for                                       Social Determinants of Health (SDOH) Interventions SDOH Screenings   Food Insecurity: No Food Insecurity (04/18/2023)  Housing: Low Risk  (04/18/2023)  Transportation Needs: No Transportation Needs (04/18/2023)  Utilities: Not At Risk (04/18/2023)  Tobacco Use: Medium Risk (04/20/2023)     Readmission Risk Interventions    04/18/2023   10:48 AM 09/12/2022   11:09 AM  Readmission Risk Prevention Plan  Transportation Screening Complete   PCP or Specialist Appt within 3-5 Days  Complete  Social Work Consult for Recovery Care Planning/Counseling  Complete  Palliative Care Screening  Not Applicable  Medication Review [employer]) Complete   HRI or Home Care Consult Complete   Skilled Nursing Facility Complete

## 2023-04-29 ENCOUNTER — Ambulatory Visit: Payer: Medicare Other

## 2023-04-30 ENCOUNTER — Ambulatory Visit: Payer: Medicare Other

## 2023-04-30 ENCOUNTER — Ambulatory Visit
Admission: RE | Admit: 2023-04-30 | Discharge: 2023-04-30 | Disposition: A | Discharge status: Home / Self Care | Payer: Medicare Other | Source: Ambulatory Visit | Attending: Radiation Oncology | Admitting: Radiation Oncology

## 2023-04-30 ENCOUNTER — Other Ambulatory Visit: Payer: Self-pay

## 2023-04-30 DIAGNOSIS — C7931 Secondary malignant neoplasm of brain: Secondary | ICD-10-CM | POA: Diagnosis not present

## 2023-04-30 LAB — RAD ONC ARIA SESSION SUMMARY
Course Elapsed Days: 14
Plan Fractions Treated to Date: 3
Plan Prescribed Dose Per Fraction: 6 Gy
Plan Total Fractions Prescribed: 5
Plan Total Prescribed Dose: 30 Gy
Reference Point Dosage Given to Date: 18 Gy
Reference Point Session Dosage Given: 6 Gy
Session Number: 3

## 2023-05-01 ENCOUNTER — Ambulatory Visit: Admission: RE | Admit: 2023-05-01 | Payer: Medicare Other | Source: Ambulatory Visit

## 2023-05-01 ENCOUNTER — Other Ambulatory Visit: Payer: Self-pay

## 2023-05-01 DIAGNOSIS — C7931 Secondary malignant neoplasm of brain: Secondary | ICD-10-CM | POA: Diagnosis not present

## 2023-05-01 LAB — MISC LABCORP TEST (SEND OUT): Labcorp test code: 8664

## 2023-05-01 LAB — RAD ONC ARIA SESSION SUMMARY
Course Elapsed Days: 15
Plan Fractions Treated to Date: 4
Plan Prescribed Dose Per Fraction: 6 Gy
Plan Total Fractions Prescribed: 5
Plan Total Prescribed Dose: 30 Gy
Reference Point Dosage Given to Date: 24 Gy
Reference Point Session Dosage Given: 6 Gy
Session Number: 4

## 2023-05-04 ENCOUNTER — Other Ambulatory Visit: Payer: Self-pay

## 2023-05-04 ENCOUNTER — Ambulatory Visit: Admission: RE | Admit: 2023-05-04 | Payer: Medicare Other | Source: Ambulatory Visit

## 2023-05-04 DIAGNOSIS — C7931 Secondary malignant neoplasm of brain: Secondary | ICD-10-CM | POA: Diagnosis not present

## 2023-05-04 LAB — RAD ONC ARIA SESSION SUMMARY
Course Elapsed Days: 18
Plan Fractions Treated to Date: 5
Plan Prescribed Dose Per Fraction: 6 Gy
Plan Total Fractions Prescribed: 5
Plan Total Prescribed Dose: 30 Gy
Reference Point Dosage Given to Date: 30 Gy
Reference Point Session Dosage Given: 6 Gy
Session Number: 5

## 2023-05-11 NOTE — Progress Notes (Unsigned)
Date:  05/12/2023   ID:  [name], DOB October 07, 1945, MRN [id number]  Patient Location:  [address] [place] [address]   Provider location:   [facility], [place] office  PCP:  Emogene [name], MD  Cardiologist:  Fonnie Mu   Chief Complaint  Patient presents with   Follow up LS -2022    Patient c/o bilateral LE edema. Medications reviewed by the patient's medication list from [facility].     History of Present Illness:    [name] is a 76 y.o. female past medical history of Medication confusion/Medication noncompliance No family to help, uses [employer] for transportation morbid obesity,  severe lower extremity edema, dating back to 2012 systolic CHF, ejection fraction 25% in 2012  , Up to EF 50% to 55% in 03/2017 sleep apnea, scheduled for repeat testing. Previous machine broke pulmonary disease,  severe hypertension,  likely secondary to medication noncompliance who presents For follow-up of her hypertension, cardiomyopathy, edema  Last seen by myself in clinic November 2022 Followed by CHF clinic, last seen June 2024  Lives at [facility], [place], doing rehab, her goal is to be able to get strong enough to go home again Reports food is not very good, has been losing weight Was told that her weight was 191 pounds at [facility] (erroneous?) This would indicate 50 pound weight loss over the past several years Recent weights in the computer 230 up to 249  Previously on diuretic torsemide 40 twice daily Currently not on diuretics Reports her legs are more swollen, feel tight, very sore Sits much of the day with her legs down  Discussed recent hospitalizations with her history of breast cancer, found to have mass with vasogenic edema.  July 2024 Concern of metastatic disease.  Not amenable for biopsy due to the location per neurosurgery. Oncology and radiation oncology was consulted She said she has completed  rounds of radiation  In hospital 8/24 [facility] was having nausea vomiting [facility], called EMS herself CT abd/pelvis showed high-grade small-bowel obstruction with transition point within the right mid abdomen.  She was taken to the operating room for  exploratory laparotomy and extensive enterolysis.  NG tube and n.p.o  Eventually with bowel movements  Drinks a lot of water  Echo 6/23: EF 50 to 55%  Normal BMP  Presents with wraps in place on the left  EKG personally reviewed by myself on todays visit  EKG Interpretation Date/Time:  Tuesday May 12 2023 14:25:45 EDT Ventricular Rate:  79 PR Interval:  162 QRS Duration:  110 QT Interval:  392 QTC Calculation: 449 R Axis:   -18  Text Interpretation: Sinus rhythm with occasional Premature ventricular complexes and Premature atrial complexes Incomplete left bundle branch block Moderate voltage criteria for LVH, may be normal variant ( R in aVL , Cornell product ) Abnormal QRS-T angle, consider primary T wave abnormality When compared with ECG of 18-Apr-2023 00:55, No significant change was found Confirmed by Julien Nordmann [phone]) on 05/12/2023 2:46:21 PM    Other past medical history reviewed Not using her CPAP Previously received a new machine from [facility] mask  too small   hospitalization July 2018 for TIA and chest pain Slurring, head hurt, H/A, felt hot She had MRI brain without acute finding Neurology consult with no clear recommendations Chest pain resolved without intervention Carotid ultrasound without significant stenosis SNF was recommended    Past Medical History:  Diagnosis  Date   Abnormality of gait    Acute on chronic diastolic CHF (congestive heart failure) (HCC) 11/20/2017   Acute respiratory failure with hypoxia (HCC) 06/13/2022   AKI (acute kidney injury) (HCC) 03/03/2022   Breast cancer (HCC) 02/05/2005   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    Breast cancer (HCC) 2019   right breast   CHF (congestive heart failure) (HCC)    March 28, 2017 echo: EF 50-55%; 1) echo 07/2011: EF 20-25%, mild concentric hypertrophy, diffuse HK, regional WMA cannot be excluded, grade 1 DD, mitral valve with mild regurg, LA mildly dilated). 2) EF 40-45%, mild AS,                     Chronic kidney disease    Chronic kidney disease, stage 3b (HCC) 09/10/2022   Complication of anesthesia 2006   pt states "hard to wake up" after L breast surgery    Edema    GERD (gastroesophageal reflux disease)    Glucose intolerance 09/10/2022   Headache    Heart murmur    Hypertension    Hypertensive emergency 06/13/2022   Hypokinesis    global, EF 35-45 % from Echo.   Lower limb ulcer, calf, left, limited to breakdown of skin (HCC) 12/11/2020   Malignant neoplasm of breast (female), unspecified site 06/14/2018   RIGHT T1b, N0; ER/PR positive, HER-2 negative.   Neuropathy    Obesity    Pain in joint, site unspecified    Personal history of chemotherapy 2006   left breast   Personal history of radiation therapy 2019   right breast   Personal history of radiation therapy 2006   left breast   Pneumonia    Pyuria 08/22/2022   Sleep apnea    Syncope 03/03/2022   Tinea pedis    Unspecified hearing loss    bilateral   Weakness 03/03/2022   Past Surgical History:  Procedure Laterality Date   BREAST BIOPSY Right 2019   11 oc-INVASIVE MAMMARY CARCINOMA, NO SPECIAL TYPE   BREAST BIOPSY Right 2019   1030 oc- neg   BREAST LUMPECTOMY  2006   left breast    CATARACT EXTRACTION     left eye   HERNIA REPAIR  05/25/2006   Incarcerated omentum in ventral hernia, Ventralight mesh, combined lap/ open with omental resection.    IVC FILTER INSERTION N/A 03/30/2023   Procedure: IVC FILTER INSERTION;  Surgeon: Annice Needy, MD;  Location: [facility] INVASIVE CV LAB;  Service: Cardiovascular;  Laterality: N/A;   LAPAROTOMY N/A 04/20/2023    Procedure: EXPLORATORY LAPAROTOMY, LYSIS OF ADHESIONS WITH  MESSENTARIC BIOPSY AND REMOVAL OF MESH WITH METALLIC TACKS;  Surgeon: Carolan Shiver, MD;  Location: [facility] ORS;  Service: General;  Laterality: N/A;   PARTIAL MASTECTOMY WITH NEEDLE LOCALIZATION Right 06/14/2018   Procedure: PARTIAL MASTECTOMY WITH NEEDLE LOCALIZATION;  Surgeon: Earline Mayotte, MD;  Location: [facility] ORS;  Service: General;  Laterality: Right;   PORT-A-CATH REMOVAL  05/25/2006   SENTINEL NODE BIOPSY Right 06/14/2018   Procedure: SENTINEL NODE BIOPSY;  Surgeon: Earline Mayotte, MD;  Location: [facility] ORS;  Service: General;  Laterality: Right;   TUBAL LIGATION       Current Meds  Medication Sig   acetaminophen (TYLENOL) 325 MG tablet Take 2 tablets (650 mg total) by mouth every 6 (six) hours as needed for mild pain (or Fever >/= 101).   anastrozole (ARIMIDEX)  1 MG tablet Take 1 tablet (1 mg total) by mouth daily.   ascorbic acid (VITAMIN C) 500 MG tablet Take 1 tablet by mouth 2 (two) times daily.   Calcium Carbonate-Vitamin D 600-5 MG-MCG TABS Take 1 tablet by mouth 2 (two) times daily.   dexamethasone (DECADRON) 4 MG tablet Take 1 tablet (4 mg total) by mouth 2 (two) times daily with a meal.   diclofenac Sodium (VOLTAREN) 1 % GEL Apply 2 g topically 2 (two) times daily as needed.   ELIQUIS 5 MG TABS tablet Take 5 mg by mouth 2 (two) times daily.   Emollient (EUCERIN) lotion Apply topically 2 (two) times daily. Apply to dry skin especially bilateral lower extremities and arms.   empagliflozin (JARDIANCE) 10 MG TABS tablet Take 1 tablet (10 mg total) by mouth daily before breakfast.   feeding supplement (ENSURE ENLIVE / ENSURE PLUS) LIQD Take 237 mLs by mouth daily.   guaiFENesin (MUCINEX) 600 MG 12 hr tablet Take 600 mg by mouth every 12 (twelve) hours.   HYDROcodone-acetaminophen (NORCO/VICODIN) 5-325 MG tablet Take 1 tablet by mouth every 6 (six) hours as needed.   Incontinence Supply Disposable (BLADDER  CONTROL PADS EX ABSORB) MISC Apply 1 Dose topically as needed.    ipratropium-albuterol (DUONEB) 0.5-2.5 (3) MG/3ML SOLN Take 3 mLs by nebulization every 4 (four) hours as needed (wheezing).   isosorbide mononitrate (IMDUR) 60 MG 24 hr tablet Take 1 tablet (60 mg total) by mouth 2 (two) times daily.   loratadine (CLARITIN) 10 MG tablet Take 10 mg by mouth daily.   losartan (COZAAR) 50 MG tablet Take 50 mg by mouth daily.   ondansetron (ZOFRAN-ODT) 4 MG disintegrating tablet Take 4 mg by mouth every 8 (eight) hours as needed.   oxybutynin (DITROPAN) 5 MG tablet Take 5 mg by mouth 2 (two) times daily.   pantoprazole (PROTONIX) 40 MG tablet Take 1 tablet (40 mg total) by mouth 2 (two) times daily.   polyethylene glycol (MIRALAX / GLYCOLAX) 17 g packet Take 17 g by mouth daily. Skip the dose if no constipation   sennosides-docusate sodium (SENOKOT-S) 8.6-50 MG tablet Take 1 tablet by mouth 2 (two) times daily.   spironolactone (ALDACTONE) 25 MG tablet Take 12.5 mg by mouth daily.   vitamin B-12 (CYANOCOBALAMIN) 500 MCG tablet Take 500 mcg by mouth daily.   Vitamin D, Ergocalciferol, (DRISDOL) 1.25 MG (50000 UNIT) CAPS capsule Take 50,000 Units by mouth once a week.   Zinc Oxide (TRIPLE PASTE) 12.8 % ointment Apply barrier cream to buttocks every shift.     Allergies:   Patient has no known allergies.   Social History   Tobacco Use   Smoking status: Former    Current packs/day: 0.00    Average packs/day: 0.5 packs/day for 30.0 years (15.0 ttl pk-yrs)    Types: Cigarettes    Start date: 07/14/1970    Quit date: 07/14/2000    Years since quitting: 22.8   Smokeless tobacco: Never  Vaping Use   Vaping status: Never Used  Substance Use Topics   Alcohol use: No   Drug use: Not Currently    Types: Marijuana    Comment: endorses prior marijuana use 1999     Family Hx: The patient's family history includes Breast cancer (age of onset: 27) in an other family member; Cataracts in her mother;  Coronary artery disease in an other family member; Hypertension in her mother.  ROS:   Please see the history of present illness.  Review of Systems  Constitutional: Negative.   HENT: Negative.    Respiratory: Negative.    Cardiovascular:  Positive for leg swelling.  Gastrointestinal: Negative.   Musculoskeletal: Negative.   Neurological: Negative.   Psychiatric/Behavioral: Negative.    All other systems reviewed and are negative.    Labs/Other Tests and Data Reviewed:    Recent Labs: 08/22/2022: TSH 0.265 03/29/2023: B Natriuretic Peptide 212.9 04/22/2023: Magnesium 2.5 04/26/2023: ALT 8; Platelets 244 04/28/2023: BUN 19; Creatinine, Ser 0.64; Hemoglobin 11.7; Potassium 4.2; Sodium 139   Recent Lipid Panel Lab Results  Component Value Date/Time   CHOL 134 09/13/2022 05:11 AM   TRIG 66 09/13/2022 05:11 AM   HDL 37 (L) 09/13/2022 05:11 AM   CHOLHDL 3.6 09/13/2022 05:11 AM   LDLCALC 84 09/13/2022 05:11 AM    Wt Readings from Last 3 Encounters:  05/12/23 191 lb (86.6 kg)  04/20/23 210 lb (95.3 kg)  04/07/23 232 lb (105.2 kg)     Exam:    Vital Signs: Vital signs may also be detailed in the HPI BP (!) 110/54 (BP Location: Left Arm, Patient Position: Sitting, Cuff Size: Normal)   Pulse 79   Ht 5\' 9"  (1.753 m)   Wt 191 lb (86.6 kg) Comment: WT @ [facility] this am 05/12/2023  SpO2 96%   BMI 28.21 kg/m   Constitutional:  oriented to person, place, and time. No distress.  HENT:  Head: Grossly normal Eyes:  no discharge. No scleral icterus.  Neck: No JVD, no carotid bruits  Cardiovascular: Regular rate and rhythm, no murmurs appreciated 2+ pitting lower extremity edema, tight, shiny Pulmonary/Chest: Clear to auscultation bilaterally, no wheezes or rails Abdominal: Soft.  no distension.  no tenderness.  Musculoskeletal: Normal range of motion Neurological:  normal muscle tone. Coordination normal. No atrophy Skin: Skin warm and dry Psychiatric: normal affect,  pleasant  ASSESSMENT & PLAN:    Chronic diastolic CHF Diuretics held presumably sometime back, currently not on her torsemide Previously on torsemide 40 twice daily with potassium 20 daily She reports having worsening leg swelling I am concerned her weight was erroneous at [facility] given the radical change from prior numbers -Will recommend she restart torsemide 20 daily with potassium 10 daily Up titration of the torsemide and follow-up if needed -Recommend she moderate her fluid intake Continue Jardiance, losartan, spironolactone at current doses  Morbid obesity (HCC) Recommend low carbohydrate diet Working with PT  SOB (shortness of breath) Walks short distances, reports mild shortness of breath  Essential hypertension Blood pressure is well controlled on today's visit. No changes made to the medications.  OSA on CPAP Current mask on new machine does not fit her face History of noncompliance  Lymphedema Stressed compliance with her lymphedema compression pumps Leg elevation, Restart torsemide 20 daily with potassium 10 daily Up titration of the torsemide as needed    Total encounter time more than 40 minutes  Greater than 50% was spent in counseling and coordination of care with the patient   Signed, Julien Nordmann, MD  05/12/2023 2:52 PM    [facility] [address], [place], [place] [address]

## 2023-05-12 ENCOUNTER — Ambulatory Visit: Payer: Medicare Other | Attending: Cardiovascular Disease | Admitting: Cardiovascular Disease

## 2023-05-12 ENCOUNTER — Encounter: Payer: Self-pay | Admitting: Cardiovascular Disease

## 2023-05-12 VITALS — BP 110/54 | HR 79 | Ht 69.0 in | Wt 191.0 lb

## 2023-05-12 DIAGNOSIS — R001 Bradycardia, unspecified: Secondary | ICD-10-CM

## 2023-05-12 DIAGNOSIS — I890 Lymphedema, not elsewhere classified: Secondary | ICD-10-CM | POA: Diagnosis not present

## 2023-05-12 DIAGNOSIS — E1142 Type 2 diabetes mellitus with diabetic polyneuropathy: Secondary | ICD-10-CM

## 2023-05-12 DIAGNOSIS — I10 Essential (primary) hypertension: Secondary | ICD-10-CM | POA: Diagnosis not present

## 2023-05-12 DIAGNOSIS — I5032 Chronic diastolic (congestive) heart failure: Secondary | ICD-10-CM

## 2023-05-12 DIAGNOSIS — N183 Chronic kidney disease, stage 3 unspecified: Secondary | ICD-10-CM

## 2023-05-12 DIAGNOSIS — R55 Syncope and collapse: Secondary | ICD-10-CM

## 2023-05-12 DIAGNOSIS — R0602 Shortness of breath: Secondary | ICD-10-CM

## 2023-05-12 DIAGNOSIS — G4733 Obstructive sleep apnea (adult) (pediatric): Secondary | ICD-10-CM

## 2023-05-12 DIAGNOSIS — Z7984 Long term (current) use of oral hypoglycemic drugs: Secondary | ICD-10-CM

## 2023-05-12 MED ORDER — POTASSIUM CHLORIDE ER 10 MEQ PO TBCR: 10.0000 meq | EXTENDED_RELEASE_TABLET | Freq: Every day | ORAL | 3 refills | Status: DC

## 2023-05-12 MED ORDER — TORSEMIDE 20 MG PO TABS: 20.0000 mg | ORAL_TABLET | Freq: Every day | ORAL | 3 refills | Status: DC

## 2023-05-12 MED ORDER — TORSEMIDE 20 MG PO TABS: 20.0000 mg | ORAL_TABLET | Freq: Two times a day (BID) | ORAL | 3 refills | Status: DC

## 2023-05-12 NOTE — Patient Instructions (Addendum)
Medication Instructions:  Torsemide 20 mg daily Potassium 10 meq daily  If you need a refill on your cardiac medications before your next appointment, please call your pharmacy.   Lab work: No new labs needed  Testing/Procedures: No new testing needed  Follow-Up: At [facility], you and your health needs are our priority.  As part of our continuing mission to provide you with exceptional heart care, we have created designated Provider Care Teams.  These Care Teams include your primary Cardiologist (physician) and Advanced Practice Providers (APPs -  Physician Assistants and Nurse Practitioners) who all work together to provide you with the care you need, when you need it.  You will need a follow up appointment in 3 months with [name]  Providers on your designated Care Team:   [name] Ducking, NP [name] Listen, PA-C [name], [place]  COVID-19 Vaccine Information can be found at: [email or web address] For questions related to vaccine distribution or appointments, please email [email or web address] or call [phone].

## 2023-05-27 ENCOUNTER — Ambulatory Visit: Payer: Medicare Other | Admitting: Radiation Oncology

## 2023-05-28 ENCOUNTER — Ambulatory Visit
Admission: RE | Admit: 2023-05-28 | Discharge: 2023-05-28 | Disposition: A | Discharge status: Home / Self Care | Payer: Medicare Other | Source: Ambulatory Visit | Attending: Oncology | Admitting: Oncology

## 2023-05-28 DIAGNOSIS — Z853 Personal history of malignant neoplasm of breast: Secondary | ICD-10-CM | POA: Diagnosis not present

## 2023-05-28 DIAGNOSIS — D649 Anemia, unspecified: Secondary | ICD-10-CM | POA: Diagnosis not present

## 2023-05-28 DIAGNOSIS — Z1231 Encounter for screening mammogram for malignant neoplasm of breast: Secondary | ICD-10-CM | POA: Diagnosis not present

## 2023-05-28 DIAGNOSIS — Z08 Encounter for follow-up examination after completed treatment for malignant neoplasm: Secondary | ICD-10-CM | POA: Diagnosis present

## 2023-06-08 ENCOUNTER — Ambulatory Visit: Payer: Medicare Other | Admitting: Surgery

## 2023-06-10 ENCOUNTER — Ambulatory Visit: Payer: Medicare Other | Admitting: Radiation Oncology

## 2023-06-12 ENCOUNTER — Ambulatory Visit (INDEPENDENT_AMBULATORY_CARE_PROVIDER_SITE_OTHER): Payer: Medicare Other | Admitting: Surgery

## 2023-06-12 ENCOUNTER — Encounter: Payer: Self-pay | Admitting: Surgery

## 2023-06-12 VITALS — BP 103/63 | HR 76 | Temp 99.1°F | Ht 69.0 in | Wt 191.0 lb

## 2023-06-12 DIAGNOSIS — C50411 Malignant neoplasm of upper-outer quadrant of right female breast: Secondary | ICD-10-CM | POA: Diagnosis not present

## 2023-06-12 DIAGNOSIS — Z08 Encounter for follow-up examination after completed treatment for malignant neoplasm: Secondary | ICD-10-CM

## 2023-06-12 DIAGNOSIS — Z170 Estrogen receptor positive status [ER+]: Secondary | ICD-10-CM

## 2023-06-12 NOTE — Patient Instructions (Signed)
If you have any concerns or questions, please feel free to call our office. Follow up as needed.   Breast Self-Awareness Breast self-awareness is knowing how your breasts look and feel. You need to: Check your breasts on a regular basis. Tell your doctor about any changes. Become familiar with the look and feel of your breasts. This can help you catch a breast problem while it is still small and can be treated. You should do breast self-exams even if you have breast implants. What you need: A mirror. A well-lit room. A pillow or other soft object. How to do a breast self-exam Follow these steps to do a breast self-exam: Look for changes  Take off all the clothes above your waist. Stand in front of a mirror in a room with good lighting. Put your hands down at your sides. Compare your breasts in the mirror. Look for any difference between them, such as: A difference in shape. A difference in size. Wrinkles, dips, and bumps in one breast and not the other. Look at each breast for changes in the skin, such as: Redness. Scaly areas. Skin that has gotten thicker. Dimpling. Open sores (ulcers). Look for changes in your nipples, such as: Fluid coming out of a nipple. Fluid around a nipple. Bleeding. Dimpling. Redness. A nipple that looks pushed in (retracted), or that has changed position. Feel for changes Lie on your back. Feel each breast. To do this: Pick a breast to feel. Place a pillow under the shoulder closest to that breast. Put the arm closest to that breast behind your head. Feel the nipple area of that breast using the hand of your other arm. Feel the area with the pads of your three middle fingers by making small circles with your fingers. Use light, medium, and firm pressure. Continue the overlapping circles, moving downward over the breast. Keep making circles with your fingers. Stop when you feel your ribs. Start making circles with your fingers again, this time going  upward until you reach your collarbone. Then, make circles outward across your breast and into your armpit area. Squeeze your nipple. Check for discharge and lumps. Repeat these steps to check your other breast. Sit or stand in the tub or shower. With soapy water on your skin, feel each breast the same way you did when you were lying down. Write down what you find Writing down what you find can help you remember what to tell your doctor. Write down: What is normal for each breast. Any changes you find in each breast. These include: The kind of changes you find. A tender or painful breast. Any lump you find. Write down its size and where it is. When you last had your monthly period (menstrual cycle). General tips If you are breastfeeding, the best time to check your breasts is after you feed your baby or after you use a breast pump. If you get monthly bleeding, the best time to check your breasts is 5-7 days after your monthly cycle ends. With time, you will become comfortable with the self-exam. You will also start to know if there are changes in your breasts. Contact a doctor if: You see a change in the shape or size of your breasts or nipples. You see a change in the skin of your breast or nipples, such as red or scaly skin. You have fluid coming from your nipples that is not normal. You find a new lump or thick area. You have breast pain. You have  any concerns about your breast health. Summary Breast self-awareness includes looking for changes in your breasts and feeling for changes within your breasts. You should do breast self-awareness in front of a mirror in a well-lit room. If you get monthly periods (menstrual cycles), the best time to check your breasts is 5-7 days after your period ends. Tell your doctor about any changes you see in your breasts. Changes include changes in size, changes on the skin, painful or tender breasts, or fluid from your nipples that is not  normal. This information is not intended to replace advice given to you by your health care provider. Make sure you discuss any questions you have with your health care provider. Document Revised: 01/30/2022 Document Reviewed: 06/27/2021 Elsevier Patient Education  2024 [employer].

## 2023-06-12 NOTE — Progress Notes (Unsigned)
06/12/2023  History of Present Illness: [name] is a 77 y.o. female status post right breast lumpectomy and sentinel lymph node biopsy in October 2019 for right breast cancer.  She is also status post more remote left breast lumpectomy and sentinel lymph node biopsy in 2006 for left breast cancer.  Currently, she is on Arimidex for her right breast cancer.  She was recently admitted on 03/29/2023 with a possible brain mass concerning for metastatic disease and was treated with radiation and steroids.  Full body workup including a CT chest, abdomen, and pelvis did not show any evidence of metastatic disease and a nuclear medicine bone scan on 04/15/2023 also did not show any evidence of metastatic disease.  She continues on Arimidex for now.  She was also admitted recently on 04/18/2023 for small bowel obstruction requiring exploratory laparotomy with Dr. Hazle Quant.  Most recently, she had a mammogram on 05/28/2023 which did not show any findings suspicious for malignancy.  She reports that she is doing well and denies any pain in either breast site.  Past Medical History: Past Medical History:  Diagnosis Date   Abnormality of gait    Acute on chronic diastolic CHF (congestive heart failure) (HCC) 11/20/2017   Acute respiratory failure with hypoxia (HCC) 06/13/2022   AKI (acute kidney injury) (HCC) 03/03/2022   Breast cancer (HCC) 02/05/2005   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    Breast cancer (HCC) 2019   right breast   CHF (congestive heart failure) (HCC)    March 28, 2017 echo: EF 50-55%; 1) echo 07/2011: EF 20-25%, mild concentric hypertrophy, diffuse HK, regional WMA cannot be excluded, grade 1 DD, mitral valve with mild regurg, LA mildly dilated). 2) EF 40-45%, mild AS,                     Chronic kidney disease    Chronic kidney disease, stage 3b (HCC) 09/10/2022   Complication of anesthesia 2006    pt states "hard to wake up" after L breast surgery    Edema    GERD (gastroesophageal reflux disease)    Glucose intolerance 09/10/2022   Headache    Heart murmur    Hypertension    Hypertensive emergency 06/13/2022   Hypokinesis    global, EF 35-45 % from Echo.   Lower limb ulcer, calf, left, limited to breakdown of skin (HCC) 12/11/2020   Malignant neoplasm of breast (female), unspecified site 06/14/2018   RIGHT T1b, N0; ER/PR positive, HER-2 negative.   Neuropathy    Obesity    Pain in joint, site unspecified    Personal history of chemotherapy 2006   left breast   Personal history of radiation therapy 2019   right breast   Personal history of radiation therapy 2006   left breast   Pneumonia    Pyuria 08/22/2022   Sleep apnea    Syncope 03/03/2022   Tinea pedis    Unspecified hearing loss    bilateral   Weakness 03/03/2022     Past Surgical History: Past Surgical History:  Procedure Laterality Date   BREAST BIOPSY Right 2019   11 oc-INVASIVE MAMMARY CARCINOMA, NO SPECIAL TYPE   BREAST BIOPSY Right 2019   1030 oc- neg   BREAST LUMPECTOMY  2006   left breast    CATARACT EXTRACTION     left eye   HERNIA REPAIR  05/25/2006   Incarcerated omentum in ventral hernia,  Ventralight mesh, combined lap/ open with omental resection.    IVC FILTER INSERTION N/A 03/30/2023   Procedure: IVC FILTER INSERTION;  Surgeon: Annice Needy, MD;  Location: [facility];  Service: Cardiovascular;  Laterality: N/A;   LAPAROTOMY N/A 04/20/2023   Procedure: EXPLORATORY LAPAROTOMY, LYSIS OF ADHESIONS WITH  MESSENTARIC BIOPSY AND REMOVAL OF MESH WITH METALLIC TACKS;  Surgeon: Carolan Shiver, MD;  Location: [facility];  Service: General;  Laterality: N/A;   PARTIAL MASTECTOMY WITH NEEDLE LOCALIZATION Right 06/14/2018   Procedure: PARTIAL MASTECTOMY WITH NEEDLE LOCALIZATION;  Surgeon: Earline Mayotte, MD;  Location: [facility];  Service: General;  Laterality: Right;   PORT-A-CATH  REMOVAL  05/25/2006   SENTINEL NODE BIOPSY Right 06/14/2018   Procedure: SENTINEL NODE BIOPSY;  Surgeon: Earline Mayotte, MD;  Location: [facility];  Service: General;  Laterality: Right;   TUBAL LIGATION      Home Medications: Prior to Admission medications   Medication Sig Start Date End Date Taking? Authorizing Provider  acetaminophen (TYLENOL) 325 MG tablet Take 2 tablets (650 mg total) by mouth every 6 (six) hours as needed for mild pain (or Fever >/= 101). 09/15/22  Yes Gillis Santa, MD  acetaminophen (TYLENOL) 325 MG tablet Take 2 tablets (650 mg total) by mouth every 6 (six) hours as needed for mild pain (or Fever >/= 101). 04/28/23  Yes Wieting, Richard, MD  anastrozole (ARIMIDEX) 1 MG tablet Take 1 tablet (1 mg total) by mouth daily. 01/19/23  Yes Creig Hines, MD  ascorbic acid (VITAMIN C) 500 MG tablet Take 1 tablet by mouth 2 (two) times daily.   Yes [provider]  Calcium Carbonate-Vitamin D 600-5 MG-MCG TABS Take 1 tablet by mouth 2 (two) times daily.   Yes [provider]  dexamethasone (DECADRON) 4 MG tablet Take 1 tablet (4 mg total) by mouth 2 (two) times daily with a meal. 03/31/23  Yes Arnetha Courser, MD  diclofenac Sodium (VOLTAREN) 1 % GEL Apply 2 g topically 2 (two) times daily as needed.   Yes [provider]  ELIQUIS 5 MG TABS tablet Take 5 mg by mouth 2 (two) times daily. 04/08/23  Yes [provider]  Emollient (EUCERIN) lotion Apply topically 2 (two) times daily. Apply to dry skin especially bilateral lower extremities and arms. 09/15/22  Yes Gillis Santa, MD  empagliflozin (JARDIANCE) 10 MG TABS tablet Take 1 tablet (10 mg total) by mouth daily before breakfast. 08/23/22  Yes Marrion Coy, MD  feeding supplement (ENSURE ENLIVE / ENSURE PLUS) LIQD Take 237 mLs by mouth daily. 04/28/23  Yes Wieting, Richard, MD  guaiFENesin (MUCINEX) 600 MG 12 hr tablet Take 600 mg by mouth every 12 (twelve) hours.   Yes [provider]   HYDROcodone-acetaminophen (NORCO/VICODIN) 5-325 MG tablet Take 1 tablet by mouth every 6 (six) hours as needed. 04/28/23  Yes Alford Highland, MD  Incontinence Supply Disposable (BLADDER CONTROL PADS EX ABSORB) MISC Apply 1 Dose topically as needed.  03/22/18  Yes [provider]  ipratropium-albuterol (DUONEB) 0.5-2.5 (3) MG/3ML SOLN Take 3 mLs by nebulization every 4 (four) hours as needed (wheezing). 12/04/22  Yes [provider]  isosorbide mononitrate (IMDUR) 60 MG 24 hr tablet Take 1 tablet (60 mg total) by mouth 2 (two) times daily. 08/23/22  Yes Marrion Coy, MD  loratadine (CLARITIN) 10 MG tablet Take 10 mg by mouth daily. 07/20/19  Yes [provider]  losartan (COZAAR) 50 MG tablet Take 50 mg by mouth  daily.   Yes [provider]  ondansetron (ZOFRAN-ODT) 4 MG disintegrating tablet Take 4 mg by mouth every 8 (eight) hours as needed. 04/13/23  Yes [provider]  oxybutynin (DITROPAN) 5 MG tablet Take 5 mg by mouth 2 (two) times daily.   Yes [provider]  pantoprazole (PROTONIX) 40 MG tablet Take 1 tablet (40 mg total) by mouth 2 (two) times daily. 04/28/23  Yes Wieting, Richard, MD  polyethylene glycol (MIRALAX / GLYCOLAX) 17 g packet Take 17 g by mouth daily. Skip the dose if no constipation 09/15/22  Yes Gillis Santa, MD  potassium chloride (KLOR-CON) 10 MEQ tablet Take 1 tablet (10 mEq total) by mouth daily. 05/12/23 08/10/23 Yes Gollan, Tollie Pizza, MD  pregabalin (LYRICA) 75 MG capsule Take 75 mg by mouth daily. 03/19/23  Yes [provider]  sennosides-docusate sodium (SENOKOT-S) 8.6-50 MG tablet Take 1 tablet by mouth 2 (two) times daily.   Yes [provider]  spironolactone (ALDACTONE) 25 MG tablet Take 12.5 mg by mouth daily.   Yes [provider]  torsemide (DEMADEX) 20 MG tablet Take 1 tablet (20 mg total) by mouth daily. 05/12/23 08/10/23 Yes Gollan, Tollie Pizza, MD  vitamin B-12 (CYANOCOBALAMIN) 500 MCG  tablet Take 500 mcg by mouth daily.   Yes [provider]  Vitamin D, Ergocalciferol, (DRISDOL) 1.25 MG (50000 UNIT) CAPS capsule Take 50,000 Units by mouth once a week. 03/23/23  Yes [provider]  Zinc Oxide (TRIPLE PASTE) 12.8 % ointment Apply barrier cream to buttocks every shift.   Yes [provider]    Allergies: No Known Allergies  Review of Systems: Review of Systems  Constitutional:  Negative for chills and fever.  Respiratory:  Negative for shortness of breath.   Cardiovascular:  Negative for chest pain.  Gastrointestinal:  Negative for abdominal pain, nausea and vomiting.  Skin:  Negative for rash.    Physical Exam BP 103/63 (BP Location: Left Arm, Patient Position: Sitting, Cuff Size: Large)   Pulse 76   Temp 99.1 F (37.3 C) (Oral)   Ht 5\' 9"  (1.753 m)   Wt 191 lb (86.6 kg)   SpO2 99%   BMI 28.21 kg/m  CONSTITUTIONAL: No acute distress HEENT:  Normocephalic, atraumatic, extraocular motion intact. RESPIRATORY:  Normal respiratory effort without pathologic use of accessory muscles. CARDIOVASCULAR: Regular rhythm and rate. BREAST: Right breast status post lumpectomy with radiation related skin changes but otherwise no palpable masses or nipple changes.  No right axillary lymphadenopathy.  Left breast also status post remote lumpectomy with no palpable masses, skin changes, or nipple changes.  No left axillary lymphadenopathy. NEUROLOGIC:  Motor and sensation is grossly normal.  Cranial nerves are grossly intact. PSYCH:  Alert and oriented to person, place and time. Affect is normal.  Labs/Imaging: MRI brain on 03/29/2023: IMPRESSION: Peripherally enhancing mass with central nonenhancement along the gray-white junction of the right cingulate gyrus, measuring up to 13 mm with surrounding vasogenic edema. 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  CT chest, abdomen, pelvis on 03/29/2023: IMPRESSION: 1. No acute intrathoracic,  abdominal, or pelvic pathology. No evidence of metastatic disease. 2. Indeterminate 2 cm lesion from the lateral mid to lower pole of the right kidney. Further characterization with MRI without and with contrast on a nonemergent/outpatient basis recommended. 3. Aortic Atherosclerosis (ICD10-I70.0).  Bone scan on 04/15/2023: IMPRESSION: No scintigraphic evidence of osseous metastatic disease.  Bilateral mammogram on 05/28/2023: FINDINGS: There are no findings suspicious for malignancy.   IMPRESSION:  No mammographic evidence of malignancy. A result letter of this screening mammogram will be mailed directly to the patient.   RECOMMENDATION: Screening mammogram in one year. (Code:SM-B-01Y)   BI-RADS CATEGORY  1: Negative.   Assessment and Plan: This is a 77 y.o. female status post right breast lumpectomy and sentinel lymph node biopsy in October 2019.  - Patient reports that she is doing well today and denies any breast pain.  Exam today bilaterally is reassuring and mammogram also does not show any suspicious findings.  The patient had a recent admission in July for a brain mass and there was concern for metastatic disease from her breast cancer.  However all her other workup including CT scan of the chest, abdomen, and pelvis, nuclear medicine bone scan, and bilateral mammogram have not shown any new findings.  As such is unclear that this brain mass is truly metastatic disease.  Patient has follow-up with Dr. Rushie Chestnut on 06/15/2023 and with Dr. Smith Robert on 07/10/2023. - From the breast standpoint, there is no new findings she has been doing well.  She is now 5 years out from her last surgery on the right side.  Think at this point is okay to graduate from our services.  Discussed with her that she still needs to have yearly mammograms which can be arranged with her PCP or the oncology teams.  Discussed with her that if there are any suspicious findings or any concerns from her standpoint, she is  always free to reach [place] and contact us so we can see her in the office. - Follow-up as needed otherwise.  I spent 20 minutes dedicated to the care of this patient on the date of this encounter to include pre-visit review of records, face-to-face time with the patient discussing diagnosis and management, and any post-visit coordination of care.   Howie Ill, MD [facility]

## 2023-06-15 ENCOUNTER — Encounter: Payer: Self-pay | Admitting: Radiation Oncology

## 2023-06-15 ENCOUNTER — Ambulatory Visit
Admission: RE | Admit: 2023-06-15 | Discharge: 2023-06-15 | Disposition: A | Discharge status: Home / Self Care | Payer: Medicare Other | Source: Ambulatory Visit | Attending: Radiation Oncology | Admitting: Radiation Oncology

## 2023-06-15 VITALS — BP 105/56 | HR 72 | Temp 97.0°F | Resp 16

## 2023-06-15 DIAGNOSIS — D496 Neoplasm of unspecified behavior of brain: Secondary | ICD-10-CM | POA: Diagnosis present

## 2023-06-15 NOTE — Progress Notes (Signed)
Radiation Oncology Follow up Note  Name: [name]   Date:   06/15/2023 MRN:  [id number] DOB: 1946/02/21    This 77 y.o. 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  REFERRING PROVIDER: Emogene [name], MD  HPI: Patient is a 77 year old female wheelchair-bound poor communication skills now seen out 1 month having completed hypofractionated partial brain irradiation for solitary brain metastasis.  She states her vision is poor although this is predates her radiation.  She does have cataracts.  She specifically denies any focal neurologic deficits..  She recently was admitted for a small bowel obstruction requiring exploratory laparotomy.  She is followed by Dr. Smith [name].  COMPLICATIONS OF TREATMENT: none  FOLLOW UP COMPLIANCE: keeps appointments   PHYSICAL EXAM:  BP (!) 105/56   Pulse 72   Temp (!) 97 F (36.1 C) (Tympanic)   Resp 16  Wheelchair-bound morbidly obese patient with marked lower extremity edema in NAD.  Crude visual fields within normal range motor and sensory levels are equal and symmetric in upper lower extremities.  Well-developed well-nourished patient in NAD. HEENT reveals PERLA, EOMI, discs not visualized.  Oral cavity is clear. No oral mucosal lesions are identified. Neck is clear without evidence of cervical or supraclavicular adenopathy. Lungs are clear to A&P. Cardiac examination is essentially unremarkable with regular rate and rhythm without murmur rub or thrill. Abdomen is benign with no organomegaly or masses noted. Motor sensory and DTR levels are equal and symmetric in the upper and lower extremities. Cranial nerves II through XII are grossly intact. Proprioception is intact. No peripheral adenopathy or edema is identified. No motor or sensory levels are  noted. Crude visual fields are within normal range.  RADIOLOGY RESULTS: No current films to review  PLAN: Present time patient is clinically stable.  She has appointment next week with Dr. Smith [name] I will turn follow-up care over to medical oncology.  Be happy to reevaluate patient in time should further treatment be indicated.  Would obtain an MRI scan in about 2 to 3 months for follow-up.  I would like to take this opportunity to thank you for allowing me to participate in the care of your patient.[name] Miller, MD

## 2023-06-16 LAB — ORGANISM ID BY SEQUENCING

## 2023-06-16 LAB — ORGANISM ID BY MALDI

## 2023-07-09 ENCOUNTER — Other Ambulatory Visit: Payer: Self-pay

## 2023-07-09 DIAGNOSIS — D496 Neoplasm of unspecified behavior of brain: Secondary | ICD-10-CM

## 2023-07-09 DIAGNOSIS — C50911 Malignant neoplasm of unspecified site of right female breast: Secondary | ICD-10-CM

## 2023-07-10 ENCOUNTER — Inpatient Hospital Stay (HOSPITAL_BASED_OUTPATIENT_CLINIC_OR_DEPARTMENT_OTHER): Payer: Medicare Other | Admitting: Oncology

## 2023-07-10 ENCOUNTER — Encounter: Payer: Self-pay | Admitting: Oncology

## 2023-07-10 ENCOUNTER — Inpatient Hospital Stay: Payer: Medicare Other | Attending: Oncology

## 2023-07-10 VITALS — BP 112/98 | HR 71 | Temp 97.8°F | Resp 17

## 2023-07-10 DIAGNOSIS — C7931 Secondary malignant neoplasm of brain: Secondary | ICD-10-CM | POA: Insufficient documentation

## 2023-07-10 DIAGNOSIS — Z79811 Long term (current) use of aromatase inhibitors: Secondary | ICD-10-CM | POA: Insufficient documentation

## 2023-07-10 DIAGNOSIS — Z7901 Long term (current) use of anticoagulants: Secondary | ICD-10-CM | POA: Diagnosis not present

## 2023-07-10 DIAGNOSIS — Z170 Estrogen receptor positive status [ER+]: Secondary | ICD-10-CM | POA: Insufficient documentation

## 2023-07-10 DIAGNOSIS — Z87891 Personal history of nicotine dependence: Secondary | ICD-10-CM | POA: Insufficient documentation

## 2023-07-10 DIAGNOSIS — Z86718 Personal history of other venous thrombosis and embolism: Secondary | ICD-10-CM | POA: Diagnosis not present

## 2023-07-10 DIAGNOSIS — C50911 Malignant neoplasm of unspecified site of right female breast: Secondary | ICD-10-CM

## 2023-07-10 DIAGNOSIS — R600 Localized edema: Secondary | ICD-10-CM | POA: Diagnosis not present

## 2023-07-10 DIAGNOSIS — C50919 Malignant neoplasm of unspecified site of unspecified female breast: Secondary | ICD-10-CM | POA: Diagnosis not present

## 2023-07-10 DIAGNOSIS — C50411 Malignant neoplasm of upper-outer quadrant of right female breast: Secondary | ICD-10-CM | POA: Diagnosis present

## 2023-07-10 DIAGNOSIS — D496 Neoplasm of unspecified behavior of brain: Secondary | ICD-10-CM

## 2023-07-10 LAB — CMP (CANCER CENTER ONLY)
ALT: 19 U/L (ref 0–44)
AST: 14 U/L — ABNORMAL LOW (ref 15–41)
Albumin: 2.9 g/dL — ABNORMAL LOW (ref 3.5–5.0)
Alkaline Phosphatase: 44 U/L (ref 38–126)
Anion gap: 8 (ref 5–15)
BUN: 56 mg/dL — ABNORMAL HIGH (ref 8–23)
CO2: 27 mmol/L (ref 22–32)
Calcium: 8 mg/dL — ABNORMAL LOW (ref 8.9–10.3)
Chloride: 103 mmol/L (ref 98–111)
Creatinine: 1.39 mg/dL — ABNORMAL HIGH (ref 0.44–1.00)
GFR, Estimated: 39 mL/min — ABNORMAL LOW (ref >60)
Glucose, Bld: 167 mg/dL — ABNORMAL HIGH (ref 70–99)
Potassium: 4.1 mmol/L (ref 3.5–5.1)
Sodium: 138 mmol/L (ref 135–145)
Total Bilirubin: 0.6 mg/dL (ref 0.3–1.2)
Total Protein: 6 g/dL — ABNORMAL LOW (ref 6.5–8.1)

## 2023-07-10 LAB — CBC WITH DIFFERENTIAL (CANCER CENTER ONLY)
Abs Immature Granulocytes: 0.47 10*3/uL — ABNORMAL HIGH (ref 0.00–0.07)
Basophils Absolute: 0.1 10*3/uL (ref 0.0–0.1)
Basophils Relative: 1 %
Eosinophils Absolute: 0 10*3/uL (ref 0.0–0.5)
Eosinophils Relative: 0 %
HCT: 36.2 % (ref 36.0–46.0)
Hemoglobin: 11.2 g/dL — ABNORMAL LOW (ref 12.0–15.0)
Immature Granulocytes: 4 %
Lymphocytes Relative: 7 %
Lymphs Abs: 0.8 10*3/uL (ref 0.7–4.0)
MCH: 29.2 pg (ref 26.0–34.0)
MCHC: 30.9 g/dL (ref 30.0–36.0)
MCV: 94.3 fL (ref 80.0–100.0)
Monocytes Absolute: 0.6 10*3/uL (ref 0.1–1.0)
Monocytes Relative: 5 %
Neutro Abs: 9.6 10*3/uL — ABNORMAL HIGH (ref 1.7–7.7)
Neutrophils Relative %: 83 %
Platelet Count: 211 10*3/uL (ref 150–400)
RBC: 3.84 MIL/uL — ABNORMAL LOW (ref 3.87–5.11)
RDW: 16.1 % — ABNORMAL HIGH (ref 11.5–15.5)
WBC Count: 11.6 10*3/uL — ABNORMAL HIGH (ref 4.0–10.5)
nRBC: 0.2 % (ref 0.0–0.2)

## 2023-07-10 MED ORDER — DOXYCYCLINE HYCLATE 100 MG PO TABS: 100.0000 mg | ORAL_TABLET | Freq: Two times a day (BID) | ORAL | 0 refills | Status: DC

## 2023-07-11 LAB — CANCER ANTIGEN 27.29: CA 27.29: 19.1 U/mL (ref 0.0–38.6)

## 2023-07-12 LAB — CANCER ANTIGEN 15-3: CA 15-3: 14.3 U/mL (ref 0.0–25.0)

## 2023-07-12 NOTE — Progress Notes (Signed)
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  Mammogram and ultrasound in August 2019 showed 0.9 x 0.9 x 0.8 cm irregular spiculated mass in the 11 o'clock position of the right breast.  There was a concern for an abnormal intramammary lymph node as well.  No other abnormal axillary adenopathy noted.  Patient underwent ultrasound-guided biopsy of both the right breast mass as well as the axillary lymph node lymph node biopsy was negative for malignancy.  Biopsy of the right breast mass showed 6 mm, grade 1 ER PR positive and HER-2/neu negative tumor.   Final pathology showed 7 mm grade 2 invasive mammary carcinoma ER PR positive her 2 negative. Negative nodes. Clear margins. Oncotype score came as low risk 8 and she does not require adjuvant chemotherapy. She completed adjuvant radiation and started Arimidex in December 2019.   Patient was admitted to the hospital in July 2024 with symptoms of right facial droop. 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 This area was not deemed to be resectable or possible to biopsy.  Patient was seen by radiation oncology for empiric radiation. Also found to have right posterior tibial vein acute DVT during the hospitalization.  She is on Eliquis for the same CT chest abdomen pelvis without contrast did not show any evidence of metastatic disease.  Patient continues to remain on Arimidex at this time  Interval history-patient resides at [facility].  She has been having worsening bilateral lower extremity edema and seeping wounds.  She reports pain in her bilateral lower extremities  ECOG PS- 3 Pain scale- 4 Opioid associated constipation- no  Review of systems- Review of Systems  Constitutional:  Positive for malaise/fatigue. Negative for chills, fever and weight loss.  HENT:  Negative for congestion, ear discharge and nosebleeds.   Eyes:  Negative for blurred vision.  Respiratory:  Negative for cough, hemoptysis, sputum production, shortness of breath and wheezing.   Cardiovascular:  Positive for leg swelling. Negative for chest pain, palpitations, orthopnea and claudication.  Gastrointestinal:  Negative for abdominal pain, blood in stool, constipation, diarrhea, heartburn, melena, nausea and vomiting.  Genitourinary:  Negative for dysuria, flank pain, frequency, hematuria and urgency.  Musculoskeletal:  Negative for back pain, joint pain and myalgias.  Skin:  Negative for rash.  Neurological:  Negative for dizziness, tingling, focal weakness, seizures, weakness and headaches.  Endo/Heme/Allergies:  Does not bruise/bleed easily.  Psychiatric/Behavioral:  Negative for depression and suicidal ideas. The patient does not have insomnia.       No Known Allergies   Past Medical History:  Diagnosis Date   Abnormality of gait    Acute on chronic diastolic CHF (  congestive heart failure) (HCC) 11/20/2017   Acute respiratory failure with hypoxia (HCC) 06/13/2022   AKI (acute kidney injury) (HCC) 03/03/2022   Breast cancer (HCC) 02/05/2005   LEFT 3.5 cm invasive  mammary carcinoma, T2, N0,; triple negative, whole breast radiation, cytoxan, taxotere chemotherapy.    Breast cancer (HCC) 2019   right breast   CHF (congestive heart failure) (HCC)    March 28, 2017 echo: EF 50-55%; 1) echo 07/2011: EF 20-25%, mild concentric hypertrophy, diffuse HK, regional WMA cannot be excluded, grade 1 DD, mitral valve with mild regurg, LA mildly dilated). 2) EF 40-45%, mild AS,                     Chronic kidney disease    Chronic kidney disease, stage 3b (HCC) 09/10/2022   Complication of anesthesia 2006   pt states "hard to wake up" after L breast surgery    Edema    GERD (gastroesophageal reflux disease)    Glucose intolerance 09/10/2022   Headache    Heart murmur    Hypertension    Hypertensive emergency 06/13/2022   Hypokinesis    global, EF 35-45 % from Echo.   Lower limb ulcer, calf, left, limited to breakdown of skin (HCC) 12/11/2020   Malignant neoplasm of breast (female), unspecified site 06/14/2018   RIGHT T1b, N0; ER/PR positive, HER-2 negative.   Neuropathy    Obesity    Pain in joint, site unspecified    Personal history of chemotherapy 2006   left breast   Personal history of radiation therapy 2019   right breast   Personal history of radiation therapy 2006   left breast   Pneumonia    Pyuria 08/22/2022   Sleep apnea    Syncope 03/03/2022   Tinea pedis    Unspecified hearing loss    bilateral   Weakness 03/03/2022     Past Surgical History:  Procedure Laterality Date   BREAST BIOPSY Right 2019   11 oc-INVASIVE MAMMARY CARCINOMA, NO SPECIAL TYPE   BREAST BIOPSY Right 2019   1030 oc- neg   BREAST LUMPECTOMY  2006   left breast    CATARACT EXTRACTION     left eye   HERNIA REPAIR  05/25/2006   Incarcerated omentum in ventral hernia, Ventralight mesh, combined lap/ open with omental resection.    IVC FILTER INSERTION N/A 03/30/2023   Procedure: IVC FILTER INSERTION;  Surgeon: Annice Needy, MD;  Location: [facility] IN[facility]IVE [facility];   Service: Cardiovascular;  Laterality: N/A;   LAPAROTOMY N/A 04/20/2023   Procedure: EXPLORATORY LAPAROTOMY, LYSIS OF ADHESIONS WITH  MESSENTARIC BIOPSY AND REMOVAL OF MESH WITH METALLIC TACKS;  Surgeon: Carolan Shiver, MD;  Location: [facility] ORS;  Service: General;  Laterality: N/A;   PARTIAL MASTECTOMY WITH NEEDLE LOCALIZATION Right 06/14/2018   Procedure: PARTIAL MASTECTOMY WITH NEEDLE LOCALIZATION;  Surgeon: Earline Mayotte, MD;  Location: [facility] ORS;  Service: General;  Laterality: Right;   PORT-A-CATH REMOVAL  05/25/2006   SENTINEL NODE BIOPSY Right 06/14/2018   Procedure: SENTINEL NODE BIOPSY;  Surgeon: Earline Mayotte, MD;  Location: [facility] ORS;  Service: General;  Laterality: Right;   TUBAL LIGATION      Social History   Socioeconomic History   Marital status: Divorced    Spouse name: Not on file   Number of children: Not on file   Years of education: Not on file   Highest education level: Not on file  Occupational History   Occupation: retired  Tobacco Use   Smoking status: Former    Current packs/day: 0.00    Average packs/day: 0.5 packs/day for 30.0 years (15.0 ttl pk-yrs)    Types: Cigarettes    Start date: 07/14/1970    Quit date: 07/14/2000    Years since quitting: 23.0   Smokeless tobacco: Never  Vaping Use   Vaping status: Never Used  Substance and Sexual Activity   Alcohol use: No   Drug use: Not Currently    Types: Marijuana    Comment: endorses prior marijuana use 1999   Sexual activity: Never  Other Topics Concern   Not on file  Social History Narrative   Not on file   Social Determinants of Health   Financial Resource Strain: Not on file  Food Insecurity: No Food Insecurity (04/18/2023)   Hunger Vital Sign    Worried About Running Out of Food in the Last Year: Never true    Ran Out of Food in the Last Year: Never true  Transportation Needs: No Transportation Needs (04/18/2023)   PRAPARE - Administrator, Civil Service (Medical): No     Lack of Transportation (Non-Medical): No  Physical Activity: Not on file  Stress: Not on file  Social Connections: Not on file  Intimate Partner Violence: Not At Risk (04/18/2023)   Humiliation, Afraid, Rape, and Kick questionnaire    Fear of Current or Ex-Partner: No    Emotionally Abused: No    Physically Abused: No    Sexually Abused: No    Family History  Problem Relation Age of Onset   Hypertension Mother        deceased 79   Cataracts Mother    Breast cancer Other 28       maternal half-sister; deceased 39   Coronary artery disease Other      Current Outpatient Medications:    doxycycline (VIBRA-TABS) 100 MG tablet, Take 1 tablet (100 mg total) by mouth 2 (two) times daily., Disp: 14 tablet, Rfl: 0   latanoprost (XALATAN) 0.005 % ophthalmic solution, Apply to eye., Disp: , Rfl:    acetaminophen (TYLENOL) 325 MG tablet, Take 2 tablets (650 mg total) by mouth every 6 (six) hours as needed for mild pain (or Fever >/= 101)., Disp: , Rfl:    acetaminophen (TYLENOL) 325 MG tablet, Take 2 tablets (650 mg total) by mouth every 6 (six) hours as needed for mild pain (or Fever >/= 101)., Disp: , Rfl:    anastrozole (ARIMIDEX) 1 MG tablet, Take 1 tablet (1 mg total) by mouth daily., Disp: 30 tablet, Rfl: 6   ascorbic acid (VITAMIN C) 500 MG tablet, Take 1 tablet by mouth 2 (two) times daily., Disp: , Rfl:    Calcium Carbonate-Vitamin D 600-5 MG-MCG TABS, Take 1 tablet by mouth 2 (two) times daily., Disp: , Rfl:    dexamethasone (DECADRON) 4 MG tablet, Take 1 tablet (4 mg total) by mouth 2 (two) times daily with a meal. (Patient not taking: Reported on 06/15/2023), Disp: , Rfl:    diclofenac Sodium (VOLTAREN) 1 % GEL, Apply 2 g topically 2 (two) times daily as needed., Disp: , Rfl:    ELIQUIS 5 MG TABS tablet, Take 5 mg by mouth 2 (two) times daily., Disp: , Rfl:    Emollient (EUCERIN) lotion, Apply topically 2 (two) times daily. Apply to dry skin especially bilateral lower extremities and  arms., Disp: 240 mL, Rfl: 0   empagliflozin (JARDIANCE) 10 MG TABS tablet, Take 1 tablet (10  mg total) by mouth daily before breakfast., Disp: 30 tablet, Rfl: 0   feeding supplement (ENSURE ENLIVE / ENSURE PLUS) LIQD, Take 237 mLs by mouth daily., Disp: 14220 mL, Rfl: 0   guaiFENesin (MUCINEX) 600 MG 12 hr tablet, Take 600 mg by mouth every 12 (twelve) hours., Disp: , Rfl:    HYDROcodone-acetaminophen (NORCO/VICODIN) 5-325 MG tablet, Take 1 tablet by mouth every 6 (six) hours as needed., Disp: 16 tablet, Rfl: 0   Incontinence Supply Disposable (BLADDER CONTROL PADS EX ABSORB) MISC, Apply 1 Dose topically as needed. , Disp: , Rfl:    ipratropium-albuterol (DUONEB) 0.5-2.5 (3) MG/3ML SOLN, Take 3 mLs by nebulization every 4 (four) hours as needed (wheezing)., Disp: , Rfl:    isosorbide mononitrate (IMDUR) 60 MG 24 hr tablet, Take 1 tablet (60 mg total) by mouth 2 (two) times daily., Disp: 60 tablet, Rfl: 0   loratadine (CLARITIN) 10 MG tablet, Take 10 mg by mouth daily., Disp: , Rfl:    losartan (COZAAR) 50 MG tablet, Take 50 mg by mouth daily., Disp: , Rfl:    neomycin-polymyxin b-dexamethasone (MAXITROL) 3.5-10000-0.1 SUSP, Apply to eye., Disp: , Rfl:    ondansetron (ZOFRAN-ODT) 4 MG disintegrating tablet, Take 4 mg by mouth every 8 (eight) hours as needed., Disp: , Rfl:    oxybutynin (DITROPAN) 5 MG tablet, Take 5 mg by mouth 2 (two) times daily., Disp: , Rfl:    pantoprazole (PROTONIX) 40 MG tablet, Take 1 tablet (40 mg total) by mouth 2 (two) times daily., Disp: 60 tablet, Rfl: 0   polyethylene glycol (MIRALAX / GLYCOLAX) 17 g packet, Take 17 g by mouth daily. Skip the dose if no constipation, Disp: 14 each, Rfl: 0   potassium chloride (KLOR-CON) 10 MEQ tablet, Take 1 tablet (10 mEq total) by mouth daily., Disp: 90 tablet, Rfl: 3   pregabalin (LYRICA) 75 MG capsule, Take 75 mg by mouth daily., Disp: , Rfl:    sennosides-docusate sodium (SENOKOT-S) 8.6-50 MG tablet, Take 1 tablet by mouth 2 (two)  times daily., Disp: , Rfl:    spironolactone (ALDACTONE) 25 MG tablet, Take 12.5 mg by mouth daily., Disp: , Rfl:    torsemide (DEMADEX) 20 MG tablet, Take 1 tablet (20 mg total) by mouth daily., Disp: 90 tablet, Rfl: 3   vitamin B-12 (CYANOCOBALAMIN) 500 MCG tablet, Take 500 mcg by mouth daily., Disp: , Rfl:    Vitamin D, Ergocalciferol, (DRISDOL) 1.25 MG (50000 UNIT) CAPS capsule, Take 50,000 Units by mouth once a week., Disp: , Rfl:    Zinc Oxide (TRIPLE PASTE) 12.8 % ointment, Apply barrier cream to buttocks every shift., Disp: , Rfl:   Physical exam:  Vitals:   07/10/23 1421  BP: (!) 112/98  Pulse: 71  Resp: 17  Temp: 97.8 F (36.6 C)  TempSrc: Tympanic  SpO2: 99%   Physical Exam Constitutional:      Comments: She is sitting in a wheelchair. Appears chronically ill  Cardiovascular:     Rate and Rhythm: Normal rate and regular rhythm.     Heart sounds: Normal heart sounds.  Pulmonary:     Effort: Pulmonary effort is normal.     Breath sounds: Normal breath sounds.  Abdominal:     General: Bowel sounds are normal.     Palpations: Abdomen is soft.  Musculoskeletal:     Comments: B/l seeping edema of lower extremities  Skin:    General: Skin is warm and dry.  Neurological:     Mental Status: She  is alert and oriented to person, place, and time.         Latest Ref Rng & Units 07/10/2023    1:50 PM  CMP  Glucose 70 - 99 mg/dL 782   BUN 8 - 23 mg/dL 56   Creatinine 9.56 - 1.00 mg/dL 2.13   Sodium 086 - 578 mmol/L 138   Potassium 3.5 - 5.1 mmol/L 4.1   Chloride 98 - 111 mmol/L 103   CO2 22 - 32 mmol/L 27   Calcium 8.9 - 10.3 mg/dL 8.0   Total Protein 6.5 - 8.1 g/dL 6.0   Total Bilirubin 0.3 - 1.2 mg/dL 0.6   Alkaline Phos 38 - 126 U/L 44   AST 15 - 41 U/L 14   ALT 0 - 44 U/L 19       Latest Ref Rng & Units 07/10/2023    1:50 PM  CBC  WBC 4.0 - 10.5 K/uL 11.6   Hemoglobin 12.0 - 15.0 g/dL 46.9   Hematocrit 62.9 - 46.0 % 36.2   Platelets 150 - 400 K/uL 211      Assessment and plan- Patient is a 77 y.o. female with history of stage I ER/PR positive HER2 negative breast cancer diagnosed in August 2019 s/p surgery and adjuvant radiation therapy and currently on Arimidex.  This is a routine follow-up visit  Patient noted to have a 13 mm Brain lesion on MRI brain in July 2024 which was concerning for metastatic disease.  The site was not amenable for biopsy or resection.  She underwent empiric radiation to this area.  It is unclear if this was truly related to her breast cancer or not.  Systemic scans did not show any evidence of metastatic disease and therefore have continued her on Arimidex without adding any CDK inhibitors at this time.  I will consider changing therapy if there is evidence of metastatic disease down the line.  I will see her back in 3 months with labs and scans prior.  Bilateral sleeping edema of lower extremities: I am giving her doxycycline 100 mg twice daily for 7 days for possible cellulitis.  She needs to discuss this with her primary care doctor further as to how to best manage her bilateral lower extremity edema.  Patient does have a history of right posterior tibial vein DVT in July 2024 and is currently on Eliquis which she will continue.  I think it is safe for her to get the IVC filter out as it can be a nidus for further clots in the future and we will get in touch with vascular surgery about this   Visit Diagnosis 1. Malignant neoplasm of right female breast, unspecified estrogen receptor status, unspecified site of breast (HCC)   2. Breast cancer metastasized to brain, unspecified laterality [facility])      Dr. Owens Shark, MD, MPH [facility] [phone] 07/12/2023 9:17 AM

## 2023-07-16 ENCOUNTER — Telehealth: Payer: Self-pay | Admitting: *Deleted

## 2023-07-16 NOTE — Telephone Encounter (Signed)
Staff at [facility]  wanted to know the next appts for her. I went over them . She has  2 scans on the same day and then appt for the MD to see her. The person on the phone felt like she was going to forget all the instructions and I told her that can send her fax with appts and it has info and where to go for the appts. She gave me fax number [phone]  and it sent though transmission complete. I called this afternoon to see if they got it fax. The staff that I spoke to is gone for the day so they gave me [phone] and I sent it again and it went through

## 2023-07-17 ENCOUNTER — Telehealth: Payer: Self-pay | Admitting: *Deleted

## 2023-07-17 NOTE — Telephone Encounter (Signed)
Called Lisa at [employer] stating that fax had been sent and confirmation that it went through with patient's appointments as requested.  Misty Stanley stated she would check and if it was not received would call cancer center.

## 2023-07-22 ENCOUNTER — Ambulatory Visit: Payer: Medicare Other | Admitting: Oncology

## 2023-07-22 ENCOUNTER — Other Ambulatory Visit: Payer: Medicare Other

## 2023-08-13 ENCOUNTER — Other Ambulatory Visit: Payer: Self-pay

## 2023-08-19 ENCOUNTER — Encounter: Payer: Self-pay | Admitting: Nurse Practitioner

## 2023-08-19 ENCOUNTER — Ambulatory Visit: Payer: Medicare Other | Admitting: Nurse Practitioner

## 2023-08-19 NOTE — Progress Notes (Unsigned)
Office Visit    Patient Name: [name] Date of Encounter: 08/19/2023  Primary Care Provider:  Emogene [name], MD Primary Cardiologist:  Julien Nordmann, MD  Chief Complaint    77 y.o. female   Past Medical History  Subjective   Past Medical History:  Diagnosis Date   Abnormality of gait    Acute on chronic diastolic CHF (congestive heart failure) (HCC) 11/20/2017   Acute respiratory failure with hypoxia (HCC) 06/13/2022   AKI (acute kidney injury) (HCC) 03/03/2022   Breast cancer (HCC) 02/05/2005   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    Breast cancer (HCC) 2019   right breast   Chronic kidney disease, stage 3b (HCC) 09/10/2022   Complication of anesthesia 2006   pt states "hard to wake up" after L breast surgery    DVT (deep venous thrombosis) (HCC)    a. 03/2023 R PT Vein DVT following abd surgery-->s/p IVC filter.   Edema    GERD (gastroesophageal reflux disease)    Glucose intolerance 09/10/2022   Headache    Heart failure with improved ejection fraction (HFimpEF) (HCC)    a. 07/2011 Echo: EF 20-25%; b. 03/2017 Echo: EF 50-55%; c. 02/2022 Echo: EF 50-55%. mild LVH, GrI DD, Nl RV fxn, mild BAE, mild MR, mild AoV sclerosis, mild AI.   Heart murmur    Hypertension    Hypertensive emergency 06/13/2022   Lower limb ulcer, calf, left, limited to breakdown of skin (HCC) 12/11/2020   Malignant neoplasm of breast (female), unspecified site 06/14/2018   RIGHT T1b, N0; ER/PR positive, HER-2 negative.   Neuropathy    Obesity    Pain in joint, site unspecified    Personal history of chemotherapy 2006   left breast   Personal history of radiation therapy 2019   right breast   Personal history of radiation therapy 2006   left breast   Pneumonia    Pyuria 08/22/2022   Sleep apnea    Syncope 03/03/2022   Tinea pedis    Unspecified hearing loss    bilateral   Weakness 03/03/2022    Past Surgical History:  Procedure Laterality Date   BREAST BIOPSY Right 2019   11 oc-INVASIVE MAMMARY CARCINOMA, NO SPECIAL TYPE   BREAST BIOPSY Right 2019   1030 oc- neg   BREAST LUMPECTOMY  2006   left breast    CATARACT EXTRACTION     left eye   HERNIA REPAIR  05/25/2006   Incarcerated omentum in ventral hernia, Ventralight mesh, combined lap/ open with omental resection.    IVC FILTER INSERTION N/A 03/30/2023   Procedure: IVC FILTER INSERTION;  Surgeon: Annice Needy, MD;  Location: [facility];  Service: Cardiovascular;  Laterality: N/A;   LAPAROTOMY N/A 04/20/2023   Procedure: EXPLORATORY LAPAROTOMY, LYSIS OF ADHESIONS WITH  MESSENTARIC BIOPSY AND REMOVAL OF MESH WITH METALLIC TACKS;  Surgeon: Carolan Shiver, MD;  Location: [facility];  Service: General;  Laterality: N/A;   PARTIAL MASTECTOMY WITH NEEDLE LOCALIZATION Right 06/14/2018   Procedure: PARTIAL MASTECTOMY WITH NEEDLE LOCALIZATION;  Surgeon: Earline Mayotte, MD;  Location: [facility];  Service: General;  Laterality: Right;   PORT-A-CATH REMOVAL  05/25/2006   SENTINEL NODE BIOPSY Right 06/14/2018   Procedure: SENTINEL NODE BIOPSY;  Surgeon: Earline Mayotte, MD;  Location: [facility];  Service: General;  Laterality: Right;   TUBAL LIGATION      Allergies  No Known  Allergies    History of Present Illness      77 y.o. y/o female {There is no content from the last Narrative History section.}     Objective  Home Medications    Current Outpatient Medications  Medication Sig Dispense Refill   acetaminophen (TYLENOL) 325 MG tablet Take 2 tablets (650 mg total) by mouth every 6 (six) hours as needed for mild pain (or Fever >/= 101).     acetaminophen (TYLENOL) 325 MG tablet Take 2 tablets (650 mg total) by mouth every 6 (six) hours as needed for mild pain (or Fever >/= 101).     anastrozole (ARIMIDEX) 1 MG tablet Take 1 tablet (1 mg total) by mouth daily. 30 tablet 6   ascorbic acid (VITAMIN C) 500 MG  tablet Take 1 tablet by mouth 2 (two) times daily.     Calcium Carbonate-Vitamin D 600-5 MG-MCG TABS Take 1 tablet by mouth 2 (two) times daily.     dexamethasone (DECADRON) 4 MG tablet Take 1 tablet (4 mg total) by mouth 2 (two) times daily with a meal. (Patient not taking: Reported on 06/15/2023)     diclofenac Sodium (VOLTAREN) 1 % GEL Apply 2 g topically 2 (two) times daily as needed.     doxycycline (VIBRA-TABS) 100 MG tablet Take 1 tablet (100 mg total) by mouth 2 (two) times daily. 14 tablet 0   ELIQUIS 5 MG TABS tablet Take 5 mg by mouth 2 (two) times daily.     Emollient (EUCERIN) lotion Apply topically 2 (two) times daily. Apply to dry skin especially bilateral lower extremities and arms. 240 mL 0   empagliflozin (JARDIANCE) 10 MG TABS tablet Take 1 tablet (10 mg total) by mouth daily before breakfast. 30 tablet 0   feeding supplement (ENSURE ENLIVE / ENSURE PLUS) LIQD Take 237 mLs by mouth daily. 14220 mL 0   guaiFENesin (MUCINEX) 600 MG 12 hr tablet Take 600 mg by mouth every 12 (twelve) hours.     HYDROcodone-acetaminophen (NORCO/VICODIN) 5-325 MG tablet Take 1 tablet by mouth every 6 (six) hours as needed. 16 tablet 0   Incontinence Supply Disposable (BLADDER CONTROL PADS EX ABSORB) MISC Apply 1 Dose topically as needed.      ipratropium-albuterol (DUONEB) 0.5-2.5 (3) MG/3ML SOLN Take 3 mLs by nebulization every 4 (four) hours as needed (wheezing).     isosorbide mononitrate (IMDUR) 60 MG 24 hr tablet Take 1 tablet (60 mg total) by mouth 2 (two) times daily. 60 tablet 0   latanoprost (XALATAN) 0.005 % ophthalmic solution Apply to eye.     loratadine (CLARITIN) 10 MG tablet Take 10 mg by mouth daily.     losartan (COZAAR) 50 MG tablet Take 50 mg by mouth daily.     neomycin-polymyxin b-dexamethasone (MAXITROL) 3.5-10000-0.1 SUSP Apply to eye.     ondansetron (ZOFRAN-ODT) 4 MG disintegrating tablet Take 4 mg by mouth every 8 (eight) hours as needed.     oxybutynin (DITROPAN) 5 MG tablet  Take 5 mg by mouth 2 (two) times daily.     pantoprazole (PROTONIX) 40 MG tablet Take 1 tablet (40 mg total) by mouth 2 (two) times daily. 60 tablet 0   polyethylene glycol (MIRALAX / GLYCOLAX) 17 g packet Take 17 g by mouth daily. Skip the dose if no constipation 14 each 0   potassium chloride (KLOR-CON) 10 MEQ tablet Take 1 tablet (10 mEq total) by mouth daily. 90 tablet 3   pregabalin (LYRICA) 75 MG capsule Take 75 mg  by mouth daily.     sennosides-docusate sodium (SENOKOT-S) 8.6-50 MG tablet Take 1 tablet by mouth 2 (two) times daily.     spironolactone (ALDACTONE) 25 MG tablet Take 12.5 mg by mouth daily.     torsemide (DEMADEX) 20 MG tablet Take 1 tablet (20 mg total) by mouth daily. 90 tablet 3   vitamin B-12 (CYANOCOBALAMIN) 500 MCG tablet Take 500 mcg by mouth daily.     Vitamin D, Ergocalciferol, (DRISDOL) 1.25 MG (50000 UNIT) CAPS capsule Take 50,000 Units by mouth once a week.     Zinc Oxide (TRIPLE PASTE) 12.8 % ointment Apply barrier cream to buttocks every shift.     No current facility-administered medications for this visit.     Physical Exam    VS:  There were no vitals taken for this visit. , BMI There is no height or weight on file to calculate BMI.       GEN: Well nourished, well developed, in no acute distress. HEENT: normal. Neck: Supple, no JVD, carotid bruits, or masses. Cardiac: RRR, no murmurs, rubs, or gallops. No clubbing, cyanosis, edema.  Radials 2+/PT 2+ and equal bilaterally.  Respiratory:  Respirations regular and unlabored, clear to auscultation bilaterally. GI: Soft, nontender, nondistended, BS + x 4. MS: no deformity or atrophy. Skin: warm and dry, no rash. Neuro:  Strength and sensation are intact. Psych: Normal affect.  Accessory Clinical Findings    ECG personally reviewed by me today -    *** - no acute changes.  Lab Results  Component Value Date   WBC 11.6 (H) 07/10/2023   HGB 11.2 (L) 07/10/2023   HCT 36.2 07/10/2023   MCV 94.3  07/10/2023   PLT 211 07/10/2023   Lab Results  Component Value Date   CREATININE 1.39 (H) 07/10/2023   BUN 56 (H) 07/10/2023   NA 138 07/10/2023   K 4.1 07/10/2023   CL 103 07/10/2023   CO2 27 07/10/2023   Lab Results  Component Value Date   ALT 19 07/10/2023   AST 14 (L) 07/10/2023   ALKPHOS 44 07/10/2023   BILITOT 0.6 07/10/2023   Lab Results  Component Value Date   CHOL 134 09/13/2022   HDL 37 (L) 09/13/2022   LDLCALC 84 09/13/2022   TRIG 66 09/13/2022   CHOLHDL 3.6 09/13/2022    Lab Results  Component Value Date   HGBA1C 6.3 (H) 09/11/2022   Lab Results  Component Value Date   TSH 0.265 (L) 08/22/2022    Labs from Care Everywhere dated May 18, 2023:  Hemoglobin A1c 6.8.    Assessment & Plan    1.  ***  Nicolasa Ducking, NP 08/19/2023, 9:48 AM

## 2023-08-24 ENCOUNTER — Ambulatory Visit: Payer: Medicare Other | Attending: Physician Assistant | Admitting: Emergency Medicine

## 2023-08-24 ENCOUNTER — Telehealth: Payer: Self-pay | Admitting: Emergency Medicine

## 2023-08-24 ENCOUNTER — Encounter: Payer: Self-pay | Admitting: Physician Assistant

## 2023-08-24 VITALS — BP 93/52 | HR 69 | Ht 69.0 in | Wt 247.0 lb

## 2023-08-24 DIAGNOSIS — N179 Acute kidney failure, unspecified: Secondary | ICD-10-CM

## 2023-08-24 DIAGNOSIS — I10 Essential (primary) hypertension: Secondary | ICD-10-CM | POA: Diagnosis not present

## 2023-08-24 DIAGNOSIS — I890 Lymphedema, not elsewhere classified: Secondary | ICD-10-CM | POA: Diagnosis not present

## 2023-08-24 DIAGNOSIS — N1832 Chronic kidney disease, stage 3b: Secondary | ICD-10-CM

## 2023-08-24 DIAGNOSIS — G4733 Obstructive sleep apnea (adult) (pediatric): Secondary | ICD-10-CM

## 2023-08-24 DIAGNOSIS — I5032 Chronic diastolic (congestive) heart failure: Secondary | ICD-10-CM | POA: Diagnosis not present

## 2023-08-24 NOTE — Telephone Encounter (Signed)
Unable to call to report to facility - no one to take report. Report summary faxed to Trish, caregiver for pt.

## 2023-08-24 NOTE — Progress Notes (Addendum)
Cardiology Office Note:    Date:  08/24/2023  ID:  [name], DOB 1946-06-15, MRN [id number] PCP: Emogene [name], MD  [facility] Providers Cardiologist:  Julien Nordmann, MD       Patient Profile:      [name] is a 77 year old female with visit pertinent history of breast cancer in 2006, CKD, anemia, T2DM, PVD, HTN, GERD, OSA, previous tobacco use, HFpEF, carotid artery disease.   She had established with cardiology on November 2012 with Dr. Mariah Milling for evaluation of CHF and hypertension.  She has history of systolic CHF dating back to 2012 with echocardiogram showing LVEF 25%.  Echocardiogram July 2018 showing improved EF 50-55%. Most recent echocardiogram June 2023 showing LVEF 50-55%, mild LVH, grade 1 DD, RV SF normal, RV size mildly enlarged, biatrial size mildly dilated, mild mitral valve regurgitation, mild calcification of aortic valve, mild aortic valve regurgitation, aortic valve sclerosis, no aortic stenosis.  Patient was admitted to the hospital in July 2024 with symptoms of right facial droop. 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 This area was not deemed to be resectable or possible to biopsy. Patient was seen by radiation oncology for empiric radiation. Also found to have right posterior tibial vein acute DVT during the hospitalization. Again admitted to hospital on 04/2023 for small bowel obstruction.   Was last seen by Dr. Mariah Milling on September 2024 where she had reported shortness of breath and worsening lymphedema.  She was started on torsemide 20 mg with potassium supplementation.  It was noted on compliance with her CPAP due to mask not fitting her face.  It was stressed regarding compliance with her lymphedema compression pumps.      History of Present Illness:  [name] is a 77 y.o. female who returns for her  47-month follow-up.   Patient is doing well from a cardiac standpoint.  She notes that she has had ongoing lower leg edema that have not improved with the addition of torsemide from the last visit.  She notes that she frequently urinates on herself as she is mostly wheelchair-bound and unable to clean herself up.  She notes that the nursing staff at her facility wraps her legs daily.  She notes that she has had lymphedema compression pumps in the past that were very beneficial but since she moved into a SNF she has not been able to use these.  She notes her weight has been stable at the facility.  She does note she has felt more fatigued over the last month or 2 and has fallen asleep just sitting up in her wheelchair multiple times.  She is not currently compliant with her CPAP due to mask not fitting her well.  She notes that her blood pressure at the facility have been ranging from 100-110s.  She does not get any exercise as she is mostly wheelchair-bound and she describes her diet as being "just okay" as sometimes the food is not that good.  She denies any exertional angina.  She denies chest pain, shortness of breath, palpitations, melena, hematuria, hemoptysis, diaphoresis, weakness, presyncope, syncope, orthopnea, and PND.        Review of Systems  Constitutional: Positive for malaise/fatigue. Negative for weight gain and weight loss.  Cardiovascular:  Positive for leg swelling. Negative for chest pain, claudication, cyanosis, dyspnea on exertion, irregular heartbeat, near-syncope, orthopnea, palpitations, paroxysmal nocturnal  dyspnea and syncope.  Respiratory:  Negative for cough, hemoptysis and shortness of breath.   Gastrointestinal:  Negative for abdominal pain, hematochezia and melena.  Genitourinary:  Negative for hematuria.  Neurological:  Positive for excessive daytime sleepiness. Negative for dizziness and light-headedness.     See HPI     Studies Reviewed:   EKG  Interpretation Date/Time:  Monday August 24 2023 14:10:38 EST Ventricular Rate:  69 PR Interval:  168 QRS Duration:  108 QT Interval:  418 QTC Calculation: 447 R Axis:   -33  Text Interpretation: Sinus rhythm with Premature atrial complexes and Premature ventricular complexes or Fusion complexes Left axis deviation Incomplete left bundle branch block Moderate voltage criteria for LVH, may be normal variant ( R in aVL , Cornell product ) Confirmed by Rise Paganini [phone]) on 08/24/2023 5:29:30 PM    Echocardiogram 03/04/2022 1. Left ventricular ejection fraction, by estimation, is 50 to 55%. The  left ventricle has low normal function. Left ventricular endocardial  border not optimally defined to evaluate regional wall motion. The left  ventricular internal cavity size was  moderately dilated. There is mild left ventricular hypertrophy. Left  ventricular diastolic parameters are consistent with Grade I diastolic  dysfunction (impaired relaxation).   2. Right ventricular systolic function is normal. The right ventricular  size is mildly enlarged. Tricuspid regurgitation signal is inadequate for  assessing PA pressure.   3. Left atrial size was mildly dilated.   4. Right atrial size was mildly dilated.   5. The mitral valve is abnormal. Mild mitral valve regurgitation. No  evidence of mitral stenosis.   6. The aortic valve has an indeterminant number of cusps. There is mild  calcification of the aortic valve. There is moderate thickening of the  aortic valve. Aortic valve regurgitation is mild. Aortic valve  sclerosis/calcification is present, without any   evidence of aortic stenosis.   7. The inferior vena cava is normal in size with <50% respiratory  variability, suggesting right atrial pressure of 8 mmHg.   Carotid US 03/28/2017 IMPRESSION: Similar appearance of trace bilateral atherosclerotic plaque resulting in less than 50% diameter stenoses bilaterally compared  to 03/22/2014. No interval progression.   Vertebral arteries remain patent with antegrade flow. Risk Assessment/Calculations:             Physical Exam:   VS:  BP (!) 93/52 (BP Location: Left Arm, Patient Position: Sitting, Cuff Size: Large)   Pulse 69   Ht 5\' 9"  (1.753 m)   Wt 247 lb (112 kg)   SpO2 96%   BMI 36.48 kg/m    Wt Readings from Last 3 Encounters:  08/24/23 247 lb (112 kg)  06/12/23 191 lb (86.6 kg)  05/12/23 191 lb (86.6 kg)    Constitutional:      Appearance: Normal and healthy appearance.  HENT:     Head: Normocephalic.  Neck:     Vascular: JVD normal.  Pulmonary:     Effort: Pulmonary effort is normal.     Breath sounds: Normal breath sounds.  Chest:     Chest wall: Not tender to palpatation.  Cardiovascular:     PMI at left midclavicular line. Normal rate. Regular rhythm. Normal S1. Normal S2.      Murmurs: There is no murmur.     No gallop.  No click. No rub.  Pulses:    Intact distal pulses.  Edema:    Thigh: bilateral 3+ edema of the thigh.    Pretibial: bilateral 3+  edema of the pretibial area.    Ankle: bilateral 3+ pitting edema of the ankle.    Feet: bilateral 3+ edema of the feet with pitting on the left. Musculoskeletal: Normal range of motion.     Cervical back: Normal range of motion and neck supple. Skin:    General: Skin is warm and dry.  Neurological:     General: No focal deficit present.     Mental Status: Alert, oriented to person, place, and time and oriented to person, place and time.  Psychiatric:        Attention and Perception: Attention and perception normal.        Mood and Affect: Mood normal.        Behavior: Behavior is cooperative.        Thought Content: Thought content normal.        Assessment and Plan:  HFpEF / AKI  -She is NYHA class II and euvolemic at this time.  Echocardiogram on 03/04/2022 with a LVEF 50-55% and grade 1 diastolic dysfunction.  She is having ongoing lower extremity edema that is likely  related to her lymphedema given no improvement in symptoms with her diuretic.  Her most recent CMP on 07/2023 showed creatinine of 1.39 (baseline 0.6) and GFR of 39 (baseline >60), this is likely in the setting of diuretic therapy given this started after addition of torsemide.  With no additional improvement in lower leg swelling and new onset AKI plan to discontinue torsemide and potassium supplementation at this time.  Plan to hold spironolactone at least until follow-up visit and repeat of her BMP.  Plan to repeat BMP today.  The patient denied any SOB, DOE, weight gain, orthopnea.  Continued GDMT Jardiance 10 mg once daily, losartan 50 mg once daily, and/or 60 mg twice daily.  I will have her follow-up in 1 month.  OSA on CPAP -Currently noncompliant with CPAP given mask does not fit her face.  I have educated her to reach out to the nursing staff at her facility to call manufacturer for new mask fitting.  She was educated on uncontrolled OSA and effect on CVD.  Lymphedema / Leg swelling -Not improved with addition of torsemide 20 mg that was started 05/2023.  She has noted history of compression pumps that were highly effective for her swelling.  Will defer to SNF nursing staff on restarting her compression pumps given recent history of DVT.  Essential hypertension -BP today 93/52 and repeat 104/74.  She does note ongoing fatigue over the past month, unsure what her daily blood pressures are as I do not have a BP log today.  Her fatigue could likely be due to hypotension. Given her AKI likely in the setting of dehydration/diuretic therapy I expect her blood pressure to gradually increase.  Will have her nursing facility document blood pressure log daily.  Spironolactone discontinued as noted above.  Continue losartan 50 mg once daily, isosorbide 60 mg twice daily.   Obesity -Current BMI 36.48.  I have recommended a heart healthy/Mediterranean diet.  I have encouraged an increase in fiber, fruit,  vegetables.  She will need to decrease processed foods and foods high in sugar and focus on foods high in protein.  Her most recent albumin level is 2.9, I would like for her to improve her diet.              Dispo:  Return in about 1 month (around 09/24/2023).  Signed, Denyce Robert, NP

## 2023-08-24 NOTE — Patient Instructions (Signed)
Medication Instructions:  Your physician recommends the following medication changes.  STOP TAKING: Torsemide Potassium chloride  HOLD: Spirolactone for 1 month (until seen by cardiology)  *If you need a refill on your cardiac medications before your next appointment, please call your pharmacy*   Lab Work: Your provider would like for you to have following labs drawn today BMP.   If you have labs (blood work) drawn today and your tests are completely normal, you will receive your results only by: MyChart Message (if you have MyChart) OR A paper copy in the mail If you have any lab test that is abnormal or we need to change your treatment, we will call you to review the results.   Follow-Up: At [facility], you and your health needs are our priority.  As part of our continuing mission to provide you with exceptional heart care, we have created designated Provider Care Teams.  These Care Teams include your primary Cardiologist (physician) and Advanced Practice Providers (APPs -  Physician Assistants and Nurse Practitioners) who all work together to provide you with the care you need, when you need it.  We recommend signing up for the patient portal called "MyChart".  Sign up information is provided on this After Visit Summary.  MyChart is used to connect with patients for Virtual Visits (Telemedicine).  Patients are able to view lab/test results, encounter notes, upcoming appointments, etc.  Non-urgent messages can be sent to your provider as well.   To learn more about what you can do with MyChart, go to [email or web address].    Your next appointment:   1 month(s)  Provider:   You may see Julien Nordmann, MD or one of the following Advanced Practice Providers on your designated Care Team:   Eula Listen, [place]

## 2023-08-25 ENCOUNTER — Other Ambulatory Visit: Payer: Self-pay | Admitting: Emergency Medicine

## 2023-08-25 DIAGNOSIS — Z79899 Other long term (current) drug therapy: Secondary | ICD-10-CM

## 2023-08-25 LAB — BASIC METABOLIC PANEL
BUN/Creatinine Ratio: 39 — ABNORMAL HIGH (ref 12–28)
BUN: 60 mg/dL — ABNORMAL HIGH (ref 8–27)
CO2: 29 mmol/L (ref 20–29)
Calcium: 8.4 mg/dL — ABNORMAL LOW (ref 8.7–10.3)
Chloride: 100 mmol/L (ref 96–106)
Creatinine, Ser: 1.54 mg/dL — ABNORMAL HIGH (ref 0.57–1.00)
Glucose: 131 mg/dL — ABNORMAL HIGH (ref 70–99)
Potassium: 4.2 mmol/L (ref 3.5–5.2)
Sodium: 145 mmol/L — ABNORMAL HIGH (ref 134–144)
eGFR: 35 mL/min/{1.73_m2} — ABNORMAL LOW (ref >59)

## 2023-09-21 ENCOUNTER — Other Ambulatory Visit (HOSPITAL_COMMUNITY): Payer: Self-pay

## 2023-09-30 NOTE — Progress Notes (Unsigned)
Cardiology Office Note    Date:  10/01/2023   ID:  [name], DOB 24-Sep-1945, MRN [id number]  PCP:  Emogene [name], MD  Cardiologist:  Julien Nordmann, MD  Electrophysiologist:  None   Chief Complaint: Follow-up  History of Present Illness:   [name] is a 78 y.o. 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  Remote echo in 07/2011 showed an EF 20 to 25%, mild concentric LVH, diffuse hypokinesis, grade 1 diastolic dysfunction, mild mitral regurgitation, moderately dilated left atrium, mildly reduced RV systolic function with mildly large ventricular cavity size, and RVSP 72 mmHg.  Further testing was not pursued at that time.  She was lost to follow-up until reestablishing in 2014, at which time she was again lost to follow-up until being evaluated in 2017.  She was admitted to the hospital in 2018 with TIA.  Echo at that time showed an EF of 50 to 55% with no regional wall motion abnormalities, grade 1 diastolic dysfunction, trivial aortic insufficiency, mildly dilated left atrium normal RV systolic function and RVSP.  Most recent echo in 02/2022 showed an EF of 50 to 55%, moderately dilated LV internal cavity size, mild LVH, grade 1 diastolic dysfunction, normal RV systolic function with mildly enlarged ventricular cavity size, mild biatrial enlargement, mild mitral regurgitation, mild aortic insufficiency, and aortic valve sclerosis without evidence of stenosis.  At follow-up with primary cardiologist in 05/2023, there was concern erroneous weights at the outside facility with  a [facility] pounds with recent weights in [facility] ranging from 230 to 249 pounds.  At that time, she was not on a diuretic with recommendation for her to restart torsemide 20 mg daily with up titration as indicated.  She followed up in the office in 08/2023 with unchanged lower extremity swelling with the addition of torsemide.  She reported lymphedema pumps were previously very helpful, though had been unable to use these while at SNF.  She reported stable weight at her facility.  She remained off of CPAP due to mask not fitting well.  Blood pressure was soft at 93/52.  Weight was 247 pounds which was consistent with prior [facility] readings.  Labs noted at that time demonstrated AKI with recommendation for the patient to discontinue torsemide, spironolactone, and potassium.  Follow-up labs obtained that day showed progressive AKI as outlined below.  She is without symptoms of angina or cardiac decompensation at this time.  She denies dyspnea, dizziness, presyncope, or syncope.  No palpitations or progressive orthopnea.  She does continue to note bilateral lower extremity swelling.  She reports is pending most of her day sitting in a wheelchair with her feet hanging down.  Weight has remained stable despite discontinuing torsemide at last visit.  She does not add salt to food and is drinking less than 2 L of liquid daily.  She is without falls or symptoms concerning for bleeding.  Labs independently reviewed: 08/2023 - BUN 60, serum creatinine 1.54, potassium 4.2 07/2023 - Hgb 11.2, PLT 211, albumin 2.9, AST/ALT not elevated 05/2023 - A1c 6.8 04/2023 - magnesium 2.5 09/2022 - TC 134, TG 66, HDL 37, LDL  84 08/2022 - TSH 0.265, free T4 elevated at 1.37  Past Medical History:  Diagnosis Date   Abnormality of gait    Acute on chronic diastolic CHF (congestive heart failure) (HCC) 11/20/2017   Acute respiratory failure with hypoxia (HCC) 06/13/2022   AKI (acute kidney injury) (HCC)  03/03/2022   Breast cancer (HCC) 02/05/2005   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    Breast cancer (HCC) 2019   right breast   Chronic kidney disease, stage 3b (HCC) 09/10/2022   Complication of anesthesia 2006   pt states "hard to wake up" after L breast surgery    DVT (deep venous thrombosis) (HCC)    a. 03/2023 R PT Vein DVT following abd surgery-->s/p IVC filter.   Edema    GERD (gastroesophageal reflux disease)    Glucose intolerance 09/10/2022   Headache    Heart failure with improved ejection fraction (HFimpEF) (HCC)    a. 07/2011 Echo: EF 20-25%; b. 03/2017 Echo: EF 50-55%; c. 02/2022 Echo: EF 50-55%. mild LVH, GrI DD, Nl RV fxn, mild BAE, mild MR, mild AoV sclerosis, mild AI.   Heart murmur    Hypertension    Hypertensive emergency 06/13/2022   Lower limb ulcer, calf, left, limited to breakdown of skin (HCC) 12/11/2020   Malignant neoplasm of breast (female), unspecified site 06/14/2018   RIGHT T1b, N0; ER/PR positive, HER-2 negative.   Neuropathy    Obesity    Pain in joint, site unspecified    Personal history of chemotherapy 2006   left breast   Personal history of radiation therapy 2019   right breast   Personal history of radiation therapy 2006   left breast   Pneumonia    Pyuria 08/22/2022   Sleep apnea    Syncope 03/03/2022   Tinea pedis    Unspecified hearing loss    bilateral   Weakness 03/03/2022    Past Surgical History:  Procedure Laterality Date   BREAST BIOPSY Right 2019   11 oc-INVASIVE MAMMARY CARCINOMA, NO SPECIAL TYPE   BREAST BIOPSY Right 2019   1030 oc- neg   BREAST LUMPECTOMY  2006   left breast    CATARACT EXTRACTION     left eye   HERNIA REPAIR  05/25/2006   Incarcerated omentum in ventral hernia, Ventralight mesh, combined lap/ open with omental resection.    IVC FILTER INSERTION N/A 03/30/2023   Procedure: IVC FILTER INSERTION;  Surgeon: Annice Needy, MD;  Location: [facility];  Service: Cardiovascular;  Laterality: N/A;   LAPAROTOMY N/A 04/20/2023   Procedure: EXPLORATORY LAPAROTOMY, LYSIS OF ADHESIONS WITH  MESSENTARIC BIOPSY AND REMOVAL OF MESH WITH METALLIC TACKS;  Surgeon: Carolan Shiver, MD;  Location: [facility];  Service: General;  Laterality: N/A;   PARTIAL MASTECTOMY WITH NEEDLE LOCALIZATION Right 06/14/2018   Procedure: PARTIAL MASTECTOMY WITH NEEDLE LOCALIZATION;  Surgeon: Earline Mayotte, MD;  Location: [facility] OR[facility];  Service: General;  Laterality: Right;   PORT-A-CATH REMOVAL  05/25/2006   SENTINEL NODE BIOPSY Right 06/14/2018   Procedure: SENTINEL NODE BIOPSY;  Surgeon: Earline Mayotte, MD;  Location: [facility] OR[facility];  Service: General;  Laterality: Right;   TUBAL LIGATION      Current Medications: Current Meds  Medication Sig   acetaminophen (TYLENOL) 325 MG tablet Take 2 tablets (650 mg total) by mouth every 6 (six) hours as needed for mild pain (or Fever >/= 101).   anastrozole (ARIMIDEX)  1 MG tablet Take 1 tablet (1 mg total) by mouth daily.   ascorbic acid (VITAMIN C) 500 MG tablet Take 1 tablet by mouth 2 (two) times daily.   Calcium Carbonate-Vitamin D 600-5 MG-MCG TABS Take 1 tablet by mouth 2 (two) times daily.   dexamethasone (DECADRON) 4 MG tablet Take 1 tablet (4 mg total) by mouth 2 (two) times daily with a meal.   diclofenac Sodium (VOLTAREN) 1 % GEL Apply 2 g topically 2 (two) times daily as needed.   ELIQUIS 5 MG TABS tablet Take 5 mg by mouth 2 (two) times daily.   Emollient (EUCERIN) lotion Apply topically 2 (two) times daily. Apply to dry skin especially bilateral lower extremities and arms.   empagliflozin (JARDIANCE) 10 MG TABS tablet Take 1 tablet (10 mg total) by mouth daily before breakfast.   HYDROcodone-acetaminophen (NORCO/VICODIN) 5-325 MG tablet Take 1 tablet by mouth every 6 (six) hours as needed.   Incontinence Supply Disposable (BLADDER CONTROL PADS EX ABSORB) MISC Apply 1 Dose  topically as needed.    ipratropium-albuterol (DUONEB) 0.5-2.5 (3) MG/3ML SOLN Take 3 mLs by nebulization every 4 (four) hours as needed (wheezing).   isosorbide mononitrate (IMDUR) 60 MG 24 hr tablet Take 1 tablet (60 mg total) by mouth 2 (two) times daily.   lactulose (CHRONULAC) 10 GM/15ML solution SMARTSIG:Milliliter(s) By Mouth   latanoprost (XALATAN) 0.005 % ophthalmic solution Apply to eye.   lidocaine (LIDODERM) 5 % 2 patches daily.   loratadine (CLARITIN) 10 MG tablet Take 10 mg by mouth daily.   ondansetron (ZOFRAN-ODT) 4 MG disintegrating tablet Take 4 mg by mouth every 8 (eight) hours as needed.   oxybutynin (DITROPAN) 5 MG tablet Take 5 mg by mouth 2 (two) times daily.   pantoprazole (PROTONIX) 40 MG tablet Take 1 tablet (40 mg total) by mouth 2 (two) times daily.   polyethylene glycol (MIRALAX / GLYCOLAX) 17 g packet Take 17 g by mouth daily. Skip the dose if no constipation   pregabalin (LYRICA) 75 MG capsule Take 75 mg by mouth daily.   sennosides-docusate sodium (SENOKOT-S) 8.6-50 MG tablet Take 1 tablet by mouth 2 (two) times daily.   vitamin B-12 (CYANOCOBALAMIN) 500 MCG tablet Take 500 mcg by mouth daily.   Vitamin D, Ergocalciferol, (DRISDOL) 1.25 MG (50000 UNIT) CAPS capsule Take 50,000 Units by mouth once a week.   Zinc Oxide (TRIPLE PASTE) 12.8 % ointment Apply barrier cream to buttocks every shift.    Allergies:   Patient has no known allergies.   Social History   Socioeconomic History   Marital status: Divorced    Spouse name: Not on file   Number of children: Not on file   Years of education: Not on file   Highest education level: Not on file  Occupational History   Occupation: retired  Tobacco Use   Smoking status: Former    Current packs/day: 0.00    Average packs/day: 0.5 packs/day for 30.0 years (15.0 ttl pk-yrs)    Types: Cigarettes    Start date: 07/14/1970    Quit date: 07/14/2000    Years since quitting: 23.2   Smokeless tobacco: Never  Vaping  Use   Vaping status: Never Used  Substance and Sexual Activity   Alcohol use: No   Drug use: Not Currently    Types: Marijuana    Comment: endorses prior marijuana use 1999   Sexual activity: Never  Other Topics Concern   Not on file  Social History Narrative  Not on file   Social Drivers of Health   Financial Resource Strain: Not on file  Food Insecurity: No Food Insecurity (04/18/2023)   Hunger Vital Sign    Worried About Running Out of Food in the Last Year: Never true    Ran Out of Food in the Last Year: Never true  Transportation Needs: No Transportation Needs (04/18/2023)   PRAPARE - Administrator, Civil Service (Medical): No    Lack of Transportation (Non-Medical): No  Physical Activity: Not on file  Stress: Not on file  Social Connections: Not on file     Family History:  The patient's family history includes Breast cancer (age of onset: 5) in an other family member; Cataracts in her mother; Coronary artery disease in an other family member; Hypertension in her mother.  ROS:   12-point review of systems is negative unless otherwise noted in the HPI.   EKGs/Labs/Other Studies Reviewed:    Studies reviewed were summarized above. The additional studies were reviewed today:  2D echo 03/04/2022: 1. Left ventricular ejection fraction, by estimation, is 50 to 55%. The  left ventricle has low normal function. Left ventricular endocardial  border not optimally defined to evaluate regional wall motion. The left  ventricular internal cavity size was  moderately dilated. There is mild left ventricular hypertrophy. Left  ventricular diastolic parameters are consistent with Grade I diastolic  dysfunction (impaired relaxation).   2. Right ventricular systolic function is normal. The right ventricular  size is mildly enlarged. Tricuspid regurgitation signal is inadequate for  assessing PA pressure.   3. Left atrial size was mildly dilated.   4. Right atrial size  was mildly dilated.   5. The mitral valve is abnormal. Mild mitral valve regurgitation. No  evidence of mitral stenosis.   6. The aortic valve has an indeterminant number of cusps. There is mild  calcification of the aortic valve. There is moderate thickening of the  aortic valve. Aortic valve regurgitation is mild. Aortic valve  sclerosis/calcification is present, without any   evidence of aortic stenosis.   7. The inferior vena cava is normal in size with <50% respiratory  variability, suggesting right atrial pressure of 8 mmHg.  __________  2D echo 03/28/2017: - Left ventricle: The cavity size was normal. Systolic function was    normal. The estimated ejection fraction was in the range of 50%    to 55%. Wall motion was normal; there were no regional wall    motion abnormalities. Doppler parameters are consistent with    abnormal left ventricular relaxation (grade 1 diastolic    dysfunction).  - Aortic valve: There was trivial regurgitation.  - Left atrium: The atrium was mildly dilated.  - Right ventricle: Systolic function was normal.  - Pulmonary arteries: Systolic pressure was within the normal    range.   Impressions:   - No cardiac source of emboli was indentified. __________  2D echo 07/15/2011: - Left ventricle: The cavity size was mildly dilated. There    was mild concentric hypertrophy. Systolic function was    severely reduced. The estimated ejection fraction was in    the range of 20% to 25%. Diffuse hypokinesis. Regional    wall motion abnormalities cannot be excluded. Doppler    parameters are consistent with abnormal left ventricular    relaxation (grade 1 diastolic dysfunction).  - Mitral valve: Mild regurgitation.  - Left atrium: The atrium was moderately dilated.  - Right ventricle: The cavity size was  mildly dilated. Wall    thickness was normal. Systolic function was mildly    reduced.  - Tricuspid valve: Peak RV-RA gradient:37mm Hg (S).  - Pulmonary  arteries: PA peak pressure: 72mm Hg (S).  - Inferior vena cava: The vessel was dilated; the    respirophasic diameter changes were blunted (< 50%);    findings are consistent with elevated central venous    pressure.  Impressions:   - The right ventricular systolic pressure was increased    consistent with severe pulmonary hypertension.     EKG:  EKG is ordered today.  The EKG ordered today demonstrates NSR, 88 bpm, occasional PACs, rare PVC, baseline wandering, nonspecific st/t changes  Recent Labs: 03/29/2023: B Natriuretic Peptide 212.9 04/22/2023: Magnesium 2.5 07/10/2023: ALT 19; Hemoglobin 11.2; Platelet Count 211 08/24/2023: BUN 60; Creatinine, Ser 1.54; Potassium 4.2; Sodium 145  Recent Lipid Panel    Component Value Date/Time   CHOL 134 09/13/2022 0511   TRIG 66 09/13/2022 0511   HDL 37 (L) 09/13/2022 0511   CHOLHDL 3.6 09/13/2022 0511   VLDL 13 09/13/2022 0511   LDLCALC 84 09/13/2022 0511    PHYSICAL EXAM:    VS:  BP (!) 144/90 (BP Location: Left Arm, Patient Position: Sitting, Cuff Size: Normal)   Pulse 88   Wt 247 lb (112 kg)   SpO2 95%   BMI 36.48 kg/m   BMI: Body mass index is 36.48 kg/m.  Physical Exam Vitals reviewed.  Constitutional:      Appearance: She is well-developed.  HENT:     Head: Normocephalic and atraumatic.  Eyes:     General:        Right eye: No discharge.        Left eye: No discharge.  Neck:     Comments: JVD difficult to assess secondary to body habitus. Cardiovascular:     Rate and Rhythm: Normal rate and regular rhythm.     Heart sounds: S1 normal and S2 normal. Heart sounds not distant. No midsystolic click and no opening snap. Murmur heard.     Systolic murmur is present with a grade of 1/6 at the upper right sternal border.     No friction rub.  Pulmonary:     Effort: Pulmonary effort is normal. No respiratory distress.     Breath sounds: Normal breath sounds. No decreased breath sounds, wheezing, rhonchi or rales.   Chest:     Chest wall: No tenderness.  Abdominal:     General: There is no distension.     Palpations: Abdomen is soft.     Tenderness: There is no abdominal tenderness.  Musculoskeletal:     Cervical back: Normal range of motion.     Right lower leg: Edema present.     Left lower leg: Edema present.     Comments: 3+ bilateral lower extremity pitting edema with the lower extremities wrapped in Ace bandage up to the knee.  Skin:    General: Skin is warm and dry.     Nails: There is no clubbing.  Neurological:     Mental Status: She is alert and oriented to person, place, and time.  Psychiatric:        Speech: Speech normal.        Behavior: Behavior normal.        Thought Content: Thought content normal.        Judgment: Judgment normal.     Wt Readings from Last 3 Encounters:  10/01/23 247 lb (  112 kg)  08/24/23 247 lb (112 kg)  06/12/23 191 lb (86.6 kg)     ASSESSMENT & PLAN:   HFimpEF: Volume status is difficult to assess on physical exam secondary to body habitus.  I suspect a significant component of her lower extremity swelling is related to lymphedema and dependent edema.  Currently remains off torsemide and spironolactone secondary to AKI.  Obtain echo to evaluate for new cardiomyopathy.  HTN: Blood pressure is mildly elevated in the office today.  She remains on Imdur.  Blood pressure typically well-controlled.  Monitor.  Lymphedema: Currently unable to use lymphedema pumps secondary to DVT diagnosed in 03/2023.  Recommend leg elevation and ongoing management per PCP and vascular surgery.  AKI: Most recent serum creatinine 1.54 with baseline around 0.6-0.7.  Obtain repeat BMP.  Obesity with OSA: Noncompliant with CPAP.  History of right lower extremity DVT: Status post IVC filter.  Remains on apixaban under the direction of PCP/oncology.  Patient was again advised to follow-up with vascular surgery for discussion of IVC filter removal, as noted in oncology  note.  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     Disposition: F/u with Dr. Mariah Milling or an APP in 3 months.   Medication Adjustments/Labs and Tests Ordered: Current medicines are reviewed at length with the patient today.  Concerns regarding medicines are outlined above. Medication changes, Labs and Tests ordered today are summarized above and listed in the Patient Instructions accessible in Encounters.   Signed, Eula Listen, PA-C 10/01/2023 4:41 PM     [facility] -  [address] [place], [place] [address] [phone]

## 2023-10-01 ENCOUNTER — Encounter: Payer: Self-pay | Admitting: Physician Assistant

## 2023-10-01 ENCOUNTER — Ambulatory Visit: Payer: Medicare Other | Attending: Physician Assistant | Admitting: Physician Assistant

## 2023-10-01 VITALS — BP 144/90 | HR 88 | Wt 247.0 lb

## 2023-10-01 DIAGNOSIS — I5032 Chronic diastolic (congestive) heart failure: Secondary | ICD-10-CM | POA: Diagnosis not present

## 2023-10-01 DIAGNOSIS — I10 Essential (primary) hypertension: Secondary | ICD-10-CM | POA: Diagnosis not present

## 2023-10-01 DIAGNOSIS — I82461 Acute embolism and thrombosis of right calf muscular vein: Secondary | ICD-10-CM

## 2023-10-01 DIAGNOSIS — G4733 Obstructive sleep apnea (adult) (pediatric): Secondary | ICD-10-CM

## 2023-10-01 DIAGNOSIS — C7931 Secondary malignant neoplasm of brain: Secondary | ICD-10-CM

## 2023-10-01 DIAGNOSIS — C50911 Malignant neoplasm of unspecified site of right female breast: Secondary | ICD-10-CM

## 2023-10-01 DIAGNOSIS — I890 Lymphedema, not elsewhere classified: Secondary | ICD-10-CM

## 2023-10-01 DIAGNOSIS — N179 Acute kidney failure, unspecified: Secondary | ICD-10-CM

## 2023-10-01 DIAGNOSIS — I428 Other cardiomyopathies: Secondary | ICD-10-CM | POA: Diagnosis not present

## 2023-10-01 DIAGNOSIS — Z79899 Other long term (current) drug therapy: Secondary | ICD-10-CM

## 2023-10-01 NOTE — Patient Instructions (Addendum)
Medication Instructions:  Your Physician recommend you continue on your current medication as directed.    *If you need a refill on your cardiac medications before your next appointment, please call your pharmacy*   Lab Work: Your provider would like for you to have following labs drawn today BMeT.   If you have labs (blood work) drawn today and your tests are completely normal, you will receive your results only by: MyChart Message (if you have MyChart) OR A paper copy in the mail If you have any lab test that is abnormal or we need to change your treatment, we will call you to review the results.  TESTING/PROCEDURE:   Your physician has requested that you have an echocardiogram. Echocardiography is a painless test that uses sound waves to create images of your heart. It provides your doctor with information about the size and shape of your heart and how well your heart's chambers and valves are working.   You may receive an ultrasound enhancing agent through an IV if needed to better visualize your heart during the echo. This procedure takes approximately one hour.  There are no restrictions for this procedure.  This will take place at [address] [facility]) [address], [place] [address]  Please note: We ask at that you not bring children with you during ultrasound (echo/ vascular) testing. Due to room size and safety concerns, children are not allowed in the ultrasound rooms during exams. Our front office staff cannot provide observation of children in our lobby area while testing is being conducted. An adult accompanying a patient to their appointment will only be allowed in the ultrasound room at the discretion of the ultrasound technician under special circumstances. We apologize for any inconvenience.    Follow-Up: At [facility], you and your health needs are our priority.  As part of our continuing mission to provide you with exceptional heart care, we have  created designated Provider Care Teams.  These Care Teams include your primary Cardiologist (physician) and Advanced Practice Providers (APPs -  Physician Assistants and Nurse Practitioners) who all work together to provide you with the care you need, when you need it.  We recommend signing up for the patient portal called "MyChart".  Sign up information is provided on this After Visit Summary.  MyChart is used to connect with patients for Virtual Visits (Telemedicine).  Patients are able to view lab/test results, encounter notes, upcoming appointments, etc.  Non-urgent messages can be sent to your provider as well.   To learn more about what you can do with MyChart, go to [email or web address].    Your next appointment:   3 month(s)  Provider:   You may see Julien Nordmann, MD or one of the following Advanced Practice Providers on your designated Care Team:   Eula Listen, PA-C  Other Instructions Please schedule appt with [facility] Plumber, NP) lymphedema and to evaluate patient's continued need for IVC filter

## 2023-10-02 ENCOUNTER — Encounter: Payer: Self-pay | Admitting: Emergency Medicine

## 2023-10-02 ENCOUNTER — Other Ambulatory Visit: Payer: Self-pay | Admitting: Emergency Medicine

## 2023-10-02 ENCOUNTER — Telehealth: Payer: Self-pay | Admitting: Physician Assistant

## 2023-10-02 DIAGNOSIS — Z79899 Other long term (current) drug therapy: Secondary | ICD-10-CM

## 2023-10-02 LAB — BASIC METABOLIC PANEL
BUN/Creatinine Ratio: 26 (ref 12–28)
BUN: 25 mg/dL (ref 8–27)
CO2: 27 mmol/L (ref 20–29)
Calcium: 8.5 mg/dL — ABNORMAL LOW (ref 8.7–10.3)
Chloride: 104 mmol/L (ref 96–106)
Creatinine, Ser: 0.97 mg/dL (ref 0.57–1.00)
Glucose: 156 mg/dL — ABNORMAL HIGH (ref 70–99)
Potassium: 5 mmol/L (ref 3.5–5.2)
Sodium: 144 mmol/L (ref 134–144)
eGFR: 60 mL/min/{1.73_m2} (ref >59)

## 2023-10-02 MED ORDER — TORSEMIDE 20 MG PO TABS: 20.0000 mg | ORAL_TABLET | Freq: Every day | ORAL | 3 refills | Status: DC

## 2023-10-02 NOTE — Telephone Encounter (Signed)
See letter and note for this encounter on 10/02/23

## 2023-10-02 NOTE — Telephone Encounter (Signed)
[name] said someone called and left her a message to call back but did not leave a name. She said if you are wanting pt to be seen you can just put in a referral and they will call her.

## 2023-10-14 ENCOUNTER — Ambulatory Visit
Admission: RE | Admit: 2023-10-14 | Discharge: 2023-10-14 | Disposition: A | Discharge status: Home / Self Care | Payer: Medicare Other | Source: Ambulatory Visit | Attending: Oncology | Admitting: Oncology

## 2023-10-14 ENCOUNTER — Ambulatory Visit
Admission: RE | Admit: 2023-10-14 | Discharge: 2023-10-14 | Disposition: A | Discharge status: Home / Self Care | Payer: Medicare Other | Source: Ambulatory Visit | Attending: Oncology

## 2023-10-14 DIAGNOSIS — C50911 Malignant neoplasm of unspecified site of right female breast: Secondary | ICD-10-CM | POA: Diagnosis present

## 2023-10-14 DIAGNOSIS — C50919 Malignant neoplasm of unspecified site of unspecified female breast: Secondary | ICD-10-CM | POA: Insufficient documentation

## 2023-10-14 DIAGNOSIS — C7931 Secondary malignant neoplasm of brain: Secondary | ICD-10-CM | POA: Insufficient documentation

## 2023-10-14 MED ORDER — IOHEXOL 300 MG/ML  SOLN
100.0000 mL | Freq: Once | INTRAMUSCULAR | Status: AC | PRN
  Administered 2023-10-14: 100 mL via INTRAVENOUS

## 2023-10-14 MED ORDER — GADOBUTROL 1 MMOL/ML IV SOLN
10.0000 mL | Freq: Once | INTRAVENOUS | Status: AC | PRN
  Administered 2023-10-14: 10 mL via INTRAVENOUS

## 2023-10-20 ENCOUNTER — Ambulatory Visit: Payer: Medicare Other | Attending: Physician Assistant

## 2023-10-20 DIAGNOSIS — I428 Other cardiomyopathies: Secondary | ICD-10-CM | POA: Diagnosis not present

## 2023-10-20 LAB — ECHOCARDIOGRAM COMPLETE
AV Mean grad: 4.7 mm[Hg]
AV Peak grad: 8.4 mm[Hg]
Ao pk vel: 1.45 m/s
Area-P 1/2: 3.53 cm2

## 2023-10-22 ENCOUNTER — Other Ambulatory Visit (HOSPITAL_COMMUNITY): Payer: Self-pay

## 2023-10-23 ENCOUNTER — Ambulatory Visit (INDEPENDENT_AMBULATORY_CARE_PROVIDER_SITE_OTHER): Payer: Medicare Other | Admitting: Nurse Practitioner

## 2023-10-30 ENCOUNTER — Encounter: Payer: Self-pay | Admitting: Oncology

## 2023-10-30 ENCOUNTER — Inpatient Hospital Stay: Payer: Medicare Other | Admitting: Oncology

## 2023-10-30 ENCOUNTER — Inpatient Hospital Stay: Payer: Medicare Other | Attending: Oncology

## 2023-10-30 VITALS — BP 135/82 | HR 77 | Temp 98.3°F | Resp 20

## 2023-10-30 DIAGNOSIS — Z7901 Long term (current) use of anticoagulants: Secondary | ICD-10-CM | POA: Diagnosis not present

## 2023-10-30 DIAGNOSIS — C50911 Malignant neoplasm of unspecified site of right female breast: Secondary | ICD-10-CM

## 2023-10-30 DIAGNOSIS — C50919 Malignant neoplasm of unspecified site of unspecified female breast: Secondary | ICD-10-CM

## 2023-10-30 DIAGNOSIS — Z171 Estrogen receptor negative status [ER-]: Secondary | ICD-10-CM | POA: Diagnosis not present

## 2023-10-30 DIAGNOSIS — Z17421 Hormone receptor negative with human epidermal growth factor receptor 2 negative status: Secondary | ICD-10-CM | POA: Diagnosis not present

## 2023-10-30 DIAGNOSIS — Z08 Encounter for follow-up examination after completed treatment for malignant neoplasm: Secondary | ICD-10-CM

## 2023-10-30 DIAGNOSIS — Z79811 Long term (current) use of aromatase inhibitors: Secondary | ICD-10-CM | POA: Diagnosis not present

## 2023-10-30 DIAGNOSIS — C7931 Secondary malignant neoplasm of brain: Secondary | ICD-10-CM | POA: Insufficient documentation

## 2023-10-30 DIAGNOSIS — D649 Anemia, unspecified: Secondary | ICD-10-CM

## 2023-10-30 DIAGNOSIS — Z5181 Encounter for therapeutic drug level monitoring: Secondary | ICD-10-CM | POA: Diagnosis not present

## 2023-10-30 LAB — CBC WITH DIFFERENTIAL/PLATELET
Abs Immature Granulocytes: 0.35 10*3/uL — ABNORMAL HIGH (ref 0.00–0.07)
Basophils Absolute: 0.1 10*3/uL (ref 0.0–0.1)
Basophils Relative: 0 %
Eosinophils Absolute: 0 10*3/uL (ref 0.0–0.5)
Eosinophils Relative: 0 %
HCT: 37.1 % (ref 36.0–46.0)
Hemoglobin: 11.6 g/dL — ABNORMAL LOW (ref 12.0–15.0)
Immature Granulocytes: 3 %
Lymphocytes Relative: 4 %
Lymphs Abs: 0.6 10*3/uL — ABNORMAL LOW (ref 0.7–4.0)
MCH: 29.4 pg (ref 26.0–34.0)
MCHC: 31.3 g/dL (ref 30.0–36.0)
MCV: 94.2 fL (ref 80.0–100.0)
Monocytes Absolute: 0.3 10*3/uL (ref 0.1–1.0)
Monocytes Relative: 2 %
Neutro Abs: 12.3 10*3/uL — ABNORMAL HIGH (ref 1.7–7.7)
Neutrophils Relative %: 91 %
Platelets: 369 10*3/uL (ref 150–400)
RBC: 3.94 MIL/uL (ref 3.87–5.11)
RDW: 14.6 % (ref 11.5–15.5)
WBC: 13.6 10*3/uL — ABNORMAL HIGH (ref 4.0–10.5)
nRBC: 0.1 % (ref 0.0–0.2)

## 2023-10-30 LAB — COMPREHENSIVE METABOLIC PANEL
ALT: 17 U/L (ref 0–44)
AST: 18 U/L (ref 15–41)
Albumin: 2.6 g/dL — ABNORMAL LOW (ref 3.5–5.0)
Alkaline Phosphatase: 51 U/L (ref 38–126)
Anion gap: 13 (ref 5–15)
BUN: 30 mg/dL — ABNORMAL HIGH (ref 8–23)
CO2: 30 mmol/L (ref 22–32)
Calcium: 8.2 mg/dL — ABNORMAL LOW (ref 8.9–10.3)
Chloride: 99 mmol/L (ref 98–111)
Creatinine, Ser: 0.77 mg/dL (ref 0.44–1.00)
GFR, Estimated: >60 mL/min (ref >60)
Glucose, Bld: 200 mg/dL — ABNORMAL HIGH (ref 70–99)
Potassium: 3.6 mmol/L (ref 3.5–5.1)
Sodium: 142 mmol/L (ref 135–145)
Total Bilirubin: 0.6 mg/dL (ref 0.0–1.2)
Total Protein: 6.2 g/dL — ABNORMAL LOW (ref 6.5–8.1)

## 2023-10-30 LAB — FERRITIN: Ferritin: 217 ng/mL (ref 11–307)

## 2023-10-30 LAB — IRON AND TIBC
Iron: 51 ug/dL (ref 28–170)
Saturation Ratios: 18 % (ref 10.4–31.8)
TIBC: 288 ug/dL (ref 250–450)
UIBC: 237 ug/dL

## 2023-10-30 LAB — VITAMIN B12: Vitamin B-12: 1043 pg/mL — ABNORMAL HIGH (ref 180–914)

## 2023-10-30 NOTE — Progress Notes (Signed)
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  Mammogram and ultrasound in August 2019 showed 0.9 x 0.9 x 0.8 cm irregular spiculated mass in the 11 o'clock position of the right breast.  There was a concern for an abnormal intramammary lymph node as well.  No other abnormal axillary adenopathy noted.  Patient underwent ultrasound-guided biopsy of both the right breast mass as well as the axillary lymph node lymph node biopsy was negative for malignancy.  Biopsy of the right breast mass showed 6 mm, grade 1 ER PR positive and HER-2/neu negative tumor.    Final pathology showed 7 mm grade 2 invasive mammary carcinoma ER PR positive her 2 negative. Negative nodes. Clear margins. Oncotype score came as low risk 8 and she does not require adjuvant chemotherapy. She completed adjuvant radiation and started Arimidex in December 2019.   Patient was admitted to the hospital in July 2024 with symptoms of right facial droop. 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 This area was not deemed to be  resectable or possible to biopsy. Patient was seen by radiation oncology for empiric radiation. Also found to have right posterior tibial vein acute DVT during the hospitalization.  She is on Eliquis for the same CT chest abdomen pelvis without contrast did not show any evidence of metastatic disease.  Patient continues to remain on Arimidex at this time  Interval history-she is residing at [facility] and has really not been able to ambulate.  She is wheelchair-bound.  She has chronic bilateral lower extremity wounds.  ECOG PS- 3 Pain scale- 4   Review of systems- Review of Systems  Constitutional:  Positive for malaise/fatigue. Negative for chills, fever and weight loss.  HENT:  Negative for congestion, ear discharge and nosebleeds.   Eyes:  Negative for blurred vision.  Respiratory:  Negative for cough, hemoptysis, sputum production, shortness of breath and wheezing.   Cardiovascular:  Negative for chest pain, palpitations, orthopnea and claudication.  Gastrointestinal:  Negative for abdominal pain, blood in stool, constipation, diarrhea, heartburn, melena, nausea and vomiting.  Genitourinary:  Negative for dysuria, flank pain, frequency, hematuria and urgency.  Musculoskeletal:  Negative for back pain, joint pain and myalgias.  Skin:  Negative for rash.  Neurological:  Negative for dizziness, tingling, focal weakness, seizures, weakness and headaches.  Endo/Heme/Allergies:  Does not bruise/bleed easily.  Psychiatric/Behavioral:  Negative for depression and suicidal ideas. The patient does not have insomnia.       No Known Allergies   Past Medical History:  Diagnosis Date   Abnormality of gait    Acute on chronic diastolic CHF (congestive heart failure) (HCC) 11/20/2017  Acute respiratory failure with hypoxia (HCC) 06/13/2022   AKI (acute kidney injury) (HCC) 03/03/2022   Breast cancer (HCC) 02/05/2005   LEFT 3.5 cm invasive mammary carcinoma, T2, N0,; triple negative, whole  breast radiation, cytoxan, taxotere chemotherapy.    Breast cancer (HCC) 2019   right breast   Chronic kidney disease, stage 3b (HCC) 09/10/2022   Complication of anesthesia 2006   pt states "hard to wake up" after L breast surgery    DVT (deep venous thrombosis) (HCC)    a. 03/2023 R PT Vein DVT following abd surgery-->s/p IVC filter.   Edema    GERD (gastroesophageal reflux disease)    Glucose intolerance 09/10/2022   Headache    Heart failure with improved ejection fraction (HFimpEF) (HCC)    a. 07/2011 Echo: EF 20-25%; b. 03/2017 Echo: EF 50-55%; c. 02/2022 Echo: EF 50-55%. mild LVH, GrI DD, Nl RV fxn, mild BAE, mild MR, mild AoV sclerosis, mild AI.   Heart murmur    Hypertension    Hypertensive emergency 06/13/2022   Lower limb ulcer, calf, left, limited to breakdown of skin (HCC) 12/11/2020   Malignant neoplasm of breast (female), unspecified site 06/14/2018   RIGHT T1b, N0; ER/PR positive, HER-2 negative.   Neuropathy    Obesity    Pain in joint, site unspecified    Personal history of chemotherapy 2006   left breast   Personal history of radiation therapy 2019   right breast   Personal history of radiation therapy 2006   left breast   Pneumonia    Pyuria 08/22/2022   Sleep apnea    Syncope 03/03/2022   Tinea pedis    Unspecified hearing loss    bilateral   Weakness 03/03/2022     Past Surgical History:  Procedure Laterality Date   BREAST BIOPSY Right 2019   11 oc-INVASIVE MAMMARY CARCINOMA, NO SPECIAL TYPE   BREAST BIOPSY Right 2019   1030 oc- neg   BREAST LUMPECTOMY  2006   left breast    CATARACT EXTRACTION     left eye   HERNIA REPAIR  05/25/2006   Incarcerated omentum in ventral hernia, Ventralight mesh, combined lap/ open with omental resection.    IVC FILTER INSERTION N/A 03/30/2023   Procedure: IVC FILTER INSERTION;  Surgeon: Annice Needy, MD;  Location: [facility];  Service: Cardiovascular;  Laterality: N/A;   LAPAROTOMY N/A 04/20/2023    Procedure: EXPLORATORY LAPAROTOMY, LYSIS OF ADHESIONS WITH  MESSENTARIC BIOPSY AND REMOVAL OF MESH WITH METALLIC TACKS;  Surgeon: Carolan Shiver, MD;  Location: [facility] OR[facility];  Service: General;  Laterality: N/A;   PARTIAL MASTECTOMY WITH NEEDLE LOCALIZATION Right 06/14/2018   Procedure: PARTIAL MASTECTOMY WITH NEEDLE LOCALIZATION;  Surgeon: Earline Mayotte, MD;  Location: [facility] OR[facility];  Service: General;  Laterality: Right;   PORT-A-CATH REMOVAL  05/25/2006   SENTINEL NODE BIOPSY Right 06/14/2018   Procedure: SENTINEL NODE BIOPSY;  Surgeon: Earline Mayotte, MD;  Location: [facility] OR[facility];  Service: General;  Laterality: Right;   TUBAL LIGATION      Social History   Socioeconomic History   Marital status: Divorced    Spouse name: Not on file   Number of children: Not on file   Years of education: Not on file   Highest education level: Not on file  Occupational History   Occupation: retired  Tobacco Use   Smoking status: Former    Current packs/day: 0.00    Average packs/day: 0.5 packs/day for 30.0 years (15.0  ttl pk-yrs)    Types: Cigarettes    Start date: 07/14/1970    Quit date: 07/14/2000    Years since quitting: 23.3   Smokeless tobacco: Never  Vaping Use   Vaping status: Never Used  Substance and Sexual Activity   Alcohol use: No   Drug use: Not Currently    Types: Marijuana    Comment: endorses prior marijuana use 1999   Sexual activity: Never  Other Topics Concern   Not on file  Social History Narrative   Not on file   Social Drivers of Health   Financial Resource Strain: Not on file  Food Insecurity: No Food Insecurity (04/18/2023)   Hunger Vital Sign    Worried About Running Out of Food in the Last Year: Never true    Ran Out of Food in the Last Year: Never true  Transportation Needs: No Transportation Needs (04/18/2023)   PRAPARE - Administrator, Civil Service (Medical): No    Lack of Transportation (Non-Medical): No  Physical Activity: Not on file   Stress: Not on file  Social Connections: Not on file  Intimate Partner Violence: Not At Risk (04/18/2023)   Humiliation, Afraid, Rape, and Kick questionnaire    Fear of Current or Ex-Partner: No    Emotionally Abused: No    Physically Abused: No    Sexually Abused: No    Family History  Problem Relation Age of Onset   Hypertension Mother        deceased 58   Cataracts Mother    Breast cancer Other 56       maternal half-sister; deceased 46   Coronary artery disease Other      Current Outpatient Medications:    acetaminophen (TYLENOL) 325 MG tablet, Take 2 tablets (650 mg total) by mouth every 6 (six) hours as needed for mild pain (or Fever >/= 101)., Disp: , Rfl:    anastrozole (ARIMIDEX) 1 MG tablet, Take 1 tablet (1 mg total) by mouth daily., Disp: 30 tablet, Rfl: 6   ascorbic acid (VITAMIN C) 500 MG tablet, Take 1 tablet by mouth 2 (two) times daily., Disp: , Rfl:    Calcium Carbonate-Vitamin D 600-5 MG-MCG TABS, Take 1 tablet by mouth 2 (two) times daily., Disp: , Rfl:    dexamethasone (DECADRON) 4 MG tablet, Take 1 tablet (4 mg total) by mouth 2 (two) times daily with a meal., Disp: , Rfl:    diclofenac Sodium (VOLTAREN) 1 % GEL, Apply 2 g topically 2 (two) times daily as needed., Disp: , Rfl:    ELIQUIS 5 MG TABS tablet, Take 5 mg by mouth 2 (two) times daily., Disp: , Rfl:    Emollient (EUCERIN) lotion, Apply topically 2 (two) times daily. Apply to dry skin especially bilateral lower extremities and arms., Disp: 240 mL, Rfl: 0   empagliflozin (JARDIANCE) 10 MG TABS tablet, Take 1 tablet (10 mg total) by mouth daily before breakfast., Disp: 30 tablet, Rfl: 0   HYDROcodone-acetaminophen (NORCO/VICODIN) 5-325 MG tablet, Take 1 tablet by mouth every 6 (six) hours as needed., Disp: 16 tablet, Rfl: 0   Incontinence Supply Disposable (BLADDER CONTROL PADS EX ABSORB) MISC, Apply 1 Dose topically as needed. , Disp: , Rfl:    ipratropium-albuterol (DUONEB) 0.5-2.5 (3) MG/3ML SOLN, Take  3 mLs by nebulization every 4 (four) hours as needed (wheezing)., Disp: , Rfl:    isosorbide mononitrate (IMDUR) 60 MG 24 hr tablet, Take 1 tablet (60 mg total) by mouth 2 (two)  times daily., Disp: 60 tablet, Rfl: 0   lactulose (CHRONULAC) 10 GM/15ML solution, SMARTSIG:Milliliter(s) By Mouth, Disp: , Rfl:    latanoprost (XALATAN) 0.005 % ophthalmic solution, Apply to eye., Disp: , Rfl:    lidocaine (LIDODERM) 5 %, 2 patches daily., Disp: , Rfl:    loratadine (CLARITIN) 10 MG tablet, Take 10 mg by mouth daily., Disp: , Rfl:    ondansetron (ZOFRAN-ODT) 4 MG disintegrating tablet, Take 4 mg by mouth every 8 (eight) hours as needed., Disp: , Rfl:    oxybutynin (DITROPAN) 5 MG tablet, Take 5 mg by mouth 2 (two) times daily., Disp: , Rfl:    pantoprazole (PROTONIX) 40 MG tablet, Take 1 tablet (40 mg total) by mouth 2 (two) times daily., Disp: 60 tablet, Rfl: 0   polyethylene glycol (MIRALAX / GLYCOLAX) 17 g packet, Take 17 g by mouth daily. Skip the dose if no constipation, Disp: 14 each, Rfl: 0   pregabalin (LYRICA) 75 MG capsule, Take 75 mg by mouth daily., Disp: , Rfl:    sennosides-docusate sodium (SENOKOT-S) 8.6-50 MG tablet, Take 1 tablet by mouth 2 (two) times daily., Disp: , Rfl:    torsemide (DEMADEX) 20 MG tablet, Take 1 tablet (20 mg total) by mouth daily., Disp: 90 tablet, Rfl: 3   vitamin B-12 (CYANOCOBALAMIN) 500 MCG tablet, Take 500 mcg by mouth daily., Disp: , Rfl:    Vitamin D, Ergocalciferol, (DRISDOL) 1.25 MG (50000 UNIT) CAPS capsule, Take 50,000 Units by mouth once a week., Disp: , Rfl:    Zinc Oxide (TRIPLE PASTE) 12.8 % ointment, Apply barrier cream to buttocks every shift., Disp: , Rfl:    doxycycline (VIBRA-TABS) 100 MG tablet, Take 1 tablet (100 mg total) by mouth 2 (two) times daily. (Patient not taking: Reported on 10/30/2023), Disp: 14 tablet, Rfl: 0   feeding supplement (ENSURE ENLIVE / ENSURE PLUS) LIQD, Take 237 mLs by mouth daily. (Patient not taking: Reported on  10/30/2023), Disp: 14220 mL, Rfl: 0   guaiFENesin (MUCINEX) 600 MG 12 hr tablet, Take 600 mg by mouth every 12 (twelve) hours. (Patient not taking: Reported on 10/01/2023), Disp: , Rfl:    neomycin-polymyxin b-dexamethasone (MAXITROL) 3.5-10000-0.1 SUSP, Apply to eye. (Patient not taking: Reported on 10/30/2023), Disp: , Rfl:   Physical exam:  Vitals:   10/30/23 1434  BP: 135/82  Pulse: 77  Resp: 20  Temp: 98.3 F (36.8 C)  SpO2: 100%   Physical Exam Constitutional:      Comments: She is obese.  Sitting in a wheelchair and appears in no acute distress  Cardiovascular:     Rate and Rhythm: Normal rate and regular rhythm.     Heart sounds: Normal heart sounds.  Pulmonary:     Effort: Pulmonary effort is normal.     Breath sounds: Normal breath sounds.  Abdominal:     General: Bowel sounds are normal.     Palpations: Abdomen is soft.  Musculoskeletal:     Comments: Bilateral Ace wrap in place over lower extremities  Skin:    General: Skin is warm and dry.  Neurological:     Mental Status: She is alert and oriented to person, place, and time.   Breast exam is somewhat limited as patient is unable to get up from her wheelchair.  No palpable breast masses are bilateral axillary adenopathy     Latest Ref Rng & Units 10/30/2023    2:03 PM  CMP  Glucose 70 - 99 mg/dL 161   BUN 8 -  23 mg/dL 30   Creatinine 1.91 - 1.00 mg/dL 4.78   Sodium 295 - 621 mmol/L 142   Potassium 3.5 - 5.1 mmol/L 3.6   Chloride 98 - 111 mmol/L 99   CO2 22 - 32 mmol/L 30   Calcium 8.9 - 10.3 mg/dL 8.2   Total Protein 6.5 - 8.1 g/dL 6.2   Total Bilirubin 0.0 - 1.2 mg/dL 0.6   Alkaline Phos 38 - 126 U/L 51   AST 15 - 41 U/L 18   ALT 0 - 44 U/L 17       Latest Ref Rng & Units 10/30/2023    2:03 PM  CBC  WBC 4.0 - 10.5 K/uL 13.6   Hemoglobin 12.0 - 15.0 g/dL 30.8   Hematocrit 65.7 - 46.0 % 37.1   Platelets 150 - 400 K/uL 369     No images are attached to the encounter.  MR Brain W Wo  Contrast Result Date: 10/28/2023 CLINICAL DATA:  Breast carcinoma.  Right facial droop EXAM: MRI HEAD WITHOUT AND WITH CONTRAST TECHNIQUE: Multiplanar, multiecho pulse sequences of the brain and surrounding structures were obtained without and with intravenous contrast. CONTRAST:  10mL GADAVIST GADOBUTROL 1 MMOL/ML IV SOLN COMPARISON:  03/29/2023 FINDINGS: Brain: No acute infarct, mass effect or extra-axial collection. No acute or chronic hemorrhage. There is multifocal hyperintense T2-weighted signal within the white matter. Generalized volume loss. Normal midline structures. 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 Previously seen enhancing lesion of the right cingulate gyrus has resolved. No abnormal parenchymal contrast enhancement. Vascular: Normal flow voids. Skull and upper cervical spine: Normal calvarium and skull base. Visualized upper cervical spine and soft tissues are normal. Sinuses/Orbits:No paranasal sinus fluid levels or advanced mucosal thickening. No mastoid or middle ear effusion. Normal orbits. 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 Left ocular lens replacement. IMPRESSION: 1. No acute intracranial abnormality. 2. Previously seen enhancing lesion of the right cingulate gyrus has resolved. No new metastatic disease. 3. 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 Electronically Signed   By: Deatra Robinson M.D.   On: 10/28/2023 21:31   CT CHEST ABDOMEN PELVIS W CONTRAST Result Date: 10/28/2023 CLINICAL DATA:  Breast carcinoma with brain metastasis. Restaging. * Tracking Code: BO * EXAM: CT CHEST, ABDOMEN, AND PELVIS WITH CONTRAST TECHNIQUE: Multidetector CT imaging of the chest, abdomen and pelvis was performed following the standard protocol during bolus administration of intravenous contrast. RADIATION DOSE REDUCTION: This exam was performed according to the departmental dose-optimization program which includes automated exposure control, adjustment of  the mA and/or kV according to patient size and/or use of iterative reconstruction technique. CONTRAST:  OMNIPAQUE IOHEXOL 300 MG/ML  SOLN COMPARISON:  03/29/2023 FINDINGS: CT CHEST FINDINGS Cardiovascular: No acute findings. Mediastinum/Lymph Nodes: No pathologically enlarged lymph nodes identified. Stable multinodular goiter with substernal extension. Lungs/Pleura: No suspicious pulmonary nodules or masses identified. No evidence of infiltrate or pleural effusion. Musculoskeletal:  No suspicious bone lesions identified. CT ABDOMEN AND PELVIS FINDINGS Hepatobiliary: No masses identified. Stable tiny cyst in central left hepatic lobe. Gallbladder is unremarkable. No evidence of biliary ductal dilatation. Pancreas:  No mass or inflammatory changes. Spleen:  Within normal limits in size and appearance. Adrenals/Urinary tract: Normal adrenal glands. Stable right renal cysts. No suspicious masses or hydronephrosis. Stomach/Bowel: Tiny hiatal hernia again seen. No evidence of obstruction, inflammatory process, or abnormal fluid collections. Vascular/Lymphatic: No pathologically enlarged lymph nodes identified. No acute vascular findings. IVC filter seen in expected position. Reproductive: Multiple  small uterine fibroids show no significant change. Adnexal regions are unremarkable. Other:  None. Musculoskeletal: No suspicious bone lesions identified. Severe bilateral hip osteoarthritis noted, right side greater than left. IMPRESSION: Stable exam. No evidence of metastatic disease within the chest, abdomen, or pelvis. Stable multinodular goiter with substernal extension. Stable small uterine fibroids. Tiny hiatal hernia. Severe bilateral hip osteoarthritis. Electronically Signed   By: Danae Orleans M.D.   On: 10/28/2023 18:57   ECHOCARDIOGRAM COMPLETE Result Date: 10/20/2023    ECHOCARDIOGRAM REPORT   Patient Name:   SANJNA HASKEW Date of Exam: 10/20/2023 Medical Rec #:  [id number]        Height:       69.0 in  Accession #:    [id number]       Weight:       247.0 lb Date of Birth:  01-19-1946        BSA:          2.260 m Patient Age:    77 years         BP:           144/90 mmHg Patient Gender: F                HR:           95 bpm. 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 CHF and Cardiomyopathy, TIA and PAD,                 Signs/Symptoms:Syncope; Risk Factors:Sleep Apnea, Hypertension                 and Former Smoker.  Sonographer:    Ilda Mori MHA, BS, RDCS Referring Phys: [id number] Sondra Barges  Sonographer Comments: Technically challenging study due to limited acoustic windows and patient is obese. Image acquisition challenging due to patient body habitus. VERY TDS, due to pt unable to get onto bed without a lift. IMPRESSIONS  1. Left ventricular ejection fraction, by estimation, is 40 to 45%. The left ventricle has mildly decreased function. The left ventricle demonstrates global hypokinesis. Left ventricular diastolic parameters are consistent with Grade I diastolic dysfunction (impaired relaxation).  2. Right ventricular systolic function is normal. The right ventricular size is normal.  3. The mitral valve is normal in structure. Mild mitral valve regurgitation. No evidence of mitral stenosis.  4. The aortic valve has an indeterminant number of cusps. Aortic valve regurgitation is not visualized. No aortic stenosis is present.  5. The inferior vena cava is normal in size with greater than 50% respiratory variability, suggesting right atrial pressure of 3 mmHg. Comparison(s): Pt unable to move from wheelchair to bed for exam. FINDINGS  Left Ventricle: Left ventricular ejection fraction, by estimation, is 40 to 45%. The left ventricle has mildly decreased function. The left ventricle demonstrates global  hypokinesis. The left ventricular internal cavity size was normal in size. There is  no left ventricular hypertrophy. Left ventricular diastolic parameters are consistent with Grade I diastolic dysfunction (impaired relaxation). Right Ventricle: The right ventricular size is normal. No increase in right ventricular wall thickness. Right ventricular systolic function is normal. Left Atrium: Left atrial size was normal in size. Right Atrium: Right atrial size was normal in  size. Pericardium: There is no evidence of pericardial effusion. Mitral Valve: The mitral valve is normal in structure. Mild mitral valve regurgitation. No evidence of mitral valve stenosis. Tricuspid Valve: The tricuspid valve is normal in structure. Tricuspid valve regurgitation is not demonstrated. No evidence of tricuspid stenosis. Aortic Valve: The aortic valve has an indeterminant number of cusps. Aortic valve regurgitation is not visualized. No aortic stenosis is present. Aortic valve mean gradient measures 4.7 mmHg. Aortic valve peak gradient measures 8.4 mmHg. Pulmonic Valve: The pulmonic valve was normal in structure. Pulmonic valve regurgitation is mild. No evidence of pulmonic stenosis. Aorta: The aortic root is normal in size and structure. Venous: The inferior vena cava is normal in size with greater than 50% respiratory variability, suggesting right atrial pressure of 3 mmHg. IAS/Shunts: No atrial level shunt detected by color flow Doppler.   Diastology LV e' medial:    4.24 cm/s LV E/e' medial:  8.5 LV e' lateral:   12.00 cm/s LV E/e' lateral: 3.0  RIGHT VENTRICLE RV S prime:     21.60 cm/s AORTIC VALVE AV Vmax:           144.67 cm/s AV Vmean:          97.633 cm/s AV VTI:            0.215 m AV Peak Grad:      8.4 mmHg AV Mean Grad:      4.7 mmHg LVOT Vmax:         89.43 cm/s LVOT Vmean:        59.400 cm/s LVOT VTI:          0.150 m LVOT/AV VTI ratio: 0.70  AORTA Ao Asc diam: 3.40 cm MITRAL VALVE MV Area (PHT): 3.53 cm    SHUNTS MV  Decel Time: 215 msec    Systemic VTI: 0.15 m MV E velocity: 36.20 cm/s MV A velocity: 73.90 cm/s MV E/A ratio:  0.49 Julien Nordmann MD Electronically signed by Julien Nordmann MD Signature Date/Time: 10/20/2023/4:34:53 PM    Final      Assessment and plan- Patient is a 78 y.o. female with history of stage I ER/PR positive HER2 negative breast cancer diagnosed in August 2019 s/p surgery and adjuvant radiation therapy and currently on Arimidex.  She was thought to have possible brain metastases as an isolated lesion in July 2024 for which she underwent empiric radiation.  This is a routine follow-up visit  13 mm brain lesion which was found in July 2024 on brain MRI was not amenable to biopsy or resection in the For patient underwent empiric radiation to that area assuming that it was possible breast cancer.  MRI brain that was repeated today does not show any further enhancing lesions.  Findings of chronic small vessel ischemia.  CT chest abdomen and pelvis with contrast also shows no evidence of metastatic disease in the chest abdomen or pelvis.  Plan is for continuing single agent Arimidex at this time and I will see her back in 6 months  Patient was found to have right posterior vein DVT in July 2024.  Given her obesity and lack of mobility she is at an increased risk of further clots and therefore will continue with Eliquis at this time   Visit Diagnosis 1. Encounter for follow-up surveillance of breast cancer   2. Visit for monitoring Arimidex therapy   3. Current use of long term anticoagulation      Dr. Owens Shark, MD, MPH [facility] at [facility]  [facility] [phone] 10/30/2023 4:12 PM

## 2023-11-06 ENCOUNTER — Emergency Department
Admission: EM | Admit: 2023-11-06 | Discharge: 2023-11-06 | Disposition: A | Discharge status: Other Acute Inpatient Hospital | Payer: Medicare Other | Attending: Emergency Medicine | Admitting: Emergency Medicine

## 2023-11-06 ENCOUNTER — Other Ambulatory Visit: Payer: Self-pay

## 2023-11-06 DIAGNOSIS — T2123XA Burn of second degree of upper back, initial encounter: Secondary | ICD-10-CM | POA: Insufficient documentation

## 2023-11-06 DIAGNOSIS — L089 Local infection of the skin and subcutaneous tissue, unspecified: Secondary | ICD-10-CM

## 2023-11-06 DIAGNOSIS — Z853 Personal history of malignant neoplasm of breast: Secondary | ICD-10-CM | POA: Insufficient documentation

## 2023-11-06 DIAGNOSIS — I130 Hypertensive heart and chronic kidney disease with heart failure and stage 1 through stage 4 chronic kidney disease, or unspecified chronic kidney disease: Secondary | ICD-10-CM | POA: Diagnosis not present

## 2023-11-06 DIAGNOSIS — X110XXA Contact with hot water in bath or tub, initial encounter: Secondary | ICD-10-CM | POA: Diagnosis not present

## 2023-11-06 DIAGNOSIS — T310 Burns involving less than 10% of body surface: Secondary | ICD-10-CM

## 2023-11-06 DIAGNOSIS — N189 Chronic kidney disease, unspecified: Secondary | ICD-10-CM | POA: Insufficient documentation

## 2023-11-06 DIAGNOSIS — I509 Heart failure, unspecified: Secondary | ICD-10-CM | POA: Diagnosis not present

## 2023-11-06 LAB — BASIC METABOLIC PANEL
Anion gap: 12 (ref 5–15)
BUN: 32 mg/dL — ABNORMAL HIGH (ref 8–23)
CO2: 30 mmol/L (ref 22–32)
Calcium: 8.3 mg/dL — ABNORMAL LOW (ref 8.9–10.3)
Chloride: 96 mmol/L — ABNORMAL LOW (ref 98–111)
Creatinine, Ser: 1.09 mg/dL — ABNORMAL HIGH (ref 0.44–1.00)
GFR, Estimated: 52 mL/min — ABNORMAL LOW (ref >60)
Glucose, Bld: 244 mg/dL — ABNORMAL HIGH (ref 70–99)
Potassium: 4.1 mmol/L (ref 3.5–5.1)
Sodium: 138 mmol/L (ref 135–145)

## 2023-11-06 LAB — RESP PANEL BY RT-PCR (RSV, FLU A&B, COVID)  RVPGX2
Influenza A by PCR: NEGATIVE
Influenza B by PCR: NEGATIVE
Resp Syncytial Virus by PCR: NEGATIVE
SARS Coronavirus 2 by RT PCR: NEGATIVE

## 2023-11-06 LAB — CBC
HCT: 38.3 % (ref 36.0–46.0)
Hemoglobin: 11.9 g/dL — ABNORMAL LOW (ref 12.0–15.0)
MCH: 29.4 pg (ref 26.0–34.0)
MCHC: 31.1 g/dL (ref 30.0–36.0)
MCV: 94.6 fL (ref 80.0–100.0)
Platelets: 313 10*3/uL (ref 150–400)
RBC: 4.05 MIL/uL (ref 3.87–5.11)
RDW: 14.6 % (ref 11.5–15.5)
WBC: 11.9 10*3/uL — ABNORMAL HIGH (ref 4.0–10.5)
nRBC: 0.2 % (ref 0.0–0.2)

## 2023-11-06 LAB — LACTIC ACID, PLASMA
Lactic Acid, Venous: 2.5 mmol/L (ref 0.5–1.9)
Lactic Acid, Venous: 2.7 mmol/L (ref 0.5–1.9)

## 2023-11-06 LAB — BRAIN NATRIURETIC PEPTIDE: B Natriuretic Peptide: 30.5 pg/mL (ref 0.0–100.0)

## 2023-11-06 MED ORDER — MORPHINE SULFATE (PF) 4 MG/ML IV SOLN
4.0000 mg | Freq: Once | INTRAVENOUS | Status: AC
  Administered 2023-11-06: 4 mg via INTRAVENOUS
  Filled 2023-11-06: qty 1

## 2023-11-06 MED ORDER — PIPERACILLIN-TAZOBACTAM 3.375 G IVPB
3.3750 g | Freq: Once | INTRAVENOUS | Status: AC
  Administered 2023-11-06: 3.375 g via INTRAVENOUS
  Filled 2023-11-06: qty 50

## 2023-11-06 MED ORDER — SODIUM CHLORIDE 0.9 % IV BOLUS: 500.0000 mL | Freq: Once | INTRAVENOUS | Status: DC

## 2023-11-06 MED ORDER — ONDANSETRON HCL 4 MG/2ML IJ SOLN
4.0000 mg | Freq: Once | INTRAMUSCULAR | Status: AC
  Administered 2023-11-06: 4 mg via INTRAVENOUS
  Filled 2023-11-06: qty 2

## 2023-11-06 MED ORDER — SODIUM CHLORIDE 0.9 % IV BOLUS
1000.0000 mL | Freq: Once | INTRAVENOUS | Status: AC
  Administered 2023-11-06: 1000 mL via INTRAVENOUS
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
## 2023-11-06 NOTE — ED Notes (Signed)
 Wound dressings have been changed. A wet to dry dressing on all burn spots of the patient's back have been cleaned and dressed. Pt tolerated well

## 2023-11-06 NOTE — ED Notes (Signed)
 Pt dressing was saturated. New dressing applied.
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
## 2023-11-06 NOTE — ED Notes (Signed)
 Provided update to pts daughter at this time. Angelica Pou [phone]

## 2023-11-06 NOTE — ED Notes (Signed)
 [facility] spoke with rep. Manisha to set up transportation, she put the pt on the list to be picked up, but she could not give [place] an ETA at the moment. [facility] stated they have no truck available for pick up's tonight.
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
## 2023-11-09 ENCOUNTER — Ambulatory Visit (INDEPENDENT_AMBULATORY_CARE_PROVIDER_SITE_OTHER): Payer: Medicare Other | Admitting: Nurse Practitioner

## 2023-11-11 LAB — CULTURE, BLOOD (ROUTINE X 2)
Culture: NO GROWTH
Culture: NO GROWTH
Special Requests: ADEQUATE
Special Requests: ADEQUATE

## 2023-12-14 ENCOUNTER — Telehealth (INDEPENDENT_AMBULATORY_CARE_PROVIDER_SITE_OTHER): Payer: Self-pay

## 2023-12-14 NOTE — Telephone Encounter (Signed)
 Patient called for appt conformation but she doesn't have an upcoming appointment here. I call the number she left to call her back on which is [name]).
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
## 2023-12-30 ENCOUNTER — Ambulatory Visit: Payer: Medicare Other | Attending: Physician Assistant | Admitting: Physician Assistant

## 2024-01-05 ENCOUNTER — Encounter: Admitting: Physician Assistant

## 2024-01-05 ENCOUNTER — Encounter: Attending: Physician Assistant | Admitting: Physician Assistant

## 2024-01-05 DIAGNOSIS — L97812 Non-pressure chronic ulcer of other part of right lower leg with fat layer exposed: Secondary | ICD-10-CM | POA: Diagnosis not present

## 2024-01-05 DIAGNOSIS — L97822 Non-pressure chronic ulcer of other part of left lower leg with fat layer exposed: Secondary | ICD-10-CM | POA: Diagnosis not present

## 2024-01-05 DIAGNOSIS — I87323 Chronic venous hypertension (idiopathic) with inflammation of bilateral lower extremity: Secondary | ICD-10-CM | POA: Diagnosis not present

## 2024-01-05 DIAGNOSIS — I890 Lymphedema, not elsewhere classified: Secondary | ICD-10-CM | POA: Diagnosis present

## 2024-01-05 DIAGNOSIS — L98411 Non-pressure chronic ulcer of buttock limited to breakdown of skin: Secondary | ICD-10-CM | POA: Insufficient documentation

## 2024-01-05 DIAGNOSIS — L97112 Non-pressure chronic ulcer of right thigh with fat layer exposed: Secondary | ICD-10-CM | POA: Diagnosis not present

## 2024-01-05 DIAGNOSIS — S30810A Abrasion of lower back and pelvis, initial encounter: Secondary | ICD-10-CM | POA: Diagnosis not present

## 2024-01-05 DIAGNOSIS — I5042 Chronic combined systolic (congestive) and diastolic (congestive) heart failure: Secondary | ICD-10-CM | POA: Diagnosis not present

## 2024-01-05 DIAGNOSIS — L97521 Non-pressure chronic ulcer of other part of left foot limited to breakdown of skin: Secondary | ICD-10-CM | POA: Insufficient documentation

## 2024-01-05 DIAGNOSIS — X58XXXA Exposure to other specified factors, initial encounter: Secondary | ICD-10-CM | POA: Diagnosis not present

## 2024-01-12 ENCOUNTER — Ambulatory Visit: Admitting: Physician Assistant

## 2024-01-13 ENCOUNTER — Encounter: Attending: Physician Assistant | Admitting: Physician Assistant

## 2024-01-13 DIAGNOSIS — L97521 Non-pressure chronic ulcer of other part of left foot limited to breakdown of skin: Secondary | ICD-10-CM | POA: Insufficient documentation

## 2024-01-13 DIAGNOSIS — L97822 Non-pressure chronic ulcer of other part of left lower leg with fat layer exposed: Secondary | ICD-10-CM | POA: Diagnosis not present

## 2024-01-13 DIAGNOSIS — L97812 Non-pressure chronic ulcer of other part of right lower leg with fat layer exposed: Secondary | ICD-10-CM | POA: Diagnosis not present

## 2024-01-13 DIAGNOSIS — I5042 Chronic combined systolic (congestive) and diastolic (congestive) heart failure: Secondary | ICD-10-CM | POA: Insufficient documentation

## 2024-01-13 DIAGNOSIS — S30810A Abrasion of lower back and pelvis, initial encounter: Secondary | ICD-10-CM | POA: Diagnosis not present

## 2024-01-13 DIAGNOSIS — L97112 Non-pressure chronic ulcer of right thigh with fat layer exposed: Secondary | ICD-10-CM | POA: Diagnosis not present

## 2024-01-13 DIAGNOSIS — I87323 Chronic venous hypertension (idiopathic) with inflammation of bilateral lower extremity: Secondary | ICD-10-CM | POA: Diagnosis present

## 2024-01-13 DIAGNOSIS — I890 Lymphedema, not elsewhere classified: Secondary | ICD-10-CM | POA: Diagnosis not present

## 2024-01-13 DIAGNOSIS — L98411 Non-pressure chronic ulcer of buttock limited to breakdown of skin: Secondary | ICD-10-CM | POA: Insufficient documentation

## 2024-01-19 ENCOUNTER — Encounter: Admitting: Physician Assistant

## 2024-01-19 DIAGNOSIS — I87323 Chronic venous hypertension (idiopathic) with inflammation of bilateral lower extremity: Secondary | ICD-10-CM | POA: Diagnosis not present

## 2024-01-26 ENCOUNTER — Encounter: Admitting: Physician Assistant

## 2024-01-26 DIAGNOSIS — I87323 Chronic venous hypertension (idiopathic) with inflammation of bilateral lower extremity: Secondary | ICD-10-CM | POA: Diagnosis not present

## 2024-02-10 ENCOUNTER — Ambulatory Visit: Admitting: Physician Assistant

## 2024-02-25 ENCOUNTER — Encounter: Attending: Internal Medicine | Admitting: Internal Medicine

## 2024-02-25 DIAGNOSIS — S30810A Abrasion of lower back and pelvis, initial encounter: Secondary | ICD-10-CM | POA: Insufficient documentation

## 2024-02-25 DIAGNOSIS — I5042 Chronic combined systolic (congestive) and diastolic (congestive) heart failure: Secondary | ICD-10-CM | POA: Insufficient documentation

## 2024-02-25 DIAGNOSIS — L97521 Non-pressure chronic ulcer of other part of left foot limited to breakdown of skin: Secondary | ICD-10-CM | POA: Diagnosis not present

## 2024-02-25 DIAGNOSIS — L97112 Non-pressure chronic ulcer of right thigh with fat layer exposed: Secondary | ICD-10-CM | POA: Diagnosis not present

## 2024-02-25 DIAGNOSIS — X58XXXA Exposure to other specified factors, initial encounter: Secondary | ICD-10-CM | POA: Insufficient documentation

## 2024-02-25 DIAGNOSIS — L97812 Non-pressure chronic ulcer of other part of right lower leg with fat layer exposed: Secondary | ICD-10-CM | POA: Diagnosis not present

## 2024-02-25 DIAGNOSIS — I890 Lymphedema, not elsewhere classified: Secondary | ICD-10-CM | POA: Insufficient documentation

## 2024-02-25 DIAGNOSIS — L97822 Non-pressure chronic ulcer of other part of left lower leg with fat layer exposed: Secondary | ICD-10-CM | POA: Insufficient documentation

## 2024-02-25 DIAGNOSIS — I87323 Chronic venous hypertension (idiopathic) with inflammation of bilateral lower extremity: Secondary | ICD-10-CM | POA: Insufficient documentation

## 2024-02-25 DIAGNOSIS — L98411 Non-pressure chronic ulcer of buttock limited to breakdown of skin: Secondary | ICD-10-CM | POA: Insufficient documentation

## 2024-03-10 ENCOUNTER — Encounter: Attending: Physician Assistant | Admitting: Physician Assistant

## 2024-03-10 DIAGNOSIS — I890 Lymphedema, not elsewhere classified: Secondary | ICD-10-CM | POA: Insufficient documentation

## 2024-03-10 DIAGNOSIS — I87333 Chronic venous hypertension (idiopathic) with ulcer and inflammation of bilateral lower extremity: Secondary | ICD-10-CM | POA: Diagnosis present

## 2024-03-10 DIAGNOSIS — L97521 Non-pressure chronic ulcer of other part of left foot limited to breakdown of skin: Secondary | ICD-10-CM | POA: Diagnosis not present

## 2024-03-10 DIAGNOSIS — I5042 Chronic combined systolic (congestive) and diastolic (congestive) heart failure: Secondary | ICD-10-CM | POA: Diagnosis not present

## 2024-03-10 DIAGNOSIS — S30810A Abrasion of lower back and pelvis, initial encounter: Secondary | ICD-10-CM | POA: Diagnosis not present

## 2024-03-10 DIAGNOSIS — L97822 Non-pressure chronic ulcer of other part of left lower leg with fat layer exposed: Secondary | ICD-10-CM | POA: Insufficient documentation

## 2024-03-10 DIAGNOSIS — L97112 Non-pressure chronic ulcer of right thigh with fat layer exposed: Secondary | ICD-10-CM | POA: Insufficient documentation

## 2024-03-10 DIAGNOSIS — L97812 Non-pressure chronic ulcer of other part of right lower leg with fat layer exposed: Secondary | ICD-10-CM | POA: Insufficient documentation

## 2024-03-10 DIAGNOSIS — L98411 Non-pressure chronic ulcer of buttock limited to breakdown of skin: Secondary | ICD-10-CM | POA: Insufficient documentation

## 2024-03-10 DIAGNOSIS — X58XXXA Exposure to other specified factors, initial encounter: Secondary | ICD-10-CM | POA: Diagnosis not present

## 2024-03-16 ENCOUNTER — Encounter: Admitting: Physician Assistant

## 2024-03-16 DIAGNOSIS — I87333 Chronic venous hypertension (idiopathic) with ulcer and inflammation of bilateral lower extremity: Secondary | ICD-10-CM | POA: Diagnosis not present

## 2024-03-22 ENCOUNTER — Encounter: Admitting: Physician Assistant

## 2024-03-22 DIAGNOSIS — I87333 Chronic venous hypertension (idiopathic) with ulcer and inflammation of bilateral lower extremity: Secondary | ICD-10-CM | POA: Diagnosis not present

## 2024-03-31 ENCOUNTER — Encounter: Admitting: Physician Assistant

## 2024-03-31 DIAGNOSIS — I87333 Chronic venous hypertension (idiopathic) with ulcer and inflammation of bilateral lower extremity: Secondary | ICD-10-CM | POA: Diagnosis not present

## 2024-04-06 ENCOUNTER — Encounter: Admitting: Physician Assistant

## 2024-04-06 DIAGNOSIS — I87333 Chronic venous hypertension (idiopathic) with ulcer and inflammation of bilateral lower extremity: Secondary | ICD-10-CM | POA: Diagnosis not present

## 2024-04-07 ENCOUNTER — Ambulatory Visit: Admitting: Physician Assistant

## 2024-04-19 ENCOUNTER — Ambulatory Visit: Admitting: Physician Assistant

## 2024-04-20 ENCOUNTER — Encounter: Attending: Physician Assistant | Admitting: Physician Assistant

## 2024-04-20 DIAGNOSIS — L97822 Non-pressure chronic ulcer of other part of left lower leg with fat layer exposed: Secondary | ICD-10-CM | POA: Insufficient documentation

## 2024-04-20 DIAGNOSIS — L97812 Non-pressure chronic ulcer of other part of right lower leg with fat layer exposed: Secondary | ICD-10-CM | POA: Diagnosis not present

## 2024-04-20 DIAGNOSIS — X58XXXA Exposure to other specified factors, initial encounter: Secondary | ICD-10-CM | POA: Diagnosis not present

## 2024-04-20 DIAGNOSIS — S30810A Abrasion of lower back and pelvis, initial encounter: Secondary | ICD-10-CM | POA: Insufficient documentation

## 2024-04-20 DIAGNOSIS — I890 Lymphedema, not elsewhere classified: Secondary | ICD-10-CM | POA: Insufficient documentation

## 2024-04-20 DIAGNOSIS — I87323 Chronic venous hypertension (idiopathic) with inflammation of bilateral lower extremity: Secondary | ICD-10-CM | POA: Diagnosis not present

## 2024-04-20 DIAGNOSIS — L98411 Non-pressure chronic ulcer of buttock limited to breakdown of skin: Secondary | ICD-10-CM | POA: Diagnosis not present

## 2024-04-20 DIAGNOSIS — L97112 Non-pressure chronic ulcer of right thigh with fat layer exposed: Secondary | ICD-10-CM | POA: Diagnosis not present

## 2024-04-20 DIAGNOSIS — L97521 Non-pressure chronic ulcer of other part of left foot limited to breakdown of skin: Secondary | ICD-10-CM | POA: Diagnosis not present

## 2024-04-29 ENCOUNTER — Ambulatory Visit: Payer: Medicare Other | Admitting: Oncology

## 2024-04-29 ENCOUNTER — Inpatient Hospital Stay: Payer: Medicare Other | Admitting: Oncology

## 2024-04-29 ENCOUNTER — Other Ambulatory Visit: Payer: Medicare Other

## 2024-04-29 ENCOUNTER — Inpatient Hospital Stay: Payer: Medicare Other | Attending: Oncology

## 2024-05-03 ENCOUNTER — Encounter: Admitting: Internal Medicine

## 2024-05-03 DIAGNOSIS — I890 Lymphedema, not elsewhere classified: Secondary | ICD-10-CM | POA: Diagnosis not present

## 2024-05-17 ENCOUNTER — Encounter: Attending: Physician Assistant | Admitting: Physician Assistant

## 2024-05-17 DIAGNOSIS — S30810A Abrasion of lower back and pelvis, initial encounter: Secondary | ICD-10-CM | POA: Insufficient documentation

## 2024-05-17 DIAGNOSIS — I5042 Chronic combined systolic (congestive) and diastolic (congestive) heart failure: Secondary | ICD-10-CM | POA: Insufficient documentation

## 2024-05-17 DIAGNOSIS — L98411 Non-pressure chronic ulcer of buttock limited to breakdown of skin: Secondary | ICD-10-CM | POA: Diagnosis not present

## 2024-05-17 DIAGNOSIS — I890 Lymphedema, not elsewhere classified: Secondary | ICD-10-CM | POA: Insufficient documentation

## 2024-05-17 DIAGNOSIS — L97112 Non-pressure chronic ulcer of right thigh with fat layer exposed: Secondary | ICD-10-CM | POA: Insufficient documentation

## 2024-05-17 DIAGNOSIS — I87333 Chronic venous hypertension (idiopathic) with ulcer and inflammation of bilateral lower extremity: Secondary | ICD-10-CM | POA: Insufficient documentation

## 2024-05-17 DIAGNOSIS — L97812 Non-pressure chronic ulcer of other part of right lower leg with fat layer exposed: Secondary | ICD-10-CM | POA: Diagnosis not present

## 2024-05-17 DIAGNOSIS — L97521 Non-pressure chronic ulcer of other part of left foot limited to breakdown of skin: Secondary | ICD-10-CM | POA: Diagnosis not present

## 2024-05-17 DIAGNOSIS — X58XXXA Exposure to other specified factors, initial encounter: Secondary | ICD-10-CM | POA: Diagnosis not present

## 2024-05-17 DIAGNOSIS — L97822 Non-pressure chronic ulcer of other part of left lower leg with fat layer exposed: Secondary | ICD-10-CM | POA: Insufficient documentation

## 2024-05-31 ENCOUNTER — Encounter: Admitting: Physician Assistant

## 2024-05-31 DIAGNOSIS — I87333 Chronic venous hypertension (idiopathic) with ulcer and inflammation of bilateral lower extremity: Secondary | ICD-10-CM | POA: Diagnosis not present

## 2024-06-14 ENCOUNTER — Encounter: Attending: Physician Assistant | Admitting: Physician Assistant

## 2024-06-14 DIAGNOSIS — L98411 Non-pressure chronic ulcer of buttock limited to breakdown of skin: Secondary | ICD-10-CM | POA: Diagnosis not present

## 2024-06-14 DIAGNOSIS — I890 Lymphedema, not elsewhere classified: Secondary | ICD-10-CM | POA: Diagnosis not present

## 2024-06-14 DIAGNOSIS — L97822 Non-pressure chronic ulcer of other part of left lower leg with fat layer exposed: Secondary | ICD-10-CM | POA: Insufficient documentation

## 2024-06-14 DIAGNOSIS — S30810A Abrasion of lower back and pelvis, initial encounter: Secondary | ICD-10-CM | POA: Insufficient documentation

## 2024-06-14 DIAGNOSIS — X58XXXA Exposure to other specified factors, initial encounter: Secondary | ICD-10-CM | POA: Diagnosis not present

## 2024-06-14 DIAGNOSIS — L97112 Non-pressure chronic ulcer of right thigh with fat layer exposed: Secondary | ICD-10-CM | POA: Diagnosis not present

## 2024-06-14 DIAGNOSIS — I87323 Chronic venous hypertension (idiopathic) with inflammation of bilateral lower extremity: Secondary | ICD-10-CM | POA: Diagnosis present

## 2024-06-14 DIAGNOSIS — L97812 Non-pressure chronic ulcer of other part of right lower leg with fat layer exposed: Secondary | ICD-10-CM | POA: Insufficient documentation

## 2024-06-14 DIAGNOSIS — I5042 Chronic combined systolic (congestive) and diastolic (congestive) heart failure: Secondary | ICD-10-CM | POA: Diagnosis not present

## 2024-06-14 DIAGNOSIS — L97521 Non-pressure chronic ulcer of other part of left foot limited to breakdown of skin: Secondary | ICD-10-CM | POA: Diagnosis not present

## 2024-06-28 ENCOUNTER — Ambulatory Visit: Admitting: Physician Assistant

## 2024-07-03 ENCOUNTER — Emergency Department

## 2024-07-03 ENCOUNTER — Other Ambulatory Visit: Payer: Self-pay

## 2024-07-03 ENCOUNTER — Inpatient Hospital Stay
Admission: EM | Admit: 2024-07-03 | Discharge: 2024-08-08 | DRG: 871 | Disposition: E | Source: Skilled Nursing Facility | Attending: Osteopathic Medicine | Admitting: Osteopathic Medicine

## 2024-07-03 ENCOUNTER — Inpatient Hospital Stay

## 2024-07-03 DIAGNOSIS — D6959 Other secondary thrombocytopenia: Secondary | ICD-10-CM | POA: Diagnosis present

## 2024-07-03 DIAGNOSIS — G9341 Metabolic encephalopathy: Secondary | ICD-10-CM | POA: Diagnosis present

## 2024-07-03 DIAGNOSIS — Y848 Other medical procedures as the cause of abnormal reaction of the patient, or of later complication, without mention of misadventure at the time of the procedure: Secondary | ICD-10-CM | POA: Diagnosis not present

## 2024-07-03 DIAGNOSIS — Z8673 Personal history of transient ischemic attack (TIA), and cerebral infarction without residual deficits: Secondary | ICD-10-CM

## 2024-07-03 DIAGNOSIS — Z79899 Other long term (current) drug therapy: Secondary | ICD-10-CM

## 2024-07-03 DIAGNOSIS — R6520 Severe sepsis without septic shock: Secondary | ICD-10-CM | POA: Diagnosis not present

## 2024-07-03 DIAGNOSIS — Z87891 Personal history of nicotine dependence: Secondary | ICD-10-CM

## 2024-07-03 DIAGNOSIS — N3080 Other cystitis without hematuria: Secondary | ICD-10-CM | POA: Diagnosis present

## 2024-07-03 DIAGNOSIS — H9190 Unspecified hearing loss, unspecified ear: Secondary | ICD-10-CM | POA: Diagnosis present

## 2024-07-03 DIAGNOSIS — Z66 Do not resuscitate: Secondary | ICD-10-CM | POA: Diagnosis present

## 2024-07-03 DIAGNOSIS — R578 Other shock: Secondary | ICD-10-CM | POA: Diagnosis not present

## 2024-07-03 DIAGNOSIS — G4733 Obstructive sleep apnea (adult) (pediatric): Secondary | ICD-10-CM | POA: Diagnosis present

## 2024-07-03 DIAGNOSIS — N1832 Chronic kidney disease, stage 3b: Secondary | ICD-10-CM | POA: Diagnosis present

## 2024-07-03 DIAGNOSIS — B377 Candidal sepsis: Secondary | ICD-10-CM

## 2024-07-03 DIAGNOSIS — R197 Diarrhea, unspecified: Secondary | ICD-10-CM | POA: Diagnosis not present

## 2024-07-03 DIAGNOSIS — Z993 Dependence on wheelchair: Secondary | ICD-10-CM

## 2024-07-03 DIAGNOSIS — A4159 Other Gram-negative sepsis: Secondary | ICD-10-CM | POA: Diagnosis present

## 2024-07-03 DIAGNOSIS — T80218A Other infection due to central venous catheter, initial encounter: Secondary | ICD-10-CM | POA: Diagnosis not present

## 2024-07-03 DIAGNOSIS — Z923 Personal history of irradiation: Secondary | ICD-10-CM

## 2024-07-03 DIAGNOSIS — L89313 Pressure ulcer of right buttock, stage 3: Secondary | ICD-10-CM | POA: Diagnosis present

## 2024-07-03 DIAGNOSIS — Z6841 Body Mass Index (BMI) 40.0 and over, adult: Secondary | ICD-10-CM

## 2024-07-03 DIAGNOSIS — B9620 Unspecified Escherichia coli [E. coli] as the cause of diseases classified elsewhere: Secondary | ICD-10-CM | POA: Diagnosis present

## 2024-07-03 DIAGNOSIS — E785 Hyperlipidemia, unspecified: Secondary | ICD-10-CM | POA: Diagnosis present

## 2024-07-03 DIAGNOSIS — K219 Gastro-esophageal reflux disease without esophagitis: Secondary | ICD-10-CM | POA: Diagnosis present

## 2024-07-03 DIAGNOSIS — G929 Unspecified toxic encephalopathy: Secondary | ICD-10-CM

## 2024-07-03 DIAGNOSIS — L89323 Pressure ulcer of left buttock, stage 3: Secondary | ICD-10-CM | POA: Diagnosis present

## 2024-07-03 DIAGNOSIS — L03116 Cellulitis of left lower limb: Secondary | ICD-10-CM | POA: Diagnosis present

## 2024-07-03 DIAGNOSIS — N170 Acute kidney failure with tubular necrosis: Secondary | ICD-10-CM | POA: Diagnosis present

## 2024-07-03 DIAGNOSIS — Z9221 Personal history of antineoplastic chemotherapy: Secondary | ICD-10-CM

## 2024-07-03 DIAGNOSIS — E1165 Type 2 diabetes mellitus with hyperglycemia: Secondary | ICD-10-CM | POA: Diagnosis present

## 2024-07-03 DIAGNOSIS — I130 Hypertensive heart and chronic kidney disease with heart failure and stage 1 through stage 4 chronic kidney disease, or unspecified chronic kidney disease: Secondary | ICD-10-CM | POA: Diagnosis present

## 2024-07-03 DIAGNOSIS — I469 Cardiac arrest, cause unspecified: Secondary | ICD-10-CM | POA: Diagnosis not present

## 2024-07-03 DIAGNOSIS — R739 Hyperglycemia, unspecified: Secondary | ICD-10-CM

## 2024-07-03 DIAGNOSIS — B961 Klebsiella pneumoniae [K. pneumoniae] as the cause of diseases classified elsewhere: Secondary | ICD-10-CM | POA: Diagnosis present

## 2024-07-03 DIAGNOSIS — Z7901 Long term (current) use of anticoagulants: Secondary | ICD-10-CM

## 2024-07-03 DIAGNOSIS — R092 Respiratory arrest: Secondary | ICD-10-CM | POA: Diagnosis not present

## 2024-07-03 DIAGNOSIS — S81801A Unspecified open wound, right lower leg, initial encounter: Secondary | ICD-10-CM | POA: Diagnosis not present

## 2024-07-03 DIAGNOSIS — Z853 Personal history of malignant neoplasm of breast: Secondary | ICD-10-CM

## 2024-07-03 DIAGNOSIS — S81802A Unspecified open wound, left lower leg, initial encounter: Secondary | ICD-10-CM | POA: Diagnosis not present

## 2024-07-03 DIAGNOSIS — E8729 Other acidosis: Secondary | ICD-10-CM | POA: Diagnosis not present

## 2024-07-03 DIAGNOSIS — R6521 Severe sepsis with septic shock: Secondary | ICD-10-CM | POA: Diagnosis present

## 2024-07-03 DIAGNOSIS — Z515 Encounter for palliative care: Secondary | ICD-10-CM | POA: Diagnosis not present

## 2024-07-03 DIAGNOSIS — R7881 Bacteremia: Secondary | ICD-10-CM | POA: Diagnosis not present

## 2024-07-03 DIAGNOSIS — R54 Age-related physical debility: Secondary | ICD-10-CM | POA: Diagnosis present

## 2024-07-03 DIAGNOSIS — J9811 Atelectasis: Secondary | ICD-10-CM | POA: Diagnosis present

## 2024-07-03 DIAGNOSIS — N390 Urinary tract infection, site not specified: Secondary | ICD-10-CM

## 2024-07-03 DIAGNOSIS — Z1612 Extended spectrum beta lactamase (ESBL) resistance: Secondary | ICD-10-CM | POA: Diagnosis present

## 2024-07-03 DIAGNOSIS — E66813 Obesity, class 3: Secondary | ICD-10-CM | POA: Diagnosis present

## 2024-07-03 DIAGNOSIS — J9601 Acute respiratory failure with hypoxia: Secondary | ICD-10-CM | POA: Diagnosis not present

## 2024-07-03 DIAGNOSIS — A4151 Sepsis due to Escherichia coli [E. coli]: Secondary | ICD-10-CM | POA: Diagnosis not present

## 2024-07-03 DIAGNOSIS — G8929 Other chronic pain: Secondary | ICD-10-CM | POA: Diagnosis present

## 2024-07-03 DIAGNOSIS — L89101 Pressure ulcer of unspecified part of back, stage 1: Secondary | ICD-10-CM | POA: Diagnosis present

## 2024-07-03 DIAGNOSIS — D696 Thrombocytopenia, unspecified: Secondary | ICD-10-CM

## 2024-07-03 DIAGNOSIS — Z7984 Long term (current) use of oral hypoglycemic drugs: Secondary | ICD-10-CM

## 2024-07-03 DIAGNOSIS — Z85841 Personal history of malignant neoplasm of brain: Secondary | ICD-10-CM

## 2024-07-03 DIAGNOSIS — D631 Anemia in chronic kidney disease: Secondary | ICD-10-CM | POA: Diagnosis present

## 2024-07-03 DIAGNOSIS — Z794 Long term (current) use of insulin: Secondary | ICD-10-CM

## 2024-07-03 DIAGNOSIS — Z803 Family history of malignant neoplasm of breast: Secondary | ICD-10-CM

## 2024-07-03 DIAGNOSIS — E872 Acidosis, unspecified: Secondary | ICD-10-CM | POA: Diagnosis present

## 2024-07-03 DIAGNOSIS — Z1152 Encounter for screening for COVID-19: Secondary | ICD-10-CM

## 2024-07-03 DIAGNOSIS — Z86718 Personal history of other venous thrombosis and embolism: Secondary | ICD-10-CM

## 2024-07-03 DIAGNOSIS — E870 Hyperosmolality and hypernatremia: Secondary | ICD-10-CM | POA: Diagnosis not present

## 2024-07-03 DIAGNOSIS — Z7952 Long term (current) use of systemic steroids: Secondary | ICD-10-CM

## 2024-07-03 DIAGNOSIS — R627 Adult failure to thrive: Secondary | ICD-10-CM | POA: Diagnosis not present

## 2024-07-03 DIAGNOSIS — B379 Candidiasis, unspecified: Secondary | ICD-10-CM | POA: Diagnosis not present

## 2024-07-03 DIAGNOSIS — D709 Neutropenia, unspecified: Secondary | ICD-10-CM | POA: Diagnosis present

## 2024-07-03 DIAGNOSIS — I5032 Chronic diastolic (congestive) heart failure: Secondary | ICD-10-CM | POA: Diagnosis present

## 2024-07-03 DIAGNOSIS — Z79811 Long term (current) use of aromatase inhibitors: Secondary | ICD-10-CM

## 2024-07-03 DIAGNOSIS — A419 Sepsis, unspecified organism: Principal | ICD-10-CM | POA: Diagnosis present

## 2024-07-03 DIAGNOSIS — Z789 Other specified health status: Secondary | ICD-10-CM | POA: Diagnosis not present

## 2024-07-03 DIAGNOSIS — E876 Hypokalemia: Secondary | ICD-10-CM | POA: Diagnosis present

## 2024-07-03 DIAGNOSIS — I480 Paroxysmal atrial fibrillation: Secondary | ICD-10-CM | POA: Diagnosis present

## 2024-07-03 DIAGNOSIS — I4891 Unspecified atrial fibrillation: Secondary | ICD-10-CM

## 2024-07-03 DIAGNOSIS — Z8249 Family history of ischemic heart disease and other diseases of the circulatory system: Secondary | ICD-10-CM

## 2024-07-03 DIAGNOSIS — Z7189 Other specified counseling: Secondary | ICD-10-CM | POA: Diagnosis not present

## 2024-07-03 DIAGNOSIS — E1122 Type 2 diabetes mellitus with diabetic chronic kidney disease: Secondary | ICD-10-CM | POA: Diagnosis present

## 2024-07-03 DIAGNOSIS — Z95828 Presence of other vascular implants and grafts: Secondary | ICD-10-CM

## 2024-07-03 DIAGNOSIS — E1151 Type 2 diabetes mellitus with diabetic peripheral angiopathy without gangrene: Secondary | ICD-10-CM | POA: Diagnosis present

## 2024-07-03 DIAGNOSIS — L89616 Pressure-induced deep tissue damage of right heel: Secondary | ICD-10-CM | POA: Diagnosis present

## 2024-07-03 DIAGNOSIS — I872 Venous insufficiency (chronic) (peripheral): Secondary | ICD-10-CM | POA: Diagnosis not present

## 2024-07-03 DIAGNOSIS — T07XXXA Unspecified multiple injuries, initial encounter: Secondary | ICD-10-CM

## 2024-07-03 LAB — URINALYSIS, W/ REFLEX TO CULTURE (INFECTION SUSPECTED)
Bilirubin Urine: NEGATIVE
Glucose, UA: >=500 mg/dL — AB
Hgb urine dipstick: NEGATIVE
Ketones, ur: NEGATIVE mg/dL
Nitrite: NEGATIVE
Protein, ur: NEGATIVE mg/dL
Specific Gravity, Urine: 1.011 (ref 1.005–1.030)
Squamous Epithelial / HPF: 0 /HPF (ref 0–5)
WBC, UA: >50 WBC/hpf (ref 0–5)
pH: 5 (ref 5.0–8.0)

## 2024-07-03 LAB — BLOOD GAS, ARTERIAL
Acid-base deficit: 9.9 mmol/L — ABNORMAL HIGH (ref 0.0–2.0)
Bicarbonate: 15.3 mmol/L — ABNORMAL LOW (ref 20.0–28.0)
O2 Saturation: 91.7 %
Patient temperature: 37
pCO2 arterial: 31 mmHg — ABNORMAL LOW (ref 32–48)
pH, Arterial: 7.3 — ABNORMAL LOW (ref 7.35–7.45)
pO2, Arterial: 66 mmHg — ABNORMAL LOW (ref 83–108)

## 2024-07-03 LAB — BLOOD CULTURE ID PANEL (REFLEXED) - BCID2

## 2024-07-03 LAB — COMPREHENSIVE METABOLIC PANEL WITH GFR
ALT: 12 U/L (ref 0–44)
AST: 24 U/L (ref 15–41)
Albumin: 1.8 g/dL — ABNORMAL LOW (ref 3.5–5.0)
Alkaline Phosphatase: 49 U/L (ref 38–126)
Anion gap: 20 — ABNORMAL HIGH (ref 5–15)
BUN: 45 mg/dL — ABNORMAL HIGH (ref 8–23)
CO2: 25 mmol/L (ref 22–32)
Calcium: 8.5 mg/dL — ABNORMAL LOW (ref 8.9–10.3)
Chloride: 95 mmol/L — ABNORMAL LOW (ref 98–111)
Creatinine, Ser: 1.66 mg/dL — ABNORMAL HIGH (ref 0.44–1.00)
GFR, Estimated: 31 mL/min — ABNORMAL LOW (ref >60)
Glucose, Bld: 154 mg/dL — ABNORMAL HIGH (ref 70–99)
Potassium: 4.1 mmol/L (ref 3.5–5.1)
Sodium: 140 mmol/L (ref 135–145)
Total Bilirubin: 0.9 mg/dL (ref 0.0–1.2)
Total Protein: 5.8 g/dL — ABNORMAL LOW (ref 6.5–8.1)

## 2024-07-03 LAB — GLUCOSE, CAPILLARY
Glucose-Capillary: 64 mg/dL — ABNORMAL LOW (ref 70–99)
Glucose-Capillary: 79 mg/dL (ref 70–99)
Glucose-Capillary: 88 mg/dL (ref 70–99)

## 2024-07-03 LAB — CBC WITH DIFFERENTIAL/PLATELET
Abs Immature Granulocytes: 0 K/uL (ref 0.00–0.07)
Abs Immature Granulocytes: 0.64 K/uL — ABNORMAL HIGH (ref 0.00–0.07)
Basophils Absolute: 0 K/uL (ref 0.0–0.1)
Basophils Absolute: 0 K/uL (ref 0.0–0.1)
Basophils Relative: 4 %
Basophils Relative: 3 %
Eosinophils Absolute: 0 K/uL (ref 0.0–0.5)
Eosinophils Absolute: 0 K/uL (ref 0.0–0.5)
Eosinophils Relative: 0 %
Eosinophils Relative: 0 %
HCT: 38.6 % (ref 36.0–46.0)
HCT: 39.4 % (ref 36.0–46.0)
Hemoglobin: 12.2 g/dL (ref 12.0–15.0)
Hemoglobin: 12.1 g/dL (ref 12.0–15.0)
Immature Granulocytes: 0 %
Immature Granulocytes: 58 %
Lymphocytes Relative: 28 %
Lymphocytes Relative: 6 %
Lymphs Abs: 0.1 K/uL — ABNORMAL LOW (ref 0.7–4.0)
Lymphs Abs: 0.1 K/uL — ABNORMAL LOW (ref 0.7–4.0)
MCH: 28.5 pg (ref 26.0–34.0)
MCH: 28.4 pg (ref 26.0–34.0)
MCHC: 31.6 g/dL (ref 30.0–36.0)
MCHC: 30.7 g/dL (ref 30.0–36.0)
MCV: 90.2 fL (ref 80.0–100.0)
MCV: 92.5 fL (ref 80.0–100.0)
Monocytes Absolute: 0 K/uL — ABNORMAL LOW (ref 0.1–1.0)
Monocytes Absolute: 0 K/uL — ABNORMAL LOW (ref 0.1–1.0)
Monocytes Relative: 4 %
Monocytes Relative: 4 %
Neutro Abs: 0.3 K/uL — CL (ref 1.7–7.7)
Neutro Abs: 0.3 K/uL — CL (ref 1.7–7.7)
Neutrophils Relative %: 64 %
Neutrophils Relative %: 29 %
Platelets: 147 K/uL — ABNORMAL LOW (ref 150–400)
Platelets: 134 K/uL — ABNORMAL LOW (ref 150–400)
RBC: 4.28 MIL/uL (ref 3.87–5.11)
RBC: 4.26 MIL/uL (ref 3.87–5.11)
RDW: 14.8 % (ref 11.5–15.5)
RDW: 14.9 % (ref 11.5–15.5)
Smear Review: NORMAL
WBC: 0.5 K/uL — CL (ref 4.0–10.5)
WBC: 1.1 K/uL — CL (ref 4.0–10.5)
nRBC: 35.3 % — ABNORMAL HIGH (ref 0.0–0.2)
nRBC: 33.9 % — ABNORMAL HIGH (ref 0.0–0.2)

## 2024-07-03 LAB — PROTIME-INR
INR: 2.3 — ABNORMAL HIGH (ref 0.8–1.2)
Prothrombin Time: 26.5 s — ABNORMAL HIGH (ref 11.4–15.2)

## 2024-07-03 LAB — HEMOGLOBIN A1C
Hgb A1c MFr Bld: 7.5 % — ABNORMAL HIGH (ref 4.8–5.6)
Mean Plasma Glucose: 168.55 mg/dL

## 2024-07-03 LAB — RESP PANEL BY RT-PCR (RSV, FLU A&B, COVID)  RVPGX2
Influenza A by PCR: NEGATIVE
Influenza B by PCR: NEGATIVE
Resp Syncytial Virus by PCR: NEGATIVE
SARS Coronavirus 2 by RT PCR: NEGATIVE

## 2024-07-03 LAB — BRAIN NATRIURETIC PEPTIDE: B Natriuretic Peptide: 135.6 pg/mL — ABNORMAL HIGH (ref 0.0–100.0)

## 2024-07-03 LAB — TSH: TSH: 1.386 u[IU]/mL (ref 0.350–4.500)

## 2024-07-03 LAB — LACTIC ACID, PLASMA
Lactic Acid, Venous: 6.4 mmol/L (ref 0.5–1.9)
Lactic Acid, Venous: 8.9 mmol/L (ref 0.5–1.9)
Lactic Acid, Venous: >9 mmol/L (ref 0.5–1.9)
Lactic Acid, Venous: 5.5 mmol/L (ref 0.5–1.9)

## 2024-07-03 LAB — MRSA NEXT GEN BY PCR, NASAL: MRSA by PCR Next Gen: DETECTED — AB

## 2024-07-03 LAB — MAGNESIUM: Magnesium: 1.7 mg/dL (ref 1.7–2.4)

## 2024-07-03 LAB — TROPONIN I (HIGH SENSITIVITY)
Troponin I (High Sensitivity): 35 ng/L — ABNORMAL HIGH (ref <18)
Troponin I (High Sensitivity): 58 ng/L — ABNORMAL HIGH (ref <18)

## 2024-07-03 LAB — PHOSPHORUS: Phosphorus: 3.5 mg/dL (ref 2.5–4.6)

## 2024-07-03 MED ORDER — SODIUM CHLORIDE 0.9 % IV BOLUS (SEPSIS)
1000.0000 mL | Freq: Once | INTRAVENOUS | Status: AC
  Administered 2024-07-03: 1000 mL via INTRAVENOUS

## 2024-07-03 MED ORDER — SODIUM CHLORIDE 0.9 % IV SOLN
250.0000 mL | INTRAVENOUS | Status: AC
  Administered 2024-07-03 (×2): 250 mL via INTRAVENOUS

## 2024-07-03 MED ORDER — NOREPINEPHRINE 4 MG/250ML-% IV SOLN
0.0000 ug/min | INTRAVENOUS | Status: DC
  Administered 2024-07-03: 7 ug/min via INTRAVENOUS
  Administered 2024-07-03: 4 ug/min via INTRAVENOUS
  Administered 2024-07-04: 14 ug/min via INTRAVENOUS
  Administered 2024-07-04: 22 ug/min via INTRAVENOUS
  Filled 2024-07-03 (×3): qty 250

## 2024-07-03 MED ORDER — FENTANYL CITRATE (PF) 50 MCG/ML IJ SOSY
PREFILLED_SYRINGE | INTRAMUSCULAR | Status: AC
  Administered 2024-07-03: 25 ug via INTRAVENOUS
  Filled 2024-07-03: qty 1

## 2024-07-03 MED ORDER — LACTATED RINGERS IV BOLUS
500.0000 mL | Freq: Once | INTRAVENOUS | Status: AC
  Administered 2024-07-03: 500 mL via INTRAVENOUS

## 2024-07-03 MED ORDER — CHLORHEXIDINE GLUCONATE CLOTH 2 % EX PADS
6.0000 | MEDICATED_PAD | Freq: Every day | CUTANEOUS | Status: DC
  Administered 2024-07-03: 6 via TOPICAL

## 2024-07-03 MED ORDER — METRONIDAZOLE 500 MG/100ML IV SOLN
500.0000 mg | Freq: Once | INTRAVENOUS | Status: AC
  Administered 2024-07-03: 500 mg via INTRAVENOUS
  Filled 2024-07-03: qty 100

## 2024-07-03 MED ORDER — SODIUM CHLORIDE 0.9 % IV SOLN: 2.0000 g | Freq: Two times a day (BID) | INTRAVENOUS | Status: DC

## 2024-07-03 MED ORDER — LACTATED RINGERS IV SOLN: INTRAVENOUS | Status: AC

## 2024-07-03 MED ORDER — MAGNESIUM SULFATE 2 GM/50ML IV SOLN
2.0000 g | Freq: Once | INTRAVENOUS | Status: AC
  Administered 2024-07-03: 2 g via INTRAVENOUS
  Filled 2024-07-03: qty 50

## 2024-07-03 MED ORDER — FENTANYL CITRATE (PF) 50 MCG/ML IJ SOSY: 50.0000 ug | PREFILLED_SYRINGE | Freq: Once | INTRAMUSCULAR | Status: AC

## 2024-07-03 MED ORDER — INSULIN ASPART 100 UNIT/ML IJ SOLN
0.0000 [IU] | INTRAMUSCULAR | Status: DC
  Administered 2024-07-04: 2 [IU] via SUBCUTANEOUS
  Administered 2024-07-04 – 2024-07-06 (×2): 1 [IU] via SUBCUTANEOUS
  Administered 2024-07-06: 2 [IU] via SUBCUTANEOUS
  Administered 2024-07-08: 1 [IU] via SUBCUTANEOUS
  Filled 2024-07-03 (×6): qty 1

## 2024-07-03 MED ORDER — VANCOMYCIN HCL IN DEXTROSE 1-5 GM/200ML-% IV SOLN
1000.0000 mg | Freq: Once | INTRAVENOUS | Status: AC
  Administered 2024-07-03: 1000 mg via INTRAVENOUS
  Filled 2024-07-03: qty 200

## 2024-07-03 MED ORDER — LACTATED RINGERS IV SOLN: INTRAVENOUS | Status: DC

## 2024-07-03 MED ORDER — VANCOMYCIN VARIABLE DOSE PER UNSTABLE RENAL FUNCTION (PHARMACIST DOSING)
Status: DC
  Filled 2024-07-03: qty 1

## 2024-07-03 MED ORDER — CHLORHEXIDINE GLUCONATE CLOTH 2 % EX PADS
6.0000 | MEDICATED_PAD | Freq: Every day | CUTANEOUS | Status: DC
  Administered 2024-07-03 – 2024-07-20 (×18): 6 via TOPICAL
  Filled 2024-07-03: qty 6

## 2024-07-03 MED ORDER — STERILE WATER FOR INJECTION IV SOLN
INTRAVENOUS | Status: DC
  Filled 2024-07-03: qty 150
  Filled 2024-07-03: qty 1000

## 2024-07-03 MED ORDER — POLYETHYLENE GLYCOL 3350 17 G PO PACK
17.0000 g | PACK | Freq: Every day | ORAL | Status: DC | PRN
Start: 2024-07-03 — End: 2024-07-21

## 2024-07-03 MED ORDER — SODIUM CHLORIDE 0.9 % IV BOLUS (SEPSIS)
800.0000 mL | Freq: Once | INTRAVENOUS | Status: AC
  Administered 2024-07-03: 800 mL via INTRAVENOUS

## 2024-07-03 MED ORDER — MUPIROCIN 2 % EX OINT
1.0000 | TOPICAL_OINTMENT | Freq: Two times a day (BID) | CUTANEOUS | Status: AC
  Administered 2024-07-03 – 2024-07-07 (×9): 1 via NASAL
  Filled 2024-07-03 (×2): qty 22

## 2024-07-03 MED ORDER — FENTANYL CITRATE (PF) 100 MCG/2ML IJ SOLN
INTRAMUSCULAR | Status: AC
  Administered 2024-07-03: 50 ug
  Filled 2024-07-03: qty 2

## 2024-07-03 MED ORDER — DOCUSATE SODIUM 100 MG PO CAPS: 100.0000 mg | ORAL_CAPSULE | Freq: Two times a day (BID) | ORAL | Status: DC | PRN

## 2024-07-03 MED ORDER — SODIUM CHLORIDE 0.9 % IV SOLN
2.0000 g | Freq: Once | INTRAVENOUS | Status: AC
  Administered 2024-07-03: 2 g via INTRAVENOUS
  Filled 2024-07-03: qty 12.5

## 2024-07-03 MED ORDER — VANCOMYCIN HCL 1250 MG/250ML IV SOLN
1250.0000 mg | Freq: Once | INTRAVENOUS | Status: AC
  Administered 2024-07-03: 1250 mg via INTRAVENOUS
  Filled 2024-07-03: qty 250

## 2024-07-03 MED ORDER — SODIUM CHLORIDE 0.9 % IV SOLN
2.0000 g | Freq: Two times a day (BID) | INTRAVENOUS | Status: DC
  Filled 2024-07-03: qty 12.5

## 2024-07-03 MED ORDER — IPRATROPIUM-ALBUTEROL 0.5-2.5 (3) MG/3ML IN SOLN: 3.0000 mL | Freq: Four times a day (QID) | RESPIRATORY_TRACT | Status: DC | PRN

## 2024-07-03 MED ORDER — HEPARIN SODIUM (PORCINE) 5000 UNIT/ML IJ SOLN
5000.0000 [IU] | Freq: Three times a day (TID) | INTRAMUSCULAR | Status: DC
  Administered 2024-07-03 – 2024-07-05 (×5): 5000 [IU] via SUBCUTANEOUS
  Filled 2024-07-03 (×5): qty 1

## 2024-07-03 MED ORDER — SODIUM CHLORIDE 0.9 % IV SOLN
1.0000 g | Freq: Two times a day (BID) | INTRAVENOUS | Status: DC
  Administered 2024-07-03 – 2024-07-05 (×5): 1 g via INTRAVENOUS
  Filled 2024-07-03 (×6): qty 20

## 2024-07-03 MED ORDER — GERHARDT'S BUTT CREAM
TOPICAL_CREAM | Freq: Two times a day (BID) | CUTANEOUS | Status: DC
  Administered 2024-07-05 – 2024-07-14 (×4): 1 via TOPICAL
  Filled 2024-07-03 (×2): qty 60

## 2024-07-03 NOTE — Progress Notes (Signed)
 Orders received to place foley catheter.

## 2024-07-03 NOTE — Consult Note (Signed)
 WOC Nurse Consult Note: patient has a history of lymphedema, venous insufficiency and lipodermatosclerosis followed at wound care center for wounds to B lower legs and L calcaneus; last seen 06/14/2024 using Xeroform gauze  Reason for Consult: multiple chronic wounds  Wound type: 1.  Deep Tissue Pressure Injury R heel/plantar foot purple maroon discoloration  2.  Stage 1 Pressure Injury mid back; distal to this area is concerning for deep tissue pressure injury mid and lateral  3.  Stage 3 Pressure Injury L buttock 90% red moist 10% tan; Stage 3 Pressure injury R lower buttock 2 round areas one linear separated by intact skin 50% red 50% yellow  4.  Intertriginous dermatitis B inner thighs/groin and underneath pannus  erythema with scattered partial thickness skin loss 50% pink 50% yellow  5. Moisture Associated skin damage R inner thigh erythema with partial thickness skin loss  ICD-10 CM Codes for Irritant Dermatitis L24A2 - Due to fecal, urinary or dual incontinence   L30.4  - Erythema intertrigo. Also used for abrasion of the hand, chafing of the skin, dermatitis due to sweating and friction, friction dermatitis, friction eczema, and genital/thigh intertrigo.  6.  Full thickness wounds B lower legs r/t venous insufficiency and lymphedema red moist with dark areas scattered consistent with lipodermatosclerosis  7 L calcaneus deep tissue pressure injury with developing eschar    Pressure Injury POA: Yes Measurement: see nursing flowsheet Wound bed: as above  Drainage (amount, consistency, odor) see nursing flowsheet  Periwound: Dressing procedure/placement/frequency:  Cleanse B heels with soap and water, dry and apply Xeroform gauze (Lawson [phone]) to wound beds daily, cover with silicone foam or Kerlix roll gauze whichever is preferred. Place B heels in Prevalon boots to offload pressure Soila [phone])  Cleanse B buttocks wounds with Vashe, do not rinse and allow to air dry. Apply Xeroform  gauze (Lawson [phone]) to wound beds daily and secure with silicone foam. Cleanse inner thighs/groin/upper legs with soap and water, dry and apply Gerhardt's Butt Cream 2 times a day and prn soiling.  Cleanse underneath pannus with soap and water, dry and apply Interdry [employer] # [phone] Measure and cut length of InterDry to fit in skin folds that have skin breakdown   Tuck InterDry fabric into skin folds in a single layer, allow for 2 inches of overhang from skin edges to allow for wicking to occur May remove to bathe; dry area thoroughly and then tuck into affected areas again Do not apply any creams or ointments when using InterDry DO NOT THROW AWAY FOR 5 DAYS unless soiled with stool DO NOT [facility] product, this will inactivate the silver  in the material  New sheet of Interdry should be applied after 5 days of use if patient continues to have skin breakdown   Cleanse wounds to B lower legs with Vashe, apply Xeroform gauze (Lawson [phone]) to wound beds daily, cover with ABD pad and secure with Kerlix roll gauze beginning right above toes and ending below knees. Apply Ace bandage wrapped in same fashion as Kerlix for light compression.  POC discussed with bedside nurse. WOC team will not follow. Reconsult if further needs arise.  Thank you,    Powell Bar MSN, RN-BC, [employer]
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
## 2024-07-03 NOTE — Progress Notes (Signed)
 Informed pts daughter [name] via telephone pt is now hypotensive requiring levophed gtt.  She was appreciative to receive an update.  Will continue to monitor and assess pt   [name], [facility]  Pulmonary/Critical Care Pager [phone] (please enter 7 digits) PCCM Consult Pager [phone] (please enter 7 digits)

## 2024-07-03 NOTE — Sepsis Progress Note (Addendum)
 eLink Tracking per protocol.

## 2024-07-03 NOTE — ED Triage Notes (Signed)
 Pt via EMS from [facility], staff called EMS due to alter mental status. Pt was hypotensive at 107/71 for EMS.  Pt has severe edema bilateral in upper legs. Pt has open wounds bilaterally in lower extremities.

## 2024-07-03 NOTE — Progress Notes (Signed)
 Provider notified of hypotension and ordered to initiate Levophed gtt.

## 2024-07-03 NOTE — Progress Notes (Signed)
 CODE SEPSIS - PHARMACY COMMUNICATION  **Broad Spectrum Antibiotics should be administered within 1 hour of Sepsis diagnosis**  Time Code Sepsis Called/Page Received: 1025  Antibiotics Ordered: Cefepime , vancomycin , flagyl   Time of 1st antibiotic administration: 1110  Additional action taken by pharmacy: Messaged RN for height and weight @ 1035. Messaged RN about time left to give antibiotics @ 1105  If necessary, Name of Provider/Nurse Contacted: Bonnita Dy RN   Estill CHRISTELLA Lutes, PharmD, BCPS Clinical Pharmacist 07/03/2024 10:31 AM

## 2024-07-03 NOTE — Progress Notes (Signed)
 PHARMACY - PHYSICIAN COMMUNICATION CRITICAL VALUE ALERT - BLOOD CULTURE IDENTIFICATION (BCID)  [name] is an 78 y.o. female who presented to [facility] on 07/03/2024 with a chief complaint of sepsis from UTI, cellulitis, Afib  Assessment:  4 of 4 bottles, ESBL E. coli Source: UA consistent with UTI  Name of physician (or Provider) Contacted: Ouma  Current antibiotics: Cefepime , Vancomycin   Changes to prescribed antibiotics recommended:  Recommendations accepted by provider. Switching cefepime  to meropenem  Results for orders placed or performed during the hospital encounter of 07/03/24  Blood Culture ID Panel (Reflexed) (Collected: 07/03/2024 10:46 AM)  Result Value Ref Range   Enterococcus faecalis NOT DETECTED NOT DETECTED   Enterococcus Faecium NOT DETECTED NOT DETECTED   Listeria monocytogenes NOT DETECTED NOT DETECTED   Staphylococcus species NOT DETECTED NOT DETECTED   Staphylococcus aureus (BCID) NOT DETECTED NOT DETECTED   Staphylococcus epidermidis NOT DETECTED NOT DETECTED   Staphylococcus lugdunensis NOT DETECTED NOT DETECTED   Streptococcus species NOT DETECTED NOT DETECTED   Streptococcus agalactiae NOT DETECTED NOT DETECTED   Streptococcus pneumoniae NOT DETECTED NOT DETECTED   Streptococcus pyogenes NOT DETECTED NOT DETECTED   A.calcoaceticus-baumannii NOT DETECTED NOT DETECTED   Bacteroides fragilis NOT DETECTED NOT DETECTED   Enterobacterales DETECTED (A) NOT DETECTED   Enterobacter cloacae complex NOT DETECTED NOT DETECTED   Escherichia coli DETECTED (A) NOT DETECTED   Klebsiella aerogenes NOT DETECTED NOT DETECTED   Klebsiella oxytoca NOT DETECTED NOT DETECTED   Klebsiella pneumoniae NOT DETECTED NOT DETECTED   Proteus species NOT DETECTED NOT DETECTED   Salmonella species NOT DETECTED NOT DETECTED   Serratia marcescens NOT DETECTED NOT DETECTED   Haemophilus influenzae NOT DETECTED NOT DETECTED   Neisseria meningitidis NOT DETECTED NOT DETECTED    Pseudomonas aeruginosa NOT DETECTED NOT DETECTED   Stenotrophomonas maltophilia NOT DETECTED NOT DETECTED   Candida albicans NOT DETECTED NOT DETECTED   Candida auris NOT DETECTED NOT DETECTED   Candida glabrata NOT DETECTED NOT DETECTED   Candida krusei NOT DETECTED NOT DETECTED   Candida parapsilosis NOT DETECTED NOT DETECTED   Candida tropicalis NOT DETECTED NOT DETECTED   Cryptococcus neoformans/gattii NOT DETECTED NOT DETECTED   CTX-M ESBL DETECTED (A) NOT DETECTED   Carbapenem resistance IMP NOT DETECTED NOT DETECTED   Carbapenem resistance KPC NOT DETECTED NOT DETECTED   Carbapenem resistance NDM NOT DETECTED NOT DETECTED   Carbapenem resist OXA 48 LIKE NOT DETECTED NOT DETECTED   Carbapenem resistance VIM NOT DETECTED NOT DETECTED    [name] 07/03/2024  8:30 PM

## 2024-07-03 NOTE — ED Provider Notes (Signed)
 [facility] Provider Note    Event Date/Time   First MD Initiated Contact with Patient 07/03/24 1021     (approximate)   History   Altered Mental Status   HPI  [name] is a 78 y.o. female with extensive past medical history including chronic kidney disease, CHF, PAD who presents with altered mental status, she is unable to provide any significant history.  EMS reports hypotensive on arrival but blood pressure initially normal here.     Physical Exam   Triage Vital Signs: ED Triage Vitals  Encounter Vitals Group     BP 07/03/24 1033 103/76     Girls Systolic BP Percentile --      Girls Diastolic BP Percentile --      Boys Systolic BP Percentile --      Boys Diastolic BP Percentile --      Pulse Rate 07/03/24 1033 (!) 119     Resp 07/03/24 1123 17     Temp 07/03/24 1033 99.4 F (37.4 C)     Temp Source 07/03/24 1033 Oral     SpO2 07/03/24 1123 99 %     Weight 07/03/24 1032 109.6 kg (241 lb 9.6 oz)     Height 07/03/24 1032 1.676 m (5' 6)     Head Circumference --      Peak Flow --      Pain Score --      Pain Loc --      Pain Education --      Exclude from Growth Chart --     Most recent vital signs: Vitals:   07/03/24 1201 07/03/24 1202  BP:    Pulse:    Resp: (!) 24 (!) 29  Temp: (!) 102.2 F (39 C) (!) 102.1 F (38.9 C)  SpO2:       General: Awake, not answering questions appropriately CV:  Good peripheral perfusion.  Tachycardia, irregular Resp:  Normal effort.  No significant tachypnea on my exam Abd:  No distention.  Soft, nontender Other:  Patient has erythema to the left lower leg most consistent with cellulitis   ED Results / Procedures / Treatments   Labs (all labs ordered are listed, but only abnormal results are displayed) Labs Reviewed  LACTIC ACID, PLASMA - Abnormal; Notable for the following components:      Result Value   Lactic Acid, Venous 6.4 (*)    All other components within normal limits   CBC WITH DIFFERENTIAL/PLATELET - Abnormal; Notable for the following components:   WBC 0.5 (*)    Platelets 147 (*)    nRBC 35.3 (*)    Neutro Abs 0.3 (*)    Lymphs Abs 0.1 (*)    Monocytes Absolute 0.0 (*)    All other components within normal limits  URINALYSIS, W/ REFLEX TO CULTURE (INFECTION SUSPECTED) - Abnormal; Notable for the following components:   Color, Urine AMBER (*)    APPearance CLOUDY (*)    Glucose, UA >=500 (*)    Leukocytes,Ua LARGE (*)    Bacteria, UA MANY (*)    All other components within normal limits  PROTIME-INR - Abnormal; Notable for the following components:   Prothrombin Time 26.5 (*)    INR 2.3 (*)    All other components within normal limits  COMPREHENSIVE METABOLIC PANEL WITH GFR - Abnormal; Notable for the following components:   Chloride 95 (*)    Glucose, Bld 154 (*)    BUN 45 (*)  Creatinine, Ser 1.66 (*)    Calcium  8.5 (*)    Total Protein 5.8 (*)    Albumin 1.8 (*)    GFR, Estimated 31 (*)    Anion gap 20 (*)    All other components within normal limits  RESP PANEL BY RT-PCR (RSV, FLU A&B, COVID)  [id number]  CULTURE, BLOOD (ROUTINE X 2)  CULTURE, BLOOD (ROUTINE X 2)  URINE CULTURE  LACTIC ACID, PLASMA  PATHOLOGIST SMEAR REVIEW     EKG    ED ECG REPORT I, Lamar Price, the attending physician, personally viewed and interpreted this ECG.  Date: 07/03/2024  Rhythm: Atrial fibrillation rate QRS Axis: normal Intervals: Abnormal ST/T Wave abnormalities: normal Narrative Interpretation: Rapid ventricular response   RADIOLOGY Chest x-ray with no acute abnormality    PROCEDURES:  Critical Care performed: yes  CRITICAL CARE Performed by: Lamar Price   Total critical care time: 30 minutes  Critical care time was exclusive of separately billable procedures and treating other patients.  Critical care was necessary to treat or prevent imminent or life-threatening deterioration.  Critical care was time spent  personally by me on the following activities: development of treatment plan with patient and/or surrogate as well as nursing, discussions with consultants, evaluation of patient's response to treatment, examination of patient, obtaining history from patient or surrogate, ordering and performing treatments and interventions, ordering and review of laboratory studies, ordering and review of radiographic studies, pulse oximetry and re-evaluation of patient's condition.   Procedures   MEDICATIONS ORDERED IN ED: Medications  metroNIDAZOLE (FLAGYL) IVPB 500 mg (500 mg Intravenous New Bag/Given 07/03/24 1152)  lactated ringers  infusion ( Intravenous New Bag/Given 07/03/24 1217)  sodium chloride  0.9 % bolus 1,000 mL (0 mLs Intravenous Stopped 07/03/24 1228)  ceFEPIme  (MAXIPIME ) 2 g in sodium chloride  0.9 % 100 mL IVPB (0 g Intravenous Stopped 07/03/24 1202)  vancomycin  (VANCOCIN ) IVPB 1000 mg/200 mL premix (0 mg Intravenous Stopped 07/03/24 1222)  sodium chloride  0.9 % bolus 1,000 mL (1,000 mLs Intravenous New Bag/Given 07/03/24 1200)    And  sodium chloride  0.9 % bolus 800 mL (800 mLs Intravenous New Bag/Given 07/03/24 1201)     IMPRESSION / MDM / ASSESSMENT AND PLAN / ED COURSE  I reviewed the triage vital signs and the nursing notes. Patient's presentation is most consistent with acute presentation with potential threat to life or bodily function.  Patient presents with altered mental status, tachycardic with borderline blood pressure reported by EMS.  High concern for sepsis especially given erythema of the left lower leg which appears consistent with cellulitis  Will activate code sepsis, broad-spectrum antibiotics initiated.  Notified by nurse of hypotension  30 mL/kg based on ideal body weight for septic shock ordered  Blood pressure has improved after fluids  Lab work is significant for lactic acid of 6.4, white blood cell count of 0.5  Urinalysis also consistent with urinary tract  infection, she is already receiving IV Rocephin   Discussed with Dr. [name] of the ICU, he will admit the patient  ED Sepsis - Repeat Assessment   Performed at:    12:30pm  Last Vitals:    Blood pressure 91/63, pulse (!) 119, temperature (!) 102.1 F (38.9 C), resp. rate (!) 29, height 1.676 m (5' 6), weight 109.6 kg, SpO2 99%.  Heart:      Irregular, tachy  Lungs:     Ctab/l  Capillary Refill:   normal  Peripheral Pulse (include location): 2 + right radial   Skin (include  color):   pink        FINAL CLINICAL IMPRESSION(S) / ED DIAGNOSES   Final diagnoses:  Septic shock (HCC)  Cellulitis of left lower extremity  Lower urinary tract infectious disease     Rx / DC Orders   ED Discharge Orders     None        Note:  This document was prepared using Dragon voice recognition software and may include unintentional dictation errors.   Arlander Charleston, MD 07/03/24 [phone]

## 2024-07-03 NOTE — Progress Notes (Addendum)
 Upon arrival to unit the patient presented with multiple wounds and areas of moisture associated skin damage as well as ecchymosis; patient was in significant pain on arrival to unit and oral mucosa noted to be cracked and dried.  Wounds documented and oral care and moisture done.  Ecchymosis wounds are as follows:  right and left inner thigh, right and left inner anterior thigh, all red in color.  Ecchymosis to the right posterior upper arm, red in color. Left panis with excoriation to the left side with moisture associated associated skin damage and right panis present with moisture associated skin damage. Ecchymosis to the left anterior lower leg with brown and red discoloration, left upper outer leg red in color, left lower abdomen red in color, and left arm below the elbow on the outer portion with red discoloration.

## 2024-07-03 NOTE — Sepsis Progress Note (Signed)
 Alerted provided r/t flag for 3rd lactate.

## 2024-07-03 NOTE — Plan of Care (Signed)
 Continuing with plan of care.

## 2024-07-03 NOTE — Consult Note (Signed)
 Pharmacy Antibiotic Note  [name] is a 78 y.o. female admitted on 07/03/2024 with cellulitis.  Pharmacy has been consulted for vancomycin  and cefepime  dosing.  AKI, Scr = 1.66 (Scr 11/09/23 = 0.76)  Vancomycin  1000 mg IV x 1 given 10/26 @ 1121   Plan: Give additional vancomycin  1250 mg IV x 1 to complete loading dose Due to AKI will use vancomycin  variable dosing Start cefepime  2 grams IV every 12 hours Follow renal function and cultures for adjustments  Height: 5' 6 (167.6 cm) Weight: 109.6 kg (241 lb 9.6 oz) IBW/kg (Calculated) : 59.3  Temp (24hrs), Avg:101.9 F (38.8 C), Min:99.4 F (37.4 C), Max:102.3 F (39.1 C)  Recent Labs  Lab 07/03/24 1045 07/03/24 1056 07/03/24 1256  WBC 0.5*  --   --   CREATININE  --  1.66*  --   LATICACIDVEN 6.4*  --  8.9*    Estimated Creatinine Clearance: 35 mL/min (A) (by C-G formula based on SCr of 1.66 mg/dL (H)).    No Known Allergies  Antimicrobials this admission: Cefepime  10/26 >>  vancomycin  10/26 >>    Microbiology results: 10/26 BCx: pending 10/26 UCx: pending  10/26 MRSA PCR: pending  Thank you for allowing pharmacy to be a part of this patient's care.  Kayla JULIANNA Blew, PharmD 07/03/2024 1:51 PM

## 2024-07-03 NOTE — Consult Note (Signed)
 PHARMACY CONSULT NOTE - ELECTROLYTES  Pharmacy Consult for Electrolyte Monitoring and Replacement   Recent Labs: Height: 5' 6 (167.6 cm) Weight: 106.7 kg (235 lb 3.7 oz) IBW/kg (Calculated) : 59.3 Estimated Creatinine Clearance: 34.5 mL/min (A) (by C-G formula based on SCr of 1.66 mg/dL (H)). Potassium (mmol/L)  Date Value  07/03/2024 4.1  03/22/2014 3.4 (L)   Magnesium (mg/dL)  Date Value  89/73/7974 1.7  03/22/2014 1.9   Calcium  (mg/dL)  Date Value  89/73/7974 8.5 (L)   Calcium , Total (mg/dL)  Date Value  92/84/7984 8.2 (L)   Albumin (g/dL)  Date Value  89/73/7974 1.8 (L)  08/22/2012 3.6   Phosphorus (mg/dL)  Date Value  89/73/7974 3.5   Sodium (mmol/L)  Date Value  07/03/2024 140  10/01/2023 144  03/22/2014 143   Corrected Ca: 10.3 mg/dL  Assessment  [name] is a 78 y.o. female presenting with sepsis. PMH significant for dCHF, CKD, PVD, HTN, and breast cancer. Pharmacy has been consulted to monitor and replace electrolytes.  Diet: n/a MIVF: LR @ 150 mL/hr Pertinent medications: n/a  Goal of Therapy: Electrolytes WNL  Plan:  Mg = 1.7, give MagSulf 2 grams IV x 1 Check BMP, Mg, Phos with AM labs  Thank you for allowing pharmacy to be a part of this patient's care.  Kayla JULIANNA Blew, PharmD Clinical Pharmacist 07/03/2024 2:34 PM

## 2024-07-03 NOTE — ED Notes (Signed)
 Pt was cleaned and linen changed. Upon assessment, pt has quarter size wound on left buttocks. Pt has multiple other small wound areas seen on her right inner thigh.
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
## 2024-07-04 ENCOUNTER — Inpatient Hospital Stay

## 2024-07-04 LAB — GLUCOSE, CAPILLARY
Glucose-Capillary: 105 mg/dL — ABNORMAL HIGH (ref 70–99)
Glucose-Capillary: 105 mg/dL — ABNORMAL HIGH (ref 70–99)
Glucose-Capillary: 109 mg/dL — ABNORMAL HIGH (ref 70–99)
Glucose-Capillary: 117 mg/dL — ABNORMAL HIGH (ref 70–99)
Glucose-Capillary: 130 mg/dL — ABNORMAL HIGH (ref 70–99)
Glucose-Capillary: 151 mg/dL — ABNORMAL HIGH (ref 70–99)
Glucose-Capillary: 81 mg/dL (ref 70–99)
Glucose-Capillary: 91 mg/dL (ref 70–99)

## 2024-07-04 LAB — RESPIRATORY PANEL BY PCR

## 2024-07-04 LAB — CBC
HCT: 36 % (ref 36.0–46.0)
Hemoglobin: 11.2 g/dL — ABNORMAL LOW (ref 12.0–15.0)
MCH: 28.4 pg (ref 26.0–34.0)
MCHC: 31.1 g/dL (ref 30.0–36.0)
MCV: 91.4 fL (ref 80.0–100.0)
Platelets: 104 K/uL — ABNORMAL LOW (ref 150–400)
RBC: 3.94 MIL/uL (ref 3.87–5.11)
RDW: 15.4 % (ref 11.5–15.5)
WBC: 4.3 K/uL (ref 4.0–10.5)
nRBC: 4.9 % — ABNORMAL HIGH (ref 0.0–0.2)

## 2024-07-04 LAB — BLOOD GAS, ARTERIAL
Acid-base deficit: 9.6 mmol/L — ABNORMAL HIGH (ref 0.0–2.0)
Bicarbonate: 17.6 mmol/L — ABNORMAL LOW (ref 20.0–28.0)
O2 Saturation: 99 %
Patient temperature: 37
pCO2 arterial: 42 mmHg (ref 32–48)
pH, Arterial: 7.23 — ABNORMAL LOW (ref 7.35–7.45)
pO2, Arterial: 158 mmHg — ABNORMAL HIGH (ref 83–108)

## 2024-07-04 LAB — BASIC METABOLIC PANEL WITH GFR
Anion gap: 13 (ref 5–15)
BUN: 45 mg/dL — ABNORMAL HIGH (ref 8–23)
CO2: 19 mmol/L — ABNORMAL LOW (ref 22–32)
Calcium: 7.2 mg/dL — ABNORMAL LOW (ref 8.9–10.3)
Chloride: 103 mmol/L (ref 98–111)
Creatinine, Ser: 1.33 mg/dL — ABNORMAL HIGH (ref 0.44–1.00)
GFR, Estimated: 41 mL/min — ABNORMAL LOW (ref >60)
Glucose, Bld: 194 mg/dL — ABNORMAL HIGH (ref 70–99)
Potassium: 3.8 mmol/L (ref 3.5–5.1)
Sodium: 135 mmol/L (ref 135–145)

## 2024-07-04 LAB — PROTIME-INR
INR: 1.9 — ABNORMAL HIGH (ref 0.8–1.2)
Prothrombin Time: 23.1 s — ABNORMAL HIGH (ref 11.4–15.2)

## 2024-07-04 LAB — VANCOMYCIN, RANDOM: Vancomycin Rm: 16 ug/mL

## 2024-07-04 LAB — MAGNESIUM: Magnesium: 1.8 mg/dL (ref 1.7–2.4)

## 2024-07-04 LAB — LACTIC ACID, PLASMA
Lactic Acid, Venous: 4.3 mmol/L (ref 0.5–1.9)
Lactic Acid, Venous: 4.2 mmol/L (ref 0.5–1.9)

## 2024-07-04 LAB — PHOSPHORUS: Phosphorus: 5 mg/dL — ABNORMAL HIGH (ref 2.5–4.6)

## 2024-07-04 LAB — PATHOLOGIST SMEAR REVIEW

## 2024-07-04 MED ORDER — PANTOPRAZOLE SODIUM 40 MG IV SOLR
40.0000 mg | INTRAVENOUS | Status: DC
  Administered 2024-07-04 – 2024-07-20 (×17): 40 mg via INTRAVENOUS
  Filled 2024-07-04 (×17): qty 10

## 2024-07-04 MED ORDER — DEXTROSE 50 % IV SOLN
12.5000 g | Freq: Once | INTRAVENOUS | Status: AC
  Administered 2024-07-04: 12.5 g via INTRAVENOUS
  Filled 2024-07-04: qty 50

## 2024-07-04 MED ORDER — HYDROCORTISONE SOD SUC (PF) 100 MG IJ SOLR
100.0000 mg | Freq: Three times a day (TID) | INTRAMUSCULAR | Status: DC
  Administered 2024-07-04 – 2024-07-09 (×14): 100 mg via INTRAVENOUS
  Filled 2024-07-04 (×14): qty 2

## 2024-07-04 MED ORDER — HYDROCORTISONE SOD SUC (PF) 100 MG IJ SOLR
50.0000 mg | Freq: Three times a day (TID) | INTRAMUSCULAR | Status: DC
  Administered 2024-07-04 (×2): 50 mg via INTRAVENOUS
  Filled 2024-07-04 (×2): qty 2

## 2024-07-04 MED ORDER — LACTATED RINGERS IV BOLUS
500.0000 mL | Freq: Once | INTRAVENOUS | Status: AC
  Administered 2024-07-04: 500 mL via INTRAVENOUS

## 2024-07-04 MED ORDER — FENTANYL CITRATE (PF) 50 MCG/ML IJ SOSY
50.0000 ug | PREFILLED_SYRINGE | INTRAMUSCULAR | Status: DC | PRN
  Administered 2024-07-04 (×3): 50 ug via INTRAVENOUS
  Filled 2024-07-04 (×4): qty 1

## 2024-07-04 MED ORDER — ORAL CARE MOUTH RINSE: 15.0000 mL | OROMUCOSAL | Status: DC | PRN

## 2024-07-04 MED ORDER — MAGNESIUM SULFATE 2 GM/50ML IV SOLN
2.0000 g | Freq: Once | INTRAVENOUS | Status: AC
  Administered 2024-07-04: 2 g via INTRAVENOUS
  Filled 2024-07-04: qty 50

## 2024-07-04 MED ORDER — NOREPINEPHRINE 16 MG/250ML-% IV SOLN
0.0000 ug/min | INTRAVENOUS | Status: DC
Start: 2024-07-04 — End: 2024-07-08
  Administered 2024-07-04: 18 ug/min via INTRAVENOUS
  Administered 2024-07-05: 9 ug/min via INTRAVENOUS
  Administered 2024-07-05: 8 ug/min via INTRAVENOUS
  Filled 2024-07-04 (×2): qty 250

## 2024-07-04 MED ORDER — VASOPRESSIN 20 UNITS/100 ML INFUSION FOR SHOCK
0.0000 [IU]/min | INTRAVENOUS | Status: DC
  Administered 2024-07-04: 0.03 [IU]/min via INTRAVENOUS
  Filled 2024-07-04: qty 100

## 2024-07-04 MED ORDER — FENTANYL CITRATE (PF) 50 MCG/ML IJ SOSY
25.0000 ug | PREFILLED_SYRINGE | Freq: Once | INTRAMUSCULAR | Status: AC
  Administered 2024-07-05: 25 ug via INTRAVENOUS
  Filled 2024-07-04: qty 1

## 2024-07-04 MED ORDER — VASOPRESSIN 20 UNITS/100 ML INFUSION FOR SHOCK
0.0000 [IU]/min | INTRAVENOUS | Status: DC
  Administered 2024-07-04 (×2): 0.03 [IU]/min via INTRAVENOUS
  Filled 2024-07-04 (×2): qty 100

## 2024-07-04 MED ORDER — ORAL CARE MOUTH RINSE
15.0000 mL | OROMUCOSAL | Status: DC
  Administered 2024-07-04 – 2024-07-08 (×16): 15 mL via OROMUCOSAL
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
## 2024-07-04 NOTE — Procedures (Addendum)
 ARTERIAL CATHETER INSERTION PROCEDURE NOTE  Melanie Openshaw  [id number]  1946-06-28  Date:07/04/24  Time:5:55 AM    Provider Performing: Almarie DELENA Nose   Procedure: Insertion of Arterial Line (63379) with US  guidance (23062)   Indication(s) Blood pressure monitoring and/or need for frequent ABGs  Consent Risks of the procedure as well as the alternatives and risks of each were explained to the patient and/or caregiver.  Consent for the procedure was obtained and is signed in the bedside chart  Anesthesia None  Time Out Verified patient identification, verified procedure, site/side was marked, verified correct patient position, special equipment/implants available, medications/allergies/relevant history reviewed, required imaging and test results available.  Sterile Technique Maximal sterile technique including full sterile barrier drape, hand hygiene, sterile gown, sterile gloves, mask, hair covering, sterile ultrasound probe cover (if used).  Procedure Description Area of catheter insertion was cleaned with chlorhexidine  and draped in sterile fashion. With real-time ultrasound guidance an arterial catheter was placed into the left femoral artery.  Appropriate arterial tracings confirmed on monitor.    Complications/Tolerance None; patient tolerated the procedure well.  EBL Minimal  Specimen(s) None   [name], DNP, CCRN, FNP-C, AGACNP-BC Acute Care & Family Nurse Practitioner  [facility]  See Amion for personal pager PCCM on call pager [phone] until 7 am

## 2024-07-04 NOTE — Consult Note (Signed)
 PHARMACY CONSULT NOTE - ELECTROLYTES  Pharmacy Consult for Electrolyte Monitoring and Replacement   Recent Labs: Height: 5' 6 (167.6 cm) Weight: 111.6 kg (246 lb 0.5 oz) IBW/kg (Calculated) : 59.3 Estimated Creatinine Clearance: 44.1 mL/min (A) (by C-G formula based on SCr of 1.33 mg/dL (H)). Potassium (mmol/L)  Date Value  07/04/2024 3.8  03/22/2014 3.4 (L)   Magnesium (mg/dL)  Date Value  89/72/7974 1.8  03/22/2014 1.9   Calcium  (mg/dL)  Date Value  89/72/7974 7.2 (L)   Calcium , Total (mg/dL)  Date Value  92/84/7984 8.2 (L)   Albumin (g/dL)  Date Value  89/73/7974 1.8 (L)  08/22/2012 3.6   Phosphorus (mg/dL)  Date Value  89/72/7974 5.0 (H)   Sodium (mmol/L)  Date Value  07/04/2024 135  10/01/2023 144  03/22/2014 143   Corrected Ca: 9.0 mg/dL  Assessment  [name] is a 78 y.o. female presenting with sepsis. PMH significant for dCHF, CKD, PVD, HTN, and breast cancer. Pharmacy has been consulted to monitor and replace electrolytes.  Diet: n/a  MIVF: n/a  Pertinent medications: n/a  Goal of Therapy: Electrolytes WNL  Plan:  MagSulfate 2g IV x 1  Check BMP, Mg, Phos with AM labs  Thank you for allowing pharmacy to be a part of this patient's care.  Bernardino George, PharmD Candidate [phone] [employer] [facility] [employer] 07/04/2024 7:42 AM

## 2024-07-04 NOTE — Procedures (Addendum)
 CENTRAL VENOUS CATHETER INSERTION PROCEDURE NOTE  [name]  [id number]  [birth year]  Date:07/04/24  Time:5:54 AM   Provider Performing:Sumaiyah Markert A Francys Bolin   Procedure: Insertion of Non-tunneled Central Venous [phone]) with US  guidance (23062)   Indication(s) Medication administration and Difficult access  Consent Risks of the procedure as well as the alternatives and risks of each were explained to the patient and/or caregiver.  Consent for the procedure was obtained and is signed in the bedside chart  Anesthesia Topical only with 1% lidocaine    Timeout Verified patient identification, verified procedure, site/side was marked, verified correct patient position, special equipment/implants available, medications/allergies/relevant history reviewed, required imaging and test results available.  Sterile Technique Maximal sterile technique including full sterile barrier drape, hand hygiene, sterile gown, sterile gloves, mask, hair covering, sterile ultrasound probe cover (if used).  Procedure Description Area of catheter insertion was cleaned with chlorhexidine  and draped in sterile fashion.  With real-time ultrasound guidance a central venous catheter was placed into the left femoral vein. Nonpulsatile blood flow and easy flushing noted in all ports.  The catheter was sutured in place and sterile dressing applied.  Complications/Tolerance None; patient tolerated the procedure well. Chest X-ray is ordered to verify placement for internal jugular or subclavian cannulation.   Chest x-ray is not ordered for femoral cannulation.  EBL Minimal  Specimen(s) None  [name] Nose, DNP, CCRN, FNP-C, AGACNP-BC Acute Care & Family Nurse Practitioner  [facility]  See Amion for personal pager PCCM on call pager [phone] until 7 am

## 2024-07-04 NOTE — Progress Notes (Signed)
 Hypoglycemic Event  CBG: 64  Treatment: D50 25 mL (12.5 gm)  Symptoms: None  Follow-up CBG: Time:0050 CBG Result:117  Possible Reasons for Event: Other:    Comments/MD notified:Elizabeth NP notified.    Ruqayyah Lute E Te Vazquez

## 2024-07-04 NOTE — Consult Note (Signed)
    Patient was not seen.  CT images were reviewed. 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  I reviewed the CT and small amount of air noted.  No evidence of hydronephrosis or emphysematous pyelonephritis.  Bladder was slightly distended however a Foley catheter has been placed.  Discussed with Inge Lecher no additional treatment needed other than IV antibiotics

## 2024-07-05 ENCOUNTER — Inpatient Hospital Stay (HOSPITAL_COMMUNITY)
Admit: 2024-07-05 | Discharge: 2024-07-05 | Disposition: A | Discharge status: Home / Self Care | Attending: Pulmonary Disease | Admitting: Pulmonary Disease

## 2024-07-05 ENCOUNTER — Encounter: Admitting: Physician Assistant

## 2024-07-05 DIAGNOSIS — Z7189 Other specified counseling: Secondary | ICD-10-CM | POA: Diagnosis not present

## 2024-07-05 DIAGNOSIS — R578 Other shock: Secondary | ICD-10-CM

## 2024-07-05 LAB — BASIC METABOLIC PANEL WITH GFR
Anion gap: 10 (ref 5–15)
BUN: 49 mg/dL — ABNORMAL HIGH (ref 8–23)
CO2: 26 mmol/L (ref 22–32)
Calcium: 7.6 mg/dL — ABNORMAL LOW (ref 8.9–10.3)
Chloride: 102 mmol/L (ref 98–111)
Creatinine, Ser: 1.19 mg/dL — ABNORMAL HIGH (ref 0.44–1.00)
GFR, Estimated: 47 mL/min — ABNORMAL LOW (ref >60)
Glucose, Bld: 94 mg/dL (ref 70–99)
Potassium: 4 mmol/L (ref 3.5–5.1)
Sodium: 138 mmol/L (ref 135–145)

## 2024-07-05 LAB — CBC WITH DIFFERENTIAL/PLATELET
Abs Immature Granulocytes: 0.3 K/uL — ABNORMAL HIGH (ref 0.00–0.07)
Basophils Absolute: 0 K/uL (ref 0.0–0.1)
Basophils Relative: 0 %
Eosinophils Absolute: 0 K/uL (ref 0.0–0.5)
Eosinophils Relative: 0 %
HCT: 34.6 % — ABNORMAL LOW (ref 36.0–46.0)
Hemoglobin: 11.3 g/dL — ABNORMAL LOW (ref 12.0–15.0)
Immature Granulocytes: 2 %
Lymphocytes Relative: 2 %
Lymphs Abs: 0.2 K/uL — ABNORMAL LOW (ref 0.7–4.0)
MCH: 28.4 pg (ref 26.0–34.0)
MCHC: 32.7 g/dL (ref 30.0–36.0)
MCV: 86.9 fL (ref 80.0–100.0)
Monocytes Absolute: 0.2 K/uL (ref 0.1–1.0)
Monocytes Relative: 2 %
Neutro Abs: 12.1 K/uL — ABNORMAL HIGH (ref 1.7–7.7)
Neutrophils Relative %: 94 %
Platelets: 33 K/uL — ABNORMAL LOW (ref 150–400)
RBC: 3.98 MIL/uL (ref 3.87–5.11)
RDW: 15.6 % — ABNORMAL HIGH (ref 11.5–15.5)
Smear Review: NORMAL
WBC: 12.8 K/uL — ABNORMAL HIGH (ref 4.0–10.5)
nRBC: 0.4 % — ABNORMAL HIGH (ref 0.0–0.2)

## 2024-07-05 LAB — CBC
HCT: 34 % — ABNORMAL LOW (ref 36.0–46.0)
Hemoglobin: 11.1 g/dL — ABNORMAL LOW (ref 12.0–15.0)
MCH: 28.8 pg (ref 26.0–34.0)
MCHC: 32.6 g/dL (ref 30.0–36.0)
MCV: 88.1 fL (ref 80.0–100.0)
Platelets: 36 K/uL — ABNORMAL LOW (ref 150–400)
RBC: 3.86 MIL/uL — ABNORMAL LOW (ref 3.87–5.11)
RDW: 15.5 % (ref 11.5–15.5)
WBC: 12.2 K/uL — ABNORMAL HIGH (ref 4.0–10.5)
nRBC: 0.4 % — ABNORMAL HIGH (ref 0.0–0.2)

## 2024-07-05 LAB — GLUCOSE, CAPILLARY
Glucose-Capillary: 72 mg/dL (ref 70–99)
Glucose-Capillary: 48 mg/dL — ABNORMAL LOW (ref 70–99)
Glucose-Capillary: 197 mg/dL — ABNORMAL HIGH (ref 70–99)
Glucose-Capillary: 58 mg/dL — ABNORMAL LOW (ref 70–99)
Glucose-Capillary: 154 mg/dL — ABNORMAL HIGH (ref 70–99)
Glucose-Capillary: 51 mg/dL — ABNORMAL LOW (ref 70–99)
Glucose-Capillary: 61 mg/dL — ABNORMAL LOW (ref 70–99)
Glucose-Capillary: >600 mg/dL (ref 70–99)
Glucose-Capillary: 92 mg/dL (ref 70–99)
Glucose-Capillary: 196 mg/dL — ABNORMAL HIGH (ref 70–99)
Glucose-Capillary: 139 mg/dL — ABNORMAL HIGH (ref 70–99)
Glucose-Capillary: 202 mg/dL — ABNORMAL HIGH (ref 70–99)

## 2024-07-05 LAB — BLOOD GAS, ARTERIAL
Acid-base deficit: 0.2 mmol/L (ref 0.0–2.0)
Bicarbonate: 25.4 mmol/L (ref 20.0–28.0)
O2 Content: 2 L/min
O2 Saturation: 99.3 %
Patient temperature: 37.7
pCO2 arterial: 45 mmHg (ref 32–48)
pH, Arterial: 7.36 (ref 7.35–7.45)
pO2, Arterial: 101 mmHg (ref 83–108)

## 2024-07-05 LAB — ECHOCARDIOGRAM COMPLETE
AR max vel: 1.3 cm2
AV Area VTI: 1.37 cm2
AV Area mean vel: 1.51 cm2
AV Mean grad: 3 mmHg
AV Peak grad: 7.2 mmHg
Ao pk vel: 1.34 m/s
Area-P 1/2: 8.43 cm2
Height: 66 in
S' Lateral: 4.65 cm
Single Plane A4C EF: 24.6 %
Weight: 3968.28 [oz_av]

## 2024-07-05 LAB — LACTIC ACID, PLASMA: Lactic Acid, Venous: 1.8 mmol/L (ref 0.5–1.9)

## 2024-07-05 LAB — URINE CULTURE: Culture: >=100000 — AB

## 2024-07-05 LAB — PHOSPHORUS: Phosphorus: 5.5 mg/dL — ABNORMAL HIGH (ref 2.5–4.6)

## 2024-07-05 LAB — MAGNESIUM: Magnesium: 2.4 mg/dL (ref 1.7–2.4)

## 2024-07-05 LAB — T3, FREE: T3, Free: 1.5 pg/mL — ABNORMAL LOW (ref 2.0–4.4)

## 2024-07-05 MED ORDER — ACETAMINOPHEN 10 MG/ML IV SOLN
1000.0000 mg | Freq: Once | INTRAVENOUS | Status: AC
  Administered 2024-07-05: 1000 mg via INTRAVENOUS
  Filled 2024-07-05: qty 100

## 2024-07-05 MED ORDER — DEXTROSE 50 % IV SOLN
25.0000 g | INTRAVENOUS | Status: AC
  Administered 2024-07-05: 25 g via INTRAVENOUS

## 2024-07-05 MED ORDER — DEXTROSE 50 % IV SOLN
25.0000 g | INTRAVENOUS | Status: AC
  Administered 2024-07-05: 25 g via INTRAVENOUS
  Filled 2024-07-05: qty 50

## 2024-07-05 MED ORDER — DEXTROSE 5 % IV SOLN: INTRAVENOUS | Status: DC

## 2024-07-05 MED ORDER — DEXTROSE 50 % IV SOLN
12.5000 g | INTRAVENOUS | Status: AC
  Administered 2024-07-05: 12.5 g via INTRAVENOUS

## 2024-07-05 MED ORDER — DEXTROSE 50 % IV SOLN
INTRAVENOUS | Status: AC
  Filled 2024-07-05: qty 50

## 2024-07-05 MED ORDER — FENTANYL CITRATE (PF) 50 MCG/ML IJ SOSY
12.5000 ug | PREFILLED_SYRINGE | INTRAMUSCULAR | Status: DC | PRN
  Administered 2024-07-05 – 2024-07-06 (×3): 12.5 ug via INTRAVENOUS
  Filled 2024-07-05 (×4): qty 1

## 2024-07-05 MED ORDER — LACTATED RINGERS IV BOLUS
500.0000 mL | Freq: Once | INTRAVENOUS | Status: AC
  Administered 2024-07-05: 500 mL via INTRAVENOUS
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
## 2024-07-05 NOTE — Consult Note (Signed)
 PHARMACY CONSULT NOTE - ELECTROLYTES  Pharmacy Consult for Electrolyte Monitoring and Replacement   Recent Labs: Height: 5' 6 (167.6 cm) Weight: 112.5 kg (248 lb 0.3 oz) IBW/kg (Calculated) : 59.3 Estimated Creatinine Clearance: 49.6 mL/min (A) (by C-G formula based on SCr of 1.19 mg/dL (H)). Potassium (mmol/L)  Date Value  07/05/2024 4.0  03/22/2014 3.4 (L)   Magnesium (mg/dL)  Date Value  89/71/7974 2.4  03/22/2014 1.9   Calcium  (mg/dL)  Date Value  89/71/7974 7.6 (L)   Calcium , Total (mg/dL)  Date Value  92/84/7984 8.2 (L)   Albumin (g/dL)  Date Value  89/73/7974 1.8 (L)  08/22/2012 3.6   Phosphorus (mg/dL)  Date Value  89/71/7974 5.5 (H)   Sodium (mmol/L)  Date Value  07/05/2024 138  10/01/2023 144  03/22/2014 143   Corrected Ca: 9.4 mg/dL  Assessment  [name] is a 78 y.o. female presenting with sepsis. PMH significant for dCHF, CKD, PVD, HTN, and breast cancer. Pharmacy has been consulted to monitor and replace electrolytes.  Diet: n/a  MIVF: n/a  Pertinent medications: n/a  Goal of Therapy: Electrolytes WNL  Plan:  No electrolyte replacement indicated at this time.  Check BMP, Mg, Phos with AM labs  Thank you for allowing pharmacy to be a part of this patient's care.  Bernardino George, PharmD Candidate [phone] [facility] [employer] 07/05/2024 7:12 AM

## 2024-07-05 NOTE — Progress Notes (Signed)
 Unable to complete pupil assessment. Pt refusing. When asked to open eyes, pt closes them tighter.

## 2024-07-05 NOTE — Plan of Care (Signed)
  Problem: Education: Goal: Ability to describe self-care measures that may prevent or decrease complications (Diabetes Survival Skills Education) will improve Outcome: Not Progressing Goal: Individualized Educational Video(s) Outcome: Not Progressing   Problem: Coping: Goal: Ability to adjust to condition or change in health will improve Outcome: Not Progressing   Problem: Fluid Volume: Goal: Ability to maintain a balanced intake and output will improve Outcome: Not Progressing   Problem: Health Behavior/Discharge Planning: Goal: Ability to identify and utilize available resources and services will improve Outcome: Not Progressing Goal: Ability to manage health-related needs will improve Outcome: Not Progressing   Problem: Metabolic: Goal: Ability to maintain appropriate glucose levels will improve Outcome: Not Progressing   Problem: Nutritional: Goal: Maintenance of adequate nutrition will improve Outcome: Not Progressing Goal: Progress toward achieving an optimal weight will improve Outcome: Not Progressing   Problem: Skin Integrity: Goal: Risk for impaired skin integrity will decrease Outcome: Not Progressing   Problem: Tissue Perfusion: Goal: Adequacy of tissue perfusion will improve Outcome: Not Progressing   Problem: Education: Goal: Knowledge of General Education information will improve Description: Including pain rating scale, medication(s)/side effects and non-pharmacologic comfort measures Outcome: Not Progressing   Problem: Health Behavior/Discharge Planning: Goal: Ability to manage health-related needs will improve Outcome: Not Progressing   Problem: Clinical Measurements: Goal: Ability to maintain clinical measurements within normal limits will improve Outcome: Not Progressing Goal: Will remain free from infection Outcome: Not Progressing Goal: Diagnostic test results will improve Outcome: Not Progressing Goal: Respiratory complications will  improve Outcome: Not Progressing Goal: Cardiovascular complication will be avoided Outcome: Not Progressing   Problem: Activity: Goal: Risk for activity intolerance will decrease Outcome: Not Progressing   Problem: Nutrition: Goal: Adequate nutrition will be maintained Outcome: Not Progressing   Problem: Coping: Goal: Level of anxiety will decrease Outcome: Not Progressing   Problem: Elimination: Goal: Will not experience complications related to bowel motility Outcome: Not Progressing Goal: Will not experience complications related to urinary retention Outcome: Not Progressing   Problem: Pain Managment: Goal: General experience of comfort will improve and/or be controlled Outcome: Not Progressing   Problem: Safety: Goal: Ability to remain free from injury will improve Outcome: Not Progressing   Problem: Skin Integrity: Goal: Risk for impaired skin integrity will decrease Outcome: Not Progressing   Problem: Clinical Measurements: Goal: Ability to avoid or minimize complications of infection will improve Outcome: Not Progressing   Problem: Skin Integrity: Goal: Skin integrity will improve Outcome: Not Progressing

## 2024-07-05 NOTE — TOC Initial Note (Signed)
 Transition of Care [facility]) - Initial/Assessment Note    Patient Details  Name: [name] MRN: [id number] Date of Birth: 1945-12-27  Transition of Care [facility]) CM/SW Contact:    Corrie JINNY Ruts, LCSW Phone Number: 07/05/2024, 3:44 PM  Clinical Narrative:                 Chart reviewed. The patient was admitted for Sepsis. The patient is no alert and oriented. I was able to speak with the patient daughter. I introduced myself, my role, and reason for consult.   The patient daughter reports that she is the patient POA. The patient daughter reports that the patient has a PCP at [facility]. The patient daughter reports that the patient is LTC at [facility] and has been there for 2 years but will not be returning back to [facility] at D/C.   The patient daughter reports that the patient need assistance completing daily living task. The patient daughter reports that the patient uses EMS or travel [facility] for transportation. The patient daughter reports that the patient has had HH in the past but does not remember with who. The patient daughter reports that the patient uses a wheel chair.    The patient daughter had concerns with placing the patient in another SNF. I informed the patient daughter that I would leave a list of SNF for her brother to receive. The patient daughter verbalized understanding.   There are no other TOC needs at this time. TOC will follow the patient until D/C.   Barriers to Discharge: Continued Medical Work up   Patient Goals and CMS Choice     Choice offered to / list presented to : Adult Children, HC POA / Guardian      Expected Discharge Plan and Services       Living arrangements for the past 2 months: Skilled Nursing Facility                                      Prior Living Arrangements/Services Living arrangements for the past 2 months: Skilled Nursing Facility Lives with:: Facility Resident Patient language and need for interpreter reviewed::  Yes        Need for Family Participation in Patient Care: Yes (Comment)     Criminal Activity/Legal Involvement Pertinent to Current Situation/Hospitalization: No - Comment as needed  Activities of Daily Living      Permission Sought/Granted   Permission granted to share information with : Yes, Release of Information Signed        Permission granted to share info w Relationship: Daughter  Permission granted to share info w Contact Information: Jaidon Sponsel; [phone]  Emotional Assessment   Attitude/Demeanor/Rapport: Unable to Assess Affect (typically observed): Unable to Assess Orientation: : Fluctuating Orientation (Suspected and/or reported Sundowners) Alcohol / Substance Use: Not Applicable Psych Involvement: No (comment)  Admission diagnosis:  Lower urinary tract infectious disease [N39.0] Cellulitis of left lower extremity [L03.116] Septic shock (HCC) [A41.9, R65.21] Sepsis (HCC) [A41.9] Patient Active Problem List   Diagnosis Date Noted   Sepsis (HCC) 07/03/2024   Lower urinary tract infectious disease 07/03/2024   Hypokalemia 04/24/2023   Acute postoperative anemia due to expected blood loss 04/22/2023   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   Hypernatremia 04/20/2023   Acute metabolic encephalopathy 04/20/2023   Hypoglycemia 04/19/2023   Aspiration pneumonia of right lower lobe due to vomit (HCC) 04/19/2023   SBO (  small bowel obstruction) (HCC) 04/18/2023   Coffee ground emesis 04/18/2023   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/18/2023   Peripheral neuropathy 04/18/2023   Acute DVT of right tibial vein (HCC) 03/30/2023   Facial weakness 03/30/2023   DVT (deep venous thrombosis) (HCC) 03/30/2023   Mouth droop due to facial weakness 03/29/2023   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) 03/29/2023   Cellulitis of lower extremity 09/10/2022   Near syncope 08/22/2022   Constipation 08/22/2022   Stage 3a  chronic kidney disease (HCC) 06/13/2022   Ulcers of both lower extremities, with unspecified severity (HCC) 03/03/2022   Obesity (BMI 30-39.9) 03/03/2022   Anemia of chronic disease 03/03/2022   PAD (peripheral artery disease) 05/30/2020   Malignant neoplasm of right female breast (HCC) 05/13/2018   Venous insufficiency of both lower extremities 11/20/2017   Chronic diastolic CHF (congestive heart failure) (HCC) 11/20/2017   Weight gain 11/20/2017   Lymphedema 04/19/2017   Benign essential HTN 04/19/2017   OSA (obstructive sleep apnea) 04/19/2017   Morbid obesity (HCC) 04/19/2017   TIA (transient ischemic attack) 03/28/2017   Urinary incontinence 06/27/2016   Breast cancer (HCC) 09/08/2004   PCP:  Lorel Maxie LABOR, MD Pharmacy:   [facility]. - [place] - [address] [place] [address] Phone: [phone] Fax: [phone]     Social Drivers of Health (SDOH) Social History: SDOH Screenings   Food Insecurity: Patient Unable To Answer (07/04/2024)  Housing: Unknown (07/04/2024)  Transportation Needs: Patient Unable To Answer (07/04/2024)  Utilities: Patient Unable To Answer (07/04/2024)  Social Connections: Patient Unable To Answer (07/04/2024)  Tobacco Use: Medium Risk (07/03/2024)   SDOH Interventions: Food Insecurity Interventions: Patient Unable to Answer Utilities Interventions: Patient Unable to Answer   Readmission Risk Interventions    07/05/2024    3:42 PM 04/18/2023   10:48 AM 09/12/2022   11:09 AM  Readmission Risk Prevention Plan  Transportation Screening Complete Complete   PCP or Specialist Appt within 3-5 Days   Complete  Social Work Consult for Recovery Care Planning/Counseling   Complete  Palliative Care Screening   Not Applicable  Medication Review Oceanographer) Complete Complete   PCP or Specialist appointment within 3-5 days of discharge Complete    HRI or Home Care Consult Complete Complete   SW Recovery  Care/Counseling Consult Complete    Palliative Care Screening Not Applicable    Skilled Nursing Facility Complete Complete

## 2024-07-05 NOTE — Plan of Care (Signed)
  Problem: Coping: Goal: Ability to adjust to condition or change in health will improve Outcome: Not Progressing   Problem: Fluid Volume: Goal: Ability to maintain a balanced intake and output will improve Outcome: Not Progressing   Problem: Health Behavior/Discharge Planning: Goal: Ability to manage health-related needs will improve Outcome: Not Progressing   Problem: Metabolic: Goal: Ability to maintain appropriate glucose levels will improve Outcome: Not Progressing   Problem: Skin Integrity: Goal: Risk for impaired skin integrity will decrease Outcome: Not Progressing   Problem: Tissue Perfusion: Goal: Adequacy of tissue perfusion will improve Outcome: Not Progressing   Problem: Education: Goal: Knowledge of General Education information will improve Description: Including pain rating scale, medication(s)/side effects and non-pharmacologic comfort measures Outcome: Not Progressing
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
## 2024-07-05 NOTE — Care Management Important Message (Signed)
 Important Message  Patient Details  Name: [name] MRN: [id number] Date of Birth: 1945/09/30   Important Message Given:  Yes - Medicare IM     [name] 07/05/2024, 3:29 PM

## 2024-07-05 NOTE — TOC Progression Note (Signed)
 Transition of Care [facility]) - Progression Note    Patient Details  Name: Adiba Fargnoli MRN: [id number] Date of Birth: 1946/07/07  [facility]) CM/SW Contact  K'La JINNY Ruts, LCSW Phone Number: 07/05/2024, 3:16 PM  Clinical Narrative:    Chart reviewed. Patient is not alert and oriented. Will follow up with [facility] or when the patient is able to consult.                      Expected Discharge Plan and Services                                               Social Drivers of Health (SDOH) Interventions SDOH Screenings   Food Insecurity: Patient Unable To Answer (07/04/2024)  Housing: Unknown (07/04/2024)  Transportation Needs: Patient Unable To Answer (07/04/2024)  Utilities: Patient Unable To Answer (07/04/2024)  Social Connections: Patient Unable To Answer (07/04/2024)  Tobacco Use: Medium Risk (07/03/2024)    Readmission Risk Interventions    04/18/2023   10:48 AM 09/12/2022   11:09 AM  Readmission Risk Prevention Plan  Transportation Screening Complete   PCP or Specialist Appt within 3-5 Days  Complete  Social Work Consult for Recovery Care Planning/Counseling  Complete  Palliative Care Screening  Not Applicable  Medication Review Oceanographer) Complete   HRI or Home Care Consult Complete   Skilled Nursing Facility Complete

## 2024-07-06 DIAGNOSIS — Z7189 Other specified counseling: Secondary | ICD-10-CM | POA: Diagnosis not present

## 2024-07-06 LAB — PROTIME-INR
INR: 1.8 — ABNORMAL HIGH (ref 0.8–1.2)
Prothrombin Time: 21.7 s — ABNORMAL HIGH (ref 11.4–15.2)

## 2024-07-06 LAB — CBC
HCT: 34 % — ABNORMAL LOW (ref 36.0–46.0)
Hemoglobin: 10.9 g/dL — ABNORMAL LOW (ref 12.0–15.0)
MCH: 28 pg (ref 26.0–34.0)
MCHC: 32.1 g/dL (ref 30.0–36.0)
MCV: 87.4 fL (ref 80.0–100.0)
Platelets: 9 K/uL — CL (ref 150–400)
RBC: 3.89 MIL/uL (ref 3.87–5.11)
RDW: 15.5 % (ref 11.5–15.5)
WBC: 15.9 K/uL — ABNORMAL HIGH (ref 4.0–10.5)
nRBC: 0.1 % (ref 0.0–0.2)

## 2024-07-06 LAB — CULTURE, BLOOD (ROUTINE X 2)
Culture: NO GROWTH — AB
Culture: NO GROWTH — AB
Special Requests: ADEQUATE
Special Requests: ADEQUATE

## 2024-07-06 LAB — CBC WITH DIFFERENTIAL/PLATELET
Abs Immature Granulocytes: 2.12 K/uL — ABNORMAL HIGH (ref 0.00–0.07)
Abs Immature Granulocytes: 2.9 K/uL — ABNORMAL HIGH (ref 0.00–0.07)
Basophils Absolute: 0 K/uL (ref 0.0–0.1)
Basophils Absolute: 0 K/uL (ref 0.0–0.1)
Basophils Relative: 0 %
Basophils Relative: 0 %
Eosinophils Absolute: 0 K/uL (ref 0.0–0.5)
Eosinophils Absolute: 0 K/uL (ref 0.0–0.5)
Eosinophils Relative: 0 %
Eosinophils Relative: 0 %
HCT: 33.5 % — ABNORMAL LOW (ref 36.0–46.0)
HCT: 32.2 % — ABNORMAL LOW (ref 36.0–46.0)
Hemoglobin: 11.1 g/dL — ABNORMAL LOW (ref 12.0–15.0)
Hemoglobin: 10.8 g/dL — ABNORMAL LOW (ref 12.0–15.0)
Immature Granulocytes: 13 %
Immature Granulocytes: 16 %
Lymphocytes Relative: 1 %
Lymphocytes Relative: 1 %
Lymphs Abs: 0.2 K/uL — ABNORMAL LOW (ref 0.7–4.0)
Lymphs Abs: 0.2 K/uL — ABNORMAL LOW (ref 0.7–4.0)
MCH: 28.5 pg (ref 26.0–34.0)
MCH: 28.7 pg (ref 26.0–34.0)
MCHC: 33.1 g/dL (ref 30.0–36.0)
MCHC: 33.5 g/dL (ref 30.0–36.0)
MCV: 86.1 fL (ref 80.0–100.0)
MCV: 85.6 fL (ref 80.0–100.0)
Monocytes Absolute: 0.2 K/uL (ref 0.1–1.0)
Monocytes Absolute: 0.2 K/uL (ref 0.1–1.0)
Monocytes Relative: 1 %
Monocytes Relative: 1 %
Neutro Abs: 14.1 K/uL — ABNORMAL HIGH (ref 1.7–7.7)
Neutro Abs: 14.5 K/uL — ABNORMAL HIGH (ref 1.7–7.7)
Neutrophils Relative %: 85 %
Neutrophils Relative %: 82 %
Platelets: 10 K/uL — CL (ref 150–400)
Platelets: 35 K/uL — ABNORMAL LOW (ref 150–400)
RBC: 3.89 MIL/uL (ref 3.87–5.11)
RBC: 3.76 MIL/uL — ABNORMAL LOW (ref 3.87–5.11)
RDW: 15.5 % (ref 11.5–15.5)
RDW: 15.5 % (ref 11.5–15.5)
Smear Review: NORMAL
Smear Review: NORMAL
WBC: 16.6 K/uL — ABNORMAL HIGH (ref 4.0–10.5)
WBC: 17.8 K/uL — ABNORMAL HIGH (ref 4.0–10.5)
nRBC: 0 % (ref 0.0–0.2)
nRBC: 0.1 % (ref 0.0–0.2)

## 2024-07-06 LAB — BASIC METABOLIC PANEL WITH GFR
Anion gap: 11 (ref 5–15)
BUN: 47 mg/dL — ABNORMAL HIGH (ref 8–23)
CO2: 26 mmol/L (ref 22–32)
Calcium: 7.8 mg/dL — ABNORMAL LOW (ref 8.9–10.3)
Chloride: 102 mmol/L (ref 98–111)
Creatinine, Ser: 1.04 mg/dL — ABNORMAL HIGH (ref 0.44–1.00)
GFR, Estimated: 55 mL/min — ABNORMAL LOW (ref >60)
Glucose, Bld: 203 mg/dL — ABNORMAL HIGH (ref 70–99)
Potassium: 3.3 mmol/L — ABNORMAL LOW (ref 3.5–5.1)
Sodium: 139 mmol/L (ref 135–145)

## 2024-07-06 LAB — GLUCOSE, CAPILLARY
Glucose-Capillary: 102 mg/dL — ABNORMAL HIGH (ref 70–99)
Glucose-Capillary: 122 mg/dL — ABNORMAL HIGH (ref 70–99)
Glucose-Capillary: 133 mg/dL — ABNORMAL HIGH (ref 70–99)
Glucose-Capillary: 165 mg/dL — ABNORMAL HIGH (ref 70–99)
Glucose-Capillary: 200 mg/dL — ABNORMAL HIGH (ref 70–99)
Glucose-Capillary: 205 mg/dL — ABNORMAL HIGH (ref 70–99)
Glucose-Capillary: 21 mg/dL — CL (ref 70–99)
Glucose-Capillary: 68 mg/dL — ABNORMAL LOW (ref 70–99)
Glucose-Capillary: 75 mg/dL (ref 70–99)
Glucose-Capillary: 76 mg/dL (ref 70–99)
Glucose-Capillary: 79 mg/dL (ref 70–99)

## 2024-07-06 LAB — TYPE AND SCREEN
ABO/RH(D): O POS
Antibody Screen: NEGATIVE

## 2024-07-06 LAB — PHOSPHORUS: Phosphorus: 5 mg/dL — ABNORMAL HIGH (ref 2.5–4.6)

## 2024-07-06 LAB — MAGNESIUM: Magnesium: 2.3 mg/dL (ref 1.7–2.4)

## 2024-07-06 LAB — LACTATE DEHYDROGENASE: LDH: 119 U/L (ref 98–192)

## 2024-07-06 MED ORDER — SODIUM CHLORIDE 0.9% IV SOLUTION: Freq: Once | INTRAVENOUS | Status: AC

## 2024-07-06 MED ORDER — SODIUM CHLORIDE 0.9 % IV SOLN: INTRAVENOUS | Status: AC | PRN

## 2024-07-06 MED ORDER — FENTANYL CITRATE (PF) 50 MCG/ML IJ SOSY
12.5000 ug | PREFILLED_SYRINGE | INTRAMUSCULAR | Status: DC | PRN
  Administered 2024-07-06 – 2024-07-17 (×26): 12.5 ug via INTRAVENOUS
  Filled 2024-07-06 (×25): qty 1

## 2024-07-06 MED ORDER — POTASSIUM CHLORIDE 10 MEQ/100ML IV SOLN
10.0000 meq | INTRAVENOUS | Status: AC
  Administered 2024-07-06 (×6): 10 meq via INTRAVENOUS
  Filled 2024-07-06 (×6): qty 100

## 2024-07-06 MED ORDER — SODIUM CHLORIDE 0.9 % IV SOLN
1.0000 g | Freq: Three times a day (TID) | INTRAVENOUS | Status: AC
  Administered 2024-07-06 – 2024-07-18 (×35): 1 g via INTRAVENOUS
  Filled 2024-07-06 (×37): qty 20

## 2024-07-06 MED ORDER — DEXTROSE 50 % IV SOLN
12.5000 g | INTRAVENOUS | Status: AC
  Administered 2024-07-06: 12.5 g via INTRAVENOUS
  Filled 2024-07-06: qty 50

## 2024-07-06 MED ORDER — FENTANYL CITRATE (PF) 50 MCG/ML IJ SOSY
PREFILLED_SYRINGE | INTRAMUSCULAR | Status: AC
  Filled 2024-07-06: qty 1
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
## 2024-07-06 NOTE — Progress Notes (Addendum)
 Daily Progress Note   Patient Name: [name]       Date: 07/06/2024 DOB: [birth year]  Age: 78 y.o. MRN#: [id number] Attending Physician: Malka Domino, MD Primary Care Physician: Lorel Maxie LABOR, MD Admit Date: 07/03/2024  Reason for Consultation/Follow-up: Establishing goals of care  Subjective: Notes and labs reviewed.  In to see patient.  She is currently in bed with nursing at bedside bathing patient and doing wound assessment and dressing changes. Pictures are uploaded to media tab. She moans and says ow to some movement.  Patient has as needed fentanyl  ordered.  Symptom management as per CCM.  Called and spoke with patient's daughter.  She discusses that she has received updates today from CCM.  She discusses wanting to come to bedside to see her mother's wounds.  She states she will be here tomorrow morning at 11:30.  Epic chat sent to  CCM MD and NP, and RN. Case discussed with CCM NP in detail.     Length of Stay: 3  Current Medications: Scheduled Meds:   Chlorhexidine  Gluconate Cloth  6 each Topical Daily   Gerhardt's butt cream   Topical BID   hydrocortisone sod succinate (SOLU-CORTEF) inj  100 mg Intravenous Q8H   insulin  aspart  0-9 Units Subcutaneous Q4H   mupirocin ointment  1 Application Nasal BID   mouth rinse  15 mL Mouth Rinse 4 times per day   pantoprazole  (PROTONIX ) IV  40 mg Intravenous Q24H    Continuous Infusions:  meropenem (MERREM) IV 1 g (07/06/24 1150)   norepinephrine (LEVOPHED) Adult infusion 3 mcg/min (07/06/24 1215)   potassium chloride  10 mEq (07/06/24 1215)    PRN Meds: docusate sodium , fentaNYL  (SUBLIMAZE ) injection, ipratropium-albuterol , mouth rinse, polyethylene glycol  Physical Exam Pulmonary:     Effort: Pulmonary effort  is normal.  Skin:    Comments: Bruising and blisters noted on lower extremities.  Skin discoloration noted on upper extremities.  Skin tear noted under left breast.  Neurological:     Mental Status: She is alert.             Vital Signs: BP (!) 84/55   Pulse 62   Temp 98.4 F (36.9 C) (Axillary)   Resp (!) 22   Ht 5' 6 (1.676 m)   Wt 115.5 kg   SpO2 98%  BMI 41.10 kg/m  SpO2: SpO2: 98 % O2 Device: O2 Device: Nasal Cannula O2 Flow Rate: O2 Flow Rate (L/min): 2 L/min  Intake/output summary:  Intake/Output Summary (Last 24 hours) at 07/06/2024 1220 Last data filed at 07/06/2024 1153 Gross per 24 hour  Intake 958.92 ml  Output 1275 ml  Net -316.08 ml   LBM: Last BM Date : 07/05/24 Baseline Weight: Weight: 109.6 kg Most recent weight: Weight: 115.5 kg    Patient Active Problem List   Diagnosis Date Noted   Sepsis (HCC) 07/03/2024   Lower urinary tract infectious disease 07/03/2024   Hypokalemia 04/24/2023   Acute postoperative anemia due to expected blood loss 04/22/2023   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   Hypernatremia 04/20/2023   Acute metabolic encephalopathy 04/20/2023   Hypoglycemia 04/19/2023   Aspiration pneumonia of right lower lobe due to vomit (HCC) 04/19/2023   SBO (small bowel obstruction) (HCC) 04/18/2023   Coffee ground emesis 04/18/2023   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/18/2023   Peripheral neuropathy 04/18/2023   Acute DVT of right tibial vein (HCC) 03/30/2023   Facial weakness 03/30/2023   DVT (deep venous thrombosis) (HCC) 03/30/2023   Mouth droop due to facial weakness 03/29/2023   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) 03/29/2023   Cellulitis of lower extremity 09/10/2022   Near syncope 08/22/2022   Constipation 08/22/2022   Stage 3a chronic kidney disease (HCC) 06/13/2022   Ulcers of both lower extremities, with unspecified severity (HCC) 03/03/2022   Obesity (BMI 30-39.9)  03/03/2022   Anemia of chronic disease 03/03/2022   PAD (peripheral artery disease) 05/30/2020   Malignant neoplasm of right female breast (HCC) 05/13/2018   Venous insufficiency of both lower extremities 11/20/2017   Chronic diastolic CHF (congestive heart failure) (HCC) 11/20/2017   Weight gain 11/20/2017   Lymphedema 04/19/2017   Benign essential HTN 04/19/2017   OSA (obstructive sleep apnea) 04/19/2017   Morbid obesity (HCC) 04/19/2017   TIA (transient ischemic attack) 03/28/2017   Urinary incontinence 06/27/2016   Breast cancer (HCC) 09/08/2004    Palliative Care Assessment & Plan    Recommendations/Plan:  Would consider adding PRN fentanyl  dose for bathing and dressing changes, or scheduling bathing and dressing changes around dose administration.  Daughter plans to come to bedside tomorrow morning at 11:30 AM would like to see her mother's wounds.  CCM team made aware.  PMT will follow.  Family does not want patient to return to [facility].  Code Status:    Code Status Orders  (From admission, onward)           Start     Ordered   07/03/24 1339  Full code  Continuous       Question:  By:  Answer:  Default: patient does not have capacity for decision making, no surrogate or prior directive available   07/03/24 1339           Code Status History     Date Active Date Inactive Code Status Order ID Comments User Context   04/18/2023 0322 04/28/2023 1907 Full Code [id number]  Lawence Madison LABOR, MD ED   03/29/2023 1955 03/31/2023 2043 Full Code [id number]  Tobie Mario GAILS, MD ED   09/10/2022 1318 09/16/2022 1911 Full Code [id number]  Celinda Alm Lot, MD ED   08/22/2022 1541 08/23/2022 2011 Full Code [id number]  Cox, Amy N, DO ED   06/13/2022 0404 06/15/2022 1820 Full Code [id number]  Cleatus Delayne GAILS, MD  ED   03/03/2022 0637 03/07/2022 1523 Full Code [id number]  Debby Camila LABOR, MD ED   03/28/2017 0418 03/28/2017 2122 Full Code [id number]  Lonzo Pollen, MD Inpatient       Camelia Lewis, NP  Please contact Palliative Medicine Team phone at [phone] for questions and concerns.
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
## 2024-07-06 NOTE — Plan of Care (Signed)
  Problem: Tissue Perfusion: Goal: Adequacy of tissue perfusion will improve Outcome: Not Progressing   Problem: Education: Goal: Knowledge of General Education information will improve Description: Including pain rating scale, medication(s)/side effects and non-pharmacologic comfort measures Outcome: Not Progressing   Problem: Clinical Measurements: Goal: Diagnostic test results will improve Outcome: Not Progressing
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
## 2024-07-06 NOTE — Progress Notes (Signed)
 Discussed with her Daughter [name] who is her Health care proxy about blood transfusion product refusal. She is not aware of that and she stated that if this will benefit her than she is in agreement to proceed with platelet transfusion.   Darrin Barn, MD [facility] 07/06/2024 11:14 AM

## 2024-07-06 NOTE — Consult Note (Signed)
 PHARMACY CONSULT NOTE - ELECTROLYTES  Pharmacy Consult for Electrolyte Monitoring and Replacement   Recent Labs: Height: 5' 6 (167.6 cm) Weight: 112.5 kg (248 lb 0.3 oz) IBW/kg (Calculated) : 59.3 Estimated Creatinine Clearance: 56.7 mL/min (A) (by C-G formula based on SCr of 1.04 mg/dL (H)). Potassium (mmol/L)  Date Value  07/06/2024 3.3 (L)  03/22/2014 3.4 (L)   Magnesium (mg/dL)  Date Value  89/70/7974 2.3  03/22/2014 1.9   Calcium  (mg/dL)  Date Value  89/70/7974 7.8 (L)   Calcium , Total (mg/dL)  Date Value  92/84/7984 8.2 (L)   Albumin (g/dL)  Date Value  89/73/7974 1.8 (L)  08/22/2012 3.6   Phosphorus (mg/dL)  Date Value  89/70/7974 5.0 (H)   Sodium (mmol/L)  Date Value  07/06/2024 139  10/01/2023 144  03/22/2014 143   Assessment  [name] is a 78 y.o. female presenting with sepsis. PMH significant for CHF, CKD, PVD, HTN, and breast cancer. Pharmacy has been consulted to monitor and replace electrolytes.  Diet: n/a  MIVF: n/a  Pertinent medications: n/a  Goal of Therapy: Electrolytes WNL  Plan:  Kcl 10mEq IV x6 ordered by provider this AM Check BMP, Mg, Phos with AM labs  Thank you for allowing pharmacy to be a part of this patient's care.  Bernardino George, PharmD Candidate [phone] [facility] [employer] 07/06/2024 7:26 AM
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
## 2024-07-07 DIAGNOSIS — Z7189 Other specified counseling: Secondary | ICD-10-CM | POA: Diagnosis not present

## 2024-07-07 LAB — CBC WITH DIFFERENTIAL/PLATELET
Abs Immature Granulocytes: 1.61 K/uL — ABNORMAL HIGH (ref 0.00–0.07)
Basophils Absolute: 0 K/uL (ref 0.0–0.1)
Basophils Relative: 0 %
Eosinophils Absolute: 0 K/uL (ref 0.0–0.5)
Eosinophils Relative: 0 %
HCT: 34.4 % — ABNORMAL LOW (ref 36.0–46.0)
Hemoglobin: 11.4 g/dL — ABNORMAL LOW (ref 12.0–15.0)
Immature Granulocytes: 9 %
Lymphocytes Relative: 1 %
Lymphs Abs: 0.2 K/uL — ABNORMAL LOW (ref 0.7–4.0)
MCH: 28 pg (ref 26.0–34.0)
MCHC: 33.1 g/dL (ref 30.0–36.0)
MCV: 84.5 fL (ref 80.0–100.0)
Monocytes Absolute: 0.2 K/uL (ref 0.1–1.0)
Monocytes Relative: 1 %
Neutro Abs: 15.2 K/uL — ABNORMAL HIGH (ref 1.7–7.7)
Neutrophils Relative %: 89 %
Platelets: 16 K/uL — CL (ref 150–400)
RBC: 4.07 MIL/uL (ref 3.87–5.11)
RDW: 15.5 % (ref 11.5–15.5)
Smear Review: NORMAL
WBC: 17.2 K/uL — ABNORMAL HIGH (ref 4.0–10.5)
nRBC: 0.2 % (ref 0.0–0.2)

## 2024-07-07 LAB — BASIC METABOLIC PANEL WITH GFR
Anion gap: 14 (ref 5–15)
BUN: 45 mg/dL — ABNORMAL HIGH (ref 8–23)
CO2: 26 mmol/L (ref 22–32)
Calcium: 8.2 mg/dL — ABNORMAL LOW (ref 8.9–10.3)
Chloride: 103 mmol/L (ref 98–111)
Creatinine, Ser: 0.8 mg/dL (ref 0.44–1.00)
GFR, Estimated: >60 mL/min (ref >60)
Glucose, Bld: 85 mg/dL (ref 70–99)
Potassium: 3.8 mmol/L (ref 3.5–5.1)
Sodium: 143 mmol/L (ref 135–145)

## 2024-07-07 LAB — BPAM PLATELET PHERESIS
Blood Product Expiration Date: 202511012359
ISSUE DATE / TIME: 202510291232
Unit Type and Rh: 5100

## 2024-07-07 LAB — GLUCOSE, CAPILLARY
Glucose-Capillary: 84 mg/dL (ref 70–99)
Glucose-Capillary: 65 mg/dL — ABNORMAL LOW (ref 70–99)
Glucose-Capillary: 65 mg/dL — ABNORMAL LOW (ref 70–99)
Glucose-Capillary: 84 mg/dL (ref 70–99)
Glucose-Capillary: 54 mg/dL — ABNORMAL LOW (ref 70–99)
Glucose-Capillary: 158 mg/dL — ABNORMAL HIGH (ref 70–99)
Glucose-Capillary: 46 mg/dL — ABNORMAL LOW (ref 70–99)
Glucose-Capillary: 125 mg/dL — ABNORMAL HIGH (ref 70–99)
Glucose-Capillary: 82 mg/dL (ref 70–99)
Glucose-Capillary: 113 mg/dL — ABNORMAL HIGH (ref 70–99)
Glucose-Capillary: 16 mg/dL — CL (ref 70–99)

## 2024-07-07 LAB — CBC
HCT: 33.2 % — ABNORMAL LOW (ref 36.0–46.0)
Hemoglobin: 11.1 g/dL — ABNORMAL LOW (ref 12.0–15.0)
MCH: 28.7 pg (ref 26.0–34.0)
MCHC: 33.4 g/dL (ref 30.0–36.0)
MCV: 85.8 fL (ref 80.0–100.0)
Platelets: 16 K/uL — CL (ref 150–400)
RBC: 3.87 MIL/uL (ref 3.87–5.11)
RDW: 15.7 % — ABNORMAL HIGH (ref 11.5–15.5)
WBC: 17.7 K/uL — ABNORMAL HIGH (ref 4.0–10.5)
nRBC: 0.1 % (ref 0.0–0.2)

## 2024-07-07 LAB — MAGNESIUM
Magnesium: 2.2 mg/dL (ref 1.7–2.4)
Magnesium: 2.4 mg/dL (ref 1.7–2.4)

## 2024-07-07 LAB — PREPARE PLATELET PHERESIS: Unit division: 0

## 2024-07-07 LAB — POTASSIUM: Potassium: 4 mmol/L (ref 3.5–5.1)

## 2024-07-07 LAB — HEPARIN INDUCED PLATELET AB (HIT ANTIBODY): Heparin Induced Plt Ab: 0.049 {OD_unit} (ref 0.000–0.400)

## 2024-07-07 LAB — HAPTOGLOBIN: Haptoglobin: 320 mg/dL (ref 42–346)

## 2024-07-07 LAB — PHOSPHORUS
Phosphorus: 4.4 mg/dL (ref 2.5–4.6)
Phosphorus: 4.4 mg/dL (ref 2.5–4.6)

## 2024-07-07 MED ORDER — DEXTROSE 50 % IV SOLN
1.0000 | INTRAVENOUS | Status: DC | PRN
  Administered 2024-07-10: 50 mL via INTRAVENOUS
  Filled 2024-07-07: qty 50

## 2024-07-07 MED ORDER — DEXTROSE 50 % IV SOLN
12.5000 g | INTRAVENOUS | Status: AC
  Administered 2024-07-07: 12.5 g via INTRAVENOUS

## 2024-07-07 MED ORDER — DEXTROSE 50 % IV SOLN
INTRAVENOUS | Status: AC
  Administered 2024-07-07: 50 mL via INTRAVENOUS
  Filled 2024-07-07: qty 50

## 2024-07-07 MED ORDER — DEXTROSE 50 % IV SOLN
INTRAVENOUS | Status: AC
  Filled 2024-07-07: qty 50
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
## 2024-07-07 NOTE — Consult Note (Signed)
 PHARMACY CONSULT NOTE - ELECTROLYTES  Pharmacy Consult for Electrolyte Monitoring and Replacement   Recent Labs: Height: 5' 6 (167.6 cm) Weight: 108.4 kg (238 lb 15.7 oz) IBW/kg (Calculated) : 59.3 Estimated Creatinine Clearance: 72.2 mL/min (by C-G formula based on SCr of 0.8 mg/dL). Potassium (mmol/L)  Date Value  07/07/2024 3.8  03/22/2014 3.4 (L)   Magnesium (mg/dL)  Date Value  89/69/7974 2.2  03/22/2014 1.9   Calcium  (mg/dL)  Date Value  89/69/7974 8.2 (L)   Calcium , Total (mg/dL)  Date Value  92/84/7984 8.2 (L)   Albumin (g/dL)  Date Value  89/73/7974 1.8 (L)  08/22/2012 3.6   Phosphorus (mg/dL)  Date Value  89/69/7974 4.4   Sodium (mmol/L)  Date Value  07/07/2024 143  10/01/2023 144  03/22/2014 143   Assessment  [name] is a 78 y.o. female presenting with sepsis. PMH significant for CHF, CKD, PVD, HTN, and breast cancer. Pharmacy has been consulted to monitor and replace electrolytes.  Diet: NPO  MIVF: n/a  Pertinent medications: n/a  Goal of Therapy: Electrolytes WNL  Plan:  No electrolyte replacement indicated at this time  Check BMP, Mg, Phos with AM labs  Thank you for allowing pharmacy to be a part of this patient's care.  Bernardino George, PharmD Candidate [phone] [employer] 07/07/2024 8:01 AM
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
## 2024-07-07 NOTE — Progress Notes (Signed)
 Wound care provided per orders. New images placed in chart. Family requested to take personal photos, assisted family.   Family updated at bedside and via phone.

## 2024-07-08 ENCOUNTER — Other Ambulatory Visit: Payer: Self-pay

## 2024-07-08 DIAGNOSIS — N390 Urinary tract infection, site not specified: Secondary | ICD-10-CM | POA: Diagnosis not present

## 2024-07-08 DIAGNOSIS — I872 Venous insufficiency (chronic) (peripheral): Secondary | ICD-10-CM | POA: Diagnosis not present

## 2024-07-08 DIAGNOSIS — A419 Sepsis, unspecified organism: Secondary | ICD-10-CM

## 2024-07-08 DIAGNOSIS — Z515 Encounter for palliative care: Secondary | ICD-10-CM | POA: Diagnosis not present

## 2024-07-08 DIAGNOSIS — R7881 Bacteremia: Secondary | ICD-10-CM | POA: Diagnosis not present

## 2024-07-08 LAB — BASIC METABOLIC PANEL WITH GFR
Anion gap: 13 (ref 5–15)
BUN: 47 mg/dL — ABNORMAL HIGH (ref 8–23)
CO2: 27 mmol/L (ref 22–32)
Calcium: 8.3 mg/dL — ABNORMAL LOW (ref 8.9–10.3)
Chloride: 105 mmol/L (ref 98–111)
Creatinine, Ser: 0.76 mg/dL (ref 0.44–1.00)
GFR, Estimated: >60 mL/min (ref >60)
Glucose, Bld: 128 mg/dL — ABNORMAL HIGH (ref 70–99)
Potassium: 3.7 mmol/L (ref 3.5–5.1)
Sodium: 145 mmol/L (ref 135–145)

## 2024-07-08 LAB — CBC
HCT: 34.7 % — ABNORMAL LOW (ref 36.0–46.0)
Hemoglobin: 11.5 g/dL — ABNORMAL LOW (ref 12.0–15.0)
MCH: 28.5 pg (ref 26.0–34.0)
MCHC: 33.1 g/dL (ref 30.0–36.0)
MCV: 86.1 fL (ref 80.0–100.0)
Platelets: 22 K/uL — CL (ref 150–400)
RBC: 4.03 MIL/uL (ref 3.87–5.11)
RDW: 15.9 % — ABNORMAL HIGH (ref 11.5–15.5)
WBC: 14.6 K/uL — ABNORMAL HIGH (ref 4.0–10.5)
nRBC: 0.4 % — ABNORMAL HIGH (ref 0.0–0.2)

## 2024-07-08 LAB — GLUCOSE, CAPILLARY
Glucose-Capillary: 115 mg/dL — ABNORMAL HIGH (ref 70–99)
Glucose-Capillary: 125 mg/dL — ABNORMAL HIGH (ref 70–99)
Glucose-Capillary: 105 mg/dL — ABNORMAL HIGH (ref 70–99)
Glucose-Capillary: 135 mg/dL — ABNORMAL HIGH (ref 70–99)

## 2024-07-08 LAB — MAGNESIUM: Magnesium: 2.2 mg/dL (ref 1.7–2.4)

## 2024-07-08 LAB — PHOSPHORUS: Phosphorus: 4.7 mg/dL — ABNORMAL HIGH (ref 2.5–4.6)

## 2024-07-08 MED ORDER — INFLUENZA VAC SPLIT HIGH-DOSE 0.5 ML IM SUSY: 0.5000 mL | PREFILLED_SYRINGE | INTRAMUSCULAR | Status: DC | PRN

## 2024-07-08 MED ORDER — VITAMIN C 500 MG PO TABS
500.0000 mg | ORAL_TABLET | Freq: Two times a day (BID) | ORAL | Status: DC
  Administered 2024-07-10 – 2024-07-20 (×21): 500 mg
  Filled 2024-07-08 (×21): qty 1

## 2024-07-08 MED ORDER — ORAL CARE MOUTH RINSE: 15.0000 mL | OROMUCOSAL | Status: DC | PRN

## 2024-07-08 MED ORDER — THIAMINE HCL 100 MG PO TABS
100.0000 mg | ORAL_TABLET | Freq: Every day | ORAL | Status: AC
  Administered 2024-07-11 – 2024-07-15 (×5): 100 mg
  Filled 2024-07-08 (×12): qty 1

## 2024-07-08 MED ORDER — PROSOURCE TF20 ENFIT COMPATIBL EN LIQD
60.0000 mL | Freq: Every day | ENTERAL | Status: DC
  Administered 2024-07-11 – 2024-07-20 (×10): 60 mL
  Filled 2024-07-08 (×5): qty 60

## 2024-07-08 MED ORDER — FREE WATER
100.0000 mL | Status: DC
  Administered 2024-07-10 – 2024-07-11 (×5): 100 mL

## 2024-07-08 MED ORDER — ORAL CARE MOUTH RINSE: 15.0000 mL | OROMUCOSAL | Status: DC

## 2024-07-08 MED ORDER — MORPHINE SULFATE (PF) 2 MG/ML IV SOLN
1.0000 mg | INTRAVENOUS | Status: DC | PRN
  Administered 2024-07-08 – 2024-07-12 (×9): 1 mg via INTRAVENOUS
  Filled 2024-07-08 (×9): qty 1

## 2024-07-08 MED ORDER — SODIUM CHLORIDE 0.9 % IV SOLN: INTRAVENOUS | Status: AC | PRN

## 2024-07-08 MED ORDER — OSMOLITE 1.5 CAL PO LIQD
1000.0000 mL | ORAL | Status: DC
  Administered 2024-07-10 – 2024-07-19 (×12): 1000 mL
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
## 2024-07-08 NOTE — Plan of Care (Signed)
   Problem: Metabolic: Goal: Ability to maintain appropriate glucose levels will improve Outcome: Progressing
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
## 2024-07-08 NOTE — Consult Note (Signed)
 PHARMACY CONSULT NOTE - ELECTROLYTES  Pharmacy Consult for Electrolyte Monitoring and Replacement   Recent Labs: Height: 5' 6 (167.6 cm) Weight: 108.4 kg (238 lb 15.7 oz) IBW/kg (Calculated) : 59.3 Estimated Creatinine Clearance: 72.2 mL/min (by C-G formula based on SCr of 0.76 mg/dL). Potassium (mmol/L)  Date Value  07/08/2024 3.7  03/22/2014 3.4 (L)   Magnesium (mg/dL)  Date Value  89/68/7974 2.2  03/22/2014 1.9   Calcium  (mg/dL)  Date Value  89/68/7974 8.3 (L)   Calcium , Total (mg/dL)  Date Value  92/84/7984 8.2 (L)   Albumin (g/dL)  Date Value  89/73/7974 1.8 (L)  08/22/2012 3.6   Phosphorus (mg/dL)  Date Value  89/68/7974 4.7 (H)   Sodium (mmol/L)  Date Value  07/08/2024 145  10/01/2023 144  03/22/2014 143   Assessment  [name] is a 78 y.o. female presenting with sepsis. PMH significant for CHF, CKD, PVD, HTN, and breast cancer. Pharmacy has been consulted to monitor and replace electrolytes.  Diet: NPO  MIVF: n/a  Pertinent medications: n/a  Goal of Therapy: Electrolytes WNL  Plan:  No electrolyte replacement indicated at this time  Check BMP, Mg, Phos with AM labs  Thank you for allowing pharmacy to be a part of this patient's care.  Bernardino George, PharmD Candidate [phone] [facility] 07/08/2024 7:11 AM

## 2024-07-08 NOTE — Progress Notes (Addendum)
 SLP Cancellation Note  Patient Details Name: [name] MRN: [id number] DOB: [birth year]   Cancelled treatment:       Reason Eval/Treat Not Completed: Patient's level of consciousness;Patient not medically ready (chart reviewed; consulted NSG re: pt's status today)  Per chart notes, Palliative Care is following for GOC d/t pt's ongoing Comorbidities and is addressing discussion of alternative means of feeding(NGT trial) if appropriate.  Pt is off pressors and has verbalized NO intermittently but remains Encephalopathic, lethargic and does not engage in meaningful conversation per report.  Pt is loudly moaning/groaning this morning(10/31) during NSG care. It did not appear that she was coherent nor following any commands.   W/ pt's presentation now (and in recent days) and w/ Refusal of PO last visit (NP present to witness Refusal), pt is at HIGH RISK for aspiration of oral intake. ST services recommends HOLDING on PO intake/trials for the next week when pt's Mental Status indicates appropriate responsiveness to safely participate in oral intake. NSG agreed.   Recommend continue w/ NPO status w/ Pleasure ice chips when appropriate per NSG monitoring; oral care for hygiene prior to ice chips and for therapeutic swallowing.  NSG/MD agreed.       Comer Portugal, MS, CCC-SLP Speech Language Pathologist Rehab Services; [facility] [phone] (ascom) Analynn Daum 07/08/2024, 11:44 AM

## 2024-07-08 NOTE — Consult Note (Addendum)
 WOC Nurse Consult Note: Reason for Consult: Reassess multiple injuries. Pressure Injury POA: Yes Addendum: pt has venous thrombosis, venous insufficiency, Stage 3 Chronic kidney, hypokalemia, albumin 20mg /dL, sepsis that can interfere with the healing process and developing new wounds.  1. Sacrum Wound type: DTI on sacrum Measurement: open area 2 cm x 2 cm x 0.1 cm. Wound bed: 100% red Drainage (amount, consistency, odor) Scant amount, serous, no odor. Periwound: Maroon discoloration (DTI injury) 1. Dressing procedure/placement/frequency: Cleanse with saline, pat dry. Apply Xeroform daily to the wound bed, top with foam dressing. Ok to lift and change the Xeroform, the foam can stay up to 3 days if not saturated or soiling.  2. Right hip Wound type: Right hip due friction Measurement: partial thickness 8 cm x 1 cm x 0.1 cm. Wound bed: 100% pale red. Drainage (amount, consistency, odor) Scant amount, serous, no odor. Periwound: intact 2. Dressing procedure/placement/frequency: Cleanse with saline, pat dry. Apply Xeroform daily to the wound bed, top with foam dressing. Ok to lift and change the Xeroform, the foam can stay up to 3 days if not saturated or soiling.  3. Right leg  Wound type: Right ankle full thickness (non-pressure ulcer).  Addendum: Wound care clinic classification 10/07. Measurement: 2 open areas around anterior malleolus preeminence aprox. 2 cm x 2 cm. Wound bed: 60% yellow, 40% red. Drainage (amount, consistency, odor) Minimum amount, serous, no odor. Periwound: intact Wound type: Right leg multiple wound due lymphedema/venous hypertension. Addendum: Wound care clinic classification 10/07. Wound bed: 100% pale red. Drainage (amount, consistency, odor) Moderate amount, serous, no odor. Periwound: bruises, maceration. 3. Dressing procedure/placement/frequency: Cleanse with Vashe [id number], not rinse. Apply Xeroform daily to the wound bed, top with foam dressing or  cover with ABD pad and secure with Kerlix roll gauze beginning right above toes and ending below knees. Apply Ace bandage wrapped in same fashion as Kerlix for light compression.    4. Left Leg Wound type: Left heel (non-pressure ulcer).  Addendum: Wound care clinic classification 10/07. Wound bed: 80% yellow/brown slough. 20% red Drainage (amount, consistency, odor) Moderate amount, serous, no odor. Periwound: maceration. Wound type: Left foot (Bulla) etiology unclear.  Wound bed: Bulla intact, clear fluid. Drainage (amount, consistency, odor) None Periwound: intact Wound type: Left leg full thickness - etiology unclear.  Wound bed: 100% red, partial cover with peeling skin. Drainage (amount, consistency, odor) Minimum amount, serous, no odor. Periwound: bruises, peeling skin. 4. Dressing procedure/placement/frequency: Apply Aquacel #[id number] to the left hell daily. Top with foam dressing. Left foot, leave the bulla intact. If ruptured, apply Xeroform, top with foam dressing, change daily. Cleanse wounds lower legs with Vashe, apply Xeroform gauze ([place] [phone]) to wound beds daily, cover with ABD pad and secure with Kerlix roll gauze beginning right above toes and ending below knees. Apply Ace bandage wrapped in same fashion as Kerlix for light compression.    5. Left Thigh Wound type: partial thickness - etiology unclear. Bulla on posterior area of the knee. Measurement: aprox. 25 cm x 15 cm x 0.1 cm. Wound bed: 100% red. Drainage (amount, consistency, odor) Minimum amount, serous, no odor. Periwound: bruises, flap skin over the burning area. 5. Dressing procedure/placement/frequency: Cleanse with Vashe [id number], not rinse. Apply Xeroform to the wound bed, cover with foam dressing, change daily or PRN.  Last 5 days using Interdry in groins/pannus. Assessed in person, the wounds are partial healed, shallow, no signs of inflammatory process.  Cleanse with warm water and wash cloth, pat  dry.  Apply Gerhardt butt cream daily. Remove all cream in each cleaning.  WOC team will not plan to follow further. Please reconsult if further assistance is needed. Thank-you,  Lela Holm RN, CNS, [employer], MSN.  (Phone [phone])
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
## 2024-07-08 NOTE — Plan of Care (Signed)
 This patient remains on AR-SDU as of time of writing. The patient is disoriented x 4; not consistently following commands. The patient is bed-bound and requires an over-head lift for turns. The patient is NPO and has no enteral access at the moment. Per nursing handoff, the patient was unable to tolerate attempts at [facility] placement earlier today. The patient still has CVC, Arterial line, and foley catheter. Diffuse wounds (see flowsheet). The patient remains Full Code for now.   Problem: Education: Goal: Ability to describe self-care measures that may prevent or decrease complications (Diabetes Survival Skills Education) will improve Outcome: Not Progressing Goal: Individualized Educational Video(s) Outcome: Not Progressing   Problem: Coping: Goal: Ability to adjust to condition or change in health will improve Outcome: Not Progressing   Problem: Fluid Volume: Goal: Ability to maintain a balanced intake and output will improve Outcome: Not Progressing   Problem: Health Behavior/Discharge Planning: Goal: Ability to identify and utilize available resources and services will improve Outcome: Not Progressing Goal: Ability to manage health-related needs will improve Outcome: Not Progressing   Problem: Metabolic: Goal: Ability to maintain appropriate glucose levels will improve Outcome: Not Progressing   Problem: Nutritional: Goal: Maintenance of adequate nutrition will improve Outcome: Not Progressing Goal: Progress toward achieving an optimal weight will improve Outcome: Not Progressing   Problem: Skin Integrity: Goal: Risk for impaired skin integrity will decrease Outcome: Not Progressing   Problem: Tissue Perfusion: Goal: Adequacy of tissue perfusion will improve Outcome: Not Progressing   Problem: Education: Goal: Knowledge of General Education information will improve Description: Including pain rating scale, medication(s)/side effects and non-pharmacologic comfort  measures Outcome: Not Progressing   Problem: Health Behavior/Discharge Planning: Goal: Ability to manage health-related needs will improve Outcome: Not Progressing   Problem: Clinical Measurements: Goal: Ability to maintain clinical measurements within normal limits will improve Outcome: Not Progressing Goal: Will remain free from infection Outcome: Not Progressing Goal: Diagnostic test results will improve Outcome: Not Progressing Goal: Respiratory complications will improve Outcome: Not Progressing Goal: Cardiovascular complication will be avoided Outcome: Not Progressing   Problem: Activity: Goal: Risk for activity intolerance will decrease Outcome: Not Progressing   Problem: Nutrition: Goal: Adequate nutrition will be maintained Outcome: Not Progressing   Problem: Coping: Goal: Level of anxiety will decrease Outcome: Not Progressing   Problem: Elimination: Goal: Will not experience complications related to bowel motility Outcome: Not Progressing Goal: Will not experience complications related to urinary retention Outcome: Not Progressing   Problem: Pain Managment: Goal: General experience of comfort will improve and/or be controlled Outcome: Not Progressing   Problem: Safety: Goal: Ability to remain free from injury will improve Outcome: Not Progressing   Problem: Skin Integrity: Goal: Risk for impaired skin integrity will decrease Outcome: Not Progressing   Problem: Clinical Measurements: Goal: Ability to avoid or minimize complications of infection will improve Outcome: Not Progressing   Problem: Skin Integrity: Goal: Skin integrity will improve Outcome: Not Progressing   Problem: Education: Goal: Ability to demonstrate management of disease process will improve Outcome: Not Progressing Goal: Ability to verbalize understanding of medication therapies will improve Outcome: Not Progressing Goal: Individualized Educational Video(s) Outcome: Not  Progressing   Problem: Activity: Goal: Capacity to carry out activities will improve Outcome: Not Progressing   Problem: Cardiac: Goal: Ability to achieve and maintain adequate cardiopulmonary perfusion will improve Outcome: Not Progressing

## 2024-07-08 NOTE — Inpatient Diabetes Management (Signed)
 Inpatient Diabetes Program Recommendations  AACE/ADA: New Consensus Statement on Inpatient Glycemic Control   Target Ranges:  Prepandial:   less than 140 mg/dL      Peak postprandial:   less than 180 mg/dL (1-2 hours)      Critically ill patients:  140 - 180 mg/dL   Lab Results  Component Value Date   GLUCAP 125 (H) 07/08/2024   HGBA1C 7.5 (H) 07/03/2024    Latest Reference Range & Units 07/07/24 11:22 07/07/24 12:01 07/07/24 12:06 07/07/24 15:58 07/07/24 20:02 07/07/24 23:19 07/07/24 23:25 07/08/24 05:45 07/08/24 08:02  Glucose-Capillary 70 - 99 mg/dL 54 (L) 46 (L) 841 (H) 874 (H) 82 16 (LL) 113 (H) 115 (H) 125 (H)  (LL): Data is critically low (L): Data is abnormally low (H): Data is abnormally high  Review of Glycemic Control  Diabetes history: DM2 Outpatient Diabetes medications: Jardiance  10 QAM, Metformin 1000 every day; Decadron  4 mg BID  Current orders for Inpatient glycemic control:  Solucortel 100 mg q 8 hrs. Novolog 0-9 units q 4 hrs. correction  Inpatient Diabetes Program Recommendations:   Patient had hypoglycemia  of 16 last hs. Please consider: -Decrease Novolog correction to 0-6 units q 4 hrs. Or D/C Novolog correction and check CBGs only and address as needed.  Thank you, Quintus Premo E. Tatem Fesler, RN, MSN, CNS, CDCES  Diabetes Coordinator Inpatient Glycemic Control Team Team Pager [phone] (8am-5pm) 07/08/2024 11:28 AM

## 2024-07-09 DIAGNOSIS — N170 Acute kidney failure with tubular necrosis: Secondary | ICD-10-CM

## 2024-07-09 DIAGNOSIS — R6521 Severe sepsis with septic shock: Secondary | ICD-10-CM

## 2024-07-09 DIAGNOSIS — L03116 Cellulitis of left lower limb: Secondary | ICD-10-CM | POA: Diagnosis not present

## 2024-07-09 DIAGNOSIS — A4151 Sepsis due to Escherichia coli [E. coli]: Secondary | ICD-10-CM

## 2024-07-09 DIAGNOSIS — A419 Sepsis, unspecified organism: Secondary | ICD-10-CM | POA: Diagnosis not present

## 2024-07-09 DIAGNOSIS — N390 Urinary tract infection, site not specified: Secondary | ICD-10-CM | POA: Diagnosis not present

## 2024-07-09 DIAGNOSIS — Z515 Encounter for palliative care: Secondary | ICD-10-CM | POA: Diagnosis not present

## 2024-07-09 LAB — GLUCOSE, CAPILLARY
Glucose-Capillary: 120 mg/dL — ABNORMAL HIGH (ref 70–99)
Glucose-Capillary: 128 mg/dL — ABNORMAL HIGH (ref 70–99)
Glucose-Capillary: 132 mg/dL — ABNORMAL HIGH (ref 70–99)
Glucose-Capillary: 134 mg/dL — ABNORMAL HIGH (ref 70–99)
Glucose-Capillary: 137 mg/dL — ABNORMAL HIGH (ref 70–99)
Glucose-Capillary: 148 mg/dL — ABNORMAL HIGH (ref 70–99)

## 2024-07-09 LAB — BASIC METABOLIC PANEL WITH GFR
Anion gap: 17 — ABNORMAL HIGH (ref 5–15)
BUN: 54 mg/dL — ABNORMAL HIGH (ref 8–23)
CO2: 22 mmol/L (ref 22–32)
Calcium: 8.5 mg/dL — ABNORMAL LOW (ref 8.9–10.3)
Chloride: 107 mmol/L (ref 98–111)
Creatinine, Ser: 0.8 mg/dL (ref 0.44–1.00)
GFR, Estimated: >60 mL/min (ref >60)
Glucose, Bld: 145 mg/dL — ABNORMAL HIGH (ref 70–99)
Potassium: 3.9 mmol/L (ref 3.5–5.1)
Sodium: 146 mmol/L — ABNORMAL HIGH (ref 135–145)

## 2024-07-09 LAB — CBC
HCT: 35.8 % — ABNORMAL LOW (ref 36.0–46.0)
Hemoglobin: 11.5 g/dL — ABNORMAL LOW (ref 12.0–15.0)
MCH: 28.4 pg (ref 26.0–34.0)
MCHC: 32.1 g/dL (ref 30.0–36.0)
MCV: 88.4 fL (ref 80.0–100.0)
Platelets: 31 K/uL — ABNORMAL LOW (ref 150–400)
RBC: 4.05 MIL/uL (ref 3.87–5.11)
RDW: 15.9 % — ABNORMAL HIGH (ref 11.5–15.5)
WBC: 11.9 K/uL — ABNORMAL HIGH (ref 4.0–10.5)
nRBC: 0.9 % — ABNORMAL HIGH (ref 0.0–0.2)

## 2024-07-09 LAB — MAGNESIUM: Magnesium: 2.4 mg/dL (ref 1.7–2.4)

## 2024-07-09 LAB — PHOSPHORUS: Phosphorus: 5.2 mg/dL — ABNORMAL HIGH (ref 2.5–4.6)

## 2024-07-09 MED ORDER — ORAL CARE MOUTH RINSE: 15.0000 mL | OROMUCOSAL | Status: DC | PRN

## 2024-07-09 MED ORDER — ORAL CARE MOUTH RINSE
15.0000 mL | OROMUCOSAL | Status: DC
  Administered 2024-07-09 – 2024-07-10 (×8): 15 mL via OROMUCOSAL

## 2024-07-09 MED ORDER — ORAL CARE MOUTH RINSE
15.0000 mL | OROMUCOSAL | Status: DC
  Administered 2024-07-10 – 2024-07-20 (×43): 15 mL via OROMUCOSAL

## 2024-07-09 MED ORDER — HYDROCORTISONE SOD SUC (PF) 100 MG IJ SOLR
100.0000 mg | Freq: Two times a day (BID) | INTRAMUSCULAR | Status: DC
  Administered 2024-07-09 – 2024-07-14 (×10): 100 mg via INTRAVENOUS
  Filled 2024-07-09 (×10): qty 2

## 2024-07-09 NOTE — Plan of Care (Signed)
  Problem: Metabolic: Goal: Ability to maintain appropriate glucose levels will improve Outcome: Progressing   Problem: Clinical Measurements: Goal: Respiratory complications will improve Outcome: Progressing

## 2024-07-09 NOTE — Progress Notes (Addendum)
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 NOT DETECTED Final   Influenza A NOT DETECTED NOT DETECTED Final   Influenza B NOT DETECTED NOT DETECTED Final   Parainfluenza Virus 1 NOT DETECTED NOT DETECTED Final   Parainfluenza Virus 2 NOT DETECTED NOT DETECTED Final   Parainfluenza Virus 3 NOT DETECTED NOT DETECTED Final   Parainfluenza Virus 4 NOT DETECTED NOT DETECTED Final    Respiratory Syncytial Virus NOT DETECTED NOT DETECTED Final   Bordetella pertussis NOT DETECTED NOT DETECTED Final   Bordetella Parapertussis NOT DETECTED NOT DETECTED Final   Chlamydophila pneumoniae NOT DETECTED NOT DETECTED Final   Mycoplasma pneumoniae NOT DETECTED NOT DETECTED Final    Comment: Performed at [facility], [address], [place], [place] [address]    Procedures and diagnostic studies:  No results found.             LOS: 6 days   Robb Sibal  [facility]   Pager on [email or web address]. If 7PM-7AM, please contact night-coverage at [email or web address]     07/09/2024, 12:16 PM

## 2024-07-09 NOTE — Consult Note (Signed)
 PHARMACY CONSULT NOTE - ELECTROLYTES  Pharmacy Consult for Electrolyte Monitoring and Replacement   Recent Labs: Height: 5' 6 (167.6 cm) Weight: 100.9 kg (222 lb 7.1 oz) IBW/kg (Calculated) : 59.3 Estimated Creatinine Clearance: 69.4 mL/min (by C-G formula based on SCr of 0.8 mg/dL). Potassium (mmol/L)  Date Value  07/09/2024 3.9  03/22/2014 3.4 (L)   Magnesium (mg/dL)  Date Value  88/98/7974 2.4  03/22/2014 1.9   Calcium  (mg/dL)  Date Value  88/98/7974 8.5 (L)   Calcium , Total (mg/dL)  Date Value  92/84/7984 8.2 (L)   Albumin (g/dL)  Date Value  89/73/7974 1.8 (L)  08/22/2012 3.6   Phosphorus (mg/dL)  Date Value  88/98/7974 5.2 (H)   Sodium (mmol/L)  Date Value  07/09/2024 146 (H)  10/01/2023 144  03/22/2014 143   Assessment  [name] is a 78 y.o. female presenting with sepsis. PMH significant for CHF, CKD, PVD, HTN, and breast cancer. Pharmacy has been consulted to monitor and replace electrolytes.  Diet: prosource 60 ml per tube daily.   MIVF: n/a  Pertinent medications: free water 100 ml q4H.   Goal of Therapy: Electrolytes WNL  Plan:  No replacement needed. Sodium is trending up.  F/u with AM labs.   Thank you for allowing pharmacy to be a part of this patient's care.  Cathaleen Blanch, PharmD, BCPS 07/09/2024 7:12 AM
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
## 2024-07-09 DEATH — deceased

## 2024-07-10 ENCOUNTER — Inpatient Hospital Stay

## 2024-07-10 DIAGNOSIS — N170 Acute kidney failure with tubular necrosis: Secondary | ICD-10-CM | POA: Diagnosis not present

## 2024-07-10 DIAGNOSIS — R6521 Severe sepsis with septic shock: Secondary | ICD-10-CM | POA: Diagnosis not present

## 2024-07-10 DIAGNOSIS — Z7189 Other specified counseling: Secondary | ICD-10-CM | POA: Diagnosis not present

## 2024-07-10 DIAGNOSIS — A4151 Sepsis due to Escherichia coli [E. coli]: Secondary | ICD-10-CM | POA: Diagnosis not present

## 2024-07-10 LAB — CBC
HCT: 34.3 % — ABNORMAL LOW (ref 36.0–46.0)
Hemoglobin: 10.9 g/dL — ABNORMAL LOW (ref 12.0–15.0)
MCH: 28.2 pg (ref 26.0–34.0)
MCHC: 31.8 g/dL (ref 30.0–36.0)
MCV: 88.9 fL (ref 80.0–100.0)
Platelets: 58 K/uL — ABNORMAL LOW (ref 150–400)
RBC: 3.86 MIL/uL — ABNORMAL LOW (ref 3.87–5.11)
RDW: 16.1 % — ABNORMAL HIGH (ref 11.5–15.5)
WBC: 11.3 K/uL — ABNORMAL HIGH (ref 4.0–10.5)
nRBC: 1 % — ABNORMAL HIGH (ref 0.0–0.2)

## 2024-07-10 LAB — BASIC METABOLIC PANEL WITH GFR
Anion gap: 13 (ref 5–15)
BUN: 60 mg/dL — ABNORMAL HIGH (ref 8–23)
CO2: 24 mmol/L (ref 22–32)
Calcium: 8.3 mg/dL — ABNORMAL LOW (ref 8.9–10.3)
Chloride: 109 mmol/L (ref 98–111)
Creatinine, Ser: 0.83 mg/dL (ref 0.44–1.00)
GFR, Estimated: >60 mL/min (ref >60)
Glucose, Bld: 150 mg/dL — ABNORMAL HIGH (ref 70–99)
Potassium: 3.8 mmol/L (ref 3.5–5.1)
Sodium: 146 mmol/L — ABNORMAL HIGH (ref 135–145)

## 2024-07-10 LAB — PHOSPHORUS: Phosphorus: 5.2 mg/dL — ABNORMAL HIGH (ref 2.5–4.6)

## 2024-07-10 LAB — GLUCOSE, CAPILLARY
Glucose-Capillary: 135 mg/dL — ABNORMAL HIGH (ref 70–99)
Glucose-Capillary: 164 mg/dL — ABNORMAL HIGH (ref 70–99)
Glucose-Capillary: 57 mg/dL — ABNORMAL LOW (ref 70–99)
Glucose-Capillary: 58 mg/dL — ABNORMAL LOW (ref 70–99)
Glucose-Capillary: 95 mg/dL (ref 70–99)

## 2024-07-10 LAB — MAGNESIUM: Magnesium: 2.6 mg/dL — ABNORMAL HIGH (ref 1.7–2.4)

## 2024-07-10 MED ORDER — METOPROLOL TARTRATE 25 MG PO TABS
25.0000 mg | ORAL_TABLET | Freq: Two times a day (BID) | ORAL | Status: DC
  Administered 2024-07-10: 25 mg via ORAL
  Filled 2024-07-10: qty 1

## 2024-07-10 MED ORDER — MIDAZOLAM HCL 2 MG/2ML IJ SOLN
INTRAMUSCULAR | Status: AC
  Filled 2024-07-10: qty 4

## 2024-07-10 MED ORDER — METOPROLOL TARTRATE 25 MG PO TABS
25.0000 mg | ORAL_TABLET | Freq: Two times a day (BID) | ORAL | Status: DC
  Administered 2024-07-10 – 2024-07-14 (×7): 25 mg
  Filled 2024-07-10 (×8): qty 1

## 2024-07-10 NOTE — Plan of Care (Signed)
 Continuing with plan of care.

## 2024-07-10 NOTE — Progress Notes (Signed)
 Pt had 6 run of Vtach. RN notified by tele, MD [facility] made aware. Pt presenting no differently in bed. No new orders at this time.

## 2024-07-10 NOTE — Plan of Care (Signed)
  Problem: Education: Goal: Ability to describe self-care measures that may prevent or decrease complications (Diabetes Survival Skills Education) will improve Outcome: Not Progressing Goal: Individualized Educational Video(s) Outcome: Not Progressing   Problem: Coping: Goal: Ability to adjust to condition or change in health will improve Outcome: Not Progressing   Problem: Fluid Volume: Goal: Ability to maintain a balanced intake and output will improve Outcome: Not Progressing   Problem: Health Behavior/Discharge Planning: Goal: Ability to identify and utilize available resources and services will improve Outcome: Not Progressing Goal: Ability to manage health-related needs will improve Outcome: Not Progressing   Problem: Metabolic: Goal: Ability to maintain appropriate glucose levels will improve Outcome: Not Progressing   Problem: Nutritional: Goal: Maintenance of adequate nutrition will improve Outcome: Not Progressing Goal: Progress toward achieving an optimal weight will improve Outcome: Not Progressing   Problem: Skin Integrity: Goal: Risk for impaired skin integrity will decrease Outcome: Not Progressing   Problem: Tissue Perfusion: Goal: Adequacy of tissue perfusion will improve Outcome: Not Progressing   Problem: Education: Goal: Knowledge of General Education information will improve Description: Including pain rating scale, medication(s)/side effects and non-pharmacologic comfort measures Outcome: Not Progressing   Problem: Health Behavior/Discharge Planning: Goal: Ability to manage health-related needs will improve Outcome: Not Progressing   Problem: Clinical Measurements: Goal: Ability to maintain clinical measurements within normal limits will improve Outcome: Not Progressing Goal: Will remain free from infection Outcome: Not Progressing Goal: Diagnostic test results will improve Outcome: Not Progressing Goal: Respiratory complications will  improve Outcome: Not Progressing Goal: Cardiovascular complication will be avoided Outcome: Not Progressing

## 2024-07-10 NOTE — Progress Notes (Addendum)
 Daily Progress Note   Patient Name: [name]       Date: 07/10/2024 DOB: 1945-11-22  Age: 78 y.o. MRN#: [id number] Attending Physician: Jens Durand, MD Primary Care Physician: Lorel Maxie LABOR, MD Admit Date: 07/03/2024  Reason for Consultation/Follow-up: Establishing goals of care  Subjective: Notes and labs reviewed.  Ms. Hafner has DPOA for patient but not MPOA.  Into see patient.  Patient's son Dorn is at bedside. Updates have been provided, and nursing staff are present and speaking with him.  I joined the conversation.  He states he does not want his mother to suffer, and inquires of what his sister stated during our conversation on 10/30.  Discussed the contents of our conversation.  He states he does not want CPR for his mother.    He states in regards to further decisions beyond CPR, this is a lot to take him and needs time.  Discussed trying to determine the time that they can both be here together.  Nursing exited room.  Further discussed her status and questions were answered.  Discussed patient autonomy, and pain and suffering.  Called to speak with daughter while in ICU outside of patient's room.   Discussed conversation with patient's son and updates.  Discussed trying to determine the time that they can both be here together.  Discussed that feeding tube had been placed this morning.  Visitors arrived at bedside, and Ms. Fiumara requested my going to speak with them as one of them is her godmother.  In to speak with family.  They discuss information that they have been given, and state that patient is tired and ready to go.  They state they will continue talking with the patient's 2 children.  Called Ms. Bledsoe back.  Upon reinitiating conversation, Ms. Bungert states  that the visitor who just left is calling and beeping in to speak with her.  PMT and daughter disconnected.  PMT will follow-up tomorrow.  Surrogate decision makers: Dorn states he and his sister Ms. Boda are patient's only children.  He states he is not aware of any adopted children patient has.  Daughter Ms. Sproule states that she and her brother Dorn are patient's only children.  She states patient used to be a foster parent, but never legally adopted any of the children.   Length  of Stay: 7  Current Medications: Scheduled Meds:   vitamin C   500 mg Per Tube BID   Chlorhexidine  Gluconate Cloth  6 each Topical Daily   feeding supplement (PROSource TF20)  60 mL Per Tube Daily   free water  100 mL Per Tube Q4H   Gerhardt's butt cream   Topical BID   hydrocortisone sod succinate (SOLU-CORTEF) inj  100 mg Intravenous Q12H   metoprolol  tartrate  25 mg Oral BID   mouth rinse  15 mL Mouth Rinse 4 times per day   mouth rinse  15 mL Mouth Rinse 4 times per day   pantoprazole  (PROTONIX ) IV  40 mg Intravenous Q24H   thiamine  100 mg Per Tube Daily    Continuous Infusions:  feeding supplement (OSMOLITE 1.5 CAL)     meropenem (MERREM) IV Stopped (07/10/24 0711)    PRN Meds: dextrose , docusate sodium , fentaNYL  (SUBLIMAZE ) injection, Influenza vac split trivalent PF, ipratropium-albuterol , morphine  injection, mouth rinse, mouth rinse, mouth rinse, polyethylene glycol  Physical Exam Constitutional:      Comments: Eyes closed.  Moans intermittently.  Pulmonary:     Effort: Pulmonary effort is normal.  Skin:    General: Skin is warm and dry.             Vital Signs: BP (!) 154/127   Pulse 83   Temp 98.4 F (36.9 C) (Axillary)   Resp 13   Ht 5' 6 (1.676 m)   Wt 112 kg   SpO2 100%   BMI 39.85 kg/m  SpO2: SpO2: 100 % O2 Device: O2 Device: Nasal Cannula O2 Flow Rate: O2 Flow Rate (L/min): 2 L/min  Intake/output summary:  Intake/Output Summary (Last 24 hours) at 07/10/2024  1442 Last data filed at 07/10/2024 1345 Gross per 24 hour  Intake 449.52 ml  Output 260 ml  Net 189.52 ml   LBM: Last BM Date : 07/05/24 Baseline Weight: Weight: 109.6 kg Most recent weight: Weight: 112 kg     Patient Active Problem List   Diagnosis Date Noted   Sepsis (HCC) 07/03/2024   Lower urinary tract infectious disease 07/03/2024   Hypokalemia 04/24/2023   Acute postoperative anemia due to expected blood loss 04/22/2023   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   Hypernatremia 04/20/2023   Acute metabolic encephalopathy 04/20/2023   Hypoglycemia 04/19/2023   Aspiration pneumonia of right lower lobe due to vomit (HCC) 04/19/2023   SBO (small bowel obstruction) (HCC) 04/18/2023   Coffee ground emesis 04/18/2023   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/18/2023   Peripheral neuropathy 04/18/2023   Acute DVT of right tibial vein (HCC) 03/30/2023   Facial weakness 03/30/2023   DVT (deep venous thrombosis) (HCC) 03/30/2023   Mouth droop due to facial weakness 03/29/2023   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) 03/29/2023   Cellulitis of lower extremity 09/10/2022   Near syncope 08/22/2022   Constipation 08/22/2022   Stage 3a chronic kidney disease (HCC) 06/13/2022   Ulcers of both lower extremities, with unspecified severity (HCC) 03/03/2022   Obesity (BMI 30-39.9) 03/03/2022   Anemia of chronic disease 03/03/2022   PAD (peripheral artery disease) 05/30/2020   Malignant neoplasm of right female breast (HCC) 05/13/2018   Venous insufficiency of both lower extremities 11/20/2017   Chronic diastolic CHF (congestive heart failure) (HCC) 11/20/2017   Weight gain 11/20/2017   Lymphedema 04/19/2017   Benign essential HTN 04/19/2017   OSA (obstructive sleep apnea) 04/19/2017   Morbid obesity (HCC)  04/19/2017   TIA (transient ischemic attack) 03/28/2017   Urinary incontinence 06/27/2016   Breast cancer (HCC) 09/08/2004      Recommendations/Plan: Continue care   Code Status:    Code Status Orders  (From admission, onward)           Start     Ordered   07/03/24 1339  Full code  Continuous       Question:  By:  Answer:  Default: patient does not have capacity for decision making, no surrogate or prior directive available   07/03/24 1339           Code Status History     Date Active Date Inactive Code Status Order ID Comments User Context   04/18/2023 0322 04/28/2023 1907 Full Code [id number]  Lawence Madison LABOR, MD ED   03/29/2023 1955 03/31/2023 2043 Full Code [id number]  Tobie Mario GAILS, MD ED   09/10/2022 1318 09/16/2022 1911 Full Code [id number]  Celinda Alm Lot, MD ED   08/22/2022 1541 08/23/2022 2011 Full Code [id number]  Cox, Amy N, DO ED   06/13/2022 0404 06/15/2022 1820 Full Code [id number]  Cleatus Delayne GAILS, MD ED   03/03/2022 0637 03/07/2022 1523 Full Code [id number]  Debby Camila LABOR, MD ED   03/28/2017 0418 03/28/2017 2122 Full Code [id number]  Lonzo Pollen, MD Inpatient      Advance Directive Documentation    Flowsheet Row Most Recent Value  Type of Advance Directive Out of facility DNR (pink MOST or yellow form)  Pre-existing out of facility DNR order (yellow form or pink MOST form) Pink MOST form placed in chart (order not valid for inpatient use)  MOST Form in Place? --   Camelia Lewis, NP  Please contact Palliative Medicine Team phone at [phone] for questions and concerns.

## 2024-07-10 NOTE — Progress Notes (Signed)
 Accompanied patient to IR this morning for placement of dobhoff.  Patient, transport, and myself left at 0800, dobhoff and bridle placed by 1000.  Upon return to unit notified provider of continued spikes in heart rate up to the 150s.  Will continue to monitor patient.

## 2024-07-10 NOTE — Progress Notes (Addendum)
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 NOT DETECTED NOT DETECTED Final    Comment: (NOTE) The Coronavirus on the Respiratory Panel, DOES NOT test for the novel  Coronavirus (2019 nCoV)    Coronavirus HKU1 NOT DETECTED NOT DETECTED Final   Coronavirus NL63 NOT DETECTED NOT DETECTED Final   Coronavirus OC43 NOT DETECTED NOT DETECTED Final    Metapneumovirus NOT DETECTED NOT DETECTED Final   Rhinovirus / Enterovirus NOT DETECTED NOT DETECTED Final   Influenza A NOT DETECTED NOT DETECTED Final   Influenza B NOT DETECTED NOT DETECTED Final   Parainfluenza Virus 1 NOT DETECTED NOT DETECTED Final   Parainfluenza Virus 2 NOT DETECTED NOT DETECTED Final   Parainfluenza Virus 3 NOT DETECTED NOT DETECTED Final   Parainfluenza Virus 4 NOT DETECTED NOT DETECTED Final   Respiratory Syncytial Virus NOT DETECTED NOT DETECTED Final   Bordetella pertussis NOT DETECTED NOT DETECTED Final   Bordetella Parapertussis NOT DETECTED NOT DETECTED Final   Chlamydophila pneumoniae NOT DETECTED NOT DETECTED Final   Mycoplasma pneumoniae NOT DETECTED NOT DETECTED Final    Comment: Performed at [facility], [address], [place], [place] [address]    Procedures and diagnostic studies:  No results found.             LOS: 7 days   Josean Lycan  [facility]   Pager on [email or web address]. If 7PM-7AM, please contact night-coverage at [email or web address]     07/10/2024, 11:32 AM

## 2024-07-11 DIAGNOSIS — Z7189 Other specified counseling: Secondary | ICD-10-CM | POA: Diagnosis not present

## 2024-07-11 DIAGNOSIS — R7881 Bacteremia: Secondary | ICD-10-CM | POA: Diagnosis not present

## 2024-07-11 DIAGNOSIS — Z789 Other specified health status: Secondary | ICD-10-CM

## 2024-07-11 LAB — MAGNESIUM: Magnesium: 2.4 mg/dL (ref 1.7–2.4)

## 2024-07-11 LAB — RENAL FUNCTION PANEL
Albumin: 1.5 g/dL — ABNORMAL LOW (ref 3.5–5.0)
Anion gap: 10 (ref 5–15)
BUN: 58 mg/dL — ABNORMAL HIGH (ref 8–23)
CO2: 26 mmol/L (ref 22–32)
Calcium: 8 mg/dL — ABNORMAL LOW (ref 8.9–10.3)
Chloride: 112 mmol/L — ABNORMAL HIGH (ref 98–111)
Creatinine, Ser: 0.9 mg/dL (ref 0.44–1.00)
GFR, Estimated: >60 mL/min (ref >60)
Glucose, Bld: 289 mg/dL — ABNORMAL HIGH (ref 70–99)
Phosphorus: 3.8 mg/dL (ref 2.5–4.6)
Potassium: 3.3 mmol/L — ABNORMAL LOW (ref 3.5–5.1)
Sodium: 148 mmol/L — ABNORMAL HIGH (ref 135–145)

## 2024-07-11 LAB — GLUCOSE, CAPILLARY
Glucose-Capillary: 136 mg/dL — ABNORMAL HIGH (ref 70–99)
Glucose-Capillary: 139 mg/dL — ABNORMAL HIGH (ref 70–99)
Glucose-Capillary: 241 mg/dL — ABNORMAL HIGH (ref 70–99)
Glucose-Capillary: 264 mg/dL — ABNORMAL HIGH (ref 70–99)
Glucose-Capillary: 310 mg/dL — ABNORMAL HIGH (ref 70–99)

## 2024-07-11 LAB — CBC
HCT: 31.8 % — ABNORMAL LOW (ref 36.0–46.0)
Hemoglobin: 10.2 g/dL — ABNORMAL LOW (ref 12.0–15.0)
MCH: 28.7 pg (ref 26.0–34.0)
MCHC: 32.1 g/dL (ref 30.0–36.0)
MCV: 89.3 fL (ref 80.0–100.0)
Platelets: 91 K/uL — ABNORMAL LOW (ref 150–400)
RBC: 3.56 MIL/uL — ABNORMAL LOW (ref 3.87–5.11)
RDW: 16.2 % — ABNORMAL HIGH (ref 11.5–15.5)
WBC: 10.7 K/uL — ABNORMAL HIGH (ref 4.0–10.5)
nRBC: 0.8 % — ABNORMAL HIGH (ref 0.0–0.2)

## 2024-07-11 LAB — SODIUM: Sodium: 146 mmol/L — ABNORMAL HIGH (ref 135–145)

## 2024-07-11 MED ORDER — SENNOSIDES-DOCUSATE SODIUM 8.6-50 MG PO TABS
1.0000 | ORAL_TABLET | Freq: Every day | ORAL | Status: DC
  Administered 2024-07-11 – 2024-07-19 (×2): 1 via NASOGASTRIC
  Filled 2024-07-11 (×5): qty 1

## 2024-07-11 MED ORDER — DEXTROSE 5 % IV SOLN: INTRAVENOUS | Status: DC

## 2024-07-11 MED ORDER — POTASSIUM CHLORIDE 10 MEQ/100ML IV SOLN
10.0000 meq | INTRAVENOUS | Status: AC
  Administered 2024-07-11 (×4): 10 meq via INTRAVENOUS
  Filled 2024-07-11 (×4): qty 100

## 2024-07-11 MED ORDER — FREE WATER
200.0000 mL | Status: DC
  Administered 2024-07-11 – 2024-07-12 (×4): 200 mL

## 2024-07-11 NOTE — Progress Notes (Signed)
 Nutrition Brief Note   DOCUMENTATION CODES:   Obesity unspecified  INTERVENTION:   Osmolite 1.5@65ml /hr- continue increasing by 10ml/hr q 8 hours until goal rate is reached.   ProSource TF 20- Give 60ml daily via tube, each supplement provides 80kcal and 20g of protein.  Free water flushes q4 hours   Regimen provides 2420kcal/day, 118g/day protein and 2382ml/day of free water.   Pt remains at high refeed risk; recommend monitor potassium, magnesium and phosphorus labs daily until stable  Vitamin C  500mg  BID via tube  Thiamine 100mg  daily via tube   Daily weights   NUTRITION DIAGNOSIS:   Inadequate oral intake related to lethargy/confusion as evidenced by NPO status. -ongoing   GOAL:   Patient will meet greater than or equal to 90% of their needs -progressing   MONITOR:   Diet advancement, Labs, Weight trends, TF tolerance, I & O's, Skin  ASSESSMENT:   78 y/o female with h/o CHF, mild mitral valve regurgitation, CKD stage IIIb, DVT s/p IV filter (03/2023), PVD, COPD, hiatal hernia, venous insufficiency, GERD, HTN, heart murmur, lymphedema,TIA , obesity, breast cancer s/p mastectomy and chemoradiation with brain metastasis, OSA, dysphagia, SBO (s/p exploratory laparotomy, extensive enterolysis and removal of foreign object 2024), ventral hernia (incarcerated omentum s/p open mesh repair and omental resection 2007) and recent water scald that required treatment at the [facility] (March) and who is now admitted with AMS, septic shock, bateremia and AKI.  -Pt s/p fluoroscopy guided dobhoff tube placement 11/2  Visited pt's room today. Pt agitated with AMS. Pt remains NPO. NGT unable to be placed by nursing. Pt s/p fluoroscopy guided dobhoff tube placement yesterday. Pt is tolerating tube feeds at 83ml/hr currently. Will continue increasing feeds towards goal rate. Pt is refeeding; electrolytes are being monitored and supplemented. Pt with mild hypernatremia today;  will increase free water. No BM since 10/30; MD notified. Per chart, pt is up ~6lbs from admission. Pt +9.4L on her I & Os. Palliative care following for GOC.      Medications reviewed and include: vitamin C , solu-cortef, protonix , thiamine, 5% dextrose  @75ml /hr, meropenem  Labs reviewed: Na 148(H), K 3.3(L), BUN 58(H), P 3.8 wnl, Mg 2.4 wnl Wbc- 10.7(H), Hgb 10.2(L), Hct 31.8(L) Cbgs- 264, 136, 139 x 24 hrs   UOP-   Diet Order:   Diet Order             Diet NPO time specified  Diet effective now                  EDUCATION NEEDS:   No education needs have been identified at this time  Skin:  Skin Assessment: Reviewed RN Assessment   -DTI on sacrum- 2 cm x 2 cm x 0.1 cm. -Right hip due friction- 8 cm x 1 cm x 0.1 cm. -Right ankle full thickness (non-pressure ulcer)- 2 cm x 2 cm. -Left heel (non-pressure ulcer).  -Left thigh- 5 cm x 15 cm x 0.1 cm.  Last BM:  10/30- TYPE 6  Height:   Ht Readings from Last 1 Encounters:  07/03/24 5' 6 (1.676 m)    Weight:   Wt Readings from Last 1 Encounters:  07/11/24 112.3 kg    Ideal Body Weight:  59 kg  BMI:  Body mass index is 39.96 kg/m.  Estimated Nutritional Needs:   Kcal:  2100-2400kcal/day  Protein:  110-120g/day  Fluid:  1.6-1.8L/day  Augustin Shams MS, RD, LDN If unable to be reached, please send secure chat  to RD inpatient available from 8:00a-4:00p daily

## 2024-07-11 NOTE — Progress Notes (Addendum)
 Daily Progress Note   Patient Name: [name]       Date: 07/11/2024 DOB: 02-08-1946  Age: 78 y.o. MRN#: [id number] Attending Physician: Trudy Anthony HERO, MD Primary Care Physician: Lorel Maxie LABOR, MD Admit Date: 07/03/2024  Reason for Consultation/Follow-up: Establishing goals of care  Subjective: Notes and labs reviewed.  Case discussed with attending.  In to see patient.  She is yelling out and moaning.  She does not speak to me.  No family at bedside at this time.  Called to speak with patient's daughter.  She discusses ongoing conversations with multiple family members of which she and her brother both trust.  She discusses different family members/ pastor that patient has spoken with and told that she was tired.  We discussed that yesterday son stated to me during meeting he did not want CPR for his mother, and did not want her to suffer.  She she reflects on patient's life and her close walk with God.  She discusses the things that she has done for others and kindness and love that she has shown to everyone all of her life.  We discussed patient's status.  We discussed at length, scenarios on the other side of this hospitalization.  We discussed acceptable quality of life.  We discussed death and the dying process.  We discussed artificial means in which we try to keep a person alive.  We discussed CPR in detail.  We discussed the conversation that was had with patient's son yesterday, and she states they are both in agreement for no CPR.  She states she will speak with her brother and they are working towards agreements on other time limits and boundaries of care.  She will try to find a time they both can come in tomorrow for continued goals of care conversation.     Length of  Stay: 8  Current Medications: Scheduled Meds:   vitamin C   500 mg Per Tube BID   Chlorhexidine  Gluconate Cloth  6 each Topical Daily   feeding supplement (PROSource TF20)  60 mL Per Tube Daily   free water  100 mL Per Tube Q4H   Gerhardt's butt cream   Topical BID   hydrocortisone sod succinate (SOLU-CORTEF) inj  100 mg Intravenous Q12H   metoprolol  tartrate  25 mg Per Tube BID  mouth rinse  15 mL Mouth Rinse 4 times per day   pantoprazole  (PROTONIX ) IV  40 mg Intravenous Q24H   thiamine  100 mg Per Tube Daily    Continuous Infusions:  dextrose  75 mL/hr at 07/11/24 1300   feeding supplement (OSMOLITE 1.5 CAL) 40 mL/hr at 07/11/24 1300   meropenem (MERREM) IV Stopped (07/11/24 0636)   potassium chloride  100 mL/hr at 07/11/24 1300    PRN Meds: dextrose , docusate sodium , fentaNYL  (SUBLIMAZE ) injection, Influenza vac split trivalent PF, ipratropium-albuterol , morphine  injection, mouth rinse, polyethylene glycol  Physical Exam Constitutional:      Comments: Eyes closed but moans out.  Pulmonary:     Effort: Pulmonary effort is normal.  Skin:    General: Skin is warm and dry.             Vital Signs: BP (!) 136/54   Pulse 90   Temp 98.4 F (36.9 C) (Axillary)   Resp (!) 23   Ht 5' 6 (1.676 m)   Wt 112.3 kg   SpO2 100%   BMI 39.96 kg/m  SpO2: SpO2: 100 % O2 Device: O2 Device: Room Air O2 Flow Rate: O2 Flow Rate (L/min): 2 L/min  Intake/output summary:  Intake/Output Summary (Last 24 hours) at 07/11/2024 1340 Last data filed at 07/11/2024 1300 Gross per 24 hour  Intake 1649.58 ml  Output 525 ml  Net 1124.58 ml   LBM: Last BM Date : 07/05/24 Baseline Weight: Weight: 109.6 kg Most recent weight: Weight: 112.3 kg   Patient Active Problem List   Diagnosis Date Noted   Sepsis (HCC) 07/03/2024   Lower urinary tract infectious disease 07/03/2024   Hypokalemia 04/24/2023   Acute postoperative anemia due to expected blood loss 04/22/2023   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   Hypernatremia 04/20/2023   Acute metabolic encephalopathy 04/20/2023   Hypoglycemia 04/19/2023   Aspiration pneumonia of right lower lobe due to vomit (HCC) 04/19/2023   SBO (small bowel obstruction) (HCC) 04/18/2023   Coffee ground emesis 04/18/2023   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/18/2023   Peripheral neuropathy 04/18/2023   Acute DVT of right tibial vein (HCC) 03/30/2023   Facial weakness 03/30/2023   DVT (deep venous thrombosis) (HCC) 03/30/2023   Mouth droop due to facial weakness 03/29/2023   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) 03/29/2023   Cellulitis of lower extremity 09/10/2022   Near syncope 08/22/2022   Constipation 08/22/2022   Stage 3a chronic kidney disease (HCC) 06/13/2022   Ulcers of both lower extremities, with unspecified severity (HCC) 03/03/2022   Obesity (BMI 30-39.9) 03/03/2022   Anemia of chronic disease 03/03/2022   PAD (peripheral artery disease) 05/30/2020   Malignant neoplasm of right female breast (HCC) 05/13/2018   Venous insufficiency of both lower extremities 11/20/2017   Chronic diastolic CHF (congestive heart failure) (HCC) 11/20/2017   Weight gain 11/20/2017   Lymphedema 04/19/2017   Benign essential HTN 04/19/2017   OSA (obstructive sleep apnea) 04/19/2017   Morbid obesity (HCC) 04/19/2017   TIA (transient ischemic attack) 03/28/2017   Urinary incontinence 06/27/2016   Breast cancer (HCC) 09/08/2004    Palliative Care Assessment & Plan   Recommendations/Plan:  DNR Full scope  Hopeful for both children to be able to meet tomorrow for further goals of care conversation.  Code Status:    Code Status Orders  (From admission, onward)           Start     Ordered  07/03/24 1339  Full code  Continuous       Question:  By:  Answer:  Default: patient does not have capacity for decision making, no surrogate or prior directive available   07/03/24 1339            Code Status History     Date Active Date Inactive Code Status Order ID Comments User Context   04/18/2023 0322 04/28/2023 1907 Full Code [id number]  Lawence Madison LABOR, MD ED   03/29/2023 1955 03/31/2023 2043 Full Code [id number]  Tobie Mario GAILS, MD ED   09/10/2022 1318 09/16/2022 1911 Full Code [id number]  Celinda Alm Lot, MD ED   08/22/2022 1541 08/23/2022 2011 Full Code [id number]  Cox, Amy N, DO ED   06/13/2022 0404 06/15/2022 1820 Full Code [id number]  Cleatus Delayne GAILS, MD ED   03/03/2022 0637 03/07/2022 1523 Full Code [id number]  Debby Camila LABOR, MD ED   03/28/2017 0418 03/28/2017 2122 Full Code [id number]  Lonzo Pollen, MD Inpatient      Advance Directive Documentation    Flowsheet Row Most Recent Value  Type of Advance Directive Out of facility DNR (pink MOST or yellow form)  Pre-existing out of facility DNR order (yellow form or pink MOST form) Pink MOST form placed in chart (order not valid for inpatient use)  MOST Form in Place? --    Camelia Lewis, NP  Please contact Palliative Medicine Team phone at [phone] for questions and concerns.

## 2024-07-11 NOTE — Plan of Care (Signed)
  Problem: Education: Goal: Knowledge of General Education information will improve Description: Including pain rating scale, medication(s)/side effects and non-pharmacologic comfort measures Outcome: Progressing   Problem: Nutrition: Goal: Adequate nutrition will be maintained Outcome: Progressing   Problem: Coping: Goal: Level of anxiety will decrease Outcome: Progressing   Problem: Elimination: Goal: Will not experience complications related to urinary retention Outcome: Progressing   Problem: Pain Managment: Goal: General experience of comfort will improve and/or be controlled Outcome: Progressing   Problem: Safety: Goal: Ability to remain free from injury will improve Outcome: Progressing
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
## 2024-07-11 NOTE — Progress Notes (Signed)
   07/11/24 1000  Spiritual Encounters  Type of Visit Initial  Care provided to: Patient  Conversation partners present during encounter Nurse  Referral source Chaplain team  Reason for visit Routine spiritual support  OnCall Visit No  Interventions  Spiritual Care Interventions Made Compassionate presence;Encouragement  Intervention Outcomes  Outcomes Connection to spiritual care   Chaplain visited patient due to referral from Chaplain who had on-call duty overnight. Patient was unable to respond to the Chaplain. Chaplain encouraged the patient and spoke words of comfort although patient was unable to respond verbally. Patient seemingly was more peaceful with the Chaplain in the room, just prior to entry the patient was yelling out.

## 2024-07-12 DIAGNOSIS — R7881 Bacteremia: Secondary | ICD-10-CM | POA: Diagnosis not present

## 2024-07-12 DIAGNOSIS — Z7189 Other specified counseling: Secondary | ICD-10-CM | POA: Diagnosis not present

## 2024-07-12 LAB — GLUCOSE, CAPILLARY
Glucose-Capillary: 144 mg/dL — ABNORMAL HIGH (ref 70–99)
Glucose-Capillary: 216 mg/dL — ABNORMAL HIGH (ref 70–99)
Glucose-Capillary: 318 mg/dL — ABNORMAL HIGH (ref 70–99)
Glucose-Capillary: 320 mg/dL — ABNORMAL HIGH (ref 70–99)
Glucose-Capillary: 337 mg/dL — ABNORMAL HIGH (ref 70–99)
Glucose-Capillary: 389 mg/dL — ABNORMAL HIGH (ref 70–99)

## 2024-07-12 LAB — PHOSPHORUS: Phosphorus: 2.5 mg/dL (ref 2.5–4.6)

## 2024-07-12 LAB — CBC
HCT: 34 % — ABNORMAL LOW (ref 36.0–46.0)
Hemoglobin: 10.5 g/dL — ABNORMAL LOW (ref 12.0–15.0)
MCH: 28.3 pg (ref 26.0–34.0)
MCHC: 30.9 g/dL (ref 30.0–36.0)
MCV: 91.6 fL (ref 80.0–100.0)
Platelets: 145 K/uL — ABNORMAL LOW (ref 150–400)
RBC: 3.71 MIL/uL — ABNORMAL LOW (ref 3.87–5.11)
RDW: 16.4 % — ABNORMAL HIGH (ref 11.5–15.5)
WBC: 11.6 K/uL — ABNORMAL HIGH (ref 4.0–10.5)
nRBC: 0.5 % — ABNORMAL HIGH (ref 0.0–0.2)

## 2024-07-12 LAB — BASIC METABOLIC PANEL WITH GFR
Anion gap: 10 (ref 5–15)
BUN: 58 mg/dL — ABNORMAL HIGH (ref 8–23)
CO2: 24 mmol/L (ref 22–32)
Calcium: 8.1 mg/dL — ABNORMAL LOW (ref 8.9–10.3)
Chloride: 113 mmol/L — ABNORMAL HIGH (ref 98–111)
Creatinine, Ser: 0.68 mg/dL (ref 0.44–1.00)
GFR, Estimated: >60 mL/min (ref >60)
Glucose, Bld: 441 mg/dL — ABNORMAL HIGH (ref 70–99)
Potassium: 3.2 mmol/L — ABNORMAL LOW (ref 3.5–5.1)
Sodium: 147 mmol/L — ABNORMAL HIGH (ref 135–145)

## 2024-07-12 LAB — MAGNESIUM: Magnesium: 2.6 mg/dL — ABNORMAL HIGH (ref 1.7–2.4)

## 2024-07-12 MED ORDER — INSULIN ASPART 100 UNIT/ML IJ SOLN
0.0000 [IU] | INTRAMUSCULAR | Status: DC
  Administered 2024-07-12: 2 [IU] via SUBCUTANEOUS
  Administered 2024-07-12: 11 [IU] via SUBCUTANEOUS
  Administered 2024-07-12: 5 [IU] via SUBCUTANEOUS
  Administered 2024-07-12: 11 [IU] via SUBCUTANEOUS
  Administered 2024-07-13 (×2): 5 [IU] via SUBCUTANEOUS
  Administered 2024-07-13: 11 [IU] via SUBCUTANEOUS
  Administered 2024-07-13 – 2024-07-14 (×2): 3 [IU] via SUBCUTANEOUS
  Administered 2024-07-14: 2 [IU] via SUBCUTANEOUS
  Administered 2024-07-14: 8 [IU] via SUBCUTANEOUS
  Administered 2024-07-14: 5 [IU] via SUBCUTANEOUS
  Administered 2024-07-14: 3 [IU] via SUBCUTANEOUS
  Administered 2024-07-15: 8 [IU] via SUBCUTANEOUS
  Administered 2024-07-15 (×3): 2 [IU] via SUBCUTANEOUS
  Administered 2024-07-16 (×2): 3 [IU] via SUBCUTANEOUS
  Administered 2024-07-16: 2 [IU] via SUBCUTANEOUS
  Administered 2024-07-16: 8 [IU] via SUBCUTANEOUS
  Administered 2024-07-16: 3 [IU] via SUBCUTANEOUS
  Administered 2024-07-16: 2 [IU] via SUBCUTANEOUS
  Administered 2024-07-17: 5 [IU] via SUBCUTANEOUS
  Administered 2024-07-17 (×2): 8 [IU] via SUBCUTANEOUS
  Filled 2024-07-12 (×26): qty 1

## 2024-07-12 MED ORDER — MORPHINE SULFATE (PF) 4 MG/ML IV SOLN
4.0000 mg | INTRAVENOUS | Status: DC | PRN
  Administered 2024-07-12 – 2024-07-16 (×15): 4 mg via INTRAVENOUS
  Filled 2024-07-12 (×15): qty 1

## 2024-07-12 MED ORDER — FREE WATER
250.0000 mL | Status: DC
  Administered 2024-07-12 – 2024-07-13 (×5): 250 mL

## 2024-07-12 MED ORDER — POTASSIUM CHLORIDE 10 MEQ/100ML IV SOLN
10.0000 meq | INTRAVENOUS | Status: AC
  Administered 2024-07-12 (×4): 10 meq via INTRAVENOUS
  Filled 2024-07-12 (×4): qty 100

## 2024-07-12 MED ORDER — INSULIN ASPART 100 UNIT/ML IJ SOLN
3.0000 [IU] | INTRAMUSCULAR | Status: DC
  Administered 2024-07-12 – 2024-07-20 (×43): 3 [IU] via SUBCUTANEOUS
  Filled 2024-07-12 (×3): qty 1
  Filled 2024-07-12 (×2): qty 3
  Filled 2024-07-12 (×4): qty 1
  Filled 2024-07-12: qty 3
  Filled 2024-07-12 (×2): qty 1
  Filled 2024-07-12: qty 3
  Filled 2024-07-12 (×4): qty 1
  Filled 2024-07-12 (×3): qty 3
  Filled 2024-07-12 (×2): qty 1
  Filled 2024-07-12: qty 3
  Filled 2024-07-12 (×8): qty 1
  Filled 2024-07-12: qty 3
  Filled 2024-07-12: qty 1
  Filled 2024-07-12: qty 3
  Filled 2024-07-12 (×6): qty 1
  Filled 2024-07-12: qty 3
  Filled 2024-07-12: qty 1
  Filled 2024-07-12: qty 3

## 2024-07-12 NOTE — Progress Notes (Addendum)
 Daily Progress Note   Patient Name: [name]       Date: 07/12/2024 DOB: [birth year]  Age: 78 y.o. MRN#: [id number] Attending Physician: Trudy Anthony HERO, MD Primary Care Physician: Lorel Maxie LABOR, MD Admit Date: 07/03/2024  Reason for Consultation/Follow-up: Establishing goals of care  Subjective: Notes and labs reviewed.  Meeting scheduled for 3:00, in to bedside.    In to see patient.  She is currently opening her eyes and frequently, but does not try to speak.  Her brother is at bedside.  He states states that they had other siblings but they have all died and so he and the patient are all that are left.  He states he has not seen his sister in a while.  He states he spent the night and has been here all day.  Patient's son Dorn arrived at bedside.  Discussed patient's status and updates.  Discussed conversation regarding DNR status and son is in agreement.  Jonathan's son arrived to bedside.  Patient's daughter arrived at bedside.  Reviewed status and updates. They discuss their anger and frustration with SNF.   We discussed her diagnoses in great detail, prognosis, GOC, EOL wishes disposition and options.  Created space and opportunity for patient  to explore thoughts and feelings regarding current medical information.   A detailed discussion was had today regarding advanced directives.  Concepts specific to code status, artifical feeding and hydration, IV antibiotics and rehospitalization were discussed.  The difference between an aggressive medical intervention path and a comfort care path was discussed.  Values and goals of care important to patient and family were attempted to be elicited.  Discussed limitations of medical interventions to prolong quality of life in some  situations and discussed the concept of human mortality.  Patient is a woman of deep Christian faith.  Family states they are a family of Christian faith as well.  We discussed various scenarios.  We discussed pain and suffering.  We discussed patient autonomy.  They advised they understand what is being said, and son states  we are not giving up, we just need to have more faith.  Discussed considering what if's in the future, timelines, and boundaries to care.   Daughter understands diagnoses and prognosis.  Daughter and son legally will need to make decisions together as patient  has a durable POA but not an H POA.  Per nursing, patient has diarrhea and is requiring frequent changes.  Staff and to work with patient.    Length of Stay: 9  Current Medications: Scheduled Meds:   vitamin C   500 mg Per Tube BID   Chlorhexidine  Gluconate Cloth  6 each Topical Daily   feeding supplement (PROSource TF20)  60 mL Per Tube Daily   free water  250 mL Per Tube Q4H   Gerhardt's butt cream   Topical BID   hydrocortisone sod succinate (SOLU-CORTEF) inj  100 mg Intravenous Q12H   insulin  aspart  0-15 Units Subcutaneous Q4H   insulin  aspart  3 Units Subcutaneous Q4H   metoprolol  tartrate  25 mg Per Tube BID   mouth rinse  15 mL Mouth Rinse 4 times per day   pantoprazole  (PROTONIX ) IV  40 mg Intravenous Q24H   senna-docusate  1 tablet Per NG tube QHS   thiamine  100 mg Per Tube Daily    Continuous Infusions:  feeding supplement (OSMOLITE 1.5 CAL) 65 mL/hr at 07/12/24 0701   meropenem (MERREM) IV 1 g (07/12/24 1427)    PRN Meds: dextrose , docusate sodium , fentaNYL  (SUBLIMAZE ) injection, Influenza vac split trivalent PF, ipratropium-albuterol , morphine  injection, mouth rinse, polyethylene glycol  Physical Exam Constitutional:      Comments: Opens eyes intermittently but does not speak  Pulmonary:     Effort: Pulmonary effort is normal.  Skin:    General: Skin is warm and dry.              Vital Signs: BP (!) 151/73   Pulse (!) 104   Temp 97.6 F (36.4 C) (Axillary)   Resp 15   Ht 5' 6 (1.676 m)   Wt 107.5 kg   SpO2 99%   BMI 38.25 kg/m  SpO2: SpO2: 99 % O2 Device: O2 Device: Room Air O2 Flow Rate: O2 Flow Rate (L/min): 2 L/min  Intake/output summary:  Intake/Output Summary (Last 24 hours) at 07/12/2024 1650 Last data filed at 07/12/2024 1022 Gross per 24 hour  Intake 2513.32 ml  Output 1010 ml  Net 1503.32 ml   LBM: Last BM Date : (S) 07/12/24 Baseline Weight: Weight: 109.6 kg Most recent weight: Weight: 107.5 kg         Patient Active Problem List   Diagnosis Date Noted   Sepsis (HCC) 07/03/2024   Lower urinary tract infectious disease 07/03/2024   Hypokalemia 04/24/2023   Acute postoperative anemia due to expected blood loss 04/22/2023   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   Hypernatremia 04/20/2023   Acute metabolic encephalopathy 04/20/2023   Hypoglycemia 04/19/2023   Aspiration pneumonia of right lower lobe due to vomit (HCC) 04/19/2023   SBO (small bowel obstruction) (HCC) 04/18/2023   Coffee ground emesis 04/18/2023   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/18/2023   Peripheral neuropathy 04/18/2023   Acute DVT of right tibial vein (HCC) 03/30/2023   Facial weakness 03/30/2023   DVT (deep venous thrombosis) (HCC) 03/30/2023   Mouth droop due to facial weakness 03/29/2023   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) 03/29/2023   Cellulitis of lower extremity 09/10/2022   Near syncope 08/22/2022   Constipation 08/22/2022   Stage 3a chronic kidney disease (HCC) 06/13/2022   Ulcers of both lower extremities, with unspecified severity (HCC) 03/03/2022   Obesity (BMI 30-39.9) 03/03/2022   Anemia of chronic disease 03/03/2022   PAD (peripheral artery disease) 05/30/2020  Malignant neoplasm of right female breast (HCC) 05/13/2018   Venous insufficiency of both lower extremities 11/20/2017    Chronic diastolic CHF (congestive heart failure) (HCC) 11/20/2017   Weight gain 11/20/2017   Lymphedema 04/19/2017   Benign essential HTN 04/19/2017   OSA (obstructive sleep apnea) 04/19/2017   Morbid obesity (HCC) 04/19/2017   TIA (transient ischemic attack) 03/28/2017   Urinary incontinence 06/27/2016   Breast cancer (HCC) 09/08/2004    Palliative Care Assessment & Plan   Recommendations/Plan: Continue current care   Code Status:    Code Status Orders  (From admission, onward)           Start     Ordered   07/11/24 1401  Do not attempt resuscitation (DNR) Pre-Arrest Interventions Desired  (Code Status)  Continuous       Question Answer Comment  If pulseless and not breathing No CPR or chest compressions.   In Pre-Arrest Conditions (Patient Has Pulse and Is Breathing) May intubate, use advanced airway interventions and cardioversion/ACLS medications if appropriate or indicated. May transfer to ICU.   Consent: Discussion documented in EHR or advanced directives reviewed      07/11/24 1400           Code Status History     Date Active Date Inactive Code Status Order ID Comments User Context   07/03/2024 1339 07/11/2024 1400 Full Code [id number]  Maranda Lonell MATSU, NP ED   04/18/2023 0322 04/28/2023 1907 Full Code [id number]  Mansy, Madison LABOR, MD ED   03/29/2023 1955 03/31/2023 2043 Full Code [id number]  Tobie Mario GAILS, MD ED   09/10/2022 1318 09/16/2022 1911 Full Code [id number]  Celinda Alm Lot, MD ED   08/22/2022 1541 08/23/2022 2011 Full Code [id number]  Cox, Amy N, DO ED   06/13/2022 0404 06/15/2022 1820 Full Code [id number]  Cleatus Delayne GAILS, MD ED   03/03/2022 0637 03/07/2022 1523 Full Code [id number]  Debby Camila LABOR, MD ED   03/28/2017 0418 03/28/2017 2122 Full Code [id number]  Lonzo Pollen, MD Inpatient      Advance Directive Documentation    Flowsheet Row Most Recent Value  Type of Advance Directive Out of facility DNR (pink MOST or yellow form)  Pre-existing out of  facility DNR order (yellow form or pink MOST form) Pink MOST form placed in chart (order not valid for inpatient use)  MOST Form in Place? --    Prognosis: Poor  Camelia Lewis, NP  DSS hotline contacted 10:50am based discussions with family and family's concerns.   Please contact Palliative Medicine Team phone at [phone] for questions and concerns.

## 2024-07-12 NOTE — Progress Notes (Signed)
 SLP Cancellation Note  Patient Details Name: [name] MRN: [id number] DOB: April 29, 1946   Cancelled treatment:       Reason Eval/Treat Not Completed: Patient's level of consciousness;Patient not medically ready (chart reviewed; consulted NSG. Pt is Not appropriate for therapy session/po's. Family meeting is today for GOC.) ST services will monitor appropriateness for ST services next 1-2 days.     Comer Portugal, MS, CCC-SLP Speech Language Pathologist Rehab Services; [facility] [phone] (ascom) Buster Schueller 07/12/2024, 1:27 PM

## 2024-07-12 NOTE — Progress Notes (Signed)
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 (07/12/24 0941)     LOS: 9 days       Anthony CHRISTELLA Pouch, MD [facility] Pager [phone]  If 7PM-7AM, please contact night-coverage [email or web address] 07/12/2024, 9:47 AM

## 2024-07-12 NOTE — Plan of Care (Signed)
  Problem: Coping: Goal: Ability to adjust to condition or change in health will improve Outcome: Progressing   Problem: Fluid Volume: Goal: Ability to maintain a balanced intake and output will improve Outcome: Progressing   Problem: Nutritional: Goal: Maintenance of adequate nutrition will improve Outcome: Progressing

## 2024-07-12 NOTE — Inpatient Diabetes Management (Signed)
 Inpatient Diabetes Program Recommendations  AACE/ADA: New Consensus Statement on Inpatient Glycemic Control (2015)  Target Ranges:  Prepandial:   less than 140 mg/dL      Peak postprandial:   less than 180 mg/dL (1-2 hours)      Critically ill patients:  140 - 180 mg/dL   Lab Results  Component Value Date   GLUCAP 320 (H) 07/12/2024   HGBA1C 7.5 (H) 07/03/2024    Review of Glycemic Control  Latest Reference Range & Units 07/12/24 00:10 07/12/24 04:29 07/12/24 09:18  Glucose-Capillary 70 - 99 mg/dL 662 (H) 610 (H) 679 (H)   Diabetes history: DM 2 Outpatient Diabetes medications:  Jardiance  10 mg daily Metformin 1000 mg daily Current orders for Inpatient glycemic control:  Novolog 0-15 units q 4 hours Osmolite 65 ml/hr Solucortef 100 mg q 12 hours  Inpatient Diabetes Program Recommendations:    Note CBG's elevated.  Consider adding Novolog 3 units q 4 hours to cover tube feeds.   Thanks,  Randall Bullocks, RN, BC-ADM Inpatient Diabetes Coordinator Pager [phone]  (8a-5p)

## 2024-07-13 DIAGNOSIS — N170 Acute kidney failure with tubular necrosis: Secondary | ICD-10-CM | POA: Diagnosis not present

## 2024-07-13 DIAGNOSIS — R6521 Severe sepsis with septic shock: Secondary | ICD-10-CM | POA: Diagnosis not present

## 2024-07-13 DIAGNOSIS — Z7189 Other specified counseling: Secondary | ICD-10-CM | POA: Diagnosis not present

## 2024-07-13 DIAGNOSIS — A4151 Sepsis due to Escherichia coli [E. coli]: Secondary | ICD-10-CM | POA: Diagnosis not present

## 2024-07-13 LAB — BASIC METABOLIC PANEL WITH GFR
Anion gap: 12 (ref 5–15)
BUN: 61 mg/dL — ABNORMAL HIGH (ref 8–23)
CO2: 25 mmol/L (ref 22–32)
Calcium: 8.1 mg/dL — ABNORMAL LOW (ref 8.9–10.3)
Chloride: 114 mmol/L — ABNORMAL HIGH (ref 98–111)
Creatinine, Ser: 0.67 mg/dL (ref 0.44–1.00)
GFR, Estimated: >60 mL/min (ref >60)
Glucose, Bld: 139 mg/dL — ABNORMAL HIGH (ref 70–99)
Potassium: 3 mmol/L — ABNORMAL LOW (ref 3.5–5.1)
Sodium: 151 mmol/L — ABNORMAL HIGH (ref 135–145)

## 2024-07-13 LAB — CBC
HCT: 34.3 % — ABNORMAL LOW (ref 36.0–46.0)
Hemoglobin: 10.6 g/dL — ABNORMAL LOW (ref 12.0–15.0)
MCH: 28.6 pg (ref 26.0–34.0)
MCHC: 30.9 g/dL (ref 30.0–36.0)
MCV: 92.7 fL (ref 80.0–100.0)
Platelets: 149 K/uL — ABNORMAL LOW (ref 150–400)
RBC: 3.7 MIL/uL — ABNORMAL LOW (ref 3.87–5.11)
RDW: 16.3 % — ABNORMAL HIGH (ref 11.5–15.5)
WBC: 14.5 K/uL — ABNORMAL HIGH (ref 4.0–10.5)
nRBC: 0.6 % — ABNORMAL HIGH (ref 0.0–0.2)

## 2024-07-13 LAB — GLUCOSE, CAPILLARY
Glucose-Capillary: 117 mg/dL — ABNORMAL HIGH (ref 70–99)
Glucose-Capillary: 127 mg/dL — ABNORMAL HIGH (ref 70–99)
Glucose-Capillary: 175 mg/dL — ABNORMAL HIGH (ref 70–99)
Glucose-Capillary: 221 mg/dL — ABNORMAL HIGH (ref 70–99)
Glucose-Capillary: 250 mg/dL — ABNORMAL HIGH (ref 70–99)
Glucose-Capillary: 338 mg/dL — ABNORMAL HIGH (ref 70–99)
Glucose-Capillary: 83 mg/dL (ref 70–99)

## 2024-07-13 LAB — MAGNESIUM: Magnesium: 2.3 mg/dL (ref 1.7–2.4)

## 2024-07-13 LAB — PHOSPHORUS: Phosphorus: 1.7 mg/dL — ABNORMAL LOW (ref 2.5–4.6)

## 2024-07-13 MED ORDER — FREE WATER
200.0000 mL | Status: DC
  Administered 2024-07-13 – 2024-07-16 (×41): 200 mL

## 2024-07-13 MED ORDER — POTASSIUM PHOSPHATES 15 MMOLE/5ML IV SOLN
30.0000 mmol | Freq: Once | INTRAVENOUS | Status: AC
  Administered 2024-07-13: 30 mmol via INTRAVENOUS
  Filled 2024-07-13: qty 10

## 2024-07-13 MED ORDER — FUROSEMIDE 10 MG/ML IJ SOLN
40.0000 mg | Freq: Once | INTRAMUSCULAR | Status: AC
  Administered 2024-07-13: 40 mg via INTRAVENOUS
  Filled 2024-07-13: qty 4

## 2024-07-13 MED ORDER — POTASSIUM CHLORIDE 20 MEQ PO PACK
40.0000 meq | PACK | Freq: Once | ORAL | Status: AC
  Administered 2024-07-13: 40 meq
  Filled 2024-07-13: qty 2

## 2024-07-13 NOTE — Plan of Care (Signed)
  Problem: Nutritional: Goal: Maintenance of adequate nutrition will improve Outcome: Progressing   Problem: Clinical Measurements: Goal: Ability to maintain clinical measurements within normal limits will improve Outcome: Progressing Goal: Will remain free from infection Outcome: Progressing Goal: Diagnostic test results will improve Outcome: Progressing Goal: Respiratory complications will improve Outcome: Progressing Goal: Cardiovascular complication will be avoided Outcome: Progressing

## 2024-07-13 NOTE — Consult Note (Signed)
 PHARMACY CONSULT NOTE - ELECTROLYTES  Pharmacy Consult for Electrolyte Monitoring and Replacement   Recent Labs: Height: 5' 6 (167.6 cm) Weight: 108.1 kg (238 lb 5.1 oz) IBW/kg (Calculated) : 59.3 Estimated Creatinine Clearance: 72.1 mL/min (by C-G formula based on SCr of 0.67 mg/dL). Potassium (mmol/L)  Date Value  07/13/2024 3.0 (L)  03/22/2014 3.4 (L)   Magnesium (mg/dL)  Date Value  88/94/7974 2.3  03/22/2014 1.9   Calcium  (mg/dL)  Date Value  88/94/7974 8.1 (L)   Calcium , Total (mg/dL)  Date Value  92/84/7984 8.2 (L)   Albumin (g/dL)  Date Value  88/96/7974 1.5 (L)  08/22/2012 3.6   Phosphorus (mg/dL)  Date Value  88/94/7974 1.7 (L)   Sodium (mmol/L)  Date Value  07/13/2024 151 (H)  10/01/2023 144  03/22/2014 143   Assessment  [name] is a 78 y.o. female presenting with sepsis. PMH significant for CHF, CKD, PVD, HTN, and breast cancer. Pharmacy has been consulted to monitor and replace electrolytes while in CCU.  Diet: OsmoLite1.5 at 65 ml per tube daily.   Pertinent medications: free water 100 ml q4H.   Goal of Therapy: Electrolytes WNL  Plan:  ---30 mmol IV potassium phosphate x 1 (contains 44mEq IV potassium) ---increase free water to 200 q2h ---F/u with AM labs.   Thank you for allowing pharmacy to be a part of this patient's care.  Adriana Bolster, PharmD, BCPS 07/13/2024 7:20 AM

## 2024-07-13 NOTE — Progress Notes (Signed)
 At bedside to assess for PIV placement. Assessed both lower and upper arms with ultrasound there is no veins found to place a PIV. Due to edema and very limited rang of motion she is not a candidate for  PICC or Midline. If removal of femoral line is needed then internal jugular placement of central line is recommended  . Primary RN aware.

## 2024-07-13 NOTE — Progress Notes (Signed)
 SLP Cancellation Note  Patient Details Name: [name] MRN: [id number] DOB: [birth year]   Cancelled treatment:       Reason Eval/Treat Not Completed: Medical issues which prohibited therapy;Patient's level of consciousness;Patient not medically ready (chart reviewed; consulted Palliative Care, NSG, MD re: pt's status)  Per MD notes, pt remains in CCU w/ reduced alertness and engagement; Chronic pain w/ morphine , fentanyl  prn; Increased dose of morphine  today as pt is having significant pain especially w/ dressing changes/wound care. She opens her eyes at times but does not try to speak.  Palliative Care has been following w/ Family/pt for GOC. She has NGT for TFs.   Pt is not able to actively and safely engage in skilled therapy at this time. ST services will sign off w/ MD to reconsult if pt's overall status becomes appropriate- she is not medically appropriate at this time. Recommend frequent oral care for hygiene and stimulation of swallowing. NSG agreed. MD updated.       Comer Portugal, MS, CCC-SLP Speech Language Pathologist Rehab Services; [facility] [phone] (ascom) Jacqulin Brandenburger 07/13/2024, 11:29 AM
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
## 2024-07-13 NOTE — Plan of Care (Signed)
  Problem: Coping: Goal: Ability to adjust to condition or change in health will improve Outcome: Progressing   Problem: Nutritional: Goal: Maintenance of adequate nutrition will improve Outcome: Progressing   Problem: Nutrition: Goal: Adequate nutrition will be maintained Outcome: Progressing

## 2024-07-13 NOTE — Progress Notes (Signed)
 [id number] Message sent to Dr Awanda regarding foley catheter. Per Dr Awanda will maintain foley catheter and rectal pouch due to wounds.  IV team with attempts for peripheral IV access/ PICC consult. See progress note. Unable to obtain access.   11am Dr Aleskerov at bedside to obtain midline access. Unable to obtain IV access.  Dr Awanda notified. Order placed to maintain femoral line due to no alternative venous access.

## 2024-07-13 NOTE — Progress Notes (Addendum)
 Daily Progress Note   Patient Name: [name]       Date: 07/13/2024 DOB: April 17, 1946  Age: 78 y.o. MRN#: [id number] Attending Physician: Awanda City, MD Primary Care Physician: Lorel Maxie LABOR, MD Admit Date: 07/03/2024  Reason for Consultation/Follow-up: Establishing goals of care  Subjective: Notes and labs reviewed.  Case discussed with nursing.  Case discussed with IV nurse who is unable to place a PICC or midline.  Case discussed with CCM provider who was unable to place a midline.  Case discussed with other team members including nursing, pharmacy and attending via ongoing epic chat.  Attending team checking with IR to see if the patient would be a candidate for PEG tube placement if family chooses this route.  Patient continues to moan with attempts to move her, and now currently rests with her eyes closed.  She does not try to speak.  Called and spoke with daughter Ms. Horstman.  Ms. [place]ico is aware of patient's status and has voiced not wanting her mother to suffer.  She states that she understands her mother's life is limited.  Ms. Hollenback is patient's DPOA.  She does not have H POA documents.  Called to speak with patient's son.  Phone disconnected multiple times and son called back.  We discussed her current status and updates; we discussed her labs. We discussed IV RN's inability to place a PICC line or midline for patient.  We discussed the blistering of her arm and pain the patient has with trying to move her arms or legs.  We discussed CCM's attempts at placing a midline.  Reviewed that patient would not accept food during SLP evaluation.  Reviewed that she would need artificial nutrition to meet calorie needs following this admission.  We reviewed the wounds on her legs, and  dressing changes.  We discussed pain and suffering.  We discussed various scenarios.  He states he will come this afternoon after work to see her, and will speak with his sister regarding plans moving forward.  ADDENDUM: Notified by staff and attending that they will continue patient's current femoral venous IV for access as they are unable to establish PICC or midline.  ADDENDUM: Called to provide updates to daughter.  Support offered as she discusses her mother's status, her mother's history, and her concerns for the future.  She discusses conversations that she has had with her brother.  Questions answered.  While on the phone with her, got a message that patient's son was at bedside.  Went to the bedside to meet with patient's son.  Discussed patient's status.    Patient with diarrhea and frequent turning and moving which causes pain to patient.  He was able to visualize her wounds as nursing was providing dressing changes.  He discusses the facility and his thoughts, anger, and frustration with her facility.  He discusses the things that he did for his mother and taking her things and trying to do the best that he could by her.  He states he does not ever want to lose his mother, but does not want his mother to suffer.  We discussed comfort focused care.  He called to speak with his sister.  PMT stepped out for them to speak.    Upon returning to bedside, he discusses his thoughts on her status and care moving forward.  He states his sister has talked about hospice care, and understands this and does not want for his mother to suffer.  We discussed continued aggressive care versus comfort care.  We discussed potential for hospice facility placement versus remaining here for end-of-life care depending on her status.  While talking, patient awoke.  Son pulled a chair up to the bedside to speak with his mother.  PMT exited room.   Length of Stay: 10  Current Medications: Scheduled Meds:   vitamin  C  500 mg Per Tube BID   Chlorhexidine  Gluconate Cloth  6 each Topical Daily   feeding supplement (PROSource TF20)  60 mL Per Tube Daily   free water  200 mL Per Tube Q2H   Gerhardt's butt cream   Topical BID   hydrocortisone sod succinate (SOLU-CORTEF) inj  100 mg Intravenous Q12H   insulin  aspart  0-15 Units Subcutaneous Q4H   insulin  aspart  3 Units Subcutaneous Q4H   metoprolol  tartrate  25 mg Per Tube BID   mouth rinse  15 mL Mouth Rinse 4 times per day   pantoprazole  (PROTONIX ) IV  40 mg Intravenous Q24H   senna-docusate  1 tablet Per NG tube QHS   thiamine  100 mg Per Tube Daily    Continuous Infusions:  feeding supplement (OSMOLITE 1.5 CAL) 65 mL/hr at 07/13/24 0513   meropenem (MERREM) IV 1 g (07/13/24 0513)   potassium PHOSPHATE IVPB (in mmol) 30 mmol (07/13/24 0816)    PRN Meds: dextrose , docusate sodium , fentaNYL  (SUBLIMAZE ) injection, Influenza vac split trivalent PF, ipratropium-albuterol , morphine  injection, mouth rinse, polyethylene glycol  Physical Exam Constitutional:      Comments: Eyes closed.  Moans out intermittently.  Pulmonary:     Effort: Pulmonary effort is normal.  Skin:    General: Skin is warm and dry.             Vital Signs: BP 109/77   Pulse 88   Temp (!) 97.3 F (36.3 C)   Resp 10   Ht 5' 6 (1.676 m)   Wt 108.1 kg   SpO2 100%   BMI 38.47 kg/m  SpO2: SpO2: 100 % O2 Device: O2 Device: Room Air O2 Flow Rate: O2 Flow Rate (L/min): 2 L/min  Intake/output summary:  Intake/Output Summary (Last 24 hours) at 07/13/2024 1204 Last data filed at 07/13/2024 0513 Gross per 24 hour  Intake 2684.06 ml  Output 450 ml  Net 2234.06 ml   LBM: Last BM Date :  07/13/24 Baseline Weight: Weight: 109.6 kg Most recent weight: Weight: 108.1 kg   Patient Active Problem List   Diagnosis Date Noted   Sepsis (HCC) 07/03/2024   Lower urinary tract infectious disease 07/03/2024   Hypokalemia 04/24/2023   Acute postoperative anemia due to expected blood  loss 04/22/2023   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   Hypernatremia 04/20/2023   Acute metabolic encephalopathy 04/20/2023   Hypoglycemia 04/19/2023   Aspiration pneumonia of right lower lobe due to vomit (HCC) 04/19/2023   SBO (small bowel obstruction) (HCC) 04/18/2023   Coffee ground emesis 04/18/2023   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/18/2023   Peripheral neuropathy 04/18/2023   Acute DVT of right tibial vein (HCC) 03/30/2023   Facial weakness 03/30/2023   DVT (deep venous thrombosis) (HCC) 03/30/2023   Mouth droop due to facial weakness 03/29/2023   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) 03/29/2023   Cellulitis of lower extremity 09/10/2022   Near syncope 08/22/2022   Constipation 08/22/2022   Stage 3a chronic kidney disease (HCC) 06/13/2022   Ulcers of both lower extremities, with unspecified severity (HCC) 03/03/2022   Obesity (BMI 30-39.9) 03/03/2022   Anemia of chronic disease 03/03/2022   PAD (peripheral artery disease) 05/30/2020   Malignant neoplasm of right female breast (HCC) 05/13/2018   Venous insufficiency of both lower extremities 11/20/2017   Chronic diastolic CHF (congestive heart failure) (HCC) 11/20/2017   Weight gain 11/20/2017   Lymphedema 04/19/2017   Benign essential HTN 04/19/2017   OSA (obstructive sleep apnea) 04/19/2017   Morbid obesity (HCC) 04/19/2017   TIA (transient ischemic attack) 03/28/2017   Urinary incontinence 06/27/2016   Breast cancer (HCC) 09/08/2004    Palliative Care Assessment & Plan    Recommendations/Plan: Continue current care. Son states he will come in this evening to see his mother and will speak with his sister further.  Code Status:    Code Status Orders  (From admission, onward)           Start     Ordered   07/11/24 1401  Do not attempt resuscitation (DNR) Pre-Arrest Interventions Desired  (Code Status)  Continuous       Question Answer Comment  If  pulseless and not breathing No CPR or chest compressions.   In Pre-Arrest Conditions (Patient Has Pulse and Is Breathing) May intubate, use advanced airway interventions and cardioversion/ACLS medications if appropriate or indicated. May transfer to ICU.   Consent: Discussion documented in EHR or advanced directives reviewed      07/11/24 1400           Code Status History     Date Active Date Inactive Code Status Order ID Comments User Context   07/03/2024 1339 07/11/2024 1400 Full Code [id number]  Maranda Lonell MATSU, NP ED   04/18/2023 0322 04/28/2023 1907 Full Code [id number]  Mansy, Madison LABOR, MD ED   03/29/2023 1955 03/31/2023 2043 Full Code [id number]  Tobie Mario GAILS, MD ED   09/10/2022 1318 09/16/2022 1911 Full Code [id number]  Celinda Alm Lot, MD ED   08/22/2022 1541 08/23/2022 2011 Full Code [id number]  Cox, Amy N, DO ED   06/13/2022 0404 06/15/2022 1820 Full Code [id number]  Cleatus Delayne GAILS, MD ED   03/03/2022 0637 03/07/2022 1523 Full Code [id number]  Debby Camila LABOR, MD ED   03/28/2017 0418 03/28/2017 2122 Full Code [id number]  Lonzo Pollen, MD Inpatient      Advance Directive Documentation    Flowsheet Row  Most Recent Value  Type of Advance Directive Out of facility DNR (pink MOST or yellow form)  Pre-existing out of facility DNR order (yellow form or pink MOST form) Pink MOST form placed in chart (order not valid for inpatient use)  MOST Form in Place? --    Prognosis: Poor  10:00-12:00 1:00-2:00 Camelia Lewis, NP  Please contact Palliative Medicine Team phone at [phone] for questions and concerns.

## 2024-07-14 DIAGNOSIS — A4151 Sepsis due to Escherichia coli [E. coli]: Secondary | ICD-10-CM | POA: Diagnosis not present

## 2024-07-14 DIAGNOSIS — R627 Adult failure to thrive: Secondary | ICD-10-CM

## 2024-07-14 DIAGNOSIS — Z7189 Other specified counseling: Secondary | ICD-10-CM | POA: Diagnosis not present

## 2024-07-14 DIAGNOSIS — R6521 Severe sepsis with septic shock: Secondary | ICD-10-CM | POA: Diagnosis not present

## 2024-07-14 DIAGNOSIS — N170 Acute kidney failure with tubular necrosis: Secondary | ICD-10-CM | POA: Diagnosis not present

## 2024-07-14 DIAGNOSIS — Z515 Encounter for palliative care: Secondary | ICD-10-CM | POA: Diagnosis not present

## 2024-07-14 DIAGNOSIS — A419 Sepsis, unspecified organism: Secondary | ICD-10-CM | POA: Diagnosis not present

## 2024-07-14 DIAGNOSIS — Z66 Do not resuscitate: Secondary | ICD-10-CM | POA: Diagnosis not present

## 2024-07-14 LAB — BASIC METABOLIC PANEL WITH GFR
Anion gap: 8 (ref 5–15)
BUN: 60 mg/dL — ABNORMAL HIGH (ref 8–23)
CO2: 25 mmol/L (ref 22–32)
Calcium: 7.8 mg/dL — ABNORMAL LOW (ref 8.9–10.3)
Chloride: 114 mmol/L — ABNORMAL HIGH (ref 98–111)
Creatinine, Ser: 0.82 mg/dL (ref 0.44–1.00)
GFR, Estimated: >60 mL/min (ref >60)
Glucose, Bld: 304 mg/dL — ABNORMAL HIGH (ref 70–99)
Potassium: 3.7 mmol/L (ref 3.5–5.1)
Sodium: 147 mmol/L — ABNORMAL HIGH (ref 135–145)

## 2024-07-14 LAB — CBC
HCT: 32.3 % — ABNORMAL LOW (ref 36.0–46.0)
Hemoglobin: 10.1 g/dL — ABNORMAL LOW (ref 12.0–15.0)
MCH: 29.2 pg (ref 26.0–34.0)
MCHC: 31.3 g/dL (ref 30.0–36.0)
MCV: 93.4 fL (ref 80.0–100.0)
Platelets: 164 K/uL (ref 150–400)
RBC: 3.46 MIL/uL — ABNORMAL LOW (ref 3.87–5.11)
RDW: 16.4 % — ABNORMAL HIGH (ref 11.5–15.5)
WBC: 17.4 K/uL — ABNORMAL HIGH (ref 4.0–10.5)
nRBC: 0.7 % — ABNORMAL HIGH (ref 0.0–0.2)

## 2024-07-14 LAB — PHOSPHORUS: Phosphorus: 2.4 mg/dL — ABNORMAL LOW (ref 2.5–4.6)

## 2024-07-14 LAB — GLUCOSE, CAPILLARY
Glucose-Capillary: 123 mg/dL — ABNORMAL HIGH (ref 70–99)
Glucose-Capillary: 131 mg/dL — ABNORMAL HIGH (ref 70–99)
Glucose-Capillary: 170 mg/dL — ABNORMAL HIGH (ref 70–99)
Glucose-Capillary: 195 mg/dL — ABNORMAL HIGH (ref 70–99)
Glucose-Capillary: 203 mg/dL — ABNORMAL HIGH (ref 70–99)
Glucose-Capillary: 282 mg/dL — ABNORMAL HIGH (ref 70–99)

## 2024-07-14 LAB — MAGNESIUM: Magnesium: 2.2 mg/dL (ref 1.7–2.4)

## 2024-07-14 MED ORDER — DEXAMETHASONE 6 MG PO TABS
6.0000 mg | ORAL_TABLET | Freq: Every day | ORAL | Status: DC
  Administered 2024-07-15 – 2024-07-20 (×6): 6 mg
  Filled 2024-07-14 (×6): qty 1

## 2024-07-14 MED ORDER — FUROSEMIDE 10 MG/ML IJ SOLN
40.0000 mg | Freq: Once | INTRAMUSCULAR | Status: AC
  Administered 2024-07-14: 40 mg via INTRAVENOUS
  Filled 2024-07-14: qty 4

## 2024-07-14 MED ORDER — ENOXAPARIN SODIUM 60 MG/0.6ML IJ SOSY: 50.0000 mg | PREFILLED_SYRINGE | INTRAMUSCULAR | Status: DC

## 2024-07-14 MED ORDER — SODIUM CHLORIDE 0.9 % IV BOLUS: 250.0000 mL | Freq: Once | INTRAVENOUS | Status: DC

## 2024-07-14 MED ORDER — APIXABAN 5 MG PO TABS
5.0000 mg | ORAL_TABLET | Freq: Two times a day (BID) | ORAL | Status: DC
Start: 2024-07-14 — End: 2024-07-18
  Administered 2024-07-14 – 2024-07-17 (×7): 5 mg
  Filled 2024-07-14 (×7): qty 1

## 2024-07-14 NOTE — Inpatient Diabetes Management (Signed)
 Inpatient Diabetes Program Recommendations  AACE/ADA: New Consensus Statement on Inpatient Glycemic Control (2015)  Target Ranges:  Prepandial:   less than 140 mg/dL      Peak postprandial:   less than 180 mg/dL (1-2 hours)      Critically ill patients:  140 - 180 mg/dL    Latest Reference Range & Units 07/13/24 00:00 07/13/24 03:46 07/13/24 07:59 07/13/24 11:35 07/13/24 15:53 07/13/24 19:44  Glucose-Capillary 70 - 99 mg/dL 824 (H)  6 units Novolog  117 (H)  3 units Novolog  83  Held Tube Feed Coverage 127 (H)  Held Tube Feed Coverage 221 (H)  8 units Novolog  250 (H)  8 units Novolog   (H): Data is abnormally high  Latest Reference Range & Units 07/13/24 23:04 07/14/24 03:36 07/14/24 07:36 07/14/24 11:37  Glucose-Capillary 70 - 99 mg/dL 661 (H)  14 units Novolog  282 (H)  11 units Novolog  203 (H)  8 units Novolog  195 (H)  6 units Novolog   (H): Data is abnormally high     Home DM Meds: Jardiance  10 mg daily     Metformin 1000 mg daily  Current Orders: Novolog Moderate Correction Scale/ SSI (0-15 units) Q4H     Novolog 3 units Q4H (TF coverage)   MD- Note pt Getting Solucortef 100 mg BID--TF running 65cc/hr  CBGs on the rise since last PM  Please consider Increasing the Novolog Tube Feed Coverage to 5 units Q4 hours    --Will follow patient during hospitalization--  Adina Rudolpho Arrow RN, MSN, CDCES Diabetes Coordinator Inpatient Glycemic Control Team Team Pager: [phone] (8a-5p)
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
## 2024-07-14 NOTE — Plan of Care (Signed)
 ?  Problem: Clinical Measurements: ?Goal: Ability to maintain clinical measurements within normal limits will improve ?Outcome: Progressing ?Goal: Will remain free from infection ?Outcome: Progressing ?Goal: Diagnostic test results will improve ?Outcome: Progressing ?  ?
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
## 2024-07-14 NOTE — Progress Notes (Signed)
 Heart Failure Navigator Progress Note Assessed for Heart & Vascular TOC clinic readiness. Unfortunately, patient does not meet criteria due to hospitalist note for Failure to Thrive and Palliative Care Consult.   Navigator available for reassessment of patient and will sign off at this time.  Charmaine Pines, RN, BSN [facility] Heart Failure Navigator Secure Chat Only
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
## 2024-07-14 NOTE — Progress Notes (Signed)
 MD notified of urine output for 12 hrs. At this time, no additional orders given.

## 2024-07-14 NOTE — Progress Notes (Signed)
 Spoke with TOC regarding daughter's  concerns of care provided at [facility] prior to arrival to this admission. Hotline called, but unable to speak with DSS rep regarding case. TOC SW Marcus advises she will follow up.  Case reviewed with PMT Dr. Marvine

## 2024-07-15 DIAGNOSIS — Z515 Encounter for palliative care: Secondary | ICD-10-CM | POA: Diagnosis not present

## 2024-07-15 DIAGNOSIS — A4151 Sepsis due to Escherichia coli [E. coli]: Secondary | ICD-10-CM | POA: Diagnosis not present

## 2024-07-15 DIAGNOSIS — R6521 Severe sepsis with septic shock: Secondary | ICD-10-CM | POA: Diagnosis not present

## 2024-07-15 DIAGNOSIS — N170 Acute kidney failure with tubular necrosis: Secondary | ICD-10-CM | POA: Diagnosis not present

## 2024-07-15 DIAGNOSIS — B377 Candidal sepsis: Secondary | ICD-10-CM

## 2024-07-15 DIAGNOSIS — R627 Adult failure to thrive: Secondary | ICD-10-CM | POA: Diagnosis not present

## 2024-07-15 DIAGNOSIS — A419 Sepsis, unspecified organism: Secondary | ICD-10-CM | POA: Diagnosis not present

## 2024-07-15 LAB — BLOOD CULTURE ID PANEL (REFLEXED) - BCID2
A.calcoaceticus-baumannii: NOT DETECTED
Bacteroides fragilis: NOT DETECTED
Candida albicans: DETECTED — AB
Candida auris: NOT DETECTED
Candida glabrata: NOT DETECTED
Candida krusei: NOT DETECTED
Candida parapsilosis: NOT DETECTED
Candida tropicalis: NOT DETECTED
Cryptococcus neoformans/gattii: NOT DETECTED
Enterobacter cloacae complex: NOT DETECTED
Enterobacterales: NOT DETECTED
Enterococcus Faecium: NOT DETECTED
Enterococcus faecalis: NOT DETECTED
Escherichia coli: NOT DETECTED
Haemophilus influenzae: NOT DETECTED
Klebsiella aerogenes: NOT DETECTED
Klebsiella oxytoca: NOT DETECTED
Klebsiella pneumoniae: NOT DETECTED
Listeria monocytogenes: NOT DETECTED
Neisseria meningitidis: NOT DETECTED
Proteus species: NOT DETECTED
Pseudomonas aeruginosa: NOT DETECTED
Salmonella species: NOT DETECTED
Serratia marcescens: NOT DETECTED
Staphylococcus aureus (BCID): NOT DETECTED
Staphylococcus epidermidis: NOT DETECTED
Staphylococcus lugdunensis: NOT DETECTED
Staphylococcus species: NOT DETECTED
Stenotrophomonas maltophilia: NOT DETECTED
Streptococcus agalactiae: NOT DETECTED
Streptococcus pneumoniae: NOT DETECTED
Streptococcus pyogenes: NOT DETECTED
Streptococcus species: NOT DETECTED

## 2024-07-15 LAB — CBC
HCT: 31.7 % — ABNORMAL LOW (ref 36.0–46.0)
Hemoglobin: 9.7 g/dL — ABNORMAL LOW (ref 12.0–15.0)
MCH: 28.4 pg (ref 26.0–34.0)
MCHC: 30.6 g/dL (ref 30.0–36.0)
MCV: 93 fL (ref 80.0–100.0)
Platelets: 156 K/uL (ref 150–400)
RBC: 3.41 MIL/uL — ABNORMAL LOW (ref 3.87–5.11)
RDW: 16.2 % — ABNORMAL HIGH (ref 11.5–15.5)
WBC: 16.1 K/uL — ABNORMAL HIGH (ref 4.0–10.5)
nRBC: 0.9 % — ABNORMAL HIGH (ref 0.0–0.2)

## 2024-07-15 LAB — BASIC METABOLIC PANEL WITH GFR
Anion gap: 9 (ref 5–15)
BUN: 69 mg/dL — ABNORMAL HIGH (ref 8–23)
CO2: 26 mmol/L (ref 22–32)
Calcium: 7.6 mg/dL — ABNORMAL LOW (ref 8.9–10.3)
Chloride: 112 mmol/L — ABNORMAL HIGH (ref 98–111)
Creatinine, Ser: 0.71 mg/dL (ref 0.44–1.00)
GFR, Estimated: >60 mL/min (ref >60)
Glucose, Bld: 209 mg/dL — ABNORMAL HIGH (ref 70–99)
Potassium: 3.8 mmol/L (ref 3.5–5.1)
Sodium: 147 mmol/L — ABNORMAL HIGH (ref 135–145)

## 2024-07-15 LAB — GLUCOSE, CAPILLARY
Glucose-Capillary: 127 mg/dL — ABNORMAL HIGH (ref 70–99)
Glucose-Capillary: 91 mg/dL (ref 70–99)
Glucose-Capillary: 119 mg/dL — ABNORMAL HIGH (ref 70–99)
Glucose-Capillary: 147 mg/dL — ABNORMAL HIGH (ref 70–99)
Glucose-Capillary: 276 mg/dL — ABNORMAL HIGH (ref 70–99)
Glucose-Capillary: 211 mg/dL — ABNORMAL HIGH (ref 70–99)

## 2024-07-15 LAB — MAGNESIUM: Magnesium: 2.2 mg/dL (ref 1.7–2.4)

## 2024-07-15 LAB — PHOSPHORUS: Phosphorus: 2.5 mg/dL (ref 2.5–4.6)

## 2024-07-15 MED ORDER — FLUCONAZOLE IN SODIUM CHLORIDE 400-0.9 MG/200ML-% IV SOLN
400.0000 mg | INTRAVENOUS | Status: DC
  Administered 2024-07-16 – 2024-07-20 (×5): 400 mg via INTRAVENOUS
  Filled 2024-07-15 (×6): qty 200

## 2024-07-15 MED ORDER — FUROSEMIDE 10 MG/ML IJ SOLN
40.0000 mg | Freq: Four times a day (QID) | INTRAMUSCULAR | Status: AC
  Administered 2024-07-15 – 2024-07-18 (×11): 40 mg via INTRAVENOUS
  Filled 2024-07-15 (×11): qty 4

## 2024-07-15 MED ORDER — FLUCONAZOLE IN SODIUM CHLORIDE 400-0.9 MG/200ML-% IV SOLN
800.0000 mg | Freq: Once | INTRAVENOUS | Status: AC
  Administered 2024-07-15: 800 mg via INTRAVENOUS
  Filled 2024-07-15: qty 400

## 2024-07-15 MED ORDER — FUROSEMIDE 10 MG/ML IJ SOLN
40.0000 mg | Freq: Two times a day (BID) | INTRAMUSCULAR | Status: DC
Start: 2024-07-15 — End: 2024-07-15
  Administered 2024-07-15 (×2): 40 mg via INTRAVENOUS
  Filled 2024-07-15 (×2): qty 4

## 2024-07-15 NOTE — Progress Notes (Addendum)
 0753 AM: MD has been notified: FYI, the patient had some soft BP's when resting this morning 73/51 (MAP 60) and 78/61 (MAP 91). The patient was stimulated and her BP came up to 98/83, Pulse 97. Manual BP 102/70, Monitor BP is currently 98/59 (MAP 70), P 82. Lasix  to be given per MD for fluid overload and metoprolol  has been discontinued by the provider.  1700 PM.  The patient has been more verbal this morning and was able to talk to his son and daughter over the phone.The patient was able to respond 2 questions asked by her children. Blood cultures have been obtained. Foley care was completed. The patient had a bath and wound care was completed. Per MD IV team consult has been placed for peripheral IV. If a peripheral IV is obtained MD would like femoral central line to be removed.   1855 PM: MD notified, the IV team was not able to obtain an IV. It was reported she would benefit from a new central line if she needs access.

## 2024-07-15 NOTE — Progress Notes (Signed)
 PHARMACY - PHYSICIAN COMMUNICATION CRITICAL VALUE ALERT - BLOOD CULTURE IDENTIFICATION (BCID)  [name] is an 78 y.o. female who presented to [facility] on 07/03/2024 with a chief complaint of sepsis.   Assessment:  Candida albicans in 1 of 4 bottles (aerobic) (include suspected source if known)  Name of physician (or Provider) Contacted: [place]  Current antibiotics: Meropenem   Changes to prescribed antibiotics recommended:  Recommendations accepted by provider  - Will start Fluconazole 800 mg IV X 1 on 11/7 @ ~ 0600 followed by fluconazole 400 mg IV Q24H on 11/8.   Results for orders placed or performed during the hospital encounter of 07/03/24  Blood Culture ID Panel (Reflexed) (Collected: 07/13/2024 12:45 PM)  Result Value Ref Range   Enterococcus faecalis NOT DETECTED NOT DETECTED   Enterococcus Faecium NOT DETECTED NOT DETECTED   Listeria monocytogenes NOT DETECTED NOT DETECTED   Staphylococcus species NOT DETECTED NOT DETECTED   Staphylococcus aureus (BCID) NOT DETECTED NOT DETECTED   Staphylococcus epidermidis NOT DETECTED NOT DETECTED   Staphylococcus lugdunensis NOT DETECTED NOT DETECTED   Streptococcus species NOT DETECTED NOT DETECTED   Streptococcus agalactiae NOT DETECTED NOT DETECTED   Streptococcus pneumoniae NOT DETECTED NOT DETECTED   Streptococcus pyogenes NOT DETECTED NOT DETECTED   A.calcoaceticus-baumannii NOT DETECTED NOT DETECTED   Bacteroides fragilis NOT DETECTED NOT DETECTED   Enterobacterales NOT DETECTED NOT DETECTED   Enterobacter cloacae complex NOT DETECTED NOT DETECTED   Escherichia coli NOT DETECTED NOT DETECTED   Klebsiella aerogenes NOT DETECTED NOT DETECTED   Klebsiella oxytoca NOT DETECTED NOT DETECTED   Klebsiella pneumoniae NOT DETECTED NOT DETECTED   Proteus species NOT DETECTED NOT DETECTED   Salmonella species NOT DETECTED NOT DETECTED   Serratia marcescens NOT DETECTED NOT DETECTED   Haemophilus influenzae NOT DETECTED NOT  DETECTED   Neisseria meningitidis NOT DETECTED NOT DETECTED   Pseudomonas aeruginosa NOT DETECTED NOT DETECTED   Stenotrophomonas maltophilia NOT DETECTED NOT DETECTED   Candida albicans DETECTED (A) NOT DETECTED   Candida auris NOT DETECTED NOT DETECTED   Candida glabrata NOT DETECTED NOT DETECTED   Candida krusei NOT DETECTED NOT DETECTED   Candida parapsilosis NOT DETECTED NOT DETECTED   Candida tropicalis NOT DETECTED NOT DETECTED   Cryptococcus neoformans/gattii NOT DETECTED NOT DETECTED    [name] D 07/15/2024  4:53 AM

## 2024-07-15 NOTE — Consult Note (Signed)
 Infectious Disease     Reason for Consult:Candidemia   Referring Physician: Dr Awanda Date of Admission:  07/03/2024   Principal Problem:   Sepsis [facility]) Active Problems:   Lower urinary tract infectious disease   HPI: [name] is a 78 y.o. female  with a complicated medical history initially hospitalized October 26 from [facility] with altered mental status and hypotension.  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  She has been on meropenem for these since October 26 for planned 14-day course.. She had a routine fu BCX to document clearance of the bacteremia on  November 5 and it is + for  Candida albicans identified.  She was started on fluconazole November 6.  White blood count is decreased from 17-16.  She has had no fevers. She has been seen by palliative care for failure to thrive and is considering PEG tube. She has had progressive declines including anasarca, with massive edema and wounds. She has a L TLC in her L femoral artery.   Past Medical History:  Diagnosis Date   Abnormality of gait    Acute on chronic diastolic CHF (congestive heart failure) (HCC) 11/20/2017   Acute respiratory failure with hypoxia (HCC) 06/13/2022   AKI (acute kidney injury) 03/03/2022   Breast cancer (HCC) 02/05/2005   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    Breast cancer (HCC) 2019   right breast   Chronic kidney disease, stage 3b (HCC) 09/10/2022   Complication of anesthesia 2006   pt states hard to wake up after L breast surgery    DVT (deep venous thrombosis) (HCC)    a. 03/2023 R PT Vein DVT following abd surgery-->s/p IVC filter.   Edema    GERD (gastroesophageal reflux disease)    Glucose intolerance 09/10/2022   Headache    Heart failure with improved ejection fraction (HFimpEF) (HCC)    a. 07/2011 Echo: EF 20-25%; b. 03/2017 Echo: EF  50-55%; c. 02/2022 Echo: EF 50-55%. mild LVH, GrI DD, Nl RV fxn, mild BAE, mild MR, mild AoV sclerosis, mild AI.   Heart murmur    Hypertension    Hypertensive emergency 06/13/2022   Lower limb ulcer, calf, left, limited to breakdown of skin (HCC) 12/11/2020   Malignant neoplasm of breast (female), unspecified site 06/14/2018   RIGHT T1b, N0; ER/PR positive, HER-2 negative.   Neuropathy    Obesity    Pain in joint, site unspecified    Personal history of chemotherapy 2006   left breast   Personal history of radiation therapy 2019   right breast   Personal history of radiation therapy 2006   left breast   Pneumonia    Pyuria 08/22/2022   Sleep apnea    Syncope 03/03/2022   Tinea pedis    Unspecified hearing loss    bilateral   Weakness 03/03/2022   Past Surgical History:  Procedure Laterality Date   BREAST BIOPSY Right 2019   11 oc-INVASIVE MAMMARY CARCINOMA, NO SPECIAL TYPE   BREAST BIOPSY Right 2019   1030 oc- neg   BREAST LUMPECTOMY  2006   left breast    CATARACT EXTRACTION     left eye   HERNIA REPAIR  05/25/2006   Incarcerated omentum in ventral hernia, Ventralight mesh, combined lap/ open with omental resection.    IVC FILTER INSERTION N/A 03/30/2023   Procedure: IVC FILTER INSERTION;  Surgeon:  Marea Selinda RAMAN, MD;  Location: [facility] INVASIVE CV LAB;  Service: Cardiovascular;  Laterality: N/A;   LAPAROTOMY N/A 04/20/2023   Procedure: EXPLORATORY LAPAROTOMY, LYSIS OF ADHESIONS WITH  MESSENTARIC BIOPSY AND REMOVAL OF MESH WITH METALLIC TACKS;  Surgeon: Rodolph Romano, MD;  Location: [facility] ORS;  Service: General;  Laterality: N/A;   PARTIAL MASTECTOMY WITH NEEDLE LOCALIZATION Right 06/14/2018   Procedure: PARTIAL MASTECTOMY WITH NEEDLE LOCALIZATION;  Surgeon: Dessa Reyes ORN, MD;  Location: [facility] ORS;  Service: General;  Laterality: Right;   PORT-A-CATH REMOVAL  05/25/2006   SENTINEL NODE BIOPSY Right 06/14/2018   Procedure: SENTINEL NODE BIOPSY;  Surgeon: Dessa Reyes ORN, MD;  Location: [facility] ORS;  Service: General;  Laterality: Right;   TUBAL LIGATION     Social History   Tobacco Use   Smoking status: Former    Current packs/day: 0.00    Average packs/day: 0.5 packs/day for 30.0 years (15.0 ttl pk-yrs)    Types: Cigarettes    Start date: 07/14/1970    Quit date: 07/14/2000    Years since quitting: 24.0   Smokeless tobacco: Never  Vaping Use   Vaping status: Never Used  Substance Use Topics   Alcohol use: No   Drug use: Not Currently    Types: Marijuana    Comment: endorses prior marijuana use 1999   Family History  Problem Relation Age of Onset   Hypertension Mother        deceased 61   Cataracts Mother    Breast cancer Other 59       maternal half-sister; deceased 55   Coronary artery disease Other     Allergies: No Known Allergies  Current antibiotics: Antibiotics Given (last 72 hours)     Date/Time Action Medication Dose Rate   07/12/24 1427 New Bag/Given   meropenem (MERREM) 1 g in sodium chloride  0.9 % 100 mL IVPB 1 g 200 mL/hr   07/12/24 2116 New Bag/Given   meropenem (MERREM) 1 g in sodium chloride  0.9 % 100 mL IVPB 1 g 200 mL/hr   07/13/24 0513 New Bag/Given   meropenem (MERREM) 1 g in sodium chloride  0.9 % 100 mL IVPB 1 g 200 mL/hr   07/13/24 1430 New Bag/Given   meropenem (MERREM) 1 g in sodium chloride  0.9 % 100 mL IVPB 1 g 200 mL/hr   07/13/24 2148 New Bag/Given   meropenem (MERREM) 1 g in sodium chloride  0.9 % 100 mL IVPB 1 g 200 mL/hr   07/14/24 0508 New Bag/Given   meropenem (MERREM) 1 g in sodium chloride  0.9 % 100 mL IVPB 1 g 200 mL/hr   07/14/24 1434 New Bag/Given   meropenem (MERREM) 1 g in sodium chloride  0.9 % 100 mL IVPB 1 g 200 mL/hr   07/14/24 2234 New Bag/Given   meropenem (MERREM) 1 g in sodium chloride  0.9 % 100 mL IVPB 1 g 200 mL/hr   07/15/24 0516 New Bag/Given   meropenem (MERREM) 1 g in sodium chloride  0.9 % 100 mL IVPB 1 g 200 mL/hr   07/15/24 1324 New Bag/Given   meropenem (MERREM) 1 g  in sodium chloride  0.9 % 100 mL IVPB 1 g 200 mL/hr       MEDICATIONS:  apixaban   5 mg Per Tube BID   vitamin C   500 mg Per Tube BID   Chlorhexidine  Gluconate Cloth  6 each Topical Daily   dexamethasone   6 mg Per Tube Daily   feeding supplement (PROSource TF20)  60 mL Per Tube  Daily   free water  200 mL Per Tube Q2H   furosemide   40 mg Intravenous BID   Gerhardt's butt cream   Topical BID   insulin  aspart  0-15 Units Subcutaneous Q4H   insulin  aspart  3 Units Subcutaneous Q4H   mouth rinse  15 mL Mouth Rinse 4 times per day   pantoprazole  (PROTONIX ) IV  40 mg Intravenous Q24H   senna-docusate  1 tablet Per NG tube QHS   thiamine  100 mg Per Tube Daily    Review of Systems - unable to obtain   OBJECTIVE: Temp:  [97.5 F (36.4 C)-98.6 F (37 C)] 98.4 F (36.9 C) (11/07 1200) Pulse Rate:  [44-109] 109 (11/07 1200) Resp:  [7-21] 8 (11/07 1300) BP: (46-128)/(20-89) 110/71 (11/07 1300) SpO2:  [92 %-100 %] 92 % (11/07 1200) Weight:  [108.2 kg] 108.2 kg (11/07 0500) Physical Exam  Constitutional: chronically ill appearing. Min responsive. anasarca HENT: [place]/AT,  Mouth/Throat: MM dry Cardiovascular: distant HS Pulmonary/Chest: poor air movement  Neck = supple, no nuchal rigidity Abdominal: Soft. Bowel sounds are normal.  exhibits no distension. There is no tenderness.  Lymphadenopathy: no cervical adenopathy. No axillary adenopathy Neurological: min responsive L groin TLC  in place Ext anasarca with pitting edema Skin:             LABS: Results for orders placed or performed during the hospital encounter of 07/03/24 (from the past 48 hours)  Glucose, capillary     Status: Abnormal   Collection Time: 07/13/24  3:53 PM  Result Value Ref Range   Glucose-Capillary 221 (H) 70 - 99 mg/dL    Comment: Glucose reference range applies only to samples taken after fasting for at least 8 hours.  Glucose, capillary     Status: Abnormal   Collection Time: 07/13/24  7:44 PM   Result Value Ref Range   Glucose-Capillary 250 (H) 70 - 99 mg/dL    Comment: Glucose reference range applies only to samples taken after fasting for at least 8 hours.  Glucose, capillary     Status: Abnormal   Collection Time: 07/13/24 11:04 PM  Result Value Ref Range   Glucose-Capillary 338 (H) 70 - 99 mg/dL    Comment: Glucose reference range applies only to samples taken after fasting for at least 8 hours.  Glucose, capillary     Status: Abnormal   Collection Time: 07/14/24  3:36 AM  Result Value Ref Range   Glucose-Capillary 282 (H) 70 - 99 mg/dL    Comment: Glucose reference range applies only to samples taken after fasting for at least 8 hours.  Magnesium     Status: None   Collection Time: 07/14/24  5:25 AM  Result Value Ref Range   Magnesium 2.2 1.7 - 2.4 mg/dL    Comment: Performed at [facility], [address] [place], [place] [address]  Phosphorus     Status: Abnormal   Collection Time: 07/14/24  5:25 AM  Result Value Ref Range   Phosphorus 2.4 (L) 2.5 - 4.6 mg/dL    Comment: Performed at [facility], [address], [place], [place] [address]  Basic metabolic panel     Status: Abnormal   Collection Time: 07/14/24  5:25 AM  Result Value Ref Range   Sodium 147 (H) 135 - 145 mmol/L   Potassium 3.7 3.5 - 5.1 mmol/L   Chloride 114 (H) 98 - 111 mmol/L   CO2 25 22 - 32 mmol/L   Glucose, Bld  304 (H) 70 - 99 mg/dL    Comment: Glucose reference range applies only to samples taken after fasting for at least 8 hours.   BUN 60 (H) 8 - 23 mg/dL   Creatinine, Ser 9.17 0.44 - 1.00 mg/dL   Calcium  7.8 (L) 8.9 - 10.3 mg/dL   GFR, Estimated >39 >39 mL/min    Comment: (NOTE) Calculated using the CKD-EPI Creatinine Equation (2021)    Anion gap 8 5 - 15    Comment: Performed at [facility], [address] [place], [place] [address]  CBC     Status: Abnormal   Collection Time: 07/14/24  5:25 AM  Result Value Ref Range   WBC 17.4 (H) 4.0 - 10.5  K/uL   RBC 3.46 (L) 3.87 - 5.11 MIL/uL   Hemoglobin 10.1 (L) 12.0 - 15.0 g/dL   HCT 67.6 (L) 63.9 - 53.9 %   MCV 93.4 80.0 - 100.0 fL   MCH 29.2 26.0 - 34.0 pg   MCHC 31.3 30.0 - 36.0 g/dL   RDW 83.5 (H) 88.4 - 84.4 %   Platelets 164 150 - 400 K/uL   nRBC 0.7 (H) 0.0 - 0.2 %    Comment: Performed at [facility], [address] [place], [place] [address]  Glucose, capillary     Status: Abnormal   Collection Time: 07/14/24  7:36 AM  Result Value Ref Range   Glucose-Capillary 203 (H) 70 - 99 mg/dL    Comment: Glucose reference range applies only to samples taken after fasting for at least 8 hours.  Glucose, capillary     Status: Abnormal   Collection Time: 07/14/24 11:37 AM  Result Value Ref Range   Glucose-Capillary 195 (H) 70 - 99 mg/dL    Comment: Glucose reference range applies only to samples taken after fasting for at least 8 hours.  Glucose, capillary     Status: Abnormal   Collection Time: 07/14/24  3:22 PM  Result Value Ref Range   Glucose-Capillary 170 (H) 70 - 99 mg/dL    Comment: Glucose reference range applies only to samples taken after fasting for at least 8 hours.  Glucose, capillary     Status: Abnormal   Collection Time: 07/14/24  7:52 PM  Result Value Ref Range   Glucose-Capillary 123 (H) 70 - 99 mg/dL    Comment: Glucose reference range applies only to samples taken after fasting for at least 8 hours.  Glucose, capillary     Status: Abnormal   Collection Time: 07/14/24 11:33 PM  Result Value Ref Range   Glucose-Capillary 131 (H) 70 - 99 mg/dL    Comment: Glucose reference range applies only to samples taken after fasting for at least 8 hours.  Glucose, capillary     Status: Abnormal   Collection Time: 07/15/24  3:16 AM  Result Value Ref Range   Glucose-Capillary 127 (H) 70 - 99 mg/dL    Comment: Glucose reference range applies only to samples taken after fasting for at least 8 hours.  Basic metabolic panel with GFR     Status: Abnormal    Collection Time: 07/15/24  5:10 AM  Result Value Ref Range   Sodium 147 (H) 135 - 145 mmol/L   Potassium 3.8 3.5 - 5.1 mmol/L   Chloride 112 (H) 98 - 111 mmol/L   CO2 26 22 - 32 mmol/L   Glucose, Bld 209 (H) 70 - 99 mg/dL    Comment: Glucose reference range applies only to samples taken  after fasting for at least 8 hours.   BUN 69 (H) 8 - 23 mg/dL   Creatinine, Ser 9.28 0.44 - 1.00 mg/dL   Calcium  7.6 (L) 8.9 - 10.3 mg/dL   GFR, Estimated >39 >39 mL/min    Comment: (NOTE) Calculated using the CKD-EPI Creatinine Equation (2021)    Anion gap 9 5 - 15    Comment: Performed at [facility], [address] [place], [place] [address]  Phosphorus     Status: None   Collection Time: 07/15/24  5:10 AM  Result Value Ref Range   Phosphorus 2.5 2.5 - 4.6 mg/dL    Comment: Performed at [facility], [address] [place], [place] [address]  Magnesium     Status: None   Collection Time: 07/15/24  5:10 AM  Result Value Ref Range   Magnesium 2.2 1.7 - 2.4 mg/dL    Comment: Performed at [facility], [address] [place], [place] [address]  Glucose, capillary     Status: None   Collection Time: 07/15/24  7:35 AM  Result Value Ref Range   Glucose-Capillary 91 70 - 99 mg/dL    Comment: Glucose reference range applies only to samples taken after fasting for at least 8 hours.  CBC     Status: Abnormal   Collection Time: 07/15/24  9:18 AM  Result Value Ref Range   WBC 16.1 (H) 4.0 - 10.5 K/uL   RBC 3.41 (L) 3.87 - 5.11 MIL/uL   Hemoglobin 9.7 (L) 12.0 - 15.0 g/dL   HCT 68.2 (L) 63.9 - 53.9 %   MCV 93.0 80.0 - 100.0 fL   MCH 28.4 26.0 - 34.0 pg   MCHC 30.6 30.0 - 36.0 g/dL   RDW 83.7 (H) 88.4 - 84.4 %   Platelets 156 150 - 400 K/uL   nRBC 0.9 (H) 0.0 - 0.2 %    Comment: Performed at [facility], [address] [place], [place] [address]  Glucose, capillary     Status: Abnormal   Collection Time: 07/15/24 11:20 AM  Result Value Ref Range    Glucose-Capillary 119 (H) 70 - 99 mg/dL    Comment: Glucose reference range applies only to samples taken after fasting for at least 8 hours.   No components found for: ESR, C REACTIVE PROTEIN MICRO: Recent Results (from the past 720 hours)  Resp panel by RT-PCR (RSV, Flu A&B, Covid) Anterior Nasal Swab     Status: None   Collection Time: 07/03/24 10:45 AM   Specimen: Anterior Nasal Swab  Result Value Ref Range Status   SARS Coronavirus 2 by RT PCR NEGATIVE NEGATIVE Final    Comment: (NOTE) SARS-CoV-2 target nucleic acids are NOT DETECTED.  The SARS-CoV-2 RNA is generally detectable in upper respiratory specimens during the acute phase of infection. The lowest concentration of SARS-CoV-2 viral copies this assay can detect is 138 copies/mL. A negative result does not preclude SARS-Cov-2 infection and should not be used as the sole basis for treatment or other patient management decisions. A negative result may occur with  improper specimen collection/handling, submission of specimen other than nasopharyngeal swab, presence of viral mutation(s) within the areas targeted by this assay, and inadequate number of viral copies(<138 copies/mL). A negative result must be combined with clinical observations, patient history, and epidemiological information. The expected result is Negative.  Fact Sheet for Patients:  bloggercourse.com  Fact Sheet for Healthcare Providers:  seriousbroker.it  This test is no t yet approved  or cleared by the [place]  FDA and  has been authorized for detection and/or diagnosis of SARS-CoV-2 by FDA under an Emergency Use Authorization (EUA). This EUA will remain  in effect (meaning this test can be used) for the duration of the COVID-19 declaration under Section 564(b)(1) of the Act, 21 U.S.C.section 360bbb-3(b)(1), unless the authorization is terminated  or revoked sooner.       Influenza A by PCR  NEGATIVE NEGATIVE Final   Influenza B by PCR NEGATIVE NEGATIVE Final    Comment: (NOTE) The Xpert Xpress SARS-CoV-2/FLU/RSV plus assay is intended as an aid in the diagnosis of influenza from Nasopharyngeal swab specimens and should not be used as a sole basis for treatment. Nasal washings and aspirates are unacceptable for Xpert Xpress SARS-CoV-2/FLU/RSV testing.  Fact Sheet for Patients: bloggercourse.com  Fact Sheet for Healthcare Providers: seriousbroker.it  This test is not yet approved or cleared by the [place]  FDA and has been authorized for detection and/or diagnosis of SARS-CoV-2 by FDA under an Emergency Use Authorization (EUA). This EUA will remain in effect (meaning this test can be used) for the duration of the COVID-19 declaration under Section 564(b)(1) of the Act, 21 U.S.C. section 360bbb-3(b)(1), unless the authorization is terminated or revoked.     Resp Syncytial Virus by PCR NEGATIVE NEGATIVE Final    Comment: (NOTE) Fact Sheet for Patients: bloggercourse.com  Fact Sheet for Healthcare Providers: seriousbroker.it  This test is not yet approved or cleared by the [place]  FDA and has been authorized for detection and/or diagnosis of SARS-CoV-2 by FDA under an Emergency Use Authorization (EUA). This EUA will remain in effect (meaning this test can be used) for the duration of the COVID-19 declaration under Section 564(b)(1) of the Act, 21 U.S.C. section 360bbb-3(b)(1), unless the authorization is terminated or revoked.  Performed at [facility], [address], [place], [place] [address]   Blood Culture (routine x 2)     Status: Abnormal   Collection Time: 07/03/24 10:45 AM   Specimen: BLOOD  Result Value Ref Range Status   Specimen Description   Final    BLOOD LEFT ANTECUBITAL Performed at [facility] [place] [facility], [address], [place], [place] [address]    Special Requests   Final    BOTTLES DRAWN AEROBIC AND ANAEROBIC Blood Culture adequate volume Performed at [facility], [address], [place], [place] [address]    Culture  Setup Time   Final    GRAM NEGATIVE RODS IN BOTH AEROBIC AND ANAEROBIC BOTTLES Organism ID to follow CRITICAL RESULT CALLED TO, READ BACK BY AND VERIFIED WITH: Will Lenon GOWER.D [id number] 1958 kg Performed at [facility], [address], [place], [place] [address]    Culture (A)  Final    ESCHERICHIA COLI Confirmed Extended Spectrum Beta-Lactamase Producer (ESBL).  In bloodstream infections from ESBL organisms, carbapenems are preferred over piperacillin /tazobactam. They are shown to have a lower risk of mortality.    Report Status 07/06/2024 FINAL  Final   Organism ID, Bacteria ESCHERICHIA COLI  Final      Susceptibility   Escherichia coli - MIC*    AMPICILLIN >=32 RESISTANT Resistant     CEFAZOLIN  (NON-URINE) >=32 RESISTANT Resistant     CEFEPIME  16 RESISTANT Resistant     ERTAPENEM <=0.12 SENSITIVE Sensitive     CEFTRIAXONE  >=64 RESISTANT Resistant     CIPROFLOXACIN  >=4 RESISTANT Resistant     GENTAMICIN <=1 SENSITIVE Sensitive     MEROPENEM <=0.25  SENSITIVE Sensitive     TRIMETH/SULFA >=320 RESISTANT Resistant     AMPICILLIN/SULBACTAM 4 SENSITIVE Sensitive     PIP/TAZO Value in next row Sensitive      <=4 SENSITIVEThis is a modified FDA-approved test that has been validated and its performance characteristics determined by the reporting laboratory.  This laboratory is certified under the Clinical Laboratory Improvement Amendments CLIA as qualified to perform high complexity clinical laboratory testing.    * ESCHERICHIA COLI  Urine Culture     Status: Abnormal   Collection Time: 07/03/24 10:45 AM   Specimen: Urine, Random  Result Value Ref Range Status   Specimen Description   Final    URINE, RANDOM Performed at [facility], [address] [place], [place] [address]    Special Requests   Final    NONE Reflexed from [id number] Performed at [facility], [address] [place], [place] [address]    Culture >=100,000 COLONIES/mL KLEBSIELLA PNEUMONIAE (A)  Final   Report Status 07/05/2024 FINAL  Final   Organism ID, Bacteria KLEBSIELLA PNEUMONIAE (A)  Final      Susceptibility   Klebsiella pneumoniae - MIC*    AMPICILLIN >=32 RESISTANT Resistant     CEFAZOLIN  (URINE) Value in next row Sensitive      4 SENSITIVEThis is a modified FDA-approved test that has been validated and its performance characteristics determined by the reporting laboratory.  This laboratory is certified under the Clinical Laboratory Improvement Amendments CLIA as qualified to perform high complexity clinical laboratory testing.    CEFEPIME  Value in next row Sensitive      4 SENSITIVEThis is a modified FDA-approved test that has been validated and its performance characteristics determined by the reporting laboratory.  This laboratory is certified under the Clinical Laboratory Improvement Amendments CLIA as qualified to perform high complexity clinical laboratory testing.    ERTAPENEM Value in next row Sensitive      4 SENSITIVEThis is a modified FDA-approved test that has been validated and its performance characteristics determined by the reporting laboratory.  This laboratory is certified under the Clinical Laboratory Improvement Amendments CLIA as qualified to perform high complexity clinical laboratory testing.    CEFTRIAXONE  Value in next row Sensitive      4 SENSITIVEThis is a modified FDA-approved test that has been validated and its performance characteristics determined by the reporting laboratory.  This laboratory is certified under the Clinical Laboratory Improvement Amendments CLIA as qualified to perform high complexity clinical laboratory testing.    CIPROFLOXACIN  Value in next row Sensitive      4 SENSITIVEThis is a modified FDA-approved test  that has been validated and its performance characteristics determined by the reporting laboratory.  This laboratory is certified under the Clinical Laboratory Improvement Amendments CLIA as qualified to perform high complexity clinical laboratory testing.    GENTAMICIN Value in next row Sensitive      4 SENSITIVEThis is a modified FDA-approved test that has been validated and its performance characteristics determined by the reporting laboratory.  This laboratory is certified under the Clinical Laboratory Improvement Amendments CLIA as qualified to perform high complexity clinical laboratory testing.    NITROFURANTOIN Value in next row Intermediate      4 SENSITIVEThis is a modified FDA-approved test that has been validated and its performance characteristics determined by the reporting laboratory.  This laboratory is certified under the Clinical Laboratory Improvement Amendments CLIA as qualified to perform high complexity clinical laboratory testing.    TRIMETH/SULFA Value  in next row Sensitive      4 SENSITIVEThis is a modified FDA-approved test that has been validated and its performance characteristics determined by the reporting laboratory.  This laboratory is certified under the Clinical Laboratory Improvement Amendments CLIA as qualified to perform high complexity clinical laboratory testing.    AMPICILLIN/SULBACTAM Value in next row Resistant      4 SENSITIVEThis is a modified FDA-approved test that has been validated and its performance characteristics determined by the reporting laboratory.  This laboratory is certified under the Clinical Laboratory Improvement Amendments CLIA as qualified to perform high complexity clinical laboratory testing.    PIP/TAZO Value in next row Sensitive      8 SENSITIVEThis is a modified FDA-approved test that has been validated and its performance characteristics determined by the reporting laboratory.  This laboratory is certified under the Clinical Laboratory  Improvement Amendments CLIA as qualified to perform high complexity clinical laboratory testing.    MEROPENEM Value in next row Sensitive      8 SENSITIVEThis is a modified FDA-approved test that has been validated and its performance characteristics determined by the reporting laboratory.  This laboratory is certified under the Clinical Laboratory Improvement Amendments CLIA as qualified to perform high complexity clinical laboratory testing.    * >=100,000 COLONIES/mL KLEBSIELLA PNEUMONIAE  Blood Culture (routine x 2)     Status: Abnormal   Collection Time: 07/03/24 10:46 AM   Specimen: BLOOD  Result Value Ref Range Status   Specimen Description   Final    BLOOD RIGHT ANTECUBITAL Performed at [facility], [address], [place], [place] [address]    Special Requests   Final    BOTTLES DRAWN AEROBIC AND ANAEROBIC Blood Culture adequate volume Performed at [facility], [address], [place], [place] [address]    Culture  Setup Time   Final    GRAM NEGATIVE RODS IN BOTH AEROBIC AND ANAEROBIC BOTTLES Organism ID to follow CRITICAL RESULT CALLED TO, READ BACK BY AND VERIFIED WITH: Will Lenon GOWER.D [id number] 1958 kg Performed at [facility], [address] [place], [place] [address]    Culture (A)  Final    ESCHERICHIA COLI SUSCEPTIBILITIES PERFORMED ON PREVIOUS CULTURE WITHIN THE LAST 5 DAYS. Performed at [facility], [address], [place], [place] [address]    Report Status 07/06/2024 FINAL  Final  Blood Culture ID Panel (Reflexed)     Status: Abnormal   Collection Time: 07/03/24 10:46 AM  Result Value Ref Range Status   Enterococcus faecalis NOT DETECTED NOT DETECTED Final   Enterococcus Faecium NOT DETECTED NOT DETECTED Final   Listeria monocytogenes NOT DETECTED NOT DETECTED Final   Staphylococcus species NOT DETECTED NOT DETECTED Final   Staphylococcus aureus (BCID) NOT DETECTED NOT DETECTED Final   Staphylococcus epidermidis NOT  DETECTED NOT DETECTED Final   Staphylococcus lugdunensis NOT DETECTED NOT DETECTED Final   Streptococcus species NOT DETECTED NOT DETECTED Final   Streptococcus agalactiae NOT DETECTED NOT DETECTED Final   Streptococcus pneumoniae NOT DETECTED NOT DETECTED Final   Streptococcus pyogenes NOT DETECTED NOT DETECTED Final   A.calcoaceticus-baumannii NOT DETECTED NOT DETECTED Final   Bacteroides fragilis NOT DETECTED NOT DETECTED Final   Enterobacterales DETECTED (A) NOT DETECTED Final    Comment: Enterobacterales represent a large order of gram negative bacteria, not a single organism. CRITICAL RESULT CALLED TO, READ BACK BY AND VERIFIED WITH: Will Lenon GOWER.D [id number] 1958 kg    Enterobacter cloacae complex NOT DETECTED  NOT DETECTED Final   Escherichia coli DETECTED (A) NOT DETECTED Final    Comment: CRITICAL RESULT CALLED TO, READ BACK BY AND VERIFIED WITH: Will [employer].D [id number] 1958 kg    Klebsiella aerogenes NOT DETECTED NOT DETECTED Final   Klebsiella oxytoca NOT DETECTED NOT DETECTED Final   Klebsiella pneumoniae NOT DETECTED NOT DETECTED Final   Proteus species NOT DETECTED NOT DETECTED Final   Salmonella species NOT DETECTED NOT DETECTED Final   Serratia marcescens NOT DETECTED NOT DETECTED Final   Haemophilus influenzae NOT DETECTED NOT DETECTED Final   Neisseria meningitidis NOT DETECTED NOT DETECTED Final   Pseudomonas aeruginosa NOT DETECTED NOT DETECTED Final   Stenotrophomonas maltophilia NOT DETECTED NOT DETECTED Final   Candida albicans NOT DETECTED NOT DETECTED Final   Candida auris NOT DETECTED NOT DETECTED Final   Candida glabrata NOT DETECTED NOT DETECTED Final   Candida krusei NOT DETECTED NOT DETECTED Final   Candida parapsilosis NOT DETECTED NOT DETECTED Final   Candida tropicalis NOT DETECTED NOT DETECTED Final   Cryptococcus neoformans/gattii NOT DETECTED NOT DETECTED Final   CTX-M ESBL DETECTED (A) NOT DETECTED Final    Comment: CRITICAL RESULT  CALLED TO, READ BACK BY AND VERIFIED WITH: Will [employer].D [id number] 1958 kg (NOTE) Extended spectrum beta-lactamase detected. Recommend a carbapenem as initial therapy.      Carbapenem resistance IMP NOT DETECTED NOT DETECTED Final   Carbapenem resistance KPC NOT DETECTED NOT DETECTED Final   Carbapenem resistance NDM NOT DETECTED NOT DETECTED Final   Carbapenem resist OXA 48 LIKE NOT DETECTED NOT DETECTED Final   Carbapenem resistance VIM NOT DETECTED NOT DETECTED Final    Comment: Performed at [facility], [address] [place], [place] [address]  MRSA Next Gen by PCR, Nasal     Status: Abnormal   Collection Time: 07/03/24  2:19 PM   Specimen: Nasal Mucosa; Nasal Swab  Result Value Ref Range Status   MRSA by PCR Next Gen DETECTED (A) NOT DETECTED Final    Comment: RESULT CALLED TO, READ BACK BY AND VERIFIED WITH: BURNARD FOTHERGILL RN [phone] (NOTE) The GeneXpert MRSA Assay (FDA approved for NASAL specimens only), is one component of a comprehensive MRSA colonization surveillance program. It is not intended to diagnose MRSA infection nor to guide or monitor treatment for MRSA infections. Test performance is not FDA approved in patients less than 72 years old. Performed at [facility], [address] [place], [place] [address]   Respiratory (~20 pathogens) panel by PCR     Status: None   Collection Time: 07/04/24  3:52 AM  Result Value Ref Range Status   Adenovirus NOT DETECTED NOT DETECTED Final   Coronavirus 229E NOT DETECTED NOT DETECTED Final    Comment: (NOTE) The Coronavirus on the Respiratory Panel, DOES NOT test for the novel  Coronavirus (2019 nCoV)    Coronavirus HKU1 NOT DETECTED NOT DETECTED Final   Coronavirus NL63 NOT DETECTED NOT DETECTED Final   Coronavirus OC43 NOT DETECTED NOT DETECTED Final   Metapneumovirus NOT DETECTED NOT DETECTED Final   Rhinovirus / Enterovirus NOT DETECTED NOT DETECTED Final   Influenza A NOT  DETECTED NOT DETECTED Final   Influenza B NOT DETECTED NOT DETECTED Final   Parainfluenza Virus 1 NOT DETECTED NOT DETECTED Final   Parainfluenza Virus 2 NOT DETECTED NOT DETECTED Final   Parainfluenza Virus 3 NOT DETECTED NOT DETECTED Final   Parainfluenza Virus 4 NOT DETECTED NOT DETECTED Final   Respiratory  Syncytial Virus NOT DETECTED NOT DETECTED Final   Bordetella pertussis NOT DETECTED NOT DETECTED Final   Bordetella Parapertussis NOT DETECTED NOT DETECTED Final   Chlamydophila pneumoniae NOT DETECTED NOT DETECTED Final   Mycoplasma pneumoniae NOT DETECTED NOT DETECTED Final    Comment: Performed at [facility], [address] [place], [place] [address]  Culture, blood (Routine X 2) w Reflex to ID Panel     Status: Abnormal (Preliminary result)   Collection Time: 07/13/24 12:45 PM   Specimen: BLOOD  Result Value Ref Range Status   Specimen Description BLOOD LEFT ANTECUBITAL  Final   Special Requests   Final    BOTTLES DRAWN AEROBIC AND ANAEROBIC Blood Culture adequate volume   Culture  Setup Time   Final    Organism ID to follow YEAST AEROBIC BOTTLE ONLY CRITICAL RESULT CALLED TO, READ BACK BY AND VERIFIED WITH: JASON ROBBINS ON 07/15/24 AT 0408 QSD Performed at [facility], [address] [place], [place] [address]    Culture YEAST (A)  Final   Report Status PENDING  Incomplete  Blood Culture ID Panel (Reflexed)     Status: Abnormal   Collection Time: 07/13/24 12:45 PM  Result Value Ref Range Status   Enterococcus faecalis NOT DETECTED NOT DETECTED Final   Enterococcus Faecium NOT DETECTED NOT DETECTED Final   Listeria monocytogenes NOT DETECTED NOT DETECTED Final   Staphylococcus species NOT DETECTED NOT DETECTED Final   Staphylococcus aureus (BCID) NOT DETECTED NOT DETECTED Final   Staphylococcus epidermidis NOT DETECTED NOT DETECTED Final   Staphylococcus lugdunensis NOT DETECTED NOT DETECTED Final   Streptococcus species NOT DETECTED NOT DETECTED  Final   Streptococcus agalactiae NOT DETECTED NOT DETECTED Final   Streptococcus pneumoniae NOT DETECTED NOT DETECTED Final   Streptococcus pyogenes NOT DETECTED NOT DETECTED Final   A.calcoaceticus-baumannii NOT DETECTED NOT DETECTED Final   Bacteroides fragilis NOT DETECTED NOT DETECTED Final   Enterobacterales NOT DETECTED NOT DETECTED Final   Enterobacter cloacae complex NOT DETECTED NOT DETECTED Final   Escherichia coli NOT DETECTED NOT DETECTED Final   Klebsiella aerogenes NOT DETECTED NOT DETECTED Final   Klebsiella oxytoca NOT DETECTED NOT DETECTED Final   Klebsiella pneumoniae NOT DETECTED NOT DETECTED Final   Proteus species NOT DETECTED NOT DETECTED Final   Salmonella species NOT DETECTED NOT DETECTED Final   Serratia marcescens NOT DETECTED NOT DETECTED Final   Haemophilus influenzae NOT DETECTED NOT DETECTED Final   Neisseria meningitidis NOT DETECTED NOT DETECTED Final   Pseudomonas aeruginosa NOT DETECTED NOT DETECTED Final   Stenotrophomonas maltophilia NOT DETECTED NOT DETECTED Final   Candida albicans DETECTED (A) NOT DETECTED Final    Comment: CRITICAL RESULT CALLED TO, READ BACK BY AND VERIFIED WITH: JASON ROBBINS ON 07/15/24 AT 0408 QSD    Candida auris NOT DETECTED NOT DETECTED Final   Candida glabrata NOT DETECTED NOT DETECTED Final   Candida krusei NOT DETECTED NOT DETECTED Final   Candida parapsilosis NOT DETECTED NOT DETECTED Final   Candida tropicalis NOT DETECTED NOT DETECTED Final   Cryptococcus neoformans/gattii NOT DETECTED NOT DETECTED Final    Comment: Performed at [facility], [address] [place], [place] [address]  Culture, blood (Routine X 2) w Reflex to ID Panel     Status: None (Preliminary result)   Collection Time: 07/13/24 12:55 PM   Specimen: BLOOD  Result Value Ref Range Status   Specimen Description BLOOD BLOOD LEFT ARM  Final   Special Requests  Final    BOTTLES DRAWN AEROBIC AND ANAEROBIC Blood Culture adequate volume    Culture   Final    NO GROWTH 2 DAYS Performed at [facility], [address] [place], [place] [address]    Report Status PENDING  Incomplete    IMAGING: DG [place]ana MATSU Tube Plc W/Fl W/Rad Result Date: 07/10/2024 INDICATION: History of dysphagia with malnutrition and poor oral intake. EXAM: NASO G TUBE PLACEMENT WITH FL AND WITH RAD FLUOROSCOPY TIME:  Radiation Exposure Index (as provided by the fluoroscopic device): 17.6 mGy Kerma COMPLICATIONS: None immediate PROCEDURE: The Dobhoff tube was lubricated with viscous lidocaine  inserted into the left nostril. Under intermittent fluoroscopic guidance, the Dobhoff tube was advanced into the stomach. Several attempts were made to advance the tip past the pyloric sphincter and into the duodenum; however, this was unfortunately not achievable. A small amount of additional length was left in the stomach in hopes that peristalsis will advance the tube. A spot fluoroscopic image was saved for documentation purposes. The tube was affixed to the patient's nose with tape, but due to patient's excessive movement and attempts to grab the tube, the decision was made to bridle the Dobbhoff while the patient remained of fluoro. The tube is currently bridled at approximately 68 cm. The patient tolerated the procedure well without immediate postprocedural complication. IMPRESSION: Successful fluoroscopic guided placement of Dobhoff tube with tip terminating near the pyloric sphincter. The tube is ready for immediate use. This exam was performed by [facility] PA-C, and was supervised and interpreted by Dr. Ree Molt. Electronically Signed   By: Ree Molt M.D.   On: 07/10/2024 11:41   ECHOCARDIOGRAM COMPLETE Result Date: 07/05/2024    ECHOCARDIOGRAM REPORT   Patient Name:   [name] Date of Exam: 07/05/2024 Medical Rec #:  [id number]        Height:       66.0 in Accession #:    [id number]       Weight:       248.0 lb Date of Birth:  08/22/1946         BSA:          2.191 m Patient Age:    78 years         BP:           128/60 mmHg Patient Gender: F                HR:           132 bpm. Exam Location:  [facility] Procedure: 2D Echo, Cardiac Doppler and Color Doppler (Both Spectral and Color            Flow Doppler were utilized during procedure). Indications:     Shock R57.9  History:         Patient has prior history of Echocardiogram examinations, most                  recent 10/20/2023. Arrythmias:Atrial Fibrillation.  Sonographer:     Ashley McNeely-Sloane Referring Phys:  [id number] INGE JONETTA LECHER Diagnosing Phys: [name] Hanson MD  Sonographer Comments: Technically difficult study due to poor echo windows. Image acquisition challenging due to uncooperative patient. IMPRESSIONS  1. Left ventricular ejection fraction, by estimation, is 35 to 40%. The left ventricle has moderately decreased function. Left ventricular endocardial border not optimally defined to evaluate regional wall motion. The left ventricular internal cavity size was mildly dilated. Left ventricular diastolic function could not be evaluated.  2. Right ventricular systolic function is moderately reduced. The right ventricular size is moderately enlarged. There is mildly elevated pulmonary artery systolic pressure.  3. The mitral valve is grossly normal. No evidence of mitral valve regurgitation. No evidence of mitral stenosis.  4. Tricuspid valve regurgitation is mild to moderate.  5. The aortic valve was not well visualized. Aortic valve regurgitation is trivial. Aortic valve sclerosis is present, with no evidence of aortic valve stenosis.  6. The inferior vena cava is normal in size with <50% respiratory variability, suggesting right atrial pressure of 8 mmHg. FINDINGS  Left Ventricle: Left ventricular ejection fraction, by estimation, is 35 to 40%. The left ventricle has moderately decreased function. Left ventricular endocardial border not optimally defined to evaluate regional wall motion.  The left ventricular internal cavity size was mildly dilated. There is borderline left ventricular hypertrophy. Left ventricular diastolic function could not be evaluated due to atrial fibrillation. Left ventricular diastolic function could not be evaluated. Right Ventricle: The right ventricular size is moderately enlarged. No increase in right ventricular wall thickness. Right ventricular systolic function is moderately reduced. There is mildly elevated pulmonary artery systolic pressure. The tricuspid regurgitant velocity is 2.81 m/s, and with an assumed right atrial pressure of 8 mmHg, the estimated right ventricular systolic pressure is 39.6 mmHg. Left Atrium: Left atrial size was normal in size. Right Atrium: Right atrial size was normal in size. Pericardium: There is no evidence of pericardial effusion. Mitral Valve: The mitral valve is grossly normal. No evidence of mitral valve regurgitation. No evidence of mitral valve stenosis. Tricuspid Valve: The tricuspid valve is normal in structure. Tricuspid valve regurgitation is mild to moderate. Aortic Valve: The aortic valve was not well visualized. There is moderate aortic valve annular calcification. Aortic valve regurgitation is trivial. Aortic valve sclerosis is present, with no evidence of aortic valve stenosis. Aortic valve mean gradient measures 3.0 mmHg. Aortic valve peak gradient measures 7.2 mmHg. Aortic valve area, by VTI measures 1.37 cm. Pulmonic Valve: The pulmonic valve was not well visualized. Pulmonic valve regurgitation is not visualized. No evidence of pulmonic stenosis. Aorta: The aortic root is normal in size and structure. Pulmonary Artery: The pulmonary artery is not well seen. Venous: The inferior vena cava is normal in size with less than 50% respiratory variability, suggesting right atrial pressure of 8 mmHg. IAS/Shunts: No atrial level shunt detected by color flow Doppler.  LEFT VENTRICLE PLAX 2D LVIDd:         5.30 cm     Diastology  LVIDs:         4.65 cm     LV e' medial:    4.46 cm/s LV PW:         1.00 cm     LV E/e' medial:  14.7 LV IVS:        0.90 cm     LV e' lateral:   10.20 cm/s LVOT diam:     1.80 cm     LV E/e' lateral: 6.4 LV SV:         21 LV SV Index:   9 LVOT Area:     2.54 cm  LV Volumes (MOD) LV vol d, MOD A4C: 35.0 ml LV vol s, MOD A4C: 26.4 ml LV SV MOD A4C:     35.0 ml RIGHT VENTRICLE            IVC RV Basal diam:  4.70 cm    IVC diam: 1.90 cm RV Mid diam:  3.30 cm RV S prime:     7.40 cm/s TAPSE (M-mode): 1.2 cm LEFT ATRIUM             Index        RIGHT ATRIUM           Index LA diam:        4.15 cm 1.89 cm/m   RA Area:     12.40 cm LA Vol (A2C):   67.8 ml 30.94 ml/m  RA Volume:   28.10 ml  12.82 ml/m LA Vol (A4C):   60.9 ml 27.79 ml/m LA Biplane Vol: 66.4 ml 30.30 ml/m  AORTIC VALVE                    PULMONIC VALVE AV Area (Vmax):    1.30 cm     PV Vmax:        0.80 m/s AV Area (Vmean):   1.51 cm     PV Vmean:       62.300 cm/s AV Area (VTI):     1.37 cm     PV VTI:         0.133 m AV Vmax:           134.00 cm/s  PV Peak grad:   2.5 mmHg AV Vmean:          84.500 cm/s  PV Mean grad:   2.0 mmHg AV VTI:            0.150 m      RVOT Peak grad: 2 mmHg AV Peak Grad:      7.2 mmHg AV Mean Grad:      3.0 mmHg LVOT Vmax:         68.70 cm/s LVOT Vmean:        50.100 cm/s LVOT VTI:          0.081 m LVOT/AV VTI ratio: 0.54  AORTA Ao Root diam: 2.50 cm Ao Asc diam:  3.00 cm MITRAL VALVE               TRICUSPID VALVE MV Area (PHT): 8.43 cm    TR Peak grad:   31.6 mmHg MV Decel Time: 90 msec     TR Mean grad:   15.0 mmHg MV E velocity: 65.60 cm/s  TR Vmax:        281.00 cm/s                            TR Vmean:       191.0 cm/s                             SHUNTS                            Systemic VTI:  0.08 m                            Systemic Diam: 1.80 cm                            Pulmonic VTI:  0.104 m [name] Hanson MD Electronically signed by [name] Hanson MD Signature Date/Time: 07/05/2024/4:30:18 PM    Final     US  ARTERIAL LOWER EXTREMITY DUPLEX RIGHT(NON-ABI) Result Date: 07/04/2024 CLINICAL  DATA:  Lower extremity cellulitis and diminished pedal pulses. EXAM: RIGHT LOWER EXTREMITY ARTERIAL DUPLEX SCAN TECHNIQUE: Gray-scale sonography as well as color Doppler and duplex ultrasound was performed to evaluate the lower extremity arteries including the common, superficial and profunda femoral arteries, popliteal artery and calf arteries. COMPARISON:  None Available. FINDINGS: Right lower Extremity Inflow: Normal common femoral arterial waveforms and velocities. No evidence of inflow (aortoiliac) disease. Outflow: Normal profunda femoral, superficial femoral and popliteal arterial waveforms and velocities. No focal elevation of velocity to suggest stenosis. Runoff: Normal anterior tibial waveform and velocity. The posterior tibial artery demonstrates monophasic waveform and is open in the proximal to mid calf. Distal posterior tibial artery appears to be likely occluded. IMPRESSION: 1. No evidence of significant arterial occlusive disease in the right lower extremity from the common femoral artery through the popliteal artery. 2. 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 Electronically Signed   By: Marcey Moan M.D.   On: 07/04/2024 11:46   CT HEAD WO CONTRAST ( ) Result Date: 07/03/2024 EXAM: CT HEAD WITHOUT CONTRAST 07/03/2024 02:08:01 PM TECHNIQUE: CT of the head was performed without the administration of intravenous contrast. Automated exposure control, iterative reconstruction, and/or weight based adjustment of the mA/kV was utilized to reduce the radiation dose to as low as reasonably achievable. COMPARISON: CT head without contrast 04/18/2023. MR head without and with contrast 10/14/2023. CLINICAL HISTORY: Mental status change, unknown cause. AMS. FINDINGS: BRAIN AND VENTRICLES: No acute hemorrhage. No evidence of acute infarct. No  hydrocephalus. No extra-axial collection. No mass effect or midline shift. Periventricular and scattered subcortical white matter hyperattenuation is moderately advanced for age. ORBITS: No acute abnormality. SINUSES: Frothy secretions in left sphenoid sinus, compatible with acute sinusitis. A left gland replacement is noted. SOFT TISSUES AND SKULL: No acute soft tissue abnormality. No skull fracture. IMPRESSION: 1. No acute intracranial abnormality. 2. Moderately advanced periventricular and subcortical white matter changes for age. 3. Acute left sphenoid sinusitis. Electronically signed by: [name] Necessary MD 07/03/2024 02:28 PM EDT RP Workstation: [id number]   CT ABDOMEN PELVIS WO CONTRAST Result Date: 07/03/2024 CLINICAL DATA:  Provided history: Sepsis EXAM: CT ABDOMEN AND PELVIS WITHOUT CONTRAST TECHNIQUE: Multidetector CT imaging of the abdomen and pelvis was performed following the standard protocol without IV contrast. RADIATION DOSE REDUCTION: This exam was performed according to the departmental dose-optimization program which includes automated exposure control, adjustment of the mA and/or kV according to patient size and/or use of iterative reconstruction technique. COMPARISON:  CT 10/14/2023 FINDINGS: Lower chest: The heart is upper normal in size. Breathing motion artifact limits parenchymal assessment. Hepatobiliary: Mild hepatic steatosis. Stable cyst in the central liver. The gallbladder is partially distended. More detailed evaluation is obscured by motion at this level. Pancreas: No ductal dilatation or inflammation. Motion artifact through the pancreatic head. Spleen: Normal in size without focal abnormality. Adrenals/Urinary Tract: Normal adrenal glands. Significant motion artifact through the kidneys. No hydronephrosis. Evaluation of previous renal cysts is well as partially exophytic right renal lesion is limited due to motion. No intrarenal air. Partially distended urinary bladder.  There is air in the bladder wall. Stomach/Bowel: Motion limits assessment of bowel loops in the abdomen. Allowing for this, no evidence of obstruction. Moderate colonic stool within the left colon. Scattered colonic diverticula without diverticulitis. Vascular/Lymphatic: Aortic atherosclerosis without aneurysm. Infrarenal IVC filter in place. No definite abdominopelvic adenopathy. Reproductive: Bulky uterus consistent with fibroids, unchanged. No evidence of adnexal mass. Other: No free air  or ascites. Postsurgical change of the anterior abdominal wall. Musculoskeletal: The bones are under mineralized. Multilevel degenerative change throughout the lumbar spine with vacuum phenomenon. Mild L3 superior endplate compression fracture is new from prior exam but age indeterminate. There is also a mild L2 superior endplate compression fracture that is new. Prominent right hip arthropathy. Fatty atrophy of gluteal musculature. IMPRESSION: 1. 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 2. Mild L2 and L3 superior endplate compression fractures are new from February exam but age indeterminate. Recommend correlation with symptoms. 3. Colonic diverticulosis without diverticulitis. 4. Mild hepatic steatosis. Aortic Atherosclerosis (ICD10-I70.0) . Electronically Signed   By: Andrea Gasman M.D.   On: 07/03/2024 14:26   DG Chest Port 1 View Result Date: 07/03/2024 CLINICAL DATA:  Questionable sepsis, evaluate for abnormality. EXAM: PORTABLE CHEST 1 VIEW COMPARISON:  Radiographs 04/19/2023 and 09/09/2022.  CT 10/14/2023. FINDINGS: 1039 hours. Patient is rotated to the right. Stable chronic cardiomegaly and aortic ectasia. There is improved aeration of the lung bases and no focal airspace disease, pleural effusion or pneumothorax. Glenohumeral degenerative changes are present bilaterally without acute osseous abnormality. IMPRESSION: No evidence of acute cardiopulmonary process. Improved aeration of the lung  bases. Stable cardiomegaly. Electronically Signed   By: Elsie Perone M.D.   On: 07/03/2024 11:06    Assessment:   [name] is a 78 y.o. female with a complicated medical history initially hospitalized October 26 from [facility] with altered mental status and hypotension.  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  She has been on meropenem for these since October 26 for planned 14-day course.. She had a routine fu BCX to document clearance of the bacteremia on  November 5 and it is + for  Candida albicans identified.  She was started on fluconazole November 6.  White blood count is decreased from 17-16.  She has had no fevers. She has been seen by palliative care for failure to thrive and is considering PEG tube. She has had progressive declines including anasarca, with massive edema and wounds. She has a L TLC in her L femoral artery.   Recommendations Candidemia - line associated with L TLC in femoral artery Remove TLC Repeat bcx Echo will need to be repeated as done a few days ago prior to candidemia. However given ongoing palliative care discussions can hold for now.  Bacteremia (ESBL E coli) and UTI (Kleb PNA) with possible emphematos cystitis Day 12 of meropenem Can stop treatment a few days earlier than the planned 14 days if IV access cannot be obtained   Thank you very much for allowing me to participate in the care of this patient. Please call with questions.   Alm SQUIBB. Epifanio, MD
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
## 2024-07-15 NOTE — Plan of Care (Signed)
  Problem: Education: Goal: Ability to describe self-care measures that may prevent or decrease complications (Diabetes Survival Skills Education) will improve Outcome: Progressing Goal: Individualized Educational Video(s) Outcome: Progressing   Problem: Coping: Goal: Ability to adjust to condition or change in health will improve Outcome: Progressing   Problem: Fluid Volume: Goal: Ability to maintain a balanced intake and output will improve Outcome: Progressing   Problem: Health Behavior/Discharge Planning: Goal: Ability to identify and utilize available resources and services will improve Outcome: Progressing Goal: Ability to manage health-related needs will improve Outcome: Progressing   Problem: Metabolic: Goal: Ability to maintain appropriate glucose levels will improve Outcome: Progressing   Problem: Nutritional: Goal: Maintenance of adequate nutrition will improve Outcome: Progressing Goal: Progress toward achieving an optimal weight will improve Outcome: Progressing   Problem: Skin Integrity: Goal: Risk for impaired skin integrity will decrease Outcome: Progressing   Problem: Tissue Perfusion: Goal: Adequacy of tissue perfusion will improve Outcome: Progressing   Problem: Education: Goal: Knowledge of General Education information will improve Description: Including pain rating scale, medication(s)/side effects and non-pharmacologic comfort measures Outcome: Progressing   Problem: Health Behavior/Discharge Planning: Goal: Ability to manage health-related needs will improve Outcome: Progressing   Problem: Clinical Measurements: Goal: Ability to maintain clinical measurements within normal limits will improve Outcome: Progressing Goal: Will remain free from infection Outcome: Progressing Goal: Diagnostic test results will improve Outcome: Progressing Goal: Respiratory complications will improve Outcome: Progressing Goal: Cardiovascular complication will  be avoided Outcome: Progressing   Problem: Activity: Goal: Risk for activity intolerance will decrease Outcome: Progressing   Problem: Nutrition: Goal: Adequate nutrition will be maintained Outcome: Progressing   Problem: Coping: Goal: Level of anxiety will decrease Outcome: Progressing   Problem: Elimination: Goal: Will not experience complications related to bowel motility Outcome: Progressing Goal: Will not experience complications related to urinary retention Outcome: Progressing   Problem: Pain Managment: Goal: General experience of comfort will improve and/or be controlled Outcome: Progressing   Problem: Safety: Goal: Ability to remain free from injury will improve Outcome: Progressing   Problem: Skin Integrity: Goal: Risk for impaired skin integrity will decrease Outcome: Progressing   Problem: Clinical Measurements: Goal: Ability to avoid or minimize complications of infection will improve Outcome: Progressing   Problem: Skin Integrity: Goal: Skin integrity will improve Outcome: Progressing   Problem: Education: Goal: Ability to demonstrate management of disease process will improve Outcome: Progressing Goal: Ability to verbalize understanding of medication therapies will improve Outcome: Progressing Goal: Individualized Educational Video(s) Outcome: Progressing   Problem: Activity: Goal: Capacity to carry out activities will improve Outcome: Progressing   Problem: Cardiac: Goal: Ability to achieve and maintain adequate cardiopulmonary perfusion will improve Outcome: Progressing

## 2024-07-15 NOTE — Progress Notes (Signed)
 Brief Interventional Radiology Note:  78 year old female with a complex medical history currently admitted for altered mental status. IR consulted earlier this week for possible g-tube placement. Patient remains with elevated WBC count (16.1 < 17.4 yesterday) and cultures returning positive for Candida Albicans. Discussed with Dr. ONEIDA Haber and daughter that procedure would not happen today and that we would await repeat blood cultures before moving forward with placement. Daughter verbalized understanding.  Patient is also currently on Eliquis . IR will need 48hr hold prior to g-tube placement. Team is aware of this as well.   Will plan to re-evaluate patient status next week for possible g-tube placement. Team and family are aware. Consent obtained from daughter and in APP office. Patient is to remain DNR for g-tube procedure.  Please feel free to reach out to IR with any additional questions of concerns or if patient status changes prior to gastrostomy tube placement.   Thank you for allowing our service to participate in [name]'s care.   Electronically Signed: Rayshun Kandler M Malaky Tetrault, PA-C 07/15/2024, 10:39 AM
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
## 2024-07-16 ENCOUNTER — Inpatient Hospital Stay

## 2024-07-16 DIAGNOSIS — R6521 Severe sepsis with septic shock: Secondary | ICD-10-CM | POA: Diagnosis not present

## 2024-07-16 DIAGNOSIS — A4151 Sepsis due to Escherichia coli [E. coli]: Secondary | ICD-10-CM | POA: Diagnosis not present

## 2024-07-16 DIAGNOSIS — N170 Acute kidney failure with tubular necrosis: Secondary | ICD-10-CM | POA: Diagnosis not present

## 2024-07-16 DIAGNOSIS — Z515 Encounter for palliative care: Secondary | ICD-10-CM | POA: Diagnosis not present

## 2024-07-16 DIAGNOSIS — Z7189 Other specified counseling: Secondary | ICD-10-CM | POA: Diagnosis not present

## 2024-07-16 DIAGNOSIS — N390 Urinary tract infection, site not specified: Secondary | ICD-10-CM | POA: Diagnosis not present

## 2024-07-16 DIAGNOSIS — B377 Candidal sepsis: Secondary | ICD-10-CM | POA: Diagnosis not present

## 2024-07-16 DIAGNOSIS — A419 Sepsis, unspecified organism: Secondary | ICD-10-CM | POA: Diagnosis not present

## 2024-07-16 LAB — MAGNESIUM: Magnesium: 2.2 mg/dL (ref 1.7–2.4)

## 2024-07-16 LAB — BASIC METABOLIC PANEL WITH GFR
Anion gap: 8 (ref 5–15)
BUN: 65 mg/dL — ABNORMAL HIGH (ref 8–23)
CO2: 28 mmol/L (ref 22–32)
Calcium: 7.6 mg/dL — ABNORMAL LOW (ref 8.9–10.3)
Chloride: 112 mmol/L — ABNORMAL HIGH (ref 98–111)
Creatinine, Ser: 0.49 mg/dL (ref 0.44–1.00)
GFR, Estimated: >60 mL/min (ref >60)
Glucose, Bld: 229 mg/dL — ABNORMAL HIGH (ref 70–99)
Potassium: 3.9 mmol/L (ref 3.5–5.1)
Sodium: 148 mmol/L — ABNORMAL HIGH (ref 135–145)

## 2024-07-16 LAB — GLUCOSE, CAPILLARY
Glucose-Capillary: 127 mg/dL — ABNORMAL HIGH (ref 70–99)
Glucose-Capillary: 145 mg/dL — ABNORMAL HIGH (ref 70–99)
Glucose-Capillary: 155 mg/dL — ABNORMAL HIGH (ref 70–99)
Glucose-Capillary: 194 mg/dL — ABNORMAL HIGH (ref 70–99)
Glucose-Capillary: 199 mg/dL — ABNORMAL HIGH (ref 70–99)
Glucose-Capillary: 287 mg/dL — ABNORMAL HIGH (ref 70–99)

## 2024-07-16 MED ORDER — METOPROLOL TARTRATE 5 MG/5ML IV SOLN: 2.5000 mg | Freq: Four times a day (QID) | INTRAVENOUS | Status: DC | PRN

## 2024-07-16 MED ORDER — FREE WATER
200.0000 mL | Freq: Four times a day (QID) | Status: DC
  Administered 2024-07-16 – 2024-07-17 (×4): 200 mL

## 2024-07-16 NOTE — Plan of Care (Signed)
  Problem: Education: Goal: Ability to describe self-care measures that may prevent or decrease complications (Diabetes Survival Skills Education) will improve Outcome: Not Progressing Goal: Individualized Educational Video(s) Outcome: Not Progressing   Problem: Coping: Goal: Ability to adjust to condition or change in health will improve Outcome: Not Progressing   Problem: Fluid Volume: Goal: Ability to maintain a balanced intake and output will improve Outcome: Not Progressing   Problem: Health Behavior/Discharge Planning: Goal: Ability to identify and utilize available resources and services will improve Outcome: Not Progressing Goal: Ability to manage health-related needs will improve Outcome: Not Progressing   Problem: Metabolic: Goal: Ability to maintain appropriate glucose levels will improve Outcome: Not Progressing   Problem: Nutritional: Goal: Maintenance of adequate nutrition will improve Outcome: Not Progressing Goal: Progress toward achieving an optimal weight will improve Outcome: Not Progressing   Problem: Skin Integrity: Goal: Risk for impaired skin integrity will decrease Outcome: Not Progressing   Problem: Tissue Perfusion: Goal: Adequacy of tissue perfusion will improve Outcome: Not Progressing   Problem: Education: Goal: Knowledge of General Education information will improve Description: Including pain rating scale, medication(s)/side effects and non-pharmacologic comfort measures Outcome: Not Progressing
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
## 2024-07-16 NOTE — Plan of Care (Signed)
  Problem: Education: Goal: Ability to describe self-care measures that may prevent or decrease complications (Diabetes Survival Skills Education) will improve Outcome: Progressing Goal: Individualized Educational Video(s) Outcome: Progressing   Problem: Coping: Goal: Ability to adjust to condition or change in health will improve Outcome: Progressing   Problem: Fluid Volume: Goal: Ability to maintain a balanced intake and output will improve Outcome: Progressing   Problem: Health Behavior/Discharge Planning: Goal: Ability to identify and utilize available resources and services will improve Outcome: Progressing Goal: Ability to manage health-related needs will improve Outcome: Progressing   Problem: Metabolic: Goal: Ability to maintain appropriate glucose levels will improve Outcome: Progressing   Problem: Nutritional: Goal: Maintenance of adequate nutrition will improve Outcome: Progressing Goal: Progress toward achieving an optimal weight will improve Outcome: Progressing   Problem: Skin Integrity: Goal: Risk for impaired skin integrity will decrease Outcome: Progressing   Problem: Tissue Perfusion: Goal: Adequacy of tissue perfusion will improve Outcome: Progressing   Problem: Education: Goal: Knowledge of General Education information will improve Description: Including pain rating scale, medication(s)/side effects and non-pharmacologic comfort measures Outcome: Progressing   Problem: Health Behavior/Discharge Planning: Goal: Ability to manage health-related needs will improve Outcome: Progressing   Problem: Clinical Measurements: Goal: Ability to maintain clinical measurements within normal limits will improve Outcome: Progressing Goal: Will remain free from infection Outcome: Progressing Goal: Diagnostic test results will improve Outcome: Progressing Goal: Respiratory complications will improve Outcome: Progressing Goal: Cardiovascular complication will  be avoided Outcome: Progressing   Problem: Activity: Goal: Risk for activity intolerance will decrease Outcome: Progressing   Problem: Nutrition: Goal: Adequate nutrition will be maintained Outcome: Progressing   Problem: Coping: Goal: Level of anxiety will decrease Outcome: Progressing   Problem: Elimination: Goal: Will not experience complications related to bowel motility Outcome: Progressing Goal: Will not experience complications related to urinary retention Outcome: Progressing   Problem: Pain Managment: Goal: General experience of comfort will improve and/or be controlled Outcome: Progressing   Problem: Safety: Goal: Ability to remain free from injury will improve Outcome: Progressing   Problem: Skin Integrity: Goal: Risk for impaired skin integrity will decrease Outcome: Progressing   Problem: Clinical Measurements: Goal: Ability to avoid or minimize complications of infection will improve Outcome: Progressing   Problem: Skin Integrity: Goal: Skin integrity will improve Outcome: Progressing   Problem: Education: Goal: Ability to demonstrate management of disease process will improve Outcome: Progressing Goal: Ability to verbalize understanding of medication therapies will improve Outcome: Progressing Goal: Individualized Educational Video(s) Outcome: Progressing   Problem: Activity: Goal: Capacity to carry out activities will improve Outcome: Progressing   Problem: Cardiac: Goal: Ability to achieve and maintain adequate cardiopulmonary perfusion will improve Outcome: Progressing

## 2024-07-16 NOTE — Progress Notes (Signed)
       CROSS COVER NOTE  NAME: [name] MRN: [id number] DOB : [birth year]    Concern as stated by nurse / staff   her condition is declining I need you to come to the bedside for a reassessment, it is hard to get a oxygen reading on her- it is showing in the 60's. the pt is still afib 130's +, her only acces is a triple lumen central line and she sounds like she's gurgling like drowninng, on continuous tube feeding   SM I have my charge nurse in here and the family is at the bedside asking for a reassessment      Pertinent findings on chart review: Failure to thrive/ candidemia Septic shock now resolved  Patient Assessment Patient seen at bedside with bedside nurse-family member not at bedside.  She is lethargic unable to participate or answer questions, tachypneic, with anasarca, NG tube    07/17/2024    4:28 AM 07/17/2024   12:59 AM 07/16/2024   11:00 PM  Vitals with BMI  Systolic 90 134 111  Diastolic 67 62 69  Pulse 86 86 81      workup IMPRESSION: 1. Low lung volumes with mild bibasilar atelectasis and small right pleural effusion.  Assessment and  Interventions   Assessment:  A-fib with RVR Respiratory distress-chest x-ray without evidence of aspiration  Plan: Metoprolol  2.5 mg IV as needed sustained heart rate over 120 Continue Lasix  Overall prognosis appears poor based on chart review.  Patient is DNR/DNI      CRITICAL CARE Performed by: Delayne LULLA Solian   Total critical care time: 31 minutes  Critical care time was exclusive of separately billable procedures and treating other patients.  Critical care was necessary to treat or prevent imminent or life-threatening deterioration.  Critical care was time spent personally by me on the following activities: development of treatment plan with patient and/or surrogate as well as nursing, discussions with consultants, evaluation of patient's response to treatment, examination of patient, obtaining history  from patient or surrogate, ordering and performing treatments and interventions, ordering and review of laboratory studies, ordering and review of radiographic studies, pulse oximetry and re-evaluation of patient's condition.

## 2024-07-17 DIAGNOSIS — A4151 Sepsis due to Escherichia coli [E. coli]: Secondary | ICD-10-CM | POA: Diagnosis not present

## 2024-07-17 DIAGNOSIS — N170 Acute kidney failure with tubular necrosis: Secondary | ICD-10-CM | POA: Diagnosis not present

## 2024-07-17 DIAGNOSIS — R6521 Severe sepsis with septic shock: Secondary | ICD-10-CM | POA: Diagnosis not present

## 2024-07-17 LAB — BASIC METABOLIC PANEL WITH GFR
Anion gap: 12 (ref 5–15)
BUN: 60 mg/dL — ABNORMAL HIGH (ref 8–23)
CO2: 29 mmol/L (ref 22–32)
Calcium: 7.8 mg/dL — ABNORMAL LOW (ref 8.9–10.3)
Chloride: 106 mmol/L (ref 98–111)
Creatinine, Ser: 0.63 mg/dL (ref 0.44–1.00)
GFR, Estimated: >60 mL/min (ref >60)
Glucose, Bld: 309 mg/dL — ABNORMAL HIGH (ref 70–99)
Potassium: 4.2 mmol/L (ref 3.5–5.1)
Sodium: 147 mmol/L — ABNORMAL HIGH (ref 135–145)

## 2024-07-17 LAB — GLUCOSE, CAPILLARY
Glucose-Capillary: 202 mg/dL — ABNORMAL HIGH (ref 70–99)
Glucose-Capillary: 258 mg/dL — ABNORMAL HIGH (ref 70–99)
Glucose-Capillary: 288 mg/dL — ABNORMAL HIGH (ref 70–99)
Glucose-Capillary: 267 mg/dL — ABNORMAL HIGH (ref 70–99)
Glucose-Capillary: 229 mg/dL — ABNORMAL HIGH (ref 70–99)

## 2024-07-17 LAB — PHOSPHORUS: Phosphorus: 2.7 mg/dL (ref 2.5–4.6)

## 2024-07-17 MED ORDER — OXYCODONE HCL 5 MG PO TABS
5.0000 mg | ORAL_TABLET | ORAL | Status: DC | PRN
  Administered 2024-07-18 – 2024-07-19 (×3): 5 mg
  Filled 2024-07-17 (×3): qty 1

## 2024-07-17 MED ORDER — FREE WATER
100.0000 mL | Freq: Four times a day (QID) | Status: DC
  Administered 2024-07-18 (×3): 100 mL

## 2024-07-17 MED ORDER — METOPROLOL TARTRATE 25 MG PO TABS
12.5000 mg | ORAL_TABLET | Freq: Two times a day (BID) | ORAL | Status: DC
  Administered 2024-07-17 – 2024-07-19 (×5): 12.5 mg
  Filled 2024-07-17 (×7): qty 1

## 2024-07-17 MED ORDER — MORPHINE SULFATE (PF) 4 MG/ML IV SOLN
4.0000 mg | Freq: Two times a day (BID) | INTRAVENOUS | Status: DC | PRN
  Administered 2024-07-18 – 2024-07-20 (×5): 4 mg via INTRAVENOUS
  Filled 2024-07-17 (×5): qty 1

## 2024-07-17 MED ORDER — MORPHINE SULFATE (PF) 2 MG/ML IV SOLN
2.0000 mg | INTRAVENOUS | Status: DC | PRN
  Administered 2024-07-17: 2 mg via INTRAVENOUS
  Filled 2024-07-17: qty 1

## 2024-07-17 MED ORDER — OXYCODONE HCL 5 MG PO TABS: 5.0000 mg | ORAL_TABLET | ORAL | Status: DC | PRN

## 2024-07-17 MED ORDER — INSULIN ASPART 100 UNIT/ML IJ SOLN
0.0000 [IU] | INTRAMUSCULAR | Status: DC
  Administered 2024-07-17: 5 [IU] via SUBCUTANEOUS
  Administered 2024-07-18: 3 [IU] via SUBCUTANEOUS
  Administered 2024-07-18: 8 [IU] via SUBCUTANEOUS
  Administered 2024-07-18: 3 [IU] via SUBCUTANEOUS
  Administered 2024-07-18: 5 [IU] via SUBCUTANEOUS
  Administered 2024-07-18 (×3): 2 [IU] via SUBCUTANEOUS
  Administered 2024-07-19 (×3): 3 [IU] via SUBCUTANEOUS
  Administered 2024-07-19 (×2): 5 [IU] via SUBCUTANEOUS
  Administered 2024-07-20 (×2): 2 [IU] via SUBCUTANEOUS
  Administered 2024-07-20: 3 [IU] via SUBCUTANEOUS
  Administered 2024-07-20: 5 [IU] via SUBCUTANEOUS
  Filled 2024-07-17: qty 5
  Filled 2024-07-17: qty 3
  Filled 2024-07-17 (×2): qty 2
  Filled 2024-07-17 (×2): qty 1
  Filled 2024-07-17: qty 2
  Filled 2024-07-17: qty 1
  Filled 2024-07-17: qty 3
  Filled 2024-07-17: qty 8
  Filled 2024-07-17: qty 2
  Filled 2024-07-17: qty 5
  Filled 2024-07-17: qty 3
  Filled 2024-07-17: qty 1
  Filled 2024-07-17: qty 3
  Filled 2024-07-17 (×2): qty 5

## 2024-07-17 NOTE — Progress Notes (Addendum)
 Patient alert but does not respond unless spoken to or touched. Pt's wounds dressed and changed. Pt continuously saying No, Stop and Don't when legs are moved and dressings are changed. Pt given pain medication for dressing changes. CHG bath, peri and foley care completed. Dr Awanda at bedside, notified of patients intolerance for turning and concern for acuity of patient's care while on 2a. Dr Awanda stated she was going to adjust the medications and transfer patient back to ICU/Stepdown for acuity. CN notified.
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
## 2024-07-17 NOTE — Progress Notes (Signed)
 MEWS Progress Note  Patient Details Name: [name] MRN: [id number] DOB: Sep 26, 1945 Today's Date: 07/17/2024   MEWS Flowsheet Documentation:  Assess: MEWS Score Temp: 97.7 F (36.5 C) BP: (!) 97/54 MAP (mmHg): 67 Pulse Rate: 85 ECG Heart Rate: (!) 107 Resp: 18 Level of Consciousness: Responds to Pain SpO2: 98 % O2 Device: Room Air Patient Activity (if Appropriate): In bed O2 Flow Rate (L/min): 2 L/min Assess: MEWS Score MEWS Temp: 0 MEWS Systolic: 1 MEWS Pulse: 1 MEWS RR: 0 MEWS LOC: 2 MEWS Score: 4 MEWS Score Color: Red Assess: SIRS CRITERIA SIRS Temperature : 0 SIRS Respirations : 0 SIRS Pulse: 1 SIRS WBC: 0 SIRS Score Sum : 1 SIRS Temperature : 0 SIRS Pulse: 1 SIRS Respirations : 0 SIRS WBC: 0 SIRS Score Sum : 1 Assess: if the MEWS score is Yellow or Red Were vital signs accurate and taken at a resting state?: (P) Yes Does the patient meet 2 or more of the SIRS criteria?: (P) No MEWS guidelines implemented : (P) Yes, red Treat MEWS Interventions: (P) Considered administering scheduled or prn medications/treatments as ordered Take Vital Signs Increase Vital Sign Frequency : (P) Red: Q1hr x2, continue Q4hrs until patient remains green for 12hrs Escalate MEWS: Escalate: (P) Red: Discuss with charge nurse and notify provider. Consider notifying RRT. If remains red for 2 hours consider need for higher level of care Notify: Charge Nurse/RN Name of Charge Nurse/RN Notified: (P) Praweena RN Provider Notification Provider Name/Title: (P) Dr Michail Date Provider Notified: (P) 07/17/24 Time Provider Notified: (P) 1233 Method of Notification: (P) Face-to-face Notification Reason: (P) Change in status Provider response: (P) At bedside Date of Provider Response: (P) 07/17/24 Time of Provider Response: (P) 1233  MD at bedside. Pt to transfer to ICU    [name] 07/17/2024, 12:34 PM

## 2024-07-17 NOTE — Progress Notes (Signed)
 Dr. Cleatus informed of BP

## 2024-07-17 NOTE — Progress Notes (Signed)
 [place] sent to covering hospitalist by writer:   To Dr. Cleatus- Pt is not on any beta blockers;  Response obtained by provider: see orders/ metoprolol 

## 2024-07-17 NOTE — Progress Notes (Signed)
 [place] message sent to covering hospitalist by writer:   Hey Dr. cleatus her condition is declining I need you to come to the bedside for a reassessment, it is hard to get a oxygen reading on her- it is showing in the 60's. the pt is still afib 130's +, her only acces is a triple lumen central line and she sounds like she's gurgling and coughing a lot/ family is concerned, pt is also on continuous tube feeding.   [Provider to bedside at 2210 for reassessment/ metoprolol  held, lasix  given per verbal, CXRAY portable obtained]

## 2024-07-17 NOTE — Progress Notes (Signed)
 Notified by tele tech that patient had converted this morning prior to 7am. Pt also just had an 8 beat run of vtach. Notified Dr Awanda via epic chat as patient is stable at this time. Strip sent over in EMR. Pt currently responds to voice and touch with moans only. Pt's son called this AM and provided update.

## 2024-07-17 NOTE — Progress Notes (Signed)
 Verbal orders to re-time medication per Dr. Cleatus due to hypotension. See [facility]

## 2024-07-17 NOTE — Plan of Care (Signed)
  Problem: Coping: Goal: Ability to adjust to condition or change in health will improve Outcome: Progressing   Problem: Fluid Volume: Goal: Ability to maintain a balanced intake and output will improve Outcome: Progressing   Problem: Coping: Goal: Level of anxiety will decrease Outcome: Progressing
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
## 2024-07-18 DIAGNOSIS — A4151 Sepsis due to Escherichia coli [E. coli]: Secondary | ICD-10-CM | POA: Diagnosis not present

## 2024-07-18 DIAGNOSIS — B379 Candidiasis, unspecified: Secondary | ICD-10-CM

## 2024-07-18 DIAGNOSIS — S81801A Unspecified open wound, right lower leg, initial encounter: Secondary | ICD-10-CM

## 2024-07-18 DIAGNOSIS — S81802A Unspecified open wound, left lower leg, initial encounter: Secondary | ICD-10-CM

## 2024-07-18 DIAGNOSIS — Z1612 Extended spectrum beta lactamase (ESBL) resistance: Secondary | ICD-10-CM | POA: Diagnosis not present

## 2024-07-18 DIAGNOSIS — B9620 Unspecified Escherichia coli [E. coli] as the cause of diseases classified elsewhere: Secondary | ICD-10-CM

## 2024-07-18 DIAGNOSIS — R6521 Severe sepsis with septic shock: Secondary | ICD-10-CM | POA: Diagnosis not present

## 2024-07-18 DIAGNOSIS — N3080 Other cystitis without hematuria: Secondary | ICD-10-CM

## 2024-07-18 DIAGNOSIS — N170 Acute kidney failure with tubular necrosis: Secondary | ICD-10-CM | POA: Diagnosis not present

## 2024-07-18 LAB — CBC
HCT: 29.3 % — ABNORMAL LOW (ref 36.0–46.0)
Hemoglobin: 9.2 g/dL — ABNORMAL LOW (ref 12.0–15.0)
MCH: 28.6 pg (ref 26.0–34.0)
MCHC: 31.4 g/dL (ref 30.0–36.0)
MCV: 91 fL (ref 80.0–100.0)
Platelets: 201 K/uL (ref 150–400)
RBC: 3.22 MIL/uL — ABNORMAL LOW (ref 3.87–5.11)
RDW: 16.1 % — ABNORMAL HIGH (ref 11.5–15.5)
WBC: 11 K/uL — ABNORMAL HIGH (ref 4.0–10.5)
nRBC: 1.6 % — ABNORMAL HIGH (ref 0.0–0.2)

## 2024-07-18 LAB — URINALYSIS, ROUTINE W REFLEX MICROSCOPIC
Bilirubin Urine: NEGATIVE
Glucose, UA: NEGATIVE mg/dL
Ketones, ur: NEGATIVE mg/dL
Nitrite: NEGATIVE
Protein, ur: 30 mg/dL — AB
RBC / HPF: >50 RBC/hpf (ref 0–5)
Specific Gravity, Urine: 1.01 (ref 1.005–1.030)
WBC, UA: >50 WBC/hpf (ref 0–5)
pH: 6 (ref 5.0–8.0)

## 2024-07-18 LAB — BASIC METABOLIC PANEL WITH GFR
Anion gap: 11 (ref 5–15)
BUN: 62 mg/dL — ABNORMAL HIGH (ref 8–23)
CO2: 32 mmol/L (ref 22–32)
Calcium: 8 mg/dL — ABNORMAL LOW (ref 8.9–10.3)
Chloride: 104 mmol/L (ref 98–111)
Creatinine, Ser: 0.52 mg/dL (ref 0.44–1.00)
GFR, Estimated: >60 mL/min (ref >60)
Glucose, Bld: 178 mg/dL — ABNORMAL HIGH (ref 70–99)
Potassium: 4.2 mmol/L (ref 3.5–5.1)
Sodium: 147 mmol/L — ABNORMAL HIGH (ref 135–145)

## 2024-07-18 LAB — GLUCOSE, CAPILLARY
Glucose-Capillary: 128 mg/dL — ABNORMAL HIGH (ref 70–99)
Glucose-Capillary: 147 mg/dL — ABNORMAL HIGH (ref 70–99)
Glucose-Capillary: 149 mg/dL — ABNORMAL HIGH (ref 70–99)
Glucose-Capillary: 186 mg/dL — ABNORMAL HIGH (ref 70–99)
Glucose-Capillary: 197 mg/dL — ABNORMAL HIGH (ref 70–99)
Glucose-Capillary: 209 mg/dL — ABNORMAL HIGH (ref 70–99)
Glucose-Capillary: 244 mg/dL — ABNORMAL HIGH (ref 70–99)
Glucose-Capillary: 253 mg/dL — ABNORMAL HIGH (ref 70–99)
Glucose-Capillary: 285 mg/dL — ABNORMAL HIGH (ref 70–99)

## 2024-07-18 LAB — CULTURE, BLOOD (ROUTINE X 2)
Culture: NO GROWTH
Special Requests: ADEQUATE

## 2024-07-18 LAB — APTT
aPTT: 66 s — ABNORMAL HIGH (ref 24–36)
aPTT: 76 s — ABNORMAL HIGH (ref 24–36)

## 2024-07-18 MED ORDER — FREE WATER
100.0000 mL | Status: DC
  Administered 2024-07-18 – 2024-07-19 (×6): 100 mL

## 2024-07-18 MED ORDER — HEPARIN (PORCINE) 25000 UT/250ML-% IV SOLN
1200.0000 [IU]/h | INTRAVENOUS | Status: DC
  Administered 2024-07-18 – 2024-07-20 (×3): 1200 [IU]/h via INTRAVENOUS
  Filled 2024-07-18 (×3): qty 250

## 2024-07-18 NOTE — Progress Notes (Signed)
 Date of Admission:  07/03/2024   Total days of antibiotics ***        Day ***        Day ***        Day ***   ID: [name] is a 78 y.o. female with  *** Principal Problem:   Sepsis (HCC) Active Problems:   Lower urinary tract infectious disease   Candidemia (HCC)    Subjective: ***  Medications:   vitamin C   500 mg Per Tube BID   Chlorhexidine  Gluconate Cloth  6 each Topical Daily   dexamethasone   6 mg Per Tube Daily   feeding supplement (PROSource TF20)  60 mL Per Tube Daily   free water  100 mL Per Tube Q6H   furosemide   40 mg Intravenous Q6H   Gerhardt's butt cream   Topical BID   insulin  aspart  0-15 Units Subcutaneous Q4H   insulin  aspart  3 Units Subcutaneous Q4H   metoprolol  tartrate  12.5 mg Per Tube BID   mouth rinse  15 mL Mouth Rinse 4 times per day   pantoprazole  (PROTONIX ) IV  40 mg Intravenous Q24H   senna-docusate  1 tablet Per NG tube QHS    Objective: Vital signs in last 24 hours: Patient Vitals for the past 24 hrs:  BP Temp Temp src Pulse Resp SpO2 Weight  07/18/24 1140 -- -- -- 65 (!) 23 (!) 82 % --  07/18/24 1130 -- -- -- -- (!) 23 -- --  07/18/24 1120 -- -- -- -- (!) 21 -- --  07/18/24 1110 -- -- -- -- 13 -- --  07/18/24 1100 118/74 -- -- -- 15 -- --  07/18/24 1050 -- -- -- (!) 48 16 (!) 37 % --  07/18/24 1040 -- -- -- -- 20 -- --  07/18/24 1030 -- -- -- -- 13 -- --  07/18/24 1020 -- -- -- 80 15 97 % --  07/18/24 1010 -- -- -- -- 13 -- --  07/18/24 1000 109/77 -- -- -- 20 -- --  07/18/24 0950 -- -- -- 69 17 94 % --  07/18/24 0940 -- -- -- -- 18 -- --  07/18/24 0930 -- -- -- 68 13 100 % --  07/18/24 0920 -- -- -- -- 12 -- --  07/18/24 0910 -- -- -- 72 15 96 % --  07/18/24 0900 (!) 115/36 -- -- -- 13 -- --  07/18/24 0850 -- -- -- (!) 32 12 (!) 83 % --  07/18/24 0840 -- -- -- 61 13 94 % --  07/18/24 0830 -- -- -- 68 17 94 % --  07/18/24 0820 -- -- -- -- 14 -- --  07/18/24 0810 -- -- -- 81 (!) 22 99 % --  07/18/24 0800 114/62  98.2 F (36.8 C) Axillary 87 19 100 % --  07/18/24 0700 116/80 -- -- 68 14 98 % --  07/18/24 0600 (!) 115/100 -- -- 78 18 100 % --  07/18/24 0530 -- -- -- 90 15 100 % --  07/18/24 0500 (!) 111/32 -- -- 69 16 100 % --  07/18/24 0444 -- -- -- -- -- -- 106.7 kg  07/18/24 0400 111/63 -- -- 67 (!) 21 100 % --  07/18/24 0330 -- -- -- 74 19 (!) 85 % --  07/18/24 0300 117/74 -- -- 77 (!) 24 94 % --  07/18/24 0200 (!) 120/57 98.4 F (36.9 C) Axillary 85 18 100 % --  07/18/24 0130 -- -- --  72 17 100 % --  07/18/24 0100 115/66 -- -- 80 (!) 23 94 % --  07/18/24 0030 -- -- -- 82 16 100 % --  07/18/24 0000 117/70 -- -- 87 20 98 % --  07/17/24 2330 -- -- -- (!) 116 (!) 25 96 % --  07/17/24 2300 (!) 114/57 -- -- 98 16 100 % --  07/17/24 2230 -- -- -- (!) 108 (!) 23 99 % --  07/17/24 2200 104/74 -- -- (!) 101 15 94 % --  07/17/24 2000 (!) 132/91 98.4 F (36.9 C) Axillary 89 (!) 26 100 % --  07/17/24 1930 -- -- -- 87 13 98 % --  07/17/24 1900 (!) 125/57 -- -- 96 (!) 21 100 % --  07/17/24 1800 134/69 -- -- 94 13 100 % --  07/17/24 1700 109/67 -- -- 92 17 100 % --  07/17/24 1600 (!) 105/53 -- -- 83 14 100 % --  07/17/24 1550 -- -- -- 99 15 100 % --  07/17/24 1515 -- -- -- 92 15 100 % --  07/17/24 1500 -- -- -- 96 14 100 % --  07/17/24 1445 -- -- -- 98 15 100 % --  07/17/24 1430 -- -- -- (!) 104 (!) 24 100 % --  07/17/24 1415 -- -- -- (!) 103 14 100 % --  07/17/24 1408 -- -- -- -- 12 -- --  07/17/24 1401 122/64 97.6 F (36.4 C) Axillary (!) 110 15 99 % --  07/17/24 1231 (!) 97/54 97.7 F (36.5 C) Axillary -- 18 98 % --     Lines and Device Date on insertion # of days DC  Chiropractor     Foley     ETT       PHYSICAL EXAM:  General: Alert, cooperative, no distress, appears stated age.  Head: Normocephalic, without obvious abnormality, atraumatic. Eyes: Conjunctivae clear, anicteric sclerae. Pupils are equal ENT Nares normal. No drainage or sinus tenderness. Lips, mucosa,  and tongue normal. No Thrush Neck: Supple, symmetrical, no adenopathy, thyroid : non tender no carotid bruit and no JVD. Back: No CVA tenderness. Lungs: Clear to auscultation bilaterally. No Wheezing or Rhonchi. No rales. Heart: Regular rate and rhythm, no murmur, rub or gallop. Abdomen: Soft, non-tender,not distended. Bowel sounds normal. No masses Extremities: atraumatic, no cyanosis. No edema. No clubbing Skin: No rashes or lesions. Or bruising Lymph: Cervical, supraclavicular normal. Neurologic: Grossly non-focal  Lab Results    Latest Ref Rng & Units 07/18/2024    5:14 AM 07/15/2024    9:18 AM 07/14/2024    5:25 AM  CBC  WBC 4.0 - 10.5 K/uL 11.0  16.1  17.4   Hemoglobin 12.0 - 15.0 g/dL 9.2  9.7  89.8   Hematocrit 36.0 - 46.0 % 29.3  31.7  32.3   Platelets 150 - 400 K/uL 201  156  164        Latest Ref Rng & Units 07/18/2024    5:14 AM 07/17/2024    9:22 AM 07/16/2024    4:20 AM  CMP  Glucose 70 - 99 mg/dL 821  690  770   BUN 8 - 23 mg/dL 62  60  65   Creatinine 0.44 - 1.00 mg/dL 9.47  9.36  9.50   Sodium 135 - 145 mmol/L 147  147  148   Potassium 3.5 - 5.1 mmol/L 4.2  4.2  3.9   Chloride 98 - 111 mmol/L 104  106  112  CO2 22 - 32 mmol/L 32  29  28   Calcium  8.9 - 10.3 mg/dL 8.0  7.8  7.6       Microbiology:  Studies/Results: DG Chest Port 1 View Result Date: 07/16/2024 EXAM: 1 VIEW(S) XRAY OF THE CHEST 07/16/2024 10:34:55 PM COMPARISON: 07/03/2024 CLINICAL HISTORY: Shortness of breath FINDINGS: LINES, TUBES AND DEVICES: Feeding tube coursing below the diaphragm. LUNGS AND PLEURA: Low lung volumes. Mild bibasilar atelectasis. Small right pleural effusion. No focal pulmonary opacity. No pulmonary edema. No pneumothorax. HEART AND MEDIASTINUM: Stable mild cardiomegaly. BONES AND SOFT TISSUES: No acute osseous abnormality. IMPRESSION: 1. Low lung volumes with mild bibasilar atelectasis and small right pleural effusion. Electronically signed by: Pinkie Pebbles MD  07/16/2024 10:41 PM EST RP Workstation: [id number]     Assessment/Plan: ***
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
## 2024-07-18 NOTE — Progress Notes (Signed)
 Daily Progress Note   Patient Name: [name]       Date: 07/18/2024 DOB: 1946/06/08  Age: 78 y.o. MRN#: [id number] Attending Physician: Awanda City, MD Primary Care Physician: Lorel Maxie LABOR, MD Admit Date: 07/03/2024  Reason for Consultation/Follow-up: Establishing goals of care  Subjective: Notes, labs, diagnostics reviewed.  Leukocytosis is downtrending.  Antibiotics have been stopped.  Repeat blood cultures collected on 11/7 with no growth as of day 3.   Extensive goals of care discussions have been completed with patient's son and daughter. Per most recent notes and updates, daughter wishes to shift to comfort care, and son would like to continue life-sustaining care including PEG tube placement; as such, daughter will support her brother Jonathan's  decisions.   In to see patient.  She is currently resting in bed at this time with NG tube in place.  She does not awaken or respond to voice or touch.  No family at bedside.  Length of Stay: 15  Current Medications: Scheduled Meds:   vitamin C   500 mg Per Tube BID   Chlorhexidine  Gluconate Cloth  6 each Topical Daily   dexamethasone   6 mg Per Tube Daily   feeding supplement (PROSource TF20)  60 mL Per Tube Daily   free water  100 mL Per Tube Q6H   furosemide   40 mg Intravenous Q6H   Gerhardt's butt cream   Topical BID   insulin  aspart  0-15 Units Subcutaneous Q4H   insulin  aspart  3 Units Subcutaneous Q4H   metoprolol  tartrate  12.5 mg Per Tube BID   mouth rinse  15 mL Mouth Rinse 4 times per day   pantoprazole  (PROTONIX ) IV  40 mg Intravenous Q24H   senna-docusate  1 tablet Per NG tube QHS    Continuous Infusions:  feeding supplement (OSMOLITE 1.5 CAL) 1,000 mL (07/18/24 1003)   fluconazole (DIFLUCAN) IV Stopped  (07/18/24 0732)   heparin       PRN Meds: dextrose , docusate sodium , Influenza vac split trivalent PF, ipratropium-albuterol , metoprolol  tartrate, morphine  injection, mouth rinse, oxyCODONE , polyethylene glycol  Physical Exam Constitutional:      Comments: Eyes closed  Pulmonary:     Effort: Pulmonary effort is normal.  Skin:    General: Skin is warm and dry.  Vital Signs: BP 118/74   Pulse 65   Temp 98.2 F (36.8 C) (Axillary)   Resp (!) 23   Ht 5' 6 (1.676 m)   Wt 106.7 kg   SpO2 (!) 82%   BMI 37.97 kg/m  SpO2: SpO2: (!) 82 % O2 Device: O2 Device: Room Air O2 Flow Rate: O2 Flow Rate (L/min): 2 L/min  Intake/output summary:  Intake/Output Summary (Last 24 hours) at 07/18/2024 1208 Last data filed at 07/18/2024 0900 Gross per 24 hour  Intake 2526.28 ml  Output 4400 ml  Net -1873.72 ml   LBM: Last BM Date : 07/17/24 Baseline Weight: Weight: 109.6 kg Most recent weight: Weight: 106.7 kg    Patient Active Problem List   Diagnosis Date Noted   Candidemia (HCC) 07/15/2024   Sepsis (HCC) 07/03/2024   Lower urinary tract infectious disease 07/03/2024   Hypokalemia 04/24/2023   Acute postoperative anemia due to expected blood loss 04/22/2023   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   Hypernatremia 04/20/2023   Acute metabolic encephalopathy 04/20/2023   Hypoglycemia 04/19/2023   Aspiration pneumonia of right lower lobe due to vomit (HCC) 04/19/2023   SBO (small bowel obstruction) (HCC) 04/18/2023   Coffee ground emesis 04/18/2023   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/18/2023   Peripheral neuropathy 04/18/2023   Acute DVT of right tibial vein (HCC) 03/30/2023   Facial weakness 03/30/2023   DVT (deep venous thrombosis) (HCC) 03/30/2023   Mouth droop due to facial weakness 03/29/2023   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) 03/29/2023   Cellulitis of lower extremity 09/10/2022   Near syncope  08/22/2022   Constipation 08/22/2022   Stage 3a chronic kidney disease (HCC) 06/13/2022   Ulcers of both lower extremities, with unspecified severity (HCC) 03/03/2022   Obesity (BMI 30-39.9) 03/03/2022   Anemia of chronic disease 03/03/2022   PAD (peripheral artery disease) 05/30/2020   Malignant neoplasm of right female breast (HCC) 05/13/2018   Venous insufficiency of both lower extremities 11/20/2017   Chronic diastolic CHF (congestive heart failure) (HCC) 11/20/2017   Weight gain 11/20/2017   Lymphedema 04/19/2017   Benign essential HTN 04/19/2017   OSA (obstructive sleep apnea) 04/19/2017   Morbid obesity (HCC) 04/19/2017   TIA (transient ischemic attack) 03/28/2017   Urinary incontinence 06/27/2016   Breast cancer (HCC) 09/08/2004    Palliative Care Assessment & Plan    Recommendations/Plan: Continue care   Code Status:    Code Status Orders  (From admission, onward)           Start     Ordered   07/11/24 1401  Do not attempt resuscitation (DNR) Pre-Arrest Interventions Desired  (Code Status)  Continuous       Question Answer Comment  If pulseless and not breathing No CPR or chest compressions.   In Pre-Arrest Conditions (Patient Has Pulse and Is Breathing) May intubate, use advanced airway interventions and cardioversion/ACLS medications if appropriate or indicated. May transfer to ICU.   Consent: Discussion documented in EHR or advanced directives reviewed      07/11/24 1400           Code Status History     Date Active Date Inactive Code Status Order ID Comments User Context   07/03/2024 1339 07/11/2024 1400 Full Code [id number]  Maranda Lonell MATSU, NP ED   04/18/2023 0322 04/28/2023 1907 Full Code [id number]  Mansy, Madison LABOR, MD ED   03/29/2023 1955 03/31/2023 2043 Full Code [id number]  Tobie,  Mario GAILS, MD ED   09/10/2022 1318 09/16/2022 1911 Full Code [id number]  Celinda Alm Lot, MD ED   08/22/2022 1541 08/23/2022 2011 Full Code [id number]  Cox, Amy LOISE, DO ED    06/13/2022 0404 06/15/2022 1820 Full Code [id number]  Cleatus Delayne GAILS, MD ED   03/03/2022 [phone] 03/07/2022 1523 Full Code [id number]  Debby Camila LABOR, MD ED   03/28/2017 0418 03/28/2017 2122 Full Code [id number]  Lonzo Pollen, MD Inpatient      Advance Directive Documentation    Flowsheet Row Most Recent Value  Type of Advance Directive Out of facility DNR (pink MOST or yellow form)  Pre-existing out of facility DNR order (yellow form or pink MOST form) Pink MOST form placed in chart (order not valid for inpatient use)  MOST Form in Place? --   Camelia Lewis, NP  Please contact Palliative Medicine Team phone at [phone] for questions and concerns.

## 2024-07-18 NOTE — Consult Note (Signed)
 PHARMACY - ANTICOAGULATION CONSULT NOTE  Pharmacy Consult for heparin  dosing Indication: atrial fibrillation hx DVT  No Known Allergies  Patient Measurements: Height: 5' 6 (167.6 cm) Weight: 106.7 kg (235 lb 3.7 oz) IBW/kg (Calculated) : 59.3 HEPARIN  DW (KG): 84.8  Vital Signs: Temp: 98.2 F (36.8 C) (11/10 0800) Temp Source: Axillary (11/10 0800) BP: 116/80 (11/10 0700) Pulse Rate: 68 (11/10 0700)  Labs: Recent Labs    07/16/24 0420 07/17/24 0922 07/18/24 0514  HGB  --   --  9.2*  HCT  --   --  29.3*  PLT  --   --  201  CREATININE 0.49 0.63 0.52    Estimated Creatinine Clearance: 71.6 mL/min (by C-G formula based on SCr of 0.52 mg/dL).   Medical History: Past Medical History:  Diagnosis Date   Abnormality of gait    Acute on chronic diastolic CHF (congestive heart failure) (HCC) 11/20/2017   Acute respiratory failure with hypoxia (HCC) 06/13/2022   AKI (acute kidney injury) 03/03/2022   Breast cancer (HCC) 02/05/2005   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    Breast cancer (HCC) 2019   right breast   Chronic kidney disease, stage 3b (HCC) 09/10/2022   Complication of anesthesia 2006   pt states hard to wake up after L breast surgery    DVT (deep venous thrombosis) (HCC)    a. 03/2023 R PT Vein DVT following abd surgery-->s/p IVC filter.   Edema    GERD (gastroesophageal reflux disease)    Glucose intolerance 09/10/2022   Headache    Heart failure with improved ejection fraction (HFimpEF) (HCC)    a. 07/2011 Echo: EF 20-25%; b. 03/2017 Echo: EF 50-55%; c. 02/2022 Echo: EF 50-55%. mild LVH, GrI DD, Nl RV fxn, mild BAE, mild MR, mild AoV sclerosis, mild AI.   Heart murmur    Hypertension    Hypertensive emergency 06/13/2022   Lower limb ulcer, calf, left, limited to breakdown of skin (HCC) 12/11/2020   Malignant neoplasm of breast (female), unspecified site 06/14/2018    RIGHT T1b, N0; ER/PR positive, HER-2 negative.   Neuropathy    Obesity    Pain in joint, site unspecified    Personal history of chemotherapy 2006   left breast   Personal history of radiation therapy 2019   right breast   Personal history of radiation therapy 2006   left breast   Pneumonia    Pyuria 08/22/2022   Sleep apnea    Syncope 03/03/2022   Tinea pedis    Unspecified hearing loss    bilateral   Weakness 03/03/2022    Medications:  Last apixaban  dose on 11/9 at 2230  Assessment: Andreah Goheen is a 3 yoF that presented on 10/26 with AMS and hypotension. Past medical history notable for CHF (EF 40-45% 10/21/23), CKD3b, PVD, brain injury, DVT s/p IV filter (03/2023), and malignant neoplasm of Breast s/p chemo and radiation.   Home apixaban  was resumed on 11/6. IR to place G-tube tentatively on Aug 12, 2024. Anticoagulation plan is to bridge from apixaban  to heparin  gtt and hold heparin  2-4 hours prior to procedure. Post-procedure, resume with apixaban  or heparin .  Currently, patient in and out of atrial fibrillation.   Goal of Therapy:  Heparin  level 0.3-0.7 units/ml aPTT 66-102 seconds Monitor platelets by anticoagulation protocol: Yes   Plan:  Start heparin  infusion rate at 1200 units/hr without bolus timed for 1100 (next apixaban  dose due) Ordered baseline INR,  aPTT Check aPTT in 8 hours and daily while on heparin , until correlation with anti-xa Monitor CBC daily  Leonor BROCKS Prayan Ulin 07/18/2024,9:19 AM

## 2024-07-18 NOTE — Consult Note (Signed)
 PHARMACY - ANTICOAGULATION CONSULT NOTE  Pharmacy Consult for heparin  dosing Indication: atrial fibrillation hx DVT  No Known Allergies  Patient Measurements: Height: 5' 6 (167.6 cm) Weight: 106.7 kg (235 lb 3.7 oz) IBW/kg (Calculated) : 59.3 HEPARIN  DW (KG): 84.8  Vital Signs: Temp: 98.5 F (36.9 C) (11/10 1600) Temp Source: Axillary (11/10 1600) BP: 125/62 (11/10 2000) Pulse Rate: 80 (11/10 2000)  Labs: Recent Labs    07/16/24 0420 07/17/24 0922 07/18/24 0514 07/18/24 1400 07/18/24 1957  HGB  --   --  9.2*  --   --   HCT  --   --  29.3*  --   --   PLT  --   --  201  --   --   APTT  --   --   --  66* 76*  CREATININE 0.49 0.63 0.52  --   --     Estimated Creatinine Clearance: 71.6 mL/min (by C-G formula based on SCr of 0.52 mg/dL).   Medical History: Past Medical History:  Diagnosis Date   Abnormality of gait    Acute on chronic diastolic CHF (congestive heart failure) (HCC) 11/20/2017   Acute respiratory failure with hypoxia (HCC) 06/13/2022   AKI (acute kidney injury) 03/03/2022   Breast cancer (HCC) 02/05/2005   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    Breast cancer (HCC) 2019   right breast   Chronic kidney disease, stage 3b (HCC) 09/10/2022   Complication of anesthesia 2006   pt states hard to wake up after L breast surgery    DVT (deep venous thrombosis) (HCC)    a. 03/2023 R PT Vein DVT following abd surgery-->s/p IVC filter.   Edema    GERD (gastroesophageal reflux disease)    Glucose intolerance 09/10/2022   Headache    Heart failure with improved ejection fraction (HFimpEF) (HCC)    a. 07/2011 Echo: EF 20-25%; b. 03/2017 Echo: EF 50-55%; c. 02/2022 Echo: EF 50-55%. mild LVH, GrI DD, Nl RV fxn, mild BAE, mild MR, mild AoV sclerosis, mild AI.   Heart murmur    Hypertension    Hypertensive emergency 06/13/2022   Lower limb ulcer, calf, left, limited to breakdown of  skin (HCC) 12/11/2020   Malignant neoplasm of breast (female), unspecified site 06/14/2018   RIGHT T1b, N0; ER/PR positive, HER-2 negative.   Neuropathy    Obesity    Pain in joint, site unspecified    Personal history of chemotherapy 2006   left breast   Personal history of radiation therapy 2019   right breast   Personal history of radiation therapy 2006   left breast   Pneumonia    Pyuria 08/22/2022   Sleep apnea    Syncope 03/03/2022   Tinea pedis    Unspecified hearing loss    bilateral   Weakness 03/03/2022    Medications:  Last apixaban  dose on 11/9 at 2230  Assessment: [name] is a 80 yoF that presented on 10/26 with AMS and hypotension. Past medical history notable for CHF (EF 40-45% 10/21/23), CKD3b, PVD, brain injury, DVT s/p IV filter (03/2023), and malignant neoplasm of Breast s/p chemo and radiation.   Home apixaban  was resumed on 11/6. IR to place G-tube tentatively on July 24, 2024. Anticoagulation plan is to bridge from apixaban  to heparin  gtt and hold heparin  2-4 hours prior to procedure. Post-procedure, resume with apixaban  or heparin .  Currently, patient in and out of atrial fibrillation.  Goal of Therapy:  Heparin  level 0.3-0.7 units/ml aPTT 66-102 seconds Monitor platelets by anticoagulation protocol: Yes   Plan:  11/10@1957 : aPTT 76 sec, therapeutic x 1 Continue heparin  infusion rate at 1200 units/hr Check aPTT in 8 hours and heparin  level daily while on heparin  Will continue to dose via aPTT until correlation is seen, then will transition to heparin  level dosin Monitor CBC daily  Irania Durell A Lexandra Rettke 07/18/2024,8:44 PM

## 2024-07-18 NOTE — Progress Notes (Addendum)
  IR BRIEF PROGRESS NOTE:  IR is requested for G-tube placement. Patient has unfortunately had elevated leukocytosis over the past week, precluding procedure. Repeat blood cultures have shown no growth >3-5 days, and leukocytosis has resolved sufficiently to proceed as of this am. Therefore, IR will tentatively proceed with G-tube placement on Wednesday August 01, 2024, pending 48 hours of Eliquis  hold. Eliquis  has been held in [facility] as of this morning. Last dose given on 11/9 @ 22:30.  Heparin  bridge will need to be paused 2-4 hours prior to procedure, and can be restarted after 4 hours s/p G-tube placement. Care team advised.   Electronically Signed: Carlin DELENA Griffon, PA-C 07/18/2024, 8:48 AM

## 2024-07-18 NOTE — Plan of Care (Signed)
  Problem: Education: Goal: Ability to describe self-care measures that may prevent or decrease complications (Diabetes Survival Skills Education) will improve Outcome: Progressing Goal: Individualized Educational Video(s) Outcome: Progressing   Problem: Coping: Goal: Ability to adjust to condition or change in health will improve Outcome: Progressing   Problem: Fluid Volume: Goal: Ability to maintain a balanced intake and output will improve Outcome: Progressing   Problem: Health Behavior/Discharge Planning: Goal: Ability to identify and utilize available resources and services will improve Outcome: Progressing Goal: Ability to manage health-related needs will improve Outcome: Progressing   Problem: Metabolic: Goal: Ability to maintain appropriate glucose levels will improve Outcome: Progressing   Problem: Nutritional: Goal: Maintenance of adequate nutrition will improve Outcome: Progressing Goal: Progress toward achieving an optimal weight will improve Outcome: Progressing   Problem: Skin Integrity: Goal: Risk for impaired skin integrity will decrease Outcome: Progressing   Problem: Tissue Perfusion: Goal: Adequacy of tissue perfusion will improve Outcome: Progressing   Problem: Education: Goal: Knowledge of General Education information will improve Description: Including pain rating scale, medication(s)/side effects and non-pharmacologic comfort measures Outcome: Progressing   Problem: Health Behavior/Discharge Planning: Goal: Ability to manage health-related needs will improve Outcome: Progressing   Problem: Clinical Measurements: Goal: Ability to maintain clinical measurements within normal limits will improve Outcome: Progressing Goal: Will remain free from infection Outcome: Progressing Goal: Diagnostic test results will improve Outcome: Progressing Goal: Respiratory complications will improve Outcome: Progressing Goal: Cardiovascular complication will  be avoided Outcome: Progressing   Problem: Activity: Goal: Risk for activity intolerance will decrease Outcome: Progressing   Problem: Nutrition: Goal: Adequate nutrition will be maintained Outcome: Progressing   Problem: Coping: Goal: Level of anxiety will decrease Outcome: Progressing   Problem: Elimination: Goal: Will not experience complications related to bowel motility Outcome: Progressing Goal: Will not experience complications related to urinary retention Outcome: Progressing   Problem: Pain Managment: Goal: General experience of comfort will improve and/or be controlled Outcome: Progressing   Problem: Safety: Goal: Ability to remain free from injury will improve Outcome: Progressing   Problem: Skin Integrity: Goal: Risk for impaired skin integrity will decrease Outcome: Progressing   Problem: Clinical Measurements: Goal: Ability to avoid or minimize complications of infection will improve Outcome: Progressing   Problem: Skin Integrity: Goal: Skin integrity will improve Outcome: Progressing   Problem: Education: Goal: Ability to demonstrate management of disease process will improve Outcome: Progressing Goal: Ability to verbalize understanding of medication therapies will improve Outcome: Progressing Goal: Individualized Educational Video(s) Outcome: Progressing   Problem: Activity: Goal: Capacity to carry out activities will improve Outcome: Progressing   Problem: Cardiac: Goal: Ability to achieve and maintain adequate cardiopulmonary perfusion will improve Outcome: Progressing

## 2024-07-18 NOTE — Plan of Care (Signed)
  Problem: Education: Goal: Ability to describe self-care measures that may prevent or decrease complications (Diabetes Survival Skills Education) will improve Outcome: Progressing Goal: Individualized Educational Video(s) Outcome: Progressing   Problem: Coping: Goal: Ability to adjust to condition or change in health will improve Outcome: Progressing   Problem: Fluid Volume: Goal: Ability to maintain a balanced intake and output will improve Outcome: Progressing   Problem: Health Behavior/Discharge Planning: Goal: Ability to identify and utilize available resources and services will improve Outcome: Progressing Goal: Ability to manage health-related needs will improve Outcome: Progressing   Problem: Nutritional: Goal: Maintenance of adequate nutrition will improve Outcome: Progressing Goal: Progress toward achieving an optimal weight will improve Outcome: Progressing   Problem: Skin Integrity: Goal: Risk for impaired skin integrity will decrease Outcome: Progressing   Problem: Tissue Perfusion: Goal: Adequacy of tissue perfusion will improve Outcome: Progressing   Problem: Education: Goal: Knowledge of General Education information will improve Description: Including pain rating scale, medication(s)/side effects and non-pharmacologic comfort measures Outcome: Progressing

## 2024-07-19 DIAGNOSIS — R6521 Severe sepsis with septic shock: Secondary | ICD-10-CM | POA: Diagnosis not present

## 2024-07-19 DIAGNOSIS — A4151 Sepsis due to Escherichia coli [E. coli]: Secondary | ICD-10-CM | POA: Diagnosis not present

## 2024-07-19 DIAGNOSIS — N170 Acute kidney failure with tubular necrosis: Secondary | ICD-10-CM | POA: Diagnosis not present

## 2024-07-19 LAB — BASIC METABOLIC PANEL WITH GFR
Anion gap: 11 (ref 5–15)
BUN: 65 mg/dL — ABNORMAL HIGH (ref 8–23)
CO2: 36 mmol/L — ABNORMAL HIGH (ref 22–32)
Calcium: 7.9 mg/dL — ABNORMAL LOW (ref 8.9–10.3)
Chloride: 101 mmol/L (ref 98–111)
Creatinine, Ser: 0.51 mg/dL (ref 0.44–1.00)
GFR, Estimated: >60 mL/min (ref >60)
Glucose, Bld: 224 mg/dL — ABNORMAL HIGH (ref 70–99)
Potassium: 4 mmol/L (ref 3.5–5.1)
Sodium: 148 mmol/L — ABNORMAL HIGH (ref 135–145)

## 2024-07-19 LAB — GLUCOSE, CAPILLARY
Glucose-Capillary: 169 mg/dL — ABNORMAL HIGH (ref 70–99)
Glucose-Capillary: 177 mg/dL — ABNORMAL HIGH (ref 70–99)
Glucose-Capillary: 192 mg/dL — ABNORMAL HIGH (ref 70–99)
Glucose-Capillary: 199 mg/dL — ABNORMAL HIGH (ref 70–99)
Glucose-Capillary: 218 mg/dL — ABNORMAL HIGH (ref 70–99)
Glucose-Capillary: 239 mg/dL — ABNORMAL HIGH (ref 70–99)

## 2024-07-19 LAB — CBC
HCT: 28 % — ABNORMAL LOW (ref 36.0–46.0)
Hemoglobin: 8.7 g/dL — ABNORMAL LOW (ref 12.0–15.0)
MCH: 28.4 pg (ref 26.0–34.0)
MCHC: 31.1 g/dL (ref 30.0–36.0)
MCV: 91.5 fL (ref 80.0–100.0)
Platelets: 209 K/uL (ref 150–400)
RBC: 3.06 MIL/uL — ABNORMAL LOW (ref 3.87–5.11)
RDW: 16.3 % — ABNORMAL HIGH (ref 11.5–15.5)
WBC: 9.6 K/uL (ref 4.0–10.5)
nRBC: 1 % — ABNORMAL HIGH (ref 0.0–0.2)

## 2024-07-19 LAB — APTT: aPTT: 90 s — ABNORMAL HIGH (ref 24–36)

## 2024-07-19 LAB — ALBUMIN: Albumin: 1.5 g/dL — ABNORMAL LOW (ref 3.5–5.0)

## 2024-07-19 LAB — HEPARIN LEVEL (UNFRACTIONATED): Heparin Unfractionated: >1.1 [IU]/mL — ABNORMAL HIGH (ref 0.30–0.70)

## 2024-07-19 LAB — PROTIME-INR
INR: 1.4 — ABNORMAL HIGH (ref 0.8–1.2)
Prothrombin Time: 17.5 s — ABNORMAL HIGH (ref 11.4–15.2)

## 2024-07-19 MED ORDER — FUROSEMIDE 10 MG/ML IJ SOLN
40.0000 mg | Freq: Four times a day (QID) | INTRAMUSCULAR | Status: DC
Start: 2024-07-19 — End: 2024-07-19
  Administered 2024-07-19 (×2): 40 mg via INTRAVENOUS
  Filled 2024-07-19 (×2): qty 4

## 2024-07-19 MED ORDER — FREE WATER
100.0000 mL | Freq: Four times a day (QID) | Status: DC
  Administered 2024-07-19: 100 mL

## 2024-07-19 MED ORDER — FUROSEMIDE 10 MG/ML IJ SOLN
40.0000 mg | Freq: Four times a day (QID) | INTRAMUSCULAR | Status: DC
  Administered 2024-07-20 (×4): 40 mg via INTRAVENOUS
  Filled 2024-07-19 (×4): qty 4

## 2024-07-19 NOTE — Plan of Care (Signed)
  Problem: Pain Managment: Goal: General experience of comfort will improve and/or be controlled Outcome: Progressing   Problem: Safety: Goal: Ability to remain free from injury will improve Outcome: Progressing
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
## 2024-07-19 NOTE — Consult Note (Signed)
 PHARMACY - ANTICOAGULATION CONSULT NOTE  Pharmacy Consult for heparin  dosing Indication: atrial fibrillation hx DVT  No Known Allergies  Patient Measurements: Height: 5' 6 (167.6 cm) Weight: 106.7 kg (235 lb 3.7 oz) IBW/kg (Calculated) : 59.3 HEPARIN  DW (KG): 84.8  Vital Signs: Temp: 98.7 F (37.1 C) (11/11 0200) Temp Source: Axillary (11/11 0200) BP: 114/54 (11/11 0600) Pulse Rate: 71 (11/11 0600)  Labs: Recent Labs    07/17/24 0922 07/18/24 0514 07/18/24 1400 07/18/24 1957 07/19/24 0507  HGB  --  9.2*  --   --  8.7*  HCT  --  29.3*  --   --  28.0*  PLT  --  201  --   --  209  APTT  --   --  66* 76* 90*  LABPROT  --   --   --   --  17.5*  INR  --   --   --   --  1.4*  HEPARINUNFRC  --   --   --   --  >1.10*  CREATININE 0.63 0.52  --   --  0.51    Estimated Creatinine Clearance: 71.6 mL/min (by C-G formula based on SCr of 0.51 mg/dL).   Medical History: Past Medical History:  Diagnosis Date   Abnormality of gait    Acute on chronic diastolic CHF (congestive heart failure) (HCC) 11/20/2017   Acute respiratory failure with hypoxia (HCC) 06/13/2022   AKI (acute kidney injury) 03/03/2022   Breast cancer (HCC) 02/05/2005   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    Breast cancer (HCC) 2019   right breast   Chronic kidney disease, stage 3b (HCC) 09/10/2022   Complication of anesthesia 2006   pt states hard to wake up after L breast surgery    DVT (deep venous thrombosis) (HCC)    a. 03/2023 R PT Vein DVT following abd surgery-->s/p IVC filter.   Edema    GERD (gastroesophageal reflux disease)    Glucose intolerance 09/10/2022   Headache    Heart failure with improved ejection fraction (HFimpEF) (HCC)    a. 07/2011 Echo: EF 20-25%; b. 03/2017 Echo: EF 50-55%; c. 02/2022 Echo: EF 50-55%. mild LVH, GrI DD, Nl RV fxn, mild BAE, mild MR, mild AoV sclerosis, mild AI.   Heart murmur     Hypertension    Hypertensive emergency 06/13/2022   Lower limb ulcer, calf, left, limited to breakdown of skin (HCC) 12/11/2020   Malignant neoplasm of breast (female), unspecified site 06/14/2018   RIGHT T1b, N0; ER/PR positive, HER-2 negative.   Neuropathy    Obesity    Pain in joint, site unspecified    Personal history of chemotherapy 2006   left breast   Personal history of radiation therapy 2019   right breast   Personal history of radiation therapy 2006   left breast   Pneumonia    Pyuria 08/22/2022   Sleep apnea    Syncope 03/03/2022   Tinea pedis    Unspecified hearing loss    bilateral   Weakness 03/03/2022    Medications:  Last apixaban  dose on 11/9 at 2230  Assessment: [name] is a 56 yoF that presented on 10/26 with AMS and hypotension. Past medical history notable for CHF (EF 40-45% 10/21/23), CKD3b, PVD, brain injury, DVT s/p IV filter (03/2023), and malignant neoplasm of Breast s/p chemo and radiation.   Home apixaban  was resumed on 11/6. IR to place G-tube  tentatively on Jul 25, 2024. Anticoagulation plan is to bridge from apixaban  to heparin  gtt and hold heparin  2-4 hours prior to procedure. Post-procedure, resume with apixaban  or heparin .  Currently, patient in and out of atrial fibrillation.   Goal of Therapy:  Heparin  level 0.3-0.7 units/ml aPTT 66-102 seconds Monitor platelets by anticoagulation protocol: Yes   Plan:  11/11@0507 : aPTT 90 sec, therapeutic x 1 / HL >1.1 Continue heparin  infusion rate at 1200 units/hr Check aPTT daily w/ AM labs while therapeutic and heparin  level daily while on heparin  Will continue to dose via aPTT until correlation is seen, then will transition to heparin  level dosin Monitor CBC daily  Rankin [name], PharmD, [facility] 07/19/2024 6:13 AM

## 2024-07-19 NOTE — Progress Notes (Signed)
 Patients arms are edematous and weeping. Difficult to find area to access a PIV. ICU MD placed A-Line in left upper arm in vein with ultrasound so IV medications could be given.

## 2024-07-19 NOTE — Plan of Care (Signed)
 PMT is shadowing; updates provided by current attending and questions answered regarding previous discussions with family, which are documented in the notes.  Goals of care have been set for DNR, and full scope of treatment following extensive goals of care conversations with daughter and son.

## 2024-07-20 ENCOUNTER — Inpatient Hospital Stay: Admitting: Radiology

## 2024-07-20 DIAGNOSIS — J9601 Acute respiratory failure with hypoxia: Secondary | ICD-10-CM

## 2024-07-20 DIAGNOSIS — R092 Respiratory arrest: Secondary | ICD-10-CM | POA: Diagnosis not present

## 2024-07-20 DIAGNOSIS — N170 Acute kidney failure with tubular necrosis: Secondary | ICD-10-CM | POA: Diagnosis not present

## 2024-07-20 DIAGNOSIS — A4151 Sepsis due to Escherichia coli [E. coli]: Secondary | ICD-10-CM | POA: Diagnosis not present

## 2024-07-20 DIAGNOSIS — A419 Sepsis, unspecified organism: Secondary | ICD-10-CM | POA: Diagnosis not present

## 2024-07-20 DIAGNOSIS — T07XXXA Unspecified multiple injuries, initial encounter: Secondary | ICD-10-CM

## 2024-07-20 DIAGNOSIS — B379 Candidiasis, unspecified: Secondary | ICD-10-CM | POA: Diagnosis not present

## 2024-07-20 DIAGNOSIS — B9620 Unspecified Escherichia coli [E. coli] as the cause of diseases classified elsewhere: Secondary | ICD-10-CM | POA: Diagnosis not present

## 2024-07-20 DIAGNOSIS — N3080 Other cystitis without hematuria: Secondary | ICD-10-CM | POA: Diagnosis not present

## 2024-07-20 DIAGNOSIS — Z1612 Extended spectrum beta lactamase (ESBL) resistance: Secondary | ICD-10-CM | POA: Diagnosis not present

## 2024-07-20 DIAGNOSIS — R6521 Severe sepsis with septic shock: Secondary | ICD-10-CM | POA: Diagnosis not present

## 2024-07-20 DIAGNOSIS — R6520 Severe sepsis without septic shock: Secondary | ICD-10-CM

## 2024-07-20 LAB — COMPREHENSIVE METABOLIC PANEL WITH GFR
ALT: 60 U/L — ABNORMAL HIGH (ref 0–44)
AST: 70 U/L — ABNORMAL HIGH (ref 15–41)
Albumin: 2.3 g/dL — ABNORMAL LOW (ref 3.5–5.0)
Alkaline Phosphatase: 168 U/L — ABNORMAL HIGH (ref 38–126)
Anion gap: 8 (ref 5–15)
BUN: 61 mg/dL — ABNORMAL HIGH (ref 8–23)
CO2: 39 mmol/L — ABNORMAL HIGH (ref 22–32)
Calcium: 8.6 mg/dL — ABNORMAL LOW (ref 8.9–10.3)
Chloride: 104 mmol/L (ref 98–111)
Creatinine, Ser: 0.67 mg/dL (ref 0.44–1.00)
GFR, Estimated: >60 mL/min (ref >60)
Glucose, Bld: 134 mg/dL — ABNORMAL HIGH (ref 70–99)
Potassium: 4.5 mmol/L (ref 3.5–5.1)
Sodium: 150 mmol/L — ABNORMAL HIGH (ref 135–145)
Total Bilirubin: 0.4 mg/dL (ref 0.0–1.2)
Total Protein: 5.3 g/dL — ABNORMAL LOW (ref 6.5–8.1)

## 2024-07-20 LAB — HEPARIN LEVEL (UNFRACTIONATED): Heparin Unfractionated: 0.96 [IU]/mL — ABNORMAL HIGH (ref 0.30–0.70)

## 2024-07-20 LAB — APTT
aPTT: 96 s — ABNORMAL HIGH (ref 24–36)
aPTT: 68 s — ABNORMAL HIGH (ref 24–36)

## 2024-07-20 LAB — GLUCOSE, CAPILLARY
Glucose-Capillary: 100 mg/dL — ABNORMAL HIGH (ref 70–99)
Glucose-Capillary: 134 mg/dL — ABNORMAL HIGH (ref 70–99)
Glucose-Capillary: 123 mg/dL — ABNORMAL HIGH (ref 70–99)
Glucose-Capillary: 133 mg/dL — ABNORMAL HIGH (ref 70–99)
Glucose-Capillary: 70 mg/dL (ref 70–99)
Glucose-Capillary: 202 mg/dL — ABNORMAL HIGH (ref 70–99)

## 2024-07-20 LAB — CBC
HCT: 32.2 % — ABNORMAL LOW (ref 36.0–46.0)
Hemoglobin: 9.9 g/dL — ABNORMAL LOW (ref 12.0–15.0)
MCH: 28.3 pg (ref 26.0–34.0)
MCHC: 30.7 g/dL (ref 30.0–36.0)
MCV: 92 fL (ref 80.0–100.0)
Platelets: 236 K/uL (ref 150–400)
RBC: 3.5 MIL/uL — ABNORMAL LOW (ref 3.87–5.11)
RDW: 16 % — ABNORMAL HIGH (ref 11.5–15.5)
WBC: 10 K/uL (ref 4.0–10.5)
nRBC: 2.7 % — ABNORMAL HIGH (ref 0.0–0.2)

## 2024-07-20 LAB — CULTURE, BLOOD (SINGLE): Culture: NO GROWTH

## 2024-07-20 MED ORDER — MIDAZOLAM HCL 2 MG/2ML IJ SOLN
INTRAMUSCULAR | Status: AC
  Filled 2024-07-20: qty 2

## 2024-07-20 MED ORDER — OSMOLITE 1.5 CAL PO LIQD
1000.0000 mL | ORAL | Status: DC
  Administered 2024-07-20: 1000 mL

## 2024-07-20 MED ORDER — JUVEN PO PACK: 1.0000 | PACK | Freq: Two times a day (BID) | ORAL | Status: DC

## 2024-07-20 MED ORDER — HEPARIN (PORCINE) 25000 UT/250ML-% IV SOLN
1200.0000 [IU]/h | INTRAVENOUS | Status: DC
  Administered 2024-07-20: 1200 [IU]/h via INTRAVENOUS

## 2024-07-20 MED ORDER — GLUCAGON HCL RDNA (DIAGNOSTIC) 1 MG IJ SOLR
INTRAMUSCULAR | Status: AC
  Filled 2024-07-20: qty 1

## 2024-07-20 MED ORDER — GLUCAGON HCL RDNA (DIAGNOSTIC) 1 MG IJ SOLR
INTRAMUSCULAR | Status: AC | PRN
  Administered 2024-07-20: 1 mg via INTRAVENOUS

## 2024-07-20 MED ORDER — DEXAMETHASONE 0.5 MG PO TABS: 2.0000 mg | ORAL_TABLET | Freq: Every day | ORAL | Status: DC

## 2024-07-20 MED ORDER — FENTANYL CITRATE (PF) 100 MCG/2ML IJ SOLN
INTRAMUSCULAR | Status: AC
  Filled 2024-07-20: qty 2

## 2024-07-20 MED ORDER — LIDOCAINE HCL 1 % IJ SOLN
INTRAMUSCULAR | Status: AC
  Filled 2024-07-20: qty 20

## 2024-07-20 MED ORDER — MIDAZOLAM HCL (PF) 2 MG/2ML IJ SOLN
INTRAMUSCULAR | Status: AC | PRN
  Administered 2024-07-20 (×2): .5 mg via INTRAVENOUS

## 2024-07-20 MED ORDER — IOHEXOL 300 MG/ML  SOLN
12.0000 mL | Freq: Once | INTRAMUSCULAR | Status: AC | PRN
  Administered 2024-07-20: 12 mL

## 2024-07-20 MED ORDER — FREE WATER
200.0000 mL | Status: DC
  Administered 2024-07-20 (×4): 200 mL

## 2024-07-20 MED ORDER — COLLAGENASE 250 UNIT/GM EX OINT
TOPICAL_OINTMENT | Freq: Every day | CUTANEOUS | Status: DC
  Filled 2024-07-20: qty 30

## 2024-07-20 MED ORDER — LIDOCAINE HCL 1 % IJ SOLN
8.0000 mL | Freq: Once | INTRAMUSCULAR | Status: AC
  Administered 2024-07-20: 8 mL via INTRADERMAL

## 2024-07-20 MED ORDER — DEXAMETHASONE 4 MG PO TABS
4.0000 mg | ORAL_TABLET | Freq: Every day | ORAL | Status: DC
Start: 2024-07-22 — End: 2024-07-21

## 2024-07-20 MED ORDER — TRIPLE ANTIBIOTIC 3.5-400-5000 EX OINT
1.0000 | TOPICAL_OINTMENT | Freq: Every day | CUTANEOUS | Status: DC
Start: 2024-07-20 — End: 2024-07-21
  Administered 2024-07-20: 1 via CUTANEOUS
  Filled 2024-07-20: qty 1

## 2024-07-20 MED ORDER — ETOMIDATE 2 MG/ML IV SOLN
INTRAVENOUS | Status: AC
  Filled 2024-07-20: qty 10

## 2024-07-20 MED ORDER — FENTANYL CITRATE (PF) 100 MCG/2ML IJ SOLN
INTRAMUSCULAR | Status: AC | PRN
  Administered 2024-07-20 (×2): 25 ug via INTRAVENOUS

## 2024-07-20 MED ORDER — CEFAZOLIN SODIUM-DEXTROSE 2-4 GM/100ML-% IV SOLN
2.0000 g | INTRAVENOUS | Status: AC
  Administered 2024-07-20: 2 g via INTRAVENOUS

## 2024-07-20 MED ORDER — SODIUM CHLORIDE 0.9 % IV BOLUS
250.0000 mL | Freq: Once | INTRAVENOUS | Status: AC
  Administered 2024-07-20: 250 mL via INTRAVENOUS

## 2024-07-21 LAB — HSV 1/2 PCR (SURFACE)
HSV-1 DNA: NOT DETECTED
HSV-2 DNA: DETECTED — AB

## 2024-07-21 NOTE — Progress Notes (Signed)
 Un able to reach family son and daughters number called in chart multiple times.

## 2024-07-21 NOTE — Significant Event (Signed)
 Responded to pt room for alarm of  low oxygen saturations. Pts saturations in the 60s. Placed NRB on patient, respirations becoming agonal so code blue called. Patient has nsr on monitor but carotid pulses were weak and barely palpable. Doppler obtained, faint pulses heard on femoral area. NP Pepper, resp therapist, Dr Lawence, and nursing staff came to  bedside. Pt intubated by NP, with unknown foreign body extracted. See media for picture.  code meds given, see code blue sheet for doses and meds. No longer able to doppler pulses, ultrasound machine used to see if any cardiac wall motion present. None noted. Code called at 2350 by Dr. Lawence.

## 2024-07-22 LAB — YEAST SUSCEPTIBILITIES
Amphotericin B MIC: 0.5
Fluconazole Islt MIC: 1
ISAVUCONAZOLE MIC: 0.03
Itraconazole MIC: 0.06
Posaconazole MIC: 0.03

## 2024-07-23 LAB — CULTURE, BLOOD (ROUTINE X 2): Special Requests: ADEQUATE

## 2024-07-24 LAB — AEROBIC CULTURE W GRAM STAIN (SUPERFICIAL SPECIMEN): Gram Stain: NONE SEEN

## 2024-08-08 NOTE — Procedures (Signed)
Intubation Procedure Note  [name]  [id number]  1946/03/05  Date:07/11/2024  Time:12:17 AM   Provider Performing:Magddalene S Tukov-Yual    Procedure: Intubation (31500)  Indication(s) Respiratory Failure  Consent Unable to obtain consent due to emergent nature of procedure.   Anesthesia none  Time Out Verified patient identification, verified procedure, site/side was marked, verified correct patient position, special equipment/implants available, medications/allergies/relevant history reviewed, required imaging and test results available.   Sterile Technique Usual hand hygeine, masks, and gloves were used   Procedure Description Patient positioned in bed supine.  Sedation given as noted above.  Patient was intubated with endotracheal tube using Glidescope.  View was Grade 1 full glottis .  Number of attempts was 1.  Colorimetric CO2 detector was consistent with tracheal placement.   Complications/Tolerance None; patient tolerated the procedure well. Unable to obtain chest x-ray to confirm tube placement however tube placement was confirmed by calorimetry and breath sounds. Patient expired post-intubation   EBL Minimal   Specimen(s) None [name] S. Griffin, PhD, MSN, MPH, ANP-BC [facility] Prefer epic messenger for cross cover needs If after hours, please call E-link  NB: This document was prepared using Dragon voice recognition software and may include unintentional dictation errors.

## 2024-08-08 NOTE — Progress Notes (Signed)
 Pt NPO at midnight pending G-tube with IR in AM.

## 2024-08-08 NOTE — Progress Notes (Addendum)
Daily Progress Note   Patient Name: [name]       Date: 2024/08/02 DOB: 1946-06-15  Age: 78 y.o. MRN#: [id number] Attending Physician: Marsa Edelman, DO Primary Care Physician: Lorel Maxie LABOR, MD Admit Date: 07/03/2024  Reason for Consultation/Follow-up: Establishing goals of care  Subjective: Updates discussed with oncoming attending, and with CCM RN team.  A-line placed by CCM yesterday evening for IV access.  Patient had successful placement of PEG tube today.  Upon my entry, patient is being turned, staff is taking updated pictures, and ID is assessing wounds, and culturing wounds.  Patient moans with some movement.  No family at bedside.  Length of Stay: 17  Current Medications: Scheduled Meds:   vitamin C   500 mg Per Tube BID   Chlorhexidine  Gluconate Cloth  6 each Topical Daily   [START ON 08/06/2024] dexamethasone   4 mg Per Tube Daily   Followed by   NOREEN ON 07/29/2024] dexamethasone   2 mg Per Tube Daily   dexamethasone   6 mg Per Tube Daily   feeding supplement (PROSource TF20)  60 mL Per Tube Daily   free water  200 mL Per Tube Q2H   furosemide   40 mg Intravenous Q6H   Gerhardt's butt cream   Topical BID   insulin  aspart  0-15 Units Subcutaneous Q4H   insulin  aspart  3 Units Subcutaneous Q4H   metoprolol  tartrate  12.5 mg Per Tube BID   neomycin-bacitracin-polymyxin  1 Application Apply externally QHS   [START ON 07/10/2024] nutrition supplement (JUVEN)  1 packet Per Tube BID BM   mouth rinse  15 mL Mouth Rinse 4 times per day   pantoprazole  (PROTONIX ) IV  40 mg Intravenous Q24H   senna-docusate  1 tablet Per NG tube QHS    Continuous Infusions:  feeding supplement (OSMOLITE 1.5 CAL)     fluconazole (DIFLUCAN) IV Stopped (08-02-24 0715)   heparin  1,200  Units/hr (08-02-24 1520)    PRN Meds: dextrose , docusate sodium , Influenza vac split trivalent PF, ipratropium-albuterol , metoprolol  tartrate, morphine  injection, mouth rinse, oxyCODONE , polyethylene glycol  Physical Exam Constitutional:      Comments: Eyes closed  Pulmonary:     Effort: Pulmonary effort is normal.  Skin:    Comments: See photos uploaded by CCM and or ID  Vital Signs: BP 117/80   Pulse 99   Temp 97.6 F (36.4 C) (Axillary)   Resp 13   Ht 5' 6 (1.676 m)   Wt 128.3 kg   SpO2 94%   BMI 45.65 kg/m  SpO2: SpO2: 94 % O2 Device: O2 Device: Room Air O2 Flow Rate: O2 Flow Rate (L/min): 2 L/min  Intake/output summary:  Intake/Output Summary (Last 24 hours) at 04-Aug-2024 1522 Last data filed at 04-Aug-2024 1228 Gross per 24 hour  Intake 2198.85 ml  Output 2575 ml  Net -376.15 ml   LBM: Last BM Date : Aug 04, 2024 Baseline Weight: Weight: 109.6 kg Most recent weight: Weight: 128.3 kg  Patient Active Problem List   Diagnosis Date Noted   Candidemia (HCC) 07/15/2024   Sepsis (HCC) 07/03/2024   Lower urinary tract infectious disease 07/03/2024   Hypokalemia 04/24/2023   Acute postoperative anemia due to expected blood loss 04/22/2023   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   Hypernatremia 04/20/2023   Acute metabolic encephalopathy 04/20/2023   Hypoglycemia 04/19/2023   Aspiration pneumonia of right lower lobe due to vomit (HCC) 04/19/2023   SBO (small bowel obstruction) (HCC) 04/18/2023   Coffee ground emesis 04/18/2023   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/18/2023   Peripheral neuropathy 04/18/2023   Acute DVT of right tibial vein (HCC) 03/30/2023   Facial weakness 03/30/2023   DVT (deep venous thrombosis) (HCC) 03/30/2023   Mouth droop due to facial weakness 03/29/2023   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) 03/29/2023   Cellulitis of lower extremity 09/10/2022   Near syncope 08/22/2022    Constipation 08/22/2022   Stage 3a chronic kidney disease (HCC) 06/13/2022   Ulcers of both lower extremities, with unspecified severity (HCC) 03/03/2022   Obesity (BMI 30-39.9) 03/03/2022   Anemia of chronic disease 03/03/2022   PAD (peripheral artery disease) 05/30/2020   Malignant neoplasm of right female breast (HCC) 05/13/2018   Venous insufficiency of both lower extremities 11/20/2017   Chronic diastolic CHF (congestive heart failure) (HCC) 11/20/2017   Weight gain 11/20/2017   Lymphedema 04/19/2017   Benign essential HTN 04/19/2017   OSA (obstructive sleep apnea) 04/19/2017   Morbid obesity (HCC) 04/19/2017   TIA (transient ischemic attack) 03/28/2017   Urinary incontinence 06/27/2016   Breast cancer (HCC) 09/08/2004    Palliative Care Assessment & Plan   Recommendations/Plan: Continuing DNR with full scope  Code Status:    Code Status Orders  (From admission, onward)           Start     Ordered   07/11/24 1401  Do not attempt resuscitation (DNR) Pre-Arrest Interventions Desired  (Code Status)  Continuous       Question Answer Comment  If pulseless and not breathing No CPR or chest compressions.   In Pre-Arrest Conditions (Patient Has Pulse and Is Breathing) May intubate, use advanced airway interventions and cardioversion/ACLS medications if appropriate or indicated. May transfer to ICU.   Consent: Discussion documented in EHR or advanced directives reviewed      07/11/24 1400           Code Status History     Date Active Date Inactive Code Status Order ID Comments User Context   07/03/2024 1339 07/11/2024 1400 Full Code [id number]  Maranda Lonell MATSU, NP ED   04/18/2023 0322 04/28/2023 1907 Full Code [id number]  Mansy, Madison LABOR, MD ED   03/29/2023 1955 03/31/2023 2043 Full Code [id number]  Tobie Mario GAILS, MD ED  09/10/2022 1318 09/16/2022 1911 Full Code [id number]  Celinda Alm Lot, MD ED   08/22/2022 1541 08/23/2022 2011 Full Code [id number]  Cox, Amy LOISE, DO ED    06/13/2022 0404 06/15/2022 1820 Full Code [id number]  Cleatus Delayne GAILS, MD ED   03/03/2022 [phone] 03/07/2022 1523 Full Code [id number]  Debby Camila LABOR, MD ED   03/28/2017 0418 03/28/2017 2122 Full Code [id number]  Lonzo Pollen, MD Inpatient      Advance Directive Documentation    Flowsheet Row Most Recent Value  Type of Advance Directive Out of facility DNR (pink MOST or yellow form)  Pre-existing out of facility DNR order (yellow form or pink MOST form) Pink MOST form placed in chart (order not valid for inpatient use)  MOST Form in Place? --   Camelia Lewis, NP  Please contact Palliative Medicine Team phone at [phone] for questions and concerns.
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
## 2024-08-08 NOTE — Procedures (Signed)
 Interventional Radiology Procedure Note  Procedure: G tube placement  Complications: None  Estimated Blood Loss: < 10 mL  Findings: 18Fr percutaneous G tube placement.  Cordella DELENA Banner, MD

## 2024-08-08 NOTE — Progress Notes (Signed)
 During wound change today Dr. Fayette came in and viewed the wounds. We took new pictures. Patients wounds are worse and wound care is painful for patient even with pain medication given. MD aware and made son aware.

## 2024-08-08 NOTE — Consult Note (Signed)
PULMONARY / CRITICAL CARE MEDICINE  Name: [name] MRN: [id number] DOB: 1946/05/06    LOS: 18  Referring Provider: Dr. Lawence Reason for Referral: Acute respiratory failure with hypoxemia Brief patient description: This is 78 year old female with a prolonged and complicated hospitalization now presenting with acute hypoxic respiratory failure requiring intubation  HPI: This is a 78 year old female with a medical history as indicated below.  She has had a prolonged and complicated hospital course.  PCCM is consulted STAT for intubation.  Patient is a partial code with okay to intubate but no CPR.  Patient developed respiratory arrest and CODE BLUE was called at 2326.  Patient was intubated at 2340.  Resuscitative measures, patient remained in asystole even post intubation. 2350 code called and patient pronounced.  Message left for family  Past Medical History:  Diagnosis Date   Abnormality of gait    Acute on chronic diastolic CHF (congestive heart failure) (HCC) 11/20/2017   Acute respiratory failure with hypoxia (HCC) 06/13/2022   AKI (acute kidney injury) 03/03/2022   Breast cancer (HCC) 02/05/2005   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    Breast cancer (HCC) 2019   right breast   Chronic kidney disease, stage 3b (HCC) 09/10/2022   Complication of anesthesia 2006   pt states hard to wake up after L breast surgery    DVT (deep venous thrombosis) (HCC)    a. 03/2023 R PT Vein DVT following abd surgery-->s/p IVC filter.   Edema    GERD (gastroesophageal reflux disease)    Glucose intolerance 09/10/2022   Headache    Heart failure with improved ejection fraction (HFimpEF) (HCC)    a. 07/2011 Echo: EF 20-25%; b. 03/2017 Echo: EF 50-55%; c. 02/2022 Echo: EF 50-55%. mild LVH, GrI DD, Nl RV fxn, mild BAE, mild MR, mild AoV sclerosis, mild AI.   Heart murmur    Hypertension    Hypertensive  emergency 06/13/2022   Lower limb ulcer, calf, left, limited to breakdown of skin (HCC) 12/11/2020   Malignant neoplasm of breast (female), unspecified site 06/14/2018   RIGHT T1b, N0; ER/PR positive, HER-2 negative.   Neuropathy    Obesity    Pain in joint, site unspecified    Personal history of chemotherapy 2006   left breast   Personal history of radiation therapy 2019   right breast   Personal history of radiation therapy 2006   left breast   Pneumonia    Pyuria 08/22/2022   Sleep apnea    Syncope 03/03/2022   Tinea pedis    Unspecified hearing loss    bilateral   Weakness 03/03/2022   Past Surgical History:  Procedure Laterality Date   BREAST BIOPSY Right 2019   11 oc-INVASIVE MAMMARY CARCINOMA, NO SPECIAL TYPE   BREAST BIOPSY Right 2019   1030 oc- neg   BREAST LUMPECTOMY  2006   left breast    CATARACT EXTRACTION     left eye   HERNIA REPAIR  05/25/2006   Incarcerated omentum in ventral hernia, Ventralight mesh, combined lap/ open with omental resection.    IR GASTROSTOMY TUBE MOD SED  08/18/24   IVC FILTER INSERTION N/A 03/30/2023   Procedure: IVC FILTER INSERTION;  Surgeon: Marea Selinda RAMAN, MD;  Location: [facility];  Service: Cardiovascular;  Laterality: N/A;   LAPAROTOMY N/A 04/20/2023   Procedure: EXPLORATORY LAPAROTOMY, LYSIS OF ADHESIONS WITH  MESSENTARIC BIOPSY AND REMOVAL OF MESH WITH METALLIC  TACKS;  Surgeon: Rodolph Romano, MD;  Location: [facility];  Service: General;  Laterality: N/A;   PARTIAL MASTECTOMY WITH NEEDLE LOCALIZATION Right 06/14/2018   Procedure: PARTIAL MASTECTOMY WITH NEEDLE LOCALIZATION;  Surgeon: Dessa Reyes ORN, MD;  Location: [facility] OR[facility];  Service: General;  Laterality: Right;   PORT-A-CATH REMOVAL  05/25/2006   SENTINEL NODE BIOPSY Right 06/14/2018   Procedure: SENTINEL NODE BIOPSY;  Surgeon: Dessa Reyes ORN, MD;  Location: [facility];  Service: General;  Laterality: Right;   TUBAL LIGATION     No current  facility-administered medications on file prior to encounter.   Current Outpatient Medications on File Prior to Encounter  Medication Sig   acetaminophen  (TYLENOL ) 325 MG tablet Take 2 tablets (650 mg total) by mouth every 6 (six) hours as needed for mild pain (or Fever >/= 101).   anastrozole  (ARIMIDEX ) 1 MG tablet Take 1 tablet (1 mg total) by mouth daily.   brimonidine (ALPHAGAN) 0.2 % ophthalmic solution Place 1 drop into both eyes 3 (three) times daily.   Calcium  Carbonate-Vitamin D  600-5 MG-MCG TABS Take 1 tablet by mouth 2 (two) times daily.   Carboxymethylcellulose Sodium 1 % GEL Apply 1 drop to eye 2 (two) times daily. Wait 5 minutes between eye drops   dexamethasone  (DECADRON ) 4 MG tablet Take 1 tablet (4 mg total) by mouth 2 (two) times daily with a meal.   Docusate Sodium  100 MG capsule Take 100 mg by mouth 2 (two) times daily.   ELIQUIS  5 MG TABS tablet Take 5 mg by mouth 2 (two) times daily.   Emollient (EUCERIN) lotion Apply topically 2 (two) times daily. Apply to dry skin especially bilateral lower extremities and arms. (Patient taking differently: Apply 1 Application topically 3 (three) times daily. Apply to dry skin especially bilateral lower extremities and arms.)   empagliflozin  (JARDIANCE ) 10 MG TABS tablet Take 1 tablet (10 mg total) by mouth daily before breakfast.   HYDROcodone -acetaminophen  (NORCO/VICODIN) 5-325 MG tablet Take 1 tablet by mouth every 6 (six) hours as needed.   hydrOXYzine (ATARAX) 25 MG tablet Take 25 mg by mouth every 4 (four) hours as needed for itching.   ipratropium-albuterol  (DUONEB) 0.5-2.5 (3) MG/3ML SOLN Take 3 mLs by nebulization every 6 (six) hours as needed (wheezing).   isosorbide  mononitrate (IMDUR ) 60 MG 24 hr tablet Take 1 tablet (60 mg total) by mouth 2 (two) times daily.   lactulose (CHRONULAC) 10 GM/15ML solution Take 10 g by mouth daily.   latanoprost (XALATAN) 0.005 % ophthalmic solution Place 1 drop into both eyes at bedtime.    loratadine  (CLARITIN ) 10 MG tablet Take 10 mg by mouth daily.   Menthol-Zinc  Oxide 0.44-20.625 % OINT Apply 1 Application topically daily. Apply to healed areas to L glute/L groin   metFORMIN (GLUCOPHAGE) 1000 MG tablet Take 1,000 mg by mouth daily.   Multiple Vitamin (QUINTABS) TABS Take 1 tablet by mouth daily.   naloxone (NARCAN) nasal spray 4 mg/0.1 mL Place 1 spray into the nose once.   neomycin-polymyxin b-dexamethasone  (MAXITROL) 3.5-10000-0.1 SUSP Place 1 drop into both eyes 4 (four) times daily.   nystatin (MYCOSTATIN/NYSTOP) powder Apply 1 Application topically 2 (two) times daily. Bilateral abdominal folds and perineal area   omeprazole (PRILOSEC) 40 MG capsule Take 40 mg by mouth 2 (two) times daily.   ondansetron  (ZOFRAN -ODT) 4 MG disintegrating tablet Take 4 mg by mouth every 8 (eight) hours as needed.   oxybutynin  (DITROPAN ) 5 MG tablet Take 5 mg by mouth 2 (two)  times daily.   Oxycodone  HCl 10 MG TABS Take 10 mg by mouth daily as needed.   polyethylene glycol (MIRALAX  / GLYCOLAX ) 17 g packet Take 17 g by mouth daily. Skip the dose if no constipation (Patient taking differently: Take 17 g by mouth 2 (two) times daily.)   potassium chloride  (MICRO-K ) 10 MEQ CR capsule Take 10 mEq by mouth daily.   pregabalin  (LYRICA ) 75 MG capsule Take 75 mg by mouth daily.   senna (SENOKOT) 8.6 MG TABS tablet Take 1 tablet by mouth 2 (two) times daily.   torsemide  (DEMADEX ) 20 MG tablet Take 1 tablet (20 mg total) by mouth daily. (Patient taking differently: Take 20 mg by mouth 2 (two) times daily.)   Vitamin D , Ergocalciferol , (DRISDOL) 1.25 MG (50000 UNIT) CAPS capsule Take 50,000 Units by mouth once a week.   Zinc  Oxide (TRIPLE PASTE) 12.8 % ointment Apply barrier cream to buttocks every shift.   ascorbic acid  (VITAMIN C ) 500 MG tablet Take 1 tablet by mouth 2 (two) times daily. (Patient not taking: Reported on 07/03/2024)   bisacodyl  (DULCOLAX) 10 MG suppository Place 10 mg rectally daily as  needed for moderate constipation. No BM for 2 days   diclofenac  Sodium (VOLTAREN ) 1 % GEL Apply 2 g topically 2 (two) times daily as needed.   feeding supplement (ENSURE ENLIVE / ENSURE PLUS) LIQD Take 237 mLs by mouth daily. (Patient not taking: Reported on 10/01/2023)   Incontinence Supply Disposable (BLADDER CONTROL PADS EX ABSORB) MISC Apply 1 Dose topically as needed.    pantoprazole  (PROTONIX ) 40 MG tablet Take 1 tablet (40 mg total) by mouth 2 (two) times daily. (Patient not taking: Reported on 07/03/2024)    Allergies No Known Allergies  Family History Family History  Problem Relation Age of Onset   Hypertension Mother        deceased 72   Cataracts Mother    Breast cancer Other 70       maternal half-sister; deceased 39   Coronary artery disease Other    Social History  reports that she quit smoking about 24 years ago. Her smoking use included cigarettes. She started smoking about 54 years ago. She has a 15 pack-year smoking history. She has never used smokeless tobacco. She reports that she does not currently use drugs after having used the following drugs: Marijuana. She reports that she does not drink alcohol.  Review Of Systems: Unable to obtain  VITAL SIGNS: BP (!) 115/48   Pulse 92   Temp (!) 97.3 F (36.3 C)   Resp 15   Ht 5' 6 (1.676 m)   Wt 128.3 kg   SpO2 100%   BMI 45.65 kg/m   HEMODYNAMICS:    VENTILATOR SETTINGS:    INTAKE / OUTPUT: I/O last 3 completed shifts: In: 3897.9 [I.V.:347.1; NG/GT:2789.8; IV Piggyback:761] Out: 3950 [Urine:3550; Stool:400]  PHYSICAL EXAMINATION: General: Acutely ill looking HEENT: Pupils are dilated and fixed and unresponsive Neuro: Unresponsive to voice, and noxious stimulus, intermittent agonal breaths Cardiovascular: Apical pulse irregular, S1-S2, no murmur or regurg Lungs: Significantly diminished breath sounds in all lung fields Abdomen: Profound central obesity with hypoactive bowel sounds Musculoskeletal:  Positive range of motion in upper and lower extremities Skin: Hold, clammy and cyanotic  LABS:  BMET Recent Labs  Lab 07/18/24 0514 07/19/24 0507 08/18/24 0602  NA 147* 148* 150*  K 4.2 4.0 4.5  CL 104 101 104  CO2 32 36* 39*  BUN 62* 65* 61*  CREATININE 0.52  0.51 0.67  GLUCOSE 178* 224* 134*    Electrolytes Recent Labs  Lab 07/14/24 0525 07/15/24 0510 07/16/24 0420 07/17/24 0922 07/18/24 0514 07/19/24 0507 Aug 04, 2024 0602  CALCIUM  7.8* 7.6* 7.6* 7.8* 8.0* 7.9* 8.6*  MG 2.2 2.2 2.2  --   --   --   --   PHOS 2.4* 2.5  --  2.7  --   --   --     CBC Recent Labs  Lab 07/18/24 0514 07/19/24 0507 Aug 04, 2024 0602  WBC 11.0* 9.6 10.0  HGB 9.2* 8.7* 9.9*  HCT 29.3* 28.0* 32.2*  PLT 201 209 236    Coag's Recent Labs  Lab 07/19/24 0507 August 04, 2024 0602 2024/08/04 2213  APTT 90* 96* 68*  INR 1.4*  --   --     Sepsis Markers No results for input(s): LATICACIDVEN, PROCALCITON, O2SATVEN in the last 168 hours.  ABG No results for input(s): PHART, PCO2ART, PO2ART in the last 168 hours.  Liver Enzymes Recent Labs  Lab 07/19/24 0507 2024-08-04 0602  AST  --  70*  ALT  --  60*  ALKPHOS  --  168*  BILITOT  --  0.4  ALBUMIN 1.5* 2.3*    Cardiac Enzymes No results for input(s): TROPONINI, PROBNP in the last 168 hours.  Glucose Recent Labs  Lab August 04, 2024 0326 August 04, 2024 0743 08-04-24 0911 Aug 04, 2024 1140 08-04-24 1636 08-04-24 1942  GLUCAP 100* 134* 123* 133* 70 202*    Imaging IR GASTROSTOMY TUBE MOD SED Result Date: 08/04/2024 INDICATION: Altered mental status (acute metabolic encephalopathy). Placement of percutaneous gastrostomy tube for nutritional support. EXAM: Percutaneous gastrostomy tube placement MEDICATIONS: Antibiotics were administered within 1 hour of the procedure. Glucagon 1 mg IV ANESTHESIA/SEDATION: Moderate (conscious) sedation was employed during this procedure. A total of Versed  1 mg and Fentanyl  50 mcg was administered  intravenously by the radiology nurse. Total intra-service moderate Sedation Time: 18 minutes. The patient's level of consciousness and vital signs were monitored continuously by radiology nursing throughout the procedure under my direct supervision. CONTRAST:  12 mL Omnipaque  300-administered into the gastric lumen. FLUOROSCOPY: Radiation Exposure Index (as provided by the fluoroscopic device): 53 mGy Kerma COMPLICATIONS: None immediate. PROCEDURE: Informed written consent was obtained from the patient's POA after a thorough discussion of the procedural risks, benefits and alternatives. All questions were addressed. Maximal Sterile Barrier Technique was utilized including caps, mask, sterile gowns, sterile gloves, sterile drape, hand hygiene and skin antiseptic. A timeout was performed prior to the initiation of the procedure. In a supine position the epigastric region was prepped and draped in usual sterile fashion. The patient arrived with an indwelling nasogastric tube which was used to insufflate the stomach with room air. Under fluoroscopic guidance, the skin was marked for gastropexy suture deployment. Subcutaneous tissue was infiltrated with 1% lidocaine  for local anesthesia. Two gastropexy sutures were deployed in sequential fashion by advancing the pre loaded in needle through a tiny incision in the epigastric region until needle tip was identified within the stomach under fluoroscopic guidance. Contrast dripped onto the dorsal wall of the stomach verified on fluoroscopy. A larger incision was then made in the epigastric region and a 10 cm Yueh needle was advanced from the skin into the stomach lumen. Intraluminal position was again verified by withdrawing gas into a syringe and tripping contrast onto the dorsal wall of the stomach. Access was exchanged for an Amplatz guidewire which was advanced to the pyloric region under fluoroscopy. An 8 mm x 10 cm balloon was then  loaded with the 72 French gastrostomy  tube and advanced into the incision over the guidewire. Balloon dilatation of the access site was then performed. The balloon was deflated as the gastric tube was advanced over the catheter and guidewire. Retention balloon inflated with 10 mL normal saline. Balloon catheter and guidewire removed. Dilute contrast injected into the gastrostomy tube demonstrated satisfactory position. The gastric tube was then flushed with normal saline. IMPRESSION: Satisfactory placement of an 76 French percutaneous gastrostomy tube as described above. Gastropexy sutures will be removed in approximately 2-3 weeks. Electronically Signed   By: Cordella Banner   On: 2024/08/12 16:29    DISCUSSION: 78 year old female with severe sepsis secondary to multiple wounds, now in acute acute respiratory arrest  ASSESSMENT / PLAN:  PULMONARY A: Acute respiratory arrest requiring emergent intubation P:   Patient intubated.  During intubation, copious amount of foul-smelling secretions and what appears to be old food found in patient's oral cavity.  Oral cavity cleared and patient intubated without difficulty. Unable to obtain any SpO2 readings Patient received 2 A of bicarb and 3 doses of epinephrine  and 1 dose of atropine No cardiac activity on bedside echo Patient pronounced Multiple messages left for family to call back  Dilan Novosad S. [facility] ANP-BC Pulmonary and Critical Care Medicine [facility] Pager [phone] or [phone]  NB: This document was prepared using Dragon voice recognition software and may include unintentional dictation errors.    07/14/2024, 12:18 AM

## 2024-08-08 NOTE — Plan of Care (Signed)
  Problem: Nutrition: Goal: Adequate nutrition will be maintained Outcome: Progressing   Problem: Safety: Goal: Ability to remain free from injury will improve Outcome: Progressing   

## 2024-08-08 NOTE — Consult Note (Addendum)
 Brief Pharmacy Note  Pharmacy consulted for anticoagulation review post G-tube placement. This procedure was identified as standard bleeding risk. Patient is on heparin  infusion in place of home Eliquis  for Afib and history of VTE.  Protocol recommends re-starting heparin  infusion 6 hours post-procedurally for a standard bleeding risk procedure. However, per discussion with IR, ok to re-start heparin  2 hours after the procedure at previous rate with no bolus. Previous IR note says to re-start heparin  4 hours post-procedure. Orders adjusted for heparin  to re-start about 4 hours post-procedure.  [name] Aug 17, 2024

## 2024-08-08 NOTE — ED Provider Notes (Signed)
Code Blue CONSULT NOTE  Chief Complaint: Unresponsive  History of present illness: I was contacted by the hospital for a CODE BLUE upstairs and presented to the patient's bedside.    Initial report that patient was unresponsive but with pulse present. Code status DNR.   Physical Exam Gen: unresponsive Cardiovascular: pulse present  Resp: getting bagged  MDM / Assessment and Plan 78 year old female who was noted, unresponsive leading to CODE BLUE activation.  Pulse present at the time I presented to bedside.  Hospitalist MD Mansy and ICU NP Tukov-Yual present with plan for intubation.  They did not request further ER assistance with this patient.  Further management per these teams.    Levander Slate, MD 07/12/2024 [phone]

## 2024-08-08 NOTE — Death Summary Note (Signed)
DEATH SUMMARY   Patient Details  Name: [name] MRN: [id number] DOB: 29-Dec-1945  Admission/Discharge Information   Admit Date:  2024-07-10  Date of Death: Date of Death: 07-27-24  Time of Death: Time of Death: July 31, 2349  Length of Stay: 08/02/24  Referring Physician: Lorel Maxie LABOR, MD   Reason(s) for Hospitalization  Sepsis and septic shock, respiratory failure  Diagnoses  Preliminary cause of death: Respiratory arrest Secondary Diagnoses (including complications and co-morbidities):  Principal Problem:   Septic shock (HCC) Active Problems:   Acute hypoxic respiratory failure (HCC)   Lower urinary tract infectious disease   Candidemia (HCC)   Wounds, multiple   Brief Hospital Course (including significant findings, care, treatment, and services provided and events leading to death)  [name] is a 78 y.o. year old female with a prolonged hospital course that included respiratory failure, sepsis and multiorgan failure.  Patient went into respiratory arrest was intubated but went into cardiac arrest with intubation.  Patient was a DNR but okay with intubation  Pertinent Labs and Studies  Significant Diagnostic Studies IR GASTROSTOMY TUBE MOD SED Result Date: 07-27-24 INDICATION: Altered mental status (acute metabolic encephalopathy). Placement of percutaneous gastrostomy tube for nutritional support. EXAM: Percutaneous gastrostomy tube placement MEDICATIONS: Antibiotics were administered within 1 hour of the procedure. Glucagon 1 mg IV ANESTHESIA/SEDATION: Moderate (conscious) sedation was employed during this procedure. A total of Versed  1 mg and Fentanyl  50 mcg was administered intravenously by the radiology nurse. Total intra-service moderate Sedation Time: 18 minutes. The patient's level of consciousness and vital signs were monitored continuously by radiology nursing throughout the procedure under my direct supervision. CONTRAST:  12 mL Omnipaque  300-administered into  the gastric lumen. FLUOROSCOPY: Radiation Exposure Index (as provided by the fluoroscopic device): 53 mGy Kerma COMPLICATIONS: None immediate. PROCEDURE: Informed written consent was obtained from the patient's POA after a thorough discussion of the procedural risks, benefits and alternatives. All questions were addressed. Maximal Sterile Barrier Technique was utilized including caps, mask, sterile gowns, sterile gloves, sterile drape, hand hygiene and skin antiseptic. A timeout was performed prior to the initiation of the procedure. In a supine position the epigastric region was prepped and draped in usual sterile fashion. The patient arrived with an indwelling nasogastric tube which was used to insufflate the stomach with room air. Under fluoroscopic guidance, the skin was marked for gastropexy suture deployment. Subcutaneous tissue was infiltrated with 1% lidocaine  for local anesthesia. Two gastropexy sutures were deployed in sequential fashion by advancing the pre loaded in needle through a tiny incision in the epigastric region until needle tip was identified within the stomach under fluoroscopic guidance. Contrast dripped onto the dorsal wall of the stomach verified on fluoroscopy. A larger incision was then made in the epigastric region and a 10 cm Yueh needle was advanced from the skin into the stomach lumen. Intraluminal position was again verified by withdrawing gas into a syringe and tripping contrast onto the dorsal wall of the stomach. Access was exchanged for an Amplatz guidewire which was advanced to the pyloric region under fluoroscopy. An 8 mm x 10 cm balloon was then loaded with the 18 French gastrostomy tube and advanced into the incision over the guidewire. Balloon dilatation of the access site was then performed. The balloon was deflated as the gastric tube was advanced over the catheter and guidewire. Retention balloon inflated with 10 mL normal saline. Balloon catheter and guidewire removed.  Dilute contrast injected into the gastrostomy tube demonstrated satisfactory position. The gastric tube  was then flushed with normal saline. IMPRESSION: Satisfactory placement of an 85 French percutaneous gastrostomy tube as described above. Gastropexy sutures will be removed in approximately 2-3 weeks. Electronically Signed   By: Cordella Banner   On: 2024-08-15 16:29   DG Chest Port 1 View Result Date: 07/16/2024 EXAM: 1 VIEW(S) XRAY OF THE CHEST 07/16/2024 10:34:55 PM COMPARISON: 07/03/2024 CLINICAL HISTORY: Shortness of breath FINDINGS: LINES, TUBES AND DEVICES: Feeding tube coursing below the diaphragm. LUNGS AND PLEURA: Low lung volumes. Mild bibasilar atelectasis. Small right pleural effusion. No focal pulmonary opacity. No pulmonary edema. No pneumothorax. HEART AND MEDIASTINUM: Stable mild cardiomegaly. BONES AND SOFT TISSUES: No acute osseous abnormality. IMPRESSION: 1. Low lung volumes with mild bibasilar atelectasis and small right pleural effusion. Electronically signed by: Pinkie Pebbles MD 07/16/2024 10:41 PM EST RP Workstation: [id number]   DG Naso G Tube Plc W/Fl W/Rad Result Date: 07/10/2024 INDICATION: History of dysphagia with malnutrition and poor oral intake. EXAM: NASO G TUBE PLACEMENT WITH FL AND WITH RAD FLUOROSCOPY TIME:  Radiation Exposure Index (as provided by the fluoroscopic device): 17.6 mGy Kerma COMPLICATIONS: None immediate PROCEDURE: The Dobhoff tube was lubricated with viscous lidocaine  inserted into the left nostril. Under intermittent fluoroscopic guidance, the Dobhoff tube was advanced into the stomach. Several attempts were made to advance the tip past the pyloric sphincter and into the duodenum; however, this was unfortunately not achievable. A small amount of additional length was left in the stomach in hopes that peristalsis will advance the tube. A spot fluoroscopic image was saved for documentation purposes. The tube was affixed to the patient's nose with tape,  but due to patient's excessive movement and attempts to grab the tube, the decision was made to bridle the Dobbhoff while the patient remained of fluoro. The tube is currently bridled at approximately 68 cm. The patient tolerated the procedure well without immediate postprocedural complication. IMPRESSION: Successful fluoroscopic guided placement of Dobhoff tube with tip terminating near the pyloric sphincter. The tube is ready for immediate use. This exam was performed by [facility] PA-C, and was supervised and interpreted by Dr. Ree Molt. Electronically Signed   By: Ree Molt M.D.   On: 07/10/2024 11:41   ECHOCARDIOGRAM COMPLETE Result Date: 07/05/2024    ECHOCARDIOGRAM REPORT   Patient Name:   KASEE HANTZ Date of Exam: 07/05/2024 Medical Rec #:  [id number]        Height:       66.0 in Accession #:    [id number]       Weight:       248.0 lb Date of Birth:  [birth year]        BSA:          2.191 m Patient Age:    78 years         BP:           128/60 mmHg Patient Gender: F                HR:           132 bpm. Exam Location:  [facility] Procedure: 2D Echo, Cardiac Doppler and Color Doppler (Both Spectral and Color            Flow Doppler were utilized during procedure). Indications:     Shock R57.9  History:         Patient has prior history of Echocardiogram examinations, most  recent 10/20/2023. Arrythmias:Atrial Fibrillation.  Sonographer:     Ashley McNeely-Sloane Referring Phys:  [id number] INGE JONETTA LECHER Diagnosing Phys: Lonni Hanson MD  Sonographer Comments: Technically difficult study due to poor echo windows. Image acquisition challenging due to uncooperative patient. IMPRESSIONS  1. Left ventricular ejection fraction, by estimation, is 35 to 40%. The left ventricle has moderately decreased function. Left ventricular endocardial border not optimally defined to evaluate regional wall motion. The left ventricular internal cavity size was mildly dilated. Left ventricular  diastolic function could not be evaluated.  2. Right ventricular systolic function is moderately reduced. The right ventricular size is moderately enlarged. There is mildly elevated pulmonary artery systolic pressure.  3. The mitral valve is grossly normal. No evidence of mitral valve regurgitation. No evidence of mitral stenosis.  4. Tricuspid valve regurgitation is mild to moderate.  5. The aortic valve was not well visualized. Aortic valve regurgitation is trivial. Aortic valve sclerosis is present, with no evidence of aortic valve stenosis.  6. The inferior vena cava is normal in size with <50% respiratory variability, suggesting right atrial pressure of 8 mmHg. FINDINGS  Left Ventricle: Left ventricular ejection fraction, by estimation, is 35 to 40%. The left ventricle has moderately decreased function. Left ventricular endocardial border not optimally defined to evaluate regional wall motion. The left ventricular internal cavity size was mildly dilated. There is borderline left ventricular hypertrophy. Left ventricular diastolic function could not be evaluated due to atrial fibrillation. Left ventricular diastolic function could not be evaluated. Right Ventricle: The right ventricular size is moderately enlarged. No increase in right ventricular wall thickness. Right ventricular systolic function is moderately reduced. There is mildly elevated pulmonary artery systolic pressure. The tricuspid regurgitant velocity is 2.81 m/s, and with an assumed right atrial pressure of 8 mmHg, the estimated right ventricular systolic pressure is 39.6 mmHg. Left Atrium: Left atrial size was normal in size. Right Atrium: Right atrial size was normal in size. Pericardium: There is no evidence of pericardial effusion. Mitral Valve: The mitral valve is grossly normal. No evidence of mitral valve regurgitation. No evidence of mitral valve stenosis. Tricuspid Valve: The tricuspid valve is normal in structure. Tricuspid valve  regurgitation is mild to moderate. Aortic Valve: The aortic valve was not well visualized. There is moderate aortic valve annular calcification. Aortic valve regurgitation is trivial. Aortic valve sclerosis is present, with no evidence of aortic valve stenosis. Aortic valve mean gradient measures 3.0 mmHg. Aortic valve peak gradient measures 7.2 mmHg. Aortic valve area, by VTI measures 1.37 cm. Pulmonic Valve: The pulmonic valve was not well visualized. Pulmonic valve regurgitation is not visualized. No evidence of pulmonic stenosis. Aorta: The aortic root is normal in size and structure. Pulmonary Artery: The pulmonary artery is not well seen. Venous: The inferior vena cava is normal in size with less than 50% respiratory variability, suggesting right atrial pressure of 8 mmHg. IAS/Shunts: No atrial level shunt detected by color flow Doppler.  LEFT VENTRICLE PLAX 2D LVIDd:         5.30 cm     Diastology LVIDs:         4.65 cm     LV e' medial:    4.46 cm/s LV PW:         1.00 cm     LV E/e' medial:  14.7 LV IVS:        0.90 cm     LV e' lateral:   10.20 cm/s LVOT diam:     1.80 cm  LV E/e' lateral: 6.4 LV SV:         21 LV SV Index:   9 LVOT Area:     2.54 cm  LV Volumes (MOD) LV vol d, MOD A4C: 35.0 ml LV vol s, MOD A4C: 26.4 ml LV SV MOD A4C:     35.0 ml RIGHT VENTRICLE            IVC RV Basal diam:  4.70 cm    IVC diam: 1.90 cm RV Mid diam:    3.30 cm RV S prime:     7.40 cm/s TAPSE (M-mode): 1.2 cm LEFT ATRIUM             Index        RIGHT ATRIUM           Index LA diam:        4.15 cm 1.89 cm/m   RA Area:     12.40 cm LA Vol (A2C):   67.8 ml 30.94 ml/m  RA Volume:   28.10 ml  12.82 ml/m LA Vol (A4C):   60.9 ml 27.79 ml/m LA Biplane Vol: 66.4 ml 30.30 ml/m  AORTIC VALVE                    PULMONIC VALVE AV Area (Vmax):    1.30 cm     PV Vmax:        0.80 m/s AV Area (Vmean):   1.51 cm     PV Vmean:       62.300 cm/s AV Area (VTI):     1.37 cm     PV VTI:         0.133 m AV Vmax:           134.00  cm/s  PV Peak grad:   2.5 mmHg AV Vmean:          84.500 cm/s  PV Mean grad:   2.0 mmHg AV VTI:            0.150 m      RVOT Peak grad: 2 mmHg AV Peak Grad:      7.2 mmHg AV Mean Grad:      3.0 mmHg LVOT Vmax:         68.70 cm/s LVOT Vmean:        50.100 cm/s LVOT VTI:          0.081 m LVOT/AV VTI ratio: 0.54  AORTA Ao Root diam: 2.50 cm Ao Asc diam:  3.00 cm MITRAL VALVE               TRICUSPID VALVE MV Area (PHT): 8.43 cm    TR Peak grad:   31.6 mmHg MV Decel Time: 90 msec     TR Mean grad:   15.0 mmHg MV E velocity: 65.60 cm/s  TR Vmax:        281.00 cm/s                            TR Vmean:       191.0 cm/s                             SHUNTS                            Systemic VTI:  0.08 m  Systemic Diam: 1.80 cm                            Pulmonic VTI:  0.104 m Lonni Hanson MD Electronically signed by Lonni Hanson MD Signature Date/Time: 07/05/2024/4:30:18 PM    Final    US  ARTERIAL LOWER EXTREMITY DUPLEX RIGHT(NON-ABI) Result Date: 07/04/2024 CLINICAL DATA:  Lower extremity cellulitis and diminished pedal pulses. EXAM: RIGHT LOWER EXTREMITY ARTERIAL DUPLEX SCAN TECHNIQUE: Gray-scale sonography as well as color Doppler and duplex ultrasound was performed to evaluate the lower extremity arteries including the common, superficial and profunda femoral arteries, popliteal artery and calf arteries. COMPARISON:  None Available. FINDINGS: Right lower Extremity Inflow: Normal common femoral arterial waveforms and velocities. No evidence of inflow (aortoiliac) disease. Outflow: Normal profunda femoral, superficial femoral and popliteal arterial waveforms and velocities. No focal elevation of velocity to suggest stenosis. Runoff: Normal anterior tibial waveform and velocity. The posterior tibial artery demonstrates monophasic waveform and is open in the proximal to mid calf. Distal posterior tibial artery appears to be likely occluded. IMPRESSION: 1. No evidence of significant  arterial occlusive disease in the right lower extremity from the common femoral artery through the popliteal artery. 2. 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 Electronically Signed   By: Marcey Moan M.D.   On: 07/04/2024 11:46   CT HEAD WO CONTRAST ( ) Result Date: 07/03/2024 EXAM: CT HEAD WITHOUT CONTRAST 07/03/2024 02:08:01 PM TECHNIQUE: CT of the head was performed without the administration of intravenous contrast. Automated exposure control, iterative reconstruction, and/or weight based adjustment of the mA/kV was utilized to reduce the radiation dose to as low as reasonably achievable. COMPARISON: CT head without contrast 04/18/2023. MR head without and with contrast 10/14/2023. CLINICAL HISTORY: Mental status change, unknown cause. AMS. FINDINGS: BRAIN AND VENTRICLES: No acute hemorrhage. No evidence of acute infarct. No hydrocephalus. No extra-axial collection. No mass effect or midline shift. Periventricular and scattered subcortical white matter hyperattenuation is moderately advanced for age. ORBITS: No acute abnormality. SINUSES: Frothy secretions in left sphenoid sinus, compatible with acute sinusitis. A left gland replacement is noted. SOFT TISSUES AND SKULL: No acute soft tissue abnormality. No skull fracture. IMPRESSION: 1. No acute intracranial abnormality. 2. Moderately advanced periventricular and subcortical white matter changes for age. 3. Acute left sphenoid sinusitis. Electronically signed by: Lonni Necessary MD 07/03/2024 02:28 PM EDT RP Workstation: [id number]   CT ABDOMEN PELVIS WO CONTRAST Result Date: 07/03/2024 CLINICAL DATA:  Provided history: Sepsis EXAM: CT ABDOMEN AND PELVIS WITHOUT CONTRAST TECHNIQUE: Multidetector CT imaging of the abdomen and pelvis was performed following the standard protocol without IV contrast. RADIATION DOSE REDUCTION: This exam was performed according to the departmental  dose-optimization program which includes automated exposure control, adjustment of the mA and/or kV according to patient size and/or use of iterative reconstruction technique. COMPARISON:  CT 10/14/2023 FINDINGS: Lower chest: The heart is upper normal in size. Breathing motion artifact limits parenchymal assessment. Hepatobiliary: Mild hepatic steatosis. Stable cyst in the central liver. The gallbladder is partially distended. More detailed evaluation is obscured by motion at this level. Pancreas: No ductal dilatation or inflammation. Motion artifact through the pancreatic head. Spleen: Normal in size without focal abnormality. Adrenals/Urinary Tract: Normal adrenal glands. Significant motion artifact through the kidneys. No hydronephrosis. Evaluation of previous renal cysts is well as partially exophytic right renal lesion is limited due to motion. No intrarenal air. Partially distended urinary bladder.  There is air in the bladder wall. Stomach/Bowel: Motion limits assessment of bowel loops in the abdomen. Allowing for this, no evidence of obstruction. Moderate colonic stool within the left colon. Scattered colonic diverticula without diverticulitis. Vascular/Lymphatic: Aortic atherosclerosis without aneurysm. Infrarenal IVC filter in place. No definite abdominopelvic adenopathy. Reproductive: Bulky uterus consistent with fibroids, unchanged. No evidence of adnexal mass. Other: No free air or ascites. Postsurgical change of the anterior abdominal wall. Musculoskeletal: The bones are under mineralized. Multilevel degenerative change throughout the lumbar spine with vacuum phenomenon. Mild L3 superior endplate compression fracture is new from prior exam but age indeterminate. There is also a mild L2 superior endplate compression fracture that is new. Prominent right hip arthropathy. Fatty atrophy of gluteal musculature. IMPRESSION: 1. 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 2. Mild L2 and L3  superior endplate compression fractures are new from February exam but age indeterminate. Recommend correlation with symptoms. 3. Colonic diverticulosis without diverticulitis. 4. Mild hepatic steatosis. Aortic Atherosclerosis (ICD10-I70.0) . Electronically Signed   By: Andrea Gasman M.D.   On: 07/03/2024 14:26   DG Chest Port 1 View Result Date: 07/03/2024 CLINICAL DATA:  Questionable sepsis, evaluate for abnormality. EXAM: PORTABLE CHEST 1 VIEW COMPARISON:  Radiographs 04/19/2023 and 09/09/2022.  CT 10/14/2023. FINDINGS: 1039 hours. Patient is rotated to the right. Stable chronic cardiomegaly and aortic ectasia. There is improved aeration of the lung bases and no focal airspace disease, pleural effusion or pneumothorax. Glenohumeral degenerative changes are present bilaterally without acute osseous abnormality. IMPRESSION: No evidence of acute cardiopulmonary process. Improved aeration of the lung bases. Stable cardiomegaly. Electronically Signed   By: Elsie Perone M.D.   On: 07/03/2024 11:06    Microbiology Recent Results (from the past 240 hours)  Culture, blood (Routine X 2) w Reflex to ID Panel     Status: Abnormal (Preliminary result)   Collection Time: 07/13/24 12:45 PM   Specimen: BLOOD  Result Value Ref Range Status   Specimen Description   Final    BLOOD LEFT ANTECUBITAL Performed at [facility], [address] [place], [place] [address]    Special Requests   Final    BOTTLES DRAWN AEROBIC AND ANAEROBIC Blood Culture adequate volume Performed at [facility], [address] [place], [place] [address]    Culture  Setup Time (A)  Final    YEAST AEROBIC BOTTLE ONLY CRITICAL RESULT CALLED TO, READ BACK BY AND VERIFIED WITH: JASON ROBBINS ON 07/15/24 AT 0408 QSD    Culture (A)  Final    CANDIDA ALBICANS Sent to [facility] for further susceptibility testing. Performed at [facility], [address], [place], [place] [address]    Report Status  PENDING  Incomplete  Blood Culture ID Panel (Reflexed)     Status: Abnormal   Collection Time: 07/13/24 12:45 PM  Result Value Ref Range Status   Enterococcus faecalis NOT DETECTED NOT DETECTED Final   Enterococcus Faecium NOT DETECTED NOT DETECTED Final   Listeria monocytogenes NOT DETECTED NOT DETECTED Final   Staphylococcus species NOT DETECTED NOT DETECTED Final   Staphylococcus aureus (BCID) NOT DETECTED NOT DETECTED Final   Staphylococcus epidermidis NOT DETECTED NOT DETECTED Final   Staphylococcus lugdunensis NOT DETECTED NOT DETECTED Final   Streptococcus species NOT DETECTED NOT DETECTED Final   Streptococcus agalactiae NOT DETECTED NOT DETECTED Final   Streptococcus pneumoniae NOT DETECTED NOT DETECTED Final   Streptococcus pyogenes NOT DETECTED NOT DETECTED Final   A.calcoaceticus-baumannii NOT DETECTED NOT  DETECTED Final   Bacteroides fragilis NOT DETECTED NOT DETECTED Final   Enterobacterales NOT DETECTED NOT DETECTED Final   Enterobacter cloacae complex NOT DETECTED NOT DETECTED Final   Escherichia coli NOT DETECTED NOT DETECTED Final   Klebsiella aerogenes NOT DETECTED NOT DETECTED Final   Klebsiella oxytoca NOT DETECTED NOT DETECTED Final   Klebsiella pneumoniae NOT DETECTED NOT DETECTED Final   Proteus species NOT DETECTED NOT DETECTED Final   Salmonella species NOT DETECTED NOT DETECTED Final   Serratia marcescens NOT DETECTED NOT DETECTED Final   Haemophilus influenzae NOT DETECTED NOT DETECTED Final   Neisseria meningitidis NOT DETECTED NOT DETECTED Final   Pseudomonas aeruginosa NOT DETECTED NOT DETECTED Final   Stenotrophomonas maltophilia NOT DETECTED NOT DETECTED Final   Candida albicans DETECTED (A) NOT DETECTED Final    Comment: CRITICAL RESULT CALLED TO, READ BACK BY AND VERIFIED WITH: JASON ROBBINS ON 07/15/24 AT 0408 QSD    Candida auris NOT DETECTED NOT DETECTED Final   Candida glabrata NOT DETECTED NOT DETECTED Final   Candida krusei NOT DETECTED NOT  DETECTED Final   Candida parapsilosis NOT DETECTED NOT DETECTED Final   Candida tropicalis NOT DETECTED NOT DETECTED Final   Cryptococcus neoformans/gattii NOT DETECTED NOT DETECTED Final    Comment: Performed at [facility], [address] [place], [place] [address]  Yeast Susceptibilities     Status: None (Preliminary result)   Collection Time: 07/13/24 12:45 PM  Result Value Ref Range Status   SOURCE PENDING  Incomplete   Organism ID, Yeast Preliminary report  Final    Comment: (NOTE) Specimen has been received and testing has been initiated. Performed At: [facility] [address] [place], [place] [id number] Jennette Shorter MD Ey:[id number]    Amphotericin B MIC PENDING  Incomplete   Anidulafungin MIC PENDING  Incomplete   Caspofungin MIC PENDING  Incomplete   Fluconazole Islt MIC PENDING  Incomplete   ISAVUCONAZOLE MIC PENDING  Incomplete   Itraconazole MIC PENDING  Incomplete   Micafungin MIC PENDING  Incomplete   Posaconazole MIC PENDING  Incomplete   REZAFUNGIN MIC PENDING  Incomplete   Voriconazole MIC PENDING  Incomplete  Culture, blood (Routine X 2) w Reflex to ID Panel     Status: None   Collection Time: 07/13/24 12:55 PM   Specimen: BLOOD  Result Value Ref Range Status   Specimen Description BLOOD BLOOD LEFT ARM  Final   Special Requests   Final    BOTTLES DRAWN AEROBIC AND ANAEROBIC Blood Culture adequate volume   Culture   Final    NO GROWTH 5 DAYS Performed at [facility], [address] [place], [place] [address]    Report Status 07/18/2024 FINAL  Final  Culture, blood (single) w Reflex to ID Panel     Status: None   Collection Time: 07/15/24  4:47 PM   Specimen: BLOOD  Result Value Ref Range Status   Specimen Description BLOOD BLOOD LEFT HAND  Final   Special Requests   Final    BOTTLES DRAWN AEROBIC ONLY Blood Culture results may not be optimal due to an inadequate volume of blood received in culture bottles   Culture    Final    NO GROWTH 5 DAYS Performed at [facility], [address] [place], [place] [address]    Report Status Aug 13, 2024 FINAL  Final  Aerobic Culture w Gram Stain (superficial specimen)     Status: None (Preliminary result)   Collection Time: 07/19/24  6:38 PM   Specimen: Wound  Result Value Ref Range Status   Specimen Description   Final    WOUND Performed at [facility], [address], [place], [place] [address]    Special Requests   Final     LEFT GROIN Performed at [facility], [address] [place], [place] [address]    Gram Stain NO WBC SEEN NO ORGANISMS SEEN   Final   Culture   Final    NO GROWTH < 12 HOURS Performed at [facility], [address], [place], [place] [address]    Report Status PENDING  Incomplete    Lab Basic Metabolic Panel: Recent Labs  Lab 07/15/24 0510 07/16/24 0420 07/17/24 0922 07/18/24 0514 07/19/24 0507 08-19-2024 0602  NA 147* 148* 147* 147* 148* 150*  K 3.8 3.9 4.2 4.2 4.0 4.5  CL 112* 112* 106 104 101 104  CO2 26 28 29  32 36* 39*  GLUCOSE 209* 229* 309* 178* 224* 134*  BUN 69* 65* 60* 62* 65* 61*  CREATININE 0.71 0.49 0.63 0.52 0.51 0.67  CALCIUM  7.6* 7.6* 7.8* 8.0* 7.9* 8.6*  MG 2.2 2.2  --   --   --   --   PHOS 2.5  --  2.7  --   --   --    Liver Function Tests: Recent Labs  Lab 07/19/24 0507 08-19-2024 0602  AST  --  70*  ALT  --  60*  ALKPHOS  --  168*  BILITOT  --  0.4  PROT  --  5.3*  ALBUMIN 1.5* 2.3*   No results for input(s): LIPASE, AMYLASE in the last 168 hours. No results for input(s): AMMONIA in the last 168 hours. CBC: Recent Labs  Lab 07/15/24 0918 07/18/24 0514 07/19/24 0507 08/19/24 0602  WBC 16.1* 11.0* 9.6 10.0  HGB 9.7* 9.2* 8.7* 9.9*  HCT 31.7* 29.3* 28.0* 32.2*  MCV 93.0 91.0 91.5 92.0  PLT 156 201 209 236   Cardiac Enzymes: No results for input(s): CKTOTAL, CKMB, CKMBINDEX, TROPONINI in the last 168 hours. Sepsis Labs: Recent  Labs  Lab 07/15/24 0918 07/18/24 0514 07/19/24 0507 2024-08-19 0602  WBC 16.1* 11.0* 9.6 10.0    Procedures/Operations  None   Magddalene S Tukov-Yual 07/19/2024, 5:39 AM

## 2024-08-08 NOTE — Consult Note (Addendum)
 WOC Nurse Consult Note: patient admitted with multiple wounds (see consults 10/26 and 10/31)  Reason for Consult: reassess multiple wounds  Wound type: 1.  Sacrococcygeal area previous DTPI that now presents as unstageable Pressure Injury with tan necrotic tissue  2.  L posterior leg, L lateral lower leg, L medial thigh full thickness with necrotic black and tan tissue; sloughing skin  3. R ischium/posterior thigh with Stage 3 Pressure Injuries 50% tan 50% red  4.  B buttocks stage 3 Pressure Injury 50% tan 50% red moist  5.  L elbow intact serum filled bulla  6  Linear full thickness underneath L breast 50% pink 50% tan  8. Full thickness under L pannus/abdominal fold 100% pink linear  9  R inner arm full thickness necrotic appearing with dark hemorrhagic tissue unknown etiology  10.  R lower leg and ankle full thickness 80% tan necrotic 20% pink moist  11.  R anterior lower leg full thickness eschar  12.  L medial thigh and L groin full thickness with necrotic tissue  13d.  L heel Deep Tissue Pressure Injury that has evolved to reveal deep red tissue  15.  L dorsal foot and L great toe with sloughing necrotic tissue  Pressure Injury POA: Yes Measurement: see nursing flowsheet  Wound bed:as above  Drainage (amount, consistency, odor) see nursing flowsheet  Periwound: Dressing procedure/placement/frequency:  Cleanse sacrum/coccyx, B buttocks wounds, R posterior thigh wounds with Vashe, do not rinse and allow to air dry. Apply 1/4 thick layer of Santyl to wound bed, top with saline moist gauze, dry gauze and silicone foam or ABD pad and tape whichever is preferred.  Cleanse B thigh and lower leg wounds with Vashe, allow to air dry.  Apply Vashe moistened gauze to wound beds daily (tan, red and eschar) then cover with ABD pads and secure with Kerlix roll gauze wrapped from thighs to ankles.  Cleanse under L breast and L pannus with Vashe, apply silver  hydrofiber to open wound beds daily and  secure with silicone foam or dry gauze and clothe tape whichever works best.  4.  Cleanse L elbow blister and R inner arm wounds with Vashe then cover with Xeroform gauze daily.  Secure with silicone foam or loose kerlix roll gauze whichever is preferred.  5.  Cleanse L medial thigh, L anterior groin, R lower leg and ankle wounds with Vashe then apply  1/4 thick layer of Santyl to wound bed, top with saline moist gauze, dry gauze and silicone foam.  6.  Cleanse L heel, L dorsal foot and L great toe with Vashe, apply Xeroform gauze to wound beds daily and secure with dry gauze and Kerlix roll gauze. Place B feet in Prevalon boots to offload pressure.   POC discussed with bedside nurse.  WOC team will not follow. Re-consult if further needs arise.   Thank you,    Powell Bar MSN, RN-BC, [employer]

## 2024-08-08 NOTE — Consult Note (Signed)
 PHARMACY - ANTICOAGULATION CONSULT NOTE  Pharmacy Consult for heparin  dosing Indication: atrial fibrillation hx DVT  No Known Allergies  Patient Measurements: Height: 5' 6 (167.6 cm) Weight: 128.3 kg (282 lb 13.6 oz) IBW/kg (Calculated) : 59.3 HEPARIN  DW (KG): 84.8  Vital Signs: Temp: 97.3 F (36.3 C) 24-Jul-2024 2000) Temp Source: Axillary 24-Jul-2024 1600) BP: 115/48 Jul 24, 2024 2208) Pulse Rate: 92 07/24/24 2208)  Labs: Recent Labs    07/18/24 0514 07/18/24 1400 07/19/24 0507 07-24-2024 0602 24-Jul-2024 2213  HGB 9.2*  --  8.7* 9.9*  --   HCT 29.3*  --  28.0* 32.2*  --   PLT 201  --  209 236  --   APTT  --    < > 90* 96* 68*  LABPROT  --   --  17.5*  --   --   INR  --   --  1.4*  --   --   HEPARINUNFRC  --   --  >1.10* 0.96*  --   CREATININE 0.52  --  0.51 0.67  --    < > = values in this interval not displayed.    Estimated Creatinine Clearance: 79.5 mL/min (by C-G formula based on SCr of 0.67 mg/dL).   Medical History: Past Medical History:  Diagnosis Date   Abnormality of gait    Acute on chronic diastolic CHF (congestive heart failure) (HCC) 11/20/2017   Acute respiratory failure with hypoxia (HCC) 06/13/2022   AKI (acute kidney injury) 03/03/2022   Breast cancer (HCC) 02/05/2005   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    Breast cancer (HCC) 2019   right breast   Chronic kidney disease, stage 3b (HCC) 09/10/2022   Complication of anesthesia 2006   pt states hard to wake up after L breast surgery    DVT (deep venous thrombosis) (HCC)    a. 03/2023 R PT Vein DVT following abd surgery-->s/p IVC filter.   Edema    GERD (gastroesophageal reflux disease)    Glucose intolerance 09/10/2022   Headache    Heart failure with improved ejection fraction (HFimpEF) (HCC)    a. 07/2011 Echo: EF 20-25%; b. 03/2017 Echo: EF 50-55%; c. 02/2022 Echo: EF 50-55%. mild LVH, GrI DD, Nl RV fxn, mild BAE, mild  MR, mild AoV sclerosis, mild AI.   Heart murmur    Hypertension    Hypertensive emergency 06/13/2022   Lower limb ulcer, calf, left, limited to breakdown of skin (HCC) 12/11/2020   Malignant neoplasm of breast (female), unspecified site 06/14/2018   RIGHT T1b, N0; ER/PR positive, HER-2 negative.   Neuropathy    Obesity    Pain in joint, site unspecified    Personal history of chemotherapy 2006   left breast   Personal history of radiation therapy 2019   right breast   Personal history of radiation therapy 2006   left breast   Pneumonia    Pyuria 08/22/2022   Sleep apnea    Syncope 03/03/2022   Tinea pedis    Unspecified hearing loss    bilateral   Weakness 03/03/2022    Medications:  Last apixaban  dose on 11/9 at 2230  Assessment: Misa Fedorko is a 25 yoF that presented on 10/26 with AMS and hypotension. Past medical history notable for CHF (EF 40-45% 10/21/23), CKD3b, PVD, brain injury, DVT s/p IV filter (03/2023), and malignant neoplasm of Breast s/p chemo and radiation.   Home apixaban  was resumed on 11/6.  IR to place G-tube tentatively on 22-Jul-2024. Anticoagulation plan is to bridge from apixaban  to heparin  gtt and hold heparin  2-4 hours prior to procedure. Post-procedure, resume with apixaban  or heparin .  Currently, patient in and out of atrial fibrillation.   Goal of Therapy:  Heparin  level 0.3-0.7 units/ml aPTT 66-102 seconds Monitor platelets by anticoagulation protocol: Yes   Plan:  11/11@0507 : aPTT 90 sec, therapeutic x 1 / HL >1.1 Jul 22, 2024 0602 aPTT 96 sec, therapeutic x 2 / HL 0.96 07/28/2024 2213 aPTT 69 sec, therapeutic x 1 after restart Continue heparin  infusion rate at 1200 units/hr Check aPTT daily w/ AM labs while therapeutic and heparin  level daily while on heparin  Will continue to dose via aPTT until correlation is seen, then will transition to heparin  level dosin Monitor CBC daily  Rankin [name], PharmD, [facility] 08/04/2024 11:30 PM

## 2024-08-08 NOTE — Progress Notes (Signed)
Date of Admission:  07/03/2024     ID: [name] is a 78 y.o. female  Principal Problem:   Sepsis (HCC) Active Problems:   Lower urinary tract infectious disease   Candidemia (HCC)    Subjective: None available  Medications:   vitamin C   500 mg Per Tube BID   Chlorhexidine  Gluconate Cloth  6 each Topical Daily   [START ON 07/10/2024] dexamethasone   4 mg Per Tube Daily   Followed by   NOREEN ON 07/29/2024] dexamethasone   2 mg Per Tube Daily   dexamethasone   6 mg Per Tube Daily   feeding supplement (PROSource TF20)  60 mL Per Tube Daily   free water  200 mL Per Tube Q2H   furosemide   40 mg Intravenous Q6H   Gerhardt's butt cream   Topical BID   insulin  aspart  0-15 Units Subcutaneous Q4H   insulin  aspart  3 Units Subcutaneous Q4H   metoprolol  tartrate  12.5 mg Per Tube BID   neomycin-bacitracin-polymyxin  1 Application Apply externally QHS   [START ON 07/13/2024] nutrition supplement (JUVEN)  1 packet Per Tube BID BM   mouth rinse  15 mL Mouth Rinse 4 times per day   pantoprazole  (PROTONIX ) IV  40 mg Intravenous Q24H   senna-docusate  1 tablet Per NG tube QHS    Objective: Vital signs in last 24 hours: Patient Vitals for the past 24 hrs:  BP Temp Temp src Pulse Resp SpO2 Weight  26-Jul-2024 1650 -- -- -- 83 14 (!) 87 % --  07/26/2024 1640 -- -- -- 68 13 90 % --  2024/07/26 1630 -- -- -- (!) 56 10 94 % --  07/26/24 1620 -- -- -- 71 13 92 % --  July 26, 2024 1610 -- -- -- 66 13 (!) 85 % --  07-26-24 1600 117/72 97.8 F (36.6 C) Axillary 92 14 94 % --  07/26/2024 1550 -- -- -- 78 15 94 % --  2024-07-26 1540 -- -- -- 70 14 97 % --  07/26/24 1530 134/72 -- -- 80 15 95 % --  2024-07-26 1520 (!) 123/90 -- -- 74 (!) 8 94 % --  26-Jul-2024 1510 -- -- -- (!) 110 (!) 35 98 % --  07-26-2024 1500 (!) 72/46 -- -- -- 13 -- --  07/26/2024 1450 -- -- -- -- 15 -- --  07-26-24 1440 -- -- -- (!) 121 (!) 21 (!) 89 % --  07-26-2024 1430 (!) 81/59 -- -- (!) 106 15 97 % --  07-26-24 1420 -- -- -- (!) 47  14 95 % --  07-26-24 1410 -- -- -- 98 19 93 % --  2024-07-26 1400 120/80 -- -- 96 14 91 % --  July 26, 2024 1350 -- -- -- (!) 102 12 90 % --  07/26/24 1340 -- -- -- (!) 102 18 92 % --  07/26/2024 1330 121/78 -- -- 84 13 95 % --  07-26-2024 1320 -- -- -- 95 14 94 % --  07-26-24 1310 -- -- -- (!) 101 15 94 % --  07/26/24 1300 (!) 124/90 -- -- 93 19 92 % --  07-26-2024 1250 -- -- -- 96 20 95 % --  July 26, 2024 1240 -- -- -- 99 14 93 % --  2024/07/26 1230 115/83 -- -- 94 16 95 % --  07/26/2024 1220 117/80 -- -- 99 13 94 % --  2024/07/26 1210 -- -- -- 64 16 93 % --  2024-07-26 1200 (!) 133/107 97.6 F (36.4  C) Axillary (!) 40 (!) 8 94 % --  07-29-2024 1150 -- -- -- 90 18 94 % --  07/29/24 1140 -- -- -- 100 18 94 % --  July 29, 2024 1130 124/86 -- -- 94 15 94 % --  Jul 29, 2024 1120 -- -- -- 99 19 91 % --  July 29, 2024 1110 -- -- -- 92 18 (!) 88 % --  July 29, 2024 1100 (!) 116/54 -- -- 87 13 90 % --  July 29, 2024 1050 112/73 -- -- 92 10 93 % --  07/29/2024 1040 -- -- -- (!) 25 (!) 7 (!) 78 % --  29-Jul-2024 1030 (!) 108/58 -- -- 72 (!) 9 92 % --  07/29/24 1015 115/88 -- -- 82 -- 91 % --  2024-07-29 1007 120/82 -- -- 87 12 96 % --  07-29-2024 1005 120/82 -- -- 88 13 97 % --  2024-07-29 1002 122/84 -- -- 100 13 100 % --  07/29/24 1000 122/84 -- -- 82 12 99 % --  29-Jul-2024 0957 (!) 121/104 -- -- (!) 106 17 99 % --  2024-07-29 0955 (!) 121/104 -- -- 90 16 99 % --  07/29/2024 0952 132/86 -- -- 69 (!) 21 94 % --  07/29/2024 0950 132/86 -- -- 71 19 94 % --  Jul 29, 2024 0947 (!) 170/93 -- -- 91 (!) 9 99 % --  2024-07-29 0942 (!) 142/79 -- -- (!) 101 14 -- --  07-29-2024 0937 (!) 158/127 -- -- 89 17 -- --  2024-07-29 0936 (!) 158/127 -- -- 90 15 93 % --  July 29, 2024 0933 -- -- -- 100 12 -- --  2024-07-29 0911 101/86 (!) 97.5 F (36.4 C) Temporal (!) 101 16 99 % --  07-29-2024 0830 -- -- -- (!) 54 (!) 25 100 % --  2024-07-29 0820 -- -- -- 65 15 100 % --  29-Jul-2024 0810 -- -- -- 90 (!) 22 100 % --  07-29-2024 0800 102/77 98.4 F (36.9 C) Axillary 68 18 97 % --  07/29/2024  0750 -- -- -- (!) 35 20 100 % --  2024/07/29 0740 -- -- -- 99 16 100 % --  07/29/2024 0730 -- -- -- 89 16 100 % --  07/29/24 0720 -- -- -- 72 18 97 % --  07/29/24 0710 -- -- -- 96 16 100 % --  July 29, 2024 0700 -- -- -- 78 15 100 % --  July 29, 2024 0500 -- -- -- -- -- -- 128.3 kg  07/29/24 0457 115/69 98.6 F (37 C) Axillary 72 20 100 % --  2024/07/29 0200 -- -- -- 62 15 100 % --  07/29/2024 0000 112/68 -- -- 64 19 96 % --  07/19/24 2300 -- -- -- 64 19 99 % --  07/19/24 2200 95/76 -- -- 77 15 97 % --  07/19/24 2000 103/65 99.5 F (37.5 C) Axillary 91 (!) 22 98 % --  07/19/24 1900 126/61 -- -- 83 13 100 % --  07/19/24 1850 -- -- -- 86 14 97 % --  07/19/24 1840 -- -- -- 66 12 100 % --  07/19/24 1830 -- -- -- 67 13 100 % --  07/19/24 1820 -- -- -- 62 18 90 % --  07/19/24 1810 -- -- -- 88 15 100 % --  07/19/24 1800 121/62 -- -- 78 14 100 % --  07/19/24 1750 -- -- -- 85 14 100 % --  07/19/24 1740 -- -- -- 91 13 100 % --       PHYSICAL  EXAM:  General: she moans and utter some words because of pain when being turned Lungs: b/l air entry Heart: irregular Abdomen: Soft, PEG in place Extremities: extremities. Blisters, deroofed wounds                   Skin:petechiae, bruising Lymph: Cervical, supraclavicular normal. Neurologic: cannot be assessed  Lab Results    Latest Ref Rng & Units 08-11-2024    6:02 AM 07/19/2024    5:07 AM 07/18/2024    5:14 AM  CBC  WBC 4.0 - 10.5 K/uL 10.0  9.6  11.0   Hemoglobin 12.0 - 15.0 g/dL 9.9  8.7  9.2   Hematocrit 36.0 - 46.0 % 32.2  28.0  29.3   Platelets 150 - 400 K/uL 236  209  201        Latest Ref Rng & Units 08/11/24    6:02 AM 07/19/2024    5:07 AM 07/18/2024    5:14 AM  CMP  Glucose 70 - 99 mg/dL 865  775  821   BUN 8 - 23 mg/dL 61  65  62   Creatinine 0.44 - 1.00 mg/dL 9.32  9.48  9.47   Sodium 135 - 145 mmol/L 150  148  147   Potassium 3.5 - 5.1 mmol/L 4.5  4.0  4.2   Chloride 98 - 111 mmol/L 104  101  104   CO2 22 -  32 mmol/L 39  36  32   Calcium  8.9 - 10.3 mg/dL 8.6  7.9  8.0   Total Protein 6.5 - 8.1 g/dL 5.3     Total Bilirubin 0.0 - 1.2 mg/dL 0.4     Alkaline Phos 38 - 126 U/L 168     AST 15 - 41 U/L 70     ALT 0 - 44 U/L 60         Microbiology: 07/13/24  Blood culture  routine was candida albicans 07/15/24 BC- NG 07/19/24 wound culture Studies/Results: IR GASTROSTOMY TUBE MOD SED Result Date: 08/11/2024 INDICATION: Altered mental status (acute metabolic encephalopathy). Placement of percutaneous gastrostomy tube for nutritional support. EXAM: Percutaneous gastrostomy tube placement MEDICATIONS: Antibiotics were administered within 1 hour of the procedure. Glucagon 1 mg IV ANESTHESIA/SEDATION: Moderate (conscious) sedation was employed during this procedure. A total of Versed  1 mg and Fentanyl  50 mcg was administered intravenously by the radiology nurse. Total intra-service moderate Sedation Time: 18 minutes. The patient's level of consciousness and vital signs were monitored continuously by radiology nursing throughout the procedure under my direct supervision. CONTRAST:  12 mL Omnipaque  300-administered into the gastric lumen. FLUOROSCOPY: Radiation Exposure Index (as provided by the fluoroscopic device): 53 mGy Kerma COMPLICATIONS: None immediate. PROCEDURE: Informed written consent was obtained from the patient's POA after a thorough discussion of the procedural risks, benefits and alternatives. All questions were addressed. Maximal Sterile Barrier Technique was utilized including caps, mask, sterile gowns, sterile gloves, sterile drape, hand hygiene and skin antiseptic. A timeout was performed prior to the initiation of the procedure. In a supine position the epigastric region was prepped and draped in usual sterile fashion. The patient arrived with an indwelling nasogastric tube which was used to insufflate the stomach with room air. Under fluoroscopic guidance, the skin was marked for gastropexy  suture deployment. Subcutaneous tissue was infiltrated with 1% lidocaine  for local anesthesia. Two gastropexy sutures were deployed in sequential fashion by advancing the pre loaded in needle through a tiny incision in the epigastric region until needle tip  was identified within the stomach under fluoroscopic guidance. Contrast dripped onto the dorsal wall of the stomach verified on fluoroscopy. A larger incision was then made in the epigastric region and a 10 cm Yueh needle was advanced from the skin into the stomach lumen. Intraluminal position was again verified by withdrawing gas into a syringe and tripping contrast onto the dorsal wall of the stomach. Access was exchanged for an Amplatz guidewire which was advanced to the pyloric region under fluoroscopy. An 8 mm x 10 cm balloon was then loaded with the 18 French gastrostomy tube and advanced into the incision over the guidewire. Balloon dilatation of the access site was then performed. The balloon was deflated as the gastric tube was advanced over the catheter and guidewire. Retention balloon inflated with 10 mL normal saline. Balloon catheter and guidewire removed. Dilute contrast injected into the gastrostomy tube demonstrated satisfactory position. The gastric tube was then flushed with normal saline. IMPRESSION: Satisfactory placement of an 50 French percutaneous gastrostomy tube as described above. Gastropexy sutures will be removed in approximately 2-3 weeks. Electronically Signed   By: Cordella Banner   On: 07-23-24 16:29     Assessment/Plan: ESBL ecoli bacteremia with emphysematous cystitis  on admission- treated with 14 days of Carbapenem On a routine blood test that was done to look for clearance of bacteremia on 07/13/24 it came positive for candida albicans 1 of 2 candidemia She had a left  femoral catheter- removed yesterday- pus at the site culture sent The 2 d echo on 10/28 was not an optimal study  We can repeat echo to see whether  valves are free of vegetation Fluconazole for 2 weeks from time of femoral cath removal- 08/01/24  Anemia  B/l extremities edema has resolved  Extensive and wounds lower limbs Wound care consult to see whether silvadene  is better topically  Small superficial wounds sacrum- HSV DNA sent   AFIB on eliquis   Pt seen with her nurses-

## 2024-08-08 NOTE — Progress Notes (Addendum)
 CODE BLUE note:  The patient went into respiratory arrest and was unresponsive but was still having the pulse.  A CODE BLUE was called at 2331 on 24-Jul-2024.  She was immediately bagged.  She was hypotensive and was given IV normal saline bolus wide open and was started on IV Levophed.  She became bradycardic and was given 1 mg of IV atropine as well as 1 mg of IV epinephrine  X3.  She was suctioned with a big chunk of mucous plug that came out and was successfully intubated.  Despite all efforts, she went into cardiac arrest and lost her pulses.  Bedside ultrasound revealed no significant cardiac pumping activity.  Respecting her wishes as well as family wishes to not resuscitate/perform CPR no chest compressions were performed and the patient was pronounced dead at 2351 on 2024/07/24.  We tried to contact the family at the time of the code and could not get in touch with any family members.  I discussed that with her family later on.  Her body will be released to the funeral home of the family's choice.  Death summary and death certificate will be performed and signed by the primary attending during this hospitalization Dr. Laneta Blunt, DO.
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
## 2024-08-08 NOTE — Death Summary Note (Addendum)
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 NOT DETECTED NOT DETECTED Final   Klebsiella oxytoca NOT DETECTED NOT DETECTED Final   Klebsiella pneumoniae NOT DETECTED NOT DETECTED Final   Proteus species NOT DETECTED NOT DETECTED Final   Salmonella species NOT DETECTED NOT DETECTED Final   Serratia marcescens NOT DETECTED NOT DETECTED Final   Haemophilus influenzae NOT DETECTED NOT DETECTED Final   Neisseria meningitidis NOT DETECTED NOT DETECTED Final   Pseudomonas aeruginosa NOT DETECTED NOT DETECTED Final   Stenotrophomonas maltophilia NOT DETECTED NOT DETECTED Final   Candida albicans DETECTED (A) NOT DETECTED Final    Comment: CRITICAL RESULT CALLED TO, READ BACK BY AND VERIFIED WITH: JASON ROBBINS ON 07/15/24 AT 0408 QSD    Candida auris NOT DETECTED NOT DETECTED Final   Candida glabrata NOT DETECTED NOT DETECTED Final   Candida krusei NOT DETECTED NOT DETECTED Final   Candida parapsilosis NOT DETECTED NOT DETECTED Final   Candida tropicalis NOT DETECTED NOT DETECTED Final   Cryptococcus neoformans/gattii NOT DETECTED NOT DETECTED Final    Comment: Performed at [facility], [address] [place], [place] [address]   Yeast Susceptibilities     Status: None (Preliminary result)   Collection Time: 07/13/24 12:45 PM  Result Value Ref Range Status   SOURCE PENDING  Incomplete   Organism ID, Yeast Preliminary report  Final    Comment: (NOTE) Specimen has been received and testing has been initiated. Performed At: [facility] [address] [place], [place] [id number] Jennette Shorter MD Ey:[id number]    Amphotericin B MIC PENDING  Incomplete   Anidulafungin MIC PENDING  Incomplete   Caspofungin MIC PENDING  Incomplete   Fluconazole Islt MIC PENDING  Incomplete   ISAVUCONAZOLE MIC PENDING  Incomplete   Itraconazole MIC PENDING  Incomplete   Micafungin MIC PENDING  Incomplete   Posaconazole MIC PENDING  Incomplete   REZAFUNGIN MIC PENDING  Incomplete   Voriconazole MIC PENDING  Incomplete  Culture, blood (Routine X 2) w Reflex to ID Panel     Status: None   Collection Time: 07/13/24 12:55 PM   Specimen: BLOOD  Result Value Ref Range Status   Specimen Description BLOOD BLOOD LEFT ARM  Final   Special Requests   Final    BOTTLES DRAWN AEROBIC AND ANAEROBIC Blood Culture adequate volume   Culture   Final    NO GROWTH 5 DAYS Performed at [facility], [address], [place], [place] [address]    Report Status 07/18/2024 FINAL  Final  Culture, blood (single) w Reflex to ID Panel     Status: None   Collection Time: 07/15/24  4:47 PM   Specimen: BLOOD  Result Value Ref Range Status   Specimen Description BLOOD BLOOD LEFT HAND  Final   Special Requests   Final    BOTTLES DRAWN AEROBIC ONLY Blood Culture results may not be optimal due to an inadequate volume of blood received in culture bottles   Culture   Final    NO GROWTH 5 DAYS Performed at [facility], [address] [place], [place] [address]    Report Status Jul 23, 2024 FINAL  Final  Aerobic Culture w Gram Stain (superficial specimen)     Status: None (Preliminary result)   Collection Time: 07/19/24  6:38 PM    Specimen: Wound  Result Value Ref Range Status   Specimen Description   Final    WOUND Performed at [facility], [address], [place], [place] [address]    Special Requests   Final     LEFT GROIN Performed at [facility], [address] [place], [place] [address]    Gram Stain NO WBC SEEN NO ORGANISMS SEEN   Final   Culture   Final    NO GROWTH < 12 HOURS Performed at [facility], [address] [place], [place] [address]    Report Status PENDING  Incomplete    Time spent: 30 minutes  Signed: Laneta Blunt, DO 07/28/2024

## 2024-08-08 NOTE — Progress Notes (Signed)
Nutrition Brief Note   DOCUMENTATION CODES:   Obesity unspecified  INTERVENTION:   Continue Osmolite 1.5@65ml /hr + ProSource TF 20- Give 60ml daily via tube  Free water flushes q2 hours   Regimen provides 2420kcal/day, 118g/day protein and 3540ml/day of free water.   Juven Fruit Punch BID via tube, each serving provides 95kcal and 2.5g of protein (amino acids glutamine and arginine)  Vitamin C  500mg  BID via tube  Daily weights   NUTRITION DIAGNOSIS:   Inadequate oral intake related to lethargy/confusion as evidenced by NPO status. -ongoing   GOAL:   Patient will meet greater than or equal to 90% of their needs -met   MONITOR:   Diet advancement, Labs, Weight trends, TF tolerance, I & O's, Skin  ASSESSMENT:   78 y/o female with h/o CHF, mild mitral valve regurgitation, CKD stage IIIb, DVT s/p IV filter (03/2023), PVD, COPD, hiatal hernia, venous insufficiency, GERD, HTN, heart murmur, lymphedema,TIA , obesity, breast cancer s/p mastectomy and chemoradiation with brain metastasis, OSA, dysphagia, SBO (s/p exploratory laparotomy, extensive enterolysis and removal of foreign object 2024), ventral hernia (incarcerated omentum s/p open mesh repair and omental resection 2007) and recent water scald that required treatment at the [facility] (March) and who is now admitted with AMS, septic shock, bateremia and AKI.  -Pt s/p IR G-tube (18 Fr) placement today  Pt s/p G-tube placement today. Will resume tube feeds. Pt with hypervolemic hypernatremia; will resume lasix  and free water. Per chart, pt is weight stable since admission but remains up ~44lbs from her last documented chart weights. Pt +11.9L on her I & Os. Pt continues to have edema but this does appear improved. Palliative care is following.   Medications reviewed and include: vitamin C , dexamethasone , lasix , insulin , protonix , senokot, heparin   Labs reviewed: Na 150(H), K 4.5 wnl, BUN 61(H) Hgb 9.9(L), Hct  32.2(L) Cbgs- 133, 123, 134, 100 x 24 hrs   UOP-   Diet Order:   Diet Order             Diet NPO time specified  Diet effective midnight                  EDUCATION NEEDS:   No education needs have been identified at this time  Skin:  Skin Assessment: Reviewed RN Assessment   -DTI on sacrum- 2 cm x 2 cm x 0.1 cm. -Right hip due friction- 8 cm x 1 cm x 0.1 cm. -Right ankle full thickness (non-pressure ulcer)- 2 cm x 2 cm. -Left heel (non-pressure ulcer).  -Left thigh- 5 cm x 15 cm x 0.1 cm.  Last BM:  July 21, 2024- via rectal tube  Height:   Ht Readings from Last 1 Encounters:  07/03/24 5' 6 (1.676 m)    Weight:   Wt Readings from Last 1 Encounters:  07/13/2024 128.3 kg    Ideal Body Weight:  59 kg  BMI:  Body mass index is 45.65 kg/m.  Estimated Nutritional Needs:   Kcal:  2100-2400kcal/day  Protein:  110-120g/day  Fluid:  1.6-1.8L/day  Augustin Shams MS, RD, LDN If unable to be reached, please send secure chat to RD inpatient available from 8:00a-4:00p daily

## 2024-08-08 NOTE — Consult Note (Signed)
 PHARMACY - ANTICOAGULATION CONSULT NOTE  Pharmacy Consult for heparin  dosing Indication: atrial fibrillation hx DVT  No Known Allergies  Patient Measurements: Height: 5' 6 (167.6 cm) Weight: 128.3 kg (282 lb 13.6 oz) IBW/kg (Calculated) : 59.3 HEPARIN  DW (KG): 84.8  Vital Signs: Temp: 98.6 F (37 C) Aug 01, 2024 0457) Temp Source: Axillary August 01, 2024 0457) BP: 115/69 08/01/24 0457) Pulse Rate: 72 08/01/2024 0457)  Labs: Recent Labs    07/18/24 0514 07/18/24 1400 07/18/24 1957 07/19/24 0507 08-01-24 0602  HGB 9.2*  --   --  8.7* 9.9*  HCT 29.3*  --   --  28.0* 32.2*  PLT 201  --   --  209 236  APTT  --    < > 76* 90* 96*  LABPROT  --   --   --  17.5*  --   INR  --   --   --  1.4*  --   HEPARINUNFRC  --   --   --  >1.10* 0.96*  CREATININE 0.52  --   --  0.51 0.67   < > = values in this interval not displayed.    Estimated Creatinine Clearance: 79.5 mL/min (by C-G formula based on SCr of 0.67 mg/dL).   Medical History: Past Medical History:  Diagnosis Date   Abnormality of gait    Acute on chronic diastolic CHF (congestive heart failure) (HCC) 11/20/2017   Acute respiratory failure with hypoxia (HCC) 06/13/2022   AKI (acute kidney injury) 03/03/2022   Breast cancer (HCC) 02/05/2005   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    Breast cancer (HCC) 2019   right breast   Chronic kidney disease, stage 3b (HCC) 09/10/2022   Complication of anesthesia 2006   pt states hard to wake up after L breast surgery    DVT (deep venous thrombosis) (HCC)    a. 03/2023 R PT Vein DVT following abd surgery-->s/p IVC filter.   Edema    GERD (gastroesophageal reflux disease)    Glucose intolerance 09/10/2022   Headache    Heart failure with improved ejection fraction (HFimpEF) (HCC)    a. 07/2011 Echo: EF 20-25%; b. 03/2017 Echo: EF 50-55%; c. 02/2022 Echo: EF 50-55%. mild LVH, GrI DD, Nl RV fxn, mild BAE, mild MR,  mild AoV sclerosis, mild AI.   Heart murmur    Hypertension    Hypertensive emergency 06/13/2022   Lower limb ulcer, calf, left, limited to breakdown of skin (HCC) 12/11/2020   Malignant neoplasm of breast (female), unspecified site 06/14/2018   RIGHT T1b, N0; ER/PR positive, HER-2 negative.   Neuropathy    Obesity    Pain in joint, site unspecified    Personal history of chemotherapy 2006   left breast   Personal history of radiation therapy 2019   right breast   Personal history of radiation therapy 2006   left breast   Pneumonia    Pyuria 08/22/2022   Sleep apnea    Syncope 03/03/2022   Tinea pedis    Unspecified hearing loss    bilateral   Weakness 03/03/2022    Medications:  Last apixaban  dose on 11/9 at 2230  Assessment: [name] is a 78 yoF that presented on 10/26 with AMS and hypotension. Past medical history notable for CHF (EF 40-45% 10/21/23), CKD3b, PVD, brain injury, DVT s/p IV filter (03/2023), and malignant neoplasm of Breast s/p chemo and radiation.   Home apixaban  was resumed on 11/6.  IR to place G-tube tentatively on 08-04-24. Anticoagulation plan is to bridge from apixaban  to heparin  gtt and hold heparin  2-4 hours prior to procedure. Post-procedure, resume with apixaban  or heparin .  Currently, patient in and out of atrial fibrillation.   Goal of Therapy:  Heparin  level 0.3-0.7 units/ml aPTT 66-102 seconds Monitor platelets by anticoagulation protocol: Yes   Plan:  11/11@0507 : aPTT 90 sec, therapeutic x 1 / HL >1.1 08/04/2024 0602 aPTT 96 sec, therapeutic x 2 / HL 0.96 Continue heparin  infusion rate at 1200 units/hr Check aPTT daily w/ AM labs while therapeutic and heparin  level daily while on heparin  Will continue to dose via aPTT until correlation is seen, then will transition to heparin  level dosin Monitor CBC daily  Rankin [name], PharmD, [facility] 08/04/2024 6:58 AM

## 2024-08-08 NOTE — Progress Notes (Addendum)
 Pt's saO2 dropping into mid 80s. . Other vs Pulse 79  bp 93/56 map 67 resp 14. Placed [place] on pt at 4l. Sats up to 100% MD Mansy notified via secure chat. Acknowledged by Dr. Lawence, no new orders.

## 2024-08-08 NOTE — TOC Progression Note (Signed)
 Transition of Care [facility]) - Progression Note    Patient Details  Name: [name] MRN: [id number] Date of Birth: 09-Apr-1946  Transition of Care [facility]) CM/SW Contact  K'La JINNY Ruts, LCSW Phone Number: 2024/08/06, 1:23 PM  Clinical Narrative:    Chart reviewed. I was able to speak with the provider and the patient health is not doing well. I will follow up with the patient daughter when patient is medically stable.      Barriers to Discharge: Continued Medical Work up               Expected Discharge Plan and Services       Living arrangements for the past 2 months: Skilled Nursing Facility                                       Social Drivers of Health (SDOH) Interventions SDOH Screenings   Food Insecurity: Patient Unable To Answer (07/04/2024)  Housing: Patient Unable To Answer (07/08/2024)  Transportation Needs: Patient Unable To Answer (07/04/2024)  Utilities: Patient Unable To Answer (07/04/2024)  Social Connections: Patient Unable To Answer (07/04/2024)  Tobacco Use: Medium Risk (07/03/2024)    Readmission Risk Interventions    07/05/2024    3:42 PM 04/18/2023   10:48 AM 09/12/2022   11:09 AM  Readmission Risk Prevention Plan  Transportation Screening Complete Complete   PCP or Specialist Appt within 3-5 Days   Complete  Social Work Consult for Recovery Care Planning/Counseling   Complete  Palliative Care Screening   Not Applicable  Medication Review [employer]) Complete Complete   PCP or Specialist appointment within 3-5 days of discharge Complete    HRI or Home Care Consult Complete Complete   SW Recovery Care/Counseling Consult Complete    Palliative Care Screening Not Applicable    Skilled Nursing Facility Complete Complete

## 2024-08-08 DEATH — deceased
